# Patient Record
Sex: Female | Born: 1935 | Race: White | Hispanic: No | Marital: Married | Smoking: Never smoker
Health system: Southern US, Community
[De-identification: ages and names within clinical notes are randomized; demographics above are authoritative.]

## PROBLEM LIST (undated history)

## (undated) DIAGNOSIS — C569 Malignant neoplasm of unspecified ovary: Secondary | ICD-10-CM

## (undated) DIAGNOSIS — E785 Hyperlipidemia, unspecified: Secondary | ICD-10-CM

## (undated) DIAGNOSIS — E559 Vitamin D deficiency, unspecified: Secondary | ICD-10-CM

## (undated) DIAGNOSIS — Z923 Personal history of irradiation: Secondary | ICD-10-CM

## (undated) DIAGNOSIS — I10 Essential (primary) hypertension: Secondary | ICD-10-CM

## (undated) DIAGNOSIS — N189 Chronic kidney disease, unspecified: Secondary | ICD-10-CM

## (undated) DIAGNOSIS — M1990 Unspecified osteoarthritis, unspecified site: Secondary | ICD-10-CM

## (undated) DIAGNOSIS — Z860100 Personal history of colon polyps, unspecified: Secondary | ICD-10-CM

## (undated) DIAGNOSIS — E119 Type 2 diabetes mellitus without complications: Secondary | ICD-10-CM

## (undated) DIAGNOSIS — Z9221 Personal history of antineoplastic chemotherapy: Secondary | ICD-10-CM

## (undated) DIAGNOSIS — D649 Anemia, unspecified: Secondary | ICD-10-CM

## (undated) DIAGNOSIS — K219 Gastro-esophageal reflux disease without esophagitis: Secondary | ICD-10-CM

## (undated) DIAGNOSIS — Z8619 Personal history of other infectious and parasitic diseases: Secondary | ICD-10-CM

## (undated) DIAGNOSIS — Z5189 Encounter for other specified aftercare: Secondary | ICD-10-CM

## (undated) DIAGNOSIS — C50212 Malignant neoplasm of upper-inner quadrant of left female breast: Secondary | ICD-10-CM

## (undated) DIAGNOSIS — M109 Gout, unspecified: Secondary | ICD-10-CM

## (undated) DIAGNOSIS — T7840XA Allergy, unspecified, initial encounter: Secondary | ICD-10-CM

## (undated) HISTORY — DX: Gastro-esophageal reflux disease without esophagitis: K21.9

## (undated) HISTORY — DX: Type 2 diabetes mellitus without complications: E11.9

## (undated) HISTORY — PX: KNEE ARTHROSCOPY: SUR90

## (undated) HISTORY — PX: COLONOSCOPY: SHX174

## (undated) HISTORY — DX: Anemia, unspecified: D64.9

## (undated) HISTORY — DX: Allergy, unspecified, initial encounter: T78.40XA

## (undated) HISTORY — DX: Encounter for other specified aftercare: Z51.89

## (undated) HISTORY — PX: CARDIAC CATHETERIZATION: SHX172

## (undated) HISTORY — DX: Malignant neoplasm of upper-inner quadrant of left female breast: C50.212

## (undated) HISTORY — DX: Vitamin D deficiency, unspecified: E55.9

## (undated) HISTORY — DX: Unspecified osteoarthritis, unspecified site: M19.90

## (undated) HISTORY — DX: Hyperlipidemia, unspecified: E78.5

## (undated) HISTORY — DX: Essential (primary) hypertension: I10

## (undated) HISTORY — DX: Gout, unspecified: M10.9

## (undated) SURGERY — Surgical Case
Anesthesia: *Unknown

---

## 1971-12-02 HISTORY — PX: ABDOMINAL HYSTERECTOMY: SHX81

## 1999-11-05 ENCOUNTER — Encounter: Admission: RE | Admit: 1999-11-05 | Discharge: 1999-11-05 | Payer: Self-pay | Admitting: Internal Medicine

## 1999-11-05 ENCOUNTER — Encounter: Payer: Self-pay | Admitting: Internal Medicine

## 2000-11-05 ENCOUNTER — Encounter: Payer: Self-pay | Admitting: Internal Medicine

## 2000-11-05 ENCOUNTER — Encounter: Admission: RE | Admit: 2000-11-05 | Discharge: 2000-11-05 | Payer: Self-pay | Admitting: Internal Medicine

## 2002-03-28 ENCOUNTER — Encounter: Payer: Self-pay | Admitting: Internal Medicine

## 2002-03-28 ENCOUNTER — Encounter: Admission: RE | Admit: 2002-03-28 | Discharge: 2002-03-28 | Payer: Self-pay | Admitting: Internal Medicine

## 2002-03-31 ENCOUNTER — Encounter: Admission: RE | Admit: 2002-03-31 | Discharge: 2002-03-31 | Payer: Self-pay | Admitting: Internal Medicine

## 2002-03-31 ENCOUNTER — Encounter: Payer: Self-pay | Admitting: Internal Medicine

## 2003-05-16 ENCOUNTER — Encounter: Payer: Self-pay | Admitting: Internal Medicine

## 2003-05-16 ENCOUNTER — Encounter: Admission: RE | Admit: 2003-05-16 | Discharge: 2003-05-16 | Payer: Self-pay | Admitting: Internal Medicine

## 2004-07-10 ENCOUNTER — Encounter: Admission: RE | Admit: 2004-07-10 | Discharge: 2004-07-10 | Payer: Self-pay | Admitting: Internal Medicine

## 2004-12-01 HISTORY — PX: INCONTINENCE SURGERY: SHX676

## 2005-03-03 ENCOUNTER — Observation Stay (HOSPITAL_COMMUNITY): Admission: RE | Admit: 2005-03-03 | Discharge: 2005-03-04 | Payer: Self-pay | Admitting: Urology

## 2005-08-25 ENCOUNTER — Encounter: Admission: RE | Admit: 2005-08-25 | Discharge: 2005-08-25 | Payer: Self-pay | Admitting: Internal Medicine

## 2006-09-07 ENCOUNTER — Encounter: Admission: RE | Admit: 2006-09-07 | Discharge: 2006-09-07 | Payer: Self-pay | Admitting: Internal Medicine

## 2006-09-16 ENCOUNTER — Encounter: Admission: RE | Admit: 2006-09-16 | Discharge: 2006-09-16 | Payer: Self-pay | Admitting: Internal Medicine

## 2006-09-23 ENCOUNTER — Encounter: Admission: RE | Admit: 2006-09-23 | Discharge: 2006-09-23 | Payer: Self-pay | Admitting: Otolaryngology

## 2007-10-20 ENCOUNTER — Encounter: Admission: RE | Admit: 2007-10-20 | Discharge: 2007-10-20 | Payer: Self-pay | Admitting: Internal Medicine

## 2008-10-23 ENCOUNTER — Encounter: Admission: RE | Admit: 2008-10-23 | Discharge: 2008-10-23 | Payer: Self-pay | Admitting: Internal Medicine

## 2009-08-06 ENCOUNTER — Emergency Department (HOSPITAL_COMMUNITY): Admission: EM | Admit: 2009-08-06 | Discharge: 2009-08-06 | Payer: Self-pay | Admitting: Emergency Medicine

## 2009-08-20 ENCOUNTER — Ambulatory Visit (HOSPITAL_COMMUNITY): Admission: RE | Admit: 2009-08-20 | Discharge: 2009-08-20 | Payer: Self-pay | Admitting: Internal Medicine

## 2009-10-29 ENCOUNTER — Encounter: Admission: RE | Admit: 2009-10-29 | Discharge: 2009-10-29 | Payer: Self-pay | Admitting: Internal Medicine

## 2010-10-30 ENCOUNTER — Encounter: Admission: RE | Admit: 2010-10-30 | Discharge: 2010-10-30 | Payer: Self-pay | Admitting: Internal Medicine

## 2010-11-06 ENCOUNTER — Encounter
Admission: RE | Admit: 2010-11-06 | Discharge: 2010-11-06 | Payer: Self-pay | Source: Home / Self Care | Admitting: Internal Medicine

## 2010-12-01 HISTORY — PX: HAND SURGERY: SHX662

## 2011-02-10 ENCOUNTER — Other Ambulatory Visit (HOSPITAL_COMMUNITY): Payer: Self-pay | Admitting: Internal Medicine

## 2011-02-10 ENCOUNTER — Ambulatory Visit (HOSPITAL_COMMUNITY)
Admission: RE | Admit: 2011-02-10 | Discharge: 2011-02-10 | Disposition: A | Discharge status: Home / Self Care | Payer: Federal, State, Local not specified - PPO | Source: Ambulatory Visit | Attending: Internal Medicine | Admitting: Internal Medicine

## 2011-02-10 DIAGNOSIS — M79609 Pain in unspecified limb: Secondary | ICD-10-CM

## 2011-02-14 ENCOUNTER — Encounter (HOSPITAL_BASED_OUTPATIENT_CLINIC_OR_DEPARTMENT_OTHER)
Admission: RE | Admit: 2011-02-14 | Discharge: 2011-02-14 | Disposition: A | Discharge status: Home / Self Care | Payer: Federal, State, Local not specified - PPO | Source: Ambulatory Visit | Attending: Orthopedic Surgery | Admitting: Orthopedic Surgery

## 2011-02-14 ENCOUNTER — Ambulatory Visit
Admission: RE | Admit: 2011-02-14 | Discharge: 2011-02-14 | Disposition: A | Discharge status: Home / Self Care | Payer: Federal, State, Local not specified - PPO | Source: Ambulatory Visit | Attending: Orthopedic Surgery | Admitting: Orthopedic Surgery

## 2011-02-14 ENCOUNTER — Other Ambulatory Visit: Payer: Self-pay | Admitting: Orthopedic Surgery

## 2011-02-14 DIAGNOSIS — Z01811 Encounter for preprocedural respiratory examination: Secondary | ICD-10-CM

## 2011-02-14 LAB — BASIC METABOLIC PANEL
BUN: 23 mg/dL (ref 6–23)
CO2: 28 mEq/L (ref 19–32)
Chloride: 104 mEq/L (ref 96–112)
Glucose, Bld: 94 mg/dL (ref 70–99)
Potassium: 4.8 mEq/L (ref 3.5–5.1)

## 2011-02-17 ENCOUNTER — Ambulatory Visit (HOSPITAL_BASED_OUTPATIENT_CLINIC_OR_DEPARTMENT_OTHER)
Admission: RE | Admit: 2011-02-17 | Discharge: 2011-02-17 | Disposition: A | Discharge status: Home / Self Care | Payer: Federal, State, Local not specified - PPO | Source: Ambulatory Visit | Attending: Orthopedic Surgery | Admitting: Orthopedic Surgery

## 2011-02-17 DIAGNOSIS — Y92009 Unspecified place in unspecified non-institutional (private) residence as the place of occurrence of the external cause: Secondary | ICD-10-CM | POA: Insufficient documentation

## 2011-02-17 DIAGNOSIS — Z01812 Encounter for preprocedural laboratory examination: Secondary | ICD-10-CM | POA: Insufficient documentation

## 2011-02-17 DIAGNOSIS — X58XXXA Exposure to other specified factors, initial encounter: Secondary | ICD-10-CM | POA: Insufficient documentation

## 2011-02-17 DIAGNOSIS — Z01818 Encounter for other preprocedural examination: Secondary | ICD-10-CM | POA: Insufficient documentation

## 2011-02-17 DIAGNOSIS — S63659A Sprain of metacarpophalangeal joint of unspecified finger, initial encounter: Secondary | ICD-10-CM | POA: Insufficient documentation

## 2011-02-17 DIAGNOSIS — Y998 Other external cause status: Secondary | ICD-10-CM | POA: Insufficient documentation

## 2011-04-18 NOTE — Op Note (Signed)
NAME:  Kayla Baker, Kayla Baker                   ACCOUNT NO.:  [id number]   MEDICAL RECORD NO.:  [id number]          PATIENT TYPE:  AMB   LOCATION:  DAY                          FACILITY:  [facility]   PHYSICIAN:  Lucina Mellow. Terance Hart, M.D.DATE OF BIRTH:  10/13/1936   DATE OF PROCEDURE:  03/03/2005  DATE OF DISCHARGE:                                 OPERATIVE REPORT   HISTORY:  This 75 year old lady was brought the OR today for evaluation of  microhematuria and treatment of stress incontinence.  She has had some  microhematuria, and a renal ultrasound was unremarkable and cystoscopy was  also normal.  She has had a hysterectomy before and has classic stress  incontinence and was brought to the OR today for insertion of an obturator  sling, having been advised about the risks of retention, failure to correct  her incontinence, erosion, infection or bladder injury.   PROCEDURE:  Cystoscopy, bilateral retrograde pyelograms and insertion of  obturator sling.   SURGEON:  Lucina Mellow. Terance Hart, M.D.   ANESTHESIA:  General.   PROCEDURE:  The patient was brought to the operating room, placed lithotomy  position, external genitalia and lower abdomen and vaginal canal prepped and  draped in the usual fashion.  She was cystoscoped.  The bladder was totally  normal.  Bilateral retrograde pyelograms obtained using the cone-tip  catheter, and there was slight fullness in the left calyces, a little  __________ caliceal extravasation in the lower pole with the ureters  bilaterally fine, and there were no filling defects in either pyelocaliceal  system indicative of TCC.  Foley catheter was then inserted and the vaginal  tissues suburethrally were injected with Xylocaine with epinephrine.  A  midline incision was made the midurethra and dissected out laterally with  the Strully scissors to the endopelvic fascia.  Stab wounds were made at the  level of the clitoris laterally, through which subsequently passed the  curved  needles behind the obturator bone and through the endopelvic fascia  at the level of midurethra.  [facility] sling was brought up on  the left side without problems and then the corresponding curved needles  stick was made on the right side, and then she was cystoscoped.  There was  no evidence of bladder or urethral injury.  The sling was then brought up on  the right side and brought into position, and the blue tab was cut and the  sling covers were slid off without difficulty.  At this point the sling was  at the level of midurethra and a hemostat easily fit between the sling and  the urethra with the Foley catheter in place.  The ends of the sling were  cut off at skin level and they retracted, and these areas were later closed  with Dermabond.  The wound was irrigated with a double antibiotic solution.  At this point  the vaginal incision was closed with running 2-0 Vicryl.  An Estrace-soaked  vaginal pack was inserted, the Foley catheter was connected to closed  drainage.  She was taken to the recovery  room in good condition with the  correct sponge, needle and instrument count and estimated blood loss of less  than 25 mL.  She tolerated the procedure well and was taken to recovery room  in good condition.      LJP/MEDQ  D:  03/03/2005  T:  03/03/2005  Job:  [id number]   cc:   [name], M.D.  [address] [place]  [place], [place] [address]  Fax: [phone]

## 2011-06-20 NOTE — Op Note (Signed)
NAME:  Kayla Baker, Kayla Baker                   ACCOUNT NO.:  [id number]  MEDICAL RECORD NO.:  [id number]           PATIENT TYPE:  LOCATION:                                 FACILITY:  PHYSICIAN:  Daryll Brod, M.D.       DATE OF BIRTH:  21-Jun-1936  DATE OF PROCEDURE:  02/17/2011 DATE OF DISCHARGE:                              OPERATIVE REPORT   PREOPERATIVE DIAGNOSIS:  Subluxation extensor hood left middle finger.  POSTOPERATIVE DIAGNOSIS:  Subluxation extensor hood left middle finger.  OPERATION:  Centralization extensor hood, left middle finger.  SURGEON:  Daryll Brod, MD  ANESTHESIA:  General.  ANESTHESIOLOGIST:  [place], MD  HISTORY:  The patient is a 75 year old female with a history of injury to her left middle finger plucking something off the tip of her finger. She has complained of a pop, the inability to extend her finger.  She has subluxation of the extensor hood into the ulnar gutter.  She is admitted for reconstruction.  Pre, peri, and postoperative course have been discussed along with risks and complications.  She is aware that there is no guarantee with surgery, possibility of infection, recurrence injury to arteries, nerves, tendons, incomplete relief of symptoms, and dystrophy.  In the preoperative area, the patient is seen.  The extremity marked by both the patient and surgeon.  Antibiotic given.  PROCEDURE:  The patient was brought to the operating room where general anesthetic was carried out without difficulty.  She was prepped using ChloraPrep, supine position, left arm free.  A 3-minute dry time was allowed.  Time-out taken confirming the patient and procedure.  The limb was exsanguinated with an Esmarch bandage, tourniquet placed high and the arm was inflated to 250 mmHg.  A curvilinear incision was made apex radial over the metacarpophalangeal joint the left middle finger carried down through subcutaneous tissue.  Bleeders were electrocauterized  with bipolar.  Neurovascular structure was identified and protected.  The rupture of the sagittal fibers was immediately apparent.  The tendon was subluxated ulnarly.  Half of the juncture to the ring finger was then harvested along with a portion of the ulnar extensor tendon back to the level of the sagittal fiber rupture.  This was then passed through the extensor tendon and woven through the hood, sutured into position after irrigation with interrupted 4-0 Mersilene sutures.  The hood rupture itself was then repaired to the extensor tendon again with figure-of- eight 4-0 Mersilene sutures.  This maintained the finger and extensor tendon directly over the metacarpal head in full flexion and full extension.  The wound was copiously irrigated with saline.  The skin was then closed with a subcuticular 4-0 Vicryl Rapide suture.  A sterile compressive dressing splint to the wrist and fingers was applied.  On deflation of the tourniquet, all fingers immediately pinked.  She was taken to the recovery room for observation in satisfactory condition.  She will be discharged home to return to the [place] in 1 week on Vicodin.          ______________________________ Daryll Brod, M.D.  GK/MEDQ  D:  02/17/2011  T:  02/18/2011  Job:  [id number]  Electronically Signed by Daryll Brod M.D. on 06/20/2011 09:03:55 AM

## 2012-10-15 ENCOUNTER — Other Ambulatory Visit: Payer: Self-pay | Admitting: Internal Medicine

## 2012-10-15 DIAGNOSIS — Z1231 Encounter for screening mammogram for malignant neoplasm of breast: Secondary | ICD-10-CM

## 2012-11-16 ENCOUNTER — Ambulatory Visit: Payer: Federal, State, Local not specified - PPO

## 2012-12-01 HISTORY — PX: CATARACT EXTRACTION, BILATERAL: SHX1313

## 2012-12-08 ENCOUNTER — Ambulatory Visit
Admission: RE | Admit: 2012-12-08 | Discharge: 2012-12-08 | Disposition: A | Discharge status: Home / Self Care | Payer: Medicare Other | Source: Ambulatory Visit | Attending: Internal Medicine | Admitting: Internal Medicine

## 2012-12-08 DIAGNOSIS — Z1231 Encounter for screening mammogram for malignant neoplasm of breast: Secondary | ICD-10-CM

## 2012-12-08 LAB — HM MAMMOGRAPHY: HM Mammogram: NORMAL

## 2012-12-09 ENCOUNTER — Encounter: Payer: Self-pay | Admitting: Gastroenterology

## 2013-01-06 ENCOUNTER — Encounter: Payer: Self-pay | Admitting: Gastroenterology

## 2013-02-17 ENCOUNTER — Encounter: Payer: Medicare Other | Admitting: Gastroenterology

## 2013-03-17 ENCOUNTER — Ambulatory Visit (AMBULATORY_SURGERY_CENTER): Payer: Medicare Other | Admitting: *Deleted

## 2013-03-17 ENCOUNTER — Encounter: Payer: Self-pay | Admitting: Gastroenterology

## 2013-03-17 VITALS — Ht 64.0 in | Wt 150.0 lb

## 2013-03-17 DIAGNOSIS — Z1211 Encounter for screening for malignant neoplasm of colon: Secondary | ICD-10-CM

## 2013-03-17 MED ORDER — NA SULFATE-K SULFATE-MG SULF 17.5-3.13-1.6 GM/177ML PO SOLN: ORAL | Status: DC

## 2013-03-31 ENCOUNTER — Encounter: Payer: Self-pay | Admitting: Gastroenterology

## 2013-03-31 ENCOUNTER — Ambulatory Visit (AMBULATORY_SURGERY_CENTER): Payer: Medicare Other | Admitting: Gastroenterology

## 2013-03-31 ENCOUNTER — Other Ambulatory Visit: Payer: Self-pay | Admitting: Gastroenterology

## 2013-03-31 VITALS — BP 128/62 | HR 78 | Temp 97.5°F | Resp 16 | Ht 64.0 in | Wt 150.0 lb

## 2013-03-31 DIAGNOSIS — D126 Benign neoplasm of colon, unspecified: Secondary | ICD-10-CM

## 2013-03-31 DIAGNOSIS — K5730 Diverticulosis of large intestine without perforation or abscess without bleeding: Secondary | ICD-10-CM

## 2013-03-31 DIAGNOSIS — Z1211 Encounter for screening for malignant neoplasm of colon: Secondary | ICD-10-CM

## 2013-03-31 LAB — GLUCOSE, CAPILLARY: Glucose-Capillary: 92 mg/dL (ref 70–99)

## 2013-03-31 LAB — HM COLONOSCOPY

## 2013-03-31 MED ORDER — SODIUM CHLORIDE 0.9 % IV SOLN: 500.0000 mL | INTRAVENOUS | Status: DC

## 2013-03-31 NOTE — Patient Instructions (Addendum)
Discharge instructions given with verbal understanding. Handout on polyps and diverticulosis given. Resume previous medications. YOU HAD AN ENDOSCOPIC PROCEDURE TODAY AT [facility]: Refer to the procedure report that was given to you for any specific questions about what was found during the examination.  If the procedure report does not answer your questions, please call your gastroenterologist to clarify.  If you requested that your care partner not be given the details of your procedure findings, then the procedure report has been included in a sealed envelope for you to review at your convenience later.  YOU SHOULD EXPECT: Some feelings of bloating in the abdomen. Passage of more gas than usual.  Walking can help get rid of the air that was put into your GI tract during the procedure and reduce the bloating. If you had a lower endoscopy (such as a colonoscopy or flexible sigmoidoscopy) you may notice spotting of blood in your stool or on the toilet paper. If you underwent a bowel prep for your procedure, then you may not have a normal bowel movement for a few days.  DIET: Your first meal following the procedure should be a light meal and then it is ok to progress to your normal diet.  A half-sandwich or bowl of soup is an example of a good first meal.  Heavy or fried foods are harder to digest and may make you feel nauseous or bloated.  Likewise meals heavy in dairy and vegetables can cause extra gas to form and this can also increase the bloating.  Drink plenty of fluids but you should avoid alcoholic beverages for 24 hours.  ACTIVITY: Your care partner should take you home directly after the procedure.  You should plan to take it easy, moving slowly for the rest of the day.  You can resume normal activity the day after the procedure however you should NOT DRIVE or use heavy machinery for 24 hours (because of the sedation medicines used during the test).    SYMPTOMS TO REPORT  IMMEDIATELY: A gastroenterologist can be reached at any hour.  During normal business hours, 8:30 AM to 5:00 PM Monday through Friday, call [phone].  After hours and on weekends, please call the GI answering service at [phone] who will take a message and have the physician on call contact you.   Following lower endoscopy (colonoscopy or flexible sigmoidoscopy):  Excessive amounts of blood in the stool  Significant tenderness or worsening of abdominal pains  Swelling of the abdomen that is new, acute  Fever of 100F or higher  FOLLOW UP: If any biopsies were taken you will be contacted by phone or by letter within the next 1-3 weeks.  Call your gastroenterologist if you have not heard about the biopsies in 3 weeks.  Our staff will call the home number listed on your records the next business day following your procedure to check on you and address any questions or concerns that you may have at that time regarding the information given to you following your procedure. This is a courtesy call and so if there is no answer at the home number and we have not heard from you through the emergency physician on call, we will assume that you have returned to your regular daily activities without incident.  SIGNATURES/CONFIDENTIALITY: You and/or your care partner have signed paperwork which will be entered into your electronic medical record.  These signatures attest to the fact that that the information above on your After Visit  Summary has been reviewed and is understood.  Full responsibility of the confidentiality of this discharge information lies with you and/or your care-partner.

## 2013-03-31 NOTE — Progress Notes (Signed)
Stable to RR 

## 2013-03-31 NOTE — Progress Notes (Signed)
Called to room to assist during endoscopic procedure.  Patient ID and intended procedure confirmed with present staff. Received instructions for my participation in the procedure from the performing physician.  

## 2013-03-31 NOTE — Progress Notes (Signed)
Patient did not experience any of the following events: a burn prior to discharge; a fall within the facility; wrong site/side/patient/procedure/implant event; or a hospital transfer or hospital admission upon discharge from the facility. (G8907) Patient did not have preoperative order for IV antibiotic SSI prophylaxis. (G8918)  

## 2013-03-31 NOTE — Op Note (Signed)
[place]  [address] [place], [id number]   COLONOSCOPY PROCEDURE REPORT  PATIENT: Kayla Baker, Kayla Baker  MR#: [id number] BIRTHDATE: 07-26-1936 , 76  yrs. old GENDER: Female ENDOSCOPIST: Inda Castle, MD REFERRED BY: PROCEDURE DATE:  03/31/2013 PROCEDURE:   Colonoscopy with snare polypectomy ASA CLASS:   Class II INDICATIONS: MEDICATIONS: MAC sedation, administered by CRNA, Propofol (Diprivan), and Propofol (Diprivan) 120 mcg IV  DESCRIPTION OF PROCEDURE:   After the risks benefits and alternatives of the procedure were thoroughly explained, informed consent was obtained.  A digital rectal exam revealed external hemorrhoids.   The LB [id number]  endoscope was introduced through the anus and advanced to the cecum, which was identified by both the appendix and ileocecal valve. No adverse events experienced.   The quality of the prep was excellent using Suprep The instrument was then slowly withdrawn as the colon was fully examined.      COLON FINDINGS: A sessile polyp measuring 6 mm in size was found in the ascending colon.  A polypectomy was performed with a cold snare.  The resection was complete and the polyp tissue was completely retrieved.   Mild diverticulosis was noted.   The colon mucosa was otherwise normal.  Retroflexed views revealed no abnormalities. The time to cecum=3 minutes 57 seconds.  Withdrawal time=8 minutes 25 seconds.  The scope was withdrawn and the procedure completed. COMPLICATIONS: There were no complications.  ENDOSCOPIC IMPRESSION: 1.   Sessile polyp measuring 6 mm in size was found in the ascending colon; polypectomy was performed with a cold snare 2.   Mild diverticulosis was noted 3.   The colon mucosa was otherwise normal  RECOMMENDATIONS: If the polyp(s) removed today are proven to be adenomatous (pre-cancerous) polyps, you will need a repeat colonoscopy in 5 years.  Otherwise you should continue to follow  colorectal cancer screening guidelines for "routine risk" patients with colonoscopy in 10 years.  You will receive a letter within 1-2 weeks with the results of your biopsy as well as final recommendations.  Please call my office if you have not received a letter after 3 weeks.   eSigned:  Inda Castle, MD 03/31/2013 10:34 AM   cc:

## 2013-04-01 ENCOUNTER — Telehealth: Payer: Self-pay | Admitting: *Deleted

## 2013-04-01 NOTE — Telephone Encounter (Signed)
  Follow up Call-  Call back number 03/31/2013  Post procedure Call Back phone  # [phone]  Permission to leave phone message Yes     Patient questions:  Do you have a fever, pain , or abdominal swelling? no Pain Score  0 *  Have you tolerated food without any problems? yes  Have you been able to return to your normal activities? yes  Do you have any questions about your discharge instructions: Diet   no Medications  no Follow up visit  no  Do you have questions or concerns about your Care? no  Actions: * If pain score is 4 or above: No action needed, pain <4.

## 2013-04-08 ENCOUNTER — Encounter: Payer: Self-pay | Admitting: Gastroenterology

## 2013-10-21 ENCOUNTER — Other Ambulatory Visit: Payer: Self-pay | Admitting: Internal Medicine

## 2013-11-03 ENCOUNTER — Encounter: Payer: Self-pay | Admitting: Internal Medicine

## 2013-11-10 ENCOUNTER — Encounter: Payer: Self-pay | Admitting: Internal Medicine

## 2013-11-10 ENCOUNTER — Ambulatory Visit (INDEPENDENT_AMBULATORY_CARE_PROVIDER_SITE_OTHER): Payer: Medicare Other | Admitting: Internal Medicine

## 2013-11-10 VITALS — BP 134/84 | HR 80 | Temp 97.2°F | Resp 18 | Ht 63.75 in | Wt 147.0 lb

## 2013-11-10 DIAGNOSIS — M109 Gout, unspecified: Secondary | ICD-10-CM

## 2013-11-10 DIAGNOSIS — E1129 Type 2 diabetes mellitus with other diabetic kidney complication: Secondary | ICD-10-CM | POA: Insufficient documentation

## 2013-11-10 DIAGNOSIS — E119 Type 2 diabetes mellitus without complications: Secondary | ICD-10-CM | POA: Insufficient documentation

## 2013-11-10 DIAGNOSIS — Z1212 Encounter for screening for malignant neoplasm of rectum: Secondary | ICD-10-CM

## 2013-11-10 DIAGNOSIS — R7309 Other abnormal glucose: Secondary | ICD-10-CM

## 2013-11-10 DIAGNOSIS — I10 Essential (primary) hypertension: Secondary | ICD-10-CM

## 2013-11-10 DIAGNOSIS — E782 Mixed hyperlipidemia: Secondary | ICD-10-CM

## 2013-11-10 DIAGNOSIS — E559 Vitamin D deficiency, unspecified: Secondary | ICD-10-CM

## 2013-11-10 DIAGNOSIS — Z79899 Other long term (current) drug therapy: Secondary | ICD-10-CM

## 2013-11-10 DIAGNOSIS — E1169 Type 2 diabetes mellitus with other specified complication: Secondary | ICD-10-CM | POA: Insufficient documentation

## 2013-11-10 LAB — HEPATIC FUNCTION PANEL
ALT: 28 U/L (ref 0–35)
AST: 23 U/L (ref 0–37)
Alkaline Phosphatase: 122 U/L — ABNORMAL HIGH (ref 39–117)
Bilirubin, Direct: 0.1 mg/dL (ref 0.0–0.3)
Total Bilirubin: 0.6 mg/dL (ref 0.3–1.2)
Total Protein: 7.1 g/dL (ref 6.0–8.3)

## 2013-11-10 LAB — LIPID PANEL
Cholesterol: 155 mg/dL (ref 0–200)
LDL Cholesterol: 75 mg/dL (ref 0–99)
Total CHOL/HDL Ratio: 2.8 Ratio
VLDL: 24 mg/dL (ref 0–40)

## 2013-11-10 LAB — CBC WITH DIFFERENTIAL/PLATELET
Basophils Relative: 1 % (ref 0–1)
Eosinophils Absolute: 0.3 10*3/uL (ref 0.0–0.7)
Eosinophils Relative: 4 % (ref 0–5)
HCT: 39 % (ref 36.0–46.0)
Hemoglobin: 13.3 g/dL (ref 12.0–15.0)
Lymphs Abs: 2.9 10*3/uL (ref 0.7–4.0)
MCH: 30 pg (ref 26.0–34.0)
MCHC: 34.1 g/dL (ref 30.0–36.0)
MCV: 87.8 fL (ref 78.0–100.0)
Monocytes Absolute: 0.6 10*3/uL (ref 0.1–1.0)
Monocytes Relative: 9 % (ref 3–12)
Neutrophils Relative %: 45 % (ref 43–77)
RBC: 4.44 MIL/uL (ref 3.87–5.11)
RDW: 14.9 % (ref 11.5–15.5)
WBC: 7.1 10*3/uL (ref 4.0–10.5)

## 2013-11-10 LAB — HEMOGLOBIN A1C
Hgb A1c MFr Bld: 6.3 % — ABNORMAL HIGH (ref <5.7)
Mean Plasma Glucose: 134 mg/dL — ABNORMAL HIGH (ref <117)

## 2013-11-10 LAB — BASIC METABOLIC PANEL WITH GFR
BUN: 25 mg/dL — ABNORMAL HIGH (ref 6–23)
CO2: 31 mEq/L (ref 19–32)
Calcium: 10.5 mg/dL (ref 8.4–10.5)
GFR, Est African American: 50 mL/min — ABNORMAL LOW
Glucose, Bld: 102 mg/dL — ABNORMAL HIGH (ref 70–99)
Potassium: 4.6 mEq/L (ref 3.5–5.3)
Sodium: 139 mEq/L (ref 135–145)

## 2013-11-10 LAB — TSH: TSH: 3.275 u[IU]/mL (ref 0.350–4.500)

## 2013-11-10 NOTE — Progress Notes (Signed)
Patient ID: Kayla Baker, female   DOB: May 17, 1936, 77 y.o.   MRN: [id number]  Annual Screening Comprehensive Examination  This very nice 77 yo MWF presents for complete physical.  Patient has been followed for HTN (2000), Prediabetes (10/2010), Gout, Hyperlipidemia, and Vitamin D Deficiency.   Patient's BP has been controlled at home. Patient denies any cardiac symptoms as chest pain, palpitations, shortness of breath, dizziness or ankle swelling.   Patient's hyperlipidemia is controlled with diet and medications. Patient denies myalgias or other medication SE's. Last cholesterol last visit was 175, triglycerides  224, HDL 51 and LDL   79 in September - at goal.     Patient has prediabetes predating since December 2011 with last A1c 6.7% in September. Patient denies reactive hypoglycemic symptoms, visual blurring, diabetic polys, or paresthesias.    Finally, patient has history of Vitamin D Deficiency with last vitamin D 71 in June.     Current Outpatient Prescriptions on File Prior to Visit  Medication Sig Dispense Refill  . allopurinol (ZYLOPRIM) 300 MG tablet Take 300 mg by mouth daily. Takes 1/2 tablet daily      . amitriptyline (ELAVIL) 10 MG tablet Take 10 mg by mouth at bedtime. Takes 2 tablets at bedtime      . aspirin 81 MG tablet Take 81 mg by mouth daily.      Marland Kitchen atorvastatin (LIPITOR) 40 MG tablet Take 40 mg by mouth daily.      . cholecalciferol (VITAMIN D) 1000 UNITS tablet Take 1,000 Units by mouth daily.      . enalapril (VASOTEC) 20 MG tablet Take 20 mg by mouth daily.      Marland Kitchen estradiol (ESTRACE) 1 MG tablet Take 1 mg by mouth daily. Takes 1/2 tablet daily      . fluticasone (FLONASE) 50 MCG/ACT nasal spray       . hydrochlorothiazide (HYDRODIURIL) 25 MG tablet TAKE 1 TABLET BY MOUTH DAILY  100 tablet  1  . Magnesium 250 MG TABS Take by mouth. Takes 1 tablet in am and 2 tablets at bedtime      . Multiple Vitamin (MULTIVITAMIN) tablet Take 1 tablet by mouth daily.      .  Omega-3 Fatty Acids (FISH OIL) 1200 MG CAPS Take by mouth daily. Takes 2 capsules daily      . omeprazole (PRILOSEC) 40 MG capsule Take 40 mg by mouth daily.      . vitamin B-12 (CYANOCOBALAMIN) 1000 MCG tablet Take 1,000 mcg by mouth daily. Takes 1 tablet every other day      . vitamin C (ASCORBIC ACID) 500 MG tablet Take 500 mg by mouth daily. Takes 2 tablets daily         Allergies  Allergen Reactions  . Keflex [Cephalexin] Swelling    Tongue and throat swells  . Meloxicam   . Prednisone     High doses  . Premarin [Estrogens Conjugated]   . Trovan [Alatrofloxacin]     Past Medical History  Diagnosis Date  . Hypertension   . Hyperlipidemia   . GERD (gastroesophageal reflux disease)   . Gout   . Allergy   . Diet-controlled type 2 diabetes mellitus   . Vitamin D deficiency   . Anemia     Past Surgical History  Procedure Laterality Date  . Cataract extraction, bilateral  2014    right eye 2/14; left eye 3/3  . Abdominal hysterectomy/App w/o BSO  1979  . Incontinence surgery  2006  .  Knee arthroscopy      [id number] right  . Hand surgery  2012    left    Family History  Problem Relation Age of Onset  . Colon cancer Neg Hx   . Stomach cancer Neg Hx   . Stroke Mother   . Diabetes Father   . Hypertension Sister   . Cancer Sister   . Cancer Sister     History  Substance Use Topics  . Smoking status: Never Smoker   . Smokeless tobacco: Never Used  . Alcohol Use: No    ROS Constitutional: Denies fever, chills, weight loss/gain, headaches, insomnia, fatigue, night sweats, and change in appetite. Eyes: Denies redness, blurred vision, diplopia, discharge, itchy, watery eyes.  ENT: Denies discharge, congestion, post nasal drip, epistaxis, sore throat, earache, hearing loss, dental pain, Tinnitus, Vertigo, Sinus pain, snoring.  Cardio: Denies chest pain, palpitations, irregular heartbeat, syncope, dyspnea, diaphoresis, orthopnea, PND, claudication, edema Respiratory:  denies cough, dyspnea, DOE, pleurisy, hoarseness, laryngitis, wheezing.  Gastrointestinal: Denies dysphagia, heartburn, reflux, water brash, pain, cramps, nausea, vomiting, bloating, diarrhea, constipation, hematemesis, melena, hematochezia, jaundice, hemorrhoids Genitourinary: Denies dysuria, frequency, urgency, nocturia, hesitancy, discharge, hematuria, flank pain Breast:Breast lumps, nipple discharge, bleeding.  Musculoskeletal: Denies arthralgia, myalgia, stiffness, Jt. Swelling, pain, limp, and strain/sprain. Skin: Denies puritis, rash, hives, warts, acne, eczema, changing in skin lesion Neuro: No weakness, tremor, incoordination, spasms, paresthesia, pain Psychiatric: Denies confusion, memory loss, sensory loss Endocrine: Denies change in weight, skin, hair change, nocturia, and paresthesia, diabetic polys, visual blurring, hyper / hypo glycemic episodes.  Heme/Lymph: No excessive bleeding, bruising, enlarged lymph nodes.  Filed Vitals:   11/10/13 0917  BP: 134/84  Pulse: 80  Temp: 97.2 F (36.2 C)  Resp: 18    Estimated body mass index is 25.44 kg/(m^2) as calculated from the following:   Height as of this encounter: 5' 3.75" (1.619 m).   Weight as of this encounter: 147 lb (66.679 kg).  Physical Exam General Appearance: Well nourished, in no apparent distress. Eyes: PERRLA, EOMs, conjunctiva no swelling or erythema, normal fundi and vessels. Sinuses: No frontal/maxillary tenderness ENT/Mouth: EACs patent / TMs  nl. Nares clear without erythema, swelling, mucoid exudates. Oral hygiene is good. No erythema, swelling, or exudate. Tongue normal, non-obstructing. Tonsils not swollen or erythematous. Hearing normal.  Neck: Supple, thyroid normal. No bruits, nodes or JVD. Respiratory: Respiratory effort normal.  BS equal and clear bilateral without rales, rhonci, wheezing or stridor. Cardio: Heart sounds are normal with regular rate and rhythm and no murmurs, rubs or gallops.  Peripheral pulses are normal and equal bilaterally without edema. No aortic or femoral bruits. Chest: symmetric with normal excursions and percussion. Breasts: Symmetric, without lumps, nipple discharge, retractions, or fibrocystic changes.  Abdomen: Flat, soft, with bowl sounds. Nontender, no guarding, rebound, hernias, masses, or organomegaly.  Lymphatics: Non tender without lymphadenopathy.  Genitourinary:  Musculoskeletal: Full ROM all peripheral extremities, joint stability, 5/5 strength, and normal gait. Skin: Warm and dry without rashes, lesions, cyanosis, clubbing or  ecchymosis.  Neuro: Cranial nerves intact, reflexes equal bilaterally. Normal muscle tone, no cerebellar symptoms. Sensation intact.  Pysch: Awake and oriented X 3, normal affect, Insight and Judgment appropriate.   Assessment and Plan  1. Annual Screening Examination 2. Hypertension  3. Hyperlipidemia 4. Pre Diabetes 5. Vitamin D Deficiency  Continue prudent diet as discussed, weight control, BP monitoring, regular exercise, and medications. Discussed med's effects and SE's. Screening labs and tests as requested with regular follow-up as recommended.

## 2013-11-10 NOTE — Patient Instructions (Signed)

## 2013-11-11 ENCOUNTER — Telehealth: Payer: Self-pay | Admitting: *Deleted

## 2013-11-11 LAB — URINALYSIS, MICROSCOPIC ONLY
Bacteria, UA: NONE SEEN
Casts: NONE SEEN
Crystals: NONE SEEN

## 2013-11-11 LAB — MICROALBUMIN / CREATININE URINE RATIO
Creatinine, Urine: 32.4 mg/dL
Microalb Creat Ratio: 15.4 mg/g (ref 0.0–30.0)
Microalb, Ur: 0.5 mg/dL (ref 0.00–1.89)

## 2013-11-11 LAB — VITAMIN D 25 HYDROXY (VIT D DEFICIENCY, FRACTURES): Vit D, 25-Hydroxy: 61 ng/mL (ref 30–89)

## 2013-11-11 NOTE — Telephone Encounter (Signed)
Message copied by Emelda Brothers on Fri Nov 11, 2013 10:16 AM ------      Message from: [name]      Created: Fri Nov 11, 2013  9:29 AM       Chol 155 - ldl 75 hdl 56 - excellent very low risk for ht attack      bmet suggests not drinking enough water-      A1c 6.3% early diabetic range - avoid sweets/ candy and white stuff -      All else normal and OK ------

## 2013-11-14 ENCOUNTER — Encounter: Payer: Self-pay | Admitting: Internal Medicine

## 2013-11-21 ENCOUNTER — Other Ambulatory Visit: Payer: PPO | Admitting: Internal Medicine

## 2013-11-21 DIAGNOSIS — Z1212 Encounter for screening for malignant neoplasm of rectum: Secondary | ICD-10-CM

## 2013-11-21 LAB — POC HEMOCCULT BLD/STL (HOME/3-CARD/SCREEN)
Card #2 Fecal Occult Blod, POC: NEGATIVE
Card #3 Fecal Occult Blood, POC: NEGATIVE
Fecal Occult Blood, POC: NEGATIVE

## 2014-01-30 ENCOUNTER — Other Ambulatory Visit: Payer: Self-pay

## 2014-01-30 DIAGNOSIS — Z1231 Encounter for screening mammogram for malignant neoplasm of breast: Secondary | ICD-10-CM

## 2014-02-13 ENCOUNTER — Encounter: Payer: Self-pay | Admitting: Physician Assistant

## 2014-02-13 ENCOUNTER — Ambulatory Visit (INDEPENDENT_AMBULATORY_CARE_PROVIDER_SITE_OTHER): Payer: Medicare Other | Admitting: Physician Assistant

## 2014-02-13 VITALS — BP 102/62 | HR 88 | Temp 97.5°F | Resp 16 | Ht 63.5 in | Wt 147.0 lb

## 2014-02-13 DIAGNOSIS — R7309 Other abnormal glucose: Secondary | ICD-10-CM

## 2014-02-13 DIAGNOSIS — E559 Vitamin D deficiency, unspecified: Secondary | ICD-10-CM

## 2014-02-13 DIAGNOSIS — I10 Essential (primary) hypertension: Secondary | ICD-10-CM

## 2014-02-13 DIAGNOSIS — E782 Mixed hyperlipidemia: Secondary | ICD-10-CM

## 2014-02-13 DIAGNOSIS — E2839 Other primary ovarian failure: Secondary | ICD-10-CM

## 2014-02-13 DIAGNOSIS — Z0000 Encounter for general adult medical examination without abnormal findings: Secondary | ICD-10-CM

## 2014-02-13 DIAGNOSIS — E669 Obesity, unspecified: Secondary | ICD-10-CM

## 2014-02-13 DIAGNOSIS — Z79899 Other long term (current) drug therapy: Secondary | ICD-10-CM

## 2014-02-13 DIAGNOSIS — Z1331 Encounter for screening for depression: Secondary | ICD-10-CM

## 2014-02-13 LAB — LIPID PANEL
CHOLESTEROL: 170 mg/dL (ref 0–200)
HDL: 54 mg/dL (ref >39)
LDL CALC: 80 mg/dL (ref 0–99)
Total CHOL/HDL Ratio: 3.1 Ratio
Triglycerides: 182 mg/dL — ABNORMAL HIGH (ref <150)
VLDL: 36 mg/dL (ref 0–40)

## 2014-02-13 LAB — CBC WITH DIFFERENTIAL/PLATELET
BASOS PCT: 1 % (ref 0–1)
Basophils Absolute: 0.1 10*3/uL (ref 0.0–0.1)
Eosinophils Absolute: 0.3 10*3/uL (ref 0.0–0.7)
Eosinophils Relative: 4 % (ref 0–5)
HCT: 39.3 % (ref 36.0–46.0)
Hemoglobin: 13.5 g/dL (ref 12.0–15.0)
Lymphocytes Relative: 35 % (ref 12–46)
Lymphs Abs: 2.3 10*3/uL (ref 0.7–4.0)
MCH: 29.6 pg (ref 26.0–34.0)
MCHC: 34.4 g/dL (ref 30.0–36.0)
MCV: 86.2 fL (ref 78.0–100.0)
MONO ABS: 0.6 10*3/uL (ref 0.1–1.0)
Monocytes Relative: 9 % (ref 3–12)
Neutro Abs: 3.4 10*3/uL (ref 1.7–7.7)
Neutrophils Relative %: 51 % (ref 43–77)
Platelets: 211 10*3/uL (ref 150–400)
RBC: 4.56 MIL/uL (ref 3.87–5.11)
RDW: 15.1 % (ref 11.5–15.5)
WBC: 6.7 10*3/uL (ref 4.0–10.5)

## 2014-02-13 LAB — HEPATIC FUNCTION PANEL
ALT: 22 U/L (ref 0–35)
AST: 21 U/L (ref 0–37)
Albumin: 4.5 g/dL (ref 3.5–5.2)
Alkaline Phosphatase: 93 U/L (ref 39–117)
BILIRUBIN INDIRECT: 0.5 mg/dL (ref 0.2–1.2)
Bilirubin, Direct: 0.1 mg/dL (ref 0.0–0.3)
Total Bilirubin: 0.6 mg/dL (ref 0.2–1.2)
Total Protein: 7 g/dL (ref 6.0–8.3)

## 2014-02-13 LAB — BASIC METABOLIC PANEL WITH GFR
BUN: 23 mg/dL (ref 6–23)
CALCIUM: 10.2 mg/dL (ref 8.4–10.5)
CHLORIDE: 102 meq/L (ref 96–112)
CO2: 30 mEq/L (ref 19–32)
Creat: 1.33 mg/dL — ABNORMAL HIGH (ref 0.50–1.10)
GFR, Est African American: 45 mL/min — ABNORMAL LOW
GFR, Est Non African American: 39 mL/min — ABNORMAL LOW
GLUCOSE: 99 mg/dL (ref 70–99)
Potassium: 4.2 mEq/L (ref 3.5–5.3)
Sodium: 140 mEq/L (ref 135–145)

## 2014-02-13 LAB — HEMOGLOBIN A1C
Hgb A1c MFr Bld: 6.3 % — ABNORMAL HIGH (ref <5.7)
MEAN PLASMA GLUCOSE: 134 mg/dL — AB (ref <117)

## 2014-02-13 LAB — TSH: TSH: 2.706 u[IU]/mL (ref 0.350–4.500)

## 2014-02-13 LAB — MAGNESIUM: Magnesium: 1.9 mg/dL (ref 1.5–2.5)

## 2014-02-13 NOTE — Progress Notes (Signed)
Subjective:   Kayla Baker is a 78 y.o. female who presents for Medicare Annual Wellness Visit and 3 month follow up on hypertension, prediabetes, hyperlipidemia, vitamin D def.  Date of last medicare wellness visit is unknown.   Her blood pressure has been controlled at home, today their BP is BP: 102/62 mmHg She does workout, goes to the [facility] for water classes and currently remodeling house. She denies chest pain, shortness of breath, dizziness.  She is on cholesterol medication and denies myalgias. Her cholesterol is at goal. The cholesterol last visit was:   Lab Results  Component Value Date   CHOL 155 11/10/2013   HDL 56 11/10/2013   LDLCALC 75 11/10/2013   TRIG 119 11/10/2013   CHOLHDL 2.8 11/10/2013   She has been working on diet and exercise for prediabetes, and denies foot ulcerations, hypoglycemia , nausea, polydipsia and polyuria. Last A1C in the office was:  Lab Results  Component Value Date   HGBA1C 6.3* 11/10/2013  + Paraesthesias in feet, takes two amitriptyline at night that helps.  Patient is on Vitamin D supplement.  Names of Other Physician/Practitioners you currently use: 1. [facility]- here for primary care 2. Dr. Luretha Rued, Dr. Talbert Forest, eye doctor, last visit Oct 2014, readers 3. Dr. Jolayne Panther, dentist, last visit 02/08/2014 4. Dr. Deatra Ina Patient Care Team: [name], MD as PCP - General  Medication Review Current Outpatient Prescriptions on File Prior to Visit  Medication Sig Dispense Refill  . allopurinol (ZYLOPRIM) 300 MG tablet Take 300 mg by mouth daily. Takes 1/2 tablet daily      . amitriptyline (ELAVIL) 10 MG tablet Take 10 mg by mouth at bedtime. Takes 2 tablets at bedtime      . aspirin 81 MG tablet Take 81 mg by mouth daily.      Marland Kitchen atorvastatin (LIPITOR) 40 MG tablet Take 40 mg by mouth daily.      . cholecalciferol (VITAMIN D) 1000 UNITS tablet Take 1,000 Units by mouth daily.      . enalapril (VASOTEC) 20 MG  tablet Take 20 mg by mouth daily.      Marland Kitchen estradiol (ESTRACE) 1 MG tablet Take 1 mg by mouth daily. Takes 1/2 tablet daily      . hydrochlorothiazide (HYDRODIURIL) 25 MG tablet TAKE 1 TABLET BY MOUTH DAILY pt takes 1/2-1tab qd      . Magnesium 250 MG TABS Take 250 mg by mouth 2 (two) times daily. Takes 2 tabs in am and 2 tabs in pm      . Multiple Vitamin (MULTIVITAMIN) tablet Take 1 tablet by mouth daily.      . Omega-3 Fatty Acids (FISH OIL) 1200 MG CAPS Take by mouth daily. Takes 2 capsules daily      . omeprazole (PRILOSEC) 40 MG capsule Take 40 mg by mouth daily.      . vitamin B-12 (CYANOCOBALAMIN) 1000 MCG tablet Take 1,000 mcg by mouth daily. Takes 1 tablet every other day      . vitamin C (ASCORBIC ACID) 500 MG tablet Take 500 mg by mouth daily. Takes 2 tablets daily       No current facility-administered medications on file prior to visit.    Current Problems (verified) Patient Active Problem List   Diagnosis Date Noted  . Unspecified essential hypertension 11/10/2013  . Mixed hyperlipidemia 11/10/2013  . Other abnormal glucose 11/10/2013  . Unspecified vitamin D deficiency 11/10/2013    Screening Tests Health Maintenance  Topic Date Due  . Tetanus/tdap  04/12/1955  . Influenza Vaccine  07/01/2014  . Mammogram  12/08/2014  . Colonoscopy  03/31/2018  . Pneumococcal Polysaccharide Vaccine Age 40 And Over  Completed  . Zostavax  Completed     Immunization History  Administered Date(s) Administered  . DTaP 07/01/2004  . Influenza Whole 08/15/2013  . Pneumococcal Polysaccharide-23 11/01/2009  . Td 08/10/2009  . Zoster 08/02/2011    Preventative care: Last colonoscopy: 03/2013 due 5 years Last mammogram: 12/08/2012 Last pap smear/pelvic exam: remote a/p TAH DEXA: Schedule   Prior vaccinations: TD or Tdap: 2010  Influenza: 2014 Pneumococcal: 2010 Shingles/Zostavax: 2012  History reviewed: allergies, current medications, past family history, past medical  history, past social history, past surgical history and problem list  Risk Factors: Osteoporosis: postmenopausal estrogen deficiency and dietary calcium and/or vitamin D deficiency History of fracture in the past year: no  Tobacco History  Substance Use Topics  . Smoking status: Never Smoker   . Smokeless tobacco: Never Used  . Alcohol Use: No   She does not smoke.  Patient is not a former smoker. Are there smokers in your home (other than you)?  No  Alcohol Current alcohol use: none  Caffeine Current caffeine use: coffee 1-2 /day  Exercise Exercise limitations: None Current exercise: aerobics, swimming and yard work  Nutrition/Diet Current diet: in general, a "healthy" diet    Cardiac risk factors: advanced age (older than 24 for men, 69 for women), diabetes mellitus, dyslipidemia and hypertension.  Depression Screen Nurse depression screen reviewed.  (Note: if answer to either of the following is "Yes", a more complete depression screening is indicated)   Q1: Over the past two weeks, have you felt down, depressed or hopeless? No  Q2: Over the past two weeks, have you felt little interest or pleasure in doing things? No  Have you lost interest or pleasure in daily life? No  Do you often feel hopeless? No  Do you cry easily over simple problems? No  Activities of Daily Living Nurse ADLs screen reviewed.  In your present state of health, do you have any difficulty performing the following activities?:  Driving? No Managing money?  No Feeding yourself? No Getting from bed to chair? No Climbing a flight of stairs? No Preparing food and eating?: No Bathing or showering? No Getting dressed: No Getting to the toilet? No Using the toilet:No Moving around from place to place: No In the past year have you fallen or had a near fall?:No   Are you sexually active?  Yes  Do you have more than one partner?  No  Vision Difficulties: No  Hearing Difficulties: No Do  you often ask people to speak up or repeat themselves? No Do you experience ringing or noises in your ears? No Do you have difficulty understanding soft or whispered voices? No  Cognition  Do you feel that you have a problem with memory?No  Do you often misplace items? No  Do you feel safe at home?  Yes  Advanced directives Does patient have a [place]? Yes Does patient have a Living Will? No   Objective:     Vision and hearing screens reviewed.   Blood pressure 102/62, pulse 88, temperature 97.5 F (36.4 C), resp. rate 16, height 5' 3.5" (1.613 m), weight 147 lb (66.679 kg). Body mass index is 25.63 kg/(m^2).  General appearance: alert, no distress, WD/WN,  female Cognitive Testing  Alert? Yes  Normal Appearance?Yes  Oriented to person? Yes  Place? Yes   Time? Yes  Recall of three objects?  Yes  Can perform simple calculations? Yes  Displays appropriate judgment?Yes  Can read the correct time from a watch face?Yes  HEENT: normocephalic, sclerae anicteric, TMs pearly, nares patent, no discharge or erythema, pharynx normal Oral cavity: MMM, no lesions Neck: supple, no lymphadenopathy, no thyromegaly, no masses Heart: RRR, normal S1, S2, no murmurs Lungs: CTA bilaterally, no wheezes, rhonchi, or rales Abdomen: +bs, soft, non tender, non distended, no masses, no hepatomegaly, no splenomegaly Musculoskeletal: nontender, no swelling, no obvious deformity Extremities: no edema, no cyanosis, no clubbing Pulses: 2+ symmetric, upper and lower extremities, normal cap refill Neurological: alert, oriented x 3, CN2-12 intact, strength normal upper extremities and lower extremities, sensation decreased bilateral feet, does not extend , DTRs 2+ throughout, no cerebellar signs, gait normal Psychiatric: normal affect, behavior normal, pleasant  Breast: defer Gyn: defer Rectal: defer   Assessment:   1. Unspecified essential hypertension - CBC with Differential -  BASIC METABOLIC PANEL WITH GFR - Hepatic function panel - TSH -- continue medications, DASH diet, exercise and monitor at home. Call if greater than 130/80.   2. Mixed hyperlipidemia - Lipid panel -continue medications, check lipids, decrease fatty foods, increase activity.   3. Other abnormal glucose - Hemoglobin A1c - HM DIABETES FOOT EXAM Discussed general issues about diabetes pathophysiology and management., Educational material distributed., Suggested low cholesterol diet., Encouraged aerobic exercise., Discussed foot care., Reminded to get yearly retinal exam.  4. Unspecified vitamin D deficiency - Vit D  25 hydroxy (rtn osteoporosis monitoring)  5. Estrogen deficiency - DG Bone Density; Future  6. Encounter for long-term (current) use of other medications - Magnesium  7. Screening for depression Negative  8. DM neuropathy Continue medications, discussed daily foot exams.    Plan:   During the course of the visit the patient was educated and counseled about appropriate screening and preventive services including:    Screening electrocardiogram  Screening mammography  Bone densitometry screening  Colorectal cancer screening  Diabetes screening  Glaucoma screening  Nutrition counseling   Screening recommendations, referrals:  Vaccinations: Tdap vaccine not done  Influenza vaccine not done Pneumococcal vaccine not done Shingles vaccine not done Hep B vaccine not done  Nutrition assessed and recommended  Colonoscopy not done Mammogram yes Pap smear no Pelvic exam no Recommended yearly ophthalmology/optometry visit for glaucoma screening and checkup Recommended yearly dental visit for hygiene and checkup Advanced directives - no  Conditions/risks identified: BMI: Discussed weight loss, diet, and increase physical activity.  Increase physical activity: AHA recommends 150 minutes of physical activity a week.  Medications reviewed DEXA-  ordered Diabetes is at goal, ACE/ARB therapy: Yes. Urinary Incontinence is not an issue: discussed non pharmacology and pharmacology options.  Fall risk: low- discussed PT, home fall assessment, medications.   Medicare Attestation I have personally reviewed: The patient's medical and social history Their use of alcohol, tobacco or illicit drugs Their current medications and supplements The patient's functional ability including ADLs,fall risks, home safety risks, cognitive, and hearing and visual impairment Diet and physical activities Evidence for depression or mood disorders  The patient's weight, height, BMI, and visual acuity have been recorded in the chart.  I have made referrals, counseling, and provided education to the patient based on review of the above and I have provided the patient with a written personalized care plan for preventive services.     Vicie Mutters, PA-C   02/13/2014

## 2014-02-13 NOTE — Patient Instructions (Signed)
Preventative Care for Adults - Female      MAINTAIN REGULAR HEALTH EXAMS:  A routine yearly physical is a good way to check in with your primary care provider about your health and preventive screening. It is also an opportunity to share updates about your health and any concerns you have, and receive a thorough all-over exam.   Most health insurance companies pay for at least some preventative services.  Check with your health plan for specific coverages.  WHAT PREVENTATIVE SERVICES DO WOMEN NEED?  Adult women should have their weight and blood pressure checked regularly.   Women age 76 and older should have their cholesterol levels checked regularly.  Women should be screened for cervical cancer with a Pap smear and pelvic exam beginning at either age 76, or 3 years after they become sexually activity.    Breast cancer screening generally begins at age 76 with a mammogram and breast exam by your primary care provider.    Beginning at age 76 and continuing to age 26, women should be screened for colorectal cancer.  Certain people may need continued testing until age 107.  Updating vaccinations is part of preventative care.  Vaccinations help protect against diseases such as the flu.  Osteoporosis is a disease in which the bones lose minerals and strength as we age. Women ages 33 and over should discuss this with their caregivers, as should women after menopause who have other risk factors.  Lab tests are generally done as part of preventative care to screen for anemia and blood disorders, to screen for problems with the kidneys and liver, to screen for bladder problems, to check blood sugar, and to check your cholesterol level.  Preventative services generally include counseling about diet, exercise, avoiding tobacco, drugs, excessive alcohol consumption, and sexually transmitted infections.    GENERAL RECOMMENDATIONS FOR GOOD HEALTH:  Healthy diet:  Eat a variety of foods, including  fruit, vegetables, animal or vegetable protein, such as meat, fish, chicken, and eggs, or beans, lentils, tofu, and grains, such as rice.  Drink plenty of water daily.  Decrease saturated fat in the diet, avoid lots of red meat, processed foods, sweets, fast foods, and fried foods.  Exercise:  Aerobic exercise helps maintain good heart health. At least 30-40 minutes of moderate-intensity exercise is recommended. For example, a brisk walk that increases your heart rate and breathing. This should be done on most days of the week.   Find a type of exercise or a variety of exercises that you enjoy so that it becomes a part of your daily life.  Examples are running, walking, swimming, water aerobics, and biking.  For motivation and support, explore group exercise such as aerobic class, spin class, Zumba, Yoga,or  martial arts, etc.    Set exercise goals for yourself, such as a certain weight goal, walk or run in a race such as a 5k walk/run.  Speak to your primary care provider about exercise goals.  Disease prevention:  If you smoke or chew tobacco, find out from your caregiver how to quit. It can literally save your life, no matter how long you have been a tobacco user. If you do not use tobacco, never begin.   Maintain a healthy diet and normal weight. Increased weight leads to problems with blood pressure and diabetes.   The Body Mass Index or BMI is a way of measuring how much of your body is fat. Having a BMI above 27 increases the risk of heart disease,  diabetes, hypertension, stroke and other problems related to obesity. Your caregiver can help determine your BMI and based on it develop an exercise and dietary program to help you achieve or maintain this important measurement at a healthful level.  High blood pressure causes heart and blood vessel problems.  Persistent high blood pressure should be treated with medicine if weight loss and exercise do not work.   Fat and cholesterol leaves  deposits in your arteries that can block them. This causes heart disease and vessel disease elsewhere in your body.  If your cholesterol is found to be high, or if you have heart disease or certain other medical conditions, then you may need to have your cholesterol monitored frequently and be treated with medication.   Ask if you should have a cardiac stress test if your history suggests this. A stress test is a test done on a treadmill that looks for heart disease. This test can find disease prior to there being a problem.  Menopause can be associated with physical symptoms and risks. Hormone replacement therapy is available to decrease these. You should talk to your caregiver about whether starting or continuing to take hormones is right for you.   Osteoporosis is a disease in which the bones lose minerals and strength as we age. This can result in serious bone fractures. Risk of osteoporosis can be identified using a bone density scan. Women ages 22 and over should discuss this with their caregivers, as should women after menopause who have other risk factors. Ask your caregiver whether you should be taking a calcium supplement and Vitamin D, to reduce the rate of osteoporosis.   Avoid drinking alcohol in excess (more than two drinks per day).  Avoid use of street drugs. Do not share needles with anyone. Ask for professional help if you need assistance or instructions on stopping the use of alcohol, cigarettes, and/or drugs.  Brush your teeth twice a day with fluoride toothpaste, and floss once a day. Good oral hygiene prevents tooth decay and gum disease. The problems can be painful, unattractive, and can cause other health problems. Visit your dentist for a routine oral and dental check up and preventive care every 6-12 months.   Look at your skin regularly.  Use a mirror to look at your back. Notify your caregivers of changes in moles, especially if there are changes in shapes, colors, a size  larger than a pencil eraser, an irregular border, or development of new moles.  Safety:  Use seatbelts 100% of the time, whether driving or as a passenger.  Use safety devices such as hearing protection if you work in environments with loud noise or significant background noise.  Use safety glasses when doing any work that could send debris in to the eyes.  Use a helmet if you ride a bike or motorcycle.  Use appropriate safety gear for contact sports.  Talk to your caregiver about gun safety.  Use sunscreen with a SPF (or skin protection factor) of 15 or greater.  Lighter skinned people are at a greater risk of skin cancer. Don't forget to also wear sunglasses in order to protect your eyes from too much damaging sunlight. Damaging sunlight can accelerate cataract formation.   Practice safe sex. Use condoms. Condoms are used for birth control and to help reduce the spread of sexually transmitted infections (or STIs).  Some of the STIs are gonorrhea (the clap), chlamydia, syphilis, trichomonas, herpes, HPV (human papilloma virus) and HIV (human immunodeficiency virus)  which causes AIDS. The herpes, HIV and HPV are viral illnesses that have no cure. These can result in disability, cancer and death.   Keep carbon monoxide and smoke detectors in your home functioning at all times. Change the batteries every 6 months or use a model that plugs into the wall.   Vaccinations:  Stay up to date with your tetanus shots and other required immunizations. You should have a booster for tetanus every 10 years. Be sure to get your flu shot every year, since 5%-20% of the [place] population comes down with the flu. The flu vaccine changes each year, so being vaccinated once is not enough. Get your shot in the fall, before the flu season peaks.   Other vaccines to consider:  Human Papilloma Virus or HPV causes cancer of the cervix, and other infections that can be transmitted from person to person. There is a vaccine for  HPV, and females should get immunized between the ages of 63 and 6. It requires a series of 3 shots.   Pneumococcal vaccine to protect against certain types of pneumonia.  This is normally recommended for adults age 75 or older.  However, adults younger than 76 years old with certain underlying conditions such as diabetes, heart or lung disease should also receive the vaccine.  Shingles vaccine to protect against Varicella Zoster if you are older than age 82, or younger than 76 years old with certain underlying illness.  Hepatitis A vaccine to protect against a form of infection of the liver by a virus acquired from food.  Hepatitis B vaccine to protect against a form of infection of the liver by a virus acquired from blood or body fluids, particularly if you work in health care.  If you plan to travel internationally, check with your local health department for specific vaccination recommendations.  Cancer Screening:  Breast cancer screening is essential to preventive care for women. All women age 50 and older should perform a breast self-exam every month. At age 61 and older, women should have their caregiver complete a breast exam each year. Women at ages 45 and older should have a mammogram (x-ray film) of the breasts. Your caregiver can discuss how often you need mammograms.    Cervical cancer screening includes taking a Pap smear (sample of cells examined under a microscope) from the cervix (end of the uterus). It also includes testing for HPV (Human Papilloma Virus, which can cause cervical cancer). Screening and a pelvic exam should begin at age 67, or 3 years after a woman becomes sexually active. Screening should occur every year, with a Pap smear but no HPV testing, up to age 5. After age 63, you should have a Pap smear every 3 years with HPV testing, if no HPV was found previously.   Most routine colon cancer screening begins at the age of 42. On a yearly basis, doctors may provide  special easy to use take-home tests to check for hidden blood in the stool. Sigmoidoscopy or colonoscopy can detect the earliest forms of colon cancer and is life saving. These tests use a small camera at the end of a tube to directly examine the colon. Speak to your caregiver about this at age 61, when routine screening begins (and is repeated every 5 years unless early forms of pre-cancerous polyps or small growths are found).      Bad carbs also include fruit juice, alcohol, and sweet tea. These are empty calories that do not signal to your  brain that you are full.   Please remember the good carbs are still carbs which convert into sugar. So please measure them out no more than 1/2-1 cup of rice, oatmeal, pasta, and beans.  Veggies are however free foods! Pile them on.   I like lean protein at every meal such as chicken, [place], pork chops, cottage cheese, etc. Just do not fry these meats and please center your meal around vegetable, the meats should be a side dish.   No all fruit is created equal. Please see the list below, the fruit at the bottom is higher in sugars than the fruit at the top

## 2014-02-14 LAB — VITAMIN D 25 HYDROXY (VIT D DEFICIENCY, FRACTURES): Vit D, 25-Hydroxy: 82 ng/mL (ref 30–89)

## 2014-02-20 ENCOUNTER — Ambulatory Visit
Admission: RE | Admit: 2014-02-20 | Discharge: 2014-02-20 | Disposition: A | Discharge status: Home / Self Care | Payer: Medicare Other | Source: Ambulatory Visit

## 2014-02-20 DIAGNOSIS — Z1231 Encounter for screening mammogram for malignant neoplasm of breast: Secondary | ICD-10-CM

## 2014-04-06 ENCOUNTER — Other Ambulatory Visit: Payer: Self-pay | Admitting: *Deleted

## 2014-04-06 MED ORDER — AMITRIPTYLINE HCL 10 MG PO TABS: ORAL_TABLET | ORAL | Status: DC

## 2014-05-11 ENCOUNTER — Encounter: Payer: Self-pay | Admitting: Emergency Medicine

## 2014-05-11 ENCOUNTER — Encounter: Payer: Self-pay | Admitting: Internal Medicine

## 2014-05-11 ENCOUNTER — Ambulatory Visit: Payer: Self-pay | Admitting: Internal Medicine

## 2014-05-11 ENCOUNTER — Other Ambulatory Visit: Payer: Self-pay | Admitting: *Deleted

## 2014-05-11 ENCOUNTER — Ambulatory Visit (INDEPENDENT_AMBULATORY_CARE_PROVIDER_SITE_OTHER): Payer: Medicare Other | Admitting: Emergency Medicine

## 2014-05-11 ENCOUNTER — Telehealth: Payer: Self-pay

## 2014-05-11 VITALS — BP 120/62 | HR 90 | Temp 98.4°F | Resp 16 | Ht 63.5 in | Wt 147.0 lb

## 2014-05-11 DIAGNOSIS — L602 Onychogryphosis: Secondary | ICD-10-CM

## 2014-05-11 DIAGNOSIS — I10 Essential (primary) hypertension: Secondary | ICD-10-CM

## 2014-05-11 DIAGNOSIS — E782 Mixed hyperlipidemia: Secondary | ICD-10-CM

## 2014-05-11 DIAGNOSIS — R7309 Other abnormal glucose: Secondary | ICD-10-CM

## 2014-05-11 DIAGNOSIS — Z780 Asymptomatic menopausal state: Secondary | ICD-10-CM

## 2014-05-11 LAB — CBC WITH DIFFERENTIAL/PLATELET
Basophils Absolute: 0.1 10*3/uL (ref 0.0–0.1)
Basophils Relative: 1 % (ref 0–1)
EOS ABS: 0.3 10*3/uL (ref 0.0–0.7)
Eosinophils Relative: 4 % (ref 0–5)
HCT: 37 % (ref 36.0–46.0)
Hemoglobin: 12.6 g/dL (ref 12.0–15.0)
LYMPHS PCT: 38 % (ref 12–46)
Lymphs Abs: 2.5 10*3/uL (ref 0.7–4.0)
MCH: 29.5 pg (ref 26.0–34.0)
MCHC: 34.1 g/dL (ref 30.0–36.0)
MCV: 86.7 fL (ref 78.0–100.0)
Monocytes Absolute: 0.4 10*3/uL (ref 0.1–1.0)
Monocytes Relative: 6 % (ref 3–12)
Neutro Abs: 3.4 10*3/uL (ref 1.7–7.7)
Neutrophils Relative %: 51 % (ref 43–77)
PLATELETS: 196 10*3/uL (ref 150–400)
RBC: 4.27 MIL/uL (ref 3.87–5.11)
RDW: 14.8 % (ref 11.5–15.5)
WBC: 6.6 10*3/uL (ref 4.0–10.5)

## 2014-05-11 NOTE — Progress Notes (Signed)
Subjective:    Patient ID: Kayla Baker, female    DOB: 09/18/1936, 78 y.o.   MRN: [id number]  HPI Comments: 78 yo WF presents for 3 month F/U for HTN, Cholesterol, Pre-Dm, D. Deficient. She notes when BP is checked it is good. BS has been around 100. She is eating healthy. She is exercising routinely. She is on Amitriptyline for leg pain only not for sleep.  WBC             6.7   02/13/2014 HGB            13.5   02/13/2014 HCT            39.3   02/13/2014 PLT             211   02/13/2014 GLUCOSE          99   02/13/2014 CHOL            170   02/13/2014 TRIG            182   02/13/2014 HDL              54   02/13/2014 LDLCALC          80   02/13/2014 ALT              22   02/13/2014 AST              21   02/13/2014 NA              140   02/13/2014 K               4.2   02/13/2014 CL              102   02/13/2014 CREATININE     1.33   02/13/2014 BUN              23   02/13/2014 CO2              30   02/13/2014 TSH           2.706   02/13/2014 HGBA1C          6.3   02/13/2014 MICROALBUR     0.50   11/10/2013   Hyperlipidemia  Gastrophageal Reflux  Hypertension      Medication List       This list is accurate as of: 05/11/14  9:08 AM.  Always use your most recent med list.               allopurinol 300 MG tablet  Commonly known as:  ZYLOPRIM  Take 300 mg by mouth daily. Takes 1/2 tablet daily     amitriptyline 10 MG tablet  Commonly known as:  ELAVIL  Takes 2 tablets at bedtime     aspirin 81 MG tablet  Take 81 mg by mouth daily.     atorvastatin 40 MG tablet  Commonly known as:  LIPITOR  Take 40 mg by mouth daily.     cholecalciferol 1000 UNITS tablet  Commonly known as:  VITAMIN D  Take 1,000 Units by mouth daily.     enalapril 20 MG tablet  Commonly known as:  VASOTEC  Take 20 mg by mouth daily.     estradiol 1 MG tablet  Commonly known as:  ESTRACE  Take 1 mg by mouth daily. Takes 1/2 tablet daily     Fish Oil 1200 MG Caps  Take by  mouth daily. Takes 2 capsules  daily     hydrochlorothiazide 25 MG tablet  Commonly known as:  HYDRODIURIL  TAKE 1 TABLET BY MOUTH DAILY pt takes 1/2-1tab qd     Magnesium 250 MG Tabs  Take 250 mg by mouth 2 (two) times daily. Takes 2 tabs in am and 2 tabs in pm     multivitamin tablet  Take 1 tablet by mouth daily.     omeprazole 40 MG capsule  Commonly known as:  PRILOSEC  Take 40 mg by mouth daily.     vitamin B-12 1000 MCG tablet  Commonly known as:  CYANOCOBALAMIN  Take 1,000 mcg by mouth daily. Takes 1 tablet every other day     vitamin C 500 MG tablet  Commonly known as:  ASCORBIC ACID  Take 500 mg by mouth daily. Takes 2 tablets daily        Allergies  Allergen Reactions  . Keflex [Cephalexin] Swelling    Tongue and throat swells  . Meloxicam   . Prednisone     High doses  . Premarin [Estrogens Conjugated]   . Trovan [Alatrofloxacin]    Past Medical History  Diagnosis Date  . Hypertension   . Hyperlipidemia   . GERD (gastroesophageal reflux disease)   . Gout   . Allergy   . Diet-controlled type 2 diabetes mellitus   . Vitamin D deficiency   . Anemia      Review of Systems  All other systems reviewed and are negative.  BP 120/62  Pulse 90  Temp(Src) 98.4 F (36.9 C) (Temporal)  Resp 16  Ht 5' 3.5" (1.613 m)  Wt 147 lb (66.679 kg)  BMI 25.63 kg/m2     Objective:   Physical Exam  Nursing note and vitals reviewed. Constitutional: She is oriented to person, place, and time. She appears well-developed and well-nourished. No distress.  HENT:  Head: Normocephalic and atraumatic.  Right Ear: External ear normal.  Left Ear: External ear normal.  Nose: Nose normal.  Mouth/Throat: Oropharynx is clear and moist.  Eyes: Conjunctivae and EOM are normal.  Neck: Normal range of motion. Neck supple. No JVD present. No thyromegaly present.  Cardiovascular: Normal rate, regular rhythm, normal heart sounds and intact distal pulses.   Pulmonary/Chest: Effort normal and breath sounds  normal.  Abdominal: Soft. Bowel sounds are normal. She exhibits no distension. There is no tenderness. There is no rebound.  Musculoskeletal: Normal range of motion. She exhibits no edema and no tenderness.  Lymphadenopathy:    She has no cervical adenopathy.  Neurological: She is alert and oriented to person, place, and time. No cranial nerve deficit.  Skin: Skin is warm and dry. No rash noted. No erythema. No pallor.  Psychiatric: She has a normal mood and affect. Her behavior is normal. Judgment and thought content normal.          Assessment & Plan:  1.  3 month F/U for HTN, Cholesterol, Pre-Dm, D. Deficient. Needs healthy diet, cardio QD and obtain healthy weight. Check Labs, Check BP if >130/80 call office   2. Leg pain controlled- Continue amitriptyline AD, try to add protein snack to see if any improvement in symtpoms

## 2014-05-11 NOTE — Telephone Encounter (Signed)
Message copied by Nadyne Coombes on Thu May 11, 2014 10:15 AM ------      Message from: Kelby Aline R      Created: Thu May 11, 2014  9:16 AM       She needs podiatry referral but she also needs separate [facility] scheduled at [facility] ------

## 2014-05-11 NOTE — Telephone Encounter (Signed)
[facility] 05-15-14 @ 1:30. Pt aware of appt

## 2014-05-12 LAB — HEMOGLOBIN A1C
Hgb A1c MFr Bld: 6.4 % — ABNORMAL HIGH (ref <5.7)
MEAN PLASMA GLUCOSE: 137 mg/dL — AB (ref <117)

## 2014-05-12 LAB — LIPID PANEL
Cholesterol: 135 mg/dL (ref 0–200)
HDL: 55 mg/dL (ref >39)
LDL Cholesterol: 59 mg/dL (ref 0–99)
Total CHOL/HDL Ratio: 2.5 Ratio
Triglycerides: 104 mg/dL (ref <150)
VLDL: 21 mg/dL (ref 0–40)

## 2014-05-12 LAB — HEPATIC FUNCTION PANEL
ALBUMIN: 4.2 g/dL (ref 3.5–5.2)
ALK PHOS: 97 U/L (ref 39–117)
ALT: 26 U/L (ref 0–35)
AST: 27 U/L (ref 0–37)
Bilirubin, Direct: 0.1 mg/dL (ref 0.0–0.3)
Indirect Bilirubin: 0.5 mg/dL (ref 0.2–1.2)
TOTAL PROTEIN: 6.7 g/dL (ref 6.0–8.3)
Total Bilirubin: 0.6 mg/dL (ref 0.2–1.2)

## 2014-05-12 LAB — BASIC METABOLIC PANEL WITH GFR
BUN: 24 mg/dL — AB (ref 6–23)
CALCIUM: 9.6 mg/dL (ref 8.4–10.5)
CO2: 28 mEq/L (ref 19–32)
CREATININE: 1.29 mg/dL — AB (ref 0.50–1.10)
Chloride: 104 mEq/L (ref 96–112)
GFR, EST AFRICAN AMERICAN: 46 mL/min — AB
GFR, EST NON AFRICAN AMERICAN: 40 mL/min — AB
Glucose, Bld: 96 mg/dL (ref 70–99)
Potassium: 4.2 mEq/L (ref 3.5–5.3)
Sodium: 141 mEq/L (ref 135–145)

## 2014-05-12 LAB — INSULIN, FASTING: INSULIN FASTING, SERUM: 20 u[IU]/mL (ref 3–28)

## 2014-05-15 ENCOUNTER — Other Ambulatory Visit: Payer: Medicare Other

## 2014-05-16 ENCOUNTER — Other Ambulatory Visit: Payer: Self-pay | Admitting: Internal Medicine

## 2014-05-16 ENCOUNTER — Telehealth: Payer: Self-pay | Admitting: *Deleted

## 2014-05-16 NOTE — Telephone Encounter (Signed)
Pharmacy requested prior authorization of Estradiol 1 mg.  Per Dr Melford Aase, due to patient's age insurance will not approve, but patient can pay cash on the [employer] low price med list.

## 2014-05-22 ENCOUNTER — Ambulatory Visit
Admission: RE | Admit: 2014-05-22 | Discharge: 2014-05-22 | Disposition: A | Discharge status: Home / Self Care | Payer: Medicare Other | Source: Ambulatory Visit | Attending: Physician Assistant | Admitting: Physician Assistant

## 2014-05-22 DIAGNOSIS — E2839 Other primary ovarian failure: Secondary | ICD-10-CM

## 2014-05-26 ENCOUNTER — Other Ambulatory Visit: Payer: Self-pay | Admitting: Internal Medicine

## 2014-05-29 ENCOUNTER — Ambulatory Visit: Payer: Self-pay | Admitting: Podiatry

## 2014-06-12 ENCOUNTER — Encounter: Payer: Self-pay | Admitting: Podiatry

## 2014-06-12 ENCOUNTER — Telehealth: Payer: Self-pay | Admitting: *Deleted

## 2014-06-12 ENCOUNTER — Ambulatory Visit (INDEPENDENT_AMBULATORY_CARE_PROVIDER_SITE_OTHER): Payer: Medicare Other | Admitting: Podiatry

## 2014-06-12 VITALS — BP 125/66 | HR 86 | Resp 15 | Ht 64.0 in | Wt 148.0 lb

## 2014-06-12 DIAGNOSIS — B351 Tinea unguium: Secondary | ICD-10-CM

## 2014-06-12 NOTE — Telephone Encounter (Signed)
Right 1st toenail fragment sent to [facility] [phone], for definitive diagnosis of fungal element.

## 2014-06-12 NOTE — Patient Instructions (Signed)
Our office will contact you with your lab results at least 30 days

## 2014-06-12 NOTE — Progress Notes (Signed)
   Subjective:    Patient ID: Kayla Baker, female    DOB: 1936-03-19, 78 y.o.   MRN: [id number]  HPI Comments: N toenail problem L right 1st toenail D 6 month discoloration and loose for over 4 years O full wheelbarrow fell on the right 1st toe 4 years ago C thick, discoloration across the toenail base A possible fungal infection vs toenail bed injury T Dr. John Giovanni PA was concerned the darkened area was a new problem, pt used OTC Lamisil cream on the toe and the toenail.     Review of Systems  All other systems reviewed and are negative.      Objective:   Physical Exam  Orientated x3 white female  Vascular: DP is 1/4 bilaterally PTs 2/4 bilaterally  Neurological: Sensation to 10 g monofilament wire intact 5/5 bilaterally Vibratory sensation reactive bilaterally Ankle reflexes equal and reactive bilaterally  Dermatological: The right hallux nail has some dystrophic changes with texture and color is within the nail plate  Musculoskeletal No deformities noted      Assessment & Plan:   Assessment: Satisfactory neurovascular status Onychomycoses right hallux nail  Plan:  we discussed no treatment of this problem including debridement, nail removal, oral medication if lab is positive. Patient is interested in considering active treatment it would consider oral medication  The right hallux nail was debrided completely and the nail fragments submitted for PAS and fungal culture The nail bed after debridement demonstrated no color changes or lesions.   Notify patient on receipt of lab

## 2014-06-13 ENCOUNTER — Encounter: Payer: Self-pay | Admitting: Podiatry

## 2014-06-27 ENCOUNTER — Encounter: Payer: Self-pay | Admitting: Podiatry

## 2014-07-17 ENCOUNTER — Telehealth: Payer: Self-pay | Admitting: *Deleted

## 2014-07-17 NOTE — Telephone Encounter (Signed)
Saw him 06/12/2014.  He took some nail clippings to be tested for fungus.  I understood I would receive some information about the test within 30 days.  I haven't had any information.  Call me back and let me know what the results are.  Thank you.

## 2014-07-18 ENCOUNTER — Encounter: Payer: Self-pay | Admitting: Podiatry

## 2014-07-18 NOTE — Telephone Encounter (Signed)
I called and left her a message of Dr. Phoebe Perch response.  He said not able to confirm mycotic toenail infection.  If you want, we can re-culture one more time.  Please call and schedule an appointment if you want to proceed.

## 2014-07-28 ENCOUNTER — Other Ambulatory Visit: Payer: Self-pay | Admitting: Internal Medicine

## 2014-08-27 ENCOUNTER — Encounter: Payer: Self-pay | Admitting: Internal Medicine

## 2014-08-27 DIAGNOSIS — Z79899 Other long term (current) drug therapy: Secondary | ICD-10-CM | POA: Insufficient documentation

## 2014-08-27 NOTE — Progress Notes (Signed)
Patient ID: Kayla Baker, female   DOB: 10-Jul-1936, 78 y.o.   MRN: [id number]   This very nice 78 y.o.MWF presents for 3 month follow up with Hypertension, Hyperlipidemia, Gout, T2_NIDDM with CKD3 and Vitamin D Deficiency.    Patient is treated for HTN & BP has been controlled at home. Today's BP: 102/64 mmHg. Patient denies any cardiac type chest pain, palpitations, dyspnea/orthopnea/PND, dizziness, claudication, or dependent edema.   Hyperlipidemia is controlled with diet & meds. Patient denies myalgias or other med SE's. Last Lipids were Total Cho 135; HDL  55; LDL59; Trig 104  on  05/11/2014.   Also, the patient has history of T2_NIDDM (diet controlled) and Stage 3 CKD (GFR 40 ml/min) since June 2010  and patient denies any symptoms of reactive hypoglycemia, diabetic polys or visual blurring.  She does report painful burning dysthesias of the soles of her feet partially controlled with  Amitriptyline consistent with neurologic manifestations of her T2DM.  Last A1c was  6.4% on   05/11/2014.    Further, Patient has history of Vitamin D Deficiency and patient supplements vitamin D without any suspected side-effects. Last vitamin D was  82 on  02/13/2014.    Medication List   allopurinol 300 MG tablet  Commonly known as:  ZYLOPRIM  Take 300 mg by mouth daily. Takes 1/2 tablet daily     amitriptyline 10 MG tablet  Commonly known as:  ELAVIL  Takes 2 tablets at bedtime     aspirin 81 MG tablet  Take 81 mg by mouth daily.     atorvastatin 40 MG tablet  Commonly known as:  LIPITOR  Take 40 mg by mouth daily.     cholecalciferol 1000 UNITS tablet  Commonly known as:  VITAMIN D  Take 1,000 Units by mouth daily.     enalapril 20 MG tablet  Commonly known as:  VASOTEC  TAKE 1 TABLET BY MOUTH DAILY     estradiol 1 MG tablet  Commonly known as:  ESTRACE  TAKE 1 TABLET BY MOUTH DAILY     Fish Oil 1200 MG Caps  Take by mouth daily. Takes 2 capsules daily     hydrochlorothiazide 25 MG tablet   Commonly known as:  HYDRODIURIL  TAKE 1 TABLET BY MOUTH DAILY pt takes 1/2-1tab qd     Magnesium 250 MG Tabs  Take 250 mg by mouth 2 (two) times daily. Takes 2 tabs in am and 2 tabs in pm     multivitamin tablet  Take 1 tablet by mouth daily.     omeprazole 40 MG capsule  Commonly known as:  PRILOSEC  TAKE ONE CAPSULE BY MOUTH ONCE A DAY     vitamin B-12 1000 MCG tablet  Commonly known as:  CYANOCOBALAMIN  Take 1,000 mcg by mouth daily. Takes 1 tablet every other day     vitamin C 500 MG tablet  Commonly known as:  ASCORBIC ACID  Take 500 mg by mouth daily. Takes 2 tablets daily     Allergies  Allergen Reactions  . Keflex [Cephalexin] Swelling    Tongue and throat swells  . Meloxicam   . Prednisone     High doses  . Premarin [Estrogens Conjugated]   . Trovan [Alatrofloxacin]    PMHx:   Past Medical History  Diagnosis Date  . Hypertension   . Hyperlipidemia   . GERD (gastroesophageal reflux disease)   . Gout   . Allergy   . Diet-controlled type 2 diabetes mellitus   .  Vitamin D deficiency   . Anemia    FHx:    Reviewed / unchanged SHx:    Reviewed / unchanged  Systems Review:  Constitutional: Denies fever, chills, wt changes, headaches, insomnia, fatigue, night sweats, change in appetite. Eyes: Denies redness, blurred vision, diplopia, discharge, itchy, watery eyes.  ENT: Denies discharge, congestion, post nasal drip, epistaxis, sore throat, earache, hearing loss, dental pain, tinnitus, vertigo, sinus pain, snoring.  CV: Denies chest pain, palpitations, irregular heartbeat, syncope, dyspnea, diaphoresis, orthopnea, PND, claudication or edema. Respiratory: denies cough, dyspnea, DOE, pleurisy, hoarseness, laryngitis, wheezing.  Gastrointestinal: Denies dysphagia, odynophagia, heartburn, reflux, water brash, abdominal pain or cramps, nausea, vomiting, bloating, diarrhea, constipation, hematemesis, melena, hematochezia  or hemorrhoids. Genitourinary: Denies  dysuria, frequency, urgency, nocturia, hesitancy, discharge, hematuria or flank pain. Musculoskeletal: Denies arthralgias, myalgias, stiffness, jt. swelling, pain, limping or strain/sprain.  Skin: Denies pruritus, rash, hives, warts, acne, eczema or change in skin lesion(s). Neuro: No weakness, tremor, incoordination, spasms, paresthesia or pain. Psychiatric: Denies confusion, memory loss or sensory loss. Endo: Denies change in weight, skin or hair change.  Heme/Lymph: No excessive bleeding, bruising or enlarged lymph nodes.  Exam:  BP 102/64  Pulse 88  Temp(Src) 99.1 F (37.3 C) (Temporal)  Resp 16  Ht 5' 3.5" (1.613 m)  Wt 146 lb 12.8 oz (66.588 kg)  BMI 25.59 kg/m2  Appears well nourished and in no distress. Eyes: PERRLA, EOMs, conjunctiva no swelling or erythema. Sinuses: No frontal/maxillary tenderness ENT/Mouth: EAC's clear, TM's nl w/o erythema, bulging. Nares clear w/o erythema, swelling, exudates. Oropharynx clear without erythema or exudates. Oral hygiene is good. Tongue normal, non obstructing. Hearing intact.  Neck: Supple. Thyroid nl. Car 2+/2+ without bruits, nodes or JVD. Chest: Respirations nl with BS clear & equal w/o rales, rhonchi, wheezing or stridor.  Cor: Heart sounds normal w/ regular rate and rhythm without sig. murmurs, gallops, clicks, or rubs. Peripheral pulses normal and equal  without edema.  Abdomen: Soft & bowel sounds normal. Non-tender w/o guarding, rebound, hernias, masses, or organomegaly.  Lymphatics: Unremarkable.  Musculoskeletal: Full ROM all peripheral extremities, joint stability, 5/5 strength, and normal gait.  Skin: Warm, dry without exposed rashes, lesions or ecchymosis apparent.  Neuro: Cranial nerves intact, reflexes equal bilaterally. Sensory-motor testing grossly intact. Tendon reflexes grossly intact.  Pysch: Alert & oriented x 3.  Insight and judgement nl & appropriate. No ideations.  Assessment and Plan:  1. Hypertension - Continue  monitor blood pressure at home. Continue diet/meds same.  2. Hyperlipidemia - Continue diet/meds, exercise,& lifestyle modifications. Continue monitor periodic cholesterol/liver & renal functions   3. T2_NIDDM (diet) - Continue diet, exercise, lifestyle modifications. Monitor appropriate labs.   4. Diabetic Neuropathy - Try d/c amitriptyline & given voucher t & Sx's to try Lyrica 50 mg - 1-3 caps qd - discussing med effect & SE's.  5. Vitamin D Deficiency - Continue supplementation.  Recommended regular exercise, BP monitoring, weight control, and discussed med and SE's. Recommended labs to assess and monitor clinical status. Further disposition pending results of labs.

## 2014-08-27 NOTE — Patient Instructions (Signed)

## 2014-08-28 ENCOUNTER — Ambulatory Visit (INDEPENDENT_AMBULATORY_CARE_PROVIDER_SITE_OTHER): Payer: Medicare Other | Admitting: Internal Medicine

## 2014-08-28 ENCOUNTER — Encounter: Payer: Self-pay | Admitting: Internal Medicine

## 2014-08-28 VITALS — BP 102/64 | HR 88 | Temp 99.1°F | Resp 16 | Ht 63.5 in | Wt 146.8 lb

## 2014-08-28 DIAGNOSIS — Z79899 Other long term (current) drug therapy: Secondary | ICD-10-CM

## 2014-08-28 DIAGNOSIS — I10 Essential (primary) hypertension: Secondary | ICD-10-CM

## 2014-08-28 DIAGNOSIS — E782 Mixed hyperlipidemia: Secondary | ICD-10-CM

## 2014-08-28 DIAGNOSIS — E559 Vitamin D deficiency, unspecified: Secondary | ICD-10-CM

## 2014-08-28 DIAGNOSIS — E1149 Type 2 diabetes mellitus with other diabetic neurological complication: Secondary | ICD-10-CM

## 2014-08-28 DIAGNOSIS — Z23 Encounter for immunization: Secondary | ICD-10-CM

## 2014-08-28 DIAGNOSIS — E1142 Type 2 diabetes mellitus with diabetic polyneuropathy: Secondary | ICD-10-CM

## 2014-08-28 DIAGNOSIS — E1140 Type 2 diabetes mellitus with diabetic neuropathy, unspecified: Secondary | ICD-10-CM | POA: Insufficient documentation

## 2014-08-28 DIAGNOSIS — R7309 Other abnormal glucose: Secondary | ICD-10-CM

## 2014-08-29 LAB — BASIC METABOLIC PANEL WITH GFR
BUN: 23 mg/dL (ref 6–23)
CALCIUM: 9.8 mg/dL (ref 8.4–10.5)
CHLORIDE: 103 meq/L (ref 96–112)
CO2: 29 mEq/L (ref 19–32)
CREATININE: 1.17 mg/dL — AB (ref 0.50–1.10)
GFR, EST NON AFRICAN AMERICAN: 45 mL/min — AB
GFR, Est African American: 52 mL/min — ABNORMAL LOW
Glucose, Bld: 105 mg/dL — ABNORMAL HIGH (ref 70–99)
Potassium: 4.2 mEq/L (ref 3.5–5.3)
SODIUM: 142 meq/L (ref 135–145)

## 2014-08-29 LAB — CBC WITH DIFFERENTIAL/PLATELET
BASOS ABS: 0.1 10*3/uL (ref 0.0–0.1)
Basophils Relative: 1 % (ref 0–1)
EOS PCT: 3 % (ref 0–5)
Eosinophils Absolute: 0.2 10*3/uL (ref 0.0–0.7)
HCT: 38.8 % (ref 36.0–46.0)
Hemoglobin: 12.7 g/dL (ref 12.0–15.0)
Lymphocytes Relative: 37 % (ref 12–46)
Lymphs Abs: 2.5 10*3/uL (ref 0.7–4.0)
MCH: 29.7 pg (ref 26.0–34.0)
MCHC: 32.7 g/dL (ref 30.0–36.0)
MCV: 90.9 fL (ref 78.0–100.0)
Monocytes Absolute: 0.4 10*3/uL (ref 0.1–1.0)
Monocytes Relative: 6 % (ref 3–12)
Neutro Abs: 3.6 10*3/uL (ref 1.7–7.7)
Neutrophils Relative %: 53 % (ref 43–77)
PLATELETS: 184 10*3/uL (ref 150–400)
RBC: 4.27 MIL/uL (ref 3.87–5.11)
RDW: 14.3 % (ref 11.5–15.5)
WBC: 6.8 10*3/uL (ref 4.0–10.5)

## 2014-08-29 LAB — HEMOGLOBIN A1C
Hgb A1c MFr Bld: 6.2 % — ABNORMAL HIGH (ref <5.7)
Mean Plasma Glucose: 131 mg/dL — ABNORMAL HIGH (ref <117)

## 2014-08-29 LAB — HEPATIC FUNCTION PANEL
ALK PHOS: 87 U/L (ref 39–117)
ALT: 21 U/L (ref 0–35)
AST: 22 U/L (ref 0–37)
Albumin: 4.2 g/dL (ref 3.5–5.2)
BILIRUBIN DIRECT: 0.1 mg/dL (ref 0.0–0.3)
BILIRUBIN TOTAL: 0.5 mg/dL (ref 0.2–1.2)
Indirect Bilirubin: 0.4 mg/dL (ref 0.2–1.2)
Total Protein: 6.7 g/dL (ref 6.0–8.3)

## 2014-08-29 LAB — LIPID PANEL
Cholesterol: 168 mg/dL (ref 0–200)
HDL: 52 mg/dL (ref >39)
LDL Cholesterol: 75 mg/dL (ref 0–99)
TRIGLYCERIDES: 203 mg/dL — AB (ref <150)
Total CHOL/HDL Ratio: 3.2 Ratio
VLDL: 41 mg/dL — ABNORMAL HIGH (ref 0–40)

## 2014-08-29 LAB — MAGNESIUM: MAGNESIUM: 1.7 mg/dL (ref 1.5–2.5)

## 2014-08-29 LAB — TSH: TSH: 2.806 u[IU]/mL (ref 0.350–4.500)

## 2014-08-29 LAB — INSULIN, FASTING: Insulin fasting, serum: 19.4 u[IU]/mL (ref 2.0–19.6)

## 2014-08-29 LAB — VITAMIN D 25 HYDROXY (VIT D DEFICIENCY, FRACTURES): VIT D 25 HYDROXY: 86 ng/mL (ref 30–89)

## 2014-08-30 ENCOUNTER — Other Ambulatory Visit: Payer: Self-pay | Admitting: Emergency Medicine

## 2014-08-30 ENCOUNTER — Other Ambulatory Visit: Payer: Self-pay | Admitting: Internal Medicine

## 2014-09-25 ENCOUNTER — Other Ambulatory Visit: Payer: Self-pay | Admitting: Emergency Medicine

## 2014-09-26 ENCOUNTER — Other Ambulatory Visit: Payer: Self-pay | Admitting: *Deleted

## 2014-09-26 MED ORDER — PREGABALIN 50 MG PO CAPS: 50.0000 mg | ORAL_CAPSULE | Freq: Three times a day (TID) | ORAL | Status: DC

## 2014-09-27 ENCOUNTER — Other Ambulatory Visit: Payer: Self-pay | Admitting: Internal Medicine

## 2014-09-28 ENCOUNTER — Other Ambulatory Visit: Payer: Self-pay | Admitting: *Deleted

## 2014-09-28 MED ORDER — PREGABALIN 50 MG PO CAPS: 50.0000 mg | ORAL_CAPSULE | Freq: Three times a day (TID) | ORAL | Status: DC

## 2014-10-02 ENCOUNTER — Encounter: Payer: Self-pay | Admitting: Internal Medicine

## 2014-10-10 ENCOUNTER — Other Ambulatory Visit: Payer: Self-pay | Admitting: Internal Medicine

## 2014-11-10 ENCOUNTER — Other Ambulatory Visit: Payer: Self-pay | Admitting: *Deleted

## 2014-11-10 MED ORDER — ATORVASTATIN CALCIUM 40 MG PO TABS: 40.0000 mg | ORAL_TABLET | Freq: Every day | ORAL | Status: DC

## 2014-11-14 ENCOUNTER — Encounter: Payer: Self-pay | Admitting: Internal Medicine

## 2014-12-08 ENCOUNTER — Ambulatory Visit (INDEPENDENT_AMBULATORY_CARE_PROVIDER_SITE_OTHER): Payer: PPO | Admitting: Internal Medicine

## 2014-12-08 ENCOUNTER — Encounter: Payer: Self-pay | Admitting: Internal Medicine

## 2014-12-08 VITALS — BP 122/78 | HR 84 | Temp 97.9°F | Resp 16 | Ht 63.5 in | Wt 153.2 lb

## 2014-12-08 DIAGNOSIS — Z9181 History of falling: Secondary | ICD-10-CM

## 2014-12-08 DIAGNOSIS — E1140 Type 2 diabetes mellitus with diabetic neuropathy, unspecified: Secondary | ICD-10-CM

## 2014-12-08 DIAGNOSIS — E1129 Type 2 diabetes mellitus with other diabetic kidney complication: Secondary | ICD-10-CM

## 2014-12-08 DIAGNOSIS — E559 Vitamin D deficiency, unspecified: Secondary | ICD-10-CM

## 2014-12-08 DIAGNOSIS — M109 Gout, unspecified: Secondary | ICD-10-CM | POA: Insufficient documentation

## 2014-12-08 DIAGNOSIS — Z79899 Other long term (current) drug therapy: Secondary | ICD-10-CM

## 2014-12-08 DIAGNOSIS — Z1331 Encounter for screening for depression: Secondary | ICD-10-CM

## 2014-12-08 DIAGNOSIS — I10 Essential (primary) hypertension: Secondary | ICD-10-CM

## 2014-12-08 DIAGNOSIS — E782 Mixed hyperlipidemia: Secondary | ICD-10-CM

## 2014-12-08 DIAGNOSIS — Z1212 Encounter for screening for malignant neoplasm of rectum: Secondary | ICD-10-CM

## 2014-12-08 DIAGNOSIS — M1000 Idiopathic gout, unspecified site: Secondary | ICD-10-CM

## 2014-12-08 LAB — CBC WITH DIFFERENTIAL/PLATELET
BASOS PCT: 1 % (ref 0–1)
Basophils Absolute: 0.1 10*3/uL (ref 0.0–0.1)
EOS PCT: 3 % (ref 0–5)
Eosinophils Absolute: 0.2 10*3/uL (ref 0.0–0.7)
HCT: 38.2 % (ref 36.0–46.0)
Hemoglobin: 12.6 g/dL (ref 12.0–15.0)
LYMPHS ABS: 2.4 10*3/uL (ref 0.7–4.0)
Lymphocytes Relative: 37 % (ref 12–46)
MCH: 29.4 pg (ref 26.0–34.0)
MCHC: 33 g/dL (ref 30.0–36.0)
MCV: 89 fL (ref 78.0–100.0)
MONOS PCT: 10 % (ref 3–12)
MPV: 10.3 fL (ref 8.6–12.4)
Monocytes Absolute: 0.6 10*3/uL (ref 0.1–1.0)
Neutro Abs: 3.1 10*3/uL (ref 1.7–7.7)
Neutrophils Relative %: 49 % (ref 43–77)
Platelets: 187 10*3/uL (ref 150–400)
RBC: 4.29 MIL/uL (ref 3.87–5.11)
RDW: 14.7 % (ref 11.5–15.5)
WBC: 6.4 10*3/uL (ref 4.0–10.5)

## 2014-12-08 NOTE — Progress Notes (Signed)
Patient ID: Kayla Baker, female   DOB: 06/27/1936, 79 y.o.   MRN: [id number]  Annual Comprehensive Examination  This very nice 79 y.o.MWF presents for complete physical.  Patient has been followed for HTN, T2_NIDDM  Prediabetes, Hyperlipidemia, and Vitamin D Deficiency.    HTN predates since  2000. Patient's BP has been controlled at home and patient denies any cardiac symptoms as chest pain, palpitations, shortness of breath, dizziness or ankle swelling. Today's BP: 122/78 mmHg    Patient's hyperlipidemia is controlled with diet and medications. Patient denies myalgias or other medication SE's. Last lipids were at goal - Total Chol 168; HDL 52; LDL 75; Trig 203 08/28/2014.   Patient has T2_NIDDM  predating since Dec 2011 and pt has CKD3 (GFR 45 ml/min) and painful peripheral neuropathy for which she takes lyrica & amitriptyline. Checks CBG's 2 x / wk and usu range 90-110 mg%. Patient denies reactive hypoglycemic symptoms, visual blurring, diabetic polys, but does have painful burning paresthesias of the sole of her feet. Last A1c was 6.2% on 08/28/2014.   Finally, patient has history of Vitamin D Deficiency (Vit D 33 in 2008) and last Vitamin D was  86 on 08/28/2014.  Medication Sig  . allopurinol  300 MG tablet TAKE 1 TABLET BY MOUTH EVERY DAY  . amitriptyline  10 MG tablet TAKE 2 TABLETS BY MOUTH AT BEDTIME  . aspirin 81 MG tablet Take 81 mg by mouth daily.  Marland Kitchen atorvastatin  40 MG tablet Take 1 tablet (40 mg total) by mouth daily.  Marland Kitchen VITAMIN D 1000 UNITS tablet Take 1,000 Units by mouth daily.  . enalapril  20 MG tablet TAKE 1 TABLET BY MOUTH DAILY  . estradiol  1 MG tablet TAKE 1 TABLET BY MOUTH DAILY  . Hctz 25 MG tablet TAKE 1 TABLET BY MOUTH DAILY  . Magnesium 250 MG TABS Take 250 mg by mouth 2 (two) times daily. Takes 2 tabs in am and 2 tabs in pm  . MULTIVITAMIN tablet Take 1 tablet by mouth daily.  Marland Kitchen FISH OIL 1200 MG CAPS Take by mouth daily. Takes 2 capsules daily  . omeprazole  40 MG  capsule TAKE ONE CAPSULE BY MOUTH ONCE A DAY  . pregabalin (LYRICA) 50 MG capsule Take 1 capsule (50 mg total) by mouth 3 (three) times daily.  . vitamin B-12  1000 MCG tablet Take 1,000 mcg by mouth daily. Takes 1 tablet every other day  . vitamin C ( 500 MG tablet Take 500 mg by mouth daily. Takes 2 tablets daily   Allergies  Allergen Reactions  . Keflex [Cephalexin] Swelling    Tongue and throat swells  . Meloxicam   . Prednisone     High doses  . Premarin [Estrogens Conjugated]   . Trovan [Alatrofloxacin]    Past Medical History  Diagnosis Date  . Hypertension   . Hyperlipidemia   . GERD (gastroesophageal reflux disease)   . Gout   . Allergy   . Diet-controlled type 2 diabetes mellitus   . Vitamin D deficiency   . Anemia    Health Maintenance  Topic Date Due  . OPHTHALMOLOGY EXAM  09/22/2014  . URINE MICROALBUMIN  11/10/2014  . FOOT EXAM  02/14/2015  . HEMOGLOBIN A1C  02/26/2015  . INFLUENZA VACCINE  07/02/2015  . COLONOSCOPY  03/31/2018  . TETANUS/TDAP  08/15/2019  . DEXA SCAN  Completed  . PNEUMOCOCCAL POLYSACCHARIDE VACCINE AGE 81 AND OVER  Completed  . ZOSTAVAX  Completed  Immunization History  Administered Date(s) Administered  . DTaP 07/01/2004  . Influenza Whole 08/15/2013  . Influenza, High Dose Seasonal PF 08/28/2014  . Pneumococcal Polysaccharide-23 11/01/2009  . Td 08/10/2009  . Zoster 08/02/2011   Past Surgical History  Procedure Laterality Date  . Cataract extraction, bilateral  2014    right eye 2/14; left eye 3/3  . Abdominal hysterectomy  1973  . Incontinence surgery  2006  . Knee arthroscopy      [id number] right  . Hand surgery  2012    left   Family History  Problem Relation Age of Onset  . Colon cancer Neg Hx   . Stomach cancer Neg Hx   . Stroke Mother   . Diabetes Father   . Hypertension Sister   . Cancer Sister   . Cancer Sister    History  Substance Use Topics  . Smoking status: Never Smoker   . Smokeless tobacco:  Never Used  . Alcohol Use: No    ROS Constitutional: Denies fever, chills, weight loss/gain, headaches, insomnia, fatigue, night sweats, and change in appetite. Eyes: Denies redness, blurred vision, diplopia, discharge, itchy, watery eyes.  ENT: Denies discharge, congestion, post nasal drip, epistaxis, sore throat, earache, hearing loss, dental pain, Tinnitus, Vertigo, Sinus pain, snoring.  Cardio: Denies chest pain, palpitations, irregular heartbeat, syncope, dyspnea, diaphoresis, orthopnea, PND, claudication, edema Respiratory: denies cough, dyspnea, DOE, pleurisy, hoarseness, laryngitis, wheezing.  Gastrointestinal: Denies dysphagia, heartburn, reflux, water brash, pain, cramps, nausea, vomiting, bloating, diarrhea, constipation, hematemesis, melena, hematochezia, jaundice, hemorrhoids Genitourinary: Denies dysuria, frequency, urgency, nocturia, hesitancy, discharge, hematuria, flank pain Breast: Breast lumps, nipple discharge, bleeding.  Musculoskeletal: Denies arthralgia, myalgia, stiffness, Jt. Swelling, pain, limp, and strain/sprain. Denies falls. Skin: Denies puritis, rash, hives, warts, acne, eczema, changing in skin lesion Neuro: No weakness, tremor, incoordination, spasms, paresthesia, pain Psychiatric: Denies confusion, memory loss, sensory loss. Denies Depression. Endocrine: Denies change in weight, skin, hair change, nocturia, and paresthesia, diabetic polys, visual blurring, hyper / hypo glycemic episodes.  Heme/Lymph: No excessive bleeding, bruising, enlarged lymph nodes.  Physical Exam  BP 122/78   Pulse 84  Temp 97.9 F  Resp 16  Ht 5' 3.5"   Wt 153 lb 3.2 oz     BMI 26.71   General Appearance: Well nourished and in no apparent distress. Eyes: PERRLA, EOMs, conjunctiva no swelling or erythema, normal fundi and vessels. Sinuses: No frontal/maxillary tenderness ENT/Mouth: EACs patent / TMs  nl. Nares clear without erythema, swelling, mucoid exudates. Oral hygiene is  good. No erythema, swelling, or exudate. Tongue normal, non-obstructing. Tonsils not swollen or erythematous. Hearing normal.  Neck: Supple, thyroid normal. No bruits, nodes or JVD. Respiratory: Respiratory effort normal.  BS equal and clear bilateral without rales, rhonci, wheezing or stridor. Cardio: Heart sounds are normal with regular rate and rhythm and no murmurs, rubs or gallops. Peripheral pulses are normal and equal bilaterally without edema. No aortic or femoral bruits. Chest: symmetric with normal excursions and percussion. Breasts: Symmetric, without lumps, nipple discharge, retractions, or fibrocystic changes.  Abdomen: Flat, soft, with bowl sounds. Nontender, no guarding, rebound, hernias, masses, or organomegaly.  Lymphatics: Non tender without lymphadenopathy.  Genitourinary:  Musculoskeletal: Full ROM all peripheral extremities, joint stability, 5/5 strength, and normal gait. Skin: Warm and dry without rashes, lesions, cyanosis, clubbing or  ecchymosis.  Neuro: Cranial nerves intact, reflexes equal bilaterally. Normal muscle tone, no cerebellar symptoms. Sensation decreased distally in a stocking distribution at the level of the proximal foot  by 2  pt discrimination testing with monofilament Pysch: Awake and oriented X 3, normal affect, Insight and Judgment appropriate.   Assessment and Plan  1. Hypertension  2. Hyperlipidemia 3. T2_NIDDM w/ CKD3 & Periph Neuropathy 4. Vitamin D Deficiency 5. Gout    Continue prudent diet as discussed, weight control, BP monitoring, regular exercise, and medications. Discussed med's effects and SE's. Screening labs and tests as requested with regular follow-up as recommended.

## 2014-12-08 NOTE — Patient Instructions (Signed)
Recommend the book "The END of DIETING" by Dr Baker Janus   and the book "The END of DIABETES " by Dr Excell Seltzer  At [email or web address] - get book & Audio CD's      Being diabetic has a  300% increased risk for heart attack, stroke, cancer, and alzheimer- type vascular dementia. It is very important that you work harder with diet by avoiding all foods that are white except chicken & fish. Avoid white rice (brown & wild rice is OK), white potatoes (sweetpotatoes in moderation is OK), White bread or wheat bread or anything made out of white flour like bagels, donuts, rolls, buns, biscuits, cakes, pastries, cookies, pizza crust, and pasta (made from white flour & egg whites) - vegetarian pasta or spinach or wheat pasta is OK. Multigrain breads like Arnold's or [employer], or multigrain sandwich thins or flatbreads.  Diet, exercise and weight loss can reverse and cure diabetes in the early stages.  Diet, exercise and weight loss is very important in the control and prevention of complications of diabetes which affects every system in your body, ie. Brain - dementia/stroke, eyes - glaucoma/blindness, heart - heart attack/heart failure, kidneys - dialysis, stomach - gastric paralysis, intestines - malabsorption, nerves - severe painful neuritis, circulation - gangrene & loss of a leg(s), and finally cancer and Alzheimers.    I recommend avoid fried & greasy foods,  sweets/candy, white rice (brown or wild rice or Quinoa is OK), white potatoes (sweet potatoes are OK) - anything made from white flour - bagels, doughnuts, rolls, buns, biscuits,white and wheat breads, pizza crust and traditional pasta made of white flour & egg white(vegetarian pasta or spinach or wheat pasta is OK).  Multi-grain bread is OK - like multi-grain flat bread or sandwich thins. Avoid alcohol in excess. Exercise is also important.    Eat all the vegetables you want - avoid meat, especially red meat and dairy - especially cheese.  Cheese  is the most concentrated form of trans-fats which is the worst thing to clog up our arteries. Veggie cheese is OK which can be found in the fresh produce section at [employer] or [employer] or [employer]  Preventive Care for Adults A healthy lifestyle and preventive care can promote health and wellness. Preventive health guidelines for women include the following key practices.  A routine yearly physical is a good way to check with your health care provider about your health and preventive screening. It is a chance to share any concerns and updates on your health and to receive a thorough exam.  Visit your dentist for a routine exam and preventive care every 6 months. Brush your teeth twice a day and floss once a day. Good oral hygiene prevents tooth decay and gum disease.  The frequency of eye exams is based on your age, health, family medical history, use of contact lenses, and other factors. Follow your health care provider's recommendations for frequency of eye exams.  Eat a healthy diet. Foods like vegetables, fruits, whole grains, low-fat dairy products, and lean protein foods contain the nutrients you need without too many calories. Decrease your intake of foods high in solid fats, added sugars, and salt. Eat the right amount of calories for you.Get information about a proper diet from your health care provider, if necessary.  Regular physical exercise is one of the most important things you can do for your health. Most adults should get at least 150 minutes of moderate-intensity exercise (any activity that increases  your heart rate and causes you to sweat) each week. In addition, most adults need muscle-strengthening exercises on 2 or more days a week.  Maintain a healthy weight. The body mass index (BMI) is a screening tool to identify possible weight problems. It provides an estimate of body fat based on height and weight. Your health care provider can find your BMI and can help you  achieve or maintain a healthy weight.For adults 20 years and older:  A BMI below 18.5 is considered underweight.  A BMI of 18.5 to 24.9 is normal.  A BMI of 25 to 29.9 is considered overweight.  A BMI of 30 and above is considered obese.  Maintain normal blood lipids and cholesterol levels by exercising and minimizing your intake of saturated fat. Eat a balanced diet with plenty of fruit and vegetables. If your lipid or cholesterol levels are high, you are over 50, or you are at high risk for heart disease, you may need your cholesterol levels checked more frequently.Ongoing high lipid and cholesterol levels should be treated with medicines if diet and exercise are not working.  If you smoke, find out from your health care provider how to quit. If you do not use tobacco, do not start.  Lung cancer screening is recommended for adults aged 55-80 years who are at high risk for developing lung cancer because of a history of smoking. A yearly low-dose CT scan of the lungs is recommended for people who have at least a 30-pack-year history of smoking and are a current smoker or have quit within the past 15 years. A pack year of smoking is smoking an average of 1 pack of cigarettes a day for 1 year (for example: 1 pack a day for 30 years or 2 packs a day for 15 years). Yearly screening should continue until the smoker has stopped smoking for at least 15 years. Yearly screening should be stopped for people who develop a health problem that would prevent them from having lung cancer treatment.  Avoid use of street drugs. Do not share needles with anyone. Ask for help if you need support or instructions about stopping the use of drugs.  High blood pressure causes heart disease and increases the risk of stroke.  Ongoing high blood pressure should be treated with medicines if weight loss and exercise do not work.  If you are 55-79 years old, ask your health care provider if you should take aspirin to  prevent strokes.  Diabetes screening involves taking a blood sample to check your fasting blood sugar level. This should be done once every 3 years, after age 45, if you are within normal weight and without risk factors for diabetes. Testing should be considered at a younger age or be carried out more frequently if you are overweight and have at least 1 risk factor for diabetes.  Breast cancer screening is essential preventive care for women. You should practice "breast self-awareness." This means understanding the normal appearance and feel of your breasts and may include breast self-examination. Any changes detected, no matter how small, should be reported to a health care provider. Women in their 20s and 30s should have a clinical breast exam (CBE) by a health care provider as part of a regular health exam every 1 to 3 years. After age 40, women should have a CBE every year. Starting at age 40, women should consider having a mammogram (breast X-ray test) every year. Women who have a family history of breast cancer should   talk to their health care provider about genetic screening. Women at a high risk of breast cancer should talk to their health care providers about having an MRI and a mammogram every year.  Breast cancer gene (BRCA)-related cancer risk assessment is recommended for women who have family members with BRCA-related cancers. BRCA-related cancers include breast, ovarian, tubal, and peritoneal cancers. Having family members with these cancers may be associated with an increased risk for harmful changes (mutations) in the breast cancer genes BRCA1 and BRCA2. Results of the assessment will determine the need for genetic counseling and BRCA1 and BRCA2 testing.  Routine pelvic exams to screen for cancer are no longer recommended for nonpregnant women who are considered low risk for cancer of the pelvic organs (ovaries, uterus, and vagina) and who do not have symptoms. Ask your health care provider  if a screening pelvic exam is right for you.  If you have had past treatment for cervical cancer or a condition that could lead to cancer, you need Pap tests and screening for cancer for at least 20 years after your treatment. If Pap tests have been discontinued, your risk factors (such as having a new sexual partner) need to be reassessed to determine if screening should be resumed. Some women have medical problems that increase the chance of getting cervical cancer. In these cases, your health care provider may recommend more frequent screening and Pap tests.    Colorectal cancer can be detected and often prevented. Most routine colorectal cancer screening begins at the age of 90 years and continues through age 8 years. However, your health care provider may recommend screening at an earlier age if you have risk factors for colon cancer. On a yearly basis, your health care provider may provide home test kits to check for hidden blood in the stool. Use of a small camera at the end of a tube, to directly examine the colon (sigmoidoscopy or colonoscopy), can detect the earliest forms of colorectal cancer. Talk to your health care provider about this at age 86, when routine screening begins. Direct exam of the colon should be repeated every 5-10 years through age 32 years, unless early forms of pre-cancerous polyps or small growths are found.  Osteoporosis is a disease in which the bones lose minerals and strength with aging. This can result in serious bone fractures or breaks. The risk of osteoporosis can be identified using a bone density scan. Women ages 75 years and over and women at risk for fractures or osteoporosis should discuss screening with their health care providers. Ask your health care provider whether you should take a calcium supplement or vitamin D to reduce the rate of osteoporosis.  Menopause can be associated with physical symptoms and risks. Hormone replacement therapy is available to  decrease symptoms and risks. You should talk to your health care provider about whether hormone replacement therapy is right for you.  Use sunscreen. Apply sunscreen liberally and repeatedly throughout the day. You should seek shade when your shadow is shorter than you. Protect yourself by wearing long sleeves, pants, a wide-brimmed hat, and sunglasses year round, whenever you are outdoors.  Once a month, do a whole body skin exam, using a mirror to look at the skin on your back. Tell your health care provider of new moles, moles that have irregular borders, moles that are larger than a pencil eraser, or moles that have changed in shape or color.  Stay current with required vaccines (immunizations).  Influenza vaccine. All adults  should be immunized every year.  Tetanus, diphtheria, and acellular pertussis (Td, Tdap) vaccine. Pregnant women should receive 1 dose of Tdap vaccine during each pregnancy. The dose should be obtained regardless of the length of time since the last dose. Immunization is preferred during the 27th-36th week of gestation. An adult who has not previously received Tdap or who does not know her vaccine status should receive 1 dose of Tdap. This initial dose should be followed by tetanus and diphtheria toxoids (Td) booster doses every 10 years. Adults with an unknown or incomplete history of completing a 3-dose immunization series with Td-containing vaccines should begin or complete a primary immunization series including a Tdap dose. Adults should receive a Td booster every 10 years.    Zoster vaccine. One dose is recommended for adults aged 44 years or older unless certain conditions are present.    Pneumococcal 13-valent conjugate (PCV13) vaccine. When indicated, a person who is uncertain of her immunization history and has no record of immunization should receive the PCV13 vaccine. An adult aged 74 years or older who has certain medical conditions and has not been previously  immunized should receive 1 dose of PCV13 vaccine. This PCV13 should be followed with a dose of pneumococcal polysaccharide (PPSV23) vaccine. The PPSV23 vaccine dose should be obtained at least 8 weeks after the dose of PCV13 vaccine. An adult aged 66 years or older who has certain medical conditions and previously received 1 or more doses of PPSV23 vaccine should receive 1 dose of PCV13. The PCV13 vaccine dose should be obtained 1 or more years after the last PPSV23 vaccine dose.    Pneumococcal polysaccharide (PPSV23) vaccine. When PCV13 is also indicated, PCV13 should be obtained first. All adults aged 89 years and older should be immunized. An adult younger than age 80 years who has certain medical conditions should be immunized. Any person who resides in a nursing home or long-term care facility should be immunized. An adult smoker should be immunized. People with an immunocompromised condition and certain other conditions should receive both PCV13 and PPSV23 vaccines. People with human immunodeficiency virus (HIV) infection should be immunized as soon as possible after diagnosis. Immunization during chemotherapy or radiation therapy should be avoided. Routine use of PPSV23 vaccine is not recommended for American Indians, [place] Natives, or people younger than 65 years unless there are medical conditions that require PPSV23 vaccine. When indicated, people who have unknown immunization and have no record of immunization should receive PPSV23 vaccine. One-time revaccination 5 years after the first dose of PPSV23 is recommended for people aged 19-64 years who have chronic kidney failure, nephrotic syndrome, asplenia, or immunocompromised conditions. People who received 1-2 doses of PPSV23 before age 34 years should receive another dose of PPSV23 vaccine at age 31 years or later if at least 5 years have passed since the previous dose. Doses of PPSV23 are not needed for people immunized with PPSV23 at or after  age 29 years.   Preventive Services / Frequency  Ages 55 years and over  Blood pressure check.  Lipid and cholesterol check.  Lung cancer screening. / Every year if you are aged 29-80 years and have a 30-pack-year history of smoking and currently smoke or have quit within the past 15 years. Yearly screening is stopped once you have quit smoking for at least 15 years or develop a health problem that would prevent you from having lung cancer treatment.  Clinical breast exam.** / Every year after age 40 years.  BRCA-related cancer risk assessment.** / For women who have family members with a BRCA-related cancer (breast, ovarian, tubal, or peritoneal cancers).  Mammogram.** / Every year beginning at age 48 years and continuing for as long as you are in good health. Consult with your health care provider.  Pap test.** / Every 3 years starting at age 11 years through age 10 or 67 years with 3 consecutive normal Pap tests. Testing can be stopped between 65 and 70 years with 3 consecutive normal Pap tests and no abnormal Pap or HPV tests in the past 10 years.  Fecal occult blood test (FOBT) of stool. / Every year beginning at age 66 years and continuing until age 57 years. You may not need to do this test if you get a colonoscopy every 10 years.  Flexible sigmoidoscopy or colonoscopy.** / Every 5 years for a flexible sigmoidoscopy or every 10 years for a colonoscopy beginning at age 68 years and continuing until age 14 years.  Hepatitis C blood test.** / For all people born from [birth year] through 1963 and any individual with known risks for hepatitis C.  Osteoporosis screening.** / A one-time screening for women ages 72 years and over and women at risk for fractures or osteoporosis.  Skin self-exam. / Monthly.  Influenza vaccine. / Every year.  Tetanus, diphtheria, and acellular pertussis (Tdap/Td) vaccine.** / 1 dose of Td every 10 years.  Zoster vaccine.** / 1 dose for adults aged 20 years  or older.  PREVNAR -13 :Pneumococcal 13-valent conjugate (PCV13) vaccine.** / Consult your health care provider.  PNEUMOVAX - 23 : Pneumococcal polysaccharide (PPSV23) vaccine.** / 1 dose for all adults aged 87 years and older.           Screening for abdominal aortic aneurysm (AAA)  by ultrasound is recommended for people who have history of high blood pressure or who are current or former smokers.

## 2014-12-09 LAB — HEPATIC FUNCTION PANEL
ALBUMIN: 4 g/dL (ref 3.5–5.2)
ALK PHOS: 89 U/L (ref 39–117)
ALT: 25 U/L (ref 0–35)
AST: 25 U/L (ref 0–37)
BILIRUBIN INDIRECT: 0.3 mg/dL (ref 0.2–1.2)
BILIRUBIN TOTAL: 0.4 mg/dL (ref 0.2–1.2)
Bilirubin, Direct: 0.1 mg/dL (ref 0.0–0.3)
Total Protein: 6.6 g/dL (ref 6.0–8.3)

## 2014-12-09 LAB — BASIC METABOLIC PANEL WITH GFR
BUN: 24 mg/dL — ABNORMAL HIGH (ref 6–23)
CO2: 29 mEq/L (ref 19–32)
Calcium: 9.5 mg/dL (ref 8.4–10.5)
Chloride: 104 mEq/L (ref 96–112)
Creat: 1.2 mg/dL — ABNORMAL HIGH (ref 0.50–1.10)
GFR, Est African American: 51 mL/min — ABNORMAL LOW
GFR, Est Non African American: 44 mL/min — ABNORMAL LOW
GLUCOSE: 84 mg/dL (ref 70–99)
POTASSIUM: 4.1 meq/L (ref 3.5–5.3)
Sodium: 141 mEq/L (ref 135–145)

## 2014-12-09 LAB — HEMOGLOBIN A1C
Hgb A1c MFr Bld: 6.4 % — ABNORMAL HIGH (ref <5.7)
Mean Plasma Glucose: 137 mg/dL — ABNORMAL HIGH (ref <117)

## 2014-12-09 LAB — URINALYSIS, MICROSCOPIC ONLY
Bacteria, UA: NONE SEEN
CRYSTALS: NONE SEEN
Casts: NONE SEEN
SQUAMOUS EPITHELIAL / LPF: NONE SEEN

## 2014-12-09 LAB — LIPID PANEL
Cholesterol: 182 mg/dL (ref 0–200)
HDL: 53 mg/dL (ref >39)
LDL CALC: 96 mg/dL (ref 0–99)
TRIGLYCERIDES: 165 mg/dL — AB (ref <150)
Total CHOL/HDL Ratio: 3.4 Ratio
VLDL: 33 mg/dL (ref 0–40)

## 2014-12-09 LAB — MAGNESIUM: Magnesium: 1.7 mg/dL (ref 1.5–2.5)

## 2014-12-09 LAB — TSH: TSH: 2.574 u[IU]/mL (ref 0.350–4.500)

## 2014-12-09 LAB — MICROALBUMIN / CREATININE URINE RATIO
CREATININE, URINE: 55 mg/dL
Microalb Creat Ratio: 3.6 mg/g (ref 0.0–30.0)
Microalb, Ur: 0.2 mg/dL (ref <2.0)

## 2014-12-09 LAB — INSULIN, FASTING: INSULIN FASTING, SERUM: 16.5 u[IU]/mL (ref 2.0–19.6)

## 2014-12-09 LAB — VITAMIN D 25 HYDROXY (VIT D DEFICIENCY, FRACTURES): VIT D 25 HYDROXY: 59 ng/mL (ref 30–100)

## 2014-12-09 LAB — URIC ACID: Uric Acid, Serum: 4.3 mg/dL (ref 2.4–7.0)

## 2014-12-26 ENCOUNTER — Other Ambulatory Visit: Payer: Self-pay | Admitting: *Deleted

## 2014-12-26 DIAGNOSIS — Z1212 Encounter for screening for malignant neoplasm of rectum: Secondary | ICD-10-CM

## 2014-12-26 LAB — POC HEMOCCULT BLD/STL (HOME/3-CARD/SCREEN)
Card #2 Fecal Occult Blod, POC: NEGATIVE
Card #3 Fecal Occult Blood, POC: NEGATIVE
Fecal Occult Blood, POC: NEGATIVE

## 2015-01-01 ENCOUNTER — Encounter: Payer: Self-pay | Admitting: Internal Medicine

## 2015-01-01 ENCOUNTER — Other Ambulatory Visit: Payer: Self-pay | Admitting: *Deleted

## 2015-01-01 MED ORDER — HYDROCHLOROTHIAZIDE 25 MG PO TABS: 25.0000 mg | ORAL_TABLET | Freq: Every day | ORAL | Status: DC

## 2015-01-15 ENCOUNTER — Encounter: Payer: Self-pay | Admitting: *Deleted

## 2015-03-16 ENCOUNTER — Encounter: Payer: Self-pay | Admitting: Physician Assistant

## 2015-03-16 ENCOUNTER — Ambulatory Visit (INDEPENDENT_AMBULATORY_CARE_PROVIDER_SITE_OTHER): Payer: PPO | Admitting: Physician Assistant

## 2015-03-16 VITALS — BP 118/70 | HR 78 | Temp 98.0°F | Resp 16 | Ht 63.5 in | Wt 152.0 lb

## 2015-03-16 DIAGNOSIS — Z789 Other specified health status: Secondary | ICD-10-CM

## 2015-03-16 DIAGNOSIS — E782 Mixed hyperlipidemia: Secondary | ICD-10-CM

## 2015-03-16 DIAGNOSIS — E559 Vitamin D deficiency, unspecified: Secondary | ICD-10-CM

## 2015-03-16 DIAGNOSIS — Z0001 Encounter for general adult medical examination with abnormal findings: Secondary | ICD-10-CM

## 2015-03-16 DIAGNOSIS — E1140 Type 2 diabetes mellitus with diabetic neuropathy, unspecified: Secondary | ICD-10-CM

## 2015-03-16 DIAGNOSIS — Z79899 Other long term (current) drug therapy: Secondary | ICD-10-CM

## 2015-03-16 DIAGNOSIS — E1129 Type 2 diabetes mellitus with other diabetic kidney complication: Secondary | ICD-10-CM

## 2015-03-16 DIAGNOSIS — I10 Essential (primary) hypertension: Secondary | ICD-10-CM

## 2015-03-16 DIAGNOSIS — C44311 Basal cell carcinoma of skin of nose: Secondary | ICD-10-CM

## 2015-03-16 DIAGNOSIS — R6889 Other general symptoms and signs: Secondary | ICD-10-CM

## 2015-03-16 DIAGNOSIS — M791 Myalgia, unspecified site: Secondary | ICD-10-CM

## 2015-03-16 DIAGNOSIS — M1000 Idiopathic gout, unspecified site: Secondary | ICD-10-CM

## 2015-03-16 DIAGNOSIS — Z1331 Encounter for screening for depression: Secondary | ICD-10-CM

## 2015-03-16 LAB — HEMOGLOBIN A1C
Hgb A1c MFr Bld: 6.4 % — ABNORMAL HIGH (ref <5.7)
Mean Plasma Glucose: 137 mg/dL — ABNORMAL HIGH (ref <117)

## 2015-03-16 LAB — CBC WITH DIFFERENTIAL/PLATELET
BASOS PCT: 1 % (ref 0–1)
Basophils Absolute: 0.1 10*3/uL (ref 0.0–0.1)
EOS ABS: 0.3 10*3/uL (ref 0.0–0.7)
Eosinophils Relative: 5 % (ref 0–5)
HCT: 38.1 % (ref 36.0–46.0)
Hemoglobin: 12.8 g/dL (ref 12.0–15.0)
LYMPHS ABS: 2.5 10*3/uL (ref 0.7–4.0)
LYMPHS PCT: 41 % (ref 12–46)
MCH: 29.5 pg (ref 26.0–34.0)
MCHC: 33.6 g/dL (ref 30.0–36.0)
MCV: 87.8 fL (ref 78.0–100.0)
MPV: 10.1 fL (ref 8.6–12.4)
Monocytes Absolute: 0.6 10*3/uL (ref 0.1–1.0)
Monocytes Relative: 10 % (ref 3–12)
Neutro Abs: 2.7 10*3/uL (ref 1.7–7.7)
Neutrophils Relative %: 43 % (ref 43–77)
PLATELETS: 188 10*3/uL (ref 150–400)
RBC: 4.34 MIL/uL (ref 3.87–5.11)
RDW: 14.6 % (ref 11.5–15.5)
WBC: 6.2 10*3/uL (ref 4.0–10.5)

## 2015-03-16 NOTE — Progress Notes (Addendum)
MEDICARE WELLNESS VISIT AND 3 MONTH FU  Assessment:   1. Essential hypertension - continue medications, DASH diet, exercise and monitor at home. Call if greater than 130/80.  - CBC with Differential/Platelet - BASIC METABOLIC PANEL WITH GFR - Hepatic function panel - TSH  2. Type 2 diabetes mellitus with other diabetic kidney complication Discussed general issues about diabetes pathophysiology and management., Educational material distributed., Suggested low cholesterol diet., Encouraged aerobic exercise., Discussed foot care., Reminded to get yearly retinal exam. - Hemoglobin A1c - Insulin, fasting  3. Diabetic neuropathy, painful Continue medications - HM DIABETES FOOT EXAM  4. Hyperlipidemia -continue medications, check lipids, decrease fatty foods, increase activity.  - Lipid panel  5. Idiopathic gout, unspecified chronicity, unspecified site Gout- recheck Uric acid as needed, Diet discussed, continue medications  6. Vitamin D deficiency - Vit D  25 hydroxy (rtn osteoporosis monitoring)  7. Medication management - Magnesium  8. Screening for depression negative  9. Basal cell carcinoma of nose - Ambulatory referral to Dermatology  10. Myalgia - CK   Plan:   During the course of the visit the patient was educated and counseled about appropriate screening and preventive services including:    Screening electrocardiogram  Screening mammography  Bone densitometry screening  Colorectal cancer screening  Diabetes screening  Glaucoma screening  Nutrition counseling   Conditions/risks identified: BMI: Discussed weight loss, diet, and increase physical activity.  Increase physical activity: AHA recommends 150 minutes of physical activity a week.  Medications reviewed DEXA- ordered Diabetes is at goal, ACE/ARB therapy: Yes. Urinary Incontinence is not an issue: discussed non pharmacology and pharmacology options.  Fall risk: low- discussed PT, home fall  assessment, medications.   Subjective:   Kayla Baker is a 79 y.o. female who presents for Medicare Annual Wellness Visit and 3 month follow up on hypertension, prediabetes, hyperlipidemia, vitamin D def.  Date of last medicare wellness visit was 01/2014  Her blood pressure has been controlled at home, today their BP is BP: 118/70 mmHg She does workout, goes to the [facility] for water classes .  She denies chest pain, shortness of breath, dizziness.  She is on cholesterol medication and denies myalgias. Her cholesterol is at goal. The cholesterol last visit was:   Lab Results  Component Value Date   CHOL 182 12/08/2014   HDL 53 12/08/2014   LDLCALC 96 12/08/2014   TRIG 165* 12/08/2014   CHOLHDL 3.4 12/08/2014   She has been working on diet and exercise for diabetes with CKD and neuropathy, she is on bASA, lipitor, and on ACE and denies foot ulcerations, hypoglycemia , nausea, polydipsia and polyuria. Last A1C in the office was:  Lab Results  Component Value Date   HGBA1C 6.4* 12/08/2014  She does have DM neuropathy, she was started on lyrica Sept last week. She states that in November she started to have blurred vision, she was told to start 2 amitriptyline at bed time, 1 in the morning and 1 at night.  She is complaining of more bilateral knee, bilateral hip and wrist pain. She feels that she has weakness with going into a sitting position, and worse pain with standing or squatting. Has seen Dr. Lauro Regulus in the past Denies lower back pain, radiation down her legs, incontinence. .  Patient is on Vitamin D supplement. Patient is on allopurinol for gout and does not report a recent flare.  Lab Results  Component Value Date   LABURIC 4.3 12/08/2014  Area on her nose x  6 month, erythematous red, scaly area.   Medication Review Current Outpatient Prescriptions on File Prior to Visit  Medication Sig Dispense Refill  . allopurinol (ZYLOPRIM) 300 MG tablet TAKE 1 TABLET BY MOUTH EVERY DAY 90  tablet PRN  . amitriptyline (ELAVIL) 10 MG tablet TAKE 2 TABLETS BY MOUTH AT BEDTIME 60 tablet 99  . aspirin 81 MG tablet Take 81 mg by mouth daily.    Marland Kitchen atorvastatin (LIPITOR) 80 MG tablet Take 80 mg by mouth daily.    . cholecalciferol (VITAMIN D) 1000 UNITS tablet Take 1,000 Units by mouth daily.    . enalapril (VASOTEC) 20 MG tablet TAKE 1 TABLET BY MOUTH DAILY 90 tablet 98  . estradiol (ESTRACE) 1 MG tablet TAKE 1 TABLET BY MOUTH DAILY 90 tablet 2  . FREESTYLE LITE test strip USE ONE STRIP TO CHECK GLUCOSE ONCE DAILY. 100 each 0  . hydrochlorothiazide (HYDRODIURIL) 25 MG tablet Take 1 tablet (25 mg total) by mouth daily. 90 tablet 0  . Magnesium 250 MG TABS Take 250 mg by mouth 2 (two) times daily. Takes 2 tabs in am and 2 tabs in pm    . Multiple Vitamin (MULTIVITAMIN) tablet Take 1 tablet by mouth daily.    . Omega-3 Fatty Acids (FISH OIL) 1200 MG CAPS Take by mouth daily. Takes 2 capsules daily    . omeprazole (PRILOSEC) 40 MG capsule TAKE ONE CAPSULE BY MOUTH ONCE A DAY 30 capsule 99  . pregabalin (LYRICA) 50 MG capsule Take 1 capsule (50 mg total) by mouth 3 (three) times daily. 90 capsule 5  . vitamin B-12 (CYANOCOBALAMIN) 1000 MCG tablet Take 1,000 mcg by mouth daily. Takes 1 tablet every other day    . vitamin C (ASCORBIC ACID) 500 MG tablet Take 500 mg by mouth daily. Takes 2 tablets daily     No current facility-administered medications on file prior to visit.    Current Problems (verified) Patient Active Problem List   Diagnosis Date Noted  . Gout 12/08/2014  . Diabetic neuropathy, painful 08/28/2014  . Medication management 08/27/2014  . Essential hypertension 11/10/2013  . Hyperlipidemia 11/10/2013  . T2_NIDDM w/CKD3 (GFR 45 ml/min) 11/10/2013  . Vitamin D deficiency 11/10/2013    Screening Tests Health Maintenance  Topic Date Due  . PNA vac Low Risk Adult (2 of 2 - PCV13) 11/01/2010  . HEMOGLOBIN A1C  06/08/2015  . INFLUENZA VACCINE  07/02/2015  .  OPHTHALMOLOGY EXAM  11/01/2015  . FOOT EXAM  12/09/2015  . URINE MICROALBUMIN  12/09/2015  . COLONOSCOPY  03/31/2018  . TETANUS/TDAP  08/15/2019  . DEXA SCAN  Completed  . ZOSTAVAX  Completed     Immunization History  Administered Date(s) Administered  . DTaP 07/01/2004  . Influenza Whole 08/15/2013  . Influenza, High Dose Seasonal PF 08/28/2014  . Pneumococcal Polysaccharide-23 11/01/2009  . Td 08/10/2009  . Zoster 08/02/2011   Preventative care: Last colonoscopy: 03/2013 due 5 years Last mammogram : 01/2014 Last pap smear/pelvic exam: remote s/p TAH DEXA: 05/2014 NORMAL- she is on estrogen MRI brain 2007  Prior vaccinations: TD or Tdap: 2010  Influenza: 2014 Pneumococcal: 2010 Prevnar 13: Do not have in the office Shingles/Zostavax: 2012  Names of Other Physician/Practitioners you currently use: 1. [facility]- here for primary care 2. Dr. Luretha Rued, Dr. Talbert Forest, eye doctor, last visit 10/31/2014 readers 3. Dr. Jolayne Panther, dentist, last visit 02/08/2014 Patient Care Team: [name], MD as PCP - [place], OD as  Referring Physician (Optometry) [name], MD as Consulting Physician (Dermatology) Latanya Maudlin, MD as Consulting Physician (Orthopedic Surgery) Inda Castle, MD as Consulting Physician (Gastroenterology)    Allergies Allergies  Allergen Reactions  . Keflex [Cephalexin] Swelling    Tongue and throat swells  . Meloxicam   . Prednisone     High doses  . Premarin [Estrogens Conjugated]   . Ruben Im [Alatrofloxacin]    Surgical history Past Surgical History  Procedure Laterality Date  . Cataract extraction, bilateral  2014    right eye 2/14; left eye 3/3  . Abdominal hysterectomy  1973  . Incontinence surgery  2006  . Knee arthroscopy      [id number] right  . Hand surgery  2012    left   Family history Family History  Problem Relation Age of Onset  . Colon cancer Neg Hx   . Stomach cancer  Neg Hx   . Stroke Mother   . Diabetes Father   . Hypertension Sister   . Cancer Sister   . Cancer Sister    Risk Factors: Osteoporosis: postmenopausal estrogen deficiency and dietary calcium and/or vitamin D deficiency History of fracture in the past year: no  Tobacco History  Substance Use Topics  . Smoking status: Never Smoker   . Smokeless tobacco: Never Used  . Alcohol Use: No   She does not smoke.  Patient is not a former smoker. Are there smokers in your home (other than you)?  No  Alcohol Current alcohol use: none  Caffeine Current caffeine use: coffee 1-2 /day  Exercise Exercise limitations: None Current exercise: aerobics, swimming and yard work  Nutrition/Diet Current diet: in general, a "healthy" diet    Cardiac risk factors: advanced age (older than 57 for men, 78 for women), diabetes mellitus, dyslipidemia and hypertension.  Depression Screen  Q1: Over the past two weeks, have you felt down, depressed or hopeless? No  Q2: Over the past two weeks, have you felt little interest or pleasure in doing things? No  Have you lost interest or pleasure in daily life? No  Do you often feel hopeless? No  Do you cry easily over simple problems? No  Activities of Daily Living Driving? No Managing money?  No Feeding yourself? No Getting from bed to chair? No Climbing a flight of stairs? No Preparing food and eating?: No Bathing or showering? No Getting dressed: No Getting to the toilet? No Using the toilet:No Moving around from place to place: No In the past year have you fallen or had a near fall?:No   Are you sexually active?  Yes  Do you have more than one partner?  No  Vision Difficulties: No  Hearing Difficulties: No Do you often ask people to speak up or repeat themselves? No Do you experience ringing or noises in your ears? No Do you have difficulty understanding soft or whispered voices? No  Cognition  Do you feel that you have a problem  with memory?No  Do you often misplace items? No  Do you feel safe at home?  Yes  Advanced directives Does patient have a Revloc? Yes Does patient have a Living Will? No   Objective:    Blood pressure 118/70, pulse 78, temperature 98 F (36.7 C), temperature source Temporal, resp. rate 16, height 5' 3.5" (1.613 m), weight 152 lb (68.947 kg). Body mass index is 26.5 kg/(m^2).  General appearance: alert, no distress, WD/WN,  female Cognitive Testing  Alert? Yes  Normal  Appearance?Yes  Oriented to person? Yes  Place? Yes   Time? Yes  Recall of three objects?  Yes  Can perform simple calculations? Yes  Displays appropriate judgment?Yes  Can read the correct time from a watch face?Yes  HEENT: normocephalic, sclerae anicteric, TMs pearly, nares patent, no discharge or erythema, pharynx normal Oral cavity: MMM, no lesions Neck: supple, no lymphadenopathy, no thyromegaly, no masses Heart: RRR, normal S1, S2, no murmurs Lungs: CTA bilaterally, no wheezes, rhonchi, or rales Abdomen: +bs, soft, non tender, non distended, no masses, no hepatomegaly, no splenomegaly Musculoskeletal: nontender, no swelling, no obvious deformity Extremities: no edema, no cyanosis, no clubbing Pulses: 2+ symmetric, upper and lower extremities, normal cap refill Neurological: alert, oriented x 3, CN2-12 intact, strength normal upper extremities and lower extremities, sensation decreased bilateral feet , DTRs 2+ throughout, no cerebellar signs, gait normal Skin: several scattered seb keratosis on back/chest, erythematous scaly area with telangiectasias on left tip of nose 3x26m Psychiatric: normal affect, behavior normal, pleasant  Breast: defer Gyn: defer Rectal: defer  Medicare Attestation I have personally reviewed: The patient's medical and social history Their use of alcohol, tobacco or illicit drugs Their current medications and supplements The patient's functional ability  including ADLs,fall risks, home safety risks, cognitive, and hearing and visual impairment Diet and physical activities Evidence for depression or mood disorders  The patient's weight, height, BMI, and visual acuity have been recorded in the chart.  I have made referrals, counseling, and provided education to the patient based on review of the above and I have provided the patient with a written personalized care plan for preventive services.     CVicie Mutters PA-C   03/16/2015

## 2015-03-16 NOTE — Patient Instructions (Addendum)
You can stop the lyrica, you may take 1 amitriptyline in the AM and the two at night.    HOME CARE INSTRUCTIONS   Do not stand or sit in one position for long periods of time. Do not sit with your legs crossed. Rest with your legs raised during the day.  Your legs have to be higher than your heart so that gravity will force the valves to open, so please really elevate your legs.   Wear elastic stockings or support hose. Do not wear other tight, encircling garments around the legs, pelvis, or waist.  [employer]  has a wide variety of well priced compression stockings. [place], [place] [id number] [phone]  Walk as much as possible to increase blood flow.  Raise the foot of your bed at night with 2-inch blocks. SEEK MEDICAL CARE IF:   The skin around your ankle starts to break down.  You have pain, redness, tenderness, or hard swelling developing in your leg over a vein.  You are uncomfortable due to leg pain. Document Released: 08/27/2005 Document Revised: 02/09/2012 Document Reviewed: 01/13/2011 [facility] Patient Information 2014 [employer].  Preventive Care for Adults A healthy lifestyle and preventive care can promote health and wellness. Preventive health guidelines for women include the following key practices.  A routine yearly physical is a good way to check with your health care provider about your health and preventive screening. It is a chance to share any concerns and updates on your health and to receive a thorough exam.  Visit your dentist for a routine exam and preventive care every 6 months. Brush your teeth twice a day and floss once a day. Good oral hygiene prevents tooth decay and gum disease.  The frequency of eye exams is based on your age, health, family medical history, use of contact lenses, and other factors. Follow your health care provider's recommendations for frequency of eye exams.  Eat a healthy diet. Foods like vegetables,  fruits, whole grains, low-fat dairy products, and lean protein foods contain the nutrients you need without too many calories. Decrease your intake of foods high in solid fats, added sugars, and salt. Eat the right amount of calories for you.Get information about a proper diet from your health care provider, if necessary.  Regular physical exercise is one of the most important things you can do for your health. Most adults should get at least 150 minutes of moderate-intensity exercise (any activity that increases your heart rate and causes you to sweat) each week. In addition, most adults need muscle-strengthening exercises on 2 or more days a week.  Maintain a healthy weight. The body mass index (BMI) is a screening tool to identify possible weight problems. It provides an estimate of body fat based on height and weight. Your health care provider can find your BMI and can help you achieve or maintain a healthy weight.For adults 20 years and older:  A BMI below 18.5 is considered underweight.  A BMI of 18.5 to 24.9 is normal.  A BMI of 25 to 29.9 is considered overweight.  A BMI of 30 and above is considered obese.  Maintain normal blood lipids and cholesterol levels by exercising and minimizing your intake of saturated fat. Eat a balanced diet with plenty of fruit and vegetables. If your lipid or cholesterol levels are high, you are over 50, or you are at high risk for heart disease, you may need your cholesterol levels checked more frequently.Ongoing high lipid  and cholesterol levels should be treated with medicines if diet and exercise are not working.  If you smoke, find out from your health care provider how to quit. If you do not use tobacco, do not start.  Lung cancer screening is recommended for adults aged 53-80 years who are at high risk for developing lung cancer because of a history of smoking. A yearly low-dose CT scan of the lungs is recommended for people who have at least a  30-pack-year history of smoking and are a current smoker or have quit within the past 15 years. A pack year of smoking is smoking an average of 1 pack of cigarettes a day for 1 year (for example: 1 pack a day for 30 years or 2 packs a day for 15 years). Yearly screening should continue until the smoker has stopped smoking for at least 15 years. Yearly screening should be stopped for people who develop a health problem that would prevent them from having lung cancer treatment.  Avoid use of street drugs. Do not share needles with anyone. Ask for help if you need support or instructions about stopping the use of drugs.  High blood pressure causes heart disease and increases the risk of stroke.  Ongoing high blood pressure should be treated with medicines if weight loss and exercise do not work.  If you are 3-53 years old, ask your health care provider if you should take aspirin to prevent strokes.  Diabetes screening involves taking a blood sample to check your fasting blood sugar level. This should be done once every 3 years, after age 23, if you are within normal weight and without risk factors for diabetes. Testing should be considered at a younger age or be carried out more frequently if you are overweight and have at least 1 risk factor for diabetes.  Breast cancer screening is essential preventive care for women. You should practice "breast self-awareness." This means understanding the normal appearance and feel of your breasts and may include breast self-examination. Any changes detected, no matter how small, should be reported to a health care provider. Women in their 32s and 30s should have a clinical breast exam (CBE) by a health care provider as part of a regular health exam every 1 to 3 years. After age 47, women should have a CBE every year. Starting at age 61, women should consider having a mammogram (breast X-ray test) every year. Women who have a family history of breast cancer should talk  to their health care provider about genetic screening. Women at a high risk of breast cancer should talk to their health care providers about having an MRI and a mammogram every year.  Breast cancer gene (BRCA)-related cancer risk assessment is recommended for women who have family members with BRCA-related cancers. BRCA-related cancers include breast, ovarian, tubal, and peritoneal cancers. Having family members with these cancers may be associated with an increased risk for harmful changes (mutations) in the breast cancer genes BRCA1 and BRCA2. Results of the assessment will determine the need for genetic counseling and BRCA1 and BRCA2 testing.  Routine pelvic exams to screen for cancer are no longer recommended for nonpregnant women who are considered low risk for cancer of the pelvic organs (ovaries, uterus, and vagina) and who do not have symptoms. Ask your health care provider if a screening pelvic exam is right for you.  If you have had past treatment for cervical cancer or a condition that could lead to cancer, you need Pap tests  and screening for cancer for at least 20 years after your treatment. If Pap tests have been discontinued, your risk factors (such as having a new sexual partner) need to be reassessed to determine if screening should be resumed. Some women have medical problems that increase the chance of getting cervical cancer. In these cases, your health care provider may recommend more frequent screening and Pap tests.    Colorectal cancer can be detected and often prevented. Most routine colorectal cancer screening begins at the age of 14 years and continues through age 23 years. However, your health care provider may recommend screening at an earlier age if you have risk factors for colon cancer. On a yearly basis, your health care provider may provide home test kits to check for hidden blood in the stool. Use of a small camera at the end of a tube, to directly examine the colon  (sigmoidoscopy or colonoscopy), can detect the earliest forms of colorectal cancer. Talk to your health care provider about this at age 63, when routine screening begins. Direct exam of the colon should be repeated every 5-10 years through age 50 years, unless early forms of pre-cancerous polyps or small growths are found.  Osteoporosis is a disease in which the bones lose minerals and strength with aging. This can result in serious bone fractures or breaks. The risk of osteoporosis can be identified using a bone density scan. Women ages 80 years and over and women at risk for fractures or osteoporosis should discuss screening with their health care providers. Ask your health care provider whether you should take a calcium supplement or vitamin D to reduce the rate of osteoporosis.  Menopause can be associated with physical symptoms and risks. Hormone replacement therapy is available to decrease symptoms and risks. You should talk to your health care provider about whether hormone replacement therapy is right for you.  Use sunscreen. Apply sunscreen liberally and repeatedly throughout the day. You should seek shade when your shadow is shorter than you. Protect yourself by wearing long sleeves, pants, a wide-brimmed hat, and sunglasses year round, whenever you are outdoors.  Once a month, do a whole body skin exam, using a mirror to look at the skin on your back. Tell your health care provider of new moles, moles that have irregular borders, moles that are larger than a pencil eraser, or moles that have changed in shape or color.  Stay current with required vaccines (immunizations).  Influenza vaccine. All adults should be immunized every year.  Tetanus, diphtheria, and acellular pertussis (Td, Tdap) vaccine. Pregnant women should receive 1 dose of Tdap vaccine during each pregnancy. The dose should be obtained regardless of the length of time since the last dose. Immunization is preferred during the  27th-36th week of gestation. An adult who has not previously received Tdap or who does not know her vaccine status should receive 1 dose of Tdap. This initial dose should be followed by tetanus and diphtheria toxoids (Td) booster doses every 10 years. Adults with an unknown or incomplete history of completing a 3-dose immunization series with Td-containing vaccines should begin or complete a primary immunization series including a Tdap dose. Adults should receive a Td booster every 10 years.    Zoster vaccine. One dose is recommended for adults aged 62 years or older unless certain conditions are present.    Pneumococcal 13-valent conjugate (PCV13) vaccine. When indicated, a person who is uncertain of her immunization history and has no record of immunization should receive  the PCV13 vaccine. An adult aged 39 years or older who has certain medical conditions and has not been previously immunized should receive 1 dose of PCV13 vaccine. This PCV13 should be followed with a dose of pneumococcal polysaccharide (PPSV23) vaccine. The PPSV23 vaccine dose should be obtained at least 8 weeks after the dose of PCV13 vaccine. An adult aged 22 years or older who has certain medical conditions and previously received 1 or more doses of PPSV23 vaccine should receive 1 dose of PCV13. The PCV13 vaccine dose should be obtained 1 or more years after the last PPSV23 vaccine dose.    Pneumococcal polysaccharide (PPSV23) vaccine. When PCV13 is also indicated, PCV13 should be obtained first. All adults aged 59 years and older should be immunized. An adult younger than age 21 years who has certain medical conditions should be immunized. Any person who resides in a nursing home or long-term care facility should be immunized. An adult smoker should be immunized. People with an immunocompromised condition and certain other conditions should receive both PCV13 and PPSV23 vaccines. People with human immunodeficiency virus (HIV)  infection should be immunized as soon as possible after diagnosis. Immunization during chemotherapy or radiation therapy should be avoided. Routine use of PPSV23 vaccine is not recommended for American Indians, [place] Natives, or people younger than 65 years unless there are medical conditions that require PPSV23 vaccine. When indicated, people who have unknown immunization and have no record of immunization should receive PPSV23 vaccine. One-time revaccination 5 years after the first dose of PPSV23 is recommended for people aged 19-64 years who have chronic kidney failure, nephrotic syndrome, asplenia, or immunocompromised conditions. People who received 1-2 doses of PPSV23 before age 30 years should receive another dose of PPSV23 vaccine at age 85 years or later if at least 5 years have passed since the previous dose. Doses of PPSV23 are not needed for people immunized with PPSV23 at or after age 20 years.   Preventive Services / Frequency  Ages 68 years and over  Blood pressure check.  Lipid and cholesterol check.  Lung cancer screening. / Every year if you are aged 46-80 years and have a 30-pack-year history of smoking and currently smoke or have quit within the past 15 years. Yearly screening is stopped once you have quit smoking for at least 15 years or develop a health problem that would prevent you from having lung cancer treatment.  Clinical breast exam.** / Every year after age 75 years.  BRCA-related cancer risk assessment.** / For women who have family members with a BRCA-related cancer (breast, ovarian, tubal, or peritoneal cancers).  Mammogram.** / Every year beginning at age 27 years and continuing for as long as you are in good health. Consult with your health care provider.  Pap test.** / Every 3 years starting at age 27 years through age 50 or 38 years with 3 consecutive normal Pap tests. Testing can be stopped between 65 and 70 years with 3 consecutive normal Pap tests and no  abnormal Pap or HPV tests in the past 10 years.  Fecal occult blood test (FOBT) of stool. / Every year beginning at age 74 years and continuing until age 43 years. You may not need to do this test if you get a colonoscopy every 10 years.  Flexible sigmoidoscopy or colonoscopy.** / Every 5 years for a flexible sigmoidoscopy or every 10 years for a colonoscopy beginning at age 51 years and continuing until age 91 years.  Hepatitis C blood test.** /  For all people born from [birth year] through 1963 and any individual with known risks for hepatitis C.  Osteoporosis screening.** / A one-time screening for women ages 51 years and over and women at risk for fractures or osteoporosis.  Skin self-exam. / Monthly.  Influenza vaccine. / Every year.  Tetanus, diphtheria, and acellular pertussis (Tdap/Td) vaccine.** / 1 dose of Td every 10 years.  Zoster vaccine.** / 1 dose for adults aged 36 years or older.  Pneumococcal 13-valent conjugate (PCV13) vaccine.** / Consult your health care provider.  Pneumococcal polysaccharide (PPSV23) vaccine.** / 1 dose for all adults aged 25 years and older. Screening for abdominal aortic aneurysm (AAA)  by ultrasound is recommended for people who have history of high blood pressure or who are current or former smokers.

## 2015-03-17 LAB — LIPID PANEL
Cholesterol: 160 mg/dL (ref 0–200)
HDL: 58 mg/dL (ref >=46)
LDL Cholesterol: 73 mg/dL (ref 0–99)
Total CHOL/HDL Ratio: 2.8 Ratio
Triglycerides: 145 mg/dL (ref <150)
VLDL: 29 mg/dL (ref 0–40)

## 2015-03-17 LAB — HEPATIC FUNCTION PANEL
ALBUMIN: 4.2 g/dL (ref 3.5–5.2)
ALT: 24 U/L (ref 0–35)
AST: 28 U/L (ref 0–37)
Alkaline Phosphatase: 88 U/L (ref 39–117)
BILIRUBIN DIRECT: 0.1 mg/dL (ref 0.0–0.3)
Indirect Bilirubin: 0.5 mg/dL (ref 0.2–1.2)
TOTAL PROTEIN: 6.6 g/dL (ref 6.0–8.3)
Total Bilirubin: 0.6 mg/dL (ref 0.2–1.2)

## 2015-03-17 LAB — BASIC METABOLIC PANEL WITH GFR
BUN: 27 mg/dL — AB (ref 6–23)
CO2: 26 mEq/L (ref 19–32)
Calcium: 9.8 mg/dL (ref 8.4–10.5)
Chloride: 102 mEq/L (ref 96–112)
Creat: 1.26 mg/dL — ABNORMAL HIGH (ref 0.50–1.10)
GFR, EST AFRICAN AMERICAN: 48 mL/min — AB
GFR, EST NON AFRICAN AMERICAN: 41 mL/min — AB
GLUCOSE: 96 mg/dL (ref 70–99)
Potassium: 4.6 mEq/L (ref 3.5–5.3)
Sodium: 141 mEq/L (ref 135–145)

## 2015-03-17 LAB — VITAMIN D 25 HYDROXY (VIT D DEFICIENCY, FRACTURES): VIT D 25 HYDROXY: 72 ng/mL (ref 30–100)

## 2015-03-17 LAB — INSULIN, FASTING: Insulin fasting, serum: 13.3 u[IU]/mL (ref 2.0–19.6)

## 2015-03-17 LAB — MAGNESIUM: Magnesium: 1.9 mg/dL (ref 1.5–2.5)

## 2015-03-17 LAB — TSH: TSH: 3.424 u[IU]/mL (ref 0.350–4.500)

## 2015-03-27 ENCOUNTER — Other Ambulatory Visit: Payer: Self-pay | Admitting: Internal Medicine

## 2015-06-08 ENCOUNTER — Other Ambulatory Visit: Payer: Self-pay | Admitting: Internal Medicine

## 2015-06-10 ENCOUNTER — Other Ambulatory Visit: Payer: Self-pay | Admitting: Physician Assistant

## 2015-06-19 ENCOUNTER — Ambulatory Visit (INDEPENDENT_AMBULATORY_CARE_PROVIDER_SITE_OTHER): Payer: PPO | Admitting: Internal Medicine

## 2015-06-19 ENCOUNTER — Encounter: Payer: Self-pay | Admitting: Internal Medicine

## 2015-06-19 VITALS — BP 110/64 | HR 84 | Temp 97.3°F | Resp 16 | Ht 63.5 in | Wt 144.0 lb

## 2015-06-19 DIAGNOSIS — I10 Essential (primary) hypertension: Secondary | ICD-10-CM

## 2015-06-19 DIAGNOSIS — M1000 Idiopathic gout, unspecified site: Secondary | ICD-10-CM

## 2015-06-19 DIAGNOSIS — Z79899 Other long term (current) drug therapy: Secondary | ICD-10-CM

## 2015-06-19 DIAGNOSIS — E1129 Type 2 diabetes mellitus with other diabetic kidney complication: Secondary | ICD-10-CM

## 2015-06-19 DIAGNOSIS — E559 Vitamin D deficiency, unspecified: Secondary | ICD-10-CM

## 2015-06-19 DIAGNOSIS — E1140 Type 2 diabetes mellitus with diabetic neuropathy, unspecified: Secondary | ICD-10-CM

## 2015-06-19 DIAGNOSIS — Z6826 Body mass index (BMI) 26.0-26.9, adult: Secondary | ICD-10-CM

## 2015-06-19 DIAGNOSIS — Z0000 Encounter for general adult medical examination without abnormal findings: Secondary | ICD-10-CM

## 2015-06-19 DIAGNOSIS — E782 Mixed hyperlipidemia: Secondary | ICD-10-CM

## 2015-06-19 LAB — CBC WITH DIFFERENTIAL/PLATELET
Basophils Absolute: 0.1 10*3/uL (ref 0.0–0.1)
Basophils Relative: 1 % (ref 0–1)
Eosinophils Absolute: 0.2 10*3/uL (ref 0.0–0.7)
Eosinophils Relative: 4 % (ref 0–5)
HEMATOCRIT: 38.6 % (ref 36.0–46.0)
Hemoglobin: 13 g/dL (ref 12.0–15.0)
LYMPHS ABS: 2.6 10*3/uL (ref 0.7–4.0)
Lymphocytes Relative: 43 % (ref 12–46)
MCH: 29.7 pg (ref 26.0–34.0)
MCHC: 33.7 g/dL (ref 30.0–36.0)
MCV: 88.3 fL (ref 78.0–100.0)
MPV: 10.1 fL (ref 8.6–12.4)
Monocytes Absolute: 0.4 10*3/uL (ref 0.1–1.0)
Monocytes Relative: 7 % (ref 3–12)
NEUTROS ABS: 2.7 10*3/uL (ref 1.7–7.7)
Neutrophils Relative %: 45 % (ref 43–77)
Platelets: 197 10*3/uL (ref 150–400)
RBC: 4.37 MIL/uL (ref 3.87–5.11)
RDW: 14.6 % (ref 11.5–15.5)
WBC: 6.1 10*3/uL (ref 4.0–10.5)

## 2015-06-19 LAB — LIPID PANEL
CHOL/HDL RATIO: 3.3 ratio
Cholesterol: 166 mg/dL (ref 0–200)
HDL: 50 mg/dL (ref >=46)
LDL CALC: 90 mg/dL (ref 0–99)
Triglycerides: 129 mg/dL (ref <150)
VLDL: 26 mg/dL (ref 0–40)

## 2015-06-19 LAB — HEPATIC FUNCTION PANEL
ALK PHOS: 94 U/L (ref 39–117)
ALT: 33 U/L (ref 0–35)
AST: 28 U/L (ref 0–37)
Albumin: 4.2 g/dL (ref 3.5–5.2)
BILIRUBIN DIRECT: 0.1 mg/dL (ref 0.0–0.3)
BILIRUBIN TOTAL: 0.7 mg/dL (ref 0.2–1.2)
Indirect Bilirubin: 0.6 mg/dL (ref 0.2–1.2)
Total Protein: 7.1 g/dL (ref 6.0–8.3)

## 2015-06-19 LAB — BASIC METABOLIC PANEL WITH GFR
BUN: 32 mg/dL — AB (ref 6–23)
CHLORIDE: 103 meq/L (ref 96–112)
CO2: 28 mEq/L (ref 19–32)
CREATININE: 1.49 mg/dL — AB (ref 0.50–1.10)
Calcium: 10.2 mg/dL (ref 8.4–10.5)
GFR, EST NON AFRICAN AMERICAN: 34 mL/min — AB
GFR, Est African American: 39 mL/min — ABNORMAL LOW
Glucose, Bld: 109 mg/dL — ABNORMAL HIGH (ref 70–99)
POTASSIUM: 4.3 meq/L (ref 3.5–5.3)
SODIUM: 142 meq/L (ref 135–145)

## 2015-06-19 LAB — HEMOGLOBIN A1C
HEMOGLOBIN A1C: 6.4 % — AB (ref <5.7)
Mean Plasma Glucose: 137 mg/dL — ABNORMAL HIGH (ref <117)

## 2015-06-19 LAB — MAGNESIUM: Magnesium: 1.9 mg/dL (ref 1.5–2.5)

## 2015-06-19 LAB — URIC ACID: URIC ACID, SERUM: 3.7 mg/dL (ref 2.4–7.0)

## 2015-06-19 LAB — TSH: TSH: 1.867 u[IU]/mL (ref 0.350–4.500)

## 2015-06-19 NOTE — Patient Instructions (Signed)
Recommend Adult Low dose Aspirin or coated  Aspirin 81 mg daily   To reduce risk of Colon Cancer 20 %,   Skin Cancer 26 % ,   Melanoma 46%   and   Pancreatic cancer 60% ++++++++++++++++++ Vitamin D goal is between 70-100.   Please make sure that you are taking your Vitamin D as directed.   It is very important as a natural anti-inflammatory   helping hair, skin, and nails, as well as reducing stroke and heart attack risk.   It helps your bones and helps with mood.  It also decreases numerous cancer risks so please take it as directed.   Low Vit D is associated with a 200-300% higher risk for CANCER   and 200-300% higher risk for HEART   ATTACK  &  STROKE.   .....................................Marland Kitchen  It is also associated with higher death rate at younger ages,   autoimmune diseases like Rheumatoid arthritis, Lupus, Multiple Sclerosis.     Also many other serious conditions, like depression, Alzheimer's  Dementia, infertility, muscle aches, fatigue, fibromyalgia - just to name a few.  +++++++++++++++++++  Recommend the book "The END of DIETING" by Dr Excell Seltzer   & the book "The END of DIABETES " by Dr Excell Seltzer  At [email or web address] - get book & Audio CD's     Being diabetic has a  300% increased risk for heart attack, stroke, cancer, and alzheimer- type vascular dementia. It is very important that you work harder with diet by avoiding all foods that are white. Avoid white rice (brown & wild rice is OK), white potatoes (sweetpotatoes in moderation is OK), White bread or wheat bread or anything made out of white flour like bagels, donuts, rolls, buns, biscuits, cakes, pastries, cookies, pizza crust, and pasta (made from white flour & egg whites) - vegetarian pasta or spinach or wheat pasta is OK. Multigrain breads like Arnold's or [employer], or multigrain sandwich thins or flatbreads.  Diet, exercise and weight loss can reverse and cure diabetes in the early stages.   Diet, exercise and weight loss is very important in the control and prevention of complications of diabetes which affects every system in your body, ie. Brain - dementia/stroke, eyes - glaucoma/blindness, heart - heart attack/heart failure, kidneys - dialysis, stomach - gastric paralysis, intestines - malabsorption, nerves - severe painful neuritis, circulation - gangrene & loss of a leg(s), and finally cancer and Alzheimers.    I recommend avoid fried & greasy foods,  sweets/candy, white rice (brown or wild rice or Quinoa is OK), white potatoes (sweet potatoes are OK) - anything made from white flour - bagels, doughnuts, rolls, buns, biscuits,white and wheat breads, pizza crust and traditional pasta made of white flour & egg white(vegetarian pasta or spinach or wheat pasta is OK).  Multi-grain bread is OK - like multi-grain flat bread or sandwich thins. Avoid alcohol in excess. Exercise is also important.    Eat all the vegetables you want - avoid meat, especially red meat and dairy - especially cheese.  Cheese is the most concentrated form of trans-fats which is the worst thing to clog up our arteries. Veggie cheese is OK which can be found in the fresh produce section at [facility] or [employer] or [employer]  ++++++++++++++++++++++++++

## 2015-06-19 NOTE — Progress Notes (Signed)
Patient ID: Kayla Baker, female   DOB: Mar 02, 1936, 79 y.o.   MRN: [id number]    This very nice 79 y.o. MWF presents for 3 month follow up with Hypertension, Hyperlipidemia, T2_NIDDM / CKD3   and Vitamin D Deficiency.     Patient is treated for HTN since 2000 & BP has been controlled at home. Today's BP: 110/64 mmHg. Patient has had no complaints of any cardiac type chest pain, palpitations, dyspnea/orthopnea/PND, dizziness, claudication, or dependent edema.    Hyperlipidemia is controlled with diet & meds. Patient denies myalgias or other med SE's. Last Lipids were at goal - Cholesterol 160; HDL 58; LDL Cholesterol 73; Triglycerides 145 on 03/16/2015.    Also, the patient has history of T2_NIDDM since Dec 2011 w/CKD 3 (GFR 454 ml/min) and she is managing this with diet. She has had no symptoms of reactive hypoglycemia, diabetic polys, paresthesias or visual blurring.  Last A1c was  6.4% on 03/16/2015.    Further, the patient also has history of Vitamin D Deficiency of 33 in 2008 and supplements vitamin D without any suspected side-effects. Last vitamin D was  72 on 03/16/2015.  Medication Sig  . allopurinol  300 MG tablet TAKE 1 TABLET BY MOUTH EVERY DAY  . amitriptyline  10 MG tablet TAKE 2 TABLETS BY MOUTH AT BEDTIME  . aspirin 81 MG tablet Take 81 mg by mouth daily.  Marland Kitchen atorvastatin  80 MG tablet Take 80 mg by mouth daily.  Marland Kitchen VITAMIN D 1000 UNITS tablet Take 1,000 Units by mouth daily.  . enalapril  20 MG tablet TAKE 1 TABLET BY MOUTH DAILY  . estradiol1 MG tablet TAKE 1 TABLET BY MOUTH DAILY  . hctz 25 MG tablet TAKE 1 TABLET BY MOUTH DAILY  . Magnesium 250 MG TABS Take 250 mg by mouth 2 (two) times daily. Takes 2 tabs in am and 2 tabs in pm  . Multiple Vitamin  Take 1 tablet by mouth daily.  Marland Kitchen FISH OIL 1200 MG CAPS Take by mouth daily. Takes 2 capsules daily  . omeprazole  40 MG  TAKE ONE CAPSULE BY MOUTH ONCE A DAY  . pregabalin (LYRICA) 50 MG capsule Take 1 capsule (50 mg total) by mouth 3  (three) times daily.  . vitamin B-12  1000 MCG  Take 1,000 mcg by mouth daily. Takes 1 tablet every other day  . vitamin C 500 MG tablet Take 500 mg by mouth daily. Takes 2 tablets daily   Allergies  Allergen Reactions  . Keflex [Cephalexin] Swelling    Tongue and throat swells  . Meloxicam   . Prednisone     High doses  . Premarin [Estrogens Conjugated]   . Trovan [Alatrofloxacin]    PMHx:   Past Medical History  Diagnosis Date  . Hypertension   . Hyperlipidemia   . GERD (gastroesophageal reflux disease)   . Gout   . Allergy   . Diet-controlled type 2 diabetes mellitus   . Vitamin D deficiency   . Anemia    Immunization History  Administered Date(s) Administered  . DTaP 07/01/2004  . Influenza Whole 08/15/2013  . Influenza, High Dose Seasonal PF 08/28/2014  . Pneumococcal Polysaccharide-23 11/01/2009  . Td 08/10/2009  . Zoster 08/02/2011   Past Surgical History  Procedure Laterality Date  . Cataract extraction, bilateral  2014    right eye 2/14; left eye 3/3  . Abdominal hysterectomy  1973  . Incontinence surgery  2006  . Knee arthroscopy  [id number] right  . Hand surgery  2012    left   FHx:    Reviewed / unchanged  SHx:    Reviewed / unchanged  Systems Review:  Constitutional: Denies fever, chills, wt changes, headaches, insomnia, fatigue, night sweats, change in appetite. Eyes: Denies redness, blurred vision, diplopia, discharge, itchy, watery eyes.  ENT: Denies discharge, congestion, post nasal drip, epistaxis, sore throat, earache, hearing loss, dental pain, tinnitus, vertigo, sinus pain, snoring.  CV: Denies chest pain, palpitations, irregular heartbeat, syncope, dyspnea, diaphoresis, orthopnea, PND, claudication or edema. Respiratory: denies cough, dyspnea, DOE, pleurisy, hoarseness, laryngitis, wheezing.  Gastrointestinal: Denies dysphagia, odynophagia, heartburn, reflux, water brash, abdominal pain or cramps, nausea, vomiting, bloating, diarrhea,  constipation, hematemesis, melena, hematochezia  or hemorrhoids. Genitourinary: Denies dysuria, frequency, urgency, nocturia, hesitancy, discharge, hematuria or flank pain. Musculoskeletal: Denies arthralgias, myalgias, stiffness, jt. swelling, pain, limping or strain/sprain.  Skin: Denies pruritus, rash, hives, warts, acne, eczema or change in skin lesion(s). Neuro: No weakness, tremor, incoordination, spasms, paresthesia or pain. Psychiatric: Denies confusion, memory loss or sensory loss. Endo: Denies change in weight, skin or hair change.  Heme/Lymph: No excessive bleeding, bruising or enlarged lymph nodes.  Physical Exam  BP 110/64 mmHg  Pulse 84  Temp(Src) 97.3 F (36.3 C)  Resp 16  Ht 5' 3.5" (1.613 m)  Wt 144 lb (65.318 kg)  BMI 25.11 kg/m2  Appears well nourished and in no distress. Eyes: PERRLA, EOMs, conjunctiva no swelling or erythema. Sinuses: No frontal/maxillary tenderness ENT/Mouth: EAC's clear, TM's nl w/o erythema, bulging. Nares clear w/o erythema, swelling, exudates. Oropharynx clear without erythema or exudates. Oral hygiene is good. Tongue normal, non obstructing. Hearing intact.  Neck: Supple. Thyroid nl. Car 2+/2+ without bruits, nodes or JVD. Chest: Respirations nl with BS clear & equal w/o rales, rhonchi, wheezing or stridor.  Cor: Heart sounds normal w/ regular rate and rhythm without sig. murmurs, gallops, clicks, or rubs. Peripheral pulses normal and equal  without edema.  Abdomen: Soft & bowel sounds normal. Non-tender w/o guarding, rebound, hernias, masses, or organomegaly.  Lymphatics: Unremarkable.  Musculoskeletal: Full ROM all peripheral extremities, joint stability, 5/5 strength, and normal gait.  Skin: Warm, dry without exposed rashes, lesions or ecchymosis apparent.  Neuro: Cranial nerves intact, reflexes equal bilaterally. Sensory-motor testing grossly intact. Tendon reflexes grossly intact.  Pysch: Alert & oriented x 3.  Insight and judgement nl  & appropriate. No ideations.  Assessment and Plan:  1. Essential hypertension  - TSH  2. Hyperlipidemia  - Lipid panel  3. Type 2 diabetes mellitus with other diabetic kidney complication  - Hemoglobin A1c - Insulin, random  4. Vitamin D deficiency  - Vit D  25 hydroxy (rtn osteoporosis monitoring)  5. Idiopathic gout, unspecified chronicity, unspecified site  - Uric acid  6. Diabetic neuropathy, painful   7. Medication management  - CBC with Differential/Platelet - BASIC METABOLIC PANEL WITH GFR - Hepatic function panel - Magnesium  8. BMI 26.0-26.9,adult   Recommended regular exercise, BP monitoring, weight control, and discussed med and SE's. Recommended labs to assess and monitor clinical status. Further disposition pending results of labs. Over 30 minutes of exam, counseling, chart review was performed

## 2015-06-20 LAB — INSULIN, RANDOM: INSULIN: 13.3 u[IU]/mL (ref 2.0–19.6)

## 2015-06-20 LAB — VITAMIN D 25 HYDROXY (VIT D DEFICIENCY, FRACTURES): Vit D, 25-Hydroxy: 60 ng/mL (ref 30–100)

## 2015-08-11 ENCOUNTER — Other Ambulatory Visit: Payer: Self-pay | Admitting: Internal Medicine

## 2015-09-20 ENCOUNTER — Other Ambulatory Visit: Payer: Self-pay | Admitting: Physician Assistant

## 2015-09-20 ENCOUNTER — Ambulatory Visit (INDEPENDENT_AMBULATORY_CARE_PROVIDER_SITE_OTHER): Payer: PPO | Admitting: Physician Assistant

## 2015-09-20 ENCOUNTER — Encounter: Payer: Self-pay | Admitting: Physician Assistant

## 2015-09-20 VITALS — BP 120/70 | HR 88 | Temp 97.3°F | Resp 14 | Ht 63.5 in | Wt 148.0 lb

## 2015-09-20 DIAGNOSIS — Z79899 Other long term (current) drug therapy: Secondary | ICD-10-CM

## 2015-09-20 DIAGNOSIS — Z1331 Encounter for screening for depression: Secondary | ICD-10-CM

## 2015-09-20 DIAGNOSIS — E559 Vitamin D deficiency, unspecified: Secondary | ICD-10-CM

## 2015-09-20 DIAGNOSIS — I10 Essential (primary) hypertension: Secondary | ICD-10-CM

## 2015-09-20 DIAGNOSIS — Z1389 Encounter for screening for other disorder: Secondary | ICD-10-CM

## 2015-09-20 DIAGNOSIS — E1129 Type 2 diabetes mellitus with other diabetic kidney complication: Secondary | ICD-10-CM

## 2015-09-20 DIAGNOSIS — Z23 Encounter for immunization: Secondary | ICD-10-CM

## 2015-09-20 DIAGNOSIS — M1000 Idiopathic gout, unspecified site: Secondary | ICD-10-CM

## 2015-09-20 DIAGNOSIS — E782 Mixed hyperlipidemia: Secondary | ICD-10-CM

## 2015-09-20 DIAGNOSIS — E1140 Type 2 diabetes mellitus with diabetic neuropathy, unspecified: Secondary | ICD-10-CM

## 2015-09-20 LAB — TSH: TSH: 2.154 u[IU]/mL (ref 0.350–4.500)

## 2015-09-20 LAB — HEPATIC FUNCTION PANEL
ALBUMIN: 4.3 g/dL (ref 3.6–5.1)
ALK PHOS: 92 U/L (ref 33–130)
ALT: 23 U/L (ref 6–29)
AST: 21 U/L (ref 10–35)
BILIRUBIN DIRECT: 0.1 mg/dL (ref <=0.2)
Indirect Bilirubin: 0.4 mg/dL (ref 0.2–1.2)
Total Bilirubin: 0.5 mg/dL (ref 0.2–1.2)
Total Protein: 6.8 g/dL (ref 6.1–8.1)

## 2015-09-20 LAB — CBC WITH DIFFERENTIAL/PLATELET
Basophils Absolute: 0.1 10*3/uL (ref 0.0–0.1)
Basophils Relative: 1 % (ref 0–1)
Eosinophils Absolute: 0.2 10*3/uL (ref 0.0–0.7)
Eosinophils Relative: 4 % (ref 0–5)
HCT: 38.6 % (ref 36.0–46.0)
HEMOGLOBIN: 12.6 g/dL (ref 12.0–15.0)
LYMPHS ABS: 2.6 10*3/uL (ref 0.7–4.0)
LYMPHS PCT: 44 % (ref 12–46)
MCH: 29.1 pg (ref 26.0–34.0)
MCHC: 32.6 g/dL (ref 30.0–36.0)
MCV: 89.1 fL (ref 78.0–100.0)
MONOS PCT: 7 % (ref 3–12)
MPV: 9.9 fL (ref 8.6–12.4)
Monocytes Absolute: 0.4 10*3/uL (ref 0.1–1.0)
NEUTROS ABS: 2.6 10*3/uL (ref 1.7–7.7)
NEUTROS PCT: 44 % (ref 43–77)
Platelets: 224 10*3/uL (ref 150–400)
RBC: 4.33 MIL/uL (ref 3.87–5.11)
RDW: 14.1 % (ref 11.5–15.5)
WBC: 5.8 10*3/uL (ref 4.0–10.5)

## 2015-09-20 LAB — LIPID PANEL
CHOL/HDL RATIO: 3.1 ratio (ref <=5.0)
Cholesterol: 157 mg/dL (ref 125–200)
HDL: 50 mg/dL (ref >=46)
LDL CALC: 73 mg/dL (ref <130)
Triglycerides: 168 mg/dL — ABNORMAL HIGH (ref <150)
VLDL: 34 mg/dL — ABNORMAL HIGH (ref <30)

## 2015-09-20 LAB — BASIC METABOLIC PANEL WITH GFR
BUN: 26 mg/dL — AB (ref 7–25)
CHLORIDE: 103 mmol/L (ref 98–110)
CO2: 28 mmol/L (ref 20–31)
CREATININE: 1.29 mg/dL — AB (ref 0.60–0.93)
Calcium: 10 mg/dL (ref 8.6–10.4)
GFR, Est African American: 46 mL/min — ABNORMAL LOW (ref >=60)
GFR, Est Non African American: 40 mL/min — ABNORMAL LOW (ref >=60)
Glucose, Bld: 93 mg/dL (ref 65–99)
POTASSIUM: 4.3 mmol/L (ref 3.5–5.3)
Sodium: 141 mmol/L (ref 135–146)

## 2015-09-20 LAB — HEMOGLOBIN A1C
HEMOGLOBIN A1C: 6.5 % — AB (ref <5.7)
MEAN PLASMA GLUCOSE: 140 mg/dL — AB (ref <117)

## 2015-09-20 LAB — MAGNESIUM: Magnesium: 1.8 mg/dL (ref 1.5–2.5)

## 2015-09-20 MED ORDER — OMEPRAZOLE 40 MG PO CPDR: 40.0000 mg | DELAYED_RELEASE_CAPSULE | Freq: Every day | ORAL | Status: DC

## 2015-09-20 MED ORDER — RANITIDINE HCL 300 MG PO TABS: ORAL_TABLET | ORAL | Status: DC

## 2015-09-20 NOTE — Progress Notes (Signed)
Assessment and Plan:  1. Hypertension -Continue medication, monitor blood pressure at home. Continue DASH diet.  Reminder to go to the ER if any CP, SOB, nausea, dizziness, severe HA, changes vision/speech, left arm numbness and tingling and jaw pain.  2. Cholesterol -Continue diet and exercise. Check cholesterol.   3. Diabetes with diabetic chronic kidney disease and with diabetic polyneuropathy -Continue diet and exercise. Check A1C - cut HCTZ in half, and try to get off PPI - continue amitriptyline and can take lyrica PRN.   4. Vitamin D Def - check level and continue medications.   5. GERD - will try to get off PPiI given info for taper and zantac sent in  6. Depression screen negative   Continue diet and meds as discussed. Further disposition pending results of labs. Discussed med's effects and SE's.    Over 30 minutes of exam, counseling, chart review, and critical decision making was performed   HPI 79 y.o. female  presents for 3 month follow up on hypertension, cholesterol, diabetes and vitamin D deficiency.   Her blood pressure has been controlled at home, today her BP is BP: 120/70 mmHg.  She does workout. She denies chest pain, shortness of breath, dizziness.  She is on cholesterol medication and denies myalgias. Her cholesterol is at goal. The cholesterol was:   Lab Results  Component Value Date   CHOL 166 06/19/2015   HDL 50 06/19/2015   LDLCALC 90 06/19/2015   TRIG 129 06/19/2015   CHOLHDL 3.3 06/19/2015    She has been working on diet and exercise for diabetes with diabetic chronic kidney disease and with diabetic polyneuropathy, she is on bASA, she is on ACE/ARB, she is on amitriptyline 2 in the AM and is off lyrica at this time, she is doing well with her neuropathy, will only take lyrica if she needs it, and denies  polydipsia, polyuria and visual disturbances. Last A1C was:  Lab Results  Component Value Date   HGBA1C 6.4* 06/19/2015  Last GFR due to CKD.  She is on HCTZ and prilosec daily.  Lab Results  Component Value Date   GFRNONAA 34* 06/19/2015   Patient is on Vitamin D supplement. Lab Results  Component Value Date   VD25OH 60 06/19/2015     Current Medications:  Current Outpatient Prescriptions on File Prior to Visit  Medication Sig Dispense Refill  . allopurinol (ZYLOPRIM) 300 MG tablet TAKE 1 TABLET BY MOUTH EVERY DAY 90 tablet PRN  . amitriptyline (ELAVIL) 10 MG tablet TAKE 2 TABLETS BY MOUTH AT BEDTIME 60 tablet 99  . aspirin 81 MG tablet Take 81 mg by mouth daily.    Marland Kitchen atorvastatin (LIPITOR) 80 MG tablet Take 80 mg by mouth daily.    . cholecalciferol (VITAMIN D) 1000 UNITS tablet Take 1,000 Units by mouth daily.    Marland Kitchen CINNAMON PO Take 1,000 mg by mouth 2 (two) times daily.    . enalapril (VASOTEC) 20 MG tablet TAKE 1 TABLET BY MOUTH DAILY 90 tablet 1  . estradiol (ESTRACE) 1 MG tablet TAKE 1 TABLET BY MOUTH DAILY 90 tablet 1  . FREESTYLE LITE test strip USE ONE STRIP TO CHECK GLUCOSE ONCE DAILY. 100 each 0  . hydrochlorothiazide (HYDRODIURIL) 25 MG tablet TAKE 1 TABLET BY MOUTH DAILY 90 tablet 3  . Magnesium 250 MG TABS Take 250 mg by mouth 2 (two) times daily. Takes 2 tabs in am and 2 tabs in pm    . Multiple Vitamin (MULTIVITAMIN) tablet  Take 1 tablet by mouth daily.    . Omega-3 Fatty Acids (FISH OIL) 1200 MG CAPS Take by mouth daily. Takes 2 capsules daily    . omeprazole (PRILOSEC) 40 MG capsule TAKE 1 CAPSULE BY MOUTH ONCE DAILY 30 capsule 0  . vitamin B-12 (CYANOCOBALAMIN) 1000 MCG tablet Take 1,000 mcg by mouth daily. Takes 1 tablet every other day    . vitamin C (ASCORBIC ACID) 500 MG tablet Take 500 mg by mouth daily. Takes 2 tablets daily    . pregabalin (LYRICA) 50 MG capsule Take 1 capsule (50 mg total) by mouth 3 (three) times daily. (Patient not taking: Reported on 06/19/2015) 90 capsule 5   No current facility-administered medications on file prior to visit.   Medical History:  Past Medical History   Diagnosis Date  . Hypertension   . Hyperlipidemia   . GERD (gastroesophageal reflux disease)   . Gout   . Allergy   . Diet-controlled type 2 diabetes mellitus ([place])   . Vitamin D deficiency   . Anemia    Allergies:  Allergies  Allergen Reactions  . Keflex [Cephalexin] Swelling    Tongue and throat swells  . Meloxicam   . Prednisone     High doses  . Premarin [Estrogens Conjugated]   . Trovan [Alatrofloxacin]      Review of Systems:  Review of Systems  Constitutional: Negative.   HENT: Negative.   Eyes: Negative.   Respiratory: Negative.   Cardiovascular: Negative.   Gastrointestinal: Negative.   Genitourinary: Negative.   Musculoskeletal: Negative.   Skin: Negative.   Neurological: Negative.   Endo/Heme/Allergies: Negative.   Psychiatric/Behavioral: Negative.     Family history- Review and unchanged Social history- Review and unchanged Physical Exam: BP 120/70 mmHg  Pulse 88  Temp(Src) 97.3 F (36.3 C) (Temporal)  Resp 14  Ht 5' 3.5" (1.613 m)  Wt 148 lb (67.132 kg)  BMI 25.80 kg/m2  SpO2 97% Wt Readings from Last 3 Encounters:  09/20/15 148 lb (67.132 kg)  06/19/15 144 lb (65.318 kg)  03/16/15 152 lb (68.947 kg)   General Appearance: Well nourished, in no apparent distress. Eyes: PERRLA, EOMs, conjunctiva no swelling or erythema Sinuses: No Frontal/maxillary tenderness ENT/Mouth: Ext aud canals clear, TMs without erythema, bulging. No erythema, swelling, or exudate on post pharynx.  Tonsils not swollen or erythematous. Hearing normal.  Neck: Supple, thyroid normal.  Respiratory: Respiratory effort normal, BS equal bilaterally without rales, rhonchi, wheezing or stridor.  Cardio: RRR with no MRGs. Brisk peripheral pulses without edema.  Abdomen: Soft, + BS.  Non tender, no guarding, rebound, hernias, masses. Lymphatics: Non tender without lymphadenopathy.  Musculoskeletal: Full ROM, 5/5 strength, Normal gait Skin: Warm, dry without rashes, lesions,  ecchymosis.  Neuro: Cranial nerves intact. No cerebellar symptoms.  Psych: Awake and oriented X 3, normal affect, Insight and Judgment appropriate.    Vicie Mutters, PA-C 10:34 AM [facility]

## 2015-09-20 NOTE — Patient Instructions (Addendum)
Cut back on the HCTZ to 1/2 pill daily for your kidney function, if you get swelling then you can increase back to 1 pill.   Get over the counter hydrocortisone cream and put it on your ear morning and night x 1-2 weeks.   GETTING OFF OF PPI's    Nexium/protonix/prilosec/Omeprazole/Dexilant/Aciphex are called PPI's, they are great at healing your stomach but should only be taken for a short period of time.     Recent studies have shown that taken for a long time they  can increase the risk of osteoporosis (weakening of your bones), pneumonia, low magnesium, restless legs, Cdiff (infection that causes diarrhea), DEMENTIA and most recently kidney damage / disease / insufficiency.     Due to this information we want to try to stop the PPI but if you try to stop it abruptly this can cause rebound acid and worsening symptoms.   So this is how we want you to get off the PPI:  - Start taking the nexium/protonix/prilosec/PPI  every other day with  zantac (ranitidine) 2 x a day for 2-4 weeks  - then decrease the PPI to every 3 days while taking the zantac (ranitidine) twice a day the other  days for 2-4  Weeks  - then you can try the zantac (ranitidine) once at night or up to 2 x day as needed.  - you can continue on this once at night or stop all together  - Avoid alcohol, spicy foods, NSAIDS (aleve, ibuprofen) at this time. See foods below.   +++++++++++++++++++++++++++++++++++++++++++  Food Choices for Gastroesophageal Reflux Disease  When you have gastroesophageal reflux disease (GERD), the foods you eat and your eating habits are very important. Choosing the right foods can help ease the discomfort of GERD. WHAT GENERAL GUIDELINES DO I NEED TO FOLLOW?  Choose fruits, vegetables, whole grains, low-fat dairy products, and low-fat meat, fish, and poultry.  Limit fats such as oils, salad dressings, butter, nuts, and avocado.  Keep a food diary to identify foods that cause  symptoms.  Avoid foods that cause reflux. These may be different for different people.  Eat frequent small meals instead of three large meals each day.  Eat your meals slowly, in a relaxed setting.  Limit fried foods.  Cook foods using methods other than frying.  Avoid drinking alcohol.  Avoid drinking large amounts of liquids with your meals.  Avoid bending over or lying down until 2-3 hours after eating.   WHAT FOODS ARE NOT RECOMMENDED? The following are some foods and drinks that may worsen your symptoms:  Vegetables Tomatoes. Tomato juice. Tomato and spaghetti sauce. Chili peppers. Onion and garlic. Horseradish. Fruits Oranges, grapefruit, and lemon (fruit and juice). Meats High-fat meats, fish, and poultry. This includes hot dogs, ribs, ham, sausage, salami, and bacon. Dairy Whole milk and chocolate milk. Sour cream. Cream. Butter. Ice cream. Cream cheese.  Beverages Coffee and tea, with or without caffeine. Carbonated beverages or energy drinks. Condiments Hot sauce. Barbecue sauce.  Sweets/Desserts Chocolate and cocoa. Donuts. Peppermint and spearmint. Fats and Oils High-fat foods, including [place] fries and potato chips. Other Vinegar. Strong spices, such as black pepper, white pepper, red pepper, cayenne, curry powder, cloves, ginger, and chili powder. Nexium/protonix/prilosec are called PPI's, they are great at healing your stomach but should only be taken for a short period of time.

## 2015-09-21 LAB — VITAMIN D 25 HYDROXY (VIT D DEFICIENCY, FRACTURES): Vit D, 25-Hydroxy: 55 ng/mL (ref 30–100)

## 2015-10-31 ENCOUNTER — Encounter: Payer: Self-pay | Admitting: Podiatry

## 2015-10-31 ENCOUNTER — Ambulatory Visit (INDEPENDENT_AMBULATORY_CARE_PROVIDER_SITE_OTHER): Payer: PPO | Admitting: Podiatry

## 2015-10-31 VITALS — BP 138/74 | HR 88 | Resp 12

## 2015-10-31 DIAGNOSIS — M79674 Pain in right toe(s): Secondary | ICD-10-CM | POA: Diagnosis not present

## 2015-10-31 DIAGNOSIS — B351 Tinea unguium: Secondary | ICD-10-CM | POA: Diagnosis not present

## 2015-10-31 NOTE — Progress Notes (Signed)
   Subjective:    Patient ID: Kayla Baker, female    DOB: 1936-04-24, 79 y.o.   MRN: [id number]  HPI This patient presents today complaining of one month history of discomfort in the right hallux toenail. The toenail is uncomfortable with shoe wearing and direct pressure. Patient has history of mycotic right hallux toenail with a history of PAS and fungal culture with [results] that did not conclusively confirm mycotic toenails infection. She was offered repeat culture, however, did not present until today   Patient is a diabetic and states that her A1c is 6.5 range 09/20/2015. She denies any history of claudication, amputation skin ulceration  Review of Systems  Skin: Positive for color change.       Objective:   Physical Exam  Orientated 3  Vascular: DP pulses 2/4 bilaterally PT pulses 2/4 bilaterally Capillary reflex immediate bilaterally  Neurological: Sensation to 10 g monofilament wire intact 5/5 bilaterally Vibratory sensation reactive bilaterally Ankle reflex equal and reactive bilaterally  Dermatological: No open skin lesions bilaterally The right hallux medial nail margin is incurvated with tenderness to palpation in a callused nail groove. 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 I debrided the medial margin right hallux toenail without a bleeding. I recommended that if this provided adequate relief the nail could be debrided as needed. If she do not receive adequatel relief from debridement return for possible matricectomy medial margin right hallux toenail

## 2015-10-31 NOTE — Patient Instructions (Addendum)
Today here diabetic foot examination revealed adequate pulse patient ability to feel. The margin of the right great toenail is mildly incurvated and the nail plate might have fungal infection as well. Today I debrided the margin of the right hallux toenail. If this provides adequate relief no further treatment is needed. If the discomfort persists return in the margin of the nail could be removed  Diabetes and Foot Care Diabetes may cause you to have problems because of poor blood supply (circulation) to your feet and legs. This may cause the skin on your feet to become thinner, break easier, and heal more slowly. Your skin may become dry, and the skin may peel and crack. You may also have nerve damage in your legs and feet causing decreased feeling in them. You may not notice minor injuries to your feet that could lead to infections or more serious problems. Taking care of your feet is one of the most important things you can do for yourself.  HOME CARE INSTRUCTIONS  Wear shoes at all times, even in the house. Do not go barefoot. Bare feet are easily injured.  Check your feet daily for blisters, cuts, and redness. If you cannot see the bottom of your feet, use a mirror or ask someone for help.  Wash your feet with warm water (do not use hot water) and mild soap. Then pat your feet and the areas between your toes until they are completely dry. Do not soak your feet as this can dry your skin.  Apply a moisturizing lotion or petroleum jelly (that does not contain alcohol and is unscented) to the skin on your feet and to dry, brittle toenails. Do not apply lotion between your toes.  Trim your toenails straight across. Do not dig under them or around the cuticle. File the edges of your nails with an emery board or nail file.  Do not cut corns or calluses or try to remove them with medicine.  Wear clean socks or stockings every day. Make sure they are not too tight. Do not wear knee-high stockings since  they may decrease blood flow to your legs.  Wear shoes that fit properly and have enough cushioning. To break in new shoes, wear them for just a few hours a day. This prevents you from injuring your feet. Always look in your shoes before you put them on to be sure there are no objects inside.  Do not cross your legs. This may decrease the blood flow to your feet.  If you find a minor scrape, cut, or break in the skin on your feet, keep it and the skin around it clean and dry. These areas may be cleansed with mild soap and water. Do not cleanse the area with peroxide, alcohol, or iodine.  When you remove an adhesive bandage, be sure not to damage the skin around it.  If you have a wound, look at it several times a day to make sure it is healing.  Do not use heating pads or hot water bottles. They may burn your skin. If you have lost feeling in your feet or legs, you may not know it is happening until it is too late.  Make sure your health care provider performs a complete foot exam at least annually or more often if you have foot problems. Report any cuts, sores, or bruises to your health care provider immediately. SEEK MEDICAL CARE IF:   You have an injury that is not healing.  You have  cuts or breaks in the skin.  You have an ingrown nail.  You notice redness on your legs or feet.  You feel burning or tingling in your legs or feet.  You have pain or cramps in your legs and feet.  Your legs or feet are numb.  Your feet always feel cold. SEEK IMMEDIATE MEDICAL CARE IF:   There is increasing redness, swelling, or pain in or around a wound.  There is a red line that goes up your leg.  Pus is coming from a wound.  You develop a fever or as directed by your health care provider.  You notice a bad smell coming from an ulcer or wound.   This information is not intended to replace advice given to you by your health care provider. Make sure you discuss any questions you have with  your health care provider.   Document Released: 11/14/2000 Document Revised: 07/20/2013 Document Reviewed: 04/26/2013 Elsevier Interactive Patient Education [employer].

## 2015-11-10 ENCOUNTER — Other Ambulatory Visit: Payer: Self-pay | Admitting: Internal Medicine

## 2015-11-20 ENCOUNTER — Other Ambulatory Visit: Payer: Self-pay | Admitting: Internal Medicine

## 2015-12-10 ENCOUNTER — Other Ambulatory Visit: Payer: Self-pay | Admitting: Internal Medicine

## 2015-12-12 ENCOUNTER — Telehealth: Payer: Self-pay | Admitting: *Deleted

## 2015-12-12 MED ORDER — ESTRADIOL 1 MG PO TABS: 1.0000 mg | ORAL_TABLET | Freq: Every day | ORAL | Status: DC

## 2015-12-12 NOTE — Telephone Encounter (Signed)
Called patient and informed her that the insurance will not cover her Estradiol, but she can get # [phone] for $10 at [facility].  The patient requested the RX be sent to [facility] on [address].

## 2015-12-21 ENCOUNTER — Encounter: Payer: Self-pay | Admitting: Internal Medicine

## 2015-12-21 ENCOUNTER — Ambulatory Visit (INDEPENDENT_AMBULATORY_CARE_PROVIDER_SITE_OTHER): Payer: PPO | Admitting: Internal Medicine

## 2015-12-21 VITALS — BP 104/62 | HR 88 | Temp 97.2°F | Resp 16 | Ht 64.0 in | Wt 150.2 lb

## 2015-12-21 DIAGNOSIS — E1122 Type 2 diabetes mellitus with diabetic chronic kidney disease: Secondary | ICD-10-CM

## 2015-12-21 DIAGNOSIS — Z0001 Encounter for general adult medical examination with abnormal findings: Secondary | ICD-10-CM | POA: Diagnosis not present

## 2015-12-21 DIAGNOSIS — E559 Vitamin D deficiency, unspecified: Secondary | ICD-10-CM | POA: Diagnosis not present

## 2015-12-21 DIAGNOSIS — I10 Essential (primary) hypertension: Secondary | ICD-10-CM | POA: Diagnosis not present

## 2015-12-21 DIAGNOSIS — Z0000 Encounter for general adult medical examination without abnormal findings: Secondary | ICD-10-CM | POA: Diagnosis not present

## 2015-12-21 DIAGNOSIS — Z1212 Encounter for screening for malignant neoplasm of rectum: Secondary | ICD-10-CM

## 2015-12-21 DIAGNOSIS — Z79899 Other long term (current) drug therapy: Secondary | ICD-10-CM | POA: Diagnosis not present

## 2015-12-21 DIAGNOSIS — Z1389 Encounter for screening for other disorder: Secondary | ICD-10-CM

## 2015-12-21 DIAGNOSIS — R6889 Other general symptoms and signs: Secondary | ICD-10-CM | POA: Diagnosis not present

## 2015-12-21 DIAGNOSIS — M1000 Idiopathic gout, unspecified site: Secondary | ICD-10-CM | POA: Diagnosis not present

## 2015-12-21 DIAGNOSIS — Z1331 Encounter for screening for depression: Secondary | ICD-10-CM

## 2015-12-21 DIAGNOSIS — K219 Gastro-esophageal reflux disease without esophagitis: Secondary | ICD-10-CM

## 2015-12-21 DIAGNOSIS — N183 Chronic kidney disease, stage 3 unspecified: Secondary | ICD-10-CM

## 2015-12-21 DIAGNOSIS — M109 Gout, unspecified: Secondary | ICD-10-CM | POA: Diagnosis not present

## 2015-12-21 DIAGNOSIS — E1140 Type 2 diabetes mellitus with diabetic neuropathy, unspecified: Secondary | ICD-10-CM | POA: Diagnosis not present

## 2015-12-21 DIAGNOSIS — E782 Mixed hyperlipidemia: Secondary | ICD-10-CM | POA: Diagnosis not present

## 2015-12-21 DIAGNOSIS — E1129 Type 2 diabetes mellitus with other diabetic kidney complication: Secondary | ICD-10-CM | POA: Diagnosis not present

## 2015-12-21 LAB — LIPID PANEL
CHOL/HDL RATIO: 2.6 ratio (ref <=5.0)
CHOLESTEROL: 176 mg/dL (ref 125–200)
HDL: 67 mg/dL (ref >=46)
LDL Cholesterol: 68 mg/dL (ref <130)
Triglycerides: 207 mg/dL — ABNORMAL HIGH (ref <150)
VLDL: 41 mg/dL — ABNORMAL HIGH (ref <30)

## 2015-12-21 LAB — BASIC METABOLIC PANEL WITH GFR
BUN: 27 mg/dL — ABNORMAL HIGH (ref 7–25)
CALCIUM: 9.7 mg/dL (ref 8.6–10.4)
CO2: 26 mmol/L (ref 20–31)
CREATININE: 1.38 mg/dL — AB (ref 0.60–0.93)
Chloride: 104 mmol/L (ref 98–110)
GFR, Est African American: 43 mL/min — ABNORMAL LOW (ref >=60)
GFR, Est Non African American: 37 mL/min — ABNORMAL LOW (ref >=60)
Glucose, Bld: 104 mg/dL — ABNORMAL HIGH (ref 65–99)
Potassium: 4.3 mmol/L (ref 3.5–5.3)
SODIUM: 141 mmol/L (ref 135–146)

## 2015-12-21 LAB — CBC WITH DIFFERENTIAL/PLATELET
BASOS PCT: 1 % (ref 0–1)
Basophils Absolute: 0.1 10*3/uL (ref 0.0–0.1)
EOS ABS: 0.3 10*3/uL (ref 0.0–0.7)
Eosinophils Relative: 4 % (ref 0–5)
HCT: 38.8 % (ref 36.0–46.0)
Hemoglobin: 13 g/dL (ref 12.0–15.0)
Lymphocytes Relative: 37 % (ref 12–46)
Lymphs Abs: 2.6 10*3/uL (ref 0.7–4.0)
MCH: 30 pg (ref 26.0–34.0)
MCHC: 33.5 g/dL (ref 30.0–36.0)
MCV: 89.4 fL (ref 78.0–100.0)
MONO ABS: 0.5 10*3/uL (ref 0.1–1.0)
MONOS PCT: 7 % (ref 3–12)
MPV: 9.5 fL (ref 8.6–12.4)
NEUTROS PCT: 51 % (ref 43–77)
Neutro Abs: 3.5 10*3/uL (ref 1.7–7.7)
PLATELETS: 190 10*3/uL (ref 150–400)
RBC: 4.34 MIL/uL (ref 3.87–5.11)
RDW: 15.3 % (ref 11.5–15.5)
WBC: 6.9 10*3/uL (ref 4.0–10.5)

## 2015-12-21 LAB — HEPATIC FUNCTION PANEL
ALT: 21 U/L (ref 6–29)
AST: 23 U/L (ref 10–35)
Albumin: 4.2 g/dL (ref 3.6–5.1)
Alkaline Phosphatase: 81 U/L (ref 33–130)
BILIRUBIN DIRECT: 0.1 mg/dL (ref <=0.2)
BILIRUBIN TOTAL: 0.5 mg/dL (ref 0.2–1.2)
Indirect Bilirubin: 0.4 mg/dL (ref 0.2–1.2)
Total Protein: 6.8 g/dL (ref 6.1–8.1)

## 2015-12-21 LAB — URIC ACID: Uric Acid, Serum: 5.2 mg/dL (ref 2.4–7.0)

## 2015-12-21 LAB — MAGNESIUM: MAGNESIUM: 1.7 mg/dL (ref 1.5–2.5)

## 2015-12-21 MED ORDER — RANITIDINE HCL 300 MG PO TABS: ORAL_TABLET | ORAL | Status: DC

## 2015-12-21 MED ORDER — GABAPENTIN 100 MG PO CAPS: ORAL_CAPSULE | ORAL | Status: DC

## 2015-12-21 MED ORDER — AMITRIPTYLINE HCL 25 MG PO TABS: ORAL_TABLET | ORAL | Status: DC

## 2015-12-21 NOTE — Patient Instructions (Signed)
Recommend Adult Low Dose Aspirin or   coated  Aspirin 81 mg daily   To reduce risk of Colon Cancer 20 %,   Skin Cancer 26 % ,   Melanoma 46%   and   Pancreatic cancer 60%   ++++++++++++++++++++++++++++++++++++++++++++++++++++++  Vitamin D goal   is between 70-100.   Please make sure that you are taking your Vitamin D as directed.   It is very important as a natural anti-inflammatory   helping hair, skin, and nails, as well as reducing stroke and heart attack risk.   It helps your bones and helps with mood.  It also decreases numerous cancer risks so please take it as directed.   Low Vit D is associated with a 200-300% higher risk for CANCER   and 200-300% higher risk for HEART   ATTACK  &  STROKE.   ......................................  It is also associated with higher death rate at younger ages,   autoimmune diseases like Rheumatoid arthritis, Lupus, Multiple Sclerosis.     Also many other serious conditions, like depression, Alzheimer's  Dementia, infertility, muscle aches, fatigue, fibromyalgia - just to name a few.  ++++++++++++++++++++++++++++++++++++++++++++++++  Recommend the book "The END of DIETING" by Dr Joel Fuhrman   & the book "The END of DIABETES " by Dr Joel Fuhrman  At [employer] - get book & Audio CD's     Being diabetic has a  300% increased risk for heart attack, stroke, cancer, and alzheimer- type vascular dementia. It is very important that you work harder with diet by avoiding all foods that are white. Avoid white rice (brown & wild rice is OK), white potatoes (sweetpotatoes in moderation is OK), White bread or wheat bread or anything made out of white flour like bagels, donuts, rolls, buns, biscuits, cakes, pastries, cookies, pizza crust, and pasta (made from white flour & egg whites) - vegetarian pasta or spinach or wheat pasta is OK. Multigrain breads like Arnold's or [employer], or multigrain sandwich thins or flatbreads.  Diet,  exercise and weight loss can reverse and cure diabetes in the early stages.  Diet, exercise and weight loss is very important in the control and prevention of complications of diabetes which affects every system in your body, ie. Brain - dementia/stroke, eyes - glaucoma/blindness, heart - heart attack/heart failure, kidneys - dialysis, stomach - gastric paralysis, intestines - malabsorption, nerves - severe painful neuritis, circulation - gangrene & loss of a leg(s), and finally cancer and Alzheimers.    I recommend avoid fried & greasy foods,  sweets/candy, white rice (brown or wild rice or Quinoa is OK), white potatoes (sweet potatoes are OK) - anything made from white flour - bagels, doughnuts, rolls, buns, biscuits,white and wheat breads, pizza crust and traditional pasta made of white flour & egg white(vegetarian pasta or spinach or wheat pasta is OK).  Multi-grain bread is OK - like multi-grain flat bread or sandwich thins. Avoid alcohol in excess. Exercise is also important.    Eat all the vegetables you want - avoid meat, especially red meat and dairy - especially cheese.  Cheese is the most concentrated form of trans-fats which is the worst thing to clog up our arteries. Veggie cheese is OK which can be found in the fresh produce section at [employer] or [employer] or [employer]  ++++++++++++++++++++++++++++++++++++++++++++++++++ DASH Eating Plan  DASH stands for "Dietary Approaches to Stop Hypertension."   The DASH eating plan is a healthy eating plan that has been shown to reduce high   blood pressure (hypertension). Additional health benefits may include reducing the risk of type 2 diabetes mellitus, heart disease, and stroke. The DASH eating plan may also help with weight loss.  WHAT DO I NEED TO KNOW ABOUT THE DASH EATING PLAN? For the DASH eating plan, you will follow these general guidelines:  Choose foods with a percent daily value for sodium of less than 5% (as listed on the food  label).  Use salt-free seasonings or herbs instead of table salt or sea salt.  Check with your health care provider or pharmacist before using salt substitutes.  Eat lower-sodium products, often labeled as "lower sodium" or "no salt added."  Eat fresh foods.  Eat more vegetables, fruits, and low-fat dairy products.    Choose whole grains. Look for the word "whole" as the first word in the ingredient list.  Choose fish   Limit sweets, desserts, sugars, and sugary drinks.  Choose heart-healthy fats.  Eat veggie cheese   Eat more home-cooked food and less restaurant, buffet, and fast food.  Limit fried foods.  Cook foods using methods other than frying.  Limit canned vegetables. If you do use them, rinse them well to decrease the sodium.  When eating at a restaurant, ask that your food be prepared with less salt, or no salt if possible.                      WHAT FOODS CAN I EAT?  Seek help from a dietitian for individual calorie needs.  Grains Whole grain or whole wheat bread. Brown rice. Whole grain or whole wheat pasta. Quinoa, bulgur, and whole grain cereals. Low-sodium cereals. Corn or whole wheat flour tortillas. Whole grain cornbread. Whole grain crackers. Low-sodium crackers.  Vegetables Fresh or frozen vegetables (raw, steamed, roasted, or grilled). Low-sodium or reduced-sodium tomato and vegetable juices. Low-sodium or reduced-sodium tomato sauce and paste. Low-sodium or reduced-sodium canned vegetables.   Fruits All fresh, canned (in natural juice), or frozen fruits.  Protein Products  All fish and seafood.  Dried beans, peas, or lentils. Unsalted nuts and seeds. Unsalted canned beans.  Dairy Low-fat dairy products, such as skim or 1% milk, 2% or reduced-fat cheeses, low-fat ricotta or cottage cheese, or plain low-fat yogurt. Low-sodium or reduced-sodium cheeses.  Fats and Oils Tub margarines without trans fats. Light or reduced-fat mayonnaise and salad  dressings (reduced sodium). Avocado. Safflower, olive, or canola oils. Natural peanut or almond butter.  Other Unsalted popcorn and pretzels. The items listed above may not be a complete list of recommended foods or beverages. Contact your dietitian for more options.  +++++++++++++++++++++++++++++++++++++++++++  WHAT FOODS ARE NOT RECOMMENDED?  Grains/ White flour or wheat flour  White bread. White pasta. White rice. Refined cornbread. Bagels and croissants. Crackers that contain trans fat.  Vegetables  Creamed or fried vegetables. Vegetables in a . Regular canned vegetables. Regular canned tomato sauce and paste. Regular tomato and vegetable juices.  Fruits Dried fruits. Canned fruit in light or heavy syrup. Fruit juice.  Meat and Other Protein Products Meat in general. Fatty cuts of meat. Ribs, chicken wings, bacon, sausage, bologna, salami, chitterlings, fatback, hot dogs, bratwurst, and packaged luncheon meats.  Dairy Whole or 2% milk, cream, half-and-half, and cream cheese. Whole-fat or sweetened yogurt. Full-fat cheeses or blue cheese. Nondairy creamers and whipped toppings. Processed cheese, cheese spreads, or cheese curds.  Condiments Onion and garlic salt, seasoned salt, table salt, and sea salt. Canned and packaged gravies. Worcestershire sauce. Tartar sauce.  Barbecue sauce. Teriyaki sauce. Soy sauce, including reduced sodium. Steak sauce. Fish sauce. Oyster sauce. Cocktail sauce. Horseradish. Ketchup and mustard. Meat flavorings and tenderizers. Bouillon cubes. Hot sauce. Tabasco sauce. Marinades. Taco seasonings. Relishes.  Fats and Oils Butter, stick margarine, lard, shortening, ghee, and bacon fat. Coconut, palm kernel, or palm oils. Regular salad dressings.  Pickles and olives. Salted popcorn and pretzels. The items listed above may not be a complete list of foods and beverages to avoid.   Preventive Care for Adults  A healthy lifestyle and preventive care can  promote health and wellness. Preventive health guidelines for women include the following key practices.  A routine yearly physical is a good way to check with your health care provider about your health and preventive screening. It is a chance to share any concerns and updates on your health and to receive a thorough exam.  Visit your dentist for a routine exam and preventive care every 6 months. Brush your teeth twice a day and floss once a day. Good oral hygiene prevents tooth decay and gum disease.  The frequency of eye exams is based on your age, health, family medical history, use of contact lenses, and other factors. Follow your health care provider's recommendations for frequency of eye exams.  Eat a healthy diet. Foods like vegetables, fruits, whole grains, low-fat dairy products, and lean protein foods contain the nutrients you need without too many calories. Decrease your intake of foods high in solid fats, added sugars, and salt. Eat the right amount of calories for you.Get information about a proper diet from your health care provider, if necessary.  Regular physical exercise is one of the most important things you can do for your health. Most adults should get at least 150 minutes of moderate-intensity exercise (any activity that increases your heart rate and causes you to sweat) each week. In addition, most adults need muscle-strengthening exercises on 2 or more days a week.  Maintain a healthy weight. The body mass index (BMI) is a screening tool to identify possible weight problems. It provides an estimate of body fat based on height and weight. Your health care provider can find your BMI and can help you achieve or maintain a healthy weight.For adults 20 years and older:  A BMI below 18.5 is considered underweight.  A BMI of 18.5 to 24.9 is normal.  A BMI of 25 to 29.9 is considered overweight.  A BMI of 30 and above is considered obese.  Maintain normal blood lipids and  cholesterol levels by exercising and minimizing your intake of saturated fat. Eat a balanced diet with plenty of fruit and vegetables. If your lipid or cholesterol levels are high, you are over 50, or you are at high risk for heart disease, you may need your cholesterol levels checked more frequently.Ongoing high lipid and cholesterol levels should be treated with medicines if diet and exercise are not working.  If you smoke, find out from your health care provider how to quit. If you do not use tobacco, do not start.  Lung cancer screening is recommended for adults aged 63-80 years who are at high risk for developing lung cancer because of a history of smoking. A yearly low-dose CT scan of the lungs is recommended for people who have at least a 30-pack-year history of smoking and are a current smoker or have quit within the past 15 years. A pack year of smoking is smoking an average of 1 pack of cigarettes a day for  1 year (for example: 1 pack a day for 30 years or 2 packs a day for 15 years). Yearly screening should continue until the smoker has stopped smoking for at least 15 years. Yearly screening should be stopped for people who develop a health problem that would prevent them from having lung cancer treatment.  Avoid use of street drugs. Do not share needles with anyone. Ask for help if you need support or instructions about stopping the use of drugs.  High blood pressure causes heart disease and increases the risk of stroke.  Ongoing high blood pressure should be treated with medicines if weight loss and exercise do not work.  If you are 72-14 years old, ask your health care provider if you should take aspirin to prevent strokes.  Diabetes screening involves taking a blood sample to check your fasting blood sugar level. This should be done once every 3 years, after age 59, if you are within normal weight and without risk factors for diabetes. Testing should be considered at a younger age or be  carried out more frequently if you are overweight and have at least 1 risk factor for diabetes.  Breast cancer screening is essential preventive care for women. You should practice "breast self-awareness." This means understanding the normal appearance and feel of your breasts and may include breast self-examination. Any changes detected, no matter how small, should be reported to a health care provider. Women in their 44s and 30s should have a clinical breast exam (CBE) by a health care provider as part of a regular health exam every 1 to 3 years. After age 88, women should have a CBE every year. Starting at age 7, women should consider having a mammogram (breast X-ray test) every year. Women who have a family history of breast cancer should talk to their health care provider about genetic screening. Women at a high risk of breast cancer should talk to their health care providers about having an MRI and a mammogram every year.  Breast cancer gene (BRCA)-related cancer risk assessment is recommended for women who have family members with BRCA-related cancers. BRCA-related cancers include breast, ovarian, tubal, and peritoneal cancers. Having family members with these cancers may be associated with an increased risk for harmful changes (mutations) in the breast cancer genes BRCA1 and BRCA2. Results of the assessment will determine the need for genetic counseling and BRCA1 and BRCA2 testing.  Routine pelvic exams to screen for cancer are no longer recommended for nonpregnant women who are considered low risk for cancer of the pelvic organs (ovaries, uterus, and vagina) and who do not have symptoms. Ask your health care provider if a screening pelvic exam is right for you.  If you have had past treatment for cervical cancer or a condition that could lead to cancer, you need Pap tests and screening for cancer for at least 20 years after your treatment. If Pap tests have been discontinued, your risk factors  (such as having a new sexual partner) need to be reassessed to determine if screening should be resumed. Some women have medical problems that increase the chance of getting cervical cancer. In these cases, your health care provider may recommend more frequent screening and Pap tests.    Colorectal cancer can be detected and often prevented. Most routine colorectal cancer screening begins at the age of 35 years and continues through age 53 years. However, your health care provider may recommend screening at an earlier age if you have risk factors for colon  cancer. On a yearly basis, your health care provider may provide home test kits to check for hidden blood in the stool. Use of a small camera at the end of a tube, to directly examine the colon (sigmoidoscopy or colonoscopy), can detect the earliest forms of colorectal cancer. Talk to your health care provider about this at age 49, when routine screening begins. Direct exam of the colon should be repeated every 5-10 years through age 64 years, unless early forms of pre-cancerous polyps or small growths are found.  Osteoporosis is a disease in which the bones lose minerals and strength with aging. This can result in serious bone fractures or breaks. The risk of osteoporosis can be identified using a bone density scan. Women ages 79 years and over and women at risk for fractures or osteoporosis should discuss screening with their health care providers. Ask your health care provider whether you should take a calcium supplement or vitamin D to reduce the rate of osteoporosis.  Menopause can be associated with physical symptoms and risks. Hormone replacement therapy is available to decrease symptoms and risks. You should talk to your health care provider about whether hormone replacement therapy is right for you.  Use sunscreen. Apply sunscreen liberally and repeatedly throughout the day. You should seek shade when your shadow is shorter than you. Protect  yourself by wearing long sleeves, pants, a wide-brimmed hat, and sunglasses year round, whenever you are outdoors.  Once a month, do a whole body skin exam, using a mirror to look at the skin on your back. Tell your health care provider of new moles, moles that have irregular borders, moles that are larger than a pencil eraser, or moles that have changed in shape or color.  Stay current with required vaccines (immunizations).  Influenza vaccine. All adults should be immunized every year.  Tetanus, diphtheria, and acellular pertussis (Td, Tdap) vaccine. Pregnant women should receive 1 dose of Tdap vaccine during each pregnancy. The dose should be obtained regardless of the length of time since the last dose. Immunization is preferred during the 27th-36th week of gestation. An adult who has not previously received Tdap or who does not know her vaccine status should receive 1 dose of Tdap. This initial dose should be followed by tetanus and diphtheria toxoids (Td) booster doses every 10 years. Adults with an unknown or incomplete history of completing a 3-dose immunization series with Td-containing vaccines should begin or complete a primary immunization series including a Tdap dose. Adults should receive a Td booster every 10 years.    Zoster vaccine. One dose is recommended for adults aged 77 years or older unless certain conditions are present.    Pneumococcal 13-valent conjugate (PCV13) vaccine. When indicated, a person who is uncertain of her immunization history and has no record of immunization should receive the PCV13 vaccine. An adult aged 68 years or older who has certain medical conditions and has not been previously immunized should receive 1 dose of PCV13 vaccine. This PCV13 should be followed with a dose of pneumococcal polysaccharide (PPSV23) vaccine. The PPSV23 vaccine dose should be obtained at least 8 weeks after the dose of PCV13 vaccine. An adult aged 13 years or older who has  certain medical conditions and previously received 1 or more doses of PPSV23 vaccine should receive 1 dose of PCV13. The PCV13 vaccine dose should be obtained 1 or more years after the last PPSV23 vaccine dose.    Pneumococcal polysaccharide (PPSV23) vaccine. When PCV13 is also indicated,  PCV13 should be obtained first. All adults aged 65 years and older should be immunized. An adult younger than age 65 years who has certain medical conditions should be immunized. Any person who resides in a nursing home or long-term care facility should be immunized. An adult smoker should be immunized. People with an immunocompromised condition and certain other conditions should receive both PCV13 and PPSV23 vaccines. People with human immunodeficiency virus (HIV) infection should be immunized as soon as possible after diagnosis. Immunization during chemotherapy or radiation therapy should be avoided. Routine use of PPSV23 vaccine is not recommended for American Indians, [place] Natives, or people younger than 65 years unless there are medical conditions that require PPSV23 vaccine. When indicated, people who have unknown immunization and have no record of immunization should receive PPSV23 vaccine. One-time revaccination 5 years after the first dose of PPSV23 is recommended for people aged 19-64 years who have chronic kidney failure, nephrotic syndrome, asplenia, or immunocompromised conditions. People who received 1-2 doses of PPSV23 before age 65 years should receive another dose of PPSV23 vaccine at age 65 years or later if at least 5 years have passed since the previous dose. Doses of PPSV23 are not needed for people immunized with PPSV23 at or after age 65 years.   Preventive Services / Frequency  Ages 65 years and over  Blood pressure check.  Lipid and cholesterol check.  Lung cancer screening. / Every year if you are aged 55-80 years and have a 30-pack-year history of smoking and currently smoke or have quit  within the past 15 years. Yearly screening is stopped once you have quit smoking for at least 15 years or develop a health problem that would prevent you from having lung cancer treatment.  Clinical breast exam.** / Every year after age 40 years.  BRCA-related cancer risk assessment.** / For women who have family members with a BRCA-related cancer (breast, ovarian, tubal, or peritoneal cancers).  Mammogram.** / Every year beginning at age 40 years and continuing for as long as you are in good health. Consult with your health care provider.  Pap test.** / Every 3 years starting at age 30 years through age 65 or 70 years with 3 consecutive normal Pap tests. Testing can be stopped between 65 and 70 years with 3 consecutive normal Pap tests and no abnormal Pap or HPV tests in the past 10 years.  Fecal occult blood test (FOBT) of stool. / Every year beginning at age 50 years and continuing until age 75 years. You may not need to do this test if you get a colonoscopy every 10 years.  Flexible sigmoidoscopy or colonoscopy.** / Every 5 years for a flexible sigmoidoscopy or every 10 years for a colonoscopy beginning at age 50 years and continuing until age 75 years.  Hepatitis C blood test.** / For all people born from 1945 through 1965 and any individual with known risks for hepatitis C.  Osteoporosis screening.** / A one-time screening for women ages 65 years and over and women at risk for fractures or osteoporosis.  Skin self-exam. / Monthly.  Influenza vaccine. / Every year.  Tetanus, diphtheria, and acellular pertussis (Tdap/Td) vaccine.** / 1 dose of Td every 10 years.  Zoster vaccine.** / 1 dose for adults aged 60 years or older.  Pneumococcal 13-valent conjugate (PCV13) vaccine.** / Consult your health care provider.  Pneumococcal polysaccharide (PPSV23) vaccine.** / 1 dose for all adults aged 65 years and older. Screening for abdominal aortic aneurysm (AAA)  by ultrasound   is recommended  for people who have history of high blood pressure or who are current or former smokers.

## 2015-12-21 NOTE — Progress Notes (Signed)
Patient ID: Kayla Baker, female   DOB: 07-15-1936, 80 y.o.   MRN: [id number]  Annual Screening/Preventative Visit And Comprehensive Evaluation &  Examination     This very nice 80 y.o. MWF presents for presents for a Wellness/Preventative Visit & comprehensive evaluation and management of multiple medical co-morbidities.  Patient has been followed for HTN, T2_NIDDM  Prediabetes, Hyperlipidemia and Vitamin D Deficiency. Patient also has hx/o Gout - in remission on treatment with Allopurinol. Patient also has GERD and had tapered off of Omeprazole onto Ranitidine and how has tapered ranitidine to infreq prn dosing .       HTN predates since 2000. Patient's BP has been controlled at home and patient denies any cardiac symptoms as chest pain, palpitations, shortness of breath, dizziness or ankle swelling. Today's BP: 104/62 mmHg      Patient's hyperlipidemia is controlled with diet and medications. Patient denies myalgias or other medication SE's. Last lipids were at goal with Cholesterol 157; HDL 50; LDL 73; Triglycerides 168 on 09/20/2015.     Patient has T2_NIDDM predating since Dec 2010 with A1c 6.6% and patient has attempted to control this with diet alone. Patient denies reactive hypoglycemic symptoms, visual blurring or diabetic polys. Patient is felt to have a peripheral sensory neuropathy with painful paresthesias of her feet (R>L) which she she describes as shooting pains or "like an electrical shock". She is taking low dose amitriptyline and apparently had some type of dysphoric reaction to Lyrica.  Last A1c was  6.5% on 09/20/2015.     Finally, patient has history of Vitamin D Deficiency of "28" in 2008 and last Vitamin D was 55 on 09/20/2015.    Medication Sig  . allopurinol  300 MG  TAKE 1 TABLET BY MOUTH EVERY DAY  . amitriptyline  10 MG  TAKE 2 TABLETS BY MOUTH AT BEDTIME  . aspirin 81 MG Take 81 mg by mouth daily.  Marland Kitchen atorvastatin  40 MG TAKE 1 TABLET BY MOUTH EVERY DAY  . VITAMIN D 1000  UNITS Take 1,000 Units by mouth daily.  Marland Kitchen CINNAMON Take 1,000 mg by mouth 2 (two) times daily.  . enalapril 20 MG TAKE 1 TABLET BY MOUTH DAILY  . estradiol  1 MG  Take 1 tablet (1 mg total) by mouth daily.  . hctz 25 MG TAKE 1 TABLET BY MOUTH DAILY  . Magnesium 250 MG  Take 250 mg by mouth 2 (two) times daily. Takes 2 tabs in am and 2 tabs in pm  . Multiple Vitamin  Take 1 tablet by mouth daily.  . Omega-3 FISH OIL 1200 MG Take by mouth daily. Takes 2 capsules daily  . pregabalin  50 MG  Take 1 capsule (50 mg total) by mouth 3 (three) times daily. - Patient not taking  . ranitidine  300 MG  Take twice a day while trying to get off PPI, then can go to once at night  . vitamin B-12  1000 MCG tab Take 1,000 mcg by mouth daily. Takes 1 tablet every other day  . vitamin C  500 MG tablet Take 500 mg by mouth daily. Takes 2 tablets daily   Allergies  Allergen Reactions  . Keflex [Cephalexin] Swelling    Tongue and throat swells  . Meloxicam   . Prednisone     High doses  . Premarin [Estrogens Conjugated]   . Trovan [Alatrofloxacin]    Past Medical History  Diagnosis Date  . Hypertension   . Hyperlipidemia   .  GERD (gastroesophageal reflux disease)   . Gout   . Allergy   . Diet-controlled type 2 diabetes mellitus ([place])   . Vitamin D deficiency   . Anemia    Health Maintenance  Topic Date Due  . OPHTHALMOLOGY EXAM  11/01/2015  . FOOT EXAM  03/15/2016  . HEMOGLOBIN A1C  03/20/2016  . INFLUENZA VACCINE  07/01/2016  . COLONOSCOPY  03/31/2018  . TETANUS/TDAP  08/15/2019  . DEXA SCAN  Completed  . ZOSTAVAX  Completed  . PNA vac Low Risk Adult  Completed   Immunization History  Administered Date(s) Administered  . DTaP 07/01/2004  . Influenza Whole 08/15/2013  . Influenza, High Dose Seasonal PF 08/28/2014, 09/20/2015  . Pneumococcal Conjugate-13 09/20/2015  . Pneumococcal Polysaccharide-23 11/01/2009  . Td 08/10/2009  . Zoster 08/02/2011   Past Surgical History  Procedure  Laterality Date  . Cataract extraction, bilateral  2014    right eye 2/14; left eye 3/3  . Abdominal hysterectomy  1973  . Incontinence surgery  2006  . Knee arthroscopy      [id number] right  . Hand surgery  2012    left   Family History  Problem Relation Age of Onset  . Colon cancer Neg Hx   . Stomach cancer Neg Hx   . Stroke Mother   . Diabetes Father   . Hypertension Sister   . Cancer Sister   . Cancer Sister    Social History  Substance Use Topics  . Smoking status: Never Smoker   . Smokeless tobacco: Never Used  . Alcohol Use: No    ROS Constitutional: Denies fever, chills, weight loss/gain, headaches, insomnia,  night sweats, and change in appetite. Does c/o fatigue. Eyes: Denies redness, blurred vision, diplopia, discharge, itchy, watery eyes.  ENT: Denies discharge, congestion, post nasal drip, epistaxis, sore throat, earache, hearing loss, dental pain, Tinnitus, Vertigo, Sinus pain, snoring.  Cardio: Denies chest pain, palpitations, irregular heartbeat, syncope, dyspnea, diaphoresis, orthopnea, PND, claudication, edema Respiratory: denies cough, dyspnea, DOE, pleurisy, hoarseness, laryngitis, wheezing.  Gastrointestinal: Denies dysphagia, heartburn, reflux, water brash, pain, cramps, nausea, vomiting, bloating, diarrhea, constipation, hematemesis, melena, hematochezia, jaundice, hemorrhoids Genitourinary: Denies dysuria, frequency, urgency, nocturia, hesitancy, discharge, hematuria, flank pain Breast: Breast lumps, nipple discharge, bleeding.  Musculoskeletal: Denies arthralgia, myalgia, stiffness, Jt. Swelling, pain, limp, and strain/sprain. Denies falls. Skin: Denies puritis, rash, hives, warts, acne, eczema, changing in skin lesion Neuro: No weakness, tremor, incoordination, spasms, paresthesia, pain Psychiatric: Denies confusion, memory loss, sensory loss. Denies Depression. Endocrine: Denies change in weight, skin, hair change and nocturia .  Heme/Lymph: No  excessive bleeding, bruising, enlarged lymph nodes.  Physical Exam  BP 104/62 mmHg  Pulse 88  Temp(Src) 97.2 F (36.2 C)  Resp 16  Ht 5' 4"  (1.626 m)  Wt 150 lb 3.2 oz (68.13 kg)  BMI 25.77 kg/m2  General Appearance: Well nourished and in no apparent distress. Eyes: PERRLA, EOMs, conjunctiva no swelling or erythema, normal fundi and vessels. Sinuses: No frontal/maxillary tenderness ENT/Mouth: EACs patent / TMs  nl. Nares clear without erythema, swelling, mucoid exudates. Oral hygiene is good. No erythema, swelling, or exudate. Tongue normal, non-obstructing. Tonsils not swollen or erythematous. Hearing normal.  Neck: Supple, thyroid normal. No bruits, nodes or JVD. Respiratory: Respiratory effort normal.  BS equal and clear bilateral without rales, rhonci, wheezing or stridor. Cardio: Heart sounds are normal with regular rate and rhythm and no murmurs, rubs or gallops. Peripheral pulses are normal and equal bilaterally without edema. No aortic or  femoral bruits. Chest: symmetric with normal excursions and percussion. Breasts: Symmetric, without lumps, nipple discharge, retractions, or fibrocystic changes.  Abdomen: Flat, soft, with bowl sounds. Nontender, no guarding, rebound, hernias, masses, or organomegaly.  Lymphatics: Non tender without lymphadenopathy.  Musculoskeletal: Full ROM all peripheral extremities, joint stability, 5/5 strength, and normal gait. Skin: Warm and dry without rashes, lesions, cyanosis, clubbing or  ecchymosis.  Neuro: Cranial nerves intact, reflexes equal bilaterally. Normal muscle tone, no cerebellar symptoms. Sensation decreased in a stocking distribution bilaterally to the toes by touch, Vibratory and Monofilament testing.  Pysch: Alert and oriented X 3, normal affect, Insight and Judgment appropriate.   Assessment and Plan  1. Annual Preventative Screening Examination   - Microalbumin / creatinine urine ratio - EKG 12-Lead - [place], RETROPERITNL ABD,   LTD - POC Hemoccult Bld/Stl (3-Cd Home Screen); Future - Urinalysis, Routine w reflex microscopic (not at [facility]) - Uric acid - CBC with Differential/Platelet - BASIC METABOLIC PANEL WITH GFR - Hepatic function panel - Magnesium - Lipid panel - TSH - Hemoglobin A1c - Insulin, random - VITAMIN D 25 Hydroxy  2. Essential hypertension  - Microalbumin / creatinine urine ratio - EKG 12-Lead - [place], RETROPERITNL ABD,  LTD - TSH  3. Hyperlipidemia  - Lipid panel - TSH  4. Type 2 diabetes mellitus with stage 3 chronic kidney disease, without long-term current use of insulin (HCC)  - Hemoglobin A1c - Insulin, random  5. Vitamin D deficiency  - VITAMIN D 25 Hydroxy   6. Idiopathic gout  - Uric acid  7. Screening for rectal cancer  - POC Hemoccult Bld/Stl   8. Depression screen   9. Medication management  - Urinalysis, Routine w reflex microscopic  - CBC with Differential/Platelet - BASIC METABOLIC PANEL WITH GFR - Hepatic function panel - Magnesium   Continue prudent diet as discussed, weight control, BP monitoring, regular exercise, and medications. Discussed med's effects and SE's. Screening labs and tests as requested with regular follow-up as recommended. Over 40 minutes of exam, counseling, chart review and high complex critical decision making was performed.

## 2015-12-22 LAB — MICROALBUMIN / CREATININE URINE RATIO
Creatinine, Urine: 112 mg/dL (ref 20–320)
Microalb Creat Ratio: 4 mcg/mg creat (ref <30)
Microalb, Ur: 0.5 mg/dL

## 2015-12-22 LAB — URINALYSIS, MICROSCOPIC ONLY
Bacteria, UA: NONE SEEN [HPF]
Casts: NONE SEEN [LPF]
Crystals: NONE SEEN [HPF]
Yeast: NONE SEEN [HPF]

## 2015-12-22 LAB — URINALYSIS, ROUTINE W REFLEX MICROSCOPIC
Bilirubin Urine: NEGATIVE
GLUCOSE, UA: NEGATIVE
Hgb urine dipstick: NEGATIVE
Ketones, ur: NEGATIVE
Nitrite: NEGATIVE
PH: 6 (ref 5.0–8.0)
Protein, ur: NEGATIVE
Specific Gravity, Urine: 1.014 (ref 1.001–1.035)

## 2015-12-22 LAB — TSH: TSH: 2.535 u[IU]/mL (ref 0.350–4.500)

## 2015-12-22 LAB — HEMOGLOBIN A1C
HEMOGLOBIN A1C: 6.3 % — AB (ref <5.7)
Mean Plasma Glucose: 134 mg/dL — ABNORMAL HIGH (ref <117)

## 2015-12-22 LAB — VITAMIN D 25 HYDROXY (VIT D DEFICIENCY, FRACTURES): VIT D 25 HYDROXY: 64 ng/mL (ref 30–100)

## 2015-12-24 ENCOUNTER — Encounter: Payer: Self-pay | Admitting: Internal Medicine

## 2015-12-24 LAB — INSULIN, RANDOM: INSULIN: 43.4 u[IU]/mL — AB (ref 2.0–19.6)

## 2016-01-14 ENCOUNTER — Other Ambulatory Visit: Payer: Self-pay | Admitting: *Deleted

## 2016-01-14 DIAGNOSIS — Z1212 Encounter for screening for malignant neoplasm of rectum: Secondary | ICD-10-CM

## 2016-01-14 DIAGNOSIS — Z0001 Encounter for general adult medical examination with abnormal findings: Secondary | ICD-10-CM

## 2016-01-14 LAB — POC HEMOCCULT BLD/STL (HOME/3-CARD/SCREEN)
Card #3 Fecal Occult Blood, POC: NEGATIVE
FECAL OCCULT BLD: NEGATIVE
Fecal Occult Blood, POC: NEGATIVE

## 2016-01-28 ENCOUNTER — Other Ambulatory Visit: Payer: Self-pay

## 2016-01-28 DIAGNOSIS — Z1231 Encounter for screening mammogram for malignant neoplasm of breast: Secondary | ICD-10-CM

## 2016-02-15 ENCOUNTER — Ambulatory Visit
Admission: RE | Admit: 2016-02-15 | Discharge: 2016-02-15 | Disposition: A | Discharge status: Home / Self Care | Payer: PPO | Source: Ambulatory Visit

## 2016-02-15 ENCOUNTER — Other Ambulatory Visit: Payer: Self-pay

## 2016-02-15 DIAGNOSIS — N6310 Unspecified lump in the right breast, unspecified quadrant: Secondary | ICD-10-CM

## 2016-02-15 DIAGNOSIS — N6320 Unspecified lump in the left breast, unspecified quadrant: Secondary | ICD-10-CM

## 2016-02-15 DIAGNOSIS — N63 Unspecified lump in breast: Secondary | ICD-10-CM | POA: Diagnosis not present

## 2016-02-15 DIAGNOSIS — N6011 Diffuse cystic mastopathy of right breast: Secondary | ICD-10-CM | POA: Diagnosis not present

## 2016-02-15 DIAGNOSIS — Z1231 Encounter for screening mammogram for malignant neoplasm of breast: Secondary | ICD-10-CM

## 2016-02-23 ENCOUNTER — Encounter: Payer: Self-pay | Admitting: *Deleted

## 2016-02-26 ENCOUNTER — Other Ambulatory Visit: Payer: Self-pay

## 2016-02-26 ENCOUNTER — Ambulatory Visit
Admission: RE | Admit: 2016-02-26 | Discharge: 2016-02-26 | Disposition: A | Discharge status: Home / Self Care | Payer: PPO | Source: Ambulatory Visit

## 2016-02-26 DIAGNOSIS — C50212 Malignant neoplasm of upper-inner quadrant of left female breast: Secondary | ICD-10-CM | POA: Diagnosis not present

## 2016-02-26 DIAGNOSIS — N6320 Unspecified lump in the left breast, unspecified quadrant: Secondary | ICD-10-CM

## 2016-02-26 DIAGNOSIS — D0512 Intraductal carcinoma in situ of left breast: Secondary | ICD-10-CM | POA: Diagnosis not present

## 2016-02-26 DIAGNOSIS — N63 Unspecified lump in breast: Secondary | ICD-10-CM | POA: Diagnosis not present

## 2016-02-26 HISTORY — PX: BREAST BIOPSY: SHX20

## 2016-02-27 ENCOUNTER — Ambulatory Visit
Admission: RE | Admit: 2016-02-27 | Discharge: 2016-02-27 | Disposition: A | Discharge status: Home / Self Care | Payer: PPO | Source: Ambulatory Visit

## 2016-02-27 DIAGNOSIS — N6320 Unspecified lump in the left breast, unspecified quadrant: Secondary | ICD-10-CM

## 2016-02-27 DIAGNOSIS — D0512 Intraductal carcinoma in situ of left breast: Secondary | ICD-10-CM | POA: Diagnosis not present

## 2016-02-29 ENCOUNTER — Telehealth: Payer: Self-pay | Admitting: *Deleted

## 2016-02-29 ENCOUNTER — Encounter: Payer: Self-pay | Admitting: *Deleted

## 2016-02-29 DIAGNOSIS — Z853 Personal history of malignant neoplasm of breast: Secondary | ICD-10-CM | POA: Insufficient documentation

## 2016-02-29 DIAGNOSIS — C50212 Malignant neoplasm of upper-inner quadrant of left female breast: Secondary | ICD-10-CM

## 2016-02-29 HISTORY — DX: Malignant neoplasm of upper-inner quadrant of left female breast: C50.212

## 2016-02-29 NOTE — Telephone Encounter (Signed)
Confirmed [facility] for 03/05/16 at 0815 .  Instructions and contact information given.

## 2016-02-29 NOTE — Telephone Encounter (Signed)
Pt left vm stating she would like PM [facility] on 03/05/16. Called pt back, received identified vm. Informed pt we will schedule her for PM [facility] at 1215 and request a return call to discuss clinic. Contact information provided.

## 2016-03-05 ENCOUNTER — Ambulatory Visit: Payer: PPO | Attending: General Surgery | Admitting: Physical Therapy

## 2016-03-05 ENCOUNTER — Other Ambulatory Visit (HOSPITAL_BASED_OUTPATIENT_CLINIC_OR_DEPARTMENT_OTHER): Payer: PPO

## 2016-03-05 ENCOUNTER — Ambulatory Visit
Admission: RE | Admit: 2016-03-05 | Discharge: 2016-03-05 | Disposition: A | Discharge status: Home / Self Care | Payer: PPO | Source: Ambulatory Visit | Attending: Radiation Oncology | Admitting: Radiation Oncology

## 2016-03-05 ENCOUNTER — Encounter: Payer: Self-pay | Admitting: Physical Therapy

## 2016-03-05 ENCOUNTER — Encounter: Payer: Self-pay | Admitting: *Deleted

## 2016-03-05 ENCOUNTER — Encounter: Payer: Self-pay | Admitting: Skilled Nursing Facility1

## 2016-03-05 ENCOUNTER — Other Ambulatory Visit: Payer: Self-pay | Admitting: General Surgery

## 2016-03-05 ENCOUNTER — Ambulatory Visit (HOSPITAL_BASED_OUTPATIENT_CLINIC_OR_DEPARTMENT_OTHER): Payer: PPO | Admitting: Hematology and Oncology

## 2016-03-05 ENCOUNTER — Encounter: Payer: Self-pay | Admitting: Hematology and Oncology

## 2016-03-05 VITALS — BP 119/71 | HR 96 | Temp 96.0°F | Resp 18 | Ht 64.0 in | Wt 149.6 lb

## 2016-03-05 DIAGNOSIS — C50212 Malignant neoplasm of upper-inner quadrant of left female breast: Secondary | ICD-10-CM | POA: Diagnosis not present

## 2016-03-05 DIAGNOSIS — R293 Abnormal posture: Secondary | ICD-10-CM | POA: Insufficient documentation

## 2016-03-05 DIAGNOSIS — Z803 Family history of malignant neoplasm of breast: Secondary | ICD-10-CM | POA: Diagnosis not present

## 2016-03-05 DIAGNOSIS — Z170 Estrogen receptor positive status [ER+]: Secondary | ICD-10-CM

## 2016-03-05 DIAGNOSIS — M25511 Pain in right shoulder: Secondary | ICD-10-CM | POA: Insufficient documentation

## 2016-03-05 DIAGNOSIS — M109 Gout, unspecified: Secondary | ICD-10-CM | POA: Diagnosis not present

## 2016-03-05 DIAGNOSIS — M25611 Stiffness of right shoulder, not elsewhere classified: Secondary | ICD-10-CM | POA: Diagnosis not present

## 2016-03-05 DIAGNOSIS — E119 Type 2 diabetes mellitus without complications: Secondary | ICD-10-CM | POA: Diagnosis not present

## 2016-03-05 DIAGNOSIS — Z90710 Acquired absence of both cervix and uterus: Secondary | ICD-10-CM | POA: Diagnosis not present

## 2016-03-05 DIAGNOSIS — I10 Essential (primary) hypertension: Secondary | ICD-10-CM | POA: Diagnosis not present

## 2016-03-05 DIAGNOSIS — E1142 Type 2 diabetes mellitus with diabetic polyneuropathy: Secondary | ICD-10-CM | POA: Diagnosis not present

## 2016-03-05 LAB — CBC WITH DIFFERENTIAL/PLATELET
BASO%: 0.7 % (ref 0.0–2.0)
Basophils Absolute: 0 10*3/uL (ref 0.0–0.1)
EOS%: 4.7 % (ref 0.0–7.0)
Eosinophils Absolute: 0.3 10*3/uL (ref 0.0–0.5)
HCT: 38.5 % (ref 34.8–46.6)
HGB: 13 g/dL (ref 11.6–15.9)
LYMPH#: 2.4 10*3/uL (ref 0.9–3.3)
LYMPH%: 39.8 % (ref 14.0–49.7)
MCH: 30 pg (ref 25.1–34.0)
MCHC: 33.8 g/dL (ref 31.5–36.0)
MCV: 88.9 fL (ref 79.5–101.0)
MONO#: 0.5 10*3/uL (ref 0.1–0.9)
MONO%: 8.2 % (ref 0.0–14.0)
NEUT#: 2.8 10*3/uL (ref 1.5–6.5)
NEUT%: 46.6 % (ref 38.4–76.8)
Platelets: 172 10*3/uL (ref 145–400)
RBC: 4.33 10*6/uL (ref 3.70–5.45)
RDW: 13.7 % (ref 11.2–14.5)
WBC: 6 10*3/uL (ref 3.9–10.3)

## 2016-03-05 LAB — COMPREHENSIVE METABOLIC PANEL
ALT: 28 U/L (ref 0–55)
AST: 26 U/L (ref 5–34)
Albumin: 3.8 g/dL (ref 3.5–5.0)
Alkaline Phosphatase: 84 U/L (ref 40–150)
Anion Gap: 7 mEq/L (ref 3–11)
BUN: 23.2 mg/dL (ref 7.0–26.0)
CHLORIDE: 105 meq/L (ref 98–109)
CO2: 30 meq/L — AB (ref 22–29)
CREATININE: 1.6 mg/dL — AB (ref 0.6–1.1)
Calcium: 10.1 mg/dL (ref 8.4–10.4)
EGFR: 32 mL/min/{1.73_m2} — ABNORMAL LOW (ref >90)
Glucose: 107 mg/dl (ref 70–140)
POTASSIUM: 4 meq/L (ref 3.5–5.1)
SODIUM: 141 meq/L (ref 136–145)
Total Bilirubin: 0.4 mg/dL (ref 0.20–1.20)
Total Protein: 7.1 g/dL (ref 6.4–8.3)

## 2016-03-05 NOTE — Assessment & Plan Note (Signed)
Left breast biopsy 11:00 position 02/18/2016: invasive ductal carcinoma with DCIS, ER 90%, PR 10%, HER-2 negative, Ki-67 30%, grade 2, 2.2 cm palpable lesion T2 N0 stage II a clinical stage  Pathology and radiology counseling:Discussed with the patient, the details of pathology including the type of breast cancer,the clinical staging, the significance of ER, PR and HER-2/neu receptors and the implications for treatment. After reviewing the pathology in detail, we proceeded to discuss the different treatment options between surgery, radiation, chemotherapy, antiestrogen therapies.  Recommendations: 1. Breast conserving surgery followed by 2. Oncotype DX testing to determine if chemotherapy would be of any benefit followed by 3. Adjuvant radiation therapy followed by 4. Adjuvant antiestrogen therapy  Oncotype counseling: I discussed Oncotype DX test. I explained to the patient that this is a 21 gene panel to evaluate patient tumors DNA to calculate recurrence score. This would help determine whether patient has high risk or intermediate risk or low risk breast cancer. She understands that if her tumor was found to be high risk, she would benefit from systemic chemotherapy. If low risk, no need of chemotherapy. If she was found to be intermediate risk, we would need to evaluate the score as well as other risk factors and determine if an abbreviated chemotherapy may be of benefit.  Return to clinic after surgery to discuss final pathology report and then determine if Oncotype DX testing will need to be sent.

## 2016-03-05 NOTE — Progress Notes (Signed)
[facility] Psychosocial Distress Screening Clinical Social Work  Clinical Social Work met with pt and her husband at [facility] clinic to introduce self, explain role of CSW/Support Team and to review the distress screening protocol.  The patient scored a 0 on the Psychosocial Distress Thermometer which indicates no distress. Pt some what teary when discussing her treatment plan. Clinical Social Worker further assessed for distress and other psychosocial needs. Pt reports she has used her faith to assist her with her diagnosis. She reports to have great support from her husband and family. Her sister is a cancer survivor and she has found her support helpful. Pt feels she has good strategies for coping currently. CSW reviewed common emotions/reactions to recent breast cancer diagnosis and coping techniques/resources to assist. Support Program calendar provided today. Pt and husband encouraged to reach out as needs arise.    ONCBCN DISTRESS SCREENING 03/05/2016  Screening Type Initial Screening  Distress experienced in past week (1-10) 0  Referral to support programs Yes    Clinical Social Worker follow up needed: No.  If yes, follow up plan:  Loren Racer, [place]  [facility] Phone: [phone] Fax: [phone]

## 2016-03-05 NOTE — Therapy (Signed)
[place], [place], [address] Phone: [phone]   Fax:  [phone]  Physical Therapy Evaluation  Patient Details  Name: Kayla Baker MRN: [id number] Date of Birth: 1936/02/27 Referring Provider: Dr. Fanny Skates  Encounter Date: 03/05/2016      PT End of Session - 03/05/16 1218    Visit Number 1   Number of Visits 12   Date for PT Re-Evaluation 04/02/16   PT Start Time 7741   PT Stop Time 1120  Also saw pt from 940-950 for a total of 47 minutes   PT Time Calculation (min) 37 min   Activity Tolerance Patient tolerated treatment well   Behavior During Therapy [facility] for tasks assessed/performed      Past Medical History  Diagnosis Date  . Hypertension   . Hyperlipidemia   . GERD (gastroesophageal reflux disease)   . Gout   . Allergy   . Diet-controlled type 2 diabetes mellitus ([place])   . Vitamin D deficiency   . Anemia   . Breast cancer of upper-inner quadrant of left female breast ([place]) 02/29/2016  . Arthritis     Past Surgical History  Procedure Laterality Date  . Cataract extraction, bilateral  2014    right eye 2/14; left eye 3/3  . Abdominal hysterectomy  1973  . Incontinence surgery  2006  . Knee arthroscopy      [id number] right  . Hand surgery  2012    left    There were no vitals filed for this visit.  Visit Diagnosis:  Carcinoma of left breast upper inner quadrant ([place]) - Plan: PT plan of care cert/re-cert  Abnormal posture - Plan: PT plan of care cert/re-cert  Pain in right shoulder - Plan: PT plan of care cert/re-cert  Stiffness of right shoulder, not elsewhere classified - Plan: PT plan of care cert/re-cert      Subjective Assessment - 03/05/16 1218    Subjective Patient reports she is here today for an assessment with her medical team for her newly diagnosed breast cancer.  She also reports right shoulder pain after moving mnay boxes in 8/16.   Pertinent History  Patient was diagnosed with left grade 2 invasive ductal carcinoma with DCIS breast cancer 02/15/16. It is located at 11:00 in the upper inner quadrant, is ER/PR positive and HER2 negative.  Her right shoulder pain began in 8/16 after her and her husband moved to a new home.  She has not done any treatment for this pain and has been hoping it will go away on its own.  She verbalized wanting to address it now since her breast cancer surgery on the other side may put more strain on her right shoulder.   Patient Stated Goals Decrease right shoulder pain, learn post op breast cancer ROM HEP, reduce lymphdeema risk.            [facility] PT Assessment - 03/05/16 0001    Assessment   Medical Diagnosis Right shoulder pain, left breast cancer   Referring Provider Dr. Fanny Skates   Onset Date/Surgical Date 02/15/16  07/02/15 for right shoulder pain; 02/15/16 left breast cancer   Hand Dominance Right   Prior Therapy none   Precautions   Precautions Other (comment)   Precaution Comments Active left breast cancer   Restrictions   Weight Bearing Restrictions No   Balance Screen   Has the patient fallen in the past 6 months No   Has the patient had a  decrease in activity level because of a fear of falling?  No   Is the patient reluctant to leave their home because of a fear of falling?  No   Home Ecologist residence   Living Arrangements Spouse/significant other   Available Help at Discharge Family   Prior Function   Level of [place] Retired   Leisure She walks once a week for 30 minutes   Cognition   Overall Cognitive Status Within Functional Limits for tasks assessed   Posture/Postural Control   Posture/Postural Control Postural limitations   Postural Limitations Forward head;Rounded Shoulders   ROM / Strength   AROM / PROM / Strength AROM;Strength   AROM   AROM Assessment Site Shoulder;Cervical   Right/Left Shoulder Right;Left    Right Shoulder Extension 35 Degrees   Right Shoulder Flexion 124 Degrees   Right Shoulder ABduction 145 Degrees   Right Shoulder Internal Rotation 71 Degrees   Right Shoulder External Rotation 65 Degrees  All right shoulder AROM was pain   Left Shoulder Extension 49 Degrees   Left Shoulder Flexion 128 Degrees   Left Shoulder ABduction 140 Degrees   Left Shoulder Internal Rotation 80 Degrees   Left Shoulder External Rotation 53 Degrees   Cervical Flexion WNL   Cervical Extension WNL   Cervical - Right Side Bend WNL   Cervical - Left Side Bend WNL   Cervical - Right Rotation WNL   Cervical - Left Rotation 25% limited   Strength   Overall Strength Comments Left shoulder WFL; right shoulder not tested due to pain   Palpation   Palpation comment Tender to palpation right acromion and superior scapula and scapular spine   Special Tests    Special Tests Rotator Cuff Impingement   Rotator Cuff Impingment tests Empty Can test;Speed's test  Both positive on right shoulder           LYMPHEDEMA/ONCOLOGY QUESTIONNAIRE - 03/05/16 1216    Type   Cancer Type Left breast cancer   Lymphedema Assessments   Lymphedema Assessments Upper extremities   Right Upper Extremity Lymphedema   10 cm Proximal to Olecranon Process 28.3 cm   Olecranon Process 25.8 cm   10 cm Proximal to Ulnar Styloid Process 22.2 cm   Just Proximal to Ulnar Styloid Process 15.4 cm   Across Hand at [employer] 18.9 cm   At [place] of 2nd Digit 6.3 cm   Left Upper Extremity Lymphedema   10 cm Proximal to Olecranon Process 27.4 cm   Olecranon Process 25 cm   10 cm Proximal to Ulnar Styloid Process 20.8 cm   Just Proximal to Ulnar Styloid Process 15.5 cm   Across Hand at [employer] 18.3 cm   At [place] of 2nd Digit 6.2 cm      Patient was instructed today in a home exercise program today for post op shoulder range of motion. These included active assist shoulder flexion in sitting, scapular retraction, wall walking  with shoulder abduction, and hands behind head external rotation.  She was encouraged to do these twice a day, holding 3 seconds and repeating 5 times when permitted by her physician.         PT Education - 03/05/16 1217    Education provided Yes   Education Details Lymphedema risk reduction and post op shoulder ROM HEP   Person(s) Educated Patient;Spouse   Methods Explanation;Demonstration;Handout   Comprehension Returned demonstration;Verbalized understanding  Breast Clinic Goals - 03/05/16 1224    Patient will be able to verbalize understanding of pertinent lymphedema risk reduction practices relevant to her diagnosis specifically related to skin care.   Time 1   Period Days   Status Achieved   Patient will be able to return demonstrate and/or verbalize understanding of the post-op home exercise program related to regaining shoulder range of motion.   Time 1   Period Days   Status Achieved   Patient will be able to verbalize understanding of the importance of attending the postoperative After Breast Cancer Class for further lymphedema risk reduction education and therapeutic exercise.   Time 1   Period Days   Status Achieved          Long Term Clinic Goals - 03/05/16 1225    CC Long Term Goal  #1   Title Patient will be independent with her home exercise program to improve right shoulder mobility and reduce pain.   Time 4   Period Weeks   Status New   CC Long Term Goal  #2   Title Patient will report >/= 25% less difficulty with donning jacket and reaching into the back seat of her car.   Time 4   Period Weeks   Status New   CC Long Term Goal  #3   Title Patient will report >/= 25% less pain with hooking bra behind her back and with daily activities.   Time 4   Period Weeks   Status New            Plan - 03/05/16 1219    Clinical Impression Statement Patient was diagnosed with left grade 2 invasive ductal carcinoma with DCIS breast cancer  02/15/16. It is located at 11:00 in the upper inner quadrant, is ER/PR positive and HER2 negative.  Her right shoulder pain began in 8/16 after her and her husband moved to a new home.  She has not done any treatment for this pain and has been hoping it will go away on its own.  She verbalized wanting to address it now since her breast cancer surgery on the other side may put more strain on her right shoulder.  She will benefit from physical therapy beginning immediately to address her right shoulder pain. This will enable improved outcomes following surgery. She is planning to have genetic testing, a left lumpectomy and sentinel node biopsy, Oncotype testing, radiation and anti-estrogen therapy.  She may benefit from post op PT to continue treating her right shoulder pain and also may need PT to reduce lympehdema risk and regain shoulder ROM and strength after surgery.   Pt will benefit from skilled therapeutic intervention in order to improve on the following deficits Decreased strength;Pain;Decreased knowledge of precautions;Impaired UE functional use;Decreased range of motion   Rehab Potential Excellent   Clinical Impairments Affecting Rehab Potential Active left breast cancer so there are certain modalities we can not do due to precautions.   PT Frequency 3x / week   PT Duration 4 weeks  Or until breast cancer surgery if that comes before 4 weeks   PT Treatment/Interventions ADLs/Self Care Home Management;Electrical Stimulation;Iontophoresis 27m/ml Dexamethasone;Moist Heat;Therapeutic exercise;Therapeutic activities;Patient/family education;Manual techniques;Dry needling;Passive range of motion   PT Next Visit Plan Begin iontophoresis if MD signs certification plan, modalities (except ultrasound prn), PROM, ROM exercises to open anterior shoulder   PT Home Exercise Plan Issued post op shoulder ROM HEP   Consulted and Agree with Plan of Care Patient;Family member/caregiver  Family Member Consulted  Husband     Patient will follow up at outpatient cancer rehab if needed following surgery.  If the patient requires physical therapy at that time, a specific plan will be dictated and sent to the referring physician for approval. The patient was educated today on appropriate basic range of motion exercises to begin post operatively and the importance of attending the After Breast Cancer class following surgery.  Patient was educated today on lymphedema risk reduction practices as it pertains to recommendations that will benefit the patient immediately following surgery.  She verbalized good understanding.        G-Codes - 2016/03/21 1229    Functional Assessment Tool Used Clinical Judgement   Functional Limitation Carrying, moving and handling objects   Carrying, Moving and Handling Objects Current Status [phone]) At least 40 percent but less than 60 percent impaired, limited or restricted   Carrying, Moving and Handling Objects Goal Status ([id number]) At least 1 percent but less than 20 percent impaired, limited or restricted       Problem List Patient Active Problem List   Diagnosis Date Noted  . Breast cancer of upper-inner quadrant of left female breast ([place]) 02/29/2016  . Medicare annual wellness visit, subsequent 06/19/2015  . Gout 12/08/2014  . Diabetic neuropathy, painful ([place]) 08/28/2014  . Medication management 08/27/2014  . Essential hypertension 11/10/2013  . Hyperlipidemia 11/10/2013  . T2_NIDDM w/CKD3 (GFR 45 ml/min) 11/10/2013  . Vitamin D deficiency 11/10/2013    Annia Friendly, PT 03/21/16 12:48 PM  [place], [place], [address] Phone: [phone]   Fax:  [phone]  Name: NEVAE PINNIX MRN: [id number] Date of Birth: 09-27-1936

## 2016-03-05 NOTE — Patient Instructions (Signed)

## 2016-03-05 NOTE — Progress Notes (Signed)
[place] NOTE  Patient Care Team: [name], MD as PCP - General Macarthur Critchley, OD as Referring Physician (Optometry) [name], MD as Consulting Physician (Dermatology) Latanya Maudlin, MD as Consulting Physician (Orthopedic Surgery) Inda Castle, MD as Consulting Physician (Gastroenterology)  CHIEF COMPLAINTS/PURPOSE OF CONSULTATION:  Newly diagnosed breast cancer  HISTORY OF PRESENTING ILLNESS:  Kayla Baker 80 y.o. female is here because of recent diagnosis of left breast cancer. She felt a palpable lump in the left breast which led to a mammogram and ultrasound and ultrasound-guided biopsy. The final pathology came back as invasive ductal carcinoma with DCIS. It was ER/PR positive HER-2 negative. She was present this morning in the multidisciplinary tumor board and she is here today at the multidisciplinary clinic to discuss a treatment plan. She has a hematoma at the site of the biopsy but otherwise does not have any other symptoms. She had been on oral estrogen replacement therapy since age 80.  I reviewed her records extensively and collaborated the history with the patient.  SUMMARY OF ONCOLOGIC HISTORY:   Breast cancer of upper-inner quadrant of left female breast ([place])   02/26/2016 Initial Diagnosis Left breast biopsy 11:00 position: invasive ductal carcinoma with DCIS, ER 90%, PR 10%, HER-2 negative, Ki-67 30%, grade 2, 2.2 cm palpable lesion T2 N0 stage II a clinical stage    In terms of breast cancer risk profile:  She menarched at early age of 89 and had a hysterectomy in 1973  She had no children She has not received birth control pills.  She was exposed to hormone replacement therapy for 22 years stopped March 2017 She has  family history of Breast/GYN/GI cancer Sister breast cancer age 86 another sister with lung cancer nephew with lung cancer uncle with pancreatic cancer age 70 another uncle esophageal cancer age 20  MEDICAL HISTORY:   Past Medical History  Diagnosis Date  . Hypertension   . Hyperlipidemia   . GERD (gastroesophageal reflux disease)   . Gout   . Allergy   . Diet-controlled type 2 diabetes mellitus ([place])   . Vitamin D deficiency   . Anemia   . Breast cancer of upper-inner quadrant of left female breast ([place]) 02/29/2016    SURGICAL HISTORY: Past Surgical History  Procedure Laterality Date  . Cataract extraction, bilateral  2014    right eye 2/14; left eye 3/3  . Abdominal hysterectomy  1973  . Incontinence surgery  2006  . Knee arthroscopy      [id number] right  . Hand surgery  2012    left    SOCIAL HISTORY: Social History   Social History  . Marital Status: Married    Spouse Name: N/A  . Number of Children: N/A  . Years of Education: N/A   Occupational History  . Not on file.   Social History Main Topics  . Smoking status: Never Smoker   . Smokeless tobacco: Never Used  . Alcohol Use: No  . Drug Use: No  . Sexual Activity: Not on file   Other Topics Concern  . 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 She is accompanied by her husband Djuana Littleton.  FAMILY HISTORY: Family History  Problem Relation Age of Onset  . Colon cancer Neg Hx   . Stomach cancer Neg Hx   . Stroke Mother   .  Diabetes Father   . Hypertension Sister   . Cancer Sister   . Cancer Sister     ALLERGIES:  is allergic to keflex; meloxicam; prednisone; premarin; and trovan.  MEDICATIONS:  Current Outpatient Prescriptions  Medication Sig Dispense Refill  . allopurinol (ZYLOPRIM) 300 MG tablet TAKE 1 TABLET BY MOUTH EVERY DAY 90 tablet PRN  . amitriptyline (ELAVIL) 25 MG tablet Take 1 to 2 tablets at bedtime for neuritis 180 tablet 1  . aspirin 81 MG tablet Take 81 mg by mouth daily.    Marland Kitchen atorvastatin (LIPITOR) 40 MG tablet TAKE 1 TABLET BY MOUTH EVERY DAY 90 tablet 1  .  cholecalciferol (VITAMIN D) 1000 UNITS tablet Take 1,000 Units by mouth daily.    Marland Kitchen CINNAMON PO Take 1,000 mg by mouth 2 (two) times daily.    . enalapril (VASOTEC) 20 MG tablet TAKE 1 TABLET BY MOUTH DAILY 90 tablet 1  . FREESTYLE LITE test strip USE ONE STRIP TO CHECK GLUCOSE ONCE DAILY. 100 each 0  . gabapentin (NEURONTIN) 100 MG capsule Take 1 to 3 capsules at bedtime for neuritis 90 capsule 5  . hydrochlorothiazide (HYDRODIURIL) 25 MG tablet TAKE 1 TABLET BY MOUTH DAILY 90 tablet 3  . Magnesium 250 MG TABS Take 250 mg by mouth 2 (two) times daily. Takes 2 tabs in am and 2 tabs in pm    . Multiple Vitamin (MULTIVITAMIN) tablet Take 1 tablet by mouth daily.    . Omega-3 Fatty Acids (FISH OIL) 1200 MG CAPS Take by mouth daily. Takes 2 capsules daily    . ranitidine (ZANTAC) 300 MG tablet Take twice a day as needed for Heartburn & Acid Indigestion 60 tablet 3  . vitamin B-12 (CYANOCOBALAMIN) 1000 MCG tablet Take 1,000 mcg by mouth daily. Takes 1 tablet every other day    . vitamin C (ASCORBIC ACID) 500 MG tablet Take 500 mg by mouth daily. Takes 2 tablets daily     No current facility-administered medications for this visit.    REVIEW OF SYSTEMS:   Constitutional: Denies fevers, chills or abnormal night sweats Eyes: Denies blurriness of vision, double vision or watery eyes Ears, nose, mouth, throat, and face: Denies mucositis or sore throat Respiratory: Denies cough, dyspnea or wheezes Cardiovascular: Denies palpitation, chest discomfort or lower extremity swelling Gastrointestinal:  Denies nausea, heartburn or change in bowel habits Skin: Denies abnormal skin rashes Lymphatics: Denies new lymphadenopathy or easy bruising Neurological:Denies numbness, tingling or new weaknesses Behavioral/Psych: Mood is stable, no new changes  Breast:  Left breast palpable lump with hematoma All other systems were reviewed with the patient and are negative.  PHYSICAL EXAMINATION: ECOG PERFORMANCE  STATUS: 1 - Symptomatic but completely ambulatory  Filed Vitals:   03/05/16 0840  BP: 119/71  Pulse: 96  Temp: 96 F (35.6 C)  Resp: 18   Filed Weights   03/05/16 0840  Weight: 149 lb 9.6 oz (67.858 kg)    GENERAL:alert, no distress and comfortable SKIN: skin color, texture, turgor are normal, no rashes or significant lesions EYES: normal, conjunctiva are pink and non-injected, sclera clear OROPHARYNX:no exudate, no erythema and lips, buccal mucosa, and tongue normal  NECK: supple, thyroid normal size, non-tender, without nodularity LYMPH:  no palpable lymphadenopathy in the cervical, axillary or inguinal LUNGS: clear to auscultation and percussion with normal breathing effort HEART: regular rate & rhythm and no murmurs and no lower extremity edema ABDOMEN:abdomen soft, non-tender and normal bowel sounds Musculoskeletal:no cyanosis of digits and no clubbing  PSYCH: alert & oriented x 3 with fluent speech NEURO: no focal motor/sensory deficits BREASTLeft breast palpable lump with hematoma at least 4 cm including the hematoma. No palpable axillary or supraclavicular lymphadenopathy (exam performed in the presence of a chaperone)   LABORATORY DATA:  I have reviewed the data as listed Lab Results  Component Value Date   WBC 6.0 03/05/2016   HGB 13.0 03/05/2016   HCT 38.5 03/05/2016   MCV 88.9 03/05/2016   PLT 172 03/05/2016   Lab Results  Component Value Date   NA 141 03/05/2016   K 4.0 03/05/2016   CL 104 12/21/2015   CO2 30* 03/05/2016    RADIOGRAPHIC STUDIES: I have personally reviewed the radiological reports and agreed with the findings in the report.  ASSESSMENT AND PLAN:  Breast cancer of upper-inner quadrant of left female breast ([place]) Left breast biopsy 11:00 position 02/18/2016: invasive ductal carcinoma with DCIS, ER 90%, PR 10%, HER-2 negative, Ki-67 30%, grade 2, 2.2 cm palpable lesion T2 N0 stage II a clinical stage  Pathology and radiology  counseling:Discussed with the patient, the details of pathology including the type of breast cancer,the clinical staging, the significance of ER, PR and HER-2/neu receptors and the implications for treatment. After reviewing the pathology in detail, we proceeded to discuss the different treatment options between surgery, radiation, chemotherapy, antiestrogen therapies.  Recommendations: 1. Breast conserving surgery followed by 2. Oncotype DX testing to determine if chemotherapy would be of any benefit followed by 3. Adjuvant radiation therapy followed by 4. Adjuvant antiestrogen therapy  Oncotype counseling: I discussed Oncotype DX test. I explained to the patient that this is a 21 gene panel to evaluate patient tumors DNA to calculate recurrence score. This would help determine whether patient has high risk or intermediate risk or low risk breast cancer. She understands that if her tumor was found to be high risk, she would benefit from systemic chemotherapy. If low risk, no need of chemotherapy. If she was found to be intermediate risk, we would need to evaluate the score as well as other risk factors and determine if an abbreviated chemotherapy may be of benefit.  Return to clinic after surgery to discuss final pathology report and then determine if Oncotype DX testing will need to be sent.   All questions were answered. The patient knows to call the clinic with any problems, questions or concerns.    Rulon Eisenmenger, MD 03/05/2016

## 2016-03-05 NOTE — Progress Notes (Signed)
Radiation Oncology         ([phone]) [phone] ________________________________  Name: Kayla Baker MRN: [id number]  Date: 03/05/2016  DOB: Nov 15, 1936  MW:[id number] DAVID, MD  Fanny Skates, MD     REFERRING PHYSICIAN: Fanny Skates, MD   DIAGNOSIS: The encounter diagnosis was Breast cancer of upper-inner quadrant of left female breast ([place]).   HISTORY OF PRESENT ILLNESS: Kayla Baker is a 80 y.o. female seen in the [facility] breast clinic who had a palpable breast mass and subsequently ultimately evaluated a 2.2 x 1.8 x 2.1 cm mass in the upper inner quadrant of the left breast. The axilla was negative for adenopathy. She did undero a biopsy on 02/15/16 revealing a ER 90%, PR 10%, HER2 negative, Ki 67 30% invasive ductal carcinoma with DCIS and questionable LVSI. She comes today to discuss the role of radiation therapy with Dr. Lisbeth Renshaw if she proceeds with breast conservation surgery.    PREVIOUS RADIATION THERAPY: No   PAST MEDICAL HISTORY:  Past Medical History  Diagnosis Date  . Hypertension   . Hyperlipidemia   . GERD (gastroesophageal reflux disease)   . Gout   . Allergy   . Diet-controlled type 2 diabetes mellitus ([place])   . Vitamin D deficiency   . Anemia   . Breast cancer of upper-inner quadrant of left female breast ([place]) 02/29/2016       PAST SURGICAL HISTORY: Past Surgical History  Procedure Laterality Date  . Cataract extraction, bilateral  2014    right eye 2/14; left eye 3/3  . Abdominal hysterectomy  1973  . Incontinence surgery  2006  . Knee arthroscopy      [id number] right  . Hand surgery  2012    left     FAMILY HISTORY:  Family History  Problem Relation Age of Onset  . Colon cancer Neg Hx   . Stomach cancer Neg Hx   . Stroke Mother   . Diabetes Father   . Hypertension Sister   . Cancer Sister   . Cancer Sister      SOCIAL HISTORY:  reports that she has never smoked. She has never used smokeless tobacco. She reports that she does not drink  alcohol or use illicit drugs. She is married and resides in [place]. She is retired from working in an Web designer role.   ALLERGIES: Keflex; Meloxicam; Prednisone; Premarin; and Trovan   MEDICATIONS:  Current Outpatient Prescriptions  Medication Sig Dispense Refill  . allopurinol (ZYLOPRIM) 300 MG tablet TAKE 1 TABLET BY MOUTH EVERY DAY 90 tablet PRN  . amitriptyline (ELAVIL) 25 MG tablet Take 1 to 2 tablets at bedtime for neuritis 180 tablet 1  . aspirin 81 MG tablet Take 81 mg by mouth daily.    Marland Kitchen atorvastatin (LIPITOR) 40 MG tablet TAKE 1 TABLET BY MOUTH EVERY DAY 90 tablet 1  . cholecalciferol (VITAMIN D) 1000 UNITS tablet Take 1,000 Units by mouth daily.    Marland Kitchen CINNAMON PO Take 1,000 mg by mouth 2 (two) times daily.    . enalapril (VASOTEC) 20 MG tablet TAKE 1 TABLET BY MOUTH DAILY 90 tablet 1  . estradiol (ESTRACE) 1 MG tablet Take 1 tablet (1 mg total) by mouth daily. 90 tablet 1  . FREESTYLE LITE test strip USE ONE STRIP TO CHECK GLUCOSE ONCE DAILY. 100 each 0  . gabapentin (NEURONTIN) 100 MG capsule Take 1 to 3 capsules at bedtime for neuritis 90 capsule 5  . hydrochlorothiazide (HYDRODIURIL) 25 MG tablet TAKE 1  TABLET BY MOUTH DAILY 90 tablet 3  . Magnesium 250 MG TABS Take 250 mg by mouth 2 (two) times daily. Takes 2 tabs in am and 2 tabs in pm    . Multiple Vitamin (MULTIVITAMIN) tablet Take 1 tablet by mouth daily.    . Omega-3 Fatty Acids (FISH OIL) 1200 MG CAPS Take by mouth daily. Takes 2 capsules daily    . ranitidine (ZANTAC) 300 MG tablet Take twice a day as needed for Heartburn & Acid Indigestion 60 tablet 3  . vitamin B-12 (CYANOCOBALAMIN) 1000 MCG tablet Take 1,000 mcg by mouth daily. Takes 1 tablet every other day    . vitamin C (ASCORBIC ACID) 500 MG tablet Take 500 mg by mouth daily. Takes 2 tablets daily     No current facility-administered medications for this encounter.     REVIEW OF SYSTEMS: On review of systems, the patient reports that she  is doing well overall. she denies any chest pain, shortness of breath, cough, fevers, chills, night sweats, unintended weight changes. She denies any bowel or bladder disturbances, and denies abdominal pain, nausea or vomiting. She denies any new musculoskeletal or joint aches or pains. A complete review of systems is obtained and is otherwise negative.     PHYSICAL EXAM:   Pain scale 0/10 In general this is a well appearing Caucasian female in no acute distress. She is alert and oriented x4 and appropriate throughout the examination. HEENT reveals that the patient is normocephalic, atraumatic. EOMs are intact. PERRLA. Skin is intact without any evidence of gross lesions. Cardiovascular exam reveals a regular rate and rhythm, no clicks rubs or murmurs are auscultated. Chest is clear to auscultation bilaterally. Lymphatic assessment is performed and does not reveal any adenopathy in the cervical, supraclavicular, axillary, or inguinal chains. Left breast is notable for fullness consistent with her mass along the upper inner quadrant. No skin changes, nipple discharge, or bleeding is noted. Right breast exam does not reveal any palpable abnormalities. No nipple bleeding or discharge is present. Abdomen has active bowel sounds in all quadrants and is intact. The abdomen is soft, non tender, non distended. Lower extremities are negative for pretibial pitting edema, deep calf tenderness, cyanosis or clubbing.   ECOG = 100  0 - Asymptomatic (Fully active, able to carry on all predisease activities without restriction)  1 - Symptomatic but completely ambulatory (Restricted in physically strenuous activity but ambulatory and able to carry out work of a light or sedentary nature. For example, light housework, office work)  2 - Symptomatic, <50% in bed during the day (Ambulatory and capable of all self care but unable to carry out any work activities. Up and about more than 50% of waking hours)  3 -  Symptomatic, >50% in bed, but not bedbound (Capable of only limited self-care, confined to bed or chair 50% or more of waking hours)  4 - Bedbound (Completely disabled. Cannot carry on any self-care. Totally confined to bed or chair)  5 - Death   Eustace Pen MM, Creech RH, Tormey DC, et al. [phone]). "Toxicity and response criteria of the [facility]". [place] Oncol. 5 (6): 649-55    LABORATORY DATA:  Lab Results  Component Value Date   WBC 6.0 03/05/2016   HGB 13.0 03/05/2016   HCT 38.5 03/05/2016   MCV 88.9 03/05/2016   PLT 172 03/05/2016   Lab Results  Component Value Date   NA 141 12/21/2015   K 4.3 12/21/2015  CL 104 12/21/2015   CO2 26 12/21/2015   Lab Results  Component Value Date   ALT 21 12/21/2015   AST 23 12/21/2015   ALKPHOS 81 12/21/2015   BILITOT 0.5 12/21/2015      RADIOGRAPHY: Mm Digital Diagnostic Unilat L  02/26/2016  CLINICAL DATA:  Evaluate clip placement following ultrasound-guided left breast biopsy. EXAM: DIAGNOSTIC LEFT MAMMOGRAM POST ULTRASOUND BIOPSY COMPARISON:  Previous exam(s). FINDINGS: Mammographic images were obtained following ultrasound guided biopsy of the 2.2 cm mass the 11 o'clock position of the left breast. The ribbon shaped clip is in satisfactory position. IMPRESSION: Satisfactory clip position following ultrasound-guided left breast biopsy. Final Assessment: Post Procedure Mammograms for Marker Placement Electronically Signed   By: Margarette Canada M.D.   On: 02/26/2016 14:26   Mm Radiologist Eval And Mgmt  02/27/2016  EXAM: ESTABLISHED PATIENT OFFICE VISIT - LEVEL II CHIEF COMPLAINT: The patient returns today for biopsy results. HISTORY OF PRESENT ILLNESS: Ultrasound-guided core biopsy was performed of a mass in the 11 o'clock location of the left breast on 02/26/2016. Overnight, the patient has done well. She reports no significant bleeding or bruising. Patient has not needed any pain medication. EXAM: Biopsy site is  evaluated. Steri-Strips and a Band-Aid are intact with only a small amount of dried blood at the biopsy site. PATHOLOGY: Grade 2 invasive and in situ ductal carcinoma in the 11 o'clock location of the left breast. ASSESSMENT AND PLAN: ASSESSMENT AND PLAN I met with the patientand her husband. The patient is referred to the [facility] on 03/05/2016. We discussed various treatments of breast cancer. Questions were answered. Patient also met with Terie Purser, R.N. for further discussion and patient education. Electronically Signed   By: Nolon Nations M.D.   On: 02/27/2016 14:58   US Breast Ltd Uni Left Inc Axilla  02/15/2016  CLINICAL DATA:  80 year old female with a palpable abnormality in the upper left breast. EXAM: DIGITAL DIAGNOSTIC BILATERAL MAMMOGRAM WITH 3D TOMOSYNTHESIS AND CAD BILATERAL BREAST ULTRASOUND COMPARISON:  Previous exam(s). ACR Breast Density Category c: The breast tissue is heterogeneously dense, which may obscure small masses. FINDINGS: There is an asymmetry in the upper-outer right breast which appears to resolves on the additional spot compression CC and MLO tomosynthesis with findings most suggestive of overlapping fibroglandular tissue and likely multiple in this location. This appearance is overall unchanged dating back on multiple prior mammograms. There is a large irregular spiculated mass in the upper central left breast which corresponds with the area of palpable concern measuring approximately 2.3 cm. Mammographic images were processed with CAD. Physical examination of the upper outer and upper right breast does not reveal any palpable masses. Physical examination of the upper left breast reveals a firm fixed nodule/area of thickening at the approximate 12 o'clock location. Targeted ultrasound of the left breast was performed demonstrating an ill-defined irregular hypoechoic shadowing mass at 11 o'clock 4 cm from the nipple measuring 2.2 x 1.8  x 2.1 cm. This corresponds with the mass seen at mammography. Several additional round in oval circumscribed near anechoic masses are seen adjacent to the dominant mass and felt to represent cysts. No lymphadenopathy seen in the left axilla. IMPRESSION: 1. Highly suspicious left breast mass at the approximate 12 o'clock location. 2.  Multiple cysts in the upper left breast. 3.  Innumerable right breast cysts. RECOMMENDATION: Ultrasound-guided biopsy of the highly suspicious mass in the left breast is recommended. This is scheduled for Tuesday 02/26/2016 at 1 p.m.  I have discussed the findings and recommendations with the patient. Results were also provided in writing at the conclusion of the visit. If applicable, a reminder letter will be sent to the patient regarding the next appointment. BI-RADS CATEGORY  5: Highly suggestive of malignancy. Electronically Signed   By: Everlean Alstrom M.D.   On: 02/15/2016 12:03   US Breast Ltd Uni Right Inc Axilla  02/15/2016  CLINICAL DATA:  80 year old female with a palpable abnormality in the upper left breast. EXAM: DIGITAL DIAGNOSTIC BILATERAL MAMMOGRAM WITH 3D TOMOSYNTHESIS AND CAD BILATERAL BREAST ULTRASOUND COMPARISON:  Previous exam(s). ACR Breast Density Category c: The breast tissue is heterogeneously dense, which may obscure small masses. FINDINGS: There is an asymmetry in the upper-outer right breast which appears to resolves on the additional spot compression CC and MLO tomosynthesis with findings most suggestive of overlapping fibroglandular tissue and likely multiple in this location. This appearance is overall unchanged dating back on multiple prior mammograms. There is a large irregular spiculated mass in the upper central left breast which corresponds with the area of palpable concern measuring approximately 2.3 cm. Mammographic images were processed with CAD. Physical examination of the upper outer and upper right breast does not reveal any palpable masses.  Physical examination of the upper left breast reveals a firm fixed nodule/area of thickening at the approximate 12 o'clock location. Targeted ultrasound of the left breast was performed demonstrating an ill-defined irregular hypoechoic shadowing mass at 11 o'clock 4 cm from the nipple measuring 2.2 x 1.8 x 2.1 cm. This corresponds with the mass seen at mammography. Several additional round in oval circumscribed near anechoic masses are seen adjacent to the dominant mass and felt to represent cysts. No lymphadenopathy seen in the left axilla. IMPRESSION: 1. Highly suspicious left breast mass at the approximate 12 o'clock location. 2.  Multiple cysts in the upper left breast. 3.  Innumerable right breast cysts. RECOMMENDATION: Ultrasound-guided biopsy of the highly suspicious mass in the left breast is recommended. This is scheduled for Tuesday 02/26/2016 at 1 p.m. I have discussed the findings and recommendations with the patient. Results were also provided in writing at the conclusion of the visit. If applicable, a reminder letter will be sent to the patient regarding the next appointment. BI-RADS CATEGORY  5: Highly suggestive of malignancy. Electronically Signed   By: Everlean Alstrom M.D.   On: 02/15/2016 12:03   Mm Diag Breast Tomo Bilateral  02/15/2016  CLINICAL DATA:  80 year old female with a palpable abnormality in the upper left breast. EXAM: DIGITAL DIAGNOSTIC BILATERAL MAMMOGRAM WITH 3D TOMOSYNTHESIS AND CAD BILATERAL BREAST ULTRASOUND COMPARISON:  Previous exam(s). ACR Breast Density Category c: The breast tissue is heterogeneously dense, which may obscure small masses. FINDINGS: There is an asymmetry in the upper-outer right breast which appears to resolves on the additional spot compression CC and MLO tomosynthesis with findings most suggestive of overlapping fibroglandular tissue and likely multiple in this location. This appearance is overall unchanged dating back on multiple prior mammograms.  There is a large irregular spiculated mass in the upper central left breast which corresponds with the area of palpable concern measuring approximately 2.3 cm. Mammographic images were processed with CAD. Physical examination of the upper outer and upper right breast does not reveal any palpable masses. Physical examination of the upper left breast reveals a firm fixed nodule/area of thickening at the approximate 12 o'clock location. Targeted ultrasound of the left breast was performed demonstrating an ill-defined irregular hypoechoic shadowing mass at 11  o'clock 4 cm from the nipple measuring 2.2 x 1.8 x 2.1 cm. This corresponds with the mass seen at mammography. Several additional round in oval circumscribed near anechoic masses are seen adjacent to the dominant mass and felt to represent cysts. No lymphadenopathy seen in the left axilla. IMPRESSION: 1. Highly suspicious left breast mass at the approximate 12 o'clock location. 2.  Multiple cysts in the upper left breast. 3.  Innumerable right breast cysts. RECOMMENDATION: Ultrasound-guided biopsy of the highly suspicious mass in the left breast is recommended. This is scheduled for Tuesday 02/26/2016 at 1 p.m. I have discussed the findings and recommendations with the patient. Results were also provided in writing at the conclusion of the visit. If applicable, a reminder letter will be sent to the patient regarding the next appointment. BI-RADS CATEGORY  5: Highly suggestive of malignancy. Electronically Signed   By: Everlean Alstrom M.D.   On: 02/15/2016 12:03   [place] Lt Breast Bx W Loc Dev 1st Lesion Img Bx Spec US Guide  02/26/2016  CLINICAL DATA:  80 year old female for tissue sampling of upper inner left breast mass. EXAM: ULTRASOUND GUIDED LEFT BREAST CORE NEEDLE BIOPSY COMPARISON:  Previous exam(s). FINDINGS: I met with the patient and we discussed the procedure of ultrasound-guided biopsy, including benefits and alternatives. We discussed the high  likelihood of a successful procedure. We discussed the risks of the procedure, including infection, bleeding, tissue injury, clip migration, and inadequate sampling. Informed written consent was given. The usual time-out protocol was performed immediately prior to the procedure. Using sterile technique and 1% Lidocaine as local anesthetic, under direct ultrasound visualization, a 14 gauge spring-loaded device was used to perform biopsy of the 2.2 cm mass at the 11 o'clock position of the left breast using a medial approach. At the conclusion of the procedure a ribbon shaped tissue marker clip was deployed into the biopsy cavity. Follow up 2 view mammogram was performed and dictated separately. IMPRESSION: Ultrasound guided biopsy of upper inner left breast mass. No apparent complications. Pathology will be followed. Electronically Signed   By: Margarette Canada M.D.   On: 02/26/2016 14:25       IMPRESSION: cT2, N0, M0 ER positive invasive ductal carcinoma of the left breast with DCIS.    PLAN: Dr. Lisbeth Renshaw discusses the pathology findings and reviews the nature of invasive and in situ disease. The consensus from the breast conference include breast conservation with lumpectomy with a sentinel lymph node evaluation with submission of pathology to oncotype testing. Provided no chemotherapy was indicated, we would follow the patient with external radiotherapy to the breast followed by antiestrogen therapy. Dr. Lisbeth Renshaw discusses that if chemotherapy were pursued however, we would postpone radiotherapy until this was completed.   Dr. Lisbeth Renshaw discusses the risks, benefits, short and long term side effects of radiotherapy. He discusses the possibility of using breath hold technique if necessary due to this being left sided disease to avoid untoward toxicity to the heart. He recommends a hypofractionated course of 20 fractions to the left breast. We will follow up with her pathology results, and anticipate that if she moves  forward with radiation without chemotherapy, that we would see her back about 2 weeks postoperatively, and begin radiation treatment about 4 weeks in total following surgery. She states agreement and understanding of the recommendations.     The above documentation reflects my direct findings during this shared patient visit. Please see the separate note by Dr. Lisbeth Renshaw on this date for the remainder of  the patient's plan of care.    Carola Rhine, PAC

## 2016-03-05 NOTE — Progress Notes (Signed)
Subjective:     Patient ID: Kayla Baker, female   DOB: 07-02-1936, 80 y.o.   MRN: [id number]  HPI   Review of Systems     Objective:   Physical Exam To assist the pt in identifying dietary strategies to gain lost wt.    Assessment:     Patient was seen today and found to be ready to go and accomapnied by her seemingly supportive husband. Pts labs A1C 6.3,  CO2 30, Creatinine 1.6, GFR 32. Pts ht 5'4'', 149 pounds, BMI 25.7. Pts medications: atorvastatin, cinnomon, magnesium, multi vitamin, vitamin B12, Vitamin C. Diabetic neuropathy. Pts husband had great questions and was actively listening while the pt was feeling over loaded with information.      Plan:     Dietitian educated the patient on implementing a plant based diet by incorporating more plant proteins, fruits, and vegetables. As a part of a healthy routine physical activity was discussed. A folder of evidence based information with a focus on a plant based diet and general nutrition during cancer was given to the patient.  The importance of legitimate, evidence based information was discussed and examples were given. As a part of the continuum of care the cancer dietitian's contact information was given to the patient in the event they would like to have a follow up appointment.

## 2016-03-06 ENCOUNTER — Ambulatory Visit: Payer: PPO | Attending: Oncology | Admitting: Physical Therapy

## 2016-03-06 ENCOUNTER — Encounter: Payer: Self-pay | Admitting: *Deleted

## 2016-03-06 ENCOUNTER — Encounter: Payer: Self-pay | Admitting: Physical Therapy

## 2016-03-06 DIAGNOSIS — M25511 Pain in right shoulder: Secondary | ICD-10-CM | POA: Insufficient documentation

## 2016-03-06 DIAGNOSIS — R293 Abnormal posture: Secondary | ICD-10-CM | POA: Diagnosis not present

## 2016-03-06 DIAGNOSIS — M25611 Stiffness of right shoulder, not elsewhere classified: Secondary | ICD-10-CM | POA: Diagnosis not present

## 2016-03-06 NOTE — Therapy (Signed)
[facility] [address], [place], [place], [address] Phone: [phone]   Fax:  [phone]  Physical Therapy Treatment  Patient Details  Name: Kayla Baker MRN: [id number] Date of Birth: June 01, 1936 Referring Provider: Dr. Fanny Skates  Encounter Date: 03/06/2016      PT End of Session - 03/06/16 0929    Visit Number 2   Number of Visits 10  Medicare   Date for PT Re-Evaluation 04/02/16   Authorization Type G-code on 10th visit   PT Start Time 0930   PT Stop Time 1010   PT Time Calculation (min) 40 min   Activity Tolerance Patient tolerated treatment well   Behavior During Therapy [facility] for tasks assessed/performed      Past Medical History  Diagnosis Date  . Hypertension   . Hyperlipidemia   . GERD (gastroesophageal reflux disease)   . Gout   . Allergy   . Diet-controlled type 2 diabetes mellitus ([place])   . Vitamin D deficiency   . Anemia   . Breast cancer of upper-inner quadrant of left female breast ([place]) 02/29/2016  . Arthritis     Past Surgical History  Procedure Laterality Date  . Cataract extraction, bilateral  2014    right eye 2/14; left eye 3/3  . Abdominal hysterectomy  1973  . Incontinence surgery  2006  . Knee arthroscopy      1638,4665 right  . Hand surgery  2012    left    There were no vitals filed for this visit.  Visit Diagnosis:  Pain in right shoulder      Subjective Assessment - 03/06/16 0933    Subjective I have pain when reaching to the side and reaching overhead.  When I put my right arm into a jacket and rotated my shoulder I have pain.    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 It is located at 11:00 in the upper inner quadrant, is ER/PR positive and HER2 negative.  Her right shoulder pain began in 8/16 after her and her husband moved to a new home.  She has not done any treatment for this pain  and has been hoping it will go away on its own.  She verbalized wanting to address it now since her breast cancer surgery on the other side may put more strain on her right shoulder.   Patient Stated Goals Decrease right shoulder pain, learn post op breast cancer ROM HEP, reduce lymphdeema risk.   Currently in Pain? Yes   Pain Score 3    Pain Location Shoulder   Pain Orientation Right   Pain Descriptors / Indicators Sharp   Pain Type Chronic pain   Pain Onset More than a month ago   Aggravating Factors  readhing behind back, donning jacket   Pain Relieving Factors rest   Effect of Pain on Daily Activities limits some right shoulder AROM activities.             [facility] PT Assessment - 03/06/16 0001    Observation/Other Assessments   Focus on Therapeutic Outcomes (FOTO)  41% limitation CK  goal is 34% limitation CJ           LYMPHEDEMA/ONCOLOGY QUESTIONNAIRE - 03/05/16 1216    Type   Cancer Type Left breast cancer   Lymphedema Assessments   Lymphedema Assessments Upper extremities   Right Upper Extremity Lymphedema   10 cm Proximal to Olecranon  Process 28.3 cm   Olecranon Process 25.8 cm   10 cm Proximal to Ulnar Styloid Process 22.2 cm   Just Proximal to Ulnar Styloid Process 15.4 cm   Across Hand at [employer] 18.9 cm   At [place] of 2nd Digit 6.3 cm   Left Upper Extremity Lymphedema   10 cm Proximal to Olecranon Process 27.4 cm   Olecranon Process 25 cm   10 cm Proximal to Ulnar Styloid Process 20.8 cm   Just Proximal to Ulnar Styloid Process 15.5 cm   Across Hand at [employer] 18.3 cm   At [place] of 2nd Digit 6.2 cm                  [facility] Adult PT Treatment/Exercise - 03/06/16 0001    Exercises   Exercises Shoulder   Shoulder Exercises: Supine   Horizontal ABduction AAROM;Strengthening;Right;15 reps   Horizontal ABduction Limitations while laying on foam roll while therapist does soft tissue work to pectoralis   Flexion  AAROM;Strengthening;Right;15 reps;Limitations   Flexion Limitations guided the scapula, lay on foam roll   ABduction AAROM;Strengthening   ABduction Limitations soft tissue work to right axilla, move scapula correctly   Shoulder Exercises: Pulleys   Flexion 2 minutes   ABduction 2 minutes   ABduction Limitations therapist manually guided scapula   Shoulder Exercises: ROM/Strengthening   Other ROM/Strengthening Exercises ladder for flexion 10x and abduction 10x   Modalities   Modalities Iontophoresis   Iontophoresis   Type of Iontophoresis Dexamethasone  #1   Location Right RTC   Dose 1 mL   Time 6 hour patch   Manual Therapy   Manual Therapy Joint mobilization;Soft tissue mobilization;Passive ROM   Joint Mobilization to right shoulder for anterior, posterior and distraction for right shoulder grade 3   Soft tissue mobilization to right shoulder for RTC, pectoralis, subscapularis,    Passive ROM all 4 motions                PT Education - 03/06/16 1013    Education provided Yes   Education Details pectoralis stretch, abduction wall walking, ionto precautions and contraindications, IR towel stretch   Person(s) Educated Patient   Methods Explanation;Demonstration;Verbal cues;Handout   Comprehension Verbalized understanding;Returned demonstration          PT Short Term Goals - 03/06/16 0925    PT SHORT TERM GOAL #1   Title Patietn will be able to verbalize understanding of pertinent lymphedema risk reduction practices relevant to her diagnosis specifically related to skin care.    Time 1   Period Days   Status Achieved   PT SHORT TERM GOAL #2   Title Patient will be able to return demonstrate and /or verbalize understanding of the paot-op home exercise program related to regaining shoulder range of motion   Time 1   Period Days   Status Achieved   PT SHORT TERM GOAL #3   Title Patient will be able to verbalize understanding of the importance of attending the  postoperative After Breat Cancer Class for further lymphedema risk reduction education and therapeutic exercise.    Time 1   Period Days   Status Achieved           PT Long Term Goals - 03/06/16 8921    PT LONG TERM GOAL #1   Title Patient will be independent with her home exercise program to improve right shoulder mobility and reduce pain.    Time 4   Period  Weeks   Status New   PT LONG TERM GOAL #2   Title Patient will report >/= 25% less difficulty with donning jacket and reaching into the back seat of her car   Time 4   Period Weeks   Status New   PT LONG TERM GOAL #3   Title Patient will report >/= 25% less pain with hooking bra behind her back and with daily activities   Time 4   Period Weeks   Status New         Breast Clinic Goals - March 08, 2016 1224    Patient will be able to verbalize understanding of pertinent lymphedema risk reduction practices relevant to her diagnosis specifically related to skin care.   Time 1   Period Days   Status Achieved   Patient will be able to return demonstrate and/or verbalize understanding of the post-op home exercise program related to regaining shoulder range of motion.   Time 1   Period Days   Status Achieved   Patient will be able to verbalize understanding of the importance of attending the postoperative After Breast Cancer Class for further lymphedema risk reduction education and therapeutic exercise.   Time 1   Period Days   Status Achieved          Long Term Clinic Goals - 2016/03/08 1225    CC Long Term Goal  #1   Title Patient will be independent with her home exercise program to improve right shoulder mobility and reduce pain.   Time 4   Period Weeks   Status New   CC Long Term Goal  #2   Title Patient will report >/= 25% less difficulty with donning jacket and reaching into the back seat of her car.   Time 4   Period Weeks   Status New   CC Long Term Goal  #3   Title Patient will report >/= 25% less pain with  hooking bra behind her back and with daily activities.   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  Patient has decreased mobiity of right shoulder, tight pectoralis, tight subscapularis, and decresaed mobility of upper thoracic.  Patient will benefit from physical therapy to reduce pain, increased mobility to prepare for her right to be in position for radiation.    Pt will benefit from skilled therapeutic intervention in order to improve on the following deficits Decreased strength;Pain;Decreased knowledge of precautions;Impaired UE functional use;Decreased range of motion   Rehab Potential Excellent   Clinical Impairments Affecting Rehab Potential Active left breast cancer so there are certain modalities we can not do due to precautions.   PT Frequency 3x / week   PT Duration 4 weeks  or until breast cancer surgery if that comes before 4 weeks   PT Treatment/Interventions ADLs/Self Care Home Management;Electrical Stimulation;Iontophoresis 34m/ml Dexamethasone;Moist Heat;Therapeutic exercise;Therapeutic activities;Patient/family education;Manual techniques;Dry needling;Passive range of motion   PT Next Visit Plan  iontophoresis , modalities (except ultrasound prn), PROM, ROM exercises to open anterior shoulder   PT Home Exercise Plan progress as needed   Consulted and Agree with Plan of Care Patient          G-Codes - 0Apr 08, 20171229    Functional Assessment Tool Used Clinical Judgement   Functional Limitation Carrying, moving  and handling objects   Carrying, Moving and Handling Objects Current Status [phone]) At least 40 percent but less than 60 percent impaired, limited or restricted   Carrying, Moving and Handling Objects Goal Status ([id number]) At least 1 percent but less than 20 percent impaired, limited or restricted       Problem List Patient Active Problem List   Diagnosis Date Noted  . Breast cancer of upper-inner quadrant of left female breast ([place]) 02/29/2016  . Medicare annual wellness visit, subsequent 06/19/2015  . Gout 12/08/2014  . Diabetic neuropathy, painful ([place]) 08/28/2014  . Medication management 08/27/2014  . Essential hypertension 11/10/2013  . Hyperlipidemia 11/10/2013  . T2_NIDDM w/CKD3 (GFR 45 ml/min) 11/10/2013  . Vitamin D deficiency 11/10/2013    Earlie Counts, PT 03/06/2016 1:30 PM   [facility] [address] [place], [place], [address] Phone: [phone]   Fax:  [phone]  Name: ANASTASIYA GOWIN MRN: [id number] Date of Birth: July 28, 1936

## 2016-03-06 NOTE — Patient Instructions (Signed)
Internal Rotation (Passive)    Bring right hand behind back, using using a towel to pull across back . Hold _10___ seconds. Repeat __5__ times. Do _2___ sessions per day.  Copyright  VHI. All rights reserved.  CHEST: Doorway, Bilateral - Standing    Standing in doorway, place hands on wall with elbows bent at shoulder height. Lean forward. Hold _15__ seconds. _2__ reps per set, __1_ sets per day, _2__ days per week No pain just stretch.  Keep chin tucked in.  Copyright  VHI. All rights reserved.    Stand to the side with your affected shoulder near a wall. Slide your arm up to the side, teasing the end range of movement. Slowly come back down and repeat. 10 times 2 times per day.  IONTOPHORESIS PATIENT PRECAUTIONS & CONTRAINDICATIONS:  . Redness under one or both electrodes can occur.  This characterized by a uniform redness that usually disappears within 12 hours of treatment. . Small pinhead size blisters may result in response to the drug.  Contact your physician if the problem persists more than 24 hours. . On rare occasions, iontophoresis therapy can result in temporary skin reactions such as rash, inflammation, irritation or burns.  The skin reactions may be the result of individual sensitivity to the ionic solution used, the condition of the skin at the start of treatment, reaction to the materials in the electrodes, allergies or sensitivity to dexamethasone, or a poor connection between the patch and your skin.  Discontinue using iontophoresis if you have any of these reactions and report to your therapist. . Remove the Patch or electrodes if you have any undue sensation of pain or burning during the treatment and report discomfort to your therapist. . Tell your Therapist if you have had known adverse reactions to the application of electrical current. . If using the Patch, the LED light will turn off when treatment is complete and the patch can be removed.  Approximate  treatment time is 1-3 hours.  Remove the patch when light goes off or after 6 hours. . The Patch can be worn during normal activity, however excessive motion where the electrodes have been placed can cause poor contact between the skin and the electrode or uneven electrical current resulting in greater risk of skin irritation. Marland Kitchen Keep out of the reach of children.   . DO NOT use if you have a cardiac pacemaker or any other electrically sensitive implanted device. . DO NOT use if you have a known sensitivity to dexamethasone. . DO NOT use during Magnetic Resonance Imaging (MRI). . DO NOT use over broken or compromised skin (e.g. sunburn, cuts, or acne) due to the increased risk of skin reaction. . DO NOT SHAVE over the area to be treated:  To establish good contact between the Patch and the skin, excessive hair may be clipped. . DO NOT place the Patch or electrodes on or over your eyes, directly over your heart, or brain. . DO NOT reuse the Patch or electrodes as this may cause burns to occur.  [facility] [address], [place], [place] [address] Phone # [phone] Fax [phone]

## 2016-03-07 ENCOUNTER — Other Ambulatory Visit: Payer: Self-pay | Admitting: General Surgery

## 2016-03-07 DIAGNOSIS — C50212 Malignant neoplasm of upper-inner quadrant of left female breast: Secondary | ICD-10-CM

## 2016-03-10 ENCOUNTER — Other Ambulatory Visit: Payer: Self-pay | Admitting: Internal Medicine

## 2016-03-10 ENCOUNTER — Telehealth: Payer: Self-pay | Admitting: *Deleted

## 2016-03-10 ENCOUNTER — Telehealth: Payer: Self-pay | Admitting: Hematology and Oncology

## 2016-03-10 NOTE — Telephone Encounter (Signed)
Left message for pt to return call to office to inform her of appt with [facility] 4/27

## 2016-03-10 NOTE — Telephone Encounter (Signed)
Left message for a return phone call to follow up from [facility] 03/05/16.

## 2016-03-11 ENCOUNTER — Ambulatory Visit: Payer: PPO | Admitting: Physical Therapy

## 2016-03-11 ENCOUNTER — Encounter: Payer: Self-pay | Admitting: Physical Therapy

## 2016-03-11 ENCOUNTER — Telehealth: Payer: Self-pay | Admitting: *Deleted

## 2016-03-11 DIAGNOSIS — M25511 Pain in right shoulder: Secondary | ICD-10-CM | POA: Diagnosis not present

## 2016-03-11 DIAGNOSIS — M25611 Stiffness of right shoulder, not elsewhere classified: Secondary | ICD-10-CM

## 2016-03-11 NOTE — Telephone Encounter (Signed)
Received call back from patient from [facility]. She states she is doing well with no complaints.  Encouraged her to call with any needs or concerns.

## 2016-03-11 NOTE — Therapy (Signed)
[facility] [address] [place], [place], [address] Phone: [phone]   Fax:  [phone]  Physical Therapy Treatment  Patient Details  Name: Kayla Baker MRN: [id number] Date of Birth: [birth year] Referring Provider: Dr. Fanny Skates  Encounter Date: 03/11/2016      PT End of Session - 03/11/16 1611    Visit Number 3   Number of Visits 10   Date for PT Re-Evaluation 04/02/16   Authorization Type G-code on 10th visit   PT Start Time 1530   PT Stop Time 1610   PT Time Calculation (min) 40 min   Activity Tolerance Patient tolerated treatment well   Behavior During Therapy [facility] for tasks assessed/performed      Past Medical History  Diagnosis Date  . Hypertension   . Hyperlipidemia   . GERD (gastroesophageal reflux disease)   . Gout   . Allergy   . Diet-controlled type 2 diabetes mellitus ([place])   . Vitamin D deficiency   . Anemia   . Breast cancer of upper-inner quadrant of left female breast ([place]) 02/29/2016  . Arthritis     Past Surgical History  Procedure Laterality Date  . Cataract extraction, bilateral  2014    right eye 2/14; left eye 3/3  . Abdominal hysterectomy  1973  . Incontinence surgery  2006  . Knee arthroscopy      2505,3976 right  . Hand surgery  2012    left    There were no vitals filed for this visit.      Subjective Assessment - 03/11/16 1538    Subjective I am having sugery on 03/18/2016 to have a lumpectomy. I felt good after last visit. The patch helped my arm.    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 It is located at 11:00 in the upper inner quadrant, is ER/PR positive and HER2 negative.  Her right shoulder pain began in 8/16 after her and her husband moved to a new home.  She has not done any treatment for this pain and has been hoping it will go away on its own.  She verbalized wanting to address  it now since her breast cancer surgery on the other side may put more strain on her right shoulder.   Patient Stated Goals Decrease right shoulder pain, learn post op breast cancer ROM HEP, reduce lymphdeema risk.   Currently in Pain? Yes   Pain Score 3    Pain Location Shoulder   Pain Orientation Right   Pain Descriptors / Indicators Sharp   Pain Type Chronic pain   Pain Onset More than a month ago   Pain Frequency Intermittent   Aggravating Factors  reaching behind her back and putting jacket on.    Pain Relieving Factors rest   Multiple Pain Sites No                         [facility] Adult PT Treatment/Exercise - 03/11/16 0001    Shoulder Exercises: Pulleys   Flexion 2 minutes   ABduction 2 minutes   Modalities   Modalities Iontophoresis   Iontophoresis   Type of Iontophoresis Dexamethasone  #2   Location Right RTC   Dose 1 mL   Time 6 hour patch   Manual Therapy   Manual Therapy Joint mobilization;Soft tissue mobilization;Passive ROM;Scapular mobilization   Joint Mobilization to right shoulder for anterior, posterior  and distraction for right shoulder grade 3   Soft tissue mobilization right upper trap, right subscapularis, right pectoralis, right supraspinatus   Scapular Mobilization lift up the border of the scapula to distract form rib cage,    Passive ROM all 4 motions; used a towel roll for reaching behind her back to distract with movement                PT Education - 03/11/16 1610    Education provided Yes   Education Details lay with hands behind head and press elbows into the mat   Person(s) Educated Patient   Methods Explanation;Demonstration   Comprehension Verbalized understanding;Returned demonstration          PT Short Term Goals - 03/06/16 0925    PT SHORT TERM GOAL #1   Title Patietn will be able to verbalize understanding of pertinent lymphedema risk reduction practices relevant to her diagnosis specifically related to skin  care.    Time 1   Period Days   Status Achieved   PT SHORT TERM GOAL #2   Title Patient will be able to return demonstrate and /or verbalize understanding of the paot-op home exercise program related to regaining shoulder range of motion   Time 1   Period Days   Status Achieved   PT SHORT TERM GOAL #3   Title Patient will be able to verbalize understanding of the importance of attending the postoperative After Breat Cancer Class for further lymphedema risk reduction education and therapeutic exercise.    Time 1   Period Days   Status Achieved           PT Long Term Goals - 03/06/16 4742    PT LONG TERM GOAL #1   Title Patient will be independent with her home exercise program to improve right shoulder mobility and reduce pain.    Time 4   Period Weeks   Status New   PT LONG TERM GOAL #2   Title Patient will report >/= 25% less difficulty with donning jacket and reaching into the back seat of her car   Time 4   Period Weeks   Status New   PT LONG TERM GOAL #3   Title Patient will report >/= 25% less pain with hooking bra behind her back and with daily activities   Time [place] [facility] Goals - 03/05/16 1224    Patient will be able to verbalize understanding of pertinent lymphedema risk reduction practices relevant to her diagnosis specifically related to skin care.   Time 1   Period Days   Status Achieved   Patient will be able to return demonstrate and/or verbalize understanding of the post-op home exercise program related to regaining shoulder range of motion.   Time 1   Period Days   Status Achieved   Patient will be able to verbalize understanding of the importance of attending the postoperative After Breast Cancer Class for further lymphedema risk reduction education and therapeutic exercise.   Time 1   Period Days   Status Achieved          Long Term Clinic Goals - 03/05/16 1225    CC Long Term Goal  #1   Title  Patient will be independent with her home exercise program to improve right shoulder mobility and reduce pain.   Time 4   Period Weeks   Status New  CC Long Term Goal  #2   Title Patient will report >/= 25% less difficulty with donning jacket and reaching into the back seat of her car.   Time 4   Period Weeks   Status New   CC Long Term Goal  #3   Title Patient will report >/= 25% less pain with hooking bra behind her back and with daily activities.   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  Patient is having a lumpectomy to the left breast on 02/16/2016.  Patient  right scapula does not move as well for IR.  Patient has tight righ tshoulder muscularture making it difficult to get to the endrange of her motion  due to pain.  Patient will be discharged at next visit so she can have surgery.    Rehab Potential Excellent   Clinical Impairments Affecting Rehab Potential Active left breast cancer so there are certain modalities we can not do due to precautions.   PT Frequency 3x / week   PT Duration 4 weeks  or until she has surgery   PT Treatment/Interventions ADLs/Self Care Home Management;Electrical Stimulation;Iontophoresis 20m/ml Dexamethasone;Moist Heat;Therapeutic exercise;Therapeutic activities;Patient/family education;Manual techniques;Dry needling;Passive range of motion   PT Next Visit Plan  iontophoresis , , PROM, ROM exercises to open anterior shoulder; Discharge, g-code   PT Home Exercise Plan progress as needed   Consulted and Agree with Plan of Care Patient   Family Member Consulted Husband      Patient will benefit from skilled therapeutic intervention in order to improve the following deficits and impairments:  Decreased strength, Pain, Decreased knowledge of precautions, Impaired UE functional use,  Decreased range of motion  Visit Diagnosis: Pain in right shoulder  Stiffness of right shoulder, not elsewhere classified     Problem List Patient Active Problem List   Diagnosis Date Noted  . Breast cancer of upper-inner quadrant of left female breast ([place] 02/29/2016  . Medicare annual wellness visit, subsequent 06/19/2015  . Gout 12/08/2014  . Diabetic neuropathy, painful ([place] 08/28/2014  . Medication management 08/27/2014  . Essential hypertension 11/10/2013  . Hyperlipidemia 11/10/2013  . T2_NIDDM w/CKD3 (GFR 45 ml/min) 11/10/2013  . Vitamin D deficiency 11/10/2013    CEarlie Counts PT 03/11/2016 4:16 PM    [facility] [address] [place] [address]Phone: [phone]  Fax:  [phone] Name: Kayla ALLAIREMRN: [id number] of Birth: [birth year]

## 2016-03-13 ENCOUNTER — Encounter (HOSPITAL_COMMUNITY)
Admission: RE | Admit: 2016-03-13 | Discharge: 2016-03-13 | Disposition: A | Discharge status: Home / Self Care | Payer: PPO | Source: Ambulatory Visit | Attending: General Surgery | Admitting: General Surgery

## 2016-03-13 ENCOUNTER — Encounter (HOSPITAL_COMMUNITY): Payer: Self-pay

## 2016-03-13 ENCOUNTER — Ambulatory Visit: Payer: PPO

## 2016-03-13 DIAGNOSIS — Z7982 Long term (current) use of aspirin: Secondary | ICD-10-CM | POA: Insufficient documentation

## 2016-03-13 DIAGNOSIS — I129 Hypertensive chronic kidney disease with stage 1 through stage 4 chronic kidney disease, or unspecified chronic kidney disease: Secondary | ICD-10-CM | POA: Diagnosis not present

## 2016-03-13 DIAGNOSIS — E119 Type 2 diabetes mellitus without complications: Secondary | ICD-10-CM | POA: Diagnosis not present

## 2016-03-13 DIAGNOSIS — D649 Anemia, unspecified: Secondary | ICD-10-CM | POA: Diagnosis not present

## 2016-03-13 DIAGNOSIS — E785 Hyperlipidemia, unspecified: Secondary | ICD-10-CM | POA: Insufficient documentation

## 2016-03-13 DIAGNOSIS — N183 Chronic kidney disease, stage 3 (moderate): Secondary | ICD-10-CM | POA: Diagnosis not present

## 2016-03-13 DIAGNOSIS — Z01812 Encounter for preprocedural laboratory examination: Secondary | ICD-10-CM | POA: Insufficient documentation

## 2016-03-13 DIAGNOSIS — M25511 Pain in right shoulder: Secondary | ICD-10-CM | POA: Diagnosis not present

## 2016-03-13 DIAGNOSIS — Z01818 Encounter for other preprocedural examination: Secondary | ICD-10-CM | POA: Diagnosis not present

## 2016-03-13 DIAGNOSIS — K219 Gastro-esophageal reflux disease without esophagitis: Secondary | ICD-10-CM | POA: Insufficient documentation

## 2016-03-13 DIAGNOSIS — Z79899 Other long term (current) drug therapy: Secondary | ICD-10-CM | POA: Diagnosis not present

## 2016-03-13 DIAGNOSIS — C50912 Malignant neoplasm of unspecified site of left female breast: Secondary | ICD-10-CM | POA: Diagnosis not present

## 2016-03-13 DIAGNOSIS — M25611 Stiffness of right shoulder, not elsewhere classified: Secondary | ICD-10-CM

## 2016-03-13 DIAGNOSIS — R293 Abnormal posture: Secondary | ICD-10-CM

## 2016-03-13 HISTORY — DX: Personal history of colonic polyps: Z86.010

## 2016-03-13 HISTORY — DX: Personal history of other infectious and parasitic diseases: Z86.19

## 2016-03-13 HISTORY — DX: Personal history of colon polyps, unspecified: Z86.0100

## 2016-03-13 LAB — COMPREHENSIVE METABOLIC PANEL
ALBUMIN: 3.9 g/dL (ref 3.5–5.0)
ALK PHOS: 79 U/L (ref 38–126)
ALT: 32 U/L (ref 14–54)
AST: 35 U/L (ref 15–41)
Anion gap: 11 (ref 5–15)
BILIRUBIN TOTAL: 0.6 mg/dL (ref 0.3–1.2)
BUN: 21 mg/dL — AB (ref 6–20)
CALCIUM: 10 mg/dL (ref 8.9–10.3)
CO2: 30 mmol/L (ref 22–32)
CREATININE: 1.7 mg/dL — AB (ref 0.44–1.00)
Chloride: 102 mmol/L (ref 101–111)
GFR calc Af Amer: 32 mL/min — ABNORMAL LOW (ref >60)
GFR calc non Af Amer: 28 mL/min — ABNORMAL LOW (ref >60)
GLUCOSE: 178 mg/dL — AB (ref 65–99)
Potassium: 3.8 mmol/L (ref 3.5–5.1)
Sodium: 143 mmol/L (ref 135–145)
TOTAL PROTEIN: 6.6 g/dL (ref 6.5–8.1)

## 2016-03-13 LAB — CBC WITH DIFFERENTIAL/PLATELET
BASOS ABS: 0 10*3/uL (ref 0.0–0.1)
BASOS PCT: 1 %
Eosinophils Absolute: 0.3 10*3/uL (ref 0.0–0.7)
Eosinophils Relative: 5 %
HEMATOCRIT: 36.7 % (ref 36.0–46.0)
HEMOGLOBIN: 12.1 g/dL (ref 12.0–15.0)
LYMPHS PCT: 39 %
Lymphs Abs: 2.3 10*3/uL (ref 0.7–4.0)
MCH: 29.4 pg (ref 26.0–34.0)
MCHC: 33 g/dL (ref 30.0–36.0)
MCV: 89.3 fL (ref 78.0–100.0)
Monocytes Absolute: 0.4 10*3/uL (ref 0.1–1.0)
Monocytes Relative: 7 %
NEUTROS ABS: 2.9 10*3/uL (ref 1.7–7.7)
NEUTROS PCT: 48 %
Platelets: 186 10*3/uL (ref 150–400)
RBC: 4.11 MIL/uL (ref 3.87–5.11)
RDW: 13.6 % (ref 11.5–15.5)
WBC: 5.8 10*3/uL (ref 4.0–10.5)

## 2016-03-13 LAB — GLUCOSE, CAPILLARY: GLUCOSE-CAPILLARY: 154 mg/dL — AB (ref 65–99)

## 2016-03-13 MED ORDER — CHLORHEXIDINE GLUCONATE 4 % EX LIQD: 1.0000 "application " | Freq: Once | CUTANEOUS | Status: DC

## 2016-03-13 NOTE — Therapy (Signed)
[facility] [address] [place], [place], [address] Phone: [phone]   Fax:  [phone]  Physical Therapy Treatment  Patient Details  Name: Kayla Baker MRN: [id number] Date of Birth: 03-21-1936 Referring Provider: Dr. Fanny Skates  Encounter Date: 03/13/2016      PT End of Session - 03/13/16 1123    Visit Number 4   PT Start Time 1102   PT Stop Time 1140   PT Time Calculation (min) 38 min   Activity Tolerance Patient tolerated treatment well   Behavior During Therapy [facility] for tasks assessed/performed      Past Medical History  Diagnosis Date  . Hypertension   . Hyperlipidemia   . GERD (gastroesophageal reflux disease)   . Gout   . Allergy   . Diet-controlled type 2 diabetes mellitus ([place])   . Vitamin D deficiency   . Anemia   . Breast cancer of upper-inner quadrant of left female breast ([place]) 02/29/2016  . Arthritis     Past Surgical History  Procedure Laterality Date  . Cataract extraction, bilateral  2014    right eye 2/14; left eye 3/3  . Abdominal hysterectomy  1973  . Incontinence surgery  2006  . Knee arthroscopy      2482,5003 right  . Hand surgery  2012    left    There were no vitals filed for this visit.      Subjective Assessment - 03/13/16 1108    Subjective Shoulder only hurts when I move it a certain way.  Having surgery 03/18/16 for Lt lumpectomy.     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 It is located at 11:00 in the upper inner quadrant, is ER/PR positive and HER2 negative.  Her right shoulder pain began in 8/16 after her and her husband moved to a new home.  She has not done any treatment for this pain and has been hoping it will go away on its own.  She verbalized wanting to address it now since her breast cancer surgery on the other side may put more strain on her right shoulder.   Currently in  Pain? Yes   Pain Score 3    Pain Location Shoulder   Pain Orientation Right   Pain Onset More than a month ago   Pain Frequency Intermittent   Aggravating Factors  reaching behind to put on jacket   Pain Relieving Factors rest            [facility] PT Assessment - 03/13/16 0001    Assessment   Medical Diagnosis Right shoulder pain, left breast cancer   Onset Date/Surgical Date 02/15/16  07/02/15 for right shoulder pain; 02/15/16 left breast cancer   Precautions   Precautions Other (comment)   Precaution Comments Active left breast cancer   Observation/Other Assessments   Focus on Therapeutic Outcomes (FOTO)  44% limitation   AROM   AROM Assessment Site Shoulder   Right/Left Shoulder Right   Right Shoulder Flexion 125 Degrees   Right Shoulder ABduction 116 Degrees   Right Shoulder Internal Rotation --  to T8   Right Shoulder External Rotation --  to T2                     [facility] Adult PT Treatment/Exercise - 03/13/16 0001    Shoulder Exercises: Standing   Flexion 10 reps;AAROM;Right   Flexion Limitations using finger ladder  ABduction AAROM;Right;10 reps   ABduction Limitations using finger ladder   Shoulder Exercises: Pulleys   Flexion 2 minutes   ABduction 2 minutes   Modalities   Modalities Iontophoresis   Iontophoresis   Type of Iontophoresis Dexamethasone  #3   Location Right RTC   Dose 1 mL   Time 6 hour patch   Manual Therapy   Manual Therapy Joint mobilization;Soft tissue mobilization;Passive ROM;Scapular mobilization   Joint Mobilization Rt glenohumeral joint into flexion, IR and ER                PT Education - 03/13/16 1123    Education provided Yes   Education Details ionto instructions   Person(s) Educated Patient   Methods Explanation;Handout   Comprehension Verbalized understanding          PT Short Term Goals - 03/06/16 0925    PT SHORT TERM GOAL #1   Title Patietn will be able to verbalize understanding of pertinent  lymphedema risk reduction practices relevant to her diagnosis specifically related to skin care.    Time 1   Period Days   Status Achieved   PT SHORT TERM GOAL #2   Title Patient will be able to return demonstrate and /or verbalize understanding of the paot-op home exercise program related to regaining shoulder range of motion   Time 1   Period Days   Status Achieved   PT SHORT TERM GOAL #3   Title Patient will be able to verbalize understanding of the importance of attending the postoperative After Breat Cancer Class for further lymphedema risk reduction education and therapeutic exercise.    Time 1   Period Days   Status Achieved           PT Long Term Goals - 03/13/16 1110    PT LONG TERM GOAL #1   Title Patient will be independent with her home exercise program to improve right shoulder mobility and reduce pain.    Status Achieved   PT LONG TERM GOAL #2   Title Patient will report >/= 25% less difficulty with donning jacket and reaching into the back seat of her car   Status Achieved   PT LONG TERM GOAL #3   Title Patient will report >/= 25% less pain with hooking bra behind her back and with daily activities   Status Achieved         Breast Clinic Goals - 03/05/16 1224    Patient will be able to verbalize understanding of pertinent lymphedema risk reduction practices relevant to her diagnosis specifically related to skin care.   Time 1   Period Days   Status Achieved   Patient will be able to return demonstrate and/or verbalize understanding of the post-op home exercise program related to regaining shoulder range of motion.   Time 1   Period Days   Status Achieved   Patient will be able to verbalize understanding of the importance of attending the postoperative After Breast Cancer Class for further lymphedema risk reduction education and therapeutic exercise.   Time 1   Period Days   Status Achieved          Long Term Clinic Goals - 03/05/16 1225    CC Long  Term Goal  #1   Title Patient will be independent with her home exercise program to improve right shoulder mobility and reduce pain.   Time 4   Period Weeks   Status New   CC Long Term Goal  #2  Title Patient will report >/= 25% less difficulty with donning jacket and reaching into the back seat of her car.   Time 4   Period Weeks   Status New   CC Long Term Goal  #3   Title Patient will report >/= 25% less pain with hooking bra behind her back and with daily activities.   Time 4   Period Weeks   Status New            Plan - 03-22-2016 1111    Clinical Impression Statement Pt reports 50% overall improvement in Rt shoulder pain with reaching behind the back to fasten bra and putting on jacket.  Pt has HEP in place for Rt shoulder AROM progression.  Pt with continued Rt shoulder AROM and scapular mobiltiy limitations.  Pt will D/C to HEP as she is having lumpectomy surgery next week.     PT Next Visit Plan D/C PT to HEP   Consulted and Agree with Plan of Care Patient      Patient will benefit from skilled therapeutic intervention in order to improve the following deficits and impairments:     Visit Diagnosis: Pain in right shoulder  Stiffness of right shoulder, not elsewhere classified  Abnormal posture       G-Codes - 03-22-16 1113    Functional Assessment Tool Used FOTO: 44% limitation   Functional Limitation Carrying, moving and handling objects   Carrying, Moving and Handling Objects Goal Status (M4680) At least 1 percent but less than 20 percent impaired, limited or restricted   Carrying, Moving and Handling Objects Discharge Status [phone]) At least 40 percent but less than 60 percent impaired, limited or restricted      Problem List Patient Active Problem List   Diagnosis Date Noted  . Breast cancer of upper-inner quadrant of left female breast ([place]) 02/29/2016  . Medicare annual wellness visit, subsequent 06/19/2015  . Gout 12/08/2014  . Diabetic neuropathy,  painful ([place]) 08/28/2014  . Medication management 08/27/2014  . Essential hypertension 11/10/2013  . Hyperlipidemia 11/10/2013  . T2_NIDDM w/CKD3 (GFR 45 ml/min) 11/10/2013  . Vitamin D deficiency 11/10/2013    PHYSICAL THERAPY DISCHARGE SUMMARY  Visits from Start of Care: 4   Current functional level related to goals / functional outcomes: See above for current status.     Remaining deficits: See above.   Education / Equipment: HEP Plan: Patient agrees to discharge.  Patient goals were met. Patient is being discharged due to meeting the stated rehab goals.  ?????   Sigurd Sos, PT 03-22-16 11:43 AM  [facility] [address], [place], [place], [address] Phone: [phone]   Fax:  [phone]  Name: EMALYNN CLEWIS MRN: [id number] Date of Birth: 1936-01-03

## 2016-03-13 NOTE — Progress Notes (Signed)
Cardiologist denies  Medical Md is Dr.William Melford Aase  Echo denies  Stress test denies  Heart cath denies  EKG in epic from 12-21-15  CXR denies in past yr

## 2016-03-13 NOTE — Patient Instructions (Signed)

## 2016-03-13 NOTE — Pre-Procedure Instructions (Signed)
Genene M Gahan  03/13/2016      [facility] - [place], [place] - [address] AT [address] [place] [address] Phone: [phone] Fax: [phone] - [place], [place] - [place] [address] [place] [address] Phone: [phone] Fax: [phone]  [facility] [place], [place] [address] [place] [place] [address] Phone: [phone] Fax: [phone]    Your procedure is scheduled on Tues, April 18 @ 11:30 AM  Report to [facility] Admitting at 9:30 AM  Call this number if you have problems the morning of surgery:  [phone]   Remember:  Do not eat food or drink liquids after midnight.  Take these medicines the morning of surgery with A SIP OF WATER Allopurinol(Zyloprim) and Zantac(Ranitidine-if needed)               Stop taking your Aspirin along with any Vitamins or Herbal Medications. No Goody's,BC's,Aleve,Advil,Motrin,Ibuprofen,or Fish Oil.    Do not wear jewelry.  Do not wear lotions, powders, or colognes.    Do not shave 48 hours prior to surgery.    Do not bring valuables to the hospital.  [facility] is not responsible for any belongings or valuables.  Contacts, dentures or bridgework may not be worn into surgery.  Leave your suitcase in the car.  After surgery it may be brought to your room.  For patients admitted to the hospital, discharge time will be determined by your treatment team.  Patients discharged the day of surgery will not be allowed to drive home.    [facility]  Before surgery, you can play an important role.  Because skin is not sterile, your skin needs to be as free of germs as possible.  You can reduce the number of germs on you skin by washing with CHG (chlorahexidine gluconate) soap before surgery.  CHG is an antiseptic cleaner which kills germs and  bonds with the skin to continue killing germs even after washing.  Please DO NOT use if you have an allergy to CHG or antibacterial soaps.  If your skin becomes reddened/irritated stop using the CHG and inform your nurse when you arrive at Short Stay.  Do not shave (including legs and underarms) for at least 48 hours prior to the first CHG shower.  You may shave your face.  Please follow these instructions carefully:   1.  Shower with CHG Soap the night before surgery and the                                morning of Surgery.  2.  If you choose to wash your hair, wash your hair first as usual with your       normal shampoo.  3.  After you shampoo, rinse your hair and body thoroughly to remove the                      Shampoo.  4.  Use CHG as you would any other liquid soap.  You can apply chg directly       to the skin and wash gently with scrungie or a clean washcloth.  5.  Apply the CHG Soap to your body ONLY FROM THE NECK DOWN.        Do not use on  open wounds or open sores.  Avoid contact with your eyes,       ears, mouth and genitals (private parts).  Wash genitals (private parts)       with your normal soap.  6.  Wash thoroughly, paying special attention to the area where your surgery        will be performed.  7.  Thoroughly rinse your body with warm water from the neck down.  8.  DO NOT shower/wash with your normal soap after using and rinsing off       the CHG Soap.  9.  Pat yourself dry with a clean towel.            10.  Wear clean pajamas.            11.  Place clean sheets on your bed the night of your first shower and do not        sleep with pets.  Day of Surgery  Do not apply any lotions/deoderants the morning of surgery.  Please wear clean clothes to the hospital/surgery center.  ons:    Please read over the following fact sheets that you were given. Pain Booklet, Coughing and Deep Breathing and Surgical Site Infection Prevention

## 2016-03-14 LAB — HEMOGLOBIN A1C
HEMOGLOBIN A1C: 6.4 % — AB (ref 4.8–5.6)
Mean Plasma Glucose: 137 mg/dL

## 2016-03-14 NOTE — Progress Notes (Signed)
Anesthesia Chart Review:  Pt is a 80 year old female scheduled for radioactive seed guided partial L mastectomy with axillary sentinel lymph node biopsy and blue dye injection on 03/18/2016 with Dr. Dalbert Batman.   PCP is Dr. [name]  PMH includes:  HTN, DM (diet controlled), hyperlipidemia, anemia, CKD (stage 3), post-op N/V, GERD. Never smoker. BMI 26  Medications include: ASA, lipitor, enalapril, hctz, zantac.   Preoperative labs reviewed.   - HgbA1c 6.4, glucose 178 - Cr 1.7, BUN 21. Usual baseline Cr around 1.4. PCP's office is closed today. Will recheck BMET DOS  EKG 12/21/15: NSR  If renal function acceptable DOS, I anticipate pt can proceed as scheduled.   Willeen Cass, FNP-BC [facility] Short Stay Surgical Center/Anesthesiology Phone: [phone] 03/14/2016 12:48 PM

## 2016-03-14 NOTE — H&P (Signed)
Kayla Baker  Location: [facility] Patient #: [id number] DOB: 05-Jun-1936 Undefined / Language: Cleophus Baker / Race: White Female        History of Present Illness      This is a 80 year old Caucasian female in excellent health. She is referred by Dr. Nolon Nations at the breast center of [facility] for evaluation and treatment of a palpable cancer in the left breast at the 11:00 position. Dr. [name] is her PCP. She wa evaluated in the [facility] recently by [name], Dr. Lisbeth Renshaw, and me. We have discussed her case at breast conference.   I have coordinated her care plan with [name] and Dr. Lisbeth Renshaw.          The patient felt a lump in her left breast in January. Painless. No skin change or nipple discharge. Recent imaging studies show a 2.2 cm mass in the left breast at the 11:00 position, 4 cm from the nipple, upper inner quadrant. Multiple cysts were seen. Image guided biopsy showed grade 2 invasive ductal carcinoma and DCIS. Estrogen receptor 90%, progesterone receptor 10%, T6-T7 30%, HER-2 negative. She has had some bruising and may be a small hematoma in this area but feels fine. She is strongly motivated for lumpectomy.      Past history reveals total abdominal hysterectomy without BSO through Pfannenstiel incision. Hypertension. Gout. Nondistended. Diabetes mellitus. She may have chronic kidney disease. Hyperlipidemia. Diabetic peripheral neuropathy. Sister had breast cancer at age 63. No other breast cancer the family. Sister had lung cancer. Cousin had pancreatic cancer. She is married. No children. Her husband is here with her. Denies tobacco or alcohol.      We talked about the difference between mastectomy and lumpectomy with radiation therapy, sentinel node biopsy. We talked about immediate and delayed plastic surgical reconstruction. She knows that lumpectomy is easier to recover from and is appropriate but that because of the size  of the tumor that she is going to lose some volume and probably will have some symmetry issues that may or may not require a symmetry procedure on the opposite side a later date. She knows that there is at least 10% risk of reoperation for positive margins. She is comfortable with all of this and wants to go ahead and try for lumpectomy.  She will be scheduled for left breast lumpectomy with radioactive seed localization, Injection of blue dye, left axillary sentinel node biopsy. I discussed the indications, details, techniques, and numerous risk of the surgery. She's aware the risk of cosmetic deformity requiring revision, bleeding, infection, nerve graft damage with chronic pain or numbness, reoperation for positive margins or positive nodes. She understands all these issues. All of her questions are answered. She agrees with this plan. Her husband was present throughout the encounter.   Other Problems  Diabetes Mellitus Gastroesophageal Reflux Disease High blood pressure Hypercholesterolemia Lump In Breast Melanoma  Past Surgical History  Cataract Surgery Bilateral. Hysterectomy (not due to cancer) - Partial Knee Surgery Right.  Diagnostic Studies History  Colonoscopy 1-5 years ago Mammogram 1-3 years ago Pap Smear >5 years ago  Medication History  No Current Medications Medications Reconciled  Social History Alcohol use Occasional alcohol use. Caffeine use Coffee. No drug use Tobacco use Never smoker.  Family History  Breast Cancer Sister. Cancer Family Members In General. Cerebrovascular Accident Mother. Malignant Neoplasm Of Pancreas Family Members In General. Respiratory Condition Family Members In General, Sister. Seizure disorder Family Members In General. Thyroid problems Sister.  Pregnancy /  Birth History  Age at menarche 13 years. Age of menopause <47 Gravida 0 Para 0    Review of System General Not Present- Appetite  Loss, Chills, Fatigue, Fever, Night Sweats, Weight Gain and Weight Loss. Skin Not Present- Change in Wart/Mole, Dryness, Hives, Jaundice, New Lesions, Non-Healing Wounds, Rash and Ulcer. HEENT Present- Wears glasses/contact lenses. Not Present- Earache, Hearing Loss, Hoarseness, Nose Bleed, Oral Ulcers, Ringing in the Ears, Seasonal Allergies, Sinus Pain, Sore Throat, Visual Disturbances and Yellow Eyes. Respiratory Not Present- Bloody sputum, Chronic Cough, Difficulty Breathing, Snoring and Wheezing. Breast Present- Breast Mass. Not Present- Breast Pain, Nipple Discharge and Skin Changes. Cardiovascular Not Present- Chest Pain, Difficulty Breathing Lying Down, Leg Cramps, Palpitations, Rapid Heart Rate, Shortness of Breath and Swelling of Extremities. Gastrointestinal Not Present- Abdominal Pain, Bloating, Bloody Stool, Change in Bowel Habits, Chronic diarrhea, Constipation, Difficulty Swallowing, Excessive gas, Gets full quickly at meals, Hemorrhoids, Indigestion, Nausea, Rectal Pain and Vomiting. Female Genitourinary Not Present- Frequency, Nocturia, Painful Urination, Pelvic Pain and Urgency. Musculoskeletal Present- Muscle Pain. Not Present- Back Pain, Joint Pain, Joint Stiffness, Muscle Weakness and Swelling of Extremities. Neurological Not Present- Decreased Memory, Fainting, Headaches, Numbness, Seizures, Tingling, Tremor, Trouble walking and Weakness. Psychiatric Not Present- Anxiety, Bipolar, Change in Sleep Pattern, Depression, Fearful and Frequent crying. Endocrine Not Present- Cold Intolerance, Excessive Hunger, Hair Changes, Heat Intolerance, Hot flashes and New Diabetes. Hematology Not Present- Easy Bruising, Excessive bleeding, Gland problems, HIV and Persistent Infections.   Physical Exam  General Mental Status-Alert. General Appearance-Consistent with stated age. Hydration-Well hydrated. Voice-Normal.  Head and Neck Head-normocephalic, atraumatic with no lesions or  palpable masses. Trachea-midline. Thyroid Gland Characteristics - normal size and consistency.  Eye Eyeball - Bilateral-Extraocular movements intact. Sclera/Conjunctiva - Bilateral-No scleral icterus.  Chest and Lung Exam Chest and lung exam reveals -quiet, even and easy respiratory effort with no use of accessory muscles and on auscultation, normal breath sounds, no adventitious sounds and normal vocal resonance. Inspection Chest Wall - Normal. Back - normal.  Breast Note: In the left breast at the 11:30 position there is a 3.5 cm mobile mass and some ecchymoses. I cannot tell whether this is all tumor or whether it may be some hematoma making it seem larger than the imaging studies. Nipple and areolar complex look normal. This seems separate from the areola. No other mass or skin change in either breast. No axillary adenopathy.   Cardiovascular Cardiovascular examination reveals -normal heart sounds, regular rate and rhythm with no murmurs and normal pedal pulses bilaterally.  Abdomen Inspection Inspection of the abdomen reveals - No Hernias. Palpation/Percussion Palpation and Percussion of the abdomen reveal - Soft, Non Tender, No Rebound tenderness, No Rigidity (guarding) and No hepatosplenomegaly. Auscultation Auscultation of the abdomen reveals - Bowel sounds normal. Note: Well-healed Pfannenstiel incision   Neurologic Neurologic evaluation reveals -alert and oriented x 3 with no impairment of recent or remote memory. Mental Status-Normal.  Musculoskeletal Normal Exam - Left-Upper Extremity Strength Normal and Lower Extremity Strength Normal. Normal Exam - Right-Upper Extremity Strength Normal and Lower Extremity Strength Normal.  Lymphatic Head & Neck  General Head & Neck Lymphatics: Bilateral - Description - Normal. Axillary  General Axillary Region: Bilateral - Description - Normal. Tenderness - Non Tender. Femoral & Inguinal  Generalized  Femoral & Inguinal Lymphatics: Bilateral - Description - Normal. Tenderness - Non Tender.    Assessment & Plan  BREAST CANCER OF UPPER-INNER QUADRANT OF LEFT FEMALE BREAST (C50.212)  You recently felt a lump in your left  breast at the 11:00 position. Imaging studies and biopsies show a 2.2 cm mass at the 11:00 position of the left breast, and biopsy shows invasive ductal carcinoma. This tumor is estrogen and progesterone receptor positive, HER-2 negative.  We have discussed options for surgical management including lumpectomy, mastectomy, radiation therapy, sentinel node biopsy, immediate and delayed plastic surgical reconstruction. You state that you are strongly motivated for breast conservation. You will be scheduled for left breast lumpectomy with radioactive seed localization and left axillary sentinel node biopsy.  We have discussed the indications, techniques, and numerous risk of this surgery. You are aware of the potential cosmetic deformity with flattening of the breast and volume loss, possible need for symmetry procedure in the future.  Dr. Darrel Hoover office will call you tomorrow to begin discussion about the scheduling. After surgery you will follow-up with [name] and Dr. Lisbeth Renshaw to decide about other treatments  Please read the printed information that we gave you.   TYPE 2 DIABETES MELLITUS WITHOUT COMPLICATION, WITHOUT LONG-TERM CURRENT USE OF INSULIN (E11.9) HYPERTENSION, BENIGN (I10) HISTORY OF ABDOMINAL HYSTERECTOMY (Z90.710) Impression: without SBO DIABETIC PERIPHERAL NEUROPATHY (E11.42) GOUT, ARTHRITIS (M10.9) FAMILY HISTORY OF BREAST CANCER (Z80.3) Impression: Sister age 45. No other relatives   Edsel Petrin. Dalbert Batman, M.D., [facility] General and Minimally invasive Surgery Breast and Colorectal Surgery Office:   [phone] Pager:   [phone]

## 2016-03-15 ENCOUNTER — Other Ambulatory Visit: Payer: Self-pay | Admitting: Internal Medicine

## 2016-03-17 ENCOUNTER — Ambulatory Visit
Admission: RE | Admit: 2016-03-17 | Discharge: 2016-03-17 | Disposition: A | Discharge status: Home / Self Care | Payer: PPO | Source: Ambulatory Visit | Attending: General Surgery | Admitting: General Surgery

## 2016-03-17 DIAGNOSIS — C50912 Malignant neoplasm of unspecified site of left female breast: Secondary | ICD-10-CM | POA: Diagnosis not present

## 2016-03-17 DIAGNOSIS — C50212 Malignant neoplasm of upper-inner quadrant of left female breast: Secondary | ICD-10-CM

## 2016-03-17 MED ORDER — VANCOMYCIN HCL IN DEXTROSE 1-5 GM/200ML-% IV SOLN
1000.0000 mg | INTRAVENOUS | Status: AC
  Administered 2016-03-18: 1000 mg via INTRAVENOUS
  Filled 2016-03-17: qty 200

## 2016-03-18 ENCOUNTER — Ambulatory Visit
Admission: RE | Admit: 2016-03-18 | Discharge: 2016-03-18 | Disposition: A | Discharge status: Home / Self Care | Payer: PPO | Source: Ambulatory Visit | Attending: General Surgery | Admitting: General Surgery

## 2016-03-18 ENCOUNTER — Ambulatory Visit (HOSPITAL_COMMUNITY): Payer: PPO | Admitting: Emergency Medicine

## 2016-03-18 ENCOUNTER — Ambulatory Visit (HOSPITAL_COMMUNITY)
Admission: RE | Admit: 2016-03-18 | Discharge: 2016-03-18 | Disposition: A | Discharge status: Home / Self Care | Payer: PPO | Source: Ambulatory Visit | Attending: General Surgery | Admitting: General Surgery

## 2016-03-18 ENCOUNTER — Encounter (HOSPITAL_COMMUNITY)
Admission: RE | Disposition: A | Discharge status: Home / Self Care | Payer: Self-pay | Source: Ambulatory Visit | Attending: General Surgery

## 2016-03-18 ENCOUNTER — Encounter: Payer: Self-pay | Admitting: Physical Therapy

## 2016-03-18 ENCOUNTER — Ambulatory Visit (HOSPITAL_COMMUNITY): Payer: PPO | Admitting: Certified Registered Nurse Anesthetist

## 2016-03-18 DIAGNOSIS — C50212 Malignant neoplasm of upper-inner quadrant of left female breast: Secondary | ICD-10-CM | POA: Diagnosis present

## 2016-03-18 DIAGNOSIS — C50912 Malignant neoplasm of unspecified site of left female breast: Secondary | ICD-10-CM | POA: Diagnosis not present

## 2016-03-18 DIAGNOSIS — K219 Gastro-esophageal reflux disease without esophagitis: Secondary | ICD-10-CM | POA: Insufficient documentation

## 2016-03-18 DIAGNOSIS — E119 Type 2 diabetes mellitus without complications: Secondary | ICD-10-CM | POA: Diagnosis present

## 2016-03-18 DIAGNOSIS — Z79899 Other long term (current) drug therapy: Secondary | ICD-10-CM | POA: Insufficient documentation

## 2016-03-18 DIAGNOSIS — R928 Other abnormal and inconclusive findings on diagnostic imaging of breast: Secondary | ICD-10-CM | POA: Diagnosis not present

## 2016-03-18 DIAGNOSIS — I10 Essential (primary) hypertension: Secondary | ICD-10-CM | POA: Diagnosis not present

## 2016-03-18 DIAGNOSIS — Z803 Family history of malignant neoplasm of breast: Secondary | ICD-10-CM | POA: Insufficient documentation

## 2016-03-18 DIAGNOSIS — E1142 Type 2 diabetes mellitus with diabetic polyneuropathy: Secondary | ICD-10-CM | POA: Insufficient documentation

## 2016-03-18 DIAGNOSIS — Z853 Personal history of malignant neoplasm of breast: Secondary | ICD-10-CM | POA: Diagnosis present

## 2016-03-18 DIAGNOSIS — M109 Gout, unspecified: Secondary | ICD-10-CM | POA: Diagnosis not present

## 2016-03-18 DIAGNOSIS — Z801 Family history of malignant neoplasm of trachea, bronchus and lung: Secondary | ICD-10-CM | POA: Diagnosis not present

## 2016-03-18 DIAGNOSIS — E785 Hyperlipidemia, unspecified: Secondary | ICD-10-CM | POA: Diagnosis not present

## 2016-03-18 DIAGNOSIS — C50112 Malignant neoplasm of central portion of left female breast: Secondary | ICD-10-CM | POA: Diagnosis not present

## 2016-03-18 DIAGNOSIS — Z85820 Personal history of malignant melanoma of skin: Secondary | ICD-10-CM | POA: Diagnosis not present

## 2016-03-18 DIAGNOSIS — Z7982 Long term (current) use of aspirin: Secondary | ICD-10-CM | POA: Diagnosis not present

## 2016-03-18 DIAGNOSIS — D0582 Other specified type of carcinoma in situ of left breast: Secondary | ICD-10-CM | POA: Diagnosis not present

## 2016-03-18 DIAGNOSIS — G8918 Other acute postprocedural pain: Secondary | ICD-10-CM | POA: Diagnosis not present

## 2016-03-18 DIAGNOSIS — E1140 Type 2 diabetes mellitus with diabetic neuropathy, unspecified: Secondary | ICD-10-CM | POA: Diagnosis present

## 2016-03-18 DIAGNOSIS — E1129 Type 2 diabetes mellitus with other diabetic kidney complication: Secondary | ICD-10-CM | POA: Diagnosis present

## 2016-03-18 HISTORY — PX: BREAST LUMPECTOMY: SHX2

## 2016-03-18 HISTORY — PX: RADIOACTIVE SEED GUIDED PARTIAL MASTECTOMY WITH AXILLARY SENTINEL LYMPH NODE BIOPSY: SHX6520

## 2016-03-18 LAB — BASIC METABOLIC PANEL
ANION GAP: 10 (ref 5–15)
BUN: 19 mg/dL (ref 6–20)
CALCIUM: 9.8 mg/dL (ref 8.9–10.3)
CO2: 24 mmol/L (ref 22–32)
CREATININE: 1.5 mg/dL — AB (ref 0.44–1.00)
Chloride: 108 mmol/L (ref 101–111)
GFR, EST AFRICAN AMERICAN: 38 mL/min — AB (ref >60)
GFR, EST NON AFRICAN AMERICAN: 32 mL/min — AB (ref >60)
Glucose, Bld: 105 mg/dL — ABNORMAL HIGH (ref 65–99)
Potassium: 4 mmol/L (ref 3.5–5.1)
SODIUM: 142 mmol/L (ref 135–145)

## 2016-03-18 LAB — GLUCOSE, CAPILLARY: Glucose-Capillary: 107 mg/dL — ABNORMAL HIGH (ref 65–99)

## 2016-03-18 SURGERY — RADIOACTIVE SEED GUIDED PARTIAL MASTECTOMY WITH AXILLARY SENTINEL LYMPH NODE BIOPSY
Anesthesia: General | Site: Breast | Laterality: Left

## 2016-03-18 MED ORDER — FENTANYL CITRATE (PF) 100 MCG/2ML IJ SOLN
25.0000 ug | INTRAMUSCULAR | Status: DC | PRN
  Administered 2016-03-18 (×3): 25 ug via INTRAVENOUS

## 2016-03-18 MED ORDER — BUPIVACAINE-EPINEPHRINE 0.25% -1:200000 IJ SOLN
INTRAMUSCULAR | Status: DC | PRN
  Administered 2016-03-18: 11 mL

## 2016-03-18 MED ORDER — METHYLENE BLUE 0.5 % INJ SOLN
INTRAVENOUS | Status: AC
  Filled 2016-03-18: qty 10

## 2016-03-18 MED ORDER — SODIUM CHLORIDE 0.9 % IV SOLN: 250.0000 mL | INTRAVENOUS | Status: DC | PRN

## 2016-03-18 MED ORDER — LACTATED RINGERS IV SOLN
INTRAVENOUS | Status: DC
  Administered 2016-03-18: 10:00:00 via INTRAVENOUS

## 2016-03-18 MED ORDER — FENTANYL CITRATE (PF) 100 MCG/2ML IJ SOLN
INTRAMUSCULAR | Status: AC
  Administered 2016-03-18: 50 ug
  Filled 2016-03-18: qty 2

## 2016-03-18 MED ORDER — OXYCODONE HCL 5 MG PO TABS: 5.0000 mg | ORAL_TABLET | ORAL | Status: DC | PRN

## 2016-03-18 MED ORDER — FENTANYL CITRATE (PF) 250 MCG/5ML IJ SOLN
INTRAMUSCULAR | Status: AC
  Filled 2016-03-18: qty 5

## 2016-03-18 MED ORDER — LIDOCAINE HCL (CARDIAC) 20 MG/ML IV SOLN
INTRAVENOUS | Status: AC
  Filled 2016-03-18: qty 10

## 2016-03-18 MED ORDER — FENTANYL CITRATE (PF) 100 MCG/2ML IJ SOLN: 25.0000 ug | INTRAMUSCULAR | Status: DC | PRN

## 2016-03-18 MED ORDER — PROPOFOL 10 MG/ML IV BOLUS
INTRAVENOUS | Status: DC | PRN
  Administered 2016-03-18: 110 mg via INTRAVENOUS

## 2016-03-18 MED ORDER — ACETAMINOPHEN 650 MG RE SUPP: 650.0000 mg | RECTAL | Status: DC | PRN

## 2016-03-18 MED ORDER — FENTANYL CITRATE (PF) 100 MCG/2ML IJ SOLN
INTRAMUSCULAR | Status: AC
  Filled 2016-03-18: qty 2

## 2016-03-18 MED ORDER — PHENYLEPHRINE HCL 10 MG/ML IJ SOLN
INTRAMUSCULAR | Status: DC | PRN
  Administered 2016-03-18 (×2): 40 ug via INTRAVENOUS

## 2016-03-18 MED ORDER — FENTANYL CITRATE (PF) 100 MCG/2ML IJ SOLN
INTRAMUSCULAR | Status: DC | PRN
  Administered 2016-03-18 (×5): 25 ug via INTRAVENOUS

## 2016-03-18 MED ORDER — ONDANSETRON HCL 4 MG/2ML IJ SOLN
INTRAMUSCULAR | Status: AC
  Filled 2016-03-18: qty 4

## 2016-03-18 MED ORDER — MIDAZOLAM HCL 2 MG/2ML IJ SOLN
INTRAMUSCULAR | Status: AC
  Administered 2016-03-18: 2 mg
  Filled 2016-03-18: qty 2

## 2016-03-18 MED ORDER — SODIUM CHLORIDE 0.9 % IV SOLN: INTRAVENOUS | Status: DC

## 2016-03-18 MED ORDER — ACETAMINOPHEN 325 MG PO TABS: 650.0000 mg | ORAL_TABLET | ORAL | Status: DC | PRN

## 2016-03-18 MED ORDER — LIDOCAINE HCL (CARDIAC) 20 MG/ML IV SOLN
INTRAVENOUS | Status: DC | PRN
  Administered 2016-03-18: 100 mg via INTRAVENOUS

## 2016-03-18 MED ORDER — SODIUM CHLORIDE 0.9% FLUSH: 3.0000 mL | INTRAVENOUS | Status: DC | PRN

## 2016-03-18 MED ORDER — HYDROCODONE-ACETAMINOPHEN 5-325 MG PO TABS: 1.0000 | ORAL_TABLET | Freq: Four times a day (QID) | ORAL | Status: DC | PRN

## 2016-03-18 MED ORDER — LACTATED RINGERS IV SOLN
INTRAVENOUS | Status: DC | PRN
  Administered 2016-03-18 (×2): via INTRAVENOUS

## 2016-03-18 MED ORDER — MIDAZOLAM HCL 2 MG/2ML IJ SOLN
INTRAMUSCULAR | Status: AC
  Filled 2016-03-18: qty 2

## 2016-03-18 MED ORDER — SODIUM CHLORIDE 0.9% FLUSH: 3.0000 mL | Freq: Two times a day (BID) | INTRAVENOUS | Status: DC

## 2016-03-18 MED ORDER — 0.9 % SODIUM CHLORIDE (POUR BTL) OPTIME
TOPICAL | Status: DC | PRN
  Administered 2016-03-18: 1000 mL

## 2016-03-18 MED ORDER — ONDANSETRON HCL 4 MG/2ML IJ SOLN
INTRAMUSCULAR | Status: DC | PRN
  Administered 2016-03-18: 4 mg via INTRAVENOUS

## 2016-03-18 MED ORDER — SODIUM CHLORIDE 0.9 % IJ SOLN
INTRAVENOUS | Status: DC | PRN
  Administered 2016-03-18: 2 mL via INTRADERMAL

## 2016-03-18 MED ORDER — PROPOFOL 10 MG/ML IV BOLUS
INTRAVENOUS | Status: AC
  Filled 2016-03-18: qty 20

## 2016-03-18 MED ORDER — BUPIVACAINE-EPINEPHRINE (PF) 0.25% -1:200000 IJ SOLN
INTRAMUSCULAR | Status: AC
  Filled 2016-03-18: qty 30

## 2016-03-18 MED ORDER — TECHNETIUM TC 99M SULFUR COLLOID FILTERED
1.0000 | Freq: Once | INTRAVENOUS | Status: AC | PRN
  Administered 2016-03-18: 1 via INTRADERMAL

## 2016-03-18 MED ORDER — DEXAMETHASONE SODIUM PHOSPHATE 4 MG/ML IJ SOLN
INTRAMUSCULAR | Status: DC | PRN
Start: 2016-03-18 — End: 2016-03-18
  Administered 2016-03-18: 4 mg via INTRAVENOUS

## 2016-03-18 SURGICAL SUPPLY — 50 items
APPLIER CLIP 9.375 MED OPEN (MISCELLANEOUS) ×3
BINDER BREAST LRG (GAUZE/BANDAGES/DRESSINGS) IMPLANT
BINDER BREAST XLRG (GAUZE/BANDAGES/DRESSINGS) ×3 IMPLANT
BLADE SURG 15 STRL LF DISP TIS (BLADE) ×1 IMPLANT
BLADE SURG 15 STRL SS (BLADE) ×2
CANISTER SUCTION 2500CC (MISCELLANEOUS) ×3 IMPLANT
CHLORAPREP W/TINT 26ML (MISCELLANEOUS) ×3 IMPLANT
CLIP APPLIE 9.375 MED OPEN (MISCELLANEOUS) ×1 IMPLANT
CONT SPEC 4OZ CLIKSEAL STRL BL (MISCELLANEOUS) ×12 IMPLANT
COVER PROBE W GEL 5X96 (DRAPES) ×3 IMPLANT
COVER SURGICAL LIGHT HANDLE (MISCELLANEOUS) ×3 IMPLANT
DERMABOND ADVANCED (GAUZE/BANDAGES/DRESSINGS) ×2
DERMABOND ADVANCED .7 DNX12 (GAUZE/BANDAGES/DRESSINGS) ×1 IMPLANT
DEVICE DUBIN SPECIMEN MAMMOGRA (MISCELLANEOUS) IMPLANT
DRAPE CHEST BREAST 15X10 FENES (DRAPES) ×3 IMPLANT
DRAPE PROXIMA HALF (DRAPES) ×3 IMPLANT
DRAPE UTILITY XL STRL (DRAPES) ×3 IMPLANT
DRSG PAD ABDOMINAL 8X10 ST (GAUZE/BANDAGES/DRESSINGS) ×3 IMPLANT
ELECT CAUTERY BLADE 6.4 (BLADE) ×3 IMPLANT
ELECT REM PT RETURN 9FT ADLT (ELECTROSURGICAL) ×3
ELECTRODE REM PT RTRN 9FT ADLT (ELECTROSURGICAL) ×1 IMPLANT
GAUZE SPONGE 4X4 12PLY STRL (GAUZE/BANDAGES/DRESSINGS) ×6 IMPLANT
GLOVE EUDERMIC 7 POWDERFREE (GLOVE) ×9 IMPLANT
GLOVE SURG SS PI 7.0 STRL IVOR (GLOVE) ×3 IMPLANT
GOWN STRL REUS W/ TWL LRG LVL3 (GOWN DISPOSABLE) ×1 IMPLANT
GOWN STRL REUS W/ TWL XL LVL3 (GOWN DISPOSABLE) ×1 IMPLANT
GOWN STRL REUS W/TWL LRG LVL3 (GOWN DISPOSABLE) ×2
GOWN STRL REUS W/TWL XL LVL3 (GOWN DISPOSABLE) ×2
KIT BASIN OR (CUSTOM PROCEDURE TRAY) ×3 IMPLANT
KIT MARKER MARGIN INK (KITS) ×6 IMPLANT
NDL SAFETY ECLIPSE 18X1.5 (NEEDLE) ×1 IMPLANT
NEEDLE HYPO 18GX1.5 SHARP (NEEDLE) ×2
NEEDLE HYPO 25X1 1.5 SAFETY (NEEDLE) ×3 IMPLANT
NS IRRIG 1000ML POUR BTL (IV SOLUTION) IMPLANT
PACK SURGICAL SETUP 50X90 (CUSTOM PROCEDURE TRAY) ×3 IMPLANT
PAD ABD 8X10 STRL (GAUZE/BANDAGES/DRESSINGS) ×3 IMPLANT
PENCIL BUTTON HOLSTER BLD 10FT (ELECTRODE) ×3 IMPLANT
SPONGE GAUZE 4X4 12PLY STER LF (GAUZE/BANDAGES/DRESSINGS) ×6 IMPLANT
SPONGE LAP 18X18 X RAY DECT (DISPOSABLE) ×3 IMPLANT
SUT MNCRL AB 4-0 PS2 18 (SUTURE) ×6 IMPLANT
SUT SILK 2 0 SH (SUTURE) ×3 IMPLANT
SUT VIC AB 2-0 CT1 18 (SUTURE) ×6 IMPLANT
SUT VIC AB 3-0 SH 18 (SUTURE) ×3 IMPLANT
SYR BULB 3OZ (MISCELLANEOUS) ×3 IMPLANT
SYR CONTROL 10ML LL (SYRINGE) ×3 IMPLANT
TOWEL OR 17X24 6PK STRL BLUE (TOWEL DISPOSABLE) ×3 IMPLANT
TOWEL OR 17X26 10 PK STRL BLUE (TOWEL DISPOSABLE) ×3 IMPLANT
TUBE CONNECTING 12'X1/4 (SUCTIONS) ×2
TUBE CONNECTING 12X1/4 (SUCTIONS) ×4 IMPLANT
YANKAUER SUCT BULB TIP NO VENT (SUCTIONS) ×3 IMPLANT

## 2016-03-18 NOTE — Interval H&P Note (Signed)
History and Physical Interval Note:  03/18/2016 10:20 AM  Kayla Baker  has presented today for surgery, with the diagnosis of INVASIVE CANCER LEFT BREAST, UPPER INNER QUADRANT  The various methods of treatment have been discussed with the patient and family. After consideration of risks, benefits and other options for treatment, the patient has consented to  Procedure(s): RADIOACTIVE SEED GUIDED LEFT PARTIAL MASTECTOMY WITH AXILLARY SENTINEL LYMPH NODE BIOPSY AND BLUE DYE INJECTION (Left) as a surgical intervention .  The patient's history has been reviewed, patient examined, no change in status, stable for surgery.  I have reviewed the patient's chart and labs.  Questions were answered to the patient's satisfaction.     [name]

## 2016-03-18 NOTE — Transfer of Care (Signed)
Immediate Anesthesia Transfer of Care Note  Patient: Kayla Baker  Procedure(s) Performed: Procedure(s): RADIOACTIVE SEED GUIDED LEFT PARTIAL MASTECTOMY WITH AXILLARY SENTINEL LYMPH NODE BIOPSY AND BLUE DYE INJECTION (Left)  Patient Location: PACU  Anesthesia Type:General  Level of Consciousness: awake, alert  and oriented  Airway & Oxygen Therapy: Patient Spontanous Breathing and Patient connected to face mask oxygen  Post-op Assessment: Report given to RN, Post -op Vital signs reviewed and stable and Patient moving all extremities X 4  Post vital signs: Reviewed and stable  Last Vitals:  Filed Vitals:   03/18/16 0959  BP: 153/86  Pulse: 89  Temp: 36.6 C  Resp: 18    Complications: No apparent anesthesia complications

## 2016-03-18 NOTE — Op Note (Signed)
Patient Name:           Kayla Baker   Date of Surgery:        03/18/2016  Pre op Diagnosis:      Invasive ductal carcinoma left breast, upper central, clinical stage T2, N0  Post op Diagnosis:    Same  Procedure:                 Inject blue dye left breast,                                      Partial mastectomy left breast with radioactive seed localization                                      Adjacent tissue transfer, for breast reconstruction, 10 cm wide by 6 cm vertical by 3 cm deep                                       Left axillary sentinel lymph node mapping and biopsy                                 Surgeon:                     Edsel Petrin. Dalbert Batman, M.D., FACS  Assistant:                      Sharyn Dross, RNFA  Operative Indications:   This is a 80 year old Caucasian female who is referred by Dr. Nolon Nations at the breast center of [facility] for evaluation and treatment of a palpable cancer in the left breast at the 11:00 position. Dr. [name] is her PCP. She wa evaluated in the [facility] recently by [name], Dr. Lisbeth Renshaw, and me. We have discussed her case at breast conference. I have coordinated her care plan with [name] and Dr. Lisbeth Renshaw.   The patient felt a lump in her left breast in January. Painless. No skin change or nipple discharge. Recent imaging studies show a 2.2 cm mass in the left breast at the 11:00 position, 4 cm from the nipple, upper inner quadrant. By physical exam the mass feels at least 3.5 cm in size, however.  Multiple cysts were seen. Image guided biopsy showed grade 2 invasive ductal carcinoma and DCIS. Estrogen receptor 90%, progesterone receptor 10%, Ki-67 30%, HER-2 negative. She has had some bruising and may be a small hematoma in this area but feels fine. She is strongly motivated for lumpectomy.  Past history reveals total abdominal hysterectomy without BSO through Pfannenstiel incision. Hypertension. Gout.  Nondistended. Diabetes mellitus. She may have chronic kidney disease. Hyperlipidemia. Diabetic peripheral neuropathy. Sister had breast cancer at age 30. No other breast cancer the family. Sister had lung cancer. Cousin had pancreatic cancer.  We talked about the difference between mastectomy and lumpectomy with radiation therapy, sentinel node biopsy. We talked about immediate and delayed plastic surgical reconstruction. She knows that lumpectomy is easier to recover from and is appropriate but that because of the size of the tumor that she is going to lose some volume and probably will  have some symmetry issues that may or may not require a symmetry procedure on the opposite side a later date. She knows that there is at least 10% risk of reoperation for positive margins. She is comfortable with all of this and really wants to go ahead and try for lumpectomy.  She does not want to have a mastectomy and less absolutely necessary.  She states she will except some cosmetic issues.      She will be scheduled for left breast lumpectomy with radioactive seed localization, Injection of blue dye, left axillary sentinel node biopsy. Marland Kitchen  Operative Findings:       I was able to hear the radioactive seed signal with the neoprobe in the holding area.  The patient underwent injection of radionuclide, technetium 99, in the holding area by the nuclear medicine technician.  The patient had a left pectoral block performed by anesthesia.  In the operating room I found 3 sentinel lymph nodes.  I took a very generous breast specimen all the way down to the pectoralis fascia..  I felt like I probably got a negative margins but the tissues were somewhat thick inferiorly and laterally.  The specimen mammogram, however, looked very good with the mass and the marker clip in the center of the specimen and it looked like there was a margin.  To reconstruct the breast defect which was 10 cm transversely by 6 cm  vertically by 3 cm deep I did extensive undermining superiorly and inferiorly and did tissue transfer breast tissue and multiple layer closure.  Procedure in Detail:          Following the induction of general LMA anesthesia a surgical timeout was performed.  Intravenous antibiotics were given.  Following alcohol prep I injected 5 mL of blue dye into the left breast, subareolar area.  This was methylene blue mixed with saline and I massaged the breast for a few minutes.     The left breast chest wall and axilla were then prepped and draped in a sterile fashion.  I was able to palpate the tumor in the left breast at the 11:30 position.  I used the neoprobe on I-125 as well to help design and incision.  The incision was a transverse circumareolar curvilinear incision taking a thin ellipse of skin anteriorly.  Incision was made with the knife.  The lumpectomy was performed with electrocautery using the neoprobe and palpation as a guide to get widely around this.  I would all the way down to the pectoralis fascia.  The specimen was removed and marked with silk sutures and a 6 color ink kit to orient the pathologist.  After marking the specimen we did a specimen mammogram and it looked good as mentioned above.  The radiologist thought it looked good and the specimen was marked and sent to the lab.  Hemostasis was excellent and achieved with electrocautery.  Metal clips were placed in the lumpectomy cavity in the usual fashion to orient the radiation oncologist.  I extensively undermined the breast tissue superiorly and inferiorly off of the pectoralis fascia which allowed me to transfer the tissue together with multiple sutures in multiple layers of 2-0 Vicryl.  The dimensions of the tissue transfer are detailed above.  The skin was closed with a running subcutaneous taken of 4-0 Monocryl and Dermabond.     Transverse incision was made in the left axilla.  Using the neoprobe on Tc-99 I dissected down through the  subcutaneous tissue and through the  clavipectoral fascia.  I entered the axilla and found 3 obvious sentinel lymph nodes which were hot and blue and they were sent to the lab for routine histology.  Hemostasis was excellent and achieved with  electrocautery.  This wound was irrigated with saline.  The deeper tissues were closed with 3-0 Vicryl sutures and the skin closed with a running subcuticular 4-0 Monocryl and Dermabond.  The patient tolerated the procedure well.  Breast binder was placed.  She was taken to PACU in stable condition.  EBL 25 mL.  Counts correct.  Complications none.     Edsel Petrin. Dalbert Batman, M.D., FACS General and Minimally Invasive Surgery Breast and Colorectal Surgery  03/18/2016 12:46 PM

## 2016-03-18 NOTE — Discharge Instructions (Signed)
[facility] °Office Phone Number [phone] ° °BREAST BIOPSY/ PARTIAL MASTECTOMY: POST OP INSTRUCTIONS ° °Always review your discharge instruction sheet given to you by the facility where your surgery was performed. ° °IF YOU HAVE DISABILITY OR FAMILY LEAVE FORMS, YOU MUST BRING THEM TO THE OFFICE FOR PROCESSING.  DO NOT GIVE THEM TO YOUR DOCTOR. ° °1. A prescription for pain medication may be given to you upon discharge.  Take your pain medication as prescribed, if needed.  If narcotic pain medicine is not needed, then you may take acetaminophen (Tylenol) or ibuprofen (Advil) as needed. °2. Take your usually prescribed medications unless otherwise directed °3. If you need a refill on your pain medication, please contact your pharmacy.  They will contact our office to request authorization.  Prescriptions will not be filled after 5pm or on week-ends. °4. You should eat very light the first 24 hours after surgery, such as soup, crackers, pudding, etc.  Resume your normal diet the day after surgery. °5. Most patients will experience some swelling and bruising in the breast.  Ice packs and a good support bra will help.  Swelling and bruising can take several days to resolve.  °6. It is common to experience some constipation if taking pain medication after surgery.  Increasing fluid intake and taking a stool softener will usually help or prevent this problem from occurring.  A mild laxative (Milk of Magnesia or Miralax) should be taken according to package directions if there are no bowel movements after 48 hours. °7. Unless discharge instructions indicate otherwise, you may remove your bandages 24-48 hours after surgery, and you may shower at that time.  You may have steri-strips (small skin tapes) in place directly over the incision.  These strips should be left on the skin for 7-10 days.  If your surgeon used skin glue on the incision, you may shower in 24 hours.  The glue will flake off over the  next 2-3 weeks.  Any sutures or staples will be removed at the office during your follow-up visit. °8. ACTIVITIES:  You may resume regular daily activities (gradually increasing) beginning the next day.  Wearing a good support bra or sports bra minimizes pain and swelling.  You may have sexual intercourse when it is comfortable. °a. You may drive when you no longer are taking prescription pain medication, you can comfortably wear a seatbelt, and you can safely maneuver your car and apply brakes. °b. RETURN TO WORK:  ______________________________________________________________________________________ °9. You should see your doctor in the office for a follow-up appointment approximately two weeks after your surgery.  Your doctor’s nurse will typically make your follow-up appointment when she calls you with your pathology report.  Expect your pathology report 2-3 business days after your surgery.  You may call to check if you do not hear from us after three days. °10. OTHER INSTRUCTIONS: _______________________________________________________________________________________________ _____________________________________________________________________________________________________________________________________ °_____________________________________________________________________________________________________________________________________ °_____________________________________________________________________________________________________________________________________ ° °WHEN TO CALL YOUR DOCTOR: °1. Fever over 101.0 °2. Nausea and/or vomiting. °3. Extreme swelling or bruising. °4. Continued bleeding from incision. °5. Increased pain, redness, or drainage from the incision. ° °The clinic staff is available to answer your questions during regular business hours.  Please don’t hesitate to call and ask to speak to one of the nurses for clinical concerns.  If you have a medical emergency, go to the nearest  emergency room or call 911.  A surgeon from [facility] is always on call at the hospital. ° °For further questions, please visit [email or web address]  °

## 2016-03-18 NOTE — Anesthesia Procedure Notes (Addendum)
Anesthesia Regional Block:  Pectoralis block  Pre-Anesthetic Checklist: ,, timeout performed, Correct Patient, Correct Site, Correct Laterality, Correct Procedure, Correct Position, site marked, Risks and benefits discussed, post-op pain management  Laterality: Left  Prep: chloraprep       Needles:   Needle Type: Echogenic Needle     Needle Length: 9cm 9 cm Needle Gauge: 24 and 24 G    Additional Needles:  Procedures: ultrasound guided (picture in chart) Pectoralis block Narrative:  End time: 03/18/2016 10:58 AM Injection made incrementally with aspirations every 5 mL.  Performed by: Personally  Anesthesiologist: Lyndle Herrlich  Additional Notes: 25cc.5% Ropiv........ tolerated well   Procedure Name: LMA Insertion Date/Time: 03/18/2016 11:25 AM Performed by: Neldon Newport Pre-anesthesia Checklist: Patient identified, Emergency Drugs available, Suction available, Patient being monitored and Timeout performed Patient Re-evaluated:Patient Re-evaluated prior to inductionOxygen Delivery Method: Circle system utilized Preoxygenation: Pre-oxygenation with 100% oxygen Intubation Type: IV induction Ventilation: Mask ventilation without difficulty LMA: LMA inserted LMA Size: 4.0 Number of attempts: 1 Dental Injury: Teeth and Oropharynx as per pre-operative assessment

## 2016-03-18 NOTE — Anesthesia Preprocedure Evaluation (Addendum)
Anesthesia Evaluation  Patient identified by MRN, date of birth, ID band Patient awake    Reviewed: Allergy & Precautions, H&P , Patient's Chart, lab work & pertinent test results, reviewed documented beta blocker date and time   Airway Mallampati: II  TM Distance: >3 FB Neck ROM: full    Dental no notable dental hx. (+) Dental Advidsory Given   Pulmonary    Pulmonary exam normal breath sounds clear to auscultation       Cardiovascular hypertension, On Medications  Rhythm:regular Rate:Normal     Neuro/Psych  Neuromuscular disease    GI/Hepatic GERD  Medicated and Controlled,  Endo/Other  diabetes  Renal/GU      Musculoskeletal   Abdominal   Peds  Hematology   Anesthesia Other Findings   Reproductive/Obstetrics                            Anesthesia Physical Anesthesia Plan  ASA: II  Anesthesia Plan:    Post-op Pain Management:    Induction: Intravenous  Airway Management Planned: LMA  Additional Equipment:   Intra-op Plan:   Post-operative Plan:   Informed Consent: I have reviewed the patients History and Physical, chart, labs and discussed the procedure including the risks, benefits and alternatives for the proposed anesthesia with the patient or authorized representative who has indicated his/her understanding and acceptance.   Dental Advisory Given and Dental advisory given  Plan Discussed with: CRNA and Surgeon  Anesthesia Plan Comments: (Discussed GA with LMA, possible sore throat, potential need to switch to ETT, N/V, pulmonary aspiration. Questions answered. )        Anesthesia Quick Evaluation

## 2016-03-19 ENCOUNTER — Encounter (HOSPITAL_COMMUNITY): Payer: Self-pay | Admitting: General Surgery

## 2016-03-19 NOTE — Anesthesia Postprocedure Evaluation (Signed)
Anesthesia Post Note  Patient: Kayla Baker  Procedure(s) Performed: Procedure(s) (LRB): RADIOACTIVE SEED GUIDED LEFT PARTIAL MASTECTOMY WITH AXILLARY SENTINEL LYMPH NODE BIOPSY AND BLUE DYE INJECTION (Left)  Anesthesia Post Evaluation  Last Vitals:  Filed Vitals:   03/18/16 1404 03/18/16 1415  BP:  161/85  Pulse:  81  Temp: 36.4 C   Resp:      Last Pain:  Filed Vitals:   03/18/16 1419  PainSc: 4                  Wheeler Incorva[facility] EDWARD

## 2016-03-20 ENCOUNTER — Encounter: Payer: Self-pay | Admitting: Physical Therapy

## 2016-03-21 ENCOUNTER — Encounter: Payer: Self-pay | Admitting: Physical Therapy

## 2016-03-25 ENCOUNTER — Ambulatory Visit: Payer: PPO | Admitting: Hematology and Oncology

## 2016-03-25 NOTE — Progress Notes (Signed)
Quick Note:  Inform patient of Pathology report,.You may have already called her, but please check. Breast pathology reveals large cancer but negative margins and negative nodes, so best we could hope for and pretty good. Will discuss in office.  hmi ______

## 2016-03-27 ENCOUNTER — Ambulatory Visit (HOSPITAL_BASED_OUTPATIENT_CLINIC_OR_DEPARTMENT_OTHER): Payer: PPO | Admitting: Hematology and Oncology

## 2016-03-27 ENCOUNTER — Ambulatory Visit (HOSPITAL_BASED_OUTPATIENT_CLINIC_OR_DEPARTMENT_OTHER): Payer: Self-pay | Admitting: Genetic Counselor

## 2016-03-27 ENCOUNTER — Encounter: Payer: Self-pay | Admitting: *Deleted

## 2016-03-27 ENCOUNTER — Other Ambulatory Visit: Payer: PPO

## 2016-03-27 ENCOUNTER — Encounter: Payer: Self-pay | Admitting: Hematology and Oncology

## 2016-03-27 ENCOUNTER — Telehealth: Payer: Self-pay | Admitting: Hematology and Oncology

## 2016-03-27 VITALS — BP 128/63 | HR 105 | Temp 97.8°F | Resp 17 | Wt 152.6 lb

## 2016-03-27 DIAGNOSIS — C50212 Malignant neoplasm of upper-inner quadrant of left female breast: Secondary | ICD-10-CM | POA: Diagnosis not present

## 2016-03-27 DIAGNOSIS — Z800 Family history of malignant neoplasm of digestive organs: Secondary | ICD-10-CM

## 2016-03-27 DIAGNOSIS — Z170 Estrogen receptor positive status [ER+]: Secondary | ICD-10-CM | POA: Diagnosis not present

## 2016-03-27 DIAGNOSIS — Z8489 Family history of other specified conditions: Secondary | ICD-10-CM

## 2016-03-27 DIAGNOSIS — Z801 Family history of malignant neoplasm of trachea, bronchus and lung: Secondary | ICD-10-CM

## 2016-03-27 DIAGNOSIS — Z315 Encounter for genetic counseling: Secondary | ICD-10-CM

## 2016-03-27 DIAGNOSIS — C50412 Malignant neoplasm of upper-outer quadrant of left female breast: Secondary | ICD-10-CM | POA: Diagnosis not present

## 2016-03-27 DIAGNOSIS — Z803 Family history of malignant neoplasm of breast: Secondary | ICD-10-CM | POA: Diagnosis not present

## 2016-03-27 DIAGNOSIS — Z86010 Personal history of colonic polyps: Secondary | ICD-10-CM | POA: Diagnosis not present

## 2016-03-27 NOTE — Progress Notes (Signed)
Ordered Oncotype Dx test.  Faxed request to Pathology.  Spoke with Tammy in Path & verified she received request.  Placed a note to retrieve results w/ released.

## 2016-03-27 NOTE — Assessment & Plan Note (Signed)
Left lumpectomy 03/08/2016: Invasive ductal carcinoma, grade 2, 6.3 cm, with high-grade DCIS, margins negative, 0/4 lymph nodes negative, ER 90%, via 10%, HER-2 negative ratio 0.97, Ki-67 30%, T3 N0 stage IIB  Pathology counseling: I discussed the final pathology report of the patient provided  a copy of this report. I discussed the margins as well as lymph node surgeries. We also discussed the final staging along with previously performed ER/PR and HER-2/neu testing.  Recommendations: 1. Oncotype DX testing to determine if chemotherapy would be of any benefit followed by 2. Adjuvant radiation therapy followed by 3. Adjuvant antiestrogen therapy  Return to clinic based upon Oncotype DX test results.

## 2016-03-27 NOTE — Progress Notes (Signed)
Patient Care Team: [name], MD as PCP - General Macarthur Critchley, OD as Referring Physician (Optometry) [name], MD as Consulting Physician (Dermatology) Latanya Maudlin, MD as Consulting Physician (Orthopedic Surgery) Inda Castle, MD as Consulting Physician (Gastroenterology)  DIAGNOSIS: Breast cancer of upper-inner quadrant of left female breast [facility])   Staging form: Breast, AJCC 7th Edition     Clinical stage from 03/05/2016: Stage IIA (T2, N0, M0) - Unsigned     Pathologic: Stage IIB (T3, N0, cM0) - Signed by [name], MD on 03/27/2016   SUMMARY OF ONCOLOGIC HISTORY:   Breast cancer of upper-inner quadrant of left female breast ([place])   02/26/2016 Initial Diagnosis Left breast biopsy 11:00 position: invasive ductal carcinoma with DCIS, ER 90%, PR 10%, HER-2 negative, Ki-67 30%, grade 2, 2.2 cm palpable lesion T2 N0 stage II a clinical stage   03/18/2016 Surgery Left lumpectomy: Invasive ductal carcinoma, grade 2, 6.3 cm, with high-grade DCIS, margins negative, 0/4 lymph nodes negative, ER 90%, via 10%, HER-2 negative ratio 0.97, Ki-67 30%, T3 N0 stage IIB    CHIEF COMPLIANT: follow-up after left lumpectomy  INTERVAL HISTORY: Kayla Baker is a 80 year old with above-mentioned history of left breast cancer who underwent lumpectomy and is here today to discuss the pathology report. She is recovering very well from the surgery. The final tumor sizes to 6.3 cm much bigger than what was expected. Lymph nodes are negative for cancer.  REVIEW OF SYSTEMS:   Constitutional: Denies fevers, chills or abnormal weight loss Eyes: Denies blurriness of vision Ears, nose, mouth, throat, and face: Denies mucositis or sore throat Respiratory: Denies cough, dyspnea or wheezes Cardiovascular: Denies palpitation, chest discomfort Gastrointestinal:  Denies nausea, heartburn or change in bowel habits Skin: Denies abnormal skin rashes Lymphatics: Denies new lymphadenopathy or easy  bruising Neurological:Denies numbness, tingling or new weaknesses Behavioral/Psych: Mood is stable, no new changes  Extremities: No lower extremity edema Breast:  Recent left lumpectomy All other systems were reviewed with the patient and are negative.  I have reviewed the past medical history, past surgical history, social history and family history with the patient and they are unchanged from previous note.  ALLERGIES:  is allergic to keflex; meloxicam; prednisone; premarin; and trovan.  MEDICATIONS:  Current Outpatient Prescriptions  Medication Sig Dispense Refill  . allopurinol (ZYLOPRIM) 300 MG tablet TAKE 1 TABLET BY MOUTH EVERY DAY 90 tablet 1  . amitriptyline (ELAVIL) 25 MG tablet Take 1 to 2 tablets at bedtime for neuritis 180 tablet 1  . aspirin 81 MG tablet Take 81 mg by mouth daily.    Marland Kitchen atorvastatin (LIPITOR) 40 MG tablet TAKE 1 TABLET BY MOUTH EVERY DAY (Patient taking differently: TAKE 20 MG TABLET BY MOUTH EVERY DAY) 90 tablet 1  . cholecalciferol (VITAMIN D) 1000 UNITS tablet Take 1,000 Units by mouth daily.    Marland Kitchen CINNAMON PO Take 1,000 mg by mouth 2 (two) times daily.    . enalapril (VASOTEC) 20 MG tablet TAKE 1 TABLET BY MOUTH DAILY 90 tablet 1  . FREESTYLE LITE test strip USE ONE STRIP TO CHECK GLUCOSE ONCE DAILY. 100 each 0  . gabapentin (NEURONTIN) 100 MG capsule Take 1 to 3 capsules at bedtime for neuritis 90 capsule 5  . hydrochlorothiazide (HYDRODIURIL) 25 MG tablet TAKE 1 TABLET BY MOUTH DAILY 90 tablet 3  . HYDROcodone-acetaminophen (NORCO) 5-325 MG tablet Take 1-2 tablets by mouth every 6 (six) hours as needed for moderate pain or severe pain. 30 tablet 0  .  Magnesium 250 MG TABS Take 250 mg by mouth 2 (two) times daily. Takes 2 tabs in am and 2 tabs in pm    . Multiple Vitamin (MULTIVITAMIN) tablet Take 1 tablet by mouth daily.    . Omega-3 Fatty Acids (FISH OIL) 1200 MG CAPS Take by mouth daily. Takes 2 capsules daily    . ranitidine (ZANTAC) 300 MG tablet  Take twice a day as needed for Heartburn & Acid Indigestion 60 tablet 3  . vitamin B-12 (CYANOCOBALAMIN) 1000 MCG tablet Take 1,000 mcg by mouth daily. Takes 1 tablet every other day    . vitamin C (ASCORBIC ACID) 500 MG tablet Take 500 mg by mouth daily. Takes 2 tablets daily     No current facility-administered medications for this visit.    PHYSICAL EXAMINATION: ECOG PERFORMANCE STATUS: 1 - Symptomatic but completely ambulatory  Filed Vitals:   03/27/16 1429  BP: 128/63  Pulse: 105  Temp: 97.8 F (36.6 C)  Resp: 17   Filed Weights   03/27/16 1429  Weight: 152 lb 9.6 oz (69.219 kg)    GENERAL:alert, no distress and comfortable SKIN: skin color, texture, turgor are normal, no rashes or significant lesions EYES: normal, Conjunctiva are pink and non-injected, sclera clear OROPHARYNX:no exudate, no erythema and lips, buccal mucosa, and tongue normal  NECK: supple, thyroid normal size, non-tender, without nodularity LYMPH:  no palpable lymphadenopathy in the cervical, axillary or inguinal LUNGS: clear to auscultation and percussion with normal breathing effort HEART: regular rate & rhythm and no murmurs and no lower extremity edema ABDOMEN:abdomen soft, non-tender and normal bowel sounds MUSCULOSKELETAL:no cyanosis of digits and no clubbing  NEURO: alert & oriented x 3 with fluent speech, no focal motor/sensory deficits EXTREMITIES: No lower extremity edema   LABORATORY DATA:  I have reviewed the data as listed   Chemistry      Component Value Date/Time   NA 142 03/18/2016 1116   NA 141 03/05/2016 0825   K 4.0 03/18/2016 1116   K 4.0 03/05/2016 0825   CL 108 03/18/2016 1116   CO2 24 03/18/2016 1116   CO2 30* 03/05/2016 0825   BUN 19 03/18/2016 1116   BUN 23.2 03/05/2016 0825   CREATININE 1.50* 03/18/2016 1116   CREATININE 1.6* 03/05/2016 0825   CREATININE 1.38* 12/21/2015 1129      Component Value Date/Time   CALCIUM 9.8 03/18/2016 1116   CALCIUM 10.1 03/05/2016  0825   ALKPHOS 79 03/13/2016 1416   ALKPHOS 84 03/05/2016 0825   AST 35 03/13/2016 1416   AST 26 03/05/2016 0825   ALT 32 03/13/2016 1416   ALT 28 03/05/2016 0825   BILITOT 0.6 03/13/2016 1416   BILITOT 0.40 03/05/2016 0825       Lab Results  Component Value Date   WBC 5.8 03/13/2016   HGB 12.1 03/13/2016   HCT 36.7 03/13/2016   MCV 89.3 03/13/2016   PLT 186 03/13/2016   NEUTROABS 2.9 03/13/2016   ASSESSMENT & PLAN:  Breast cancer of upper-inner quadrant of left female breast ([place]) Left lumpectomy 03/08/2016: Invasive ductal carcinoma, grade 2, 6.3 cm, with high-grade DCIS, margins negative, 0/4 lymph nodes negative, ER 90%, via 10%, HER-2 negative ratio 0.97, Ki-67 30%, T3 N0 stage IIB  Pathology counseling: I discussed the final pathology report of the patient provided  a copy of this report. I discussed the margins as well as lymph node surgeries. We also discussed the final staging along with previously performed ER/PR and HER-2/neu testing.  Recommendations: 1. Oncotype DX testing to determine if chemotherapy would be of any benefit followed by 2. Adjuvant radiation therapy followed by 3. Adjuvant antiestrogen therapy  Return to clinic based upon Oncotype DX test results.    No orders of the defined types were placed in this encounter.   The patient has a good understanding of the overall plan. she agrees with it. she will call with any problems that may develop before the next visit here.   Rulon Eisenmenger, MD 03/27/2016

## 2016-03-27 NOTE — Progress Notes (Signed)
Unable to get in to exam room prior to MD.  No assessment performed.  

## 2016-03-27 NOTE — Telephone Encounter (Signed)
Gave patient avs report and appointments for July.  °

## 2016-03-28 ENCOUNTER — Encounter: Payer: Self-pay | Admitting: Genetic Counselor

## 2016-03-28 NOTE — Progress Notes (Signed)
REFERRING PROVIDER: [name], MD  PRIMARY PROVIDER:  Alesia Richards, MD  PRIMARY REASON FOR VISIT:  1. Breast cancer of upper-inner quadrant of left female breast ([place])   2. Family history of breast cancer in sister   35. Family history of pancreatic cancer   4. Family history of esophageal cancer   5. Family history of lung cancer   6. Family history of brain tumor      HISTORY OF PRESENT ILLNESS:   Kayla Baker, a 80 y.o. female, was seen for a [facility] cancer genetics consultation at the request of [name] due to a personal history of breast cancer and family history of breast and pancreatic cancers.  Kayla Baker presents to clinic today with her sister Kayla Baker to discuss the possibility of a hereditary predisposition to cancer, genetic testing, and to further clarify her future cancer risks, as well as potential cancer risks for family members.   In March 2017, at the age of 6, Kayla Baker was diagnosed with invasive ductal carcinoma with DCIS of the left breast.  Hormone receptor status was ER/PR+, Her2-.  This was treated with left lumpectomy.   CANCER HISTORY:    Breast cancer of upper-inner quadrant of left female breast ([place])   02/26/2016 Initial Diagnosis Left breast biopsy 11:00 position: invasive ductal carcinoma with DCIS, ER 90%, PR 10%, HER-2 negative, Ki-67 30%, grade 2, 2.2 cm palpable lesion T2 N0 stage II a clinical stage   03/18/2016 Surgery Left lumpectomy: Invasive ductal carcinoma, grade 2, 6.3 cm, with high-grade DCIS, margins negative, 0/4 lymph nodes negative, ER 90%, via 10%, HER-2 negative ratio 0.97, Ki-67 30%, T3 N0 stage IIB     HORMONAL RISK FACTORS:  Menarche was at age 89.  First live birth at age - no children.  OCP use for approximately 0 years.  Ovaries intact: yes.  Hysterectomy: yes in 1973 for fibroids and heavy bleeding Menopausal status: postmenopausal.  HRT use: 22 years. Colonoscopy: yes; most recent in 2014 found one tubular adenoma  in the ascending colon; no other polyps found previously. Mammogram within the last year: typically receives annually, but did skip 2015. Number of breast biopsies: 1. Up to date with pelvic exams:  no. Any excessive radiation exposure in the past:  no, but does report some history of secondhand smoke exposure.  Past Medical History  Diagnosis Date  . Hyperlipidemia     takes Atorvastatin daily  . Gout     takes Allopurinol daily  . Allergy   . Vitamin D deficiency   . Anemia   . Breast cancer of upper-inner quadrant of left female breast ([place]) 02/29/2016  . Arthritis   . PONV (postoperative nausea and vomiting)   . Hypertension     takes Enalapril and HCTZ daily  . Peripheral neuropathy (HCC)     takes Gabapentin and Elavil daily  . GERD (gastroesophageal reflux disease)   . History of colon polyps     benign  . Diverticulosis   . Diet-controlled type 2 diabetes mellitus ([place])     diet and exercsise,Has never been on meds  . History of shingles     Past Surgical History  Procedure Laterality Date  . Cataract extraction, bilateral  2014    right eye 2/14; left eye 3/3  . Abdominal hysterectomy  1973  . Incontinence surgery  2006  . Knee arthroscopy Right   . Hand surgery Left 2012  . Colonoscopy    . Radioactive seed guided mastectomy  with axillary sentinel lymph node biopsy Left 03/18/2016    Procedure: RADIOACTIVE SEED GUIDED LEFT PARTIAL MASTECTOMY WITH AXILLARY SENTINEL LYMPH NODE BIOPSY AND BLUE DYE INJECTION;  Surgeon: Fanny Skates, MD;  Location: [facility];  Service: General;  Laterality: Left;    Social History   Social History  . Marital Status: Married    Spouse Name: N/A  . Number of Children: N/A  . Years of Education: N/A   Social History Main Topics  . Smoking status: Never Smoker   . Smokeless tobacco: Never Used  . Alcohol Use: No  . Drug Use: No  . Sexual Activity: Not on file   Other Topics Concern  . Not on file   Social History Narrative      FAMILY HISTORY:  We obtained a detailed, 4-generation family history.  Significant diagnoses are listed below: Family History  Problem Relation Age of Onset  . Colon cancer Neg Hx   . Stomach cancer Neg Hx   . Stroke Mother 74  . Other Mother     history of hysterectomy after last childbirth  . Diabetes Father   . Alzheimer's disease Father   . Hypertension Sister   . Lung cancer Sister 64    smoker  . Other Sister     history of hysterectomy for fibroids  . Breast cancer Sister 47  . Esophageal cancer Maternal Aunt 74    not a smoker  . Heart attack Maternal Uncle 65  . Cirrhosis Sister   . Lung cancer Other     nephew dx. 29s; +smoker  . Epilepsy Other   . Other Other 12    great niece dx. benign ganglioglioma brain tumor; treated at [facility]  . Epilepsy Other     no seizures in 2 years  . Parkinson's disease Maternal Aunt   . Stroke Maternal Grandmother   . Heart Problems Maternal Grandmother   . Pancreatic cancer Cousin 15    paternal 1st cousin    Kayla Baker has four full sisters and two full brothers.  Both brothers passed away in their infant years/childhood due to childhood illness.  One sister, a smoker, died of lung cancer at the age of 56.  She also had a history of hysterectomy due to fibroids.  Another sister died of cirrhosis at the ag eof 31.  This sister had four sons and one daughter; one of her sons, a smoker, died of lung cancer in his 14s.  Another of Kayla Baker's sisters, currently 5, has a history of breast cancer diagnosed at age 25.  Kayla Baker's sister Kayla Baker is currently 35 and has never had cancer.  Kayla Baker has a son who has a history of epilepsy and a granddaughter who also has a history of epilepsy.  When her granddaughter had a seizure, at the age of 20, an incidental benign ganglioglioma was found.  She is treated at [facility] and she has not had a seizure in two years.  Kayla Baker's mother died of a stroke at 2.  She also has a history of a hysterectomy after the  birth of her last child.  Kayla Baker's father died of diabetes and complications from Alzheimer's at the age of 61.  Kayla Baker's mother had two full sisters and one full brother.  One sister died of esophageal cancer at the age of 50.  The other sister had parkinson's and died over the age of 79.  Her brother died of a heart attack at the age  of 52.  Kayla Baker is unaware of any cancer history for her maternal first cousins, but she also has limited information for some of them.  Her maternal grandmother died of a stroke and heart problems at the age of 47.  Her grandfather died of an unspecified cause at the age of 64.  Kayla Baker has no information for her maternal great aunts/uncles and great grandparents.  Kayla Baker's father had four full brothers and five full sisters.  One sister died as a baby--during childbirth.  Kayla Baker has limited information for the remaining aunts and uncles, though they have all passed away.  She also mentions that four of these aunts and uncles had no children.  Kayla Baker reports that one paternal first cousin was diagnosed with pancreatic cancer and passed away at the age of 24.  Her paternal grandmother died during childbirth.  Her grandfather died at an unspecified age.  She has no further information for any other paternal relatives.  She is unaware of any previous familial genetic testing for hereditary cancer.  Patient's maternal and paternal ancestors are of Caucasian descent. There is no reported Ashkenazi Jewish ancestry. There is no known consanguinity.  GENETIC COUNSELING ASSESSMENT: Kayla MCGUIRT is a 80 y.o. female with a personal and family history of cancer which is somewhat suggestive of a hereditary breast cancer syndrome and predisposition to cancer. We, therefore, discussed and recommended the following at today's visit.   DISCUSSION: We reviewed the characteristics, features and inheritance patterns of hereditary cancer syndromes, particularly those caused by mutations  within the BRCA1/2 and PALB2 genes. We also discussed genetic testing, including the appropriate family members to test, the process of testing, insurance coverage and turn-around-time for results. We discussed the implications of a negative, positive and/or variant of uncertain significant result. We recommended Kayla Baker pursue genetic testing for the 20-gene Breast/Ovarian Cancer Panel through [facility].  The Breast/Ovarian Cancer Panel offered by [facility] Hope Pigeon, MD) includes sequencing and deletion/duplication analysis for the following 19 genes:  ATM, BARD1, BRCA1, BRCA2, BRIP1, CDH1, CHEK2, FANCC, MLH1, MSH2, MSH6, NBN, PALB2, PMS2, PTEN, RAD51C, RAD51D, TP53, and XRCC2.  This panel also includes deletion/duplication analysis (without sequencing) for one gene, EPCAM.  Based on Kayla Baker's personal and family history of cancer, she meets medical criteria for genetic testing. Despite that she meets criteria, she may still have an out of pocket cost. We discussed that if her out of pocket cost for testing is over $100, the laboratory will call and confirm whether she wants to proceed with testing.  If the out of pocket cost of testing is less than $100 she will be billed by the genetic testing laboratory.   PLAN: After considering the risks, benefits, and limitations, Ms. Shaikh  provided informed consent to pursue genetic testing and the blood sample was sent to [facility] for analysis of the 20-gene Breast/Ovarian Cancer Panel test. Results should be available within approximately 2-3 weeks' time, at which point they will be disclosed by telephone to Ms. Bannan, as will any additional recommendations warranted by these results. Ms. Kratz will receive a summary of her genetic counseling visit and a copy of her results once available. This information will also be available in Epic. We encouraged Ms. Racine to remain in contact with cancer genetics annually so that we can continuously  update the family history and inform her of any changes in cancer genetics and testing that may be of benefit for her family. Ms.  Salmon's questions were answered to her satisfaction today. Our contact information was provided should additional questions or concerns arise.  Thank you for the referral and allowing [place] to share in the care of your patient.   Jeanine Luz, MS, [facility] Certified Genetic Counselor [email or web address] Phone: [phone]  The patient was seen for a total of 60 minutes in face-to-face genetic counseling.  This patient was discussed with Drs. Magrinat, Lindi Adie and/or Burr Medico who agrees with the above.    _______________________________________________________________________ For Office Staff:  Number of people involved in session: 2 Was an Intern/ student involved with case: no

## 2016-04-01 ENCOUNTER — Telehealth: Payer: Self-pay

## 2016-04-01 NOTE — Telephone Encounter (Signed)
Kayla Baker with genomic health called asking for status of specimen. Called pathology and specimen was sent out on 4/28. Called back and s/w another rep. She said the specimen was received yesterday but was not checked in. They will proceed with testing.

## 2016-04-02 ENCOUNTER — Encounter: Payer: Self-pay | Admitting: Internal Medicine

## 2016-04-02 ENCOUNTER — Ambulatory Visit (INDEPENDENT_AMBULATORY_CARE_PROVIDER_SITE_OTHER): Payer: PPO | Admitting: Internal Medicine

## 2016-04-02 VITALS — BP 116/60 | HR 100 | Temp 98.0°F | Resp 16 | Ht 64.0 in | Wt 152.0 lb

## 2016-04-02 DIAGNOSIS — R6889 Other general symptoms and signs: Secondary | ICD-10-CM

## 2016-04-02 DIAGNOSIS — E1122 Type 2 diabetes mellitus with diabetic chronic kidney disease: Secondary | ICD-10-CM | POA: Diagnosis not present

## 2016-04-02 DIAGNOSIS — C50212 Malignant neoplasm of upper-inner quadrant of left female breast: Secondary | ICD-10-CM | POA: Diagnosis not present

## 2016-04-02 DIAGNOSIS — Z803 Family history of malignant neoplasm of breast: Secondary | ICD-10-CM

## 2016-04-02 DIAGNOSIS — E559 Vitamin D deficiency, unspecified: Secondary | ICD-10-CM | POA: Diagnosis not present

## 2016-04-02 DIAGNOSIS — E1140 Type 2 diabetes mellitus with diabetic neuropathy, unspecified: Secondary | ICD-10-CM | POA: Diagnosis not present

## 2016-04-02 DIAGNOSIS — Z79899 Other long term (current) drug therapy: Secondary | ICD-10-CM | POA: Diagnosis not present

## 2016-04-02 DIAGNOSIS — N183 Chronic kidney disease, stage 3 unspecified: Secondary | ICD-10-CM

## 2016-04-02 DIAGNOSIS — E782 Mixed hyperlipidemia: Secondary | ICD-10-CM | POA: Diagnosis not present

## 2016-04-02 DIAGNOSIS — Z0000 Encounter for general adult medical examination without abnormal findings: Secondary | ICD-10-CM

## 2016-04-02 DIAGNOSIS — I10 Essential (primary) hypertension: Secondary | ICD-10-CM

## 2016-04-02 DIAGNOSIS — M1000 Idiopathic gout, unspecified site: Secondary | ICD-10-CM | POA: Diagnosis not present

## 2016-04-02 DIAGNOSIS — Z800 Family history of malignant neoplasm of digestive organs: Secondary | ICD-10-CM

## 2016-04-02 DIAGNOSIS — E1129 Type 2 diabetes mellitus with other diabetic kidney complication: Secondary | ICD-10-CM | POA: Diagnosis not present

## 2016-04-02 DIAGNOSIS — Z0001 Encounter for general adult medical examination with abnormal findings: Secondary | ICD-10-CM

## 2016-04-02 LAB — CBC WITH DIFFERENTIAL/PLATELET
BASOS PCT: 1 %
Basophils Absolute: 67 cells/uL (ref 0–200)
EOS PCT: 5 %
Eosinophils Absolute: 335 cells/uL (ref 15–500)
HEMATOCRIT: 37.7 % (ref 35.0–45.0)
Hemoglobin: 12.3 g/dL (ref 11.7–15.5)
LYMPHS PCT: 32 %
Lymphs Abs: 2144 cells/uL (ref 850–3900)
MCH: 29.2 pg (ref 27.0–33.0)
MCHC: 32.6 g/dL (ref 32.0–36.0)
MCV: 89.5 fL (ref 80.0–100.0)
MONO ABS: 536 {cells}/uL (ref 200–950)
MONOS PCT: 8 %
MPV: 9.7 fL (ref 7.5–12.5)
NEUTROS PCT: 54 %
Neutro Abs: 3618 cells/uL (ref 1500–7800)
PLATELETS: 216 10*3/uL (ref 140–400)
RBC: 4.21 MIL/uL (ref 3.80–5.10)
RDW: 14.9 % (ref 11.0–15.0)
WBC: 6.7 10*3/uL (ref 3.8–10.8)

## 2016-04-02 LAB — HEMOGLOBIN A1C
Hgb A1c MFr Bld: 6.6 % — ABNORMAL HIGH (ref <5.7)
Mean Plasma Glucose: 143 mg/dL

## 2016-04-02 LAB — TSH: TSH: 2.26 mIU/L

## 2016-04-02 NOTE — Progress Notes (Signed)
MEDICARE ANNUAL WELLNESS VISIT AND FOLLOW UP  Assessment:    1. Essential hypertension -cont meds -dash diet  -monitor at home - TSH  2. Type 2 diabetes mellitus with stage 3 chronic kidney disease, without long-term current use of insulin (HCC) -cont monitoring sugar twice weekly -diet and exercise - Hemoglobin A1c  3. Diabetic neuropathy, painful ([place]) -see above - Hemoglobin A1c  4. Hyperlipidemia -cont meds - Lipid panel  5. Vitamin D deficiency -cont Vit D supplement  6. Medication management  - CBC with Differential/Platelet - BASIC METABOLIC PANEL WITH GFR - Hepatic function panel  7. Idiopathic gout, unspecified chronicity, unspecified site -allopurinol   8. Breast cancer of upper-inner quadrant of left female breast ([place]) -followed by [name]  9. Medicare annual wellness visit, subsequent -due next year  74. Family history of breast cancer in sister -genetic testing being done  28. Family history of pancreatic cancer -genetic testing being done     Over 30 minutes of exam, counseling, chart review, and critical decision making was performed  Plan:   During the course of the visit the patient was educated and counseled about appropriate screening and preventive services including:    Pneumococcal vaccine   Influenza vaccine  Td vaccine  Prevnar 13  Screening electrocardiogram  Screening mammography  Bone densitometry screening  Colorectal cancer screening  Diabetes screening  Glaucoma screening  Nutrition counseling   Advanced directives: given info/requested copies  Conditions/risks identified: Diabetes is at goal, ACE/ARB therapy: No, Reason not on Ace Inhibitor/ARB therapy:  Hypotension Urinary Incontinence is not an issue: discussed non pharmacology and pharmacology options.  Fall risk: low- discussed PT, home fall assessment, medications.    Subjective:   Kayla Baker is a 80 y.o. female who presents for Medicare  Annual Wellness Visit and 3 month follow up on hypertension, prediabetes, hyperlipidemia, vitamin D def.  Date of last medicare wellness visit is unknown.   Her blood pressure has been controlled at home, today their BP is BP: 116/60 mmHg She does not workout. She denies chest pain, shortness of breath, dizziness.   She is on cholesterol medication and denies myalgias. Her cholesterol is at goal. The cholesterol last visit was:   Lab Results  Component Value Date   CHOL 176 12/21/2015   HDL 67 12/21/2015   LDLCALC 68 12/21/2015   TRIG 207* 12/21/2015   CHOLHDL 2.6 12/21/2015  She reports that she has been doing well at night time.     She has been working on diet and exercise for prediabetes, and denies foot ulcerations, hyperglycemia, hypoglycemia , increased appetite, nausea, paresthesia of the feet, polydipsia, polyuria, visual disturbances, vomiting and weight loss. Last A1C in the office was:  Lab Results  Component Value Date   HGBA1C 6.4* 03/13/2016  She reports that her blood sugars have been running 107-109. She does have some mild neuropathy in her feet.    Last GFR Lab Results  Component Value Date   GFRNONAA 32* 03/18/2016   Lab Results  Component Value Date   GFRAA 38* 03/18/2016   Patient is on Vitamin D supplement. Lab Results  Component Value Date   VD25OH 64 12/21/2015     She reports that she has done well since her breast cancer surgery.   She reports that she is currently waiting for the genetic testing to be done on her biopsy.   She reports that she is doing the genetic testing to help with her family more than  anything else.  She reports that she hasn't started radiation yet.  She is waiting to hear from [name] for this.    Medication Review Current Outpatient Prescriptions on File Prior to Visit  Medication Sig Dispense Refill  . allopurinol (ZYLOPRIM) 300 MG tablet TAKE 1 TABLET BY MOUTH EVERY DAY 90 tablet 1  . amitriptyline (ELAVIL) 25 MG  tablet Take 1 to 2 tablets at bedtime for neuritis 180 tablet 1  . aspirin 81 MG tablet Take 81 mg by mouth daily.    Marland Kitchen atorvastatin (LIPITOR) 40 MG tablet TAKE 1 TABLET BY MOUTH EVERY DAY (Patient taking differently: TAKE 20 MG TABLET BY MOUTH EVERY DAY) 90 tablet 1  . cholecalciferol (VITAMIN D) 1000 UNITS tablet Take 1,000 Units by mouth daily.    Marland Kitchen CINNAMON PO Take 1,000 mg by mouth 2 (two) times daily.    . enalapril (VASOTEC) 20 MG tablet TAKE 1 TABLET BY MOUTH DAILY 90 tablet 1  . FREESTYLE LITE test strip USE ONE STRIP TO CHECK GLUCOSE ONCE DAILY. 100 each 0  . gabapentin (NEURONTIN) 100 MG capsule Take 1 to 3 capsules at bedtime for neuritis 90 capsule 5  . hydrochlorothiazide (HYDRODIURIL) 25 MG tablet TAKE 1 TABLET BY MOUTH DAILY 90 tablet 3  . Magnesium 250 MG TABS Take 250 mg by mouth 2 (two) times daily. Takes 2 tabs in am and 2 tabs in pm    . Multiple Vitamin (MULTIVITAMIN) tablet Take 1 tablet by mouth daily.    . Omega-3 Fatty Acids (FISH OIL) 1200 MG CAPS Take by mouth daily. Takes 2 capsules daily    . ranitidine (ZANTAC) 300 MG tablet Take twice a day as needed for Heartburn & Acid Indigestion 60 tablet 3  . vitamin B-12 (CYANOCOBALAMIN) 1000 MCG tablet Take 1,000 mcg by mouth daily. Takes 1 tablet every other day    . vitamin C (ASCORBIC ACID) 500 MG tablet Take 500 mg by mouth daily. Takes 2 tablets daily     No current facility-administered medications on file prior to visit.    Current Problems (verified) Patient Active Problem List   Diagnosis Date Noted  . Family history of breast cancer in sister 03/28/2016  . Family history of pancreatic cancer 03/28/2016  . Breast cancer of upper-inner quadrant of left female breast ([place]) 02/29/2016  . Medicare annual wellness visit, subsequent 06/19/2015  . Gout 12/08/2014  . Diabetic neuropathy, painful ([place]) 08/28/2014  . Medication management 08/27/2014  . Essential hypertension 11/10/2013  . Hyperlipidemia 11/10/2013   . T2_NIDDM w/CKD3 (GFR 45 ml/min) 11/10/2013  . Vitamin D deficiency 11/10/2013    Screening Tests Immunization History  Administered Date(s) Administered  . DTaP 07/01/2004  . Influenza Whole 08/15/2013  . Influenza, High Dose Seasonal PF 08/28/2014, 09/20/2015  . Pneumococcal Conjugate-13 09/20/2015  . Pneumococcal Polysaccharide-23 11/01/2009  . Td 08/10/2009  . Zoster 08/02/2011    Preventative care: Last colonoscopy: 2014 Last mammogram: 2016 Last pap smear/pelvic exam: remotely DEXA:2015  Prior vaccinations: TD or Tdap: 2010  Influenza: 2016  Pneumococcal: 2010 Prevnar13: 2016 Shingles/Zostavax: 2012  Names of Other Physician/Practitioners you currently use: 1. [facility]- here for primary care 2. Dr. Nicki Reaper, eye doctor, last visit December 2016 3. , dentist, last visit  Patient Care Team: [name], MD as PCP - General Macarthur Critchley, OD as Referring Physician (Optometry) [name], MD as Consulting Physician (Dermatology) Latanya Maudlin, MD as Consulting Physician (Orthopedic Surgery) Inda Castle, MD as  Consulting Physician (Gastroenterology)  Past Surgical History  Procedure Laterality Date  . Cataract extraction, bilateral  2014    right eye 2/14; left eye 3/3  . Abdominal hysterectomy  1973  . Incontinence surgery  2006  . Knee arthroscopy Right   . Hand surgery Left 2012  . Colonoscopy    . Radioactive seed guided mastectomy with axillary sentinel lymph node biopsy Left 03/18/2016    Procedure: RADIOACTIVE SEED GUIDED LEFT PARTIAL MASTECTOMY WITH AXILLARY SENTINEL LYMPH NODE BIOPSY AND BLUE DYE INJECTION;  Surgeon: Fanny Skates, MD;  Location: [facility];  Service: General;  Laterality: Left;   Family History  Problem Relation Age of Onset  . Colon cancer Neg Hx   . Stomach cancer Neg Hx   . Stroke Mother 12  . Other Mother     history of hysterectomy after last childbirth  . Diabetes Father   .  Alzheimer's disease Father   . Hypertension Sister   . Lung cancer Sister 77    smoker  . Other Sister     history of hysterectomy for fibroids  . Breast cancer Sister 65  . Esophageal cancer Maternal Aunt 74    not a smoker  . Heart attack Maternal Uncle 65  . Cirrhosis Sister   . Lung cancer Other     nephew dx. 65s; +smoker  . Epilepsy Other   . Other Other 12    great niece dx. benign ganglioglioma brain tumor; [facility]  . Epilepsy Other     no seizures in 2 years  . Parkinson's disease Maternal Aunt   . Stroke Maternal Grandmother   . Heart Problems Maternal Grandmother   . Pancreatic cancer Cousin 34    paternal 1st cousin   Social History  Substance Use Topics  . Smoking status: Never Smoker   . Smokeless tobacco: Never Used  . Alcohol Use: No   Allergies  Allergen Reactions  . Keflex [Cephalexin] Swelling    Tongue and throat swells  . Meloxicam   . Prednisone     Can Not take High doses  . Premarin [Estrogens Conjugated]   . Trovan [Alatrofloxacin]     MEDICARE WELLNESS OBJECTIVES: Tobacco use: She does not smoke.  Patient is not a former smoker. If yes, counseling given Alcohol Current alcohol use: social drinker Diet: in general, a "healthy" diet   Physical activity: Current Exercise Habits: Home exercise routine, Type of exercise: walking, Time (Minutes): 35, Frequency (Times/Week): 6, Weekly Exercise (Minutes/Week): 210, Intensity: Moderate Cardiac risk factors: Cardiac Risk Factors include: advanced age (>44mn, >>19women);dyslipidemia;hypertension;sedentary lifestyle Depression/mood screen:   [facility] 04/[facility]  Decreased Interest 0  Down, Depressed, Hopeless 0  PHQ - 2 Score 0    ADLs:  In your present state of health, do you have any difficulty performing the following activities: 04/02/2016 03/13/2016  Hearing? N N  Vision? N N  Difficulty concentrating or making decisions? N N  Walking or climbing stairs? N N  Dressing  or bathing? N N  Doing errands, shopping? N N  Preparing Food and eating ? N -  Using the Toilet? N -  In the past six months, have you accidently leaked urine? N -  Do you have problems with loss of bowel control? N -  Managing your Medications? N -  Managing your Finances? N -  Housekeeping or managing your Housekeeping? N -     Cognitive Testing  Alert? Yes  Normal Appearance?Yes  Oriented  to person? Yes  Place? Yes   Time? Yes  Recall of three objects?  Yes  Can perform simple calculations? Yes  Displays appropriate judgment?Yes  Can read the correct time from a watch face?Yes  EOL planning: Does patient have an advance directive?: Yes Type of Advance Directive: Healthcare Power of Attorney, Living will Does patient want to make changes to advanced directive?: No - Patient declined Copy of advanced directive(s) in chart?: No - copy requested   Objective:   Today's Vitals   04/02/16 0848  BP: 116/60  Pulse: 100  Temp: 98 F (36.7 C)  TempSrc: Temporal  Resp: 16  Height: 5' 4"  (1.626 m)  Weight: 152 lb (68.947 kg)   Body mass index is 26.08 kg/(m^2).  General appearance: alert, no distress, WD/WN,  female HEENT: normocephalic, sclerae anicteric, TMs pearly, nares patent, no discharge or erythema, pharynx normal Oral cavity: MMM, no lesions Neck: supple, no lymphadenopathy, no thyromegaly, no masses Heart: RRR, normal S1, S2, no murmurs Lungs: CTA bilaterally, no wheezes, rhonchi, or rales Abdomen: +bs, soft, non tender, non distended, no masses, no hepatomegaly, no splenomegaly Musculoskeletal: nontender, no swelling, no obvious deformity Extremities: no edema, no cyanosis, no clubbing Pulses: 2+ symmetric, upper and lower extremities, normal cap refill Neurological: alert, oriented x 3, CN2-12 intact, strength normal upper extremities and lower extremities, sensation normal throughout, DTRs 2+ throughout, no cerebellar signs, gait normal Psychiatric: normal  affect, behavior normal, pleasant  Breast: Left breast with well healing incision to the superior left breast with no redness, swelling or warmth. Tender to palpation.  Left axilla with well healing incision.  Dermabond peeling   Gyn: defer Rectal: defer   Medicare Attestation I have personally reviewed: The patient's medical and social history Their use of alcohol, tobacco or illicit drugs Their current medications and supplements The patient's functional ability including ADLs,fall risks, home safety risks, cognitive, and hearing and visual impairment Diet and physical activities Evidence for depression or mood disorders  The patient's weight, height, BMI, and visual acuity have been recorded in the chart.  I have made referrals, counseling, and provided education to the patient based on review of the above and I have provided the patient with a written personalized care plan for preventive services.     Starlyn Skeans, PA-C   04/02/2016

## 2016-04-03 LAB — HEPATIC FUNCTION PANEL
ALBUMIN: 4.2 g/dL (ref 3.6–5.1)
ALT: 70 U/L — ABNORMAL HIGH (ref 6–29)
AST: 43 U/L — ABNORMAL HIGH (ref 10–35)
Alkaline Phosphatase: 115 U/L (ref 33–130)
BILIRUBIN INDIRECT: 0.3 mg/dL (ref 0.2–1.2)
BILIRUBIN TOTAL: 0.4 mg/dL (ref 0.2–1.2)
Bilirubin, Direct: 0.1 mg/dL (ref <=0.2)
TOTAL PROTEIN: 6.8 g/dL (ref 6.1–8.1)

## 2016-04-03 LAB — LIPID PANEL
CHOL/HDL RATIO: 3.5 ratio (ref <=5.0)
Cholesterol: 165 mg/dL (ref 125–200)
HDL: 47 mg/dL (ref >=46)
LDL Cholesterol: 65 mg/dL (ref <130)
TRIGLYCERIDES: 267 mg/dL — AB (ref <150)
VLDL: 53 mg/dL — ABNORMAL HIGH (ref <30)

## 2016-04-03 LAB — BASIC METABOLIC PANEL WITH GFR
BUN: 25 mg/dL (ref 7–25)
CALCIUM: 9.7 mg/dL (ref 8.6–10.4)
CO2: 26 mmol/L (ref 20–31)
Chloride: 103 mmol/L (ref 98–110)
Creat: 1.4 mg/dL — ABNORMAL HIGH (ref 0.60–0.93)
GFR, EST AFRICAN AMERICAN: 42 mL/min — AB (ref >=60)
GFR, EST NON AFRICAN AMERICAN: 36 mL/min — AB (ref >=60)
GLUCOSE: 123 mg/dL — AB (ref 65–99)
POTASSIUM: 3.9 mmol/L (ref 3.5–5.3)
Sodium: 140 mmol/L (ref 135–146)

## 2016-04-04 ENCOUNTER — Encounter (HOSPITAL_COMMUNITY): Payer: Self-pay

## 2016-04-04 DIAGNOSIS — C50212 Malignant neoplasm of upper-inner quadrant of left female breast: Secondary | ICD-10-CM | POA: Diagnosis not present

## 2016-04-07 ENCOUNTER — Telehealth: Payer: Self-pay | Admitting: *Deleted

## 2016-04-07 NOTE — Telephone Encounter (Signed)
Scheduled and confirmed appt with [name] on 04/08/16 at 1100 to discuss oncotype testing results.

## 2016-04-07 NOTE — Telephone Encounter (Signed)
Received Oncotype Dx results of 37/25%.  Gave copy to MD and took a copy to HIM to scan.

## 2016-04-08 ENCOUNTER — Telehealth: Payer: Self-pay | Admitting: Hematology and Oncology

## 2016-04-08 ENCOUNTER — Telehealth (HOSPITAL_COMMUNITY): Payer: Self-pay | Admitting: Vascular Surgery

## 2016-04-08 ENCOUNTER — Ambulatory Visit (HOSPITAL_BASED_OUTPATIENT_CLINIC_OR_DEPARTMENT_OTHER): Payer: PPO | Admitting: Hematology and Oncology

## 2016-04-08 ENCOUNTER — Other Ambulatory Visit: Payer: Self-pay | Admitting: General Surgery

## 2016-04-08 ENCOUNTER — Encounter: Payer: Self-pay | Admitting: *Deleted

## 2016-04-08 ENCOUNTER — Encounter: Payer: Self-pay | Admitting: Hematology and Oncology

## 2016-04-08 VITALS — BP 145/67 | HR 110 | Temp 97.5°F | Resp 18 | Ht 64.0 in | Wt 153.0 lb

## 2016-04-08 DIAGNOSIS — Z170 Estrogen receptor positive status [ER+]: Secondary | ICD-10-CM | POA: Diagnosis not present

## 2016-04-08 DIAGNOSIS — C50212 Malignant neoplasm of upper-inner quadrant of left female breast: Secondary | ICD-10-CM

## 2016-04-08 NOTE — Assessment & Plan Note (Signed)
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 Based on this, I strongly recommend systemic chemotherapy.  Recommendations: 1. Adjuvant chemotherapy with dose dense Adriamycin and Cytoxan 4 followed by Abraxane weekly 12 2. Adjuvant radiation therapy followed by 3. Adjuvant antiestrogen therapy  Chemotherapy Counseling: I discussed the risks and benefits of chemotherapy including the risks of nausea/ vomiting, risk of infection from low WBC count, fatigue due to chemo or anemia, bruising or bleeding due to low platelets, mouth sores, loss/ change in taste and decreased appetite. Liver and kidney function will be monitored through out chemotherapy as abnormalities in liver and kidney function may be a side effect of treatment. Cardiac dysfunction due to Adriamycin was discussed in detail. Risk of permanent bone marrow dysfunction and leukemia due to chemo were also discussed.   Return to clinic in 1 week to start chemotherapy

## 2016-04-08 NOTE — Progress Notes (Signed)
Patient Care Team: [name], MD as PCP - General Macarthur Critchley, OD as Referring Physician (Optometry) [name], MD as Consulting Physician (Dermatology) Latanya Maudlin, MD as Consulting Physician (Orthopedic Surgery) Inda Castle, MD as Consulting Physician (Gastroenterology)  DIAGNOSIS: Breast cancer of upper-inner quadrant of left female breast [facility])   Staging form: Breast, AJCC 7th Edition     Clinical stage from 03/05/2016: Stage IIA (T2, N0, M0) - Unsigned     Pathologic: Stage IIB (T3, N0, cM0) - Signed by [name], MD on 03/27/2016   SUMMARY OF ONCOLOGIC HISTORY:   Breast cancer of upper-inner quadrant of left female breast ([place])   02/26/2016 Initial Diagnosis Left breast biopsy 11:00 position: invasive ductal carcinoma with DCIS, ER 90%, PR 10%, HER-2 negative, Ki-67 30%, grade 2, 2.2 cm palpable lesion T2 N0 stage II a clinical stage   03/18/2016 Surgery Left lumpectomy: Invasive ductal carcinoma, grade 2, 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 She was found to have a score of 37 which translated into a high risk category with the risk of recurrence with tamoxifen alone 25%. From this she reports no major problems or concerns with her breasts.  REVIEW OF SYSTEMS:   Constitutional: Denies fevers, chills or abnormal weight loss Eyes: Denies blurriness of vision Ears, nose, mouth, throat, and face: Denies mucositis or sore throat Respiratory: Denies cough, dyspnea or wheezes Cardiovascular: Denies palpitation, chest discomfort Gastrointestinal:  Denies nausea, heartburn  or change in bowel habits Skin: Denies abnormal skin rashes Lymphatics: Denies new lymphadenopathy or easy bruising Neurological:Denies numbness, tingling or new weaknesses Behavioral/Psych: Mood is stable, no new changes  Extremities: No lower extremity edema Breast:  denies any pain or lumps or nodules in either breasts All other systems were reviewed with the patient and are negative.  I have reviewed the past medical history, past surgical history, social history and family history with the patient and they are unchanged from previous note.  ALLERGIES:  is allergic to keflex; meloxicam; prednisone; premarin; and trovan.  MEDICATIONS:  Current Outpatient Prescriptions  Medication Sig Dispense Refill  . allopurinol (ZYLOPRIM) 300 MG tablet TAKE 1 TABLET BY MOUTH EVERY DAY 90 tablet 1  . amitriptyline (ELAVIL) 25 MG tablet Take 1 to 2 tablets at bedtime for neuritis 180 tablet 1  . aspirin 81 MG tablet Take 81 mg by mouth daily.    Marland Kitchen atorvastatin (LIPITOR) 40 MG tablet TAKE 1 TABLET BY MOUTH EVERY DAY (Patient taking differently: TAKE 20 MG TABLET BY MOUTH EVERY DAY) 90 tablet 1  . cholecalciferol (VITAMIN D) 1000 UNITS tablet Take 1,000 Units by mouth daily.    Marland Kitchen CINNAMON PO Take 1,000 mg by mouth 2 (two) times daily.    . enalapril (VASOTEC) 20 MG tablet TAKE 1 TABLET BY MOUTH DAILY 90 tablet 1  . FREESTYLE LITE test strip USE ONE STRIP TO CHECK GLUCOSE ONCE DAILY. 100 each 0  . gabapentin (NEURONTIN) 100 MG capsule Take 1 to 3 capsules at bedtime for neuritis 90 capsule 5  . hydrochlorothiazide (HYDRODIURIL) 25 MG tablet TAKE 1 TABLET BY MOUTH  DAILY 90 tablet 3  . Magnesium 250 MG TABS Take 250 mg by mouth 2 (two) times daily. Takes 2 tabs in am and 2 tabs in pm    . Multiple Vitamin (MULTIVITAMIN) tablet Take 1 tablet by mouth daily.    . Omega-3 Fatty Acids (FISH OIL) 1200 MG CAPS Take by mouth daily. Takes 2 capsules daily    . ranitidine (ZANTAC) 300 MG tablet Take twice a day as  needed for Heartburn & Acid Indigestion 60 tablet 3  . vitamin B-12 (CYANOCOBALAMIN) 1000 MCG tablet Take 1,000 mcg by mouth daily. Takes 1 tablet every other day    . vitamin C (ASCORBIC ACID) 500 MG tablet Take 500 mg by mouth daily. Takes 2 tablets daily     No current facility-administered medications for this visit.    PHYSICAL EXAMINATION: ECOG PERFORMANCE STATUS: 1 - Symptomatic but completely ambulatory  Filed Vitals:   04/08/16 1102  BP: 145/67  Pulse: 110  Temp: 97.5 F (36.4 C)  Resp: 18   Filed Weights   04/08/16 1102  Weight: 153 lb (69.4 kg)    GENERAL:alert, no distress and comfortable SKIN: skin color, texture, turgor are normal, no rashes or significant lesions EYES: normal, Conjunctiva are pink and non-injected, sclera clear OROPHARYNX:no exudate, no erythema and lips, buccal mucosa, and tongue normal  NECK: supple, thyroid normal size, non-tender, without nodularity LYMPH:  no palpable lymphadenopathy in the cervical, axillary or inguinal LUNGS: clear to auscultation and percussion with normal breathing effort HEART: regular rate & rhythm and no murmurs and no lower extremity edema ABDOMEN:abdomen soft, non-tender and normal bowel sounds MUSCULOSKELETAL:no cyanosis of digits and no clubbing  NEURO: alert & oriented x 3 with fluent speech, no focal motor/sensory deficits EXTREMITIES: No lower extremity edema BREAST: No palpable masses or nodules in either right or left breasts. No palpable axillary supraclavicular or infraclavicular adenopathy no breast tenderness or nipple discharge. (exam performed in the presence of a chaperone)  LABORATORY DATA:  I have reviewed the data as listed   Chemistry      Component Value Date/Time   NA 140 04/02/2016 0940   NA 141 03/05/2016 0825   K 3.9 04/02/2016 0940   K 4.0 03/05/2016 0825   CL 103 04/02/2016 0940   CO2 26 04/02/2016 0940   CO2 30* 03/05/2016 0825   BUN 25 04/02/2016 0940   BUN 23.2 03/05/2016 0825     CREATININE 1.40* 04/02/2016 0940   CREATININE 1.50* 03/18/2016 1116   CREATININE 1.6* 03/05/2016 0825      Component Value Date/Time   CALCIUM 9.7 04/02/2016 0940   CALCIUM 10.1 03/05/2016 0825   ALKPHOS 115 04/02/2016 0940   ALKPHOS 84 03/05/2016 0825   AST 43* 04/02/2016 0940   AST 26 03/05/2016 0825   ALT 70* 04/02/2016 0940   ALT 28 03/05/2016 0825   BILITOT 0.4 04/02/2016 0940   BILITOT 0.40 03/05/2016 0825       Lab Results  Component Value Date   WBC 6.7 04/02/2016   HGB 12.3 04/02/2016   HCT 37.7 04/02/2016   MCV 89.5 04/02/2016   PLT 216 04/02/2016   NEUTROABS 3618 04/02/2016     ASSESSMENT & PLAN:  Breast cancer of upper-inner quadrant of left female breast ([place]) Left lumpectomy 03/08/2016: Invasive ductal carcinoma, grade 2, 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 Based on this, I strongly recommend systemic chemotherapy.  Recommendations: 1. Adjuvant chemotherapy with dose dense Adriamycin and Cytoxan 4 followed by Abraxane weekly 12 2. Adjuvant radiation therapy followed by 3. Adjuvant antiestrogen therapy  Chemotherapy Counseling: I discussed the risks and benefits of chemotherapy including the risks of nausea/ vomiting, risk of infection from low WBC count, fatigue due to chemo or anemia, bruising or bleeding due to low platelets, mouth sores, loss/ change in taste and decreased appetite. Liver and kidney function will be monitored through out chemotherapy as abnormalities in liver and kidney function may be a side effect of treatment. Cardiac dysfunction due to Adriamycin was discussed in detail. Risk of permanent bone marrow dysfunction and leukemia due to chemo were also discussed.   Return to clinic in 1 week to  start chemotherapy     No orders of the defined types were placed in this encounter.   The patient has a good understanding of the overall plan. she agrees with it. she will call with any problems that may develop before the next visit here.   Rulon Eisenmenger, MD 04/08/2016

## 2016-04-08 NOTE — Telephone Encounter (Signed)
Left pt message to make new pt appt w/ echo

## 2016-04-08 NOTE — Telephone Encounter (Signed)
lvm to inform patient of chemo class 5/16 at 930 am. Staff message sent to get echo percert. Once obtained I will sch appt and contact patient

## 2016-04-10 ENCOUNTER — Other Ambulatory Visit: Payer: Self-pay | Admitting: Hematology and Oncology

## 2016-04-10 ENCOUNTER — Encounter: Payer: Self-pay | Admitting: *Deleted

## 2016-04-10 DIAGNOSIS — C50212 Malignant neoplasm of upper-inner quadrant of left female breast: Secondary | ICD-10-CM

## 2016-04-10 MED ORDER — DEXAMETHASONE 4 MG PO TABS: 4.0000 mg | ORAL_TABLET | Freq: Every day | ORAL | Status: DC

## 2016-04-10 MED ORDER — LORAZEPAM 0.5 MG PO TABS: 0.5000 mg | ORAL_TABLET | Freq: Every day | ORAL | Status: DC

## 2016-04-10 MED ORDER — LIDOCAINE-PRILOCAINE 2.5-2.5 % EX CREA: TOPICAL_CREAM | CUTANEOUS | Status: DC

## 2016-04-10 MED ORDER — PROCHLORPERAZINE MALEATE 10 MG PO TABS
10.0000 mg | ORAL_TABLET | Freq: Four times a day (QID) | ORAL | Status: DC | PRN
Start: 2016-04-10 — End: 2016-05-30

## 2016-04-10 MED ORDER — ONDANSETRON HCL 8 MG PO TABS: 8.0000 mg | ORAL_TABLET | Freq: Two times a day (BID) | ORAL | Status: DC | PRN

## 2016-04-11 ENCOUNTER — Encounter (HOSPITAL_COMMUNITY): Payer: Self-pay | Admitting: *Deleted

## 2016-04-11 ENCOUNTER — Encounter: Payer: Self-pay | Admitting: Hematology and Oncology

## 2016-04-11 NOTE — Progress Notes (Signed)
Sent prior auth for ondansetron via covermymeds

## 2016-04-11 NOTE — Progress Notes (Addendum)
Patient is diet controlled Type II, checks CBGs  twice a week, runs 90- 120.  Patient instructed to not take Aspirn , NSAIDS, vitamins, herbal medications.

## 2016-04-13 MED ORDER — SODIUM CHLORIDE 0.9 % IV SOLN
1250.0000 mg | INTRAVENOUS | Status: AC
  Administered 2016-04-14: 1250 mg via INTRAVENOUS
  Filled 2016-04-13: qty 1250

## 2016-04-13 MED ORDER — CHLORHEXIDINE GLUCONATE 4 % EX LIQD: 1.0000 "application " | Freq: Once | CUTANEOUS | Status: DC

## 2016-04-13 NOTE — H&P (Signed)
Kayla Baker  Location: [facility] Patient #: [phone] DOB: July 12, 1936 Married / Language: English / Race: White Female         History of Present Illness  The patient is a 80 year old female who presents with breast cancer. This is a pleasant 80 year old Caucasian female in good health. She is here for her first postop visit following definitive surgery for her left breast cancer. Her husband is with her today. On March 18, 2016 she underwent left partial mastectomy, sentinel lymph node biopsy. We had do extensive tissue transfer because the tumor was large and she did not want to have a mastectomy. Final pathology report showed a 6.3 cm invasive carcinoma. We had negative margins. All 3 sentinel nodes negative. Stage T3, N0, (IIb). Receptor positive, HER-2 negative.  She is doing fairly well. Minimal discomfort. Has some bruising and still has some persistent skin discoloration. Notices some swelling in her axilla. No numbness in her arm. Good range of motion. We'll start her physical therapy exercises.  Genetic testing has been done but those results are pending. Oncotype haS been sent and is due out any day. She is scheduled to see [name] this afternoon. We are anticipating chemotherapy if the Oncotype score is high. [name] and the patient have discussed her pathology and Oncotype score and have decided to proceed with chemotherapy.  She will be scheduled for Port-A-Cath insertion.  She's aware of the indications, techniques, and numerous risk of this procedure.  We are anticipating proceeding with radiation therapy later.  Comorbidities include history TAH and BSO.  Diabetes with neuropathy.  Hypertension.  Borderline chronic kidney disease.  Hyperlipidemia. Family history reveals sister had breast cancer at age 72. Socially she is married.  They have no children.  Denies alcohol or tobacco.  We discussed her pathology report. We discussed  symmetry issues. She is able to wear her regular brought but obviously lost some volume, as anticipated. Contour is pretty good however. Better than I expected. We discussed possible plastic surgical consultation down the road and she will think about this. For now she is doing just fine.   Allergies  No Known Drug Allergies  Medication History  Allopurinol (300MG Tablet, Oral) Active. No Current Medications (Taken starting 04/08/2016) Amitriptyline HCl (25MG Tablet, Oral) Active. Atorvastatin Calcium (40MG Tablet, Oral) Active. Enalapril Maleate (20MG Tablet, Oral) Active. Gabapentin (100MG Capsule, Oral) Active. HydroCHLOROthiazide (25MG Tablet, Oral) Active. RaNITidine HCl (300MG Tablet, Oral) Active. Hydrocodone-Acetaminophen (5-325MG Tablet, Oral as needed) Active. Medications Reconciled  Vitals  Weight: 148 lb Height: 64in Body Surface Area: 1.72 m Body Mass Index: 25.4 kg/m  Temp.: 33F(Temporal)  Pulse: 75 (Regular)  BP: 134/72 (Sitting, Left Arm, Standard)    Physical Exam  General Note: Very pleasant. No distress. Husband is with her throughout the encounter. Ambulates independently  Heart:  RRR, no ectopy  Lungs:  Clear bilaterally  Breast Note: Transverse lumpectomy incision left breast well healed. There is still a little bit of residual bruising but it appears to be resolving. Minimal hematoma in the breast. Slight volume loss superiorly. Left breast is a little less ptotic but it is not dramatic. Left axillary incision healing well. It was somewhat swollen but not tender. After alcohol prep aspirated this area but there was no fluid or blood. Seems to be edema. Excellent range of motion left shoulder. No sensory deficit under left arm. No arm swelling.     Assessment & Plan  BREAST CANCER OF UPPER-INNER QUADRANT  OF LEFT FEMALE BREAST (C50.212) Current Plans . You are recovering from your left breast lumpectomy and sentinel  node biopsy without any obvious surgical complications. We have reviewed the pathology showing a 6.3 cm tumor with negative margins and negative nodes. This is much larger than was predicted by the radiology test. The good news is that you do not need any further surgery The swelling in your left axilla is tissue edema which should go away in a month or 2. We stuck a needle in this and there was no fluid. We are still waiting to hear the results of your Oncotype test. You are going to see [name] today We are waiting for the results of your genetic testing  You and [name] plan chemotherapy. You will be scheduled for Port implantation.. If not, the next step is probably radiation therapy.  Proceed with the physical therapy exercises that you have been given Return to see Dr. Dalbert Batman in 6 weeks.  TYPE 2 DIABETES MELLITUS WITHOUT COMPLICATION, WITHOUT LONG-TERM CURRENT USE OF INSULIN (E11.9) HYPERTENSION, BENIGN (I10) HISTORY OF ABDOMINAL HYSTERECTOMY (Z90.710) DIABETIC PERIPHERAL NEUROPATHY (E11.42) GOUT, ARTHRITIS (M10.9) FAMILY HISTORY OF BREAST CANCER (Z80.3)  Kayla Baker M. Dalbert Batman, M.D., [facility] General and Minimally invasive Surgery Breast and Colorectal Surgery Office:   [phone] Pager:   [phone]

## 2016-04-14 ENCOUNTER — Ambulatory Visit (HOSPITAL_COMMUNITY): Payer: PPO | Admitting: Anesthesiology

## 2016-04-14 ENCOUNTER — Ambulatory Visit (HOSPITAL_COMMUNITY): Payer: PPO

## 2016-04-14 ENCOUNTER — Encounter (HOSPITAL_COMMUNITY): Payer: Self-pay | Admitting: *Deleted

## 2016-04-14 ENCOUNTER — Encounter (HOSPITAL_COMMUNITY)
Admission: RE | Disposition: A | Discharge status: Home / Self Care | Payer: Self-pay | Source: Ambulatory Visit | Attending: General Surgery

## 2016-04-14 ENCOUNTER — Telehealth: Payer: Self-pay | Admitting: *Deleted

## 2016-04-14 ENCOUNTER — Ambulatory Visit (HOSPITAL_COMMUNITY)
Admission: RE | Admit: 2016-04-14 | Discharge: 2016-04-14 | Disposition: A | Discharge status: Home / Self Care | Payer: PPO | Source: Ambulatory Visit | Attending: General Surgery | Admitting: General Surgery

## 2016-04-14 DIAGNOSIS — K219 Gastro-esophageal reflux disease without esophagitis: Secondary | ICD-10-CM | POA: Diagnosis not present

## 2016-04-14 DIAGNOSIS — E785 Hyperlipidemia, unspecified: Secondary | ICD-10-CM | POA: Diagnosis not present

## 2016-04-14 DIAGNOSIS — C50212 Malignant neoplasm of upper-inner quadrant of left female breast: Secondary | ICD-10-CM | POA: Insufficient documentation

## 2016-04-14 DIAGNOSIS — I10 Essential (primary) hypertension: Secondary | ICD-10-CM | POA: Insufficient documentation

## 2016-04-14 DIAGNOSIS — Z170 Estrogen receptor positive status [ER+]: Secondary | ICD-10-CM | POA: Diagnosis not present

## 2016-04-14 DIAGNOSIS — Z853 Personal history of malignant neoplasm of breast: Secondary | ICD-10-CM | POA: Diagnosis present

## 2016-04-14 DIAGNOSIS — M109 Gout, unspecified: Secondary | ICD-10-CM | POA: Insufficient documentation

## 2016-04-14 DIAGNOSIS — Z79899 Other long term (current) drug therapy: Secondary | ICD-10-CM | POA: Diagnosis not present

## 2016-04-14 DIAGNOSIS — E1140 Type 2 diabetes mellitus with diabetic neuropathy, unspecified: Secondary | ICD-10-CM | POA: Insufficient documentation

## 2016-04-14 DIAGNOSIS — Z803 Family history of malignant neoplasm of breast: Secondary | ICD-10-CM | POA: Insufficient documentation

## 2016-04-14 DIAGNOSIS — Z452 Encounter for adjustment and management of vascular access device: Secondary | ICD-10-CM | POA: Diagnosis not present

## 2016-04-14 DIAGNOSIS — J9811 Atelectasis: Secondary | ICD-10-CM | POA: Diagnosis not present

## 2016-04-14 DIAGNOSIS — Z95828 Presence of other vascular implants and grafts: Secondary | ICD-10-CM

## 2016-04-14 DIAGNOSIS — D649 Anemia, unspecified: Secondary | ICD-10-CM | POA: Diagnosis not present

## 2016-04-14 DIAGNOSIS — Z419 Encounter for procedure for purposes other than remedying health state, unspecified: Secondary | ICD-10-CM

## 2016-04-14 HISTORY — PX: PORTACATH PLACEMENT: SHX2246

## 2016-04-14 LAB — GLUCOSE, CAPILLARY
GLUCOSE-CAPILLARY: 100 mg/dL — AB (ref 65–99)
GLUCOSE-CAPILLARY: 101 mg/dL — AB (ref 65–99)

## 2016-04-14 SURGERY — INSERTION, TUNNELED CENTRAL VENOUS DEVICE, WITH PORT
Anesthesia: General | Site: Chest

## 2016-04-14 MED ORDER — FENTANYL CITRATE (PF) 250 MCG/5ML IJ SOLN
INTRAMUSCULAR | Status: DC | PRN
  Administered 2016-04-14: 100 ug via INTRAVENOUS

## 2016-04-14 MED ORDER — OXYCODONE HCL 5 MG PO TABS
ORAL_TABLET | ORAL | Status: AC
  Administered 2016-04-14: 10 mg via ORAL
  Filled 2016-04-14: qty 2

## 2016-04-14 MED ORDER — ONDANSETRON HCL 4 MG/2ML IJ SOLN
INTRAMUSCULAR | Status: DC | PRN
  Administered 2016-04-14: 4 mg via INTRAVENOUS

## 2016-04-14 MED ORDER — SODIUM CHLORIDE 0.9% FLUSH: 3.0000 mL | Freq: Two times a day (BID) | INTRAVENOUS | Status: DC

## 2016-04-14 MED ORDER — FENTANYL CITRATE (PF) 100 MCG/2ML IJ SOLN: 25.0000 ug | INTRAMUSCULAR | Status: DC | PRN

## 2016-04-14 MED ORDER — MIDAZOLAM HCL 5 MG/5ML IJ SOLN
INTRAMUSCULAR | Status: DC | PRN
  Administered 2016-04-14: 2 mg via INTRAVENOUS

## 2016-04-14 MED ORDER — PROPOFOL 10 MG/ML IV BOLUS
INTRAVENOUS | Status: AC
  Filled 2016-04-14: qty 20

## 2016-04-14 MED ORDER — SODIUM CHLORIDE 0.9 % IV SOLN
INTRAVENOUS | Status: DC | PRN
  Administered 2016-04-14: 12:00:00

## 2016-04-14 MED ORDER — SODIUM CHLORIDE 0.9 % IV SOLN: INTRAVENOUS | Status: DC

## 2016-04-14 MED ORDER — LACTATED RINGERS IV SOLN: 125.0000 mL | INTRAVENOUS | Status: DC

## 2016-04-14 MED ORDER — ACETAMINOPHEN 325 MG PO TABS: 650.0000 mg | ORAL_TABLET | ORAL | Status: DC | PRN

## 2016-04-14 MED ORDER — HEPARIN SOD (PORK) LOCK FLUSH 100 UNIT/ML IV SOLN
INTRAVENOUS | Status: AC
  Filled 2016-04-14: qty 5

## 2016-04-14 MED ORDER — MEPERIDINE HCL 25 MG/ML IJ SOLN: 6.2500 mg | INTRAMUSCULAR | Status: DC | PRN

## 2016-04-14 MED ORDER — 0.9 % SODIUM CHLORIDE (POUR BTL) OPTIME
TOPICAL | Status: DC | PRN
  Administered 2016-04-14: 1000 mL

## 2016-04-14 MED ORDER — MIDAZOLAM HCL 2 MG/2ML IJ SOLN
INTRAMUSCULAR | Status: AC
  Filled 2016-04-14: qty 2

## 2016-04-14 MED ORDER — SODIUM CHLORIDE 0.9% FLUSH: 3.0000 mL | INTRAVENOUS | Status: DC | PRN

## 2016-04-14 MED ORDER — CHLORHEXIDINE GLUCONATE 4 % EX LIQD: 1.0000 "application " | Freq: Once | CUTANEOUS | Status: DC

## 2016-04-14 MED ORDER — PROPOFOL 10 MG/ML IV BOLUS
INTRAVENOUS | Status: DC | PRN
  Administered 2016-04-14: 140 mg via INTRAVENOUS

## 2016-04-14 MED ORDER — LIDOCAINE-EPINEPHRINE (PF) 1 %-1:200000 IJ SOLN
INTRAMUSCULAR | Status: DC | PRN
  Administered 2016-04-14: 30 mL

## 2016-04-14 MED ORDER — FENTANYL CITRATE (PF) 250 MCG/5ML IJ SOLN
INTRAMUSCULAR | Status: AC
  Filled 2016-04-14: qty 5

## 2016-04-14 MED ORDER — HEPARIN SOD (PORK) LOCK FLUSH 100 UNIT/ML IV SOLN
INTRAVENOUS | Status: DC | PRN
  Administered 2016-04-14: 500 [IU] via INTRAVENOUS

## 2016-04-14 MED ORDER — LIDOCAINE 2% (20 MG/ML) 5 ML SYRINGE
INTRAMUSCULAR | Status: AC
  Filled 2016-04-14: qty 5

## 2016-04-14 MED ORDER — LIDOCAINE-EPINEPHRINE (PF) 1 %-1:200000 IJ SOLN
INTRAMUSCULAR | Status: AC
  Filled 2016-04-14: qty 30

## 2016-04-14 MED ORDER — ONDANSETRON HCL 4 MG/2ML IJ SOLN
INTRAMUSCULAR | Status: AC
  Filled 2016-04-14: qty 2

## 2016-04-14 MED ORDER — LIDOCAINE 2% (20 MG/ML) 5 ML SYRINGE
INTRAMUSCULAR | Status: DC | PRN
  Administered 2016-04-14: 100 mg via INTRAVENOUS

## 2016-04-14 MED ORDER — LACTATED RINGERS IV SOLN
INTRAVENOUS | Status: DC
  Administered 2016-04-14: 11:00:00 via INTRAVENOUS

## 2016-04-14 MED ORDER — IOPAMIDOL (ISOVUE-300) INJECTION 61%
INTRAVENOUS | Status: AC
  Filled 2016-04-14: qty 50

## 2016-04-14 MED ORDER — METOCLOPRAMIDE HCL 5 MG/ML IJ SOLN: 10.0000 mg | Freq: Once | INTRAMUSCULAR | Status: DC | PRN

## 2016-04-14 MED ORDER — SODIUM CHLORIDE 0.9 % IV SOLN: 250.0000 mL | INTRAVENOUS | Status: DC | PRN

## 2016-04-14 MED ORDER — OXYCODONE HCL 5 MG PO TABS
5.0000 mg | ORAL_TABLET | ORAL | Status: DC | PRN
  Administered 2016-04-14: 10 mg via ORAL

## 2016-04-14 MED ORDER — DEXAMETHASONE SODIUM PHOSPHATE 10 MG/ML IJ SOLN
INTRAMUSCULAR | Status: DC | PRN
  Administered 2016-04-14: 6 mg via INTRAVENOUS

## 2016-04-14 MED ORDER — ACETAMINOPHEN 650 MG RE SUPP: 650.0000 mg | RECTAL | Status: DC | PRN

## 2016-04-14 SURGICAL SUPPLY — 54 items
BAG DECANTER FOR FLEXI CONT (MISCELLANEOUS) ×3 IMPLANT
BLADE SURG 11 STRL SS (BLADE) ×3 IMPLANT
BLADE SURG 15 STRL LF DISP TIS (BLADE) ×1 IMPLANT
BLADE SURG 15 STRL SS (BLADE) ×2
BLADE SURG ROTATE 9660 (MISCELLANEOUS) IMPLANT
CANISTER SUCTION 2500CC (MISCELLANEOUS) IMPLANT
CHLORAPREP W/TINT 26ML (MISCELLANEOUS) ×3 IMPLANT
COVER SURGICAL LIGHT HANDLE (MISCELLANEOUS) ×3 IMPLANT
COVER TRANSDUCER ULTRASND GEL (DRAPE) IMPLANT
CRADLE DONUT ADULT HEAD (MISCELLANEOUS) ×3 IMPLANT
DERMABOND ADVANCED (GAUZE/BANDAGES/DRESSINGS) ×2
DERMABOND ADVANCED .7 DNX12 (GAUZE/BANDAGES/DRESSINGS) ×1 IMPLANT
DRAPE C-ARM 42X72 X-RAY (DRAPES) ×3 IMPLANT
DRAPE LAPAROSCOPIC ABDOMINAL (DRAPES) ×3 IMPLANT
DRAPE UTILITY XL STRL (DRAPES) ×6 IMPLANT
ELECT CAUTERY BLADE 6.4 (BLADE) ×3 IMPLANT
ELECT REM PT RETURN 9FT ADLT (ELECTROSURGICAL) ×3
ELECTRODE REM PT RTRN 9FT ADLT (ELECTROSURGICAL) ×1 IMPLANT
GAUZE SPONGE 4X4 16PLY XRAY LF (GAUZE/BANDAGES/DRESSINGS) ×3 IMPLANT
GLOVE BIO SURGEON STRL SZ 6.5 (GLOVE) ×2 IMPLANT
GLOVE BIO SURGEON STRL SZ8 (GLOVE) ×3 IMPLANT
GLOVE BIO SURGEONS STRL SZ 6.5 (GLOVE) ×1
GLOVE BIOGEL PI IND STRL 6.5 (GLOVE) ×1 IMPLANT
GLOVE BIOGEL PI INDICATOR 6.5 (GLOVE) ×2
GLOVE EUDERMIC 7 POWDERFREE (GLOVE) ×3 IMPLANT
GOWN STRL REUS W/ TWL LRG LVL3 (GOWN DISPOSABLE) ×1 IMPLANT
GOWN STRL REUS W/ TWL XL LVL3 (GOWN DISPOSABLE) ×2 IMPLANT
GOWN STRL REUS W/TWL LRG LVL3 (GOWN DISPOSABLE) ×2
GOWN STRL REUS W/TWL XL LVL3 (GOWN DISPOSABLE) ×4
INTRODUCER 13FR (MISCELLANEOUS) IMPLANT
INTRODUCER COOK 11FR (CATHETERS) IMPLANT
KIT BASIN OR (CUSTOM PROCEDURE TRAY) ×3 IMPLANT
KIT PORT POWER 8FR ISP CVUE (Catheter) ×3 IMPLANT
KIT ROOM TURNOVER OR (KITS) ×3 IMPLANT
NEEDLE HYPO 25GX1X1/2 BEV (NEEDLE) ×6 IMPLANT
NS IRRIG 1000ML POUR BTL (IV SOLUTION) ×3 IMPLANT
PACK SURGICAL SETUP 50X90 (CUSTOM PROCEDURE TRAY) ×3 IMPLANT
PAD ARMBOARD 7.5X6 YLW CONV (MISCELLANEOUS) ×3 IMPLANT
PENCIL BUTTON HOLSTER BLD 10FT (ELECTRODE) ×3 IMPLANT
SET INTRODUCER 12FR PACEMAKER (SHEATH) IMPLANT
SET SHEATH INTRODUCER 10FR (MISCELLANEOUS) IMPLANT
SHEATH COOK PEEL AWAY SET 9F (SHEATH) IMPLANT
SURGILUBE 3G PEEL PACK STRL (MISCELLANEOUS) IMPLANT
SUT MNCRL AB 4-0 PS2 18 (SUTURE) ×3 IMPLANT
SUT PROLENE 2 0 CT2 30 (SUTURE) ×3 IMPLANT
SUT VIC AB 3-0 SH 18 (SUTURE) ×3 IMPLANT
SYR 5ML LUER SLIP (SYRINGE) ×3 IMPLANT
SYR CONTROL 10ML LL (SYRINGE) ×3 IMPLANT
SYRINGE 10CC LL (SYRINGE) ×6 IMPLANT
TOWEL OR 17X24 6PK STRL BLUE (TOWEL DISPOSABLE) ×3 IMPLANT
TOWEL OR 17X26 10 PK STRL BLUE (TOWEL DISPOSABLE) ×3 IMPLANT
TUBE CONNECTING 12'X1/4 (SUCTIONS) ×1
TUBE CONNECTING 12X1/4 (SUCTIONS) ×2 IMPLANT
YANKAUER SUCT BULB TIP NO VENT (SUCTIONS) ×3 IMPLANT

## 2016-04-14 NOTE — Transfer of Care (Signed)
Immediate Anesthesia Transfer of Care Note  Patient: Kayla Baker  Procedure(s) Performed: Procedure(s): INSERTION PORT-A-CATH WITH [place] (N/A)  Patient Location: PACU  Anesthesia Type:General  Level of Consciousness: awake  Airway & Oxygen Therapy: Patient Spontanous Breathing and Patient connected to nasal cannula oxygen  Post-op Assessment: Report given to RN, Post -op Vital signs reviewed and stable and Patient moving all extremities  Post vital signs: Reviewed and stable  Last Vitals:  Filed Vitals:   04/14/16 1058  BP: 157/77  Pulse: 94  Temp: 36.6 C  Resp: 18    Last Pain: There were no vitals filed for this visit.    Patients Stated Pain Goal: 4 (84/72/07 2182)  Complications: No apparent anesthesia complications

## 2016-04-14 NOTE — Anesthesia Procedure Notes (Signed)
Procedure Name: LMA Insertion Date/Time: 04/14/2016 12:01 PM Performed by: Melina Copa, Lenna Hagarty R Pre-anesthesia Checklist: Patient identified, Emergency Drugs available, Suction available and Patient being monitored Patient Re-evaluated:Patient Re-evaluated prior to inductionOxygen Delivery Method: Circle System Utilized Preoxygenation: Pre-oxygenation with 100% oxygen Intubation Type: IV induction Ventilation: Mask ventilation without difficulty LMA: LMA inserted LMA Size: 4.0 Number of attempts: 1 Placement Confirmation: positive ETCO2 Tube secured with: Tape Dental Injury: Teeth and Oropharynx as per pre-operative assessment

## 2016-04-14 NOTE — Discharge Instructions (Signed)
Use Tylenol for pain  If that does not work, use the hydrocodone that you already have at home        PORT-A-CATH: POST OP INSTRUCTIONS  Always review your discharge instruction sheet given to you by the facility where your surgery was performed.   1. A prescription for pain medication may be given to you upon discharge. Take your pain medication as prescribed, if needed. If narcotic pain medicine is not needed, then you make take acetaminophen (Tylenol) or ibuprofen (Advil) as needed.  2. Take your usually prescribed medications unless otherwise directed. 3. If you need a refill on your pain medication, please contact our office. All narcotic pain medicine now requires a paper prescription.  Phoned in and fax refills are no longer allowed by law.  Prescriptions will not be filled after 5 pm or on weekends.  4. You should follow a light diet for the remainder of the day after your procedure. 5. Most patients will experience some mild swelling and/or bruising in the area of the incision. It may take several days to resolve. 6. It is common to experience some constipation if taking pain medication after surgery. Increasing fluid intake and taking a stool softener (such as Colace) will usually help or prevent this problem from occurring. A mild laxative (Milk of Magnesia or Miralax) should be taken according to package directions if there are no bowel movements after 48 hours.  7. Unless discharge instructions indicate otherwise, you may remove your bandages 48 hours after surgery, and you may shower at that time. You may have steri-strips (small white skin tapes) in place directly over the incision.  These strips should be left on the skin for 7-10 days.  If your surgeon used Dermabond (skin glue) on the incision, you may shower in 24 hours.  The glue will flake off over the next 2-3 weeks.  8. If your port is left accessed at the end of surgery (needle left in port), the dressing cannot get wet  and should only by changed by a healthcare professional. When the port is no longer accessed (when the needle has been removed), follow step 7.   9. ACTIVITIES:  Limit activity involving your arms for the next 72 hours. Do no strenuous exercise or activity for 1 week. You may drive when you are no longer taking prescription pain medication, you can comfortably wear a seatbelt, and you can maneuver your car. 10.You may need to see your doctor in the office for a follow-up appointment.  Please       check with your doctor.  11.When you receive a new Port-a-Cath, you will get a product guide and        ID card.  Please keep them in case you need them.  WHEN TO CALL YOUR DOCTOR [phone]): 1. Fever over 101.0 2. Chills 3. Continued bleeding from incision 4. Increased redness and tenderness at the site 5. Shortness of breath, difficulty breathing   The clinic staff is available to answer your questions during regular business hours. Please dont hesitate to call and ask to speak to one of the nurses or medical assistants for clinical concerns. If you have a medical emergency, go to the nearest emergency room or call 911.  A surgeon from [facility] is always on call at the hospital.     For further information, please visit [email or web address]

## 2016-04-14 NOTE — Anesthesia Preprocedure Evaluation (Addendum)
Anesthesia Evaluation  Patient identified by MRN, date of birth, ID band Patient awake    Reviewed: Allergy & Precautions, H&P , NPO status , Patient's Chart, lab work & pertinent test results, reviewed documented beta blocker date and time   Airway Mallampati: II  TM Distance: >3 FB Neck ROM: full    Dental no notable dental hx. (+) Dental Advidsory Given, Caps   Pulmonary neg pulmonary ROS,    Pulmonary exam normal breath sounds clear to auscultation       Cardiovascular hypertension, On Medications  Rhythm:regular Rate:Normal     Neuro/Psych  Neuromuscular disease negative psych ROS   GI/Hepatic Neg liver ROS, GERD  Medicated and Controlled,  Endo/Other  diabetes  Renal/GU negative Renal ROS  negative genitourinary   Musculoskeletal negative musculoskeletal ROS (+)   Abdominal   Peds negative pediatric ROS (+)  Hematology negative hematology ROS (+)   Anesthesia Other Findings   Reproductive/Obstetrics negative OB ROS                            Anesthesia Physical  Anesthesia Plan  ASA: II  Anesthesia Plan:    Post-op Pain Management:    Induction: Intravenous  Airway Management Planned: LMA  Additional Equipment:   Intra-op Plan:   Post-operative Plan:   Informed Consent: I have reviewed the patients History and Physical, chart, labs and discussed the procedure including the risks, benefits and alternatives for the proposed anesthesia with the patient or authorized representative who has indicated his/her understanding and acceptance.   Dental Advisory Given and Dental advisory given  Plan Discussed with: CRNA and Surgeon  Anesthesia Plan Comments:        Anesthesia Quick Evaluation

## 2016-04-14 NOTE — Telephone Encounter (Signed)
Left vm for pt to return call to discuss anti-nausea medication and emla placement

## 2016-04-14 NOTE — Anesthesia Postprocedure Evaluation (Signed)
Anesthesia Post Note  Patient: Kayla Baker  Procedure(s) Performed: Procedure(s) (LRB): INSERTION PORT-A-CATH  (N/A)  Patient location during evaluation: PACU Anesthesia Type: General Level of consciousness: awake and alert Pain management: pain level controlled Vital Signs Assessment: post-procedure vital signs reviewed and stable Respiratory status: spontaneous breathing, nonlabored ventilation and respiratory function stable Cardiovascular status: blood pressure returned to baseline and stable Postop Assessment: no signs of nausea or vomiting Anesthetic complications: no    Last Vitals:  Filed Vitals:   04/14/16 1345 04/14/16 1350  BP:  144/77  Pulse: 73 74  Temp: 36.7 C   Resp: 18 18    Last Pain:  Filed Vitals:   04/14/16 1351  PainSc: 0-No pain                 Marianela Mandrell A

## 2016-04-14 NOTE — Interval H&P Note (Signed)
History and Physical Interval Note:  04/14/2016 11:36 AM  Kayla Baker  has presented today for surgery, with the diagnosis of LEFT BREAST CANCER  The various methods of treatment have been discussed with the patient and family. After consideration of risks, benefits and other options for treatment, the patient has consented to  Procedure(s): INSERTION PORT-A-CATH WITH [place] (N/A) as a surgical intervention .  The patient's history has been reviewed, patient examined, no change in status, stable for surgery.  I have reviewed the patient's chart and labs.  Questions were answered to the patient's satisfaction.     [name]

## 2016-04-14 NOTE — Op Note (Signed)
Patient Name:           Kayla Baker   Date of Surgery:        04/14/2016  Pre op Diagnosis:      Cancer left breast, stage T3,N0 (IIB),, receptor positive HER-2 negative  Post op Diagnosis:    same  Procedure:                 Insertion 8 French power port ClearVue tunneled venous vascular access device                                      Use of fluoroscopy for guidance and positioning  Surgeon:                     Edsel Petrin. Dalbert Batman, M.D., FACS  Assistant:                      OR staff  Operative Indications: This is a pleasant 80 year old Caucasian female who recently underwent definitive surgery for cancer of the left breast. On March 18, 2016 she underwent left partial mastectomy, sentinel lymph node biopsy, and extensive tissue transfer because the tumor was large. She did not want to have a mastectomy. Final pathology report showed a 6.3 cm invasive carcinoma. Larger than predicted by imaging studies.  We had negative margins. All 3 sentinel nodes negative. Stage T3, N0, (IIb). Receptor positive, HER-2 negative.     She is doing fairly well. Minimal discomfort. She has had genetic testing which is pending.  She has seen [name] and has had Oncotype scoring and he has recommended chemotherapy and referred her back for Port-A-Cath.  She has consented to this.  I discussed the indications, details, techniques, and numerous risk of port insertion.  She is aware of the risk of bleeding, infection, pneumothorax, air embolus, vascular injury, malfunction requiring revision, and other unforeseen problems.  She understands these issues well.  All of her questions are answered.  She agrees with this plan..  Genetic testing has been done but those results are pending. Oncotype haS been sent and is due out any day. She is scheduled to see [name] this afternoon. We are anticipating chemotherapy if the Oncotype score is high. [name] and the patient have discussed her pathology and  Oncotype score and have decided to proceed with chemotherapy. She will be scheduled for Port-A-Cath insertion. She's aware of the indications, techniques, and numerous risk of this procedure. We are anticipating proceeding with radiation therapy later. Marland Kitchen  Operative Findings:       The port was placed through the right subclavian vein.  The procedure was relatively uneventful.  We had excellent blood return and it flushed easily.  C-arm imaging showed the catheter to be well-positioned in the superior vena cava without any deformity of the catheter along its course.  Procedure in Detail:          Following the induction of general LMA anesthesia the patient was positioned with a small roll behind her shoulders and her arms at her sides.  The neck and chest was prepped and draped in a sterile fashion.  Surgical timeout was performed.  Intravenous antibiotics were given.  1% Xylocaine with epinephrine was used as local anesthesia.     A right subclavian venipuncture was performed with a single pass and a guidewire inserted into the  superior vena cava under fluoroscopic guidance.  A small incision was made at the wire insertion site.  Using the C-arm I drew a template on the chest wall to guide catheter length and positioning to the cavoatrial junction.  Transverse incision was made about 2 cm below the mid clavicle.  Some subcutaneous tissue was debrided.  Subcutaneous pocket was created.  Using a tunneling device I passed the catheter from the wire insertion site to the port pocket site.  Using the template that I drew on the chest wall I measured and cut the catheter 20 cm.  The catheter was secured to the port with the locking device and the entire assembly flushed with heparinized saline.  The port was sutured to the pectoralis fascia with 3 interrupted sutures of 2-0 Prolene.  The dilator and peel-away sheath was inserted over the guidewire into the central venous circulation and the dilator and guidewire  removed.  The catheter was threaded through the sheath and the sheath removed.  The catheter flushed well and had good blood return.  Ultimately the port and catheter were flushed with Concentrated heparin.  Fluoroscopy confirmed good positioning of the catheter tip in the superior vena cava just above the right atrium.    There was no bleeding from the incisions.  The subcutaneous tissue was closed with 3-0 Vicryl sutures and the skin incisions closed with subcuticular 4-0 Monocryl and Dermabond.  Patient tolerated the procedure well was taken to PACU in stable condition where a chest x-ray will be obtained.  EBL 10 mL.  Counts correct.  Complications none.     Edsel Petrin. Dalbert Batman, M.D., FACS General and Minimally Invasive Surgery Breast and Colorectal Surgery  04/14/2016 12:55 PM

## 2016-04-15 ENCOUNTER — Encounter (HOSPITAL_COMMUNITY): Payer: Self-pay | Admitting: General Surgery

## 2016-04-15 ENCOUNTER — Encounter: Payer: Self-pay | Admitting: *Deleted

## 2016-04-15 ENCOUNTER — Other Ambulatory Visit: Payer: PPO

## 2016-04-16 ENCOUNTER — Ambulatory Visit (HOSPITAL_COMMUNITY)
Admission: RE | Admit: 2016-04-16 | Discharge: 2016-04-16 | Disposition: A | Discharge status: Home / Self Care | Payer: PPO | Source: Ambulatory Visit | Attending: Internal Medicine | Admitting: Internal Medicine

## 2016-04-16 ENCOUNTER — Encounter (HOSPITAL_COMMUNITY): Payer: Self-pay | Admitting: Internal Medicine

## 2016-04-16 ENCOUNTER — Ambulatory Visit (HOSPITAL_BASED_OUTPATIENT_CLINIC_OR_DEPARTMENT_OTHER)
Admission: RE | Admit: 2016-04-16 | Discharge: 2016-04-16 | Disposition: A | Discharge status: Home / Self Care | Payer: PPO | Source: Ambulatory Visit | Attending: Internal Medicine | Admitting: Internal Medicine

## 2016-04-16 ENCOUNTER — Encounter: Payer: Self-pay | Admitting: Internal Medicine

## 2016-04-16 VITALS — BP 142/80 | HR 74 | Wt 156.5 lb

## 2016-04-16 DIAGNOSIS — E559 Vitamin D deficiency, unspecified: Secondary | ICD-10-CM | POA: Diagnosis not present

## 2016-04-16 DIAGNOSIS — K219 Gastro-esophageal reflux disease without esophagitis: Secondary | ICD-10-CM | POA: Insufficient documentation

## 2016-04-16 DIAGNOSIS — Z823 Family history of stroke: Secondary | ICD-10-CM | POA: Diagnosis not present

## 2016-04-16 DIAGNOSIS — M109 Gout, unspecified: Secondary | ICD-10-CM | POA: Diagnosis not present

## 2016-04-16 DIAGNOSIS — E1142 Type 2 diabetes mellitus with diabetic polyneuropathy: Secondary | ICD-10-CM | POA: Insufficient documentation

## 2016-04-16 DIAGNOSIS — I10 Essential (primary) hypertension: Secondary | ICD-10-CM

## 2016-04-16 DIAGNOSIS — Z801 Family history of malignant neoplasm of trachea, bronchus and lung: Secondary | ICD-10-CM | POA: Insufficient documentation

## 2016-04-16 DIAGNOSIS — Z79899 Other long term (current) drug therapy: Secondary | ICD-10-CM | POA: Insufficient documentation

## 2016-04-16 DIAGNOSIS — Z8249 Family history of ischemic heart disease and other diseases of the circulatory system: Secondary | ICD-10-CM | POA: Diagnosis not present

## 2016-04-16 DIAGNOSIS — Z833 Family history of diabetes mellitus: Secondary | ICD-10-CM | POA: Diagnosis not present

## 2016-04-16 DIAGNOSIS — Z170 Estrogen receptor positive status [ER+]: Secondary | ICD-10-CM | POA: Insufficient documentation

## 2016-04-16 DIAGNOSIS — Z7982 Long term (current) use of aspirin: Secondary | ICD-10-CM | POA: Diagnosis not present

## 2016-04-16 DIAGNOSIS — Z881 Allergy status to other antibiotic agents status: Secondary | ICD-10-CM | POA: Diagnosis not present

## 2016-04-16 DIAGNOSIS — C50212 Malignant neoplasm of upper-inner quadrant of left female breast: Secondary | ICD-10-CM

## 2016-04-16 DIAGNOSIS — E785 Hyperlipidemia, unspecified: Secondary | ICD-10-CM | POA: Diagnosis not present

## 2016-04-16 DIAGNOSIS — Z888 Allergy status to other drugs, medicaments and biological substances status: Secondary | ICD-10-CM | POA: Insufficient documentation

## 2016-04-16 NOTE — Patient Instructions (Signed)
Follow up 5 weeks with echocardiogram and appointment with Dr. Haroldine Laws. 1)

## 2016-04-16 NOTE — Progress Notes (Signed)
  Echocardiogram 2D Echocardiogram has been performed.  Diamond Nickel 04/16/2016, 2:53 PM

## 2016-04-16 NOTE — Progress Notes (Signed)
Patient ID: Kayla Baker, female   DOB: 1936-09-13, 80 y.o.   MRN: [id number]    CARDIO-ONCOLOGY CLINIC CONSULT NOTE  Referring Physician: Lindi Adie   HPI:  Kayla Baker is a 80 y/o woman with HTN, HL, GERD and left breast cancer diagnosed 3/17. ER + PR- HER-2 - referred by [name] for enrollment into the [facility].    DIAGNOSIS: Breast cancer of upper-inner quadrant of left female breast Kayla Baker)  Staging form: Breast, AJCC 7th Edition  Clinical stage from 03/05/2016: Stage IIA (T2, N0, M0) - Unsigned  Pathologic: Stage IIB (T3, N0, cM0) - Signed by [name], MD on 03/27/2016   SUMMARY OF ONCOLOGIC HISTORY:   Breast cancer of upper-inner quadrant of left female breast ([place])   02/26/2016 Initial Diagnosis Left breast biopsy 11:00 position: invasive ductal carcinoma with DCIS, ER 90%, PR 10%, HER-2 negative, Ki-67 30%, grade 2, 2.2 cm palpable lesion T2 N0 stage II a clinical stage   03/18/2016 Surgery Left lumpectomy: Invasive ductal carcinoma, grade 2, 6.3 cm, with high-grade DCIS, margins negative, 0/4 lymph nodes negative, ER 90%, via 10%, HER-2 negative ratio 0.97, Ki-67 30%, T3 N0 stage IIB   04/04/2016 Oncotype testing Oncotype DX recurrence score 37, 25% 10 year distant risk of recurrence         Planned treatment:  1. Adjuvant chemotherapy with dose dense Adriamycin and Cytoxan 4 followed by Abraxane weekly 12 2. Adjuvant radiation therapy followed by 3. Adjuvant antiestrogen therapy  Denies any h/o known heart disease. Feels well. No significant exertional dyspnea or CP. No edema or palpitations.   Echo today reviewed personally:  EF 60-65% Grade I DD. Lateral s' 10.8 cm/s GLS -15.9% (underestimated due to poor tracking)   Review of Systems: [y] = yes, _0  = no   General: Weight gain _1 ; Weight loss _2 ; Anorexia _3 ; Fatigue _4 ; Fever _5 ; Chills _6 ; Weakness _7   Cardiac: Chest pain/pressure _8 ; Resting SOB _9 ; Exertional SOB _10 ;  Orthopnea _11 ; Pedal Edema _12 ; Palpitations _13 ; Syncope _14 ; Presyncope _15 ; Paroxysmal nocturnal dyspnea_16   Pulmonary: Cough _17 ; Wheezing_18 ; Hemoptysis_19 ; Sputum _20 ; Snoring _21   GI: Vomiting_22 ; Dysphagia_23 ; Melena_24 ; Hematochezia _25 ; Heartburn_26 ; Abdominal pain _27 ; Constipation _28 ; Diarrhea _29 ; BRBPR _30   GU: Hematuria_31 ; Dysuria _32 ; Nocturia_33   Vascular: Pain in legs with walking _34 ; Pain in feet with lying flat _35 ; Non-healing sores _36 ; Stroke _37 ; TIA _38 ; Slurred speech _39 ;  Neuro: Headaches_40 ; Vertigo_41 ; Seizures_42 ; Paresthesias_43 ;Blurred vision _44 ; Diplopia _45 ; Vision changes _46   Ortho/Skin: Arthritis [ y]; Joint pain [ y]; Muscle pain _47 ; Joint swelling _48 ; Back Pain _49 ; Rash _50   Psych: Depression_51 ; Anxiety_52   Heme: Bleeding problems _53 ; Clotting disorders _54 ; Anemia _55   Endocrine: Diabetes _56 ; Thyroid dysfunction_57    Past Medical History  Diagnosis Date  . Hyperlipidemia     takes Atorvastatin daily  . Gout     takes Allopurinol daily  . Allergy   . Vitamin D deficiency   . Anemia   . Arthritis   . PONV (postoperative nausea and vomiting)   . Hypertension     takes Enalapril and HCTZ daily  . Peripheral neuropathy (HCC)     takes Gabapentin and Elavil  daily  . GERD (gastroesophageal reflux disease)   . History of colon polyps     benign  . Diverticulosis   . History of shingles   . Diet-controlled type 2 diabetes mellitus ([place])     diet and exercsise,Has never been on meds  . Breast cancer of upper-inner quadrant of left female breast ([place]) 02/29/2016    skin- 2016- squamous- on right upper arm    Current Outpatient Prescriptions  Medication Sig Dispense Refill  . allopurinol (ZYLOPRIM) 300 MG tablet TAKE 1 TABLET BY MOUTH EVERY DAY 90 tablet 1  . amitriptyline (ELAVIL) 25 MG tablet Take 1 to 2 tablets at bedtime for neuritis 180 tablet 1  . aspirin 81 MG tablet Take 81 mg by mouth daily.    Marland Kitchen atorvastatin (LIPITOR) 40 MG  tablet TAKE 1 TABLET BY MOUTH EVERY DAY 90 tablet 1  . cholecalciferol (VITAMIN D) 1000 UNITS tablet Take 1,000 Units by mouth daily.    Marland Kitchen CINNAMON PO Take 1,000 mg by mouth 2 (two) times daily.    . enalapril (VASOTEC) 20 MG tablet TAKE 1 TABLET BY MOUTH DAILY 90 tablet 1  . FREESTYLE LITE test strip USE ONE STRIP TO CHECK GLUCOSE ONCE DAILY. 100 each 0  . gabapentin (NEURONTIN) 100 MG capsule Take 1 to 3 capsules at bedtime for neuritis 90 capsule 5  . hydrochlorothiazide (HYDRODIURIL) 25 MG tablet TAKE 1 TABLET BY MOUTH DAILY 90 tablet 3  . Magnesium 250 MG TABS Take 500 mg by mouth 2 (two) times daily. Takes 2 tabs in am and 2 tabs in pm    . Multiple Vitamin (MULTIVITAMIN) tablet Take 1 tablet by mouth daily.    . Omega-3 Fatty Acids (FISH OIL) 1200 MG CAPS Take 2,400 mg by mouth daily. Takes 2 capsules daily    . vitamin B-12 (CYANOCOBALAMIN) 1000 MCG tablet Take 1,000 mcg by mouth daily. Takes 1 tablet every other day    . vitamin C (ASCORBIC ACID) 500 MG tablet Take 500 mg by mouth daily. Takes 2 tablets daily    . chlorhexidine (HIBICLENS) 4 % external liquid Apply 15 mLs (1 application total) topically once. (Patient not taking: Reported on 04/16/2016) 120 mL 0  . chlorhexidine (HIBICLENS) 4 % external liquid Apply 15 mLs (1 application total) topically once. (Patient not taking: Reported on 04/16/2016) 120 mL 0  . dexamethasone (DECADRON) 4 MG tablet Take 1 tablet (4 mg total) by mouth daily. 1 tab daily X 3 days (Patient not taking: Reported on 04/16/2016) 30 tablet 1  . lactated ringers infusion Inject 125 mLs into the vein continuous. (Patient not taking: Reported on 04/16/2016) 250 mL 0  . lidocaine-prilocaine (EMLA) cream Apply to affected area once (Patient not taking: Reported on 04/16/2016) 30 g 3  . LORazepam (ATIVAN) 0.5 MG tablet Take 1 tablet (0.5 mg total) by mouth at bedtime. (Patient not taking: Reported on 04/16/2016) 30 tablet 0  . ondansetron (ZOFRAN) 8 MG tablet Take 1  tablet (8 mg total) by mouth 2 (two) times daily as needed. Start on the third day after chemotherapy. (Patient not taking: Reported on 04/16/2016) 30 tablet 1  . prochlorperazine (COMPAZINE) 10 MG tablet Take 1 tablet (10 mg total) by mouth every 6 (six) hours as needed (Nausea or vomiting). (Patient not taking: Reported on 04/16/2016) 30 tablet 1  . ranitidine (ZANTAC) 300 MG tablet Take twice a day as needed for Heartburn & Acid Indigestion (Patient not taking: Reported on 04/16/2016) 60 tablet 3  No current facility-administered medications for this encounter.    Allergies  Allergen Reactions  . Keflex [Cephalexin] Swelling    Tongue and throat swells  . Meloxicam Swelling  . Prednisone Swelling    Can Not take High doses  . Premarin [Estrogens Conjugated] Hives  . Trovan [Alatrofloxacin] Other (See Comments)    Unknown reaction      Social History   Social History  . Marital Status: Married    Spouse Name: N/A  . Number of Children: N/A  . Years of Education: N/A   Occupational History  . Not on file.   Social History Main Topics  . Smoking status: Never Smoker   . Smokeless tobacco: Never Used  . Alcohol Use: No  . Drug Use: No  . Sexual Activity: Not on file   Other Topics Concern  . Not on file   Social History Narrative      Family History  Problem Relation Age of Onset  . Colon cancer Neg Hx   . Stomach cancer Neg Hx   . Stroke Mother 74  . Other Mother     history of hysterectomy after last childbirth  . Diabetes Father   . Alzheimer's disease Father   . Hypertension Sister   . Lung cancer Sister 36    smoker  . Other Sister     history of hysterectomy for fibroids  . Breast cancer Sister 62  . Esophageal cancer Maternal Aunt 74    not a smoker  . Heart attack Maternal Uncle 65  . Cirrhosis Sister   . Lung cancer Other     nephew dx. 76s; +smoker  . Epilepsy Other   . Other Other 12    great niece dx. benign ganglioglioma brain tumor;  treated at [facility]  . Epilepsy Other     no seizures in 2 years  . Parkinson's disease Maternal Aunt   . Stroke Maternal Grandmother   . Heart Problems Maternal Grandmother   . Pancreatic cancer Cousin 57    paternal 1st cousin    Filed Vitals:   04/16/16 1526  BP: 142/80  Pulse: 74  Weight: 156 lb 8 oz (70.988 kg)  SpO2: 99%    PHYSICAL EXAM: General:  Well appearing. No respiratory difficulty HEENT: normal Neck: supple. no JVD.+ port Carotids 2+ bilat; no bruits. No lymphadenopathy or thryomegaly appreciated. Cor: PMI nondisplaced. Regular rate & rhythm. No rubs, gallops or murmurs. Lungs: clear Abdomen: soft, nontender, nondistended. No hepatosplenomegaly. No bruits or masses. Good bowel sounds. Extremities: no cyanosis, clubbing, rash, edema Neuro: alert & oriented x 3, cranial nerves grossly intact. moves all 4 extremities w/o difficulty. Affect pleasant.   ASSESSMENT & PLAN: 1. Left breast CA  --s/p Left lumpectomy 03/08/2016: Invasive ductal carcinoma, grade 2, 6.3 cm, with high-grade DCIS, margins negative, 0/4 lymph nodes negative, ER 90%, via 10%, HER-2 negative ratio 0.97, Ki-67 30%, T3 N0 stage II --planned therapy:     1. Adjuvant chemotherapy with dose dense Adriamycin and Cytoxan 4 followed by Abraxane weekly 12    2. Adjuvant radiation therapy    3. Adjuvant antiestrogen therapy -Explained incidence of Adriamycin cardiotoxicity and role of Cardio-oncology clinic at length. Echo images reviewed personally. All parameters stable. Reviewed signs and symptoms of HF to look for. Will recheck echo after 3/4 cycles and 3 months post treatment.  2. HTN  --mildly elevated. Followed by PCP  Trinty Marken,MD 3:57 PM

## 2016-04-17 ENCOUNTER — Telehealth: Payer: Self-pay | Admitting: *Deleted

## 2016-04-17 ENCOUNTER — Other Ambulatory Visit: Payer: Self-pay | Admitting: *Deleted

## 2016-04-17 ENCOUNTER — Ambulatory Visit (HOSPITAL_BASED_OUTPATIENT_CLINIC_OR_DEPARTMENT_OTHER): Payer: PPO | Admitting: Hematology and Oncology

## 2016-04-17 ENCOUNTER — Ambulatory Visit (HOSPITAL_BASED_OUTPATIENT_CLINIC_OR_DEPARTMENT_OTHER): Payer: PPO

## 2016-04-17 ENCOUNTER — Encounter: Payer: Self-pay | Admitting: Hematology and Oncology

## 2016-04-17 ENCOUNTER — Other Ambulatory Visit (HOSPITAL_BASED_OUTPATIENT_CLINIC_OR_DEPARTMENT_OTHER): Payer: PPO

## 2016-04-17 VITALS — BP 152/79 | HR 91 | Temp 98.4°F | Resp 18 | Wt 155.5 lb

## 2016-04-17 DIAGNOSIS — Z170 Estrogen receptor positive status [ER+]: Secondary | ICD-10-CM | POA: Diagnosis not present

## 2016-04-17 DIAGNOSIS — Z5189 Encounter for other specified aftercare: Secondary | ICD-10-CM | POA: Diagnosis not present

## 2016-04-17 DIAGNOSIS — C50212 Malignant neoplasm of upper-inner quadrant of left female breast: Secondary | ICD-10-CM

## 2016-04-17 DIAGNOSIS — Z5111 Encounter for antineoplastic chemotherapy: Secondary | ICD-10-CM

## 2016-04-17 LAB — CBC WITH DIFFERENTIAL/PLATELET
BASO%: 0.7 % (ref 0.0–2.0)
BASOS ABS: 0.1 10*3/uL (ref 0.0–0.1)
EOS%: 2.9 % (ref 0.0–7.0)
Eosinophils Absolute: 0.2 10*3/uL (ref 0.0–0.5)
HEMATOCRIT: 37.1 % (ref 34.8–46.6)
HGB: 12.4 g/dL (ref 11.6–15.9)
LYMPH%: 38.1 % (ref 14.0–49.7)
MCH: 29.9 pg (ref 25.1–34.0)
MCHC: 33.4 g/dL (ref 31.5–36.0)
MCV: 89.6 fL (ref 79.5–101.0)
MONO#: 0.8 10*3/uL (ref 0.1–0.9)
MONO%: 10.3 % (ref 0.0–14.0)
NEUT#: 3.9 10*3/uL (ref 1.5–6.5)
NEUT%: 48 % (ref 38.4–76.8)
Platelets: 182 10*3/uL (ref 145–400)
RBC: 4.14 10*6/uL (ref 3.70–5.45)
RDW: 15 % — AB (ref 11.2–14.5)
WBC: 8.2 10*3/uL (ref 3.9–10.3)
lymph#: 3.1 10*3/uL (ref 0.9–3.3)

## 2016-04-17 LAB — COMPREHENSIVE METABOLIC PANEL
ALT: 81 U/L — ABNORMAL HIGH (ref 0–55)
AST: 30 U/L (ref 5–34)
Albumin: 3.8 g/dL (ref 3.5–5.0)
Alkaline Phosphatase: 109 U/L (ref 40–150)
Anion Gap: 7 mEq/L (ref 3–11)
BUN: 25.2 mg/dL (ref 7.0–26.0)
CALCIUM: 10.2 mg/dL (ref 8.4–10.4)
CHLORIDE: 106 meq/L (ref 98–109)
CO2: 29 mEq/L (ref 22–29)
Creatinine: 1.5 mg/dL — ABNORMAL HIGH (ref 0.6–1.1)
EGFR: 32 mL/min/{1.73_m2} — ABNORMAL LOW (ref >90)
Glucose: 112 mg/dl (ref 70–140)
POTASSIUM: 4.2 meq/L (ref 3.5–5.1)
Sodium: 143 mEq/L (ref 136–145)
Total Bilirubin: 0.42 mg/dL (ref 0.20–1.20)
Total Protein: 6.8 g/dL (ref 6.4–8.3)

## 2016-04-17 MED ORDER — FOSAPREPITANT DIMEGLUMINE INJECTION 150 MG
Freq: Once | INTRAVENOUS | Status: AC
  Administered 2016-04-17: 11:00:00 via INTRAVENOUS
  Filled 2016-04-17: qty 5

## 2016-04-17 MED ORDER — PALONOSETRON HCL INJECTION 0.25 MG/5ML
INTRAVENOUS | Status: AC
  Filled 2016-04-17: qty 5

## 2016-04-17 MED ORDER — DOXORUBICIN HCL CHEMO IV INJECTION 2 MG/ML
50.0000 mg/m2 | Freq: Once | INTRAVENOUS | Status: AC
  Administered 2016-04-17: 88 mg via INTRAVENOUS
  Filled 2016-04-17: qty 44

## 2016-04-17 MED ORDER — PALONOSETRON HCL INJECTION 0.25 MG/5ML
0.2500 mg | Freq: Once | INTRAVENOUS | Status: AC
  Administered 2016-04-17: 0.25 mg via INTRAVENOUS

## 2016-04-17 MED ORDER — SODIUM CHLORIDE 0.9% FLUSH
10.0000 mL | INTRAVENOUS | Status: DC | PRN
  Administered 2016-04-17: 10 mL
  Filled 2016-04-17: qty 10

## 2016-04-17 MED ORDER — SODIUM CHLORIDE 0.9 % IV SOLN
Freq: Once | INTRAVENOUS | Status: AC
  Administered 2016-04-17: 11:00:00 via INTRAVENOUS

## 2016-04-17 MED ORDER — SODIUM CHLORIDE 0.9 % IV SOLN
500.0000 mg/m2 | Freq: Once | INTRAVENOUS | Status: AC
  Administered 2016-04-17: 880 mg via INTRAVENOUS
  Filled 2016-04-17: qty 44

## 2016-04-17 MED ORDER — HEPARIN SOD (PORK) LOCK FLUSH 100 UNIT/ML IV SOLN
500.0000 [IU] | Freq: Once | INTRAVENOUS | Status: AC | PRN
  Administered 2016-04-17: 500 [IU]
  Filled 2016-04-17: qty 5

## 2016-04-17 MED ORDER — PEGFILGRASTIM 6 MG/0.6ML ~~LOC~~ PSKT
6.0000 mg | PREFILLED_SYRINGE | Freq: Once | SUBCUTANEOUS | Status: AC
  Administered 2016-04-17: 6 mg via SUBCUTANEOUS
  Filled 2016-04-17: qty 0.6

## 2016-04-17 NOTE — Progress Notes (Signed)
Patient Care Team: [name], MD as PCP - General Macarthur Critchley, OD as Referring Physician (Optometry) [name], MD as Consulting Physician (Dermatology) Latanya Maudlin, MD as Consulting Physician (Orthopedic Surgery) Inda Castle, MD as Consulting Physician (Gastroenterology)  DIAGNOSIS: Breast cancer of upper-inner quadrant of left female breast Kayla Baker)   Staging form: Breast, AJCC 7th Edition     Clinical stage from 03/05/2016: Stage IIA (T2, N0, M0) - Unsigned     Pathologic: Stage IIB (T3, N0, cM0) - Signed by [name], MD on 03/27/2016   SUMMARY OF ONCOLOGIC HISTORY:   Breast cancer of upper-inner quadrant of left female breast (Kayla Baker)   02/26/2016 Initial Diagnosis Left breast biopsy 11:00 position: invasive ductal carcinoma with DCIS, ER 90%, PR 10%, HER-2 negative, Ki-67 30%, grade 2, 2.2 cm palpable lesion T2 N0 stage II a clinical stage   03/18/2016 Surgery Left lumpectomy: Invasive ductal carcinoma, grade 2, 6.3 cm, with high-grade DCIS, margins negative, 0/4 lymph nodes negative, ER 90%, via 10%, HER-2 negative ratio 0.97, Ki-67 30%, T3 N0 stage IIB   04/04/2016 Oncotype testing Oncotype DX recurrence score 37, 25% 10 year distant risk of recurrence   04/17/2016 -  Chemotherapy Adjuvant chemotherapy with dose dense Adriamycin and Cytoxan followed by Abraxane weekly 12    CHIEF COMPLIANT: Cycle 1 day 1 dose dense Adriamycin and Cytoxan  INTERVAL HISTORY: Kayla Baker is a 80 year old above-mentioned history left breast cancer treated with lumpectomy and is here today to start first cycle of adjuvant chemotherapy with dose dense Adriamycin Cytoxan. Echocardiogram was normal. Patient slightly anxious. Port is healed well.  REVIEW OF SYSTEMS:   Constitutional: Denies fevers, chills or abnormal weight loss Eyes: Denies blurriness of vision Ears, nose, mouth, throat, and face: Denies mucositis or sore throat Respiratory: Denies cough, dyspnea or wheezes Cardiovascular:  Denies palpitation, chest discomfort Gastrointestinal:  Denies nausea, heartburn or change in bowel habits Skin: Denies abnormal skin rashes Lymphatics: Denies new lymphadenopathy or easy bruising Neurological:Denies numbness, tingling or new weaknesses Behavioral/Psych: Mood is stable, no new changes  Extremities: No lower extremity edema  All other systems were reviewed with the patient and are negative.  I have reviewed the past medical history, past surgical history, social history and family history with the patient and they are unchanged from previous note.  ALLERGIES:  is allergic to keflex; meloxicam; prednisone; premarin; and trovan.  MEDICATIONS:  Current Outpatient Prescriptions  Medication Sig Dispense Refill  . allopurinol (ZYLOPRIM) 300 MG tablet TAKE 1 TABLET BY MOUTH EVERY DAY 90 tablet 1  . amitriptyline (ELAVIL) 25 MG tablet Take 1 to 2 tablets at bedtime for neuritis 180 tablet 1  . aspirin 81 MG tablet Take 81 mg by mouth daily.    Marland Kitchen atorvastatin (LIPITOR) 40 MG tablet TAKE 1 TABLET BY MOUTH EVERY DAY 90 tablet 1  . chlorhexidine (HIBICLENS) 4 % external liquid Apply 15 mLs (1 application total) topically once. (Patient not taking: Reported on 04/16/2016) 120 mL 0  . chlorhexidine (HIBICLENS) 4 % external liquid Apply 15 mLs (1 application total) topically once. (Patient not taking: Reported on 04/16/2016) 120 mL 0  . cholecalciferol (VITAMIN D) 1000 UNITS tablet Take 1,000 Units by mouth daily.    Marland Kitchen CINNAMON PO Take 1,000 mg by mouth 2 (two) times daily.    Marland Kitchen dexamethasone (DECADRON) 4 MG tablet Take 1 tablet (4 mg total) by mouth daily. 1 tab daily X 3 days (Patient not taking: Reported on 04/16/2016) 30 tablet 1  .  enalapril (VASOTEC) 20 MG tablet TAKE 1 TABLET BY MOUTH DAILY 90 tablet 1  . FREESTYLE LITE test strip USE ONE STRIP TO CHECK GLUCOSE ONCE DAILY. 100 each 0  . gabapentin (NEURONTIN) 100 MG capsule Take 1 to 3 capsules at bedtime for neuritis 90 capsule 5    . hydrochlorothiazide (HYDRODIURIL) 25 MG tablet TAKE 1 TABLET BY MOUTH DAILY 90 tablet 3  . lactated ringers infusion Inject 125 mLs into the vein continuous. (Patient not taking: Reported on 04/16/2016) 250 mL 0  . lidocaine-prilocaine (EMLA) cream Apply to affected area once (Patient not taking: Reported on 04/16/2016) 30 g 3  . LORazepam (ATIVAN) 0.5 MG tablet Take 1 tablet (0.5 mg total) by mouth at bedtime. (Patient not taking: Reported on 04/16/2016) 30 tablet 0  . Magnesium 250 MG TABS Take 500 mg by mouth 2 (two) times daily. Takes 2 tabs in am and 2 tabs in pm    . Multiple Vitamin (MULTIVITAMIN) tablet Take 1 tablet by mouth daily.    . Omega-3 Fatty Acids (FISH OIL) 1200 MG CAPS Take 2,400 mg by mouth daily. Takes 2 capsules daily    . ondansetron (ZOFRAN) 8 MG tablet Take 1 tablet (8 mg total) by mouth 2 (two) times daily as needed. Start on the third day after chemotherapy. (Patient not taking: Reported on 04/16/2016) 30 tablet 1  . prochlorperazine (COMPAZINE) 10 MG tablet Take 1 tablet (10 mg total) by mouth every 6 (six) hours as needed (Nausea or vomiting). (Patient not taking: Reported on 04/16/2016) 30 tablet 1  . ranitidine (ZANTAC) 300 MG tablet Take twice a day as needed for Heartburn & Acid Indigestion (Patient not taking: Reported on 04/16/2016) 60 tablet 3  . vitamin B-12 (CYANOCOBALAMIN) 1000 MCG tablet Take 1,000 mcg by mouth daily. Takes 1 tablet every other day    . vitamin C (ASCORBIC ACID) 500 MG tablet Take 500 mg by mouth daily. Takes 2 tablets daily     No current facility-administered medications for this visit.    PHYSICAL EXAMINATION: ECOG PERFORMANCE STATUS: 0 - Asymptomatic  Filed Vitals:   04/17/16 1006  BP: 152/79  Pulse: 91  Temp: 98.4 F (36.9 C)  Resp: 18   Filed Weights   04/17/16 1006  Weight: 155 lb 8 oz (70.534 kg)    GENERAL:alert, no distress and comfortable SKIN: skin color, texture, turgor are normal, no rashes or significant  lesions EYES: normal, Conjunctiva are pink and non-injected, sclera clear OROPHARYNX:no exudate, no erythema and lips, buccal mucosa, and tongue normal  NECK: supple, thyroid normal size, non-tender, without nodularity LYMPH:  no palpable lymphadenopathy in the cervical, axillary or inguinal LUNGS: clear to auscultation and percussion with normal breathing effort HEART: regular rate & rhythm and no murmurs and no lower extremity edema ABDOMEN:abdomen soft, non-tender and normal bowel sounds MUSCULOSKELETAL:no cyanosis of digits and no clubbing  NEURO: alert & oriented x 3 with fluent speech, no focal motor/sensory deficits EXTREMITIES: No lower extremity edema  LABORATORY DATA:  I have reviewed the data as listed   Chemistry      Component Value Date/Time   NA 140 04/02/2016 0940   NA 141 03/05/2016 0825   K 3.9 04/02/2016 0940   K 4.0 03/05/2016 0825   CL 103 04/02/2016 0940   CO2 26 04/02/2016 0940   CO2 30* 03/05/2016 0825   BUN 25 04/02/2016 0940   BUN 23.2 03/05/2016 0825   CREATININE 1.40* 04/02/2016 0940   CREATININE 1.50* 03/18/2016 1116  CREATININE 1.6* 03/05/2016 0825      Component Value Date/Time   CALCIUM 9.7 04/02/2016 0940   CALCIUM 10.1 03/05/2016 0825   ALKPHOS 115 04/02/2016 0940   ALKPHOS 84 03/05/2016 0825   AST 43* 04/02/2016 0940   AST 26 03/05/2016 0825   ALT 70* 04/02/2016 0940   ALT 28 03/05/2016 0825   BILITOT 0.4 04/02/2016 0940   BILITOT 0.40 03/05/2016 0825       Lab Results  Component Value Date   WBC 8.2 04/17/2016   HGB 12.4 04/17/2016   HCT 37.1 04/17/2016   MCV 89.6 04/17/2016   PLT 182 04/17/2016   NEUTROABS 3.9 04/17/2016     ASSESSMENT & PLAN:  Breast cancer of upper-inner quadrant of left female breast (Kayla Baker) Left lumpectomy 03/08/2016: Invasive ductal carcinoma, grade 2, 6.3 cm, with high-grade DCIS, margins negative, 0/4 lymph nodes negative, ER 90%, via 10%, HER-2 negative ratio 0.97, Ki-67 30%, T3 N0 stage  IIB  Oncotype DX counseling: Patient has a score of 37 which translates into 25% 10 year risk of distant recurrence with tamoxifen alone. Based on this, I strongly recommend systemic chemotherapy.  Treatment plan: 1. Adjuvant chemotherapy with dose dense Adriamycin and Cytoxan 4 followed by Abraxane weekly 12 2. Adjuvant radiation therapy followed by 3. Adjuvant antiestrogen therapy ---------------------------------------------------------------------------------------------- Current treatment: Cycle 1 day 1 of dose dense Adriamycin and Cytoxan (starting at a slightly lower dose given her age) Antiemetics were reviewed Echocardiogram revealed EF of 60-65% on 04/16/2016 Return to clinic in 1 week for toxicity check    No orders of the defined types were placed in this encounter.   The patient has a good understanding of the overall plan. she agrees with it. she will call with any problems that may develop before the next visit here.   Rulon Eisenmenger, MD 04/17/2016

## 2016-04-17 NOTE — Telephone Encounter (Signed)
Spoke with patient to follow up after 1st chemo.  She states she is ok.  Just didn't know what to expect.  Encouraged her to call with any needs or concerns.

## 2016-04-17 NOTE — Patient Instructions (Addendum)
[facility] Discharge Instructions for Patients Receiving Chemotherapy  Today you received the following chemotherapy agents Adriamycin and Cytoxan.  To help prevent nausea and vomiting after your treatment, we encourage you to take your nausea medication as directed - NO ZOFRAN FOR 3 DAYS.  If you develop nausea and vomiting that is not controlled by your nausea medication, call the clinic.   BELOW ARE SYMPTOMS THAT SHOULD BE REPORTED IMMEDIATELY:  *FEVER GREATER THAN 100.5 F  *CHILLS WITH OR WITHOUT FEVER  NAUSEA AND VOMITING THAT IS NOT CONTROLLED WITH YOUR NAUSEA MEDICATION  *UNUSUAL SHORTNESS OF BREATH  *UNUSUAL BRUISING OR BLEEDING  TENDERNESS IN MOUTH AND THROAT WITH OR WITHOUT PRESENCE OF ULCERS  *URINARY PROBLEMS  *BOWEL PROBLEMS  UNUSUAL RASH Items with * indicate a potential emergency and should be followed up as soon as possible.  Feel free to call the clinic you have any questions or concerns. The clinic phone number is [phone].  Please show the [facility] at check-in to the Emergency Department and triage nurse.   Doxorubicin injection What is this medicine? DOXORUBICIN (dox oh ROO bi sin) is a chemotherapy drug. It is used to treat many kinds of cancer like Hodgkin's disease, leukemia, non-Hodgkin's lymphoma, neuroblastoma, sarcoma, and Wilms' tumor. It is also used to treat bladder cancer, breast cancer, lung cancer, ovarian cancer, stomach cancer, and thyroid cancer. This medicine may be used for other purposes; ask your health care provider or pharmacist if you have questions. What should I tell my health care provider before I take this medicine? They need to know if you have any of these conditions: -blood disorders -heart disease, recent heart attack -infection (especially a virus infection such as chickenpox, cold sores, or herpes) -irregular heartbeat -liver disease -recent or ongoing radiation therapy -an unusual or  allergic reaction to doxorubicin, other chemotherapy agents, other medicines, foods, dyes, or preservatives -pregnant or trying to get pregnant -breast-feeding How should I use this medicine? This drug is given as an infusion into a vein. It is administered in a hospital or clinic by a specially trained health care professional. If you have pain, swelling, burning or any unusual feeling around the site of your injection, tell your health care professional right away. Talk to your pediatrician regarding the use of this medicine in children. Special care may be needed. Overdosage: If you think you have taken too much of this medicine contact a poison control center or emergency room at once. NOTE: This medicine is only for you. Do not share this medicine with others. What if I miss a dose? It is important not to miss your dose. Call your doctor or health care professional if you are unable to keep an appointment. What may interact with this medicine? Do not take this medicine with any of the following medications: -cisapride -droperidol -halofantrine -pimozide -zidovudine This medicine may also interact with the following medications: -chloroquine -chlorpromazine -clarithromycin -cyclophosphamide -cyclosporine -erythromycin -medicines for depression, anxiety, or psychotic disturbances -medicines for irregular heart beat like amiodarone, bepridil, dofetilide, encainide, flecainide, propafenone, quinidine -medicines for seizures like ethotoin, fosphenytoin, phenytoin -medicines for nausea, vomiting like dolasetron, ondansetron, palonosetron -medicines to increase blood counts like filgrastim, pegfilgrastim, sargramostim -methadone -methotrexate -pentamidine -progesterone -vaccines -verapamil Talk to your doctor or health care professional before taking any of these medicines: -acetaminophen -aspirin -ibuprofen -ketoprofen -naproxen This list may not describe all possible  interactions. Give your health care provider a list of all the medicines, herbs, non-prescription drugs, or  dietary supplements you use. Also tell them if you smoke, drink alcohol, or use illegal drugs. Some items may interact with your medicine. What should I watch for while using this medicine? Your condition will be monitored carefully while you are receiving this medicine. You will need important blood work done while you are taking this medicine. This drug may make you feel generally unwell. This is not uncommon, as chemotherapy can affect healthy cells as well as cancer cells. Report any side effects. Continue your course of treatment even though you feel ill unless your doctor tells you to stop. Your urine may turn red for a few days after your dose. This is not blood. If your urine is dark or brown, call your doctor. In some cases, you may be given additional medicines to help with side effects. Follow all directions for their use. Call your doctor or health care professional for advice if you get a fever, chills or sore throat, or other symptoms of a cold or flu. Do not treat yourself. This drug decreases your body's ability to fight infections. Try to avoid being around people who are sick. This medicine may increase your risk to bruise or bleed. Call your doctor or health care professional if you notice any unusual bleeding. Be careful brushing and flossing your teeth or using a toothpick because you may get an infection or bleed more easily. If you have any dental work done, tell your dentist you are receiving this medicine. Avoid taking products that contain aspirin, acetaminophen, ibuprofen, naproxen, or ketoprofen unless instructed by your doctor. These medicines may hide a fever. Men and women of childbearing age should use effective birth control methods while using taking this medicine. Do not become pregnant while taking this medicine. There is a potential for serious side effects to an  unborn child. Talk to your health care professional or pharmacist for more information. Do not breast-feed an infant while taking this medicine. Do not let others touch your urine or other body fluids for 5 days after each treatment with this medicine. Caregivers should wear latex gloves to avoid touching body fluids during this time. There is a maximum amount of this medicine you should receive throughout your life. The amount depends on the medical condition being treated and your overall health. Your doctor will watch how much of this medicine you receive in your lifetime. Tell your doctor if you have taken this medicine before. What side effects may I notice from receiving this medicine? Side effects that you should report to your doctor or health care professional as soon as possible: -allergic reactions like skin rash, itching or hives, swelling of the face, lips, or tongue -low blood counts - this medicine may decrease the number of white blood cells, red blood cells and platelets. You may be at increased risk for infections and bleeding. -signs of infection - fever or chills, cough, sore throat, pain or difficulty passing urine -signs of decreased platelets or bleeding - bruising, pinpoint red spots on the skin, black, tarry stools, blood in the urine -signs of decreased red blood cells - unusually weak or tired, fainting spells, lightheadedness -breathing problems -chest pain -fast, irregular heartbeat -mouth sores -nausea, vomiting -pain, swelling, redness at site where injected -pain, tingling, numbness in the hands or feet -swelling of ankles, feet, or hands -unusual bleeding or bruising Side effects that usually do not require medical attention (report to your doctor or health care professional if they continue or are bothersome): -diarrhea -facial  flushing -hair loss -loss of appetite -missed menstrual periods -nail discoloration or damage -red or watery eyes -red colored  urine -stomach upset This list may not describe all possible side effects. Call your doctor for medical advice about side effects. You may report side effects to FDA at 1-800-FDA-1088. Where should I keep my medicine? This drug is given in a hospital or clinic and will not be stored at home. NOTE: This sheet is a summary. It may not cover all possible information. If you have questions about this medicine, talk to your doctor, pharmacist, or health care provider.    2016, Elsevier/Gold Standard. (2013-03-15 09:54:34)     Cyclophosphamide injection What is this medicine? CYCLOPHOSPHAMIDE (sye kloe FOSS fa mide) is a chemotherapy drug. It slows the growth of cancer cells. This medicine is used to treat many types of cancer like lymphoma, myeloma, leukemia, breast cancer, and ovarian cancer, to name a few. This medicine may be used for other purposes; ask your health care provider or pharmacist if you have questions. What should I tell my health care provider before I take this medicine? They need to know if you have any of these conditions: -blood disorders -history of other chemotherapy -infection -kidney disease -liver disease -recent or ongoing radiation therapy -tumors in the bone marrow -an unusual or allergic reaction to cyclophosphamide, other chemotherapy, other medicines, foods, dyes, or preservatives -pregnant or trying to get pregnant -breast-feeding How should I use this medicine? This drug is usually given as an injection into a vein or muscle or by infusion into a vein. It is administered in a hospital or clinic by a specially trained health care professional. Talk to your pediatrician regarding the use of this medicine in children. Special care may be needed. Overdosage: If you think you have taken too much of this medicine contact a poison control center or emergency room at once. NOTE: This medicine is only for you. Do not share this medicine with others. What if I  miss a dose? It is important not to miss your dose. Call your doctor or health care professional if you are unable to keep an appointment. What may interact with this medicine? This medicine may interact with the following medications: -amiodarone -amphotericin B -azathioprine -certain antiviral medicines for HIV or AIDS such as protease inhibitors (e.g., indinavir, ritonavir) and zidovudine -certain blood pressure medications such as benazepril, captopril, enalapril, fosinopril, lisinopril, moexipril, monopril, perindopril, quinapril, ramipril, trandolapril -certain cancer medications such as anthracyclines (e.g., daunorubicin, doxorubicin), busulfan, cytarabine, paclitaxel, pentostatin, tamoxifen, trastuzumab -certain diuretics such as chlorothiazide, chlorthalidone, hydrochlorothiazide, indapamide, metolazone -certain medicines that treat or prevent blood clots like warfarin -certain muscle relaxants such as succinylcholine -cyclosporine -etanercept -indomethacin -medicines to increase blood counts like filgrastim, pegfilgrastim, sargramostim -medicines used as general anesthesia -metronidazole -natalizumab This list may not describe all possible interactions. Give your health care provider a list of all the medicines, herbs, non-prescription drugs, or dietary supplements you use. Also tell them if you smoke, drink alcohol, or use illegal drugs. Some items may interact with your medicine. What should I watch for while using this medicine? Visit your doctor for checks on your progress. This drug may make you feel generally unwell. This is not uncommon, as chemotherapy can affect healthy cells as well as cancer cells. Report any side effects. Continue your course of treatment even though you feel ill unless your doctor tells you to stop. Drink water or other fluids as directed. Urinate often, even at night. In some cases,  you may be given additional medicines to help with side effects.  Follow all directions for their use. Call your doctor or health care professional for advice if you get a fever, chills or sore throat, or other symptoms of a cold or flu. Do not treat yourself. This drug decreases your body's ability to fight infections. Try to avoid being around people who are sick. This medicine may increase your risk to bruise or bleed. Call your doctor or health care professional if you notice any unusual bleeding. Be careful brushing and flossing your teeth or using a toothpick because you may get an infection or bleed more easily. If you have any dental work done, tell your dentist you are receiving this medicine. You may get drowsy or dizzy. Do not drive, use machinery, or do anything that needs mental alertness until you know how this medicine affects you. Do not become pregnant while taking this medicine or for 1 year after stopping it. Women should inform their doctor if they wish to become pregnant or think they might be pregnant. Men should not father a child while taking this medicine and for 4 months after stopping it. There is a potential for serious side effects to an unborn child. Talk to your health care professional or pharmacist for more information. Do not breast-feed an infant while taking this medicine. This medicine may interfere with the ability to have a child. This medicine has caused ovarian failure in some women. This medicine has caused reduced sperm counts in some men. You should talk with your doctor or health care professional if you are concerned about your fertility. If you are going to have surgery, tell your doctor or health care professional that you have taken this medicine. What side effects may I notice from receiving this medicine? Side effects that you should report to your doctor or health care professional as soon as possible: -allergic reactions like skin rash, itching or hives, swelling of the face, lips, or tongue -low blood counts - this  medicine may decrease the number of white blood cells, red blood cells and platelets. You may be at increased risk for infections and bleeding. -signs of infection - fever or chills, cough, sore throat, pain or difficulty passing urine -signs of decreased platelets or bleeding - bruising, pinpoint red spots on the skin, black, tarry stools, blood in the urine -signs of decreased red blood cells - unusually weak or tired, fainting spells, lightheadedness -breathing problems -dark urine -dizziness -palpitations -swelling of the ankles, feet, hands -trouble passing urine or change in the amount of urine -weight gain -yellowing of the eyes or skin Side effects that usually do not require medical attention (report to your doctor or health care professional if they continue or are bothersome): -changes in nail or skin color -hair loss -missed menstrual periods -mouth sores -nausea, vomiting This list may not describe all possible side effects. Call your doctor for medical advice about side effects. You may report side effects to FDA at 1-800-FDA-1088. Where should I keep my medicine? This drug is given in a hospital or clinic and will not be stored at home. NOTE: This sheet is a summary. It may not cover all possible information. If you have questions about this medicine, talk to your doctor, pharmacist, or health care provider.    2016, [employer]vier/Gold Standard. (2012-10-01 16:22:58)  Pegfilgrastim injection What is this medicine? PEGFILGRASTIM (PEG fil gra stim) is a long-acting granulocyte colony-stimulating factor that stimulates the growth of neutrophils, a  type of white blood cell important in the body's fight against infection. It is used to reduce the incidence of fever and infection in patients with certain types of cancer who are receiving chemotherapy that affects the bone marrow, and to increase survival after being exposed to high doses of radiation. This medicine may be used for  other purposes; ask your health care provider or pharmacist if you have questions. What should I tell my health care provider before I take this medicine? They need to know if you have any of these conditions: -kidney disease -latex allergy -ongoing radiation therapy -sickle cell disease -skin reactions to acrylic adhesives (On-Body Injector only) -an unusual or allergic reaction to pegfilgrastim, filgrastim, other medicines, foods, dyes, or preservatives -pregnant or trying to get pregnant -breast-feeding How should I use this medicine? This medicine is for injection under the skin. If you get this medicine at home, you will be taught how to prepare and give the pre-filled syringe or how to use the On-body Injector. Refer to the patient Instructions for Use for detailed instructions. Use exactly as directed. Take your medicine at regular intervals. Do not take your medicine more often than directed. It is important that you put your used needles and syringes in a special sharps container. Do not put them in a trash can. If you do not have a sharps container, call your pharmacist or healthcare provider to get one. Talk to your pediatrician regarding the use of this medicine in children. While this drug may be prescribed for selected conditions, precautions do apply. Overdosage: If you think you have taken too much of this medicine contact a poison control center or emergency room at once. NOTE: This medicine is only for you. Do not share this medicine with others. What if I miss a dose? It is important not to miss your dose. Call your doctor or health care professional if you miss your dose. If you miss a dose due to an On-body Injector failure or leakage, a new dose should be administered as soon as possible using a single prefilled syringe for manual use. What may interact with this medicine? Interactions have not been studied. Give your health care provider a list of all the medicines, herbs,  non-prescription drugs, or dietary supplements you use. Also tell them if you smoke, drink alcohol, or use illegal drugs. Some items may interact with your medicine. This list may not describe all possible interactions. Give your health care provider a list of all the medicines, herbs, non-prescription drugs, or dietary supplements you use. Also tell them if you smoke, drink alcohol, or use illegal drugs. Some items may interact with your medicine. What should I watch for while using this medicine? You may need blood work done while you are taking this medicine. If you are going to need a MRI, CT scan, or other procedure, tell your doctor that you are using this medicine (On-Body Injector only). What side effects may I notice from receiving this medicine? Side effects that you should report to your doctor or health care professional as soon as possible: -allergic reactions like skin rash, itching or hives, swelling of the face, lips, or tongue -dizziness -fever -pain, redness, or irritation at site where injected -pinpoint red spots on the skin -red or dark-brown urine -shortness of breath or breathing problems -stomach or side pain, or pain at the shoulder -swelling -tiredness -trouble passing urine or change in the amount of urine Side effects that usually do not require medical  attention (report to your doctor or health care professional if they continue or are bothersome): -bone pain -muscle pain This list may not describe all possible side effects. Call your doctor for medical advice about side effects. You may report side effects to FDA at 1-800-FDA-1088. Where should I keep my medicine? Keep out of the reach of children. Store pre-filled syringes in a refrigerator between 2 and 8 degrees C (36 and 46 degrees F). Do not freeze. Keep in carton to protect from light. Throw away this medicine if it is left out of the refrigerator for more than 48 hours. Throw away any unused medicine after  the expiration date. NOTE: This sheet is a summary. It may not cover all possible information. If you have questions about this medicine, talk to your doctor, pharmacist, or health care provider.    2016, Elsevier/Gold Standard. (2014-12-07 14:30:14)

## 2016-04-17 NOTE — Assessment & Plan Note (Addendum)
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 Based on this, I strongly recommend systemic chemotherapy.  Treatment plan: 1. Adjuvant chemotherapy with dose dense Adriamycin and Cytoxan 4 followed by Abraxane weekly 12 2. Adjuvant radiation therapy followed by 3. Adjuvant antiestrogen therapy ---------------------------------------------------------------------------------------------- Current treatment: Cycle 1 day 1 of dose dense Adriamycin and Cytoxan (starting at a slightly lower dose given her age) Antiemetics were reviewed Echocardiogram revealed EF of 60-65% on 04/16/2016 Return to clinic in 1 week for toxicity check

## 2016-04-18 ENCOUNTER — Telehealth: Payer: Self-pay

## 2016-04-18 NOTE — Telephone Encounter (Signed)
[place] for chemotherapy F/U.  Patient is doing well.  Denies n/v.  Denies any new side effects or symptoms.  Bowel and bladder is functioning well.  Eating and drinking well and I instructed to drink 64 oz minimum daily or at least the day before, of and after treatment.  Denies questions at this time and encouraged to call if needed.  Reviewed how to call after hours in the case of an emergency. Pt asked about the effects of the chemo on her blood sugar, stated that it was high this morning, spoke with Freada Bergeron RN, Dr Landis Gandy' nurse who said that it was likely caused by the steroid pre meds ordered with her chemo, if her numbers stayed up she needed to get in touch with her Primary MD and let him decide how to treat it with the added steroids. Pt verbalized understanding

## 2016-04-18 NOTE — Telephone Encounter (Signed)
-----   Message from Herschell Dimes, RN sent at 04/17/2016  2:11 PM EDT ----- Regarding: Lindi Adie - first time a/c follow up Contact: [phone] Dr. Geralyn Flash pt Jermiah Tisdel tolerated her Adriamycin and Cytoxan well today. She was given a lot of information about nausea medications and was also given the ONPRO after watching the video. I did not discharge her from this visit - please make her aware she can use Claritin for bone pain if needed - I am not sure if the other nurse told her that when applying it. Thanks. [phone].

## 2016-04-23 ENCOUNTER — Telehealth: Payer: Self-pay | Admitting: Genetic Counselor

## 2016-04-23 NOTE — Telephone Encounter (Signed)
Discussed with Kayla Baker that her genetic test results were negative for pathogenic mutations within any of 20 genes on the Breast/Ovarian Cancer Panel that would increase her genetic risk for breast, ovarian, or other related cancers.  Additionally, no uncertain changes were found.  Discussed that this is most likely a reassuring result for Kayla Baker, especially since most of the cancers in the family were diagnosed at later ages.  Encouraged her to call and update the family history with Kayla Baker in the future, especially if anyone else in the family is diagnosed with cancer.  Encouraged her to follow her doctors' recommendations for future cancer screening.  Discussed that her sister (diagnosed with breast cancer at 81) may be eligible for genetic testing herself, if interested, though it makes it much less likely that she would test positive since Kayla Baker has already tested negative.  Kayla Baker is welcome to call with any questions.

## 2016-04-24 ENCOUNTER — Ambulatory Visit (HOSPITAL_BASED_OUTPATIENT_CLINIC_OR_DEPARTMENT_OTHER): Payer: PPO | Admitting: Hematology and Oncology

## 2016-04-24 ENCOUNTER — Other Ambulatory Visit (HOSPITAL_BASED_OUTPATIENT_CLINIC_OR_DEPARTMENT_OTHER): Payer: PPO

## 2016-04-24 ENCOUNTER — Encounter: Payer: Self-pay | Admitting: Hematology and Oncology

## 2016-04-24 ENCOUNTER — Ambulatory Visit: Payer: Self-pay | Admitting: Genetic Counselor

## 2016-04-24 VITALS — BP 101/52 | HR 107 | Temp 97.7°F | Resp 18 | Wt 151.0 lb

## 2016-04-24 DIAGNOSIS — C50212 Malignant neoplasm of upper-inner quadrant of left female breast: Secondary | ICD-10-CM | POA: Diagnosis not present

## 2016-04-24 DIAGNOSIS — Z800 Family history of malignant neoplasm of digestive organs: Secondary | ICD-10-CM

## 2016-04-24 DIAGNOSIS — R530 Neoplastic (malignant) related fatigue: Secondary | ICD-10-CM

## 2016-04-24 DIAGNOSIS — Z803 Family history of malignant neoplasm of breast: Secondary | ICD-10-CM

## 2016-04-24 DIAGNOSIS — Z1379 Encounter for other screening for genetic and chromosomal anomalies: Secondary | ICD-10-CM

## 2016-04-24 DIAGNOSIS — Z809 Family history of malignant neoplasm, unspecified: Secondary | ICD-10-CM

## 2016-04-24 LAB — CBC WITH DIFFERENTIAL/PLATELET
BASO%: 0.5 % (ref 0.0–2.0)
BASOS ABS: 0 10*3/uL (ref 0.0–0.1)
EOS ABS: 0.2 10*3/uL (ref 0.0–0.5)
EOS%: 11.7 % — AB (ref 0.0–7.0)
HEMATOCRIT: 35.2 % (ref 34.8–46.6)
HGB: 11.9 g/dL (ref 11.6–15.9)
LYMPH#: 0.7 10*3/uL — AB (ref 0.9–3.3)
LYMPH%: 36.7 % (ref 14.0–49.7)
MCH: 30 pg (ref 25.1–34.0)
MCHC: 33.8 g/dL (ref 31.5–36.0)
MCV: 88.7 fL (ref 79.5–101.0)
MONO#: 0.3 10*3/uL (ref 0.1–0.9)
MONO%: 14.8 % — ABNORMAL HIGH (ref 0.0–14.0)
NEUT#: 0.7 10*3/uL — ABNORMAL LOW (ref 1.5–6.5)
NEUT%: 36.3 % — AB (ref 38.4–76.8)
PLATELETS: 100 10*3/uL — AB (ref 145–400)
RBC: 3.97 10*6/uL (ref 3.70–5.45)
RDW: 14.3 % (ref 11.2–14.5)
WBC: 2 10*3/uL — ABNORMAL LOW (ref 3.9–10.3)

## 2016-04-24 LAB — COMPREHENSIVE METABOLIC PANEL
ALT: 71 U/L — ABNORMAL HIGH (ref 0–55)
ANION GAP: 9 meq/L (ref 3–11)
AST: 44 U/L — ABNORMAL HIGH (ref 5–34)
Albumin: 3.5 g/dL (ref 3.5–5.0)
Alkaline Phosphatase: 138 U/L (ref 40–150)
BILIRUBIN TOTAL: 0.51 mg/dL (ref 0.20–1.20)
BUN: 36.6 mg/dL — ABNORMAL HIGH (ref 7.0–26.0)
CALCIUM: 9.6 mg/dL (ref 8.4–10.4)
CHLORIDE: 102 meq/L (ref 98–109)
CO2: 28 mEq/L (ref 22–29)
Creatinine: 1.6 mg/dL — ABNORMAL HIGH (ref 0.6–1.1)
EGFR: 31 mL/min/{1.73_m2} — AB (ref >90)
Glucose: 152 mg/dl — ABNORMAL HIGH (ref 70–140)
POTASSIUM: 4.9 meq/L (ref 3.5–5.1)
Sodium: 138 mEq/L (ref 136–145)
Total Protein: 6.5 g/dL (ref 6.4–8.3)

## 2016-04-24 NOTE — Progress Notes (Signed)
Patient Care Team: [name], MD as PCP - General Macarthur Critchley, OD as Referring Physician (Optometry) [name], MD as Consulting Physician (Dermatology) Latanya Maudlin, MD as Consulting Physician (Orthopedic Surgery) Inda Castle, MD as Consulting Physician (Gastroenterology)  DIAGNOSIS: Breast cancer of upper-inner quadrant of left female breast [facility])   Staging form: Breast, AJCC 7th Edition     Clinical stage from 03/05/2016: Stage IIA (T2, N0, M0) - Unsigned     Pathologic: Stage IIB (T3, N0, cM0) - Signed by [name], MD on 03/27/2016   SUMMARY OF ONCOLOGIC HISTORY:   Breast cancer of upper-inner quadrant of left female breast ([place])   02/26/2016 Initial Diagnosis Left breast biopsy 11:00 position: invasive ductal carcinoma with DCIS, ER 90%, PR 10%, HER-2 negative, Ki-67 30%, grade 2, 2.2 cm palpable lesion T2 N0 stage II a clinical stage   03/18/2016 Surgery Left lumpectomy: Invasive ductal carcinoma, grade 2, 6.3 cm, with high-grade DCIS, margins negative, 0/4 lymph nodes negative, ER 90%, via 10%, HER-2 negative ratio 0.97, Ki-67 30%, T3 N0 stage IIB   03/25/2016 Procedure Genetic testing is negative for pathogenic mutations within any of the 20 Genes on the breast/ovarian cancer panel   04/04/2016 Oncotype testing Oncotype DX recurrence score 37, 25% 10 year distant risk of recurrence   04/17/2016 -  Chemotherapy Adjuvant chemotherapy with dose dense Adriamycin and Cytoxan followed by Abraxane weekly 12    CHIEF COMPLIANT: Cycle 1 day 8 dose dense Adriamycin and Cytoxan  INTERVAL HISTORY: Kayla Baker is a 80 year old with above-mentioned history left breast cancer currently on adjuvant chemotherapy and today's cycle 1 day 8 of chemotherapy. She has tolerated cycle one extremely well. She did not have any nausea or vomiting. She does feel slightly tired. Denies any mouth sores.  REVIEW OF SYSTEMS:   Constitutional: Denies fevers, chills or abnormal weight loss Eyes:  Denies blurriness of vision Ears, nose, mouth, throat, and face: Denies mucositis or sore throat Respiratory: Denies cough, dyspnea or wheezes Cardiovascular: Denies palpitation, chest discomfort Gastrointestinal:  Denies nausea, heartburn or change in bowel habits Skin: Denies abnormal skin rashes Lymphatics: Denies new lymphadenopathy or easy bruising Neurological:Denies numbness, tingling or new weaknesses Behavioral/Psych: Mood is stable, no new changes  Extremities: No lower extremity edema Breast:  denies any pain or lumps or nodules in either breasts All other systems were reviewed with the patient and are negative.  I have reviewed the past medical history, past surgical history, social history and family history with the patient and they are unchanged from previous note.  ALLERGIES:  is allergic to keflex; meloxicam; prednisone; premarin; and trovan.  MEDICATIONS:  Current Outpatient Prescriptions  Medication Sig Dispense Refill  . allopurinol (ZYLOPRIM) 300 MG tablet TAKE 1 TABLET BY MOUTH EVERY DAY 90 tablet 1  . amitriptyline (ELAVIL) 25 MG tablet Take 1 to 2 tablets at bedtime for neuritis 180 tablet 1  . aspirin 81 MG tablet Take 81 mg by mouth daily.    Marland Kitchen atorvastatin (LIPITOR) 40 MG tablet TAKE 1 TABLET BY MOUTH EVERY DAY 90 tablet 1  . chlorhexidine (HIBICLENS) 4 % external liquid Apply 15 mLs (1 application total) topically once. (Patient not taking: Reported on 04/16/2016) 120 mL 0  . chlorhexidine (HIBICLENS) 4 % external liquid Apply 15 mLs (1 application total) topically once. (Patient not taking: Reported on 04/16/2016) 120 mL 0  . cholecalciferol (VITAMIN D) 1000 UNITS tablet Take 1,000 Units by mouth daily.    Marland Kitchen CINNAMON PO Take  1,000 mg by mouth 2 (two) times daily.    Marland Kitchen dexamethasone (DECADRON) 4 MG tablet Take 1 tablet (4 mg total) by mouth daily. 1 tab daily X 3 days (Patient not taking: Reported on 04/16/2016) 30 tablet 1  . enalapril (VASOTEC) 20 MG tablet  TAKE 1 TABLET BY MOUTH DAILY 90 tablet 1  . FREESTYLE LITE test strip USE ONE STRIP TO CHECK GLUCOSE ONCE DAILY. 100 each 0  . gabapentin (NEURONTIN) 100 MG capsule Take 1 to 3 capsules at bedtime for neuritis 90 capsule 5  . hydrochlorothiazide (HYDRODIURIL) 25 MG tablet TAKE 1 TABLET BY MOUTH DAILY 90 tablet 3  . lactated ringers infusion Inject 125 mLs into the vein continuous. (Patient not taking: Reported on 04/16/2016) 250 mL 0  . lidocaine-prilocaine (EMLA) cream Apply to affected area once (Patient not taking: Reported on 04/16/2016) 30 g 3  . LORazepam (ATIVAN) 0.5 MG tablet Take 1 tablet (0.5 mg total) by mouth at bedtime. (Patient not taking: Reported on 04/16/2016) 30 tablet 0  . Magnesium 250 MG TABS Take 500 mg by mouth 2 (two) times daily. Takes 2 tabs in am and 2 tabs in pm    . Multiple Vitamin (MULTIVITAMIN) tablet Take 1 tablet by mouth daily.    . Omega-3 Fatty Acids (FISH OIL) 1200 MG CAPS Take 2,400 mg by mouth daily. Takes 2 capsules daily    . ondansetron (ZOFRAN) 8 MG tablet Take 1 tablet (8 mg total) by mouth 2 (two) times daily as needed. Start on the third day after chemotherapy. (Patient not taking: Reported on 04/16/2016) 30 tablet 1  . prochlorperazine (COMPAZINE) 10 MG tablet Take 1 tablet (10 mg total) by mouth every 6 (six) hours as needed (Nausea or vomiting). (Patient not taking: Reported on 04/16/2016) 30 tablet 1  . ranitidine (ZANTAC) 300 MG tablet Take twice a day as needed for Heartburn & Acid Indigestion (Patient not taking: Reported on 04/16/2016) 60 tablet 3  . vitamin B-12 (CYANOCOBALAMIN) 1000 MCG tablet Take 1,000 mcg by mouth daily. Takes 1 tablet every other day    . vitamin C (ASCORBIC ACID) 500 MG tablet Take 500 mg by mouth daily. Takes 2 tablets daily     No current facility-administered medications for this visit.    PHYSICAL EXAMINATION: ECOG PERFORMANCE STATUS: 1 - Symptomatic but completely ambulatory  Filed Vitals:   04/24/16 1458  BP:  101/52  Pulse: 107  Temp: 97.7 F (36.5 C)  Resp: 18   Filed Weights   04/24/16 1458  Weight: 151 lb (68.493 kg)    GENERAL:alert, no distress and comfortable SKIN: skin color, texture, turgor are normal, no rashes or significant lesions EYES: normal, Conjunctiva are pink and non-injected, sclera clear OROPHARYNX:no exudate, no erythema and lips, buccal mucosa, and tongue normal  NECK: supple, thyroid normal size, non-tender, without nodularity LYMPH:  no palpable lymphadenopathy in the cervical, axillary or inguinal LUNGS: clear to auscultation and percussion with normal breathing effort HEART: regular rate & rhythm and no murmurs and no lower extremity edema ABDOMEN:abdomen soft, non-tender and normal bowel sounds MUSCULOSKELETAL:no cyanosis of digits and no clubbing  NEURO: alert & oriented x 3 with fluent speech, no focal motor/sensory deficits EXTREMITIES: No lower extremity edema  LABORATORY DATA:  I have reviewed the data as listed   Chemistry      Component Value Date/Time   NA 138 04/24/2016 1446   NA 140 04/02/2016 0940   K 4.9 04/24/2016 1446   K 3.9 04/02/2016  0940   CL 103 04/02/2016 0940   CO2 28 04/24/2016 1446   CO2 26 04/02/2016 0940   BUN 36.6* 04/24/2016 1446   BUN 25 04/02/2016 0940   CREATININE 1.6* 04/24/2016 1446   CREATININE 1.40* 04/02/2016 0940   CREATININE 1.50* 03/18/2016 1116      Component Value Date/Time   CALCIUM 9.6 04/24/2016 1446   CALCIUM 9.7 04/02/2016 0940   ALKPHOS 138 04/24/2016 1446   ALKPHOS 115 04/02/2016 0940   AST 44* 04/24/2016 1446   AST 43* 04/02/2016 0940   ALT 71* 04/24/2016 1446   ALT 70* 04/02/2016 0940   BILITOT 0.51 04/24/2016 1446   BILITOT 0.4 04/02/2016 0940       Lab Results  Component Value Date   WBC 2.0* 04/24/2016   HGB 11.9 04/24/2016   HCT 35.2 04/24/2016   MCV 88.7 04/24/2016   PLT 100* 04/24/2016   NEUTROABS 0.7* 04/24/2016     ASSESSMENT & PLAN:  Breast cancer of upper-inner  quadrant of left female breast ([place]) Left lumpectomy 03/08/2016: Invasive ductal carcinoma, grade 2, 6.3 cm, with high-grade DCIS, margins negative, 0/4 lymph nodes negative, ER 90%, via 10%, HER-2 negative ratio 0.97, Ki-67 30%, T3 N0 stage IIB  Oncotype DX counseling: Patient has a score of 63 which translates into 25% 10 year risk of distant recurrence with tamoxifen alone. Based on this, I strongly recommend systemic chemotherapy.  Treatment plan: 1. Adjuvant chemotherapy with dose dense Adriamycin and Cytoxan 4 followed by Abraxane weekly 12 2. Adjuvant radiation therapy followed by 3. Adjuvant antiestrogen therapy ---------------------------------------------------------------------------------------------- Current treatment: Cycle 1 day 8 of dose dense Adriamycin and Cytoxan (starting at a slightly lower dose given her age) Echocardiogram revealed EF of 60-65% on 04/16/2016  Chemotherapy toxicities: 1. Fatigue due to chemotherapy  Return to clinic in 1 week for cycle 2   No orders of the defined types were placed in this encounter.   The patient has a good understanding of the overall plan. she agrees with it. she will call with any problems that may develop before the next visit here.   Rulon Eisenmenger, MD 04/24/2016

## 2016-04-24 NOTE — Assessment & Plan Note (Signed)
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 Based on this, I strongly recommend systemic chemotherapy.  Treatment plan: 1. Adjuvant chemotherapy with dose dense Adriamycin and Cytoxan 4 followed by Abraxane weekly 12 2. Adjuvant radiation therapy followed by 3. Adjuvant antiestrogen therapy ---------------------------------------------------------------------------------------------- Current treatment: Cycle 1 day 8 of dose dense Adriamycin and Cytoxan (starting at a slightly lower dose given her age) Echocardiogram revealed EF of 60-65% on 04/16/2016  Chemotherapy toxicities:  Return to clinic in 1 week for cycle 2

## 2016-04-25 NOTE — Progress Notes (Signed)
GENETIC TEST RESULT  HPI: Ms. Minarik was previously seen in the [facility] due to a personal history of breast cancer, family history of breast and pancreatic cancers, and concerns regarding a hereditary predisposition to cancer. Please refer to our prior cancer genetics clinic note from March 27, 2016 for more information regarding Ms. Niday's medical, social and family histories, and our assessment and recommendations, at the time. Ms. Utsey's recent genetic test results were disclosed to her, as were recommendations warranted by these results. These results and recommendations are discussed in more detail below.  GENETIC TEST RESULTS: At the time of Ms. Hidalgo's visit on 03/27/16, we recommended she pursue genetic testing of the 20-gene Breast/Ovarian Cancer Panel through [facility].  The Breast/Ovarian Cancer Panel offered by [facility] ([place], [place]) includes sequencing and deletion/duplication analysis for the following 19 genes:  ATM, BARD1, BRCA1, BRCA2, BRIP1, CDH1, CHEK2, FANCC, MLH1, MSH2, MSH6, NBN, PALB2, PMS2, PTEN, RAD51C, RAD51D, TP53, and XRCC2.  This panel also includes deletion/duplication analysis (without sequencing) for one gene, EPCAM.  Those results are now back, the report date for which is Apr 22, 2016.  Genetic testing was normal, and did not reveal a deleterious mutation in these genes.  Additionally, no variants of uncertain significance (VUSes) were found.  The test report will be scanned into EPIC and will be located under the Results Review tab in the Pathology>Molecular Pathology section.   We discussed with Ms. Shadix that since the current genetic testing is not perfect, it is possible there may be a gene mutation in one of these genes that current testing cannot detect, but that chance is small. We also discussed, that it is possible that another gene that has not yet been discovered, or that we have not yet tested, is responsible for the  cancer diagnoses in the family, and it is, therefore, important to remain in touch with cancer genetics in the future so that we can continue to offer Ms. Bullis the most up to date genetic testing.    CANCER SCREENING RECOMMENDATIONS: This result is reassuring and indicates that Ms. Antkowiak likely does not have an increased risk for a future cancer due to a mutation in one of these genes. This normal test also suggests that Ms. Hale's cancer was most likely not due to an inherited predisposition associated with one of these genes.  Most cancers happen by chance and this negative test suggests that her cancer falls into this category.  We, therefore, recommended she continue to follow the cancer management and screening guidelines provided by her oncology and primary healthcare providers.   RECOMMENDATIONS FOR FAMILY MEMBERS: Women in this family might be at some increased risk of developing cancer, over the general population risk, simply due to the family history of cancer. We recommended women in this family have a yearly mammogram beginning at age 40, or 10 years younger than the earliest onset of cancer, an an annual clinical breast exam, and perform monthly breast self-exams. Women in this family should also have a gynecological exam as recommended by their primary provider. All family members should have a colonoscopy by age 50.  Based on Ms. Back's family history, her sister, who was diagnosed with breast cancer at age 53, may be eligible for genetic counseling and testing, although it makes it somewhat less likely, since Ms. Featherston has already had negative testing, that her sister would test positive.  Ms. All will let us know if we can be of   any assistance in coordinating genetic counseling and/or testing for this family member.   FOLLOW-UP: Lastly, we discussed with Ms. Severtson that cancer genetics is a rapidly advancing field and it is possible that new genetic tests will be appropriate for her and/or her  family members in the future. We encouraged her to remain in contact with cancer genetics on an annual basis so we can update her personal and family histories and let her know of advances in cancer genetics that may benefit this family.   Our contact number was provided. Ms. Onstott's questions were answered to her satisfaction, and she knows she is welcome to call us at anytime with additional questions or concerns.   Kayla Boggs, MS, CGC Certified Genetic Counselor [email or web address] Phone: [phone]  

## 2016-05-01 ENCOUNTER — Encounter: Payer: Self-pay | Admitting: *Deleted

## 2016-05-01 ENCOUNTER — Encounter: Payer: Self-pay | Admitting: Hematology and Oncology

## 2016-05-01 ENCOUNTER — Ambulatory Visit (HOSPITAL_BASED_OUTPATIENT_CLINIC_OR_DEPARTMENT_OTHER): Payer: PPO | Admitting: Hematology and Oncology

## 2016-05-01 ENCOUNTER — Other Ambulatory Visit (HOSPITAL_BASED_OUTPATIENT_CLINIC_OR_DEPARTMENT_OTHER): Payer: PPO

## 2016-05-01 ENCOUNTER — Ambulatory Visit (HOSPITAL_BASED_OUTPATIENT_CLINIC_OR_DEPARTMENT_OTHER): Payer: PPO

## 2016-05-01 ENCOUNTER — Other Ambulatory Visit: Payer: Self-pay | Admitting: Hematology and Oncology

## 2016-05-01 VITALS — BP 132/70 | HR 107 | Temp 97.9°F | Resp 18 | Wt 154.0 lb

## 2016-05-01 DIAGNOSIS — Z5111 Encounter for antineoplastic chemotherapy: Secondary | ICD-10-CM

## 2016-05-01 DIAGNOSIS — Z170 Estrogen receptor positive status [ER+]: Secondary | ICD-10-CM

## 2016-05-01 DIAGNOSIS — C50212 Malignant neoplasm of upper-inner quadrant of left female breast: Secondary | ICD-10-CM

## 2016-05-01 DIAGNOSIS — R5383 Other fatigue: Secondary | ICD-10-CM | POA: Diagnosis not present

## 2016-05-01 DIAGNOSIS — Z5189 Encounter for other specified aftercare: Secondary | ICD-10-CM

## 2016-05-01 LAB — CBC WITH DIFFERENTIAL/PLATELET
BASO%: 1 % (ref 0.0–2.0)
Basophils Absolute: 0.1 10*3/uL (ref 0.0–0.1)
EOS%: 0.9 % (ref 0.0–7.0)
Eosinophils Absolute: 0.1 10*3/uL (ref 0.0–0.5)
HCT: 33.9 % — ABNORMAL LOW (ref 34.8–46.6)
HEMOGLOBIN: 11.1 g/dL — AB (ref 11.6–15.9)
LYMPH%: 13.9 % — ABNORMAL LOW (ref 14.0–49.7)
MCH: 29.6 pg (ref 25.1–34.0)
MCHC: 32.8 g/dL (ref 31.5–36.0)
MCV: 90.2 fL (ref 79.5–101.0)
MONO#: 0.8 10*3/uL (ref 0.1–0.9)
MONO%: 5.5 % (ref 0.0–14.0)
NEUT%: 78.7 % — ABNORMAL HIGH (ref 38.4–76.8)
NEUTROS ABS: 11.6 10*3/uL — AB (ref 1.5–6.5)
Platelets: 165 10*3/uL (ref 145–400)
RBC: 3.76 10*6/uL (ref 3.70–5.45)
RDW: 14.9 % — AB (ref 11.2–14.5)
WBC: 14.8 10*3/uL — AB (ref 3.9–10.3)
lymph#: 2.1 10*3/uL (ref 0.9–3.3)

## 2016-05-01 LAB — COMPREHENSIVE METABOLIC PANEL
ALBUMIN: 3.5 g/dL (ref 3.5–5.0)
ALK PHOS: 179 U/L — AB (ref 40–150)
ALT: 166 U/L — ABNORMAL HIGH (ref 0–55)
AST: 77 U/L — AB (ref 5–34)
Anion Gap: 7 mEq/L (ref 3–11)
BUN: 28 mg/dL — AB (ref 7.0–26.0)
CO2: 27 mEq/L (ref 22–29)
Calcium: 9.5 mg/dL (ref 8.4–10.4)
Chloride: 107 mEq/L (ref 98–109)
Creatinine: 1.5 mg/dL — ABNORMAL HIGH (ref 0.6–1.1)
EGFR: 32 mL/min/{1.73_m2} — ABNORMAL LOW (ref >90)
GLUCOSE: 119 mg/dL (ref 70–140)
POTASSIUM: 4.3 meq/L (ref 3.5–5.1)
SODIUM: 142 meq/L (ref 136–145)
TOTAL PROTEIN: 6.5 g/dL (ref 6.4–8.3)
Total Bilirubin: 0.3 mg/dL (ref 0.20–1.20)

## 2016-05-01 MED ORDER — FOSAPREPITANT DIMEGLUMINE INJECTION 150 MG
Freq: Once | INTRAVENOUS | Status: AC
  Administered 2016-05-01: 12:00:00 via INTRAVENOUS
  Filled 2016-05-01: qty 5

## 2016-05-01 MED ORDER — PALONOSETRON HCL INJECTION 0.25 MG/5ML
0.2500 mg | Freq: Once | INTRAVENOUS | Status: AC
  Administered 2016-05-01: 0.25 mg via INTRAVENOUS

## 2016-05-01 MED ORDER — SODIUM CHLORIDE 0.9 % IV SOLN
500.0000 mg/m2 | Freq: Once | INTRAVENOUS | Status: AC
  Administered 2016-05-01: 880 mg via INTRAVENOUS
  Filled 2016-05-01: qty 44

## 2016-05-01 MED ORDER — PALONOSETRON HCL INJECTION 0.25 MG/5ML
INTRAVENOUS | Status: AC
  Filled 2016-05-01: qty 5

## 2016-05-01 MED ORDER — SODIUM CHLORIDE 0.9 % IV SOLN
Freq: Once | INTRAVENOUS | Status: AC
  Administered 2016-05-01: 12:00:00 via INTRAVENOUS

## 2016-05-01 MED ORDER — PEGFILGRASTIM 6 MG/0.6ML ~~LOC~~ PSKT
6.0000 mg | PREFILLED_SYRINGE | Freq: Once | SUBCUTANEOUS | Status: AC
  Administered 2016-05-01: 6 mg via SUBCUTANEOUS
  Filled 2016-05-01: qty 0.6

## 2016-05-01 MED ORDER — SODIUM CHLORIDE 0.9% FLUSH
10.0000 mL | INTRAVENOUS | Status: DC | PRN
  Filled 2016-05-01: qty 10

## 2016-05-01 MED ORDER — HEPARIN SOD (PORK) LOCK FLUSH 100 UNIT/ML IV SOLN
500.0000 [IU] | Freq: Once | INTRAVENOUS | Status: DC | PRN
  Filled 2016-05-01: qty 5

## 2016-05-01 MED ORDER — DOXORUBICIN HCL CHEMO IV INJECTION 2 MG/ML
50.0000 mg/m2 | Freq: Once | INTRAVENOUS | Status: AC
  Administered 2016-05-01: 88 mg via INTRAVENOUS
  Filled 2016-05-01: qty 44

## 2016-05-01 NOTE — Patient Instructions (Signed)
[place] Discharge Instructions for Patients Receiving Chemotherapy  Today you received the following chemotherapy agents Adriamycin and Cytoxan.  To help prevent nausea and vomiting after your treatment, we encourage you to take your nausea medication as directed - NO ZOFRAN FOR 3 DAYS.  If you develop nausea and vomiting that is not controlled by your nausea medication, call the clinic.   BELOW ARE SYMPTOMS THAT SHOULD BE REPORTED IMMEDIATELY:  *FEVER GREATER THAN 100.5 F  *CHILLS WITH OR WITHOUT FEVER  NAUSEA AND VOMITING THAT IS NOT CONTROLLED WITH YOUR NAUSEA MEDICATION  *UNUSUAL SHORTNESS OF BREATH  *UNUSUAL BRUISING OR BLEEDING  TENDERNESS IN MOUTH AND THROAT WITH OR WITHOUT PRESENCE OF ULCERS  *URINARY PROBLEMS  *BOWEL PROBLEMS  UNUSUAL RASH Items with * indicate a potential emergency and should be followed up as soon as possible.  Feel free to call the clinic you have any questions or concerns. The clinic phone number is [phone].  Please show the [place] at check-in to the Emergency Department and triage nurse.   Doxorubicin injection What is this medicine? DOXORUBICIN (dox oh ROO bi sin) is a chemotherapy drug. It is used to treat many kinds of cancer like Hodgkin's disease, leukemia, non-Hodgkin's lymphoma, neuroblastoma, sarcoma, and Wilms' tumor. It is also used to treat bladder cancer, breast cancer, lung cancer, ovarian cancer, stomach cancer, and thyroid cancer. This medicine may be used for other purposes; ask your health care provider or pharmacist if you have questions. What should I tell my health care provider before I take this medicine? They need to know if you have any of these conditions: -blood disorders -heart disease, recent heart attack -infection (especially a virus infection such as chickenpox, cold sores, or herpes) -irregular heartbeat -liver disease -recent or ongoing radiation therapy -an unusual or  allergic reaction to doxorubicin, other chemotherapy agents, other medicines, foods, dyes, or preservatives -pregnant or trying to get pregnant -breast-feeding How should I use this medicine? This drug is given as an infusion into a vein. It is administered in a hospital or clinic by a specially trained health care professional. If you have pain, swelling, burning or any unusual feeling around the site of your injection, tell your health care professional right away. Talk to your pediatrician regarding the use of this medicine in children. Special care may be needed. Overdosage: If you think you have taken too much of this medicine contact a poison control center or emergency room at once. NOTE: This medicine is only for you. Do not share this medicine with others. What if I miss a dose? It is important not to miss your dose. Call your doctor or health care professional if you are unable to keep an appointment. What may interact with this medicine? Do not take this medicine with any of the following medications: -cisapride -droperidol -halofantrine -pimozide -zidovudine This medicine may also interact with the following medications: -chloroquine -chlorpromazine -clarithromycin -cyclophosphamide -cyclosporine -erythromycin -medicines for depression, anxiety, or psychotic disturbances -medicines for irregular heart beat like amiodarone, bepridil, dofetilide, encainide, flecainide, propafenone, quinidine -medicines for seizures like ethotoin, fosphenytoin, phenytoin -medicines for nausea, vomiting like dolasetron, ondansetron, palonosetron -medicines to increase blood counts like filgrastim, pegfilgrastim, sargramostim -methadone -methotrexate -pentamidine -progesterone -vaccines -verapamil Talk to your doctor or health care professional before taking any of these medicines: -acetaminophen -aspirin -ibuprofen -ketoprofen -naproxen This list may not describe all possible  interactions. Give your health care provider a list of all the medicines, herbs, non-prescription drugs, or  dietary supplements you use. Also tell them if you smoke, drink alcohol, or use illegal drugs. Some items may interact with your medicine. What should I watch for while using this medicine? Your condition will be monitored carefully while you are receiving this medicine. You will need important blood work done while you are taking this medicine. This drug may make you feel generally unwell. This is not uncommon, as chemotherapy can affect healthy cells as well as cancer cells. Report any side effects. Continue your course of treatment even though you feel ill unless your doctor tells you to stop. Your urine may turn red for a few days after your dose. This is not blood. If your urine is dark or brown, call your doctor. In some cases, you may be given additional medicines to help with side effects. Follow all directions for their use. Call your doctor or health care professional for advice if you get a fever, chills or sore throat, or other symptoms of a cold or flu. Do not treat yourself. This drug decreases your body's ability to fight infections. Try to avoid being around people who are sick. This medicine may increase your risk to bruise or bleed. Call your doctor or health care professional if you notice any unusual bleeding. Be careful brushing and flossing your teeth or using a toothpick because you may get an infection or bleed more easily. If you have any dental work done, tell your dentist you are receiving this medicine. Avoid taking products that contain aspirin, acetaminophen, ibuprofen, naproxen, or ketoprofen unless instructed by your doctor. These medicines may hide a fever. Men and women of childbearing age should use effective birth control methods while using taking this medicine. Do not become pregnant while taking this medicine. There is a potential for serious side effects to an  unborn child. Talk to your health care professional or pharmacist for more information. Do not breast-feed an infant while taking this medicine. Do not let others touch your urine or other body fluids for 5 days after each treatment with this medicine. Caregivers should wear latex gloves to avoid touching body fluids during this time. There is a maximum amount of this medicine you should receive throughout your life. The amount depends on the medical condition being treated and your overall health. Your doctor will watch how much of this medicine you receive in your lifetime. Tell your doctor if you have taken this medicine before. What side effects may I notice from receiving this medicine? Side effects that you should report to your doctor or health care professional as soon as possible: -allergic reactions like skin rash, itching or hives, swelling of the face, lips, or tongue -low blood counts - this medicine may decrease the number of white blood cells, red blood cells and platelets. You may be at increased risk for infections and bleeding. -signs of infection - fever or chills, cough, sore throat, pain or difficulty passing urine -signs of decreased platelets or bleeding - bruising, pinpoint red spots on the skin, black, tarry stools, blood in the urine -signs of decreased red blood cells - unusually weak or tired, fainting spells, lightheadedness -breathing problems -chest pain -fast, irregular heartbeat -mouth sores -nausea, vomiting -pain, swelling, redness at site where injected -pain, tingling, numbness in the hands or feet -swelling of ankles, feet, or hands -unusual bleeding or bruising Side effects that usually do not require medical attention (report to your doctor or health care professional if they continue or are bothersome): -diarrhea -facial  flushing -hair loss -loss of appetite -missed menstrual periods -nail discoloration or damage -red or watery eyes -red colored  urine -stomach upset This list may not describe all possible side effects. Call your doctor for medical advice about side effects. You may report side effects to FDA at 1-800-FDA-1088. Where should I keep my medicine? This drug is given in a hospital or clinic and will not be stored at home. NOTE: This sheet is a summary. It may not cover all possible information. If you have questions about this medicine, talk to your doctor, pharmacist, or health care provider.    2016, [employer]. (2013-03-15 09:54:34)     Cyclophosphamide injection What is this medicine? CYCLOPHOSPHAMIDE (sye kloe FOSS fa mide) is a chemotherapy drug. It slows the growth of cancer cells. This medicine is used to treat many types of cancer like lymphoma, myeloma, leukemia, breast cancer, and ovarian cancer, to name a few. This medicine may be used for other purposes; ask your health care provider or pharmacist if you have questions. What should I tell my health care provider before I take this medicine? They need to know if you have any of these conditions: -blood disorders -history of other chemotherapy -infection -kidney disease -liver disease -recent or ongoing radiation therapy -tumors in the bone marrow -an unusual or allergic reaction to cyclophosphamide, other chemotherapy, other medicines, foods, dyes, or preservatives -pregnant or trying to get pregnant -breast-feeding How should I use this medicine? This drug is usually given as an injection into a vein or muscle or by infusion into a vein. It is administered in a hospital or clinic by a specially trained health care professional. Talk to your pediatrician regarding the use of this medicine in children. Special care may be needed. Overdosage: If you think you have taken too much of this medicine contact a poison control center or emergency room at once. NOTE: This medicine is only for you. Do not share this medicine with others. What if I  miss a dose? It is important not to miss your dose. Call your doctor or health care professional if you are unable to keep an appointment. What may interact with this medicine? This medicine may interact with the following medications: -amiodarone -amphotericin B -azathioprine -certain antiviral medicines for HIV or AIDS such as protease inhibitors (e.g., indinavir, ritonavir) and zidovudine -certain blood pressure medications such as benazepril, captopril, enalapril, fosinopril, lisinopril, moexipril, monopril, perindopril, quinapril, ramipril, trandolapril -certain cancer medications such as anthracyclines (e.g., daunorubicin, doxorubicin), busulfan, cytarabine, paclitaxel, pentostatin, tamoxifen, trastuzumab -certain diuretics such as chlorothiazide, chlorthalidone, hydrochlorothiazide, indapamide, metolazone -certain medicines that treat or prevent blood clots like warfarin -certain muscle relaxants such as succinylcholine -cyclosporine -etanercept -indomethacin -medicines to increase blood counts like filgrastim, pegfilgrastim, sargramostim -medicines used as general anesthesia -metronidazole -natalizumab This list may not describe all possible interactions. Give your health care provider a list of all the medicines, herbs, non-prescription drugs, or dietary supplements you use. Also tell them if you smoke, drink alcohol, or use illegal drugs. Some items may interact with your medicine. What should I watch for while using this medicine? Visit your doctor for checks on your progress. This drug may make you feel generally unwell. This is not uncommon, as chemotherapy can affect healthy cells as well as cancer cells. Report any side effects. Continue your course of treatment even though you feel ill unless your doctor tells you to stop. Drink water or other fluids as directed. Urinate often, even at night. In some cases,  you may be given additional medicines to help with side effects.  Follow all directions for their use. Call your doctor or health care professional for advice if you get a fever, chills or sore throat, or other symptoms of a cold or flu. Do not treat yourself. This drug decreases your body's ability to fight infections. Try to avoid being around people who are sick. This medicine may increase your risk to bruise or bleed. Call your doctor or health care professional if you notice any unusual bleeding. Be careful brushing and flossing your teeth or using a toothpick because you may get an infection or bleed more easily. If you have any dental work done, tell your dentist you are receiving this medicine. You may get drowsy or dizzy. Do not drive, use machinery, or do anything that needs mental alertness until you know how this medicine affects you. Do not become pregnant while taking this medicine or for 1 year after stopping it. Women should inform their doctor if they wish to become pregnant or think they might be pregnant. Men should not father a child while taking this medicine and for 4 months after stopping it. There is a potential for serious side effects to an unborn child. Talk to your health care professional or pharmacist for more information. Do not breast-feed an infant while taking this medicine. This medicine may interfere with the ability to have a child. This medicine has caused ovarian failure in some women. This medicine has caused reduced sperm counts in some men. You should talk with your doctor or health care professional if you are concerned about your fertility. If you are going to have surgery, tell your doctor or health care professional that you have taken this medicine. What side effects may I notice from receiving this medicine? Side effects that you should report to your doctor or health care professional as soon as possible: -allergic reactions like skin rash, itching or hives, swelling of the face, lips, or tongue -low blood counts - this  medicine may decrease the number of white blood cells, red blood cells and platelets. You may be at increased risk for infections and bleeding. -signs of infection - fever or chills, cough, sore throat, pain or difficulty passing urine -signs of decreased platelets or bleeding - bruising, pinpoint red spots on the skin, black, tarry stools, blood in the urine -signs of decreased red blood cells - unusually weak or tired, fainting spells, lightheadedness -breathing problems -dark urine -dizziness -palpitations -swelling of the ankles, feet, hands -trouble passing urine or change in the amount of urine -weight gain -yellowing of the eyes or skin Side effects that usually do not require medical attention (report to your doctor or health care professional if they continue or are bothersome): -changes in nail or skin color -hair loss -missed menstrual periods -mouth sores -nausea, vomiting This list may not describe all possible side effects. Call your doctor for medical advice about side effects. You may report side effects to FDA at 1-800-FDA-1088. Where should I keep my medicine? This drug is given in a hospital or clinic and will not be stored at home. NOTE: This sheet is a summary. It may not cover all possible information. If you have questions about this medicine, talk to your doctor, pharmacist, or health care provider.    2016, [employer]. (2012-10-01 16:22:58)  Pegfilgrastim injection What is this medicine? PEGFILGRASTIM (PEG fil gra stim) is a long-acting granulocyte colony-stimulating factor that stimulates the growth of neutrophils, a  type of white blood cell important in the body's fight against infection. It is used to reduce the incidence of fever and infection in patients with certain types of cancer who are receiving chemotherapy that affects the bone marrow, and to increase survival after being exposed to high doses of radiation. This medicine may be used for  other purposes; ask your health care provider or pharmacist if you have questions. What should I tell my health care provider before I take this medicine? They need to know if you have any of these conditions: -kidney disease -latex allergy -ongoing radiation therapy -sickle cell disease -skin reactions to acrylic adhesives (On-Body Injector only) -an unusual or allergic reaction to pegfilgrastim, filgrastim, other medicines, foods, dyes, or preservatives -pregnant or trying to get pregnant -breast-feeding How should I use this medicine? This medicine is for injection under the skin. If you get this medicine at home, you will be taught how to prepare and give the pre-filled syringe or how to use the On-body Injector. Refer to the patient Instructions for Use for detailed instructions. Use exactly as directed. Take your medicine at regular intervals. Do not take your medicine more often than directed. It is important that you put your used needles and syringes in a special sharps container. Do not put them in a trash can. If you do not have a sharps container, call your pharmacist or healthcare provider to get one. Talk to your pediatrician regarding the use of this medicine in children. While this drug may be prescribed for selected conditions, precautions do apply. Overdosage: If you think you have taken too much of this medicine contact a poison control center or emergency room at once. NOTE: This medicine is only for you. Do not share this medicine with others. What if I miss a dose? It is important not to miss your dose. Call your doctor or health care professional if you miss your dose. If you miss a dose due to an On-body Injector failure or leakage, a new dose should be administered as soon as possible using a single prefilled syringe for manual use. What may interact with this medicine? Interactions have not been studied. Give your health care provider a list of all the medicines, herbs,  non-prescription drugs, or dietary supplements you use. Also tell them if you smoke, drink alcohol, or use illegal drugs. Some items may interact with your medicine. This list may not describe all possible interactions. Give your health care provider a list of all the medicines, herbs, non-prescription drugs, or dietary supplements you use. Also tell them if you smoke, drink alcohol, or use illegal drugs. Some items may interact with your medicine. What should I watch for while using this medicine? You may need blood work done while you are taking this medicine. If you are going to need a MRI, CT scan, or other procedure, tell your doctor that you are using this medicine (On-Body Injector only). What side effects may I notice from receiving this medicine? Side effects that you should report to your doctor or health care professional as soon as possible: -allergic reactions like skin rash, itching or hives, swelling of the face, lips, or tongue -dizziness -fever -pain, redness, or irritation at site where injected -pinpoint red spots on the skin -red or dark-brown urine -shortness of breath or breathing problems -stomach or side pain, or pain at the shoulder -swelling -tiredness -trouble passing urine or change in the amount of urine Side effects that usually do not require medical  attention (report to your doctor or health care professional if they continue or are bothersome): -bone pain -muscle pain This list may not describe all possible side effects. Call your doctor for medical advice about side effects. You may report side effects to FDA at 1-800-FDA-1088. Where should I keep my medicine? Keep out of the reach of children. Store pre-filled syringes in a refrigerator between 2 and 8 degrees C (36 and 46 degrees F). Do not freeze. Keep in carton to protect from light. Throw away this medicine if it is left out of the refrigerator for more than 48 hours. Throw away any unused medicine after  the expiration date. NOTE: This sheet is a summary. It may not cover all possible information. If you have questions about this medicine, talk to your doctor, pharmacist, or health care provider.    2016, [employer]. (2014-12-07 14:30:14)

## 2016-05-01 NOTE — Progress Notes (Signed)
Patient Care Team: [name], MD as PCP - General Macarthur Critchley, OD as Referring Physician (Optometry) [name], MD as Consulting Physician (Dermatology) Latanya Maudlin, MD as Consulting Physician (Orthopedic Surgery) Inda Castle, MD as Consulting Physician (Gastroenterology)  DIAGNOSIS: Breast cancer of upper-inner quadrant of left female breast [facility])   Staging form: Breast, AJCC 7th Edition     Clinical stage from 03/05/2016: Stage IIA (T2, N0, M0) - Unsigned     Pathologic: Stage IIB (T3, N0, cM0) - Signed by [name], MD on 03/27/2016   SUMMARY OF ONCOLOGIC HISTORY:   Breast cancer of upper-inner quadrant of left female breast ([place])   02/26/2016 Initial Diagnosis Left breast biopsy 11:00 position: invasive ductal carcinoma with DCIS, ER 90%, PR 10%, HER-2 negative, Ki-67 30%, grade 2, 2.2 cm palpable lesion T2 N0 stage II a clinical stage   03/18/2016 Surgery Left lumpectomy: Invasive ductal carcinoma, grade 2, 6.3 cm, with high-grade DCIS, margins negative, 0/4 lymph nodes negative, ER 90%, via 10%, HER-2 negative ratio 0.97, Ki-67 30%, T3 N0 stage IIB   03/25/2016 Procedure Genetic testing is negative for pathogenic mutations within any of the 20 Genes on the breast/ovarian cancer panel   04/04/2016 Oncotype testing Oncotype DX recurrence score 37, 25% 10 year distant risk of recurrence   04/17/2016 -  Chemotherapy Adjuvant chemotherapy with dose dense Adriamycin and Cytoxan followed by Abraxane weekly 12    CHIEF COMPLIANT: Cycle 2 dose dense Adriamycin and Cytoxan  INTERVAL HISTORY: Kayla Baker is a 80 year old with above-mentioned history of left breast cancer currently on adjuvant chemotherapy with dose dense Adriamycin Cytoxan. Today is cycle 2. She done extremely well from chemotherapy standpoint. She spent a week at the beach and did very well. Did not have any nausea or vomiting. She did have mild fatigue.  REVIEW OF SYSTEMS:   Constitutional: Denies fevers, chills  or abnormal weight loss Eyes: Denies blurriness of vision Ears, nose, mouth, throat, and face: Denies mucositis or sore throat Respiratory: Denies cough, dyspnea or wheezes Cardiovascular: Denies palpitation, chest discomfort Gastrointestinal:  Denies nausea, heartburn or change in bowel habits Skin: Denies abnormal skin rashes Lymphatics: Denies new lymphadenopathy or easy bruising Neurological:Denies numbness, tingling or new weaknesses Behavioral/Psych: Mood is stable, no new changes  Extremities: No lower extremity edema Breast:  denies any pain or lumps or nodules in either breasts All other systems were reviewed with the patient and are negative.  I have reviewed the past medical history, past surgical history, social history and family history with the patient and they are unchanged from previous note.  ALLERGIES:  is allergic to keflex; meloxicam; prednisone; premarin; and trovan.  MEDICATIONS:  Current Outpatient Prescriptions  Medication Sig Dispense Refill  . allopurinol (ZYLOPRIM) 300 MG tablet TAKE 1 TABLET BY MOUTH EVERY DAY 90 tablet 1  . amitriptyline (ELAVIL) 25 MG tablet Take 1 to 2 tablets at bedtime for neuritis 180 tablet 1  . aspirin 81 MG tablet Take 81 mg by mouth daily.    Marland Kitchen atorvastatin (LIPITOR) 40 MG tablet TAKE 1 TABLET BY MOUTH EVERY DAY 90 tablet 1  . chlorhexidine (HIBICLENS) 4 % external liquid Apply 15 mLs (1 application total) topically once. (Patient not taking: Reported on 04/16/2016) 120 mL 0  . chlorhexidine (HIBICLENS) 4 % external liquid Apply 15 mLs (1 application total) topically once. (Patient not taking: Reported on 04/16/2016) 120 mL 0  . cholecalciferol (VITAMIN D) 1000 UNITS tablet Take 1,000 Units by mouth daily.    Marland Kitchen  CINNAMON PO Take 1,000 mg by mouth 2 (two) times daily.    Marland Kitchen dexamethasone (DECADRON) 4 MG tablet Take 1 tablet (4 mg total) by mouth daily. 1 tab daily X 3 days (Patient not taking: Reported on 04/16/2016) 30 tablet 1  .  enalapril (VASOTEC) 20 MG tablet TAKE 1 TABLET BY MOUTH DAILY 90 tablet 1  . FREESTYLE LITE test strip USE ONE STRIP TO CHECK GLUCOSE ONCE DAILY. 100 each 0  . gabapentin (NEURONTIN) 100 MG capsule Take 1 to 3 capsules at bedtime for neuritis 90 capsule 5  . hydrochlorothiazide (HYDRODIURIL) 25 MG tablet TAKE 1 TABLET BY MOUTH DAILY 90 tablet 3  . lactated ringers infusion Inject 125 mLs into the vein continuous. (Patient not taking: Reported on 04/16/2016) 250 mL 0  . lidocaine-prilocaine (EMLA) cream Apply to affected area once (Patient not taking: Reported on 04/16/2016) 30 g 3  . LORazepam (ATIVAN) 0.5 MG tablet Take 1 tablet (0.5 mg total) by mouth at bedtime. (Patient not taking: Reported on 04/16/2016) 30 tablet 0  . Magnesium 250 MG TABS Take 500 mg by mouth 2 (two) times daily. Takes 2 tabs in am and 2 tabs in pm    . Multiple Vitamin (MULTIVITAMIN) tablet Take 1 tablet by mouth daily.    . Omega-3 Fatty Acids (FISH OIL) 1200 MG CAPS Take 2,400 mg by mouth daily. Takes 2 capsules daily    . ondansetron (ZOFRAN) 8 MG tablet Take 1 tablet (8 mg total) by mouth 2 (two) times daily as needed. Start on the third day after chemotherapy. (Patient not taking: Reported on 04/16/2016) 30 tablet 1  . prochlorperazine (COMPAZINE) 10 MG tablet Take 1 tablet (10 mg total) by mouth every 6 (six) hours as needed (Nausea or vomiting). (Patient not taking: Reported on 04/16/2016) 30 tablet 1  . ranitidine (ZANTAC) 300 MG tablet Take twice a day as needed for Heartburn & Acid Indigestion (Patient not taking: Reported on 04/16/2016) 60 tablet 3  . vitamin B-12 (CYANOCOBALAMIN) 1000 MCG tablet Take 1,000 mcg by mouth daily. Takes 1 tablet every other day    . vitamin C (ASCORBIC ACID) 500 MG tablet Take 500 mg by mouth daily. Takes 2 tablets daily     No current facility-administered medications for this visit.    PHYSICAL EXAMINATION: ECOG PERFORMANCE STATUS: 0 - Asymptomatic  Filed Vitals:   05/01/16 1036    BP: 132/70  Pulse: 107  Temp: 97.9 F (36.6 C)  Resp: 18   Filed Weights   05/01/16 1036  Weight: 154 lb (69.854 kg)    GENERAL:alert, no distress and comfortable SKIN: skin color, texture, turgor are normal, no rashes or significant lesions EYES: normal, Conjunctiva are pink and non-injected, sclera clear OROPHARYNX:no exudate, no erythema and lips, buccal mucosa, and tongue normal  NECK: supple, thyroid normal size, non-tender, without nodularity LYMPH:  no palpable lymphadenopathy in the cervical, axillary or inguinal LUNGS: clear to auscultation and percussion with normal breathing effort HEART: regular rate & rhythm and no murmurs and no lower extremity edema ABDOMEN:abdomen soft, non-tender and normal bowel sounds MUSCULOSKELETAL:no cyanosis of digits and no clubbing  NEURO: alert & oriented x 3 with fluent speech, no focal motor/sensory deficits EXTREMITIES: No lower extremity edema  LABORATORY DATA:  I have reviewed the data as listed   Chemistry      Component Value Date/Time   NA 138 04/24/2016 1446   NA 140 04/02/2016 0940   K 4.9 04/24/2016 1446   K 3.9  04/02/2016 0940   CL 103 04/02/2016 0940   CO2 28 04/24/2016 1446   CO2 26 04/02/2016 0940   BUN 36.6* 04/24/2016 1446   BUN 25 04/02/2016 0940   CREATININE 1.6* 04/24/2016 1446   CREATININE 1.40* 04/02/2016 0940   CREATININE 1.50* 03/18/2016 1116      Component Value Date/Time   CALCIUM 9.6 04/24/2016 1446   CALCIUM 9.7 04/02/2016 0940   ALKPHOS 138 04/24/2016 1446   ALKPHOS 115 04/02/2016 0940   AST 44* 04/24/2016 1446   AST 43* 04/02/2016 0940   ALT 71* 04/24/2016 1446   ALT 70* 04/02/2016 0940   BILITOT 0.51 04/24/2016 1446   BILITOT 0.4 04/02/2016 0940       Lab Results  Component Value Date   WBC 14.8* 05/01/2016   HGB 11.1* 05/01/2016   HCT 33.9* 05/01/2016   MCV 90.2 05/01/2016   PLT 165 05/01/2016   NEUTROABS 11.6* 05/01/2016     ASSESSMENT & PLAN:  Breast cancer of  upper-inner quadrant of left female breast ([place]) Left lumpectomy 03/08/2016: Invasive ductal carcinoma, grade 2, 6.3 cm, with high-grade DCIS, margins negative, 0/4 lymph nodes negative, ER 90%, via 10%, HER-2 negative ratio 0.97, Ki-67 30%, T3 N0 stage IIB  Oncotype DX counseling: Patient has a score of 11 which translates into 25% 10 year risk of distant recurrence with tamoxifen alone. Based on this, I strongly recommend systemic chemotherapy.  Treatment plan: 1. Adjuvant chemotherapy with dose dense Adriamycin and Cytoxan 4 followed by Abraxane weekly 12 2. Adjuvant radiation therapy followed by 3. Adjuvant antiestrogen therapy ---------------------------------------------------------------------------------------------- Current treatment: Cycle 2 day 1 of dose dense Adriamycin and Cytoxan (started at a slightly lower dose given her age) Echocardiogram revealed EF of 60-65% on 04/16/2016  Chemotherapy toxicities: 1. Fatigue due to chemotherapy  2. Elevated ALT: Not due to chemotherapy, we will continue to watch and monitor this  Denied any nausea/ vomiting Patient enjoyed a vacation at the beach  Return to clinic in 2 weeks for cycle 3  No orders of the defined types were placed in this encounter.   The patient has a good understanding of the overall plan. she agrees with it. she will call with any problems that may develop before the next visit here.   Rulon Eisenmenger, MD 05/01/2016

## 2016-05-01 NOTE — Assessment & Plan Note (Signed)
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 Based on this, I strongly recommend systemic chemotherapy.  Treatment plan: 1. Adjuvant chemotherapy with dose dense Adriamycin and Cytoxan 4 followed by Abraxane weekly 12 2. Adjuvant radiation therapy followed by 3. Adjuvant antiestrogen therapy ---------------------------------------------------------------------------------------------- Current treatment: Cycle 2 day 1 of dose dense Adriamycin and Cytoxan (started at a slightly lower dose given her age) Echocardiogram revealed EF of 60-65% on 04/16/2016  Chemotherapy toxicities: 1. Fatigue due to chemotherapy  Return to clinic in 2 weeks for cycle 3

## 2016-05-01 NOTE — Progress Notes (Signed)
Okay to treat with today's creatinine of 1.5 per [name].

## 2016-05-02 ENCOUNTER — Telehealth: Payer: Self-pay | Admitting: Hematology and Oncology

## 2016-05-02 NOTE — Telephone Encounter (Signed)
lvm to inform patient of added appt to 6/15 per 6/1 pof

## 2016-05-15 ENCOUNTER — Telehealth: Payer: Self-pay | Admitting: Hematology and Oncology

## 2016-05-15 ENCOUNTER — Encounter: Payer: Self-pay | Admitting: Hematology and Oncology

## 2016-05-15 ENCOUNTER — Other Ambulatory Visit (HOSPITAL_BASED_OUTPATIENT_CLINIC_OR_DEPARTMENT_OTHER): Payer: PPO

## 2016-05-15 ENCOUNTER — Ambulatory Visit (HOSPITAL_BASED_OUTPATIENT_CLINIC_OR_DEPARTMENT_OTHER): Payer: PPO | Admitting: Hematology and Oncology

## 2016-05-15 ENCOUNTER — Ambulatory Visit (HOSPITAL_BASED_OUTPATIENT_CLINIC_OR_DEPARTMENT_OTHER): Payer: PPO

## 2016-05-15 VITALS — BP 138/66 | HR 105 | Temp 98.0°F | Resp 18 | Ht 64.0 in | Wt 154.8 lb

## 2016-05-15 VITALS — BP 128/60 | HR 51 | Temp 98.2°F | Resp 19

## 2016-05-15 DIAGNOSIS — Z5111 Encounter for antineoplastic chemotherapy: Secondary | ICD-10-CM | POA: Diagnosis not present

## 2016-05-15 DIAGNOSIS — C50212 Malignant neoplasm of upper-inner quadrant of left female breast: Secondary | ICD-10-CM

## 2016-05-15 DIAGNOSIS — Z170 Estrogen receptor positive status [ER+]: Secondary | ICD-10-CM | POA: Diagnosis not present

## 2016-05-15 DIAGNOSIS — Z5189 Encounter for other specified aftercare: Secondary | ICD-10-CM | POA: Diagnosis not present

## 2016-05-15 DIAGNOSIS — D6481 Anemia due to antineoplastic chemotherapy: Secondary | ICD-10-CM | POA: Diagnosis not present

## 2016-05-15 LAB — COMPREHENSIVE METABOLIC PANEL
ALBUMIN: 3.3 g/dL — AB (ref 3.5–5.0)
ALK PHOS: 192 U/L — AB (ref 40–150)
ALT: 64 U/L — AB (ref 0–55)
ANION GAP: 8 meq/L (ref 3–11)
AST: 33 U/L (ref 5–34)
BUN: 26.2 mg/dL — ABNORMAL HIGH (ref 7.0–26.0)
CO2: 28 meq/L (ref 22–29)
CREATININE: 1.4 mg/dL — AB (ref 0.6–1.1)
Calcium: 10 mg/dL (ref 8.4–10.4)
Chloride: 106 mEq/L (ref 98–109)
EGFR: 35 mL/min/{1.73_m2} — AB (ref >90)
GLUCOSE: 117 mg/dL (ref 70–140)
Potassium: 4.6 mEq/L (ref 3.5–5.1)
SODIUM: 142 meq/L (ref 136–145)
TOTAL PROTEIN: 6.5 g/dL (ref 6.4–8.3)

## 2016-05-15 LAB — CBC WITH DIFFERENTIAL/PLATELET
BASO%: 1.4 % (ref 0.0–2.0)
Basophils Absolute: 0.3 10*3/uL — ABNORMAL HIGH (ref 0.0–0.1)
EOS ABS: 0 10*3/uL (ref 0.0–0.5)
EOS%: 0.1 % (ref 0.0–7.0)
HCT: 29 % — ABNORMAL LOW (ref 34.8–46.6)
HEMOGLOBIN: 9.6 g/dL — AB (ref 11.6–15.9)
LYMPH%: 8.7 % — ABNORMAL LOW (ref 14.0–49.7)
MCH: 29.7 pg (ref 25.1–34.0)
MCHC: 33.1 g/dL (ref 31.5–36.0)
MCV: 89.8 fL (ref 79.5–101.0)
MONO#: 1.7 10*3/uL — AB (ref 0.1–0.9)
MONO%: 6.8 % (ref 0.0–14.0)
NEUT%: 83 % — ABNORMAL HIGH (ref 38.4–76.8)
NEUTROS ABS: 20.4 10*3/uL — AB (ref 1.5–6.5)
PLATELETS: 158 10*3/uL (ref 145–400)
RBC: 3.23 10*6/uL — AB (ref 3.70–5.45)
RDW: 15.1 % — AB (ref 11.2–14.5)
WBC: 24.6 10*3/uL — AB (ref 3.9–10.3)
lymph#: 2.1 10*3/uL (ref 0.9–3.3)

## 2016-05-15 MED ORDER — SODIUM CHLORIDE 0.9 % IV SOLN
500.0000 mg/m2 | Freq: Once | INTRAVENOUS | Status: AC
  Administered 2016-05-15: 880 mg via INTRAVENOUS
  Filled 2016-05-15: qty 44

## 2016-05-15 MED ORDER — SODIUM CHLORIDE 0.9 % IV SOLN
Freq: Once | INTRAVENOUS | Status: AC
  Administered 2016-05-15: 12:00:00 via INTRAVENOUS

## 2016-05-15 MED ORDER — HEPARIN SOD (PORK) LOCK FLUSH 100 UNIT/ML IV SOLN
500.0000 [IU] | Freq: Once | INTRAVENOUS | Status: AC | PRN
  Administered 2016-05-15: 500 [IU]
  Filled 2016-05-15: qty 5

## 2016-05-15 MED ORDER — PEGFILGRASTIM 6 MG/0.6ML ~~LOC~~ PSKT
6.0000 mg | PREFILLED_SYRINGE | Freq: Once | SUBCUTANEOUS | Status: AC
  Administered 2016-05-15: 6 mg via SUBCUTANEOUS
  Filled 2016-05-15: qty 0.6

## 2016-05-15 MED ORDER — DOXORUBICIN HCL CHEMO IV INJECTION 2 MG/ML
50.0000 mg/m2 | Freq: Once | INTRAVENOUS | Status: AC
  Administered 2016-05-15: 88 mg via INTRAVENOUS
  Filled 2016-05-15: qty 44

## 2016-05-15 MED ORDER — PALONOSETRON HCL INJECTION 0.25 MG/5ML
INTRAVENOUS | Status: AC
  Filled 2016-05-15: qty 5

## 2016-05-15 MED ORDER — SODIUM CHLORIDE 0.9% FLUSH
10.0000 mL | INTRAVENOUS | Status: DC | PRN
  Administered 2016-05-15: 10 mL
  Filled 2016-05-15: qty 10

## 2016-05-15 MED ORDER — SODIUM CHLORIDE 0.9 % IV SOLN
Freq: Once | INTRAVENOUS | Status: AC
  Administered 2016-05-15: 12:00:00 via INTRAVENOUS
  Filled 2016-05-15: qty 5

## 2016-05-15 MED ORDER — PALONOSETRON HCL INJECTION 0.25 MG/5ML
0.2500 mg | Freq: Once | INTRAVENOUS | Status: AC
  Administered 2016-05-15: 0.25 mg via INTRAVENOUS

## 2016-05-15 NOTE — Assessment & Plan Note (Signed)
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 Based on this, I strongly recommend systemic chemotherapy.  Treatment plan: 1. Adjuvant chemotherapy with dose dense Adriamycin and Cytoxan 4 followed by Abraxane weekly 12 2. Adjuvant radiation therapy followed by 3. Adjuvant antiestrogen therapy ---------------------------------------------------------------------------------------------- Current treatment: Cycle 3 day 1 of dose dense Adriamycin and Cytoxan (started at a slightly lower dose given her age) Echocardiogram revealed EF of 60-65% on 04/16/2016  Chemotherapy toxicities: 1. Fatigue due to chemotherapy  2. Elevated ALT: Not due to chemotherapy, we will continue to watch and monitor this  Denied any nausea/ vomiting Patient enjoyed a vacation at the beach  Return to clinic in 2 weeks for cycle 4

## 2016-05-15 NOTE — Progress Notes (Signed)
Patient Care Team: [name], MD as PCP - General Macarthur Critchley, OD as Referring Physician (Optometry) [name], MD as Consulting Physician (Dermatology) Latanya Maudlin, MD as Consulting Physician (Orthopedic Surgery) Inda Castle, MD as Consulting Physician (Gastroenterology)  DIAGNOSIS: Breast cancer of upper-inner quadrant of left female breast [facility])   Staging form: Breast, AJCC 7th Edition     Clinical stage from 03/05/2016: Stage IIA (T2, N0, M0) - Unsigned     Pathologic: Stage IIB (T3, N0, cM0) - Signed by [name], MD on 03/27/2016   SUMMARY OF ONCOLOGIC HISTORY:   Breast cancer of upper-inner quadrant of left female breast ([place])   02/26/2016 Initial Diagnosis Left breast biopsy 11:00 position: invasive ductal carcinoma with DCIS, ER 90%, PR 10%, HER-2 negative, Ki-67 30%, grade 2, 2.2 cm palpable lesion T2 N0 stage II a clinical stage   03/18/2016 Surgery Left lumpectomy: Invasive ductal carcinoma, grade 2, 6.3 cm, with high-grade DCIS, margins negative, 0/4 lymph nodes negative, ER 90%, via 10%, HER-2 negative ratio 0.97, Ki-67 30%, T3 N0 stage IIB   03/25/2016 Procedure Genetic testing is negative for pathogenic mutations within any of the 20 Genes on the breast/ovarian cancer panel   04/04/2016 Oncotype testing Oncotype DX recurrence score 37, 25% 10 year distant risk of recurrence   04/17/2016 -  Chemotherapy Adjuvant chemotherapy with dose dense Adriamycin and Cytoxan followed by Abraxane weekly 12    CHIEF COMPLIANT: Cycle 4 of dose dense Adriamycin and Cytoxan  INTERVAL HISTORY: Kayla Baker is a 80 year old with above-mentioned history left breast cancer currently on adjuvant chemotherapy and today's cycle 4 of dose dense ligaments and Cytoxan. Overall patient has been tolerating chemotherapy extremely well. She does have fatigue due to chemotherapy. She does not have any nausea or vomiting.  REVIEW OF SYSTEMS:   Constitutional: Denies fevers, chills or abnormal  weight loss Eyes: Denies blurriness of vision Ears, nose, mouth, throat, and face: Denies mucositis or sore throat Respiratory: Denies cough, dyspnea or wheezes Cardiovascular: Denies palpitation, chest discomfort Gastrointestinal:  Denies nausea, heartburn or change in bowel habits Skin: Denies abnormal skin rashes Lymphatics: Denies new lymphadenopathy or easy bruising Neurological:Denies numbness, tingling or new weaknesses Behavioral/Psych: Mood is stable, no new changes  Extremities: No lower extremity edema Breast:  denies any pain or lumps or nodules in either breasts All other systems were reviewed with the patient and are negative.  I have reviewed the past medical history, past surgical history, social history and family history with the patient and they are unchanged from previous note.  ALLERGIES:  is allergic to keflex; meloxicam; prednisone; premarin; and trovan.  MEDICATIONS:  Current Outpatient Prescriptions  Medication Sig Dispense Refill  . allopurinol (ZYLOPRIM) 300 MG tablet TAKE 1 TABLET BY MOUTH EVERY DAY 90 tablet 1  . amitriptyline (ELAVIL) 25 MG tablet Take 1 to 2 tablets at bedtime for neuritis 180 tablet 1  . aspirin 81 MG tablet Take 81 mg by mouth daily.    Marland Kitchen atorvastatin (LIPITOR) 40 MG tablet TAKE 1 TABLET BY MOUTH EVERY DAY 90 tablet 1  . chlorhexidine (HIBICLENS) 4 % external liquid Apply 15 mLs (1 application total) topically once. (Patient not taking: Reported on 04/16/2016) 120 mL 0  . chlorhexidine (HIBICLENS) 4 % external liquid Apply 15 mLs (1 application total) topically once. (Patient not taking: Reported on 04/16/2016) 120 mL 0  . cholecalciferol (VITAMIN D) 1000 UNITS tablet Take 1,000 Units by mouth daily.    Marland Kitchen CINNAMON PO Take  1,000 mg by mouth 2 (two) times daily.    Marland Kitchen dexamethasone (DECADRON) 4 MG tablet Take 1 tablet (4 mg total) by mouth daily. 1 tab daily X 3 days (Patient not taking: Reported on 04/16/2016) 30 tablet 1  . enalapril  (VASOTEC) 20 MG tablet TAKE 1 TABLET BY MOUTH DAILY 90 tablet 1  . FREESTYLE LITE test strip USE ONE STRIP TO CHECK GLUCOSE ONCE DAILY. 100 each 0  . gabapentin (NEURONTIN) 100 MG capsule Take 1 to 3 capsules at bedtime for neuritis 90 capsule 5  . hydrochlorothiazide (HYDRODIURIL) 25 MG tablet TAKE 1 TABLET BY MOUTH DAILY 90 tablet 3  . lactated ringers infusion Inject 125 mLs into the vein continuous. (Patient not taking: Reported on 04/16/2016) 250 mL 0  . lidocaine-prilocaine (EMLA) cream Apply to affected area once (Patient not taking: Reported on 04/16/2016) 30 g 3  . LORazepam (ATIVAN) 0.5 MG tablet Take 1 tablet (0.5 mg total) by mouth at bedtime. (Patient not taking: Reported on 04/16/2016) 30 tablet 0  . Magnesium 250 MG TABS Take 500 mg by mouth 2 (two) times daily. Takes 2 tabs in am and 2 tabs in pm    . Multiple Vitamin (MULTIVITAMIN) tablet Take 1 tablet by mouth daily.    . Omega-3 Fatty Acids (FISH OIL) 1200 MG CAPS Take 2,400 mg by mouth daily. Takes 2 capsules daily    . ondansetron (ZOFRAN) 8 MG tablet Take 1 tablet (8 mg total) by mouth 2 (two) times daily as needed. Start on the third day after chemotherapy. (Patient not taking: Reported on 04/16/2016) 30 tablet 1  . prochlorperazine (COMPAZINE) 10 MG tablet Take 1 tablet (10 mg total) by mouth every 6 (six) hours as needed (Nausea or vomiting). (Patient not taking: Reported on 04/16/2016) 30 tablet 1  . ranitidine (ZANTAC) 300 MG tablet Take twice a day as needed for Heartburn & Acid Indigestion (Patient not taking: Reported on 04/16/2016) 60 tablet 3  . vitamin B-12 (CYANOCOBALAMIN) 1000 MCG tablet Take 1,000 mcg by mouth daily. Takes 1 tablet every other day    . vitamin C (ASCORBIC ACID) 500 MG tablet Take 500 mg by mouth daily. Takes 2 tablets daily     No current facility-administered medications for this visit.    PHYSICAL EXAMINATION: ECOG PERFORMANCE STATUS: 1 - Symptomatic but completely ambulatory  Filed Vitals:    05/15/16 1049  BP: 138/66  Pulse: 105  Temp: 98 F (36.7 C)  Resp: 18   Filed Weights   05/15/16 1049  Weight: 154 lb 12.8 oz (70.217 kg)    GENERAL:alert, no distress and comfortable SKIN: skin color, texture, turgor are normal, no rashes or significant lesions EYES: normal, Conjunctiva are pink and non-injected, sclera clear OROPHARYNX:no exudate, no erythema and lips, buccal mucosa, and tongue normal  NECK: supple, thyroid normal size, non-tender, without nodularity LYMPH:  no palpable lymphadenopathy in the cervical, axillary or inguinal LUNGS: clear to auscultation and percussion with normal breathing effort HEART: regular rate & rhythm and no murmurs and no lower extremity edema ABDOMEN:abdomen soft, non-tender and normal bowel sounds MUSCULOSKELETAL:no cyanosis of digits and no clubbing  NEURO: alert & oriented x 3 with fluent speech, no focal motor/sensory deficits EXTREMITIES: No lower extremity edema  LABORATORY DATA:  I have reviewed the data as listed   Chemistry      Component Value Date/Time   NA 142 05/01/2016 1026   NA 140 04/02/2016 0940   K 4.3 05/01/2016 1026   K  3.9 04/02/2016 0940   CL 103 04/02/2016 0940   CO2 27 05/01/2016 1026   CO2 26 04/02/2016 0940   BUN 28.0* 05/01/2016 1026   BUN 25 04/02/2016 0940   CREATININE 1.5* 05/01/2016 1026   CREATININE 1.40* 04/02/2016 0940   CREATININE 1.50* 03/18/2016 1116      Component Value Date/Time   CALCIUM 9.5 05/01/2016 1026   CALCIUM 9.7 04/02/2016 0940   ALKPHOS 179* 05/01/2016 1026   ALKPHOS 115 04/02/2016 0940   AST 77* 05/01/2016 1026   AST 43* 04/02/2016 0940   ALT 166* 05/01/2016 1026   ALT 70* 04/02/2016 0940   BILITOT 0.30 05/01/2016 1026   BILITOT 0.4 04/02/2016 0940       Lab Results  Component Value Date   WBC 24.6* 05/15/2016   HGB 9.6* 05/15/2016   HCT 29.0* 05/15/2016   MCV 89.8 05/15/2016   PLT 158 05/15/2016   NEUTROABS 20.4* 05/15/2016     ASSESSMENT & PLAN:  Breast  cancer of upper-inner quadrant of left female breast ([place]) Left lumpectomy 03/08/2016: Invasive ductal carcinoma, grade 2, 6.3 cm, with high-grade DCIS, margins negative, 0/4 lymph nodes negative, ER 90%, via 10%, HER-2 negative ratio 0.97, Ki-67 30%, T3 N0 stage IIB  Oncotype DX counseling: Patient has a score of 81 which translates into 25% 10 year risk of distant recurrence with tamoxifen alone. Based on this, I strongly recommend systemic chemotherapy.  Treatment plan: 1. Adjuvant chemotherapy with dose dense Adriamycin and Cytoxan 4 followed by Abraxane weekly 12 2. Adjuvant radiation therapy followed by 3. Adjuvant antiestrogen therapy ---------------------------------------------------------------------------------------------- Current treatment: Cycle 3 day 1 of dose dense Adriamycin and Cytoxan (started at a slightly lower dose given her age) Echocardiogram revealed EF of 60-65% on 04/16/2016  Chemotherapy toxicities: 1. Fatigue due to chemotherapy  2. Elevated ALT: Not due to chemotherapy, we will continue to watch and monitor this 3. Chemotherapy-induced anemia: Hemoglobin 9.6  Denied any nausea/ vomiting  Return to clinic in 2 weeks for cycle 4    No orders of the defined types were placed in this encounter.   The patient has a good understanding of the overall plan. she agrees with it. she will call with any problems that may develop before the next visit here.   Rulon Eisenmenger, MD 05/15/2016

## 2016-05-15 NOTE — Progress Notes (Signed)
CMET reviewed by [name] per MD request and okay to proceed with treatment.

## 2016-05-15 NOTE — Telephone Encounter (Signed)
appt made and avs printed °

## 2016-05-15 NOTE — Patient Instructions (Signed)
[facility] Discharge Instructions for Patients Receiving Chemotherapy  Today you received the following chemotherapy agents adriamycin/cytoxan  To help prevent nausea and vomiting after your treatment, we encourage you to take your nausea medication as directed  If you develop nausea and vomiting that is not controlled by your nausea medication, call the clinic.   BELOW ARE SYMPTOMS THAT SHOULD BE REPORTED IMMEDIATELY:  *FEVER GREATER THAN 100.5 F  *CHILLS WITH OR WITHOUT FEVER  NAUSEA AND VOMITING THAT IS NOT CONTROLLED WITH YOUR NAUSEA MEDICATION  *UNUSUAL SHORTNESS OF BREATH  *UNUSUAL BRUISING OR BLEEDING  TENDERNESS IN MOUTH AND THROAT WITH OR WITHOUT PRESENCE OF ULCERS  *URINARY PROBLEMS  *BOWEL PROBLEMS  UNUSUAL RASH Items with * indicate a potential emergency and should be followed up as soon as possible.  Feel free to call the clinic you have any questions or concerns. The clinic phone number is [phone].  

## 2016-05-16 ENCOUNTER — Telehealth: Payer: Self-pay

## 2016-05-16 NOTE — Telephone Encounter (Signed)
Received VM from pt stating she was having fluid retention in bilateral legs and wrist.  Pt completed cycle 3 of A/C 05/15/16.  Pt reports she has been elevating her feet and wearing her compression stockings but wanted to see if there was anything else she should be doing.  Pt states this has happened before but not necessarily in conjunction with chemotherapy.  Discussed case with Dr. Jana Hakim who recommended pt to take extra dose of HCTZ.  Pt currently takes 81m daily.  This information relayed to pt and pt in agreement.  Pt also instructed to seek emergency help should serious side effects occur over the weekend.  Pt without further questions or concerns at time of call and stated she would contact [place] any in the future should they arise.

## 2016-05-26 ENCOUNTER — Encounter (HOSPITAL_COMMUNITY): Payer: Self-pay

## 2016-05-26 ENCOUNTER — Other Ambulatory Visit: Payer: Self-pay | Admitting: Internal Medicine

## 2016-05-26 ENCOUNTER — Ambulatory Visit (HOSPITAL_COMMUNITY)
Admission: RE | Admit: 2016-05-26 | Discharge: 2016-05-26 | Disposition: A | Discharge status: Home / Self Care | Payer: PPO | Source: Ambulatory Visit | Attending: Cardiology | Admitting: Cardiology

## 2016-05-26 ENCOUNTER — Ambulatory Visit (HOSPITAL_BASED_OUTPATIENT_CLINIC_OR_DEPARTMENT_OTHER)
Admission: RE | Admit: 2016-05-26 | Discharge: 2016-05-26 | Disposition: A | Discharge status: Home / Self Care | Payer: PPO | Source: Ambulatory Visit | Attending: Cardiology | Admitting: Cardiology

## 2016-05-26 VITALS — BP 118/70 | HR 96 | Ht 64.0 in | Wt 155.0 lb

## 2016-05-26 DIAGNOSIS — C50212 Malignant neoplasm of upper-inner quadrant of left female breast: Secondary | ICD-10-CM | POA: Insufficient documentation

## 2016-05-26 DIAGNOSIS — Z09 Encounter for follow-up examination after completed treatment for conditions other than malignant neoplasm: Secondary | ICD-10-CM | POA: Insufficient documentation

## 2016-05-26 LAB — ECHOCARDIOGRAM COMPLETE
E decel time: 211 msec
EERAT: 16.1
FS: 29 % (ref 28–44)
IV/PV OW: 1.03
LA diam end sys: 27 mm
LA diam index: 1.5 cm/m2
LA vol A4C: 43.9 ml
LASIZE: 27 mm
LDCA: 2.27 cm2
LV PW d: 9.42 mm — AB (ref 0.6–1.1)
LVEEAVG: 16.1
LVEEMED: 16.1
LVELAT: 4.79 cm/s
LVOTD: 17 mm
MV Dec: 211
MV Peak grad: 2 mmHg
MVPKAVEL: 113 m/s
MVPKEVEL: 77.1 m/s
RV TAPSE: 20 mm
TDI e' lateral: 4.79
TDI e' medial: 4.79

## 2016-05-26 NOTE — Patient Instructions (Signed)
Follow up 3 months with echocardiogram.  Do the following things EVERYDAY: 1) Weigh yourself in the morning before breakfast. Write it down and keep it in a log. 2) Take your medicines as prescribed 3) Eat low salt foods-Limit salt (sodium) to 2000 mg per day.  4) Stay as active as you can everyday 5) Limit all fluids for the day to less than 2 liters

## 2016-05-26 NOTE — Progress Notes (Signed)
Advanced Heart Failure Medication Review by a Pharmacist  Does the patient  feel that his/her medications are working for him/her?  yes  Has the patient been experiencing any side effects to the medications prescribed?  no  Does the patient measure his/her own blood pressure or blood glucose at home?  yes   Does the patient have any problems obtaining medications due to transportation or finances?   no  Understanding of regimen: good Understanding of indications: good Potential of compliance: good Patient understands to avoid NSAIDs. Patient understands to avoid decongestants.  Issues to address at subsequent visits: None   Pharmacist comments:  [name] is a pleasant 80 yo F presenting with her husband and without a medication list but with good recall of her regimen. She reports good compliance with her regimen and did not have any specific medication-related questions or concerns for me at this time.   Kayla Baker. Velva Harman, PharmD, BCPS, CPP Clinical Pharmacist Pager: [phone] Phone: [phone] 05/26/2016 11:37 AM      Time with patient: 10 minutes Preparation and documentation time: 2 minutes Total time: 12 minutes

## 2016-05-26 NOTE — Progress Notes (Signed)
Echocardiogram 2D Echocardiogram has been performed.  Kayla Baker 05/26/2016, 11:03 AM

## 2016-05-26 NOTE — Progress Notes (Signed)
Patient ID: Kayla Baker, female   DOB: 27-Aug-1936, 80 y.o.   MRN: [id number]    CARDIO-ONCOLOGY CLINIC CONSULT NOTE  Referring Physician: Lindi Adie   HPI:  Kayla Baker is a 80 y/o woman with HTN, HL, GERD and left breast cancer diagnosed 3/17. ER + PR- HER-2 - referred by [name] for enrollment into the [facility].    DIAGNOSIS: Breast cancer of upper-inner quadrant of left female breast [name])  Staging form: Breast, AJCC 7th Edition  Clinical stage from 03/05/2016: Stage IIA (T2, N0, M0) - Unsigned  Pathologic: Stage IIB (T3, N0, cM0) - Signed by [name], MD on 03/27/2016   SUMMARY OF ONCOLOGIC HISTORY:   Breast cancer of upper-inner quadrant of left female breast ([name])   02/26/2016 Initial Diagnosis Left breast biopsy 11:00 position: invasive ductal carcinoma with DCIS, ER 90%, PR 10%, HER-2 negative, Ki-67 30%, grade 2, 2.2 cm palpable lesion T2 N0 stage II a clinical stage   03/18/2016 Surgery Left lumpectomy: Invasive ductal carcinoma, grade 2, 6.3 cm, with high-grade DCIS, margins negative, 0/4 lymph nodes negative, ER 90%, via 10%, HER-2 negative ratio 0.97, Ki-67 30%, T3 N0 stage IIB   04/04/2016 Oncotype testing Oncotype DX recurrence score 37, 25% 10 year distant risk of recurrence         Planned treatment:  1. Adjuvant chemotherapy with dose dense Adriamycin and Cytoxan 4 followed by Abraxane weekly 12 2. Adjuvant radiation therapy followed by 3. Adjuvant antiestrogen therapy  She has completed 3/4 Adriamycin and Cytoxan and is to complete 4/4 on 05/29/16.  Scheduled to start Abraxane weekly x 12 on or around July 13th. Has felt some fatigue and dysgeusia related to her chemo, appetite has been OK. Denies DOE on flat ground, stairs or hills. No orthopnea/PND. No lightheadedness or dizziness. Did have some edema in her legs after her last infusion, but improved on an increased dose of HCTZ.    Echo 04/16/16 EF 60-65% Grade I DD. Lateral s' 10.8  cm/s GLS -15.9% (underestimated due to poor tracking) Echo today reviewed personally: LVEF 60-65%, normal RV, Lateral s' 11.1, GLS -16.3% (Still appears to be underestimated with poor tracking)   Past Medical History  Diagnosis Date  . Hyperlipidemia     takes Atorvastatin daily  . Gout     takes Allopurinol daily  . Allergy   . Vitamin D deficiency   . Anemia   . Arthritis   . PONV (postoperative nausea and vomiting)   . Hypertension     takes Enalapril and HCTZ daily  . Peripheral neuropathy (HCC)     takes Gabapentin and Elavil daily  . GERD (gastroesophageal reflux disease)   . History of colon polyps     benign  . Diverticulosis   . History of shingles   . Diet-controlled type 2 diabetes mellitus ([place])     diet and exercsise,Has never been on meds  . Breast cancer of upper-inner quadrant of left female breast ([name]) 02/29/2016    skin- 2016- squamous- on right upper arm    Current Outpatient Prescriptions  Medication Sig Dispense Refill  . allopurinol (ZYLOPRIM) 300 MG tablet TAKE 1 TABLET BY MOUTH EVERY DAY 90 tablet 1  . amitriptyline (ELAVIL) 25 MG tablet Take 1 to 2 tablets at bedtime for neuritis 180 tablet 1  . aspirin 81 MG tablet Take 81 mg by mouth daily.    Marland Kitchen atorvastatin (LIPITOR) 40 MG tablet TAKE 1 TABLET BY MOUTH EVERY DAY 90 tablet 1  .  dexamethasone (DECADRON) 4 MG tablet Take 1 tablet (4 mg total) by mouth daily. 1 tab daily X 3 days 30 tablet 1  . enalapril (VASOTEC) 20 MG tablet TAKE 1 TABLET BY MOUTH DAILY 90 tablet 1  . FREESTYLE LITE test strip USE ONE STRIP TO CHECK GLUCOSE ONCE DAILY. 100 each 0  . gabapentin (NEURONTIN) 100 MG capsule Take 1 to 3 capsules at bedtime for neuritis 90 capsule 5  . hydrochlorothiazide (HYDRODIURIL) 25 MG tablet TAKE 1 TABLET BY MOUTH DAILY 90 tablet 2  . lidocaine-prilocaine (EMLA) cream Apply to affected area once 30 g 3  . LORazepam (ATIVAN) 0.5 MG tablet Take 0.5 mg by mouth 3 times/day as needed-between meals &  bedtime for anxiety or sleep.    Marland Kitchen ondansetron (ZOFRAN) 8 MG tablet Take 1 tablet (8 mg total) by mouth 2 (two) times daily as needed. Start on the third day after chemotherapy. 30 tablet 1  . prochlorperazine (COMPAZINE) 10 MG tablet Take 1 tablet (10 mg total) by mouth every 6 (six) hours as needed (Nausea or vomiting). 30 tablet 1  . ranitidine (ZANTAC) 300 MG tablet Take twice a day as needed for Heartburn & Acid Indigestion 60 tablet 3  . vitamin C (ASCORBIC ACID) 500 MG tablet Take 500 mg by mouth daily. Takes 2 tablets daily    . chlorhexidine (HIBICLENS) 4 % external liquid Apply 15 mLs (1 application total) topically once. (Patient not taking: Reported on 04/16/2016) 120 mL 0  . lactated ringers infusion Inject 125 mLs into the vein continuous. (Patient not taking: Reported on 04/16/2016) 250 mL 0   No current facility-administered medications for this encounter.    Allergies  Allergen Reactions  . Keflex [Cephalexin] Swelling    Tongue and throat swells  . Meloxicam Swelling  . Prednisone Swelling    Can Not take High doses  . Premarin [Estrogens Conjugated] Hives  . Trovan [Alatrofloxacin] Other (See Comments)    Unknown reaction      Social History   Social History  . Marital Status: Married    Spouse Name: N/A  . Number of Children: N/A  . Years of Education: N/A   Occupational History  . Not on file.   Social History Main Topics  . Smoking status: Never Smoker   . Smokeless tobacco: Never Used  . Alcohol Use: No  . Drug Use: No  . Sexual Activity: Not on file   Other Topics Concern  . Not on file   Social History Narrative      Family History  Problem Relation Age of Onset  . Colon cancer Neg Hx   . Stomach cancer Neg Hx   . Stroke Mother 49  . Other Mother     history of hysterectomy after last childbirth  . Diabetes Father   . Alzheimer's disease Father   . Hypertension Sister   . Lung cancer Sister 21    smoker  . Other Sister     history  of hysterectomy for fibroids  . Breast cancer Sister 4  . Esophageal cancer Maternal Aunt 74    not a smoker  . Heart attack Maternal Uncle 65  . Cirrhosis Sister   . Lung cancer Other     nephew dx. 69s; +smoker  . Epilepsy Other   . Other Other 12    great niece dx. benign ganglioglioma brain tumor; [facility]  . Epilepsy Other     no seizures in 2 years  .  Parkinson's disease Maternal Aunt   . Stroke Maternal Grandmother   . Heart Problems Maternal Grandmother   . Pancreatic cancer Cousin 93    paternal 1st cousin    Filed Vitals:   05/26/16 1118  BP: 118/70  Pulse: 96  Height: 5' 4"  (1.626 m)  Weight: 155 lb (70.308 kg)  SpO2: 95%    PHYSICAL EXAM: General:  Well appearing. No respiratory difficulty HEENT: normal Neck: supple. no JVD.+ port Carotids 2+ bilat; no bruits. No lymphadenopathy or thyromegaly noted Cor: PMI nondisplaced. RRR, No M/G/R Lungs: CTAB, normal effort Abdomen: soft, NT, ND, no HSM. No bruits or masses. +BS  Extremities: no cyanosis, clubbing, rash. Trace ankle edema.  Several small 0.5 cm sores,  Neuro: alert & oriented x 3, cranial nerves grossly intact. moves all 4 extremities w/o difficulty. Affect pleasant.   ASSESSMENT & PLAN: 1. Left breast CA  --s/p Left lumpectomy 03/08/2016: Invasive ductal carcinoma, grade 2, 6.3 cm, with high-grade DCIS, margins negative, 0/4 lymph nodes negative, ER 90%, via 10%, HER-2 negative ratio 0.97, Ki-67 30%, T3 N0 stage II -- Plan of therapy:     1. Adjuvant chemotherapy with dose dense Adriamycin and Cytoxan 4 followed by Abraxane weekly 12    2. Adjuvant radiation therapy    3. Adjuvant antiestrogen therapy -Explained incidence of Adriamycin cardiotoxicity and role of Cardio-oncology clinic at length. Echo images reviewed personally. All parameters stable. Reviewed signs and symptoms of HF to look for.  2. HTN  -- Stable on current regimen. Followed by PCP  She is s/p 3/4 rounds of Adriamycin  Cytoxan. Echo parameters stable. With her finishing adriamycin this week, will do 3 month echo to continue to look for toxicity.   Shirley Friar, PA-C 11:36 AM  Patient seen with PA, agree with the above note.  Echo today is stable compared to prior, normal EF.  Strain did not appear to track particularly well.  She will continue her chemotherapy, will get last dose of Adriamycin this week.  In 3 months, I will obtain a post-Adriamcyin echo with followup.   Loralie Champagne 05/27/2016

## 2016-05-29 ENCOUNTER — Other Ambulatory Visit (HOSPITAL_BASED_OUTPATIENT_CLINIC_OR_DEPARTMENT_OTHER): Payer: PPO

## 2016-05-29 ENCOUNTER — Encounter: Payer: Self-pay | Admitting: Hematology and Oncology

## 2016-05-29 ENCOUNTER — Telehealth: Payer: Self-pay | Admitting: Hematology and Oncology

## 2016-05-29 ENCOUNTER — Other Ambulatory Visit: Payer: Self-pay | Admitting: Hematology and Oncology

## 2016-05-29 ENCOUNTER — Ambulatory Visit (HOSPITAL_BASED_OUTPATIENT_CLINIC_OR_DEPARTMENT_OTHER): Payer: PPO

## 2016-05-29 ENCOUNTER — Ambulatory Visit (HOSPITAL_BASED_OUTPATIENT_CLINIC_OR_DEPARTMENT_OTHER): Payer: PPO | Admitting: Hematology and Oncology

## 2016-05-29 VITALS — BP 110/69 | HR 116 | Temp 98.3°F | Resp 18 | Ht 64.0 in | Wt 153.9 lb

## 2016-05-29 VITALS — HR 106

## 2016-05-29 DIAGNOSIS — C50212 Malignant neoplasm of upper-inner quadrant of left female breast: Secondary | ICD-10-CM

## 2016-05-29 DIAGNOSIS — Z170 Estrogen receptor positive status [ER+]: Secondary | ICD-10-CM | POA: Diagnosis not present

## 2016-05-29 DIAGNOSIS — Z5189 Encounter for other specified aftercare: Secondary | ICD-10-CM | POA: Diagnosis not present

## 2016-05-29 DIAGNOSIS — Z5111 Encounter for antineoplastic chemotherapy: Secondary | ICD-10-CM | POA: Diagnosis not present

## 2016-05-29 DIAGNOSIS — D6481 Anemia due to antineoplastic chemotherapy: Secondary | ICD-10-CM

## 2016-05-29 LAB — COMPREHENSIVE METABOLIC PANEL
ALT: 66 U/L — ABNORMAL HIGH (ref 0–55)
AST: 40 U/L — AB (ref 5–34)
Albumin: 3.6 g/dL (ref 3.5–5.0)
Alkaline Phosphatase: 215 U/L — ABNORMAL HIGH (ref 40–150)
Anion Gap: 10 mEq/L (ref 3–11)
BUN: 23.2 mg/dL (ref 7.0–26.0)
CALCIUM: 10.1 mg/dL (ref 8.4–10.4)
CHLORIDE: 107 meq/L (ref 98–109)
CO2: 26 meq/L (ref 22–29)
Creatinine: 1.5 mg/dL — ABNORMAL HIGH (ref 0.6–1.1)
EGFR: 33 mL/min/{1.73_m2} — ABNORMAL LOW (ref >90)
Glucose: 102 mg/dl (ref 70–140)
POTASSIUM: 4.6 meq/L (ref 3.5–5.1)
Sodium: 143 mEq/L (ref 136–145)
Total Bilirubin: <0.3 mg/dL (ref 0.20–1.20)
Total Protein: 6.8 g/dL (ref 6.4–8.3)

## 2016-05-29 LAB — CBC WITH DIFFERENTIAL/PLATELET
BASO%: 0.6 % (ref 0.0–2.0)
BASOS ABS: 0.1 10*3/uL (ref 0.0–0.1)
EOS%: 0.1 % (ref 0.0–7.0)
Eosinophils Absolute: 0 10*3/uL (ref 0.0–0.5)
HEMATOCRIT: 28.8 % — AB (ref 34.8–46.6)
HGB: 9.5 g/dL — ABNORMAL LOW (ref 11.6–15.9)
LYMPH#: 1.2 10*3/uL (ref 0.9–3.3)
LYMPH%: 6.1 % — AB (ref 14.0–49.7)
MCH: 29.5 pg (ref 25.1–34.0)
MCHC: 32.9 g/dL (ref 31.5–36.0)
MCV: 89.6 fL (ref 79.5–101.0)
MONO#: 1.7 10*3/uL — ABNORMAL HIGH (ref 0.1–0.9)
MONO%: 8.4 % (ref 0.0–14.0)
NEUT#: 16.8 10*3/uL — ABNORMAL HIGH (ref 1.5–6.5)
NEUT%: 84.8 % — AB (ref 38.4–76.8)
Platelets: 142 10*3/uL — ABNORMAL LOW (ref 145–400)
RBC: 3.21 10*6/uL — AB (ref 3.70–5.45)
RDW: 15.3 % — ABNORMAL HIGH (ref 11.2–14.5)
WBC: 19.8 10*3/uL — ABNORMAL HIGH (ref 3.9–10.3)

## 2016-05-29 MED ORDER — SODIUM CHLORIDE 0.9 % IV SOLN
Freq: Once | INTRAVENOUS | Status: AC
  Administered 2016-05-29: 12:00:00 via INTRAVENOUS
  Filled 2016-05-29: qty 5

## 2016-05-29 MED ORDER — HEPARIN SOD (PORK) LOCK FLUSH 100 UNIT/ML IV SOLN
500.0000 [IU] | Freq: Once | INTRAVENOUS | Status: AC | PRN
  Administered 2016-05-29: 500 [IU]
  Filled 2016-05-29: qty 5

## 2016-05-29 MED ORDER — DOXORUBICIN HCL CHEMO IV INJECTION 2 MG/ML
50.0000 mg/m2 | Freq: Once | INTRAVENOUS | Status: AC
  Administered 2016-05-29: 88 mg via INTRAVENOUS
  Filled 2016-05-29: qty 44

## 2016-05-29 MED ORDER — FUROSEMIDE 20 MG PO TABS: 20.0000 mg | ORAL_TABLET | Freq: Every day | ORAL | Status: DC

## 2016-05-29 MED ORDER — SODIUM CHLORIDE 0.9 % IV SOLN
500.0000 mg/m2 | Freq: Once | INTRAVENOUS | Status: AC
  Administered 2016-05-29: 880 mg via INTRAVENOUS
  Filled 2016-05-29: qty 44

## 2016-05-29 MED ORDER — PALONOSETRON HCL INJECTION 0.25 MG/5ML
0.2500 mg | Freq: Once | INTRAVENOUS | Status: AC
  Administered 2016-05-29: 0.25 mg via INTRAVENOUS

## 2016-05-29 MED ORDER — SODIUM CHLORIDE 0.9% FLUSH
10.0000 mL | INTRAVENOUS | Status: DC | PRN
  Administered 2016-05-29: 10 mL
  Filled 2016-05-29: qty 10

## 2016-05-29 MED ORDER — PEGFILGRASTIM 6 MG/0.6ML ~~LOC~~ PSKT
6.0000 mg | PREFILLED_SYRINGE | Freq: Once | SUBCUTANEOUS | Status: AC
  Administered 2016-05-29: 6 mg via SUBCUTANEOUS
  Filled 2016-05-29: qty 0.6

## 2016-05-29 MED ORDER — PALONOSETRON HCL INJECTION 0.25 MG/5ML
INTRAVENOUS | Status: AC
  Filled 2016-05-29: qty 5

## 2016-05-29 MED ORDER — SODIUM CHLORIDE 0.9 % IV SOLN
Freq: Once | INTRAVENOUS | Status: AC
Start: 2016-05-29 — End: 2016-05-29
  Administered 2016-05-29: 12:00:00 via INTRAVENOUS

## 2016-05-29 NOTE — Assessment & Plan Note (Signed)
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 Based on this, I strongly recommend systemic chemotherapy.  Treatment plan: 1. Adjuvant chemotherapy with dose dense Adriamycin and Cytoxan 4 followed by Abraxane weekly 12 2. Adjuvant radiation therapy followed by 3. Adjuvant antiestrogen therapy ---------------------------------------------------------------------------------------------- Current treatment: Cycle 4 day 1 of dose dense Adriamycin and Cytoxan (started at a slightly lower dose given her age) Echocardiogram revealed EF of 60-65% on 04/16/2016  Chemotherapy toxicities: 1. Fatigue due to chemotherapy  2. Elevated ALT: Not due to chemotherapy, we will continue to watch and monitor this 3. Chemotherapy-induced anemia: Hemoglobin 9.6  Denied any nausea/ vomiting  Return to clinic in 2 weeks for cycle 1/12 Abraxane  

## 2016-05-29 NOTE — Telephone Encounter (Signed)
appt made and avs printed °

## 2016-05-29 NOTE — Progress Notes (Signed)
Ok to treat patient with increased heart rate today VO [name] Beverlee Nims Burleson RN

## 2016-05-29 NOTE — Progress Notes (Signed)
Patient Care Team: [name], MD as PCP - General Macarthur Critchley, OD as Referring Physician (Optometry) [name], MD as Consulting Physician (Dermatology) Latanya Maudlin, MD as Consulting Physician (Orthopedic Surgery) Inda Castle, MD as Consulting Physician (Gastroenterology)  DIAGNOSIS: Breast cancer of upper-inner quadrant of left female breast [facility])   Staging form: Breast, AJCC 7th Edition     Clinical stage from 03/05/2016: Stage IIA (T2, N0, M0) - Unsigned     Pathologic: Stage IIB (T3, N0, cM0) - Signed by [name], MD on 03/27/2016   SUMMARY OF ONCOLOGIC HISTORY:   Breast cancer of upper-inner quadrant of left female breast ([place])   02/26/2016 Initial Diagnosis Left breast biopsy 11:00 position: invasive ductal carcinoma with DCIS, ER 90%, PR 10%, HER-2 negative, Ki-67 30%, grade 2, 2.2 cm palpable lesion T2 N0 stage II a clinical stage   03/18/2016 Surgery Left lumpectomy: Invasive ductal carcinoma, grade 2, 6.3 cm, with high-grade DCIS, margins negative, 0/4 lymph nodes negative, ER 90%, via 10%, HER-2 negative ratio 0.97, Ki-67 30%, T3 N0 stage IIB   03/25/2016 Procedure Genetic testing is negative for pathogenic mutations within any of the 20 Genes on the breast/ovarian cancer panel   04/04/2016 Oncotype testing Oncotype DX recurrence score 37, 25% 10 year distant risk of recurrence   04/17/2016 -  Chemotherapy Adjuvant chemotherapy with dose dense Adriamycin and Cytoxan followed by Abraxane weekly 12    CHIEF COMPLIANT: Cycle 4 of dose dense Adriamycin and Cytoxan  INTERVAL HISTORY: Kayla Baker is a 80 year old above-mentioned history of left breast cancer currently on adjuvant chemotherapy. Today is cycle 4 of dose dense Adriamycin and Cytoxan. She appears to be tolerating chemotherapy fairly well except for fatigue and some swelling of the legs. She appears to be very bothered by the leg swelling. It lasted for 3-4 days. She thinks it may be connected to increase salt  intake.  REVIEW OF SYSTEMS:   Constitutional: Denies fevers, chills or abnormal weight loss, complains of fatigue  Eyes: Denies blurriness of vision Ears, nose, mouth, throat, and face: Denies mucositis or sore throat Respiratory: Denies cough, dyspnea or wheezes Cardiovascular: Denies palpitation, chest discomfort Gastrointestinal:  Denies nausea, heartburn or change in bowel habits Skin: Denies abnormal skin rashes Lymphatics: Denies new lymphadenopathy or easy bruising Neurological:Denies numbness, tingling or new weaknesses Behavioral/Psych: Mood is stable, no new changes  Extremities: No lower extremity edema although previously she had lower extremity edema  Breast:  denies any pain or lumps or nodules in either breasts All other systems were reviewed with the patient and are negative.  I have reviewed the past medical history, past surgical history, social history and family history with the patient and they are unchanged from previous note.  ALLERGIES:  is allergic to keflex; meloxicam; prednisone; premarin; and trovan.  MEDICATIONS:  Current Outpatient Prescriptions  Medication Sig Dispense Refill  . allopurinol (ZYLOPRIM) 300 MG tablet TAKE 1 TABLET BY MOUTH EVERY DAY 90 tablet 1  . amitriptyline (ELAVIL) 25 MG tablet Take 1 to 2 tablets at bedtime for neuritis 180 tablet 1  . aspirin 81 MG tablet Take 81 mg by mouth daily.    Marland Kitchen atorvastatin (LIPITOR) 40 MG tablet TAKE 1 TABLET BY MOUTH EVERY DAY 90 tablet 1  . chlorhexidine (HIBICLENS) 4 % external liquid Apply 15 mLs (1 application total) topically once. (Patient not taking: Reported on 04/16/2016) 120 mL 0  . dexamethasone (DECADRON) 4 MG tablet Take 1 tablet (4 mg total) by mouth  daily. 1 tab daily X 3 days 30 tablet 1  . enalapril (VASOTEC) 20 MG tablet TAKE 1 TABLET BY MOUTH DAILY 90 tablet 1  . FREESTYLE LITE test strip USE ONE STRIP TO CHECK GLUCOSE ONCE DAILY. 100 each 0  . gabapentin (NEURONTIN) 100 MG capsule Take  1 to 3 capsules at bedtime for neuritis 90 capsule 5  . hydrochlorothiazide (HYDRODIURIL) 25 MG tablet TAKE 1 TABLET BY MOUTH DAILY 90 tablet 2  . lactated ringers infusion Inject 125 mLs into the vein continuous. (Patient not taking: Reported on 04/16/2016) 250 mL 0  . lidocaine-prilocaine (EMLA) cream Apply to affected area once 30 g 3  . LORazepam (ATIVAN) 0.5 MG tablet Take 0.5 mg by mouth 3 times/day as needed-between meals & bedtime for anxiety or sleep.    Marland Kitchen ondansetron (ZOFRAN) 8 MG tablet Take 1 tablet (8 mg total) by mouth 2 (two) times daily as needed. Start on the third day after chemotherapy. 30 tablet 1  . prochlorperazine (COMPAZINE) 10 MG tablet Take 1 tablet (10 mg total) by mouth every 6 (six) hours as needed (Nausea or vomiting). 30 tablet 1  . ranitidine (ZANTAC) 300 MG tablet Take twice a day as needed for Heartburn & Acid Indigestion 60 tablet 3  . vitamin C (ASCORBIC ACID) 500 MG tablet Take 500 mg by mouth daily. Takes 2 tablets daily     No current facility-administered medications for this visit.    PHYSICAL EXAMINATION: ECOG PERFORMANCE STATUS: 1 - Symptomatic but completely ambulatory  Filed Vitals:   05/29/16 1053  BP: 110/69  Pulse: 116  Temp: 98.3 F (36.8 C)  Resp: 18   Filed Weights   05/29/16 1053  Weight: 153 lb 14.4 oz (69.809 kg)    GENERAL:alert, no distress and comfortable SKIN: skin color, texture, turgor are normal, no rashes or significant lesions EYES: normal, Conjunctiva are pink and non-injected, sclera clear OROPHARYNX:no exudate, no erythema and lips, buccal mucosa, and tongue normal  NECK: supple, thyroid normal size, non-tender, without nodularity LYMPH:  no palpable lymphadenopathy in the cervical, axillary or inguinal LUNGS: clear to auscultation and percussion with normal breathing effort HEART: regular rate & rhythm and no murmurs and no lower extremity edema ABDOMEN:abdomen soft, non-tender and normal bowel  sounds MUSCULOSKELETAL:no cyanosis of digits and no clubbing  NEURO: alert & oriented x 3 with fluent speech, no focal motor/sensory deficits EXTREMITIES: No lower extremity edema  LABORATORY DATA:  I have reviewed the data as listed   Chemistry      Component Value Date/Time   NA 142 05/15/2016 1038   NA 140 04/02/2016 0940   K 4.6 05/15/2016 1038   K 3.9 04/02/2016 0940   CL 103 04/02/2016 0940   CO2 28 05/15/2016 1038   CO2 26 04/02/2016 0940   BUN 26.2* 05/15/2016 1038   BUN 25 04/02/2016 0940   CREATININE 1.4* 05/15/2016 1038   CREATININE 1.40* 04/02/2016 0940   CREATININE 1.50* 03/18/2016 1116      Component Value Date/Time   CALCIUM 10.0 05/15/2016 1038   CALCIUM 9.7 04/02/2016 0940   ALKPHOS 192* 05/15/2016 1038   ALKPHOS 115 04/02/2016 0940   AST 33 05/15/2016 1038   AST 43* 04/02/2016 0940   ALT 64* 05/15/2016 1038   ALT 70* 04/02/2016 0940   BILITOT <0.30 05/15/2016 1038   BILITOT 0.4 04/02/2016 0940       Lab Results  Component Value Date   WBC 19.8* 05/29/2016   HGB 9.5* 05/29/2016  HCT 28.8* 05/29/2016   MCV 89.6 05/29/2016   PLT 142* 05/29/2016   NEUTROABS 16.8* 05/29/2016     ASSESSMENT & PLAN:  Breast cancer of upper-inner quadrant of left female breast ([place]) Left lumpectomy 03/08/2016: Invasive ductal carcinoma, grade 2, 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 Based on this, I strongly recommend systemic chemotherapy.  Treatment plan: 1. Adjuvant chemotherapy with dose dense Adriamycin and Cytoxan 4 followed by Abraxane weekly 12 2. Adjuvant radiation therapy followed by 3. Adjuvant antiestrogen therapy ---------------------------------------------------------------------------------------------- Current  treatment: Cycle 4 day 1 of dose dense Adriamycin and Cytoxan (started at a slightly lower dose given her age) Echocardiogram revealed EF of 60-65% on 04/16/2016  Chemotherapy toxicities: 1. Fatigue due to chemotherapy  2. Elevated ALT: Not due to chemotherapy, we will continue to watch and monitor this 3. Chemotherapy-induced anemia: Hemoglobin 9.6 4. Lower extremity edema: I prescribed her Lasix when necessary.   Denied any nausea/ vomiting  Return to clinic in 2 weeks for cycle 1/12 Abraxane    No orders of the defined types were placed in this encounter.   The patient has a good understanding of the overall plan. she agrees with it. she will call with any problems that may develop before the next visit here.   Rulon Eisenmenger, MD 05/29/2016

## 2016-05-29 NOTE — Patient Instructions (Signed)
[facility] Discharge Instructions for Patients Receiving Chemotherapy  Today you received the following chemotherapy agents adriamycin/cytoxan  To help prevent nausea and vomiting after your treatment, we encourage you to take your nausea medication as directed  If you develop nausea and vomiting that is not controlled by your nausea medication, call the clinic.   BELOW ARE SYMPTOMS THAT SHOULD BE REPORTED IMMEDIATELY:  *FEVER GREATER THAN 100.5 F  *CHILLS WITH OR WITHOUT FEVER  NAUSEA AND VOMITING THAT IS NOT CONTROLLED WITH YOUR NAUSEA MEDICATION  *UNUSUAL SHORTNESS OF BREATH  *UNUSUAL BRUISING OR BLEEDING  TENDERNESS IN MOUTH AND THROAT WITH OR WITHOUT PRESENCE OF ULCERS  *URINARY PROBLEMS  *BOWEL PROBLEMS  UNUSUAL RASH Items with * indicate a potential emergency and should be followed up as soon as possible.  Feel free to call the clinic you have any questions or concerns. The clinic phone number is [phone].  

## 2016-05-30 ENCOUNTER — Other Ambulatory Visit: Payer: Self-pay | Admitting: Hematology and Oncology

## 2016-05-30 DIAGNOSIS — C50212 Malignant neoplasm of upper-inner quadrant of left female breast: Secondary | ICD-10-CM

## 2016-06-10 ENCOUNTER — Other Ambulatory Visit: Payer: Self-pay | Admitting: Internal Medicine

## 2016-06-11 NOTE — Assessment & Plan Note (Signed)
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 Based on this, I strongly recommend systemic chemotherapy.  Treatment plan: 1. Adjuvant chemotherapy with dose dense Adriamycin and Cytoxan 4 followed by Abraxane weekly 12 2. Adjuvant radiation therapy followed by 3. Adjuvant antiestrogen therapy ---------------------------------------------------------------------------------------------- Current treatment: completed Cycle 4 to cycle of dose dense Adriamycin and Cytoxan (started at a slightly lower dose given her age), Today is Abrance cycle 1/12 Echocardiogram revealed EF of 60-65% on 04/16/2016  Chemotherapy toxicities: 1. Fatigue due to chemotherapy  2. Elevated ALT: Not due to chemotherapy, we will continue to watch and monitor this 3. Chemotherapy-induced anemia: Hemoglobin 9.6 4. Lower extremity edema: I prescribed her Lasix when necessary  RTC in 2 weeks

## 2016-06-12 ENCOUNTER — Other Ambulatory Visit (HOSPITAL_BASED_OUTPATIENT_CLINIC_OR_DEPARTMENT_OTHER): Payer: PPO

## 2016-06-12 ENCOUNTER — Ambulatory Visit (HOSPITAL_BASED_OUTPATIENT_CLINIC_OR_DEPARTMENT_OTHER): Payer: PPO | Admitting: Hematology and Oncology

## 2016-06-12 ENCOUNTER — Encounter: Payer: Self-pay | Admitting: Hematology and Oncology

## 2016-06-12 ENCOUNTER — Ambulatory Visit (HOSPITAL_BASED_OUTPATIENT_CLINIC_OR_DEPARTMENT_OTHER): Payer: PPO

## 2016-06-12 ENCOUNTER — Encounter: Payer: Self-pay | Admitting: *Deleted

## 2016-06-12 VITALS — BP 114/57 | HR 120 | Temp 98.7°F | Resp 18 | Ht 64.0 in | Wt 153.4 lb

## 2016-06-12 DIAGNOSIS — R5383 Other fatigue: Secondary | ICD-10-CM | POA: Diagnosis not present

## 2016-06-12 DIAGNOSIS — Z170 Estrogen receptor positive status [ER+]: Secondary | ICD-10-CM | POA: Diagnosis not present

## 2016-06-12 DIAGNOSIS — D6481 Anemia due to antineoplastic chemotherapy: Secondary | ICD-10-CM | POA: Diagnosis not present

## 2016-06-12 DIAGNOSIS — Z5111 Encounter for antineoplastic chemotherapy: Secondary | ICD-10-CM | POA: Diagnosis not present

## 2016-06-12 DIAGNOSIS — C50212 Malignant neoplasm of upper-inner quadrant of left female breast: Secondary | ICD-10-CM

## 2016-06-12 LAB — COMPREHENSIVE METABOLIC PANEL
ALT: 32 U/L (ref 0–55)
ANION GAP: 8 meq/L (ref 3–11)
AST: 22 U/L (ref 5–34)
Albumin: 3.4 g/dL — ABNORMAL LOW (ref 3.5–5.0)
Alkaline Phosphatase: 169 U/L — ABNORMAL HIGH (ref 40–150)
BUN: 19.8 mg/dL (ref 7.0–26.0)
CHLORIDE: 106 meq/L (ref 98–109)
CO2: 27 meq/L (ref 22–29)
Calcium: 9.9 mg/dL (ref 8.4–10.4)
Creatinine: 1.5 mg/dL — ABNORMAL HIGH (ref 0.6–1.1)
EGFR: 34 mL/min/{1.73_m2} — AB (ref >90)
Glucose: 127 mg/dl (ref 70–140)
Potassium: 4.7 mEq/L (ref 3.5–5.1)
Sodium: 141 mEq/L (ref 136–145)
Total Bilirubin: 0.35 mg/dL (ref 0.20–1.20)
Total Protein: 6.4 g/dL (ref 6.4–8.3)

## 2016-06-12 LAB — CBC WITH DIFFERENTIAL/PLATELET
BASO%: 0.6 % (ref 0.0–2.0)
Basophils Absolute: 0.1 10*3/uL (ref 0.0–0.1)
EOS ABS: 0 10*3/uL (ref 0.0–0.5)
EOS%: 0.1 % (ref 0.0–7.0)
HCT: 25.5 % — ABNORMAL LOW (ref 34.8–46.6)
HGB: 8.3 g/dL — ABNORMAL LOW (ref 11.6–15.9)
LYMPH%: 7.9 % — AB (ref 14.0–49.7)
MCH: 29.7 pg (ref 25.1–34.0)
MCHC: 32.7 g/dL (ref 31.5–36.0)
MCV: 90.8 fL (ref 79.5–101.0)
MONO#: 1.2 10*3/uL — AB (ref 0.1–0.9)
MONO%: 11.1 % (ref 0.0–14.0)
NEUT#: 8.9 10*3/uL — ABNORMAL HIGH (ref 1.5–6.5)
NEUT%: 80.3 % — ABNORMAL HIGH (ref 38.4–76.8)
PLATELETS: 157 10*3/uL (ref 145–400)
RBC: 2.8 10*6/uL — AB (ref 3.70–5.45)
RDW: 19.1 % — AB (ref 11.2–14.5)
WBC: 11.1 10*3/uL — AB (ref 3.9–10.3)
lymph#: 0.9 10*3/uL (ref 0.9–3.3)

## 2016-06-12 MED ORDER — SODIUM CHLORIDE 0.9 % IV SOLN
Freq: Once | INTRAVENOUS | Status: AC
  Administered 2016-06-12: 13:00:00 via INTRAVENOUS

## 2016-06-12 MED ORDER — PROCHLORPERAZINE MALEATE 10 MG PO TABS
ORAL_TABLET | ORAL | Status: AC
  Filled 2016-06-12: qty 1

## 2016-06-12 MED ORDER — SODIUM CHLORIDE 0.9% FLUSH
10.0000 mL | INTRAVENOUS | Status: DC | PRN
  Administered 2016-06-12: 10 mL
  Filled 2016-06-12: qty 10

## 2016-06-12 MED ORDER — PACLITAXEL PROTEIN-BOUND CHEMO INJECTION 100 MG
80.0000 mg/m2 | Freq: Once | INTRAVENOUS | Status: AC
  Administered 2016-06-12: 150 mg via INTRAVENOUS
  Filled 2016-06-12: qty 30

## 2016-06-12 MED ORDER — HEPARIN SOD (PORK) LOCK FLUSH 100 UNIT/ML IV SOLN
500.0000 [IU] | Freq: Once | INTRAVENOUS | Status: AC | PRN
  Administered 2016-06-12: 500 [IU]
  Filled 2016-06-12: qty 5

## 2016-06-12 MED ORDER — PROCHLORPERAZINE MALEATE 10 MG PO TABS
10.0000 mg | ORAL_TABLET | Freq: Once | ORAL | Status: AC
  Administered 2016-06-12: 10 mg via ORAL

## 2016-06-12 NOTE — Patient Instructions (Signed)
[facility] Discharge Instructions for Patients Receiving Chemotherapy  Today you received the following chemotherapy agents: Abraxane.  To help prevent nausea and vomiting after your treatment, we encourage you to take your nausea medication as directed   If you develop nausea and vomiting that is not controlled by your nausea medication, call the clinic.   BELOW ARE SYMPTOMS THAT SHOULD BE REPORTED IMMEDIATELY:  *FEVER GREATER THAN 100.5 F  *CHILLS WITH OR WITHOUT FEVER  NAUSEA AND VOMITING THAT IS NOT CONTROLLED WITH YOUR NAUSEA MEDICATION  *UNUSUAL SHORTNESS OF BREATH  *UNUSUAL BRUISING OR BLEEDING  TENDERNESS IN MOUTH AND THROAT WITH OR WITHOUT PRESENCE OF ULCERS  *URINARY PROBLEMS  *BOWEL PROBLEMS  UNUSUAL RASH Items with * indicate a potential emergency and should be followed up as soon as possible.  Feel free to call the clinic you have any questions or concerns. The clinic phone number is [phone]. Nanoparticle Albumin-Bound Paclitaxel injection What is this medicine? NANOPARTICLE ALBUMIN-BOUND PACLITAXEL (Na no PAHR ti kuhl al BYOO muhn-bound PAK li TAX el) is a chemotherapy drug. It targets fast dividing cells, like cancer cells, and causes these cells to die. This medicine is used to treat advanced breast cancer and advanced lung cancer. This medicine may be used for other purposes; ask your health care provider or pharmacist if you have questions. What should I tell my health care provider before I take this medicine? They need to know if you have any of these conditions: -kidney disease -liver disease -low blood counts, like low platelets, red blood cells, or white blood cells -recent or ongoing radiation therapy -an unusual or allergic reaction to paclitaxel, albumin, other chemotherapy, other medicines, foods, dyes, or preservatives -pregnant or trying to get pregnant -breast-feeding How should I use this medicine? This drug is given as  an infusion into a vein. It is administered in a hospital or clinic by a specially trained health care professional. Talk to your pediatrician regarding the use of this medicine in children. Special care may be needed. Overdosage: If you think you have taken too much of this medicine contact a poison control center or emergency room at once. NOTE: This medicine is only for you. Do not share this medicine with others. What if I miss a dose? It is important not to miss your dose. Call your doctor or health care professional if you are unable to keep an appointment. What may interact with this medicine? -cyclosporine -diazepam -ketoconazole -medicines to increase blood counts like filgrastim, pegfilgrastim, sargramostim -other chemotherapy drugs like cisplatin, doxorubicin, epirubicin, etoposide, teniposide, vincristine -quinidine -testosterone -vaccines -verapamil Talk to your doctor or health care professional before taking any of these medicines: -acetaminophen -aspirin -ibuprofen -ketoprofen -naproxen This list may not describe all possible interactions. Give your health care provider a list of all the medicines, herbs, non-prescription drugs, or dietary supplements you use. Also tell them if you smoke, drink alcohol, or use illegal drugs. Some items may interact with your medicine. What should I watch for while using this medicine? Your condition will be monitored carefully while you are receiving this medicine. You will need important blood work done while you are taking this medicine. This drug may make you feel generally unwell. This is not uncommon, as chemotherapy can affect healthy cells as well as cancer cells. Report any side effects. Continue your course of treatment even though you feel ill unless your doctor tells you to stop. In some cases, you may be given additional medicines to  help with side effects. Follow all directions for their use. Call your doctor or health care  professional for advice if you get a fever, chills or sore throat, or other symptoms of a cold or flu. Do not treat yourself. This drug decreases your body's ability to fight infections. Try to avoid being around people who are sick. This medicine may increase your risk to bruise or bleed. Call your doctor or health care professional if you notice any unusual bleeding. Be careful brushing and flossing your teeth or using a toothpick because you may get an infection or bleed more easily. If you have any dental work done, tell your dentist you are receiving this medicine. Avoid taking products that contain aspirin, acetaminophen, ibuprofen, naproxen, or ketoprofen unless instructed by your doctor. These medicines may hide a fever. Do not become pregnant while taking this medicine. Women should inform their doctor if they wish to become pregnant or think they might be pregnant. There is a potential for serious side effects to an unborn child. Talk to your health care professional or pharmacist for more information. Do not breast-feed an infant while taking this medicine. Men are advised not to father a child while receiving this medicine. What side effects may I notice from receiving this medicine? Side effects that you should report to your doctor or health care professional as soon as possible: -allergic reactions like skin rash, itching or hives, swelling of the face, lips, or tongue -low blood counts - This drug may decrease the number of white blood cells, red blood cells and platelets. You may be at increased risk for infections and bleeding. -signs of infection - fever or chills, cough, sore throat, pain or difficulty passing urine -signs of decreased platelets or bleeding - bruising, pinpoint red spots on the skin, black, tarry stools, nosebleeds -signs of decreased red blood cells - unusually weak or tired, fainting spells, lightheadedness -breathing problems -changes in vision -chest  pain -high or low blood pressure -mouth sores -nausea and vomiting -pain, swelling, redness or irritation at the injection site -pain, tingling, numbness in the hands or feet -slow or irregular heartbeat -swelling of the ankle, feet, hands Side effects that usually do not require medical attention (report to your doctor or health care professional if they continue or are bothersome): -aches, pains -changes in the color of fingernails -diarrhea -hair loss -loss of appetite This list may not describe all possible side effects. Call your doctor for medical advice about side effects. You may report side effects to FDA at 1-800-FDA-1088. Where should I keep my medicine? This drug is given in a hospital or clinic and will not be stored at home. NOTE: This sheet is a summary. It may not cover all possible information. If you have questions about this medicine, talk to your doctor, pharmacist, or health care provider.    2016, Elsevier/Gold Standard. (2013-01-10 16:48:50)

## 2016-06-12 NOTE — Progress Notes (Signed)
Okay to treat with HR 120 per [name].

## 2016-06-12 NOTE — Progress Notes (Signed)
Patient Care Team: [name], MD as PCP - General Macarthur Critchley, OD as Referring Physician (Optometry) [name], MD as Consulting Physician (Dermatology) Latanya Maudlin, MD as Consulting Physician (Orthopedic Surgery) Inda Castle, MD as Consulting Physician (Gastroenterology)  DIAGNOSIS: Breast cancer of upper-inner quadrant of left female breast [facility])   Staging form: Breast, AJCC 7th Edition     Clinical stage from 03/05/2016: Stage IIA (T2, N0, M0) - Unsigned     Pathologic: Stage IIB (T3, N0, cM0) - Signed by [name], MD on 03/27/2016   SUMMARY OF ONCOLOGIC HISTORY:   Breast cancer of upper-inner quadrant of left female breast ([place])   02/26/2016 Initial Diagnosis Left breast biopsy 11:00 position: invasive ductal carcinoma with DCIS, ER 90%, PR 10%, HER-2 negative, Ki-67 30%, grade 2, 2.2 cm palpable lesion T2 N0 stage II a clinical stage   03/18/2016 Surgery Left lumpectomy: Invasive ductal carcinoma, grade 2, 6.3 cm, with high-grade DCIS, margins negative, 0/4 lymph nodes negative, ER 90%, via 10%, HER-2 negative ratio 0.97, Ki-67 30%, T3 N0 stage IIB   03/25/2016 Procedure Genetic testing is negative for pathogenic mutations within any of the 20 Genes on the breast/ovarian cancer panel   04/04/2016 Oncotype testing Oncotype DX recurrence score 37, 25% 10 year distant risk of recurrence   04/17/2016 -  Chemotherapy Adjuvant chemotherapy with dose dense Adriamycin and Cytoxan followed by Abraxane weekly 12    CHIEF COMPLIANT: Cycle 1 Abraxane  INTERVAL HISTORY: Kayla Baker is a 80 year old with above-mentioned history of left breast cancer currently on adjuvant chemotherapy and today is cycle 1 of Abraxane. She reports fatigue and some shortness of breath to exertion. She denies any lightheadedness or dizziness.  REVIEW OF SYSTEMS:   Constitutional: Denies fevers, chills or abnormal weight loss Eyes: Denies blurriness of vision Ears, nose, mouth, throat, and face: Denies  mucositis or sore throat Respiratory: Denies cough, dyspnea or wheezes Cardiovascular: Denies palpitation, chest discomfort Gastrointestinal:  Denies nausea, heartburn or change in bowel habits Skin: Denies abnormal skin rashes Lymphatics: Denies new lymphadenopathy or easy bruising Neurological:Denies numbness, tingling or new weaknesses Behavioral/Psych: Mood is stable, no new changes  Extremities: No lower extremity edema Breast:  denies any pain or lumps or nodules in either breasts All other systems were reviewed with the patient and are negative.  I have reviewed the past medical history, past surgical history, social history and family history with the patient and they are unchanged from previous note.  ALLERGIES:  is allergic to keflex; meloxicam; prednisone; premarin; and trovan.  MEDICATIONS:  Current Outpatient Prescriptions  Medication Sig Dispense Refill  . allopurinol (ZYLOPRIM) 300 MG tablet TAKE 1 TABLET BY MOUTH EVERY DAY 90 tablet 1  . amitriptyline (ELAVIL) 25 MG tablet Take 1 to 2 tablets at bedtime for neuritis 180 tablet 1  . aspirin 81 MG tablet Take 81 mg by mouth daily.    Marland Kitchen atorvastatin (LIPITOR) 40 MG tablet TAKE 1 TABLET BY MOUTH EVERY DAY 90 tablet 1  . chlorhexidine (HIBICLENS) 4 % external liquid Apply 15 mLs (1 application total) topically once. (Patient not taking: Reported on 04/16/2016) 120 mL 0  . enalapril (VASOTEC) 20 MG tablet TAKE 1 TABLET BY MOUTH DAILY 90 tablet 0  . FREESTYLE LITE test strip USE ONE STRIP TO CHECK GLUCOSE ONCE DAILY. 100 each 0  . furosemide (LASIX) 20 MG tablet Take 1 tablet (20 mg total) by mouth daily. 20 tablet 0  . gabapentin (NEURONTIN) 100 MG capsule Take  1 to 3 capsules at bedtime for neuritis 90 capsule 5  . hydrochlorothiazide (HYDRODIURIL) 25 MG tablet TAKE 1 TABLET BY MOUTH DAILY 90 tablet 2  . lactated ringers infusion Inject 125 mLs into the vein continuous. (Patient not taking: Reported on 04/16/2016) 250 mL 0  .  LORazepam (ATIVAN) 0.5 MG tablet Take 0.5 mg by mouth 3 times/day as needed-between meals & bedtime for anxiety or sleep.    . ranitidine (ZANTAC) 300 MG tablet Take twice a day as needed for Heartburn & Acid Indigestion 60 tablet 3  . vitamin C (ASCORBIC ACID) 500 MG tablet Take 500 mg by mouth daily. Takes 2 tablets daily     No current facility-administered medications for this visit.    PHYSICAL EXAMINATION: ECOG PERFORMANCE STATUS: 1 - Symptomatic but completely ambulatory  Filed Vitals:   06/12/16 1151  BP: 114/57  Pulse: 120  Temp: 98.7 F (37.1 C)  Resp: 18   Filed Weights   06/12/16 1151  Weight: 153 lb 6.4 oz (69.582 kg)    GENERAL:alert, no distress and comfortable SKIN: skin color, texture, turgor are normal, no rashes or significant lesions EYES: normal, Conjunctiva are pink and non-injected, sclera clear OROPHARYNX:no exudate, no erythema and lips, buccal mucosa, and tongue normal  NECK: supple, thyroid normal size, non-tender, without nodularity LYMPH:  no palpable lymphadenopathy in the cervical, axillary or inguinal LUNGS: clear to auscultation and percussion with normal breathing effort HEART: regular rate & rhythm and no murmurs and no lower extremity edema ABDOMEN:abdomen soft, non-tender and normal bowel sounds MUSCULOSKELETAL:no cyanosis of digits and no clubbing  NEURO: alert & oriented x 3 with fluent speech, no focal motor/sensory deficits EXTREMITIES: No lower extremity edema  LABORATORY DATA:  I have reviewed the data as listed   Chemistry      Component Value Date/Time   NA 143 05/29/2016 1033   NA 140 04/02/2016 0940   K 4.6 05/29/2016 1033   K 3.9 04/02/2016 0940   CL 103 04/02/2016 0940   CO2 26 05/29/2016 1033   CO2 26 04/02/2016 0940   BUN 23.2 05/29/2016 1033   BUN 25 04/02/2016 0940   CREATININE 1.5* 05/29/2016 1033   CREATININE 1.40* 04/02/2016 0940   CREATININE 1.50* 03/18/2016 1116      Component Value Date/Time   CALCIUM  10.1 05/29/2016 1033   CALCIUM 9.7 04/02/2016 0940   ALKPHOS 215* 05/29/2016 1033   ALKPHOS 115 04/02/2016 0940   AST 40* 05/29/2016 1033   AST 43* 04/02/2016 0940   ALT 66* 05/29/2016 1033   ALT 70* 04/02/2016 0940   BILITOT <0.30 05/29/2016 1033   BILITOT 0.4 04/02/2016 0940       Lab Results  Component Value Date   WBC 11.1* 06/12/2016   HGB 8.3* 06/12/2016   HCT 25.5* 06/12/2016   MCV 90.8 06/12/2016   PLT 157 06/12/2016   NEUTROABS 8.9* 06/12/2016     ASSESSMENT & PLAN:  Breast cancer of upper-inner quadrant of left female breast ([place]) Left lumpectomy 03/08/2016: Invasive ductal carcinoma, grade 2, 6.3 cm, with high-grade DCIS, margins negative, 0/4 lymph nodes negative, ER 90%, via 10%, HER-2 negative ratio 0.97, Ki-67 30%, T3 N0 stage IIB  Oncotype DX counseling: Patient has a score of 69 which translates into 25% 10 year risk of distant recurrence with tamoxifen alone. Based on this, I strongly recommend systemic chemotherapy.  Treatment plan: 1. Adjuvant chemotherapy with dose dense Adriamycin and Cytoxan 4 followed by Abraxane weekly 12 2. Adjuvant  radiation therapy followed by 3. Adjuvant antiestrogen therapy ---------------------------------------------------------------------------------------------- Current treatment: completed Cycle 4 to cycle of dose dense Adriamycin and Cytoxan (started at a slightly lower dose given her age), Today is Abrance cycle 1/12 Echocardiogram revealed EF of 60-65% on 04/16/2016  Chemotherapy toxicities: 1. Fatigue due to chemotherapy  2. Elevated ALT: Not due to chemotherapy, we will continue to watch and monitor this 3. Chemotherapy-induced anemia: Hemoglobin 9.6 4. Lower extremity edema: I prescribed her Lasix when necessary  RTC in One week for toxicity check.   No orders of the defined types were placed in this encounter.   The patient has a good understanding of the overall plan. she agrees with it. she will call with  any problems that may develop before the next visit here.   Rulon Eisenmenger, MD 06/12/2016

## 2016-06-19 ENCOUNTER — Encounter: Payer: Self-pay | Admitting: Hematology and Oncology

## 2016-06-19 ENCOUNTER — Other Ambulatory Visit (HOSPITAL_BASED_OUTPATIENT_CLINIC_OR_DEPARTMENT_OTHER): Payer: PPO

## 2016-06-19 ENCOUNTER — Encounter: Payer: Self-pay | Admitting: *Deleted

## 2016-06-19 ENCOUNTER — Ambulatory Visit (HOSPITAL_BASED_OUTPATIENT_CLINIC_OR_DEPARTMENT_OTHER): Payer: PPO | Admitting: Hematology and Oncology

## 2016-06-19 ENCOUNTER — Ambulatory Visit (HOSPITAL_BASED_OUTPATIENT_CLINIC_OR_DEPARTMENT_OTHER): Payer: PPO

## 2016-06-19 VITALS — BP 103/55 | HR 118 | Temp 98.0°F | Resp 17 | Wt 153.8 lb

## 2016-06-19 DIAGNOSIS — C50212 Malignant neoplasm of upper-inner quadrant of left female breast: Secondary | ICD-10-CM | POA: Diagnosis not present

## 2016-06-19 DIAGNOSIS — Z170 Estrogen receptor positive status [ER+]: Secondary | ICD-10-CM

## 2016-06-19 DIAGNOSIS — Z5111 Encounter for antineoplastic chemotherapy: Secondary | ICD-10-CM

## 2016-06-19 DIAGNOSIS — D6481 Anemia due to antineoplastic chemotherapy: Secondary | ICD-10-CM | POA: Diagnosis not present

## 2016-06-19 LAB — CBC WITH DIFFERENTIAL/PLATELET
BASO%: 2 % (ref 0.0–2.0)
BASOS ABS: 0.1 10*3/uL (ref 0.0–0.1)
EOS%: 1.6 % (ref 0.0–7.0)
Eosinophils Absolute: 0.1 10*3/uL (ref 0.0–0.5)
HCT: 23 % — ABNORMAL LOW (ref 34.8–46.6)
HEMOGLOBIN: 7.7 g/dL — AB (ref 11.6–15.9)
LYMPH%: 18 % (ref 14.0–49.7)
MCH: 30.2 pg (ref 25.1–34.0)
MCHC: 33.5 g/dL (ref 31.5–36.0)
MCV: 90.2 fL (ref 79.5–101.0)
MONO#: 1.2 10*3/uL — ABNORMAL HIGH (ref 0.1–0.9)
MONO%: 22.7 % — AB (ref 0.0–14.0)
NEUT#: 2.8 10*3/uL (ref 1.5–6.5)
NEUT%: 55.7 % (ref 38.4–76.8)
Platelets: 232 10*3/uL (ref 145–400)
RBC: 2.55 10*6/uL — ABNORMAL LOW (ref 3.70–5.45)
RDW: 18.6 % — AB (ref 11.2–14.5)
WBC: 5.1 10*3/uL (ref 3.9–10.3)
lymph#: 0.9 10*3/uL (ref 0.9–3.3)

## 2016-06-19 LAB — COMPREHENSIVE METABOLIC PANEL
ALT: 28 U/L (ref 0–55)
AST: 22 U/L (ref 5–34)
Albumin: 3.5 g/dL (ref 3.5–5.0)
Alkaline Phosphatase: 128 U/L (ref 40–150)
Anion Gap: 10 mEq/L (ref 3–11)
BUN: 26.6 mg/dL — AB (ref 7.0–26.0)
CHLORIDE: 107 meq/L (ref 98–109)
CO2: 24 meq/L (ref 22–29)
Calcium: 9.9 mg/dL (ref 8.4–10.4)
Creatinine: 1.7 mg/dL — ABNORMAL HIGH (ref 0.6–1.1)
EGFR: 28 mL/min/{1.73_m2} — ABNORMAL LOW (ref >90)
GLUCOSE: 165 mg/dL — AB (ref 70–140)
POTASSIUM: 4.5 meq/L (ref 3.5–5.1)
SODIUM: 141 meq/L (ref 136–145)
Total Bilirubin: 0.31 mg/dL (ref 0.20–1.20)
Total Protein: 6.3 g/dL — ABNORMAL LOW (ref 6.4–8.3)

## 2016-06-19 MED ORDER — PROCHLORPERAZINE MALEATE 10 MG PO TABS
10.0000 mg | ORAL_TABLET | Freq: Once | ORAL | Status: AC
  Administered 2016-06-19: 10 mg via ORAL

## 2016-06-19 MED ORDER — SODIUM CHLORIDE 0.9% FLUSH
10.0000 mL | INTRAVENOUS | Status: DC | PRN
  Administered 2016-06-19: 10 mL
  Filled 2016-06-19: qty 10

## 2016-06-19 MED ORDER — SODIUM CHLORIDE 0.9 % IV SOLN
Freq: Once | INTRAVENOUS | Status: AC
  Administered 2016-06-19: 13:00:00 via INTRAVENOUS

## 2016-06-19 MED ORDER — PACLITAXEL PROTEIN-BOUND CHEMO INJECTION 100 MG
80.0000 mg/m2 | Freq: Once | INTRAVENOUS | Status: AC
  Administered 2016-06-19: 150 mg via INTRAVENOUS
  Filled 2016-06-19: qty 30

## 2016-06-19 MED ORDER — PROCHLORPERAZINE MALEATE 10 MG PO TABS
ORAL_TABLET | ORAL | Status: AC
  Filled 2016-06-19: qty 1

## 2016-06-19 MED ORDER — HEPARIN SOD (PORK) LOCK FLUSH 100 UNIT/ML IV SOLN
500.0000 [IU] | Freq: Once | INTRAVENOUS | Status: AC | PRN
  Administered 2016-06-19: 500 [IU]
  Filled 2016-06-19: qty 5

## 2016-06-19 MED ORDER — SODIUM CHLORIDE 0.9 % IV SOLN: Freq: Once | INTRAVENOUS | Status: DC

## 2016-06-19 NOTE — Progress Notes (Signed)
Per [name], okay to treat pt with HR 118.

## 2016-06-19 NOTE — Progress Notes (Signed)
Patient Care Team: [name], MD as PCP - General Macarthur Critchley, OD as Referring Physician (Optometry) [name], MD as Consulting Physician (Dermatology) Latanya Maudlin, MD as Consulting Physician (Orthopedic Surgery) Inda Castle, MD as Consulting Physician (Gastroenterology)  DIAGNOSIS: Breast cancer of upper-inner quadrant of left female breast [facility])   Staging form: Breast, AJCC 7th Edition     Clinical stage from 03/05/2016: Stage IIA (T2, N0, M0) - Unsigned     Pathologic: Stage IIB (T3, N0, cM0) - Signed by [name], MD on 03/27/2016   SUMMARY OF ONCOLOGIC HISTORY:   Breast cancer of upper-inner quadrant of left female breast ([place])   02/26/2016 Initial Diagnosis Left breast biopsy 11:00 position: invasive ductal carcinoma with DCIS, ER 90%, PR 10%, HER-2 negative, Ki-67 30%, grade 2, 2.2 cm palpable lesion T2 N0 stage II a clinical stage   03/18/2016 Surgery Left lumpectomy: Invasive ductal carcinoma, grade 2, 6.3 cm, with high-grade DCIS, margins negative, 0/4 lymph nodes negative, ER 90%, via 10%, HER-2 negative ratio 0.97, Ki-67 30%, T3 N0 stage IIB   03/25/2016 Procedure Genetic testing is negative for pathogenic mutations within any of the 20 Genes on the breast/ovarian cancer panel   04/04/2016 Oncotype testing Oncotype DX recurrence score 37, 25% 10 year distant risk of recurrence   04/17/2016 -  Chemotherapy Adjuvant chemotherapy with dose dense Adriamycin and Cytoxan followed by Abraxane weekly 12    CHIEF COMPLIANT: Abraxane cycle 2  INTERVAL HISTORY: Kayla Baker is a 80 year old with above-mentioned history left breast cancer who is currently on adjuvant chemotherapy with Abraxane. Today cycle 2. She appears to be tolerating cycle extremely well. She did have mild fatigue. Denies any nausea or vomiting. Her major complaint is fatigue with minimal exertion. This is probably related to her severe anemia.  REVIEW OF SYSTEMS:   Constitutional: Denies fevers, chills or  abnormal weight loss Eyes: Denies blurriness of vision Ears, nose, mouth, throat, and face: Denies mucositis or sore throat Respiratory: Denies cough, dyspnea or wheezes Cardiovascular: Denies palpitation, chest discomfort Gastrointestinal:  Denies nausea, heartburn or change in bowel habits Skin: Denies abnormal skin rashes Lymphatics: Denies new lymphadenopathy or easy bruising Neurological:Denies numbness, tingling or new weaknesses Behavioral/Psych: Mood is stable, no new changes  Extremities: No lower extremity edema Breast:  denies any pain or lumps or nodules in either breasts All other systems were reviewed with the patient and are negative.  I have reviewed the past medical history, past surgical history, social history and family history with the patient and they are unchanged from previous note.  ALLERGIES:  is allergic to keflex; meloxicam; prednisone; premarin; and trovan.  MEDICATIONS:  Current Outpatient Prescriptions  Medication Sig Dispense Refill  . allopurinol (ZYLOPRIM) 300 MG tablet TAKE 1 TABLET BY MOUTH EVERY DAY 90 tablet 1  . amitriptyline (ELAVIL) 25 MG tablet Take 1 to 2 tablets at bedtime for neuritis 180 tablet 1  . aspirin 81 MG tablet Take 81 mg by mouth daily.    Marland Kitchen atorvastatin (LIPITOR) 40 MG tablet TAKE 1 TABLET BY MOUTH EVERY DAY 90 tablet 1  . chlorhexidine (HIBICLENS) 4 % external liquid Apply 15 mLs (1 application total) topically once. (Patient not taking: Reported on 04/16/2016) 120 mL 0  . enalapril (VASOTEC) 20 MG tablet TAKE 1 TABLET BY MOUTH DAILY 90 tablet 0  . FREESTYLE LITE test strip USE ONE STRIP TO CHECK GLUCOSE ONCE DAILY. 100 each 0  . furosemide (LASIX) 20 MG tablet Take 1 tablet (  20 mg total) by mouth daily. 20 tablet 0  . gabapentin (NEURONTIN) 100 MG capsule Take 1 to 3 capsules at bedtime for neuritis 90 capsule 5  . hydrochlorothiazide (HYDRODIURIL) 25 MG tablet TAKE 1 TABLET BY MOUTH DAILY 90 tablet 2  . lactated ringers  infusion Inject 125 mLs into the vein continuous. (Patient not taking: Reported on 04/16/2016) 250 mL 0  . LORazepam (ATIVAN) 0.5 MG tablet Take 0.5 mg by mouth 3 times/day as needed-between meals & bedtime for anxiety or sleep.    . ranitidine (ZANTAC) 300 MG tablet Take twice a day as needed for Heartburn & Acid Indigestion 60 tablet 3  . vitamin C (ASCORBIC ACID) 500 MG tablet Take 500 mg by mouth daily. Takes 2 tablets daily     No current facility-administered medications for this visit.    PHYSICAL EXAMINATION: ECOG PERFORMANCE STATUS: 1 - Symptomatic but completely ambulatory  Filed Vitals:   06/19/16 1148  BP: 103/55  Pulse: 118  Temp: 98 F (36.7 C)  Resp: 17   Filed Weights   06/19/16 1148  Weight: 153 lb 12.8 oz (69.763 kg)    GENERAL:alert, no distress and comfortable SKIN: skin color, texture, turgor are normal, no rashes or significant lesions EYES: normal, Conjunctiva are pink and non-injected, sclera clear OROPHARYNX:no exudate, no erythema and lips, buccal mucosa, and tongue normal  NECK: supple, thyroid normal size, non-tender, without nodularity LYMPH:  no palpable lymphadenopathy in the cervical, axillary or inguinal LUNGS: clear to auscultation and percussion with normal breathing effort HEART: regular rate & rhythm and no murmurs and no lower extremity edema ABDOMEN:abdomen soft, non-tender and normal bowel sounds MUSCULOSKELETAL:no cyanosis of digits and no clubbing  NEURO: alert & oriented x 3 with fluent speech, no focal motor/sensory deficits EXTREMITIES: No lower extremity edema  LABORATORY DATA:  I have reviewed the data as listed   Chemistry      Component Value Date/Time   NA 141 06/19/2016 1137   NA 140 04/02/2016 0940   K 4.5 06/19/2016 1137   K 3.9 04/02/2016 0940   CL 103 04/02/2016 0940   CO2 24 06/19/2016 1137   CO2 26 04/02/2016 0940   BUN 26.6* 06/19/2016 1137   BUN 25 04/02/2016 0940   CREATININE 1.7* 06/19/2016 1137    CREATININE 1.40* 04/02/2016 0940   CREATININE 1.50* 03/18/2016 1116      Component Value Date/Time   CALCIUM 9.9 06/19/2016 1137   CALCIUM 9.7 04/02/2016 0940   ALKPHOS 128 06/19/2016 1137   ALKPHOS 115 04/02/2016 0940   AST 22 06/19/2016 1137   AST 43* 04/02/2016 0940   ALT 28 06/19/2016 1137   ALT 70* 04/02/2016 0940   BILITOT 0.31 06/19/2016 1137   BILITOT 0.4 04/02/2016 0940       Lab Results  Component Value Date   WBC 5.1 06/19/2016   HGB 7.7* 06/19/2016   HCT 23.0* 06/19/2016   MCV 90.2 06/19/2016   PLT 232 06/19/2016   NEUTROABS 2.8 06/19/2016     ASSESSMENT & PLAN:  Breast cancer of upper-inner quadrant of left female breast ([place]) Left lumpectomy 03/08/2016: Invasive ductal carcinoma, grade 2, 6.3 cm, with high-grade DCIS, margins negative, 0/4 lymph nodes negative, ER 90%, via 10%, HER-2 negative ratio 0.97, Ki-67 30%, T3 N0 stage IIB  Oncotype DX counseling: Patient has a score of 45 which translates into 25% 10 year risk of distant recurrence with tamoxifen alone. Based on this, I strongly recommend systemic chemotherapy.  Treatment plan:  1. Adjuvant chemotherapy with dose dense Adriamycin and Cytoxan 4 followed by Abraxane weekly 12 2. Adjuvant radiation therapy followed by 3. Adjuvant antiestrogen therapy ---------------------------------------------------------------------------------------------- Current treatment: completed 4 cycles of dose dense Adriamycin and Cytoxan (started at a slightly lower dose given her age), Today is Abrance cycle 2/12 Echocardiogram revealed EF of 60-65% on 04/16/2016  Chemotherapy toxicities: 1. Fatigue due to chemotherapy  2. Elevated ALT: Not due to chemotherapy, we will continue to watch and monitor this 3. Chemotherapy-induced anemia: Hemoglobin 7.7. I discussed the pros and cons of blood transfusions. We willing to watch and monitor it for another week or 4. Lower extremity edema: Because of renal dysfunction, I  encouraged her to stop Lasix. 5. Tachycardia: Probably related to anemia.  RTC in 2 weeks for toxicity check with cycle 4.   No orders of the defined types were placed in this encounter.   The patient has a good understanding of the overall plan. she agrees with it. she will call with any problems that may develop before the next visit here.   Rulon Eisenmenger, MD 06/19/2016

## 2016-06-19 NOTE — Progress Notes (Signed)
Reviewed labs. HGB 7.7 and Creatinine 1.7. Pt to receive 500 ccNS  Over 1-2 hours and ok to treat otherwise with Abraxane per [name].  Next lab work scheduled for 06/26/16. If HGB <7.7 may need transfusion.

## 2016-06-19 NOTE — Assessment & Plan Note (Signed)
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 Based on this, I strongly recommend systemic chemotherapy.  Treatment plan: 1. Adjuvant chemotherapy with dose dense Adriamycin and Cytoxan 4 followed by Abraxane weekly 12 2. Adjuvant radiation therapy followed by 3. Adjuvant antiestrogen therapy ---------------------------------------------------------------------------------------------- Current treatment: completed 4 cycles of dose dense Adriamycin and Cytoxan (started at a slightly lower dose given her age), Today is Abrance cycle 2/12 Echocardiogram revealed EF of 60-65% on 04/16/2016  Chemotherapy toxicities: 1. Fatigue due to chemotherapy  2. Elevated ALT: Not due to chemotherapy, we will continue to watch and monitor this 3. Chemotherapy-induced anemia: Hemoglobin 9.6 4. Lower extremity edema: I prescribed her Lasix when necessary  RTC in 2 weeks for toxicity check with cycle 4.

## 2016-06-19 NOTE — Patient Instructions (Signed)
[facility] Discharge Instructions for Patients Receiving Chemotherapy  Today you received the following chemotherapy agents: Abraxane.  To help prevent nausea and vomiting after your treatment, we encourage you to take your nausea medication: Lorazepam 0.5 mg every 6 hours as needed for nausea.   If you develop nausea and vomiting that is not controlled by your nausea medication, call the clinic.   BELOW ARE SYMPTOMS THAT SHOULD BE REPORTED IMMEDIATELY:  *FEVER GREATER THAN 100.5 F  *CHILLS WITH OR WITHOUT FEVER  NAUSEA AND VOMITING THAT IS NOT CONTROLLED WITH YOUR NAUSEA MEDICATION  *UNUSUAL SHORTNESS OF BREATH  *UNUSUAL BRUISING OR BLEEDING  TENDERNESS IN MOUTH AND THROAT WITH OR WITHOUT PRESENCE OF ULCERS  *URINARY PROBLEMS  *BOWEL PROBLEMS  UNUSUAL RASH Items with * indicate a potential emergency and should be followed up as soon as possible.  Feel free to call the clinic you have any questions or concerns. The clinic phone number is [phone].  Please show the [place] at check-in to the Emergency Department and triage nurse.

## 2016-06-26 ENCOUNTER — Ambulatory Visit (HOSPITAL_BASED_OUTPATIENT_CLINIC_OR_DEPARTMENT_OTHER): Payer: PPO

## 2016-06-26 ENCOUNTER — Ambulatory Visit: Payer: PPO | Admitting: Hematology and Oncology

## 2016-06-26 ENCOUNTER — Other Ambulatory Visit (HOSPITAL_BASED_OUTPATIENT_CLINIC_OR_DEPARTMENT_OTHER): Payer: PPO

## 2016-06-26 VITALS — BP 121/64 | HR 102 | Temp 98.7°F | Resp 16

## 2016-06-26 DIAGNOSIS — C50212 Malignant neoplasm of upper-inner quadrant of left female breast: Secondary | ICD-10-CM

## 2016-06-26 DIAGNOSIS — Z5111 Encounter for antineoplastic chemotherapy: Secondary | ICD-10-CM

## 2016-06-26 LAB — COMPREHENSIVE METABOLIC PANEL
ALT: 30 U/L (ref 0–55)
ANION GAP: 13 meq/L — AB (ref 3–11)
AST: 27 U/L (ref 5–34)
Albumin: 3.5 g/dL (ref 3.5–5.0)
Alkaline Phosphatase: 121 U/L (ref 40–150)
BUN: 23.5 mg/dL (ref 7.0–26.0)
CALCIUM: 9.7 mg/dL (ref 8.4–10.4)
CHLORIDE: 106 meq/L (ref 98–109)
CO2: 22 meq/L (ref 22–29)
CREATININE: 1.6 mg/dL — AB (ref 0.6–1.1)
EGFR: 30 mL/min/{1.73_m2} — ABNORMAL LOW (ref >90)
Glucose: 245 mg/dl — ABNORMAL HIGH (ref 70–140)
POTASSIUM: 4.4 meq/L (ref 3.5–5.1)
Sodium: 140 mEq/L (ref 136–145)
Total Bilirubin: 0.41 mg/dL (ref 0.20–1.20)
Total Protein: 6.1 g/dL — ABNORMAL LOW (ref 6.4–8.3)

## 2016-06-26 LAB — CBC WITH DIFFERENTIAL/PLATELET
BASO%: 2.2 % — ABNORMAL HIGH (ref 0.0–2.0)
BASOS ABS: 0.1 10*3/uL (ref 0.0–0.1)
EOS ABS: 0.1 10*3/uL (ref 0.0–0.5)
EOS%: 4.2 % (ref 0.0–7.0)
HEMATOCRIT: 24 % — AB (ref 34.8–46.6)
HGB: 8 g/dL — ABNORMAL LOW (ref 11.6–15.9)
LYMPH#: 0.8 10*3/uL — AB (ref 0.9–3.3)
LYMPH%: 25.8 % (ref 14.0–49.7)
MCH: 31 pg (ref 25.1–34.0)
MCHC: 33.2 g/dL (ref 31.5–36.0)
MCV: 93.4 fL (ref 79.5–101.0)
MONO#: 0.3 10*3/uL (ref 0.1–0.9)
MONO%: 9.1 % (ref 0.0–14.0)
NEUT#: 1.8 10*3/uL (ref 1.5–6.5)
NEUT%: 58.7 % (ref 38.4–76.8)
PLATELETS: 185 10*3/uL (ref 145–400)
RBC: 2.57 10*6/uL — AB (ref 3.70–5.45)
RDW: 21 % — ABNORMAL HIGH (ref 11.2–14.5)
WBC: 3.2 10*3/uL — ABNORMAL LOW (ref 3.9–10.3)

## 2016-06-26 MED ORDER — PACLITAXEL PROTEIN-BOUND CHEMO INJECTION 100 MG
80.0000 mg/m2 | Freq: Once | INTRAVENOUS | Status: AC
  Administered 2016-06-26: 150 mg via INTRAVENOUS
  Filled 2016-06-26: qty 30

## 2016-06-26 MED ORDER — SODIUM CHLORIDE 0.9 % IV SOLN
Freq: Once | INTRAVENOUS | Status: AC
  Administered 2016-06-26: 09:00:00 via INTRAVENOUS

## 2016-06-26 MED ORDER — PROCHLORPERAZINE MALEATE 10 MG PO TABS
ORAL_TABLET | ORAL | Status: AC
  Filled 2016-06-26: qty 1

## 2016-06-26 MED ORDER — SODIUM CHLORIDE 0.9% FLUSH
10.0000 mL | INTRAVENOUS | Status: DC | PRN
  Administered 2016-06-26: 10 mL
  Filled 2016-06-26: qty 10

## 2016-06-26 MED ORDER — HEPARIN SOD (PORK) LOCK FLUSH 100 UNIT/ML IV SOLN
500.0000 [IU] | Freq: Once | INTRAVENOUS | Status: AC | PRN
  Administered 2016-06-26: 500 [IU]
  Filled 2016-06-26: qty 5

## 2016-06-26 MED ORDER — PROCHLORPERAZINE MALEATE 10 MG PO TABS
10.0000 mg | ORAL_TABLET | Freq: Once | ORAL | Status: AC
  Administered 2016-06-26: 10 mg via ORAL

## 2016-06-26 NOTE — Patient Instructions (Signed)
[facility] Discharge Instructions for Patients Receiving Chemotherapy  Today you received the following chemotherapy agents:  Abraxane.  To help prevent nausea and vomiting after your treatment, we encourage you to take your nausea medication as directed.   If you develop nausea and vomiting that is not controlled by your nausea medication, call the clinic.   BELOW ARE SYMPTOMS THAT SHOULD BE REPORTED IMMEDIATELY:  *FEVER GREATER THAN 100.5 F  *CHILLS WITH OR WITHOUT FEVER  NAUSEA AND VOMITING THAT IS NOT CONTROLLED WITH YOUR NAUSEA MEDICATION  *UNUSUAL SHORTNESS OF BREATH  *UNUSUAL BRUISING OR BLEEDING  TENDERNESS IN MOUTH AND THROAT WITH OR WITHOUT PRESENCE OF ULCERS  *URINARY PROBLEMS  *BOWEL PROBLEMS  UNUSUAL RASH Items with * indicate a potential emergency and should be followed up as soon as possible.  Feel free to call the clinic you have any questions or concerns. The clinic phone number is [phone].  Please show the [facility] at check-in to the Emergency Department and triage nurse.

## 2016-06-26 NOTE — Progress Notes (Signed)
Okay to treat with creatinine of 1.6 and heart rate of 102 per [name].

## 2016-06-30 DIAGNOSIS — D485 Neoplasm of uncertain behavior of skin: Secondary | ICD-10-CM | POA: Diagnosis not present

## 2016-06-30 DIAGNOSIS — Z872 Personal history of diseases of the skin and subcutaneous tissue: Secondary | ICD-10-CM | POA: Diagnosis not present

## 2016-06-30 DIAGNOSIS — L820 Inflamed seborrheic keratosis: Secondary | ICD-10-CM | POA: Diagnosis not present

## 2016-06-30 DIAGNOSIS — L821 Other seborrheic keratosis: Secondary | ICD-10-CM | POA: Diagnosis not present

## 2016-06-30 DIAGNOSIS — L570 Actinic keratosis: Secondary | ICD-10-CM | POA: Diagnosis not present

## 2016-06-30 DIAGNOSIS — Z85828 Personal history of other malignant neoplasm of skin: Secondary | ICD-10-CM | POA: Diagnosis not present

## 2016-06-30 DIAGNOSIS — D225 Melanocytic nevi of trunk: Secondary | ICD-10-CM | POA: Diagnosis not present

## 2016-06-30 DIAGNOSIS — L814 Other melanin hyperpigmentation: Secondary | ICD-10-CM | POA: Diagnosis not present

## 2016-07-02 NOTE — Assessment & Plan Note (Signed)
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 Based on this, I strongly recommend systemic chemotherapy.  Treatment plan: 1. Adjuvant chemotherapy with dose dense Adriamycin and Cytoxan 4 followed by Abraxane weekly 12 2. Adjuvant radiation therapy followed by 3. Adjuvant antiestrogen therapy ---------------------------------------------------------------------------------------------- Current treatment: completed 4 cycles of dose dense Adriamycin and Cytoxan (started at a slightly lower dose given her age), Today is Abrance cycle 4/12 Echocardiogram revealed EF of 60-65% on 04/16/2016  Chemotherapy toxicities: 1. Fatigue due to chemotherapy  2. Elevated ALT: Not due to chemotherapy, we will continue to watch and monitor this 3. Chemotherapy-induced anemia: Hemoglobin 7.7. I discussed the pros and cons of blood transfusions. We willing to watch and monitor it for another week 4. Lower extremity edema: Because of renal dysfunction, I encouraged her to stop Lasix. 5. Tachycardia: Probably related to anemia.  RTC in 2 weeks for toxicity check with cycle 6.

## 2016-07-03 ENCOUNTER — Other Ambulatory Visit (HOSPITAL_BASED_OUTPATIENT_CLINIC_OR_DEPARTMENT_OTHER): Payer: PPO

## 2016-07-03 ENCOUNTER — Ambulatory Visit (HOSPITAL_BASED_OUTPATIENT_CLINIC_OR_DEPARTMENT_OTHER): Payer: PPO

## 2016-07-03 ENCOUNTER — Telehealth: Payer: Self-pay | Admitting: Hematology and Oncology

## 2016-07-03 ENCOUNTER — Encounter: Payer: Self-pay | Admitting: Hematology and Oncology

## 2016-07-03 ENCOUNTER — Ambulatory Visit (HOSPITAL_BASED_OUTPATIENT_CLINIC_OR_DEPARTMENT_OTHER): Payer: PPO | Admitting: Hematology and Oncology

## 2016-07-03 ENCOUNTER — Encounter: Payer: Self-pay | Admitting: *Deleted

## 2016-07-03 DIAGNOSIS — Z170 Estrogen receptor positive status [ER+]: Secondary | ICD-10-CM

## 2016-07-03 DIAGNOSIS — C50212 Malignant neoplasm of upper-inner quadrant of left female breast: Secondary | ICD-10-CM

## 2016-07-03 DIAGNOSIS — N189 Chronic kidney disease, unspecified: Secondary | ICD-10-CM | POA: Diagnosis not present

## 2016-07-03 DIAGNOSIS — R000 Tachycardia, unspecified: Secondary | ICD-10-CM

## 2016-07-03 DIAGNOSIS — D6481 Anemia due to antineoplastic chemotherapy: Secondary | ICD-10-CM | POA: Diagnosis not present

## 2016-07-03 DIAGNOSIS — Z5111 Encounter for antineoplastic chemotherapy: Secondary | ICD-10-CM | POA: Diagnosis not present

## 2016-07-03 LAB — CBC WITH DIFFERENTIAL/PLATELET
BASO%: 2.2 % — AB (ref 0.0–2.0)
BASOS ABS: 0.1 10*3/uL (ref 0.0–0.1)
EOS%: 2.7 % (ref 0.0–7.0)
Eosinophils Absolute: 0.1 10*3/uL (ref 0.0–0.5)
HEMATOCRIT: 24 % — AB (ref 34.8–46.6)
HEMOGLOBIN: 7.9 g/dL — AB (ref 11.6–15.9)
LYMPH#: 0.9 10*3/uL (ref 0.9–3.3)
LYMPH%: 23.5 % (ref 14.0–49.7)
MCH: 31 pg (ref 25.1–34.0)
MCHC: 32.9 g/dL (ref 31.5–36.0)
MCV: 94.1 fL (ref 79.5–101.0)
MONO#: 0.4 10*3/uL (ref 0.1–0.9)
MONO%: 10 % (ref 0.0–14.0)
NEUT#: 2.3 10*3/uL (ref 1.5–6.5)
NEUT%: 61.6 % (ref 38.4–76.8)
Platelets: 160 10*3/uL (ref 145–400)
RBC: 2.55 10*6/uL — AB (ref 3.70–5.45)
RDW: 20.6 % — AB (ref 11.2–14.5)
WBC: 3.7 10*3/uL — ABNORMAL LOW (ref 3.9–10.3)

## 2016-07-03 LAB — COMPREHENSIVE METABOLIC PANEL
ALT: 38 U/L (ref 0–55)
AST: 33 U/L (ref 5–34)
Albumin: 3.7 g/dL (ref 3.5–5.0)
Alkaline Phosphatase: 115 U/L (ref 40–150)
Anion Gap: 10 mEq/L (ref 3–11)
BUN: 29 mg/dL — AB (ref 7.0–26.0)
CHLORIDE: 107 meq/L (ref 98–109)
CO2: 24 meq/L (ref 22–29)
CREATININE: 1.8 mg/dL — AB (ref 0.6–1.1)
Calcium: 10.1 mg/dL (ref 8.4–10.4)
EGFR: 27 mL/min/{1.73_m2} — ABNORMAL LOW (ref >90)
GLUCOSE: 194 mg/dL — AB (ref 70–140)
POTASSIUM: 4.2 meq/L (ref 3.5–5.1)
SODIUM: 142 meq/L (ref 136–145)
Total Bilirubin: 0.46 mg/dL (ref 0.20–1.20)
Total Protein: 6.4 g/dL (ref 6.4–8.3)

## 2016-07-03 LAB — TECHNOLOGIST REVIEW

## 2016-07-03 MED ORDER — PROCHLORPERAZINE MALEATE 10 MG PO TABS
10.0000 mg | ORAL_TABLET | Freq: Once | ORAL | Status: AC
  Administered 2016-07-03: 10 mg via ORAL

## 2016-07-03 MED ORDER — PROCHLORPERAZINE MALEATE 10 MG PO TABS
ORAL_TABLET | ORAL | Status: AC
  Filled 2016-07-03: qty 1

## 2016-07-03 MED ORDER — SODIUM CHLORIDE 0.9 % IV SOLN
4.0000 mg | Freq: Once | INTRAVENOUS | Status: AC
  Administered 2016-07-03: 4 mg via INTRAVENOUS
  Filled 2016-07-03: qty 0.4

## 2016-07-03 MED ORDER — SODIUM CHLORIDE 0.9% FLUSH
10.0000 mL | INTRAVENOUS | Status: DC | PRN
  Administered 2016-07-03: 10 mL
  Filled 2016-07-03: qty 10

## 2016-07-03 MED ORDER — PACLITAXEL PROTEIN-BOUND CHEMO INJECTION 100 MG
65.0000 mg/m2 | Freq: Once | INTRAVENOUS | Status: AC
  Administered 2016-07-03: 125 mg via INTRAVENOUS
  Filled 2016-07-03: qty 25

## 2016-07-03 MED ORDER — HEPARIN SOD (PORK) LOCK FLUSH 100 UNIT/ML IV SOLN
500.0000 [IU] | Freq: Once | INTRAVENOUS | Status: AC | PRN
  Administered 2016-07-03: 500 [IU]
  Filled 2016-07-03: qty 5

## 2016-07-03 MED ORDER — SODIUM CHLORIDE 0.9 % IV SOLN
Freq: Once | INTRAVENOUS | Status: AC
  Administered 2016-07-03: 12:00:00 via INTRAVENOUS

## 2016-07-03 NOTE — Telephone Encounter (Signed)
per pof to sch pt appt-gave pt copy of avs °

## 2016-07-03 NOTE — Progress Notes (Signed)
Dr Lindi Adie gave verbal permission OK to treat today with Creat 1.8.

## 2016-07-03 NOTE — Progress Notes (Signed)
Patient Care Team: [name], MD as PCP - General Macarthur Critchley, OD as Referring Physician (Optometry) [name], MD as Consulting Physician (Dermatology) Latanya Maudlin, MD as Consulting Physician (Orthopedic Surgery) Inda Castle, MD as Consulting Physician (Gastroenterology)  DIAGNOSIS: Breast cancer of upper-inner quadrant of left female breast [facility])   Staging form: Breast, AJCC 7th Edition   - Clinical stage from 03/05/2016: Stage IIA (T2, N0, M0) - Unsigned   - Pathologic: Stage IIB (T3, N0, cM0) - Signed by [name], MD on 03/27/2016  SUMMARY OF ONCOLOGIC HISTORY:   Breast cancer of upper-inner quadrant of left female breast ([place])   02/26/2016 Initial Diagnosis    Left breast biopsy 11:00 position: invasive ductal carcinoma with DCIS, ER 90%, PR 10%, HER-2 negative, Ki-67 30%, grade 2, 2.2 cm palpable lesion T2 N0 stage II a clinical stage     03/18/2016 Surgery    Left lumpectomy: Invasive ductal carcinoma, grade 2, 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, with high-grade DCIS, margins negative, 0/4 lymph nodes negative, ER 90%, via 10%, HER-2 negative ratio 0.97, Ki-67 30%, T3 N0 stage IIB     03/25/2016 Procedure    Genetic testing is negative for pathogenic mutations within any of the 20 Genes on the breast/ovarian cancer panel     04/04/2016 Oncotype testing    Oncotype DX recurrence score 37, 25% 10 year distant risk of recurrence     04/17/2016 -  Chemotherapy    Adjuvant chemotherapy with dose dense Adriamycin and Cytoxan followed by Abraxane weekly 12      CHIEF COMPLIANT: Cycle 4 Abraxane, fixed drug eruption on hands and legs  INTERVAL HISTORY: Kayla Baker is a 80 year old with above-mentioned history of left breast cancer treated with lumpectomy and is currently in adjuvant chemotherapy and today's cycle 4 of Abraxane. She had done fairly well from the chemotherapy standpoint except for a rash that started on her arms as couple of macular lesions which darkened in color over time to now  quite extensively on her entire arms and legs. It is not associated with itching or any discomfort. She has seen her dermatologist who did not have any additional thoughts about this.  REVIEW OF SYSTEMS:   Constitutional: Denies fevers, chills or abnormal weight loss Eyes: Denies blurriness of vision Ears, nose, mouth, throat, and face: Denies mucositis or sore throat Respiratory: Denies cough, dyspnea or wheezes Cardiovascular: Denies palpitation, chest discomfort Gastrointestinal:  Denies nausea, heartburn or change in bowel habits Skin: Macular rash on her arms and legs Lymphatics: Denies new lymphadenopathy or easy bruising Neurological:Denies numbness, tingling or new weaknesses Behavioral/Psych: Mood is stable, no new changes  Extremities: No lower extremity edema  All other systems were reviewed with the patient and are negative.  I have reviewed the past medical history, past surgical history, social history and family history with the patient and they are unchanged from previous note.  ALLERGIES:  is allergic to keflex [cephalexin]; meloxicam; prednisone; premarin [estrogens conjugated]; and trovan [alatrofloxacin].  MEDICATIONS:  Current Outpatient Prescriptions  Medication Sig Dispense Refill  . allopurinol (ZYLOPRIM) 300 MG tablet TAKE 1 TABLET BY MOUTH EVERY DAY 90 tablet 1  . amitriptyline (ELAVIL) 25 MG tablet Take 1 to 2 tablets at bedtime for neuritis 180 tablet 1  . aspirin 81 MG tablet Take 81 mg by mouth daily.    Marland Kitchen atorvastatin (LIPITOR) 40 MG tablet TAKE 1 TABLET BY MOUTH EVERY DAY 90 tablet 1  . chlorhexidine (HIBICLENS) 4 % external liquid Apply 15  mLs (1 application total) topically once. (Patient not taking: Reported on 04/16/2016) 120 mL 0  . enalapril (VASOTEC) 20 MG tablet TAKE 1 TABLET BY MOUTH DAILY 90 tablet 0  . FREESTYLE LITE test strip USE ONE STRIP TO CHECK GLUCOSE ONCE DAILY. 100 each 0  . gabapentin (NEURONTIN) 100 MG capsule Take 1 to 3 capsules at  bedtime for neuritis 90 capsule 5  . hydrochlorothiazide (HYDRODIURIL) 25 MG tablet TAKE 1 TABLET BY MOUTH DAILY 90 tablet 2  . lactated ringers infusion Inject 125 mLs into the vein continuous. (Patient not taking: Reported on 04/16/2016) 250 mL 0  . LORazepam (ATIVAN) 0.5 MG tablet Take 0.5 mg by mouth 3 times/day as needed-between meals & bedtime for anxiety or sleep.    . ranitidine (ZANTAC) 300 MG tablet Take twice a day as needed for Heartburn & Acid Indigestion 60 tablet 3  . vitamin C (ASCORBIC ACID) 500 MG tablet Take 500 mg by mouth daily. Takes 2 tablets daily     No current facility-administered medications for this visit.     PHYSICAL EXAMINATION: ECOG PERFORMANCE STATUS: 1 - Symptomatic but completely ambulatory  Vitals:   07/03/16 1045  BP: 97/60  Pulse: (!) 107  Resp: 18  Temp: 98 F (36.7 C)   Filed Weights   07/03/16 1045  Weight: 156 lb 3.2 oz (70.9 kg)    GENERAL:alert, no distress and comfortable SKIN: skin color, texture, turgor are normal, no rashes or significant lesions EYES: normal, Conjunctiva are pink and non-injected, sclera clear OROPHARYNX:no exudate, no erythema and lips, buccal mucosa, and tongue normal  NECK: supple, thyroid normal size, non-tender, without nodularity LYMPH:  no palpable lymphadenopathy in the cervical, axillary or inguinal LUNGS: clear to auscultation and percussion with normal breathing effort HEART: regular rate & rhythm and no murmurs and no lower extremity edema ABDOMEN:abdomen soft, non-tender and normal bowel sounds MUSCULOSKELETAL:no cyanosis of digits and no clubbing  NEURO: alert & oriented x 3 with fluent speech, no focal motor/sensory deficits EXTREMITIES: No lower extremity edema  LABORATORY DATA:  I have reviewed the data as listed   Chemistry      Component Value Date/Time   NA 142 07/03/2016 1001   K 4.2 07/03/2016 1001   CL 103 04/02/2016 0940   CO2 24 07/03/2016 1001   BUN 29.0 (H) 07/03/2016 1001    CREATININE 1.8 (H) 07/03/2016 1001      Component Value Date/Time   CALCIUM 10.1 07/03/2016 1001   ALKPHOS 115 07/03/2016 1001   AST 33 07/03/2016 1001   ALT 38 07/03/2016 1001   BILITOT 0.46 07/03/2016 1001       Lab Results  Component Value Date   WBC 3.7 (L) 07/03/2016   HGB 7.9 (L) 07/03/2016   HCT 24.0 (L) 07/03/2016   MCV 94.1 07/03/2016   PLT 160 07/03/2016   NEUTROABS 2.3 07/03/2016     ASSESSMENT & PLAN:  Breast cancer of upper-inner quadrant of left female breast ([place]) Left lumpectomy 03/08/2016: Invasive ductal carcinoma, grade 2, 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 Based on this, I strongly recommend systemic chemotherapy.  Treatment plan: 1. Adjuvant chemotherapy with dose dense Adriamycin and Cytoxan 4 followed by Abraxane weekly 12 2. Adjuvant radiation therapy followed by  3. Adjuvant antiestrogen therapy ---------------------------------------------------------------------------------------------- Current treatment: completed 4 cycles of dose dense Adriamycin and Cytoxan (started at a slightly lower dose given her age), Today is Abrance cycle 4/12 Echocardiogram revealed EF of 60-65% on 04/16/2016  Chemotherapy toxicities: 1. Fatigue due to chemotherapy  2. Elevated ALT: Not due to chemotherapy, we will continue to watch and monitor this 3. Chemotherapy-induced anemia: Hemoglobin 7.9. Currently on oral iron supplementation twice a day 4. Lower extremity edema: Because of renal dysfunction, I encouraged her to stop Lasix. 5. Tachycardia: Probably related to anemia. 6. Skin rash: Fixed drug eruption: Macular rash which later turned darker in color. This is on her hands and legs. I decreased the dosage of Abraxane from  cycle 4. I added dexamethasone to her treatment regimen. 7. Chronic kidney disease: Creatinine is up to 1.8. I instructed her to discontinue hydrochlorothiazide. It is okay to treat with a creatinine of 1.8 today. We will check on her blood pressure next week when she returns.  I would like to see her back next week to assess the rash.   No orders of the defined types were placed in this encounter.  The patient has a good understanding of the overall plan. she agrees with it. she will call with any problems that may develop before the next visit here.   Rulon Eisenmenger, MD 07/03/16

## 2016-07-03 NOTE — Patient Instructions (Signed)
[facility] Discharge Instructions for Patients Receiving Chemotherapy  Today you received the following chemotherapy agents Abraxane  To help prevent nausea and vomiting after your treatment, we encourage you to take your nausea medication    If you develop nausea and vomiting that is not controlled by your nausea medication, call the clinic.   BELOW ARE SYMPTOMS THAT SHOULD BE REPORTED IMMEDIATELY:  *FEVER GREATER THAN 100.5 F  *CHILLS WITH OR WITHOUT FEVER  NAUSEA AND VOMITING THAT IS NOT CONTROLLED WITH YOUR NAUSEA MEDICATION  *UNUSUAL SHORTNESS OF BREATH  *UNUSUAL BRUISING OR BLEEDING  TENDERNESS IN MOUTH AND THROAT WITH OR WITHOUT PRESENCE OF ULCERS  *URINARY PROBLEMS  *BOWEL PROBLEMS  UNUSUAL RASH Items with * indicate a potential emergency and should be followed up as soon as possible.  Feel free to call the clinic you have any questions or concerns. The clinic phone number is [phone].  Please show the [place] at check-in to the Emergency Department and triage nurse.

## 2016-07-07 ENCOUNTER — Encounter: Payer: Self-pay | Admitting: Internal Medicine

## 2016-07-07 NOTE — Progress Notes (Signed)
ADULT & ADOLESCENT INTERNAL MEDICINE                       [name], M.D.        Uvaldo Bristle. Silverio Lay, P.A.-C       Starlyn Skeans, P.A.-C   [facility]                [address] [place], [place] [address] Telephone [phone] Telefax [phone] ______________________________________________________________________     This very nice 80 y.o.MWF presents for 6 month follow up with Hypertension, Hyperlipidemia, Pre-Diabetes and Vitamin D Deficiency. Patient recently underwent Lt Breast lumpectomy for Ca in Apr 2017 and in May 2017 was started on Adjuvant Chemotx with Adriamycin & Cytoxin followed by weekly Abraxane x 12 weeks.  Patient's GERD is controlled with her Ranitidine.  Also her Gout seems controlled with her Allopurinol.       Patient is treated for HTN circa 2000 & BP has been controlled at home. Today's BP is 108/56 . Patient has had no complaints of any cardiac type chest pain, palpitations, dyspnea/orthopnea/PND, dizziness, claudication, or dependent edema.     Hyperlipidemia is controlled with diet & meds. Patient denies myalgias or other med SE's. Last Lipids were at goal with elevated Trig's: Lab Results  Component Value Date   CHOL 165 04/02/2016   HDL 47 04/02/2016   LDLCALC 65 04/02/2016   TRIG 267 (H) 04/02/2016   CHOLHDL 3.5 04/02/2016      Also, the patient has history of T2_NIDDM since 2010 with A1c 6.0% and she's been attempting dietary control  and  she also has consequent CKD2 & painful peripheral sensory neuropathy of the R>L foot with intol to Lyrica.   Amitriptyline & Gabapentin seem to be controlling her neuritis pains to a tolerable level and she has had no symptoms of reactive hypoglycemia, diabetic polys or visual blurring.  Last A1c was not at goal:  Lab Results  Component Value Date   HGBA1C 6.6 (H) 04/02/2016      Further, the patient also has history of Vitamin D Deficiency of "33"  in 2008 and supplements vitamin D without any suspected side-effects. Last vitamin D was   Lab Results  Component Value Date   VD25OH 64 12/21/2015   Current Outpatient Prescriptions on File Prior to Visit  Medication Sig  . allopurinol (ZYLOPRIM) 300 MG tablet TAKE 1 TABLET BY MOUTH EVERY DAY  . amitriptyline (ELAVIL) 25 MG tablet Take 1 to 2 tablets at bedtime for neuritis  . aspirin 81 MG tablet Take 81 mg by mouth daily.  Marland Kitchen atorvastatin (LIPITOR) 40 MG tablet TAKE 1 TABLET BY MOUTH EVERY DAY  . chlorhexidine (HIBICLENS) 4 % external liquid Apply 15 mLs (1 application total) topically once. (Patient not taking: Reported on 04/16/2016)  . enalapril (VASOTEC) 20 MG tablet TAKE 1 TABLET BY MOUTH DAILY  . FREESTYLE LITE test strip USE ONE STRIP TO CHECK GLUCOSE ONCE DAILY.  Marland Kitchen gabapentin (NEURONTIN) 100 MG capsule Take 1 to 3 capsules at bedtime for neuritis  . lactated ringers infusion Inject 125 mLs into the vein continuous. (Patient not taking: Reported on 04/16/2016)  . LORazepam (ATIVAN) 0.5 MG tablet Take 0.5 mg by mouth 3 times/day as needed-between meals & bedtime for anxiety or sleep.  . ranitidine (ZANTAC) 300 MG tablet Take twice a day as needed for  Heartburn & Acid Indigestion  . vitamin C (ASCORBIC ACID) 500 MG tablet Take 500 mg by mouth daily. Takes 2 tablets daily   No current facility-administered medications on file prior to visit.    Allergies  Allergen Reactions  . Keflex [Cephalexin] Swelling    Tongue and throat swells  . Meloxicam Swelling  . Prednisone Swelling    Can Not take High doses  . Premarin [Estrogens Conjugated] Hives  . Trovan [Alatrofloxacin] Other (See Comments)    Unknown reaction   PMHx:   Past Medical History:  Diagnosis Date  . Allergy   . Anemia   . Arthritis   . Breast cancer of upper-inner quadrant of left female breast ([place]) 02/29/2016   skin- 2016- squamous- on right upper arm  . Diet-controlled type 2 diabetes mellitus ([place])    diet  and exercsise,Has never been on meds  . Diverticulosis   . GERD (gastroesophageal reflux disease)   . Gout    takes Allopurinol daily  . History of colon polyps    benign  . History of shingles   . Hyperlipidemia    takes Atorvastatin daily  . Hypertension    takes Enalapril and HCTZ daily  . Peripheral neuropathy (HCC)    takes Gabapentin and Elavil daily  . PONV (postoperative nausea and vomiting)   . Vitamin D deficiency    Immunization History  Administered Date(s) Administered  . DTaP 07/01/2004  . Influenza Whole 08/15/2013  . Influenza, High Dose Seasonal PF 08/28/2014, 09/20/2015  . Pneumococcal Conjugate-13 09/20/2015  . Pneumococcal Polysaccharide-23 11/01/2009  . Td 08/10/2009  . Zoster 08/02/2011   Past Surgical History:  Procedure Laterality Date  . ABDOMINAL HYSTERECTOMY  1973  . CARDIAC CATHETERIZATION    . CATARACT EXTRACTION, BILATERAL  2014   right eye 2/14; left eye 3/3  . COLONOSCOPY    . HAND SURGERY Left 2012  . INCONTINENCE SURGERY  2006  . KNEE ARTHROSCOPY Right   . PORTACATH PLACEMENT N/A 04/14/2016   Procedure: INSERTION PORT-A-CATH ;  Surgeon: Fanny Skates, MD;  Location: [facility];  Service: General;  Laterality: N/A;  . RADIOACTIVE SEED GUIDED MASTECTOMY WITH AXILLARY SENTINEL LYMPH NODE BIOPSY Left 03/18/2016   Procedure: RADIOACTIVE SEED GUIDED LEFT PARTIAL MASTECTOMY WITH AXILLARY SENTINEL LYMPH NODE BIOPSY AND BLUE DYE INJECTION;  Surgeon: Fanny Skates, MD;  Location: [facility];  Service: General;  Laterality: Left;   FHx:    Reviewed / unchanged  SHx:    Reviewed / unchanged  Systems Review:  Constitutional: Denies fever, chills, wt changes, headaches, insomnia, fatigue, night sweats, change in appetite. Eyes: Denies redness, blurred vision, diplopia, discharge, itchy, watery eyes.  ENT: Denies discharge, congestion, post nasal drip, epistaxis, sore throat, earache, hearing loss, dental pain, tinnitus, vertigo, sinus pain, snoring.  CV:  Denies chest pain, palpitations, irregular heartbeat, syncope, dyspnea, diaphoresis, orthopnea, PND, claudication or edema. Respiratory: denies cough, dyspnea, DOE, pleurisy, hoarseness, laryngitis, wheezing.  Gastrointestinal: Denies dysphagia, odynophagia, heartburn, reflux, water brash, abdominal pain or cramps, nausea, vomiting, bloating, diarrhea, constipation, hematemesis, melena, hematochezia  or hemorrhoids. Genitourinary: Denies dysuria, frequency, urgency, nocturia, hesitancy, discharge, hematuria or flank pain. Musculoskeletal: Denies arthralgias, myalgias, stiffness, jt. swelling, pain, limping or strain/sprain.  Skin: Denies pruritus, rash, hives, warts, acne, eczema or change in skin lesion(s). Neuro: No weakness, tremor, incoordination, spasms, paresthesia or pain. Psychiatric: Denies confusion, memory loss or sensory loss. Endo: Denies change in weight, skin or hair change.  Heme/Lymph: No excessive bleeding, bruising or enlarged lymph  nodes.  Physical Exam  BP (!) 108/56   Pulse (!) 104   Temp 97.4 F (36.3 C)   Resp 16   Ht 5' 4"  (1.626 m)   Wt 156 lb 9.6 oz (71 kg)   BMI 26.88 kg/m   Appears well nourished and in no distress. Scalp covered with bandana Eyes: PERRLA, EOMs, conjunctiva no swelling or erythema. Sinuses: No frontal/maxillary tenderness ENT/Mouth: EAC's clear, TM's nl w/o erythema, bulging. Nares clear w/o erythema, swelling, exudates. Oropharynx clear without erythema or exudates. Oral hygiene is good. Tongue normal, non obstructing. Hearing intact.  Neck: Supple. Thyroid nl. Car 2+/2+ without bruits, nodes or JVD. Chest: Respirations nl with BS clear & equal w/o rales, rhonchi, wheezing or stridor.  Cor: Heart sounds normal w/ regular rate and rhythm without sig. murmurs, gallops, clicks, or rubs. Peripheral pulses normal and equal  without edema.  Abdomen: Soft & bowel sounds normal. Non-tender w/o guarding, rebound, hernias, masses, or organomegaly.   Lymphatics: Unremarkable.  Musculoskeletal: Full ROM all peripheral extremities, joint stability, 5/5 strength, and normal gait.  Skin: Warm, dry without exposed rashes, lesions or ecchymosis apparent.  Neuro: Cranial nerves intact, reflexes equal bilaterally. Sensory-motor testing grossly intact. Tendon reflexes grossly intact.  Pysch: Alert & oriented x 3.  Insight and judgement nl & appropriate. No ideations.  Assessment and Plan:  1. Essential hypertension  - Continue medication, monitor blood pressure at home. Continue DASH diet. Reminder to go to the ER if any CP, SOB, nausea, dizziness, severe HA, changes vision/speech, left arm numbness and tingling and jaw pain. - TSH  2. Hyperlipidemia  - Continue diet/meds, exercise,& lifestyle modifications. Continue monitor periodic cholesterol/liver & renal functions  - Lipid panel - TSH  3. Type 2 diabetes mellitus with stage 3 chronic kidney disease, without long-term current use of insulin (HCC)  - Continue diet, exercise, lifestyle modifications. Monitor appropriate labs. - Hemoglobin A1c - Insulin, random  4. Vitamin D deficiency  - Continue supplementation. - VITAMIN D 25 Hydroxy  5. Diabetic neuropathy, painful ([place])   6. Idiopathic gout   7. Breast cancer of upper-inner quadrant of left female breast ([place])   8. Medication management  - CBC with Differential/Platelet - Magnesium   Recommended regular exercise, BP monitoring, weight control, and discussed med and SE's. Recommended labs to assess and monitor clinical status. Further disposition pending results of labs. Over 30 minutes of exam, counseling, chart review was performed

## 2016-07-07 NOTE — Patient Instructions (Addendum)

## 2016-07-08 ENCOUNTER — Encounter: Payer: Self-pay | Admitting: Internal Medicine

## 2016-07-08 ENCOUNTER — Ambulatory Visit (INDEPENDENT_AMBULATORY_CARE_PROVIDER_SITE_OTHER): Payer: PPO | Admitting: Internal Medicine

## 2016-07-08 VITALS — BP 108/56 | HR 104 | Temp 97.4°F | Resp 16 | Ht 64.0 in | Wt 156.6 lb

## 2016-07-08 DIAGNOSIS — I10 Essential (primary) hypertension: Secondary | ICD-10-CM

## 2016-07-08 DIAGNOSIS — N183 Chronic kidney disease, stage 3 unspecified: Secondary | ICD-10-CM

## 2016-07-08 DIAGNOSIS — Z79899 Other long term (current) drug therapy: Secondary | ICD-10-CM

## 2016-07-08 DIAGNOSIS — C50212 Malignant neoplasm of upper-inner quadrant of left female breast: Secondary | ICD-10-CM

## 2016-07-08 DIAGNOSIS — E782 Mixed hyperlipidemia: Secondary | ICD-10-CM

## 2016-07-08 DIAGNOSIS — E1122 Type 2 diabetes mellitus with diabetic chronic kidney disease: Secondary | ICD-10-CM | POA: Diagnosis not present

## 2016-07-08 DIAGNOSIS — E559 Vitamin D deficiency, unspecified: Secondary | ICD-10-CM

## 2016-07-08 DIAGNOSIS — E1140 Type 2 diabetes mellitus with diabetic neuropathy, unspecified: Secondary | ICD-10-CM

## 2016-07-08 DIAGNOSIS — M1000 Idiopathic gout, unspecified site: Secondary | ICD-10-CM

## 2016-07-08 LAB — LIPID PANEL
Cholesterol: 135 mg/dL (ref 125–200)
HDL: 47 mg/dL (ref >=46)
LDL Cholesterol: 56 mg/dL (ref <130)
Total CHOL/HDL Ratio: 2.9 ratio (ref <=5.0)
Triglycerides: 161 mg/dL — ABNORMAL HIGH (ref <150)
VLDL: 32 mg/dL — ABNORMAL HIGH (ref <30)

## 2016-07-08 LAB — CBC WITH DIFFERENTIAL/PLATELET
BASOS ABS: 0 {cells}/uL (ref 0–200)
BASOS PCT: 0 %
EOS ABS: 52 {cells}/uL (ref 15–500)
Eosinophils Relative: 1 %
HCT: 25 % — ABNORMAL LOW (ref 35.0–45.0)
HEMOGLOBIN: 8.1 g/dL — AB (ref 11.7–15.5)
LYMPHS ABS: 728 {cells}/uL — AB (ref 850–3900)
Lymphocytes Relative: 14 %
MCH: 30.9 pg (ref 27.0–33.0)
MCHC: 32.4 g/dL (ref 32.0–36.0)
MCV: 95.4 fL (ref 80.0–100.0)
MONO ABS: 208 {cells}/uL (ref 200–950)
MONOS PCT: 4 %
MPV: 9.4 fL (ref 7.5–12.5)
NEUTROS ABS: 4212 {cells}/uL (ref 1500–7800)
Neutrophils Relative %: 81 %
PLATELETS: 190 10*3/uL (ref 140–400)
RBC: 2.62 MIL/uL — ABNORMAL LOW (ref 3.80–5.10)
RDW: 21.4 % — ABNORMAL HIGH (ref 11.0–15.0)
WBC: 5.2 10*3/uL (ref 3.8–10.8)

## 2016-07-08 LAB — MAGNESIUM: Magnesium: 1.3 mg/dL — ABNORMAL LOW (ref 1.5–2.5)

## 2016-07-08 LAB — TSH: TSH: 3.03 m[IU]/L

## 2016-07-09 LAB — HEMOGLOBIN A1C
Hgb A1c MFr Bld: 6.9 % — ABNORMAL HIGH (ref <5.7)
Mean Plasma Glucose: 151 mg/dL

## 2016-07-09 LAB — INSULIN, RANDOM: INSULIN: 463.2 u[IU]/mL — AB (ref 2.0–19.6)

## 2016-07-09 LAB — VITAMIN D 25 HYDROXY (VIT D DEFICIENCY, FRACTURES): Vit D, 25-Hydroxy: 41 ng/mL (ref 30–100)

## 2016-07-10 ENCOUNTER — Other Ambulatory Visit: Payer: Self-pay | Admitting: *Deleted

## 2016-07-10 ENCOUNTER — Other Ambulatory Visit (HOSPITAL_BASED_OUTPATIENT_CLINIC_OR_DEPARTMENT_OTHER): Payer: PPO

## 2016-07-10 ENCOUNTER — Ambulatory Visit (HOSPITAL_BASED_OUTPATIENT_CLINIC_OR_DEPARTMENT_OTHER): Payer: PPO | Admitting: Hematology and Oncology

## 2016-07-10 ENCOUNTER — Other Ambulatory Visit: Payer: PPO

## 2016-07-10 ENCOUNTER — Ambulatory Visit (HOSPITAL_BASED_OUTPATIENT_CLINIC_OR_DEPARTMENT_OTHER): Payer: PPO

## 2016-07-10 ENCOUNTER — Encounter: Payer: Self-pay | Admitting: Hematology and Oncology

## 2016-07-10 DIAGNOSIS — N189 Chronic kidney disease, unspecified: Secondary | ICD-10-CM | POA: Diagnosis not present

## 2016-07-10 DIAGNOSIS — Z170 Estrogen receptor positive status [ER+]: Secondary | ICD-10-CM | POA: Diagnosis not present

## 2016-07-10 DIAGNOSIS — Z5111 Encounter for antineoplastic chemotherapy: Secondary | ICD-10-CM | POA: Diagnosis not present

## 2016-07-10 DIAGNOSIS — C50212 Malignant neoplasm of upper-inner quadrant of left female breast: Secondary | ICD-10-CM | POA: Diagnosis not present

## 2016-07-10 DIAGNOSIS — D6481 Anemia due to antineoplastic chemotherapy: Secondary | ICD-10-CM

## 2016-07-10 LAB — CBC WITH DIFFERENTIAL/PLATELET
BASO%: 0.9 % (ref 0.0–2.0)
Basophils Absolute: 0 10*3/uL (ref 0.0–0.1)
EOS ABS: 0.1 10*3/uL (ref 0.0–0.5)
EOS%: 1.7 % (ref 0.0–7.0)
HEMATOCRIT: 24.3 % — AB (ref 34.8–46.6)
HEMOGLOBIN: 8 g/dL — AB (ref 11.6–15.9)
LYMPH#: 1 10*3/uL (ref 0.9–3.3)
LYMPH%: 22.1 % (ref 14.0–49.7)
MCH: 31.7 pg (ref 25.1–34.0)
MCHC: 32.9 g/dL (ref 31.5–36.0)
MCV: 96.4 fL (ref 79.5–101.0)
MONO#: 0.3 10*3/uL (ref 0.1–0.9)
MONO%: 6.6 % (ref 0.0–14.0)
NEUT%: 68.7 % (ref 38.4–76.8)
NEUTROS ABS: 3.2 10*3/uL (ref 1.5–6.5)
PLATELETS: 148 10*3/uL (ref 145–400)
RBC: 2.52 10*6/uL — AB (ref 3.70–5.45)
RDW: 21.1 % — AB (ref 11.2–14.5)
WBC: 4.7 10*3/uL (ref 3.9–10.3)

## 2016-07-10 LAB — COMPREHENSIVE METABOLIC PANEL
ALBUMIN: 3.6 g/dL (ref 3.5–5.0)
ALK PHOS: 101 U/L (ref 40–150)
ALT: 58 U/L — AB (ref 0–55)
ANION GAP: 9 meq/L (ref 3–11)
AST: 45 U/L — ABNORMAL HIGH (ref 5–34)
BILIRUBIN TOTAL: 0.52 mg/dL (ref 0.20–1.20)
BUN: 23 mg/dL (ref 7.0–26.0)
CALCIUM: 9.9 mg/dL (ref 8.4–10.4)
CO2: 24 meq/L (ref 22–29)
CREATININE: 1.5 mg/dL — AB (ref 0.6–1.1)
Chloride: 109 mEq/L (ref 98–109)
EGFR: 34 mL/min/{1.73_m2} — ABNORMAL LOW (ref >90)
Glucose: 166 mg/dl — ABNORMAL HIGH (ref 70–140)
Potassium: 4.3 mEq/L (ref 3.5–5.1)
Sodium: 142 mEq/L (ref 136–145)
TOTAL PROTEIN: 6.3 g/dL — AB (ref 6.4–8.3)

## 2016-07-10 LAB — TECHNOLOGIST REVIEW

## 2016-07-10 MED ORDER — SODIUM CHLORIDE 0.9% FLUSH
10.0000 mL | INTRAVENOUS | Status: DC | PRN
  Administered 2016-07-10: 10 mL
  Filled 2016-07-10: qty 10

## 2016-07-10 MED ORDER — METOPROLOL SUCCINATE ER 25 MG PO TB24: 25.0000 mg | ORAL_TABLET | Freq: Every day | ORAL | 3 refills | Status: DC

## 2016-07-10 MED ORDER — PROCHLORPERAZINE MALEATE 10 MG PO TABS
ORAL_TABLET | ORAL | Status: AC
  Filled 2016-07-10: qty 1

## 2016-07-10 MED ORDER — PROCHLORPERAZINE MALEATE 10 MG PO TABS
10.0000 mg | ORAL_TABLET | Freq: Once | ORAL | Status: AC
  Administered 2016-07-10: 10 mg via ORAL

## 2016-07-10 MED ORDER — PACLITAXEL PROTEIN-BOUND CHEMO INJECTION 100 MG
65.0000 mg/m2 | Freq: Once | INTRAVENOUS | Status: AC
  Administered 2016-07-10: 125 mg via INTRAVENOUS
  Filled 2016-07-10: qty 25

## 2016-07-10 MED ORDER — SODIUM CHLORIDE 0.9 % IV SOLN
Freq: Once | INTRAVENOUS | Status: AC
  Administered 2016-07-10: 10:00:00 via INTRAVENOUS

## 2016-07-10 MED ORDER — HEPARIN SOD (PORK) LOCK FLUSH 100 UNIT/ML IV SOLN
500.0000 [IU] | Freq: Once | INTRAVENOUS | Status: AC | PRN
  Administered 2016-07-10: 500 [IU]
  Filled 2016-07-10: qty 5

## 2016-07-10 MED ORDER — FREESTYLE LANCETS MISC: 3 refills | Status: DC

## 2016-07-10 MED ORDER — GLUCOSE BLOOD VI STRP: ORAL_STRIP | 3 refills | Status: DC

## 2016-07-10 NOTE — Patient Instructions (Signed)
[facility] Discharge Instructions for Patients Receiving Chemotherapy  Today you received the following chemotherapy agents Abraxane  To help prevent nausea and vomiting after your treatment, we encourage you to take your nausea medication    If you develop nausea and vomiting that is not controlled by your nausea medication, call the clinic.   BELOW ARE SYMPTOMS THAT SHOULD BE REPORTED IMMEDIATELY:  *FEVER GREATER THAN 100.5 F  *CHILLS WITH OR WITHOUT FEVER  NAUSEA AND VOMITING THAT IS NOT CONTROLLED WITH YOUR NAUSEA MEDICATION  *UNUSUAL SHORTNESS OF BREATH  *UNUSUAL BRUISING OR BLEEDING  TENDERNESS IN MOUTH AND THROAT WITH OR WITHOUT PRESENCE OF ULCERS  *URINARY PROBLEMS  *BOWEL PROBLEMS  UNUSUAL RASH Items with * indicate a potential emergency and should be followed up as soon as possible.  Feel free to call the clinic you have any questions or concerns. The clinic phone number is [phone].  Please show the [place] at check-in to the Emergency Department and triage nurse.

## 2016-07-10 NOTE — Progress Notes (Signed)
No steroids today per [name].

## 2016-07-10 NOTE — Progress Notes (Signed)
Patient Care Team: [name], MD as PCP - General Macarthur Critchley, OD as Referring Physician (Optometry) [name], MD as Consulting Physician (Dermatology) Latanya Maudlin, MD as Consulting Physician (Orthopedic Surgery) Inda Castle, MD as Consulting Physician (Gastroenterology) [name], MD as Consulting Physician (Hematology and Oncology)  DIAGNOSIS: Breast cancer of upper-inner quadrant of left female breast [facility])   Staging form: Breast, AJCC 7th Edition   - Clinical stage from 03/05/2016: Stage IIA (T2, N0, M0) - Unsigned   - Pathologic: Stage IIB (T3, N0, cM0) - Signed by [name], MD on 03/27/2016  SUMMARY OF ONCOLOGIC HISTORY:   Breast cancer of upper-inner quadrant of left female breast ([place])   02/26/2016 Initial Diagnosis    Left breast biopsy 11:00 position: invasive ductal carcinoma with DCIS, ER 90%, PR 10%, HER-2 negative, Ki-67 30%, grade 2, 2.2 cm palpable lesion T2 N0 stage II a clinical stage     03/18/2016 Surgery    Left lumpectomy: Invasive ductal carcinoma, grade 2, 6.3 cm, with high-grade DCIS, margins negative, 0/4 lymph nodes negative, ER 90%, via 10%, HER-2 negative ratio 0.97, Ki-67 30%, T3 N0 stage IIB     03/25/2016 Procedure    Genetic testing is negative for pathogenic mutations within any of the 20 Genes on the breast/ovarian cancer panel     04/04/2016 Oncotype testing    Oncotype DX recurrence score 37, 25% 10 year distant risk of recurrence     04/17/2016 -  Chemotherapy    Adjuvant chemotherapy with dose dense Adriamycin and Cytoxan followed by Abraxane weekly 12      CHIEF COMPLIANT: Cycle 5 Abraxane  INTERVAL HISTORY: Kayla Baker is a 80 year old with above-mentioned history of left breast cancer currently on adjuvant chemotherapy and today is cycle 5 Abraxane. She had a maculopapular rash last week on her hands and extremities. When we added dexamethasone and decrease the dose if Abraxane the rash appears to have stabilized. However  since last week she has noticed redness of the tips of her toes. This is not causing her any pain or discomfort she denies even neuropathy.  REVIEW OF SYSTEMS:   Constitutional: Denies fevers, chills or abnormal weight loss Eyes: Denies blurriness of vision Ears, nose, mouth, throat, and face: Denies mucositis or sore throat Respiratory: Denies cough, dyspnea or wheezes Cardiovascular: Denies palpitation, chest discomfort Gastrointestinal:  Denies nausea, heartburn or change in bowel habits Skin: Denies abnormal skin rashes Lymphatics: Denies new lymphadenopathy or easy bruising Neurological:Denies numbness, tingling or new weaknesses Behavioral/Psych: Mood is stable, no new changes  Extremities: Tips of the toes appear to have redness  All other systems were reviewed with the patient and are negative.  I have reviewed the past medical history, past surgical history, social history and family history with the patient and they are unchanged from previous note.  ALLERGIES:  is allergic to keflex [cephalexin]; meloxicam; prednisone; premarin [estrogens conjugated]; and trovan [alatrofloxacin].  MEDICATIONS:  Current Outpatient Prescriptions  Medication Sig Dispense Refill  . allopurinol (ZYLOPRIM) 300 MG tablet TAKE 1 TABLET BY MOUTH EVERY DAY 90 tablet 1  . amitriptyline (ELAVIL) 25 MG tablet Take 1 to 2 tablets at bedtime for neuritis 180 tablet 1  . aspirin 81 MG tablet Take 81 mg by mouth daily.    Marland Kitchen atorvastatin (LIPITOR) 40 MG tablet TAKE 1 TABLET BY MOUTH EVERY DAY 90 tablet 1  . chlorhexidine (HIBICLENS) 4 % external liquid Apply 15 mLs (1 application total) topically once. (Patient not taking:  Reported on 04/16/2016) 120 mL 0  . enalapril (VASOTEC) 20 MG tablet TAKE 1 TABLET BY MOUTH DAILY 90 tablet 0  . FREESTYLE LITE test strip USE ONE STRIP TO CHECK GLUCOSE ONCE DAILY. 100 each 0  . furosemide (LASIX) 20 MG tablet     . gabapentin (NEURONTIN) 100 MG capsule Take 1 to 3  capsules at bedtime for neuritis 90 capsule 5  . lactated ringers infusion Inject 125 mLs into the vein continuous. (Patient not taking: Reported on 04/16/2016) 250 mL 0  . LORazepam (ATIVAN) 0.5 MG tablet Take 0.5 mg by mouth 3 times/day as needed-between meals & bedtime for anxiety or sleep.    . metoprolol succinate (TOPROL XL) 25 MG 24 hr tablet Take 1 tablet (25 mg total) by mouth daily. 30 tablet 3  . ondansetron (ZOFRAN) 8 MG tablet   1  . prochlorperazine (COMPAZINE) 10 MG tablet     . ranitidine (ZANTAC) 300 MG tablet Take twice a day as needed for Heartburn & Acid Indigestion 60 tablet 3  . vitamin C (ASCORBIC ACID) 500 MG tablet Take 500 mg by mouth daily. Takes 2 tablets daily     No current facility-administered medications for this visit.    Facility-Administered Medications Ordered in Other Visits  Medication Dose Route Frequency Provider Last Rate Last Dose  . heparin lock flush 100 unit/mL  500 Units Intracatheter Once PRN [name], MD      . PACLitaxel-protein bound (ABRAXANE) chemo infusion 125 mg  65 mg/m2 (Treatment Plan Recorded) Intravenous Once [name], MD      . sodium chloride flush (NS) 0.9 % injection 10 mL  10 mL Intracatheter PRN [name], MD        PHYSICAL EXAMINATION: ECOG PERFORMANCE STATUS: 1 - Symptomatic but completely ambulatory  Vitals:   07/10/16 0949  BP: 120/61  Pulse: (!) 113  Resp: 18  Temp: 98.2 F (36.8 C)   Filed Weights   07/10/16 0949  Weight: 158 lb 3.2 oz (71.8 kg)    GENERAL:alert, no distress and comfortable SKIN: skin color, texture, turgor are normal, no rashes or significant lesions EYES: normal, Conjunctiva are pink and non-injected, sclera clear OROPHARYNX:no exudate, no erythema and lips, buccal mucosa, and tongue normal  NECK: supple, thyroid normal size, non-tender, without nodularity LYMPH:  no palpable lymphadenopathy in the cervical, axillary or inguinal LUNGS: clear to auscultation and percussion with  normal breathing effort HEART: regular rate & rhythm and no murmurs and no lower extremity edema ABDOMEN:abdomen soft, non-tender and normal bowel sounds MUSCULOSKELETAL:no cyanosis of digits and no clubbing  NEURO: alert & oriented x 3 with fluent speech, no focal motor/sensory deficits EXTREMITIES: No lower extremity edema  LABORATORY DATA:  I have reviewed the data as listed   Chemistry      Component Value Date/Time   NA 142 07/10/2016 0938   K 4.3 07/10/2016 0938   CL 103 04/02/2016 0940   CO2 24 07/10/2016 0938   BUN 23.0 07/10/2016 0938   CREATININE 1.5 (H) 07/10/2016 0938      Component Value Date/Time   CALCIUM 9.9 07/10/2016 0938   ALKPHOS 101 07/10/2016 0938   AST 45 (H) 07/10/2016 0938   ALT 58 (H) 07/10/2016 0938   BILITOT 0.52 07/10/2016 0938       Lab Results  Component Value Date   WBC 4.7 07/10/2016   HGB 8.0 (L) 07/10/2016   HCT 24.3 (L) 07/10/2016   MCV 96.4 07/10/2016   PLT  148 07/10/2016   NEUTROABS 3.2 07/10/2016     ASSESSMENT & PLAN:  Breast cancer of upper-inner quadrant of left female breast ([place]) Left lumpectomy 03/08/2016: Invasive ductal carcinoma, grade 2, 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 Based on this, I strongly recommend systemic chemotherapy.  Treatment plan: 1. Adjuvant chemotherapy with dose dense Adriamycin and Cytoxan 4 followed by Abraxane weekly 12 2. Adjuvant radiation therapy followed by 3. Adjuvant antiestrogen therapy ---------------------------------------------------------------------------------------------- Current treatment: completed 4 cycles of dose dense Adriamycin and Cytoxan (started at a slightly lower dose given her age), Today is Abrance cycle 5/12 Echocardiogram  revealed EF of 60-65% on 04/16/2016  Chemotherapy toxicities: 1. Fatigue due to chemotherapy  2. Elevated ALT: Not due to chemotherapy, we will continue to watch and monitor this 3. Chemotherapy-induced anemia: Hemoglobin 7.9. Currently on oral iron supplementation twice a day 4. Lower extremity edema: Because of renal dysfunction, I encouraged her to stop Lasix. 5. Tachycardia: Probably related to anemia. I would like to switch her anti-hypertensive medication from lisinopril to Toprol-XL. I gave her a prescription of 25 mg daily on 07/10/2016. 6. Skin rash: Fixed drug eruption: Macular rash which later turned darker in color. This is on her hands and legs. I decreased the dosage of Abraxane from cycle 4. The rash did not get any worse however she developed redness at the tips of her toes. It may be that she is intolerant of dexamethasone. I will discontinue Decadron. 7. Chronic kidney disease: Creatinine is 1.5. She stopped hydrochlorothiazide and this appears to be helping.  I would like to see her back next week to assess the redness in the toes. Return to clinic in 2 weeks for follow up.   No orders of the defined types were placed in this encounter.  The patient has a good understanding of the overall plan. she agrees with it. she will call with any problems that may develop before the next visit here.   Rulon Eisenmenger, MD 07/10/16

## 2016-07-10 NOTE — Progress Notes (Signed)
Okay to treat with elevated HR per [name].

## 2016-07-10 NOTE — Assessment & Plan Note (Signed)
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 Based on this, I strongly recommend systemic chemotherapy.  Treatment plan: 1. Adjuvant chemotherapy with dose dense Adriamycin and Cytoxan 4 followed by Abraxane weekly 12 2. Adjuvant radiation therapy followed by 3. Adjuvant antiestrogen therapy ---------------------------------------------------------------------------------------------- Current treatment: completed 4 cycles of dose dense Adriamycin and Cytoxan (started at a slightly lower dose given her age), Today is Abrance cycle 5/12 Echocardiogram revealed EF of 60-65% on 04/16/2016  Chemotherapy toxicities: 1. Fatigue due to chemotherapy  2. Elevated ALT: Not due to chemotherapy, we will continue to watch and monitor this 3. Chemotherapy-induced anemia: Hemoglobin 7.9. Currently on oral iron supplementation twice a day 4. Lower extremity edema: Because of renal dysfunction, I encouraged her to stop Lasix. 5. Tachycardia: Probably related to anemia. 6. Skin rash: Fixed drug eruption: Macular rash which later turned darker in color. This is on her hands and legs. I decreased the dosage of Abraxane from cycle 4. I added dexamethasone to her treatment regimen. 7. Chronic kidney disease: Creatinine is up to 1.8. I instructed her to discontinue hydrochlorothiazide. It is okay to treat with a creatinine of 1.8 today. We will check on her blood pressure next week when she returns.  I would like to see her back next week to assess the rash. Return to clinic in 2 weeks for follow up.

## 2016-07-15 ENCOUNTER — Encounter: Payer: Self-pay | Admitting: Internal Medicine

## 2016-07-15 ENCOUNTER — Other Ambulatory Visit: Payer: Self-pay | Admitting: Internal Medicine

## 2016-07-15 MED ORDER — GLUCOSE BLOOD VI STRP: ORAL_STRIP | 3 refills | Status: DC

## 2016-07-15 MED ORDER — FREESTYLE LANCETS MISC: 3 refills | Status: DC

## 2016-07-16 ENCOUNTER — Other Ambulatory Visit: Payer: Self-pay | Admitting: *Deleted

## 2016-07-17 ENCOUNTER — Other Ambulatory Visit (HOSPITAL_BASED_OUTPATIENT_CLINIC_OR_DEPARTMENT_OTHER): Payer: PPO

## 2016-07-17 ENCOUNTER — Ambulatory Visit (HOSPITAL_BASED_OUTPATIENT_CLINIC_OR_DEPARTMENT_OTHER): Payer: PPO | Admitting: Hematology and Oncology

## 2016-07-17 ENCOUNTER — Encounter: Payer: Self-pay | Admitting: Hematology and Oncology

## 2016-07-17 ENCOUNTER — Ambulatory Visit (HOSPITAL_BASED_OUTPATIENT_CLINIC_OR_DEPARTMENT_OTHER): Payer: PPO

## 2016-07-17 VITALS — BP 119/52 | HR 94

## 2016-07-17 DIAGNOSIS — R000 Tachycardia, unspecified: Secondary | ICD-10-CM

## 2016-07-17 DIAGNOSIS — Z5111 Encounter for antineoplastic chemotherapy: Secondary | ICD-10-CM

## 2016-07-17 DIAGNOSIS — N189 Chronic kidney disease, unspecified: Secondary | ICD-10-CM | POA: Diagnosis not present

## 2016-07-17 DIAGNOSIS — Z170 Estrogen receptor positive status [ER+]: Secondary | ICD-10-CM | POA: Diagnosis not present

## 2016-07-17 DIAGNOSIS — C50212 Malignant neoplasm of upper-inner quadrant of left female breast: Secondary | ICD-10-CM

## 2016-07-17 DIAGNOSIS — D6481 Anemia due to antineoplastic chemotherapy: Secondary | ICD-10-CM | POA: Diagnosis not present

## 2016-07-17 LAB — CBC WITH DIFFERENTIAL/PLATELET
BASO%: 1.4 % (ref 0.0–2.0)
BASOS ABS: 0.1 10*3/uL (ref 0.0–0.1)
EOS%: 2.4 % (ref 0.0–7.0)
Eosinophils Absolute: 0.1 10*3/uL (ref 0.0–0.5)
HEMATOCRIT: 24.4 % — AB (ref 34.8–46.6)
HGB: 8.1 g/dL — ABNORMAL LOW (ref 11.6–15.9)
LYMPH%: 26.3 % (ref 14.0–49.7)
MCH: 32 pg (ref 25.1–34.0)
MCHC: 33.2 g/dL (ref 31.5–36.0)
MCV: 96.4 fL (ref 79.5–101.0)
MONO#: 0.3 10*3/uL (ref 0.1–0.9)
MONO%: 9.2 % (ref 0.0–14.0)
NEUT#: 2.2 10*3/uL (ref 1.5–6.5)
NEUT%: 60.7 % (ref 38.4–76.8)
PLATELETS: 160 10*3/uL (ref 145–400)
RBC: 2.53 10*6/uL — ABNORMAL LOW (ref 3.70–5.45)
RDW: 20.6 % — ABNORMAL HIGH (ref 11.2–14.5)
WBC: 3.7 10*3/uL — ABNORMAL LOW (ref 3.9–10.3)
lymph#: 1 10*3/uL (ref 0.9–3.3)
nRBC: 2 % — ABNORMAL HIGH (ref 0–0)

## 2016-07-17 LAB — COMPREHENSIVE METABOLIC PANEL
ALBUMIN: 3.5 g/dL (ref 3.5–5.0)
ALK PHOS: 108 U/L (ref 40–150)
ALT: 42 U/L (ref 0–55)
AST: 31 U/L (ref 5–34)
Anion Gap: 9 mEq/L (ref 3–11)
BILIRUBIN TOTAL: 0.52 mg/dL (ref 0.20–1.20)
BUN: 16.5 mg/dL (ref 7.0–26.0)
CALCIUM: 9.8 mg/dL (ref 8.4–10.4)
CO2: 23 mEq/L (ref 22–29)
CREATININE: 1.4 mg/dL — AB (ref 0.6–1.1)
Chloride: 109 mEq/L (ref 98–109)
EGFR: 37 mL/min/{1.73_m2} — ABNORMAL LOW (ref >90)
GLUCOSE: 207 mg/dL — AB (ref 70–140)
POTASSIUM: 3.9 meq/L (ref 3.5–5.1)
SODIUM: 141 meq/L (ref 136–145)
TOTAL PROTEIN: 6.2 g/dL — AB (ref 6.4–8.3)

## 2016-07-17 LAB — TECHNOLOGIST REVIEW

## 2016-07-17 MED ORDER — HEPARIN SOD (PORK) LOCK FLUSH 100 UNIT/ML IV SOLN
500.0000 [IU] | Freq: Once | INTRAVENOUS | Status: AC | PRN
  Administered 2016-07-17: 500 [IU]
  Filled 2016-07-17: qty 5

## 2016-07-17 MED ORDER — SODIUM CHLORIDE 0.9% FLUSH
10.0000 mL | INTRAVENOUS | Status: DC | PRN
  Administered 2016-07-17: 10 mL
  Filled 2016-07-17: qty 10

## 2016-07-17 MED ORDER — PROCHLORPERAZINE MALEATE 10 MG PO TABS
10.0000 mg | ORAL_TABLET | Freq: Once | ORAL | Status: AC
  Administered 2016-07-17: 10 mg via ORAL

## 2016-07-17 MED ORDER — PACLITAXEL PROTEIN-BOUND CHEMO INJECTION 100 MG
65.0000 mg/m2 | Freq: Once | INTRAVENOUS | Status: AC
  Administered 2016-07-17: 125 mg via INTRAVENOUS
  Filled 2016-07-17: qty 25

## 2016-07-17 MED ORDER — SODIUM CHLORIDE 0.9 % IV SOLN
Freq: Once | INTRAVENOUS | Status: AC
  Administered 2016-07-17: 11:00:00 via INTRAVENOUS

## 2016-07-17 MED ORDER — PROCHLORPERAZINE MALEATE 10 MG PO TABS
ORAL_TABLET | ORAL | Status: AC
  Filled 2016-07-17: qty 1

## 2016-07-17 NOTE — Progress Notes (Signed)
Okay to proceed with treatment today despite HR 103 per [name].

## 2016-07-17 NOTE — Patient Instructions (Signed)
[facility] Discharge Instructions for Patients Receiving Chemotherapy  Today you received the following chemotherapy agents :  Abraxane.  To help prevent nausea and vomiting after your treatment, we encourage you to take your nausea medication as prescribed.   If you develop nausea and vomiting that is not controlled by your nausea medication, call the clinic.   BELOW ARE SYMPTOMS THAT SHOULD BE REPORTED IMMEDIATELY:  *FEVER GREATER THAN 100.5 F  *CHILLS WITH OR WITHOUT FEVER  NAUSEA AND VOMITING THAT IS NOT CONTROLLED WITH YOUR NAUSEA MEDICATION  *UNUSUAL SHORTNESS OF BREATH  *UNUSUAL BRUISING OR BLEEDING  TENDERNESS IN MOUTH AND THROAT WITH OR WITHOUT PRESENCE OF ULCERS  *URINARY PROBLEMS  *BOWEL PROBLEMS  UNUSUAL RASH Items with * indicate a potential emergency and should be followed up as soon as possible.  Feel free to call the clinic you have any questions or concerns. The clinic phone number is [phone].  Please show the [place] at check-in to the Emergency Department and triage nurse.

## 2016-07-17 NOTE — Assessment & Plan Note (Signed)
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 Based on this, I strongly recommend systemic chemotherapy.  Treatment plan: 1. Adjuvant chemotherapy with dose dense Adriamycin and Cytoxan 4 followed by Abraxane weekly 12 2. Adjuvant radiation therapy followed by 3. Adjuvant antiestrogen therapy ---------------------------------------------------------------------------------------------- Current treatment: completed 4 cycles of dose dense Adriamycin and Cytoxan (started at a slightly lower dose given her age), Today is Abrance cycle 6/12 Echocardiogram revealed EF of 60-65% on 04/16/2016  Chemotherapy toxicities: 1. Fatigue due to chemotherapy  2. Elevated ALT: Not due to chemotherapy, we will continue to watch and monitor this 3. Chemotherapy-induced anemia: Hemoglobin 7.9. Currently on oral iron supplementation twice a day 4. Lower extremity edema: Because of renal dysfunction, I encouraged her to stop Lasix. 5. Tachycardia: Probably related to anemia. I would like to switch her anti-hypertensive medication from lisinopril to Toprol-XL. I gave her a prescription of 25 mg daily on 07/10/2016. 6. Skin rash: Fixed drug eruption: Macular rash which later turned darker in color. This is on her hands and legs. I decreased the dosage of Abraxane from cycle 4. The rash did not get any worse however she developed redness at the tips of her toes. It may be that she is intolerant of dexamethasone. We discontinued Decadron. 7. Chronic kidney disease: Creatinine is 1.5. She stopped hydrochlorothiazide and this appears to be helping.  Return to clinic in 2 weeks for follow up.

## 2016-07-17 NOTE — Progress Notes (Signed)
Patient Care Team: [name], MD as PCP - General Macarthur Critchley, OD as Referring Physician (Optometry) [name], MD as Consulting Physician (Dermatology) Latanya Maudlin, MD as Consulting Physician (Orthopedic Surgery) Inda Castle, MD as Consulting Physician (Gastroenterology) [name], MD as Consulting Physician (Hematology and Oncology)  DIAGNOSIS: Breast cancer of upper-inner quadrant of left female breast [facility])   Staging form: Breast, AJCC 7th Edition   - Clinical stage from 03/05/2016: Stage IIA (T2, N0, M0) - Unsigned   - Pathologic: Stage IIB (T3, N0, cM0) - Signed by [name], MD on 03/27/2016  SUMMARY OF ONCOLOGIC HISTORY:   Breast cancer of upper-inner quadrant of left female breast ([place])   02/26/2016 Initial Diagnosis    Left breast biopsy 11:00 position: invasive ductal carcinoma with DCIS, ER 90%, PR 10%, HER-2 negative, Ki-67 30%, grade 2, 2.2 cm palpable lesion T2 N0 stage II a clinical stage     03/18/2016 Surgery    Left lumpectomy: Invasive ductal carcinoma, grade 2, 6.3 cm, with high-grade DCIS, margins negative, 0/4 lymph nodes negative, ER 90%, via 10%, HER-2 negative ratio 0.97, Ki-67 30%, T3 N0 stage IIB     03/25/2016 Procedure    Genetic testing is negative for pathogenic mutations within any of the 20 Genes on the breast/ovarian cancer panel     04/04/2016 Oncotype testing    Oncotype DX recurrence score 37, 25% 10 year distant risk of recurrence     04/17/2016 -  Chemotherapy    Adjuvant chemotherapy with dose dense Adriamycin and Cytoxan followed by Abraxane weekly 12      CHIEF COMPLIANT: Cycle 6 Abraxane   INTERVAL HISTORY: Kayla Baker is a 80 year old with above-mentioned history left breast cancer treated with lumpectomy and is currently on adjuvant chemotherapy today cycle 6 of Abraxane. She had bilateral toe redness which is still persistent. We had good use of dosage of Abraxane and there is no further progression of her  symptoms.  REVIEW OF SYSTEMS:   Constitutional: Denies fevers, chills or abnormal weight loss Eyes: Denies blurriness of vision Ears, nose, mouth, throat, and face: Denies mucositis or sore throat Respiratory: Denies cough, dyspnea or wheezes Cardiovascular: Denies palpitation, chest discomfort Gastrointestinal:  Denies nausea, heartburn or change in bowel habits Skin: Denies abnormal skin rashes Lymphatics: Denies new lymphadenopathy or easy bruising Neurological:Denies numbness, tingling or new weaknesses Behavioral/Psych: Mood is stable, no new changes  Extremities: Lower extremity redness and the toes is stable All other systems were reviewed with the patient and are negative.  I have reviewed the past medical history, past surgical history, social history and family history with the patient and they are unchanged from previous note.  ALLERGIES:  is allergic to keflex [cephalexin]; meloxicam; prednisone; premarin [estrogens conjugated]; and trovan [alatrofloxacin].  MEDICATIONS:  Current Outpatient Prescriptions  Medication Sig Dispense Refill  . allopurinol (ZYLOPRIM) 300 MG tablet TAKE 1 TABLET BY MOUTH EVERY DAY 90 tablet 1  . amitriptyline (ELAVIL) 25 MG tablet Take 1 to 2 tablets at bedtime for neuritis 180 tablet 1  . aspirin 81 MG tablet Take 81 mg by mouth daily.    Marland Kitchen atorvastatin (LIPITOR) 40 MG tablet TAKE 1 TABLET BY MOUTH EVERY DAY 90 tablet 1  . chlorhexidine (HIBICLENS) 4 % external liquid Apply 15 mLs (1 application total) topically once. (Patient not taking: Reported on 04/16/2016) 120 mL 0  . enalapril (VASOTEC) 20 MG tablet TAKE 1 TABLET BY MOUTH DAILY 90 tablet 0  . furosemide (LASIX)  20 MG tablet     . gabapentin (NEURONTIN) 100 MG capsule Take 1 to 3 capsules at bedtime for neuritis 90 capsule 5  . glucose blood (FREESTYLE LITE) test strip USE ONE STRIP TO CHECK GLUCOSE ONCE DAILY. DX-E11.22 100 each 3  . lactated ringers infusion Inject 125 mLs into the vein  continuous. (Patient not taking: Reported on 04/16/2016) 250 mL 0  . Lancets (FREESTYLE) lancets Check blood sugar one time daily-E11.22 100 each 3  . LORazepam (ATIVAN) 0.5 MG tablet Take 0.5 mg by mouth 3 times/day as needed-between meals & bedtime for anxiety or sleep.    . metoprolol succinate (TOPROL XL) 25 MG 24 hr tablet Take 1 tablet (25 mg total) by mouth daily. 30 tablet 3  . ondansetron (ZOFRAN) 8 MG tablet   1  . prochlorperazine (COMPAZINE) 10 MG tablet     . ranitidine (ZANTAC) 300 MG tablet Take twice a day as needed for Heartburn & Acid Indigestion 60 tablet 3  . vitamin C (ASCORBIC ACID) 500 MG tablet Take 500 mg by mouth daily. Takes 2 tablets daily     No current facility-administered medications for this visit.    Facility-Administered Medications Ordered in Other Visits  Medication Dose Route Frequency Provider Last Rate Last Dose  . heparin lock flush 100 unit/mL  500 Units Intracatheter Once PRN [name], MD      . PACLitaxel-protein bound (ABRAXANE) chemo infusion 125 mg  65 mg/m2 (Treatment Plan Recorded) Intravenous Once [name], MD 50 mL/hr at 07/17/16 1125 125 mg at 07/17/16 1125  . sodium chloride flush (NS) 0.9 % injection 10 mL  10 mL Intracatheter PRN [name], MD        PHYSICAL EXAMINATION: ECOG PERFORMANCE STATUS: 1 - Symptomatic but completely ambulatory  Vitals:   07/17/16 0937  BP: (!) 123/9  Pulse: (!) 106  Resp: 18  Temp: 98.2 F (36.8 C)   Filed Weights   07/17/16 0937  Weight: 157 lb 14.4 oz (71.6 kg)    GENERAL:alert, no distress and comfortable SKIN: skin color, texture, turgor are normal, no rashes or significant lesions EYES: normal, Conjunctiva are pink and non-injected, sclera clear OROPHARYNX:no exudate, no erythema and lips, buccal mucosa, and tongue normal  NECK: supple, thyroid normal size, non-tender, without nodularity LYMPH:  no palpable lymphadenopathy in the cervical, axillary or inguinal LUNGS: clear to  auscultation and percussion with normal breathing effort HEART: regular rate & rhythm and no murmurs and no lower extremity edema ABDOMEN:abdomen soft, non-tender and normal bowel sounds MUSCULOSKELETAL:no cyanosis of digits and no clubbing  NEURO: alert & oriented x 3 with fluent speech, no focal motor/sensory deficits EXTREMITIES: 1+ lower extremity edema and redness in the toes  LABORATORY DATA:  I have reviewed the data as listed   Chemistry      Component Value Date/Time   NA 141 07/17/2016 0925   K 3.9 07/17/2016 0925   CL 103 04/02/2016 0940   CO2 23 07/17/2016 0925   BUN 16.5 07/17/2016 0925   CREATININE 1.4 (H) 07/17/2016 0925      Component Value Date/Time   CALCIUM 9.8 07/17/2016 0925   ALKPHOS 108 07/17/2016 0925   AST 31 07/17/2016 0925   ALT 42 07/17/2016 0925   BILITOT 0.52 07/17/2016 0925       Lab Results  Component Value Date   WBC 3.7 (L) 07/17/2016   HGB 8.1 (L) 07/17/2016   HCT 24.4 (L) 07/17/2016   MCV 96.4 07/17/2016  PLT 160 07/17/2016   NEUTROABS 2.2 07/17/2016     ASSESSMENT & PLAN:  Breast cancer of upper-inner quadrant of left female breast ([place]) Left lumpectomy 03/08/2016: Invasive ductal carcinoma, grade 2, 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 Based on this, I strongly recommend systemic chemotherapy.  Treatment plan: 1. Adjuvant chemotherapy with dose dense Adriamycin and Cytoxan 4 followed by Abraxane weekly 12 2. Adjuvant radiation therapy followed by 3. Adjuvant antiestrogen therapy ---------------------------------------------------------------------------------------------- Current treatment: completed 4 cycles of dose dense Adriamycin and Cytoxan (started at a slightly lower dose given her  age), Today is Abrance cycle 6/12 Echocardiogram revealed EF of 60-65% on 04/16/2016  Chemotherapy toxicities: 1. Fatigue due to chemotherapy  2. Elevated ALT: Not due to chemotherapy, we will continue to watch and monitor this 3. Chemotherapy-induced anemia: Hemoglobin 8.1. Currently on oral iron supplementation twice a day 4. Lower extremity edema: I recommended resuming Lasix 20 mg daily 7-10 days starting 07/17/2016. 5. Tachycardia: Probably related to anemia. Currently on Toprol-XL. Continues to have the same problem 6. Skin rash: Fixed drug eruption: Macular rash which later turned darker in color. This is on her hands and legs. I decreased the dosage of Abraxane from cycle 4. The rash did not get any worse however she developed redness at the tips of her toes. It may be that she is intolerant of dexamethasone. We discontinued Decadron. 7. Chronic kidney disease: Creatinine is 1.4. We will have to monitor this closely because of Lasix.  Return to clinic in 1 weeks for follow up.   No orders of the defined types were placed in this encounter.  The patient has a good understanding of the overall plan. she agrees with it. she will call with any problems that may develop before the next visit here.   Rulon Eisenmenger, MD 07/17/16

## 2016-07-24 ENCOUNTER — Ambulatory Visit (HOSPITAL_BASED_OUTPATIENT_CLINIC_OR_DEPARTMENT_OTHER): Payer: PPO | Admitting: Hematology and Oncology

## 2016-07-24 ENCOUNTER — Telehealth: Payer: Self-pay | Admitting: Oncology

## 2016-07-24 ENCOUNTER — Other Ambulatory Visit (HOSPITAL_BASED_OUTPATIENT_CLINIC_OR_DEPARTMENT_OTHER): Payer: PPO

## 2016-07-24 ENCOUNTER — Encounter: Payer: Self-pay | Admitting: Hematology and Oncology

## 2016-07-24 ENCOUNTER — Ambulatory Visit (HOSPITAL_BASED_OUTPATIENT_CLINIC_OR_DEPARTMENT_OTHER): Payer: PPO

## 2016-07-24 ENCOUNTER — Telehealth: Payer: Self-pay | Admitting: Hematology and Oncology

## 2016-07-24 DIAGNOSIS — Z170 Estrogen receptor positive status [ER+]: Secondary | ICD-10-CM | POA: Diagnosis not present

## 2016-07-24 DIAGNOSIS — N189 Chronic kidney disease, unspecified: Secondary | ICD-10-CM | POA: Diagnosis not present

## 2016-07-24 DIAGNOSIS — D6481 Anemia due to antineoplastic chemotherapy: Secondary | ICD-10-CM

## 2016-07-24 DIAGNOSIS — C50212 Malignant neoplasm of upper-inner quadrant of left female breast: Secondary | ICD-10-CM

## 2016-07-24 DIAGNOSIS — Z5111 Encounter for antineoplastic chemotherapy: Secondary | ICD-10-CM | POA: Diagnosis not present

## 2016-07-24 DIAGNOSIS — R000 Tachycardia, unspecified: Secondary | ICD-10-CM

## 2016-07-24 DIAGNOSIS — R600 Localized edema: Secondary | ICD-10-CM

## 2016-07-24 LAB — COMPREHENSIVE METABOLIC PANEL
ALBUMIN: 3.6 g/dL (ref 3.5–5.0)
ALK PHOS: 114 U/L (ref 40–150)
ALT: 42 U/L (ref 0–55)
AST: 37 U/L — ABNORMAL HIGH (ref 5–34)
Anion Gap: 10 mEq/L (ref 3–11)
BILIRUBIN TOTAL: 0.46 mg/dL (ref 0.20–1.20)
BUN: 16 mg/dL (ref 7.0–26.0)
CALCIUM: 9.8 mg/dL (ref 8.4–10.4)
CO2: 23 mEq/L (ref 22–29)
Chloride: 110 mEq/L — ABNORMAL HIGH (ref 98–109)
Creatinine: 1.5 mg/dL — ABNORMAL HIGH (ref 0.6–1.1)
EGFR: 32 mL/min/{1.73_m2} — AB (ref >90)
Glucose: 212 mg/dl — ABNORMAL HIGH (ref 70–140)
POTASSIUM: 4.3 meq/L (ref 3.5–5.1)
Sodium: 143 mEq/L (ref 136–145)
TOTAL PROTEIN: 6.2 g/dL — AB (ref 6.4–8.3)

## 2016-07-24 LAB — CBC WITH DIFFERENTIAL/PLATELET
BASO%: 1.3 % (ref 0.0–2.0)
BASOS ABS: 0.1 10*3/uL (ref 0.0–0.1)
EOS ABS: 0.1 10*3/uL (ref 0.0–0.5)
EOS%: 2.3 % (ref 0.0–7.0)
HEMATOCRIT: 25.1 % — AB (ref 34.8–46.6)
HEMOGLOBIN: 8.2 g/dL — AB (ref 11.6–15.9)
LYMPH#: 1.5 10*3/uL (ref 0.9–3.3)
LYMPH%: 37.8 % (ref 14.0–49.7)
MCH: 32.3 pg (ref 25.1–34.0)
MCHC: 32.7 g/dL (ref 31.5–36.0)
MCV: 98.8 fL (ref 79.5–101.0)
MONO#: 0.3 10*3/uL (ref 0.1–0.9)
MONO%: 8.8 % (ref 0.0–14.0)
NEUT%: 49.8 % (ref 38.4–76.8)
NEUTROS ABS: 1.9 10*3/uL (ref 1.5–6.5)
PLATELETS: 165 10*3/uL (ref 145–400)
RBC: 2.54 10*6/uL — ABNORMAL LOW (ref 3.70–5.45)
RDW: 19.1 % — AB (ref 11.2–14.5)
WBC: 3.9 10*3/uL (ref 3.9–10.3)

## 2016-07-24 LAB — TECHNOLOGIST REVIEW

## 2016-07-24 MED ORDER — SODIUM CHLORIDE 0.9% FLUSH
10.0000 mL | INTRAVENOUS | Status: DC | PRN
  Administered 2016-07-24: 10 mL
  Filled 2016-07-24: qty 10

## 2016-07-24 MED ORDER — SODIUM CHLORIDE 0.9 % IV SOLN
Freq: Once | INTRAVENOUS | Status: AC
  Administered 2016-07-24: 11:00:00 via INTRAVENOUS

## 2016-07-24 MED ORDER — PROCHLORPERAZINE MALEATE 10 MG PO TABS
ORAL_TABLET | ORAL | Status: AC
  Filled 2016-07-24: qty 1

## 2016-07-24 MED ORDER — PACLITAXEL PROTEIN-BOUND CHEMO INJECTION 100 MG
65.0000 mg/m2 | Freq: Once | INTRAVENOUS | Status: AC
  Administered 2016-07-24: 125 mg via INTRAVENOUS
  Filled 2016-07-24: qty 25

## 2016-07-24 MED ORDER — HEPARIN SOD (PORK) LOCK FLUSH 100 UNIT/ML IV SOLN
500.0000 [IU] | Freq: Once | INTRAVENOUS | Status: AC | PRN
  Administered 2016-07-24: 500 [IU]
  Filled 2016-07-24: qty 5

## 2016-07-24 MED ORDER — FUROSEMIDE 20 MG PO TABS: 40.0000 mg | ORAL_TABLET | Freq: Every day | ORAL | 2 refills | Status: DC

## 2016-07-24 MED ORDER — PROCHLORPERAZINE MALEATE 10 MG PO TABS
10.0000 mg | ORAL_TABLET | Freq: Once | ORAL | Status: AC
  Administered 2016-07-24: 10 mg via ORAL

## 2016-07-24 NOTE — Assessment & Plan Note (Signed)
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 Based on this, I strongly recommend systemic chemotherapy.  Treatment plan: 1. Adjuvant chemotherapy with dose dense Adriamycin and Cytoxan 4 followed by Abraxane weekly 12 2. Adjuvant radiation therapy followed by 3. Adjuvant antiestrogen therapy ---------------------------------------------------------------------------------------------- Current treatment: completed 4 cycles of dose dense Adriamycin and Cytoxan (started at a slightly lower dose given her age), Today is Abrance cycle 6/12 Echocardiogram revealed EF of 60-65% on 04/16/2016  Chemotherapy toxicities: 1. Fatigue due to chemotherapy  2. Elevated ALT: Not due to chemotherapy, we will continue to watch and monitor this 3. Chemotherapy-induced anemia: Hemoglobin 8.1. Currently on oral iron supplementation twice a day 4. Lower extremity edema:On Lasix 20 mg daily 7-10 days starting 07/17/2016. 5. Tachycardia: Probably related to anemia. Currently on Toprol-XL. Continues to have the same problem 6. Skin rash: Fixed drug eruption: Macular rash which later turned darker in color. This is on her hands and legs. I decreased the dosage of Abraxane from cycle 4. The rash did not get any worse however she developed redness at the tips of her toes. It may be that she is intolerant of dexamethasone. We discontinued Decadron. 7. Chronic kidney disease: Creatinine is 1.4. We will have to monitor this closely because of Lasix.  Return to clinic in 1 week for follow up.

## 2016-07-24 NOTE — Telephone Encounter (Signed)
appt made and avs to print in treatment room

## 2016-07-24 NOTE — Telephone Encounter (Signed)
error 

## 2016-07-24 NOTE — Progress Notes (Signed)
Patient Care Team: [name], MD as PCP - General Macarthur Critchley, OD as Referring Physician (Optometry) [name], MD as Consulting Physician (Dermatology) Latanya Maudlin, MD as Consulting Physician (Orthopedic Surgery) Inda Castle, MD as Consulting Physician (Gastroenterology) [name], MD as Consulting Physician (Hematology and Oncology)  DIAGNOSIS: Breast cancer of upper-inner quadrant of left female breast [facility])   Staging form: Breast, AJCC 7th Edition   - Clinical stage from 03/05/2016: Stage IIA (T2, N0, M0) - Unsigned   - Pathologic: Stage IIB (T3, N0, cM0) - Signed by [name], MD on 03/27/2016  SUMMARY OF ONCOLOGIC HISTORY:   Breast cancer of upper-inner quadrant of left female breast ([place])   02/26/2016 Initial Diagnosis    Left breast biopsy 11:00 position: invasive ductal carcinoma with DCIS, ER 90%, PR 10%, HER-2 negative, Ki-67 30%, grade 2, 2.2 cm palpable lesion T2 N0 stage II a clinical stage      03/18/2016 Surgery    Left lumpectomy: Invasive ductal carcinoma, grade 2, 6.3 cm, with high-grade DCIS, margins negative, 0/4 lymph nodes negative, ER 90%, via 10%, HER-2 negative ratio 0.97, Ki-67 30%, T3 N0 stage IIB      03/25/2016 Procedure    Genetic testing is negative for pathogenic mutations within any of the 20 Genes on the breast/ovarian cancer panel      04/04/2016 Oncotype testing    Oncotype DX recurrence score 37, 25% 10 year distant risk of recurrence      04/17/2016 -  Chemotherapy    Adjuvant chemotherapy with dose dense Adriamycin and Cytoxan followed by Abraxane weekly 12       CHIEF COMPLIANT: Abraxane week 6  INTERVAL HISTORY: Kayla Baker is a 80 year old with above-mentioned history of left breast cancer currently on adjuvant chemotherapy and today is week 6 of Abraxane. She continues to have lower extremity edema. She was put on Lasix 20 mg daily but that has not improved her leg swelling. She denies any neuropathy. One of her  toenails appears to be very modest and might need to be pulled out. She has a podiatrist that she sees regularly. The plan is for her to get started on antifungal medication. She denies any nausea or vomiting. She stays very active and has walked every day. She does feel slightly short of breath with severe exertion.  REVIEW OF SYSTEMS:   Constitutional: Denies fevers, chills or abnormal weight loss, fatigue Eyes: Denies blurriness of vision Ears, nose, mouth, throat, and face: Denies mucositis or sore throat Respiratory: Shortness of breath or exertion Cardiovascular: Denies palpitation, chest discomfort Gastrointestinal:  Denies nausea, heartburn or change in bowel habits Skin: Denies abnormal skin rashes Lymphatics: Denies new lymphadenopathy or easy bruising Neurological:Denies numbness, tingling or new weaknesses Behavioral/Psych: Mood is stable, no new changes  Extremities: Nails appear to be elevated from the infection probably in the toenail, bilateral lower extremity edema 2+ All other systems were reviewed with the patient and are negative.  I have reviewed the past medical history, past surgical history, social history and family history with the patient and they are unchanged from previous note.  ALLERGIES:  is allergic to keflex [cephalexin]; meloxicam; prednisone; premarin [estrogens conjugated]; and trovan [alatrofloxacin].  MEDICATIONS:  Current Outpatient Prescriptions  Medication Sig Dispense Refill  . allopurinol (ZYLOPRIM) 300 MG tablet TAKE 1 TABLET BY MOUTH EVERY DAY 90 tablet 1  . amitriptyline (ELAVIL) 25 MG tablet Take 1 to 2 tablets at bedtime for neuritis 180 tablet 1  . aspirin  81 MG tablet Take 81 mg by mouth daily.    Marland Kitchen atorvastatin (LIPITOR) 40 MG tablet TAKE 1 TABLET BY MOUTH EVERY DAY 90 tablet 1  . chlorhexidine (HIBICLENS) 4 % external liquid Apply 15 mLs (1 application total) topically once. (Patient not taking: Reported on 04/16/2016) 120 mL 0  .  enalapril (VASOTEC) 20 MG tablet TAKE 1 TABLET BY MOUTH DAILY 90 tablet 0  . furosemide (LASIX) 20 MG tablet     . gabapentin (NEURONTIN) 100 MG capsule Take 1 to 3 capsules at bedtime for neuritis 90 capsule 5  . glucose blood (FREESTYLE LITE) test strip USE ONE STRIP TO CHECK GLUCOSE ONCE DAILY. DX-E11.22 100 each 3  . lactated ringers infusion Inject 125 mLs into the vein continuous. (Patient not taking: Reported on 04/16/2016) 250 mL 0  . Lancets (FREESTYLE) lancets Check blood sugar one time daily-E11.22 100 each 3  . LORazepam (ATIVAN) 0.5 MG tablet Take 0.5 mg by mouth 3 times/day as needed-between meals & bedtime for anxiety or sleep.    . metoprolol succinate (TOPROL XL) 25 MG 24 hr tablet Take 1 tablet (25 mg total) by mouth daily. 30 tablet 3  . ondansetron (ZOFRAN) 8 MG tablet   1  . prochlorperazine (COMPAZINE) 10 MG tablet     . ranitidine (ZANTAC) 300 MG tablet Take twice a day as needed for Heartburn & Acid Indigestion 60 tablet 3  . vitamin C (ASCORBIC ACID) 500 MG tablet Take 500 mg by mouth daily. Takes 2 tablets daily     No current facility-administered medications for this visit.     PHYSICAL EXAMINATION: ECOG PERFORMANCE STATUS: 1 - Symptomatic but completely ambulatory  Vitals:   07/24/16 0950  BP: 125/68  Pulse: (!) 110  Resp: 18  Temp: 98.3 F (36.8 C)   Filed Weights   07/24/16 0950  Weight: 160 lb 3.2 oz (72.7 kg)    GENERAL:alert, no distress and comfortable SKIN: skin color, texture, turgor are normal, no rashes or significant lesions EYES: normal, Conjunctiva are pink and non-injected, sclera clear OROPHARYNX:no exudate, no erythema and lips, buccal mucosa, and tongue normal  NECK: supple, thyroid normal size, non-tender, without nodularity LYMPH:  no palpable lymphadenopathy in the cervical, axillary or inguinal LUNGS: clear to auscultation and percussion with normal breathing effort HEART: Tachycardia ABDOMEN:abdomen soft, non-tender and normal  bowel sounds MUSCULOSKELETAL:no cyanosis of digits and no clubbing  NEURO: alert & oriented x 3 with fluent speech, no focal motor/sensory deficits EXTREMITIES: Right toenail fungus , Extremities, 2+ edema  LABORATORY DATA:  I have reviewed the data as listed   Chemistry      Component Value Date/Time   NA 141 07/17/2016 0925   K 3.9 07/17/2016 0925   CL 103 04/02/2016 0940   CO2 23 07/17/2016 0925   BUN 16.5 07/17/2016 0925   CREATININE 1.4 (H) 07/17/2016 0925      Component Value Date/Time   CALCIUM 9.8 07/17/2016 0925   ALKPHOS 108 07/17/2016 0925   AST 31 07/17/2016 0925   ALT 42 07/17/2016 0925   BILITOT 0.52 07/17/2016 0925       Lab Results  Component Value Date   WBC 3.9 07/24/2016   HGB 8.2 (L) 07/24/2016   HCT 25.1 (L) 07/24/2016   MCV 98.8 07/24/2016   PLT 165 07/24/2016   NEUTROABS 1.9 07/24/2016     ASSESSMENT & PLAN:  Breast cancer of upper-inner quadrant of left female breast ([place]) Left lumpectomy 03/08/2016: Invasive ductal carcinoma,  grade 2, 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 Based on this, I strongly recommend systemic chemotherapy.  Treatment plan: 1. Adjuvant chemotherapy with dose dense Adriamycin and Cytoxan 4 followed by Abraxane weekly 12 2. Adjuvant radiation therapy followed by 3. Adjuvant antiestrogen therapy ---------------------------------------------------------------------------------------------- Current treatment: completed 4 cycles of dose dense Adriamycin and Cytoxan (started at a slightly lower dose given her age), Today is Abrance cycle 6/12 Echocardiogram revealed EF of 60-65% on 04/16/2016  Chemotherapy toxicities: 1. Fatigue due to chemotherapy  2. Elevated ALT: Not due to  chemotherapy, we will continue to watch and monitor this 3. Chemotherapy-induced anemia: Hemoglobin 8.2. Currently on oral iron supplementation twice a day 4. Lower extremity edema: Increased Lasix to 40 mg daily. Encouraged her to eat more potassium containing foods. 5. Tachycardia: Probably related to anemia. Currently on Toprol-XL. Continues to have the same problem 6. Skin rash: Fixed drug eruption: decreased the dosage of Abraxane from cycle 4. Rash has improved. 7. Chronic kidney disease: Creatinine is 1.4. We will have to monitor this closely because of Lasix.  Patient does exercise by walking frequently. She denies any neuropathy. Return to clinic in 2 weeks for follow up.   No orders of the defined types were placed in this encounter.  The patient has a good understanding of the overall plan. she agrees with it. she will call with any problems that may develop before the next visit here.   Rulon Eisenmenger, MD 07/24/16

## 2016-07-24 NOTE — Patient Instructions (Signed)
[place] Discharge Instructions for Patients Receiving Chemotherapy  Today you received the following chemotherapy agents Abraxane To help prevent nausea and vomiting after your treatment, we encourage you to take your nausea medication as prescribed.   If you develop nausea and vomiting that is not controlled by your nausea medication, call the clinic.   BELOW ARE SYMPTOMS THAT SHOULD BE REPORTED IMMEDIATELY:  *FEVER GREATER THAN 100.5 F  *CHILLS WITH OR WITHOUT FEVER  NAUSEA AND VOMITING THAT IS NOT CONTROLLED WITH YOUR NAUSEA MEDICATION  *UNUSUAL SHORTNESS OF BREATH  *UNUSUAL BRUISING OR BLEEDING  TENDERNESS IN MOUTH AND THROAT WITH OR WITHOUT PRESENCE OF ULCERS  *URINARY PROBLEMS  *BOWEL PROBLEMS  UNUSUAL RASH Items with * indicate a potential emergency and should be followed up as soon as possible.  Feel free to call the clinic you have any questions or concerns. The clinic phone number is [phone].  Please show the [place] at check-in to the Emergency Department and triage nurse.

## 2016-07-31 ENCOUNTER — Other Ambulatory Visit (HOSPITAL_BASED_OUTPATIENT_CLINIC_OR_DEPARTMENT_OTHER): Payer: PPO

## 2016-07-31 ENCOUNTER — Ambulatory Visit (HOSPITAL_BASED_OUTPATIENT_CLINIC_OR_DEPARTMENT_OTHER): Payer: PPO

## 2016-07-31 VITALS — BP 123/60 | HR 93 | Temp 98.2°F | Resp 19

## 2016-07-31 DIAGNOSIS — Z5111 Encounter for antineoplastic chemotherapy: Secondary | ICD-10-CM | POA: Diagnosis not present

## 2016-07-31 DIAGNOSIS — C50212 Malignant neoplasm of upper-inner quadrant of left female breast: Secondary | ICD-10-CM

## 2016-07-31 LAB — TECHNOLOGIST REVIEW

## 2016-07-31 LAB — CBC WITH DIFFERENTIAL/PLATELET
BASO%: 1.3 % (ref 0.0–2.0)
Basophils Absolute: 0 10*3/uL (ref 0.0–0.1)
EOS%: 1.3 % (ref 0.0–7.0)
Eosinophils Absolute: 0 10*3/uL (ref 0.0–0.5)
HEMATOCRIT: 23.9 % — AB (ref 34.8–46.6)
HGB: 7.9 g/dL — ABNORMAL LOW (ref 11.6–15.9)
LYMPH%: 28.9 % (ref 14.0–49.7)
MCH: 32.9 pg (ref 25.1–34.0)
MCHC: 33.1 g/dL (ref 31.5–36.0)
MCV: 99.6 fL (ref 79.5–101.0)
MONO#: 0.3 10*3/uL (ref 0.1–0.9)
MONO%: 8.9 % (ref 0.0–14.0)
NEUT#: 1.8 10*3/uL (ref 1.5–6.5)
NEUT%: 59.6 % (ref 38.4–76.8)
Platelets: 180 10*3/uL (ref 145–400)
RBC: 2.4 10*6/uL — ABNORMAL LOW (ref 3.70–5.45)
RDW: 18.1 % — ABNORMAL HIGH (ref 11.2–14.5)
WBC: 3.1 10*3/uL — ABNORMAL LOW (ref 3.9–10.3)
lymph#: 0.9 10*3/uL (ref 0.9–3.3)
nRBC: 3 % — ABNORMAL HIGH (ref 0–0)

## 2016-07-31 LAB — COMPREHENSIVE METABOLIC PANEL
ALT: 34 U/L (ref 0–55)
ANION GAP: 10 meq/L (ref 3–11)
AST: 31 U/L (ref 5–34)
Albumin: 3.5 g/dL (ref 3.5–5.0)
Alkaline Phosphatase: 106 U/L (ref 40–150)
BUN: 16.6 mg/dL (ref 7.0–26.0)
CALCIUM: 9.5 mg/dL (ref 8.4–10.4)
CHLORIDE: 107 meq/L (ref 98–109)
CO2: 23 mEq/L (ref 22–29)
Creatinine: 1.5 mg/dL — ABNORMAL HIGH (ref 0.6–1.1)
EGFR: 32 mL/min/{1.73_m2} — AB (ref >90)
Glucose: 236 mg/dl — ABNORMAL HIGH (ref 70–140)
POTASSIUM: 4.3 meq/L (ref 3.5–5.1)
Sodium: 140 mEq/L (ref 136–145)
Total Bilirubin: 0.51 mg/dL (ref 0.20–1.20)
Total Protein: 6 g/dL — ABNORMAL LOW (ref 6.4–8.3)

## 2016-07-31 MED ORDER — SODIUM CHLORIDE 0.9 % IV SOLN
Freq: Once | INTRAVENOUS | Status: AC
  Administered 2016-07-31: 10:00:00 via INTRAVENOUS

## 2016-07-31 MED ORDER — SODIUM CHLORIDE 0.9% FLUSH
10.0000 mL | INTRAVENOUS | Status: DC | PRN
  Administered 2016-07-31: 10 mL
  Filled 2016-07-31: qty 10

## 2016-07-31 MED ORDER — PROCHLORPERAZINE MALEATE 10 MG PO TABS
10.0000 mg | ORAL_TABLET | Freq: Once | ORAL | Status: AC
  Administered 2016-07-31: 10 mg via ORAL

## 2016-07-31 MED ORDER — PROCHLORPERAZINE MALEATE 10 MG PO TABS
ORAL_TABLET | ORAL | Status: AC
  Filled 2016-07-31: qty 1

## 2016-07-31 MED ORDER — PACLITAXEL PROTEIN-BOUND CHEMO INJECTION 100 MG
65.0000 mg/m2 | Freq: Once | INTRAVENOUS | Status: AC
  Administered 2016-07-31: 125 mg via INTRAVENOUS
  Filled 2016-07-31: qty 25

## 2016-07-31 MED ORDER — HEPARIN SOD (PORK) LOCK FLUSH 100 UNIT/ML IV SOLN
500.0000 [IU] | Freq: Once | INTRAVENOUS | Status: AC | PRN
  Administered 2016-07-31: 500 [IU]
  Filled 2016-07-31: qty 5

## 2016-07-31 NOTE — Progress Notes (Signed)
CMET/CBC REVIEWED BY MD, OK TO TREAT HGB 7.9

## 2016-07-31 NOTE — Patient Instructions (Signed)
[place] Discharge Instructions for Patients Receiving Chemotherapy  Today you received the following chemotherapy agents Abraxane To help prevent nausea and vomiting after your treatment, we encourage you to take your nausea medication as prescribed.   If you develop nausea and vomiting that is not controlled by your nausea medication, call the clinic.   BELOW ARE SYMPTOMS THAT SHOULD BE REPORTED IMMEDIATELY:  *FEVER GREATER THAN 100.5 F  *CHILLS WITH OR WITHOUT FEVER  NAUSEA AND VOMITING THAT IS NOT CONTROLLED WITH YOUR NAUSEA MEDICATION  *UNUSUAL SHORTNESS OF BREATH  *UNUSUAL BRUISING OR BLEEDING  TENDERNESS IN MOUTH AND THROAT WITH OR WITHOUT PRESENCE OF ULCERS  *URINARY PROBLEMS  *BOWEL PROBLEMS  UNUSUAL RASH Items with * indicate a potential emergency and should be followed up as soon as possible.  Feel free to call the clinic you have any questions or concerns. The clinic phone number is [phone].  Please show the [place] at check-in to the Emergency Department and triage nurse.

## 2016-08-01 ENCOUNTER — Ambulatory Visit (INDEPENDENT_AMBULATORY_CARE_PROVIDER_SITE_OTHER): Payer: PPO | Admitting: Internal Medicine

## 2016-08-01 ENCOUNTER — Encounter: Payer: Self-pay | Admitting: Internal Medicine

## 2016-08-01 VITALS — BP 106/54 | HR 108 | Temp 97.4°F | Resp 16 | Ht 64.0 in | Wt 159.0 lb

## 2016-08-01 DIAGNOSIS — I10 Essential (primary) hypertension: Secondary | ICD-10-CM | POA: Diagnosis not present

## 2016-08-01 DIAGNOSIS — L600 Ingrowing nail: Secondary | ICD-10-CM

## 2016-08-01 DIAGNOSIS — B351 Tinea unguium: Secondary | ICD-10-CM | POA: Diagnosis not present

## 2016-08-02 MED ORDER — TERBINAFINE HCL 250 MG PO TABS: 250.0000 mg | ORAL_TABLET | Freq: Every day | ORAL | 0 refills | Status: DC

## 2016-08-02 NOTE — Progress Notes (Signed)
Subjective:    Patient ID: Kayla Baker, female    DOB: 1936-07-10, 80 y.o.   MRN: [id number]  HPI   This very nice 80 yo MWF with HTN who is currently undergoing Chemo for Triple (+) Breast Ca presents for recheck of BP with neg c/o HA, dizziness, CP, palpitations, dyspnea or edema. She also is c/o a painful R great toe.   Medication Sig  . allopurinol  300 MG tablet TAKE 1 TABLET BY MOUTH EVERY DAY  . aspirin 81 MG tablet Take 81 mg by mouth daily.  Marland Kitchen atorvastatin  40 MG tablet TAKE 1 TABLET BY MOUTH EVERY DAY  . HIBICLENS 4 % external liquid Apply 15 mLs (1 application total) topically once.  . enalapril  20 MG tablet TAKE 1 TABLET BY MOUTH DAILY  . furosemide20 MG tablet Take 2 tablets (40 mg total) by mouth daily.  Marland Kitchen LORazepam  0.5 MG tablet Take  3 times/day as needed-  . metoprolol succinate-XL 25 MG  Take 1 tablet (25 mg total) by mouth daily.  . Ondansetron 8 MG tablet   . prochlorperazine  10 MG tablet   . Ranitidine 300 MG tablet Take twice a day as needed for Heartburn & Acid Indigestion  . vitamin C  500 MG tablet Take 500 mg by mouth daily. Takes 2 tablets daily  . amitriptyline  25 MG tablet Take 1 to 2 tablets at bedtime for neuritis  . Gabapentin 100 MG capsule Take 1 to 3 capsules at bedtime for neuritis   Allergies  Allergen Reactions  . Keflex [Cephalexin] Swelling    Tongue and throat swells  . Meloxicam Swelling  . Prednisone Swelling    Can Not take High doses  . Premarin [Estrogens Conjugated] Hives  . Trovan [Alatrofloxacin] Other (See Comments)    Unknown reaction   Past Medical History:  Diagnosis Date  . Allergy   . Anemia   . Arthritis   . Breast cancer of upper-inner quadrant of left female breast ([place]) 02/29/2016   skin- 2016- squamous- on right upper arm  . Diet-controlled type 2 diabetes mellitus ([place])    diet and exercsise,Has never been on meds  . Diverticulosis   . GERD (gastroesophageal reflux disease)   . Gout    takes Allopurinol  daily  . History of colon polyps    benign  . History of shingles   . Hyperlipidemia    takes Atorvastatin daily  . Hypertension    takes Enalapril and HCTZ daily  . Peripheral neuropathy (H[facility])    takes Gabapentin and Elavil daily  . PONV (postoperative nausea and vomiting)   . Vitamin D deficiency    Past Surgical History:  Procedure Laterality Date  . ABDOMINAL HYSTERECTOMY  1973  . CARDIAC CATHETERIZATION    . CATARACT EXTRACTION, BILATERAL  2014   right eye 2/14; left eye 3/3  . COLONOSCOPY    . HAND SURGERY Left 2012  . INCONTINENCE SURGERY  2006  . KNEE ARTHROSCOPY Right   . PORTACATH PLACEMENT N/A 04/14/2016   Procedure: INSERTION PORT-A-CATH ;  Surgeon: Fanny Skates, MD;  Location: [facility];  Service: General;  Laterality: N/A;  . RADIOACTIVE SEED GUIDED MASTECTOMY WITH AXILLARY SENTINEL LYMPH NODE BIOPSY Left 03/18/2016   Procedure: RADIOACTIVE SEED GUIDED LEFT PARTIAL MASTECTOMY WITH AXILLARY SENTINEL LYMPH NODE BIOPSY AND BLUE DYE INJECTION;  Surgeon: Fanny Skates, MD;  Location: [facility];  Service: General;  Laterality: Left;   Review of Systems  10  point systems review negative except as above.    Objective:   Physical Exam  BP (!) 106/54   Pulse (!) 108   Temp 97.4 F (36.3 C)   Resp 16   Ht 5' 4"  (1.626 m)   Wt 159 lb (72.1 kg)   BMI 27.29 kg/m   HEENT - Eac's patent. TM's Nl. EOM's full. PERRLA. NasoOroPharynx clear. Neck - supple. Nl Thyroid. Carotids 2+ & No bruits, nodes, JVD Chest - Clear equal BS w/o Rales, rhonchi, wheezes. Cor - Nl HS. RRR w/o sig MGR. PP 1(+). No edema. Abd - No palpable organomegaly, masses or tenderness. BS nl. MS- FROM w/o deformities. Muscle power, tone and bulk Nl. Gait Nl. Neuro - No obvious Cr N abnormalities. Sensory, motor and Cerebellar functions appear Nl w/o focal abnormalities. Psyche - Mental status normal & appropriate.  No delusions, ideations or obvious mood abnormalities.  Skin - there is a dystrophic thickened  R 1st toenail which is tender to palpate.   Procedure (CPT - [id number]) - after informed consent and aseptic prep and local anesthesia with 2.5 ml of lidocaine  1% was infiltrated about the R 1st toenail bed. Then the deformed toenail was sharply debrided under traction and the nail bed was trimmed of detritus and granulation tissue. Antibiotic ung and sterile dressing was applied. Patient was instructed in wound care.     Assessment & Plan:   1. Onychomycosis  - terbinafine (LAMISIL) 250 MG tablet; Take 1 tablet (250 mg total) by mouth daily.  Dispense: 90 tablet; Refill: 0  2. Essential hypertension   3. Ingrown right big toenail  - Excised (CPT 11765)

## 2016-08-07 ENCOUNTER — Ambulatory Visit (HOSPITAL_BASED_OUTPATIENT_CLINIC_OR_DEPARTMENT_OTHER): Payer: PPO | Admitting: Hematology and Oncology

## 2016-08-07 ENCOUNTER — Ambulatory Visit: Payer: PPO

## 2016-08-07 ENCOUNTER — Encounter: Payer: Self-pay | Admitting: *Deleted

## 2016-08-07 ENCOUNTER — Other Ambulatory Visit (HOSPITAL_BASED_OUTPATIENT_CLINIC_OR_DEPARTMENT_OTHER): Payer: PPO

## 2016-08-07 ENCOUNTER — Encounter: Payer: Self-pay | Admitting: Hematology and Oncology

## 2016-08-07 DIAGNOSIS — G620 Drug-induced polyneuropathy: Secondary | ICD-10-CM | POA: Insufficient documentation

## 2016-08-07 DIAGNOSIS — C50212 Malignant neoplasm of upper-inner quadrant of left female breast: Secondary | ICD-10-CM

## 2016-08-07 DIAGNOSIS — Z170 Estrogen receptor positive status [ER+]: Secondary | ICD-10-CM | POA: Diagnosis not present

## 2016-08-07 DIAGNOSIS — D6481 Anemia due to antineoplastic chemotherapy: Secondary | ICD-10-CM

## 2016-08-07 DIAGNOSIS — R000 Tachycardia, unspecified: Secondary | ICD-10-CM

## 2016-08-07 DIAGNOSIS — T451X5A Adverse effect of antineoplastic and immunosuppressive drugs, initial encounter: Secondary | ICD-10-CM

## 2016-08-07 DIAGNOSIS — N189 Chronic kidney disease, unspecified: Secondary | ICD-10-CM

## 2016-08-07 LAB — COMPREHENSIVE METABOLIC PANEL
ALT: 28 U/L (ref 0–55)
ANION GAP: 10 meq/L (ref 3–11)
AST: 25 U/L (ref 5–34)
Albumin: 3.4 g/dL — ABNORMAL LOW (ref 3.5–5.0)
Alkaline Phosphatase: 106 U/L (ref 40–150)
BILIRUBIN TOTAL: 0.45 mg/dL (ref 0.20–1.20)
BUN: 18.8 mg/dL (ref 7.0–26.0)
CHLORIDE: 107 meq/L (ref 98–109)
CO2: 24 meq/L (ref 22–29)
CREATININE: 1.6 mg/dL — AB (ref 0.6–1.1)
Calcium: 9.8 mg/dL (ref 8.4–10.4)
EGFR: 30 mL/min/{1.73_m2} — AB (ref >90)
GLUCOSE: 206 mg/dL — AB (ref 70–140)
Potassium: 4.3 mEq/L (ref 3.5–5.1)
SODIUM: 142 meq/L (ref 136–145)
TOTAL PROTEIN: 6.1 g/dL — AB (ref 6.4–8.3)

## 2016-08-07 LAB — CBC WITH DIFFERENTIAL/PLATELET
BASO%: 1.2 % (ref 0.0–2.0)
Basophils Absolute: 0 10*3/uL (ref 0.0–0.1)
EOS%: 1.2 % (ref 0.0–7.0)
Eosinophils Absolute: 0 10*3/uL (ref 0.0–0.5)
HCT: 26.1 % — ABNORMAL LOW (ref 34.8–46.6)
HGB: 8.5 g/dL — ABNORMAL LOW (ref 11.6–15.9)
LYMPH%: 30.8 % (ref 14.0–49.7)
MCH: 33.1 pg (ref 25.1–34.0)
MCHC: 32.6 g/dL (ref 31.5–36.0)
MCV: 101.6 fL — ABNORMAL HIGH (ref 79.5–101.0)
MONO#: 0.3 10*3/uL (ref 0.1–0.9)
MONO%: 9.9 % (ref 0.0–14.0)
NEUT%: 56.9 % (ref 38.4–76.8)
NEUTROS ABS: 2 10*3/uL (ref 1.5–6.5)
NRBC: 3 % — AB (ref 0–0)
Platelets: 184 10*3/uL (ref 145–400)
RBC: 2.57 10*6/uL — AB (ref 3.70–5.45)
RDW: 17.4 % — AB (ref 11.2–14.5)
WBC: 3.4 10*3/uL — AB (ref 3.9–10.3)
lymph#: 1.1 10*3/uL (ref 0.9–3.3)

## 2016-08-07 LAB — TECHNOLOGIST REVIEW

## 2016-08-07 NOTE — Progress Notes (Signed)
Patient Care Team: [name], MD as PCP - General Macarthur Critchley, OD as Referring Physician (Optometry) [name], MD as Consulting Physician (Dermatology) Latanya Maudlin, MD as Consulting Physician (Orthopedic Surgery) Inda Castle, MD as Consulting Physician (Gastroenterology) [name], MD as Consulting Physician (Hematology and Oncology)  DIAGNOSIS: Breast cancer of upper-inner quadrant of left female breast [facility])   Staging form: Breast, AJCC 7th Edition   - Clinical stage from 03/05/2016: Stage IIA (T2, N0, M0) - Unsigned   - Pathologic: Stage IIB (T3, N0, cM0) - Signed by [name], MD on 03/27/2016  SUMMARY OF ONCOLOGIC HISTORY:   Breast cancer of upper-inner quadrant of left female breast ([place])   02/26/2016 Initial Diagnosis    Left breast biopsy 11:00 position: invasive ductal carcinoma with DCIS, ER 90%, PR 10%, HER-2 negative, Ki-67 30%, grade 2, 2.2 cm palpable lesion T2 N0 stage II a clinical stage      03/18/2016 Surgery    Left lumpectomy: Invasive ductal carcinoma, grade 2, 6.3 cm, with high-grade DCIS, margins negative, 0/4 lymph nodes negative, ER 90%, via 10%, HER-2 negative ratio 0.97, Ki-67 30%, T3 N0 stage IIB      03/25/2016 Procedure    Genetic testing is negative for pathogenic mutations within any of the 20 Genes on the breast/ovarian cancer panel      04/04/2016 Oncotype testing    Oncotype DX recurrence score 37, 25% 10 year distant risk of recurrence      04/17/2016 - 07/31/2016 Chemotherapy    Adjuvant chemotherapy with dose dense Adriamycin and Cytoxan followed by Abraxane weekly 8 ( discontinued due to neuropathy)       CHIEF COMPLIANT: Cycle 9 Abraxane being discontinued due to neuropathy  INTERVAL HISTORY: Kayla Baker is a 80 year old with above-mentioned history of left breast cancer currently on adjuvant chemotherapy and today supposed to be cycle 9 of Abraxane. Over the past several weeks she has noticed increased tingling and  numbness in the sides of the foot accompanied by the leg swelling. The leg swelling has improved with Lasix. But the neuropathy symptoms have persisted. She continues to feel fatigued.  REVIEW OF SYSTEMS:   Constitutional: Denies fevers, chills or abnormal weight loss Eyes: Denies blurriness of vision Ears, nose, mouth, throat, and face: Denies mucositis or sore throat Respiratory: Denies cough, dyspnea or wheezes Cardiovascular: Denies palpitation, chest discomfort Gastrointestinal:  Denies nausea, heartburn or change in bowel habits Skin: Denies abnormal skin rashes Lymphatics: Denies new lymphadenopathy or easy bruising Neurological:numbness in the bottom of the feet Behavioral/Psych: Mood is stable, no new changes  Extremities: lower extremity edema  All other systems were reviewed with the patient and are negative.  I have reviewed the past medical history, past surgical history, social history and family history with the patient and they are unchanged from previous note.  ALLERGIES:  is allergic to keflex [cephalexin]; meloxicam; prednisone; premarin [estrogens conjugated]; and trovan [alatrofloxacin].  MEDICATIONS:  Current Outpatient Prescriptions  Medication Sig Dispense Refill  . allopurinol (ZYLOPRIM) 300 MG tablet TAKE 1 TABLET BY MOUTH EVERY DAY 90 tablet 1  . amitriptyline (ELAVIL) 25 MG tablet Take 1 to 2 tablets at bedtime for neuritis 180 tablet 1  . aspirin 81 MG tablet Take 81 mg by mouth daily.    Marland Kitchen atorvastatin (LIPITOR) 40 MG tablet TAKE 1 TABLET BY MOUTH EVERY DAY 90 tablet 1  . chlorhexidine (HIBICLENS) 4 % external liquid Apply 15 mLs (1 application total) topically once. 120 mL 0  .  enalapril (VASOTEC) 20 MG tablet TAKE 1 TABLET BY MOUTH DAILY 90 tablet 0  . furosemide (LASIX) 20 MG tablet Take 2 tablets (40 mg total) by mouth daily. 30 tablet 2  . gabapentin (NEURONTIN) 100 MG capsule Take 1 to 3 capsules at bedtime for neuritis 90 capsule 5  . glucose  blood (FREESTYLE LITE) test strip USE ONE STRIP TO CHECK GLUCOSE ONCE DAILY. DX-E11.22 100 each 3  . lactated ringers infusion Inject 125 mLs into the vein continuous. 250 mL 0  . Lancets (FREESTYLE) lancets Check blood sugar one time daily-E11.22 100 each 3  . LORazepam (ATIVAN) 0.5 MG tablet Take 0.5 mg by mouth 3 times/day as needed-between meals & bedtime for anxiety or sleep.    . metoprolol succinate (TOPROL XL) 25 MG 24 hr tablet Take 1 tablet (25 mg total) by mouth daily. 30 tablet 3  . ondansetron (ZOFRAN) 8 MG tablet   1  . prochlorperazine (COMPAZINE) 10 MG tablet     . ranitidine (ZANTAC) 300 MG tablet Take twice a day as needed for Heartburn & Acid Indigestion 60 tablet 3  . terbinafine (LAMISIL) 250 MG tablet Take 1 tablet (250 mg total) by mouth daily. 90 tablet 0  . vitamin C (ASCORBIC ACID) 500 MG tablet Take 500 mg by mouth daily. Takes 2 tablets daily     No current facility-administered medications for this visit.     PHYSICAL EXAMINATION: ECOG PERFORMANCE STATUS: 1 - Symptomatic but completely ambulatory  Vitals:   08/07/16 0952  BP: (!) 105/58  Pulse: 90  Resp: 18  Temp: 98 F (36.7 C)   Filed Weights   08/07/16 0952  Weight: 160 lb 4.8 oz (72.7 kg)    GENERAL:alert, no distress and comfortable SKIN: skin color, texture, turgor are normal, no rashes or significant lesions EYES: normal, Conjunctiva are pink and non-injected, sclera clear OROPHARYNX:no exudate, no erythema and lips, buccal mucosa, and tongue normal  NECK: supple, thyroid normal size, non-tender, without nodularity LYMPH:  no palpable lymphadenopathy in the cervical, axillary or inguinal LUNGS: clear to auscultation and percussion with normal breathing effort HEART: regular rate & rhythm and no murmurs and no lower extremity edema ABDOMEN:abdomen soft, non-tender and normal bowel sounds MUSCULOSKELETAL:no cyanosis of digits and no clubbing  NEURO: alert & oriented x 3 with fluent speech,  grade 2 peripheral neuropathy EXTREMITIES: 1+ lower extremity edema   LABORATORY DATA:  I have reviewed the data as listed   Chemistry      Component Value Date/Time   NA 142 08/07/2016 0918   K 4.3 08/07/2016 0918   CL 103 04/02/2016 0940   CO2 24 08/07/2016 0918   BUN 18.8 08/07/2016 0918   CREATININE 1.6 (H) 08/07/2016 0918      Component Value Date/Time   CALCIUM 9.8 08/07/2016 0918   ALKPHOS 106 08/07/2016 0918   AST 25 08/07/2016 0918   ALT 28 08/07/2016 0918   BILITOT 0.45 08/07/2016 0918       Lab Results  Component Value Date   WBC 3.4 (L) 08/07/2016   HGB 8.5 (L) 08/07/2016   HCT 26.1 (L) 08/07/2016   MCV 101.6 (H) 08/07/2016   PLT 184 08/07/2016   NEUTROABS 2.0 08/07/2016     ASSESSMENT & PLAN:  Breast cancer of upper-inner quadrant of left female breast ([address]) Left lumpectomy 03/08/2016: Invasive ductal carcinoma, grade 2, 6.3 cm, with high-grade DCIS, margins negative, 0/4 lymph nodes negative, ER 90%, via 10%, HER-2 negative ratio 0.97, Ki-67  30%, T3 N0 stage IIB  Oncotype DX counseling: Patient has a score of 37 which translates into 25% 10 year risk of distant recurrence with tamoxifen alone. Based on this, I strongly recommend systemic chemotherapy. Adjuvant chemotherapy with dose dense Adriamycin and Cytoxan 4 followed by Abraxane weekly 8 ( stopped due to neuropathy) 04/17/2016 to 07/31/2016  Treatment plan: 1. Adjuvant radiation therapy followed by 2. Adjuvant antiestrogen therapy ----------------------------------------------------------------------------------------------  Chemotherapy toxicities: 1. Fatigue due to chemotherapy  2. Elevated ALT: Not due to chemotherapy, we will continue to watch and monitor this 3. Chemotherapy-induced anemia: Hemoglobin 8.2. Currently on oral iron supplementation twice a day 4. Lower extremity edema: Lasix to 40 mg daily. Encouraged her to eat more potassium containing foods. 5. Tachycardia: Probably  related to anemia.Currently on Toprol-XL. Slight improvement of the heart rate as her hemoglobin has improved 6. Skin rash: Fixed drug eruption: decreased the dosage of Abraxane from cycle 4. Rash has improved. 7. Chronic kidney disease: Creatinine is 1.6. We will have to monitor this closely because of Lasix. 8. Chemotherapy-induced peripheral neuropathy: Grade 2, discontinued chemotherapy 07/31/2016 is the last treatment.  Patient does exercise by walking frequently. Return to clinic in 3 months after radiation is complete to start antiestrogen therapy.   Orders Placed This Encounter  Procedures  . CBC with Differential    Standing Status:   Future    Standing Expiration Date:   08/07/2017  . Comprehensive metabolic panel    Standing Status:   Future    Standing Expiration Date:   08/07/2017  . Ambulatory referral to Radiation Oncology    Referral Priority:   Routine    Referral Type:   Consultation    Referral Reason:   Specialty Services Required    Referred to Provider:   Kyung Rudd, MD    Requested Specialty:   Radiation Oncology    Number of Visits Requested:   1   The patient has a good understanding of the overall plan. she agrees with it. she will call with any problems that may develop before the next visit here.   Rulon Eisenmenger, MD 08/07/16

## 2016-08-07 NOTE — Assessment & Plan Note (Signed)
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 Based on this, I strongly recommend systemic chemotherapy.  Treatment plan: 1. Adjuvant chemotherapy with dose dense Adriamycin and Cytoxan 4 followed by Abraxane weekly 12 2. Adjuvant radiation therapy followed by 3. Adjuvant antiestrogen therapy ---------------------------------------------------------------------------------------------- Current treatment: completed 4 cycles of dose dense Adriamycin and Cytoxan (started at a slightly lower dose given her age), Today is Abrance cycle 8/12 Echocardiogram revealed EF of 60-65% on 04/16/2016  Chemotherapy toxicities: 1. Fatigue due to chemotherapy  2. Elevated ALT: Not due to chemotherapy, we will continue to watch and monitor this 3. Chemotherapy-induced anemia: Hemoglobin 8.2. Currently on oral iron supplementation twice a day 4. Lower extremity edema: Increased Lasix to 40 mg daily. Encouraged her to eat more potassium containing foods. 5. Tachycardia: Probably related to anemia.Currently on Toprol-XL. Continues to have the same problem  6. Skin rash: Fixed drug eruption: decreased the dosage of Abraxane from cycle 4. Rash has improved. 7. Chronic kidney disease: Creatinine is 1.4. We will have to monitor this closely because of Lasix.  Patient does exercise by walking frequently. She denies any neuropathy. Return to clinic in 2weeks for follow up.

## 2016-08-11 NOTE — Progress Notes (Signed)
Radiation Oncology         [phone] ________________________________  Name: Kayla Baker MRN: [id number]  Date: 08/13/2016  DOB: 1936-11-28  KZ:[id number] DAVID, MD  [name], MD     REFERRING PHYSICIAN: [name], MD   DIAGNOSIS: The encounter diagnosis was Breast cancer of upper-inner quadrant of left female breast ([place]).  Breast cancer of upper-inner quadrant of left female breast [facility])   Staging form: Breast, AJCC 7th Edition   - Clinical stage from 03/05/2016: Stage IIA (T2, N0, M0) - Unsigned   - Pathologic: Stage IIB (T3, N0, cM0) - Signed by [name], MD on 03/27/2016   HISTORY OF PRESENT ILLNESS:Kayla Baker is a 80 y.o. female with a history of of breast cancer.  The patient was found to have suspicious findings within the left breast on initial mammogram. A diagnostic mammogram and breast ultrasound confirmed this finding. On ultrasound, the tumor measured 2.2 cm and was present in the upper-inner quadrant. A biopsy was performed. This revealed invasive ductal carcinoma with DCIS, ER 90%, PR 10%, HER-2 negative, Ki-67 30%, compatible with grade 2 breast carcinoma.  The patient has not undergone an MRI scan of the breasts.  The patient proceeded to undergo surgical resection of the breast cancer. This corresponded to a stage IIB T3 N0. Pathology revealed a grade 2 invasive ductal carcinoma spanning 6.3 cm, as well as high grade DCIS. The margins were negative. In terms of lymph nodes, all were negative. Receptor studies indicated that the tumor is estrogen receptor 90%, progesterone receptor 10%, and HER-2/neu negative. The Ki-67 staining was 30%.  An Oncotype test was ordered and was 37, giving her a 25% risk within 10 years of distant recurrence. Se also had genetic testing which was negative. She began dose dense adriamycin and cytoxan with weekly abraxane x 8 which she discontinued due to neuropathy on 07/31/16. She comes today in anticipation of radiotherapy  with Dr. Lisbeth Renshaw and will begin estrogen blockade following radiation.  PREVIOUS RADIATION THERAPY: No   PAST MEDICAL HISTORY:  Past Medical History:  Diagnosis Date  . Allergy   . Anemia   . Arthritis   . Breast cancer of upper-inner quadrant of left female breast ([place]) 02/29/2016   skin- 2016- squamous- on right upper arm  . Diet-controlled type 2 diabetes mellitus ([place])    diet and exercsise,Has never been on meds  . Diverticulosis   . GERD (gastroesophageal reflux disease)   . Gout    takes Allopurinol daily  . History of colon polyps    benign  . History of shingles   . Hyperlipidemia    takes Atorvastatin daily  . Hypertension    takes Enalapril and HCTZ daily  . Peripheral neuropathy (HCC)    takes Gabapentin and Elavil daily  . PONV (postoperative nausea and vomiting)   . Vitamin D deficiency        PAST SURGICAL HISTORY: Past Surgical History:  Procedure Laterality Date  . ABDOMINAL HYSTERECTOMY  1973  . CARDIAC CATHETERIZATION    . CATARACT EXTRACTION, BILATERAL  2014   right eye 2/14; left eye 3/3  . COLONOSCOPY    . HAND SURGERY Left 2012  . INCONTINENCE SURGERY  2006  . KNEE ARTHROSCOPY Right   . PORTACATH PLACEMENT N/A 04/14/2016   Procedure: INSERTION PORT-A-CATH ;  Surgeon: Fanny Skates, MD;  Location: [place];  Service: General;  Laterality: N/A;  . RADIOACTIVE SEED GUIDED MASTECTOMY WITH AXILLARY SENTINEL LYMPH NODE BIOPSY Left  03/18/2016   Procedure: RADIOACTIVE SEED GUIDED LEFT PARTIAL MASTECTOMY WITH AXILLARY SENTINEL LYMPH NODE BIOPSY AND BLUE DYE INJECTION;  Surgeon: Fanny Skates, MD;  Location: [facility];  Service: General;  Laterality: Left;     FAMILY HISTORY:  Family History  Problem Relation Age of Onset  . Stroke Mother 63  . Other Mother     history of hysterectomy after last childbirth  . Diabetes Father   . Alzheimer's disease Father   . Hypertension Sister   . Lung cancer Sister 29    smoker  . Other Sister     history of  hysterectomy for fibroids  . Breast cancer Sister 68  . Esophageal cancer Maternal Aunt 74    not a smoker  . Heart attack Maternal Uncle 65  . Cirrhosis Sister   . Lung cancer Other     nephew dx. 44s; +smoker  . Epilepsy Other   . Other Other 12    great niece dx. benign ganglioglioma brain tumor; [facility]  . Epilepsy Other     no seizures in 2 years  . Parkinson's disease Maternal Aunt   . Stroke Maternal Grandmother   . Heart Problems Maternal Grandmother   . Pancreatic cancer Cousin 1    paternal 1st cousin  . Colon cancer Neg Hx   . Stomach cancer Neg Hx      SOCIAL HISTORY:  reports that she has never smoked. She has never used smokeless tobacco. She reports that she does not drink alcohol or use drugs. The patient is married and resides in [place].    ALLERGIES: Keflex [cephalexin]; Meloxicam; Prednisone; Premarin [estrogens conjugated]; and Trovan [alatrofloxacin]   MEDICATIONS:  Current Outpatient Prescriptions  Medication Sig Dispense Refill  . allopurinol (ZYLOPRIM) 300 MG tablet TAKE 1 TABLET BY MOUTH EVERY DAY 90 tablet 1  . aspirin 81 MG tablet Take 81 mg by mouth daily.    Marland Kitchen atorvastatin (LIPITOR) 40 MG tablet TAKE 1 TABLET BY MOUTH EVERY DAY 90 tablet 1  . furosemide (LASIX) 20 MG tablet Take 2 tablets (40 mg total) by mouth daily. 30 tablet 2  . glucose blood (FREESTYLE LITE) test strip USE ONE STRIP TO CHECK GLUCOSE ONCE DAILY. DX-E11.22 100 each 3  . Lancets (FREESTYLE) lancets Check blood sugar one time daily-E11.22 100 each 3  . LORazepam (ATIVAN) 0.5 MG tablet Take 0.5 mg by mouth 3 times/day as needed-between meals & bedtime for anxiety or sleep.    . metoprolol succinate (TOPROL XL) 25 MG 24 hr tablet Take 1 tablet (25 mg total) by mouth daily. 30 tablet 3  . ranitidine (ZANTAC) 300 MG tablet Take twice a day as needed for Heartburn & Acid Indigestion 60 tablet 3  . terbinafine (LAMISIL) 250 MG tablet Take 1 tablet (250 mg total) by mouth  daily. 90 tablet 0  . vitamin C (ASCORBIC ACID) 500 MG tablet Take 500 mg by mouth daily. Takes 2 tablets daily    . amitriptyline (ELAVIL) 25 MG tablet Take 1 to 2 tablets at bedtime for neuritis 180 tablet 1  . chlorhexidine (HIBICLENS) 4 % external liquid Apply 15 mLs (1 application total) topically once. (Patient not taking: Reported on 08/13/2016) 120 mL 0  . enalapril (VASOTEC) 20 MG tablet TAKE 1 TABLET BY MOUTH DAILY (Patient not taking: Reported on 08/13/2016) 90 tablet 0  . gabapentin (NEURONTIN) 100 MG capsule Take 1 to 3 capsules at bedtime for neuritis 90 capsule 5  . lactated ringers infusion  Inject 125 mLs into the vein continuous. (Patient not taking: Reported on 08/13/2016) 250 mL 0  . ondansetron (ZOFRAN) 8 MG tablet   1  . prochlorperazine (COMPAZINE) 10 MG tablet      No current facility-administered medications for this encounter.      REVIEW OF SYSTEMS: On review of systems, the patient reports that she is doing well overall. She denies any chest pain, shortness of breath, cough, fevers, chills, night sweats, unintended weight changes. She denies any bowel or bladder disturbances, and denies abdominal pain, nausea or vomiting. She denies any new musculoskeletal or joint aches or pains. A complete review of systems is obtained and is otherwise negative.     PHYSICAL EXAM:  height is 5' 4"  (1.626 m) and weight is 164 lb (74.4 kg). Her oral temperature is 98.3 F (36.8 C). Her blood pressure is 125/76 and her pulse is 91. Her respiration is 18 and oxygen saturation is 98%.   Pain scale 0/10 In general this is a well appearing caucasian female in no acute distress. She is alert and oriented x4 and appropriate throughout the examination. HEENT reveals that the patient is normocephalic, atraumatic. EOMs are intact. PERRLA. Skin is intact without any evidence of gross lesions. Cardiovascular exam reveals a regular rate and rhythm, no clicks rubs or murmurs are auscultated. Chest is  clear to auscultation bilaterally. Lymphatic assessment is performed and does not reveal any adenopathy in the cervical, supraclavicular, axillary, or inguinal chains. Abdomen has active bowel sounds in all quadrants and is intact. The abdomen is soft, non tender, non distended. Lower extremities are negative for pretibial pitting edema, deep calf tenderness, cyanosis or clubbing.   ECOG = 1  0 - Asymptomatic (Fully active, able to carry on all predisease activities without restriction)  1 - Symptomatic but completely ambulatory (Restricted in physically strenuous activity but ambulatory and able to carry out work of a light or sedentary nature. For example, light housework, office work)  2 - Symptomatic, <50% in bed during the day (Ambulatory and capable of all self care but unable to carry out any work activities. Up and about more than 50% of waking hours)  3 - Symptomatic, >50% in bed, but not bedbound (Capable of only limited self-care, confined to bed or chair 50% or more of waking hours)  4 - Bedbound (Completely disabled. Cannot carry on any self-care. Totally confined to bed or chair)  5 - Death   Eustace Pen MM, Creech RH, Tormey DC, et al. [id number]). "Toxicity and response criteria of the [facility]". [place] Oncol. 5 (6): 649-55    LABORATORY DATA:  Lab Results  Component Value Date   WBC 3.4 (L) 08/07/2016   HGB 8.5 (L) 08/07/2016   HCT 26.1 (L) 08/07/2016   MCV 101.6 (H) 08/07/2016   PLT 184 08/07/2016   Lab Results  Component Value Date   NA 142 08/07/2016   K 4.3 08/07/2016   CL 103 04/02/2016   CO2 24 08/07/2016   Lab Results  Component Value Date   ALT 28 08/07/2016   AST 25 08/07/2016   ALKPHOS 106 08/07/2016   BILITOT 0.45 08/07/2016      RADIOGRAPHY: No results found.     IMPRESSION/PLAN: 1. Stage IIB T3 N0, ER  Positive, grade 2 invasive ductal carcinoma of the left breast. Dr. Lisbeth Renshaw discusses the pathology findings and  reviews the nature of invasive breast disease. He again reviews the rational for 4 weeks of  radiotherapy. We will proceed with breath hold technique. We discussed the risks, benefits, short, and long term effects of radiotherapy, and the patient is interested in proceeding. Dr. Lisbeth Renshaw discusses the delivery and logistics of radiotherapy. We will move forward with the simulation and planning process next Friday, and anticipate starting radiotherapy in the next few weeks. 2. Lower extremity edema. The patient denies any cardiac symptoms. We will follow up with cardiology's recommendations when she is seen.     The above documentation reflects my direct findings during this shared patient visit. Please see the separate note by Dr. Lisbeth Renshaw on this date for the remainder of the patient's plan of care.    Carola Rhine, PAC

## 2016-08-11 NOTE — Progress Notes (Signed)
Location of Breast Cancer: Left Breast Upper Inner Quadrant   Histology per Pathology Report: Diagnosis 02/26/2016: Breast, left, needle core biopsy, 11:00 o'clock - INVASIVE DUCTAL CARCINOMA.- DUCTAL CARCINOMA IN SITU.  Receptor Status: ER( 90%+  ), PR ( 10%+  ), Her2-neu ( neg  ), Ki-67(  30% )  Did patient present with symptoms (if so, please note symptoms) or was this found on screening mammography?:   Past/Anticipated interventions by surgeon, if TXM:IWOEHOZYY 03/18/16: 1. Breast, lumpectomy, Left - INVASIVE DUCTAL CARCINOMA, GRADE 2, SPANNING 6.3 CM.- DUCTAL CARCINOMA IN SITU, HIGH GRADE.- RESECTION MARGINS ARE NEGATIVE FOR CARCINOMA. - BIOPSY SITE.- MARKED FIBROCYSTIC CHANGE.- SEE ONCOLOGY TABLE. 2. Lymph node, sentinel, biopsy, Left axilla #1 - ONE OF ONE LYMPH NODES NEGATIVE FOR CARCINOMA (0/1). 3. Lymph node, sentinel, biopsy, Left axilla #2 - ONE OF ONE LYMPH NODES NEGATIVE FOR CARCINOMA (0/1). Diagnosis(continued) 4. Lymph node, sentinel, biopsy, Left axilla #3 - ONE OF ONE LYMPH NODES NEGATIVE FOR CARCINOMA (0/1). 5. Fatty tissue, Left additional axillary tissue - ONE OF ONE LYMPH NODES NEGATIVE FOR CARCINOMA (0/1).  Past/Anticipated interventions by medical oncology, if any: Chemotherapy :[name] 9/7/217: Oncotype DX counseling: Patient has a score of 37 which translates into 25% 10 year risk of distant recurrence with tamoxifen alone.  this, I  Genetic testing neg for pathogenic mutations w/i any of the 20 genes on the breast/ovarian panel  Treatment plan: 1. Adjuvant chemotherapy with dose dense Adriamycin and Cytoxan 4 followed by Abraxane weekly 12 2. Adjuvant radiation therapy followed by 3. Adjuvant antiestrogen therapy ---------------------------------------------------------------------------------------------- Current treatment: completed 4 cycles of dose dense Adriamycin and Cytoxan (started at a slightly lower dose given her age), Today is Abrance cycle  8/12 Echocardiogram revealed EF of 60-65% on 04/16/2016   Lymphedema issues, if any:   Feet swelling past 4 weeks, on Lasix   Pain issues, if any:  No  SAFETY ISSUES: NO  Prior radiation? NO  Pacemaker/ICD? NO  Possible current pregnancy? N/A  Is the patient on methotrexate? NO  Current Complaints / other details:  Seen Breast Clinic 03/05/16 BP 125/76 (BP Location: Right Arm, Patient Position: Sitting, Cuff Size: Normal)   Pulse 91   Temp 98.3 F (36.8 C) (Oral)   Resp 18   Ht 5' 4"  (1.626 m)   Wt 164 lb (74.4 kg)   SpO2 98% Comment: room air  BMI 28.15 kg/m   Wt Readings from Last 3 Encounters:  08/13/16 164 lb (74.4 kg)  08/07/16 160 lb 4.8 oz (72.7 kg)  08/01/16 159 lb (72.1 kg)      Kayla Eaton, RN 08/11/2016,9:12 AM

## 2016-08-13 ENCOUNTER — Ambulatory Visit
Admission: RE | Admit: 2016-08-13 | Discharge: 2016-08-13 | Disposition: A | Discharge status: Home / Self Care | Payer: PPO | Source: Ambulatory Visit | Attending: Radiation Oncology | Admitting: Radiation Oncology

## 2016-08-13 ENCOUNTER — Encounter: Payer: Self-pay | Admitting: Radiation Oncology

## 2016-08-13 VITALS — BP 125/76 | HR 91 | Temp 98.3°F | Resp 18 | Ht 64.0 in | Wt 164.0 lb

## 2016-08-13 DIAGNOSIS — Z510 Encounter for antineoplastic radiation therapy: Secondary | ICD-10-CM | POA: Diagnosis not present

## 2016-08-13 DIAGNOSIS — C50212 Malignant neoplasm of upper-inner quadrant of left female breast: Secondary | ICD-10-CM | POA: Diagnosis not present

## 2016-08-13 DIAGNOSIS — Z9221 Personal history of antineoplastic chemotherapy: Secondary | ICD-10-CM | POA: Diagnosis not present

## 2016-08-13 DIAGNOSIS — Z170 Estrogen receptor positive status [ER+]: Secondary | ICD-10-CM | POA: Diagnosis not present

## 2016-08-13 DIAGNOSIS — Z9889 Other specified postprocedural states: Secondary | ICD-10-CM | POA: Diagnosis not present

## 2016-08-14 ENCOUNTER — Ambulatory Visit: Payer: PPO

## 2016-08-14 ENCOUNTER — Other Ambulatory Visit: Payer: Self-pay | Admitting: Internal Medicine

## 2016-08-14 ENCOUNTER — Other Ambulatory Visit: Payer: PPO

## 2016-08-14 DIAGNOSIS — E1140 Type 2 diabetes mellitus with diabetic neuropathy, unspecified: Secondary | ICD-10-CM

## 2016-08-21 ENCOUNTER — Ambulatory Visit: Payer: PPO

## 2016-08-21 ENCOUNTER — Other Ambulatory Visit: Payer: PPO

## 2016-08-21 ENCOUNTER — Ambulatory Visit: Payer: PPO | Admitting: Hematology and Oncology

## 2016-08-22 ENCOUNTER — Ambulatory Visit: Payer: Self-pay | Admitting: Internal Medicine

## 2016-08-22 ENCOUNTER — Ambulatory Visit
Admission: RE | Admit: 2016-08-22 | Discharge: 2016-08-22 | Disposition: A | Discharge status: Home / Self Care | Payer: PPO | Source: Ambulatory Visit | Attending: Radiation Oncology | Admitting: Radiation Oncology

## 2016-08-22 DIAGNOSIS — C50212 Malignant neoplasm of upper-inner quadrant of left female breast: Secondary | ICD-10-CM | POA: Diagnosis not present

## 2016-08-22 DIAGNOSIS — Z510 Encounter for antineoplastic radiation therapy: Secondary | ICD-10-CM | POA: Diagnosis not present

## 2016-08-27 ENCOUNTER — Other Ambulatory Visit (HOSPITAL_COMMUNITY): Payer: Self-pay | Admitting: Cardiology

## 2016-08-27 ENCOUNTER — Ambulatory Visit (HOSPITAL_COMMUNITY)
Admission: RE | Admit: 2016-08-27 | Discharge: 2016-08-27 | Disposition: A | Discharge status: Home / Self Care | Payer: PPO | Source: Ambulatory Visit | Attending: Internal Medicine | Admitting: Internal Medicine

## 2016-08-27 ENCOUNTER — Encounter (HOSPITAL_COMMUNITY): Payer: Self-pay

## 2016-08-27 ENCOUNTER — Ambulatory Visit (HOSPITAL_BASED_OUTPATIENT_CLINIC_OR_DEPARTMENT_OTHER)
Admission: RE | Admit: 2016-08-27 | Discharge: 2016-08-27 | Disposition: A | Discharge status: Home / Self Care | Payer: PPO | Source: Ambulatory Visit | Attending: Cardiology | Admitting: Cardiology

## 2016-08-27 VITALS — BP 120/72 | HR 84 | Wt 163.2 lb

## 2016-08-27 DIAGNOSIS — I10 Essential (primary) hypertension: Secondary | ICD-10-CM

## 2016-08-27 DIAGNOSIS — C50212 Malignant neoplasm of upper-inner quadrant of left female breast: Secondary | ICD-10-CM

## 2016-08-27 DIAGNOSIS — M7989 Other specified soft tissue disorders: Secondary | ICD-10-CM | POA: Diagnosis not present

## 2016-08-27 LAB — ECHOCARDIOGRAM LIMITED
E/e' ratio: 9.55
FS: 33 % (ref 28–44)
IV/PV OW: 1.1
LA diam end sys: 30 mm
LA diam index: 1.67 cm/m2
LASIZE: 30 mm
LDCA: 2.54 cm2
LV E/e' medial: 9.55
LV TDI E'LATERAL: 5.33
LV e' LATERAL: 5.33 cm/s
LVEEAVG: 9.55
LVOT diameter: 18 mm
MV pk E vel: 50.9 m/s
MVPKAVEL: 104 m/s
PW: 9.11 mm — AB (ref 0.6–1.1)
TDI e' medial: 5.22
WEIGHTICAEL: 2612 [oz_av]

## 2016-08-27 LAB — BRAIN NATRIURETIC PEPTIDE: B NATRIURETIC PEPTIDE 5: 53.7 pg/mL (ref 0.0–100.0)

## 2016-08-27 NOTE — Patient Instructions (Signed)
Lab today  We will contact you in 6 months to schedule your next appointment.  

## 2016-08-27 NOTE — Progress Notes (Signed)
  Echocardiogram 2D Echocardiogram has been performed.  Kayla Baker 08/27/2016, 11:56 AM

## 2016-08-28 ENCOUNTER — Other Ambulatory Visit: Payer: PPO

## 2016-08-28 ENCOUNTER — Ambulatory Visit: Payer: PPO

## 2016-08-28 DIAGNOSIS — C50212 Malignant neoplasm of upper-inner quadrant of left female breast: Secondary | ICD-10-CM | POA: Diagnosis not present

## 2016-08-28 DIAGNOSIS — Z510 Encounter for antineoplastic radiation therapy: Secondary | ICD-10-CM | POA: Diagnosis not present

## 2016-08-28 NOTE — Progress Notes (Signed)
Patient ID: Kayla Baker, female   DOB: 1936/08/07, 80 y.o.   MRN: [id number]    CARDIO-ONCOLOGY CLINIC CONSULT NOTE  Referring Physician: Lindi Adie   HPI:  Kayla Baker is a 80 y/o woman with HTN, HL, GERD and left breast cancer diagnosed 3/17. ER + PR- HER-2 - referred by [name] for enrollment into the [facility].    DIAGNOSIS: Breast cancer of upper-inner quadrant of left female breast [facility])  Staging form: Breast, AJCC 7th Edition  Clinical stage from 03/05/2016: Stage IIA (T2, N0, M0) - Unsigned  Pathologic: Stage IIB (T3, N0, cM0) - Signed by [name], MD on 03/27/2016   SUMMARY OF ONCOLOGIC HISTORY:   Breast cancer of upper-inner quadrant of left female breast ([place])   02/26/2016 Initial Diagnosis Left breast biopsy 11:00 position: invasive ductal carcinoma with DCIS, ER 90%, PR 10%, HER-2 negative, Ki-67 30%, grade 2, 2.2 cm palpable lesion T2 N0 stage II a clinical stage   03/18/2016 Surgery Left lumpectomy: Invasive ductal carcinoma, grade 2, 6.3 cm, with high-grade DCIS, margins negative, 0/4 lymph nodes negative, ER 90%, via 10%, HER-2 negative ratio 0.97, Ki-67 30%, T3 N0 stage IIB   04/04/2016 Oncotype testing Oncotype DX recurrence score 37, 25% 10 year distant risk of recurrence         Planned treatment:  1. Adjuvant chemotherapy with dose dense Adriamycin and Cytoxan 4 followed by Abraxane weekly 12 2. Adjuvant radiation therapy followed by 3. Adjuvant antiestrogen therapy  Kayla Baker has completed Adriamycin/Cytoxan and Abraxane.  Generally doing ok. Kayla Baker has had some swelling in her feet and ankles.  No dyspnea walking on flat ground, mild dyspnea walking up a flight of stairs.  No chest pain.   - Echo 04/16/16 EF 60-65% Grade I DD. Lateral s' 10.8 cm/s GLS -15.9% (underestimated due to poor tracking) - Echo 6/17: LVEF 60-65%, normal RV, Lateral s' 11.1, GLS -16.3% (Still appears to be underestimated with poor tracking) - Echo 9/17: EF 55-60%,  GLS -14.6% (Still appears to be underestimated with poor tracking)  Past Medical History:  Diagnosis Date  . Allergy   . Anemia   . Arthritis   . Breast cancer of upper-inner quadrant of left female breast ([place]) 02/29/2016   skin- 2016- squamous- on right upper arm  . Diet-controlled type 2 diabetes mellitus ([place])    diet and exercsise,Has never been on meds  . Diverticulosis   . GERD (gastroesophageal reflux disease)   . Gout    takes Allopurinol daily  . History of colon polyps    benign  . History of shingles   . Hyperlipidemia    takes Atorvastatin daily  . Hypertension    takes Enalapril and HCTZ daily  . Peripheral neuropathy (HCC)    takes Gabapentin and Elavil daily  . PONV (postoperative nausea and vomiting)   . Vitamin D deficiency     Current Outpatient Prescriptions  Medication Sig Dispense Refill  . allopurinol (ZYLOPRIM) 300 MG tablet TAKE 1 TABLET BY MOUTH EVERY DAY 90 tablet 1  . amitriptyline (ELAVIL) 25 MG tablet TAKE 1 TO 2 TABLETS BY MOUTH AT BEDTIME FOR NEURITIS 180 tablet 1  . aspirin 81 MG tablet Take 81 mg by mouth daily.    Marland Kitchen atorvastatin (LIPITOR) 40 MG tablet TAKE 1 TABLET BY MOUTH EVERY DAY 90 tablet 1  . furosemide (LASIX) 20 MG tablet Take 2 tablets (40 mg total) by mouth daily. 30 tablet 2  . gabapentin (NEURONTIN) 100 MG capsule Take  1 to 3 capsules at bedtime for neuritis 90 capsule 5  . glucose blood (FREESTYLE LITE) test strip USE ONE STRIP TO CHECK GLUCOSE ONCE DAILY. DX-E11.22 100 each 3  . Lancets (FREESTYLE) lancets Check blood sugar one time daily-E11.22 100 each 3  . metoprolol succinate (TOPROL XL) 25 MG 24 hr tablet Take 1 tablet (25 mg total) by mouth daily. 30 tablet 3  . ranitidine (ZANTAC) 300 MG tablet Take twice a day as needed for Heartburn & Acid Indigestion 60 tablet 3  . terbinafine (LAMISIL) 250 MG tablet Take 1 tablet (250 mg total) by mouth daily. 90 tablet 0  . vitamin C (ASCORBIC ACID) 500 MG tablet Take 500 mg by mouth  daily. Takes 2 tablets daily    . chlorhexidine (HIBICLENS) 4 % external liquid Apply 15 mLs (1 application total) topically once. (Patient not taking: Reported on 08/27/2016) 120 mL 0  . LORazepam (ATIVAN) 0.5 MG tablet Take 0.5 mg by mouth 3 times/day as needed-between meals & bedtime for anxiety or sleep.     No current facility-administered medications for this encounter.     Allergies  Allergen Reactions  . Keflex [Cephalexin] Swelling    Tongue and throat swells  . Meloxicam Swelling  . Prednisone Swelling    Can Not take High doses  . Premarin [Estrogens Conjugated] Hives  . Trovan [Alatrofloxacin] Other (See Comments)    Unknown reaction      Social History   Social History  . Marital status: Married    Spouse name: N/A  . Number of children: N/A  . Years of education: N/A   Occupational History  . Not on file.   Social History Main Topics  . Smoking status: Never Smoker  . Smokeless tobacco: Never Used  . Alcohol use No  . Drug use: No  . Sexual activity: Not on file   Other Topics Concern  . Not on file   Social History Narrative  . No narrative on file      Family History  Problem Relation Age of Onset  . Stroke Mother 65  . Other Mother     history of hysterectomy after last childbirth  . Diabetes Father   . Alzheimer's disease Father   . Hypertension Sister   . Lung cancer Sister 84    smoker  . Other Sister     history of hysterectomy for fibroids  . Breast cancer Sister 74  . Esophageal cancer Maternal Aunt 74    not a smoker  . Heart attack Maternal Uncle 65  . Cirrhosis Sister   . Lung cancer Other     nephew dx. 62s; +smoker  . Epilepsy Other   . Other Other 12    great niece dx. benign ganglioglioma brain tumor; treated at [facility]  . Epilepsy Other     no seizures in 2 years  . Parkinson's disease Maternal Aunt   . Stroke Maternal Grandmother   . Heart Problems Maternal Grandmother   . Pancreatic cancer Cousin 86    paternal 1st  cousin  . Colon cancer Neg Hx   . Stomach cancer Neg Hx     Vitals:   08/27/16 1138  BP: 120/72  Pulse: 84  SpO2: 97%  Weight: 163 lb 4 oz (74 kg)    PHYSICAL EXAM: General:  Well appearing. No respiratory difficulty HEENT: normal Neck: supple. no JVD.+ port Carotids 2+ bilat; no bruits. No lymphadenopathy or thyromegaly noted Cor: PMI nondisplaced. RRR, No M/G/R  Lungs: CTAB, normal effort Abdomen: soft, NT, ND, no HSM. No bruits or masses. +BS  Extremities: no cyanosis, clubbing, rash. 1+ ankle edema.   Neuro: alert & oriented x 3, cranial nerves grossly intact. moves all 4 extremities w/o difficulty. Affect pleasant.   ASSESSMENT & PLAN: 1. Left breast CA : Kayla Baker has completed Adriamycin-based chemotherapy. I reviewed today's echo: EF remains normal, GLS is low. However, GLS has been low as well on prior echoes. Study was technically difficult and strain did not appear to track particularly well. - Repeat echo in 6 months with followup to catch any late cardiac effects from Adriamycin.  2. HTN : BP stable.  3. Peripheral edema: No JVD, so most likely venous insufficiency.  Kayla Baker is on Lasix, would not increase. I will check a BNP and will give her a prescription for knee-high compression stockings.   Loralie Champagne 08/28/2016

## 2016-08-29 ENCOUNTER — Ambulatory Visit
Admission: RE | Admit: 2016-08-29 | Discharge: 2016-08-29 | Disposition: A | Discharge status: Home / Self Care | Payer: PPO | Source: Ambulatory Visit | Attending: Radiation Oncology | Admitting: Radiation Oncology

## 2016-08-29 DIAGNOSIS — Z510 Encounter for antineoplastic radiation therapy: Secondary | ICD-10-CM | POA: Diagnosis not present

## 2016-08-29 DIAGNOSIS — C50212 Malignant neoplasm of upper-inner quadrant of left female breast: Secondary | ICD-10-CM | POA: Diagnosis not present

## 2016-08-29 NOTE — Progress Notes (Signed)
  Radiation Oncology         [phone] ________________________________  Name: ANBER MCKIVER MRN: [id number]  Date: 08/22/2016  DOB: 10/19/1936   DIAGNOSIS:     ICD-9-CM ICD-10-CM   1. Breast cancer of upper-inner quadrant of left female breast ([place]) 174.2 C50.212     SIMULATION AND TREATMENT PLANNING NOTE  The patient presented for simulation prior to beginning her course of radiation treatment for her diagnosis of Left-sided breast cancer. The patient was placed in a supine position on a breast board. A customized vac-lock bag was constructed and this complex treatment device will be used on a daily basis during her treatment. In this fashion, a CT scan was obtained through the chest area and an isocenter was placed near the chest wall within the breast.  The patient will be planned to receive a course of radiation initially to a dose of 42.5 Gy. This will consist of a whole breast radiotherapy technique. To accomplish this, 2 customized blocks have been designed which will correspond to medial and lateral whole breast tangent fields. This treatment will be accomplished at 2.5 Gy per fraction. A forward planning technique will also be evaluated to determine if this approach improves the plan. It is anticipated that the patient will then receive a 7.5 Gy boost to the seroma cavity which has been contoured. This will be accomplished at 2.5 Gy per fraction.   This initial treatment will consist of a 3-D conformal technique. The seroma has been contoured as the primary target structure. Additionally, dose volume histograms of both this target as well as the lungs and heart will also be evaluated. Such an approach is necessary to ensure that the target area is adequately covered while the nearby critical  normal structures are adequately spared.  Plan:  The final anticipated total dose therefore will correspond to 50 Gy.    _______________________________   Jodelle Gross, MD, PhD

## 2016-08-29 NOTE — Progress Notes (Signed)
  Radiation Oncology         [phone] ________________________________  Name: APOLLONIA AMINI MRN: [id number]  Date: 08/22/2016  DOB: 1936-07-19  Optical Surface Tracking Plan:  Since intensity modulated radiotherapy (IMRT) and 3D conformal radiation treatment methods are predicated on accurate and precise positioning for treatment, intrafraction motion monitoring is medically necessary to ensure accurate and safe treatment delivery.  The ability to quantify intrafraction motion without excessive ionizing radiation dose can only be performed with optical surface tracking. Accordingly, surface imaging offers the opportunity to obtain 3D measurements of patient position throughout IMRT and 3D treatments without excessive radiation exposure.  I am ordering optical surface tracking for this patient's upcoming course of radiotherapy. ________________________________  Kyung Rudd, MD 08/29/2016 8:45 AM    Reference:   Particia Jasper, et al. Surface imaging-based analysis of intrafraction motion for breast radiotherapy patients.Journal of [place], n. 6, nov. 2014. ISSN [id number].   Available at: <http://www.jacmp.org/index.php/jacmp/article/view/4957>.

## 2016-09-01 ENCOUNTER — Ambulatory Visit
Admission: RE | Admit: 2016-09-01 | Discharge: 2016-09-01 | Disposition: A | Discharge status: Home / Self Care | Payer: PPO | Source: Ambulatory Visit | Attending: Radiation Oncology | Admitting: Radiation Oncology

## 2016-09-01 DIAGNOSIS — C50212 Malignant neoplasm of upper-inner quadrant of left female breast: Secondary | ICD-10-CM | POA: Diagnosis not present

## 2016-09-01 DIAGNOSIS — Z510 Encounter for antineoplastic radiation therapy: Secondary | ICD-10-CM | POA: Diagnosis not present

## 2016-09-02 ENCOUNTER — Ambulatory Visit
Admission: RE | Admit: 2016-09-02 | Discharge: 2016-09-02 | Disposition: A | Discharge status: Home / Self Care | Payer: PPO | Source: Ambulatory Visit | Attending: Radiation Oncology | Admitting: Radiation Oncology

## 2016-09-02 DIAGNOSIS — Z510 Encounter for antineoplastic radiation therapy: Secondary | ICD-10-CM | POA: Diagnosis not present

## 2016-09-02 DIAGNOSIS — C50212 Malignant neoplasm of upper-inner quadrant of left female breast: Secondary | ICD-10-CM | POA: Diagnosis not present

## 2016-09-02 DIAGNOSIS — Z171 Estrogen receptor negative status [ER-]: Secondary | ICD-10-CM

## 2016-09-02 DIAGNOSIS — Z170 Estrogen receptor positive status [ER+]: Secondary | ICD-10-CM | POA: Insufficient documentation

## 2016-09-02 MED ORDER — RADIAPLEXRX EX GEL
Freq: Once | CUTANEOUS | Status: AC
  Administered 2016-09-02: 13:00:00 via TOPICAL

## 2016-09-02 MED ORDER — ALRA NON-METALLIC DEODORANT (RAD-ONC)
1.0000 "application " | Freq: Once | TOPICAL | Status: AC
  Administered 2016-09-02: 1 via TOPICAL

## 2016-09-03 ENCOUNTER — Ambulatory Visit
Admission: RE | Admit: 2016-09-03 | Discharge: 2016-09-03 | Disposition: A | Discharge status: Home / Self Care | Payer: PPO | Source: Ambulatory Visit | Attending: Radiation Oncology | Admitting: Radiation Oncology

## 2016-09-03 DIAGNOSIS — C50212 Malignant neoplasm of upper-inner quadrant of left female breast: Secondary | ICD-10-CM | POA: Diagnosis not present

## 2016-09-03 DIAGNOSIS — Z510 Encounter for antineoplastic radiation therapy: Secondary | ICD-10-CM | POA: Diagnosis not present

## 2016-09-04 ENCOUNTER — Ambulatory Visit
Admission: RE | Admit: 2016-09-04 | Discharge: 2016-09-04 | Disposition: A | Discharge status: Home / Self Care | Payer: PPO | Source: Ambulatory Visit | Attending: Radiation Oncology | Admitting: Radiation Oncology

## 2016-09-04 ENCOUNTER — Encounter: Payer: Self-pay | Admitting: Radiation Oncology

## 2016-09-04 VITALS — BP 122/62 | HR 81 | Temp 97.9°F | Wt 161.4 lb

## 2016-09-04 DIAGNOSIS — Z510 Encounter for antineoplastic radiation therapy: Secondary | ICD-10-CM | POA: Diagnosis not present

## 2016-09-04 DIAGNOSIS — Z170 Estrogen receptor positive status [ER+]: Principal | ICD-10-CM

## 2016-09-04 DIAGNOSIS — C50212 Malignant neoplasm of upper-inner quadrant of left female breast: Secondary | ICD-10-CM

## 2016-09-04 NOTE — Progress Notes (Signed)
Department of Radiation Oncology  Phone:  [phone] Fax:        [phone]  Weekly Treatment Note    Name: Kayla Baker Date: 09/04/2016 MRN: [id number] DOB: 1936-02-07   Diagnosis:     ICD-9-CM ICD-10-CM   1. Malignant neoplasm of upper-inner quadrant of left breast in female, estrogen receptor positive ([place]) 174.2 C50.212    V86.0 Z17.0      Current dose: 10 Gy  Current fraction: 4   MEDICATIONS: Current Outpatient Prescriptions  Medication Sig Dispense Refill  . allopurinol (ZYLOPRIM) 300 MG tablet TAKE 1 TABLET BY MOUTH EVERY DAY 90 tablet 1  . amitriptyline (ELAVIL) 25 MG tablet TAKE 1 TO 2 TABLETS BY MOUTH AT BEDTIME FOR NEURITIS 180 tablet 1  . aspirin 81 MG tablet Take 81 mg by mouth daily.    Marland Kitchen atorvastatin (LIPITOR) 40 MG tablet TAKE 1 TABLET BY MOUTH EVERY DAY 90 tablet 1  . furosemide (LASIX) 20 MG tablet Take 2 tablets (40 mg total) by mouth daily. 30 tablet 2  . gabapentin (NEURONTIN) 100 MG capsule Take 1 to 3 capsules at bedtime for neuritis 90 capsule 5  . glucose blood (FREESTYLE LITE) test strip USE ONE STRIP TO CHECK GLUCOSE ONCE DAILY. DX-E11.22 100 each 3  . hyaluronate sodium (RADIAPLEXRX) GEL Apply 1 application topically 2 (two) times daily.    . Lancets (FREESTYLE) lancets Check blood sugar one time daily-E11.22 100 each 3  . LORazepam (ATIVAN) 0.5 MG tablet Take 0.5 mg by mouth 3 times/day as needed-between meals & bedtime for anxiety or sleep.    . metoprolol succinate (TOPROL XL) 25 MG 24 hr tablet Take 1 tablet (25 mg total) by mouth daily. 30 tablet 3  . non-metallic deodorant (ALRA) MISC Apply 1 application topically daily as needed.    . vitamin C (ASCORBIC ACID) 500 MG tablet Take 500 mg by mouth daily. Takes 2 tablets daily    . ranitidine (ZANTAC) 300 MG tablet Take twice a day as needed for Heartburn & Acid Indigestion (Patient not taking: Reported on 09/04/2016) 60 tablet 3   No current facility-administered medications for this  encounter.      ALLERGIES: Keflex [cephalexin]; Meloxicam; Prednisone; Premarin [estrogens conjugated]; and Trovan [alatrofloxacin]   LABORATORY DATA:  Lab Results  Component Value Date   WBC 3.4 (L) 08/07/2016   HGB 8.5 (L) 08/07/2016   HCT 26.1 (L) 08/07/2016   MCV 101.6 (H) 08/07/2016   PLT 184 08/07/2016   Lab Results  Component Value Date   NA 142 08/07/2016   K 4.3 08/07/2016   CL 103 04/02/2016   CO2 24 08/07/2016   Lab Results  Component Value Date   ALT 28 08/07/2016   AST 25 08/07/2016   ALKPHOS 106 08/07/2016   BILITOT 0.45 08/07/2016     NARRATIVE: Caryl M Guarino was seen today for weekly treatment management. The chart was checked and the patient's films were reviewed.  Ms Castrillon is here for her 4th fraction of radiation to her Left Breast. She denies pain. She has some mild fatigue, but is able to do her normal daily activities. Her Left Breast is slightly red, with some superficial veins present to her upper inner quadrant of her Left Breast. She is using the Radiaplex twice daily.  BP 122/62   Pulse 81   Temp 97.9 F (36.6 C)   Wt 161 lb 6.4 oz (73.2 kg)   SpO2 100% Comment: room air  BMI 27.70 kg/m  Wt Readings from Last 3 Encounters:  09/04/16 161 lb 6.4 oz (73.2 kg)  08/27/16 163 lb 4 oz (74 kg)  08/13/16 164 lb (74.4 kg)    PHYSICAL EXAMINATION: weight is 161 lb 6.4 oz (73.2 kg). Her temperature is 97.9 F (36.6 C). Her blood pressure is 122/62 and her pulse is 81. Her oxygen saturation is 100%.        ASSESSMENT: The patient is doing satisfactorily with treatment.  PLAN: We will continue with the patient's radiation treatment as planned.

## 2016-09-04 NOTE — Progress Notes (Signed)
Kayla Baker is here for her 4th fraction of radiation to her Left Breast. She denies pain. She has some mild fatigue, but is able to do her normal daily activities. Her Left Breast is slightly red, with some superficial veins present to her upper inner quadrant of her Left Breast. She is using the Radiaplex twice daily.  BP 122/62   Pulse 81   Temp 97.9 F (36.6 C)   Wt 161 lb 6.4 oz (73.2 kg)   SpO2 100% Comment: room air  BMI 27.70 kg/m    Wt Readings from Last 3 Encounters:  09/04/16 161 lb 6.4 oz (73.2 kg)  08/27/16 163 lb 4 oz (74 kg)  08/13/16 164 lb (74.4 kg)

## 2016-09-05 ENCOUNTER — Ambulatory Visit
Admission: RE | Admit: 2016-09-05 | Discharge: 2016-09-05 | Disposition: A | Discharge status: Home / Self Care | Payer: PPO | Source: Ambulatory Visit | Attending: Radiation Oncology | Admitting: Radiation Oncology

## 2016-09-05 DIAGNOSIS — C50212 Malignant neoplasm of upper-inner quadrant of left female breast: Secondary | ICD-10-CM | POA: Diagnosis not present

## 2016-09-05 DIAGNOSIS — Z510 Encounter for antineoplastic radiation therapy: Secondary | ICD-10-CM | POA: Diagnosis not present

## 2016-09-07 ENCOUNTER — Telehealth: Payer: Self-pay | Admitting: Hematology and Oncology

## 2016-09-07 NOTE — Telephone Encounter (Signed)
Sw pt, gave appt for 10/19 flush.

## 2016-09-08 ENCOUNTER — Ambulatory Visit
Admission: RE | Admit: 2016-09-08 | Discharge: 2016-09-08 | Disposition: A | Discharge status: Home / Self Care | Payer: PPO | Source: Ambulatory Visit | Attending: Radiation Oncology | Admitting: Radiation Oncology

## 2016-09-08 DIAGNOSIS — C50212 Malignant neoplasm of upper-inner quadrant of left female breast: Secondary | ICD-10-CM | POA: Diagnosis not present

## 2016-09-08 DIAGNOSIS — Z510 Encounter for antineoplastic radiation therapy: Secondary | ICD-10-CM | POA: Diagnosis not present

## 2016-09-09 ENCOUNTER — Ambulatory Visit
Admission: RE | Admit: 2016-09-09 | Discharge: 2016-09-09 | Disposition: A | Discharge status: Home / Self Care | Payer: PPO | Source: Ambulatory Visit | Attending: Radiation Oncology | Admitting: Radiation Oncology

## 2016-09-09 DIAGNOSIS — C50212 Malignant neoplasm of upper-inner quadrant of left female breast: Secondary | ICD-10-CM | POA: Diagnosis not present

## 2016-09-09 DIAGNOSIS — Z510 Encounter for antineoplastic radiation therapy: Secondary | ICD-10-CM | POA: Diagnosis not present

## 2016-09-10 ENCOUNTER — Ambulatory Visit
Admission: RE | Admit: 2016-09-10 | Discharge: 2016-09-10 | Disposition: A | Discharge status: Home / Self Care | Payer: PPO | Source: Ambulatory Visit | Attending: Radiation Oncology | Admitting: Radiation Oncology

## 2016-09-10 DIAGNOSIS — Z510 Encounter for antineoplastic radiation therapy: Secondary | ICD-10-CM | POA: Diagnosis not present

## 2016-09-10 DIAGNOSIS — C50212 Malignant neoplasm of upper-inner quadrant of left female breast: Secondary | ICD-10-CM | POA: Diagnosis not present

## 2016-09-11 ENCOUNTER — Encounter: Payer: Self-pay | Admitting: Radiation Oncology

## 2016-09-11 ENCOUNTER — Ambulatory Visit
Admission: RE | Admit: 2016-09-11 | Discharge: 2016-09-11 | Disposition: A | Discharge status: Home / Self Care | Payer: PPO | Source: Ambulatory Visit | Attending: Radiation Oncology | Admitting: Radiation Oncology

## 2016-09-11 VITALS — BP 114/76 | HR 84 | Temp 98.2°F | Resp 20 | Wt 159.6 lb

## 2016-09-11 DIAGNOSIS — Z510 Encounter for antineoplastic radiation therapy: Secondary | ICD-10-CM | POA: Diagnosis not present

## 2016-09-11 DIAGNOSIS — Z170 Estrogen receptor positive status [ER+]: Principal | ICD-10-CM

## 2016-09-11 DIAGNOSIS — C50212 Malignant neoplasm of upper-inner quadrant of left female breast: Secondary | ICD-10-CM

## 2016-09-11 NOTE — Progress Notes (Addendum)
Weekly rad txs left breast 9/17 completed, no c/o pain, mild if any erythema , using radiaplex bid, appetite good, energy level  50% stated,   Takes rest periods in the afternoon, 10:05 AM BP 114/76 (BP Location: Right Arm, Patient Position: Sitting, Cuff Size: Normal)   Pulse 84   Temp 98.2 F (36.8 C) (Oral)   Resp 20   Wt 159 lb 9.6 oz (72.4 kg)   BMI 27.40 kg/m

## 2016-09-11 NOTE — Progress Notes (Signed)
Department of Radiation Oncology  Phone:  [phone] Fax:        [phone]  Weekly Treatment Note    Name: Kayla Baker Date: 09/11/2016 MRN: [id number] DOB: 07-30-1936   Diagnosis:     ICD-9-CM ICD-10-CM   1. Malignant neoplasm of upper-inner quadrant of left breast in female, estrogen receptor positive ([place]) 174.2 C50.212    V86.0 Z17.0      Current dose: 22.5 Gy  Current fraction: 9   MEDICATIONS: Current Outpatient Prescriptions  Medication Sig Dispense Refill  . allopurinol (ZYLOPRIM) 300 MG tablet TAKE 1 TABLET BY MOUTH EVERY DAY 90 tablet 1  . amitriptyline (ELAVIL) 25 MG tablet TAKE 1 TO 2 TABLETS BY MOUTH AT BEDTIME FOR NEURITIS 180 tablet 1  . aspirin 81 MG tablet Take 81 mg by mouth daily.    Marland Kitchen atorvastatin (LIPITOR) 40 MG tablet TAKE 1 TABLET BY MOUTH EVERY DAY 90 tablet 1  . furosemide (LASIX) 20 MG tablet Take 2 tablets (40 mg total) by mouth daily. 30 tablet 2  . gabapentin (NEURONTIN) 100 MG capsule Take 1 to 3 capsules at bedtime for neuritis 90 capsule 5  . glucose blood (FREESTYLE LITE) test strip USE ONE STRIP TO CHECK GLUCOSE ONCE DAILY. DX-E11.22 100 each 3  . hyaluronate sodium (RADIAPLEXRX) GEL Apply 1 application topically 2 (two) times daily.    . Lancets (FREESTYLE) lancets Check blood sugar one time daily-E11.22 100 each 3  . LORazepam (ATIVAN) 0.5 MG tablet Take 0.5 mg by mouth 3 times/day as needed-between meals & bedtime for anxiety or sleep.    . metoprolol succinate (TOPROL XL) 25 MG 24 hr tablet Take 1 tablet (25 mg total) by mouth daily. 30 tablet 3  . non-metallic deodorant (ALRA) MISC Apply 1 application topically daily as needed.    . ranitidine (ZANTAC) 300 MG tablet Take twice a day as needed for Heartburn & Acid Indigestion 60 tablet 3  . vitamin C (ASCORBIC ACID) 500 MG tablet Take 500 mg by mouth daily. Takes 2 tablets daily     No current facility-administered medications for this encounter.      ALLERGIES: Keflex  [cephalexin]; Meloxicam; Prednisone; Premarin [estrogens conjugated]; and Trovan [alatrofloxacin]   LABORATORY DATA:  Lab Results  Component Value Date   WBC 3.4 (L) 08/07/2016   HGB 8.5 (L) 08/07/2016   HCT 26.1 (L) 08/07/2016   MCV 101.6 (H) 08/07/2016   PLT 184 08/07/2016   Lab Results  Component Value Date   NA 142 08/07/2016   K 4.3 08/07/2016   CL 103 04/02/2016   CO2 24 08/07/2016   Lab Results  Component Value Date   ALT 28 08/07/2016   AST 25 08/07/2016   ALKPHOS 106 08/07/2016   BILITOT 0.45 08/07/2016     NARRATIVE: Kayla Baker was seen today for weekly treatment management. The chart was checked and the patient's films were reviewed.  Weekly rad txs left breast 9/17 completed, no c/o pain, mild if any erythema , using radiaplex bid, appetite good, energy level  50% stated,   Takes rest periods in the afternoon, 10:17 AM BP 114/76 (BP Location: Right Arm, Patient Position: Sitting, Cuff Size: Normal)   Pulse 84   Temp 98.2 F (36.8 C) (Oral)   Resp 20   Wt 159 lb 9.6 oz (72.4 kg)   BMI 27.40 kg/m   PHYSICAL EXAMINATION: weight is 159 lb 9.6 oz (72.4 kg). Her oral temperature is 98.2 F (36.8 C). Her  blood pressure is 114/76 and her pulse is 84. Her respiration is 20.      Mild hyperpigmentation in the treatment area  ASSESSMENT: The patient is doing satisfactorily with treatment.  PLAN: We will continue with the patient's radiation treatment as planned.

## 2016-09-12 ENCOUNTER — Ambulatory Visit
Admission: RE | Admit: 2016-09-12 | Discharge: 2016-09-12 | Disposition: A | Discharge status: Home / Self Care | Payer: PPO | Source: Ambulatory Visit | Attending: Radiation Oncology | Admitting: Radiation Oncology

## 2016-09-12 DIAGNOSIS — C50212 Malignant neoplasm of upper-inner quadrant of left female breast: Secondary | ICD-10-CM | POA: Diagnosis not present

## 2016-09-12 DIAGNOSIS — Z510 Encounter for antineoplastic radiation therapy: Secondary | ICD-10-CM | POA: Diagnosis not present

## 2016-09-15 ENCOUNTER — Ambulatory Visit
Admission: RE | Admit: 2016-09-15 | Discharge: 2016-09-15 | Disposition: A | Discharge status: Home / Self Care | Payer: PPO | Source: Ambulatory Visit | Attending: Radiation Oncology | Admitting: Radiation Oncology

## 2016-09-15 DIAGNOSIS — C50212 Malignant neoplasm of upper-inner quadrant of left female breast: Secondary | ICD-10-CM | POA: Diagnosis not present

## 2016-09-15 DIAGNOSIS — Z510 Encounter for antineoplastic radiation therapy: Secondary | ICD-10-CM | POA: Diagnosis not present

## 2016-09-16 ENCOUNTER — Ambulatory Visit
Admission: RE | Admit: 2016-09-16 | Discharge: 2016-09-16 | Disposition: A | Discharge status: Home / Self Care | Payer: PPO | Source: Ambulatory Visit | Attending: Radiation Oncology | Admitting: Radiation Oncology

## 2016-09-16 DIAGNOSIS — Z510 Encounter for antineoplastic radiation therapy: Secondary | ICD-10-CM | POA: Diagnosis not present

## 2016-09-16 DIAGNOSIS — C50212 Malignant neoplasm of upper-inner quadrant of left female breast: Secondary | ICD-10-CM | POA: Diagnosis not present

## 2016-09-17 ENCOUNTER — Ambulatory Visit
Admission: RE | Admit: 2016-09-17 | Discharge: 2016-09-17 | Disposition: A | Discharge status: Home / Self Care | Payer: PPO | Source: Ambulatory Visit | Attending: Radiation Oncology | Admitting: Radiation Oncology

## 2016-09-17 ENCOUNTER — Encounter: Payer: Self-pay | Admitting: Radiation Oncology

## 2016-09-17 VITALS — BP 142/92 | HR 88 | Temp 98.0°F | Resp 16 | Wt 159.8 lb

## 2016-09-17 DIAGNOSIS — C50212 Malignant neoplasm of upper-inner quadrant of left female breast: Secondary | ICD-10-CM | POA: Diagnosis not present

## 2016-09-17 DIAGNOSIS — Z170 Estrogen receptor positive status [ER+]: Principal | ICD-10-CM

## 2016-09-17 DIAGNOSIS — Z510 Encounter for antineoplastic radiation therapy: Secondary | ICD-10-CM | POA: Diagnosis not present

## 2016-09-17 NOTE — Progress Notes (Signed)
Department of Radiation Oncology  Phone:  [phone] Fax:        [phone]  Weekly Treatment Note    Name: Kayla Baker Date: 09/17/2016 MRN: [id number] DOB: [birth year]   Diagnosis:     ICD-9-CM ICD-10-CM   1. Malignant neoplasm of upper-inner quadrant of left breast in female, estrogen receptor positive ([place]) 174.2 C50.212    V86.0 Z17.0      Current dose: 32.5 Gy  Current fraction: 13   MEDICATIONS: Current Outpatient Prescriptions  Medication Sig Dispense Refill  . allopurinol (ZYLOPRIM) 300 MG tablet TAKE 1 TABLET BY MOUTH EVERY DAY 90 tablet 1  . amitriptyline (ELAVIL) 25 MG tablet TAKE 1 TO 2 TABLETS BY MOUTH AT BEDTIME FOR NEURITIS 180 tablet 1  . aspirin 81 MG tablet Take 81 mg by mouth daily.    Marland Kitchen atorvastatin (LIPITOR) 40 MG tablet TAKE 1 TABLET BY MOUTH EVERY DAY 90 tablet 1  . furosemide (LASIX) 20 MG tablet Take 2 tablets (40 mg total) by mouth daily. 30 tablet 2  . gabapentin (NEURONTIN) 100 MG capsule Take 1 to 3 capsules at bedtime for neuritis 90 capsule 5  . glucose blood (FREESTYLE LITE) test strip USE ONE STRIP TO CHECK GLUCOSE ONCE DAILY. DX-E11.22 100 each 3  . hyaluronate sodium (RADIAPLEXRX) GEL Apply 1 application topically 2 (two) times daily.    . Lancets (FREESTYLE) lancets Check blood sugar one time daily-E11.22 100 each 3  . LORazepam (ATIVAN) 0.5 MG tablet Take 0.5 mg by mouth 3 times/day as needed-between meals & bedtime for anxiety or sleep.    . metoprolol succinate (TOPROL XL) 25 MG 24 hr tablet Take 1 tablet (25 mg total) by mouth daily. 30 tablet 3  . non-metallic deodorant (ALRA) MISC Apply 1 application topically daily as needed.    . ranitidine (ZANTAC) 300 MG tablet Take twice a day as needed for Heartburn & Acid Indigestion 60 tablet 3  . vitamin C (ASCORBIC ACID) 500 MG tablet Take 500 mg by mouth daily. Takes 2 tablets daily     No current facility-administered medications for this encounter.      ALLERGIES: Keflex  [cephalexin]; Meloxicam; Prednisone; Premarin [estrogens conjugated]; and Trovan [alatrofloxacin]   LABORATORY DATA:  Lab Results  Component Value Date   WBC 3.4 (L) 08/07/2016   HGB 8.5 (L) 08/07/2016   HCT 26.1 (L) 08/07/2016   MCV 101.6 (H) 08/07/2016   PLT 184 08/07/2016   Lab Results  Component Value Date   NA 142 08/07/2016   K 4.3 08/07/2016   CL 103 04/02/2016   CO2 24 08/07/2016   Lab Results  Component Value Date   ALT 28 08/07/2016   AST 25 08/07/2016   ALKPHOS 106 08/07/2016   BILITOT 0.45 08/07/2016     NARRATIVE: Kayla Baker was seen today for weekly treatment management. The chart was checked and the patient's films were reviewed.  Weekly rad txs left breast, 13/19 completed. Mild erythema, skin intact. Using radiaplex bid, good appetite, denies good no pain.  11:16 AM BP (!) 142/92 (BP Location: Left Arm, Patient Position: Sitting, Cuff Size: Normal)   Pulse 88   Temp 98 F (36.7 C) (Oral)   Resp 16   Wt 159 lb 12.8 oz (72.5 kg)   BMI 27.43 kg/m   PHYSICAL EXAMINATION: weight is 159 lb 12.8 oz (72.5 kg). Her oral temperature is 98 F (36.7 C). Her blood pressure is 142/92 (abnormal) and her pulse is 88. Her  respiration is 16.   Mild hyperpigmentation in the treatment area. No desquamation.  ASSESSMENT: The patient is doing satisfactorily with treatment.  PLAN: We will continue with the patient's radiation treatment as planned.   ------------------------------------------------  Jodelle Gross, MD, PhD  This document serves as a record of services personally performed by Kyung Rudd, MD. It was created on his behalf by Darcus Austin, a trained medical scribe. The creation of this record is based on the scribe's personal observations and the provider's statements to them. This document has been checked and approved by the attending provider.

## 2016-09-17 NOTE — Progress Notes (Signed)
Weekly rad txs left breast, 13/19 completed, mild erythema skin intact,    Using radiaplex bid, appetite good no pain 10:09 AM BP (!) 142/92 (BP Location: Left Arm, Patient Position: Sitting, Cuff Size: Normal)   Pulse 88   Temp 98 F (36.7 C) (Oral)   Resp 16   Wt 159 lb 12.8 oz (72.5 kg)   BMI 27.43 kg/m   Wt Readings from Last 3 Encounters:  09/17/16 159 lb 12.8 oz (72.5 kg)  09/11/16 159 lb 9.6 oz (72.4 kg)  09/04/16 161 lb 6.4 oz (73.2 kg)

## 2016-09-18 ENCOUNTER — Ambulatory Visit
Admission: RE | Admit: 2016-09-18 | Discharge: 2016-09-18 | Disposition: A | Discharge status: Home / Self Care | Payer: PPO | Source: Ambulatory Visit | Attending: Radiation Oncology | Admitting: Radiation Oncology

## 2016-09-18 ENCOUNTER — Ambulatory Visit (HOSPITAL_BASED_OUTPATIENT_CLINIC_OR_DEPARTMENT_OTHER): Payer: PPO

## 2016-09-18 DIAGNOSIS — Z510 Encounter for antineoplastic radiation therapy: Secondary | ICD-10-CM | POA: Diagnosis not present

## 2016-09-18 DIAGNOSIS — C50212 Malignant neoplasm of upper-inner quadrant of left female breast: Secondary | ICD-10-CM | POA: Diagnosis not present

## 2016-09-18 DIAGNOSIS — Z95828 Presence of other vascular implants and grafts: Secondary | ICD-10-CM

## 2016-09-18 DIAGNOSIS — Z452 Encounter for adjustment and management of vascular access device: Secondary | ICD-10-CM | POA: Diagnosis not present

## 2016-09-18 DIAGNOSIS — Z170 Estrogen receptor positive status [ER+]: Secondary | ICD-10-CM

## 2016-09-18 MED ORDER — SODIUM CHLORIDE 0.9% FLUSH
10.0000 mL | INTRAVENOUS | Status: DC | PRN
  Administered 2016-09-18: 10 mL via INTRAVENOUS
  Filled 2016-09-18: qty 10

## 2016-09-18 MED ORDER — HEPARIN SOD (PORK) LOCK FLUSH 100 UNIT/ML IV SOLN
500.0000 [IU] | Freq: Once | INTRAVENOUS | Status: AC | PRN
  Administered 2016-09-18: 500 [IU] via INTRAVENOUS
  Filled 2016-09-18: qty 5

## 2016-09-19 ENCOUNTER — Ambulatory Visit
Admission: RE | Admit: 2016-09-19 | Discharge: 2016-09-19 | Disposition: A | Discharge status: Home / Self Care | Payer: PPO | Source: Ambulatory Visit | Attending: Radiation Oncology | Admitting: Radiation Oncology

## 2016-09-19 ENCOUNTER — Encounter: Payer: Self-pay | Admitting: Radiation Oncology

## 2016-09-19 DIAGNOSIS — Z510 Encounter for antineoplastic radiation therapy: Secondary | ICD-10-CM | POA: Diagnosis not present

## 2016-09-19 DIAGNOSIS — C50212 Malignant neoplasm of upper-inner quadrant of left female breast: Secondary | ICD-10-CM | POA: Diagnosis not present

## 2016-09-22 ENCOUNTER — Ambulatory Visit
Admission: RE | Admit: 2016-09-22 | Discharge: 2016-09-22 | Disposition: A | Discharge status: Home / Self Care | Payer: PPO | Source: Ambulatory Visit | Attending: Radiation Oncology | Admitting: Radiation Oncology

## 2016-09-22 DIAGNOSIS — C50212 Malignant neoplasm of upper-inner quadrant of left female breast: Secondary | ICD-10-CM | POA: Diagnosis not present

## 2016-09-22 DIAGNOSIS — Z510 Encounter for antineoplastic radiation therapy: Secondary | ICD-10-CM | POA: Diagnosis not present

## 2016-09-23 ENCOUNTER — Ambulatory Visit
Admission: RE | Admit: 2016-09-23 | Discharge: 2016-09-23 | Disposition: A | Discharge status: Home / Self Care | Payer: PPO | Source: Ambulatory Visit | Attending: Radiation Oncology | Admitting: Radiation Oncology

## 2016-09-23 DIAGNOSIS — Z510 Encounter for antineoplastic radiation therapy: Secondary | ICD-10-CM | POA: Diagnosis not present

## 2016-09-23 DIAGNOSIS — C50212 Malignant neoplasm of upper-inner quadrant of left female breast: Secondary | ICD-10-CM | POA: Diagnosis not present

## 2016-09-24 ENCOUNTER — Ambulatory Visit
Admission: RE | Admit: 2016-09-24 | Discharge: 2016-09-24 | Disposition: A | Discharge status: Home / Self Care | Payer: PPO | Source: Ambulatory Visit | Attending: Radiation Oncology | Admitting: Radiation Oncology

## 2016-09-24 DIAGNOSIS — C50212 Malignant neoplasm of upper-inner quadrant of left female breast: Secondary | ICD-10-CM | POA: Diagnosis not present

## 2016-09-24 DIAGNOSIS — Z510 Encounter for antineoplastic radiation therapy: Secondary | ICD-10-CM | POA: Diagnosis not present

## 2016-09-25 ENCOUNTER — Ambulatory Visit
Admission: RE | Admit: 2016-09-25 | Discharge: 2016-09-25 | Disposition: A | Discharge status: Home / Self Care | Payer: PPO | Source: Ambulatory Visit | Attending: Radiation Oncology | Admitting: Radiation Oncology

## 2016-09-25 DIAGNOSIS — Z510 Encounter for antineoplastic radiation therapy: Secondary | ICD-10-CM | POA: Diagnosis not present

## 2016-09-25 DIAGNOSIS — C50212 Malignant neoplasm of upper-inner quadrant of left female breast: Secondary | ICD-10-CM | POA: Diagnosis not present

## 2016-09-26 ENCOUNTER — Telehealth: Payer: Self-pay | Admitting: *Deleted

## 2016-09-26 ENCOUNTER — Encounter: Payer: Self-pay | Admitting: Radiation Oncology

## 2016-09-26 ENCOUNTER — Ambulatory Visit
Admission: RE | Admit: 2016-09-26 | Discharge: 2016-09-26 | Disposition: A | Discharge status: Home / Self Care | Payer: PPO | Source: Ambulatory Visit | Attending: Radiation Oncology | Admitting: Radiation Oncology

## 2016-09-26 ENCOUNTER — Inpatient Hospital Stay
Admission: RE | Admit: 2016-09-26 | Discharge: 2016-09-26 | Disposition: A | Discharge status: Home / Self Care | Payer: Self-pay | Source: Ambulatory Visit | Attending: Radiation Oncology | Admitting: Radiation Oncology

## 2016-09-26 VITALS — BP 132/78 | HR 86 | Temp 98.0°F | Resp 20 | Wt 158.8 lb

## 2016-09-26 DIAGNOSIS — C50212 Malignant neoplasm of upper-inner quadrant of left female breast: Secondary | ICD-10-CM | POA: Diagnosis not present

## 2016-09-26 DIAGNOSIS — Z170 Estrogen receptor positive status [ER+]: Principal | ICD-10-CM

## 2016-09-26 DIAGNOSIS — Z510 Encounter for antineoplastic radiation therapy: Secondary | ICD-10-CM | POA: Diagnosis not present

## 2016-09-26 NOTE — Progress Notes (Signed)
Department of Radiation Oncology  Phone:  [phone] Fax:        [phone]  Weekly Treatment Note    Name: Kayla Baker Date: 09/26/2016 MRN: [id number] DOB: November 14, 1936   Current dose: 50 Gy  Current fraction:20   MEDICATIONS: Current Outpatient Prescriptions  Medication Sig Dispense Refill  . allopurinol (ZYLOPRIM) 300 MG tablet TAKE 1 TABLET BY MOUTH EVERY DAY 90 tablet 1  . amitriptyline (ELAVIL) 25 MG tablet TAKE 1 TO 2 TABLETS BY MOUTH AT BEDTIME FOR NEURITIS 180 tablet 1  . aspirin 81 MG tablet Take 81 mg by mouth daily.    Marland Kitchen atorvastatin (LIPITOR) 40 MG tablet TAKE 1 TABLET BY MOUTH EVERY DAY 90 tablet 1  . furosemide (LASIX) 20 MG tablet Take 2 tablets (40 mg total) by mouth daily. 30 tablet 2  . gabapentin (NEURONTIN) 100 MG capsule Take 1 to 3 capsules at bedtime for neuritis 90 capsule 5  . glucose blood (FREESTYLE LITE) test strip USE ONE STRIP TO CHECK GLUCOSE ONCE DAILY. DX-E11.22 100 each 3  . hyaluronate sodium (RADIAPLEXRX) GEL Apply 1 application topically 2 (two) times daily.    . Lancets (FREESTYLE) lancets Check blood sugar one time daily-E11.22 100 each 3  . LORazepam (ATIVAN) 0.5 MG tablet Take 0.5 mg by mouth 3 times/day as needed-between meals & bedtime for anxiety or sleep.    . metoprolol succinate (TOPROL XL) 25 MG 24 hr tablet Take 1 tablet (25 mg total) by mouth daily. 30 tablet 3  . non-metallic deodorant (ALRA) MISC Apply 1 application topically daily as needed.    . ranitidine (ZANTAC) 300 MG tablet Take twice a day as needed for Heartburn & Acid Indigestion 60 tablet 3  . vitamin C (ASCORBIC ACID) 500 MG tablet Take 500 mg by mouth daily. Takes 2 tablets daily     No current facility-administered medications for this encounter.      ALLERGIES: Keflex [cephalexin]; Meloxicam; Prednisone; Premarin [estrogens conjugated]; and Trovan [alatrofloxacin]   LABORATORY DATA:  Lab Results  Component Value Date   WBC 3.4 (L) 08/07/2016   HGB  8.5 (L) 08/07/2016   HCT 26.1 (L) 08/07/2016   MCV 101.6 (H) 08/07/2016   PLT 184 08/07/2016   Lab Results  Component Value Date   NA 142 08/07/2016   K 4.3 08/07/2016   CL 103 04/02/2016   CO2 24 08/07/2016   Lab Results  Component Value Date   ALT 28 08/07/2016   AST 25 08/07/2016   ALKPHOS 106 08/07/2016   BILITOT 0.45 08/07/2016     NARRATIVE: Kayla Baker was seen today for weekly treatment management. The chart was checked and the patient's films were reviewed.  Weekly rad txs left breast 20/20 completed. Mild erythema, skin intact, using radiaplex bid. No pain, appetite good, slight fatigue.  PHYSICAL EXAMINATION: weight is 158 lb 12.8 oz (72 kg). Her oral temperature is 98 F (36.7 C). Her blood pressure is 132/78 and her pulse is 86. Her respiration is 20.   ASSESSMENT: The patient did satisfactorily with treatment.  PLAN: The patient will follow-up in our clinic in 1 month.  ------------------------------------------------  Jodelle Gross, MD, PhD  This document serves as a record of services personally performed by Kyung Rudd, MD. It was created on his behalf by Darcus Austin, a trained medical scribe. The creation of this record is based on the scribe's personal observations and the provider's statements to them. This document has been checked and approved by  the attending provider.

## 2016-09-26 NOTE — Progress Notes (Signed)
Weekly rad txs  Left breast 20/20 completed, mild erythema only, skin intact, using radiaplex bid,    No pioan, appetite good, slight fatigue, f/u appt with Shona Simpson, PA 11/06/16 BP 132/78 (BP Location: Right Arm, Patient Position: Sitting, Cuff Size: Normal)   Pulse 86   Temp 98 F (36.7 C) (Oral)   Resp 20   Wt 158 lb 12.8 oz (72 kg)   BMI 27.26 kg/m   Wt Readings from Last 3 Encounters:  09/26/16 158 lb 12.8 oz (72 kg)  09/17/16 159 lb 12.8 oz (72.5 kg)  09/11/16 159 lb 9.6 oz (72.4 kg)

## 2016-09-26 NOTE — Telephone Encounter (Signed)
  Oncology Nurse Navigator Documentation      )Navigator Encounter Type: Treatment (09/26/16 1400)               Multidisiplinary Clinic Date: 03/05/16 (09/26/16 1400) Multidisiplinary Clinic Type: Breast (09/26/16 1400)   Patient Visit Type: ARWPTY (09/26/16 1400) Treatment Phase: Final Radiation Tx (09/26/16 1400) Barriers/Navigation Needs: No barriers at this time;No Questions;No Needs (09/26/16 1400)   Interventions: None required (09/26/16 1400)            Acuity: Level 1 (09/26/16 1400)         Time Spent with Patient: 15 (09/26/16 1400)

## 2016-10-03 NOTE — Progress Notes (Signed)
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   2) Left breast boost: 7.5 Gy in 3 fractions.  Beams/energy:   1) 3D // 6X Photon   2) Isodose Plan // 10X, 6X Photon  Narrative: The patient tolerated radiation treatment relatively well. The patient developed mild hyperpigmentation changes and erythema in the treatment area without desquamation.  Plan: The patient has completed radiation treatment. The patient will return to radiation oncology clinic for routine followup in one month. I advised them to call or return sooner if they have any questions or concerns related to their recovery or treatment.  ------------------------------------------------  Jodelle Gross, MD, PhD  This document serves as a record of services personally performed by Kyung Rudd, MD. It was created on his behalf by Darcus Austin, a trained medical scribe. The creation of this record is based on the scribe's personal observations and the provider's statements to them. This document has been checked and approved by the attending provider.

## 2016-10-03 NOTE — Progress Notes (Signed)
Complex simulation note  Diagnosis: Left-sided breast cancer  Narrative The patient has initially been planned to receive a course of whole breast radiation to a dose of 42.5 Gy in 17 fractions. The patient will now receive an additional boost to the seroma cavity which has been contoured. This will correspond to a boost of 7.5 Gy at 2.5 Gy per fraction. To accomplish this, an additional 3 customized blocks have been designed for this purpose. A complex isodose plan is requested to ensure that the target area is adequately covered with radiation dose and that the nearby normal structures such as the lung are adequately spared. The patient's final total dose will be 50 Gy.  ------------------------------------------------  Jodelle Gross, MD, PhD

## 2016-10-09 ENCOUNTER — Encounter: Payer: Self-pay | Admitting: Physician Assistant

## 2016-10-09 ENCOUNTER — Ambulatory Visit (INDEPENDENT_AMBULATORY_CARE_PROVIDER_SITE_OTHER): Payer: PPO | Admitting: Physician Assistant

## 2016-10-09 VITALS — BP 128/66 | HR 110 | Temp 97.5°F | Resp 14 | Ht 64.0 in | Wt 159.0 lb

## 2016-10-09 DIAGNOSIS — Z170 Estrogen receptor positive status [ER+]: Secondary | ICD-10-CM | POA: Diagnosis not present

## 2016-10-09 DIAGNOSIS — E1140 Type 2 diabetes mellitus with diabetic neuropathy, unspecified: Secondary | ICD-10-CM

## 2016-10-09 DIAGNOSIS — C50212 Malignant neoplasm of upper-inner quadrant of left female breast: Secondary | ICD-10-CM

## 2016-10-09 DIAGNOSIS — E782 Mixed hyperlipidemia: Secondary | ICD-10-CM

## 2016-10-09 DIAGNOSIS — Z79899 Other long term (current) drug therapy: Secondary | ICD-10-CM | POA: Diagnosis not present

## 2016-10-09 DIAGNOSIS — E559 Vitamin D deficiency, unspecified: Secondary | ICD-10-CM

## 2016-10-09 DIAGNOSIS — I10 Essential (primary) hypertension: Secondary | ICD-10-CM | POA: Diagnosis not present

## 2016-10-09 DIAGNOSIS — E1122 Type 2 diabetes mellitus with diabetic chronic kidney disease: Secondary | ICD-10-CM | POA: Diagnosis not present

## 2016-10-09 DIAGNOSIS — K219 Gastro-esophageal reflux disease without esophagitis: Secondary | ICD-10-CM

## 2016-10-09 DIAGNOSIS — N183 Chronic kidney disease, stage 3 unspecified: Secondary | ICD-10-CM

## 2016-10-09 LAB — CBC WITH DIFFERENTIAL/PLATELET
Basophils Absolute: 39 cells/uL (ref 0–200)
Basophils Relative: 1 %
Eosinophils Absolute: 156 cells/uL (ref 15–500)
Eosinophils Relative: 4 %
HEMATOCRIT: 37.6 % (ref 35.0–45.0)
HEMOGLOBIN: 12.3 g/dL (ref 11.7–15.5)
LYMPHS ABS: 975 {cells}/uL (ref 850–3900)
Lymphocytes Relative: 25 %
MCH: 31.1 pg (ref 27.0–33.0)
MCHC: 32.7 g/dL (ref 32.0–36.0)
MCV: 94.9 fL (ref 80.0–100.0)
MONO ABS: 390 {cells}/uL (ref 200–950)
MPV: 10.1 fL (ref 7.5–12.5)
Monocytes Relative: 10 %
NEUTROS ABS: 2340 {cells}/uL (ref 1500–7800)
NEUTROS PCT: 60 %
Platelets: 75 10*3/uL — ABNORMAL LOW (ref 140–400)
RBC: 3.96 MIL/uL (ref 3.80–5.10)
RDW: 14.1 % (ref 11.0–15.0)
WBC: 3.9 10*3/uL (ref 3.8–10.8)

## 2016-10-09 LAB — BASIC METABOLIC PANEL WITH GFR
BUN: 22 mg/dL (ref 7–25)
CALCIUM: 9.5 mg/dL (ref 8.6–10.4)
CHLORIDE: 105 mmol/L (ref 98–110)
CO2: 24 mmol/L (ref 20–31)
Creat: 1.35 mg/dL — ABNORMAL HIGH (ref 0.60–0.93)
GFR, EST NON AFRICAN AMERICAN: 38 mL/min — AB (ref >=60)
GFR, Est African American: 43 mL/min — ABNORMAL LOW (ref >=60)
GLUCOSE: 184 mg/dL — AB (ref 65–99)
POTASSIUM: 3.7 mmol/L (ref 3.5–5.3)
SODIUM: 142 mmol/L (ref 135–146)

## 2016-10-09 LAB — HEPATIC FUNCTION PANEL
ALK PHOS: 136 U/L — AB (ref 33–130)
ALT: 74 U/L — AB (ref 6–29)
AST: 73 U/L — AB (ref 10–35)
Albumin: 4 g/dL (ref 3.6–5.1)
BILIRUBIN DIRECT: 0.1 mg/dL (ref <=0.2)
BILIRUBIN INDIRECT: 0.4 mg/dL (ref 0.2–1.2)
BILIRUBIN TOTAL: 0.5 mg/dL (ref 0.2–1.2)
Total Protein: 6.4 g/dL (ref 6.1–8.1)

## 2016-10-09 LAB — LIPID PANEL
CHOL/HDL RATIO: 3.8 ratio (ref <5.0)
Cholesterol: 144 mg/dL (ref <200)
HDL: 38 mg/dL — ABNORMAL LOW (ref >50)
LDL CALC: 66 mg/dL
Triglycerides: 200 mg/dL — ABNORMAL HIGH (ref <150)
VLDL: 40 mg/dL — AB (ref <30)

## 2016-10-09 LAB — MAGNESIUM: MAGNESIUM: 1.3 mg/dL — AB (ref 1.5–2.5)

## 2016-10-09 LAB — AMYLASE: Amylase: 28 U/L (ref 0–105)

## 2016-10-09 LAB — HEMOGLOBIN A1C
Hgb A1c MFr Bld: 6.9 % — ABNORMAL HIGH (ref <5.7)
MEAN PLASMA GLUCOSE: 151 mg/dL

## 2016-10-09 LAB — TSH: TSH: 2.48 mIU/L

## 2016-10-09 NOTE — Progress Notes (Signed)
Assessment and Plan:  Hypertension -Continue medication, monitor blood pressure at home. Continue DASH diet.  Reminder to go to the ER if any CP, SOB, nausea, dizziness, severe HA, changes vision/speech, left arm numbness and tingling and jaw pain.  Cholesterol -Continue diet and exercise. Check cholesterol.  Vitamin D Def - check level and continue medications.   GERD -get back on zantac, check amalyse  Type 2 diabetes mellitus with stage 3 chronic kidney disease, without long-term current use of insulin ([place]) Discussed general issues about diabetes pathophysiology and management., Educational material distributed., Suggested low cholesterol diet., Encouraged aerobic exercise., Discussed foot care., Reminded to get yearly retinal exam. -     BASIC METABOLIC PANEL WITH GFR -     Hemoglobin A1c  Diabetic neuropathy, painful (HCC) - continue lyrica/amtriptyline, check feet daily -     Hemoglobin A1c  Malignant neoplasm of upper-inner quadrant of left breast in female, estrogen receptor positive ([place]) Has stopped chemo/radiation 10/27 Continue follow up Check labs Can take lasix PRN, no signs of fluid over load, do compression stockings, elevate feet   Continue diet and meds as discussed. Further disposition pending results of labs. Discussed med's effects and SE's.    Over 30 minutes of exam, counseling, chart review, and critical decision making was performed   HPI 80 y.o. female  presents for 3 month follow up on hypertension, cholesterol, diabetes and vitamin D deficiency.   Her blood pressure has been controlled at home, today her BP is BP: 128/66. She finished chemo/radiation 27th of Oct for left breast cancer, following with Dr. Humphrey Rolls. She was put on lasix for her legs and feet swelling, she has not taken it for a month. She had normal echo other than grade 1 diastolic dysfunction. No PND, orthopnea, SOB, cough. Weight is stable/down.  Wt Readings from Last 3 Encounters:   10/09/16 159 lb (72.1 kg)  09/26/16 158 lb 12.8 oz (72 kg)  09/17/16 159 lb 12.8 oz (72.5 kg)    She does workout. She denies chest pain, shortness of breath, dizziness.  She is on cholesterol medication and denies myalgias. Her cholesterol is at goal. The cholesterol was:   Lab Results  Component Value Date   CHOL 135 07/08/2016   HDL 47 07/08/2016   LDLCALC 56 07/08/2016   TRIG 161 (H) 07/08/2016   CHOLHDL 2.9 07/08/2016    She has been working on diet and exercise for diabetes with diabetic chronic kidney disease and with diabetic polyneuropathy, she is on bASA, she is on ACE/ARB, she is on amitriptyline 2 in the AM and is off lyrica at this time, she is doing well with her neuropathy, will only take lyrica if she needs it, and denies  polydipsia, polyuria and visual disturbances. Last A1C was:  Lab Results  Component Value Date   HGBA1C 6.9 (H) 07/08/2016  Last GFR, CKD due to DM2. She is on HCTZ and prilosec daily.  Lab Results  Component Value Date   GFRNONAA 36 (L) 04/02/2016   Patient is on Vitamin D supplement. Lab Results  Component Value Date   VD25OH 41 07/08/2016     Current Medications:  Current Outpatient Prescriptions on File Prior to Visit  Medication Sig Dispense Refill  . allopurinol (ZYLOPRIM) 300 MG tablet TAKE 1 TABLET BY MOUTH EVERY DAY 90 tablet 1  . amitriptyline (ELAVIL) 25 MG tablet TAKE 1 TO 2 TABLETS BY MOUTH AT BEDTIME FOR NEURITIS 180 tablet 1  . aspirin 81 MG tablet Take  81 mg by mouth daily.    Marland Kitchen atorvastatin (LIPITOR) 40 MG tablet TAKE 1 TABLET BY MOUTH EVERY DAY 90 tablet 1  . furosemide (LASIX) 20 MG tablet Take 2 tablets (40 mg total) by mouth daily. 30 tablet 2  . gabapentin (NEURONTIN) 100 MG capsule Take 1 to 3 capsules at bedtime for neuritis 90 capsule 5  . glucose blood (FREESTYLE LITE) test strip USE ONE STRIP TO CHECK GLUCOSE ONCE DAILY. DX-E11.22 100 each 3  . hyaluronate sodium (RADIAPLEXRX) GEL Apply 1 application topically 2  (two) times daily.    . Lancets (FREESTYLE) lancets Check blood sugar one time daily-E11.22 100 each 3  . metoprolol succinate (TOPROL XL) 25 MG 24 hr tablet Take 1 tablet (25 mg total) by mouth daily. 30 tablet 3  . ranitidine (ZANTAC) 300 MG tablet Take twice a day as needed for Heartburn & Acid Indigestion 60 tablet 3  . vitamin C (ASCORBIC ACID) 500 MG tablet Take 500 mg by mouth daily. Takes 2 tablets daily     No current facility-administered medications on file prior to visit.    Medical History:  Past Medical History:  Diagnosis Date  . Allergy   . Anemia   . Arthritis   . Breast cancer of upper-inner quadrant of left female breast ([place]) 02/29/2016   skin- 2016- squamous- on right upper arm  . Diet-controlled type 2 diabetes mellitus ([place])    diet and exercsise,Has never been on meds  . Diverticulosis   . GERD (gastroesophageal reflux disease)   . Gout    takes Allopurinol daily  . History of colon polyps    benign  . History of shingles   . Hyperlipidemia    takes Atorvastatin daily  . Hypertension    takes Enalapril and HCTZ daily  . Peripheral neuropathy (HCC)    takes Gabapentin and Elavil daily  . PONV (postoperative nausea and vomiting)   . Vitamin D deficiency    Allergies:  Allergies  Allergen Reactions  . Keflex [Cephalexin] Swelling    Tongue and throat swells  . Meloxicam Swelling  . Prednisone Swelling    Can Not take High doses  . Premarin [Estrogens Conjugated] Hives  . Trovan [Alatrofloxacin] Other (See Comments)    Unknown reaction     Review of Systems:  Review of Systems  Constitutional: Negative.   HENT: Negative.   Eyes: Negative.   Respiratory: Negative.   Cardiovascular: Negative.   Gastrointestinal: Negative.   Genitourinary: Negative.   Musculoskeletal: Negative.   Skin: Negative.   Neurological: Negative.   Endo/Heme/Allergies: Negative.   Psychiatric/Behavioral: Negative.     Family history- Review and unchanged Social  history- Review and unchanged Physical Exam: BP 128/66   Pulse (!) 110   Temp 97.5 F (36.4 C)   Resp 14   Ht 5' 4"  (1.626 m)   Wt 159 lb (72.1 kg)   SpO2 97%   BMI 27.29 kg/m  Wt Readings from Last 3 Encounters:  10/09/16 159 lb (72.1 kg)  09/26/16 158 lb 12.8 oz (72 kg)  09/17/16 159 lb 12.8 oz (72.5 kg)   General Appearance: Well nourished, in no apparent distress. Eyes: PERRLA, EOMs, conjunctiva no swelling or erythema Sinuses: No Frontal/maxillary tenderness ENT/Mouth: Ext aud canals clear, TMs without erythema, bulging. No erythema, swelling, or exudate on post pharynx.  Tonsils not swollen or erythematous. Hearing normal.  Neck: Supple, thyroid normal.  Respiratory: Respiratory effort normal, BS equal bilaterally without rales, rhonchi, wheezing or  stridor.  Cardio: RRR with no MRGs. Brisk peripheral pulses with 1+ edema.  Abdomen: Soft, + BS.  Non tender, no guarding, rebound, hernias, masses. Lymphatics: Non tender without lymphadenopathy.  Musculoskeletal: Full ROM, 5/5 strength, Normal gait Skin: Warm, dry without rashes, lesions, ecchymosis.  Neuro: Cranial nerves intact. No cerebellar symptoms.  Psych: Awake and oriented X 3, normal affect, Insight and Judgment appropriate.    Vicie Mutters, PA-C 9:44 AM [facility]

## 2016-10-09 NOTE — Patient Instructions (Addendum)
HOME CARE INSTRUCTIONS   Do not stand or sit in one position for long periods of time. Do not sit with your legs crossed. Rest with your legs raised during the day.  Your legs have to be higher than your heart so that gravity will force the valves to open, so please really elevate your legs.   Wear elastic stockings or support hose. Do not wear other tight, encircling garments around the legs, pelvis, or waist.  [employer]  has a wide variety of well priced compression stockings. [place], [place] [id number] [phone]  Walk as much as possible to increase blood flow.  Raise the foot of your bed at night with 2-inch blocks. SEEK MEDICAL CARE IF:   The skin around your ankle starts to break down.  You have pain, redness, tenderness, or hard swelling developing in your leg over a vein.  You are uncomfortable due to leg pain. Document Released: 08/27/2005 Document Revised: 02/09/2012 Document Reviewed: 01/13/2011 [facility] Patient Information 2014 E. [employer].  Make sure you wear compression stockings, elevated your feet, decrease sugars.  If you have swelling that is uncomfortable, can take the lasix once daily for 2-3 days until the swelling gets better.   Get back on the zantac 150 or 356m once nightly for 2 weeks.  If any worsening pain, nausea, vomiting, diarrhea, black stool please call the office immediately or go to the ER     Bad carbs also include fruit juice, alcohol, and sweet tea. These are empty calories that do not signal to your brain that you are full.   Please remember the good carbs are still carbs which convert into sugar. So please measure them out no more than 1/2-1 cup of rice, oatmeal, pasta, and beans  Veggies are however free foods! Pile them on.   Not all fruit is created equal. Please see the list below, the fruit at the bottom is higher in sugars than the fruit at the top. Please avoid all dried fruits.

## 2016-10-10 LAB — VITAMIN D 25 HYDROXY (VIT D DEFICIENCY, FRACTURES): Vit D, 25-Hydroxy: 60 ng/mL (ref 30–100)

## 2016-10-13 ENCOUNTER — Other Ambulatory Visit: Payer: Self-pay

## 2016-10-13 MED ORDER — FUROSEMIDE 20 MG PO TABS: 40.0000 mg | ORAL_TABLET | Freq: Every day | ORAL | 2 refills | Status: DC

## 2016-10-29 ENCOUNTER — Ambulatory Visit (INDEPENDENT_AMBULATORY_CARE_PROVIDER_SITE_OTHER): Payer: PPO | Admitting: *Deleted

## 2016-10-29 DIAGNOSIS — R899 Unspecified abnormal finding in specimens from other organs, systems and tissues: Secondary | ICD-10-CM

## 2016-10-29 LAB — HEPATIC FUNCTION PANEL
ALBUMIN: 3.9 g/dL (ref 3.6–5.1)
ALK PHOS: 145 U/L — AB (ref 33–130)
ALT: 56 U/L — AB (ref 6–29)
AST: 48 U/L — AB (ref 10–35)
BILIRUBIN INDIRECT: 0.4 mg/dL (ref 0.2–1.2)
Bilirubin, Direct: 0.1 mg/dL (ref <=0.2)
TOTAL PROTEIN: 6.3 g/dL (ref 6.1–8.1)
Total Bilirubin: 0.5 mg/dL (ref 0.2–1.2)

## 2016-10-29 LAB — CBC WITH DIFFERENTIAL/PLATELET
BASOS ABS: 0 {cells}/uL (ref 0–200)
BASOS PCT: 0 %
EOS ABS: 105 {cells}/uL (ref 15–500)
Eosinophils Relative: 3 %
HEMATOCRIT: 38.2 % (ref 35.0–45.0)
HEMOGLOBIN: 12.4 g/dL (ref 11.7–15.5)
Lymphocytes Relative: 36 %
Lymphs Abs: 1260 cells/uL (ref 850–3900)
MCH: 30.5 pg (ref 27.0–33.0)
MCHC: 32.5 g/dL (ref 32.0–36.0)
MCV: 93.9 fL (ref 80.0–100.0)
MONO ABS: 315 {cells}/uL (ref 200–950)
MPV: 9.8 fL (ref 7.5–12.5)
Monocytes Relative: 9 %
NEUTROS ABS: 1820 {cells}/uL (ref 1500–7800)
Neutrophils Relative %: 52 %
Platelets: 73 10*3/uL — ABNORMAL LOW (ref 140–400)
RBC: 4.07 MIL/uL (ref 3.80–5.10)
RDW: 14.3 % (ref 11.0–15.0)
WBC: 3.5 10*3/uL — ABNORMAL LOW (ref 3.8–10.8)

## 2016-10-29 NOTE — Progress Notes (Signed)
Patient presents for 1 month f/u recheck CBC and HFP per Vicie Mutters, PA-C orders.

## 2016-10-30 ENCOUNTER — Other Ambulatory Visit: Payer: Self-pay | Admitting: Physician Assistant

## 2016-10-30 DIAGNOSIS — R7989 Other specified abnormal findings of blood chemistry: Secondary | ICD-10-CM

## 2016-10-30 DIAGNOSIS — R945 Abnormal results of liver function studies: Principal | ICD-10-CM

## 2016-10-30 DIAGNOSIS — R748 Abnormal levels of other serum enzymes: Secondary | ICD-10-CM

## 2016-11-04 ENCOUNTER — Encounter: Payer: Self-pay | Admitting: Physician Assistant

## 2016-11-04 ENCOUNTER — Ambulatory Visit
Admission: RE | Admit: 2016-11-04 | Discharge: 2016-11-04 | Disposition: A | Discharge status: Home / Self Care | Payer: PPO | Source: Ambulatory Visit | Attending: Physician Assistant | Admitting: Physician Assistant

## 2016-11-04 DIAGNOSIS — R7989 Other specified abnormal findings of blood chemistry: Secondary | ICD-10-CM

## 2016-11-04 DIAGNOSIS — R748 Abnormal levels of other serum enzymes: Secondary | ICD-10-CM

## 2016-11-04 DIAGNOSIS — R945 Abnormal results of liver function studies: Principal | ICD-10-CM

## 2016-11-04 NOTE — Progress Notes (Addendum)
Kayla Baker is here for a one month follow up appointment for cancer of left breast.  Skin status: Her skin has healed. What lotion are you using? She is not using any special creams at this time Have you seen med onc? If not, when is appointment: 11/06/16 If they are ER+, have they started Al or Tamoxifen? If not, why? Return to clinic in 3 months after radiation is complete to start antiestrogen therapy. She saw [name] and plans to start Letrozole is to be started 12/01/16 Discussed survivorship appointment.  Have you had a mammogram scheduled? Not scheduled yet Offer referral to [facility]. Will receive at the survivorship appointment. Appetite: Yes Pain: She denies Fatigue: Some mild fatigue, but improved from radiation.  Arm mobility: She has no difficulty with arm movement.  Had US Abdomen Limited RUQ for elevated liver function test 11-04-16, no gallstones  BP 131/78   Pulse 73   Temp 97.9 F (36.6 C)   Ht 5' 4"  (1.626 m)   Wt 156 lb 12.8 oz (71.1 kg)   SpO2 100% Comment: room air  BMI 26.91 kg/m    Wt Readings from Last 3 Encounters:  11/06/16 156 lb 12.8 oz (71.1 kg)  11/06/16 156 lb (70.8 kg)  10/09/16 159 lb (72.1 kg)

## 2016-11-05 NOTE — Assessment & Plan Note (Signed)
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 Based on this, I strongly recommend systemic chemotherapy.  Adjuvant chemotherapy with dose dense Adriamycin and Cytoxan 4 followed by Abraxane weekly 8 ( stopped due to neuropathy) 04/17/2016 to 07/31/2016  Adj XRT from 10/2 to 09/26/16  Chemo induced neuropathy Plan: Start anti estrogen therapy with Letrozole 2.5 mg daily

## 2016-11-06 ENCOUNTER — Encounter: Payer: Self-pay | Admitting: Radiation Oncology

## 2016-11-06 ENCOUNTER — Ambulatory Visit (HOSPITAL_BASED_OUTPATIENT_CLINIC_OR_DEPARTMENT_OTHER): Payer: PPO | Admitting: Hematology and Oncology

## 2016-11-06 ENCOUNTER — Encounter: Payer: Self-pay | Admitting: Hematology and Oncology

## 2016-11-06 ENCOUNTER — Ambulatory Visit (HOSPITAL_BASED_OUTPATIENT_CLINIC_OR_DEPARTMENT_OTHER): Payer: PPO

## 2016-11-06 ENCOUNTER — Other Ambulatory Visit (HOSPITAL_BASED_OUTPATIENT_CLINIC_OR_DEPARTMENT_OTHER): Payer: PPO

## 2016-11-06 ENCOUNTER — Ambulatory Visit
Admission: RE | Admit: 2016-11-06 | Discharge: 2016-11-06 | Disposition: A | Discharge status: Home / Self Care | Payer: PPO | Source: Ambulatory Visit | Attending: Radiation Oncology | Admitting: Radiation Oncology

## 2016-11-06 DIAGNOSIS — Z452 Encounter for adjustment and management of vascular access device: Secondary | ICD-10-CM | POA: Diagnosis not present

## 2016-11-06 DIAGNOSIS — C50912 Malignant neoplasm of unspecified site of left female breast: Secondary | ICD-10-CM | POA: Diagnosis not present

## 2016-11-06 DIAGNOSIS — Z888 Allergy status to other drugs, medicaments and biological substances status: Secondary | ICD-10-CM | POA: Insufficient documentation

## 2016-11-06 DIAGNOSIS — C50212 Malignant neoplasm of upper-inner quadrant of left female breast: Secondary | ICD-10-CM

## 2016-11-06 DIAGNOSIS — Z170 Estrogen receptor positive status [ER+]: Secondary | ICD-10-CM

## 2016-11-06 DIAGNOSIS — Z923 Personal history of irradiation: Secondary | ICD-10-CM | POA: Diagnosis not present

## 2016-11-06 DIAGNOSIS — Z7982 Long term (current) use of aspirin: Secondary | ICD-10-CM | POA: Insufficient documentation

## 2016-11-06 DIAGNOSIS — Z881 Allergy status to other antibiotic agents status: Secondary | ICD-10-CM | POA: Diagnosis not present

## 2016-11-06 DIAGNOSIS — G620 Drug-induced polyneuropathy: Secondary | ICD-10-CM | POA: Diagnosis not present

## 2016-11-06 DIAGNOSIS — Z95828 Presence of other vascular implants and grafts: Secondary | ICD-10-CM

## 2016-11-06 LAB — COMPREHENSIVE METABOLIC PANEL
ALK PHOS: 159 U/L — AB (ref 40–150)
ALT: 58 U/L — AB (ref 0–55)
ANION GAP: 9 meq/L (ref 3–11)
AST: 52 U/L — ABNORMAL HIGH (ref 5–34)
Albumin: 3.7 g/dL (ref 3.5–5.0)
BILIRUBIN TOTAL: 0.53 mg/dL (ref 0.20–1.20)
BUN: 18 mg/dL (ref 7.0–26.0)
CALCIUM: 10.2 mg/dL (ref 8.4–10.4)
CO2: 27 mEq/L (ref 22–29)
CREATININE: 1.3 mg/dL — AB (ref 0.6–1.1)
Chloride: 105 mEq/L (ref 98–109)
EGFR: 38 mL/min/{1.73_m2} — ABNORMAL LOW (ref >90)
Glucose: 130 mg/dl (ref 70–140)
Potassium: 3.9 mEq/L (ref 3.5–5.1)
Sodium: 141 mEq/L (ref 136–145)
TOTAL PROTEIN: 7.1 g/dL (ref 6.4–8.3)

## 2016-11-06 LAB — CBC WITH DIFFERENTIAL/PLATELET
BASO%: 0.5 % (ref 0.0–2.0)
BASOS ABS: 0 10*3/uL (ref 0.0–0.1)
EOS ABS: 0.2 10*3/uL (ref 0.0–0.5)
EOS%: 3.8 % (ref 0.0–7.0)
HEMATOCRIT: 38 % (ref 34.8–46.6)
HGB: 12.9 g/dL (ref 11.6–15.9)
LYMPH%: 36.5 % (ref 14.0–49.7)
MCH: 30.6 pg (ref 25.1–34.0)
MCHC: 33.9 g/dL (ref 31.5–36.0)
MCV: 90 fL (ref 79.5–101.0)
MONO#: 0.5 10*3/uL (ref 0.1–0.9)
MONO%: 11.5 % (ref 0.0–14.0)
NEUT%: 47.7 % (ref 38.4–76.8)
NEUTROS ABS: 2 10*3/uL (ref 1.5–6.5)
NRBC: 0 % (ref 0–0)
PLATELETS: 89 10*3/uL — AB (ref 145–400)
RBC: 4.22 10*6/uL (ref 3.70–5.45)
RDW: 14.1 % (ref 11.2–14.5)
WBC: 4.3 10*3/uL (ref 3.9–10.3)
lymph#: 1.6 10*3/uL (ref 0.9–3.3)

## 2016-11-06 MED ORDER — SODIUM CHLORIDE 0.9% FLUSH
10.0000 mL | INTRAVENOUS | Status: DC | PRN
  Administered 2016-11-06: 10 mL via INTRAVENOUS
  Filled 2016-11-06: qty 10

## 2016-11-06 MED ORDER — LETROZOLE 2.5 MG PO TABS: 2.5000 mg | ORAL_TABLET | Freq: Every day | ORAL | 3 refills | Status: DC

## 2016-11-06 MED ORDER — HEPARIN SOD (PORK) LOCK FLUSH 100 UNIT/ML IV SOLN
500.0000 [IU] | Freq: Once | INTRAVENOUS | Status: AC | PRN
  Administered 2016-11-06: 500 [IU] via INTRAVENOUS
  Filled 2016-11-06: qty 5

## 2016-11-06 NOTE — Progress Notes (Signed)
Radiation Oncology         ([phone]) [phone] ________________________________  Name: Kayla Baker MRN: [id number]  Date: 11/06/2016  DOB: 03/01/1936  Post Treatment Note  CC: Alesia Richards, MD  Fanny Skates, MD  Diagnosis:   Stage IIB, T3N0 invasive ductal carcinoma of the left breast with high grade DCIS  Interval Since Last Radiation:  5 weeks   09/01/16 - 09/26/16 1.  Left breast: 42.5 Gy in 17 fractions. 2.  Left breast boost: 7.5 Gy in 3 fractions.  Narrative:  The patient returns today for routine follow-up.  She tolerated radiotherapy without desquamation.                              On review of systems, the patient states she is doing well overall. She denies concerns with her skin, fatigue, chest pain or shortness of breath. No other complaints are noted.  ALLERGIES:  is allergic to keflex [cephalexin]; meloxicam; prednisone; premarin [estrogens conjugated]; and trovan [alatrofloxacin].  Meds: Current Outpatient Prescriptions  Medication Sig Dispense Refill  . allopurinol (ZYLOPRIM) 300 MG tablet TAKE 1 TABLET BY MOUTH EVERY DAY 90 tablet 1  . amitriptyline (ELAVIL) 25 MG tablet TAKE 1 TO 2 TABLETS BY MOUTH AT BEDTIME FOR NEURITIS 180 tablet 1  . aspirin 81 MG tablet Take 81 mg by mouth daily.    . furosemide (LASIX) 20 MG tablet Take 2 tablets (40 mg total) by mouth daily. 30 tablet 2  . gabapentin (NEURONTIN) 100 MG capsule Take 1 to 3 capsules at bedtime for neuritis 90 capsule 5  . glucose blood (FREESTYLE LITE) test strip USE ONE STRIP TO CHECK GLUCOSE ONCE DAILY. DX-E11.22 100 each 3  . Lancets (FREESTYLE) lancets Check blood sugar one time daily-E11.22 100 each 3  . metoprolol succinate (TOPROL XL) 25 MG 24 hr tablet Take 1 tablet (25 mg total) by mouth daily. 30 tablet 3  . vitamin C (ASCORBIC ACID) 500 MG tablet Take 500 mg by mouth daily. Takes 2 tablets daily    . atorvastatin (LIPITOR) 40 MG tablet TAKE 1 TABLET BY MOUTH EVERY DAY (Patient not taking:  Reported on 11/06/2016) 90 tablet 1  . hyaluronate sodium (RADIAPLEXRX) GEL Apply 1 application topically 2 (two) times daily.    Derrill Memo ON 12/01/2016] letrozole (FEMARA) 2.5 MG tablet Take 1 tablet (2.5 mg total) by mouth daily. (Patient not taking: Reported on 11/06/2016) 90 tablet 3  . ranitidine (ZANTAC) 300 MG tablet Take twice a day as needed for Heartburn & Acid Indigestion (Patient not taking: Reported on 11/06/2016) 60 tablet 3   No current facility-administered medications for this encounter.     Physical Findings:  height is 5' 4"  (1.626 m) and weight is 156 lb 12.8 oz (71.1 kg). Her temperature is 97.9 F (36.6 C). Her blood pressure is 131/78 and her pulse is 73. Her oxygen saturation is 100%.  In general this is a well appearing caucasian female in no acute distress. She's alert and oriented x4 and appropriate throughout the examination. Cardiopulmonary assessment is negative for acute distress and she exhibits normal effort.   Lab Findings: Lab Results  Component Value Date   WBC 4.3 11/06/2016   HGB 12.9 11/06/2016   HCT 38.0 11/06/2016   MCV 90.0 11/06/2016   PLT 89 (L) 11/06/2016     Radiographic Findings: US Abdomen Limited Ruq  Result Date: 11/04/2016 CLINICAL DATA:  Elevated liver function tests, history  of breast carcinoma, just completed treatment EXAM: US ABDOMEN LIMITED - RIGHT UPPER QUADRANT COMPARISON:  None. FINDINGS: Gallbladder: The gallbladder is visualized and no gallstones are noted. There is no pain over the gallbladder with compression. Common bile duct: Diameter: The common bile duct is normal measuring 3.5 mm in diameter. Liver: The liver is echogenic and inhomogeneous consistent with fatty infiltration. No focal hepatic abnormality is seen. IMPRESSION: 1. No gallstones. 2. Echogenic inhomogeneous liver parenchyma consistent with diffuse fatty infiltration. Electronically Signed   By: Ivar Drape M.D.   On: 11/04/2016 09:59    Impression/Plan: 1. Stage  IIB, T3N0 invasive ductal carcinoma of the left breast with high grade DCIS. The patient has tolerated radiotherapy well and will begin antiestrogen with [name]. We would be happy to see her back as needed moving forward. She states agreement and understanding and will call if she has questions or concerns .    Carola Rhine, PAC

## 2016-11-06 NOTE — Progress Notes (Signed)
Patient Care Team: [name], MD as PCP - General Macarthur Critchley, OD as Referring Physician (Optometry) [name], MD as Consulting Physician (Dermatology) Latanya Maudlin, MD as Consulting Physician (Orthopedic Surgery) Inda Castle, MD as Consulting Physician (Gastroenterology) [name], MD as Consulting Physician (Hematology and Oncology)  DIAGNOSIS:  Encounter Diagnosis  Name Primary?  . Malignant neoplasm of upper-inner quadrant of left breast in female, estrogen receptor positive ([place])     SUMMARY OF ONCOLOGIC HISTORY:   Breast cancer of upper-inner quadrant of left female breast ([place])   02/26/2016 Initial Diagnosis    Left breast biopsy 11:00 position: invasive ductal carcinoma with DCIS, ER 90%, PR 10%, HER-2 negative, Ki-67 30%, grade 2, 2.2 cm palpable lesion T2 N0 stage II a clinical stage      03/18/2016 Surgery    Left lumpectomy: Invasive ductal carcinoma, grade 2, 6.3 cm, with high-grade DCIS, margins negative, 0/4 lymph nodes negative, ER 90%, via 10%, HER-2 negative ratio 0.97, Ki-67 30%, T3 N0 stage IIB      03/25/2016 Procedure    Genetic testing is negative for pathogenic mutations within any of the 20 Genes on the breast/ovarian cancer panel      04/04/2016 Oncotype testing    Oncotype DX recurrence score 37, 25% 10 year distant risk of recurrence      04/17/2016 - 07/31/2016 Chemotherapy    Adjuvant chemotherapy with dose dense Adriamycin and Cytoxan followed by Abraxane weekly 8 ( discontinued due to neuropathy)      09/01/2016 - 09/26/2016 Radiation Therapy    Adj XRT       CHIEF COMPLIANT: Follow-up after adjuvant radiation therapy  INTERVAL HISTORY: Kayla Baker is a 80 year old lady with above-mentioned history left breast cancer treated with lumpectomy followed by adjuvant chemotherapy. The adjuvant chemotherapy was stopped prematurely because of neuropathy. She completed adjuvant radiation therapy and is here today to follow-up. She has  recovered fairly well from the effects of radiation. Neuropathy also appears to be improving slowly with time.  REVIEW OF SYSTEMS:   Constitutional: Denies fevers, chills or abnormal weight loss Eyes: Denies blurriness of vision Ears, nose, mouth, throat, and face: Denies mucositis or sore throat Respiratory: Denies cough, dyspnea or wheezes Cardiovascular: Denies palpitation, chest discomfort Gastrointestinal:  Denies nausea, heartburn or change in bowel habits Skin: Denies abnormal skin rashes Lymphatics: Denies new lymphadenopathy or easy bruising Neurological:Denies numbness, tingling or new weaknesses Behavioral/Psych: Mood is stable, no new changes  Extremities: No lower extremity edema Breast:  denies any pain or lumps or nodules in either breasts, recovered from radiation All other systems were reviewed with the patient and are negative.  I have reviewed the past medical history, past surgical history, social history and family history with the patient and they are unchanged from previous note.  ALLERGIES:  is allergic to keflex [cephalexin]; meloxicam; prednisone; premarin [estrogens conjugated]; and trovan [alatrofloxacin].  MEDICATIONS:  Current Outpatient Prescriptions  Medication Sig Dispense Refill  . allopurinol (ZYLOPRIM) 300 MG tablet TAKE 1 TABLET BY MOUTH EVERY DAY 90 tablet 1  . amitriptyline (ELAVIL) 25 MG tablet TAKE 1 TO 2 TABLETS BY MOUTH AT BEDTIME FOR NEURITIS 180 tablet 1  . aspirin 81 MG tablet Take 81 mg by mouth daily.    Marland Kitchen atorvastatin (LIPITOR) 40 MG tablet TAKE 1 TABLET BY MOUTH EVERY DAY 90 tablet 1  . furosemide (LASIX) 20 MG tablet Take 2 tablets (40 mg total) by mouth daily. 30 tablet 2  . gabapentin (NEURONTIN)  100 MG capsule Take 1 to 3 capsules at bedtime for neuritis 90 capsule 5  . glucose blood (FREESTYLE LITE) test strip USE ONE STRIP TO CHECK GLUCOSE ONCE DAILY. DX-E11.22 100 each 3  . hyaluronate sodium (RADIAPLEXRX) GEL Apply 1 application  topically 2 (two) times daily.    . Lancets (FREESTYLE) lancets Check blood sugar one time daily-E11.22 100 each 3  . [START ON 12/01/2016] letrozole (FEMARA) 2.5 MG tablet Take 1 tablet (2.5 mg total) by mouth daily. 90 tablet 3  . metoprolol succinate (TOPROL XL) 25 MG 24 hr tablet Take 1 tablet (25 mg total) by mouth daily. 30 tablet 3  . ranitidine (ZANTAC) 300 MG tablet Take twice a day as needed for Heartburn & Acid Indigestion 60 tablet 3  . vitamin C (ASCORBIC ACID) 500 MG tablet Take 500 mg by mouth daily. Takes 2 tablets daily     No current facility-administered medications for this visit.     PHYSICAL EXAMINATION: ECOG PERFORMANCE STATUS: 1 - Symptomatic but completely ambulatory  Vitals:   11/06/16 1122  BP: (!) 142/76  Pulse: 83  Resp: 17  Temp: 97.7 F (36.5 C)   Filed Weights   11/06/16 1122  Weight: 156 lb (70.8 kg)    GENERAL:alert, no distress and comfortable SKIN: skin color, texture, turgor are normal, no rashes or significant lesions EYES: normal, Conjunctiva are pink and non-injected, sclera clear OROPHARYNX:no exudate, no erythema and lips, buccal mucosa, and tongue normal  NECK: supple, thyroid normal size, non-tender, without nodularity LYMPH:  no palpable lymphadenopathy in the cervical, axillary or inguinal LUNGS: clear to auscultation and percussion with normal breathing effort HEART: regular rate & rhythm and no murmurs and no lower extremity edema ABDOMEN:abdomen soft, non-tender and normal bowel sounds MUSCULOSKELETAL:no cyanosis of digits and no clubbing  NEURO: alert & oriented x 3 with fluent speech, no focal motor/sensory deficits EXTREMITIES: No lower extremity edema  LABORATORY DATA:  I have reviewed the data as listed   Chemistry      Component Value Date/Time   NA 141 11/06/2016 1102   K 3.9 11/06/2016 1102   CL 105 10/09/2016 1002   CO2 27 11/06/2016 1102   BUN 18.0 11/06/2016 1102   CREATININE 1.3 (H) 11/06/2016 1102        Component Value Date/Time   CALCIUM 10.2 11/06/2016 1102   ALKPHOS 159 (H) 11/06/2016 1102   AST 52 (H) 11/06/2016 1102   ALT 58 (H) 11/06/2016 1102   BILITOT 0.53 11/06/2016 1102       Lab Results  Component Value Date   WBC 4.3 11/06/2016   HGB 12.9 11/06/2016   HCT 38.0 11/06/2016   MCV 90.0 11/06/2016   PLT 89 (L) 11/06/2016   NEUTROABS 2.0 11/06/2016    ASSESSMENT & PLAN:  Breast cancer of upper-inner quadrant of left female breast ([place]) Left lumpectomy 03/08/2016: Invasive ductal carcinoma, grade 2, 6.3 cm, with high-grade DCIS, margins negative, 0/4 lymph nodes negative, ER 90%, via 10%, HER-2 negative ratio 0.97, Ki-67 30%, T3 N0 stage IIB  Oncotype DX: Patient has a score of 37 which translates into 25% 10 year risk of distant recurrence with tamoxifen alone. Based on this, I strongly recommend systemic chemotherapy. Adjuvant chemotherapy with dose dense Adriamycin and Cytoxan 4 followed by Abraxane weekly 8 ( stopped due to neuropathy) 04/17/2016 to 07/31/2016 Adj XRT from 10/2 to 09/26/16  Chemo induced neuropathy: Being monitored Plan: Start anti estrogen therapy with Letrozole 2.5 mg daily patient will  start this generally first 2018 Letrozole counseling:We discussed the risks and benefits of anti-estrogen therapy with aromatase inhibitors. These include but not limited to insomnia, hot flashes, mood changes, vaginal dryness, bone density loss, and weight gain. We strongly believe that the benefits far outweigh the risks. Patient understands these risks and consented to starting treatment. Planned treatment duration is 5 years.  Return to clinic in 4 months No orders of the defined types were placed in this encounter.  The patient has a good understanding of the overall plan. she agrees with it. she will call with any problems that may develop before the next visit here.   Rulon Eisenmenger, MD 11/06/16

## 2016-11-14 ENCOUNTER — Other Ambulatory Visit: Payer: Self-pay | Admitting: General Surgery

## 2016-11-14 DIAGNOSIS — I10 Essential (primary) hypertension: Secondary | ICD-10-CM | POA: Diagnosis not present

## 2016-11-14 DIAGNOSIS — Z803 Family history of malignant neoplasm of breast: Secondary | ICD-10-CM | POA: Diagnosis not present

## 2016-11-14 DIAGNOSIS — Z90710 Acquired absence of both cervix and uterus: Secondary | ICD-10-CM | POA: Diagnosis not present

## 2016-11-14 DIAGNOSIS — E119 Type 2 diabetes mellitus without complications: Secondary | ICD-10-CM | POA: Diagnosis not present

## 2016-11-14 DIAGNOSIS — M109 Gout, unspecified: Secondary | ICD-10-CM | POA: Diagnosis not present

## 2016-11-14 DIAGNOSIS — C50212 Malignant neoplasm of upper-inner quadrant of left female breast: Secondary | ICD-10-CM | POA: Diagnosis not present

## 2016-11-14 DIAGNOSIS — E1142 Type 2 diabetes mellitus with diabetic polyneuropathy: Secondary | ICD-10-CM | POA: Diagnosis not present

## 2016-11-17 ENCOUNTER — Other Ambulatory Visit: Payer: Self-pay | Admitting: Hematology and Oncology

## 2016-11-17 NOTE — Patient Instructions (Signed)
Ranette M Gordan  11/17/2016   Your procedure is scheduled on: 11/27/2016    Report to [facility] Main  Entrance take [place]  elevators to 3rd floor to  [place] at   [phone].  Call this number if you have problems the morning of surgery [phone]   Remember: ONLY 1 PERSON MAY GO WITH YOU TO SHORT STAY TO GET  READY MORNING OF [place].  Do not eat food or drink liquids :After Midnight.     Take these medicines the morning of surgery with A SIP OF WATER:   Allopurinol, Claritin, Metoprolol ( Toprol), Zantac if needed                                 You may not have any metal on your body including hair pins and              piercings  Do not wear jewelry, make-up, lotions, powders or perfumes, deodorant             Do not wear nail polish.  Do not shave  48 hours prior to surgery.                 Do not bring valuables to the hospital. [place].  Contacts, dentures or bridgework may not be worn into surgery.      Patients discharged the day of surgery will not be allowed to drive home.  Name and phone number of your driver:  Special Instructions: N/A              Please read over the following fact sheets you were given: _____________________________________________________________________             [facility] - Preparing for Surgery Before surgery, you can play an important role.  Because skin is not sterile, your skin needs to be as free of germs as possible.  You can reduce the number of germs on your skin by washing with CHG (chlorahexidine gluconate) soap before surgery.  CHG is an antiseptic cleaner which kills germs and bonds with the skin to continue killing germs even after washing. Please DO NOT use if you have an allergy to CHG or antibacterial soaps.  If your skin becomes reddened/irritated stop using the CHG and inform your nurse when you arrive at Short Stay. Do not shave  (including legs and underarms) for at least 48 hours prior to the first CHG shower.  You may shave your face/neck. Please follow these instructions carefully:  1.  Shower with CHG Soap the night before surgery and the  morning of Surgery.  2.  If you choose to wash your hair, wash your hair first as usual with your  normal  shampoo.  3.  After you shampoo, rinse your hair and body thoroughly to remove the  shampoo.                           4.  Use CHG as you would any other liquid soap.  You can apply chg directly  to the skin and wash  Gently with a scrungie or clean washcloth.  5.  Apply the CHG Soap to your body ONLY FROM THE NECK DOWN.   Do not use on face/ open                           Wound or open sores. Avoid contact with eyes, ears mouth and genitals (private parts).                       Wash face,  Genitals (private parts) with your normal soap.             6.  Wash thoroughly, paying special attention to the area where your surgery  will be performed.  7.  Thoroughly rinse your body with warm water from the neck down.  8.  DO NOT shower/wash with your normal soap after using and rinsing off  the CHG Soap.                9.  Pat yourself dry with a clean towel.            10.  Wear clean pajamas.            11.  Place clean sheets on your bed the night of your first shower and do not  sleep with pets. Day of Surgery : Do not apply any lotions/deodorants the morning of surgery.  Please wear clean clothes to the hospital/surgery center.  FAILURE TO FOLLOW THESE INSTRUCTIONS MAY RESULT IN THE CANCELLATION OF YOUR SURGERY PATIENT SIGNATURE_________________________________  NURSE SIGNATURE__________________________________  ________________________________________________________________________

## 2016-11-18 ENCOUNTER — Encounter (HOSPITAL_COMMUNITY): Payer: Self-pay

## 2016-11-18 ENCOUNTER — Encounter (HOSPITAL_COMMUNITY)
Admission: RE | Admit: 2016-11-18 | Discharge: 2016-11-18 | Disposition: A | Discharge status: Home / Self Care | Payer: PPO | Source: Ambulatory Visit | Attending: General Surgery | Admitting: General Surgery

## 2016-11-18 DIAGNOSIS — C50919 Malignant neoplasm of unspecified site of unspecified female breast: Secondary | ICD-10-CM | POA: Insufficient documentation

## 2016-11-18 DIAGNOSIS — Z01812 Encounter for preprocedural laboratory examination: Secondary | ICD-10-CM | POA: Insufficient documentation

## 2016-11-18 HISTORY — DX: Personal history of irradiation: Z92.3

## 2016-11-18 HISTORY — DX: Personal history of antineoplastic chemotherapy: Z92.21

## 2016-11-18 LAB — CBC
HCT: 38.7 % (ref 36.0–46.0)
Hemoglobin: 13 g/dL (ref 12.0–15.0)
MCH: 30.1 pg (ref 26.0–34.0)
MCHC: 33.6 g/dL (ref 30.0–36.0)
MCV: 89.6 fL (ref 78.0–100.0)
Platelets: 99 10*3/uL — ABNORMAL LOW (ref 150–400)
RBC: 4.32 MIL/uL (ref 3.87–5.11)
RDW: 14.1 % (ref 11.5–15.5)
WBC: 4 10*3/uL (ref 4.0–10.5)

## 2016-11-18 LAB — BASIC METABOLIC PANEL
ANION GAP: 8 (ref 5–15)
BUN: 24 mg/dL — AB (ref 6–20)
CALCIUM: 10 mg/dL (ref 8.9–10.3)
CO2: 30 mmol/L (ref 22–32)
CREATININE: 1.33 mg/dL — AB (ref 0.44–1.00)
Chloride: 101 mmol/L (ref 101–111)
GFR calc Af Amer: 43 mL/min — ABNORMAL LOW (ref >60)
GFR, EST NON AFRICAN AMERICAN: 37 mL/min — AB (ref >60)
GLUCOSE: 248 mg/dL — AB (ref 65–99)
Potassium: 4.1 mmol/L (ref 3.5–5.1)
Sodium: 139 mmol/L (ref 135–145)

## 2016-11-18 LAB — GLUCOSE, CAPILLARY: GLUCOSE-CAPILLARY: 275 mg/dL — AB (ref 65–99)

## 2016-11-18 NOTE — Progress Notes (Signed)
CBC and BMP done 11/18/16 faxed via EPIC to Dr Dalbert Batman.

## 2016-11-18 NOTE — Progress Notes (Addendum)
EKG-12/21/15- EPIC  08/27/16- ECHO- EPIC  04/14/16- 1V CXR- EPIC  HGBA1C- 10/09/16- EPIC -6.9

## 2016-11-23 NOTE — H&P (Signed)
Kayla Baker Location: [facility] Patient #: [id number] DOB: [birth year] Married / Language: English / Race: White Female       History of Present Illness .       The patient is a 80 year old female who presents with breast cancer. This is a 80 year old female who returns for long-term follow-up regarding her left breast cancer. Her husband is with her today. [name] is her oncologist. Dr. Lisbeth Renshaw is her radiation oncologist. Dr. Melford Aase is her PCP.       On March 18, 2016 she underwent extensive left partial mastectomy and sentinel node biopsy and extensive tissue transfer. She did not want a mastectomy. 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 We were happy to have negative margins. All 3 lymph nodes were negative. Stage T3, N0. Receptor positive, HER-2 negative. We put a port in which she still has. She healed well. She has completed her chemotherapy. It may have been stopped short because of neuropathy. She has completed radiation therapy. She asks if she can have her port removed before the end of the year and we are going to check with [name] about that.        She has no problems with her wounds are her arms. Denies swelling or range of motion issues. She still complains of fatigue. I encouraged her to participate in the survivorship program and exercise more. She has no surgical problems.       We talked about plastic surgical issues and asymmetry. She is not sure she wants referral to a plastic surgeon at her age but I told her that would be reasonable and she will let me know.  She will start letrozole in January 2018. We have sent a message to [name] to see if we can remove the port---Approved. We will remove the port either in the operating room under sedation or in the office. She wants this done before the end of the year. I will try to accommodate her. Repeat bilateral mammograms in March or April 2018 Return to see me in April  2018 after she gets her mammograms.   Allergies  No Known Drug Allergies   Medication History  Allopurinol (300MG Tablet, Oral) Active. Amitriptyline HCl (25MG Tablet, Oral) Active. Atorvastatin Calcium (40MG Tablet, Oral) Active. Enalapril Maleate (20MG Tablet, Oral) Active. Gabapentin (100MG Capsule, Oral) Active. HydroCHLOROthiazide (25MG Tablet, Oral) Active. RaNITidine HCl (300MG Tablet, Oral) Active. Hydrocodone-Acetaminophen (5-325MG Tablet, Oral as needed) Active. Letrozole (2.5MG Tablet, Oral) Active. Furosemide (20MG Tablet, Oral) Active. Medications Reconciled  Vitals Weight: 155 lb Height: 64in Body Surface Area: 1.76 m Body Mass Index: 26.61 kg/m  Pulse: 88 (Regular)  BP: 126/70 (Sitting, Left Arm, Standard)     Physical Exam  General Mental Status-Alert. General Appearance-Not in acute distress. Build & Nutrition-Well nourished. Posture-Normal posture. Gait-Normal.  Head and Neck Head-normocephalic, atraumatic with no lesions or palpable masses. Trachea-midline. Thyroid Gland Characteristics - normal size and consistency and no palpable nodules.  Chest and Lung Exam Chest and lung exam reveals -on auscultation, normal breath sounds, no adventitious sounds and normal vocal resonance. Note: Port right infraclavicular area palpable without wound problems. Subclavian approach.   Breast Note: Left breast is shorter and smaller than right but still reasonable breast shape. The asymmetry remain stable. There is no mass in either breast. Skin is healthy. No axillary adenopathy. Full range of motion of her left shoulder. No arm swelling. No sensory deficit left arm.   Cardiovascular Cardiovascular  examination reveals -normal heart sounds, regular rate and rhythm with no murmurs and femoral artery auscultation bilaterally reveals normal pulses, no bruits, no thrills.  Abdomen Inspection Inspection of the abdomen  reveals - No Hernias. Palpation/Percussion Palpation and Percussion of the abdomen reveal - Soft, Non Tender, No Rigidity (guarding), No hepatosplenomegaly and No Palpable abdominal masses.  Neurologic Neurologic evaluation reveals -alert and oriented x 3 with no impairment of recent or remote memory, normal attention span and ability to concentrate, normal sensation and normal coordination.  Musculoskeletal Normal Exam - Bilateral-Upper Extremity Strength Normal and Lower Extremity Strength Normal.    Assessment & Plan  BREAST CANCER OF UPPER-INNER QUADRANT OF LEFT FEMALE BREAST (C50.212) Current Plans Follow up with [place] in the office in 4 MONTHS.  Examination of both breast and all of the regional lymph nodes today is normal There is no evidence of cancer We will plan to repeat bilateral mammograms in March or April, 2018 Return to see Dr. Dalbert Batman in April, 2018 after you gets her mammograms  We talked about whether or not to remove your Port-A-Cath. [name] has approved removal of the port. This surgery will be scheduled.  In terms of the asymmetry of your left breast compared to the right, it is stable We discussed whether or not to have plastic surgery revise this You know that this is an option. Let me know if you want a referral to plastic surgery for consultation.  TYPE 2 DIABETES MELLITUS WITHOUT COMPLICATION, WITHOUT LONG-TERM CURRENT USE OF INSULIN (E11.9) HYPERTENSION, BENIGN (I10) HISTORY OF ABDOMINAL HYSTERECTOMY (Z90.710) DIABETIC PERIPHERAL NEUROPATHY (E11.42) GOUT, ARTHRITIS (M10.9) FAMILY HISTORY OF BREAST CANCER (Z80.3)   Berton Butrick M. Dalbert Batman, M.D., [facility] General and Minimally invasive Surgery Breast and Colorectal Surgery Office:   [phone] Pager:   [phone]

## 2016-11-27 ENCOUNTER — Encounter (HOSPITAL_COMMUNITY): Payer: Self-pay | Admitting: *Deleted

## 2016-11-27 ENCOUNTER — Ambulatory Visit (HOSPITAL_COMMUNITY): Payer: PPO | Admitting: Anesthesiology

## 2016-11-27 ENCOUNTER — Encounter (HOSPITAL_COMMUNITY)
Admission: RE | Disposition: A | Discharge status: Home / Self Care | Payer: Self-pay | Source: Ambulatory Visit | Attending: General Surgery

## 2016-11-27 ENCOUNTER — Ambulatory Visit (HOSPITAL_COMMUNITY)
Admission: RE | Admit: 2016-11-27 | Discharge: 2016-11-27 | Disposition: A | Discharge status: Home / Self Care | Payer: PPO | Source: Ambulatory Visit | Attending: General Surgery | Admitting: General Surgery

## 2016-11-27 DIAGNOSIS — Z452 Encounter for adjustment and management of vascular access device: Secondary | ICD-10-CM | POA: Insufficient documentation

## 2016-11-27 DIAGNOSIS — Z853 Personal history of malignant neoplasm of breast: Secondary | ICD-10-CM | POA: Insufficient documentation

## 2016-11-27 DIAGNOSIS — Z803 Family history of malignant neoplasm of breast: Secondary | ICD-10-CM | POA: Diagnosis not present

## 2016-11-27 DIAGNOSIS — M109 Gout, unspecified: Secondary | ICD-10-CM | POA: Insufficient documentation

## 2016-11-27 DIAGNOSIS — C50212 Malignant neoplasm of upper-inner quadrant of left female breast: Secondary | ICD-10-CM | POA: Diagnosis present

## 2016-11-27 DIAGNOSIS — E1142 Type 2 diabetes mellitus with diabetic polyneuropathy: Secondary | ICD-10-CM | POA: Insufficient documentation

## 2016-11-27 DIAGNOSIS — I10 Essential (primary) hypertension: Secondary | ICD-10-CM | POA: Insufficient documentation

## 2016-11-27 DIAGNOSIS — Z79891 Long term (current) use of opiate analgesic: Secondary | ICD-10-CM | POA: Insufficient documentation

## 2016-11-27 DIAGNOSIS — E785 Hyperlipidemia, unspecified: Secondary | ICD-10-CM | POA: Diagnosis not present

## 2016-11-27 DIAGNOSIS — Z79899 Other long term (current) drug therapy: Secondary | ICD-10-CM | POA: Diagnosis not present

## 2016-11-27 DIAGNOSIS — Z9221 Personal history of antineoplastic chemotherapy: Secondary | ICD-10-CM | POA: Diagnosis not present

## 2016-11-27 DIAGNOSIS — Z923 Personal history of irradiation: Secondary | ICD-10-CM | POA: Insufficient documentation

## 2016-11-27 HISTORY — PX: PORT-A-CATH REMOVAL: SHX5289

## 2016-11-27 LAB — GLUCOSE, CAPILLARY: Glucose-Capillary: 126 mg/dL — ABNORMAL HIGH (ref 65–99)

## 2016-11-27 SURGERY — REMOVAL PORT-A-CATH
Anesthesia: General

## 2016-11-27 MED ORDER — 0.9 % SODIUM CHLORIDE (POUR BTL) OPTIME
TOPICAL | Status: DC | PRN
  Administered 2016-11-27: 1000 mL

## 2016-11-27 MED ORDER — PROPOFOL 10 MG/ML IV BOLUS
INTRAVENOUS | Status: AC
  Filled 2016-11-27: qty 20

## 2016-11-27 MED ORDER — ONDANSETRON HCL 4 MG/2ML IJ SOLN
INTRAMUSCULAR | Status: DC | PRN
  Administered 2016-11-27: 4 mg via INTRAVENOUS

## 2016-11-27 MED ORDER — BUPIVACAINE-EPINEPHRINE 0.5% -1:200000 IJ SOLN
INTRAMUSCULAR | Status: DC | PRN
  Administered 2016-11-27: 10 mL

## 2016-11-27 MED ORDER — LIDOCAINE HCL (CARDIAC) 20 MG/ML IV SOLN
INTRAVENOUS | Status: DC | PRN
  Administered 2016-11-27: 100 mg via INTRAVENOUS

## 2016-11-27 MED ORDER — CELECOXIB 200 MG PO CAPS
400.0000 mg | ORAL_CAPSULE | ORAL | Status: AC
  Administered 2016-11-27: 400 mg via ORAL
  Filled 2016-11-27 (×2): qty 2

## 2016-11-27 MED ORDER — ACETAMINOPHEN 500 MG PO TABS
1000.0000 mg | ORAL_TABLET | ORAL | Status: AC
  Administered 2016-11-27: 1000 mg via ORAL
  Filled 2016-11-27: qty 2

## 2016-11-27 MED ORDER — FENTANYL CITRATE (PF) 100 MCG/2ML IJ SOLN
INTRAMUSCULAR | Status: DC | PRN
  Administered 2016-11-27: 50 ug via INTRAVENOUS

## 2016-11-27 MED ORDER — CEFAZOLIN SODIUM-DEXTROSE 2-4 GM/100ML-% IV SOLN: 2.0000 g | INTRAVENOUS | Status: DC

## 2016-11-27 MED ORDER — PROPOFOL 10 MG/ML IV BOLUS
INTRAVENOUS | Status: DC | PRN
  Administered 2016-11-27: 150 mg via INTRAVENOUS

## 2016-11-27 MED ORDER — BUPIVACAINE HCL (PF) 0.5 % IJ SOLN
INTRAMUSCULAR | Status: AC
  Filled 2016-11-27: qty 30

## 2016-11-27 MED ORDER — ONDANSETRON HCL 4 MG/2ML IJ SOLN
INTRAMUSCULAR | Status: AC
  Filled 2016-11-27: qty 2

## 2016-11-27 MED ORDER — FENTANYL CITRATE (PF) 100 MCG/2ML IJ SOLN
INTRAMUSCULAR | Status: AC
  Filled 2016-11-27: qty 2

## 2016-11-27 MED ORDER — HYDROCODONE-ACETAMINOPHEN 5-325 MG PO TABS: 1.0000 | ORAL_TABLET | Freq: Four times a day (QID) | ORAL | 0 refills | Status: DC | PRN

## 2016-11-27 MED ORDER — LIDOCAINE 2% (20 MG/ML) 5 ML SYRINGE
INTRAMUSCULAR | Status: AC
  Filled 2016-11-27: qty 5

## 2016-11-27 MED ORDER — LACTATED RINGERS IV SOLN
INTRAVENOUS | Status: DC
  Administered 2016-11-27: 1000 mL via INTRAVENOUS

## 2016-11-27 MED ORDER — FENTANYL CITRATE (PF) 100 MCG/2ML IJ SOLN: 25.0000 ug | INTRAMUSCULAR | Status: DC | PRN

## 2016-11-27 MED ORDER — CHLORHEXIDINE GLUCONATE CLOTH 2 % EX PADS: 6.0000 | MEDICATED_PAD | Freq: Once | CUTANEOUS | Status: DC

## 2016-11-27 MED ORDER — ROCURONIUM BROMIDE 50 MG/5ML IV SOSY
PREFILLED_SYRINGE | INTRAVENOUS | Status: AC
  Filled 2016-11-27: qty 5

## 2016-11-27 SURGICAL SUPPLY — 26 items
BENZOIN TINCTURE PRP APPL 2/3 (GAUZE/BANDAGES/DRESSINGS) IMPLANT
CHLORAPREP W/TINT 26ML (MISCELLANEOUS) ×2 IMPLANT
COVER SURGICAL LIGHT HANDLE (MISCELLANEOUS) ×2 IMPLANT
DECANTER SPIKE VIAL GLASS SM (MISCELLANEOUS) ×2 IMPLANT
DERMABOND ADVANCED (GAUZE/BANDAGES/DRESSINGS) ×1
DERMABOND ADVANCED .7 DNX12 (GAUZE/BANDAGES/DRESSINGS) ×1 IMPLANT
DRAPE LAPAROTOMY TRNSV 102X78 (DRAPE) ×2 IMPLANT
ELECT PENCIL ROCKER SW 15FT (MISCELLANEOUS) ×2 IMPLANT
ELECT REM PT RETURN 9FT ADLT (ELECTROSURGICAL) ×2
ELECTRODE REM PT RTRN 9FT ADLT (ELECTROSURGICAL) ×1 IMPLANT
GAUZE SPONGE 4X4 16PLY XRAY LF (GAUZE/BANDAGES/DRESSINGS) ×2 IMPLANT
GLOVE BIOGEL PI IND STRL 7.0 (GLOVE) ×1 IMPLANT
GLOVE BIOGEL PI INDICATOR 7.0 (GLOVE) ×1
GLOVE EUDERMIC 7 POWDERFREE (GLOVE) ×2 IMPLANT
GOWN STRL REUS W/TWL LRG LVL3 (GOWN DISPOSABLE) ×2 IMPLANT
GOWN STRL REUS W/TWL XL LVL3 (GOWN DISPOSABLE) ×4 IMPLANT
KIT BASIN OR (CUSTOM PROCEDURE TRAY) ×2 IMPLANT
NEEDLE HYPO 25X1 1.5 SAFETY (NEEDLE) ×2 IMPLANT
PACK BASIC VI WITH GOWN DISP (CUSTOM PROCEDURE TRAY) ×2 IMPLANT
STRIP CLOSURE SKIN 1/2X4 (GAUZE/BANDAGES/DRESSINGS) IMPLANT
SUT MNCRL AB 4-0 PS2 18 (SUTURE) ×2 IMPLANT
SUT VIC AB 3-0 SH 27 (SUTURE) ×1
SUT VIC AB 3-0 SH 27XBRD (SUTURE) ×1 IMPLANT
SYR BULB IRRIGATION 50ML (SYRINGE) ×2 IMPLANT
SYR CONTROL 10ML LL (SYRINGE) ×2 IMPLANT
TOWEL OR 17X26 10 PK STRL BLUE (TOWEL DISPOSABLE) ×2 IMPLANT

## 2016-11-27 NOTE — Anesthesia Procedure Notes (Signed)
Procedure Name: LMA Insertion Date/Time: 11/27/2016 9:25 AM Performed by: Glory Buff Pre-anesthesia Checklist: Patient identified, Emergency Drugs available, Suction available and Patient being monitored Patient Re-evaluated:Patient Re-evaluated prior to inductionOxygen Delivery Method: Circle system utilized Preoxygenation: Pre-oxygenation with 100% oxygen Intubation Type: IV induction LMA: LMA inserted LMA Size: 4.0 Number of attempts: 1 Placement Confirmation: positive ETCO2 Tube secured with: Tape Dental Injury: Teeth and Oropharynx as per pre-operative assessment

## 2016-11-27 NOTE — Transfer of Care (Signed)
Immediate Anesthesia Transfer of Care Note  Patient: Kayla Baker  Procedure(s) Performed: Procedure(s): REMOVAL PORT-A-CATH (N/A)  Patient Location: PACU  Anesthesia Type:General  Level of Consciousness: awake, alert  and oriented  Airway & Oxygen Therapy: Patient Spontanous Breathing and Patient connected to face mask oxygen  Post-op Assessment: Report given to RN and Post -op Vital signs reviewed and stable  Post vital signs: Reviewed and stable  Last Vitals:  Vitals:   11/27/16 0732  BP: (!) 140/59  Pulse: 94  Resp: 16  Temp: 36.4 C    Last Pain:  Vitals:   11/27/16 0749  TempSrc:   PainSc: 3       Patients Stated Pain Goal: 3 ([id number])  Complications: No apparent anesthesia complications

## 2016-11-27 NOTE — Anesthesia Preprocedure Evaluation (Addendum)
Anesthesia Evaluation  Patient identified by MRN, date of birth, ID band Patient awake    Reviewed: Allergy & Precautions, H&P , Patient's Chart, lab work & pertinent test results, reviewed documented beta blocker date and time   Airway Mallampati: II  TM Distance: >3 FB Neck ROM: full    Dental no notable dental hx.    Pulmonary    Pulmonary exam normal breath sounds clear to auscultation       Cardiovascular hypertension,  Rhythm:regular Rate:Normal     Neuro/Psych    GI/Hepatic   Endo/Other  diabetes  Renal/GU      Musculoskeletal   Abdominal   Peds  Hematology   Anesthesia Other Findings PONV         Hypertension takes Enalapril and HCTZ daily Peripheral neuropathy takes Gabapentin and Elavil daily GERD      type 2 diabetes mellitus   Hx of radiation therapy   History of chemotherapy         Reproductive/Obstetrics                             Anesthesia Physical Anesthesia Plan  ASA: III  Anesthesia Plan: General   Post-op Pain Management:    Induction: Intravenous  Airway Management Planned: LMA  Additional Equipment:   Intra-op Plan:   Post-operative Plan:   Informed Consent: I have reviewed the patients History and Physical, chart, labs and discussed the procedure including the risks, benefits and alternatives for the proposed anesthesia with the patient or authorized representative who has indicated his/her understanding and acceptance.   Dental Advisory Given and Dental advisory given  Plan Discussed with: CRNA and Surgeon  Anesthesia Plan Comments: (Discussed GA with LMA, possible sore throat, potential need to switch to ETT, N/V, pulmonary aspiration. Questions answered. )        Anesthesia Quick Evaluation

## 2016-11-27 NOTE — Interval H&P Note (Signed)
History and Physical Interval Note:  11/27/2016 8:44 AM  Kayla Baker  has presented today for surgery, with the diagnosis of Breast Cancer  The various methods of treatment have been discussed with the patient and family. After consideration of risks, benefits and other options for treatment, the patient has consented to  Procedure(s): REMOVAL PORT-A-CATH (N/A) as a surgical intervention .  The patient's history has been reviewed, patient examined, no change in status, stable for surgery.  I have reviewed the patient's chart and labs.  Questions were answered to the patient's satisfaction.     [name]

## 2016-11-27 NOTE — Op Note (Signed)
Patient Name:           Kayla Baker   Date of Surgery:        11/27/2016  Pre op Diagnosis:      Breast cancer upper inner quadrant left female breast  Post op Diagnosis:    Same  Procedure:                 Removal of Port-A-Cath  Surgeon:                     Edsel Petrin. Dalbert Batman, M.D., FACS  Assistant:                      None   Indication for Assistant: N/A  Operative Indications:     . This is a 80 year old female who returns for long-term follow-up regarding her left breast cancer. Her husband is with her today. [name] is her oncologist. Dr. Lisbeth Renshaw is her radiation oncologist. Dr. Melford Aase is her PCP.       On March 18, 2016 she underwent extensive left partial mastectomy and sentinel node biopsy and extensive tissue transfer. She did not want a mastectomy. 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 We were happy to have negative margins. All 3 lymph nodes were negative. Stage T3, N0. Receptor positive, HER-2 negative. We put a port in which she still has. She healed well. She has completed her chemotherapy. She has completed radiation therapy. She asks if she can have her port removed before the end of the year and [name] has approved removal of the port.      She will start letrozole in January 2018. We will remove the port either in the operating room under sedation.   Operative Findings:       The port and catheter were removed intact.  There is no sign of bleeding or infection.  Procedure in Detail:          The patient was brought to the operating room.  She was heavily sedated and monitored by the anesthesia department.  Surgical timeout was performed.  The right anterior chest and lower right neck were prepped and draped in a sterile fashion.  0.5% Marcaine was used as local infiltration anesthetic.  A transverse incision was made in the right infraclavicular area through the all scar.  I dissected the port away from the surrounding capsule.  I cut and  removed all 3 Prolene sutures.  The port and catheter were removed intact.  Subcutaneous tissue was closed with interrupted 3-0 Vicryl sutures and the skin closed with a running subcuticular 4-0 Monocryl and Dermabond.  Patient tolerated the procedure well was taken to PACU in stable condition.  EBL less than 10 mL.  Counts correct.  Complications none.     Edsel Petrin. Dalbert Batman, M.D., FACS General and Minimally Invasive Surgery Breast and Colorectal Surgery  11/27/2016 9:50 AM

## 2016-11-27 NOTE — Discharge Instructions (Signed)
Keep the wound clean and dry for 24 hours You may take a shower starting tomorrow No tub baths or swimming for 3 weeks The clear plastic superglue on the incision should wear off in about 3 weeks  Stay inside the house for 24 hours You may walk around the block tomorrow if you wish You may drive your car in a few days  Remember to get your mammograms in March or April See Dr. Dalbert Batman in April after you get the mammograms

## 2016-11-27 NOTE — Anesthesia Postprocedure Evaluation (Signed)
Anesthesia Post Note  Patient: Kayla Baker  Procedure(s) Performed: Procedure(s) (LRB): REMOVAL PORT-A-CATH (N/A)  Patient location during evaluation: PACU Anesthesia Type: General Level of consciousness: sedated Pain management: satisfactory to patient Vital Signs Assessment: post-procedure vital signs reviewed and stable Respiratory status: spontaneous breathing Cardiovascular status: stable Anesthetic complications: no       Last Vitals:  Vitals:   11/27/16 1045 11/27/16 1115  BP: 132/76 (!) 122/97  Pulse: 75 77  Resp: 15 16  Temp: 36.4 C 36.4 C    Last Pain:  Vitals:   11/27/16 1115  TempSrc: Oral  PainSc:                  Riccardo Dubin

## 2016-12-01 HISTORY — PX: REDUCTION MAMMAPLASTY: SUR839

## 2016-12-03 DIAGNOSIS — H524 Presbyopia: Secondary | ICD-10-CM | POA: Diagnosis not present

## 2017-01-09 ENCOUNTER — Other Ambulatory Visit: Payer: Self-pay | Admitting: Internal Medicine

## 2017-01-09 ENCOUNTER — Ambulatory Visit (INDEPENDENT_AMBULATORY_CARE_PROVIDER_SITE_OTHER): Payer: PPO | Admitting: Internal Medicine

## 2017-01-09 ENCOUNTER — Encounter: Payer: Self-pay | Admitting: Internal Medicine

## 2017-01-09 VITALS — BP 120/76 | HR 84 | Temp 97.2°F | Resp 16 | Ht 63.5 in | Wt 156.0 lb

## 2017-01-09 DIAGNOSIS — N183 Chronic kidney disease, stage 3 (moderate): Secondary | ICD-10-CM | POA: Diagnosis not present

## 2017-01-09 DIAGNOSIS — E2839 Other primary ovarian failure: Secondary | ICD-10-CM

## 2017-01-09 DIAGNOSIS — Z170 Estrogen receptor positive status [ER+]: Secondary | ICD-10-CM

## 2017-01-09 DIAGNOSIS — Z136 Encounter for screening for cardiovascular disorders: Secondary | ICD-10-CM

## 2017-01-09 DIAGNOSIS — I10 Essential (primary) hypertension: Secondary | ICD-10-CM | POA: Diagnosis not present

## 2017-01-09 DIAGNOSIS — Z0000 Encounter for general adult medical examination without abnormal findings: Secondary | ICD-10-CM | POA: Diagnosis not present

## 2017-01-09 DIAGNOSIS — E1122 Type 2 diabetes mellitus with diabetic chronic kidney disease: Secondary | ICD-10-CM

## 2017-01-09 DIAGNOSIS — E782 Mixed hyperlipidemia: Secondary | ICD-10-CM

## 2017-01-09 DIAGNOSIS — M1000 Idiopathic gout, unspecified site: Secondary | ICD-10-CM | POA: Diagnosis not present

## 2017-01-09 DIAGNOSIS — C50212 Malignant neoplasm of upper-inner quadrant of left female breast: Secondary | ICD-10-CM

## 2017-01-09 DIAGNOSIS — E1140 Type 2 diabetes mellitus with diabetic neuropathy, unspecified: Secondary | ICD-10-CM

## 2017-01-09 DIAGNOSIS — Z0001 Encounter for general adult medical examination with abnormal findings: Secondary | ICD-10-CM

## 2017-01-09 DIAGNOSIS — E559 Vitamin D deficiency, unspecified: Secondary | ICD-10-CM | POA: Diagnosis not present

## 2017-01-09 DIAGNOSIS — Z1212 Encounter for screening for malignant neoplasm of rectum: Secondary | ICD-10-CM

## 2017-01-09 DIAGNOSIS — K219 Gastro-esophageal reflux disease without esophagitis: Secondary | ICD-10-CM

## 2017-01-09 DIAGNOSIS — Z79899 Other long term (current) drug therapy: Secondary | ICD-10-CM

## 2017-01-09 LAB — CBC WITH DIFFERENTIAL/PLATELET
BASOS PCT: 0 %
Basophils Absolute: 0 cells/uL (ref 0–200)
EOS ABS: 147 {cells}/uL (ref 15–500)
Eosinophils Relative: 3 %
HEMATOCRIT: 40.8 % (ref 35.0–45.0)
HEMOGLOBIN: 13.6 g/dL (ref 11.7–15.5)
LYMPHS ABS: 1519 {cells}/uL (ref 850–3900)
Lymphocytes Relative: 31 %
MCH: 30.6 pg (ref 27.0–33.0)
MCHC: 33.3 g/dL (ref 32.0–36.0)
MCV: 91.7 fL (ref 80.0–100.0)
MONO ABS: 637 {cells}/uL (ref 200–950)
MPV: 10.1 fL (ref 7.5–12.5)
Monocytes Relative: 13 %
NEUTROS ABS: 2597 {cells}/uL (ref 1500–7800)
Neutrophils Relative %: 53 %
Platelets: 111 10*3/uL — ABNORMAL LOW (ref 140–400)
RBC: 4.45 MIL/uL (ref 3.80–5.10)
RDW: 15.4 % — AB (ref 11.0–15.0)
WBC: 4.9 10*3/uL (ref 3.8–10.8)

## 2017-01-09 LAB — TSH: TSH: 3.46 m[IU]/L

## 2017-01-09 MED ORDER — LETROZOLE 2.5 MG PO TABS: ORAL_TABLET | ORAL | 1 refills | Status: DC

## 2017-01-09 MED ORDER — FUROSEMIDE 20 MG PO TABS: 40.0000 mg | ORAL_TABLET | Freq: Every day | ORAL | 2 refills | Status: DC

## 2017-01-09 MED ORDER — AMITRIPTYLINE HCL 25 MG PO TABS: ORAL_TABLET | ORAL | 1 refills | Status: DC

## 2017-01-09 MED ORDER — ALLOPURINOL 300 MG PO TABS: 300.0000 mg | ORAL_TABLET | Freq: Every day | ORAL | 1 refills | Status: DC

## 2017-01-09 MED ORDER — RANITIDINE HCL 300 MG PO TABS: ORAL_TABLET | ORAL | 3 refills | Status: DC

## 2017-01-09 MED ORDER — GABAPENTIN 100 MG PO CAPS: ORAL_CAPSULE | ORAL | 5 refills | Status: DC

## 2017-01-09 NOTE — Patient Instructions (Signed)

## 2017-01-09 NOTE — Progress Notes (Signed)
[facility] [name], M.D.    Uvaldo Bristle. Silverio Lay, P.A.-C      Starlyn Skeans, P.A.-C   [facility] [place]                [address] [place] [address] Telephone [phone] Telefax [phone]  Annual Screening/Preventative Visit & Comprehensive Evaluation &  Examination     This very nice 81 y.o. MWF presents for a Screening/Preventative Visit & comprehensive evaluation and management of multiple medical co-morbidities.  Patient has been followed for HTN, P T2_+NIDDM , Hyperlipidemia and Vitamin D Deficiency. Patient also has hx/o GERD controlled w/Ranitidine and Gout quiescent on Allopurinol.      Patient was dx/d with Breast cancer and underwent partial L Mastectomy for Triple (+) Breast Ca in April 2017 by Dr Fanny Skates. Patient then was treated by Dr Lindi Adie with Chemotx discontinued early for complications and side-effects (May-August, 2017) anemia, rash and neuropathy.  Thereafter she completed prophylactic Radiation (10/3-10/27) by Dr Kyung Rudd. Now patient has started Letrozole in January per Dr Lindi Adie.       HTN predates since 2000. Patient's BP has been controlled at home and patient denies any cardiac symptoms as chest pain, palpitations, shortness of breath, dizziness or ankle swelling. Today's BP is at goal - 120/76.      Patient's hyperlipidemia is controlled with diet and medications. Patient denies myalgias or other medication SE's. Last lipids were at goal albeit elevated Trig's:  Lab Results  Component Value Date   CHOL 144 10/09/2016   HDL 38 (L) 10/09/2016   LDLCALC 66 10/09/2016   TRIG 200 (H) 10/09/2016   CHOLHDL 3.8 10/09/2016      Patient has T2_NIDDM attempting control with diet and also has  w/CKD3 (GFR 43 ml/min) since 2010 and patient denies reactive hypoglycemic symptoms, visual blurring, diabetic polys, but does c/o tingling paresthesias of the feet and toes  bilaterally. Last A1c was not at goal:  Lab Results  Component Value Date   HGBA1C 6.9 (H) 10/09/2016      Finally, patient has history of Vitamin D Deficiency ("33" in 2008)  and last Vitamin D was at goal:  Lab Results  Component Value Date   VD25OH 60 10/09/2016   Current Outpatient Prescriptions on File Prior to Visit  Medication Sig  . acetaminophen325 MG  Take  every 6  hours as needed - headache.  Marland Kitchen aspirin 81 MG  Take 81 mg  daily.  Marland Kitchen VITAMIN D3 5000 units  Take 5,000 Units daily.  Marland Kitchen loratadine  10 MG  Take  daily as needed - allergies.  . Magnesium  250 MG  Take 500 mg  2 times daily.  . metoprolol succ-XL 25 MG  TAKE 1 TAB DAILY  . vitamin C  500 MG  Takes 2 tablets daily   Allergies  Allergen Reactions  . Keflex [Cephalexin] Swelling    Tongue and throat swells  . Meloxicam Swelling  . Prednisone Swelling    Can Not take High doses  . Premarin [Estrogens Conjugated] Hives  . Trovan [Alatrofloxacin] Other (See Comments)    Unknown reaction   Past Medical History:  Diagnosis Date  . Allergy   . Anemia   . Arthritis   . Breast cancer of upper-inner quadrant of left female breast ([place]) 02/29/2016   skin- 2016- squamous- on right upper arm  .  Diet-controlled type 2 diabetes mellitus ([place])    diet and exercsise,Has never been on meds, type II   . Diverticulosis   . GERD (gastroesophageal reflux disease)   . Gout    takes Allopurinol daily  . History of chemotherapy   . History of colon polyps    benign  . History of shingles   . Hx of radiation therapy   . Hyperlipidemia    takes Atorvastatin daily  . Hypertension    takes Enalapril and HCTZ daily  . Peripheral neuropathy (HCC)    takes Gabapentin and Elavil daily  . PONV (postoperative nausea and vomiting)   . Vitamin D deficiency    Health Maintenance  Topic Date Due  . OPHTHALMOLOGY EXAM  11/05/2016  . URINE MICROALBUMIN  12/20/2016  . HEMOGLOBIN A1C  04/08/2017  . FOOT EXAM  07/08/2017  .  COLONOSCOPY  03/31/2018  . TETANUS/TDAP  08/15/2019  . INFLUENZA VACCINE  Completed  . DEXA SCAN  Completed  . ZOSTAVAX  Completed  . PNA vac Low Risk Adult  Completed   Immunization History  Administered Date(s) Administered  . DTaP 07/01/2004  . Influenza Whole 08/15/2013  . Influenza, High Dose Seasonal PF 08/28/2014, 09/20/2015  . Pneumococcal Conjugate-13 09/20/2015  . Pneumococcal Polysaccharide-23 11/01/2009  . Td 08/10/2009  . Zoster 08/02/2011   Past Surgical History:  Procedure Laterality Date  . ABDOMINAL HYSTERECTOMY  1973  . CARDIAC CATHETERIZATION    . CATARACT EXTRACTION, BILATERAL  2014   right eye 2/14; left eye 3/3  . COLONOSCOPY    . HAND SURGERY Left 2012  . INCONTINENCE SURGERY  2006  . KNEE ARTHROSCOPY Right   . PORT-A-CATH REMOVAL N/A 11/27/2016   Procedure: REMOVAL PORT-A-CATH;  Surgeon: Fanny Skates, MD;  Location: [facility];  Service: General;  Laterality: N/A;  . PORTACATH PLACEMENT N/A 04/14/2016   Procedure: INSERTION PORT-A-CATH ;  Surgeon: Fanny Skates, MD;  Location: [facility];  Service: General;  Laterality: N/A;  . RADIOACTIVE SEED GUIDED MASTECTOMY WITH AXILLARY SENTINEL LYMPH NODE BIOPSY Left 03/18/2016   Procedure: RADIOACTIVE SEED GUIDED LEFT PARTIAL MASTECTOMY WITH AXILLARY SENTINEL LYMPH NODE BIOPSY AND BLUE DYE INJECTION;  Surgeon: Fanny Skates, MD;  Location: [facility];  Service: General;  Laterality: Left;   Family History  Problem Relation Age of Onset  . Stroke Mother 49  . Other Mother     history of hysterectomy after last childbirth  . Diabetes Father   . Alzheimer's disease Father   . Hypertension Sister   . Lung cancer Sister 26    smoker  . Other Sister     history of hysterectomy for fibroids  . Breast cancer Sister 46  . Esophageal cancer Maternal Aunt 74    not a smoker  . Heart attack Maternal Uncle 65  . Cirrhosis Sister   . Lung cancer Other     nephew dx. 59s; +smoker  . Epilepsy Other   . Other Other 12     great niece dx. benign ganglioglioma brain tumor; [facility]  . Epilepsy Other     no seizures in 2 years  . Parkinson's disease Maternal Aunt   . Stroke Maternal Grandmother   . Heart Problems Maternal Grandmother   . Pancreatic cancer Cousin 32    paternal 1st cousin  . Colon cancer Neg Hx   . Stomach cancer Neg Hx    Social History  Substance Use Topics  . Smoking status: Never Smoker  . Smokeless  tobacco: Never Used  . Alcohol use No    ROS Constitutional: Denies fever, chills, weight loss/gain, headaches, insomnia,  night sweats, and change in appetite. Does c/o fatigue. Eyes: Denies redness, blurred vision, diplopia, discharge, itchy, watery eyes.  ENT: Denies discharge, congestion, post nasal drip, epistaxis, sore throat, earache, hearing loss, dental pain, Tinnitus, Vertigo, Sinus pain, snoring.  Cardio: Denies chest pain, palpitations, irregular heartbeat, syncope, dyspnea, diaphoresis, orthopnea, PND, claudication, edema Respiratory: denies cough, dyspnea, DOE, pleurisy, hoarseness, laryngitis, wheezing.  Gastrointestinal: Denies dysphagia, heartburn, reflux, water brash, pain, cramps, nausea, vomiting, bloating, diarrhea, constipation, hematemesis, melena, hematochezia, jaundice, hemorrhoids Genitourinary: Denies dysuria, frequency, urgency, nocturia, hesitancy, discharge, hematuria, flank pain Breast: Breast lumps, nipple discharge, bleeding.  Musculoskeletal: Denies arthralgia, myalgia, stiffness, Jt. Swelling, pain, limp, and strain/sprain. Denies falls. Skin: Denies puritis, rash, hives, warts, acne, eczema, changing in skin lesion Neuro: No weakness, tremor, incoordination, spasms, paresthesia, pain Psychiatric: Denies confusion, memory loss, sensory loss. Denies Depression. Endocrine: Denies change in weight, skin, hair change, nocturia, and paresthesia, diabetic polys, visual blurring, hyper / hypo glycemic episodes.  Heme/Lymph: No excessive bleeding, bruising,  enlarged lymph nodes.  Physical Exam  BP 120/76   Pulse 84   Temp 97.2 F (36.2 C)   Resp 16   Ht 5' 3.5" (1.613 m)   Wt 156 lb (70.8 kg)   BMI 27.20 kg/m   General Appearance: Well nourished and in no apparent distress.  Eyes: PERRLA, EOMs, conjunctiva no swelling or erythema, normal fundi and vessels. Sinuses: No frontal/maxillary tenderness ENT/Mouth: EACs patent / TMs  nl. Nares clear without erythema, swelling, mucoid exudates. Oral hygiene is good. No erythema, swelling, or exudate. Tongue normal, non-obstructing. Tonsils not swollen or erythematous. Hearing normal.  Neck: Supple, thyroid normal. No bruits, nodes or JVD. Respiratory: Respiratory effort normal.  BS equal and clear bilateral without rales, rhonci, wheezing or stridor. Cardio: Heart sounds are normal with regular rate and rhythm and no murmurs, rubs or gallops. Peripheral pulses are normal and equal bilaterally without edema. No aortic or femoral bruits. Chest: symmetric with normal excursions and percussion. Breasts:  L Breast smaller than R  without lumps, nipple discharge, retractions, or fibrocystic changes.  Abdomen: Flat, soft with bowel sounds active. Nontender, no guarding, rebound, hernias, masses, or organomegaly.  Lymphatics: Non tender without lymphadenopathy.  Musculoskeletal: Full ROM all peripheral extremities, joint stability, 5/5 strength, and normal gait. Skin: Warm and dry without rashes, lesions, cyanosis, clubbing or  ecchymosis.  Neuro: Cranial nerves intact, DTR's -flat to absent thruout.  Normal muscle tone, no cerebellar symptoms. Sensation intact to touch, Vibratory and Monofilament to the toes bilaterally.  Pysch: Alert and oriented X 3, normal affect, Insight and Judgment appropriate.   Assessment and Plan  1. Annual Preventative Screening Examination  2. Essential hypertension  - Microalbumin / creatinine urine ratio - EKG 12-Lead - Urinalysis, Routine w reflex microscopic - CBC  with Differential/Platelet - BASIC METABOLIC PANEL WITH GFR - TSH  3. Hyperlipidemia  - EKG 12-Lead - Hepatic function panel - Lipid panel - TSH  4. Type 2 diabetes mellitus with stage 3 chronic kidney disease, without long-term current use of insulin (HCC)  - EKG 12-Lead - Hemoglobin A1c - Insulin, random  5. Vitamin D deficiency  - VITAMIN D 25 Hydroxy   6. Gastroesophageal reflux disease    7. Malignant neoplasm of upper-inner quadrant of left breast in female, estrogen receptor positive (HCC)  - letrozole (FEMARA) 2.5 MG  Take 1 tablet daily  8. Screening for rectal cancer  - POC Hemoccult Bld/Stl   9. Screening for ischemic heart disease  - EKG 12-Lead  10. Medication management   11. Diabetic neuropathy, painful (HCC)  - gabapentin  100 MG capsule; Take 1 to 3 capsules at bedtime for neuritis  - amitriptyline  25 MG tablet; TAKE 1 TO 2 TABLETS AT BEDTIME   12. Gastroesophageal reflux disease  - Urinalysis, Routine w reflex microscopic - CBC with Differential/Platelet - BASIC METABOLIC PANEL WITH GFR - Hepatic function panel - Magnesium - Lipid panel - VITAMIN D 25 Hydroxy  13. Idiopathic gout  - Uric acid       Continue prudent diet as discussed, weight control, BP monitoring, regular exercise, and medications. Discussed med's effects and SE's. Screening labs and tests as requested with regular follow-up as recommended. Over 40 minutes of exam, counseling, chart review and high complex critical decision making was performed.

## 2017-01-10 ENCOUNTER — Other Ambulatory Visit: Payer: Self-pay | Admitting: Internal Medicine

## 2017-01-10 ENCOUNTER — Encounter: Payer: Self-pay | Admitting: Internal Medicine

## 2017-01-10 DIAGNOSIS — E782 Mixed hyperlipidemia: Secondary | ICD-10-CM

## 2017-01-10 LAB — LIPID PANEL
Cholesterol: 277 mg/dL — ABNORMAL HIGH (ref <200)
HDL: 35 mg/dL — ABNORMAL LOW (ref >50)
LDL CALC: 165 mg/dL — AB (ref <100)
TRIGLYCERIDES: 384 mg/dL — AB (ref <150)
Total CHOL/HDL Ratio: 7.9 Ratio — ABNORMAL HIGH (ref <5.0)
VLDL: 77 mg/dL — AB (ref <30)

## 2017-01-10 LAB — URINALYSIS, MICROSCOPIC ONLY
Casts: NONE SEEN [LPF]
Crystals: NONE SEEN [HPF]
YEAST: NONE SEEN [HPF]

## 2017-01-10 LAB — URINALYSIS, ROUTINE W REFLEX MICROSCOPIC
Bilirubin Urine: NEGATIVE
Glucose, UA: NEGATIVE
Ketones, ur: NEGATIVE
NITRITE: NEGATIVE
PH: 6 (ref 5.0–8.0)
Protein, ur: NEGATIVE
SPECIFIC GRAVITY, URINE: 1.015 (ref 1.001–1.035)

## 2017-01-10 LAB — HEPATIC FUNCTION PANEL
ALK PHOS: 108 U/L (ref 33–130)
ALT: 62 U/L — ABNORMAL HIGH (ref 6–29)
AST: 41 U/L — AB (ref 10–35)
Albumin: 4.4 g/dL (ref 3.6–5.1)
BILIRUBIN INDIRECT: 0.4 mg/dL (ref 0.2–1.2)
Bilirubin, Direct: 0.1 mg/dL (ref <=0.2)
TOTAL PROTEIN: 7 g/dL (ref 6.1–8.1)
Total Bilirubin: 0.5 mg/dL (ref 0.2–1.2)

## 2017-01-10 LAB — BASIC METABOLIC PANEL WITH GFR
BUN: 27 mg/dL — ABNORMAL HIGH (ref 7–25)
CHLORIDE: 100 mmol/L (ref 98–110)
CO2: 30 mmol/L (ref 20–31)
Calcium: 10.2 mg/dL (ref 8.6–10.4)
Creat: 1.43 mg/dL — ABNORMAL HIGH (ref 0.60–0.93)
GFR, EST AFRICAN AMERICAN: 40 mL/min — AB (ref >=60)
GFR, Est Non African American: 35 mL/min — ABNORMAL LOW (ref >=60)
GLUCOSE: 248 mg/dL — AB (ref 65–99)
POTASSIUM: 3.8 mmol/L (ref 3.5–5.3)
Sodium: 139 mmol/L (ref 135–146)

## 2017-01-10 LAB — MICROALBUMIN / CREATININE URINE RATIO
Creatinine, Urine: 112 mg/dL (ref 20–320)
Microalb Creat Ratio: 4 mcg/mg creat (ref <30)
Microalb, Ur: 0.5 mg/dL

## 2017-01-10 LAB — VITAMIN D 25 HYDROXY (VIT D DEFICIENCY, FRACTURES): Vit D, 25-Hydroxy: 58 ng/mL (ref 30–100)

## 2017-01-10 LAB — URIC ACID: Uric Acid, Serum: 5.6 mg/dL (ref 2.5–7.0)

## 2017-01-10 LAB — HEMOGLOBIN A1C
Hgb A1c MFr Bld: 7.5 % — ABNORMAL HIGH (ref <5.7)
Mean Plasma Glucose: 169 mg/dL

## 2017-01-10 LAB — MAGNESIUM: Magnesium: 1.7 mg/dL (ref 1.5–2.5)

## 2017-01-10 MED ORDER — METFORMIN HCL ER 500 MG PO TB24: ORAL_TABLET | ORAL | 1 refills | Status: DC

## 2017-01-10 MED ORDER — EZETIMIBE 10 MG PO TABS: ORAL_TABLET | ORAL | 1 refills | Status: DC

## 2017-01-12 LAB — INSULIN, RANDOM: Insulin: 227.5 u[IU]/mL — ABNORMAL HIGH (ref 2.0–19.6)

## 2017-01-13 ENCOUNTER — Encounter: Payer: Self-pay | Admitting: Internal Medicine

## 2017-01-13 ENCOUNTER — Encounter (HOSPITAL_COMMUNITY): Payer: Self-pay

## 2017-01-26 ENCOUNTER — Other Ambulatory Visit: Payer: Self-pay | Admitting: Internal Medicine

## 2017-01-26 ENCOUNTER — Ambulatory Visit
Admission: RE | Admit: 2017-01-26 | Discharge: 2017-01-26 | Disposition: A | Discharge status: Home / Self Care | Payer: PPO | Source: Ambulatory Visit | Attending: Internal Medicine | Admitting: Internal Medicine

## 2017-01-26 DIAGNOSIS — Z13820 Encounter for screening for osteoporosis: Secondary | ICD-10-CM | POA: Diagnosis not present

## 2017-01-26 DIAGNOSIS — E2839 Other primary ovarian failure: Secondary | ICD-10-CM

## 2017-01-26 DIAGNOSIS — Z780 Asymptomatic menopausal state: Secondary | ICD-10-CM | POA: Diagnosis not present

## 2017-01-27 ENCOUNTER — Other Ambulatory Visit: Payer: Self-pay

## 2017-01-27 DIAGNOSIS — Z1212 Encounter for screening for malignant neoplasm of rectum: Secondary | ICD-10-CM

## 2017-01-27 LAB — POC HEMOCCULT BLD/STL (HOME/3-CARD/SCREEN)
Card #3 Fecal Occult Blood, POC: NEGATIVE
FECAL OCCULT BLD: NEGATIVE
FECAL OCCULT BLD: NEGATIVE

## 2017-01-30 ENCOUNTER — Other Ambulatory Visit: Payer: Self-pay | Admitting: Hematology and Oncology

## 2017-01-30 DIAGNOSIS — Z853 Personal history of malignant neoplasm of breast: Secondary | ICD-10-CM

## 2017-01-31 ENCOUNTER — Other Ambulatory Visit: Payer: Self-pay | Admitting: Internal Medicine

## 2017-01-31 DIAGNOSIS — E1140 Type 2 diabetes mellitus with diabetic neuropathy, unspecified: Secondary | ICD-10-CM

## 2017-02-16 ENCOUNTER — Other Ambulatory Visit: Payer: Self-pay | Admitting: Hematology and Oncology

## 2017-02-23 ENCOUNTER — Ambulatory Visit
Admission: RE | Admit: 2017-02-23 | Discharge: 2017-02-23 | Disposition: A | Discharge status: Home / Self Care | Payer: PPO | Source: Ambulatory Visit | Attending: Hematology and Oncology | Admitting: Hematology and Oncology

## 2017-02-23 DIAGNOSIS — R922 Inconclusive mammogram: Secondary | ICD-10-CM | POA: Diagnosis not present

## 2017-02-23 DIAGNOSIS — Z853 Personal history of malignant neoplasm of breast: Secondary | ICD-10-CM

## 2017-02-23 HISTORY — DX: Personal history of irradiation: Z92.3

## 2017-02-23 HISTORY — DX: Personal history of antineoplastic chemotherapy: Z92.21

## 2017-03-09 ENCOUNTER — Ambulatory Visit (HOSPITAL_BASED_OUTPATIENT_CLINIC_OR_DEPARTMENT_OTHER): Payer: PPO | Admitting: Hematology and Oncology

## 2017-03-09 ENCOUNTER — Encounter: Payer: Self-pay | Admitting: Hematology and Oncology

## 2017-03-09 DIAGNOSIS — Z79811 Long term (current) use of aromatase inhibitors: Secondary | ICD-10-CM

## 2017-03-09 DIAGNOSIS — C50212 Malignant neoplasm of upper-inner quadrant of left female breast: Secondary | ICD-10-CM | POA: Diagnosis not present

## 2017-03-09 DIAGNOSIS — Z170 Estrogen receptor positive status [ER+]: Secondary | ICD-10-CM

## 2017-03-09 DIAGNOSIS — G620 Drug-induced polyneuropathy: Secondary | ICD-10-CM | POA: Diagnosis not present

## 2017-03-09 NOTE — Progress Notes (Signed)
Patient Care Team: [name], MD as PCP - General Macarthur Critchley, OD as Referring Physician (Optometry) [name], MD as Consulting Physician (Dermatology) Latanya Maudlin, MD as Consulting Physician (Orthopedic Surgery) Inda Castle, MD as Consulting Physician (Gastroenterology) [name], MD as Consulting Physician (Hematology and Oncology)  DIAGNOSIS:  Encounter Diagnosis  Name Primary?  . Malignant neoplasm of upper-inner quadrant of left breast in female, estrogen receptor positive ([place])     SUMMARY OF ONCOLOGIC HISTORY:   Breast cancer of upper-inner quadrant of left female breast ([place])   02/26/2016 Initial Diagnosis    Left breast biopsy 11:00 position: invasive ductal carcinoma with DCIS, ER 90%, PR 10%, HER-2 negative, Ki-67 30%, grade 2, 2.2 cm palpable lesion T2 N0 stage II a clinical stage      03/18/2016 Surgery    Left lumpectomy: Invasive ductal carcinoma, grade 2, 6.3 cm, with high-grade DCIS, margins negative, 0/4 lymph nodes negative, ER 90%, via 10%, HER-2 negative ratio 0.97, Ki-67 30%, T3 N0 stage IIB      03/25/2016 Procedure    Genetic testing is negative for pathogenic mutations within any of the 20 Genes on the breast/ovarian cancer panel      04/04/2016 Oncotype testing    Oncotype DX recurrence score 37, 25% 10 year distant risk of recurrence      04/17/2016 - 07/31/2016 Chemotherapy    Adjuvant chemotherapy with dose dense Adriamycin and Cytoxan followed by Abraxane weekly 8 ( discontinued due to neuropathy)      09/01/2016 - 09/26/2016 Radiation Therapy    Adj XRT      12/02/2016 -  Anti-estrogen oral therapy    Letrozole 2.5 mg daily       CHIEF COMPLIANT: Follow-up on letrozole  INTERVAL HISTORY: Kayla Baker is a 81 year old with above-mentioned history of left breast cancer treated with surgery followed by adjuvant chemotherapy and radiation is currently on letrozole therapy. She is tolerating letrozole moderately well. She has  profound hot flashes multiple times throughout the day. This is been impacting her quality of life. She denies any arthralgias or myalgias.  REVIEW OF SYSTEMS:   Constitutional: Denies fevers, chills or abnormal weight loss Eyes: Denies blurriness of vision Ears, nose, mouth, throat, and face: Denies mucositis or sore throat Respiratory: Denies cough, dyspnea or wheezes Cardiovascular: Denies palpitation, chest discomfort Gastrointestinal:  Denies nausea, heartburn or change in bowel habits Skin: Denies abnormal skin rashes Lymphatics: Denies new lymphadenopathy or easy bruising Neurological:Denies numbness, tingling or new weaknesses Behavioral/Psych: Mood is stable, no new changes  Extremities: No lower extremity edema Breast:  denies any pain or lumps or nodules in either breasts All other systems were reviewed with the patient and are negative.  I have reviewed the past medical history, past surgical history, social history and family history with the patient and they are unchanged from previous note.  ALLERGIES:  is allergic to keflex [cephalexin]; meloxicam; prednisone; premarin [estrogens conjugated]; and trovan [alatrofloxacin].  MEDICATIONS:  Current Outpatient Prescriptions  Medication Sig Dispense Refill  . acetaminophen (TYLENOL) 325 MG tablet Take 325 mg by mouth every 6 (six) hours as needed for headache.    . allopurinol (ZYLOPRIM) 300 MG tablet Take 1 tablet (300 mg total) by mouth daily. 90 tablet 1  . amitriptyline (ELAVIL) 25 MG tablet TAKE 1 TO 2 TABLETS BY MOUTH AT BEDTIME FOR NEURITIS 180 tablet 1  . aspirin 81 MG tablet Take 81 mg by mouth daily.    . chlorhexidine (PERIDEX) 0.12 %  solution Use as directed 15 mLs in the mouth or throat 2 (two) times daily. SWISH AND SPIT    . Cholecalciferol (VITAMIN D3) 5000 units CAPS Take 5,000 Units by mouth daily.    Marland Kitchen ezetimibe (ZETIA) 10 MG tablet Take 1 tablet daily for Cholesterol 90 tablet 1  . furosemide (LASIX) 20 MG  tablet Take 2 tablets (40 mg total) by mouth daily. 30 tablet 2  . gabapentin (NEURONTIN) 100 MG capsule TAKE 1 TO 3 CAPSULES BY MOUTH AT BEDTIME FOR NEURITIS 270 capsule 1  . glucose blood (FREESTYLE LITE) test strip USE ONE STRIP TO CHECK GLUCOSE ONCE DAILY. DX-E11.22 100 each 3  . hydrochlorothiazide (HYDRODIURIL) 25 MG tablet     . Lancets (FREESTYLE) lancets Check blood sugar one time daily-E11.22 100 each 3  . letrozole (FEMARA) 2.5 MG tablet Take 1 tablet daily 90 tablet 1  . loratadine (CLARITIN) 10 MG tablet Take 10 mg by mouth daily as needed for allergies.    . Magnesium ([employer] MAGNESIUM) 250 MG TABS Take 500 mg by mouth 2 (two) times daily.    . metFORMIN (GLUCOPHAGE XR) 500 MG 24 hr tablet Take 4 tablets daily for blood sugar 360 tablet 1  . metoprolol succinate (TOPROL-XL) 25 MG 24 hr tablet TAKE ONE TABLET BY MOUTH DAILY 30 tablet 1  . OVER THE COUNTER MEDICATION Takes Ranitidine 150 mg daily PRN.    . vitamin C (ASCORBIC ACID) 500 MG tablet Take 500 mg by mouth daily. Takes 2 tablets daily     No current facility-administered medications for this visit.     PHYSICAL EXAMINATION: ECOG PERFORMANCE STATUS: 1 - Symptomatic but completely ambulatory  Vitals:   03/09/17 1111  BP: 139/68  Pulse: 88  Resp: 18  Temp: 97.8 F (36.6 C)   Filed Weights   03/09/17 1111  Weight: 149 lb 9.6 oz (67.9 kg)    GENERAL:alert, no distress and comfortable SKIN: skin color, texture, turgor are normal, no rashes or significant lesions EYES: normal, Conjunctiva are pink and non-injected, sclera clear OROPHARYNX:no exudate, no erythema and lips, buccal mucosa, and tongue normal  NECK: supple, thyroid normal size, non-tender, without nodularity LYMPH:  no palpable lymphadenopathy in the cervical, axillary or inguinal LUNGS: clear to auscultation and percussion with normal breathing effort HEART: regular rate & rhythm and no murmurs and no lower extremity edema ABDOMEN:abdomen soft,  non-tender and normal bowel sounds MUSCULOSKELETAL:no cyanosis of digits and no clubbing  NEURO: alert & oriented x 3 with fluent speech, no focal motor/sensory deficits EXTREMITIES: No lower extremity edema  LABORATORY DATA:  I have reviewed the data as listed   Chemistry      Component Value Date/Time   NA 139 01/09/2017 1117   NA 141 11/06/2016 1102   K 3.8 01/09/2017 1117   K 3.9 11/06/2016 1102   CL 100 01/09/2017 1117   CO2 30 01/09/2017 1117   CO2 27 11/06/2016 1102   BUN 27 (H) 01/09/2017 1117   BUN 18.0 11/06/2016 1102   CREATININE 1.43 (H) 01/09/2017 1117   CREATININE 1.3 (H) 11/06/2016 1102      Component Value Date/Time   CALCIUM 10.2 01/09/2017 1117   CALCIUM 10.2 11/06/2016 1102   ALKPHOS 108 01/09/2017 1117   ALKPHOS 159 (H) 11/06/2016 1102   AST 41 (H) 01/09/2017 1117   AST 52 (H) 11/06/2016 1102   ALT 62 (H) 01/09/2017 1117   ALT 58 (H) 11/06/2016 1102   BILITOT 0.5 01/09/2017 1117  BILITOT 0.53 11/06/2016 1102       Lab Results  Component Value Date   WBC 4.9 01/09/2017   HGB 13.6 01/09/2017   HCT 40.8 01/09/2017   MCV 91.7 01/09/2017   PLT 111 (L) 01/09/2017   NEUTROABS 2,597 01/09/2017    ASSESSMENT & PLAN:  Breast cancer of upper-inner quadrant of left female breast ([place]) Left lumpectomy 03/08/2016: Invasive ductal carcinoma, grade 2, 6.3 cm, with high-grade DCIS, margins negative, 0/4 lymph nodes negative, ER 90%, via 10%, HER-2 negative ratio 0.97, Ki-67 30%, T3 N0 stage IIB  Oncotype DX: Patient has a score of 37 which translates into 25% 10 year risk of distant recurrence with tamoxifen alone. Adjuvant chemotherapy with dose dense Adriamycin and Cytoxan 4 followed by Abraxane weekly 8 ( stopped due to neuropathy) 04/17/2016 to 07/31/2016 Adj XRT from 09/01/16 to 09/26/16  Chemo induced neuropathy: Being monitored Current treatment: Anti estrogen therapy with Letrozole 2.5 mg daily started January 2018 Letrozole toxicities: 1. Severe  hot flashes: I encouraged the patient to take the medication at bedtime or even take half a tablet in the morning and half tablet in the evening. If in spite of this her symptoms do not improve then we will switch her to anastrozole. Patient will call us if she wants to switch to a different medication.   Return to clinic in 6 months   I spent 25 minutes talking to the patient of which more than half was spent in counseling and coordination of care.  No orders of the defined types were placed in this encounter.  The patient has a good understanding of the overall plan. she agrees with it. she will call with any problems that may develop before the next visit here.   Rulon Eisenmenger, MD 03/09/17

## 2017-03-09 NOTE — Assessment & Plan Note (Signed)
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 Adjuvant chemotherapy with dose dense Adriamycin and Cytoxan 4 followed by Abraxane weekly 8 ( stopped due to neuropathy) 04/17/2016 to 07/31/2016 Adj XRT from 09/01/16 to 09/26/16  Chemo induced neuropathy: Being monitored Current treatment: Anti estrogen therapy with Letrozole 2.5 mg daily started January 2018 Letrozole toxicities:  Return to clinic in 6 months

## 2017-03-16 DIAGNOSIS — E1142 Type 2 diabetes mellitus with diabetic polyneuropathy: Secondary | ICD-10-CM | POA: Diagnosis not present

## 2017-03-16 DIAGNOSIS — Z803 Family history of malignant neoplasm of breast: Secondary | ICD-10-CM | POA: Diagnosis not present

## 2017-03-16 DIAGNOSIS — M109 Gout, unspecified: Secondary | ICD-10-CM | POA: Diagnosis not present

## 2017-03-16 DIAGNOSIS — C50212 Malignant neoplasm of upper-inner quadrant of left female breast: Secondary | ICD-10-CM | POA: Diagnosis not present

## 2017-03-16 DIAGNOSIS — E119 Type 2 diabetes mellitus without complications: Secondary | ICD-10-CM | POA: Diagnosis not present

## 2017-03-16 DIAGNOSIS — Z90710 Acquired absence of both cervix and uterus: Secondary | ICD-10-CM | POA: Diagnosis not present

## 2017-03-16 DIAGNOSIS — I10 Essential (primary) hypertension: Secondary | ICD-10-CM | POA: Diagnosis not present

## 2017-03-23 ENCOUNTER — Other Ambulatory Visit: Payer: Self-pay | Admitting: Internal Medicine

## 2017-04-01 DIAGNOSIS — N6489 Other specified disorders of breast: Secondary | ICD-10-CM | POA: Insufficient documentation

## 2017-04-01 DIAGNOSIS — Z853 Personal history of malignant neoplasm of breast: Secondary | ICD-10-CM | POA: Insufficient documentation

## 2017-04-01 DIAGNOSIS — Z923 Personal history of irradiation: Secondary | ICD-10-CM | POA: Insufficient documentation

## 2017-04-02 DIAGNOSIS — Z853 Personal history of malignant neoplasm of breast: Secondary | ICD-10-CM | POA: Diagnosis not present

## 2017-04-02 DIAGNOSIS — Z923 Personal history of irradiation: Secondary | ICD-10-CM | POA: Diagnosis not present

## 2017-04-02 DIAGNOSIS — N6489 Other specified disorders of breast: Secondary | ICD-10-CM | POA: Diagnosis not present

## 2017-04-17 ENCOUNTER — Other Ambulatory Visit: Payer: Self-pay | Admitting: Hematology and Oncology

## 2017-04-20 ENCOUNTER — Ambulatory Visit: Payer: Self-pay | Admitting: Internal Medicine

## 2017-04-20 ENCOUNTER — Other Ambulatory Visit: Payer: Self-pay | Admitting: *Deleted

## 2017-04-20 MED ORDER — METOPROLOL SUCCINATE ER 25 MG PO TB24: 25.0000 mg | ORAL_TABLET | Freq: Every day | ORAL | 3 refills | Status: DC

## 2017-05-03 NOTE — Progress Notes (Signed)
MEDICARE ANNUAL WELLNESS VISIT AND FOLLOW UP  Assessment:    Essential hypertension -cont meds -dash diet  -monitor at home - TSH  Type 2 diabetes mellitus with stage 3 chronic kidney disease, without long-term current use of insulin (HCC) -cont monitoring sugar twice weekly -diet and exercise - Hemoglobin A1c - ? Need to send to nephrologist  Diabetic neuropathy, painful ([place]) -see above - Hemoglobin A1c  Hyperlipidemia -cont meds - Lipid panel   Vitamin D deficiency -cont Vit D supplement   Medication management - CBC with Differential/Platelet - BASIC METABOLIC PANEL WITH GFR - Hepatic function panel  Breast cancer of upper-inner quadrant of left female breast ([place]) -followed by [name]  Chemotherapy-induced peripheral neuropathy ([place]) Continue gabaptein  Vitamin D deficiency Continue supplement  Medication management -     Magnesium  Idiopathic gout, unspecified chronicity, unspecified site Gout- recheck Uric acid as needed, Diet discussed, continue medications.  Medicare annual wellness visit, subsequent  Elevated liver function tests -     Hepatitis C antibody - may need [place]   Over 30 minutes of exam, counseling, chart review, and critical decision making was performed  Future Appointments Date Time Provider [place]  06/30/2017 1:00 PM Gardenia Phlegm, NP [facility] None  08/19/2017 10:30 AM [name], MD [facility] None  09/08/2017 11:00 AM [name], MD [facility] None  02/08/2018 10:00 AM [name], MD [facility] None     Subjective:   Kayla Baker is a 81 y.o. female who presents for 3 month follow up on hypertension, prediabetes, hyperlipidemia, vitamin D def.   Her blood pressure has been controlled at home, today their BP is BP: 120/88, recheck 130/78.  She does not workout but has been more active, was able to help paint her deck at the beach. She denies chest pain, shortness of breath, dizziness.    She is on femara and follows with oncology for a L breast cancer, lumpectomy in April 2017.Marland Kitchen   She is on cholesterol medication and denies myalgias. Her cholesterol is at goal. The cholesterol last visit was:   Lab Results  Component Value Date   CHOL 277 (H) 01/09/2017   HDL 35 (L) 01/09/2017   LDLCALC 165 (H) 01/09/2017   TRIG 384 (H) 01/09/2017   CHOLHDL 7.9 (H) 01/09/2017     She has been working on diet and exercise for Diabetes with diabetic polyneuropathy, she is on neurontin, she is on bASA, she is not on ACE/ARB due to kidney function, sugars have been below 115, and denies paresthesia of the feet, polydipsia, polyuria and visual disturbances. Last A1C was:  Lab Results  Component Value Date   HGBA1C 7.5 (H) 01/09/2017   Last GFR Lab Results  Component Value Date   GFRNONAA 35 (L) 01/09/2017   Patient is on Vitamin D supplement. Lab Results  Component Value Date   VD25OH 58 01/09/2017     BMI is Body mass index is 25.32 kg/m., she is working on diet and exercise. Wt Readings from Last 3 Encounters:  05/04/17 145 lb 3.2 oz (65.9 kg)  03/09/17 149 lb 9.6 oz (67.9 kg)  01/09/17 156 lb (70.8 kg)    Medication Review Current Outpatient Prescriptions on File Prior to Visit  Medication Sig Dispense Refill  . acetaminophen (TYLENOL) 325 MG tablet Take 325 mg by mouth every 6 (six) hours as needed for headache.    . allopurinol (ZYLOPRIM) 300 MG tablet TAKE 1 TABLET BY MOUTH EVERY DAY 90 tablet 0  .  amitriptyline (ELAVIL) 25 MG tablet TAKE 1 TO 2 TABLETS BY MOUTH AT BEDTIME FOR NEURITIS 180 tablet 1  . aspirin 81 MG tablet Take 81 mg by mouth daily.    . chlorhexidine (PERIDEX) 0.12 % solution Use as directed 15 mLs in the mouth or throat 2 (two) times daily. SWISH AND SPIT    . Cholecalciferol (VITAMIN D3) 5000 units CAPS Take 5,000 Units by mouth daily.    Marland Kitchen ezetimibe (ZETIA) 10 MG tablet Take 1 tablet daily for Cholesterol 90 tablet 1  . furosemide (LASIX) 20 MG  tablet Take 2 tablets (40 mg total) by mouth daily. 30 tablet 2  . gabapentin (NEURONTIN) 100 MG capsule TAKE 1 TO 3 CAPSULES BY MOUTH AT BEDTIME FOR NEURITIS 270 capsule 1  . glucose blood (FREESTYLE LITE) test strip USE ONE STRIP TO CHECK GLUCOSE ONCE DAILY. DX-E11.22 100 each 3  . hydrochlorothiazide (HYDRODIURIL) 25 MG tablet     . Lancets (FREESTYLE) lancets Check blood sugar one time daily-E11.22 100 each 3  . letrozole (FEMARA) 2.5 MG tablet Take 1 tablet daily 90 tablet 1  . loratadine (CLARITIN) 10 MG tablet Take 10 mg by mouth daily as needed for allergies.    . Magnesium ([employer] MAGNESIUM) 250 MG TABS Take 500 mg by mouth 2 (two) times daily.    . metFORMIN (GLUCOPHAGE XR) 500 MG 24 hr tablet Take 4 tablets daily for blood sugar 360 tablet 1  . metoprolol succinate (TOPROL-XL) 25 MG 24 hr tablet Take 1 tablet (25 mg total) by mouth daily. 30 tablet 3  . OVER THE COUNTER MEDICATION Takes Ranitidine 150 mg daily PRN.    . vitamin C (ASCORBIC ACID) 500 MG tablet Take 500 mg by mouth daily. Takes 2 tablets daily     No current facility-administered medications on file prior to visit.     Current Problems (verified) Patient Active Problem List   Diagnosis Date Noted  . Port catheter in place 09/18/2016  . Chemotherapy-induced peripheral neuropathy ([place]) 08/07/2016  . Family history of breast cancer in sister 03/28/2016  . Family history of pancreatic cancer 03/28/2016  . 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 Gout 12/08/2014  . Diabetic neuropathy, painful ([place]) 08/28/2014  . Medication management 08/27/2014  . Essential hypertension 11/10/2013  . Hyperlipidemia 11/10/2013  . T2_NIDDM w/CKD3 (GFR 45 ml/min) 11/10/2013  . Vitamin D deficiency 11/10/2013    Allergies Allergies  Allergen Reactions  . Keflex [Cephalexin] Swelling    Tongue and throat swells  . Meloxicam Swelling  . Prednisone Swelling    Can Not take High doses  .  Premarin [Estrogens Conjugated] Hives  . Trovan [Alatrofloxacin] Other (See Comments)    Unknown reaction      Objective:   Today's Vitals   05/04/17 1024  BP: 120/88  Pulse: 87  Resp: 16  Temp: 97.3 F (36.3 C)  SpO2: 97%  Weight: 145 lb 3.2 oz (65.9 kg)  Height: 5' 3.5" (1.613 m)   Body mass index is 25.32 kg/m.  General appearance: alert, no distress, WD/WN,  female HEENT: normocephalic, sclerae anicteric, TMs pearly, nares patent, no discharge or erythema, pharynx normal Oral cavity: MMM, no lesions Neck: supple, no lymphadenopathy, no thyromegaly, no masses Heart: RRR, normal S1, S2, no murmurs Lungs: CTA bilaterally, no wheezes, rhonchi, or rales Abdomen: +bs, soft, non tender, non distended, no masses, no hepatomegaly, no splenomegaly Musculoskeletal: nontender, no swelling, no obvious deformity Extremities: no edema, no  cyanosis, no clubbing Pulses: 2+ symmetric, upper and lower extremities, normal cap refill Neurological: alert, oriented x 3, CN2-12 intact, strength normal upper extremities and lower extremities, sensation normal throughout, DTRs 2+ throughout, no cerebellar signs, gait normal Psychiatric: normal affect, behavior normal, pleasant    Vicie Mutters, PA-C   05/04/2017

## 2017-05-04 ENCOUNTER — Encounter: Payer: Self-pay | Admitting: Physician Assistant

## 2017-05-04 ENCOUNTER — Ambulatory Visit (INDEPENDENT_AMBULATORY_CARE_PROVIDER_SITE_OTHER): Payer: PPO | Admitting: Physician Assistant

## 2017-05-04 VITALS — BP 120/88 | HR 87 | Temp 97.3°F | Resp 16 | Ht 63.5 in | Wt 145.2 lb

## 2017-05-04 DIAGNOSIS — E1122 Type 2 diabetes mellitus with diabetic chronic kidney disease: Secondary | ICD-10-CM

## 2017-05-04 DIAGNOSIS — R6889 Other general symptoms and signs: Secondary | ICD-10-CM

## 2017-05-04 DIAGNOSIS — Z803 Family history of malignant neoplasm of breast: Secondary | ICD-10-CM

## 2017-05-04 DIAGNOSIS — R7989 Other specified abnormal findings of blood chemistry: Secondary | ICD-10-CM | POA: Diagnosis not present

## 2017-05-04 DIAGNOSIS — T451X5A Adverse effect of antineoplastic and immunosuppressive drugs, initial encounter: Secondary | ICD-10-CM

## 2017-05-04 DIAGNOSIS — Z95828 Presence of other vascular implants and grafts: Secondary | ICD-10-CM | POA: Diagnosis not present

## 2017-05-04 DIAGNOSIS — M1000 Idiopathic gout, unspecified site: Secondary | ICD-10-CM

## 2017-05-04 DIAGNOSIS — Z800 Family history of malignant neoplasm of digestive organs: Secondary | ICD-10-CM

## 2017-05-04 DIAGNOSIS — R945 Abnormal results of liver function studies: Secondary | ICD-10-CM

## 2017-05-04 DIAGNOSIS — I10 Essential (primary) hypertension: Secondary | ICD-10-CM | POA: Diagnosis not present

## 2017-05-04 DIAGNOSIS — N183 Chronic kidney disease, stage 3 (moderate): Secondary | ICD-10-CM

## 2017-05-04 DIAGNOSIS — Z79899 Other long term (current) drug therapy: Secondary | ICD-10-CM

## 2017-05-04 DIAGNOSIS — E1140 Type 2 diabetes mellitus with diabetic neuropathy, unspecified: Secondary | ICD-10-CM

## 2017-05-04 DIAGNOSIS — G620 Drug-induced polyneuropathy: Secondary | ICD-10-CM | POA: Diagnosis not present

## 2017-05-04 DIAGNOSIS — E559 Vitamin D deficiency, unspecified: Secondary | ICD-10-CM | POA: Diagnosis not present

## 2017-05-04 DIAGNOSIS — Z0000 Encounter for general adult medical examination without abnormal findings: Secondary | ICD-10-CM

## 2017-05-04 DIAGNOSIS — E782 Mixed hyperlipidemia: Secondary | ICD-10-CM

## 2017-05-04 DIAGNOSIS — C50212 Malignant neoplasm of upper-inner quadrant of left female breast: Secondary | ICD-10-CM

## 2017-05-04 DIAGNOSIS — Z0001 Encounter for general adult medical examination with abnormal findings: Secondary | ICD-10-CM

## 2017-05-04 DIAGNOSIS — Z170 Estrogen receptor positive status [ER+]: Secondary | ICD-10-CM

## 2017-05-04 LAB — CBC WITH DIFFERENTIAL/PLATELET
BASOS ABS: 58 {cells}/uL (ref 0–200)
Basophils Relative: 1 %
EOS ABS: 174 {cells}/uL (ref 15–500)
Eosinophils Relative: 3 %
HEMATOCRIT: 41.6 % (ref 35.0–45.0)
HEMOGLOBIN: 13.7 g/dL (ref 11.7–15.5)
Lymphocytes Relative: 33 %
Lymphs Abs: 1914 cells/uL (ref 850–3900)
MCH: 30.5 pg (ref 27.0–33.0)
MCHC: 32.9 g/dL (ref 32.0–36.0)
MCV: 92.7 fL (ref 80.0–100.0)
MONO ABS: 464 {cells}/uL (ref 200–950)
MONOS PCT: 8 %
MPV: 9.8 fL (ref 7.5–12.5)
NEUTROS ABS: 3190 {cells}/uL (ref 1500–7800)
Neutrophils Relative %: 55 %
PLATELETS: 151 10*3/uL (ref 140–400)
RBC: 4.49 MIL/uL (ref 3.80–5.10)
RDW: 14.7 % (ref 11.0–15.0)
WBC: 5.8 10*3/uL (ref 3.8–10.8)

## 2017-05-04 NOTE — Patient Instructions (Signed)
Hypoglycemia  Hypoglycemia occurs when the level of sugar (glucose) in the blood is too low. Glucose is a type of sugar that provides the body's main source of energy. Certain hormones (insulin and glucagon) control the level of glucose in the blood. Insulin lowers blood glucose, and glucagon increases blood glucose. Hypoglycemia can result from having too much insulin in the bloodstream, or from not eating enough food that contains glucose.  Hypoglycemia can happen in people who do or do not have diabetes. It can develop quickly, and it can be a medical emergency.  What are the causes?  Hypoglycemia occurs most often in people who have diabetes. If you have diabetes, hypoglycemia may be caused by:   Diabetes medicine.   Not eating enough, or not eating often enough.   Increased physical activity.   Drinking alcohol, especially when you have not eaten recently.    If you do not have diabetes, hypoglycemia may be caused by:   A tumor in the pancreas. The pancreas is the organ that makes insulin.   Not eating enough, or not eating for long periods at a time (fasting).   Severe infection or illness that affects the liver, heart, or kidneys.   Certain medicines.    You may also have reactive hypoglycemia. This condition causes hypoglycemia within 4 hours of eating a meal. This may occur after having stomach surgery. Sometimes, the cause of reactive hypoglycemia is not known.  What increases the risk?  Hypoglycemia is more likely to develop in:   People who have diabetes and take medicines to lower blood glucose.   People who abuse alcohol.   People who have a severe illness.    What are the signs or symptoms?  Hypoglycemia may not cause any symptoms. If you have symptoms, they may include:   Hunger.   Anxiety.   Sweating and feeling clammy.   Confusion.   Dizziness or feeling light-headed.   Sleepiness.   Nausea.   Increased heart rate.   Headache.   Blurry  vision.   Seizure.   Nightmares.   Tingling or numbness around the mouth, lips, or tongue.   A change in speech.   Decreased ability to concentrate.   A change in coordination.   Restless sleep.   Tremors or shakes.   Fainting.   Irritability.    How is this diagnosed?  Hypoglycemia is diagnosed with a blood test to measure your blood glucose level. This blood test is done while you are having symptoms. Your health care provider may also do a physical exam and review your medical history.  If you do not have diabetes, other tests may be done to find the cause of your hypoglycemia.  How is this treated?  This condition can often be treated by immediately eating or drinking something that contains glucose, such as:   3-4 sugar tablets (glucose pills).   Glucose gel, 15-gram tube.   Fruit juice, 4 oz (120 mL).   Regular soda (not diet soda), 4 oz (120 mL).   Low-fat milk, 4 oz (120 mL).   Several pieces of hard candy.   Sugar or honey, 1 Tbsp.    Treating Hypoglycemia If You Have Diabetes    If you are alert and able to swallow safely, follow the 15:15 rule:   Take 15 grams of a rapid-acting carbohydrate. Rapid-acting options include:  ? 1 tube of glucose gel.  ? 3 glucose pills.  ? 6-8 pieces of hard candy.  ?   4 oz (120 mL) of fruit juice.  ? 4 oz (120 ml) of regular (not diet) soda.   Check your blood glucose 15 minutes after you take the carbohydrate.   If the repeat blood glucose level is still at or below 70 mg/dL (3.9 mmol/L), take 15 grams of a carbohydrate again.   If your blood glucose level does not increase above 70 mg/dL (3.9 mmol/L) after 3 tries, seek emergency medical care.   After your blood glucose level returns to normal, eat a meal or a snack within 1 hour.    Treating Severe Hypoglycemia  Severe hypoglycemia is when your blood glucose level is at or below 54 mg/dL (3 mmol/L). Severe hypoglycemia is an emergency. Do not wait to see if the symptoms will go away. Get medical help  right away. Call your local emergency services (911 in the [place] Do not drive yourself to the hospital.  If you have severe hypoglycemia and you cannot eat or drink, you may need an injection of glucagon. A family member or close friend should learn how to check your blood glucose and how to give you a glucagon injection. Ask your health care provider if you need to have an emergency glucagon injection kit available.  Severe hypoglycemia may need to be treated in a hospital. The treatment may include getting glucose through an IV tube. You may also need treatment for the cause of your hypoglycemia.  Follow these instructions at home:  General instructions   Avoid any diets that cause you to not eat enough food. Talk with your health care provider before you start any new diet.   Take over-the-counter and prescription medicines only as told by your health care provider.   Limit alcohol intake to no more than 1 drink per day for nonpregnant women and 2 drinks per day for men. One drink equals 12 oz of beer, 5 oz of wine, or 1 oz of hard liquor.   Keep all follow-up visits as told by your health care provider. This is important.  If You Have Diabetes:     Make sure you know the symptoms of hypoglycemia.   Always have a rapid-acting carbohydrate snack with you to treat low blood sugar.   Follow your diabetes management plan, as told by your health care provider. Make sure you:  ? Take your medicines as directed.  ? Follow your exercise plan.  ? Follow your meal plan. Eat on time, and do not skip meals.  ? Check your blood glucose as often as directed. Make sure to check your blood glucose before and after exercise. If you exercise longer or in a different way than usual, check your blood glucose more often.  ? Follow your sick day plan whenever you cannot eat or drink normally. Make this plan in advance with your health care provider.   Share your diabetes management plan with people in your workplace, school,  and household.   Check your urine for ketones when you are ill and as told by your health care provider.   Carry a medical alert card or wear medical alert jewelry.  If You Have Reactive Hypoglycemia or Low Blood Sugar From Other Causes:   Monitor your blood glucose as told by your health care provider.   Follow instructions from your health care provider about eating or drinking restrictions.  Contact a health care provider if:   You have problems keeping your blood glucose in your target range.   You have   provider. Make sure you discuss any questions you have with your health care provider. Document Released: 11/17/2005 Document Revised: 04/30/2016 Document Reviewed: 12/21/2015 Elsevier Interactive Patient Education  2018 [employer] Inc[employer]   Chronic Kidney Disease, Adult Chronic kidney disease (CKD) occurs when the kidneys are damaged during a period of 3 or more months. The kidneys are two organs that do many important jobs in the body, which include:  Removing wastes and extra fluids from the blood.  Making hormones that maintain the amount of fluid in your tissues and blood vessels.  Maintaining the right amount of fluids and chemicals in the body.  A small amount of kidney damage may not cause problems, but a large amount of damage may make it difficult or impossible for the kidneys to work the way they should. If steps  are not taken to slow down the kidney damage or stop it from getting worse, the kidneys may stop working permanently (end stage kidney disease). Most of the time, CKD does not go away, but it can often be controlled. People who have CKD can usually live normal lives. What are the causes? The most common causes of this condition are diabetes and high blood pressure (hypertension). Other causes include:  Heart and blood vessel (cardiovascular) disease.  Kidney diseases: ? Glomerulonephritis. ? Interstitial nephritis. ? Polycystic kidney disease. ? Renal vascular disease.  Diseases that affect the immune system.  Genetic diseases.  Medicines that damage the kidneys, such as anti-inflammatory medicines.  Poisoning.  Being around or in contact with poisonous (toxic) substances.  A kidney or urinary infection that occurs again (recurs).  Vasculitis.  Repeat kidney infections.  A problem with urine flow that may be caused by: ? Cancer. ? Having kidney stones more than one time. ? An enlarged prostate in males.  What increases the risk? This condition is more likely to develop in people who are:  Older than age 71.  Female.  Of African-American descent.  Current smokers or former smokers.  Obese.  You may also have an increased risk for CKD if you have a family history of CKDor youfrequently take medicines that are damaging to the kidneys. What are the signs or symptoms? Symptoms develop slowly and may not be obvious until the kidney damage becomes severe. It is possible to have a kidney disease for years without showing any symptoms. Symptoms of this condition can include:  Swelling (edema) of the face, legs, ankles, or feet.  Numbness, tingling, or loss of feeling (sensation) in the hands or feet.  Tiredness (lethargy).  Nausea or vomiting.  Confusion or trouble concentrating.  Problems with urination, such as: ? Painful or burning feeling during  urination. ? Decreased urine production. ? Frequent urination, especially at night. ? Bloody urine.  Muscle twitches and cramps, especially in the legs.  Shortness of breath.  Weakness.  Constant itchiness.  Loss of appetite.  Metallic taste in the mouth.  Trouble sleeping.  Pale lining of the eyelids and surface of the eye (conjunctiva).  How is this diagnosed? This condition may be diagnosed with various tests. Tests may include:  Blood tests.  Urine tests.  Imaging tests.  A test in which a sample of tissue is removed from the kidneys to be looked at under a microscope (kidney biopsy).  These test results will help your health care provider determine what class of CKD you have. How is this treated? Most cases of CKD cannot be cured. Treatment usually involves relieving symptoms and preventing or  slowing the progression of the disease. Treatment may include:  A special diet, which may require you to avoid alcohol, salty foods (sodium), and foods that are high in potassium, calcium, and protein.  Medicines: ? To lower blood pressure. ? To relieve low blood count (anemia). ? To relieve swelling. ? To protect your bones. ? To improve the balance of electrolytes in your blood.  Removing toxic waste from the body by using hemodialysis or peritoneal dialysis if the kidneys can no longer do their job (kidney failure).  Management of other conditions that are causing your CKD or making it worse.  Follow these instructions at home:  Follow your prescribed diet.  Take over-the-counter and prescription medicines only as told by your health care provider. ? Do not take any new medicines unless approved by your health care provider. Many medicines can worsen your kidney damage. ? Do not take any vitamin and mineral supplements unless approved by your health care provider. Many nutritional supplements can worsen your kidney damage. ? The dose of some medicines that you  take may need to be adjusted.  Do not use any tobacco products, such as cigarettes, chewing tobacco, and e-cigarettes. If you need help quitting, ask your health care provider.  Keep all follow-up visits as told by your health care provider. This is important.  Keep track of your blood pressure. Report changes in your blood pressure as told by your health care provider.  Achieve and maintain a healthy weight. If you need help with this, ask your health care provider.  Start or continue an exercise plan. Try to exercise at least 30 minutes a day, 5 days a week.  Stay current with immunizations as told by your health care provider. Where to find more information:  American Association of Kidney Patients: BombTimer.gl  National Kidney Foundation: www.kidney.[place]: https://mathis.com/  [facility]: www.lifeoptions.org and www.kidneyschool.org Contact a health care provider if:  Your symptoms get worse.  You develop new symptoms. Get help right away if:  You develop symptoms of end-stage kidney disease, which include: ? Headaches. ? Abnormally dark or light skin. ? Numbness in the hands or feet. ? Easy bruising. ? Frequent hiccups. ? Chest pain. ? Shortness of breath. ? End of menstruation in women.  You have a fever.  You have decreased urine production.  You have pain or bleeding when you urinate. This information is not intended to replace advice given to you by your health care provider. Make sure you discuss any questions you have with your health care provider. Document Released: 08/26/2008 Document Revised: 04/24/2016 Document Reviewed: 07/16/2012 Elsevier Interactive Patient Education  2017 [employer].

## 2017-05-05 ENCOUNTER — Other Ambulatory Visit: Payer: Self-pay | Admitting: Physician Assistant

## 2017-05-05 DIAGNOSIS — N183 Chronic kidney disease, stage 3 (moderate): Principal | ICD-10-CM

## 2017-05-05 DIAGNOSIS — E1122 Type 2 diabetes mellitus with diabetic chronic kidney disease: Secondary | ICD-10-CM

## 2017-05-05 LAB — LIPID PANEL
Cholesterol: 197 mg/dL (ref <200)
HDL: 49 mg/dL — ABNORMAL LOW (ref >50)
LDL CALC: 113 mg/dL — AB (ref <100)
Total CHOL/HDL Ratio: 4 Ratio (ref <5.0)
Triglycerides: 177 mg/dL — ABNORMAL HIGH (ref <150)
VLDL: 35 mg/dL — ABNORMAL HIGH (ref <30)

## 2017-05-05 LAB — BASIC METABOLIC PANEL WITH GFR
BUN: 25 mg/dL (ref 7–25)
CHLORIDE: 103 mmol/L (ref 98–110)
CO2: 27 mmol/L (ref 20–31)
CREATININE: 1.42 mg/dL — AB (ref 0.60–0.93)
Calcium: 10.8 mg/dL — ABNORMAL HIGH (ref 8.6–10.4)
GFR, Est African American: 41 mL/min — ABNORMAL LOW (ref >=60)
GFR, Est Non African American: 35 mL/min — ABNORMAL LOW (ref >=60)
GLUCOSE: 106 mg/dL — AB (ref 65–99)
POTASSIUM: 4 mmol/L (ref 3.5–5.3)
Sodium: 144 mmol/L (ref 135–146)

## 2017-05-05 LAB — HEPATIC FUNCTION PANEL
ALBUMIN: 4.5 g/dL (ref 3.6–5.1)
ALK PHOS: 103 U/L (ref 33–130)
ALT: 31 U/L — AB (ref 6–29)
AST: 31 U/L (ref 10–35)
Bilirubin, Direct: 0.1 mg/dL (ref <=0.2)
Indirect Bilirubin: 0.5 mg/dL (ref 0.2–1.2)
TOTAL PROTEIN: 7.3 g/dL (ref 6.1–8.1)
Total Bilirubin: 0.6 mg/dL (ref 0.2–1.2)

## 2017-05-05 LAB — HEMOGLOBIN A1C
HEMOGLOBIN A1C: 6.4 % — AB (ref <5.7)
MEAN PLASMA GLUCOSE: 137 mg/dL

## 2017-05-05 LAB — MAGNESIUM: Magnesium: 2 mg/dL (ref 1.5–2.5)

## 2017-05-05 LAB — HEPATITIS C ANTIBODY: HCV AB: NEGATIVE

## 2017-05-05 LAB — TSH: TSH: 2.4 m[IU]/L

## 2017-05-25 ENCOUNTER — Other Ambulatory Visit: Payer: Self-pay | Admitting: Internal Medicine

## 2017-05-28 ENCOUNTER — Encounter: Payer: PPO | Admitting: Adult Health

## 2017-06-04 ENCOUNTER — Encounter: Payer: PPO | Admitting: Adult Health

## 2017-06-09 ENCOUNTER — Encounter (HOSPITAL_BASED_OUTPATIENT_CLINIC_OR_DEPARTMENT_OTHER): Payer: Self-pay | Admitting: *Deleted

## 2017-06-10 DIAGNOSIS — Z170 Estrogen receptor positive status [ER+]: Secondary | ICD-10-CM | POA: Diagnosis not present

## 2017-06-10 DIAGNOSIS — I10 Essential (primary) hypertension: Secondary | ICD-10-CM | POA: Diagnosis not present

## 2017-06-10 DIAGNOSIS — N183 Chronic kidney disease, stage 3 (moderate): Secondary | ICD-10-CM | POA: Diagnosis not present

## 2017-06-10 DIAGNOSIS — E1122 Type 2 diabetes mellitus with diabetic chronic kidney disease: Secondary | ICD-10-CM | POA: Diagnosis not present

## 2017-06-10 DIAGNOSIS — C50212 Malignant neoplasm of upper-inner quadrant of left female breast: Secondary | ICD-10-CM | POA: Diagnosis not present

## 2017-06-12 ENCOUNTER — Encounter (HOSPITAL_BASED_OUTPATIENT_CLINIC_OR_DEPARTMENT_OTHER)
Admission: RE | Admit: 2017-06-12 | Discharge: 2017-06-12 | Disposition: A | Discharge status: Home / Self Care | Payer: PPO | Source: Ambulatory Visit | Attending: Plastic Surgery | Admitting: Plastic Surgery

## 2017-06-12 DIAGNOSIS — Z01812 Encounter for preprocedural laboratory examination: Secondary | ICD-10-CM | POA: Insufficient documentation

## 2017-06-12 DIAGNOSIS — Z923 Personal history of irradiation: Secondary | ICD-10-CM | POA: Diagnosis not present

## 2017-06-12 DIAGNOSIS — N6489 Other specified disorders of breast: Secondary | ICD-10-CM | POA: Diagnosis not present

## 2017-06-12 DIAGNOSIS — Z853 Personal history of malignant neoplasm of breast: Secondary | ICD-10-CM | POA: Diagnosis not present

## 2017-06-12 LAB — BASIC METABOLIC PANEL
ANION GAP: 8 (ref 5–15)
BUN: 18 mg/dL (ref 6–20)
CALCIUM: 10.7 mg/dL — AB (ref 8.9–10.3)
CHLORIDE: 102 mmol/L (ref 101–111)
CO2: 30 mmol/L (ref 22–32)
Creatinine, Ser: 1.15 mg/dL — ABNORMAL HIGH (ref 0.44–1.00)
GFR calc non Af Amer: 44 mL/min — ABNORMAL LOW (ref >60)
GFR, EST AFRICAN AMERICAN: 51 mL/min — AB (ref >60)
GLUCOSE: 100 mg/dL — AB (ref 65–99)
POTASSIUM: 4.5 mmol/L (ref 3.5–5.1)
Sodium: 140 mmol/L (ref 135–145)

## 2017-06-12 NOTE — Progress Notes (Signed)
Bottled water given with instructions to complete by [place], pt verbalized understanding.

## 2017-06-16 ENCOUNTER — Encounter (HOSPITAL_BASED_OUTPATIENT_CLINIC_OR_DEPARTMENT_OTHER): Payer: Self-pay | Admitting: Anesthesiology

## 2017-06-16 ENCOUNTER — Ambulatory Visit (HOSPITAL_BASED_OUTPATIENT_CLINIC_OR_DEPARTMENT_OTHER)
Admission: RE | Admit: 2017-06-16 | Discharge: 2017-06-16 | Disposition: A | Discharge status: Home / Self Care | Payer: PPO | Source: Ambulatory Visit | Attending: Plastic Surgery | Admitting: Plastic Surgery

## 2017-06-16 ENCOUNTER — Ambulatory Visit (HOSPITAL_BASED_OUTPATIENT_CLINIC_OR_DEPARTMENT_OTHER): Payer: PPO | Admitting: Anesthesiology

## 2017-06-16 ENCOUNTER — Other Ambulatory Visit: Payer: Self-pay | Admitting: Internal Medicine

## 2017-06-16 ENCOUNTER — Encounter (HOSPITAL_BASED_OUTPATIENT_CLINIC_OR_DEPARTMENT_OTHER)
Admission: RE | Disposition: A | Discharge status: Home / Self Care | Payer: Self-pay | Source: Ambulatory Visit | Attending: Plastic Surgery

## 2017-06-16 DIAGNOSIS — Z923 Personal history of irradiation: Secondary | ICD-10-CM | POA: Insufficient documentation

## 2017-06-16 DIAGNOSIS — E785 Hyperlipidemia, unspecified: Secondary | ICD-10-CM | POA: Diagnosis not present

## 2017-06-16 DIAGNOSIS — I10 Essential (primary) hypertension: Secondary | ICD-10-CM | POA: Diagnosis not present

## 2017-06-16 DIAGNOSIS — N6011 Diffuse cystic mastopathy of right breast: Secondary | ICD-10-CM | POA: Diagnosis not present

## 2017-06-16 DIAGNOSIS — Z7984 Long term (current) use of oral hypoglycemic drugs: Secondary | ICD-10-CM | POA: Insufficient documentation

## 2017-06-16 DIAGNOSIS — M109 Gout, unspecified: Secondary | ICD-10-CM | POA: Diagnosis not present

## 2017-06-16 DIAGNOSIS — Z9012 Acquired absence of left breast and nipple: Secondary | ICD-10-CM | POA: Diagnosis not present

## 2017-06-16 DIAGNOSIS — N651 Disproportion of reconstructed breast: Secondary | ICD-10-CM | POA: Diagnosis not present

## 2017-06-16 DIAGNOSIS — Z853 Personal history of malignant neoplasm of breast: Secondary | ICD-10-CM | POA: Insufficient documentation

## 2017-06-16 DIAGNOSIS — E119 Type 2 diabetes mellitus without complications: Secondary | ICD-10-CM | POA: Insufficient documentation

## 2017-06-16 DIAGNOSIS — K219 Gastro-esophageal reflux disease without esophagitis: Secondary | ICD-10-CM | POA: Diagnosis not present

## 2017-06-16 DIAGNOSIS — Z421 Encounter for breast reconstruction following mastectomy: Secondary | ICD-10-CM | POA: Diagnosis not present

## 2017-06-16 HISTORY — PX: MASTOPEXY: SHX5358

## 2017-06-16 LAB — POCT HEMOGLOBIN-HEMACUE: HEMOGLOBIN: 12.1 g/dL (ref 12.0–15.0)

## 2017-06-16 LAB — GLUCOSE, CAPILLARY
GLUCOSE-CAPILLARY: 121 mg/dL — AB (ref 65–99)
GLUCOSE-CAPILLARY: 107 mg/dL — AB (ref 65–99)

## 2017-06-16 SURGERY — MASTOPEXY
Anesthesia: General | Site: Breast | Laterality: Right

## 2017-06-16 MED ORDER — GABAPENTIN 300 MG PO CAPS
300.0000 mg | ORAL_CAPSULE | ORAL | Status: AC
  Administered 2017-06-16: 300 mg via ORAL

## 2017-06-16 MED ORDER — ONDANSETRON HCL 4 MG/2ML IJ SOLN
INTRAMUSCULAR | Status: AC
  Filled 2017-06-16: qty 2

## 2017-06-16 MED ORDER — ACETAMINOPHEN 500 MG PO TABS
ORAL_TABLET | ORAL | Status: AC
  Filled 2017-06-16: qty 2

## 2017-06-16 MED ORDER — ACETAMINOPHEN 500 MG PO TABS
1000.0000 mg | ORAL_TABLET | ORAL | Status: AC
  Administered 2017-06-16: 1000 mg via ORAL

## 2017-06-16 MED ORDER — CLINDAMYCIN PHOSPHATE 900 MG/50ML IV SOLN
900.0000 mg | INTRAVENOUS | Status: AC
  Administered 2017-06-16: 900 mg via INTRAVENOUS

## 2017-06-16 MED ORDER — DEXMEDETOMIDINE HCL IN NACL 200 MCG/50ML IV SOLN
INTRAVENOUS | Status: AC
  Filled 2017-06-16: qty 50

## 2017-06-16 MED ORDER — BUPIVACAINE LIPOSOME 1.3 % IJ SUSP
INTRAMUSCULAR | Status: DC | PRN
  Administered 2017-06-16: 20 mL

## 2017-06-16 MED ORDER — PROPOFOL 500 MG/50ML IV EMUL
INTRAVENOUS | Status: AC
  Filled 2017-06-16: qty 50

## 2017-06-16 MED ORDER — CHLORHEXIDINE GLUCONATE CLOTH 2 % EX PADS
6.0000 | MEDICATED_PAD | Freq: Once | CUTANEOUS | Status: DC
Start: 2017-06-16 — End: 2017-06-16

## 2017-06-16 MED ORDER — LACTATED RINGERS IV SOLN
INTRAVENOUS | Status: DC
  Administered 2017-06-16: 07:00:00 via INTRAVENOUS

## 2017-06-16 MED ORDER — LIDOCAINE HCL (CARDIAC) 20 MG/ML IV SOLN
INTRAVENOUS | Status: DC | PRN
  Administered 2017-06-16: 60 mg via INTRAVENOUS

## 2017-06-16 MED ORDER — DEXAMETHASONE SODIUM PHOSPHATE 10 MG/ML IJ SOLN
INTRAMUSCULAR | Status: AC
  Filled 2017-06-16: qty 1

## 2017-06-16 MED ORDER — DEXAMETHASONE SODIUM PHOSPHATE 4 MG/ML IJ SOLN
INTRAMUSCULAR | Status: DC | PRN
  Administered 2017-06-16: 10 mg via INTRAVENOUS

## 2017-06-16 MED ORDER — SUGAMMADEX SODIUM 200 MG/2ML IV SOLN
INTRAVENOUS | Status: AC
  Filled 2017-06-16: qty 2

## 2017-06-16 MED ORDER — LIDOCAINE HCL (CARDIAC) 20 MG/ML IV SOLN
INTRAVENOUS | Status: AC
  Filled 2017-06-16: qty 5

## 2017-06-16 MED ORDER — SUGAMMADEX SODIUM 200 MG/2ML IV SOLN
INTRAVENOUS | Status: DC | PRN
  Administered 2017-06-16: 200 mg via INTRAVENOUS

## 2017-06-16 MED ORDER — EPHEDRINE 5 MG/ML INJ
INTRAVENOUS | Status: AC
  Filled 2017-06-16: qty 10

## 2017-06-16 MED ORDER — MIDAZOLAM HCL 2 MG/2ML IJ SOLN: 1.0000 mg | INTRAMUSCULAR | Status: DC | PRN

## 2017-06-16 MED ORDER — HYDROMORPHONE HCL 1 MG/ML IJ SOLN: 0.2500 mg | INTRAMUSCULAR | Status: DC | PRN

## 2017-06-16 MED ORDER — ROCURONIUM BROMIDE 100 MG/10ML IV SOLN
INTRAVENOUS | Status: DC | PRN
  Administered 2017-06-16: 30 mg via INTRAVENOUS

## 2017-06-16 MED ORDER — HYDROCODONE-ACETAMINOPHEN 5-325 MG PO TABS: 1.0000 | ORAL_TABLET | Freq: Four times a day (QID) | ORAL | 0 refills | Status: DC | PRN

## 2017-06-16 MED ORDER — EPHEDRINE SULFATE 50 MG/ML IJ SOLN
INTRAMUSCULAR | Status: DC | PRN
  Administered 2017-06-16: 10 mg via INTRAVENOUS
  Administered 2017-06-16: 5 mg via INTRAVENOUS

## 2017-06-16 MED ORDER — GABAPENTIN 300 MG PO CAPS
ORAL_CAPSULE | ORAL | Status: AC
  Filled 2017-06-16: qty 1

## 2017-06-16 MED ORDER — CLINDAMYCIN PHOSPHATE 900 MG/50ML IV SOLN
INTRAVENOUS | Status: AC
Start: 2017-06-16 — End: 2017-06-16
  Filled 2017-06-16: qty 50

## 2017-06-16 MED ORDER — FENTANYL CITRATE (PF) 100 MCG/2ML IJ SOLN
INTRAMUSCULAR | Status: AC
  Filled 2017-06-16: qty 2

## 2017-06-16 MED ORDER — SCOPOLAMINE 1 MG/3DAYS TD PT72: 1.0000 | MEDICATED_PATCH | Freq: Once | TRANSDERMAL | Status: DC | PRN

## 2017-06-16 MED ORDER — FENTANYL CITRATE (PF) 100 MCG/2ML IJ SOLN
50.0000 ug | INTRAMUSCULAR | Status: DC | PRN
  Administered 2017-06-16: 100 ug via INTRAVENOUS

## 2017-06-16 MED ORDER — BUPIVACAINE LIPOSOME 1.3 % IJ SUSP
INTRAMUSCULAR | Status: AC
  Filled 2017-06-16: qty 20

## 2017-06-16 MED ORDER — PROPOFOL 10 MG/ML IV BOLUS
INTRAVENOUS | Status: DC | PRN
  Administered 2017-06-16: 100 mg via INTRAVENOUS

## 2017-06-16 MED ORDER — 0.9 % SODIUM CHLORIDE (POUR BTL) OPTIME
TOPICAL | Status: DC | PRN
  Administered 2017-06-16: 1000 mL

## 2017-06-16 SURGICAL SUPPLY — 35 items
BINDER BREAST LRG (GAUZE/BANDAGES/DRESSINGS) ×3 IMPLANT
BLADE SURG 10 STRL SS (BLADE) ×6 IMPLANT
BNDG GAUZE ELAST 4 BULKY (GAUZE/BANDAGES/DRESSINGS) ×6 IMPLANT
CANISTER SUCT 1200ML W/VALVE (MISCELLANEOUS) ×3 IMPLANT
CHLORAPREP W/TINT 26ML (MISCELLANEOUS) ×3 IMPLANT
COVER MAYO STAND STRL (DRAPES) ×3 IMPLANT
DRAPE TOP ARMCOVERS (MISCELLANEOUS) ×3 IMPLANT
DRAPE U-SHAPE 76X120 STRL (DRAPES) ×3 IMPLANT
DRAPE UTILITY XL STRL (DRAPES) ×3 IMPLANT
DRSG PAD ABDOMINAL 8X10 ST (GAUZE/BANDAGES/DRESSINGS) ×3 IMPLANT
ELECT COATED BLADE 2.86 ST (ELECTRODE) ×3 IMPLANT
ELECT REM PT RETURN 9FT ADLT (ELECTROSURGICAL) ×3
ELECTRODE REM PT RTRN 9FT ADLT (ELECTROSURGICAL) ×1 IMPLANT
GLOVE BIO SURGEON STRL SZ 6 (GLOVE) ×3 IMPLANT
GOWN STRL REUS W/ TWL LRG LVL3 (GOWN DISPOSABLE) ×2 IMPLANT
GOWN STRL REUS W/TWL LRG LVL3 (GOWN DISPOSABLE) ×4
LIQUID BAND (GAUZE/BANDAGES/DRESSINGS) ×3 IMPLANT
NEEDLE HYPO 25X1 1.5 SAFETY (NEEDLE) ×3 IMPLANT
NS IRRIG 1000ML POUR BTL (IV SOLUTION) ×3 IMPLANT
PACK BASIN DAY SURGERY FS (CUSTOM PROCEDURE TRAY) ×3 IMPLANT
SHEET MEDIUM DRAPE 40X70 STRL (DRAPES) ×3 IMPLANT
SLEEVE SCD COMPRESS KNEE MED (MISCELLANEOUS) ×3 IMPLANT
SPONGE LAP 18X18 X RAY DECT (DISPOSABLE) ×9 IMPLANT
STAPLER VISISTAT 35W (STAPLE) ×3 IMPLANT
SUT MNCRL AB 4-0 PS2 18 (SUTURE) ×6 IMPLANT
SUT VIC AB 3-0 PS1 18 (SUTURE) ×4
SUT VIC AB 3-0 PS1 18XBRD (SUTURE) ×2 IMPLANT
SUT VICRYL 4-0 PS2 18IN ABS (SUTURE) ×3 IMPLANT
SYR BULB IRRIGATION 50ML (SYRINGE) ×3 IMPLANT
SYR CONTROL 10ML LL (SYRINGE) IMPLANT
TAPE MEASURE VINYL STERILE (MISCELLANEOUS) ×3 IMPLANT
TOWEL OR 17X24 6PK STRL BLUE (TOWEL DISPOSABLE) ×6 IMPLANT
TUBE CONNECTING 20'X1/4 (TUBING) ×1
TUBE CONNECTING 20X1/4 (TUBING) ×2 IMPLANT
YANKAUER SUCT BULB TIP NO VENT (SUCTIONS) ×3 IMPLANT

## 2017-06-16 NOTE — H&P (Signed)
  Subjective:     Patient ID: Kayla Baker is a 81 y.o. female.  HPI  Referred by Dr. Dalbert Batman for consultation asymmetry breasts. 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 Completed adjuvant chemotherapy and radiation (10/17), now on letrozole.    Prior to lumpectomy C. Current, wearing same bra just not filling out, not using insert. Patient reports satisfied with left breast size.  Retired lives with husband. Has [place] at [facility], reports they go there frequently but she does not go in water more than a foot.  Last MMG 01/2017 benign     Objective:   Physical Exam CV: normal heart sounds regular rate PULM: clear to auscultation Breasts Grade 1 ptosis right, left pseudoptosis Left Upper pole with transverse scar, faded fine line, small depression in area Right volume> left SN to nipple R 24 L 21.5 cm BW R 16 L 14 cm Nipple to IMF R 7.5 L 8 cm    Assessment:     S/p partial mastectomy left, adjuvant XRT    Plan:     Reviewed any intervention on left would be higher risk due to XRT. She is satisfied with this breast. Plan right breast mastopexy. Discussed anchor or lollipop scars, OP surgery. Reviewed radiated breast will not develop as much ptosis compared with right with time/aging. Discussed risks nipple/areola necrosis, wound healing problems.  Irene Limbo, MD [facility] Plastic & Reconstructive Surgery [phone], pin [phone]

## 2017-06-16 NOTE — Anesthesia Postprocedure Evaluation (Signed)
Anesthesia Post Note  Patient: Kayla Baker  Procedure(s) Performed: Procedure(s) (LRB): RIGHT BREAST MASTOPEXY (Right)     Patient location during evaluation: PACU Anesthesia Type: General Level of consciousness: awake and alert Pain management: pain level controlled Vital Signs Assessment: post-procedure vital signs reviewed and stable Respiratory status: spontaneous breathing, nonlabored ventilation and respiratory function stable Cardiovascular status: blood pressure returned to baseline and stable Postop Assessment: no signs of nausea or vomiting Anesthetic complications: no    Last Vitals:  Vitals:   06/16/17 0931 06/16/17 0953  BP: (!) 141/75   Pulse: 74   Resp: 16   Temp:  (!) 36.4 C    Last Pain:  Vitals:   06/16/17 0931  TempSrc:   PainSc: 0-No pain                 Leisha Trinkle,W. EDMOND

## 2017-06-16 NOTE — Op Note (Signed)
Operative Note   DATE OF OPERATION: 7.17.18  LOCATION: [facility]-outpatient  SURGICAL DIVISION: Plastic Surgery  PREOPERATIVE DIAGNOSES:  1. History left breast cancer 2. Post operative asymmetry breast  POSTOPERATIVE DIAGNOSES:  same  PROCEDURE:  Right mastopexy  SURGEON: Irene Limbo MD MBA  ASSISTANT: none  ANESTHESIA:  General.   EBL: 25 ml  COMPLICATIONS: None immediate.   INDICATIONS FOR PROCEDURE:  The patient, Kayla Baker, is a 81 y.o. female born on Aug 26, 1936, is here for right mastopexy for symmetry following left lumpectomy and adjuvant radiation for cancer.   FINDINGS: 80.5 g resection right breast.  DESCRIPTION OF PROCEDURE:  The patient's operative site was marked with the patient in the preoperative area. The inframammary fold was transferred onto anterior surface right breast. With aide of Wise pattern marker, right mastopexy with 6 cm vertical limbs marked.The patient was taken to the operating room. SCDs were placed and IV antibiotics were given. The patient's operative site was prepped and draped in a sterile fashion. A time out was performed and all information was confirmed to be correct.  The right NAC was marked with 32 mm diameter cookie cutter. The remainder of area marked for mastopexy was deepithelialized. Incisions made in dermis and central pedicle maintained. Lateral flaps developed to allow redraping over breast. Patient tailor tacked closed. She was brought to upright sitting position and assessed for symmetry. Cavity irrigated with saline and hemostasis ensured. Closure completed with interrupted 3-0 vicryl in dermis for closure of vertical limb and inframammary fold. NAC inset with interrupted 4-0 vicryl. Skin closure completed with 4-0 monocryl subcuticular. Tissue adhesive applied to all incisions. Dry dressing and breast binder applied.   The patient was allowed to wake from anesthesia, extubated and taken to the recovery room in  satisfactory condition.   SPECIMENS: right mastopexy  DRAINS: none  Irene Limbo, MD [facility] [phone], pin [phone]

## 2017-06-16 NOTE — Anesthesia Procedure Notes (Signed)
Procedure Name: Intubation Performed by: Terrance Mass Pre-anesthesia Checklist: Patient identified, Emergency Drugs available, Suction available and Patient being monitored Patient Re-evaluated:Patient Re-evaluated prior to induction Oxygen Delivery Method: Circle system utilized Preoxygenation: Pre-oxygenation with 100% oxygen Induction Type: IV induction Ventilation: Mask ventilation without difficulty Laryngoscope Size: Miller and 2 Grade View: Grade I Tube type: Oral Tube size: 7.0 mm Number of attempts: 1 Airway Equipment and Method: Stylet Placement Confirmation: ETT inserted through vocal cords under direct vision,  positive ETCO2 and breath sounds checked- equal and bilateral Secured at: 22 cm Tube secured with: Tape Dental Injury: Teeth and Oropharynx as per pre-operative assessment

## 2017-06-16 NOTE — Anesthesia Preprocedure Evaluation (Addendum)
Anesthesia Evaluation  Patient identified by MRN, date of birth, ID band Patient awake    Reviewed: Allergy & Precautions, H&P , NPO status , Patient's Chart, lab work & pertinent test results, reviewed documented beta blocker date and time   Airway Mallampati: II  TM Distance: >3 FB Neck ROM: Full    Dental no notable dental hx. (+) Teeth Intact, Dental Advisory Given   Pulmonary neg pulmonary ROS,    Pulmonary exam normal breath sounds clear to auscultation       Cardiovascular hypertension, Pt. on medications and Pt. on home beta blockers  Rhythm:Regular Rate:Normal     Neuro/Psych negative neurological ROS  negative psych ROS   GI/Hepatic Neg liver ROS, GERD  Medicated and Controlled,  Endo/Other  diabetes, Type 2, Oral Hypoglycemic Agents  Renal/GU negative Renal ROS  negative genitourinary   Musculoskeletal   Abdominal   Peds  Hematology negative hematology ROS (+) anemia ,   Anesthesia Other Findings   Reproductive/Obstetrics negative OB ROS                            Anesthesia Physical Anesthesia Plan  ASA: II  Anesthesia Plan: General   Post-op Pain Management:    Induction: Intravenous  PONV Risk Score and Plan: 4 or greater and Ondansetron, Dexamethasone, Midazolam and Treatment may vary due to age or medical condition  Airway Management Planned: Oral ETT  Additional Equipment:   Intra-op Plan:   Post-operative Plan: Extubation in OR  Informed Consent: I have reviewed the patients History and Physical, chart, labs and discussed the procedure including the risks, benefits and alternatives for the proposed anesthesia with the patient or authorized representative who has indicated his/her understanding and acceptance.   Dental advisory given  Plan Discussed with: CRNA  Anesthesia Plan Comments:         Anesthesia Quick Evaluation

## 2017-06-16 NOTE — Transfer of Care (Signed)
Immediate Anesthesia Transfer of Care Note  Patient: Kayla Baker  Procedure(s) Performed: Procedure(s): RIGHT BREAST MASTOPEXY (Right)  Patient Location: PACU  Anesthesia Type:General  Level of Consciousness: awake and sedated  Airway & Oxygen Therapy: Patient Spontanous Breathing and Patient connected to face mask oxygen  Post-op Assessment: Report given to RN and Post -op Vital signs reviewed and stable  Post vital signs: Reviewed and stable  Last Vitals:  Vitals:   06/16/17 0630  BP: (!) 145/65  Pulse: 81  Resp: 18  Temp: 36.6 C    Last Pain:  Vitals:   06/16/17 0630  TempSrc: Oral      Patients Stated Pain Goal: 0 (03/54/65 6812)  Complications: No apparent anesthesia complications

## 2017-06-16 NOTE — Discharge Instructions (Signed)

## 2017-06-17 ENCOUNTER — Encounter (HOSPITAL_BASED_OUTPATIENT_CLINIC_OR_DEPARTMENT_OTHER): Payer: Self-pay | Admitting: Plastic Surgery

## 2017-06-30 ENCOUNTER — Encounter: Payer: PPO | Admitting: Adult Health

## 2017-06-30 ENCOUNTER — Telehealth: Payer: Self-pay | Admitting: Hematology and Oncology

## 2017-06-30 NOTE — Telephone Encounter (Signed)
lvm  on both phone numbers to inform pt of r/s SCP appts to 8/8 at 0930 due to [facility] being out of office

## 2017-07-01 ENCOUNTER — Telehealth: Payer: Self-pay | Admitting: Adult Health

## 2017-07-01 NOTE — Telephone Encounter (Signed)
Called patient to reschedule Survivorship Care Plan Visit. Talked with patient. Rescheduled plan visit for Monday 07/20/2017 at 1:00pm.       [facility] [place] 07/01/2017.

## 2017-07-02 DIAGNOSIS — D225 Melanocytic nevi of trunk: Secondary | ICD-10-CM | POA: Diagnosis not present

## 2017-07-02 DIAGNOSIS — Z85828 Personal history of other malignant neoplasm of skin: Secondary | ICD-10-CM | POA: Diagnosis not present

## 2017-07-02 DIAGNOSIS — L905 Scar conditions and fibrosis of skin: Secondary | ICD-10-CM | POA: Diagnosis not present

## 2017-07-02 DIAGNOSIS — D485 Neoplasm of uncertain behavior of skin: Secondary | ICD-10-CM | POA: Diagnosis not present

## 2017-07-02 DIAGNOSIS — D1801 Hemangioma of skin and subcutaneous tissue: Secondary | ICD-10-CM | POA: Diagnosis not present

## 2017-07-02 DIAGNOSIS — L821 Other seborrheic keratosis: Secondary | ICD-10-CM | POA: Diagnosis not present

## 2017-07-02 DIAGNOSIS — L814 Other melanin hyperpigmentation: Secondary | ICD-10-CM | POA: Diagnosis not present

## 2017-07-04 ENCOUNTER — Other Ambulatory Visit: Payer: Self-pay | Admitting: Internal Medicine

## 2017-07-04 DIAGNOSIS — E782 Mixed hyperlipidemia: Secondary | ICD-10-CM

## 2017-07-06 ENCOUNTER — Encounter: Payer: Self-pay | Admitting: Internal Medicine

## 2017-07-08 ENCOUNTER — Encounter: Payer: PPO | Admitting: Adult Health

## 2017-07-20 ENCOUNTER — Encounter: Payer: Self-pay | Admitting: Adult Health

## 2017-07-20 ENCOUNTER — Ambulatory Visit (HOSPITAL_BASED_OUTPATIENT_CLINIC_OR_DEPARTMENT_OTHER): Payer: PPO | Admitting: Adult Health

## 2017-07-20 VITALS — BP 136/62 | HR 93 | Temp 97.5°F | Resp 18 | Ht 63.0 in | Wt 144.1 lb

## 2017-07-20 DIAGNOSIS — Z79811 Long term (current) use of aromatase inhibitors: Secondary | ICD-10-CM

## 2017-07-20 DIAGNOSIS — C50212 Malignant neoplasm of upper-inner quadrant of left female breast: Secondary | ICD-10-CM

## 2017-07-20 DIAGNOSIS — G629 Polyneuropathy, unspecified: Secondary | ICD-10-CM | POA: Diagnosis not present

## 2017-07-20 DIAGNOSIS — G620 Drug-induced polyneuropathy: Secondary | ICD-10-CM

## 2017-07-20 DIAGNOSIS — Z170 Estrogen receptor positive status [ER+]: Secondary | ICD-10-CM | POA: Diagnosis not present

## 2017-07-20 DIAGNOSIS — T451X5A Adverse effect of antineoplastic and immunosuppressive drugs, initial encounter: Secondary | ICD-10-CM

## 2017-07-20 NOTE — Progress Notes (Signed)
CLINIC:  Survivorship   REASON FOR VISIT:  Routine follow-up post-treatment for a recent history of breast cancer.  BRIEF ONCOLOGIC HISTORY:    Breast cancer of upper-inner quadrant of left female breast ([place])   02/26/2016 Initial Diagnosis    Left breast biopsy 11:00 position: invasive ductal carcinoma with DCIS, ER 90%, PR 10%, HER-2 negative, Ki-67 30%, grade 2, 2.2 cm palpable lesion T2 N0 stage II a clinical stage      03/18/2016 Surgery    Left lumpectomy: Invasive ductal carcinoma, grade 2, 6.3 cm, with high-grade DCIS, margins negative, 0/4 lymph nodes negative, ER 90%, via 10%, HER-2 negative ratio 0.97, Ki-67 30%, T3 N0 stage IIB      03/25/2016 Procedure    Genetic testing is negative for pathogenic mutations within any of the 20 Genes on the breast/ovarian cancer panel      04/04/2016 Oncotype testing    Oncotype DX recurrence score 37, 25% 10 year distant risk of recurrence      04/17/2016 - 07/31/2016 Chemotherapy    Adjuvant chemotherapy with dose dense Adriamycin and Cytoxan followed by Abraxane weekly 8 ( discontinued due to neuropathy)      09/01/2016 - 09/26/2016 Radiation Therapy    Adj XRT 1) Left breast: 42.5 Gy in 17 fractions. 2) Left breast boost: 7.5 Gy in 3 fractions.      12/02/2016 -  Anti-estrogen oral therapy    Letrozole 2.5 mg daily       INTERVAL HISTORY:  Kayla Baker presents to the [facility] today for our initial meeting to review her survivorship care plan detailing her treatment course for breast cancer, as well as monitoring long-term side effects of that treatment, education regarding health maintenance, screening, and overall wellness and health promotion.     Overall, Kayla Baker reports feeling quite well.  She does have neuropathy in her feet.  No motor changes.  She takes Gabapentin at night, however only takes 257m.  She is not taking Amitriptyline due to a hung over feeling she experiences the morning after taking it.  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  They have reviewed Lyrica.     REVIEW OF SYSTEMS:  Review of Systems  Constitutional: Negative for appetite change, chills, fatigue, fever and unexpected weight change.  HENT:   Negative for hearing loss and lump/mass.   Eyes: Negative for eye problems and icterus.  Respiratory: Negative for chest tightness, cough and shortness of breath.   Cardiovascular: Negative for chest pain, leg swelling and palpitations.  Gastrointestinal: Negative for abdominal distention, abdominal pain, constipation, diarrhea, nausea and vomiting.  Endocrine: Negative for hot flashes.  Genitourinary: Negative for difficulty urinating and dyspareunia.   Skin: Negative for itching and rash.  Neurological: Negative for dizziness, extremity weakness, headaches and numbness.  Hematological: Negative for adenopathy. Does not bruise/bleed easily.  Psychiatric/Behavioral: Negative for depression. The patient is not nervous/anxious.    Breast: Denies any new nodularity, masses, tenderness, nipple changes, or nipple discharge.      ONCOLOGY TREATMENT TEAM:  1. [facility] 2. Medical Oncologist: Dr. GLindi Adie 3. Radiation Oncologist: Dr. MLisbeth Renshaw   PAST MEDICAL/SURGICAL HISTORY:  Past Medical History:  Diagnosis Date  . Anemia   . Breast cancer of upper-inner quadrant of left female breast ([place] 02/29/2016   skin- 2016- squamous- on right upper arm  . Diet-controlled type 2 diabetes mellitus ([facility]  diet and exercsise,Has never been on meds, type II   . GERD (gastroesophageal reflux disease)   . Gout    takes Allopurinol daily  . History of chemotherapy   . History of colon polyps    benign  . History of shingles   . Hx of radiation therapy   . Hypertension   . Personal history of chemotherapy    2017  . Personal history of radiation therapy    2017  . Vitamin  D deficiency    Past Surgical History:  Procedure Laterality Date  . ABDOMINAL HYSTERECTOMY  1973  . BREAST LUMPECTOMY Left 03/18/2016  . CARDIAC CATHETERIZATION    . CATARACT EXTRACTION, BILATERAL  2014   right eye 2/14; left eye 3/3  . COLONOSCOPY    . HAND SURGERY Left 2012  . INCONTINENCE SURGERY  2006  . KNEE ARTHROSCOPY Right   . MASTOPEXY Right 06/16/2017   Procedure: RIGHT BREAST MASTOPEXY;  Surgeon: Irene Limbo, MD;  Location: [facility];  Service: Plastics;  Laterality: Right;  . PORT-A-CATH REMOVAL N/A 11/27/2016   Procedure: REMOVAL PORT-A-CATH;  Surgeon: Fanny Skates, MD;  Location: [facility];  Service: General;  Laterality: N/A;  . PORTACATH PLACEMENT N/A 04/14/2016   Procedure: INSERTION PORT-A-CATH ;  Surgeon: Fanny Skates, MD;  Location: [place];  Service: General;  Laterality: N/A;  . RADIOACTIVE SEED GUIDED MASTECTOMY WITH AXILLARY SENTINEL LYMPH NODE BIOPSY Left 03/18/2016   Procedure: RADIOACTIVE SEED GUIDED LEFT PARTIAL MASTECTOMY WITH AXILLARY SENTINEL LYMPH NODE BIOPSY AND BLUE DYE INJECTION;  Surgeon: Fanny Skates, MD;  Location: [place];  Service: General;  Laterality: Left;     ALLERGIES:  Allergies  Allergen Reactions  . Keflex [Cephalexin] Swelling    Tongue and throat swells  . Meloxicam Swelling  . Prednisone Swelling    Can Not take High doses  . Premarin [Estrogens Conjugated] Hives  . Trovan [Alatrofloxacin] Other (See Comments)    Unknown reaction     CURRENT MEDICATIONS:  Outpatient Encounter Prescriptions as of 07/20/2017  Medication Sig Note  . acetaminophen (TYLENOL) 325 MG tablet Take 325 mg by mouth every 6 (six) hours as needed for headache.   . allopurinol (ZYLOPRIM) 300 MG tablet TAKE 1 TABLET BY MOUTH EVERY DAY   . amitriptyline (ELAVIL) 25 MG tablet TAKE 1 TO 2 TABLETS BY MOUTH AT BEDTIME FOR NEURITIS   . aspirin 81 MG tablet Take 81 mg by mouth daily.   . Cholecalciferol (VITAMIN D3) 5000 units CAPS Take 5,000  Units by mouth daily.   Marland Kitchen ezetimibe (ZETIA) 10 MG tablet TAKE 1 TABLET BY MOUTH DAILY FOR CHOLESTEROL   . gabapentin (NEURONTIN) 100 MG capsule TAKE 1 TO 3 CAPSULES BY MOUTH AT BEDTIME FOR NEURITIS   . glucose blood (FREESTYLE LITE) test strip USE ONE STRIP TO CHECK GLUCOSE ONCE DAILY. DX-E11.22   . hydrochlorothiazide (HYDRODIURIL) 25 MG tablet TAKE 1 TABLET BY MOUTH DAILY   . Lancets (FREESTYLE) lancets Check blood sugar one time daily-E11.22   . letrozole (FEMARA) 2.5 MG tablet Take 1 tablet daily   . loratadine (CLARITIN) 10 MG tablet Take 10 mg by mouth daily as needed for allergies.   . Magnesium ([employer] MAGNESIUM) 250 MG TABS Take 500 mg by mouth 2 (two) times daily.   . metoprolol succinate (TOPROL-XL) 25 MG 24 hr tablet TAKE 1 TABLET(25 MG) BY MOUTH DAILY   . OVER THE COUNTER MEDICATION Takes Ranitidine 150 mg daily PRN.   . vitamin C (  ASCORBIC ACID) 500 MG tablet Take 500 mg by mouth daily. Takes 2 tablets daily   . furosemide (LASIX) 20 MG tablet Take 2 tablets (40 mg total) by mouth daily. (Patient not taking: Reported on 07/20/2017) 01/09/2017: PRN  . metFORMIN (GLUCOPHAGE XR) 500 MG 24 hr tablet Take 4 tablets daily for blood sugar (Patient taking differently: 1,000 mg. Take 4 tablets daily for blood sugar)   . [DISCONTINUED] HYDROcodone-acetaminophen (NORCO) 5-325 MG tablet Take 1 tablet by mouth every 6 (six) hours as needed for moderate pain. (Patient not taking: Reported on 07/20/2017)    No facility-administered encounter medications on file as of 07/20/2017.      ONCOLOGIC FAMILY HISTORY:  Family History  Problem Relation Age of Onset  . Stroke Mother 41  . Other Mother        history of hysterectomy after last childbirth  . Diabetes Father   . Alzheimer's disease Father   . Hypertension Sister   . Lung cancer Sister 20       smoker  . Other Sister        history of hysterectomy for fibroids  . Breast cancer Sister 63  . Esophageal cancer Maternal Aunt 74       not a  smoker  . Heart attack Maternal Uncle 65  . Cirrhosis Sister   . Lung cancer Other        nephew dx. 70s; +smoker  . Epilepsy Other   . Other Other 12       great niece dx. benign ganglioglioma brain tumor; treated at [facility]  . Epilepsy Other        no seizures in 2 years  . Parkinson's disease Maternal Aunt   . Stroke Maternal Grandmother   . Heart Problems Maternal Grandmother   . Pancreatic cancer Cousin 64       paternal 1st cousin  . Colon cancer Neg Hx   . Stomach cancer Neg Hx     SOCIAL HISTORY:  Kayla Baker denies any current or history of tobacco, alcohol, or illicit drug use.     PHYSICAL EXAMINATION:  Vital Signs:   Vitals:   07/20/17 1315  BP: 136/62  Pulse: 93  Resp: 18  Temp: (!) 97.5 F (36.4 C)  SpO2: 98%   Filed Weights   07/20/17 1315  Weight: 144 lb 1.6 oz (65.4 kg)   General: Well-nourished, well-appearing female in no acute distress.  She is unaccompanied today.   HEENT: Head is normocephalic.  Pupils equal and reactive to light. Conjunctivae clear without exudate.  Sclerae anicteric. Oral mucosa is pink, moist.  Oropharynx is pink without lesions or erythema.  Lymph: No cervical, supraclavicular, or infraclavicular lymphadenopathy noted on palpation.  Cardiovascular: Regular rate and rhythm.Marland Kitchen Respiratory: Clear to auscultation bilaterally. Chest expansion symmetric; breathing non-labored.  GI: Abdomen soft and round; non-tender, non-distended. Bowel sounds normoactive.  GU: Deferred.  Neuro: No focal deficits. Steady gait.  Psych: Mood and affect normal and appropriate for situation.  Extremities: No edema. MSK: No focal spinal tenderness to palpation.  Full range of motion in bilateral upper extremities Skin: Warm and dry.  LABORATORY DATA:  None for this visit.  DIAGNOSTIC IMAGING:  None for this visit.      ASSESSMENT AND PLAN:  Ms.. Baker is a pleasant 81 y.o. female with Stage IIB left breast invasive ductal carcinoma,  ER+/PR+/HER2-, diagnosed in 01/2016, treated with lumpectomy, adjuvant radiation therapy, and anti-estrogen therapy with Letrozole beginning in 12/2016.  She  presents to the Survivorship Clinic for our initial meeting and routine follow-up post-completion of treatment for breast cancer.    1. Stage IIB left breast cancer:  Kayla Baker is continuing to recover from definitive treatment for breast cancer. She will follow-up with her medical oncologist, [name] in 08/2017 with history and physical exam per surveillance protocol.  She will continue her anti-estrogen therapy with Letrozole daily. Thus far, she is tolerating the Letrozole well, with minimal side effects. She was instructed to make [name] or myself aware if she begins to experience any worsening side effects of the medication and I could see her back in clinic to help manage those side effects, as needed. Today, a comprehensive survivorship care plan and treatment summary was reviewed with the patient today detailing her breast cancer diagnosis, treatment course, potential late/long-term effects of treatment, appropriate follow-up care with recommendations for the future, and patient education resources.  A copy of this summary, along with a letter will be sent to the patient's primary care provider via mail/fax/In Basket message after today's visit.    2. Peripheral Neuropathy: I reviewed this with her.  She is taking Gabapentin 240m at night.  I suggested she increase it to 3078m(allowable per her prescription from Dr. McMelford Aaseto see if that may help with her neuropathy.  I also reviewed Cymbalta with her and the possibility that it may help in the future for her peripheral neuropathy.    3. Bone health:  Given Kayla Baker's age/history of breast cancer and her current treatment regimen including anti-estrogen therapy with Letrozole, she is at risk for bone demineralization.  Her last DEXA scan was 01/2017 and demonstrated a normal bone density  with her lowest T score being in the right hip at -1.0.  She was encouraged to increase her consumption of foods rich in calcium, as well as increase her weight-bearing activities.  She was given education on specific activities to promote bone health.  4. Cancer screening:  Due to Kayla Baker history and her age, she should receive screening for skin cancers, colon cancer, and gynecologic cancers.  The information and recommendations are listed on the patient's comprehensive care plan/treatment summary and were reviewed in detail with the patient.    5. Health maintenance and wellness promotion: Kayla Baker encouraged to consume 5-7 servings of fruits and vegetables per day. We reviewed the "Nutrition Rainbow" handout, as well as the handout "Take Control of Your Health and Reduce Your Cancer Risk" from the [facility] She was also encouraged to engage in moderate to vigorous exercise for 30 minutes per day most days of the week. We discussed the [employer] fitness program, which is designed for cancer survivors to help them become more physically fit after cancer treatments.  She was instructed to limit her alcohol consumption and continue to abstain from tobacco use.     6. Support services/counseling: It is not uncommon for this period of the patient's cancer care trajectory to be one of many emotions and stressors.  We discussed an opportunity for her to participate in the next session of [facility]Finding Your New Normal") support group series designed for patients after they have completed treatment.   Kayla Baker encouraged to take advantage of our many other support services programs, support groups, and/or counseling in coping with her new life as a cancer survivor after completing anti-cancer treatment.  She was offered support today through active listening and expressive supportive counseling.  She was given information regarding our available services and encouraged to contact me  with any questions or for help enrolling in any of our support group/programs.    Dispo:   -Return to cancer center for follow up with [name] in 08/2017, and 05/2018  -Mammogram due in 01/2018 -DEXA due in 01/2019 -Follow up with Dr. Dalbert Batman in 10/2017 -She is welcome to return back to the Survivorship Clinic at any time; no additional follow-up needed at this time.  -Consider referral back to survivorship as a long-term survivor for continued surveillance  A total of (45) minutes of face-to-face time was spent with this patient with greater than 50% of that time in counseling and care-coordination.   Gardenia Phlegm, [place] [phone]   Note: PRIMARY CARE PROVIDER [name], [place] [phone]

## 2017-07-21 ENCOUNTER — Ambulatory Visit: Payer: Self-pay | Admitting: Internal Medicine

## 2017-08-19 ENCOUNTER — Encounter: Payer: Self-pay | Admitting: Internal Medicine

## 2017-08-19 ENCOUNTER — Other Ambulatory Visit: Payer: Self-pay | Admitting: *Deleted

## 2017-08-19 ENCOUNTER — Ambulatory Visit (INDEPENDENT_AMBULATORY_CARE_PROVIDER_SITE_OTHER): Payer: PPO | Admitting: Internal Medicine

## 2017-08-19 VITALS — BP 112/70 | HR 76 | Temp 97.3°F | Resp 18 | Ht 63.0 in | Wt 142.4 lb

## 2017-08-19 DIAGNOSIS — Z79899 Other long term (current) drug therapy: Secondary | ICD-10-CM

## 2017-08-19 DIAGNOSIS — N183 Chronic kidney disease, stage 3 unspecified: Secondary | ICD-10-CM

## 2017-08-19 DIAGNOSIS — Z23 Encounter for immunization: Secondary | ICD-10-CM

## 2017-08-19 DIAGNOSIS — I10 Essential (primary) hypertension: Secondary | ICD-10-CM

## 2017-08-19 DIAGNOSIS — E782 Mixed hyperlipidemia: Secondary | ICD-10-CM

## 2017-08-19 DIAGNOSIS — M1000 Idiopathic gout, unspecified site: Secondary | ICD-10-CM

## 2017-08-19 DIAGNOSIS — E1122 Type 2 diabetes mellitus with diabetic chronic kidney disease: Secondary | ICD-10-CM | POA: Diagnosis not present

## 2017-08-19 DIAGNOSIS — E559 Vitamin D deficiency, unspecified: Secondary | ICD-10-CM | POA: Diagnosis not present

## 2017-08-19 MED ORDER — FUROSEMIDE 20 MG PO TABS: 40.0000 mg | ORAL_TABLET | Freq: Every day | ORAL | 2 refills | Status: DC

## 2017-08-19 MED ORDER — DULOXETINE HCL 60 MG PO CPEP: ORAL_CAPSULE | ORAL | 1 refills | Status: DC

## 2017-08-19 NOTE — Patient Instructions (Signed)

## 2017-08-19 NOTE — Progress Notes (Signed)
This very nice 81 y.o. MWF presents for 6 month follow up with Hypertension, Hyperlipidemia, T2_NIDDM and Vitamin D Deficiency. Patient also has GERD and Gout controlled on respective meds.      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 Most recently she had a Rt Mastopexy for symmetry by Dr Iran Planas.     Patient is treated for HTN (2000) & BP has been controlled at home. Today's BP is at goal - 112/70. Patient has had no complaints of any cardiac type chest pain, palpitations, dyspnea/orthopnea/PND, dizziness, claudication, or dependent edema.     Hyperlipidemia is controlled with diet & meds. Patient denies myalgias or other med SE's. Last Lipids were not at goal: Lab Results  Component Value Date   CHOL 197 05/04/2017   HDL 49 (L) 05/04/2017   LDLCALC 113 (H) 05/04/2017   TRIG 177 (H) 05/04/2017   CHOLHDL 4.0 05/04/2017      Also, the patient has history of T2_NIDDM (2010) and has CKD2 and peripheral sensory neuropathy. Shrehas had no symptoms of reactive hypoglycemia, diabetic polys or visual blurring.  She reports FBG's range 90-110 mg%.  Despite Metformin & maintaining her weight,  Her last A1c was not at goal: Lab Results  Component Value Date   HGBA1C 6.4 (H) 05/04/2017      Further, the patient also has history of Vitamin D Deficiency ("33" / 2008) and supplements vitamin D without any suspected side-effects. Last vitamin D was near goal (70-100): Lab Results  Component Value Date   VD25OH 58 01/09/2017   Current Outpatient Prescriptions on File Prior to Visit  Medication Sig  . acetaminophen (TYLENOL) 325 MG tablet Take 325 mg by mouth every 6 (six) hours as needed for headache.  . allopurinol (ZYLOPRIM) 300 MG tablet TAKE 1 TABLET BY MOUTH EVERY DAY  . amitriptyline (ELAVIL) 25 MG tablet  TAKE 1 TO 2 TABLETS BY MOUTH AT BEDTIME FOR NEURITIS  . aspirin 81 MG tablet Take 81 mg by mouth daily.  . Cholecalciferol (VITAMIN D3) 5000 units CAPS Take 5,000 Units by mouth daily.  Marland Kitchen ezetimibe (ZETIA) 10 MG tablet TAKE 1 TABLET BY MOUTH DAILY FOR CHOLESTEROL  . gabapentin (NEURONTIN) 100 MG capsule TAKE 1 TO 3 CAPSULES BY MOUTH AT BEDTIME FOR NEURITIS  . glucose blood (FREESTYLE LITE) test strip USE ONE STRIP TO CHECK GLUCOSE ONCE DAILY. DX-E11.22  . hydrochlorothiazide (HYDRODIURIL) 25 MG tablet TAKE 1 TABLET BY MOUTH DAILY  . Lancets (FREESTYLE) lancets Check blood sugar one time daily-E11.22  . letrozole (FEMARA) 2.5 MG tablet Take 1 tablet daily  . loratadine (CLARITIN) 10 MG tablet Take 10 mg by mouth daily as needed for allergies.  . Magnesium ([employer] MAGNESIUM) 250 MG TABS Take 500 mg by mouth 2 (two) times daily.  . metoprolol succinate (TOPROL-XL) 25 MG 24 hr tablet TAKE 1 TABLET(25 MG) BY MOUTH DAILY  . OVER THE COUNTER MEDICATION Takes Ranitidine 150 mg daily PRN.  . vitamin C (ASCORBIC ACID) 500 MG tablet Take 500 mg by mouth daily. Takes 2 tablets daily  . metFORMIN (GLUCOPHAGE XR) 500 MG 24 hr tablet Take 4 tablets daily for blood sugar (Patient taking differently: 1,000 mg. Take 4 tablets daily for blood sugar)   No current facility-administered medications on file prior to visit.  Allergies  Allergen Reactions  . Keflex [Cephalexin] Swelling    Tongue and throat swells  . Meloxicam Swelling  . Prednisone Swelling    Can Not take High doses  . Premarin [Estrogens Conjugated] Hives  . Trovan [Alatrofloxacin] Other (See Comments)    Unknown reaction   PMHx:   Past Medical History:  Diagnosis Date  . Anemia   . Breast cancer of upper-inner quadrant of left female breast ([place]) 02/29/2016   skin- 2016- squamous- on right upper arm  . Diet-controlled type 2 diabetes mellitus ([place])    diet and exercsise,Has never been on meds, type II   . GERD (gastroesophageal reflux  disease)   . Gout    takes Allopurinol daily  . History of chemotherapy   . History of colon polyps    benign  . History of shingles   . Hx of radiation therapy   . Hypertension   . Personal history of chemotherapy    2017  . Personal history of radiation therapy    2017  . Vitamin D deficiency    Immunization History  Administered Date(s) Administered  . DTaP 07/01/2004  . Influenza Whole 08/15/2013  . Influenza, High Dose Seasonal PF 08/28/2014, 09/20/2015, 08/19/2017  . Pneumococcal Conjugate-13 09/20/2015  . Pneumococcal Polysaccharide-23 11/01/2009  . Td 08/10/2009  . Zoster 08/02/2011   Past Surgical History:  Procedure Laterality Date  . ABDOMINAL HYSTERECTOMY  1973  . BREAST LUMPECTOMY Left 03/18/2016  . CARDIAC CATHETERIZATION    . CATARACT EXTRACTION, BILATERAL  2014   right eye 2/14; left eye 3/3  . COLONOSCOPY    . HAND SURGERY Left 2012  . INCONTINENCE SURGERY  2006  . KNEE ARTHROSCOPY Right   . MASTOPEXY Right 06/16/2017   Procedure: RIGHT BREAST MASTOPEXY;  Surgeon: Irene Limbo, MD;  Location: [facility];  Service: Plastics;  Laterality: Right;  . PORT-A-CATH REMOVAL N/A 11/27/2016   Procedure: REMOVAL PORT-A-CATH;  Surgeon: Fanny Skates, MD;  Location: [facility];  Service: General;  Laterality: N/A;  . PORTACATH PLACEMENT N/A 04/14/2016   Procedure: INSERTION PORT-A-CATH ;  Surgeon: Fanny Skates, MD;  Location: [facility];  Service: General;  Laterality: N/A;  . RADIOACTIVE SEED GUIDED MASTECTOMY WITH AXILLARY SENTINEL LYMPH NODE BIOPSY Left 03/18/2016   Procedure: RADIOACTIVE SEED GUIDED LEFT PARTIAL MASTECTOMY WITH AXILLARY SENTINEL LYMPH NODE BIOPSY AND BLUE DYE INJECTION;  Surgeon: Fanny Skates, MD;  Location: [facility];  Service: General;  Laterality: Left;   FHx:    Reviewed / unchanged  SHx:    Reviewed / unchanged  Systems Review:  Constitutional: Denies fever, chills, wt changes, headaches, insomnia, fatigue, night sweats, change  in appetite. Eyes: Denies redness, blurred vision, diplopia, discharge, itchy, watery eyes.  ENT: Denies discharge, congestion, post nasal drip, epistaxis, sore throat, earache, hearing loss, dental pain, tinnitus, vertigo, sinus pain, snoring.  CV: Denies chest pain, palpitations, irregular heartbeat, syncope, dyspnea, diaphoresis, orthopnea, PND, claudication or edema. Respiratory: denies cough, dyspnea, DOE, pleurisy, hoarseness, laryngitis, wheezing.  Gastrointestinal: Denies dysphagia, odynophagia, heartburn, reflux, water brash, abdominal pain or cramps, nausea, vomiting, bloating, diarrhea, constipation, hematemesis, melena, hematochezia  or hemorrhoids. Genitourinary: Denies dysuria, frequency, urgency, nocturia, hesitancy, discharge, hematuria or flank pain. Musculoskeletal: Denies arthralgias, myalgias, stiffness, jt. swelling, pain, limping or strain/sprain.  Skin: Denies pruritus, rash, hives, warts, acne, eczema or change in skin lesion(s). Neuro: No weakness, tremor, incoordination, spasms, paresthesia or pain. Psychiatric: Denies confusion, memory loss or sensory loss. Endo: Denies change in weight, skin  or hair change.  Heme/Lymph: No excessive bleeding, bruising or enlarged lymph nodes.  Physical Exam  BP 112/70   Pulse 76   Temp (!) 97.3 F (36.3 C)   Resp 18   Ht 5' 3"  (1.6 m)   Wt 142 lb 6.4 oz (64.6 kg)   BMI 25.23 kg/m   Appears well nourished, well groomed  and in no distress.  Eyes: PERRLA, EOMs, conjunctiva no swelling or erythema. Sinuses: No frontal/maxillary tenderness ENT/Mouth: EAC's clear, TM's nl w/o erythema, bulging. Nares clear w/o erythema, swelling, exudates. Oropharynx clear without erythema or exudates. Oral hygiene is good. Tongue normal, non obstructing. Hearing intact.  Neck: Supple. Thyroid nl. Car 2+/2+ without bruits, nodes or JVD. Chest: Respirations nl with BS clear & equal w/o rales, rhonchi, wheezing or stridor.  Cor: Heart sounds  normal w/ regular rate and rhythm without sig. murmurs, gallops, clicks or rubs. Peripheral pulses normal and equal  without edema.  Abdomen: Soft & bowel sounds normal. Non-tender w/o guarding, rebound, hernias, masses or organomegaly.  Lymphatics: Unremarkable.  Musculoskeletal: Full ROM all peripheral extremities, joint stability, 5/5 strength and normal gait.  Skin: Warm, dry without exposed rashes, lesions or ecchymosis apparent.  Neuro: Cranial nerves intact, reflexes equal bilaterally. Sensory-motor testing grossly intact. Tendon reflexes grossly intact.  Pysch: Alert & oriented x 3.  Insight and judgement nl & appropriate. No ideations.  Assessment and Plan:  1. Essential hypertension  - Continue medication, monitor blood pressure at home.  - Continue DASH diet. Reminder to go to the ER if any CP,  SOB, nausea, dizziness, severe HA, changes vision/speech.  - CBC with Differential/Platelet - BASIC METABOLIC PANEL WITH GFR - Magnesium - TSH  2. Hyperlipidemia, mixed  - Continue diet/meds, exercise,& lifestyle modifications.  - Continue monitor periodic cholesterol/liver & renal functions   - Hepatic function panel - Lipid panel - TSH  3. Type 2 diabetes mellitus with stage 3 chronic kidney disease, without long-term current use of insulin (HCC)  - Continue diet, exercise, lifestyle modifications.  - Monitor appropriate labs.  - Hemoglobin A1c - Insulin, fasting  4. Vitamin D deficiency  - Continue supplementation.  - VITAMIN D 25 Hydroxy   5. Idiopathic gout  - Uric acid  6. Medication management  - CBC with Differential/Platelet - BASIC METABOLIC PANEL WITH GFR - Hepatic function panel - Magnesium - Lipid panel - TSH - Hemoglobin A1c - Insulin, fasting - VITAMIN D 25 Hydroxy - Uric acid  7. Need for immunization against influenza  - Flu vaccine HIGH DOSE PF (Fluzone High dose)          Discussed  regular exercise, BP monitoring, weight control  to achieve/maintain BMI less than 25 and discussed med and SE's. Recommended labs to assess and monitor clinical status with further disposition pending results of labs. Over 30 minutes of exam, counseling, chart review was performed.

## 2017-08-20 LAB — BASIC METABOLIC PANEL WITH GFR
BUN / CREAT RATIO: 20 (calc) (ref 6–22)
BUN: 27 mg/dL — ABNORMAL HIGH (ref 7–25)
CALCIUM: 10.6 mg/dL — AB (ref 8.6–10.4)
CO2: 29 mmol/L (ref 20–32)
Chloride: 104 mmol/L (ref 98–110)
Creat: 1.35 mg/dL — ABNORMAL HIGH (ref 0.60–0.93)
GFR, EST AFRICAN AMERICAN: 43 mL/min/{1.73_m2} — AB (ref >=60)
GFR, Est Non African American: 37 mL/min/{1.73_m2} — ABNORMAL LOW (ref >=60)
GLUCOSE: 103 mg/dL — AB (ref 65–99)
POTASSIUM: 4 mmol/L (ref 3.5–5.3)
Sodium: 143 mmol/L (ref 135–146)

## 2017-08-20 LAB — CBC WITH DIFFERENTIAL/PLATELET
BASOS PCT: 0.5 %
Basophils Absolute: 33 cells/uL (ref 0–200)
Eosinophils Absolute: 182 cells/uL (ref 15–500)
Eosinophils Relative: 2.8 %
HCT: 38.4 % (ref 35.0–45.0)
Hemoglobin: 12.8 g/dL (ref 11.7–15.5)
Lymphs Abs: 2243 cells/uL (ref 850–3900)
MCH: 30.5 pg (ref 27.0–33.0)
MCHC: 33.3 g/dL (ref 32.0–36.0)
MCV: 91.4 fL (ref 80.0–100.0)
MONOS PCT: 8.2 %
MPV: 10.7 fL (ref 7.5–12.5)
NEUTROS PCT: 54 %
Neutro Abs: 3510 cells/uL (ref 1500–7800)
PLATELETS: 136 10*3/uL — AB (ref 140–400)
RBC: 4.2 10*6/uL (ref 3.80–5.10)
RDW: 13.6 % (ref 11.0–15.0)
TOTAL LYMPHOCYTE: 34.5 %
WBC mixed population: 533 cells/uL (ref 200–950)
WBC: 6.5 10*3/uL (ref 3.8–10.8)

## 2017-08-20 LAB — VITAMIN D 25 HYDROXY (VIT D DEFICIENCY, FRACTURES): VIT D 25 HYDROXY: 114 ng/mL — AB (ref 30–100)

## 2017-08-20 LAB — LIPID PANEL
CHOLESTEROL: 216 mg/dL — AB (ref <200)
HDL: 54 mg/dL (ref >50)
LDL Cholesterol (Calc): 129 mg/dL (calc) — ABNORMAL HIGH
Non-HDL Cholesterol (Calc): 162 mg/dL (calc) — ABNORMAL HIGH (ref <130)
Total CHOL/HDL Ratio: 4 (calc) (ref <5.0)
Triglycerides: 194 mg/dL — ABNORMAL HIGH (ref <150)

## 2017-08-20 LAB — URIC ACID: URIC ACID, SERUM: 5 mg/dL (ref 2.5–7.0)

## 2017-08-20 LAB — HEPATIC FUNCTION PANEL
AG Ratio: 2.1 (calc) (ref 1.0–2.5)
ALT: 18 U/L (ref 6–29)
AST: 21 U/L (ref 10–35)
Albumin: 4.6 g/dL (ref 3.6–5.1)
Alkaline phosphatase (APISO): 97 U/L (ref 33–130)
BILIRUBIN DIRECT: 0.1 mg/dL (ref 0.0–0.2)
BILIRUBIN INDIRECT: 0.4 mg/dL (ref 0.2–1.2)
Globulin: 2.2 g/dL (calc) (ref 1.9–3.7)
TOTAL PROTEIN: 6.8 g/dL (ref 6.1–8.1)
Total Bilirubin: 0.5 mg/dL (ref 0.2–1.2)

## 2017-08-20 LAB — TSH: TSH: 2.44 m[IU]/L (ref 0.40–4.50)

## 2017-08-20 LAB — MAGNESIUM: Magnesium: 2 mg/dL (ref 1.5–2.5)

## 2017-08-20 LAB — HEMOGLOBIN A1C
Hgb A1c MFr Bld: 6.1 % of total Hgb — ABNORMAL HIGH (ref <5.7)
MEAN PLASMA GLUCOSE: 128 (calc)
eAG (mmol/L): 7.1 (calc)

## 2017-08-20 LAB — INSULIN, FASTING: INSULIN: 15.8 u[IU]/mL (ref 2.0–19.6)

## 2017-09-08 ENCOUNTER — Ambulatory Visit (HOSPITAL_BASED_OUTPATIENT_CLINIC_OR_DEPARTMENT_OTHER): Payer: PPO | Admitting: Hematology and Oncology

## 2017-09-08 ENCOUNTER — Telehealth: Payer: Self-pay | Admitting: Hematology and Oncology

## 2017-09-08 DIAGNOSIS — Z79811 Long term (current) use of aromatase inhibitors: Secondary | ICD-10-CM

## 2017-09-08 DIAGNOSIS — Z170 Estrogen receptor positive status [ER+]: Secondary | ICD-10-CM | POA: Diagnosis not present

## 2017-09-08 DIAGNOSIS — G620 Drug-induced polyneuropathy: Secondary | ICD-10-CM | POA: Diagnosis not present

## 2017-09-08 DIAGNOSIS — C50212 Malignant neoplasm of upper-inner quadrant of left female breast: Secondary | ICD-10-CM | POA: Diagnosis not present

## 2017-09-08 MED ORDER — FUROSEMIDE 20 MG PO TABS: 40.0000 mg | ORAL_TABLET | ORAL | 2 refills | Status: DC | PRN

## 2017-09-08 NOTE — Progress Notes (Signed)
Patient Care Team: [name], MD as PCP - Kinnie Scales, Ettrick as Referring Physician (Optometry) [name], MD as Consulting Physician (Dermatology) Latanya Maudlin, MD as Consulting Physician (Orthopedic Surgery) Inda Castle, MD as Consulting Physician (Gastroenterology) [name], MD as Consulting Physician (Hematology and Oncology)  DIAGNOSIS:  Encounter Diagnosis  Name Primary?  . Malignant neoplasm of upper-inner quadrant of left breast in female, estrogen receptor positive ([place])     SUMMARY OF ONCOLOGIC HISTORY:   Breast cancer of upper-inner quadrant of left female breast ([place])   02/26/2016 Initial Diagnosis    Left breast biopsy 11:00 position: invasive ductal carcinoma with DCIS, ER 90%, PR 10%, HER-2 negative, Ki-67 30%, grade 2, 2.2 cm palpable lesion T2 N0 stage II a clinical stage      03/18/2016 Surgery    Left lumpectomy: Invasive ductal carcinoma, grade 2, 6.3 cm, with high-grade DCIS, margins negative, 0/4 lymph nodes negative, ER 90%, via 10%, HER-2 negative ratio 0.97, Ki-67 30%, T3 N0 stage IIB      03/25/2016 Procedure    Genetic testing is negative for pathogenic mutations within any of the 20 Genes on the breast/ovarian cancer panel      04/04/2016 Oncotype testing    Oncotype DX recurrence score 37, 25% 10 year distant risk of recurrence      04/17/2016 - 07/31/2016 Chemotherapy    Adjuvant chemotherapy with dose dense Adriamycin and Cytoxan followed by Abraxane weekly 8 ( discontinued due to neuropathy)      09/01/2016 - 09/26/2016 Radiation Therapy    Adj XRT 1) Left breast: 42.5 Gy in 17 fractions. 2) Left breast boost: 7.5 Gy in 3 fractions.      12/02/2016 -  Anti-estrogen oral therapy    Letrozole 2.5 mg daily       CHIEF COMPLIANT: Follow-up on letrozole therapy  INTERVAL HISTORY: Kayla Baker is a 81 year old with above-mentioned history of left breast cancer treated with lumpectomy followed by adjuvant chemotherapy and  radiation and is currently on letrozole therapy. She continues to have severe hot flashes but they have slightly gotten better with the starting of Cymbalta. She also had lots of knee and joint pain which has improved with Cymbalta. She has stopped taking Elavil for that. She denies any lumps or nodules in the breast. She underwent breast reduction surgery in July 2018.  REVIEW OF SYSTEMS:   Constitutional: Denies fevers, chills or abnormal weight loss Eyes: Denies blurriness of vision Ears, nose, mouth, throat, and face: Denies mucositis or sore throat Respiratory: Denies cough, dyspnea or wheezes Cardiovascular: Denies palpitation, chest discomfort Gastrointestinal:  Denies nausea, heartburn or change in bowel habits Skin: Denies abnormal skin rashes Lymphatics: Denies new lymphadenopathy or easy bruising Neurological:Denies numbness, tingling or new weaknesses Behavioral/Psych: Mood is stable, no new changes  Extremities: No lower extremity edema Breast:  denies any pain or lumps or nodules in either breasts All other systems were reviewed with the patient and are negative.  I have reviewed the past medical history, past surgical history, social history and family history with the patient and they are unchanged from previous note.  ALLERGIES:  is allergic to keflex [cephalexin]; meloxicam; prednisone; premarin [estrogens conjugated]; and trovan [alatrofloxacin].  MEDICATIONS:  Current Outpatient Prescriptions  Medication Sig Dispense Refill  . acetaminophen (TYLENOL) 325 MG tablet Take 325 mg by mouth every 6 (six) hours as needed for headache.    . allopurinol (ZYLOPRIM) 300 MG tablet TAKE 1 TABLET BY MOUTH EVERY DAY  90 tablet 0  . amitriptyline (ELAVIL) 25 MG tablet TAKE 1 TO 2 TABLETS BY MOUTH AT BEDTIME FOR NEURITIS 180 tablet 1  . aspirin 81 MG tablet Take 81 mg by mouth daily.    . Cholecalciferol (VITAMIN D3) 5000 units CAPS Take 5,000 Units by mouth daily.    . DULoxetine  (CYMBALTA) 60 MG capsule Take 1 capsule daily for Neuropathy pain 90 capsule 1  . ezetimibe (ZETIA) 10 MG tablet TAKE 1 TABLET BY MOUTH DAILY FOR CHOLESTEROL 90 tablet 3  . furosemide (LASIX) 20 MG tablet Take 2 tablets (40 mg total) by mouth daily. 60 tablet 2  . gabapentin (NEURONTIN) 100 MG capsule TAKE 1 TO 3 CAPSULES BY MOUTH AT BEDTIME FOR NEURITIS 270 capsule 1  . glucose blood (FREESTYLE LITE) test strip USE ONE STRIP TO CHECK GLUCOSE ONCE DAILY. DX-E11.22 100 each 3  . hydrochlorothiazide (HYDRODIURIL) 25 MG tablet TAKE 1 TABLET BY MOUTH DAILY 90 tablet 1  . Lancets (FREESTYLE) lancets Check blood sugar one time daily-E11.22 100 each 3  . letrozole (FEMARA) 2.5 MG tablet Take 1 tablet daily 90 tablet 1  . loratadine (CLARITIN) 10 MG tablet Take 10 mg by mouth daily as needed for allergies.    . Magnesium ([employer] MAGNESIUM) 250 MG TABS Take 500 mg by mouth 2 (two) times daily.    . metFORMIN (GLUCOPHAGE XR) 500 MG 24 hr tablet Take 4 tablets daily for blood sugar (Patient taking differently: 1,000 mg. Take 4 tablets daily for blood sugar) 360 tablet 1  . metoprolol succinate (TOPROL-XL) 25 MG 24 hr tablet TAKE 1 TABLET(25 MG) BY MOUTH DAILY 90 tablet 3  . OVER THE COUNTER MEDICATION Takes Ranitidine 150 mg daily PRN.    . vitamin C (ASCORBIC ACID) 500 MG tablet Take 500 mg by mouth daily. Takes 2 tablets daily     No current facility-administered medications for this visit.     PHYSICAL EXAMINATION: ECOG PERFORMANCE STATUS: 1 - Symptomatic but completely ambulatory  Vitals:   09/08/17 1108  BP: 129/78  Pulse: 82  Resp: 18  Temp: 97.8 F (36.6 C)  SpO2: 98%   Filed Weights   09/08/17 1108  Weight: 141 lb 11.2 oz (64.3 kg)    GENERAL:alert, no distress and comfortable SKIN: skin color, texture, turgor are normal, no rashes or significant lesions EYES: normal, Conjunctiva are pink and non-injected, sclera clear OROPHARYNX:no exudate, no erythema and lips, buccal mucosa, and  tongue normal  NECK: supple, thyroid normal size, non-tender, without nodularity LYMPH:  no palpable lymphadenopathy in the cervical, axillary or inguinal LUNGS: clear to auscultation and percussion with normal breathing effort HEART: regular rate & rhythm and no murmurs and no lower extremity edema ABDOMEN:abdomen soft, non-tender and normal bowel sounds MUSCULOSKELETAL:no cyanosis of digits and no clubbing  NEURO: alert & oriented x 3 with fluent speech, no focal motor/sensory deficits EXTREMITIES: No lower extremity edema  LABORATORY DATA:  I have reviewed the data as listed   Chemistry      Component Value Date/Time   NA 143 08/19/2017 1108   NA 141 11/06/2016 1102   K 4.0 08/19/2017 1108   K 3.9 11/06/2016 1102   CL 104 08/19/2017 1108   CO2 29 08/19/2017 1108   CO2 27 11/06/2016 1102   BUN 27 (H) 08/19/2017 1108   BUN 18.0 11/06/2016 1102   CREATININE 1.35 (H) 08/19/2017 1108   CREATININE 1.3 (H) 11/06/2016 1102      Component Value Date/Time  CALCIUM 10.6 (H) 08/19/2017 1108   CALCIUM 10.2 11/06/2016 1102   ALKPHOS 103 05/04/2017 1059   ALKPHOS 159 (H) 11/06/2016 1102   AST 21 08/19/2017 1108   AST 52 (H) 11/06/2016 1102   ALT 18 08/19/2017 1108   ALT 58 (H) 11/06/2016 1102   BILITOT 0.5 08/19/2017 1108   BILITOT 0.53 11/06/2016 1102       Lab Results  Component Value Date   WBC 6.5 08/19/2017   HGB 12.8 08/19/2017   HCT 38.4 08/19/2017   MCV 91.4 08/19/2017   PLT 136 (L) 08/19/2017   NEUTROABS 3,510 08/19/2017    ASSESSMENT & PLAN:  Breast cancer of upper-inner quadrant of left female breast ([place]) Left lumpectomy 03/08/2016: Invasive ductal carcinoma, grade 2, 6.3 cm, with high-grade DCIS, margins negative, 0/4 lymph nodes negative, ER 90%, via 10%, HER-2 negative ratio 0.97, Ki-67 30%, T3 N0 stage IIB  Oncotype DX: Patient has a score of 37 which translates into 25% 10 year risk of distant recurrence with tamoxifen alone. Adjuvant chemotherapy with  dose dense Adriamycin and Cytoxan 4 followed by Abraxane weekly 8 ( stopped due to neuropathy) 04/17/2016 to 07/31/2016 Adj XRT from 09/01/16 to 09/26/16  Chemo induced neuropathy: Being monitored Current treatment: Anti estrogen therapy with Letrozole 2.5 mg daily started January 2018  Letrozole toxicities: 1. Severe hot flashes: Patient is currently on gabapentin along with Cymbalta.  Surveillance: 1. Breast exam 09/08/2017: Benign 2. mammogram 02/23/2017: Postsurgical changes  Return to clinic in 1 year   I spent 25 minutes talking to the patient of which more than half was spent in counseling and coordination of care.  No orders of the defined types were placed in this encounter.  The patient has a good understanding of the overall plan. she agrees with it. she will call with any problems that may develop before the next visit here.   Rulon Eisenmenger, MD 09/08/17

## 2017-09-08 NOTE — Telephone Encounter (Signed)
Gave patient avs and calendar with appts per 10/9 los.  °

## 2017-09-08 NOTE — Assessment & Plan Note (Addendum)
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 Adjuvant chemotherapy with dose dense Adriamycin and Cytoxan 4 followed by Abraxane weekly 8 ( stopped due to neuropathy) 04/17/2016 to 07/31/2016 Adj XRT from 09/01/16 to 09/26/16  Chemo induced neuropathy: Being monitored Current treatment: Anti estrogen therapy with Letrozole 2.5 mg daily started January 2018  Letrozole toxicities: 1. Severe hot flashes: I encouraged the patient to take the medication at bedtime or even take half a tablet in the morning and half tablet in the evening. If in spite of this her symptoms do not improve then we will switch her to anastrozole.  Surveillance: 1. Breast exam 09/08/2017: Benign 2. mammogram 02/23/2017: Postsurgical changes  Return to clinic in 1 year

## 2017-09-21 ENCOUNTER — Other Ambulatory Visit: Payer: Self-pay | Admitting: Internal Medicine

## 2017-09-30 ENCOUNTER — Other Ambulatory Visit: Payer: Self-pay | Admitting: Physician Assistant

## 2017-09-30 MED ORDER — ALLOPURINOL 300 MG PO TABS: 300.0000 mg | ORAL_TABLET | Freq: Every day | ORAL | 1 refills | Status: DC

## 2017-10-27 ENCOUNTER — Other Ambulatory Visit: Payer: Self-pay | Admitting: Internal Medicine

## 2017-10-27 DIAGNOSIS — E1140 Type 2 diabetes mellitus with diabetic neuropathy, unspecified: Secondary | ICD-10-CM

## 2017-10-28 ENCOUNTER — Other Ambulatory Visit: Payer: Self-pay | Admitting: Internal Medicine

## 2017-11-11 ENCOUNTER — Other Ambulatory Visit: Payer: Self-pay | Admitting: Hematology and Oncology

## 2017-11-16 ENCOUNTER — Other Ambulatory Visit: Payer: Self-pay

## 2017-11-16 MED ORDER — ANASTROZOLE 1 MG PO TABS: 1.0000 mg | ORAL_TABLET | Freq: Every day | ORAL | 3 refills | Status: DC

## 2017-11-16 NOTE — Telephone Encounter (Signed)
Returned pt call and informed her that [name] has agreed to take pt off of Letrozole and begin taking anastrozole. Script was sent in to pt [employer]. No further questions.

## 2017-11-19 ENCOUNTER — Other Ambulatory Visit: Payer: Self-pay | Admitting: Internal Medicine

## 2017-11-25 NOTE — Progress Notes (Signed)
FOLLOW UP  Assessment and Plan:   Hypertension Well controlled with current medications  Monitor blood pressure at home; patient to call if consistently greater than 130/80 Continue DASH diet.   Reminder to go to the ER if any CP, SOB, nausea, dizziness, severe HA, changes vision/speech, left arm numbness and tingling and jaw pain.  Cholesterol Treated by lifestyle and zetia; statin intolerant due to elevated LFTs in past.  Continue low cholesterol diet and exercise.  Check lipid panel.   Diabetes with diabetic chronic kidney disease Currently with slow weight loss and A1Cs in prediabetic range; consider reduction of metformin if continues Continue diet and exercise.  Perform daily foot/skin check, notify office of any concerning changes.  Check A1C  Diabetic neuropathy, painful (HCC) Currently prescribed cymbalta during the day, gabapentin 100-300 mg at night. She reports neuropathy pain limits her ability to perform exercise; advised she trial a low dose of gabapentin prior to exercise if tolerated without SE/excessive drowsiness. On review, vitamin B12 levels have never been checked for this patient; will do so today.   Vitamin D Def/ osteoporosis prevention Continue supplementation Check Vit D level  Continue diet and meds as discussed. Further disposition pending results of labs. Discussed med's effects and SE's.   Over 30 minutes of exam, counseling, chart review, and critical decision making was performed.   Future Appointments  Date Time Provider [place]  03/23/2018  9:00 AM [name], MD [facility] None  09/09/2018  8:45 AM [name], MD [facility] None    ----------------------------------------------------------------------------------------------------------------------  HPI 81 y.o. female  presents for 3 month follow up on hypertension, cholesterol, diabetes and vitamin D deficiency. Patient also has gout and GERD well controlled by  medications. 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 Most recently she had a Rt Mastopexy for symmetry by Dr Iran Planas. She reports her medication has been changed from letrozole to anastrozole last week; she reports she continues to do well.   Her blood pressure has been controlled at home, today their BP is BP: 124/72  She does not currently workout on a consistent basis due to ongoing neuropathy- she is prescribed cymbalta and gabapentin for neuropathy. She denies chest pain, shortness of breath, dizziness.   She is on cholesterol medication (zetia) and denies myalgias. She reports she was previously on atorvastatin but this was discontinued last year secondary to elevated LFTs. Her cholesterol is not at goal. The cholesterol last visit was:   Lab Results  Component Value Date   CHOL 216 (H) 08/19/2017   HDL 54 08/19/2017   LDLCALC 113 (H) 05/04/2017   TRIG 194 (H) 08/19/2017   CHOLHDL 4.0 08/19/2017    She has been working on diet and exercise for T2 diabetes, and denies increased appetite, nausea, polydipsia, polyuria, visual disturbances, vomiting and weight loss. Last A1C in the office was:  Lab Results  Component Value Date   HGBA1C 6.1 (H) 08/19/2017   Patient is on Vitamin D supplement and above goal at the last check:   Lab Results  Component Value Date   VD25OH 114 (H) 08/19/2017        Current Medications:  Current Outpatient Medications on File Prior to Visit  Medication Sig  . acetaminophen (TYLENOL) 325 MG tablet Take 325 mg by mouth every 6 (six)  hours as needed for headache.  . allopurinol (ZYLOPRIM) 300 MG tablet Take 1 tablet (300 mg total) by mouth daily.  Marland Kitchen anastrozole (ARIMIDEX) 1 MG tablet Take 1 tablet (1 mg total) by mouth daily.  Marland Kitchen aspirin 81 MG tablet Take 81 mg by  mouth daily.  . Cholecalciferol (VITAMIN D3) 5000 units CAPS Take 5,000 Units by mouth daily.  . DULoxetine (CYMBALTA) 60 MG capsule Take 1 capsule daily for Neuropathy pain  . ezetimibe (ZETIA) 10 MG tablet TAKE 1 TABLET BY MOUTH DAILY FOR CHOLESTEROL  . FREESTYLE TEST STRIPS test strip TEST BLOOD SUGAR LEVELS ONCE DAILY AS DIRECTED  . furosemide (LASIX) 20 MG tablet Take 2 tablets (40 mg total) by mouth as needed.  . gabapentin (NEURONTIN) 100 MG capsule TAKE 1-3 CAPSULE(S) BY MOUTH EVERY NIGHT AT BEDTIME FOR NEURITIS  . hydrochlorothiazide (HYDRODIURIL) 25 MG tablet TAKE 1 TABLET BY MOUTH DAILY  . Lancets (FREESTYLE) lancets Check blood sugar one time daily-E11.22  . loratadine (CLARITIN) 10 MG tablet Take 10 mg by mouth daily as needed for allergies.  . Magnesium ([employer] MAGNESIUM) 250 MG TABS Take 500 mg by mouth 2 (two) times daily.  . metFORMIN (GLUCOPHAGE-XR) 500 MG 24 hr tablet TAKE 4 TABLETS BY MOUTH DAILY FOR BLOOD SUGAR  . metoprolol succinate (TOPROL-XL) 25 MG 24 hr tablet TAKE 1 TABLET(25 MG) BY MOUTH DAILY  . OVER THE COUNTER MEDICATION Takes Ranitidine 150 mg daily PRN.  . vitamin C (ASCORBIC ACID) 500 MG tablet Take 500 mg by mouth daily. Takes 2 tablets daily  . Lancets (FREESTYLE) lancets USE TO CHECK BLOOD SUGAR ONCE DAILY AS DIRECTED   No current facility-administered medications on file prior to visit.      Allergies:  Allergies  Allergen Reactions  . Keflex [Cephalexin] Swelling    Tongue and throat swells  . Meloxicam Swelling  . Prednisone Swelling    Can Not take High doses  . Premarin [Estrogens Conjugated] Hives  . Trovan [Alatrofloxacin] Other (See Comments)    Unknown reaction     Medical History:  Past Medical History:  Diagnosis Date  . Anemia   . Breast cancer of upper-inner quadrant of left female breast ([place]) 02/29/2016   skin- 2016- squamous- on right upper arm  . Diet-controlled type 2 diabetes mellitus ([place])    diet and exercsise,Has never  been on meds, type II   . GERD (gastroesophageal reflux disease)   . Gout    takes Allopurinol daily  . History of chemotherapy   . History of colon polyps    benign  . History of shingles   . Hx of radiation therapy   . Hypertension   . Personal history of chemotherapy    2017  . Personal history of radiation therapy    2017  . Vitamin D deficiency    Family history- Reviewed and unchanged Social history- Reviewed and unchanged   Review of Systems:  Review of Systems  Constitutional: Negative for malaise/fatigue and weight loss.  HENT: Negative for hearing loss and tinnitus.   Eyes: Negative for blurred vision and double vision.  Respiratory: Negative for cough, shortness of breath and wheezing.   Cardiovascular: Negative for chest pain, palpitations, orthopnea, claudication and leg swelling.  Gastrointestinal: Negative for abdominal pain, blood in stool, constipation, diarrhea, heartburn, melena, nausea and vomiting.  Genitourinary: Negative.   Musculoskeletal: Negative for joint pain and myalgias.  Skin: Negative for rash.  Neurological: Negative for dizziness, tingling (Consistent with baseline in bilateral  feet), sensory change, weakness and headaches.  Endo/Heme/Allergies: Negative for polydipsia.  Psychiatric/Behavioral: Negative.  The patient does not have insomnia.   All other systems reviewed and are negative.   Physical Exam: BP 124/72   Pulse 74   Temp 97.9 F (36.6 C)   Ht 5' 3"  (1.6 m)   Wt 139 lb (63 kg)   SpO2 98%   BMI 24.62 kg/m  Wt Readings from Last 3 Encounters:  11/26/17 139 lb (63 kg)  09/08/17 141 lb 11.2 oz (64.3 kg)  08/19/17 142 lb 6.4 oz (64.6 kg)   General Appearance: Well nourished, in no apparent distress. Eyes: PERRLA, EOMs, conjunctiva no swelling or erythema Sinuses: No Frontal/maxillary tenderness ENT/Mouth: Ext aud canals clear, TMs without erythema, bulging. No erythema, swelling, or exudate on post pharynx.  Tonsils not  swollen or erythematous. Hearing normal.  Neck: Supple, thyroid normal.  Respiratory: Respiratory effort normal, BS equal bilaterally without rales, rhonchi, wheezing or stridor.  Cardio: RRR with no MRGs. Brisk peripheral pulses without edema.  Abdomen: Soft, + BS.  Non tender, no guarding, rebound, hernias, masses. Lymphatics: Non tender without lymphadenopathy.  Musculoskeletal: Full ROM, 5/5 strength, Normal gait Skin: Warm, dry without rashes, lesions, ecchymosis.  Neuro: Cranial nerves intact. No cerebellar symptoms.  Psych: Awake and oriented X 3, normal affect, Insight and Judgment appropriate.    Izora Ribas, NP 8:47 AM [facility]

## 2017-11-26 ENCOUNTER — Ambulatory Visit: Payer: PPO | Admitting: Adult Health

## 2017-11-26 ENCOUNTER — Encounter: Payer: Self-pay | Admitting: Adult Health

## 2017-11-26 VITALS — BP 124/72 | HR 74 | Temp 97.9°F | Ht 63.0 in | Wt 139.0 lb

## 2017-11-26 DIAGNOSIS — G620 Drug-induced polyneuropathy: Secondary | ICD-10-CM

## 2017-11-26 DIAGNOSIS — N183 Chronic kidney disease, stage 3 (moderate): Secondary | ICD-10-CM

## 2017-11-26 DIAGNOSIS — E538 Deficiency of other specified B group vitamins: Secondary | ICD-10-CM

## 2017-11-26 DIAGNOSIS — E1140 Type 2 diabetes mellitus with diabetic neuropathy, unspecified: Secondary | ICD-10-CM

## 2017-11-26 DIAGNOSIS — E782 Mixed hyperlipidemia: Secondary | ICD-10-CM

## 2017-11-26 DIAGNOSIS — I10 Essential (primary) hypertension: Secondary | ICD-10-CM

## 2017-11-26 DIAGNOSIS — Z79899 Other long term (current) drug therapy: Secondary | ICD-10-CM | POA: Diagnosis not present

## 2017-11-26 DIAGNOSIS — E1122 Type 2 diabetes mellitus with diabetic chronic kidney disease: Secondary | ICD-10-CM | POA: Diagnosis not present

## 2017-11-26 DIAGNOSIS — E559 Vitamin D deficiency, unspecified: Secondary | ICD-10-CM | POA: Diagnosis not present

## 2017-11-26 DIAGNOSIS — T451X5A Adverse effect of antineoplastic and immunosuppressive drugs, initial encounter: Secondary | ICD-10-CM

## 2017-11-26 NOTE — Patient Instructions (Signed)
Neuropathic Pain Neuropathic pain is pain caused by damage to the nerves that are responsible for certain sensations in your body (sensory nerves). The pain can be caused by damage to:  The sensory nerves that send signals to your spinal cord and brain (peripheral nervous system).  The sensory nerves in your brain or spinal cord (central nervous system).  Neuropathic pain can make you more sensitive to pain. What would be a minor sensation for most people may feel very painful if you have neuropathic pain. This is usually a long-term condition that can be difficult to treat. The type of pain can differ from person to person. It may start suddenly (acute), or it may develop slowly and last for a long time (chronic). Neuropathic pain may come and go as damaged nerves heal or may stay at the same level for years. It often causes emotional distress, loss of sleep, and a lower quality of life. What are the causes? The most common cause of damage to a sensory nerve is diabetes. Many other diseases and conditions can also cause neuropathic pain. Causes of neuropathic pain can be classified as:  Toxic. Many drugs and chemicals can cause toxic damage. The most common cause of toxic neuropathic pain is damage from drug treatment for cancer (chemotherapy).  Metabolic. This type of pain can happen when a disease causes imbalances that damage nerves. Diabetes is the most common of these diseases. Vitamin B deficiency caused by long-term alcohol abuse is another common cause.  Traumatic. Any injury that cuts, crushes, or stretches a nerve can cause damage and pain. A common example is feeling pain after losing an arm or leg (phantom limb pain).  Compression-related. If a sensory nerve gets trapped or compressed for a long period of time, the blood supply to the nerve can be cut off.  Vascular. Many blood vessel diseases can cause neuropathic pain by decreasing blood supply and oxygen to nerves.  Autoimmune.  This type of pain results from diseases in which the body's defense system mistakenly attacks sensory nerves. Examples of autoimmune diseases that can cause neuropathic pain include lupus and multiple sclerosis.  Infectious. Many types of viral infections can damage sensory nerves and cause pain. Shingles infection is a common cause of this type of pain.  Inherited. Neuropathic pain can be a symptom of many diseases that are passed down through families (genetic).  What are the signs or symptoms? The main symptom is pain. Neuropathic pain is often described as:  Burning.  Shock-like.  Stinging.  Hot or cold.  Itching.  How is this diagnosed? No single test can diagnose neuropathic pain. Your health care provider will do a physical exam and ask you about your pain. You may use a pain scale to describe how bad your pain is. You may also have tests to see if you have a high sensitivity to pain and to help find the cause and location of any sensory nerve damage. These tests may include:  Imaging studies, such as: ? X-rays. ? CT scan. ? MRI.  Nerve conduction studies to test how well nerve signals travel through your sensory nerves (electrodiagnostic testing).  Stimulating your sensory nerves through electrodes on your skin and measuring the response in your spinal cord and brain (somatosensory evoked potentials).  How is this treated? Treatment for neuropathic pain may change over time. You may need to try different treatment options or a combination of treatments. Some options include:  Over-the-counter pain relievers.  Prescription medicines. Some medicines  used to treat other conditions may also help neuropathic pain. These include medicines to: ? Control seizures (anticonvulsants). ? Relieve depression (antidepressants).  Prescription-strength pain relievers (narcotics). These are usually used when other pain relievers do not help.  Transcutaneous nerve stimulation (TENS).  This uses electrical currents to block painful nerve signals. The treatment is painless.  Topical and local anesthetics. These are medicines that numb the nerves. They can be injected as a nerve block or applied to the skin.  Alternative treatments, such as: ? Acupuncture. ? Meditation. ? Massage. ? Physical therapy. ? Pain management programs. ? Counseling.  Follow these instructions at home:  Learn as much as you can about your condition.  Take medicines only as directed by your health care provider.  Work closely with all your health care providers to find what works best for you.  Have a good support system at home.  Consider joining a chronic pain support group. Contact a health care provider if:  Your pain treatments are not helping.  You are having side effects from your medicines.  You are struggling with fatigue, mood changes, depression, or anxiety. This information is not intended to replace advice given to you by your health care provider. Make sure you discuss any questions you have with your health care provider. Document Released: 08/14/2004 Document Revised: 06/06/2016 Document Reviewed: 04/27/2014 Elsevier Interactive Patient Education  [employer].

## 2017-11-27 LAB — HEPATIC FUNCTION PANEL
AG RATIO: 1.9 (calc) (ref 1.0–2.5)
ALKALINE PHOSPHATASE (APISO): 102 U/L (ref 33–130)
ALT: 25 U/L (ref 6–29)
AST: 28 U/L (ref 10–35)
Albumin: 4.4 g/dL (ref 3.6–5.1)
BILIRUBIN INDIRECT: 0.7 mg/dL (ref 0.2–1.2)
BILIRUBIN TOTAL: 0.8 mg/dL (ref 0.2–1.2)
Bilirubin, Direct: 0.1 mg/dL (ref 0.0–0.2)
Globulin: 2.3 g/dL (calc) (ref 1.9–3.7)
TOTAL PROTEIN: 6.7 g/dL (ref 6.1–8.1)

## 2017-11-27 LAB — CBC WITH DIFFERENTIAL/PLATELET
BASOS ABS: 52 {cells}/uL (ref 0–200)
Basophils Relative: 1.2 %
EOS ABS: 138 {cells}/uL (ref 15–500)
EOS PCT: 3.2 %
HCT: 38.1 % (ref 35.0–45.0)
Hemoglobin: 12.7 g/dL (ref 11.7–15.5)
Lymphs Abs: 1066 cells/uL (ref 850–3900)
MCH: 30 pg (ref 27.0–33.0)
MCHC: 33.3 g/dL (ref 32.0–36.0)
MCV: 89.9 fL (ref 80.0–100.0)
MONOS PCT: 11.4 %
MPV: 10.1 fL (ref 7.5–12.5)
NEUTROS PCT: 59.4 %
Neutro Abs: 2554 cells/uL (ref 1500–7800)
Platelets: 135 10*3/uL — ABNORMAL LOW (ref 140–400)
RBC: 4.24 10*6/uL (ref 3.80–5.10)
RDW: 13.5 % (ref 11.0–15.0)
Total Lymphocyte: 24.8 %
WBC mixed population: 490 cells/uL (ref 200–950)
WBC: 4.3 10*3/uL (ref 3.8–10.8)

## 2017-11-27 LAB — BASIC METABOLIC PANEL WITH GFR
BUN / CREAT RATIO: 19 (calc) (ref 6–22)
BUN: 23 mg/dL (ref 7–25)
CO2: 34 mmol/L — ABNORMAL HIGH (ref 20–32)
Calcium: 10.7 mg/dL — ABNORMAL HIGH (ref 8.6–10.4)
Chloride: 101 mmol/L (ref 98–110)
Creat: 1.23 mg/dL — ABNORMAL HIGH (ref 0.60–0.93)
GFR, EST NON AFRICAN AMERICAN: 42 mL/min/{1.73_m2} — AB (ref >=60)
GFR, Est African American: 48 mL/min/{1.73_m2} — ABNORMAL LOW (ref >=60)
GLUCOSE: 113 mg/dL — AB (ref 65–99)
POTASSIUM: 4.1 mmol/L (ref 3.5–5.3)
SODIUM: 143 mmol/L (ref 135–146)

## 2017-11-27 LAB — LIPID PANEL
Cholesterol: 214 mg/dL — ABNORMAL HIGH (ref <200)
HDL: 62 mg/dL (ref >50)
LDL Cholesterol (Calc): 128 mg/dL (calc) — ABNORMAL HIGH
NON-HDL CHOLESTEROL (CALC): 152 mg/dL — AB (ref <130)
Total CHOL/HDL Ratio: 3.5 (calc) (ref <5.0)
Triglycerides: 126 mg/dL (ref <150)

## 2017-11-27 LAB — VITAMIN D 25 HYDROXY (VIT D DEFICIENCY, FRACTURES): VIT D 25 HYDROXY: 105 ng/mL — AB (ref 30–100)

## 2017-11-27 LAB — VITAMIN B12: VITAMIN B 12: 471 pg/mL (ref 200–1100)

## 2017-11-27 LAB — HEMOGLOBIN A1C
EAG (MMOL/L): 7 (calc)
HEMOGLOBIN A1C: 6 %{Hb} — AB (ref <5.7)
Mean Plasma Glucose: 126 (calc)

## 2017-11-27 LAB — TSH: TSH: 1.66 m[IU]/L (ref 0.40–4.50)

## 2017-12-01 ENCOUNTER — Other Ambulatory Visit: Payer: Self-pay | Admitting: Internal Medicine

## 2017-12-07 ENCOUNTER — Other Ambulatory Visit: Payer: Self-pay | Admitting: Hematology and Oncology

## 2017-12-07 DIAGNOSIS — Z853 Personal history of malignant neoplasm of breast: Secondary | ICD-10-CM

## 2018-01-30 ENCOUNTER — Other Ambulatory Visit: Payer: Self-pay | Admitting: Internal Medicine

## 2018-02-07 ENCOUNTER — Other Ambulatory Visit: Payer: Self-pay | Admitting: Internal Medicine

## 2018-02-08 ENCOUNTER — Encounter: Payer: Self-pay | Admitting: Internal Medicine

## 2018-02-16 LAB — HM DIABETES EYE EXAM

## 2018-02-24 ENCOUNTER — Encounter: Payer: Self-pay | Admitting: *Deleted

## 2018-03-01 ENCOUNTER — Ambulatory Visit
Admission: RE | Admit: 2018-03-01 | Discharge: 2018-03-01 | Disposition: A | Discharge status: Home / Self Care | Payer: PPO | Source: Ambulatory Visit | Attending: Hematology and Oncology | Admitting: Hematology and Oncology

## 2018-03-01 ENCOUNTER — Other Ambulatory Visit: Payer: Self-pay | Admitting: Hematology and Oncology

## 2018-03-01 DIAGNOSIS — N6310 Unspecified lump in the right breast, unspecified quadrant: Secondary | ICD-10-CM

## 2018-03-01 DIAGNOSIS — R928 Other abnormal and inconclusive findings on diagnostic imaging of breast: Secondary | ICD-10-CM | POA: Diagnosis not present

## 2018-03-01 DIAGNOSIS — N6320 Unspecified lump in the left breast, unspecified quadrant: Secondary | ICD-10-CM

## 2018-03-01 DIAGNOSIS — Z853 Personal history of malignant neoplasm of breast: Secondary | ICD-10-CM

## 2018-03-01 DIAGNOSIS — N644 Mastodynia: Secondary | ICD-10-CM | POA: Diagnosis not present

## 2018-03-02 ENCOUNTER — Ambulatory Visit
Admission: RE | Admit: 2018-03-02 | Discharge: 2018-03-02 | Disposition: A | Discharge status: Home / Self Care | Payer: PPO | Source: Ambulatory Visit | Attending: Hematology and Oncology | Admitting: Hematology and Oncology

## 2018-03-02 ENCOUNTER — Other Ambulatory Visit: Payer: Self-pay | Admitting: Hematology and Oncology

## 2018-03-02 DIAGNOSIS — N6489 Other specified disorders of breast: Secondary | ICD-10-CM | POA: Diagnosis not present

## 2018-03-02 DIAGNOSIS — N6310 Unspecified lump in the right breast, unspecified quadrant: Secondary | ICD-10-CM

## 2018-03-02 DIAGNOSIS — N6039 Fibrosclerosis of unspecified breast: Secondary | ICD-10-CM | POA: Diagnosis not present

## 2018-03-16 DIAGNOSIS — C50212 Malignant neoplasm of upper-inner quadrant of left female breast: Secondary | ICD-10-CM | POA: Diagnosis not present

## 2018-03-16 DIAGNOSIS — Z803 Family history of malignant neoplasm of breast: Secondary | ICD-10-CM | POA: Diagnosis not present

## 2018-03-22 NOTE — Progress Notes (Signed)
[facility] [name], M.D.     Uvaldo Bristle. Silverio Lay, P.A.-C Liane Comber, [facility] [address] [place], [place] [address] Telephone [phone] Telefax [phone] Annual Screening/Preventative Visit & Comprehensive Evaluation &  Examination     This very nice 82 y.o. MWF presents for a Screening/Preventative Visit & comprehensive evaluation and management of multiple medical co-morbidities.  Patient has been followed for HTN, HLD, T2_NIDDM and Vitamin D Deficiency. Patient's Gout is controlled on Allopurinol. Patient's GERD is controlled w/Ranitidine.      In Apr 2017, she underwent partial Lt Mastectomy for triple (+) Lt Breast Ca completing Chemo may-Aug 2017 and Radiation in Oct 2017 and started  Anastrozole Jan 2018  And continues close f/u w/Dr Lindi Adie.  On 2 April, she had a neg Bx of the Rt Breast.       HTN predates circa 2000. Patient's BP has been controlled at home and patient denies any cardiac symptoms as chest pain, palpitations, shortness of breath, dizziness or ankle swelling. Today's BP is at goal - 106/66.      Patient's hyperlipidemia is not controlled with diet and medications. Patient denies myalgias or other medication SE's. Last lipids were not at goal:  Lab Results  Component Value Date   CHOL 214 (H) 11/26/2017   HDL 62 11/26/2017   LDLCALC 128 (H) 11/26/2017   TRIG 126 11/26/2017   CHOLHDL 3.5 11/26/2017      Patient has T2_NIDDM since 2010 w/CKD3 (GFR42 in Dec) and painful peripheral neuropathy for which she's taking Duloxetine & Gabapentin. Patient denies reactive hypoglycemic symptoms, visual blurring, diabetic polys, but does endorse painful paresthesias. Last A1c was not at goal: Lab Results  Component Value Date   HGBA1C 6.0 (H) 11/26/2017      Finally, patient has history of Vitamin D Deficiency ("33"/2008)  and last Vitamin D was  Lab Results  Component Value Date    VD25OH 105 (H) 11/26/2017   Current Outpatient Medications on File Prior to Visit  Medication Sig  . acetaminophen 325 MG Take 325 mg by mouth every 6 (six) hours as needed for headache.  . allopurinol  300 MG tablet Take 1 tablet (300 mg total) by mouth daily.  . Anastrozole 1 MG tablet Take 1 tablet (1 mg total) by mouth daily.  Marland Kitchen aspirin 81 MG tablet Take 81 mg by mouth daily.  Marland Kitchen VITAMIN D3 5000 units  Take 5,000 Units by mouth daily.  . DULoxetine  60 MG capsule TAKE 1 CAP DAILY   . ezetimibe 10 MG tablet TAKE 1 TABDAILY  . furosemide 20 MG tablet Take 2 tab as needed.  . gabapentin  100 MG capsule TAKE 1-3 CAP EVERY NIGHT   . hctz  25 MG tablet TAKE ONE TAB DAILY  . Loratadine 10 MG tablet Take  daily as needed for allergies.  . Magnesium 250 MG TABS Take 500 mg  2  times daily.  . metFORMIN-XR 500 MG  TAKE 4 TAB DAILY  . metoprolol succ-XL 25 MG  TAKE 1 TAB DAILY  .  Ranitidine 150 mg Takes daily PRN.  . vitamin C  500 MG tablet Takes 2 tablets daily   Allergies  Allergen Reactions  . Keflex [Cephalexin] Swelling    Tongue and throat swells  . Meloxicam Swelling  . Prednisone Swelling    Can Not take High doses  . Premarin [Estrogens Conjugated] Hives  . Trovan [Alatrofloxacin] Other (See Comments)  Unknown reaction   Past Medical History:  Diagnosis Date  . Anemia   . Breast cancer of upper-inner quadrant of left female breast ([place]) 02/29/2016   skin- 2016- squamous- on right upper arm  . Diet-controlled type 2 diabetes mellitus ([facility])    diet and exercsise,Has never been on meds, type II   . GERD (gastroesophageal reflux disease)   . Gout    takes Allopurinol daily  . History of chemotherapy   . History of colon polyps    benign  . History of shingles   . Hx of radiation therapy   . Hypertension   . Personal history of chemotherapy    2017  . Personal history of radiation therapy    2017  . Vitamin D deficiency    Health Maintenance  Topic Date Due  .  URINE MICROALBUMIN  01/09/2018  . COLONOSCOPY  03/31/2018  . HEMOGLOBIN A1C  05/27/2018  . INFLUENZA VACCINE  07/01/2018  . OPHTHALMOLOGY EXAM  02/17/2019  . FOOT EXAM  03/23/2019  . TETANUS/TDAP  08/15/2019  . DEXA SCAN  Completed  . PNA vac Low Risk Adult  Completed   Immunization History  Administered Date(s) Administered  . DTaP 07/01/2004  . Influenza Whole 08/15/2013  . Influenza, High Dose Seasonal PF 08/28/2014, 09/20/2015, 08/19/2017  . Pneumococcal Conjugate-13 09/20/2015  . Pneumococcal Polysaccharide-23 11/01/2009  . Td 08/10/2009  . Zoster 08/02/2011   Last Colon - 03/2013 - Colonoscopy - Adenomatous polyps - Dr Deatra Ina recc 5 year f/u due 03/2018  Last MGM - 3 & 03/2018 with Neg Bx's.  Past Surgical History:  Procedure Laterality Date  . ABDOMINAL HYSTERECTOMY  1973  . BREAST BIOPSY Left 02/26/2016  . BREAST LUMPECTOMY Left 03/18/2016  . CARDIAC CATHETERIZATION    . CATARACT EXTRACTION, BILATERAL  2014   right eye 2/14; left eye 3/3  . COLONOSCOPY    . HAND SURGERY Left 2012  . INCONTINENCE SURGERY  2006  . KNEE ARTHROSCOPY Right   . MASTOPEXY Right 06/16/2017   Procedure: RIGHT BREAST MASTOPEXY;  Surgeon: Irene Limbo, MD;  Location: [facility];  Service: Plastics;  Laterality: Right;  . PORT-A-CATH REMOVAL N/A 11/27/2016   Procedure: REMOVAL PORT-A-CATH;  Surgeon: Fanny Skates, MD;  Location: [facility];  Service: General;  Laterality: N/A;  . PORTACATH PLACEMENT N/A 04/14/2016   Procedure: INSERTION PORT-A-CATH ;  Surgeon: Fanny Skates, MD;  Location: [facility];  Service: General;  Laterality: N/A;  . RADIOACTIVE SEED GUIDED PARTIAL MASTECTOMY WITH AXILLARY SENTINEL LYMPH NODE BIOPSY Left 03/18/2016   Procedure: RADIOACTIVE SEED GUIDED LEFT PARTIAL MASTECTOMY WITH AXILLARY SENTINEL LYMPH NODE BIOPSY AND BLUE DYE INJECTION;  Surgeon: Fanny Skates, MD;  Location: [facility];  Service: General;  Laterality: Left;  . REDUCTION MAMMAPLASTY Right 2018    Family History  Problem Relation Age of Onset  . Stroke Mother 65  . Other Mother        history of hysterectomy after last childbirth  . Diabetes Father   . Alzheimer's disease Father   . Hypertension Sister   . Lung cancer Sister 29       smoker  . Other Sister        history of hysterectomy for fibroids  . Breast cancer Sister 53  . Esophageal cancer Maternal Aunt 74       not a smoker  . Heart attack Maternal Uncle 65  . Cirrhosis Sister   . Lung cancer Other  nephew dx. 43s; +smoker  . Epilepsy Other   . Other Other 12       great niece dx. benign ganglioglioma brain tumor; [facility]  . Epilepsy Other        no seizures in 2 years  . Parkinson's disease Maternal Aunt   . Stroke Maternal Grandmother   . Heart Problems Maternal Grandmother   . Pancreatic cancer Cousin 92       paternal 1st cousin  . Colon cancer Neg Hx   . Stomach cancer Neg Hx    Social History   Tobacco Use  . Smoking status: Never Smoker  . Smokeless tobacco: Never Used  Substance Use Topics  . Alcohol use: No  . Drug use: No    ROS Constitutional: Denies fever, chills, weight loss/gain, headaches, insomnia,  night sweats, and change in appetite. Does c/o fatigue. Eyes: Denies redness, blurred vision, diplopia, discharge, itchy, watery eyes.  ENT: Denies discharge, congestion, post nasal drip, epistaxis, sore throat, earache, hearing loss, dental pain, Tinnitus, Vertigo, Sinus pain, snoring.  Cardio: Denies chest pain, palpitations, irregular heartbeat, syncope, dyspnea, diaphoresis, orthopnea, PND, claudication, edema Respiratory: denies cough, dyspnea, DOE, pleurisy, hoarseness, laryngitis, wheezing.  Gastrointestinal: Denies dysphagia, heartburn, reflux, water brash, pain, cramps, nausea, vomiting, bloating, diarrhea, constipation, hematemesis, melena, hematochezia, jaundice, hemorrhoids Genitourinary: Denies dysuria, frequency, urgency, nocturia, hesitancy, discharge,  hematuria, flank pain Breast: Breast lumps, nipple discharge, bleeding.  Musculoskeletal: Denies arthralgia, myalgia, stiffness, Jt. Swelling, pain, limp, and strain/sprain. Denies falls. Skin: Denies puritis, rash, hives, warts, acne, eczema, changing in skin lesion Neuro: No weakness, tremor, incoordination, spasms, paresthesia, pain Psychiatric: Denies confusion, memory loss, sensory loss. Denies Depression. Endocrine: Denies change in weight, skin, hair change, nocturia, and paresthesia, diabetic polys, visual blurring, hyper / hypo glycemic episodes.  Heme/Lymph: No excessive bleeding, bruising, enlarged lymph nodes.  Physical Exam  BP 106/66   Pulse 68   Temp (!) 97.5 F (36.4 C)   Resp 16   Ht 5' 3.5" (1.613 m)   Wt 140 lb 12.8 oz (63.9 kg)   BMI 24.55 kg/m   General Appearance: Well nourished, well groomed and in no apparent distress.  Eyes: PERRLA, EOMs, conjunctiva no swelling or erythema, normal fundi and vessels. Sinuses: No frontal/maxillary tenderness ENT/Mouth: EACs patent / TMs  nl. Nares clear without erythema, swelling, mucoid exudates. Oral hygiene is good. No erythema, swelling, or exudate. Tongue normal, non-obstructing. Tonsils not swollen or erythematous. Hearing normal.  Neck: Supple, thyroid not palpable. No bruits, nodes or JVD. Respiratory: Respiratory effort normal.  BS equal and clear bilateral without rales, rhonci, wheezing or stridor. Cardio: Heart sounds are normal with regular rate and rhythm and no murmurs, rubs or gallops. Peripheral pulses are normal and equal bilaterally without edema. No aortic or femoral bruits. Chest: symmetric with normal excursions and percussion. Breasts: Deferred to recent exam by Dr Leane Para & MGM's. Abdomen: Flat, soft with bowel sounds active. Nontender, no guarding, rebound, hernias, masses, or organomegaly.  Lymphatics: Non tender without lymphadenopathy.  Musculoskeletal: Deceased ROM of the Rt Shoulder due to pain.  Normal gait. Skin: Warm and dry without rashes, lesions, cyanosis, clubbing or  ecchymosis.  Neuro: Cranial nerves intact, reflexes equal bilaterally. Normal muscle tone, no cerebellar symptoms. Sensation decreased in a stocking distribution to touch, vibratory and Monofilament from distal shins to the toes bilaterally. Pysch: Alert and oriented X 3, normal affect, Insight and Judgment appropriate.   Assessment and Plan  1. Annual Preventative Screening Examination  2. Essential hypertension  - EKG 12-Lead - Urinalysis, Routine w reflex microscopic - Microalbumin / creatinine urine ratio - CBC with Differential/Platelet - COMPLETE METABOLIC PANEL WITH GFR - Magnesium - TSH  3. Hyperlipidemia, mixed  - EKG 12-Lead - Lipid panel - TSH  4. Type 2 diabetes mellitus with stage 3 chronic kidney disease, without long-term current use of insulin (HCC)  - EKG 12-Lead - Urinalysis, Routine w reflex microscopic - Microalbumin / creatinine urine ratio - Hemoglobin A1c - Insulin, random  5. Vitamin D deficiency  - VITAMIN D 25 Hydroxyl  6. Diabetic neuropathy, painful ([place])  - [facility] DIABETES FOOT EXAM - LOW EXTREMITY NEUR EXAM DOCUM  7. DM type 2 with diabetic mixed hyperlipidemia (HCC)  - Lipid panel  8. Idiopathic gout, hx  - Uric acid  9. Gastroesophageal reflux disease  - CBC with Differential/Platelet  10. Screening for ischemic heart disease  - EKG 12-Lead  11. FHx: heart disease  - EKG 12-Lead  12. Medication management  - Urinalysis, Routine w reflex microscopic - Microalbumin / creatinine urine ratio - Uric acid - CBC with Differential/Platelet - COMPLETE METABOLIC PANEL WITH GFR - Magnesium - Lipid panel - TSH - Hemoglobin A1c - Insulin, random - VITAMIN D 25 Hydroxyl  13. Chronic right shoulder pain,  ? Frozen shoulder  - Ambulatory referral to Orthopedic Surgery           Patient was counseled in prudent diet to achieve/maintain BMI less  than 25 for weight control, BP monitoring, regular exercise and medications. Discussed med's effects and SE's. Screening labs and tests as requested with regular follow-up as recommended. Over 40 minutes of exam, counseling, chart review and high complex critical decision making was performed.

## 2018-03-22 NOTE — Patient Instructions (Signed)

## 2018-03-23 ENCOUNTER — Ambulatory Visit (INDEPENDENT_AMBULATORY_CARE_PROVIDER_SITE_OTHER): Payer: PPO | Admitting: Internal Medicine

## 2018-03-23 ENCOUNTER — Encounter: Payer: Self-pay | Admitting: Internal Medicine

## 2018-03-23 VITALS — BP 106/66 | HR 68 | Temp 97.5°F | Resp 16 | Ht 63.5 in | Wt 140.8 lb

## 2018-03-23 DIAGNOSIS — Z136 Encounter for screening for cardiovascular disorders: Secondary | ICD-10-CM

## 2018-03-23 DIAGNOSIS — G8929 Other chronic pain: Secondary | ICD-10-CM

## 2018-03-23 DIAGNOSIS — Z0001 Encounter for general adult medical examination with abnormal findings: Secondary | ICD-10-CM

## 2018-03-23 DIAGNOSIS — E1122 Type 2 diabetes mellitus with diabetic chronic kidney disease: Secondary | ICD-10-CM

## 2018-03-23 DIAGNOSIS — Z8249 Family history of ischemic heart disease and other diseases of the circulatory system: Secondary | ICD-10-CM

## 2018-03-23 DIAGNOSIS — Z79899 Other long term (current) drug therapy: Secondary | ICD-10-CM

## 2018-03-23 DIAGNOSIS — E559 Vitamin D deficiency, unspecified: Secondary | ICD-10-CM

## 2018-03-23 DIAGNOSIS — M25511 Pain in right shoulder: Secondary | ICD-10-CM

## 2018-03-23 DIAGNOSIS — E782 Mixed hyperlipidemia: Secondary | ICD-10-CM | POA: Diagnosis not present

## 2018-03-23 DIAGNOSIS — N183 Chronic kidney disease, stage 3 (moderate): Secondary | ICD-10-CM

## 2018-03-23 DIAGNOSIS — Z0000 Encounter for general adult medical examination without abnormal findings: Secondary | ICD-10-CM | POA: Diagnosis not present

## 2018-03-23 DIAGNOSIS — E1140 Type 2 diabetes mellitus with diabetic neuropathy, unspecified: Secondary | ICD-10-CM

## 2018-03-23 DIAGNOSIS — M1000 Idiopathic gout, unspecified site: Secondary | ICD-10-CM

## 2018-03-23 DIAGNOSIS — E1169 Type 2 diabetes mellitus with other specified complication: Secondary | ICD-10-CM

## 2018-03-23 DIAGNOSIS — I10 Essential (primary) hypertension: Secondary | ICD-10-CM | POA: Diagnosis not present

## 2018-03-23 DIAGNOSIS — K219 Gastro-esophageal reflux disease without esophagitis: Secondary | ICD-10-CM

## 2018-03-29 LAB — MICROALBUMIN / CREATININE URINE RATIO
CREATININE, URINE: 110 mg/dL (ref 20–275)
MICROALB UR: 0.4 mg/dL
Microalb Creat Ratio: 4 mcg/mg creat (ref <30)

## 2018-03-29 LAB — COMPLETE METABOLIC PANEL WITH GFR
AG RATIO: 1.7 (calc) (ref 1.0–2.5)
ALT: 15 U/L (ref 6–29)
AST: 22 U/L (ref 10–35)
Albumin: 4.7 g/dL (ref 3.6–5.1)
Alkaline phosphatase (APISO): 106 U/L (ref 33–130)
BILIRUBIN TOTAL: 0.7 mg/dL (ref 0.2–1.2)
BUN/Creatinine Ratio: 20 (calc) (ref 6–22)
BUN: 32 mg/dL — ABNORMAL HIGH (ref 7–25)
CALCIUM: 10.9 mg/dL — AB (ref 8.6–10.4)
CHLORIDE: 100 mmol/L (ref 98–110)
CO2: 32 mmol/L (ref 20–32)
Creat: 1.63 mg/dL — ABNORMAL HIGH (ref 0.60–0.93)
GFR, Est African American: 34 mL/min/{1.73_m2} — ABNORMAL LOW (ref >=60)
GFR, Est Non African American: 30 mL/min/{1.73_m2} — ABNORMAL LOW (ref >=60)
Globulin: 2.8 g/dL (calc) (ref 1.9–3.7)
Glucose, Bld: 102 mg/dL — ABNORMAL HIGH (ref 65–99)
POTASSIUM: 3.9 mmol/L (ref 3.5–5.3)
Sodium: 141 mmol/L (ref 135–146)
Total Protein: 7.5 g/dL (ref 6.1–8.1)

## 2018-03-29 LAB — URINALYSIS, ROUTINE W REFLEX MICROSCOPIC
Bilirubin Urine: NEGATIVE
Glucose, UA: NEGATIVE
Hgb urine dipstick: NEGATIVE
Hyaline Cast: NONE SEEN /LPF
KETONES UR: NEGATIVE
Nitrite: POSITIVE — AB
Protein, ur: NEGATIVE
RBC / HPF: NONE SEEN /HPF (ref 0–2)
SPECIFIC GRAVITY, URINE: 1.015 (ref 1.001–1.03)
SQUAMOUS EPITHELIAL / LPF: NONE SEEN /HPF (ref <=5)
pH: 7 (ref 5.0–8.0)

## 2018-03-29 LAB — MAGNESIUM: MAGNESIUM: 2.1 mg/dL (ref 1.5–2.5)

## 2018-03-29 LAB — CBC WITH DIFFERENTIAL/PLATELET
Basophils Absolute: 59 cells/uL (ref 0–200)
Basophils Relative: 0.9 %
Eosinophils Absolute: 218 cells/uL (ref 15–500)
Eosinophils Relative: 3.3 %
HCT: 39.2 % (ref 35.0–45.0)
Hemoglobin: 13.4 g/dL (ref 11.7–15.5)
Lymphs Abs: 2145 cells/uL (ref 850–3900)
MCH: 30.2 pg (ref 27.0–33.0)
MCHC: 34.2 g/dL (ref 32.0–36.0)
MCV: 88.5 fL (ref 80.0–100.0)
MPV: 10.3 fL (ref 7.5–12.5)
Monocytes Relative: 7.9 %
NEUTROS PCT: 55.4 %
Neutro Abs: 3656 cells/uL (ref 1500–7800)
PLATELETS: 176 10*3/uL (ref 140–400)
RBC: 4.43 10*6/uL (ref 3.80–5.10)
RDW: 13.7 % (ref 11.0–15.0)
TOTAL LYMPHOCYTE: 32.5 %
WBC: 6.6 10*3/uL (ref 3.8–10.8)
WBCMIX: 521 {cells}/uL (ref 200–950)

## 2018-03-29 LAB — HEMOGLOBIN A1C
EAG (MMOL/L): 7.3 (calc)
HEMOGLOBIN A1C: 6.2 %{Hb} — AB (ref <5.7)
MEAN PLASMA GLUCOSE: 131 (calc)

## 2018-03-29 LAB — LIPID PANEL
CHOLESTEROL: 246 mg/dL — AB (ref <200)
HDL: 51 mg/dL (ref >50)
LDL Cholesterol (Calc): 164 mg/dL (calc) — ABNORMAL HIGH
Non-HDL Cholesterol (Calc): 195 mg/dL (calc) — ABNORMAL HIGH (ref <130)
Total CHOL/HDL Ratio: 4.8 (calc) (ref <5.0)
Triglycerides: 159 mg/dL — ABNORMAL HIGH (ref <150)

## 2018-03-29 LAB — TSH: TSH: 3.63 mIU/L (ref 0.40–4.50)

## 2018-03-29 LAB — VITAMIN D 25 HYDROXY (VIT D DEFICIENCY, FRACTURES): VIT D 25 HYDROXY: 68 ng/mL (ref 30–100)

## 2018-03-29 LAB — URIC ACID: URIC ACID, SERUM: 4.9 mg/dL (ref 2.5–7.0)

## 2018-03-29 LAB — INSULIN, RANDOM: Insulin: 16.3 u[IU]/mL (ref 2.0–19.6)

## 2018-04-04 ENCOUNTER — Encounter: Payer: Self-pay | Admitting: Gastroenterology

## 2018-04-06 ENCOUNTER — Encounter: Payer: Self-pay | Admitting: Gastroenterology

## 2018-04-13 DIAGNOSIS — M7501 Adhesive capsulitis of right shoulder: Secondary | ICD-10-CM | POA: Diagnosis not present

## 2018-04-13 DIAGNOSIS — M25511 Pain in right shoulder: Secondary | ICD-10-CM | POA: Diagnosis not present

## 2018-04-27 DIAGNOSIS — M7501 Adhesive capsulitis of right shoulder: Secondary | ICD-10-CM | POA: Insufficient documentation

## 2018-05-08 ENCOUNTER — Other Ambulatory Visit: Payer: Self-pay | Admitting: Physician Assistant

## 2018-05-08 ENCOUNTER — Other Ambulatory Visit: Payer: Self-pay | Admitting: Internal Medicine

## 2018-05-10 ENCOUNTER — Other Ambulatory Visit: Payer: Self-pay | Admitting: Adult Health

## 2018-05-27 ENCOUNTER — Other Ambulatory Visit: Payer: Self-pay

## 2018-05-27 ENCOUNTER — Ambulatory Visit (AMBULATORY_SURGERY_CENTER): Payer: Self-pay | Admitting: *Deleted

## 2018-05-27 VITALS — Ht 64.0 in | Wt 142.8 lb

## 2018-05-27 DIAGNOSIS — Z86010 Personal history of colonic polyps: Secondary | ICD-10-CM

## 2018-05-27 NOTE — Progress Notes (Signed)
No egg or soy allergy known to patient  No issues with past sedation with any surgeries  or procedures, no intubation problems  No diet pills per patient No home 02 use per patient  No blood thinners per patient  Pt denies issues with constipation  No A fib or A flutter  EMMI video offered and patient declined.

## 2018-06-09 ENCOUNTER — Encounter: Payer: Self-pay | Admitting: Gastroenterology

## 2018-06-10 ENCOUNTER — Other Ambulatory Visit: Payer: Self-pay | Admitting: Internal Medicine

## 2018-06-17 ENCOUNTER — Encounter: Payer: Self-pay | Admitting: Gastroenterology

## 2018-06-17 ENCOUNTER — Other Ambulatory Visit: Payer: Self-pay

## 2018-06-17 ENCOUNTER — Ambulatory Visit (AMBULATORY_SURGERY_CENTER): Payer: PPO | Admitting: Gastroenterology

## 2018-06-17 VITALS — BP 127/59 | HR 66 | Temp 98.2°F | Resp 11 | Ht 63.5 in | Wt 140.0 lb

## 2018-06-17 DIAGNOSIS — Z86010 Personal history of colonic polyps: Secondary | ICD-10-CM

## 2018-06-17 DIAGNOSIS — E119 Type 2 diabetes mellitus without complications: Secondary | ICD-10-CM | POA: Diagnosis not present

## 2018-06-17 DIAGNOSIS — I10 Essential (primary) hypertension: Secondary | ICD-10-CM | POA: Diagnosis not present

## 2018-06-17 MED ORDER — SODIUM CHLORIDE 0.9 % IV SOLN
500.0000 mL | Freq: Once | INTRAVENOUS | Status: DC
Start: 2018-06-17 — End: 2018-10-17

## 2018-06-17 NOTE — Progress Notes (Signed)
Report given to PACU, vss 

## 2018-06-17 NOTE — Op Note (Signed)
[facility] Patient Name: Yocelyn Brocious Procedure Date: 06/17/2018 10:31 AM MRN: [id number] Endoscopist: Mauri Pole , MD Age: 82 Referring MD:  Date of Birth: [birth year] Gender: Female Account #: [id number] Procedure:                Colonoscopy Indications:              High risk colon cancer surveillance: Personal                            history of colonic polyps, Surveillance: Personal                            history of adenomatous polyps on last colonoscopy 5                            years ago Medicines:                Monitored Anesthesia Care Procedure:                Pre-Anesthesia Assessment:                           - Prior to the procedure, a History and Physical                            was performed, and patient medications and                            allergies were reviewed. The patient's tolerance of                            previous anesthesia was also reviewed. The risks                            and benefits of the procedure and the sedation                            options and risks were discussed with the patient.                            All questions were answered, and informed consent                            was obtained. Prior Anticoagulants: The patient has                            taken no previous anticoagulant or antiplatelet                            agents. ASA Grade Assessment: II - A patient with                            mild systemic disease. After reviewing the risks  and benefits, the patient was deemed in                            satisfactory condition to undergo the procedure.                           After obtaining informed consent, the colonoscope                            was passed under direct vision. Throughout the                            procedure, the patient's blood pressure, pulse, and                            oxygen saturations were monitored continuously. The                             Colonoscope was introduced through the anus and                            advanced to the the cecum, identified by                            appendiceal orifice and ileocecal valve. The                            colonoscopy was performed without difficulty. The                            patient tolerated the procedure well. The quality                            of the bowel preparation was good. The ileocecal                            valve, appendiceal orifice, and rectum were                            photographed. Scope In: 10:33:08 AM Scope Out: 10:52:22 AM Scope Withdrawal Time: 0 hours 11 minutes 58 seconds  Total Procedure Duration: 0 hours 19 minutes 14 seconds  Findings:                 The perianal and digital rectal examinations were                            normal.                           Scattered small and large-mouthed diverticula were                            found in the sigmoid colon and descending colon.  Non-bleeding internal hemorrhoids were found during                            retroflexion. The hemorrhoids were medium-sized.                           The exam was otherwise without abnormality. Complications:            No immediate complications. Estimated Blood Loss:     Estimated blood loss was minimal. Impression:               - Diverticulosis in the sigmoid colon and in the                            descending colon.                           - Non-bleeding internal hemorrhoids.                           - The examination was otherwise normal.                           - No specimens collected. Recommendation:           - Patient has a contact number available for                            emergencies. The signs and symptoms of potential                            delayed complications were discussed with the                            patient. Return to normal activities tomorrow.                             Written discharge instructions were provided to the                            patient.                           - Resume previous diet.                           - Continue present medications.                           - No repeat colonoscopy due to age.                           - Return to GI clinic PRN. Mauri Pole, MD 06/17/2018 10:55:37 AM This report has been signed electronically.

## 2018-06-17 NOTE — Patient Instructions (Signed)
**   Handouts given on diverticulosis and hemorrhoids ** Follow up at GI office as needed :)   YOU HAD AN ENDOSCOPIC PROCEDURE TODAY AT [facility]:   Refer to the procedure report that was given to you for any specific questions about what was found during the examination.  If the procedure report does not answer your questions, please call your gastroenterologist to clarify.  If you requested that your care partner not be given the details of your procedure findings, then the procedure report has been included in a sealed envelope for you to review at your convenience later.  YOU SHOULD EXPECT: Some feelings of bloating in the abdomen. Passage of more gas than usual.  Walking can help get rid of the air that was put into your GI tract during the procedure and reduce the bloating. If you had a lower endoscopy (such as a colonoscopy or flexible sigmoidoscopy) you may notice spotting of blood in your stool or on the toilet paper. If you underwent a bowel prep for your procedure, you may not have a normal bowel movement for a few days.  Please Note:  You might notice some irritation and congestion in your nose or some drainage.  This is from the oxygen used during your procedure.  There is no need for concern and it should clear up in a day or so.  SYMPTOMS TO REPORT IMMEDIATELY:   Following lower endoscopy (colonoscopy or flexible sigmoidoscopy):  Excessive amounts of blood in the stool  Significant tenderness or worsening of abdominal pains  Swelling of the abdomen that is new, acute  Fever of 100F or higher  For urgent or emergent issues, a gastroenterologist can be reached at any hour by calling [phone].   DIET:  We do recommend a small meal at first, but then you may proceed to your regular diet.  Drink plenty of fluids but you should avoid alcoholic beverages for 24 hours.  ACTIVITY:  You should plan to take it easy for the rest of today and you should NOT DRIVE or  use heavy machinery until tomorrow (because of the sedation medicines used during the test).    FOLLOW UP: Our staff will call the number listed on your records the next business day following your procedure to check on you and address any questions or concerns that you may have regarding the information given to you following your procedure. If we do not reach you, we will leave a message.  However, if you are feeling well and you are not experiencing any problems, there is no need to return our call.  We will assume that you have returned to your regular daily activities without incident.  If any biopsies were taken you will be contacted by phone or by letter within the next 1-3 weeks.  Please call us at [phone] if you have not heard about the biopsies in 3 weeks.    SIGNATURES/CONFIDENTIALITY: You and/or your care partner have signed paperwork which will be entered into your electronic medical record.  These signatures attest to the fact that that the information above on your After Visit Summary has been reviewed and is understood.  Full responsibility of the confidentiality of this discharge information lies with you and/or your care-partner.

## 2018-06-21 ENCOUNTER — Telehealth: Payer: Self-pay

## 2018-06-21 DIAGNOSIS — D649 Anemia, unspecified: Secondary | ICD-10-CM | POA: Diagnosis not present

## 2018-06-21 DIAGNOSIS — N2581 Secondary hyperparathyroidism of renal origin: Secondary | ICD-10-CM | POA: Diagnosis not present

## 2018-06-21 DIAGNOSIS — M109 Gout, unspecified: Secondary | ICD-10-CM | POA: Diagnosis not present

## 2018-06-21 DIAGNOSIS — Z170 Estrogen receptor positive status [ER+]: Secondary | ICD-10-CM | POA: Diagnosis not present

## 2018-06-21 DIAGNOSIS — I129 Hypertensive chronic kidney disease with stage 1 through stage 4 chronic kidney disease, or unspecified chronic kidney disease: Secondary | ICD-10-CM | POA: Diagnosis not present

## 2018-06-21 DIAGNOSIS — C50212 Malignant neoplasm of upper-inner quadrant of left female breast: Secondary | ICD-10-CM | POA: Diagnosis not present

## 2018-06-21 DIAGNOSIS — N183 Chronic kidney disease, stage 3 (moderate): Secondary | ICD-10-CM | POA: Diagnosis not present

## 2018-06-21 DIAGNOSIS — E1122 Type 2 diabetes mellitus with diabetic chronic kidney disease: Secondary | ICD-10-CM | POA: Diagnosis not present

## 2018-06-21 NOTE — Telephone Encounter (Signed)
  Follow up Call-  Call back number 06/17/2018  Post procedure Call Back phone  # [phone]  Permission to leave phone message Yes  Some recent data might be hidden     Patient questions:  Do you have a fever, pain , or abdominal swelling? No. Pain Score  0 *  Have you tolerated food without any problems? Yes.    Have you been able to return to your normal activities? Yes.    Do you have any questions about your discharge instructions: Diet   No. Medications  No. Follow up visit  No.  Do you have questions or concerns about your Care? No.  Actions: * If pain score is 4 or above: No action needed, pain <4.

## 2018-06-22 ENCOUNTER — Other Ambulatory Visit: Payer: Self-pay | Admitting: Internal Medicine

## 2018-07-05 ENCOUNTER — Other Ambulatory Visit: Payer: Self-pay | Admitting: Internal Medicine

## 2018-07-05 DIAGNOSIS — N183 Chronic kidney disease, stage 3 unspecified: Secondary | ICD-10-CM | POA: Insufficient documentation

## 2018-07-05 DIAGNOSIS — E782 Mixed hyperlipidemia: Secondary | ICD-10-CM

## 2018-07-05 DIAGNOSIS — E1122 Type 2 diabetes mellitus with diabetic chronic kidney disease: Secondary | ICD-10-CM | POA: Insufficient documentation

## 2018-07-05 NOTE — Progress Notes (Signed)
MEDICARE ANNUAL WELLNESS VISIT AND FOLLOW UP  Assessment:     Encounter for Medicare annual wellness exam  Essential hypertension Continue medication Monitor blood pressure at home; call if consistently over 130/80 Continue DASH diet.   Reminder to go to the ER if any CP, SOB, nausea, dizziness, severe HA, changes vision/speech, left arm numbness and tingling and jaw pain.  Type 2 diabetes mellitus with stage 3 chronic kidney disease, without long-term current use of insulin [facility]) Education: Reviewed 'ABCs' of diabetes management (respective goals in parentheses):  A1C (<7), blood pressure (<130/80), and cholesterol (LDL <70) Eye Exam yearly and Dental Exam every 6 months. 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 Hollie Salk  Vitamin D deficiency At goal at recent check; continue to recommend supplementation for goal of 70-100 Defer vitamin D level  Port catheter in place Follow up PRN gen surgery, monitor closely for infection  Medication management CBC, CMP/GFR, magnesium  Hyperlipidemia Continue medications Continue low cholesterol diet and exercise.  Check lipid panel.   Idiopathic gout, unspecified chronicity, unspecified site Continue allopurinol Diet discussed Check uric acid as needed  Malignant neoplasm of upper-inner quadrant of left breast in female, estrogen receptor positive ([place]) Followed by [name], continue anastrozole  BMI 24.0-24.9, adult Continue to recommend diet heavy in fruits and veggies and low in animal meats, cheeses, and dairy products, appropriate calorie intake Discuss exercise recommendations routinely Continue to monitor weight at each visit     Over 30 minutes of  exam, counseling, chart review, and critical decision making was performed  Plan:   During the course of the visit the patient was educated and counseled about appropriate screening and preventive services including:    Pneumococcal vaccine   Influenza vaccine  Td vaccine  Prevnar 13  Screening electrocardiogram  Screening mammography  Bone densitometry screening  Colorectal cancer screening  Diabetes screening  Glaucoma screening  Nutrition counseling   Advanced directives: given info/requested copies  Conditions/risks identified: Diabetes is at goal, ACE/ARB therapy: No, Reason not on Ace Inhibitor/ARB therapy:  Hypotension Urinary Incontinence is not an issue: discussed non pharmacology and pharmacology options.  Fall risk: low- discussed PT, home fall assessment, medications.    Subjective:   Kayla Baker is a 82 y.o. female who presents for Medicare Annual Wellness Visit and 3 month follow up on hypertension, T2 diabetes, hyperlipidemia, vitamin D def. [name] 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 She has since been transitioned anastrozole; she reports she continues to do well. Mammogram from 03/2018 was initially concerning for distortion but biopsy was unremarkable.   BMI is Body mass index is 24.59 kg/m., she has been working on diet, pushing fruits/vegetables, watching white carbs; she does have [employer] membership but hasn't been going. She is very active with housework and yardwork.  Wt Readings from Last 3 Encounters:  07/06/18 141 lb (64 kg)  06/17/18 140 lb (63.5 kg)  05/27/18 142 lb 12.8 oz (64.8 kg)   Her blood pressure has been controlled at home, today their BP is BP: 122/74  She does not workout. She denies  chest pain, shortness of breath, dizziness.   She is on cholesterol medication (zetia due to hx of LFT elevation with statin) and denies myalgias. Her cholesterol is at goal. The cholesterol last visit was:   Lab Results  Component Value Date   CHOL 246 (H) 03/23/2018   HDL 51 03/23/2018   LDLCALC 164 (H) 03/23/2018   TRIG 159 (H) 03/23/2018   CHOLHDL 4.8 03/23/2018  She reports that she has been doing well at night time.     She has been working on diet and exercise for T2 diabetes on metformin (takes 2 tabs of 500 mg daily), and denies foot ulcerations, hyperglycemia, hypoglycemia , increased appetite, nausea, polydipsia, polyuria, visual disturbances, vomiting and weight loss.She reports that her blood sugars have been running 107-109. She does have some mild neuropathy in her feet.    Last A1C in the office was:  Lab Results  Component Value Date   HGBA1C 6.2 (H) 03/23/2018   Last GFR; followed by Dr. Hollie Salk Lab Results  Component Value Date   GFRNONAA 30 (L) 03/23/2018   Patient is on Vitamin D supplement. Lab Results  Component Value Date   VD25OH 68 03/23/2018     Patient is on allopurinol for gout and does not report a recent flare.  Lab Results  Component Value Date   LABURIC 4.9 03/23/2018     Medication Review Current Outpatient Medications on File Prior to Visit  Medication Sig Dispense Refill  . acetaminophen (TYLENOL) 325 MG tablet Take 325 mg by mouth every 6 (six) hours as needed for headache.    . allopurinol (ZYLOPRIM) 300 MG tablet allopurinol 300 mg tablet    . anastrozole (ARIMIDEX) 1 MG tablet Take 1 tablet (1 mg total) by mouth daily. 90 tablet 3  . aspirin 81 MG tablet Take 81 mg by mouth daily.    . Cholecalciferol (VITAMIN D3) 5000 units CAPS Take 5,000 Units by mouth daily.    . DULoxetine (CYMBALTA) 60 MG capsule TAKE 1 CAPSULE BY MOUTH DAILY FOR NEUROPATHY OR PAIN 90 capsule 0  . ezetimibe (ZETIA) 10 MG tablet TAKE 1 TABLET BY MOUTH DAILY FOR  CHOLESTEROL 90 tablet 0  . FREESTYLE TEST STRIPS test strip TEST BLOOD SUGAR LEVELS ONCE DAILY AS DIRECTED 100 each 0  . gabapentin (NEURONTIN) 100 MG capsule TAKE 1-3 CAPSULE(S) BY MOUTH EVERY NIGHT AT BEDTIME FOR NEURITIS 270 capsule 1  . hydrochlorothiazide (HYDRODIURIL) 25 MG tablet TAKE ONE TABLET BY MOUTH DAILY 90 tablet 1  . Lancets (FREESTYLE) lancets USE TO CHECK BLOOD SUGAR DAILY AS DIRECTED 100 each 3  . loratadine (CLARITIN) 10 MG tablet Take 10 mg by mouth daily as needed for allergies.    . Magnesium ([employer] MAGNESIUM) 250 MG TABS Take 500 mg by mouth 2 (two) times daily.    . metFORMIN (GLUCOPHAGE-XR) 500 MG 24 hr tablet TAKE 4 TABLETS BY MOUTH DAILY FOR BLOOD SUGAR 360 tablet 1  . metoprolol succinate (TOPROL-XL) 25 MG 24 hr tablet TAKE 1 TABLET(25 MG) BY MOUTH DAILY 90 tablet 0  . OVER THE COUNTER MEDICATION Takes Ranitidine 150 mg daily PRN.    . vitamin C (ASCORBIC ACID) 500 MG tablet Take 500 mg by mouth daily. Takes 2 tablets daily     Current Facility-Administered Medications on File Prior to Visit  Medication Dose Route Frequency Provider Last Rate Last Dose  . 0.9 %  sodium chloride infusion  500 mL Intravenous Once  Mauri Pole, MD        Current Problems (verified) Patient Active Problem List   Diagnosis Date Noted  . CKD stage 3 due to type 2 diabetes mellitus ([place]) 07/05/2018  . Port catheter in place 09/18/2016  . Chemotherapy-induced peripheral neuropathy ([place]) 08/07/2016  . Breast cancer of upper-inner quadrant of left female breast ([place]) 02/29/2016  . Gout 12/08/2014  . Diabetic neuropathy, painful ([place]) 08/28/2014  . Medication management 08/27/2014  . Essential hypertension 11/10/2013  . Hyperlipidemia 11/10/2013  . T2_NIDDM w/CKD3 (GFR 45 ml/min) 11/10/2013  . Vitamin D deficiency 11/10/2013    Screening Tests Immunization History  Administered Date(s) Administered  . DTaP 07/01/2004  . Influenza Whole 08/15/2013  . Influenza, High Dose  Seasonal PF 08/28/2014, 09/20/2015, 08/19/2017  . Pneumococcal Conjugate-13 09/20/2015  . Pneumococcal Polysaccharide-23 11/01/2009  . Td 08/10/2009  . Zoster 08/02/2011    Preventative care: Last colonoscopy: 2014 Last mammogram: 03/2018 - biopsy neg Last pap smear/pelvic exam: remotely DEXA: 01/2017  Prior vaccinations: TD or Tdap: 2010  Influenza: 2018  Pneumococcal: 2010 Prevnar13: 2016 Shingles/Zostavax: 2012  Names of Other Physician/Practitioners you currently use: 1. [facility]- here for primary care 2. Dr. Nicki Reaper, eye doctor, last visit 02/16/2018 - report verified and abstracted 3. Dr. Jolayne Panther, dentist, last visit 2019  Patient Care Team: [name], MD as PCP - Kinnie Scales, [place] as Referring Physician (Optometry) [name], MD as Consulting Physician (Dermatology) Latanya Maudlin, MD as Consulting Physician (Orthopedic Surgery) Inda Castle, MD (Inactive) as Consulting Physician (Gastroenterology) [name], MD as Consulting Physician (Hematology and Oncology)  Past Surgical History:  Procedure Laterality Date  . ABDOMINAL HYSTERECTOMY  1973  . BREAST BIOPSY Left 02/26/2016  . BREAST LUMPECTOMY Left 03/18/2016  . CARDIAC CATHETERIZATION     patient denies this procedure  . CATARACT EXTRACTION, BILATERAL  2014   right eye 2/14; left eye 3/3  . COLONOSCOPY    . HAND SURGERY Left 2012  . INCONTINENCE SURGERY  2006  . KNEE ARTHROSCOPY Right   . MASTOPEXY Right 06/16/2017   Procedure: RIGHT BREAST MASTOPEXY;  Surgeon: Irene Limbo, MD;  Location: [place];  Service: Plastics;  Laterality: Right;  . PORT-A-CATH REMOVAL N/A 11/27/2016   Procedure: REMOVAL PORT-A-CATH;  Surgeon: Fanny Skates, MD;  Location: [facility];  Service: General;  Laterality: N/A;  . PORTACATH PLACEMENT N/A 04/14/2016   Procedure: INSERTION PORT-A-CATH ;  Surgeon: Fanny Skates, MD;  Location: [place];  Service:  General;  Laterality: N/A;  . RADIOACTIVE SEED GUIDED PARTIAL MASTECTOMY WITH AXILLARY SENTINEL LYMPH NODE BIOPSY Left 03/18/2016   Procedure: RADIOACTIVE SEED GUIDED LEFT PARTIAL MASTECTOMY WITH AXILLARY SENTINEL LYMPH NODE BIOPSY AND BLUE DYE INJECTION;  Surgeon: Fanny Skates, MD;  Location: [place];  Service: General;  Laterality: Left;  . REDUCTION MAMMAPLASTY Right 2018   Family History  Problem Relation Age of Onset  . Stroke Mother 52  . Other Mother        history of hysterectomy after last childbirth  . Diabetes Father   . Alzheimer's disease Father   . Hypertension Sister   . Lung cancer Sister 75       smoker  . Other Sister        history of hysterectomy for fibroids  . Breast cancer Sister 34  . Esophageal cancer Maternal Aunt 74       not a smoker  . Heart attack Maternal Uncle 65  .  Cirrhosis Sister   . Lung cancer Other        nephew dx. 48s; +smoker  . Epilepsy Other   . Other Other 12       great niece dx. benign ganglioglioma brain tumor; [facility] at [facility]  . Epilepsy Other        no seizures in 2 years  . Parkinson's disease Maternal Aunt   . Stroke Maternal Grandmother   . Heart Problems Maternal Grandmother   . Pancreatic cancer Cousin 15       paternal 1st cousin  . Colon cancer Neg Hx   . Stomach cancer Neg Hx   . Colon polyps Neg Hx   . Ulcerative colitis Neg Hx    Social History   Tobacco Use  . Smoking status: Never Smoker  . Smokeless tobacco: Never Used  Substance Use Topics  . Alcohol use: No  . Drug use: No   Allergies  Allergen Reactions  . Keflex [Cephalexin] Swelling    Tongue and throat swells  . Meloxicam Swelling  . Prednisone Swelling    Can Not take High doses  . Premarin [Estrogens Conjugated] Hives  . Trovan [Alatrofloxacin] Other (See Comments)    Unknown reaction    MEDICARE WELLNESS OBJECTIVES: Tobacco use: She does not smoke.  Patient is not a former smoker. If yes, counseling given Alcohol Current alcohol use:  social drinker Diet: in general, a "healthy" diet   Physical activity:   Cardiac risk factors:   Depression/mood screen:   Depression screen PHQ 2/9 07/06/2018  Decreased Interest 0  Down, Depressed, Hopeless 0  PHQ - 2 Score 0    ADLs:  In your present state of health, do you have any difficulty performing the following activities: 03/23/2018 08/19/2017  Hearing? N N  Vision? N N  Difficulty concentrating or making decisions? N N  Walking or climbing stairs? N N  Dressing or bathing? N N  Doing errands, shopping? N N  Some recent data might be hidden     Cognitive Testing  Alert? Yes  Normal Appearance?Yes  Oriented to person? Yes  Place? Yes   Time? Yes  Recall of three objects?  Yes  Can perform simple calculations? Yes  Displays appropriate judgment?Yes  Can read the correct time from a watch face?Yes  EOL planning: Does Patient Have a Medical Advance Directive?: No Would patient like information on creating a medical advance directive?: No - Patient declined   Objective:   Today's Vitals   07/06/18 0928  BP: 122/74  Pulse: 70  Temp: (!) 97.2 F (36.2 C)  SpO2: 99%  Weight: 141 lb (64 kg)  Height: 5' 3.5" (1.613 m)   Body mass index is 24.59 kg/m.  General appearance: alert, no distress, WD/WN,  female HEENT: normocephalic, sclerae anicteric, TMs pearly, nares patent, no discharge or erythema, pharynx normal Oral cavity: MMM, no lesions Neck: supple, no lymphadenopathy, no thyromegaly, no masses Heart: RRR, normal S1, S2, no murmurs Lungs: CTA bilaterally, no wheezes, rhonchi, or rales Abdomen: +bs, soft, non tender, non distended, no masses, no hepatomegaly, no splenomegaly Musculoskeletal: nontender, no swelling, no obvious deformity Extremities: no edema, no cyanosis, no clubbing Pulses: 2+ symmetric, upper and lower extremities, normal cap refill Neurological: alert, oriented x 3, CN2-12 intact, strength normal upper extremities and lower extremities,  sensation normal throughout, DTRs 2+ throughout, no cerebellar signs, gait normal Psychiatric: normal affect, behavior normal, pleasant  Breast: Left breast with well healing incision to the superior left  breast with no redness, swelling or warmth. Tender to palpation.  Left axilla with well healing incision.  Dermabond peeling   Gyn: defer Rectal: defer   Medicare Attestation I have personally reviewed: The patient's medical and social history Their use of alcohol, tobacco or illicit drugs Their current medications and supplements The patient's functional ability including ADLs,fall risks, home safety risks, cognitive, and hearing and visual impairment Diet and physical activities Evidence for depression or mood disorders  The patient's weight, height, BMI, and visual acuity have been recorded in the chart.  I have made referrals, counseling, and provided education to the patient based on review of the above and I have provided the patient with a written personalized care plan for preventive services.     Izora Ribas, NP   07/06/2018

## 2018-07-06 ENCOUNTER — Encounter: Payer: Self-pay | Admitting: Adult Health

## 2018-07-06 ENCOUNTER — Ambulatory Visit (INDEPENDENT_AMBULATORY_CARE_PROVIDER_SITE_OTHER): Payer: PPO | Admitting: Adult Health

## 2018-07-06 VITALS — BP 122/74 | HR 70 | Temp 97.2°F | Ht 63.5 in | Wt 141.0 lb

## 2018-07-06 DIAGNOSIS — Z170 Estrogen receptor positive status [ER+]: Secondary | ICD-10-CM

## 2018-07-06 DIAGNOSIS — E559 Vitamin D deficiency, unspecified: Secondary | ICD-10-CM

## 2018-07-06 DIAGNOSIS — Z6824 Body mass index (BMI) 24.0-24.9, adult: Secondary | ICD-10-CM

## 2018-07-06 DIAGNOSIS — I10 Essential (primary) hypertension: Secondary | ICD-10-CM | POA: Diagnosis not present

## 2018-07-06 DIAGNOSIS — C50212 Malignant neoplasm of upper-inner quadrant of left female breast: Secondary | ICD-10-CM

## 2018-07-06 DIAGNOSIS — M1000 Idiopathic gout, unspecified site: Secondary | ICD-10-CM | POA: Diagnosis not present

## 2018-07-06 DIAGNOSIS — N183 Chronic kidney disease, stage 3 unspecified: Secondary | ICD-10-CM

## 2018-07-06 DIAGNOSIS — G620 Drug-induced polyneuropathy: Secondary | ICD-10-CM | POA: Diagnosis not present

## 2018-07-06 DIAGNOSIS — E782 Mixed hyperlipidemia: Secondary | ICD-10-CM | POA: Diagnosis not present

## 2018-07-06 DIAGNOSIS — Z79899 Other long term (current) drug therapy: Secondary | ICD-10-CM | POA: Diagnosis not present

## 2018-07-06 DIAGNOSIS — E1122 Type 2 diabetes mellitus with diabetic chronic kidney disease: Secondary | ICD-10-CM

## 2018-07-06 DIAGNOSIS — Z0000 Encounter for general adult medical examination without abnormal findings: Secondary | ICD-10-CM

## 2018-07-06 DIAGNOSIS — Z0001 Encounter for general adult medical examination with abnormal findings: Secondary | ICD-10-CM | POA: Diagnosis not present

## 2018-07-06 DIAGNOSIS — Z95828 Presence of other vascular implants and grafts: Secondary | ICD-10-CM | POA: Diagnosis not present

## 2018-07-06 DIAGNOSIS — E1140 Type 2 diabetes mellitus with diabetic neuropathy, unspecified: Secondary | ICD-10-CM

## 2018-07-06 DIAGNOSIS — R6889 Other general symptoms and signs: Secondary | ICD-10-CM | POA: Diagnosis not present

## 2018-07-06 DIAGNOSIS — T451X5A Adverse effect of antineoplastic and immunosuppressive drugs, initial encounter: Secondary | ICD-10-CM

## 2018-07-06 NOTE — Patient Instructions (Addendum)
Know what a healthy weight is for you (roughly BMI <25) and aim to maintain this  Aim for 7+ servings of fruits and vegetables daily  65-80+ fluid ounces of water or unsweet tea for healthy kidneys  Limit to max 1 drink of alcohol per day; avoid smoking/tobacco  Limit animal fats in diet for cholesterol and heart health - choose grass fed whenever available  Avoid highly processed foods, and foods high in saturated/trans fats  Aim for low stress - take time to unwind and care for your mental health  Aim for 150 min of moderate intensity exercise weekly for heart health, and weights twice weekly for bone health  Aim for 7-9 hours of sleep daily     Preventing High Cholesterol Cholesterol is a waxy, fat-like substance that your body needs in small amounts. Your liver makes all the cholesterol that your body needs. Having high cholesterol (hypercholesterolemia) increases your risk for heart disease and stroke. Extra (excess) cholesterol comes from the food you eat, such as animal-based fat (saturated fat) from meat and some dairy products. High cholesterol can often be prevented with diet and lifestyle changes. If you already have high cholesterol, you can control it with diet and lifestyle changes, as well as medicine. What nutrition changes can be made?  Eat less saturated fat. Foods that contain saturated fat include red meat and some dairy products.  Avoid processed meats, like bacon and lunch meats.  Avoid trans fats, which are found in margarine and some baked goods.  Avoid foods and beverages that have added sugars.  Eat more fruits, vegetables, and whole grains.  Choose healthy sources of protein, such as fish, poultry, and nuts.  Choose healthy sources of fat, such as: ? Nuts. ? Vegetable oils, especially olive oil. ? Fish that have healthy fats (omega-3 fatty acids), such as mackerel or salmon. What lifestyle changes can be made?  Lose weight if you are overweight.  Losing 5-10 lb (2.3-4.5 kg) can help prevent or control high cholesterol and reduce your risk for diabetes and high blood pressure. Ask your health care provider to help you with a diet and exercise plan to safely lose weight.  Get enough exercise. Do at least 150 minutes of moderate-intensity exercise each week. ? You could do this in short exercise sessions several times a day, or you could do longer exercise sessions a few times a week. For example, you could take a brisk 10-minute walk or bike ride, 3 times a day, for 5 days a week.  Do not smoke. If you need help quitting, ask your health care provider.  Limit your alcohol intake. If you drink alcohol, limit alcohol intake to no more than 1 drink a day for nonpregnant women and 2 drinks a day for men. One drink equals 12 oz of beer, 5 oz of wine, or 1 oz of hard liquor. Why are these changes important? If you have high cholesterol, deposits (plaques) may build up on the walls of your blood vessels. Plaques make the arteries narrower and stiffer, which can restrict or block blood flow and cause blood clots to form. This greatly increases your risk for heart attack and stroke. Making diet and lifestyle changes can reduce your risk for these life-threatening conditions. What can I do to lower my risk?  Manage your risk factors for high cholesterol. Talk with your health care provider about all of your risk factors and how to lower your risk.  Manage other conditions that you have,  such as diabetes or high blood pressure (hypertension).  Have your cholesterol checked at regular intervals.  Keep all follow-up visits as told by your health care provider. This is important. How is this treated? In addition to diet and lifestyle changes, your health care provider may recommend medicines to help lower cholesterol, such as a medicine to reduce the amount of cholesterol made in your liver. You may need medicine if:  Diet and lifestyle changes do not  lower your cholesterol enough.  You have high cholesterol and other risk factors for heart disease or stroke.  Take over-the-counter and prescription medicines only as told by your health care provider. Where to find more information:  American Heart Association: ThisTune.com.pt.jsp  National Heart, Lung, and Blood Institute: FrenchToiletries.com.cy Summary  High cholesterol increases your risk for heart disease and stroke. By keeping your cholesterol level low, you can reduce your risk for these conditions.  Diet and lifestyle changes are the most important steps in preventing high cholesterol.  Work with your health care provider to manage your risk factors, and have your blood tested regularly. This information is not intended to replace advice given to you by your health care provider. Make sure you discuss any questions you have with your health care provider. Document Released: 12/02/2015 Document Revised: 07/26/2016 Document Reviewed: 07/26/2016 Elsevier Interactive Patient Education  [employer].

## 2018-07-07 LAB — CBC WITH DIFFERENTIAL/PLATELET
BASOS ABS: 31 {cells}/uL (ref 0–200)
Basophils Relative: 0.6 %
EOS ABS: 109 {cells}/uL (ref 15–500)
EOS PCT: 2.1 %
HEMATOCRIT: 36.7 % (ref 35.0–45.0)
HEMOGLOBIN: 12.4 g/dL (ref 11.7–15.5)
Lymphs Abs: 1628 cells/uL (ref 850–3900)
MCH: 29.8 pg (ref 27.0–33.0)
MCHC: 33.8 g/dL (ref 32.0–36.0)
MCV: 88.2 fL (ref 80.0–100.0)
MONOS PCT: 7.9 %
MPV: 10.2 fL (ref 7.5–12.5)
NEUTROS PCT: 58.1 %
Neutro Abs: 3021 cells/uL (ref 1500–7800)
Platelets: 165 10*3/uL (ref 140–400)
RBC: 4.16 10*6/uL (ref 3.80–5.10)
RDW: 13.7 % (ref 11.0–15.0)
Total Lymphocyte: 31.3 %
WBC mixed population: 411 cells/uL (ref 200–950)
WBC: 5.2 10*3/uL (ref 3.8–10.8)

## 2018-07-07 LAB — COMPLETE METABOLIC PANEL WITH GFR
AG Ratio: 2.1 (calc) (ref 1.0–2.5)
ALBUMIN MSPROF: 4.4 g/dL (ref 3.6–5.1)
ALKALINE PHOSPHATASE (APISO): 83 U/L (ref 33–130)
ALT: 13 U/L (ref 6–29)
AST: 18 U/L (ref 10–35)
BILIRUBIN TOTAL: 0.6 mg/dL (ref 0.2–1.2)
BUN / CREAT RATIO: 21 (calc) (ref 6–22)
BUN: 24 mg/dL (ref 7–25)
CHLORIDE: 101 mmol/L (ref 98–110)
CO2: 28 mmol/L (ref 20–32)
CREATININE: 1.16 mg/dL — AB (ref 0.60–0.88)
Calcium: 10.2 mg/dL (ref 8.6–10.4)
GFR, Est African American: 51 mL/min/{1.73_m2} — ABNORMAL LOW (ref >=60)
GFR, Est Non African American: 44 mL/min/{1.73_m2} — ABNORMAL LOW (ref >=60)
GLOBULIN: 2.1 g/dL (ref 1.9–3.7)
Glucose, Bld: 116 mg/dL — ABNORMAL HIGH (ref 65–99)
POTASSIUM: 4.4 mmol/L (ref 3.5–5.3)
SODIUM: 140 mmol/L (ref 135–146)
Total Protein: 6.5 g/dL (ref 6.1–8.1)

## 2018-07-07 LAB — HEMOGLOBIN A1C
Hgb A1c MFr Bld: 6.1 % of total Hgb — ABNORMAL HIGH (ref <5.7)
MEAN PLASMA GLUCOSE: 128 (calc)
eAG (mmol/L): 7.1 (calc)

## 2018-07-07 LAB — LIPID PANEL
Cholesterol: 207 mg/dL — ABNORMAL HIGH (ref <200)
HDL: 47 mg/dL — ABNORMAL LOW (ref >50)
LDL CHOLESTEROL (CALC): 135 mg/dL — AB
Non-HDL Cholesterol (Calc): 160 mg/dL (calc) — ABNORMAL HIGH (ref <130)
Total CHOL/HDL Ratio: 4.4 (calc) (ref <5.0)
Triglycerides: 128 mg/dL (ref <150)

## 2018-07-07 LAB — TSH: TSH: 2.57 m[IU]/L (ref 0.40–4.50)

## 2018-07-07 LAB — MAGNESIUM: MAGNESIUM: 1.4 mg/dL — AB (ref 1.5–2.5)

## 2018-07-13 DIAGNOSIS — L814 Other melanin hyperpigmentation: Secondary | ICD-10-CM | POA: Diagnosis not present

## 2018-07-13 DIAGNOSIS — D225 Melanocytic nevi of trunk: Secondary | ICD-10-CM | POA: Diagnosis not present

## 2018-07-13 DIAGNOSIS — Z85828 Personal history of other malignant neoplasm of skin: Secondary | ICD-10-CM | POA: Diagnosis not present

## 2018-07-13 DIAGNOSIS — D1801 Hemangioma of skin and subcutaneous tissue: Secondary | ICD-10-CM | POA: Diagnosis not present

## 2018-07-13 DIAGNOSIS — L821 Other seborrheic keratosis: Secondary | ICD-10-CM | POA: Diagnosis not present

## 2018-08-13 ENCOUNTER — Other Ambulatory Visit: Payer: Self-pay | Admitting: Adult Health

## 2018-08-13 DIAGNOSIS — E782 Mixed hyperlipidemia: Secondary | ICD-10-CM

## 2018-08-14 ENCOUNTER — Other Ambulatory Visit: Payer: Self-pay | Admitting: Adult Health

## 2018-09-04 ENCOUNTER — Other Ambulatory Visit: Payer: Self-pay | Admitting: Internal Medicine

## 2018-09-07 ENCOUNTER — Other Ambulatory Visit: Payer: Self-pay | Admitting: Internal Medicine

## 2018-09-07 DIAGNOSIS — E1140 Type 2 diabetes mellitus with diabetic neuropathy, unspecified: Secondary | ICD-10-CM

## 2018-09-08 NOTE — Assessment & Plan Note (Signed)
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 Adjuvant chemotherapy with dose dense Adriamycin and Cytoxan 4 followed by Abraxane weekly 8 ( stopped due to neuropathy) 04/17/2016 to 07/31/2016 Adj XRT from 10/2/17to 09/26/16  Chemo induced neuropathy: Being monitored Current treatment: Anti estrogen therapy with Letrozole 2.5 mg daily started January2018  Letrozole toxicities: 1. Severe hot flashes: Patient is currently on gabapentin along with Cymbalta.  Surveillance: 1. Breast exam 09/08/2018: Benign 2. mammogram 03/01/18: Postsurgical changes  Return to clinic in 1 year

## 2018-09-09 ENCOUNTER — Inpatient Hospital Stay: Payer: PPO | Attending: Hematology and Oncology | Admitting: Hematology and Oncology

## 2018-09-09 DIAGNOSIS — Z923 Personal history of irradiation: Secondary | ICD-10-CM | POA: Diagnosis not present

## 2018-09-09 DIAGNOSIS — C50212 Malignant neoplasm of upper-inner quadrant of left female breast: Secondary | ICD-10-CM | POA: Insufficient documentation

## 2018-09-09 DIAGNOSIS — Z7982 Long term (current) use of aspirin: Secondary | ICD-10-CM | POA: Diagnosis not present

## 2018-09-09 DIAGNOSIS — Z170 Estrogen receptor positive status [ER+]: Secondary | ICD-10-CM | POA: Diagnosis not present

## 2018-09-09 DIAGNOSIS — Z9221 Personal history of antineoplastic chemotherapy: Secondary | ICD-10-CM | POA: Insufficient documentation

## 2018-09-09 DIAGNOSIS — Z79899 Other long term (current) drug therapy: Secondary | ICD-10-CM | POA: Diagnosis not present

## 2018-09-09 DIAGNOSIS — Z79811 Long term (current) use of aromatase inhibitors: Secondary | ICD-10-CM | POA: Diagnosis not present

## 2018-09-09 MED ORDER — ANASTROZOLE 1 MG PO TABS: 1.0000 mg | ORAL_TABLET | Freq: Every day | ORAL | 3 refills | Status: DC

## 2018-09-09 NOTE — Progress Notes (Signed)
Patient Care Team: [name], MD as PCP - Kinnie Scales, [place] as Referring Physician (Optometry) [name], MD as Consulting Physician (Dermatology) Latanya Maudlin, MD as Consulting Physician (Orthopedic Surgery) Inda Castle, MD (Inactive) as Consulting Physician (Gastroenterology) [name], MD as Consulting Physician (Hematology and Oncology)  DIAGNOSIS:  Encounter Diagnosis  Name Primary?  . Malignant neoplasm of upper-inner quadrant of left breast in female, estrogen receptor positive ([place])     SUMMARY OF ONCOLOGIC HISTORY:   Breast cancer of upper-inner quadrant of left female breast ([facility])   02/26/2016 Initial Diagnosis    Left breast biopsy 11:00 position: invasive ductal carcinoma with DCIS, ER 90%, PR 10%, HER-2 negative, Ki-67 30%, grade 2, 2.2 cm palpable lesion T2 N0 stage II a clinical stage    03/18/2016 Surgery    Left lumpectomy: Invasive ductal carcinoma, grade 2, 6.3 cm, with high-grade DCIS, margins negative, 0/4 lymph nodes negative, ER 90%, via 10%, HER-2 negative ratio 0.97, Ki-67 30%, T3 N0 stage IIB    03/25/2016 Procedure    Genetic testing is negative for pathogenic mutations within any of the 20 Genes on the breast/ovarian cancer panel    04/04/2016 Oncotype testing    Oncotype DX recurrence score 37, 25% 10 year distant risk of recurrence    04/17/2016 - 07/31/2016 Chemotherapy    Adjuvant chemotherapy with dose dense Adriamycin and Cytoxan followed by Abraxane weekly 8 ( discontinued due to neuropathy)    09/01/2016 - 09/26/2016 Radiation Therapy    Adj XRT 1) Left breast: 42.5 Gy in 17 fractions. 2) Left breast boost: 7.5 Gy in 3 fractions.    12/02/2016 -  Anti-estrogen oral therapy    Anastrozole 1 mg daily     CHIEF COMPLIANT: Follow-up on letrozole  INTERVAL HISTORY: Kayla Baker is a 82 year old with above-mentioned history of left breast cancer treated with lumpectomy followed by adjuvant chemotherapy and radiation is  currently on anastrozole therapy.  Her major complaints are related to neuropathy in the feet which are still not better in spite of gabapentin and Cymbalta.  She is also complaining of fatigue.  She does have increased hot flashes as well.  She felt a small nodule in the left axilla along the surgical scar.  REVIEW OF SYSTEMS:   Constitutional: Denies fevers, chills or abnormal weight loss Eyes: Denies blurriness of vision Ears, nose, mouth, throat, and face: Denies mucositis or sore throat Respiratory: Denies cough, dyspnea or wheezes Cardiovascular: Denies palpitation, chest discomfort Gastrointestinal:  Denies nausea, heartburn or change in bowel habits Skin: Denies abnormal skin rashes Lymphatics: Denies new lymphadenopathy or easy bruising Neurological:Denies numbness, tingling or new weaknesses Behavioral/Psych: Mood is stable, no new changes  Extremities: No lower extremity edema Breast: Left axillary nodule All other systems were reviewed with the patient and are negative.  I have reviewed the past medical history, past surgical history, social history and family history with the patient and they are unchanged from previous note.  ALLERGIES:  is allergic to keflex [cephalexin]; meloxicam; prednisone; premarin [estrogens conjugated]; and trovan [alatrofloxacin].  MEDICATIONS:  Current Outpatient Medications  Medication Sig Dispense Refill  . acetaminophen (TYLENOL) 325 MG tablet Take 325 mg by mouth every 6 (six) hours as needed for headache.    . allopurinol (ZYLOPRIM) 300 MG tablet TAKE 1 TABLET(300 MG) BY MOUTH DAILY 90 tablet 1  . anastrozole (ARIMIDEX) 1 MG tablet Take 1 tablet (1 mg total) by mouth daily. 90 tablet 3  . aspirin 81 MG  tablet Take 81 mg by mouth daily.    . Cholecalciferol (VITAMIN D3) 5000 units CAPS Take 5,000 Units by mouth daily.    . DULoxetine (CYMBALTA) 60 MG capsule TAKE 1 CAPSULE BY MOUTH DAILY FOR NEUROPATHY OR PAIN 90 capsule 3  . ezetimibe  (ZETIA) 10 MG tablet TAKE 1 TABLET BY MOUTH DAILY FOR CHOLESTEROL 90 tablet 0  . FREESTYLE TEST STRIPS test strip TEST BLOOD SUGAR ONCE DAILY AS DIRECTED 100 each 3  . gabapentin (NEURONTIN) 100 MG capsule TAKE 1-3 CAPSULE(S) BY MOUTH EVERY NIGHT AT BEDTIME FOR NEURITIS 270 capsule 1  . hydrochlorothiazide (HYDRODIURIL) 25 MG tablet TAKE ONE TABLET BY MOUTH DAILY 90 tablet 1  . Lancets (FREESTYLE) lancets USE TO CHECK BLOOD SUGAR DAILY AS DIRECTED 100 each 3  . loratadine (CLARITIN) 10 MG tablet Take 10 mg by mouth daily as needed for allergies.    . Magnesium ([employer] MAGNESIUM) 250 MG TABS Take 500 mg by mouth 2 (two) times daily.    . metFORMIN (GLUCOPHAGE-XR) 500 MG 24 hr tablet TAKE 4 TABLETS BY MOUTH DAILY FOR BLOOD SUGAR 360 tablet 1  . metoprolol succinate (TOPROL-XL) 25 MG 24 hr tablet TAKE 1 TABLET(25 MG) BY MOUTH DAILY 90 tablet 0  . OVER THE COUNTER MEDICATION Takes Ranitidine 150 mg daily PRN.    . vitamin C (ASCORBIC ACID) 500 MG tablet Take 500 mg by mouth daily. Takes 2 tablets daily     Current Facility-Administered Medications  Medication Dose Route Frequency Provider Last Rate Last Dose  . 0.9 %  sodium chloride infusion  500 mL Intravenous Once Nandigam, Venia Minks, MD        PHYSICAL EXAMINATION: ECOG PERFORMANCE STATUS: 1 - Symptomatic but completely ambulatory  Vitals:   09/09/18 0841  BP: 131/68  Pulse: 77  Resp: 20  Temp: 97.8 F (36.6 C)  SpO2: 98%   Filed Weights   09/09/18 0841  Weight: 143 lb 4.8 oz (65 kg)    GENERAL:alert, no distress and comfortable SKIN: skin color, texture, turgor are normal, no rashes or significant lesions EYES: normal, Conjunctiva are pink and non-injected, sclera clear OROPHARYNX:no exudate, no erythema and lips, buccal mucosa, and tongue normal  NECK: supple, thyroid normal size, non-tender, without nodularity LYMPH:  no palpable lymphadenopathy in the cervical, axillary or inguinal LUNGS: clear to auscultation and percussion  with normal breathing effort HEART: regular rate & rhythm and no murmurs and no lower extremity edema ABDOMEN:abdomen soft, non-tender and normal bowel sounds MUSCULOSKELETAL:no cyanosis of digits and no clubbing  NEURO: alert & oriented x 3 with fluent speech, no focal motor/sensory deficits EXTREMITIES: No lower extremity edema BREAST: Left axillary nodule is along the surgical scar and does not feel like a lymph node. No palpable axillary supraclavicular or infraclavicular adenopathy no breast tenderness or nipple discharge. (exam performed in the presence of a chaperone)  LABORATORY DATA:  I have reviewed the data as listed CMP Latest Ref Rng & Units 07/06/2018 03/23/2018 11/26/2017  Glucose 65 - 99 mg/dL 116(H) 102(H) 113(H)  BUN 7 - 25 mg/dL 24 32(H) 23  Creatinine 0.60 - 0.88 mg/dL 1.16(H) 1.63(H) 1.23(H)  Sodium 135 - 146 mmol/L 140 141 143  Potassium 3.5 - 5.3 mmol/L 4.4 3.9 4.1  Chloride 98 - 110 mmol/L 101 100 101  CO2 20 - 32 mmol/L 28 32 34(H)  Calcium 8.6 - 10.4 mg/dL 10.2 10.9(H) 10.7(H)  Total Protein 6.1 - 8.1 g/dL 6.5 7.5 6.7  Total Bilirubin 0.2 -  1.2 mg/dL 0.6 0.7 0.8  Alkaline Phos 33 - 130 U/L - - -  AST 10 - 35 U/L 18 22 28   ALT 6 - 29 U/L 13 15 25     Lab Results  Component Value Date   WBC 5.2 07/06/2018   HGB 12.4 07/06/2018   HCT 36.7 07/06/2018   MCV 88.2 07/06/2018   PLT 165 07/06/2018   NEUTROABS 3,021 07/06/2018    ASSESSMENT & PLAN:  Breast cancer of upper-inner quadrant of left female breast ([place]) Left lumpectomy 03/08/2016: Invasive ductal carcinoma, grade 2, 6.3 cm, with high-grade DCIS, margins negative, 0/4 lymph nodes negative, ER 90%, via 10%, HER-2 negative ratio 0.97, Ki-67 30%, T3 N0 stage IIB  Oncotype DX: Patient has a score of 37 which translates into 25% 10 year risk of distant recurrence with tamoxifen alone. Adjuvant chemotherapy with dose dense Adriamycin and Cytoxan 4 followed by Abraxane weekly 8 ( stopped due to neuropathy)  04/17/2016 to 07/31/2016 Adj XRT from 10/2/17to 09/26/16  Chemo induced neuropathy: Being monitored Current treatment: Anti estrogen therapy with Letrozole 2.5 mg daily started January2018  Letrozole toxicities: 1. Severe hot flashes: Patient is currently on gabapentin along with Cymbalta.  Surveillance: 1. Breast exam 09/08/2018: Benign 2. mammogram  03/01/18: Postsurgical changes  Return to clinic in 1 year   No orders of the defined types were placed in this encounter.  The patient has a good understanding of the overall plan. she agrees with it. she will call with any problems that may develop before the next visit here.   Harriette Ohara, MD 09/09/18

## 2018-09-10 ENCOUNTER — Telehealth: Payer: Self-pay | Admitting: Hematology and Oncology

## 2018-09-10 NOTE — Telephone Encounter (Signed)
Mailed pt calendar of appts

## 2018-09-19 ENCOUNTER — Other Ambulatory Visit: Payer: Self-pay | Admitting: Adult Health

## 2018-10-14 ENCOUNTER — Ambulatory Visit (INDEPENDENT_AMBULATORY_CARE_PROVIDER_SITE_OTHER): Payer: PPO | Admitting: Internal Medicine

## 2018-10-14 ENCOUNTER — Encounter: Payer: Self-pay | Admitting: Internal Medicine

## 2018-10-14 VITALS — BP 130/68 | HR 82 | Temp 97.8°F | Resp 14 | Ht 63.5 in | Wt 141.6 lb

## 2018-10-14 DIAGNOSIS — M1000 Idiopathic gout, unspecified site: Secondary | ICD-10-CM

## 2018-10-14 DIAGNOSIS — I10 Essential (primary) hypertension: Secondary | ICD-10-CM

## 2018-10-14 DIAGNOSIS — E782 Mixed hyperlipidemia: Secondary | ICD-10-CM | POA: Diagnosis not present

## 2018-10-14 DIAGNOSIS — N183 Chronic kidney disease, stage 3 unspecified: Secondary | ICD-10-CM

## 2018-10-14 DIAGNOSIS — E1122 Type 2 diabetes mellitus with diabetic chronic kidney disease: Secondary | ICD-10-CM

## 2018-10-14 DIAGNOSIS — Z853 Personal history of malignant neoplasm of breast: Secondary | ICD-10-CM

## 2018-10-14 DIAGNOSIS — Z79899 Other long term (current) drug therapy: Secondary | ICD-10-CM

## 2018-10-14 DIAGNOSIS — E559 Vitamin D deficiency, unspecified: Secondary | ICD-10-CM | POA: Diagnosis not present

## 2018-10-14 DIAGNOSIS — K219 Gastro-esophageal reflux disease without esophagitis: Secondary | ICD-10-CM | POA: Diagnosis not present

## 2018-10-14 DIAGNOSIS — E1140 Type 2 diabetes mellitus with diabetic neuropathy, unspecified: Secondary | ICD-10-CM | POA: Diagnosis not present

## 2018-10-14 NOTE — Patient Instructions (Signed)

## 2018-10-14 NOTE — Progress Notes (Signed)
This very nice 82 y.o. MWF presents for 6 month follow up with HTN, HLD, T2_DM and Vitamin D Deficiency. Patient's Gout & GERD is controlled with her meds.      , Patient underwent partial Lt Mastectomy in Apr 2017 for triple (+) Lt Breast Ca completing Chemo May-Aug 2017 and Radiation in Oct 2017. She started  Anastrozole Jan 2018 and continues close f/u w/Dr Lindi Adie.  In April, 2019, she had a neg Bx of the Rt Breast.      Patient is treated for HTN (2000)  & BP has been controlled at home. Today's BP is at goal - 130/68. Patient has had no complaints of any cardiac type chest pain, palpitations, dyspnea / orthopnea / PND, dizziness, claudication, or dependent edema.     Hyperlipidemia is not controlled with diet & meds. Patient denies myalgias or other med SE's. Last Lipids were not at goal: Lab Results  Component Value Date   CHOL 234 (H) 10/14/2018   HDL 56 10/14/2018   LDLCALC 151 (H) 10/14/2018   TRIG 143 10/14/2018   CHOLHDL 4.2 10/14/2018      Also, the patient has history of T2_NIDDM (2010) w/CKD3 (GFR 42) and has had no symptoms of reactive hypoglycemia, diabetic polysor visual blurring, but she does take Duloxetine & Gabapentin for   painful neuropathy.  Last A1c was not at goal: Lab Results  Component Value Date   HGBA1C 6.1 (H) 10/14/2018      Further, the patient also has history of Vitamin D Deficiency ("33" / 2008) and supplements vitamin D without any suspected side-effects. Last vitamin D was at goal: Lab Results  Component Value Date   VD25OH 98 10/14/2018   Current Outpatient Medications on File Prior to Visit  Medication Sig  . acetaminophen (TYLENOL) 325 MG tablet Take 325 mg by mouth every 6 (six) hours as needed for headache.  . allopurinol (ZYLOPRIM) 300 MG tablet TAKE 1 TABLET(300 MG) BY MOUTH DAILY  . anastrozole (ARIMIDEX) 1 MG tablet Take 1 tablet (1 mg total) by mouth daily.  Marland Kitchen aspirin 81 MG tablet Take 81 mg by mouth daily.  . Cholecalciferol  (VITAMIN D3) 5000 units CAPS Take 5,000 Units by mouth daily.  . DULoxetine (CYMBALTA) 60 MG capsule TAKE 1 CAPSULE BY MOUTH DAILY FOR NEUROPATHY OR PAIN  . ezetimibe (ZETIA) 10 MG tablet TAKE 1 TABLET BY MOUTH DAILY FOR CHOLESTEROL  . FREESTYLE TEST STRIPS test strip TEST BLOOD SUGAR ONCE DAILY AS DIRECTED  . gabapentin (NEURONTIN) 100 MG capsule TAKE 1-3 CAPSULE(S) BY MOUTH EVERY NIGHT AT BEDTIME FOR NEURITIS  . hydrochlorothiazide (HYDRODIURIL) 25 MG tablet TAKE ONE TABLET BY MOUTH DAILY  . Lancets (FREESTYLE) lancets USE TO CHECK BLOOD SUGAR DAILY AS DIRECTED  . loratadine (CLARITIN) 10 MG tablet Take 10 mg by mouth daily as needed for allergies.  . Magnesium ([employer] MAGNESIUM) 250 MG TABS Take 500 mg by mouth 2 (two) times daily.  . metFORMIN (GLUCOPHAGE-XR) 500 MG 24 hr tablet TAKE 4 TABLETS BY MOUTH DAILY FOR BLOOD SUGAR  . metoprolol succinate (TOPROL-XL) 25 MG 24 hr tablet TAKE 1 TABLET(25 MG) BY MOUTH DAILY  . vitamin C (ASCORBIC ACID) 500 MG tablet Take 500 mg by mouth daily. Takes 2 tablets daily   No current facility-administered medications on file prior to visit.    Allergies  Allergen Reactions  . Keflex [Cephalexin] Swelling    Tongue and throat swells  . Meloxicam Swelling  . Prednisone  Swelling    Can Not take High doses  . Premarin [Estrogens Conjugated] Hives  . Trovan [Alatrofloxacin] Other (See Comments)    Unknown reaction   PMHx:   Past Medical History:  Diagnosis Date  . Allergy   . Anemia   . Arthritis   . Blood transfusion without reported diagnosis   . Breast cancer of upper-inner quadrant of left female breast ([place]) 02/29/2016   skin- 2016- squamous- on right upper arm  . Diet-controlled type 2 diabetes mellitus ([place])    diet and exercsise,Has never been on meds, type II   . GERD (gastroesophageal reflux disease)   . Gout    takes Allopurinol daily  . History of chemotherapy   . History of colon polyps    benign  . History of shingles   . Hx of  radiation therapy   . Hyperlipidemia   . Hypertension   . Personal history of chemotherapy    2017  . Personal history of radiation therapy    2017  . Vitamin D deficiency    Immunization History  Administered Date(s) Administered  . DTaP 07/01/2004  . Influenza Whole 08/15/2013  . Influenza, High Dose Seasonal PF 08/28/2014, 09/20/2015, 08/19/2017  . Pneumococcal Conjugate-13 09/20/2015  . Pneumococcal Polysaccharide-23 11/01/2009  . Td 08/10/2009  . Zoster 08/02/2011   Past Surgical History:  Procedure Laterality Date  . ABDOMINAL HYSTERECTOMY  1973  . BREAST BIOPSY Left 02/26/2016  . BREAST LUMPECTOMY Left 03/18/2016  . CARDIAC CATHETERIZATION     patient denies this procedure  . CATARACT EXTRACTION, BILATERAL  2014   right eye 2/14; left eye 3/3  . COLONOSCOPY    . HAND SURGERY Left 2012  . INCONTINENCE SURGERY  2006  . KNEE ARTHROSCOPY Right   . MASTOPEXY Right 06/16/2017   Procedure: RIGHT BREAST MASTOPEXY;  Surgeon: Irene Limbo, MD;  Location: [facility];  Service: Plastics;  Laterality: Right;  . PORT-A-CATH REMOVAL N/A 11/27/2016   Procedure: REMOVAL PORT-A-CATH;  Surgeon: Fanny Skates, MD;  Location: [facility];  Service: General;  Laterality: N/A;  . PORTACATH PLACEMENT N/A 04/14/2016   Procedure: INSERTION PORT-A-CATH ;  Surgeon: Fanny Skates, MD;  Location: [place] [facility];  Service: General;  Laterality: N/A;  . RADIOACTIVE SEED GUIDED PARTIAL MASTECTOMY WITH AXILLARY SENTINEL LYMPH NODE BIOPSY Left 03/18/2016   Procedure: RADIOACTIVE SEED GUIDED LEFT PARTIAL MASTECTOMY WITH AXILLARY SENTINEL LYMPH NODE BIOPSY AND BLUE DYE INJECTION;  Surgeon: Fanny Skates, MD;  Location: [facility];  Service: General;  Laterality: Left;  . REDUCTION MAMMAPLASTY Right 2018   FHx:    Reviewed / unchanged  SHx:    Reviewed / unchanged   Systems Review:  Constitutional: Denies fever, chills, wt changes, headaches, insomnia, fatigue, night sweats, change in  appetite. Eyes: Denies redness, blurred vision, diplopia, discharge, itchy, watery eyes.  ENT: Denies discharge, congestion, post nasal drip, epistaxis, sore throat, earache, hearing loss, dental pain, tinnitus, vertigo, sinus pain, snoring.  CV: Denies chest pain, palpitations, irregular heartbeat, syncope, dyspnea, diaphoresis, orthopnea, PND, claudication or edema. Respiratory: denies cough, dyspnea, DOE, pleurisy, hoarseness, laryngitis, wheezing.  Gastrointestinal: Denies dysphagia, odynophagia, heartburn, reflux, water brash, abdominal pain or cramps, nausea, vomiting, bloating, diarrhea, constipation, hematemesis, melena, hematochezia  or hemorrhoids. Genitourinary: Denies dysuria, frequency, urgency, nocturia, hesitancy, discharge, hematuria or flank pain. Musculoskeletal: Denies arthralgias, myalgias, stiffness, jt. swelling, pain, limping or strain/sprain.  Skin: Denies pruritus, rash, hives, warts, acne, eczema or change in skin lesion(s). Neuro: No weakness, tremor, incoordination, spasms,  paresthesia or pain. Psychiatric: Denies confusion, memory loss or sensory loss. Endo: Denies change in weight, skin or hair change.  Heme/Lymph: No excessive bleeding, bruising or enlarged lymph nodes.  Physical Exam  BP 130/68   Pulse 82   Temp 97.8 F (36.6 C)   Resp 14   Ht 5' 3.5" (1.613 m)   Wt 141 lb 9.6 oz (64.2 kg)   SpO2 97%   BMI 24.69 kg/m   Appears  well nourished, well groomed  and in no distress.  Eyes: PERRLA, EOMs, conjunctiva no swelling or erythema. Sinuses: No frontal/maxillary tenderness ENT/Mouth: EAC's clear, TM's nl w/o erythema, bulging. Nares clear w/o erythema, swelling, exudates. Oropharynx clear without erythema or exudates. Oral hygiene is good. Tongue normal, non obstructing. Hearing intact.  Neck: Supple. Thyroid not palpable. Car 2+/2+ without bruits, nodes or JVD. Chest: Respirations nl with BS clear & equal w/o rales, rhonchi, wheezing or stridor.   Cor: Heart sounds normal w/ regular rate and rhythm without sig. murmurs, gallops, clicks or rubs. Peripheral pulses normal and equal  without edema.  Abdomen: Soft & bowel sounds normal. Non-tender w/o guarding, rebound, hernias, masses or organomegaly.  Lymphatics: Unremarkable.  Musculoskeletal: Full ROM all peripheral extremities, joint stability, 5/5 strength and normal gait.  Skin: Warm, dry without exposed rashes, lesions or ecchymosis apparent.  Neuro: Cranial nerves intact, reflexes equal bilaterally. Sensory-motor testing grossly intact. Tendon reflexes grossly intact.  Pysch: Alert & oriented x 3.  Insight and judgement nl & appropriate. No ideations.  Assessment and Plan:  1. Essential hypertension  - Continue medication, monitor blood pressure at home.  - Continue DASH diet.  Reminder to go to the ER if any CP,  SOB, nausea, dizziness, severe HA, changes vision/speech.  - CBC with Differential/Platelet - COMPLETE METABOLIC PANEL WITH GFR - Magnesium - TSH  2. Hyperlipidemia, mixed  - Continue diet/meds, exercise,& lifestyle modifications.  -  Continue monitor periodic cholesterol/liver & renal functions   - Lipid panel - TSH  3. Type 2 diabetes mellitus with stage 3 chronic kidney disease, without long-term current use of insulin (HCC)  - Continue diet, exercise,  - lifestyle modifications.  - Monitor appropriate labs.  - Hemoglobin A1c - Insulin, random  4. Vitamin D deficiency  - Continue supplementation.  - VITAMIN D 25 Hydroxyl  5. Idiopathic gout  - Uric acid  6. Gastroesophageal reflux disease  - CBC with Differential/Platelet  7. Diabetic neuropathy, painful ([facility])  - Hemoglobin A1c - Insulin, random  8. History of breast cancer   9. Medication management  - CBC with Differential/Platelet - COMPLETE METABOLIC PANEL WITH GFR - Magnesium - Lipid panel - TSH - Hemoglobin A1c - Insulin, random - VITAMIN D 25 Hydroxyl - Uric  acid       Discussed  regular exercise, BP monitoring, weight control to achieve/maintain BMI less than 25 and discussed med and SE's. Recommended labs to assess and monitor clinical status with further disposition pending results of labs. Over 30 minutes of exam, counseling, chart review was performed.

## 2018-10-15 LAB — CBC WITH DIFFERENTIAL/PLATELET
Basophils Absolute: 58 cells/uL (ref 0–200)
Basophils Relative: 1.1 %
EOS ABS: 138 {cells}/uL (ref 15–500)
Eosinophils Relative: 2.6 %
HCT: 39.7 % (ref 35.0–45.0)
Hemoglobin: 13.3 g/dL (ref 11.7–15.5)
LYMPHS ABS: 1648 {cells}/uL (ref 850–3900)
MCH: 29.5 pg (ref 27.0–33.0)
MCHC: 33.5 g/dL (ref 32.0–36.0)
MCV: 88 fL (ref 80.0–100.0)
MPV: 10 fL (ref 7.5–12.5)
Monocytes Relative: 9.2 %
Neutro Abs: 2968 cells/uL (ref 1500–7800)
Neutrophils Relative %: 56 %
Platelets: 212 10*3/uL (ref 140–400)
RBC: 4.51 10*6/uL (ref 3.80–5.10)
RDW: 13.5 % (ref 11.0–15.0)
TOTAL LYMPHOCYTE: 31.1 %
WBC: 5.3 10*3/uL (ref 3.8–10.8)
WBCMIX: 488 {cells}/uL (ref 200–950)

## 2018-10-15 LAB — COMPLETE METABOLIC PANEL WITH GFR
AG Ratio: 1.9 (calc) (ref 1.0–2.5)
ALBUMIN MSPROF: 4.8 g/dL (ref 3.6–5.1)
ALKALINE PHOSPHATASE (APISO): 85 U/L (ref 33–130)
ALT: 13 U/L (ref 6–29)
AST: 23 U/L (ref 10–35)
BILIRUBIN TOTAL: 0.7 mg/dL (ref 0.2–1.2)
BUN/Creatinine Ratio: 18 (calc) (ref 6–22)
BUN: 23 mg/dL (ref 7–25)
CHLORIDE: 101 mmol/L (ref 98–110)
CO2: 31 mmol/L (ref 20–32)
CREATININE: 1.31 mg/dL — AB (ref 0.60–0.88)
Calcium: 10.6 mg/dL — ABNORMAL HIGH (ref 8.6–10.4)
GFR, Est African American: 44 mL/min/{1.73_m2} — ABNORMAL LOW (ref >=60)
GFR, Est Non African American: 38 mL/min/{1.73_m2} — ABNORMAL LOW (ref >=60)
GLUCOSE: 96 mg/dL (ref 65–99)
Globulin: 2.5 g/dL (calc) (ref 1.9–3.7)
Potassium: 4.1 mmol/L (ref 3.5–5.3)
Sodium: 142 mmol/L (ref 135–146)
TOTAL PROTEIN: 7.3 g/dL (ref 6.1–8.1)

## 2018-10-15 LAB — TSH: TSH: 2.7 m[IU]/L (ref 0.40–4.50)

## 2018-10-15 LAB — URIC ACID: URIC ACID, SERUM: 6.4 mg/dL (ref 2.5–7.0)

## 2018-10-15 LAB — HEMOGLOBIN A1C
Hgb A1c MFr Bld: 6.1 % of total Hgb — ABNORMAL HIGH (ref <5.7)
Mean Plasma Glucose: 128 (calc)
eAG (mmol/L): 7.1 (calc)

## 2018-10-15 LAB — LIPID PANEL
Cholesterol: 234 mg/dL — ABNORMAL HIGH (ref <200)
HDL: 56 mg/dL (ref >50)
LDL CHOLESTEROL (CALC): 151 mg/dL — AB
Non-HDL Cholesterol (Calc): 178 mg/dL (calc) — ABNORMAL HIGH (ref <130)
TRIGLYCERIDES: 143 mg/dL (ref <150)
Total CHOL/HDL Ratio: 4.2 (calc) (ref <5.0)

## 2018-10-15 LAB — VITAMIN D 25 HYDROXY (VIT D DEFICIENCY, FRACTURES): VIT D 25 HYDROXY: 98 ng/mL (ref 30–100)

## 2018-10-15 LAB — INSULIN, RANDOM: Insulin: 1.9 u[IU]/mL — ABNORMAL LOW (ref 2.0–19.6)

## 2018-10-15 LAB — MAGNESIUM: MAGNESIUM: 1.8 mg/dL (ref 1.5–2.5)

## 2018-10-17 ENCOUNTER — Encounter: Payer: Self-pay | Admitting: Internal Medicine

## 2018-11-05 ENCOUNTER — Other Ambulatory Visit: Payer: Self-pay | Admitting: Adult Health

## 2018-11-05 DIAGNOSIS — E782 Mixed hyperlipidemia: Secondary | ICD-10-CM

## 2018-11-25 ENCOUNTER — Other Ambulatory Visit: Payer: Self-pay | Admitting: Internal Medicine

## 2018-12-08 ENCOUNTER — Other Ambulatory Visit: Payer: Self-pay | Admitting: Internal Medicine

## 2018-12-08 DIAGNOSIS — E1140 Type 2 diabetes mellitus with diabetic neuropathy, unspecified: Secondary | ICD-10-CM

## 2018-12-08 MED ORDER — GABAPENTIN 300 MG PO CAPS: ORAL_CAPSULE | ORAL | 1 refills | Status: DC

## 2019-01-14 ENCOUNTER — Encounter: Payer: Self-pay | Admitting: Physician Assistant

## 2019-01-14 ENCOUNTER — Ambulatory Visit (INDEPENDENT_AMBULATORY_CARE_PROVIDER_SITE_OTHER): Payer: PPO | Admitting: Physician Assistant

## 2019-01-14 VITALS — BP 124/80 | HR 67 | Temp 97.1°F | Ht 62.5 in | Wt 147.2 lb

## 2019-01-14 DIAGNOSIS — E1140 Type 2 diabetes mellitus with diabetic neuropathy, unspecified: Secondary | ICD-10-CM

## 2019-01-14 DIAGNOSIS — E782 Mixed hyperlipidemia: Secondary | ICD-10-CM | POA: Diagnosis not present

## 2019-01-14 DIAGNOSIS — T451X5A Adverse effect of antineoplastic and immunosuppressive drugs, initial encounter: Secondary | ICD-10-CM

## 2019-01-14 DIAGNOSIS — Z0000 Encounter for general adult medical examination without abnormal findings: Secondary | ICD-10-CM

## 2019-01-14 DIAGNOSIS — Z170 Estrogen receptor positive status [ER+]: Secondary | ICD-10-CM

## 2019-01-14 DIAGNOSIS — N183 Chronic kidney disease, stage 3 unspecified: Secondary | ICD-10-CM

## 2019-01-14 DIAGNOSIS — E559 Vitamin D deficiency, unspecified: Secondary | ICD-10-CM

## 2019-01-14 DIAGNOSIS — G620 Drug-induced polyneuropathy: Secondary | ICD-10-CM

## 2019-01-14 DIAGNOSIS — Z0001 Encounter for general adult medical examination with abnormal findings: Secondary | ICD-10-CM | POA: Diagnosis not present

## 2019-01-14 DIAGNOSIS — R6889 Other general symptoms and signs: Secondary | ICD-10-CM | POA: Diagnosis not present

## 2019-01-14 DIAGNOSIS — E1122 Type 2 diabetes mellitus with diabetic chronic kidney disease: Secondary | ICD-10-CM

## 2019-01-14 DIAGNOSIS — E348 Other specified endocrine disorders: Secondary | ICD-10-CM | POA: Diagnosis not present

## 2019-01-14 DIAGNOSIS — C50212 Malignant neoplasm of upper-inner quadrant of left female breast: Secondary | ICD-10-CM

## 2019-01-14 DIAGNOSIS — I10 Essential (primary) hypertension: Secondary | ICD-10-CM

## 2019-01-14 DIAGNOSIS — Z79899 Other long term (current) drug therapy: Secondary | ICD-10-CM | POA: Diagnosis not present

## 2019-01-14 DIAGNOSIS — E1169 Type 2 diabetes mellitus with other specified complication: Secondary | ICD-10-CM | POA: Diagnosis not present

## 2019-01-14 DIAGNOSIS — M1000 Idiopathic gout, unspecified site: Secondary | ICD-10-CM | POA: Diagnosis not present

## 2019-01-14 NOTE — Patient Instructions (Addendum)
Go down to 568m ONCE a day You may be able to try 258m3 x a day with meals  Ways to prevent diarrhea with magnesium:  1) Don't take all your magnesium at the same time, have 2-3 smaller doses through out the day 2) Try taking your magnesium with high fiber meals.  3) If this does not help, take the magnesium on an empty stomach. Fiber for some people can bind the magnesium too well and prevent absorption in your gut.  4) Lastly try different types of magnesium. Most people are taking magnesium citrate, you can also try dimalate capsules which are slow release. You can also find magnesium lotions/sprays for the skin that bypass the gut. Another one that has good absorption is ReMag (pico-iconic magnesium formula), this has great cellular absorption so less of a laxative effect. You can find these type at health food stores or online.    GENERAL HEALTH GOALS  Know what a healthy weight is for you (roughly BMI <25) and aim to maintain this  Aim for 7+ servings of fruits and vegetables daily  70-80+ fluid ounces of water or unsweet tea for healthy kidneys  Limit to max 1 drink of alcohol per day; avoid smoking/tobacco  Limit animal fats in diet for cholesterol and heart health - choose grass fed whenever available  Avoid highly processed foods, and foods high in saturated/trans fats  Aim for low stress - take time to unwind and care for your mental health  Aim for 150 min of moderate intensity exercise weekly for heart health, and weights twice weekly for bone health  Aim for 7-9 hours of sleep daily

## 2019-01-14 NOTE — Progress Notes (Signed)
MEDICARE ANNUAL WELLNESS VISIT AND FOLLOW UP  Assessment:   Encounter for Medicare annual wellness exam 1 year  Essential hypertension Continue medication Monitor blood pressure at home; call if consistently over 130/80 Continue DASH diet.   Reminder to go to the ER if any CP, SOB, nausea, dizziness, severe HA, changes vision/speech, left arm numbness and tingling and jaw pain.  Type 2 diabetes mellitus with stage 3 chronic kidney disease, without long-term current use of insulin [facility]) Education: Reviewed 'ABCs' of diabetes management (respective goals in parentheses):  A1C (<7), blood pressure (<130/80), and cholesterol (LDL <70) Eye Exam yearly and Dental Exam every 6 months. 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 Hollie Salk  Vitamin D deficiency At goal at recent check; continue to recommend supplementation for goal of 70-100 Defer vitamin D level  Port catheter in place Follow up PRN gen surgery, monitor closely for infection  Medication management CBC, CMP/GFR, magnesium  Hyperlipidemia Continue medications Continue low cholesterol diet and exercise.  Check lipid panel.   Idiopathic gout, unspecified chronicity, unspecified site Continue allopurinol Diet discussed Check uric acid as needed  Malignant neoplasm of upper-inner quadrant of left breast in female, estrogen receptor positive ([place]) Followed by [name], continue anastrozole  BMI 24.0-24.9, adult Continue to recommend diet heavy in fruits and veggies and low in animal meats, cheeses, and dairy products, appropriate calorie intake Discuss exercise recommendations routinely Continue to monitor weight at each visit  Abnormal stool Cut  back on magnesium or take 250 3 x a day- started 6 months ago with increase in magnesium, normal AB If not better will return for pelvic/rectal exam, declines today    Over 30 minutes of exam, counseling, chart review, and critical decision making was performed  Plan:   During the course of the visit the patient was educated and counseled about appropriate screening and preventive services including:    Pneumococcal vaccine   Influenza vaccine  Td vaccine  Prevnar 13  Screening electrocardiogram  Screening mammography  Bone densitometry screening  Colorectal cancer screening  Diabetes screening  Glaucoma screening  Nutrition counseling   Advanced directives: given info/requested copies  Conditions/risks identified: Diabetes is at goal, ACE/ARB therapy: No, Reason not on Ace Inhibitor/ARB therapy:  Hypotension Urinary Incontinence is not an issue: discussed non pharmacology and pharmacology options.  Fall risk: low- discussed PT, home fall assessment, medications.    Subjective:   Kayla Baker is a 83 y.o. female who presents for Medicare Annual Wellness Visit and 3 month follow up on hypertension, T2 diabetes, hyperlipidemia, vitamin D def.   [name] She states every time she urinates she will have a BM, has soft stools each time, will feel urge to defecate with urination, not pencil thin, No vaginal pressure. No blood in stool. Her Magnesium was increase to 1064m a day.   Other significant current problems include hx/o Triple (+) Lt Breast Ca w/ Partial Lt Mastectomy  in April 2017 followed by Chemo tx 5-07/2016 then Radiation in Oct; she is on anastrozole, followed by Dr GLindi Adie Mammogram from 03/2018, s/p BX.  She has bilateral knee and shoulder pain. She has had 2 arthroscopic surgeries on her right knee. No hip pain, some trouble with standing. Does follow up  ortho.   BMI is Body mass index is 26.49 kg/m.,  she has been working on diet, pushing fruits/vegetables, watching white carbs; she does have [employer] membership but hasn't been going. She is very active with housework and yardwork.  Wt Readings from Last 3 Encounters:  01/14/19 147 lb 3.2 oz (66.8 kg)  10/14/18 141 lb 9.6 oz (64.2 kg)  09/09/18 143 lb 4.8 oz (65 kg)   Her blood pressure has been controlled at home, today their BP is BP: 124/80 She does not workout. She denies chest pain, shortness of breath, dizziness.   She is on cholesterol medication (zetia due to hx of LFT elevation with statin) and denies myalgias. Her cholesterol is at goal. The cholesterol last visit was:   Lab Results  Component Value Date   CHOL 234 (H) 10/14/2018   HDL 56 10/14/2018   LDLCALC 151 (H) 10/14/2018   TRIG 143 10/14/2018   CHOLHDL 4.2 10/14/2018   She has been working on diet and exercise for T2 diabetes on metformin (takes 2 tabs of 500 mg daily), and denies foot ulcerations, hyperglycemia, hypoglycemia , increased appetite, nausea, polydipsia, polyuria, visual disturbances, vomiting and weight loss.She reports that her blood sugars have been running 107-109. She does have some mild neuropathy in her feet, coming off Cymbalta and going to continue on gabapentin..    Last A1C in the office was:  Lab Results  Component Value Date   HGBA1C 6.1 (H) 10/14/2018   Last GFR; followed by Dr. Hollie Salk Lab Results  Component Value Date   GFRNONAA 38 (L) 10/14/2018   Patient is on Vitamin D supplement. Lab Results  Component Value Date   VD25OH 98 10/14/2018     Patient is on allopurinol for gout and does not report a recent flare.  Lab Results  Component Value Date   LABURIC 6.4 10/14/2018     Medication Review Current Outpatient Medications on File Prior to Visit  Medication Sig Dispense Refill  . acetaminophen (TYLENOL) 325 MG tablet Take 325 mg by mouth every 6 (six) hours as needed for headache.    . allopurinol (ZYLOPRIM) 300 MG tablet TAKE 1  TABLET(300 MG) BY MOUTH DAILY 90 tablet 1  . anastrozole (ARIMIDEX) 1 MG tablet Take 1 tablet (1 mg total) by mouth daily. 90 tablet 3  . aspirin 81 MG tablet Take 81 mg by mouth daily.    . Cholecalciferol (VITAMIN D3) 5000 units CAPS Take 5,000 Units by mouth daily.    . DULoxetine (CYMBALTA) 60 MG capsule TAKE 1 CAPSULE BY MOUTH DAILY FOR NEUROPATHY OR PAIN 90 capsule 3  . ezetimibe (ZETIA) 10 MG tablet TAKE 1 TABLET BY MOUTH DAILY FOR CHOLESTEROL 90 tablet 0  . FREESTYLE TEST STRIPS test strip TEST BLOOD SUGAR ONCE DAILY AS DIRECTED 100 each 3  . gabapentin (NEURONTIN) 300 MG capsule Take 1 capsule 3 to 4 x /day as needed for pain 360 capsule 1  . hydrochlorothiazide (HYDRODIURIL) 25 MG tablet TAKE ONE TABLET BY MOUTH DAILY 90 tablet 1  . Lancets (FREESTYLE) lancets USE TO CHECK BLOOD SUGAR DAILY AS DIRECTED 100 each 3  . loratadine (CLARITIN) 10 MG tablet Take 10 mg by mouth daily as needed for allergies.    . Magnesium ([employer] MAGNESIUM) 250 MG TABS Take 500 mg by mouth 2 (two) times daily.    . metFORMIN (GLUCOPHAGE-XR) 500 MG 24 hr tablet TAKE 4 TABLETS BY MOUTH DAILY FOR BLOOD SUGAR 360 tablet 1  . metoprolol succinate (  TOPROL-XL) 25 MG 24 hr tablet TAKE 1 TABLET(25 MG) BY MOUTH DAILY 90 tablet 1  . vitamin C (ASCORBIC ACID) 500 MG tablet Take 500 mg by mouth daily. Takes 2 tablets daily     No current facility-administered medications on file prior to visit.     Current Problems (verified) Patient Active Problem List   Diagnosis Date Noted  . CKD stage 3 due to type 2 diabetes mellitus ([place]) 07/05/2018  . Port catheter in place 09/18/2016  . Chemotherapy-induced peripheral neuropathy ([place]) 08/07/2016  . Breast cancer of upper-inner quadrant of left female breast ([place]) 02/29/2016  . Gout 12/08/2014  . Diabetic neuropathy, painful ([place]) 08/28/2014  . Medication management 08/27/2014  . Essential hypertension 11/10/2013  . Hyperlipidemia 11/10/2013  . T2_NIDDM w/CKD3 (GFR 45  ml/min) 11/10/2013  . Vitamin D deficiency 11/10/2013    Screening Tests Immunization History  Administered Date(s) Administered  . DTaP 07/01/2004  . Influenza Whole 08/15/2013  . Influenza, High Dose Seasonal PF 08/28/2014, 09/20/2015, 08/19/2017, 09/01/2018  . Pneumococcal Conjugate-13 09/20/2015  . Pneumococcal Polysaccharide-23 11/01/2009  . Td 08/10/2009  . Zoster 08/02/2011    Preventative care: Last colonoscopy: 2014 Last mammogram: 03/2018 - biopsy neg Last pap smear/pelvic exam: remotely DEXA: 01/2017 DUE this year  Prior vaccinations: TD or Tdap: 2010  Influenza: 2019  Pneumococcal: 2010 Prevnar13: 2016 Shingles/Zostavax: 2012  Names of Other Physician/Practitioners you currently use: 1. [facility]- here for primary care 2. Dr. Nicki Reaper, eye doctor, last visit 02/16/2018 - report verified and abstracted 3. Dr. Jolayne Panther, dentist, last visit 2019  Patient Care Team: [name], MD as PCP - Kinnie Scales, [place] as Referring Physician (Optometry) [name], MD as Consulting Physician (Dermatology) Latanya Maudlin, MD as Consulting Physician (Orthopedic Surgery) Inda Castle, MD (Inactive) as Consulting Physician (Gastroenterology) [name], MD as Consulting Physician (Hematology and Oncology)  Allergies Allergies  Allergen Reactions  . Keflex [Cephalexin] Swelling    Tongue and throat swells  . Meloxicam Swelling  . Prednisone Swelling    Can Not take High doses  . Premarin [Estrogens Conjugated] Hives  . Trovan [Alatrofloxacin] Other (See Comments)    Unknown reaction    SURGICAL HISTORY She  has a past surgical history that includes Cataract extraction, bilateral (2014); Abdominal hysterectomy (1973); Incontinence surgery (2006); Knee arthroscopy (Right); Hand surgery (Left, 2012); Colonoscopy; Radioactive seed guided mastectomy with axillary sentinel lymph node biopsy (Left, 03/18/2016); Cardiac  catheterization; Portacath placement (N/A, 04/14/2016); Port-a-cath removal (N/A, 11/27/2016); Mastopexy (Right, 06/16/2017); Breast biopsy (Left, 02/26/2016); Breast lumpectomy (Left, 03/18/2016); and Reduction mammaplasty (Right, 2018).   FAMILY HISTORY Her family history includes Alzheimer's disease in her father; Breast cancer (age of onset: 69) in her sister; Cirrhosis in her sister; Diabetes in her father; Epilepsy in some other family members; Esophageal cancer (age of onset: 18) in her maternal aunt; Heart Problems in her maternal grandmother; Heart attack (age of onset: 37) in her maternal uncle; Hypertension in her sister; Lung cancer in an other family member; Lung cancer (age of onset: 66) in her sister; Other in her mother and sister; Other (age of onset: 14) in an other family member; Pancreatic cancer (age of onset: 57) in her cousin; Parkinson's disease in her maternal aunt; Stroke in her maternal grandmother; Stroke (age of onset: 71) in her mother.   SOCIAL HISTORY She  reports that she has never smoked. She has never used smokeless tobacco. She reports that she does not drink alcohol or  use drugs.  MEDICARE WELLNESS OBJECTIVES: Tobacco use: She does not smoke.  Patient is not a former smoker. If yes, counseling given Alcohol Current alcohol use: social drinker Diet: in general, a "healthy" diet   Physical activity:   Cardiac risk factors:   Depression/mood screen:   Depression screen PHQ 2/9 10/17/2018  Decreased Interest 0  Down, Depressed, Hopeless 0  PHQ - 2 Score 0    ADLs:  In your present state of health, do you have any difficulty performing the following activities: 10/17/2018 07/06/2018  Hearing? N N  Vision? N N  Difficulty concentrating or making decisions? N N  Walking or climbing stairs? N N  Dressing or bathing? N N  Doing errands, shopping? N N  Some recent data might be hidden     Cognitive Testing  Alert? Yes  Normal Appearance?Yes  Oriented to  person? Yes  Place? Yes   Time? Yes  Recall of three objects?  Yes  Can perform simple calculations? Yes  Displays appropriate judgment?Yes  Can read the correct time from a watch face?Yes  EOL planning:     Objective:   Today's Vitals   01/14/19 1108  BP: 124/80  Pulse: 67  Temp: (!) 97.1 F (36.2 C)  SpO2: 99%  Weight: 147 lb 3.2 oz (66.8 kg)  Height: 5' 2.5" (1.588 m)  PainSc: 0-No pain   Body mass index is 26.49 kg/m.  General appearance: alert, no distress, WD/WN,  female HEENT: normocephalic, sclerae anicteric, TMs pearly, nares patent, no discharge or erythema, pharynx normal Oral cavity: MMM, no lesions Neck: supple, no lymphadenopathy, no thyromegaly, no masses Heart: RRR, normal S1, S2, no murmurs Lungs: CTA bilaterally, no wheezes, rhonchi, or rales Abdomen: +bs, soft, non tender, non distended, no masses, no hepatomegaly, no splenomegaly Musculoskeletal: nontender, no swelling, no obvious deformity Extremities: no edema, no cyanosis, no clubbing Pulses: 2+ symmetric, upper and lower extremities, normal cap refill Neurological: alert, oriented x 3, CN2-12 intact, strength normal upper extremities and lower extremities, sensation normal throughout, DTRs 2+ throughout, no cerebellar signs, gait normal Psychiatric: normal affect, behavior normal, pleasant  Breast: Left breast with well healing incision to the superior left breast with no redness, swelling or warmth. Tender to palpation.  Left axilla with well healing incision.  Dermabond peeling   Gyn: defer Rectal: defer   Medicare Attestation I have personally reviewed: The patient's medical and social history Their use of alcohol, tobacco or illicit drugs Their current medications and supplements The patient's functional ability including ADLs,fall risks, home safety risks, cognitive, and hearing and visual impairment Diet and physical activities Evidence for depression or mood disorders  The patient's  weight, height, BMI, and visual acuity have been recorded in the chart.  I have made referrals, counseling, and provided education to the patient based on review of the above and I have provided the patient with a written personalized care plan for preventive services.     Vicie Mutters, PA-C   01/14/2019

## 2019-01-15 LAB — LIPID PANEL
CHOL/HDL RATIO: 3.8 (calc) (ref <5.0)
Cholesterol: 197 mg/dL (ref <200)
HDL: 52 mg/dL (ref >=50)
LDL Cholesterol (Calc): 114 mg/dL (calc) — ABNORMAL HIGH
Non-HDL Cholesterol (Calc): 145 mg/dL (calc) — ABNORMAL HIGH (ref <130)
Triglycerides: 185 mg/dL — ABNORMAL HIGH (ref <150)

## 2019-01-15 LAB — COMPLETE METABOLIC PANEL WITH GFR
AG Ratio: 1.7 (calc) (ref 1.0–2.5)
ALKALINE PHOSPHATASE (APISO): 91 U/L (ref 37–153)
ALT: 21 U/L (ref 6–29)
AST: 24 U/L (ref 10–35)
Albumin: 4.4 g/dL (ref 3.6–5.1)
BUN/Creatinine Ratio: 20 (calc) (ref 6–22)
BUN: 27 mg/dL — ABNORMAL HIGH (ref 7–25)
CALCIUM: 10.8 mg/dL — AB (ref 8.6–10.4)
CO2: 28 mmol/L (ref 20–32)
CREATININE: 1.33 mg/dL — AB (ref 0.60–0.88)
Chloride: 103 mmol/L (ref 98–110)
GFR, Est African American: 44 mL/min/{1.73_m2} — ABNORMAL LOW (ref >=60)
GFR, Est Non African American: 38 mL/min/{1.73_m2} — ABNORMAL LOW (ref >=60)
GLOBULIN: 2.6 g/dL (ref 1.9–3.7)
GLUCOSE: 104 mg/dL — AB (ref 65–99)
Potassium: 4.3 mmol/L (ref 3.5–5.3)
SODIUM: 142 mmol/L (ref 135–146)
Total Bilirubin: 0.6 mg/dL (ref 0.2–1.2)
Total Protein: 7 g/dL (ref 6.1–8.1)

## 2019-01-15 LAB — CBC WITH DIFFERENTIAL/PLATELET
Absolute Monocytes: 545 cells/uL (ref 200–950)
BASOS ABS: 41 {cells}/uL (ref 0–200)
Basophils Relative: 0.7 %
EOS ABS: 151 {cells}/uL (ref 15–500)
EOS PCT: 2.6 %
HCT: 39.6 % (ref 35.0–45.0)
HEMOGLOBIN: 13.4 g/dL (ref 11.7–15.5)
Lymphs Abs: 1926 cells/uL (ref 850–3900)
MCH: 29.9 pg (ref 27.0–33.0)
MCHC: 33.8 g/dL (ref 32.0–36.0)
MCV: 88.4 fL (ref 80.0–100.0)
MPV: 10.6 fL (ref 7.5–12.5)
Monocytes Relative: 9.4 %
NEUTROS ABS: 3138 {cells}/uL (ref 1500–7800)
Neutrophils Relative %: 54.1 %
Platelets: 180 10*3/uL (ref 140–400)
RBC: 4.48 10*6/uL (ref 3.80–5.10)
RDW: 14.1 % (ref 11.0–15.0)
Total Lymphocyte: 33.2 %
WBC: 5.8 10*3/uL (ref 3.8–10.8)

## 2019-01-15 LAB — HEMOGLOBIN A1C
Hgb A1c MFr Bld: 6.5 % of total Hgb — ABNORMAL HIGH (ref <5.7)
Mean Plasma Glucose: 140 (calc)
eAG (mmol/L): 7.7 (calc)

## 2019-01-15 LAB — TSH: TSH: 3.12 mIU/L (ref 0.40–4.50)

## 2019-01-17 ENCOUNTER — Other Ambulatory Visit: Payer: Self-pay | Admitting: Hematology and Oncology

## 2019-01-17 DIAGNOSIS — Z9889 Other specified postprocedural states: Secondary | ICD-10-CM

## 2019-02-09 ENCOUNTER — Other Ambulatory Visit: Payer: Self-pay | Admitting: Internal Medicine

## 2019-02-09 DIAGNOSIS — E782 Mixed hyperlipidemia: Secondary | ICD-10-CM

## 2019-02-09 MED ORDER — EZETIMIBE 10 MG PO TABS: ORAL_TABLET | ORAL | 3 refills | Status: DC

## 2019-03-03 ENCOUNTER — Ambulatory Visit
Admission: RE | Admit: 2019-03-03 | Discharge: 2019-03-03 | Disposition: A | Discharge status: Home / Self Care | Payer: PPO | Source: Ambulatory Visit | Attending: Hematology and Oncology | Admitting: Hematology and Oncology

## 2019-03-03 ENCOUNTER — Other Ambulatory Visit: Payer: Self-pay

## 2019-03-03 DIAGNOSIS — R0602 Shortness of breath: Secondary | ICD-10-CM | POA: Diagnosis not present

## 2019-03-03 DIAGNOSIS — Z9889 Other specified postprocedural states: Secondary | ICD-10-CM | POA: Diagnosis not present

## 2019-03-16 ENCOUNTER — Other Ambulatory Visit: Payer: Self-pay | Admitting: Internal Medicine

## 2019-03-17 ENCOUNTER — Other Ambulatory Visit: Payer: Self-pay | Admitting: Internal Medicine

## 2019-03-21 ENCOUNTER — Other Ambulatory Visit: Payer: Self-pay | Admitting: Internal Medicine

## 2019-03-21 DIAGNOSIS — E1140 Type 2 diabetes mellitus with diabetic neuropathy, unspecified: Secondary | ICD-10-CM

## 2019-03-21 MED ORDER — GABAPENTIN 400 MG PO CAPS: ORAL_CAPSULE | ORAL | 1 refills | Status: DC

## 2019-03-24 ENCOUNTER — Other Ambulatory Visit: Payer: PPO

## 2019-04-17 ENCOUNTER — Encounter: Payer: Self-pay | Admitting: Internal Medicine

## 2019-04-17 NOTE — Progress Notes (Signed)
[facility] [name], M.D.     Kayla Baker. Kayla Baker, P.A.-C Liane Comber, [place] [address] [place], [place] [address] Telephone [phone] Telefax [phone] Annual Screening/Preventative Visit & Comprehensive Evaluation &  Examination  History of Present Illness:     This very nice 83 y.o. MWF presents for a Screening /Preventative Visit & comprehensive evaluation and management of multiple medical co-morbidities.  Patient has been followed for HTN, HLD, T2_NIDDM  and Vitamin D Deficiency.She has hx/o Gout & GERD controlled on her meds. Patient reports change in BM's with watery diarrhea x 6 month. No fever, chills, rash, blood/mucus in BM's, pets. Has a sister w/Celiac Dz.      Patient has hx/o Lt Breast Ca (DCIS) March 2017 s/p Lt Lumpectomy - completed ChemoRadiation and since then on Anastrozole per Dr Lindi Adie           HTN predates since 2000. Patient's BP has been controlled at home and patient denies any cardiac symptoms as chest pain, palpitations, shortness of breath, dizziness or ankle swelling. Today's BP is at goal - 122/68.      Patient's hyperlipidemia is not controlled with diet and Zetia. Patient denies myalgias or other medication SE's. Last lipids were not at goal: Lab Results  Component Value Date   CHOL 197 01/14/2019   HDL 52 01/14/2019   LDLCALC 114 (H) 01/14/2019   TRIG 185 (H) 01/14/2019   CHOLHDL 3.8 01/14/2019      Patient has hx/o T2_NIDDM (2010) with CKD3 and patient denies reactive hypoglycemic symptoms, visual blurring, diabetic polys, but she does have painful paresthesias of feet  / distal legs attributed to her Diabetes and her Chemotx (Abraxane). Patient takes Gabapentin for her Neuropathy & has tapered off of Duloxetine. Last A1c was not at goal: Lab Results  Component Value Date   HGBA1C 6.5 (H) 01/14/2019      Finally, patient has history of Vitamin D  Deficiency  ("33" / 2008) and her last Vitamin D was at goal: Lab Results  Component Value Date   VD25OH 98 10/14/2018   Current Outpatient Medications on File Prior to Visit  Medication Sig  . acetaminophen (TYLENOL) 325 MG tablet Take 325 mg by mouth every 6 (six) hours as needed for headache.  . allopurinol (ZYLOPRIM) 300 MG tablet TAKE 1 TABLET(300 MG) BY MOUTH DAILY  . anastrozole (ARIMIDEX) 1 MG tablet Take 1 tablet (1 mg total) by mouth daily.  Marland Kitchen aspirin 81 MG tablet Take 81 mg by mouth daily.  . Cholecalciferol (VITAMIN D3) 5000 units CAPS Take 5,000 Units by mouth daily.  Marland Kitchen ezetimibe (ZETIA) 10 MG tablet Take 1 tablet Daily for Cholesterol  . FREESTYLE TEST STRIPS test strip TEST BLOOD SUGAR ONCE DAILY AS DIRECTED  . gabapentin (NEURONTIN) 400 MG capsule Take 1 capsule 3 to 4  x /day as needed for pain  . hydrochlorothiazide (HYDRODIURIL) 25 MG tablet TAKE 1 TABLET BY MOUTH DAILY  . Lancets (FREESTYLE) lancets USE TO CHECK BLOOD SUGAR DAILY AS DIRECTED  . loratadine (CLARITIN) 10 MG tablet Take 10 mg by mouth daily as needed for allergies.  . Magnesium ([employer] MAGNESIUM) 250 MG TABS Take 500 mg by mouth 2 (two) times daily.  . metFORMIN (GLUCOPHAGE-XR) 500 MG 24 hr tablet TAKE 4 TABLETS BY MOUTH DAILY FOR BLOOD SUGAR  . metoprolol succinate (TOPROL-XL) 25 MG 24 hr tablet TAKE 1 TABLET(25 MG) BY MOUTH DAILY  . vitamin C (ASCORBIC  ACID) 500 MG tablet Take 500 mg by mouth daily. Takes 2 tablets daily   No current facility-administered medications on file prior to visit.    Allergies  Allergen Reactions  . Keflex [Cephalexin] Swelling    Tongue and throat swells  . Meloxicam Swelling  . Prednisone Swelling    Can Not take High doses  . Premarin [Estrogens Conjugated] Hives  . Trovan [Alatrofloxacin] Other (See Comments)    Unknown reaction   Past Medical History:  Diagnosis Date  . Allergy   . Anemia   . Arthritis   . Blood transfusion without reported diagnosis   . Breast  cancer of upper-inner quadrant of left female breast ([place]) 02/29/2016   skin- 2016- squamous- on right upper arm  . Diet-controlled type 2 diabetes mellitus ([place])    diet and exercsise,Has never been on meds, type II   . GERD (gastroesophageal reflux disease)   . Gout    takes Allopurinol daily  . History of chemotherapy   . History of colon polyps    benign  . History of shingles   . Hx of radiation therapy   . Hyperlipidemia   . Hypertension   . Personal history of chemotherapy    2017  . Personal history of radiation therapy    2017  . Vitamin D deficiency    Health Maintenance  Topic Date Due  . OPHTHALMOLOGY EXAM  02/17/2019  . URINE MICROALBUMIN  03/24/2019  . INFLUENZA VACCINE  07/02/2019  . HEMOGLOBIN A1C  07/15/2019  . TETANUS/TDAP  08/15/2019  . FOOT EXAM  04/16/2020  . DEXA SCAN  Completed  . PNA vac Low Risk Adult  Completed   Immunization History  Administered Date(s) Administered  . DTaP 07/01/2004  . Influenza Whole 08/15/2013  . Influenza, High Dose Seasonal PF 08/28/2014, 09/20/2015, 08/19/2017, 09/01/2018  . Pneumococcal Conjugate-13 09/20/2015  . Pneumococcal Polysaccharide-23 11/01/2009  . Td 08/10/2009  . Zoster 08/02/2011   Last Colon -   Last MGM -   Past Surgical History:  Procedure Laterality Date  . ABDOMINAL HYSTERECTOMY  1973  . BREAST BIOPSY Left 02/26/2016  . BREAST LUMPECTOMY Left 03/18/2016  . CARDIAC CATHETERIZATION     patient denies this procedure  . CATARACT EXTRACTION, BILATERAL  2014   right eye 2/14; left eye 3/3  . COLONOSCOPY    . HAND SURGERY Left 2012  . INCONTINENCE SURGERY  2006  . KNEE ARTHROSCOPY Right   . MASTOPEXY Right 06/16/2017   Procedure: RIGHT BREAST MASTOPEXY;  Surgeon: Irene Limbo, MD;  Location: [facility];  Service: Plastics;  Laterality: Right;  . PORT-A-CATH REMOVAL N/A 11/27/2016   Procedure: REMOVAL PORT-A-CATH;  Surgeon: Fanny Skates, MD;  Location: [facility];  Service:  General;  Laterality: N/A;  . PORTACATH PLACEMENT N/A 04/14/2016   Procedure: INSERTION PORT-A-CATH ;  Surgeon: Fanny Skates, MD;  Location: [facility];  Service: General;  Laterality: N/A;  . RADIOACTIVE SEED GUIDED PARTIAL MASTECTOMY WITH AXILLARY SENTINEL LYMPH NODE BIOPSY Left 03/18/2016   Procedure: RADIOACTIVE SEED GUIDED LEFT PARTIAL MASTECTOMY WITH AXILLARY SENTINEL LYMPH NODE BIOPSY AND BLUE DYE INJECTION;  Surgeon: Fanny Skates, MD;  Location: [facility];  Service: General;  Laterality: Left;  . REDUCTION MAMMAPLASTY Right 2018   Family History  Problem Relation Age of Onset  . Stroke Mother 44  . Other Mother        history of hysterectomy after last childbirth  . Diabetes Father   . Alzheimer's disease Father   .  Hypertension Sister   . Lung cancer Sister 78       smoker  . Other Sister        history of hysterectomy for fibroids  . Breast cancer Sister 41  . Esophageal cancer Maternal Aunt 74       not a smoker  . Heart attack Maternal Uncle 65  . Cirrhosis Sister   . Lung cancer Other        nephew dx. 86s; +smoker  . Epilepsy Other   . Other Other 12       great niece dx. benign ganglioglioma brain tumor; [facility]  . Epilepsy Other        no seizures in 2 years  . Parkinson's disease Maternal Aunt   . Stroke Maternal Grandmother   . Heart Problems Maternal Grandmother   . Pancreatic cancer Cousin 60       paternal 1st cousin  . Colon cancer Neg Hx   . Stomach cancer Neg Hx   . Colon polyps Neg Hx   . Ulcerative colitis Neg Hx    Social History   Tobacco Use  . Smoking status: Never Smoker  . Smokeless tobacco: Never Used  Substance Use Topics  . Alcohol use: No  . Drug use: No    ROS Constitutional: Denies fever, chills, weight loss/gain, headaches, insomnia,  night sweats, and change in appetite. Does c/o fatigue. Eyes: Denies redness, blurred vision, diplopia, discharge, itchy, watery eyes.  ENT: Denies discharge, congestion, post nasal drip,  epistaxis, sore throat, earache, hearing loss, dental pain, Tinnitus, Vertigo, Sinus pain, snoring.  Cardio: Denies chest pain, palpitations, irregular heartbeat, syncope, dyspnea, diaphoresis, orthopnea, PND, claudication, edema Respiratory: denies cough, dyspnea, DOE, pleurisy, hoarseness, laryngitis, wheezing.  Gastrointestinal: Denies dysphagia, heartburn, reflux, water brash, pain, cramps, nausea, vomiting, bloating, diarrhea, constipation, hematemesis, melena, hematochezia, jaundice, hemorrhoids Genitourinary: Denies dysuria, frequency, urgency, nocturia, hesitancy, discharge, hematuria, flank pain Breast: Breast lumps, nipple discharge, bleeding.  Musculoskeletal: Denies arthralgia, myalgia, stiffness, Jt. Swelling, pain, limp, and strain/sprain. Denies falls. Skin: Denies puritis, rash, hives, warts, acne, eczema, changing in skin lesion Neuro: No weakness, tremor, incoordination, spasms, paresthesia, pain Psychiatric: Denies confusion, memory loss, sensory loss. Denies Depression. Endocrine: Denies change in weight, skin, hair change, nocturia, and paresthesia, diabetic polys, visual blurring, hyper / hypo glycemic episodes.  Heme/Lymph: No excessive bleeding, bruising, enlarged lymph nodes.  Physical Exam  BP 122/68   Pulse 83   Temp 97.8 F (36.6 C)   Ht 5' 4"  (1.626 m)   Wt 146 lb 12.8 oz (66.6 kg)   SpO2 99%   BMI 25.20 kg/m   General Appearance: Well nourished, well groomed and in no apparent distress.  Eyes: PERRLA, EOMs, conjunctiva no swelling or erythema, normal fundi and vessels. Sinuses: No frontal/maxillary tenderness ENT/Mouth: EACs patent / TMs  nl. Nares clear without erythema, swelling, mucoid exudates. Oral hygiene is good. No erythema, swelling, or exudate. Tongue normal, non-obstructing. Tonsils not swollen or erythematous. Hearing normal.  Neck: Supple, thyroid not palpable. No bruits, nodes or JVD. Respiratory: Respiratory effort normal.  BS equal and  clear bilateral without rales, rhonci, wheezing or stridor. Cardio: Heart sounds are normal with regular rate and rhythm and no murmurs, rubs or gallops. Peripheral pulses are normal and equal bilaterally without edema. No aortic or femoral bruits. Chest: symmetric with normal excursions and percussion. Breasts: Symmetric, without lumps, nipple discharge, retractions, or fibrocystic changes.  Abdomen: Flat, soft with bowel sounds active. Nontender, no guarding, rebound,  hernias, masses, or organomegaly.  Lymphatics: Non tender without lymphadenopathy.  Musculoskeletal: Full ROM all peripheral extremities, joint stability, 5/5 strength, and normal gait. Skin: Warm and dry without rashes, lesions, cyanosis, clubbing or  ecchymosis.  Neuro: Cranial nerves intact, reflexes equal bilaterally. Normal muscle tone, no cerebellar symptoms. Sensation intact.  Pysch: Alert and oriented X 3, normal affect, Insight and Judgment appropriate.   Assessment and Plan  1. Annual Preventative Screening Examination  2. Essential hypertension  - EKG 12-Lead - Urinalysis, Routine w reflex microscopic - Microalbumin / creatinine urine ratio - CBC with Differential/Platelet - COMPLETE METABOLIC PANEL WITH GFR - Magnesium - TSH  3. Hyperlipidemia, mixed  - EKG 12-Lead - Lipid panel - TSH  4. Type 2 diabetes mellitus with stage 3 chronic kidney disease, without long-term current use of insulin (HCC)  - EKG 12-Lead - Urinalysis, Routine w reflex microscopic - Microalbumin / creatinine urine ratio - HM DIABETES FOOT EXAM - LOW EXTREMITY NEUR EXAM DOCUM - Hemoglobin A1c - Insulin, random  5. Vitamin D deficiency  - VITAMIN D 25 Hydroxyl  6. Idiopathic gout  - Uric acid  7. Gastroesophageal reflux disease without esophagitis  - CBC with Differential/Platelet  8. Vitamin B12 deficiency  - Vitamin B12  9. CKD stage 3 due to type 2 diabetes mellitus (HCC)  - COMPLETE METABOLIC PANEL WITH  GFR - Hemoglobin A1c - Insulin, random  10. DM type 2 with diabetic mixed hyperlipidemia (HCC)  - Lipid panel - Hemoglobin A1c - Insulin, random  11. Screening for ischemic heart disease  - EKG 12-Lead  12. History of breast cancer  13. FHx: heart disease  - EKG 12-Lead  14. Medication management  - Urinalysis, Routine w reflex microscopic - Microalbumin / creatinine urine ratio - Vitamin B12 - CBC with Differential/Platelet - COMPLETE METABOLIC PANEL WITH GFR - Magnesium - Lipid panel - TSH - Hemoglobin A1c - Insulin, random - VITAMIN D 25 Hydroxyl - Uric acid      Patient was counseled in prudent diet to achieve/maintain BMI less than 25 for weight control, BP monitoring, regular exercise and medications. Discussed med's effects and SE's. Screening labs and tests as requested with regular follow-up as recommended. I discussed the assessment and treatment plan as above with the patient. The patient was provided an opportunity to ask questions and all were answered. The patient agreed with the plan and demonstrated an understanding of the instructions. I provided  over 40 minutes of exam, counseling, chart review and  complex critical decision making was performed.        Kirtland Bouchard, MD

## 2019-04-17 NOTE — Patient Instructions (Signed)
>>>>>>>>>>>>>>>>>>>>>>>>>>>>>>>>>>>>>>>>>>>>>>>>>>>>>>> Coronavirus (COVID-19) Are you at risk?  Are you at risk for the Coronavirus (COVID-19)?  To be considered HIGH RISK for Coronavirus (COVID-19), you have to meet the following criteria:  . Traveled to [place], [place], [place], [place] or [place]; or in the [place] to [place], [place], [place]  . or [place]; and have fever, cough, and shortness of breath within the last 2 weeks of travel OR . Been in close contact with a person diagnosed with COVID-19 within the last 2 weeks and have  . fever, cough,and shortness of breath .  . IF YOU DO NOT MEET THESE CRITERIA, YOU ARE CONSIDERED LOW RISK FOR COVID-19.  What to do if you are HIGH RISK for COVID-19?  Marland Kitchen If you are having a medical emergency, call 911. . Seek medical care right away. Before you go to a doctor's office, urgent care or emergency department, .  call ahead and tell them about your recent travel, contact with someone diagnosed with COVID-19  .  and your symptoms.  . You should receive instructions from your physician's office regarding next steps of care.  . When you arrive at healthcare provider, tell the healthcare staff immediately you have returned from  . visiting [place], [place], [place], [place] or [place]; or traveled in the [place] to [place], [place],  . [place] or [place] in the last two weeks or you have been in close contact with a person diagnosed with  . COVID-19 in the last 2 weeks.   . Tell the health care staff about your symptoms: fever, cough and shortness of breath. . After you have been seen by a medical provider, you will be either: o Tested for (COVID-19) and discharged home on quarantine except to seek medical care if  o symptoms worsen, and asked to  - Stay home and avoid contact with others until you get your results (4-5 days)  - Avoid travel on public transportation if possible (such as bus, train, or airplane)  or o Sent to the Emergency Department by EMS for evaluation, COVID-19 testing  and  o possible admission depending on your condition and test results.  What to do if you are LOW RISK for COVID-19?  Reduce your risk of any infection by using the same precautions used for avoiding the common cold or flu:  Marland Kitchen Wash your hands often with soap and warm water for at least 20 seconds.  If soap and water are not readily available,  . use an alcohol-based hand sanitizer with at least 60% alcohol.  . If coughing or sneezing, cover your mouth and nose by coughing or sneezing into the elbow areas of your shirt or coat, .  into a tissue or into your sleeve (not your hands). . Avoid shaking hands with others and consider head nods or verbal greetings only. . Avoid touching your eyes, nose, or mouth with unwashed hands.  . Avoid close contact with people who are sick. . Avoid places or events with large numbers of people in one location, like concerts or sporting events. . Carefully consider travel plans you have or are making. . If you are planning any travel outside or inside the [place], visit the CDC's Travelers' Health webpage for the latest health notices. . If you have some symptoms but not all symptoms, continue to monitor at home and seek medical attention  . if your symptoms worsen. . If you are having a medical emergency, call 911.   . >>>>>>>>>>>>>>>>>>>>>>>>>>>>>>>>> .  We Do NOT Approve of  [facility], [employer]  Our Patients  To Do Home Visits & We Do NOT Approve of [employer] > > > > > > > > > > > > > > > > > > > > > > > > > > > > > > > > > > > > > > >  Preventive Care for Adults  A healthy lifestyle and preventive care can promote health and wellness. Preventive health guidelines for women include the following key practices.  A routine yearly physical is a good way to check with your health care provider about your health and preventive screening. It is a  chance to share any concerns and updates on your health and to receive a thorough exam.  Visit your dentist for a routine exam and preventive care every 6 months. Brush your teeth twice a day and floss once a day. Good oral hygiene prevents tooth decay and gum disease.  The frequency of eye exams is based on your age, health, family medical history, use of contact lenses, and other factors. Follow your health care provider's recommendations for frequency of eye exams.  Eat a healthy diet. Foods like vegetables, fruits, whole grains, low-fat dairy products, and lean protein foods contain the nutrients you need without too many calories. Decrease your intake of foods high in solid fats, added sugars, and salt. Eat the right amount of calories for you. Get information about a proper diet from your health care provider, if necessary.  Regular physical exercise is one of the most important things you can do for your health. Most adults should get at least 150 minutes of moderate-intensity exercise (any activity that increases your heart rate and causes you to sweat) each week. In addition, most adults need muscle-strengthening exercises on 2 or more days a week.  Maintain a healthy weight. The body mass index (BMI) is a screening tool to identify possible weight problems. It provides an estimate of body fat based on height and weight. Your health care provider can find your BMI and can help you achieve or maintain a healthy weight. For adults 20 years and older:  A BMI below 18.5 is considered underweight.  A BMI of 18.5 to 24.9 is normal.  A BMI of 25 to 29.9 is considered overweight.  A BMI of 30 and above is considered obese.  Maintain normal blood lipids and cholesterol levels by exercising and minimizing your intake of saturated fat. Eat a balanced diet with plenty of fruit and vegetables. If your lipid or cholesterol levels are high, you are over 50, or you are at high risk for heart disease,  you may need your cholesterol levels checked more frequently. Ongoing high lipid and cholesterol levels should be treated with medicines if diet and exercise are not working.  If you smoke, find out from your health care provider how to quit. If you do not use tobacco, do not start.  Lung cancer screening is recommended for adults aged 60-80 years who are at high risk for developing lung cancer because of a history of smoking. A yearly low-dose CT scan of the lungs is recommended for people who have at least a 30-pack-year history of smoking and are a current smoker or have quit within the past 15 years. A pack year of smoking is smoking an average of 1 pack of cigarettes a day for 1 year (for example: 1 pack a day for 30 years or 2 packs a  day for 15 years). Yearly screening should continue until the smoker has stopped smoking for at least 15 years. Yearly screening should be stopped for people who develop a health problem that would prevent them from having lung cancer treatment.  Avoid use of street drugs. Do not share needles with anyone. Ask for help if you need support or instructions about stopping the use of drugs.  High blood pressure causes heart disease and increases the risk of stroke.  Ongoing high blood pressure should be treated with medicines if weight loss and exercise do not work.  If you are 16-48 years old, ask your health care provider if you should take aspirin to prevent strokes.  Diabetes screening involves taking a blood sample to check your fasting blood sugar level. This should be done once every 3 years, after age 8, if you are within normal weight and without risk factors for diabetes. Testing should be considered at a younger age or be carried out more frequently if you are overweight and have at least 1 risk factor for diabetes.  Breast cancer screening is essential preventive care for women. You should practice "breast self-awareness." This means understanding the  normal appearance and feel of your breasts and may include breast self-examination. Any changes detected, no matter how small, should be reported to a health care provider. Women in their 28s and 30s should have a clinical breast exam (CBE) by a health care provider as part of a regular health exam every 1 to 3 years. After age 45, women should have a CBE every year. Starting at age 38, women should consider having a mammogram (breast X-ray test) every year. Women who have a family history of breast cancer should talk to their health care provider about genetic screening. Women at a high risk of breast cancer should talk to their health care providers about having an MRI and a mammogram every year.  Breast cancer gene (BRCA)-related cancer risk assessment is recommended for women who have family members with BRCA-related cancers. BRCA-related cancers include breast, ovarian, tubal, and peritoneal cancers. Having family members with these cancers may be associated with an increased risk for harmful changes (mutations) in the breast cancer genes BRCA1 and BRCA2. Results of the assessment will determine the need for genetic counseling and BRCA1 and BRCA2 testing.  Routine pelvic exams to screen for cancer are no longer recommended for nonpregnant women who are considered low risk for cancer of the pelvic organs (ovaries, uterus, and vagina) and who do not have symptoms. Ask your health care provider if a screening pelvic exam is right for you.  If you have had past treatment for cervical cancer or a condition that could lead to cancer, you need Pap tests and screening for cancer for at least 20 years after your treatment. If Pap tests have been discontinued, your risk factors (such as having a new sexual partner) need to be reassessed to determine if screening should be resumed. Some women have medical problems that increase the chance of getting cervical cancer. In these cases, your health care provider may  recommend more frequent screening and Pap tests.    Colorectal cancer can be detected and often prevented. Most routine colorectal cancer screening begins at the age of 69 years and continues through age 55 years. However, your health care provider may recommend screening at an earlier age if you have risk factors for colon cancer. On a yearly basis, your health care provider may provide home test kits to  check for hidden blood in the stool. Use of a small camera at the end of a tube, to directly examine the colon (sigmoidoscopy or colonoscopy), can detect the earliest forms of colorectal cancer. Talk to your health care provider about this at age 50, when routine screening begins.  Direct exam of the colon should be repeated every 5-10 years through age 75 years, unless early forms of pre-cancerous polyps or small growths are found.  Osteoporosis is a disease in which the bones lose minerals and strength with aging. This can result in serious bone fractures or breaks. The risk of osteoporosis can be identified using a bone density scan. Women ages 65 years and over and women at risk for fractures or osteoporosis should discuss screening with their health care providers. Ask your health care provider whether you should take a calcium supplement or vitamin D to reduce the rate of osteoporosis.  Menopause can be associated with physical symptoms and risks. Hormone replacement therapy is available to decrease symptoms and risks. You should talk to your health care provider about whether hormone replacement therapy is right for you.  Use sunscreen. Apply sunscreen liberally and repeatedly throughout the day. You should seek shade when your shadow is shorter than you. Protect yourself by wearing long sleeves, pants, a wide-brimmed hat, and sunglasses year round, whenever you are outdoors.  Once a month, do a whole body skin exam, using a mirror to look at the skin on your back. Tell your health care provider  of new moles, moles that have irregular borders, moles that are larger than a pencil eraser, or moles that have changed in shape or color.  Stay current with required vaccines (immunizations).  Influenza vaccine. All adults should be immunized every year.  Tetanus, diphtheria, and acellular pertussis (Td, Tdap) vaccine. Pregnant women should receive 1 dose of Tdap vaccine during each pregnancy. The dose should be obtained regardless of the length of time since the last dose. Immunization is preferred during the 27th-36th week of gestation. An adult who has not previously received Tdap or who does not know her vaccine status should receive 1 dose of Tdap. This initial dose should be followed by tetanus and diphtheria toxoids (Td) booster doses every 10 years. Adults with an unknown or incomplete history of completing a 3-dose immunization series with Td-containing vaccines should begin or complete a primary immunization series including a Tdap dose. Adults should receive a Td booster every 10 years.    Zoster vaccine. One dose is recommended for adults aged 60 years or older unless certain conditions are present.    Pneumococcal 13-valent conjugate (PCV13) vaccine. When indicated, a person who is uncertain of her immunization history and has no record of immunization should receive the PCV13 vaccine. An adult aged 19 years or older who has certain medical conditions and has not been previously immunized should receive 1 dose of PCV13 vaccine. This PCV13 should be followed with a dose of pneumococcal polysaccharide (PPSV23) vaccine. The PPSV23 vaccine dose should be obtained at least 1 or more year(s) after the dose of PCV13 vaccine. An adult aged 19 years or older who has certain medical conditions and previously received 1 or more doses of PPSV23 vaccine should receive 1 dose of PCV13. The PCV13 vaccine dose should be obtained 1 or more years after the last PPSV23 vaccine dose.    Pneumococcal  polysaccharide (PPSV23) vaccine. When PCV13 is also indicated, PCV13 should be obtained first. All adults aged 65 years and older   should be immunized. An adult younger than age 58 years who has certain medical conditions should be immunized. Any person who resides in a nursing home or long-term care facility should be immunized. An adult smoker should be immunized. People with an immunocompromised condition and certain other conditions should receive both PCV13 and PPSV23 vaccines. People with human immunodeficiency virus (HIV) infection should be immunized as soon as possible after diagnosis. Immunization during chemotherapy or radiation therapy should be avoided. Routine use of PPSV23 vaccine is not recommended for American Indians, [place] Natives, or people younger than 65 years unless there are medical conditions that require PPSV23 vaccine. When indicated, people who have unknown immunization and have no record of immunization should receive PPSV23 vaccine. One-time revaccination 5 years after the first dose of PPSV23 is recommended for people aged 19-64 years who have chronic kidney failure, nephrotic syndrome, asplenia, or immunocompromised conditions. People who received 1-2 doses of PPSV23 before age 82 years should receive another dose of PPSV23 vaccine at age 2 years or later if at least 5 years have passed since the previous dose. Doses of PPSV23 are not needed for people immunized with PPSV23 at or after age 9 years.   Preventive Services / Frequency  Ages 40 years and over  Blood pressure check.  Lipid and cholesterol check.  Lung cancer screening. / Every year if you are aged 49-80 years and have a 30-pack-year history of smoking and currently smoke or have quit within the past 15 years. Yearly screening is stopped once you have quit smoking for at least 15 years or develop a health problem that would prevent you from having lung cancer treatment.  Clinical breast exam.** / Every year  after age 53 years.   BRCA-related cancer risk assessment.** / For women who have family members with a BRCA-related cancer (breast, ovarian, tubal, or peritoneal cancers).  Mammogram.** / Every year beginning at age 56 years and continuing for as long as you are in good health. Consult with your health care provider.  Pap test.** / Every 3 years starting at age 73 years through age 8 or 48 years with 3 consecutive normal Pap tests. Testing can be stopped between 65 and 70 years with 3 consecutive normal Pap tests and no abnormal Pap or HPV tests in the past 10 years.  Fecal occult blood test (FOBT) of stool. / Every year beginning at age 58 years and continuing until age 53 years. You may not need to do this test if you get a colonoscopy every 10 years.  Flexible sigmoidoscopy or colonoscopy.** / Every 5 years for a flexible sigmoidoscopy or every 10 years for a colonoscopy beginning at age 35 years and continuing until age 78 years.  Hepatitis C blood test.** / For all people born from [birth year] through 1963 and any individual with known risks for hepatitis C.  Osteoporosis screening.** / A one-time screening for women ages 54 years and over and women at risk for fractures or osteoporosis.  Skin self-exam. / Monthly.  Influenza vaccine. / Every year.  Tetanus, diphtheria, and acellular pertussis (Tdap/Td) vaccine.** / 1 dose of Td every 10 years.  Zoster vaccine.** / 1 dose for adults aged 48 years or older.  Pneumococcal 13-valent conjugate (PCV13) vaccine.** / Consult your health care provider.  Pneumococcal polysaccharide (PPSV23) vaccine.** / 1 dose for all adults aged 14 years and older. Screening for abdominal aortic aneurysm (AAA)  by ultrasound is recommended for people who have history of high blood pressure  or who are current or former smokers. ++++++++++++++++++++ Recommend Adult Low Dose Aspirin or  coated  Aspirin 81 mg daily  To reduce risk of Colon Cancer 20 %,  Skin  Cancer 26 % ,  Melanoma 46%  and  Pancreatic cancer 60% ++++++++++++++++++++ Vitamin D goal  is between 70-100.  Please make sure that you are taking your Vitamin D as directed.  It is very important as a natural anti-inflammatory  helping hair, skin, and nails, as well as reducing stroke and heart attack risk.  It helps your bones and helps with mood. It also decreases numerous cancer risks so please take it as directed.  Low Vit D is associated with a 200-300% higher risk for CANCER  and 200-300% higher risk for HEART   ATTACK  &  STROKE.   .....................................Marland Kitchen It is also associated with higher death rate at younger ages,  autoimmune diseases like Rheumatoid arthritis, Lupus, Multiple Sclerosis.    Also many other serious conditions, like depression, Alzheimer's Dementia, infertility, muscle aches, fatigue, fibromyalgia - just to name a few. ++++++++++++++++++ Recommend the book "The END of DIETING" by Dr Excell Seltzer  & the book "The END of DIABETES " by Dr Excell Seltzer At [email or web address] - get book & Audio CD's    Being diabetic has a  300% increased risk for heart attack, stroke, cancer, and alzheimer- type vascular dementia. It is very important that you work harder with diet by avoiding all foods that are white. Avoid white rice (brown & wild rice is OK), white potatoes (sweetpotatoes in moderation is OK), White bread or wheat bread or anything made out of white flour like bagels, donuts, rolls, buns, biscuits, cakes, pastries, cookies, pizza crust, and pasta (made from white flour & egg whites) - vegetarian pasta or spinach or wheat pasta is OK. Multigrain breads like Arnold's or Pepperidge [employer], or multigrain sandwich thins or flatbreads.  Diet, exercise and weight loss can reverse and cure diabetes in the early stages.  Diet, exercise and weight loss is very important in the control and prevention of complications of diabetes which affects every system in your body, ie.  Brain - dementia/stroke, eyes - glaucoma/blindness, heart - heart attack/heart failure, kidneys - dialysis, stomach - gastric paralysis, intestines - malabsorption, nerves - severe painful neuritis, circulation - gangrene & loss of a leg(s), and finally cancer and Alzheimers.    I recommend avoid fried & greasy foods,  sweets/candy, white rice (brown or wild rice or Quinoa is OK), white potatoes (sweet potatoes are OK) - anything made from white flour - bagels, doughnuts, rolls, buns, biscuits,white and wheat breads, pizza crust and traditional pasta made of white flour & egg white(vegetarian pasta or spinach or wheat pasta is OK).  Multi-grain bread is OK - like multi-grain flat bread or sandwich thins. Avoid alcohol in excess. Exercise is also important.    Eat all the vegetables you want - avoid meat, especially red meat and dairy - especially cheese.  Cheese is the most concentrated form of trans-fats which is the worst thing to clog up our arteries. Veggie cheese is OK which can be found in the fresh produce section at [employer] or [employer] or [employer]  +++++++++++++++++++ DASH Eating Plan  DASH stands for "Dietary Approaches to Stop Hypertension."   The DASH eating plan is a healthy eating plan that has been shown to reduce high blood pressure (hypertension). Additional health benefits may include reducing the risk of type 2 diabetes mellitus, heart  disease, and stroke. The DASH eating plan may also help with weight loss. WHAT DO I NEED TO KNOW ABOUT THE DASH EATING PLAN? For the DASH eating plan, you will follow these general guidelines:  Choose foods with a percent daily value for sodium of less than 5% (as listed on the food label).  Use salt-free seasonings or herbs instead of table salt or sea salt.  Check with your health care provider or pharmacist before using salt substitutes.  Eat lower-sodium products, often labeled as "lower sodium" or "no salt added."  Eat fresh  foods.  Eat more vegetables, fruits, and low-fat dairy products.  Choose whole grains. Look for the word "whole" as the first word in the ingredient list.  Choose fish   Limit sweets, desserts, sugars, and sugary drinks.  Choose heart-healthy fats.  Eat veggie cheese   Eat more home-cooked food and less restaurant, buffet, and fast food.  Limit fried foods.  Cook foods using methods other than frying.  Limit canned vegetables. If you do use them, rinse them well to decrease the sodium.  When eating at a restaurant, ask that your food be prepared with less salt, or no salt if possible.                      WHAT FOODS CAN I EAT? Read Dr Fara Olden Fuhrman's books on The End of Dieting & The End of Diabetes  Grains Whole grain or whole wheat bread. Brown rice. Whole grain or whole wheat pasta. Quinoa, bulgur, and whole grain cereals. Low-sodium cereals. Corn or whole wheat flour tortillas. Whole grain cornbread. Whole grain crackers. Low-sodium crackers.  Vegetables Fresh or frozen vegetables (raw, steamed, roasted, or grilled). Low-sodium or reduced-sodium tomato and vegetable juices. Low-sodium or reduced-sodium tomato sauce and paste. Low-sodium or reduced-sodium canned vegetables.   Fruits All fresh, canned (in natural juice), or frozen fruits.  Protein Products  All fish and seafood.  Dried beans, peas, or lentils. Unsalted nuts and seeds. Unsalted canned beans.  Dairy Low-fat dairy products, such as skim or 1% milk, 2% or reduced-fat cheeses, low-fat ricotta or cottage cheese, or plain low-fat yogurt. Low-sodium or reduced-sodium cheeses.  Fats and Oils Tub margarines without trans fats. Light or reduced-fat mayonnaise and salad dressings (reduced sodium). Avocado. Safflower, olive, or canola oils. Natural peanut or almond butter.  Other Unsalted popcorn and pretzels. The items listed above may not be a complete list of recommended foods or beverages. Contact your  dietitian for more options.  +++++++++++++++  WHAT FOODS ARE NOT RECOMMENDED? Grains/ White flour or wheat flour White bread. White pasta. White rice. Refined cornbread. Bagels and croissants. Crackers that contain trans fat.  Vegetables  Creamed or fried vegetables. Vegetables in a . Regular canned vegetables. Regular canned tomato sauce and paste. Regular tomato and vegetable juices.  Fruits Dried fruits. Canned fruit in light or heavy syrup. Fruit juice.  Meat and Other Protein Products Meat in general - RED meat & White meat.  Fatty cuts of meat. Ribs, chicken wings, all processed meats as bacon, sausage, bologna, salami, fatback, hot dogs, bratwurst and packaged luncheon meats.  Dairy Whole or 2% milk, cream, half-and-half, and cream cheese. Whole-fat or sweetened yogurt. Full-fat cheeses or blue cheese. Non-dairy creamers and whipped toppings. Processed cheese, cheese spreads, or cheese curds.  Condiments Onion and garlic salt, seasoned salt, table salt, and sea salt. Canned and packaged gravies. Worcestershire sauce. Tartar sauce. Barbecue sauce. Teriyaki sauce. Soy sauce, including  reduced sodium. Steak sauce. Fish sauce. Oyster sauce. Cocktail sauce. Horseradish. Ketchup and mustard. Meat flavorings and tenderizers. Bouillon cubes. Hot sauce. Tabasco sauce. Marinades. Taco seasonings. Relishes.  Fats and Oils Butter, stick margarine, lard, shortening and bacon fat. Coconut, palm kernel, or palm oils. Regular salad dressings.  Pickles and olives. Salted popcorn and pretzels.  The items listed above may not be a complete list of foods and beverages to avoid.

## 2019-04-18 ENCOUNTER — Encounter: Payer: Self-pay | Admitting: Internal Medicine

## 2019-04-18 ENCOUNTER — Other Ambulatory Visit: Payer: Self-pay

## 2019-04-18 ENCOUNTER — Ambulatory Visit (INDEPENDENT_AMBULATORY_CARE_PROVIDER_SITE_OTHER): Payer: PPO | Admitting: Internal Medicine

## 2019-04-18 VITALS — BP 122/68 | HR 83 | Temp 97.8°F | Ht 64.0 in | Wt 146.8 lb

## 2019-04-18 DIAGNOSIS — E1169 Type 2 diabetes mellitus with other specified complication: Secondary | ICD-10-CM | POA: Diagnosis not present

## 2019-04-18 DIAGNOSIS — Z0000 Encounter for general adult medical examination without abnormal findings: Secondary | ICD-10-CM

## 2019-04-18 DIAGNOSIS — K219 Gastro-esophageal reflux disease without esophagitis: Secondary | ICD-10-CM

## 2019-04-18 DIAGNOSIS — E782 Mixed hyperlipidemia: Secondary | ICD-10-CM | POA: Diagnosis not present

## 2019-04-18 DIAGNOSIS — N183 Chronic kidney disease, stage 3 unspecified: Secondary | ICD-10-CM

## 2019-04-18 DIAGNOSIS — Z79899 Other long term (current) drug therapy: Secondary | ICD-10-CM

## 2019-04-18 DIAGNOSIS — E1140 Type 2 diabetes mellitus with diabetic neuropathy, unspecified: Secondary | ICD-10-CM

## 2019-04-18 DIAGNOSIS — I10 Essential (primary) hypertension: Secondary | ICD-10-CM

## 2019-04-18 DIAGNOSIS — E1122 Type 2 diabetes mellitus with diabetic chronic kidney disease: Secondary | ICD-10-CM

## 2019-04-18 DIAGNOSIS — Z136 Encounter for screening for cardiovascular disorders: Secondary | ICD-10-CM

## 2019-04-18 DIAGNOSIS — Z853 Personal history of malignant neoplasm of breast: Secondary | ICD-10-CM

## 2019-04-18 DIAGNOSIS — Z8249 Family history of ischemic heart disease and other diseases of the circulatory system: Secondary | ICD-10-CM

## 2019-04-18 DIAGNOSIS — Z0001 Encounter for general adult medical examination with abnormal findings: Secondary | ICD-10-CM

## 2019-04-18 DIAGNOSIS — M1000 Idiopathic gout, unspecified site: Secondary | ICD-10-CM

## 2019-04-18 DIAGNOSIS — R197 Diarrhea, unspecified: Secondary | ICD-10-CM | POA: Diagnosis not present

## 2019-04-18 DIAGNOSIS — B351 Tinea unguium: Secondary | ICD-10-CM

## 2019-04-18 DIAGNOSIS — E538 Deficiency of other specified B group vitamins: Secondary | ICD-10-CM

## 2019-04-18 DIAGNOSIS — E559 Vitamin D deficiency, unspecified: Secondary | ICD-10-CM

## 2019-04-18 MED ORDER — GABAPENTIN 300 MG PO CAPS: ORAL_CAPSULE | ORAL | 3 refills | Status: DC

## 2019-04-18 MED ORDER — TERBINAFINE HCL 250 MG PO TABS: 250.0000 mg | ORAL_TABLET | Freq: Every day | ORAL | 0 refills | Status: DC

## 2019-04-19 ENCOUNTER — Other Ambulatory Visit: Payer: Self-pay | Admitting: Internal Medicine

## 2019-04-19 DIAGNOSIS — R197 Diarrhea, unspecified: Secondary | ICD-10-CM | POA: Diagnosis not present

## 2019-04-19 LAB — COMPLETE METABOLIC PANEL WITH GFR
AG Ratio: 1.9 (calc) (ref 1.0–2.5)
ALT: 22 U/L (ref 6–29)
AST: 24 U/L (ref 10–35)
Albumin: 4.4 g/dL (ref 3.6–5.1)
Alkaline phosphatase (APISO): 88 U/L (ref 37–153)
BUN/Creatinine Ratio: 20 (calc) (ref 6–22)
BUN: 27 mg/dL — ABNORMAL HIGH (ref 7–25)
CO2: 31 mmol/L (ref 20–32)
Calcium: 10.7 mg/dL — ABNORMAL HIGH (ref 8.6–10.4)
Chloride: 101 mmol/L (ref 98–110)
Creat: 1.38 mg/dL — ABNORMAL HIGH (ref 0.60–0.88)
GFR, Est African American: 41 mL/min/{1.73_m2} — ABNORMAL LOW (ref >=60)
GFR, Est Non African American: 36 mL/min/{1.73_m2} — ABNORMAL LOW (ref >=60)
Globulin: 2.3 g/dL (calc) (ref 1.9–3.7)
Glucose, Bld: 118 mg/dL — ABNORMAL HIGH (ref 65–99)
Potassium: 4.1 mmol/L (ref 3.5–5.3)
Sodium: 141 mmol/L (ref 135–146)
Total Bilirubin: 0.6 mg/dL (ref 0.2–1.2)
Total Protein: 6.7 g/dL (ref 6.1–8.1)

## 2019-04-19 LAB — CBC WITH DIFFERENTIAL/PLATELET
Absolute Monocytes: 505 cells/uL (ref 200–950)
Basophils Absolute: 52 cells/uL (ref 0–200)
Basophils Relative: 0.9 %
Eosinophils Absolute: 110 cells/uL (ref 15–500)
Eosinophils Relative: 1.9 %
HCT: 37.5 % (ref 35.0–45.0)
Hemoglobin: 12.4 g/dL (ref 11.7–15.5)
Lymphs Abs: 1711 cells/uL (ref 850–3900)
MCH: 30.2 pg (ref 27.0–33.0)
MCHC: 33.1 g/dL (ref 32.0–36.0)
MCV: 91.2 fL (ref 80.0–100.0)
MPV: 10.5 fL (ref 7.5–12.5)
Monocytes Relative: 8.7 %
Neutro Abs: 3422 cells/uL (ref 1500–7800)
Neutrophils Relative %: 59 %
Platelets: 177 10*3/uL (ref 140–400)
RBC: 4.11 10*6/uL (ref 3.80–5.10)
RDW: 14.2 % (ref 11.0–15.0)
Total Lymphocyte: 29.5 %
WBC: 5.8 10*3/uL (ref 3.8–10.8)

## 2019-04-19 LAB — URINALYSIS, ROUTINE W REFLEX MICROSCOPIC
Bilirubin Urine: NEGATIVE
Glucose, UA: NEGATIVE
Hgb urine dipstick: NEGATIVE
Ketones, ur: NEGATIVE
Leukocytes,Ua: NEGATIVE
Nitrite: NEGATIVE
Protein, ur: NEGATIVE
Specific Gravity, Urine: 1.019 (ref 1.001–1.03)
pH: 6.5 (ref 5.0–8.0)

## 2019-04-19 LAB — LIPID PANEL
Cholesterol: 189 mg/dL (ref <200)
HDL: 41 mg/dL — ABNORMAL LOW (ref >=50)
LDL Cholesterol (Calc): 118 mg/dL (calc) — ABNORMAL HIGH
Non-HDL Cholesterol (Calc): 148 mg/dL (calc) — ABNORMAL HIGH (ref <130)
Total CHOL/HDL Ratio: 4.6 (calc) (ref <5.0)
Triglycerides: 187 mg/dL — ABNORMAL HIGH (ref <150)

## 2019-04-19 LAB — HEMOGLOBIN A1C
Hgb A1c MFr Bld: 6.4 % of total Hgb — ABNORMAL HIGH (ref <5.7)
Mean Plasma Glucose: 137 (calc)
eAG (mmol/L): 7.6 (calc)

## 2019-04-19 LAB — CELIAC DISEASE COMPREHENSIVE PANEL WITH REFLEXES
(tTG) Ab, IgA: 1 U/mL
Immunoglobulin A: 199 mg/dL (ref 70–320)

## 2019-04-19 LAB — MICROALBUMIN / CREATININE URINE RATIO
Creatinine, Urine: 107 mg/dL (ref 20–275)
Microalb Creat Ratio: 2 mcg/mg creat (ref <30)
Microalb, Ur: 0.2 mg/dL

## 2019-04-19 LAB — MAGNESIUM: Magnesium: 2 mg/dL (ref 1.5–2.5)

## 2019-04-19 LAB — URIC ACID: Uric Acid, Serum: 5.5 mg/dL (ref 2.5–7.0)

## 2019-04-19 LAB — TSH: TSH: 3.14 mIU/L (ref 0.40–4.50)

## 2019-04-19 LAB — INSULIN, RANDOM: Insulin: 14.8 u[IU]/mL

## 2019-04-19 LAB — VITAMIN B12: Vitamin B-12: 1217 pg/mL — ABNORMAL HIGH (ref 200–1100)

## 2019-04-19 LAB — VITAMIN D 25 HYDROXY (VIT D DEFICIENCY, FRACTURES): Vit D, 25-Hydroxy: 74 ng/mL (ref 30–100)

## 2019-04-19 MED ORDER — ROSUVASTATIN CALCIUM 10 MG PO TABS: ORAL_TABLET | ORAL | 1 refills | Status: DC

## 2019-04-19 NOTE — Addendum Note (Signed)
Addended by: Eulis Canner on: 04/19/2019 02:19 PM   Modules accepted: Orders

## 2019-04-21 LAB — GASTROINTESTINAL PATHOGEN PANEL PCR
C. difficile Tox A/B, PCR: NOT DETECTED
Campylobacter, PCR: NOT DETECTED
Cryptosporidium, PCR: NOT DETECTED
E coli (ETEC) LT/ST PCR: NOT DETECTED
E coli (STEC) stx1/stx2, PCR: NOT DETECTED
E coli 0157, PCR: NOT DETECTED
Giardia lamblia, PCR: NOT DETECTED
Norovirus, PCR: NOT DETECTED
Rotavirus A, PCR: NOT DETECTED
Salmonella, PCR: NOT DETECTED
Shigella, PCR: NOT DETECTED

## 2019-04-21 LAB — OVA AND PARASITE EXAMINATION
CONCENTRATE RESULT:: NONE SEEN
MICRO NUMBER:: 487742
SPECIMEN QUALITY:: ADEQUATE
TRICHROME RESULT:: NONE SEEN

## 2019-05-03 ENCOUNTER — Other Ambulatory Visit: Payer: Self-pay

## 2019-05-03 ENCOUNTER — Telehealth: Payer: Self-pay | Admitting: Physician Assistant

## 2019-05-03 DIAGNOSIS — Z1211 Encounter for screening for malignant neoplasm of colon: Secondary | ICD-10-CM

## 2019-05-03 DIAGNOSIS — E348 Other specified endocrine disorders: Secondary | ICD-10-CM

## 2019-05-03 LAB — POC HEMOCCULT BLD/STL (HOME/3-CARD/SCREEN)
Card #2 Fecal Occult Blod, POC: NEGATIVE
Card #3 Fecal Occult Blood, POC: NEGATIVE
Fecal Occult Blood, POC: NEGATIVE

## 2019-05-03 NOTE — Telephone Encounter (Signed)
-----   Message from Elenor Quinones, [place] sent at 05/03/2019  4:00 PM EDT ----- Regarding: order There was an order for a DEXA scan, by mistake I released the order thinking it was the [facility] order that I had just put in.  I think I saw where you may have put that order. If it is not to much of a problem will you reenter the order. And then close the chart. I have already entered the results of her hemoccult test.  Please & thank you!

## 2019-05-04 DIAGNOSIS — Z1211 Encounter for screening for malignant neoplasm of colon: Secondary | ICD-10-CM

## 2019-05-11 ENCOUNTER — Other Ambulatory Visit: Payer: Self-pay | Admitting: Physician Assistant

## 2019-05-11 DIAGNOSIS — E2839 Other primary ovarian failure: Secondary | ICD-10-CM

## 2019-05-12 ENCOUNTER — Other Ambulatory Visit: Payer: Self-pay

## 2019-05-12 ENCOUNTER — Ambulatory Visit
Admission: RE | Admit: 2019-05-12 | Discharge: 2019-05-12 | Disposition: A | Discharge status: Home / Self Care | Payer: PPO | Source: Ambulatory Visit | Attending: Physician Assistant | Admitting: Physician Assistant

## 2019-05-12 DIAGNOSIS — Z780 Asymptomatic menopausal state: Secondary | ICD-10-CM | POA: Diagnosis not present

## 2019-05-12 DIAGNOSIS — E2839 Other primary ovarian failure: Secondary | ICD-10-CM

## 2019-05-12 DIAGNOSIS — M85851 Other specified disorders of bone density and structure, right thigh: Secondary | ICD-10-CM | POA: Diagnosis not present

## 2019-05-30 ENCOUNTER — Other Ambulatory Visit: Payer: Self-pay

## 2019-05-30 ENCOUNTER — Ambulatory Visit (INDEPENDENT_AMBULATORY_CARE_PROVIDER_SITE_OTHER): Payer: PPO

## 2019-05-30 DIAGNOSIS — Z79899 Other long term (current) drug therapy: Secondary | ICD-10-CM

## 2019-05-30 DIAGNOSIS — B351 Tinea unguium: Secondary | ICD-10-CM | POA: Diagnosis not present

## 2019-05-30 NOTE — Progress Notes (Signed)
Patient presents to the office for a nurse visit to have labs done to check liver function. No changes with medications and no questions or concerns. Vitals taken and recorded.

## 2019-05-31 LAB — HEPATIC FUNCTION PANEL
AG Ratio: 1.7 (calc) (ref 1.0–2.5)
ALT: 11 U/L (ref 6–29)
AST: 20 U/L (ref 10–35)
Albumin: 4.3 g/dL (ref 3.6–5.1)
Alkaline phosphatase (APISO): 85 U/L (ref 37–153)
Bilirubin, Direct: 0.1 mg/dL (ref 0.0–0.2)
Globulin: 2.6 g/dL (calc) (ref 1.9–3.7)
Indirect Bilirubin: 0.3 mg/dL (calc) (ref 0.2–1.2)
Total Bilirubin: 0.4 mg/dL (ref 0.2–1.2)
Total Protein: 6.9 g/dL (ref 6.1–8.1)

## 2019-06-21 ENCOUNTER — Other Ambulatory Visit: Payer: Self-pay | Admitting: Internal Medicine

## 2019-07-20 DIAGNOSIS — N183 Chronic kidney disease, stage 3 (moderate): Secondary | ICD-10-CM | POA: Diagnosis not present

## 2019-07-20 DIAGNOSIS — C50212 Malignant neoplasm of upper-inner quadrant of left female breast: Secondary | ICD-10-CM | POA: Diagnosis not present

## 2019-07-20 DIAGNOSIS — I129 Hypertensive chronic kidney disease with stage 1 through stage 4 chronic kidney disease, or unspecified chronic kidney disease: Secondary | ICD-10-CM | POA: Diagnosis not present

## 2019-07-20 DIAGNOSIS — E559 Vitamin D deficiency, unspecified: Secondary | ICD-10-CM | POA: Diagnosis not present

## 2019-07-20 DIAGNOSIS — Z170 Estrogen receptor positive status [ER+]: Secondary | ICD-10-CM | POA: Diagnosis not present

## 2019-07-20 DIAGNOSIS — M109 Gout, unspecified: Secondary | ICD-10-CM | POA: Diagnosis not present

## 2019-07-20 DIAGNOSIS — E1122 Type 2 diabetes mellitus with diabetic chronic kidney disease: Secondary | ICD-10-CM | POA: Diagnosis not present

## 2019-07-20 NOTE — Progress Notes (Signed)
FOLLOW UP  Assessment and Plan:   Hypertension Well controlled with current medications  Monitor blood pressure at home; patient to call if consistently greater than 130/80 Continue DASH diet.   Reminder to go to the ER if any CP, SOB, nausea, dizziness, severe HA, changes vision/speech, left arm numbness and tingling and jaw pain.  Cholesterol Treated by lifestyle and zetia; newly on rosuvastatin 5 mg three days a week- caution as had to stop atorvastatin due to LFT elevation previously Continue low cholesterol diet and exercise.  Check lipid panel.   Diabetes with diabetic chronic kidney disease Currently with slow weight loss and A1Cs in prediabetic range; consider reduction of metformin if continues in light of renal functions Transition to glipizide/glimepiride if further decline Continue diet and exercise.  Perform daily foot/skin check, notify office of any concerning changes.  Check A1C  Diabetic neuropathy, painful (HCC)  Currently prescribed cymbalta during the day, gabapentin 100-300 mg at night.   Vitamin D Def/ osteoporosis prevention At goal at recent check; continue to recommend supplementation for goal of 60-100 Check vitamin D level due to request from nephrology and will forward results   Continue diet and meds as discussed. Further disposition pending results of labs. Discussed med's effects and SE's.   Over 30 minutes of exam, counseling, chart review, and critical decision making was performed.   Future Appointments  Date Time Provider [place]  09/15/2019  8:45 AM Kayla Lose, MD [facility] None  10/25/2019  9:30 AM Kayla Pinto, MD [facility] None  01/24/2020 11:15 AM Kayla Mutters, PA-C [facility] None  04/18/2020  9:00 AM Kayla Pinto, MD [facility] None    ----------------------------------------------------------------------------------------------------------------------  HPI 83 y.o. female  presents for 3 month follow up  on hypertension, cholesterol, diabetes and vitamin D deficiency. [name] Patient has hx of [facility]la Breast Ca (DCIS) March 2017 s/p Lt Lumpectomy - completed ChemoRadiation and since then on Anastrozole per Kayla Baker.   She has stopped magnesium supplement and reports previous 4-5 episodes of diarrhea daily has fully resolved.    BMI is Body mass index is 24.72 kg/m., she has been working on diet and exercise. Wt Readings from Last 3 Encounters:  07/25/19 144 lb (65.3 kg)  05/30/19 145 lb (65.8 kg)  04/18/19 146 lb 12.8 oz (66.6 kg)   Her blood pressure has been controlled at home, today their BP is BP: 116/64  She does not currently workout on a consistent basis due to ongoing neuropathy- she is prescribed cymbalta and gabapentin for neuropathy. She denies chest pain, shortness of breath, dizziness.   She is on cholesterol medication (zetia, newly on rosuvastatin 5 mg three days a week) and denies myalgias. She reports she was previously on atorvastatin but this was discontinued secondary to elevated LFTs. Her cholesterol is not at goal. The cholesterol last visit was:   Lab Results  Component Value Date   CHOL 189 04/18/2019   HDL 41 (Kayla) 04/18/2019   LDLCALC 118 (H) 04/18/2019   TRIG 187 (H) 04/18/2019   CHOLHDL 4.6 04/18/2019   Lab Results  Component Value Date   ALT 11 05/30/2019   AST 20 05/30/2019   ALKPHOS 103 05/04/2017   BILITOT 0.4 05/30/2019    She has been working on diet and exercise for T2 diabetes (on metformin 500 mg BID), and denies increased appetite, nausea, polydipsia, polyuria, visual disturbances, vomiting and weight loss. She does check fasting glucose, ranges 90-120s. Last A1C  in the office was:  Lab Results  Component Value Date   HGBA1C 6.4 (H) 04/18/2019   Monitoring closely due to metformin therapy, follows with Kayla. Hollie Baker Lab Results  Component Value Date   GFRNONAA 15 (Kayla) 04/18/2019   Patient is on  Vitamin D supplement and above goal at the last check:   Lab Results  Component Value Date   VD25OH 74 04/18/2019     Patient is on allopurinol for gout and does not report a recent flare.  Lab Results  Component Value Date   LABURIC 5.5 04/18/2019      Current Medications:  Current Outpatient Medications on File Prior to Visit  Medication Sig  . acetaminophen (TYLENOL) 325 MG tablet Take 325 mg by mouth every 6 (six) hours as needed for headache.  . allopurinol (ZYLOPRIM) 300 MG tablet TAKE 1 TABLET(300 MG) BY MOUTH DAILY  . anastrozole (ARIMIDEX) 1 MG tablet Take 1 tablet (1 mg total) by mouth daily.  Marland Kitchen aspirin 81 MG tablet Take 81 mg by mouth daily.  . Cholecalciferol (VITAMIN D3) 5000 units CAPS Take 5,000 Units by mouth daily.  Marland Kitchen ezetimibe (ZETIA) 10 MG tablet Take 1 tablet Daily for Cholesterol  . FREESTYLE TEST STRIPS test strip TEST BLOOD SUGAR ONCE DAILY AS DIRECTED  . gabapentin (NEURONTIN) 300 MG capsule Take 1 capsule 3 to 4  x /day as needed for pain  . hydrochlorothiazide (HYDRODIURIL) 25 MG tablet TAKE ONE TABLET BY MOUTH DAILY  . Lancets (FREESTYLE) lancets USE TO CHECK BLOOD SUGAR DAILY AS DIRECTED  . loratadine (CLARITIN) 10 MG tablet Take 10 mg by mouth daily as needed for allergies.  . metFORMIN (GLUCOPHAGE-XR) 500 MG 24 hr tablet TAKE 4 TABLETS BY MOUTH DAILY FOR BLOOD SUGAR  . metoprolol succinate (TOPROL-XL) 25 MG 24 hr tablet TAKE 1 TABLET(25 MG) BY MOUTH DAILY  . rosuvastatin (CRESTOR) 10 MG tablet Take 1 tablet Daily for Cholesterol (Patient taking differently: Takes 1/2 tablet three times a week)  . vitamin C (ASCORBIC ACID) 500 MG tablet Take 500 mg by mouth daily. Takes 2 tablets daily  . Magnesium ([employer] MAGNESIUM) 250 MG TABS Take 500 mg by mouth 2 (two) times daily.   No current facility-administered medications on file prior to visit.      Allergies:  Allergies  Allergen Reactions  . Keflex [Cephalexin] Swelling    Tongue and throat swells  .  Meloxicam Swelling  . Prednisone Swelling    Can Not take High doses  . Premarin [Estrogens Conjugated] Hives  . Trovan [Alatrofloxacin] Other (See Comments)    Unknown reaction     Medical History:  Past Medical History:  Diagnosis Date  . Allergy   . Anemia   . Arthritis   . Blood transfusion without reported diagnosis   . Breast cancer of upper-inner quadrant of left female breast ([place]) 02/29/2016   skin- 2016- squamous- on right upper arm  . Diet-controlled type 2 diabetes mellitus ([place])    diet and exercsise,Has never been on meds, type II   . GERD (gastroesophageal reflux disease)   . Gout    takes Allopurinol daily  . History of chemotherapy   . History of colon polyps    benign  . History of shingles   . Hx of radiation therapy   . Hyperlipidemia   . Hypertension   . Personal history of chemotherapy    2017  . Personal history of radiation therapy    2017  . Vitamin  D deficiency    Family history- Reviewed and unchanged Social history- Reviewed and unchanged   Review of Systems:  Review of Systems  Constitutional: Negative for malaise/fatigue and weight loss.  HENT: Negative for hearing loss and tinnitus.   Eyes: Negative for blurred vision and double vision.  Respiratory: Negative for cough, shortness of breath and wheezing.   Cardiovascular: Negative for chest pain, palpitations, orthopnea, claudication and leg swelling.  Gastrointestinal: Negative for abdominal pain, blood in stool, constipation, diarrhea, heartburn, melena, nausea and vomiting.  Genitourinary: Negative.   Musculoskeletal: Negative for joint pain and myalgias.  Skin: Negative for rash.  Neurological: Negative for dizziness, tingling (Consistent with baseline in bilateral feet), sensory change, weakness and headaches.  Endo/Heme/Allergies: Negative for polydipsia.  Psychiatric/Behavioral: Negative.  The patient does not have insomnia.   All other systems reviewed and are  negative.   Physical Exam: BP 116/64   Pulse 68   Temp (!) 97.5 F (36.4 C)   Wt 144 lb (65.3 kg)   SpO2 96%   BMI 24.72 kg/m  Wt Readings from Last 3 Encounters:  07/25/19 144 lb (65.3 kg)  05/30/19 145 lb (65.8 kg)  04/18/19 146 lb 12.8 oz (66.6 kg)   General Appearance: Well nourished, in no apparent distress. Eyes: PERRLA, EOMs, conjunctiva no swelling or erythema Sinuses: No Frontal/maxillary tenderness ENT/Mouth: Ext aud canals clear, TMs without erythema, bulging. No erythema, swelling, or exudate on post pharynx.  Tonsils not swollen or erythematous. Hearing normal.  Neck: Supple, thyroid normal.  Respiratory: Respiratory effort normal, BS equal bilaterally without rales, rhonchi, wheezing or stridor.  Cardio: RRR with no MRGs. Brisk peripheral pulses without edema.  Abdomen: Soft, + BS.  Non tender, no guarding, rebound, hernias, masses. Lymphatics: Non tender without lymphadenopathy.  Musculoskeletal: Full ROM, 5/5 strength, Normal gait Skin: Warm, dry without rashes, lesions, ecchymosis.  Neuro: Cranial nerves intact. No cerebellar symptoms.  Psych: Awake and oriented X 3, normal affect, Insight and Judgment appropriate.    Kayla Ribas, NP 9:05 AM [facility]

## 2019-07-25 ENCOUNTER — Encounter: Payer: Self-pay | Admitting: Adult Health

## 2019-07-25 ENCOUNTER — Ambulatory Visit (INDEPENDENT_AMBULATORY_CARE_PROVIDER_SITE_OTHER): Payer: PPO | Admitting: Adult Health

## 2019-07-25 ENCOUNTER — Other Ambulatory Visit: Payer: Self-pay

## 2019-07-25 VITALS — BP 116/64 | HR 68 | Temp 97.5°F | Wt 144.0 lb

## 2019-07-25 DIAGNOSIS — D225 Melanocytic nevi of trunk: Secondary | ICD-10-CM | POA: Diagnosis not present

## 2019-07-25 DIAGNOSIS — Z79899 Other long term (current) drug therapy: Secondary | ICD-10-CM | POA: Diagnosis not present

## 2019-07-25 DIAGNOSIS — E1169 Type 2 diabetes mellitus with other specified complication: Secondary | ICD-10-CM | POA: Diagnosis not present

## 2019-07-25 DIAGNOSIS — I10 Essential (primary) hypertension: Secondary | ICD-10-CM | POA: Diagnosis not present

## 2019-07-25 DIAGNOSIS — M1000 Idiopathic gout, unspecified site: Secondary | ICD-10-CM | POA: Diagnosis not present

## 2019-07-25 DIAGNOSIS — N183 Chronic kidney disease, stage 3 unspecified: Secondary | ICD-10-CM

## 2019-07-25 DIAGNOSIS — E785 Hyperlipidemia, unspecified: Secondary | ICD-10-CM | POA: Diagnosis not present

## 2019-07-25 DIAGNOSIS — E1122 Type 2 diabetes mellitus with diabetic chronic kidney disease: Secondary | ICD-10-CM

## 2019-07-25 DIAGNOSIS — E559 Vitamin D deficiency, unspecified: Secondary | ICD-10-CM

## 2019-07-25 DIAGNOSIS — L821 Other seborrheic keratosis: Secondary | ICD-10-CM | POA: Diagnosis not present

## 2019-07-25 DIAGNOSIS — Z85828 Personal history of other malignant neoplasm of skin: Secondary | ICD-10-CM | POA: Diagnosis not present

## 2019-07-25 DIAGNOSIS — L814 Other melanin hyperpigmentation: Secondary | ICD-10-CM | POA: Diagnosis not present

## 2019-07-25 MED ORDER — ALLOPURINOL 300 MG PO TABS: 150.0000 mg | ORAL_TABLET | Freq: Every day | ORAL | 1 refills | Status: DC

## 2019-07-25 MED ORDER — METFORMIN HCL ER 500 MG PO TB24: 500.0000 mg | ORAL_TABLET | Freq: Two times a day (BID) | ORAL | 1 refills | Status: DC

## 2019-07-25 MED ORDER — ROSUVASTATIN CALCIUM 5 MG PO TABS: ORAL_TABLET | ORAL | 1 refills | Status: DC

## 2019-07-25 NOTE — Patient Instructions (Addendum)
Goals    . HEMOGLOBIN A1C < 7    . LDL CALC < 70       Preventing Type 2 Diabetes Mellitus Type 2 diabetes (type 2 diabetes mellitus) is a long-term (chronic) disease that affects blood sugar (glucose) levels. Normally, a hormone called insulin allows glucose to enter cells in the body. The cells use glucose for energy. In type 2 diabetes, one or both of these problems may be present:  The body does not make enough insulin.  The body does not respond properly to insulin that it makes (insulin resistance). Insulin resistance or lack of insulin causes excess glucose to build up in the blood instead of going into cells. As a result, high blood glucose (hyperglycemia) develops, which can cause many complications. Being overweight or obese and having an inactive (sedentary) lifestyle can increase your risk for diabetes. Type 2 diabetes can be delayed or prevented by making certain nutrition and lifestyle changes. What nutrition changes can be made?   Eat healthy meals and snacks regularly. Keep a healthy snack with you for when you get hungry between meals, such as fruit or a handful of nuts.  Eat lean meats and proteins that are low in saturated fats, such as chicken, fish, egg whites, and beans. Avoid processed meats.  Eat plenty of fruits and vegetables and plenty of grains that have not been processed (whole grains). It is recommended that you eat: ? 1?2 cups of fruit every day. ? 2?3 cups of vegetables every day. ? 6?8 oz of whole grains every day, such as oats, whole wheat, bulgur, brown rice, quinoa, and millet.  Eat low-fat dairy products, such as milk, yogurt, and cheese.  Eat foods that contain healthy fats, such as nuts, avocado, olive oil, and canola oil.  Drink water throughout the day. Avoid drinks that contain added sugar, such as soda or sweet tea.  Follow instructions from your health care provider about specific eating or drinking restrictions.  Control how much food  you eat at a time (portion size). ? Check food labels to find out the serving sizes of foods. ? Use a kitchen scale to weigh amounts of foods.  Saute or steam food instead of frying it. Cook with water or broth instead of oils or butter.  Limit your intake of: ? Salt (sodium). Have no more than 1 tsp (2,400 mg) of sodium a day. If you have heart disease or high blood pressure, have less than ? tsp (1,500 mg) of sodium a day. ? Saturated fat. This is fat that is solid at room temperature, such as butter or fat on meat. What lifestyle changes can be made? Activity   Do moderate-intensity physical activity for at least 30 minutes on at least 5 days of the week, or as much as told by your health care provider.  Ask your health care provider what activities are safe for you. A mix of physical activities may be best, such as walking, swimming, cycling, and strength training.  Try to add physical activity into your day. For example: ? Park in spots that are farther away than usual, so that you walk more. For example, park in a far corner of the parking lot when you go to the office or the grocery store. ? Take a walk during your lunch break. ? Use stairs instead of elevators or escalators. Weight Loss  Lose weight as directed. Your health care provider can determine how much weight loss is best for you and  can help you lose weight safely.  If you are overweight or obese, you may be instructed to lose at least 5?7 % of your body weight. Alcohol and Tobacco   Limit alcohol intake to no more than 1 drink a day for nonpregnant women and 2 drinks a day for men. One drink equals 12 oz of beer, 5 oz of wine, or 1 oz of hard liquor.  Do not use any tobacco products, such as cigarettes, chewing tobacco, and e-cigarettes. If you need help quitting, ask your health care provider. Work With [place] Provider  Have your blood glucose tested regularly, as told by your health care  provider.  Discuss your risk factors and how you can reduce your risk for diabetes.  Get screening tests as told by your health care provider. You may have screening tests regularly, especially if you have certain risk factors for type 2 diabetes.  Make an appointment with a diet and nutrition specialist (registered dietitian). A registered dietitian can help you make a healthy eating plan and can help you understand portion sizes and food labels. Why are these changes important?  It is possible to prevent or delay type 2 diabetes and related health problems by making lifestyle and nutrition changes.  It can be difficult to recognize signs of type 2 diabetes. The best way to avoid possible damage to your body is to take actions to prevent the disease before you develop symptoms. What can happen if changes are not made?  Your blood glucose levels may keep increasing. Having high blood glucose for a long time is dangerous. Too much glucose in your blood can damage your blood vessels, heart, kidneys, nerves, and eyes.  You may develop prediabetes or type 2 diabetes. Type 2 diabetes can lead to many chronic health problems and complications, such as: ? Heart disease. ? Stroke. ? Blindness. ? Kidney disease. ? Depression. ? Poor circulation in the feet and legs, which could lead to surgical removal (amputation) in severe cases. Where to find support  Ask your health care provider to recommend a registered dietitian, diabetes educator, or weight loss program.  Look for local or online weight loss groups.  Join a gym, fitness club, or outdoor activity group, such as a walking club. Where to find more information To learn more about diabetes and diabetes prevention, visit:  American Diabetes Association (ADA): www.diabetes.[employer] of Diabetes and Digestive and Kidney Diseases: FindSpin.nl To learn more about healthy eating, visit:  The  U.[place]. Department of Agriculture Scientist, research (physical sciences)), Choose My Plate: http://wiley-williams.com/  Office of Disease Prevention and Health Promotion (ODPHP), Dietary Guidelines: SurferLive.at Summary  You can reduce your risk for type 2 diabetes by increasing your physical activity, eating healthy foods, and losing weight as directed.  Talk with your health care provider about your risk for type 2 diabetes. Ask about any blood tests or screening tests that you need to have. This information is not intended to replace advice given to you by your health care provider. Make sure you discuss any questions you have with your health care provider. Document Released: 03/10/2016 Document Revised: 03/11/2019 Document Reviewed: 01/08/2016 Elsevier Patient Education  2020 [employer].

## 2019-07-26 LAB — CBC WITH DIFFERENTIAL/PLATELET
Absolute Monocytes: 462 cells/uL (ref 200–950)
Basophils Absolute: 51 cells/uL (ref 0–200)
Basophils Relative: 0.9 %
Eosinophils Absolute: 239 cells/uL (ref 15–500)
Eosinophils Relative: 4.2 %
HCT: 38.6 % (ref 35.0–45.0)
Hemoglobin: 12.3 g/dL (ref 11.7–15.5)
Lymphs Abs: 1568 cells/uL (ref 850–3900)
MCH: 29.4 pg (ref 27.0–33.0)
MCHC: 31.9 g/dL — ABNORMAL LOW (ref 32.0–36.0)
MCV: 92.3 fL (ref 80.0–100.0)
MPV: 10.5 fL (ref 7.5–12.5)
Monocytes Relative: 8.1 %
Neutro Abs: 3380 cells/uL (ref 1500–7800)
Neutrophils Relative %: 59.3 %
Platelets: 155 10*3/uL (ref 140–400)
RBC: 4.18 10*6/uL (ref 3.80–5.10)
RDW: 14.4 % (ref 11.0–15.0)
Total Lymphocyte: 27.5 %
WBC: 5.7 10*3/uL (ref 3.8–10.8)

## 2019-07-26 LAB — COMPLETE METABOLIC PANEL WITH GFR
AG Ratio: 1.6 (calc) (ref 1.0–2.5)
ALT: 22 U/L (ref 6–29)
AST: 29 U/L (ref 10–35)
Albumin: 4.3 g/dL (ref 3.6–5.1)
Alkaline phosphatase (APISO): 101 U/L (ref 37–153)
BUN/Creatinine Ratio: 19 (calc) (ref 6–22)
BUN: 24 mg/dL (ref 7–25)
CO2: 28 mmol/L (ref 20–32)
Calcium: 10.7 mg/dL — ABNORMAL HIGH (ref 8.6–10.4)
Chloride: 106 mmol/L (ref 98–110)
Creat: 1.28 mg/dL — ABNORMAL HIGH (ref 0.60–0.88)
GFR, Est African American: 45 mL/min/{1.73_m2} — ABNORMAL LOW (ref >=60)
GFR, Est Non African American: 39 mL/min/{1.73_m2} — ABNORMAL LOW (ref >=60)
Globulin: 2.7 g/dL (calc) (ref 1.9–3.7)
Glucose, Bld: 117 mg/dL — ABNORMAL HIGH (ref 65–99)
Potassium: 4.2 mmol/L (ref 3.5–5.3)
Sodium: 141 mmol/L (ref 135–146)
Total Bilirubin: 0.4 mg/dL (ref 0.2–1.2)
Total Protein: 7 g/dL (ref 6.1–8.1)

## 2019-07-26 LAB — LIPID PANEL
Cholesterol: 164 mg/dL (ref <200)
HDL: 46 mg/dL — ABNORMAL LOW (ref >=50)
LDL Cholesterol (Calc): 97 mg/dL (calc)
Non-HDL Cholesterol (Calc): 118 mg/dL (calc) (ref <130)
Total CHOL/HDL Ratio: 3.6 (calc) (ref <5.0)
Triglycerides: 117 mg/dL (ref <150)

## 2019-07-26 LAB — HEMOGLOBIN A1C
Hgb A1c MFr Bld: 6.4 % of total Hgb — ABNORMAL HIGH (ref <5.7)
Mean Plasma Glucose: 137 (calc)
eAG (mmol/L): 7.6 (calc)

## 2019-07-26 LAB — TSH: TSH: 2.44 mIU/L (ref 0.40–4.50)

## 2019-07-26 LAB — VITAMIN D 25 HYDROXY (VIT D DEFICIENCY, FRACTURES): Vit D, 25-Hydroxy: 68 ng/mL (ref 30–100)

## 2019-07-26 LAB — MAGNESIUM: Magnesium: 1.5 mg/dL (ref 1.5–2.5)

## 2019-07-31 ENCOUNTER — Other Ambulatory Visit: Payer: Self-pay | Admitting: Internal Medicine

## 2019-08-24 ENCOUNTER — Other Ambulatory Visit: Payer: Self-pay | Admitting: *Deleted

## 2019-08-24 DIAGNOSIS — E1140 Type 2 diabetes mellitus with diabetic neuropathy, unspecified: Secondary | ICD-10-CM

## 2019-08-24 MED ORDER — GABAPENTIN 300 MG PO CAPS: ORAL_CAPSULE | ORAL | 3 refills | Status: DC

## 2019-09-14 NOTE — Progress Notes (Signed)
Patient Care Team: [name], MD as PCP - Kayla Baker, OD as Referring Physician (Optometry) [name], MD as Consulting Physician (Dermatology) Latanya Maudlin, MD as Consulting Physician (Orthopedic Surgery) Inda Castle, MD (Inactive) as Consulting Physician (Gastroenterology) [name], MD as Consulting Physician (Hematology and Oncology)  DIAGNOSIS:    ICD-10-CM   1. Malignant neoplasm of upper-inner quadrant of left breast in female, estrogen receptor positive ([place])  C50.212    Z17.0     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[place])  02/26/2016 Initial Diagnosis   Left breast biopsy 11:00 position: invasive ductal carcinoma with DCIS, ER 90%, PR 10%, HER-2 negative, Ki-67 30%, grade 2, 2.2 cm palpable lesion T2 N0 stage II a clinical stage   03/18/2016 Surgery   Left lumpectomy: Invasive ductal carcinoma, grade 2, 6.3 cm, with high-grade DCIS, margins negative, 0/4 lymph nodes negative, ER 90%, via 10%, HER-2 negative ratio 0.97, Ki-67 30%, T3 N0 stage IIB   03/25/2016 Procedure   Genetic testing is negative for pathogenic mutations within any of the 20 Genes on the breast/ovarian cancer panel   04/04/2016 Oncotype testing   Oncotype DX recurrence score 37, 25% 10 year distant risk of recurrence   04/17/2016 - 07/31/2016 Chemotherapy   Adjuvant chemotherapy with dose dense Adriamycin and Cytoxan followed by Abraxane weekly 8 ( discontinued due to neuropathy)   09/01/2016 - 09/26/2016 Radiation Therapy   Adj XRT 1) Left breast: 42.5 Gy in 17 fractions. 2) Left breast boost: 7.5 Gy in 3 fractions.   12/02/2016 -  Anti-estrogen oral therapy   Anastrozole 1 mg daily     CHIEF COMPLIANT: Follow-up of left breast cancer on Anastrozole  INTERVAL HISTORY: Kayla Baker is a 83 y.o. with above-mentioned history of left breast cancer treated with lumpectomy followed by adjuvant chemotherapy and  radiation and who is currently on anastrozole therapy. I last saw her a year ago. Mammogram on 03/03/19 showed no evidence of malignancy bilaterally. Bone density scan on 05/12/19 showed osteopenia with a T-score of -1.6 at the femur neck right. She presents to the clinic today for annual follow-up.  She is complaining of arthralgias and myalgias especially in her right knee and her left shoulder.  It could be arthritis but it could also be related to antiestrogen therapy.  She also has frequent hot flashes.  She currently is on gabapentin for neuropathy.  REVIEW OF SYSTEMS:   Constitutional: Denies fevers, chills or abnormal weight loss Eyes: Denies blurriness of vision Ears, nose, mouth, throat, and face: Denies mucositis or sore throat Respiratory: Denies cough, dyspnea or wheezes Cardiovascular: Denies palpitation, chest discomfort Gastrointestinal: Denies nausea, heartburn or change in bowel habits Skin: Denies abnormal skin rashes Lymphatics: Denies new lymphadenopathy or easy bruising Neurological: Denies numbness, tingling or new weaknesses Behavioral/Psych: Mood is stable, no new changes  Extremities: Muscle aches and pains and stiffness especially in the knee and shoulder Breast: denies any pain or lumps or nodules in either breasts All other systems were reviewed with the patient and are negative.  I have reviewed the past medical history, past surgical history, social history and family history with the patient and they are unchanged from previous note.  ALLERGIES:  is allergic to keflex [cephalexin]; meloxicam; prednisone; premarin [estrogens conjugated]; and trovan [alatrofloxacin].  MEDICATIONS:  Current Outpatient Medications  Medication Sig Dispense Refill  . acetaminophen (TYLENOL) 325 MG tablet Take 325 mg by mouth every  6 (six) hours as needed for headache.    . allopurinol (ZYLOPRIM) 300 MG tablet Take 0.5 tablets (150 mg total) by mouth daily. 90 tablet 1  . anastrozole  (ARIMIDEX) 1 MG tablet Take 1 tablet (1 mg total) by mouth daily. 90 tablet 3  . aspirin 81 MG tablet Take 81 mg by mouth daily.    . Cholecalciferol (VITAMIN D3) 5000 units CAPS Take 5,000 Units by mouth daily.    Marland Kitchen ezetimibe (ZETIA) 10 MG tablet Take 1 tablet Daily for Cholesterol 90 tablet 3  . FREESTYLE TEST STRIPS test strip TEST BLOOD SUGAR ONCE DAILY AS DIRECTED 100 each 3  . gabapentin (NEURONTIN) 300 MG capsule Take 1 capsule 3 to 4  x /day as needed for pain 360 capsule 3  . hydrochlorothiazide (HYDRODIURIL) 25 MG tablet TAKE ONE TABLET BY MOUTH DAILY 90 tablet 1  . Lancets (FREESTYLE) lancets CHECK BLOOD SUGAR DAILY AS DIRECTED 100 each 3  . loratadine (CLARITIN) 10 MG tablet Take 10 mg by mouth daily as needed for allergies.    . Magnesium ([employer] MAGNESIUM) 250 MG TABS Take 500 mg by mouth 2 (two) times daily.    . metFORMIN (GLUCOPHAGE-XR) 500 MG 24 hr tablet Take 1 tablet (500 mg total) by mouth 2 (two) times daily. 180 tablet 1  . metoprolol succinate (TOPROL-XL) 25 MG 24 hr tablet TAKE 1 TABLET(25 MG) BY MOUTH DAILY 90 tablet 1  . rosuvastatin (CRESTOR) 5 MG tablet Take 5 mg tablet three times a week 90 tablet 1  . vitamin C (ASCORBIC ACID) 500 MG tablet Take 500 mg by mouth daily. Takes 2 tablets daily     No current facility-administered medications for this visit.     PHYSICAL EXAMINATION: ECOG PERFORMANCE STATUS: 1 - Symptomatic but completely ambulatory  Vitals:   09/15/19 0848  BP: 138/64  Pulse: 70  Resp: 17  Temp: 98.2 F (36.8 C)  SpO2: 100%   Filed Weights   09/15/19 0848  Weight: 145 lb 11.2 oz (66.1 kg)    GENERAL: alert, no distress and comfortable SKIN: skin color, texture, turgor are normal, no rashes or significant lesions EYES: normal, Conjunctiva are pink and non-injected, sclera clear OROPHARYNX: no exudate, no erythema and lips, buccal mucosa, and tongue normal  NECK: supple, thyroid normal size, non-tender, without nodularity LYMPH: no  palpable lymphadenopathy in the cervical, axillary or inguinal LUNGS: clear to auscultation and percussion with normal breathing effort HEART: regular rate & rhythm and no murmurs and no lower extremity edema ABDOMEN: abdomen soft, non-tender and normal bowel sounds MUSCULOSKELETAL: no cyanosis of digits and no clubbing  NEURO: alert & oriented x 3 with fluent speech, no focal motor/sensory deficits EXTREMITIES: No lower extremity edema BREAST: No palpable masses or nodules in either right or left breasts. No palpable axillary supraclavicular or infraclavicular adenopathy no breast tenderness or nipple discharge. (exam performed in the presence of a chaperone)  LABORATORY DATA:  I have reviewed the data as listed CMP Latest Ref Rng & Units 07/25/2019 05/30/2019 04/18/2019  Glucose 65 - 99 mg/dL 117(H) - 118(H)  BUN 7 - 25 mg/dL 24 - 27(H)  Creatinine 0.60 - 0.88 mg/dL 1.28(H) - 1.38(H)  Sodium 135 - 146 mmol/L 141 - 141  Potassium 3.5 - 5.3 mmol/L 4.2 - 4.1  Chloride 98 - 110 mmol/L 106 - 101  CO2 20 - 32 mmol/L 28 - 31  Calcium 8.6 - 10.4 mg/dL 10.7(H) - 10.7(H)  Total Protein 6.1 - 8.1  g/dL 7.0 6.9 6.7  Total Bilirubin 0.2 - 1.2 mg/dL 0.4 0.4 0.6  Alkaline Phos 33 - 130 U/L - - -  AST 10 - 35 U/L 29 20 24   ALT 6 - 29 U/L 22 11 22     Lab Results  Component Value Date   WBC 5.7 07/25/2019   HGB 12.3 07/25/2019   HCT 38.6 07/25/2019   MCV 92.3 07/25/2019   PLT 155 07/25/2019   NEUTROABS 3,380 07/25/2019    ASSESSMENT & PLAN:  Breast cancer of upper-inner quadrant of left female breast ([place]) Left lumpectomy 03/08/2016: Invasive ductal carcinoma, grade 2, 6.3 cm, with high-grade DCIS, margins negative, 0/4 lymph nodes negative, ER 90%, via 10%, HER-2 negative ratio 0.97, Ki-67 30%, T3 N0 stage IIB  Oncotype DX: Patient has a score of 37 which translates into 25% 10 year risk of distant recurrence with tamoxifen alone. Adjuvant chemotherapy with dose dense Adriamycin and Cytoxan 4  followed by Abraxane weekly 8 ( stopped due to neuropathy) 04/17/2016 to 07/31/2016 Adj XRT from 10/2/17to 09/26/16  Chemo induced neuropathy: Being monitored Current treatment: Anti estrogen therapy with Letrozole 2.5 mg daily started January2018  Anastrozole toxicities: 1. Severe hot flashes:Patient is currently on gabapentin along with Cymbalta. 2.  Muscle aches and pains especially in the right knee and left shoulder: We discussed different measures including stopping it for 2 weeks and seeing if it is related to the antiestrogen therapy or not.  The second option would be to take it at different time of the day and the third option would be to split it in half.  If none of these measures work then we may consider switching her to exemestane.  Patient will call us in a month and inform us if she wants to switch to exemestane.  Surveillance: 1.Breast exam 09/15/2019: Benign 2.mammogram   03/03/2019: Benign postsurgical changes, breast density category C  Return to clinic in 1year    No orders of the defined types were placed in this encounter.  The patient has a good understanding of the overall plan. she agrees with it. she will call with any problems that may develop before the next visit here.  [name], MD 09/15/2019  Julious Oka Dorshimer am acting as scribe for Dr. [name].  I have reviewed the above documentation for accuracy and completeness, and I agree with the above.

## 2019-09-15 ENCOUNTER — Other Ambulatory Visit: Payer: Self-pay

## 2019-09-15 ENCOUNTER — Inpatient Hospital Stay: Payer: PPO | Attending: Hematology and Oncology | Admitting: Hematology and Oncology

## 2019-09-15 DIAGNOSIS — N951 Menopausal and female climacteric states: Secondary | ICD-10-CM | POA: Insufficient documentation

## 2019-09-15 DIAGNOSIS — Z79811 Long term (current) use of aromatase inhibitors: Secondary | ICD-10-CM | POA: Insufficient documentation

## 2019-09-15 DIAGNOSIS — C50212 Malignant neoplasm of upper-inner quadrant of left female breast: Secondary | ICD-10-CM | POA: Diagnosis not present

## 2019-09-15 DIAGNOSIS — Z170 Estrogen receptor positive status [ER+]: Secondary | ICD-10-CM | POA: Diagnosis not present

## 2019-09-15 DIAGNOSIS — Z923 Personal history of irradiation: Secondary | ICD-10-CM | POA: Insufficient documentation

## 2019-09-15 NOTE — Assessment & Plan Note (Signed)
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 Adjuvant chemotherapy with dose dense Adriamycin and Cytoxan 4 followed by Abraxane weekly 8 ( stopped due to neuropathy) 04/17/2016 to 07/31/2016 Adj XRT from 10/2/17to 09/26/16  Chemo induced neuropathy: Being monitored Current treatment: Anti estrogen therapy with Letrozole 2.5 mg daily started January2018  Letrozole toxicities: 1. Severe hot flashes:Patient is currently on gabapentin along with Cymbalta.  Surveillance: 1.Breast exam 09/15/2019: Benign 2.mammogram   03/03/2019: Benign postsurgical changes, breast density category C  Return to clinic in 1year

## 2019-09-16 ENCOUNTER — Telehealth: Payer: Self-pay | Admitting: Hematology and Oncology

## 2019-09-16 NOTE — Telephone Encounter (Signed)
I talk with patient regarding schedule  

## 2019-09-17 ENCOUNTER — Other Ambulatory Visit: Payer: Self-pay | Admitting: Internal Medicine

## 2019-09-17 DIAGNOSIS — I10 Essential (primary) hypertension: Secondary | ICD-10-CM

## 2019-09-17 MED ORDER — METOPROLOL SUCCINATE ER 25 MG PO TB24: ORAL_TABLET | ORAL | 3 refills | Status: DC

## 2019-10-24 ENCOUNTER — Other Ambulatory Visit: Payer: Self-pay | Admitting: Internal Medicine

## 2019-10-25 ENCOUNTER — Encounter: Payer: Self-pay | Admitting: Internal Medicine

## 2019-10-25 ENCOUNTER — Ambulatory Visit (INDEPENDENT_AMBULATORY_CARE_PROVIDER_SITE_OTHER): Payer: PPO | Admitting: Internal Medicine

## 2019-10-25 ENCOUNTER — Other Ambulatory Visit: Payer: Self-pay

## 2019-10-25 VITALS — BP 134/80 | HR 72 | Temp 97.4°F | Resp 16 | Ht 64.0 in | Wt 143.8 lb

## 2019-10-25 DIAGNOSIS — E559 Vitamin D deficiency, unspecified: Secondary | ICD-10-CM

## 2019-10-25 DIAGNOSIS — N1831 Chronic kidney disease, stage 3a: Secondary | ICD-10-CM | POA: Diagnosis not present

## 2019-10-25 DIAGNOSIS — I10 Essential (primary) hypertension: Secondary | ICD-10-CM

## 2019-10-25 DIAGNOSIS — M1000 Idiopathic gout, unspecified site: Secondary | ICD-10-CM

## 2019-10-25 DIAGNOSIS — E785 Hyperlipidemia, unspecified: Secondary | ICD-10-CM | POA: Diagnosis not present

## 2019-10-25 DIAGNOSIS — E1122 Type 2 diabetes mellitus with diabetic chronic kidney disease: Secondary | ICD-10-CM

## 2019-10-25 DIAGNOSIS — Z79899 Other long term (current) drug therapy: Secondary | ICD-10-CM | POA: Diagnosis not present

## 2019-10-25 DIAGNOSIS — E1121 Type 2 diabetes mellitus with diabetic nephropathy: Secondary | ICD-10-CM

## 2019-10-25 DIAGNOSIS — E1169 Type 2 diabetes mellitus with other specified complication: Secondary | ICD-10-CM | POA: Diagnosis not present

## 2019-10-25 NOTE — Progress Notes (Signed)
History of Present Illness:      This very nice 83 y.o. MWF presents for 6 month follow up with HTN, HLD,  T2_NIDDM and Vitamin D Deficiency. Patient's Gout is controlled on her meds.      Patient is treated for HTN & BP has been controlled at home. Today's BP is at goal - 134/80. Patient has had no complaints of any cardiac type chest pain, palpitations, dyspnea / orthopnea / PND, dizziness, claudication, or dependent edema.      Hyperlipidemia is controlled with diet & Rosuvastatin / ezetimibe. Patient denies myalgias or other med SE's. Last Lipids were at goal:  Lab Results  Component Value Date   CHOL 164 07/25/2019   HDL 46 (L) 07/25/2019   LDLCALC 97 07/25/2019   TRIG 117 07/25/2019   CHOLHDL 3.6 07/25/2019        Also, the patient has history of T2_NIDDM and has had no symptoms of reactive hypoglycemia, diabetic polys, paresthesias or visual blurring.  Last A1c was not at goal:  Lab Results  Component Value Date   HGBA1C 6.4 (H) 07/25/2019        Further, the patient also has history of Vitamin D Deficiency and supplements vitamin D without any suspected side-effects. Last vitamin D was at goal:  Lab Results  Component Value Date   VD25OH 68 07/25/2019    Current Outpatient Medications on File Prior to Visit  Medication Sig  . acetaminophen (TYLENOL) 325 MG tablet Take 325 mg by mouth every 6 (six) hours as needed for headache.  . allopurinol (ZYLOPRIM) 300 MG tablet Take 1 tablet Daily to Prevent Gout  . anastrozole (ARIMIDEX) 1 MG tablet Take 1 tablet (1 mg total) by mouth daily.  Marland Kitchen aspirin 81 MG tablet Take 81 mg by mouth daily.  . Cholecalciferol (VITAMIN D3) 5000 units CAPS Take 5,000 Units by mouth daily.  Marland Kitchen ezetimibe (ZETIA) 10 MG tablet Take 1 tablet Daily for Cholesterol  . FREESTYLE TEST STRIPS test strip TEST BLOOD SUGAR ONCE DAILY AS DIRECTED  . gabapentin (NEURONTIN) 300 MG capsule Take 1 capsule 3 to 4  x /day as needed for pain  .  hydrochlorothiazide (HYDRODIURIL) 25 MG tablet TAKE ONE TABLET BY MOUTH DAILY  . Lancets (FREESTYLE) lancets CHECK BLOOD SUGAR DAILY AS DIRECTED  . loratadine (CLARITIN) 10 MG tablet Take 10 mg by mouth daily as needed for allergies.  . metFORMIN (GLUCOPHAGE-XR) 500 MG 24 hr tablet Take 1 tablet (500 mg total) by mouth 2 (two) times daily.  . metoprolol succinate (TOPROL-XL) 25 MG 24 hr tablet Take 1 tablet Daily for BP  . rosuvastatin (CRESTOR) 5 MG tablet Take 5 mg tablet three times a week  . vitamin C (ASCORBIC ACID) 500 MG tablet Take 500 mg by mouth daily. Takes 2 tablets daily  . Magnesium ([employer] MAGNESIUM) 250 MG TABS Take 500 mg by mouth 2 (two) times daily.   No current facility-administered medications on file prior to visit.     Allergies  Allergen Reactions  . Keflex [Cephalexin] Swelling    Tongue and throat swells  . Meloxicam Swelling  . Prednisone Swelling    Can Not take High doses  . Premarin [Estrogens Conjugated] Hives  . Trovan [Alatrofloxacin] Other (See Comments)    Unknown reaction    PMHx:   Past Medical History:  Diagnosis Date  . Allergy   . Anemia   . Arthritis   . Blood transfusion without reported  diagnosis   . Breast cancer of upper-inner quadrant of left female breast ([place]) 02/29/2016   skin- 2016- squamous- on right upper arm  . Diet-controlled type 2 diabetes mellitus ([place])    diet and exercsise,Has never been on meds, type II   . GERD (gastroesophageal reflux disease)   . Gout    takes Allopurinol daily  . History of chemotherapy   . History of colon polyps    benign  . History of shingles   . Hx of radiation therapy   . Hyperlipidemia   . Hypertension   . Personal history of chemotherapy    2017  . Personal history of radiation therapy    2017  . Vitamin D deficiency    Immunization History  Administered Date(s) Administered  . DTaP 07/01/2004  . Influenza Whole 08/15/2013  . Influenza, High Dose Seasonal PF 08/28/2014,  09/20/2015, 08/19/2017, 09/01/2018, 07/28/2019  . Pneumococcal Conjugate-13 09/20/2015  . Pneumococcal Polysaccharide-23 11/01/2009  . Td 08/10/2009  . Zoster 08/02/2011   Past Surgical History:  Procedure Laterality Date  . ABDOMINAL HYSTERECTOMY  1973  . BREAST BIOPSY Left 02/26/2016  . BREAST LUMPECTOMY Left 03/18/2016  . CARDIAC CATHETERIZATION     patient denies this procedure  . CATARACT EXTRACTION, BILATERAL  2014   right eye 2/14; left eye 3/3  . COLONOSCOPY    . HAND SURGERY Left 2012  . INCONTINENCE SURGERY  2006  . KNEE ARTHROSCOPY Right   . MASTOPEXY Right 06/16/2017   Procedure: RIGHT BREAST MASTOPEXY;  Surgeon: Irene Limbo, MD;  Location: [facility];  Service: Plastics;  Laterality: Right;  . PORT-A-CATH REMOVAL N/A 11/27/2016   Procedure: REMOVAL PORT-A-CATH;  Surgeon: Fanny Skates, MD;  Location: [facility];  Service: General;  Laterality: N/A;  . PORTACATH PLACEMENT N/A 04/14/2016   Procedure: INSERTION PORT-A-CATH ;  Surgeon: Fanny Skates, MD;  Location: [facility];  Service: General;  Laterality: N/A;  . RADIOACTIVE SEED GUIDED PARTIAL MASTECTOMY WITH AXILLARY SENTINEL LYMPH NODE BIOPSY Left 03/18/2016   Procedure: RADIOACTIVE SEED GUIDED LEFT PARTIAL MASTECTOMY WITH AXILLARY SENTINEL LYMPH NODE BIOPSY AND BLUE DYE INJECTION;  Surgeon: Fanny Skates, MD;  Location: [facility];  Service: General;  Laterality: Left;  . REDUCTION MAMMAPLASTY Right 2018    FHx:    Reviewed / unchanged  SHx:    Reviewed / unchanged   Systems Review:  Constitutional: Denies fever, chills, wt changes, headaches, insomnia, fatigue, night sweats, change in appetite. Eyes: Denies redness, blurred vision, diplopia, discharge, itchy, watery eyes.  ENT: Denies discharge, congestion, post nasal drip, epistaxis, sore throat, earache, hearing loss, dental pain, tinnitus, vertigo, sinus pain, snoring.  CV: Denies chest pain, palpitations, irregular heartbeat, syncope, dyspnea,  diaphoresis, orthopnea, PND, claudication or edema. Respiratory: denies cough, dyspnea, DOE, pleurisy, hoarseness, laryngitis, wheezing.  Gastrointestinal: Denies dysphagia, odynophagia, heartburn, reflux, water brash, abdominal pain or cramps, nausea, vomiting, bloating, diarrhea, constipation, hematemesis, melena, hematochezia  or hemorrhoids. Genitourinary: Denies dysuria, frequency, urgency, nocturia, hesitancy, discharge, hematuria or flank pain. Musculoskeletal: Denies arthralgias, myalgias, stiffness, jt. swelling, pain, limping or strain/sprain.  Skin: Denies pruritus, rash, hives, warts, acne, eczema or change in skin lesion(s). Neuro: No weakness, tremor, incoordination, spasms, paresthesia or pain. Psychiatric: Denies confusion, memory loss or sensory loss. Endo: Denies change in weight, skin or hair change.  Heme/Lymph: No excessive bleeding, bruising or enlarged lymph nodes.  Physical Exam  BP 134/80   Pulse 72   Temp (!) 97.4 F (36.3 C)   Resp 16  Ht 5' 4"  (1.626 m)   Wt 143 lb 12.8 oz (65.2 kg)   BMI 24.68 kg/m   Appears  well nourished, well groomed  and in no distress.  Eyes: PERRLA, EOMs, conjunctiva no swelling or erythema. Sinuses: No frontal/maxillary tenderness ENT/Mouth: EAC's clear, TM's nl w/o erythema, bulging. Nares clear w/o erythema, swelling, exudates. Oropharynx clear without erythema or exudates. Oral hygiene is good. Tongue normal, non obstructing. Hearing intact.  Neck: Supple. Thyroid not palpable. Car 2+/2+ without bruits, nodes or JVD. Chest: Respirations nl with BS clear & equal w/o rales, rhonchi, wheezing or stridor.  Cor: Heart sounds normal w/ regular rate and rhythm without sig. murmurs, gallops, clicks or rubs. Peripheral pulses normal and equal  without edema.  Abdomen: Soft & bowel sounds normal. Non-tender w/o guarding, rebound, hernias, masses or organomegaly.  Lymphatics: Unremarkable.  Musculoskeletal: Full ROM all peripheral  extremities, joint stability, 5/5 strength and normal gait.  Skin: Warm, dry without exposed rashes, lesions or ecchymosis apparent.  Neuro: Cranial nerves intact, reflexes equal bilaterally. Sensory-motor testing grossly intact. Tendon reflexes grossly intact.  Pysch: Alert & oriented x 3.  Insight and judgement nl & appropriate. No ideations.  Assessment and Plan:  1. Essential hypertension  - Continue medication, monitor blood pressure at home.  - Continue DASH diet.  Reminder to go to the ER if any CP,  SOB, nausea, dizziness, severe HA, changes vision/speech.  - CBC with Diff - COMPLETE METABOLIC PANEL WITH GFR - Magnesium - TSH  2. Hyperlipidemia associated with type 2 diabetes mellitus ([place])  - Continue diet/meds, exercise,& lifestyle modifications.  - Continue monitor periodic cholesterol/liver & renal functions   - Lipid Profile - TSH  3. Type 2 diabetes mellitus with stage 3a chronic kidney disease,  without long-term current use of insulin ([facility])  - Continue diet, exercise  - Lifestyle modifications.  - Monitor appropriate labs.  - Hemoglobin A1c ([facility]) - Insulin, random  4. Vitamin D deficiency  - Continue supplementation.  - Vitamin D (25 hydroxy)  5. Idiopathic gout  - Uric acid  6. Medication management  - CBC with Diff - COMPLETE METABOLIC PANEL WITH GFR - Magnesium - Lipid Profile - TSH - Hemoglobin A1c ([facility]) - Insulin, random - Vitamin D (25 hydroxy) - Uric acid       Discussed  regular exercise, BP monitoring, weight control to achieve/maintain BMI less than 25 and discussed med and SE's. Recommended labs to assess and monitor clinical status with further disposition pending results of labs.  I discussed the assessment and treatment plan with the patient. The patient was provided an opportunity to ask questions and all were answered. The patient agreed with the plan and demonstrated an understanding of the instructions.  I provided  over 30 minutes of exam, counseling, chart review and  complex critical decision making.  Kirtland Bouchard, MD

## 2019-10-25 NOTE — Patient Instructions (Signed)

## 2019-10-26 ENCOUNTER — Other Ambulatory Visit: Payer: Self-pay | Admitting: Internal Medicine

## 2019-10-26 LAB — COMPLETE METABOLIC PANEL WITH GFR
AG Ratio: 1.6 (calc) (ref 1.0–2.5)
ALT: 12 U/L (ref 6–29)
AST: 23 U/L (ref 10–35)
Albumin: 4.4 g/dL (ref 3.6–5.1)
Alkaline phosphatase (APISO): 87 U/L (ref 37–153)
BUN/Creatinine Ratio: 22 (calc) (ref 6–22)
BUN: 25 mg/dL (ref 7–25)
CO2: 27 mmol/L (ref 20–32)
Calcium: 10.5 mg/dL — ABNORMAL HIGH (ref 8.6–10.4)
Chloride: 105 mmol/L (ref 98–110)
Creat: 1.16 mg/dL — ABNORMAL HIGH (ref 0.60–0.88)
GFR, Est African American: 51 mL/min/{1.73_m2} — ABNORMAL LOW (ref >=60)
GFR, Est Non African American: 44 mL/min/{1.73_m2} — ABNORMAL LOW (ref >=60)
Globulin: 2.7 g/dL (calc) (ref 1.9–3.7)
Glucose, Bld: 102 mg/dL — ABNORMAL HIGH (ref 65–99)
Potassium: 4.2 mmol/L (ref 3.5–5.3)
Sodium: 144 mmol/L (ref 135–146)
Total Bilirubin: 0.6 mg/dL (ref 0.2–1.2)
Total Protein: 7.1 g/dL (ref 6.1–8.1)

## 2019-10-26 LAB — CBC WITH DIFFERENTIAL/PLATELET
Absolute Monocytes: 480 cells/uL (ref 200–950)
Basophils Absolute: 48 cells/uL (ref 0–200)
Basophils Relative: 0.8 %
Eosinophils Absolute: 150 cells/uL (ref 15–500)
Eosinophils Relative: 2.5 %
HCT: 37.8 % (ref 35.0–45.0)
Hemoglobin: 12.3 g/dL (ref 11.7–15.5)
Lymphs Abs: 1680 cells/uL (ref 850–3900)
MCH: 29.1 pg (ref 27.0–33.0)
MCHC: 32.5 g/dL (ref 32.0–36.0)
MCV: 89.6 fL (ref 80.0–100.0)
MPV: 10.6 fL (ref 7.5–12.5)
Monocytes Relative: 8 %
Neutro Abs: 3642 cells/uL (ref 1500–7800)
Neutrophils Relative %: 60.7 %
Platelets: 178 10*3/uL (ref 140–400)
RBC: 4.22 10*6/uL (ref 3.80–5.10)
RDW: 14.1 % (ref 11.0–15.0)
Total Lymphocyte: 28 %
WBC: 6 10*3/uL (ref 3.8–10.8)

## 2019-10-26 LAB — HEMOGLOBIN A1C
Hgb A1c MFr Bld: 6.3 % of total Hgb — ABNORMAL HIGH (ref <5.7)
Mean Plasma Glucose: 134 (calc)
eAG (mmol/L): 7.4 (calc)

## 2019-10-26 LAB — MAGNESIUM: Magnesium: 1.4 mg/dL — ABNORMAL LOW (ref 1.5–2.5)

## 2019-10-26 LAB — VITAMIN D 25 HYDROXY (VIT D DEFICIENCY, FRACTURES): Vit D, 25-Hydroxy: 53 ng/mL (ref 30–100)

## 2019-10-26 LAB — TSH: TSH: 3.04 mIU/L (ref 0.40–4.50)

## 2019-10-26 LAB — INSULIN, RANDOM: Insulin: 16.8 u[IU]/mL

## 2019-10-26 LAB — LIPID PANEL
Cholesterol: 143 mg/dL (ref <200)
HDL: 41 mg/dL — ABNORMAL LOW (ref >=50)
LDL Cholesterol (Calc): 78 mg/dL (calc)
Non-HDL Cholesterol (Calc): 102 mg/dL (calc) (ref <130)
Total CHOL/HDL Ratio: 3.5 (calc) (ref <5.0)
Triglycerides: 138 mg/dL (ref <150)

## 2019-10-26 LAB — URIC ACID: Uric Acid, Serum: 5.3 mg/dL (ref 2.5–7.0)

## 2019-10-26 MED ORDER — DICLOFENAC SODIUM 75 MG PO TBEC: DELAYED_RELEASE_TABLET | ORAL | 1 refills | Status: DC

## 2019-10-28 ENCOUNTER — Other Ambulatory Visit: Payer: Self-pay | Admitting: Internal Medicine

## 2019-10-28 DIAGNOSIS — E1122 Type 2 diabetes mellitus with diabetic chronic kidney disease: Secondary | ICD-10-CM

## 2019-11-21 ENCOUNTER — Other Ambulatory Visit: Payer: Self-pay | Admitting: *Deleted

## 2019-11-21 ENCOUNTER — Other Ambulatory Visit: Payer: Self-pay | Admitting: Hematology and Oncology

## 2019-11-21 MED ORDER — METFORMIN HCL ER 500 MG PO TB24: ORAL_TABLET | ORAL | 1 refills | Status: DC

## 2019-12-12 ENCOUNTER — Ambulatory Visit (INDEPENDENT_AMBULATORY_CARE_PROVIDER_SITE_OTHER): Payer: PPO | Admitting: Adult Health

## 2019-12-12 ENCOUNTER — Ambulatory Visit: Payer: Medicare Other | Attending: Internal Medicine

## 2019-12-12 ENCOUNTER — Other Ambulatory Visit: Payer: Self-pay

## 2019-12-12 ENCOUNTER — Ambulatory Visit: Payer: PPO

## 2019-12-12 ENCOUNTER — Encounter: Payer: Self-pay | Admitting: Adult Health

## 2019-12-12 VITALS — BP 128/84 | HR 74 | Temp 95.5°F | Resp 18 | Wt 147.0 lb

## 2019-12-12 DIAGNOSIS — M7989 Other specified soft tissue disorders: Secondary | ICD-10-CM

## 2019-12-12 DIAGNOSIS — D649 Anemia, unspecified: Secondary | ICD-10-CM | POA: Diagnosis not present

## 2019-12-12 DIAGNOSIS — Z23 Encounter for immunization: Secondary | ICD-10-CM | POA: Insufficient documentation

## 2019-12-12 MED ORDER — METFORMIN HCL ER 500 MG PO TB24: ORAL_TABLET | ORAL | 1 refills | Status: DC

## 2019-12-12 NOTE — Progress Notes (Signed)
   Covid-19 Vaccination Clinic  Name:  Kayla Baker    MRN: [id number] DOB: Nov 20, 1936  12/12/2019  [name] was observed post Covid-19 immunization for 15 minutes without incidence. She was provided with Vaccine Information Sheet and instruction to access the V-Safe system.   [name] was instructed to call 911 with any severe reactions post vaccine: Marland Kitchen Difficulty breathing  . Swelling of your face and throat  . A fast heartbeat  . A bad rash all over your body  . Dizziness and weakness    Immunizations Administered    Name Date Dose VIS Date Route   Pfizer COVID-19 Vaccine 12/12/2019  9:08 AM 0.3 mL 11/11/2019 Intramuscular   Manufacturer: [employer], [employer]   Lot: [id number]   Green: [id number]

## 2019-12-12 NOTE — Patient Instructions (Signed)
   Wear compression hose every morning and remove in the evening  Call if getting worse not better    Edema  Edema is when you have too much fluid in your body or under your skin. Edema may make your legs, feet, and ankles swell up. Swelling is also common in looser tissues, like around your eyes. This is a common condition. It gets more common as you get older. There are many possible causes of edema. Eating too much salt (sodium) and being on your feet or sitting for a long time can cause edema in your legs, feet, and ankles. Hot weather may make edema worse. Edema is usually painless. Your skin may look swollen or shiny. Follow these instructions at home:  Keep the swollen body part raised (elevated) above the level of your heart when you are sitting or lying down.  Do not sit still or stand for a long time.  Do not wear tight clothes. Do not wear garters on your upper legs.  Exercise your legs. This can help the swelling go down.  Wear elastic bandages or support stockings as told by your doctor.  Eat a low-salt (low-sodium) diet to reduce fluid as told by your doctor.  Depending on the cause of your swelling, you may need to limit how much fluid you drink (fluid restriction).  Take over-the-counter and prescription medicines only as told by your doctor. Contact a doctor if:  Treatment is not working.  You have heart, liver, or kidney disease and have symptoms of edema.  You have sudden and unexplained weight gain. Get help right away if:  You have shortness of breath or chest pain.  You cannot breathe when you lie down.  You have pain, redness, or warmth in the swollen areas.  You have heart, liver, or kidney disease and get edema all of a sudden.  You have a fever and your symptoms get worse all of a sudden. Summary  Edema is when you have too much fluid in your body or under your skin.  Edema may make your legs, feet, and ankles swell up. Swelling is also  common in looser tissues, like around your eyes.  Raise (elevate) the swollen body part above the level of your heart when you are sitting or lying down.  Follow your doctor's instructions about diet and how much fluid you can drink (fluid restriction). This information is not intended to replace advice given to you by your health care provider. Make sure you discuss any questions you have with your health care provider. Document Revised: 11/20/2017 Document Reviewed: 12/05/2016 Elsevier Patient Education  2020 [employer].

## 2019-12-12 NOTE — Progress Notes (Signed)
Assessment and Plan:  Diagnoses and all orders for this visit:  Swelling of both lower extremities Very mild; R> L but does have bilaterally, doubt DVT; no other sx suggestive of CHF; intermittent Bil without other edema or concerning accompaniements Suspect venous insufficiency - elevate legs TID, increase activity, increase water, decrease sodium intake.  Wear compression socks daily. Continue HCTZ. Return to the office if no change with symptoms. Check labs. -     COMPLETE METABOLIC PANEL WITH GFR -     Urinalysis, Routine w reflex microscopic -     CBC with Diff  Other orders -     metFORMIN (GLUCOPHAGE-XR) 500 MG 24 hr tablet; Take 1 tablet 2 times a day for blood sugar.  Further disposition pending results of labs. Discussed med's effects and SE's.   Over 30 minutes of exam, counseling, chart review, and critical decision making was performed.   Future Appointments  Date Time Provider [place]  12/30/2019  8:30 AM [facility]  01/25/2020 10:00 AM Vicie Mutters, PA-C [facility] None  04/27/2020  9:00 AM [name], MD [facility] None  09/14/2020 11:15 AM [name], MD [facility] None    ------------------------------------------------------------------------------------------------------------------   HPI BP 128/84   Pulse 74   Temp (!) 95.5 F (35.3 C)   Resp 18   Wt 147 lb (66.7 kg)   SpO2 98%   BMI 25.23 kg/m   84 y.o.female with hx of T2DM with CKD III, gout, htn, peripheral neuropathy r/t T2DM and chemotherapy (L breast cancer) presents for evaluation of 2 weeks of intermittent lower extremity swelling.   She reports R foot and ankle swelling, noted 12/01/2019-12/02/2019 but then spontaneously resolved for a few days, then intermittently has flared similarly for a few days and then resolves again spontaneously. Does not resolve or significantly improve overnight. She denies CP, dyspnea, fatigue, palpitations, dizziness,  PND, ankle or calf pain  She reports she did try elevating feet, put blocks under her bed but without notable benefit. She does have compression socks but hasn't been wearing.   Her blood pressure has been controlled at home, today their BP is BP: 128/84 She denies chest pain, shortness of breath, dizziness.  She does not have hx of CHF;  Denies dyspnea on exertion, orthopnea and paroxysmal nocturnal dyspnea. Positive for lower extremity edema. Wt Readings from Last 3 Encounters:  12/12/19 147 lb (66.7 kg)  10/25/19 143 lb 12.8 oz (65.2 kg)  09/15/19 145 lb 11.2 oz (66.1 kg)     Past Medical History:  Diagnosis Date  . Allergy   . Anemia   . Arthritis   . Blood transfusion without reported diagnosis   . Breast cancer of upper-inner quadrant of left female breast ([place]) 02/29/2016   skin- 2016- squamous- on right upper arm  . Diet-controlled type 2 diabetes mellitus ([place])    diet and exercsise,Has never been on meds, type II   . GERD (gastroesophageal reflux disease)   . Gout    takes Allopurinol daily  . History of chemotherapy   . History of colon polyps    benign  . History of shingles   . Hx of radiation therapy   . Hyperlipidemia   . Hypertension   . Personal history of chemotherapy    2017  . Personal history of radiation therapy    2017  . Vitamin D deficiency      Allergies  Allergen Reactions  . Keflex [Cephalexin] Swelling    Tongue  and throat swells  . Meloxicam Swelling  . Prednisone Swelling    Can Not take High doses  . Premarin [Estrogens Conjugated] Hives  . Trovan [Alatrofloxacin] Other (See Comments)    Unknown reaction    Current Outpatient Medications on File Prior to Visit  Medication Sig  . acetaminophen (TYLENOL) 325 MG tablet Take 325 mg by mouth every 6 (six) hours as needed for headache.  . allopurinol (ZYLOPRIM) 300 MG tablet Take 1 tablet Daily to Prevent Gout  . anastrozole (ARIMIDEX) 1 MG tablet TAKE 1 TABLET(1 MG) BY MOUTH DAILY   . aspirin 81 MG tablet Take 81 mg by mouth daily.  . Cholecalciferol (VITAMIN D3) 5000 units CAPS Take 5,000 Units by mouth daily.  Marland Kitchen ezetimibe (ZETIA) 10 MG tablet Take 1 tablet Daily for Cholesterol  . gabapentin (NEURONTIN) 300 MG capsule Take 1 capsule 3 to 4  x /day as needed for pain  . glucose blood (FREESTYLE TEST STRIPS) test strip Check Blood Sugar Daily as directed (Dx: E11.29)  . hydrochlorothiazide (HYDRODIURIL) 25 MG tablet TAKE ONE TABLET BY MOUTH DAILY  . Lancets (FREESTYLE) lancets CHECK BLOOD SUGAR DAILY AS DIRECTED  . loratadine (CLARITIN) 10 MG tablet Take 10 mg by mouth daily as needed for allergies.  . Magnesium ([employer] MAGNESIUM) 250 MG TABS Take 500 mg by mouth 2 (two) times daily.  . metoprolol succinate (TOPROL-XL) 25 MG 24 hr tablet Take 1 tablet Daily for BP  . rosuvastatin (CRESTOR) 5 MG tablet Take 5 mg tablet three times a week  . vitamin C (ASCORBIC ACID) 500 MG tablet Take 500 mg by mouth daily. Takes 2 tablets daily   No current facility-administered medications on file prior to visit.    ROS: all negative except above.   Physical Exam:  BP 128/84   Pulse 74   Temp (!) 95.5 F (35.3 C)   Resp 18   Wt 147 lb (66.7 kg)   SpO2 98%   BMI 25.23 kg/m   General Appearance: Well nourished, in no apparent distress. Eyes: PERRLA, conjunctiva no swelling or erythema ENT/Mouth: Mask in place; Hearing normal.  Neck: Supple, thyroid normal.  Respiratory: Respiratory effort normal, BS equal bilaterally without rales, rhonchi, wheezing or stridor.  Cardio: RRR with no MRGs. Brisk peripheral pulses with scant edema left ankle, 1+ pitting edema to ankle.  Abdomen: Soft, + BS.  Non tender, no guarding, rebound, hernias, masses. Lymphatics: Non tender without lymphadenopathy.  Musculoskeletal: Full ROM, 5/5 strength, normal gait.  Skin: Warm, dry without rashes, lesions, ecchymosis.  Neuro: Cranial nerves intact. Normal muscle tone, no cerebellar symptoms.  Sensation intact.  Psych: Awake and oriented X 3, normal affect, Insight and Judgment appropriate.     Kayla Ribas, NP 2:17 PM [facility]

## 2019-12-14 ENCOUNTER — Other Ambulatory Visit: Payer: Self-pay | Admitting: Adult Health

## 2019-12-14 DIAGNOSIS — D509 Iron deficiency anemia, unspecified: Secondary | ICD-10-CM

## 2019-12-14 LAB — CBC WITH DIFFERENTIAL/PLATELET
Absolute Monocytes: 784 cells/uL (ref 200–950)
Basophils Absolute: 59 cells/uL (ref 0–200)
Basophils Relative: 0.8 %
Eosinophils Absolute: 178 cells/uL (ref 15–500)
Eosinophils Relative: 2.4 %
HCT: 35.9 % (ref 35.0–45.0)
Hemoglobin: 11.6 g/dL — ABNORMAL LOW (ref 11.7–15.5)
Lymphs Abs: 2183 cells/uL (ref 850–3900)
MCH: 29 pg (ref 27.0–33.0)
MCHC: 32.3 g/dL (ref 32.0–36.0)
MCV: 89.8 fL (ref 80.0–100.0)
MPV: 10.5 fL (ref 7.5–12.5)
Monocytes Relative: 10.6 %
Neutro Abs: 4196 cells/uL (ref 1500–7800)
Neutrophils Relative %: 56.7 %
Platelets: 202 10*3/uL (ref 140–400)
RBC: 4 10*6/uL (ref 3.80–5.10)
RDW: 14.1 % (ref 11.0–15.0)
Total Lymphocyte: 29.5 %
WBC: 7.4 10*3/uL (ref 3.8–10.8)

## 2019-12-14 LAB — COMPLETE METABOLIC PANEL WITH GFR
AG Ratio: 1.6 (calc) (ref 1.0–2.5)
ALT: 13 U/L (ref 6–29)
AST: 26 U/L (ref 10–35)
Albumin: 4.2 g/dL (ref 3.6–5.1)
Alkaline phosphatase (APISO): 84 U/L (ref 37–153)
BUN/Creatinine Ratio: 20 (calc) (ref 6–22)
BUN: 27 mg/dL — ABNORMAL HIGH (ref 7–25)
CO2: 29 mmol/L (ref 20–32)
Calcium: 10.2 mg/dL (ref 8.6–10.4)
Chloride: 105 mmol/L (ref 98–110)
Creat: 1.38 mg/dL — ABNORMAL HIGH (ref 0.60–0.88)
GFR, Est African American: 41 mL/min/{1.73_m2} — ABNORMAL LOW (ref >=60)
GFR, Est Non African American: 36 mL/min/{1.73_m2} — ABNORMAL LOW (ref >=60)
Globulin: 2.7 g/dL (calc) (ref 1.9–3.7)
Glucose, Bld: 79 mg/dL (ref 65–99)
Potassium: 4.3 mmol/L (ref 3.5–5.3)
Sodium: 143 mmol/L (ref 135–146)
Total Bilirubin: 0.4 mg/dL (ref 0.2–1.2)
Total Protein: 6.9 g/dL (ref 6.1–8.1)

## 2019-12-14 LAB — IRON,TIBC AND FERRITIN PANEL
%SAT: 14 % (calc) — ABNORMAL LOW (ref 16–45)
Ferritin: 127 ng/mL (ref 16–288)
Iron: 49 ug/dL (ref 45–160)
TIBC: 353 mcg/dL (calc) (ref 250–450)

## 2019-12-14 LAB — URINALYSIS, ROUTINE W REFLEX MICROSCOPIC
Bacteria, UA: NONE SEEN /HPF
Bilirubin Urine: NEGATIVE
Glucose, UA: NEGATIVE
Hgb urine dipstick: NEGATIVE
Hyaline Cast: NONE SEEN /LPF
Nitrite: NEGATIVE
Protein, ur: NEGATIVE
RBC / HPF: NONE SEEN /HPF (ref 0–2)
Specific Gravity, Urine: 1.02 (ref 1.001–1.03)
Squamous Epithelial / HPF: NONE SEEN /HPF (ref <=5)
pH: <=5 (ref 5.0–8.0)

## 2019-12-14 LAB — TEST AUTHORIZATION

## 2019-12-27 ENCOUNTER — Other Ambulatory Visit: Payer: Self-pay | Admitting: Internal Medicine

## 2019-12-30 ENCOUNTER — Ambulatory Visit: Payer: PPO

## 2019-12-31 ENCOUNTER — Ambulatory Visit: Payer: PPO | Attending: Internal Medicine

## 2019-12-31 DIAGNOSIS — Z23 Encounter for immunization: Secondary | ICD-10-CM | POA: Insufficient documentation

## 2019-12-31 NOTE — Progress Notes (Signed)
   Covid-19 Vaccination Clinic  Name:  Kayla Baker    MRN: [id number] DOB: [birth year]  12/31/2019  Kayla Baker was observed post Covid-19 immunization for 15 minutes without incidence. She was provided with Vaccine Information Sheet and instruction to access the V-Safe system.   Kayla Baker was instructed to call 911 with any severe reactions post vaccine: Marland Kitchen Difficulty breathing  . Swelling of your face and throat  . A fast heartbeat  . A bad rash all over your body  . Dizziness and weakness    Immunizations Administered    Name Date Dose VIS Date Route   Pfizer COVID-19 Vaccine 12/31/2019  8:26 AM 0.3 mL 11/11/2019 Intramuscular   Manufacturer: [place]   Lot: [id number]   [place]: [id number]

## 2020-01-03 ENCOUNTER — Other Ambulatory Visit: Payer: Self-pay

## 2020-01-03 DIAGNOSIS — Z1211 Encounter for screening for malignant neoplasm of colon: Secondary | ICD-10-CM | POA: Diagnosis not present

## 2020-01-03 DIAGNOSIS — D509 Iron deficiency anemia, unspecified: Secondary | ICD-10-CM

## 2020-01-03 LAB — POC HEMOCCULT BLD/STL (HOME/3-CARD/SCREEN)
Card #2 Fecal Occult Blod, POC: NEGATIVE
Card #3 Fecal Occult Blood, POC: NEGATIVE
Fecal Occult Blood, POC: NEGATIVE

## 2020-01-11 NOTE — Progress Notes (Signed)
Assessment and Plan:  Kayla Baker was seen today for urinary tract infection.  Diagnoses and all orders for this visit:  Incomplete emptying of bladder Dysuria Increase water intake will treat based on duration of symptoms. -     Urinalysis w microscopic + reflex cultur -     amoxicillin (AMOXIL) 250 MG capsule; Take 1 capsule three times a day for 7 days for infection.  Iron deficiency anemia, unspecified iron deficiency anemia type -Will check at next OV Continue taking supplementation  Type 2 diabetes mellitus with stage 3a chronic kidney disease, without long-term current use of insulin (HCC) Blood glucose well controlled, continue to mointor Taking Metformin 54m BID  Edema of right lower extremity Continue water intake, compression stockings and elevation. Improved from last OV. furosemide (LASIX) 20 MG tablet; Take 1 tablet (20 mg total) by mouth daily. STOP Hydrochlorothiazide while taking this.  Pelvic pressure in female Discussed other causation such as vaginal dryness Continue to monitor  Other orders -     Urine Culture -     REFLEXIVE URINE CULTURE   Further disposition pending results of labs. Discussed med's effects and SE's.   Over 30 minutes of exam, counseling, chart review, and critical decision making was performed.   Future Appointments  Date Time Provider [place] 02/02/2020 10:45 AM CVicie Mutters PA-C [facility] None  05/03/2020  9:00 AM MUnk Pinto MD [facility] None  09/14/2020 11:15 AM GNicholas Lose MD [facility] None    ------------------------------------------------------------------------------------------------------------------   HPI 84y.o.female presents for for Increased urination and pelvic pressure.  She reports this started back in Jan.  She has OV with symptoms, culture was negative and no antibiotics at that time.  She continued to have mild symptoms and in the past week they have began to increase.  She reports she  is having increased pain and pressure and increased urination.  She has taken a home urinary test kit and the strip shows positive ketones & leukocytes.  She has increased her water intake to try to he;p with this.  She denies any hematuria, abdominal pain, nausea vomiting, constipation or diarrhea.  She denies any vaginal itching or abnormal discharge.   Denies any vaginal dryness or sexual activity.  She reports that she is due to have her iron panel rechecked as she was started on iron at her last routine follow up.    She reports she has been battling BLE.  She has been wearing compression stocking daily.  She reports she elevated her feet above her heart when not active.  She reports after sleeping through the night her edema is not improved.    Past Medical History:  Diagnosis Date  . Allergy   . Anemia   . Arthritis   . Blood transfusion without reported diagnosis   . [place]   skin- 2016- squamous- on right upper arm  . [place]    diet and exercsise,Has never been on meds, type II   . GERD (gastroesophageal reflux disease)   . Gout    takes Allopurinol daily  . History of chemotherapy   . History of colon polyps    benign  . History of shingles   . Hx of radiation therapy   . Hyperlipidemia   . Hypertension   . Personal history of chemotherapy    2017  . Personal history of radiation therapy    2017  . Vitamin  D deficiency      Allergies  Allergen Reactions  . Keflex [Cephalexin] Swelling    Tongue and throat swells  . Meloxicam Swelling  . Prednisone Swelling    Can Not take High doses  . Premarin [Estrogens Conjugated] Hives  . Trovan [Alatrofloxacin] Other (See Comments)    Unknown reaction    Current Outpatient Medications on File Prior to Visit  Medication Sig  . acetaminophen (TYLENOL) 325 MG tablet Take 325 mg by mouth every 6 (six) hours as needed for  headache.  . allopurinol (ZYLOPRIM) 300 MG tablet Take 1 tablet Daily to Prevent Gout  . anastrozole (ARIMIDEX) 1 MG tablet TAKE 1 TABLET(1 MG) BY MOUTH DAILY  . aspirin 81 MG tablet Take 81 mg by mouth daily.  . Cholecalciferol (VITAMIN D3) 5000 units CAPS Take 5,000 Units by mouth daily.  Marland Kitchen ezetimibe (ZETIA) 10 MG tablet Take 1 tablet Daily for Cholesterol  . gabapentin (NEURONTIN) 300 MG capsule Take 1 capsule 3 to 4  x /day as needed for pain  . glucose blood (FREESTYLE TEST STRIPS) test strip Check Blood Sugar Daily as directed (Dx: E11.29)  . hydrochlorothiazide (HYDRODIURIL) 25 MG tablet Take 1 tablet Daily for BP  & Fluid Retention / Ankle Swelling  . Lancets (FREESTYLE) lancets CHECK BLOOD SUGAR DAILY AS DIRECTED  . loratadine (CLARITIN) 10 MG tablet Take 10 mg by mouth daily as needed for allergies.  . Magnesium ([employer] MAGNESIUM) 250 MG TABS Take 500 mg by mouth 2 (two) times daily.  . metFORMIN (GLUCOPHAGE-XR) 500 MG 24 hr tablet Take 1 tablet 2 times a day for blood sugar.  . metoprolol succinate (TOPROL-XL) 25 MG 24 hr tablet Take 1 tablet Daily for BP  . rosuvastatin (CRESTOR) 5 MG tablet Take 5 mg tablet three times a week  . vitamin C (ASCORBIC ACID) 500 MG tablet Take 500 mg by mouth daily. Takes 2 tablets daily   No current facility-administered medications on file prior to visit.    ROS: all negative except above.   Physical Exam:  BP 110/62   Pulse 87   Temp (!) 96.4 F (35.8 C)   Wt 145 lb 6.4 oz (66 kg)   SpO2 97%   BMI 24.96 kg/m   General Appearance: Well nourished, in no apparent distress. Eyes: PERRLA, EOMs, conjunctiva no swelling or erythema Sinuses: No Frontal/maxillary tenderness ENT/Mouth: Ext aud canals clear, TMs without erythema, bulging. No erythema, swelling, or exudate on post pharynx.  Tonsils not swollen or erythematous. Hearing normal.  Neck: Supple, thyroid normal.  Respiratory: Respiratory effort normal, BS equal bilaterally without rales,  rhonchi, wheezing or stridor.  Cardio: RRR with no MRGs. Brisk peripheral pulses.  +1 pitting edema to RLE. Abdomen: Soft, + BS.  Non tender, no guarding, rebound, hernias, masses. Lymphatics: Non tender without lymphadenopathy.  Musculoskeletal: Full ROM, 5/5 strength, normal gait.  Skin: Warm, dry without rashes, lesions, ecchymosis.  Neuro: Cranial nerves intact. Normal muscle tone, no cerebellar symptoms. Sensation intact.  Psych: Awake and oriented X 3, normal affect, Insight and Judgment appropriate.     Garnet Sierras, NP 1:30 PM [facility]

## 2020-01-12 ENCOUNTER — Encounter: Payer: Self-pay | Admitting: Adult Health Nurse Practitioner

## 2020-01-12 ENCOUNTER — Other Ambulatory Visit: Payer: Self-pay

## 2020-01-12 ENCOUNTER — Ambulatory Visit (INDEPENDENT_AMBULATORY_CARE_PROVIDER_SITE_OTHER): Payer: PPO | Admitting: Adult Health Nurse Practitioner

## 2020-01-12 VITALS — BP 110/62 | HR 87 | Temp 96.4°F | Wt 145.4 lb

## 2020-01-12 DIAGNOSIS — R102 Pelvic and perineal pain: Secondary | ICD-10-CM

## 2020-01-12 DIAGNOSIS — R300 Dysuria: Secondary | ICD-10-CM

## 2020-01-12 DIAGNOSIS — R339 Retention of urine, unspecified: Secondary | ICD-10-CM

## 2020-01-12 DIAGNOSIS — D509 Iron deficiency anemia, unspecified: Secondary | ICD-10-CM

## 2020-01-12 DIAGNOSIS — N1831 Chronic kidney disease, stage 3a: Secondary | ICD-10-CM

## 2020-01-12 DIAGNOSIS — R600 Localized edema: Secondary | ICD-10-CM | POA: Diagnosis not present

## 2020-01-12 DIAGNOSIS — E1121 Type 2 diabetes mellitus with diabetic nephropathy: Secondary | ICD-10-CM | POA: Diagnosis not present

## 2020-01-12 MED ORDER — AMOXICILLIN 250 MG PO CAPS: ORAL_CAPSULE | ORAL | 0 refills | Status: DC

## 2020-01-12 MED ORDER — FUROSEMIDE 20 MG PO TABS: 20.0000 mg | ORAL_TABLET | Freq: Every day | ORAL | 0 refills | Status: DC

## 2020-01-14 ENCOUNTER — Other Ambulatory Visit: Payer: Self-pay | Admitting: Adult Health Nurse Practitioner

## 2020-01-14 DIAGNOSIS — R600 Localized edema: Secondary | ICD-10-CM

## 2020-01-14 LAB — URINALYSIS W MICROSCOPIC + REFLEX CULTURE
Bacteria, UA: NONE SEEN /HPF
Bilirubin Urine: NEGATIVE
Glucose, UA: NEGATIVE
Hgb urine dipstick: NEGATIVE
Hyaline Cast: NONE SEEN /LPF
Ketones, ur: NEGATIVE
Nitrites, Initial: NEGATIVE
Protein, ur: NEGATIVE
Specific Gravity, Urine: 1.013 (ref 1.001–1.03)
pH: 7 (ref 5.0–8.0)

## 2020-01-14 LAB — URINE CULTURE
MICRO NUMBER:: 10144268
SPECIMEN QUALITY:: ADEQUATE

## 2020-01-14 LAB — CULTURE INDICATED

## 2020-01-16 ENCOUNTER — Telehealth: Payer: Self-pay

## 2020-01-16 NOTE — Telephone Encounter (Signed)
Patient is inquiring about lab results. Please advise.

## 2020-01-20 ENCOUNTER — Other Ambulatory Visit: Payer: Self-pay | Admitting: Hematology and Oncology

## 2020-01-20 DIAGNOSIS — Z853 Personal history of malignant neoplasm of breast: Secondary | ICD-10-CM

## 2020-01-23 ENCOUNTER — Other Ambulatory Visit: Payer: Self-pay | Admitting: Internal Medicine

## 2020-01-24 ENCOUNTER — Ambulatory Visit: Payer: Self-pay | Admitting: Physician Assistant

## 2020-01-25 ENCOUNTER — Ambulatory Visit: Payer: Self-pay | Admitting: Physician Assistant

## 2020-01-25 ENCOUNTER — Other Ambulatory Visit: Payer: Self-pay | Admitting: Adult Health Nurse Practitioner

## 2020-01-25 DIAGNOSIS — E876 Hypokalemia: Secondary | ICD-10-CM

## 2020-01-25 DIAGNOSIS — R600 Localized edema: Secondary | ICD-10-CM

## 2020-01-25 MED ORDER — POTASSIUM CHLORIDE ER 10 MEQ PO TBCR: EXTENDED_RELEASE_TABLET | ORAL | 1 refills | Status: DC

## 2020-01-25 NOTE — Progress Notes (Signed)
Contacted by patient.  Edema was decreased with trial dose of daily furosemide.  Tolerated well, urination increased.  Edema has returned after stopping this.  Sent in Rx for Furosemide 28m daily with potassium 154m daily while taking furosemide.  D/C HCTZ, patient is aware of instructions and has follow up in one week to follow up with labs and tolerability related to her kidney function.  KyGarnet SierrasNP [facility] 01/25/2020  2:32 PM

## 2020-01-26 ENCOUNTER — Other Ambulatory Visit: Payer: Self-pay | Admitting: Adult Health Nurse Practitioner

## 2020-01-26 DIAGNOSIS — E876 Hypokalemia: Secondary | ICD-10-CM

## 2020-01-26 DIAGNOSIS — R600 Localized edema: Secondary | ICD-10-CM

## 2020-01-30 ENCOUNTER — Other Ambulatory Visit: Payer: Self-pay

## 2020-01-30 DIAGNOSIS — R600 Localized edema: Secondary | ICD-10-CM

## 2020-01-30 DIAGNOSIS — E876 Hypokalemia: Secondary | ICD-10-CM

## 2020-01-30 MED ORDER — POTASSIUM CHLORIDE ER 10 MEQ PO TBCR: EXTENDED_RELEASE_TABLET | ORAL | 1 refills | Status: DC

## 2020-01-30 NOTE — Progress Notes (Signed)
Patient states that she only received one tablet of Potassium. 90 day supply sent to pharmacy.

## 2020-02-01 NOTE — Progress Notes (Signed)
MEDICARE ANNUAL WELLNESS VISIT AND FOLLOW UP  Assessment:   Encounter for Medicare annual wellness exam 1 year  Hyperlipidemia associated with type 2 diabetes mellitus ([place]) Discussed general issues about diabetes pathophysiology and management., Educational material distributed., Suggested low cholesterol diet., Encouraged aerobic exercise., Discussed foot care., Reminded to get yearly retinal exam.  Type 2 diabetes mellitus with stage 3 chronic kidney disease, without long-term current use of insulin, unspecified whether stage 3a or 3b CKD (HCC) Increase fluids, avoid NSAIDS, monitor sugars, will monitor  Diabetic neuropathy, painful [facility]) May refer to podiatry  CKD stage 3 due to type 2 diabetes mellitus ([place]) Increase fluids, avoid NSAIDS, monitor sugars, will monitor  Malignant neoplasm of upper-inner quadrant of left breast in female, estrogen receptor positive ([place]) Being monitored by [name].   Chemotherapy-induced peripheral neuropathy (HCC) Monitor feet, may refer to podiatry in the future, declines referral at this time  Urine abnormality -     Urine Culture -     Urinalysis, Routine w reflex microscopic - if continues to have issues will discuss treating vaginal atrophy  Need for vaccine for DT (diphtheria-tetanus) Out of in the office, get next OV  Over 30 minutes of exam, counseling, chart review, and critical decision making was performed Future Appointments  Date Time Provider [place]  03/06/2020 10:00 AM [facility] TOM[facility] 1 [facility]  05/03/2020  9:00 AM [name], MD [facility] None  09/14/2020 11:15 AM [name], MD [facility] None    Plan:   During the course of the visit the patient was educated and counseled about appropriate screening and preventive services including:    Pneumococcal vaccine   Influenza vaccine  Td vaccine  Prevnar 13  Screening electrocardiogram  Screening mammography  Bone densitometry  screening  Colorectal cancer screening  Diabetes screening  Glaucoma screening  Nutrition counseling   Advanced directives: given info/requested copies    Subjective:   Kayla Baker is a 84 y.o. female who presents for Medicare Annual Wellness Visit and 3 month follow up on hypertension, T2 diabetes, hyperlipidemia, vitamin D def.   She has swelling in right lower leg, stopped her HCTZ and started on lasix 20 mg daily.  She has had 2 arthroscopic surgeries on her right knee. BP: 110/68  She has no redness, warmth, pain in that leg. No swelling into her foot.  Has been wearing compression socks some.  Echo normal 2017 [place].   [name] She states every time she urinates she will have a BM, has soft stools each time, will feel urge to defecate with urination, had urinary tract infection last visit, will recheck this visit. Treated with amoxicillin. She does have untreated vaginal dryness.   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 Mammogram from 03/2018, s/p BX.  BMI is Body mass index is 24.89 kg/m., she has been working on diet, pushing fruits/vegetables, watching white carbs; she does have [employer] membership but hasn't been going. She is very active with housework and yardwork.  Wt Readings from Last 3 Encounters:  02/02/20 145 lb (65.8 kg)  01/12/20 145 lb 6.4 oz (66 kg)  12/12/19 147 lb (66.7 kg)   Her blood pressure has been controlled at home, today their BP is BP: 110/68 She does not workout.  She denies chest pain, shortness of breath, dizziness.   She is on cholesterol medication (zetia due to hx of LFT elevation with statin) and denies myalgias. Her cholesterol is at goal. The cholesterol last visit was:   Lab Results  Component Value Date   CHOL 143  10/25/2019   HDL 41 (L) 10/25/2019   LDLCALC 78 10/25/2019   TRIG 138 10/25/2019   CHOLHDL 3.5 10/25/2019   She has been working on diet and exercise for T2 diabetes on metformin (takes 2 tabs of 500 mg daily), and denies foot ulcerations, hyperglycemia, hypoglycemia , increased appetite, nausea, polydipsia, polyuria, visual disturbances, vomiting and weight loss.She reports that her blood sugars have been running 107-109. She does have some mild neuropathy in her feet, coming off Cymbalta and going to continue on gabapentin..    Last A1C in the office was:  Lab Results  Component Value Date   HGBA1C 6.3 (H) 10/25/2019   Last GFR; followed by Dr. Hollie Salk Lab Results  Component Value Date   GFRNONAA 36 (L) 12/12/2019   Patient is on Vitamin D supplement. Lab Results  Component Value Date   VD25OH 53 10/25/2019     Patient is on allopurinol for gout and does not report a recent flare.  Lab Results  Component Value Date   LABURIC 5.3 10/25/2019     Medication Review  Current Outpatient Medications (Endocrine & Metabolic):  .  metFORMIN (GLUCOPHAGE-XR) 500 MG 24 hr tablet, Take 1 tablet 2 times a day for blood sugar.  Current Outpatient Medications (Cardiovascular):  .  ezetimibe (ZETIA) 10 MG tablet, Take 1 tablet Daily for Cholesterol .  furosemide (LASIX) 20 MG tablet, Take 1 tablet Daily for BP, & Fluid Retention / Ankle Swelling .  metoprolol succinate (TOPROL-XL) 25 MG 24 hr tablet, Take 1 tablet Daily for BP .  rosuvastatin (CRESTOR) 5 MG tablet, Take 5 mg tablet three times a week  Current Outpatient Medications (Respiratory):  .  loratadine (CLARITIN) 10 MG tablet, Take 10 mg by mouth daily as needed for allergies.  Current Outpatient Medications (Analgesics):  .  acetaminophen (TYLENOL) 325 MG tablet, Take 325 mg by mouth every 6 (six) hours as needed for headache. .  allopurinol (ZYLOPRIM) 300 MG tablet, Take 1 tablet Daily to Prevent Gout .  aspirin 81 MG tablet,  Take 81 mg by mouth daily.   Current Outpatient Medications (Other):  .  anastrozole (ARIMIDEX) 1 MG tablet, TAKE 1 TABLET(1 MG) BY MOUTH DAILY .  Cholecalciferol (VITAMIN D3) 5000 units CAPS, Take 5,000 Units by mouth daily. Marland Kitchen  gabapentin (NEURONTIN) 300 MG capsule, Take 1 capsule 3 to 4  x /day as needed for pain .  glucose blood (FREESTYLE TEST STRIPS) test strip, Check Blood Sugar Daily as directed (Dx: E11.29) .  Lancets (FREESTYLE) lancets, CHECK BLOOD SUGAR DAILY AS DIRECTED .  Magnesium ([employer] MAGNESIUM) 250 MG TABS, Take 500 mg by mouth 2 (two) times daily. .  potassium chloride (KLOR-CON) 10 MEQ tablet, TAKE 1 TABLET WITH FOOD WHEN TAKING FUROSEMIDE .  vitamin C (ASCORBIC ACID) 500 MG tablet, Take 500 mg by mouth daily. Takes 2 tablets daily  Current Problems (verified) Patient Active Problem List   Diagnosis Date Noted  . CKD stage 3 due to type 2 diabetes mellitus ([place]) 07/05/2018  . Port catheter in place 09/18/2016  . Chemotherapy-induced peripheral neuropathy ([place]) 08/07/2016  . Breast cancer of upper-inner quadrant of left female breast ([place]) 02/29/2016  . Gout 12/08/2014  .  Diabetic neuropathy, painful ([place]) 08/28/2014  . Medication management 08/27/2014  . Essential hypertension 11/10/2013  . Hyperlipidemia associated with type 2 diabetes mellitus ([place]) 11/10/2013  . T2_NIDDM w/CKD3 (GFR 45 ml/min) 11/10/2013  . Vitamin D deficiency 11/10/2013    Screening Tests Immunization History  Administered Date(s) Administered  . DTaP 07/01/2004  . Influenza Whole 08/15/2013  . Influenza, High Dose Seasonal PF 08/28/2014, 09/20/2015, 08/19/2017, 09/01/2018, 07/28/2019  . PFIZER SARS-COV-2 Vaccination 12/12/2019, 12/31/2019  . Pneumococcal Conjugate-13 09/20/2015  . Pneumococcal Polysaccharide-23 11/01/2009  . Td 08/10/2009  . Zoster 08/02/2011    Preventative care: Last colonoscopy: 2019 will not need another Last mammogram: 03/2019 - Last pap smear/pelvic exam:  remotely DEXA: 05/2019 - 1.6 osteopenia  Prior vaccinations: TD or Tdap: 2010- she does yard work- would suggest Influenza: 2020  Pneumococcal: 2010 Prevnar13: 2016  Shingles/Zostavax: 2012 discussed will get eventually COVID complete 2021  Names of Other Physician/Practitioners you currently use: 1. [facility]- here for primary care 2. Dr. Nicki Reaper, eye doctor, last visit 02/17/2019 - will send for report 3. Dr. Jolayne Panther, dentist, last visit 2019  Patient Care Team: [name], MD as PCP - Kinnie Scales, [place] as Referring Physician (Optometry) [name], MD as Consulting Physician (Dermatology) Latanya Maudlin, MD as Consulting Physician (Orthopedic Surgery) Inda Castle, MD (Inactive) as Consulting Physician (Gastroenterology) [name], MD as Consulting Physician (Hematology and Oncology)  Allergies Allergies  Allergen Reactions  . Keflex [Cephalexin] Swelling    Tongue and throat swells  . Meloxicam Swelling  . Prednisone Swelling    Can Not take High doses  . Premarin [Estrogens Conjugated] Hives  . Trovan [Alatrofloxacin] Other (See Comments)    Unknown reaction    SURGICAL HISTORY She  has a past surgical history that includes Cataract extraction, bilateral (2014); Abdominal hysterectomy (1973); Incontinence surgery (2006); Knee arthroscopy (Right); Hand surgery (Left, 2012); Colonoscopy; Radioactive seed guided mastectomy with axillary sentinel lymph node biopsy (Left, 03/18/2016); Cardiac catheterization; Portacath placement (N/A, 04/14/2016); Port-a-cath removal (N/A, 11/27/2016); Mastopexy (Right, 06/16/2017); Breast biopsy (Left, 02/26/2016); Breast lumpectomy (Left, 03/18/2016); and Reduction mammaplasty (Right, 2018).   FAMILY HISTORY Her family history includes Alzheimer's disease in her father; Breast cancer (age of onset: 17) in her sister; Cirrhosis in her sister; Diabetes in her father; Epilepsy in some other  family members; Esophageal cancer (age of onset: 68) in her maternal aunt; Heart Problems in her maternal grandmother; Heart attack (age of onset: 59) in her maternal uncle; Hypertension in her sister; Lung cancer in an other family member; Lung cancer (age of onset: 43) in her sister; Other in her mother and sister; Other (age of onset: 68) in an other family member; Pancreatic cancer (age of onset: 32) in her cousin; Parkinson's disease in her maternal aunt; Stroke in her maternal grandmother; Stroke (age of onset: 35) in her mother.   SOCIAL HISTORY She  reports that she has never smoked. She has never used smokeless tobacco. She reports that she does not drink alcohol or use drugs.  MEDICARE WELLNESS OBJECTIVES: Tobacco use: She does not smoke.  Patient is not a former smoker. If yes, counseling given Alcohol Current alcohol use: social drinker Diet: in general, a "healthy" diet   Physical activity: Current Exercise Habits: The patient does not participate in regular exercise at present Cardiac risk factors: Cardiac Risk Factors include: advanced age (>72mn, >>60women);diabetes mellitus;dyslipidemia;hypertension;obesity (BMI >30kg/m2);sedentary lifestyle Depression/mood screen:   Dep[facility] screen [facility] 02/02/2020  Decreased Interest  0  Down, Depressed, Hopeless 0  PHQ - 2 Score 0  Some recent data might be hidden    ADLs:  In your present state of health, do you have any difficulty performing the following activities: 02/02/2020 04/17/2019  Hearing? N N  Vision? N N  Difficulty concentrating or making decisions? N N  Comment able to recall if given time -  Walking or climbing stairs? N N  Dressing or bathing? N N  Doing errands, shopping? N N  Some recent data might be hidden     Cognitive Testing  Alert? Yes  Normal Appearance?Yes  Oriented to person? Yes  Place? Yes   Time? Yes  Recall of three objects?  Yes  Can perform simple calculations? Yes  Displays appropriate  judgment?Yes  Can read the correct time from a watch face?Yes  EOL planning: Does Patient Have a Medical Advance Directive?: Yes Type of Advance Directive: Living will Does patient want to make changes to medical advance directive?: No - Patient declined   Objective:   Today's Vitals   02/02/20 1108  BP: 110/68  Pulse: 76  Temp: 97.6 F (36.4 C)  SpO2: 98%  Weight: 145 lb (65.8 kg)  Height: 5' 4"  (1.626 m)   Body mass index is 24.89 kg/m.  General appearance: alert, no distress, WD/WN,  female HEENT: normocephalic, sclerae anicteric, TMs pearly, nares patent, no discharge or erythema, pharynx normal Oral cavity: MMM, no lesions Neck: supple, no lymphadenopathy, no thyromegaly, no masses Heart: RRR, normal S1, S2, no murmurs Lungs: CTA bilaterally, no wheezes, rhonchi, or rales Abdomen: +bs, soft, non tender, non distended, no masses, no hepatomegaly, no splenomegaly Musculoskeletal: nontender, no swelling, no obvious deformity Extremities: no edema, no cyanosis, no clubbing Pulses: 2+ symmetric, upper and lower extremities, normal cap refill Neurological: alert, oriented x 3, CN2-12 intact, strength normal upper extremities and lower extremities, sensation normal throughout, DTRs 2+ throughout, no cerebellar signs, gait normal Psychiatric: normal affect, behavior normal, pleasant  Gyn: defer Rectal: defer   Medicare Attestation I have personally reviewed: The patient's medical and social history Their use of alcohol, tobacco or illicit drugs Their current medications and supplements The patient's functional ability including ADLs,fall risks, home safety risks, cognitive, and hearing and visual impairment Diet and physical activities Evidence for depression or mood disorders  The patient's weight, height, BMI, and visual acuity have been recorded in the chart.  I have made referrals, counseling, and provided education to the patient based on review of the above and I  have provided the patient with a written personalized care plan for preventive services.     Vicie Mutters, PA-C   02/02/2020

## 2020-02-02 ENCOUNTER — Other Ambulatory Visit: Payer: Self-pay

## 2020-02-02 ENCOUNTER — Ambulatory Visit (INDEPENDENT_AMBULATORY_CARE_PROVIDER_SITE_OTHER): Payer: PPO | Admitting: Physician Assistant

## 2020-02-02 ENCOUNTER — Encounter: Payer: Self-pay | Admitting: Physician Assistant

## 2020-02-02 VITALS — BP 110/68 | HR 76 | Temp 97.6°F | Ht 64.0 in | Wt 145.0 lb

## 2020-02-02 DIAGNOSIS — E1140 Type 2 diabetes mellitus with diabetic neuropathy, unspecified: Secondary | ICD-10-CM | POA: Diagnosis not present

## 2020-02-02 DIAGNOSIS — T451X5A Adverse effect of antineoplastic and immunosuppressive drugs, initial encounter: Secondary | ICD-10-CM

## 2020-02-02 DIAGNOSIS — R6889 Other general symptoms and signs: Secondary | ICD-10-CM

## 2020-02-02 DIAGNOSIS — M1000 Idiopathic gout, unspecified site: Secondary | ICD-10-CM | POA: Diagnosis not present

## 2020-02-02 DIAGNOSIS — Z95828 Presence of other vascular implants and grafts: Secondary | ICD-10-CM | POA: Diagnosis not present

## 2020-02-02 DIAGNOSIS — Z0001 Encounter for general adult medical examination with abnormal findings: Secondary | ICD-10-CM | POA: Diagnosis not present

## 2020-02-02 DIAGNOSIS — I10 Essential (primary) hypertension: Secondary | ICD-10-CM | POA: Diagnosis not present

## 2020-02-02 DIAGNOSIS — E785 Hyperlipidemia, unspecified: Secondary | ICD-10-CM | POA: Diagnosis not present

## 2020-02-02 DIAGNOSIS — E1122 Type 2 diabetes mellitus with diabetic chronic kidney disease: Secondary | ICD-10-CM | POA: Diagnosis not present

## 2020-02-02 DIAGNOSIS — G620 Drug-induced polyneuropathy: Secondary | ICD-10-CM | POA: Diagnosis not present

## 2020-02-02 DIAGNOSIS — R8290 Unspecified abnormal findings in urine: Secondary | ICD-10-CM | POA: Diagnosis not present

## 2020-02-02 DIAGNOSIS — E559 Vitamin D deficiency, unspecified: Secondary | ICD-10-CM

## 2020-02-02 DIAGNOSIS — Z23 Encounter for immunization: Secondary | ICD-10-CM

## 2020-02-02 DIAGNOSIS — C50212 Malignant neoplasm of upper-inner quadrant of left female breast: Secondary | ICD-10-CM | POA: Diagnosis not present

## 2020-02-02 DIAGNOSIS — Z79899 Other long term (current) drug therapy: Secondary | ICD-10-CM | POA: Diagnosis not present

## 2020-02-02 DIAGNOSIS — E1169 Type 2 diabetes mellitus with other specified complication: Secondary | ICD-10-CM

## 2020-02-02 DIAGNOSIS — Z0000 Encounter for general adult medical examination without abnormal findings: Secondary | ICD-10-CM

## 2020-02-02 DIAGNOSIS — N183 Chronic kidney disease, stage 3 unspecified: Secondary | ICD-10-CM

## 2020-02-02 DIAGNOSIS — Z170 Estrogen receptor positive status [ER+]: Secondary | ICD-10-CM

## 2020-02-02 DIAGNOSIS — E782 Mixed hyperlipidemia: Secondary | ICD-10-CM

## 2020-02-02 NOTE — Patient Instructions (Addendum)
[employer] with no obligation # [phone] Do not have to be a member Tues-Sat 10-6  [place]- free test with no obligation # [phone] MUST BE A MEMBER Call for store hours.    VAGINAL DRYNESS OVERVIEW  Vaginal dryness, also known as atrophic vaginitis, is a common condition in postmenopausal women. This condition is also common in women who have had both ovaries removed at the time of hysterectomy.   Some women have uncomfortable symptoms of vaginal dryness, such as pain with sex, burning vaginal discomfort or itching, or abnormal vaginal discharge, while others have no symptoms at all.  VAGINAL DRYNESS CAUSES   Estrogen helps to keep the vagina moist and to maintain thickness of the vaginal lining. Vaginal dryness occurs when the ovaries produce a decreased amount of estrogen. This can occur at certain times in a woman's life, and may be permanent or temporary. Times when less estrogen is made include: ?At the time of menopause. ?After surgical removal of the ovaries, chemotherapy, or radiation therapy of the pelvis for cancer. ?After having a baby, particularly in women who breastfeed. ?While using certain medications, such as danazol, medroxyprogesterone (brand names: Provera or DepoProvera), leuprolide (brand name: Lupron), or nafarelin. When these medications are stopped, estrogen production resumes.  Women who smoke cigarettes have been shown to have an increased risk of an earlier menopause transition as compared to non-smokers. Therefore, atrophic vaginitis symptoms may appear at a younger age in this population.  VAGINAL DRYNESS TREATMENT   There are three treatment options for women with vaginal dryness:  Vaginal lubricants and moisturizers - Vaginal lubricants and moisturizers can be purchased without a prescription. These products do not contain any hormones and have virtually no side effects. - Albolene is found in the facial cleanser  section at [facility], [facility], or [facility]. It is a large jar with a blue top. This is the best lubricant for women because it is hypoallergenic. -Natural lubricants, such as olive, avocado or peanut oil, are easily available products that may be used as a lubricant with sex.  -Vaginal moisturizes (eg, Replens, Moist Again, Vagisil, K-Y Silk-E, and Feminease) are formulated to allow water to be retained in the vaginal tissues. Moisturizers are applied into the vagina three times weekly to allow a continued moisturizing effect. These should not be used just before having sex, as they can be irritating.  Vaginal estrogen - Vaginal estrogen is the most effective treatment option for women with vaginal dryness. Vaginal estrogen must be prescribed by a healthcare provider. Very low doses of vaginal estrogen can be used when it is put into the vagina to treat vaginal dryness. A small amount of estrogen is absorbed into the bloodstream, but only about 100 times less than when using estrogen pills or tablets. As a result, there is a much lower risk of side effects, such as blood clots, breast cancer, and heart attack, compared with other estrogen-containing products (birth control pills, menopausal hormone therapy).     VENOUS INSUFFICIENCY Our lower leg venous system is not the most reliable, the heart does NOT pump fluid up, there is a valve system.  The muscles of the leg squeeze and the blood moves up and a valve opens and close, then they squeeze, blood moves up and valves open and closes keeping the blood moving towards the heart.  Lots can go wrong with this valve system.  If someone is sitting or standing without movement, everyone will get swelling.  THINGS TO DO:  Do not stand or sit in one position for long periods of time. Do not sit with your legs crossed. Rest with your legs raised during the day.  Your legs have to be higher than your heart so that gravity will force the valves to open, so  please really elevate your legs.   Wear elastic stockings or support hose. Do not wear other tight, encircling garments around the legs, pelvis, or waist.  [employer]  has a wide variety of well priced compression stockings. [place], [place] [address] [phone] has a good cheap selection, I like the socks, they are not as hard to get on  Walk as much as possible to increase blood flow.  Raise the foot of your bed at night with 2-inch blocks.  SEEK MEDICAL CARE IF:   The skin around your ankle starts to break down.  You have pain, redness, tenderness, or hard swelling developing in your leg over a vein.  You are uncomfortable due to leg pain.  If you ever have shortness of breath with exertion or chest pain go to the ER.  Ask insurance and pharmacy about shingrix - new vaccine   Can go to [email or web address] for more information  Shingrix Vaccination  Two vaccines are licensed and recommended to prevent shingles in the [place] Zoster vaccine live (ZVL, Zostavax) has been in use since 2006. Recombinant zoster vaccine (RZV, Shingrix), has been in use since 2017 and is recommended by ACIP as the preferred shingles vaccine.  What Everyone Should Know about Shingles Vaccine (Shingrix) One of the Recommended Vaccines by Disease Shingles vaccination is the only way to protect against shingles and postherpetic neuralgia (PHN), the most common complication from shingles. CDC recommends that healthy adults 50 years and older get two doses of the shingles vaccine called Shingrix (recombinant zoster vaccine), separated by 2 to 6 months, to prevent shingles and the complications from the disease. Your doctor or pharmacist can give you Shingrix as a shot in your upper arm. Shingrix provides strong protection against shingles and PHN. Two doses of Shingrix is more than 90% effective at preventing shingles and PHN.  Protection stays above 85% for at least the first four years after you get vaccinated. Shingrix is the preferred vaccine, over Zostavax (zoster vaccine live), a shingles vaccine in use since 2006. Zostavax may still be used to prevent shingles in healthy adults 60 years and older. For example, you could use Zostavax if a person is allergic to Shingrix, prefers Zostavax, or requests immediate vaccination and Shingrix is unavailable. Who Should Get Shingrix? Healthy adults 50 years and older should get two doses of Shingrix, separated by 2 to 6 months. You should get Shingrix even if in the past you . had shingles  . received Zostavax  . are not sure if you had chickenpox There is no maximum age for getting Shingrix. If you had shingles in the past, you can get Shingrix to help prevent future occurrences of the disease. There is no specific length of time that you need to wait after having shingles before you can receive Shingrix, but generally you should make sure the shingles rash has gone away before getting vaccinated. You can get Shingrix whether or not you remember having had chickenpox in the past. Studies show that more than 99% of Americans 40 years and older have had chickenpox, even if they don't remember having the disease. Chickenpox and shingles are  related because they are caused by the same virus (varicella zoster virus). After a person recovers from chickenpox, the virus stays dormant (inactive) in the body. It can reactivate years later and cause shingles. If you had Zostavax in the recent past, you should wait at least eight weeks before getting Shingrix. Talk to your healthcare provider to determine the best time to get Shingrix. Shingrix is available in [employer] and pharmacies. To find doctor's offices or pharmacies near you that offer the vaccine, visit HealthMap Vaccine FinderExternal. If you have questions about Shingrix, talk with your healthcare provider. Vaccine for  Those 65 Years and Older  Shingrix reduces the risk of shingles and PHN by more than 90% in people 50 and older. CDC recommends the vaccine for healthy adults 58 and older.  Who Should Not Get Shingrix? You should not get Shingrix if you: . have ever had a severe allergic reaction to any component of the vaccine or after a dose of Shingrix  . tested negative for immunity to varicella zoster virus. If you test negative, you should get chickenpox vaccine.  . currently have shingles  . currently are pregnant or breastfeeding. Women who are pregnant or breastfeeding should wait to get Shingrix.  Marland Kitchen receive specific antiviral drugs (acyclovir, famciclovir, or valacyclovir) 24 hours before vaccination (avoid use of these antiviral drugs for 14 days after vaccination)- zoster vaccine live only If you have a minor acute (starts suddenly) illness, such as a cold, you may get Shingrix. But if you have a moderate or severe acute illness, you should usually wait until you recover before getting the vaccine. This includes anyone with a temperature of 101.51F or higher. The side effects of the Shingrix are temporary, and usually last 2 to 3 days. While you may experience pain for a few days after getting Shingrix, the pain will be less severe than having shingles and the complications from the disease. How Well Does Shingrix Work? Two doses of Shingrix provides strong protection against shingles and postherpetic neuralgia (PHN), the most common complication of shingles. . In adults 82 to 82 years old who got two doses, Shingrix was 97% effective in preventing shingles; among adults 70 years and older, Shingrix was 91% effective.  . In adults 78 to 82 years old who got two doses, Shingrix was 91% effective in preventing PHN; among adults 70 years and older, Shingrix was 89% effective. Shingrix protection remained high (more than 85%) in people 70 years and older throughout the four years following vaccination. Since  your risk of shingles and PHN increases as you get older, it is important to have strong protection against shingles in your older years. Top of Page  What Are the Possible Side Effects of Shingrix? Studies show that Shingrix is safe. The vaccine helps your body create a strong defense against shingles. As a result, you are likely to have temporary side effects from getting the shots. The side effects may affect your ability to do normal daily activities for 2 to 3 days. Most people got a sore arm with mild or moderate pain after getting Shingrix, and some also had redness and swelling where they got the shot. Some people felt tired, had muscle pain, a headache, shivering, fever, stomach pain, or nausea. About 1 out of 6 people who got Shingrix experienced side effects that prevented them from doing regular activities. Symptoms went away on their own in about 2 to 3 days. Side effects were more common in younger people. You  might have a reaction to the first or second dose of Shingrix, or both doses. If you experience side effects, you may choose to take over-the-counter pain medicine such as ibuprofen or acetaminophen. If you experience side effects from Shingrix, you should report them to the Vaccine Adverse Event Reporting System (VAERS). Your doctor might file this report, or you can do it yourself through the VAERS websiteExternal, or by calling [phone]. If you have any questions about side effects from Shingrix, talk with your doctor. The shingles vaccine does not contain thimerosal (a preservative containing mercury). Top of Page  When Should I See a Doctor Because of the Side Effects I Experience From Shingrix? In clinical trials, Shingrix was not associated with serious adverse events. In fact, serious side effects from vaccines are extremely rare. For example, for every 1 million doses of a vaccine given, only one or two people may have a severe allergic reaction. Signs of an allergic  reaction happen within minutes or hours after vaccination and include hives, swelling of the face and throat, difficulty breathing, a fast heartbeat, dizziness, or weakness. If you experience these or any other life-threatening symptoms, see a doctor right away. Shingrix causes a strong response in your immune system, so it may produce short-term side effects more intense than you are used to from other vaccines. These side effects can be uncomfortable, but they are expected and usually go away on their own in 2 or 3 days. Top of Page  How Can I Pay For Shingrix? There are several ways shingles vaccine may be paid for: Medicare . Medicare Part D plans cover the shingles vaccine, but there may be a cost to you depending on your plan. There may be a copay for the vaccine, or you may need to pay in full then get reimbursed for a certain amount.  . Medicare Part B does not cover the shingles vaccine. Medicaid . Medicaid may or may not cover the vaccine. Contact your insurer to find out. Private health insurance . Many private health insurance plans will cover the vaccine, but there may be a cost to you depending on your plan. Contact your insurer to find out. Vaccine assistance programs . Some pharmaceutical companies provide vaccines to eligible adults who cannot afford them. You may want to check with the vaccine manufacturer, [employer], about Shingrix. If you do not currently have health insurance, learn more about affordable health coverage optionsExternal. To find doctor's offices or pharmacies near you that offer the vaccine, visit HealthMap Vaccine FinderExternal.

## 2020-02-03 LAB — CBC WITH DIFFERENTIAL/PLATELET
Absolute Monocytes: 574 cells/uL (ref 200–950)
Basophils Absolute: 59 cells/uL (ref 0–200)
Basophils Relative: 0.9 %
Eosinophils Absolute: 290 cells/uL (ref 15–500)
Eosinophils Relative: 4.4 %
HCT: 39.7 % (ref 35.0–45.0)
Hemoglobin: 12.6 g/dL (ref 11.7–15.5)
Lymphs Abs: 2039 cells/uL (ref 850–3900)
MCH: 28.6 pg (ref 27.0–33.0)
MCHC: 31.7 g/dL — ABNORMAL LOW (ref 32.0–36.0)
MCV: 90 fL (ref 80.0–100.0)
MPV: 11.1 fL (ref 7.5–12.5)
Monocytes Relative: 8.7 %
Neutro Abs: 3637 cells/uL (ref 1500–7800)
Neutrophils Relative %: 55.1 %
Platelets: 209 10*3/uL (ref 140–400)
RBC: 4.41 10*6/uL (ref 3.80–5.10)
RDW: 14 % (ref 11.0–15.0)
Total Lymphocyte: 30.9 %
WBC: 6.6 10*3/uL (ref 3.8–10.8)

## 2020-02-03 LAB — COMPLETE METABOLIC PANEL WITH GFR
AG Ratio: 1.6 (calc) (ref 1.0–2.5)
ALT: 14 U/L (ref 6–29)
AST: 26 U/L (ref 10–35)
Albumin: 4.5 g/dL (ref 3.6–5.1)
Alkaline phosphatase (APISO): 92 U/L (ref 37–153)
BUN/Creatinine Ratio: 23 (calc) — ABNORMAL HIGH (ref 6–22)
BUN: 26 mg/dL — ABNORMAL HIGH (ref 7–25)
CO2: 28 mmol/L (ref 20–32)
Calcium: 10.4 mg/dL (ref 8.6–10.4)
Chloride: 105 mmol/L (ref 98–110)
Creat: 1.15 mg/dL — ABNORMAL HIGH (ref 0.60–0.88)
GFR, Est African American: 52 mL/min/{1.73_m2} — ABNORMAL LOW (ref >=60)
GFR, Est Non African American: 45 mL/min/{1.73_m2} — ABNORMAL LOW (ref >=60)
Globulin: 2.8 g/dL (calc) (ref 1.9–3.7)
Glucose, Bld: 84 mg/dL (ref 65–99)
Potassium: 3.7 mmol/L (ref 3.5–5.3)
Sodium: 144 mmol/L (ref 135–146)
Total Bilirubin: 0.4 mg/dL (ref 0.2–1.2)
Total Protein: 7.3 g/dL (ref 6.1–8.1)

## 2020-02-03 LAB — URINE CULTURE
MICRO NUMBER:: 10215646
Result:: NO GROWTH
SPECIMEN QUALITY:: ADEQUATE

## 2020-02-03 LAB — URINALYSIS, ROUTINE W REFLEX MICROSCOPIC
Bilirubin Urine: NEGATIVE
Glucose, UA: NEGATIVE
Hgb urine dipstick: NEGATIVE
Ketones, ur: NEGATIVE
Leukocytes,Ua: NEGATIVE
Nitrite: NEGATIVE
Protein, ur: NEGATIVE
Specific Gravity, Urine: 1.011 (ref 1.001–1.03)
pH: <=5 (ref 5.0–8.0)

## 2020-02-03 LAB — MAGNESIUM: Magnesium: 1.3 mg/dL — ABNORMAL LOW (ref 1.5–2.5)

## 2020-02-09 ENCOUNTER — Other Ambulatory Visit: Payer: Self-pay | Admitting: Internal Medicine

## 2020-02-09 DIAGNOSIS — E782 Mixed hyperlipidemia: Secondary | ICD-10-CM

## 2020-02-09 MED ORDER — EZETIMIBE 10 MG PO TABS: ORAL_TABLET | ORAL | 3 refills | Status: DC

## 2020-02-24 ENCOUNTER — Other Ambulatory Visit: Payer: Self-pay | Admitting: *Deleted

## 2020-02-24 MED ORDER — METFORMIN HCL ER 500 MG PO TB24: ORAL_TABLET | ORAL | 1 refills | Status: DC

## 2020-03-06 ENCOUNTER — Ambulatory Visit
Admission: RE | Admit: 2020-03-06 | Discharge: 2020-03-06 | Disposition: A | Discharge status: Home / Self Care | Payer: PPO | Source: Ambulatory Visit | Attending: Hematology and Oncology | Admitting: Hematology and Oncology

## 2020-03-06 ENCOUNTER — Other Ambulatory Visit: Payer: Self-pay

## 2020-03-06 DIAGNOSIS — Z853 Personal history of malignant neoplasm of breast: Secondary | ICD-10-CM | POA: Diagnosis not present

## 2020-03-06 DIAGNOSIS — R922 Inconclusive mammogram: Secondary | ICD-10-CM | POA: Diagnosis not present

## 2020-04-02 NOTE — Progress Notes (Signed)
Assessment and Plan:  Tanecia was seen today for edema and urinary incontinence.  Diagnoses and all orders for this visit:  Leg edema, right Unilateral, progressive, concerning for possible DVT with breast cancer on anastrozole vs consider pelvic outlet syndrome with new pelvic pressure and incontinence Scheduled for DVT study tomorrow AM at 9:45, send in xarelto starter pack if indicated Anticipate d/c lasix and potassium until edema etiology is explained and indicated Go to the ER if any chest pain, shortness of breath, nausea, dizziness, severe HA, changes vision/speech -     D-dimer, quantitative (not at [facility]) -     US Venous Img Lower Unilateral Right (DVT) -     CBC with Differential/Platelet -     COMPLETE METABOLIC PANEL WITH GFR -     Urinalysis, Routine w reflex microscopic  Pelvic pressure in female Urinary incontinence, unspecified type History of bladder surgery History of partial hysterectomy S/p partial hysterectomy, s/p bladder sling, hx of breast cancer on anastrozole  however with new progressive incontinence and pelvic pressure, ? Incomplete bladder emptying, with new unilateral RLE edema r/o pelvic tumor with pelvic outlet syndrome vs other bladder anatomical explanation, getting transvaginal pelvic US this afternoon after departing office, consider uro or uro/gyn for further bladder evaluation -     CBC with Differential/Platelet -     COMPLETE METABOLIC PANEL WITH GFR -     Urinalysis, Routine w reflex microscopic -     Urine Culture -     US PELVIC COMPLETE WITH TRANSVAGINAL; Future  Further disposition pending results of labs. Discussed med's effects and SE's.   Over 30 minutes of exam, counseling, chart review, and critical decision making was performed.   Future Appointments  Date Time Provider [place]  05/03/2020  9:00 AM [name], MD [facility] None  09/14/2020 11:15 AM [name], MD [facility] None     ------------------------------------------------------------------------------------------------------------------   HPI BP 114/62   Pulse 76   Temp (!) 97.2 F (36.2 C)   Resp 16   Wt 146 lb 3.2 oz (66.3 kg)   SpO2 98%   BMI 25.10 kg/m   84 y.o.female with T2DM, L breast cancer hx, htn presents for evaluation due to "lasix not working" - RLE edema, and also new progressive incontinence.   She was seen in Feb/10/2020, noted R sided ankle edema, was initiated on lasix 20 mg daily in AM with potassium, patient reports edema hasn't notably improved, now with edema extending above knee, subtly R>L through thigh. She has been elevating extremities, wearing compression hose, taking lasix 20 mg daily without significant improvement. Denies calf pain. Denies personal or family history of clots. She has been on daily bASA. She is on anastrozole for breast cancer. No recent injury to leg, no air travel or extended car rides. Denies dyspnea, CP, palpitations, dizziness or cough.   She also reports since starting on furosemide she has noted some dribbling of urine, was wearing pad, then approx 1 month ago she started having full incontinence episodes in the evening when she would get up to urinate at night, wearing depends.   She does report sense of incomplete bladder emptying, frequency that is ongoing since Jan. She was diagnosed with e. Coli UTI in 01/12/20 and completed course of amoxicillin with resolution confirmed by culture 02/02/20. Never had children, Hx of abdominal hysterectomy in 70s, ovaries intact, had bladder sling in 2006 by Dr. Terance Hart. No recent pelvic exams. She has been pushing fluid intake, but has sense  of incomplete emptying and fullness without distension.   Never smoker, family history of breast cancer in sister.   BMI is Body mass index is 25.1 kg/m., she has been working on diet and exercise. Wt Readings from Last 3 Encounters:  04/03/20 146 lb 3.2 oz (66.3 kg)   02/02/20 145 lb (65.8 kg)  01/12/20 145 lb 6.4 oz (66 kg)     Past Medical History:  Diagnosis Date  . Allergy   . Anemia   . Arthritis   . Blood transfusion without reported diagnosis   . Breast cancer of upper-inner quadrant of left female breast ([place]) 02/29/2016   skin- 2016- squamous- on right upper arm  . Diet-controlled type 2 diabetes mellitus ([place])    diet and exercsise,Has never been on meds, type II   . GERD (gastroesophageal reflux disease)   . Gout    takes Allopurinol daily  . History of chemotherapy   . History of colon polyps    benign  . History of shingles   . Hx of radiation therapy   . Hyperlipidemia   . Hypertension   . Personal history of chemotherapy    2017  . Personal history of radiation therapy    2017  . Vitamin D deficiency      Allergies  Allergen Reactions  . Keflex [Cephalexin] Swelling    Tongue and throat swells  . Meloxicam Swelling  . Prednisone Swelling    Can Not take High doses  . Premarin [Estrogens Conjugated] Hives  . Trovan [Alatrofloxacin] Other (See Comments)    Unknown reaction    Current Outpatient Medications on File Prior to Visit  Medication Sig  . acetaminophen (TYLENOL) 325 MG tablet Take 325 mg by mouth every 6 (six) hours as needed for headache.  . allopurinol (ZYLOPRIM) 300 MG tablet Take 1 tablet Daily to Prevent Gout  . anastrozole (ARIMIDEX) 1 MG tablet TAKE 1 TABLET(1 MG) BY MOUTH DAILY  . aspirin 81 MG tablet Take 81 mg by mouth daily.  . Cholecalciferol (VITAMIN D3) 5000 units CAPS Take 5,000 Units by mouth daily.  Marland Kitchen ezetimibe (ZETIA) 10 MG tablet Take 1 tablet Daily for Cholesterol  . furosemide (LASIX) 20 MG tablet Take 1 tablet Daily for BP, & Fluid Retention / Ankle Swelling  . gabapentin (NEURONTIN) 300 MG capsule Take 1 capsule 3 to 4  x /day as needed for pain  . glucose blood (FREESTYLE TEST STRIPS) test strip Check Blood Sugar Daily as directed (Dx: E11.29)  . Lancets (FREESTYLE) lancets CHECK  BLOOD SUGAR DAILY AS DIRECTED  . loratadine (CLARITIN) 10 MG tablet Take 10 mg by mouth daily as needed for allergies.  . Magnesium ([facility] MAGNESIUM) 250 MG TABS Take 500 mg by mouth 2 (two) times daily.  . metFORMIN (GLUCOPHAGE-XR) 500 MG 24 hr tablet Take 2 tablet 2 times a day for blood sugar.  . metoprolol succinate (TOPROL-XL) 25 MG 24 hr tablet Take 1 tablet Daily for BP  . potassium chloride (KLOR-CON) 10 MEQ tablet TAKE 1 TABLET WITH FOOD WHEN TAKING FUROSEMIDE  . rosuvastatin (CRESTOR) 5 MG tablet Take 5 mg tablet three times a week  . vitamin C (ASCORBIC ACID) 500 MG tablet Take 500 mg by mouth daily. Takes 2 tablets daily   No current facility-administered medications on file prior to visit.    Allergies:  Allergies  Allergen Reactions  . Keflex [Cephalexin] Swelling    Tongue and throat swells  . Meloxicam Swelling  . Prednisone Swelling  Can Not take High doses  . Premarin [Estrogens Conjugated] Hives  . Trovan [Alatrofloxacin] Other (See Comments)    Unknown reaction   Surgical History:  She  has a past surgical history that includes Cataract extraction, bilateral (2014); Abdominal hysterectomy (1973); Incontinence surgery (2006); Knee arthroscopy (Right); Hand surgery (Left, 2012); Colonoscopy; Radioactive seed guided mastectomy with axillary sentinel lymph node biopsy (Left, 03/18/2016); Cardiac catheterization; Portacath placement (N/A, 04/14/2016); Port-a-cath removal (N/A, 11/27/2016); Mastopexy (Right, 06/16/2017); Breast biopsy (Left, 02/26/2016); Breast lumpectomy (Left, 03/18/2016); and Reduction mammaplasty (Right, 2018).   Family History:  Herfamily history includes Alzheimer's disease in her father; Breast cancer (age of onset: 16) in her sister; Cirrhosis in her sister; Diabetes in her father; Epilepsy in some other family members; Esophageal cancer (age of onset: 7) in her maternal aunt; Heart Problems in her maternal grandmother; Heart attack (age of onset: 36)  in her maternal uncle; Hypertension in her sister; Lung cancer in an other family member; Lung cancer (age of onset: 83) in her sister; Other in her mother and sister; Other (age of onset: 87) in an other family member; Pancreatic cancer (age of onset: 70) in her cousin; Parkinson's disease in her maternal aunt; Stroke in her maternal grandmother; Stroke (age of onset: 34) in her mother. Social History:   reports that she has never smoked. She has never used smokeless tobacco. She reports that she does not drink alcohol or use drugs.   ROS: all negative except above.   Physical Exam:  BP 114/62   Pulse 76   Temp (!) 97.2 F (36.2 C)   Resp 16   Wt 146 lb 3.2 oz (66.3 kg)   SpO2 98%   BMI 25.10 kg/m   General Appearance: Well nourished, well dressed elderly female, in no apparent distress. Eyes: PERRLA, conjunctiva no swelling or erythema ENT/Mouth: Mask in place; Hearing normal.  Neck: Supple Respiratory: Respiratory effort normal, BS equal bilaterally without rales, rhonchi, wheezing or stridor.  Cardio: RRR with no MRGs. Intact peripheral pulses, unilateral RLE edema, pitting 2+ through ankle, calf, thigh appears mildly enlarged above knee, no tenderness, erythema, rash; neg homan's Abdomen: Soft, + BS.  Genralized lower abdominal fullness without distinct mass or obvious bladder distension, Non tender, no guarding, rebound, hernias.  Musculoskeletal: No obvious deformity, normal gait. No knee effusion, no palpable Baker's cyst Skin: Warm, dry without rashes, lesions, ecchymosis.  Neuro: Normal muscle tone, no cerebellar symptoms. Sensation intact.  Psych: Awake and oriented X 3, normal affect, Insight and Judgment appropriate.  GU: Pelvic exam: VULVA: normal appearing vulva with no masses, tenderness or lesions, VAGINA: atrophic, CERVIX: normal appearing cervix without discharge or lesions, surgically absent, UTERUS: uterus is normal size, shape, consistency and nontender, absent,  ADNEXA: non-palpable. Pelvic pressure and discomfort limiting bimanual exam. Generalized pelvic pressure with manual exam, no appreciable cystocele/rectocele    Izora Ribas, NP 11:47 AM [facility]

## 2020-04-03 ENCOUNTER — Other Ambulatory Visit: Payer: Self-pay

## 2020-04-03 ENCOUNTER — Ambulatory Visit
Admission: RE | Admit: 2020-04-03 | Discharge: 2020-04-03 | Disposition: A | Discharge status: Home / Self Care | Payer: PPO | Source: Ambulatory Visit | Attending: Adult Health | Admitting: Adult Health

## 2020-04-03 ENCOUNTER — Encounter: Payer: Self-pay | Admitting: Adult Health

## 2020-04-03 ENCOUNTER — Ambulatory Visit (INDEPENDENT_AMBULATORY_CARE_PROVIDER_SITE_OTHER): Payer: PPO | Admitting: Adult Health

## 2020-04-03 VITALS — BP 114/62 | HR 76 | Temp 97.2°F | Resp 16 | Wt 146.2 lb

## 2020-04-03 DIAGNOSIS — Z9889 Other specified postprocedural states: Secondary | ICD-10-CM | POA: Diagnosis not present

## 2020-04-03 DIAGNOSIS — R32 Unspecified urinary incontinence: Secondary | ICD-10-CM

## 2020-04-03 DIAGNOSIS — Z90711 Acquired absence of uterus with remaining cervical stump: Secondary | ICD-10-CM

## 2020-04-03 DIAGNOSIS — R600 Localized edema: Secondary | ICD-10-CM | POA: Diagnosis not present

## 2020-04-03 DIAGNOSIS — N838 Other noninflammatory disorders of ovary, fallopian tube and broad ligament: Secondary | ICD-10-CM | POA: Diagnosis not present

## 2020-04-03 DIAGNOSIS — R102 Pelvic and perineal pain: Secondary | ICD-10-CM | POA: Diagnosis not present

## 2020-04-03 LAB — D-DIMER, QUANTITATIVE: D-Dimer, Quant: 10.83 mcg/mL FEU — ABNORMAL HIGH (ref <0.50)

## 2020-04-03 NOTE — Patient Instructions (Addendum)
After emptying bladder, please drink 32 ounces of water between 12:30-1pm and present to [address] imaging center at 1 pm for your ultrasound appointment.   Pending scheduling leg ultrasound this afternoon or tomorrow morning - please keep your phone on and close to you   Go to the ER if any chest pain, shortness of breath, nausea, dizziness, severe HA, changes vision/speech   Urinary Incontinence  Urinary incontinence refers to a condition in which a person is unable to control where and when to pass urine. A person with this condition will urinate when he or she does not mean to (involuntarily). What are the causes? This condition may be caused by:  Medicines.  Infections.  Constipation.  Overactive bladder muscles.  Weak bladder muscles.  Weak pelvic floor muscles. These muscles provide support for the bladder, intestine, and, in women, the uterus.  Enlarged prostate in men. The prostate is a gland near the bladder. When it gets too big, it can pinch the urethra. With the urethra blocked, the bladder can weaken and lose the ability to empty properly.  Surgery.  Emotional factors, such as anxiety, stress, or post-traumatic stress disorder (PTSD).  Pelvic organ prolapse. This happens in women when organs shift out of place and into the vagina. This shift can prevent the bladder and urethra from working properly. What increases the risk? The following factors may make you more likely to develop this condition:  Older age.  Obesity and physical inactivity.  Pregnancy and childbirth.  Menopause.  Diseases that affect the nerves or spinal cord (neurological diseases).  Long-term (chronic) coughing. This can increase pressure on the bladder and pelvic floor muscles. What are the signs or symptoms? Symptoms may vary depending on the type of urinary incontinence you have. They include:  A sudden urge to urinate, but passing urine involuntarily before you can  get to a bathroom (urge incontinence).  Suddenly passing urine with any activity that forces urine to pass, such as coughing, laughing, exercise, or sneezing (stress incontinence).  Needing to urinate often, but urinating only a small amount, or constantly dribbling urine (overflow incontinence).  Urinating because you cannot get to the bathroom in time due to a physical disability, such as arthritis or injury, or communication and thinking problems, such as Alzheimer disease (functional incontinence). How is this diagnosed? This condition may be diagnosed based on:  Your medical history.  A physical exam.  Tests, such as: ? Urine tests. ? X-rays of your kidney and bladder. ? Ultrasound. ? CT scan. ? Cystoscopy. In this procedure, a health care provider inserts a tube with a light and camera (cystoscope) through the urethra and into the bladder in order to check for problems. ? Urodynamic testing. These tests assess how well the bladder, urethra, and sphincter can store and release urine. There are different types of urodynamic tests, and they vary depending on what the test is measuring. To help diagnose your condition, your health care provider may recommend that you keep a log of when you urinate and how much you urinate. How is this treated? Treatment for this condition depends on the type of incontinence that you have and its cause. Treatment may include:  Lifestyle changes, such as: ? Quitting smoking. ? Maintaining a healthy weight. ? Staying active. Try to get 150 minutes of moderate-intensity exercise every week. Ask your health care provider which activities are safe for you. ? Eating a healthy diet.  Avoid high-fat foods, like fried foods.  Avoid  refined carbohydrates like white bread and white rice.  Limit how much alcohol and caffeine you drink.  Increase your fiber intake. Foods such as fresh fruits, vegetables, beans, and whole grains are healthy sources of  fiber.  Pelvic floor muscle exercises.  Bladder training, such as lengthening the amount of time between bathroom breaks, or using the bathroom at regular intervals.  Using techniques to suppress bladder urges. This can include distraction techniques or controlled breathing exercises.  Medicines to relax the bladder muscles and prevent bladder spasms.  Medicines to help slow or prevent the growth of a man's prostate.  Botox injections. These can help relax the bladder muscles.  Using pulses of electricity to help change bladder reflexes (electrical nerve stimulation).  For women, using a medical device to prevent urine leaks. This is a small, tampon-like, disposable device that is inserted into the urethra.  Injecting collagen or carbon beads (bulking agents) into the urinary sphincter. These can help thicken tissue and close the bladder opening.  Surgery. Follow these instructions at home: Lifestyle  Limit alcohol and caffeine. These can fill your bladder quickly and irritate it.  Keep yourself clean to help prevent odors and skin damage. Ask your doctor about special skin creams and cleansers that can protect the skin from urine.  Consider wearing pads or adult diapers. Make sure to change them regularly, and always change them right after experiencing incontinence. General instructions  Take over-the-counter and prescription medicines only as told by your health care provider.  Use the bathroom about every 3-4 hours, even if you do not feel the need to urinate. Try to empty your bladder completely every time. After urinating, wait a minute. Then try to urinate again.  Make sure you are in a relaxed position while urinating.  If your incontinence is caused by nerve problems, keep a log of the medicines you take and the times you go to the bathroom.  Keep all follow-up visits as told by your health care provider. This is important. Contact a health care provider if:  You  have pain that gets worse.  Your incontinence gets worse. Get help right away if:  You have a fever or chills.  You are unable to urinate.  You have redness in your groin area or down your legs. Summary  Urinary incontinence refers to a condition in which a person is unable to control where and when to pass urine.  This condition may be caused by medicines, infection, weak bladder muscles, weak pelvic floor muscles, enlargement of the prostate (in men), or surgery.  The following factors increase your risk for developing this condition: older age, obesity, pregnancy and childbirth, menopause, neurological diseases, and chronic coughing.  There are several types of urinary incontinence. They include urge incontinence, stress incontinence, overflow incontinence, and functional incontinence.  This condition is usually treated first with lifestyle and behavioral changes, such as quitting smoking, eating a healthier diet, and doing regular pelvic floor exercises. Other treatment options include medicines, bulking agents, medical devices, electrical nerve stimulation, or surgery. This information is not intended to replace advice given to you by your health care provider. Make sure you discuss any questions you have with your health care provider. Document Revised: 11/27/2017 Document Reviewed: 02/26/2017 Elsevier Patient Education  [place].

## 2020-04-04 ENCOUNTER — Other Ambulatory Visit: Payer: Self-pay | Admitting: Adult Health

## 2020-04-04 ENCOUNTER — Ambulatory Visit
Admission: RE | Admit: 2020-04-04 | Discharge: 2020-04-04 | Disposition: A | Discharge status: Home / Self Care | Payer: PPO | Source: Ambulatory Visit | Attending: Adult Health | Admitting: Adult Health

## 2020-04-04 ENCOUNTER — Encounter: Payer: Self-pay | Admitting: Adult Health

## 2020-04-04 DIAGNOSIS — N838 Other noninflammatory disorders of ovary, fallopian tube and broad ligament: Secondary | ICD-10-CM | POA: Insufficient documentation

## 2020-04-04 DIAGNOSIS — R32 Unspecified urinary incontinence: Secondary | ICD-10-CM | POA: Insufficient documentation

## 2020-04-04 DIAGNOSIS — R600 Localized edema: Secondary | ICD-10-CM | POA: Diagnosis not present

## 2020-04-04 DIAGNOSIS — E876 Hypokalemia: Secondary | ICD-10-CM

## 2020-04-04 DIAGNOSIS — R102 Pelvic and perineal pain: Secondary | ICD-10-CM

## 2020-04-04 LAB — COMPLETE METABOLIC PANEL WITH GFR
AG Ratio: 1.6 (calc) (ref 1.0–2.5)
ALT: 11 U/L (ref 6–29)
AST: 27 U/L (ref 10–35)
Albumin: 4.2 g/dL (ref 3.6–5.1)
Alkaline phosphatase (APISO): 91 U/L (ref 37–153)
BUN/Creatinine Ratio: 23 (calc) — ABNORMAL HIGH (ref 6–22)
BUN: 31 mg/dL — ABNORMAL HIGH (ref 7–25)
CO2: 28 mmol/L (ref 20–32)
Calcium: 10.2 mg/dL (ref 8.6–10.4)
Chloride: 105 mmol/L (ref 98–110)
Creat: 1.37 mg/dL — ABNORMAL HIGH (ref 0.60–0.88)
GFR, Est African American: 42 mL/min/{1.73_m2} — ABNORMAL LOW (ref >=60)
GFR, Est Non African American: 36 mL/min/{1.73_m2} — ABNORMAL LOW (ref >=60)
Globulin: 2.6 g/dL (calc) (ref 1.9–3.7)
Glucose, Bld: 83 mg/dL (ref 65–99)
Potassium: 4.4 mmol/L (ref 3.5–5.3)
Sodium: 141 mmol/L (ref 135–146)
Total Bilirubin: 0.4 mg/dL (ref 0.2–1.2)
Total Protein: 6.8 g/dL (ref 6.1–8.1)

## 2020-04-04 LAB — URINALYSIS, ROUTINE W REFLEX MICROSCOPIC
Bilirubin Urine: NEGATIVE
Glucose, UA: NEGATIVE
Hgb urine dipstick: NEGATIVE
Leukocytes,Ua: NEGATIVE
Nitrite: NEGATIVE
Protein, ur: NEGATIVE
Specific Gravity, Urine: 1.018 (ref 1.001–1.03)
pH: <=5 (ref 5.0–8.0)

## 2020-04-04 LAB — CBC WITH DIFFERENTIAL/PLATELET
Absolute Monocytes: 767 cells/uL (ref 200–950)
Basophils Absolute: 73 cells/uL (ref 0–200)
Basophils Relative: 1 %
Eosinophils Absolute: 183 cells/uL (ref 15–500)
Eosinophils Relative: 2.5 %
HCT: 36.1 % (ref 35.0–45.0)
Hemoglobin: 11.3 g/dL — ABNORMAL LOW (ref 11.7–15.5)
Lymphs Abs: 1825 cells/uL (ref 850–3900)
MCH: 28.7 pg (ref 27.0–33.0)
MCHC: 31.3 g/dL — ABNORMAL LOW (ref 32.0–36.0)
MCV: 91.6 fL (ref 80.0–100.0)
MPV: 11 fL (ref 7.5–12.5)
Monocytes Relative: 10.5 %
Neutro Abs: 4453 cells/uL (ref 1500–7800)
Neutrophils Relative %: 61 %
Platelets: 236 10*3/uL (ref 140–400)
RBC: 3.94 10*6/uL (ref 3.80–5.10)
RDW: 14.1 % (ref 11.0–15.0)
Total Lymphocyte: 25 %
WBC: 7.3 10*3/uL (ref 3.8–10.8)

## 2020-04-04 LAB — URINE CULTURE
MICRO NUMBER:: 10438253
SPECIMEN QUALITY:: ADEQUATE

## 2020-04-04 MED ORDER — POTASSIUM CHLORIDE ER 10 MEQ PO TBCR: EXTENDED_RELEASE_TABLET | ORAL | 1 refills | Status: DC

## 2020-04-04 MED ORDER — FUROSEMIDE 20 MG PO TABS: ORAL_TABLET | ORAL | 0 refills | Status: DC

## 2020-04-05 ENCOUNTER — Encounter: Payer: Self-pay | Admitting: Adult Health

## 2020-04-05 DIAGNOSIS — R600 Localized edema: Secondary | ICD-10-CM | POA: Insufficient documentation

## 2020-04-06 ENCOUNTER — Other Ambulatory Visit: Payer: Self-pay | Admitting: Internal Medicine

## 2020-04-06 DIAGNOSIS — E1169 Type 2 diabetes mellitus with other specified complication: Secondary | ICD-10-CM

## 2020-04-06 DIAGNOSIS — E785 Hyperlipidemia, unspecified: Secondary | ICD-10-CM

## 2020-04-06 MED ORDER — ROSUVASTATIN CALCIUM 5 MG PO TABS: ORAL_TABLET | ORAL | 0 refills | Status: DC

## 2020-04-09 NOTE — Progress Notes (Signed)
Assessment and Plan:  1. Leg edema, right Negative DVT study 04/04/2020 Imporved with lasix 40 mg, has increased potassium  ? R/t newly diagnosed pelvic/ovarian mass, may not resolve until this is removed However with DVT ruled out, if significant decline in renal functions will d/c lasix and monitor with lifestyle interventions only Discussed elevation, compression Go to the ER if any chest pain, shortness of breath, nausea, dizziness, severe HA, changes vision/speech  2. Mass of left ovary Pending MRI 04/15/2020, will refer on to Dr. [name] as indicated Per discussion with Dr. Melford Aase will defer lab workup for ovarian mass to cancer center  3. Incontinence of urine in female Likely r/o pelvic mass; she is trailing myrbetriq with perceived benefit, continue  4. Need for tetanus booster Patient requested overdue tetanus booster be administered today, no complications -     Td : Tetanus/diphtheria >7yo Preservative  free  Further disposition pending results of labs. Discussed med's effects and SE's.   Over 30 minutes of exam, counseling, chart review, and critical decision making was performed.   Future Appointments  Date Time Provider [place]  04/15/2020 10:20 AM [facility] MR 3 [facility] [address] [facility]  05/03/2020  9:00 AM [name], MD [facility] None  09/14/2020 11:15 AM [name], MD [facility] None    ------------------------------------------------------------------------------------------------------------------   HPI BP 118/62   Pulse 79   Temp (!) 97.5 F (36.4 C)   Resp 16   Wt 145 lb 9.6 oz (66 kg)   SpO2 99%   BMI 24.99 kg/m   84 y.o.female with hx of breast cancer, T2DM, htn, CKD 3 presents for follow up on RLE edema.   She reported unilateral RLE edema that began in Jan of this year, persistent and progressive, lasix 20 mg was added by another provider without much improvement. DVT study was completed 04/04/2020 which was negative. Lasix was  increased to 40 mg daily with potassium supplement per Dr. Idell Pickles recommendation and she presents for 1 week follow up. She reports has noted some improvement in edema with this increase, continues to wear compression hose and elevate extremities.  She had also reported concurrent symptoms of pelvic pressure and progressive episodes of incontinence (worse at night in last month); transvaginal ultrasound was ordered last visit which unfortunately showed a complex cystic and solid mass in LEFT adnexa 12.5 cm diameter question cystic ovarian neoplasm; recommend correlation with serum tumor markers and further evaluation by MR imaging with and without contrast which has been ordered and scheduled for 04/15/2020. Some question that this edema may be related to this mass and may not improve without surgical intervention of mass. Myrbetriq samples were given to trial, has been taking 25 mg one week with perceived improvement in frequency.    Lab Results  Component Value Date   GFRNONAA 36 (L) 04/03/2020   GFRNONAA 45 (L) 02/02/2020   GFRNONAA 36 (L) 12/12/2019   Lab Results  Component Value Date   CREATININE 1.37 (H) 04/03/2020   CREATININE 1.15 (H) 02/02/2020   CREATININE 1.38 (H) 12/12/2019     Past Medical History:  Diagnosis Date  . Allergy   . Anemia   . Arthritis   . Blood transfusion without reported diagnosis   . Breast cancer of upper-inner quadrant of left female breast ([place]) 02/29/2016   skin- 2016- squamous- on right upper arm  . Diet-controlled type 2 diabetes mellitus ([place])    diet and exercsise,Has never been on meds, type II   . GERD (gastroesophageal  reflux disease)   . Gout    takes Allopurinol daily  . History of chemotherapy   . History of colon polyps    benign  . History of shingles   . Hx of radiation therapy   . Hyperlipidemia   . Hypertension   . Personal history of chemotherapy    2017  . Personal history of radiation therapy    2017  . Vitamin D  deficiency      Allergies  Allergen Reactions  . Keflex [Cephalexin] Swelling    Tongue and throat swells  . Meloxicam Swelling  . Prednisone Swelling    Can Not take High doses  . Premarin [Estrogens Conjugated] Hives  . Trovan [Alatrofloxacin] Other (See Comments)    Unknown reaction    Current Outpatient Medications on File Prior to Visit  Medication Sig  . acetaminophen (TYLENOL) 325 MG tablet Take 325 mg by mouth every 6 (six) hours as needed for headache.  . allopurinol (ZYLOPRIM) 300 MG tablet Take 1 tablet Daily to Prevent Gout  . anastrozole (ARIMIDEX) 1 MG tablet TAKE 1 TABLET(1 MG) BY MOUTH DAILY  . aspirin 81 MG tablet Take 81 mg by mouth daily.  . Cholecalciferol (VITAMIN D3) 5000 units CAPS Take 5,000 Units by mouth daily.  Marland Kitchen ezetimibe (ZETIA) 10 MG tablet Take 1 tablet Daily for Cholesterol  . furosemide (LASIX) 20 MG tablet Take 2 tablet Daily for leg swelling and blood pressure  . gabapentin (NEURONTIN) 300 MG capsule Take 1 capsule 3 to 4  x /day as needed for pain  . glucose blood (FREESTYLE TEST STRIPS) test strip Check Blood Sugar Daily as directed (Dx: E11.29)  . Lancets (FREESTYLE) lancets CHECK BLOOD SUGAR DAILY AS DIRECTED  . loratadine (CLARITIN) 10 MG tablet Take 10 mg by mouth daily as needed for allergies.  . Magnesium ([employer] MAGNESIUM) 250 MG TABS Take 500 mg by mouth 2 (two) times daily.  . metFORMIN (GLUCOPHAGE-XR) 500 MG 24 hr tablet Take 2 tablet 2 times a day for blood sugar.  . metoprolol succinate (TOPROL-XL) 25 MG 24 hr tablet Take 1 tablet Daily for BP  . potassium chloride (KLOR-CON) 10 MEQ tablet TAKE 2 TABLET WITH FOOD WHEN TAKING FUROSEMIDE  . rosuvastatin (CRESTOR) 5 MG tablet Take 1 tablet 3 x /week on - Mon Wed Fri - for Cholesterol  . vitamin C (ASCORBIC ACID) 500 MG tablet Take 500 mg by mouth daily. Takes 2 tablets daily   No current facility-administered medications on file prior to visit.    ROS: all negative except above.    Physical Exam:  BP 118/62   Pulse 79   Temp (!) 97.5 F (36.4 C)   Resp 16   Wt 145 lb 9.6 oz (66 kg)   SpO2 99%   BMI 24.99 kg/m   General Appearance: Well nourished, in no apparent distress. Eyes: PERRLA, EOMs, conjunctiva no swelling or erythema Sinuses: No Frontal/maxillary tenderness ENT/Mouth: Ext aud canals clear, TMs without erythema, bulging. No erythema, swelling, or exudate on post pharynx.  Tonsils not swollen or erythematous. Hearing normal.  Neck: Supple, thyroid normal.  Respiratory: Respiratory effort normal, BS equal bilaterally without rales, rhonchi, wheezing or stridor.  Cardio: RRR with no MRGs. Brisk peripheral pulses with unilateral R lower leg/ankle pitting edema 2+, improved from previous, without erythema or tenderness  Abdomen: Mildly firm, + BS.  Non tender, no guarding, generalized lower abdominal fullness Lymphatics: Non tender without lymphadenopathy.  Musculoskeletal: Full ROM, 5/5 strength, normal  gait.  Skin: Warm, dry without rashes, lesions, ecchymosis.  Neuro: Cranial nerves intact. Normal muscle tone, no cerebellar symptoms. Sensation intact.  Psych: Awake and oriented X 3, normal affect, Insight and Judgment appropriate.     Izora Ribas, NP 10:42 AM [facility]

## 2020-04-11 ENCOUNTER — Other Ambulatory Visit: Payer: Self-pay

## 2020-04-11 ENCOUNTER — Ambulatory Visit (INDEPENDENT_AMBULATORY_CARE_PROVIDER_SITE_OTHER): Payer: PPO | Admitting: Adult Health

## 2020-04-11 ENCOUNTER — Encounter: Payer: Self-pay | Admitting: Adult Health

## 2020-04-11 VITALS — BP 118/62 | HR 79 | Temp 97.5°F | Resp 16 | Wt 145.6 lb

## 2020-04-11 DIAGNOSIS — Z79899 Other long term (current) drug therapy: Secondary | ICD-10-CM | POA: Diagnosis not present

## 2020-04-11 DIAGNOSIS — R600 Localized edema: Secondary | ICD-10-CM | POA: Diagnosis not present

## 2020-04-11 DIAGNOSIS — N838 Other noninflammatory disorders of ovary, fallopian tube and broad ligament: Secondary | ICD-10-CM

## 2020-04-11 DIAGNOSIS — Z23 Encounter for immunization: Secondary | ICD-10-CM | POA: Diagnosis not present

## 2020-04-11 DIAGNOSIS — R32 Unspecified urinary incontinence: Secondary | ICD-10-CM | POA: Diagnosis not present

## 2020-04-11 NOTE — Patient Instructions (Signed)
Td (Tetanus, Diphtheria) Vaccine: What You Need to Know 1. Why get vaccinated? Td vaccine can prevent tetanus and diphtheria. Tetanus enters the body through cuts or wounds. Diphtheria spreads from person to person.  TETANUS (T) causes painful stiffening of the muscles. Tetanus can lead to serious health problems, including being unable to open the mouth, having trouble swallowing and breathing, or death.  DIPHTHERIA (D) can lead to difficulty breathing, heart failure, paralysis, or death. 2. Td vaccine Td is only for children 7 years and older, adolescents, and adults.  Td is usually given as a booster dose every 10 years, but it can also be given earlier after a severe and dirty wound or burn. Another vaccine, called Tdap, that protects against pertussis, also known as "whooping cough," in addition to tetanus and diphtheria, may be used instead of Td.  Td may be given at the same time as other vaccines. 3. Talk with your health care provider Tell your vaccine provider if the person getting the vaccine:  Has had an allergic reaction after a previous dose of any vaccine that protects against tetanus or diphtheria, or has any severe, life-threatening allergies.  Has ever had Guillain-Barr Syndrome (also called GBS).  Has had severe pain or swelling after a previous dose of any vaccine that protects against tetanus or diphtheria. In some cases, your health care provider may decide to postpone Td vaccination to a future visit.  People with minor illnesses, such as a cold, may be vaccinated. People who are moderately or severely ill should usually wait until they recover before getting Td vaccine.  Your health care provider can give you more information. 4. Risks of a vaccine reaction  Pain, redness, or swelling where the shot was given, mild fever, headache, feeling tired, and nausea, vomiting, diarrhea, or stomachache sometimes happen after Td vaccine. People sometimes faint after medical  procedures, including vaccination. Tell your provider if you feel dizzy or have vision changes or ringing in the ears.  As with any medicine, there is a very remote chance of a vaccine causing a severe allergic reaction, other serious injury, or death. 5. What if there is a serious problem? An allergic reaction could occur after the vaccinated person leaves the clinic. If you see signs of a severe allergic reaction (hives, swelling of the face and throat, difficulty breathing, a fast heartbeat, dizziness, or weakness), call 9-1-1 and get the person to the nearest hospital.  For other signs that concern you, call your health care provider.  Adverse reactions should be reported to the Vaccine Adverse Event Reporting System (VAERS). Your health care provider will usually file this report, or you can do it yourself. Visit the VAERS website at www.vaers.SamedayNews.es or call 769-844-8013. VAERS is only for reporting reactions, and VAERS staff do not give medical advice. 6. The National Vaccine Injury Compensation Program The [employer] Vaccine Injury Compensation Program (VICP) is a federal program that was created to compensate people who may have been injured by certain vaccines. Visit the VICP website at GoldCloset.com.ee or call (815)259-7862 to learn about the program and about filing a claim. There is a time limit to file a claim for compensation. 7. How can I learn more?  Ask your health care provider.  Call your local or state health department.  Contact the Centers for Disease Control and Prevention (CDC): ? Call 831-379-3276 (1-800-CDC-INFO) or ? Visit CDC's website at http://hunter.com/ Vaccine Information Statement Td Vaccine (03/02/19) This information is not intended to replace advice given  to you by your health care provider. Make sure you discuss any questions you have with your health care provider. Document Revised: 04/11/2019 Document Reviewed: 03/14/2019 Elsevier  Patient Education  [place].

## 2020-04-12 ENCOUNTER — Other Ambulatory Visit: Payer: Self-pay | Admitting: Adult Health

## 2020-04-12 DIAGNOSIS — E876 Hypokalemia: Secondary | ICD-10-CM

## 2020-04-12 DIAGNOSIS — R600 Localized edema: Secondary | ICD-10-CM

## 2020-04-12 LAB — MAGNESIUM: Magnesium: 1.6 mg/dL (ref 1.5–2.5)

## 2020-04-12 LAB — COMPLETE METABOLIC PANEL WITH GFR
AG Ratio: 1.6 (calc) (ref 1.0–2.5)
ALT: 10 U/L (ref 6–29)
AST: 25 U/L (ref 10–35)
Albumin: 4.2 g/dL (ref 3.6–5.1)
Alkaline phosphatase (APISO): 83 U/L (ref 37–153)
BUN/Creatinine Ratio: 25 (calc) — ABNORMAL HIGH (ref 6–22)
BUN: 34 mg/dL — ABNORMAL HIGH (ref 7–25)
CO2: 27 mmol/L (ref 20–32)
Calcium: 10.2 mg/dL (ref 8.6–10.4)
Chloride: 106 mmol/L (ref 98–110)
Creat: 1.35 mg/dL — ABNORMAL HIGH (ref 0.60–0.88)
GFR, Est African American: 42 mL/min/{1.73_m2} — ABNORMAL LOW (ref >=60)
GFR, Est Non African American: 36 mL/min/{1.73_m2} — ABNORMAL LOW (ref >=60)
Globulin: 2.6 g/dL (calc) (ref 1.9–3.7)
Glucose, Bld: 83 mg/dL (ref 65–99)
Potassium: 4.7 mmol/L (ref 3.5–5.3)
Sodium: 142 mmol/L (ref 135–146)
Total Bilirubin: 0.4 mg/dL (ref 0.2–1.2)
Total Protein: 6.8 g/dL (ref 6.1–8.1)

## 2020-04-12 MED ORDER — FUROSEMIDE 40 MG PO TABS: ORAL_TABLET | ORAL | 0 refills | Status: DC

## 2020-04-12 MED ORDER — POTASSIUM CHLORIDE ER 10 MEQ PO TBCR: EXTENDED_RELEASE_TABLET | ORAL | 0 refills | Status: DC

## 2020-04-15 ENCOUNTER — Ambulatory Visit
Admission: RE | Admit: 2020-04-15 | Discharge: 2020-04-15 | Disposition: A | Discharge status: Home / Self Care | Payer: PPO | Source: Ambulatory Visit | Attending: Adult Health | Admitting: Adult Health

## 2020-04-15 DIAGNOSIS — R32 Unspecified urinary incontinence: Secondary | ICD-10-CM

## 2020-04-15 DIAGNOSIS — C569 Malignant neoplasm of unspecified ovary: Secondary | ICD-10-CM | POA: Diagnosis not present

## 2020-04-15 DIAGNOSIS — R102 Pelvic and perineal pain: Secondary | ICD-10-CM

## 2020-04-15 DIAGNOSIS — N838 Other noninflammatory disorders of ovary, fallopian tube and broad ligament: Secondary | ICD-10-CM

## 2020-04-15 MED ORDER — GADOBENATE DIMEGLUMINE 529 MG/ML IV SOLN
13.0000 mL | Freq: Once | INTRAVENOUS | Status: AC | PRN
  Administered 2020-04-15: 13 mL via INTRAVENOUS

## 2020-04-16 ENCOUNTER — Other Ambulatory Visit: Payer: Self-pay | Admitting: Adult Health

## 2020-04-16 DIAGNOSIS — R1900 Intra-abdominal and pelvic swelling, mass and lump, unspecified site: Secondary | ICD-10-CM

## 2020-04-16 MED ORDER — MIRABEGRON ER 50 MG PO TB24: 50.0000 mg | ORAL_TABLET | Freq: Every day | ORAL | 2 refills | Status: DC

## 2020-04-17 ENCOUNTER — Inpatient Hospital Stay: Payer: PPO

## 2020-04-17 ENCOUNTER — Encounter: Payer: Self-pay | Admitting: Oncology

## 2020-04-17 ENCOUNTER — Other Ambulatory Visit: Payer: Self-pay

## 2020-04-17 ENCOUNTER — Inpatient Hospital Stay: Payer: PPO | Attending: Gynecologic Oncology | Admitting: Gynecologic Oncology

## 2020-04-17 ENCOUNTER — Encounter: Payer: Self-pay | Admitting: Gynecologic Oncology

## 2020-04-17 VITALS — BP 122/69 | HR 91 | Temp 97.8°F | Resp 16 | Ht 64.0 in | Wt 146.4 lb

## 2020-04-17 DIAGNOSIS — C800 Disseminated malignant neoplasm, unspecified: Secondary | ICD-10-CM

## 2020-04-17 DIAGNOSIS — R6881 Early satiety: Secondary | ICD-10-CM | POA: Insufficient documentation

## 2020-04-17 DIAGNOSIS — N9489 Other specified conditions associated with female genital organs and menstrual cycle: Secondary | ICD-10-CM

## 2020-04-17 DIAGNOSIS — R140 Abdominal distension (gaseous): Secondary | ICD-10-CM | POA: Insufficient documentation

## 2020-04-17 DIAGNOSIS — R971 Elevated cancer antigen 125 [CA 125]: Secondary | ICD-10-CM | POA: Diagnosis not present

## 2020-04-17 DIAGNOSIS — C801 Malignant (primary) neoplasm, unspecified: Secondary | ICD-10-CM

## 2020-04-17 DIAGNOSIS — C786 Secondary malignant neoplasm of retroperitoneum and peritoneum: Secondary | ICD-10-CM | POA: Insufficient documentation

## 2020-04-17 DIAGNOSIS — R600 Localized edema: Secondary | ICD-10-CM | POA: Diagnosis not present

## 2020-04-17 DIAGNOSIS — Z853 Personal history of malignant neoplasm of breast: Secondary | ICD-10-CM | POA: Diagnosis not present

## 2020-04-17 DIAGNOSIS — R32 Unspecified urinary incontinence: Secondary | ICD-10-CM | POA: Insufficient documentation

## 2020-04-17 DIAGNOSIS — R188 Other ascites: Secondary | ICD-10-CM | POA: Diagnosis not present

## 2020-04-17 LAB — CEA (IN HOUSE-CHCC): CEA (CHCC-In House): <1 ng/mL (ref 0.00–5.00)

## 2020-04-17 NOTE — Patient Instructions (Addendum)
It was a pleasure meeting you today.  If you have any questions or need anything, please do not hesitate to call at [phone].  We will plan to have tumor markers drawn today (CA 125, CEA). You will also be scheduled for a CT scan of your chest, abdomen, and pelvis.  An appointment has been arranged for you to meet with Dr. Heath Lark, Medical Oncologist.    You will receive a phone call from the hospital about arranging for your biopsy.

## 2020-04-17 NOTE — Progress Notes (Signed)
Met with Kayla Baker after her visit with Dr. Berline Lopes. Explained my role as gyn onc nurse navigator and went over upcoming appointments/treatment plan.  Also gave her the [employer] and encouraged her to call with any questions or needs.

## 2020-04-17 NOTE — Progress Notes (Signed)
GYNECOLOGIC ONCOLOGY NEW PATIENT CONSULTATION   Patient Name: Kayla Baker  Patient Age: 84 y.o. Date of Service: 5/18 Referring Provider: [name], MD [address] [place],  [place] [address]   Primary Care Provider: [name], MD Consulting Provider: Jeral Pinch, MD   Assessment/Plan:  Presumed stage III ovarian cancer.  I discussed that I believe she likely has stage IIIC ovarian cancer. I discussed that the treatment approach for this disease is typically combination of cytoreductive surgery and chemotherapy. I discussed that sequencing of this can be either with upfront debulking followed by adjuvant chemotherapy sequentially or neoadjuvant chemotherapy followed by an interval cytoreductive attempt, then additional chemotherapy. This latter approach is associated with a reduced perioperative morbidity at the time of surgery. I discussed that the goal of optimal sequencing is to optimise the likelihood that cytoreductive effect can be optimal to less than 1 cm of residual disease, and would not induce morbidity for the patient that would result in a delay of adjuvant chemotherapy. I discussed that it is an individual decision process that takes into account individual patient health, and preference factors, in addition to the apparent tumor distribution on imaging. I discussed that the overall survival observed in patients is equivalent for both approaches provided that there is an optimal cytoreductive effort at the time of surgery (regardless of the timing of that surgery).  We reviewed her imaging, which consists only of pelvic and imaging at this time.  I discussed the importance of full body imaging in the form of a CT of her chest, abdomen, and pelvis.  We will likely either administer IV fluids or reduced dose of contrast given her chronic kidney disease.  Given the burden of disease on her pelvic imaging, I am concerned that it would be difficult to  proceed with optimal debulking surgery.  I am thus recommending neoadjuvant chemotherapy followed by interval debulking surgery, hopefully after 3-4 cycles of chemotherapy.  The patient is amenable to this approach.  She understands that we will pursue a biopsy for tissue diagnosis.  I will put in an order both for a CT-guided biopsy as well as concurrent paracentesis for symptom management.  We will obtain tumor markers today.  The patient is scheduled to see Dr. [name] on Thursday.  We will ideally plan to start chemotherapy after pathology from her biopsy has resulted.  I will see her back after interval imaging in a couple of months.  A copy of this note was sent to the patient's referring provider.   55 minutes of total time was spent for this patient encounter, including preparation, face-to-face counseling with the patient and coordination of care, and documentation of the encounter.  Jeral Pinch, MD  Division of Gynecologic Oncology  Department of Obstetrics and Gynecology  [facility]  ___________________________________________  Chief Complaint: Chief Complaint  Patient presents with  . Adnexal mass    New Patient    History of Present Illness:  Kayla Baker is a 84 y.o. y.o. female who is seen in consultation at the request of Liane Comber, NP for an evaluation of new pelvic mass, ascites and carcinomatosis.  The patient reports 3 to 4 months of urinary incontinence, both during the day and at night.  She has had 3 larger episodes of incontinence at night when she is gotten up to go to the bathroom.  She is now wearing a pad all of the time.  She also endorses increased frequency.  About 6 weeks ago, she notes having decreased appetite, early satiety, and abdominal bloating.  She describes that food does not sound or look as good as it used to.  She is wearing more dresses because her pants fit tightly, especially at the end of the day, due to  increasing abdominal girth.  In January, she noted below the knee right lower extremity edema.  Due to her urinary frequency symptoms, she thought in February that she had a urinary tract infection.  She was started on Lasix without any change in her right lower extremity edema.  Urine studies at that time were negative for urine infection.  With time, the edema progressed above the knee.  The patient was seen in early May again for persistent lower extremity edema and urinary incontinence.  She was noted to have right progressive lower extremity edema was scheduled for Doppler study.  Doppler was negative for evidence of DVT.  Given her urinary incontinence and right lower extremity edema, transvaginal ultrasound was ordered to rule out any anatomic cause of her urinary symptoms.  Pelvic ultrasound showed left adnexal mass.  The patient endorses normal bowel function.  She denies any shortness of breath or chest pain with ambulation.  The patient's history is notable for estrogen receptor positive breast cancer, diagnosed initially in early 2017, followed by [name] here at the cancer center.  Patient is currently on anastrozole therapy.  Had negative genetic testing at the time of diagnosis.  Complete oncologic history as noted below.  She also has a history of type 2 diabetes, with the most recent hemoglobin A1c of 6.3% in November 2020.  S/p COVID vaccines, second on 1/30.  Treatment History: Oncology History  Breast cancer of upper-inner quadrant of left female breast ([place])  02/26/2016 Initial Diagnosis   Left breast biopsy 11:00 position: invasive ductal carcinoma with DCIS, ER 90%, PR 10%, HER-2 negative, Ki-67 30%, grade 2, 2.2 cm palpable lesion T2 N0 stage II a clinical stage   03/18/2016 Surgery   Left lumpectomy: Invasive ductal carcinoma, grade 2, 6.3 cm, with high-grade DCIS, margins negative, 0/4 lymph nodes negative, ER 90%, via 10%, HER-2 negative ratio 0.97, Ki-67 30%, T3 N0  stage IIB   03/25/2016 Procedure   Genetic testing is negative for pathogenic mutations within any of the 20 Genes on the breast/ovarian cancer panel   04/04/2016 Oncotype testing   Oncotype DX recurrence score 37, 25% 10 year distant risk of recurrence   04/17/2016 - 07/31/2016 Chemotherapy   Adjuvant chemotherapy with dose dense Adriamycin and Cytoxan followed by Abraxane weekly 8 ( discontinued due to neuropathy)   09/01/2016 - 09/26/2016 Radiation Therapy   Adj XRT 1) Left breast: 42.5 Gy in 17 fractions. 2) Left breast boost: 7.5 Gy in 3 fractions.   12/02/2016 -  Anti-estrogen oral therapy   Anastrozole 1 mg daily     PAST MEDICAL HISTORY:  Past Medical History:  Diagnosis Date  . Allergy   . Anemia   . Arthritis   . Blood transfusion without reported diagnosis   . Breast cancer of upper-inner quadrant of left female breast ([place]) 02/29/2016   skin- 2016- squamous- on right upper arm  . Diet-controlled type 2 diabetes mellitus ([place])    diet and exercsise,Has never been on meds, type II   . GERD (gastroesophageal reflux disease)   . Gout    takes Allopurinol daily  . History of chemotherapy   . History of colon polyps    benign  .  History of shingles   . Hx of radiation therapy   . Hyperlipidemia   . Hypertension   . Personal history of chemotherapy    2017  . Personal history of radiation therapy    2017  . Vitamin D deficiency      PAST SURGICAL HISTORY:  Past Surgical History:  Procedure Laterality Date  . ABDOMINAL HYSTERECTOMY  1973  . BREAST BIOPSY Left 02/26/2016  . BREAST LUMPECTOMY Left 03/18/2016  . CARDIAC CATHETERIZATION     patient denies this procedure  . CATARACT EXTRACTION, BILATERAL  2014   right eye 2/14; left eye 3/3  . COLONOSCOPY    . HAND SURGERY Left 2012  . INCONTINENCE SURGERY  2006  . KNEE ARTHROSCOPY Right   . MASTOPEXY Right 06/16/2017   Procedure: RIGHT BREAST MASTOPEXY;  Surgeon: Irene Limbo, MD;  Location: [facility];  Service: Plastics;  Laterality: Right;  . PORT-A-CATH REMOVAL N/A 11/27/2016   Procedure: REMOVAL PORT-A-CATH;  Surgeon: Fanny Skates, MD;  Location: [facility];  Service: General;  Laterality: N/A;  . PORTACATH PLACEMENT N/A 04/14/2016   Procedure: INSERTION PORT-A-CATH ;  Surgeon: Fanny Skates, MD;  Location: [facility];  Service: General;  Laterality: N/A;  . RADIOACTIVE SEED GUIDED PARTIAL MASTECTOMY WITH AXILLARY SENTINEL LYMPH NODE BIOPSY Left 03/18/2016   Procedure: RADIOACTIVE SEED GUIDED LEFT PARTIAL MASTECTOMY WITH AXILLARY SENTINEL LYMPH NODE BIOPSY AND BLUE DYE INJECTION;  Surgeon: Fanny Skates, MD;  Location: [facility];  Service: General;  Laterality: Left;  . REDUCTION MAMMAPLASTY Right 2018    OB/GYN HISTORY:  OB History  Gravida Para Term Preterm AB Living  0 0 0 0 0 0  SAB TAB Ectopic Multiple Live Births  0 0 0 0 0    No LMP recorded. Patient has had a hysterectomy.  Age at menarche: 89  Age at menopause: 89 at the time of her hysterectomy Hx of HRT: yes Hx of STDs: denies Last pap: unsure History of abnormal pap smears: no  SCREENING STUDIES:  Last mammogram: 03/2020  Last colonoscopy: 05/2018 Last bone mineral density: 05/2019  MEDICATIONS: Outpatient Encounter Medications as of 04/17/2020  Medication Sig  . acetaminophen (TYLENOL) 325 MG tablet Take 325 mg by mouth every 6 (six) hours as needed for headache.  . allopurinol (ZYLOPRIM) 300 MG tablet Take 1 tablet Daily to Prevent Gout  . anastrozole (ARIMIDEX) 1 MG tablet TAKE 1 TABLET(1 MG) BY MOUTH DAILY  . aspirin 81 MG tablet Take 81 mg by mouth daily.  . Cholecalciferol (VITAMIN D3) 5000 units CAPS Take 5,000 Units by mouth daily.  Marland Kitchen ezetimibe (ZETIA) 10 MG tablet Take 1 tablet Daily for Cholesterol  . furosemide (LASIX) 40 MG tablet Take  tablet Daily as needed for leg swelling and blood pressure  . gabapentin (NEURONTIN) 300 MG capsule Take 1 capsule 3 to 4  x /day as needed for pain  . glucose  blood (FREESTYLE TEST STRIPS) test strip Check Blood Sugar Daily as directed (Dx: E11.29)  . Lancets (FREESTYLE) lancets CHECK BLOOD SUGAR DAILY AS DIRECTED  . loratadine (CLARITIN) 10 MG tablet Take 10 mg by mouth daily as needed for allergies.  . Magnesium ([employer] MAGNESIUM) 250 MG TABS Take 500 mg by mouth 2 (two) times daily.  . metFORMIN (GLUCOPHAGE-XR) 500 MG 24 hr tablet Take 2 tablet 2 times a day for blood sugar.  . metoprolol succinate (TOPROL-XL) 25 MG 24 hr tablet Take 1 tablet Daily for BP  . mirabegron ER (  MYRBETRIQ) 50 MG TB24 tablet Take 1 tablet (50 mg total) by mouth daily.  . potassium chloride (KLOR-CON) 10 MEQ tablet TAKE 2 TABLET WITH FOOD WHEN TAKING FUROSEMIDE  . rosuvastatin (CRESTOR) 5 MG tablet Take 1 tablet 3 x /week on - Mon Wed Fri - for Cholesterol  . vitamin C (ASCORBIC ACID) 500 MG tablet Take 500 mg by mouth daily. Takes 2 tablets daily   No facility-administered encounter medications on file as of 04/17/2020.    ALLERGIES:  Allergies  Allergen Reactions  . Keflex [Cephalexin] Swelling    Tongue and throat swells  . Meloxicam Swelling  . Prednisone Swelling    Can Not take High doses  . Premarin [Estrogens Conjugated] Hives  . Trovan [Alatrofloxacin] Other (See Comments)    Unknown reaction     FAMILY HISTORY:  Family History  Problem Relation Age of Onset  . Stroke Mother 35  . Other Mother        history of hysterectomy after last childbirth  . Diabetes Father   . Alzheimer's disease Father   . Hypertension Sister   . Lung cancer Sister 84       smoker  . Other Sister        history of hysterectomy for fibroids  . Breast cancer Sister 36  . Esophageal cancer Maternal Aunt 74       not a smoker  . Heart attack Maternal Uncle 65  . Cirrhosis Sister   . Lung cancer Other        nephew dx. 86s; +smoker  . Epilepsy Other   . Other Other 12       great niece dx. benign ganglioglioma brain tumor; treated at [facility]  . Epilepsy Other        no  seizures in 2 years  . Parkinson's disease Maternal Aunt   . Stroke Maternal Grandmother   . Heart Problems Maternal Grandmother   . Pancreatic cancer Cousin 62       paternal 1st cousin  . Colon cancer Neg Hx   . Stomach cancer Neg Hx   . Colon polyps Neg Hx   . Ulcerative colitis Neg Hx      SOCIAL HISTORY:    Social Connections:   . Frequency of Communication with Friends and Family:   . Frequency of Social Gatherings with Friends and Family:   . Attends Religious Services:   . Active Member of Clubs or Organizations:   . Attends Archivist Meetings:   Marland Kitchen Marital Status:     REVIEW OF SYSTEMS:  Positive for decreased appetite, right leg swelling, urinary frequency, early satiety, incontinence, joint pain. Denies fevers, chills, fatigue, unexplained weight changes. Denies hearing loss, neck lumps or masses, mouth sores, ringing in ears or voice changes. Denies cough or wheezing.  Denies shortness of breath. Denies chest pain or palpitations. Denies leg swelling. Denies abdominal pain, blood in stools, constipation, diarrhea, nausea, vomiting. Denies pain with intercourse, dysuria, hematuria. Denies hot flashes, pelvic pain, vaginal bleeding or vaginal discharge.   Denies back pain or muscle pain/cramps. Denies itching, rash, or wounds. Denies dizziness, headaches, numbness or seizures. Denies swollen lymph nodes or glands, denies easy bruising or bleeding. Denies anxiety, depression, confusion, or decreased concentration.  Physical Exam:  Vital Signs for this encounter:  Blood pressure 122/69, pulse 91, temperature 97.8 F (36.6 C), temperature source Temporal, resp. rate 16, height 5' 4"  (1.626 m), weight 146 lb 6 oz (66.4 kg), SpO2 100 %. Body  mass index is 25.13 kg/m. General: Alert, oriented, no acute distress.  HEENT: Normocephalic, atraumatic. Sclera anicteric.  Chest: Clear to auscultation bilaterally. No wheezes, rhonchi, or rales. Cardiovascular:  Regular rate and rhythm, no murmurs, rubs, or gallops.  Abdomen: Normoactive bowel sounds. Soft, mildly distended in the mid and lower abdomen, nontender to palpation. No hepatosplenomegaly appreciated. + palpable fluid wave.  Fullness appreciated in the lower abdomen. Extremities: Grossly normal range of motion. Warm, well perfused. 1-2+ edema of right LE from mid-thigh.  Skin: No rashes or lesions.  Lymphatics: No cervical, supraclavicular, or inguinal adenopathy.  GU:  Normal external female genitalia.  No lesions. No discharge or bleeding.             Bladder/urethra:  No lesions or masses, well supported bladder             Vagina: Moderately atrophic vaginal mucosa.             Cervix: Surgically absent.             Uterus: Surgically absent.             Adnexa: Mass fills the cul-de-sac with some nodularity appreciated, mass feels approximately 12-14 cm and fullness can be appreciated almost up to the level of the umbilicus, minimal mobility of this mass.  Rectal: Some nodularity of the mass appreciated, rectum feels free of the mass itself.  LABORATORY AND RADIOLOGIC DATA:  Outside medical records were reviewed to synthesize the above history, along with the history and physical obtained during the visit.   Lab Results  Component Value Date   WBC 7.3 04/03/2020   HGB 11.3 (L) 04/03/2020   HCT 36.1 04/03/2020   PLT 236 04/03/2020   GLUCOSE 83 04/11/2020   CHOL 143 10/25/2019   TRIG 138 10/25/2019   HDL 41 (L) 10/25/2019   LDLCALC 78 10/25/2019   ALT 10 04/11/2020   AST 25 04/11/2020   NA 142 04/11/2020   K 4.7 04/11/2020   CL 106 04/11/2020   CREATININE 1.35 (H) 04/11/2020   BUN 34 (H) 04/11/2020   CO2 27 04/11/2020   TSH 3.04 10/25/2019   HGBA1C 6.3 (H) 10/25/2019   MICROALBUR 0.2 04/18/2019   Pelvic US on 5/4: Small to moderate free pelvic fluid. Complex cystic mass identified within the LEFT pelvis, 12.5 x 11.0 x 8.7 cm with a calculated volume of 625 mL. Diffuse  low level internal echoes are present within the cystic component. Abnormal mural soft tissue density. No definite normal appearing ovarian tissue is definitely. Finding is concerning for a cystic ovarian neoplasm and correlation with serum tumors markers and further assessment by MR is recommended.  IMPRESSION: Complex cystic and solid mass in LEFT adnexa 12.5 cm diameter question cystic ovarian neoplasm; recommend correlation with serum tumor markers and further evaluation by MR imaging with and without Contrast.   MRI pelvic on 5/16: IMPRESSION: 1. Large complex solid and cystic mass arising from the right adnexa measuring 10.8 by 11.5 by 11.1 cm. This has an aggressive appearance with extensive enhancing mural soft tissue components. Findings are highly suspicious for malignant ovarian neoplasm. 2. Extensive bilateral retroperitoneal and bilateral iliac adenopathy compatible with metastatic disease. 3. Signs of extensive peritoneal carcinomatosis including ascites, enhancement, thickening and nodularity of the peritoneal reflections, omental caking and bulky peritoneal nodularity. 4. Suspected serosal involvement of the dome of bladder with loss of normal fat plane. Cannot rule out mural invasion by tumor.

## 2020-04-18 ENCOUNTER — Encounter: Payer: PPO | Admitting: Internal Medicine

## 2020-04-18 ENCOUNTER — Other Ambulatory Visit: Payer: Self-pay | Admitting: Adult Health

## 2020-04-18 LAB — CA 125: Cancer Antigen (CA) 125: 1762 U/mL — ABNORMAL HIGH (ref 0.0–38.1)

## 2020-04-19 ENCOUNTER — Inpatient Hospital Stay: Payer: PPO | Admitting: Hematology and Oncology

## 2020-04-19 ENCOUNTER — Other Ambulatory Visit: Payer: Self-pay

## 2020-04-19 ENCOUNTER — Encounter: Payer: Self-pay | Admitting: Hematology and Oncology

## 2020-04-19 ENCOUNTER — Telehealth: Payer: Self-pay | Admitting: Oncology

## 2020-04-19 ENCOUNTER — Encounter (HOSPITAL_COMMUNITY): Payer: Self-pay

## 2020-04-19 VITALS — BP 129/66 | HR 93 | Temp 97.9°F | Resp 18 | Ht 64.0 in | Wt 147.4 lb

## 2020-04-19 DIAGNOSIS — Z170 Estrogen receptor positive status [ER+]: Secondary | ICD-10-CM | POA: Diagnosis not present

## 2020-04-19 DIAGNOSIS — N183 Chronic kidney disease, stage 3 unspecified: Secondary | ICD-10-CM

## 2020-04-19 DIAGNOSIS — R600 Localized edema: Secondary | ICD-10-CM

## 2020-04-19 DIAGNOSIS — E1140 Type 2 diabetes mellitus with diabetic neuropathy, unspecified: Secondary | ICD-10-CM | POA: Diagnosis not present

## 2020-04-19 DIAGNOSIS — C569 Malignant neoplasm of unspecified ovary: Secondary | ICD-10-CM | POA: Diagnosis not present

## 2020-04-19 DIAGNOSIS — E1122 Type 2 diabetes mellitus with diabetic chronic kidney disease: Secondary | ICD-10-CM | POA: Diagnosis not present

## 2020-04-19 DIAGNOSIS — C50212 Malignant neoplasm of upper-inner quadrant of left female breast: Secondary | ICD-10-CM

## 2020-04-19 DIAGNOSIS — Z7189 Other specified counseling: Secondary | ICD-10-CM | POA: Diagnosis not present

## 2020-04-19 DIAGNOSIS — C786 Secondary malignant neoplasm of retroperitoneum and peritoneum: Secondary | ICD-10-CM | POA: Diagnosis not present

## 2020-04-19 DIAGNOSIS — C800 Disseminated malignant neoplasm, unspecified: Secondary | ICD-10-CM | POA: Diagnosis not present

## 2020-04-19 MED ORDER — LIDOCAINE-PRILOCAINE 2.5-2.5 % EX CREA: TOPICAL_CREAM | CUTANEOUS | 3 refills | Status: DC

## 2020-04-19 MED ORDER — ONDANSETRON HCL 8 MG PO TABS: 8.0000 mg | ORAL_TABLET | Freq: Two times a day (BID) | ORAL | 1 refills | Status: DC | PRN

## 2020-04-19 MED ORDER — PROCHLORPERAZINE MALEATE 10 MG PO TABS: 10.0000 mg | ORAL_TABLET | Freq: Four times a day (QID) | ORAL | 1 refills | Status: DC | PRN

## 2020-04-19 MED ORDER — DEXAMETHASONE 4 MG PO TABS: ORAL_TABLET | ORAL | 5 refills | Status: DC

## 2020-04-19 NOTE — Progress Notes (Unsigned)
Kayla Baker Female, 84 y.o., 30-Jan-1936 MRN:  [id number] Phone:  [phone] (H) PCP:  [name], MD Coverage:  [employer] Ppo Next Appt With Radiology ([facility] 1) 04/23/2020 at 8:00 AM  RE: Biopsy Received: Yesterday Message Contents  Corrie Mckusick, DO  [name]      OK for CT guided biopsy, either lymph node or mesenteric mass.    [name]   Previous Messages  ----- Message -----  From: [name]  Sent: 04/18/2020 11:42 AM EDT  To: Ir Procedure Requests  Subject: Biopsy                      Procedure Requested: CT Biopsy    Reason for Procedure: carcinomatosis, omental caking supsicious for GYN malignancy    Provider Requesting: Lafonda Mosses  Provider Telephone: [phone]    Other Info: Rad exam in Epic , Future: CT 04/23/2020

## 2020-04-19 NOTE — Progress Notes (Signed)
START ON PATHWAY REGIMEN - Ovarian     A cycle is every 21 days:     Paclitaxel      Carboplatin   **Always confirm dose/schedule in your pharmacy ordering system**  Patient Characteristics: Preoperative or Nonsurgical Candidate (Clinical Staging), Newly Diagnosed, Neoadjuvant Therapy followed by Surgery Therapeutic Status: Preoperative or Nonsurgical Candidate (Clinical Staging) BRCA Mutation Status: Awaiting Test Results AJCC T Category: cT3 AJCC 8 Stage Grouping: Unknown AJCC N Category: cN1 AJCC M Category: cM0 Therapy Plan: Neoadjuvant Therapy followed by Surgery Intent of Therapy: Curative Intent, Discussed with Patient

## 2020-04-19 NOTE — Telephone Encounter (Signed)
Called Kayla Baker with port placement appointment on 04/24/20 (arrive at 7 am at [facility] admitting, NPO p midnight, need a driver).  She verbalized understanding and agreement of appointment and instructions.

## 2020-04-20 ENCOUNTER — Telehealth: Payer: Self-pay | Admitting: Hematology and Oncology

## 2020-04-20 ENCOUNTER — Encounter: Payer: Self-pay | Admitting: Hematology and Oncology

## 2020-04-20 DIAGNOSIS — Z7189 Other specified counseling: Secondary | ICD-10-CM | POA: Insufficient documentation

## 2020-04-20 NOTE — Progress Notes (Signed)
[facility] progress notes  Patient Care Team: [name], MD as PCP - Kinnie Scales, OD as Referring Physician (Optometry) [name], MD as Consulting Physician (Dermatology) Latanya Maudlin, MD as Consulting Physician (Orthopedic Surgery) Inda [place], MD (Inactive) as Consulting Physician (Gastroenterology) [name], MD as Consulting Physician (Hematology and Oncology) Jacqulyn Liner, RN as Oncology Nurse Navigator (Oncology)  CHIEF COMPLAINTS/PURPOSE OF VISIT:  New pelvic mass, suspect ovarian cancer, for chemotherapy  HISTORY OF PRESENTING ILLNESS:  Kayla Baker 84 y.o. female was transferred to my care due to new diagnosis of probably ovarian cancer, for further management The patient have prior history of breast cancer with negative genetic testing She is here today accompanied by her husband, [name] She is retired She has not been feeling well for several months with changes in abdominal girth/swelling and urinary incontinence for several months  She has also noticed right lower extremity edema and underwent several imaging studies for evaluation which excluded DVT Subsequently, after multiple imaging studies, overall presentation is highly suspicious for ovarian cancer She is recommended to undergo neoadjuvant chemotherapy She also complained of some mild anorexia and loss of appetite but overall, no documented weight loss From her history of breast cancer, she has developed grade 2 neuropathy affecting her feet, causing numbness and tightness.  She also have diabetes that might have contributed to some component of neuropathy Her diabetes is well controlled  I reviewed the patient's records extensive and collaborated the history with the patient. Summary of her history is as follows: Oncology History  Breast cancer of upper-inner quadrant of left female breast ([place])  02/26/2016 Initial Diagnosis   Left breast biopsy 11:00 position:  invasive ductal carcinoma with DCIS, ER 90%, PR 10%, HER-2 negative, Ki-67 30%, grade 2, 2.2 cm palpable lesion T2 N0 stage II a clinical stage   03/18/2016 Surgery   Left lumpectomy: Invasive ductal carcinoma, grade 2, 6.3 cm, with high-grade DCIS, margins negative, 0/4 lymph nodes negative, ER 90%, via 10%, HER-2 negative ratio 0.97, Ki-67 30%, T3 N0 stage IIB   03/25/2016 Procedure   Genetic testing is negative for pathogenic mutations within any of the 20 Genes on the breast/ovarian cancer panel   04/04/2016 Oncotype testing   Oncotype DX recurrence score 37, 25% 10 year distant risk of recurrence   04/17/2016 - 07/31/2016 Chemotherapy   Adjuvant chemotherapy with dose dense Adriamycin and Cytoxan followed by Abraxane weekly 8 ( discontinued due to neuropathy)   09/01/2016 - 09/26/2016 Radiation Therapy   Adj XRT 1) Left breast: 42.5 Gy in 17 fractions. 2) Left breast boost: 7.5 Gy in 3 fractions.   12/02/2016 -  Anti-estrogen oral therapy   Anastrozole 1 mg daily   Ovarian cancer ([place])  04/03/2020 Imaging   US pelvis Complex cystic and solid mass in LEFT adnexa 12.5 cm diameter question cystic ovarian neoplasm; recommend correlation with serum tumor markers and further evaluation by MR imaging with and without contrast.     04/04/2020 Imaging   US venous Doppler No evidence of deep venous thrombosis in the right lower extremity. Left common femoral vein also patent.   04/15/2020 Imaging   MRI pelvis 1. Large complex solid and cystic mass arising from the right adnexa measuring 10.8 by 11.5 by 11.1 cm. This has an aggressive appearance with extensive enhancing mural soft tissue components. Findings are highly suspicious for malignant ovarian neoplasm. 2. Extensive bilateral retroperitoneal and bilateral iliac adenopathy compatible with metastatic disease. 3. Signs  of extensive peritoneal carcinomatosis including ascites, enhancement, thickening and nodularity of the peritoneal reflections,  omental caking and bulky peritoneal nodularity. 4. Suspected serosal involvement of the dome of bladder with loss of normal fat plane. Cannot rule out mural invasion by tumor.   04/17/2020 Tumor Marker   Patient's tumor was tested for the following markers: CA-125 Results of the tumor marker test revealed 1762.   04/20/2020 Cancer Staging   Staging form: Ovary, Fallopian Tube, and Primary Peritoneal Carcinoma, AJCC 8th Edition - Clinical: FIGO Stage IIIC (cT3, cN1, cM0) - Signed by Heath Lark, MD on 04/20/2020     MEDICAL HISTORY:  Past Medical History:  Diagnosis Date  . Allergy   . Anemia   . Arthritis   . Blood transfusion without reported diagnosis   . Breast cancer of upper-inner quadrant of left female breast ([place]) 02/29/2016   skin- 2016- squamous- on right upper arm  . Diet-controlled type 2 diabetes mellitus ([place])    diet and exercsise,Has never been on meds, type II   . GERD (gastroesophageal reflux disease)   . Gout    takes Allopurinol daily  . History of chemotherapy   . History of colon polyps    benign  . History of shingles   . Hx of radiation therapy   . Hyperlipidemia   . Hypertension   . Personal history of chemotherapy    2017  . Personal history of radiation therapy    2017  . Vitamin D deficiency     SURGICAL HISTORY: Past Surgical History:  Procedure Laterality Date  . ABDOMINAL HYSTERECTOMY  1973  . BREAST BIOPSY Left 02/26/2016  . BREAST LUMPECTOMY Left 03/18/2016  . CARDIAC CATHETERIZATION     patient denies this procedure  . CATARACT EXTRACTION, BILATERAL  2014   right eye 2/14; left eye 3/3  . COLONOSCOPY    . HAND SURGERY Left 2012  . INCONTINENCE SURGERY  2006  . KNEE ARTHROSCOPY Right   . MASTOPEXY Right 06/16/2017   Procedure: RIGHT BREAST MASTOPEXY;  Surgeon: Irene Limbo, MD;  Location: [facility];  Service: Plastics;  Laterality: Right;  . PORT-A-CATH REMOVAL N/A 11/27/2016   Procedure: REMOVAL PORT-A-CATH;   Surgeon: Fanny Skates, MD;  Location: [facility];  Service: General;  Laterality: N/A;  . PORTACATH PLACEMENT N/A 04/14/2016   Procedure: INSERTION PORT-A-CATH ;  Surgeon: Fanny Skates, MD;  Location: [facility];  Service: General;  Laterality: N/A;  . RADIOACTIVE SEED GUIDED PARTIAL MASTECTOMY WITH AXILLARY SENTINEL LYMPH NODE BIOPSY Left 03/18/2016   Procedure: RADIOACTIVE SEED GUIDED LEFT PARTIAL MASTECTOMY WITH AXILLARY SENTINEL LYMPH NODE BIOPSY AND BLUE DYE INJECTION;  Surgeon: Fanny Skates, MD;  Location: [facility];  Service: General;  Laterality: Left;  . REDUCTION MAMMAPLASTY Right 2018    SOCIAL HISTORY: Social History   Socioeconomic History  . Marital status: Married    Spouse name: [name]  . Number of children: 0  . Years of education: Not on file  . Highest education level: Not on file  Occupational History  . Not on file  Tobacco Use  . Smoking status: Never Smoker  . Smokeless tobacco: Never Used  Substance and Sexual Activity  . Alcohol use: No  . Drug use: No  . Sexual activity: Not on file  Other Topics Concern  . Not on file  Social History Narrative  . Not on file   Social Determinants of Health   Financial Resource Strain:   . Difficulty of Paying Living  Expenses:   Food Insecurity:   . Worried About Charity fundraiser in the Last Year:   . Arboriculturist in the Last Year:   Transportation Needs:   . Film/video editor (Medical):   Marland Kitchen Lack of Transportation (Non-Medical):   Physical Activity:   . Days of Exercise per Week:   . Minutes of Exercise per Session:   Stress:   . Feeling of Stress :   Social Connections:   . Frequency of Communication with Friends and Family:   . Frequency of Social Gatherings with Friends and Family:   . Attends Religious Services:   . Active Member of Clubs or Organizations:   . Attends Archivist Meetings:   Marland Kitchen Marital Status:   Intimate Partner Violence:   . Fear of Current or Ex-Partner:   . Emotionally  Abused:   Marland Kitchen Physically Abused:   . Sexually Abused:     FAMILY HISTORY: Family History  Problem Relation Age of Onset  . Stroke Mother 51  . Other Mother        history of hysterectomy after last childbirth  . Diabetes Father   . Alzheimer's disease Father   . Hypertension Sister   . Lung cancer Sister 19       smoker  . Other Sister        history of hysterectomy for fibroids  . Breast cancer Sister 81  . Esophageal cancer Maternal Aunt 74       not a smoker  . Heart attack Maternal Uncle 65  . Cirrhosis Sister   . Lung cancer Other        nephew dx. 47s; +smoker  . Epilepsy Other   . Other Other 12       great niece dx. benign ganglioglioma brain tumor; treated at [facility]  . Epilepsy Other        no seizures in 2 years  . Parkinson's disease Maternal Aunt   . Stroke Maternal Grandmother   . Heart Problems Maternal Grandmother   . Pancreatic cancer Cousin 76       paternal 1st cousin  . Colon cancer Neg Hx   . Stomach cancer Neg Hx   . Colon polyps Neg Hx   . Ulcerative colitis Neg Hx     ALLERGIES:  is allergic to keflex [cephalexin]; meloxicam; premarin [estrogens conjugated]; and trovan [alatrofloxacin].  MEDICATIONS:  Current Outpatient Medications  Medication Sig Dispense Refill  . acetaminophen (TYLENOL) 325 MG tablet Take 325 mg by mouth every 6 (six) hours as needed for headache.    . allopurinol (ZYLOPRIM) 300 MG tablet Take 1 tablet Daily to Prevent Gout 90 tablet 3  . aspirin 81 MG tablet Take 81 mg by mouth daily.    . Cholecalciferol (VITAMIN D3) 5000 units CAPS Take 5,000 Units by mouth daily.    Marland Kitchen dexamethasone (DECADRON) 4 MG tablet Take 2 tabs at the night before and 2 tab the morning of chemotherapy, every 3 weeks, by mouth 36 tablet 5  . ezetimibe (ZETIA) 10 MG tablet Take 1 tablet Daily for Cholesterol 90 tablet 3  . gabapentin (NEURONTIN) 300 MG capsule Take 1 capsule 3 to 4  x /day as needed for pain 360 capsule 3  . glucose blood (FREESTYLE  TEST STRIPS) test strip Check Blood Sugar Daily as directed (Dx: E11.29) 100 strip 3  . Lancets (FREESTYLE) lancets CHECK BLOOD SUGAR DAILY AS DIRECTED 100 each 3  . lidocaine-prilocaine (EMLA) cream  Apply to affected area once 30 g 3  . loratadine (CLARITIN) 10 MG tablet Take 10 mg by mouth daily as needed for allergies.    . Magnesium ([employer] MAGNESIUM) 250 MG TABS Take 500 mg by mouth 2 (two) times daily.    . metFORMIN (GLUCOPHAGE-XR) 500 MG 24 hr tablet Take 2 tablet 2 times a day for blood sugar. 180 tablet 1  . metoprolol succinate (TOPROL-XL) 25 MG 24 hr tablet Take 1 tablet Daily for BP 90 tablet 3  . mirabegron ER (MYRBETRIQ) 50 MG TB24 tablet Take 1 tablet (50 mg total) by mouth daily. 30 tablet 2  . ondansetron (ZOFRAN) 8 MG tablet Take 1 tablet (8 mg total) by mouth 2 (two) times daily as needed for refractory nausea / vomiting. Start on day 3 after carboplatin chemo. 30 tablet 1  . prochlorperazine (COMPAZINE) 10 MG tablet Take 1 tablet (10 mg total) by mouth every 6 (six) hours as needed (Nausea or vomiting). 30 tablet 1  . rosuvastatin (CRESTOR) 5 MG tablet Take 1 tablet 3 x /week on - Mon Wed Fri - for Cholesterol 90 tablet 0  . vitamin C (ASCORBIC ACID) 500 MG tablet Take 500 mg by mouth daily. Takes 2 tablets daily     No current facility-administered medications for this visit.    REVIEW OF SYSTEMS:   Constitutional: Denies fevers, chills or abnormal night sweats Eyes: Denies blurriness of vision, double vision or watery eyes Ears, nose, mouth, throat, and face: Denies mucositis or sore throat Respiratory: Denies cough, dyspnea or wheezes Cardiovascular: Denies palpitation, chest discomfort or lower extremity swelling Skin: Denies abnormal skin rashes Lymphatics: Denies new lymphadenopathy or easy bruising Behavioral/Psych: Mood is stable, no new changes  All other systems were reviewed with the patient and are negative.  PHYSICAL EXAMINATION: ECOG PERFORMANCE STATUS: 1 -  Symptomatic but completely ambulatory  Vitals:   04/19/20 1134  BP: 129/66  Pulse: 93  Resp: 18  Temp: 97.9 F (36.6 C)  SpO2: 100%   Filed Weights   04/19/20 1134  Weight: 147 lb 6.4 oz (66.9 kg)    GENERAL:alert, no distress and comfortable SKIN: skin color, texture, turgor are normal, no rashes or significant lesions EYES: normal, conjunctiva are pink and non-injected, sclera clear OROPHARYNX:no exudate, normal lips, buccal mucosa, and tongue  NECK: supple, thyroid normal size, non-tender, without nodularity LYMPH:  no palpable lymphadenopathy in the cervical, axillary or inguinal LUNGS: clear to auscultation and percussion with normal breathing effort HEART: regular rate & rhythm and no murmurs with right lower extremity edema ABDOMEN:abdomen soft, non-tender and normal bowel sounds. Distended with ascites. Large pelvic mass is palpable Musculoskeletal:no cyanosis of digits and no clubbing  PSYCH: alert & oriented x 3 with fluent speech NEURO: no focal motor/sensory deficits  LABORATORY DATA:  I have reviewed the data as listed Lab Results  Component Value Date   WBC 7.3 04/03/2020   HGB 11.3 (L) 04/03/2020   HCT 36.1 04/03/2020   MCV 91.6 04/03/2020   PLT 236 04/03/2020   Recent Labs    05/30/19 1032 07/25/19 0924 02/02/20 1214 04/03/20 1136 04/11/20 1027  NA  --    < > 144 141 142  K  --    < > 3.7 4.4 4.7  CL  --    < > 105 105 106  CO2  --    < > 28 28 27   GLUCOSE  --    < > 84 83 83  BUN  --    < >  26* 31* 34*  CREATININE  --    < > 1.15* 1.37* 1.35*  CALCIUM  --    < > 10.4 10.2 10.2  GFRNONAA  --    < > 45* 36* 36*  GFRAA  --    < > 52* 42* 42*  PROT 6.9   < > 7.3 6.8 6.8  AST 20   < > 26 27 25   ALT 11   < > 14 11 10   BILITOT 0.4   < > 0.4 0.4 0.4  BILIDIR 0.1  --   --   --   --   IBILI 0.3  --   --   --   --    < > = values in this interval not displayed.    RADIOGRAPHIC STUDIES: I have personally reviewed the radiological images as listed  and agreed with the findings in the report. MR Pelvis W Wo Contrast  Result Date: 04/15/2020 CLINICAL DATA:  Staging ovarian cancer. EXAM: MRI PELVIS WITHOUT AND WITH CONTRAST TECHNIQUE: Multiplanar multisequence MR imaging of the pelvis was performed both before and after administration of intravenous contrast. CONTRAST:  73m MULTIHANCE GADOBENATE DIMEGLUMINE 529 MG/ML IV SOLN COMPARISON:  Ultrasound dated 04/03/2020 FINDINGS: Urinary Tract: Large pelvic mass and bulky peritoneal disease has mass effect upon the dome of bladder. Loss of fat plane along the dome of bladder and peritoneal lesions noted. Cannot rule out serosal involvement of the bladder dome. Bowel: Imaged portions of the colon and rectum. No pathologically enlarged small or large bowel loops identified at this time to suggest a bowel obstruction. Vascular/Lymphatic: No significant vascular abnormality identified. Extensive bilateral common iliac and external iliac adenopathy is identified. Left common iliac lymph node measures 1.7 cm, image number 1/9 Right common iliac lymph node measures 1.3 cm, image 5/9. Right pelvic sidewall lymph node measures 1.7 cm, image 82/9. Left external iliac lymph node measures 1.2 cm, image 52/9 Bulky retroperitoneal adenopathy is identified within the abdomen as seen on the postcontrast coronal images. This includes a left retroperitoneal node with a short axis of 1.7 cm, image 69/11. Reproductive: Uterus: Surgically absent. Right ovary: Not seen. Large complex solid and cystic mass with enhancing soft tissue components is identified within pelvis in appears to arise from the right adnexa. This measures 10.8 by 11.5 by 11.1 cm, image 51/9 and image 22/3. This has an aggressive appearance with extensive enhancing mural soft tissue. Highly concerning for malignant ovarian neoplasm. Left ovary: Measures 1.5 x 3.1 x 3.6 cm. This contains 3 simple appearing unilocular cysts which measure up to 1.9 cm, image 77/9 and  image 15/3. Other:  Ascites is identified within the abdomen and pelvis. Bulky peritoneal disease with extensive caking of the omentum is identified compatible with peritoneal carcinomatosis. Omental tumor along the anterior midline of abdomen measures 3.5 x 2.2 cm, image 8/6. Tumor implant along the right pericolic gutter measures 3.4 x 3.9 cm, image 23/3. There is diffuse enhancement and nodularity involving the peritoneal reflections. Musculoskeletal: No suspicious enhancing bone lesions. IMPRESSION: 1. Large complex solid and cystic mass arising from the right adnexa measuring 10.8 by 11.5 by 11.1 cm. This has an aggressive appearance with extensive enhancing mural soft tissue components. Findings are highly suspicious for malignant ovarian neoplasm. 2. Extensive bilateral retroperitoneal and bilateral iliac adenopathy compatible with metastatic disease. 3. Signs of extensive peritoneal carcinomatosis including ascites, enhancement, thickening and nodularity of the peritoneal reflections, omental caking and bulky peritoneal nodularity. 4. Suspected serosal involvement of  the dome of bladder with loss of normal fat plane. Cannot rule out mural invasion by tumor. Electronically Signed   By: Kerby Moors M.D.   On: 04/15/2020 15:18   US Venous Img Lower Unilateral Right (DVT)  Result Date: 04/04/2020 CLINICAL DATA:  Right lower extremity edema EXAM: RIGHT LOWER EXTREMITY VENOUS DUPLEX ULTRASOUND TECHNIQUE: Gray-scale sonography with graded compression, as well as color Doppler and duplex ultrasound were performed to evaluate the right lower extremity deep venous system from the level of the common femoral vein and including the common femoral, femoral, profunda femoral, popliteal and calf veins including the posterior tibial, peroneal and gastrocnemius veins when visible. The superficial great saphenous vein was also interrogated. Spectral Doppler was utilized to evaluate flow at rest and with distal  augmentation maneuvers in the common femoral, femoral and popliteal veins. COMPARISON:  None. FINDINGS: Contralateral Common Femoral Vein: Respiratory phasicity is normal and symmetric with the symptomatic side. No evidence of thrombus. Normal compressibility. Common Femoral Vein: No evidence of thrombus. Normal compressibility, respiratory phasicity and response to augmentation. Saphenofemoral Junction: No evidence of thrombus. Normal compressibility and flow on color Doppler imaging. Profunda Femoral Vein: No evidence of thrombus. Normal compressibility and flow on color Doppler imaging. Femoral Vein: No evidence of thrombus. Normal compressibility, respiratory phasicity and response to augmentation. Popliteal Vein: No evidence of thrombus. Normal compressibility, respiratory phasicity and response to augmentation. Calf Veins: No evidence of thrombus. Normal compressibility and flow on color Doppler imaging. Superficial Great Saphenous Vein: No evidence of thrombus. Normal compressibility. Venous Reflux:  None. Other Findings:  None. IMPRESSION: No evidence of deep venous thrombosis in the right lower extremity. Left common femoral vein also patent. Electronically Signed   By: Lowella Grip III M.D.   On: 04/04/2020 10:02   US PELVIC COMPLETE WITH TRANSVAGINAL  Result Date: 04/03/2020 CLINICAL DATA:  Pelvic pressure and episodes of incontinence for 3 months, progressive, RIGHT leg edema, prior bladder sling, partial hysterectomy with ovarian sparing, breast cancer EXAM: TRANSABDOMINAL AND TRANSVAGINAL ULTRASOUND OF PELVIS TECHNIQUE: Both transabdominal and transvaginal ultrasound examinations of the pelvis were performed. Transabdominal technique was performed for global imaging of the pelvis including uterus, ovaries, adnexal regions, and pelvic cul-de-sac. It was necessary to proceed with endovaginal exam following the transabdominal exam to visualize the ovaries. COMPARISON:  None FINDINGS: Uterus  Surgically absent Endometrium N/A Right ovary Measurements: 3.3 x 1.6 x 2.1 cm = volume: 5.5 mL. Normal morphology without mass Left ovary No normal appearing LEFT ovary visualized see below Other findings Small to moderate free pelvic fluid. Complex cystic mass identified within the LEFT pelvis, 12.5 x 11.0 x 8.7 cm with a calculated volume of 625 mL. Diffuse low level internal echoes are present within the cystic component. Abnormal mural soft tissue density. No definite normal appearing ovarian tissue is definitely. Finding is concerning for a cystic ovarian neoplasm and correlation with serum tumors markers and further assessment by MR is recommended. IMPRESSION: Complex cystic and solid mass in LEFT adnexa 12.5 cm diameter question cystic ovarian neoplasm; recommend correlation with serum tumor markers and further evaluation by MR imaging with and without contrast. Electronically Signed   By: Lavonia Dana M.D.   On: 04/03/2020 14:56    ASSESSMENT & PLAN:  Ovarian cancer ([place]) Her overall presentation and results of imaging studies, along with elevated CA-125, highly suspicious for ovarian cancer causing abdominal carcinomatosis She had previous genetic testing done that was negative In any case, I do agree with the plan  to complete staging with CT scan of the chest along with biopsy.  We reviewed the NCCN guidelines We discussed the role of neoadjuvant chemotherapy. The intent is of curative intent.  We discussed some of the risks, benefits, side-effects of carboplatin & Taxol. Treatment is intravenous, every 3 weeks x 6 cycles She will get repeat imaging study performed after cycle 3 to assess for objective assessment of response to therapy If she have good clinical response, we will interrupt chemotherapy for interval debulking surgery She will get monthly tumor marker monitoring while on treatment  Some of the short term side-effects included, though not limited to, including weight loss,  life threatening infections, risk of allergic reactions, need for transfusions of blood products, nausea, vomiting, change in bowel habits, loss of hair, admission to hospital for various reasons, and risks of death.   Long term side-effects are also discussed including risks of infertility, permanent damage to nerve function, hearing loss, chronic fatigue, kidney damage with possibility needing hemodialysis, and rare secondary malignancy including bone marrow disorders.  The patient is aware that the response rates discussed earlier is not guaranteed.  After a long discussion, patient made an informed decision to proceed with the prescribed plan of care.   Patient education material was dispensed. We discussed premedication with dexamethasone before chemotherapy. I recommend port placement before treatment starts Due to pre-existing peripheral neuropathy, I plan upfront dose reduction of Taxol   Breast cancer of upper-inner quadrant of left female breast ([place]) I recommend she hold Arimidex while on chemotherapy This is discussed with her breast oncologist and he is in agreement  CKD stage 3 due to type 2 diabetes mellitus ([place]) I will adjust her carboplatin dose accordingly  Carcinomatosis ([place]) She has abdominal ascites on exam She is arrange for paracentesis next week.  Leg edema, right This is likely due to compression of venous drainage from her abdominal mass She had previous work-up which rule out DVT I do not recommend her to use a diuretic therapy Recommend leg elevation only  Diabetic neuropathy, painful ([facility]) I plan upfront dose reduction of Taxol due to pre-existing neuropathy  Goals of care, counseling/discussion The goal of treatment is curative intent   Orders Placed This Encounter  Procedures  . IR IMAGING GUIDED PORT INSERTION    Standing Status:   Future    Standing Expiration Date:   06/19/2021    Order Specific Question:   Reason for Exam (SYMPTOM  OR  DIAGNOSIS REQUIRED)    Answer:   need port for chemo to start on 6/3    Order Specific Question:   Preferred Imaging Location?    Answer:   [facility]  . CBC with Differential ([place] Only)    Standing Status:   Standing    Number of Occurrences:   20    Standing Expiration Date:   04/19/2021  . CMP ([place] only)    Standing Status:   Standing    Number of Occurrences:   20    Standing Expiration Date:   04/19/2021  . CA 125    Standing Status:   Standing    Number of Occurrences:   11    Standing Expiration Date:   04/19/2021    All questions were answered. The patient knows to call the clinic with any problems, questions or concerns. The total time spent in the appointment was 80 minutes encounter with patients including review of chart and various tests results, discussions about plan  of care and coordination of care plan   [facility] Lark, MD 04/20/2020 7:36 AM

## 2020-04-20 NOTE — Assessment & Plan Note (Signed)
I will adjust her carboplatin dose accordingly

## 2020-04-20 NOTE — Assessment & Plan Note (Signed)
The goal of treatment is curative intent

## 2020-04-20 NOTE — Assessment & Plan Note (Signed)
I recommend she hold Arimidex while on chemotherapy This is discussed with her breast oncologist and he is in agreement

## 2020-04-20 NOTE — Assessment & Plan Note (Signed)
She has abdominal ascites on exam She is arrange for paracentesis next week.

## 2020-04-20 NOTE — Assessment & Plan Note (Signed)
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  We reviewed the NCCN guidelines We discussed the role of neoadjuvant chemotherapy. The intent is of curative intent.  We discussed some of the risks, benefits, side-effects of carboplatin & Taxol. Treatment is intravenous, every 3 weeks x 6 cycles She will get repeat imaging study performed after cycle 3 to assess for objective assessment of response to therapy If she have good clinical response, we will interrupt chemotherapy for interval debulking surgery She will get monthly tumor marker monitoring while on treatment  Some of the short term side-effects included, though not limited to, including weight loss, life threatening infections, risk of allergic reactions, need for transfusions of blood products, nausea, vomiting, change in bowel habits, loss of hair, admission to hospital for various reasons, and risks of death.   Long term side-effects are also discussed including risks of infertility, permanent damage to nerve function, hearing loss, chronic fatigue, kidney damage with possibility needing hemodialysis, and rare secondary malignancy including bone marrow disorders.  The patient is aware that the response rates discussed earlier is not guaranteed.  After a long discussion, patient made an informed decision to proceed with the prescribed plan of care.   Patient education material was dispensed. We discussed premedication with dexamethasone before chemotherapy. I recommend port placement before treatment starts Due to pre-existing peripheral neuropathy, I plan upfront dose reduction of Taxol

## 2020-04-20 NOTE — Telephone Encounter (Signed)
Scheduled appt per 5/20 sch message - pt is aware of appt

## 2020-04-20 NOTE — Assessment & Plan Note (Signed)
I plan upfront dose reduction of Taxol due to pre-existing neuropathy

## 2020-04-20 NOTE — Assessment & Plan Note (Signed)
This is likely due to compression of venous drainage from her abdominal mass She had previous work-up which rule out DVT I do not recommend her to use a diuretic therapy Recommend leg elevation only

## 2020-04-21 ENCOUNTER — Other Ambulatory Visit: Payer: Self-pay | Admitting: Internal Medicine

## 2020-04-21 MED ORDER — FUROSEMIDE 20 MG PO TABS
ORAL_TABLET | ORAL | 0 refills | Status: DC
Start: 2020-04-21 — End: 2020-05-03

## 2020-04-23 ENCOUNTER — Other Ambulatory Visit: Payer: Self-pay | Admitting: Gynecologic Oncology

## 2020-04-23 ENCOUNTER — Telehealth: Payer: Self-pay | Admitting: Oncology

## 2020-04-23 ENCOUNTER — Other Ambulatory Visit: Payer: Self-pay

## 2020-04-23 ENCOUNTER — Other Ambulatory Visit: Payer: Self-pay | Admitting: Student

## 2020-04-23 ENCOUNTER — Encounter (HOSPITAL_COMMUNITY): Payer: Self-pay

## 2020-04-23 ENCOUNTER — Ambulatory Visit (HOSPITAL_COMMUNITY)
Admission: RE | Admit: 2020-04-23 | Discharge: 2020-04-23 | Disposition: A | Discharge status: Home / Self Care | Payer: PPO | Source: Ambulatory Visit | Attending: Gynecologic Oncology | Admitting: Gynecologic Oncology

## 2020-04-23 DIAGNOSIS — N9489 Other specified conditions associated with female genital organs and menstrual cycle: Secondary | ICD-10-CM

## 2020-04-23 DIAGNOSIS — C800 Disseminated malignant neoplasm, unspecified: Secondary | ICD-10-CM

## 2020-04-23 DIAGNOSIS — C569 Malignant neoplasm of unspecified ovary: Secondary | ICD-10-CM | POA: Diagnosis not present

## 2020-04-23 DIAGNOSIS — R918 Other nonspecific abnormal finding of lung field: Secondary | ICD-10-CM | POA: Diagnosis not present

## 2020-04-23 DIAGNOSIS — N838 Other noninflammatory disorders of ovary, fallopian tube and broad ligament: Secondary | ICD-10-CM | POA: Diagnosis not present

## 2020-04-23 HISTORY — DX: Malignant neoplasm of unspecified ovary: C56.9

## 2020-04-23 MED ORDER — LIDOCAINE HCL 1 % IJ SOLN
INTRAMUSCULAR | Status: AC
  Filled 2020-04-23: qty 20

## 2020-04-23 MED ORDER — IOHEXOL 300 MG/ML  SOLN
75.0000 mL | Freq: Once | INTRAMUSCULAR | Status: AC | PRN
  Administered 2020-04-23: 75 mL via INTRAVENOUS

## 2020-04-23 MED ORDER — SODIUM CHLORIDE (PF) 0.9 % IJ SOLN
INTRAMUSCULAR | Status: AC
  Filled 2020-04-23: qty 50

## 2020-04-23 NOTE — Telephone Encounter (Signed)
Kayla Baker called and said there was not enough fluid to have a paracentesis this morning.  She also said the swelling in her right leg is worse.  She is trying to keep the leg elevated and wears a compression stocking which is now very tight around her knee.  She had stopped taking furosemide and potassium per Dr. Charisse March advice.  She is wondering if she should start taking it again to help the swelling go down.  She is out of both medications and uses [facility] if a refill needs to be sent.

## 2020-04-23 NOTE — Progress Notes (Signed)
Patient ID: Kayla Baker, female   DOB: 11-01-1936, 84 y.o.   MRN: [id number] Pt presented to [place] dept today for paracentesis. On limited US abd in all four quadrants there is only a small amount of ascites present, none of which is safely accessible for aspiration at this time. Paracentesis cancelled and pt informed. She is scheduled for mesenteric vs nodal mass biopsy later this week. Images were also reviewed by Dr. Kathlene Cote.

## 2020-04-24 ENCOUNTER — Ambulatory Visit (HOSPITAL_COMMUNITY)
Admission: RE | Admit: 2020-04-24 | Discharge: 2020-04-24 | Disposition: A | Discharge status: Home / Self Care | Payer: PPO | Source: Ambulatory Visit | Attending: Hematology and Oncology | Admitting: Hematology and Oncology

## 2020-04-24 ENCOUNTER — Ambulatory Visit (HOSPITAL_COMMUNITY)
Admission: RE | Admit: 2020-04-24 | Discharge: 2020-04-24 | Disposition: A | Discharge status: Home / Self Care | Payer: PPO | Source: Ambulatory Visit | Attending: Gynecologic Oncology | Admitting: Gynecologic Oncology

## 2020-04-24 ENCOUNTER — Encounter (HOSPITAL_COMMUNITY): Payer: Self-pay

## 2020-04-24 ENCOUNTER — Telehealth: Payer: Self-pay | Admitting: *Deleted

## 2020-04-24 ENCOUNTER — Other Ambulatory Visit: Payer: Self-pay

## 2020-04-24 ENCOUNTER — Ambulatory Visit (HOSPITAL_COMMUNITY): Payer: PPO

## 2020-04-24 ENCOUNTER — Other Ambulatory Visit (HOSPITAL_COMMUNITY): Payer: Self-pay | Admitting: Gynecologic Oncology

## 2020-04-24 DIAGNOSIS — C481 Malignant neoplasm of specified parts of peritoneum: Secondary | ICD-10-CM | POA: Insufficient documentation

## 2020-04-24 DIAGNOSIS — R32 Unspecified urinary incontinence: Secondary | ICD-10-CM | POA: Insufficient documentation

## 2020-04-24 DIAGNOSIS — M109 Gout, unspecified: Secondary | ICD-10-CM | POA: Diagnosis not present

## 2020-04-24 DIAGNOSIS — R188 Other ascites: Secondary | ICD-10-CM

## 2020-04-24 DIAGNOSIS — E785 Hyperlipidemia, unspecified: Secondary | ICD-10-CM | POA: Diagnosis not present

## 2020-04-24 DIAGNOSIS — Z7984 Long term (current) use of oral hypoglycemic drugs: Secondary | ICD-10-CM | POA: Diagnosis not present

## 2020-04-24 DIAGNOSIS — N9489 Other specified conditions associated with female genital organs and menstrual cycle: Secondary | ICD-10-CM

## 2020-04-24 DIAGNOSIS — E119 Type 2 diabetes mellitus without complications: Secondary | ICD-10-CM | POA: Diagnosis not present

## 2020-04-24 DIAGNOSIS — Z79899 Other long term (current) drug therapy: Secondary | ICD-10-CM | POA: Insufficient documentation

## 2020-04-24 DIAGNOSIS — Z9221 Personal history of antineoplastic chemotherapy: Secondary | ICD-10-CM | POA: Insufficient documentation

## 2020-04-24 DIAGNOSIS — Z923 Personal history of irradiation: Secondary | ICD-10-CM | POA: Insufficient documentation

## 2020-04-24 DIAGNOSIS — Z7982 Long term (current) use of aspirin: Secondary | ICD-10-CM | POA: Insufficient documentation

## 2020-04-24 DIAGNOSIS — C801 Malignant (primary) neoplasm, unspecified: Secondary | ICD-10-CM | POA: Diagnosis not present

## 2020-04-24 DIAGNOSIS — K219 Gastro-esophageal reflux disease without esophagitis: Secondary | ICD-10-CM | POA: Diagnosis not present

## 2020-04-24 DIAGNOSIS — I10 Essential (primary) hypertension: Secondary | ICD-10-CM | POA: Insufficient documentation

## 2020-04-24 DIAGNOSIS — Z853 Personal history of malignant neoplasm of breast: Secondary | ICD-10-CM | POA: Insufficient documentation

## 2020-04-24 DIAGNOSIS — Z888 Allergy status to other drugs, medicaments and biological substances status: Secondary | ICD-10-CM | POA: Insufficient documentation

## 2020-04-24 DIAGNOSIS — C569 Malignant neoplasm of unspecified ovary: Secondary | ICD-10-CM | POA: Diagnosis not present

## 2020-04-24 DIAGNOSIS — E559 Vitamin D deficiency, unspecified: Secondary | ICD-10-CM | POA: Diagnosis not present

## 2020-04-24 DIAGNOSIS — Z170 Estrogen receptor positive status [ER+]: Secondary | ICD-10-CM

## 2020-04-24 DIAGNOSIS — Z881 Allergy status to other antibiotic agents status: Secondary | ICD-10-CM | POA: Insufficient documentation

## 2020-04-24 DIAGNOSIS — K668 Other specified disorders of peritoneum: Secondary | ICD-10-CM | POA: Diagnosis not present

## 2020-04-24 DIAGNOSIS — R180 Malignant ascites: Secondary | ICD-10-CM | POA: Diagnosis not present

## 2020-04-24 DIAGNOSIS — Z5111 Encounter for antineoplastic chemotherapy: Secondary | ICD-10-CM | POA: Diagnosis not present

## 2020-04-24 DIAGNOSIS — C50919 Malignant neoplasm of unspecified site of unspecified female breast: Secondary | ICD-10-CM | POA: Diagnosis not present

## 2020-04-24 DIAGNOSIS — C800 Disseminated malignant neoplasm, unspecified: Secondary | ICD-10-CM

## 2020-04-24 HISTORY — PX: IR IMAGING GUIDED PORT INSERTION: IMG5740

## 2020-04-24 LAB — CBC
HCT: 35.3 % — ABNORMAL LOW (ref 36.0–46.0)
Hemoglobin: 11 g/dL — ABNORMAL LOW (ref 12.0–15.0)
MCH: 28.1 pg (ref 26.0–34.0)
MCHC: 31.2 g/dL (ref 30.0–36.0)
MCV: 90.1 fL (ref 80.0–100.0)
Platelets: 275 10*3/uL (ref 150–400)
RBC: 3.92 MIL/uL (ref 3.87–5.11)
RDW: 14.6 % (ref 11.5–15.5)
WBC: 8.1 10*3/uL (ref 4.0–10.5)
nRBC: 0 % (ref 0.0–0.2)

## 2020-04-24 LAB — GLUCOSE, CAPILLARY: Glucose-Capillary: 89 mg/dL (ref 70–99)

## 2020-04-24 MED ORDER — MIDAZOLAM HCL 2 MG/2ML IJ SOLN
INTRAMUSCULAR | Status: AC
  Filled 2020-04-24: qty 4

## 2020-04-24 MED ORDER — MIDAZOLAM HCL 2 MG/2ML IJ SOLN
INTRAMUSCULAR | Status: AC | PRN
  Administered 2020-04-24: 1 mg via INTRAVENOUS

## 2020-04-24 MED ORDER — FENTANYL CITRATE (PF) 100 MCG/2ML IJ SOLN
INTRAMUSCULAR | Status: AC | PRN
  Administered 2020-04-24: 25 ug via INTRAVENOUS

## 2020-04-24 MED ORDER — SODIUM CHLORIDE 0.9 % IV SOLN: INTRAVENOUS | Status: DC

## 2020-04-24 MED ORDER — LIDOCAINE-EPINEPHRINE 1 %-1:100000 IJ SOLN
INTRAMUSCULAR | Status: AC
  Filled 2020-04-24: qty 1

## 2020-04-24 MED ORDER — VANCOMYCIN HCL IN DEXTROSE 1-5 GM/200ML-% IV SOLN
INTRAVENOUS | Status: AC
  Administered 2020-04-24: 1000 mg via INTRAVENOUS
  Filled 2020-04-24: qty 200

## 2020-04-24 MED ORDER — FENTANYL CITRATE (PF) 100 MCG/2ML IJ SOLN
INTRAMUSCULAR | Status: AC
  Filled 2020-04-24: qty 4

## 2020-04-24 MED ORDER — LIDOCAINE HCL 1 % IJ SOLN
INTRAMUSCULAR | Status: AC | PRN
  Administered 2020-04-24: 10 mL

## 2020-04-24 MED ORDER — GELATIN ABSORBABLE 12-7 MM EX MISC
CUTANEOUS | Status: AC
  Filled 2020-04-24: qty 1

## 2020-04-24 MED ORDER — VANCOMYCIN HCL IN DEXTROSE 1-5 GM/200ML-% IV SOLN: 1000.0000 mg | Freq: Once | INTRAVENOUS | Status: AC

## 2020-04-24 MED ORDER — HEPARIN SOD (PORK) LOCK FLUSH 100 UNIT/ML IV SOLN
INTRAVENOUS | Status: AC | PRN
  Administered 2020-04-24: 500 [IU] via INTRAVENOUS

## 2020-04-24 MED ORDER — MIDAZOLAM HCL 2 MG/2ML IJ SOLN
INTRAMUSCULAR | Status: AC | PRN
  Administered 2020-04-24 (×2): 0.5 mg via INTRAVENOUS

## 2020-04-24 MED ORDER — HEPARIN SOD (PORK) LOCK FLUSH 100 UNIT/ML IV SOLN
INTRAVENOUS | Status: AC
  Filled 2020-04-24: qty 5

## 2020-04-24 NOTE — Telephone Encounter (Signed)
Left a message for Hanni advising her of message below from Dr. [name].  Requested a return call if she has any questions.

## 2020-04-24 NOTE — Discharge Instructions (Addendum)
Needle Biopsy, Care After These instructions tell you how to care for yourself after your procedure. Your doctor may also give you more specific instructions. Call your doctor if you have any problems or questions. What can I expect after the procedure? After the procedure, it is common to have:  Soreness.  Bruising.  Mild pain. Follow these instructions at home:   Return to your normal activities as told by your doctor. Ask your doctor what activities are safe for you.  Take over-the-counter and prescription medicines only as told by your doctor.  Wash your hands with soap and water before you change your bandage (dressing). If you cannot use soap and water, use hand sanitizer.  Follow instructions from your doctor about: ? How to take care of your puncture site. ? When and how to change your bandage. ? When to remove your bandage.  Check your puncture site every day for signs of infection. Watch for: ? Redness, swelling, or pain. ? Fluid or blood. ? Pus or a bad smell. ? Warmth.  Do not take baths, swim, or use a hot tub until your doctor approves. Ask your doctor if you may take showers. You may only be allowed to take sponge baths.  Keep all follow-up visits as told by your doctor. This is important. Contact a doctor if you have:  A fever.  Redness, swelling, or pain at the puncture site, and it lasts longer than a few days.  Fluid, blood, or pus coming from the puncture site.  Warmth coming from the puncture site. Get help right away if:  You have a lot of bleeding from the puncture site. Summary  After the procedure, it is common to have soreness, bruising, or mild pain at the puncture site.  Check your puncture site every day for signs of infection, such as redness, swelling, or pain.  Get help right away if you have severe bleeding from your puncture site. This information is not intended to replace advice given to you by your health care provider. Make  sure you discuss any questions you have with your health care provider. Document Revised: 11/30/2017 Document Reviewed: 11/30/2017 Elsevier Patient Education  [place] Insertion, Care After This sheet gives you information about how to care for yourself after your procedure. Your health care provider may also give you more specific instructions. If you have problems or questions, contact your health care provider. What can I expect after the procedure? After the procedure, it is common to have:  Discomfort at the port insertion site.  Bruising on the skin over the port. This should improve over 3-4 days. Follow these instructions at home: [facility] care  After your port is placed, you will get a manufacturer's information card. The card has information about your port. Keep this card with you at all times.  Take care of the port as told by your health care provider. Ask your health care provider if you or a family member can get training for taking care of the port at home. A home health care nurse may also take care of the port.  Make sure to remember what type of port you have. Incision care      Follow instructions from your health care provider about how to take care of your port insertion site. Make sure you: ? Wash your hands with soap and water before and after you change your bandage (dressing). If soap and water are not available, use hand sanitizer. ?  Change your dressing as told by your health care provider. ? Leave stitches (sutures), skin glue, or adhesive strips in place. These skin closures may need to stay in place for 2 weeks or longer. If adhesive strip edges start to loosen and curl up, you may trim the loose edges. Do not remove adhesive strips completely unless your health care provider tells you to do that.  Check your port insertion site every day for signs of infection. Check for: ? Redness, swelling, or pain. ? Fluid or  blood. ? Warmth. ? Pus or a bad smell. Activity  Return to your normal activities as told by your health care provider. Ask your health care provider what activities are safe for you.  Do not lift anything that is heavier than 10 lb (4.5 kg), or the limit that you are told, until your health care provider says that it is safe. General instructions  Take over-the-counter and prescription medicines only as told by your health care provider.  Do not take baths, swim, or use a hot tub until your health care provider approves. Ask your health care provider if you may take showers. You may only be allowed to take sponge baths.  Do not drive for 24 hours if you were given a sedative during your procedure.  Wear a medical alert bracelet in case of an emergency. This will tell any health care providers that you have a port.  Keep all follow-up visits as told by your health care provider. This is important. Contact a health care provider if:  You cannot flush your port with saline as directed, or you cannot draw blood from the port.  You have a fever or chills.  You have redness, swelling, or pain around your port insertion site.  You have fluid or blood coming from your port insertion site.  Your port insertion site feels warm to the touch.  You have pus or a bad smell coming from the port insertion site. Get help right away if:  You have chest pain or shortness of breath.  You have bleeding from your port that you cannot control. Summary  Take care of the port as told by your health care provider. Keep the manufacturer's information card with you at all times.  Change your dressing as told by your health care provider.  Contact a health care provider if you have a fever or chills or if you have redness, swelling, or pain around your port insertion site.  Keep all follow-up visits as told by your health care provider. This information is not intended to replace advice given to  you by your health care provider. Make sure you discuss any questions you have with your health care provider. Document Revised: 06/15/2018 Document Reviewed: 06/15/2018 Elsevier Patient Education  [place]. Moderate Conscious Sedation, Adult Sedation is the use of medicines to promote relaxation and relieve discomfort and anxiety. Moderate conscious sedation is a type of sedation. Under moderate conscious sedation, you are less alert than normal, but you are still able to respond to instructions, touch, or both. Moderate conscious sedation is used during short medical and dental procedures. It is milder than deep sedation, which is a type of sedation under which you cannot be easily woken up. It is also milder than general anesthesia, which is the use of medicines to make you unconscious. Moderate conscious sedation allows you to return to your regular activities sooner. Tell a health care provider about:  Any allergies you have.  All medicines you are taking, including vitamins, herbs, eye drops, creams, and over-the-counter medicines.  Use of steroids (by mouth or creams).  Any problems you or family members have had with sedatives and anesthetic medicines.  Any blood disorders you have.  Any surgeries you have had.  Any medical conditions you have, such as sleep apnea.  Whether you are pregnant or may be pregnant.  Any use of cigarettes, alcohol, marijuana, or street drugs. What are the risks? Generally, this is a safe procedure. However, problems may occur, including:  Getting too much medicine (oversedation).  Nausea.  Allergic reaction to medicines.  Trouble breathing. If this happens, a breathing tube may be used to help with breathing. It will be removed when you are awake and breathing on your own.  Heart trouble.  Lung trouble. What happens before the procedure? Staying hydrated Follow instructions from your health care provider about hydration, which may  include:  Up to 2 hours before the procedure - you may continue to drink clear liquids, such as water, clear fruit juice, black coffee, and plain tea. Eating and drinking restrictions Follow instructions from your health care provider about eating and drinking, which may include:  8 hours before the procedure - stop eating heavy meals or foods such as meat, fried foods, or fatty foods.  6 hours before the procedure - stop eating light meals or foods, such as toast or cereal.  6 hours before the procedure - stop drinking milk or drinks that contain milk.  2 hours before the procedure - stop drinking clear liquids. Medicine Ask your health care provider about:  Changing or stopping your regular medicines. This is especially important if you are taking diabetes medicines or blood thinners.  Taking medicines such as aspirin and ibuprofen. These medicines can thin your blood. Do not take these medicines before your procedure if your health care provider instructs you not to.  Tests and exams  You will have a physical exam.  You may have blood tests done to show: ? How well your kidneys and liver are working. ? How well your blood can clot. General instructions  Plan to have someone take you home from the hospital or clinic.  If you will be going home right after the procedure, plan to have someone with you for 24 hours. What happens during the procedure?  An IV tube will be inserted into one of your veins.  Medicine to help you relax (sedative) will be given through the IV tube.  The medical or dental procedure will be performed. What happens after the procedure?  Your blood pressure, heart rate, breathing rate, and blood oxygen level will be monitored often until the medicines you were given have worn off.  Do not drive for 24 hours. This information is not intended to replace advice given to you by your health care provider. Make sure you discuss any questions you have with  your health care provider. Document Revised: 10/30/2017 Document Reviewed: 03/08/2016 Elsevier Patient Education  2020 [employer].

## 2020-04-24 NOTE — Procedures (Signed)
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 Technically successful image guided paracentesis yielding 800 cc of bloody ascitic fluid.   EBL: Trace Complications: None immediate.   Ronny Bacon, MD Pager #: [phone]

## 2020-04-24 NOTE — Consult Note (Signed)
Chief Complaint: Chemotherapy access Omental Caking suspected gyn malignancy    Referring Physician(s): Dr. Natale Lay  - portacath Dr. Dorian Pod -  Mesenteric biopsy  Supervising Physician: Sandi Mariscal  Patient Status: [facility] - Out-pt  History of Present Illness: Kayla Baker is a 84 y.o. female History of breast cancer ( left 2017) s/p lumpectomy with new onset of abdominal distention and urinary incontinence.. Team is requesting portacath placement for chemotherapy access and a mesenteric biopsy for possible tissues diagnosis of gynecology malignancy (suspected ovarian).    Past Medical History:  Diagnosis Date  . Allergy   . Anemia   . Arthritis   . Blood transfusion without reported diagnosis   . Breast cancer of upper-inner quadrant of left female breast ([place]) 02/29/2016   skin- 2016- squamous- on right upper arm  . Diet-controlled type 2 diabetes mellitus ([place])    diet and exercsise,Has never been on meds, type II   . GERD (gastroesophageal reflux disease)   . Gout    takes Allopurinol daily  . History of chemotherapy   . History of colon polyps    benign  . History of shingles   . Hx of radiation therapy   . Hyperlipidemia   . Hypertension   . Ovarian ca ([place]) dx'd 03/2020  . Personal history of chemotherapy    2017  . Personal history of radiation therapy    2017  . Vitamin D deficiency     Past Surgical History:  Procedure Laterality Date  . ABDOMINAL HYSTERECTOMY  1973  . BREAST BIOPSY Left 02/26/2016  . BREAST LUMPECTOMY Left 03/18/2016  . CARDIAC CATHETERIZATION     patient denies this procedure  . CATARACT EXTRACTION, BILATERAL  2014   right eye 2/14; left eye 3/3  . COLONOSCOPY    . HAND SURGERY Left 2012  . INCONTINENCE SURGERY  2006  . KNEE ARTHROSCOPY Right   . MASTOPEXY Right 06/16/2017   Procedure: RIGHT BREAST MASTOPEXY;  Surgeon: Irene Limbo, MD;  Location: [place];  Service: Plastics;  Laterality: Right;  .  PORT-A-CATH REMOVAL N/A 11/27/2016   Procedure: REMOVAL PORT-A-CATH;  Surgeon: Fanny Skates, MD;  Location: [facility];  Service: General;  Laterality: N/A;  . PORTACATH PLACEMENT N/A 04/14/2016   Procedure: INSERTION PORT-A-CATH ;  Surgeon: Fanny Skates, MD;  Location: [place];  Service: General;  Laterality: N/A;  . RADIOACTIVE SEED GUIDED PARTIAL MASTECTOMY WITH AXILLARY SENTINEL LYMPH NODE BIOPSY Left 03/18/2016   Procedure: RADIOACTIVE SEED GUIDED LEFT PARTIAL MASTECTOMY WITH AXILLARY SENTINEL LYMPH NODE BIOPSY AND BLUE DYE INJECTION;  Surgeon: Fanny Skates, MD;  Location: [place];  Service: General;  Laterality: Left;  . REDUCTION MAMMAPLASTY Right 2018    Allergies: Keflex [cephalexin], Meloxicam, Prednisone, Premarin [estrogens conjugated], and Trovan [alatrofloxacin]  Medications: Prior to Admission medications   Medication Sig Start Date End Date Taking? Authorizing Provider  allopurinol (ZYLOPRIM) 300 MG tablet Take 1 tablet Daily to Prevent Gout Patient taking differently: Take 300 mg by mouth daily.  10/24/19  Yes [name], MD  aspirin 81 MG tablet Take 81 mg by mouth daily.   Yes [provider]  Cholecalciferol (VITAMIN D3) 5000 units CAPS Take 5,000 Units by mouth daily.   Yes [provider]  ezetimibe (ZETIA) 10 MG tablet Take 1 tablet Daily for Cholesterol Patient taking differently: Take 10 mg by mouth daily.  02/09/20  Yes [name], MD  furosemide (LASIX) 20 MG tablet Take 1 tablet Daily for  Fluid Retention / Ankle Swelling 04/21/20  Yes [name], MD  gabapentin (NEURONTIN) 300 MG capsule Take 1 capsule 3 to 4  x /day as needed for pain Patient taking differently: Take 300 mg by mouth See admin instructions. Take 300 mg 3 times daily may take a 4th 300 mg dose as needed for pain 08/24/19  Yes [name], MD  glucose blood (FREESTYLE TEST STRIPS) test strip Check Blood Sugar Daily as directed (Dx: E11.29) 10/28/19  Yes [name], MD  Lancets (FREESTYLE) lancets CHECK BLOOD SUGAR DAILY AS DIRECTED 07/31/19  Yes [name], MD  loratadine (CLARITIN) 10 MG tablet Take 10 mg by mouth daily as needed for allergies.   Yes [provider]  Magnesium ([employer] MAGNESIUM) 250 MG TABS Take 500 mg by mouth 2 (two) times daily.   Yes [provider]  metFORMIN (GLUCOPHAGE-XR) 500 MG 24 hr tablet Take 2 tablet 2 times a day for blood sugar. Patient taking differently: Take 500 mg by mouth 2 (two) times daily.  02/24/20  Yes [name], MD  metoprolol succinate (TOPROL-XL) 25 MG 24 hr tablet Take 1 tablet Daily for BP Patient taking differently: Take 25 mg by mouth daily.  09/17/19  Yes [name], MD  mirabegron ER (MYRBETRIQ) 50 MG TB24 tablet Take 1 tablet (50 mg total) by mouth daily. 04/16/20  Yes Liane Comber, NP  rosuvastatin (CRESTOR) 5 MG tablet Take 1 tablet 3 x /week on - Mon Wed Fri - for Cholesterol Patient taking differently: Take 5 mg by mouth every Monday, Wednesday, and Friday.  04/06/20  Yes [name], MD  vitamin C (ASCORBIC ACID) 500 MG tablet Take 500 mg by mouth every evening.    Yes [provider]  acetaminophen (TYLENOL) 325 MG tablet Take 325 mg by mouth every 6 (six) hours as needed for headache.    [provider]  dexamethasone (DECADRON) 4 MG tablet Take 2 tabs at the night before and 2 tab the morning of chemotherapy, every 3 weeks, by mouth 04/19/20   Heath Lark, MD  lidocaine-prilocaine (EMLA) cream Apply to affected area once Patient taking differently: Apply 1 application topically daily as needed (port access).  04/19/20   Heath Lark, MD  ondansetron (ZOFRAN) 8 MG tablet Take 1 tablet (8 mg total) by mouth 2 (two) times daily as needed for refractory nausea / vomiting. Start on day 3 after carboplatin chemo. 04/19/20   Heath Lark, MD  prochlorperazine (COMPAZINE) 10 MG tablet Take 1 tablet (10 mg total) by mouth every 6 (six) hours as needed  (Nausea or vomiting). 04/19/20   Heath Lark, MD     Family History  Problem Relation Age of Onset  . Stroke Mother 9  . Other Mother        history of hysterectomy after last childbirth  . Diabetes Father   . Alzheimer's disease Father   . Hypertension Sister   . Lung cancer Sister 29       smoker  . Other Sister        history of hysterectomy for fibroids  . Breast cancer Sister 64  . Esophageal cancer Maternal Aunt 74       not a smoker  . Heart attack Maternal Uncle 65  . Cirrhosis Sister   . Lung cancer Other        nephew dx. 58s; +smoker  . Epilepsy Other   . Other Other 12       great niece dx.  benign ganglioglioma brain tumor; [facility] at [facility]  . Epilepsy Other        no seizures in 2 years  . Parkinson's disease Maternal Aunt   . Stroke Maternal Grandmother   . Heart Problems Maternal Grandmother   . Pancreatic cancer Cousin 1       paternal 1st cousin  . Colon cancer Neg Hx   . Stomach cancer Neg Hx   . Colon polyps Neg Hx   . Ulcerative colitis Neg Hx     Social History   Socioeconomic History  . Marital status: Married    Spouse name: [name]  . Number of children: 0  . Years of education: Not on file  . Highest education level: Not on file  Occupational History  . Not on file  Tobacco Use  . Smoking status: Never Smoker  . Smokeless tobacco: Never Used  Substance and Sexual Activity  . Alcohol use: No  . Drug use: No  . Sexual activity: Not on file  Other Topics Concern  . Not on file  Social History Narrative  . Not on file   Social Determinants of Health   Financial Resource Strain:   . Difficulty of Paying Living Expenses:   Food Insecurity:   . Worried About Charity fundraiser in the Last Year:   . Arboriculturist in the Last Year:   Transportation Needs:   . Film/video editor (Medical):   Marland Kitchen Lack of Transportation (Non-Medical):   Physical Activity:   . Days of Exercise per Week:   . Minutes of Exercise per Session:     Stress:   . Feeling of Stress :   Social Connections:   . Frequency of Communication with Friends and Family:   . Frequency of Social Gatherings with Friends and Family:   . Attends Religious Services:   . Active Member of Clubs or Organizations:   . Attends Archivist Meetings:   Marland Kitchen Marital Status:     Review of Systems: A 12 point ROS discussed and pertinent positives are indicated in the HPI above.  All other systems are negative.  Review of Systems  Constitutional: Negative for fatigue and fever.  HENT: Negative for congestion.   Respiratory: Negative for cough and shortness of breath.   Gastrointestinal: Negative for abdominal pain, diarrhea, nausea and vomiting.    Vital Signs: BP 121/65   Pulse 88   Temp 98.1 F (36.7 C) (Oral)   Resp 16   Ht 5' 4"  (1.626 m)   Wt 146 lb (66.2 kg)   SpO2 96%   BMI 25.06 kg/m   Physical Exam Vitals and nursing note reviewed.  Constitutional:      Appearance: She is well-developed.  HENT:     Head: Normocephalic and atraumatic.  Eyes:     Conjunctiva/sclera: Conjunctivae normal.  Cardiovascular:     Rate and Rhythm: Normal rate and regular rhythm.     Heart sounds: Normal heart sounds.  Pulmonary:     Effort: Pulmonary effort is normal.  Musculoskeletal:        General: Normal range of motion.     Cervical back: Normal range of motion.  Skin:    General: Skin is warm.  Neurological:     Mental Status: She is alert and oriented to person, place, and time.     Imaging: CT CHEST W CONTRAST  Result Date: 04/23/2020 CLINICAL DATA:  Ovarian cancer staging EXAM: CT CHEST, ABDOMEN, AND PELVIS  WITH CONTRAST TECHNIQUE: Multidetector CT imaging of the chest, abdomen and pelvis was performed following the standard protocol during bolus administration of intravenous contrast. CONTRAST:  21m OMNIPAQUE IOHEXOL 300 MG/ML SOLN, additional oral enteric contrast COMPARISON:  MR pelvis, 04/15/2020 FINDINGS: CT CHEST FINDINGS  Cardiovascular: Aortic atherosclerosis. Normal heart size. No pericardial effusion. Mediastinum/Nodes: There are prominent subcentimeter epicardial lymph nodes adjacent to the right atrium (series 2, image 45). No other enlarged mediastinal, hilar, or axillary lymph nodes. Thyroid gland, trachea, and esophagus demonstrate no significant findings. Lungs/Pleura: There are multiple small subpleural nodules at the right lung base overlying the diaphragm, measuring up to 7 mm (series 6, image 102, no pleural effusion or pneumothorax. Musculoskeletal: No chest wall mass or suspicious bone lesions identified. Postoperative findings of left lumpectomy and axillary lymph node dissection. CT ABDOMEN PELVIS FINDINGS Hepatobiliary: Somewhat coarse contour of the liver. No gallstones, gallbladder wall thickening, or biliary dilatation. Pancreas: Unremarkable. No pancreatic ductal dilatation or surrounding inflammatory changes. Spleen: Normal in size without significant abnormality. Adrenals/Urinary Tract: Adrenal glands are unremarkable. Kidneys are normal, without renal calculi, solid lesion, or hydronephrosis. Bladder is unremarkable. Stomach/Bowel: Stomach is within normal limits. Appendix appears normal. No evidence of bowel wall thickening, distention, or inflammatory changes. Vascular/Lymphatic: No significant vascular findings are present. Numerous bulky retroperitoneal, bilateral iliac, and pelvic sidewall lymph nodes, an index aortocaval node measuring 2.7 x 2.1 cm (series 2, image 78). Reproductive: Redemonstrated dominant mixed solid and cystic mass arising from the vicinity of the right ovary measuring at least 11.5 x 10.7 cm, not significantly changed compared to prior MR (series 2, image 104). Status post hysterectomy. Other: No abdominal wall hernia or abnormality. Moderate volume ascites throughout the abdomen and pelvis with subtle thickening and nodularity throughout the peritoneum, and extensive bulky nodular  metastatic disease of the omentum (series 2, image 102). Musculoskeletal: No acute or significant osseous findings. IMPRESSION: 1. Redemonstrated dominant mixed solid and cystic mass arising from the vicinity of the right ovary measuring at least 11.5 x 10.7 cm, not significantly changed compared to prior MR, and consistent with primary ovarian malignancy. 2. Numerous bulky retroperitoneal, bilateral iliac, and pelvic sidewall lymph nodes. 3. Moderate volume ascites throughout the abdomen and pelvis with subtle thickening and nodularity throughout the peritoneum, and extensive bulky nodular metastatic disease of the omentum. 4. Constellation of findings is consistent with advanced nodal and peritoneal metastatic disease. 5. There are prominent subcentimeter epicardial lymph nodes, nonspecific although suspicious for nodal metastatic disease. No definite nodal metastatic disease in the chest. Attention on follow-up. 6. There are multiple small subpleural nodules at the right lung base overlying the diaphragm, measuring up to 7 mm. These are generally nonspecific and less favored to represent pulmonary metastatic disease given distribution. Attention on follow-up. 7. Somewhat coarse contour of the liver, suggestive of cirrhosis, although out without overt morphologic stigmata. 8. Aortic Atherosclerosis (ICD10-I70.0). Electronically Signed   By: AEddie CandleM.D.   On: 04/23/2020 08:43   MR Pelvis W Wo Contrast  Result Date: 04/15/2020 CLINICAL DATA:  Staging ovarian cancer. EXAM: MRI PELVIS WITHOUT AND WITH CONTRAST TECHNIQUE: Multiplanar multisequence MR imaging of the pelvis was performed both before and after administration of intravenous contrast. CONTRAST:  151mMULTIHANCE GADOBENATE DIMEGLUMINE 529 MG/ML IV SOLN COMPARISON:  Ultrasound dated 04/03/2020 FINDINGS: Urinary Tract: Large pelvic mass and bulky peritoneal disease has mass effect upon the dome of bladder. Loss of fat plane along the dome of bladder  and peritoneal lesions noted. Cannot rule out  serosal involvement of the bladder dome. Bowel: Imaged portions of the colon and rectum. No pathologically enlarged small or large bowel loops identified at this time to suggest a bowel obstruction. Vascular/Lymphatic: No significant vascular abnormality identified. Extensive bilateral common iliac and external iliac adenopathy is identified. Left common iliac lymph node measures 1.7 cm, image number 1/9 Right common iliac lymph node measures 1.3 cm, image 5/9. Right pelvic sidewall lymph node measures 1.7 cm, image 82/9. Left external iliac lymph node measures 1.2 cm, image 52/9 Bulky retroperitoneal adenopathy is identified within the abdomen as seen on the postcontrast coronal images. This includes a left retroperitoneal node with a short axis of 1.7 cm, image 69/11. Reproductive: Uterus: Surgically absent. Right ovary: Not seen. Large complex solid and cystic mass with enhancing soft tissue components is identified within pelvis in appears to arise from the right adnexa. This measures 10.8 by 11.5 by 11.1 cm, image 51/9 and image 22/3. This has an aggressive appearance with extensive enhancing mural soft tissue. Highly concerning for malignant ovarian neoplasm. Left ovary: Measures 1.5 x 3.1 x 3.6 cm. This contains 3 simple appearing unilocular cysts which measure up to 1.9 cm, image 77/9 and image 15/3. Other:  Ascites is identified within the abdomen and pelvis. Bulky peritoneal disease with extensive caking of the omentum is identified compatible with peritoneal carcinomatosis. Omental tumor along the anterior midline of abdomen measures 3.5 x 2.2 cm, image 8/6. Tumor implant along the right pericolic gutter measures 3.4 x 3.9 cm, image 23/3. There is diffuse enhancement and nodularity involving the peritoneal reflections. Musculoskeletal: No suspicious enhancing bone lesions. IMPRESSION: 1. Large complex solid and cystic mass arising from the right adnexa  measuring 10.8 by 11.5 by 11.1 cm. This has an aggressive appearance with extensive enhancing mural soft tissue components. Findings are highly suspicious for malignant ovarian neoplasm. 2. Extensive bilateral retroperitoneal and bilateral iliac adenopathy compatible with metastatic disease. 3. Signs of extensive peritoneal carcinomatosis including ascites, enhancement, thickening and nodularity of the peritoneal reflections, omental caking and bulky peritoneal nodularity. 4. Suspected serosal involvement of the dome of bladder with loss of normal fat plane. Cannot rule out mural invasion by tumor. Electronically Signed   By: Kerby Moors M.D.   On: 04/15/2020 15:18   CT Abdomen Pelvis W Contrast  Result Date: 04/23/2020 CLINICAL DATA:  Ovarian cancer staging EXAM: CT CHEST, ABDOMEN, AND PELVIS WITH CONTRAST TECHNIQUE: Multidetector CT imaging of the chest, abdomen and pelvis was performed following the standard protocol during bolus administration of intravenous contrast. CONTRAST:  42m OMNIPAQUE IOHEXOL 300 MG/ML SOLN, additional oral enteric contrast COMPARISON:  MR pelvis, 04/15/2020 FINDINGS: CT CHEST FINDINGS Cardiovascular: Aortic atherosclerosis. Normal heart size. No pericardial effusion. Mediastinum/Nodes: There are prominent subcentimeter epicardial lymph nodes adjacent to the right atrium (series 2, image 45). No other enlarged mediastinal, hilar, or axillary lymph nodes. Thyroid gland, trachea, and esophagus demonstrate no significant findings. Lungs/Pleura: There are multiple small subpleural nodules at the right lung base overlying the diaphragm, measuring up to 7 mm (series 6, image 102, no pleural effusion or pneumothorax. Musculoskeletal: No chest wall mass or suspicious bone lesions identified. Postoperative findings of left lumpectomy and axillary lymph node dissection. CT ABDOMEN PELVIS FINDINGS Hepatobiliary: Somewhat coarse contour of the liver. No gallstones, gallbladder wall  thickening, or biliary dilatation. Pancreas: Unremarkable. No pancreatic ductal dilatation or surrounding inflammatory changes. Spleen: Normal in size without significant abnormality. Adrenals/Urinary Tract: Adrenal glands are unremarkable. Kidneys are normal, without renal calculi, solid lesion, or  hydronephrosis. Bladder is unremarkable. Stomach/Bowel: Stomach is within normal limits. Appendix appears normal. No evidence of bowel wall thickening, distention, or inflammatory changes. Vascular/Lymphatic: No significant vascular findings are present. Numerous bulky retroperitoneal, bilateral iliac, and pelvic sidewall lymph nodes, an index aortocaval node measuring 2.7 x 2.1 cm (series 2, image 78). Reproductive: Redemonstrated dominant mixed solid and cystic mass arising from the vicinity of the right ovary measuring at least 11.5 x 10.7 cm, not significantly changed compared to prior MR (series 2, image 104). Status post hysterectomy. Other: No abdominal wall hernia or abnormality. Moderate volume ascites throughout the abdomen and pelvis with subtle thickening and nodularity throughout the peritoneum, and extensive bulky nodular metastatic disease of the omentum (series 2, image 102). Musculoskeletal: No acute or significant osseous findings. IMPRESSION: 1. Redemonstrated dominant mixed solid and cystic mass arising from the vicinity of the right ovary measuring at least 11.5 x 10.7 cm, not significantly changed compared to prior MR, and consistent with primary ovarian malignancy. 2. Numerous bulky retroperitoneal, bilateral iliac, and pelvic sidewall lymph nodes. 3. Moderate volume ascites throughout the abdomen and pelvis with subtle thickening and nodularity throughout the peritoneum, and extensive bulky nodular metastatic disease of the omentum. 4. Constellation of findings is consistent with advanced nodal and peritoneal metastatic disease. 5. There are prominent subcentimeter epicardial lymph nodes,  nonspecific although suspicious for nodal metastatic disease. No definite nodal metastatic disease in the chest. Attention on follow-up. 6. There are multiple small subpleural nodules at the right lung base overlying the diaphragm, measuring up to 7 mm. These are generally nonspecific and less favored to represent pulmonary metastatic disease given distribution. Attention on follow-up. 7. Somewhat coarse contour of the liver, suggestive of cirrhosis, although out without overt morphologic stigmata. 8. Aortic Atherosclerosis (ICD10-I70.0). Electronically Signed   By: Eddie Candle M.D.   On: 04/23/2020 08:43   US Venous Img Lower Unilateral Right (DVT)  Result Date: 04/04/2020 CLINICAL DATA:  Right lower extremity edema EXAM: RIGHT LOWER EXTREMITY VENOUS DUPLEX ULTRASOUND TECHNIQUE: Gray-scale sonography with graded compression, as well as color Doppler and duplex ultrasound were performed to evaluate the right lower extremity deep venous system from the level of the common femoral vein and including the common femoral, femoral, profunda femoral, popliteal and calf veins including the posterior tibial, peroneal and gastrocnemius veins when visible. The superficial great saphenous vein was also interrogated. Spectral Doppler was utilized to evaluate flow at rest and with distal augmentation maneuvers in the common femoral, femoral and popliteal veins. COMPARISON:  None. FINDINGS: Contralateral Common Femoral Vein: Respiratory phasicity is normal and symmetric with the symptomatic side. No evidence of thrombus. Normal compressibility. Common Femoral Vein: No evidence of thrombus. Normal compressibility, respiratory phasicity and response to augmentation. Saphenofemoral Junction: No evidence of thrombus. Normal compressibility and flow on color Doppler imaging. Profunda Femoral Vein: No evidence of thrombus. Normal compressibility and flow on color Doppler imaging. Femoral Vein: No evidence of thrombus. Normal  compressibility, respiratory phasicity and response to augmentation. Popliteal Vein: No evidence of thrombus. Normal compressibility, respiratory phasicity and response to augmentation. Calf Veins: No evidence of thrombus. Normal compressibility and flow on color Doppler imaging. Superficial Great Saphenous Vein: No evidence of thrombus. Normal compressibility. Venous Reflux:  None. Other Findings:  None. IMPRESSION: No evidence of deep venous thrombosis in the right lower extremity. Left common femoral vein also patent. Electronically Signed   By: Lowella Grip III M.D.   On: 04/04/2020 10:02   [place] ASCITES (ABDOMEN LIMITED)  Result  Date: 04/23/2020 CLINICAL DATA:  Ovarian mass, peritoneal masses and ascites. EXAM: LIMITED ABDOMEN ULTRASOUND FOR ASCITES TECHNIQUE: Limited ultrasound survey for ascites was performed in all four abdominal quadrants. COMPARISON:  CT of the abdomen and pelvis on 04/23/2020 FINDINGS: Survey of the peritoneal cavity shows relatively small amount of ascites scattered throughout the peritoneal cavity. A large enough pocket was not present to allow for safe paracentesis today. The patient is scheduled for a CT-guided biopsy of peritoneal mass later this week. IMPRESSION: Relatively small amount of scattered ascites in the peritoneal cavity. A large enough volume of fluid was not present to allow for paracentesis today. Electronically Signed   By: Aletta Edouard M.D.   On: 04/23/2020 13:07   US PELVIC COMPLETE WITH TRANSVAGINAL  Result Date: 04/03/2020 CLINICAL DATA:  Pelvic pressure and episodes of incontinence for 3 months, progressive, RIGHT leg edema, prior bladder sling, partial hysterectomy with ovarian sparing, breast cancer EXAM: TRANSABDOMINAL AND TRANSVAGINAL ULTRASOUND OF PELVIS TECHNIQUE: Both transabdominal and transvaginal ultrasound examinations of the pelvis were performed. Transabdominal technique was performed for global imaging of the pelvis including uterus,  ovaries, adnexal regions, and pelvic cul-de-sac. It was necessary to proceed with endovaginal exam following the transabdominal exam to visualize the ovaries. COMPARISON:  None FINDINGS: Uterus Surgically absent Endometrium N/A Right ovary Measurements: 3.3 x 1.6 x 2.1 cm = volume: 5.5 mL. Normal morphology without mass Left ovary No normal appearing LEFT ovary visualized see below Other findings Small to moderate free pelvic fluid. Complex cystic mass identified within the LEFT pelvis, 12.5 x 11.0 x 8.7 cm with a calculated volume of 625 mL. Diffuse low level internal echoes are present within the cystic component. Abnormal mural soft tissue density. No definite normal appearing ovarian tissue is definitely. Finding is concerning for a cystic ovarian neoplasm and correlation with serum tumors markers and further assessment by MR is recommended. IMPRESSION: Complex cystic and solid mass in LEFT adnexa 12.5 cm diameter question cystic ovarian neoplasm; recommend correlation with serum tumor markers and further evaluation by MR imaging with and without contrast. Electronically Signed   By: Lavonia Dana M.D.   On: 04/03/2020 14:56    Labs:  CBC: Recent Labs    10/25/19 0928 12/12/19 1440 02/02/20 1214 04/03/20 1136  WBC 6.0 7.4 6.6 7.3  HGB 12.3 11.6* 12.6 11.3*  HCT 37.8 35.9 39.7 36.1  PLT 178 202 209 236    COAGS: No results for input(s): INR, APTT in the last 8760 hours.  BMP: Recent Labs    12/12/19 1440 02/02/20 1214 04/03/20 1136 04/11/20 1027  NA 143 144 141 142  K 4.3 3.7 4.4 4.7  CL 105 105 105 106  CO2 29 28 28 27   GLUCOSE 79 84 83 83  BUN 27* 26* 31* 34*  CALCIUM 10.2 10.4 10.2 10.2  CREATININE 1.38* 1.15* 1.37* 1.35*  GFRNONAA 36* 45* 36* 36*  GFRAA 41* 52* 42* 42*    LIVER FUNCTION TESTS: Recent Labs    12/12/19 1440 02/02/20 1214 04/03/20 1136 04/11/20 1027  BILITOT 0.4 0.4 0.4 0.4  AST 26 26 27 25   ALT 13 14 11 10   PROT 6.9 7.3 6.8 6.8    Assessment  and Plan:  84 y.o, female outpatient. History of breast cancer ( left 2017) s/p lumpectomy with new onset of abdominal distention and urinary incontinence.. Team is requesting portacath placement for chemotherapy access and a mesenteric biopsy for possible tissues diagnosis of gynecology malignancy (suspected ovarian).  Patient previously had a right sided portacath placed by surgery.  Pertinent Imaging 5.24.21 - CT Chest  Pertinent IR History 5.24.21 - Unsuccessful paracentesis  No fluid  Pertinent Allergies Keflex Prednison   All labs and medications are within acceptable parameters.  Patient is afebrile.  Risks and benefits of image guided port-a-catheter placement was discussed with the patient including, but not limited to bleeding, infection, pneumothorax, or fibrin sheath development and need for additional procedures.  All of the patient's questions were answered, patient is agreeable to proceed. Consent signed and in chart.   Risks and benefits of mesenteric  was discussed with the patient and/or patient's family including, but not limited to bleeding, infection, damage to adjacent structures or low yield requiring additional tests.  All of the questions were answered and there is agreement to proceed.  Consent signed and in chart.    Thank you for this interesting consult.  I greatly enjoyed meeting Sulema M Etzkorn and look forward to participating in their care.  A copy of this report was sent to the requesting provider on this date.  Electronically Signed: Avel Peace, NP 04/24/2020, 7:48 AM   I spent a total of 40 Minutes    in face to face in clinical consultation, greater than 50% of which was counseling/coordinating care for portacath placement and mesenteric biopsy

## 2020-04-24 NOTE — Telephone Encounter (Signed)
Patient's daughter called regarding FMLA paper work. Explained for her to bring it to the front desk for [employer]

## 2020-04-24 NOTE — Procedures (Signed)
Pre Procedure Dx: Poor venous access Post Procedural Dx: Same  Successful placement of right IJ approach port-a-cath with tip at the superior caval atrial junction. The catheter is ready for immediate use.  Estimated Blood Loss: None  Complications: None immediate.  Ronny Bacon, MD Pager #: [phone]

## 2020-04-24 NOTE — Telephone Encounter (Signed)
I want to reiterate, the swelling is from the cancer and furosemide will not help and it can cause risks of kidney failure So, no furosemide, continue leg elevation

## 2020-04-25 LAB — CYTOLOGY - NON PAP

## 2020-04-26 ENCOUNTER — Ambulatory Visit (HOSPITAL_COMMUNITY): Payer: PPO

## 2020-04-26 LAB — SURGICAL PATHOLOGY

## 2020-04-26 NOTE — Progress Notes (Signed)
Pharmacist Chemotherapy Monitoring - Initial Assessment    Anticipated start date: 05/03/2020    Regimen:  . Are orders appropriate based on the patient's diagnosis, regimen, and cycle? Yes . Does the plan date match the patient's scheduled date? Yes . Is the sequencing of drugs appropriate? Yes . Are the premedications appropriate for the patient's regimen? Yes . Prior Authorization for treatment is: Approved o If applicable, is the correct biosimilar selected based on the patient's insurance? not applicable  Organ Function and Labs: Marland Kitchen Are dose adjustments needed based on the patient's renal function, hepatic function, or hematologic function? Yes . Are appropriate labs ordered prior to the start of patient's treatment? Yes . Other organ system assessment, if indicated: N/A . The following baseline labs, if indicated, have been ordered: N/A  Dose Assessment: . Are the drug doses appropriate? Yes . Are the following correct: o Drug concentrations Yes o IV fluid compatible with drug Yes o Administration routes Yes o Timing of therapy Yes . If applicable, does the patient have documented access for treatment and/or plans for port-a-cath placement? yes . If applicable, have lifetime cumulative doses been properly documented and assessed? not applicable Lifetime Dose Tracking  . Doxorubicin: 197.753 mg/m2 (352 mg) = 43.95 % of the maximum lifetime dose of 450 mg/m2  o   Toxicity Monitoring/Prevention: . The patient has the following take home antiemetics prescribed: Prochlorperazine . The patient has the following take home medications prescribed: N/A . Medication allergies and previous infusion related reactions, if applicable, have been reviewed and addressed. No . The patient's current medication list has been assessed for drug-drug interactions with their chemotherapy regimen. no significant drug-drug interactions were identified on review.  Order Review: . Are the treatment plan  orders signed? Yes . Is the patient scheduled to see a provider prior to their treatment? No  I verify that I have reviewed each item in the above checklist and answered each question accordingly.  Kayla Baker D 04/26/2020 8:46 AM

## 2020-04-26 NOTE — Telephone Encounter (Signed)
error 

## 2020-04-27 ENCOUNTER — Encounter: Payer: PPO | Admitting: Internal Medicine

## 2020-05-02 ENCOUNTER — Inpatient Hospital Stay: Payer: PPO | Attending: Gynecologic Oncology

## 2020-05-02 ENCOUNTER — Other Ambulatory Visit: Payer: Self-pay

## 2020-05-02 DIAGNOSIS — Z79811 Long term (current) use of aromatase inhibitors: Secondary | ICD-10-CM | POA: Diagnosis not present

## 2020-05-02 DIAGNOSIS — E1122 Type 2 diabetes mellitus with diabetic chronic kidney disease: Secondary | ICD-10-CM | POA: Diagnosis not present

## 2020-05-02 DIAGNOSIS — Z170 Estrogen receptor positive status [ER+]: Secondary | ICD-10-CM | POA: Insufficient documentation

## 2020-05-02 DIAGNOSIS — C561 Malignant neoplasm of right ovary: Secondary | ICD-10-CM | POA: Insufficient documentation

## 2020-05-02 DIAGNOSIS — Z9221 Personal history of antineoplastic chemotherapy: Secondary | ICD-10-CM | POA: Insufficient documentation

## 2020-05-02 DIAGNOSIS — Z7984 Long term (current) use of oral hypoglycemic drugs: Secondary | ICD-10-CM | POA: Insufficient documentation

## 2020-05-02 DIAGNOSIS — C786 Secondary malignant neoplasm of retroperitoneum and peritoneum: Secondary | ICD-10-CM | POA: Insufficient documentation

## 2020-05-02 DIAGNOSIS — N183 Chronic kidney disease, stage 3 unspecified: Secondary | ICD-10-CM | POA: Diagnosis not present

## 2020-05-02 DIAGNOSIS — Z90721 Acquired absence of ovaries, unilateral: Secondary | ICD-10-CM | POA: Diagnosis not present

## 2020-05-02 DIAGNOSIS — Z79899 Other long term (current) drug therapy: Secondary | ICD-10-CM | POA: Diagnosis not present

## 2020-05-02 DIAGNOSIS — C50212 Malignant neoplasm of upper-inner quadrant of left female breast: Secondary | ICD-10-CM | POA: Diagnosis not present

## 2020-05-02 DIAGNOSIS — C569 Malignant neoplasm of unspecified ovary: Secondary | ICD-10-CM

## 2020-05-02 DIAGNOSIS — N179 Acute kidney failure, unspecified: Secondary | ICD-10-CM | POA: Insufficient documentation

## 2020-05-02 DIAGNOSIS — Z923 Personal history of irradiation: Secondary | ICD-10-CM | POA: Diagnosis not present

## 2020-05-02 LAB — CMP (CANCER CENTER ONLY)
ALT: 8 U/L (ref 0–44)
AST: 27 U/L (ref 15–41)
Albumin: 3.1 g/dL — ABNORMAL LOW (ref 3.5–5.0)
Alkaline Phosphatase: 89 U/L (ref 38–126)
Anion gap: 12 (ref 5–15)
BUN: 49 mg/dL — ABNORMAL HIGH (ref 8–23)
CO2: 22 mmol/L (ref 22–32)
Calcium: 9.4 mg/dL (ref 8.9–10.3)
Chloride: 106 mmol/L (ref 98–111)
Creatinine: 2.45 mg/dL — ABNORMAL HIGH (ref 0.44–1.00)
GFR, Est AFR Am: 21 mL/min — ABNORMAL LOW (ref >60)
GFR, Estimated: 18 mL/min — ABNORMAL LOW (ref >60)
Glucose, Bld: 101 mg/dL — ABNORMAL HIGH (ref 70–99)
Potassium: 4.2 mmol/L (ref 3.5–5.1)
Sodium: 140 mmol/L (ref 135–145)
Total Bilirubin: 0.3 mg/dL (ref 0.3–1.2)
Total Protein: 6.5 g/dL (ref 6.5–8.1)

## 2020-05-02 LAB — CBC WITH DIFFERENTIAL (CANCER CENTER ONLY)
Abs Immature Granulocytes: 0.03 10*3/uL (ref 0.00–0.07)
Basophils Absolute: 0 10*3/uL (ref 0.0–0.1)
Basophils Relative: 1 %
Eosinophils Absolute: 0.2 10*3/uL (ref 0.0–0.5)
Eosinophils Relative: 2 %
HCT: 29.9 % — ABNORMAL LOW (ref 36.0–46.0)
Hemoglobin: 9.7 g/dL — ABNORMAL LOW (ref 12.0–15.0)
Immature Granulocytes: 0 %
Lymphocytes Relative: 18 %
Lymphs Abs: 1.5 10*3/uL (ref 0.7–4.0)
MCH: 28.6 pg (ref 26.0–34.0)
MCHC: 32.4 g/dL (ref 30.0–36.0)
MCV: 88.2 fL (ref 80.0–100.0)
Monocytes Absolute: 0.9 10*3/uL (ref 0.1–1.0)
Monocytes Relative: 11 %
Neutro Abs: 5.7 10*3/uL (ref 1.7–7.7)
Neutrophils Relative %: 68 %
Platelet Count: 284 10*3/uL (ref 150–400)
RBC: 3.39 MIL/uL — ABNORMAL LOW (ref 3.87–5.11)
RDW: 14.4 % (ref 11.5–15.5)
WBC Count: 8.3 10*3/uL (ref 4.0–10.5)
nRBC: 0 % (ref 0.0–0.2)

## 2020-05-03 ENCOUNTER — Encounter: Payer: Self-pay | Admitting: Oncology

## 2020-05-03 ENCOUNTER — Inpatient Hospital Stay: Payer: PPO

## 2020-05-03 ENCOUNTER — Encounter: Payer: Self-pay | Admitting: Hematology and Oncology

## 2020-05-03 ENCOUNTER — Other Ambulatory Visit: Payer: Self-pay

## 2020-05-03 ENCOUNTER — Encounter: Payer: PPO | Admitting: Internal Medicine

## 2020-05-03 ENCOUNTER — Telehealth: Payer: Self-pay | Admitting: Oncology

## 2020-05-03 ENCOUNTER — Inpatient Hospital Stay: Payer: PPO | Admitting: Hematology and Oncology

## 2020-05-03 VITALS — BP 124/65 | HR 70 | Temp 97.5°F | Resp 18

## 2020-05-03 DIAGNOSIS — C569 Malignant neoplasm of unspecified ovary: Secondary | ICD-10-CM

## 2020-05-03 DIAGNOSIS — E1140 Type 2 diabetes mellitus with diabetic neuropathy, unspecified: Secondary | ICD-10-CM | POA: Diagnosis not present

## 2020-05-03 DIAGNOSIS — E1122 Type 2 diabetes mellitus with diabetic chronic kidney disease: Secondary | ICD-10-CM

## 2020-05-03 DIAGNOSIS — C50212 Malignant neoplasm of upper-inner quadrant of left female breast: Secondary | ICD-10-CM

## 2020-05-03 DIAGNOSIS — R600 Localized edema: Secondary | ICD-10-CM

## 2020-05-03 DIAGNOSIS — D630 Anemia in neoplastic disease: Secondary | ICD-10-CM

## 2020-05-03 DIAGNOSIS — C561 Malignant neoplasm of right ovary: Secondary | ICD-10-CM | POA: Diagnosis not present

## 2020-05-03 DIAGNOSIS — Z95828 Presence of other vascular implants and grafts: Secondary | ICD-10-CM

## 2020-05-03 DIAGNOSIS — N183 Chronic kidney disease, stage 3 unspecified: Secondary | ICD-10-CM

## 2020-05-03 LAB — CA 125: Cancer Antigen (CA) 125: 1921 U/mL — ABNORMAL HIGH (ref 0.0–38.1)

## 2020-05-03 MED ORDER — SODIUM CHLORIDE 0.9% FLUSH
10.0000 mL | INTRAVENOUS | Status: DC | PRN
  Administered 2020-05-03: 10 mL
  Filled 2020-05-03: qty 10

## 2020-05-03 MED ORDER — SODIUM CHLORIDE 0.9 % IV SOLN
Freq: Once | INTRAVENOUS | Status: AC
  Filled 2020-05-03: qty 250

## 2020-05-03 MED ORDER — SODIUM CHLORIDE 0.9 % IV SOLN
87.5000 mg/m2 | Freq: Once | INTRAVENOUS | Status: AC
  Administered 2020-05-03: 150 mg via INTRAVENOUS
  Filled 2020-05-03: qty 25

## 2020-05-03 MED ORDER — SODIUM CHLORIDE 0.9 % IV SOLN
150.0000 mg | Freq: Once | INTRAVENOUS | Status: AC
  Administered 2020-05-03: 150 mg via INTRAVENOUS
  Filled 2020-05-03: qty 150

## 2020-05-03 MED ORDER — DIPHENHYDRAMINE HCL 50 MG/ML IJ SOLN
INTRAMUSCULAR | Status: AC
  Filled 2020-05-03: qty 1

## 2020-05-03 MED ORDER — PALONOSETRON HCL INJECTION 0.25 MG/5ML
0.2500 mg | Freq: Once | INTRAVENOUS | Status: AC
  Administered 2020-05-03: 0.25 mg via INTRAVENOUS

## 2020-05-03 MED ORDER — SODIUM CHLORIDE 0.9 % IV SOLN
10.0000 mg | Freq: Once | INTRAVENOUS | Status: AC
  Administered 2020-05-03: 10 mg via INTRAVENOUS
  Filled 2020-05-03: qty 10

## 2020-05-03 MED ORDER — PALONOSETRON HCL INJECTION 0.25 MG/5ML
INTRAVENOUS | Status: AC
  Filled 2020-05-03: qty 5

## 2020-05-03 MED ORDER — SODIUM CHLORIDE 0.9 % IV SOLN
262.2000 mg | Freq: Once | INTRAVENOUS | Status: AC
  Administered 2020-05-03: 260 mg via INTRAVENOUS
  Filled 2020-05-03: qty 26

## 2020-05-03 MED ORDER — HEPARIN SOD (PORK) LOCK FLUSH 100 UNIT/ML IV SOLN
500.0000 [IU] | Freq: Once | INTRAVENOUS | Status: AC | PRN
  Administered 2020-05-03: 500 [IU]
  Filled 2020-05-03: qty 5

## 2020-05-03 MED ORDER — DIPHENHYDRAMINE HCL 50 MG/ML IJ SOLN
50.0000 mg | Freq: Once | INTRAMUSCULAR | Status: AC
  Administered 2020-05-03: 50 mg via INTRAVENOUS

## 2020-05-03 MED ORDER — FAMOTIDINE IN NACL 20-0.9 MG/50ML-% IV SOLN
20.0000 mg | Freq: Once | INTRAVENOUS | Status: AC
  Administered 2020-05-03: 20 mg via INTRAVENOUS

## 2020-05-03 MED ORDER — FAMOTIDINE IN NACL 20-0.9 MG/50ML-% IV SOLN
INTRAVENOUS | Status: AC
  Filled 2020-05-03: qty 50

## 2020-05-03 NOTE — Assessment & Plan Note (Signed)
She has painful neuropathy We will proceed with reduced dose chemotherapy as above

## 2020-05-03 NOTE — Progress Notes (Signed)
Met with patient and spouse at registration to introduce myself as Arboriculturist and to offer available resources.  Discussed one-time $1000 Radio broadcast assistant to assist with personal expenses while going through treatment.  Gave her my card if interested in applying and for any additional financial questions or concerns.

## 2020-05-03 NOTE — Assessment & Plan Note (Signed)
She has acute on chronic renal failure She admits to reduce oral fluid intake In addition to reduced dose chemotherapy above, I recommend IV fluid support today, tomorrow and Saturday I plan to see her again next week for further follow-up If her renal function does not improve, I will order further imaging study to rule out hydronephrosis Her diuretic therapy has been discontinued

## 2020-05-03 NOTE — Progress Notes (Signed)
Patient states their household is over the income for the grant.  Also discussed possible available copay assistance through foundations which may help with treatment-related cost after insurance has paid their portion. Advised patient what would be needed to apply and she may give me a call if interested in applying. She verbalized understanding.

## 2020-05-03 NOTE — Progress Notes (Signed)
[facility] OFF[facility] PROGRESS NOTE  Patient Care Team: [name], MD as PCP - Kinnie Scales, OD as Referring Physician (Optometry) [name], MD as Consulting Physician (Dermatology) Latanya Maudlin, MD as Consulting Physician (Orthopedic Surgery) Inda Castle, MD (Inactive) as Consulting Physician (Gastroenterology) Jacqulyn Liner, RN as Oncology Nurse Navigator (Oncology) Heath Lark, MD as Consulting Physician (Hematology and Oncology)  ASSESSMENT & PLAN:  Ovarian cancer [facility]) I am extremely concerned about her sudden sign of acute renal failure I do not want to delay initiation of chemotherapy She does admit that she has reduced oral intake and struggle with adequate oral fluid hydration We will proceed with chemotherapy as scheduled without further work-up I plan to prescribe reduced dose carboplatin based on her renal function and 50% dose adjustment of Taxol I will see her early next week to repeat blood work and toxicity review  CKD stage 3 due to type 2 diabetes mellitus ([place]) She has acute on chronic renal failure She admits to reduce oral fluid intake In addition to reduced dose chemotherapy above, I recommend IV fluid support today, tomorrow and Saturday I plan to see her again next week for further follow-up If her renal function does not improve, I will order further imaging study to rule out hydronephrosis Her diuretic therapy has been discontinued  Diabetic neuropathy, painful ([place]) She has painful neuropathy We will proceed with reduced dose chemotherapy as above  Anemia in neoplastic disease She has multifactorial anemia, likely anemia chronic kidney disease and untreated cancer She is not symptomatic Observe for now She does not need transfusion support today  Leg edema, right She has stable edema secondary to compression of abdominal structure from her disease She will continue leg elevation I do not recommend diuretic  therapy   Orders Placed This Encounter  Procedures  . Sample to Blood Bank    Standing Status:   Standing    Number of Occurrences:   33    Standing Expiration Date:   05/03/2021    All questions were answered. The patient knows to call the clinic with any problems, questions or concerns. The total time spent in the appointment was 40 minutes encounter with patients including review of chart and various tests results, discussions about plan of care and coordination of care plan   Heath Lark, MD 05/03/2020 5:41 PM  INTERVAL HISTORY: Please see below for problem oriented charting. She is seen urgently before initiation of chemotherapy She is found to have signs of acute renal failure from blood work recently She has uneventful port placement She admits that she is not drinking enough fluid Her leg edema is unchanged She denies nausea or recent changes in bowel habits  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[place])  02/26/2016 Initial Diagnosis   Left breast biopsy 11:00 position: invasive ductal carcinoma with DCIS, ER 90%, PR 10%, HER-2 negative, Ki-67 30%, grade 2, 2.2 cm palpable lesion T2 N0 stage II a clinical stage   03/18/2016 Surgery   Left lumpectomy: Invasive ductal carcinoma, grade 2, 6.3 cm, with high-grade DCIS, margins negative, 0/4 lymph nodes negative, ER 90%, via 10%, HER-2 negative ratio 0.97, Ki-67 30%, T3 N0 stage IIB   03/25/2016 Procedure   Genetic testing is negative for pathogenic mutations within any of the 20 Genes on the breast/ovarian cancer panel   04/04/2016 Oncotype testing   Oncotype DX recurrence score 37, 25% 10 year distant risk  of recurrence   04/17/2016 - 07/31/2016 Chemotherapy   Adjuvant chemotherapy with dose dense Adriamycin and Cytoxan followed by Abraxane weekly 8 ( discontinued due to neuropathy)   09/01/2016 - 09/26/2016 Radiation Therapy   Adj XRT 1) Left breast: 42.5 Gy in  17 fractions. 2) Left breast boost: 7.5 Gy in 3 fractions.   12/02/2016 -  Anti-estrogen oral therapy   Anastrozole 1 mg daily   Ovarian cancer ([place])  04/03/2020 Imaging   US pelvis Complex cystic and solid mass in LEFT adnexa 12.5 cm diameter question cystic ovarian neoplasm; recommend correlation with serum tumor markers and further evaluation by MR imaging with and without contrast.     04/04/2020 Imaging   US venous Doppler No evidence of deep venous thrombosis in the right lower extremity. Left common femoral vein also patent.   04/15/2020 Imaging   MRI pelvis 1. Large complex solid and cystic mass arising from the right adnexa measuring 10.8 by 11.5 by 11.1 cm. This has an aggressive appearance with extensive enhancing mural soft tissue components. Findings are highly suspicious for malignant ovarian neoplasm. 2. Extensive bilateral retroperitoneal and bilateral iliac adenopathy compatible with metastatic disease. 3. Signs of extensive peritoneal carcinomatosis including ascites, enhancement, thickening and nodularity of the peritoneal reflections, omental caking and bulky peritoneal nodularity. 4. Suspected serosal involvement of the dome of bladder with loss of normal fat plane. Cannot rule out mural invasion by tumor.   04/17/2020 Tumor Marker   Patient's tumor was tested for the following markers: CA-125 Results of the tumor marker test revealed [id number].   04/20/2020 Cancer Staging   Staging form: Ovary, Fallopian Tube, and Primary Peritoneal Carcinoma, AJCC 8th Edition - Clinical: FIGO Stage IIIC (cT3, cN1, cM0) - Signed by Heath Lark, MD on 04/20/2020   04/23/2020 Imaging   1. Redemonstrated dominant mixed solid and cystic mass arising from the vicinity of the right ovary measuring at least 11.5 x 10.7 cm, not significantly changed compared to prior MR, and consistent with primary ovarian malignancy.  2. Numerous bulky retroperitoneal, bilateral iliac, and pelvic sidewall lymph  nodes. 3. Moderate volume ascites throughout the abdomen and pelvis with subtle thickening and nodularity throughout the peritoneum, and extensive bulky nodular metastatic disease of the omentum. 4. Constellation of findings is consistent with advanced nodal and peritoneal metastatic disease. 5. There are prominent subcentimeter epicardial lymph nodes, nonspecific although suspicious for nodal metastatic disease. No definite nodal metastatic disease in the chest. Attention on follow-up. 6. There are multiple small subpleural nodules at the right lung base overlying the diaphragm, measuring up to 7 mm. These are generally nonspecific and less favored to represent pulmonary metastatic disease given distribution. Attention on follow-up. 7. Somewhat coarse contour of the liver, suggestive of cirrhosis, although out without overt morphologic stigmata. 8. Aortic Atherosclerosis (ICD10-I70.0).     04/24/2020 Procedure   Successful placement of a right internal jugular approach power injectable Port-A-Cath. The catheter is ready for immediate use.     05/02/2020 Tumor Marker   Patient's tumor was tested for the following markers: CA-125 Results of the tumor marker test revealed [id number]     REVIEW OF SYSTEMS:   Constitutional: Denies fevers, chills or abnormal weight loss Eyes: Denies blurriness of vision Ears, nose, mouth, throat, and face: Denies mucositis or sore throat Respiratory: Denies cough, dyspnea or wheezes Cardiovascular: Denies palpitation, chest discomfort  Gastrointestinal:  Denies nausea, heartburn or change in bowel habits Skin: Denies abnormal skin rashes Lymphatics: Denies new lymphadenopathy or easy bruising  Neurological:Denies numbness, tingling or new weaknesses Behavioral/Psych: Mood is stable, no new changes  All other systems were reviewed with the patient and are negative.  I have reviewed the past medical history, past surgical history, social history and family history  with the patient and they are unchanged from previous note.  ALLERGIES:  is allergic to keflex [cephalexin]; meloxicam; prednisone; premarin [estrogens conjugated]; and trovan [alatrofloxacin].  MEDICATIONS:  Current Outpatient Medications  Medication Sig Dispense Refill  . acetaminophen (TYLENOL) 325 MG tablet Take 325 mg by mouth every 6 (six) hours as needed for headache.    . allopurinol (ZYLOPRIM) 300 MG tablet Take 1 tablet Daily to Prevent Gout (Patient taking differently: Take 300 mg by mouth daily. ) 90 tablet 3  . Cholecalciferol (VITAMIN D3) 5000 units CAPS Take 5,000 Units by mouth daily.    Marland Kitchen dexamethasone (DECADRON) 4 MG tablet Take 2 tabs at the night before and 2 tab the morning of chemotherapy, every 3 weeks, by mouth 36 tablet 5  . ezetimibe (ZETIA) 10 MG tablet Take 1 tablet Daily for Cholesterol (Patient taking differently: Take 10 mg by mouth daily. ) 90 tablet 3  . gabapentin (NEURONTIN) 300 MG capsule Take 1 capsule 3 to 4  x /day as needed for pain (Patient taking differently: Take 300 mg by mouth See admin instructions. Take 300 mg 3 times daily may take a 4th 300 mg dose as needed for pain) 360 capsule 3  . glucose blood (FREESTYLE TEST STRIPS) test strip Check Blood Sugar Daily as directed (Dx: E11.29) 100 strip 3  . Lancets (FREESTYLE) lancets CHECK BLOOD SUGAR DAILY AS DIRECTED 100 each 3  . lidocaine-prilocaine (EMLA) cream Apply to affected area once (Patient taking differently: Apply 1 application topically daily as needed (port access). ) 30 g 3  . loratadine (CLARITIN) 10 MG tablet Take 10 mg by mouth daily as needed for allergies.    . Magnesium ([employer] MAGNESIUM) 250 MG TABS Take 500 mg by mouth 2 (two) times daily.    . metFORMIN (GLUCOPHAGE-XR) 500 MG 24 hr tablet Take 2 tablet 2 times a day for blood sugar. (Patient taking differently: Take 500 mg by mouth 2 (two) times daily. ) 180 tablet 1  . metoprolol succinate (TOPROL-XL) 25 MG 24 hr tablet Take 1 tablet  Daily for BP (Patient taking differently: Take 25 mg by mouth daily. ) 90 tablet 3  . mirabegron ER (MYRBETRIQ) 50 MG TB24 tablet Take 1 tablet (50 mg total) by mouth daily. 30 tablet 2  . ondansetron (ZOFRAN) 8 MG tablet Take 1 tablet (8 mg total) by mouth 2 (two) times daily as needed for refractory nausea / vomiting. Start on day 3 after carboplatin chemo. 30 tablet 1  . prochlorperazine (COMPAZINE) 10 MG tablet Take 1 tablet (10 mg total) by mouth every 6 (six) hours as needed (Nausea or vomiting). 30 tablet 1  . rosuvastatin (CRESTOR) 5 MG tablet Take 1 tablet 3 x /week on - Mon Wed Fri - for Cholesterol (Patient taking differently: Take 5 mg by mouth every Monday, Wednesday, and Friday. ) 90 tablet 0  . vitamin C (ASCORBIC ACID) 500 MG tablet Take 500 mg by mouth every evening.      No current facility-administered medications for this visit.   Facility-Administered Medications Ordered in Other Visits  Medication Dose Route Frequency Provider Last Rate Last Admin  . sodium chloride flush (NS) 0.9 % injection 10 mL  10 mL Intracatheter PRN Heath Lark, MD  10 mL at 05/03/20 1614    PHYSICAL EXAMINATION: ECOG PERFORMANCE STATUS: 1 - Symptomatic but completely ambulatory  Vitals:   05/03/20 0914  BP: 120/64  Pulse: 87  Resp: 17  Temp: 97.6 F (36.4 C)  SpO2: 99%   Filed Weights   05/03/20 0914  Weight: 148 lb 12.8 oz (67.5 kg)    GENERAL:alert, no distress and comfortable HEART: She has persistent right lower extremity edema NEURO: alert & oriented x 3 with fluent speech, no focal motor/sensory deficits  LABORATORY DATA:  I have reviewed the data as listed    Component Value Date/Time   NA 140 05/02/2020 1408   NA 141 11/06/2016 1102   K 4.2 05/02/2020 1408   K 3.9 11/06/2016 1102   CL 106 05/02/2020 1408   CO2 22 05/02/2020 1408   CO2 27 11/06/2016 1102   GLUCOSE 101 (H) 05/02/2020 1408   GLUCOSE 130 11/06/2016 1102   BUN 49 (H) 05/02/2020 1408   BUN 18.0  11/06/2016 1102   CREATININE 2.45 (H) 05/02/2020 1408   CREATININE 1.35 (H) 04/11/2020 1027   CREATININE 1.3 (H) 11/06/2016 1102   CALCIUM 9.4 05/02/2020 1408   CALCIUM 10.2 11/06/2016 1102   PROT 6.5 05/02/2020 1408   PROT 7.1 11/06/2016 1102   ALBUMIN 3.1 (L) 05/02/2020 1408   ALBUMIN 3.7 11/06/2016 1102   AST 27 05/02/2020 1408   AST 52 (H) 11/06/2016 1102   ALT 8 05/02/2020 1408   ALT 58 (H) 11/06/2016 1102   ALKPHOS 89 05/02/2020 1408   ALKPHOS 159 (H) 11/06/2016 1102   BILITOT 0.3 05/02/2020 1408   BILITOT 0.53 11/06/2016 1102   GFRNONAA 18 (L) 05/02/2020 1408   GFRNONAA 36 (L) 04/11/2020 1027   GFRAA 21 (L) 05/02/2020 1408   GFRAA 42 (L) 04/11/2020 1027    No results found for: SPEP, UPEP  Lab Results  Component Value Date   WBC 8.3 05/02/2020   NEUTROABS 5.7 05/02/2020   HGB 9.7 (L) 05/02/2020   HCT 29.9 (L) 05/02/2020   MCV 88.2 05/02/2020   PLT 284 05/02/2020      Chemistry      Component Value Date/Time   NA 140 05/02/2020 1408   NA 141 11/06/2016 1102   K 4.2 05/02/2020 1408   K 3.9 11/06/2016 1102   CL 106 05/02/2020 1408   CO2 22 05/02/2020 1408   CO2 27 11/06/2016 1102   BUN 49 (H) 05/02/2020 1408   BUN 18.0 11/06/2016 1102   CREATININE 2.45 (H) 05/02/2020 1408   CREATININE 1.35 (H) 04/11/2020 1027   CREATININE 1.3 (H) 11/06/2016 1102      Component Value Date/Time   CALCIUM 9.4 05/02/2020 1408   CALCIUM 10.2 11/06/2016 1102   ALKPHOS 89 05/02/2020 1408   ALKPHOS 159 (H) 11/06/2016 1102   AST 27 05/02/2020 1408   AST 52 (H) 11/06/2016 1102   ALT 8 05/02/2020 1408   ALT 58 (H) 11/06/2016 1102   BILITOT 0.3 05/02/2020 1408   BILITOT 0.53 11/06/2016 1102       RADIOGRAPHIC STUDIES: I have personally reviewed the radiological images as listed and agreed with the findings in the report. CT CHEST W CONTRAST  Result Date: 04/23/2020 CLINICAL DATA:  Ovarian cancer staging EXAM: CT CHEST, ABDOMEN, AND PELVIS WITH CONTRAST TECHNIQUE:  Multidetector CT imaging of the chest, abdomen and pelvis was performed following the standard protocol during bolus administration of intravenous contrast. CONTRAST:  25m OMNIPAQUE IOHEXOL 300 MG/ML SOLN, additional oral  enteric contrast COMPARISON:  MR pelvis, 04/15/2020 FINDINGS: CT CHEST FINDINGS Cardiovascular: Aortic atherosclerosis. Normal heart size. No pericardial effusion. Mediastinum/Nodes: There are prominent subcentimeter epicardial lymph nodes adjacent to the right atrium (series 2, image 45). No other enlarged mediastinal, hilar, or axillary lymph nodes. Thyroid gland, trachea, and esophagus demonstrate no significant findings. Lungs/Pleura: There are multiple small subpleural nodules at the right lung base overlying the diaphragm, measuring up to 7 mm (series 6, image 102, no pleural effusion or pneumothorax. Musculoskeletal: No chest wall mass or suspicious bone lesions identified. Postoperative findings of left lumpectomy and axillary lymph node dissection. CT ABDOMEN PELVIS FINDINGS Hepatobiliary: Somewhat coarse contour of the liver. No gallstones, gallbladder wall thickening, or biliary dilatation. Pancreas: Unremarkable. No pancreatic ductal dilatation or surrounding inflammatory changes. Spleen: Normal in size without significant abnormality. Adrenals/Urinary Tract: Adrenal glands are unremarkable. Kidneys are normal, without renal calculi, solid lesion, or hydronephrosis. Bladder is unremarkable. Stomach/Bowel: Stomach is within normal limits. Appendix appears normal. No evidence of bowel wall thickening, distention, or inflammatory changes. Vascular/Lymphatic: No significant vascular findings are present. Numerous bulky retroperitoneal, bilateral iliac, and pelvic sidewall lymph nodes, an index aortocaval node measuring 2.7 x 2.1 cm (series 2, image 78). Reproductive: Redemonstrated dominant mixed solid and cystic mass arising from the vicinity of the right ovary measuring at least 11.5 x  10.7 cm, not significantly changed compared to prior MR (series 2, image 104). Status post hysterectomy. Other: No abdominal wall hernia or abnormality. Moderate volume ascites throughout the abdomen and pelvis with subtle thickening and nodularity throughout the peritoneum, and extensive bulky nodular metastatic disease of the omentum (series 2, image 102). Musculoskeletal: No acute or significant osseous findings. IMPRESSION: 1. Redemonstrated dominant mixed solid and cystic mass arising from the vicinity of the right ovary measuring at least 11.5 x 10.7 cm, not significantly changed compared to prior MR, and consistent with primary ovarian malignancy. 2. Numerous bulky retroperitoneal, bilateral iliac, and pelvic sidewall lymph nodes. 3. Moderate volume ascites throughout the abdomen and pelvis with subtle thickening and nodularity throughout the peritoneum, and extensive bulky nodular metastatic disease of the omentum. 4. Constellation of findings is consistent with advanced nodal and peritoneal metastatic disease. 5. There are prominent subcentimeter epicardial lymph nodes, nonspecific although suspicious for nodal metastatic disease. No definite nodal metastatic disease in the chest. Attention on follow-up. 6. There are multiple small subpleural nodules at the right lung base overlying the diaphragm, measuring up to 7 mm. These are generally nonspecific and less favored to represent pulmonary metastatic disease given distribution. Attention on follow-up. 7. Somewhat coarse contour of the liver, suggestive of cirrhosis, although out without overt morphologic stigmata. 8. Aortic Atherosclerosis (ICD10-I70.0). Electronically Signed   By: Eddie Candle M.D.   On: 04/23/2020 08:43   MR Pelvis W Wo Contrast  Result Date: 04/15/2020 CLINICAL DATA:  Staging ovarian cancer. EXAM: MRI PELVIS WITHOUT AND WITH CONTRAST TECHNIQUE: Multiplanar multisequence MR imaging of the pelvis was performed both before and after  administration of intravenous contrast. CONTRAST:  20m MULTIHANCE GADOBENATE DIMEGLUMINE 529 MG/ML IV SOLN COMPARISON:  Ultrasound dated 04/03/2020 FINDINGS: Urinary Tract: Large pelvic mass and bulky peritoneal disease has mass effect upon the dome of bladder. Loss of fat plane along the dome of bladder and peritoneal lesions noted. Cannot rule out serosal involvement of the bladder dome. Bowel: Imaged portions of the colon and rectum. No pathologically enlarged small or large bowel loops identified at this time to suggest a bowel obstruction. Vascular/Lymphatic: No significant vascular  abnormality identified. Extensive bilateral common iliac and external iliac adenopathy is identified. Left common iliac lymph node measures 1.7 cm, image number 1/9 Right common iliac lymph node measures 1.3 cm, image 5/9. Right pelvic sidewall lymph node measures 1.7 cm, image 82/9. Left external iliac lymph node measures 1.2 cm, image 52/9 Bulky retroperitoneal adenopathy is identified within the abdomen as seen on the postcontrast coronal images. This includes a left retroperitoneal node with a short axis of 1.7 cm, image 69/11. Reproductive: Uterus: Surgically absent. Right ovary: Not seen. Large complex solid and cystic mass with enhancing soft tissue components is identified within pelvis in appears to arise from the right adnexa. This measures 10.8 by 11.5 by 11.1 cm, image 51/9 and image 22/3. This has an aggressive appearance with extensive enhancing mural soft tissue. Highly concerning for malignant ovarian neoplasm. Left ovary: Measures 1.5 x 3.1 x 3.6 cm. This contains 3 simple appearing unilocular cysts which measure up to 1.9 cm, image 77/9 and image 15/3. Other:  Ascites is identified within the abdomen and pelvis. Bulky peritoneal disease with extensive caking of the omentum is identified compatible with peritoneal carcinomatosis. Omental tumor along the anterior midline of abdomen measures 3.5 x 2.2 cm, image 8/6.  Tumor implant along the right pericolic gutter measures 3.4 x 3.9 cm, image 23/3. There is diffuse enhancement and nodularity involving the peritoneal reflections. Musculoskeletal: No suspicious enhancing bone lesions. IMPRESSION: 1. Large complex solid and cystic mass arising from the right adnexa measuring 10.8 by 11.5 by 11.1 cm. This has an aggressive appearance with extensive enhancing mural soft tissue components. Findings are highly suspicious for malignant ovarian neoplasm. 2. Extensive bilateral retroperitoneal and bilateral iliac adenopathy compatible with metastatic disease. 3. Signs of extensive peritoneal carcinomatosis including ascites, enhancement, thickening and nodularity of the peritoneal reflections, omental caking and bulky peritoneal nodularity. 4. Suspected serosal involvement of the dome of bladder with loss of normal fat plane. Cannot rule out mural invasion by tumor. Electronically Signed   By: Kerby Moors M.D.   On: 04/15/2020 15:18   CT Abdomen Pelvis W Contrast  Result Date: 04/23/2020 CLINICAL DATA:  Ovarian cancer staging EXAM: CT CHEST, ABDOMEN, AND PELVIS WITH CONTRAST TECHNIQUE: Multidetector CT imaging of the chest, abdomen and pelvis was performed following the standard protocol during bolus administration of intravenous contrast. CONTRAST:  19m OMNIPAQUE IOHEXOL 300 MG/ML SOLN, additional oral enteric contrast COMPARISON:  MR pelvis, 04/15/2020 FINDINGS: CT CHEST FINDINGS Cardiovascular: Aortic atherosclerosis. Normal heart size. No pericardial effusion. Mediastinum/Nodes: There are prominent subcentimeter epicardial lymph nodes adjacent to the right atrium (series 2, image 45). No other enlarged mediastinal, hilar, or axillary lymph nodes. Thyroid gland, trachea, and esophagus demonstrate no significant findings. Lungs/Pleura: There are multiple small subpleural nodules at the right lung base overlying the diaphragm, measuring up to 7 mm (series 6, image 102, no pleural  effusion or pneumothorax. Musculoskeletal: No chest wall mass or suspicious bone lesions identified. Postoperative findings of left lumpectomy and axillary lymph node dissection. CT ABDOMEN PELVIS FINDINGS Hepatobiliary: Somewhat coarse contour of the liver. No gallstones, gallbladder wall thickening, or biliary dilatation. Pancreas: Unremarkable. No pancreatic ductal dilatation or surrounding inflammatory changes. Spleen: Normal in size without significant abnormality. Adrenals/Urinary Tract: Adrenal glands are unremarkable. Kidneys are normal, without renal calculi, solid lesion, or hydronephrosis. Bladder is unremarkable. Stomach/Bowel: Stomach is within normal limits. Appendix appears normal. No evidence of bowel wall thickening, distention, or inflammatory changes. Vascular/Lymphatic: No significant vascular findings are present. Numerous bulky retroperitoneal, bilateral iliac,  and pelvic sidewall lymph nodes, an index aortocaval node measuring 2.7 x 2.1 cm (series 2, image 78). Reproductive: Redemonstrated dominant mixed solid and cystic mass arising from the vicinity of the right ovary measuring at least 11.5 x 10.7 cm, not significantly changed compared to prior MR (series 2, image 104). Status post hysterectomy. Other: No abdominal wall hernia or abnormality. Moderate volume ascites throughout the abdomen and pelvis with subtle thickening and nodularity throughout the peritoneum, and extensive bulky nodular metastatic disease of the omentum (series 2, image 102). Musculoskeletal: No acute or significant osseous findings. IMPRESSION: 1. Redemonstrated dominant mixed solid and cystic mass arising from the vicinity of the right ovary measuring at least 11.5 x 10.7 cm, not significantly changed compared to prior MR, and consistent with primary ovarian malignancy. 2. Numerous bulky retroperitoneal, bilateral iliac, and pelvic sidewall lymph nodes. 3. Moderate volume ascites throughout the abdomen and pelvis with  subtle thickening and nodularity throughout the peritoneum, and extensive bulky nodular metastatic disease of the omentum. 4. Constellation of findings is consistent with advanced nodal and peritoneal metastatic disease. 5. There are prominent subcentimeter epicardial lymph nodes, nonspecific although suspicious for nodal metastatic disease. No definite nodal metastatic disease in the chest. Attention on follow-up. 6. There are multiple small subpleural nodules at the right lung base overlying the diaphragm, measuring up to 7 mm. These are generally nonspecific and less favored to represent pulmonary metastatic disease given distribution. Attention on follow-up. 7. Somewhat coarse contour of the liver, suggestive of cirrhosis, although out without overt morphologic stigmata. 8. Aortic Atherosclerosis (ICD10-I70.0). Electronically Signed   By: Eddie Candle M.D.   On: 04/23/2020 08:43   CT ASPIRATION  Result Date: 04/24/2020 INDICATION: History of breast cancer, now with findings worrisome for metastatic gynecologic malignancy. Please perform CT-guided biopsy of dominant omental mass/caking within the right mid hemiabdomen. Additionally, please perform ultrasound-guided paracentesis for diagnostic and therapeutic purposes. EXAM: 1. IMAGE GUIDED BIOPSY OF OMENTAL MASS WITHIN THE RIGHT MID HEMIABDOMEN 2. IMAGE GUIDED DIAGNOSTIC AND THERAPEUTIC PARACENTESIS. COMPARISON:  CT the chest, abdomen and pelvis-04/23/2020 MEDICATIONS: None. ANESTHESIA/SEDATION: Fentanyl 25 mcg IV; Versed 1 mg IV Sedation time: 27 minutes; The patient was continuously monitored during the procedure by the interventional radiology nurse under my direct supervision. CONTRAST:  None. COMPLICATIONS: None immediate. PROCEDURE: Informed consent was obtained from the patient following an explanation of the procedure, risks, benefits and alternatives. A time out was performed prior to the initiation of the procedure. The patient was positioned supine  on the CT table and a limited CT was performed for procedural planning demonstrating unchanged size and appearance of the mental for caking within the ventral aspect of the lower abdomen with dominant omental mass measuring approximately 4.5 x 4.0 cm (image 30, series 2). Note was also made of small amount of predominantly perihepatic intra-abdominal ascites. Both the dominant omental mass within ventral aspect the right mid hemiabdomen as well as the adjacent ascitic fluid were identified sonographically. Multiple ultrasound images were saved procedural documentation purposes. The procedures were planned. The operative sites were prepped and draped in the usual sterile fashion. Under direct ultrasound guidance, dominant perihepatic fluid collection was accessed with a 5 [place] Yueh sheath catheter needle and a diagnostic/therapeutic paracentesis was performed. Next, under direct ultrasound guidance, dominant omental mass within the ventral aspect of the right mid/lateral abdomen was targeted with a 17 gauge coaxial needle. Appropriate position was confirmed with CT imaging. Next, under direct ultrasound guidance, 6 core needle biopsies were obtained with  an 18 gauge core needle biopsy device. The coaxial needle was withdrawn superficial hemostasis was achieved with manual compression. The paracentesis was completed ultimately yielding 600 cc of bloody ascitic fluid. A representative sample was capped and sent to the laboratory for cytologic analysis. A limited postprocedural CT was negative for hemorrhage or additional complication. A dressing was placed. The patient tolerated the procedure well without immediate postprocedural complication. IMPRESSION: 1. Technically successful ultrasound and CT-guided core needle biopsy of dominant omental mass within the lateral ventral aspect of the right mid hemiabdomen. 2. Technically successful ultrasound and CT guided paracentesis yielding a total of 600 cc of bloody  ascitic fluid. A representative sample was capped and sent to the laboratory for cytologic analysis. PLAN: Patient was subsequently transferred to the interventional radiology suite and underwent Port a catheter placement. Electronically Signed   By: Sandi Mariscal M.D.   On: 04/24/2020 11:16   US Venous Img Lower Unilateral Right (DVT)  Result Date: 04/04/2020 CLINICAL DATA:  Right lower extremity edema EXAM: RIGHT LOWER EXTREMITY VENOUS DUPLEX ULTRASOUND TECHNIQUE: Gray-scale sonography with graded compression, as well as color Doppler and duplex ultrasound were performed to evaluate the right lower extremity deep venous system from the level of the common femoral vein and including the common femoral, femoral, profunda femoral, popliteal and calf veins including the posterior tibial, peroneal and gastrocnemius veins when visible. The superficial great saphenous vein was also interrogated. Spectral Doppler was utilized to evaluate flow at rest and with distal augmentation maneuvers in the common femoral, femoral and popliteal veins. COMPARISON:  None. FINDINGS: Contralateral Common Femoral Vein: Respiratory phasicity is normal and symmetric with the symptomatic side. No evidence of thrombus. Normal compressibility. Common Femoral Vein: No evidence of thrombus. Normal compressibility, respiratory phasicity and response to augmentation. Saphenofemoral Junction: No evidence of thrombus. Normal compressibility and flow on color Doppler imaging. Profunda Femoral Vein: No evidence of thrombus. Normal compressibility and flow on color Doppler imaging. Femoral Vein: No evidence of thrombus. Normal compressibility, respiratory phasicity and response to augmentation. Popliteal Vein: No evidence of thrombus. Normal compressibility, respiratory phasicity and response to augmentation. Calf Veins: No evidence of thrombus. Normal compressibility and flow on color Doppler imaging. Superficial Great Saphenous Vein: No evidence of  thrombus. Normal compressibility. Venous Reflux:  None. Other Findings:  None. IMPRESSION: No evidence of deep venous thrombosis in the right lower extremity. Left common femoral vein also patent. Electronically Signed   By: Lowella Grip III M.D.   On: 04/04/2020 10:02   CT BIOPSY  Result Date: 04/24/2020 INDICATION: History of breast cancer, now with findings worrisome for metastatic gynecologic malignancy. Please perform CT-guided biopsy of dominant omental mass/caking within the right mid hemiabdomen. Additionally, please perform ultrasound-guided paracentesis for diagnostic and therapeutic purposes. EXAM: 1. IMAGE GUIDED BIOPSY OF OMENTAL MASS WITHIN THE RIGHT MID HEMIABDOMEN 2. IMAGE GUIDED DIAGNOSTIC AND THERAPEUTIC PARACENTESIS. COMPARISON:  CT the chest, abdomen and pelvis-04/23/2020 MEDICATIONS: None. ANESTHESIA/SEDATION: Fentanyl 25 mcg IV; Versed 1 mg IV Sedation time: 27 minutes; The patient was continuously monitored during the procedure by the interventional radiology nurse under my direct supervision. CONTRAST:  None. COMPLICATIONS: None immediate. PROCEDURE: Informed consent was obtained from the patient following an explanation of the procedure, risks, benefits and alternatives. A time out was performed prior to the initiation of the procedure. The patient was positioned supine on the CT table and a limited CT was performed for procedural planning demonstrating unchanged size and appearance of the mental for caking within the ventral  aspect of the lower abdomen with dominant omental mass measuring approximately 4.5 x 4.0 cm (image 30, series 2). Note was also made of small amount of predominantly perihepatic intra-abdominal ascites. Both the dominant omental mass within ventral aspect the right mid hemiabdomen as well as the adjacent ascitic fluid were identified sonographically. Multiple ultrasound images were saved procedural documentation purposes. The procedures were planned. The  operative sites were prepped and draped in the usual sterile fashion. Under direct ultrasound guidance, dominant perihepatic fluid collection was accessed with a 5 [place] Yueh sheath catheter needle and a diagnostic/therapeutic paracentesis was performed. Next, under direct ultrasound guidance, dominant omental mass within the ventral aspect of the right mid/lateral abdomen was targeted with a 17 gauge coaxial needle. Appropriate position was confirmed with CT imaging. Next, under direct ultrasound guidance, 6 core needle biopsies were obtained with an 18 gauge core needle biopsy device. The coaxial needle was withdrawn superficial hemostasis was achieved with manual compression. The paracentesis was completed ultimately yielding 600 cc of bloody ascitic fluid. A representative sample was capped and sent to the laboratory for cytologic analysis. A limited postprocedural CT was negative for hemorrhage or additional complication. A dressing was placed. The patient tolerated the procedure well without immediate postprocedural complication. IMPRESSION: 1. Technically successful ultrasound and CT-guided core needle biopsy of dominant omental mass within the lateral ventral aspect of the right mid hemiabdomen. 2. Technically successful ultrasound and CT guided paracentesis yielding a total of 600 cc of bloody ascitic fluid. A representative sample was capped and sent to the laboratory for cytologic analysis. PLAN: Patient was subsequently transferred to the interventional radiology suite and underwent Port a catheter placement. Electronically Signed   By: Sandi Mariscal M.D.   On: 04/24/2020 11:16   [place] ASCITES (ABDOMEN LIMITED)  Result Date: 04/23/2020 CLINICAL DATA:  Ovarian mass, peritoneal masses and ascites. EXAM: LIMITED ABDOMEN ULTRASOUND FOR ASCITES TECHNIQUE: Limited ultrasound survey for ascites was performed in all four abdominal quadrants. COMPARISON:  CT of the abdomen and pelvis on 04/23/2020 FINDINGS:  Survey of the peritoneal cavity shows relatively small amount of ascites scattered throughout the peritoneal cavity. A large enough pocket was not present to allow for safe paracentesis today. The patient is scheduled for a CT-guided biopsy of peritoneal mass later this week. IMPRESSION: Relatively small amount of scattered ascites in the peritoneal cavity. A large enough volume of fluid was not present to allow for paracentesis today. Electronically Signed   By: Aletta Edouard M.D.   On: 04/23/2020 13:07   IR IMAGING GUIDED PORT INSERTION  Result Date: 04/24/2020 INDICATION: History of breast cancer, now with findings worrisome for metastatic gynecologic malignancy. Patient in need of durable intravenous access for chemotherapy administration. EXAM: IMPLANTED PORT A CATH PLACEMENT WITH ULTRASOUND AND FLUOROSCOPIC GUIDANCE COMPARISON:  Chest CT-04/23/2020 MEDICATIONS: Vancomycin 1 gm IV; The antibiotic was administered within an appropriate time interval prior to skin puncture. ANESTHESIA/SEDATION: Moderate (conscious) sedation was employed during this procedure. A total of Versed 1 mg and Fentanyl 25 mcg was administered intravenously. Moderate Sedation Time: 23 minutes. The patient's level of consciousness and vital signs were monitored continuously by radiology nursing throughout the procedure under my direct supervision. CONTRAST:  None FLUOROSCOPY TIME:  24 seconds (1 mGy) COMPLICATIONS: None immediate. PROCEDURE: The procedure, risks, benefits, and alternatives were explained to the patient. Questions regarding the procedure were encouraged and answered. The patient understands and consents to the procedure. The right neck and chest were prepped with chlorhexidine in a sterile  fashion, and a sterile drape was applied covering the operative field. Maximum barrier sterile technique with sterile gowns and gloves were used for the procedure. A timeout was performed prior to the initiation of the procedure.  Local anesthesia was provided with 1% lidocaine with epinephrine. After creating a small venotomy incision, a micropuncture kit was utilized to access the internal jugular vein. Real-time ultrasound guidance was utilized for vascular access including the acquisition of a permanent ultrasound image documenting patency of the accessed vessel. The microwire was utilized to measure appropriate catheter length. A subcutaneous port pocket was then created along the upper chest wall utilizing a combination of sharp and blunt dissection. The pocket was irrigated with sterile saline. A single lumen "Slim" sized power injectable port was chosen for placement. The 8 Fr catheter was tunneled from the port pocket site to the venotomy incision. The port was placed in the pocket. The external catheter was trimmed to appropriate length. At the venotomy, an 8 Fr peel-away sheath was placed over a guidewire under fluoroscopic guidance. The catheter was then placed through the sheath and the sheath was removed. Final catheter positioning was confirmed and documented with a fluoroscopic spot radiograph. The port was accessed with a Huber needle, aspirated and flushed with heparinized saline. The venotomy site was closed with an interrupted 4-0 Vicryl suture. The port pocket incision was closed with interrupted 2-0 Vicryl suture. The skin was opposed with a running subcuticular 4-0 Vicryl suture. Dermabond and Steri-strips were applied to both incisions. Dressings were applied. The patient tolerated the procedure well without immediate post procedural complication. FINDINGS: After catheter placement, the tip lies within the superior cavoatrial junction. The catheter aspirates and flushes normally and is ready for immediate use. IMPRESSION: Successful placement of a right internal jugular approach power injectable Port-A-Cath. The catheter is ready for immediate use. Electronically Signed   By: Sandi Mariscal M.D.   On: 04/24/2020 11:10

## 2020-05-03 NOTE — Assessment & Plan Note (Signed)
She has stable edema secondary to compression of abdominal structure from her disease She will continue leg elevation I do not recommend diuretic therapy

## 2020-05-03 NOTE — Assessment & Plan Note (Signed)
She has multifactorial anemia, likely anemia chronic kidney disease and untreated cancer She is not symptomatic Observe for now She does not need transfusion support today

## 2020-05-03 NOTE — Assessment & Plan Note (Signed)
I am extremely concerned about her sudden sign of acute renal failure I do not want to delay initiation of chemotherapy She does admit that she has reduced oral intake and struggle with adequate oral fluid hydration We will proceed with chemotherapy as scheduled without further work-up I plan to prescribe reduced dose carboplatin based on her renal function and 50% dose adjustment of Taxol I will see her early next week to repeat blood work and toxicity review

## 2020-05-03 NOTE — Telephone Encounter (Signed)
Called Kayla Baker and scheduled apt to see Dr. [name] today at 9 am.

## 2020-05-03 NOTE — Progress Notes (Signed)
Met with Kayla Baker in the infusion room for her first infusion.  Advised her to call if she has any questions or needs.

## 2020-05-03 NOTE — Patient Instructions (Signed)
[facility] Discharge Instructions for Patients Receiving Chemotherapy  Today you received the following chemotherapy agents taxol; carboplatin  To help prevent nausea and vomiting after your treatment, we encourage you to take your nausea medication as directed   If you develop nausea and vomiting that is not controlled by your nausea medication, call the clinic.   BELOW ARE SYMPTOMS THAT SHOULD BE REPORTED IMMEDIATELY:  *FEVER GREATER THAN 100.5 F  *CHILLS WITH OR WITHOUT FEVER  NAUSEA AND VOMITING THAT IS NOT CONTROLLED WITH YOUR NAUSEA MEDICATION  *UNUSUAL SHORTNESS OF BREATH  *UNUSUAL BRUISING OR BLEEDING  TENDERNESS IN MOUTH AND THROAT WITH OR WITHOUT PRESENCE OF ULCERS  *URINARY PROBLEMS  *BOWEL PROBLEMS  UNUSUAL RASH Items with * indicate a potential emergency and should be followed up as soon as possible.  Feel free to call the clinic should you have any questions or concerns. The clinic phone number is [phone].  Please show the [facility] at check-in to the Emergency Department and triage nurse.  Paclitaxel injection What is this medicine? PACLITAXEL (PAK li TAX el) is a chemotherapy drug. It targets fast dividing cells, like cancer cells, and causes these cells to die. This medicine is used to treat ovarian cancer, breast cancer, lung cancer, Kaposi's sarcoma, and other cancers. This medicine may be used for other purposes; ask your health care provider or pharmacist if you have questions. COMMON BRAND NAME(S): Onxol, Taxol What should I tell my health care provider before I take this medicine? They need to know if you have any of these conditions:  history of irregular heartbeat  liver disease  low blood counts, like low white cell, platelet, or red cell counts  lung or breathing disease, like asthma  tingling of the fingers or toes, or other nerve disorder  an unusual or allergic reaction to paclitaxel, alcohol,  polyoxyethylated castor oil, other chemotherapy, other medicines, foods, dyes, or preservatives  pregnant or trying to get pregnant  breast-feeding How should I use this medicine? This drug is given as an infusion into a vein. It is administered in a hospital or clinic by a specially trained health care professional. Talk to your pediatrician regarding the use of this medicine in children. Special care may be needed. Overdosage: If you think you have taken too much of this medicine contact a poison control center or emergency room at once. NOTE: This medicine is only for you. Do not share this medicine with others. What if I miss a dose? It is important not to miss your dose. Call your doctor or health care professional if you are unable to keep an appointment. What may interact with this medicine? Do not take this medicine with any of the following medications:  disulfiram  metronidazole This medicine may also interact with the following medications:  antiviral medicines for hepatitis, HIV or AIDS  certain antibiotics like erythromycin and clarithromycin  certain medicines for fungal infections like ketoconazole and itraconazole  certain medicines for seizures like carbamazepine, phenobarbital, phenytoin  gemfibrozil  nefazodone  rifampin  St. John's wort This list may not describe all possible interactions. Give your health care provider a list of all the medicines, herbs, non-prescription drugs, or dietary supplements you use. Also tell them if you smoke, drink alcohol, or use illegal drugs. Some items may interact with your medicine. What should I watch for while using this medicine? Your condition will be monitored carefully while you are receiving this medicine. You will need important blood  work done while you are taking this medicine. This medicine can cause serious allergic reactions. To reduce your risk you will need to take other medicine(s) before treatment with this  medicine. If you experience allergic reactions like skin rash, itching or hives, swelling of the face, lips, or tongue, tell your doctor or health care professional right away. In some cases, you may be given additional medicines to help with side effects. Follow all directions for their use. This drug may make you feel generally unwell. This is not uncommon, as chemotherapy can affect healthy cells as well as cancer cells. Report any side effects. Continue your course of treatment even though you feel ill unless your doctor tells you to stop. Call your doctor or health care professional for advice if you get a fever, chills or sore throat, or other symptoms of a cold or flu. Do not treat yourself. This drug decreases your body's ability to fight infections. Try to avoid being around people who are sick. This medicine may increase your risk to bruise or bleed. Call your doctor or health care professional if you notice any unusual bleeding. Be careful brushing and flossing your teeth or using a toothpick because you may get an infection or bleed more easily. If you have any dental work done, tell your dentist you are receiving this medicine. Avoid taking products that contain aspirin, acetaminophen, ibuprofen, naproxen, or ketoprofen unless instructed by your doctor. These medicines may hide a fever. Do not become pregnant while taking this medicine. Women should inform their doctor if they wish to become pregnant or think they might be pregnant. There is a potential for serious side effects to an unborn child. Talk to your health care professional or pharmacist for more information. Do not breast-feed an infant while taking this medicine. Men are advised not to father a child while receiving this medicine. This product may contain alcohol. Ask your pharmacist or healthcare provider if this medicine contains alcohol. Be sure to tell all healthcare providers you are taking this medicine. Certain medicines,  like metronidazole and disulfiram, can cause an unpleasant reaction when taken with alcohol. The reaction includes flushing, headache, nausea, vomiting, sweating, and increased thirst. The reaction can last from 30 minutes to several hours. What side effects may I notice from receiving this medicine? Side effects that you should report to your doctor or health care professional as soon as possible:  allergic reactions like skin rash, itching or hives, swelling of the face, lips, or tongue  breathing problems  changes in vision  fast, irregular heartbeat  high or low blood pressure  mouth sores  pain, tingling, numbness in the hands or feet  signs of decreased platelets or bleeding - bruising, pinpoint red spots on the skin, black, tarry stools, blood in the urine  signs of decreased red blood cells - unusually weak or tired, feeling faint or lightheaded, falls  signs of infection - fever or chills, cough, sore throat, pain or difficulty passing urine  signs and symptoms of liver injury like dark yellow or brown urine; general ill feeling or flu-like symptoms; light-colored stools; loss of appetite; nausea; right upper belly pain; unusually weak or tired; yellowing of the eyes or skin  swelling of the ankles, feet, hands  unusually slow heartbeat Side effects that usually do not require medical attention (report to your doctor or health care professional if they continue or are bothersome):  diarrhea  hair loss  loss of appetite  muscle or joint pain  nausea, vomiting  pain, redness, or irritation at site where injected  tiredness This list may not describe all possible side effects. Call your doctor for medical advice about side effects. You may report side effects to FDA at 1-800-FDA-1088. Where should I keep my medicine? This drug is given in a hospital or clinic and will not be stored at home. NOTE: This sheet is a summary. It may not cover all possible information.  If you have questions about this medicine, talk to your doctor, pharmacist, or health care provider.  2020 Elsevier/Gold Standard (2017-07-21 13:14:55)  Carboplatin injection What is this medicine? CARBOPLATIN (KAR boe pla tin) is a chemotherapy drug. It targets fast dividing cells, like cancer cells, and causes these cells to die. This medicine is used to treat ovarian cancer and many other cancers. This medicine may be used for other purposes; ask your health care provider or pharmacist if you have questions. COMMON BRAND NAME(S): Paraplatin What should I tell my health care provider before I take this medicine? They need to know if you have any of these conditions:  blood disorders  hearing problems  kidney disease  recent or ongoing radiation therapy  an unusual or allergic reaction to carboplatin, cisplatin, other chemotherapy, other medicines, foods, dyes, or preservatives  pregnant or trying to get pregnant  breast-feeding How should I use this medicine? This drug is usually given as an infusion into a vein. It is administered in a hospital or clinic by a specially trained health care professional. Talk to your pediatrician regarding the use of this medicine in children. Special care may be needed. Overdosage: If you think you have taken too much of this medicine contact a poison control center or emergency room at once. NOTE: This medicine is only for you. Do not share this medicine with others. What if I miss a dose? It is important not to miss a dose. Call your doctor or health care professional if you are unable to keep an appointment. What may interact with this medicine?  medicines for seizures  medicines to increase blood counts like filgrastim, pegfilgrastim, sargramostim  some antibiotics like amikacin, gentamicin, neomycin, streptomycin, tobramycin  vaccines Talk to your doctor or health care professional before taking any of these  medicines:  acetaminophen  aspirin  ibuprofen  ketoprofen  naproxen This list may not describe all possible interactions. Give your health care provider a list of all the medicines, herbs, non-prescription drugs, or dietary supplements you use. Also tell them if you smoke, drink alcohol, or use illegal drugs. Some items may interact with your medicine. What should I watch for while using this medicine? Your condition will be monitored carefully while you are receiving this medicine. You will need important blood work done while you are taking this medicine. This drug may make you feel generally unwell. This is not uncommon, as chemotherapy can affect healthy cells as well as cancer cells. Report any side effects. Continue your course of treatment even though you feel ill unless your doctor tells you to stop. In some cases, you may be given additional medicines to help with side effects. Follow all directions for their use. Call your doctor or health care professional for advice if you get a fever, chills or sore throat, or other symptoms of a cold or flu. Do not treat yourself. This drug decreases your body's ability to fight infections. Try to avoid being around people who are sick. This medicine may increase your risk to bruise or bleed.  Call your doctor or health care professional if you notice any unusual bleeding. Be careful brushing and flossing your teeth or using a toothpick because you may get an infection or bleed more easily. If you have any dental work done, tell your dentist you are receiving this medicine. Avoid taking products that contain aspirin, acetaminophen, ibuprofen, naproxen, or ketoprofen unless instructed by your doctor. These medicines may hide a fever. Do not become pregnant while taking this medicine. Women should inform their doctor if they wish to become pregnant or think they might be pregnant. There is a potential for serious side effects to an unborn child. Talk  to your health care professional or pharmacist for more information. Do not breast-feed an infant while taking this medicine. What side effects may I notice from receiving this medicine? Side effects that you should report to your doctor or health care professional as soon as possible:  allergic reactions like skin rash, itching or hives, swelling of the face, lips, or tongue  signs of infection - fever or chills, cough, sore throat, pain or difficulty passing urine  signs of decreased platelets or bleeding - bruising, pinpoint red spots on the skin, black, tarry stools, nosebleeds  signs of decreased red blood cells - unusually weak or tired, fainting spells, lightheadedness  breathing problems  changes in hearing  changes in vision  chest pain  high blood pressure  low blood counts - This drug may decrease the number of white blood cells, red blood cells and platelets. You may be at increased risk for infections and bleeding.  nausea and vomiting  pain, swelling, redness or irritation at the injection site  pain, tingling, numbness in the hands or feet  problems with balance, talking, walking  trouble passing urine or change in the amount of urine Side effects that usually do not require medical attention (report to your doctor or health care professional if they continue or are bothersome):  hair loss  loss of appetite  metallic taste in the mouth or changes in taste This list may not describe all possible side effects. Call your doctor for medical advice about side effects. You may report side effects to FDA at 1-800-FDA-1088. Where should I keep my medicine? This drug is given in a hospital or clinic and will not be stored at home. NOTE: This sheet is a summary. It may not cover all possible information. If you have questions about this medicine, talk to your doctor, pharmacist, or health care provider.  2020 Elsevier/Gold Standard (2008-02-22 14:38:05)

## 2020-05-04 ENCOUNTER — Telehealth: Payer: Self-pay | Admitting: *Deleted

## 2020-05-04 ENCOUNTER — Inpatient Hospital Stay: Payer: PPO

## 2020-05-04 ENCOUNTER — Other Ambulatory Visit: Payer: Self-pay

## 2020-05-04 ENCOUNTER — Other Ambulatory Visit: Payer: Self-pay | Admitting: Adult Health

## 2020-05-04 VITALS — BP 117/66 | HR 85 | Temp 97.7°F | Resp 20 | Wt 153.2 lb

## 2020-05-04 DIAGNOSIS — C50212 Malignant neoplasm of upper-inner quadrant of left female breast: Secondary | ICD-10-CM

## 2020-05-04 DIAGNOSIS — C569 Malignant neoplasm of unspecified ovary: Secondary | ICD-10-CM

## 2020-05-04 DIAGNOSIS — Z95828 Presence of other vascular implants and grafts: Secondary | ICD-10-CM

## 2020-05-04 DIAGNOSIS — C561 Malignant neoplasm of right ovary: Secondary | ICD-10-CM | POA: Diagnosis not present

## 2020-05-04 MED ORDER — HEPARIN SOD (PORK) LOCK FLUSH 100 UNIT/ML IV SOLN
500.0000 [IU] | Freq: Once | INTRAVENOUS | Status: AC
  Administered 2020-05-04: 500 [IU]
  Filled 2020-05-04: qty 5

## 2020-05-04 MED ORDER — GLIMEPIRIDE 1 MG PO TABS
ORAL_TABLET | ORAL | 1 refills | Status: DC
Start: 2020-05-04 — End: 2023-10-27

## 2020-05-04 MED ORDER — SODIUM CHLORIDE 0.9 % IV SOLN
Freq: Once | INTRAVENOUS | Status: AC
  Filled 2020-05-04: qty 250

## 2020-05-04 MED ORDER — SODIUM CHLORIDE 0.9% FLUSH
10.0000 mL | Freq: Once | INTRAVENOUS | Status: AC
  Administered 2020-05-04: 10 mL
  Filled 2020-05-04: qty 10

## 2020-05-04 NOTE — Progress Notes (Signed)
Called the patient to discuss acute renal function decline which was noted by oncology. Discussed risks with metformin in this situation. Advised she stop taking immediately, will have her monitor glucose and start taking glimepiride 1 mg daily in AM if fasting is running 150+. Reports recent fasting glucose checks ranging 90-150. Discussed risks of hypoglycemia, symptoms to monitor, not to take this med if ill or not eating. Call if any questions or concerns. She will call to reschedule a follow up pending oncology follow up schedule clarification.

## 2020-05-04 NOTE — Patient Instructions (Signed)
Dehydration, Adult Dehydration is condition in which there is not enough water or other fluids in the body. This happens when a person loses more fluids than he or she takes in. Important body parts cannot work right without the right amount of fluids. Any loss of fluids from the body can cause dehydration. Dehydration can be mild, worse, or very bad. It should be treated right away to keep it from getting very bad. What are the causes? This condition may be caused by:  Conditions that cause loss of water or other fluids, such as: ? Watery poop (diarrhea). ? Vomiting. ? Sweating a lot. ? Peeing (urinating) a lot.  Not drinking enough fluids, especially when you: ? Are ill. ? Are doing things that take a lot of energy to do.  Other illnesses and conditions, such as fever or infection.  Certain medicines, such as medicines that take extra fluid out of the body (diuretics).  Lack of safe drinking water.  Not being able to get enough water and food. What increases the risk? The following factors may make you more likely to develop this condition:  Having a long-term (chronic) illness that has not been treated the right way, such as: ? Diabetes. ? Heart disease. ? Kidney disease.  Being 64 years of age or older.  Having a disability.  Living in a place that is high above the ground or sea (high in altitude). The thinner, dried air causes more fluid loss.  Doing exercises that put stress on your body for a long time. What are the signs or symptoms? Symptoms of dehydration depend on how bad it is. Mild or worse dehydration  Thirst.  Dry lips or dry mouth.  Feeling dizzy or light-headed, especially when you stand up from sitting.  Muscle cramps.  Your body making: ? Dark pee (urine). Pee may be the color of tea. ? Less pee than normal. ? Less tears than normal.  Headache. Very bad dehydration  Changes in skin. Skin may: ? Be cold to the touch (clammy). ? Be blotchy  or pale. ? Not go back to normal right after you lightly pinch it and let it go.  Little or no tears, pee, or sweat.  Changes in vital signs, such as: ? Fast breathing. ? Low blood pressure. ? Weak pulse. ? Pulse that is more than 100 beats a minute when you are sitting still.  Other changes, such as: ? Feeling very thirsty. ? Eyes that look hollow (sunken). ? Cold hands and feet. ? Being mixed up (confused). ? Being very tired (lethargic) or having trouble waking from sleep. ? Short-term weight loss. ? Loss of consciousness. How is this treated? Treatment for this condition depends on how bad it is. Treatment should start right away. Do not wait until your condition gets very bad. Very bad dehydration is an emergency. You will need to go to a hospital.  Mild or worse dehydration can be treated at home. You may be asked to: ? Drink more fluids. ? Drink an oral rehydration solution (ORS). This drink helps get the right amounts of fluids and salts and minerals in the blood (electrolytes).  Very bad dehydration can be treated: ? With fluids through an IV tube. ? By getting normal levels of salts and minerals in your blood. This is often done by giving salts and minerals through a tube. The tube is passed through your nose and into your stomach. ? By treating the root cause. Follow these instructions at  home: Oral rehydration solution If told by your doctor, drink an ORS:  Make an ORS. Use instructions on the package.  Start by drinking small amounts, about  cup (120 mL) every 5-10 minutes.  Slowly drink more until you have had the amount that your doctor said to have. Eating and drinking         Drink enough clear fluid to keep your pee pale yellow. If you were told to drink an ORS, finish the ORS first. Then, start slowly drinking other clear fluids. Drink fluids such as: ? Water. Do not drink only water. Doing that can make the salt (sodium) level in your body get too  low. ? Water from ice chips you suck on. ? Fruit juice that you have added water to (diluted). ? Low-calorie sports drinks.  Eat foods that have the right amounts of salts and minerals, such as: ? Bananas. ? Oranges. ? Potatoes. ? Tomatoes. ? Spinach.  Do not drink alcohol.  Avoid: ? Drinks that have a lot of sugar. These include:  High-calorie sports drinks.  Fruit juice that you did not add water to.  Soda.  Caffeine. ? Foods that are greasy or have a lot of fat or sugar. General instructions  Take over-the-counter and prescription medicines only as told by your doctor.  Do not take salt tablets. Doing that can make the salt level in your body get too high.  Return to your normal activities as told by your doctor. Ask your doctor what activities are safe for you.  Keep all follow-up visits as told by your doctor. This is important. Contact a doctor if:  You have pain in your belly (abdomen) and the pain: ? Gets worse. ? Stays in one place.  You have a rash.  You have a stiff neck.  You get angry or annoyed (irritable) more easily than normal.  You are more tired or have a harder time waking than normal.  You feel: ? Weak or dizzy. ? Very thirsty. Get help right away if you have:  Any symptoms of very bad dehydration.  Symptoms of vomiting, such as: ? You cannot eat or drink without vomiting. ? Your vomiting gets worse or does not go away. ? Your vomit has blood or green stuff in it.  Symptoms that get worse with treatment.  A fever.  A very bad headache.  Problems with peeing or pooping (having a bowel movement), such as: ? Watery poop that gets worse or does not go away. ? Blood in your poop (stool). This may cause poop to look black and tarry. ? Not peeing in 6-8 hours. ? Peeing only a small amount of very dark pee in 6-8 hours.  Trouble breathing. These symptoms may be an emergency. Do not wait to see if the symptoms will go away. Get  medical help right away. Call your local emergency services (911 in the [place] Do not drive yourself to the hospital. Summary  Dehydration is a condition in which there is not enough water or other fluids in the body. This happens when a person loses more fluids than he or she takes in.  Treatment for this condition depends on how bad it is. Treatment should be started right away. Do not wait until your condition gets very bad.  Drink enough clear fluid to keep your pee pale yellow. If you were told to drink an oral rehydration solution (ORS), finish the ORS first. Then, start slowly drinking other clear fluids.  Take over-the-counter and prescription medicines only as told by your doctor.  Get help right away if you have any symptoms of very bad dehydration. This information is not intended to replace advice given to you by your health care provider. Make sure you discuss any questions you have with your health care provider. Document Revised: 06/30/2019 Document Reviewed: 06/30/2019 Elsevier Patient Education  [place].

## 2020-05-05 ENCOUNTER — Other Ambulatory Visit: Payer: Self-pay

## 2020-05-05 ENCOUNTER — Inpatient Hospital Stay: Payer: PPO

## 2020-05-05 VITALS — BP 113/55 | HR 79 | Temp 97.7°F | Resp 18

## 2020-05-05 DIAGNOSIS — C50212 Malignant neoplasm of upper-inner quadrant of left female breast: Secondary | ICD-10-CM

## 2020-05-05 DIAGNOSIS — Z95828 Presence of other vascular implants and grafts: Secondary | ICD-10-CM

## 2020-05-05 DIAGNOSIS — C561 Malignant neoplasm of right ovary: Secondary | ICD-10-CM | POA: Diagnosis not present

## 2020-05-05 DIAGNOSIS — C569 Malignant neoplasm of unspecified ovary: Secondary | ICD-10-CM

## 2020-05-05 MED ORDER — SODIUM CHLORIDE 0.9% FLUSH
10.0000 mL | Freq: Once | INTRAVENOUS | Status: AC | PRN
  Administered 2020-05-05: 10 mL
  Filled 2020-05-05: qty 10

## 2020-05-05 MED ORDER — SODIUM CHLORIDE 0.9 % IV SOLN
Freq: Once | INTRAVENOUS | Status: AC
  Filled 2020-05-05: qty 250

## 2020-05-05 MED ORDER — HEPARIN SOD (PORK) LOCK FLUSH 100 UNIT/ML IV SOLN
500.0000 [IU] | Freq: Once | INTRAVENOUS | Status: AC | PRN
  Administered 2020-05-05: 500 [IU]
  Filled 2020-05-05: qty 5

## 2020-05-05 NOTE — Patient Instructions (Signed)

## 2020-05-06 ENCOUNTER — Other Ambulatory Visit: Payer: Self-pay | Admitting: Hematology and Oncology

## 2020-05-06 DIAGNOSIS — C569 Malignant neoplasm of unspecified ovary: Secondary | ICD-10-CM

## 2020-05-06 DIAGNOSIS — Z170 Estrogen receptor positive status [ER+]: Secondary | ICD-10-CM

## 2020-05-07 ENCOUNTER — Inpatient Hospital Stay: Payer: PPO

## 2020-05-07 ENCOUNTER — Inpatient Hospital Stay: Payer: PPO | Admitting: Hematology and Oncology

## 2020-05-07 ENCOUNTER — Encounter: Payer: Self-pay | Admitting: Hematology and Oncology

## 2020-05-07 ENCOUNTER — Telehealth: Payer: Self-pay | Admitting: Oncology

## 2020-05-07 ENCOUNTER — Telehealth: Payer: Self-pay

## 2020-05-07 ENCOUNTER — Other Ambulatory Visit: Payer: Self-pay

## 2020-05-07 DIAGNOSIS — Z170 Estrogen receptor positive status [ER+]: Secondary | ICD-10-CM

## 2020-05-07 DIAGNOSIS — R188 Other ascites: Secondary | ICD-10-CM | POA: Insufficient documentation

## 2020-05-07 DIAGNOSIS — E1122 Type 2 diabetes mellitus with diabetic chronic kidney disease: Secondary | ICD-10-CM

## 2020-05-07 DIAGNOSIS — R180 Malignant ascites: Secondary | ICD-10-CM | POA: Insufficient documentation

## 2020-05-07 DIAGNOSIS — R600 Localized edema: Secondary | ICD-10-CM | POA: Diagnosis not present

## 2020-05-07 DIAGNOSIS — Z95828 Presence of other vascular implants and grafts: Secondary | ICD-10-CM

## 2020-05-07 DIAGNOSIS — C569 Malignant neoplasm of unspecified ovary: Secondary | ICD-10-CM | POA: Diagnosis not present

## 2020-05-07 DIAGNOSIS — N183 Chronic kidney disease, stage 3 unspecified: Secondary | ICD-10-CM

## 2020-05-07 DIAGNOSIS — C561 Malignant neoplasm of right ovary: Secondary | ICD-10-CM | POA: Diagnosis not present

## 2020-05-07 DIAGNOSIS — D630 Anemia in neoplastic disease: Secondary | ICD-10-CM

## 2020-05-07 LAB — CBC WITH DIFFERENTIAL (CANCER CENTER ONLY)
Abs Immature Granulocytes: 0.02 10*3/uL (ref 0.00–0.07)
Basophils Absolute: 0 10*3/uL (ref 0.0–0.1)
Basophils Relative: 0 %
Eosinophils Absolute: 0.2 10*3/uL (ref 0.0–0.5)
Eosinophils Relative: 2 %
HCT: 32.9 % — ABNORMAL LOW (ref 36.0–46.0)
Hemoglobin: 10.7 g/dL — ABNORMAL LOW (ref 12.0–15.0)
Immature Granulocytes: 0 %
Lymphocytes Relative: 12 %
Lymphs Abs: 1 10*3/uL (ref 0.7–4.0)
MCH: 28.2 pg (ref 26.0–34.0)
MCHC: 32.5 g/dL (ref 30.0–36.0)
MCV: 86.8 fL (ref 80.0–100.0)
Monocytes Absolute: 0.2 10*3/uL (ref 0.1–1.0)
Monocytes Relative: 2 %
Neutro Abs: 6.9 10*3/uL (ref 1.7–7.7)
Neutrophils Relative %: 84 %
Platelet Count: 242 10*3/uL (ref 150–400)
RBC: 3.79 MIL/uL — ABNORMAL LOW (ref 3.87–5.11)
RDW: 14.6 % (ref 11.5–15.5)
WBC Count: 8.2 10*3/uL (ref 4.0–10.5)
nRBC: 0 % (ref 0.0–0.2)

## 2020-05-07 LAB — CMP (CANCER CENTER ONLY)
ALT: 16 U/L (ref 0–44)
AST: 38 U/L (ref 15–41)
Albumin: 2.9 g/dL — ABNORMAL LOW (ref 3.5–5.0)
Alkaline Phosphatase: 70 U/L (ref 38–126)
Anion gap: 10 (ref 5–15)
BUN: 45 mg/dL — ABNORMAL HIGH (ref 8–23)
CO2: 20 mmol/L — ABNORMAL LOW (ref 22–32)
Calcium: 8.7 mg/dL — ABNORMAL LOW (ref 8.9–10.3)
Chloride: 110 mmol/L (ref 98–111)
Creatinine: 1.56 mg/dL — ABNORMAL HIGH (ref 0.44–1.00)
GFR, Est AFR Am: 35 mL/min — ABNORMAL LOW (ref >60)
GFR, Estimated: 31 mL/min — ABNORMAL LOW (ref >60)
Glucose, Bld: 106 mg/dL — ABNORMAL HIGH (ref 70–99)
Potassium: 4 mmol/L (ref 3.5–5.1)
Sodium: 140 mmol/L (ref 135–145)
Total Bilirubin: 0.5 mg/dL (ref 0.3–1.2)
Total Protein: 5.9 g/dL — ABNORMAL LOW (ref 6.5–8.1)

## 2020-05-07 LAB — SAMPLE TO BLOOD BANK

## 2020-05-07 MED ORDER — SODIUM CHLORIDE 0.9% FLUSH
10.0000 mL | Freq: Once | INTRAVENOUS | Status: AC | PRN
  Administered 2020-05-07: 10 mL
  Filled 2020-05-07: qty 10

## 2020-05-07 MED ORDER — HEPARIN SOD (PORK) LOCK FLUSH 100 UNIT/ML IV SOLN
500.0000 [IU] | Freq: Once | INTRAVENOUS | Status: DC | PRN
  Filled 2020-05-07: qty 5

## 2020-05-07 NOTE — Assessment & Plan Note (Signed)
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

## 2020-05-07 NOTE — Progress Notes (Signed)
[facility] OFFICE PROGRESS NOTE  Patient Care Team: [name], MD as PCP - Onalee Hua, Wille Glaser, OD as Referring Physician (Optometry) [name], MD as Consulting Physician (Dermatology) Latanya Maudlin, MD as Consulting Physician (Orthopedic Surgery) Inda Castle, MD (Inactive) as Consulting Physician (Gastroenterology) Awanda Mink Craige Cotta, RN as Oncology Nurse Navigator (Oncology) Heath Lark, MD as Consulting Physician (Hematology and Oncology)  ASSESSMENT & PLAN:  Ovarian cancer Kayla Baker) She tolerated however, she felt somewhat bloated with distention of her abdomen On examination, in addition to persistent carcinomatosis, I felt that her abdominal distention is consistent with malignant ascites I will order urgent paracentesis for symptomatic relief I plan to see her again next week for further follow-up I will call her in 3 days to see how she is doing  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 US Paracentesis    Standing Status:   Future    Standing Expiration Date:   05/07/2021    Order Specific Question:   If therapeutic, is there a maximum amount of fluid to be removed?    Answer:   Yes    Order Specific Question:   What is the maximum amount of fluide to be removed?    Answer:   5 liters    Order Specific Question:   Are labs required for specimen collection?    Answer:   No    Order  Specific Question:   Is Albumin medication needed?    Answer:   No    Order Specific Question:   Reason for Exam (SYMPTOM  OR DIAGNOSIS REQUIRED)    Answer:   malignant ascites    Order Specific Question:   Preferred imaging location?    Answer:   [facility]    All questions were answered. The patient knows to call the clinic with any problems, questions or concerns. The total time spent in the appointment was 30 minutes encounter with patients including review of chart and various tests results, discussions about plan of care and coordination of care plan   Heath Lark, MD 05/07/2020 1:58 PM  INTERVAL HISTORY: Please see below for problem oriented charting. She return with her husband for further follow-up Since last time I saw her, she felt bloated and abdomen distention is present She denies nausea from chemotherapy No worsening peripheral neuropathy She is not constipated and takes MiraLAX regularly She denies pain but felt somewhat out of breath due to abdominal distention  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[place])  02/26/2016 Initial Diagnosis   Left breast biopsy 11:00 position: invasive ductal carcinoma with DCIS, ER 90%, PR 10%, HER-2 negative, Ki-67 30%, grade 2, 2.2 cm palpable lesion T2 N0 stage II a clinical stage   03/18/2016 Surgery   Left lumpectomy: Invasive ductal carcinoma, grade 2, 6.3 cm, with high-grade DCIS, margins negative, 0/4 lymph nodes negative, ER 90%, via 10%, HER-2  negative ratio 0.97, Ki-67 30%, T3 N0 stage IIB   03/25/2016 Procedure   Genetic testing is negative for pathogenic mutations within any of the 20 Genes on the breast/ovarian cancer panel   04/04/2016 Oncotype testing   Oncotype DX recurrence score 37, 25% 10 year distant risk of recurrence   04/17/2016 - 07/31/2016 Chemotherapy   Adjuvant chemotherapy with dose dense Adriamycin and Cytoxan followed by Abraxane  weekly 8 ( discontinued due to neuropathy)   09/01/2016 - 09/26/2016 Radiation Therapy   Adj XRT 1) Left breast: 42.5 Gy in 17 fractions. 2) Left breast boost: 7.5 Gy in 3 fractions.   12/02/2016 -  Anti-estrogen oral therapy   Anastrozole 1 mg daily   Ovarian cancer ([place])  04/03/2020 Imaging   US pelvis Complex cystic and solid mass in LEFT adnexa 12.5 cm diameter question cystic ovarian neoplasm; recommend correlation with serum tumor markers and further evaluation by MR imaging with and without contrast.     04/04/2020 Imaging   US venous Doppler No evidence of deep venous thrombosis in the right lower extremity. Left common femoral vein also patent.   04/15/2020 Imaging   MRI pelvis 1. Large complex solid and cystic mass arising from the right adnexa measuring 10.8 by 11.5 by 11.1 cm. This has an aggressive appearance with extensive enhancing mural soft tissue components. Findings are highly suspicious for malignant ovarian neoplasm. 2. Extensive bilateral retroperitoneal and bilateral iliac adenopathy compatible with metastatic disease. 3. Signs of extensive peritoneal carcinomatosis including ascites, enhancement, thickening and nodularity of the peritoneal reflections, omental caking and bulky peritoneal nodularity. 4. Suspected serosal involvement of the dome of bladder with loss of normal fat plane. Cannot rule out mural invasion by tumor.   04/17/2020 Tumor Marker   Patient's tumor was tested for the following markers: CA-125 Results of the tumor marker test revealed [id number].   04/20/2020 Cancer Staging   Staging form: Ovary, Fallopian Tube, and Primary Peritoneal Carcinoma, AJCC 8th Edition - Clinical: FIGO Stage IIIC (cT3, cN1, cM0) - Signed by Heath Lark, MD on 04/20/2020   04/23/2020 Imaging   1. Redemonstrated dominant mixed solid and cystic mass arising from the vicinity of the right ovary measuring at least 11.5 x 10.7 cm, not significantly changed compared to prior MR, and  consistent with primary ovarian malignancy.  2. Numerous bulky retroperitoneal, bilateral iliac, and pelvic sidewall lymph nodes. 3. Moderate volume ascites throughout the abdomen and pelvis with subtle thickening and nodularity throughout the peritoneum, and extensive bulky nodular metastatic disease of the omentum. 4. Constellation of findings is consistent with advanced nodal and peritoneal metastatic disease. 5. There are prominent subcentimeter epicardial lymph nodes, nonspecific although suspicious for nodal metastatic disease. No definite nodal metastatic disease in the chest. Attention on follow-up. 6. There are multiple small subpleural nodules at the right lung base overlying the diaphragm, measuring up to 7 mm. These are generally nonspecific and less favored to represent pulmonary metastatic disease given distribution. Attention on follow-up. 7. Somewhat coarse contour of the liver, suggestive of cirrhosis, although out without overt morphologic stigmata. 8. Aortic Atherosclerosis (ICD10-I70.0).     04/24/2020 Procedure   Successful placement of a right internal jugular approach power injectable Port-A-Cath. The catheter is ready for immediate use.     05/02/2020 Tumor Marker   Patient's tumor was tested for the following markers: CA-125 Results of the tumor marker test revealed [id number]   05/03/2020 -  Chemotherapy   The patient had carboplatin and taxol for chemotherapy treatment.  REVIEW OF SYSTEMS:   Constitutional: Denies fevers, chills or abnormal weight loss Eyes: Denies blurriness of vision Ears, nose, mouth, throat, and face: Denies mucositis or sore throat Cardiovascular: Denies palpitation, chest discomfort  Gastrointestinal:  Denies nausea, heartburn or change in bowel habits Skin: Denies abnormal skin rashes Lymphatics: Denies new lymphadenopathy or easy bruising Neurological:Denies numbness, tingling or new weaknesses Behavioral/Psych: Mood is stable, no new  changes  All other systems were reviewed with the patient and are negative.  I have reviewed the past medical history, past surgical history, social history and family history with the patient and they are unchanged from previous note.  ALLERGIES:  is allergic to keflex [cephalexin]; meloxicam; prednisone; premarin [estrogens conjugated]; and trovan [alatrofloxacin].  MEDICATIONS:  Current Outpatient Medications  Medication Sig Dispense Refill  . acetaminophen (TYLENOL) 325 MG tablet Take 325 mg by mouth every 6 (six) hours as needed for headache.    . allopurinol (ZYLOPRIM) 300 MG tablet Take 1 tablet Daily to Prevent Gout (Patient taking differently: Take 300 mg by mouth daily. ) 90 tablet 3  . Cholecalciferol (VITAMIN D3) 5000 units CAPS Take 5,000 Units by mouth daily.    Marland Kitchen dexamethasone (DECADRON) 4 MG tablet Take 2 tabs at the night before and 2 tab the morning of chemotherapy, every 3 weeks, by mouth 36 tablet 5  . ezetimibe (ZETIA) 10 MG tablet Take 1 tablet Daily for Cholesterol (Patient taking differently: Take 10 mg by mouth daily. ) 90 tablet 3  . gabapentin (NEURONTIN) 300 MG capsule Take 1 capsule 3 to 4  x /day as needed for pain (Patient taking differently: Take 300 mg by mouth See admin instructions. Take 300 mg 3 times daily may take a 4th 300 mg dose as needed for pain) 360 capsule 3  . glimepiride (AMARYL) 1 MG tablet Take 1 tab by mouth in the morning if fasting is running 150+. Please do not take this medication if you are ill or not eating as this can cause a low blood sugar. 90 tablet 1  . glucose blood (FREESTYLE TEST STRIPS) test strip Check Blood Sugar Daily as directed (Dx: E11.29) 100 strip 3  . Lancets (FREESTYLE) lancets CHECK BLOOD SUGAR DAILY AS DIRECTED 100 each 3  . lidocaine-prilocaine (EMLA) cream Apply to affected area once (Patient taking differently: Apply 1 application topically daily as needed (port access). ) 30 g 3  . loratadine (CLARITIN) 10 MG tablet  Take 10 mg by mouth daily as needed for allergies.    . Magnesium ([employer] MAGNESIUM) 250 MG TABS Take 500 mg by mouth 2 (two) times daily.    . metoprolol succinate (TOPROL-XL) 25 MG 24 hr tablet Take 1 tablet Daily for BP (Patient taking differently: Take 25 mg by mouth daily. ) 90 tablet 3  . mirabegron ER (MYRBETRIQ) 50 MG TB24 tablet Take 1 tablet (50 mg total) by mouth daily. 30 tablet 2  . ondansetron (ZOFRAN) 8 MG tablet Take 1 tablet (8 mg total) by mouth 2 (two) times daily as needed for refractory nausea / vomiting. Start on day 3 after carboplatin chemo. 30 tablet 1  . prochlorperazine (COMPAZINE) 10 MG tablet Take 1 tablet (10 mg total) by mouth every 6 (six) hours as needed for nausea or vomiting. 60 tablet 9  . rosuvastatin (CRESTOR) 5 MG tablet Take 1 tablet 3 x /week on - Mon Wed Fri - for Cholesterol (Patient taking differently: Take 5 mg by mouth every Monday, Wednesday, and Friday. ) 90  tablet 0  . vitamin C (ASCORBIC ACID) 500 MG tablet Take 500 mg by mouth every evening.      No current facility-administered medications for this visit.    PHYSICAL EXAMINATION: ECOG PERFORMANCE STATUS: 2 - Symptomatic, <50% confined to bed  Vitals:   05/07/20 1307  BP: (!) 120/52  Pulse: (!) 103  Resp: 18  Temp: (!) 97.5 F (36.4 C)  SpO2: 100%   Filed Weights   05/07/20 1307  Weight: 155 lb (70.3 kg)    GENERAL:alert, no distress and comfortable SKIN: skin color, texture, turgor are normal, no rashes or significant lesions EYES: normal, Conjunctiva are pink and non-injected, sclera clear OROPHARYNX:no exudate, no erythema and lips, buccal mucosa, and tongue normal  NECK: supple, thyroid normal size, non-tender, without nodularity LYMPH:  no palpable lymphadenopathy in the cervical, axillary or inguinal LUNGS: clear to auscultation and percussion with normal breathing effort HEART: regular rate & rhythm and no murmurs with mild bilateral lower extremity edema ABDOMEN:abdomen  soft, distended with ascites, persistent carcinomatosis palpable on exam Musculoskeletal:no cyanosis of digits and no clubbing  NEURO: alert & oriented x 3 with fluent speech, no focal motor/sensory deficits  LABORATORY DATA:  I have reviewed the data as listed    Component Value Date/Time   NA 140 05/07/2020 1234   NA 141 11/06/2016 1102   K 4.0 05/07/2020 1234   K 3.9 11/06/2016 1102   CL 110 05/07/2020 1234   CO2 20 (L) 05/07/2020 1234   CO2 27 11/06/2016 1102   GLUCOSE 106 (H) 05/07/2020 1234   GLUCOSE 130 11/06/2016 1102   BUN 45 (H) 05/07/2020 1234   BUN 18.0 11/06/2016 1102   CREATININE 1.56 (H) 05/07/2020 1234   CREATININE 1.35 (H) 04/11/2020 1027   CREATININE 1.3 (H) 11/06/2016 1102   CALCIUM 8.7 (L) 05/07/2020 1234   CALCIUM 10.2 11/06/2016 1102   PROT 5.9 (L) 05/07/2020 1234   PROT 7.1 11/06/2016 1102   ALBUMIN 2.9 (L) 05/07/2020 1234   ALBUMIN 3.7 11/06/2016 1102   AST 38 05/07/2020 1234   AST 52 (H) 11/06/2016 1102   ALT 16 05/07/2020 1234   ALT 58 (H) 11/06/2016 1102   ALKPHOS 70 05/07/2020 1234   ALKPHOS 159 (H) 11/06/2016 1102   BILITOT 0.5 05/07/2020 1234   BILITOT 0.53 11/06/2016 1102   GFRNONAA 31 (L) 05/07/2020 1234   GFRNONAA 36 (L) 04/11/2020 1027   GFRAA 35 (L) 05/07/2020 1234   GFRAA 42 (L) 04/11/2020 1027    No results found for: SPEP, UPEP  Lab Results  Component Value Date   WBC 8.2 05/07/2020   NEUTROABS 6.9 05/07/2020   HGB 10.7 (L) 05/07/2020   HCT 32.9 (L) 05/07/2020   MCV 86.8 05/07/2020   PLT 242 05/07/2020      Chemistry      Component Value Date/Time   NA 140 05/07/2020 1234   NA 141 11/06/2016 1102   K 4.0 05/07/2020 1234   K 3.9 11/06/2016 1102   CL 110 05/07/2020 1234   CO2 20 (L) 05/07/2020 1234   CO2 27 11/06/2016 1102   BUN 45 (H) 05/07/2020 1234   BUN 18.0 11/06/2016 1102   CREATININE 1.56 (H) 05/07/2020 1234   CREATININE 1.35 (H) 04/11/2020 1027   CREATININE 1.3 (H) 11/06/2016 1102      Component Value  Date/Time   CALCIUM 8.7 (L) 05/07/2020 1234   CALCIUM 10.2 11/06/2016 1102   ALKPHOS 70 05/07/2020 1234   ALKPHOS 159 (H) 11/06/2016  1102   AST 38 05/07/2020 1234   AST 52 (H) 11/06/2016 1102   ALT 16 05/07/2020 1234   ALT 58 (H) 11/06/2016 1102   BILITOT 0.5 05/07/2020 1234   BILITOT 0.53 11/06/2016 1102

## 2020-05-07 NOTE — Assessment & Plan Note (Signed)
She has mild bilateral leg edema likely secondary to fluid retention from recent aggressive IV fluid resuscitation Her serum albumin is also low We discussed discontinuation of IV fluids and focus on high-protein intake

## 2020-05-07 NOTE — Telephone Encounter (Signed)
Given appt for paracentesis at 9 am tomorrow at [facility] Instructed to arrive at 845 am. She verbalzied understanding.

## 2020-05-07 NOTE — Telephone Encounter (Signed)
[name] (husband) left a message saying that Cristie had a bad weekend with pain, swelling of her abdomen.  Called him back and he said the pain started yesterday and she struggled through the day.  Her stomach is very swollen and is pushing up to her breasts.  Also spoke to [place] and she said she is having a lot of pressure in her abdomen.  It is very uncomfortable and she is even having trouble sitting on the commode. She denies having any trouble breathing and did have a bowel movement this morning.  They are wondering if they should keep the appointments today or go the ER.

## 2020-05-07 NOTE — Telephone Encounter (Signed)
Called Kayla Baker back and advised her of message from Dr. [name].  She is going to keep the appointment today.  Advised her to call back if she does go to the ER.

## 2020-05-07 NOTE — Assessment & Plan Note (Signed)
She tolerated however, she felt somewhat bloated with distention of her abdomen On examination, in addition to persistent carcinomatosis, I felt that her abdominal distention is consistent with malignant ascites I will order urgent paracentesis for symptomatic relief I plan to see her again next week for further follow-up I will call her in 3 days to see how she is doing

## 2020-05-07 NOTE — Telephone Encounter (Signed)
I do not have ability to move her appt up If she is weak and uncomfortable she should go to the ER

## 2020-05-07 NOTE — Assessment & Plan Note (Signed)
Her renal function is improved with aggressive IV fluid resuscitation I recommend increase oral intake as tolerated She does not need further IV fluids today

## 2020-05-08 ENCOUNTER — Telehealth: Payer: Self-pay | Admitting: Hematology and Oncology

## 2020-05-08 ENCOUNTER — Ambulatory Visit (HOSPITAL_COMMUNITY)
Admission: RE | Admit: 2020-05-08 | Discharge: 2020-05-08 | Disposition: A | Discharge status: Home / Self Care | Payer: PPO | Source: Ambulatory Visit | Attending: Hematology and Oncology | Admitting: Hematology and Oncology

## 2020-05-08 DIAGNOSIS — C569 Malignant neoplasm of unspecified ovary: Secondary | ICD-10-CM | POA: Diagnosis not present

## 2020-05-08 DIAGNOSIS — R188 Other ascites: Secondary | ICD-10-CM | POA: Diagnosis not present

## 2020-05-08 DIAGNOSIS — R180 Malignant ascites: Secondary | ICD-10-CM | POA: Insufficient documentation

## 2020-05-08 HISTORY — PX: IR PARACENTESIS: IMG2679

## 2020-05-08 MED ORDER — LIDOCAINE HCL 1 % IJ SOLN
INTRAMUSCULAR | Status: DC | PRN
  Administered 2020-05-08: 10 mL

## 2020-05-08 MED ORDER — LIDOCAINE HCL 1 % IJ SOLN
INTRAMUSCULAR | Status: AC
  Filled 2020-05-08: qty 20

## 2020-05-08 NOTE — Progress Notes (Signed)
FOLLOW UP  Assessment and Plan:   Hypertension Well controlled with current medications  Monitor blood pressure at home; patient to call if consistently greater than 130/80 Continue DASH diet.   Reminder to go to the ER if any CP, SOB, nausea, dizziness, severe HA, changes vision/speech, left arm numbness and tingling and jaw pain.  Hyperlipidemia associated with T2DM (HCC) On rosuvastatin 5 mg three days a week- hx of LFT elevation with atorvastatin - holding per oncology instructions while doing chemotherapy  LDL goal is <70 Continue low cholesterol diet and exercise.  Check lipid panel.   Diabetes with diabetic chronic kidney disease Currently with slow weight loss and A1Cs in prediabetic range; newly off metformin, on PRN low dose glimepiride - hasn't needed; take only if fasting 150+ Continue diet and exercise.  Perform daily foot/skin check, notify office of any concerning changes.  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 Hollie Salk Off of metformin Increase fluids, avoid NSAIDS, monitor sugars, will monitor  Diabetic neuropathy, painful (HCC)/ chemotherapy induced neuropathy (HCC) Currently prescribed cymbalta during the day, gabapentin 100-300 mg at night.   Vitamin D Def/ osteoporosis prevention At goal at recent check; continue to recommend supplementation for goal of 60-100 Check vitamin D level   Ovarian cancer [facility]) Chemotherapy followed by Dr. [name], has port, newly started chemotherapy and did well  Recent paracentesis for malignant ascites and feeling much improved; suspect will need   Continue diet and meds as discussed. Further disposition pending results of labs. Discussed med's effects and SE's.   Over 30 minutes of exam, counseling, chart review, and critical decision making was performed.   Future Appointments  Date Time Provider [place]  05/14/2020 12:15 PM [facility] LAB 1 [facility] None  05/14/2020 12:30 PM [facility]  [place] [facility] None  05/14/2020  1:00 PM [name], Ni, MD [facility] None  05/24/2020  8:00 AM [facility] LAB 4 [facility] None  05/24/2020  8:15 AM [facility] [place] [facility] None  05/24/2020  8:40 AM Heath Lark, MD [facility] None  05/24/2020  9:45 AM [facility] INFUSION [facility] None  08/10/2020  9:00 AM Liane Comber, NP [facility] None  09/14/2020 11:15 AM [name], MD [facility] None    ----------------------------------------------------------------------------------------------------------------------  HPI 84 y.o. female  presents for 3 month follow up on hypertension, cholesterol, diabetes with CKD and vitamin D deficiency.   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  Earlier this year she was unfortunately diagnosed with large R sided ovarian tumor with consequent RLE edema, urinary incontinence, had port cath placed and started chemotherapy carboplaxin and taxol 05/03/2020 managed by Dr. Heath Lark. Had follow up with her recently and felt abdominal distention with persistent carcinomatosis, malignant ascites and underwent urgent paracentesis with 2.6 L removed 05/09/2020. Patient reports she did well with first chemotherapy, mild fatigue, reports breathing much improved following procedure.   Noted recent renal functions decline (Cr 1.35 < 2.45 on 05/02/2020), improved after IV hydration but we called to advise needs to stop metformin. She was prescribe glimepiride but advised to initiate low dose 1 mg with breakfast only if fasting 150+ due to age/frailty and concern for hypoglycemia  Lab Results  Component Value Date   CREATININE 1.56 (H) 05/07/2020   CREATININE 2.45 (H) 05/02/2020   CREATININE 1.35 (H) 04/11/2020   Lab Results  Component Value Date   GFRNONAA 31 (L) 05/07/2020  GFRNONAA 18 (L) 05/02/2020   GFRNONAA 36 (L) 04/11/2020   Patient also has gout  and GERD well controlled by medications.   BMI is Body mass index is 25.75 kg/m., she has been working on diet and exercise. Wt Readings from Last 3 Encounters:  05/09/20 150 lb (68 kg)  05/07/20 155 lb (70.3 kg)  05/04/20 153 lb 3.2 oz (69.5 kg)   Her blood pressure has been controlled at home, today their BP is BP: 110/60  She does not currently workout on a consistent basis due to ongoing neuropathy- she is prescribed cymbalta and gabapentin for neuropathy. She denies chest pain, shortness of breath, dizziness.   She is on cholesterol medication (zetia, rosuvastatin 5 mg three days a week - holding per oncology while doing chemotherapy) and denies myalgias. She reports she was previously on atorvastatin but this was discontinued secondary to elevated LFTs. Her cholesterol is not at goal. The cholesterol last visit was:   Lab Results  Component Value Date   CHOL 143 10/25/2019   HDL 41 (L) 10/25/2019   LDLCALC 78 10/25/2019   TRIG 138 10/25/2019   CHOLHDL 3.5 10/25/2019    She has been working on diet and exercise for T2 diabetes with CKD 3/4 (off of metformin, newly takes glimepiride 1 mg AM if glucose 150+), and denies increased appetite, nausea, polydipsia, polyuria, visual disturbances, vomiting and weight loss.  She does check fasting glucose, ranges 90-124, this AM 112. Hasn't needed to take glimepiride. Last A1C in the office was:  Lab Results  Component Value Date   HGBA1C 6.3 (H) 10/25/2019   Monitoring closely due to metformin therapy, follows with Dr. Hollie Salk Lab Results  Component Value Date   GFRNONAA 31 (L) 05/07/2020   Patient is on Vitamin D supplement and above goal at the last check:   Lab Results  Component Value Date   VD25OH 53 10/25/2019     Patient is on allopurinol, taking 150 mg only for gout and does not report a recent flare.  Lab Results  Component Value Date   LABURIC 5.3 10/25/2019     Current Medications:  Current Outpatient Medications on  File Prior to Visit  Medication Sig  . acetaminophen (TYLENOL) 325 MG tablet Take 325 mg by mouth every 6 (six) hours as needed for headache.  . Cholecalciferol (VITAMIN D3) 5000 units CAPS Take 5,000 Units by mouth daily.  Marland Kitchen dexamethasone (DECADRON) 4 MG tablet Take 2 tabs at the night before and 2 tab the morning of chemotherapy, every 3 weeks, by mouth  . gabapentin (NEURONTIN) 300 MG capsule Take 1 capsule 3 to 4  x /day as needed for pain (Patient taking differently: Take 300 mg by mouth See admin instructions. Take 300 mg 3 times daily may take a 4th 300 mg dose as needed for pain)  . glimepiride (AMARYL) 1 MG tablet Take 1 tab by mouth in the morning if fasting is running 150+. Please do not take this medication if you are ill or not eating as this can cause a low blood sugar.  Marland Kitchen glucose blood (FREESTYLE TEST STRIPS) test strip Check Blood Sugar Daily as directed (Dx: E11.29)  . Lancets (FREESTYLE) lancets CHECK BLOOD SUGAR DAILY AS DIRECTED  . lidocaine-prilocaine (EMLA) cream Apply to affected area once (Patient taking differently: Apply 1 application topically daily as needed (port access). )  . loratadine (CLARITIN) 10 MG tablet Take 10 mg by mouth daily as needed for allergies.  . Magnesium ([employer]  MAGNESIUM) 250 MG TABS Take 250 mg by mouth 2 (two) times daily.  . metoprolol succinate (TOPROL-XL) 25 MG 24 hr tablet Take 1 tablet Daily for BP (Patient taking differently: Take 25 mg by mouth daily. )  . mirabegron ER (MYRBETRIQ) 50 MG TB24 tablet Take 1 tablet (50 mg total) by mouth daily.  . vitamin C (ASCORBIC ACID) 500 MG tablet Take 500 mg by mouth every evening.   . ezetimibe (ZETIA) 10 MG tablet Take 1 tablet Daily for Cholesterol (Patient not taking: Reported on 05/09/2020)  . ondansetron (ZOFRAN) 8 MG tablet Take 1 tablet (8 mg total) by mouth 2 (two) times daily as needed for refractory nausea / vomiting. Start on day 3 after carboplatin chemo. (Patient not taking: Reported on  05/09/2020)  . prochlorperazine (COMPAZINE) 10 MG tablet Take 1 tablet (10 mg total) by mouth every 6 (six) hours as needed for nausea or vomiting. (Patient not taking: Reported on 05/09/2020)  . rosuvastatin (CRESTOR) 5 MG tablet Take 1 tablet 3 x /week on - Mon Wed Fri - for Cholesterol (Patient not taking: Reported on 05/09/2020)   No current facility-administered medications on file prior to visit.     Allergies:  Allergies  Allergen Reactions  . Keflex [Cephalexin] Swelling    Tongue and throat swells  . Meloxicam Swelling  . Prednisone Swelling    Can Not take High doses  . Premarin [Estrogens Conjugated] Hives  . Trovan [Alatrofloxacin] Other (See Comments)    Unknown reaction     Medical History:  Past Medical History:  Diagnosis Date  . Allergy   . Anemia   . Arthritis   . Blood transfusion without reported diagnosis   . Breast cancer of upper-inner quadrant of left female breast ([place]) 02/29/2016   skin- 2016- squamous- on right upper arm  . GERD (gastroesophageal reflux disease)   . Gout    takes Allopurinol daily  . History of chemotherapy   . History of colon polyps    benign  . History of shingles   . Hx of radiation therapy   . Hyperlipidemia   . Hypertension   . Ovarian ca ([place]) dx'd 03/2020  . Personal history of chemotherapy    2017  . Personal history of radiation therapy    2017  . Vitamin D deficiency    Family history- Reviewed and unchanged Social history- Reviewed and unchanged   Review of Systems:  Review of Systems  Constitutional: Negative for malaise/fatigue and weight loss.  HENT: Negative for hearing loss and tinnitus.   Eyes: Negative for blurred vision and double vision.  Respiratory: Negative for cough, shortness of breath and wheezing.   Cardiovascular: Positive for leg swelling (R>L). Negative for chest pain, palpitations, orthopnea and claudication.  Gastrointestinal: Negative for abdominal pain, blood in stool, constipation,  diarrhea, heartburn, melena, nausea and vomiting.  Genitourinary: Negative.   Musculoskeletal: Negative for joint pain and myalgias.  Skin: Negative for rash.  Neurological: Negative for dizziness, tingling (Consistent with baseline in bilateral feet), sensory change, weakness and headaches.  Endo/Heme/Allergies: Negative for polydipsia.  Psychiatric/Behavioral: Negative.  The patient does not have insomnia.   All other systems reviewed and are negative.   Physical Exam: BP 110/60   Pulse (!) 104   Temp 97.7 F (36.5 C)   Ht 5' 4"  (1.626 m)   Wt 150 lb (68 kg)   SpO2 99%   BMI 25.75 kg/m  Wt Readings from Last 3 Encounters:  05/09/20 150 lb (  68 kg)  05/07/20 155 lb (70.3 kg)  05/04/20 153 lb 3.2 oz (69.5 kg)   General Appearance: Chronically ill appearing well dressed elderly female, in no apparent distress. Eyes: PERRLA, EOMs, conjunctiva no swelling or erythema Sinuses: No Frontal/maxillary tenderness ENT/Mouth: Ext aud canals clear, TMs without erythema, bulging. No erythema, swelling, or exudate on post pharynx.  Tonsils not swollen or erythematous. Hearing normal.  Neck: Supple, thyroid normal.  Respiratory: Respiratory effort normal, BS equal bilaterally without rales, rhonchi, wheezing or stridor.  Cardio: RRR with no MRGs. Brisk peripheral pulses without edema.  Abdomen: Firm, distended, + BS.  Non tender, no guarding, rebound, hernias. Recent paracentesis stab site to RLQ with glue intact, no discharge or tenderness.  Lymphatics: Non tender without lymphadenopathy.  Musculoskeletal: Full ROM, 5/5 strength, Normal gait Skin: Warm, dry without rashes, lesions, ecchymosis.  Neuro: Cranial nerves intact. No cerebellar symptoms.  Psych: Awake and oriented X 3, normal affect, Insight and Judgment appropriate.    Izora Ribas, NP 1:17 PM [facility]

## 2020-05-08 NOTE — Procedures (Signed)
PROCEDURE SUMMARY:  Successful image-guided paracentesis from the right upper abdomen.  Yielded 2.6 liters of serosanguineous fluid.  No immediate complications.  EBL: zero Patient tolerated well.   Specimen was not sent for labs.  Please see imaging section of Epic for full dictation.  Joaquim Nam PA-C 05/08/2020 9:11 AM

## 2020-05-08 NOTE — Telephone Encounter (Signed)
Scheduled per 6/7 sch message. Unable to reach pt. Left voicemail- appts added on 6/14.

## 2020-05-09 ENCOUNTER — Encounter: Payer: Self-pay | Admitting: Adult Health

## 2020-05-09 ENCOUNTER — Telehealth: Payer: Self-pay

## 2020-05-09 ENCOUNTER — Ambulatory Visit (INDEPENDENT_AMBULATORY_CARE_PROVIDER_SITE_OTHER): Payer: PPO | Admitting: Adult Health

## 2020-05-09 ENCOUNTER — Other Ambulatory Visit: Payer: Self-pay

## 2020-05-09 VITALS — BP 110/60 | HR 104 | Temp 97.7°F | Ht 64.0 in | Wt 150.0 lb

## 2020-05-09 DIAGNOSIS — C569 Malignant neoplasm of unspecified ovary: Secondary | ICD-10-CM | POA: Diagnosis not present

## 2020-05-09 DIAGNOSIS — E785 Hyperlipidemia, unspecified: Secondary | ICD-10-CM | POA: Diagnosis not present

## 2020-05-09 DIAGNOSIS — E1169 Type 2 diabetes mellitus with other specified complication: Secondary | ICD-10-CM

## 2020-05-09 DIAGNOSIS — M1000 Idiopathic gout, unspecified site: Secondary | ICD-10-CM

## 2020-05-09 DIAGNOSIS — Z95828 Presence of other vascular implants and grafts: Secondary | ICD-10-CM | POA: Diagnosis not present

## 2020-05-09 DIAGNOSIS — E559 Vitamin D deficiency, unspecified: Secondary | ICD-10-CM

## 2020-05-09 DIAGNOSIS — N183 Chronic kidney disease, stage 3 unspecified: Secondary | ICD-10-CM | POA: Diagnosis not present

## 2020-05-09 DIAGNOSIS — E1122 Type 2 diabetes mellitus with diabetic chronic kidney disease: Secondary | ICD-10-CM | POA: Diagnosis not present

## 2020-05-09 DIAGNOSIS — E1140 Type 2 diabetes mellitus with diabetic neuropathy, unspecified: Secondary | ICD-10-CM

## 2020-05-09 DIAGNOSIS — G620 Drug-induced polyneuropathy: Secondary | ICD-10-CM

## 2020-05-09 DIAGNOSIS — Z79899 Other long term (current) drug therapy: Secondary | ICD-10-CM

## 2020-05-09 DIAGNOSIS — I10 Essential (primary) hypertension: Secondary | ICD-10-CM | POA: Diagnosis not present

## 2020-05-09 MED ORDER — BLOOD GLUCOSE MONITOR KIT: PACK | 12 refills | Status: DC

## 2020-05-09 MED ORDER — ALLOPURINOL 300 MG PO TABS: ORAL_TABLET | ORAL | 3 refills | Status: DC

## 2020-05-09 NOTE — Telephone Encounter (Signed)
New meter and supplies requested.

## 2020-05-09 NOTE — Patient Instructions (Signed)
Paracentesis Paracentesis is a procedure to remove excess fluid from the abdomen. When a person collects too much fluid in the abdomen, it is called ascites. Ascites can result from certain conditions, such as chronic scarring of the liver (cirrhosis), cancer, heart failure, or infection. In paracentesis, the fluid is removed using a needle that is inserted through the skin and tissue into the abdomen. This procedure may be done to:  Find the cause of the ascites.  Relieve symptoms that are caused by the ascites, such as pain or shortness of breath. Tell a health care provider about:  Any allergies you have.  All medicines you are taking, including vitamins, herbs, eye drops, creams, and over-the-counter medicines.  Any problems you or family members have had with anesthetic medicines.  Any blood disorders you have.  Any surgeries you have had.  Any medical conditions you have.  Whether you are pregnant or may be pregnant. What are the risks? Generally, this is a safe procedure. However, problems may occur, including:  Infection.  Bleeding.  Injury to an abdominal organ, such as the bowel (large intestine), liver, spleen, or bladder.  Low blood pressure (hypotension).  Mental status changes in people who have liver disease. These changes would be caused by shifts in the balance of fluids and minerals (electrolytes) in the body. What happens before the procedure?  Ask your health care provider about: ? Changing or stopping your regular medicines. This is especially important if you are taking diabetes medicines or blood thinners. ? Taking medicines such as aspirin and ibuprofen. These medicines can thin your blood. Do not take these medicines unless your health care provider tells you to take them. ? Taking over-the-counter medicines, vitamins, herbs, and supplements.  Ask your health care provider what steps will be taken to help prevent infection. These may include washing  skin with a germ-killing soap.  A blood sample may be taken to determine your blood clotting time.  You may have imaging tests.  You will be asked to urinate. What happens during the procedure?   You may be asked to lie on your back with your head raised (elevated).  You will be given a medicine to numb the area (local anesthetic) where the needle will be inserted.  Your abdominal skin will be punctured with a needle.  A drainage tube will be connected to the needle. Fluid will drain through the tube into a container.  After enough fluid has been removed, the needle will be removed.  A sample of the fluid will be sent for examination and testing.  A bandage (dressing) will be placed over the puncture site. The procedure may vary among health care providers and hospitals. What happens after the procedure? It is up to you to get the results of your procedure. Ask your health care provider, or the department that is doing the procedure, when your results will be ready. Summary  Paracentesis is a procedure to remove excess fluid from the abdomen using a needle.  This procedure may be done to find the cause of ascites or to help relieve your symptoms.  Your skin will be numbed with medicine (local anesthetic) before the needle is inserted.  A sample of the removed fluid will be sent for examination and testing. This information is not intended to replace advice given to you by your health care provider. Make sure you discuss any questions you have with your health care provider. Document Revised: 10/19/2018 Document Reviewed: 09/07/2018 Elsevier Patient Education  [employer].

## 2020-05-10 ENCOUNTER — Telehealth: Payer: Self-pay

## 2020-05-10 LAB — MAGNESIUM: Magnesium: 1.6 mg/dL (ref 1.5–2.5)

## 2020-05-10 LAB — LIPID PANEL
Cholesterol: 126 mg/dL (ref <200)
HDL: 37 mg/dL — ABNORMAL LOW (ref >=50)
LDL Cholesterol (Calc): 67 mg/dL (calc)
Non-HDL Cholesterol (Calc): 89 mg/dL (calc) (ref <130)
Total CHOL/HDL Ratio: 3.4 (calc) (ref <5.0)
Triglycerides: 132 mg/dL (ref <150)

## 2020-05-10 LAB — COMPLETE METABOLIC PANEL WITH GFR
AG Ratio: 1.5 (calc) (ref 1.0–2.5)
ALT: 13 U/L (ref 6–29)
AST: 36 U/L — ABNORMAL HIGH (ref 10–35)
Albumin: 3.2 g/dL — ABNORMAL LOW (ref 3.6–5.1)
Alkaline phosphatase (APISO): 77 U/L (ref 37–153)
BUN/Creatinine Ratio: 25 (calc) — ABNORMAL HIGH (ref 6–22)
BUN: 37 mg/dL — ABNORMAL HIGH (ref 7–25)
CO2: 23 mmol/L (ref 20–32)
Calcium: 8.8 mg/dL (ref 8.6–10.4)
Chloride: 105 mmol/L (ref 98–110)
Creat: 1.48 mg/dL — ABNORMAL HIGH (ref 0.60–0.88)
GFR, Est African American: 38 mL/min/{1.73_m2} — ABNORMAL LOW (ref >=60)
GFR, Est Non African American: 33 mL/min/{1.73_m2} — ABNORMAL LOW (ref >=60)
Globulin: 2.1 g/dL (calc) (ref 1.9–3.7)
Glucose, Bld: 140 mg/dL — ABNORMAL HIGH (ref 65–99)
Potassium: 5.1 mmol/L (ref 3.5–5.3)
Sodium: 138 mmol/L (ref 135–146)
Total Bilirubin: 0.3 mg/dL (ref 0.2–1.2)
Total Protein: 5.3 g/dL — ABNORMAL LOW (ref 6.1–8.1)

## 2020-05-10 LAB — CBC WITH DIFFERENTIAL/PLATELET
Absolute Monocytes: 229 cells/uL (ref 200–950)
Basophils Absolute: 19 cells/uL (ref 0–200)
Basophils Relative: 0.3 %
Eosinophils Absolute: 279 cells/uL (ref 15–500)
Eosinophils Relative: 4.5 %
HCT: 31.5 % — ABNORMAL LOW (ref 35.0–45.0)
Hemoglobin: 10 g/dL — ABNORMAL LOW (ref 11.7–15.5)
Lymphs Abs: 880 cells/uL (ref 850–3900)
MCH: 28.2 pg (ref 27.0–33.0)
MCHC: 31.7 g/dL — ABNORMAL LOW (ref 32.0–36.0)
MCV: 88.7 fL (ref 80.0–100.0)
MPV: 11.7 fL (ref 7.5–12.5)
Monocytes Relative: 3.7 %
Neutro Abs: 4793 cells/uL (ref 1500–7800)
Neutrophils Relative %: 77.3 %
Platelets: 200 10*3/uL (ref 140–400)
RBC: 3.55 10*6/uL — ABNORMAL LOW (ref 3.80–5.10)
RDW: 13.9 % (ref 11.0–15.0)
Total Lymphocyte: 14.2 %
WBC: 6.2 10*3/uL (ref 3.8–10.8)

## 2020-05-10 LAB — TSH: TSH: 4.47 mIU/L (ref 0.40–4.50)

## 2020-05-10 LAB — HEMOGLOBIN A1C
Hgb A1c MFr Bld: 5.9 % of total Hgb — ABNORMAL HIGH (ref <5.7)
Mean Plasma Glucose: 123 (calc)
eAG (mmol/L): 6.8 (calc)

## 2020-05-10 LAB — URIC ACID: Uric Acid, Serum: 4.9 mg/dL (ref 2.5–7.0)

## 2020-05-10 LAB — VITAMIN D 25 HYDROXY (VIT D DEFICIENCY, FRACTURES): Vit D, 25-Hydroxy: 37 ng/mL (ref 30–100)

## 2020-05-10 NOTE — Telephone Encounter (Signed)
-----   Message from Heath Lark, MD sent at 05/10/2020  9:29 AM EDT ----- Regarding: can you call her and check how she is doing? eating/drinking ok?

## 2020-05-10 NOTE — Telephone Encounter (Signed)
TC to Pt. Per Dr. [name] Pt stated she is feeling ok today. She stated she is eating and drinking, Her husband purchased Gatorade and water for her to stay hydrated. She stated she felt a little full, that surprised her because she just had fluid drained on Tuesday but overall she feels ok today. Asked her if she felt sob she stated no. Informed her if  There were any changes to give [place] a call. Pt verbalized understanding. No further problems or concerns noted.

## 2020-05-14 ENCOUNTER — Other Ambulatory Visit: Payer: Self-pay

## 2020-05-14 ENCOUNTER — Inpatient Hospital Stay: Payer: PPO

## 2020-05-14 ENCOUNTER — Telehealth: Payer: Self-pay

## 2020-05-14 ENCOUNTER — Inpatient Hospital Stay: Payer: PPO | Admitting: Hematology and Oncology

## 2020-05-14 VITALS — BP 109/56 | HR 90 | Temp 97.9°F | Resp 18 | Ht 64.0 in | Wt 154.4 lb

## 2020-05-14 DIAGNOSIS — N183 Chronic kidney disease, stage 3 unspecified: Secondary | ICD-10-CM | POA: Diagnosis not present

## 2020-05-14 DIAGNOSIS — G893 Neoplasm related pain (acute) (chronic): Secondary | ICD-10-CM

## 2020-05-14 DIAGNOSIS — C800 Disseminated malignant neoplasm, unspecified: Secondary | ICD-10-CM

## 2020-05-14 DIAGNOSIS — E1122 Type 2 diabetes mellitus with diabetic chronic kidney disease: Secondary | ICD-10-CM

## 2020-05-14 DIAGNOSIS — C569 Malignant neoplasm of unspecified ovary: Secondary | ICD-10-CM | POA: Diagnosis not present

## 2020-05-14 DIAGNOSIS — R180 Malignant ascites: Secondary | ICD-10-CM | POA: Diagnosis not present

## 2020-05-14 DIAGNOSIS — Z170 Estrogen receptor positive status [ER+]: Secondary | ICD-10-CM

## 2020-05-14 DIAGNOSIS — C561 Malignant neoplasm of right ovary: Secondary | ICD-10-CM | POA: Diagnosis not present

## 2020-05-14 DIAGNOSIS — D61818 Other pancytopenia: Secondary | ICD-10-CM | POA: Diagnosis not present

## 2020-05-14 DIAGNOSIS — Z95828 Presence of other vascular implants and grafts: Secondary | ICD-10-CM

## 2020-05-14 DIAGNOSIS — D630 Anemia in neoplastic disease: Secondary | ICD-10-CM

## 2020-05-14 DIAGNOSIS — R600 Localized edema: Secondary | ICD-10-CM

## 2020-05-14 LAB — CMP (CANCER CENTER ONLY)
ALT: 36 U/L (ref 0–44)
AST: 64 U/L — ABNORMAL HIGH (ref 15–41)
Albumin: 2.4 g/dL — ABNORMAL LOW (ref 3.5–5.0)
Alkaline Phosphatase: 235 U/L — ABNORMAL HIGH (ref 38–126)
Anion gap: 9 (ref 5–15)
BUN: 32 mg/dL — ABNORMAL HIGH (ref 8–23)
CO2: 21 mmol/L — ABNORMAL LOW (ref 22–32)
Calcium: 9 mg/dL (ref 8.9–10.3)
Chloride: 107 mmol/L (ref 98–111)
Creatinine: 1.44 mg/dL — ABNORMAL HIGH (ref 0.44–1.00)
GFR, Est AFR Am: 39 mL/min — ABNORMAL LOW (ref >60)
GFR, Estimated: 34 mL/min — ABNORMAL LOW (ref >60)
Glucose, Bld: 108 mg/dL — ABNORMAL HIGH (ref 70–99)
Potassium: 4.8 mmol/L (ref 3.5–5.1)
Sodium: 137 mmol/L (ref 135–145)
Total Bilirubin: 0.3 mg/dL (ref 0.3–1.2)
Total Protein: 5.9 g/dL — ABNORMAL LOW (ref 6.5–8.1)

## 2020-05-14 LAB — CBC WITH DIFFERENTIAL (CANCER CENTER ONLY)
Abs Immature Granulocytes: 0.01 10*3/uL (ref 0.00–0.07)
Basophils Absolute: 0 10*3/uL (ref 0.0–0.1)
Basophils Relative: 1 %
Eosinophils Absolute: 0.1 10*3/uL (ref 0.0–0.5)
Eosinophils Relative: 7 %
HCT: 30.9 % — ABNORMAL LOW (ref 36.0–46.0)
Hemoglobin: 9.9 g/dL — ABNORMAL LOW (ref 12.0–15.0)
Immature Granulocytes: 1 %
Lymphocytes Relative: 37 %
Lymphs Abs: 0.8 10*3/uL (ref 0.7–4.0)
MCH: 28 pg (ref 26.0–34.0)
MCHC: 32 g/dL (ref 30.0–36.0)
MCV: 87.3 fL (ref 80.0–100.0)
Monocytes Absolute: 0.6 10*3/uL (ref 0.1–1.0)
Monocytes Relative: 30 %
Neutro Abs: 0.5 10*3/uL — ABNORMAL LOW (ref 1.7–7.7)
Neutrophils Relative %: 24 %
Platelet Count: 187 10*3/uL (ref 150–400)
RBC: 3.54 MIL/uL — ABNORMAL LOW (ref 3.87–5.11)
RDW: 14.4 % (ref 11.5–15.5)
WBC Count: 2.2 10*3/uL — ABNORMAL LOW (ref 4.0–10.5)
nRBC: 0 % (ref 0.0–0.2)

## 2020-05-14 LAB — SAMPLE TO BLOOD BANK

## 2020-05-14 MED ORDER — HYDROMORPHONE HCL 2 MG PO TABS: 2.0000 mg | ORAL_TABLET | ORAL | 0 refills | Status: DC | PRN

## 2020-05-14 MED ORDER — SODIUM CHLORIDE 0.9% FLUSH
10.0000 mL | Freq: Once | INTRAVENOUS | Status: AC
  Administered 2020-05-14: 10 mL
  Filled 2020-05-14: qty 10

## 2020-05-14 MED ORDER — HEPARIN SOD (PORK) LOCK FLUSH 100 UNIT/ML IV SOLN
500.0000 [IU] | Freq: Once | INTRAVENOUS | Status: AC
  Administered 2020-05-14: 500 [IU]
  Filled 2020-05-14: qty 5

## 2020-05-14 NOTE — Telephone Encounter (Signed)
Called and given paracentesis appt for Wednesday 6/16 at 10 am. Instructed to arrive at 9:45 am at [facility]. She verbalized understanding.

## 2020-05-15 ENCOUNTER — Encounter: Payer: Self-pay | Admitting: Hematology and Oncology

## 2020-05-15 DIAGNOSIS — D61818 Other pancytopenia: Secondary | ICD-10-CM | POA: Insufficient documentation

## 2020-05-15 DIAGNOSIS — G893 Neoplasm related pain (acute) (chronic): Secondary | ICD-10-CM | POA: Insufficient documentation

## 2020-05-15 NOTE — Assessment & Plan Note (Signed)
Overall, her leg swelling has improved Observe closely for now

## 2020-05-15 NOTE — Assessment & Plan Note (Signed)
Her renal function is improved with aggressive IV fluid resuscitation I recommend increase oral intake as tolerated She does not need further IV fluids today

## 2020-05-15 NOTE — Assessment & Plan Note (Signed)
Her pancytopenia is due to treatment She is not symptomatic We discussed neutropenic precaution She does not need transfusion support

## 2020-05-15 NOTE — Assessment & Plan Note (Signed)
We discussed narcotic prescription for chronic cancer pain management She is in agreement to try

## 2020-05-15 NOTE — Progress Notes (Signed)
[place] OFFICE PROGRESS NOTE  Patient Care Team: [name], MD as PCP - Kinnie Scales, OD as Referring Physician (Optometry) [name], MD as Consulting Physician (Dermatology) Latanya Maudlin, MD as Consulting Physician (Orthopedic Surgery) Inda Castle, MD (Inactive) as Consulting Physician (Gastroenterology) Awanda Mink Craige Cotta, RN as Oncology Nurse Navigator (Oncology) Heath Lark, MD as Consulting Physician (Hematology and Oncology)  ASSESSMENT & PLAN:  Ovarian cancer [facility]) She tolerated recent chemo better. 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 She is symptomatic I recommend repeat paracentesis  CKD stage 3 due to type 2 diabetes mellitus ([place]) Her renal function is improved with aggressive IV fluid resuscitation I recommend increase oral intake as tolerated She does not need further IV fluids today  Pancytopenia, acquired ([place]) Her pancytopenia is due to treatment She is not symptomatic We discussed neutropenic precaution She does not need transfusion support  Leg edema, right Overall, her leg swelling has improved Observe closely for now  Cancer associated pain We discussed narcotic prescription for chronic cancer pain management She is in agreement to try   Orders Placed This Encounter  Procedures  . IR Paracentesis    Standing Status:   Future    Standing Expiration Date:   06/13/2020    Order Specific Question:   If therapeutic, is there a maximum amount of fluid to be removed?    Answer:   Yes    Order Specific Question:   What is the maximum amount of fluide to be removed?    Answer:   5  Liters    Order Specific Question:   Are labs required for specimen collection?    Answer:   No    Order Specific Question:   Is Albumin medication needed?    Answer:   No    Order Specific Question:   Reason for Exam (SYMPTOM  OR DIAGNOSIS REQUIRED)    Answer:   ovarian cancer, abdominal distension.    Order Specific Question:   Preferred Imaging Location?    Answer:   [facility]    Order Specific Question:   Release to patient    Answer:   Immediate    All questions were answered. The patient knows to call the clinic with any problems, questions or concerns. The total time spent in the appointment was 30 minutes encounter with patients including review of chart and various tests results, discussions about plan of care and coordination of care plan   Heath Lark, MD 05/15/2020 9:55 AM  INTERVAL HISTORY: Please see below for problem oriented charting. She returns with her husband for further follow-up She started to have symptomatic bloating again after recent paracentesis She is fairly discomfort with occasional intermittent abdominal pain Denies constipation No recent nausea Her leg swelling is stable No recent infection, fever or chills The patient denies any recent signs or symptoms of bleeding such as spontaneous epistaxis, hematuria or hematochezia. No worsening peripheral neuropathy  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[place])  02/26/2016 Initial Diagnosis   Left breast biopsy 11:00 position: invasive ductal carcinoma with DCIS, ER 90%, PR 10%, HER-2 negative, Ki-67 30%, grade  2, 2.2 cm palpable lesion T2 N0 stage II a clinical stage   03/18/2016 Surgery   Left lumpectomy: Invasive ductal carcinoma, grade 2, 6.3 cm, with high-grade DCIS, margins negative, 0/4 lymph nodes negative, ER 90%, via 10%, HER-2 negative ratio 0.97, Ki-67 30%, T3 N0 stage IIB   03/25/2016 Procedure   Genetic testing is  negative for pathogenic mutations within any of the 20 Genes on the breast/ovarian cancer panel   04/04/2016 Oncotype testing   Oncotype DX recurrence score 37, 25% 10 year distant risk of recurrence   04/17/2016 - 07/31/2016 Chemotherapy   Adjuvant chemotherapy with dose dense Adriamycin and Cytoxan followed by Abraxane weekly 8 ( discontinued due to neuropathy)   09/01/2016 - 09/26/2016 Radiation Therapy   Adj XRT 1) Left breast: 42.5 Gy in 17 fractions. 2) Left breast boost: 7.5 Gy in 3 fractions.   12/02/2016 -  Anti-estrogen oral therapy   Anastrozole 1 mg daily   Ovarian cancer ([place])  04/03/2020 Imaging   US pelvis Complex cystic and solid mass in LEFT adnexa 12.5 cm diameter question cystic ovarian neoplasm; recommend correlation with serum tumor markers and further evaluation by MR imaging with and without contrast.     04/04/2020 Imaging   US venous Doppler No evidence of deep venous thrombosis in the right lower extremity. Left common femoral vein also patent.   04/15/2020 Imaging   MRI pelvis 1. Large complex solid and cystic mass arising from the right adnexa measuring 10.8 by 11.5 by 11.1 cm. This has an aggressive appearance with extensive enhancing mural soft tissue components. Findings are highly suspicious for malignant ovarian neoplasm. 2. Extensive bilateral retroperitoneal and bilateral iliac adenopathy compatible with metastatic disease. 3. Signs of extensive peritoneal carcinomatosis including ascites, enhancement, thickening and nodularity of the peritoneal reflections, omental caking and bulky peritoneal nodularity. 4. Suspected serosal involvement of the dome of bladder with loss of normal fat plane. Cannot rule out mural invasion by tumor.   04/17/2020 Tumor Marker   Patient's tumor was tested for the following markers: CA-125 Results of the tumor marker test revealed 1762.   04/20/2020 Cancer Staging   Staging form: Ovary, Fallopian Tube, and Primary Peritoneal  Carcinoma, AJCC 8th Edition - Clinical: FIGO Stage IIIC (cT3, cN1, cM0) - Signed by Heath Lark, MD on 04/20/2020   04/23/2020 Imaging   1. Redemonstrated dominant mixed solid and cystic mass arising from the vicinity of the right ovary measuring at least 11.5 x 10.7 cm, not significantly changed compared to prior MR, and consistent with primary ovarian malignancy.  2. Numerous bulky retroperitoneal, bilateral iliac, and pelvic sidewall lymph nodes. 3. Moderate volume ascites throughout the abdomen and pelvis with subtle thickening and nodularity throughout the peritoneum, and extensive bulky nodular metastatic disease of the omentum. 4. Constellation of findings is consistent with advanced nodal and peritoneal metastatic disease. 5. There are prominent subcentimeter epicardial lymph nodes, nonspecific although suspicious for nodal metastatic disease. No definite nodal metastatic disease in the chest. Attention on follow-up. 6. There are multiple small subpleural nodules at the right lung base overlying the diaphragm, measuring up to 7 mm. These are generally nonspecific and less favored to represent pulmonary metastatic disease given distribution. Attention on follow-up. 7. Somewhat coarse contour of the liver, suggestive of cirrhosis, although out without overt morphologic stigmata. 8. Aortic Atherosclerosis (ICD10-I70.0).     04/24/2020 Procedure   Successful placement of a right internal jugular approach power injectable Port-A-Cath. The catheter is ready for immediate use.  05/02/2020 Tumor Marker   Patient's tumor was tested for the following markers: CA-125 Results of the tumor marker test revealed [id number]   05/03/2020 -  Chemotherapy   The patient had carboplatin and taxol for chemotherapy treatment.     05/08/2020 Procedure   Successful ultrasound-guided paracentesis yielding 2.6 liters of peritoneal fluid.     REVIEW OF SYSTEMS:   Constitutional: Denies fevers, chills or abnormal  weight loss Eyes: Denies blurriness of vision Ears, nose, mouth, throat, and face: Denies mucositis or sore throat Respiratory: Denies cough, dyspnea or wheezes Cardiovascular: Denies palpitation, chest discomfort  Skin: Denies abnormal skin rashes Lymphatics: Denies new lymphadenopathy or easy bruising Neurological:Denies numbness, tingling or new weaknesses Behavioral/Psych: Mood is stable, no new changes  All other systems were reviewed with the patient and are negative.  I have reviewed the past medical history, past surgical history, social history and family history with the patient and they are unchanged from previous note.  ALLERGIES:  is allergic to keflex [cephalexin], meloxicam, prednisone, premarin [estrogens conjugated], and trovan [alatrofloxacin].  MEDICATIONS:  Current Outpatient Medications  Medication Sig Dispense Refill  . acetaminophen (TYLENOL) 325 MG tablet Take 325 mg by mouth every 6 (six) hours as needed for headache.    . allopurinol (ZYLOPRIM) 300 MG tablet Take 1/2 tablet Daily to Prevent Gout 90 tablet 3  . blood glucose meter kit and supplies KIT Dispense based on patient and insurance preference. Use to check blood sugar once daily. Dx: E11.22, N18.30 1 each 12  . Cholecalciferol (VITAMIN D3) 5000 units CAPS Take 5,000 Units by mouth daily.    Marland Kitchen dexamethasone (DECADRON) 4 MG tablet Take 2 tabs at the night before and 2 tab the morning of chemotherapy, every 3 weeks, by mouth 36 tablet 5  . gabapentin (NEURONTIN) 300 MG capsule Take 1 capsule 3 to 4  x /day as needed for pain (Patient taking differently: Take 300 mg by mouth See admin instructions. Take 300 mg 3 times daily may take a 4th 300 mg dose as needed for pain) 360 capsule 3  . glimepiride (AMARYL) 1 MG tablet Take 1 tab by mouth in the morning if fasting is running 150+. Please do not take this medication if you are ill or not eating as this can cause a low blood sugar. 90 tablet 1  . glucose blood  (FREESTYLE TEST STRIPS) test strip Check Blood Sugar Daily as directed (Dx: E11.29) 100 strip 3  . HYDROmorphone (DILAUDID) 2 MG tablet Take 1 tablet (2 mg total) by mouth every 4 (four) hours as needed for severe pain. 30 tablet 0  . Lancets (FREESTYLE) lancets CHECK BLOOD SUGAR DAILY AS DIRECTED 100 each 3  . lidocaine-prilocaine (EMLA) cream Apply to affected area once (Patient taking differently: Apply 1 application topically daily as needed (port access). ) 30 g 3  . loratadine (CLARITIN) 10 MG tablet Take 10 mg by mouth daily as needed for allergies.    . Magnesium ([employer] MAGNESIUM) 250 MG TABS Take 250 mg by mouth 2 (two) times daily.    . metoprolol succinate (TOPROL-XL) 25 MG 24 hr tablet Take 1 tablet Daily for BP (Patient taking differently: Take 25 mg by mouth daily. ) 90 tablet 3  . mirabegron ER (MYRBETRIQ) 50 MG TB24 tablet Take 1 tablet (50 mg total) by mouth daily. 30 tablet 2  . ondansetron (ZOFRAN) 8 MG tablet Take 1 tablet (8 mg total) by mouth 2 (two) times daily as needed for refractory nausea /  vomiting. Start on day 3 after carboplatin chemo. (Patient not taking: Reported on 05/09/2020) 30 tablet 1  . prochlorperazine (COMPAZINE) 10 MG tablet Take 1 tablet (10 mg total) by mouth every 6 (six) hours as needed for nausea or vomiting. (Patient not taking: Reported on 05/09/2020) 60 tablet 9  . vitamin C (ASCORBIC ACID) 500 MG tablet Take 500 mg by mouth every evening.      No current facility-administered medications for this visit.    PHYSICAL EXAMINATION: ECOG PERFORMANCE STATUS: 2 - Symptomatic, <50% confined to bed  Vitals:   05/14/20 1252  BP: (!) 109/56  Pulse: 90  Resp: 18  Temp: 97.9 F (36.6 C)  SpO2: 100%   Filed Weights   05/14/20 1252  Weight: 154 lb 6.4 oz (70 kg)    GENERAL:alert, no distress and comfortable SKIN: skin color, texture, turgor are normal, no rashes or significant lesions EYES: normal, Conjunctiva are pink and non-injected, sclera  clear OROPHARYNX:no exudate, no erythema and lips, buccal mucosa, and tongue normal  NECK: supple, thyroid normal size, non-tender, without nodularity LYMPH:  no palpable lymphadenopathy in the cervical, axillary or inguinal LUNGS: clear to auscultation and percussion with normal breathing effort HEART: Mild tachycardia, no murmur, persistent right lower extremity edema ABDOMEN:abdomen soft, distended with ascites with palpable persistent carcinomatosis  musculoskeletal:no cyanosis of digits and no clubbing  NEURO: alert & oriented x 3 with fluent speech, no focal motor/sensory deficits  LABORATORY DATA:  I have reviewed the data as listed    Component Value Date/Time   NA 137 05/14/2020 1238   NA 141 11/06/2016 1102   K 4.8 05/14/2020 1238   K 3.9 11/06/2016 1102   CL 107 05/14/2020 1238   CO2 21 (L) 05/14/2020 1238   CO2 27 11/06/2016 1102   GLUCOSE 108 (H) 05/14/2020 1238   GLUCOSE 130 11/06/2016 1102   BUN 32 (H) 05/14/2020 1238   BUN 18.0 11/06/2016 1102   CREATININE 1.44 (H) 05/14/2020 1238   CREATININE 1.48 (H) 05/09/2020 1115   CREATININE 1.3 (H) 11/06/2016 1102   CALCIUM 9.0 05/14/2020 1238   CALCIUM 10.2 11/06/2016 1102   PROT 5.9 (L) 05/14/2020 1238   PROT 7.1 11/06/2016 1102   ALBUMIN 2.4 (L) 05/14/2020 1238   ALBUMIN 3.7 11/06/2016 1102   AST 64 (H) 05/14/2020 1238   AST 52 (H) 11/06/2016 1102   ALT 36 05/14/2020 1238   ALT 58 (H) 11/06/2016 1102   ALKPHOS 235 (H) 05/14/2020 1238   ALKPHOS 159 (H) 11/06/2016 1102   BILITOT 0.3 05/14/2020 1238   BILITOT 0.53 11/06/2016 1102   GFRNONAA 34 (L) 05/14/2020 1238   GFRNONAA 33 (L) 05/09/2020 1115   GFRAA 39 (L) 05/14/2020 1238   GFRAA 38 (L) 05/09/2020 1115    No results found for: SPEP, UPEP  Lab Results  Component Value Date   WBC 2.2 (L) 05/14/2020   NEUTROABS 0.5 (L) 05/14/2020   HGB 9.9 (L) 05/14/2020   HCT 30.9 (L) 05/14/2020   MCV 87.3 05/14/2020   PLT 187 05/14/2020      Chemistry       Component Value Date/Time   NA 137 05/14/2020 1238   NA 141 11/06/2016 1102   K 4.8 05/14/2020 1238   K 3.9 11/06/2016 1102   CL 107 05/14/2020 1238   CO2 21 (L) 05/14/2020 1238   CO2 27 11/06/2016 1102   BUN 32 (H) 05/14/2020 1238   BUN 18.0 11/06/2016 1102   CREATININE 1.44 (H) 05/14/2020  1238   CREATININE 1.48 (H) 05/09/2020 1115   CREATININE 1.3 (H) 11/06/2016 1102      Component Value Date/Time   CALCIUM 9.0 05/14/2020 1238   CALCIUM 10.2 11/06/2016 1102   ALKPHOS 235 (H) 05/14/2020 1238   ALKPHOS 159 (H) 11/06/2016 1102   AST 64 (H) 05/14/2020 1238   AST 52 (H) 11/06/2016 1102   ALT 36 05/14/2020 1238   ALT 58 (H) 11/06/2016 1102   BILITOT 0.3 05/14/2020 1238   BILITOT 0.53 11/06/2016 1102       RADIOGRAPHIC STUDIES: I have personally reviewed the radiological images as listed and agreed with the findings in the report. CT CHEST W CONTRAST  Result Date: 04/23/2020 CLINICAL DATA:  Ovarian cancer staging EXAM: CT CHEST, ABDOMEN, AND PELVIS WITH CONTRAST TECHNIQUE: Multidetector CT imaging of the chest, abdomen and pelvis was performed following the standard protocol during bolus administration of intravenous contrast. CONTRAST:  50m OMNIPAQUE IOHEXOL 300 MG/ML SOLN, additional oral enteric contrast COMPARISON:  MR pelvis, 04/15/2020 FINDINGS: CT CHEST FINDINGS Cardiovascular: Aortic atherosclerosis. Normal heart size. No pericardial effusion. Mediastinum/Nodes: There are prominent subcentimeter epicardial lymph nodes adjacent to the right atrium (series 2, image 45). No other enlarged mediastinal, hilar, or axillary lymph nodes. Thyroid gland, trachea, and esophagus demonstrate no significant findings. Lungs/Pleura: There are multiple small subpleural nodules at the right lung base overlying the diaphragm, measuring up to 7 mm (series 6, image 102, no pleural effusion or pneumothorax. Musculoskeletal: No chest wall mass or suspicious bone lesions identified. Postoperative  findings of left lumpectomy and axillary lymph node dissection. CT ABDOMEN PELVIS FINDINGS Hepatobiliary: Somewhat coarse contour of the liver. No gallstones, gallbladder wall thickening, or biliary dilatation. Pancreas: Unremarkable. No pancreatic ductal dilatation or surrounding inflammatory changes. Spleen: Normal in size without significant abnormality. Adrenals/Urinary Tract: Adrenal glands are unremarkable. Kidneys are normal, without renal calculi, solid lesion, or hydronephrosis. Bladder is unremarkable. Stomach/Bowel: Stomach is within normal limits. Appendix appears normal. No evidence of bowel wall thickening, distention, or inflammatory changes. Vascular/Lymphatic: No significant vascular findings are present. Numerous bulky retroperitoneal, bilateral iliac, and pelvic sidewall lymph nodes, an index aortocaval node measuring 2.7 x 2.1 cm (series 2, image 78). Reproductive: Redemonstrated dominant mixed solid and cystic mass arising from the vicinity of the right ovary measuring at least 11.5 x 10.7 cm, not significantly changed compared to prior MR (series 2, image 104). Status post hysterectomy. Other: No abdominal wall hernia or abnormality. Moderate volume ascites throughout the abdomen and pelvis with subtle thickening and nodularity throughout the peritoneum, and extensive bulky nodular metastatic disease of the omentum (series 2, image 102). Musculoskeletal: No acute or significant osseous findings. IMPRESSION: 1. Redemonstrated dominant mixed solid and cystic mass arising from the vicinity of the right ovary measuring at least 11.5 x 10.7 cm, not significantly changed compared to prior MR, and consistent with primary ovarian malignancy. 2. Numerous bulky retroperitoneal, bilateral iliac, and pelvic sidewall lymph nodes. 3. Moderate volume ascites throughout the abdomen and pelvis with subtle thickening and nodularity throughout the peritoneum, and extensive bulky nodular metastatic disease of the  omentum. 4. Constellation of findings is consistent with advanced nodal and peritoneal metastatic disease. 5. There are prominent subcentimeter epicardial lymph nodes, nonspecific although suspicious for nodal metastatic disease. No definite nodal metastatic disease in the chest. Attention on follow-up. 6. There are multiple small subpleural nodules at the right lung base overlying the diaphragm, measuring up to 7 mm. These are generally nonspecific and less favored to represent pulmonary  metastatic disease given distribution. Attention on follow-up. 7. Somewhat coarse contour of the liver, suggestive of cirrhosis, although out without overt morphologic stigmata. 8. Aortic Atherosclerosis (ICD10-I70.0). Electronically Signed   By: Eddie Candle M.D.   On: 04/23/2020 08:43   MR Pelvis W Wo Contrast  Result Date: 04/15/2020 CLINICAL DATA:  Staging ovarian cancer. EXAM: MRI PELVIS WITHOUT AND WITH CONTRAST TECHNIQUE: Multiplanar multisequence MR imaging of the pelvis was performed both before and after administration of intravenous contrast. CONTRAST:  73m MULTIHANCE GADOBENATE DIMEGLUMINE 529 MG/ML IV SOLN COMPARISON:  Ultrasound dated 04/03/2020 FINDINGS: Urinary Tract: Large pelvic mass and bulky peritoneal disease has mass effect upon the dome of bladder. Loss of fat plane along the dome of bladder and peritoneal lesions noted. Cannot rule out serosal involvement of the bladder dome. Bowel: Imaged portions of the colon and rectum. No pathologically enlarged small or large bowel loops identified at this time to suggest a bowel obstruction. Vascular/Lymphatic: No significant vascular abnormality identified. Extensive bilateral common iliac and external iliac adenopathy is identified. Left common iliac lymph node measures 1.7 cm, image number 1/9 Right common iliac lymph node measures 1.3 cm, image 5/9. Right pelvic sidewall lymph node measures 1.7 cm, image 82/9. Left external iliac lymph node measures 1.2 cm,  image 52/9 Bulky retroperitoneal adenopathy is identified within the abdomen as seen on the postcontrast coronal images. This includes a left retroperitoneal node with a short axis of 1.7 cm, image 69/11. Reproductive: Uterus: Surgically absent. Right ovary: Not seen. Large complex solid and cystic mass with enhancing soft tissue components is identified within pelvis in appears to arise from the right adnexa. This measures 10.8 by 11.5 by 11.1 cm, image 51/9 and image 22/3. This has an aggressive appearance with extensive enhancing mural soft tissue. Highly concerning for malignant ovarian neoplasm. Left ovary: Measures 1.5 x 3.1 x 3.6 cm. This contains 3 simple appearing unilocular cysts which measure up to 1.9 cm, image 77/9 and image 15/3. Other:  Ascites is identified within the abdomen and pelvis. Bulky peritoneal disease with extensive caking of the omentum is identified compatible with peritoneal carcinomatosis. Omental tumor along the anterior midline of abdomen measures 3.5 x 2.2 cm, image 8/6. Tumor implant along the right pericolic gutter measures 3.4 x 3.9 cm, image 23/3. There is diffuse enhancement and nodularity involving the peritoneal reflections. Musculoskeletal: No suspicious enhancing bone lesions. IMPRESSION: 1. Large complex solid and cystic mass arising from the right adnexa measuring 10.8 by 11.5 by 11.1 cm. This has an aggressive appearance with extensive enhancing mural soft tissue components. Findings are highly suspicious for malignant ovarian neoplasm. 2. Extensive bilateral retroperitoneal and bilateral iliac adenopathy compatible with metastatic disease. 3. Signs of extensive peritoneal carcinomatosis including ascites, enhancement, thickening and nodularity of the peritoneal reflections, omental caking and bulky peritoneal nodularity. 4. Suspected serosal involvement of the dome of bladder with loss of normal fat plane. Cannot rule out mural invasion by tumor. Electronically Signed    By: TKerby MoorsM.D.   On: 04/15/2020 15:18   CT Abdomen Pelvis W Contrast  Result Date: 04/23/2020 CLINICAL DATA:  Ovarian cancer staging EXAM: CT CHEST, ABDOMEN, AND PELVIS WITH CONTRAST TECHNIQUE: Multidetector CT imaging of the chest, abdomen and pelvis was performed following the standard protocol during bolus administration of intravenous contrast. CONTRAST:  727mOMNIPAQUE IOHEXOL 300 MG/ML SOLN, additional oral enteric contrast COMPARISON:  MR pelvis, 04/15/2020 FINDINGS: CT CHEST FINDINGS Cardiovascular: Aortic atherosclerosis. Normal heart size. No pericardial effusion. Mediastinum/Nodes: There are  prominent subcentimeter epicardial lymph nodes adjacent to the right atrium (series 2, image 45). No other enlarged mediastinal, hilar, or axillary lymph nodes. Thyroid gland, trachea, and esophagus demonstrate no significant findings. Lungs/Pleura: There are multiple small subpleural nodules at the right lung base overlying the diaphragm, measuring up to 7 mm (series 6, image 102, no pleural effusion or pneumothorax. Musculoskeletal: No chest wall mass or suspicious bone lesions identified. Postoperative findings of left lumpectomy and axillary lymph node dissection. CT ABDOMEN PELVIS FINDINGS Hepatobiliary: Somewhat coarse contour of the liver. No gallstones, gallbladder wall thickening, or biliary dilatation. Pancreas: Unremarkable. No pancreatic ductal dilatation or surrounding inflammatory changes. Spleen: Normal in size without significant abnormality. Adrenals/Urinary Tract: Adrenal glands are unremarkable. Kidneys are normal, without renal calculi, solid lesion, or hydronephrosis. Bladder is unremarkable. Stomach/Bowel: Stomach is within normal limits. Appendix appears normal. No evidence of bowel wall thickening, distention, or inflammatory changes. Vascular/Lymphatic: No significant vascular findings are present. Numerous bulky retroperitoneal, bilateral iliac, and pelvic sidewall lymph nodes,  an index aortocaval node measuring 2.7 x 2.1 cm (series 2, image 78). Reproductive: Redemonstrated dominant mixed solid and cystic mass arising from the vicinity of the right ovary measuring at least 11.5 x 10.7 cm, not significantly changed compared to prior MR (series 2, image 104). Status post hysterectomy. Other: No abdominal wall hernia or abnormality. Moderate volume ascites throughout the abdomen and pelvis with subtle thickening and nodularity throughout the peritoneum, and extensive bulky nodular metastatic disease of the omentum (series 2, image 102). Musculoskeletal: No acute or significant osseous findings. IMPRESSION: 1. Redemonstrated dominant mixed solid and cystic mass arising from the vicinity of the right ovary measuring at least 11.5 x 10.7 cm, not significantly changed compared to prior MR, and consistent with primary ovarian malignancy. 2. Numerous bulky retroperitoneal, bilateral iliac, and pelvic sidewall lymph nodes. 3. Moderate volume ascites throughout the abdomen and pelvis with subtle thickening and nodularity throughout the peritoneum, and extensive bulky nodular metastatic disease of the omentum. 4. Constellation of findings is consistent with advanced nodal and peritoneal metastatic disease. 5. There are prominent subcentimeter epicardial lymph nodes, nonspecific although suspicious for nodal metastatic disease. No definite nodal metastatic disease in the chest. Attention on follow-up. 6. There are multiple small subpleural nodules at the right lung base overlying the diaphragm, measuring up to 7 mm. These are generally nonspecific and less favored to represent pulmonary metastatic disease given distribution. Attention on follow-up. 7. Somewhat coarse contour of the liver, suggestive of cirrhosis, although out without overt morphologic stigmata. 8. Aortic Atherosclerosis (ICD10-I70.0). Electronically Signed   By: Eddie Candle M.D.   On: 04/23/2020 08:43   CT ASPIRATION  Result  Date: 04/24/2020 INDICATION: History of breast cancer, now with findings worrisome for metastatic gynecologic malignancy. Please perform CT-guided biopsy of dominant omental mass/caking within the right mid hemiabdomen. Additionally, please perform ultrasound-guided paracentesis for diagnostic and therapeutic purposes. EXAM: 1. IMAGE GUIDED BIOPSY OF OMENTAL MASS WITHIN THE RIGHT MID HEMIABDOMEN 2. IMAGE GUIDED DIAGNOSTIC AND THERAPEUTIC PARACENTESIS. COMPARISON:  CT the chest, abdomen and pelvis-04/23/2020 MEDICATIONS: None. ANESTHESIA/SEDATION: Fentanyl 25 mcg IV; Versed 1 mg IV Sedation time: 27 minutes; The patient was continuously monitored during the procedure by the interventional radiology nurse under my direct supervision. CONTRAST:  None. COMPLICATIONS: None immediate. PROCEDURE: Informed consent was obtained from the patient following an explanation of the procedure, risks, benefits and alternatives. A time out was performed prior to the initiation of the procedure. The patient was positioned supine on the CT table  and a limited CT was performed for procedural planning demonstrating unchanged size and appearance of the mental for caking within the ventral aspect of the lower abdomen with dominant omental mass measuring approximately 4.5 x 4.0 cm (image 30, series 2). Note was also made of small amount of predominantly perihepatic intra-abdominal ascites. Both the dominant omental mass within ventral aspect the right mid hemiabdomen as well as the adjacent ascitic fluid were identified sonographically. Multiple ultrasound images were saved procedural documentation purposes. The procedures were planned. The operative sites were prepped and draped in the usual sterile fashion. Under direct ultrasound guidance, dominant perihepatic fluid collection was accessed with a 5 [place] Yueh sheath catheter needle and a diagnostic/therapeutic paracentesis was performed. Next, under direct ultrasound guidance, dominant  omental mass within the ventral aspect of the right mid/lateral abdomen was targeted with a 17 gauge coaxial needle. Appropriate position was confirmed with CT imaging. Next, under direct ultrasound guidance, 6 core needle biopsies were obtained with an 18 gauge core needle biopsy device. The coaxial needle was withdrawn superficial hemostasis was achieved with manual compression. The paracentesis was completed ultimately yielding 600 cc of bloody ascitic fluid. A representative sample was capped and sent to the laboratory for cytologic analysis. A limited postprocedural CT was negative for hemorrhage or additional complication. A dressing was placed. The patient tolerated the procedure well without immediate postprocedural complication. IMPRESSION: 1. Technically successful ultrasound and CT-guided core needle biopsy of dominant omental mass within the lateral ventral aspect of the right mid hemiabdomen. 2. Technically successful ultrasound and CT guided paracentesis yielding a total of 600 cc of bloody ascitic fluid. A representative sample was capped and sent to the laboratory for cytologic analysis. PLAN: Patient was subsequently transferred to the interventional radiology suite and underwent Port a catheter placement. Electronically Signed   By: Sandi Mariscal M.D.   On: 04/24/2020 11:16   CT BIOPSY  Result Date: 04/24/2020 INDICATION: History of breast cancer, now with findings worrisome for metastatic gynecologic malignancy. Please perform CT-guided biopsy of dominant omental mass/caking within the right mid hemiabdomen. Additionally, please perform ultrasound-guided paracentesis for diagnostic and therapeutic purposes. EXAM: 1. IMAGE GUIDED BIOPSY OF OMENTAL MASS WITHIN THE RIGHT MID HEMIABDOMEN 2. IMAGE GUIDED DIAGNOSTIC AND THERAPEUTIC PARACENTESIS. COMPARISON:  CT the chest, abdomen and pelvis-04/23/2020 MEDICATIONS: None. ANESTHESIA/SEDATION: Fentanyl 25 mcg IV; Versed 1 mg IV Sedation time: 27 minutes;  The patient was continuously monitored during the procedure by the interventional radiology nurse under my direct supervision. CONTRAST:  None. COMPLICATIONS: None immediate. PROCEDURE: Informed consent was obtained from the patient following an explanation of the procedure, risks, benefits and alternatives. A time out was performed prior to the initiation of the procedure. The patient was positioned supine on the CT table and a limited CT was performed for procedural planning demonstrating unchanged size and appearance of the mental for caking within the ventral aspect of the lower abdomen with dominant omental mass measuring approximately 4.5 x 4.0 cm (image 30, series 2). Note was also made of small amount of predominantly perihepatic intra-abdominal ascites. Both the dominant omental mass within ventral aspect the right mid hemiabdomen as well as the adjacent ascitic fluid were identified sonographically. Multiple ultrasound images were saved procedural documentation purposes. The procedures were planned. The operative sites were prepped and draped in the usual sterile fashion. Under direct ultrasound guidance, dominant perihepatic fluid collection was accessed with a 5 [place] Yueh sheath catheter needle and a diagnostic/therapeutic paracentesis was performed. Next, under direct ultrasound guidance,  dominant omental mass within the ventral aspect of the right mid/lateral abdomen was targeted with a 17 gauge coaxial needle. Appropriate position was confirmed with CT imaging. Next, under direct ultrasound guidance, 6 core needle biopsies were obtained with an 18 gauge core needle biopsy device. The coaxial needle was withdrawn superficial hemostasis was achieved with manual compression. The paracentesis was completed ultimately yielding 600 cc of bloody ascitic fluid. A representative sample was capped and sent to the laboratory for cytologic analysis. A limited postprocedural CT was negative for hemorrhage or  additional complication. A dressing was placed. The patient tolerated the procedure well without immediate postprocedural complication. IMPRESSION: 1. Technically successful ultrasound and CT-guided core needle biopsy of dominant omental mass within the lateral ventral aspect of the right mid hemiabdomen. 2. Technically successful ultrasound and CT guided paracentesis yielding a total of 600 cc of bloody ascitic fluid. A representative sample was capped and sent to the laboratory for cytologic analysis. PLAN: Patient was subsequently transferred to the interventional radiology suite and underwent Port a catheter placement. Electronically Signed   By: Sandi Mariscal M.D.   On: 04/24/2020 11:16   [place] ASCITES (ABDOMEN LIMITED)  Result Date: 04/23/2020 CLINICAL DATA:  Ovarian mass, peritoneal masses and ascites. EXAM: LIMITED ABDOMEN ULTRASOUND FOR ASCITES TECHNIQUE: Limited ultrasound survey for ascites was performed in all four abdominal quadrants. COMPARISON:  CT of the abdomen and pelvis on 04/23/2020 FINDINGS: Survey of the peritoneal cavity shows relatively small amount of ascites scattered throughout the peritoneal cavity. A large enough pocket was not present to allow for safe paracentesis today. The patient is scheduled for a CT-guided biopsy of peritoneal mass later this week. IMPRESSION: Relatively small amount of scattered ascites in the peritoneal cavity. A large enough volume of fluid was not present to allow for paracentesis today. Electronically Signed   By: Aletta Edouard M.D.   On: 04/23/2020 13:07   IR IMAGING GUIDED PORT INSERTION  Result Date: 04/24/2020 INDICATION: History of breast cancer, now with findings worrisome for metastatic gynecologic malignancy. Patient in need of durable intravenous access for chemotherapy administration. EXAM: IMPLANTED PORT A CATH PLACEMENT WITH ULTRASOUND AND FLUOROSCOPIC GUIDANCE COMPARISON:  Chest CT-04/23/2020 MEDICATIONS: Vancomycin 1 gm IV; The antibiotic  was administered within an appropriate time interval prior to skin puncture. ANESTHESIA/SEDATION: Moderate (conscious) sedation was employed during this procedure. A total of Versed 1 mg and Fentanyl 25 mcg was administered intravenously. Moderate Sedation Time: 23 minutes. The patient's level of consciousness and vital signs were monitored continuously by radiology nursing throughout the procedure under my direct supervision. CONTRAST:  None FLUOROSCOPY TIME:  24 seconds (1 mGy) COMPLICATIONS: None immediate. PROCEDURE: The procedure, risks, benefits, and alternatives were explained to the patient. Questions regarding the procedure were encouraged and answered. The patient understands and consents to the procedure. The right neck and chest were prepped with chlorhexidine in a sterile fashion, and a sterile drape was applied covering the operative field. Maximum barrier sterile technique with sterile gowns and gloves were used for the procedure. A timeout was performed prior to the initiation of the procedure. Local anesthesia was provided with 1% lidocaine with epinephrine. After creating a small venotomy incision, a micropuncture kit was utilized to access the internal jugular vein. Real-time ultrasound guidance was utilized for vascular access including the acquisition of a permanent ultrasound image documenting patency of the accessed vessel. The microwire was utilized to measure appropriate catheter length. A subcutaneous port pocket was then created along the upper chest wall utilizing  a combination of sharp and blunt dissection. The pocket was irrigated with sterile saline. A single lumen "Slim" sized power injectable port was chosen for placement. The 8 Fr catheter was tunneled from the port pocket site to the venotomy incision. The port was placed in the pocket. The external catheter was trimmed to appropriate length. At the venotomy, an 8 Fr peel-away sheath was placed over a guidewire under fluoroscopic  guidance. The catheter was then placed through the sheath and the sheath was removed. Final catheter positioning was confirmed and documented with a fluoroscopic spot radiograph. The port was accessed with a Huber needle, aspirated and flushed with heparinized saline. The venotomy site was closed with an interrupted 4-0 Vicryl suture. The port pocket incision was closed with interrupted 2-0 Vicryl suture. The skin was opposed with a running subcuticular 4-0 Vicryl suture. Dermabond and Steri-strips were applied to both incisions. Dressings were applied. The patient tolerated the procedure well without immediate post procedural complication. FINDINGS: After catheter placement, the tip lies within the superior cavoatrial junction. The catheter aspirates and flushes normally and is ready for immediate use. IMPRESSION: Successful placement of a right internal jugular approach power injectable Port-A-Cath. The catheter is ready for immediate use. Electronically Signed   By: Sandi Mariscal M.D.   On: 04/24/2020 11:10   IR Paracentesis  Result Date: 05/08/2020 INDICATION: Patient with ovarian cancer, new onset abdominal distension. Request to IR for therapeutic paracentesis with 5 L maximum. EXAM: ULTRASOUND GUIDED THERAPEUTIC PARACENTESIS MEDICATIONS: 8 mL 1% lidocaine COMPLICATIONS: None immediate. PROCEDURE: Informed written consent was obtained from the patient after a discussion of the risks, benefits and alternatives to treatment. A timeout was performed prior to the initiation of the procedure. Initial ultrasound scanning demonstrates a moderate amount of ascites within the right upper abdominal quadrant. The right upper abdomen was prepped and draped in the usual sterile fashion. 1% lidocaine was used for local anesthesia. Following this, a 19 gauge, 7-cm, Yueh catheter was introduced. An ultrasound image was saved for documentation purposes. The paracentesis was performed. The catheter was removed and a dressing  was applied. The patient tolerated the procedure well without immediate post procedural complication. FINDINGS: A total of approximately 2.6 L of serosanguineous fluid was removed. IMPRESSION: Successful ultrasound-guided paracentesis yielding 2.6 liters of peritoneal fluid. Read by Candiss Norse, PA-C Electronically Signed   By: Aletta Edouard M.D.   On: 05/08/2020 09:59

## 2020-05-15 NOTE — Assessment & Plan Note (Signed)
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

## 2020-05-15 NOTE — Assessment & Plan Note (Signed)
She tolerated recent chemo better. However, she felt somewhat bloated with distention of her abdomen On examination, in addition to persistent carcinomatosis, I felt that her abdominal distention is consistent with malignant ascites I will order repeat paracentesis for symptomatic relief I plan to see her again next week for further follow-up

## 2020-05-16 ENCOUNTER — Ambulatory Visit (HOSPITAL_COMMUNITY)
Admission: RE | Admit: 2020-05-16 | Discharge: 2020-05-16 | Disposition: A | Discharge status: Home / Self Care | Payer: PPO | Source: Ambulatory Visit | Attending: Hematology and Oncology | Admitting: Hematology and Oncology

## 2020-05-16 ENCOUNTER — Other Ambulatory Visit: Payer: Self-pay

## 2020-05-16 DIAGNOSIS — C569 Malignant neoplasm of unspecified ovary: Secondary | ICD-10-CM | POA: Insufficient documentation

## 2020-05-16 DIAGNOSIS — R180 Malignant ascites: Secondary | ICD-10-CM | POA: Diagnosis not present

## 2020-05-16 DIAGNOSIS — C800 Disseminated malignant neoplasm, unspecified: Secondary | ICD-10-CM

## 2020-05-16 HISTORY — PX: IR PARACENTESIS: IMG2679

## 2020-05-16 MED ORDER — LIDOCAINE HCL (PF) 1 % IJ SOLN
INTRAMUSCULAR | Status: AC | PRN
  Administered 2020-05-16: 10 mL

## 2020-05-16 MED ORDER — LIDOCAINE HCL 1 % IJ SOLN
INTRAMUSCULAR | Status: AC
  Filled 2020-05-16: qty 20

## 2020-05-16 NOTE — Procedures (Signed)
PROCEDURE SUMMARY:  Successful US guided paracentesis from left lateral abdomen.  Yielded 3.2 L of clear yellow fluid.  No immediate complications.  Pt tolerated well.   EBL < 27m  JTheresa Duty NP 05/16/2020 11:10 AM

## 2020-05-17 ENCOUNTER — Telehealth: Payer: Self-pay

## 2020-05-17 NOTE — Telephone Encounter (Signed)
Called back and given below message. She had the paracentesis yesterday and her pain is much better after that. She is up moving around more. She is only taking tylenol prn for pain. She has not started the Dilaudid, but she did fill the Rx and will take if needed.

## 2020-05-17 NOTE — Telephone Encounter (Signed)
-----   Message from Heath Lark, MD sent at 05/17/2020  8:23 AM EDT ----- Regarding: can you call how she is doing with pain managment

## 2020-05-17 NOTE — Telephone Encounter (Signed)
Called and left below message. Ask her to call the office back.

## 2020-05-24 ENCOUNTER — Inpatient Hospital Stay: Payer: PPO | Admitting: Hematology and Oncology

## 2020-05-24 ENCOUNTER — Encounter: Payer: Self-pay | Admitting: Hematology and Oncology

## 2020-05-24 ENCOUNTER — Inpatient Hospital Stay: Payer: PPO

## 2020-05-24 ENCOUNTER — Other Ambulatory Visit: Payer: Self-pay

## 2020-05-24 VITALS — BP 132/62 | HR 90 | Temp 97.8°F | Resp 16 | Ht 64.0 in | Wt 149.4 lb

## 2020-05-24 DIAGNOSIS — Z170 Estrogen receptor positive status [ER+]: Secondary | ICD-10-CM

## 2020-05-24 DIAGNOSIS — N183 Chronic kidney disease, stage 3 unspecified: Secondary | ICD-10-CM

## 2020-05-24 DIAGNOSIS — E1122 Type 2 diabetes mellitus with diabetic chronic kidney disease: Secondary | ICD-10-CM | POA: Diagnosis not present

## 2020-05-24 DIAGNOSIS — E1140 Type 2 diabetes mellitus with diabetic neuropathy, unspecified: Secondary | ICD-10-CM

## 2020-05-24 DIAGNOSIS — D630 Anemia in neoplastic disease: Secondary | ICD-10-CM

## 2020-05-24 DIAGNOSIS — C569 Malignant neoplasm of unspecified ovary: Secondary | ICD-10-CM

## 2020-05-24 DIAGNOSIS — C561 Malignant neoplasm of right ovary: Secondary | ICD-10-CM | POA: Diagnosis not present

## 2020-05-24 DIAGNOSIS — Z95828 Presence of other vascular implants and grafts: Secondary | ICD-10-CM

## 2020-05-24 DIAGNOSIS — G893 Neoplasm related pain (acute) (chronic): Secondary | ICD-10-CM | POA: Diagnosis not present

## 2020-05-24 DIAGNOSIS — R180 Malignant ascites: Secondary | ICD-10-CM | POA: Diagnosis not present

## 2020-05-24 LAB — CBC WITH DIFFERENTIAL (CANCER CENTER ONLY)
Abs Immature Granulocytes: 0.11 10*3/uL — ABNORMAL HIGH (ref 0.00–0.07)
Basophils Absolute: 0 10*3/uL (ref 0.0–0.1)
Basophils Relative: 0 %
Eosinophils Absolute: 0 10*3/uL (ref 0.0–0.5)
Eosinophils Relative: 0 %
HCT: 29.6 % — ABNORMAL LOW (ref 36.0–46.0)
Hemoglobin: 9.7 g/dL — ABNORMAL LOW (ref 12.0–15.0)
Immature Granulocytes: 2 %
Lymphocytes Relative: 11 %
Lymphs Abs: 0.8 10*3/uL (ref 0.7–4.0)
MCH: 28.4 pg (ref 26.0–34.0)
MCHC: 32.8 g/dL (ref 30.0–36.0)
MCV: 86.5 fL (ref 80.0–100.0)
Monocytes Absolute: 0.2 10*3/uL (ref 0.1–1.0)
Monocytes Relative: 2 %
Neutro Abs: 6.2 10*3/uL (ref 1.7–7.7)
Neutrophils Relative %: 85 %
Platelet Count: 421 10*3/uL — ABNORMAL HIGH (ref 150–400)
RBC: 3.42 MIL/uL — ABNORMAL LOW (ref 3.87–5.11)
RDW: 15.5 % (ref 11.5–15.5)
WBC Count: 7.3 10*3/uL (ref 4.0–10.5)
nRBC: 0 % (ref 0.0–0.2)

## 2020-05-24 LAB — CMP (CANCER CENTER ONLY)
ALT: 19 U/L (ref 0–44)
AST: 42 U/L — ABNORMAL HIGH (ref 15–41)
Albumin: 2.5 g/dL — ABNORMAL LOW (ref 3.5–5.0)
Alkaline Phosphatase: 227 U/L — ABNORMAL HIGH (ref 38–126)
Anion gap: 12 (ref 5–15)
BUN: 30 mg/dL — ABNORMAL HIGH (ref 8–23)
CO2: 20 mmol/L — ABNORMAL LOW (ref 22–32)
Calcium: 9.2 mg/dL (ref 8.9–10.3)
Chloride: 105 mmol/L (ref 98–111)
Creatinine: 1.54 mg/dL — ABNORMAL HIGH (ref 0.44–1.00)
GFR, Est AFR Am: 36 mL/min — ABNORMAL LOW (ref >60)
GFR, Estimated: 31 mL/min — ABNORMAL LOW (ref >60)
Glucose, Bld: 173 mg/dL — ABNORMAL HIGH (ref 70–99)
Potassium: 4.2 mmol/L (ref 3.5–5.1)
Sodium: 137 mmol/L (ref 135–145)
Total Bilirubin: 0.2 mg/dL — ABNORMAL LOW (ref 0.3–1.2)
Total Protein: 6.1 g/dL — ABNORMAL LOW (ref 6.5–8.1)

## 2020-05-24 LAB — SAMPLE TO BLOOD BANK

## 2020-05-24 MED ORDER — DIPHENHYDRAMINE HCL 50 MG/ML IJ SOLN
INTRAMUSCULAR | Status: AC
  Filled 2020-05-24: qty 1

## 2020-05-24 MED ORDER — SODIUM CHLORIDE 0.9% FLUSH
10.0000 mL | INTRAVENOUS | Status: DC | PRN
  Administered 2020-05-24: 10 mL
  Filled 2020-05-24: qty 10

## 2020-05-24 MED ORDER — PALONOSETRON HCL INJECTION 0.25 MG/5ML
0.2500 mg | Freq: Once | INTRAVENOUS | Status: AC
  Administered 2020-05-24: 0.25 mg via INTRAVENOUS

## 2020-05-24 MED ORDER — HYDROCODONE-ACETAMINOPHEN 5-325 MG PO TABS: 1.0000 | ORAL_TABLET | Freq: Four times a day (QID) | ORAL | 0 refills | Status: DC | PRN

## 2020-05-24 MED ORDER — PALONOSETRON HCL INJECTION 0.25 MG/5ML
INTRAVENOUS | Status: AC
  Filled 2020-05-24: qty 5

## 2020-05-24 MED ORDER — DIPHENHYDRAMINE HCL 50 MG/ML IJ SOLN
50.0000 mg | Freq: Once | INTRAMUSCULAR | Status: AC
  Administered 2020-05-24: 50 mg via INTRAVENOUS

## 2020-05-24 MED ORDER — HEPARIN SOD (PORK) LOCK FLUSH 100 UNIT/ML IV SOLN
500.0000 [IU] | Freq: Once | INTRAVENOUS | Status: AC | PRN
  Administered 2020-05-24: 500 [IU]
  Filled 2020-05-24: qty 5

## 2020-05-24 MED ORDER — FAMOTIDINE IN NACL 20-0.9 MG/50ML-% IV SOLN
INTRAVENOUS | Status: AC
  Filled 2020-05-24: qty 50

## 2020-05-24 MED ORDER — FAMOTIDINE IN NACL 20-0.9 MG/50ML-% IV SOLN
20.0000 mg | Freq: Once | INTRAVENOUS | Status: AC
  Administered 2020-05-24: 20 mg via INTRAVENOUS

## 2020-05-24 MED ORDER — SODIUM CHLORIDE 0.9 % IV SOLN
131.2500 mg/m2 | Freq: Once | INTRAVENOUS | Status: AC
  Administered 2020-05-24: 228 mg via INTRAVENOUS
  Filled 2020-05-24: qty 38

## 2020-05-24 MED ORDER — SODIUM CHLORIDE 0.9 % IV SOLN
Freq: Once | INTRAVENOUS | Status: AC
  Filled 2020-05-24: qty 250

## 2020-05-24 MED ORDER — SODIUM CHLORIDE 0.9% FLUSH
10.0000 mL | Freq: Once | INTRAVENOUS | Status: AC
  Administered 2020-05-24: 10 mL
  Filled 2020-05-24: qty 10

## 2020-05-24 MED ORDER — SODIUM CHLORIDE 0.9 % IV SOLN
328.2000 mg | Freq: Once | INTRAVENOUS | Status: AC
  Administered 2020-05-24: 330 mg via INTRAVENOUS
  Filled 2020-05-24: qty 33

## 2020-05-24 MED ORDER — SODIUM CHLORIDE 0.9 % IV SOLN
10.0000 mg | Freq: Once | INTRAVENOUS | Status: AC
  Administered 2020-05-24: 10 mg via INTRAVENOUS
  Filled 2020-05-24: qty 10

## 2020-05-24 MED ORDER — SODIUM CHLORIDE 0.9 % IV SOLN
150.0000 mg | Freq: Once | INTRAVENOUS | Status: AC
  Administered 2020-05-24: 150 mg via INTRAVENOUS
  Filled 2020-05-24: qty 150

## 2020-05-24 NOTE — Progress Notes (Signed)
Per Dr. [name], ok to treat with Scr. 1.54.

## 2020-05-24 NOTE — Patient Instructions (Signed)
[facility] Discharge Instructions for Patients Receiving Chemotherapy  Today you received the following chemotherapy agents taxol; carboplatin  To help prevent nausea and vomiting after your treatment, we encourage you to take your nausea medication as directed   If you develop nausea and vomiting that is not controlled by your nausea medication, call the clinic.   BELOW ARE SYMPTOMS THAT SHOULD BE REPORTED IMMEDIATELY:  *FEVER GREATER THAN 100.5 F  *CHILLS WITH OR WITHOUT FEVER  NAUSEA AND VOMITING THAT IS NOT CONTROLLED WITH YOUR NAUSEA MEDICATION  *UNUSUAL SHORTNESS OF BREATH  *UNUSUAL BRUISING OR BLEEDING  TENDERNESS IN MOUTH AND THROAT WITH OR WITHOUT PRESENCE OF ULCERS  *URINARY PROBLEMS  *BOWEL PROBLEMS  UNUSUAL RASH Items with * indicate a potential emergency and should be followed up as soon as possible.  Feel free to call the clinic should you have any questions or concerns. The clinic phone number is [phone].  Please show the [facility] at check-in to the Emergency Department and triage nurse.

## 2020-05-24 NOTE — Assessment & Plan Note (Signed)
Her renal function is stable I recommend increase oral intake as tolerated I will adjust the dose of carboplatin accordingly

## 2020-05-24 NOTE — Assessment & Plan Note (Signed)
She tolerated recent chemo better. However, she felt somewhat bloated with distention of her abdomen On examination, in addition to persistent carcinomatosis, I felt that her abdominal distention is consistent with malignant ascites I will order repeat paracentesis for symptomatic relief I plan to see her again next week for further follow-up

## 2020-05-24 NOTE — Progress Notes (Signed)
[place] OFFICE PROGRESS NOTE  Patient Care Team: [name], MD as PCP - Kinnie Scales, OD as Referring Physician (Optometry) [name], MD as Consulting Physician (Dermatology) Latanya Maudlin, MD as Consulting Physician (Orthopedic Surgery) Inda Castle, MD (Inactive) as Consulting Physician (Gastroenterology) Awanda Mink Craige Cotta, RN as Oncology Nurse Navigator (Oncology) Heath Lark, MD as Consulting Physician (Hematology and Oncology)  ASSESSMENT & PLAN:  Ovarian cancer [facility]) She tolerated recent chemo better. However, she felt somewhat bloated with distention of her abdomen On examination, in addition to persistent carcinomatosis, I felt that her abdominal distention is consistent with malignant ascites I will order repeat paracentesis for symptomatic relief I plan to see her again next week for further follow-up  Cancer associated pain She developed skin itching from hydromorphone I will switch her to hydrocodone I will assess pain control next week  Malignant ascites She has recurrent malignant ascites on exam She is symptomatic I recommend repeat paracentesis  Diabetic neuropathy, painful ([place]) We discussed the risk and benefits of physical therapy and rehab and she agreed  CKD stage 3 due to type 2 diabetes mellitus ([place]) Her renal function is stable I recommend increase oral intake as tolerated I will adjust the dose of carboplatin accordingly   Orders Placed This Encounter  Procedures  . IR Paracentesis    Standing Status:   Future    Standing Expiration Date:   05/24/2021    Order Specific Question:   If therapeutic, is there a maximum amount of fluid to be removed?    Answer:   Yes    Order Specific Question:   What is the maximum amount of fluide to be removed?    Answer:   5 liter    Order Specific Question:   Are labs required for specimen collection?    Answer:   No    Order Specific Question:   Is Albumin medication  needed?    Answer:   No    Order Specific Question:   Reason for Exam (SYMPTOM  OR DIAGNOSIS REQUIRED)    Answer:   ascites    Order Specific Question:   Preferred Imaging Location?    Answer:   [facility]  . Ambulatory referral to Physical Therapy    Referral Priority:   Routine    Referral Type:   Physical Medicine    Referral Reason:   Specialty Services Required    Requested Specialty:   Physical Therapy    Number of Visits Requested:   1    All questions were answered. The patient knows to call the clinic with any problems, questions or concerns. The total time spent in the appointment was 30 minutes encounter with patients including review of chart and various tests results, discussions about plan of care and coordination of care plan   Heath Lark, MD 05/24/2020 5:51 PM  INTERVAL HISTORY: Please see below for problem oriented charting. She returns with her husband for further follow-up She complained of skin itching after taking pain medicine She has intermittent abdominal pain but it does not prohibit eating She felt distended with ascites again She has persistent peripheral neuropathy, not worse No recent nausea or vomiting She has regular bowel habits  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[place])  02/26/2016 Initial Diagnosis   Left breast biopsy 11:00 position: invasive ductal carcinoma with DCIS, ER 90%, PR 10%, HER-2 negative, Ki-67 30%, grade 2,  2.2 cm palpable lesion T2 N0 stage II a clinical stage   03/18/2016 Surgery   Left lumpectomy: Invasive ductal carcinoma, grade 2, 6.3 cm, with high-grade DCIS, margins negative, 0/4 lymph nodes negative, ER 90%, via 10%, HER-2 negative ratio 0.97, Ki-67 30%, T3 N0 stage IIB   03/25/2016 Procedure   Genetic testing is negative for pathogenic mutations within any of the 20 Genes on the breast/ovarian cancer panel   04/04/2016 Oncotype testing    Oncotype DX recurrence score 37, 25% 10 year distant risk of recurrence   04/17/2016 - 07/31/2016 Chemotherapy   Adjuvant chemotherapy with dose dense Adriamycin and Cytoxan followed by Abraxane weekly 8 ( discontinued due to neuropathy)   09/01/2016 - 09/26/2016 Radiation Therapy   Adj XRT 1) Left breast: 42.5 Gy in 17 fractions. 2) Left breast boost: 7.5 Gy in 3 fractions.   12/02/2016 -  Anti-estrogen oral therapy   Anastrozole 1 mg daily   Ovarian cancer ([place])  04/03/2020 Imaging   US pelvis Complex cystic and solid mass in LEFT adnexa 12.5 cm diameter question cystic ovarian neoplasm; recommend correlation with serum tumor markers and further evaluation by MR imaging with and without contrast.     04/04/2020 Imaging   US venous Doppler No evidence of deep venous thrombosis in the right lower extremity. Left common femoral vein also patent.   04/15/2020 Imaging   MRI pelvis 1. Large complex solid and cystic mass arising from the right adnexa measuring 10.8 by 11.5 by 11.1 cm. This has an aggressive appearance with extensive enhancing mural soft tissue components. Findings are highly suspicious for malignant ovarian neoplasm. 2. Extensive bilateral retroperitoneal and bilateral iliac adenopathy compatible with metastatic disease. 3. Signs of extensive peritoneal carcinomatosis including ascites, enhancement, thickening and nodularity of the peritoneal reflections, omental caking and bulky peritoneal nodularity. 4. Suspected serosal involvement of the dome of bladder with loss of normal fat plane. Cannot rule out mural invasion by tumor.   04/17/2020 Tumor Marker   Patient's tumor was tested for the following markers: CA-125 Results of the tumor marker test revealed 1762.   04/20/2020 Cancer Staging   Staging form: Ovary, Fallopian Tube, and Primary Peritoneal Carcinoma, AJCC 8th Edition - Clinical: FIGO Stage IIIC (cT3, cN1, cM0) - Signed by Heath Lark, MD on 04/20/2020   04/23/2020  Imaging   1. Redemonstrated dominant mixed solid and cystic mass arising from the vicinity of the right ovary measuring at least 11.5 x 10.7 cm, not significantly changed compared to prior MR, and consistent with primary ovarian malignancy.  2. Numerous bulky retroperitoneal, bilateral iliac, and pelvic sidewall lymph nodes. 3. Moderate volume ascites throughout the abdomen and pelvis with subtle thickening and nodularity throughout the peritoneum, and extensive bulky nodular metastatic disease of the omentum. 4. Constellation of findings is consistent with advanced nodal and peritoneal metastatic disease. 5. There are prominent subcentimeter epicardial lymph nodes, nonspecific although suspicious for nodal metastatic disease. No definite nodal metastatic disease in the chest. Attention on follow-up. 6. There are multiple small subpleural nodules at the right lung base overlying the diaphragm, measuring up to 7 mm. These are generally nonspecific and less favored to represent pulmonary metastatic disease given distribution. Attention on follow-up. 7. Somewhat coarse contour of the liver, suggestive of cirrhosis, although out without overt morphologic stigmata. 8. Aortic Atherosclerosis (ICD10-I70.0).     04/24/2020 Procedure   Successful placement of a right internal jugular approach power injectable Port-A-Cath. The catheter is ready for immediate use.  05/02/2020 Tumor Marker   Patient's tumor was tested for the following markers: CA-125 Results of the tumor marker test revealed [id number]   05/03/2020 -  Chemotherapy   The patient had carboplatin and taxol for chemotherapy treatment.     05/08/2020 Procedure   Successful ultrasound-guided paracentesis yielding 2.6 liters of peritoneal fluid.   05/16/2020 Procedure   Successful ultrasound-guided paracentesis yielding 3.2 liters of peritoneal fluid     REVIEW OF SYSTEMS:   Constitutional: Denies fevers, chills or abnormal weight loss Eyes:  Denies blurriness of vision Ears, nose, mouth, throat, and face: Denies mucositis or sore throat Respiratory: Denies cough, dyspnea or wheezes Cardiovascular: Denies palpitation, chest discomfort  Gastrointestinal:  Denies nausea, heartburn or change in bowel habits Skin: Denies abnormal skin rashes Lymphatics: Denies new lymphadenopathy or easy bruising Behavioral/Psych: Mood is stable, no new changes  All other systems were reviewed with the patient and are negative.  I have reviewed the past medical history, past surgical history, social history and family history with the patient and they are unchanged from previous note.  ALLERGIES:  is allergic to keflex [cephalexin], meloxicam, prednisone, premarin [estrogens conjugated], and trovan [alatrofloxacin].  MEDICATIONS:  Current Outpatient Medications  Medication Sig Dispense Refill  . acetaminophen (TYLENOL) 325 MG tablet Take 325 mg by mouth every 6 (six) hours as needed for headache.    . allopurinol (ZYLOPRIM) 300 MG tablet Take 1/2 tablet Daily to Prevent Gout 90 tablet 3  . blood glucose meter kit and supplies KIT Dispense based on patient and insurance preference. Use to check blood sugar once daily. Dx: E11.22, N18.30 1 each 12  . Cholecalciferol (VITAMIN D3) 5000 units CAPS Take 5,000 Units by mouth daily.    Marland Kitchen dexamethasone (DECADRON) 4 MG tablet Take 2 tabs at the night before and 2 tab the morning of chemotherapy, every 3 weeks, by mouth 36 tablet 5  . gabapentin (NEURONTIN) 300 MG capsule Take 1 capsule 3 to 4  x /day as needed for pain (Patient taking differently: Take 300 mg by mouth See admin instructions. Take 300 mg 3 times daily may take a 4th 300 mg dose as needed for pain) 360 capsule 3  . glimepiride (AMARYL) 1 MG tablet Take 1 tab by mouth in the morning if fasting is running 150+. Please do not take this medication if you are ill or not eating as this can cause a low blood sugar. 90 tablet 1  . glucose blood  (FREESTYLE TEST STRIPS) test strip Check Blood Sugar Daily as directed (Dx: E11.29) 100 strip 3  . HYDROcodone-acetaminophen (NORCO/VICODIN) 5-325 MG tablet Take 1 tablet by mouth every 6 (six) hours as needed for moderate pain. 30 tablet 0  . Lancets (FREESTYLE) lancets CHECK BLOOD SUGAR DAILY AS DIRECTED 100 each 3  . lidocaine-prilocaine (EMLA) cream Apply to affected area once (Patient taking differently: Apply 1 application topically daily as needed (port access). ) 30 g 3  . loratadine (CLARITIN) 10 MG tablet Take 10 mg by mouth daily as needed for allergies.    . Magnesium ([employer] MAGNESIUM) 250 MG TABS Take 250 mg by mouth 2 (two) times daily.    . metoprolol succinate (TOPROL-XL) 25 MG 24 hr tablet Take 1 tablet Daily for BP (Patient taking differently: Take 25 mg by mouth daily. ) 90 tablet 3  . mirabegron ER (MYRBETRIQ) 50 MG TB24 tablet Take 1 tablet (50 mg total) by mouth daily. 30 tablet 2  . ondansetron (ZOFRAN) 8 MG tablet Take  1 tablet (8 mg total) by mouth 2 (two) times daily as needed for refractory nausea / vomiting. Start on day 3 after carboplatin chemo. (Patient not taking: Reported on 05/09/2020) 30 tablet 1  . prochlorperazine (COMPAZINE) 10 MG tablet Take 1 tablet (10 mg total) by mouth every 6 (six) hours as needed for nausea or vomiting. (Patient not taking: Reported on 05/09/2020) 60 tablet 9  . vitamin C (ASCORBIC ACID) 500 MG tablet Take 500 mg by mouth every evening.      No current facility-administered medications for this visit.   Facility-Administered Medications Ordered in Other Visits  Medication Dose Route Frequency Provider Last Rate Last Admin  . sodium chloride flush (NS) 0.9 % injection 10 mL  10 mL Intracatheter PRN [name], Jasmyn Picha, MD   10 mL at 05/24/20 1442    PHYSICAL EXAMINATION: ECOG PERFORMANCE STATUS: 2 - Symptomatic, <50% confined to bed  Vitals:   05/24/20 0826  BP: 132/62  Pulse: 90  Resp: 16  Temp: 97.8 F (36.6 C)  SpO2: 100%   Filed  Weights   05/24/20 0826  Weight: 149 lb 6.4 oz (67.8 kg)    GENERAL:alert, no distress and comfortable SKIN: skin color, texture, turgor are normal, no rashes or significant lesions EYES: normal, Conjunctiva are pink and non-injected, sclera clear OROPHARYNX:no exudate, no erythema and lips, buccal mucosa, and tongue normal  NECK: supple, thyroid normal size, non-tender, without nodularity LYMPH:  no palpable lymphadenopathy in the cervical, axillary or inguinal LUNGS: clear to auscultation and percussion with normal breathing effort HEART: regular rate & rhythm and no murmurs with persistent right lower extremity edema ABDOMEN:abdomen soft, non-tender and normal bowel sounds.  Distended with ascites Musculoskeletal:no cyanosis of digits and no clubbing  NEURO: alert & oriented x 3 with fluent speech, no focal motor/sensory deficits  LABORATORY DATA:  I have reviewed the data as listed    Component Value Date/Time   NA 137 05/24/2020 0807   NA 141 11/06/2016 1102   K 4.2 05/24/2020 0807   K 3.9 11/06/2016 1102   CL 105 05/24/2020 0807   CO2 20 (L) 05/24/2020 0807   CO2 27 11/06/2016 1102   GLUCOSE 173 (H) 05/24/2020 0807   GLUCOSE 130 11/06/2016 1102   BUN 30 (H) 05/24/2020 0807   BUN 18.0 11/06/2016 1102   CREATININE 1.54 (H) 05/24/2020 0807   CREATININE 1.48 (H) 05/09/2020 1115   CREATININE 1.3 (H) 11/06/2016 1102   CALCIUM 9.2 05/24/2020 0807   CALCIUM 10.2 11/06/2016 1102   PROT 6.1 (L) 05/24/2020 0807   PROT 7.1 11/06/2016 1102   ALBUMIN 2.5 (L) 05/24/2020 0807   ALBUMIN 3.7 11/06/2016 1102   AST 42 (H) 05/24/2020 0807   AST 52 (H) 11/06/2016 1102   ALT 19 05/24/2020 0807   ALT 58 (H) 11/06/2016 1102   ALKPHOS 227 (H) 05/24/2020 0807   ALKPHOS 159 (H) 11/06/2016 1102   BILITOT 0.2 (L) 05/24/2020 0807   BILITOT 0.53 11/06/2016 1102   GFRNONAA 31 (L) 05/24/2020 0807   GFRNONAA 33 (L) 05/09/2020 1115   GFRAA 36 (L) 05/24/2020 0807   GFRAA 38 (L) 05/09/2020 1115     No results found for: SPEP, UPEP  Lab Results  Component Value Date   WBC 7.3 05/24/2020   NEUTROABS 6.2 05/24/2020   HGB 9.7 (L) 05/24/2020   HCT 29.6 (L) 05/24/2020   MCV 86.5 05/24/2020   PLT 421 (H) 05/24/2020      Chemistry  Component Value Date/Time   NA 137 05/24/2020 0807   NA 141 11/06/2016 1102   K 4.2 05/24/2020 0807   K 3.9 11/06/2016 1102   CL 105 05/24/2020 0807   CO2 20 (L) 05/24/2020 0807   CO2 27 11/06/2016 1102   BUN 30 (H) 05/24/2020 0807   BUN 18.0 11/06/2016 1102   CREATININE 1.54 (H) 05/24/2020 0807   CREATININE 1.48 (H) 05/09/2020 1115   CREATININE 1.3 (H) 11/06/2016 1102      Component Value Date/Time   CALCIUM 9.2 05/24/2020 0807   CALCIUM 10.2 11/06/2016 1102   ALKPHOS 227 (H) 05/24/2020 0807   ALKPHOS 159 (H) 11/06/2016 1102   AST 42 (H) 05/24/2020 0807   AST 52 (H) 11/06/2016 1102   ALT 19 05/24/2020 0807   ALT 58 (H) 11/06/2016 1102   BILITOT 0.2 (L) 05/24/2020 0807   BILITOT 0.53 11/06/2016 1102

## 2020-05-24 NOTE — Assessment & Plan Note (Signed)
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

## 2020-05-24 NOTE — Assessment & Plan Note (Signed)
We discussed the risk and benefits of physical therapy and rehab and she agreed

## 2020-05-24 NOTE — Assessment & Plan Note (Signed)
She developed skin itching from hydromorphone I will switch her to hydrocodone I will assess pain control next week

## 2020-05-25 ENCOUNTER — Ambulatory Visit (HOSPITAL_COMMUNITY)
Admission: RE | Admit: 2020-05-25 | Discharge: 2020-05-25 | Disposition: A | Discharge status: Home / Self Care | Payer: PPO | Source: Ambulatory Visit | Attending: Hematology and Oncology | Admitting: Hematology and Oncology

## 2020-05-25 ENCOUNTER — Telehealth: Payer: Self-pay | Admitting: Hematology and Oncology

## 2020-05-25 DIAGNOSIS — C569 Malignant neoplasm of unspecified ovary: Secondary | ICD-10-CM | POA: Diagnosis not present

## 2020-05-25 DIAGNOSIS — C801 Malignant (primary) neoplasm, unspecified: Secondary | ICD-10-CM | POA: Diagnosis not present

## 2020-05-25 DIAGNOSIS — R180 Malignant ascites: Secondary | ICD-10-CM | POA: Diagnosis not present

## 2020-05-25 HISTORY — PX: IR PARACENTESIS: IMG2679

## 2020-05-25 MED ORDER — LIDOCAINE HCL 1 % IJ SOLN
INTRAMUSCULAR | Status: DC | PRN
  Administered 2020-05-25: 10 mL

## 2020-05-25 MED ORDER — LIDOCAINE HCL 1 % IJ SOLN
INTRAMUSCULAR | Status: AC
  Filled 2020-05-25: qty 20

## 2020-05-25 NOTE — Telephone Encounter (Signed)
Scheduled per 6/24 sch message. Unable to reach pt. Left voicemail with appt times and dates.

## 2020-05-25 NOTE — Procedures (Signed)
  PROCEDURE SUMMARY:  Successful US guided paracentesis from RLQ.  Yielded 3.0 L of serosanguinous fluid.  No immediate complications.  Pt tolerated well.   Specimen was not sent for labs.  EBL < 66m  KAscencion DikePA-C 05/25/2020 1:48 PM

## 2020-05-29 ENCOUNTER — Other Ambulatory Visit: Payer: Self-pay

## 2020-05-29 ENCOUNTER — Ambulatory Visit: Payer: PPO | Attending: Hematology and Oncology

## 2020-05-29 DIAGNOSIS — C561 Malignant neoplasm of right ovary: Secondary | ICD-10-CM

## 2020-05-29 DIAGNOSIS — M79672 Pain in left foot: Secondary | ICD-10-CM | POA: Insufficient documentation

## 2020-05-29 DIAGNOSIS — M79671 Pain in right foot: Secondary | ICD-10-CM | POA: Diagnosis not present

## 2020-05-29 DIAGNOSIS — I890 Lymphedema, not elsewhere classified: Secondary | ICD-10-CM | POA: Diagnosis not present

## 2020-05-29 DIAGNOSIS — R2689 Other abnormalities of gait and mobility: Secondary | ICD-10-CM

## 2020-05-29 DIAGNOSIS — Z9181 History of falling: Secondary | ICD-10-CM | POA: Diagnosis not present

## 2020-05-29 NOTE — Patient Instructions (Signed)
Access Code: [email or web address] URL: [email or web address] Date: 05/29/2020 Prepared by: Tomma Rakers  Exercises Standing Upper Cervical Flexion and Extension - 1 x daily - 7 x weekly - 20 reps - 1 sets Standing Cervical Sidebending AROM - 1 x daily - 7 x weekly - 20 reps - 1 sets Standing Cervical Rotation AROM - 1 x daily - 7 x weekly - 20 reps - 1 sets Standing Shoulder Flexion Full Range - 1 x daily - 7 x weekly - 20 reps - 1 sets Standing Shoulder Shrugs - 1 x daily - 7 x weekly - 20 reps - 1 sets Standing Scapular Retraction - 1 x daily - 7 x weekly - 20 reps - 1 sets Trunk Sidebending with Compression Garment - 1 x daily - 7 x weekly - 20 reps - 1 sets Standing Hip Extension with Counter Support - 1 x daily - 7 x weekly - 20 reps - 1 sets Seated Knee Flexion Extension AROM - 1 x daily - 7 x weekly - 20 reps - 1 sets Heel Toe Raises with Counter Support - 1 x daily - 7 x weekly - 20 reps - 1 sets

## 2020-05-29 NOTE — Therapy (Signed)
[place], [place], [address] Phone: [phone]   Fax:  [phone]  Physical Therapy Evaluation  Patient Details  Name: Kayla Baker MRN: [id number] Date of Birth: [birth year] Referring Provider (PT): Heath Lark MD   Encounter Date: 05/29/2020   PT End of Session - 05/29/20 1457    Visit Number 1    Number of Visits 13    Date for PT Re-Evaluation 07/03/20    PT Start Time 5732    PT Stop Time 1355    PT Time Calculation (min) 51 min    Activity Tolerance Patient tolerated treatment well    Behavior During Therapy [facility] for tasks assessed/performed           Past Medical History:  Diagnosis Date  . Allergy   . Anemia   . Arthritis   . Blood transfusion without reported diagnosis   . Breast cancer of upper-inner quadrant of left female breast ([place]) 02/29/2016   skin- 2016- squamous- on right upper arm  . GERD (gastroesophageal reflux disease)   . Gout    takes Allopurinol daily  . History of chemotherapy   . History of colon polyps    benign  . History of shingles   . Hx of radiation therapy   . Hyperlipidemia   . Hypertension   . Ovarian ca ([place]) dx'd 03/2020  . Personal history of chemotherapy    2017  . Personal history of radiation therapy    2017  . Vitamin D deficiency     Past Surgical History:  Procedure Laterality Date  . ABDOMINAL HYSTERECTOMY  1973  . BREAST BIOPSY Left 02/26/2016  . BREAST LUMPECTOMY Left 03/18/2016  . CARDIAC CATHETERIZATION     patient denies this procedure  . CATARACT EXTRACTION, BILATERAL  2014   right eye 2/14; left eye 3/3  . COLONOSCOPY    . HAND SURGERY Left 2012  . INCONTINENCE SURGERY  2006  . IR IMAGING GUIDED PORT INSERTION  04/24/2020  . IR PARACENTESIS  05/08/2020  . IR PARACENTESIS  05/16/2020  . IR PARACENTESIS  05/25/2020  . KNEE ARTHROSCOPY Right   . MASTOPEXY Right 06/16/2017   Procedure: RIGHT BREAST MASTOPEXY;  Surgeon: Irene Limbo, MD;  Location: [facility];  Service: Plastics;  Laterality: Right;  . PORT-A-CATH REMOVAL N/A 11/27/2016   Procedure: REMOVAL PORT-A-CATH;  Surgeon: Fanny Skates, MD;  Location[facility];  Service: General;  Laterality: N/A;  . PORTACATH PLACEMENT N/A 04/14/2016   Procedure: INSERTION PORT-A-CATH ;  Surgeon: Fanny Skates, MD;  Location: [facility];  Service: General;  Laterality: N/A;  . RADIOACTIVE SEED GUIDED PARTIAL MASTECTOMY WITH AXILLARY SENTINEL LYMPH NODE BIOPSY Left 03/18/2016   Procedure: RADIOACTIVE SEED GUIDED LEFT PARTIAL MASTECTOMY WITH AXILLARY SENTINEL LYMPH NODE BIOPSY AND BLUE DYE INJECTION;  Surgeon: Fanny Skates, MD;  Location: [facility];  Service: General;  Laterality: Left;  . REDUCTION MAMMAPLASTY Right 2018    There were no vitals filed for this visit.    Subjective Assessment - 05/29/20 1307    Subjective Pt reports that she has some swelling in her legs that started around her ankle in march and then started to move up towards her knee. Initially her MD thought it was from her knee becuase she had had 2 surgeries on her knee. When she went back to the MD in May the swelling had gotten all the way up to her groin. She was then sent for  further imaging and has had 2 chemotherapies and will most likely receive one more before surgery. She reports that she has neuropathy from diabetes in the plantar aspect of both feet and her spouse has noticed that she is walking differently than she was previously.    Pertinent History Redemonstrated dominant mixed solid and cystic mass arising from the vicinity of the right ovary measuring at least 11.5 x 10.7 cm malignant orvarian mass, CKD stage III, possible chronic cirrhosis, history of L breast cancer with lumpectomy, chemotherapy and radiation.    Patient Stated Goals I want my legs to be normal.    Currently in Pain? Yes    Pain Score 7     Pain Location Foot    Pain Orientation Right;Left    Pain Descriptors /  Indicators Numbness    Pain Type Chronic pain    Pain Onset More than a month ago    Pain Frequency Constant    Aggravating Factors  unknown    Pain Relieving Factors unknown    Effect of Pain on Daily Activities Pt spouse states she has been walking differently.              [facility] PT Assessment - 05/29/20 0001      Assessment   Medical Diagnosis R ovarian cancer     Referring Provider (PT) Heath Lark MD    Hand Dominance Right      Precautions   Precautions Other (comment)    Precaution Comments CKD stage III, risk for LUE lymphedema, bil LE lymphedema, possible cirrhosis with ascites.       Restrictions   Weight Bearing Restrictions No      Balance Screen   Has the patient fallen in the past 6 months No    Has the patient had a decrease in activity level because of a fear of falling?  Yes    Is the patient reluctant to leave their home because of a fear of falling?  Yes      [place] residence    Living Arrangements Spouse/significant other    Type of [place] to enter    Entrance Stairs-Number of Steps 2    Entrance Stairs-Rails Cannot reach both    [place] One level      Prior Function   Level of [place] Retired    Leisure pt does not exercise.       Cognition   Overall Cognitive Status Within Functional Limits for tasks assessed      Observation/Other Assessments   Observations swelling in the L ankle and throughout the RLE.       Posture/Postural Control   Posture/Postural Control Postural limitations    Postural Limitations Rounded Shoulders;Forward head      Standardized Balance Assessment   Standardized Balance Assessment Timed Up and Go Test;Five Times Sit to Stand    Five times sit to stand comments  30 second sit to stand 7x w/o UE support from standard chair height.       Timed Up and Go Test   TUG Normal TUG    Normal TUG (seconds) 14.39               LYMPHEDEMA/ONCOLOGY QUESTIONNAIRE - 05/29/20 0001      Type   Cancer Type Ovarian       Treatment   Active Chemotherapy Treatment Yes    Date  05/24/20    Past Chemotherapy Treatment Yes    Active Radiation Treatment No    Past Radiation Treatment Yes    Body Site L breast     Current Hormone Treatment No    Past Hormone Therapy Yes    Drug Name Anastrozole      What other symptoms do you have   Are you Having Heaviness or Tightness Yes    Are you having Pain Yes    Are you having pitting edema Yes    Body Site ankles bil     Is it Hard or Difficult finding clothes that fit Yes    Do you have infections No    Is there Decreased scar mobility No      Lymphedema Assessments   Lymphedema Assessments Lower extremities      Right Lower Extremity Lymphedema   20 cm Proximal to Suprapatella 49.5 cm    10 cm Proximal to Suprapatella 43.8 cm    At Midpatella/Popliteal Crease 40.4 cm    30 cm Proximal to Floor at Lateral Plantar Foot 37.2 cm    20 cm Proximal to Floor at Lateral Plantar Foot 30.4 1    10  cm Proximal to Floor at Lateral Malleoli 24.4 cm    5 cm Proximal to 1st MTP Joint 21.4 cm    Across MTP Joint 22.2 cm    Around Proximal Great Toe 7.9 cm      Left Lower Extremity Lymphedema   20 cm Proximal to Suprapatella 45.2 cm    10 cm Proximal to Suprapatella 39.5 cm    At Midpatella/Popliteal Crease 36 cm    30 cm Proximal to Floor at Lateral Plantar Foot 33.4 cm    20 cm Proximal to Floor at Lateral Plantar Foot 25.2 cm    10 cm Proximal to Floor at Lateral Malleoli 22.8 cm    5 cm Proximal to 1st MTP Joint 21.7 cm    Across MTP Joint 22.3 cm    Around Proximal Great Toe 7.8 cm                   [facility]patient [facility]29/[facility]  Lymphedema Life Impact Scale Total Score 52.94 %      Objective measurements completed on examination: See above findings.               PT Education -  05/29/20 1455    Education Details Access Code: [email or web address], Pt and spouse were educated on the anatomy and physiology of the lymphatic system, discussed lymphatic flow and precautions with lymphatic treatment due to co-morbidities. Pt and spouse were educated on risk reduction precuations and provided with information on how to wrap the lower extremities. Pt and spouse will look this over with the expectation that they will learn MLD and wrapping at home.    Person(s) Educated Patient;Spouse    Methods Explanation;Demonstration;Handout    Comprehension Verbalized understanding            PT Short Term Goals - 05/29/20 1504      PT SHORT TERM GOAL #1   Title Pt will be independent with self MLD and LE wrapping in order to demonstrate autonomy of care.    Baseline pt currently does not know how to wrap or perform MLD    Time 4    Period Weeks    Status New    Target Date 07/03/20  PT Long Term Goals - 05/29/20 1731      PT LONG TERM GOAL #1   Title Pt will demonstrate 1 cm reduction in the L ankle and 3 cm reduction in the R ankle, calf and thigh within 6 weeks to demonstrate decreased fluid.    Baseline see measurements    Time 4    Period Weeks    Status New    Target Date 07/03/20      PT LONG TERM GOAL #2   Title Pt will be measured for and fitted with appropriate compression garments to wear on a daily basis for home management of lymphedema.    Baseline currently pt has no compression    Time 4    Period Weeks    Status New    Target Date 07/03/20      PT LONG TERM GOAL #3   Title Pt will perform 9 sit to stands in 30 seconds and perform TUG at 13.5 seconds or less in order to demonstrate decreased risk for falls.    Baseline 7 sit to stands in 30 seconds and 14.39 second TUG score.    Time 4    Period Weeks    Status New    Target Date 07/03/20      PT LONG TERM GOAL #4   Title Pt will report 50% improvement in pain/function within 4 weeks in order to  demonstrate a subjective improvement in quality of life.    Baseline 7/10 pain    Time 4    Period Weeks    Status New    Target Date 07/03/20                  Plan - 05/29/20 1458    Clinical Impression Statement Pt presents to physical therapy with reports of edema in her bil LE with R>L. CIrcumferential measurements demonstrate an increase in fluid from the R ankle to groin and L ankle to knee. Pt demonstrates increased risk for falls with 30 second sit to stand and TUG scores most likely related to generalized weakness from cancer treatments and diabetes related neuropathy at bil feet. Pt and spouse were educated on precautions for lymphatic treatment due to pt co-morbidities and discussed treatment options for neuropathy in order to improve balance and decrease risk for falls. Pt will benefit from skilled physical therapy services in order to address the above limitations to decrease risk for falls and hospitalization.    Personal Factors and Comorbidities Comorbidity 3+    Comorbidities Ovarion tumor, possible cirrhosis, CKD stage III, History of L breast cancer with radiation.    Stability/Clinical Decision Making Evolving/Moderate complexity    Clinical Decision Making Moderate    Rehab Potential Good    PT Frequency 3x / week   This is fluid depending on pt and spouse ability to perform wrapping and MLD at home.   PT Duration 4 weeks    PT Treatment/Interventions Therapeutic activities;Therapeutic exercise;Balance training;Neuromuscular re-education;Manual techniques    PT Next Visit Plan Teach wrapping just to knee initially working toward groin slowly and self MLD for the LE avoid the abdomen just perform MLD at the LE, avoid compression on abdomen due to ascites. Balance, endurance and LE strengthening as tolerated/needed.    PT Home Exercise Plan Access Code: [email or web address]    Recommended Other Services Compression garments for LE day vs. just night garments heavier vs. lighter  compression due to pt diagnosis.    Consulted and Agree with Plan  of Care Patient;Family member/caregiver           Patient will benefit from skilled therapeutic intervention in order to improve the following deficits and impairments:  Decreased strength, Pain, Increased edema, Decreased balance  Visit Diagnosis: Malignant neoplasm of right ovary ([facility])  Balance disorder  Risk for falls  Pain in both feet  Lymphedema, not elsewhere classified     Problem List Patient Active Problem List   Diagnosis Date Noted  . Pancytopenia, acquired ([place]) 05/15/2020  . Cancer associated pain 05/15/2020  . Malignant ascites 05/07/2020  . Anemia in neoplastic disease 05/03/2020  . Goals of care, counseling/discussion 04/20/2020  . Ovarian cancer ([place]) 04/19/2020  . Carcinomatosis ([place]) 04/17/2020  . Leg edema, right 04/05/2020  . Incontinence of urine in female 04/04/2020  . CKD stage 3 due to type 2 diabetes mellitus ([place]) 07/05/2018  . Port catheter in place 09/18/2016  . Chemotherapy-induced peripheral neuropathy ([place]) 08/07/2016  . Breast cancer of upper-inner quadrant of left female breast ([place]) 02/29/2016  . Gout 12/08/2014  . Diabetic neuropathy, painful ([place]) 08/28/2014  . Medication management 08/27/2014  . Essential hypertension 11/10/2013  . Hyperlipidemia associated with type 2 diabetes mellitus ([place]) 11/10/2013  . T2_NIDDM w/CKD3 ([place]) 11/10/2013  . Vitamin D deficiency 11/10/2013    Ander Purpura , PT 05/29/2020, 5:35 PM  [place], [place], [address] Phone: [phone]   Fax:  [phone]  Name: Kayla Baker MRN: [id number] Date of Birth: 1936-05-03

## 2020-05-31 ENCOUNTER — Ambulatory Visit: Payer: PPO | Attending: Hematology and Oncology

## 2020-05-31 ENCOUNTER — Inpatient Hospital Stay: Payer: PPO | Attending: Gynecologic Oncology | Admitting: Hematology and Oncology

## 2020-05-31 ENCOUNTER — Encounter: Payer: Self-pay | Admitting: Hematology and Oncology

## 2020-05-31 ENCOUNTER — Other Ambulatory Visit: Payer: Self-pay

## 2020-05-31 ENCOUNTER — Telehealth: Payer: Self-pay

## 2020-05-31 VITALS — BP 120/55 | HR 103 | Temp 98.3°F | Resp 20 | Wt 144.2 lb

## 2020-05-31 DIAGNOSIS — Z5111 Encounter for antineoplastic chemotherapy: Secondary | ICD-10-CM | POA: Diagnosis not present

## 2020-05-31 DIAGNOSIS — C561 Malignant neoplasm of right ovary: Secondary | ICD-10-CM | POA: Diagnosis not present

## 2020-05-31 DIAGNOSIS — L03116 Cellulitis of left lower limb: Secondary | ICD-10-CM | POA: Insufficient documentation

## 2020-05-31 DIAGNOSIS — Z90721 Acquired absence of ovaries, unilateral: Secondary | ICD-10-CM | POA: Insufficient documentation

## 2020-05-31 DIAGNOSIS — M79672 Pain in left foot: Secondary | ICD-10-CM | POA: Diagnosis not present

## 2020-05-31 DIAGNOSIS — M79671 Pain in right foot: Secondary | ICD-10-CM | POA: Diagnosis not present

## 2020-05-31 DIAGNOSIS — I890 Lymphedema, not elsewhere classified: Secondary | ICD-10-CM

## 2020-05-31 DIAGNOSIS — Z170 Estrogen receptor positive status [ER+]: Secondary | ICD-10-CM | POA: Diagnosis not present

## 2020-05-31 DIAGNOSIS — C50212 Malignant neoplasm of upper-inner quadrant of left female breast: Secondary | ICD-10-CM | POA: Diagnosis not present

## 2020-05-31 DIAGNOSIS — C786 Secondary malignant neoplasm of retroperitoneum and peritoneum: Secondary | ICD-10-CM | POA: Diagnosis not present

## 2020-05-31 DIAGNOSIS — R918 Other nonspecific abnormal finding of lung field: Secondary | ICD-10-CM | POA: Diagnosis not present

## 2020-05-31 DIAGNOSIS — R2689 Other abnormalities of gait and mobility: Secondary | ICD-10-CM

## 2020-05-31 DIAGNOSIS — Z9181 History of falling: Secondary | ICD-10-CM

## 2020-05-31 DIAGNOSIS — R180 Malignant ascites: Secondary | ICD-10-CM

## 2020-05-31 DIAGNOSIS — Z79899 Other long term (current) drug therapy: Secondary | ICD-10-CM | POA: Diagnosis not present

## 2020-05-31 DIAGNOSIS — R600 Localized edema: Secondary | ICD-10-CM

## 2020-05-31 DIAGNOSIS — Z79811 Long term (current) use of aromatase inhibitors: Secondary | ICD-10-CM | POA: Diagnosis not present

## 2020-05-31 MED ORDER — DOXYCYCLINE HYCLATE 100 MG PO TABS
100.0000 mg | ORAL_TABLET | Freq: Two times a day (BID) | ORAL | 0 refills | Status: DC
Start: 2020-05-31 — End: 2020-10-01

## 2020-05-31 NOTE — Therapy (Signed)
[place], [place], [address] Phone: [phone]   Fax:  [phone]  Physical Therapy Treatment  Patient Details  Name: Kayla Baker MRN: [id number] Date of Birth: [birth year] Referring Provider (PT): Heath Lark MD   Encounter Date: 05/31/2020   PT End of Session - 05/31/20 1356    Visit Number 2    Number of Visits 13    Date for PT Re-Evaluation 07/03/20    PT Start Time 1400    PT Stop Time 6384    PT Time Calculation (min) 55 min    Activity Tolerance Patient tolerated treatment well    Behavior During Therapy [facility] for tasks assessed/performed           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[place]) 02/29/2016   skin- 2016- squamous- on right upper arm   GERD (gastroesophageal reflux disease)    Gout    takes Allopurinol daily   History of chemotherapy    History of colon polyps    benign   History of shingles    Hx of radiation therapy    Hyperlipidemia    Hypertension    Ovarian ca ([place]) dx'd 03/2020   Personal history of chemotherapy    2017   Personal history of radiation therapy    2017   Vitamin D deficiency     Past Surgical History:  Procedure Laterality Date   ABDOMINAL HYSTERECTOMY  1973   BREAST BIOPSY Left 02/26/2016   BREAST LUMPECTOMY Left 03/18/2016   CARDIAC CATHETERIZATION     patient denies this procedure   CATARACT EXTRACTION, BILATERAL  2014   right eye 2/14; left eye 3/3   COLONOSCOPY     HAND SURGERY Left 2012   INCONTINENCE SURGERY  2006   IR IMAGING GUIDED PORT INSERTION  04/24/2020   IR PARACENTESIS  05/08/2020   IR PARACENTESIS  05/16/2020   IR PARACENTESIS  05/25/2020   KNEE ARTHROSCOPY Right    MASTOPEXY Right 06/16/2017   Procedure: RIGHT BREAST MASTOPEXY;  Surgeon: Irene Limbo, MD;  Location: [facility];  Service: Plastics;  Laterality: Right;   PORT-A-CATH REMOVAL N/A 11/27/2016   Procedure: REMOVAL PORT-A-CATH;  Surgeon: Fanny Skates, MD;  [facility];  Service: General;  Laterality: N/A;   PORTACATH PLACEMENT N/A 04/14/2016   Procedure: INSERTION PORT-A-CATH ;  Surgeon: Fanny Skates, MD;  Location: [facility];  Service: General;  Laterality: N/A;   RADIOACTIVE SEED GUIDED PARTIAL MASTECTOMY WITH AXILLARY SENTINEL LYMPH NODE BIOPSY Left 03/18/2016   Procedure: RADIOACTIVE SEED GUIDED LEFT PARTIAL MASTECTOMY WITH AXILLARY SENTINEL LYMPH NODE BIOPSY AND BLUE DYE INJECTION;  Surgeon: Fanny Skates, MD;  Location: [facility];  Service: General;  Laterality: Left;   REDUCTION MAMMAPLASTY Right 2018    There were no vitals filed for this visit.   Subjective Assessment - 05/31/20 1357    Subjective Pt states that she was able to look over the instructions for the wrapping prior to her visit today. She saw Dr. [name] because she has some redness on the top of her L foot with pain and was put on an antibiotic due to burning/pain.    Pertinent History Redemonstrated dominant mixed solid and cystic mass arising from the vicinity of the right ovary measuring at least 11.5  x 10.7 cm malignant orvarian mass, CKD stage III, possible chronic cirrhosis, history of L breast cancer with lumpectomy, chemotherapy and radiation.    Patient Stated Goals I want my legs to be normal.    Currently in Pain? Yes    Pain Score 6     Pain Location Foot    Pain Orientation Left    Pain Descriptors / Indicators Burning    Pain Type Acute pain    Pain Onset 1 to 4 weeks ago    Pain Frequency Constant                       [facility]patient [facility]  Lymphedema Life Impact Scale Total Score 52.94 %            [facility] Adult PT Treatment/Exercise - 05/31/20 0001      Manual Therapy   Manual  Therapy Manual Lymphatic Drainage (MLD);Compression Bandaging    Manual Lymphatic Drainage (MLD) Modified MLD for the bil LE with pt education throughout with emphasis on skin stretch, direction and pressure: Performing on one leg at a time avoiding abdomen due to diagnosis: 5 diaphragmatic breathes, inguinal and popliteal nodes, lateral thigh, medial to lateral thigh, lateral thigh, popliteal nodes, all surfaces of the lower leg, dorsum of the foot, re-worked all surfaces    Compression Bandaging only to the R lower leg due to pt has infection in dorsum of the L foot she just started antibiotic: thin stockinette,  1 inch elastomull for digits 1-5, 1 artiflex for shaping, 3 short stretch bandages 1 6 cm, 1 8 cm, 1 12 cm from foot to below the knee. Provided with wrapping material with 2 short stretch bandages for the L lower leg and instruction to not wrap until she finishes antibiotic.                   PT Education - 05/31/20 1710    Education Details Pt and spouse were provided with instruction on performing Modified LE MLD and for self wrapping at home below the knee. Educated that MLD is modified due to diagnosis and history of L breast cancer .    Person(s) Educated Patient;Spouse    Methods Explanation;Demonstration;Tactile cues;Verbal cues;Handout    Comprehension Returned demonstration;Verbalized understanding            PT Short Term Goals - 05/29/20 1504      PT SHORT TERM GOAL #1   Title Pt will be independent with self MLD and LE wrapping in order to demonstrate autonomy of care.    Baseline pt currently does not know how to wrap or perform MLD    Time 4    Period Weeks    Status New    Target Date 07/03/20             PT Long Term Goals - 05/29/20 1731      PT LONG TERM GOAL #1   Title Pt will demonstrate 1 cm reduction in the L ankle and 3 cm reduction in the R ankle, calf and thigh within 6 weeks to demonstrate decreased fluid.    Baseline see measurements     Time 4    Period Weeks    Status New    Target Date 07/03/20      PT LONG TERM GOAL #2   Title Pt will be measured for and fitted with appropriate compression garments to wear on a daily  basis for home management of lymphedema.    Baseline currently pt has no compression    Time 4    Period Weeks    Status New    Target Date 07/03/20      PT LONG TERM GOAL #3   Title Pt will perform 9 sit to stands in 30 seconds and perform TUG at 13.5 seconds or less in order to demonstrate decreased risk for falls.    Baseline 7 sit to stands in 30 seconds and 14.39 second TUG score.    Time 4    Period Weeks    Status New    Target Date 07/03/20      PT LONG TERM GOAL #4   Title Pt will report 50% improvement in pain/function within 4 weeks in order to demonstrate a subjective improvement in quality of life.    Baseline 7/10 pain    Time 4    Period Weeks    Status New    Target Date 07/03/20                 Plan - 05/31/20 1356    Clinical Impression Statement Pt continues with edema in her RLE with slight improvement in the LLE. Pt and spouse were educated on self wrapping and MLD for home management of edema with an emphasis on stretch, pressure and direction. Discussed precautions due to pt diagnosis and previous diagnosis. Pt demonstrated good softening of the R thigh following MLD. She was only wrapped foot to below the knee this session to see how she tolerates wrapping. Pt will benefit from continued POC at this time.    Personal Factors and Comorbidities Comorbidity 3+    Comorbidities Ovarion tumor, possible cirrhosis, CKD stage III, History of L breast cancer with radiation.    Rehab Potential Good    PT Frequency 3x / week    PT Duration 4 weeks    PT Treatment/Interventions Therapeutic activities;Therapeutic exercise;Balance training;Neuromuscular re-education;Manual techniques    PT Next Visit Plan Teach wrapping just to knee initially working toward groin slowly and  self MLD for the LE avoid the abdomen just perform MLD at the LE, avoid compression on abdomen due to ascites. Balance, endurance and LE strengthening as tolerated/needed.    PT Home Exercise Plan Access Code: [email or web address]    Consulted and Agree with Plan of Care Patient;Family member/caregiver           Patient will benefit from skilled therapeutic intervention in order to improve the following deficits and impairments:  Decreased strength, Pain, Increased edema, Decreased balance  Visit Diagnosis: Malignant neoplasm of right ovary (HCC)  Balance disorder  Risk for falls  Pain in both feet  Lymphedema, not elsewhere classified     Problem List Patient Active Problem List   Diagnosis Date Noted   Cellulitis of foot, left 05/31/2020   Pancytopenia, acquired ([place]) 05/15/2020   Cancer associated pain 05/15/2020   Malignant ascites 05/07/2020   Anemia in neoplastic disease 05/03/2020   Goals of care, counseling/discussion 04/20/2020   Ovarian cancer ([place]) 04/19/2020   Carcinomatosis ([place]) 04/17/2020   Leg edema, right 04/05/2020   Incontinence of urine in female 04/04/2020   CKD stage 3 due to type 2 diabetes mellitus ([place]) 07/05/2018   Port catheter in place 09/18/2016   Chemotherapy-induced peripheral neuropathy ([place]) 08/07/2016   Breast cancer of upper-inner quadrant of left female breast ([place]) 02/29/2016   Gout 12/08/2014   Diabetic neuropathy, painful ([place]) 08/28/2014   Medication  management 08/27/2014   Essential hypertension 11/10/2013   Hyperlipidemia associated with type 2 diabetes mellitus ([place]) 11/10/2013   T2_NIDDM w/CKD3 ([place]) 11/10/2013   Vitamin D deficiency 11/10/2013    Ander Purpura, PT 05/31/2020, 5:18 PM  [place], [place], [address] Phone: [phone]   Fax:  [phone]  Name: Kayla Baker MRN: [id number] Date of Birth: [birth year]

## 2020-05-31 NOTE — Telephone Encounter (Signed)
Called and given appt for paracentesis for tomorrow at 2:30 pm at [facility]. Instructed to arrive at 2:15 pm. She verbalized understanding.

## 2020-05-31 NOTE — Progress Notes (Signed)
[place] OFFICE PROGRESS NOTE  Patient Care Team: [name], MD as PCP - Kinnie Scales, OD as Referring Physician (Optometry) [name], MD as Consulting Physician (Dermatology) Latanya Maudlin, MD as Consulting Physician (Orthopedic Surgery) Inda Castle, MD (Inactive) as Consulting Physician (Gastroenterology) Awanda Mink Craige Cotta, RN as Oncology Nurse Navigator (Oncology) Heath Lark, MD as Consulting Physician (Hematology and Oncology)  ASSESSMENT & PLAN:  Ovarian cancer [facility]) She tolerated recent chemo better. However, she felt somewhat bloated with distention of her abdomen On examination, in addition to persistent carcinomatosis, I felt that her abdominal distention is consistent with malignant ascites I will order repeat paracentesis for symptomatic relief I plan to see her again next week for further follow-up  Cellulitis of foot, left She has a hint of soft tissue erythema changes, worrisome for cellulitis Due to penicillin allergy, I will prescribe doxycycline I will see her next week for further assessment and review  Leg edema, right She has bilateral lower extremity edema She will continue physical therapy for lymphedema treatment   Orders Placed This Encounter  Procedures   US Paracentesis    Standing Status:   Future    Standing Expiration Date:   05/31/2021    Order Specific Question:   If therapeutic, is there a maximum amount of fluid to be removed?    Answer:   Yes    Order Specific Question:   What is the maximum amount of fluide to be removed?    Answer:   5 liters    Order Specific Question:   Are labs required for specimen collection?    Answer:   No    Order Specific Question:   Is Albumin medication needed?    Answer:   No    Order Specific Question:   Reason for Exam (SYMPTOM  OR DIAGNOSIS REQUIRED)    Answer:   recurrent ascites    Order Specific Question:   Preferred imaging location?    Answer:   [facility]    All questions were answered. The patient knows to call the clinic with any problems, questions or concerns. The total time spent in the appointment was 20 minutes encounter with patients including review of chart and various tests results, discussions about plan of care and coordination of care plan   Heath Lark, MD 05/31/2020 9:52 AM  INTERVAL HISTORY: Please see below for problem oriented charting. She returns with her husband for further follow-up She noticed new skin changes affecting the left foot Her left foot was edematous.  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[place])  02/26/2016 Initial Diagnosis   Left breast biopsy 11:00 position: invasive ductal carcinoma with DCIS, ER 90%, PR 10%, HER-2 negative, Ki-67 30%, grade 2, 2.2 cm palpable lesion T2 N0 stage II a clinical stage   03/18/2016 Surgery   Left lumpectomy: Invasive ductal carcinoma, grade 2, 6.3 cm, with high-grade DCIS, margins negative, 0/4 lymph nodes negative, ER 90%, via 10%, HER-2 negative ratio 0.97, Ki-67 30%, T3 N0 stage IIB  03/25/2016 Procedure   Genetic testing is negative for pathogenic mutations within any of the 20 Genes on the breast/ovarian cancer panel   04/04/2016 Oncotype testing   Oncotype DX recurrence score 37, 25% 10 year distant risk of recurrence   04/17/2016 - 07/31/2016 Chemotherapy   Adjuvant chemotherapy with dose dense Adriamycin and Cytoxan followed by Abraxane weekly 8 ( discontinued due to neuropathy)    09/01/2016 - 09/26/2016 Radiation Therapy   Adj XRT 1) Left breast: 42.5 Gy in 17 fractions. 2) Left breast boost: 7.5 Gy in 3 fractions.   12/02/2016 -  Anti-estrogen oral therapy   Anastrozole 1 mg daily   Ovarian cancer ([place])  04/03/2020 Imaging   US pelvis Complex cystic and solid mass in LEFT adnexa 12.5 cm diameter question cystic ovarian neoplasm; recommend correlation with serum tumor markers and further evaluation by MR imaging with and without contrast.     04/04/2020 Imaging   US venous Doppler No evidence of deep venous thrombosis in the right lower extremity. Left common femoral vein also patent.   04/15/2020 Imaging   MRI pelvis 1. Large complex solid and cystic mass arising from the right adnexa measuring 10.8 by 11.5 by 11.1 cm. This has an aggressive appearance with extensive enhancing mural soft tissue components. Findings are highly suspicious for malignant ovarian neoplasm. 2. Extensive bilateral retroperitoneal and bilateral iliac adenopathy compatible with metastatic disease. 3. Signs of extensive peritoneal carcinomatosis including ascites, enhancement, thickening and nodularity of the peritoneal reflections, omental caking and bulky peritoneal nodularity. 4. Suspected serosal involvement of the dome of bladder with loss of normal fat plane. Cannot rule out mural invasion by tumor.   04/17/2020 Tumor Marker   Patient's tumor was tested for the following markers: CA-125 Results of the tumor marker test revealed 1762.   04/20/2020 Cancer Staging   Staging form: Ovary, Fallopian Tube, and Primary Peritoneal Carcinoma, AJCC 8th Edition - Clinical: FIGO Stage IIIC (cT3, cN1, cM0) - Signed by Heath Lark, MD on 04/20/2020   04/23/2020 Imaging   1. Redemonstrated dominant mixed solid and cystic mass arising from the vicinity of the right ovary measuring at least 11.5 x 10.7 cm, not significantly changed compared to prior MR, and consistent with primary ovarian malignancy.  2.  Numerous bulky retroperitoneal, bilateral iliac, and pelvic sidewall lymph nodes. 3. Moderate volume ascites throughout the abdomen and pelvis with subtle thickening and nodularity throughout the peritoneum, and extensive bulky nodular metastatic disease of the omentum. 4. Constellation of findings is consistent with advanced nodal and peritoneal metastatic disease. 5. There are prominent subcentimeter epicardial lymph nodes, nonspecific although suspicious for nodal metastatic disease. No definite nodal metastatic disease in the chest. Attention on follow-up. 6. There are multiple small subpleural nodules at the right lung base overlying the diaphragm, measuring up to 7 mm. These are generally nonspecific and less favored to represent pulmonary metastatic disease given distribution. Attention on follow-up. 7. Somewhat coarse contour of the liver, suggestive of cirrhosis, although out without overt morphologic stigmata. 8. Aortic Atherosclerosis (ICD10-I70.0).     04/24/2020 Procedure   Successful placement of a right internal jugular approach power injectable Port-A-Cath. The catheter is ready for immediate use.     05/02/2020 Tumor Marker   Patient's tumor was tested for the following markers: CA-125 Results of the tumor marker test revealed [id number]   05/03/2020 -  Chemotherapy   The patient had carboplatin and taxol for chemotherapy treatment.     05/08/2020 Procedure   Successful ultrasound-guided paracentesis  yielding 2.6 liters of peritoneal fluid.   05/16/2020 Procedure   Successful ultrasound-guided paracentesis yielding 3.2 liters of peritoneal fluid   05/25/2020 Procedure   Successful ultrasound-guided paracentesis yielding 3 liters of peritoneal fluid.       REVIEW OF SYSTEMS:   Constitutional: Denies fevers, chills or abnormal weight loss Eyes: Denies blurriness of vision Ears, nose, mouth, throat, and face: Denies mucositis or sore throat Respiratory: Denies cough, dyspnea or  wheezes Cardiovascular: Denies palpitation, chest discomfort  Lymphatics: Denies new lymphadenopathy or easy bruising Neurological:Denies numbness, tingling or new weaknesses Behavioral/Psych: Mood is stable, no new changes  All other systems were reviewed with the patient and are negative.  I have reviewed the past medical history, past surgical history, social history and family history with the patient and they are unchanged from previous note.  ALLERGIES:  is allergic to keflex [cephalexin], meloxicam, prednisone, premarin [estrogens conjugated], and trovan [alatrofloxacin].  MEDICATIONS:  Current Outpatient Medications  Medication Sig Dispense Refill   acetaminophen (TYLENOL) 325 MG tablet Take 325 mg by mouth every 6 (six) hours as needed for headache.     allopurinol (ZYLOPRIM) 300 MG tablet Take 1/2 tablet Daily to Prevent Gout 90 tablet 3   blood glucose meter kit and supplies KIT Dispense based on patient and insurance preference. Use to check blood sugar once daily. Dx: E11.22, N18.30 1 each 12   Cholecalciferol (VITAMIN D3) 5000 units CAPS Take 5,000 Units by mouth daily.     dexamethasone (DECADRON) 4 MG tablet Take 2 tabs at the night before and 2 tab the morning of chemotherapy, every 3 weeks, by mouth 36 tablet 5   doxycycline (VIBRA-TABS) 100 MG tablet Take 1 tablet (100 mg total) by mouth 2 (two) times daily. 14 tablet 0   gabapentin (NEURONTIN) 300 MG capsule Take 1 capsule 3 to 4  x /day as needed for pain (Patient taking differently: Take 300 mg by mouth See admin instructions. Take 300 mg 3 times daily may take a 4th 300 mg dose as needed for pain) 360 capsule 3   glimepiride (AMARYL) 1 MG tablet Take 1 tab by mouth in the morning if fasting is running 150+. Please do not take this medication if you are ill or not eating as this can cause a low blood sugar. 90 tablet 1   glucose blood (FREESTYLE TEST STRIPS) test strip Check Blood Sugar Daily as directed (Dx:  E11.29) 100 strip 3   HYDROcodone-acetaminophen (NORCO/VICODIN) 5-325 MG tablet Take 1 tablet by mouth every 6 (six) hours as needed for moderate pain. 30 tablet 0   Lancets (FREESTYLE) lancets CHECK BLOOD SUGAR DAILY AS DIRECTED 100 each 3   lidocaine-prilocaine (EMLA) cream Apply to affected area once (Patient taking differently: Apply 1 application topically daily as needed (port access). ) 30 g 3   loratadine (CLARITIN) 10 MG tablet Take 10 mg by mouth daily as needed for allergies.     Magnesium ([employer] MAGNESIUM) 250 MG TABS Take 250 mg by mouth 2 (two) times daily.     metoprolol succinate (TOPROL-XL) 25 MG 24 hr tablet Take 1 tablet Daily for BP (Patient taking differently: Take 25 mg by mouth daily. ) 90 tablet 3   mirabegron ER (MYRBETRIQ) 50 MG TB24 tablet Take 1 tablet (50 mg total) by mouth daily. 30 tablet 2   ondansetron (ZOFRAN) 8 MG tablet Take 1 tablet (8 mg total) by mouth 2 (two) times daily as needed for refractory nausea / vomiting. Start on  day 3 after carboplatin chemo. (Patient not taking: Reported on 05/09/2020) 30 tablet 1   prochlorperazine (COMPAZINE) 10 MG tablet Take 1 tablet (10 mg total) by mouth every 6 (six) hours as needed for nausea or vomiting. (Patient not taking: Reported on 05/09/2020) 60 tablet 9   vitamin C (ASCORBIC ACID) 500 MG tablet Take 500 mg by mouth every evening.      No current facility-administered medications for this visit.    PHYSICAL EXAMINATION: ECOG PERFORMANCE STATUS: 1 - Symptomatic but completely ambulatory  Vitals:   05/31/20 0935  BP: (!) 120/55  Pulse: (!) 103  Resp: 20  Temp: 98.3 F (36.8 C)  SpO2: 99%   Filed Weights   05/31/20 0935  Weight: 144 lb 3.2 oz (65.4 kg)    GENERAL:alert, no distress and comfortable SKIN: Noted skin erythema affecting the left foot worrisome for early signs of cellulitis  ABDOMEN:abdomen is distended consistent with recurrent malignant ascites Musculoskeletal: Noted bilateral lower  extremity edema NEURO: alert & oriented x 3 with fluent speech, no focal motor/sensory deficits  LABORATORY DATA:  I have reviewed the data as listed    Component Value Date/Time   NA 137 05/24/2020 0807   NA 141 11/06/2016 1102   K 4.2 05/24/2020 0807   K 3.9 11/06/2016 1102   CL 105 05/24/2020 0807   CO2 20 (L) 05/24/2020 0807   CO2 27 11/06/2016 1102   GLUCOSE 173 (H) 05/24/2020 0807   GLUCOSE 130 11/06/2016 1102   BUN 30 (H) 05/24/2020 0807   BUN 18.0 11/06/2016 1102   CREATININE 1.54 (H) 05/24/2020 0807   CREATININE 1.48 (H) 05/09/2020 1115   CREATININE 1.3 (H) 11/06/2016 1102   CALCIUM 9.2 05/24/2020 0807   CALCIUM 10.2 11/06/2016 1102   PROT 6.1 (L) 05/24/2020 0807   PROT 7.1 11/06/2016 1102   ALBUMIN 2.5 (L) 05/24/2020 0807   ALBUMIN 3.7 11/06/2016 1102   AST 42 (H) 05/24/2020 0807   AST 52 (H) 11/06/2016 1102   ALT 19 05/24/2020 0807   ALT 58 (H) 11/06/2016 1102   ALKPHOS 227 (H) 05/24/2020 0807   ALKPHOS 159 (H) 11/06/2016 1102   BILITOT 0.2 (L) 05/24/2020 0807   BILITOT 0.53 11/06/2016 1102   GFRNONAA 31 (L) 05/24/2020 0807   GFRNONAA 33 (L) 05/09/2020 1115   GFRAA 36 (L) 05/24/2020 0807   GFRAA 38 (L) 05/09/2020 1115    No results found for: SPEP, UPEP  Lab Results  Component Value Date   WBC 7.3 05/24/2020   NEUTROABS 6.2 05/24/2020   HGB 9.7 (L) 05/24/2020   HCT 29.6 (L) 05/24/2020   MCV 86.5 05/24/2020   PLT 421 (H) 05/24/2020      Chemistry      Component Value Date/Time   NA 137 05/24/2020 0807   NA 141 11/06/2016 1102   K 4.2 05/24/2020 0807   K 3.9 11/06/2016 1102   CL 105 05/24/2020 0807   CO2 20 (L) 05/24/2020 0807   CO2 27 11/06/2016 1102   BUN 30 (H) 05/24/2020 0807   BUN 18.0 11/06/2016 1102   CREATININE 1.54 (H) 05/24/2020 0807   CREATININE 1.48 (H) 05/09/2020 1115   CREATININE 1.3 (H) 11/06/2016 1102      Component Value Date/Time   CALCIUM 9.2 05/24/2020 0807   CALCIUM 10.2 11/06/2016 1102   ALKPHOS 227 (H) 05/24/2020  0807   ALKPHOS 159 (H) 11/06/2016 1102   AST 42 (H) 05/24/2020 0807   AST 52 (H) 11/06/2016 1102  ALT 19 05/24/2020 0807   ALT 58 (H) 11/06/2016 1102   BILITOT 0.2 (L) 05/24/2020 0807   BILITOT 0.53 11/06/2016 1102

## 2020-05-31 NOTE — Patient Instructions (Signed)
PLEASE KEEP YOUR BANDAGES ON AS LONG AS POSSIBLE TO GET THE BEST SWELLING REDUCTION. Should your bandages become uncomfortable or feel too tight, follow these steps: 1. Elevate your extremity higher than your heart.  2. Try to move your arm or leg joints against the firmness of the bandage to help with moving the fluid and allow the bandages to loosen a bit.  3. If the bandaging is still is too tight, it is ok to carefully remove the top layer.  There will still be more layers under it that can provide compression to your extremity. 4. Finally, if you STILL have significant pain after trying these steps, it is ok to take the bandage off.  Check your skin carefully for any signs of irritation  5. PLEASE bring ALL bandage materials back to your next appointment as we will reuse what we can TAKE CARE OF YOUR BANDAGES SO THEY WILL LAST LONGER AND STAY IN BETTER CONDITION Washing bandages:  Wash periodically using a mild detergent in warm water.  Do not use fabric softener or bleach.  Place bandages in a mesh lingerie bag or in a tied off pillow case and use the gentle cycle of the washing machine or hand wash. If you hand wash, you may want to put them in the spin cycle of your washer to get the extra water out, but make sure you put them in a mesh bag first. Do not wring or stretch them while they are wet.  Drying bandages: Lay the bandages out smoothly on a towel away from direct sunlight or heating sources that can damage the fabric. Rolling bandages in a towel and gently squeezing the towel to remove excess water before laying them out can speed up the process.  If you use a drying rack, place a towel on top of the rack to lay the bandages on.  If they hang down to dry, they fabric could be stretched out and the bandage will lose its compression.   Or, keep bandages in the mesh bag and dry them in the dryer on the low or no heat cycle. Rolling bandages: Please roll your bandages after drying them so they  are ready for your next treatment. If they are rolled too loose, they will be difficult to apply.  If rolled too tight, they can get stretched out.   TAKE CARE OF YOUR SKIN 1. Apply a low pH moisturizing lotion to your skin daily 2. Avoid scratching your skin 3. Treat skin irritations quickly  4. Know the 5 warning signs of infection: redness, pain, warmth to touch, fever and increased swelling.  Call your physician immediately if you notice any of these signs of a possible infection.    Deep Effective Breath   Standing, sitting, or laying down place both hands on the belly. Take a deep breath IN, expanding the belly; then breath OUT, contracting the belly. Repeat __5__ times. Do __2-3__ sessions per day and before each self massage.  http://gt2.exer.us/866    Copyright  VHI. All rights reserved.  LEG: Knee to Hip - Clear   Pump up outer thigh of involved leg from knee to outer hip. Then do stationary circles from inner to outer thigh, then do outer thigh again. Next, interlace fingers behind knee IF ABLE and make in-place circles. Do _5_ times of each sequence.  Do _1__ time per day.  Copyright  VHI. All rights reserved.  LEG: Ankle to Hip Sweep   Hands on sides of ankle  of involved leg, pump _5__ times up both sides of lower leg, then retrace steps up outer thigh to hip as before and back to pathway. Do _2-3_ times. Do __1_ time per day.  Copyright  VHI. All rights reserved.  FOOT: Dorsum of Foot and Toes Massage   One hand on top of foot make _5_ stationary circles or pumps, then either on top of toes or each individual toe do _5_ pumps. Then retrace all steps pumping back up both sides of lower leg, outer thigh, and then pathway. Finish with what you started with, _5_ circles at involved side arm pit. All _2-3_ times at each sequence. Do _1__ time per day.  Copyright  VHI. All rights reserved.

## 2020-05-31 NOTE — Assessment & Plan Note (Signed)
She has bilateral lower extremity edema She will continue physical therapy for lymphedema treatment

## 2020-05-31 NOTE — Assessment & Plan Note (Signed)
She tolerated recent chemo better. However, she felt somewhat bloated with distention of her abdomen On examination, in addition to persistent carcinomatosis, I felt that her abdominal distention is consistent with malignant ascites I will order repeat paracentesis for symptomatic relief I plan to see her again next week for further follow-up

## 2020-05-31 NOTE — Assessment & Plan Note (Signed)
She has a hint of soft tissue erythema changes, worrisome for cellulitis Due to penicillin allergy, I will prescribe doxycycline I will see her next week for further assessment and review

## 2020-06-01 ENCOUNTER — Ambulatory Visit (HOSPITAL_COMMUNITY)
Admission: RE | Admit: 2020-06-01 | Discharge: 2020-06-01 | Disposition: A | Discharge status: Home / Self Care | Payer: PPO | Source: Ambulatory Visit | Attending: Hematology and Oncology | Admitting: Hematology and Oncology

## 2020-06-01 DIAGNOSIS — R180 Malignant ascites: Secondary | ICD-10-CM

## 2020-06-01 DIAGNOSIS — C569 Malignant neoplasm of unspecified ovary: Secondary | ICD-10-CM | POA: Diagnosis not present

## 2020-06-01 DIAGNOSIS — C561 Malignant neoplasm of right ovary: Secondary | ICD-10-CM

## 2020-06-01 MED ORDER — LIDOCAINE HCL 1 % IJ SOLN
INTRAMUSCULAR | Status: AC
  Filled 2020-06-01: qty 20

## 2020-06-01 NOTE — Procedures (Signed)
Ultrasound-guided  therapeutic paracentesis performed yielding 1.7  liters of blood-tinged fluid. No immediate complications. EBL none.

## 2020-06-05 ENCOUNTER — Telehealth: Payer: Self-pay | Admitting: Hematology and Oncology

## 2020-06-05 ENCOUNTER — Telehealth: Payer: Self-pay

## 2020-06-05 ENCOUNTER — Inpatient Hospital Stay: Payer: PPO | Admitting: Hematology and Oncology

## 2020-06-05 ENCOUNTER — Other Ambulatory Visit: Payer: Self-pay

## 2020-06-05 ENCOUNTER — Encounter: Payer: Self-pay | Admitting: Hematology and Oncology

## 2020-06-05 VITALS — BP 118/57 | HR 89 | Temp 98.3°F | Resp 18 | Ht 64.0 in | Wt 142.2 lb

## 2020-06-05 DIAGNOSIS — E440 Moderate protein-calorie malnutrition: Secondary | ICD-10-CM | POA: Diagnosis not present

## 2020-06-05 DIAGNOSIS — R180 Malignant ascites: Secondary | ICD-10-CM | POA: Diagnosis not present

## 2020-06-05 DIAGNOSIS — R600 Localized edema: Secondary | ICD-10-CM | POA: Diagnosis not present

## 2020-06-05 DIAGNOSIS — C561 Malignant neoplasm of right ovary: Secondary | ICD-10-CM | POA: Diagnosis not present

## 2020-06-05 DIAGNOSIS — Z5111 Encounter for antineoplastic chemotherapy: Secondary | ICD-10-CM | POA: Diagnosis not present

## 2020-06-05 NOTE — Assessment & Plan Note (Signed)
She has signs of significant protein calorie malnutrition We discussed importance of high-protein intake Her recurrent paracentesis also cause protein loss

## 2020-06-05 NOTE — Assessment & Plan Note (Signed)
She has recurrent significant ascites She did not have pleasant experience last week from interventional radiologist The patient requests future paracentesis to be done at [facility] We will try to schedule that to be done again at the end of the week Her abdomen appears somewhat distended We discussed the risk and benefits of recurrent paracentesis and she is in agreement to proceed

## 2020-06-05 NOTE — Progress Notes (Signed)
[place] OFFICE PROGRESS NOTE  Patient Care Team: [name], MD as PCP - Kinnie Scales, OD as Referring Physician (Optometry) [name], MD as Consulting Physician (Dermatology) Latanya Maudlin, MD as Consulting Physician (Orthopedic Surgery) Inda Castle, MD (Inactive) as Consulting Physician (Gastroenterology) Jacqulyn Liner, RN as Oncology Nurse Navigator (Oncology) Heath Lark, MD as Consulting Physician (Hematology and Oncology)  ASSESSMENT & PLAN:  Ovarian cancer [facility]) She continues to have significant leg swelling and recurrent malignant ascites Continue supportive care  Malignant ascites She has recurrent significant ascites She did not have pleasant experience last week from interventional radiologist The patient requests future paracentesis to be done at [facility] We will try to schedule that to be done again at the end of the week Her abdomen appears somewhat distended We discussed the risk and benefits of recurrent paracentesis and she is in agreement to proceed  Leg edema, right She has bilateral lower extremity edema She will continue physical therapy for lymphedema treatment  Protein-calorie malnutrition, moderate ([place]) She has signs of significant protein calorie malnutrition We discussed importance of high-protein intake Her recurrent paracentesis also cause protein loss   Orders Placed This Encounter  Procedures   IR Paracentesis    Standing Status:   Future    Standing Expiration Date:   06/05/2021    Order Specific Question:   If therapeutic, is there a maximum amount of fluid to be removed?    Answer:   Yes    Order Specific Question:   What is the maximum amount of fluide to be removed?    Answer:   5 liters    Order Specific Question:   Are labs required for specimen collection?    Answer:   No    Order Specific Question:   Is Albumin medication needed?    Answer:   No    Order Specific Question:   Reason for  Exam (SYMPTOM  OR DIAGNOSIS REQUIRED)    Answer:   malignant ascites    Order Specific Question:   Preferred Imaging Location?    Answer:   [facility]    All questions were answered. The patient knows to call the clinic with any problems, questions or concerns. The total time spent in the appointment was 20 minutes encounter with patients including review of chart and various tests results, discussions about plan of care and coordination of care plan   Heath Lark, MD 06/05/2020 3:02 PM  INTERVAL HISTORY: Please see below for problem oriented charting. She returns with her husband for further follow-up She did not feel any difference with antibiotic therapy for left foot redness She continues aggressive physical therapy and lymphedema treatment for right leg swelling Her left foot is also swollen She had unpleasant experience at the interventional radiologist last week She did not feel that they have drained her ascites adequately and she waited for some time for the procedure Her abdomen appears distended again She denies nausea, pain or changes in bowel habits  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[place])  02/26/2016 Initial Diagnosis   Left breast biopsy 11:00 position: invasive ductal carcinoma with DCIS, ER 90%, PR 10%, HER-2 negative, Ki-67 30%, grade 2, 2.2 cm palpable lesion T2 N0 stage II a clinical stage   03/18/2016 Surgery   Left lumpectomy: Invasive ductal carcinoma, grade 2, 6.3 cm, with high-grade DCIS, margins negative, 0/4 lymph nodes negative, ER 90%,  via 10%, HER-2 negative ratio 0.97, Ki-67 30%, T3 N0 stage IIB   03/25/2016 Procedure   Genetic testing is negative for pathogenic mutations within any of the 20 Genes on the breast/ovarian cancer panel   04/04/2016 Oncotype testing   Oncotype DX recurrence score 37, 25% 10 year distant risk of recurrence   04/17/2016 - 07/31/2016 Chemotherapy    Adjuvant chemotherapy with dose dense Adriamycin and Cytoxan followed by Abraxane weekly 8 ( discontinued due to neuropathy)   09/01/2016 - 09/26/2016 Radiation Therapy   Adj XRT 1) Left breast: 42.5 Gy in 17 fractions. 2) Left breast boost: 7.5 Gy in 3 fractions.   12/02/2016 -  Anti-estrogen oral therapy   Anastrozole 1 mg daily   Ovarian cancer ([place])  04/03/2020 Imaging   US pelvis Complex cystic and solid mass in LEFT adnexa 12.5 cm diameter question cystic ovarian neoplasm; recommend correlation with serum tumor markers and further evaluation by MR imaging with and without contrast.     04/04/2020 Imaging   US venous Doppler No evidence of deep venous thrombosis in the right lower extremity. Left common femoral vein also patent.   04/15/2020 Imaging   MRI pelvis 1. Large complex solid and cystic mass arising from the right adnexa measuring 10.8 by 11.5 by 11.1 cm. This has an aggressive appearance with extensive enhancing mural soft tissue components. Findings are highly suspicious for malignant ovarian neoplasm. 2. Extensive bilateral retroperitoneal and bilateral iliac adenopathy compatible with metastatic disease. 3. Signs of extensive peritoneal carcinomatosis including ascites, enhancement, thickening and nodularity of the peritoneal reflections, omental caking and bulky peritoneal nodularity. 4. Suspected serosal involvement of the dome of bladder with loss of normal fat plane. Cannot rule out mural invasion by tumor.   04/17/2020 Tumor Marker   Patient's tumor was tested for the following markers: CA-125 Results of the tumor marker test revealed 1762.   04/20/2020 Cancer Staging   Staging form: Ovary, Fallopian Tube, and Primary Peritoneal Carcinoma, AJCC 8th Edition - Clinical: FIGO Stage IIIC (cT3, cN1, cM0) - Signed by Heath Lark, MD on 04/20/2020   04/23/2020 Imaging   1. Redemonstrated dominant mixed solid and cystic mass arising from the vicinity of the right ovary  measuring at least 11.5 x 10.7 cm, not significantly changed compared to prior MR, and consistent with primary ovarian malignancy.  2. Numerous bulky retroperitoneal, bilateral iliac, and pelvic sidewall lymph nodes. 3. Moderate volume ascites throughout the abdomen and pelvis with subtle thickening and nodularity throughout the peritoneum, and extensive bulky nodular metastatic disease of the omentum. 4. Constellation of findings is consistent with advanced nodal and peritoneal metastatic disease. 5. There are prominent subcentimeter epicardial lymph nodes, nonspecific although suspicious for nodal metastatic disease. No definite nodal metastatic disease in the chest. Attention on follow-up. 6. There are multiple small subpleural nodules at the right lung base overlying the diaphragm, measuring up to 7 mm. These are generally nonspecific and less favored to represent pulmonary metastatic disease given distribution. Attention on follow-up. 7. Somewhat coarse contour of the liver, suggestive of cirrhosis, although out without overt morphologic stigmata. 8. Aortic Atherosclerosis (ICD10-I70.0).     04/24/2020 Procedure   Successful placement of a right internal jugular approach power injectable Port-A-Cath. The catheter is ready for immediate use.     05/02/2020 Tumor Marker   Patient's tumor was tested for the following markers: CA-125 Results of the tumor marker test revealed [id number]   05/03/2020 -  Chemotherapy   The patient had carboplatin and taxol  for chemotherapy treatment.     05/08/2020 Procedure   Successful ultrasound-guided paracentesis yielding 2.6 liters of peritoneal fluid.   05/16/2020 Procedure   Successful ultrasound-guided paracentesis yielding 3.2 liters of peritoneal fluid   05/25/2020 Procedure   Successful ultrasound-guided paracentesis yielding 3 liters of peritoneal fluid.     06/01/2020 Procedure   Successful ultrasound-guided therapeutic paracentesis yielding 1.7 liters  of peritoneal fluid.     REVIEW OF SYSTEMS:   Constitutional: Denies fevers, chills or abnormal weight loss Eyes: Denies blurriness of vision Ears, nose, mouth, throat, and face: Denies mucositis or sore throat Respiratory: Denies cough, dyspnea or wheezes Cardiovascular: Denies palpitation, chest discomfort Gastrointestinal:  Denies nausea, heartburn or change in bowel habits Lymphatics: Denies new lymphadenopathy or easy bruising Neurological:Denies numbness, tingling or new weaknesses Behavioral/Psych: Mood is stable, no new changes  All other systems were reviewed with the patient and are negative.  I have reviewed the past medical history, past surgical history, social history and family history with the patient and they are unchanged from previous note.  ALLERGIES:  is allergic to keflex [cephalexin], meloxicam, prednisone, premarin [estrogens conjugated], and trovan [alatrofloxacin].  MEDICATIONS:  Current Outpatient Medications  Medication Sig Dispense Refill   acetaminophen (TYLENOL) 325 MG tablet Take 325 mg by mouth every 6 (six) hours as needed for headache.     allopurinol (ZYLOPRIM) 300 MG tablet Take 1/2 tablet Daily to Prevent Gout 90 tablet 3   blood glucose meter kit and supplies KIT Dispense based on patient and insurance preference. Use to check blood sugar once daily. Dx: E11.22, N18.30 1 each 12   Cholecalciferol (VITAMIN D3) 5000 units CAPS Take 5,000 Units by mouth daily.     dexamethasone (DECADRON) 4 MG tablet Take 2 tabs at the night before and 2 tab the morning of chemotherapy, every 3 weeks, by mouth 36 tablet 5   doxycycline (VIBRA-TABS) 100 MG tablet Take 1 tablet (100 mg total) by mouth 2 (two) times daily. 14 tablet 0   gabapentin (NEURONTIN) 300 MG capsule Take 1 capsule 3 to 4  x /day as needed for pain (Patient taking differently: Take 300 mg by mouth See admin instructions. Take 300 mg 3 times daily may take a 4th 300 mg dose as needed for  pain) 360 capsule 3   glimepiride (AMARYL) 1 MG tablet Take 1 tab by mouth in the morning if fasting is running 150+. Please do not take this medication if you are ill or not eating as this can cause a low blood sugar. 90 tablet 1   glucose blood (FREESTYLE TEST STRIPS) test strip Check Blood Sugar Daily as directed (Dx: E11.29) 100 strip 3   HYDROcodone-acetaminophen (NORCO/VICODIN) 5-325 MG tablet Take 1 tablet by mouth every 6 (six) hours as needed for moderate pain. 30 tablet 0   Lancets (FREESTYLE) lancets CHECK BLOOD SUGAR DAILY AS DIRECTED 100 each 3   lidocaine-prilocaine (EMLA) cream Apply to affected area once (Patient taking differently: Apply 1 application topically daily as needed (port access). ) 30 g 3   loratadine (CLARITIN) 10 MG tablet Take 10 mg by mouth daily as needed for allergies.     Magnesium ([employer] MAGNESIUM) 250 MG TABS Take 250 mg by mouth 2 (two) times daily.     metoprolol succinate (TOPROL-XL) 25 MG 24 hr tablet Take 1 tablet Daily for BP (Patient taking differently: Take 25 mg by mouth daily. ) 90 tablet 3   mirabegron ER (MYRBETRIQ) 50 MG TB24 tablet Take  1 tablet (50 mg total) by mouth daily. 30 tablet 2   ondansetron (ZOFRAN) 8 MG tablet Take 1 tablet (8 mg total) by mouth 2 (two) times daily as needed for refractory nausea / vomiting. Start on day 3 after carboplatin chemo. (Patient not taking: Reported on 05/09/2020) 30 tablet 1   prochlorperazine (COMPAZINE) 10 MG tablet Take 1 tablet (10 mg total) by mouth every 6 (six) hours as needed for nausea or vomiting. (Patient not taking: Reported on 05/09/2020) 60 tablet 9   vitamin C (ASCORBIC ACID) 500 MG tablet Take 500 mg by mouth every evening.      No current facility-administered medications for this visit.    PHYSICAL EXAMINATION: ECOG PERFORMANCE STATUS: 2 - Symptomatic, <50% confined to bed  Vitals:   06/05/20 1436  BP: (!) 118/57  Pulse: 89  Resp: 18  Temp: 98.3 F (36.8 C)  SpO2: 100%    Filed Weights   06/05/20 1436  Weight: 142 lb 3.2 oz (64.5 kg)    GENERAL:alert, no distress and comfortable SKIN: The appearance of her skin on the left foot is unchanged despite antibiotic treatment EYES: normal, Conjunctiva are pink and non-injected, sclera clear OROPHARYNX:no exudate, no erythema and lips, buccal mucosa, and tongue normal  NECK: supple, thyroid normal size, non-tender, without nodularity LYMPH:  no palpable lymphadenopathy in the cervical, axillary or inguinal LUNGS: clear to auscultation and percussion with normal breathing effort HEART: regular rate & rhythm and no murmurs and with moderate bilateral lower extremity edema, right greater than left ABDOMEN:abdomen soft, distended with ascites Musculoskeletal:no cyanosis of digits and no clubbing  NEURO: alert & oriented x 3 with fluent speech, no focal motor/sensory deficits  LABORATORY DATA:  I have reviewed the data as listed    Component Value Date/Time   NA 137 05/24/2020 0807   NA 141 11/06/2016 1102   K 4.2 05/24/2020 0807   K 3.9 11/06/2016 1102   CL 105 05/24/2020 0807   CO2 20 (L) 05/24/2020 0807   CO2 27 11/06/2016 1102   GLUCOSE 173 (H) 05/24/2020 0807   GLUCOSE 130 11/06/2016 1102   BUN 30 (H) 05/24/2020 0807   BUN 18.0 11/06/2016 1102   CREATININE 1.54 (H) 05/24/2020 0807   CREATININE 1.48 (H) 05/09/2020 1115   CREATININE 1.3 (H) 11/06/2016 1102   CALCIUM 9.2 05/24/2020 0807   CALCIUM 10.2 11/06/2016 1102   PROT 6.1 (L) 05/24/2020 0807   PROT 7.1 11/06/2016 1102   ALBUMIN 2.5 (L) 05/24/2020 0807   ALBUMIN 3.7 11/06/2016 1102   AST 42 (H) 05/24/2020 0807   AST 52 (H) 11/06/2016 1102   ALT 19 05/24/2020 0807   ALT 58 (H) 11/06/2016 1102   ALKPHOS 227 (H) 05/24/2020 0807   ALKPHOS 159 (H) 11/06/2016 1102   BILITOT 0.2 (L) 05/24/2020 0807   BILITOT 0.53 11/06/2016 1102   GFRNONAA 31 (L) 05/24/2020 0807   GFRNONAA 33 (L) 05/09/2020 1115   GFRAA 36 (L) 05/24/2020 0807   GFRAA 38 (L)  05/09/2020 1115    No results found for: SPEP, UPEP  Lab Results  Component Value Date   WBC 7.3 05/24/2020   NEUTROABS 6.2 05/24/2020   HGB 9.7 (L) 05/24/2020   HCT 29.6 (L) 05/24/2020   MCV 86.5 05/24/2020   PLT 421 (H) 05/24/2020      Chemistry      Component Value Date/Time   NA 137 05/24/2020 0807   NA 141 11/06/2016 1102   K 4.2 05/24/2020 9983  K 3.9 11/06/2016 1102   CL 105 05/24/2020 0807   CO2 20 (L) 05/24/2020 0807   CO2 27 11/06/2016 1102   BUN 30 (H) 05/24/2020 0807   BUN 18.0 11/06/2016 1102   CREATININE 1.54 (H) 05/24/2020 0807   CREATININE 1.48 (H) 05/09/2020 1115   CREATININE 1.3 (H) 11/06/2016 1102      Component Value Date/Time   CALCIUM 9.2 05/24/2020 0807   CALCIUM 10.2 11/06/2016 1102   ALKPHOS 227 (H) 05/24/2020 0807   ALKPHOS 159 (H) 11/06/2016 1102   AST 42 (H) 05/24/2020 0807   AST 52 (H) 11/06/2016 1102   ALT 19 05/24/2020 0807   ALT 58 (H) 11/06/2016 1102   BILITOT 0.2 (L) 05/24/2020 0807   BILITOT 0.53 11/06/2016 1102

## 2020-06-05 NOTE — Telephone Encounter (Signed)
Per 7/6 los, no changes made to patient schedule

## 2020-06-05 NOTE — Assessment & Plan Note (Signed)
She has bilateral lower extremity edema She will continue physical therapy for lymphedema treatment

## 2020-06-05 NOTE — Telephone Encounter (Signed)
Called and left a message. Ask her to call the office for questions. Paracentesis is scheduled Friday at 2 pm at [facility], arrive at 1:45 for the appt.

## 2020-06-05 NOTE — Assessment & Plan Note (Signed)
She continues to have significant leg swelling and recurrent malignant ascites Continue supportive care

## 2020-06-06 ENCOUNTER — Ambulatory Visit: Payer: PPO

## 2020-06-06 DIAGNOSIS — M79671 Pain in right foot: Secondary | ICD-10-CM

## 2020-06-06 DIAGNOSIS — I890 Lymphedema, not elsewhere classified: Secondary | ICD-10-CM

## 2020-06-06 DIAGNOSIS — Z9181 History of falling: Secondary | ICD-10-CM

## 2020-06-06 DIAGNOSIS — C561 Malignant neoplasm of right ovary: Secondary | ICD-10-CM | POA: Diagnosis not present

## 2020-06-06 DIAGNOSIS — R2689 Other abnormalities of gait and mobility: Secondary | ICD-10-CM

## 2020-06-06 NOTE — Patient Instructions (Signed)
Access Code: [email or web address] URL: [email or web address] Date: 06/06/2020 Prepared by: Tomma Rakers  Exercises Heel rises with counter support - 1 x daily - 7 x weekly - 1 sets - 20 reps Heel Toe Raises with Counter Support - 1 x daily - 7 x weekly - 1 sets - 20 reps Chair Pose - 1 x daily - 7 x weekly - 1 sets - 20 reps Standing Tandem Balance with Counter Support - 1 x daily - 7 x weekly - 1 sets - 2 reps - 20 seconds on one side 2x then switch feet hold Standing Single Leg Stance with Counter Support - 1 x daily - 7 x weekly - 1 sets - 5 reps - 10 seconds on one leg 5x then switch legs hold Walking Tandem Stance - 1 x daily - 7 x weekly - 1 sets - 5 reps - 5 laps at your counter stand nera something stable when doing this in case you need your hand for balance. hold Sit to Stand without Arm Support - 1 x daily - 7 x weekly - 1 sets - 10 reps

## 2020-06-06 NOTE — Therapy (Signed)
[place], [place], [address] Phone: [phone]   Fax:  [phone]  Physical Therapy Treatment  Patient Details  Name: Kayla Baker MRN: [id number] Date of Birth: [birth year] Referring Provider (PT): Heath Lark MD   Encounter Date: 06/06/2020   PT End of Session - 06/06/20 1010    Visit Number 3    Number of Visits 13    Date for PT Re-Evaluation 07/03/20    PT Start Time 1007    PT Stop Time 1100    PT Time Calculation (min) 53 min    Activity Tolerance Patient tolerated treatment well    Behavior During Therapy [facility] for tasks assessed/performed           Past Medical History:  Diagnosis Date  . Allergy   . Anemia   . Arthritis   . Blood transfusion without reported diagnosis   . Breast cancer of upper-inner quadrant of left female breast ([place]) 02/29/2016   skin- 2016- squamous- on right upper arm  . GERD (gastroesophageal reflux disease)   . Gout    takes Allopurinol daily  . History of chemotherapy   . History of colon polyps    benign  . History of shingles   . Hx of radiation therapy   . Hyperlipidemia   . Hypertension   . Ovarian ca ([place]) dx'd 03/2020  . Personal history of chemotherapy    2017  . Personal history of radiation therapy    2017  . Vitamin D deficiency     Past Surgical History:  Procedure Laterality Date  . ABDOMINAL HYSTERECTOMY  1973  . BREAST BIOPSY Left 02/26/2016  . BREAST LUMPECTOMY Left 03/18/2016  . CARDIAC CATHETERIZATION     patient denies this procedure  . CATARACT EXTRACTION, BILATERAL  2014   right eye 2/14; left eye 3/3  . COLONOSCOPY    . HAND SURGERY Left 2012  . INCONTINENCE SURGERY  2006  . IR IMAGING GUIDED PORT INSERTION  04/24/2020  . IR PARACENTESIS  05/08/2020  . IR PARACENTESIS  05/16/2020  . IR PARACENTESIS  05/25/2020  . KNEE ARTHROSCOPY Right   . MASTOPEXY Right 06/16/2017   Procedure: RIGHT BREAST MASTOPEXY;  Surgeon: Irene Limbo, MD;  Location: [facility];  Service: Plastics;  Laterality: Right;  . PORT-A-CATH REMOVAL N/A 11/27/2016   Procedure: REMOVAL PORT-A-CATH;  Surgeon: Fanny Skates, MD;  Location: [facility];  Service: General;  Laterality: N/A;  . PORTACATH PLACEMENT N/A 04/14/2016   Procedure: INSERTION PORT-A-CATH ;  Surgeon: Fanny Skates, MD;  Location: [place];  Service: General;  Laterality: N/A;  . RADIOACTIVE SEED GUIDED PARTIAL MASTECTOMY WITH AXILLARY SENTINEL LYMPH NODE BIOPSY Left 03/18/2016   Procedure: RADIOACTIVE SEED GUIDED LEFT PARTIAL MASTECTOMY WITH AXILLARY SENTINEL LYMPH NODE BIOPSY AND BLUE DYE INJECTION;  Surgeon: Fanny Skates, MD;  Location: [place];  Service: General;  Laterality: Left;  . REDUCTION MAMMAPLASTY Right 2018    There were no vitals filed for this visit.   Subjective Assessment - 06/06/20 1010    Subjective Pt states that her and her spouse have wrapped her leg and feel confident doing it. Her sister helped her wrap one time as well. Today when she took the wrap off of her R leg she had redness in her R anterior lower leg so she did not re-wrap it. The redness has stayed in her L dorsum of her foot so her MD told her to stop taking  the antibiotic (discussed that the swelling may be causing some of the redness.    Pertinent History Redemonstrated dominant mixed solid and cystic mass arising from the vicinity of the right ovary measuring at least 11.5 x 10.7 cm malignant orvarian mass, CKD stage III, possible chronic cirrhosis, history of L breast cancer with lumpectomy, chemotherapy and radiation.    Patient Stated Goals I want my legs to be normal.    Currently in Pain? No/denies    Pain Score 0-No pain                 LYMPHEDEMA/ONCOLOGY QUESTIONNAIRE - 06/06/20 0001      Right Lower Extremity Lymphedema   20 cm Proximal to Suprapatella 47 cm    10 cm Proximal to Suprapatella 42.2 cm    At Midpatella/Popliteal Crease 40.2 cm    30 cm Proximal  to Floor at Lateral Plantar Foot 34 cm    20 cm Proximal to Floor at Lateral Plantar Foot 27.7 1    10  cm Proximal to Floor at Lateral Malleoli 24.2 cm    5 cm Proximal to 1st MTP Joint 21.4 cm    Across MTP Joint 22.4 cm    Around Proximal Great Toe 7.9 cm      Left Lower Extremity Lymphedema   20 cm Proximal to Suprapatella 44.4 cm    10 cm Proximal to Suprapatella 38.2 cm    At Midpatella/Popliteal Crease 35.4 cm    30 cm Proximal to Floor at Lateral Plantar Foot 32.4 cm    20 cm Proximal to Floor at Lateral Plantar Foot 24.5 cm    10 cm Proximal to Floor at Lateral Malleoli 23 cm    5 cm Proximal to 1st MTP Joint 21.9 cm    Across MTP Joint 22.3 cm    Around Proximal Great Toe 7.8 cm                [facility]  Lymphedema Life Impact Scale Total Score 52.94 %            [facility] Adult PT Treatment/Exercise - 06/06/20 0001      Neuro Re-ed    Neuro Re-ed Details  heel raises, standing DF and ankle pendulums for reticular activation, tandem stance 2x 20 seconds bil feet, SLS 5x 10 seconds R/L, tandem gait at raised plinth table 5 laps intermittent UE support that improved with repetitions to no Ue support except with SLS on the LLE. Sit to stand 10x w/o UE support.       Manual Therapy   Manual Therapy Edema management    Edema Management circumferential measurements.                   PT Education - 06/06/20 1100    Education Details Access Code: [email or web address], Pt will continue with MLD and compression wrap at home. She will try wrapping her L lower leg and if it is painful will notify MD. She will begin balance activities.    Person(s) Educated Patient    Methods Explanation;Demonstration;Verbal cues;Handout    Comprehension Verbalized understanding;Returned demonstration            PT Short Term Goals - 05/29/20 1504      PT SHORT TERM GOAL #1   Title Pt will be independent with self  MLD and LE wrapping in order to demonstrate autonomy of care.    Baseline pt currently does not know how  to wrap or perform MLD    Time 4    Period Weeks    Status New    Target Date 07/03/20             PT Long Term Goals - 05/29/20 1731      PT LONG TERM GOAL #1   Title Pt will demonstrate 1 cm reduction in the L ankle and 3 cm reduction in the R ankle, calf and thigh within 6 weeks to demonstrate decreased fluid.    Baseline see measurements    Time 4    Period Weeks    Status New    Target Date 07/03/20      PT LONG TERM GOAL #2   Title Pt will be measured for and fitted with appropriate compression garments to wear on a daily basis for home management of lymphedema.    Baseline currently pt has no compression    Time 4    Period Weeks    Status New    Target Date 07/03/20      PT LONG TERM GOAL #3   Title Pt will perform 9 sit to stands in 30 seconds and perform TUG at 13.5 seconds or less in order to demonstrate decreased risk for falls.    Baseline 7 sit to stands in 30 seconds and 14.39 second TUG score.    Time 4    Period Weeks    Status New    Target Date 07/03/20      PT LONG TERM GOAL #4   Title Pt will report 50% improvement in pain/function within 4 weeks in order to demonstrate a subjective improvement in quality of life.    Baseline 7/10 pain    Time 4    Period Weeks    Status New    Target Date 07/03/20                 Plan - 06/06/20 1010    Clinical Impression Statement Pt continues with edema in her RLE and L lower leg with continuing improvement in circumferential measurements. She has stopped her antibiotic due to coloring was not improving and her MD told her to stop it. Discussed going ahead and wrapping the L lower leg and continueing with wrapping the R lower leg due to discolortation may be due to fluid; if she notices any significant discomfort she will remove wraps and will contact MD. Will continue to not wrap R thigh due to  measurements continue to go down and pt is concerned about mobility. Balance activities were initiated this session with good improvement in proprioception as demonstrated by decreased need for UE support with repetitions. Pt will benefit from continued POC at this time.    Personal Factors and Comorbidities Comorbidity 3+    Comorbidities Ovarion tumor, possible cirrhosis, CKD stage III, History of L breast cancer with radiation.    Rehab Potential Good    PT Frequency 3x / week    PT Duration 4 weeks    PT Treatment/Interventions Therapeutic activities;Therapeutic exercise;Balance training;Neuromuscular re-education;Manual techniques    PT Next Visit Plan Teach wrapping just to knee initially working toward groin slowly and self MLD for the LE avoid the abdomen just perform MLD at the LE, avoid compression on abdomen due to ascites. Balance, endurance and LE strengthening as tolerated/needed.    PT Home Exercise Plan Access Code: [email or web address]    Consulted and Agree with Plan of Care Patient;Family member/caregiver  Patient will benefit from skilled therapeutic intervention in order to improve the following deficits and impairments:  Decreased strength, Pain, Increased edema, Decreased balance  Visit Diagnosis: Malignant neoplasm of right ovary ([facility])  Balance disorder  Risk for falls  Pain in both feet  Lymphedema, not elsewhere classified     Problem List Patient Active Problem List   Diagnosis Date Noted  . Protein-calorie malnutrition, moderate ([place]) 06/05/2020  . Pancytopenia, acquired ([place]) 05/15/2020  . Cancer associated pain 05/15/2020  . Malignant ascites 05/07/2020  . Anemia in neoplastic disease 05/03/2020  . Goals of care, counseling/discussion 04/20/2020  . Ovarian cancer ([place]) 04/19/2020  . Carcinomatosis ([place]) 04/17/2020  . Leg edema, right 04/05/2020  . Incontinence of urine in female 04/04/2020  . CKD stage 3 due to type 2 diabetes mellitus ([place])  07/05/2018  . Port catheter in place 09/18/2016  . Chemotherapy-induced peripheral neuropathy ([place]) 08/07/2016  . Breast cancer of upper-inner quadrant of left female breast ([place]) 02/29/2016  . Gout 12/08/2014  . Diabetic neuropathy, painful ([place]) 08/28/2014  . Medication management 08/27/2014  . Essential hypertension 11/10/2013  . Hyperlipidemia associated with type 2 diabetes mellitus ([place]) 11/10/2013  . T2_NIDDM w/CKD3 ([place]) 11/10/2013  . Vitamin D deficiency 11/10/2013    Ander Purpura, PT 06/06/2020, 11:07 AM  [place], [place], [address] Phone: [phone]   Fax:  [phone]  Name: PALMA BUSTER MRN: [id number] Date of Birth: December 15, 1935

## 2020-06-08 ENCOUNTER — Ambulatory Visit (HOSPITAL_COMMUNITY)
Admission: RE | Admit: 2020-06-08 | Discharge: 2020-06-08 | Disposition: A | Discharge status: Home / Self Care | Payer: PPO | Source: Ambulatory Visit | Attending: Hematology and Oncology | Admitting: Hematology and Oncology

## 2020-06-08 DIAGNOSIS — R180 Malignant ascites: Secondary | ICD-10-CM

## 2020-06-08 DIAGNOSIS — C561 Malignant neoplasm of right ovary: Secondary | ICD-10-CM

## 2020-06-08 MED ORDER — LIDOCAINE HCL 1 % IJ SOLN
INTRAMUSCULAR | Status: AC
  Filled 2020-06-08: qty 20

## 2020-06-12 ENCOUNTER — Other Ambulatory Visit (HOSPITAL_COMMUNITY): Payer: PPO

## 2020-06-14 ENCOUNTER — Encounter: Payer: Self-pay | Admitting: Hematology and Oncology

## 2020-06-14 ENCOUNTER — Inpatient Hospital Stay: Payer: PPO

## 2020-06-14 ENCOUNTER — Inpatient Hospital Stay: Payer: PPO | Admitting: Hematology and Oncology

## 2020-06-14 ENCOUNTER — Other Ambulatory Visit: Payer: Self-pay

## 2020-06-14 ENCOUNTER — Other Ambulatory Visit: Payer: Self-pay | Admitting: Hematology and Oncology

## 2020-06-14 VITALS — BP 128/66 | HR 70 | Temp 97.9°F | Resp 18

## 2020-06-14 VITALS — BP 126/58 | HR 87 | Temp 97.8°F | Resp 18 | Ht 64.0 in | Wt 142.4 lb

## 2020-06-14 DIAGNOSIS — C561 Malignant neoplasm of right ovary: Secondary | ICD-10-CM

## 2020-06-14 DIAGNOSIS — D61818 Other pancytopenia: Secondary | ICD-10-CM

## 2020-06-14 DIAGNOSIS — R600 Localized edema: Secondary | ICD-10-CM | POA: Diagnosis not present

## 2020-06-14 DIAGNOSIS — C50212 Malignant neoplasm of upper-inner quadrant of left female breast: Secondary | ICD-10-CM | POA: Diagnosis not present

## 2020-06-14 DIAGNOSIS — E1122 Type 2 diabetes mellitus with diabetic chronic kidney disease: Secondary | ICD-10-CM

## 2020-06-14 DIAGNOSIS — D630 Anemia in neoplastic disease: Secondary | ICD-10-CM | POA: Diagnosis not present

## 2020-06-14 DIAGNOSIS — Z95828 Presence of other vascular implants and grafts: Secondary | ICD-10-CM

## 2020-06-14 DIAGNOSIS — R180 Malignant ascites: Secondary | ICD-10-CM | POA: Diagnosis not present

## 2020-06-14 DIAGNOSIS — C569 Malignant neoplasm of unspecified ovary: Secondary | ICD-10-CM

## 2020-06-14 DIAGNOSIS — Z170 Estrogen receptor positive status [ER+]: Secondary | ICD-10-CM

## 2020-06-14 DIAGNOSIS — N183 Chronic kidney disease, stage 3 unspecified: Secondary | ICD-10-CM | POA: Diagnosis not present

## 2020-06-14 DIAGNOSIS — G620 Drug-induced polyneuropathy: Secondary | ICD-10-CM | POA: Diagnosis not present

## 2020-06-14 DIAGNOSIS — Z5111 Encounter for antineoplastic chemotherapy: Secondary | ICD-10-CM | POA: Diagnosis not present

## 2020-06-14 DIAGNOSIS — T451X5A Adverse effect of antineoplastic and immunosuppressive drugs, initial encounter: Secondary | ICD-10-CM

## 2020-06-14 LAB — CBC WITH DIFFERENTIAL (CANCER CENTER ONLY)
Abs Immature Granulocytes: 0.05 10*3/uL (ref 0.00–0.07)
Basophils Absolute: 0 10*3/uL (ref 0.0–0.1)
Basophils Relative: 0 %
Eosinophils Absolute: 0 10*3/uL (ref 0.0–0.5)
Eosinophils Relative: 0 %
HCT: 23.3 % — ABNORMAL LOW (ref 36.0–46.0)
Hemoglobin: 7.6 g/dL — ABNORMAL LOW (ref 12.0–15.0)
Immature Granulocytes: 1 %
Lymphocytes Relative: 12 %
Lymphs Abs: 0.6 10*3/uL — ABNORMAL LOW (ref 0.7–4.0)
MCH: 28.6 pg (ref 26.0–34.0)
MCHC: 32.6 g/dL (ref 30.0–36.0)
MCV: 87.6 fL (ref 80.0–100.0)
Monocytes Absolute: 0.1 10*3/uL (ref 0.1–1.0)
Monocytes Relative: 2 %
Neutro Abs: 4.3 10*3/uL (ref 1.7–7.7)
Neutrophils Relative %: 85 %
Platelet Count: 126 10*3/uL — ABNORMAL LOW (ref 150–400)
RBC: 2.66 MIL/uL — ABNORMAL LOW (ref 3.87–5.11)
RDW: 18.2 % — ABNORMAL HIGH (ref 11.5–15.5)
WBC Count: 5 10*3/uL (ref 4.0–10.5)
nRBC: 0 % (ref 0.0–0.2)

## 2020-06-14 LAB — ABO/RH: ABO/RH(D): A POS

## 2020-06-14 LAB — CMP (CANCER CENTER ONLY)
ALT: 28 U/L (ref 0–44)
AST: 64 U/L — ABNORMAL HIGH (ref 15–41)
Albumin: 2.8 g/dL — ABNORMAL LOW (ref 3.5–5.0)
Alkaline Phosphatase: 218 U/L — ABNORMAL HIGH (ref 38–126)
Anion gap: 11 (ref 5–15)
BUN: 20 mg/dL (ref 8–23)
CO2: 23 mmol/L (ref 22–32)
Calcium: 9.2 mg/dL (ref 8.9–10.3)
Chloride: 109 mmol/L (ref 98–111)
Creatinine: 1.14 mg/dL — ABNORMAL HIGH (ref 0.44–1.00)
GFR, Est AFR Am: 52 mL/min — ABNORMAL LOW (ref >60)
GFR, Estimated: 45 mL/min — ABNORMAL LOW (ref >60)
Glucose, Bld: 190 mg/dL — ABNORMAL HIGH (ref 70–99)
Potassium: 3.9 mmol/L (ref 3.5–5.1)
Sodium: 143 mmol/L (ref 135–145)
Total Bilirubin: 0.4 mg/dL (ref 0.3–1.2)
Total Protein: 6.1 g/dL — ABNORMAL LOW (ref 6.5–8.1)

## 2020-06-14 LAB — PREPARE RBC (CROSSMATCH)

## 2020-06-14 MED ORDER — SODIUM CHLORIDE 0.9 % IV SOLN
Freq: Once | INTRAVENOUS | Status: AC
  Filled 2020-06-14: qty 250

## 2020-06-14 MED ORDER — ACETAMINOPHEN 325 MG PO TABS
ORAL_TABLET | ORAL | Status: AC
  Filled 2020-06-14: qty 2

## 2020-06-14 MED ORDER — SODIUM CHLORIDE 0.9% FLUSH
10.0000 mL | Freq: Once | INTRAVENOUS | Status: AC
  Administered 2020-06-14: 10 mL
  Filled 2020-06-14: qty 10

## 2020-06-14 MED ORDER — PALONOSETRON HCL INJECTION 0.25 MG/5ML
0.2500 mg | Freq: Once | INTRAVENOUS | Status: AC
  Administered 2020-06-14: 0.25 mg via INTRAVENOUS

## 2020-06-14 MED ORDER — ACETAMINOPHEN 325 MG PO TABS
650.0000 mg | ORAL_TABLET | Freq: Once | ORAL | Status: AC
  Administered 2020-06-14: 650 mg via ORAL

## 2020-06-14 MED ORDER — SODIUM CHLORIDE 0.9 % IV SOLN
150.0000 mg | Freq: Once | INTRAVENOUS | Status: AC
  Administered 2020-06-14: 150 mg via INTRAVENOUS
  Filled 2020-06-14: qty 150

## 2020-06-14 MED ORDER — DIPHENHYDRAMINE HCL 50 MG/ML IJ SOLN
50.0000 mg | Freq: Once | INTRAMUSCULAR | Status: AC
  Administered 2020-06-14: 50 mg via INTRAVENOUS

## 2020-06-14 MED ORDER — SODIUM CHLORIDE 0.9% IV SOLUTION
250.0000 mL | Freq: Once | INTRAVENOUS | Status: AC
  Administered 2020-06-14: 250 mL via INTRAVENOUS
  Filled 2020-06-14: qty 250

## 2020-06-14 MED ORDER — SODIUM CHLORIDE 0.9% FLUSH
10.0000 mL | INTRAVENOUS | Status: DC | PRN
  Administered 2020-06-14: 10 mL
  Filled 2020-06-14: qty 10

## 2020-06-14 MED ORDER — SODIUM CHLORIDE 0.9 % IV SOLN
330.0000 mg | Freq: Once | INTRAVENOUS | Status: AC
  Administered 2020-06-14: 330 mg via INTRAVENOUS
  Filled 2020-06-14: qty 33

## 2020-06-14 MED ORDER — PALONOSETRON HCL INJECTION 0.25 MG/5ML
INTRAVENOUS | Status: AC
  Filled 2020-06-14: qty 5

## 2020-06-14 MED ORDER — SODIUM CHLORIDE 0.9 % IV SOLN
131.2500 mg/m2 | Freq: Once | INTRAVENOUS | Status: AC
  Administered 2020-06-14: 228 mg via INTRAVENOUS
  Filled 2020-06-14: qty 38

## 2020-06-14 MED ORDER — DIPHENHYDRAMINE HCL 25 MG PO CAPS: 25.0000 mg | ORAL_CAPSULE | Freq: Once | ORAL | Status: DC

## 2020-06-14 MED ORDER — FAMOTIDINE IN NACL 20-0.9 MG/50ML-% IV SOLN
20.0000 mg | Freq: Once | INTRAVENOUS | Status: AC
  Administered 2020-06-14: 20 mg via INTRAVENOUS

## 2020-06-14 MED ORDER — HEPARIN SOD (PORK) LOCK FLUSH 100 UNIT/ML IV SOLN
500.0000 [IU] | Freq: Once | INTRAVENOUS | Status: AC | PRN
  Administered 2020-06-14: 500 [IU]
  Filled 2020-06-14: qty 5

## 2020-06-14 MED ORDER — DIPHENHYDRAMINE HCL 50 MG/ML IJ SOLN
INTRAMUSCULAR | Status: AC
  Filled 2020-06-14: qty 1

## 2020-06-14 MED ORDER — SODIUM CHLORIDE 0.9 % IV SOLN
10.0000 mg | Freq: Once | INTRAVENOUS | Status: AC
  Administered 2020-06-14: 10 mg via INTRAVENOUS
  Filled 2020-06-14: qty 10

## 2020-06-14 MED ORDER — FAMOTIDINE IN NACL 20-0.9 MG/50ML-% IV SOLN
INTRAVENOUS | Status: AC
  Filled 2020-06-14: qty 50

## 2020-06-14 NOTE — Assessment & Plan Note (Signed)
We discussed some of the risks, benefits, and alternatives of blood transfusions. The patient is symptomatic from anemia and the hemoglobin level is critically low.  Some of the side-effects to be expected including risks of transfusion reactions, chills, infection, syndrome of volume overload and risk of hospitalization from various reasons and the patient is willing to proceed and went ahead to sign consent today. She will proceed with 1 unit of blood today

## 2020-06-14 NOTE — Assessment & Plan Note (Signed)
We discussed the risk and benefits of paracentesis She is instructed to call if she felt bloated She still have detectable ascites but she does not need paracentesis this week We will order CT imaging for further assessment

## 2020-06-14 NOTE — Assessment & Plan Note (Signed)
She has intermittent wide fluctuation of her serum creatinine I have informed the pharmacist not to escalate the dose of carboplatin

## 2020-06-14 NOTE — Progress Notes (Signed)
[facility] OFFICE PROGRESS NOTE  Patient Care Team: [name], MD as PCP - Kinnie Scales, OD as Referring Physician (Optometry) [name], MD as Consulting Physician (Dermatology) Latanya Maudlin, MD as Consulting Physician (Orthopedic Surgery) Inda Castle, MD (Inactive) as Consulting Physician (Gastroenterology) Jacqulyn Liner, RN as Oncology Nurse Navigator (Oncology) Heath Lark, MD as Consulting Physician (Hematology and Oncology)  ASSESSMENT & PLAN:  Ovarian cancer [facility]) She continues to have significant leg swelling and persistent malignant ascites She has developed significant pancytopenia We will proceed with treatment today along with blood transfusion support I plan to order CT imaging next week for further follow-up  Pancytopenia, acquired [facility]) We discussed some of the risks, benefits, and alternatives of blood transfusions. The patient is symptomatic from anemia and the hemoglobin level is critically low.  Some of the side-effects to be expected including risks of transfusion reactions, chills, infection, syndrome of volume overload and risk of hospitalization from various reasons and the patient is willing to proceed and went ahead to sign consent today. She will proceed with 1 unit of blood today  CKD stage 3 due to type 2 diabetes mellitus ([place]) She has intermittent wide fluctuation of her serum creatinine I have informed the pharmacist not to escalate the dose of carboplatin  Chemotherapy-induced peripheral neuropathy (HCC) Her neuropathy is stable We will continue reduced dose Taxol  Malignant ascites We discussed the risk and benefits of paracentesis She is instructed to call if she felt bloated She still have detectable ascites but she does not need paracentesis this week We will order CT imaging for further assessment  Leg edema, right She has bilateral lower extremity edema She will continue physical therapy for lymphedema  treatment   Orders Placed This Encounter  Procedures  . CT ABDOMEN PELVIS W CONTRAST    Standing Status:   Future    Standing Expiration Date:   06/14/2021    Order Specific Question:   If indicated for the ordered procedure, I authorize the administration of contrast media per Radiology protocol    Answer:   Yes    Order Specific Question:   Preferred imaging location?    Answer:   [facility]    Order Specific Question:   Radiology Contrast Protocol - do NOT remove file path    Answer:   [email or web address]  . Informed Consent Details: Physician/Practitioner Attestation; Transcribe to consent form and obtain patient signature    Standing Status:   Future    Standing Expiration Date:   06/14/2021    Order Specific Question:   Physician/Practitioner attestation of informed consent for blood and or blood product transfusion    Answer:   I, the physician/practitioner, attest that I have discussed with the patient the benefits, risks, side effects, alternatives, likelihood of achieving goals and potential problems during recovery for the procedure that I have provided informed consent.    Order Specific Question:   Product(s)    Answer:   All Product(s)  . Care order/instruction    Transfuse Parameters    Standing Status:   Future    Standing Expiration Date:   06/14/2021  . Sample to Blood Bank    Standing Status:   Standing    Number of Occurrences:   33    Standing Expiration Date:   06/14/2021  . Type and screen    Standing Status:   Future    Number of Occurrences:   1    Standing Expiration  Date:   06/14/2021    All questions were answered. The patient knows to call the clinic with any problems, questions or concerns. The total time spent in the appointment was 40 minutes encounter with patients including review of chart and various tests results, discussions about plan of care and coordination of care plan   Heath Lark, MD 06/14/2020 10:38  AM  INTERVAL HISTORY: Please see below for problem oriented charting. She is seen prior to cycle 3 of chemotherapy Denies worsening peripheral neuropathy No nausea Her bowel habits are regular Denies significant abdominal pain She did not undergo paracentesis last week No recent infection, fever or chills The patient denies any recent signs or symptoms of bleeding such as spontaneous epistaxis, hematuria or hematochezia. She still have persistent bilateral lower extremity edema  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[place])  02/26/2016 Initial Diagnosis   Left breast biopsy 11:00 position: invasive ductal carcinoma with DCIS, ER 90%, PR 10%, HER-2 negative, Ki-67 30%, grade 2, 2.2 cm palpable lesion T2 N0 stage II a clinical stage   03/18/2016 Surgery   Left lumpectomy: Invasive ductal carcinoma, grade 2, 6.3 cm, with high-grade DCIS, margins negative, 0/4 lymph nodes negative, ER 90%, via 10%, HER-2 negative ratio 0.97, Ki-67 30%, T3 N0 stage IIB   03/25/2016 Procedure   Genetic testing is negative for pathogenic mutations within any of the 20 Genes on the breast/ovarian cancer panel   04/04/2016 Oncotype testing   Oncotype DX recurrence score 37, 25% 10 year distant risk of recurrence   04/17/2016 - 07/31/2016 Chemotherapy   Adjuvant chemotherapy with dose dense Adriamycin and Cytoxan followed by Abraxane weekly 8 ( discontinued due to neuropathy)   09/01/2016 - 09/26/2016 Radiation Therapy   Adj XRT 1) Left breast: 42.5 Gy in 17 fractions. 2) Left breast boost: 7.5 Gy in 3 fractions.   12/02/2016 -  Anti-estrogen oral therapy   Anastrozole 1 mg daily   Ovarian cancer ([place])  04/03/2020 Imaging   US pelvis Complex cystic and solid mass in LEFT adnexa 12.5 cm diameter question cystic ovarian neoplasm; recommend correlation with serum tumor markers and further evaluation by MR imaging with and without contrast.      04/04/2020 Imaging   US venous Doppler No evidence of deep venous thrombosis in the right lower extremity. Left common femoral vein also patent.   04/15/2020 Imaging   MRI pelvis 1. Large complex solid and cystic mass arising from the right adnexa measuring 10.8 by 11.5 by 11.1 cm. This has an aggressive appearance with extensive enhancing mural soft tissue components. Findings are highly suspicious for malignant ovarian neoplasm. 2. Extensive bilateral retroperitoneal and bilateral iliac adenopathy compatible with metastatic disease. 3. Signs of extensive peritoneal carcinomatosis including ascites, enhancement, thickening and nodularity of the peritoneal reflections, omental caking and bulky peritoneal nodularity. 4. Suspected serosal involvement of the dome of bladder with loss of normal fat plane. Cannot rule out mural invasion by tumor.   04/17/2020 Tumor Marker   Patient's tumor was tested for the following markers: CA-125 Results of the tumor marker test revealed 1762.   04/20/2020 Cancer Staging   Staging form: Ovary, Fallopian Tube, and Primary Peritoneal Carcinoma, AJCC 8th Edition - Clinical: FIGO Stage IIIC (cT3, cN1, cM0) - Signed by Heath Lark, MD on 04/20/2020   04/23/2020 Imaging   1. Redemonstrated dominant mixed solid and cystic mass arising from the vicinity of the right ovary measuring at least 11.5  x 10.7 cm, not significantly changed compared to prior MR, and consistent with primary ovarian malignancy.  2. Numerous bulky retroperitoneal, bilateral iliac, and pelvic sidewall lymph nodes. 3. Moderate volume ascites throughout the abdomen and pelvis with subtle thickening and nodularity throughout the peritoneum, and extensive bulky nodular metastatic disease of the omentum. 4. Constellation of findings is consistent with advanced nodal and peritoneal metastatic disease. 5. There are prominent subcentimeter epicardial lymph nodes, nonspecific although suspicious for nodal  metastatic disease. No definite nodal metastatic disease in the chest. Attention on follow-up. 6. There are multiple small subpleural nodules at the right lung base overlying the diaphragm, measuring up to 7 mm. These are generally nonspecific and less favored to represent pulmonary metastatic disease given distribution. Attention on follow-up. 7. Somewhat coarse contour of the liver, suggestive of cirrhosis, although out without overt morphologic stigmata. 8. Aortic Atherosclerosis (ICD10-I70.0).     04/24/2020 Procedure   Successful placement of a right internal jugular approach power injectable Port-A-Cath. The catheter is ready for immediate use.     05/02/2020 Tumor Marker   Patient's tumor was tested for the following markers: CA-125 Results of the tumor marker test revealed [id number]   05/03/2020 -  Chemotherapy   The patient had carboplatin and taxol for chemotherapy treatment.     05/08/2020 Procedure   Successful ultrasound-guided paracentesis yielding 2.6 liters of peritoneal fluid.   05/16/2020 Procedure   Successful ultrasound-guided paracentesis yielding 3.2 liters of peritoneal fluid   05/25/2020 Procedure   Successful ultrasound-guided paracentesis yielding 3 liters of peritoneal fluid.     06/01/2020 Procedure   Successful ultrasound-guided therapeutic paracentesis yielding 1.7 liters of peritoneal fluid.     REVIEW OF SYSTEMS:   Constitutional: Denies fevers, chills or abnormal weight loss Eyes: Denies blurriness of vision Ears, nose, mouth, throat, and face: Denies mucositis or sore throat Respiratory: Denies cough, dyspnea or wheezes Cardiovascular: Denies palpitation, chest discomfort  Gastrointestinal:  Denies nausea, heartburn or change in bowel habits Skin: Denies abnormal skin rashes Lymphatics: Denies new lymphadenopathy or easy bruising Behavioral/Psych: Mood is stable, no new changes  All other systems were reviewed with the patient and are negative.  I have  reviewed the past medical history, past surgical history, social history and family history with the patient and they are unchanged from previous note.  ALLERGIES:  is allergic to keflex [cephalexin], meloxicam, prednisone, premarin [estrogens conjugated], and trovan [alatrofloxacin].  MEDICATIONS:  Current Outpatient Medications  Medication Sig Dispense Refill  . acetaminophen (TYLENOL) 325 MG tablet Take 325 mg by mouth every 6 (six) hours as needed for headache.    . allopurinol (ZYLOPRIM) 300 MG tablet Take 1/2 tablet Daily to Prevent Gout 90 tablet 3  . blood glucose meter kit and supplies KIT Dispense based on patient and insurance preference. Use to check blood sugar once daily. Dx: E11.22, N18.30 1 each 12  . Cholecalciferol (VITAMIN D3) 5000 units CAPS Take 5,000 Units by mouth daily.    Marland Kitchen dexamethasone (DECADRON) 4 MG tablet Take 2 tabs at the night before and 2 tab the morning of chemotherapy, every 3 weeks, by mouth 36 tablet 5  . doxycycline (VIBRA-TABS) 100 MG tablet Take 1 tablet (100 mg total) by mouth 2 (two) times daily. 14 tablet 0  . gabapentin (NEURONTIN) 300 MG capsule Take 1 capsule 3 to 4  x /day as needed for pain (Patient taking differently: Take 300 mg by mouth See admin instructions. Take 300 mg 3 times daily may take a  4th 300 mg dose as needed for pain) 360 capsule 3  . glimepiride (AMARYL) 1 MG tablet Take 1 tab by mouth in the morning if fasting is running 150+. Please do not take this medication if you are ill or not eating as this can cause a low blood sugar. 90 tablet 1  . glucose blood (FREESTYLE TEST STRIPS) test strip Check Blood Sugar Daily as directed (Dx: E11.29) 100 strip 3  . HYDROcodone-acetaminophen (NORCO/VICODIN) 5-325 MG tablet Take 1 tablet by mouth every 6 (six) hours as needed for moderate pain. 30 tablet 0  . Lancets (FREESTYLE) lancets CHECK BLOOD SUGAR DAILY AS DIRECTED 100 each 3  . lidocaine-prilocaine (EMLA) cream Apply to affected area once  (Patient taking differently: Apply 1 application topically daily as needed (port access). ) 30 g 3  . loratadine (CLARITIN) 10 MG tablet Take 10 mg by mouth daily as needed for allergies.    . Magnesium ([employer] MAGNESIUM) 250 MG TABS Take 250 mg by mouth 2 (two) times daily.    . metoprolol succinate (TOPROL-XL) 25 MG 24 hr tablet Take 1 tablet Daily for BP (Patient taking differently: Take 25 mg by mouth daily. ) 90 tablet 3  . mirabegron ER (MYRBETRIQ) 50 MG TB24 tablet Take 1 tablet (50 mg total) by mouth daily. 30 tablet 2  . ondansetron (ZOFRAN) 8 MG tablet Take 1 tablet (8 mg total) by mouth 2 (two) times daily as needed for refractory nausea / vomiting. Start on day 3 after carboplatin chemo. (Patient not taking: Reported on 05/09/2020) 30 tablet 1  . prochlorperazine (COMPAZINE) 10 MG tablet Take 1 tablet (10 mg total) by mouth every 6 (six) hours as needed for nausea or vomiting. (Patient not taking: Reported on 05/09/2020) 60 tablet 9  . vitamin C (ASCORBIC ACID) 500 MG tablet Take 500 mg by mouth every evening.      No current facility-administered medications for this visit.   Facility-Administered Medications Ordered in Other Visits  Medication Dose Route Frequency Provider Last Rate Last Admin  . 0.9 %  sodium chloride infusion (Manually program via Guardrails IV Fluids)  250 mL Intravenous Once [name], Josey Dettmann, MD      . acetaminophen (TYLENOL) tablet 650 mg  650 mg Oral Once [name], Adger Cantera, MD      . CARBOplatin (PARAPLATIN) 330 mg in sodium chloride 0.9 % 250 mL chemo infusion  330 mg Intravenous Once [name], Alexus Michael, MD      . diphenhydrAMINE (BENADRYL) capsule 25 mg  25 mg Oral Once [name], Chanon Loney, MD      . heparin lock flush 100 unit/mL  500 Units Intracatheter Once PRN [name], Kimmarie Pascale, MD      . PACLitaxel (TAXOL) 228 mg in sodium chloride 0.9 % 250 mL chemo infusion (> 51m/m2)  131.25 mg/m2 (Treatment Plan Recorded) Intravenous Once Brand Siever, MD      . sodium chloride flush (NS) 0.9 % injection  10 mL  10 mL Intracatheter PRN GAlvy Bimler Loren Vicens, MD        PHYSICAL EXAMINATION: ECOG PERFORMANCE STATUS: 2 - Symptomatic, <50% confined to bed  Vitals:   06/14/20 0821  BP: (!) 126/58  Pulse: 87  Resp: 18  Temp: 97.8 F (36.6 C)  SpO2: 100%   Filed Weights   06/14/20 0821  Weight: 142 lb 6.4 oz (64.6 kg)    GENERAL:alert, no distress and comfortable.  She looks pale SKIN: skin color, texture, turgor are normal, no rashes or significant lesions EYES: normal, Conjunctiva  are pink and non-injected, sclera clear OROPHARYNX:no exudate, no erythema and lips, buccal mucosa, and tongue normal  NECK: supple, thyroid normal size, non-tender, without nodularity LYMPH:  no palpable lymphadenopathy in the cervical, axillary or inguinal LUNGS: clear to auscultation and percussion with normal breathing effort HEART: regular rate & rhythm and no murmurs with bilateral lower extremity edema, right greater than left ABDOMEN:abdomen soft, distended with some ascites Musculoskeletal:no cyanosis of digits and no clubbing  NEURO: alert & oriented x 3 with fluent speech, no focal motor/sensory deficits  LABORATORY DATA:  I have reviewed the data as listed    Component Value Date/Time   NA 143 06/14/2020 0810   NA 141 11/06/2016 1102   K 3.9 06/14/2020 0810   K 3.9 11/06/2016 1102   CL 109 06/14/2020 0810   CO2 23 06/14/2020 0810   CO2 27 11/06/2016 1102   GLUCOSE 190 (H) 06/14/2020 0810   GLUCOSE 130 11/06/2016 1102   BUN 20 06/14/2020 0810   BUN 18.0 11/06/2016 1102   CREATININE 1.14 (H) 06/14/2020 0810   CREATININE 1.48 (H) 05/09/2020 1115   CREATININE 1.3 (H) 11/06/2016 1102   CALCIUM 9.2 06/14/2020 0810   CALCIUM 10.2 11/06/2016 1102   PROT 6.1 (L) 06/14/2020 0810   PROT 7.1 11/06/2016 1102   ALBUMIN 2.8 (L) 06/14/2020 0810   ALBUMIN 3.7 11/06/2016 1102   AST 64 (H) 06/14/2020 0810   AST 52 (H) 11/06/2016 1102   ALT 28 06/14/2020 0810   ALT 58 (H) 11/06/2016 1102   ALKPHOS 218  (H) 06/14/2020 0810   ALKPHOS 159 (H) 11/06/2016 1102   BILITOT 0.4 06/14/2020 0810   BILITOT 0.53 11/06/2016 1102   GFRNONAA 45 (L) 06/14/2020 0810   GFRNONAA 33 (L) 05/09/2020 1115   GFRAA 52 (L) 06/14/2020 0810   GFRAA 38 (L) 05/09/2020 1115    No results found for: SPEP, UPEP  Lab Results  Component Value Date   WBC 5.0 06/14/2020   NEUTROABS 4.3 06/14/2020   HGB 7.6 (L) 06/14/2020   HCT 23.3 (L) 06/14/2020   MCV 87.6 06/14/2020   PLT 126 (L) 06/14/2020      Chemistry      Component Value Date/Time   NA 143 06/14/2020 0810   NA 141 11/06/2016 1102   K 3.9 06/14/2020 0810   K 3.9 11/06/2016 1102   CL 109 06/14/2020 0810   CO2 23 06/14/2020 0810   CO2 27 11/06/2016 1102   BUN 20 06/14/2020 0810   BUN 18.0 11/06/2016 1102   CREATININE 1.14 (H) 06/14/2020 0810   CREATININE 1.48 (H) 05/09/2020 1115   CREATININE 1.3 (H) 11/06/2016 1102      Component Value Date/Time   CALCIUM 9.2 06/14/2020 0810   CALCIUM 10.2 11/06/2016 1102   ALKPHOS 218 (H) 06/14/2020 0810   ALKPHOS 159 (H) 11/06/2016 1102   AST 64 (H) 06/14/2020 0810   AST 52 (H) 11/06/2016 1102   ALT 28 06/14/2020 0810   ALT 58 (H) 11/06/2016 1102   BILITOT 0.4 06/14/2020 0810   BILITOT 0.53 11/06/2016 1102

## 2020-06-14 NOTE — Assessment & Plan Note (Signed)
She has bilateral lower extremity edema She will continue physical therapy for lymphedema treatment

## 2020-06-14 NOTE — Assessment & Plan Note (Signed)
She continues to have significant leg swelling and persistent malignant ascites She has developed significant pancytopenia We will proceed with treatment today along with blood transfusion support I plan to order CT imaging next week for further follow-up

## 2020-06-14 NOTE — Assessment & Plan Note (Signed)
Her neuropathy is stable We will continue reduced dose Taxol

## 2020-06-14 NOTE — Patient Instructions (Addendum)
[facility] Discharge Instructions for Patients Receiving Chemotherapy  Today you received the following chemotherapy agents: paclitaxel and carboplatin.  To help prevent nausea and vomiting after your treatment, we encourage you to take your nausea medication as directed.   If you develop nausea and vomiting that is not controlled by your nausea medication, call the clinic.   BELOW ARE SYMPTOMS THAT SHOULD BE REPORTED IMMEDIATELY:  *FEVER GREATER THAN 100.5 F  *CHILLS WITH OR WITHOUT FEVER  NAUSEA AND VOMITING THAT IS NOT CONTROLLED WITH YOUR NAUSEA MEDICATION  *UNUSUAL SHORTNESS OF BREATH  *UNUSUAL BRUISING OR BLEEDING  TENDERNESS IN MOUTH AND THROAT WITH OR WITHOUT PRESENCE OF ULCERS  *URINARY PROBLEMS  *BOWEL PROBLEMS  UNUSUAL RASH Items with * indicate a potential emergency and should be followed up as soon as possible.  Feel free to call the clinic should you have any questions or concerns. The clinic phone number is [phone].  Please show the [facility] at check-in to the Emergency Department and triage nurse.   Blood Transfusion, Adult, Care After This sheet gives you information about how to care for yourself after your procedure. Your doctor may also give you more specific instructions. If you have problems or questions, contact your doctor. What can I expect after the procedure? After the procedure, it is common to have:  Bruising and soreness at the IV site.  A fever or chills on the day of the procedure. This may be your body's response to the new blood cells received.  A headache. Follow these instructions at home: Insertion site care      Follow instructions from your doctor about how to take care of your insertion site. This is where an IV tube was put into your vein. Make sure you: ? Wash your hands with soap and water before and after you change your bandage (dressing). If you cannot use soap and water, use hand  sanitizer. ? Change your bandage as told by your doctor.  Check your insertion site every day for signs of infection. Check for: ? Redness, swelling, or pain. ? Bleeding from the site. ? Warmth. ? Pus or a bad smell. General instructions  Take over-the-counter and prescription medicines only as told by your doctor.  Rest as told by your doctor.  Go back to your normal activities as told by your doctor.  Keep all follow-up visits as told by your doctor. This is important. Contact a doctor if:  You have itching or red, swollen areas of skin (hives).  You feel worried or nervous (anxious).  You feel weak after doing your normal activities.  You have redness, swelling, warmth, or pain around the insertion site.  You have blood coming from the insertion site, and the blood does not stop with pressure.  You have pus or a bad smell coming from the insertion site. Get help right away if:  You have signs of a serious reaction. This may be coming from an allergy or the body's defense system (immune system). Signs include: ? Trouble breathing or shortness of breath. ? Swelling of the face or feeling warm (flushed). ? Fever or chills. ? Head, chest, or back pain. ? Dark pee (urine) or blood in the pee. ? Widespread rash. ? Fast heartbeat. ? Feeling dizzy or light-headed. You may receive your blood transfusion in an outpatient setting. If so, you will be told whom to contact to report any reactions. These symptoms may be an emergency. Do not wait  to see if the symptoms will go away. Get medical help right away. Call your local emergency services (911 in the [place] Do not drive yourself to the hospital. Summary  Bruising and soreness at the IV site are common.  Check your insertion site every day for signs of infection.  Rest as told by your doctor. Go back to your normal activities as told by your doctor.  Get help right away if you have signs of a serious reaction. This  information is not intended to replace advice given to you by your health care provider. Make sure you discuss any questions you have with your health care provider. Document Revised: 05/12/2019 Document Reviewed: 05/12/2019 Elsevier Patient Education  [place].

## 2020-06-14 NOTE — Progress Notes (Signed)
Call rec'vd from Dr. [name] - pts renal fxn fluctuating. For 06/14/20 keep Carbo dose at 330 mg  Keeping Taxol at 131 mg/m2 also  Kennith Center, Pharm.D., CPP 06/14/2020@9 :01 AM

## 2020-06-15 LAB — TYPE AND SCREEN
ABO/RH(D): A POS
Antibody Screen: NEGATIVE
Unit division: 0

## 2020-06-15 LAB — BPAM RBC
Blood Product Expiration Date: 202107232359
ISSUE DATE / TIME: 202107151435
Unit Type and Rh: 600

## 2020-06-15 LAB — CA 125: Cancer Antigen (CA) 125: 1309 U/mL — ABNORMAL HIGH (ref 0.0–38.1)

## 2020-06-20 ENCOUNTER — Other Ambulatory Visit: Payer: Self-pay

## 2020-06-20 ENCOUNTER — Ambulatory Visit (HOSPITAL_COMMUNITY)
Admission: RE | Admit: 2020-06-20 | Discharge: 2020-06-20 | Disposition: A | Discharge status: Home / Self Care | Payer: PPO | Source: Ambulatory Visit | Attending: Hematology and Oncology | Admitting: Hematology and Oncology

## 2020-06-20 DIAGNOSIS — C561 Malignant neoplasm of right ovary: Secondary | ICD-10-CM | POA: Insufficient documentation

## 2020-06-20 DIAGNOSIS — C569 Malignant neoplasm of unspecified ovary: Secondary | ICD-10-CM | POA: Diagnosis not present

## 2020-06-20 MED ORDER — HEPARIN SOD (PORK) LOCK FLUSH 100 UNIT/ML IV SOLN
500.0000 [IU] | Freq: Once | INTRAVENOUS | Status: AC
  Administered 2020-06-20: 500 [IU] via INTRAVENOUS

## 2020-06-20 MED ORDER — IOHEXOL 300 MG/ML  SOLN
80.0000 mL | Freq: Once | INTRAMUSCULAR | Status: AC | PRN
  Administered 2020-06-20: 80 mL via INTRAVENOUS

## 2020-06-20 MED ORDER — SODIUM CHLORIDE (PF) 0.9 % IJ SOLN
INTRAMUSCULAR | Status: AC
  Filled 2020-06-20: qty 50

## 2020-06-20 MED ORDER — HEPARIN SOD (PORK) LOCK FLUSH 100 UNIT/ML IV SOLN
INTRAVENOUS | Status: AC
  Filled 2020-06-20: qty 5

## 2020-06-21 ENCOUNTER — Inpatient Hospital Stay: Payer: PPO | Admitting: Hematology and Oncology

## 2020-06-21 ENCOUNTER — Encounter: Payer: Self-pay | Admitting: Hematology and Oncology

## 2020-06-21 ENCOUNTER — Telehealth: Payer: Self-pay | Admitting: Hematology and Oncology

## 2020-06-21 ENCOUNTER — Other Ambulatory Visit: Payer: Self-pay

## 2020-06-21 DIAGNOSIS — E440 Moderate protein-calorie malnutrition: Secondary | ICD-10-CM

## 2020-06-21 DIAGNOSIS — Z5111 Encounter for antineoplastic chemotherapy: Secondary | ICD-10-CM | POA: Diagnosis not present

## 2020-06-21 DIAGNOSIS — R600 Localized edema: Secondary | ICD-10-CM

## 2020-06-21 DIAGNOSIS — C561 Malignant neoplasm of right ovary: Secondary | ICD-10-CM | POA: Diagnosis not present

## 2020-06-21 DIAGNOSIS — R180 Malignant ascites: Secondary | ICD-10-CM | POA: Diagnosis not present

## 2020-06-21 NOTE — Telephone Encounter (Signed)
Scheduled appts per 7/22 sch msg. Gave pt a print out of AVS.

## 2020-06-21 NOTE — Assessment & Plan Note (Signed)
This is due to venous compression Her leg swelling is improving clinically

## 2020-06-21 NOTE — Assessment & Plan Note (Signed)
I have reviewed multiple imaging studies with the patient and her husband She has partial response to therapy She has near complete resolution of ascites Her right leg swelling is better Overall, the plan would be to proceed with several more cycles of chemotherapy and repeat another imaging study after cycle 6 She is not a candidate for interval debulking surgery yet Due to her severe pancytopenia recently, I recommend delaying her next cycle of treatment for few days to allow bone marrow recovery She is in agreement with the plan of care

## 2020-06-21 NOTE — Assessment & Plan Note (Signed)
We discussed the importance of adequate nutritional intake to aid bone marrow recovery She will continue frequent small meals and nutritional supplement as tolerated

## 2020-06-21 NOTE — Assessment & Plan Note (Signed)
Her ascites is almost completely resolved She does not need paracentesis this week If she felt bloated again next week, she is instructed to call us to schedule paracentesis

## 2020-06-21 NOTE — Progress Notes (Signed)
[facility] OFFICE PROGRESS NOTE  Patient Care Team: [name], MD as PCP - Onalee Hua, Wille Glaser, OD as Referring Physician (Optometry) [name], MD as Consulting Physician (Dermatology) Latanya Maudlin, MD as Consulting Physician (Orthopedic Surgery) Inda Castle, MD (Inactive) as Consulting Physician (Gastroenterology) Jacqulyn Liner, RN as Oncology Nurse Navigator (Oncology) Heath Lark, MD as Consulting Physician (Hematology and Oncology)  ASSESSMENT & PLAN:  Ovarian cancer [facility]) I have reviewed multiple imaging studies with the patient and her husband She has partial response to therapy She has near complete resolution of ascites Her right leg swelling is better Overall, the plan would be to proceed with several more cycles of chemotherapy and repeat another imaging study after cycle 6 She is not a candidate for interval debulking surgery yet Due to her severe pancytopenia recently, I recommend delaying her next cycle of treatment for few days to allow bone marrow recovery She is in agreement with the plan of care  Malignant ascites Her ascites is almost completely resolved She does not need paracentesis this week If she felt bloated again next week, she is instructed to call us to schedule paracentesis  Leg edema, right This is due to venous compression Her leg swelling is improving clinically  Protein-calorie malnutrition, moderate ([place]) We discussed the importance of adequate nutritional intake to aid bone marrow recovery She will continue frequent small meals and nutritional supplement as tolerated   No orders of the defined types were placed in this encounter.   All questions were answered. The patient knows to call the clinic with any problems, questions or concerns. The total time spent in the appointment was 30 minutes encounter with patients including review of chart and various tests results, discussions about plan of care and  coordination of care plan   Heath Lark, MD 06/21/2020 9:26 AM  INTERVAL HISTORY: Please see below for problem oriented charting. She is seen today with her husband to review imaging studies result Her recent tumor marker is improving Her leg swelling is better She denies significant abdominal bloating this week She continues to focus on frequent small meals She denies abdominal pain, nausea or constipation  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[place])  02/26/2016 Initial Diagnosis   Left breast biopsy 11:00 position: invasive ductal carcinoma with DCIS, ER 90%, PR 10%, HER-2 negative, Ki-67 30%, grade 2, 2.2 cm palpable lesion T2 N0 stage II a clinical stage   03/18/2016 Surgery   Left lumpectomy: Invasive ductal carcinoma, grade 2, 6.3 cm, with high-grade DCIS, margins negative, 0/4 lymph nodes negative, ER 90%, via 10%, HER-2 negative ratio 0.97, Ki-67 30%, T3 N0 stage IIB   03/25/2016 Procedure   Genetic testing is negative for pathogenic mutations within any of the 20 Genes on the breast/ovarian cancer panel   04/04/2016 Oncotype testing   Oncotype DX recurrence score 37, 25% 10 year distant risk of recurrence   04/17/2016 - 07/31/2016 Chemotherapy   Adjuvant chemotherapy with dose dense Adriamycin and Cytoxan followed by Abraxane weekly 8 ( discontinued due to neuropathy)   09/01/2016 - 09/26/2016 Radiation Therapy   Adj XRT 1) Left breast: 42.5 Gy in 17 fractions. 2) Left breast boost: 7.5 Gy in 3 fractions.   12/02/2016 -  Anti-estrogen oral therapy   Anastrozole 1 mg daily   Ovarian cancer ([place])  04/03/2020 Imaging   US pelvis Complex cystic and solid mass in LEFT adnexa 12.5 cm diameter question  cystic ovarian neoplasm; recommend correlation with serum tumor markers and further evaluation by MR imaging with and without contrast.     04/04/2020 Imaging   US venous Doppler No evidence of deep venous thrombosis  in the right lower extremity. Left common femoral vein also patent.   04/15/2020 Imaging   MRI pelvis 1. Large complex solid and cystic mass arising from the right adnexa measuring 10.8 by 11.5 by 11.1 cm. This has an aggressive appearance with extensive enhancing mural soft tissue components. Findings are highly suspicious for malignant ovarian neoplasm. 2. Extensive bilateral retroperitoneal and bilateral iliac adenopathy compatible with metastatic disease. 3. Signs of extensive peritoneal carcinomatosis including ascites, enhancement, thickening and nodularity of the peritoneal reflections, omental caking and bulky peritoneal nodularity. 4. Suspected serosal involvement of the dome of bladder with loss of normal fat plane. Cannot rule out mural invasion by tumor.   04/17/2020 Tumor Marker   Patient's tumor was tested for the following markers: CA-125 Results of the tumor marker test revealed 1762.   04/20/2020 Cancer Staging   Staging form: Ovary, Fallopian Tube, and Primary Peritoneal Carcinoma, AJCC 8th Edition - Clinical: FIGO Stage IIIC (cT3, cN1, cM0) - Signed by Heath Lark, MD on 04/20/2020   04/23/2020 Imaging   1. Redemonstrated dominant mixed solid and cystic mass arising from the vicinity of the right ovary measuring at least 11.5 x 10.7 cm, not significantly changed compared to prior MR, and consistent with primary ovarian malignancy.  2. Numerous bulky retroperitoneal, bilateral iliac, and pelvic sidewall lymph nodes. 3. Moderate volume ascites throughout the abdomen and pelvis with subtle thickening and nodularity throughout the peritoneum, and extensive bulky nodular metastatic disease of the omentum. 4. Constellation of findings is consistent with advanced nodal and peritoneal metastatic disease. 5. There are prominent subcentimeter epicardial lymph nodes, nonspecific although suspicious for nodal metastatic disease. No definite nodal metastatic disease in the chest. Attention  on follow-up. 6. There are multiple small subpleural nodules at the right lung base overlying the diaphragm, measuring up to 7 mm. These are generally nonspecific and less favored to represent pulmonary metastatic disease given distribution. Attention on follow-up. 7. Somewhat coarse contour of the liver, suggestive of cirrhosis, although out without overt morphologic stigmata. 8. Aortic Atherosclerosis (ICD10-I70.0).     04/24/2020 Procedure   Successful placement of a right internal jugular approach power injectable Port-A-Cath. The catheter is ready for immediate use.     05/02/2020 Tumor Marker   Patient's tumor was tested for the following markers: CA-125 Results of the tumor marker test revealed 1921   05/03/2020 -  Chemotherapy   The patient had carboplatin and taxol for chemotherapy treatment.     05/08/2020 Procedure   Successful ultrasound-guided paracentesis yielding 2.6 liters of peritoneal fluid.   05/16/2020 Procedure   Successful ultrasound-guided paracentesis yielding 3.2 liters of peritoneal fluid   05/25/2020 Procedure   Successful ultrasound-guided paracentesis yielding 3 liters of peritoneal fluid.     06/01/2020 Procedure   Successful ultrasound-guided therapeutic paracentesis yielding 1.7 liters of peritoneal fluid.   06/14/2020 Tumor Marker   Patient's tumor was tested for the following markers: CA-125 Results of the tumor marker test revealed 1309   06/20/2020 Imaging   1. Dominant mixed cystic and solid mass in the central pelvis is stable in the interval. 2. Clear interval decrease in retroperitoneal and pelvic sidewall lymphadenopathy. The bulky omental disease has also clearly decreased in the interval. 3. Interval decrease in ascites. 4. Stable 8 mm subpleural nodule along  the diaphragm. 5. Aortic Atherosclerosis (ICD10-I70.0).     REVIEW OF SYSTEMS:   Constitutional: Denies fevers, chills  Eyes: Denies blurriness of vision Ears, nose, mouth, throat, and  face: Denies mucositis or sore throat Respiratory: Denies cough, dyspnea or wheezes Cardiovascular: Denies palpitation, chest discomfort Gastrointestinal:  Denies nausea, heartburn or change in bowel habits Skin: Denies abnormal skin rashes Lymphatics: Denies new lymphadenopathy or easy bruising Neurological:Denies numbness, tingling or new weaknesses Behavioral/Psych: Mood is stable, no new changes  All other systems were reviewed with the patient and are negative.  I have reviewed the past medical history, past surgical history, social history and family history with the patient and they are unchanged from previous note.  ALLERGIES:  is allergic to keflex [cephalexin], meloxicam, prednisone, premarin [estrogens conjugated], and trovan [alatrofloxacin].  MEDICATIONS:  Current Outpatient Medications  Medication Sig Dispense Refill  . acetaminophen (TYLENOL) 325 MG tablet Take 325 mg by mouth every 6 (six) hours as needed for headache.    . allopurinol (ZYLOPRIM) 300 MG tablet Take 1/2 tablet Daily to Prevent Gout 90 tablet 3  . blood glucose meter kit and supplies KIT Dispense based on patient and insurance preference. Use to check blood sugar once daily. Dx: E11.22, N18.30 1 each 12  . Cholecalciferol (VITAMIN D3) 5000 units CAPS Take 5,000 Units by mouth daily.    Marland Kitchen dexamethasone (DECADRON) 4 MG tablet Take 2 tabs at the night before and 2 tab the morning of chemotherapy, every 3 weeks, by mouth 36 tablet 5  . doxycycline (VIBRA-TABS) 100 MG tablet Take 1 tablet (100 mg total) by mouth 2 (two) times daily. 14 tablet 0  . gabapentin (NEURONTIN) 300 MG capsule Take 1 capsule 3 to 4  x /day as needed for pain (Patient taking differently: Take 300 mg by mouth See admin instructions. Take 300 mg 3 times daily may take a 4th 300 mg dose as needed for pain) 360 capsule 3  . glimepiride (AMARYL) 1 MG tablet Take 1 tab by mouth in the morning if fasting is running 150+. Please do not take this  medication if you are ill or not eating as this can cause a low blood sugar. 90 tablet 1  . glucose blood (FREESTYLE TEST STRIPS) test strip Check Blood Sugar Daily as directed (Dx: E11.29) 100 strip 3  . HYDROcodone-acetaminophen (NORCO/VICODIN) 5-325 MG tablet Take 1 tablet by mouth every 6 (six) hours as needed for moderate pain. 30 tablet 0  . Lancets (FREESTYLE) lancets CHECK BLOOD SUGAR DAILY AS DIRECTED 100 each 3  . lidocaine-prilocaine (EMLA) cream Apply to affected area once (Patient taking differently: Apply 1 application topically daily as needed (port access). ) 30 g 3  . loratadine (CLARITIN) 10 MG tablet Take 10 mg by mouth daily as needed for allergies.    . Magnesium ([employer] MAGNESIUM) 250 MG TABS Take 250 mg by mouth 2 (two) times daily.    . metoprolol succinate (TOPROL-XL) 25 MG 24 hr tablet Take 1 tablet Daily for BP (Patient taking differently: Take 25 mg by mouth daily. ) 90 tablet 3  . mirabegron ER (MYRBETRIQ) 50 MG TB24 tablet Take 1 tablet (50 mg total) by mouth daily. 30 tablet 2  . ondansetron (ZOFRAN) 8 MG tablet Take 1 tablet (8 mg total) by mouth 2 (two) times daily as needed for refractory nausea / vomiting. Start on day 3 after carboplatin chemo. (Patient not taking: Reported on 05/09/2020) 30 tablet 1  . prochlorperazine (COMPAZINE) 10 MG tablet  Take 1 tablet (10 mg total) by mouth every 6 (six) hours as needed for nausea or vomiting. (Patient not taking: Reported on 05/09/2020) 60 tablet 9  . vitamin C (ASCORBIC ACID) 500 MG tablet Take 500 mg by mouth every evening.      No current facility-administered medications for this visit.    PHYSICAL EXAMINATION: ECOG PERFORMANCE STATUS: 1 - Symptomatic but completely ambulatory  Vitals:   06/21/20 0816  BP: 126/65  Pulse: 85  Resp: 18  Temp: 98.1 F (36.7 C)  SpO2: 100%   Filed Weights   06/21/20 0816  Weight: 140 lb (63.5 kg)    GENERAL:alert, no distress and comfortable HEART: Her right lower extremity edema  is better ABDOMEN:abdomen soft, mildly distended  LABORATORY DATA:  I have reviewed the data as listed    Component Value Date/Time   NA 143 06/14/2020 0810   NA 141 11/06/2016 1102   K 3.9 06/14/2020 0810   K 3.9 11/06/2016 1102   CL 109 06/14/2020 0810   CO2 23 06/14/2020 0810   CO2 27 11/06/2016 1102   GLUCOSE 190 (H) 06/14/2020 0810   GLUCOSE 130 11/06/2016 1102   BUN 20 06/14/2020 0810   BUN 18.0 11/06/2016 1102   CREATININE 1.14 (H) 06/14/2020 0810   CREATININE 1.48 (H) 05/09/2020 1115   CREATININE 1.3 (H) 11/06/2016 1102   CALCIUM 9.2 06/14/2020 0810   CALCIUM 10.2 11/06/2016 1102   PROT 6.1 (L) 06/14/2020 0810   PROT 7.1 11/06/2016 1102   ALBUMIN 2.8 (L) 06/14/2020 0810   ALBUMIN 3.7 11/06/2016 1102   AST 64 (H) 06/14/2020 0810   AST 52 (H) 11/06/2016 1102   ALT 28 06/14/2020 0810   ALT 58 (H) 11/06/2016 1102   ALKPHOS 218 (H) 06/14/2020 0810   ALKPHOS 159 (H) 11/06/2016 1102   BILITOT 0.4 06/14/2020 0810   BILITOT 0.53 11/06/2016 1102   GFRNONAA 45 (L) 06/14/2020 0810   GFRNONAA 33 (L) 05/09/2020 1115   GFRAA 52 (L) 06/14/2020 0810   GFRAA 38 (L) 05/09/2020 1115    No results found for: SPEP, UPEP  Lab Results  Component Value Date   WBC 5.0 06/14/2020   NEUTROABS 4.3 06/14/2020   HGB 7.6 (L) 06/14/2020   HCT 23.3 (L) 06/14/2020   MCV 87.6 06/14/2020   PLT 126 (L) 06/14/2020      Chemistry      Component Value Date/Time   NA 143 06/14/2020 0810   NA 141 11/06/2016 1102   K 3.9 06/14/2020 0810   K 3.9 11/06/2016 1102   CL 109 06/14/2020 0810   CO2 23 06/14/2020 0810   CO2 27 11/06/2016 1102   BUN 20 06/14/2020 0810   BUN 18.0 11/06/2016 1102   CREATININE 1.14 (H) 06/14/2020 0810   CREATININE 1.48 (H) 05/09/2020 1115   CREATININE 1.3 (H) 11/06/2016 1102      Component Value Date/Time   CALCIUM 9.2 06/14/2020 0810   CALCIUM 10.2 11/06/2016 1102   ALKPHOS 218 (H) 06/14/2020 0810   ALKPHOS 159 (H) 11/06/2016 1102   AST 64 (H) 06/14/2020  0810   AST 52 (H) 11/06/2016 1102   ALT 28 06/14/2020 0810   ALT 58 (H) 11/06/2016 1102   BILITOT 0.4 06/14/2020 0810   BILITOT 0.53 11/06/2016 1102       RADIOGRAPHIC STUDIES: I have reviewed multiple imaging studies with the patient and her husband I have personally reviewed the radiological images as listed and agreed with the findings in the  report. CT ABDOMEN PELVIS W CONTRAST  Result Date: 06/20/2020 CLINICAL DATA:  Ovarian cancer.  Restaging. EXAM: CT ABDOMEN AND PELVIS WITH CONTRAST TECHNIQUE: Multidetector CT imaging of the abdomen and pelvis was performed using the standard protocol following bolus administration of intravenous contrast. CONTRAST:  51m OMNIPAQUE IOHEXOL 300 MG/ML  SOLN COMPARISON:  04/23/2020 FINDINGS: Lower chest: Dependent atelectasis noted with tiny left effusion. 8 mm subpleural nodule along the diaphragm is similar to prior. Hepatobiliary: No suspicious focal abnormality within the liver parenchyma. There is no evidence for gallstones, gallbladder wall thickening, or pericholecystic fluid. No intrahepatic or extrahepatic biliary dilation. Pancreas: Pancreas is diffusely atrophic without main duct dilatation. Spleen: No splenomegaly. No focal mass lesion. Adrenals/Urinary Tract: No adrenal nodule or mass. Stable 14 mm right upper pole renal cyst. Left kidney unremarkable. No evidence for hydroureter. The urinary bladder appears normal for the degree of distention. Stomach/Bowel: Stomach is unremarkable. No gastric wall thickening. No evidence of outlet obstruction. Duodenum is normally positioned as is the ligament of Treitz. No small bowel wall thickening. No small bowel dilatation. The terminal ileum is normal. The appendix is not visualized, but there is no edema or inflammation in the region of the cecum. No gross colonic mass. No colonic wall thickening. Vascular/Lymphatic: There is abdominal aortic atherosclerosis without aneurysm. No gastrohepatic or  hepatoduodenal ligament lymphadenopathy. Retroperitoneal lymphadenopathy seen previously has decreased in the interval 6 mm short axis left para-aortic node on 28/3 was 16 mm short axis when I remeasure in a similar fashion on the prior study. Aortocaval node measured previously at 2.1 cm short axis is now 1.3 cm short axis on 38/3. 5 mm short axis left external iliac node on 62/3 was 11 mm previously. 8 mm short axis right external iliac node on 64/3 was 16 mm previously. Reproductive: Uterus surgically absent. Bulky cystic and solid mass in the central pelvis measures 11.5 x 10.7 cm today compared to 11.5 x 10.7 cm previously, stable. Left ovary stable. Other: Small volume free fluid noted around the liver and spleen and in the cul-de-sac. Fluid volume is decreased in the interval. Bulky omental disease again noted but is generally decreased. Nodular omental lesion in the right abdomen (image 44/3) is 2.7 x 2.5 cm today compared to 4.1 x 3.6 cm when I remeasure in a similar fashion on the prior study. Midline low omental lesion measuring 6.5 x 1.6 cm on 58/3 today was 8.6 x 2.3 cm when I remeasure in a similar fashion on the previous study. Musculoskeletal: No worrisome lytic or sclerotic osseous abnormality. IMPRESSION: 1. Dominant mixed cystic and solid mass in the central pelvis is stable in the interval. 2. Clear interval decrease in retroperitoneal and pelvic sidewall lymphadenopathy. The bulky omental disease has also clearly decreased in the interval. 3. Interval decrease in ascites. 4. Stable 8 mm subpleural nodule along the diaphragm. 5. Aortic Atherosclerosis (ICD10-I70.0). Electronically Signed   By: EMisty StanleyM.D.   On: 06/20/2020 14:55   [place]  Result Date: 06/01/2020 INDICATION: Patient with history of metastatic ovarian cancer with recurrent malignant ascites. Request received for therapeutic paracentesis. EXAM: ULTRASOUND GUIDED THERAPEUTIC PARACENTESIS MEDICATIONS: None  COMPLICATIONS: None immediate. PROCEDURE: Informed written consent was obtained from the patient after a discussion of the risks, benefits and alternatives to treatment. A timeout was performed prior to the initiation of the procedure. Initial ultrasound scanning demonstrates a small amount of ascites within the left lower abdominal quadrant. The left lower abdomen was prepped and draped in  the usual sterile fashion. 1% lidocaine was used for local anesthesia. Following this, a 6 Fr Safe-T-Centesis catheter was introduced. An ultrasound image was saved for documentation purposes. The paracentesis was performed. The catheter was removed and a dressing was applied. The patient tolerated the procedure well without immediate post procedural complication. FINDINGS: A total of approximately 1.7 liters of blood-tinged fluid was removed. IMPRESSION: Successful ultrasound-guided therapeutic paracentesis yielding 1.7 liters of peritoneal fluid. Read by: Rowe Robert, PA-C Electronically Signed   By: Markus Daft M.D.   On: 06/01/2020 15:53   IR Paracentesis  Result Date: 05/25/2020 INDICATION: Patient with history of ovarian cancer. Recurrent malignant ascites. Request for therapeutic paracentesis. EXAM: ULTRASOUND GUIDED RIGHT LOWER QUADRANT PARACENTESIS MEDICATIONS: None. COMPLICATIONS: None immediate. PROCEDURE: Informed written consent was obtained from the patient after a discussion of the risks, benefits and alternatives to treatment. A timeout was performed prior to the initiation of the procedure. Initial ultrasound scanning demonstrates a large amount of ascites within the right lower abdominal quadrant. The right lower abdomen was prepped and draped in the usual sterile fashion. 1% lidocaine was used for local anesthesia. Following this, a 19 gauge, 7-cm, Yueh catheter was introduced. An ultrasound image was saved for documentation purposes. The paracentesis was performed. The catheter was removed and a dressing  was applied. The patient tolerated the procedure well without immediate post procedural complication. FINDINGS: A total of approximately 3 L of serosanguineous fluid was removed. IMPRESSION: Successful ultrasound-guided paracentesis yielding 3 liters of peritoneal fluid. Read by: Ascencion Dike PA-C Electronically Signed   By: Corrie Mckusick D.O.   On: 05/25/2020 13:53

## 2020-07-10 ENCOUNTER — Inpatient Hospital Stay: Payer: PPO | Attending: Gynecologic Oncology | Admitting: Hematology and Oncology

## 2020-07-10 ENCOUNTER — Inpatient Hospital Stay: Payer: PPO

## 2020-07-10 ENCOUNTER — Other Ambulatory Visit: Payer: Self-pay

## 2020-07-10 ENCOUNTER — Encounter: Payer: Self-pay | Admitting: Hematology and Oncology

## 2020-07-10 DIAGNOSIS — Z79811 Long term (current) use of aromatase inhibitors: Secondary | ICD-10-CM | POA: Insufficient documentation

## 2020-07-10 DIAGNOSIS — C561 Malignant neoplasm of right ovary: Secondary | ICD-10-CM

## 2020-07-10 DIAGNOSIS — G620 Drug-induced polyneuropathy: Secondary | ICD-10-CM | POA: Diagnosis not present

## 2020-07-10 DIAGNOSIS — D630 Anemia in neoplastic disease: Secondary | ICD-10-CM | POA: Diagnosis not present

## 2020-07-10 DIAGNOSIS — Z5111 Encounter for antineoplastic chemotherapy: Secondary | ICD-10-CM | POA: Diagnosis not present

## 2020-07-10 DIAGNOSIS — T451X5S Adverse effect of antineoplastic and immunosuppressive drugs, sequela: Secondary | ICD-10-CM | POA: Insufficient documentation

## 2020-07-10 DIAGNOSIS — Z170 Estrogen receptor positive status [ER+]: Secondary | ICD-10-CM

## 2020-07-10 DIAGNOSIS — C569 Malignant neoplasm of unspecified ovary: Secondary | ICD-10-CM

## 2020-07-10 DIAGNOSIS — Z923 Personal history of irradiation: Secondary | ICD-10-CM | POA: Insufficient documentation

## 2020-07-10 DIAGNOSIS — E1142 Type 2 diabetes mellitus with diabetic polyneuropathy: Secondary | ICD-10-CM | POA: Insufficient documentation

## 2020-07-10 DIAGNOSIS — C50212 Malignant neoplasm of upper-inner quadrant of left female breast: Secondary | ICD-10-CM | POA: Diagnosis not present

## 2020-07-10 DIAGNOSIS — C786 Secondary malignant neoplasm of retroperitoneum and peritoneum: Secondary | ICD-10-CM | POA: Diagnosis not present

## 2020-07-10 DIAGNOSIS — N183 Chronic kidney disease, stage 3 unspecified: Secondary | ICD-10-CM

## 2020-07-10 DIAGNOSIS — T451X5A Adverse effect of antineoplastic and immunosuppressive drugs, initial encounter: Secondary | ICD-10-CM | POA: Diagnosis not present

## 2020-07-10 DIAGNOSIS — D61818 Other pancytopenia: Secondary | ICD-10-CM | POA: Insufficient documentation

## 2020-07-10 DIAGNOSIS — Z90721 Acquired absence of ovaries, unilateral: Secondary | ICD-10-CM | POA: Insufficient documentation

## 2020-07-10 DIAGNOSIS — E1122 Type 2 diabetes mellitus with diabetic chronic kidney disease: Secondary | ICD-10-CM

## 2020-07-10 DIAGNOSIS — Z95828 Presence of other vascular implants and grafts: Secondary | ICD-10-CM

## 2020-07-10 LAB — CMP (CANCER CENTER ONLY)
ALT: 73 U/L — ABNORMAL HIGH (ref 0–44)
AST: 91 U/L — ABNORMAL HIGH (ref 15–41)
Albumin: 3.5 g/dL (ref 3.5–5.0)
Alkaline Phosphatase: 215 U/L — ABNORMAL HIGH (ref 38–126)
Anion gap: 9 (ref 5–15)
BUN: 35 mg/dL — ABNORMAL HIGH (ref 8–23)
CO2: 25 mmol/L (ref 22–32)
Calcium: 10.6 mg/dL — ABNORMAL HIGH (ref 8.9–10.3)
Chloride: 108 mmol/L (ref 98–111)
Creatinine: 1.18 mg/dL — ABNORMAL HIGH (ref 0.44–1.00)
GFR, Est AFR Am: 50 mL/min — ABNORMAL LOW (ref >60)
GFR, Estimated: 43 mL/min — ABNORMAL LOW (ref >60)
Glucose, Bld: 108 mg/dL — ABNORMAL HIGH (ref 70–99)
Potassium: 3.7 mmol/L (ref 3.5–5.1)
Sodium: 142 mmol/L (ref 135–145)
Total Bilirubin: 0.5 mg/dL (ref 0.3–1.2)
Total Protein: 7.2 g/dL (ref 6.5–8.1)

## 2020-07-10 LAB — CBC WITH DIFFERENTIAL (CANCER CENTER ONLY)
Abs Immature Granulocytes: 0.06 10*3/uL (ref 0.00–0.07)
Basophils Absolute: 0 10*3/uL (ref 0.0–0.1)
Basophils Relative: 0 %
Eosinophils Absolute: 0 10*3/uL (ref 0.0–0.5)
Eosinophils Relative: 0 %
HCT: 27.4 % — ABNORMAL LOW (ref 36.0–46.0)
Hemoglobin: 8.9 g/dL — ABNORMAL LOW (ref 12.0–15.0)
Immature Granulocytes: 1 %
Lymphocytes Relative: 11 %
Lymphs Abs: 0.8 10*3/uL (ref 0.7–4.0)
MCH: 30.3 pg (ref 26.0–34.0)
MCHC: 32.5 g/dL (ref 30.0–36.0)
MCV: 93.2 fL (ref 80.0–100.0)
Monocytes Absolute: 0.3 10*3/uL (ref 0.1–1.0)
Monocytes Relative: 4 %
Neutro Abs: 6.3 10*3/uL (ref 1.7–7.7)
Neutrophils Relative %: 84 %
Platelet Count: 248 10*3/uL (ref 150–400)
RBC: 2.94 MIL/uL — ABNORMAL LOW (ref 3.87–5.11)
RDW: 23.1 % — ABNORMAL HIGH (ref 11.5–15.5)
WBC Count: 7.5 10*3/uL (ref 4.0–10.5)
nRBC: 0.3 % — ABNORMAL HIGH (ref 0.0–0.2)

## 2020-07-10 MED ORDER — PALONOSETRON HCL INJECTION 0.25 MG/5ML
INTRAVENOUS | Status: AC
  Filled 2020-07-10: qty 5

## 2020-07-10 MED ORDER — SODIUM CHLORIDE 0.9 % IV SOLN
Freq: Once | INTRAVENOUS | Status: AC
  Filled 2020-07-10: qty 250

## 2020-07-10 MED ORDER — SODIUM CHLORIDE 0.9% FLUSH
10.0000 mL | INTRAVENOUS | Status: DC | PRN
  Administered 2020-07-10: 10 mL
  Filled 2020-07-10: qty 10

## 2020-07-10 MED ORDER — SODIUM CHLORIDE 0.9 % IV SOLN
150.0000 mg | Freq: Once | INTRAVENOUS | Status: AC
  Administered 2020-07-10: 150 mg via INTRAVENOUS
  Filled 2020-07-10: qty 150

## 2020-07-10 MED ORDER — DIPHENHYDRAMINE HCL 50 MG/ML IJ SOLN
INTRAMUSCULAR | Status: AC
  Filled 2020-07-10: qty 1

## 2020-07-10 MED ORDER — SODIUM CHLORIDE 0.9 % IV SOLN
10.0000 mg | Freq: Once | INTRAVENOUS | Status: AC
  Administered 2020-07-10: 10 mg via INTRAVENOUS
  Filled 2020-07-10: qty 10

## 2020-07-10 MED ORDER — PALONOSETRON HCL INJECTION 0.25 MG/5ML
0.2500 mg | Freq: Once | INTRAVENOUS | Status: AC
  Administered 2020-07-10: 0.25 mg via INTRAVENOUS

## 2020-07-10 MED ORDER — FAMOTIDINE IN NACL 20-0.9 MG/50ML-% IV SOLN
INTRAVENOUS | Status: AC
  Filled 2020-07-10: qty 50

## 2020-07-10 MED ORDER — DIPHENHYDRAMINE HCL 50 MG/ML IJ SOLN
50.0000 mg | Freq: Once | INTRAMUSCULAR | Status: AC
  Administered 2020-07-10: 50 mg via INTRAVENOUS

## 2020-07-10 MED ORDER — HEPARIN SOD (PORK) LOCK FLUSH 100 UNIT/ML IV SOLN
500.0000 [IU] | Freq: Once | INTRAVENOUS | Status: AC | PRN
  Administered 2020-07-10: 500 [IU]
  Filled 2020-07-10: qty 5

## 2020-07-10 MED ORDER — SODIUM CHLORIDE 0.9% FLUSH
10.0000 mL | Freq: Once | INTRAVENOUS | Status: AC
  Administered 2020-07-10: 10 mL
  Filled 2020-07-10: qty 10

## 2020-07-10 MED ORDER — FAMOTIDINE IN NACL 20-0.9 MG/50ML-% IV SOLN
20.0000 mg | Freq: Once | INTRAVENOUS | Status: AC
  Administered 2020-07-10: 20 mg via INTRAVENOUS

## 2020-07-10 MED ORDER — SODIUM CHLORIDE 0.9 % IV SOLN
330.0000 mg | Freq: Once | INTRAVENOUS | Status: AC
  Administered 2020-07-10: 330 mg via INTRAVENOUS
  Filled 2020-07-10: qty 33

## 2020-07-10 MED ORDER — SODIUM CHLORIDE 0.9 % IV SOLN
131.2500 mg/m2 | Freq: Once | INTRAVENOUS | Status: AC
  Administered 2020-07-10: 228 mg via INTRAVENOUS
  Filled 2020-07-10: qty 38

## 2020-07-10 NOTE — Assessment & Plan Note (Signed)
Her neuropathy is stable We will continue reduced dose Taxol

## 2020-07-10 NOTE — Assessment & Plan Note (Signed)
She has fluctuation of her renal function We will continue to monitor carefully I do not plan to increase the dose of her carboplatin for now

## 2020-07-10 NOTE — Progress Notes (Signed)
Ok to proceed with today's labs per Dr. [name].

## 2020-07-10 NOTE — Progress Notes (Signed)
[place] OFFICE PROGRESS NOTE  Patient Care Team: [name], MD as PCP - Onalee Hua, Wille Glaser, OD as Referring Physician (Optometry) [name], MD as Consulting Physician (Dermatology) Latanya Maudlin, MD as Consulting Physician (Orthopedic Surgery) Inda Castle, MD (Inactive) as Consulting Physician (Gastroenterology) Jacqulyn Liner, RN as Oncology Nurse Navigator (Oncology) Heath Lark, MD as Consulting Physician (Hematology and Oncology)  ASSESSMENT & PLAN:  Ovarian cancer [facility]) Overall, she continues to improve Even though she has lost a bit of weight, her pancytopenia is better with additional days to allow recovery and she have resolution of ascites She has persistent right lower extremity edema but not worse We will continue at previous dose reduction as before I plan to see her back again in 3-1/2 weeks for further follow-up The plan will be to proceed with 6 cycles of treatment before repeating another imaging study and with the possibility of surgery if she have good response noted on her next CT  Anemia in neoplastic disease By spacing out her treatment, this has allowed for recovery of pancytopenia She is still anemic but not symptomatic She does not need transfusion support  Chemotherapy-induced peripheral neuropathy ([place]) Her neuropathy is stable We will continue reduced dose Taxol  CKD stage 3 due to type 2 diabetes mellitus ([place]) She has fluctuation of her renal function We will continue to monitor carefully I do not plan to increase the dose of her carboplatin for now   No orders of the defined types were placed in this encounter.   All questions were answered. The patient knows to call the clinic with any problems, questions or concerns. The total time spent in the appointment was 25 minutes encounter with patients including review of chart and various tests results, discussions about plan of care and coordination of care plan    Heath Lark, MD 07/10/2020 11:28 AM  INTERVAL HISTORY: Please see below for problem oriented charting. She returns for cycle 4 of chemotherapy She tolerated last cycle well Denies worsening peripheral neuropathy Her leg swelling is stable Denies recurrence of ascites No recent nausea or constipation  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[place])  02/26/2016 Initial Diagnosis   Left breast biopsy 11:00 position: invasive ductal carcinoma with DCIS, ER 90%, PR 10%, HER-2 negative, Ki-67 30%, grade 2, 2.2 cm palpable lesion T2 N0 stage II a clinical stage   03/18/2016 Surgery   Left lumpectomy: Invasive ductal carcinoma, grade 2, 6.3 cm, with high-grade DCIS, margins negative, 0/4 lymph nodes negative, ER 90%, via 10%, HER-2 negative ratio 0.97, Ki-67 30%, T3 N0 stage IIB   03/25/2016 Procedure   Genetic testing is negative for pathogenic mutations within any of the 20 Genes on the breast/ovarian cancer panel   04/04/2016 Oncotype testing   Oncotype DX recurrence score 37, 25% 10 year distant risk of recurrence   04/17/2016 - 07/31/2016 Chemotherapy   Adjuvant chemotherapy with dose dense Adriamycin and Cytoxan followed by Abraxane weekly 8 ( discontinued due to neuropathy)   09/01/2016 - 09/26/2016 Radiation Therapy   Adj XRT 1) Left breast: 42.5 Gy in 17 fractions. 2) Left breast boost: 7.5 Gy in 3 fractions.   12/02/2016 -  Anti-estrogen oral therapy   Anastrozole 1 mg daily   Ovarian cancer ([place])  04/03/2020 Imaging   US pelvis Complex cystic and solid mass in LEFT adnexa 12.5 cm diameter question cystic ovarian neoplasm; recommend correlation with serum tumor markers  and further evaluation by MR imaging with and without contrast.     04/04/2020 Imaging   US venous Doppler No evidence of deep venous thrombosis in the right lower extremity. Left common femoral vein also patent.   04/15/2020 Imaging   MRI pelvis 1.  Large complex solid and cystic mass arising from the right adnexa measuring 10.8 by 11.5 by 11.1 cm. This has an aggressive appearance with extensive enhancing mural soft tissue components. Findings are highly suspicious for malignant ovarian neoplasm. 2. Extensive bilateral retroperitoneal and bilateral iliac adenopathy compatible with metastatic disease. 3. Signs of extensive peritoneal carcinomatosis including ascites, enhancement, thickening and nodularity of the peritoneal reflections, omental caking and bulky peritoneal nodularity. 4. Suspected serosal involvement of the dome of bladder with loss of normal fat plane. Cannot rule out mural invasion by tumor.   04/17/2020 Tumor Marker   Patient's tumor was tested for the following markers: CA-125 Results of the tumor marker test revealed 1762.   04/20/2020 Cancer Staging   Staging form: Ovary, Fallopian Tube, and Primary Peritoneal Carcinoma, AJCC 8th Edition - Clinical: FIGO Stage IIIC (cT3, cN1, cM0) - Signed by Heath Lark, MD on 04/20/2020   04/23/2020 Imaging   1. Redemonstrated dominant mixed solid and cystic mass arising from the vicinity of the right ovary measuring at least 11.5 x 10.7 cm, not significantly changed compared to prior MR, and consistent with primary ovarian malignancy.  2. Numerous bulky retroperitoneal, bilateral iliac, and pelvic sidewall lymph nodes. 3. Moderate volume ascites throughout the abdomen and pelvis with subtle thickening and nodularity throughout the peritoneum, and extensive bulky nodular metastatic disease of the omentum. 4. Constellation of findings is consistent with advanced nodal and peritoneal metastatic disease. 5. There are prominent subcentimeter epicardial lymph nodes, nonspecific although suspicious for nodal metastatic disease. No definite nodal metastatic disease in the chest. Attention on follow-up. 6. There are multiple small subpleural nodules at the right lung base overlying the diaphragm,  measuring up to 7 mm. These are generally nonspecific and less favored to represent pulmonary metastatic disease given distribution. Attention on follow-up. 7. Somewhat coarse contour of the liver, suggestive of cirrhosis, although out without overt morphologic stigmata. 8. Aortic Atherosclerosis (ICD10-I70.0).     04/24/2020 Procedure   Successful placement of a right internal jugular approach power injectable Port-A-Cath. The catheter is ready for immediate use.     05/02/2020 Tumor Marker   Patient's tumor was tested for the following markers: CA-125 Results of the tumor marker test revealed 1921   05/03/2020 -  Chemotherapy   The patient had carboplatin and taxol for chemotherapy treatment.     05/08/2020 Procedure   Successful ultrasound-guided paracentesis yielding 2.6 liters of peritoneal fluid.   05/16/2020 Procedure   Successful ultrasound-guided paracentesis yielding 3.2 liters of peritoneal fluid   05/25/2020 Procedure   Successful ultrasound-guided paracentesis yielding 3 liters of peritoneal fluid.     06/01/2020 Procedure   Successful ultrasound-guided therapeutic paracentesis yielding 1.7 liters of peritoneal fluid.   06/14/2020 Tumor Marker   Patient's tumor was tested for the following markers: CA-125 Results of the tumor marker test revealed 1309   06/20/2020 Imaging   1. Dominant mixed cystic and solid mass in the central pelvis is stable in the interval. 2. Clear interval decrease in retroperitoneal and pelvic sidewall lymphadenopathy. The bulky omental disease has also clearly decreased in the interval. 3. Interval decrease in ascites. 4. Stable 8 mm subpleural nodule along the diaphragm. 5. Aortic Atherosclerosis (ICD10-I70.0).  REVIEW OF SYSTEMS:   Constitutional: Denies fevers, chills  Eyes: Denies blurriness of vision Ears, nose, mouth, throat, and face: Denies mucositis or sore throat Respiratory: Denies cough, dyspnea or wheezes Cardiovascular: Denies  palpitation, chest discomfort  Gastrointestinal:  Denies nausea, heartburn or change in bowel habits Skin: Denies abnormal skin rashes Lymphatics: Denies new lymphadenopathy or easy bruising Behavioral/Psych: Mood is stable, no new changes  All other systems were reviewed with the patient and are negative.  I have reviewed the past medical history, past surgical history, social history and family history with the patient and they are unchanged from previous note.  ALLERGIES:  is allergic to keflex [cephalexin], meloxicam, prednisone, premarin [estrogens conjugated], and trovan [alatrofloxacin].  MEDICATIONS:  Current Outpatient Medications  Medication Sig Dispense Refill  . acetaminophen (TYLENOL) 325 MG tablet Take 325 mg by mouth every 6 (six) hours as needed for headache.    . allopurinol (ZYLOPRIM) 300 MG tablet Take 1/2 tablet Daily to Prevent Gout 90 tablet 3  . blood glucose meter kit and supplies KIT Dispense based on patient and insurance preference. Use to check blood sugar once daily. Dx: E11.22, N18.30 1 each 12  . Cholecalciferol (VITAMIN D3) 5000 units CAPS Take 5,000 Units by mouth daily.    Marland Kitchen dexamethasone (DECADRON) 4 MG tablet Take 2 tabs at the night before and 2 tab the morning of chemotherapy, every 3 weeks, by mouth 36 tablet 5  . doxycycline (VIBRA-TABS) 100 MG tablet Take 1 tablet (100 mg total) by mouth 2 (two) times daily. 14 tablet 0  . gabapentin (NEURONTIN) 300 MG capsule Take 1 capsule 3 to 4  x /day as needed for pain (Patient taking differently: Take 300 mg by mouth See admin instructions. Take 300 mg 3 times daily may take a 4th 300 mg dose as needed for pain) 360 capsule 3  . glimepiride (AMARYL) 1 MG tablet Take 1 tab by mouth in the morning if fasting is running 150+. Please do not take this medication if you are ill or not eating as this can cause a low blood sugar. 90 tablet 1  . glucose blood (FREESTYLE TEST STRIPS) test strip Check Blood Sugar Daily as  directed (Dx: E11.29) 100 strip 3  . HYDROcodone-acetaminophen (NORCO/VICODIN) 5-325 MG tablet Take 1 tablet by mouth every 6 (six) hours as needed for moderate pain. 30 tablet 0  . Lancets (FREESTYLE) lancets CHECK BLOOD SUGAR DAILY AS DIRECTED 100 each 3  . lidocaine-prilocaine (EMLA) cream Apply to affected area once (Patient taking differently: Apply 1 application topically daily as needed (port access). ) 30 g 3  . loratadine (CLARITIN) 10 MG tablet Take 10 mg by mouth daily as needed for allergies.    . Magnesium ([employer] MAGNESIUM) 250 MG TABS Take 250 mg by mouth 2 (two) times daily.    . metoprolol succinate (TOPROL-XL) 25 MG 24 hr tablet Take 1 tablet Daily for BP (Patient taking differently: Take 25 mg by mouth daily. ) 90 tablet 3  . mirabegron ER (MYRBETRIQ) 50 MG TB24 tablet Take 1 tablet (50 mg total) by mouth daily. 30 tablet 2  . ondansetron (ZOFRAN) 8 MG tablet Take 1 tablet (8 mg total) by mouth 2 (two) times daily as needed for refractory nausea / vomiting. Start on day 3 after carboplatin chemo. (Patient not taking: Reported on 05/09/2020) 30 tablet 1  . prochlorperazine (COMPAZINE) 10 MG tablet Take 1 tablet (10 mg total) by mouth every 6 (six) hours as needed for  nausea or vomiting. (Patient not taking: Reported on 05/09/2020) 60 tablet 9  . vitamin C (ASCORBIC ACID) 500 MG tablet Take 500 mg by mouth every evening.      No current facility-administered medications for this visit.   Facility-Administered Medications Ordered in Other Visits  Medication Dose Route Frequency Provider Last Rate Last Admin  . CARBOplatin (PARAPLATIN) 330 mg in sodium chloride 0.9 % 250 mL chemo infusion  330 mg Intravenous Once [name], , MD      . dexamethasone (DECADRON) 10 mg in sodium chloride 0.9 % 50 mL IVPB  10 mg Intravenous Once [name], , MD      . famotidine (PEPCID) IVPB 20 mg premix  20 mg Intravenous Once [name], , MD      . fosaprepitant (EMEND) 150 mg in sodium chloride 0.9 % 145  mL IVPB  150 mg Intravenous Once [name], , MD      . heparin lock flush 100 unit/mL  500 Units Intracatheter Once PRN [name], , MD      . PACLitaxel (TAXOL) 228 mg in sodium chloride 0.9 % 250 mL chemo infusion (> 61m/m2)  131.25 mg/m2 (Treatment Plan Recorded) Intravenous Once , , MD      . sodium chloride flush (NS) 0.9 % injection 10 mL  10 mL Intracatheter PRN GAlvy Bimler , MD        PHYSICAL EXAMINATION: ECOG PERFORMANCE STATUS: 1 - Symptomatic but completely ambulatory  Vitals:   07/10/20 1019  BP: 134/68  Pulse: 96  Resp: 18  Temp: 98 F (36.7 C)  SpO2: 100%   Filed Weights   07/10/20 1019  Weight: 136 lb 6.4 oz (61.9 kg)    GENERAL:alert, no distress and comfortable SKIN: skin color, texture, turgor are normal, no rashes or significant lesions EYES: normal, Conjunctiva are pink and non-injected, sclera clear OROPHARYNX:no exudate, no erythema and lips, buccal mucosa, and tongue normal  NECK: supple, thyroid normal size, non-tender, without nodularity LYMPH:  no palpable lymphadenopathy in the cervical, axillary or inguinal LUNGS: clear to auscultation and percussion with normal breathing effort HEART: regular rate & rhythm and no murmurs with mild right lower extremity edema, stable ABDOMEN:abdomen soft, non-tender and normal bowel sounds Musculoskeletal:no cyanosis of digits and no clubbing  NEURO: alert & oriented x 3 with fluent speech, no focal motor/sensory deficits  LABORATORY DATA:  I have reviewed the data as listed    Component Value Date/Time   NA 142 07/10/2020 1002   NA 141 11/06/2016 1102   K 3.7 07/10/2020 1002   K 3.9 11/06/2016 1102   CL 108 07/10/2020 1002   CO2 25 07/10/2020 1002   CO2 27 11/06/2016 1102   GLUCOSE 108 (H) 07/10/2020 1002   GLUCOSE 130 11/06/2016 1102   BUN 35 (H) 07/10/2020 1002   BUN 18.0 11/06/2016 1102   CREATININE 1.18 (H) 07/10/2020 1002   CREATININE 1.48 (H) 05/09/2020 1115   CREATININE 1.3 (H)  11/06/2016 1102   CALCIUM 10.6 (H) 07/10/2020 1002   CALCIUM 10.2 11/06/2016 1102   PROT 7.2 07/10/2020 1002   PROT 7.1 11/06/2016 1102   ALBUMIN 3.5 07/10/2020 1002   ALBUMIN 3.7 11/06/2016 1102   AST 91 (H) 07/10/2020 1002   AST 52 (H) 11/06/2016 1102   ALT 73 (H) 07/10/2020 1002   ALT 58 (H) 11/06/2016 1102   ALKPHOS 215 (H) 07/10/2020 1002   ALKPHOS 159 (H) 11/06/2016 1102   BILITOT 0.5 07/10/2020 1002   BILITOT 0.53 11/06/2016 1102  GFRNONAA 43 (L) 07/10/2020 1002   GFRNONAA 33 (L) 05/09/2020 1115   GFRAA 50 (L) 07/10/2020 1002   GFRAA 38 (L) 05/09/2020 1115    No results found for: SPEP, UPEP  Lab Results  Component Value Date   WBC 7.5 07/10/2020   NEUTROABS 6.3 07/10/2020   HGB 8.9 (L) 07/10/2020   HCT 27.4 (L) 07/10/2020   MCV 93.2 07/10/2020   PLT 248 07/10/2020      Chemistry      Component Value Date/Time   NA 142 07/10/2020 1002   NA 141 11/06/2016 1102   K 3.7 07/10/2020 1002   K 3.9 11/06/2016 1102   CL 108 07/10/2020 1002   CO2 25 07/10/2020 1002   CO2 27 11/06/2016 1102   BUN 35 (H) 07/10/2020 1002   BUN 18.0 11/06/2016 1102   CREATININE 1.18 (H) 07/10/2020 1002   CREATININE 1.48 (H) 05/09/2020 1115   CREATININE 1.3 (H) 11/06/2016 1102      Component Value Date/Time   CALCIUM 10.6 (H) 07/10/2020 1002   CALCIUM 10.2 11/06/2016 1102   ALKPHOS 215 (H) 07/10/2020 1002   ALKPHOS 159 (H) 11/06/2016 1102   AST 91 (H) 07/10/2020 1002   AST 52 (H) 11/06/2016 1102   ALT 73 (H) 07/10/2020 1002   ALT 58 (H) 11/06/2016 1102   BILITOT 0.5 07/10/2020 1002   BILITOT 0.53 11/06/2016 1102

## 2020-07-10 NOTE — Progress Notes (Signed)
07/10/20  Maintain dose of carboplatin at 330 mg IVPB despite improved creatinine.  T.O. Dr Lesli Albee, PharmD

## 2020-07-10 NOTE — Assessment & Plan Note (Signed)
Overall, she continues to improve Even though she has lost a bit of weight, her pancytopenia is better with additional days to allow recovery and she have resolution of ascites She has persistent right lower extremity edema but not worse We will continue at previous dose reduction as before I plan to see her back again in 3-1/2 weeks for further follow-up The plan will be to proceed with 6 cycles of treatment before repeating another imaging study and with the possibility of surgery if she have good response noted on her next CT

## 2020-07-10 NOTE — Patient Instructions (Signed)
[facility] Discharge Instructions for Patients Receiving Chemotherapy  Today you received the following chemotherapy agents: paclitaxel and carboplatin.  To help prevent nausea and vomiting after your treatment, we encourage you to take your nausea medication as directed.   If you develop nausea and vomiting that is not controlled by your nausea medication, call the clinic.   BELOW ARE SYMPTOMS THAT SHOULD BE REPORTED IMMEDIATELY:  *FEVER GREATER THAN 100.5 F  *CHILLS WITH OR WITHOUT FEVER  NAUSEA AND VOMITING THAT IS NOT CONTROLLED WITH YOUR NAUSEA MEDICATION  *UNUSUAL SHORTNESS OF BREATH  *UNUSUAL BRUISING OR BLEEDING  TENDERNESS IN MOUTH AND THROAT WITH OR WITHOUT PRESENCE OF ULCERS  *URINARY PROBLEMS  *BOWEL PROBLEMS  UNUSUAL RASH Items with * indicate a potential emergency and should be followed up as soon as possible.  Feel free to call the clinic should you have any questions or concerns. The clinic phone number is [phone].  Please show the [facility] at check-in to the Emergency Department and triage nurse.   Blood Transfusion, Adult, Care After This sheet gives you information about how to care for yourself after your procedure. Your doctor may also give you more specific instructions. If you have problems or questions, contact your doctor. What can I expect after the procedure? After the procedure, it is common to have:  Bruising and soreness at the IV site.  A fever or chills on the day of the procedure. This may be your body's response to the new blood cells received.  A headache. Follow these instructions at home: Insertion site care      Follow instructions from your doctor about how to take care of your insertion site. This is where an IV tube was put into your vein. Make sure you: ? Wash your hands with soap and water before and after you change your bandage (dressing). If you cannot use soap and water, use hand  sanitizer. ? Change your bandage as told by your doctor.  Check your insertion site every day for signs of infection. Check for: ? Redness, swelling, or pain. ? Bleeding from the site. ? Warmth. ? Pus or a bad smell. General instructions  Take over-the-counter and prescription medicines only as told by your doctor.  Rest as told by your doctor.  Go back to your normal activities as told by your doctor.  Keep all follow-up visits as told by your doctor. This is important. Contact a doctor if:  You have itching or red, swollen areas of skin (hives).  You feel worried or nervous (anxious).  You feel weak after doing your normal activities.  You have redness, swelling, warmth, or pain around the insertion site.  You have blood coming from the insertion site, and the blood does not stop with pressure.  You have pus or a bad smell coming from the insertion site. Get help right away if:  You have signs of a serious reaction. This may be coming from an allergy or the body's defense system (immune system). Signs include: ? Trouble breathing or shortness of breath. ? Swelling of the face or feeling warm (flushed). ? Fever or chills. ? Head, chest, or back pain. ? Dark pee (urine) or blood in the pee. ? Widespread rash. ? Fast heartbeat. ? Feeling dizzy or light-headed. You may receive your blood transfusion in an outpatient setting. If so, you will be told whom to contact to report any reactions. These symptoms may be an emergency. Do not wait  to see if the symptoms will go away. Get medical help right away. Call your local emergency services (911 in the [place] Do not drive yourself to the hospital. Summary  Bruising and soreness at the IV site are common.  Check your insertion site every day for signs of infection.  Rest as told by your doctor. Go back to your normal activities as told by your doctor.  Get help right away if you have signs of a serious reaction. This  information is not intended to replace advice given to you by your health care provider. Make sure you discuss any questions you have with your health care provider. Document Revised: 05/12/2019 Document Reviewed: 05/12/2019 Elsevier Patient Education  [employer].

## 2020-07-10 NOTE — Assessment & Plan Note (Signed)
By spacing out her treatment, this has allowed for recovery of pancytopenia She is still anemic but not symptomatic She does not need transfusion support

## 2020-07-11 LAB — CA 125: Cancer Antigen (CA) 125: 220 U/mL — ABNORMAL HIGH (ref 0.0–38.1)

## 2020-07-12 ENCOUNTER — Telehealth: Payer: Self-pay

## 2020-07-12 NOTE — Telephone Encounter (Signed)
Returned her call from a earlier message. She is asking when she can remove blood bracelet. Instructed to remove. She verbalized understanding.

## 2020-07-14 ENCOUNTER — Encounter (HOSPITAL_COMMUNITY): Payer: Self-pay | Admitting: Emergency Medicine

## 2020-07-14 ENCOUNTER — Emergency Department (HOSPITAL_COMMUNITY)
Admission: EM | Admit: 2020-07-14 | Discharge: 2020-07-14 | Disposition: A | Discharge status: Home / Self Care | Payer: PPO | Attending: Emergency Medicine | Admitting: Emergency Medicine

## 2020-07-14 ENCOUNTER — Emergency Department (HOSPITAL_BASED_OUTPATIENT_CLINIC_OR_DEPARTMENT_OTHER): Payer: PPO

## 2020-07-14 ENCOUNTER — Other Ambulatory Visit: Payer: Self-pay

## 2020-07-14 ENCOUNTER — Emergency Department (HOSPITAL_COMMUNITY): Admission: EM | Admit: 2020-07-14 | Discharge: 2020-07-14 | Discharge status: Against Medical Advice | Payer: PPO

## 2020-07-14 DIAGNOSIS — E1169 Type 2 diabetes mellitus with other specified complication: Secondary | ICD-10-CM | POA: Diagnosis not present

## 2020-07-14 DIAGNOSIS — I129 Hypertensive chronic kidney disease with stage 1 through stage 4 chronic kidney disease, or unspecified chronic kidney disease: Secondary | ICD-10-CM | POA: Diagnosis not present

## 2020-07-14 DIAGNOSIS — M1000 Idiopathic gout, unspecified site: Secondary | ICD-10-CM | POA: Insufficient documentation

## 2020-07-14 DIAGNOSIS — N183 Chronic kidney disease, stage 3 unspecified: Secondary | ICD-10-CM | POA: Insufficient documentation

## 2020-07-14 DIAGNOSIS — E1140 Type 2 diabetes mellitus with diabetic neuropathy, unspecified: Secondary | ICD-10-CM | POA: Diagnosis not present

## 2020-07-14 DIAGNOSIS — M25562 Pain in left knee: Secondary | ICD-10-CM | POA: Insufficient documentation

## 2020-07-14 DIAGNOSIS — R609 Edema, unspecified: Secondary | ICD-10-CM | POA: Diagnosis not present

## 2020-07-14 DIAGNOSIS — E1122 Type 2 diabetes mellitus with diabetic chronic kidney disease: Secondary | ICD-10-CM | POA: Diagnosis not present

## 2020-07-14 DIAGNOSIS — E785 Hyperlipidemia, unspecified: Secondary | ICD-10-CM | POA: Insufficient documentation

## 2020-07-14 DIAGNOSIS — M25561 Pain in right knee: Secondary | ICD-10-CM | POA: Diagnosis not present

## 2020-07-14 DIAGNOSIS — Z79899 Other long term (current) drug therapy: Secondary | ICD-10-CM | POA: Insufficient documentation

## 2020-07-14 DIAGNOSIS — Z7984 Long term (current) use of oral hypoglycemic drugs: Secondary | ICD-10-CM | POA: Diagnosis not present

## 2020-07-14 NOTE — Discharge Instructions (Addendum)
Please take Tylenol as needed for knee pain please follow with your primary care physician if your symptoms continue.

## 2020-07-14 NOTE — ED Provider Notes (Signed)
[facility]PART[facility] Provider Note   CSN: [id number] Arrival date & time: 07/14/20  1225     History Chief Complaint  Patient presents with  . Leg Pain    Kayla Baker is a 84 y.o. female.   Leg Pain Location:  Knee Time since incident:  2 days Injury: no   Knee location:  L knee and R knee Pain details:    Quality:  Aching   Radiates to:  L leg and R leg   Severity:  Mild   Onset quality:  Gradual   Duration:  2 days   Timing:  Constant   Progression:  Improving Chronicity:  New Dislocation: no   Foreign body present:  No foreign bodies Tetanus status:  Unknown Prior injury to area:  No Relieved by:  Nothing Worsened by:  Nothing Ineffective treatments:  None tried Associated symptoms: no back pain and no fever        Past Medical History:  Diagnosis Date  . Allergy   . Anemia   . Arthritis   . Blood transfusion without reported diagnosis   . Breast cancer of upper-inner quadrant of left female breast ([place]) 02/29/2016   skin- 2016- squamous- on right upper arm  . GERD (gastroesophageal reflux disease)   . Gout    takes Allopurinol daily  . History of chemotherapy   . History of colon polyps    benign  . History of shingles   . Hx of radiation therapy   . Hyperlipidemia   . Hypertension   . Ovarian ca ([place]) dx'd 03/2020  . Personal history of chemotherapy    2017  . Personal history of radiation therapy    2017  . Vitamin D deficiency     Patient Active Problem List   Diagnosis Date Noted  . Protein-calorie malnutrition, moderate ([place]) 06/05/2020  . Pancytopenia, acquired ([place]) 05/15/2020  . Cancer associated pain 05/15/2020  . Malignant ascites 05/07/2020  . Anemia in neoplastic disease 05/03/2020  . Goals of care, counseling/discussion 04/20/2020  . Ovarian cancer ([place]) 04/19/2020  . Carcinomatosis ([place]) 04/17/2020  . Leg edema, right 04/05/2020  . Incontinence of urine in female 04/04/2020  . CKD stage 3  due to type 2 diabetes mellitus ([place]) 07/05/2018  . Port catheter in place 09/18/2016  . Chemotherapy-induced peripheral neuropathy ([place]) 08/07/2016  . Breast cancer of upper-inner quadrant of left female breast ([place]) 02/29/2016  . Gout 12/08/2014  . Diabetic neuropathy, painful ([place]) 08/28/2014  . Medication management 08/27/2014  . Essential hypertension 11/10/2013  . Hyperlipidemia associated with type 2 diabetes mellitus ([place]) 11/10/2013  . T2_NIDDM w/CKD3 ([place]) 11/10/2013  . Vitamin D deficiency 11/10/2013    Past Surgical History:  Procedure Laterality Date  . ABDOMINAL HYSTERECTOMY  1973  . BREAST BIOPSY Left 02/26/2016  . BREAST LUMPECTOMY Left 03/18/2016  . CARDIAC CATHETERIZATION     patient denies this procedure  . CATARACT EXTRACTION, BILATERAL  2014   right eye 2/14; left eye 3/3  . COLONOSCOPY    . HAND SURGERY Left 2012  . INCONTINENCE SURGERY  2006  . IR IMAGING GUIDED PORT INSERTION  04/24/2020  . IR PARACENTESIS  05/08/2020  . IR PARACENTESIS  05/16/2020  . IR PARACENTESIS  05/25/2020  . KNEE ARTHROSCOPY Right   . MASTOPEXY Right 06/16/2017   Procedure: RIGHT BREAST MASTOPEXY;  Surgeon: Irene Limbo, MD;  Location: [facility];  Service: Plastics;  Laterality: Right;  . PORT-A-CATH REMOVAL N/A  11/27/2016   Procedure: REMOVAL PORT-A-CATH;  Surgeon: Fanny Skates, MD;  Location: [facility];  Service: General;  Laterality: N/A;  . PORTACATH PLACEMENT N/A 04/14/2016   Procedure: INSERTION PORT-A-CATH ;  Surgeon: Fanny Skates, MD;  Location: [facility];  Service: General;  Laterality: N/A;  . RADIOACTIVE SEED GUIDED PARTIAL MASTECTOMY WITH AXILLARY SENTINEL LYMPH NODE BIOPSY Left 03/18/2016   Procedure: RADIOACTIVE SEED GUIDED LEFT PARTIAL MASTECTOMY WITH AXILLARY SENTINEL LYMPH NODE BIOPSY AND BLUE DYE INJECTION;  Surgeon: Fanny Skates, MD;  Location: [facility];  Service: General;  Laterality: Left;  . REDUCTION MAMMAPLASTY Right 2018     OB History     Gravida  0   Para  0   Term  0   Preterm  0   AB  0   Living  0     SAB  0   TAB  0   Ectopic  0   Multiple  0   Live Births  0           Family History  Problem Relation Age of Onset  . Stroke Mother 40  . Other Mother        history of hysterectomy after last childbirth  . Diabetes Father   . Alzheimer's disease Father   . Hypertension Sister   . Lung cancer Sister 8       smoker  . Other Sister        history of hysterectomy for fibroids  . Breast cancer Sister 6  . Esophageal cancer Maternal Aunt 74       not a smoker  . Heart attack Maternal Uncle 65  . Cirrhosis Sister   . Lung cancer Other        nephew dx. 59s; +smoker  . Epilepsy Other   . Other Other 12       great niece dx. benign ganglioglioma brain tumor; treated at [facility]  . Epilepsy Other        no seizures in 2 years  . Parkinson's disease Maternal Aunt   . Stroke Maternal Grandmother   . Heart Problems Maternal Grandmother   . Pancreatic cancer Cousin 92       paternal 1st cousin  . Colon cancer Neg Hx   . Stomach cancer Neg Hx   . Colon polyps Neg Hx   . Ulcerative colitis Neg Hx     Social History   Tobacco Use  . Smoking status: Never Smoker  . Smokeless tobacco: Never Used  Vaping Use  . Vaping Use: Never used  Substance Use Topics  . Alcohol use: No  . Drug use: No    Home Medications Prior to Admission medications   Medication Sig Start Date End Date Taking? Authorizing Provider  acetaminophen (TYLENOL) 325 MG tablet Take 325 mg by mouth every 6 (six) hours as needed for headache.    [provider]  allopurinol (ZYLOPRIM) 300 MG tablet Take 1/2 tablet Daily to Prevent Gout 05/09/20   Liane Comber, NP  blood glucose meter kit and supplies KIT Dispense based on patient and insurance preference. Use to check blood sugar once daily. Dx: E11.22, N18.30 05/09/20   Liane Comber, NP  Cholecalciferol (VITAMIN D3) 5000 units CAPS Take 5,000 Units by mouth daily.     [provider]  dexamethasone (DECADRON) 4 MG tablet Take 2 tabs at the night before and 2 tab the morning of chemotherapy, every 3 weeks, by mouth 04/19/20   Heath Lark, MD  doxycycline (VIBRA-TABS) 100 MG tablet Take 1 tablet (100 mg total) by mouth 2 (two) times daily. 05/31/20   Heath Lark, MD  gabapentin (NEURONTIN) 300 MG capsule Take 1 capsule 3 to 4  x /day as needed for pain Patient taking differently: Take 300 mg by mouth See admin instructions. Take 300 mg 3 times daily may take a 4th 300 mg dose as needed for pain 08/24/19   [name], MD  glimepiride (AMARYL) 1 MG tablet Take 1 tab by mouth in the morning if fasting is running 150+. Please do not take this medication if you are ill or not eating as this can cause a low blood sugar. 05/04/20   Liane Comber, NP  glucose blood (FREESTYLE TEST STRIPS) test strip Check Blood Sugar Daily as directed (Dx: E11.29) 10/28/19   [name], MD  HYDROcodone-acetaminophen (NORCO/VICODIN) 5-325 MG tablet Take 1 tablet by mouth every 6 (six) hours as needed for moderate pain. 05/24/20   Heath Lark, MD  Lancets (FREESTYLE) lancets CHECK BLOOD SUGAR DAILY AS DIRECTED 07/31/19   [name], MD  lidocaine-prilocaine (EMLA) cream Apply to affected area once Patient taking differently: Apply 1 application topically daily as needed (port access).  04/19/20   Heath Lark, MD  loratadine (CLARITIN) 10 MG tablet Take 10 mg by mouth daily as needed for allergies.    [provider]  Magnesium ([employer] MAGNESIUM) 250 MG TABS Take 250 mg by mouth 2 (two) times daily.    [provider]  metoprolol succinate (TOPROL-XL) 25 MG 24 hr tablet Take 1 tablet Daily for BP Patient taking differently: Take 25 mg by mouth daily.  09/17/19   [name], MD  mirabegron ER (MYRBETRIQ) 50 MG TB24 tablet Take 1 tablet (50 mg total) by mouth daily. 04/16/20   Liane Comber, NP  ondansetron (ZOFRAN) 8 MG tablet Take 1 tablet (8 mg  total) by mouth 2 (two) times daily as needed for refractory nausea / vomiting. Start on day 3 after carboplatin chemo. Patient not taking: Reported on 05/09/2020 04/19/20   Heath Lark, MD  prochlorperazine (COMPAZINE) 10 MG tablet Take 1 tablet (10 mg total) by mouth every 6 (six) hours as needed for nausea or vomiting. Patient not taking: Reported on 05/09/2020 05/07/20   Heath Lark, MD  vitamin C (ASCORBIC ACID) 500 MG tablet Take 500 mg by mouth every evening.     [provider]    Allergies    Keflex [cephalexin], Meloxicam, Prednisone, Premarin [estrogens conjugated], and Trovan [alatrofloxacin]  Review of Systems   Review of Systems  Constitutional: Negative for chills and fever.  HENT: Negative for ear pain and sore throat.   Eyes: Negative for pain and visual disturbance.  Respiratory: Negative for cough and shortness of breath.   Cardiovascular: Negative for chest pain and palpitations.  Gastrointestinal: Negative for abdominal pain and vomiting.  Genitourinary: Negative for dysuria and hematuria.  Musculoskeletal: Negative for arthralgias and back pain.       Bl knee pain  Skin: Negative for color change and rash.  Neurological: Negative for seizures and syncope.  All other systems reviewed and are negative.   Physical Exam Updated Vital Signs BP (!) 153/72   Pulse 93   Temp 97.7 F (36.5 C) (Oral)   Resp 16   Wt 61.2 kg   SpO2 100%   BMI 23.17 kg/m   Physical Exam Vitals and nursing note reviewed.  Constitutional:      General: She is not in  acute distress.    Appearance: She is well-developed.  HENT:     Head: Normocephalic and atraumatic.  Eyes:     Conjunctiva/sclera: Conjunctivae normal.  Cardiovascular:     Rate and Rhythm: Normal rate and regular rhythm.     Heart sounds: No murmur heard.   Pulmonary:     Effort: Pulmonary effort is normal. No respiratory distress.     Breath sounds: Normal breath sounds.  Abdominal:     Palpations:  Abdomen is soft.     Tenderness: There is no abdominal tenderness.  Musculoskeletal:        General: No deformity or signs of injury.     Cervical back: Neck supple.     Comments: Mild L knee swelling.  Full ROM in bl knees w/o tenderness.  No signs of trauma.  5/5 strength throughout bl LE.  No erythema or warmth to bl knees.    Skin:    General: Skin is warm and dry.  Neurological:     General: No focal deficit present.     Mental Status: She is alert and oriented to person, place, and time. Mental status is at baseline.     ED Results / Procedures / Treatments   Labs (all labs ordered are listed, but only abnormal results are displayed) Labs Reviewed - No data to display  EKG None  Radiology VAS [place] LOWER EXTREMITY VENOUS (DVT) (ONLY [facility] & [facility])  Result Date: 07/14/2020  Lower Venous DVTStudy Indications: Edema, and currently receiving chemotherapy treatment.  Comparison Study: No prior study Performing Technologist: Maudry Mayhew MHA, RDMS, RVT, RDCS  Examination Guidelines: A complete evaluation includes B-mode imaging, spectral Doppler, color Doppler, and power Doppler as needed of all accessible portions of each vessel. Bilateral testing is considered an integral part of a complete examination. Limited examinations for reoccurring indications may be performed as noted. The reflux portion of the exam is performed with the patient in reverse Trendelenburg.  +---------+---------------+---------+-----------+----------+--------------+ RIGHT    CompressibilityPhasicitySpontaneityPropertiesThrombus Aging +---------+---------------+---------+-----------+----------+--------------+ CFV      Full           Yes      Yes                                 +---------+---------------+---------+-----------+----------+--------------+ SFJ      Full                                                        +---------+---------------+---------+-----------+----------+--------------+ FV  Prox  Full                                                        +---------+---------------+---------+-----------+----------+--------------+ FV Mid   Full                                                        +---------+---------------+---------+-----------+----------+--------------+ FV DistalFull                                                        +---------+---------------+---------+-----------+----------+--------------+  PFV      Full                                                        +---------+---------------+---------+-----------+----------+--------------+ POP      Full           Yes      Yes                                 +---------+---------------+---------+-----------+----------+--------------+ PTV      Full                                                        +---------+---------------+---------+-----------+----------+--------------+ PERO     Full                                                        +---------+---------------+---------+-----------+----------+--------------+   +---------+---------------+---------+-----------+----------+--------------+ LEFT     CompressibilityPhasicitySpontaneityPropertiesThrombus Aging +---------+---------------+---------+-----------+----------+--------------+ CFV      Full           Yes      Yes                                 +---------+---------------+---------+-----------+----------+--------------+ SFJ      Full                                                        +---------+---------------+---------+-----------+----------+--------------+ FV Prox  Full                                                        +---------+---------------+---------+-----------+----------+--------------+ FV Mid   Full                                                        +---------+---------------+---------+-----------+----------+--------------+ FV DistalFull                                                         +---------+---------------+---------+-----------+----------+--------------+ PFV      Full                                                        +---------+---------------+---------+-----------+----------+--------------+   POP      Full           Yes      Yes                                 +---------+---------------+---------+-----------+----------+--------------+ PTV      Full                                                        +---------+---------------+---------+-----------+----------+--------------+ PERO     Full                                                        +---------+---------------+---------+-----------+----------+--------------+     Summary: RIGHT: - There is no evidence of deep vein thrombosis in the lower extremity.  - No cystic structure found in the popliteal fossa.  LEFT: - There is no evidence of deep vein thrombosis in the lower extremity.  - No cystic structure found in the popliteal fossa.  *See table(s) above for measurements and observations.    Preliminary     Procedures Procedures (including critical care time)  Medications Ordered in ED Medications - No data to display  ED Course  I have reviewed the triage vital signs and the nursing notes.  Pertinent labs & imaging results that were available during my care of the patient were reviewed by me and considered in my medical decision making (see chart for details).    MDM Rules/Calculators/A&P                          84 year old female with history of ovarian cancer currently on chemo presents with 2 days of bilateral knee pain.  Patient states that she received chemo on 8/10 and then yesterday developed bilateral knee pain.  Patient states that she developed aching knee pain that radiated to her bilateral thighs that was worse while ambulating.  States that she has a history of neuropathy in her bilateral feet however this feels different than her normal neuropathy.   Patient called her oncology nursing triage who recommended she present to the emerge department for DVT ultrasound.  Denies any injury to her knees.  States that she has mild swelling in the right leg however this is chronic due to her having ovarian cancer on the right side.  Denies fever, chills, redness, warmth of the knees.  Denies any other tingling or weakness.  Afebrile vital signs stable.  Exam as above.  No focal signs of infection or concern for septic arthritis on physical exam.  Neurovascularly intact with full range of motion of the bilateral lower extremities.  No obvious signs of trauma.  DVT ultrasound obtained in triage negative for DVTs bilaterally.  No signs of infection on physical exam.  Unclear etiology to patient's symptoms though may be arthritic in nature.  Lower concern for bony metastasis or other acute process given that this was a bilateral occurrence that affected both knees at the same time and has been improving.  At this time after discussion with the patient feel that she is  stable for discharge.  Instructed patient to take Tylenol at home if her pain were to continue to follow-up with her primary care physician.  Patient voiced agreement and understanding of this plan.  Patient was discharged in stable condition without further events.  Final Clinical Impression(s) / ED Diagnoses Final diagnoses:  Acute pain of both knees    Rx / DC Orders ED Discharge Orders    None       Silvestre Gunner, MD 07/14/20 [place]    Rex Kras Wenda Overland, MD 07/15/20 1024

## 2020-07-14 NOTE — ED Notes (Signed)
Patient verbalizes understanding of discharge instructions. Opportunity for questioning and answers were provided. Armband removed by staff, pt discharged from ED ambulatory.   

## 2020-07-14 NOTE — ED Triage Notes (Signed)
Pt states she had chemo on Tuesday.  Yesterday she started having pain to bilateral lower legs that now radiates to bilateral thighs.  Called the nurses line and was told to come to ED to R/o DVT.

## 2020-07-14 NOTE — Progress Notes (Signed)
Bilateral lower extremity venous duplex completed. Refer to "CV Proc" under chart review to view preliminary results.  07/14/2020 2:47 PM Kelby Aline., MHA, RVT, RDCS, RDMS

## 2020-07-16 ENCOUNTER — Telehealth: Payer: Self-pay

## 2020-07-16 NOTE — Telephone Encounter (Signed)
Called to follow up on ER visit and after hours call over the weekend. She is feeling better today. She has a hydrocodone Rx and did take one last night and slept better. The knee and leg pain can be a side effect of treatment. Instructed to take the Hydrocodone Rx if needed for the pain if needed and make take tylenol for mild pain. Watch out for constipation. She verbalized understanding.

## 2020-07-24 DIAGNOSIS — Z85828 Personal history of other malignant neoplasm of skin: Secondary | ICD-10-CM | POA: Diagnosis not present

## 2020-07-24 DIAGNOSIS — D225 Melanocytic nevi of trunk: Secondary | ICD-10-CM | POA: Diagnosis not present

## 2020-07-24 DIAGNOSIS — L814 Other melanin hyperpigmentation: Secondary | ICD-10-CM | POA: Diagnosis not present

## 2020-07-24 DIAGNOSIS — L821 Other seborrheic keratosis: Secondary | ICD-10-CM | POA: Diagnosis not present

## 2020-07-24 DIAGNOSIS — L578 Other skin changes due to chronic exposure to nonionizing radiation: Secondary | ICD-10-CM | POA: Diagnosis not present

## 2020-07-26 ENCOUNTER — Other Ambulatory Visit: Payer: Self-pay | Admitting: Adult Health

## 2020-08-02 MED FILL — Fosaprepitant Dimeglumine For IV Infusion 150 MG (Base Eq): INTRAVENOUS | Qty: 5 | Status: AC

## 2020-08-02 MED FILL — Dexamethasone Sodium Phosphate Inj 100 MG/10ML: INTRAMUSCULAR | Qty: 1 | Status: AC

## 2020-08-03 ENCOUNTER — Inpatient Hospital Stay: Payer: PPO | Admitting: Hematology and Oncology

## 2020-08-03 ENCOUNTER — Other Ambulatory Visit: Payer: Self-pay | Admitting: Hematology and Oncology

## 2020-08-03 ENCOUNTER — Encounter: Payer: Self-pay | Admitting: Hematology and Oncology

## 2020-08-03 ENCOUNTER — Inpatient Hospital Stay: Payer: PPO

## 2020-08-03 ENCOUNTER — Other Ambulatory Visit: Payer: Self-pay

## 2020-08-03 ENCOUNTER — Telehealth: Payer: Self-pay | Admitting: Hematology and Oncology

## 2020-08-03 ENCOUNTER — Inpatient Hospital Stay: Payer: PPO | Attending: Gynecologic Oncology

## 2020-08-03 VITALS — BP 159/73 | HR 71 | Temp 97.8°F | Resp 16

## 2020-08-03 VITALS — BP 132/71 | HR 81 | Temp 97.5°F | Resp 18 | Ht 64.0 in | Wt 136.4 lb

## 2020-08-03 DIAGNOSIS — D630 Anemia in neoplastic disease: Secondary | ICD-10-CM

## 2020-08-03 DIAGNOSIS — D649 Anemia, unspecified: Secondary | ICD-10-CM | POA: Insufficient documentation

## 2020-08-03 DIAGNOSIS — Z23 Encounter for immunization: Secondary | ICD-10-CM

## 2020-08-03 DIAGNOSIS — Z5111 Encounter for antineoplastic chemotherapy: Secondary | ICD-10-CM | POA: Insufficient documentation

## 2020-08-03 DIAGNOSIS — N183 Chronic kidney disease, stage 3 unspecified: Secondary | ICD-10-CM

## 2020-08-03 DIAGNOSIS — C569 Malignant neoplasm of unspecified ovary: Secondary | ICD-10-CM

## 2020-08-03 DIAGNOSIS — Z170 Estrogen receptor positive status [ER+]: Secondary | ICD-10-CM

## 2020-08-03 DIAGNOSIS — C561 Malignant neoplasm of right ovary: Secondary | ICD-10-CM

## 2020-08-03 DIAGNOSIS — C50212 Malignant neoplasm of upper-inner quadrant of left female breast: Secondary | ICD-10-CM

## 2020-08-03 DIAGNOSIS — Z79899 Other long term (current) drug therapy: Secondary | ICD-10-CM | POA: Diagnosis not present

## 2020-08-03 DIAGNOSIS — D61818 Other pancytopenia: Secondary | ICD-10-CM | POA: Diagnosis not present

## 2020-08-03 DIAGNOSIS — Z95828 Presence of other vascular implants and grafts: Secondary | ICD-10-CM

## 2020-08-03 DIAGNOSIS — C786 Secondary malignant neoplasm of retroperitoneum and peritoneum: Secondary | ICD-10-CM | POA: Insufficient documentation

## 2020-08-03 DIAGNOSIS — E1140 Type 2 diabetes mellitus with diabetic neuropathy, unspecified: Secondary | ICD-10-CM | POA: Diagnosis not present

## 2020-08-03 DIAGNOSIS — E1122 Type 2 diabetes mellitus with diabetic chronic kidney disease: Secondary | ICD-10-CM | POA: Insufficient documentation

## 2020-08-03 DIAGNOSIS — G893 Neoplasm related pain (acute) (chronic): Secondary | ICD-10-CM | POA: Insufficient documentation

## 2020-08-03 LAB — CMP (CANCER CENTER ONLY)
ALT: 22 U/L (ref 0–44)
AST: 35 U/L (ref 15–41)
Albumin: 3.6 g/dL (ref 3.5–5.0)
Alkaline Phosphatase: 161 U/L — ABNORMAL HIGH (ref 38–126)
Anion gap: 10 (ref 5–15)
BUN: 29 mg/dL — ABNORMAL HIGH (ref 8–23)
CO2: 24 mmol/L (ref 22–32)
Calcium: 9.5 mg/dL (ref 8.9–10.3)
Chloride: 108 mmol/L (ref 98–111)
Creatinine: 1.24 mg/dL — ABNORMAL HIGH (ref 0.44–1.00)
GFR, Est AFR Am: 47 mL/min — ABNORMAL LOW (ref >60)
GFR, Estimated: 40 mL/min — ABNORMAL LOW (ref >60)
Glucose, Bld: 187 mg/dL — ABNORMAL HIGH (ref 70–99)
Potassium: 3.7 mmol/L (ref 3.5–5.1)
Sodium: 142 mmol/L (ref 135–145)
Total Bilirubin: 0.6 mg/dL (ref 0.3–1.2)
Total Protein: 6.9 g/dL (ref 6.5–8.1)

## 2020-08-03 LAB — CBC WITH DIFFERENTIAL (CANCER CENTER ONLY)
Abs Immature Granulocytes: 0.03 10*3/uL (ref 0.00–0.07)
Basophils Absolute: 0 10*3/uL (ref 0.0–0.1)
Basophils Relative: 0 %
Eosinophils Absolute: 0 10*3/uL (ref 0.0–0.5)
Eosinophils Relative: 0 %
HCT: 24 % — ABNORMAL LOW (ref 36.0–46.0)
Hemoglobin: 7.9 g/dL — ABNORMAL LOW (ref 12.0–15.0)
Immature Granulocytes: 1 %
Lymphocytes Relative: 12 %
Lymphs Abs: 0.6 10*3/uL — ABNORMAL LOW (ref 0.7–4.0)
MCH: 32 pg (ref 26.0–34.0)
MCHC: 32.9 g/dL (ref 30.0–36.0)
MCV: 97.2 fL (ref 80.0–100.0)
Monocytes Absolute: 0.1 10*3/uL (ref 0.1–1.0)
Monocytes Relative: 2 %
Neutro Abs: 4.4 10*3/uL (ref 1.7–7.7)
Neutrophils Relative %: 85 %
Platelet Count: 104 10*3/uL — ABNORMAL LOW (ref 150–400)
RBC: 2.47 MIL/uL — ABNORMAL LOW (ref 3.87–5.11)
RDW: 24 % — ABNORMAL HIGH (ref 11.5–15.5)
WBC Count: 5.1 10*3/uL (ref 4.0–10.5)
nRBC: 0.4 % — ABNORMAL HIGH (ref 0.0–0.2)

## 2020-08-03 LAB — SAMPLE TO BLOOD BANK

## 2020-08-03 LAB — PREPARE RBC (CROSSMATCH)

## 2020-08-03 MED ORDER — FAMOTIDINE IN NACL 20-0.9 MG/50ML-% IV SOLN
20.0000 mg | Freq: Once | INTRAVENOUS | Status: AC
  Administered 2020-08-03: 20 mg via INTRAVENOUS

## 2020-08-03 MED ORDER — SODIUM CHLORIDE 0.9 % IV SOLN
10.0000 mg | Freq: Once | INTRAVENOUS | Status: AC
  Administered 2020-08-03: 10 mg via INTRAVENOUS
  Filled 2020-08-03: qty 10

## 2020-08-03 MED ORDER — PALONOSETRON HCL INJECTION 0.25 MG/5ML
0.2500 mg | Freq: Once | INTRAVENOUS | Status: AC
  Administered 2020-08-03: 0.25 mg via INTRAVENOUS

## 2020-08-03 MED ORDER — FAMOTIDINE IN NACL 20-0.9 MG/50ML-% IV SOLN
INTRAVENOUS | Status: AC
  Filled 2020-08-03: qty 50

## 2020-08-03 MED ORDER — DIPHENHYDRAMINE HCL 25 MG PO CAPS: 25.0000 mg | ORAL_CAPSULE | Freq: Once | ORAL | Status: DC

## 2020-08-03 MED ORDER — DIPHENHYDRAMINE HCL 50 MG/ML IJ SOLN
INTRAMUSCULAR | Status: AC
  Filled 2020-08-03: qty 1

## 2020-08-03 MED ORDER — ACETAMINOPHEN 325 MG PO TABS
ORAL_TABLET | ORAL | Status: AC
  Filled 2020-08-03: qty 2

## 2020-08-03 MED ORDER — SODIUM CHLORIDE 0.9% IV SOLUTION
250.0000 mL | Freq: Once | INTRAVENOUS | Status: AC
  Administered 2020-08-03: 250 mL via INTRAVENOUS
  Filled 2020-08-03: qty 250

## 2020-08-03 MED ORDER — SODIUM CHLORIDE 0.9 % IV SOLN
Freq: Once | INTRAVENOUS | Status: AC
  Filled 2020-08-03: qty 250

## 2020-08-03 MED ORDER — SODIUM CHLORIDE 0.9 % IV SOLN
150.0000 mg | Freq: Once | INTRAVENOUS | Status: AC
  Administered 2020-08-03: 150 mg via INTRAVENOUS
  Filled 2020-08-03: qty 150

## 2020-08-03 MED ORDER — SODIUM CHLORIDE 0.9 % IV SOLN
300.0000 mg | Freq: Once | INTRAVENOUS | Status: AC
  Administered 2020-08-03: 300 mg via INTRAVENOUS
  Filled 2020-08-03: qty 30

## 2020-08-03 MED ORDER — HEPARIN SOD (PORK) LOCK FLUSH 100 UNIT/ML IV SOLN
500.0000 [IU] | Freq: Once | INTRAVENOUS | Status: AC | PRN
  Administered 2020-08-03: 500 [IU]
  Filled 2020-08-03: qty 5

## 2020-08-03 MED ORDER — DIPHENHYDRAMINE HCL 50 MG/ML IJ SOLN
50.0000 mg | Freq: Once | INTRAMUSCULAR | Status: AC
  Administered 2020-08-03: 50 mg via INTRAVENOUS

## 2020-08-03 MED ORDER — SODIUM CHLORIDE 0.9% FLUSH
10.0000 mL | Freq: Once | INTRAVENOUS | Status: AC
  Administered 2020-08-03: 10 mL
  Filled 2020-08-03: qty 10

## 2020-08-03 MED ORDER — PALONOSETRON HCL INJECTION 0.25 MG/5ML
INTRAVENOUS | Status: AC
  Filled 2020-08-03: qty 5

## 2020-08-03 MED ORDER — ACETAMINOPHEN 325 MG PO TABS
650.0000 mg | ORAL_TABLET | Freq: Once | ORAL | Status: AC
  Administered 2020-08-03: 650 mg via ORAL

## 2020-08-03 MED ORDER — SODIUM CHLORIDE 0.9 % IV SOLN
228.0000 mg | Freq: Once | INTRAVENOUS | Status: AC
  Administered 2020-08-03: 228 mg via INTRAVENOUS
  Filled 2020-08-03: qty 38

## 2020-08-03 MED ORDER — SODIUM CHLORIDE 0.9% FLUSH
10.0000 mL | INTRAVENOUS | Status: DC | PRN
  Administered 2020-08-03: 10 mL
  Filled 2020-08-03: qty 10

## 2020-08-03 NOTE — Patient Instructions (Addendum)
[facility] Discharge Instructions for Patients Receiving Chemotherapy  Today you received the following chemotherapy agents: Paclitaxel (Taxol) and Carboplatin.  To help prevent nausea and vomiting after your treatment, we encourage you to take your nausea medication as directed by your MD.   If you develop nausea and vomiting that is not controlled by your nausea medication, call the clinic.   BELOW ARE SYMPTOMS THAT SHOULD BE REPORTED IMMEDIATELY:  *FEVER GREATER THAN 100.5 F  *CHILLS WITH OR WITHOUT FEVER  NAUSEA AND VOMITING THAT IS NOT CONTROLLED WITH YOUR NAUSEA MEDICATION  *UNUSUAL SHORTNESS OF BREATH  *UNUSUAL BRUISING OR BLEEDING  TENDERNESS IN MOUTH AND THROAT WITH OR WITHOUT PRESENCE OF ULCERS  *URINARY PROBLEMS  *BOWEL PROBLEMS  UNUSUAL RASH Items with * indicate a potential emergency and should be followed up as soon as possible.  Feel free to call the clinic should you have any questions or concerns. The clinic phone number is [phone].  Please show the [facility] at check-in to the Emergency Department and triage nurse.   Blood Transfusion, Adult A blood transfusion is a procedure in which you receive blood through an IV tube. You may need this procedure because of:  A bleeding disorder.  An illness.  An injury.  A surgery. The blood may come from someone else (a donor). You may also be able to donate blood for yourself. The blood given in a transfusion is made up of different types of cells. You may get:  Red blood cells. These carry oxygen to the cells in the body.  White blood cells. These help you fight infections.  Platelets. These help your blood to clot.  Plasma. This is the liquid part of your blood. It carries proteins and other substances through the body. If you have a clotting disorder, you may also get other types of blood products. Tell your doctor about:  Any blood disorders you have.  Any reactions  you have had during a blood transfusion in the past.  Any allergies you have.  All medicines you are taking, including vitamins, herbs, eye drops, creams, and over-the-counter medicines.  Any surgeries you have had.  Any medical conditions you have. This includes any recent fever or cold symptoms.  Whether you are pregnant or may be pregnant. What are the risks? Generally, this is a safe procedure. However, problems may occur.  The most common problems include: ? A mild allergic reaction. This includes red, swollen areas of skin (hives) and itching. ? Fever or chills. This may be the body's response to new blood cells received. This may happen during or up to 4 hours after the transfusion.  More serious problems may include: ? Too much fluid in the lungs. This may cause breathing problems. ? A serious allergic reaction. This includes breathing trouble or swelling around the face and lips. ? Lung injury. This causes breathing trouble and low oxygen in the blood. This can happen within hours of the transfusion or days later. ? Too much iron. This can happen after getting many blood transfusions over a period of time. ? An infection or virus passed through the blood. This is rare. Donated blood is carefully tested before it is given. ? Your body's defense system (immune system) trying to attack the new blood cells. This is rare. Symptoms may include fever, chills, nausea, low blood pressure, and low back or chest pain. ? Donated cells attacking healthy tissues. This is rare. What happens before the procedure? Medicines  Ask your doctor about:  Changing or stopping your normal medicines. This is important.  Taking aspirin and ibuprofen. Do not take these medicines unless your doctor tells you to take them.  Taking over-the-counter medicines, vitamins, herbs, and supplements. General instructions  Follow instructions from your doctor about what you cannot eat or drink.  You will have  a blood test to find out your blood type. The test also finds out what type of blood your body will accept and matches it to the donor type.  If you are going to have a planned surgery, you may be able to donate your own blood. This may be done in case you need a transfusion.  You will have your temperature, blood pressure, and pulse checked.  You may receive medicine to help prevent an allergic reaction. This may be done if you have had a reaction to a transfusion before. This medicine may be given to you by mouth or through an IV tube.  This procedure lasts about 1-4 hours. Plan for the time you need. What happens during the procedure?   An IV tube will be put into one of your veins.  The bag of donated blood will be attached to your IV tube. Then, the blood will enter through your vein.  Your temperature, blood pressure, and pulse will be checked often. This is done to find early signs of a transfusion reaction.  Tell your nurse right away if you have any of these symptoms: ? Shortness of breath or trouble breathing. ? Chest or back pain. ? Fever or chills. ? Red, swollen areas of skin or itching.  If you have any signs or symptoms of a reaction, your transfusion will be stopped. You may also be given medicine.  When the transfusion is finished, your IV tube will be taken out.  Pressure may be put on the IV site for a few minutes.  A bandage (dressing) will be put on the IV site. The procedure may vary among doctors and hospitals. What happens after the procedure?  You will be monitored until you leave the hospital or clinic. This includes checking your temperature, blood pressure, pulse, breathing rate, and blood oxygen level.  Your blood may be tested to see how you are responding to the transfusion.  You may be warmed with fluids or blankets. This is done to keep the temperature of your body normal.  If you have your procedure in an outpatient setting, you will be told  whom to contact to report any reactions. Where to find more information To learn more, visit the [employer]: redcross.org Summary  A blood transfusion is a procedure in which you are given blood through an IV tube.  The blood may come from someone else (a donor). You may also be able to donate blood for yourself.  The blood you are given is made up of different blood cells. You may receive red blood cells, platelets, plasma, or white blood cells.  Your temperature, blood pressure, and pulse will be checked often.  After the procedure, your blood may be tested to see how you are responding. This information is not intended to replace advice given to you by your health care provider. Make sure you discuss any questions you have with your health care provider. Document Revised: 05/12/2019 Document Reviewed: 05/12/2019 Elsevier Patient Education  [employer] [place].

## 2020-08-03 NOTE — Assessment & Plan Note (Signed)
She has significant fluctuation of her serum creatinine I will adjust the dose of chemotherapy accordingly

## 2020-08-03 NOTE — Assessment & Plan Note (Signed)
She will continue to take pain medicine as needed

## 2020-08-03 NOTE — Assessment & Plan Note (Signed)
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

## 2020-08-03 NOTE — Progress Notes (Signed)
Per Dr. [name], ok for treatment today with HGB 7.9

## 2020-08-03 NOTE — Assessment & Plan Note (Signed)
She has multiple side effects of treatment despite significant dose adjustment She is quite anemic today I recommend we proceed with treatment along with transfusion support I plan to see her a day before next treatment just in case we need transfusion support again I plan to reduce the dose of treatment further

## 2020-08-03 NOTE — Progress Notes (Signed)
   Covid-19 Vaccination Clinic  Name:  Kayla Baker    MRN: [id number] DOB: 01/28/1936  08/03/2020  Kayla Baker was observed post Covid-19 immunization for 15 minutes without incident. She was provided with Vaccine Information Sheet and instruction to access the V-Safe system.   Kayla Baker was instructed to call 911 with any severe reactions post vaccine: Marland Kitchen Difficulty breathing  . Swelling of face and throat  . A fast heartbeat  . A bad rash all over body  . Dizziness and weakness

## 2020-08-03 NOTE — Telephone Encounter (Signed)
Scheduled appts per 9/3 sch msg. Pt will get AVS in infusion.

## 2020-08-03 NOTE — Progress Notes (Signed)
[place] OFFICE PROGRESS NOTE  Patient Care Team: [name], MD as PCP - Kinnie Scales, OD as Referring Physician (Optometry) [name], MD as Consulting Physician (Dermatology) Latanya Maudlin, MD as Consulting Physician (Orthopedic Surgery) Inda Castle, MD (Inactive) as Consulting Physician (Gastroenterology) Awanda Mink Craige Cotta, RN as Oncology Nurse Navigator (Oncology) Heath Lark, MD as Consulting Physician (Hematology and Oncology)  ASSESSMENT & PLAN:  Ovarian cancer [facility]) She has multiple side effects of treatment despite significant dose adjustment She is quite anemic today I recommend we proceed with treatment along with transfusion support I plan to see her a day before next treatment just in case we need transfusion support again I plan to reduce the dose of treatment further  Pancytopenia, acquired [facility]) She has acquired pancytopenia due to treatment In addition to reducing the dose of therapy, I plan to give her a unit of blood today  We discussed some of the risks, benefits, and alternatives of blood transfusions. The patient is symptomatic from anemia and the hemoglobin level is critically low.  Some of the side-effects to be expected including risks of transfusion reactions, chills, infection, syndrome of volume overload and risk of hospitalization from various reasons and the patient is willing to proceed and went ahead to sign consent today.   CKD stage 3 due to type 2 diabetes mellitus ([place]) She has significant fluctuation of her serum creatinine I will adjust the dose of chemotherapy accordingly  Diabetic neuropathy, painful (HCC) Her neuropathy is persistent She will continue gabapentin and reduced dose Taxol  Cancer associated pain She will continue to take pain medicine as needed   Orders Placed This Encounter  Procedures   Informed Consent Details: Physician/Practitioner Attestation; Transcribe to consent form and obtain  patient signature    Standing Status:   Future    Standing Expiration Date:   08/03/2021    Order Specific Question:   Physician/Practitioner attestation of informed consent for blood and or blood product transfusion    Answer:   I, the physician/practitioner, attest that I have discussed with the patient the benefits, risks, side effects, alternatives, likelihood of achieving goals and potential problems during recovery for the procedure that I have provided informed consent.    Order Specific Question:   Product(s)    Answer:   All Product(s)   Care order/instruction    Transfuse Parameters    Standing Status:   Future    Standing Expiration Date:   08/03/2021   Type and screen    Standing Status:   Future    Number of Occurrences:   1    Standing Expiration Date:   08/03/2021    All questions were answered. The patient knows to call the clinic with any problems, questions or concerns. The total time spent in the appointment was 40 minutes encounter with patients including review of chart and various tests results, discussions about plan of care and coordination of care plan   Heath Lark, MD 08/03/2020 3:19 PM  INTERVAL HISTORY: Please see below for problem oriented charting. She returns with her husband for further follow-up, to be seen prior to cycle 5 of chemotherapy She feels well except for recent emergency room visit She is complaining of lower extremity pain and swelling and was directed to the emergency department for evaluation Venous Doppler ultrasound was negative She took some pain medicine as needed No recent nausea or constipation Denies abdominal bloating Denies worsening peripheral neuropathy The patient denies any recent signs  or symptoms of bleeding such as spontaneous epistaxis, hematuria or hematochezia.   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[place])  02/26/2016 Initial Diagnosis   Left breast  biopsy 11:00 position: invasive ductal carcinoma with DCIS, ER 90%, PR 10%, HER-2 negative, Ki-67 30%, grade 2, 2.2 cm palpable lesion T2 N0 stage II a clinical stage   03/18/2016 Surgery   Left lumpectomy: Invasive ductal carcinoma, grade 2, 6.3 cm, with high-grade DCIS, margins negative, 0/4 lymph nodes negative, ER 90%, via 10%, HER-2 negative ratio 0.97, Ki-67 30%, T3 N0 stage IIB   03/25/2016 Procedure   Genetic testing is negative for pathogenic mutations within any of the 20 Genes on the breast/ovarian cancer panel   04/04/2016 Oncotype testing   Oncotype DX recurrence score 37, 25% 10 year distant risk of recurrence   04/17/2016 - 07/31/2016 Chemotherapy   Adjuvant chemotherapy with dose dense Adriamycin and Cytoxan followed by Abraxane weekly 8 ( discontinued due to neuropathy)   09/01/2016 - 09/26/2016 Radiation Therapy   Adj XRT 1) Left breast: 42.5 Gy in 17 fractions. 2) Left breast boost: 7.5 Gy in 3 fractions.   12/02/2016 -  Anti-estrogen oral therapy   Anastrozole 1 mg daily   Ovarian cancer ([place])  04/03/2020 Imaging   US pelvis Complex cystic and solid mass in LEFT adnexa 12.5 cm diameter question cystic ovarian neoplasm; recommend correlation with serum tumor markers and further evaluation by MR imaging with and without contrast.     04/04/2020 Imaging   US venous Doppler No evidence of deep venous thrombosis in the right lower extremity. Left common femoral vein also patent.   04/15/2020 Imaging   MRI pelvis 1. Large complex solid and cystic mass arising from the right adnexa measuring 10.8 by 11.5 by 11.1 cm. This has an aggressive appearance with extensive enhancing mural soft tissue components. Findings are highly suspicious for malignant ovarian neoplasm. 2. Extensive bilateral retroperitoneal and bilateral iliac adenopathy compatible with metastatic disease. 3. Signs of extensive peritoneal carcinomatosis including ascites, enhancement, thickening and nodularity of the  peritoneal reflections, omental caking and bulky peritoneal nodularity. 4. Suspected serosal involvement of the dome of bladder with loss of normal fat plane. Cannot rule out mural invasion by tumor.   04/17/2020 Tumor Marker   Patient's tumor was tested for the following markers: CA-125 Results of the tumor marker test revealed 1762.   04/20/2020 Cancer Staging   Staging form: Ovary, Fallopian Tube, and Primary Peritoneal Carcinoma, AJCC 8th Edition - Clinical: FIGO Stage IIIC (cT3, cN1, cM0) - Signed by Heath Lark, MD on 04/20/2020   04/23/2020 Imaging   1. Redemonstrated dominant mixed solid and cystic mass arising from the vicinity of the right ovary measuring at least 11.5 x 10.7 cm, not significantly changed compared to prior MR, and consistent with primary ovarian malignancy.  2. Numerous bulky retroperitoneal, bilateral iliac, and pelvic sidewall lymph nodes. 3. Moderate volume ascites throughout the abdomen and pelvis with subtle thickening and nodularity throughout the peritoneum, and extensive bulky nodular metastatic disease of the omentum. 4. Constellation of findings is consistent with advanced nodal and peritoneal metastatic disease. 5. There are prominent subcentimeter epicardial lymph nodes, nonspecific although suspicious for nodal metastatic disease. No definite nodal metastatic disease in the chest. Attention on follow-up. 6. There are multiple small subpleural nodules at the right lung base overlying the diaphragm, measuring up to 7 mm. These are generally nonspecific and less favored to represent pulmonary  metastatic disease given distribution. Attention on follow-up. 7. Somewhat coarse contour of the liver, suggestive of cirrhosis, although out without overt morphologic stigmata. 8. Aortic Atherosclerosis (ICD10-I70.0).     04/24/2020 Procedure   Successful placement of a right internal jugular approach power injectable Port-A-Cath. The catheter is ready for immediate  use.     05/02/2020 Tumor Marker   Patient's tumor was tested for the following markers: CA-125 Results of the tumor marker test revealed 1921   05/03/2020 -  Chemotherapy   The patient had carboplatin and taxol for chemotherapy treatment.     05/08/2020 Procedure   Successful ultrasound-guided paracentesis yielding 2.6 liters of peritoneal fluid.   05/16/2020 Procedure   Successful ultrasound-guided paracentesis yielding 3.2 liters of peritoneal fluid   05/25/2020 Procedure   Successful ultrasound-guided paracentesis yielding 3 liters of peritoneal fluid.     06/01/2020 Procedure   Successful ultrasound-guided therapeutic paracentesis yielding 1.7 liters of peritoneal fluid.   06/14/2020 Tumor Marker   Patient's tumor was tested for the following markers: CA-125 Results of the tumor marker test revealed 1309   06/20/2020 Imaging   1. Dominant mixed cystic and solid mass in the central pelvis is stable in the interval. 2. Clear interval decrease in retroperitoneal and pelvic sidewall lymphadenopathy. The bulky omental disease has also clearly decreased in the interval. 3. Interval decrease in ascites. 4. Stable 8 mm subpleural nodule along the diaphragm. 5. Aortic Atherosclerosis (ICD10-I70.0).   07/10/2020 Tumor Marker   Patient's tumor was tested for the following markers: CA-125 Results of the tumor marker test revealed 220     REVIEW OF SYSTEMS:   Constitutional: Denies fevers, chills or abnormal weight loss Eyes: Denies blurriness of vision Ears, nose, mouth, throat, and face: Denies mucositis or sore throat Respiratory: Denies cough, dyspnea or wheezes Cardiovascular: Denies palpitation, chest discomfort or lower extremity swelling Gastrointestinal:  Denies nausea, heartburn or change in bowel habits Skin: Denies abnormal skin rashes Lymphatics: Denies new lymphadenopathy or easy bruising Behavioral/Psych: Mood is stable, no new changes  All other systems were reviewed  with the patient and are negative.  I have reviewed the past medical history, past surgical history, social history and family history with the patient and they are unchanged from previous note.  ALLERGIES:  is allergic to keflex [cephalexin], meloxicam, prednisone, premarin [estrogens conjugated], and trovan [alatrofloxacin].  MEDICATIONS:  Current Outpatient Medications  Medication Sig Dispense Refill   acetaminophen (TYLENOL) 325 MG tablet Take 325 mg by mouth every 6 (six) hours as needed for headache.     allopurinol (ZYLOPRIM) 300 MG tablet Take 1/2 tablet Daily to Prevent Gout 90 tablet 3   blood glucose meter kit and supplies KIT Dispense based on patient and insurance preference. Use to check blood sugar once daily. Dx: E11.22, N18.30 1 each 12   Cholecalciferol (VITAMIN D3) 5000 units CAPS Take 5,000 Units by mouth daily.     dexamethasone (DECADRON) 4 MG tablet Take 2 tabs at the night before and 2 tab the morning of chemotherapy, every 3 weeks, by mouth 36 tablet 5   doxycycline (VIBRA-TABS) 100 MG tablet Take 1 tablet (100 mg total) by mouth 2 (two) times daily. 14 tablet 0   gabapentin (NEURONTIN) 300 MG capsule Take 1 capsule 3 to 4  x /day as needed for pain (Patient taking differently: Take 300 mg by mouth See admin instructions. Take 300 mg 3 times daily may take a 4th 300 mg dose as needed for pain) 360 capsule 3  glimepiride (AMARYL) 1 MG tablet Take 1 tab by mouth in the morning if fasting is running 150+. Please do not take this medication if you are ill or not eating as this can cause a low blood sugar. 90 tablet 1   glucose blood (FREESTYLE TEST STRIPS) test strip Check Blood Sugar Daily as directed (Dx: E11.29) 100 strip 3   HYDROcodone-acetaminophen (NORCO/VICODIN) 5-325 MG tablet Take 1 tablet by mouth every 6 (six) hours as needed for moderate pain. 30 tablet 0   Lancets (FREESTYLE) lancets CHECK BLOOD SUGAR DAILY AS DIRECTED 100 each 3    lidocaine-prilocaine (EMLA) cream Apply to affected area once (Patient taking differently: Apply 1 application topically daily as needed (port access). ) 30 g 3   loratadine (CLARITIN) 10 MG tablet Take 10 mg by mouth daily as needed for allergies.     Magnesium ([employer] MAGNESIUM) 250 MG TABS Take 250 mg by mouth 2 (two) times daily.     metoprolol succinate (TOPROL-XL) 25 MG 24 hr tablet Take 1 tablet Daily for BP (Patient taking differently: Take 25 mg by mouth daily. ) 90 tablet 3   MYRBETRIQ 50 MG TB24 tablet TAKE 1 TABLET(50 MG) BY MOUTH DAILY 90 tablet q   ondansetron (ZOFRAN) 8 MG tablet Take 1 tablet (8 mg total) by mouth 2 (two) times daily as needed for refractory nausea / vomiting. Start on day 3 after carboplatin chemo. (Patient not taking: Reported on 05/09/2020) 30 tablet 1   prochlorperazine (COMPAZINE) 10 MG tablet Take 1 tablet (10 mg total) by mouth every 6 (six) hours as needed for nausea or vomiting. (Patient not taking: Reported on 05/09/2020) 60 tablet 9   vitamin C (ASCORBIC ACID) 500 MG tablet Take 500 mg by mouth every evening.      No current facility-administered medications for this visit.   Facility-Administered Medications Ordered in Other Visits  Medication Dose Route Frequency Provider Last Rate Last Admin   0.9 %  sodium chloride infusion (Manually program via Guardrails IV Fluids)  250 mL Intravenous Once [name], , MD       CARBOplatin (PARAPLATIN) 300 mg in sodium chloride 0.9 % 100 mL chemo infusion  300 mg Intravenous Once [name], , MD       diphenhydrAMINE (BENADRYL) capsule 25 mg  25 mg Oral Once [name], , MD       heparin lock flush 100 unit/mL  500 Units Intracatheter Once PRN [name], , MD       PACLitaxel (TAXOL) 228 mg in sodium chloride 0.9 % 250 mL chemo infusion (> 53m/m2)  228 mg Intravenous Once GAlvy Bimler , MD 96 mL/hr at 08/03/20 1223 228 mg at 08/03/20 1223   sodium chloride flush (NS) 0.9 % injection 10 mL  10 mL Intracatheter  PRN GHeath Lark MD        PHYSICAL EXAMINATION: ECOG PERFORMANCE STATUS: 2 - Symptomatic, <50% confined to bed  Vitals:   08/03/20 0927  BP: 132/71  Pulse: 81  Resp: 18  Temp: (!) 97.5 F (36.4 C)  SpO2: 100%   Filed Weights   08/03/20 0927  Weight: 136 lb 6.4 oz (61.9 kg)    GENERAL:alert, no distress and comfortable.  She looks pale SKIN: skin color, texture, turgor are normal, no rashes or significant lesions EYES: normal, Conjunctiva are pink and non-injected, sclera clear OROPHARYNX:no exudate, no erythema and lips, buccal mucosa, and tongue normal  NECK: supple, thyroid normal size, non-tender, without nodularity LYMPH:  no palpable lymphadenopathy in the  cervical, axillary or inguinal LUNGS: clear to auscultation and percussion with normal breathing effort HEART: regular rate & rhythm and no murmurs and no lower extremity edema ABDOMEN:abdomen soft, non-tender and normal bowel sounds Musculoskeletal:no cyanosis of digits and no clubbing  NEURO: alert & oriented x 3 with fluent speech, no focal motor/sensory deficits  LABORATORY DATA:  I have reviewed the data as listed    Component Value Date/Time   NA 142 08/03/2020 0858   NA 141 11/06/2016 1102   K 3.7 08/03/2020 0858   K 3.9 11/06/2016 1102   CL 108 08/03/2020 0858   CO2 24 08/03/2020 0858   CO2 27 11/06/2016 1102   GLUCOSE 187 (H) 08/03/2020 0858   GLUCOSE 130 11/06/2016 1102   BUN 29 (H) 08/03/2020 0858   BUN 18.0 11/06/2016 1102   CREATININE 1.24 (H) 08/03/2020 0858   CREATININE 1.48 (H) 05/09/2020 1115   CREATININE 1.3 (H) 11/06/2016 1102   CALCIUM 9.5 08/03/2020 0858   CALCIUM 10.2 11/06/2016 1102   PROT 6.9 08/03/2020 0858   PROT 7.1 11/06/2016 1102   ALBUMIN 3.6 08/03/2020 0858   ALBUMIN 3.7 11/06/2016 1102   AST 35 08/03/2020 0858   AST 52 (H) 11/06/2016 1102   ALT 22 08/03/2020 0858   ALT 58 (H) 11/06/2016 1102   ALKPHOS 161 (H) 08/03/2020 0858   ALKPHOS 159 (H) 11/06/2016 1102    BILITOT 0.6 08/03/2020 0858   BILITOT 0.53 11/06/2016 1102   GFRNONAA 40 (L) 08/03/2020 0858   GFRNONAA 33 (L) 05/09/2020 1115   GFRAA 47 (L) 08/03/2020 0858   GFRAA 38 (L) 05/09/2020 1115    No results found for: SPEP, UPEP  Lab Results  Component Value Date   WBC 5.1 08/03/2020   NEUTROABS 4.4 08/03/2020   HGB 7.9 (L) 08/03/2020   HCT 24.0 (L) 08/03/2020   MCV 97.2 08/03/2020   PLT 104 (L) 08/03/2020      Chemistry      Component Value Date/Time   NA 142 08/03/2020 0858   NA 141 11/06/2016 1102   K 3.7 08/03/2020 0858   K 3.9 11/06/2016 1102   CL 108 08/03/2020 0858   CO2 24 08/03/2020 0858   CO2 27 11/06/2016 1102   BUN 29 (H) 08/03/2020 0858   BUN 18.0 11/06/2016 1102   CREATININE 1.24 (H) 08/03/2020 0858   CREATININE 1.48 (H) 05/09/2020 1115   CREATININE 1.3 (H) 11/06/2016 1102      Component Value Date/Time   CALCIUM 9.5 08/03/2020 0858   CALCIUM 10.2 11/06/2016 1102   ALKPHOS 161 (H) 08/03/2020 0858   ALKPHOS 159 (H) 11/06/2016 1102   AST 35 08/03/2020 0858   AST 52 (H) 11/06/2016 1102   ALT 22 08/03/2020 0858   ALT 58 (H) 11/06/2016 1102   BILITOT 0.6 08/03/2020 0858   BILITOT 0.53 11/06/2016 1102       RADIOGRAPHIC STUDIES: I have personally reviewed the radiological images as listed and agreed with the findings in the report. VAS [place] LOWER EXTREMITY VENOUS (DVT) (ONLY [facility] & [facility])  Result Date: 07/15/2020  Lower Venous DVTStudy Indications: Edema, and currently receiving chemotherapy treatment.  Comparison Study: No prior study Performing Technologist: Maudry Mayhew MHA, RDMS, RVT, RDCS  Examination Guidelines: A complete evaluation includes B-mode imaging, spectral Doppler, color Doppler, and power Doppler as needed of all accessible portions of each vessel. Bilateral testing is considered an integral part of a complete examination. Limited examinations for reoccurring indications may be performed as noted. The  reflux portion of the exam is performed  with the patient in reverse Trendelenburg.  +---------+---------------+---------+-----------+----------+--------------+  RIGHT     Compressibility Phasicity Spontaneity Properties Thrombus Aging  +---------+---------------+---------+-----------+----------+--------------+  CFV       Full            Yes       Yes                                    +---------+---------------+---------+-----------+----------+--------------+  SFJ       Full                                                             +---------+---------------+---------+-----------+----------+--------------+  FV Prox   Full                                                             +---------+---------------+---------+-----------+----------+--------------+  FV Mid    Full                                                             +---------+---------------+---------+-----------+----------+--------------+  FV Distal Full                                                             +---------+---------------+---------+-----------+----------+--------------+  PFV       Full                                                             +---------+---------------+---------+-----------+----------+--------------+  POP       Full            Yes       Yes                                    +---------+---------------+---------+-----------+----------+--------------+  PTV       Full                                                             +---------+---------------+---------+-----------+----------+--------------+  PERO      Full                                                             +---------+---------------+---------+-----------+----------+--------------+   +---------+---------------+---------+-----------+----------+--------------+  LEFT      Compressibility Phasicity Spontaneity Properties Thrombus Aging  +---------+---------------+---------+-----------+----------+--------------+  CFV       Full            Yes       Yes                                     +---------+---------------+---------+-----------+----------+--------------+  SFJ       Full                                                             +---------+---------------+---------+-----------+----------+--------------+  FV Prox   Full                                                             +---------+---------------+---------+-----------+----------+--------------+  FV Mid    Full                                                             +---------+---------------+---------+-----------+----------+--------------+  FV Distal Full                                                             +---------+---------------+---------+-----------+----------+--------------+  PFV       Full                                                             +---------+---------------+---------+-----------+----------+--------------+  POP       Full            Yes       Yes                                    +---------+---------------+---------+-----------+----------+--------------+  PTV       Full                                                             +---------+---------------+---------+-----------+----------+--------------+  PERO      Full                                                             +---------+---------------+---------+-----------+----------+--------------+  Summary: RIGHT: - There is no evidence of deep vein thrombosis in the lower extremity.  - No cystic structure found in the popliteal fossa.  LEFT: - There is no evidence of deep vein thrombosis in the lower extremity.  - No cystic structure found in the popliteal fossa.  *See table(s) above for measurements and observations. Electronically signed by Ruta Hinds MD on 07/15/2020 at 12:29:06 PM.    Final

## 2020-08-03 NOTE — Assessment & Plan Note (Signed)
She has acquired pancytopenia due to treatment In addition to reducing the dose of therapy, I plan to give her a unit of blood today  We discussed some of the risks, benefits, and alternatives of blood transfusions. The patient is symptomatic from anemia and the hemoglobin level is critically low.  Some of the side-effects to be expected including risks of transfusion reactions, chills, infection, syndrome of volume overload and risk of hospitalization from various reasons and the patient is willing to proceed and went ahead to sign consent today.

## 2020-08-04 LAB — TYPE AND SCREEN
ABO/RH(D): A POS
Antibody Screen: NEGATIVE
Unit division: 0

## 2020-08-04 LAB — BPAM RBC
Blood Product Expiration Date: 202109192359
ISSUE DATE / TIME: 202109031410
Unit Type and Rh: 6200

## 2020-08-04 LAB — CA 125: Cancer Antigen (CA) 125: 53.3 U/mL — ABNORMAL HIGH (ref 0.0–38.1)

## 2020-08-10 ENCOUNTER — Ambulatory Visit: Payer: PPO | Admitting: Adult Health

## 2020-08-23 ENCOUNTER — Inpatient Hospital Stay: Payer: PPO | Admitting: Hematology and Oncology

## 2020-08-23 ENCOUNTER — Encounter: Payer: Self-pay | Admitting: Hematology and Oncology

## 2020-08-23 ENCOUNTER — Other Ambulatory Visit: Payer: Self-pay

## 2020-08-23 ENCOUNTER — Inpatient Hospital Stay: Payer: PPO

## 2020-08-23 ENCOUNTER — Telehealth: Payer: Self-pay | Admitting: Hematology and Oncology

## 2020-08-23 VITALS — BP 124/64 | HR 94 | Temp 98.1°F | Resp 18 | Ht 64.0 in | Wt 135.6 lb

## 2020-08-23 DIAGNOSIS — N183 Chronic kidney disease, stage 3 unspecified: Secondary | ICD-10-CM

## 2020-08-23 DIAGNOSIS — C561 Malignant neoplasm of right ovary: Secondary | ICD-10-CM

## 2020-08-23 DIAGNOSIS — D630 Anemia in neoplastic disease: Secondary | ICD-10-CM

## 2020-08-23 DIAGNOSIS — T451X5A Adverse effect of antineoplastic and immunosuppressive drugs, initial encounter: Secondary | ICD-10-CM | POA: Diagnosis not present

## 2020-08-23 DIAGNOSIS — C569 Malignant neoplasm of unspecified ovary: Secondary | ICD-10-CM

## 2020-08-23 DIAGNOSIS — G620 Drug-induced polyneuropathy: Secondary | ICD-10-CM

## 2020-08-23 DIAGNOSIS — D61818 Other pancytopenia: Secondary | ICD-10-CM | POA: Diagnosis not present

## 2020-08-23 DIAGNOSIS — Z5111 Encounter for antineoplastic chemotherapy: Secondary | ICD-10-CM | POA: Diagnosis not present

## 2020-08-23 DIAGNOSIS — Z170 Estrogen receptor positive status [ER+]: Secondary | ICD-10-CM

## 2020-08-23 DIAGNOSIS — C50212 Malignant neoplasm of upper-inner quadrant of left female breast: Secondary | ICD-10-CM

## 2020-08-23 DIAGNOSIS — E1122 Type 2 diabetes mellitus with diabetic chronic kidney disease: Secondary | ICD-10-CM

## 2020-08-23 DIAGNOSIS — Z95828 Presence of other vascular implants and grafts: Secondary | ICD-10-CM

## 2020-08-23 LAB — CBC WITH DIFFERENTIAL (CANCER CENTER ONLY)
Abs Immature Granulocytes: 0.03 10*3/uL (ref 0.00–0.07)
Basophils Absolute: 0 10*3/uL (ref 0.0–0.1)
Basophils Relative: 0 %
Eosinophils Absolute: 0 10*3/uL (ref 0.0–0.5)
Eosinophils Relative: 0 %
HCT: 25.2 % — ABNORMAL LOW (ref 36.0–46.0)
Hemoglobin: 8.4 g/dL — ABNORMAL LOW (ref 12.0–15.0)
Immature Granulocytes: 1 %
Lymphocytes Relative: 24 %
Lymphs Abs: 1 10*3/uL (ref 0.7–4.0)
MCH: 33.1 pg (ref 26.0–34.0)
MCHC: 33.3 g/dL (ref 30.0–36.0)
MCV: 99.2 fL (ref 80.0–100.0)
Monocytes Absolute: 0.4 10*3/uL (ref 0.1–1.0)
Monocytes Relative: 9 %
Neutro Abs: 2.6 10*3/uL (ref 1.7–7.7)
Neutrophils Relative %: 66 %
Platelet Count: 32 10*3/uL — ABNORMAL LOW (ref 150–400)
RBC: 2.54 MIL/uL — ABNORMAL LOW (ref 3.87–5.11)
RDW: 20.5 % — ABNORMAL HIGH (ref 11.5–15.5)
WBC Count: 4 10*3/uL (ref 4.0–10.5)
nRBC: 0.8 % — ABNORMAL HIGH (ref 0.0–0.2)

## 2020-08-23 LAB — CMP (CANCER CENTER ONLY)
ALT: 35 U/L (ref 0–44)
AST: 36 U/L (ref 15–41)
Albumin: 3.4 g/dL — ABNORMAL LOW (ref 3.5–5.0)
Alkaline Phosphatase: 165 U/L — ABNORMAL HIGH (ref 38–126)
Anion gap: 6 (ref 5–15)
BUN: 23 mg/dL (ref 8–23)
CO2: 31 mmol/L (ref 22–32)
Calcium: 9.8 mg/dL (ref 8.9–10.3)
Chloride: 105 mmol/L (ref 98–111)
Creatinine: 1.24 mg/dL — ABNORMAL HIGH (ref 0.44–1.00)
GFR, Est AFR Am: 47 mL/min — ABNORMAL LOW (ref >60)
GFR, Estimated: 40 mL/min — ABNORMAL LOW (ref >60)
Glucose, Bld: 136 mg/dL — ABNORMAL HIGH (ref 70–99)
Potassium: 3.3 mmol/L — ABNORMAL LOW (ref 3.5–5.1)
Sodium: 142 mmol/L (ref 135–145)
Total Bilirubin: 0.5 mg/dL (ref 0.3–1.2)
Total Protein: 6.7 g/dL (ref 6.5–8.1)

## 2020-08-23 LAB — SAMPLE TO BLOOD BANK

## 2020-08-23 MED ORDER — SODIUM CHLORIDE 0.9% FLUSH
10.0000 mL | Freq: Once | INTRAVENOUS | Status: AC | PRN
  Administered 2020-08-23: 10 mL
  Filled 2020-08-23: qty 10

## 2020-08-23 NOTE — Telephone Encounter (Signed)
Called and spoke with pt, confirmed 10/4 appts

## 2020-08-23 NOTE — Assessment & Plan Note (Signed)
She has severe, recurrent pancytopenia with treatment She is not symptomatic today and does not need blood transfusion I will cancel her blood transfusion appointment today and chemo appointment tomorrow We will reschedule her treatment to the next 7 to 10 days with dose adjustment After her last cycle of treatment, I will order CT imaging and follow-up for her to see GYN surgeon for review So far, she has excellent response to therapy I am hopeful she can undergo surgery after that

## 2020-08-23 NOTE — Progress Notes (Signed)
[facility] OFFICE PROGRESS NOTE  Patient Care Team: [name], MD as PCP - Onalee Hua, Wille Glaser, OD as Referring Physician (Optometry) [name], MD as Consulting Physician (Dermatology) Latanya Maudlin, MD as Consulting Physician (Orthopedic Surgery) Inda Castle, MD (Inactive) as Consulting Physician (Gastroenterology) Jacqulyn Liner, RN as Oncology Nurse Navigator (Oncology) Heath Lark, MD as Consulting Physician (Hematology and Oncology)  ASSESSMENT & PLAN:  Ovarian cancer [facility]) She has severe, recurrent pancytopenia with treatment She is not symptomatic today and does not need blood transfusion I will cancel her blood transfusion appointment today and chemo appointment tomorrow We will reschedule her treatment to the next 7 to 10 days with dose adjustment After her last cycle of treatment, I will order CT imaging and follow-up for her to see GYN surgeon for review So far, she has excellent response to therapy I am hopeful she can undergo surgery after that  Chemotherapy-induced peripheral neuropathy ([place]) I plan to reduce the dose of Taxol little bit  Pancytopenia, acquired [facility]) She has chronic intermittent pancytopenia requiring transfusion support She does not need blood transfusion today I plan to reduce the dose of chemotherapy in the future When she returns in mid-October for CT imaging, I plan to bring her back and recheck her blood and will provide transfusion support as well  CKD stage 3 due to type 2 diabetes mellitus ([place]) She has significant fluctuation of her serum creatinine I will adjust the dose of chemotherapy accordingly   Orders Placed This Encounter  Procedures  . CT ABDOMEN PELVIS W CONTRAST    Standing Status:   Future    Standing Expiration Date:   08/23/2021    Order Specific Question:   If indicated for the ordered procedure, I authorize the administration of contrast media per Radiology protocol    Answer:   Yes     Order Specific Question:   Preferred imaging location?    Answer:   [facility] [place]    Order Specific Question:   Radiology Contrast Protocol - do NOT remove file path    Answer:   [email or web address]    All questions were answered. The patient knows to call the clinic with any problems, questions or concerns. The total time spent in the appointment was 30 minutes encounter with patients including review of chart and various tests results, discussions about plan of care and coordination of care plan   Heath Lark, MD 08/23/2020 12:10 PM  INTERVAL HISTORY: Please see below for problem oriented charting. She returns with her husband to be seen prior to cycle 6 of chemotherapy Since last time I saw her, she tolerated chemotherapy fairly well except for fatigue She denies worsening peripheral neuropathy Denies nausea or changes in bowel habits The patient denies any recent signs or symptoms of bleeding such as spontaneous epistaxis, hematuria or hematochezia.  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[place])  02/26/2016 Initial Diagnosis   Left breast biopsy 11:00 position: invasive ductal carcinoma with DCIS, ER 90%, PR 10%, HER-2 negative, Ki-67 30%, grade 2, 2.2 cm palpable lesion T2 N0 stage II a clinical stage   03/18/2016 Surgery   Left lumpectomy: Invasive ductal carcinoma, grade 2, 6.3 cm, with high-grade DCIS, margins negative, 0/4 lymph nodes negative, ER 90%, via 10%, HER-2 negative ratio 0.97, Ki-67 30%, T3 N0 stage IIB   03/25/2016 Procedure   Genetic testing is negative for pathogenic mutations within any of the 20  Genes on the breast/ovarian cancer panel   04/04/2016 Oncotype testing   Oncotype DX recurrence score 37, 25% 10 year distant risk of recurrence   04/17/2016 - 07/31/2016 Chemotherapy   Adjuvant chemotherapy with dose dense Adriamycin and Cytoxan followed by  Abraxane weekly 8 ( discontinued due to neuropathy)   09/01/2016 - 09/26/2016 Radiation Therapy   Adj XRT 1) Left breast: 42.5 Gy in 17 fractions. 2) Left breast boost: 7.5 Gy in 3 fractions.   12/02/2016 -  Anti-estrogen oral therapy   Anastrozole 1 mg daily   Ovarian cancer ([place])  04/03/2020 Imaging   US pelvis Complex cystic and solid mass in LEFT adnexa 12.5 cm diameter question cystic ovarian neoplasm; recommend correlation with serum tumor markers and further evaluation by MR imaging with and without contrast.     04/04/2020 Imaging   US venous Doppler No evidence of deep venous thrombosis in the right lower extremity. Left common femoral vein also patent.   04/15/2020 Imaging   MRI pelvis 1. Large complex solid and cystic mass arising from the right adnexa measuring 10.8 by 11.5 by 11.1 cm. This has an aggressive appearance with extensive enhancing mural soft tissue components. Findings are highly suspicious for malignant ovarian neoplasm. 2. Extensive bilateral retroperitoneal and bilateral iliac adenopathy compatible with metastatic disease. 3. Signs of extensive peritoneal carcinomatosis including ascites, enhancement, thickening and nodularity of the peritoneal reflections, omental caking and bulky peritoneal nodularity. 4. Suspected serosal involvement of the dome of bladder with loss of normal fat plane. Cannot rule out mural invasion by tumor.   04/17/2020 Tumor Marker   Patient's tumor was tested for the following markers: CA-125 Results of the tumor marker test revealed 1762.   04/20/2020 Cancer Staging   Staging form: Ovary, Fallopian Tube, and Primary Peritoneal Carcinoma, AJCC 8th Edition - Clinical: FIGO Stage IIIC (cT3, cN1, cM0) - Signed by Heath Lark, MD on 04/20/2020   04/23/2020 Imaging   1. Redemonstrated dominant mixed solid and cystic mass arising from the vicinity of the right ovary measuring at least 11.5 x 10.7 cm, not significantly changed compared to prior  MR, and consistent with primary ovarian malignancy.  2. Numerous bulky retroperitoneal, bilateral iliac, and pelvic sidewall lymph nodes. 3. Moderate volume ascites throughout the abdomen and pelvis with subtle thickening and nodularity throughout the peritoneum, and extensive bulky nodular metastatic disease of the omentum. 4. Constellation of findings is consistent with advanced nodal and peritoneal metastatic disease. 5. There are prominent subcentimeter epicardial lymph nodes, nonspecific although suspicious for nodal metastatic disease. No definite nodal metastatic disease in the chest. Attention on follow-up. 6. There are multiple small subpleural nodules at the right lung base overlying the diaphragm, measuring up to 7 mm. These are generally nonspecific and less favored to represent pulmonary metastatic disease given distribution. Attention on follow-up. 7. Somewhat coarse contour of the liver, suggestive of cirrhosis, although out without overt morphologic stigmata. 8. Aortic Atherosclerosis (ICD10-I70.0).     04/24/2020 Procedure   Successful placement of a right internal jugular approach power injectable Port-A-Cath. The catheter is ready for immediate use.     05/02/2020 Tumor Marker   Patient's tumor was tested for the following markers: CA-125 Results of the tumor marker test revealed 1921   05/03/2020 -  Chemotherapy   The patient had carboplatin and taxol for chemotherapy treatment.     05/08/2020 Procedure   Successful ultrasound-guided paracentesis yielding 2.6 liters of peritoneal fluid.   05/16/2020 Procedure   Successful ultrasound-guided paracentesis yielding  3.2 liters of peritoneal fluid   05/25/2020 Procedure   Successful ultrasound-guided paracentesis yielding 3 liters of peritoneal fluid.     06/01/2020 Procedure   Successful ultrasound-guided therapeutic paracentesis yielding 1.7 liters of peritoneal fluid.   06/14/2020 Tumor Marker   Patient's tumor was tested  for the following markers: CA-125 Results of the tumor marker test revealed 1309   06/20/2020 Imaging   1. Dominant mixed cystic and solid mass in the central pelvis is stable in the interval. 2. Clear interval decrease in retroperitoneal and pelvic sidewall lymphadenopathy. The bulky omental disease has also clearly decreased in the interval. 3. Interval decrease in ascites. 4. Stable 8 mm subpleural nodule along the diaphragm. 5. Aortic Atherosclerosis (ICD10-I70.0).   07/10/2020 Tumor Marker   Patient's tumor was tested for the following markers: CA-125 Results of the tumor marker test revealed 220   08/03/2020 Tumor Marker   Patient's tumor was tested for the following markers: CA-125 Results of the tumor marker test revealed 53.3.     REVIEW OF SYSTEMS:   Constitutional: Denies fevers, chills or abnormal weight loss Eyes: Denies blurriness of vision Ears, nose, mouth, throat, and face: Denies mucositis or sore throat Respiratory: Denies cough, dyspnea or wheezes Cardiovascular: Denies palpitation, chest discomfort or lower extremity swelling Gastrointestinal:  Denies nausea, heartburn or change in bowel habits Skin: Denies abnormal skin rashes Lymphatics: Denies new lymphadenopathy or easy bruising Behavioral/Psych: Mood is stable, no new changes  All other systems were reviewed with the patient and are negative.  I have reviewed the past medical history, past surgical history, social history and family history with the patient and they are unchanged from previous note.  ALLERGIES:  is allergic to keflex [cephalexin], meloxicam, prednisone, premarin [estrogens conjugated], and trovan [alatrofloxacin].  MEDICATIONS:  Current Outpatient Medications  Medication Sig Dispense Refill  . acetaminophen (TYLENOL) 325 MG tablet Take 325 mg by mouth every 6 (six) hours as needed for headache.    . allopurinol (ZYLOPRIM) 300 MG tablet Take 1/2 tablet Daily to Prevent Gout 90 tablet 3  .  blood glucose meter kit and supplies KIT Dispense based on patient and insurance preference. Use to check blood sugar once daily. Dx: E11.22, N18.30 1 each 12  . Cholecalciferol (VITAMIN D3) 5000 units CAPS Take 5,000 Units by mouth daily.    Marland Kitchen dexamethasone (DECADRON) 4 MG tablet Take 2 tabs at the night before and 2 tab the morning of chemotherapy, every 3 weeks, by mouth 36 tablet 5  . doxycycline (VIBRA-TABS) 100 MG tablet Take 1 tablet (100 mg total) by mouth 2 (two) times daily. 14 tablet 0  . gabapentin (NEURONTIN) 300 MG capsule Take 1 capsule 3 to 4  x /day as needed for pain (Patient taking differently: Take 300 mg by mouth See admin instructions. Take 300 mg 3 times daily may take a 4th 300 mg dose as needed for pain) 360 capsule 3  . glimepiride (AMARYL) 1 MG tablet Take 1 tab by mouth in the morning if fasting is running 150+. Please do not take this medication if you are ill or not eating as this can cause a low blood sugar. 90 tablet 1  . glucose blood (FREESTYLE TEST STRIPS) test strip Check Blood Sugar Daily as directed (Dx: E11.29) 100 strip 3  . HYDROcodone-acetaminophen (NORCO/VICODIN) 5-325 MG tablet Take 1 tablet by mouth every 6 (six) hours as needed for moderate pain. 30 tablet 0  . Lancets (FREESTYLE) lancets CHECK BLOOD SUGAR DAILY AS DIRECTED  100 each 3  . lidocaine-prilocaine (EMLA) cream Apply to affected area once (Patient taking differently: Apply 1 application topically daily as needed (port access). ) 30 g 3  . loratadine (CLARITIN) 10 MG tablet Take 10 mg by mouth daily as needed for allergies.    . Magnesium ([employer] MAGNESIUM) 250 MG TABS Take 250 mg by mouth 2 (two) times daily.    . metoprolol succinate (TOPROL-XL) 25 MG 24 hr tablet Take 1 tablet Daily for BP (Patient taking differently: Take 25 mg by mouth daily. ) 90 tablet 3  . MYRBETRIQ 50 MG TB24 tablet TAKE 1 TABLET(50 MG) BY MOUTH DAILY 90 tablet q  . ondansetron (ZOFRAN) 8 MG tablet Take 1 tablet (8 mg total)  by mouth 2 (two) times daily as needed for refractory nausea / vomiting. Start on day 3 after carboplatin chemo. (Patient not taking: Reported on 05/09/2020) 30 tablet 1  . prochlorperazine (COMPAZINE) 10 MG tablet Take 1 tablet (10 mg total) by mouth every 6 (six) hours as needed for nausea or vomiting. (Patient not taking: Reported on 05/09/2020) 60 tablet 9  . vitamin C (ASCORBIC ACID) 500 MG tablet Take 500 mg by mouth every evening.      No current facility-administered medications for this visit.    PHYSICAL EXAMINATION: ECOG PERFORMANCE STATUS: 1 - Symptomatic but completely ambulatory  Vitals:   08/23/20 1130  BP: 124/64  Pulse: 94  Resp: 18  Temp: 98.1 F (36.7 C)  SpO2: 99%   Filed Weights   08/23/20 1130  Weight: 135 lb 9.6 oz (61.5 kg)    GENERAL:alert, no distress and comfortable NEURO: alert & oriented x 3 with fluent speech, no focal motor/sensory deficits  LABORATORY DATA:  I have reviewed the data as listed    Component Value Date/Time   NA 142 08/23/2020 1100   NA 141 11/06/2016 1102   K 3.3 (L) 08/23/2020 1100   K 3.9 11/06/2016 1102   CL 105 08/23/2020 1100   CO2 31 08/23/2020 1100   CO2 27 11/06/2016 1102   GLUCOSE 136 (H) 08/23/2020 1100   GLUCOSE 130 11/06/2016 1102   BUN 23 08/23/2020 1100   BUN 18.0 11/06/2016 1102   CREATININE 1.24 (H) 08/23/2020 1100   CREATININE 1.48 (H) 05/09/2020 1115   CREATININE 1.3 (H) 11/06/2016 1102   CALCIUM 9.8 08/23/2020 1100   CALCIUM 10.2 11/06/2016 1102   PROT 6.7 08/23/2020 1100   PROT 7.1 11/06/2016 1102   ALBUMIN 3.4 (L) 08/23/2020 1100   ALBUMIN 3.7 11/06/2016 1102   AST 36 08/23/2020 1100   AST 52 (H) 11/06/2016 1102   ALT 35 08/23/2020 1100   ALT 58 (H) 11/06/2016 1102   ALKPHOS 165 (H) 08/23/2020 1100   ALKPHOS 159 (H) 11/06/2016 1102   BILITOT 0.5 08/23/2020 1100   BILITOT 0.53 11/06/2016 1102   GFRNONAA 40 (L) 08/23/2020 1100   GFRNONAA 33 (L) 05/09/2020 1115   GFRAA 47 (L) 08/23/2020 1100    GFRAA 38 (L) 05/09/2020 1115    No results found for: SPEP, UPEP  Lab Results  Component Value Date   WBC 4.0 08/23/2020   NEUTROABS 2.6 08/23/2020   HGB 8.4 (L) 08/23/2020   HCT 25.2 (L) 08/23/2020   MCV 99.2 08/23/2020   PLT 32 (L) 08/23/2020      Chemistry      Component Value Date/Time   NA 142 08/23/2020 1100   NA 141 11/06/2016 1102   K 3.3 (L) 08/23/2020 1100  K 3.9 11/06/2016 1102   CL 105 08/23/2020 1100   CO2 31 08/23/2020 1100   CO2 27 11/06/2016 1102   BUN 23 08/23/2020 1100   BUN 18.0 11/06/2016 1102   CREATININE 1.24 (H) 08/23/2020 1100   CREATININE 1.48 (H) 05/09/2020 1115   CREATININE 1.3 (H) 11/06/2016 1102      Component Value Date/Time   CALCIUM 9.8 08/23/2020 1100   CALCIUM 10.2 11/06/2016 1102   ALKPHOS 165 (H) 08/23/2020 1100   ALKPHOS 159 (H) 11/06/2016 1102   AST 36 08/23/2020 1100   AST 52 (H) 11/06/2016 1102   ALT 35 08/23/2020 1100   ALT 58 (H) 11/06/2016 1102   BILITOT 0.5 08/23/2020 1100   BILITOT 0.53 11/06/2016 1102

## 2020-08-23 NOTE — Patient Instructions (Signed)

## 2020-08-23 NOTE — Assessment & Plan Note (Signed)
She has chronic intermittent pancytopenia requiring transfusion support She does not need blood transfusion today I plan to reduce the dose of chemotherapy in the future When she returns in mid-October for CT imaging, I plan to bring her back and recheck her blood and will provide transfusion support as well

## 2020-08-23 NOTE — Assessment & Plan Note (Signed)
I plan to reduce the dose of Taxol little bit

## 2020-08-23 NOTE — Assessment & Plan Note (Signed)
She has significant fluctuation of her serum creatinine I will adjust the dose of chemotherapy accordingly

## 2020-08-24 ENCOUNTER — Inpatient Hospital Stay: Payer: PPO

## 2020-08-24 ENCOUNTER — Other Ambulatory Visit: Payer: Self-pay | Admitting: Adult Health

## 2020-08-29 ENCOUNTER — Other Ambulatory Visit: Payer: Self-pay | Admitting: Hematology and Oncology

## 2020-08-29 DIAGNOSIS — D61818 Other pancytopenia: Secondary | ICD-10-CM | POA: Diagnosis not present

## 2020-08-29 DIAGNOSIS — N1832 Chronic kidney disease, stage 3b: Secondary | ICD-10-CM | POA: Diagnosis not present

## 2020-08-29 DIAGNOSIS — I129 Hypertensive chronic kidney disease with stage 1 through stage 4 chronic kidney disease, or unspecified chronic kidney disease: Secondary | ICD-10-CM | POA: Diagnosis not present

## 2020-08-29 DIAGNOSIS — E1122 Type 2 diabetes mellitus with diabetic chronic kidney disease: Secondary | ICD-10-CM | POA: Diagnosis not present

## 2020-08-29 DIAGNOSIS — C569 Malignant neoplasm of unspecified ovary: Secondary | ICD-10-CM | POA: Diagnosis not present

## 2020-08-29 DIAGNOSIS — E785 Hyperlipidemia, unspecified: Secondary | ICD-10-CM | POA: Diagnosis not present

## 2020-08-30 LAB — HM DIABETES EYE EXAM

## 2020-09-03 ENCOUNTER — Other Ambulatory Visit: Payer: Self-pay | Admitting: Internal Medicine

## 2020-09-03 ENCOUNTER — Inpatient Hospital Stay: Payer: PPO

## 2020-09-03 ENCOUNTER — Other Ambulatory Visit: Payer: Self-pay

## 2020-09-03 ENCOUNTER — Inpatient Hospital Stay: Payer: PPO | Attending: Gynecologic Oncology

## 2020-09-03 VITALS — BP 136/67 | HR 88 | Temp 97.8°F | Resp 18 | Wt 137.5 lb

## 2020-09-03 DIAGNOSIS — Z923 Personal history of irradiation: Secondary | ICD-10-CM | POA: Insufficient documentation

## 2020-09-03 DIAGNOSIS — C561 Malignant neoplasm of right ovary: Secondary | ICD-10-CM

## 2020-09-03 DIAGNOSIS — C569 Malignant neoplasm of unspecified ovary: Secondary | ICD-10-CM

## 2020-09-03 DIAGNOSIS — Z170 Estrogen receptor positive status [ER+]: Secondary | ICD-10-CM

## 2020-09-03 DIAGNOSIS — C786 Secondary malignant neoplasm of retroperitoneum and peritoneum: Secondary | ICD-10-CM | POA: Insufficient documentation

## 2020-09-03 DIAGNOSIS — Z79811 Long term (current) use of aromatase inhibitors: Secondary | ICD-10-CM | POA: Insufficient documentation

## 2020-09-03 DIAGNOSIS — Z5111 Encounter for antineoplastic chemotherapy: Secondary | ICD-10-CM | POA: Insufficient documentation

## 2020-09-03 DIAGNOSIS — Z79899 Other long term (current) drug therapy: Secondary | ICD-10-CM | POA: Diagnosis not present

## 2020-09-03 DIAGNOSIS — D61818 Other pancytopenia: Secondary | ICD-10-CM | POA: Diagnosis not present

## 2020-09-03 DIAGNOSIS — C50212 Malignant neoplasm of upper-inner quadrant of left female breast: Secondary | ICD-10-CM | POA: Insufficient documentation

## 2020-09-03 DIAGNOSIS — Z9221 Personal history of antineoplastic chemotherapy: Secondary | ICD-10-CM | POA: Insufficient documentation

## 2020-09-03 DIAGNOSIS — D630 Anemia in neoplastic disease: Secondary | ICD-10-CM

## 2020-09-03 DIAGNOSIS — Z95828 Presence of other vascular implants and grafts: Secondary | ICD-10-CM

## 2020-09-03 DIAGNOSIS — Z90721 Acquired absence of ovaries, unilateral: Secondary | ICD-10-CM | POA: Insufficient documentation

## 2020-09-03 LAB — CMP (CANCER CENTER ONLY)
ALT: 37 U/L (ref 0–44)
AST: 42 U/L — ABNORMAL HIGH (ref 15–41)
Albumin: 3.5 g/dL (ref 3.5–5.0)
Alkaline Phosphatase: 158 U/L — ABNORMAL HIGH (ref 38–126)
Anion gap: 9 (ref 5–15)
BUN: 21 mg/dL (ref 8–23)
CO2: 26 mmol/L (ref 22–32)
Calcium: 9.9 mg/dL (ref 8.9–10.3)
Chloride: 106 mmol/L (ref 98–111)
Creatinine: 1.34 mg/dL — ABNORMAL HIGH (ref 0.44–1.00)
GFR, Est AFR Am: 43 mL/min — ABNORMAL LOW (ref >60)
GFR, Estimated: 37 mL/min — ABNORMAL LOW (ref >60)
Glucose, Bld: 210 mg/dL — ABNORMAL HIGH (ref 70–99)
Potassium: 4 mmol/L (ref 3.5–5.1)
Sodium: 141 mmol/L (ref 135–145)
Total Bilirubin: 0.6 mg/dL (ref 0.3–1.2)
Total Protein: 7 g/dL (ref 6.5–8.1)

## 2020-09-03 LAB — CBC WITH DIFFERENTIAL (CANCER CENTER ONLY)
Abs Immature Granulocytes: 0.03 10*3/uL (ref 0.00–0.07)
Basophils Absolute: 0 10*3/uL (ref 0.0–0.1)
Basophils Relative: 0 %
Eosinophils Absolute: 0 10*3/uL (ref 0.0–0.5)
Eosinophils Relative: 0 %
HCT: 27.9 % — ABNORMAL LOW (ref 36.0–46.0)
Hemoglobin: 9 g/dL — ABNORMAL LOW (ref 12.0–15.0)
Immature Granulocytes: 1 %
Lymphocytes Relative: 17 %
Lymphs Abs: 0.7 10*3/uL (ref 0.7–4.0)
MCH: 33.2 pg (ref 26.0–34.0)
MCHC: 32.3 g/dL (ref 30.0–36.0)
MCV: 103 fL — ABNORMAL HIGH (ref 80.0–100.0)
Monocytes Absolute: 0.1 10*3/uL (ref 0.1–1.0)
Monocytes Relative: 2 %
Neutro Abs: 3.1 10*3/uL (ref 1.7–7.7)
Neutrophils Relative %: 80 %
Platelet Count: 163 10*3/uL (ref 150–400)
RBC: 2.71 MIL/uL — ABNORMAL LOW (ref 3.87–5.11)
RDW: 21.1 % — ABNORMAL HIGH (ref 11.5–15.5)
WBC Count: 3.8 10*3/uL — ABNORMAL LOW (ref 4.0–10.5)
nRBC: 0 % (ref 0.0–0.2)

## 2020-09-03 LAB — SAMPLE TO BLOOD BANK

## 2020-09-03 MED ORDER — SODIUM CHLORIDE 0.9 % IV SOLN
150.0000 mg | Freq: Once | INTRAVENOUS | Status: AC
  Administered 2020-09-03: 150 mg via INTRAVENOUS
  Filled 2020-09-03: qty 150

## 2020-09-03 MED ORDER — SODIUM CHLORIDE 0.9% FLUSH
10.0000 mL | Freq: Once | INTRAVENOUS | Status: AC | PRN
  Administered 2020-09-03: 10 mL
  Filled 2020-09-03: qty 10

## 2020-09-03 MED ORDER — SODIUM CHLORIDE 0.9 % IV SOLN
226.4000 mg | Freq: Once | INTRAVENOUS | Status: AC
  Administered 2020-09-03: 230 mg via INTRAVENOUS
  Filled 2020-09-03: qty 23

## 2020-09-03 MED ORDER — SODIUM CHLORIDE 0.9 % IV SOLN
Freq: Once | INTRAVENOUS | Status: AC
  Filled 2020-09-03: qty 250

## 2020-09-03 MED ORDER — PALONOSETRON HCL INJECTION 0.25 MG/5ML
INTRAVENOUS | Status: AC
  Filled 2020-09-03: qty 5

## 2020-09-03 MED ORDER — FAMOTIDINE IN NACL 20-0.9 MG/50ML-% IV SOLN
INTRAVENOUS | Status: AC
  Filled 2020-09-03: qty 50

## 2020-09-03 MED ORDER — SODIUM CHLORIDE 0.9 % IV SOLN
105.0000 mg/m2 | Freq: Once | INTRAVENOUS | Status: AC
  Administered 2020-09-03: 174 mg via INTRAVENOUS
  Filled 2020-09-03: qty 29

## 2020-09-03 MED ORDER — HEPARIN SOD (PORK) LOCK FLUSH 100 UNIT/ML IV SOLN
500.0000 [IU] | Freq: Once | INTRAVENOUS | Status: AC | PRN
  Administered 2020-09-03: 500 [IU]
  Filled 2020-09-03: qty 5

## 2020-09-03 MED ORDER — FAMOTIDINE IN NACL 20-0.9 MG/50ML-% IV SOLN
20.0000 mg | Freq: Once | INTRAVENOUS | Status: AC
  Administered 2020-09-03: 20 mg via INTRAVENOUS

## 2020-09-03 MED ORDER — DIPHENHYDRAMINE HCL 50 MG/ML IJ SOLN
25.0000 mg | Freq: Once | INTRAMUSCULAR | Status: AC
  Administered 2020-09-03: 25 mg via INTRAVENOUS

## 2020-09-03 MED ORDER — SODIUM CHLORIDE 0.9% FLUSH
10.0000 mL | INTRAVENOUS | Status: DC | PRN
  Administered 2020-09-03: 10 mL
  Filled 2020-09-03: qty 10

## 2020-09-03 MED ORDER — DIPHENHYDRAMINE HCL 50 MG/ML IJ SOLN
INTRAMUSCULAR | Status: AC
  Filled 2020-09-03: qty 1

## 2020-09-03 MED ORDER — SODIUM CHLORIDE 0.9 % IV SOLN
10.0000 mg | Freq: Once | INTRAVENOUS | Status: AC
  Administered 2020-09-03: 10 mg via INTRAVENOUS
  Filled 2020-09-03: qty 10

## 2020-09-03 MED ORDER — PALONOSETRON HCL INJECTION 0.25 MG/5ML
0.2500 mg | Freq: Once | INTRAVENOUS | Status: AC
  Administered 2020-09-03: 0.25 mg via INTRAVENOUS

## 2020-09-03 NOTE — Patient Instructions (Signed)
[facility] Discharge Instructions for Patients Receiving Chemotherapy  Today you received the following chemotherapy agents: Paclitaxel (Taxol) and Carboplatin.  To help prevent nausea and vomiting after your treatment, we encourage you to take your nausea medication as directed by your MD.   If you develop nausea and vomiting that is not controlled by your nausea medication, call the clinic.   BELOW ARE SYMPTOMS THAT SHOULD BE REPORTED IMMEDIATELY:  *FEVER GREATER THAN 100.5 F  *CHILLS WITH OR WITHOUT FEVER  NAUSEA AND VOMITING THAT IS NOT CONTROLLED WITH YOUR NAUSEA MEDICATION  *UNUSUAL SHORTNESS OF BREATH  *UNUSUAL BRUISING OR BLEEDING  TENDERNESS IN MOUTH AND THROAT WITH OR WITHOUT PRESENCE OF ULCERS  *URINARY PROBLEMS  *BOWEL PROBLEMS  UNUSUAL RASH Items with * indicate a potential emergency and should be followed up as soon as possible.  Feel free to call the clinic should you have any questions or concerns. The clinic phone number is [phone].  Please show the [place] at check-in to the Emergency Department and triage nurse.

## 2020-09-03 NOTE — Patient Instructions (Signed)

## 2020-09-04 ENCOUNTER — Encounter: Payer: Self-pay | Admitting: Internal Medicine

## 2020-09-04 LAB — CA 125: Cancer Antigen (CA) 125: 29.7 U/mL (ref 0.0–38.1)

## 2020-09-11 ENCOUNTER — Telehealth: Payer: Self-pay | Admitting: *Deleted

## 2020-09-11 NOTE — Telephone Encounter (Signed)
Patient called back and changed her appt from a in person visit to a phone visit

## 2020-09-11 NOTE — Telephone Encounter (Signed)
Called and left the patient a message to call the office back. Patient's appt needs to be changed to a phone visit

## 2020-09-13 ENCOUNTER — Ambulatory Visit: Payer: PPO | Admitting: Gynecologic Oncology

## 2020-09-14 ENCOUNTER — Ambulatory Visit: Payer: PPO | Admitting: Hematology and Oncology

## 2020-09-14 ENCOUNTER — Encounter: Payer: Self-pay | Admitting: Genetic Counselor

## 2020-09-14 DIAGNOSIS — Z1379 Encounter for other screening for genetic and chromosomal anomalies: Secondary | ICD-10-CM | POA: Insufficient documentation

## 2020-09-18 ENCOUNTER — Other Ambulatory Visit: Payer: Self-pay | Admitting: Internal Medicine

## 2020-09-18 DIAGNOSIS — I10 Essential (primary) hypertension: Secondary | ICD-10-CM

## 2020-09-19 ENCOUNTER — Other Ambulatory Visit: Payer: Self-pay

## 2020-09-19 ENCOUNTER — Ambulatory Visit (HOSPITAL_COMMUNITY)
Admission: RE | Admit: 2020-09-19 | Discharge: 2020-09-19 | Disposition: A | Discharge status: Home / Self Care | Payer: PPO | Source: Ambulatory Visit | Attending: Hematology and Oncology | Admitting: Hematology and Oncology

## 2020-09-19 ENCOUNTER — Inpatient Hospital Stay: Payer: PPO

## 2020-09-19 ENCOUNTER — Encounter (HOSPITAL_COMMUNITY): Payer: Self-pay

## 2020-09-19 DIAGNOSIS — C569 Malignant neoplasm of unspecified ovary: Secondary | ICD-10-CM

## 2020-09-19 DIAGNOSIS — Z170 Estrogen receptor positive status [ER+]: Secondary | ICD-10-CM

## 2020-09-19 DIAGNOSIS — I700 Atherosclerosis of aorta: Secondary | ICD-10-CM | POA: Diagnosis not present

## 2020-09-19 DIAGNOSIS — N83292 Other ovarian cyst, left side: Secondary | ICD-10-CM | POA: Diagnosis not present

## 2020-09-19 DIAGNOSIS — Z95828 Presence of other vascular implants and grafts: Secondary | ICD-10-CM

## 2020-09-19 DIAGNOSIS — Z5111 Encounter for antineoplastic chemotherapy: Secondary | ICD-10-CM | POA: Diagnosis not present

## 2020-09-19 DIAGNOSIS — C561 Malignant neoplasm of right ovary: Secondary | ICD-10-CM

## 2020-09-19 DIAGNOSIS — N83291 Other ovarian cyst, right side: Secondary | ICD-10-CM | POA: Diagnosis not present

## 2020-09-19 DIAGNOSIS — D630 Anemia in neoplastic disease: Secondary | ICD-10-CM

## 2020-09-19 DIAGNOSIS — Z853 Personal history of malignant neoplasm of breast: Secondary | ICD-10-CM | POA: Diagnosis not present

## 2020-09-19 LAB — CBC WITH DIFFERENTIAL (CANCER CENTER ONLY)
Abs Immature Granulocytes: 0.11 10*3/uL — ABNORMAL HIGH (ref 0.00–0.07)
Basophils Absolute: 0 10*3/uL (ref 0.0–0.1)
Basophils Relative: 1 %
Eosinophils Absolute: 0 10*3/uL (ref 0.0–0.5)
Eosinophils Relative: 1 %
HCT: 26.3 % — ABNORMAL LOW (ref 36.0–46.0)
Hemoglobin: 8.7 g/dL — ABNORMAL LOW (ref 12.0–15.0)
Immature Granulocytes: 4 %
Lymphocytes Relative: 34 %
Lymphs Abs: 1 10*3/uL (ref 0.7–4.0)
MCH: 34.1 pg — ABNORMAL HIGH (ref 26.0–34.0)
MCHC: 33.1 g/dL (ref 30.0–36.0)
MCV: 103.1 fL — ABNORMAL HIGH (ref 80.0–100.0)
Monocytes Absolute: 0.5 10*3/uL (ref 0.1–1.0)
Monocytes Relative: 19 %
Neutro Abs: 1.2 10*3/uL — ABNORMAL LOW (ref 1.7–7.7)
Neutrophils Relative %: 41 %
Platelet Count: 88 10*3/uL — ABNORMAL LOW (ref 150–400)
RBC: 2.55 MIL/uL — ABNORMAL LOW (ref 3.87–5.11)
RDW: 17.9 % — ABNORMAL HIGH (ref 11.5–15.5)
WBC Count: 2.8 10*3/uL — ABNORMAL LOW (ref 4.0–10.5)
nRBC: 0 % (ref 0.0–0.2)

## 2020-09-19 LAB — SAMPLE TO BLOOD BANK

## 2020-09-19 LAB — CMP (CANCER CENTER ONLY)
ALT: 49 U/L — ABNORMAL HIGH (ref 0–44)
AST: 52 U/L — ABNORMAL HIGH (ref 15–41)
Albumin: 3.6 g/dL (ref 3.5–5.0)
Alkaline Phosphatase: 177 U/L — ABNORMAL HIGH (ref 38–126)
Anion gap: 5 (ref 5–15)
BUN: 24 mg/dL — ABNORMAL HIGH (ref 8–23)
CO2: 28 mmol/L (ref 22–32)
Calcium: 10.3 mg/dL (ref 8.9–10.3)
Chloride: 106 mmol/L (ref 98–111)
Creatinine: 1.18 mg/dL — ABNORMAL HIGH (ref 0.44–1.00)
GFR, Estimated: 43 mL/min — ABNORMAL LOW (ref >60)
Glucose, Bld: 98 mg/dL (ref 70–99)
Potassium: 3.7 mmol/L (ref 3.5–5.1)
Sodium: 139 mmol/L (ref 135–145)
Total Bilirubin: 0.4 mg/dL (ref 0.3–1.2)
Total Protein: 7.1 g/dL (ref 6.5–8.1)

## 2020-09-19 MED ORDER — SODIUM CHLORIDE 0.9% FLUSH
10.0000 mL | Freq: Once | INTRAVENOUS | Status: AC
  Administered 2020-09-19: 10 mL
  Filled 2020-09-19: qty 10

## 2020-09-19 MED ORDER — HEPARIN SOD (PORK) LOCK FLUSH 100 UNIT/ML IV SOLN
500.0000 [IU] | Freq: Once | INTRAVENOUS | Status: AC
  Administered 2020-09-19: 500 [IU] via INTRAVENOUS

## 2020-09-19 MED ORDER — HEPARIN SOD (PORK) LOCK FLUSH 100 UNIT/ML IV SOLN
INTRAVENOUS | Status: AC
  Filled 2020-09-19: qty 5

## 2020-09-19 MED ORDER — IOHEXOL 300 MG/ML  SOLN
100.0000 mL | Freq: Once | INTRAMUSCULAR | Status: AC | PRN
  Administered 2020-09-19: 80 mL via INTRAVENOUS

## 2020-09-19 NOTE — Patient Instructions (Signed)

## 2020-09-21 ENCOUNTER — Other Ambulatory Visit: Payer: Self-pay | Admitting: Internal Medicine

## 2020-09-21 ENCOUNTER — Encounter: Payer: Self-pay | Admitting: Gynecologic Oncology

## 2020-09-21 ENCOUNTER — Telehealth: Payer: Self-pay | Admitting: *Deleted

## 2020-09-21 ENCOUNTER — Inpatient Hospital Stay (HOSPITAL_BASED_OUTPATIENT_CLINIC_OR_DEPARTMENT_OTHER): Payer: PPO | Admitting: Gynecologic Oncology

## 2020-09-21 DIAGNOSIS — C569 Malignant neoplasm of unspecified ovary: Secondary | ICD-10-CM | POA: Diagnosis not present

## 2020-09-21 DIAGNOSIS — D6481 Anemia due to antineoplastic chemotherapy: Secondary | ICD-10-CM | POA: Diagnosis not present

## 2020-09-21 DIAGNOSIS — D61818 Other pancytopenia: Secondary | ICD-10-CM

## 2020-09-21 DIAGNOSIS — T451X5A Adverse effect of antineoplastic and immunosuppressive drugs, initial encounter: Secondary | ICD-10-CM

## 2020-09-21 DIAGNOSIS — E1140 Type 2 diabetes mellitus with diabetic neuropathy, unspecified: Secondary | ICD-10-CM

## 2020-09-21 NOTE — Telephone Encounter (Addendum)
Called and scheduled the patient for a pre-op appt with Melissa on 11/1 per Dr Berline Lopes   Patient stated "Dr Berline Lopes said I could possibly be mid November or early December. I would like it if it could be the week before Thanksgiving." Explained the message will be giving to [facility] APP

## 2020-09-21 NOTE — Progress Notes (Signed)
Gynecologic Oncology Telehealth Consult Note: Gyn-Onc  I connected with Kayla Baker on 09/21/20 at  2:30 PM EDT by telephone and verified that I am speaking with the correct person using two identifiers.  I discussed the limitations, risks, security and privacy concerns of performing an evaluation and management service by telemedicine and the availability of in-person appointments. I also discussed with the patient that there may be a patient responsible charge related to this service. The patient expressed understanding and agreed to proceed.  Other persons participating in the visit and their role in the encounter: patient's husband, [name].  Patient's location: home  Reason for Visit: discussion of surgery, recent CT imaging  Treatment History: Oncology History  Breast cancer of upper-inner quadrant of left female breast ([place])  02/26/2016 Initial Diagnosis   Left breast biopsy 11:00 position: invasive ductal carcinoma with DCIS, ER 90%, PR 10%, HER-2 negative, Ki-67 30%, grade 2, 2.2 cm palpable lesion T2 N0 stage II a clinical stage   03/18/2016 Surgery   Left lumpectomy: Invasive ductal carcinoma, grade 2, 6.3 cm, with high-grade DCIS, margins negative, 0/4 lymph nodes negative, ER 90%, via 10%, HER-2 negative ratio 0.97, Ki-67 30%, T3 N0 stage IIB   03/25/2016 Procedure   Genetic testing is negative for pathogenic mutations within any of the 20 Genes on the breast/ovarian cancer panel   04/04/2016 Oncotype testing   Oncotype DX recurrence score 37, 25% 10 year distant risk of recurrence   04/17/2016 - 07/31/2016 Chemotherapy   Adjuvant chemotherapy with dose dense Adriamycin and Cytoxan followed by Abraxane weekly 8 ( discontinued due to neuropathy)   09/01/2016 - 09/26/2016 Radiation Therapy   Adj XRT 1) Left breast: 42.5 Gy in 17 fractions. 2) Left breast boost: 7.5 Gy in 3 fractions.   12/02/2016 -  Anti-estrogen oral therapy   Anastrozole 1 mg daily   Ovarian cancer ([place])   04/03/2020 Imaging   US pelvis Complex cystic and solid mass in LEFT adnexa 12.5 cm diameter question cystic ovarian neoplasm; recommend correlation with serum tumor markers and further evaluation by MR imaging with and without contrast.     04/04/2020 Imaging   US venous Doppler No evidence of deep venous thrombosis in the right lower extremity. Left common femoral vein also patent.   04/15/2020 Imaging   MRI pelvis 1. Large complex solid and cystic mass arising from the right adnexa measuring 10.8 by 11.5 by 11.1 cm. This has an aggressive appearance with extensive enhancing mural soft tissue components. Findings are highly suspicious for malignant ovarian neoplasm. 2. Extensive bilateral retroperitoneal and bilateral iliac adenopathy compatible with metastatic disease. 3. Signs of extensive peritoneal carcinomatosis including ascites, enhancement, thickening and nodularity of the peritoneal reflections, omental caking and bulky peritoneal nodularity. 4. Suspected serosal involvement of the dome of bladder with loss of normal fat plane. Cannot rule out mural invasion by tumor.   04/17/2020 Tumor Marker   Patient's tumor was tested for the following markers: CA-125 Results of the tumor marker test revealed 1762.   04/20/2020 Cancer Staging   Staging form: Ovary, Fallopian Tube, and Primary Peritoneal Carcinoma, AJCC 8th Edition - Clinical: FIGO Stage IIIC (cT3, cN1, cM0) - Signed by Heath Lark, MD on 04/20/2020   04/23/2020 Imaging   1. Redemonstrated dominant mixed solid and cystic mass arising from the vicinity of the right ovary measuring at least 11.5 x 10.7 cm, not significantly changed compared to prior MR, and consistent with primary ovarian malignancy.  2. Numerous bulky retroperitoneal, bilateral  iliac, and pelvic sidewall lymph nodes. 3. Moderate volume ascites throughout the abdomen and pelvis with subtle thickening and nodularity throughout the peritoneum, and extensive bulky  nodular metastatic disease of the omentum. 4. Constellation of findings is consistent with advanced nodal and peritoneal metastatic disease. 5. There are prominent subcentimeter epicardial lymph nodes, nonspecific although suspicious for nodal metastatic disease. No definite nodal metastatic disease in the chest. Attention on follow-up. 6. There are multiple small subpleural nodules at the right lung base overlying the diaphragm, measuring up to 7 mm. These are generally nonspecific and less favored to represent pulmonary metastatic disease given distribution. Attention on follow-up. 7. Somewhat coarse contour of the liver, suggestive of cirrhosis, although out without overt morphologic stigmata. 8. Aortic Atherosclerosis (ICD10-I70.0).     04/24/2020 Procedure   Successful placement of a right internal jugular approach power injectable Port-A-Cath. The catheter is ready for immediate use.     05/02/2020 Tumor Marker   Patient's tumor was tested for the following markers: CA-125 Results of the tumor marker test revealed 1921   05/03/2020 -  Chemotherapy   The patient had carboplatin and taxol for chemotherapy treatment.     05/08/2020 Procedure   Successful ultrasound-guided paracentesis yielding 2.6 liters of peritoneal fluid.   05/16/2020 Procedure   Successful ultrasound-guided paracentesis yielding 3.2 liters of peritoneal fluid   05/25/2020 Procedure   Successful ultrasound-guided paracentesis yielding 3 liters of peritoneal fluid.     06/01/2020 Procedure   Successful ultrasound-guided therapeutic paracentesis yielding 1.7 liters of peritoneal fluid.   06/14/2020 Tumor Marker   Patient's tumor was tested for the following markers: CA-125 Results of the tumor marker test revealed 1309   06/20/2020 Imaging   1. Dominant mixed cystic and solid mass in the central pelvis is stable in the interval. 2. Clear interval decrease in retroperitoneal and pelvic sidewall lymphadenopathy. The bulky  omental disease has also clearly decreased in the interval. 3. Interval decrease in ascites. 4. Stable 8 mm subpleural nodule along the diaphragm. 5. Aortic Atherosclerosis (ICD10-I70.0).   07/10/2020 Tumor Marker   Patient's tumor was tested for the following markers: CA-125 Results of the tumor marker test revealed 220   08/03/2020 Tumor Marker   Patient's tumor was tested for the following markers: CA-125 Results of the tumor marker test revealed 53.3.   09/03/2020 Tumor Marker   Patient's tumor was tested for the following markers: CA-125 Results of the tumor marker test revealed 29.7   09/20/2020 Imaging   1. Substantial improvement. The right ovarian cystic and solid mass is half the volume that it measured on 06/20/2020. Similar reduction in the bulk of the omental caking of tumor. Prior ascites is resolved and the retroperitoneal adenopathy is markedly improved. 2. Other imaging findings of potential clinical significance: Stable pleural-based nodularity along the right hemidiaphragm measuring about 1.1 by 0.7 by 0.3 cm. Stable hypodense lesion of the right kidney upper pole, probably a cyst although the configuration of this lesion makes it difficult to obtain accurate density measurements. Left ovarian cyst, stable. Chondrocalcinosis involving the acetabular labra. Mild lumbar spondylosis and degenerative disc disease. 3. Aortic atherosclerosis.       Interval History: The patient reports feeling very well since her last chemotherapy cycle. She endorses a good appetite without nausea, emesis, early satiety or weight loss. She endorses regular bowel function (uses Miralax prn) and bladder function.   Her chemotherapy  Was overall tolerated well with the exception of pancytopenia requiring dose reduction and cycle delay.    Her last cycle of NACT with carboplatin and taxol was on 10/4.   Past Medical/Surgical History: Past Medical History:  Diagnosis Date  . Allergy   . Anemia    . Arthritis   . Blood transfusion without reported diagnosis   . Breast cancer of upper-inner quadrant of left female breast ([facility]) 02/29/2016   skin- 2016- squamous- on right upper arm  . GERD (gastroesophageal reflux disease)   . Gout    takes Allopurinol daily  . History of chemotherapy   . History of colon polyps    benign  . History of shingles   . Hx of radiation therapy   . Hyperlipidemia   . Hypertension   . Ovarian ca ([facility]) dx'd 03/2020  . Personal history of chemotherapy    2017  . Personal history of radiation therapy    2017  . Vitamin D deficiency     Past Surgical History:  Procedure Laterality Date  . ABDOMINAL HYSTERECTOMY  1973  . BREAST BIOPSY Left 02/26/2016  . BREAST LUMPECTOMY Left 03/18/2016  . CARDIAC CATHETERIZATION     patient denies this procedure  . CATARACT EXTRACTION, BILATERAL  2014   right eye 2/14; left eye 3/3  . COLONOSCOPY    . HAND SURGERY Left 2012  . INCONTINENCE SURGERY  2006  . IR IMAGING GUIDED PORT INSERTION  04/24/2020  . IR PARACENTESIS  05/08/2020  . IR PARACENTESIS  05/16/2020  . IR PARACENTESIS  05/25/2020  . KNEE ARTHROSCOPY Right   . MASTOPEXY Right 06/16/2017   Procedure: RIGHT BREAST MASTOPEXY;  Surgeon: Thimmappa, Brinda, MD;  Location: [facility];  Service: Plastics;  Laterality: Right;  . PORT-A-CATH REMOVAL N/A 11/27/2016   Procedure: REMOVAL PORT-A-CATH;  Surgeon: Haywood Ingram, MD;  Location: [facility];  Service: General;  Laterality: N/A;  . PORTACATH PLACEMENT N/A 04/14/2016   Procedure: INSERTION PORT-A-CATH ;  Surgeon: Haywood Ingram, MD;  Location: [facility];  Service: General;  Laterality: N/A;  . RADIOACTIVE SEED GUIDED PARTIAL MASTECTOMY WITH AXILLARY SENTINEL LYMPH NODE BIOPSY Left 03/18/2016   Procedure: RADIOACTIVE SEED GUIDED LEFT PARTIAL MASTECTOMY WITH AXILLARY SENTINEL LYMPH NODE BIOPSY AND BLUE DYE INJECTION;  Surgeon: Haywood Ingram, MD;  Location: [facility];  Service: General;  Laterality: Left;  .  REDUCTION MAMMAPLASTY Right 2018    Family History  Problem Relation Age of Onset  . Stroke Mother 80  . Other Mother        history of hysterectomy after last childbirth  . Diabetes Father   . Alzheimer's disease Father   . Hypertension Sister   . Lung cancer Sister 67       smoker  . Other Sister        history of hysterectomy for fibroids  . Breast cancer Sister 53  . Esophageal cancer Maternal Aunt 74       not a smoker  . Heart attack Maternal Uncle 65  . Cirrhosis Sister   . Lung cancer Other        nephew dx. 60s; +smoker  . Epilepsy Other   . Other Other 12       great niece dx. benign ganglioglioma brain tumor; treated at [facility]  . Epilepsy Other        no seizures in 2 years  . Parkinson's disease Maternal Aunt   . Stroke Maternal Grandmother   . Heart Problems Maternal Grandmother   . Pancreatic cancer Cousin 55       paternal 1st cousin  .   Colon cancer Neg Hx   . Stomach cancer Neg Hx   . Colon polyps Neg Hx   . Ulcerative colitis Neg Hx     Social History   Socioeconomic History  . Marital status: Married    Spouse name: [name]  . Number of children: 0  . Years of education: Not on file  . Highest education level: Not on file  Occupational History  . Not on file  Tobacco Use  . Smoking status: Never Smoker  . Smokeless tobacco: Never Used  Vaping Use  . Vaping Use: Never used  Substance and Sexual Activity  . Alcohol use: No  . Drug use: No  . Sexual activity: Not on file  Other Topics Concern  . Not on file  Social History Narrative  . Not on file   Social Determinants of Health   Financial Resource Strain:   . Difficulty of Paying Living Expenses: Not on file  Food Insecurity:   . Worried About Running Out of Food in the Last Year: Not on file  . Ran Out of Food in the Last Year: Not on file  Transportation Needs:   . Lack of Transportation (Medical): Not on file  . Lack of Transportation (Non-Medical): Not on file  Physical Activity:    . Days of Exercise per Week: Not on file  . Minutes of Exercise per Session: Not on file  Stress:   . Feeling of Stress : Not on file  Social Connections:   . Frequency of Communication with Friends and Family: Not on file  . Frequency of Social Gatherings with Friends and Family: Not on file  . Attends Religious Services: Not on file  . Active Member of Clubs or Organizations: Not on file  . Attends Club or Organization Meetings: Not on file  . Marital Status: Not on file    Current Medications:  Current Outpatient Medications:  .  acetaminophen (TYLENOL) 325 MG tablet, Take 325 mg by mouth every 6 (six) hours as needed for headache., Disp: , Rfl:  .  allopurinol (ZYLOPRIM) 300 MG tablet, Take 1/2 tablet Daily to Prevent Gout, Disp: 90 tablet, Rfl: 3 .  blood glucose meter kit and supplies KIT, Dispense based on patient and insurance preference. Use to check blood sugar once daily. Dx: E11.22, N18.30, Disp: 1 each, Rfl: 12 .  Cholecalciferol (VITAMIN D3) 5000 units CAPS, Take 5,000 Units by mouth daily., Disp: , Rfl:  .  dexamethasone (DECADRON) 4 MG tablet, Take 2 tabs at the night before and 2 tab the morning of chemotherapy, every 3 weeks, by mouth, Disp: 36 tablet, Rfl: 5 .  doxycycline (VIBRA-TABS) 100 MG tablet, Take 1 tablet (100 mg total) by mouth 2 (two) times daily., Disp: 14 tablet, Rfl: 0 .  gabapentin (NEURONTIN) 300 MG capsule, TAKE 1 CAPSULE BY MOUTH 3 TO 4 TIMES PER DAY AS NEEDED FOR PAIN, Disp: 360 capsule, Rfl: 3 .  glimepiride (AMARYL) 1 MG tablet, Take 1 tab by mouth in the morning if fasting is running 150+. Please do not take this medication if you are ill or not eating as this can cause a low blood sugar., Disp: 90 tablet, Rfl: 1 .  glucose blood (FREESTYLE TEST STRIPS) test strip, Check Blood Sugar Daily as directed (Dx: E11.29), Disp: 100 strip, Rfl: 3 .  HYDROcodone-acetaminophen (NORCO/VICODIN) 5-325 MG tablet, Take 1 tablet by mouth every 6 (six) hours as  needed for moderate pain., Disp: 30 tablet, Rfl: 0 .  Lancets (  FREESTYLE) lancets, CHECK BLOOD SUGAR DAILY AS DIRECTED, Disp: 100 each, Rfl: 3 .  lidocaine-prilocaine (EMLA) cream, Apply to affected area once (Patient taking differently: Apply 1 application topically daily as needed (port access). ), Disp: 30 g, Rfl: 3 .  loratadine (CLARITIN) 10 MG tablet, Take 10 mg by mouth daily as needed for allergies., Disp: , Rfl:  .  Magnesium ([employer] MAGNESIUM) 250 MG TABS, Take 250 mg by mouth 2 (two) times daily., Disp: , Rfl:  .  metoprolol succinate (TOPROL-XL) 25 MG 24 hr tablet, TAKE 1 TABLET BY MOUTH DAILY FOR BLOOD PRESSURE, Disp: 90 tablet, Rfl: 3 .  mirabegron ER (MYRBETRIQ) 50 MG TB24 tablet, Take     1 tablet    Daily    for Bladder Control, Disp: 90 tablet, Rfl: 0 .  ondansetron (ZOFRAN) 8 MG tablet, Take 1 tablet (8 mg total) by mouth 2 (two) times daily as needed for refractory nausea / vomiting. Start on day 3 after carboplatin chemo. (Patient not taking: Reported on 05/09/2020), Disp: 30 tablet, Rfl: 1 .  prochlorperazine (COMPAZINE) 10 MG tablet, Take 1 tablet (10 mg total) by mouth every 6 (six) hours as needed for nausea or vomiting. (Patient not taking: Reported on 05/09/2020), Disp: 60 tablet, Rfl: 9 .  vitamin C (ASCORBIC ACID) 500 MG tablet, Take 500 mg by mouth every evening. , Disp: , Rfl:   Review of Symptoms: Pertinent positives as per HPI, otherwise ROS negative.  Physical Exam: There were no vitals taken for this visit. Deferred given limitations of phone visit  Laboratory & Radiologic Studies: CBC    Component Value Date/Time   WBC 2.8 (L) 09/19/2020 0948   WBC 6.2 05/09/2020 1115   RBC 2.55 (L) 09/19/2020 0948   HGB 8.7 (L) 09/19/2020 0948   HGB 12.9 11/06/2016 1102   HCT 26.3 (L) 09/19/2020 0948   HCT 38.0 11/06/2016 1102   PLT 88 (L) 09/19/2020 0948   PLT 89 (L) 11/06/2016 1102   MCV 103.1 (H) 09/19/2020 0948   MCV 90.0 11/06/2016 1102   MCH 34.1 (H) 09/19/2020  0948   MCHC 33.1 09/19/2020 0948   RDW 17.9 (H) 09/19/2020 0948   RDW 14.1 11/06/2016 1102   LYMPHSABS 1.0 09/19/2020 0948   LYMPHSABS 1.6 11/06/2016 1102   MONOABS 0.5 09/19/2020 0948   MONOABS 0.5 11/06/2016 1102   EOSABS 0.0 09/19/2020 0948   EOSABS 0.2 11/06/2016 1102   BASOSABS 0.0 09/19/2020 0948   BASOSABS 0.0 11/06/2016 1102   CMP Latest Ref Rng & Units 09/19/2020 09/03/2020 08/23/2020  Glucose 70 - 99 mg/dL 98 210(H) 136(H)  BUN 8 - 23 mg/dL 24(H) 21 23  Creatinine 0.44 - 1.00 mg/dL 1.18(H) 1.34(H) 1.24(H)  Sodium 135 - 145 mmol/L 139 141 142  Potassium 3.5 - 5.1 mmol/L 3.7 4.0 3.3(L)  Chloride 98 - 111 mmol/L 106 106 105  CO2 22 - 32 mmol/L _0 Calcium 8.9 - 10.3 mg/dL 10.3 9.9 9.8  Total Protein 6.5 - 8.1 g/dL 7.1 7.0 6.7  Total Bilirubin 0.3 - 1.2 mg/dL 0.4 0.6 0.5  Alkaline Phos 38 - 126 U/L 177(H) 158(H) 165(H)  AST 15 - 41 U/L 52(H) 42(H) 36  ALT 0 - 44 U/L 49(H) 37 35    Assessment & Plan: TYKESHA KONICKI is a 84 y.o. 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  The patient has done exceptionally well from a symptom  standpoint during NACT. She has maintained her weight and her most recent albumin is 3.6. Despite requiring dose delays and reduction for pancytopenia induced by treatment, she has had a good response chemically (CA-125 has normalized) and radiographically. I think she is a good candidate for IDS.  We discussed in detail disease still noted on CT scan. I think that she could undergo MIS interval debulking, with a minilap for delivery of her omentum. This would also allow for palpation of anything not able to be adequately visualized robotically.   Given her persistent pancytopenia on labs on 10/20, I suspect that she will need >4 weeks from cycle 6 prior to surgery. I suggested that she come in for a pre-op appt with Melissa the first week of November. We will get labs at  that time and then decide surgical timing. Ideally, we would either perform her surgery in mid-November or just after Thanksgiving. I also discussed with the patient and her husband that I will not be able to operate until mid-November, at the earliest. They are understanding that depending on timing of surgery, my partner Dr. Denman George may perform the surgery.  I discussed the assessment and treatment plan with the patient. The patient was provided with an opportunity to ask questions and all were answered. The patient agreed with the plan and demonstrated an understanding of the instructions.   The patient was advised to call back or see an in-person evaluation if the symptoms worsen or if the condition fails to improve as anticipated.   25 minutes of total time was spent for this patient encounter, including preparation, face-to-face counseling with the patient and coordination of care, and documentation of the encounter.   Jeral Pinch, MD  Division of Gynecologic Oncology  Department of Obstetrics and Gynecology  [facility]

## 2020-09-28 NOTE — Progress Notes (Signed)
Patient here with her husband for a pre-operative appointment. After discussion with Dr. Berline Lopes, it is decided that surgery at [facility] would be most appropriate in regards to timing between chemotherapy and from her last cycle of chemotherapy. Lab work will be obtained today and the patient will be contacted by Dr. Berline Lopes tomorrow. Plan will be for surgery on October 18, 2020 with Dr. Everitt Amber at [facility] for interval debulking. Discussed the use of lovenox post-operatively with the patient and her husband. Advised she would be contacted with a date and time for a pre-op appt at [facility]. Maps given for [facility]. All questions answered. Advised to call for any needs or concerns.

## 2020-09-28 NOTE — Patient Instructions (Addendum)
Plan to have lab work today to re-evaluate your blood counts and Dr. Berline Lopes will call you with the results.   We will get you scheduled for surgery at [facility] with Dr. Everitt Amber on October 18, 2020. If your counts are normal, Dr. Berline Lopes will call and see if one of her partners has a sooner date.  You will have to have a pre-op appt at [facility] prior to surgery.  After surgery, you will need to be on lovenox blood thinner injections once daily to prevent blood clots. These will either for 2 weeks (laparoscopic surgery) or for 4 weeks (open surgery).  Implanted [facility] Guide An implanted port is a device that is placed under the skin. It is usually placed in the chest. The device can be used to give IV medicine, to take blood, or for dialysis. You may have an implanted port if:  You need IV medicine that would be irritating to the small veins in your hands or arms.  You need IV medicines, such as antibiotics, for a long period of time.  You need IV nutrition for a long period of time.  You need dialysis. Having a port means that your health care provider will not need to use the veins in your arms for these procedures. You may have fewer limitations when using a port than you would if you used other types of long-term IVs, and you will likely be able to return to normal activities after your incision heals. An implanted port has two main parts:  Reservoir. The reservoir is the part where a needle is inserted to give medicines or draw blood. The reservoir is round. After it is placed, it appears as a small, raised area under your skin.  Catheter. The catheter is a thin, flexible tube that connects the reservoir to a vein. Medicine that is inserted into the reservoir goes into the catheter and then into the vein. How is my port accessed? To access your port:  A numbing cream may be placed on the skin over the port site.  Your health care provider will put on a mask and sterile gloves.  The skin  over your port will be cleaned carefully with a germ-killing soap and allowed to dry.  Your health care provider will gently pinch the port and insert a needle into it.  Your health care provider will check for a blood return to make sure the port is in the vein and is not clogged.  If your port needs to remain accessed to get medicine continuously (constant infusion), your health care provider will place a clear bandage (dressing) over the needle site. The dressing and needle will need to be changed every week, or as told by your health care provider. What is flushing? Flushing helps keep the port from getting clogged. Follow instructions from your health care provider about how and when to flush the port. Ports are usually flushed with saline solution or a medicine called heparin. The need for flushing will depend on how the port is used:  If the port is only used from time to time to give medicines or draw blood, the port may need to be flushed: ? Before and after medicines have been given. ? Before and after blood has been drawn. ? As part of routine maintenance. Flushing may be recommended every 4-6 weeks.  If a constant infusion is running, the port may not need to be flushed.  Throw away any syringes in a disposal container  that is meant for sharp items (sharps container). You can buy a sharps container from a pharmacy, or you can make one by using an empty hard plastic bottle with a cover. How long will my port stay implanted? The port can stay in for as long as your health care provider thinks it is needed. When it is time for the port to come out, a surgery will be done to remove it. The surgery will be similar to the procedure that was done to put the port in. Follow these instructions at home:   Flush your port as told by your health care provider.  If you need an infusion over several days, follow instructions from your health care provider about how to take care of your port  site. Make sure you: ? Wash your hands with soap and water before you change your dressing. If soap and water are not available, use alcohol-based hand sanitizer. ? Change your dressing as told by your health care provider. ? Place any used dressings or infusion bags into a plastic bag. Throw that bag in the trash. ? Keep the dressing that covers the needle clean and dry. Do not get it wet. ? Do not use scissors or sharp objects near the tube. ? Keep the tube clamped, unless it is being used.  Check your port site every day for signs of infection. Check for: ? Redness, swelling, or pain. ? Fluid or blood. ? Pus or a bad smell.  Protect the skin around the port site. ? Avoid wearing bra straps that rub or irritate the site. ? Protect the skin around your port from seat belts. Place a soft pad over your chest if needed.  Bathe or shower as told by your health care provider. The site may get wet as long as you are not actively receiving an infusion.  Return to your normal activities as told by your health care provider. Ask your health care provider what activities are safe for you.  Carry a medical alert card or wear a medical alert bracelet at all times. This will let health care providers know that you have an implanted port in case of an emergency. Get help right away if:  You have redness, swelling, or pain at the port site.  You have fluid or blood coming from your port site.  You have pus or a bad smell coming from the port site.  You have a fever. Summary  Implanted ports are usually placed in the chest for long-term IV access.  Follow instructions from your health care provider about flushing the port and changing bandages (dressings).  Take care of the area around your port by avoiding clothing that puts pressure on the area, and by watching for signs of infection.  Protect the skin around your port from seat belts. Place a soft pad over your chest if needed.  Get help  right away if you have a fever or you have redness, swelling, pain, drainage, or a bad smell at the port site. This information is not intended to replace advice given to you by your health care provider. Make sure you discuss any questions you have with your health care provider. Document Revised: 03/11/2019 Document Reviewed: 12/20/2016 Elsevier Patient Education  2020 [employer].

## 2020-10-01 ENCOUNTER — Inpatient Hospital Stay: Payer: PPO | Attending: Gynecologic Oncology | Admitting: Gynecologic Oncology

## 2020-10-01 ENCOUNTER — Inpatient Hospital Stay: Payer: PPO

## 2020-10-01 ENCOUNTER — Encounter: Payer: Self-pay | Admitting: Gynecologic Oncology

## 2020-10-01 ENCOUNTER — Other Ambulatory Visit: Payer: Self-pay

## 2020-10-01 VITALS — BP 127/57 | HR 91 | Temp 97.2°F | Resp 16 | Ht 64.0 in | Wt 141.5 lb

## 2020-10-01 DIAGNOSIS — C50212 Malignant neoplasm of upper-inner quadrant of left female breast: Secondary | ICD-10-CM

## 2020-10-01 DIAGNOSIS — C569 Malignant neoplasm of unspecified ovary: Secondary | ICD-10-CM

## 2020-10-01 DIAGNOSIS — C786 Secondary malignant neoplasm of retroperitoneum and peritoneum: Secondary | ICD-10-CM | POA: Insufficient documentation

## 2020-10-01 DIAGNOSIS — C561 Malignant neoplasm of right ovary: Secondary | ICD-10-CM

## 2020-10-01 DIAGNOSIS — Z4682 Encounter for fitting and adjustment of non-vascular catheter: Secondary | ICD-10-CM | POA: Diagnosis not present

## 2020-10-01 DIAGNOSIS — Z95828 Presence of other vascular implants and grafts: Secondary | ICD-10-CM

## 2020-10-01 DIAGNOSIS — D630 Anemia in neoplastic disease: Secondary | ICD-10-CM

## 2020-10-01 DIAGNOSIS — Z170 Estrogen receptor positive status [ER+]: Secondary | ICD-10-CM

## 2020-10-01 LAB — CMP (CANCER CENTER ONLY)
ALT: 37 U/L (ref 0–44)
AST: 42 U/L — ABNORMAL HIGH (ref 15–41)
Albumin: 3.6 g/dL (ref 3.5–5.0)
Alkaline Phosphatase: 149 U/L — ABNORMAL HIGH (ref 38–126)
Anion gap: 7 (ref 5–15)
BUN: 25 mg/dL — ABNORMAL HIGH (ref 8–23)
CO2: 28 mmol/L (ref 22–32)
Calcium: 9.9 mg/dL (ref 8.9–10.3)
Chloride: 106 mmol/L (ref 98–111)
Creatinine: 1.17 mg/dL — ABNORMAL HIGH (ref 0.44–1.00)
GFR, Estimated: 47 mL/min — ABNORMAL LOW (ref >60)
Glucose, Bld: 107 mg/dL — ABNORMAL HIGH (ref 70–99)
Potassium: 3.6 mmol/L (ref 3.5–5.1)
Sodium: 141 mmol/L (ref 135–145)
Total Bilirubin: 0.4 mg/dL (ref 0.3–1.2)
Total Protein: 6.8 g/dL (ref 6.5–8.1)

## 2020-10-01 LAB — CBC (CANCER CENTER ONLY)
HCT: 27.5 % — ABNORMAL LOW (ref 36.0–46.0)
Hemoglobin: 9 g/dL — ABNORMAL LOW (ref 12.0–15.0)
MCH: 34.2 pg — ABNORMAL HIGH (ref 26.0–34.0)
MCHC: 32.7 g/dL (ref 30.0–36.0)
MCV: 104.6 fL — ABNORMAL HIGH (ref 80.0–100.0)
Platelet Count: 130 10*3/uL — ABNORMAL LOW (ref 150–400)
RBC: 2.63 MIL/uL — ABNORMAL LOW (ref 3.87–5.11)
RDW: 17.5 % — ABNORMAL HIGH (ref 11.5–15.5)
WBC Count: 3.9 10*3/uL — ABNORMAL LOW (ref 4.0–10.5)
nRBC: 0 % (ref 0.0–0.2)

## 2020-10-01 MED ORDER — HEPARIN SOD (PORK) LOCK FLUSH 100 UNIT/ML IV SOLN
500.0000 [IU] | Freq: Once | INTRAVENOUS | Status: AC | PRN
  Administered 2020-10-01: 500 [IU]
  Filled 2020-10-01: qty 5

## 2020-10-01 MED ORDER — SODIUM CHLORIDE 0.9% FLUSH
10.0000 mL | Freq: Once | INTRAVENOUS | Status: AC | PRN
  Administered 2020-10-01: 10 mL
  Filled 2020-10-01: qty 10

## 2020-10-01 NOTE — Progress Notes (Signed)
FOLLOW UP  Assessment and Plan:   Hypertension Well controlled with current medications  Monitor blood pressure at home; patient to call if consistently greater than 130/80 Continue DASH diet.   Reminder to go to the ER if any CP, SOB, nausea, dizziness, severe HA, changes vision/speech, left arm numbness and tingling and jaw pain.  Hyperlipidemia associated with T2DM (HCC) Was rosuvastatin 5 mg three days a week- hx of LFT elevation with atorvastatin - holding per oncology instructions while doing chemotherapy  LDL goal is <70 Continue low cholesterol diet and exercise.  Check lipid panel.   Diabetes with diabetic chronic kidney disease Currently with slow weight loss and A1Cs in prediabetic range; off metformin, on PRN low dose glimepiride -take only if fasting 150+, needs rarely  Continue diet and exercise.  Perform daily foot/skin check, notify office of any concerning changes.  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 Hollie Salk Off of metformin Increase fluids, avoid NSAIDS, monitor sugars, will monitor  Diabetic neuropathy, painful (HCC)/ chemotherapy induced neuropathy (HCC) Currently prescribed cymbalta during the day, gabapentin 100-300 mg at night.   Vitamin D Def/ osteoporosis prevention Below goal at recent check; continue to recommend supplementation for goal of 60-100, taking regularly  Check vitamin D level at CPE  Ovarian cancer [facility]) Chemotherapy followed by Dr. [name], has port, undergoing resection by Dr. Denman George 10/18/2020 then plan to restart chemo for 1-3 rounds; monitor  Has routine CBC/CMP by oncology   Continue diet and meds as discussed. Further disposition pending results of labs. Discussed med's effects and SE's.   Over 30 minutes of exam, counseling, chart review, and critical decision making was performed.   Future Appointments  Date Time Provider [place]  02/21/2021  9:00 AM Liane Comber, NP [facility] None     ----------------------------------------------------------------------------------------------------------------------  HPI 84 y.o. female  presents for 3 month follow up on hypertension, cholesterol, diabetes with CKD and vitamin D deficiency.   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   Earlier this year she was unfortunately diagnosed with large R sided ovarian tumor with consequent RLE edema, urinary incontinence (wearing depends and on myrbetriq with benefit), had port cath placed and started chemotherapy carboplaxin and taxol 05/03/2020 managed by Dr. Heath Lark. Initially felt abdominal distention with persistent carcinomatosis, malignant ascites and underwent urgent paracentesis with 2.6 L removed 05/09/2020 without recurrence. Patient has tolerated chemotherapy since excepting some pancytopenia/anemia that have required 2 transfusions and delayed some treatments; she underwent CT in 09/19/2020 which showed tumor mass reduced by over 50%, has been scheduled for surgery by Dr. Everitt Amber at [facility] on 10/18/2020.   BMI is Body mass index is 23.93 kg/m., she has been working on diet and exercise. Wt Readings from Last 3 Encounters:  10/03/20 139 lb 6.4 oz (63.2 kg)  10/01/20 141 lb 8.6 oz (64.2 kg)  09/03/20 137 lb 8 oz (62.4 kg)   Her blood pressure has been controlled at home, today their BP is BP: 134/64  She does not currently workout on a consistent basis due to ongoing neuropathy- she is prescribed cymbalta and gabapentin for neuropathy. She denies chest pain, shortness of breath, dizziness.   She is not on cholesterol medication (was on zetia, rosuvastatin 5 mg three days a week - holding per oncology since chemotherapy) and denies myalgias. She reports she was previously on atorvastatin but this was  discontinued secondary to elevated LFTs. Her cholesterol is at goal. The cholesterol last  visit was:   Lab Results  Component Value Date   CHOL 126 05/09/2020   HDL 37 (L) 05/09/2020   LDLCALC 67 05/09/2020   TRIG 132 05/09/2020   CHOLHDL 3.4 05/09/2020    She has been working on diet and exercise for T2 diabetes with CKD 3/4 (off of metformin, now takes glimepiride 1 mg AM if glucose 150+), and denies increased appetite, nausea, polydipsia, polyuria, visual disturbances, vomiting and weight loss.  She does check fasting glucose, ranges 90-124, this AM 112. Has taken glimepride 3 times since last visit, denies problems with hypoglycemia.  Last A1C in the office was:  Lab Results  Component Value Date   HGBA1C 5.9 (H) 05/09/2020   CKD IIIb associated with T2DM, follows with Dr. Hollie Salk, last GFR:  Lab Results  Component Value Date   GFRNONAA 47 (L) 10/01/2020   Patient is on Vitamin D supplement and below goal at the last check, taking 5000 IU daily :   Lab Results  Component Value Date   VD25OH 37 05/09/2020     Patient is on allopurinol, taking 150 mg only for gout and does not report a recent flare.  Lab Results  Component Value Date   LABURIC 4.9 05/09/2020     Current Medications:  Current Outpatient Medications on File Prior to Visit  Medication Sig  . acetaminophen (TYLENOL) 325 MG tablet Take 325 mg by mouth every 6 (six) hours as needed for headache.  . allopurinol (ZYLOPRIM) 300 MG tablet Take 1/2 tablet Daily to Prevent Gout  . blood glucose meter kit and supplies KIT Dispense based on patient and insurance preference. Use to check blood sugar once daily. Dx: E11.22, N18.30  . Cholecalciferol (VITAMIN D3) 5000 units CAPS Take 5,000 Units by mouth daily.  Marland Kitchen dexamethasone (DECADRON) 4 MG tablet Take 2 tabs at the night before and 2 tab the morning of chemotherapy, every 3 weeks, by mouth (Patient not taking: Reported on 10/01/2020)  . gabapentin (NEURONTIN) 300 MG capsule TAKE 1 CAPSULE BY MOUTH 3 TO 4 TIMES PER DAY AS NEEDED FOR PAIN  . glimepiride (AMARYL)  1 MG tablet Take 1 tab by mouth in the morning if fasting is running 150+. Please do not take this medication if you are ill or not eating as this can cause a low blood sugar.  Marland Kitchen glucose blood (FREESTYLE TEST STRIPS) test strip Check Blood Sugar Daily as directed (Dx: E11.29)  . HYDROcodone-acetaminophen (NORCO/VICODIN) 5-325 MG tablet Take 1 tablet by mouth every 6 (six) hours as needed for moderate pain. (Patient not taking: Reported on 10/01/2020)  . Lancets (FREESTYLE) lancets CHECK BLOOD SUGAR DAILY AS DIRECTED  . lidocaine-prilocaine (EMLA) cream Apply to affected area once (Patient taking differently: Apply 1 application topically daily as needed (port access). )  . loratadine (CLARITIN) 10 MG tablet Take 10 mg by mouth daily as needed for allergies.  . Magnesium ([employer] MAGNESIUM) 250 MG TABS Take 250 mg by mouth 2 (two) times daily.  . metoprolol succinate (TOPROL-XL) 25 MG 24 hr tablet TAKE 1 TABLET BY MOUTH DAILY FOR BLOOD PRESSURE  . mirabegron ER (MYRBETRIQ) 50 MG TB24 tablet Take     1 tablet    Daily    for Bladder Control  . ondansetron (ZOFRAN) 8 MG tablet Take 1 tablet (8 mg total) by mouth 2 (two) times daily as needed for refractory nausea / vomiting. Start on  day 3 after carboplatin chemo. (Patient not taking: Reported on 05/09/2020)  . prochlorperazine (COMPAZINE) 10 MG tablet Take 1 tablet (10 mg total) by mouth every 6 (six) hours as needed for nausea or vomiting. (Patient not taking: Reported on 05/09/2020)  . vitamin C (ASCORBIC ACID) 500 MG tablet Take 500 mg by mouth every evening.    No current facility-administered medications on file prior to visit.     Allergies:  Allergies  Allergen Reactions  . Keflex [Cephalexin] Swelling    Tongue and throat swells  . Meloxicam Swelling  . Prednisone Swelling    Can Not take High doses  . Premarin [Estrogens Conjugated] Hives  . Trovan [Alatrofloxacin] Other (See Comments)    Unknown reaction     Medical History:  Past  Medical History:  Diagnosis Date  . Allergy   . Anemia   . Arthritis   . Blood transfusion without reported diagnosis   . Breast cancer of upper-inner quadrant of left female breast ([place]) 02/29/2016   skin- 2016- squamous- on right upper arm  . GERD (gastroesophageal reflux disease)   . Gout    takes Allopurinol daily  . History of chemotherapy   . History of colon polyps    benign  . History of shingles   . Hx of radiation therapy   . Hyperlipidemia   . Hypertension   . Ovarian ca ([place]) dx'd 03/2020  . Personal history of chemotherapy    2017  . Personal history of radiation therapy    2017  . Vitamin D deficiency    Family history- Reviewed and unchanged Social history- Reviewed and unchanged   Review of Systems:  Review of Systems  Constitutional: Negative for malaise/fatigue and weight loss.  HENT: Negative for hearing loss and tinnitus.   Eyes: Negative for blurred vision and double vision.  Respiratory: Negative for cough, shortness of breath and wheezing.   Cardiovascular: Positive for leg swelling (R>L). Negative for chest pain, palpitations, orthopnea and claudication.  Gastrointestinal: Negative for abdominal pain, blood in stool, constipation, diarrhea, heartburn, melena, nausea and vomiting.  Genitourinary: Negative.   Musculoskeletal: Negative for joint pain and myalgias.  Skin: Negative for rash.  Neurological: Negative for dizziness, tingling (Consistent with baseline in bilateral feet), sensory change, weakness and headaches.  Endo/Heme/Allergies: Negative for polydipsia.  Psychiatric/Behavioral: Negative.  The patient does not have insomnia.   All other systems reviewed and are negative.   Physical Exam: BP 134/64   Pulse 82   Temp (!) 97.3 F (36.3 C)   Wt 139 lb 6.4 oz (63.2 kg)   SpO2 97%   BMI 23.93 kg/m  Wt Readings from Last 3 Encounters:  10/03/20 139 lb 6.4 oz (63.2 kg)  10/01/20 141 lb 8.6 oz (64.2 kg)  09/03/20 137 lb 8 oz (62.4 kg)    General Appearance: Chronically ill appearing well dressed elderly female, in no apparent distress. Eyes: PERRLA, EOMs, conjunctiva no swelling or erythema Sinuses: No Frontal/maxillary tenderness ENT/Mouth: Ext aud canals clear, TMs without erythema, bulging. No erythema, swelling, or exudate on post pharynx.  Tonsils not swollen or erythematous. Hearing normal.  Neck: Supple, thyroid normal.  Respiratory: Respiratory effort normal, BS equal bilaterally without rales, rhonchi, wheezing or stridor.  Cardio: RRR with no MRGs. Brisk peripheral pulses with nonpitting edema to R lower leg.   Abdomen: Soft, non-distended, + BS.  Non tender, no guarding, rebound, hernias. Lymphatics: Non tender without lymphadenopathy.  Musculoskeletal: Full ROM, 5/5 strength, Normal gait Skin: Warm,  dry without rashes, lesions, ecchymosis.  Neuro: Cranial nerves intact. No cerebellar symptoms.  Psych: Awake and oriented X 3, normal affect, Insight and Judgment appropriate.    Izora Ribas, NP 12:35 PM [facility]

## 2020-10-01 NOTE — Progress Notes (Signed)
Spoke with the patient and her husband about surgical plan. Will be getting lab work today to see how counts have recovered. We had talked last about tentatively waiting until I could do the surgery, but I would prefer that she proceed sooner than that (ideally mid-November) if her counts allow. When I spoke to the patient and her husband previously, we'd talked about balancing the risks associated with her blood counts with delaying surgery too long and losing some of the ground we'd gained from chemotherapy. The patient is very understanding and is amenable to proceeding with surgery with Dr. Denman George.  At this time, given limitations of schedule at [facility], we will work to schedule the patient for surgery on 11/18 when Dr. Denman George is operating at [facility]. She understands that this will mean a visit earlier that week for pre-op and COVID test.   Abdominal exam is improved from visit in 03/2020. She is non-distended, non-tender with minimal fullness in the lower abdomen. On bimanual exam. There is a mobile mass within the cul de sac with significantly less nodularity appreciated. Rectum feel free from the mass.  Given size of the ovarian lesion and some persistent omental disease, we will plan to schedule the patient for an ex-lap, BSO, omentectomy, and tumor debulking.  Plan for labs today. Discussed need for lovenox post-op.  Jeral Pinch MD Gynecologic Oncology

## 2020-10-02 ENCOUNTER — Encounter: Payer: Self-pay | Admitting: Gynecologic Oncology

## 2020-10-02 LAB — CA 125: Cancer Antigen (CA) 125: 24 U/mL (ref 0.0–38.1)

## 2020-10-02 NOTE — Progress Notes (Signed)
I called patient and reviewed her labs from yesterday. Overall counts are recovering well. I have reached out to the scheduler at [facility] and will continue to work on getting surgery scheduled with Dr. Denman George on 11/18.  Valarie Cones MD

## 2020-10-03 ENCOUNTER — Ambulatory Visit (INDEPENDENT_AMBULATORY_CARE_PROVIDER_SITE_OTHER): Payer: PPO | Admitting: Adult Health

## 2020-10-03 ENCOUNTER — Other Ambulatory Visit: Payer: Self-pay

## 2020-10-03 ENCOUNTER — Encounter: Payer: Self-pay | Admitting: Adult Health

## 2020-10-03 VITALS — BP 134/64 | HR 82 | Temp 97.3°F | Wt 139.4 lb

## 2020-10-03 DIAGNOSIS — E1169 Type 2 diabetes mellitus with other specified complication: Secondary | ICD-10-CM

## 2020-10-03 DIAGNOSIS — E1140 Type 2 diabetes mellitus with diabetic neuropathy, unspecified: Secondary | ICD-10-CM | POA: Diagnosis not present

## 2020-10-03 DIAGNOSIS — Z79899 Other long term (current) drug therapy: Secondary | ICD-10-CM | POA: Diagnosis not present

## 2020-10-03 DIAGNOSIS — C50212 Malignant neoplasm of upper-inner quadrant of left female breast: Secondary | ICD-10-CM

## 2020-10-03 DIAGNOSIS — D61818 Other pancytopenia: Secondary | ICD-10-CM

## 2020-10-03 DIAGNOSIS — C569 Malignant neoplasm of unspecified ovary: Secondary | ICD-10-CM

## 2020-10-03 DIAGNOSIS — E440 Moderate protein-calorie malnutrition: Secondary | ICD-10-CM

## 2020-10-03 DIAGNOSIS — G620 Drug-induced polyneuropathy: Secondary | ICD-10-CM

## 2020-10-03 DIAGNOSIS — N183 Chronic kidney disease, stage 3 unspecified: Secondary | ICD-10-CM

## 2020-10-03 DIAGNOSIS — E1122 Type 2 diabetes mellitus with diabetic chronic kidney disease: Secondary | ICD-10-CM | POA: Diagnosis not present

## 2020-10-03 DIAGNOSIS — E785 Hyperlipidemia, unspecified: Secondary | ICD-10-CM

## 2020-10-03 DIAGNOSIS — Z170 Estrogen receptor positive status [ER+]: Secondary | ICD-10-CM

## 2020-10-03 DIAGNOSIS — T451X5A Adverse effect of antineoplastic and immunosuppressive drugs, initial encounter: Secondary | ICD-10-CM

## 2020-10-03 DIAGNOSIS — I10 Essential (primary) hypertension: Secondary | ICD-10-CM | POA: Diagnosis not present

## 2020-10-03 DIAGNOSIS — E559 Vitamin D deficiency, unspecified: Secondary | ICD-10-CM | POA: Diagnosis not present

## 2020-10-03 NOTE — Patient Instructions (Signed)
If bladder is better - less leakage - can try stopping myrbetriq   Try to take a brisk walk daily      A great goal to work towards is aiming to get in a serving daily of some of the most nutritionally dense foods - G- BOMBS daily

## 2020-10-04 LAB — MICROALBUMIN / CREATININE URINE RATIO
Creatinine, Urine: 39 mg/dL (ref 20–275)
Microalb, Ur: <0.2 mg/dL

## 2020-10-04 LAB — LIPID PANEL
Cholesterol: 274 mg/dL — ABNORMAL HIGH (ref <200)
HDL: 37 mg/dL — ABNORMAL LOW (ref >=50)
LDL Cholesterol (Calc): 181 mg/dL (calc) — ABNORMAL HIGH
Non-HDL Cholesterol (Calc): 237 mg/dL (calc) — ABNORMAL HIGH (ref <130)
Total CHOL/HDL Ratio: 7.4 (calc) — ABNORMAL HIGH (ref <5.0)
Triglycerides: 332 mg/dL — ABNORMAL HIGH (ref <150)

## 2020-10-04 LAB — HEMOGLOBIN A1C
Hgb A1c MFr Bld: 5.7 % of total Hgb — ABNORMAL HIGH (ref <5.7)
Mean Plasma Glucose: 117 (calc)
eAG (mmol/L): 6.5 (calc)

## 2020-10-04 LAB — MAGNESIUM: Magnesium: 1.8 mg/dL (ref 1.5–2.5)

## 2020-10-04 LAB — VITAMIN D 25 HYDROXY (VIT D DEFICIENCY, FRACTURES): Vit D, 25-Hydroxy: 45 ng/mL (ref 30–100)

## 2020-10-04 LAB — TSH: TSH: 4.18 mIU/L (ref 0.40–4.50)

## 2020-10-15 DIAGNOSIS — Z20822 Contact with and (suspected) exposure to covid-19: Secondary | ICD-10-CM | POA: Diagnosis not present

## 2020-10-15 DIAGNOSIS — E1140 Type 2 diabetes mellitus with diabetic neuropathy, unspecified: Secondary | ICD-10-CM | POA: Diagnosis not present

## 2020-10-15 DIAGNOSIS — M5136 Other intervertebral disc degeneration, lumbar region: Secondary | ICD-10-CM | POA: Diagnosis not present

## 2020-10-15 DIAGNOSIS — I700 Atherosclerosis of aorta: Secondary | ICD-10-CM | POA: Diagnosis not present

## 2020-10-15 DIAGNOSIS — R590 Localized enlarged lymph nodes: Secondary | ICD-10-CM | POA: Diagnosis not present

## 2020-10-15 DIAGNOSIS — T451X5S Adverse effect of antineoplastic and immunosuppressive drugs, sequela: Secondary | ICD-10-CM | POA: Diagnosis not present

## 2020-10-15 DIAGNOSIS — M11259 Other chondrocalcinosis, unspecified hip: Secondary | ICD-10-CM | POA: Diagnosis not present

## 2020-10-15 DIAGNOSIS — Z803 Family history of malignant neoplasm of breast: Secondary | ICD-10-CM | POA: Diagnosis not present

## 2020-10-15 DIAGNOSIS — G620 Drug-induced polyneuropathy: Secondary | ICD-10-CM | POA: Diagnosis not present

## 2020-10-15 DIAGNOSIS — Z800 Family history of malignant neoplasm of digestive organs: Secondary | ICD-10-CM | POA: Diagnosis not present

## 2020-10-15 DIAGNOSIS — E1122 Type 2 diabetes mellitus with diabetic chronic kidney disease: Secondary | ICD-10-CM | POA: Diagnosis not present

## 2020-10-15 DIAGNOSIS — Z01812 Encounter for preprocedural laboratory examination: Secondary | ICD-10-CM | POA: Diagnosis not present

## 2020-10-15 DIAGNOSIS — Z9012 Acquired absence of left breast and nipple: Secondary | ICD-10-CM | POA: Diagnosis not present

## 2020-10-15 DIAGNOSIS — Z01818 Encounter for other preprocedural examination: Secondary | ICD-10-CM | POA: Diagnosis not present

## 2020-10-15 DIAGNOSIS — Z801 Family history of malignant neoplasm of trachea, bronchus and lung: Secondary | ICD-10-CM | POA: Diagnosis not present

## 2020-10-15 DIAGNOSIS — N838 Other noninflammatory disorders of ovary, fallopian tube and broad ligament: Secondary | ICD-10-CM | POA: Diagnosis not present

## 2020-10-15 DIAGNOSIS — Z923 Personal history of irradiation: Secondary | ICD-10-CM | POA: Diagnosis not present

## 2020-10-15 DIAGNOSIS — Z9221 Personal history of antineoplastic chemotherapy: Secondary | ICD-10-CM | POA: Diagnosis not present

## 2020-10-15 DIAGNOSIS — M47816 Spondylosis without myelopathy or radiculopathy, lumbar region: Secondary | ICD-10-CM | POA: Diagnosis not present

## 2020-10-15 DIAGNOSIS — N183 Chronic kidney disease, stage 3 unspecified: Secondary | ICD-10-CM | POA: Diagnosis not present

## 2020-10-15 DIAGNOSIS — E785 Hyperlipidemia, unspecified: Secondary | ICD-10-CM | POA: Diagnosis not present

## 2020-10-15 DIAGNOSIS — I129 Hypertensive chronic kidney disease with stage 1 through stage 4 chronic kidney disease, or unspecified chronic kidney disease: Secondary | ICD-10-CM | POA: Diagnosis not present

## 2020-10-15 DIAGNOSIS — R188 Other ascites: Secondary | ICD-10-CM | POA: Diagnosis not present

## 2020-10-15 DIAGNOSIS — N83202 Unspecified ovarian cyst, left side: Secondary | ICD-10-CM | POA: Diagnosis not present

## 2020-10-15 DIAGNOSIS — C569 Malignant neoplasm of unspecified ovary: Secondary | ICD-10-CM | POA: Diagnosis not present

## 2020-10-18 DIAGNOSIS — N801 Endometriosis of ovary: Secondary | ICD-10-CM | POA: Diagnosis not present

## 2020-10-18 DIAGNOSIS — C561 Malignant neoplasm of right ovary: Secondary | ICD-10-CM | POA: Diagnosis not present

## 2020-10-18 DIAGNOSIS — C579 Malignant neoplasm of female genital organ, unspecified: Secondary | ICD-10-CM | POA: Diagnosis not present

## 2020-10-18 DIAGNOSIS — N183 Chronic kidney disease, stage 3 unspecified: Secondary | ICD-10-CM | POA: Diagnosis not present

## 2020-10-18 DIAGNOSIS — Z9012 Acquired absence of left breast and nipple: Secondary | ICD-10-CM | POA: Diagnosis not present

## 2020-10-18 DIAGNOSIS — I129 Hypertensive chronic kidney disease with stage 1 through stage 4 chronic kidney disease, or unspecified chronic kidney disease: Secondary | ICD-10-CM | POA: Diagnosis not present

## 2020-10-18 DIAGNOSIS — Z79899 Other long term (current) drug therapy: Secondary | ICD-10-CM | POA: Diagnosis not present

## 2020-10-18 DIAGNOSIS — Z01812 Encounter for preprocedural laboratory examination: Secondary | ICD-10-CM | POA: Diagnosis not present

## 2020-10-18 DIAGNOSIS — Z90710 Acquired absence of both cervix and uterus: Secondary | ICD-10-CM | POA: Diagnosis not present

## 2020-10-18 DIAGNOSIS — E785 Hyperlipidemia, unspecified: Secondary | ICD-10-CM | POA: Diagnosis not present

## 2020-10-18 DIAGNOSIS — C763 Malignant neoplasm of pelvis: Secondary | ICD-10-CM | POA: Diagnosis not present

## 2020-10-18 DIAGNOSIS — Z923 Personal history of irradiation: Secondary | ICD-10-CM | POA: Diagnosis not present

## 2020-10-18 DIAGNOSIS — Z853 Personal history of malignant neoplasm of breast: Secondary | ICD-10-CM | POA: Diagnosis not present

## 2020-10-18 DIAGNOSIS — C786 Secondary malignant neoplasm of retroperitoneum and peritoneum: Secondary | ICD-10-CM | POA: Diagnosis not present

## 2020-10-18 DIAGNOSIS — R339 Retention of urine, unspecified: Secondary | ICD-10-CM | POA: Diagnosis not present

## 2020-10-18 DIAGNOSIS — Z9221 Personal history of antineoplastic chemotherapy: Secondary | ICD-10-CM | POA: Diagnosis not present

## 2020-10-18 DIAGNOSIS — T451X5A Adverse effect of antineoplastic and immunosuppressive drugs, initial encounter: Secondary | ICD-10-CM | POA: Diagnosis not present

## 2020-10-18 DIAGNOSIS — Z20822 Contact with and (suspected) exposure to covid-19: Secondary | ICD-10-CM | POA: Diagnosis not present

## 2020-10-18 DIAGNOSIS — E1122 Type 2 diabetes mellitus with diabetic chronic kidney disease: Secondary | ICD-10-CM | POA: Diagnosis not present

## 2020-10-18 DIAGNOSIS — C569 Malignant neoplasm of unspecified ovary: Secondary | ICD-10-CM | POA: Diagnosis not present

## 2020-10-22 ENCOUNTER — Telehealth: Payer: Self-pay | Admitting: *Deleted

## 2020-10-22 NOTE — Telephone Encounter (Signed)
Per [facility] scheduled the patient for a foley removal

## 2020-10-22 NOTE — Telephone Encounter (Signed)
Called patient on 10/22/2020 , 3:45 PM in an attempt to reach the patient for a hospital follow up.   Admit date: 07/14/20 Discharge: 07/14/20   She does not have any questions or concerns about medications from the hospital admission. The patient's medications were reviewed over the phone, they were counseled to bring in all current medications to the hospital follow up visit.   I advised the patient to call if any questions or concerns arise about the hospital admission or medications. Patient states she has taken very little Oxycodone, only 1/2 tablet twice, since discharge. She has an appointment at the [facility] tomorrow to remove her foley catheter.   Home health was not started in the hospital.  All questions were answered and a follow up appointment was made. Hospital follow up is on 11/01/2020 with Dr Melford Aase.  Prior to Admission medications   Medication Sig Start Date End Date Taking? Authorizing Provider  acetaminophen (TYLENOL) 325 MG tablet Take 325 mg by mouth every 6 (six) hours as needed for headache.    [provider]  allopurinol (ZYLOPRIM) 300 MG tablet Take 1/2 tablet Daily to Prevent Gout 05/09/20   Liane Comber, NP  blood glucose meter kit and supplies KIT Dispense based on patient and insurance preference. Use to check blood sugar once daily. Dx: E11.22, N18.30 05/09/20   Liane Comber, NP  Cholecalciferol (VITAMIN D3) 5000 units CAPS Take 5,000 Units by mouth daily.    [provider]  dexamethasone (DECADRON) 4 MG tablet Take 2 tabs at the night before and 2 tab the morning of chemotherapy, every 3 weeks, by mouth Patient not taking: Reported on 10/01/2020 04/19/20   Heath Lark, MD  gabapentin (NEURONTIN) 300 MG capsule TAKE 1 CAPSULE BY MOUTH 3 TO 4 TIMES PER DAY AS NEEDED FOR PAIN 09/21/20   [name], MD  glimepiride (AMARYL) 1 MG tablet Take 1 tab by mouth in the morning if fasting is running 150+. Please do not take this medication if  you are ill or not eating as this can cause a low blood sugar. 05/04/20   Liane Comber, NP  glucose blood (FREESTYLE TEST STRIPS) test strip Check Blood Sugar Daily as directed (Dx: E11.29) 10/28/19   [name], MD  HYDROcodone-acetaminophen (NORCO/VICODIN) 5-325 MG tablet Take 1 tablet by mouth every 6 (six) hours as needed for moderate pain. Patient not taking: Reported on 10/01/2020 05/24/20   Heath Lark, MD  Lancets (FREESTYLE) lancets CHECK BLOOD SUGAR DAILY AS DIRECTED 09/03/20   [name], MD  lidocaine-prilocaine (EMLA) cream Apply to affected area once Patient taking differently: Apply 1 application topically daily as needed (port access).  04/19/20   Heath Lark, MD  loratadine (CLARITIN) 10 MG tablet Take 10 mg by mouth daily as needed for allergies.    [provider]  Magnesium ([employer] MAGNESIUM) 250 MG TABS Take 250 mg by mouth 2 (two) times daily.    [provider]  metoprolol succinate (TOPROL-XL) 25 MG 24 hr tablet TAKE 1 TABLET BY MOUTH DAILY FOR BLOOD PRESSURE 09/18/20   Liane Comber, NP  mirabegron ER (MYRBETRIQ) 50 MG TB24 tablet Take     1 tablet    Daily    for Bladder Control 08/24/20   [name], MD  ondansetron (ZOFRAN) 8 MG tablet Take 1 tablet (8 mg total) by mouth 2 (two) times daily as needed for refractory nausea / vomiting. Start on day 3 after carboplatin chemo. Patient not taking: Reported on  05/09/2020 04/19/20   Heath Lark, MD  prochlorperazine (COMPAZINE) 10 MG tablet Take 1 tablet (10 mg total) by mouth every 6 (six) hours as needed for nausea or vomiting. Patient not taking: Reported on 05/09/2020 05/07/20   Heath Lark, MD  vitamin C (ASCORBIC ACID) 500 MG tablet Take 500 mg by mouth every evening.     [provider]

## 2020-10-23 ENCOUNTER — Other Ambulatory Visit: Payer: Self-pay

## 2020-10-23 ENCOUNTER — Other Ambulatory Visit: Payer: Self-pay | Admitting: Gynecologic Oncology

## 2020-10-23 ENCOUNTER — Inpatient Hospital Stay (HOSPITAL_BASED_OUTPATIENT_CLINIC_OR_DEPARTMENT_OTHER): Payer: PPO

## 2020-10-23 ENCOUNTER — Other Ambulatory Visit: Payer: Self-pay | Admitting: Internal Medicine

## 2020-10-23 VITALS — BP 125/66 | HR 100 | Temp 97.4°F | Resp 18

## 2020-10-23 DIAGNOSIS — C569 Malignant neoplasm of unspecified ovary: Secondary | ICD-10-CM

## 2020-10-23 DIAGNOSIS — M1000 Idiopathic gout, unspecified site: Secondary | ICD-10-CM

## 2020-10-23 DIAGNOSIS — Z7409 Other reduced mobility: Secondary | ICD-10-CM

## 2020-10-23 DIAGNOSIS — R6889 Other general symptoms and signs: Secondary | ICD-10-CM

## 2020-10-23 NOTE — Patient Instructions (Addendum)
Go to the bathroom at least every 2 1/2-3 hours while you are awake.  Urinate before you go to bed.  Drink plenty of water (6 to 8 cups) each day. Avoid or decrease caffeine. Limit what you drink after the evening meal.   If you have trouble emptying your bladder, do the following: Urinate normally then stand up and sit back down on the commode.  Try again to urinate.   Call the clinic if you have questions or have any of the following symptoms: Retention (unable to empty your bladder).  If you feel you need to urinate but can't. You are unable to urinate for more than 6 hours while awake. You notice your belly swelling. Please call our office if you have: new strong lower back pain, have chills or fever of 100.4 or greater, you have burning pain when you start or stop urinating, you have nausea or vomiting.

## 2020-10-23 NOTE — Progress Notes (Signed)
Patient here for post op foley catheter removal.  Foley patent with clear yellow urine noted in collection bag. Foley cath d/c'd per order. Withdrew 10cc of water from balloon. Catheter removed without difficulty; catheter tip intact. Patient tolerated well.  Post foley instructions provided verbally and in writing.  Patient verbalized understanding of all information provided.   Abdominal incision clean, dry and without drainage.  Patient wearing abdominal binder correctly.   Patient follow up appointment made for November 07, 2020.  Patient will call with any questions or concerns.

## 2020-10-24 ENCOUNTER — Telehealth: Payer: Self-pay

## 2020-10-24 NOTE — Telephone Encounter (Signed)
Kayla Vandenbrink called and stated that she has not had a BM since the day before surgery. She has been using Miralax 1 capful daily. She never started the senna. It is six days since BM.  Using oxycodone at HS the last couple of nights.  Not eating very much just soups.  No N/V.  She did pass some gas today.  She thought she would have a BM after the gas but did not. She feels the pressure in the lower rectum. Stool may be too hard to pass. Reviewed with Melissa Cross,NP. Told Kayla Baker to take a senna tablet now with 1 capful of Miralax and repeat the Miralax at hs.  Repeat senna and Miralax in the am.  If no BM by Midday tomorrow,then take 1/3 of a bottle of mag citrate.  Repeat 1/3 of bottle in 4 hrs if no BM. Call the office if no BM after above.

## 2020-11-01 ENCOUNTER — Other Ambulatory Visit: Payer: Self-pay

## 2020-11-01 ENCOUNTER — Ambulatory Visit (INDEPENDENT_AMBULATORY_CARE_PROVIDER_SITE_OTHER): Payer: PPO | Admitting: Internal Medicine

## 2020-11-01 ENCOUNTER — Encounter: Payer: Self-pay | Admitting: Internal Medicine

## 2020-11-01 VITALS — BP 134/68 | HR 69 | Temp 97.6°F | Ht 64.0 in | Wt 131.0 lb

## 2020-11-01 DIAGNOSIS — Z79899 Other long term (current) drug therapy: Secondary | ICD-10-CM | POA: Diagnosis not present

## 2020-11-01 DIAGNOSIS — E1122 Type 2 diabetes mellitus with diabetic chronic kidney disease: Secondary | ICD-10-CM | POA: Diagnosis not present

## 2020-11-01 DIAGNOSIS — C569 Malignant neoplasm of unspecified ovary: Secondary | ICD-10-CM | POA: Diagnosis not present

## 2020-11-01 DIAGNOSIS — N183 Chronic kidney disease, stage 3 unspecified: Secondary | ICD-10-CM

## 2020-11-01 DIAGNOSIS — C786 Secondary malignant neoplasm of retroperitoneum and peritoneum: Secondary | ICD-10-CM

## 2020-11-01 DIAGNOSIS — C800 Disseminated malignant neoplasm, unspecified: Secondary | ICD-10-CM

## 2020-11-01 NOTE — Progress Notes (Signed)
Kayla Baker     This very nice 84 y.o.  MWF was admitted to the hospital on  10/18/2020 and patient was discharged from the hospital  12 days ago on 10/20/2020. The patient now presents for follow up for transition from recent hospitalization.  The day after discharge  our clinical staff contacted the patient to assure stability and schedule a follow up appointment. The discharge summary, medications and diagnostic test results were reviewed before meeting with the patient. 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 PO course was uncomplicated & pt was discharged on the 2sd post-op day for f/u with her primary Gyn Oncologist  -  Dr Everitt Amber.      Hospitalization discharge instructions and medications are reconciled with the patient.      Patient is also followed with Hypertension, Hyperlipidemia, T2_NIDDM with CKD3a (GFR 47)and Vitamin D Deficiency.      Patient  Also has hx/o Lt Breast Ca (DCIS) March 2017 s/p Lt Lumpectomy - completed ChemoRadiation and since then on Anastrozole per Dr Lindi Adie      Patient is treated for HTN & BP has been controlled at home. Today's BP: 134/68. Patient has had no complaints of any cardiac type chest pain, palpitations, dyspnea/orthopnea/PND, dizziness, claudication, or dependent  edema.     Hyperlipidemia is controlled with diet & meds. Patient denies myalgias or other med SE's. Last Lipids were  Lab Results  Component Value Date   CHOL 274 (H) 10/03/2020   HDL 37 (L) 10/03/2020   LDLCALC 181 (H) 10/03/2020   TRIG 332 (H) 10/03/2020   CHOLHDL 7.4 (H) 10/03/2020      Also, the patient has history of T2_NIDDM w/CKD3  and has had no symptoms of reactive hypoglycemia, diabetic polys or visual blurring, but she does have painful paresthesias of feet /distal legs attributed to her Diabetes and her Chemotx (Abraxane). Patient takes Gabapentin for her Neuropathy.  Last A1c was  Lab Results  Component Value Date   HGBA1C 5.7 (H) 10/03/2020      Further, the patient also has history of Vitamin D Deficiency and supplements vitamin D without any suspected side-effects. Last vitamin D was   Lab Results  Component Value Date   VD25OH 45 10/03/2020   Current Outpatient Medications on File Prior to Visit  Medication Sig  . acetaminophen  325 MG tablet Take very 6 hours as needed for headache.  . allopurinol 300 MG tablet Take       1 tablet     Daily       to Prevent Gout  . VITAMIN D 5000 units CAPS Take 5,000 Units by mouth daily.  Marland Kitchen gabapentin300 MG capsule TAKE 1 CAP 3 TO 4 TIMES PER DAY AS NEEDED FOR PAIN  . glimepiride 1 MG tablet Take 1 tab in the morning if fasting is running 150+.  Marland Kitchen  lidocaine-prilocaine (EMLA) cream Apply to affected area once  . loratadine  10 MG tablet Take daily as needed for allergies.  . Magnesium  250 MG TABS Take 2 times daily.  . metoprolol succ-XL) 25 MG  TAKE 1 TABLET  DAILY   . MYRBETRIQ 50 MG  Take     1 tablet    Daily    for Bladder Control  . ondansetron (ZOFRAN) 8 MG tablet Take 1 tablet  2  times daily as needed   . vitamin C  500 MG tablet Take 500 mg by mouth every evening.     Allergies  Allergen Reactions  . Keflex [Cephalexin] Swelling    Tongue and throat swells  . Meloxicam Swelling  . Prednisone Swelling    Can  Not take High doses  . Premarin [Estrogens Conjugated] Hives  . Trovan [Alatrofloxacin] Other (See Comments)    Unknown reaction   PMHx:   Past Medical History:  Diagnosis Date  . Allergy   . Anemia   . Arthritis   . Blood transfusion without reported diagnosis   . Breast cancer of upper-inner quadrant of left female breast ([place]) 02/29/2016   skin- 2016- squamous- on right upper arm  . GERD (gastroesophageal reflux disease)   . Gout    takes Allopurinol daily  . History of chemotherapy   . History of colon polyps    benign  . History of shingles   . Hx of radiation therapy   . Hyperlipidemia   . Hypertension   . Ovarian ca ([place]) dx'd 03/2020  . Personal history of chemotherapy    2017  . Personal history of radiation therapy    2017  . Vitamin D deficiency    Immunization History  Administered Date(s) Administered  . DTaP 07/01/2004  . Influenza Whole 08/15/2013  . Influenza, High Dose Seasonal PF 08/28/2014, 09/20/2015, 08/19/2017, 09/01/2018, 07/28/2019  . Influenza-Unspecified 08/04/2020  . PFIZER SARS-COV-2 Vaccination 12/12/2019, 12/31/2019, 08/03/2020  . Pneumococcal Conjugate-13 09/20/2015  . Pneumococcal Polysaccharide-23 11/01/2009  . Td 08/10/2009, 04/11/2020  . Zoster 08/02/2011   Past Surgical History:  Procedure Laterality Date  . ABDOMINAL HYSTERECTOMY  1973  . BREAST BIOPSY Left 02/26/2016  . BREAST LUMPECTOMY Left 03/18/2016  . CARDIAC CATHETERIZATION     patient denies this procedure  . CATARACT EXTRACTION, BILATERAL  2014   right eye 2/14; left eye 3/3  . COLONOSCOPY    . HAND SURGERY Left 2012  . INCONTINENCE SURGERY  2006  . IR IMAGING GUIDED PORT INSERTION  04/24/2020  . IR PARACENTESIS  05/08/2020  . IR PARACENTESIS  05/16/2020  . IR PARACENTESIS  05/25/2020  . KNEE ARTHROSCOPY Right   . MASTOPEXY Right 06/16/2017   Procedure: RIGHT BREAST MASTOPEXY;  Surgeon: Irene Limbo, MD;  Location: [facility];  Service: Plastics;   Laterality: Right;  . PORT-A-CATH REMOVAL N/A 11/27/2016   Procedure: REMOVAL PORT-A-CATH;  Surgeon: Fanny Skates, MD;  Location: [facility];  Service: General;  Laterality: N/A;  . PORTACATH PLACEMENT N/A 04/14/2016   Procedure: INSERTION PORT-A-CATH ;  Surgeon: Fanny Skates, MD;  Location: [facility];  Service: General;  Laterality: N/A;  . RADIOACTIVE SEED GUIDED PARTIAL MASTECTOMY WITH AXILLARY SENTINEL LYMPH NODE BIOPSY Left 03/18/2016   Procedure: RADIOACTIVE SEED GUIDED LEFT PARTIAL MASTECTOMY WITH AXILLARY SENTINEL LYMPH NODE BIOPSY AND BLUE DYE INJECTION;  Surgeon: Fanny Skates, MD;  Location: [facility];  Service: General;  Laterality: Left;  . REDUCTION MAMMAPLASTY Right 2018   FHx:  Reviewed / unchanged  SHx:    Reviewed / unchanged  Systems Review:  Constitutional: Denies fever, chills, wt changes, headaches, insomnia, fatigue, night sweats, change in appetite. Eyes: Denies redness, blurred vision, diplopia, discharge, itchy, watery eyes.  ENT: Denies discharge, congestion, post nasal drip, epistaxis, sore throat, earache, hearing loss, dental pain, tinnitus, vertigo, sinus pain, snoring.  CV: Denies chest pain, palpitations, irregular heartbeat, syncope, dyspnea, diaphoresis, orthopnea, PND, claudication or edema. Respiratory: denies cough, dyspnea, DOE, pleurisy, hoarseness, laryngitis, wheezing.  Gastrointestinal: Denies dysphagia, odynophagia, heartburn, reflux, water brash, abdominal pain or cramps, nausea, vomiting, bloating, diarrhea, constipation, hematemesis, melena, hematochezia  or hemorrhoids. Genitourinary: Denies dysuria, frequency, urgency, nocturia, hesitancy, discharge, hematuria or flank pain. Musculoskeletal: Denies arthralgias, myalgias, stiffness, jt. swelling, pain, limping or strain/sprain.  Skin: Denies pruritus, rash, hives, warts, acne, eczema or change in skin lesion(s). Neuro: No weakness, tremor, incoordination, spasms, paresthesia or pain. Psychiatric:  Denies confusion, memory loss or sensory loss. Endo: Denies change in weight, skin or hair change.  Heme/Lymph: No excessive bleeding, bruising or enlarged lymph nodes.  Physical Exam  BP 134/68   Pulse 69   Temp 97.6 F (36.4 C)   Wt 131 lb (59.4 kg)   SpO2 99%   BMI 22.49 kg/m   Appears well nourished, well groomed  and in no distress.  Eyes: PERRLA, EOMs, conjunctiva no swelling or erythema. Sinuses: No frontal/maxillary tenderness ENT/Mouth: EAC's clear, TM's nl w/o erythema, bulging. Nares clear w/o erythema, swelling, exudates. Oropharynx clear without erythema or exudates. Oral hygiene is good. Tongue normal, non obstructing. Hearing intact.  Neck: Supple. Thyroid nl. Car 2+/2+ without bruits, nodes or JVD. Chest: Respirations nl with BS clear & equal w/o rales, rhonchi, wheezing or stridor.  Cor: Heart sounds normal w/ regular rate and rhythm without sig. murmurs, gallops, clicks or rubs. Peripheral pulses normal and equal  without edema.  Abdomen: Soft & bowel sounds normal. Non-tender w/o guarding, rebound, hernias, masses or organomegaly.  Lymphatics: Unremarkable.  Musculoskeletal: Full ROM all peripheral extremities, joint stability, 5/5 strength and normal gait.  Skin: Warm, dry without exposed rashes, lesions or ecchymosis apparent.  Neuro: Cranial nerves intact, reflexes equal bilaterally. Sensory-motor testing grossly intact. Tendon reflexes grossly intact.  Pysch: Alert & oriented x 3.  Insight and judgement nl & appropriate. No ideations.  Assessment and Plan:   1. Malignant neoplasm of ovary, unspecified laterality ([place])  - CBC with Differential/Platelet - COMPLETE METABOLIC PANEL WITH GFR  2. 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 Type 2 diabetes mellitus with stage 3a chronic kidney  disease, without long-term current use of insulin,   - Continue diet, exercise, lifestyle modifications.  - Monitor appropriate  labs. - COMPLETE METABOLIC PANEL WITH GFR  5. Medication management  - CBC with Differential/Platelet - COMPLETE METABOLIC PANEL WITH GFR        Discussed  regular exercise, BP monitoring, weight control to achieve/maintain BMI less than 25 and discussed meds and SE's. Recommended labs to assess and monitor clinical status with further disposition pending results of labs. Over 30 minutes of exam, counseling, chart review was performed.

## 2020-11-02 ENCOUNTER — Other Ambulatory Visit: Payer: Self-pay | Admitting: Hematology and Oncology

## 2020-11-02 ENCOUNTER — Telehealth: Payer: Self-pay | Admitting: Oncology

## 2020-11-02 DIAGNOSIS — D539 Nutritional anemia, unspecified: Secondary | ICD-10-CM

## 2020-11-02 HISTORY — DX: Nutritional anemia, unspecified: D53.9

## 2020-11-02 LAB — COMPLETE METABOLIC PANEL WITH GFR
AG Ratio: 1.5 (calc) (ref 1.0–2.5)
ALT: 26 U/L (ref 6–29)
AST: 31 U/L (ref 10–35)
Albumin: 4 g/dL (ref 3.6–5.1)
Alkaline phosphatase (APISO): 164 U/L — ABNORMAL HIGH (ref 37–153)
BUN/Creatinine Ratio: 20 (calc) (ref 6–22)
BUN: 23 mg/dL (ref 7–25)
CO2: 25 mmol/L (ref 20–32)
Calcium: 10 mg/dL (ref 8.6–10.4)
Chloride: 103 mmol/L (ref 98–110)
Creat: 1.17 mg/dL — ABNORMAL HIGH (ref 0.60–0.88)
GFR, Est African American: 50 mL/min/{1.73_m2} — ABNORMAL LOW (ref >=60)
GFR, Est Non African American: 43 mL/min/{1.73_m2} — ABNORMAL LOW (ref >=60)
Globulin: 2.7 g/dL (calc) (ref 1.9–3.7)
Glucose, Bld: 100 mg/dL — ABNORMAL HIGH (ref 65–99)
Potassium: 3.4 mmol/L — ABNORMAL LOW (ref 3.5–5.3)
Sodium: 143 mmol/L (ref 135–146)
Total Bilirubin: 0.3 mg/dL (ref 0.2–1.2)
Total Protein: 6.7 g/dL (ref 6.1–8.1)

## 2020-11-02 LAB — CBC WITH DIFFERENTIAL/PLATELET
Absolute Monocytes: 526 cells/uL (ref 200–950)
Basophils Absolute: 22 cells/uL (ref 0–200)
Basophils Relative: 0.4 %
Eosinophils Absolute: 230 cells/uL (ref 15–500)
Eosinophils Relative: 4.1 %
HCT: 26.6 % — ABNORMAL LOW (ref 35.0–45.0)
Hemoglobin: 8.5 g/dL — ABNORMAL LOW (ref 11.7–15.5)
Lymphs Abs: 1120 cells/uL (ref 850–3900)
MCH: 32.1 pg (ref 27.0–33.0)
MCHC: 32 g/dL (ref 32.0–36.0)
MCV: 100.4 fL — ABNORMAL HIGH (ref 80.0–100.0)
MPV: 10.1 fL (ref 7.5–12.5)
Monocytes Relative: 9.4 %
Neutro Abs: 3702 cells/uL (ref 1500–7800)
Neutrophils Relative %: 66.1 %
Platelets: 286 10*3/uL (ref 140–400)
RBC: 2.65 10*6/uL — ABNORMAL LOW (ref 3.80–5.10)
RDW: 14.5 % (ref 11.0–15.0)
Total Lymphocyte: 20 %
WBC: 5.6 10*3/uL (ref 3.8–10.8)

## 2020-11-02 NOTE — Telephone Encounter (Signed)
Called Kayla Baker and she agrees with having labs/apt with Dr. [name] on 12/15 and chemo on 12/17.  Advised her that a scheduler will call her with the appointment times.

## 2020-11-02 NOTE — Progress Notes (Signed)
========================================================== -   Test results slightly outside the reference range are not unusual. If there is anything important, I will review this with you,  otherwise it is considered normal test values.  If you have further questions,  please do not hesitate to contact me at the office or via My Chart.  ==========================================================  -  Kidney functions - Stable  - still Stage 3b  - Electrolytes show potassium is slightly low - Recommend buy   - "No Salt" at grocery store which is 100% potassium chloride &   "salt" foods heavily for extra potassium  (There is no sodium chloride in "No Salt")  ==========================================================  -  CBC / Blood Counts still show Red cell count or   Hgb (Hemoglobin) = 8.5 gm% - is low, but "stable" over the last 2-3 months.   - You may benefit from a blood transfusion  - Dr Berline Lopes may recheck CBC when you see her on Dec 8 th.   - Please call office Monday to schedule a visit in 2 weeks to recheck the labs

## 2020-11-02 NOTE — Telephone Encounter (Signed)
It's too long to wait until after christmas to resume Rx I can get her chemo scheduled for 12/17 but I cannot see her and get labs the same day I can see her on 12/15 with labs, go through plan and scehdule chemo on 12/17 If she agrees, I will place orders and scheduling msg

## 2020-11-02 NOTE — Telephone Encounter (Addendum)
Called Kayla Baker and discussed that chemotherapy is recommended 4 weeks after surgery on 10/18/20.  She would like to go ahead and have it scheduled on 11/15/20 with all her appointments in one day (labs, see Dr. [name] and infusion) if possible.

## 2020-11-02 NOTE — Telephone Encounter (Addendum)
Kayla Baker called back and said she didn't realized they have plans on 12/16.  She is wondering if everything can be scheduled on 12/17.  If not she would like to wait until after Christmas to start chemotherapy.

## 2020-11-05 ENCOUNTER — Telehealth: Payer: Self-pay | Admitting: *Deleted

## 2020-11-05 NOTE — Telephone Encounter (Signed)
Looks like 12/15 is overbooked now I will see her on 12/16 and then chemo 12/17

## 2020-11-05 NOTE — Telephone Encounter (Signed)
Returned call to patient regarding lab appointment. Patient has an upcoming appointment at the [facility] for labs and asked if it is OK to omit coming back here for a lab recheck. Per Dr Melford Aase, [facility] to wait. Patient is aware.

## 2020-11-06 ENCOUNTER — Telehealth: Payer: Self-pay | Admitting: Hematology and Oncology

## 2020-11-06 ENCOUNTER — Encounter: Payer: Self-pay | Admitting: Gynecologic Oncology

## 2020-11-06 NOTE — Progress Notes (Signed)
Gynecologic Oncology Return Clinic Visit  11/07/20  Reason for Visit: post-operative visit after IDS  Treatment History: Oncology History  Breast cancer of upper-inner quadrant of left female breast ([place])  02/26/2016 Initial Diagnosis   Left breast biopsy 11:00 position: invasive ductal carcinoma with DCIS, ER 90%, PR 10%, HER-2 negative, Ki-67 30%, grade 2, 2.2 cm palpable lesion T2 N0 stage II a clinical stage   03/18/2016 Surgery   Left lumpectomy: Invasive ductal carcinoma, grade 2, 6.3 cm, with high-grade DCIS, margins negative, 0/4 lymph nodes negative, ER 90%, via 10%, HER-2 negative ratio 0.97, Ki-67 30%, T3 N0 stage IIB   03/25/2016 Procedure   Genetic testing is negative for pathogenic mutations within any of the 20 Genes on the breast/ovarian cancer panel   04/04/2016 Oncotype testing   Oncotype DX recurrence score 37, 25% 10 year distant risk of recurrence   04/17/2016 - 07/31/2016 Chemotherapy   Adjuvant chemotherapy with dose dense Adriamycin and Cytoxan followed by Abraxane weekly 8 ( discontinued due to neuropathy)   09/01/2016 - 09/26/2016 Radiation Therapy   Adj XRT 1) Left breast: 42.5 Gy in 17 fractions. 2) Left breast boost: 7.5 Gy in 3 fractions.   12/02/2016 -  Anti-estrogen oral therapy   Anastrozole 1 mg daily   Ovarian cancer ([place])  04/03/2020 Imaging   US pelvis Complex cystic and solid mass in LEFT adnexa 12.5 cm diameter question cystic ovarian neoplasm; recommend correlation with serum tumor markers and further evaluation by MR imaging with and without contrast.     04/04/2020 Imaging   US venous Doppler No evidence of deep venous thrombosis in the right lower extremity. Left common femoral vein also patent.   04/15/2020 Imaging   MRI pelvis 1. Large complex solid and cystic mass arising from the right adnexa measuring 10.8 by 11.5 by 11.1 cm. This has an aggressive appearance with extensive enhancing mural soft tissue components. Findings are highly  suspicious for malignant ovarian neoplasm. 2. Extensive bilateral retroperitoneal and bilateral iliac adenopathy compatible with metastatic disease. 3. Signs of extensive peritoneal carcinomatosis including ascites, enhancement, thickening and nodularity of the peritoneal reflections, omental caking and bulky peritoneal nodularity. 4. Suspected serosal involvement of the dome of bladder with loss of normal fat plane. Cannot rule out mural invasion by tumor.   04/17/2020 Tumor Marker   Patient's tumor was tested for the following markers: CA-125 Results of the tumor marker test revealed 1762.   04/20/2020 Cancer Staging   Staging form: Ovary, Fallopian Tube, and Primary Peritoneal Carcinoma, AJCC 8th Edition - Clinical: FIGO Stage IIIC (cT3, cN1, cM0) - Signed by Heath Lark, MD on 04/20/2020   04/23/2020 Imaging   1. Redemonstrated dominant mixed solid and cystic mass arising from the vicinity of the right ovary measuring at least 11.5 x 10.7 cm, not significantly changed compared to prior MR, and consistent with primary ovarian malignancy.  2. Numerous bulky retroperitoneal, bilateral iliac, and pelvic sidewall lymph nodes. 3. Moderate volume ascites throughout the abdomen and pelvis with subtle thickening and nodularity throughout the peritoneum, and extensive bulky nodular metastatic disease of the omentum. 4. Constellation of findings is consistent with advanced nodal and peritoneal metastatic disease. 5. There are prominent subcentimeter epicardial lymph nodes, nonspecific although suspicious for nodal metastatic disease. No definite nodal metastatic disease in the chest. Attention on follow-up. 6. There are multiple small subpleural nodules at the right lung base overlying the diaphragm, measuring up to 7 mm. These are generally nonspecific and less favored to represent pulmonary  metastatic disease given distribution. Attention on follow-up. 7. Somewhat coarse contour of the liver, suggestive  of cirrhosis, although out without overt morphologic stigmata. 8. Aortic Atherosclerosis (ICD10-I70.0).     04/24/2020 Procedure   Successful placement of a right internal jugular approach power injectable Port-A-Cath. The catheter is ready for immediate use.     05/02/2020 Tumor Marker   Patient's tumor was tested for the following markers: CA-125 Results of the tumor marker test revealed 1921   05/03/2020 -  Chemotherapy   The patient had carboplatin and taxol for chemotherapy treatment.     05/08/2020 Procedure   Successful ultrasound-guided paracentesis yielding 2.6 liters of peritoneal fluid.   05/16/2020 Procedure   Successful ultrasound-guided paracentesis yielding 3.2 liters of peritoneal fluid   05/25/2020 Procedure   Successful ultrasound-guided paracentesis yielding 3 liters of peritoneal fluid.     06/01/2020 Procedure   Successful ultrasound-guided therapeutic paracentesis yielding 1.7 liters of peritoneal fluid.   06/14/2020 Tumor Marker   Patient's tumor was tested for the following markers: CA-125 Results of the tumor marker test revealed 1309   06/20/2020 Imaging   1. Dominant mixed cystic and solid mass in the central pelvis is stable in the interval. 2. Clear interval decrease in retroperitoneal and pelvic sidewall lymphadenopathy. The bulky omental disease has also clearly decreased in the interval. 3. Interval decrease in ascites. 4. Stable 8 mm subpleural nodule along the diaphragm. 5. Aortic Atherosclerosis (ICD10-I70.0).   07/10/2020 Tumor Marker   Patient's tumor was tested for the following markers: CA-125 Results of the tumor marker test revealed 220   08/03/2020 Tumor Marker   Patient's tumor was tested for the following markers: CA-125 Results of the tumor marker test revealed 53.3.   09/03/2020 Tumor Marker   Patient's tumor was tested for the following markers: CA-125 Results of the tumor marker test revealed 29.7   09/20/2020 Imaging   1.  Substantial improvement. The right ovarian cystic and solid mass is half the volume that it measured on 06/20/2020. Similar reduction in the bulk of the omental caking of tumor. Prior ascites is resolved and the retroperitoneal adenopathy is markedly improved. 2. Other imaging findings of potential clinical significance: Stable pleural-based nodularity along the right hemidiaphragm measuring about 1.1 by 0.7 by 0.3 cm. Stable hypodense lesion of the right kidney upper pole, probably a cyst although the configuration of this lesion makes it difficult to obtain accurate density measurements. Left ovarian cyst, stable. Chondrocalcinosis involving the acetabular labra. Mild lumbar spondylosis and degenerative disc disease. 3. Aortic atherosclerosis.     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 (stage ypT3c), predominantly viable with focal fibrosis and hemosiderin laden macrophages (possible mild treatment effect)  B: Ovary and fallopian tube, right, salpingo-oophorectomy - High grade serous carcinoma involving right ovary and fallopian tube with surface involvement, size up to 8 cm  - Areas of necrosis and hemosiderin laden macrophages suggestive of treatment effect - 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 - See synoptic report and comment  C: Ovary and fallopian tube, left, salpingo-oophorectomy - High grade serous carcinoma involving the left fallopian tube, size up to 0.5 cm - Ovary with no definite involvement by carcinoma identified - Nodular area of endometriosis and peritoneal inclusion cyst also present - See synoptic report and comment  Procedure:    Bilateral salpingo-oophorectomy    Procedure:    Omentectomy     Specimen Integrity of Right Ovary:    Intact with surface involvement by tumor    Specimen Integrity of Left Ovary:    Capsule intact   TUMOR Tumor Site:    Right fallopian tube: favor as primary site; also involves right ovary and left fallopian tube  Histologic Type:    Serous carcinoma  Histologic Grade:    High grade  Tumor Size:    Greatest Dimension (Centimeters): in fallopian tube: 1.0 cm; in ovary: 8.0 cm Ovarian Surface Involvement:    Present    Laterality:    Right  Fallopian Tube Surface Involvement:    Present    Laterality:    Bilateral  Other Tissue / Organ Involvement:    Right ovary  Other Tissue / Organ Involvement:    Right fallopian tube  Other Tissue / Organ Involvement:    Left fallopian tube  Other Tissue / Organ Involvement:    Omentum  Largest Extrapelvic Peritoneal Focus:    Macroscopic (greater than 2 cm)  Peritoneal / Ascitic Fluid:    Not submitted / unknown  Pleural Fluid:    Not submitted / unknown  Treatment Effect:    No definite or minimal response identified (chemotherapy response score [CRS] 1)   LYMPH NODES Regional Lymph Nodes:    No lymph nodes submitted or found   PATHOLOGIC STAGE CLASSIFICATION (pTNM, AJCC 8th Edition) TNM Descriptors:    y (post-treatment)  Primary Tumor (pT):    pT3c  Regional Lymph Nodes (pN):    pNX   FIGO STAGE FIGO Stage:    IIIC   ADDITIONAL FINDINGS Additional Findings:    Serous tubal intraepithelial carcinoma (STIC)  Additional Findings:    Left ovary with no definite carcinoma identified, left-sided nodule of endometriosis and peritoneal inclusion cyst    10/18/2020 - 10/20/2020 Hospital Admission   She was admitted to [facility] for interval debulking surgery     Interval History: Patient presents today for postoperative follow-up.  Overall, she is doing well after surgery.  She endorses a good appetite without nausea or emesis.  She is off of pain medication.  She is using MiraLAX and senna daily for  bowel function.  She denies any urinary symptoms and has not had any difficulty emptying her bladder since Foley catheter was removed after surgery.  She is scheduled to see Dr. [name] next week to resume chemotherapy.  Past Medical/Surgical History: Past Medical History:  Diagnosis Date  . Allergy   . Anemia   . Arthritis   . Blood transfusion without reported diagnosis   . Breast cancer of upper-inner quadrant of left female breast ([place]) 02/29/2016   skin- 2016- squamous- on right upper arm  . GERD (gastroesophageal reflux disease)   . Gout    takes Allopurinol daily  . History of chemotherapy   . History of colon polyps    benign  . History of shingles   . Hx of radiation therapy   . Hyperlipidemia   . Hypertension   . Ovarian ca ([place]) dx'd 03/2020  . Personal history of chemotherapy    2017  . Personal history of radiation therapy    2017  . Vitamin D deficiency     Past Surgical History:  Procedure Laterality Date  . ABDOMINAL HYSTERECTOMY  1973  . BREAST BIOPSY Left 02/26/2016  . BREAST LUMPECTOMY Left 03/18/2016  . CARDIAC CATHETERIZATION  patient denies this procedure  . CATARACT EXTRACTION, BILATERAL  2014   right eye 2/14; left eye 3/3  . COLONOSCOPY    . HAND SURGERY Left 2012  . INCONTINENCE SURGERY  2006  . IR IMAGING GUIDED PORT INSERTION  04/24/2020  . IR PARACENTESIS  05/08/2020  . IR PARACENTESIS  05/16/2020  . IR PARACENTESIS  05/25/2020  . KNEE ARTHROSCOPY Right   . MASTOPEXY Right 06/16/2017   Procedure: RIGHT BREAST MASTOPEXY;  Surgeon: Irene Limbo, MD;  Location: [facility];  Service: Plastics;  Laterality: Right;  . PORT-A-CATH REMOVAL N/A 11/27/2016   Procedure: REMOVAL PORT-A-CATH;  Surgeon: Fanny Skates, MD;  Location: [facility];  Service: General;  Laterality: N/A;  . PORTACATH PLACEMENT N/A 04/14/2016   Procedure: INSERTION PORT-A-CATH ;  Surgeon: Fanny Skates, MD;  Location: [facility];  Service: General;  Laterality: N/A;   . RADIOACTIVE SEED GUIDED PARTIAL MASTECTOMY WITH AXILLARY SENTINEL LYMPH NODE BIOPSY Left 03/18/2016   Procedure: RADIOACTIVE SEED GUIDED LEFT PARTIAL MASTECTOMY WITH AXILLARY SENTINEL LYMPH NODE BIOPSY AND BLUE DYE INJECTION;  Surgeon: Fanny Skates, MD;  Location: [facility];  Service: General;  Laterality: Left;  . REDUCTION MAMMAPLASTY Right 2018    Family History  Problem Relation Age of Onset  . Stroke Mother 60  . Other Mother        history of hysterectomy after last childbirth  . Diabetes Father   . Alzheimer's disease Father   . Hypertension Sister   . Lung cancer Sister 59       smoker  . Other Sister        history of hysterectomy for fibroids  . Breast cancer Sister 43  . Esophageal cancer Maternal Aunt 74       not a smoker  . Heart attack Maternal Uncle 65  . Cirrhosis Sister   . Lung cancer Other        nephew dx. 31s; +smoker  . Epilepsy Other   . Other Other 12       great niece dx. benign ganglioglioma brain tumor; treated at [facility]  . Epilepsy Other        no seizures in 2 years  . Parkinson's disease Maternal Aunt   . Stroke Maternal Grandmother   . Heart Problems Maternal Grandmother   . Pancreatic cancer Cousin 62       paternal 1st cousin  . Colon cancer Neg Hx   . Stomach cancer Neg Hx   . Colon polyps Neg Hx   . Ulcerative colitis Neg Hx     Social History   Socioeconomic History  . Marital status: Married    Spouse name: [name]  . Number of children: 0  . Years of education: Not on file  . Highest education level: Not on file  Occupational History  . Not on file  Tobacco Use  . Smoking status: Never Smoker  . Smokeless tobacco: Never Used  Vaping Use  . Vaping Use: Never used  Substance and Sexual Activity  . Alcohol use: No  . Drug use: No  . Sexual activity: Not on file  Other Topics Concern  . Not on file  Social History Narrative  . Not on file   Social Determinants of Health   Financial Resource Strain:   . Difficulty of  Paying Living Expenses: Not on file  Food Insecurity:   . Worried About Charity fundraiser in the Last Year: Not on file  . Ran Out of  Food in the Last Year: Not on file  Transportation Needs:   . Lack of Transportation (Medical): Not on file  . Lack of Transportation (Non-Medical): Not on file  Physical Activity:   . Days of Exercise per Week: Not on file  . Minutes of Exercise per Session: Not on file  Stress:   . Feeling of Stress : Not on file  Social Connections:   . Frequency of Communication with Friends and Family: Not on file  . Frequency of Social Gatherings with Friends and Family: Not on file  . Attends Religious Services: Not on file  . Active Member of Clubs or Organizations: Not on file  . Attends Archivist Meetings: Not on file  . Marital Status: Not on file    Current Medications:  Current Outpatient Medications:  .  acetaminophen (TYLENOL) 325 MG tablet, Take 325 mg by mouth every 6 (six) hours as needed for headache., Disp: , Rfl:  .  allopurinol (ZYLOPRIM) 300 MG tablet, Take       1 tablet     Daily       to Prevent Gout, Disp: 90 tablet, Rfl: 0 .  blood glucose meter kit and supplies KIT, Dispense based on patient and insurance preference. Use to check blood sugar once daily. Dx: E11.22, N18.30, Disp: 1 each, Rfl: 12 .  Cholecalciferol (VITAMIN D3) 5000 units CAPS, Take 5,000 Units by mouth daily., Disp: , Rfl:  .  cyanocobalamin 1000 MCG tablet, , Disp: , Rfl:  .  enoxaparin (LOVENOX) 30 MG/0.3ML injection, daily., Disp: , Rfl:  .  ezetimibe (ZETIA) 10 MG tablet, Take 10 mg by mouth daily., Disp: , Rfl:  .  gabapentin (NEURONTIN) 300 MG capsule, TAKE 1 CAPSULE BY MOUTH 3 TO 4 TIMES PER DAY AS NEEDED FOR PAIN, Disp: 360 capsule, Rfl: 3 .  glimepiride (AMARYL) 1 MG tablet, Take 1 tab by mouth in the morning if fasting is running 150+. Please do not take this medication if you are ill or not eating as this can cause a low blood sugar., Disp: 90 tablet,  Rfl: 1 .  glucose blood (FREESTYLE TEST STRIPS) test strip, Check Blood Sugar Daily as directed (Dx: E11.29), Disp: 100 strip, Rfl: 3 .  hydrochlorothiazide (HYDRODIURIL) 25 MG tablet, Take 25 mg by mouth daily., Disp: , Rfl:  .  Lancets (FREESTYLE) lancets, CHECK BLOOD SUGAR DAILY AS DIRECTED, Disp: 100 each, Rfl: 3 .  lidocaine-prilocaine (EMLA) cream, Apply to affected area once (Patient taking differently: Apply 1 application topically daily as needed (port access). ), Disp: 30 g, Rfl: 3 .  loratadine (CLARITIN) 10 MG tablet, Take 10 mg by mouth daily as needed for allergies., Disp: , Rfl:  .  Magnesium ([employer] MAGNESIUM) 250 MG TABS, Take 250 mg by mouth 2 (two) times daily., Disp: , Rfl:  .  metoprolol succinate (TOPROL-XL) 25 MG 24 hr tablet, TAKE 1 TABLET BY MOUTH DAILY FOR BLOOD PRESSURE, Disp: 90 tablet, Rfl: 3 .  oxyCODONE (OXY IR/ROXICODONE) 5 MG immediate release tablet, Take 1 tablet by mouth every 6 (six) hours as needed., Disp: , Rfl:  .  polyethylene glycol powder (GLYCOLAX/MIRALAX) 17 GM/SCOOP powder, Take by mouth daily as needed., Disp: , Rfl:  .  senna-docusate (SENOKOT-S) 8.6-50 MG tablet, Take 1 tablet by mouth at bedtime., Disp: , Rfl:  .  vitamin C (ASCORBIC ACID) 500 MG tablet, Take 500 mg by mouth every evening. , Disp: , Rfl:  .  dexamethasone (DECADRON) 4  MG tablet, Take 2 tabs at the night before and 2 tab the morning of chemotherapy, every 3 weeks, by mouth (Patient not taking: Reported on 10/01/2020), Disp: 36 tablet, Rfl: 5 .  ondansetron (ZOFRAN) 8 MG tablet, Take 1 tablet (8 mg total) by mouth 2 (two) times daily as needed for refractory nausea / vomiting. Start on day 3 after carboplatin chemo. (Patient not taking: Reported on 05/09/2020), Disp: 30 tablet, Rfl: 1 .  prochlorperazine (COMPAZINE) 10 MG tablet, Take 1 tablet (10 mg total) by mouth every 6 (six) hours as needed for nausea or vomiting. (Patient not taking: Reported on 05/09/2020), Disp: 60 tablet, Rfl:  9  Review of Systems: + Fatigue, ringing in ears Denies appetite changes, fevers, chills, unexplained weight changes. Denies hearing loss, neck lumps or masses, mouth sores, ringing in ears or voice changes. Denies cough or wheezing.  Denies shortness of breath. Denies chest pain or palpitations. Denies leg swelling. Denies abdominal distention, pain, blood in stools, constipation, diarrhea, nausea, vomiting, or early satiety. Denies pain with intercourse, dysuria, frequency, hematuria or incontinence. Denies hot flashes, pelvic pain, vaginal bleeding or vaginal discharge.   Denies joint pain, back pain or muscle pain/cramps. Denies itching, rash, or wounds. Denies dizziness, headaches, numbness or seizures. Denies swollen lymph nodes or glands, denies easy bruising or bleeding. Denies anxiety, depression, confusion, or decreased concentration.  Physical Exam: BP 124/60 (BP Location: Right Arm, Patient Position: Sitting)   Pulse 88   Temp 97.8 F (36.6 C) (Tympanic)   Resp 20   Ht 5' 3"  (1.6 m)   Wt 135 lb (61.2 kg)   SpO2 98% Comment: RA  BMI 23.91 kg/m  General: Alert, oriented, no acute distress. HEENT: Atraumatic, normocephalic, sclera anicteric. Chest: Unlabored breathing on room air. Abdomen: soft, nontender.  Normoactive bowel sounds.  No masses or hepatosplenomegaly appreciated.  Well-healing midline laparotomy, remaining Dermabond removed today. Extremities: Grossly normal range of motion.  Warm, well perfused.  No edema bilaterally. Skin: No rashes or lesions noted. GU: Normal appearing external genitalia without erythema, excoriation, or lesions.  Bimanual exam reveals cuff intact, no nodularity or tenderness.  Laboratory & Radiologic Studies: CBC    Component Value Date/Time   WBC 5.6 11/01/2020 1032   RBC 2.65 (L) 11/01/2020 1032   HGB 8.5 (L) 11/01/2020 1032   HGB 9.0 (L) 10/01/2020 1202   HGB 12.9 11/06/2016 1102   HCT 26.6 (L) 11/01/2020 1032   HCT 38.0  11/06/2016 1102   PLT 286 11/01/2020 1032   PLT 130 (L) 10/01/2020 1202   PLT 89 (L) 11/06/2016 1102   MCV 100.4 (H) 11/01/2020 1032   MCV 90.0 11/06/2016 1102   MCH 32.1 11/01/2020 1032   MCHC 32.0 11/01/2020 1032   RDW 14.5 11/01/2020 1032   RDW 14.1 11/06/2016 1102   LYMPHSABS 1,120 11/01/2020 1032   LYMPHSABS 1.6 11/06/2016 1102   MONOABS 0.5 09/19/2020 0948   MONOABS 0.5 11/06/2016 1102   EOSABS 230 11/01/2020 1032   EOSABS 0.2 11/06/2016 1102   BASOSABS 22 11/01/2020 1032   BASOSABS 0.0 11/06/2016 1102   BMP Latest Ref Rng & Units 11/01/2020 10/01/2020 09/19/2020  Glucose 65 - 99 mg/dL 100(H) 107(H) 98  BUN 7 - 25 mg/dL 23 25(H) 24(H)  Creatinine 0.60 - 0.88 mg/dL 1.17(H) 1.17(H) 1.18(H)  BUN/Creat Ratio 6 - 22 (calc) 20 - -  Sodium 135 - 146 mmol/L 143 141 139  Potassium 3.5 - 5.3 mmol/L 3.4(L) 3.6 3.7  Chloride 98 - 110  mmol/L 103 106 106  CO2 20 - 32 mmol/L 25 28 28   Calcium 8.6 - 10.4 mg/dL 10.0 9.9 10.3    Assessment & Plan: Kayla Baker is a 84 y.o. 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  Patient is doing very well from a postoperative standpoint and meeting milestones.  As long as her labs allow, I think she can restart chemotherapy next week.  We discussed the importance of several cycles of consolidation therapy after her surgery especially given the amount of tumor burden seen in pathology from her recent surgery.  I would recommend an additional 2-3 cycles of carboplatin Taxol.  She had genetic testing back in 2017 with her breast cancer diagnosis.  I think it would be worth at least doing genomic testing on her tumor to see if she has had somatic BRCA mutation.  In terms of surveillance, we discussed alternating visits with myself and Dr. [name] after she finishes systemic therapy.  We will plan on visits every 3 months to include an exam and CA-125.  I will see her back  after completion of upfront treatment.  Since her surgery was at [facility], I will reach out to inquire about tumor testing.  25 minutes of total time was spent for this patient encounter, including preparation, face-to-face counseling with the patient and coordination of care, and documentation of the encounter.  Jeral Pinch, MD  Division of Gynecologic Oncology  Department of Obstetrics and Gynecology  [facility]

## 2020-11-06 NOTE — Telephone Encounter (Signed)
Scheduled appt per 12/6 sch msg - pt is aware of appt on 12/20

## 2020-11-06 NOTE — Telephone Encounter (Signed)
Scheduled appt per 12/06 sch msg -pt is aware of appt date and time

## 2020-11-07 ENCOUNTER — Other Ambulatory Visit: Payer: Self-pay

## 2020-11-07 ENCOUNTER — Encounter: Payer: Self-pay | Admitting: Gynecologic Oncology

## 2020-11-07 ENCOUNTER — Inpatient Hospital Stay: Payer: PPO | Attending: Gynecologic Oncology | Admitting: Gynecologic Oncology

## 2020-11-07 VITALS — BP 124/60 | HR 88 | Temp 97.8°F | Resp 20 | Ht 63.0 in | Wt 135.0 lb

## 2020-11-07 DIAGNOSIS — Z79811 Long term (current) use of aromatase inhibitors: Secondary | ICD-10-CM | POA: Insufficient documentation

## 2020-11-07 DIAGNOSIS — G620 Drug-induced polyneuropathy: Secondary | ICD-10-CM | POA: Insufficient documentation

## 2020-11-07 DIAGNOSIS — Z90721 Acquired absence of ovaries, unilateral: Secondary | ICD-10-CM | POA: Insufficient documentation

## 2020-11-07 DIAGNOSIS — C50212 Malignant neoplasm of upper-inner quadrant of left female breast: Secondary | ICD-10-CM | POA: Insufficient documentation

## 2020-11-07 DIAGNOSIS — C569 Malignant neoplasm of unspecified ovary: Secondary | ICD-10-CM

## 2020-11-07 DIAGNOSIS — C7963 Secondary malignant neoplasm of bilateral ovaries: Secondary | ICD-10-CM

## 2020-11-07 DIAGNOSIS — C5701 Malignant neoplasm of right fallopian tube: Secondary | ICD-10-CM

## 2020-11-07 DIAGNOSIS — Z170 Estrogen receptor positive status [ER+]: Secondary | ICD-10-CM | POA: Insufficient documentation

## 2020-11-07 DIAGNOSIS — Z923 Personal history of irradiation: Secondary | ICD-10-CM | POA: Insufficient documentation

## 2020-11-07 DIAGNOSIS — D61818 Other pancytopenia: Secondary | ICD-10-CM | POA: Insufficient documentation

## 2020-11-07 DIAGNOSIS — C786 Secondary malignant neoplasm of retroperitoneum and peritoneum: Secondary | ICD-10-CM

## 2020-11-07 DIAGNOSIS — Z79899 Other long term (current) drug therapy: Secondary | ICD-10-CM | POA: Insufficient documentation

## 2020-11-07 DIAGNOSIS — Z9221 Personal history of antineoplastic chemotherapy: Secondary | ICD-10-CM | POA: Insufficient documentation

## 2020-11-07 DIAGNOSIS — C561 Malignant neoplasm of right ovary: Secondary | ICD-10-CM | POA: Insufficient documentation

## 2020-11-07 DIAGNOSIS — T451X5A Adverse effect of antineoplastic and immunosuppressive drugs, initial encounter: Secondary | ICD-10-CM | POA: Insufficient documentation

## 2020-11-07 NOTE — Patient Instructions (Signed)
You are healing very well after surgery!  You will resume chemotherapy with Dr. [name] next week.  We will plan to alternate visits after you finish treatment.  Please call the clinic if you need anything.

## 2020-11-15 ENCOUNTER — Other Ambulatory Visit: Payer: PPO

## 2020-11-15 ENCOUNTER — Ambulatory Visit: Payer: PPO | Admitting: Hematology and Oncology

## 2020-11-16 ENCOUNTER — Ambulatory Visit: Payer: PPO

## 2020-11-19 ENCOUNTER — Other Ambulatory Visit: Payer: Self-pay

## 2020-11-19 ENCOUNTER — Telehealth: Payer: Self-pay | Admitting: Hematology and Oncology

## 2020-11-19 ENCOUNTER — Inpatient Hospital Stay: Payer: PPO

## 2020-11-19 ENCOUNTER — Inpatient Hospital Stay: Payer: PPO | Admitting: Hematology and Oncology

## 2020-11-19 ENCOUNTER — Other Ambulatory Visit: Payer: Self-pay | Admitting: Hematology and Oncology

## 2020-11-19 ENCOUNTER — Encounter: Payer: Self-pay | Admitting: Hematology and Oncology

## 2020-11-19 DIAGNOSIS — T451X5A Adverse effect of antineoplastic and immunosuppressive drugs, initial encounter: Secondary | ICD-10-CM | POA: Diagnosis not present

## 2020-11-19 DIAGNOSIS — C569 Malignant neoplasm of unspecified ovary: Secondary | ICD-10-CM

## 2020-11-19 DIAGNOSIS — Z9221 Personal history of antineoplastic chemotherapy: Secondary | ICD-10-CM | POA: Diagnosis not present

## 2020-11-19 DIAGNOSIS — D630 Anemia in neoplastic disease: Secondary | ICD-10-CM

## 2020-11-19 DIAGNOSIS — C50212 Malignant neoplasm of upper-inner quadrant of left female breast: Secondary | ICD-10-CM | POA: Diagnosis not present

## 2020-11-19 DIAGNOSIS — G620 Drug-induced polyneuropathy: Secondary | ICD-10-CM | POA: Diagnosis not present

## 2020-11-19 DIAGNOSIS — Z923 Personal history of irradiation: Secondary | ICD-10-CM | POA: Diagnosis not present

## 2020-11-19 DIAGNOSIS — D61818 Other pancytopenia: Secondary | ICD-10-CM | POA: Diagnosis not present

## 2020-11-19 DIAGNOSIS — Z170 Estrogen receptor positive status [ER+]: Secondary | ICD-10-CM

## 2020-11-19 DIAGNOSIS — D539 Nutritional anemia, unspecified: Secondary | ICD-10-CM

## 2020-11-19 DIAGNOSIS — Z90721 Acquired absence of ovaries, unilateral: Secondary | ICD-10-CM | POA: Diagnosis not present

## 2020-11-19 DIAGNOSIS — C561 Malignant neoplasm of right ovary: Secondary | ICD-10-CM

## 2020-11-19 DIAGNOSIS — C786 Secondary malignant neoplasm of retroperitoneum and peritoneum: Secondary | ICD-10-CM | POA: Diagnosis not present

## 2020-11-19 DIAGNOSIS — Z79811 Long term (current) use of aromatase inhibitors: Secondary | ICD-10-CM | POA: Diagnosis not present

## 2020-11-19 DIAGNOSIS — Z79899 Other long term (current) drug therapy: Secondary | ICD-10-CM | POA: Diagnosis not present

## 2020-11-19 LAB — CMP (CANCER CENTER ONLY)
ALT: 46 U/L — ABNORMAL HIGH (ref 0–44)
AST: 34 U/L (ref 15–41)
Albumin: 3.7 g/dL (ref 3.5–5.0)
Alkaline Phosphatase: 145 U/L — ABNORMAL HIGH (ref 38–126)
Anion gap: 11 (ref 5–15)
BUN: 28 mg/dL — ABNORMAL HIGH (ref 8–23)
CO2: 25 mmol/L (ref 22–32)
Calcium: 10.4 mg/dL — ABNORMAL HIGH (ref 8.9–10.3)
Chloride: 105 mmol/L (ref 98–111)
Creatinine: 1.32 mg/dL — ABNORMAL HIGH (ref 0.44–1.00)
GFR, Estimated: 40 mL/min — ABNORMAL LOW (ref >60)
Glucose, Bld: 186 mg/dL — ABNORMAL HIGH (ref 70–99)
Potassium: 4 mmol/L (ref 3.5–5.1)
Sodium: 141 mmol/L (ref 135–145)
Total Bilirubin: 0.3 mg/dL (ref 0.3–1.2)
Total Protein: 7.3 g/dL (ref 6.5–8.1)

## 2020-11-19 LAB — CBC WITH DIFFERENTIAL (CANCER CENTER ONLY)
Abs Immature Granulocytes: 0.05 10*3/uL (ref 0.00–0.07)
Basophils Absolute: 0 10*3/uL (ref 0.0–0.1)
Basophils Relative: 0 %
Eosinophils Absolute: 0 10*3/uL (ref 0.0–0.5)
Eosinophils Relative: 0 %
HCT: 30.2 % — ABNORMAL LOW (ref 36.0–46.0)
Hemoglobin: 9.9 g/dL — ABNORMAL LOW (ref 12.0–15.0)
Immature Granulocytes: 1 %
Lymphocytes Relative: 13 %
Lymphs Abs: 0.8 10*3/uL (ref 0.7–4.0)
MCH: 31.1 pg (ref 26.0–34.0)
MCHC: 32.8 g/dL (ref 30.0–36.0)
MCV: 95 fL (ref 80.0–100.0)
Monocytes Absolute: 0 10*3/uL — ABNORMAL LOW (ref 0.1–1.0)
Monocytes Relative: 1 %
Neutro Abs: 4.8 10*3/uL (ref 1.7–7.7)
Neutrophils Relative %: 85 %
Platelet Count: 155 10*3/uL (ref 150–400)
RBC: 3.18 MIL/uL — ABNORMAL LOW (ref 3.87–5.11)
RDW: 14.5 % (ref 11.5–15.5)
WBC Count: 5.6 10*3/uL (ref 4.0–10.5)
nRBC: 0 % (ref 0.0–0.2)

## 2020-11-19 LAB — IRON AND TIBC
Iron: 60 ug/dL (ref 41–142)
Saturation Ratios: 17 % — ABNORMAL LOW (ref 21–57)
TIBC: 348 ug/dL (ref 236–444)
UIBC: 288 ug/dL (ref 120–384)

## 2020-11-19 LAB — SAMPLE TO BLOOD BANK

## 2020-11-19 LAB — FERRITIN: Ferritin: 165 ng/mL (ref 11–307)

## 2020-11-19 LAB — VITAMIN B12: Vitamin B-12: 739 pg/mL (ref 180–914)

## 2020-11-19 MED ORDER — SODIUM CHLORIDE 0.9 % IV SOLN
105.0000 mg/m2 | Freq: Once | INTRAVENOUS | Status: AC
  Administered 2020-11-19: 10:00:00 174 mg via INTRAVENOUS
  Filled 2020-11-19: qty 29

## 2020-11-19 MED ORDER — DIPHENHYDRAMINE HCL 50 MG/ML IJ SOLN
50.0000 mg | Freq: Once | INTRAMUSCULAR | Status: AC
  Administered 2020-11-19: 09:00:00 50 mg via INTRAVENOUS

## 2020-11-19 MED ORDER — SODIUM CHLORIDE 0.9% FLUSH
10.0000 mL | INTRAVENOUS | Status: DC | PRN
  Administered 2020-11-19: 14:00:00 10 mL
  Filled 2020-11-19: qty 10

## 2020-11-19 MED ORDER — SODIUM CHLORIDE 0.9 % IV SOLN
Freq: Once | INTRAVENOUS | Status: AC
  Filled 2020-11-19: qty 250

## 2020-11-19 MED ORDER — SODIUM CHLORIDE 0.9 % IV SOLN
10.0000 mg | Freq: Once | INTRAVENOUS | Status: AC
  Administered 2020-11-19: 09:00:00 10 mg via INTRAVENOUS
  Filled 2020-11-19: qty 10

## 2020-11-19 MED ORDER — HEPARIN SOD (PORK) LOCK FLUSH 100 UNIT/ML IV SOLN
500.0000 [IU] | Freq: Once | INTRAVENOUS | Status: AC | PRN
Start: 2020-11-19 — End: 2020-11-19
  Administered 2020-11-19: 14:00:00 500 [IU]
  Filled 2020-11-19: qty 5

## 2020-11-19 MED ORDER — FOSAPREPITANT DIMEGLUMINE INJECTION 150 MG
150.0000 mg | Freq: Once | INTRAVENOUS | Status: AC
  Administered 2020-11-19: 10:00:00 150 mg via INTRAVENOUS
  Filled 2020-11-19: qty 150

## 2020-11-19 MED ORDER — SODIUM CHLORIDE 0.9 % IV SOLN
250.0000 mg | Freq: Once | INTRAVENOUS | Status: AC
  Administered 2020-11-19: 14:00:00 250 mg via INTRAVENOUS
  Filled 2020-11-19: qty 25

## 2020-11-19 MED ORDER — PALONOSETRON HCL INJECTION 0.25 MG/5ML
0.2500 mg | Freq: Once | INTRAVENOUS | Status: AC
  Administered 2020-11-19: 09:00:00 0.25 mg via INTRAVENOUS

## 2020-11-19 MED ORDER — DIPHENHYDRAMINE HCL 50 MG/ML IJ SOLN
INTRAMUSCULAR | Status: AC
  Filled 2020-11-19: qty 1

## 2020-11-19 MED ORDER — FAMOTIDINE IN NACL 20-0.9 MG/50ML-% IV SOLN
INTRAVENOUS | Status: AC
  Filled 2020-11-19: qty 50

## 2020-11-19 MED ORDER — PALONOSETRON HCL INJECTION 0.25 MG/5ML
INTRAVENOUS | Status: AC
  Filled 2020-11-19: qty 5

## 2020-11-19 MED ORDER — FAMOTIDINE IN NACL 20-0.9 MG/50ML-% IV SOLN
20.0000 mg | Freq: Once | INTRAVENOUS | Status: AC
  Administered 2020-11-19: 09:00:00 20 mg via INTRAVENOUS

## 2020-11-19 NOTE — Patient Instructions (Signed)
[facility] Discharge Instructions for Patients Receiving Chemotherapy  Today you received the following chemotherapy agents: Taxol and Carboplatin  To help prevent nausea and vomiting after your treatment, we encourage you to take your nausea medication  as prescribed.    If you develop nausea and vomiting that is not controlled by your nausea medication, call the clinic.   BELOW ARE SYMPTOMS THAT SHOULD BE REPORTED IMMEDIATELY:  *FEVER GREATER THAN 100.5 F  *CHILLS WITH OR WITHOUT FEVER  NAUSEA AND VOMITING THAT IS NOT CONTROLLED WITH YOUR NAUSEA MEDICATION  *UNUSUAL SHORTNESS OF BREATH  *UNUSUAL BRUISING OR BLEEDING  TENDERNESS IN MOUTH AND THROAT WITH OR WITHOUT PRESENCE OF ULCERS  *URINARY PROBLEMS  *BOWEL PROBLEMS  UNUSUAL RASH Items with * indicate a potential emergency and should be followed up as soon as possible.  Feel free to call the clinic should you have any questions or concerns. The clinic phone number is [phone].  Please show the [facility] at check-in to the Emergency Department and triage nurse.

## 2020-11-19 NOTE — Assessment & Plan Note (Signed)
She have history of severe pancytopenia due to treatment I anticipate she will continue to have major pancytopenia I plan to reduce carboplatin to AUC of 4.5 and continue on reduced dose Taxol due to peripheral neuropathy

## 2020-11-19 NOTE — Progress Notes (Signed)
[place] OFFICE PROGRESS NOTE  Patient Care Team: [name], MD as PCP - Onalee Hua, Wille Glaser, OD as Referring Physician (Optometry) [name], MD as Consulting Physician (Dermatology) Latanya Maudlin, MD as Consulting Physician (Orthopedic Surgery) Inda Castle, MD (Inactive) as Consulting Physician (Gastroenterology) Jacqulyn Liner, RN as Oncology Nurse Navigator (Oncology) Heath Lark, MD as Consulting Physician (Hematology and Oncology)  ASSESSMENT & PLAN:  Ovarian cancer [facility]) I have reviewed her outside records extensively and summarize her oncologic history She have excellent response to treatment I will get our GYN navigator to make sure that germline mutation testing was done on her pathology We will continue 2-3 more cycles of carboplatin and paclitaxel, depending on tolerance to treatment She will continue reduced dose chemotherapy due to history of severe pancytopenia and peripheral neuropathy  Pancytopenia, acquired ([place]) She have history of severe pancytopenia due to treatment I anticipate she will continue to have major pancytopenia I plan to reduce carboplatin to AUC of 4.5 and continue on reduced dose Taxol due to peripheral neuropathy  Chemotherapy-induced peripheral neuropathy ([place]) She has multifactorial peripheral neuropathy, due to diabetes, chemotherapy-induced peripheral neuropathy and what sounds like sciatica after recent surgery She will continue gabapentin as scheduled and intermittent pain medicine as needed   No orders of the defined types were placed in this encounter.   All questions were answered. The patient knows to call the clinic with any problems, questions or concerns. The total time spent in the appointment was 30 minutes encounter with patients including review of chart and various tests results, discussions about plan of care and coordination of care plan   Heath Lark, MD 11/19/2020 8:27 AM  INTERVAL  HISTORY: Please see below for problem oriented charting. She returns with her husband to resume chemotherapy after recent surgery She is doing better After surgery, she developed postoperative complication with difficulties with bladder emptying That has resolved Her peripheral neuropathy is stable Last night, she had intermittent episode of right sided sciatica type pain starting on her back rating down to the knees She took oxycodone and since then, it has resolved No nausea No recent changes in bowel habits The patient denies any recent signs or symptoms of bleeding such as spontaneous epistaxis, hematuria or hematochezia.  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[place])  02/26/2016 Initial Diagnosis   Left breast biopsy 11:00 position: invasive ductal carcinoma with DCIS, ER 90%, PR 10%, HER-2 negative, Ki-67 30%, grade 2, 2.2 cm palpable lesion T2 N0 stage II a clinical stage   03/18/2016 Surgery   Left lumpectomy: Invasive ductal carcinoma, grade 2, 6.3 cm, with high-grade DCIS, margins negative, 0/4 lymph nodes negative, ER 90%, via 10%, HER-2 negative ratio 0.97, Ki-67 30%, T3 N0 stage IIB   03/25/2016 Procedure   Genetic testing is negative for pathogenic mutations within any of the 20 Genes on the breast/ovarian cancer panel   04/04/2016 Oncotype testing   Oncotype DX recurrence score 37, 25% 10 year distant risk of recurrence   04/17/2016 - 07/31/2016 Chemotherapy   Adjuvant chemotherapy with dose dense Adriamycin and Cytoxan followed by Abraxane weekly 8 ( discontinued due to neuropathy)   09/01/2016 - 09/26/2016 Radiation Therapy   Adj XRT 1) Left breast: 42.5 Gy in 17 fractions. 2) Left breast boost: 7.5 Gy in 3 fractions.   12/02/2016 -  Anti-estrogen oral therapy   Anastrozole 1 mg daily   Ovarian cancer ([place])  04/03/2020  Imaging   US pelvis Complex cystic and solid mass in LEFT adnexa 12.5 cm diameter  question cystic ovarian neoplasm; recommend correlation with serum tumor markers and further evaluation by MR imaging with and without contrast.     04/04/2020 Imaging   US venous Doppler No evidence of deep venous thrombosis in the right lower extremity. Left common femoral vein also patent.   04/15/2020 Imaging   MRI pelvis 1. Large complex solid and cystic mass arising from the right adnexa measuring 10.8 by 11.5 by 11.1 cm. This has an aggressive appearance with extensive enhancing mural soft tissue components. Findings are highly suspicious for malignant ovarian neoplasm. 2. Extensive bilateral retroperitoneal and bilateral iliac adenopathy compatible with metastatic disease. 3. Signs of extensive peritoneal carcinomatosis including ascites, enhancement, thickening and nodularity of the peritoneal reflections, omental caking and bulky peritoneal nodularity. 4. Suspected serosal involvement of the dome of bladder with loss of normal fat plane. Cannot rule out mural invasion by tumor.   04/17/2020 Tumor Marker   Patient's tumor was tested for the following markers: CA-125 Results of the tumor marker test revealed 1762.   04/20/2020 Cancer Staging   Staging form: Ovary, Fallopian Tube, and Primary Peritoneal Carcinoma, AJCC 8th Edition - Clinical: FIGO Stage IIIC (cT3, cN1, cM0) - Signed by Heath Lark, MD on 04/20/2020   04/23/2020 Imaging   1. Redemonstrated dominant mixed solid and cystic mass arising from the vicinity of the right ovary measuring at least 11.5 x 10.7 cm, not significantly changed compared to prior MR, and consistent with primary ovarian malignancy.  2. Numerous bulky retroperitoneal, bilateral iliac, and pelvic sidewall lymph nodes. 3. Moderate volume ascites throughout the abdomen and pelvis with subtle thickening and nodularity throughout the peritoneum, and extensive bulky nodular metastatic disease of the omentum. 4. Constellation of findings is consistent with  advanced nodal and peritoneal metastatic disease. 5. There are prominent subcentimeter epicardial lymph nodes, nonspecific although suspicious for nodal metastatic disease. No definite nodal metastatic disease in the chest. Attention on follow-up. 6. There are multiple small subpleural nodules at the right lung base overlying the diaphragm, measuring up to 7 mm. These are generally nonspecific and less favored to represent pulmonary metastatic disease given distribution. Attention on follow-up. 7. Somewhat coarse contour of the liver, suggestive of cirrhosis, although out without overt morphologic stigmata. 8. Aortic Atherosclerosis (ICD10-I70.0).     04/24/2020 Procedure   Successful placement of a right internal jugular approach power injectable Port-A-Cath. The catheter is ready for immediate use.     05/02/2020 Tumor Marker   Patient's tumor was tested for the following markers: CA-125 Results of the tumor marker test revealed 1921   05/03/2020 -  Chemotherapy   The patient had carboplatin and taxol for chemotherapy treatment.     05/08/2020 Procedure   Successful ultrasound-guided paracentesis yielding 2.6 liters of peritoneal fluid.   05/16/2020 Procedure   Successful ultrasound-guided paracentesis yielding 3.2 liters of peritoneal fluid   05/25/2020 Procedure   Successful ultrasound-guided paracentesis yielding 3 liters of peritoneal fluid.     06/01/2020 Procedure   Successful ultrasound-guided therapeutic paracentesis yielding 1.7 liters of peritoneal fluid.   06/14/2020 Tumor Marker   Patient's tumor was tested for the following markers: CA-125 Results of the tumor marker test revealed 1309   06/20/2020 Imaging   1. Dominant mixed cystic and solid mass in the central pelvis is stable in the interval. 2. Clear interval decrease in retroperitoneal and pelvic sidewall lymphadenopathy. The bulky omental disease has also  clearly decreased in the interval. 3. Interval decrease in  ascites. 4. Stable 8 mm subpleural nodule along the diaphragm. 5. Aortic Atherosclerosis (ICD10-I70.0).   07/10/2020 Tumor Marker   Patient's tumor was tested for the following markers: CA-125 Results of the tumor marker test revealed 220   08/03/2020 Tumor Marker   Patient's tumor was tested for the following markers: CA-125 Results of the tumor marker test revealed 53.3.   09/03/2020 Tumor Marker   Patient's tumor was tested for the following markers: CA-125 Results of the tumor marker test revealed 29.7   09/20/2020 Imaging   1. Substantial improvement. The right ovarian cystic and solid mass is half the volume that it measured on 06/20/2020. Similar reduction in the bulk of the omental caking of tumor. Prior ascites is resolved and the retroperitoneal adenopathy is markedly improved. 2. Other imaging findings of potential clinical significance: Stable pleural-based nodularity along the right hemidiaphragm measuring about 1.1 by 0.7 by 0.3 cm. Stable hypodense lesion of the right kidney upper pole, probably a cyst although the configuration of this lesion makes it difficult to obtain accurate density measurements. Left ovarian cyst, stable. Chondrocalcinosis involving the acetabular labra. Mild lumbar spondylosis and degenerative disc disease. 3. Aortic atherosclerosis.     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 (stage ypT3c), predominantly viable with focal fibrosis and hemosiderin laden macrophages (possible mild treatment effect)  B: Ovary and fallopian tube, right, salpingo-oophorectomy - High grade serous carcinoma involving right ovary and fallopian tube with surface involvement, size up to 8 cm  - Areas of necrosis and hemosiderin laden  macrophages suggestive of treatment effect - 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 - See synoptic report and comment  C: Ovary and fallopian tube, left, salpingo-oophorectomy - High grade serous carcinoma involving the left fallopian tube, size up to 0.5 cm - Ovary with no definite involvement by carcinoma identified - Nodular area of endometriosis and peritoneal inclusion cyst also present - See synoptic report and comment   Procedure:    Bilateral salpingo-oophorectomy    Procedure:    Omentectomy    Specimen Integrity of Right Ovary:    Intact with surface involvement by tumor    Specimen Integrity of Left Ovary:    Capsule intact   TUMOR Tumor Site:    Right fallopian tube: favor as primary site; also involves right ovary and left fallopian tube  Histologic Type:    Serous carcinoma  Histologic Grade:    High grade  Tumor Size:    Greatest Dimension (Centimeters): in fallopian tube: 1.0 cm; in ovary: 8.0 cm Ovarian Surface Involvement:    Present    Laterality:    Right  Fallopian Tube Surface Involvement:    Present    Laterality:    Bilateral  Other Tissue / Organ Involvement:    Right ovary  Other Tissue / Organ Involvement:    Right fallopian tube  Other Tissue / Organ Involvement:    Left fallopian tube  Other Tissue / Organ Involvement:    Omentum  Largest Extrapelvic Peritoneal Focus:    Macroscopic (greater than 2 cm)  Peritoneal / Ascitic Fluid:    Not submitted / unknown  Pleural Fluid:    Not submitted / unknown  Treatment Effect:    No definite or minimal  response identified (chemotherapy response score [CRS] 1)   LYMPH NODES Regional Lymph Nodes:    No lymph nodes submitted or found   PATHOLOGIC STAGE CLASSIFICATION (pTNM, AJCC 8th Edition) TNM Descriptors:    y (post-treatment)  Primary Tumor (pT):    pT3c  Regional Lymph Nodes (pN):    pNX   FIGO STAGE FIGO Stage:    IIIC   ADDITIONAL FINDINGS Additional  Findings:    Serous tubal intraepithelial carcinoma (STIC)  Additional Findings:    Left ovary with no definite carcinoma identified, left-sided nodule of endometriosis and peritoneal inclusion cyst    10/18/2020 - 10/20/2020 Hospital Admission   She was admitted to [facility] for interval debulking surgery     REVIEW OF SYSTEMS:   Constitutional: Denies fevers, chills or abnormal weight loss Eyes: Denies blurriness of vision Ears, nose, mouth, throat, and face: Denies mucositis or sore throat Respiratory: Denies cough, dyspnea or wheezes Cardiovascular: Denies palpitation, chest discomfort or lower extremity swelling Gastrointestinal:  Denies nausea, heartburn or change in bowel habits Skin: Denies abnormal skin rashes Lymphatics: Denies new lymphadenopathy or easy bruising Behavioral/Psych: Mood is stable, no new changes  All other systems were reviewed with the patient and are negative.  I have reviewed the past medical history, past surgical history, social history and family history with the patient and they are unchanged from previous note.  ALLERGIES:  is allergic to keflex [cephalexin], meloxicam, prednisone, premarin [estrogens conjugated], and trovan [alatrofloxacin].  MEDICATIONS:  Current Outpatient Medications  Medication Sig Dispense Refill   acetaminophen (TYLENOL) 325 MG tablet Take 325 mg by mouth every 6 (six) hours as needed for headache.     allopurinol (ZYLOPRIM) 300 MG tablet Take       1 tablet     Daily       to Prevent Gout 90 tablet 0   blood glucose meter kit and supplies KIT Dispense based on patient and insurance preference. Use to check blood sugar once daily. Dx: E11.22, N18.30 1 each 12   Cholecalciferol (VITAMIN D3) 5000 units CAPS Take 5,000 Units by mouth daily.     cyanocobalamin 1000 MCG tablet      dexamethasone (DECADRON) 4 MG tablet Take 2 tabs at the night before and 2 tab the morning of chemotherapy, every 3 weeks, by mouth (Patient  not taking: Reported on 10/01/2020) 36 tablet 5   enoxaparin (LOVENOX) 30 MG/0.3ML injection daily.     ezetimibe (ZETIA) 10 MG tablet Take 10 mg by mouth daily.     gabapentin (NEURONTIN) 300 MG capsule TAKE 1 CAPSULE BY MOUTH 3 TO 4 TIMES PER DAY AS NEEDED FOR PAIN 360 capsule 3   glimepiride (AMARYL) 1 MG tablet Take 1 tab by mouth in the morning if fasting is running 150+. Please do not take this medication if you are ill or not eating as this can cause a low blood sugar. 90 tablet 1   glucose blood (FREESTYLE TEST STRIPS) test strip Check Blood Sugar Daily as directed (Dx: E11.29) 100 strip 3   hydrochlorothiazide (HYDRODIURIL) 25 MG tablet Take 25 mg by mouth daily.     Lancets (FREESTYLE) lancets CHECK BLOOD SUGAR DAILY AS DIRECTED 100 each 3   lidocaine-prilocaine (EMLA) cream Apply to affected area once (Patient taking differently: Apply 1 application topically daily as needed (port access). ) 30 g 3   loratadine (CLARITIN) 10 MG tablet Take 10 mg by mouth daily as needed for allergies.     Magnesium (  [employer] MAGNESIUM) 250 MG TABS Take 250 mg by mouth 2 (two) times daily.     metoprolol succinate (TOPROL-XL) 25 MG 24 hr tablet TAKE 1 TABLET BY MOUTH DAILY FOR BLOOD PRESSURE 90 tablet 3   ondansetron (ZOFRAN) 8 MG tablet Take 1 tablet (8 mg total) by mouth 2 (two) times daily as needed for refractory nausea / vomiting. Start on day 3 after carboplatin chemo. (Patient not taking: Reported on 05/09/2020) 30 tablet 1   oxyCODONE (OXY IR/ROXICODONE) 5 MG immediate release tablet Take 1 tablet by mouth every 6 (six) hours as needed.     polyethylene glycol powder (GLYCOLAX/MIRALAX) 17 GM/SCOOP powder Take by mouth daily as needed.     prochlorperazine (COMPAZINE) 10 MG tablet Take 1 tablet (10 mg total) by mouth every 6 (six) hours as needed for nausea or vomiting. (Patient not taking: Reported on 05/09/2020) 60 tablet 9   senna-docusate (SENOKOT-S) 8.6-50 MG tablet Take 1 tablet by mouth  at bedtime.     vitamin C (ASCORBIC ACID) 500 MG tablet Take 500 mg by mouth every evening.      No current facility-administered medications for this visit.    PHYSICAL EXAMINATION: ECOG PERFORMANCE STATUS: 1 - Symptomatic but completely ambulatory  Vitals:   11/19/20 0807  BP: 130/64  Pulse: 80  Resp: 17  Temp: (!) 97.3 F (36.3 C)  SpO2: 100%   Filed Weights   11/19/20 0807  Weight: 134 lb (60.8 kg)    GENERAL:alert, no distress and comfortable  Musculoskeletal:no cyanosis of digits and no clubbing  NEURO: alert & oriented x 3 with fluent speech, no focal motor/sensory deficits  LABORATORY DATA:  I have reviewed the data as listed    Component Value Date/Time   NA 143 11/01/2020 1032   NA 141 11/06/2016 1102   K 3.4 (L) 11/01/2020 1032   K 3.9 11/06/2016 1102   CL 103 11/01/2020 1032   CO2 25 11/01/2020 1032   CO2 27 11/06/2016 1102   GLUCOSE 100 (H) 11/01/2020 1032   GLUCOSE 130 11/06/2016 1102   BUN 23 11/01/2020 1032   BUN 18.0 11/06/2016 1102   CREATININE 1.17 (H) 11/01/2020 1032   CREATININE 1.3 (H) 11/06/2016 1102   CALCIUM 10.0 11/01/2020 1032   CALCIUM 10.2 11/06/2016 1102   PROT 6.7 11/01/2020 1032   PROT 7.1 11/06/2016 1102   ALBUMIN 3.6 10/01/2020 1202   ALBUMIN 3.7 11/06/2016 1102   AST 31 11/01/2020 1032   AST 42 (H) 10/01/2020 1202   AST 52 (H) 11/06/2016 1102   ALT 26 11/01/2020 1032   ALT 37 10/01/2020 1202   ALT 58 (H) 11/06/2016 1102   ALKPHOS 149 (H) 10/01/2020 1202   ALKPHOS 159 (H) 11/06/2016 1102   BILITOT 0.3 11/01/2020 1032   BILITOT 0.4 10/01/2020 1202   BILITOT 0.53 11/06/2016 1102   GFRNONAA 43 (L) 11/01/2020 1032   GFRAA 50 (L) 11/01/2020 1032    No results found for: SPEP, UPEP  Lab Results  Component Value Date   WBC 5.6 11/19/2020   NEUTROABS 4.8 11/19/2020   HGB 9.9 (L) 11/19/2020   HCT 30.2 (L) 11/19/2020   MCV 95.0 11/19/2020   PLT 155 11/19/2020      Chemistry      Component Value Date/Time   NA  143 11/01/2020 1032   NA 141 11/06/2016 1102   K 3.4 (L) 11/01/2020 1032   K 3.9 11/06/2016 1102   CL 103 11/01/2020 1032   CO2 25  11/01/2020 1032   CO2 27 11/06/2016 1102   BUN 23 11/01/2020 1032   BUN 18.0 11/06/2016 1102   CREATININE 1.17 (H) 11/01/2020 1032   CREATININE 1.3 (H) 11/06/2016 1102      Component Value Date/Time   CALCIUM 10.0 11/01/2020 1032   CALCIUM 10.2 11/06/2016 1102   ALKPHOS 149 (H) 10/01/2020 1202   ALKPHOS 159 (H) 11/06/2016 1102   AST 31 11/01/2020 1032   AST 42 (H) 10/01/2020 1202   AST 52 (H) 11/06/2016 1102   ALT 26 11/01/2020 1032   ALT 37 10/01/2020 1202   ALT 58 (H) 11/06/2016 1102   BILITOT 0.3 11/01/2020 1032   BILITOT 0.4 10/01/2020 1202   BILITOT 0.53 11/06/2016 1102

## 2020-11-19 NOTE — Assessment & Plan Note (Signed)
She has multifactorial peripheral neuropathy, due to diabetes, chemotherapy-induced peripheral neuropathy and what sounds like sciatica after recent surgery She will continue gabapentin as scheduled and intermittent pain medicine as needed

## 2020-11-19 NOTE — Assessment & Plan Note (Signed)
I have reviewed her outside records extensively and summarize her oncologic history She have excellent response to treatment I will get our GYN navigator to make sure that germline mutation testing was done on her pathology We will continue 2-3 more cycles of carboplatin and paclitaxel, depending on tolerance to treatment She will continue reduced dose chemotherapy due to history of severe pancytopenia and peripheral neuropathy

## 2020-11-19 NOTE — Telephone Encounter (Signed)
Scheduled appointments per 12/20 sch msg. Spoke to patient who is aware of appointments dates and times.

## 2020-11-20 LAB — FOLATE RBC
Folate, Hemolysate: 567 ng/mL
Folate, RBC: 1789 ng/mL (ref >498)
Hematocrit: 31.7 % — ABNORMAL LOW (ref 34.0–46.6)

## 2020-11-20 LAB — CA 125: Cancer Antigen (CA) 125: 38.3 U/mL — ABNORMAL HIGH (ref 0.0–38.1)

## 2020-11-22 ENCOUNTER — Other Ambulatory Visit: Payer: Self-pay

## 2020-11-22 ENCOUNTER — Emergency Department (HOSPITAL_COMMUNITY)
Admission: EM | Admit: 2020-11-22 | Discharge: 2020-11-23 | Disposition: A | Discharge status: Home / Self Care | Payer: PPO | Attending: Emergency Medicine | Admitting: Emergency Medicine

## 2020-11-22 ENCOUNTER — Telehealth: Payer: Self-pay

## 2020-11-22 ENCOUNTER — Emergency Department (HOSPITAL_COMMUNITY): Payer: PPO

## 2020-11-22 DIAGNOSIS — Z8543 Personal history of malignant neoplasm of ovary: Secondary | ICD-10-CM | POA: Insufficient documentation

## 2020-11-22 DIAGNOSIS — M1611 Unilateral primary osteoarthritis, right hip: Secondary | ICD-10-CM | POA: Diagnosis not present

## 2020-11-22 DIAGNOSIS — M25551 Pain in right hip: Secondary | ICD-10-CM | POA: Diagnosis not present

## 2020-11-22 DIAGNOSIS — Z853 Personal history of malignant neoplasm of breast: Secondary | ICD-10-CM | POA: Diagnosis not present

## 2020-11-22 DIAGNOSIS — Z7984 Long term (current) use of oral hypoglycemic drugs: Secondary | ICD-10-CM | POA: Insufficient documentation

## 2020-11-22 DIAGNOSIS — Z79899 Other long term (current) drug therapy: Secondary | ICD-10-CM | POA: Diagnosis not present

## 2020-11-22 DIAGNOSIS — R600 Localized edema: Secondary | ICD-10-CM | POA: Diagnosis not present

## 2020-11-22 DIAGNOSIS — I129 Hypertensive chronic kidney disease with stage 1 through stage 4 chronic kidney disease, or unspecified chronic kidney disease: Secondary | ICD-10-CM | POA: Diagnosis not present

## 2020-11-22 DIAGNOSIS — E1122 Type 2 diabetes mellitus with diabetic chronic kidney disease: Secondary | ICD-10-CM | POA: Insufficient documentation

## 2020-11-22 DIAGNOSIS — N183 Chronic kidney disease, stage 3 unspecified: Secondary | ICD-10-CM | POA: Diagnosis not present

## 2020-11-22 DIAGNOSIS — M79604 Pain in right leg: Secondary | ICD-10-CM | POA: Insufficient documentation

## 2020-11-22 LAB — CBC WITH DIFFERENTIAL/PLATELET
Abs Immature Granulocytes: 0.01 10*3/uL (ref 0.00–0.07)
Basophils Absolute: 0 10*3/uL (ref 0.0–0.1)
Basophils Relative: 0 %
Eosinophils Absolute: 0.1 10*3/uL (ref 0.0–0.5)
Eosinophils Relative: 2 %
HCT: 30.8 % — ABNORMAL LOW (ref 36.0–46.0)
Hemoglobin: 9.8 g/dL — ABNORMAL LOW (ref 12.0–15.0)
Immature Granulocytes: 0 %
Lymphocytes Relative: 27 %
Lymphs Abs: 1.2 10*3/uL (ref 0.7–4.0)
MCH: 30.8 pg (ref 26.0–34.0)
MCHC: 31.8 g/dL (ref 30.0–36.0)
MCV: 96.9 fL (ref 80.0–100.0)
Monocytes Absolute: 0.1 10*3/uL (ref 0.1–1.0)
Monocytes Relative: 2 %
Neutro Abs: 3.1 10*3/uL (ref 1.7–7.7)
Neutrophils Relative %: 69 %
Platelets: 146 10*3/uL — ABNORMAL LOW (ref 150–400)
RBC: 3.18 MIL/uL — ABNORMAL LOW (ref 3.87–5.11)
RDW: 14.8 % (ref 11.5–15.5)
WBC: 4.5 10*3/uL (ref 4.0–10.5)
nRBC: 0 % (ref 0.0–0.2)

## 2020-11-22 LAB — BASIC METABOLIC PANEL
Anion gap: 13 (ref 5–15)
BUN: 46 mg/dL — ABNORMAL HIGH (ref 8–23)
CO2: 26 mmol/L (ref 22–32)
Calcium: 9.7 mg/dL (ref 8.9–10.3)
Chloride: 100 mmol/L (ref 98–111)
Creatinine, Ser: 1.39 mg/dL — ABNORMAL HIGH (ref 0.44–1.00)
GFR, Estimated: 38 mL/min — ABNORMAL LOW (ref >60)
Glucose, Bld: 104 mg/dL — ABNORMAL HIGH (ref 70–99)
Potassium: 3.8 mmol/L (ref 3.5–5.1)
Sodium: 139 mmol/L (ref 135–145)

## 2020-11-22 MED ORDER — ACETAMINOPHEN 325 MG PO TABS
650.0000 mg | ORAL_TABLET | Freq: Once | ORAL | Status: AC
  Administered 2020-11-22: 22:00:00 650 mg via ORAL
  Filled 2020-11-22: qty 2

## 2020-11-22 MED ORDER — OXYCODONE-ACETAMINOPHEN 5-325 MG PO TABS
1.0000 | ORAL_TABLET | Freq: Once | ORAL | Status: AC
  Administered 2020-11-22: 22:00:00 1 via ORAL
  Filled 2020-11-22: qty 1

## 2020-11-22 MED ORDER — ENOXAPARIN SODIUM 60 MG/0.6ML ~~LOC~~ SOLN
1.0000 mg/kg | Freq: Once | SUBCUTANEOUS | Status: AC
  Administered 2020-11-23: 62.5 mg via SUBCUTANEOUS
  Filled 2020-11-22: qty 0.63

## 2020-11-22 NOTE — Discharge Instructions (Addendum)
Follow the instructions on this packet for ultrasound to evaluate for blood clot in the leg.

## 2020-11-22 NOTE — Telephone Encounter (Signed)
Called and given below message. She verbalized understanding. She will go to urgent care to be evaluated. She will go today.

## 2020-11-22 NOTE — Telephone Encounter (Signed)
She called and left a message. She is still having right hip pain going down her leg. Today she started having edema from knee down to foot. She is concerned for a blood clot. Last Lovenox injection was 12/17.

## 2020-11-22 NOTE — Telephone Encounter (Signed)
I am sorry I cannot evaluate her today She would need to go to ER or urgent care to rule out blood clot

## 2020-11-22 NOTE — ED Triage Notes (Signed)
Pt presents to ED POV. Pt c/o R hip pain that radiates down leg and edema to the leg. Pt reports that she had surgery on 11/18 and was sent today to ensure pt does not have blood clot. L pedal pulse +1

## 2020-11-22 NOTE — ED Provider Notes (Signed)
[facility] EMERGENCY DEPARTMENT Provider Note   CSN: [id number] Arrival date & time: 11/22/20  1623     History Chief Complaint  Patient presents with  . Leg Pain    Kayla Baker is a 84 y.o. female.  HPI      Kayla Baker is a 84 y.o. female, with a history of ovarian cancer receiving chemotherapy, GERD, HTN, hyperlipidemia, presenting to the ED with right lower extremity pain beginning December 17 upon waking.  Pain is constant, seems to be located in the right buttocks and right lateral hip, radiating to the right quadricep and hamstring.  Pain is present regardless of position or movement.  Ambulation or weightbearing does not worsen the pain.  Over the last several days, she is also noted edema to the right lower extremity.  Patient had surgery to remove ovarian tumor November 18. She states she made contact with her oncologist who was suspected her symptoms were due to sciatica, possibly connected with her surgery. She made contact with her PCP who advised her to come to the ED to rule out DVT.  Denies fever/chills, acute numbness, weakness, abdominal pain, acute back pain, other extremity pain/swelling, changes in bowel or bladder function, saddle anesthesias, falls/trauma, shortness of breath, chest pain, or any other complaints.     Past Medical History:  Diagnosis Date  . Allergy   . Anemia   . Arthritis   . Blood transfusion without reported diagnosis   . Breast cancer of upper-inner quadrant of left female breast ([place]) 02/29/2016   skin- 2016- squamous- on right upper arm  . GERD (gastroesophageal reflux disease)   . Gout    takes Allopurinol daily  . History of chemotherapy   . History of colon polyps    benign  . History of shingles   . Hx of radiation therapy   . Hyperlipidemia   . Hypertension   . Ovarian ca ([place]) dx'd 03/2020  . Personal history of chemotherapy    2017  . Personal history of radiation therapy    2017  . Vitamin  D deficiency     Patient Active Problem List   Diagnosis Date Noted  . Deficiency anemia 11/02/2020  . Genetic testing 09/14/2020  . Protein-calorie malnutrition, moderate ([place]) 06/05/2020  . Pancytopenia, acquired ([place]) 05/15/2020  . Cancer associated pain 05/15/2020  . Anemia in neoplastic disease 05/03/2020  . Goals of care, counseling/discussion 04/20/2020  . Ovarian cancer ([place]) 04/19/2020  . Leg edema, right 04/05/2020  . Incontinence of urine in female 04/04/2020  . CKD stage 3 due to type 2 diabetes mellitus ([place]) 07/05/2018  . Port catheter in place 09/18/2016  . Chemotherapy-induced peripheral neuropathy ([place]) 08/07/2016  . Breast cancer of upper-inner quadrant of left female breast ([place]) 02/29/2016  . Gout 12/08/2014  . Diabetic neuropathy, painful ([place]) 08/28/2014  . Medication management 08/27/2014  . Essential hypertension 11/10/2013  . Hyperlipidemia associated with type 2 diabetes mellitus ([place]) 11/10/2013  . T2_NIDDM w/CKD3 ([place]) 11/10/2013  . Vitamin D deficiency 11/10/2013    Past Surgical History:  Procedure Laterality Date  . ABDOMINAL HYSTERECTOMY  1973  . BREAST BIOPSY Left 02/26/2016  . BREAST LUMPECTOMY Left 03/18/2016  . CARDIAC CATHETERIZATION     patient denies this procedure  . CATARACT EXTRACTION, BILATERAL  2014   right eye 2/14; left eye 3/3  . COLONOSCOPY    . HAND SURGERY Left 2012  . INCONTINENCE SURGERY  2006  . IR IMAGING GUIDED  PORT INSERTION  04/24/2020  . IR PARACENTESIS  05/08/2020  . IR PARACENTESIS  05/16/2020  . IR PARACENTESIS  05/25/2020  . KNEE ARTHROSCOPY Right   . MASTOPEXY Right 06/16/2017   Procedure: RIGHT BREAST MASTOPEXY;  Surgeon: Irene Limbo, MD;  Location: [facility];  Service: Plastics;  Laterality: Right;  . PORT-A-CATH REMOVAL N/A 11/27/2016   Procedure: REMOVAL PORT-A-CATH;  Surgeon: Fanny Skates, MD;  Location: [facility];  Service: General;  Laterality: N/A;  . PORTACATH PLACEMENT N/A  04/14/2016   Procedure: INSERTION PORT-A-CATH ;  Surgeon: Fanny Skates, MD;  Location: [facility];  Service: General;  Laterality: N/A;  . RADIOACTIVE SEED GUIDED PARTIAL MASTECTOMY WITH AXILLARY SENTINEL LYMPH NODE BIOPSY Left 03/18/2016   Procedure: RADIOACTIVE SEED GUIDED LEFT PARTIAL MASTECTOMY WITH AXILLARY SENTINEL LYMPH NODE BIOPSY AND BLUE DYE INJECTION;  Surgeon: Fanny Skates, MD;  Location: [facility];  Service: General;  Laterality: Left;  . REDUCTION MAMMAPLASTY Right 2018     OB History    Gravida  0   Para  0   Term  0   Preterm  0   AB  0   Living  0     SAB  0   IAB  0   Ectopic  0   Multiple  0   Live Births  0           Family History  Problem Relation Age of Onset  . Stroke Mother 35  . Other Mother        history of hysterectomy after last childbirth  . Diabetes Father   . Alzheimer's disease Father   . Hypertension Sister   . Lung cancer Sister 70       smoker  . Other Sister        history of hysterectomy for fibroids  . Breast cancer Sister 49  . Esophageal cancer Maternal Aunt 74       not a smoker  . Heart attack Maternal Uncle 65  . Cirrhosis Sister   . Lung cancer Other        nephew dx. 22s; +smoker  . Epilepsy Other   . Other Other 12       great niece dx. benign ganglioglioma brain tumor; treated at [facility]  . Epilepsy Other        no seizures in 2 years  . Parkinson's disease Maternal Aunt   . Stroke Maternal Grandmother   . Heart Problems Maternal Grandmother   . Pancreatic cancer Cousin 26       paternal 1st cousin  . Colon cancer Neg Hx   . Stomach cancer Neg Hx   . Colon polyps Neg Hx   . Ulcerative colitis Neg Hx     Social History   Tobacco Use  . Smoking status: Never Smoker  . Smokeless tobacco: Never Used  Vaping Use  . Vaping Use: Never used  Substance Use Topics  . Alcohol use: No  . Drug use: No    Home Medications Prior to Admission medications   Medication Sig Start Date End Date Taking? Authorizing  Provider  acetaminophen (TYLENOL) 325 MG tablet Take 325 mg by mouth every 6 (six) hours as needed for headache.   Yes [provider]  allopurinol (ZYLOPRIM) 300 MG tablet Take       1 tablet     Daily       to Prevent Gout 10/23/20  Yes [name], MD  blood glucose meter kit  and supplies KIT Dispense based on patient and insurance preference. Use to check blood sugar once daily. Dx: E11.22, N18.30 05/09/20  Yes Liane Comber, NP  Cholecalciferol (VITAMIN D3) 5000 units CAPS Take 5,000 Units by mouth daily.   Yes [provider]  cyanocobalamin 1000 MCG tablet Take 1,000 mcg by mouth daily.   Yes [provider]  dexamethasone (DECADRON) 4 MG tablet Take 2 tabs at the night before and 2 tab the morning of chemotherapy, every 3 weeks, by mouth 04/19/20  Yes Gorsuch, Ni, MD  ezetimibe (ZETIA) 10 MG tablet Take 10 mg by mouth daily. 07/31/20  Yes [provider]  gabapentin (NEURONTIN) 300 MG capsule TAKE 1 CAPSULE BY MOUTH 3 TO 4 TIMES PER DAY AS NEEDED FOR PAIN Patient taking differently: Take 300 mg by mouth 4 (four) times daily as needed (pain). 09/21/20  Yes [name], MD  glimepiride (AMARYL) 1 MG tablet Take 1 tab by mouth in the morning if fasting is running 150+. Please do not take this medication if you are ill or not eating as this can cause a low blood sugar. 05/04/20  Yes Liane Comber, NP  glucose blood (FREESTYLE TEST STRIPS) test strip Check Blood Sugar Daily as directed (Dx: E11.29) 10/28/19  Yes [name], MD  hydrochlorothiazide (HYDRODIURIL) 25 MG tablet Take 25 mg by mouth daily. 09/28/20  Yes [provider]  Lancets (FREESTYLE) lancets CHECK BLOOD SUGAR DAILY AS DIRECTED 09/03/20  Yes [name], MD  lidocaine-prilocaine (EMLA) cream Apply to affected area once Patient taking differently: Apply 1 application topically daily as needed (port access). 04/19/20  Yes Gorsuch, Ni, MD  loratadine (CLARITIN) 10 MG tablet  Take 10 mg by mouth daily as needed for allergies.   Yes [provider]  Magnesium 250 MG TABS Take 250 mg by mouth every other day.   Yes [provider]  metoprolol succinate (TOPROL-XL) 25 MG 24 hr tablet TAKE 1 TABLET BY MOUTH DAILY FOR BLOOD PRESSURE Patient taking differently: Take 25 mg by mouth daily. 09/18/20  Yes Liane Comber, NP  ondansetron (ZOFRAN) 8 MG tablet Take 1 tablet (8 mg total) by mouth 2 (two) times daily as needed for refractory nausea / vomiting. Start on day 3 after carboplatin chemo. 04/19/20  Yes Gorsuch, Ni, MD  oxyCODONE (OXY IR/ROXICODONE) 5 MG immediate release tablet Take 1 tablet by mouth every 6 (six) hours as needed for moderate pain or severe pain. 10/20/20  Yes [provider]  prochlorperazine (COMPAZINE) 10 MG tablet Take 1 tablet (10 mg total) by mouth every 6 (six) hours as needed for nausea or vomiting. 05/07/20  Yes Gorsuch, Ni, MD  vitamin C (ASCORBIC ACID) 500 MG tablet Take 500 mg by mouth every evening.    Yes [provider]  enoxaparin (LOVENOX) 30 MG/0.3ML injection daily. Patient not taking: No sig reported 10/24/20   [provider]    Allergies    Keflex [cephalexin], Meloxicam, Prednisone, Premarin [estrogens conjugated], and Trovan [alatrofloxacin]  Review of Systems   Review of Systems  Constitutional: Negative for chills and fever.  Respiratory: Negative for cough and shortness of breath.   Cardiovascular: Positive for leg swelling. Negative for chest pain.  Gastrointestinal: Negative for abdominal pain.  Genitourinary: Negative for difficulty urinating.  Musculoskeletal: Positive for arthralgias and myalgias. Negative for back pain.  All other systems reviewed and are negative.   Physical Exam Updated Vital Signs BP (!) 162/73   Pulse 94   Temp 98.3  F (36.8 C) (Oral)   Resp 18   Ht _0  (1.626 m)   Wt 62.1 kg   SpO2 97%   BMI 23.52 kg/m   Physical Exam Vitals and nursing  note reviewed.  Constitutional:      General: She is not in acute distress.    Appearance: She is well-developed. She is not diaphoretic.  HENT:     Head: Normocephalic and atraumatic.     Mouth/Throat:     Mouth: Mucous membranes are moist.     Pharynx: Oropharynx is clear.  Eyes:     Conjunctiva/sclera: Conjunctivae normal.  Cardiovascular:     Rate and Rhythm: Normal rate and regular rhythm.     Pulses:          Radial pulses are 2+ on the right side and 2+ on the left side.       Dorsalis pedis pulses are 1+ on the right side and 1+ on the left side.       Posterior tibial pulses are detected w/ Doppler on the right side and detected w/ Doppler on the left side.     Heart sounds: Normal heart sounds.     Comments: Tactile temperature in the extremities appropriate and equal bilaterally. Pulmonary:     Effort: Pulmonary effort is normal. No respiratory distress.     Breath sounds: Normal breath sounds.  Abdominal:     Palpations: Abdomen is soft.     Tenderness: There is no abdominal tenderness. There is no guarding.  Musculoskeletal:     Cervical back: Neck supple.     Right lower leg: Edema present.     Left lower leg: No edema.     Comments: Tenderness to the right buttocks and right lateral hip without evidence of swelling, deformity, or instability. Some tenderness to the proximal right quadricep, but no anterior hip or groin tenderness. Pain in the quadricep with knee extension, but no pain in the hamstring with resisted knee flexion.  No tenderness, erythema, increased warmth in the calves.  Lymphadenopathy:     Cervical: No cervical adenopathy.  Skin:    General: Skin is warm and dry.  Neurological:     Mental Status: She is alert.     Comments: Decreased sensation to light touch in the right toes and feet, which patient states is consistent with her baseline neuropathy. 5/5 strength in the bilateral hips, knees, and ankles. Patient is independently ambulatory  with noted ability for weightbearing on the right.  Psychiatric:        Mood and Affect: Mood and affect normal.        Speech: Speech normal.        Behavior: Behavior normal.     ED Results / Procedures / Treatments   Labs (all labs ordered are listed, but only abnormal results are displayed) Labs Reviewed  BASIC METABOLIC PANEL - Abnormal; Notable for the following components:      Result Value   Glucose, Bld 104 (*)    BUN 46 (*)    Creatinine, Ser 1.39 (*)    GFR, Estimated 38 (*)    All other components within normal limits  CBC WITH DIFFERENTIAL/PLATELET - Abnormal; Notable for the following components:   RBC 3.18 (*)    Hemoglobin 9.8 (*)    HCT 30.8 (*)    Platelets 146 (*)    All other components within normal limits    EKG None  Radiology DG Hip Unilat W or  Wo Pelvis 2-3 Views Right  Result Date: 11/22/2020 CLINICAL DATA:  Pain.  Edema.  Surgery 11 18. EXAM: DG HIP (WITH OR WITHOUT PELVIS) 2-3V RIGHT COMPARISON:  None. FINDINGS: Surgical clips in the left hemipelvis. Femoral heads are located. No acute fracture. Mild right hip osteoarthritis, as evidenced by osteophyte formation and mild weight-bearing surface joint space narrowing. IMPRESSION: No acute osseous abnormality. Electronically Signed   By: Abigail Miyamoto M.D.   On: 11/22/2020 16:57    Procedures Procedures (including critical care time)  Medications Ordered in ED Medications  acetaminophen (TYLENOL) tablet 650 mg (650 mg Oral Not Given 11/22/20 2146)  oxyCODONE-acetaminophen (PERCOCET/ROXICET) 5-325 MG per tablet 1 tablet (1 tablet Oral Given 11/22/20 2159)  enoxaparin (LOVENOX) injection 62.5 mg (62.5 mg Subcutaneous Given 11/23/20 0018)    ED Course  I have reviewed the triage vital signs and the nursing notes.  Pertinent labs & imaging results that were available during my care of the patient were reviewed by me and considered in my medical decision making (see chart for details).    MDM  Rules/Calculators/A&P                          Patient presents with right leg pain since December 17.  Patient is nontoxic appearing, afebrile, not tachycardic, not tachypneic, not hypotensive, maintains excellent SPO2 on room air.  I have reviewed the patient's chart to obtain more information.   I reviewed and interpreted the patient's labs and radiological studies. She has no noted acute focal neurologic deficits.  Peripheral pulses intact. We thought it less likely that the patient would have a proximal arterial occlusion but with only pain at the right proximal leg rather than the entire lower extremity. DVT is a consideration, however, vascular ultrasound unavailable at the time patient was seen.  An order was placed for the patient to return in the morning to complete this ultrasound. We discussed additional imaging studies here in the ED, however, patient will explore these options through her PCP or oncology. Multiple analgesic options were discussed, however, patient opted to remain with her already prescribed home oxycodone.  Findings and plan of care discussed with attending physician, Myrtie Cruise, MD. Dr. Langston Masker personally evaluated and examined this patient.   Vitals:   11/22/20 2030 11/22/20 2321 11/22/20 2330 11/23/20 0005  BP: (!) 144/74 (!) 144/62 (!) 143/73 (!) 148/71  Pulse: 89 79 84 76  Resp: _0 Temp:    98.2 F (36.8 C)  TempSrc:    Oral  SpO2: 100% 99% 99% 100%  Weight:      Height:       Lovenox dosage calculation and consideration for renal adjustment according to [employer]:  Renal impairment (CrCl less than 30 mL/min) in acute DVT, when used in conjunction with warfarin, for either inpatient (with or without pulmonary embolism (PE)) or outpatient (without PE) treatment: 1 mg/kg subQ once daily [2] Creatinine clearance calculated to be between 27.5 and 31 depending on the formula used.  Final Clinical Impression(s) / ED Diagnoses Final diagnoses:   Right leg pain    Rx / DC Orders ED Discharge Orders         Ordered    LE VENOUS        11/22/20 2337           Lorayne Bender, PA-C 11/23/20 0133    Wyvonnia Dusky, MD 11/23/20 1009

## 2020-11-23 ENCOUNTER — Emergency Department (HOSPITAL_BASED_OUTPATIENT_CLINIC_OR_DEPARTMENT_OTHER)
Admission: RE | Admit: 2020-11-23 | Discharge: 2020-11-23 | Disposition: A | Discharge status: Home / Self Care | Payer: PPO | Source: Ambulatory Visit | Attending: Emergency Medicine | Admitting: Emergency Medicine

## 2020-11-23 DIAGNOSIS — M79609 Pain in unspecified limb: Secondary | ICD-10-CM

## 2020-11-23 DIAGNOSIS — M7989 Other specified soft tissue disorders: Secondary | ICD-10-CM | POA: Diagnosis not present

## 2020-11-23 NOTE — CV Procedure (Signed)
RLE venous duplex completed.  Results can be found under chart review under CV PROC. 11/23/2020 12:08 PM Draylen Lobue RVT, RDMS

## 2020-11-24 ENCOUNTER — Emergency Department (HOSPITAL_COMMUNITY): Payer: PPO

## 2020-11-24 ENCOUNTER — Other Ambulatory Visit: Payer: Self-pay

## 2020-11-24 ENCOUNTER — Other Ambulatory Visit: Payer: Self-pay | Admitting: Internal Medicine

## 2020-11-24 ENCOUNTER — Emergency Department (HOSPITAL_COMMUNITY)
Admission: EM | Admit: 2020-11-24 | Discharge: 2020-11-24 | Disposition: A | Discharge status: Home / Self Care | Payer: PPO | Attending: Emergency Medicine | Admitting: Emergency Medicine

## 2020-11-24 DIAGNOSIS — R911 Solitary pulmonary nodule: Secondary | ICD-10-CM | POA: Diagnosis not present

## 2020-11-24 DIAGNOSIS — Z79899 Other long term (current) drug therapy: Secondary | ICD-10-CM | POA: Diagnosis not present

## 2020-11-24 DIAGNOSIS — R000 Tachycardia, unspecified: Secondary | ICD-10-CM | POA: Diagnosis not present

## 2020-11-24 DIAGNOSIS — N289 Disorder of kidney and ureter, unspecified: Secondary | ICD-10-CM | POA: Diagnosis not present

## 2020-11-24 DIAGNOSIS — Z7984 Long term (current) use of oral hypoglycemic drugs: Secondary | ICD-10-CM | POA: Diagnosis not present

## 2020-11-24 DIAGNOSIS — R1010 Upper abdominal pain, unspecified: Secondary | ICD-10-CM | POA: Insufficient documentation

## 2020-11-24 DIAGNOSIS — I10 Essential (primary) hypertension: Secondary | ICD-10-CM | POA: Diagnosis not present

## 2020-11-24 DIAGNOSIS — Z8543 Personal history of malignant neoplasm of ovary: Secondary | ICD-10-CM | POA: Diagnosis not present

## 2020-11-24 DIAGNOSIS — D696 Thrombocytopenia, unspecified: Secondary | ICD-10-CM | POA: Diagnosis not present

## 2020-11-24 DIAGNOSIS — R188 Other ascites: Secondary | ICD-10-CM | POA: Diagnosis not present

## 2020-11-24 DIAGNOSIS — E1122 Type 2 diabetes mellitus with diabetic chronic kidney disease: Secondary | ICD-10-CM | POA: Diagnosis not present

## 2020-11-24 DIAGNOSIS — R0789 Other chest pain: Secondary | ICD-10-CM | POA: Diagnosis not present

## 2020-11-24 DIAGNOSIS — D649 Anemia, unspecified: Secondary | ICD-10-CM | POA: Insufficient documentation

## 2020-11-24 DIAGNOSIS — N183 Chronic kidney disease, stage 3 unspecified: Secondary | ICD-10-CM | POA: Insufficient documentation

## 2020-11-24 DIAGNOSIS — Z853 Personal history of malignant neoplasm of breast: Secondary | ICD-10-CM | POA: Diagnosis not present

## 2020-11-24 DIAGNOSIS — R079 Chest pain, unspecified: Secondary | ICD-10-CM | POA: Diagnosis not present

## 2020-11-24 DIAGNOSIS — I129 Hypertensive chronic kidney disease with stage 1 through stage 4 chronic kidney disease, or unspecified chronic kidney disease: Secondary | ICD-10-CM | POA: Diagnosis not present

## 2020-11-24 LAB — COMPREHENSIVE METABOLIC PANEL
ALT: 30 U/L (ref 0–44)
AST: 30 U/L (ref 15–41)
Albumin: 3.6 g/dL (ref 3.5–5.0)
Alkaline Phosphatase: 83 U/L (ref 38–126)
Anion gap: 15 (ref 5–15)
BUN: 34 mg/dL — ABNORMAL HIGH (ref 8–23)
CO2: 22 mmol/L (ref 22–32)
Calcium: 9.9 mg/dL (ref 8.9–10.3)
Chloride: 101 mmol/L (ref 98–111)
Creatinine, Ser: 1.47 mg/dL — ABNORMAL HIGH (ref 0.44–1.00)
GFR, Estimated: 35 mL/min — ABNORMAL LOW (ref >60)
Glucose, Bld: 140 mg/dL — ABNORMAL HIGH (ref 70–99)
Potassium: 3.7 mmol/L (ref 3.5–5.1)
Sodium: 138 mmol/L (ref 135–145)
Total Bilirubin: 1.1 mg/dL (ref 0.3–1.2)
Total Protein: 6.5 g/dL (ref 6.5–8.1)

## 2020-11-24 LAB — CBC WITH DIFFERENTIAL/PLATELET
Abs Immature Granulocytes: 0.01 10*3/uL (ref 0.00–0.07)
Basophils Absolute: 0 10*3/uL (ref 0.0–0.1)
Basophils Relative: 0 %
Eosinophils Absolute: 0.1 10*3/uL (ref 0.0–0.5)
Eosinophils Relative: 3 %
HCT: 30.7 % — ABNORMAL LOW (ref 36.0–46.0)
Hemoglobin: 9.7 g/dL — ABNORMAL LOW (ref 12.0–15.0)
Immature Granulocytes: 0 %
Lymphocytes Relative: 22 %
Lymphs Abs: 0.8 10*3/uL (ref 0.7–4.0)
MCH: 30.1 pg (ref 26.0–34.0)
MCHC: 31.6 g/dL (ref 30.0–36.0)
MCV: 95.3 fL (ref 80.0–100.0)
Monocytes Absolute: 0.1 10*3/uL (ref 0.1–1.0)
Monocytes Relative: 2 %
Neutro Abs: 2.7 10*3/uL (ref 1.7–7.7)
Neutrophils Relative %: 73 %
Platelets: 134 10*3/uL — ABNORMAL LOW (ref 150–400)
RBC: 3.22 MIL/uL — ABNORMAL LOW (ref 3.87–5.11)
RDW: 14.8 % (ref 11.5–15.5)
WBC: 3.6 10*3/uL — ABNORMAL LOW (ref 4.0–10.5)
nRBC: 0 % (ref 0.0–0.2)

## 2020-11-24 LAB — TROPONIN I (HIGH SENSITIVITY)
Troponin I (High Sensitivity): 9 ng/L (ref <18)
Troponin I (High Sensitivity): 13 ng/L (ref <18)

## 2020-11-24 LAB — LIPASE, BLOOD: Lipase: 25 U/L (ref 11–51)

## 2020-11-24 MED ORDER — HEPARIN SOD (PORK) LOCK FLUSH 100 UNIT/ML IV SOLN
500.0000 [IU] | INTRAVENOUS | Status: AC | PRN
  Administered 2020-11-24: 12:00:00 500 [IU]
  Filled 2020-11-24: qty 5

## 2020-11-24 MED ORDER — MORPHINE SULFATE (PF) 4 MG/ML IV SOLN
4.0000 mg | Freq: Once | INTRAVENOUS | Status: AC
  Administered 2020-11-24: 06:00:00 4 mg via INTRAVENOUS
  Filled 2020-11-24: qty 1

## 2020-11-24 MED ORDER — MORPHINE SULFATE (PF) 4 MG/ML IV SOLN
4.0000 mg | Freq: Once | INTRAVENOUS | Status: AC
Start: 2020-11-24 — End: 2020-11-24
  Administered 2020-11-24: 08:00:00 4 mg via INTRAVENOUS
  Filled 2020-11-24: qty 1

## 2020-11-24 MED ORDER — SODIUM CHLORIDE 0.9% FLUSH: 10.0000 mL | INTRAVENOUS | Status: DC | PRN

## 2020-11-24 MED ORDER — IOHEXOL 350 MG/ML SOLN
75.0000 mL | Freq: Once | INTRAVENOUS | Status: AC | PRN
  Administered 2020-11-24: 06:00:00 75 mL via INTRAVENOUS

## 2020-11-24 MED ORDER — CHLORHEXIDINE GLUCONATE CLOTH 2 % EX PADS: 6.0000 | MEDICATED_PAD | Freq: Every day | CUTANEOUS | Status: DC

## 2020-11-24 MED ORDER — ONDANSETRON HCL 4 MG/2ML IJ SOLN
4.0000 mg | Freq: Once | INTRAMUSCULAR | Status: AC
  Administered 2020-11-24: 06:00:00 4 mg via INTRAVENOUS
  Filled 2020-11-24: qty 2

## 2020-11-24 MED ORDER — SODIUM CHLORIDE 0.9% FLUSH: 10.0000 mL | Freq: Two times a day (BID) | INTRAVENOUS | Status: DC

## 2020-11-24 NOTE — ED Triage Notes (Signed)
Brought in by [facility] from home, Tightness and pressure on center of chest (5/10), non-radiating.  hx of ovarian cancer. Recently received chemo on Monday.

## 2020-11-24 NOTE — ED Provider Notes (Signed)
Patient CT chest and abdomen without any acute findings.  Troponins stable.  Second troponin was 13 and first troponin was 9.  Only a change of 4.  Patient really without classical chest pain type symptoms.  Feel patient safe for discharge home.  We will have her double up on her oxycodone as needed.  Feel that her pain is probably musculoskeletal in nature.  She has follow-up on Monday with her doctors.  Patient stable for discharge home.   Fredia Sorrow, MD 11/24/20 1046

## 2020-11-24 NOTE — Discharge Instructions (Addendum)
Continue your oxycodone for pain.  You could take 2 of the 5 mg tablets if extra pain relief needed.  Follow-up with your doctors as scheduled.  Today's work-up to include CT chest and abdomen without any acute findings.  And cardiac markers have remained stable.  Return for any new or worse symptoms.

## 2020-11-24 NOTE — ED Notes (Signed)
Back to room at this time.

## 2020-11-24 NOTE — ED Notes (Signed)
Patient transported to CT 

## 2020-11-24 NOTE — ED Triage Notes (Signed)
Pt received 1 tab sublingual nitro. Pt took 338m aspirin prior to EMS arrival. 100cc of saline.

## 2020-11-24 NOTE — ED Provider Notes (Signed)
[facility] EMERGENCY DEPARTMENT Provider Note   CSN: [id number] Arrival date & time: 11/24/20  8127   History Chief Complaint  Patient presents with  . Chest Pain    Kayla Baker is a 85 y.o. female.  The history is provided by the patient.  Chest Pain She has history of hypertension, diabetes, hyperlipidemia, ovarian cancer, chronic kidney disease and comes in because of chest discomfort since yesterday afternoon.  She describes a tight feeling across her lower rib cage without radiation.  She rates his discomfort at 5/10.  There is no associated dyspnea, nausea but there has been some diaphoresis.  Pain does not seem to be affected by body position or breathing.  She took a dose of oxycodone without any relief.  She came in by ambulance and EMS gave her aspirin and nitroglycerin.  This did not give her any relief of the discomfort, but made her feel like she was going to pass out.  She is a non-smoker and there is no family history of premature coronary atherosclerosis.  She did have surgery related to her ovarian cancer about 6 weeks ago.  Of note, she had been seen in the ED 2 days ago and was sent for venous Doppler to rule out DVT.  Venous Doppler was negative for DVT.  Past Medical History:  Diagnosis Date  . Allergy   . Anemia   . Arthritis   . Blood transfusion without reported diagnosis   . 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 GERD (gastroesophageal reflux disease)   . Gout    takes Allopurinol daily  . History of chemotherapy   . History of colon polyps    benign  . History of shingles   . Hx of radiation therapy   . Hyperlipidemia   . Hypertension   . Ovarian ca ([place]) dx'd 03/2020  . Personal history of chemotherapy    2017  . Personal history of radiation therapy    2017  . Vitamin D deficiency     Patient Active Problem List   Diagnosis Date Noted  .  Deficiency anemia 11/02/2020  . Genetic testing 09/14/2020  . Protein-calorie malnutrition, moderate ([place]) 06/05/2020  . Pancytopenia, acquired ([place]) 05/15/2020  . Cancer associated pain 05/15/2020  . Anemia in neoplastic disease 05/03/2020  . Goals of care, counseling/discussion 04/20/2020  . Ovarian cancer ([place]) 04/19/2020  . Leg edema, right 04/05/2020  . Incontinence of urine in female 04/04/2020  . CKD stage 3 due to type 2 diabetes mellitus ([place]) 07/05/2018  . Port catheter in place 09/18/2016  . Chemotherapy-induced peripheral neuropathy ([place]) 08/07/2016  . Breast cancer of upper-inner quadrant of left female breast ([place]) 02/29/2016  . Gout 12/08/2014  . Diabetic neuropathy, painful ([place]) 08/28/2014  . Medication management 08/27/2014  . Essential hypertension 11/10/2013  . Hyperlipidemia associated with type 2 diabetes mellitus ([place]) 11/10/2013  . T2_NIDDM w/CKD3 ([place]) 11/10/2013  . Vitamin D deficiency 11/10/2013    Past Surgical History:  Procedure Laterality Date  . ABDOMINAL HYSTERECTOMY  1973  . BREAST BIOPSY Left 02/26/2016  . BREAST LUMPECTOMY Left 03/18/2016  . CARDIAC CATHETERIZATION     patient denies this procedure  . CATARACT EXTRACTION, BILATERAL  2014   right eye 2/14; left eye 3/3  . COLONOSCOPY    . HAND SURGERY Left 2012  . INCONTINENCE SURGERY  2006  . IR IMAGING GUIDED PORT  INSERTION  04/24/2020  . IR PARACENTESIS  05/08/2020  . IR PARACENTESIS  05/16/2020  . IR PARACENTESIS  05/25/2020  . KNEE ARTHROSCOPY Right   . MASTOPEXY Right 06/16/2017   Procedure: RIGHT BREAST MASTOPEXY;  Surgeon: Irene Limbo, MD;  Location: [place] [facility];  Service: Plastics;  Laterality: Right;  . PORT-A-CATH REMOVAL N/A 11/27/2016   Procedure: REMOVAL PORT-A-CATH;  Surgeon: Fanny Skates, MD;  Location: [facility];  Service: General;  Laterality: N/A;  . PORTACATH PLACEMENT N/A 04/14/2016   Procedure: INSERTION PORT-A-CATH ;  Surgeon: Fanny Skates, MD;   Location: [facility];  Service: General;  Laterality: N/A;  . RADIOACTIVE SEED GUIDED PARTIAL MASTECTOMY WITH AXILLARY SENTINEL LYMPH NODE BIOPSY Left 03/18/2016   Procedure: RADIOACTIVE SEED GUIDED LEFT PARTIAL MASTECTOMY WITH AXILLARY SENTINEL LYMPH NODE BIOPSY AND BLUE DYE INJECTION;  Surgeon: Fanny Skates, MD;  Location: [facility];  Service: General;  Laterality: Left;  . REDUCTION MAMMAPLASTY Right 2018     OB History    Gravida  0   Para  0   Term  0   Preterm  0   AB  0   Living  0     SAB  0   IAB  0   Ectopic  0   Multiple  0   Live Births  0           Family History  Problem Relation Age of Onset  . Stroke Mother 63  . Other Mother        history of hysterectomy after last childbirth  . Diabetes Father   . Alzheimer's disease Father   . Hypertension Sister   . Lung cancer Sister 61       smoker  . Other Sister        history of hysterectomy for fibroids  . Breast cancer Sister 70  . Esophageal cancer Maternal Aunt 74       not a smoker  . Heart attack Maternal Uncle 65  . Cirrhosis Sister   . Lung cancer Other        nephew dx. 63s; +smoker  . Epilepsy Other   . Other Other 12       great niece dx. benign ganglioglioma brain tumor; treated at [facility]  . Epilepsy Other        no seizures in 2 years  . Parkinson's disease Maternal Aunt   . Stroke Maternal Grandmother   . Heart Problems Maternal Grandmother   . Pancreatic cancer Cousin 21       paternal 1st cousin  . Colon cancer Neg Hx   . Stomach cancer Neg Hx   . Colon polyps Neg Hx   . Ulcerative colitis Neg Hx     Social History   Tobacco Use  . Smoking status: Never Smoker  . Smokeless tobacco: Never Used  Vaping Use  . Vaping Use: Never used  Substance Use Topics  . Alcohol use: No  . Drug use: No    Home Medications Prior to Admission medications   Medication Sig Start Date End Date Taking? Authorizing Provider  acetaminophen (TYLENOL) 325 MG tablet Take 325 mg by mouth every 6  (six) hours as needed for headache.   Yes [provider]  allopurinol (ZYLOPRIM) 300 MG tablet Take       1 tablet     Daily       to Prevent Gout 10/23/20  Yes [name], MD  blood glucose meter kit and  supplies KIT Dispense based on patient and insurance preference. Use to check blood sugar once daily. Dx: E11.22, N18.30 05/09/20  Yes Liane Comber, NP  Cholecalciferol (VITAMIN D3) 5000 units CAPS Take 5,000 Units by mouth daily.   Yes [provider]  cyanocobalamin 1000 MCG tablet Take 1,000 mcg by mouth daily.   Yes [provider]  dexamethasone (DECADRON) 4 MG tablet Take 2 tabs at the night before and 2 tab the morning of chemotherapy, every 3 weeks, by mouth 04/19/20  Yes Gorsuch, Ni, MD  ezetimibe (ZETIA) 10 MG tablet Take 10 mg by mouth daily. 07/31/20  Yes [provider]  gabapentin (NEURONTIN) 300 MG capsule TAKE 1 CAPSULE BY MOUTH 3 TO 4 TIMES PER DAY AS NEEDED FOR PAIN Patient taking differently: Take 300 mg by mouth 4 (four) times daily as needed (pain). 09/21/20  Yes [name], MD  glimepiride (AMARYL) 1 MG tablet Take 1 tab by mouth in the morning if fasting is running 150+. Please do not take this medication if you are ill or not eating as this can cause a low blood sugar. 05/04/20  Yes Liane Comber, NP  glucose blood (FREESTYLE TEST STRIPS) test strip Check Blood Sugar Daily as directed (Dx: E11.29) 10/28/19  Yes [name], MD  hydrochlorothiazide (HYDRODIURIL) 25 MG tablet Take 25 mg by mouth daily. 09/28/20  Yes [provider]  Lancets (FREESTYLE) lancets CHECK BLOOD SUGAR DAILY AS DIRECTED 09/03/20  Yes [name], MD  lidocaine-prilocaine (EMLA) cream Apply to affected area once Patient taking differently: Apply 1 application topically daily as needed (port access). 04/19/20  Yes Gorsuch, Ni, MD  loratadine (CLARITIN) 10 MG tablet Take 10 mg by mouth daily as needed for allergies.   Yes [provider]  Magnesium 250 MG TABS Take 250 mg by mouth every other day.   Yes [provider]  metoprolol succinate (TOPROL-XL) 25 MG 24 hr tablet TAKE 1 TABLET BY MOUTH DAILY FOR BLOOD PRESSURE Patient taking differently: Take 25 mg by mouth daily. 09/18/20  Yes Liane Comber, NP  ondansetron (ZOFRAN) 8 MG tablet Take 1 tablet (8 mg total) by mouth 2 (two) times daily as needed for refractory nausea / vomiting. Start on day 3 after carboplatin chemo. 04/19/20  Yes Gorsuch, Ni, MD  oxyCODONE (OXY IR/ROXICODONE) 5 MG immediate release tablet Take 1 tablet by mouth every 6 (six) hours as needed for moderate pain or severe pain. 10/20/20  Yes [provider]  prochlorperazine (COMPAZINE) 10 MG tablet Take 1 tablet (10 mg total) by mouth every 6 (six) hours as needed for nausea or vomiting. 05/07/20  Yes Gorsuch, Ni, MD  vitamin C (ASCORBIC ACID) 500 MG tablet Take 500 mg by mouth every evening.    Yes [provider]  enoxaparin (LOVENOX) 30 MG/0.3ML injection daily. 10/24/20   [provider]    Allergies    Keflex [cephalexin], Meloxicam, Prednisone, Premarin [estrogens conjugated], and Trovan [alatrofloxacin]  Review of Systems   Review of Systems  Cardiovascular: Positive for chest pain.  All other systems reviewed and are negative.   Physical Exam Updated Vital Signs BP 127/70   Pulse (!) 104   Temp 97.6 F (36.4 C) (Oral)   Resp 13   Ht 5' 4"  (1.626 m)   Wt 62 kg   SpO2 100%   BMI 23.46 kg/m   Physical Exam Vitals and nursing note reviewed.   84 year old female, resting comfortably and in no acute distress.  Vital signs are significant for slightly elevated heart rate. Oxygen saturation is 100%, which is normal. Head is normocephalic and atraumatic. PERRLA, EOMI. Oropharynx is clear. Neck is nontender and supple without adenopathy or JVD. Back is nontender and there is no CVA tenderness. Lungs are clear without rales, wheezes, or  rhonchi. Chest is nontender.  Mediport is present on the right. Heart has regular rate and rhythm without murmur. Abdomen is soft, flat, with moderate tenderness across the subcostal area.  There is no rebound or guarding.  There are no masses or hepatosplenomegaly and peristalsis is normoactive. Extremities have no cyanosis or edema, full range of motion is present. Skin is warm and dry without rash. Neurologic: Mental status is normal, cranial nerves are intact, there are no motor or sensory deficits.  ED Results / Procedures / Treatments   Labs (all labs ordered are listed, but only abnormal results are displayed) Labs Reviewed  COMPREHENSIVE METABOLIC PANEL - Abnormal; Notable for the following components:      Result Value   Glucose, Bld 140 (*)    BUN 34 (*)    Creatinine, Ser 1.47 (*)    GFR, Estimated 35 (*)    All other components within normal limits  CBC WITH DIFFERENTIAL/PLATELET - Abnormal; Notable for the following components:   WBC 3.6 (*)    RBC 3.22 (*)    Hemoglobin 9.7 (*)    HCT 30.7 (*)    Platelets 134 (*)    All other components within normal limits  LIPASE, BLOOD  TROPONIN I (HIGH SENSITIVITY)  TROPONIN I (HIGH SENSITIVITY)    EKG KARAGAN, LEHR [id number] 24-Nov-2020 04:35:10 [facility]-NLD ROUTINE RECORD Sinus tachycardia Biatrial enlargement Borderline ST depression, diffuse leads No old tracing to compare Confirmed by Delora Fuel ([id number]) on 11/24/2020 4:41:15 AM 22m/s 170mmV 150Hz  9.0.4 CID: [id number] By: DaDelora Fuelent. rate 110 BPM PR interval * ms QRS duration 84 ms QT/QTc 339/459 ms P-R-T axes 85 71 74 [id number]/[birth year]83 yr) Female Caucasian RoOEHO:[id number] Technician: [id number] 121 [id number] ind:  Radiology CT Angio Chest PE W and/or Wo Contrast  Result Date: 11/24/2020 CLINICAL DATA:  Shortness of breath and pain beneath the ribs. History of ovarian cancer with hysterectomy 1 month ago EXAM: CT ANGIOGRAPHY CHEST CT  ABDOMEN AND PELVIS WITH CONTRAST TECHNIQUE: Multidetector CT imaging of the chest was performed using the standard protocol during bolus administration of intravenous contrast. Multiplanar CT image reconstructions and MIPs were obtained to evaluate the vascular anatomy. Multidetector CT imaging of the abdomen and pelvis was performed using the standard protocol during bolus administration of intravenous contrast. CONTRAST:  7539mMNIPAQUE IOHEXOL 350 MG/ML SOLN COMPARISON:  09/19/2020 abdominal CT.  Chest CT 04/23/2020 FINDINGS: CTA CHEST FINDINGS Cardiovascular: Normal heart size. No pericardial effusion. No acute aortic finding. No pulmonary artery filling defect. Porta catheter in good position on the right. Mediastinum/Nodes: Negative for adenopathy. Lungs/Pleura: Pleural based nodule along the right diaphragm has enlarged from October 2021, 2.2 x 0.8 cm as measured on coronal reformats. There is no edema, consolidation, effusion, or pneumothorax. Musculoskeletal: No acute finding. Postoperative left breast. Review of the MIP images confirms the above findings. CT ABDOMEN and PELVIS FINDINGS Hepatobiliary: No focal liver abnormality.No evidence of biliary obstruction or stone. Pancreas: Unremarkable. Spleen: Unremarkable. Adrenals/Urinary Tract: Negative adrenals. No hydronephrosis or stone. Simple 13 mm right upper pole renal cyst. 7 mm fatty mass in the upper pole left kidney . Negative urinary bladder. Stomach/Bowel:  No obstruction. No visible  bowel inflammation Vascular/Lymphatic: No retroperitoneal adenopathy seen today. Atherosclerosis and stable mild cuffing of the infrarenal aorta. Reproductive:Oophorectomy since prior. There has also been debulking with presumed omentectomy. Surgery was [facility] in November 2021. Other: Peritoneal metastatic disease with decreased nodularity along the omentum. A small nodule is seen along the right gutter measuring up to 12 mm. Small volume trapped appearing  peritoneal fluid in the low pelvis with regional peritoneal thickening and nodularity. Musculoskeletal: No acute abnormalities. Review of the MIP images confirms the above findings. IMPRESSION: Chest CTA: 1. Negative for pulmonary embolism or other acute finding. 2. Increased pleural based nodule along the right diaphragm with compared to 09/19/2020, presumably a metastatic manifestation. Abdominal CT: New baseline since interval debulking of peritoneal metastatic disease. Associated ascites is small volume. No bowel obstruction. Electronically Signed   By: Monte Fantasia M.D.   On: 11/24/2020 07:06   CT ABDOMEN PELVIS W CONTRAST  Result Date: 11/24/2020 CLINICAL DATA:  Shortness of breath and pain beneath the ribs. History of ovarian cancer with hysterectomy 1 month ago EXAM: CT ANGIOGRAPHY CHEST CT ABDOMEN AND PELVIS WITH CONTRAST TECHNIQUE: Multidetector CT imaging of the chest was performed using the standard protocol during bolus administration of intravenous contrast. Multiplanar CT image reconstructions and MIPs were obtained to evaluate the vascular anatomy. Multidetector CT imaging of the abdomen and pelvis was performed using the standard protocol during bolus administration of intravenous contrast. CONTRAST:  28m OMNIPAQUE IOHEXOL 350 MG/ML SOLN COMPARISON:  09/19/2020 abdominal CT.  Chest CT 04/23/2020 FINDINGS: CTA CHEST FINDINGS Cardiovascular: Normal heart size. No pericardial effusion. No acute aortic finding. No pulmonary artery filling defect. Porta catheter in good position on the right. Mediastinum/Nodes: Negative for adenopathy. Lungs/Pleura: Pleural based nodule along the right diaphragm has enlarged from October 2021, 2.2 x 0.8 cm as measured on coronal reformats. There is no edema, consolidation, effusion, or pneumothorax. Musculoskeletal: No acute finding. Postoperative left breast. Review of the MIP images confirms the above findings. CT ABDOMEN and PELVIS FINDINGS Hepatobiliary: No  focal liver abnormality.No evidence of biliary obstruction or stone. Pancreas: Unremarkable. Spleen: Unremarkable. Adrenals/Urinary Tract: Negative adrenals. No hydronephrosis or stone. Simple 13 mm right upper pole renal cyst. 7 mm fatty mass in the upper pole left kidney . Negative urinary bladder. Stomach/Bowel:  No obstruction. No visible bowel inflammation Vascular/Lymphatic: No retroperitoneal adenopathy seen today. Atherosclerosis and stable mild cuffing of the infrarenal aorta. Reproductive:Oophorectomy since prior. There has also been debulking with presumed omentectomy. [facility] Other: Peritoneal metastatic disease with decreased nodularity along the omentum. A small nodule is seen along the right gutter measuring up to 12 mm. Small volume trapped appearing peritoneal fluid in the low pelvis with regional peritoneal thickening and nodularity. Musculoskeletal: No acute abnormalities. Review of the MIP images confirms the above findings. IMPRESSION: Chest CTA: 1. Negative for pulmonary embolism or other acute finding. 2. Increased pleural based nodule along the right diaphragm with compared to 09/19/2020, presumably a metastatic manifestation. Abdominal CT: New baseline since interval debulking of peritoneal metastatic disease. Associated ascites is small volume. No bowel obstruction. Electronically Signed   By: JMonte FantasiaM.D.   On: 11/24/2020 07:06   DG Hip Unilat W or Wo Pelvis 2-3 Views Right  Result Date: 11/22/2020 CLINICAL DATA:  Pain.  Edema.  Surgery 11 18. EXAM: DG HIP (WITH OR WITHOUT PELVIS) 2-3V RIGHT COMPARISON:  None. FINDINGS: Surgical clips in the left hemipelvis. Femoral heads are located. No acute fracture.  Mild right hip osteoarthritis, as evidenced by osteophyte formation and mild weight-bearing surface joint space narrowing. IMPRESSION: No acute osseous abnormality. Electronically Signed   By: Abigail Miyamoto M.D.   On: 11/22/2020 16:57    LE VENOUS  Result Date: 11/23/2020  Lower Venous DVT Study Other Indications: RLE radiating pain from hip down leg. ED patient from                    11/22/20 - added on as outpatient. Comparison Study: Prior studies available, last was on 07/14/20 - negative for                   DVT. Performing Technologist: Rogelia Rohrer  Examination Guidelines: A complete evaluation includes B-mode imaging, spectral Doppler, color Doppler, and power Doppler as needed of all accessible portions of each vessel. Bilateral testing is considered an integral part of a complete examination. Limited examinations for reoccurring indications may be performed as noted. The reflux portion of the exam is performed with the patient in reverse Trendelenburg.  +---------+---------------+---------+-----------+----------+--------------+ RIGHT    CompressibilityPhasicitySpontaneityPropertiesThrombus Aging +---------+---------------+---------+-----------+----------+--------------+ CFV      Full           Yes      Yes                                 +---------+---------------+---------+-----------+----------+--------------+ SFJ      Full                                                        +---------+---------------+---------+-----------+----------+--------------+ FV Prox  Full           Yes      Yes                                 +---------+---------------+---------+-----------+----------+--------------+ FV Mid   Full           Yes      Yes                                 +---------+---------------+---------+-----------+----------+--------------+ FV DistalFull           Yes      Yes                                 +---------+---------------+---------+-----------+----------+--------------+ PFV      Full                                                        +---------+---------------+---------+-----------+----------+--------------+ POP      Full           Yes      Yes                                  +---------+---------------+---------+-----------+----------+--------------+ PTV      Full                                                        +---------+---------------+---------+-----------+----------+--------------+  PERO     Full                                                        +---------+---------------+---------+-----------+----------+--------------+   +----+---------------+---------+-----------+----------+--------------+ LEFTCompressibilityPhasicitySpontaneityPropertiesThrombus Aging +----+---------------+---------+-----------+----------+--------------+ CFV Full           Yes      Yes                                 +----+---------------+---------+-----------+----------+--------------+     Summary: RIGHT: - There is no evidence of deep vein thrombosis in the lower extremity.  - No cystic structure found in the popliteal fossa.  LEFT: - No evidence of common femoral vein obstruction.  *See table(s) above for measurements and observations. Electronically signed by Servando Snare MD on 11/23/2020 at 12:42:16 PM.    Final     Procedures Procedures (including critical care time)  Medications Ordered in ED Medications  sodium chloride flush (NS) 0.9 % injection 10-40 mL (has no administration in time range)  sodium chloride flush (NS) 0.9 % injection 10-40 mL (has no administration in time range)  Chlorhexidine Gluconate Cloth 2 % PADS 6 each (has no administration in time range)  morphine 4 MG/ML injection 4 mg (has no administration in time range)  ondansetron (ZOFRAN) injection 4 mg (has no administration in time range)    ED Course  I have reviewed the triage vital signs and the nursing notes.  Pertinent labs & imaging results that were available during my care of the patient were reviewed by me and considered in my medical decision making (see chart for details).    MDM Rules/Calculators/A&P Lower chest/upper abdomen discomfort in patient with  known history of ovarian cancer and is status post recent laparotomy.  ECG does have ST depression in the inferior and anterolateral leads concerning for ischemia.  In addition to cardiac ischemia, need to consider possibility of pulmonary embolism or postoperative complication.  Will check screening labs including troponin and will also send for CT angiogram of the chest as well as CT of abdomen and pelvis.  Old records reviewed confirming negative venous Dopplers yesterday, laparotomy for ovarian cancer on 11/18 and ongoing follow-up by oncology.  CT scans showed no acute process.  Initial labs show stable anemia, stable renal insufficiency, mild thrombocytopenia.  Troponin is normal, delta troponin pending.  Case is signed out to Dr. Rogene Houston.  At this point, I feel that pain is probably mainly muscular and will just need to be treated symptomatically.  Final Clinical Impression(s) / ED Diagnoses Final diagnoses:  Upper abdominal pain  Renal insufficiency  Normochromic normocytic anemia  Thrombocytopenia ([place])    Rx / DC Orders ED Discharge Orders    None       Delora Fuel, MD 95/18/84 0800

## 2020-12-11 ENCOUNTER — Encounter: Payer: Self-pay | Admitting: Oncology

## 2020-12-11 NOTE — Progress Notes (Signed)
Faxed order for [facility] to [phone].

## 2020-12-12 ENCOUNTER — Inpatient Hospital Stay: Payer: PPO | Attending: Gynecologic Oncology

## 2020-12-12 ENCOUNTER — Other Ambulatory Visit: Payer: Self-pay

## 2020-12-12 ENCOUNTER — Other Ambulatory Visit: Payer: Self-pay | Admitting: Hematology and Oncology

## 2020-12-12 ENCOUNTER — Inpatient Hospital Stay: Payer: PPO | Admitting: Hematology and Oncology

## 2020-12-12 ENCOUNTER — Encounter: Payer: Self-pay | Admitting: Hematology and Oncology

## 2020-12-12 ENCOUNTER — Inpatient Hospital Stay: Payer: PPO

## 2020-12-12 DIAGNOSIS — Z79899 Other long term (current) drug therapy: Secondary | ICD-10-CM | POA: Insufficient documentation

## 2020-12-12 DIAGNOSIS — Z5111 Encounter for antineoplastic chemotherapy: Secondary | ICD-10-CM | POA: Diagnosis not present

## 2020-12-12 DIAGNOSIS — N183 Chronic kidney disease, stage 3 unspecified: Secondary | ICD-10-CM | POA: Diagnosis not present

## 2020-12-12 DIAGNOSIS — G620 Drug-induced polyneuropathy: Secondary | ICD-10-CM | POA: Insufficient documentation

## 2020-12-12 DIAGNOSIS — Z170 Estrogen receptor positive status [ER+]: Secondary | ICD-10-CM

## 2020-12-12 DIAGNOSIS — Z79811 Long term (current) use of aromatase inhibitors: Secondary | ICD-10-CM | POA: Diagnosis not present

## 2020-12-12 DIAGNOSIS — C569 Malignant neoplasm of unspecified ovary: Secondary | ICD-10-CM | POA: Diagnosis not present

## 2020-12-12 DIAGNOSIS — Z95828 Presence of other vascular implants and grafts: Secondary | ICD-10-CM

## 2020-12-12 DIAGNOSIS — E1142 Type 2 diabetes mellitus with diabetic polyneuropathy: Secondary | ICD-10-CM | POA: Insufficient documentation

## 2020-12-12 DIAGNOSIS — E1122 Type 2 diabetes mellitus with diabetic chronic kidney disease: Secondary | ICD-10-CM | POA: Diagnosis not present

## 2020-12-12 DIAGNOSIS — T451X5A Adverse effect of antineoplastic and immunosuppressive drugs, initial encounter: Secondary | ICD-10-CM | POA: Diagnosis not present

## 2020-12-12 DIAGNOSIS — C786 Secondary malignant neoplasm of retroperitoneum and peritoneum: Secondary | ICD-10-CM | POA: Insufficient documentation

## 2020-12-12 DIAGNOSIS — C50212 Malignant neoplasm of upper-inner quadrant of left female breast: Secondary | ICD-10-CM | POA: Insufficient documentation

## 2020-12-12 DIAGNOSIS — Z90722 Acquired absence of ovaries, bilateral: Secondary | ICD-10-CM | POA: Insufficient documentation

## 2020-12-12 DIAGNOSIS — C561 Malignant neoplasm of right ovary: Secondary | ICD-10-CM | POA: Diagnosis not present

## 2020-12-12 DIAGNOSIS — E1140 Type 2 diabetes mellitus with diabetic neuropathy, unspecified: Secondary | ICD-10-CM

## 2020-12-12 DIAGNOSIS — D630 Anemia in neoplastic disease: Secondary | ICD-10-CM

## 2020-12-12 DIAGNOSIS — R0789 Other chest pain: Secondary | ICD-10-CM | POA: Insufficient documentation

## 2020-12-12 LAB — CMP (CANCER CENTER ONLY)
ALT: 42 U/L (ref 0–44)
AST: 44 U/L — ABNORMAL HIGH (ref 15–41)
Albumin: 3.8 g/dL (ref 3.5–5.0)
Alkaline Phosphatase: 125 U/L (ref 38–126)
Anion gap: 12 (ref 5–15)
BUN: 31 mg/dL — ABNORMAL HIGH (ref 8–23)
CO2: 24 mmol/L (ref 22–32)
Calcium: 10.3 mg/dL (ref 8.9–10.3)
Chloride: 105 mmol/L (ref 98–111)
Creatinine: 1.37 mg/dL — ABNORMAL HIGH (ref 0.44–1.00)
GFR, Estimated: 39 mL/min — ABNORMAL LOW (ref >60)
Glucose, Bld: 157 mg/dL — ABNORMAL HIGH (ref 70–99)
Potassium: 3.8 mmol/L (ref 3.5–5.1)
Sodium: 141 mmol/L (ref 135–145)
Total Bilirubin: 0.4 mg/dL (ref 0.3–1.2)
Total Protein: 7.2 g/dL (ref 6.5–8.1)

## 2020-12-12 LAB — CBC WITH DIFFERENTIAL (CANCER CENTER ONLY)
Abs Immature Granulocytes: 0.08 10*3/uL — ABNORMAL HIGH (ref 0.00–0.07)
Basophils Absolute: 0 10*3/uL (ref 0.0–0.1)
Basophils Relative: 0 %
Eosinophils Absolute: 0 10*3/uL (ref 0.0–0.5)
Eosinophils Relative: 0 %
HCT: 29.3 % — ABNORMAL LOW (ref 36.0–46.0)
Hemoglobin: 9.7 g/dL — ABNORMAL LOW (ref 12.0–15.0)
Immature Granulocytes: 1 %
Lymphocytes Relative: 9 %
Lymphs Abs: 0.7 10*3/uL (ref 0.7–4.0)
MCH: 30.7 pg (ref 26.0–34.0)
MCHC: 33.1 g/dL (ref 30.0–36.0)
MCV: 92.7 fL (ref 80.0–100.0)
Monocytes Absolute: 0.1 10*3/uL (ref 0.1–1.0)
Monocytes Relative: 2 %
Neutro Abs: 6.6 10*3/uL (ref 1.7–7.7)
Neutrophils Relative %: 88 %
Platelet Count: 115 10*3/uL — ABNORMAL LOW (ref 150–400)
RBC: 3.16 MIL/uL — ABNORMAL LOW (ref 3.87–5.11)
RDW: 16.6 % — ABNORMAL HIGH (ref 11.5–15.5)
WBC Count: 7.5 10*3/uL (ref 4.0–10.5)
nRBC: 0 % (ref 0.0–0.2)

## 2020-12-12 LAB — SAMPLE TO BLOOD BANK

## 2020-12-12 MED ORDER — PALONOSETRON HCL INJECTION 0.25 MG/5ML
0.2500 mg | Freq: Once | INTRAVENOUS | Status: AC
  Administered 2020-12-12: 0.25 mg via INTRAVENOUS

## 2020-12-12 MED ORDER — FOSAPREPITANT DIMEGLUMINE INJECTION 150 MG
150.0000 mg | Freq: Once | INTRAVENOUS | Status: AC
  Administered 2020-12-12: 150 mg via INTRAVENOUS
  Filled 2020-12-12: qty 150

## 2020-12-12 MED ORDER — GABAPENTIN 300 MG PO CAPS: ORAL_CAPSULE | ORAL | 3 refills | Status: DC

## 2020-12-12 MED ORDER — SODIUM CHLORIDE 0.9 % IV SOLN
Freq: Once | INTRAVENOUS | Status: AC
  Filled 2020-12-12: qty 250

## 2020-12-12 MED ORDER — SODIUM CHLORIDE 0.9 % IV SOLN
249.7500 mg | Freq: Once | INTRAVENOUS | Status: AC
  Administered 2020-12-12: 250 mg via INTRAVENOUS
  Filled 2020-12-12: qty 25

## 2020-12-12 MED ORDER — SODIUM CHLORIDE 0.9 % IV SOLN
87.5000 mg/m2 | Freq: Once | INTRAVENOUS | Status: AC
  Administered 2020-12-12: 144 mg via INTRAVENOUS
  Filled 2020-12-12: qty 24

## 2020-12-12 MED ORDER — SODIUM CHLORIDE 0.9% FLUSH
10.0000 mL | INTRAVENOUS | Status: DC | PRN
  Administered 2020-12-12: 10 mL
  Filled 2020-12-12: qty 10

## 2020-12-12 MED ORDER — HEPARIN SOD (PORK) LOCK FLUSH 100 UNIT/ML IV SOLN
500.0000 [IU] | Freq: Once | INTRAVENOUS | Status: AC | PRN
  Administered 2020-12-12: 500 [IU]
  Filled 2020-12-12: qty 5

## 2020-12-12 MED ORDER — DIPHENHYDRAMINE HCL 50 MG/ML IJ SOLN
50.0000 mg | Freq: Once | INTRAMUSCULAR | Status: AC
  Administered 2020-12-12: 50 mg via INTRAVENOUS

## 2020-12-12 MED ORDER — DIPHENHYDRAMINE HCL 50 MG/ML IJ SOLN
INTRAMUSCULAR | Status: AC
  Filled 2020-12-12: qty 1

## 2020-12-12 MED ORDER — PALONOSETRON HCL INJECTION 0.25 MG/5ML
INTRAVENOUS | Status: AC
  Filled 2020-12-12: qty 5

## 2020-12-12 MED ORDER — SODIUM CHLORIDE 0.9% FLUSH
10.0000 mL | Freq: Once | INTRAVENOUS | Status: AC
  Administered 2020-12-12: 10 mL
  Filled 2020-12-12: qty 10

## 2020-12-12 MED ORDER — FAMOTIDINE IN NACL 20-0.9 MG/50ML-% IV SOLN
20.0000 mg | Freq: Once | INTRAVENOUS | Status: AC
  Administered 2020-12-12: 20 mg via INTRAVENOUS

## 2020-12-12 MED ORDER — OMEPRAZOLE 20 MG PO CPDR: 20.0000 mg | DELAYED_RELEASE_CAPSULE | Freq: Every day | ORAL | 11 refills | Status: DC

## 2020-12-12 MED ORDER — FAMOTIDINE IN NACL 20-0.9 MG/50ML-% IV SOLN
INTRAVENOUS | Status: AC
  Filled 2020-12-12: qty 50

## 2020-12-12 MED ORDER — SODIUM CHLORIDE 0.9 % IV SOLN
10.0000 mg | Freq: Once | INTRAVENOUS | Status: AC
  Administered 2020-12-12: 10 mg via INTRAVENOUS
  Filled 2020-12-12: qty 10

## 2020-12-12 NOTE — Progress Notes (Signed)
[facility] OFFICE PROGRESS NOTE  Patient Care Team: [name], MD as PCP - Kinnie Scales, OD as Referring Physician (Optometry) [name], MD as Consulting Physician (Dermatology) Latanya Maudlin, MD as Consulting Physician (Orthopedic Surgery) Inda Castle, MD (Inactive) as Consulting Physician (Gastroenterology) Jacqulyn Liner, RN as Oncology Nurse Navigator (Oncology) Heath Lark, MD as Consulting Physician (Hematology and Oncology)  ASSESSMENT & PLAN:  Ovarian cancer [facility]) I have reviewed recent CT imaging done in the emergency department Some of the subtle findings including the lung nodule, peritoneal nodules and others were reviewed There were no specific cause of her chest pain I suspect she might have GERD We will proceed with treatment as scheduled I plan to complete 1 more cycle next month before repeating imaging study to follow  Anemia in neoplastic disease This is likely due to recent treatment. The patient denies recent history of bleeding such as epistaxis, hematuria or hematochezia. She is asymptomatic from the anemia. I will observe for now.  She does not require transfusion now. I will continue the chemotherapy at current dose without dosage adjustment.  If the anemia gets progressive worse in the future, I might have to delay her treatment or adjust the chemotherapy dose.   Chemotherapy-induced peripheral neuropathy (HCC) She has worsening grade 2 neuropathy I recommend consideration of cryotherapy during treatment I also recommend increasing her gabapentin dose at nighttime We discussed the risk and benefits of increased dose gabapentin I plan to reduce the dose of Taxol a little bit  CKD stage 3 due to type 2 diabetes mellitus ([place]) She has significant fluctuation of her serum creatinine I will adjust the dose of chemotherapy accordingly  Atypical chest pain I suspect she might have symptoms of reflux I recommend daily  proton pump inhibitor   No orders of the defined types were placed in this encounter.   All questions were answered. The patient knows to call the clinic with any problems, questions or concerns. The total time spent in the appointment was 40 minutes encounter with patients including review of chart and various tests results, discussions about plan of care and coordination of care plan   Heath Lark, MD 12/12/2020 11:16 AM  INTERVAL HISTORY: Please see below for problem oriented charting. She returns with her husband for further chemotherapy and follow-up She went to the emergency department twice last month for evaluation of atypical chest pain, abdominal discomfort and shortness of breath She had CT imaging with multiple different findings Those symptoms have resolved She complained of worsening peripheral neuropathy  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[place])  02/26/2016 Initial Diagnosis   Left breast biopsy 11:00 position: invasive ductal carcinoma with DCIS, ER 90%, PR 10%, HER-2 negative, Ki-67 30%, grade 2, 2.2 cm palpable lesion T2 N0 stage II a clinical stage   03/18/2016 Surgery   Left lumpectomy: Invasive ductal carcinoma, grade 2, 6.3 cm, with high-grade DCIS, margins negative, 0/4 lymph nodes negative, ER 90%, via 10%, HER-2 negative ratio 0.97, Ki-67 30%, T3 N0 stage IIB   03/25/2016 Procedure   Genetic testing is negative for pathogenic mutations within any of the 20 Genes on the breast/ovarian cancer panel   04/04/2016 Oncotype testing   Oncotype DX recurrence score 37, 25% 10 year distant risk of recurrence   04/17/2016 - 07/31/2016 Chemotherapy   Adjuvant chemotherapy with dose dense Adriamycin and Cytoxan followed by Abraxane weekly 8 ( discontinued due to  neuropathy)   09/01/2016 - 09/26/2016 Radiation Therapy   Adj XRT 1) Left breast: 42.5 Gy in 17 fractions. 2) Left breast boost: 7.5 Gy in 3  fractions.   12/02/2016 -  Anti-estrogen oral therapy   Anastrozole 1 mg daily   Ovarian cancer ([place])  04/03/2020 Imaging   US pelvis Complex cystic and solid mass in LEFT adnexa 12.5 cm diameter question cystic ovarian neoplasm; recommend correlation with serum tumor markers and further evaluation by MR imaging with and without contrast.     04/04/2020 Imaging   US venous Doppler No evidence of deep venous thrombosis in the right lower extremity. Left common femoral vein also patent.   04/15/2020 Imaging   MRI pelvis 1. Large complex solid and cystic mass arising from the right adnexa measuring 10.8 by 11.5 by 11.1 cm. This has an aggressive appearance with extensive enhancing mural soft tissue components. Findings are highly suspicious for malignant ovarian neoplasm. 2. Extensive bilateral retroperitoneal and bilateral iliac adenopathy compatible with metastatic disease. 3. Signs of extensive peritoneal carcinomatosis including ascites, enhancement, thickening and nodularity of the peritoneal reflections, omental caking and bulky peritoneal nodularity. 4. Suspected serosal involvement of the dome of bladder with loss of normal fat plane. Cannot rule out mural invasion by tumor.   04/17/2020 Tumor Marker   Patient's tumor was tested for the following markers: CA-125 Results of the tumor marker test revealed 1762.   04/20/2020 Cancer Staging   Staging form: Ovary, Fallopian Tube, and Primary Peritoneal Carcinoma, AJCC 8th Edition - Clinical: FIGO Stage IIIC (cT3, cN1, cM0) - Signed by Heath Lark, MD on 04/20/2020   04/23/2020 Imaging   1. Redemonstrated dominant mixed solid and cystic mass arising from the vicinity of the right ovary measuring at least 11.5 x 10.7 cm, not significantly changed compared to prior MR, and consistent with primary ovarian malignancy.  2. Numerous bulky retroperitoneal, bilateral iliac, and pelvic sidewall lymph nodes. 3. Moderate volume ascites throughout the  abdomen and pelvis with subtle thickening and nodularity throughout the peritoneum, and extensive bulky nodular metastatic disease of the omentum. 4. Constellation of findings is consistent with advanced nodal and peritoneal metastatic disease. 5. There are prominent subcentimeter epicardial lymph nodes, nonspecific although suspicious for nodal metastatic disease. No definite nodal metastatic disease in the chest. Attention on follow-up. 6. There are multiple small subpleural nodules at the right lung base overlying the diaphragm, measuring up to 7 mm. These are generally nonspecific and less favored to represent pulmonary metastatic disease given distribution. Attention on follow-up. 7. Somewhat coarse contour of the liver, suggestive of cirrhosis, although out without overt morphologic stigmata. 8. Aortic Atherosclerosis (ICD10-I70.0).     04/24/2020 Procedure   Successful placement of a right internal jugular approach power injectable Port-A-Cath. The catheter is ready for immediate use.     05/02/2020 Tumor Marker   Patient's tumor was tested for the following markers: CA-125 Results of the tumor marker test revealed 1921   05/03/2020 -  Chemotherapy   The patient had carboplatin and taxol for chemotherapy treatment.     05/08/2020 Procedure   Successful ultrasound-guided paracentesis yielding 2.6 liters of peritoneal fluid.   05/16/2020 Procedure   Successful ultrasound-guided paracentesis yielding 3.2 liters of peritoneal fluid   05/25/2020 Procedure   Successful ultrasound-guided paracentesis yielding 3 liters of peritoneal fluid.     06/01/2020 Procedure   Successful ultrasound-guided therapeutic paracentesis yielding 1.7 liters of peritoneal fluid.   06/14/2020 Tumor Marker   Patient's tumor was tested for  the following markers: CA-125 Results of the tumor marker test revealed 1309   06/20/2020 Imaging   1. Dominant mixed cystic and solid mass in the central pelvis is stable in the  interval. 2. Clear interval decrease in retroperitoneal and pelvic sidewall lymphadenopathy. The bulky omental disease has also clearly decreased in the interval. 3. Interval decrease in ascites. 4. Stable 8 mm subpleural nodule along the diaphragm. 5. Aortic Atherosclerosis (ICD10-I70.0).   07/10/2020 Tumor Marker   Patient's tumor was tested for the following markers: CA-125 Results of the tumor marker test revealed 220   08/03/2020 Tumor Marker   Patient's tumor was tested for the following markers: CA-125 Results of the tumor marker test revealed 53.3.   09/03/2020 Tumor Marker   Patient's tumor was tested for the following markers: CA-125 Results of the tumor marker test revealed 29.7   09/20/2020 Imaging   1. Substantial improvement. The right ovarian cystic and solid mass is half the volume that it measured on 06/20/2020. Similar reduction in the bulk of the omental caking of tumor. Prior ascites is resolved and the retroperitoneal adenopathy is markedly improved. 2. Other imaging findings of potential clinical significance: Stable pleural-based nodularity along the right hemidiaphragm measuring about 1.1 by 0.7 by 0.3 cm. Stable hypodense lesion of the right kidney upper pole, probably a cyst although the configuration of this lesion makes it difficult to obtain accurate density measurements. Left ovarian cyst, stable. Chondrocalcinosis involving the acetabular labra. Mild lumbar spondylosis and degenerative disc disease. 3. Aortic atherosclerosis.     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 (stage ypT3c), predominantly viable with focal fibrosis and hemosiderin laden macrophages (possible mild treatment effect)  B: Ovary and fallopian tube, right,  salpingo-oophorectomy - High grade serous carcinoma involving right ovary and fallopian tube with surface involvement, size up to 8 cm  - Areas of necrosis and hemosiderin laden macrophages suggestive of treatment effect - 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 - See synoptic report and comment  C: Ovary and fallopian tube, left, salpingo-oophorectomy - High grade serous carcinoma involving the left fallopian tube, size up to 0.5 cm - Ovary with no definite involvement by carcinoma identified - Nodular area of endometriosis and peritoneal inclusion cyst also present - See synoptic report and comment   Procedure:    Bilateral salpingo-oophorectomy    Procedure:    Omentectomy    Specimen Integrity of Right Ovary:    Intact with surface involvement by tumor    Specimen Integrity of Left Ovary:    Capsule intact   TUMOR Tumor Site:    Right fallopian tube: favor as primary site; also involves right ovary and left fallopian tube  Histologic Type:    Serous carcinoma  Histologic Grade:    High grade  Tumor Size:    Greatest Dimension (Centimeters): in fallopian tube: 1.0 cm; in ovary: 8.0 cm Ovarian Surface Involvement:    Present    Laterality:    Right  Fallopian Tube Surface Involvement:    Present    Laterality:    Bilateral  Other Tissue / Organ Involvement:    Right ovary  Other Tissue / Organ Involvement:    Right fallopian tube  Other Tissue / Organ Involvement:    Left fallopian tube  Other Tissue / Organ  Involvement:    Omentum  Largest Extrapelvic Peritoneal Focus:    Macroscopic (greater than 2 cm)  Peritoneal / Ascitic Fluid:    Not submitted / unknown  Pleural Fluid:    Not submitted / unknown  Treatment Effect:    No definite or minimal response identified (chemotherapy response score [CRS] 1)   LYMPH NODES Regional Lymph Nodes:    No lymph nodes submitted or found   PATHOLOGIC STAGE CLASSIFICATION (pTNM, AJCC 8th Edition) TNM  Descriptors:    y (post-treatment)  Primary Tumor (pT):    pT3c  Regional Lymph Nodes (pN):    pNX   FIGO STAGE FIGO Stage:    IIIC   ADDITIONAL FINDINGS Additional Findings:    Serous tubal intraepithelial carcinoma (STIC)  Additional Findings:    Left ovary with no definite carcinoma identified, left-sided nodule of endometriosis and peritoneal inclusion cyst    10/18/2020 - 10/20/2020 Hospital Admission   She was admitted to [facility] hospital for interval debulking surgery   11/19/2020 Tumor Marker   Patient's tumor was tested for the following markers: CA-125. Results of the tumor marker test revealed 38.3     REVIEW OF SYSTEMS:   Constitutional: Denies fevers, chills or abnormal weight loss Eyes: Denies blurriness of vision Ears, nose, mouth, throat, and face: Denies mucositis or sore throat Gastrointestinal:  Denies nausea, heartburn or change in bowel habits Skin: Denies abnormal skin rashes Lymphatics: Denies new lymphadenopathy or easy bruising Behavioral/Psych: Mood is stable, no new changes  All other systems were reviewed with the patient and are negative.  I have reviewed the past medical history, past surgical history, social history and family history with the patient and they are unchanged from previous note.  ALLERGIES:  is allergic to keflex [cephalexin], meloxicam, prednisone, premarin [estrogens conjugated], and trovan [alatrofloxacin].  MEDICATIONS:  Current Outpatient Medications  Medication Sig Dispense Refill  . omeprazole (PRILOSEC) 20 MG capsule Take 1 capsule (20 mg total) by mouth daily. 30 capsule 11  . acetaminophen (TYLENOL) 325 MG tablet Take 325 mg by mouth every 6 (six) hours as needed for headache.    . allopurinol (ZYLOPRIM) 300 MG tablet Take       1 tablet     Daily       to Prevent Gout 90 tablet 0  . blood glucose meter kit and supplies KIT Dispense based on patient and insurance preference. Use to check blood sugar once daily. Dx: E11.22,  N18.30 1 each 12  . Cholecalciferol (VITAMIN D3) 5000 units CAPS Take 5,000 Units by mouth daily.    . cyanocobalamin 1000 MCG tablet Take 1,000 mcg by mouth daily.    Marland Kitchen dexamethasone (DECADRON) 4 MG tablet Take 2 tabs at the night before and 2 tab the morning of chemotherapy, every 3 weeks, by mouth 36 tablet 5  . ezetimibe (ZETIA) 10 MG tablet Take 10 mg by mouth daily.    Marland Kitchen gabapentin (NEURONTIN) 300 MG capsule Take 1 capsule in the morning and afternoon and 2 capsules at night 360 capsule 3  . glimepiride (AMARYL) 1 MG tablet Take 1 tab by mouth in the morning if fasting is running 150+. Please do not take this medication if you are ill or not eating as this can cause a low blood sugar. 90 tablet 1  . glucose blood (FREESTYLE TEST STRIPS) test strip Check Blood Sugar Daily as directed (Dx: E11.29) 100 strip 3  . hydrochlorothiazide (HYDRODIURIL) 25 MG tablet Take 25 mg by mouth  daily.    . Lancets (FREESTYLE) lancets CHECK BLOOD SUGAR DAILY AS DIRECTED 100 each 3  . lidocaine-prilocaine (EMLA) cream Apply to affected area once (Patient taking differently: Apply 1 application topically daily as needed (port access).) 30 g 3  . loratadine (CLARITIN) 10 MG tablet Take 10 mg by mouth daily as needed for allergies.    . Magnesium 250 MG TABS Take 250 mg by mouth every other day.    . metoprolol succinate (TOPROL-XL) 25 MG 24 hr tablet TAKE 1 TABLET BY MOUTH DAILY FOR BLOOD PRESSURE (Patient taking differently: Take 25 mg by mouth daily.) 90 tablet 3  . ondansetron (ZOFRAN) 8 MG tablet Take 1 tablet (8 mg total) by mouth 2 (two) times daily as needed for refractory nausea / vomiting. Start on day 3 after carboplatin chemo. 30 tablet 1  . oxyCODONE (OXY IR/ROXICODONE) 5 MG immediate release tablet Take 1 tablet by mouth every 6 (six) hours as needed for moderate pain or severe pain.    Marland Kitchen prochlorperazine (COMPAZINE) 10 MG tablet Take 1 tablet (10 mg total) by mouth every 6 (six) hours as needed for  nausea or vomiting. 60 tablet 9  . vitamin C (ASCORBIC ACID) 500 MG tablet Take 500 mg by mouth every evening.      No current facility-administered medications for this visit.   Facility-Administered Medications Ordered in Other Visits  Medication Dose Route Frequency Provider Last Rate Last Admin  . CARBOplatin (PARAPLATIN) 250 mg in sodium chloride 0.9 % 250 mL chemo infusion  250 mg Intravenous Once [name], Kendle Erker, MD      . dexamethasone (DECADRON) 10 mg in sodium chloride 0.9 % 50 mL IVPB  10 mg Intravenous Once [name], Caroline Matters, MD      . famotidine (PEPCID) IVPB 20 mg premix  20 mg Intravenous Once [name], Terrea Bruster, MD 200 mL/hr at 12/12/20 1113 20 mg at 12/12/20 1113  . fosaprepitant (EMEND) 150 mg in sodium chloride 0.9 % 145 mL IVPB  150 mg Intravenous Once [name], Noni Stonesifer, MD      . heparin lock flush 100 unit/mL  500 Units Intracatheter Once PRN [name], Kajsa Butrum, MD      . PACLitaxel (TAXOL) 144 mg in sodium chloride 0.9 % 250 mL chemo infusion (> 56m/m2)  87.5 mg/m2 (Treatment Plan Recorded) Intravenous Once Rudie Rikard, MD      . sodium chloride flush (NS) 0.9 % injection 10 mL  10 mL Intracatheter PRN GAlvy Bimler Irma Roulhac, MD        PHYSICAL EXAMINATION: ECOG PERFORMANCE STATUS: 2 - Symptomatic, <50% confined to bed  Vitals:   12/12/20 0948  BP: 137/70  Pulse: 88  Resp: 18  Temp: (!) 95 F (35 C)  SpO2: 100%   Filed Weights   12/12/20 0948  Weight: 134 lb 6.4 oz (61 kg)    GENERAL:alert, no distress and comfortable NEURO: alert & oriented x 3 with fluent speech, no focal motor/sensory deficits  LABORATORY DATA:  I have reviewed the data as listed    Component Value Date/Time   NA 141 12/12/2020 0936   NA 141 11/06/2016 1102   K 3.8 12/12/2020 0936   K 3.9 11/06/2016 1102   CL 105 12/12/2020 0936   CO2 24 12/12/2020 0936   CO2 27 11/06/2016 1102   GLUCOSE 157 (H) 12/12/2020 0936   GLUCOSE 130 11/06/2016 1102   BUN 31 (H) 12/12/2020 0936   BUN 18.0 11/06/2016 1102   CREATININE 1.37  (H) 12/12/2020 03474  CREATININE 1.17 (H) 11/01/2020 1032   CREATININE 1.3 (H) 11/06/2016 1102   CALCIUM 10.3 12/12/2020 0936   CALCIUM 10.2 11/06/2016 1102   PROT 7.2 12/12/2020 0936   PROT 7.1 11/06/2016 1102   ALBUMIN 3.8 12/12/2020 0936   ALBUMIN 3.7 11/06/2016 1102   AST 44 (H) 12/12/2020 0936   AST 52 (H) 11/06/2016 1102   ALT 42 12/12/2020 0936   ALT 58 (H) 11/06/2016 1102   ALKPHOS 125 12/12/2020 0936   ALKPHOS 159 (H) 11/06/2016 1102   BILITOT 0.4 12/12/2020 0936   BILITOT 0.53 11/06/2016 1102   GFRNONAA 39 (L) 12/12/2020 0936   GFRNONAA 43 (L) 11/01/2020 1032   GFRAA 50 (L) 11/01/2020 1032    No results found for: SPEP, UPEP  Lab Results  Component Value Date   WBC 7.5 12/12/2020   NEUTROABS 6.6 12/12/2020   HGB 9.7 (L) 12/12/2020   HCT 29.3 (L) 12/12/2020   MCV 92.7 12/12/2020   PLT 115 (L) 12/12/2020      Chemistry      Component Value Date/Time   NA 141 12/12/2020 0936   NA 141 11/06/2016 1102   K 3.8 12/12/2020 0936   K 3.9 11/06/2016 1102   CL 105 12/12/2020 0936   CO2 24 12/12/2020 0936   CO2 27 11/06/2016 1102   BUN 31 (H) 12/12/2020 0936   BUN 18.0 11/06/2016 1102   CREATININE 1.37 (H) 12/12/2020 0936   CREATININE 1.17 (H) 11/01/2020 1032   CREATININE 1.3 (H) 11/06/2016 1102      Component Value Date/Time   CALCIUM 10.3 12/12/2020 0936   CALCIUM 10.2 11/06/2016 1102   ALKPHOS 125 12/12/2020 0936   ALKPHOS 159 (H) 11/06/2016 1102   AST 44 (H) 12/12/2020 0936   AST 52 (H) 11/06/2016 1102   ALT 42 12/12/2020 0936   ALT 58 (H) 11/06/2016 1102   BILITOT 0.4 12/12/2020 0936   BILITOT 0.53 11/06/2016 1102       RADIOGRAPHIC STUDIES: I have reviewed multiple imaging studies with the patient and husband I have personally reviewed the radiological images as listed and agreed with the findings in the report. CT Angio Chest PE W and/or Wo Contrast  Result Date: 11/24/2020 CLINICAL DATA:  Shortness of breath and pain beneath the ribs. History  of ovarian cancer with hysterectomy 1 month ago EXAM: CT ANGIOGRAPHY CHEST CT ABDOMEN AND PELVIS WITH CONTRAST TECHNIQUE: Multidetector CT imaging of the chest was performed using the standard protocol during bolus administration of intravenous contrast. Multiplanar CT image reconstructions and MIPs were obtained to evaluate the vascular anatomy. Multidetector CT imaging of the abdomen and pelvis was performed using the standard protocol during bolus administration of intravenous contrast. CONTRAST:  62m OMNIPAQUE IOHEXOL 350 MG/ML SOLN COMPARISON:  09/19/2020 abdominal CT.  Chest CT 04/23/2020 FINDINGS: CTA CHEST FINDINGS Cardiovascular: Normal heart size. No pericardial effusion. No acute aortic finding. No pulmonary artery filling defect. Porta catheter in good position on the right. Mediastinum/Nodes: Negative for adenopathy. Lungs/Pleura: Pleural based nodule along the right diaphragm has enlarged from October 2021, 2.2 x 0.8 cm as measured on coronal reformats. There is no edema, consolidation, effusion, or pneumothorax. Musculoskeletal: No acute finding. Postoperative left breast. Review of the MIP images confirms the above findings. CT ABDOMEN and PELVIS FINDINGS Hepatobiliary: No focal liver abnormality.No evidence of biliary obstruction or stone. Pancreas: Unremarkable. Spleen: Unremarkable. Adrenals/Urinary Tract: Negative adrenals. No hydronephrosis or stone. Simple 13 mm right upper pole renal cyst. 7 mm fatty mass in the  upper pole left kidney . Negative urinary bladder. Stomach/Bowel:  No obstruction. No visible bowel inflammation Vascular/Lymphatic: No retroperitoneal adenopathy seen today. Atherosclerosis and stable mild cuffing of the infrarenal aorta. Reproductive:Oophorectomy since prior. There has also been debulking with presumed omentectomy. Surgery was performed at [facility] in November 2021. Other: Peritoneal metastatic disease with decreased nodularity along the omentum. A small nodule is seen  along the right gutter measuring up to 12 mm. Small volume trapped appearing peritoneal fluid in the low pelvis with regional peritoneal thickening and nodularity. Musculoskeletal: No acute abnormalities. Review of the MIP images confirms the above findings. IMPRESSION: Chest CTA: 1. Negative for pulmonary embolism or other acute finding. 2. Increased pleural based nodule along the right diaphragm with compared to 09/19/2020, presumably a metastatic manifestation. Abdominal CT: New baseline since interval debulking of peritoneal metastatic disease. Associated ascites is small volume. No bowel obstruction. Electronically Signed   By: Monte Fantasia M.D.   On: 11/24/2020 07:06   CT ABDOMEN PELVIS W CONTRAST  Result Date: 11/24/2020 CLINICAL DATA:  Shortness of breath and pain beneath the ribs. History of ovarian cancer with hysterectomy 1 month ago EXAM: CT ANGIOGRAPHY CHEST CT ABDOMEN AND PELVIS WITH CONTRAST TECHNIQUE: Multidetector CT imaging of the chest was performed using the standard protocol during bolus administration of intravenous contrast. Multiplanar CT image reconstructions and MIPs were obtained to evaluate the vascular anatomy. Multidetector CT imaging of the abdomen and pelvis was performed using the standard protocol during bolus administration of intravenous contrast. CONTRAST:  62m OMNIPAQUE IOHEXOL 350 MG/ML SOLN COMPARISON:  09/19/2020 abdominal CT.  Chest CT 04/23/2020 FINDINGS: CTA CHEST FINDINGS Cardiovascular: Normal heart size. No pericardial effusion. No acute aortic finding. No pulmonary artery filling defect. Porta catheter in good position on the right. Mediastinum/Nodes: Negative for adenopathy. Lungs/Pleura: Pleural based nodule along the right diaphragm has enlarged from October 2021, 2.2 x 0.8 cm as measured on coronal reformats. There is no edema, consolidation, effusion, or pneumothorax. Musculoskeletal: No acute finding. Postoperative left breast. Review of the MIP images  confirms the above findings. CT ABDOMEN and PELVIS FINDINGS Hepatobiliary: No focal liver abnormality.No evidence of biliary obstruction or stone. Pancreas: Unremarkable. Spleen: Unremarkable. Adrenals/Urinary Tract: Negative adrenals. No hydronephrosis or stone. Simple 13 mm right upper pole renal cyst. 7 mm fatty mass in the upper pole left kidney . Negative urinary bladder. Stomach/Bowel:  No obstruction. No visible bowel inflammation Vascular/Lymphatic: No retroperitoneal adenopathy seen today. Atherosclerosis and stable mild cuffing of the infrarenal aorta. Reproductive:Oophorectomy since prior. There has also been debulking with presumed omentectomy. [facility] November 2021. Other: Peritoneal metastatic disease with decreased nodularity along the omentum. A small nodule is seen along the right gutter measuring up to 12 mm. Small volume trapped appearing peritoneal fluid in the low pelvis with regional peritoneal thickening and nodularity. Musculoskeletal: No acute abnormalities. Review of the MIP images confirms the above findings. IMPRESSION: Chest CTA: 1. Negative for pulmonary embolism or other acute finding. 2. Increased pleural based nodule along the right diaphragm with compared to 09/19/2020, presumably a metastatic manifestation. Abdominal CT: New baseline since interval debulking of peritoneal metastatic disease. Associated ascites is small volume. No bowel obstruction. Electronically Signed   By: JMonte FantasiaM.D.   On: 11/24/2020 07:06   DG Hip Unilat W or Wo Pelvis 2-3 Views Right  Result Date: 11/22/2020 CLINICAL DATA:  Pain.  Edema.  Surgery 11 18. EXAM: DG HIP (WITH OR WITHOUT PELVIS) 2-3V RIGHT COMPARISON:  None.  FINDINGS: Surgical clips in the left hemipelvis. Femoral heads are located. No acute fracture. Mild right hip osteoarthritis, as evidenced by osteophyte formation and mild weight-bearing surface joint space narrowing. IMPRESSION: No acute osseous  abnormality. Electronically Signed   By: Abigail Miyamoto M.D.   On: 11/22/2020 16:57   LE VENOUS  Result Date: 11/23/2020  Lower Venous DVT Study Other Indications: RLE radiating pain from hip down leg. ED patient from                    11/22/20 - added on as outpatient. Comparison Study: Prior studies available, last was on 07/14/20 - negative for                   DVT. Performing Technologist: Rogelia Rohrer  Examination Guidelines: A complete evaluation includes B-mode imaging, spectral Doppler, color Doppler, and power Doppler as needed of all accessible portions of each vessel. Bilateral testing is considered an integral part of a complete examination. Limited examinations for reoccurring indications may be performed as noted. The reflux portion of the exam is performed with the patient in reverse Trendelenburg.  +---------+---------------+---------+-----------+----------+--------------+ RIGHT    CompressibilityPhasicitySpontaneityPropertiesThrombus Aging +---------+---------------+---------+-----------+----------+--------------+ CFV      Full           Yes      Yes                                 +---------+---------------+---------+-----------+----------+--------------+ SFJ      Full                                                        +---------+---------------+---------+-----------+----------+--------------+ FV Prox  Full           Yes      Yes                                 +---------+---------------+---------+-----------+----------+--------------+ FV Mid   Full           Yes      Yes                                 +---------+---------------+---------+-----------+----------+--------------+ FV DistalFull           Yes      Yes                                 +---------+---------------+---------+-----------+----------+--------------+ PFV      Full                                                         +---------+---------------+---------+-----------+----------+--------------+ POP      Full           Yes      Yes                                 +---------+---------------+---------+-----------+----------+--------------+  PTV      Full                                                        +---------+---------------+---------+-----------+----------+--------------+ PERO     Full                                                        +---------+---------------+---------+-----------+----------+--------------+   +----+---------------+---------+-----------+----------+--------------+ LEFTCompressibilityPhasicitySpontaneityPropertiesThrombus Aging +----+---------------+---------+-----------+----------+--------------+ CFV Full           Yes      Yes                                 +----+---------------+---------+-----------+----------+--------------+     Summary: RIGHT: - There is no evidence of deep vein thrombosis in the lower extremity.  - No cystic structure found in the popliteal fossa.  LEFT: - No evidence of common femoral vein obstruction.  *See table(s) above for measurements and observations. Electronically signed by Servando Snare MD on 11/23/2020 at 12:42:16 PM.    Final

## 2020-12-12 NOTE — Assessment & Plan Note (Signed)
I suspect she might have symptoms of reflux I recommend daily proton pump inhibitor

## 2020-12-12 NOTE — Assessment & Plan Note (Signed)
This is likely due to recent treatment. The patient denies recent history of bleeding such as epistaxis, hematuria or hematochezia. She is asymptomatic from the anemia. I will observe for now.  She does not require transfusion now. I will continue the chemotherapy at current dose without dosage adjustment.  If the anemia gets progressive worse in the future, I might have to delay her treatment or adjust the chemotherapy dose.

## 2020-12-12 NOTE — Assessment & Plan Note (Signed)
She has worsening grade 2 neuropathy I recommend consideration of cryotherapy during treatment I also recommend increasing her gabapentin dose at nighttime We discussed the risk and benefits of increased dose gabapentin I plan to reduce the dose of Taxol a little bit

## 2020-12-12 NOTE — Assessment & Plan Note (Signed)
She has significant fluctuation of her serum creatinine I will adjust the dose of chemotherapy accordingly

## 2020-12-12 NOTE — Assessment & Plan Note (Signed)
I have reviewed recent CT imaging done in the emergency department Some of the subtle findings including the lung nodule, peritoneal nodules and others were reviewed There were no specific cause of her chest pain I suspect she might have GERD We will proceed with treatment as scheduled I plan to complete 1 more cycle next month before repeating imaging study to follow

## 2020-12-12 NOTE — Patient Instructions (Signed)
[facility] Discharge Instructions for Patients Receiving Chemotherapy  Today you received the following chemotherapy agents Paclitaxel (TAXOL) & Carboplatin (PARAPLATIN).  To help prevent nausea and vomiting after your treatment, we encourage you to take your nausea medication as prescribed.  If you develop nausea and vomiting that is not controlled by your nausea medication, call the clinic.   BELOW ARE SYMPTOMS THAT SHOULD BE REPORTED IMMEDIATELY:  *FEVER GREATER THAN 100.5 F  *CHILLS WITH OR WITHOUT FEVER  NAUSEA AND VOMITING THAT IS NOT CONTROLLED WITH YOUR NAUSEA MEDICATION  *UNUSUAL SHORTNESS OF BREATH  *UNUSUAL BRUISING OR BLEEDING  TENDERNESS IN MOUTH AND THROAT WITH OR WITHOUT PRESENCE OF ULCERS  *URINARY PROBLEMS  *BOWEL PROBLEMS  UNUSUAL RASH Items with * indicate a potential emergency and should be followed up as soon as possible.  Feel free to call the clinic should you have any questions or concerns. The clinic phone number is [phone].  Please show the [place] at check-in to the Emergency Department and triage nurse.

## 2020-12-13 LAB — CA 125: Cancer Antigen (CA) 125: 42.3 U/mL — ABNORMAL HIGH (ref 0.0–38.1)

## 2020-12-20 ENCOUNTER — Encounter: Payer: Self-pay | Admitting: Oncology

## 2020-12-20 NOTE — Progress Notes (Signed)
Faxed completed insurance information form to [place] for [employer] CDx testing.

## 2020-12-26 DIAGNOSIS — C569 Malignant neoplasm of unspecified ovary: Secondary | ICD-10-CM | POA: Diagnosis not present

## 2020-12-31 ENCOUNTER — Other Ambulatory Visit: Payer: Self-pay | Admitting: Internal Medicine

## 2021-01-01 MED FILL — Dexamethasone Sodium Phosphate Inj 100 MG/10ML: INTRAMUSCULAR | Qty: 1 | Status: AC

## 2021-01-01 MED FILL — Fosaprepitant Dimeglumine For IV Infusion 150 MG (Base Eq): INTRAVENOUS | Qty: 5 | Status: AC

## 2021-01-02 ENCOUNTER — Encounter: Payer: Self-pay | Admitting: Hematology and Oncology

## 2021-01-02 ENCOUNTER — Inpatient Hospital Stay: Payer: PPO | Attending: Gynecologic Oncology | Admitting: Hematology and Oncology

## 2021-01-02 ENCOUNTER — Inpatient Hospital Stay: Payer: PPO

## 2021-01-02 ENCOUNTER — Other Ambulatory Visit: Payer: Self-pay | Admitting: Hematology and Oncology

## 2021-01-02 ENCOUNTER — Telehealth: Payer: Self-pay | Admitting: Pharmacist

## 2021-01-02 ENCOUNTER — Telehealth: Payer: Self-pay

## 2021-01-02 ENCOUNTER — Other Ambulatory Visit: Payer: Self-pay

## 2021-01-02 VITALS — BP 142/64 | HR 91 | Temp 97.3°F | Resp 17 | Ht 64.0 in | Wt 134.6 lb

## 2021-01-02 DIAGNOSIS — C50212 Malignant neoplasm of upper-inner quadrant of left female breast: Secondary | ICD-10-CM

## 2021-01-02 DIAGNOSIS — Z7984 Long term (current) use of oral hypoglycemic drugs: Secondary | ICD-10-CM | POA: Insufficient documentation

## 2021-01-02 DIAGNOSIS — Z7189 Other specified counseling: Secondary | ICD-10-CM | POA: Diagnosis not present

## 2021-01-02 DIAGNOSIS — D61810 Antineoplastic chemotherapy induced pancytopenia: Secondary | ICD-10-CM | POA: Diagnosis not present

## 2021-01-02 DIAGNOSIS — E1142 Type 2 diabetes mellitus with diabetic polyneuropathy: Secondary | ICD-10-CM | POA: Diagnosis not present

## 2021-01-02 DIAGNOSIS — C561 Malignant neoplasm of right ovary: Secondary | ICD-10-CM | POA: Diagnosis present

## 2021-01-02 DIAGNOSIS — C569 Malignant neoplasm of unspecified ovary: Secondary | ICD-10-CM | POA: Insufficient documentation

## 2021-01-02 DIAGNOSIS — Z5111 Encounter for antineoplastic chemotherapy: Secondary | ICD-10-CM | POA: Diagnosis present

## 2021-01-02 DIAGNOSIS — C786 Secondary malignant neoplasm of retroperitoneum and peritoneum: Secondary | ICD-10-CM | POA: Diagnosis present

## 2021-01-02 DIAGNOSIS — N183 Chronic kidney disease, stage 3 unspecified: Secondary | ICD-10-CM | POA: Insufficient documentation

## 2021-01-02 DIAGNOSIS — D630 Anemia in neoplastic disease: Secondary | ICD-10-CM

## 2021-01-02 DIAGNOSIS — E1122 Type 2 diabetes mellitus with diabetic chronic kidney disease: Secondary | ICD-10-CM | POA: Insufficient documentation

## 2021-01-02 DIAGNOSIS — D61818 Other pancytopenia: Secondary | ICD-10-CM

## 2021-01-02 DIAGNOSIS — Z95828 Presence of other vascular implants and grafts: Secondary | ICD-10-CM

## 2021-01-02 DIAGNOSIS — Z170 Estrogen receptor positive status [ER+]: Secondary | ICD-10-CM

## 2021-01-02 LAB — CBC WITH DIFFERENTIAL (CANCER CENTER ONLY)
Abs Immature Granulocytes: 0.15 10*3/uL — ABNORMAL HIGH (ref 0.00–0.07)
Basophils Absolute: 0 10*3/uL (ref 0.0–0.1)
Basophils Relative: 0 %
Eosinophils Absolute: 0 10*3/uL (ref 0.0–0.5)
Eosinophils Relative: 0 %
HCT: 28.6 % — ABNORMAL LOW (ref 36.0–46.0)
Hemoglobin: 9.4 g/dL — ABNORMAL LOW (ref 12.0–15.0)
Immature Granulocytes: 2 %
Lymphocytes Relative: 11 %
Lymphs Abs: 0.8 10*3/uL (ref 0.7–4.0)
MCH: 30.1 pg (ref 26.0–34.0)
MCHC: 32.9 g/dL (ref 30.0–36.0)
MCV: 91.7 fL (ref 80.0–100.0)
Monocytes Absolute: 0.1 10*3/uL (ref 0.1–1.0)
Monocytes Relative: 2 %
Neutro Abs: 6.2 10*3/uL (ref 1.7–7.7)
Neutrophils Relative %: 85 %
Platelet Count: 71 10*3/uL — ABNORMAL LOW (ref 150–400)
RBC: 3.12 MIL/uL — ABNORMAL LOW (ref 3.87–5.11)
RDW: 17.2 % — ABNORMAL HIGH (ref 11.5–15.5)
WBC Count: 7.3 10*3/uL (ref 4.0–10.5)
nRBC: 0 % (ref 0.0–0.2)

## 2021-01-02 LAB — CMP (CANCER CENTER ONLY)
ALT: 43 U/L (ref 0–44)
AST: 38 U/L (ref 15–41)
Albumin: 3.7 g/dL (ref 3.5–5.0)
Alkaline Phosphatase: 153 U/L — ABNORMAL HIGH (ref 38–126)
Anion gap: 9 (ref 5–15)
BUN: 26 mg/dL — ABNORMAL HIGH (ref 8–23)
CO2: 25 mmol/L (ref 22–32)
Calcium: 9.6 mg/dL (ref 8.9–10.3)
Chloride: 105 mmol/L (ref 98–111)
Creatinine: 1.21 mg/dL — ABNORMAL HIGH (ref 0.44–1.00)
GFR, Estimated: 45 mL/min — ABNORMAL LOW (ref >60)
Glucose, Bld: 207 mg/dL — ABNORMAL HIGH (ref 70–99)
Potassium: 3.5 mmol/L (ref 3.5–5.1)
Sodium: 139 mmol/L (ref 135–145)
Total Bilirubin: 0.4 mg/dL (ref 0.3–1.2)
Total Protein: 6.9 g/dL (ref 6.5–8.1)

## 2021-01-02 LAB — SAMPLE TO BLOOD BANK

## 2021-01-02 MED ORDER — SODIUM CHLORIDE 0.9% FLUSH
10.0000 mL | Freq: Once | INTRAVENOUS | Status: AC
  Administered 2021-01-02: 10 mL
  Filled 2021-01-02: qty 10

## 2021-01-02 MED ORDER — OLAPARIB 100 MG PO TABS: 200.0000 mg | ORAL_TABLET | Freq: Two times a day (BID) | ORAL | 11 refills | Status: DC

## 2021-01-02 NOTE — Progress Notes (Signed)
[place] OFFICE PROGRESS NOTE  Patient Care Team: [name], MD as PCP - Kinnie Scales, OD as Referring Physician (Optometry) [name], MD as Consulting Physician (Dermatology) Latanya Maudlin, MD as Consulting Physician (Orthopedic Surgery) Inda Castle, MD (Inactive) as Consulting Physician (Gastroenterology) Jacqulyn Liner, RN as Oncology Nurse Navigator (Oncology) Heath Lark, MD as Consulting Physician (Hematology and Oncology)  ASSESSMENT & PLAN:  Ovarian cancer [facility]) Unfortunately, despite significant dose adjustment, she remained profoundly pancytopenic The risk of further treatment outweighs the benefit I recommend discontinuation of treatment I plan to wait 1 month and repeat CT imaging for objective assessment of response to treatment I am waiting for her molecular test result to be scanned into the computer Apparently, 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 She would qualify for olaparib I will get pharmacist to work on her prescription but will not start olaparib until after blood work and I see her next month  Pancytopenia, acquired [facility]) She has profound pancytopenia due to treatment She does not need transfusion support As above, I recommend discontinuation of chemotherapy  CKD stage 3 due to type 2 diabetes mellitus ([place]) She has significant fluctuation of her serum creatinine She is instructed to drink plenty of fluids  Goals of care, counseling/discussion We discussed briefly the role of adjuvant olaparib in the setting of HRD positive disease She is in agreement to proceed   Orders Placed This Encounter  Procedures  . CT CHEST ABDOMEN PELVIS W CONTRAST    Standing Status:   Future    Standing Expiration Date:   01/02/2022    Order Specific Question:   Preferred imaging location?    Answer:   [facility]    Order Specific Question:   Radiology Contrast Protocol - do NOT remove file path    Answer:    [email or web address]    All questions were answered. The patient knows to call the clinic with any problems, questions or concerns. The total time spent in the appointment was 30 minutes encounter with patients including review of chart and various tests results, discussions about plan of care and coordination of care plan   Heath Lark, MD 01/02/2021 12:09 PM  INTERVAL HISTORY: Please see below for problem oriented charting. She returns with her husband for further follow-up She tolerated reduced dose chemotherapy well last cycle She still have persistent peripheral neuropathy Denies nausea She has some mild constipation The patient denies any recent signs or symptoms of bleeding such as spontaneous epistaxis, hematuria or hematochezia. Denies recent infection, fever or chills  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[place])  02/26/2016 Initial Diagnosis   Left breast biopsy 11:00 position: invasive ductal carcinoma with DCIS, ER 90%, PR 10%, HER-2 negative, Ki-67 30%, grade 2, 2.2 cm palpable lesion T2 N0 stage II a clinical stage   03/18/2016 Surgery   Left lumpectomy: Invasive ductal carcinoma, grade 2, 6.3 cm, with high-grade DCIS, margins negative, 0/4 lymph nodes negative, ER 90%, via 10%, HER-2 negative ratio 0.97, Ki-67 30%, T3 N0 stage IIB   03/25/2016 Procedure   Genetic testing is negative for pathogenic mutations within any of the 20 Genes on the breast/ovarian cancer panel   04/04/2016 Oncotype testing   Oncotype DX recurrence score 37, 25% 10 year distant risk of recurrence   04/17/2016 - 07/31/2016 Chemotherapy   Adjuvant chemotherapy with dose dense Adriamycin and Cytoxan followed by Abraxane weekly 8 ( discontinued due  to neuropathy)   09/01/2016 - 09/26/2016 Radiation Therapy   Adj XRT 1) Left breast: 42.5 Gy in 17 fractions. 2) Left breast boost: 7.5 Gy in 3  fractions.   12/02/2016 -  Anti-estrogen oral therapy   Anastrozole 1 mg daily   Ovarian cancer ([place])  04/03/2020 Imaging   US pelvis Complex cystic and solid mass in LEFT adnexa 12.5 cm diameter question cystic ovarian neoplasm; recommend correlation with serum tumor markers and further evaluation by MR imaging with and without contrast.     04/04/2020 Imaging   US venous Doppler No evidence of deep venous thrombosis in the right lower extremity. Left common femoral vein also patent.   04/15/2020 Imaging   MRI pelvis 1. Large complex solid and cystic mass arising from the right adnexa measuring 10.8 by 11.5 by 11.1 cm. This has an aggressive appearance with extensive enhancing mural soft tissue components. Findings are highly suspicious for malignant ovarian neoplasm. 2. Extensive bilateral retroperitoneal and bilateral iliac adenopathy compatible with metastatic disease. 3. Signs of extensive peritoneal carcinomatosis including ascites, enhancement, thickening and nodularity of the peritoneal reflections, omental caking and bulky peritoneal nodularity. 4. Suspected serosal involvement of the dome of bladder with loss of normal fat plane. Cannot rule out mural invasion by tumor.   04/17/2020 Tumor Marker   Patient's tumor was tested for the following markers: CA-125 Results of the tumor marker test revealed 1762.   04/20/2020 Cancer Staging   Staging form: Ovary, Fallopian Tube, and Primary Peritoneal Carcinoma, AJCC 8th Edition - Clinical: FIGO Stage IIIC (cT3, cN1, cM0) - Signed by Heath Lark, MD on 04/20/2020   04/23/2020 Imaging   1. Redemonstrated dominant mixed solid and cystic mass arising from the vicinity of the right ovary measuring at least 11.5 x 10.7 cm, not significantly changed compared to prior MR, and consistent with primary ovarian malignancy.  2. Numerous bulky retroperitoneal, bilateral iliac, and pelvic sidewall lymph nodes. 3. Moderate volume ascites throughout the  abdomen and pelvis with subtle thickening and nodularity throughout the peritoneum, and extensive bulky nodular metastatic disease of the omentum. 4. Constellation of findings is consistent with advanced nodal and peritoneal metastatic disease. 5. There are prominent subcentimeter epicardial lymph nodes, nonspecific although suspicious for nodal metastatic disease. No definite nodal metastatic disease in the chest. Attention on follow-up. 6. There are multiple small subpleural nodules at the right lung base overlying the diaphragm, measuring up to 7 mm. These are generally nonspecific and less favored to represent pulmonary metastatic disease given distribution. Attention on follow-up. 7. Somewhat coarse contour of the liver, suggestive of cirrhosis, although out without overt morphologic stigmata. 8. Aortic Atherosclerosis (ICD10-I70.0).     04/24/2020 Procedure   Successful placement of a right internal jugular approach power injectable Port-A-Cath. The catheter is ready for immediate use.     05/02/2020 Tumor Marker   Patient's tumor was tested for the following markers: CA-125 Results of the tumor marker test revealed 1921   05/03/2020 -  Chemotherapy   The patient had carboplatin and taxol for chemotherapy treatment.     05/08/2020 Procedure   Successful ultrasound-guided paracentesis yielding 2.6 liters of peritoneal fluid.   05/16/2020 Procedure   Successful ultrasound-guided paracentesis yielding 3.2 liters of peritoneal fluid   05/25/2020 Procedure   Successful ultrasound-guided paracentesis yielding 3 liters of peritoneal fluid.     06/01/2020 Procedure   Successful ultrasound-guided therapeutic paracentesis yielding 1.7 liters of peritoneal fluid.   06/14/2020 Tumor Marker   Patient's tumor was tested  for the following markers: CA-125 Results of the tumor marker test revealed 1309   06/20/2020 Imaging   1. Dominant mixed cystic and solid mass in the central pelvis is stable in the  interval. 2. Clear interval decrease in retroperitoneal and pelvic sidewall lymphadenopathy. The bulky omental disease has also clearly decreased in the interval. 3. Interval decrease in ascites. 4. Stable 8 mm subpleural nodule along the diaphragm. 5. Aortic Atherosclerosis (ICD10-I70.0).   07/10/2020 Tumor Marker   Patient's tumor was tested for the following markers: CA-125 Results of the tumor marker test revealed 220   08/03/2020 Tumor Marker   Patient's tumor was tested for the following markers: CA-125 Results of the tumor marker test revealed 53.3.   09/03/2020 Tumor Marker   Patient's tumor was tested for the following markers: CA-125 Results of the tumor marker test revealed 29.7   09/20/2020 Imaging   1. Substantial improvement. The right ovarian cystic and solid mass is half the volume that it measured on 06/20/2020. Similar reduction in the bulk of the omental caking of tumor. Prior ascites is resolved and the retroperitoneal adenopathy is markedly improved. 2. Other imaging findings of potential clinical significance: Stable pleural-based nodularity along the right hemidiaphragm measuring about 1.1 by 0.7 by 0.3 cm. Stable hypodense lesion of the right kidney upper pole, probably a cyst although the configuration of this lesion makes it difficult to obtain accurate density measurements. Left ovarian cyst, stable. Chondrocalcinosis involving the acetabular labra. Mild lumbar spondylosis and degenerative disc disease. 3. Aortic atherosclerosis.     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 (stage ypT3c), predominantly viable with focal fibrosis and hemosiderin laden macrophages (possible mild treatment effect)  B: Ovary and fallopian tube, right,  salpingo-oophorectomy - High grade serous carcinoma involving right ovary and fallopian tube with surface involvement, size up to 8 cm  - Areas of necrosis and hemosiderin laden macrophages suggestive of treatment effect - 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 - See synoptic report and comment  C: Ovary and fallopian tube, left, salpingo-oophorectomy - High grade serous carcinoma involving the left fallopian tube, size up to 0.5 cm - Ovary with no definite involvement by carcinoma identified - Nodular area of endometriosis and peritoneal inclusion cyst also present - See synoptic report and comment   Procedure:    Bilateral salpingo-oophorectomy    Procedure:    Omentectomy    Specimen Integrity of Right Ovary:    Intact with surface involvement by tumor    Specimen Integrity of Left Ovary:    Capsule intact   TUMOR Tumor Site:    Right fallopian tube: favor as primary site; also involves right ovary and left fallopian tube  Histologic Type:    Serous carcinoma  Histologic Grade:    High grade  Tumor Size:    Greatest Dimension (Centimeters): in fallopian tube: 1.0 cm; in ovary: 8.0 cm Ovarian Surface Involvement:    Present    Laterality:    Right  Fallopian Tube Surface Involvement:    Present    Laterality:    Bilateral  Other Tissue / Organ Involvement:    Right ovary  Other Tissue / Organ Involvement:    Right fallopian tube  Other Tissue / Organ Involvement:    Left fallopian tube  Other Tissue /  Organ Involvement:    Omentum  Largest Extrapelvic Peritoneal Focus:    Macroscopic (greater than 2 cm)  Peritoneal / Ascitic Fluid:    Not submitted / unknown  Pleural Fluid:    Not submitted / unknown  Treatment Effect:    No definite or minimal response identified (chemotherapy response score [CRS] 1)   LYMPH NODES Regional Lymph Nodes:    No lymph nodes submitted or found   PATHOLOGIC STAGE CLASSIFICATION (pTNM, AJCC 8th Edition) TNM  Descriptors:    y (post-treatment)  Primary Tumor (pT):    pT3c  Regional Lymph Nodes (pN):    pNX   FIGO STAGE FIGO Stage:    IIIC   ADDITIONAL FINDINGS Additional Findings:    Serous tubal intraepithelial carcinoma (STIC)  Additional Findings:    Left ovary with no definite carcinoma identified, left-sided nodule of endometriosis and peritoneal inclusion cyst    10/18/2020 - 10/20/2020 Hospital Admission   She was admitted to [facility] for interval debulking surgery   11/19/2020 Tumor Marker   Patient's tumor was tested for the following markers: CA-125. Results of the tumor marker test revealed 38.3   12/12/2020 Tumor Marker   Patient's tumor was tested for the following markers CA-125. Results of the tumor marker test revealed 42.3.     REVIEW OF SYSTEMS:   Constitutional: Denies fevers, chills or abnormal weight loss Eyes: Denies blurriness of vision Ears, nose, mouth, throat, and face: Denies mucositis or sore throat Respiratory: Denies cough, dyspnea or wheezes Cardiovascular: Denies palpitation, chest discomfort or lower extremity swelling Skin: Denies abnormal skin rashes Lymphatics: Denies new lymphadenopathy or easy bruising Behavioral/Psych: Mood is stable, no new changes  All other systems were reviewed with the patient and are negative.  I have reviewed the past medical history, past surgical history, social history and family history with the patient and they are unchanged from previous note.  ALLERGIES:  is allergic to keflex [cephalexin], meloxicam, prednisone, premarin [estrogens conjugated], and trovan [alatrofloxacin].  MEDICATIONS:  Current Outpatient Medications  Medication Sig Dispense Refill  . olaparib (LYNPARZA) 100 MG tablet Take 2 tablets (200 mg total) by mouth 2 (two) times daily. Swallow whole. May take with food to decrease nausea and vomiting. 120 tablet 11  . acetaminophen (TYLENOL) 325 MG tablet Take 325 mg by mouth every 6 (six) hours  as needed for headache.    . allopurinol (ZYLOPRIM) 300 MG tablet Take       1 tablet     Daily       to Prevent Gout 90 tablet 0  . blood glucose meter kit and supplies KIT Dispense based on patient and insurance preference. Use to check blood sugar once daily. Dx: E11.22, N18.30 1 each 12  . Cholecalciferol (VITAMIN D3) 5000 units CAPS Take 5,000 Units by mouth daily.    . cyanocobalamin 1000 MCG tablet Take 1,000 mcg by mouth daily.    Marland Kitchen dexamethasone (DECADRON) 4 MG tablet Take 2 tabs at the night before and 2 tab the morning of chemotherapy, every 3 weeks, by mouth 36 tablet 5  . ezetimibe (ZETIA) 10 MG tablet Take 10 mg by mouth daily.    Marland Kitchen gabapentin (NEURONTIN) 300 MG capsule Take 1 capsule in the morning and afternoon and 2 capsules at night 360 capsule 3  . glimepiride (AMARYL) 1 MG tablet Take 1 tab by mouth in the morning if fasting is running 150+. Please do not take this medication if you are ill or not  eating as this can cause a low blood sugar. 90 tablet 1  . glucose blood (FREESTYLE TEST STRIPS) test strip Check Blood Sugar Daily as directed (Dx: E11.29) 100 strip 3  . hydrochlorothiazide (HYDRODIURIL) 25 MG tablet Take  1 tablet  Daily  for BP and Fluid Retention /Ankle Swelling 90 tablet 0  . Lancets (FREESTYLE) lancets CHECK BLOOD SUGAR DAILY AS DIRECTED 100 each 3  . lidocaine-prilocaine (EMLA) cream Apply to affected area once (Patient taking differently: Apply 1 application topically daily as needed (port access).) 30 g 3  . loratadine (CLARITIN) 10 MG tablet Take 10 mg by mouth daily as needed for allergies.    . Magnesium 250 MG TABS Take 250 mg by mouth every other day.    . metoprolol succinate (TOPROL-XL) 25 MG 24 hr tablet TAKE 1 TABLET BY MOUTH DAILY FOR BLOOD PRESSURE (Patient taking differently: Take 25 mg by mouth daily.) 90 tablet 3  . omeprazole (PRILOSEC) 20 MG capsule Take 1 capsule (20 mg total) by mouth daily. 30 capsule 11  . ondansetron (ZOFRAN) 8 MG tablet  Take 1 tablet (8 mg total) by mouth 2 (two) times daily as needed for refractory nausea / vomiting. Start on day 3 after carboplatin chemo. 30 tablet 1  . oxyCODONE (OXY IR/ROXICODONE) 5 MG immediate release tablet Take 1 tablet by mouth every 6 (six) hours as needed for moderate pain or severe pain.    Marland Kitchen prochlorperazine (COMPAZINE) 10 MG tablet Take 1 tablet (10 mg total) by mouth every 6 (six) hours as needed for nausea or vomiting. 60 tablet 9  . vitamin C (ASCORBIC ACID) 500 MG tablet Take 500 mg by mouth every evening.      No current facility-administered medications for this visit.    PHYSICAL EXAMINATION: ECOG PERFORMANCE STATUS: 1 - Symptomatic but completely ambulatory  Vitals:   01/02/21 0856  BP: (!) 142/64  Pulse: 91  Resp: 17  Temp: (!) 97.3 F (36.3 C)  SpO2: 100%   Filed Weights   01/02/21 0856  Weight: 134 lb 9.6 oz (61.1 kg)    GENERAL:alert, no distress and comfortable NEURO: alert & oriented x 3 with fluent speech, no focal motor/sensory deficits  LABORATORY DATA:  I have reviewed the data as listed    Component Value Date/Time   NA 139 01/02/2021 0849   NA 141 11/06/2016 1102   K 3.5 01/02/2021 0849   K 3.9 11/06/2016 1102   CL 105 01/02/2021 0849   CO2 25 01/02/2021 0849   CO2 27 11/06/2016 1102   GLUCOSE 207 (H) 01/02/2021 0849   GLUCOSE 130 11/06/2016 1102   BUN 26 (H) 01/02/2021 0849   BUN 18.0 11/06/2016 1102   CREATININE 1.21 (H) 01/02/2021 0849   CREATININE 1.17 (H) 11/01/2020 1032   CREATININE 1.3 (H) 11/06/2016 1102   CALCIUM 9.6 01/02/2021 0849   CALCIUM 10.2 11/06/2016 1102   PROT 6.9 01/02/2021 0849   PROT 7.1 11/06/2016 1102   ALBUMIN 3.7 01/02/2021 0849   ALBUMIN 3.7 11/06/2016 1102   AST 38 01/02/2021 0849   AST 52 (H) 11/06/2016 1102   ALT 43 01/02/2021 0849   ALT 58 (H) 11/06/2016 1102   ALKPHOS 153 (H) 01/02/2021 0849   ALKPHOS 159 (H) 11/06/2016 1102   BILITOT 0.4 01/02/2021 0849   BILITOT 0.53 11/06/2016 1102    GFRNONAA 45 (L) 01/02/2021 0849   GFRNONAA 43 (L) 11/01/2020 1032   GFRAA 50 (L) 11/01/2020 1032    No results  found for: SPEP, UPEP  Lab Results  Component Value Date   WBC 7.3 01/02/2021   NEUTROABS 6.2 01/02/2021   HGB 9.4 (L) 01/02/2021   HCT 28.6 (L) 01/02/2021   MCV 91.7 01/02/2021   PLT 71 (L) 01/02/2021      Chemistry      Component Value Date/Time   NA 139 01/02/2021 0849   NA 141 11/06/2016 1102   K 3.5 01/02/2021 0849   K 3.9 11/06/2016 1102   CL 105 01/02/2021 0849   CO2 25 01/02/2021 0849   CO2 27 11/06/2016 1102   BUN 26 (H) 01/02/2021 0849   BUN 18.0 11/06/2016 1102   CREATININE 1.21 (H) 01/02/2021 0849   CREATININE 1.17 (H) 11/01/2020 1032   CREATININE 1.3 (H) 11/06/2016 1102      Component Value Date/Time   CALCIUM 9.6 01/02/2021 0849   CALCIUM 10.2 11/06/2016 1102   ALKPHOS 153 (H) 01/02/2021 0849   ALKPHOS 159 (H) 11/06/2016 1102   AST 38 01/02/2021 0849   AST 52 (H) 11/06/2016 1102   ALT 43 01/02/2021 0849   ALT 58 (H) 11/06/2016 1102   BILITOT 0.4 01/02/2021 0849   BILITOT 0.53 11/06/2016 1102

## 2021-01-02 NOTE — Assessment & Plan Note (Signed)
She has profound pancytopenia due to treatment She does not need transfusion support As above, I recommend discontinuation of chemotherapy

## 2021-01-02 NOTE — Assessment & Plan Note (Signed)
We discussed briefly the role of adjuvant olaparib in the setting of HRD positive disease She is in agreement to proceed

## 2021-01-02 NOTE — Assessment & Plan Note (Signed)
Unfortunately, despite significant dose adjustment, she remained profoundly pancytopenic The risk of further treatment outweighs the benefit I recommend discontinuation of treatment I plan to wait 1 month and repeat CT imaging for objective assessment of response to treatment I am waiting for her molecular test result to be scanned into the computer Apparently, 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 She would qualify for olaparib I will get pharmacist to work on her prescription but will not start olaparib until after blood work and I see her next month

## 2021-01-02 NOTE — Assessment & Plan Note (Signed)
She has significant fluctuation of her serum creatinine She is instructed to drink plenty of fluids

## 2021-01-02 NOTE — Telephone Encounter (Signed)
Oral Oncology Patient Advocate Encounter  Received notification from [employer] that prior authorization for Kayla Baker is required.  PA submitted on CoverMyMeds Key [id number] Status is pending  Oral Oncology Clinic will continue to follow.  [name] [facility] [place] Phone [phone] Fax [phone] 01/02/2021 12:38 PM

## 2021-01-03 LAB — CA 125: Cancer Antigen (CA) 125: 37.3 U/mL (ref 0.0–38.1)

## 2021-01-03 NOTE — Telephone Encounter (Signed)
Oral Oncology Patient Advocate Encounter  Prior Authorization for Kayla Baker has been approved.    PA# [id number] Effective dates: 01/02/21 through 01/02/22  Patients co-pay is $3122  Oral Oncology Clinic will continue to follow.   [place] [facility] [place] Phone [phone] Fax [phone] 01/03/2021 7:53 AM

## 2021-01-04 ENCOUNTER — Telehealth: Payer: Self-pay

## 2021-01-04 NOTE — Telephone Encounter (Signed)
Oral Oncology Patient Advocate Encounter  Met patient in [place] to complete [place] in an effort to reduce the patient's out of pocket expense for Lynparza to $0.    Application completed and faxed to [phone].   [place] patient [place] phone number for follow up is [phone].   This encounter will be updated until final determination.  [place] Patient [place] Phone [phone] Fax [phone] 01/04/2021 2:19 PM

## 2021-01-14 NOTE — Telephone Encounter (Signed)
Oral Oncology Pharmacist Encounter  Received new prescription for Lynparza (olaparib) for the treatment of advanced ovarian cancer, HRD positive, planned duration until disease progression or unacceptable drug toxicity.  Prescription dose and frequency assessed for appropriateness.  CBC w/ Diff and CMP from 01/02/21 assessed, noted Hgb 9.4 g/dL, pltc 71 K/uL, Scr 1.21 mg/dL (CrCL ~45 ml/min) - dose appropriate based on current renal function. Patient will have repeat labs prior to initiation with Lynparza.   Current medication list in Epic reviewed, no relevant/significant DDIs with Lynparza identified.  Evaluated chart and no patient barriers to medication adherence noted.   Prescription has been e-scribed to the [facility] for benefits analysis and approval. Due to high co-pay currently awaiting manufacture assistance approval for Lynparza.   Oral Oncology Clinic will continue to follow for insurance authorization, copayment issues, initial counseling and start date.   [place], PharmD, BCPS Hematology/Oncology Clinical Pharmacist [place] [facility] [phone] 01/14/2021 9:58 AM 

## 2021-01-15 ENCOUNTER — Encounter: Payer: Self-pay | Admitting: Internal Medicine

## 2021-01-15 NOTE — Telephone Encounter (Signed)
Patient is approved for Lynparza at cost from [facility] and Me 01/15/21-11/30/21  [place] and Me uses [place] [facility] [place] Phone [phone] Fax [phone] 01/15/2021 10:04 AM

## 2021-01-16 NOTE — Telephone Encounter (Signed)
Oral Chemotherapy Pharmacist Encounter   Spoke with patient today to follow up regarding patient's oral chemotherapy medication: Lynparza (olaparib)  Notified patient that she has been approved for [employer] assistance through [place]. Patient provided with phone number to set up medication shipment [phone]).  Patient knows she will not be starting Lynparza until after seeing Dr. [name] at the beginning of March.   Patient knows to call the office with questions or concerns.  Leron Croak, PharmD, BCPS Hematology/Oncology Clinical Pharmacist [facility] [phone] 01/16/2021 9:59 AM

## 2021-01-19 ENCOUNTER — Other Ambulatory Visit: Payer: Self-pay | Admitting: Internal Medicine

## 2021-01-19 DIAGNOSIS — M1000 Idiopathic gout, unspecified site: Secondary | ICD-10-CM

## 2021-01-24 NOTE — Telephone Encounter (Signed)
Thanks for the update

## 2021-01-24 NOTE — Telephone Encounter (Signed)
Oral Chemotherapy Pharmacist Encounter  I spoke with patient today for overview of: Kayla Baker (olaparib) for the treatment of advanced ovarian cancer, HRD positive, planned duration 2 years or until disease progression or unacceptable toxicity.   Counseled patient on administration, dosing, side effects, monitoring, drug-food interactions, safe handling, storage, and disposal.  Patient will take Kayla Baker 156m tablets, 2 tablets (2026m by mouth 2 times daily without regard to food.  Patient counseled that administering with food may increase tolerability.  Patient knows to avoid grapefruit or grapefruit juice while on therapy with Kayla Baker.  [name] date: after MD visit on 01/30/21   Adverse effects include but are not limited to: nausea, vomiting, diarrhea, taste changes, mouth sores, fatigue, constipation, decreased blood counts, and joint pain. Patient informed that pneumonitis (including some fatalities) has occurred rarely. Myelodysplastic syndrome/acute myeloid leukemia (MDS/AML) have been reported (rarely) in clinical trials.  Patient has anti-emetic on hand and knows to take it if nausea develops.   Patient will obtain anti diarrheal and alert the office of 4 or more loose stools above baseline.  Reviewed with patient importance of keeping a medication schedule and plan for any missed doses. No barriers to medication adherence identified.  Medication reconciliation performed and medication/allergy list updated.  Insurance authorization for LyLonie Peakas been obtained. Test claim at the pharmacy revealed copayment $3122 for 1st fill of Kayla Baker. This is unaffordable to patient. Patient enrolled in manufacturer assistance to receive Kayla Baker at no cost through [employer]. Medication has been delivered to patient's home.   All questions answered.  Ms. CoRussumoiced understanding and appreciation.   Medication education handout placed in mail for patient. Patient knows to call the  office with questions or concerns. [facility] phone number provided to patient.   ReLeron CroakPharmD, BCPS Hematology/Oncology Clinical Pharmacist [facility]([phone]) [phone]/24/2022 2:58 PM

## 2021-01-28 ENCOUNTER — Other Ambulatory Visit: Payer: Self-pay | Admitting: Hematology and Oncology

## 2021-01-28 DIAGNOSIS — Z853 Personal history of malignant neoplasm of breast: Secondary | ICD-10-CM

## 2021-01-29 ENCOUNTER — Ambulatory Visit (HOSPITAL_COMMUNITY)
Admission: RE | Admit: 2021-01-29 | Discharge: 2021-01-29 | Disposition: A | Discharge status: Home / Self Care | Payer: PPO | Source: Ambulatory Visit | Attending: Hematology and Oncology | Admitting: Hematology and Oncology

## 2021-01-29 ENCOUNTER — Encounter (HOSPITAL_COMMUNITY): Payer: Self-pay

## 2021-01-29 ENCOUNTER — Other Ambulatory Visit: Payer: Self-pay | Admitting: Hematology and Oncology

## 2021-01-29 ENCOUNTER — Inpatient Hospital Stay: Payer: PPO | Attending: Gynecologic Oncology

## 2021-01-29 ENCOUNTER — Other Ambulatory Visit: Payer: Self-pay

## 2021-01-29 ENCOUNTER — Inpatient Hospital Stay: Payer: PPO

## 2021-01-29 DIAGNOSIS — I700 Atherosclerosis of aorta: Secondary | ICD-10-CM | POA: Insufficient documentation

## 2021-01-29 DIAGNOSIS — C569 Malignant neoplasm of unspecified ovary: Secondary | ICD-10-CM | POA: Diagnosis not present

## 2021-01-29 DIAGNOSIS — C786 Secondary malignant neoplasm of retroperitoneum and peritoneum: Secondary | ICD-10-CM | POA: Diagnosis not present

## 2021-01-29 DIAGNOSIS — C561 Malignant neoplasm of right ovary: Secondary | ICD-10-CM | POA: Diagnosis not present

## 2021-01-29 DIAGNOSIS — Z170 Estrogen receptor positive status [ER+]: Secondary | ICD-10-CM | POA: Insufficient documentation

## 2021-01-29 DIAGNOSIS — E1140 Type 2 diabetes mellitus with diabetic neuropathy, unspecified: Secondary | ICD-10-CM | POA: Insufficient documentation

## 2021-01-29 DIAGNOSIS — N183 Chronic kidney disease, stage 3 unspecified: Secondary | ICD-10-CM | POA: Insufficient documentation

## 2021-01-29 DIAGNOSIS — Z95828 Presence of other vascular implants and grafts: Secondary | ICD-10-CM

## 2021-01-29 DIAGNOSIS — Z90721 Acquired absence of ovaries, unilateral: Secondary | ICD-10-CM | POA: Diagnosis not present

## 2021-01-29 DIAGNOSIS — Z7984 Long term (current) use of oral hypoglycemic drugs: Secondary | ICD-10-CM | POA: Diagnosis not present

## 2021-01-29 DIAGNOSIS — D61818 Other pancytopenia: Secondary | ICD-10-CM | POA: Insufficient documentation

## 2021-01-29 DIAGNOSIS — C50212 Malignant neoplasm of upper-inner quadrant of left female breast: Secondary | ICD-10-CM | POA: Diagnosis not present

## 2021-01-29 DIAGNOSIS — R188 Other ascites: Secondary | ICD-10-CM | POA: Diagnosis not present

## 2021-01-29 DIAGNOSIS — Z79811 Long term (current) use of aromatase inhibitors: Secondary | ICD-10-CM | POA: Insufficient documentation

## 2021-01-29 DIAGNOSIS — R911 Solitary pulmonary nodule: Secondary | ICD-10-CM | POA: Diagnosis not present

## 2021-01-29 DIAGNOSIS — D630 Anemia in neoplastic disease: Secondary | ICD-10-CM

## 2021-01-29 DIAGNOSIS — E1122 Type 2 diabetes mellitus with diabetic chronic kidney disease: Secondary | ICD-10-CM | POA: Insufficient documentation

## 2021-01-29 DIAGNOSIS — Z79899 Other long term (current) drug therapy: Secondary | ICD-10-CM | POA: Insufficient documentation

## 2021-01-29 DIAGNOSIS — Z90710 Acquired absence of both cervix and uterus: Secondary | ICD-10-CM | POA: Diagnosis not present

## 2021-01-29 LAB — CBC WITH DIFFERENTIAL (CANCER CENTER ONLY)
Abs Immature Granulocytes: 0.03 10*3/uL (ref 0.00–0.07)
Basophils Absolute: 0 10*3/uL (ref 0.0–0.1)
Basophils Relative: 1 %
Eosinophils Absolute: 0.1 10*3/uL (ref 0.0–0.5)
Eosinophils Relative: 2 %
HCT: 31.9 % — ABNORMAL LOW (ref 36.0–46.0)
Hemoglobin: 10.1 g/dL — ABNORMAL LOW (ref 12.0–15.0)
Immature Granulocytes: 1 %
Lymphocytes Relative: 35 %
Lymphs Abs: 1.3 10*3/uL (ref 0.7–4.0)
MCH: 30.1 pg (ref 26.0–34.0)
MCHC: 31.7 g/dL (ref 30.0–36.0)
MCV: 95.2 fL (ref 80.0–100.0)
Monocytes Absolute: 0.4 10*3/uL (ref 0.1–1.0)
Monocytes Relative: 11 %
Neutro Abs: 1.9 10*3/uL (ref 1.7–7.7)
Neutrophils Relative %: 50 %
Platelet Count: 130 10*3/uL — ABNORMAL LOW (ref 150–400)
RBC: 3.35 MIL/uL — ABNORMAL LOW (ref 3.87–5.11)
RDW: 17.6 % — ABNORMAL HIGH (ref 11.5–15.5)
WBC Count: 3.8 10*3/uL — ABNORMAL LOW (ref 4.0–10.5)
nRBC: 0 % (ref 0.0–0.2)

## 2021-01-29 LAB — CMP (CANCER CENTER ONLY)
ALT: 20 U/L (ref 0–44)
AST: 26 U/L (ref 15–41)
Albumin: 3.7 g/dL (ref 3.5–5.0)
Alkaline Phosphatase: 120 U/L (ref 38–126)
Anion gap: 11 (ref 5–15)
BUN: 17 mg/dL (ref 8–23)
CO2: 26 mmol/L (ref 22–32)
Calcium: 9.9 mg/dL (ref 8.9–10.3)
Chloride: 106 mmol/L (ref 98–111)
Creatinine: 1.23 mg/dL — ABNORMAL HIGH (ref 0.44–1.00)
GFR, Estimated: 44 mL/min — ABNORMAL LOW (ref >60)
Glucose, Bld: 131 mg/dL — ABNORMAL HIGH (ref 70–99)
Potassium: 3.7 mmol/L (ref 3.5–5.1)
Sodium: 143 mmol/L (ref 135–145)
Total Bilirubin: 0.4 mg/dL (ref 0.3–1.2)
Total Protein: 6.9 g/dL (ref 6.5–8.1)

## 2021-01-29 LAB — SAMPLE TO BLOOD BANK

## 2021-01-29 MED ORDER — IOHEXOL 300 MG/ML  SOLN
100.0000 mL | Freq: Once | INTRAMUSCULAR | Status: AC | PRN
  Administered 2021-01-29: 80 mL via INTRAVENOUS

## 2021-01-29 MED ORDER — HEPARIN SOD (PORK) LOCK FLUSH 100 UNIT/ML IV SOLN
500.0000 [IU] | Freq: Once | INTRAVENOUS | Status: AC
  Administered 2021-01-29: 500 [IU] via INTRAVENOUS

## 2021-01-29 MED ORDER — HEPARIN SOD (PORK) LOCK FLUSH 100 UNIT/ML IV SOLN
INTRAVENOUS | Status: AC
  Filled 2021-01-29: qty 5

## 2021-01-29 MED ORDER — SODIUM CHLORIDE 0.9% FLUSH
10.0000 mL | Freq: Once | INTRAVENOUS | Status: AC
Start: 2021-01-29 — End: 2021-01-29
  Administered 2021-01-29: 10 mL
  Filled 2021-01-29: qty 10

## 2021-01-30 ENCOUNTER — Inpatient Hospital Stay: Payer: PPO | Admitting: Hematology and Oncology

## 2021-01-30 ENCOUNTER — Encounter: Payer: Self-pay | Admitting: Hematology and Oncology

## 2021-01-30 ENCOUNTER — Other Ambulatory Visit: Payer: Self-pay

## 2021-01-30 DIAGNOSIS — R600 Localized edema: Secondary | ICD-10-CM

## 2021-01-30 DIAGNOSIS — D61818 Other pancytopenia: Secondary | ICD-10-CM | POA: Diagnosis not present

## 2021-01-30 DIAGNOSIS — E1122 Type 2 diabetes mellitus with diabetic chronic kidney disease: Secondary | ICD-10-CM | POA: Diagnosis not present

## 2021-01-30 DIAGNOSIS — N183 Chronic kidney disease, stage 3 unspecified: Secondary | ICD-10-CM | POA: Diagnosis not present

## 2021-01-30 DIAGNOSIS — C569 Malignant neoplasm of unspecified ovary: Secondary | ICD-10-CM

## 2021-01-30 DIAGNOSIS — C561 Malignant neoplasm of right ovary: Secondary | ICD-10-CM | POA: Diagnosis not present

## 2021-01-30 LAB — CA 125: Cancer Antigen (CA) 125: 44.3 U/mL — ABNORMAL HIGH (ref 0.0–38.1)

## 2021-01-30 NOTE — Progress Notes (Signed)
[place] OFFICE PROGRESS NOTE  Patient Care Team: [name], MD as PCP - Onalee Hua, Wille Glaser, OD as Referring Physician (Optometry) [name], MD as Consulting Physician (Dermatology) Latanya Maudlin, MD as Consulting Physician (Orthopedic Surgery) Inda Castle, MD (Inactive) as Consulting Physician (Gastroenterology) Jacqulyn Liner, RN as Oncology Nurse Navigator (Oncology) Heath Lark, MD as Consulting Physician (Hematology and Oncology)  ASSESSMENT & PLAN:  Ovarian cancer [facility]) I have reviewed multiple imaging studies with the patient and her husband The pelvic fluid collection/ascites may or may not be related to cancer recurrence Postoperative changes could give similar findings The peritoneal nodules are nonspecific and overall stable As previously discussed, we will start her on olaparib maintenance treatment We discussed the risk and benefits and she is interested to start tomorrow She is improved and has received her supply of medications Due to her history of pancytopenia, she will be starting on reduced dose olaparib For the next 3 weeks, she will come back weekly for lab monitoring and virtual visit on the same day to review toxicity and potentially dose adjustment  Pancytopenia, acquired ([place]) Her pancytopenia is stable/improved She is not symptomatic We will monitor closely I anticipate she might have worsening pancytopenia while on olaparib and she will return here weekly to get her blood checked over the next few weeks  CKD stage 3 due to type 2 diabetes mellitus ([place]) She has stable chronic kidney disease stage III Observe closely for now  Bilateral leg edema She complained of bilateral lower extremity edema Her serum albumin is good Her CT imaging is stable It could be related to fluid retention from chronic kidney disease Monitor closely for   No orders of the defined types were placed in this encounter.   All questions were  answered. The patient knows to call the clinic with any problems, questions or concerns. The total time spent in the appointment was 30 minutes encounter with patients including review of chart and various tests results, discussions about plan of care and coordination of care plan   Heath Lark, MD 01/30/2021 2:11 PM  INTERVAL HISTORY: Please see below for problem oriented charting. She returns with her husband for further follow-up She complained of mild bilateral lower extremity edema Her appetite is fair Overall, her energy has improved since discontinuation of treatment I verify she has received her supply of olaparib She has printed out copies of CT imaging results but did not know what the description means SUMMARY OF ONCOLOGIC HISTORY: Oncology History Overview Note  HRD positive   Breast cancer of upper-inner quadrant of left female breast ([place])  02/26/2016 Initial Diagnosis   Left breast biopsy 11:00 position: invasive ductal carcinoma with DCIS, ER 90%, PR 10%, HER-2 negative, Ki-67 30%, grade 2, 2.2 cm palpable lesion T2 N0 stage II a clinical stage   03/18/2016 Surgery   Left lumpectomy: Invasive ductal carcinoma, grade 2, 6.3 cm, with high-grade DCIS, margins negative, 0/4 lymph nodes negative, ER 90%, via 10%, HER-2 negative ratio 0.97, Ki-67 30%, T3 N0 stage IIB   03/25/2016 Procedure   Genetic testing is negative for pathogenic mutations within any of the 20 Genes on the breast/ovarian cancer panel   04/04/2016 Oncotype testing   Oncotype DX recurrence score 37, 25% 10 year distant risk of recurrence   04/17/2016 - 07/31/2016 Chemotherapy   Adjuvant chemotherapy with dose dense Adriamycin and Cytoxan followed by Abraxane weekly 8 ( discontinued due to neuropathy)   09/01/2016 - 09/26/2016 Radiation Therapy  Adj XRT 1) Left breast: 42.5 Gy in 17 fractions. 2) Left breast boost: 7.5 Gy in 3 fractions.   12/02/2016 -  Anti-estrogen oral therapy   Anastrozole 1 mg daily    Ovarian cancer ([place])  04/03/2020 Imaging   US pelvis Complex cystic and solid mass in LEFT adnexa 12.5 cm diameter question cystic ovarian neoplasm; recommend correlation with serum tumor markers and further evaluation by MR imaging with and without contrast.     04/04/2020 Imaging   US venous Doppler No evidence of deep venous thrombosis in the right lower extremity. Left common femoral vein also patent.   04/15/2020 Imaging   MRI pelvis 1. Large complex solid and cystic mass arising from the right adnexa measuring 10.8 by 11.5 by 11.1 cm. This has an aggressive appearance with extensive enhancing mural soft tissue components. Findings are highly suspicious for malignant ovarian neoplasm. 2. Extensive bilateral retroperitoneal and bilateral iliac adenopathy compatible with metastatic disease. 3. Signs of extensive peritoneal carcinomatosis including ascites, enhancement, thickening and nodularity of the peritoneal reflections, omental caking and bulky peritoneal nodularity. 4. Suspected serosal involvement of the dome of bladder with loss of normal fat plane. Cannot rule out mural invasion by tumor.   04/17/2020 Tumor Marker   Patient's tumor was tested for the following markers: CA-125 Results of the tumor marker test revealed 1762.   04/20/2020 Cancer Staging   Staging form: Ovary, Fallopian Tube, and Primary Peritoneal Carcinoma, AJCC 8th Edition - Clinical: FIGO Stage IIIC (cT3, cN1, cM0) - Signed by Heath Lark, MD on 04/20/2020   04/23/2020 Imaging   1. Redemonstrated dominant mixed solid and cystic mass arising from the vicinity of the right ovary measuring at least 11.5 x 10.7 cm, not significantly changed compared to prior MR, and consistent with primary ovarian malignancy.  2. Numerous bulky retroperitoneal, bilateral iliac, and pelvic sidewall lymph nodes. 3. Moderate volume ascites throughout the abdomen and pelvis with subtle thickening and nodularity throughout the peritoneum,  and extensive bulky nodular metastatic disease of the omentum. 4. Constellation of findings is consistent with advanced nodal and peritoneal metastatic disease. 5. There are prominent subcentimeter epicardial lymph nodes, nonspecific although suspicious for nodal metastatic disease. No definite nodal metastatic disease in the chest. Attention on follow-up. 6. There are multiple small subpleural nodules at the right lung base overlying the diaphragm, measuring up to 7 mm. These are generally nonspecific and less favored to represent pulmonary metastatic disease given distribution. Attention on follow-up. 7. Somewhat coarse contour of the liver, suggestive of cirrhosis, although out without overt morphologic stigmata. 8. Aortic Atherosclerosis (ICD10-I70.0).     04/24/2020 Procedure   Successful placement of a right internal jugular approach power injectable Port-A-Cath. The catheter is ready for immediate use.     05/02/2020 Tumor Marker   Patient's tumor was tested for the following markers: CA-125 Results of the tumor marker test revealed 1921   05/03/2020 - 12/12/2020 Chemotherapy   The patient had carboplatin and taxol for chemotherapy treatment.     05/08/2020 Procedure   Successful ultrasound-guided paracentesis yielding 2.6 liters of peritoneal fluid.   05/16/2020 Procedure   Successful ultrasound-guided paracentesis yielding 3.2 liters of peritoneal fluid   05/25/2020 Procedure   Successful ultrasound-guided paracentesis yielding 3 liters of peritoneal fluid.     06/01/2020 Procedure   Successful ultrasound-guided therapeutic paracentesis yielding 1.7 liters of peritoneal fluid.   06/14/2020 Tumor Marker   Patient's tumor was tested for the following markers: CA-125 Results of the tumor marker test  revealed 1309   06/20/2020 Imaging   1. Dominant mixed cystic and solid mass in the central pelvis is stable in the interval. 2. Clear interval decrease in retroperitoneal and pelvic  sidewall lymphadenopathy. The bulky omental disease has also clearly decreased in the interval. 3. Interval decrease in ascites. 4. Stable 8 mm subpleural nodule along the diaphragm. 5. Aortic Atherosclerosis (ICD10-I70.0).   07/10/2020 Tumor Marker   Patient's tumor was tested for the following markers: CA-125 Results of the tumor marker test revealed 220   08/03/2020 Tumor Marker   Patient's tumor was tested for the following markers: CA-125 Results of the tumor marker test revealed 53.3.   09/03/2020 Tumor Marker   Patient's tumor was tested for the following markers: CA-125 Results of the tumor marker test revealed 29.7   09/20/2020 Imaging   1. Substantial improvement. The right ovarian cystic and solid mass is half the volume that it measured on 06/20/2020. Similar reduction in the bulk of the omental caking of tumor. Prior ascites is resolved and the retroperitoneal adenopathy is markedly improved. 2. Other imaging findings of potential clinical significance: Stable pleural-based nodularity along the right hemidiaphragm measuring about 1.1 by 0.7 by 0.3 cm. Stable hypodense lesion of the right kidney upper pole, probably a cyst although the configuration of this lesion makes it difficult to obtain accurate density measurements. Left ovarian cyst, stable. Chondrocalcinosis involving the acetabular labra. Mild lumbar spondylosis and degenerative disc disease. 3. Aortic atherosclerosis.     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 (stage ypT3c), predominantly viable with focal fibrosis and hemosiderin laden macrophages (possible mild treatment effect)  B: Ovary and fallopian tube, right, salpingo-oophorectomy - High grade serous carcinoma involving right  ovary and fallopian tube with surface involvement, size up to 8 cm  - Areas of necrosis and hemosiderin laden macrophages suggestive of treatment effect - 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 - See synoptic report and comment  C: Ovary and fallopian tube, left, salpingo-oophorectomy - High grade serous carcinoma involving the left fallopian tube, size up to 0.5 cm - Ovary with no definite involvement by carcinoma identified - Nodular area of endometriosis and peritoneal inclusion cyst also present - See synoptic report and comment   Procedure:    Bilateral salpingo-oophorectomy    Procedure:    Omentectomy    Specimen Integrity of Right Ovary:    Intact with surface involvement by tumor    Specimen Integrity of Left Ovary:    Capsule intact   TUMOR Tumor Site:    Right fallopian tube: favor as primary site; also involves right ovary and left fallopian tube  Histologic Type:    Serous carcinoma  Histologic Grade:    High grade  Tumor Size:    Greatest Dimension (Centimeters): in fallopian tube: 1.0 cm; in ovary: 8.0 cm Ovarian Surface Involvement:    Present    Laterality:    Right  Fallopian Tube Surface Involvement:    Present    Laterality:    Bilateral  Other Tissue / Organ Involvement:    Right ovary  Other Tissue / Organ Involvement:    Right fallopian tube  Other Tissue / Organ Involvement:    Left fallopian tube  Other Tissue / Organ Involvement:    Omentum  Largest Extrapelvic Peritoneal Focus:  Macroscopic (greater than 2 cm)  Peritoneal / Ascitic Fluid:    Not submitted / unknown  Pleural Fluid:    Not submitted / unknown  Treatment Effect:    No definite or minimal response identified (chemotherapy response score [CRS] 1)   LYMPH NODES Regional Lymph Nodes:    No lymph nodes submitted or found   PATHOLOGIC STAGE CLASSIFICATION (pTNM, AJCC 8th Edition) TNM Descriptors:    y (post-treatment)  Primary Tumor (pT):    pT3c   Regional Lymph Nodes (pN):    pNX   FIGO STAGE FIGO Stage:    IIIC   ADDITIONAL FINDINGS Additional Findings:    Serous tubal intraepithelial carcinoma (STIC)  Additional Findings:    Left ovary with no definite carcinoma identified, left-sided nodule of endometriosis and peritoneal inclusion cyst    10/18/2020 - 10/20/2020 Hospital Admission   She was admitted to [facility] for interval debulking surgery   11/19/2020 Tumor Marker   Patient's tumor was tested for the following markers: CA-125. Results of the tumor marker test revealed 38.3   12/12/2020 Tumor Marker   Patient's tumor was tested for the following markers CA-125. Results of the tumor marker test revealed 42.3.   01/02/2021 Tumor Marker   Patient's tumor was tested for the following markers: CA-125. Results of the tumor marker test revealed 37.3   01/29/2021 Tumor Marker   Patient's tumor was tested for the following markers: CA-125 Results of the tumor marker test revealed 44.3   01/29/2021 Imaging   1. Interval increase in loculated appearing ascites in the low central abdomen and pelvis. 2. There is extensive peritoneal nodularity surrounding this fluid, the nodularity itself not appreciably changed in appearance compared to prior examination. 3. Additional peritoneal nodularity and/or mesenteric lymph nodes are unchanged. 4. Interval decrease in size of a low-attenuation nodule overlying the right hemidiaphragm, now measuring 1.2 cm, previously 1.8 cm when measured similarly. Findings are consistent with treatment response of a metastatic nodule. No other evidence of intrathoracic metastatic disease. 5. Status post hysterectomy, oophorectomy, and omentectomy.   01/31/2021 -  Chemotherapy   She is started on Olaparib         REVIEW OF SYSTEMS:   Constitutional: Denies fevers, chills or abnormal weight loss Eyes: Denies blurriness of vision Ears, nose, mouth, throat, and face: Denies mucositis or sore  throat Respiratory: Denies cough, dyspnea or wheezes Cardiovascular: Denies palpitation, chest discomfort Gastrointestinal:  Denies nausea, heartburn or change in bowel habits Skin: Denies abnormal skin rashes Lymphatics: Denies new lymphadenopathy or easy bruising Neurological:Denies numbness, tingling or new weaknesses Behavioral/Psych: Mood is stable, no new changes  All other systems were reviewed with the patient and are negative.  I have reviewed the past medical history, past surgical history, social history and family history with the patient and they are unchanged from previous note.  ALLERGIES:  is allergic to keflex [cephalexin], meloxicam, prednisone, premarin [estrogens conjugated], and trovan [alatrofloxacin].  MEDICATIONS:  Current Outpatient Medications  Medication Sig Dispense Refill  . acetaminophen (TYLENOL) 325 MG tablet Take 325 mg by mouth every 6 (six) hours as needed for headache.    . allopurinol (ZYLOPRIM) 300 MG tablet TAKE 1 TABLET BY MOUTH DAILY FOR GOUT PREVENTION 90 tablet 1  . blood glucose meter kit and supplies KIT Dispense based on patient and insurance preference. Use to check blood sugar once daily. Dx: E11.22, N18.30 1 each 12  . Cholecalciferol (VITAMIN D3) 5000 units CAPS Take 5,000 Units by mouth daily.    Marland Kitchen  cyanocobalamin 1000 MCG tablet Take 1,000 mcg by mouth daily.    Marland Kitchen ezetimibe (ZETIA) 10 MG tablet Take 10 mg by mouth daily.    Marland Kitchen gabapentin (NEURONTIN) 300 MG capsule Take 1 capsule in the morning and afternoon and 2 capsules at night 360 capsule 3  . glimepiride (AMARYL) 1 MG tablet Take 1 tab by mouth in the morning if fasting is running 150+. Please do not take this medication if you are ill or not eating as this can cause a low blood sugar. 90 tablet 1  . glucose blood (FREESTYLE TEST STRIPS) test strip Check Blood Sugar Daily as directed (Dx: E11.29) 100 strip 3  . hydrochlorothiazide (HYDRODIURIL) 25 MG tablet Take  1 tablet  Daily  for BP  and Fluid Retention /Ankle Swelling 90 tablet 0  . Lancets (FREESTYLE) lancets CHECK BLOOD SUGAR DAILY AS DIRECTED 100 each 3  . lidocaine-prilocaine (EMLA) cream Apply to affected area once (Patient taking differently: Apply 1 application topically daily as needed (port access).) 30 g 3  . loratadine (CLARITIN) 10 MG tablet Take 10 mg by mouth daily as needed for allergies.    . Magnesium 250 MG TABS Take 250 mg by mouth every other day.    . metoprolol succinate (TOPROL-XL) 25 MG 24 hr tablet TAKE 1 TABLET BY MOUTH DAILY FOR BLOOD PRESSURE (Patient taking differently: Take 25 mg by mouth daily.) 90 tablet 3  . olaparib (LYNPARZA) 100 MG tablet Take 2 tablets (200 mg total) by mouth 2 (two) times daily. Swallow whole. May take with food to decrease nausea and vomiting. 120 tablet 11  . omeprazole (PRILOSEC) 20 MG capsule Take 1 capsule (20 mg total) by mouth daily. 30 capsule 11  . ondansetron (ZOFRAN) 8 MG tablet Take 1 tablet (8 mg total) by mouth 2 (two) times daily as needed for refractory nausea / vomiting. Start on day 3 after carboplatin chemo. 30 tablet 1  . oxyCODONE (OXY IR/ROXICODONE) 5 MG immediate release tablet Take 1 tablet by mouth every 6 (six) hours as needed for moderate pain or severe pain.    Marland Kitchen prochlorperazine (COMPAZINE) 10 MG tablet Take 1 tablet (10 mg total) by mouth every 6 (six) hours as needed for nausea or vomiting. 60 tablet 9  . vitamin C (ASCORBIC ACID) 500 MG tablet Take 500 mg by mouth every evening.      No current facility-administered medications for this visit.    PHYSICAL EXAMINATION: ECOG PERFORMANCE STATUS: 2 - Symptomatic, <50% confined to bed  Vitals:   01/30/21 1003  BP: (!) 141/70  Pulse: 79  Resp: 17  Temp: 97.8 F (36.6 C)  SpO2: 99%   Filed Weights   01/30/21 1003  Weight: 142 lb (64.4 kg)    GENERAL:alert, no distress and comfortable SKIN: skin color, texture, turgor are normal, no rashes or significant lesions EYES: normal,  Conjunctiva are pink and non-injected, sclera clear OROPHARYNX:no exudate, no erythema and lips, buccal mucosa, and tongue normal  NECK: supple, thyroid normal size, non-tender, without nodularity LYMPH:  no palpable lymphadenopathy in the cervical, axillary or inguinal LUNGS: clear to auscultation and percussion with normal breathing effort HEART: regular rate & rhythm and no murmurs ABDOMEN:abdomen soft, non-tender and normal bowel sounds Musculoskeletal:no cyanosis of digits and no clubbing  NEURO: alert & oriented x 3 with fluent speech, no focal motor/sensory deficits  LABORATORY DATA:  I have reviewed the data as listed    Component Value Date/Time   NA 143 01/29/2021  0846   NA 141 11/06/2016 1102   K 3.7 01/29/2021 0846   K 3.9 11/06/2016 1102   CL 106 01/29/2021 0846   CO2 26 01/29/2021 0846   CO2 27 11/06/2016 1102   GLUCOSE 131 (H) 01/29/2021 0846   GLUCOSE 130 11/06/2016 1102   BUN 17 01/29/2021 0846   BUN 18.0 11/06/2016 1102   CREATININE 1.23 (H) 01/29/2021 0846   CREATININE 1.17 (H) 11/01/2020 1032   CREATININE 1.3 (H) 11/06/2016 1102   CALCIUM 9.9 01/29/2021 0846   CALCIUM 10.2 11/06/2016 1102   PROT 6.9 01/29/2021 0846   PROT 7.1 11/06/2016 1102   ALBUMIN 3.7 01/29/2021 0846   ALBUMIN 3.7 11/06/2016 1102   AST 26 01/29/2021 0846   AST 52 (H) 11/06/2016 1102   ALT 20 01/29/2021 0846   ALT 58 (H) 11/06/2016 1102   ALKPHOS 120 01/29/2021 0846   ALKPHOS 159 (H) 11/06/2016 1102   BILITOT 0.4 01/29/2021 0846   BILITOT 0.53 11/06/2016 1102   GFRNONAA 44 (L) 01/29/2021 0846   GFRNONAA 43 (L) 11/01/2020 1032   GFRAA 50 (L) 11/01/2020 1032    No results found for: SPEP, UPEP  Lab Results  Component Value Date   WBC 3.8 (L) 01/29/2021   NEUTROABS 1.9 01/29/2021   HGB 10.1 (L) 01/29/2021   HCT 31.9 (L) 01/29/2021   MCV 95.2 01/29/2021   PLT 130 (L) 01/29/2021      Chemistry      Component Value Date/Time   NA 143 01/29/2021 0846   NA 141 11/06/2016  1102   K 3.7 01/29/2021 0846   K 3.9 11/06/2016 1102   CL 106 01/29/2021 0846   CO2 26 01/29/2021 0846   CO2 27 11/06/2016 1102   BUN 17 01/29/2021 0846   BUN 18.0 11/06/2016 1102   CREATININE 1.23 (H) 01/29/2021 0846   CREATININE 1.17 (H) 11/01/2020 1032   CREATININE 1.3 (H) 11/06/2016 1102      Component Value Date/Time   CALCIUM 9.9 01/29/2021 0846   CALCIUM 10.2 11/06/2016 1102   ALKPHOS 120 01/29/2021 0846   ALKPHOS 159 (H) 11/06/2016 1102   AST 26 01/29/2021 0846   AST 52 (H) 11/06/2016 1102   ALT 20 01/29/2021 0846   ALT 58 (H) 11/06/2016 1102   BILITOT 0.4 01/29/2021 0846   BILITOT 0.53 11/06/2016 1102       RADIOGRAPHIC STUDIES: I have reviewed multiple imaging studies with the patient and her I have personally reviewed the radiological images as listed and agreed with the findings in the report. CT CHEST ABDOMEN PELVIS W CONTRAST  Result Date: 01/29/2021 CLINICAL DATA:  Ovarian cancer, assess treatment response EXAM: CT CHEST, ABDOMEN, AND PELVIS WITH CONTRAST TECHNIQUE: Multidetector CT imaging of the chest, abdomen and pelvis was performed following the standard protocol during bolus administration of intravenous contrast. CONTRAST:  45m OMNIPAQUE IOHEXOL 300 MG/ML SOLN, additional oral enteric contrast COMPARISON:  11/24/2020 FINDINGS: CT CHEST FINDINGS Cardiovascular: Right chest port catheter. Aortic atherosclerosis. Normal heart size. No pericardial effusion. Mediastinum/Nodes: No enlarged mediastinal, hilar, or axillary lymph nodes. Thyroid gland, trachea, and esophagus demonstrate no significant findings. Lungs/Pleura: Lungs are clear. Interval decrease in size of a low-attenuation nodule overlying the right hemidiaphragm, now measuring 1.2 cm, previously 1.8 cm when measured similarly (series 4, image 82). No pleural effusion or pneumothorax. Musculoskeletal: No chest wall mass or suspicious bone lesions identified. Evidence of prior left lumpectomy. CT ABDOMEN  PELVIS FINDINGS Hepatobiliary: No solid liver abnormality is seen. No gallstones,  gallbladder wall thickening, or biliary dilatation. Pancreas: Unremarkable. No pancreatic ductal dilatation or surrounding inflammatory changes. Spleen: Normal in size without significant abnormality. Adrenals/Urinary Tract: Adrenal glands are unremarkable. Kidneys are normal, without renal calculi, solid lesion, or hydronephrosis. Bladder is unremarkable. Stomach/Bowel: Stomach is within normal limits. Appendix appears normal. No evidence of bowel wall thickening, distention, or inflammatory changes. Status post omentectomy. Sigmoid diverticula. Generally large burden of stool throughout the colon and rectum. Vascular/Lymphatic: Aortic atherosclerosis. No enlarged abdominal or pelvic lymph nodes. Reproductive: Status post hysterectomy and oophorectomy. Other: No abdominal wall hernia or abnormality. Interval increase in loculated appearing ascites in the low central abdomen and pelvis (series 2, image 105). There is extensive peritoneal nodularity surrounding this fluid, which is not appreciably changed in appearance compared to prior examination. Unchanged additional peritoneal nodularity about the right colon, index nodule anterior to the cecum measuring 1.4 x 1.3 cm (series 2, image 75). Unchanged peritoneal nodule or mesocolon lymph node adjacent to the proximal sigmoid measuring 1.2 x 1.0 cm (series 2, image 92). Musculoskeletal: No acute or significant osseous findings. IMPRESSION: 1. Interval increase in loculated appearing ascites in the low central abdomen and pelvis. 2. There is extensive peritoneal nodularity surrounding this fluid, the nodularity itself not appreciably changed in appearance compared to prior examination. 3. Additional peritoneal nodularity and/or mesenteric lymph nodes are unchanged. 4. Interval decrease in size of a low-attenuation nodule overlying the right hemidiaphragm, now measuring 1.2 cm, previously  1.8 cm when measured similarly. Findings are consistent with treatment response of a metastatic nodule. No other evidence of intrathoracic metastatic disease. 5. Status post hysterectomy, oophorectomy, and omentectomy. Aortic Atherosclerosis (ICD10-I70.0). Electronically Signed   By: Eddie Candle M.D.   On: 01/29/2021 19:01

## 2021-01-30 NOTE — Assessment & Plan Note (Signed)
She has stable chronic kidney disease stage III Observe closely for now

## 2021-01-30 NOTE — Assessment & Plan Note (Signed)
Her pancytopenia is stable/improved She is not symptomatic We will monitor closely I anticipate she might have worsening pancytopenia while on olaparib and she will return here weekly to get her blood checked over the next few weeks

## 2021-01-30 NOTE — Assessment & Plan Note (Signed)
She complained of bilateral lower extremity edema Her serum albumin is good Her CT imaging is stable It could be related to fluid retention from chronic kidney disease Monitor closely for

## 2021-01-30 NOTE — Assessment & Plan Note (Signed)
I have reviewed multiple imaging studies with the patient and her husband The pelvic fluid collection/ascites may or may not be related to cancer recurrence Postoperative changes could give similar findings The peritoneal nodules are nonspecific and overall stable As previously discussed, we will start her on olaparib maintenance treatment We discussed the risk and benefits and she is interested to start tomorrow She is improved and has received her supply of medications Due to her history of pancytopenia, she will be starting on reduced dose olaparib For the next 3 weeks, she will come back weekly for lab monitoring and virtual visit on the same day to review toxicity and potentially dose adjustment

## 2021-02-06 ENCOUNTER — Encounter: Payer: Self-pay | Admitting: Gynecologic Oncology

## 2021-02-07 ENCOUNTER — Inpatient Hospital Stay (HOSPITAL_BASED_OUTPATIENT_CLINIC_OR_DEPARTMENT_OTHER): Payer: PPO | Admitting: Hematology and Oncology

## 2021-02-07 ENCOUNTER — Inpatient Hospital Stay: Payer: PPO

## 2021-02-07 ENCOUNTER — Encounter: Payer: Self-pay | Admitting: Hematology and Oncology

## 2021-02-07 ENCOUNTER — Other Ambulatory Visit: Payer: Self-pay

## 2021-02-07 DIAGNOSIS — C569 Malignant neoplasm of unspecified ovary: Secondary | ICD-10-CM

## 2021-02-07 DIAGNOSIS — Z95828 Presence of other vascular implants and grafts: Secondary | ICD-10-CM

## 2021-02-07 DIAGNOSIS — E1122 Type 2 diabetes mellitus with diabetic chronic kidney disease: Secondary | ICD-10-CM

## 2021-02-07 DIAGNOSIS — N183 Chronic kidney disease, stage 3 unspecified: Secondary | ICD-10-CM

## 2021-02-07 DIAGNOSIS — Z170 Estrogen receptor positive status [ER+]: Secondary | ICD-10-CM

## 2021-02-07 DIAGNOSIS — D61818 Other pancytopenia: Secondary | ICD-10-CM | POA: Diagnosis not present

## 2021-02-07 DIAGNOSIS — C50212 Malignant neoplasm of upper-inner quadrant of left female breast: Secondary | ICD-10-CM

## 2021-02-07 DIAGNOSIS — C561 Malignant neoplasm of right ovary: Secondary | ICD-10-CM | POA: Diagnosis not present

## 2021-02-07 LAB — CBC WITH DIFFERENTIAL (CANCER CENTER ONLY)
Abs Immature Granulocytes: 0.01 10*3/uL (ref 0.00–0.07)
Basophils Absolute: 0 10*3/uL (ref 0.0–0.1)
Basophils Relative: 1 %
Eosinophils Absolute: 0.1 10*3/uL (ref 0.0–0.5)
Eosinophils Relative: 2 %
HCT: 29.9 % — ABNORMAL LOW (ref 36.0–46.0)
Hemoglobin: 9.9 g/dL — ABNORMAL LOW (ref 12.0–15.0)
Immature Granulocytes: 0 %
Lymphocytes Relative: 22 %
Lymphs Abs: 0.9 10*3/uL (ref 0.7–4.0)
MCH: 30.7 pg (ref 26.0–34.0)
MCHC: 33.1 g/dL (ref 30.0–36.0)
MCV: 92.6 fL (ref 80.0–100.0)
Monocytes Absolute: 0.3 10*3/uL (ref 0.1–1.0)
Monocytes Relative: 8 %
Neutro Abs: 2.7 10*3/uL (ref 1.7–7.7)
Neutrophils Relative %: 67 %
Platelet Count: 122 10*3/uL — ABNORMAL LOW (ref 150–400)
RBC: 3.23 MIL/uL — ABNORMAL LOW (ref 3.87–5.11)
RDW: 17.2 % — ABNORMAL HIGH (ref 11.5–15.5)
WBC Count: 4 10*3/uL (ref 4.0–10.5)
nRBC: 0 % (ref 0.0–0.2)

## 2021-02-07 LAB — CMP (CANCER CENTER ONLY)
ALT: 27 U/L (ref 0–44)
AST: 27 U/L (ref 15–41)
Albumin: 3.4 g/dL — ABNORMAL LOW (ref 3.5–5.0)
Alkaline Phosphatase: 124 U/L (ref 38–126)
Anion gap: 10 (ref 5–15)
BUN: 25 mg/dL — ABNORMAL HIGH (ref 8–23)
CO2: 26 mmol/L (ref 22–32)
Calcium: 9.4 mg/dL (ref 8.9–10.3)
Chloride: 107 mmol/L (ref 98–111)
Creatinine: 1.77 mg/dL — ABNORMAL HIGH (ref 0.44–1.00)
GFR, Estimated: 28 mL/min — ABNORMAL LOW (ref >60)
Glucose, Bld: 195 mg/dL — ABNORMAL HIGH (ref 70–99)
Potassium: 3.6 mmol/L (ref 3.5–5.1)
Sodium: 143 mmol/L (ref 135–145)
Total Bilirubin: 0.6 mg/dL (ref 0.3–1.2)
Total Protein: 6.5 g/dL (ref 6.5–8.1)

## 2021-02-07 MED ORDER — SODIUM CHLORIDE 0.9% FLUSH
10.0000 mL | Freq: Once | INTRAVENOUS | Status: AC
  Administered 2021-02-07: 10 mL
  Filled 2021-02-07: qty 10

## 2021-02-07 MED ORDER — HEPARIN SOD (PORK) LOCK FLUSH 100 UNIT/ML IV SOLN
500.0000 [IU] | Freq: Once | INTRAVENOUS | Status: AC
  Administered 2021-02-07: 500 [IU]
  Filled 2021-02-07: qty 5

## 2021-02-07 NOTE — Assessment & Plan Note (Signed)
She tolerated treatment well Pancytopenia is stable The elevated serum creatinine could be a side effects of olaparib and so his dehydration For now, I recommend she continue same dose of olaparib but discontinue diuretic therapy for a week She will continue weekly blood draw and virtual visit to review toxicity

## 2021-02-07 NOTE — Assessment & Plan Note (Signed)
She has stable chronic kidney disease stage III The acute on chronic renal failure could be exacerbated by elaborate more dehydration I recommend discontinuation of hydrochlorothiazide for a week and increase oral fluid intake Observe closely for now and I plan to recheck next week

## 2021-02-07 NOTE — Progress Notes (Signed)
HEMATOLOGY-ONCOLOGY ELECTRONIC VISIT PROGRESS NOTE  Patient Care Team: [name], MD as PCP - Kinnie Scales, OD as Referring Physician (Optometry) [name], MD as Consulting Physician (Dermatology) Latanya Maudlin, MD as Consulting Physician (Orthopedic Surgery) Inda Castle, MD (Inactive) as Consulting Physician (Gastroenterology) Awanda Mink Craige Cotta, RN as Oncology Nurse Navigator (Oncology) Heath Lark, MD as Consulting Physician (Hematology and Oncology)  I connected with by [facility] video conference   ASSESSMENT & PLAN:  Ovarian cancer [facility]) She tolerated treatment well Pancytopenia is stable The elevated serum creatinine could be a side effects of olaparib and so his dehydration For now, I recommend she continue same dose of olaparib but discontinue diuretic therapy for a week She will continue weekly blood draw and virtual visit to review toxicity  Pancytopenia, acquired [facility]) Her pancytopenia is stable/improved She is not symptomatic We will monitor closely  CKD stage 3 due to type 2 diabetes mellitus ([place]) She has stable chronic kidney disease stage III The acute on chronic renal failure could be exacerbated by elaborate more dehydration I recommend discontinuation of hydrochlorothiazide for a week and increase oral fluid intake Observe closely for now and I plan to recheck next week   No orders of the defined types were placed in this encounter.   INTERVAL HISTORY: Please see below for problem oriented charting. She returns for treatment and follow-up She is doing well No recent nausea or changes in bowel habits She admits she is not drinking enough fluid  SUMMARY OF ONCOLOGIC HISTORY: Oncology History Overview Note  HRD positive   Breast cancer of upper-inner quadrant of left female breast ([place])  02/26/2016 Initial Diagnosis   Left breast biopsy 11:00 position: invasive ductal carcinoma with DCIS, ER 90%, PR 10%, HER-2 negative, Ki-67 30%, grade  2, 2.2 cm palpable lesion T2 N0 stage II a clinical stage   03/18/2016 Surgery   Left lumpectomy: Invasive ductal carcinoma, grade 2, 6.3 cm, with high-grade DCIS, margins negative, 0/4 lymph nodes negative, ER 90%, via 10%, HER-2 negative ratio 0.97, Ki-67 30%, T3 N0 stage IIB   03/25/2016 Procedure   Genetic testing is negative for pathogenic mutations within any of the 20 Genes on the breast/ovarian cancer panel   04/04/2016 Oncotype testing   Oncotype DX recurrence score 37, 25% 10 year distant risk of recurrence   04/17/2016 - 07/31/2016 Chemotherapy   Adjuvant chemotherapy with dose dense Adriamycin and Cytoxan followed by Abraxane weekly 8 ( discontinued due to neuropathy)   09/01/2016 - 09/26/2016 Radiation Therapy   Adj XRT 1) Left breast: 42.5 Gy in 17 fractions. 2) Left breast boost: 7.5 Gy in 3 fractions.   12/02/2016 -  Anti-estrogen oral therapy   Anastrozole 1 mg daily   Ovarian cancer ([place])  04/03/2020 Imaging   US pelvis Complex cystic and solid mass in LEFT adnexa 12.5 cm diameter question cystic ovarian neoplasm; recommend correlation with serum tumor markers and further evaluation by MR imaging with and without contrast.     04/04/2020 Imaging   US venous Doppler No evidence of deep venous thrombosis in the right lower extremity. Left common femoral vein also patent.   04/15/2020 Imaging   MRI pelvis 1. Large complex solid and cystic mass arising from the right adnexa measuring 10.8 by 11.5 by 11.1 cm. This has an aggressive appearance with extensive enhancing mural soft tissue components. Findings are highly suspicious for malignant ovarian neoplasm. 2. Extensive bilateral retroperitoneal and bilateral iliac adenopathy compatible with metastatic disease. 3. Signs of extensive  peritoneal carcinomatosis including ascites, enhancement, thickening and nodularity of the peritoneal reflections, omental caking and bulky peritoneal nodularity. 4. Suspected serosal involvement of  the dome of bladder with loss of normal fat plane. Cannot rule out mural invasion by tumor.   04/17/2020 Tumor Marker   Patient's tumor was tested for the following markers: CA-125 Results of the tumor marker test revealed 1762.   04/20/2020 Cancer Staging   Staging form: Ovary, Fallopian Tube, and Primary Peritoneal Carcinoma, AJCC 8th Edition - Clinical: FIGO Stage IIIC (cT3, cN1, cM0) - Signed by Heath Lark, MD on 04/20/2020   04/23/2020 Imaging   1. Redemonstrated dominant mixed solid and cystic mass arising from the vicinity of the right ovary measuring at least 11.5 x 10.7 cm, not significantly changed compared to prior MR, and consistent with primary ovarian malignancy.  2. Numerous bulky retroperitoneal, bilateral iliac, and pelvic sidewall lymph nodes. 3. Moderate volume ascites throughout the abdomen and pelvis with subtle thickening and nodularity throughout the peritoneum, and extensive bulky nodular metastatic disease of the omentum. 4. Constellation of findings is consistent with advanced nodal and peritoneal metastatic disease. 5. There are prominent subcentimeter epicardial lymph nodes, nonspecific although suspicious for nodal metastatic disease. No definite nodal metastatic disease in the chest. Attention on follow-up. 6. There are multiple small subpleural nodules at the right lung base overlying the diaphragm, measuring up to 7 mm. These are generally nonspecific and less favored to represent pulmonary metastatic disease given distribution. Attention on follow-up. 7. Somewhat coarse contour of the liver, suggestive of cirrhosis, although out without overt morphologic stigmata. 8. Aortic Atherosclerosis (ICD10-I70.0).     04/24/2020 Procedure   Successful placement of a right internal jugular approach power injectable Port-A-Cath. The catheter is ready for immediate use.     05/02/2020 Tumor Marker   Patient's tumor was tested for the following markers: CA-125 Results of the  tumor marker test revealed 1921   05/03/2020 - 12/12/2020 Chemotherapy   The patient had carboplatin and taxol for chemotherapy treatment.     05/08/2020 Procedure   Successful ultrasound-guided paracentesis yielding 2.6 liters of peritoneal fluid.   05/16/2020 Procedure   Successful ultrasound-guided paracentesis yielding 3.2 liters of peritoneal fluid   05/25/2020 Procedure   Successful ultrasound-guided paracentesis yielding 3 liters of peritoneal fluid.     06/01/2020 Procedure   Successful ultrasound-guided therapeutic paracentesis yielding 1.7 liters of peritoneal fluid.   06/14/2020 Tumor Marker   Patient's tumor was tested for the following markers: CA-125 Results of the tumor marker test revealed 1309   06/20/2020 Imaging   1. Dominant mixed cystic and solid mass in the central pelvis is stable in the interval. 2. Clear interval decrease in retroperitoneal and pelvic sidewall lymphadenopathy. The bulky omental disease has also clearly decreased in the interval. 3. Interval decrease in ascites. 4. Stable 8 mm subpleural nodule along the diaphragm. 5. Aortic Atherosclerosis (ICD10-I70.0).   07/10/2020 Tumor Marker   Patient's tumor was tested for the following markers: CA-125 Results of the tumor marker test revealed 220   08/03/2020 Tumor Marker   Patient's tumor was tested for the following markers: CA-125 Results of the tumor marker test revealed 53.3.   09/03/2020 Tumor Marker   Patient's tumor was tested for the following markers: CA-125 Results of the tumor marker test revealed 29.7   09/20/2020 Imaging   1. Substantial improvement. The right ovarian cystic and solid mass is half the volume that it measured on 06/20/2020. Similar reduction in the bulk of the  omental caking of tumor. Prior ascites is resolved and the retroperitoneal adenopathy is markedly improved. 2. Other imaging findings of potential clinical significance: Stable pleural-based nodularity along the right  hemidiaphragm measuring about 1.1 by 0.7 by 0.3 cm. Stable hypodense lesion of the right kidney upper pole, probably a cyst although the configuration of this lesion makes it difficult to obtain accurate density measurements. Left ovarian cyst, stable. Chondrocalcinosis involving the acetabular labra. Mild lumbar spondylosis and degenerative disc disease. 3. Aortic atherosclerosis.     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 (stage ypT3c), predominantly viable with focal fibrosis and hemosiderin laden macrophages (possible mild treatment effect)  B: Ovary and fallopian tube, right, salpingo-oophorectomy - High grade serous carcinoma involving right ovary and fallopian tube with surface involvement, size up to 8 cm  - Areas of necrosis and hemosiderin laden macrophages suggestive of treatment effect - 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 - See synoptic report and comment  C: Ovary and fallopian tube, left, salpingo-oophorectomy - High grade serous carcinoma involving the left fallopian tube, size up to 0.5 cm - Ovary with no definite involvement by carcinoma identified - Nodular area of endometriosis and peritoneal inclusion cyst also present - See synoptic report and comment   Procedure:    Bilateral salpingo-oophorectomy    Procedure:    Omentectomy    Specimen Integrity of Right Ovary:    Intact with surface involvement by tumor    Specimen Integrity of Left Ovary:    Capsule intact   TUMOR Tumor Site:    Right fallopian tube: favor as primary site; also involves right ovary and left fallopian tube  Histologic Type:    Serous carcinoma  Histologic Grade:    High grade  Tumor Size:    Greatest Dimension  (Centimeters): in fallopian tube: 1.0 cm; in ovary: 8.0 cm Ovarian Surface Involvement:    Present    Laterality:    Right  Fallopian Tube Surface Involvement:    Present    Laterality:    Bilateral  Other Tissue / Organ Involvement:    Right ovary  Other Tissue / Organ Involvement:    Right fallopian tube  Other Tissue / Organ Involvement:    Left fallopian tube  Other Tissue / Organ Involvement:    Omentum  Largest Extrapelvic Peritoneal Focus:    Macroscopic (greater than 2 cm)  Peritoneal / Ascitic Fluid:    Not submitted / unknown  Pleural Fluid:    Not submitted / unknown  Treatment Effect:    No definite or minimal response identified (chemotherapy response score [CRS] 1)   LYMPH NODES Regional Lymph Nodes:    No lymph nodes submitted or found   PATHOLOGIC STAGE CLASSIFICATION (pTNM, AJCC 8th Edition) TNM Descriptors:    y (post-treatment)  Primary Tumor (pT):    pT3c  Regional Lymph Nodes (pN):    pNX   FIGO STAGE FIGO Stage:    IIIC   ADDITIONAL FINDINGS Additional Findings:    Serous tubal intraepithelial carcinoma (STIC)  Additional Findings:    Left ovary with no definite carcinoma identified, left-sided nodule of endometriosis and peritoneal inclusion cyst    10/18/2020 - 10/20/2020 Hospital Admission   She was admitted to [place] for interval debulking surgery   11/19/2020 Tumor Marker  Patient's tumor was tested for the following markers: CA-125. Results of the tumor marker test revealed 38.3   12/12/2020 Tumor Marker   Patient's tumor was tested for the following markers CA-125. Results of the tumor marker test revealed 42.3.   01/02/2021 Tumor Marker   Patient's tumor was tested for the following markers: CA-125. Results of the tumor marker test revealed 37.3   01/29/2021 Tumor Marker   Patient's tumor was tested for the following markers: CA-125 Results of the tumor marker test revealed 44.3   01/29/2021 Imaging   1. Interval increase in loculated  appearing ascites in the low central abdomen and pelvis. 2. There is extensive peritoneal nodularity surrounding this fluid, the nodularity itself not appreciably changed in appearance compared to prior examination. 3. Additional peritoneal nodularity and/or mesenteric lymph nodes are unchanged. 4. Interval decrease in size of a low-attenuation nodule overlying the right hemidiaphragm, now measuring 1.2 cm, previously 1.8 cm when measured similarly. Findings are consistent with treatment response of a metastatic nodule. No other evidence of intrathoracic metastatic disease. 5. Status post hysterectomy, oophorectomy, and omentectomy.   01/31/2021 -  Chemotherapy   She is started on Olaparib         REVIEW OF SYSTEMS:   Constitutional: Denies fevers, chills or abnormal weight loss Eyes: Denies blurriness of vision Ears, nose, mouth, throat, and face: Denies mucositis or sore throat Respiratory: Denies cough, dyspnea or wheezes Cardiovascular: Denies palpitation, chest discomfort Gastrointestinal:  Denies nausea, heartburn or change in bowel habits Skin: Denies abnormal skin rashes Lymphatics: Denies new lymphadenopathy or easy bruising Neurological:Denies numbness, tingling or new weaknesses Behavioral/Psych: Mood is stable, no new changes  Extremities: No lower extremity edema All other systems were reviewed with the patient and are negative.  I have reviewed the past medical history, past surgical history, social history and family history with the patient and they are unchanged from previous note.  ALLERGIES:  is allergic to keflex [cephalexin], meloxicam, prednisone, premarin [estrogens conjugated], and trovan [alatrofloxacin].  MEDICATIONS:  Current Outpatient Medications  Medication Sig Dispense Refill  . acetaminophen (TYLENOL) 325 MG tablet Take 325 mg by mouth every 6 (six) hours as needed for headache.    . allopurinol (ZYLOPRIM) 300 MG tablet TAKE 1 TABLET BY MOUTH DAILY  FOR GOUT PREVENTION 90 tablet 1  . blood glucose meter kit and supplies KIT Dispense based on patient and insurance preference. Use to check blood sugar once daily. Dx: E11.22, N18.30 1 each 12  . Cholecalciferol (VITAMIN D3) 5000 units CAPS Take 5,000 Units by mouth daily.    . cyanocobalamin 1000 MCG tablet Take 1,000 mcg by mouth daily.    Marland Kitchen ezetimibe (ZETIA) 10 MG tablet Take 10 mg by mouth daily.    Marland Kitchen gabapentin (NEURONTIN) 300 MG capsule Take 1 capsule in the morning and afternoon and 2 capsules at night 360 capsule 3  . glimepiride (AMARYL) 1 MG tablet Take 1 tab by mouth in the morning if fasting is running 150+. Please do not take this medication if you are ill or not eating as this can cause a low blood sugar. 90 tablet 1  . glucose blood (FREESTYLE TEST STRIPS) test strip Check Blood Sugar Daily as directed (Dx: E11.29) 100 strip 3  . hydrochlorothiazide (HYDRODIURIL) 25 MG tablet Take  1 tablet  Daily  for BP and Fluid Retention /Ankle Swelling 90 tablet 0  . Lancets (FREESTYLE) lancets CHECK BLOOD SUGAR DAILY AS DIRECTED 100 each 3  . lidocaine-prilocaine (EMLA) cream  Apply to affected area once (Patient taking differently: Apply 1 application topically daily as needed (port access).) 30 g 3  . loratadine (CLARITIN) 10 MG tablet Take 10 mg by mouth daily as needed for allergies.    . Magnesium 250 MG TABS Take 250 mg by mouth every other day.    . metoprolol succinate (TOPROL-XL) 25 MG 24 hr tablet TAKE 1 TABLET BY MOUTH DAILY FOR BLOOD PRESSURE (Patient taking differently: Take 25 mg by mouth daily.) 90 tablet 3  . olaparib (LYNPARZA) 100 MG tablet Take 2 tablets (200 mg total) by mouth 2 (two) times daily. Swallow whole. May take with food to decrease nausea and vomiting. 120 tablet 11  . omeprazole (PRILOSEC) 20 MG capsule Take 1 capsule (20 mg total) by mouth daily. 30 capsule 11  . ondansetron (ZOFRAN) 8 MG tablet Take 1 tablet (8 mg total) by mouth 2 (two) times daily as needed for  refractory nausea / vomiting. Start on day 3 after carboplatin chemo. 30 tablet 1  . prochlorperazine (COMPAZINE) 10 MG tablet Take 1 tablet (10 mg total) by mouth every 6 (six) hours as needed for nausea or vomiting. 60 tablet 9  . vitamin C (ASCORBIC ACID) 500 MG tablet Take 500 mg by mouth every evening.      No current facility-administered medications for this visit.    PHYSICAL EXAMINATION: ECOG PERFORMANCE STATUS: 0 - Asymptomatic  LABORATORY DATA:  I have reviewed the data as listed CMP Latest Ref Rng & Units 02/07/2021 01/29/2021 01/02/2021  Glucose 70 - 99 mg/dL 195(H) 131(H) 207(H)  BUN 8 - 23 mg/dL 25(H) 17 26(H)  Creatinine 0.44 - 1.00 mg/dL 1.77(H) 1.23(H) 1.21(H)  Sodium 135 - 145 mmol/L 143 143 139  Potassium 3.5 - 5.1 mmol/L 3.6 3.7 3.5  Chloride 98 - 111 mmol/L 107 106 105  CO2 22 - 32 mmol/L 26 26 25   Calcium 8.9 - 10.3 mg/dL 9.4 9.9 9.6  Total Protein 6.5 - 8.1 g/dL 6.5 6.9 6.9  Total Bilirubin 0.3 - 1.2 mg/dL 0.6 0.4 0.4  Alkaline Phos 38 - 126 U/L 124 120 153(H)  AST 15 - 41 U/L 27 26 38  ALT 0 - 44 U/L 27 20 43    Lab Results  Component Value Date   WBC 4.0 02/07/2021   HGB 9.9 (L) 02/07/2021   HCT 29.9 (L) 02/07/2021   MCV 92.6 02/07/2021   PLT 122 (L) 02/07/2021   NEUTROABS 2.7 02/07/2021     RADIOGRAPHIC STUDIES: I have personally reviewed the radiological images as listed and agreed with the findings in the report. CT CHEST ABDOMEN PELVIS W CONTRAST  Result Date: 01/29/2021 CLINICAL DATA:  Ovarian cancer, assess treatment response EXAM: CT CHEST, ABDOMEN, AND PELVIS WITH CONTRAST TECHNIQUE: Multidetector CT imaging of the chest, abdomen and pelvis was performed following the standard protocol during bolus administration of intravenous contrast. CONTRAST:  4m OMNIPAQUE IOHEXOL 300 MG/ML SOLN, additional oral enteric contrast COMPARISON:  11/24/2020 FINDINGS: CT CHEST FINDINGS Cardiovascular: Right chest port catheter. Aortic atherosclerosis. Normal  heart size. No pericardial effusion. Mediastinum/Nodes: No enlarged mediastinal, hilar, or axillary lymph nodes. Thyroid gland, trachea, and esophagus demonstrate no significant findings. Lungs/Pleura: Lungs are clear. Interval decrease in size of a low-attenuation nodule overlying the right hemidiaphragm, now measuring 1.2 cm, previously 1.8 cm when measured similarly (series 4, image 82). No pleural effusion or pneumothorax. Musculoskeletal: No chest wall mass or suspicious bone lesions identified. Evidence of prior left lumpectomy. CT ABDOMEN PELVIS FINDINGS  Hepatobiliary: No solid liver abnormality is seen. No gallstones, gallbladder wall thickening, or biliary dilatation. Pancreas: Unremarkable. No pancreatic ductal dilatation or surrounding inflammatory changes. Spleen: Normal in size without significant abnormality. Adrenals/Urinary Tract: Adrenal glands are unremarkable. Kidneys are normal, without renal calculi, solid lesion, or hydronephrosis. Bladder is unremarkable. Stomach/Bowel: Stomach is within normal limits. Appendix appears normal. No evidence of bowel wall thickening, distention, or inflammatory changes. Status post omentectomy. Sigmoid diverticula. Generally large burden of stool throughout the colon and rectum. Vascular/Lymphatic: Aortic atherosclerosis. No enlarged abdominal or pelvic lymph nodes. Reproductive: Status post hysterectomy and oophorectomy. Other: No abdominal wall hernia or abnormality. Interval increase in loculated appearing ascites in the low central abdomen and pelvis (series 2, image 105). There is extensive peritoneal nodularity surrounding this fluid, which is not appreciably changed in appearance compared to prior examination. Unchanged additional peritoneal nodularity about the right colon, index nodule anterior to the cecum measuring 1.4 x 1.3 cm (series 2, image 75). Unchanged peritoneal nodule or mesocolon lymph node adjacent to the proximal sigmoid measuring 1.2 x 1.0  cm (series 2, image 92). Musculoskeletal: No acute or significant osseous findings. IMPRESSION: 1. Interval increase in loculated appearing ascites in the low central abdomen and pelvis. 2. There is extensive peritoneal nodularity surrounding this fluid, the nodularity itself not appreciably changed in appearance compared to prior examination. 3. Additional peritoneal nodularity and/or mesenteric lymph nodes are unchanged. 4. Interval decrease in size of a low-attenuation nodule overlying the right hemidiaphragm, now measuring 1.2 cm, previously 1.8 cm when measured similarly. Findings are consistent with treatment response of a metastatic nodule. No other evidence of intrathoracic metastatic disease. 5. Status post hysterectomy, oophorectomy, and omentectomy. Aortic Atherosclerosis (ICD10-I70.0). Electronically Signed   By: Eddie Candle M.D.   On: 01/29/2021 19:01    I discussed the assessment and treatment plan with the patient. The patient was provided an opportunity to ask questions and all were answered. The patient agreed with the plan and demonstrated an understanding of the instructions. The patient was advised to call back or seek an in-person evaluation if the symptoms worsen or if the condition fails to improve as anticipated.    I spent 20 minutes for the appointment reviewing test results, discuss management and coordination of care.  Heath Lark, MD 02/07/2021 1:50 PM

## 2021-02-07 NOTE — Assessment & Plan Note (Signed)
Her pancytopenia is stable/improved She is not symptomatic We will monitor closely

## 2021-02-14 ENCOUNTER — Encounter: Payer: Self-pay | Admitting: Hematology and Oncology

## 2021-02-14 ENCOUNTER — Inpatient Hospital Stay: Payer: PPO

## 2021-02-14 ENCOUNTER — Other Ambulatory Visit: Payer: Self-pay

## 2021-02-14 ENCOUNTER — Inpatient Hospital Stay (HOSPITAL_BASED_OUTPATIENT_CLINIC_OR_DEPARTMENT_OTHER): Payer: PPO | Admitting: Hematology and Oncology

## 2021-02-14 DIAGNOSIS — C569 Malignant neoplasm of unspecified ovary: Secondary | ICD-10-CM

## 2021-02-14 DIAGNOSIS — C50212 Malignant neoplasm of upper-inner quadrant of left female breast: Secondary | ICD-10-CM

## 2021-02-14 DIAGNOSIS — C561 Malignant neoplasm of right ovary: Secondary | ICD-10-CM | POA: Diagnosis not present

## 2021-02-14 DIAGNOSIS — D61818 Other pancytopenia: Secondary | ICD-10-CM | POA: Diagnosis not present

## 2021-02-14 DIAGNOSIS — E1122 Type 2 diabetes mellitus with diabetic chronic kidney disease: Secondary | ICD-10-CM

## 2021-02-14 DIAGNOSIS — I129 Hypertensive chronic kidney disease with stage 1 through stage 4 chronic kidney disease, or unspecified chronic kidney disease: Secondary | ICD-10-CM | POA: Diagnosis not present

## 2021-02-14 DIAGNOSIS — N183 Chronic kidney disease, stage 3 unspecified: Secondary | ICD-10-CM | POA: Diagnosis not present

## 2021-02-14 DIAGNOSIS — R600 Localized edema: Secondary | ICD-10-CM | POA: Diagnosis not present

## 2021-02-14 DIAGNOSIS — N1832 Chronic kidney disease, stage 3b: Secondary | ICD-10-CM | POA: Diagnosis not present

## 2021-02-14 DIAGNOSIS — Z170 Estrogen receptor positive status [ER+]: Secondary | ICD-10-CM

## 2021-02-14 DIAGNOSIS — Z95828 Presence of other vascular implants and grafts: Secondary | ICD-10-CM

## 2021-02-14 LAB — CMP (CANCER CENTER ONLY)
ALT: 20 U/L (ref 0–44)
AST: 28 U/L (ref 15–41)
Albumin: 3.5 g/dL (ref 3.5–5.0)
Alkaline Phosphatase: 127 U/L — ABNORMAL HIGH (ref 38–126)
Anion gap: 8 (ref 5–15)
BUN: 27 mg/dL — ABNORMAL HIGH (ref 8–23)
CO2: 24 mmol/L (ref 22–32)
Calcium: 9.4 mg/dL (ref 8.9–10.3)
Chloride: 110 mmol/L (ref 98–111)
Creatinine: 1.6 mg/dL — ABNORMAL HIGH (ref 0.44–1.00)
GFR, Estimated: 32 mL/min — ABNORMAL LOW (ref >60)
Glucose, Bld: 162 mg/dL — ABNORMAL HIGH (ref 70–99)
Potassium: 3.7 mmol/L (ref 3.5–5.1)
Sodium: 142 mmol/L (ref 135–145)
Total Bilirubin: 0.5 mg/dL (ref 0.3–1.2)
Total Protein: 6.5 g/dL (ref 6.5–8.1)

## 2021-02-14 LAB — CBC WITH DIFFERENTIAL (CANCER CENTER ONLY)
Abs Immature Granulocytes: 0.03 10*3/uL (ref 0.00–0.07)
Basophils Absolute: 0 10*3/uL (ref 0.0–0.1)
Basophils Relative: 0 %
Eosinophils Absolute: 0.1 10*3/uL (ref 0.0–0.5)
Eosinophils Relative: 2 %
HCT: 28 % — ABNORMAL LOW (ref 36.0–46.0)
Hemoglobin: 9.2 g/dL — ABNORMAL LOW (ref 12.0–15.0)
Immature Granulocytes: 1 %
Lymphocytes Relative: 29 %
Lymphs Abs: 0.9 10*3/uL (ref 0.7–4.0)
MCH: 30.9 pg (ref 26.0–34.0)
MCHC: 32.9 g/dL (ref 30.0–36.0)
MCV: 94 fL (ref 80.0–100.0)
Monocytes Absolute: 0.3 10*3/uL (ref 0.1–1.0)
Monocytes Relative: 10 %
Neutro Abs: 1.9 10*3/uL (ref 1.7–7.7)
Neutrophils Relative %: 58 %
Platelet Count: 106 10*3/uL — ABNORMAL LOW (ref 150–400)
RBC: 2.98 MIL/uL — ABNORMAL LOW (ref 3.87–5.11)
RDW: 17.5 % — ABNORMAL HIGH (ref 11.5–15.5)
WBC Count: 3.3 10*3/uL — ABNORMAL LOW (ref 4.0–10.5)
nRBC: 0 % (ref 0.0–0.2)

## 2021-02-14 MED ORDER — HEPARIN SOD (PORK) LOCK FLUSH 100 UNIT/ML IV SOLN
500.0000 [IU] | Freq: Once | INTRAVENOUS | Status: DC | PRN
  Filled 2021-02-14: qty 5

## 2021-02-14 MED ORDER — SODIUM CHLORIDE 0.9% FLUSH
10.0000 mL | Freq: Once | INTRAVENOUS | Status: AC | PRN
  Administered 2021-02-14: 10 mL
  Filled 2021-02-14: qty 10

## 2021-02-14 MED ORDER — OLAPARIB 100 MG PO TABS: 100.0000 mg | ORAL_TABLET | Freq: Two times a day (BID) | ORAL | 11 refills | Status: DC

## 2021-02-14 NOTE — Assessment & Plan Note (Signed)
She has stable chronic kidney disease stage III Observe closely for now and I plan to recheck next week

## 2021-02-14 NOTE — Progress Notes (Addendum)
HEMATOLOGY-ONCOLOGY ELECTRONIC VISIT PROGRESS NOTE  Patient Care Team: [name], MD as PCP - General Macarthur Critchley, OD as Referring Physician (Optometry) [name], MD as Consulting Physician (Dermatology) Latanya Maudlin, MD as Consulting Physician (Orthopedic Surgery) Inda Castle, MD (Inactive) as Consulting Physician (Gastroenterology) Jacqulyn Liner, RN as Oncology Nurse Navigator (Oncology) Heath Lark, MD as Consulting Physician (Hematology and Oncology)  I connected with by phone visit ASSESSMENT & PLAN:  Ovarian cancer [facility]) According to her husband, she is not symptomatic from [place] However, due to progressive pancytopenia, I plan to reduce Lynparza to 100 mg twice a day Her husband verified that he understood the plan I will get my nursing staff to call her again to make sure that she understood the plan of care She will return next week to get her labs rechecked  Pancytopenia, acquired [facility]) She has slight worsening pancytopenia due to treatment As above, I plan to reduce the dose According to her husband, she is not symptomatic  CKD stage 3 due to type 2 diabetes mellitus ([place]) She has stable chronic kidney disease stage III Observe closely for now and I plan to recheck next week   No orders of the defined types were placed in this encounter.  I spoke to the patient directly in the evening and verify she agree with the plan of care INTERVAL HISTORY: Please see below for problem oriented charting. I tried to call the patient several times Her husband picked up the phone The patient is currently attending nephrology visit According to her husband, she feels well without side effects of Lynparza  SUMMARY OF ONCOLOGIC HISTORY: Oncology History Overview Note  HRD positive   Breast cancer of upper-inner quadrant of left female breast ([place])  02/26/2016 Initial Diagnosis   Left breast biopsy 11:00 position: invasive ductal carcinoma with DCIS, ER 90%,  PR 10%, HER-2 negative, Ki-67 30%, grade 2, 2.2 cm palpable lesion T2 N0 stage II a clinical stage   03/18/2016 Surgery   Left lumpectomy: Invasive ductal carcinoma, grade 2, 6.3 cm, with high-grade DCIS, margins negative, 0/4 lymph nodes negative, ER 90%, via 10%, HER-2 negative ratio 0.97, Ki-67 30%, T3 N0 stage IIB   03/25/2016 Procedure   Genetic testing is negative for pathogenic mutations within any of the 20 Genes on the breast/ovarian cancer panel   04/04/2016 Oncotype testing   Oncotype DX recurrence score 37, 25% 10 year distant risk of recurrence   04/17/2016 - 07/31/2016 Chemotherapy   Adjuvant chemotherapy with dose dense Adriamycin and Cytoxan followed by Abraxane weekly 8 ( discontinued due to neuropathy)   09/01/2016 - 09/26/2016 Radiation Therapy   Adj XRT 1) Left breast: 42.5 Gy in 17 fractions. 2) Left breast boost: 7.5 Gy in 3 fractions.   12/02/2016 -  Anti-estrogen oral therapy   Anastrozole 1 mg daily   Ovarian cancer ([place])  04/03/2020 Imaging   US pelvis Complex cystic and solid mass in LEFT adnexa 12.5 cm diameter question cystic ovarian neoplasm; recommend correlation with serum tumor markers and further evaluation by MR imaging with and without contrast.     04/04/2020 Imaging   US venous Doppler No evidence of deep venous thrombosis in the right lower extremity. Left common femoral vein also patent.   04/15/2020 Imaging   MRI pelvis 1. Large complex solid and cystic mass arising from the right adnexa measuring 10.8 by 11.5 by 11.1 cm. This has an aggressive appearance with extensive enhancing mural soft tissue components. Findings are highly suspicious  for malignant ovarian neoplasm. 2. Extensive bilateral retroperitoneal and bilateral iliac adenopathy compatible with metastatic disease. 3. Signs of extensive peritoneal carcinomatosis including ascites, enhancement, thickening and nodularity of the peritoneal reflections, omental caking and bulky peritoneal  nodularity. 4. Suspected serosal involvement of the dome of bladder with loss of normal fat plane. Cannot rule out mural invasion by tumor.   04/17/2020 Tumor Marker   Patient's tumor was tested for the following markers: CA-125 Results of the tumor marker test revealed 1762.   04/20/2020 Cancer Staging   Staging form: Ovary, Fallopian Tube, and Primary Peritoneal Carcinoma, AJCC 8th Edition - Clinical: FIGO Stage IIIC (cT3, cN1, cM0) - Signed by Heath Lark, MD on 04/20/2020   04/23/2020 Imaging   1. Redemonstrated dominant mixed solid and cystic mass arising from the vicinity of the right ovary measuring at least 11.5 x 10.7 cm, not significantly changed compared to prior MR, and consistent with primary ovarian malignancy.  2. Numerous bulky retroperitoneal, bilateral iliac, and pelvic sidewall lymph nodes. 3. Moderate volume ascites throughout the abdomen and pelvis with subtle thickening and nodularity throughout the peritoneum, and extensive bulky nodular metastatic disease of the omentum. 4. Constellation of findings is consistent with advanced nodal and peritoneal metastatic disease. 5. There are prominent subcentimeter epicardial lymph nodes, nonspecific although suspicious for nodal metastatic disease. No definite nodal metastatic disease in the chest. Attention on follow-up. 6. There are multiple small subpleural nodules at the right lung base overlying the diaphragm, measuring up to 7 mm. These are generally nonspecific and less favored to represent pulmonary metastatic disease given distribution. Attention on follow-up. 7. Somewhat coarse contour of the liver, suggestive of cirrhosis, although out without overt morphologic stigmata. 8. Aortic Atherosclerosis (ICD10-I70.0).     04/24/2020 Procedure   Successful placement of a right internal jugular approach power injectable Port-A-Cath. The catheter is ready for immediate use.     05/02/2020 Tumor Marker   Patient's tumor was tested  for the following markers: CA-125 Results of the tumor marker test revealed 1921   05/03/2020 - 12/12/2020 Chemotherapy   The patient had carboplatin and taxol for chemotherapy treatment.     05/08/2020 Procedure   Successful ultrasound-guided paracentesis yielding 2.6 liters of peritoneal fluid.   05/16/2020 Procedure   Successful ultrasound-guided paracentesis yielding 3.2 liters of peritoneal fluid   05/25/2020 Procedure   Successful ultrasound-guided paracentesis yielding 3 liters of peritoneal fluid.     06/01/2020 Procedure   Successful ultrasound-guided therapeutic paracentesis yielding 1.7 liters of peritoneal fluid.   06/14/2020 Tumor Marker   Patient's tumor was tested for the following markers: CA-125 Results of the tumor marker test revealed 1309   06/20/2020 Imaging   1. Dominant mixed cystic and solid mass in the central pelvis is stable in the interval. 2. Clear interval decrease in retroperitoneal and pelvic sidewall lymphadenopathy. The bulky omental disease has also clearly decreased in the interval. 3. Interval decrease in ascites. 4. Stable 8 mm subpleural nodule along the diaphragm. 5. Aortic Atherosclerosis (ICD10-I70.0).   07/10/2020 Tumor Marker   Patient's tumor was tested for the following markers: CA-125 Results of the tumor marker test revealed 220   08/03/2020 Tumor Marker   Patient's tumor was tested for the following markers: CA-125 Results of the tumor marker test revealed 53.3.   09/03/2020 Tumor Marker   Patient's tumor was tested for the following markers: CA-125 Results of the tumor marker test revealed 29.7   09/20/2020 Imaging   1. Substantial improvement. The right ovarian  cystic and solid mass is half the volume that it measured on 06/20/2020. Similar reduction in the bulk of the omental caking of tumor. Prior ascites is resolved and the retroperitoneal adenopathy is markedly improved. 2. Other imaging findings of potential clinical significance:  Stable pleural-based nodularity along the right hemidiaphragm measuring about 1.1 by 0.7 by 0.3 cm. Stable hypodense lesion of the right kidney upper pole, probably a cyst although the configuration of this lesion makes it difficult to obtain accurate density measurements. Left ovarian cyst, stable. Chondrocalcinosis involving the acetabular labra. Mild lumbar spondylosis and degenerative disc disease. 3. Aortic atherosclerosis.     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 (stage ypT3c), predominantly viable with focal fibrosis and hemosiderin laden macrophages (possible mild treatment effect)  B: Ovary and fallopian tube, right, salpingo-oophorectomy - High grade serous carcinoma involving right ovary and fallopian tube with surface involvement, size up to 8 cm  - Areas of necrosis and hemosiderin laden macrophages suggestive of treatment effect - 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 - See synoptic report and comment  C: Ovary and fallopian tube, left, salpingo-oophorectomy - High grade serous carcinoma involving the left fallopian tube, size up to 0.5 cm - Ovary with no definite involvement by carcinoma identified - Nodular area of endometriosis and peritoneal inclusion cyst also present - See synoptic report and comment   Procedure:    Bilateral salpingo-oophorectomy    Procedure:    Omentectomy    Specimen Integrity of Right Ovary:    Intact with surface involvement by tumor    Specimen Integrity of Left Ovary:    Capsule intact   TUMOR Tumor Site:    Right fallopian tube: favor as primary site; also involves right ovary and left fallopian tube  Histologic Type:    Serous carcinoma  Histologic Grade:    High  grade  Tumor Size:    Greatest Dimension (Centimeters): in fallopian tube: 1.0 cm; in ovary: 8.0 cm Ovarian Surface Involvement:    Present    Laterality:    Right  Fallopian Tube Surface Involvement:    Present    Laterality:    Bilateral  Other Tissue / Organ Involvement:    Right ovary  Other Tissue / Organ Involvement:    Right fallopian tube  Other Tissue / Organ Involvement:    Left fallopian tube  Other Tissue / Organ Involvement:    Omentum  Largest Extrapelvic Peritoneal Focus:    Macroscopic (greater than 2 cm)  Peritoneal / Ascitic Fluid:    Not submitted / unknown  Pleural Fluid:    Not submitted / unknown  Treatment Effect:    No definite or minimal response identified (chemotherapy response score [CRS] 1)   LYMPH NODES Regional Lymph Nodes:    No lymph nodes submitted or found   PATHOLOGIC STAGE CLASSIFICATION (pTNM, AJCC 8th Edition) TNM Descriptors:    y (post-treatment)  Primary Tumor (pT):    pT3c  Regional Lymph Nodes (pN):    pNX   FIGO STAGE FIGO Stage:    IIIC   ADDITIONAL FINDINGS Additional Findings:    Serous tubal intraepithelial carcinoma (STIC)  Additional Findings:    Left ovary with no definite carcinoma identified, left-sided nodule of endometriosis and peritoneal inclusion cyst    10/18/2020 - 10/20/2020  Hospital Admission   She was admitted to [facility] for interval debulking surgery   11/19/2020 Tumor Marker   Patient's tumor was tested for the following markers: CA-125. Results of the tumor marker test revealed 38.3   12/12/2020 Tumor Marker   Patient's tumor was tested for the following markers CA-125. Results of the tumor marker test revealed 42.3.   01/02/2021 Tumor Marker   Patient's tumor was tested for the following markers: CA-125. Results of the tumor marker test revealed 37.3   01/29/2021 Tumor Marker   Patient's tumor was tested for the following markers: CA-125 Results of the tumor marker test revealed 44.3   01/29/2021  Imaging   1. Interval increase in loculated appearing ascites in the low central abdomen and pelvis. 2. There is extensive peritoneal nodularity surrounding this fluid, the nodularity itself not appreciably changed in appearance compared to prior examination. 3. Additional peritoneal nodularity and/or mesenteric lymph nodes are unchanged. 4. Interval decrease in size of a low-attenuation nodule overlying the right hemidiaphragm, now measuring 1.2 cm, previously 1.8 cm when measured similarly. Findings are consistent with treatment response of a metastatic nodule. No other evidence of intrathoracic metastatic disease. 5. Status post hysterectomy, oophorectomy, and omentectomy.   01/31/2021 -  Chemotherapy   She is started on Olaparib         REVIEW OF SYSTEMS:  Unable to obtain ALLERGIES:  is allergic to keflex [cephalexin], meloxicam, prednisone, premarin [estrogens conjugated], and trovan [alatrofloxacin].  MEDICATIONS:  Current Outpatient Medications  Medication Sig Dispense Refill  . acetaminophen (TYLENOL) 325 MG tablet Take 325 mg by mouth every 6 (six) hours as needed for headache.    . allopurinol (ZYLOPRIM) 300 MG tablet TAKE 1 TABLET BY MOUTH DAILY FOR GOUT PREVENTION 90 tablet 1  . blood glucose meter kit and supplies KIT Dispense based on patient and insurance preference. Use to check blood sugar once daily. Dx: E11.22, N18.30 1 each 12  . Cholecalciferol (VITAMIN D3) 5000 units CAPS Take 5,000 Units by mouth daily.    . cyanocobalamin 1000 MCG tablet Take 1,000 mcg by mouth daily.    Marland Kitchen ezetimibe (ZETIA) 10 MG tablet Take 10 mg by mouth daily.    Marland Kitchen gabapentin (NEURONTIN) 300 MG capsule Take 1 capsule in the morning and afternoon and 2 capsules at night 360 capsule 3  . glimepiride (AMARYL) 1 MG tablet Take 1 tab by mouth in the morning if fasting is running 150+. Please do not take this medication if you are ill or not eating as this can cause a low blood sugar. 90 tablet 1  .  glucose blood (FREESTYLE TEST STRIPS) test strip Check Blood Sugar Daily as directed (Dx: E11.29) 100 strip 3  . hydrochlorothiazide (HYDRODIURIL) 25 MG tablet Take  1 tablet  Daily  for BP and Fluid Retention /Ankle Swelling 90 tablet 0  . Lancets (FREESTYLE) lancets CHECK BLOOD SUGAR DAILY AS DIRECTED 100 each 3  . lidocaine-prilocaine (EMLA) cream Apply to affected area once (Patient taking differently: Apply 1 application topically daily as needed (port access).) 30 g 3  . loratadine (CLARITIN) 10 MG tablet Take 10 mg by mouth daily as needed for allergies.    . Magnesium 250 MG TABS Take 250 mg by mouth every other day.    . metoprolol succinate (TOPROL-XL) 25 MG 24 hr tablet TAKE 1 TABLET BY MOUTH DAILY FOR BLOOD PRESSURE (Patient taking differently: Take 25 mg by mouth daily.) 90 tablet 3  . olaparib (LYNPARZA) 100  MG tablet Take 1 tablet (100 mg total) by mouth 2 (two) times daily. Swallow whole. May take with food to decrease nausea and vomiting. 120 tablet 11  . omeprazole (PRILOSEC) 20 MG capsule Take 1 capsule (20 mg total) by mouth daily. 30 capsule 11  . ondansetron (ZOFRAN) 8 MG tablet Take 1 tablet (8 mg total) by mouth 2 (two) times daily as needed for refractory nausea / vomiting. Start on day 3 after carboplatin chemo. 30 tablet 1  . prochlorperazine (COMPAZINE) 10 MG tablet Take 1 tablet (10 mg total) by mouth every 6 (six) hours as needed for nausea or vomiting. 60 tablet 9  . vitamin C (ASCORBIC ACID) 500 MG tablet Take 500 mg by mouth every evening.      No current facility-administered medications for this visit.     LABORATORY DATA:  I have reviewed the data as listed CMP Latest Ref Rng & Units 02/14/2021 02/07/2021 01/29/2021  Glucose 70 - 99 mg/dL 162(H) 195(H) 131(H)  BUN 8 - 23 mg/dL 27(H) 25(H) 17  Creatinine 0.44 - 1.00 mg/dL 1.60(H) 1.77(H) 1.23(H)  Sodium 135 - 145 mmol/L 142 143 143  Potassium 3.5 - 5.1 mmol/L 3.7 3.6 3.7  Chloride 98 - 111 mmol/L 110 107 106   CO2 22 - 32 mmol/L _0 Calcium 8.9 - 10.3 mg/dL 9.4 9.4 9.9  Total Protein 6.5 - 8.1 g/dL 6.5 6.5 6.9  Total Bilirubin 0.3 - 1.2 mg/dL 0.5 0.6 0.4  Alkaline Phos 38 - 126 U/L 127(H) 124 120  AST 15 - 41 U/L _1 ALT 0 - 44 U/L _2 Lab Results  Component Value Date   WBC 3.3 (L) 02/14/2021   HGB 9.2 (L) 02/14/2021   HCT 28.0 (L) 02/14/2021   MCV 94.0 02/14/2021   PLT 106 (L) 02/14/2021   NEUTROABS 1.9 02/14/2021     RADIOGRAPHIC STUDIES: I have personally reviewed the radiological images as listed and agreed with the findings in the report. CT CHEST ABDOMEN PELVIS W CONTRAST  Result Date: 01/29/2021 CLINICAL DATA:  Ovarian cancer, assess treatment response EXAM: CT CHEST, ABDOMEN, AND PELVIS WITH CONTRAST TECHNIQUE: Multidetector CT imaging of the chest, abdomen and pelvis was performed following the standard protocol during bolus administration of intravenous contrast. CONTRAST:  24m OMNIPAQUE IOHEXOL 300 MG/ML SOLN, additional oral enteric contrast COMPARISON:  11/24/2020 FINDINGS: CT CHEST FINDINGS Cardiovascular: Right chest port catheter. Aortic atherosclerosis. Normal heart size. No pericardial effusion. Mediastinum/Nodes: No enlarged mediastinal, hilar, or axillary lymph nodes. Thyroid gland, trachea, and esophagus demonstrate no significant findings. Lungs/Pleura: Lungs are clear. Interval decrease in size of a low-attenuation nodule overlying the right hemidiaphragm, now measuring 1.2 cm, previously 1.8 cm when measured similarly (series 4, image 82). No pleural effusion or pneumothorax. Musculoskeletal: No chest wall mass or suspicious bone lesions identified. Evidence of prior left lumpectomy. CT ABDOMEN PELVIS FINDINGS Hepatobiliary: No solid liver abnormality is seen. No gallstones, gallbladder wall thickening, or biliary dilatation. Pancreas: Unremarkable. No pancreatic ductal dilatation or surrounding inflammatory changes. Spleen: Normal in size without  significant abnormality. Adrenals/Urinary Tract: Adrenal glands are unremarkable. Kidneys are normal, without renal calculi, solid lesion, or hydronephrosis. Bladder is unremarkable. Stomach/Bowel: Stomach is within normal limits. Appendix appears normal. No evidence of bowel wall thickening, distention, or inflammatory changes. Status post omentectomy. Sigmoid diverticula. Generally large burden of stool throughout the colon and rectum. Vascular/Lymphatic: Aortic atherosclerosis. No enlarged abdominal or pelvic lymph nodes. Reproductive: Status post  hysterectomy and oophorectomy. Other: No abdominal wall hernia or abnormality. Interval increase in loculated appearing ascites in the low central abdomen and pelvis (series 2, image 105). There is extensive peritoneal nodularity surrounding this fluid, which is not appreciably changed in appearance compared to prior examination. Unchanged additional peritoneal nodularity about the right colon, index nodule anterior to the cecum measuring 1.4 x 1.3 cm (series 2, image 75). Unchanged peritoneal nodule or mesocolon lymph node adjacent to the proximal sigmoid measuring 1.2 x 1.0 cm (series 2, image 92). Musculoskeletal: No acute or significant osseous findings. IMPRESSION: 1. Interval increase in loculated appearing ascites in the low central abdomen and pelvis. 2. There is extensive peritoneal nodularity surrounding this fluid, the nodularity itself not appreciably changed in appearance compared to prior examination. 3. Additional peritoneal nodularity and/or mesenteric lymph nodes are unchanged. 4. Interval decrease in size of a low-attenuation nodule overlying the right hemidiaphragm, now measuring 1.2 cm, previously 1.8 cm when measured similarly. Findings are consistent with treatment response of a metastatic nodule. No other evidence of intrathoracic metastatic disease. 5. Status post hysterectomy, oophorectomy, and omentectomy. Aortic Atherosclerosis (ICD10-I70.0).  Electronically Signed   By: Eddie Candle M.D.   On: 01/29/2021 19:01    I discussed the assessment and treatment plan with the patient's husband Heath Lark, MD 02/14/2021 3:37 PM

## 2021-02-14 NOTE — Assessment & Plan Note (Signed)
According to her husband, she is not symptomatic from [place] However, due to progressive pancytopenia, I plan to reduce Lynparza to 100 mg twice a day Her husband verified that he understood the plan I will get my nursing staff to call her again to make sure that she understood the plan of care She will return next week to get her labs rechecked

## 2021-02-14 NOTE — Patient Instructions (Signed)
Implanted [facility] Guide An implanted port is a device that is placed under the skin. It is usually placed in the chest. The device can be used to give IV medicine, to take blood, or for dialysis. You may have an implanted port if:  You need IV medicine that would be irritating to the small veins in your hands or arms.  You need IV medicines, such as antibiotics, for a long period of time.  You need IV nutrition for a long period of time.  You need dialysis. When you have a port, your health care provider can choose to use the port instead of veins in your arms for these procedures. You may have fewer limitations when using a port than you would if you used other types of long-term IVs, and you will likely be able to return to normal activities after your incision heals. An implanted port has two main parts:  Reservoir. The reservoir is the part where a needle is inserted to give medicines or draw blood. The reservoir is round. After it is placed, it appears as a small, raised area under your skin.  Catheter. The catheter is a thin, flexible tube that connects the reservoir to a vein. Medicine that is inserted into the reservoir goes into the catheter and then into the vein. How is my port accessed? To access your port:  A numbing cream may be placed on the skin over the port site.  Your health care provider will put on a mask and sterile gloves.  The skin over your port will be cleaned carefully with a germ-killing soap and allowed to dry.  Your health care provider will gently pinch the port and insert a needle into it.  Your health care provider will check for a blood return to make sure the port is in the vein and is not clogged.  If your port needs to remain accessed to get medicine continuously (constant infusion), your health care provider will place a clear bandage (dressing) over the needle site. The dressing and needle will need to be changed every week, or as told by your  health care provider. What is flushing? Flushing helps keep the port from getting clogged. Follow instructions from your health care provider about how and when to flush the port. Ports are usually flushed with saline solution or a medicine called heparin. The need for flushing will depend on how the port is used:  If the port is only used from time to time to give medicines or draw blood, the port may need to be flushed: ? Before and after medicines have been given. ? Before and after blood has been drawn. ? As part of routine maintenance. Flushing may be recommended every 4-6 weeks.  If a constant infusion is running, the port may not need to be flushed.  Throw away any syringes in a disposal container that is meant for sharp items (sharps container). You can buy a sharps container from a pharmacy, or you can make one by using an empty hard plastic bottle with a cover. How long will my port stay implanted? The port can stay in for as long as your health care provider thinks it is needed. When it is time for the port to come out, a surgery will be done to remove it. The surgery will be similar to the procedure that was done to put the port in. Follow these instructions at home:  Flush your port as told by your health care  provider.  If you need an infusion over several days, follow instructions from your health care provider about how to take care of your port site. Make sure you: ? Wash your hands with soap and water before you change your dressing. If soap and water are not available, use alcohol-based hand sanitizer. ? Change your dressing as told by your health care provider. ? Place any used dressings or infusion bags into a plastic bag. Throw that bag in the trash. ? Keep the dressing that covers the needle clean and dry. Do not get it wet. ? Do not use scissors or sharp objects near the tube. ? Keep the tube clamped, unless it is being used.  Check your port site every day for signs  of infection. Check for: ? Redness, swelling, or pain. ? Fluid or blood. ? Pus or a bad smell.  Protect the skin around the port site. ? Avoid wearing bra straps that rub or irritate the site. ? Protect the skin around your port from seat belts. Place a soft pad over your chest if needed.  Bathe or shower as told by your health care provider. The site may get wet as long as you are not actively receiving an infusion.  Return to your normal activities as told by your health care provider. Ask your health care provider what activities are safe for you.  Carry a medical alert card or wear a medical alert bracelet at all times. This will let health care providers know that you have an implanted port in case of an emergency.   Get help right away if:  You have redness, swelling, or pain at the port site.  You have fluid or blood coming from your port site.  You have pus or a bad smell coming from the port site.  You have a fever. Summary  Implanted ports are usually placed in the chest for long-term IV access.  Follow instructions from your health care provider about flushing the port and changing bandages (dressings).  Take care of the area around your port by avoiding clothing that puts pressure on the area, and by watching for signs of infection.  Protect the skin around your port from seat belts. Place a soft pad over your chest if needed.  Get help right away if you have a fever or you have redness, swelling, pain, drainage, or a bad smell at the port site. This information is not intended to replace advice given to you by your health care provider. Make sure you discuss any questions you have with your health care provider. Document Revised: 04/02/2020 Document Reviewed: 04/02/2020 Elsevier Patient Education  [place].

## 2021-02-14 NOTE — Assessment & Plan Note (Signed)
She has slight worsening pancytopenia due to treatment As above, I plan to reduce the dose According to her husband, she is not symptomatic

## 2021-02-19 ENCOUNTER — Other Ambulatory Visit: Payer: Self-pay | Admitting: Nephrology

## 2021-02-19 DIAGNOSIS — R600 Localized edema: Secondary | ICD-10-CM

## 2021-02-19 DIAGNOSIS — N1832 Chronic kidney disease, stage 3b: Secondary | ICD-10-CM

## 2021-02-20 ENCOUNTER — Ambulatory Visit
Admission: RE | Admit: 2021-02-20 | Discharge: 2021-02-20 | Disposition: A | Discharge status: Home / Self Care | Payer: PPO | Source: Ambulatory Visit | Attending: Nephrology | Admitting: Nephrology

## 2021-02-20 DIAGNOSIS — N1832 Chronic kidney disease, stage 3b: Secondary | ICD-10-CM

## 2021-02-20 DIAGNOSIS — R600 Localized edema: Secondary | ICD-10-CM | POA: Diagnosis not present

## 2021-02-20 DIAGNOSIS — E113293 Type 2 diabetes mellitus with mild nonproliferative diabetic retinopathy without macular edema, bilateral: Secondary | ICD-10-CM | POA: Insufficient documentation

## 2021-02-20 DIAGNOSIS — M8580 Other specified disorders of bone density and structure, unspecified site: Secondary | ICD-10-CM | POA: Insufficient documentation

## 2021-02-20 NOTE — Progress Notes (Signed)
CPE  Assessment:   Annual wellness exam 1 year  Hyperlipidemia associated with T2DM ([place]) Was rosuvastatin 5 mg three days a week- hx of LFT elevation with atorvastatin - holding per oncology instructions while doing chemotherapy - ask if can restart LDL goal is <70 Continue low cholesterol diet and exercise.  Check lipid panel.   Diabetes with diabetic chronic kidney disease Currently with slow weight loss and A1Cs in prediabetic range; off metformin, on PRN low dose glimepiride -take only if fasting 150+, needs rarely  Continue diet and exercise.  Perform daily foot/skin check, notify office of any concerning changes.  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 Hollie Salk Off of metformin Increase fluids, avoid NSAIDS, monitor sugars, will monitor  Diabetic neuropathy, painful (HCC)/ chemotherapy induced neuropathy (HCC) Check feet daily, gabapentin 100-300 mg PRN at night.   Diabetic retinopathy, bil (HCC) Control sugars, ophth following  Malignant neoplasm of upper-inner quadrant of left breast in female, estrogen receptor positive ([place]) Being monitored by annual mammogram and oncology  Chemotherapy-induced peripheral neuropathy ([place]) Monitor feet daily may refer to podiatry in the future, declines referral at this time  Ovarian cancer [facility]) Chemotherapy followed by Dr. [name], has port, S/p resection 10/2020, continues on oral chemo, plan for 1-2 years Has routine CBC/CMP by oncology   Pancytopenia (HCC)/anemia in CKD/anemia in neoplastic disease Hem/onc following closely; Check CBC  Gout Continue allopurinol Diet discussed Check uric acid   GERD  Hx of; recently well controlled; discussed PPI and risks with long term use; will attempt slow taper with H2i if needed Discussed diet, avoiding triggers and other lifestyle changes  Vit D def Continue to recommend supplementation for goal of 60-100 Check vitamin D level  Orders Placed This  Encounter  Procedures  . CBC with Differential/Platelet  . COMPLETE METABOLIC PANEL WITH GFR  . Magnesium  . Lipid panel  . TSH  . Hemoglobin A1c  . VITAMIN D 25 Hydroxy (Vit-D Deficiency, Fractures)  . Microalbumin / creatinine urine ratio  . Urinalysis, Routine w reflex microscopic  . Uric acid  . EKG 12-Lead  . HM DIABETES FOOT EXAM   Over 30 minutes of exam, counseling, chart review, and critical decision making was performed Future Appointments  Date Time Provider [place]  02/21/2021  2:45 PM Heath Lark, MD [facility] None  03/04/2021  9:15 AM [facility] LAB [facility] None  03/04/2021  9:30 AM [facility] [place] FLUSH [facility] None  03/04/2021 10:00 AM Heath Lark, MD [facility] None  03/20/2021  3:50 PM [facility] DIAG TOMO 2 [facility] CE  06/11/2021  9:30 AM [name], MD [facility] None  02/21/2022 10:00 AM Liane Comber, NP [facility] None    Plan:   During the course of the visit the patient was educated and counseled about appropriate screening and preventive services including:    Pneumococcal vaccine   Influenza vaccine  Td vaccine  Prevnar 13  Screening electrocardiogram  Screening mammography  Bone densitometry screening  Colorectal cancer screening  Diabetes screening  Glaucoma screening  Nutrition counseling   Advanced directives: given info/requested copies    Subjective:   Kayla Baker is a 85 y.o. female who presents for CPE. She has Essential hypertension; Hyperlipidemia associated with type 2 diabetes mellitus ([place]); T2_NIDDM w/CKD3 ([place]); Vitamin D deficiency; Medication management; Diabetic neuropathy, painful ([place]); Gout; Breast cancer of upper-inner quadrant of left female breast ([place]); Chemotherapy-induced peripheral neuropathy ([place]); Port catheter in place; CKD stage 3 due to type  2 diabetes mellitus ([place]); Incontinence of urine in female; Bilateral leg edema; Ovarian cancer ([place]); Goals of care,  counseling/discussion; Anemia in neoplastic disease; Pancytopenia, acquired ([place]); Cancer associated pain; Protein-calorie malnutrition, moderate ([place]); Genetic testing; Deficiency anemia; Atypical chest pain; Mild nonproliferative diabetic retinopathy of both eyes without macular edema associated with type 2 diabetes mellitus ([place]); and Osteopenia on their problem list.  She is married, no children. 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 She enjoys spending time with family. Once pandemic is over would like to get back into bowling and golfing.   Earlier this year she was unfortunately diagnosed with large R sided ovarian tumor with consequent RLE edema, urinary incontinence (wearing depends and on myrbetriq with benefit), had port cath placed and started chemotherapy managed by Dr. Heath Lark. Initially felt abdominal distention with persistent carcinomatosis, malignant ascites and underwent urgent paracentesis with 2.6 L removed 05/09/2020 without recurrence. Patient has tolerated chemotherapy since excepting some pancytopenia/anemia that have required 2 transfusions and delayed some treatments; she underwent CT in 09/19/2020 which showed tumor mass reduced by over 50%, had resection by Dr. Everitt Amber at [facility] on 10/18/2020. She is now on lynparza 100 mg BID and doing well.   Other significant current problems include hx/o Triple (+) Lt Breast Ca w/ Partial Lt Mastectomy  in April 2017 followed by Chemo tx 5-07/2016 then Radiation in Oct; she completed 4 years of anastrozole, was d/c'd due to ovarian cancer. Last mammogram 03/2020, has scheduled 03/20/2021.   [name]   BMI is Body mass index is 25.11 kg/m., she has been working on diet, pushing fruits/vegetables, watching white carbs; she does have [employer] membership but hasn't been going since the pandemic.  She is very active with housework and yardwork.  Wt  Readings from Last 3 Encounters:  02/21/21 144 lb (65.3 kg)  01/30/21 142 lb (64.4 kg)  01/02/21 134 lb 9.6 oz (61.1 kg)   Her blood pressure has been controlled at home, today their BP is BP: 118/70 She does not workout. She denies chest pain, shortness of breath, dizziness.   She is on cholesterol medication (on zetia, hx of LFT elevation with statin, was on rosuvastatin 5 mg three days a week but holding while on chemotherapy per oncology) and denies myalgias. Her cholesterol is at goal. The cholesterol last visit was:   Lab Results  Component Value Date   CHOL 274 (H) 10/03/2020   HDL 37 (L) 10/03/2020   LDLCALC 181 (H) 10/03/2020   TRIG 332 (H) 10/03/2020   CHOLHDL 7.4 (H) 10/03/2020   She has been working on diet and exercise for T2 diabetes (off of metformin due to CKD, glimepiride 1 mg PRN AM if fasting 150+), and denies foot ulcerations, hyperglycemia, hypoglycemia , increased appetite, nausea, polydipsia, polyuria, visual disturbances, vomiting and weight loss.She reports that her blood sugars have been running 91-121. She does have some mild neuropathy in her feet, on gabapentin.    Last A1C in the office was:  Lab Results  Component Value Date   HGBA1C 5.7 (H) 10/03/2020   CKD IIIb associated with T2DM; followed by Dr. Hollie Salk; last GFR:  Lab Results  Component Value Date   GFRNONAA 36 (L) 02/21/2021   GFRNONAA 32 (L) 02/14/2021   GFRNONAA 28 (L) 02/07/2021   Patient is on Vitamin D supplement, taking 5000 IU daily. Lab Results  Component Value Date   VD25OH 71  10/03/2020     Patient is on allopurinol for gout and does not report a recent flare.  Lab Results  Component Value Date   LABURIC 4.9 05/09/2020   Hem/onc and nephrology also following chronic anemia closely; had normal iron 10/2020, B12 05/2020.  CBC Latest Ref Rng & Units 02/21/2021 02/14/2021 02/07/2021  WBC 4.0 - 10.5 K/uL 3.9(L) 3.3(L) 4.0  Hemoglobin 12.0 - 15.0 g/dL 9.5(L) 9.2(L) 9.9(L)  Hematocrit  36.0 - 46.0 % 28.7(L) 28.0(L) 29.9(L)  Platelets 150 - 400 K/uL 126(L) 106(L) 122(L)     Medication Review  Current Outpatient Medications (Endocrine & Metabolic):  .  glimepiride (AMARYL) 1 MG tablet, Take 1 tab by mouth in the morning if fasting is running 150+. Please do not take this medication if you are ill or not eating as this can cause a low blood sugar.  Current Outpatient Medications (Cardiovascular):  .  ezetimibe (ZETIA) 10 MG tablet, Take 10 mg by mouth daily. .  hydrochlorothiazide (HYDRODIURIL) 25 MG tablet, Take  1 tablet  Daily  for BP and Fluid Retention /Ankle Swelling .  metoprolol succinate (TOPROL-XL) 25 MG 24 hr tablet, TAKE 1 TABLET BY MOUTH DAILY FOR BLOOD PRESSURE (Patient taking differently: Take 25 mg by mouth daily.)  Current Outpatient Medications (Respiratory):  .  loratadine (CLARITIN) 10 MG tablet, Take 10 mg by mouth daily as needed for allergies.  Current Outpatient Medications (Analgesics):  .  acetaminophen (TYLENOL) 325 MG tablet, Take 325 mg by mouth every 6 (six) hours as needed for headache. .  allopurinol (ZYLOPRIM) 300 MG tablet, TAKE 1 TABLET BY MOUTH DAILY FOR GOUT PREVENTION  Current Outpatient Medications (Hematological):  .  cyanocobalamin 1000 MCG tablet, Take 1,000 mcg by mouth daily.  Current Outpatient Medications (Other):  .  blood glucose meter kit and supplies KIT, Dispense based on patient and insurance preference. Use to check blood sugar once daily. Dx: E11.22, N18.30 .  Cholecalciferol (VITAMIN D3) 5000 units CAPS, Take 5,000 Units by mouth daily. Marland Kitchen  gabapentin (NEURONTIN) 300 MG capsule, Take 1 capsule in the morning and afternoon and 2 capsules at night .  glucose blood (FREESTYLE TEST STRIPS) test strip, Check Blood Sugar Daily as directed (Dx: E11.29) .  Lancets (FREESTYLE) lancets, CHECK BLOOD SUGAR DAILY AS DIRECTED .  lidocaine-prilocaine (EMLA) cream, Apply to affected area once (Patient taking differently: Apply 1  application topically daily as needed (port access).) .  Magnesium 250 MG TABS, Take 250 mg by mouth every other day. .  olaparib (LYNPARZA) 100 MG tablet, Take 1 tablet (100 mg total) by mouth 2 (two) times daily. Swallow whole. May take with food to decrease nausea and vomiting. Marland Kitchen  omeprazole (PRILOSEC) 20 MG capsule, Take 1 capsule (20 mg total) by mouth daily. .  ondansetron (ZOFRAN) 8 MG tablet, Take 1 tablet (8 mg total) by mouth 2 (two) times daily as needed for refractory nausea / vomiting. Start on day 3 after carboplatin chemo. .  prochlorperazine (COMPAZINE) 10 MG tablet, Take 1 tablet (10 mg total) by mouth every 6 (six) hours as needed for nausea or vomiting. .  vitamin C (ASCORBIC ACID) 500 MG tablet, Take 500 mg by mouth every evening.   Current Problems (verified) Patient Active Problem List   Diagnosis Date Noted  . Mild nonproliferative diabetic retinopathy of both eyes without macular edema associated with type 2 diabetes mellitus ([place]) 02/20/2021  . Osteopenia 02/20/2021  . Atypical chest pain 12/12/2020  . Deficiency anemia 11/02/2020  .  Genetic testing 09/14/2020  . Protein-calorie malnutrition, moderate ([place]) 06/05/2020  . Pancytopenia, acquired ([place]) 05/15/2020  . Cancer associated pain 05/15/2020  . Anemia in neoplastic disease 05/03/2020  . Goals of care, counseling/discussion 04/20/2020  . Ovarian cancer ([place]) 04/19/2020  . Bilateral leg edema 04/05/2020  . Incontinence of urine in female 04/04/2020  . CKD stage 3 due to type 2 diabetes mellitus ([place]) 07/05/2018  . Port catheter in place 09/18/2016  . Chemotherapy-induced peripheral neuropathy ([place]) 08/07/2016  . Breast cancer of upper-inner quadrant of left female breast ([place]) 02/29/2016  . Gout 12/08/2014  . Diabetic neuropathy, painful ([place]) 08/28/2014  . Medication management 08/27/2014  . Essential hypertension 11/10/2013  . Hyperlipidemia associated with type 2 diabetes mellitus ([place]) 11/10/2013  .  T2_NIDDM w/CKD3 ([place]) 11/10/2013  . Vitamin D deficiency 11/10/2013    Screening Tests Immunization History  Administered Date(s) Administered  . DTaP 07/01/2004  . Influenza Whole 08/15/2013  . Influenza, High Dose Seasonal PF 08/28/2014, 09/20/2015, 08/19/2017, 09/01/2018, 07/28/2019  . Influenza-Unspecified 08/04/2020  . PFIZER(Purple Top)SARS-COV-2 Vaccination 12/12/2019, 12/31/2019, 08/03/2020  . Pneumococcal Conjugate-13 09/20/2015  . Pneumococcal Polysaccharide-23 11/01/2009  . Td 08/10/2009, 04/11/2020  . Zoster 08/02/2011    Preventative care: Last colonoscopy: 2019 will not need another Last mammogram: 03/2020, has scheduled  Last pap smear/pelvic exam: remotely DEXA: 05/2019 - 1.6 osteopenia, plant to get in 2023 with mammogram   Prior vaccinations: TD or Tdap: 03/2020 Influenza: 08/2020  Pneumococcal: 2010 Prevnar13: 2016  Shingles/Zostavax: 2012 discussed will get eventually COVID complete 2021  Names of Other Physician/Practitioners you currently use: 1. [facility]- here for primary care 2. Dr. Nicki Reaper, eye doctor, last visit 08/30/2020 - bil mild retinopathy  3. Dr. Jolayne Panther, dentist, last visit 2019  Patient Care Team: [name], MD as PCP - Kinnie Scales, [place] as Referring Physician (Optometry) [name], MD as Consulting Physician (Dermatology) Latanya Maudlin, MD as Consulting Physician (Orthopedic Surgery) Inda Castle, MD (Inactive) as Consulting Physician (Gastroenterology) Awanda Mink Craige Cotta, RN as Oncology Nurse Navigator (Oncology) Heath Lark, MD as Consulting Physician (Hematology and Oncology)  Allergies Allergies  Allergen Reactions  . Keflex [Cephalexin] Swelling    Tongue and throat swells  . Meloxicam Swelling  . Prednisone Swelling and Other (See Comments)    Can Not take High doses Cannot tolerate high doses Can Not take High doses  . Premarin [Estrogens Conjugated] Hives  . Trovan  [Alatrofloxacin] Other (See Comments)    Unknown reaction    SURGICAL HISTORY She  has a past surgical history that includes Cataract extraction, bilateral (2014); Abdominal hysterectomy (1973); Incontinence surgery (2006); Knee arthroscopy (Right); Hand surgery (Left, 2012); Colonoscopy; Radioactive seed guided mastectomy with axillary sentinel lymph node biopsy (Left, 03/18/2016); Cardiac catheterization; Portacath placement (N/A, 04/14/2016); Port-a-cath removal (N/A, 11/27/2016); Mastopexy (Right, 06/16/2017); Breast biopsy (Left, 02/26/2016); Breast lumpectomy (Left, 03/18/2016); Reduction mammaplasty (Right, 2018); IR IMAGING GUIDED PORT INSERTION (04/24/2020); IR Paracentesis (05/08/2020); IR Paracentesis (05/16/2020); and IR Paracentesis (05/25/2020).   FAMILY HISTORY Her family history includes Alzheimer's disease in her father; Breast cancer (age of onset: 38) in her sister; Cirrhosis in her sister; Diabetes in her father; Epilepsy in some other family members; Esophageal cancer (age of onset: 69) in her maternal aunt; Heart Problems in her maternal grandmother; Heart attack (age of onset: 73) in her maternal uncle; Hypertension in her sister; Lung cancer in an other family member; Lung cancer (age of onset: 58) in her sister; Other in her mother  and sister; Other (age of onset: 80) in an other family member; Pancreatic cancer (age of onset: 59) in her cousin; Parkinson's disease in her maternal aunt; Stroke in her maternal grandmother; Stroke (age of onset: 71) in her mother.   SOCIAL HISTORY She  reports that she has never smoked. She has never used smokeless tobacco. She reports that she does not drink alcohol and does not use drugs.   Review of Systems  Constitutional: Negative for malaise/fatigue and weight loss.  HENT: Negative for hearing loss and tinnitus.   Eyes: Negative for blurred vision and double vision.  Respiratory: Negative for cough, sputum production, shortness of breath and  wheezing.   Cardiovascular: Positive for leg swelling (RLE persistent preceding pelvic cancer). Negative for chest pain, palpitations, orthopnea, claudication and PND.  Gastrointestinal: Positive for abdominal pain (intermittent brief RLQ with movement and deep palpation). Negative for blood in stool, constipation, diarrhea, heartburn, melena, nausea and vomiting.  Genitourinary: Negative.   Musculoskeletal: Negative for falls, joint pain and myalgias.  Skin: Negative for rash.  Neurological: Positive for sensory change (bil feet/ankles, chronic following chemo). Negative for dizziness, tingling, weakness and headaches.  Endo/Heme/Allergies: Negative for polydipsia.  Psychiatric/Behavioral: Negative.  Negative for depression, memory loss, substance abuse and suicidal ideas. The patient is not nervous/anxious and does not have insomnia.   All other systems reviewed and are negative.    Objective:   Today's Vitals   02/21/21 0859  BP: 118/70  Pulse: 73  Temp: (!) 96.4 F (35.8 C)  SpO2: 94%  Weight: 144 lb (65.3 kg)  Height: 5' 3.5" (1.613 m)   Body mass index is 25.11 kg/m.  General appearance: alert, no distress, WD/WN, elder female HEENT: normocephalic, sclerae anicteric, TMs pearly, nares patent, no discharge or erythema, pharynx normal Oral cavity: MMM, no lesions Neck: supple, no lymphadenopathy, no thyromegaly, no masses Heart: RRR, normal S1, S2, no murmurs Lungs: CTA bilaterally, no wheezes, rhonchi, or rales Abdomen: +bs, soft, non tender, non distended, no masses, no hepatomegaly, no splenomegaly Musculoskeletal: nontender, no swelling, no obvious deformity Extremities: LLE without edema, RLE with non-pitting edema 2+ throughout (persistent, has had neg DVT), no cyanosis, no clubbing Pulses: 2+ symmetric, upper and lower extremities, normal cap refill Neurological: alert, oriented x 3, CN2-12 intact, strength normal upper extremities and lower extremities, sensation  normal UE, bil LE diminished through feet and ankles to monofilament and light touch, DTRs 2+ throughout, no cerebellar signs, gait slow steady Psychiatric: normal affect, behavior normal, pleasant  Breasts: mammogram scheduled Gyn: defer to GYN/onc Rectal: defer  EKG: NSR, NSCPT  The patient's weight, height, BMI, have been recorded in the chart.  I have made referrals, counseling, and provided education to the patient based on review of the above and I have provided the patient with a written personalized care plan for preventive services.     Izora Ribas, NP   02/21/2021

## 2021-02-21 ENCOUNTER — Other Ambulatory Visit: Payer: Self-pay | Admitting: Hematology and Oncology

## 2021-02-21 ENCOUNTER — Inpatient Hospital Stay: Payer: PPO | Admitting: Hematology and Oncology

## 2021-02-21 ENCOUNTER — Ambulatory Visit (INDEPENDENT_AMBULATORY_CARE_PROVIDER_SITE_OTHER): Payer: PPO | Admitting: Adult Health

## 2021-02-21 ENCOUNTER — Encounter: Payer: Self-pay | Admitting: Hematology and Oncology

## 2021-02-21 ENCOUNTER — Inpatient Hospital Stay: Payer: PPO

## 2021-02-21 ENCOUNTER — Other Ambulatory Visit: Payer: Self-pay

## 2021-02-21 ENCOUNTER — Encounter: Payer: Self-pay | Admitting: Adult Health

## 2021-02-21 VITALS — BP 118/70 | HR 73 | Temp 96.4°F | Ht 63.5 in | Wt 144.0 lb

## 2021-02-21 DIAGNOSIS — C50212 Malignant neoplasm of upper-inner quadrant of left female breast: Secondary | ICD-10-CM

## 2021-02-21 DIAGNOSIS — C569 Malignant neoplasm of unspecified ovary: Secondary | ICD-10-CM | POA: Diagnosis not present

## 2021-02-21 DIAGNOSIS — E113293 Type 2 diabetes mellitus with mild nonproliferative diabetic retinopathy without macular edema, bilateral: Secondary | ICD-10-CM

## 2021-02-21 DIAGNOSIS — T451X5A Adverse effect of antineoplastic and immunosuppressive drugs, initial encounter: Secondary | ICD-10-CM

## 2021-02-21 DIAGNOSIS — E785 Hyperlipidemia, unspecified: Secondary | ICD-10-CM

## 2021-02-21 DIAGNOSIS — Z0001 Encounter for general adult medical examination with abnormal findings: Secondary | ICD-10-CM

## 2021-02-21 DIAGNOSIS — E440 Moderate protein-calorie malnutrition: Secondary | ICD-10-CM | POA: Diagnosis not present

## 2021-02-21 DIAGNOSIS — E1169 Type 2 diabetes mellitus with other specified complication: Secondary | ICD-10-CM | POA: Diagnosis not present

## 2021-02-21 DIAGNOSIS — Z170 Estrogen receptor positive status [ER+]: Secondary | ICD-10-CM

## 2021-02-21 DIAGNOSIS — E1122 Type 2 diabetes mellitus with diabetic chronic kidney disease: Secondary | ICD-10-CM | POA: Diagnosis not present

## 2021-02-21 DIAGNOSIS — D61818 Other pancytopenia: Secondary | ICD-10-CM | POA: Diagnosis not present

## 2021-02-21 DIAGNOSIS — G620 Drug-induced polyneuropathy: Secondary | ICD-10-CM

## 2021-02-21 DIAGNOSIS — E559 Vitamin D deficiency, unspecified: Secondary | ICD-10-CM

## 2021-02-21 DIAGNOSIS — D630 Anemia in neoplastic disease: Secondary | ICD-10-CM | POA: Diagnosis not present

## 2021-02-21 DIAGNOSIS — Z136 Encounter for screening for cardiovascular disorders: Secondary | ICD-10-CM

## 2021-02-21 DIAGNOSIS — I10 Essential (primary) hypertension: Secondary | ICD-10-CM

## 2021-02-21 DIAGNOSIS — Z0000 Encounter for general adult medical examination without abnormal findings: Secondary | ICD-10-CM | POA: Diagnosis not present

## 2021-02-21 DIAGNOSIS — M1000 Idiopathic gout, unspecified site: Secondary | ICD-10-CM | POA: Diagnosis not present

## 2021-02-21 DIAGNOSIS — Z79899 Other long term (current) drug therapy: Secondary | ICD-10-CM | POA: Diagnosis not present

## 2021-02-21 DIAGNOSIS — C561 Malignant neoplasm of right ovary: Secondary | ICD-10-CM | POA: Diagnosis not present

## 2021-02-21 DIAGNOSIS — N183 Chronic kidney disease, stage 3 unspecified: Secondary | ICD-10-CM

## 2021-02-21 DIAGNOSIS — Z95828 Presence of other vascular implants and grafts: Secondary | ICD-10-CM

## 2021-02-21 DIAGNOSIS — E1140 Type 2 diabetes mellitus with diabetic neuropathy, unspecified: Secondary | ICD-10-CM

## 2021-02-21 DIAGNOSIS — M8580 Other specified disorders of bone density and structure, unspecified site: Secondary | ICD-10-CM

## 2021-02-21 DIAGNOSIS — R600 Localized edema: Secondary | ICD-10-CM

## 2021-02-21 LAB — CMP (CANCER CENTER ONLY)
ALT: 16 U/L (ref 0–44)
AST: 28 U/L (ref 15–41)
Albumin: 3.7 g/dL (ref 3.5–5.0)
Alkaline Phosphatase: 111 U/L (ref 38–126)
Anion gap: 11 (ref 5–15)
BUN: 19 mg/dL (ref 8–23)
CO2: 25 mmol/L (ref 22–32)
Calcium: 9.7 mg/dL (ref 8.9–10.3)
Chloride: 108 mmol/L (ref 98–111)
Creatinine: 1.45 mg/dL — ABNORMAL HIGH (ref 0.44–1.00)
GFR, Estimated: 36 mL/min — ABNORMAL LOW (ref >60)
Glucose, Bld: 107 mg/dL — ABNORMAL HIGH (ref 70–99)
Potassium: 3.7 mmol/L (ref 3.5–5.1)
Sodium: 144 mmol/L (ref 135–145)
Total Bilirubin: 0.6 mg/dL (ref 0.3–1.2)
Total Protein: 6.8 g/dL (ref 6.5–8.1)

## 2021-02-21 LAB — CBC WITH DIFFERENTIAL (CANCER CENTER ONLY)
Abs Immature Granulocytes: 0.03 10*3/uL (ref 0.00–0.07)
Basophils Absolute: 0 10*3/uL (ref 0.0–0.1)
Basophils Relative: 1 %
Eosinophils Absolute: 0.1 10*3/uL (ref 0.0–0.5)
Eosinophils Relative: 2 %
HCT: 28.7 % — ABNORMAL LOW (ref 36.0–46.0)
Hemoglobin: 9.5 g/dL — ABNORMAL LOW (ref 12.0–15.0)
Immature Granulocytes: 1 %
Lymphocytes Relative: 30 %
Lymphs Abs: 1.2 10*3/uL (ref 0.7–4.0)
MCH: 31.5 pg (ref 26.0–34.0)
MCHC: 33.1 g/dL (ref 30.0–36.0)
MCV: 95 fL (ref 80.0–100.0)
Monocytes Absolute: 0.5 10*3/uL (ref 0.1–1.0)
Monocytes Relative: 12 %
Neutro Abs: 2.1 10*3/uL (ref 1.7–7.7)
Neutrophils Relative %: 54 %
Platelet Count: 126 10*3/uL — ABNORMAL LOW (ref 150–400)
RBC: 3.02 MIL/uL — ABNORMAL LOW (ref 3.87–5.11)
RDW: 18.2 % — ABNORMAL HIGH (ref 11.5–15.5)
WBC Count: 3.9 10*3/uL — ABNORMAL LOW (ref 4.0–10.5)
nRBC: 0 % (ref 0.0–0.2)

## 2021-02-21 MED ORDER — OLAPARIB 100 MG PO TABS
100.0000 mg | ORAL_TABLET | Freq: Two times a day (BID) | ORAL | 11 refills | Status: DC
Start: 2021-02-21 — End: 2021-05-14

## 2021-02-21 MED ORDER — HEPARIN SOD (PORK) LOCK FLUSH 100 UNIT/ML IV SOLN
500.0000 [IU] | Freq: Once | INTRAVENOUS | Status: AC
  Administered 2021-02-21: 500 [IU]
  Filled 2021-02-21: qty 5

## 2021-02-21 MED ORDER — SODIUM CHLORIDE 0.9% FLUSH
10.0000 mL | Freq: Once | INTRAVENOUS | Status: AC
  Administered 2021-02-21: 10 mL
  Filled 2021-02-21: qty 10

## 2021-02-21 NOTE — Assessment & Plan Note (Signed)
Pancytopenia has improved with reduced dose treatment She is not symptomatic Observe closely for now

## 2021-02-21 NOTE — Progress Notes (Signed)
HEMATOLOGY-ONCOLOGY ELECTRONIC VISIT PROGRESS NOTE  Patient Care Team: [name], MD as PCP - Kinnie Scales, OD as Referring Physician (Optometry) [name], MD as Consulting Physician (Dermatology) Latanya Maudlin, MD as Consulting Physician (Orthopedic Surgery) Inda Castle, MD (Inactive) as Consulting Physician (Gastroenterology) Jacqulyn Liner, RN as Oncology Nurse Navigator (Oncology) Heath Lark, MD as Consulting Physician (Hematology and Oncology)  I connected with by telephone call  ASSESSMENT & PLAN:  Ovarian cancer [facility]) She tolerated reduced dose Lynparza better Her pancytopenia is improving and her renal function improves She will continue at reduced dose Lynparza at 100 mg twice daily I will see her back on April 4 for further review and physical examination I plan to repeat CT imaging around June of this year  Pancytopenia, acquired ([name]) Pancytopenia has improved with reduced dose treatment She is not symptomatic Observe closely for now  CKD stage 3 due to type 2 diabetes mellitus ([place]) Her renal function is improved She will continue risk factor modifications  Hyperlipidemia associated with type 2 diabetes mellitus ([place]) There is no contraindication for her to proceed with Ezetimibe and Crestor I recommend she resume treatment as directed by her primary care doctor   No orders of the defined types were placed in this encounter.   INTERVAL HISTORY: Please see below for problem oriented charting. The purpose of today's visit is to review blood work and toxicity while on treatment Since her last virtual visit, the dose of [place] was reduced to 100 mg twice a day She denies side effects with treatment so far She is inquiring whether she can resume medications for high cholesterol that was stopped when she was undergoing chemotherapy  SUMMARY OF ONCOLOGIC HISTORY: Oncology History Overview Note  HRD positive   Breast cancer of  upper-inner quadrant of left female breast ([place])  02/26/2016 Initial Diagnosis   Left breast biopsy 11:00 position: invasive ductal carcinoma with DCIS, ER 90%, PR 10%, HER-2 negative, Ki-67 30%, grade 2, 2.2 cm palpable lesion T2 N0 stage II a clinical stage   03/18/2016 Surgery   Left lumpectomy: Invasive ductal carcinoma, grade 2, 6.3 cm, with high-grade DCIS, margins negative, 0/4 lymph nodes negative, ER 90%, via 10%, HER-2 negative ratio 0.97, Ki-67 30%, T3 N0 stage IIB   03/25/2016 Procedure   Genetic testing is negative for pathogenic mutations within any of the 20 Genes on the breast/ovarian cancer panel   04/04/2016 Oncotype testing   Oncotype DX recurrence score 37, 25% 10 year distant risk of recurrence   04/17/2016 - 07/31/2016 Chemotherapy   Adjuvant chemotherapy with dose dense Adriamycin and Cytoxan followed by Abraxane weekly 8 ( discontinued due to neuropathy)   09/01/2016 - 09/26/2016 Radiation Therapy   Adj XRT 1) Left breast: 42.5 Gy in 17 fractions. 2) Left breast boost: 7.5 Gy in 3 fractions.   12/02/2016 -  Anti-estrogen oral therapy   Anastrozole 1 mg daily   Ovarian cancer ([place])  04/03/2020 Imaging   US pelvis Complex cystic and solid mass in LEFT adnexa 12.5 cm diameter question cystic ovarian neoplasm; recommend correlation with serum tumor markers and further evaluation by MR imaging with and without contrast.     04/04/2020 Imaging   US venous Doppler No evidence of deep venous thrombosis in the right lower extremity. Left common femoral vein also patent.   04/15/2020 Imaging   MRI pelvis 1. Large complex solid and cystic mass arising from the right adnexa measuring 10.8 by 11.5 by 11.1 cm. This has  an aggressive appearance with extensive enhancing mural soft tissue components. Findings are highly suspicious for malignant ovarian neoplasm. 2. Extensive bilateral retroperitoneal and bilateral iliac adenopathy compatible with metastatic disease. 3. Signs of  extensive peritoneal carcinomatosis including ascites, enhancement, thickening and nodularity of the peritoneal reflections, omental caking and bulky peritoneal nodularity. 4. Suspected serosal involvement of the dome of bladder with loss of normal fat plane. Cannot rule out mural invasion by tumor.   04/17/2020 Tumor Marker   Patient's tumor was tested for the following markers: CA-125 Results of the tumor marker test revealed 1762.   04/20/2020 Cancer Staging   Staging form: Ovary, Fallopian Tube, and Primary Peritoneal Carcinoma, AJCC 8th Edition - Clinical: FIGO Stage IIIC (cT3, cN1, cM0) - Signed by Heath Lark, MD on 04/20/2020   04/23/2020 Imaging   1. Redemonstrated dominant mixed solid and cystic mass arising from the vicinity of the right ovary measuring at least 11.5 x 10.7 cm, not significantly changed compared to prior MR, and consistent with primary ovarian malignancy.  2. Numerous bulky retroperitoneal, bilateral iliac, and pelvic sidewall lymph nodes. 3. Moderate volume ascites throughout the abdomen and pelvis with subtle thickening and nodularity throughout the peritoneum, and extensive bulky nodular metastatic disease of the omentum. 4. Constellation of findings is consistent with advanced nodal and peritoneal metastatic disease. 5. There are prominent subcentimeter epicardial lymph nodes, nonspecific although suspicious for nodal metastatic disease. No definite nodal metastatic disease in the chest. Attention on follow-up. 6. There are multiple small subpleural nodules at the right lung base overlying the diaphragm, measuring up to 7 mm. These are generally nonspecific and less favored to represent pulmonary metastatic disease given distribution. Attention on follow-up. 7. Somewhat coarse contour of the liver, suggestive of cirrhosis, although out without overt morphologic stigmata. 8. Aortic Atherosclerosis (ICD10-I70.0).     04/24/2020 Procedure   Successful placement of a  right internal jugular approach power injectable Port-A-Cath. The catheter is ready for immediate use.     05/02/2020 Tumor Marker   Patient's tumor was tested for the following markers: CA-125 Results of the tumor marker test revealed 1921   05/03/2020 - 12/12/2020 Chemotherapy   The patient had carboplatin and taxol for chemotherapy treatment.     05/08/2020 Procedure   Successful ultrasound-guided paracentesis yielding 2.6 liters of peritoneal fluid.   05/16/2020 Procedure   Successful ultrasound-guided paracentesis yielding 3.2 liters of peritoneal fluid   05/25/2020 Procedure   Successful ultrasound-guided paracentesis yielding 3 liters of peritoneal fluid.     06/01/2020 Procedure   Successful ultrasound-guided therapeutic paracentesis yielding 1.7 liters of peritoneal fluid.   06/14/2020 Tumor Marker   Patient's tumor was tested for the following markers: CA-125 Results of the tumor marker test revealed 1309   06/20/2020 Imaging   1. Dominant mixed cystic and solid mass in the central pelvis is stable in the interval. 2. Clear interval decrease in retroperitoneal and pelvic sidewall lymphadenopathy. The bulky omental disease has also clearly decreased in the interval. 3. Interval decrease in ascites. 4. Stable 8 mm subpleural nodule along the diaphragm. 5. Aortic Atherosclerosis (ICD10-I70.0).   07/10/2020 Tumor Marker   Patient's tumor was tested for the following markers: CA-125 Results of the tumor marker test revealed 220   08/03/2020 Tumor Marker   Patient's tumor was tested for the following markers: CA-125 Results of the tumor marker test revealed 53.3.   09/03/2020 Tumor Marker   Patient's tumor was tested for the following markers: CA-125 Results of the tumor marker test  revealed 29.7   09/20/2020 Imaging   1. Substantial improvement. The right ovarian cystic and solid mass is half the volume that it measured on 06/20/2020. Similar reduction in the bulk of the omental  caking of tumor. Prior ascites is resolved and the retroperitoneal adenopathy is markedly improved. 2. Other imaging findings of potential clinical significance: Stable pleural-based nodularity along the right hemidiaphragm measuring about 1.1 by 0.7 by 0.3 cm. Stable hypodense lesion of the right kidney upper pole, probably a cyst although the configuration of this lesion makes it difficult to obtain accurate density measurements. Left ovarian cyst, stable. Chondrocalcinosis involving the acetabular labra. Mild lumbar spondylosis and degenerative disc disease. 3. Aortic atherosclerosis.     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 (stage ypT3c), predominantly viable with focal fibrosis and hemosiderin laden macrophages (possible mild treatment effect)  B: Ovary and fallopian tube, right, salpingo-oophorectomy - High grade serous carcinoma involving right ovary and fallopian tube with surface involvement, size up to 8 cm  - Areas of necrosis and hemosiderin laden macrophages suggestive of treatment effect - 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 - See synoptic report and comment  C: Ovary and fallopian tube, left, salpingo-oophorectomy - High grade serous carcinoma involving the left fallopian tube, size up to 0.5 cm - Ovary with no definite involvement by carcinoma identified - Nodular area of endometriosis and peritoneal inclusion cyst also present - See synoptic report and comment   Procedure:    Bilateral salpingo-oophorectomy    Procedure:    Omentectomy    Specimen Integrity of Right Ovary:    Intact with surface involvement by tumor    Specimen Integrity of Left Ovary:    Capsule intact   TUMOR Tumor Site:     Right fallopian tube: favor as primary site; also involves right ovary and left fallopian tube  Histologic Type:    Serous carcinoma  Histologic Grade:    High grade  Tumor Size:    Greatest Dimension (Centimeters): in fallopian tube: 1.0 cm; in ovary: 8.0 cm Ovarian Surface Involvement:    Present    Laterality:    Right  Fallopian Tube Surface Involvement:    Present    Laterality:    Bilateral  Other Tissue / Organ Involvement:    Right ovary  Other Tissue / Organ Involvement:    Right fallopian tube  Other Tissue / Organ Involvement:    Left fallopian tube  Other Tissue / Organ Involvement:    Omentum  Largest Extrapelvic Peritoneal Focus:    Macroscopic (greater than 2 cm)  Peritoneal / Ascitic Fluid:    Not submitted / unknown  Pleural Fluid:    Not submitted / unknown  Treatment Effect:    No definite or minimal response identified (chemotherapy response score [CRS] 1)   LYMPH NODES Regional Lymph Nodes:    No lymph nodes submitted or found   PATHOLOGIC STAGE CLASSIFICATION (pTNM, AJCC 8th Edition) TNM Descriptors:    y (post-treatment)  Primary Tumor (pT):    pT3c  Regional Lymph Nodes (pN):    pNX   FIGO STAGE FIGO Stage:    IIIC   ADDITIONAL FINDINGS Additional Findings:    Serous tubal intraepithelial carcinoma (STIC)  Additional Findings:    Left ovary with no definite carcinoma identified,  left-sided nodule of endometriosis and peritoneal inclusion cyst    10/18/2020 - 10/20/2020 Hospital Admission   She was admitted to [facility] for interval debulking surgery   11/19/2020 Tumor Marker   Patient's tumor was tested for the following markers: CA-125. Results of the tumor marker test revealed 38.3   12/12/2020 Tumor Marker   Patient's tumor was tested for the following markers CA-125. Results of the tumor marker test revealed 42.3.   01/02/2021 Tumor Marker   Patient's tumor was tested for the following markers: CA-125. Results of the tumor marker test revealed  37.3   01/29/2021 Tumor Marker   Patient's tumor was tested for the following markers: CA-125 Results of the tumor marker test revealed 44.3   01/29/2021 Imaging   1. Interval increase in loculated appearing ascites in the low central abdomen and pelvis. 2. There is extensive peritoneal nodularity surrounding this fluid, the nodularity itself not appreciably changed in appearance compared to prior examination. 3. Additional peritoneal nodularity and/or mesenteric lymph nodes are unchanged. 4. Interval decrease in size of a low-attenuation nodule overlying the right hemidiaphragm, now measuring 1.2 cm, previously 1.8 cm when measured similarly. Findings are consistent with treatment response of a metastatic nodule. No other evidence of intrathoracic metastatic disease. 5. Status post hysterectomy, oophorectomy, and omentectomy.   01/31/2021 -  Chemotherapy   She is started on Olaparib         REVIEW OF SYSTEMS:   Constitutional: Denies fevers, chills or abnormal weight loss Eyes: Denies blurriness of vision Ears, nose, mouth, throat, and face: Denies mucositis or sore throat Respiratory: Denies cough, dyspnea or wheezes Cardiovascular: Denies palpitation, chest discomfort Gastrointestinal:  Denies nausea, heartburn or change in bowel habits Skin: Denies abnormal skin rashes Lymphatics: Denies new lymphadenopathy or easy bruising Neurological:Denies numbness, tingling or new weaknesses Behavioral/Psych: Mood is stable, no new changes  Extremities: No lower extremity edema All other systems were reviewed with the patient and are negative.  I have reviewed the past medical history, past surgical history, social history and family history with the patient and they are unchanged from previous note.  ALLERGIES:  is allergic to keflex [cephalexin], meloxicam, prednisone, premarin [estrogens conjugated], and trovan [alatrofloxacin].  MEDICATIONS:  Current Outpatient Medications  Medication  Sig Dispense Refill  . rosuvastatin (CRESTOR) 5 MG tablet Take 5 mg by mouth 3 (three) times a week.    Marland Kitchen acetaminophen (TYLENOL) 325 MG tablet Take 325 mg by mouth every 6 (six) hours as needed for headache.    . allopurinol (ZYLOPRIM) 300 MG tablet TAKE 1 TABLET BY MOUTH DAILY FOR GOUT PREVENTION 90 tablet 1  . blood glucose meter kit and supplies KIT Dispense based on patient and insurance preference. Use to check blood sugar once daily. Dx: E11.22, N18.30 1 each 12  . Cholecalciferol (VITAMIN D3) 5000 units CAPS Take 5,000 Units by mouth daily.    . cyanocobalamin 1000 MCG tablet Take 1,000 mcg by mouth daily.    Marland Kitchen ezetimibe (ZETIA) 10 MG tablet Take 10 mg by mouth daily.    Marland Kitchen gabapentin (NEURONTIN) 300 MG capsule Take 1 capsule in the morning and afternoon and 2 capsules at night 360 capsule 3  . glimepiride (AMARYL) 1 MG tablet Take 1 tab by mouth in the morning if fasting is running 150+. Please do not take this medication if you are ill or not eating as this can cause a low blood sugar. 90 tablet 1  . glucose blood (FREESTYLE TEST STRIPS) test strip  Check Blood Sugar Daily as directed (Dx: E11.29) 100 strip 3  . hydrochlorothiazide (HYDRODIURIL) 25 MG tablet Take  1 tablet  Daily  for BP and Fluid Retention /Ankle Swelling 90 tablet 0  . Lancets (FREESTYLE) lancets CHECK BLOOD SUGAR DAILY AS DIRECTED 100 each 3  . lidocaine-prilocaine (EMLA) cream Apply to affected area once (Patient taking differently: Apply 1 application topically daily as needed (port access).) 30 g 3  . loratadine (CLARITIN) 10 MG tablet Take 10 mg by mouth daily as needed for allergies.    . Magnesium 250 MG TABS Take 250 mg by mouth every other day.    . metoprolol succinate (TOPROL-XL) 25 MG 24 hr tablet TAKE 1 TABLET BY MOUTH DAILY FOR BLOOD PRESSURE (Patient taking differently: Take 25 mg by mouth daily.) 90 tablet 3  . olaparib (LYNPARZA) 100 MG tablet Take 1 tablet (100 mg total) by mouth 2 (two) times daily.  Swallow whole. May take with food to decrease nausea and vomiting. 120 tablet 11  . omeprazole (PRILOSEC) 20 MG capsule Take 1 capsule (20 mg total) by mouth daily. 30 capsule 11  . ondansetron (ZOFRAN) 8 MG tablet Take 1 tablet (8 mg total) by mouth 2 (two) times daily as needed for refractory nausea / vomiting. Start on day 3 after carboplatin chemo. 30 tablet 1  . prochlorperazine (COMPAZINE) 10 MG tablet Take 1 tablet (10 mg total) by mouth every 6 (six) hours as needed for nausea or vomiting. 60 tablet 9  . vitamin C (ASCORBIC ACID) 500 MG tablet Take 500 mg by mouth every evening.      No current facility-administered medications for this visit.    PHYSICAL EXAMINATION: ECOG PERFORMANCE STATUS: 0 - Asymptomatic  LABORATORY DATA:  I have reviewed the data as listed CMP Latest Ref Rng & Units 02/21/2021 02/14/2021 02/07/2021  Glucose 70 - 99 mg/dL 107(H) 162(H) 195(H)  BUN 8 - 23 mg/dL 19 27(H) 25(H)  Creatinine 0.44 - 1.00 mg/dL 1.45(H) 1.60(H) 1.77(H)  Sodium 135 - 145 mmol/L 144 142 143  Potassium 3.5 - 5.1 mmol/L 3.7 3.7 3.6  Chloride 98 - 111 mmol/L 108 110 107  CO2 22 - 32 mmol/L _0 Calcium 8.9 - 10.3 mg/dL 9.7 9.4 9.4  Total Protein 6.5 - 8.1 g/dL 6.8 6.5 6.5  Total Bilirubin 0.3 - 1.2 mg/dL 0.6 0.5 0.6  Alkaline Phos 38 - 126 U/L 111 127(H) 124  AST 15 - 41 U/L _1 ALT 0 - 44 U/L _2 Lab Results  Component Value Date   WBC 3.9 (L) 02/21/2021   HGB 9.5 (L) 02/21/2021   HCT 28.7 (L) 02/21/2021   MCV 95.0 02/21/2021   PLT 126 (L) 02/21/2021   NEUTROABS 2.1 02/21/2021     RADIOGRAPHIC STUDIES: I have personally reviewed the radiological images as listed and agreed with the findings in the report. CT CHEST ABDOMEN PELVIS W CONTRAST  Result Date: 01/29/2021 CLINICAL DATA:  Ovarian cancer, assess treatment response EXAM: CT CHEST, ABDOMEN, AND PELVIS WITH CONTRAST TECHNIQUE: Multidetector CT imaging of the chest, abdomen and pelvis was performed  following the standard protocol during bolus administration of intravenous contrast. CONTRAST:  68m OMNIPAQUE IOHEXOL 300 MG/ML SOLN, additional oral enteric contrast COMPARISON:  11/24/2020 FINDINGS: CT CHEST FINDINGS Cardiovascular: Right chest port catheter. Aortic atherosclerosis. Normal heart size. No pericardial effusion. Mediastinum/Nodes: No enlarged mediastinal, hilar, or axillary lymph nodes. Thyroid gland, trachea, and esophagus demonstrate no significant  findings. Lungs/Pleura: Lungs are clear. Interval decrease in size of a low-attenuation nodule overlying the right hemidiaphragm, now measuring 1.2 cm, previously 1.8 cm when measured similarly (series 4, image 82). No pleural effusion or pneumothorax. Musculoskeletal: No chest wall mass or suspicious bone lesions identified. Evidence of prior left lumpectomy. CT ABDOMEN PELVIS FINDINGS Hepatobiliary: No solid liver abnormality is seen. No gallstones, gallbladder wall thickening, or biliary dilatation. Pancreas: Unremarkable. No pancreatic ductal dilatation or surrounding inflammatory changes. Spleen: Normal in size without significant abnormality. Adrenals/Urinary Tract: Adrenal glands are unremarkable. Kidneys are normal, without renal calculi, solid lesion, or hydronephrosis. Bladder is unremarkable. Stomach/Bowel: Stomach is within normal limits. Appendix appears normal. No evidence of bowel wall thickening, distention, or inflammatory changes. Status post omentectomy. Sigmoid diverticula. Generally large burden of stool throughout the colon and rectum. Vascular/Lymphatic: Aortic atherosclerosis. No enlarged abdominal or pelvic lymph nodes. Reproductive: Status post hysterectomy and oophorectomy. Other: No abdominal wall hernia or abnormality. Interval increase in loculated appearing ascites in the low central abdomen and pelvis (series 2, image 105). There is extensive peritoneal nodularity surrounding this fluid, which is not appreciably changed  in appearance compared to prior examination. Unchanged additional peritoneal nodularity about the right colon, index nodule anterior to the cecum measuring 1.4 x 1.3 cm (series 2, image 75). Unchanged peritoneal nodule or mesocolon lymph node adjacent to the proximal sigmoid measuring 1.2 x 1.0 cm (series 2, image 92). Musculoskeletal: No acute or significant osseous findings. IMPRESSION: 1. Interval increase in loculated appearing ascites in the low central abdomen and pelvis. 2. There is extensive peritoneal nodularity surrounding this fluid, the nodularity itself not appreciably changed in appearance compared to prior examination. 3. Additional peritoneal nodularity and/or mesenteric lymph nodes are unchanged. 4. Interval decrease in size of a low-attenuation nodule overlying the right hemidiaphragm, now measuring 1.2 cm, previously 1.8 cm when measured similarly. Findings are consistent with treatment response of a metastatic nodule. No other evidence of intrathoracic metastatic disease. 5. Status post hysterectomy, oophorectomy, and omentectomy. Aortic Atherosclerosis (ICD10-I70.0). Electronically Signed   By: Eddie Candle M.D.   On: 01/29/2021 19:01   US Venous Img Lower Bilateral (DVT)  Result Date: 02/20/2021 CLINICAL DATA:  Lower extremity edema, primarily of the right lower extremity. EXAM: BILATERAL LOWER EXTREMITY VENOUS DOPPLER ULTRASOUND TECHNIQUE: Gray-scale sonography with graded compression, as well as color Doppler and duplex ultrasound were performed to evaluate the lower extremity deep venous systems from the level of the common femoral vein and including the common femoral, femoral, profunda femoral, popliteal and calf veins including the posterior tibial, peroneal and gastrocnemius veins when visible. The superficial great saphenous vein was also interrogated. Spectral Doppler was utilized to evaluate flow at rest and with distal augmentation maneuvers in the common femoral, femoral and  popliteal veins. COMPARISON:  None. FINDINGS: RIGHT LOWER EXTREMITY Common Femoral Vein: No evidence of thrombus. Normal compressibility, respiratory phasicity and response to augmentation. Saphenofemoral Junction: No evidence of thrombus. Normal compressibility and flow on color Doppler imaging. Profunda Femoral Vein: No evidence of thrombus. Normal compressibility and flow on color Doppler imaging. Femoral Vein: No evidence of thrombus. Normal compressibility, respiratory phasicity and response to augmentation. Popliteal Vein: No evidence of thrombus. Normal compressibility, respiratory phasicity and response to augmentation. Calf Veins: No evidence of thrombus. Normal compressibility and flow on color Doppler imaging. Superficial Great Saphenous Vein: No evidence of thrombus. Normal compressibility. Venous Reflux:  None. Other Findings: No evidence of superficial thrombophlebitis or abnormal fluid collection. LEFT LOWER EXTREMITY Common Femoral  Vein: No evidence of thrombus. Normal compressibility, respiratory phasicity and response to augmentation. Saphenofemoral Junction: No evidence of thrombus. Normal compressibility and flow on color Doppler imaging. Profunda Femoral Vein: No evidence of thrombus. Normal compressibility and flow on color Doppler imaging. Femoral Vein: No evidence of thrombus. Normal compressibility, respiratory phasicity and response to augmentation. Popliteal Vein: No evidence of thrombus. Normal compressibility, respiratory phasicity and response to augmentation. Calf Veins: No evidence of thrombus. Normal compressibility and flow on color Doppler imaging. Superficial Great Saphenous Vein: No evidence of thrombus. Normal compressibility. Venous Reflux:  None. Other Findings: No evidence of superficial thrombophlebitis or abnormal fluid collection. IMPRESSION: No evidence of deep venous thrombosis in either lower extremity. Electronically Signed   By: Aletta Edouard M.D.   On: 02/20/2021  13:27    I discussed the assessment and treatment plan with the patient. The patient was provided an opportunity to ask questions and all were answered. The patient agreed with the plan and demonstrated an understanding of the instructions. The patient was advised to call back or seek an in-person evaluation if the symptoms worsen or if the condition fails to improve as anticipated.    I spent 20 minutes for the appointment reviewing test results, discuss management and coordination of care.  Heath Lark, MD 02/21/2021 1:52 PM

## 2021-02-21 NOTE — Assessment & Plan Note (Signed)
Her renal function is improved She will continue risk factor modifications

## 2021-02-21 NOTE — Assessment & Plan Note (Signed)
She tolerated reduced dose Lynparza better Her pancytopenia is improving and her renal function improves She will continue at reduced dose Lynparza at 100 mg twice daily I will see her back on April 4 for further review and physical examination I plan to repeat CT imaging around June of this year

## 2021-02-21 NOTE — Patient Instructions (Signed)
Implanted [facility] Guide An implanted port is a device that is placed under the skin. It is usually placed in the chest. The device can be used to give IV medicine, to take blood, or for dialysis. You may have an implanted port if:  You need IV medicine that would be irritating to the small veins in your hands or arms.  You need IV medicines, such as antibiotics, for a long period of time.  You need IV nutrition for a long period of time.  You need dialysis. When you have a port, your health care provider can choose to use the port instead of veins in your arms for these procedures. You may have fewer limitations when using a port than you would if you used other types of long-term IVs, and you will likely be able to return to normal activities after your incision heals. An implanted port has two main parts:  Reservoir. The reservoir is the part where a needle is inserted to give medicines or draw blood. The reservoir is round. After it is placed, it appears as a small, raised area under your skin.  Catheter. The catheter is a thin, flexible tube that connects the reservoir to a vein. Medicine that is inserted into the reservoir goes into the catheter and then into the vein. How is my port accessed? To access your port:  A numbing cream may be placed on the skin over the port site.  Your health care provider will put on a mask and sterile gloves.  The skin over your port will be cleaned carefully with a germ-killing soap and allowed to dry.  Your health care provider will gently pinch the port and insert a needle into it.  Your health care provider will check for a blood return to make sure the port is in the vein and is not clogged.  If your port needs to remain accessed to get medicine continuously (constant infusion), your health care provider will place a clear bandage (dressing) over the needle site. The dressing and needle will need to be changed every week, or as told by your  health care provider. What is flushing? Flushing helps keep the port from getting clogged. Follow instructions from your health care provider about how and when to flush the port. Ports are usually flushed with saline solution or a medicine called heparin. The need for flushing will depend on how the port is used:  If the port is only used from time to time to give medicines or draw blood, the port may need to be flushed: ? Before and after medicines have been given. ? Before and after blood has been drawn. ? As part of routine maintenance. Flushing may be recommended every 4-6 weeks.  If a constant infusion is running, the port may not need to be flushed.  Throw away any syringes in a disposal container that is meant for sharp items (sharps container). You can buy a sharps container from a pharmacy, or you can make one by using an empty hard plastic bottle with a cover. How long will my port stay implanted? The port can stay in for as long as your health care provider thinks it is needed. When it is time for the port to come out, a surgery will be done to remove it. The surgery will be similar to the procedure that was done to put the port in. Follow these instructions at home:  Flush your port as told by your health care  provider.  If you need an infusion over several days, follow instructions from your health care provider about how to take care of your port site. Make sure you: ? Wash your hands with soap and water before you change your dressing. If soap and water are not available, use alcohol-based hand sanitizer. ? Change your dressing as told by your health care provider. ? Place any used dressings or infusion bags into a plastic bag. Throw that bag in the trash. ? Keep the dressing that covers the needle clean and dry. Do not get it wet. ? Do not use scissors or sharp objects near the tube. ? Keep the tube clamped, unless it is being used.  Check your port site every day for signs  of infection. Check for: ? Redness, swelling, or pain. ? Fluid or blood. ? Pus or a bad smell.  Protect the skin around the port site. ? Avoid wearing bra straps that rub or irritate the site. ? Protect the skin around your port from seat belts. Place a soft pad over your chest if needed.  Bathe or shower as told by your health care provider. The site may get wet as long as you are not actively receiving an infusion.  Return to your normal activities as told by your health care provider. Ask your health care provider what activities are safe for you.  Carry a medical alert card or wear a medical alert bracelet at all times. This will let health care providers know that you have an implanted port in case of an emergency.   Get help right away if:  You have redness, swelling, or pain at the port site.  You have fluid or blood coming from your port site.  You have pus or a bad smell coming from the port site.  You have a fever. Summary  Implanted ports are usually placed in the chest for long-term IV access.  Follow instructions from your health care provider about flushing the port and changing bandages (dressings).  Take care of the area around your port by avoiding clothing that puts pressure on the area, and by watching for signs of infection.  Protect the skin around your port from seat belts. Place a soft pad over your chest if needed.  Get help right away if you have a fever or you have redness, swelling, pain, drainage, or a bad smell at the port site. This information is not intended to replace advice given to you by your health care provider. Make sure you discuss any questions you have with your health care provider. Document Revised: 04/02/2020 Document Reviewed: 04/02/2020 Elsevier Patient Education  [place].

## 2021-02-21 NOTE — Assessment & Plan Note (Signed)
There is no contraindication for her to proceed with Ezetimibe and Crestor I recommend she resume treatment as directed by her primary care doctor

## 2021-02-22 ENCOUNTER — Other Ambulatory Visit: Payer: Self-pay | Admitting: Adult Health

## 2021-02-22 DIAGNOSIS — R8290 Unspecified abnormal findings in urine: Secondary | ICD-10-CM

## 2021-02-22 LAB — CBC WITH DIFFERENTIAL/PLATELET
Absolute Monocytes: 244 cells/uL (ref 200–950)
Basophils Absolute: 10 cells/uL (ref 0–200)
Basophils Relative: 0.3 %
Eosinophils Absolute: 50 cells/uL (ref 15–500)
Eosinophils Relative: 1.5 %
HCT: 30.7 % — ABNORMAL LOW (ref 35.0–45.0)
Hemoglobin: 9.8 g/dL — ABNORMAL LOW (ref 11.7–15.5)
Lymphs Abs: 1056 cells/uL (ref 850–3900)
MCH: 30.8 pg (ref 27.0–33.0)
MCHC: 31.9 g/dL — ABNORMAL LOW (ref 32.0–36.0)
MCV: 96.5 fL (ref 80.0–100.0)
MPV: 10.8 fL (ref 7.5–12.5)
Monocytes Relative: 7.4 %
Neutro Abs: 1940 cells/uL (ref 1500–7800)
Neutrophils Relative %: 58.8 %
Platelets: 132 10*3/uL — ABNORMAL LOW (ref 140–400)
RBC: 3.18 10*6/uL — ABNORMAL LOW (ref 3.80–5.10)
RDW: 18 % — ABNORMAL HIGH (ref 11.0–15.0)
Total Lymphocyte: 32 %
WBC: 3.3 10*3/uL — ABNORMAL LOW (ref 3.8–10.8)

## 2021-02-22 LAB — URINALYSIS, ROUTINE W REFLEX MICROSCOPIC
Bilirubin Urine: NEGATIVE
Glucose, UA: NEGATIVE
Hgb urine dipstick: NEGATIVE
Hyaline Cast: NONE SEEN /LPF
Ketones, ur: NEGATIVE
Nitrite: POSITIVE — AB
Protein, ur: NEGATIVE
RBC / HPF: NONE SEEN /HPF (ref 0–2)
Specific Gravity, Urine: 1.009 (ref 1.001–1.03)
Squamous Epithelial / HPF: NONE SEEN /HPF (ref <=5)
pH: <=5 (ref 5.0–8.0)

## 2021-02-22 LAB — MAGNESIUM: Magnesium: 1.4 mg/dL — ABNORMAL LOW (ref 1.5–2.5)

## 2021-02-22 LAB — LIPID PANEL
Cholesterol: 197 mg/dL (ref <200)
HDL: 39 mg/dL — ABNORMAL LOW (ref >=50)
LDL Cholesterol (Calc): 121 mg/dL (calc) — ABNORMAL HIGH
Non-HDL Cholesterol (Calc): 158 mg/dL (calc) — ABNORMAL HIGH (ref <130)
Total CHOL/HDL Ratio: 5.1 (calc) — ABNORMAL HIGH (ref <5.0)
Triglycerides: 258 mg/dL — ABNORMAL HIGH (ref <150)

## 2021-02-22 LAB — COMPLETE METABOLIC PANEL WITH GFR
AG Ratio: 1.7 (calc) (ref 1.0–2.5)
ALT: 11 U/L (ref 6–29)
AST: 25 U/L (ref 10–35)
Albumin: 4 g/dL (ref 3.6–5.1)
Alkaline phosphatase (APISO): 114 U/L (ref 37–153)
BUN/Creatinine Ratio: 15 (calc) (ref 6–22)
BUN: 22 mg/dL (ref 7–25)
CO2: 29 mmol/L (ref 20–32)
Calcium: 9.6 mg/dL (ref 8.6–10.4)
Chloride: 105 mmol/L (ref 98–110)
Creat: 1.42 mg/dL — ABNORMAL HIGH (ref 0.60–0.88)
GFR, Est African American: 40 mL/min/{1.73_m2} — ABNORMAL LOW (ref >=60)
GFR, Est Non African American: 34 mL/min/{1.73_m2} — ABNORMAL LOW (ref >=60)
Globulin: 2.3 g/dL (calc) (ref 1.9–3.7)
Glucose, Bld: 209 mg/dL — ABNORMAL HIGH (ref 65–139)
Potassium: 4 mmol/L (ref 3.5–5.3)
Sodium: 143 mmol/L (ref 135–146)
Total Bilirubin: 0.5 mg/dL (ref 0.2–1.2)
Total Protein: 6.3 g/dL (ref 6.1–8.1)

## 2021-02-22 LAB — URIC ACID: Uric Acid, Serum: 5 mg/dL (ref 2.5–7.0)

## 2021-02-22 LAB — MICROALBUMIN / CREATININE URINE RATIO
Creatinine, Urine: 61 mg/dL (ref 20–275)
Microalb Creat Ratio: 13 mcg/mg creat (ref <30)
Microalb, Ur: 0.8 mg/dL

## 2021-02-22 LAB — HEMOGLOBIN A1C
Hgb A1c MFr Bld: 6.5 % of total Hgb — ABNORMAL HIGH (ref <5.7)
Mean Plasma Glucose: 140 mg/dL
eAG (mmol/L): 7.7 mmol/L

## 2021-02-22 LAB — TSH: TSH: 3.89 mIU/L (ref 0.40–4.50)

## 2021-02-22 LAB — VITAMIN D 25 HYDROXY (VIT D DEFICIENCY, FRACTURES): Vit D, 25-Hydroxy: 50 ng/mL (ref 30–100)

## 2021-02-22 MED ORDER — ROSUVASTATIN CALCIUM 5 MG PO TABS: 5.0000 mg | ORAL_TABLET | ORAL | 3 refills | Status: DC

## 2021-02-24 LAB — URINE CULTURE
MICRO NUMBER:: 11694264
SPECIMEN QUALITY:: ADEQUATE

## 2021-02-25 ENCOUNTER — Other Ambulatory Visit: Payer: Self-pay | Admitting: Adult Health

## 2021-02-25 MED ORDER — SULFAMETHOXAZOLE-TRIMETHOPRIM 800-160 MG PO TABS: 1.0000 | ORAL_TABLET | Freq: Two times a day (BID) | ORAL | 0 refills | Status: DC

## 2021-02-28 ENCOUNTER — Other Ambulatory Visit: Payer: Self-pay | Admitting: Adult Health

## 2021-02-28 MED ORDER — KETOCONAZOLE 2 % EX CREA: 1.0000 "application " | TOPICAL_CREAM | Freq: Two times a day (BID) | CUTANEOUS | 0 refills | Status: DC

## 2021-03-04 ENCOUNTER — Inpatient Hospital Stay: Payer: PPO

## 2021-03-04 ENCOUNTER — Inpatient Hospital Stay: Payer: PPO | Attending: Gynecologic Oncology | Admitting: Hematology and Oncology

## 2021-03-04 ENCOUNTER — Telehealth: Payer: Self-pay | Admitting: Hematology and Oncology

## 2021-03-04 ENCOUNTER — Other Ambulatory Visit: Payer: Self-pay

## 2021-03-04 ENCOUNTER — Telehealth: Payer: Self-pay

## 2021-03-04 ENCOUNTER — Encounter: Payer: Self-pay | Admitting: Hematology and Oncology

## 2021-03-04 DIAGNOSIS — C786 Secondary malignant neoplasm of retroperitoneum and peritoneum: Secondary | ICD-10-CM | POA: Insufficient documentation

## 2021-03-04 DIAGNOSIS — C50212 Malignant neoplasm of upper-inner quadrant of left female breast: Secondary | ICD-10-CM

## 2021-03-04 DIAGNOSIS — C569 Malignant neoplasm of unspecified ovary: Secondary | ICD-10-CM | POA: Diagnosis not present

## 2021-03-04 DIAGNOSIS — Z170 Estrogen receptor positive status [ER+]: Secondary | ICD-10-CM

## 2021-03-04 DIAGNOSIS — E1122 Type 2 diabetes mellitus with diabetic chronic kidney disease: Secondary | ICD-10-CM | POA: Diagnosis not present

## 2021-03-04 DIAGNOSIS — Z7984 Long term (current) use of oral hypoglycemic drugs: Secondary | ICD-10-CM | POA: Diagnosis not present

## 2021-03-04 DIAGNOSIS — Z79899 Other long term (current) drug therapy: Secondary | ICD-10-CM | POA: Diagnosis not present

## 2021-03-04 DIAGNOSIS — C561 Malignant neoplasm of right ovary: Secondary | ICD-10-CM | POA: Diagnosis not present

## 2021-03-04 DIAGNOSIS — N183 Chronic kidney disease, stage 3 unspecified: Secondary | ICD-10-CM | POA: Insufficient documentation

## 2021-03-04 DIAGNOSIS — Z9221 Personal history of antineoplastic chemotherapy: Secondary | ICD-10-CM | POA: Diagnosis not present

## 2021-03-04 DIAGNOSIS — Z923 Personal history of irradiation: Secondary | ICD-10-CM | POA: Insufficient documentation

## 2021-03-04 DIAGNOSIS — D61818 Other pancytopenia: Secondary | ICD-10-CM | POA: Diagnosis not present

## 2021-03-04 DIAGNOSIS — Z95828 Presence of other vascular implants and grafts: Secondary | ICD-10-CM

## 2021-03-04 DIAGNOSIS — Z90722 Acquired absence of ovaries, bilateral: Secondary | ICD-10-CM | POA: Diagnosis not present

## 2021-03-04 DIAGNOSIS — Z79811 Long term (current) use of aromatase inhibitors: Secondary | ICD-10-CM | POA: Diagnosis not present

## 2021-03-04 DIAGNOSIS — Z90710 Acquired absence of both cervix and uterus: Secondary | ICD-10-CM | POA: Diagnosis not present

## 2021-03-04 DIAGNOSIS — Z452 Encounter for adjustment and management of vascular access device: Secondary | ICD-10-CM | POA: Diagnosis not present

## 2021-03-04 DIAGNOSIS — Z9079 Acquired absence of other genital organ(s): Secondary | ICD-10-CM | POA: Diagnosis not present

## 2021-03-04 LAB — CMP (CANCER CENTER ONLY)
ALT: 25 U/L (ref 0–44)
AST: 42 U/L — ABNORMAL HIGH (ref 15–41)
Albumin: 4 g/dL (ref 3.5–5.0)
Alkaline Phosphatase: 124 U/L (ref 38–126)
Anion gap: 12 (ref 5–15)
BUN: 25 mg/dL — ABNORMAL HIGH (ref 8–23)
CO2: 22 mmol/L (ref 22–32)
Calcium: 10.1 mg/dL (ref 8.9–10.3)
Chloride: 109 mmol/L (ref 98–111)
Creatinine: 1.86 mg/dL — ABNORMAL HIGH (ref 0.44–1.00)
GFR, Estimated: 27 mL/min — ABNORMAL LOW (ref >60)
Glucose, Bld: 87 mg/dL (ref 70–99)
Potassium: 4 mmol/L (ref 3.5–5.1)
Sodium: 143 mmol/L (ref 135–145)
Total Bilirubin: 0.4 mg/dL (ref 0.3–1.2)
Total Protein: 7.2 g/dL (ref 6.5–8.1)

## 2021-03-04 LAB — CBC WITH DIFFERENTIAL (CANCER CENTER ONLY)
Abs Immature Granulocytes: 0.01 10*3/uL (ref 0.00–0.07)
Basophils Absolute: 0 10*3/uL (ref 0.0–0.1)
Basophils Relative: 1 %
Eosinophils Absolute: 0 10*3/uL (ref 0.0–0.5)
Eosinophils Relative: 1 %
HCT: 31.5 % — ABNORMAL LOW (ref 36.0–46.0)
Hemoglobin: 10.3 g/dL — ABNORMAL LOW (ref 12.0–15.0)
Immature Granulocytes: 0 %
Lymphocytes Relative: 37 %
Lymphs Abs: 1.3 10*3/uL (ref 0.7–4.0)
MCH: 31.5 pg (ref 26.0–34.0)
MCHC: 32.7 g/dL (ref 30.0–36.0)
MCV: 96.3 fL (ref 80.0–100.0)
Monocytes Absolute: 0.4 10*3/uL (ref 0.1–1.0)
Monocytes Relative: 13 %
Neutro Abs: 1.6 10*3/uL — ABNORMAL LOW (ref 1.7–7.7)
Neutrophils Relative %: 48 %
Platelet Count: 105 10*3/uL — ABNORMAL LOW (ref 150–400)
RBC: 3.27 MIL/uL — ABNORMAL LOW (ref 3.87–5.11)
RDW: 18.3 % — ABNORMAL HIGH (ref 11.5–15.5)
WBC Count: 3.4 10*3/uL — ABNORMAL LOW (ref 4.0–10.5)
nRBC: 0 % (ref 0.0–0.2)

## 2021-03-04 MED ORDER — HEPARIN SOD (PORK) LOCK FLUSH 100 UNIT/ML IV SOLN
250.0000 [IU] | Freq: Once | INTRAVENOUS | Status: AC
  Administered 2021-03-04: 500 [IU]
  Filled 2021-03-04: qty 5

## 2021-03-04 MED ORDER — SODIUM CHLORIDE 0.9% FLUSH
10.0000 mL | Freq: Once | INTRAVENOUS | Status: AC
  Administered 2021-03-04: 10 mL
  Filled 2021-03-04: qty 10

## 2021-03-04 NOTE — Assessment & Plan Note (Signed)
She has intermittent elevated serum creatinine She is instructed to drink more fluids She has recently seen nephrologist for evaluation She will continue risk factor modifications

## 2021-03-04 NOTE — Telephone Encounter (Signed)
Scheduled appts per 4/4 sch msg. Pt aware.

## 2021-03-04 NOTE — Assessment & Plan Note (Signed)
Even though she felt that the olaparib is tolerable at reduced dose, she remain persistently pancytopenic with intermittent elevated serum creatinine For now, I do not plan to adjust the dose of her olaparib I recommend she returns next week to have her labs rechecked and we will set up a virtual visit to review test results Once we get her into stable dosing, we can space out her future appointment but for now, given her persistent abnormal blood work, I recommend we check her blood weekly She needs to be on olaparib for at least 3 months before we repeat CT imaging

## 2021-03-04 NOTE — Telephone Encounter (Signed)
-----   Message from Heath Lark, MD sent at 03/04/2021 11:35 AM EDT ----- Her CMP is a bit higher Advise her to start writing down liquid she takes daily; goal at least 80 ounces of water daily

## 2021-03-04 NOTE — Progress Notes (Signed)
[place] OFFICE PROGRESS NOTE  Patient Care Team: [name], MD as PCP - Onalee Hua, Wille Glaser, OD as Referring Physician (Optometry) [name], MD as Consulting Physician (Dermatology) Latanya Maudlin, MD as Consulting Physician (Orthopedic Surgery) Inda Castle, MD (Inactive) as Consulting Physician (Gastroenterology) Jacqulyn Liner, RN as Oncology Nurse Navigator (Oncology) Heath Lark, MD as Consulting Physician (Hematology and Oncology) Madelon Lips, MD as Consulting Physician (Nephrology)  ASSESSMENT & PLAN:  Ovarian cancer [facility]) Even though she felt that the olaparib is tolerable at reduced dose, she remain persistently pancytopenic with intermittent elevated serum creatinine For now, I do not plan to adjust the dose of her olaparib I recommend she returns next week to have her labs rechecked and we will set up a virtual visit to review test results Once we get her into stable dosing, we can space out her future appointment but for now, given her persistent abnormal blood work, I recommend we check her blood weekly She needs to be on olaparib for at least 3 months before we repeat CT imaging  Pancytopenia, acquired ([place]) Pancytopenia is mild, stable, since reduced dose treatment She is not symptomatic Observe closely for now  CKD stage 3 due to type 2 diabetes mellitus ([place]) She has intermittent elevated serum creatinine She is instructed to drink more fluids She has recently seen nephrologist for evaluation She will continue risk factor modifications   No orders of the defined types were placed in this encounter.   All questions were answered. The patient knows to call the clinic with any problems, questions or concerns. The total time spent in the appointment was 20 minutes encounter with patients including review of chart and various tests results, discussions about plan of care and coordination of care plan   Heath Lark, MD 03/04/2021  1:30 PM  INTERVAL HISTORY: Please see below for problem oriented charting. She returns with her husband for further follow-up She denies recent nausea or changes in bowel habits Denies abdominal distention She has occasional intermittent abdominal discomfort Her appetite is fair The patient denies any recent signs or symptoms of bleeding such as spontaneous epistaxis, hematuria or hematochezia.  SUMMARY OF ONCOLOGIC HISTORY: Oncology History Overview Note  HRD positive   Breast cancer of upper-inner quadrant of left female breast ([place])  02/26/2016 Initial Diagnosis   Left breast biopsy 11:00 position: invasive ductal carcinoma with DCIS, ER 90%, PR 10%, HER-2 negative, Ki-67 30%, grade 2, 2.2 cm palpable lesion T2 N0 stage II a clinical stage   03/18/2016 Surgery   Left lumpectomy: Invasive ductal carcinoma, grade 2, 6.3 cm, with high-grade DCIS, margins negative, 0/4 lymph nodes negative, ER 90%, via 10%, HER-2 negative ratio 0.97, Ki-67 30%, T3 N0 stage IIB   03/25/2016 Procedure   Genetic testing is negative for pathogenic mutations within any of the 20 Genes on the breast/ovarian cancer panel   04/04/2016 Oncotype testing   Oncotype DX recurrence score 37, 25% 10 year distant risk of recurrence   04/17/2016 - 07/31/2016 Chemotherapy   Adjuvant chemotherapy with dose dense Adriamycin and Cytoxan followed by Abraxane weekly 8 ( discontinued due to neuropathy)   09/01/2016 - 09/26/2016 Radiation Therapy   Adj XRT 1) Left breast: 42.5 Gy in 17 fractions. 2) Left breast boost: 7.5 Gy in 3 fractions.   12/02/2016 -  Anti-estrogen oral therapy   Anastrozole 1 mg daily   Ovarian cancer ([place])  04/03/2020 Imaging   US pelvis Complex cystic and solid mass in LEFT  adnexa 12.5 cm diameter question cystic ovarian neoplasm; recommend correlation with serum tumor markers and further evaluation by MR imaging with and without contrast.     04/04/2020 Imaging   US venous Doppler No evidence of deep  venous thrombosis in the right lower extremity. Left common femoral vein also patent.   04/15/2020 Imaging   MRI pelvis 1. Large complex solid and cystic mass arising from the right adnexa measuring 10.8 by 11.5 by 11.1 cm. This has an aggressive appearance with extensive enhancing mural soft tissue components. Findings are highly suspicious for malignant ovarian neoplasm. 2. Extensive bilateral retroperitoneal and bilateral iliac adenopathy compatible with metastatic disease. 3. Signs of extensive peritoneal carcinomatosis including ascites, enhancement, thickening and nodularity of the peritoneal reflections, omental caking and bulky peritoneal nodularity. 4. Suspected serosal involvement of the dome of bladder with loss of normal fat plane. Cannot rule out mural invasion by tumor.   04/17/2020 Tumor Marker   Patient's tumor was tested for the following markers: CA-125 Results of the tumor marker test revealed 1762.   04/20/2020 Cancer Staging   Staging form: Ovary, Fallopian Tube, and Primary Peritoneal Carcinoma, AJCC 8th Edition - Clinical: FIGO Stage IIIC (cT3, cN1, cM0) - Signed by Heath Lark, MD on 04/20/2020   04/23/2020 Imaging   1. Redemonstrated dominant mixed solid and cystic mass arising from the vicinity of the right ovary measuring at least 11.5 x 10.7 cm, not significantly changed compared to prior MR, and consistent with primary ovarian malignancy.  2. Numerous bulky retroperitoneal, bilateral iliac, and pelvic sidewall lymph nodes. 3. Moderate volume ascites throughout the abdomen and pelvis with subtle thickening and nodularity throughout the peritoneum, and extensive bulky nodular metastatic disease of the omentum. 4. Constellation of findings is consistent with advanced nodal and peritoneal metastatic disease. 5. There are prominent subcentimeter epicardial lymph nodes, nonspecific although suspicious for nodal metastatic disease. No definite nodal metastatic disease in the  chest. Attention on follow-up. 6. There are multiple small subpleural nodules at the right lung base overlying the diaphragm, measuring up to 7 mm. These are generally nonspecific and less favored to represent pulmonary metastatic disease given distribution. Attention on follow-up. 7. Somewhat coarse contour of the liver, suggestive of cirrhosis, although out without overt morphologic stigmata. 8. Aortic Atherosclerosis (ICD10-I70.0).     04/24/2020 Procedure   Successful placement of a right internal jugular approach power injectable Port-A-Cath. The catheter is ready for immediate use.     05/02/2020 Tumor Marker   Patient's tumor was tested for the following markers: CA-125 Results of the tumor marker test revealed 1921   05/03/2020 - 12/12/2020 Chemotherapy   The patient had carboplatin and taxol for chemotherapy treatment.     05/08/2020 Procedure   Successful ultrasound-guided paracentesis yielding 2.6 liters of peritoneal fluid.   05/16/2020 Procedure   Successful ultrasound-guided paracentesis yielding 3.2 liters of peritoneal fluid   05/25/2020 Procedure   Successful ultrasound-guided paracentesis yielding 3 liters of peritoneal fluid.     06/01/2020 Procedure   Successful ultrasound-guided therapeutic paracentesis yielding 1.7 liters of peritoneal fluid.   06/14/2020 Tumor Marker   Patient's tumor was tested for the following markers: CA-125 Results of the tumor marker test revealed 1309   06/20/2020 Imaging   1. Dominant mixed cystic and solid mass in the central pelvis is stable in the interval. 2. Clear interval decrease in retroperitoneal and pelvic sidewall lymphadenopathy. The bulky omental disease has also clearly decreased in the interval. 3. Interval decrease in ascites. 4. Stable  8 mm subpleural nodule along the diaphragm. 5. Aortic Atherosclerosis (ICD10-I70.0).   07/10/2020 Tumor Marker   Patient's tumor was tested for the following markers: CA-125 Results of the  tumor marker test revealed 220   08/03/2020 Tumor Marker   Patient's tumor was tested for the following markers: CA-125 Results of the tumor marker test revealed 53.3.   09/03/2020 Tumor Marker   Patient's tumor was tested for the following markers: CA-125 Results of the tumor marker test revealed 29.7   09/20/2020 Imaging   1. Substantial improvement. The right ovarian cystic and solid mass is half the volume that it measured on 06/20/2020. Similar reduction in the bulk of the omental caking of tumor. Prior ascites is resolved and the retroperitoneal adenopathy is markedly improved. 2. Other imaging findings of potential clinical significance: Stable pleural-based nodularity along the right hemidiaphragm measuring about 1.1 by 0.7 by 0.3 cm. Stable hypodense lesion of the right kidney upper pole, probably a cyst although the configuration of this lesion makes it difficult to obtain accurate density measurements. Left ovarian cyst, stable. Chondrocalcinosis involving the acetabular labra. Mild lumbar spondylosis and degenerative disc disease. 3. Aortic atherosclerosis.     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 (stage ypT3c), predominantly viable with focal fibrosis and hemosiderin laden macrophages (possible mild treatment effect)  B: Ovary and fallopian tube, right, salpingo-oophorectomy - High grade serous carcinoma involving right ovary and fallopian tube with surface involvement, size up to 8 cm  - Areas of necrosis and hemosiderin laden macrophages suggestive of treatment effect - 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 - See synoptic report and comment  C: Ovary and fallopian tube, left, salpingo-oophorectomy -  High grade serous carcinoma involving the left fallopian tube, size up to 0.5 cm - Ovary with no definite involvement by carcinoma identified - Nodular area of endometriosis and peritoneal inclusion cyst also present - See synoptic report and comment   Procedure:    Bilateral salpingo-oophorectomy    Procedure:    Omentectomy    Specimen Integrity of Right Ovary:    Intact with surface involvement by tumor    Specimen Integrity of Left Ovary:    Capsule intact   TUMOR Tumor Site:    Right fallopian tube: favor as primary site; also involves right ovary and left fallopian tube  Histologic Type:    Serous carcinoma  Histologic Grade:    High grade  Tumor Size:    Greatest Dimension (Centimeters): in fallopian tube: 1.0 cm; in ovary: 8.0 cm Ovarian Surface Involvement:    Present    Laterality:    Right  Fallopian Tube Surface Involvement:    Present    Laterality:    Bilateral  Other Tissue / Organ Involvement:    Right ovary  Other Tissue / Organ Involvement:    Right fallopian tube  Other Tissue / Organ Involvement:    Left fallopian tube  Other Tissue / Organ Involvement:    Omentum  Largest Extrapelvic Peritoneal Focus:    Macroscopic (greater than 2 cm)  Peritoneal / Ascitic Fluid:    Not submitted / unknown  Pleural Fluid:    Not submitted / unknown  Treatment Effect:    No definite or minimal response identified (chemotherapy response score [CRS] 1)   [place]  Lymph Nodes:    No lymph nodes submitted or found   PATHOLOGIC STAGE CLASSIFICATION (pTNM, AJCC 8th Edition) TNM Descriptors:    y (post-treatment)  Primary Tumor (pT):    pT3c  Regional Lymph Nodes (pN):    pNX   FIGO STAGE FIGO Stage:    IIIC   ADDITIONAL FINDINGS Additional Findings:    Serous tubal intraepithelial carcinoma (STIC)  Additional Findings:    Left ovary with no definite carcinoma identified, left-sided nodule of endometriosis and peritoneal inclusion cyst    10/18/2020 - 10/20/2020  Hospital Admission   She was admitted to [facility] for interval debulking surgery   11/19/2020 Tumor Marker   Patient's tumor was tested for the following markers: CA-125. Results of the tumor marker test revealed 38.3   12/12/2020 Tumor Marker   Patient's tumor was tested for the following markers CA-125. Results of the tumor marker test revealed 42.3.   01/02/2021 Tumor Marker   Patient's tumor was tested for the following markers: CA-125. Results of the tumor marker test revealed 37.3   01/29/2021 Tumor Marker   Patient's tumor was tested for the following markers: CA-125 Results of the tumor marker test revealed 44.3   01/29/2021 Imaging   1. Interval increase in loculated appearing ascites in the low central abdomen and pelvis. 2. There is extensive peritoneal nodularity surrounding this fluid, the nodularity itself not appreciably changed in appearance compared to prior examination. 3. Additional peritoneal nodularity and/or mesenteric lymph nodes are unchanged. 4. Interval decrease in size of a low-attenuation nodule overlying the right hemidiaphragm, now measuring 1.2 cm, previously 1.8 cm when measured similarly. Findings are consistent with treatment response of a metastatic nodule. No other evidence of intrathoracic metastatic disease. 5. Status post hysterectomy, oophorectomy, and omentectomy.   01/31/2021 -  Chemotherapy   She is started on Olaparib         REVIEW OF SYSTEMS:   Constitutional: Denies fevers, chills or abnormal weight loss Eyes: Denies blurriness of vision Ears, nose, mouth, throat, and face: Denies mucositis or sore throat Respiratory: Denies cough, dyspnea or wheezes Cardiovascular: Denies palpitation, chest discomfort or lower extremity swelling Gastrointestinal:  Denies nausea, heartburn or change in bowel habits Skin: Denies abnormal skin rashes Lymphatics: Denies new lymphadenopathy or easy bruising Neurological:Denies numbness, tingling or new  weaknesses Behavioral/Psych: Mood is stable, no new changes  All other systems were reviewed with the patient and are negative.  I have reviewed the past medical history, past surgical history, social history and family history with the patient and they are unchanged from previous note.  ALLERGIES:  is allergic to keflex [cephalexin], meloxicam, prednisone, premarin [estrogens conjugated], and trovan [alatrofloxacin].  MEDICATIONS:  Current Outpatient Medications  Medication Sig Dispense Refill  . acetaminophen (TYLENOL) 325 MG tablet Take 325 mg by mouth every 6 (six) hours as needed for headache.    . allopurinol (ZYLOPRIM) 300 MG tablet TAKE 1 TABLET BY MOUTH DAILY FOR GOUT PREVENTION 90 tablet 1  . blood glucose meter kit and supplies KIT Dispense based on patient and insurance preference. Use to check blood sugar once daily. Dx: E11.22, N18.30 1 each 12  . Cholecalciferol (VITAMIN D3) 5000 units CAPS Take 5,000 Units by mouth daily.    . cyanocobalamin 1000 MCG tablet Take 1,000 mcg by mouth daily.    Marland Kitchen ezetimibe (ZETIA) 10 MG tablet Take 10 mg by mouth daily.    Marland Kitchen gabapentin (NEURONTIN) 300 MG capsule Take 1 capsule in the morning and afternoon  and 2 capsules at night 360 capsule 3  . glimepiride (AMARYL) 1 MG tablet Take 1 tab by mouth in the morning if fasting is running 150+. Please do not take this medication if you are ill or not eating as this can cause a low blood sugar. 90 tablet 1  . glucose blood (FREESTYLE TEST STRIPS) test strip Check Blood Sugar Daily as directed (Dx: E11.29) 100 strip 3  . hydrochlorothiazide (HYDRODIURIL) 25 MG tablet Take  1 tablet  Daily  for BP and Fluid Retention /Ankle Swelling 90 tablet 0  . ketoconazole (NIZORAL) 2 % cream Apply 1 application topically 2 (two) times daily. To groin rash 60 g 0  . Lancets (FREESTYLE) lancets CHECK BLOOD SUGAR DAILY AS DIRECTED 100 each 3  . lidocaine-prilocaine (EMLA) cream Apply to affected area once (Patient taking  differently: Apply 1 application topically daily as needed (port access).) 30 g 3  . loratadine (CLARITIN) 10 MG tablet Take 10 mg by mouth daily as needed for allergies.    . Magnesium 250 MG TABS Take 250 mg by mouth every other day.    . metoprolol succinate (TOPROL-XL) 25 MG 24 hr tablet TAKE 1 TABLET BY MOUTH DAILY FOR BLOOD PRESSURE (Patient taking differently: Take 25 mg by mouth daily.) 90 tablet 3  . olaparib (LYNPARZA) 100 MG tablet Take 1 tablet (100 mg total) by mouth 2 (two) times daily. Swallow whole. May take with food to decrease nausea and vomiting. 60 tablet 11  . omeprazole (PRILOSEC) 20 MG capsule Take 1 capsule (20 mg total) by mouth daily. 30 capsule 11  . ondansetron (ZOFRAN) 8 MG tablet Take 1 tablet (8 mg total) by mouth 2 (two) times daily as needed for refractory nausea / vomiting. Start on day 3 after carboplatin chemo. 30 tablet 1  . prochlorperazine (COMPAZINE) 10 MG tablet Take 1 tablet (10 mg total) by mouth every 6 (six) hours as needed for nausea or vomiting. 60 tablet 9  . rosuvastatin (CRESTOR) 5 MG tablet Take 1 tablet (5 mg total) by mouth 3 (three) times a week. In the evening for cholesterol. 36 tablet 3  . vitamin C (ASCORBIC ACID) 500 MG tablet Take 500 mg by mouth every evening.      No current facility-administered medications for this visit.    PHYSICAL EXAMINATION: ECOG PERFORMANCE STATUS: 1 - Symptomatic but completely ambulatory  Vitals:   03/04/21 1024  BP: (!) 148/66  Pulse: 67  Resp: 18  Temp: (!) 97.4 F (36.3 C)  SpO2: 100%   Filed Weights   03/04/21 1024  Weight: 141 lb 6.4 oz (64.1 kg)    GENERAL:alert, no distress and comfortable SKIN: skin color, texture, turgor are normal, no rashes or significant lesions EYES: normal, Conjunctiva are pink and non-injected, sclera clear OROPHARYNX:no exudate, no erythema and lips, buccal mucosa, and tongue normal  NECK: supple, thyroid normal size, non-tender, without nodularity LYMPH:  no  palpable lymphadenopathy in the cervical, axillary or inguinal LUNGS: clear to auscultation and percussion with normal breathing effort HEART: regular rate & rhythm and no murmurs and no lower extremity edema ABDOMEN:abdomen soft, non-tender and normal bowel sounds Musculoskeletal:no cyanosis of digits and no clubbing  NEURO: alert & oriented x 3 with fluent speech, no focal motor/sensory deficits  LABORATORY DATA:  I have reviewed the data as listed    Component Value Date/Time   NA 143 03/04/2021 1003   NA 141 11/06/2016 1102   K 4.0 03/04/2021 1003  K 3.9 11/06/2016 1102   CL 109 03/04/2021 1003   CO2 22 03/04/2021 1003   CO2 27 11/06/2016 1102   GLUCOSE 87 03/04/2021 1003   GLUCOSE 130 11/06/2016 1102   BUN 25 (H) 03/04/2021 1003   BUN 18.0 11/06/2016 1102   CREATININE 1.86 (H) 03/04/2021 1003   CREATININE 1.42 (H) 02/21/2021 1414   CREATININE 1.3 (H) 11/06/2016 1102   CALCIUM 10.1 03/04/2021 1003   CALCIUM 10.2 11/06/2016 1102   PROT 7.2 03/04/2021 1003   PROT 7.1 11/06/2016 1102   ALBUMIN 4.0 03/04/2021 1003   ALBUMIN 3.7 11/06/2016 1102   AST 42 (H) 03/04/2021 1003   AST 52 (H) 11/06/2016 1102   ALT 25 03/04/2021 1003   ALT 58 (H) 11/06/2016 1102   ALKPHOS 124 03/04/2021 1003   ALKPHOS 159 (H) 11/06/2016 1102   BILITOT 0.4 03/04/2021 1003   BILITOT 0.53 11/06/2016 1102   GFRNONAA 27 (L) 03/04/2021 1003   GFRNONAA 34 (L) 02/21/2021 1414   GFRAA 40 (L) 02/21/2021 1414    No results found for: SPEP, UPEP  Lab Results  Component Value Date   WBC 3.4 (L) 03/04/2021   NEUTROABS 1.6 (L) 03/04/2021   HGB 10.3 (L) 03/04/2021   HCT 31.5 (L) 03/04/2021   MCV 96.3 03/04/2021   PLT 105 (L) 03/04/2021      Chemistry      Component Value Date/Time   NA 143 03/04/2021 1003   NA 141 11/06/2016 1102   K 4.0 03/04/2021 1003   K 3.9 11/06/2016 1102   CL 109 03/04/2021 1003   CO2 22 03/04/2021 1003   CO2 27 11/06/2016 1102   BUN 25 (H) 03/04/2021 1003   BUN 18.0  11/06/2016 1102   CREATININE 1.86 (H) 03/04/2021 1003   CREATININE 1.42 (H) 02/21/2021 1414   CREATININE 1.3 (H) 11/06/2016 1102      Component Value Date/Time   CALCIUM 10.1 03/04/2021 1003   CALCIUM 10.2 11/06/2016 1102   ALKPHOS 124 03/04/2021 1003   ALKPHOS 159 (H) 11/06/2016 1102   AST 42 (H) 03/04/2021 1003   AST 52 (H) 11/06/2016 1102   ALT 25 03/04/2021 1003   ALT 58 (H) 11/06/2016 1102   BILITOT 0.4 03/04/2021 1003   BILITOT 0.53 11/06/2016 1102       RADIOGRAPHIC STUDIES: I have personally reviewed the radiological images as listed and agreed with the findings in the report. US Venous Img Lower Bilateral (DVT)  Result Date: 02/20/2021 CLINICAL DATA:  Lower extremity edema, primarily of the right lower extremity. EXAM: BILATERAL LOWER EXTREMITY VENOUS DOPPLER ULTRASOUND TECHNIQUE: Gray-scale sonography with graded compression, as well as color Doppler and duplex ultrasound were performed to evaluate the lower extremity deep venous systems from the level of the common femoral vein and including the common femoral, femoral, profunda femoral, popliteal and calf veins including the posterior tibial, peroneal and gastrocnemius veins when visible. The superficial great saphenous vein was also interrogated. Spectral Doppler was utilized to evaluate flow at rest and with distal augmentation maneuvers in the common femoral, femoral and popliteal veins. COMPARISON:  None. FINDINGS: RIGHT LOWER EXTREMITY Common Femoral Vein: No evidence of thrombus. Normal compressibility, respiratory phasicity and response to augmentation. Saphenofemoral Junction: No evidence of thrombus. Normal compressibility and flow on color Doppler imaging. Profunda Femoral Vein: No evidence of thrombus. Normal compressibility and flow on color Doppler imaging. Femoral Vein: No evidence of thrombus. Normal compressibility, respiratory phasicity and response to augmentation. Popliteal Vein: No evidence of thrombus.  Normal  compressibility, respiratory phasicity and response to augmentation. Calf Veins: No evidence of thrombus. Normal compressibility and flow on color Doppler imaging. Superficial Great Saphenous Vein: No evidence of thrombus. Normal compressibility. Venous Reflux:  None. Other Findings: No evidence of superficial thrombophlebitis or abnormal fluid collection. LEFT LOWER EXTREMITY Common Femoral Vein: No evidence of thrombus. Normal compressibility, respiratory phasicity and response to augmentation. Saphenofemoral Junction: No evidence of thrombus. Normal compressibility and flow on color Doppler imaging. Profunda Femoral Vein: No evidence of thrombus. Normal compressibility and flow on color Doppler imaging. Femoral Vein: No evidence of thrombus. Normal compressibility, respiratory phasicity and response to augmentation. Popliteal Vein: No evidence of thrombus. Normal compressibility, respiratory phasicity and response to augmentation. Calf Veins: No evidence of thrombus. Normal compressibility and flow on color Doppler imaging. Superficial Great Saphenous Vein: No evidence of thrombus. Normal compressibility. Venous Reflux:  None. Other Findings: No evidence of superficial thrombophlebitis or abnormal fluid collection. IMPRESSION: No evidence of deep venous thrombosis in either lower extremity. Electronically Signed   By: Aletta Edouard M.D.   On: 02/20/2021 13:27

## 2021-03-04 NOTE — Telephone Encounter (Signed)
Pt notified of results, verbalized understanding and agreement to log daily intake.

## 2021-03-04 NOTE — Telephone Encounter (Signed)
Left message for patient to call back, GY:[phone].

## 2021-03-04 NOTE — Patient Instructions (Signed)
Implanted [facility] Guide An implanted port is a device that is placed under the skin. It is usually placed in the chest. The device can be used to give IV medicine, to take blood, or for dialysis. You may have an implanted port if:  You need IV medicine that would be irritating to the small veins in your hands or arms.  You need IV medicines, such as antibiotics, for a long period of time.  You need IV nutrition for a long period of time.  You need dialysis. When you have a port, your health care provider can choose to use the port instead of veins in your arms for these procedures. You may have fewer limitations when using a port than you would if you used other types of long-term IVs, and you will likely be able to return to normal activities after your incision heals. An implanted port has two main parts:  Reservoir. The reservoir is the part where a needle is inserted to give medicines or draw blood. The reservoir is round. After it is placed, it appears as a small, raised area under your skin.  Catheter. The catheter is a thin, flexible tube that connects the reservoir to a vein. Medicine that is inserted into the reservoir goes into the catheter and then into the vein. How is my port accessed? To access your port:  A numbing cream may be placed on the skin over the port site.  Your health care provider will put on a mask and sterile gloves.  The skin over your port will be cleaned carefully with a germ-killing soap and allowed to dry.  Your health care provider will gently pinch the port and insert a needle into it.  Your health care provider will check for a blood return to make sure the port is in the vein and is not clogged.  If your port needs to remain accessed to get medicine continuously (constant infusion), your health care provider will place a clear bandage (dressing) over the needle site. The dressing and needle will need to be changed every week, or as told by your  health care provider. What is flushing? Flushing helps keep the port from getting clogged. Follow instructions from your health care provider about how and when to flush the port. Ports are usually flushed with saline solution or a medicine called heparin. The need for flushing will depend on how the port is used:  If the port is only used from time to time to give medicines or draw blood, the port may need to be flushed: ? Before and after medicines have been given. ? Before and after blood has been drawn. ? As part of routine maintenance. Flushing may be recommended every 4-6 weeks.  If a constant infusion is running, the port may not need to be flushed.  Throw away any syringes in a disposal container that is meant for sharp items (sharps container). You can buy a sharps container from a pharmacy, or you can make one by using an empty hard plastic bottle with a cover. How long will my port stay implanted? The port can stay in for as long as your health care provider thinks it is needed. When it is time for the port to come out, a surgery will be done to remove it. The surgery will be similar to the procedure that was done to put the port in. Follow these instructions at home:  Flush your port as told by your health care  provider.  If you need an infusion over several days, follow instructions from your health care provider about how to take care of your port site. Make sure you: ? Wash your hands with soap and water before you change your dressing. If soap and water are not available, use alcohol-based hand sanitizer. ? Change your dressing as told by your health care provider. ? Place any used dressings or infusion bags into a plastic bag. Throw that bag in the trash. ? Keep the dressing that covers the needle clean and dry. Do not get it wet. ? Do not use scissors or sharp objects near the tube. ? Keep the tube clamped, unless it is being used.  Check your port site every day for signs  of infection. Check for: ? Redness, swelling, or pain. ? Fluid or blood. ? Pus or a bad smell.  Protect the skin around the port site. ? Avoid wearing bra straps that rub or irritate the site. ? Protect the skin around your port from seat belts. Place a soft pad over your chest if needed.  Bathe or shower as told by your health care provider. The site may get wet as long as you are not actively receiving an infusion.  Return to your normal activities as told by your health care provider. Ask your health care provider what activities are safe for you.  Carry a medical alert card or wear a medical alert bracelet at all times. This will let health care providers know that you have an implanted port in case of an emergency.   Get help right away if:  You have redness, swelling, or pain at the port site.  You have fluid or blood coming from your port site.  You have pus or a bad smell coming from the port site.  You have a fever. Summary  Implanted ports are usually placed in the chest for long-term IV access.  Follow instructions from your health care provider about flushing the port and changing bandages (dressings).  Take care of the area around your port by avoiding clothing that puts pressure on the area, and by watching for signs of infection.  Protect the skin around your port from seat belts. Place a soft pad over your chest if needed.  Get help right away if you have a fever or you have redness, swelling, pain, drainage, or a bad smell at the port site. This information is not intended to replace advice given to you by your health care provider. Make sure you discuss any questions you have with your health care provider. Document Revised: 04/02/2020 Document Reviewed: 04/02/2020 [employer] Patient Education  [employer].

## 2021-03-04 NOTE — Assessment & Plan Note (Signed)
Pancytopenia is mild, stable, since reduced dose treatment She is not symptomatic Observe closely for now

## 2021-03-05 ENCOUNTER — Telehealth: Payer: Self-pay

## 2021-03-05 LAB — CA 125: Cancer Antigen (CA) 125: 39.7 U/mL — ABNORMAL HIGH (ref 0.0–38.1)

## 2021-03-05 NOTE — Telephone Encounter (Signed)
RN spoke with patient regarding lab results.  No further needs at this time.

## 2021-03-12 ENCOUNTER — Encounter: Payer: Self-pay | Admitting: Hematology and Oncology

## 2021-03-12 ENCOUNTER — Inpatient Hospital Stay: Payer: PPO

## 2021-03-12 ENCOUNTER — Other Ambulatory Visit: Payer: Self-pay

## 2021-03-12 ENCOUNTER — Inpatient Hospital Stay: Payer: PPO | Admitting: Hematology and Oncology

## 2021-03-12 DIAGNOSIS — R748 Abnormal levels of other serum enzymes: Secondary | ICD-10-CM | POA: Diagnosis not present

## 2021-03-12 DIAGNOSIS — C569 Malignant neoplasm of unspecified ovary: Secondary | ICD-10-CM | POA: Diagnosis not present

## 2021-03-12 DIAGNOSIS — Z452 Encounter for adjustment and management of vascular access device: Secondary | ICD-10-CM | POA: Diagnosis not present

## 2021-03-12 DIAGNOSIS — E1122 Type 2 diabetes mellitus with diabetic chronic kidney disease: Secondary | ICD-10-CM

## 2021-03-12 DIAGNOSIS — D61818 Other pancytopenia: Secondary | ICD-10-CM

## 2021-03-12 DIAGNOSIS — N183 Chronic kidney disease, stage 3 unspecified: Secondary | ICD-10-CM | POA: Diagnosis not present

## 2021-03-12 DIAGNOSIS — C50212 Malignant neoplasm of upper-inner quadrant of left female breast: Secondary | ICD-10-CM

## 2021-03-12 LAB — CBC WITH DIFFERENTIAL (CANCER CENTER ONLY)
Abs Immature Granulocytes: 0.01 10*3/uL (ref 0.00–0.07)
Basophils Absolute: 0 10*3/uL (ref 0.0–0.1)
Basophils Relative: 1 %
Eosinophils Absolute: 0 10*3/uL (ref 0.0–0.5)
Eosinophils Relative: 1 %
HCT: 31.2 % — ABNORMAL LOW (ref 36.0–46.0)
Hemoglobin: 10.1 g/dL — ABNORMAL LOW (ref 12.0–15.0)
Immature Granulocytes: 0 %
Lymphocytes Relative: 28 %
Lymphs Abs: 1.1 10*3/uL (ref 0.7–4.0)
MCH: 31.7 pg (ref 26.0–34.0)
MCHC: 32.4 g/dL (ref 30.0–36.0)
MCV: 97.8 fL (ref 80.0–100.0)
Monocytes Absolute: 0.4 10*3/uL (ref 0.1–1.0)
Monocytes Relative: 10 %
Neutro Abs: 2.2 10*3/uL (ref 1.7–7.7)
Neutrophils Relative %: 60 %
Platelet Count: 94 10*3/uL — ABNORMAL LOW (ref 150–400)
RBC: 3.19 MIL/uL — ABNORMAL LOW (ref 3.87–5.11)
RDW: 17.7 % — ABNORMAL HIGH (ref 11.5–15.5)
WBC Count: 3.8 10*3/uL — ABNORMAL LOW (ref 4.0–10.5)
nRBC: 0 % (ref 0.0–0.2)

## 2021-03-12 LAB — CMP (CANCER CENTER ONLY)
ALT: 57 U/L — ABNORMAL HIGH (ref 0–44)
AST: 49 U/L — ABNORMAL HIGH (ref 15–41)
Albumin: 3.9 g/dL (ref 3.5–5.0)
Alkaline Phosphatase: 138 U/L — ABNORMAL HIGH (ref 38–126)
Anion gap: 11 (ref 5–15)
BUN: 23 mg/dL (ref 8–23)
CO2: 25 mmol/L (ref 22–32)
Calcium: 10.1 mg/dL (ref 8.9–10.3)
Chloride: 108 mmol/L (ref 98–111)
Creatinine: 1.64 mg/dL — ABNORMAL HIGH (ref 0.44–1.00)
GFR, Estimated: 31 mL/min — ABNORMAL LOW (ref >60)
Glucose, Bld: 111 mg/dL — ABNORMAL HIGH (ref 70–99)
Potassium: 4 mmol/L (ref 3.5–5.1)
Sodium: 144 mmol/L (ref 135–145)
Total Bilirubin: 0.6 mg/dL (ref 0.3–1.2)
Total Protein: 6.8 g/dL (ref 6.5–8.1)

## 2021-03-12 NOTE — Assessment & Plan Note (Signed)
Her serum creatinine has improved I recommend she continues on fluid hydration

## 2021-03-12 NOTE — Assessment & Plan Note (Signed)
She has developed mild worsening pancytopenia again I recommend she hold treatment for week She is not symptomatic Continue supportive care She needs to be on treatment for at least 3 months before repeat imaging study

## 2021-03-12 NOTE — Assessment & Plan Note (Signed)
Her platelet count has dropped to under 100,000 She is not symptomatic She will hold her treatment for 1 week and hopefully we can restart I have checked her serum vitamin B12 and iron studies several months ago and they were adequate

## 2021-03-12 NOTE — Progress Notes (Signed)
HEMATOLOGY-ONCOLOGY ELECTRONIC VISIT PROGRESS NOTE  Patient Care Team: [name], MD as PCP - Kinnie Scales, OD as Referring Physician (Optometry) [name], MD as Consulting Physician (Dermatology) Latanya Maudlin, MD as Consulting Physician (Orthopedic Surgery) Inda Castle, MD (Inactive) as Consulting Physician (Gastroenterology) Awanda Mink Craige Cotta, RN as Oncology Nurse Navigator (Oncology) Heath Lark, MD as Consulting Physician (Hematology and Oncology) Madelon Lips, MD as Consulting Physician (Nephrology)  I connected with the patient by telephone call today  ASSESSMENT & PLAN:  Ovarian cancer [facility]) She has developed mild worsening pancytopenia again I recommend she hold treatment for week She is not symptomatic Continue supportive care She needs to be on treatment for at least 3 months before repeat imaging study  Pancytopenia, acquired [facility]) Her platelet count has dropped to under 100,000 She is not symptomatic She will hold her treatment for 1 week and hopefully we can restart I have checked her serum vitamin B12 and iron studies several months ago and they were adequate  CKD stage 3 due to type 2 diabetes mellitus ([place]) Her serum creatinine has improved I recommend she continues on fluid hydration  Elevated liver enzymes She has mild elevated liver enzymes, could be attributed to recent statin Observe closely for now   No orders of the defined types were placed in this encounter.   INTERVAL HISTORY: Please see below for problem oriented charting. The purpose of today's visit is to review test results from this morning So far, she tolerated olaparib well Denies any changes in bowel habits No recent bleeding Her appetite is fair  SUMMARY OF ONCOLOGIC HISTORY: Oncology History Overview Note  HRD positive   Breast cancer of upper-inner quadrant of left female breast ([place])  02/26/2016 Initial Diagnosis   Left breast biopsy 11:00 position:  invasive ductal carcinoma with DCIS, ER 90%, PR 10%, HER-2 negative, Ki-67 30%, grade 2, 2.2 cm palpable lesion T2 N0 stage II a clinical stage   03/18/2016 Surgery   Left lumpectomy: Invasive ductal carcinoma, grade 2, 6.3 cm, with high-grade DCIS, margins negative, 0/4 lymph nodes negative, ER 90%, via 10%, HER-2 negative ratio 0.97, Ki-67 30%, T3 N0 stage IIB   03/25/2016 Procedure   Genetic testing is negative for pathogenic mutations within any of the 20 Genes on the breast/ovarian cancer panel   04/04/2016 Oncotype testing   Oncotype DX recurrence score 37, 25% 10 year distant risk of recurrence   04/17/2016 - 07/31/2016 Chemotherapy   Adjuvant chemotherapy with dose dense Adriamycin and Cytoxan followed by Abraxane weekly 8 ( discontinued due to neuropathy)   09/01/2016 - 09/26/2016 Radiation Therapy   Adj XRT 1) Left breast: 42.5 Gy in 17 fractions. 2) Left breast boost: 7.5 Gy in 3 fractions.   12/02/2016 -  Anti-estrogen oral therapy   Anastrozole 1 mg daily   Ovarian cancer ([place])  04/03/2020 Imaging   US pelvis Complex cystic and solid mass in LEFT adnexa 12.5 cm diameter question cystic ovarian neoplasm; recommend correlation with serum tumor markers and further evaluation by MR imaging with and without contrast.     04/04/2020 Imaging   US venous Doppler No evidence of deep venous thrombosis in the right lower extremity. Left common femoral vein also patent.   04/15/2020 Imaging   MRI pelvis 1. Large complex solid and cystic mass arising from the right adnexa measuring 10.8 by 11.5 by 11.1 cm. This has an aggressive appearance with extensive enhancing mural soft tissue components. Findings are highly suspicious for malignant ovarian neoplasm.  2. Extensive bilateral retroperitoneal and bilateral iliac adenopathy compatible with metastatic disease. 3. Signs of extensive peritoneal carcinomatosis including ascites, enhancement, thickening and nodularity of the peritoneal reflections,  omental caking and bulky peritoneal nodularity. 4. Suspected serosal involvement of the dome of bladder with loss of normal fat plane. Cannot rule out mural invasion by tumor.   04/17/2020 Tumor Marker   Patient's tumor was tested for the following markers: CA-125 Results of the tumor marker test revealed 1762.   04/20/2020 Cancer Staging   Staging form: Ovary, Fallopian Tube, and Primary Peritoneal Carcinoma, AJCC 8th Edition - Clinical: FIGO Stage IIIC (cT3, cN1, cM0) - Signed by Heath Lark, MD on 04/20/2020   04/23/2020 Imaging   1. Redemonstrated dominant mixed solid and cystic mass arising from the vicinity of the right ovary measuring at least 11.5 x 10.7 cm, not significantly changed compared to prior MR, and consistent with primary ovarian malignancy.  2. Numerous bulky retroperitoneal, bilateral iliac, and pelvic sidewall lymph nodes. 3. Moderate volume ascites throughout the abdomen and pelvis with subtle thickening and nodularity throughout the peritoneum, and extensive bulky nodular metastatic disease of the omentum. 4. Constellation of findings is consistent with advanced nodal and peritoneal metastatic disease. 5. There are prominent subcentimeter epicardial lymph nodes, nonspecific although suspicious for nodal metastatic disease. No definite nodal metastatic disease in the chest. Attention on follow-up. 6. There are multiple small subpleural nodules at the right lung base overlying the diaphragm, measuring up to 7 mm. These are generally nonspecific and less favored to represent pulmonary metastatic disease given distribution. Attention on follow-up. 7. Somewhat coarse contour of the liver, suggestive of cirrhosis, although out without overt morphologic stigmata. 8. Aortic Atherosclerosis (ICD10-I70.0).     04/24/2020 Procedure   Successful placement of a right internal jugular approach power injectable Port-A-Cath. The catheter is ready for immediate use.     05/02/2020 Tumor  Marker   Patient's tumor was tested for the following markers: CA-125 Results of the tumor marker test revealed 1921   05/03/2020 - 12/12/2020 Chemotherapy   The patient had carboplatin and taxol for chemotherapy treatment.     05/08/2020 Procedure   Successful ultrasound-guided paracentesis yielding 2.6 liters of peritoneal fluid.   05/16/2020 Procedure   Successful ultrasound-guided paracentesis yielding 3.2 liters of peritoneal fluid   05/25/2020 Procedure   Successful ultrasound-guided paracentesis yielding 3 liters of peritoneal fluid.     06/01/2020 Procedure   Successful ultrasound-guided therapeutic paracentesis yielding 1.7 liters of peritoneal fluid.   06/14/2020 Tumor Marker   Patient's tumor was tested for the following markers: CA-125 Results of the tumor marker test revealed 1309   06/20/2020 Imaging   1. Dominant mixed cystic and solid mass in the central pelvis is stable in the interval. 2. Clear interval decrease in retroperitoneal and pelvic sidewall lymphadenopathy. The bulky omental disease has also clearly decreased in the interval. 3. Interval decrease in ascites. 4. Stable 8 mm subpleural nodule along the diaphragm. 5. Aortic Atherosclerosis (ICD10-I70.0).   07/10/2020 Tumor Marker   Patient's tumor was tested for the following markers: CA-125 Results of the tumor marker test revealed 220   08/03/2020 Tumor Marker   Patient's tumor was tested for the following markers: CA-125 Results of the tumor marker test revealed 53.3.   09/03/2020 Tumor Marker   Patient's tumor was tested for the following markers: CA-125 Results of the tumor marker test revealed 29.7   09/20/2020 Imaging   1. Substantial improvement. The right ovarian cystic and solid mass  is half the volume that it measured on 06/20/2020. Similar reduction in the bulk of the omental caking of tumor. Prior ascites is resolved and the retroperitoneal adenopathy is markedly improved. 2. Other imaging findings  of potential clinical significance: Stable pleural-based nodularity along the right hemidiaphragm measuring about 1.1 by 0.7 by 0.3 cm. Stable hypodense lesion of the right kidney upper pole, probably a cyst although the configuration of this lesion makes it difficult to obtain accurate density measurements. Left ovarian cyst, stable. Chondrocalcinosis involving the acetabular labra. Mild lumbar spondylosis and degenerative disc disease. 3. Aortic atherosclerosis.     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 (stage ypT3c), predominantly viable with focal fibrosis and hemosiderin laden macrophages (possible mild treatment effect)  B: Ovary and fallopian tube, right, salpingo-oophorectomy - High grade serous carcinoma involving right ovary and fallopian tube with surface involvement, size up to 8 cm  - Areas of necrosis and hemosiderin laden macrophages suggestive of treatment effect - 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 - See synoptic report and comment  C: Ovary and fallopian tube, left, salpingo-oophorectomy - High grade serous carcinoma involving the left fallopian tube, size up to 0.5 cm - Ovary with no definite involvement by carcinoma identified - Nodular area of endometriosis and peritoneal inclusion cyst also present - See synoptic report and comment   Procedure:    Bilateral salpingo-oophorectomy    Procedure:    Omentectomy    Specimen Integrity of Right Ovary:    Intact with surface involvement by tumor    Specimen Integrity of Left Ovary:    Capsule intact   TUMOR Tumor Site:    Right fallopian tube: favor as primary site; also involves right ovary and left fallopian tube  Histologic Type:    Serous  carcinoma  Histologic Grade:    High grade  Tumor Size:    Greatest Dimension (Centimeters): in fallopian tube: 1.0 cm; in ovary: 8.0 cm Ovarian Surface Involvement:    Present    Laterality:    Right  Fallopian Tube Surface Involvement:    Present    Laterality:    Bilateral  Other Tissue / Organ Involvement:    Right ovary  Other Tissue / Organ Involvement:    Right fallopian tube  Other Tissue / Organ Involvement:    Left fallopian tube  Other Tissue / Organ Involvement:    Omentum  Largest Extrapelvic Peritoneal Focus:    Macroscopic (greater than 2 cm)  Peritoneal / Ascitic Fluid:    Not submitted / unknown  Pleural Fluid:    Not submitted / unknown  Treatment Effect:    No definite or minimal response identified (chemotherapy response score [CRS] 1)   LYMPH NODES Regional Lymph Nodes:    No lymph nodes submitted or found   PATHOLOGIC STAGE CLASSIFICATION (pTNM, AJCC 8th Edition) TNM Descriptors:    y (post-treatment)  Primary Tumor (pT):    pT3c  Regional Lymph Nodes (pN):    pNX   FIGO STAGE FIGO Stage:    IIIC   ADDITIONAL FINDINGS Additional Findings:    Serous tubal intraepithelial carcinoma (STIC)  Additional Findings:    Left ovary with no definite carcinoma identified, left-sided nodule of endometriosis and peritoneal inclusion cyst    10/18/2020 - 10/20/2020 Hospital Admission  She was admitted to [place] for interval debulking surgery   11/19/2020 Tumor Marker   Patient's tumor was tested for the following markers: CA-125. Results of the tumor marker test revealed 38.3   12/12/2020 Tumor Marker   Patient's tumor was tested for the following markers CA-125. Results of the tumor marker test revealed 42.3.   01/02/2021 Tumor Marker   Patient's tumor was tested for the following markers: CA-125. Results of the tumor marker test revealed 37.3   01/29/2021 Tumor Marker   Patient's tumor was tested for the following markers: CA-125 Results of the tumor marker  test revealed 44.3   01/29/2021 Imaging   1. Interval increase in loculated appearing ascites in the low central abdomen and pelvis. 2. There is extensive peritoneal nodularity surrounding this fluid, the nodularity itself not appreciably changed in appearance compared to prior examination. 3. Additional peritoneal nodularity and/or mesenteric lymph nodes are unchanged. 4. Interval decrease in size of a low-attenuation nodule overlying the right hemidiaphragm, now measuring 1.2 cm, previously 1.8 cm when measured similarly. Findings are consistent with treatment response of a metastatic nodule. No other evidence of intrathoracic metastatic disease. 5. Status post hysterectomy, oophorectomy, and omentectomy.   01/31/2021 -  Chemotherapy   She is started on Olaparib       03/04/2021 Tumor Marker   Patient's tumor was tested for the following markers: CA-125 Results of the tumor marker test revealed 39.7     REVIEW OF SYSTEMS:   Constitutional: Denies fevers, chills or abnormal weight loss Eyes: Denies blurriness of vision Ears, nose, mouth, throat, and face: Denies mucositis or sore throat Respiratory: Denies cough, dyspnea or wheezes Cardiovascular: Denies palpitation, chest discomfort Gastrointestinal:  Denies nausea, heartburn or change in bowel habits Skin: Denies abnormal skin rashes Lymphatics: Denies new lymphadenopathy or easy bruising Neurological:Denies numbness, tingling or new weaknesses Behavioral/Psych: Mood is stable, no new changes  Extremities: No lower extremity edema All other systems were reviewed with the patient and are negative.  I have reviewed the past medical history, past surgical history, social history and family history with the patient and they are unchanged from previous note.  ALLERGIES:  is allergic to keflex [cephalexin], meloxicam, prednisone, premarin [estrogens conjugated], and trovan [alatrofloxacin].  MEDICATIONS:  Current Outpatient Medications   Medication Sig Dispense Refill  . acetaminophen (TYLENOL) 325 MG tablet Take 325 mg by mouth every 6 (six) hours as needed for headache.    . allopurinol (ZYLOPRIM) 300 MG tablet TAKE 1 TABLET BY MOUTH DAILY FOR GOUT PREVENTION 90 tablet 1  . blood glucose meter kit and supplies KIT Dispense based on patient and insurance preference. Use to check blood sugar once daily. Dx: E11.22, N18.30 1 each 12  . Cholecalciferol (VITAMIN D3) 5000 units CAPS Take 5,000 Units by mouth daily.    . cyanocobalamin 1000 MCG tablet Take 1,000 mcg by mouth daily.    Marland Kitchen ezetimibe (ZETIA) 10 MG tablet Take 10 mg by mouth daily.    Marland Kitchen gabapentin (NEURONTIN) 300 MG capsule Take 1 capsule in the morning and afternoon and 2 capsules at night 360 capsule 3  . glimepiride (AMARYL) 1 MG tablet Take 1 tab by mouth in the morning if fasting is running 150+. Please do not take this medication if you are ill or not eating as this can cause a low blood sugar. 90 tablet 1  . glucose blood (FREESTYLE TEST STRIPS) test strip Check Blood Sugar Daily as directed (Dx: E11.29) 100 strip 3  .  hydrochlorothiazide (HYDRODIURIL) 25 MG tablet Take  1 tablet  Daily  for BP and Fluid Retention /Ankle Swelling 90 tablet 0  . ketoconazole (NIZORAL) 2 % cream Apply 1 application topically 2 (two) times daily. To groin rash 60 g 0  . Lancets (FREESTYLE) lancets CHECK BLOOD SUGAR DAILY AS DIRECTED 100 each 3  . lidocaine-prilocaine (EMLA) cream Apply to affected area once (Patient taking differently: Apply 1 application topically daily as needed (port access).) 30 g 3  . loratadine (CLARITIN) 10 MG tablet Take 10 mg by mouth daily as needed for allergies.    . Magnesium 250 MG TABS Take 250 mg by mouth every other day.    . metoprolol succinate (TOPROL-XL) 25 MG 24 hr tablet TAKE 1 TABLET BY MOUTH DAILY FOR BLOOD PRESSURE (Patient taking differently: Take 25 mg by mouth daily.) 90 tablet 3  . olaparib (LYNPARZA) 100 MG tablet Take 1 tablet (100 mg  total) by mouth 2 (two) times daily. Swallow whole. May take with food to decrease nausea and vomiting. 60 tablet 11  . omeprazole (PRILOSEC) 20 MG capsule Take 1 capsule (20 mg total) by mouth daily. 30 capsule 11  . ondansetron (ZOFRAN) 8 MG tablet Take 1 tablet (8 mg total) by mouth 2 (two) times daily as needed for refractory nausea / vomiting. Start on day 3 after carboplatin chemo. 30 tablet 1  . prochlorperazine (COMPAZINE) 10 MG tablet Take 1 tablet (10 mg total) by mouth every 6 (six) hours as needed for nausea or vomiting. 60 tablet 9  . rosuvastatin (CRESTOR) 5 MG tablet Take 1 tablet (5 mg total) by mouth 3 (three) times a week. In the evening for cholesterol. 36 tablet 3  . vitamin C (ASCORBIC ACID) 500 MG tablet Take 500 mg by mouth every evening.      No current facility-administered medications for this visit.    PHYSICAL EXAMINATION: ECOG PERFORMANCE STATUS: 0 - Asymptomatic  LABORATORY DATA:  I have reviewed the data as listed CMP Latest Ref Rng & Units 03/12/2021 03/04/2021 02/21/2021  Glucose 70 - 99 mg/dL 111(H) 87 209(H)  BUN 8 - 23 mg/dL 23 25(H) 22  Creatinine 0.44 - 1.00 mg/dL 1.64(H) 1.86(H) 1.42(H)  Sodium 135 - 145 mmol/L 144 143 143  Potassium 3.5 - 5.1 mmol/L 4.0 4.0 4.0  Chloride 98 - 111 mmol/L 108 109 105  CO2 22 - 32 mmol/L 25 22 29   Calcium 8.9 - 10.3 mg/dL 10.1 10.1 9.6  Total Protein 6.5 - 8.1 g/dL 6.8 7.2 6.3  Total Bilirubin 0.3 - 1.2 mg/dL 0.6 0.4 0.5  Alkaline Phos 38 - 126 U/L 138(H) 124 -  AST 15 - 41 U/L 49(H) 42(H) 25  ALT 0 - 44 U/L 57(H) 25 11    Lab Results  Component Value Date   WBC 3.8 (L) 03/12/2021   HGB 10.1 (L) 03/12/2021   HCT 31.2 (L) 03/12/2021   MCV 97.8 03/12/2021   PLT 94 (L) 03/12/2021   NEUTROABS 2.2 03/12/2021     RADIOGRAPHIC STUDIES: I have personally reviewed the radiological images as listed and agreed with the findings in the report. US Venous Img Lower Bilateral (DVT)  Result Date: 02/20/2021 CLINICAL  DATA:  Lower extremity edema, primarily of the right lower extremity. EXAM: BILATERAL LOWER EXTREMITY VENOUS DOPPLER ULTRASOUND TECHNIQUE: Gray-scale sonography with graded compression, as well as color Doppler and duplex ultrasound were performed to evaluate the lower extremity deep venous systems from the level of the common femoral vein  and including the common femoral, femoral, profunda femoral, popliteal and calf veins including the posterior tibial, peroneal and gastrocnemius veins when visible. The superficial great saphenous vein was also interrogated. Spectral Doppler was utilized to evaluate flow at rest and with distal augmentation maneuvers in the common femoral, femoral and popliteal veins. COMPARISON:  None. FINDINGS: RIGHT LOWER EXTREMITY Common Femoral Vein: No evidence of thrombus. Normal compressibility, respiratory phasicity and response to augmentation. Saphenofemoral Junction: No evidence of thrombus. Normal compressibility and flow on color Doppler imaging. Profunda Femoral Vein: No evidence of thrombus. Normal compressibility and flow on color Doppler imaging. Femoral Vein: No evidence of thrombus. Normal compressibility, respiratory phasicity and response to augmentation. Popliteal Vein: No evidence of thrombus. Normal compressibility, respiratory phasicity and response to augmentation. Calf Veins: No evidence of thrombus. Normal compressibility and flow on color Doppler imaging. Superficial Great Saphenous Vein: No evidence of thrombus. Normal compressibility. Venous Reflux:  None. Other Findings: No evidence of superficial thrombophlebitis or abnormal fluid collection. LEFT LOWER EXTREMITY Common Femoral Vein: No evidence of thrombus. Normal compressibility, respiratory phasicity and response to augmentation. Saphenofemoral Junction: No evidence of thrombus. Normal compressibility and flow on color Doppler imaging. Profunda Femoral Vein: No evidence of thrombus. Normal compressibility and  flow on color Doppler imaging. Femoral Vein: No evidence of thrombus. Normal compressibility, respiratory phasicity and response to augmentation. Popliteal Vein: No evidence of thrombus. Normal compressibility, respiratory phasicity and response to augmentation. Calf Veins: No evidence of thrombus. Normal compressibility and flow on color Doppler imaging. Superficial Great Saphenous Vein: No evidence of thrombus. Normal compressibility. Venous Reflux:  None. Other Findings: No evidence of superficial thrombophlebitis or abnormal fluid collection. IMPRESSION: No evidence of deep venous thrombosis in either lower extremity. Electronically Signed   By: Aletta Edouard M.D.   On: 02/20/2021 13:27    I discussed the assessment and treatment plan with the patient. The patient was provided an opportunity to ask questions and all were answered. The patient agreed with the plan and demonstrated an understanding of the instructions. The patient was advised to call back or seek an in-person evaluation if the symptoms worsen or if the condition fails to improve as anticipated.    I spent 20 minutes for the appointment reviewing test results, discuss management and coordination of care.  Heath Lark, MD 03/12/2021 1:19 PM

## 2021-03-12 NOTE — Assessment & Plan Note (Signed)
She has mild elevated liver enzymes, could be attributed to recent statin Observe closely for now

## 2021-03-13 ENCOUNTER — Telehealth: Payer: Self-pay | Admitting: Hematology and Oncology

## 2021-03-13 NOTE — Telephone Encounter (Signed)
Scheduled appt per 4/12 sch msg. Pt aware.

## 2021-03-20 ENCOUNTER — Inpatient Hospital Stay: Payer: PPO

## 2021-03-20 ENCOUNTER — Ambulatory Visit
Admission: RE | Admit: 2021-03-20 | Discharge: 2021-03-20 | Disposition: A | Discharge status: Home / Self Care | Payer: PPO | Source: Ambulatory Visit | Attending: Hematology and Oncology | Admitting: Hematology and Oncology

## 2021-03-20 ENCOUNTER — Encounter: Payer: Self-pay | Admitting: Hematology and Oncology

## 2021-03-20 ENCOUNTER — Telehealth: Payer: Self-pay | Admitting: Hematology and Oncology

## 2021-03-20 ENCOUNTER — Other Ambulatory Visit: Payer: Self-pay

## 2021-03-20 ENCOUNTER — Inpatient Hospital Stay: Payer: PPO | Admitting: Hematology and Oncology

## 2021-03-20 DIAGNOSIS — E1122 Type 2 diabetes mellitus with diabetic chronic kidney disease: Secondary | ICD-10-CM | POA: Diagnosis not present

## 2021-03-20 DIAGNOSIS — C569 Malignant neoplasm of unspecified ovary: Secondary | ICD-10-CM

## 2021-03-20 DIAGNOSIS — R922 Inconclusive mammogram: Secondary | ICD-10-CM | POA: Diagnosis not present

## 2021-03-20 DIAGNOSIS — Z853 Personal history of malignant neoplasm of breast: Secondary | ICD-10-CM

## 2021-03-20 DIAGNOSIS — D61818 Other pancytopenia: Secondary | ICD-10-CM

## 2021-03-20 DIAGNOSIS — Z452 Encounter for adjustment and management of vascular access device: Secondary | ICD-10-CM | POA: Diagnosis not present

## 2021-03-20 DIAGNOSIS — N183 Chronic kidney disease, stage 3 unspecified: Secondary | ICD-10-CM | POA: Diagnosis not present

## 2021-03-20 DIAGNOSIS — Z170 Estrogen receptor positive status [ER+]: Secondary | ICD-10-CM

## 2021-03-20 LAB — CBC WITH DIFFERENTIAL (CANCER CENTER ONLY)
Abs Immature Granulocytes: 0.01 10*3/uL (ref 0.00–0.07)
Basophils Absolute: 0 10*3/uL (ref 0.0–0.1)
Basophils Relative: 1 %
Eosinophils Absolute: 0.1 10*3/uL (ref 0.0–0.5)
Eosinophils Relative: 1 %
HCT: 33.5 % — ABNORMAL LOW (ref 36.0–46.0)
Hemoglobin: 10.8 g/dL — ABNORMAL LOW (ref 12.0–15.0)
Immature Granulocytes: 0 %
Lymphocytes Relative: 25 %
Lymphs Abs: 1.1 10*3/uL (ref 0.7–4.0)
MCH: 31.3 pg (ref 26.0–34.0)
MCHC: 32.2 g/dL (ref 30.0–36.0)
MCV: 97.1 fL (ref 80.0–100.0)
Monocytes Absolute: 0.5 10*3/uL (ref 0.1–1.0)
Monocytes Relative: 10 %
Neutro Abs: 2.9 10*3/uL (ref 1.7–7.7)
Neutrophils Relative %: 63 %
Platelet Count: 111 10*3/uL — ABNORMAL LOW (ref 150–400)
RBC: 3.45 MIL/uL — ABNORMAL LOW (ref 3.87–5.11)
RDW: 16.8 % — ABNORMAL HIGH (ref 11.5–15.5)
WBC Count: 4.6 10*3/uL (ref 4.0–10.5)
nRBC: 0 % (ref 0.0–0.2)

## 2021-03-20 LAB — CMP (CANCER CENTER ONLY)
ALT: 25 U/L (ref 0–44)
AST: 35 U/L (ref 15–41)
Albumin: 4 g/dL (ref 3.5–5.0)
Alkaline Phosphatase: 130 U/L — ABNORMAL HIGH (ref 38–126)
Anion gap: 11 (ref 5–15)
BUN: 28 mg/dL — ABNORMAL HIGH (ref 8–23)
CO2: 27 mmol/L (ref 22–32)
Calcium: 9.9 mg/dL (ref 8.9–10.3)
Chloride: 104 mmol/L (ref 98–111)
Creatinine: 1.34 mg/dL — ABNORMAL HIGH (ref 0.44–1.00)
GFR, Estimated: 40 mL/min — ABNORMAL LOW (ref >60)
Glucose, Bld: 102 mg/dL — ABNORMAL HIGH (ref 70–99)
Potassium: 3.7 mmol/L (ref 3.5–5.1)
Sodium: 142 mmol/L (ref 135–145)
Total Bilirubin: 0.5 mg/dL (ref 0.3–1.2)
Total Protein: 7.2 g/dL (ref 6.5–8.1)

## 2021-03-20 NOTE — Telephone Encounter (Signed)
Scheduled appts per 4/20 sch msg. Pt aware.

## 2021-03-20 NOTE — Assessment & Plan Note (Signed)
Her pancytopenia has improved She is not symptomatic We will resume treatment I have checked her serum vitamin B12 and iron studies several months ago and they were adequate

## 2021-03-20 NOTE — Progress Notes (Signed)
HEMATOLOGY-ONCOLOGY ELECTRONIC VISIT PROGRESS NOTE  Patient Care Team: [name], MD as PCP - Kinnie Scales, [place] as Referring Physician (Optometry) [name], MD as Consulting Physician (Dermatology) Latanya Maudlin, MD as Consulting Physician (Orthopedic Surgery) Inda Castle, MD (Inactive) as Consulting Physician (Gastroenterology) Awanda Mink Craige Cotta, RN as Oncology Nurse Navigator (Oncology) Heath Lark, MD as Consulting Physician (Hematology and Oncology) Madelon Lips, MD as Consulting Physician (Nephrology)  I connected with  the patient by telephone today.   ASSESSMENT & PLAN:  Ovarian cancer ([place]) Since she took a brief break from olaparib, her pancytopenia and renal function has improved We will resume olaparib today I have schedule appointment for 04/01/21 to review test results again and for toxicity check  Pancytopenia, acquired [facility]) Her pancytopenia has improved She is not symptomatic We will resume treatment I have checked her serum vitamin B12 and iron studies several months ago and they were adequate  CKD stage 3 due to type 2 diabetes mellitus ([place]) Her serum creatinine has improved I recommend she continues on fluid hydration   No orders of the defined types were placed in this encounter.   INTERVAL HISTORY: Please see below for problem oriented charting. The purpose of today's visit is to review blood test results She took a break from treatment due to worsening pancytopenia and elevated LFT a week ago Since then, she has been feeling fine Her younger sister was diagnosed with lung cancer and she is wondering whether this could be inheritable I told the patient and reviewed her genetic results and told her this is unrelated to her condition  SUMMARY OF ONCOLOGIC HISTORY: Oncology History Overview Note  HRD positive   Breast cancer of upper-inner quadrant of left female breast ([place])  02/26/2016 Initial Diagnosis   Left breast biopsy  11:00 position: invasive ductal carcinoma with DCIS, ER 90%, PR 10%, HER-2 negative, Ki-67 30%, grade 2, 2.2 cm palpable lesion T2 N0 stage II a clinical stage   03/18/2016 Surgery   Left lumpectomy: Invasive ductal carcinoma, grade 2, 6.3 cm, with high-grade DCIS, margins negative, 0/4 lymph nodes negative, ER 90%, via 10%, HER-2 negative ratio 0.97, Ki-67 30%, T3 N0 stage IIB   03/25/2016 Procedure   Genetic testing is negative for pathogenic mutations within any of the 20 Genes on the breast/ovarian cancer panel   04/04/2016 Oncotype testing   Oncotype DX recurrence score 37, 25% 10 year distant risk of recurrence   04/17/2016 - 07/31/2016 Chemotherapy   Adjuvant chemotherapy with dose dense Adriamycin and Cytoxan followed by Abraxane weekly 8 ( discontinued due to neuropathy)   09/01/2016 - 09/26/2016 Radiation Therapy   Adj XRT 1) Left breast: 42.5 Gy in 17 fractions. 2) Left breast boost: 7.5 Gy in 3 fractions.   12/02/2016 -  Anti-estrogen oral therapy   Anastrozole 1 mg daily   Ovarian cancer ([place])  04/03/2020 Imaging   US pelvis Complex cystic and solid mass in LEFT adnexa 12.5 cm diameter question cystic ovarian neoplasm; recommend correlation with serum tumor markers and further evaluation by MR imaging with and without contrast.     04/04/2020 Imaging   US venous Doppler No evidence of deep venous thrombosis in the right lower extremity. Left common femoral vein also patent.   04/15/2020 Imaging   MRI pelvis 1. Large complex solid and cystic mass arising from the right adnexa measuring 10.8 by 11.5 by 11.1 cm. This has an aggressive appearance with extensive enhancing mural soft tissue components. Findings are highly suspicious  for malignant ovarian neoplasm. 2. Extensive bilateral retroperitoneal and bilateral iliac adenopathy compatible with metastatic disease. 3. Signs of extensive peritoneal carcinomatosis including ascites, enhancement, thickening and nodularity of the  peritoneal reflections, omental caking and bulky peritoneal nodularity. 4. Suspected serosal involvement of the dome of bladder with loss of normal fat plane. Cannot rule out mural invasion by tumor.   04/17/2020 Tumor Marker   Patient's tumor was tested for the following markers: CA-125 Results of the tumor marker test revealed 1762.   04/20/2020 Cancer Staging   Staging form: Ovary, Fallopian Tube, and Primary Peritoneal Carcinoma, AJCC 8th Edition - Clinical: FIGO Stage IIIC (cT3, cN1, cM0) - Signed by Heath Lark, MD on 04/20/2020   04/23/2020 Imaging   1. Redemonstrated dominant mixed solid and cystic mass arising from the vicinity of the right ovary measuring at least 11.5 x 10.7 cm, not significantly changed compared to prior MR, and consistent with primary ovarian malignancy.  2. Numerous bulky retroperitoneal, bilateral iliac, and pelvic sidewall lymph nodes. 3. Moderate volume ascites throughout the abdomen and pelvis with subtle thickening and nodularity throughout the peritoneum, and extensive bulky nodular metastatic disease of the omentum. 4. Constellation of findings is consistent with advanced nodal and peritoneal metastatic disease. 5. There are prominent subcentimeter epicardial lymph nodes, nonspecific although suspicious for nodal metastatic disease. No definite nodal metastatic disease in the chest. Attention on follow-up. 6. There are multiple small subpleural nodules at the right lung base overlying the diaphragm, measuring up to 7 mm. These are generally nonspecific and less favored to represent pulmonary metastatic disease given distribution. Attention on follow-up. 7. Somewhat coarse contour of the liver, suggestive of cirrhosis, although out without overt morphologic stigmata. 8. Aortic Atherosclerosis (ICD10-I70.0).     04/24/2020 Procedure   Successful placement of a right internal jugular approach power injectable Port-A-Cath. The catheter is ready for immediate  use.     05/02/2020 Tumor Marker   Patient's tumor was tested for the following markers: CA-125 Results of the tumor marker test revealed 1921   05/03/2020 - 12/12/2020 Chemotherapy   The patient had carboplatin and taxol for chemotherapy treatment.     05/08/2020 Procedure   Successful ultrasound-guided paracentesis yielding 2.6 liters of peritoneal fluid.   05/16/2020 Procedure   Successful ultrasound-guided paracentesis yielding 3.2 liters of peritoneal fluid   05/25/2020 Procedure   Successful ultrasound-guided paracentesis yielding 3 liters of peritoneal fluid.     06/01/2020 Procedure   Successful ultrasound-guided therapeutic paracentesis yielding 1.7 liters of peritoneal fluid.   06/14/2020 Tumor Marker   Patient's tumor was tested for the following markers: CA-125 Results of the tumor marker test revealed 1309   06/20/2020 Imaging   1. Dominant mixed cystic and solid mass in the central pelvis is stable in the interval. 2. Clear interval decrease in retroperitoneal and pelvic sidewall lymphadenopathy. The bulky omental disease has also clearly decreased in the interval. 3. Interval decrease in ascites. 4. Stable 8 mm subpleural nodule along the diaphragm. 5. Aortic Atherosclerosis (ICD10-I70.0).   07/10/2020 Tumor Marker   Patient's tumor was tested for the following markers: CA-125 Results of the tumor marker test revealed 220   08/03/2020 Tumor Marker   Patient's tumor was tested for the following markers: CA-125 Results of the tumor marker test revealed 53.3.   09/03/2020 Tumor Marker   Patient's tumor was tested for the following markers: CA-125 Results of the tumor marker test revealed 29.7   09/20/2020 Imaging   1. Substantial improvement. The right ovarian  cystic and solid mass is half the volume that it measured on 06/20/2020. Similar reduction in the bulk of the omental caking of tumor. Prior ascites is resolved and the retroperitoneal adenopathy is markedly  improved. 2. Other imaging findings of potential clinical significance: Stable pleural-based nodularity along the right hemidiaphragm measuring about 1.1 by 0.7 by 0.3 cm. Stable hypodense lesion of the right kidney upper pole, probably a cyst although the configuration of this lesion makes it difficult to obtain accurate density measurements. Left ovarian cyst, stable. Chondrocalcinosis involving the acetabular labra. Mild lumbar spondylosis and degenerative disc disease. 3. Aortic atherosclerosis.     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 (stage ypT3c), predominantly viable with focal fibrosis and hemosiderin laden macrophages (possible mild treatment effect)  B: Ovary and fallopian tube, right, salpingo-oophorectomy - High grade serous carcinoma involving right ovary and fallopian tube with surface involvement, size up to 8 cm  - Areas of necrosis and hemosiderin laden macrophages suggestive of treatment effect - 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 - See synoptic report and comment  C: Ovary and fallopian tube, left, salpingo-oophorectomy - High grade serous carcinoma involving the left fallopian tube, size up to 0.5 cm - Ovary with no definite involvement by carcinoma identified - Nodular area of endometriosis and peritoneal inclusion cyst also present - See synoptic report and comment   Procedure:    Bilateral salpingo-oophorectomy    Procedure:    Omentectomy    Specimen Integrity of Right Ovary:    Intact with surface involvement by tumor    Specimen Integrity of Left Ovary:    Capsule intact   TUMOR Tumor Site:    Right fallopian tube: favor as primary site; also involves right ovary and left fallopian  tube  Histologic Type:    Serous carcinoma  Histologic Grade:    High grade  Tumor Size:    Greatest Dimension (Centimeters): in fallopian tube: 1.0 cm; in ovary: 8.0 cm Ovarian Surface Involvement:    Present    Laterality:    Right  Fallopian Tube Surface Involvement:    Present    Laterality:    Bilateral  Other Tissue / Organ Involvement:    Right ovary  Other Tissue / Organ Involvement:    Right fallopian tube  Other Tissue / Organ Involvement:    Left fallopian tube  Other Tissue / Organ Involvement:    Omentum  Largest Extrapelvic Peritoneal Focus:    Macroscopic (greater than 2 cm)  Peritoneal / Ascitic Fluid:    Not submitted / unknown  Pleural Fluid:    Not submitted / unknown  Treatment Effect:    No definite or minimal response identified (chemotherapy response score [CRS] 1)   LYMPH NODES Regional Lymph Nodes:    No lymph nodes submitted or found   PATHOLOGIC STAGE CLASSIFICATION (pTNM, AJCC 8th Edition) TNM Descriptors:    y (post-treatment)  Primary Tumor (pT):    pT3c  Regional Lymph Nodes (pN):    pNX   FIGO STAGE FIGO Stage:    IIIC   ADDITIONAL FINDINGS Additional Findings:    Serous tubal intraepithelial carcinoma (STIC)  Additional Findings:    Left ovary with no definite carcinoma identified, left-sided nodule of endometriosis and peritoneal inclusion cyst    10/18/2020 - 10/20/2020  Hospital Admission   She was admitted to [facility] for interval debulking surgery   11/19/2020 Tumor Marker   Patient's tumor was tested for the following markers: CA-125. Results of the tumor marker test revealed 38.3   12/12/2020 Tumor Marker   Patient's tumor was tested for the following markers CA-125. Results of the tumor marker test revealed 42.3.   01/02/2021 Tumor Marker   Patient's tumor was tested for the following markers: CA-125. Results of the tumor marker test revealed 37.3   01/29/2021 Tumor Marker   Patient's tumor was tested for the following markers:  CA-125 Results of the tumor marker test revealed 44.3   01/29/2021 Imaging   1. Interval increase in loculated appearing ascites in the low central abdomen and pelvis. 2. There is extensive peritoneal nodularity surrounding this fluid, the nodularity itself not appreciably changed in appearance compared to prior examination. 3. Additional peritoneal nodularity and/or mesenteric lymph nodes are unchanged. 4. Interval decrease in size of a low-attenuation nodule overlying the right hemidiaphragm, now measuring 1.2 cm, previously 1.8 cm when measured similarly. Findings are consistent with treatment response of a metastatic nodule. No other evidence of intrathoracic metastatic disease. 5. Status post hysterectomy, oophorectomy, and omentectomy.   01/31/2021 -  Chemotherapy   She is started on Olaparib       03/04/2021 Tumor Marker   Patient's tumor was tested for the following markers: CA-125 Results of the tumor marker test revealed 39.7     REVIEW OF SYSTEMS:   Constitutional: Denies fevers, chills or abnormal weight loss Eyes: Denies blurriness of vision Ears, nose, mouth, throat, and face: Denies mucositis or sore throat Respiratory: Denies cough, dyspnea or wheezes Cardiovascular: Denies palpitation, chest discomfort Gastrointestinal:  Denies nausea, heartburn or change in bowel habits Skin: Denies abnormal skin rashes Lymphatics: Denies new lymphadenopathy or easy bruising Neurological:Denies numbness, tingling or new weaknesses Behavioral/Psych: Mood is stable, no new changes  Extremities: No lower extremity edema All other systems were reviewed with the patient and are negative.  I have reviewed the past medical history, past surgical history, social history and family history with the patient and they are unchanged from previous note.  ALLERGIES:  is allergic to keflex [cephalexin], meloxicam, prednisone, premarin [estrogens conjugated], and trovan  [alatrofloxacin].  MEDICATIONS:  Current Outpatient Medications  Medication Sig Dispense Refill  . acetaminophen (TYLENOL) 325 MG tablet Take 325 mg by mouth every 6 (six) hours as needed for headache.    . allopurinol (ZYLOPRIM) 300 MG tablet TAKE 1 TABLET BY MOUTH DAILY FOR GOUT PREVENTION 90 tablet 1  . blood glucose meter kit and supplies KIT Dispense based on patient and insurance preference. Use to check blood sugar once daily. Dx: E11.22, N18.30 1 each 12  . Cholecalciferol (VITAMIN D3) 5000 units CAPS Take 5,000 Units by mouth daily.    . cyanocobalamin 1000 MCG tablet Take 1,000 mcg by mouth daily.    Marland Kitchen ezetimibe (ZETIA) 10 MG tablet Take 10 mg by mouth daily.    Marland Kitchen gabapentin (NEURONTIN) 300 MG capsule Take 1 capsule in the morning and afternoon and 2 capsules at night 360 capsule 3  . glimepiride (AMARYL) 1 MG tablet Take 1 tab by mouth in the morning if fasting is running 150+. Please do not take this medication if you are ill or not eating as this can cause a low blood sugar. 90 tablet 1  . glucose blood (FREESTYLE TEST STRIPS) test strip Check Blood Sugar Daily as directed (Dx: E11.29) 100  strip 3  . hydrochlorothiazide (HYDRODIURIL) 25 MG tablet Take  1 tablet  Daily  for BP and Fluid Retention /Ankle Swelling 90 tablet 0  . ketoconazole (NIZORAL) 2 % cream Apply 1 application topically 2 (two) times daily. To groin rash 60 g 0  . Lancets (FREESTYLE) lancets CHECK BLOOD SUGAR DAILY AS DIRECTED 100 each 3  . lidocaine-prilocaine (EMLA) cream Apply to affected area once (Patient taking differently: Apply 1 application topically daily as needed (port access).) 30 g 3  . loratadine (CLARITIN) 10 MG tablet Take 10 mg by mouth daily as needed for allergies.    . Magnesium 250 MG TABS Take 250 mg by mouth every other day.    . metoprolol succinate (TOPROL-XL) 25 MG 24 hr tablet TAKE 1 TABLET BY MOUTH DAILY FOR BLOOD PRESSURE (Patient taking differently: Take 25 mg by mouth daily.) 90  tablet 3  . olaparib (LYNPARZA) 100 MG tablet Take 1 tablet (100 mg total) by mouth 2 (two) times daily. Swallow whole. May take with food to decrease nausea and vomiting. 60 tablet 11  . omeprazole (PRILOSEC) 20 MG capsule Take 1 capsule (20 mg total) by mouth daily. 30 capsule 11  . ondansetron (ZOFRAN) 8 MG tablet Take 1 tablet (8 mg total) by mouth 2 (two) times daily as needed for refractory nausea / vomiting. Start on day 3 after carboplatin chemo. 30 tablet 1  . prochlorperazine (COMPAZINE) 10 MG tablet Take 1 tablet (10 mg total) by mouth every 6 (six) hours as needed for nausea or vomiting. 60 tablet 9  . rosuvastatin (CRESTOR) 5 MG tablet Take 1 tablet (5 mg total) by mouth 3 (three) times a week. In the evening for cholesterol. 36 tablet 3  . vitamin C (ASCORBIC ACID) 500 MG tablet Take 500 mg by mouth every evening.      No current facility-administered medications for this visit.    PHYSICAL EXAMINATION: ECOG PERFORMANCE STATUS: 0 - Asymptomatic  LABORATORY DATA:  I have reviewed the data as listed CMP Latest Ref Rng & Units 03/20/2021 03/12/2021 03/04/2021  Glucose 70 - 99 mg/dL 102(H) 111(H) 87  BUN 8 - 23 mg/dL 28(H) 23 25(H)  Creatinine 0.44 - 1.00 mg/dL 1.34(H) 1.64(H) 1.86(H)  Sodium 135 - 145 mmol/L 142 144 143  Potassium 3.5 - 5.1 mmol/L 3.7 4.0 4.0  Chloride 98 - 111 mmol/L 104 108 109  CO2 22 - 32 mmol/L _0 Calcium 8.9 - 10.3 mg/dL 9.9 10.1 10.1  Total Protein 6.5 - 8.1 g/dL 7.2 6.8 7.2  Total Bilirubin 0.3 - 1.2 mg/dL 0.5 0.6 0.4  Alkaline Phos 38 - 126 U/L 130(H) 138(H) 124  AST 15 - 41 U/L 35 49(H) 42(H)  ALT 0 - 44 U/L 25 57(H) 25    Lab Results  Component Value Date   WBC 4.6 03/20/2021   HGB 10.8 (L) 03/20/2021   HCT 33.5 (L) 03/20/2021   MCV 97.1 03/20/2021   PLT 111 (L) 03/20/2021   NEUTROABS 2.9 03/20/2021     RADIOGRAPHIC STUDIES: I have personally reviewed the radiological images as listed and agreed with the findings in the report. US  Venous Img Lower Bilateral (DVT)  Result Date: 02/20/2021 CLINICAL DATA:  Lower extremity edema, primarily of the right lower extremity. EXAM: BILATERAL LOWER EXTREMITY VENOUS DOPPLER ULTRASOUND TECHNIQUE: Gray-scale sonography with graded compression, as well as color Doppler and duplex ultrasound were performed to evaluate the lower extremity deep venous systems from the level of the  common femoral vein and including the common femoral, femoral, profunda femoral, popliteal and calf veins including the posterior tibial, peroneal and gastrocnemius veins when visible. The superficial great saphenous vein was also interrogated. Spectral Doppler was utilized to evaluate flow at rest and with distal augmentation maneuvers in the common femoral, femoral and popliteal veins. COMPARISON:  None. FINDINGS: RIGHT LOWER EXTREMITY Common Femoral Vein: No evidence of thrombus. Normal compressibility, respiratory phasicity and response to augmentation. Saphenofemoral Junction: No evidence of thrombus. Normal compressibility and flow on color Doppler imaging. Profunda Femoral Vein: No evidence of thrombus. Normal compressibility and flow on color Doppler imaging. Femoral Vein: No evidence of thrombus. Normal compressibility, respiratory phasicity and response to augmentation. Popliteal Vein: No evidence of thrombus. Normal compressibility, respiratory phasicity and response to augmentation. Calf Veins: No evidence of thrombus. Normal compressibility and flow on color Doppler imaging. Superficial Great Saphenous Vein: No evidence of thrombus. Normal compressibility. Venous Reflux:  None. Other Findings: No evidence of superficial thrombophlebitis or abnormal fluid collection. LEFT LOWER EXTREMITY Common Femoral Vein: No evidence of thrombus. Normal compressibility, respiratory phasicity and response to augmentation. Saphenofemoral Junction: No evidence of thrombus. Normal compressibility and flow on color Doppler imaging.  Profunda Femoral Vein: No evidence of thrombus. Normal compressibility and flow on color Doppler imaging. Femoral Vein: No evidence of thrombus. Normal compressibility, respiratory phasicity and response to augmentation. Popliteal Vein: No evidence of thrombus. Normal compressibility, respiratory phasicity and response to augmentation. Calf Veins: No evidence of thrombus. Normal compressibility and flow on color Doppler imaging. Superficial Great Saphenous Vein: No evidence of thrombus. Normal compressibility. Venous Reflux:  None. Other Findings: No evidence of superficial thrombophlebitis or abnormal fluid collection. IMPRESSION: No evidence of deep venous thrombosis in either lower extremity. Electronically Signed   By: Aletta Edouard M.D.   On: 02/20/2021 13:27    I discussed the assessment and treatment plan with the patient. The patient was provided an opportunity to ask questions and all were answered. The patient agreed with the plan and demonstrated an understanding of the instructions. The patient was advised to call back or seek an in-person evaluation if the symptoms worsen or if the condition fails to improve as anticipated.    I spent 20 minutes for the appointment reviewing test results, discuss management and coordination of care.  Heath Lark, MD 03/20/2021 12:14 PM

## 2021-03-20 NOTE — Assessment & Plan Note (Signed)
Her serum creatinine has improved I recommend she continues on fluid hydration

## 2021-03-20 NOTE — Assessment & Plan Note (Signed)
Since she took a brief break from olaparib, her pancytopenia and renal function has improved We will resume olaparib today I have schedule appointment for 04/01/21 to review test results again and for toxicity check

## 2021-03-29 ENCOUNTER — Other Ambulatory Visit: Payer: Self-pay | Admitting: Hematology and Oncology

## 2021-03-29 DIAGNOSIS — C50212 Malignant neoplasm of upper-inner quadrant of left female breast: Secondary | ICD-10-CM

## 2021-03-29 DIAGNOSIS — C569 Malignant neoplasm of unspecified ovary: Secondary | ICD-10-CM

## 2021-04-01 ENCOUNTER — Other Ambulatory Visit: Payer: Self-pay

## 2021-04-01 ENCOUNTER — Inpatient Hospital Stay: Payer: PPO | Attending: Gynecologic Oncology

## 2021-04-01 ENCOUNTER — Encounter: Payer: Self-pay | Admitting: Hematology and Oncology

## 2021-04-01 ENCOUNTER — Inpatient Hospital Stay: Payer: PPO | Admitting: Hematology and Oncology

## 2021-04-01 DIAGNOSIS — C561 Malignant neoplasm of right ovary: Secondary | ICD-10-CM | POA: Insufficient documentation

## 2021-04-01 DIAGNOSIS — Z79899 Other long term (current) drug therapy: Secondary | ICD-10-CM | POA: Insufficient documentation

## 2021-04-01 DIAGNOSIS — E1122 Type 2 diabetes mellitus with diabetic chronic kidney disease: Secondary | ICD-10-CM | POA: Diagnosis not present

## 2021-04-01 DIAGNOSIS — Z853 Personal history of malignant neoplasm of breast: Secondary | ICD-10-CM | POA: Diagnosis not present

## 2021-04-01 DIAGNOSIS — C786 Secondary malignant neoplasm of retroperitoneum and peritoneum: Secondary | ICD-10-CM | POA: Insufficient documentation

## 2021-04-01 DIAGNOSIS — R7989 Other specified abnormal findings of blood chemistry: Secondary | ICD-10-CM | POA: Insufficient documentation

## 2021-04-01 DIAGNOSIS — Z90721 Acquired absence of ovaries, unilateral: Secondary | ICD-10-CM | POA: Insufficient documentation

## 2021-04-01 DIAGNOSIS — D61818 Other pancytopenia: Secondary | ICD-10-CM | POA: Insufficient documentation

## 2021-04-01 DIAGNOSIS — C569 Malignant neoplasm of unspecified ovary: Secondary | ICD-10-CM

## 2021-04-01 DIAGNOSIS — N183 Chronic kidney disease, stage 3 unspecified: Secondary | ICD-10-CM | POA: Diagnosis not present

## 2021-04-01 DIAGNOSIS — C50212 Malignant neoplasm of upper-inner quadrant of left female breast: Secondary | ICD-10-CM

## 2021-04-01 LAB — CBC WITH DIFFERENTIAL/PLATELET
Abs Immature Granulocytes: 0.01 10*3/uL (ref 0.00–0.07)
Basophils Absolute: 0 10*3/uL (ref 0.0–0.1)
Basophils Relative: 1 %
Eosinophils Absolute: 0.1 10*3/uL (ref 0.0–0.5)
Eosinophils Relative: 2 %
HCT: 32.5 % — ABNORMAL LOW (ref 36.0–46.0)
Hemoglobin: 10.6 g/dL — ABNORMAL LOW (ref 12.0–15.0)
Immature Granulocytes: 0 %
Lymphocytes Relative: 26 %
Lymphs Abs: 0.9 10*3/uL (ref 0.7–4.0)
MCH: 32.3 pg (ref 26.0–34.0)
MCHC: 32.6 g/dL (ref 30.0–36.0)
MCV: 99.1 fL (ref 80.0–100.0)
Monocytes Absolute: 0.4 10*3/uL (ref 0.1–1.0)
Monocytes Relative: 10 %
Neutro Abs: 2.3 10*3/uL (ref 1.7–7.7)
Neutrophils Relative %: 61 %
Platelets: 77 10*3/uL — ABNORMAL LOW (ref 150–400)
RBC: 3.28 MIL/uL — ABNORMAL LOW (ref 3.87–5.11)
RDW: 16 % — ABNORMAL HIGH (ref 11.5–15.5)
WBC: 3.7 10*3/uL — ABNORMAL LOW (ref 4.0–10.5)
nRBC: 0 % (ref 0.0–0.2)

## 2021-04-01 LAB — COMPREHENSIVE METABOLIC PANEL
ALT: 33 U/L (ref 0–44)
AST: 46 U/L — ABNORMAL HIGH (ref 15–41)
Albumin: 3.8 g/dL (ref 3.5–5.0)
Alkaline Phosphatase: 126 U/L (ref 38–126)
Anion gap: 10 (ref 5–15)
BUN: 18 mg/dL (ref 8–23)
CO2: 29 mmol/L (ref 22–32)
Calcium: 9.8 mg/dL (ref 8.9–10.3)
Chloride: 105 mmol/L (ref 98–111)
Creatinine, Ser: 1.51 mg/dL — ABNORMAL HIGH (ref 0.44–1.00)
GFR, Estimated: 34 mL/min — ABNORMAL LOW (ref >60)
Glucose, Bld: 141 mg/dL — ABNORMAL HIGH (ref 70–99)
Potassium: 3.8 mmol/L (ref 3.5–5.1)
Sodium: 144 mmol/L (ref 135–145)
Total Bilirubin: 0.4 mg/dL (ref 0.3–1.2)
Total Protein: 6.8 g/dL (ref 6.5–8.1)

## 2021-04-01 NOTE — Assessment & Plan Note (Signed)
She has severe recurrent pancytopenia again Thankfully, she is not symptomatic We will hold treatment this week I explained to the patient why transfusion is not indicated

## 2021-04-01 NOTE — Assessment & Plan Note (Signed)
She has small intermittent elevated serum creatinine Observe closely for now

## 2021-04-01 NOTE — Assessment & Plan Note (Signed)
Unfortunately, she developed severe pancytopenia again She is not symptomatic I gave the patient instruction to hold olaparib Plan to recheck her CBC next week The patient had numerous interruptions of treatment due to side effects with pancytopenia If her pancytopenia recurred again with reintroduction of olaparib next week, we might have to discontinue olaparib permanently

## 2021-04-01 NOTE — Progress Notes (Signed)
HEMATOLOGY-ONCOLOGY ELECTRONIC VISIT PROGRESS NOTE  Patient Care Team: [name], MD as PCP - Kinnie Scales, [place] as Referring Physician (Optometry) [name], MD as Consulting Physician (Dermatology) Latanya Maudlin, MD as Consulting Physician (Orthopedic Surgery) Inda Castle, MD (Inactive) as Consulting Physician (Gastroenterology) Awanda Mink Craige Cotta, RN as Oncology Nurse Navigator (Oncology) Heath Lark, MD as Consulting Physician (Hematology and Oncology) Madelon Lips, MD as Consulting Physician (Nephrology)  I connected with by [facility] video conference and verified that I am speaking with the correct person using two identifiers.  I discussed the limitations, risks, security and privacy concerns of performing an evaluation and management service by [facility] and the availability of in person appointments.  I also discussed with the patient that there may be a patient responsible charge related to this service. The patient expressed understanding and agreed to proceed.   ASSESSMENT & PLAN:  Ovarian cancer ([place]) Unfortunately, she developed severe pancytopenia again She is not symptomatic I gave the patient instruction to hold olaparib Plan to recheck her CBC next week The patient had numerous interruptions of treatment due to side effects with pancytopenia If her pancytopenia recurred again with reintroduction of olaparib next week, we might have to discontinue olaparib permanently  Pancytopenia, acquired ([place]) She has severe recurrent pancytopenia again Thankfully, she is not symptomatic We will hold treatment this week I explained to the patient why transfusion is not indicated  CKD stage 3 due to type 2 diabetes mellitus ([place]) She has small intermittent elevated serum creatinine Observe closely for now   No orders of the defined types were placed in this encounter.   INTERVAL HISTORY: Please see below for problem oriented charting. The purpose of today's  visit is for toxicity review while patient is on PARP inhibitor She feels well No nausea Denies abdominal pain no changes in bowel habits The patient denies any recent signs or symptoms of bleeding such as spontaneous epistaxis, hematuria or hematochezia.  SUMMARY OF ONCOLOGIC HISTORY: Oncology History Overview Note  HRD positive   Breast cancer of upper-inner quadrant of left female breast ([place])  02/26/2016 Initial Diagnosis   Left breast biopsy 11:00 position: invasive ductal carcinoma with DCIS, ER 90%, PR 10%, HER-2 negative, Ki-67 30%, grade 2, 2.2 cm palpable lesion T2 N0 stage II a clinical stage   03/18/2016 Surgery   Left lumpectomy: Invasive ductal carcinoma, grade 2, 6.3 cm, with high-grade DCIS, margins negative, 0/4 lymph nodes negative, ER 90%, via 10%, HER-2 negative ratio 0.97, Ki-67 30%, T3 N0 stage IIB   03/25/2016 Procedure   Genetic testing is negative for pathogenic mutations within any of the 20 Genes on the breast/ovarian cancer panel   04/04/2016 Oncotype testing   Oncotype DX recurrence score 37, 25% 10 year distant risk of recurrence   04/17/2016 - 07/31/2016 Chemotherapy   Adjuvant chemotherapy with dose dense Adriamycin and Cytoxan followed by Abraxane weekly 8 ( discontinued due to neuropathy)   09/01/2016 - 09/26/2016 Radiation Therapy   Adj XRT 1) Left breast: 42.5 Gy in 17 fractions. 2) Left breast boost: 7.5 Gy in 3 fractions.   12/02/2016 -  Anti-estrogen oral therapy   Anastrozole 1 mg daily   Ovarian cancer ([place])  04/03/2020 Imaging   US pelvis Complex cystic and solid mass in LEFT adnexa 12.5 cm diameter question cystic ovarian neoplasm; recommend correlation with serum tumor markers and further evaluation by MR imaging with and without contrast.     04/04/2020 Imaging   US venous Doppler No evidence  of deep venous thrombosis in the right lower extremity. Left common femoral vein also patent.   04/15/2020 Imaging   MRI pelvis 1. Large complex solid  and cystic mass arising from the right adnexa measuring 10.8 by 11.5 by 11.1 cm. This has an aggressive appearance with extensive enhancing mural soft tissue components. Findings are highly suspicious for malignant ovarian neoplasm. 2. Extensive bilateral retroperitoneal and bilateral iliac adenopathy compatible with metastatic disease. 3. Signs of extensive peritoneal carcinomatosis including ascites, enhancement, thickening and nodularity of the peritoneal reflections, omental caking and bulky peritoneal nodularity. 4. Suspected serosal involvement of the dome of bladder with loss of normal fat plane. Cannot rule out mural invasion by tumor.   04/17/2020 Tumor Marker   Patient's tumor was tested for the following markers: CA-125 Results of the tumor marker test revealed 1762.   04/20/2020 Cancer Staging   Staging form: Ovary, Fallopian Tube, and Primary Peritoneal Carcinoma, AJCC 8th Edition - Clinical: FIGO Stage IIIC (cT3, cN1, cM0) - Signed by Heath Lark, MD on 04/20/2020   04/23/2020 Imaging   1. Redemonstrated dominant mixed solid and cystic mass arising from the vicinity of the right ovary measuring at least 11.5 x 10.7 cm, not significantly changed compared to prior MR, and consistent with primary ovarian malignancy.  2. Numerous bulky retroperitoneal, bilateral iliac, and pelvic sidewall lymph nodes. 3. Moderate volume ascites throughout the abdomen and pelvis with subtle thickening and nodularity throughout the peritoneum, and extensive bulky nodular metastatic disease of the omentum. 4. Constellation of findings is consistent with advanced nodal and peritoneal metastatic disease. 5. There are prominent subcentimeter epicardial lymph nodes, nonspecific although suspicious for nodal metastatic disease. No definite nodal metastatic disease in the chest. Attention on follow-up. 6. There are multiple small subpleural nodules at the right lung base overlying the diaphragm, measuring up to 7  mm. These are generally nonspecific and less favored to represent pulmonary metastatic disease given distribution. Attention on follow-up. 7. Somewhat coarse contour of the liver, suggestive of cirrhosis, although out without overt morphologic stigmata. 8. Aortic Atherosclerosis (ICD10-I70.0).     04/24/2020 Procedure   Successful placement of a right internal jugular approach power injectable Port-A-Cath. The catheter is ready for immediate use.     05/02/2020 Tumor Marker   Patient's tumor was tested for the following markers: CA-125 Results of the tumor marker test revealed 1921   05/03/2020 - 12/12/2020 Chemotherapy   The patient had carboplatin and taxol for chemotherapy treatment.     05/08/2020 Procedure   Successful ultrasound-guided paracentesis yielding 2.6 liters of peritoneal fluid.   05/16/2020 Procedure   Successful ultrasound-guided paracentesis yielding 3.2 liters of peritoneal fluid   05/25/2020 Procedure   Successful ultrasound-guided paracentesis yielding 3 liters of peritoneal fluid.     06/01/2020 Procedure   Successful ultrasound-guided therapeutic paracentesis yielding 1.7 liters of peritoneal fluid.   06/14/2020 Tumor Marker   Patient's tumor was tested for the following markers: CA-125 Results of the tumor marker test revealed 1309   06/20/2020 Imaging   1. Dominant mixed cystic and solid mass in the central pelvis is stable in the interval. 2. Clear interval decrease in retroperitoneal and pelvic sidewall lymphadenopathy. The bulky omental disease has also clearly decreased in the interval. 3. Interval decrease in ascites. 4. Stable 8 mm subpleural nodule along the diaphragm. 5. Aortic Atherosclerosis (ICD10-I70.0).   07/10/2020 Tumor Marker   Patient's tumor was tested for the following markers: CA-125 Results of the tumor marker test revealed 220  08/03/2020 Tumor Marker   Patient's tumor was tested for the following markers: CA-125 Results of the tumor  marker test revealed 53.3.   09/03/2020 Tumor Marker   Patient's tumor was tested for the following markers: CA-125 Results of the tumor marker test revealed 29.7   09/20/2020 Imaging   1. Substantial improvement. The right ovarian cystic and solid mass is half the volume that it measured on 06/20/2020. Similar reduction in the bulk of the omental caking of tumor. Prior ascites is resolved and the retroperitoneal adenopathy is markedly improved. 2. Other imaging findings of potential clinical significance: Stable pleural-based nodularity along the right hemidiaphragm measuring about 1.1 by 0.7 by 0.3 cm. Stable hypodense lesion of the right kidney upper pole, probably a cyst although the configuration of this lesion makes it difficult to obtain accurate density measurements. Left ovarian cyst, stable. Chondrocalcinosis involving the acetabular labra. Mild lumbar spondylosis and degenerative disc disease. 3. Aortic atherosclerosis.     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 (stage ypT3c), predominantly viable with focal fibrosis and hemosiderin laden macrophages (possible mild treatment effect)  B: Ovary and fallopian tube, right, salpingo-oophorectomy - High grade serous carcinoma involving right ovary and fallopian tube with surface involvement, size up to 8 cm  - Areas of necrosis and hemosiderin laden macrophages suggestive of treatment effect - 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 - See synoptic report and comment  C: Ovary and fallopian tube, left, salpingo-oophorectomy - High grade serous carcinoma involving the left fallopian tube, size up to 0.5 cm - Ovary with no definite involvement by carcinoma  identified - Nodular area of endometriosis and peritoneal inclusion cyst also present - See synoptic report and comment   Procedure:    Bilateral salpingo-oophorectomy    Procedure:    Omentectomy    Specimen Integrity of Right Ovary:    Intact with surface involvement by tumor    Specimen Integrity of Left Ovary:    Capsule intact   TUMOR Tumor Site:    Right fallopian tube: favor as primary site; also involves right ovary and left fallopian tube  Histologic Type:    Serous carcinoma  Histologic Grade:    High grade  Tumor Size:    Greatest Dimension (Centimeters): in fallopian tube: 1.0 cm; in ovary: 8.0 cm Ovarian Surface Involvement:    Present    Laterality:    Right  Fallopian Tube Surface Involvement:    Present    Laterality:    Bilateral  Other Tissue / Organ Involvement:    Right ovary  Other Tissue / Organ Involvement:    Right fallopian tube  Other Tissue / Organ Involvement:    Left fallopian tube  Other Tissue / Organ Involvement:    Omentum  Largest Extrapelvic Peritoneal Focus:    Macroscopic (greater than 2 cm)  Peritoneal / Ascitic Fluid:    Not submitted / unknown  Pleural Fluid:    Not submitted / unknown  Treatment Effect:    No definite or minimal response identified (chemotherapy response score [CRS] 1)   LYMPH NODES Regional Lymph Nodes:    No lymph nodes submitted or found   PATHOLOGIC STAGE CLASSIFICATION (pTNM, AJCC 8th Edition) TNM Descriptors:    y (post-treatment)  Primary Tumor (pT):    pT3c  Regional  Lymph Nodes (pN):    pNX   FIGO STAGE FIGO Stage:    IIIC   ADDITIONAL FINDINGS Additional Findings:    Serous tubal intraepithelial carcinoma (STIC)  Additional Findings:    Left ovary with no definite carcinoma identified, left-sided nodule of endometriosis and peritoneal inclusion cyst    10/18/2020 - 10/20/2020 Hospital Admission   She was admitted to [facility] for interval debulking surgery   11/19/2020 Tumor Marker   Patient's tumor  was tested for the following markers: CA-125. Results of the tumor marker test revealed 38.3   12/12/2020 Tumor Marker   Patient's tumor was tested for the following markers CA-125. Results of the tumor marker test revealed 42.3.   01/02/2021 Tumor Marker   Patient's tumor was tested for the following markers: CA-125. Results of the tumor marker test revealed 37.3   01/29/2021 Tumor Marker   Patient's tumor was tested for the following markers: CA-125 Results of the tumor marker test revealed 44.3   01/29/2021 Imaging   1. Interval increase in loculated appearing ascites in the low central abdomen and pelvis. 2. There is extensive peritoneal nodularity surrounding this fluid, the nodularity itself not appreciably changed in appearance compared to prior examination. 3. Additional peritoneal nodularity and/or mesenteric lymph nodes are unchanged. 4. Interval decrease in size of a low-attenuation nodule overlying the right hemidiaphragm, now measuring 1.2 cm, previously 1.8 cm when measured similarly. Findings are consistent with treatment response of a metastatic nodule. No other evidence of intrathoracic metastatic disease. 5. Status post hysterectomy, oophorectomy, and omentectomy.   01/31/2021 -  Chemotherapy   She is started on Olaparib       03/04/2021 Tumor Marker   Patient's tumor was tested for the following markers: CA-125 Results of the tumor marker test revealed 39.7     REVIEW OF SYSTEMS:   Constitutional: Denies fevers, chills or abnormal weight loss Eyes: Denies blurriness of vision Ears, nose, mouth, throat, and face: Denies mucositis or sore throat Respiratory: Denies cough, dyspnea or wheezes Cardiovascular: Denies palpitation, chest discomfort Gastrointestinal:  Denies nausea, heartburn or change in bowel habits Skin: Denies abnormal skin rashes Lymphatics: Denies new lymphadenopathy or easy bruising Neurological:Denies numbness, tingling or new  weaknesses Behavioral/Psych: Mood is stable, no new changes  Extremities: No lower extremity edema All other systems were reviewed with the patient and are negative.  I have reviewed the past medical history, past surgical history, social history and family history with the patient and they are unchanged from previous note.  ALLERGIES:  is allergic to keflex [cephalexin], meloxicam, prednisone, premarin [estrogens conjugated], and trovan [alatrofloxacin].  MEDICATIONS:  Current Outpatient Medications  Medication Sig Dispense Refill  . acetaminophen (TYLENOL) 325 MG tablet Take 325 mg by mouth every 6 (six) hours as needed for headache.    . allopurinol (ZYLOPRIM) 300 MG tablet TAKE 1 TABLET BY MOUTH DAILY FOR GOUT PREVENTION 90 tablet 1  . blood glucose meter kit and supplies KIT Dispense based on patient and insurance preference. Use to check blood sugar once daily. Dx: E11.22, N18.30 1 each 12  . Cholecalciferol (VITAMIN D3) 5000 units CAPS Take 5,000 Units by mouth daily.    . cyanocobalamin 1000 MCG tablet Take 1,000 mcg by mouth daily.    Marland Kitchen ezetimibe (ZETIA) 10 MG tablet Take 10 mg by mouth daily.    Marland Kitchen gabapentin (NEURONTIN) 300 MG capsule Take 1 capsule in the morning and afternoon and 2 capsules at night 360 capsule 3  . glimepiride (AMARYL)  1 MG tablet Take 1 tab by mouth in the morning if fasting is running 150+. Please do not take this medication if you are ill or not eating as this can cause a low blood sugar. 90 tablet 1  . glucose blood (FREESTYLE TEST STRIPS) test strip Check Blood Sugar Daily as directed (Dx: E11.29) 100 strip 3  . hydrochlorothiazide (HYDRODIURIL) 25 MG tablet Take  1 tablet  Daily  for BP and Fluid Retention /Ankle Swelling 90 tablet 0  . ketoconazole (NIZORAL) 2 % cream Apply 1 application topically 2 (two) times daily. To groin rash 60 g 0  . Lancets (FREESTYLE) lancets CHECK BLOOD SUGAR DAILY AS DIRECTED 100 each 3  . lidocaine-prilocaine (EMLA) cream  Apply to affected area once (Patient taking differently: Apply 1 application topically daily as needed (port access).) 30 g 3  . loratadine (CLARITIN) 10 MG tablet Take 10 mg by mouth daily as needed for allergies.    . Magnesium 250 MG TABS Take 250 mg by mouth every other day.    . metoprolol succinate (TOPROL-XL) 25 MG 24 hr tablet TAKE 1 TABLET BY MOUTH DAILY FOR BLOOD PRESSURE (Patient taking differently: Take 25 mg by mouth daily.) 90 tablet 3  . olaparib (LYNPARZA) 100 MG tablet Take 1 tablet (100 mg total) by mouth 2 (two) times daily. Swallow whole. May take with food to decrease nausea and vomiting. 60 tablet 11  . omeprazole (PRILOSEC) 20 MG capsule Take 1 capsule (20 mg total) by mouth daily. 30 capsule 11  . ondansetron (ZOFRAN) 8 MG tablet Take 1 tablet (8 mg total) by mouth 2 (two) times daily as needed for refractory nausea / vomiting. Start on day 3 after carboplatin chemo. 30 tablet 1  . prochlorperazine (COMPAZINE) 10 MG tablet Take 1 tablet (10 mg total) by mouth every 6 (six) hours as needed for nausea or vomiting. 60 tablet 9  . rosuvastatin (CRESTOR) 5 MG tablet Take 1 tablet (5 mg total) by mouth 3 (three) times a week. In the evening for cholesterol. 36 tablet 3  . vitamin C (ASCORBIC ACID) 500 MG tablet Take 500 mg by mouth every evening.      No current facility-administered medications for this visit.    PHYSICAL EXAMINATION: ECOG PERFORMANCE STATUS: 0 - Asymptomatic  LABORATORY DATA:  I have reviewed the data as listed CMP Latest Ref Rng & Units 04/01/2021 03/20/2021 03/12/2021  Glucose 70 - 99 mg/dL 141(H) 102(H) 111(H)  BUN 8 - 23 mg/dL 18 28(H) 23  Creatinine 0.44 - 1.00 mg/dL 1.51(H) 1.34(H) 1.64(H)  Sodium 135 - 145 mmol/L 144 142 144  Potassium 3.5 - 5.1 mmol/L 3.8 3.7 4.0  Chloride 98 - 111 mmol/L 105 104 108  CO2 22 - 32 mmol/L _0 Calcium 8.9 - 10.3 mg/dL 9.8 9.9 10.1  Total Protein 6.5 - 8.1 g/dL 6.8 7.2 6.8  Total Bilirubin 0.3 - 1.2 mg/dL 0.4  0.5 0.6  Alkaline Phos 38 - 126 U/L 126 130(H) 138(H)  AST 15 - 41 U/L 46(H) 35 49(H)  ALT 0 - 44 U/L 33 25 57(H)    Lab Results  Component Value Date   WBC 3.7 (L) 04/01/2021   HGB 10.6 (L) 04/01/2021   HCT 32.5 (L) 04/01/2021   MCV 99.1 04/01/2021   PLT 77 (L) 04/01/2021   NEUTROABS 2.3 04/01/2021     RADIOGRAPHIC STUDIES: I have personally reviewed the radiological images as listed and agreed with the findings in the  report. MM DIAG BREAST TOMO BILATERAL  Result Date: 03/20/2021 CLINICAL DATA:  Status post left lumpectomy and radiation therapy for breast cancer in 2017. Status post right reduction mammoplasty in 2018. Ovarian cancer diagnosed in November 2021 with interval chemotherapy with a right-sided porta catheter. EXAM: DIGITAL DIAGNOSTIC BILATERAL MAMMOGRAM WITH TOMOSYNTHESIS AND CAD TECHNIQUE: Bilateral digital diagnostic mammography and breast tomosynthesis was performed. The images were evaluated with computer-aided detection. COMPARISON:  Previous exam(s). ACR Breast Density Category c: The breast tissue is heterogeneously dense, which may obscure small masses. FINDINGS: Stable post lumpectomy changes on the left and post reduction changes on the right. No interval findings suspicious for malignancy in either breast. IMPRESSION: No evidence of malignancy. RECOMMENDATION: Per protocol, as the patient is now 2 or more years status post lumpectomy, she may return to annual screening mammography in 1 year. However, given the history of breast cancer, the patient remains eligible for annual diagnostic mammography if preferred. I have discussed the findings and recommendations with the patient. If applicable, a reminder letter will be sent to the patient regarding the next appointment. BI-RADS CATEGORY  2: Benign. Electronically Signed   By: Claudie Revering M.D.   On: 03/20/2021 16:31    I discussed the assessment and treatment plan with the patient. The patient was provided an  opportunity to ask questions and all were answered. The patient agreed with the plan and demonstrated an understanding of the instructions. The patient was advised to call back or seek an in-person evaluation if the symptoms worsen or if the condition fails to improve as anticipated.    I spent 20 minutes for the appointment reviewing test results, discuss management and coordination of care.  Heath Lark, MD 04/01/2021 12:50 PM

## 2021-04-02 LAB — CA 125: Cancer Antigen (CA) 125: 32.3 U/mL (ref 0.0–38.1)

## 2021-04-08 ENCOUNTER — Encounter: Payer: Self-pay | Admitting: Hematology and Oncology

## 2021-04-08 ENCOUNTER — Other Ambulatory Visit: Payer: Self-pay

## 2021-04-08 ENCOUNTER — Inpatient Hospital Stay: Payer: PPO

## 2021-04-08 ENCOUNTER — Inpatient Hospital Stay: Payer: PPO | Admitting: Hematology and Oncology

## 2021-04-08 DIAGNOSIS — E1122 Type 2 diabetes mellitus with diabetic chronic kidney disease: Secondary | ICD-10-CM

## 2021-04-08 DIAGNOSIS — C569 Malignant neoplasm of unspecified ovary: Secondary | ICD-10-CM

## 2021-04-08 DIAGNOSIS — C50212 Malignant neoplasm of upper-inner quadrant of left female breast: Secondary | ICD-10-CM

## 2021-04-08 DIAGNOSIS — D61818 Other pancytopenia: Secondary | ICD-10-CM

## 2021-04-08 DIAGNOSIS — N183 Chronic kidney disease, stage 3 unspecified: Secondary | ICD-10-CM

## 2021-04-08 DIAGNOSIS — C561 Malignant neoplasm of right ovary: Secondary | ICD-10-CM | POA: Diagnosis not present

## 2021-04-08 LAB — COMPREHENSIVE METABOLIC PANEL
ALT: 18 U/L (ref 0–44)
AST: 31 U/L (ref 15–41)
Albumin: 3.7 g/dL (ref 3.5–5.0)
Alkaline Phosphatase: 107 U/L (ref 38–126)
Anion gap: 12 (ref 5–15)
BUN: 20 mg/dL (ref 8–23)
CO2: 28 mmol/L (ref 22–32)
Calcium: 10 mg/dL (ref 8.9–10.3)
Chloride: 106 mmol/L (ref 98–111)
Creatinine, Ser: 1.32 mg/dL — ABNORMAL HIGH (ref 0.44–1.00)
GFR, Estimated: 40 mL/min — ABNORMAL LOW (ref >60)
Glucose, Bld: 132 mg/dL — ABNORMAL HIGH (ref 70–99)
Potassium: 4.1 mmol/L (ref 3.5–5.1)
Sodium: 146 mmol/L — ABNORMAL HIGH (ref 135–145)
Total Bilirubin: 0.5 mg/dL (ref 0.3–1.2)
Total Protein: 6.6 g/dL (ref 6.5–8.1)

## 2021-04-08 LAB — CBC WITH DIFFERENTIAL/PLATELET
Abs Immature Granulocytes: 0.01 10*3/uL (ref 0.00–0.07)
Basophils Absolute: 0 10*3/uL (ref 0.0–0.1)
Basophils Relative: 1 %
Eosinophils Absolute: 0.1 10*3/uL (ref 0.0–0.5)
Eosinophils Relative: 3 %
HCT: 32.8 % — ABNORMAL LOW (ref 36.0–46.0)
Hemoglobin: 10.7 g/dL — ABNORMAL LOW (ref 12.0–15.0)
Immature Granulocytes: 0 %
Lymphocytes Relative: 29 %
Lymphs Abs: 1 10*3/uL (ref 0.7–4.0)
MCH: 32.1 pg (ref 26.0–34.0)
MCHC: 32.6 g/dL (ref 30.0–36.0)
MCV: 98.5 fL (ref 80.0–100.0)
Monocytes Absolute: 0.4 10*3/uL (ref 0.1–1.0)
Monocytes Relative: 10 %
Neutro Abs: 2 10*3/uL (ref 1.7–7.7)
Neutrophils Relative %: 57 %
Platelets: 93 10*3/uL — ABNORMAL LOW (ref 150–400)
RBC: 3.33 MIL/uL — ABNORMAL LOW (ref 3.87–5.11)
RDW: 15.8 % — ABNORMAL HIGH (ref 11.5–15.5)
WBC: 3.5 10*3/uL — ABNORMAL LOW (ref 4.0–10.5)
nRBC: 0 % (ref 0.0–0.2)

## 2021-04-08 NOTE — Assessment & Plan Note (Signed)
Thankfully, she is not symptomatic Her blood counts are improving but not at the same level to resume treatment We will hold treatment this week with plan to recheck next week and if her platelet count is better than 100,000, we will resume treatment

## 2021-04-08 NOTE — Assessment & Plan Note (Addendum)
She has mild intermittent elevated serum creatinine Observe closely for now

## 2021-04-08 NOTE — Assessment & Plan Note (Signed)
Clinically, she feels fine Her pancytopenia has improved since we stopped her treatment last week but not to the level that is safe to resume treatment I will make her another appointment next week to have her blood rechecked When we try to resume treatment next week, she will go on reduced dose Lynparza at 100 mg daily I recommend minimum 3 months of treatment before repeating CT imaging Her last set of tumor marker has improved

## 2021-04-08 NOTE — Progress Notes (Signed)
HEMATOLOGY-ONCOLOGY ELECTRONIC VISIT PROGRESS NOTE  Patient Care Team: [name], MD as PCP - Kinnie Scales, [place] as Referring Physician (Optometry) [name], MD as Consulting Physician (Dermatology) Latanya Maudlin, MD as Consulting Physician (Orthopedic Surgery) Inda Castle, MD (Inactive) as Consulting Physician (Gastroenterology) Awanda Mink Craige Cotta, RN as Oncology Nurse Navigator (Oncology) Heath Lark, MD as Consulting Physician (Hematology and Oncology) Madelon Lips, MD as Consulting Physician (Nephrology)  I connected with  the patient by telephone today.   ASSESSMENT & PLAN:  Ovarian cancer ([place]) Clinically, she feels fine Her pancytopenia has improved since we stopped her treatment last week but not to the level that is safe to resume treatment I will make her another appointment next week to have her blood rechecked When we try to resume treatment next week, she will go on reduced dose Lynparza at 100 mg daily I recommend minimum 3 months of treatment before repeating CT imaging Her last set of tumor marker has improved  Pancytopenia, acquired ([place]) Thankfully, she is not symptomatic Her blood counts are improving but not at the same level to resume treatment We will hold treatment this week with plan to recheck next week and if her platelet count is better than 100,000, we will resume treatment  CKD stage 3 due to type 2 diabetes mellitus ([place] Baker) She has mild intermittent elevated serum creatinine Observe closely for now   No orders of the defined types were placed in this encounter.   INTERVAL HISTORY: Please see below for problem oriented charting. The purpose of today's visit is to review test results and recommendation when to resume [place] Since last time I saw her, she feels well Denies abdominal pain, nausea or changes in bowel habits She has mild lower extremity edema, stable The patient denies any recent signs or symptoms of bleeding  such as spontaneous epistaxis, hematuria or hematochezia.  SUMMARY OF ONCOLOGIC HISTORY: Oncology History Overview Note  HRD positive   Breast cancer of upper-inner quadrant of left female breast ([place])  02/26/2016 Initial Diagnosis   Left breast biopsy 11:00 position: invasive ductal carcinoma with DCIS, ER 90%, PR 10%, HER-2 negative, Ki-67 30%, grade 2, 2.2 cm palpable lesion T2 N0 stage II a clinical stage   03/18/2016 Surgery   Left lumpectomy: Invasive ductal carcinoma, grade 2, 6.3 cm, with high-grade DCIS, margins negative, 0/4 lymph nodes negative, ER 90%, via 10%, HER-2 negative ratio 0.97, Ki-67 30%, T3 N0 stage IIB   03/25/2016 Procedure   Genetic testing is negative for pathogenic mutations within any of the 20 Genes on the breast/ovarian cancer panel   04/04/2016 Oncotype testing   Oncotype DX recurrence score 37, 25% 10 year distant risk of recurrence   04/17/2016 - 07/31/2016 Chemotherapy   Adjuvant chemotherapy with dose dense Adriamycin and Cytoxan followed by Abraxane weekly 8 ( discontinued due to neuropathy)   09/01/2016 - 09/26/2016 Radiation Therapy   Adj XRT 1) Left breast: 42.5 Gy in 17 fractions. 2) Left breast boost: 7.5 Gy in 3 fractions.   12/02/2016 -  Anti-estrogen oral therapy   Anastrozole 1 mg daily   Ovarian cancer ([place])  04/03/2020 Imaging   US pelvis Complex cystic and solid mass in LEFT adnexa 12.5 cm diameter question cystic ovarian neoplasm; recommend correlation with serum tumor markers and further evaluation by MR imaging with and without contrast.     04/04/2020 Imaging   US venous Doppler No evidence of deep venous thrombosis in the right lower extremity. Left common femoral vein  also patent.   04/15/2020 Imaging   MRI pelvis 1. Large complex solid and cystic mass arising from the right adnexa measuring 10.8 by 11.5 by 11.1 cm. This has an aggressive appearance with extensive enhancing mural soft tissue components. Findings are highly suspicious  for malignant ovarian neoplasm. 2. Extensive bilateral retroperitoneal and bilateral iliac adenopathy compatible with metastatic disease. 3. Signs of extensive peritoneal carcinomatosis including ascites, enhancement, thickening and nodularity of the peritoneal reflections, omental caking and bulky peritoneal nodularity. 4. Suspected serosal involvement of the dome of bladder with loss of normal fat plane. Cannot rule out mural invasion by tumor.   04/17/2020 Tumor Marker   Patient's tumor was tested for the following markers: CA-125 Results of the tumor marker test revealed 1762.   04/20/2020 Cancer Staging   Staging form: Ovary, Fallopian Tube, and Primary Peritoneal Carcinoma, AJCC 8th Edition - Clinical: FIGO Stage IIIC (cT3, cN1, cM0) - Signed by Heath Lark, MD on 04/20/2020   04/23/2020 Imaging   1. Redemonstrated dominant mixed solid and cystic mass arising from the vicinity of the right ovary measuring at least 11.5 x 10.7 cm, not significantly changed compared to prior MR, and consistent with primary ovarian malignancy.  2. Numerous bulky retroperitoneal, bilateral iliac, and pelvic sidewall lymph nodes. 3. Moderate volume ascites throughout the abdomen and pelvis with subtle thickening and nodularity throughout the peritoneum, and extensive bulky nodular metastatic disease of the omentum. 4. Constellation of findings is consistent with advanced nodal and peritoneal metastatic disease. 5. There are prominent subcentimeter epicardial lymph nodes, nonspecific although suspicious for nodal metastatic disease. No definite nodal metastatic disease in the chest. Attention on follow-up. 6. There are multiple small subpleural nodules at the right lung base overlying the diaphragm, measuring up to 7 mm. These are generally nonspecific and less favored to represent pulmonary metastatic disease given distribution. Attention on follow-up. 7. Somewhat coarse contour of the liver, suggestive of  cirrhosis, although out without overt morphologic stigmata. 8. Aortic Atherosclerosis (ICD10-I70.0).     04/24/2020 Procedure   Successful placement of a right internal jugular approach power injectable Port-A-Cath. The catheter is ready for immediate use.     05/02/2020 Tumor Marker   Patient's tumor was tested for the following markers: CA-125 Results of the tumor marker test revealed 1921   05/03/2020 - 12/12/2020 Chemotherapy   The patient had carboplatin and taxol for chemotherapy treatment.     05/08/2020 Procedure   Successful ultrasound-guided paracentesis yielding 2.6 liters of peritoneal fluid.   05/16/2020 Procedure   Successful ultrasound-guided paracentesis yielding 3.2 liters of peritoneal fluid   05/25/2020 Procedure   Successful ultrasound-guided paracentesis yielding 3 liters of peritoneal fluid.     06/01/2020 Procedure   Successful ultrasound-guided therapeutic paracentesis yielding 1.7 liters of peritoneal fluid.   06/14/2020 Tumor Marker   Patient's tumor was tested for the following markers: CA-125 Results of the tumor marker test revealed 1309   06/20/2020 Imaging   1. Dominant mixed cystic and solid mass in the central pelvis is stable in the interval. 2. Clear interval decrease in retroperitoneal and pelvic sidewall lymphadenopathy. The bulky omental disease has also clearly decreased in the interval. 3. Interval decrease in ascites. 4. Stable 8 mm subpleural nodule along the diaphragm. 5. Aortic Atherosclerosis (ICD10-I70.0).   07/10/2020 Tumor Marker   Patient's tumor was tested for the following markers: CA-125 Results of the tumor marker test revealed 220   08/03/2020 Tumor Marker   Patient's tumor was tested for the following markers:  CA-125 Results of the tumor marker test revealed 53.3.   09/03/2020 Tumor Marker   Patient's tumor was tested for the following markers: CA-125 Results of the tumor marker test revealed 29.7   09/20/2020 Imaging   1.  Substantial improvement. The right ovarian cystic and solid mass is half the volume that it measured on 06/20/2020. Similar reduction in the bulk of the omental caking of tumor. Prior ascites is resolved and the retroperitoneal adenopathy is markedly improved. 2. Other imaging findings of potential clinical significance: Stable pleural-based nodularity along the right hemidiaphragm measuring about 1.1 by 0.7 by 0.3 cm. Stable hypodense lesion of the right kidney upper pole, probably a cyst although the configuration of this lesion makes it difficult to obtain accurate density measurements. Left ovarian cyst, stable. Chondrocalcinosis involving the acetabular labra. Mild lumbar spondylosis and degenerative disc disease. 3. Aortic atherosclerosis.     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 (stage ypT3c), predominantly viable with focal fibrosis and hemosiderin laden macrophages (possible mild treatment effect)  B: Ovary and fallopian tube, right, salpingo-oophorectomy - High grade serous carcinoma involving right ovary and fallopian tube with surface involvement, size up to 8 cm  - Areas of necrosis and hemosiderin laden macrophages suggestive of treatment effect - 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 - See synoptic report and comment  C: Ovary and fallopian tube, left, salpingo-oophorectomy - High grade serous carcinoma involving the left fallopian tube, size up to 0.5 cm - Ovary with no definite involvement by carcinoma identified - Nodular area of endometriosis and peritoneal inclusion cyst also present - See synoptic report and comment   Procedure:    Bilateral salpingo-oophorectomy    Procedure:    Omentectomy     Specimen Integrity of Right Ovary:    Intact with surface involvement by tumor    Specimen Integrity of Left Ovary:    Capsule intact   TUMOR Tumor Site:    Right fallopian tube: favor as primary site; also involves right ovary and left fallopian tube  Histologic Type:    Serous carcinoma  Histologic Grade:    High grade  Tumor Size:    Greatest Dimension (Centimeters): in fallopian tube: 1.0 cm; in ovary: 8.0 cm Ovarian Surface Involvement:    Present    Laterality:    Right  Fallopian Tube Surface Involvement:    Present    Laterality:    Bilateral  Other Tissue / Organ Involvement:    Right ovary  Other Tissue / Organ Involvement:    Right fallopian tube  Other Tissue / Organ Involvement:    Left fallopian tube  Other Tissue / Organ Involvement:    Omentum  Largest Extrapelvic Peritoneal Focus:    Macroscopic (greater than 2 cm)  Peritoneal / Ascitic Fluid:    Not submitted / unknown  Pleural Fluid:    Not submitted / unknown  Treatment Effect:    No definite or minimal response identified (chemotherapy response score [CRS] 1)   LYMPH NODES Regional Lymph Nodes:    No lymph nodes submitted or found   PATHOLOGIC STAGE CLASSIFICATION (pTNM, AJCC 8th Edition) TNM Descriptors:    y (post-treatment)  Primary Tumor (pT):    pT3c  Regional Lymph Nodes (pN):    pNX   FIGO STAGE FIGO Stage:  IIIC   ADDITIONAL FINDINGS Additional Findings:    Serous tubal intraepithelial carcinoma (STIC)  Additional Findings:    Left ovary with no definite carcinoma identified, left-sided nodule of endometriosis and peritoneal inclusion cyst    10/18/2020 - 10/20/2020 Hospital Admission   She was admitted to [facility] for interval debulking surgery   11/19/2020 Tumor Marker   Patient's tumor was tested for the following markers: CA-125. Results of the tumor marker test revealed 38.3   12/12/2020 Tumor Marker   Patient's tumor was tested for the following markers CA-125. Results of the tumor  marker test revealed 42.3.   01/02/2021 Tumor Marker   Patient's tumor was tested for the following markers: CA-125. Results of the tumor marker test revealed 37.3   01/29/2021 Tumor Marker   Patient's tumor was tested for the following markers: CA-125 Results of the tumor marker test revealed 44.3   01/29/2021 Imaging   1. Interval increase in loculated appearing ascites in the low central abdomen and pelvis. 2. There is extensive peritoneal nodularity surrounding this fluid, the nodularity itself not appreciably changed in appearance compared to prior examination. 3. Additional peritoneal nodularity and/or mesenteric lymph nodes are unchanged. 4. Interval decrease in size of a low-attenuation nodule overlying the right hemidiaphragm, now measuring 1.2 cm, previously 1.8 cm when measured similarly. Findings are consistent with treatment response of a metastatic nodule. No other evidence of intrathoracic metastatic disease. 5. Status post hysterectomy, oophorectomy, and omentectomy.   01/31/2021 -  Chemotherapy   She is started on Olaparib       03/04/2021 Tumor Marker   Patient's tumor was tested for the following markers: CA-125 Results of the tumor marker test revealed 39.7   04/01/2021 Tumor Marker   Patient's tumor was tested for the following markers: CA-125 Results of the tumor marker test revealed 32.3     REVIEW OF SYSTEMS:   Constitutional: Denies fevers, chills or abnormal weight loss Eyes: Denies blurriness of vision Ears, nose, mouth, throat, and face: Denies mucositis or sore throat Respiratory: Denies cough, dyspnea or wheezes Cardiovascular: Denies palpitation, chest discomfort Gastrointestinal:  Denies nausea, heartburn or change in bowel habits Skin: Denies abnormal skin rashes Lymphatics: Denies new lymphadenopathy or easy bruising Neurological:Denies numbness, tingling or new weaknesses Behavioral/Psych: Mood is stable, no new changes  Extremities: No lower  extremity edema All other systems were reviewed with the patient and are negative.  I have reviewed the past medical history, past surgical history, social history and family history with the patient and they are unchanged from previous note.  ALLERGIES:  is allergic to keflex [cephalexin], meloxicam, prednisone, premarin [estrogens conjugated], and trovan [alatrofloxacin].  MEDICATIONS:  Current Outpatient Medications  Medication Sig Dispense Refill  . acetaminophen (TYLENOL) 325 MG tablet Take 325 mg by mouth every 6 (six) hours as needed for headache.    . allopurinol (ZYLOPRIM) 300 MG tablet TAKE 1 TABLET BY MOUTH DAILY FOR GOUT PREVENTION 90 tablet 1  . blood glucose meter kit and supplies KIT Dispense based on patient and insurance preference. Use to check blood sugar once daily. Dx: E11.22, N18.30 1 each 12  . Cholecalciferol (VITAMIN D3) 5000 units CAPS Take 5,000 Units by mouth daily.    . cyanocobalamin 1000 MCG tablet Take 1,000 mcg by mouth daily.    Marland Kitchen ezetimibe (ZETIA) 10 MG tablet Take 10 mg by mouth daily.    Marland Kitchen gabapentin (NEURONTIN) 300 MG capsule Take 1 capsule in the morning and afternoon and 2 capsules at  night 360 capsule 3  . glimepiride (AMARYL) 1 MG tablet Take 1 tab by mouth in the morning if fasting is running 150+. Please do not take this medication if you are ill or not eating as this can cause a low blood sugar. 90 tablet 1  . glucose blood (FREESTYLE TEST STRIPS) test strip Check Blood Sugar Daily as directed (Dx: E11.29) 100 strip 3  . hydrochlorothiazide (HYDRODIURIL) 25 MG tablet Take  1 tablet  Daily  for BP and Fluid Retention /Ankle Swelling 90 tablet 0  . ketoconazole (NIZORAL) 2 % cream Apply 1 application topically 2 (two) times daily. To groin rash 60 g 0  . Lancets (FREESTYLE) lancets CHECK BLOOD SUGAR DAILY AS DIRECTED 100 each 3  . lidocaine-prilocaine (EMLA) cream Apply to affected area once (Patient taking differently: Apply 1 application topically  daily as needed (port access).) 30 g 3  . loratadine (CLARITIN) 10 MG tablet Take 10 mg by mouth daily as needed for allergies.    . Magnesium 250 MG TABS Take 250 mg by mouth every other day.    . metoprolol succinate (TOPROL-XL) 25 MG 24 hr tablet TAKE 1 TABLET BY MOUTH DAILY FOR BLOOD PRESSURE (Patient taking differently: Take 25 mg by mouth daily.) 90 tablet 3  . olaparib (LYNPARZA) 100 MG tablet Take 1 tablet (100 mg total) by mouth 2 (two) times daily. Swallow whole. May take with food to decrease nausea and vomiting. 60 tablet 11  . omeprazole (PRILOSEC) 20 MG capsule Take 1 capsule (20 mg total) by mouth daily. 30 capsule 11  . ondansetron (ZOFRAN) 8 MG tablet Take 1 tablet (8 mg total) by mouth 2 (two) times daily as needed for refractory nausea / vomiting. Start on day 3 after carboplatin chemo. 30 tablet 1  . prochlorperazine (COMPAZINE) 10 MG tablet Take 1 tablet (10 mg total) by mouth every 6 (six) hours as needed for nausea or vomiting. 60 tablet 9  . rosuvastatin (CRESTOR) 5 MG tablet Take 1 tablet (5 mg total) by mouth 3 (three) times a week. In the evening for cholesterol. 36 tablet 3  . vitamin C (ASCORBIC ACID) 500 MG tablet Take 500 mg by mouth every evening.      No current facility-administered medications for this visit.    PHYSICAL EXAMINATION: ECOG PERFORMANCE STATUS: 1 - Symptomatic but completely ambulatory  LABORATORY DATA:  I have reviewed the data as listed CMP Latest Ref Rng & Units 04/08/2021 04/01/2021 03/20/2021  Glucose 70 - 99 mg/dL 132(H) 141(H) 102(H)  BUN 8 - 23 mg/dL 20 18 28(H)  Creatinine 0.44 - 1.00 mg/dL 1.32(H) 1.51(H) 1.34(H)  Sodium 135 - 145 mmol/L 146(H) 144 142  Potassium 3.5 - 5.1 mmol/L 4.1 3.8 3.7  Chloride 98 - 111 mmol/L 106 105 104  CO2 22 - 32 mmol/L _0 Calcium 8.9 - 10.3 mg/dL 10.0 9.8 9.9  Total Protein 6.5 - 8.1 g/dL 6.6 6.8 7.2  Total Bilirubin 0.3 - 1.2 mg/dL 0.5 0.4 0.5  Alkaline Phos 38 - 126 U/L 107 126 130(H)  AST 15 -  41 U/L 31 46(H) 35  ALT 0 - 44 U/L 18 33 25    Lab Results  Component Value Date   WBC 3.5 (L) 04/08/2021   HGB 10.7 (L) 04/08/2021   HCT 32.8 (L) 04/08/2021   MCV 98.5 04/08/2021   PLT 93 (L) 04/08/2021   NEUTROABS 2.0 04/08/2021     RADIOGRAPHIC STUDIES: I have personally reviewed the  radiological images as listed and agreed with the findings in the report. MM DIAG BREAST TOMO BILATERAL  Result Date: 03/20/2021 CLINICAL DATA:  Status post left lumpectomy and radiation therapy for breast cancer in 2017. Status post right reduction mammoplasty in 2018. Ovarian cancer diagnosed in November 2021 with interval chemotherapy with a right-sided porta catheter. EXAM: DIGITAL DIAGNOSTIC BILATERAL MAMMOGRAM WITH TOMOSYNTHESIS AND CAD TECHNIQUE: Bilateral digital diagnostic mammography and breast tomosynthesis was performed. The images were evaluated with computer-aided detection. COMPARISON:  Previous exam(s). ACR Breast Density Category c: The breast tissue is heterogeneously dense, which may obscure small masses. FINDINGS: Stable post lumpectomy changes on the left and post reduction changes on the right. No interval findings suspicious for malignancy in either breast. IMPRESSION: No evidence of malignancy. RECOMMENDATION: Per protocol, as the patient is now 2 or more years status post lumpectomy, she may return to annual screening mammography in 1 year. However, given the history of breast cancer, the patient remains eligible for annual diagnostic mammography if preferred. I have discussed the findings and recommendations with the patient. If applicable, a reminder letter will be sent to the patient regarding the next appointment. BI-RADS CATEGORY  2: Benign. Electronically Signed   By: Claudie Revering M.D.   On: 03/20/2021 16:31    I discussed the assessment and treatment plan with the patient. The patient was provided an opportunity to ask questions and all were answered. The patient agreed with the  plan and demonstrated an understanding of the instructions. The patient was advised to call back or seek an in-person evaluation if the symptoms worsen or if the condition fails to improve as anticipated.    I spent 20 minutes for the appointment reviewing test results, discuss management and coordination of care.  Heath Lark, MD 04/08/2021 3:13 PM

## 2021-04-09 ENCOUNTER — Telehealth: Payer: Self-pay | Admitting: Hematology and Oncology

## 2021-04-09 NOTE — Telephone Encounter (Signed)
Scheduled appointment  per 05/09 schedule message. Patient is aware.

## 2021-04-16 ENCOUNTER — Inpatient Hospital Stay: Payer: PPO

## 2021-04-16 ENCOUNTER — Encounter: Payer: Self-pay | Admitting: Hematology and Oncology

## 2021-04-16 ENCOUNTER — Other Ambulatory Visit: Payer: Self-pay

## 2021-04-16 ENCOUNTER — Inpatient Hospital Stay: Payer: PPO | Admitting: Hematology and Oncology

## 2021-04-16 DIAGNOSIS — N183 Chronic kidney disease, stage 3 unspecified: Secondary | ICD-10-CM | POA: Diagnosis not present

## 2021-04-16 DIAGNOSIS — Z170 Estrogen receptor positive status [ER+]: Secondary | ICD-10-CM

## 2021-04-16 DIAGNOSIS — C569 Malignant neoplasm of unspecified ovary: Secondary | ICD-10-CM

## 2021-04-16 DIAGNOSIS — D61818 Other pancytopenia: Secondary | ICD-10-CM

## 2021-04-16 DIAGNOSIS — C561 Malignant neoplasm of right ovary: Secondary | ICD-10-CM | POA: Diagnosis not present

## 2021-04-16 DIAGNOSIS — E1122 Type 2 diabetes mellitus with diabetic chronic kidney disease: Secondary | ICD-10-CM | POA: Diagnosis not present

## 2021-04-16 DIAGNOSIS — C50212 Malignant neoplasm of upper-inner quadrant of left female breast: Secondary | ICD-10-CM

## 2021-04-16 LAB — CBC WITH DIFFERENTIAL/PLATELET
Abs Immature Granulocytes: 0.01 10*3/uL (ref 0.00–0.07)
Basophils Absolute: 0 10*3/uL (ref 0.0–0.1)
Basophils Relative: 1 %
Eosinophils Absolute: 0.1 10*3/uL (ref 0.0–0.5)
Eosinophils Relative: 2 %
HCT: 32.4 % — ABNORMAL LOW (ref 36.0–46.0)
Hemoglobin: 10.8 g/dL — ABNORMAL LOW (ref 12.0–15.0)
Immature Granulocytes: 0 %
Lymphocytes Relative: 38 %
Lymphs Abs: 1.4 10*3/uL (ref 0.7–4.0)
MCH: 32 pg (ref 26.0–34.0)
MCHC: 33.3 g/dL (ref 30.0–36.0)
MCV: 96.1 fL (ref 80.0–100.0)
Monocytes Absolute: 0.4 10*3/uL (ref 0.1–1.0)
Monocytes Relative: 11 %
Neutro Abs: 1.8 10*3/uL (ref 1.7–7.7)
Neutrophils Relative %: 48 %
Platelets: 94 10*3/uL — ABNORMAL LOW (ref 150–400)
RBC: 3.37 MIL/uL — ABNORMAL LOW (ref 3.87–5.11)
RDW: 15.1 % (ref 11.5–15.5)
WBC: 3.6 10*3/uL — ABNORMAL LOW (ref 4.0–10.5)
nRBC: 0 % (ref 0.0–0.2)

## 2021-04-16 LAB — COMPREHENSIVE METABOLIC PANEL
ALT: 29 U/L (ref 0–44)
AST: 42 U/L — ABNORMAL HIGH (ref 15–41)
Albumin: 3.8 g/dL (ref 3.5–5.0)
Alkaline Phosphatase: 113 U/L (ref 38–126)
Anion gap: 9 (ref 5–15)
BUN: 27 mg/dL — ABNORMAL HIGH (ref 8–23)
CO2: 29 mmol/L (ref 22–32)
Calcium: 10.3 mg/dL (ref 8.9–10.3)
Chloride: 106 mmol/L (ref 98–111)
Creatinine, Ser: 1.36 mg/dL — ABNORMAL HIGH (ref 0.44–1.00)
GFR, Estimated: 39 mL/min — ABNORMAL LOW (ref >60)
Glucose, Bld: 117 mg/dL — ABNORMAL HIGH (ref 70–99)
Potassium: 4.1 mmol/L (ref 3.5–5.1)
Sodium: 144 mmol/L (ref 135–145)
Total Bilirubin: 0.5 mg/dL (ref 0.3–1.2)
Total Protein: 6.9 g/dL (ref 6.5–8.1)

## 2021-04-16 NOTE — Assessment & Plan Note (Signed)
Unfortunately, she has persistent pancytopenia While her blood counts are slightly improving, she is not at the safe level to proceed with olaparib Her tumor marker appears to be decreasing despite her olaparib is placed on hold for some time I plan to see her again in the first week of June for repeat blood work I will research a little bit with the pharmacist to see if we can reduce the dose of olaparib further

## 2021-04-16 NOTE — Progress Notes (Signed)
HEMATOLOGY-ONCOLOGY ELECTRONIC VISIT PROGRESS NOTE  Patient Care Team: [name], MD as PCP - Kinnie Scales, [place] as Referring Physician (Optometry) [name], MD as Consulting Physician (Dermatology) Latanya Maudlin, MD as Consulting Physician (Orthopedic Surgery) Inda Castle, MD (Inactive) as Consulting Physician (Gastroenterology) Jacqulyn Liner, RN as Oncology Nurse Navigator (Oncology) Heath Lark, MD as Consulting Physician (Hematology and Oncology) Madelon Lips, MD as Consulting Physician (Nephrology)  I connected with  the patient via telephone for MyChart virtual visit  ASSESSMENT & PLAN:  Ovarian cancer [facility]) Unfortunately, she has persistent pancytopenia While her blood counts are slightly improving, she is not at the safe level to proceed with olaparib Her tumor marker appears to be decreasing despite her olaparib is placed on hold for some time I plan to see her again in the first week of June for repeat blood work I will research a little bit with the pharmacist to see if we can reduce the dose of olaparib further  Pancytopenia, acquired ([place]) Thankfully, she is not symptomatic Her blood counts are improving but not at the same level to resume treatment We will hold treatment this week with plan to recheck next month and if her platelet count is better than 100,000, we will resume treatment  CKD stage 3 due to type 2 diabetes mellitus ([place]) She has mild intermittent elevated serum creatinine Observe closely for now   No orders of the defined types were placed in this encounter.   INTERVAL HISTORY: Please see below for problem oriented charting The purpose of today's visit is to review test results and to see if she can resume olaparib Since last time I talked to her, she is feeling well No recent infection, fever or chills Her appetite is fair She has no new symptoms. The patient denies any recent signs or symptoms of bleeding such as  spontaneous epistaxis, hematuria or hematochezia.   SUMMARY OF ONCOLOGIC HISTORY: Oncology History Overview Note  HRD positive   Breast cancer of upper-inner quadrant of left female breast ([place])  02/26/2016 Initial Diagnosis   Left breast biopsy 11:00 position: invasive ductal carcinoma with DCIS, ER 90%, PR 10%, HER-2 negative, Ki-67 30%, grade 2, 2.2 cm palpable lesion T2 N0 stage II a clinical stage   03/18/2016 Surgery   Left lumpectomy: Invasive ductal carcinoma, grade 2, 6.3 cm, with high-grade DCIS, margins negative, 0/4 lymph nodes negative, ER 90%, via 10%, HER-2 negative ratio 0.97, Ki-67 30%, T3 N0 stage IIB   03/25/2016 Procedure   Genetic testing is negative for pathogenic mutations within any of the 20 Genes on the breast/ovarian cancer panel   04/04/2016 Oncotype testing   Oncotype DX recurrence score 37, 25% 10 year distant risk of recurrence   04/17/2016 - 07/31/2016 Chemotherapy   Adjuvant chemotherapy with dose dense Adriamycin and Cytoxan followed by Abraxane weekly 8 ( discontinued due to neuropathy)   09/01/2016 - 09/26/2016 Radiation Therapy   Adj XRT 1) Left breast: 42.5 Gy in 17 fractions. 2) Left breast boost: 7.5 Gy in 3 fractions.   12/02/2016 -  Anti-estrogen oral therapy   Anastrozole 1 mg daily   Ovarian cancer ([place])  04/03/2020 Imaging   US pelvis Complex cystic and solid mass in LEFT adnexa 12.5 cm diameter question cystic ovarian neoplasm; recommend correlation with serum tumor markers and further evaluation by MR imaging with and without contrast.     04/04/2020 Imaging   US venous Doppler No evidence of deep venous thrombosis in the right lower  extremity. Left common femoral vein also patent.   04/15/2020 Imaging   MRI pelvis 1. Large complex solid and cystic mass arising from the right adnexa measuring 10.8 by 11.5 by 11.1 cm. This has an aggressive appearance with extensive enhancing mural soft tissue components. Findings are highly suspicious for  malignant ovarian neoplasm. 2. Extensive bilateral retroperitoneal and bilateral iliac adenopathy compatible with metastatic disease. 3. Signs of extensive peritoneal carcinomatosis including ascites, enhancement, thickening and nodularity of the peritoneal reflections, omental caking and bulky peritoneal nodularity. 4. Suspected serosal involvement of the dome of bladder with loss of normal fat plane. Cannot rule out mural invasion by tumor.   04/17/2020 Tumor Marker   Patient's tumor was tested for the following markers: CA-125 Results of the tumor marker test revealed 1762.   04/20/2020 Cancer Staging   Staging form: Ovary, Fallopian Tube, and Primary Peritoneal Carcinoma, AJCC 8th Edition - Clinical: FIGO Stage IIIC (cT3, cN1, cM0) - Signed by Heath Lark, MD on 04/20/2020   04/23/2020 Imaging   1. Redemonstrated dominant mixed solid and cystic mass arising from the vicinity of the right ovary measuring at least 11.5 x 10.7 cm, not significantly changed compared to prior MR, and consistent with primary ovarian malignancy.  2. Numerous bulky retroperitoneal, bilateral iliac, and pelvic sidewall lymph nodes. 3. Moderate volume ascites throughout the abdomen and pelvis with subtle thickening and nodularity throughout the peritoneum, and extensive bulky nodular metastatic disease of the omentum. 4. Constellation of findings is consistent with advanced nodal and peritoneal metastatic disease. 5. There are prominent subcentimeter epicardial lymph nodes, nonspecific although suspicious for nodal metastatic disease. No definite nodal metastatic disease in the chest. Attention on follow-up. 6. There are multiple small subpleural nodules at the right lung base overlying the diaphragm, measuring up to 7 mm. These are generally nonspecific and less favored to represent pulmonary metastatic disease given distribution. Attention on follow-up. 7. Somewhat coarse contour of the liver, suggestive of cirrhosis,  although out without overt morphologic stigmata. 8. Aortic Atherosclerosis (ICD10-I70.0).     04/24/2020 Procedure   Successful placement of a right internal jugular approach power injectable Port-A-Cath. The catheter is ready for immediate use.     05/02/2020 Tumor Marker   Patient's tumor was tested for the following markers: CA-125 Results of the tumor marker test revealed 1921   05/03/2020 - 12/12/2020 Chemotherapy   The patient had carboplatin and taxol for chemotherapy treatment.     05/08/2020 Procedure   Successful ultrasound-guided paracentesis yielding 2.6 liters of peritoneal fluid.   05/16/2020 Procedure   Successful ultrasound-guided paracentesis yielding 3.2 liters of peritoneal fluid   05/25/2020 Procedure   Successful ultrasound-guided paracentesis yielding 3 liters of peritoneal fluid.     06/01/2020 Procedure   Successful ultrasound-guided therapeutic paracentesis yielding 1.7 liters of peritoneal fluid.   06/14/2020 Tumor Marker   Patient's tumor was tested for the following markers: CA-125 Results of the tumor marker test revealed 1309   06/20/2020 Imaging   1. Dominant mixed cystic and solid mass in the central pelvis is stable in the interval. 2. Clear interval decrease in retroperitoneal and pelvic sidewall lymphadenopathy. The bulky omental disease has also clearly decreased in the interval. 3. Interval decrease in ascites. 4. Stable 8 mm subpleural nodule along the diaphragm. 5. Aortic Atherosclerosis (ICD10-I70.0).   07/10/2020 Tumor Marker   Patient's tumor was tested for the following markers: CA-125 Results of the tumor marker test revealed 220   08/03/2020 Tumor Marker   Patient's tumor was  tested for the following markers: CA-125 Results of the tumor marker test revealed 53.3.   09/03/2020 Tumor Marker   Patient's tumor was tested for the following markers: CA-125 Results of the tumor marker test revealed 29.7   09/20/2020 Imaging   1. Substantial  improvement. The right ovarian cystic and solid mass is half the volume that it measured on 06/20/2020. Similar reduction in the bulk of the omental caking of tumor. Prior ascites is resolved and the retroperitoneal adenopathy is markedly improved. 2. Other imaging findings of potential clinical significance: Stable pleural-based nodularity along the right hemidiaphragm measuring about 1.1 by 0.7 by 0.3 cm. Stable hypodense lesion of the right kidney upper pole, probably a cyst although the configuration of this lesion makes it difficult to obtain accurate density measurements. Left ovarian cyst, stable. Chondrocalcinosis involving the acetabular labra. Mild lumbar spondylosis and degenerative disc disease. 3. Aortic atherosclerosis.     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 (stage ypT3c), predominantly viable with focal fibrosis and hemosiderin laden macrophages (possible mild treatment effect)  B: Ovary and fallopian tube, right, salpingo-oophorectomy - High grade serous carcinoma involving right ovary and fallopian tube with surface involvement, size up to 8 cm  - Areas of necrosis and hemosiderin laden macrophages suggestive of treatment effect - 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 - See synoptic report and comment  C: Ovary and fallopian tube, left, salpingo-oophorectomy - High grade serous carcinoma involving the left fallopian tube, size up to 0.5 cm - Ovary with no definite involvement by carcinoma identified - Nodular area of endometriosis and peritoneal inclusion cyst also present - See synoptic report and comment   Procedure:    Bilateral salpingo-oophorectomy    Procedure:    Omentectomy    Specimen  Integrity of Right Ovary:    Intact with surface involvement by tumor    Specimen Integrity of Left Ovary:    Capsule intact   TUMOR Tumor Site:    Right fallopian tube: favor as primary site; also involves right ovary and left fallopian tube  Histologic Type:    Serous carcinoma  Histologic Grade:    High grade  Tumor Size:    Greatest Dimension (Centimeters): in fallopian tube: 1.0 cm; in ovary: 8.0 cm Ovarian Surface Involvement:    Present    Laterality:    Right  Fallopian Tube Surface Involvement:    Present    Laterality:    Bilateral  Other Tissue / Organ Involvement:    Right ovary  Other Tissue / Organ Involvement:    Right fallopian tube  Other Tissue / Organ Involvement:    Left fallopian tube  Other Tissue / Organ Involvement:    Omentum  Largest Extrapelvic Peritoneal Focus:    Macroscopic (greater than 2 cm)  Peritoneal / Ascitic Fluid:    Not submitted / unknown  Pleural Fluid:    Not submitted / unknown  Treatment Effect:    No definite or minimal response identified (chemotherapy response score [CRS] 1)   LYMPH NODES Regional Lymph Nodes:    No lymph nodes submitted or found   PATHOLOGIC STAGE CLASSIFICATION (pTNM, AJCC 8th Edition) TNM Descriptors:    y (post-treatment)  Primary Tumor (pT):    pT3c  Regional Lymph Nodes (pN):    pNX  FIGO STAGE FIGO Stage:    IIIC   ADDITIONAL FINDINGS Additional Findings:    Serous tubal intraepithelial carcinoma (STIC)  Additional Findings:    Left ovary with no definite carcinoma identified, left-sided nodule of endometriosis and peritoneal inclusion cyst    10/18/2020 - 10/20/2020 Hospital Admission   She was admitted to [facility] for interval debulking surgery   11/19/2020 Tumor Marker   Patient's tumor was tested for the following markers: CA-125. Results of the tumor marker test revealed 38.3   12/12/2020 Tumor Marker   Patient's tumor was tested for the following markers CA-125. Results of the tumor marker  test revealed 42.3.   01/02/2021 Tumor Marker   Patient's tumor was tested for the following markers: CA-125. Results of the tumor marker test revealed 37.3   01/29/2021 Tumor Marker   Patient's tumor was tested for the following markers: CA-125 Results of the tumor marker test revealed 44.3   01/29/2021 Imaging   1. Interval increase in loculated appearing ascites in the low central abdomen and pelvis. 2. There is extensive peritoneal nodularity surrounding this fluid, the nodularity itself not appreciably changed in appearance compared to prior examination. 3. Additional peritoneal nodularity and/or mesenteric lymph nodes are unchanged. 4. Interval decrease in size of a low-attenuation nodule overlying the right hemidiaphragm, now measuring 1.2 cm, previously 1.8 cm when measured similarly. Findings are consistent with treatment response of a metastatic nodule. No other evidence of intrathoracic metastatic disease. 5. Status post hysterectomy, oophorectomy, and omentectomy.   01/31/2021 -  Chemotherapy   She is started on Olaparib       03/04/2021 Tumor Marker   Patient's tumor was tested for the following markers: CA-125 Results of the tumor marker test revealed 39.7   04/01/2021 Tumor Marker   Patient's tumor was tested for the following markers: CA-125 Results of the tumor marker test revealed 32.3     REVIEW OF SYSTEMS:   Constitutional: Denies fevers, chills or abnormal weight loss Eyes: Denies blurriness of vision Ears, nose, mouth, throat, and face: Denies mucositis or sore throat Respiratory: Denies cough, dyspnea or wheezes Cardiovascular: Denies palpitation, chest discomfort Gastrointestinal:  Denies nausea, heartburn or change in bowel habits Skin: Denies abnormal skin rashes Lymphatics: Denies new lymphadenopathy or easy bruising Neurological:Denies numbness, tingling or new weaknesses Behavioral/Psych: Mood is stable, no new changes  Extremities: No lower extremity  edema All other systems were reviewed with the patient and are negative.  I have reviewed the past medical history, past surgical history, social history and family history with the patient and they are unchanged from previous note.  ALLERGIES:  is allergic to keflex [cephalexin], meloxicam, prednisone, premarin [estrogens conjugated], and trovan [alatrofloxacin].  MEDICATIONS:  Current Outpatient Medications  Medication Sig Dispense Refill  . acetaminophen (TYLENOL) 325 MG tablet Take 325 mg by mouth every 6 (six) hours as needed for headache.    . allopurinol (ZYLOPRIM) 300 MG tablet TAKE 1 TABLET BY MOUTH DAILY FOR GOUT PREVENTION 90 tablet 1  . blood glucose meter kit and supplies KIT Dispense based on patient and insurance preference. Use to check blood sugar once daily. Dx: E11.22, N18.30 1 each 12  . Cholecalciferol (VITAMIN D3) 5000 units CAPS Take 5,000 Units by mouth daily.    . cyanocobalamin 1000 MCG tablet Take 1,000 mcg by mouth daily.    Marland Kitchen ezetimibe (ZETIA) 10 MG tablet Take 10 mg by mouth daily.    Marland Kitchen gabapentin (NEURONTIN) 300 MG capsule Take 1 capsule in the  morning and afternoon and 2 capsules at night 360 capsule 3  . glimepiride (AMARYL) 1 MG tablet Take 1 tab by mouth in the morning if fasting is running 150+. Please do not take this medication if you are ill or not eating as this can cause a low blood sugar. 90 tablet 1  . glucose blood (FREESTYLE TEST STRIPS) test strip Check Blood Sugar Daily as directed (Dx: E11.29) 100 strip 3  . hydrochlorothiazide (HYDRODIURIL) 25 MG tablet Take  1 tablet  Daily  for BP and Fluid Retention /Ankle Swelling 90 tablet 0  . ketoconazole (NIZORAL) 2 % cream Apply 1 application topically 2 (two) times daily. To groin rash 60 g 0  . Lancets (FREESTYLE) lancets CHECK BLOOD SUGAR DAILY AS DIRECTED 100 each 3  . lidocaine-prilocaine (EMLA) cream Apply to affected area once (Patient taking differently: Apply 1 application topically daily as  needed (port access).) 30 g 3  . loratadine (CLARITIN) 10 MG tablet Take 10 mg by mouth daily as needed for allergies.    . Magnesium 250 MG TABS Take 250 mg by mouth every other day.    . metoprolol succinate (TOPROL-XL) 25 MG 24 hr tablet TAKE 1 TABLET BY MOUTH DAILY FOR BLOOD PRESSURE (Patient taking differently: Take 25 mg by mouth daily.) 90 tablet 3  . olaparib (LYNPARZA) 100 MG tablet Take 1 tablet (100 mg total) by mouth 2 (two) times daily. Swallow whole. May take with food to decrease nausea and vomiting. 60 tablet 11  . omeprazole (PRILOSEC) 20 MG capsule Take 1 capsule (20 mg total) by mouth daily. 30 capsule 11  . ondansetron (ZOFRAN) 8 MG tablet Take 1 tablet (8 mg total) by mouth 2 (two) times daily as needed for refractory nausea / vomiting. Start on day 3 after carboplatin chemo. 30 tablet 1  . prochlorperazine (COMPAZINE) 10 MG tablet Take 1 tablet (10 mg total) by mouth every 6 (six) hours as needed for nausea or vomiting. 60 tablet 9  . rosuvastatin (CRESTOR) 5 MG tablet Take 1 tablet (5 mg total) by mouth 3 (three) times a week. In the evening for cholesterol. 36 tablet 3  . vitamin C (ASCORBIC ACID) 500 MG tablet Take 500 mg by mouth every evening.      No current facility-administered medications for this visit.    PHYSICAL EXAMINATION: ECOG PERFORMANCE STATUS: 1 - Symptomatic but completely ambulatory  LABORATORY DATA:  I have reviewed the data as listed CMP Latest Ref Rng & Units 04/16/2021 04/08/2021 04/01/2021  Glucose 70 - 99 mg/dL 117(H) 132(H) 141(H)  BUN 8 - 23 mg/dL 27(H) 20 18  Creatinine 0.44 - 1.00 mg/dL 1.36(H) 1.32(H) 1.51(H)  Sodium 135 - 145 mmol/L 144 146(H) 144  Potassium 3.5 - 5.1 mmol/L 4.1 4.1 3.8  Chloride 98 - 111 mmol/L 106 106 105  CO2 22 - 32 mmol/L 29 28 29   Calcium 8.9 - 10.3 mg/dL 10.3 10.0 9.8  Total Protein 6.5 - 8.1 g/dL 6.9 6.6 6.8  Total Bilirubin 0.3 - 1.2 mg/dL 0.5 0.5 0.4  Alkaline Phos 38 - 126 U/L 113 107 126  AST 15 - 41 U/L  42(H) 31 46(H)  ALT 0 - 44 U/L 29 18 33    Lab Results  Component Value Date   WBC 3.6 (L) 04/16/2021   HGB 10.8 (L) 04/16/2021   HCT 32.4 (L) 04/16/2021   MCV 96.1 04/16/2021   PLT 94 (L) 04/16/2021   NEUTROABS 1.8 04/16/2021  RADIOGRAPHIC STUDIES: I have personally reviewed the radiological images as listed and agreed with the findings in the report. MM DIAG BREAST TOMO BILATERAL  Result Date: 03/20/2021 CLINICAL DATA:  Status post left lumpectomy and radiation therapy for breast cancer in 2017. Status post right reduction mammoplasty in 2018. Ovarian cancer diagnosed in November 2021 with interval chemotherapy with a right-sided porta catheter. EXAM: DIGITAL DIAGNOSTIC BILATERAL MAMMOGRAM WITH TOMOSYNTHESIS AND CAD TECHNIQUE: Bilateral digital diagnostic mammography and breast tomosynthesis was performed. The images were evaluated with computer-aided detection. COMPARISON:  Previous exam(s). ACR Breast Density Category c: The breast tissue is heterogeneously dense, which may obscure small masses. FINDINGS: Stable post lumpectomy changes on the left and post reduction changes on the right. No interval findings suspicious for malignancy in either breast. IMPRESSION: No evidence of malignancy. RECOMMENDATION: Per protocol, as the patient is now 2 or more years status post lumpectomy, she may return to annual screening mammography in 1 year. However, given the history of breast cancer, the patient remains eligible for annual diagnostic mammography if preferred. I have discussed the findings and recommendations with the patient. If applicable, a reminder letter will be sent to the patient regarding the next appointment. BI-RADS CATEGORY  2: Benign. Electronically Signed   By: Claudie Revering M.D.   On: 03/20/2021 16:31    I discussed the assessment and treatment plan with the patient. The patient was provided an opportunity to ask questions and all were answered. The patient agreed with the plan  and demonstrated an understanding of the instructions. The patient was advised to call back or seek an in-person evaluation if the symptoms worsen or if the condition fails to improve as anticipated.    I spent 20 minutes for the appointment reviewing test results, discuss management and coordination of care.  Heath Lark, MD 04/16/2021 3:19 PM

## 2021-04-16 NOTE — Assessment & Plan Note (Signed)
She has mild intermittent elevated serum creatinine Observe closely for now

## 2021-04-16 NOTE — Assessment & Plan Note (Signed)
Thankfully, she is not symptomatic Her blood counts are improving but not at the same level to resume treatment We will hold treatment this week with plan to recheck next month and if her platelet count is better than 100,000, we will resume treatment

## 2021-04-28 ENCOUNTER — Other Ambulatory Visit: Payer: Self-pay | Admitting: Internal Medicine

## 2021-04-28 DIAGNOSIS — E1169 Type 2 diabetes mellitus with other specified complication: Secondary | ICD-10-CM

## 2021-04-28 MED ORDER — EZETIMIBE 10 MG PO TABS: ORAL_TABLET | ORAL | 3 refills | Status: DC

## 2021-05-06 ENCOUNTER — Inpatient Hospital Stay: Payer: PPO

## 2021-05-06 ENCOUNTER — Inpatient Hospital Stay: Payer: PPO | Attending: Gynecologic Oncology | Admitting: Hematology and Oncology

## 2021-05-06 ENCOUNTER — Other Ambulatory Visit: Payer: Self-pay

## 2021-05-06 ENCOUNTER — Telehealth: Payer: Self-pay

## 2021-05-06 ENCOUNTER — Encounter: Payer: Self-pay | Admitting: Hematology and Oncology

## 2021-05-06 VITALS — BP 133/76 | HR 76 | Temp 97.7°F | Resp 18 | Ht 63.5 in | Wt 146.0 lb

## 2021-05-06 DIAGNOSIS — N83202 Unspecified ovarian cyst, left side: Secondary | ICD-10-CM | POA: Diagnosis not present

## 2021-05-06 DIAGNOSIS — N183 Chronic kidney disease, stage 3 unspecified: Secondary | ICD-10-CM | POA: Insufficient documentation

## 2021-05-06 DIAGNOSIS — C561 Malignant neoplasm of right ovary: Secondary | ICD-10-CM | POA: Diagnosis not present

## 2021-05-06 DIAGNOSIS — C786 Secondary malignant neoplasm of retroperitoneum and peritoneum: Secondary | ICD-10-CM | POA: Insufficient documentation

## 2021-05-06 DIAGNOSIS — Z79899 Other long term (current) drug therapy: Secondary | ICD-10-CM | POA: Insufficient documentation

## 2021-05-06 DIAGNOSIS — Z881 Allergy status to other antibiotic agents status: Secondary | ICD-10-CM | POA: Insufficient documentation

## 2021-05-06 DIAGNOSIS — R188 Other ascites: Secondary | ICD-10-CM | POA: Insufficient documentation

## 2021-05-06 DIAGNOSIS — I700 Atherosclerosis of aorta: Secondary | ICD-10-CM | POA: Diagnosis not present

## 2021-05-06 DIAGNOSIS — C50212 Malignant neoplasm of upper-inner quadrant of left female breast: Secondary | ICD-10-CM

## 2021-05-06 DIAGNOSIS — Z90721 Acquired absence of ovaries, unilateral: Secondary | ICD-10-CM | POA: Diagnosis not present

## 2021-05-06 DIAGNOSIS — E1122 Type 2 diabetes mellitus with diabetic chronic kidney disease: Secondary | ICD-10-CM | POA: Insufficient documentation

## 2021-05-06 DIAGNOSIS — Z170 Estrogen receptor positive status [ER+]: Secondary | ICD-10-CM | POA: Diagnosis not present

## 2021-05-06 DIAGNOSIS — R600 Localized edema: Secondary | ICD-10-CM | POA: Insufficient documentation

## 2021-05-06 DIAGNOSIS — Z95828 Presence of other vascular implants and grafts: Secondary | ICD-10-CM

## 2021-05-06 DIAGNOSIS — R7989 Other specified abnormal findings of blood chemistry: Secondary | ICD-10-CM | POA: Diagnosis not present

## 2021-05-06 DIAGNOSIS — C569 Malignant neoplasm of unspecified ovary: Secondary | ICD-10-CM | POA: Diagnosis not present

## 2021-05-06 DIAGNOSIS — M47816 Spondylosis without myelopathy or radiculopathy, lumbar region: Secondary | ICD-10-CM | POA: Diagnosis not present

## 2021-05-06 DIAGNOSIS — Z888 Allergy status to other drugs, medicaments and biological substances status: Secondary | ICD-10-CM | POA: Insufficient documentation

## 2021-05-06 DIAGNOSIS — D61818 Other pancytopenia: Secondary | ICD-10-CM | POA: Diagnosis not present

## 2021-05-06 DIAGNOSIS — Z79811 Long term (current) use of aromatase inhibitors: Secondary | ICD-10-CM | POA: Insufficient documentation

## 2021-05-06 LAB — CBC WITH DIFFERENTIAL/PLATELET
Abs Immature Granulocytes: 0.01 10*3/uL (ref 0.00–0.07)
Basophils Absolute: 0 10*3/uL (ref 0.0–0.1)
Basophils Relative: 0 %
Eosinophils Absolute: 0.1 10*3/uL (ref 0.0–0.5)
Eosinophils Relative: 3 %
HCT: 32 % — ABNORMAL LOW (ref 36.0–46.0)
Hemoglobin: 10.7 g/dL — ABNORMAL LOW (ref 12.0–15.0)
Immature Granulocytes: 0 %
Lymphocytes Relative: 29 %
Lymphs Abs: 1.4 10*3/uL (ref 0.7–4.0)
MCH: 31.6 pg (ref 26.0–34.0)
MCHC: 33.4 g/dL (ref 30.0–36.0)
MCV: 94.4 fL (ref 80.0–100.0)
Monocytes Absolute: 0.5 10*3/uL (ref 0.1–1.0)
Monocytes Relative: 11 %
Neutro Abs: 2.7 10*3/uL (ref 1.7–7.7)
Neutrophils Relative %: 57 %
Platelets: 114 10*3/uL — ABNORMAL LOW (ref 150–400)
RBC: 3.39 MIL/uL — ABNORMAL LOW (ref 3.87–5.11)
RDW: 15.2 % (ref 11.5–15.5)
WBC: 4.7 10*3/uL (ref 4.0–10.5)
nRBC: 0 % (ref 0.0–0.2)

## 2021-05-06 LAB — COMPREHENSIVE METABOLIC PANEL
ALT: 25 U/L (ref 0–44)
AST: 30 U/L (ref 15–41)
Albumin: 3.6 g/dL (ref 3.5–5.0)
Alkaline Phosphatase: 114 U/L (ref 38–126)
Anion gap: 10 (ref 5–15)
BUN: 25 mg/dL — ABNORMAL HIGH (ref 8–23)
CO2: 27 mmol/L (ref 22–32)
Calcium: 9.7 mg/dL (ref 8.9–10.3)
Chloride: 106 mmol/L (ref 98–111)
Creatinine, Ser: 1.39 mg/dL — ABNORMAL HIGH (ref 0.44–1.00)
GFR, Estimated: 38 mL/min — ABNORMAL LOW (ref >60)
Glucose, Bld: 128 mg/dL — ABNORMAL HIGH (ref 70–99)
Potassium: 3.7 mmol/L (ref 3.5–5.1)
Sodium: 143 mmol/L (ref 135–145)
Total Bilirubin: 0.5 mg/dL (ref 0.3–1.2)
Total Protein: 6.6 g/dL (ref 6.5–8.1)

## 2021-05-06 MED ORDER — HEPARIN SOD (PORK) LOCK FLUSH 100 UNIT/ML IV SOLN
500.0000 [IU] | Freq: Once | INTRAVENOUS | Status: AC
  Administered 2021-05-06: 500 [IU]
  Filled 2021-05-06: qty 5

## 2021-05-06 MED ORDER — SODIUM CHLORIDE 0.9% FLUSH
10.0000 mL | Freq: Once | INTRAVENOUS | Status: AC
  Administered 2021-05-06: 10 mL
  Filled 2021-05-06: qty 10

## 2021-05-06 NOTE — Progress Notes (Signed)
[place] OFFICE PROGRESS NOTE  Patient Care Team: [name], MD as PCP - Kinnie Scales, OD as Referring Physician (Optometry) [name], MD as Consulting Physician (Dermatology) Latanya Maudlin, MD as Consulting Physician (Orthopedic Surgery) Inda Castle, MD (Inactive) as Consulting Physician (Gastroenterology) Awanda Mink Craige Cotta, RN as Oncology Nurse Navigator (Oncology) Heath Lark, MD as Consulting Physician (Hematology and Oncology) Madelon Lips, MD as Consulting Physician (Nephrology)  ASSESSMENT & PLAN:  Ovarian cancer [facility]) Pancytopenia has almost completely resolved However, on exam, her abdomen appears distended and bloated I am concerned about cancer recurrence I will order CT imaging next week I told her to hold off restarting olaparib  Pancytopenia, acquired [facility]) This is improved since we placed her treatment on hold Observe for now  CKD stage 3 due to type 2 diabetes mellitus ([place]) Her renal function is stable Observe  Edema of right lower leg She had significant multiple ultrasound venous Doppler evaluation to rule out DVT I suspect she may have recurrent pelvic disease As above, we will order CT imaging for assessment   Orders Placed This Encounter  Procedures  . CT ABDOMEN PELVIS W CONTRAST    Standing Status:   Future    Standing Expiration Date:   05/06/2022    Order Specific Question:   If indicated for the ordered procedure, I authorize the administration of contrast media per Radiology protocol    Answer:   Yes    Order Specific Question:   Preferred imaging location?    Answer:   [facility]    Order Specific Question:   Radiology Contrast Protocol - do NOT remove file path    Answer:   [email or web address]  . CA 125    Standing Status:   Standing    Number of Occurrences:   11    Standing Expiration Date:   05/06/2022    All questions were answered. The patient  knows to call the clinic with any problems, questions or concerns. The total time spent in the appointment was 25 minutes encounter with patients including review of chart and various tests results, discussions about plan of care and coordination of care plan   Heath Lark, MD 05/06/2021 3:56 PM  INTERVAL HISTORY: Please see below for problem oriented charting. She returns with her husband for further follow-up She felt that her right lower extremity is more swollen than usual Her abdomen also appears somewhat distended She denies recent constipation She has no pain  SUMMARY OF ONCOLOGIC HISTORY: Oncology History Overview Note  HRD positive   Breast cancer of upper-inner quadrant of left female breast ([place])  02/26/2016 Initial Diagnosis   Left breast biopsy 11:00 position: invasive ductal carcinoma with DCIS, ER 90%, PR 10%, HER-2 negative, Ki-67 30%, grade 2, 2.2 cm palpable lesion T2 N0 stage II a clinical stage   03/18/2016 Surgery   Left lumpectomy: Invasive ductal carcinoma, grade 2, 6.3 cm, with high-grade DCIS, margins negative, 0/4 lymph nodes negative, ER 90%, via 10%, HER-2 negative ratio 0.97, Ki-67 30%, T3 N0 stage IIB   03/25/2016 Procedure   Genetic testing is negative for pathogenic mutations within any of the 20 Genes on the breast/ovarian cancer panel   04/04/2016 Oncotype testing   Oncotype DX recurrence score 37, 25% 10 year distant risk of recurrence   04/17/2016 - 07/31/2016 Chemotherapy   Adjuvant chemotherapy with dose dense Adriamycin and Cytoxan followed by Abraxane weekly 8 ( discontinued due to neuropathy)   09/01/2016 - 09/26/2016 Radiation  Therapy   Adj XRT 1) Left breast: 42.5 Gy in 17 fractions. 2) Left breast boost: 7.5 Gy in 3 fractions.   12/02/2016 -  Anti-estrogen oral therapy   Anastrozole 1 mg daily   Ovarian cancer ([place])  04/03/2020 Imaging   US pelvis Complex cystic and solid mass in LEFT adnexa 12.5 cm diameter question cystic ovarian neoplasm;  recommend correlation with serum tumor markers and further evaluation by MR imaging with and without contrast.     04/04/2020 Imaging   US venous Doppler No evidence of deep venous thrombosis in the right lower extremity. Left common femoral vein also patent.   04/15/2020 Imaging   MRI pelvis 1. Large complex solid and cystic mass arising from the right adnexa measuring 10.8 by 11.5 by 11.1 cm. This has an aggressive appearance with extensive enhancing mural soft tissue components. Findings are highly suspicious for malignant ovarian neoplasm. 2. Extensive bilateral retroperitoneal and bilateral iliac adenopathy compatible with metastatic disease. 3. Signs of extensive peritoneal carcinomatosis including ascites, enhancement, thickening and nodularity of the peritoneal reflections, omental caking and bulky peritoneal nodularity. 4. Suspected serosal involvement of the dome of bladder with loss of normal fat plane. Cannot rule out mural invasion by tumor.   04/17/2020 Tumor Marker   Patient's tumor was tested for the following markers: CA-125 Results of the tumor marker test revealed 1762.   04/20/2020 Cancer Staging   Staging form: Ovary, Fallopian Tube, and Primary Peritoneal Carcinoma, AJCC 8th Edition - Clinical: FIGO Stage IIIC (cT3, cN1, cM0) - Signed by Heath Lark, MD on 04/20/2020   04/23/2020 Imaging   1. Redemonstrated dominant mixed solid and cystic mass arising from the vicinity of the right ovary measuring at least 11.5 x 10.7 cm, not significantly changed compared to prior MR, and consistent with primary ovarian malignancy.  2. Numerous bulky retroperitoneal, bilateral iliac, and pelvic sidewall lymph nodes. 3. Moderate volume ascites throughout the abdomen and pelvis with subtle thickening and nodularity throughout the peritoneum, and extensive bulky nodular metastatic disease of the omentum. 4. Constellation of findings is consistent with advanced nodal and peritoneal metastatic  disease. 5. There are prominent subcentimeter epicardial lymph nodes, nonspecific although suspicious for nodal metastatic disease. No definite nodal metastatic disease in the chest. Attention on follow-up. 6. There are multiple small subpleural nodules at the right lung base overlying the diaphragm, measuring up to 7 mm. These are generally nonspecific and less favored to represent pulmonary metastatic disease given distribution. Attention on follow-up. 7. Somewhat coarse contour of the liver, suggestive of cirrhosis, although out without overt morphologic stigmata. 8. Aortic Atherosclerosis (ICD10-I70.0).     04/24/2020 Procedure   Successful placement of a right internal jugular approach power injectable Port-A-Cath. The catheter is ready for immediate use.     05/02/2020 Tumor Marker   Patient's tumor was tested for the following markers: CA-125 Results of the tumor marker test revealed 1921   05/03/2020 - 12/12/2020 Chemotherapy   The patient had carboplatin and taxol for chemotherapy treatment.     05/08/2020 Procedure   Successful ultrasound-guided paracentesis yielding 2.6 liters of peritoneal fluid.   05/16/2020 Procedure   Successful ultrasound-guided paracentesis yielding 3.2 liters of peritoneal fluid   05/25/2020 Procedure   Successful ultrasound-guided paracentesis yielding 3 liters of peritoneal fluid.     06/01/2020 Procedure   Successful ultrasound-guided therapeutic paracentesis yielding 1.7 liters of peritoneal fluid.   06/14/2020 Tumor Marker   Patient's tumor was tested for the following markers: CA-125 Results of the  tumor marker test revealed 1309   06/20/2020 Imaging   1. Dominant mixed cystic and solid mass in the central pelvis is stable in the interval. 2. Clear interval decrease in retroperitoneal and pelvic sidewall lymphadenopathy. The bulky omental disease has also clearly decreased in the interval. 3. Interval decrease in ascites. 4. Stable 8 mm subpleural  nodule along the diaphragm. 5. Aortic Atherosclerosis (ICD10-I70.0).   07/10/2020 Tumor Marker   Patient's tumor was tested for the following markers: CA-125 Results of the tumor marker test revealed 220   08/03/2020 Tumor Marker   Patient's tumor was tested for the following markers: CA-125 Results of the tumor marker test revealed 53.3.   09/03/2020 Tumor Marker   Patient's tumor was tested for the following markers: CA-125 Results of the tumor marker test revealed 29.7   09/20/2020 Imaging   1. Substantial improvement. The right ovarian cystic and solid mass is half the volume that it measured on 06/20/2020. Similar reduction in the bulk of the omental caking of tumor. Prior ascites is resolved and the retroperitoneal adenopathy is markedly improved. 2. Other imaging findings of potential clinical significance: Stable pleural-based nodularity along the right hemidiaphragm measuring about 1.1 by 0.7 by 0.3 cm. Stable hypodense lesion of the right kidney upper pole, probably a cyst although the configuration of this lesion makes it difficult to obtain accurate density measurements. Left ovarian cyst, stable. Chondrocalcinosis involving the acetabular labra. Mild lumbar spondylosis and degenerative disc disease. 3. Aortic atherosclerosis.     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 (stage ypT3c), predominantly viable with focal fibrosis and hemosiderin laden macrophages (possible mild treatment effect)  B: Ovary and fallopian tube, right, salpingo-oophorectomy - High grade serous carcinoma involving right ovary and fallopian tube with surface involvement, size up to 8 cm  - Areas of necrosis and hemosiderin laden macrophages suggestive of treatment effect -  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 - See synoptic report and comment  C: Ovary and fallopian tube, left, salpingo-oophorectomy - High grade serous carcinoma involving the left fallopian tube, size up to 0.5 cm - Ovary with no definite involvement by carcinoma identified - Nodular area of endometriosis and peritoneal inclusion cyst also present - See synoptic report and comment   Procedure:    Bilateral salpingo-oophorectomy    Procedure:    Omentectomy    Specimen Integrity of Right Ovary:    Intact with surface involvement by tumor    Specimen Integrity of Left Ovary:    Capsule intact   TUMOR Tumor Site:    Right fallopian tube: favor as primary site; also involves right ovary and left fallopian tube  Histologic Type:    Serous carcinoma  Histologic Grade:    High grade  Tumor Size:    Greatest Dimension (Centimeters): in fallopian tube: 1.0 cm; in ovary: 8.0 cm Ovarian Surface Involvement:    Present    Laterality:    Right  Fallopian Tube Surface Involvement:    Present    Laterality:    Bilateral  Other Tissue / Organ Involvement:    Right ovary  Other Tissue / Organ Involvement:    Right fallopian tube  Other Tissue / Organ Involvement:    Left fallopian tube  Other Tissue / Organ Involvement:    Omentum  Largest  Extrapelvic Peritoneal Focus:    Macroscopic (greater than 2 cm)  Peritoneal / Ascitic Fluid:    Not submitted / unknown  Pleural Fluid:    Not submitted / unknown  Treatment Effect:    No definite or minimal response identified (chemotherapy response score [CRS] 1)   LYMPH NODES Regional Lymph Nodes:    No lymph nodes submitted or found   PATHOLOGIC STAGE CLASSIFICATION (pTNM, AJCC 8th Edition) TNM Descriptors:    y (post-treatment)  Primary Tumor (pT):    pT3c  Regional Lymph Nodes (pN):    pNX   FIGO STAGE FIGO Stage:    IIIC   ADDITIONAL FINDINGS Additional Findings:    Serous tubal intraepithelial carcinoma  (STIC)  Additional Findings:    Left ovary with no definite carcinoma identified, left-sided nodule of endometriosis and peritoneal inclusion cyst    10/18/2020 - 10/20/2020 Hospital Admission   She was admitted to [place] for interval debulking surgery   11/19/2020 Tumor Marker   Patient's tumor was tested for the following markers: CA-125. Results of the tumor marker test revealed 38.3   12/12/2020 Tumor Marker   Patient's tumor was tested for the following markers CA-125. Results of the tumor marker test revealed 42.3.   01/02/2021 Tumor Marker   Patient's tumor was tested for the following markers: CA-125. Results of the tumor marker test revealed 37.3   01/29/2021 Tumor Marker   Patient's tumor was tested for the following markers: CA-125 Results of the tumor marker test revealed 44.3   01/29/2021 Imaging   1. Interval increase in loculated appearing ascites in the low central abdomen and pelvis. 2. There is extensive peritoneal nodularity surrounding this fluid, the nodularity itself not appreciably changed in appearance compared to prior examination. 3. Additional peritoneal nodularity and/or mesenteric lymph nodes are unchanged. 4. Interval decrease in size of a low-attenuation nodule overlying the right hemidiaphragm, now measuring 1.2 cm, previously 1.8 cm when measured similarly. Findings are consistent with treatment response of a metastatic nodule. No other evidence of intrathoracic metastatic disease. 5. Status post hysterectomy, oophorectomy, and omentectomy.   01/31/2021 -  Chemotherapy   She is started on Olaparib       03/04/2021 Tumor Marker   Patient's tumor was tested for the following markers: CA-125 Results of the tumor marker test revealed 39.7   04/01/2021 Tumor Marker   Patient's tumor was tested for the following markers: CA-125 Results of the tumor marker test revealed 32.3     REVIEW OF SYSTEMS:   Constitutional: Denies fevers, chills or abnormal  weight loss Eyes: Denies blurriness of vision Ears, nose, mouth, throat, and face: Denies mucositis or sore throat Respiratory: Denies cough, dyspnea or wheezes Cardiovascular: Denies palpitation, chest discomfort  Gastrointestinal:  Denies nausea, heartburn or change in bowel habits Skin: Denies abnormal skin rashes Lymphatics: Denies new lymphadenopathy or easy bruising Neurological:Denies numbness, tingling or new weaknesses Behavioral/Psych: Mood is stable, no new changes  All other systems were reviewed with the patient and are negative.  I have reviewed the past medical history, past surgical history, social history and family history with the patient and they are unchanged from previous note.  ALLERGIES:  is allergic to keflex [cephalexin], meloxicam, prednisone, premarin [estrogens conjugated], and trovan [alatrofloxacin].  MEDICATIONS:  Current Outpatient Medications  Medication Sig Dispense Refill  . acetaminophen (TYLENOL) 325 MG tablet Take 325 mg by mouth every 6 (six) hours as needed for headache.    . allopurinol (ZYLOPRIM) 300 MG tablet TAKE  1 TABLET BY MOUTH DAILY FOR GOUT PREVENTION 90 tablet 1  . blood glucose meter kit and supplies KIT Dispense based on patient and insurance preference. Use to check blood sugar once daily. Dx: E11.22, N18.30 1 each 12  . Cholecalciferol (VITAMIN D3) 5000 units CAPS Take 5,000 Units by mouth daily.    . cyanocobalamin 1000 MCG tablet Take 1,000 mcg by mouth daily.    Marland Kitchen ezetimibe (ZETIA) 10 MG tablet Patient knows to take by mouth  !   - Thanks 90 tablet 3  . gabapentin (NEURONTIN) 300 MG capsule Take 1 capsule in the morning and afternoon and 2 capsules at night 360 capsule 3  . glimepiride (AMARYL) 1 MG tablet Take 1 tab by mouth in the morning if fasting is running 150+. Please do not take this medication if you are ill or not eating as this can cause a low blood sugar. 90 tablet 1  . glucose blood (FREESTYLE TEST STRIPS) test strip  Check Blood Sugar Daily as directed (Dx: E11.29) 100 strip 3  . hydrochlorothiazide (HYDRODIURIL) 25 MG tablet Take  1 tablet  Daily  for BP and Fluid Retention /Ankle Swelling 90 tablet 0  . ketoconazole (NIZORAL) 2 % cream Apply 1 application topically 2 (two) times daily. To groin rash 60 g 0  . Lancets (FREESTYLE) lancets CHECK BLOOD SUGAR DAILY AS DIRECTED 100 each 3  . lidocaine-prilocaine (EMLA) cream Apply to affected area once (Patient taking differently: Apply 1 application topically daily as needed (port access).) 30 g 3  . loratadine (CLARITIN) 10 MG tablet Take 10 mg by mouth daily as needed for allergies.    . Magnesium 250 MG TABS Take 250 mg by mouth every other day.    . metoprolol succinate (TOPROL-XL) 25 MG 24 hr tablet TAKE 1 TABLET BY MOUTH DAILY FOR BLOOD PRESSURE (Patient taking differently: Take 25 mg by mouth daily.) 90 tablet 3  . olaparib (LYNPARZA) 100 MG tablet Take 1 tablet (100 mg total) by mouth 2 (two) times daily. Swallow whole. May take with food to decrease nausea and vomiting. 60 tablet 11  . omeprazole (PRILOSEC) 20 MG capsule Take 1 capsule (20 mg total) by mouth daily. 30 capsule 11  . ondansetron (ZOFRAN) 8 MG tablet Take 1 tablet (8 mg total) by mouth 2 (two) times daily as needed for refractory nausea / vomiting. Start on day 3 after carboplatin chemo. 30 tablet 1  . prochlorperazine (COMPAZINE) 10 MG tablet Take 1 tablet (10 mg total) by mouth every 6 (six) hours as needed for nausea or vomiting. 60 tablet 9  . rosuvastatin (CRESTOR) 5 MG tablet Take 1 tablet (5 mg total) by mouth 3 (three) times a week. In the evening for cholesterol. 36 tablet 3  . vitamin C (ASCORBIC ACID) 500 MG tablet Take 500 mg by mouth every evening.      No current facility-administered medications for this visit.    PHYSICAL EXAMINATION: ECOG PERFORMANCE STATUS: 1 - Symptomatic but completely ambulatory  Vitals:   05/06/21 1402  BP: 133/76  Pulse: 76  Resp: 18  Temp:  97.7 F (36.5 C)  SpO2: 100%   Filed Weights   05/06/21 1402  Weight: 146 lb (66.2 kg)    GENERAL:alert, no distress and comfortable SKIN: skin color, texture, turgor are normal, no rashes or significant lesions EYES: normal, Conjunctiva are pink and non-injected, sclera clear OROPHARYNX:no exudate, no erythema and lips, buccal mucosa, and tongue normal  NECK: supple, thyroid normal  size, non-tender, without nodularity LYMPH:  no palpable lymphadenopathy in the cervical, axillary or inguinal LUNGS: clear to auscultation and percussion with normal breathing effort HEART: regular rate & rhythm and no murmurs with moderate right lower extremity edema ABDOMEN:abdomen soft, distended and bloated.  No ascites Musculoskeletal:no cyanosis of digits and no clubbing  NEURO: alert & oriented x 3 with fluent speech, no focal motor/sensory deficits  LABORATORY DATA:  I have reviewed the data as listed    Component Value Date/Time   NA 143 05/06/2021 1344   NA 141 11/06/2016 1102   K 3.7 05/06/2021 1344   K 3.9 11/06/2016 1102   CL 106 05/06/2021 1344   CO2 27 05/06/2021 1344   CO2 27 11/06/2016 1102   GLUCOSE 128 (H) 05/06/2021 1344   GLUCOSE 130 11/06/2016 1102   BUN 25 (H) 05/06/2021 1344   BUN 18.0 11/06/2016 1102   CREATININE 1.39 (H) 05/06/2021 1344   CREATININE 1.34 (H) 03/20/2021 0908   CREATININE 1.42 (H) 02/21/2021 1414   CREATININE 1.3 (H) 11/06/2016 1102   CALCIUM 9.7 05/06/2021 1344   CALCIUM 10.2 11/06/2016 1102   PROT 6.6 05/06/2021 1344   PROT 7.1 11/06/2016 1102   ALBUMIN 3.6 05/06/2021 1344   ALBUMIN 3.7 11/06/2016 1102   AST 30 05/06/2021 1344   AST 35 03/20/2021 0908   AST 52 (H) 11/06/2016 1102   ALT 25 05/06/2021 1344   ALT 25 03/20/2021 0908   ALT 58 (H) 11/06/2016 1102   ALKPHOS 114 05/06/2021 1344   ALKPHOS 159 (H) 11/06/2016 1102   BILITOT 0.5 05/06/2021 1344   BILITOT 0.5 03/20/2021 0908   BILITOT 0.53 11/06/2016 1102   GFRNONAA 38 (L) 05/06/2021  1344   GFRNONAA 40 (L) 03/20/2021 0908   GFRNONAA 34 (L) 02/21/2021 1414   GFRAA 40 (L) 02/21/2021 1414    No results found for: SPEP, UPEP  Lab Results  Component Value Date   WBC 4.7 05/06/2021   NEUTROABS 2.7 05/06/2021   HGB 10.7 (L) 05/06/2021   HCT 32.0 (L) 05/06/2021   MCV 94.4 05/06/2021   PLT 114 (L) 05/06/2021      Chemistry      Component Value Date/Time   NA 143 05/06/2021 1344   NA 141 11/06/2016 1102   K 3.7 05/06/2021 1344   K 3.9 11/06/2016 1102   CL 106 05/06/2021 1344   CO2 27 05/06/2021 1344   CO2 27 11/06/2016 1102   BUN 25 (H) 05/06/2021 1344   BUN 18.0 11/06/2016 1102   CREATININE 1.39 (H) 05/06/2021 1344   CREATININE 1.34 (H) 03/20/2021 0908   CREATININE 1.42 (H) 02/21/2021 1414   CREATININE 1.3 (H) 11/06/2016 1102      Component Value Date/Time   CALCIUM 9.7 05/06/2021 1344   CALCIUM 10.2 11/06/2016 1102   ALKPHOS 114 05/06/2021 1344   ALKPHOS 159 (H) 11/06/2016 1102   AST 30 05/06/2021 1344   AST 35 03/20/2021 0908   AST 52 (H) 11/06/2016 1102   ALT 25 05/06/2021 1344   ALT 25 03/20/2021 0908   ALT 58 (H) 11/06/2016 1102   BILITOT 0.5 05/06/2021 1344   BILITOT 0.5 03/20/2021 0908   BILITOT 0.53 11/06/2016 1102

## 2021-05-06 NOTE — Telephone Encounter (Signed)
-----   Message from Heath Lark, MD sent at 05/06/2021  3:57 PM EDT ----- Pls schedule appt to see me Monday next week at 215 pm, 45 mins

## 2021-05-06 NOTE — Assessment & Plan Note (Signed)
Pancytopenia has almost completely resolved However, on exam, her abdomen appears distended and bloated I am concerned about cancer recurrence I will order CT imaging next week I told her to hold off restarting olaparib

## 2021-05-06 NOTE — Assessment & Plan Note (Signed)
She had significant multiple ultrasound venous Doppler evaluation to rule out DVT I suspect she may have recurrent pelvic disease As above, we will order CT imaging for assessment

## 2021-05-06 NOTE — Assessment & Plan Note (Signed)
Her renal function is stable Observe

## 2021-05-06 NOTE — Assessment & Plan Note (Signed)
This is improved since we placed her treatment on hold Observe for now

## 2021-05-06 NOTE — Telephone Encounter (Signed)
Called and left a message appt scheduled for Monday 6/13 at 2:15 pm w/ Dr. [name]. Ask her to call the office if needed.

## 2021-05-10 ENCOUNTER — Encounter (HOSPITAL_BASED_OUTPATIENT_CLINIC_OR_DEPARTMENT_OTHER): Payer: Self-pay

## 2021-05-10 ENCOUNTER — Ambulatory Visit (HOSPITAL_BASED_OUTPATIENT_CLINIC_OR_DEPARTMENT_OTHER)
Admission: RE | Admit: 2021-05-10 | Discharge: 2021-05-10 | Disposition: A | Discharge status: Home / Self Care | Payer: PPO | Source: Ambulatory Visit | Attending: Hematology and Oncology | Admitting: Hematology and Oncology

## 2021-05-10 ENCOUNTER — Other Ambulatory Visit: Payer: Self-pay

## 2021-05-10 DIAGNOSIS — C569 Malignant neoplasm of unspecified ovary: Secondary | ICD-10-CM

## 2021-05-10 DIAGNOSIS — R600 Localized edema: Secondary | ICD-10-CM | POA: Diagnosis not present

## 2021-05-10 DIAGNOSIS — C50212 Malignant neoplasm of upper-inner quadrant of left female breast: Secondary | ICD-10-CM

## 2021-05-10 DIAGNOSIS — Z170 Estrogen receptor positive status [ER+]: Secondary | ICD-10-CM | POA: Diagnosis not present

## 2021-05-10 DIAGNOSIS — N281 Cyst of kidney, acquired: Secondary | ICD-10-CM | POA: Diagnosis not present

## 2021-05-10 MED ORDER — IOHEXOL 300 MG/ML  SOLN
70.0000 mL | Freq: Once | INTRAMUSCULAR | Status: AC | PRN
  Administered 2021-05-10: 70 mL via INTRAVENOUS

## 2021-05-13 ENCOUNTER — Other Ambulatory Visit: Payer: Self-pay

## 2021-05-13 ENCOUNTER — Inpatient Hospital Stay: Payer: PPO | Admitting: Hematology and Oncology

## 2021-05-13 DIAGNOSIS — C561 Malignant neoplasm of right ovary: Secondary | ICD-10-CM | POA: Diagnosis not present

## 2021-05-13 DIAGNOSIS — D61818 Other pancytopenia: Secondary | ICD-10-CM | POA: Diagnosis not present

## 2021-05-13 DIAGNOSIS — C569 Malignant neoplasm of unspecified ovary: Secondary | ICD-10-CM

## 2021-05-14 ENCOUNTER — Encounter: Payer: Self-pay | Admitting: Hematology and Oncology

## 2021-05-14 ENCOUNTER — Ambulatory Visit: Payer: PPO | Admitting: Hematology and Oncology

## 2021-05-14 MED ORDER — OLAPARIB 100 MG PO TABS: 100.0000 mg | ORAL_TABLET | Freq: Every day | ORAL | 11 refills | Status: DC

## 2021-05-14 NOTE — Progress Notes (Signed)
[facility] OFFICE PROGRESS NOTE  Patient Care Team: [name], MD as PCP - Kinnie Scales, OD as Referring Physician (Optometry) [name], MD as Consulting Physician (Dermatology) Latanya Maudlin, MD as Consulting Physician (Orthopedic Surgery) Inda Castle, MD (Inactive) as Consulting Physician (Gastroenterology) Jacqulyn Liner, RN as Oncology Nurse Navigator (Oncology) Heath Lark, MD as Consulting Physician (Hematology and Oncology) Madelon Lips, MD as Consulting Physician (Nephrology)  ASSESSMENT & PLAN:  Ovarian cancer [facility]) I have reviewed CT imaging with her and husband Her recent tumor marker was normal The measurable disease on her CT imaging is smaller She is not symptomatic We discussed the risk and benefits of reducing olaparib further Even though this is not in package insert, we can consider olaparib 100 mg daily instead of twice daily She is in agreement to proceed I will get her to return next week for lab appointment to check her blood count I recommend close monitoring with another surveillance imaging in September  Pancytopenia, acquired [facility]) This is improved since we placed her treatment on hold Observe for now We will monitor her labs closely while on reduced dose olaparib  No orders of the defined types were placed in this encounter.   All questions were answered. The patient knows to call the clinic with any problems, questions or concerns. The total time spent in the appointment was 30 minutes encounter with patients including review of chart and various tests results, discussions about plan of care and coordination of care plan   Heath Lark, MD 05/14/2021 7:43 AM  INTERVAL HISTORY: Please see below for problem oriented charting. She returns with her husband for further follow-up She is doing well She has no new abdominal symptoms  SUMMARY OF ONCOLOGIC HISTORY: Oncology History Overview Note  HRD positive    Breast cancer of upper-inner quadrant of left female breast ([place])  02/26/2016 Initial Diagnosis   Left breast biopsy 11:00 position: invasive ductal carcinoma with DCIS, ER 90%, PR 10%, HER-2 negative, Ki-67 30%, grade 2, 2.2 cm palpable lesion T2 N0 stage II a clinical stage    03/18/2016 Surgery   Left lumpectomy: Invasive ductal carcinoma, grade 2, 6.3 cm, with high-grade DCIS, margins negative, 0/4 lymph nodes negative, ER 90%, via 10%, HER-2 negative ratio 0.97, Ki-67 30%, T3 N0 stage IIB    03/25/2016 Procedure   Genetic testing is negative for pathogenic mutations within any of the 20 Genes on the breast/ovarian cancer panel    04/04/2016 Oncotype testing   Oncotype DX recurrence score 37, 25% 10 year distant risk of recurrence    04/17/2016 - 07/31/2016 Chemotherapy   Adjuvant chemotherapy with dose dense Adriamycin and Cytoxan followed by Abraxane weekly 8 ( discontinued due to neuropathy)   09/01/2016 - 09/26/2016 Radiation Therapy   Adj XRT 1) Left breast: 42.5 Gy in 17 fractions. 2) Left breast boost: 7.5 Gy in 3 fractions.    12/02/2016 -  Anti-estrogen oral therapy   Anastrozole 1 mg daily    Ovarian cancer ([place])  04/03/2020 Imaging   US pelvis Complex cystic and solid mass in LEFT adnexa 12.5 cm diameter question cystic ovarian neoplasm; recommend correlation with serum tumor markers and further evaluation by MR imaging with and without contrast.     04/04/2020 Imaging   US venous Doppler No evidence of deep venous thrombosis in the right lower extremity. Left common femoral vein also patent.   04/15/2020 Imaging   MRI pelvis 1. Large complex solid and cystic mass  arising from the right adnexa measuring 10.8 by 11.5 by 11.1 cm. This has an aggressive appearance with extensive enhancing mural soft tissue components. Findings are highly suspicious for malignant ovarian neoplasm. 2. Extensive bilateral retroperitoneal and bilateral iliac adenopathy compatible with metastatic  disease. 3. Signs of extensive peritoneal carcinomatosis including ascites, enhancement, thickening and nodularity of the peritoneal reflections, omental caking and bulky peritoneal nodularity. 4. Suspected serosal involvement of the dome of bladder with loss of normal fat plane. Cannot rule out mural invasion by tumor.   04/17/2020 Tumor Marker   Patient's tumor was tested for the following markers: CA-125 Results of the tumor marker test revealed 1762.   04/20/2020 Cancer Staging   Staging form: Ovary, Fallopian Tube, and Primary Peritoneal Carcinoma, AJCC 8th Edition - Clinical: FIGO Stage IIIC (cT3, cN1, cM0) - Signed by Heath Lark, MD on 04/20/2020    04/23/2020 Imaging   1. Redemonstrated dominant mixed solid and cystic mass arising from the vicinity of the right ovary measuring at least 11.5 x 10.7 cm, not significantly changed compared to prior MR, and consistent with primary ovarian malignancy.  2. Numerous bulky retroperitoneal, bilateral iliac, and pelvic sidewall lymph nodes. 3. Moderate volume ascites throughout the abdomen and pelvis with subtle thickening and nodularity throughout the peritoneum, and extensive bulky nodular metastatic disease of the omentum. 4. Constellation of findings is consistent with advanced nodal and peritoneal metastatic disease. 5. There are prominent subcentimeter epicardial lymph nodes, nonspecific although suspicious for nodal metastatic disease. No definite nodal metastatic disease in the chest. Attention on follow-up. 6. There are multiple small subpleural nodules at the right lung base overlying the diaphragm, measuring up to 7 mm. These are generally nonspecific and less favored to represent pulmonary metastatic disease given distribution. Attention on follow-up. 7. Somewhat coarse contour of the liver, suggestive of cirrhosis, although out without overt morphologic stigmata. 8. Aortic Atherosclerosis (ICD10-I70.0).     04/24/2020 Procedure    Successful placement of a right internal jugular approach power injectable Port-A-Cath. The catheter is ready for immediate use.     05/02/2020 Tumor Marker   Patient's tumor was tested for the following markers: CA-125 Results of the tumor marker test revealed 1921   05/03/2020 - 12/12/2020 Chemotherapy   The patient had carboplatin and taxol for chemotherapy treatment.     05/08/2020 Procedure   Successful ultrasound-guided paracentesis yielding 2.6 liters of peritoneal fluid.   05/16/2020 Procedure   Successful ultrasound-guided paracentesis yielding 3.2 liters of peritoneal fluid   05/25/2020 Procedure   Successful ultrasound-guided paracentesis yielding 3 liters of peritoneal fluid.     06/01/2020 Procedure   Successful ultrasound-guided therapeutic paracentesis yielding 1.7 liters of peritoneal fluid.   06/14/2020 Tumor Marker   Patient's tumor was tested for the following markers: CA-125 Results of the tumor marker test revealed 1309   06/20/2020 Imaging   1. Dominant mixed cystic and solid mass in the central pelvis is stable in the interval. 2. Clear interval decrease in retroperitoneal and pelvic sidewall lymphadenopathy. The bulky omental disease has also clearly decreased in the interval. 3. Interval decrease in ascites. 4. Stable 8 mm subpleural nodule along the diaphragm. 5. Aortic Atherosclerosis (ICD10-I70.0).   07/10/2020 Tumor Marker   Patient's tumor was tested for the following markers: CA-125 Results of the tumor marker test revealed 220   08/03/2020 Tumor Marker   Patient's tumor was tested for the following markers: CA-125 Results of the tumor marker test revealed 53.3.   09/03/2020 Tumor Marker  Patient's tumor was tested for the following markers: CA-125 Results of the tumor marker test revealed 29.7   09/20/2020 Imaging   1. Substantial improvement. The right ovarian cystic and solid mass is half the volume that it measured on 06/20/2020. Similar reduction in  the bulk of the omental caking of tumor. Prior ascites is resolved and the retroperitoneal adenopathy is markedly improved. 2. Other imaging findings of potential clinical significance: Stable pleural-based nodularity along the right hemidiaphragm measuring about 1.1 by 0.7 by 0.3 cm. Stable hypodense lesion of the right kidney upper pole, probably a cyst although the configuration of this lesion makes it difficult to obtain accurate density measurements. Left ovarian cyst, stable. Chondrocalcinosis involving the acetabular labra. Mild lumbar spondylosis and degenerative disc disease. 3. Aortic atherosclerosis.     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 (stage ypT3c), predominantly viable with focal fibrosis and hemosiderin laden macrophages (possible mild treatment effect)  B: Ovary and fallopian tube, right, salpingo-oophorectomy - High grade serous carcinoma involving right ovary and fallopian tube with surface involvement, size up to 8 cm  - Areas of necrosis and hemosiderin laden macrophages suggestive of treatment effect - 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 - See synoptic report and comment  C: Ovary and fallopian tube, left, salpingo-oophorectomy - High grade serous carcinoma involving the left fallopian tube, size up to 0.5 cm - Ovary with no definite involvement by carcinoma identified - Nodular area of endometriosis and peritoneal inclusion cyst also present - See synoptic report and comment   Procedure:    Bilateral salpingo-oophorectomy    Procedure:    Omentectomy    Specimen Integrity of Right Ovary:    Intact with surface involvement by tumor    Specimen Integrity of Left Ovary:    Capsule intact    TUMOR Tumor Site:    Right fallopian tube: favor as primary site; also involves right ovary and left fallopian tube  Histologic Type:    Serous carcinoma  Histologic Grade:    High grade  Tumor Size:    Greatest Dimension (Centimeters): in fallopian tube: 1.0 cm; in ovary: 8.0 cm Ovarian Surface Involvement:    Present    Laterality:    Right  Fallopian Tube Surface Involvement:    Present    Laterality:    Bilateral  Other Tissue / Organ Involvement:    Right ovary  Other Tissue / Organ Involvement:    Right fallopian tube  Other Tissue / Organ Involvement:    Left fallopian tube  Other Tissue / Organ Involvement:    Omentum  Largest Extrapelvic Peritoneal Focus:    Macroscopic (greater than 2 cm)  Peritoneal / Ascitic Fluid:    Not submitted / unknown  Pleural Fluid:    Not submitted / unknown  Treatment Effect:    No definite or minimal response identified (chemotherapy response score [CRS] 1)   LYMPH NODES Regional Lymph Nodes:    No lymph nodes submitted or found   PATHOLOGIC STAGE CLASSIFICATION (pTNM, AJCC 8th Edition) TNM Descriptors:    y (post-treatment)  Primary Tumor (pT):    pT3c  Regional Lymph Nodes (pN):    pNX   FIGO STAGE FIGO Stage:    IIIC   ADDITIONAL FINDINGS Additional Findings:    Serous tubal intraepithelial  carcinoma (STIC)  Additional Findings:    Left ovary with no definite carcinoma identified, left-sided nodule of endometriosis and peritoneal inclusion cyst    10/18/2020 - 10/20/2020 Hospital Admission   She was admitted to [facility] for interval debulking surgery   11/19/2020 Tumor Marker   Patient's tumor was tested for the following markers: CA-125. Results of the tumor marker test revealed 38.3   12/12/2020 Tumor Marker   Patient's tumor was tested for the following markers CA-125. Results of the tumor marker test revealed 42.3.   01/02/2021 Tumor Marker   Patient's tumor was tested for the following markers: CA-125. Results of the  tumor marker test revealed 37.3   01/29/2021 Tumor Marker   Patient's tumor was tested for the following markers: CA-125 Results of the tumor marker test revealed 44.3   01/29/2021 Imaging   1. Interval increase in loculated appearing ascites in the low central abdomen and pelvis. 2. There is extensive peritoneal nodularity surrounding this fluid, the nodularity itself not appreciably changed in appearance compared to prior examination. 3. Additional peritoneal nodularity and/or mesenteric lymph nodes are unchanged. 4. Interval decrease in size of a low-attenuation nodule overlying the right hemidiaphragm, now measuring 1.2 cm, previously 1.8 cm when measured similarly. Findings are consistent with treatment response of a metastatic nodule. No other evidence of intrathoracic metastatic disease. 5. Status post hysterectomy, oophorectomy, and omentectomy.   01/31/2021 -  Chemotherapy   She is started on Olaparib       03/04/2021 Tumor Marker   Patient's tumor was tested for the following markers: CA-125 Results of the tumor marker test revealed 39.7   04/01/2021 Tumor Marker   Patient's tumor was tested for the following markers: CA-125 Results of the tumor marker test revealed 32.3   05/10/2021 Imaging   1. No significant change in peritoneal carcinomatosis since previous study of 3 months ago, primarily in the pelvis where there is complex fluid and peritoneal nodularity. 2. No evidence of solid visceral organ metastasis. No evidence of bowel or ureteral obstruction. 3.  Aortic Atherosclerosis (ICD10-I70.0).     REVIEW OF SYSTEMS:   Constitutional: Denies fevers, chills or abnormal weight loss Eyes: Denies blurriness of vision Ears, nose, mouth, throat, and face: Denies mucositis or sore throat Respiratory: Denies cough, dyspnea or wheezes Cardiovascular: Denies palpitation, chest discomfort or lower extremity swelling Gastrointestinal:  Denies nausea, heartburn or change in bowel  habits Skin: Denies abnormal skin rashes Lymphatics: Denies new lymphadenopathy or easy bruising Neurological:Denies numbness, tingling or new weaknesses Behavioral/Psych: Mood is stable, no new changes  All other systems were reviewed with the patient and are negative.  I have reviewed the past medical history, past surgical history, social history and family history with the patient and they are unchanged from previous note.  ALLERGIES:  is allergic to keflex [cephalexin], meloxicam, prednisone, premarin [estrogens conjugated], and trovan [alatrofloxacin].  MEDICATIONS:  Current Outpatient Medications  Medication Sig Dispense Refill   acetaminophen (TYLENOL) 325 MG tablet Take 325 mg by mouth every 6 (six) hours as needed for headache.     allopurinol (ZYLOPRIM) 300 MG tablet TAKE 1 TABLET BY MOUTH DAILY FOR GOUT PREVENTION 90 tablet 1   blood glucose meter kit and supplies KIT Dispense based on patient and insurance preference. Use to check blood sugar once daily. Dx: E11.22, N18.30 1 each 12   Cholecalciferol (VITAMIN D3) 5000 units CAPS Take 5,000 Units by mouth daily.     cyanocobalamin 1000 MCG tablet Take 1,000 mcg by  mouth daily.     ezetimibe (ZETIA) 10 MG tablet Patient knows to take by mouth  !   - Thanks 90 tablet 3   gabapentin (NEURONTIN) 300 MG capsule Take 1 capsule in the morning and afternoon and 2 capsules at night 360 capsule 3   glimepiride (AMARYL) 1 MG tablet Take 1 tab by mouth in the morning if fasting is running 150+. Please do not take this medication if you are ill or not eating as this can cause a low blood sugar. 90 tablet 1   glucose blood (FREESTYLE TEST STRIPS) test strip Check Blood Sugar Daily as directed (Dx: E11.29) 100 strip 3   hydrochlorothiazide (HYDRODIURIL) 25 MG tablet Take  1 tablet  Daily  for BP and Fluid Retention /Ankle Swelling 90 tablet 0   ketoconazole (NIZORAL) 2 % cream Apply 1 application topically 2 (two) times daily. To groin rash 60 g  0   Lancets (FREESTYLE) lancets CHECK BLOOD SUGAR DAILY AS DIRECTED 100 each 3   lidocaine-prilocaine (EMLA) cream Apply to affected area once (Patient taking differently: Apply 1 application topically daily as needed (port access).) 30 g 3   loratadine (CLARITIN) 10 MG tablet Take 10 mg by mouth daily as needed for allergies.     Magnesium 250 MG TABS Take 250 mg by mouth every other day.     metoprolol succinate (TOPROL-XL) 25 MG 24 hr tablet TAKE 1 TABLET BY MOUTH DAILY FOR BLOOD PRESSURE (Patient taking differently: Take 25 mg by mouth daily.) 90 tablet 3   olaparib (LYNPARZA) 100 MG tablet Take 1 tablet (100 mg total) by mouth daily. Swallow whole. May take with food to decrease nausea and vomiting. 60 tablet 11   omeprazole (PRILOSEC) 20 MG capsule Take 1 capsule (20 mg total) by mouth daily. 30 capsule 11   ondansetron (ZOFRAN) 8 MG tablet Take 1 tablet (8 mg total) by mouth 2 (two) times daily as needed for refractory nausea / vomiting. Start on day 3 after carboplatin chemo. 30 tablet 1   prochlorperazine (COMPAZINE) 10 MG tablet Take 1 tablet (10 mg total) by mouth every 6 (six) hours as needed for nausea or vomiting. 60 tablet 9   rosuvastatin (CRESTOR) 5 MG tablet Take 1 tablet (5 mg total) by mouth 3 (three) times a week. In the evening for cholesterol. 36 tablet 3   vitamin C (ASCORBIC ACID) 500 MG tablet Take 500 mg by mouth every evening.      No current facility-administered medications for this visit.    PHYSICAL EXAMINATION: ECOG PERFORMANCE STATUS: 1 - Symptomatic but completely ambulatory  Vitals:   05/13/21 1420  BP: 140/67  Pulse: 74  Resp: 18  Temp: (!) 97.4 F (36.3 C)  SpO2: 100%   Filed Weights   05/13/21 1420  Weight: 142 lb 3.2 oz (64.5 kg)    GENERAL:alert, no distress and comfortable SKIN: skin color, texture, turgor are normal, no rashes or significant lesions EYES: normal, Conjunctiva are pink and non-injected, sclera clear OROPHARYNX:no exudate,  no erythema and lips, buccal mucosa, and tongue normal  NECK: supple, thyroid normal size, non-tender, without nodularity LYMPH:  no palpable lymphadenopathy in the cervical, axillary or inguinal LUNGS: clear to auscultation and percussion with normal breathing effort HEART: regular rate & rhythm and no murmurs and no lower extremity edema ABDOMEN:abdomen soft, non-tender and normal bowel sounds Musculoskeletal:no cyanosis of digits and no clubbing  NEURO: alert & oriented x 3 with fluent speech, no focal motor/sensory deficits  LABORATORY DATA:  I have reviewed the data as listed    Component Value Date/Time   NA 143 05/06/2021 1344   NA 141 11/06/2016 1102   K 3.7 05/06/2021 1344   K 3.9 11/06/2016 1102   CL 106 05/06/2021 1344   CO2 27 05/06/2021 1344   CO2 27 11/06/2016 1102   GLUCOSE 128 (H) 05/06/2021 1344   GLUCOSE 130 11/06/2016 1102   BUN 25 (H) 05/06/2021 1344   BUN 18.0 11/06/2016 1102   CREATININE 1.39 (H) 05/06/2021 1344   CREATININE 1.34 (H) 03/20/2021 0908   CREATININE 1.42 (H) 02/21/2021 1414   CREATININE 1.3 (H) 11/06/2016 1102   CALCIUM 9.7 05/06/2021 1344   CALCIUM 10.2 11/06/2016 1102   PROT 6.6 05/06/2021 1344   PROT 7.1 11/06/2016 1102   ALBUMIN 3.6 05/06/2021 1344   ALBUMIN 3.7 11/06/2016 1102   AST 30 05/06/2021 1344   AST 35 03/20/2021 0908   AST 52 (H) 11/06/2016 1102   ALT 25 05/06/2021 1344   ALT 25 03/20/2021 0908   ALT 58 (H) 11/06/2016 1102   ALKPHOS 114 05/06/2021 1344   ALKPHOS 159 (H) 11/06/2016 1102   BILITOT 0.5 05/06/2021 1344   BILITOT 0.5 03/20/2021 0908   BILITOT 0.53 11/06/2016 1102   GFRNONAA 38 (L) 05/06/2021 1344   GFRNONAA 40 (L) 03/20/2021 0908   GFRNONAA 34 (L) 02/21/2021 1414   GFRAA 40 (L) 02/21/2021 1414    No results found for: SPEP, UPEP  Lab Results  Component Value Date   WBC 4.7 05/06/2021   NEUTROABS 2.7 05/06/2021   HGB 10.7 (L) 05/06/2021   HCT 32.0 (L) 05/06/2021   MCV 94.4 05/06/2021   PLT 114 (L)  05/06/2021      Chemistry      Component Value Date/Time   NA 143 05/06/2021 1344   NA 141 11/06/2016 1102   K 3.7 05/06/2021 1344   K 3.9 11/06/2016 1102   CL 106 05/06/2021 1344   CO2 27 05/06/2021 1344   CO2 27 11/06/2016 1102   BUN 25 (H) 05/06/2021 1344   BUN 18.0 11/06/2016 1102   CREATININE 1.39 (H) 05/06/2021 1344   CREATININE 1.34 (H) 03/20/2021 0908   CREATININE 1.42 (H) 02/21/2021 1414   CREATININE 1.3 (H) 11/06/2016 1102      Component Value Date/Time   CALCIUM 9.7 05/06/2021 1344   CALCIUM 10.2 11/06/2016 1102   ALKPHOS 114 05/06/2021 1344   ALKPHOS 159 (H) 11/06/2016 1102   AST 30 05/06/2021 1344   AST 35 03/20/2021 0908   AST 52 (H) 11/06/2016 1102   ALT 25 05/06/2021 1344   ALT 25 03/20/2021 0908   ALT 58 (H) 11/06/2016 1102   BILITOT 0.5 05/06/2021 1344   BILITOT 0.5 03/20/2021 0908   BILITOT 0.53 11/06/2016 1102       RADIOGRAPHIC STUDIES: I have reviewed imaging studies extensively with her husband I have personally reviewed the radiological images as listed and agreed with the findings in the report. CT ABDOMEN PELVIS W CONTRAST  Result Date: 05/10/2021 CLINICAL DATA:  Thrombocytopenia. Undergoing treatment for ovarian and breast cancer. Assess treatment response. EXAM: CT ABDOMEN AND PELVIS WITH CONTRAST TECHNIQUE: Multidetector CT imaging of the abdomen and pelvis was performed using the standard protocol following bolus administration of intravenous contrast. CONTRAST:  63m OMNIPAQUE IOHEXOL 300 MG/ML  SOLN COMPARISON:  Abdominopelvic CT 01/29/2021 and 11/24/2020. FINDINGS: Lower chest: Stable mild scarring or atelectasis at both lung bases. No significant pleural or pericardial effusion.  Central venous catheter extends to the superior right atrium. Hepatobiliary: The liver is normal in density without suspicious focal abnormality. No evidence of gallstones, gallbladder wall thickening or biliary dilatation. Pancreas: Unremarkable. No pancreatic  ductal dilatation or surrounding inflammatory changes. Spleen: Normal in size without focal abnormality. Adrenals/Urinary Tract: Both adrenal glands appear normal. Stable low-density renal cysts. No evidence of urinary tract calculus or hydronephrosis. There is peritoneal disease superior to the bladder without definite bladder abnormality. Stomach/Bowel: Enteric contrast was administered and has passed into the mid colon. The stomach appears unremarkable for its degree of distension. No evidence of bowel wall thickening, distention or surrounding inflammatory change. Mild diverticular changes of the distal colon. Vascular/Lymphatic: There are no enlarged abdominal or pelvic lymph nodes. Mild aortic and branch vessel atherosclerosis without aneurysm or acute vascular finding. Reproductive: Hysterectomy. Other: Similar appearance of peritoneal carcinomatosis with loculated complex fluid in the pelvis and associated soft tissue component measuring approximately 3.5 x 3.2 cm on image 73/2. Additional peritoneal nodules are grossly stable, including a 1.1 cm nodule adjacent to the right colon on image 43/2 and a 0.9 cm nodule posterior to the left colon on image 59/2. Musculoskeletal: No acute or significant osseous findings. IMPRESSION: 1. No significant change in peritoneal carcinomatosis since previous study of 3 months ago, primarily in the pelvis where there is complex fluid and peritoneal nodularity. 2. No evidence of solid visceral organ metastasis. No evidence of bowel or ureteral obstruction. 3.  Aortic Atherosclerosis (ICD10-I70.0). Electronically Signed   By: Richardean Sale M.D.   On: 05/10/2021 16:47

## 2021-05-14 NOTE — Assessment & Plan Note (Signed)
I have reviewed CT imaging with her and husband Her recent tumor marker was normal The measurable disease on her CT imaging is smaller She is not symptomatic We discussed the risk and benefits of reducing olaparib further Even though this is not in package insert, we can consider olaparib 100 mg daily instead of twice daily She is in agreement to proceed I will get her to return next week for lab appointment to check her blood count I recommend close monitoring with another surveillance imaging in September

## 2021-05-14 NOTE — Assessment & Plan Note (Signed)
This is improved since we placed her treatment on hold Observe for now We will monitor her labs closely while on reduced dose olaparib

## 2021-05-21 ENCOUNTER — Other Ambulatory Visit: Payer: Self-pay

## 2021-05-21 ENCOUNTER — Telehealth: Payer: Self-pay

## 2021-05-21 ENCOUNTER — Inpatient Hospital Stay: Payer: PPO

## 2021-05-21 DIAGNOSIS — C561 Malignant neoplasm of right ovary: Secondary | ICD-10-CM | POA: Diagnosis not present

## 2021-05-21 DIAGNOSIS — C569 Malignant neoplasm of unspecified ovary: Secondary | ICD-10-CM

## 2021-05-21 DIAGNOSIS — Z170 Estrogen receptor positive status [ER+]: Secondary | ICD-10-CM

## 2021-05-21 DIAGNOSIS — C50212 Malignant neoplasm of upper-inner quadrant of left female breast: Secondary | ICD-10-CM

## 2021-05-21 LAB — CMP (CANCER CENTER ONLY)
ALT: 37 U/L (ref 0–44)
AST: 41 U/L (ref 15–41)
Albumin: 4 g/dL (ref 3.5–5.0)
Alkaline Phosphatase: 113 U/L (ref 38–126)
Anion gap: 7 (ref 5–15)
BUN: 23 mg/dL (ref 8–23)
CO2: 27 mmol/L (ref 22–32)
Calcium: 9.8 mg/dL (ref 8.9–10.3)
Chloride: 106 mmol/L (ref 98–111)
Creatinine: 1.58 mg/dL — ABNORMAL HIGH (ref 0.44–1.00)
GFR, Estimated: 32 mL/min — ABNORMAL LOW (ref >60)
Glucose, Bld: 162 mg/dL — ABNORMAL HIGH (ref 70–99)
Potassium: 3.6 mmol/L (ref 3.5–5.1)
Sodium: 140 mmol/L (ref 135–145)
Total Bilirubin: 0.5 mg/dL (ref 0.3–1.2)
Total Protein: 7 g/dL (ref 6.5–8.1)

## 2021-05-21 LAB — CBC WITH DIFFERENTIAL/PLATELET
Abs Immature Granulocytes: 0.02 10*3/uL (ref 0.00–0.07)
Basophils Absolute: 0 10*3/uL (ref 0.0–0.1)
Basophils Relative: 1 %
Eosinophils Absolute: 0.1 10*3/uL (ref 0.0–0.5)
Eosinophils Relative: 3 %
HCT: 32.7 % — ABNORMAL LOW (ref 36.0–46.0)
Hemoglobin: 11.2 g/dL — ABNORMAL LOW (ref 12.0–15.0)
Immature Granulocytes: 1 %
Lymphocytes Relative: 25 %
Lymphs Abs: 1 10*3/uL (ref 0.7–4.0)
MCH: 31.7 pg (ref 26.0–34.0)
MCHC: 34.3 g/dL (ref 30.0–36.0)
MCV: 92.6 fL (ref 80.0–100.0)
Monocytes Absolute: 0.3 10*3/uL (ref 0.1–1.0)
Monocytes Relative: 8 %
Neutro Abs: 2.6 10*3/uL (ref 1.7–7.7)
Neutrophils Relative %: 62 %
Platelets: 89 10*3/uL — ABNORMAL LOW (ref 150–400)
RBC: 3.53 MIL/uL — ABNORMAL LOW (ref 3.87–5.11)
RDW: 14.6 % (ref 11.5–15.5)
WBC: 4.1 10*3/uL (ref 4.0–10.5)
nRBC: 0 % (ref 0.0–0.2)

## 2021-05-21 NOTE — Telephone Encounter (Signed)
Called and given below message. She verbalized understanding. Appts scheduled and she is aware of appt date/time.

## 2021-05-21 NOTE — Telephone Encounter (Signed)
-----   Message from Heath Lark, MD sent at 05/21/2021 12:50 PM EDT ----- Pls call her, platelets a bit low Tell her to take only on Tuesdays, Thursdays and Saturdays and make changes to her olaparib on her med list Please schedule labs on 6/30, labs and virtual visit

## 2021-05-22 LAB — CA 125: Cancer Antigen (CA) 125: 38.6 U/mL — ABNORMAL HIGH (ref 0.0–38.1)

## 2021-05-24 ENCOUNTER — Other Ambulatory Visit: Payer: Self-pay | Admitting: Internal Medicine

## 2021-05-28 ENCOUNTER — Telehealth: Payer: Self-pay | Admitting: Internal Medicine

## 2021-05-28 NOTE — Chronic Care Management (AMB) (Signed)
  Chronic Care Management   Outreach Note  05/28/2021 Name: Kayla Baker MRN: [id number] DOB: 07-Jan-1936  Referred by: [name], MD Reason for referral : No chief complaint on file.   An unsuccessful telephone outreach was attempted today. The patient was referred to the pharmacist for assistance with care management and care coordination.   Follow Up Plan:   Tatjana Dellinger Upstream Scheduler

## 2021-05-28 NOTE — Progress Notes (Signed)
  Chronic Care Management   Note  05/28/2021 Name: Kayla Baker MRN: [id number] DOB: 18-Jun-1936  [name] is a 85 y.o. year old female who is a primary care patient of [name], MD. I reached out to [place] by phone today in response to a referral sent by [name]'s PCP, [name], MD.   [name] was given information about Chronic Care Management services today including:  CCM service includes personalized support from designated clinical staff supervised by her physician, including individualized plan of care and coordination with other care providers 24/7 contact phone numbers for assistance for urgent and routine care needs. Service will only be billed when office clinical staff spend 20 minutes or more in a month to coordinate care. Only one practitioner may furnish and bill the service in a calendar month. The patient may stop CCM services at any time (effective at the end of the month) by phone call to the office staff.   Patient agreed to services and verbal consent obtained.   Follow up plan:   [name]

## 2021-05-30 ENCOUNTER — Other Ambulatory Visit: Payer: Self-pay

## 2021-05-30 ENCOUNTER — Encounter: Payer: Self-pay | Admitting: Hematology and Oncology

## 2021-05-30 ENCOUNTER — Inpatient Hospital Stay: Payer: PPO | Admitting: Hematology and Oncology

## 2021-05-30 ENCOUNTER — Inpatient Hospital Stay: Payer: PPO

## 2021-05-30 DIAGNOSIS — D61818 Other pancytopenia: Secondary | ICD-10-CM | POA: Diagnosis not present

## 2021-05-30 DIAGNOSIS — C569 Malignant neoplasm of unspecified ovary: Secondary | ICD-10-CM | POA: Diagnosis not present

## 2021-05-30 DIAGNOSIS — N183 Chronic kidney disease, stage 3 unspecified: Secondary | ICD-10-CM | POA: Diagnosis not present

## 2021-05-30 DIAGNOSIS — E1122 Type 2 diabetes mellitus with diabetic chronic kidney disease: Secondary | ICD-10-CM

## 2021-05-30 DIAGNOSIS — Z170 Estrogen receptor positive status [ER+]: Secondary | ICD-10-CM

## 2021-05-30 DIAGNOSIS — C561 Malignant neoplasm of right ovary: Secondary | ICD-10-CM | POA: Diagnosis not present

## 2021-05-30 LAB — COMPREHENSIVE METABOLIC PANEL
ALT: 25 U/L (ref 0–44)
AST: 40 U/L (ref 15–41)
Albumin: 3.5 g/dL (ref 3.5–5.0)
Alkaline Phosphatase: 114 U/L (ref 38–126)
Anion gap: 8 (ref 5–15)
BUN: 28 mg/dL — ABNORMAL HIGH (ref 8–23)
CO2: 27 mmol/L (ref 22–32)
Calcium: 9.6 mg/dL (ref 8.9–10.3)
Chloride: 106 mmol/L (ref 98–111)
Creatinine, Ser: 1.62 mg/dL — ABNORMAL HIGH (ref 0.44–1.00)
GFR, Estimated: 31 mL/min — ABNORMAL LOW (ref >60)
Glucose, Bld: 183 mg/dL — ABNORMAL HIGH (ref 70–99)
Potassium: 3.7 mmol/L (ref 3.5–5.1)
Sodium: 141 mmol/L (ref 135–145)
Total Bilirubin: 0.5 mg/dL (ref 0.3–1.2)
Total Protein: 6.6 g/dL (ref 6.5–8.1)

## 2021-05-30 LAB — CBC WITH DIFFERENTIAL/PLATELET
Abs Immature Granulocytes: 0.01 10*3/uL (ref 0.00–0.07)
Basophils Absolute: 0 10*3/uL (ref 0.0–0.1)
Basophils Relative: 1 %
Eosinophils Absolute: 0.1 10*3/uL (ref 0.0–0.5)
Eosinophils Relative: 3 %
HCT: 32.5 % — ABNORMAL LOW (ref 36.0–46.0)
Hemoglobin: 10.9 g/dL — ABNORMAL LOW (ref 12.0–15.0)
Immature Granulocytes: 0 %
Lymphocytes Relative: 27 %
Lymphs Abs: 1.1 10*3/uL (ref 0.7–4.0)
MCH: 31.4 pg (ref 26.0–34.0)
MCHC: 33.5 g/dL (ref 30.0–36.0)
MCV: 93.7 fL (ref 80.0–100.0)
Monocytes Absolute: 0.4 10*3/uL (ref 0.1–1.0)
Monocytes Relative: 10 %
Neutro Abs: 2.4 10*3/uL (ref 1.7–7.7)
Neutrophils Relative %: 59 %
Platelets: 106 10*3/uL — ABNORMAL LOW (ref 150–400)
RBC: 3.47 MIL/uL — ABNORMAL LOW (ref 3.87–5.11)
RDW: 14.7 % (ref 11.5–15.5)
WBC: 4.1 10*3/uL (ref 4.0–10.5)
nRBC: 0 % (ref 0.0–0.2)

## 2021-05-30 IMAGING — US US PARACENTESIS
1 series · 14 of 15 positions shown · non-contrast
Comparison: none

INDICATION: Patient with history of metastatic ovarian cancer with recurrent
malignant ascites. Request received for therapeutic paracentesis.

[Series 1: us paracentesis · 14 of 15 slices shown]
[im 1/15]
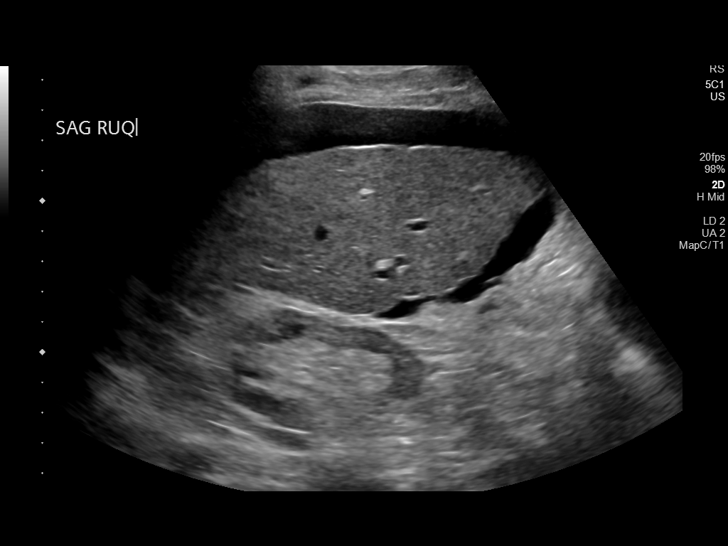
[im 2/15]
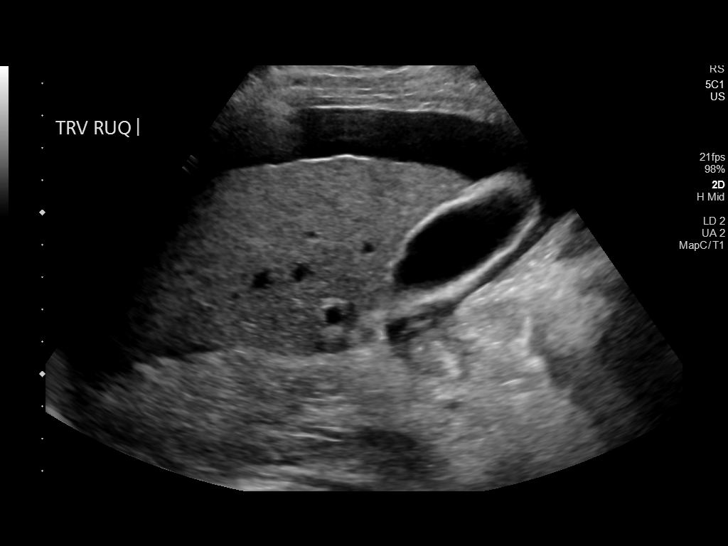
[im 3/15]
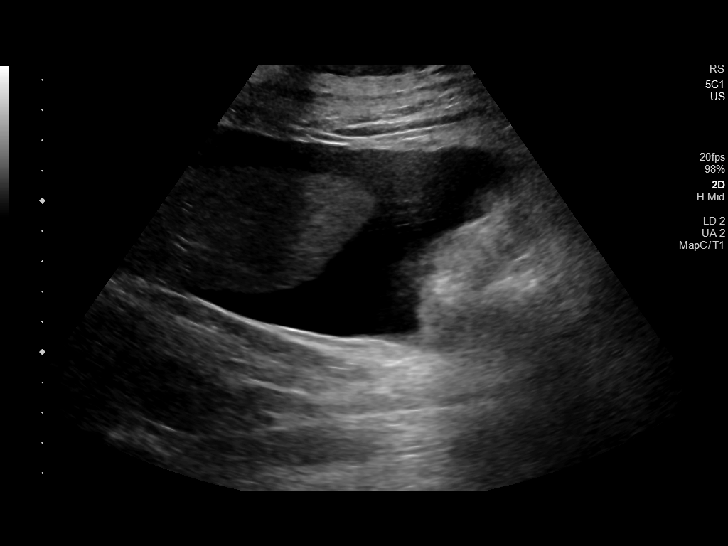
[im 4/15]
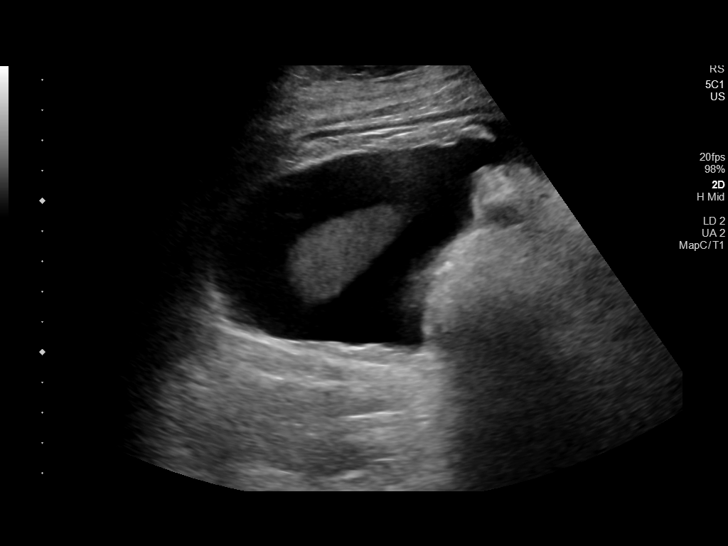
[im 5/15]
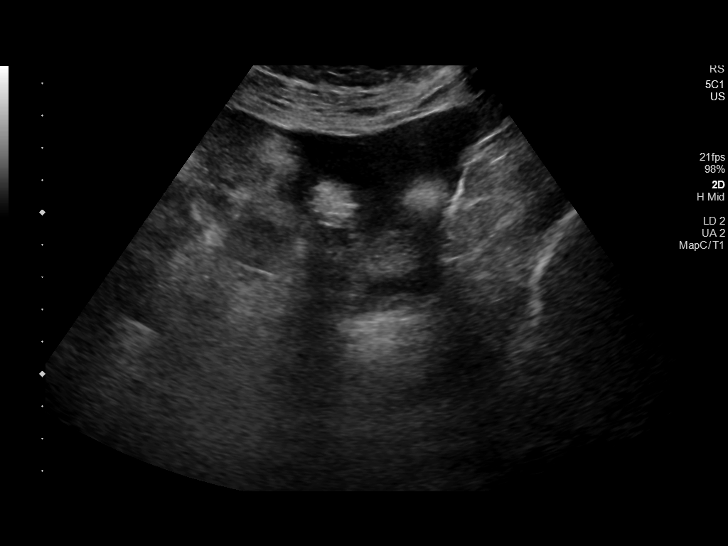
[im 6/15]
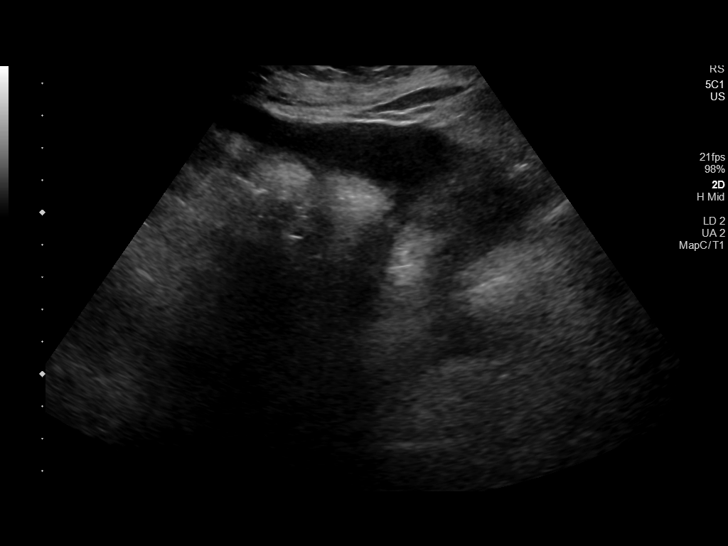
[im 7/15]
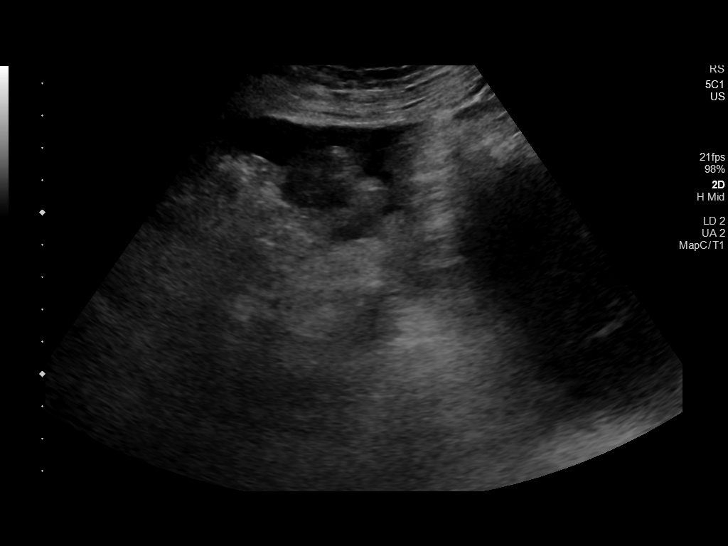
[im 9/15]
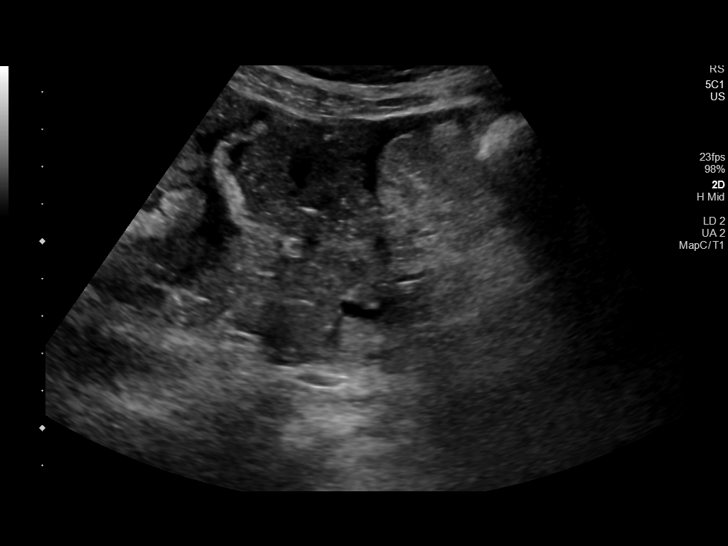
[im 10/15]
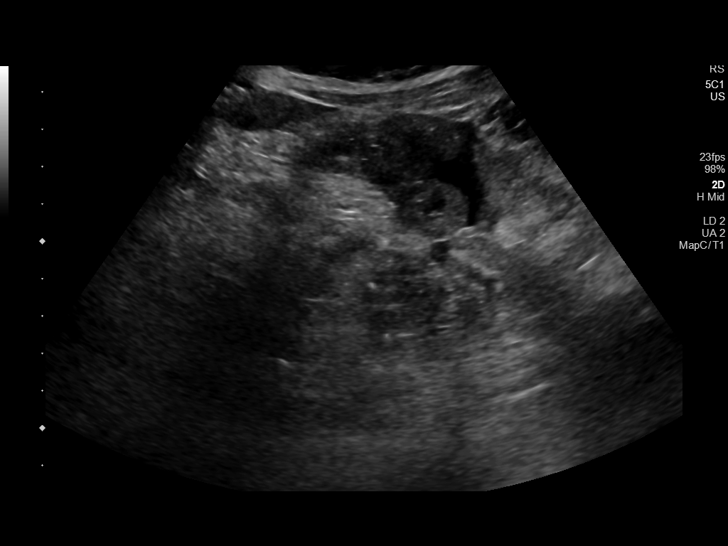
[im 11/15]
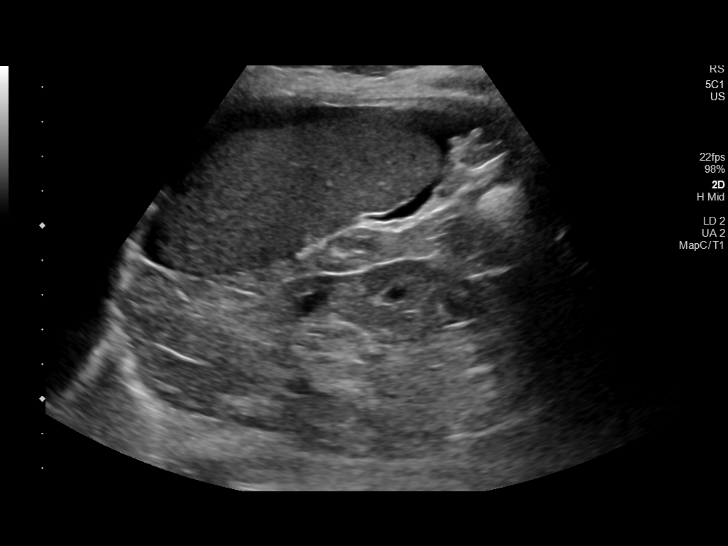
[im 12/15]
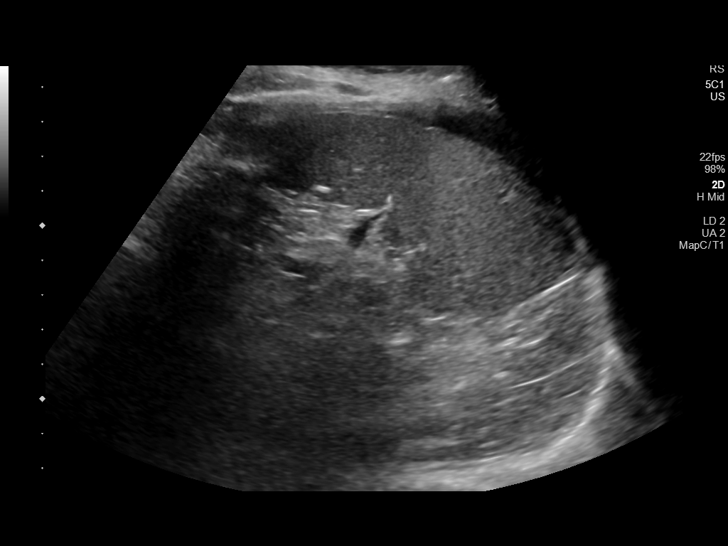
[im 13/15]
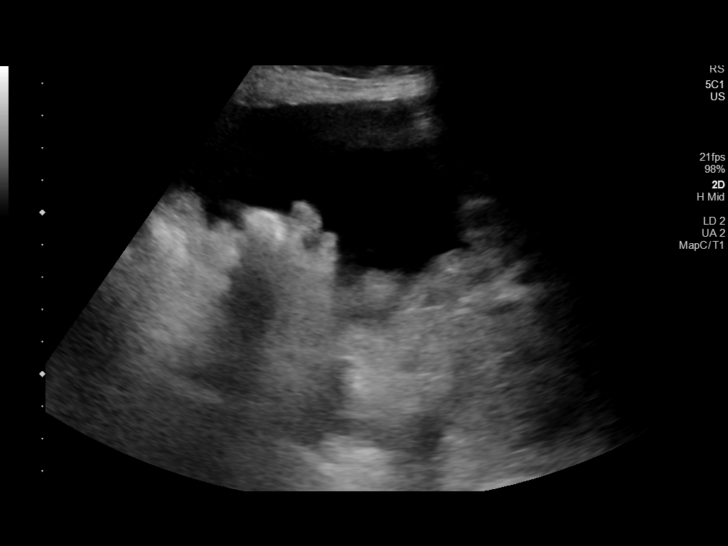
[im 14/15]
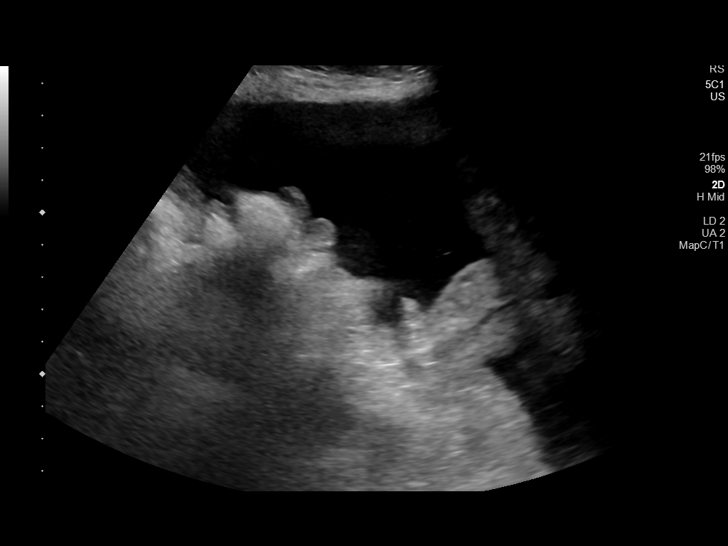
[im 15/15]
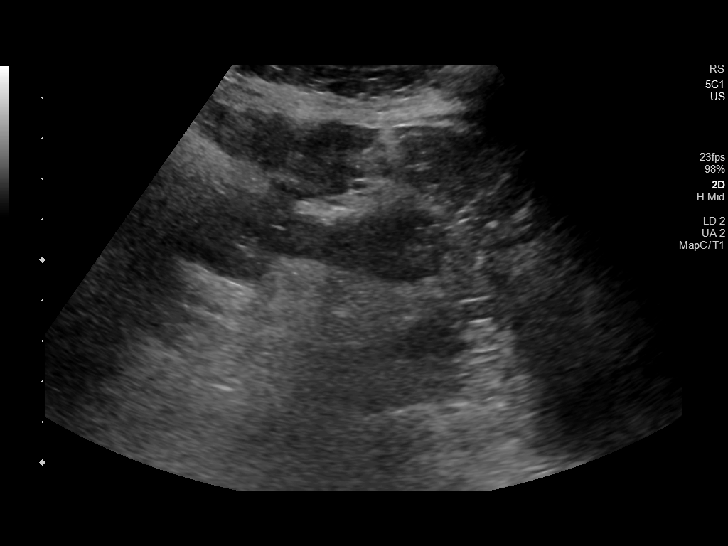

[14 of 15 positions shown; findings below may reference images not displayed]

EXAM:
ULTRASOUND GUIDED THERAPEUTIC PARACENTESIS

MEDICATIONS:
None

COMPLICATIONS:
None immediate.

PROCEDURE:
Informed written consent was obtained from the patient after a
discussion of the risks, benefits and alternatives to treatment. A
timeout was performed prior to the initiation of the procedure.

Initial ultrasound scanning demonstrates a small amount of ascites
within the left lower abdominal quadrant. The left lower abdomen was
prepped and draped in the usual sterile fashion. 1% lidocaine was
used for local anesthesia.

Following this, a 6 Fr Safe-T-Centesis catheter was introduced. An
ultrasound image was saved for documentation purposes. The
paracentesis was performed. The catheter was removed and a dressing
was applied. The patient tolerated the procedure well without
immediate post procedural complication.
FINDINGS: A total of approximately 1.7 liters of blood-tinged fluid was
removed.
IMPRESSION: Successful ultrasound-guided therapeutic paracentesis yielding
liters of peritoneal fluid.

## 2021-05-30 MED ORDER — OLAPARIB 100 MG PO TABS: 100.0000 mg | ORAL_TABLET | ORAL | Status: DC

## 2021-05-30 NOTE — Assessment & Plan Note (Signed)
So far, she tolerated olaparib 100 mg on Tuesdays, Thursdays and Saturdays She have no new side effects I recommend changing her olaparib treatment to 100 mg on Mondays, Wednesdays, Fridays and Sundays and avoid taking it on Tuesdays, Thursdays and Saturdays With this regimen, she will be taking 400 mg weekly total She will return again in 2 weeks for further follow-up and repeat blood work

## 2021-05-30 NOTE — Progress Notes (Signed)
HEMATOLOGY-ONCOLOGY ELECTRONIC VISIT PROGRESS NOTE  Patient Care Team: [name], MD as PCP - Kinnie Scales, [place] as Referring Physician (Optometry) [name], MD as Consulting Physician (Dermatology) Latanya Maudlin, MD as Consulting Physician (Orthopedic Surgery) Inda Castle, MD (Inactive) as Consulting Physician (Gastroenterology) Jacqulyn Liner, RN as Oncology Nurse Navigator (Oncology) Heath Lark, MD as Consulting Physician (Hematology and Oncology) Madelon Lips, MD as Consulting Physician (Nephrology) Rush Landmark, [facility] as Pharmacist (Pharmacist)  I connected with the patient via telephone call for virtual visit today  ASSESSMENT & PLAN:  Ovarian cancer [facility]) So far, she tolerated olaparib 100 mg on Tuesdays, Thursdays and Saturdays She have no new side effects I recommend changing her olaparib treatment to 100 mg on Mondays, Wednesdays, Fridays and Sundays and avoid taking it on Tuesdays, Thursdays and Saturdays With this regimen, she will be taking 400 mg weekly total She will return again in 2 weeks for further follow-up and repeat blood work  CKD stage 3 due to type 2 diabetes mellitus ([place]) Her blood sugar is high and is associated with slightly elevated creatinine Reminded her to increase oral fluid intake as tolerated and to follow-up with primary care doctor for medication adjustment  Pancytopenia, acquired ([place]) As above, she tolerated elaborate at reduced dose better We will continue to monitor closely and she will proceed with dose changes as outlined above  No orders of the defined types were placed in this encounter.   INTERVAL HISTORY: Please see below for problem oriented charting. She feels fine She did not recall eating anything this morning prior to her blood draw She denies side effects from treatment The patient denies any recent signs or symptoms of bleeding such as spontaneous epistaxis, hematuria or hematochezia. Energy level  is fair  SUMMARY OF ONCOLOGIC HISTORY: Oncology History Overview Note  HRD positive   Breast cancer of upper-inner quadrant of left female breast ([place])  02/26/2016 Initial Diagnosis   Left breast biopsy 11:00 position: invasive ductal carcinoma with DCIS, ER 90%, PR 10%, HER-2 negative, Ki-67 30%, grade 2, 2.2 cm palpable lesion T2 N0 stage II a clinical stage    03/18/2016 Surgery   Left lumpectomy: Invasive ductal carcinoma, grade 2, 6.3 cm, with high-grade DCIS, margins negative, 0/4 lymph nodes negative, ER 90%, via 10%, HER-2 negative ratio 0.97, Ki-67 30%, T3 N0 stage IIB    03/25/2016 Procedure   Genetic testing is negative for pathogenic mutations within any of the 20 Genes on the breast/ovarian cancer panel    04/04/2016 Oncotype testing   Oncotype DX recurrence score 37, 25% 10 year distant risk of recurrence    04/17/2016 - 07/31/2016 Chemotherapy   Adjuvant chemotherapy with dose dense Adriamycin and Cytoxan followed by Abraxane weekly 8 ( discontinued due to neuropathy)   09/01/2016 - 09/26/2016 Radiation Therapy   Adj XRT 1) Left breast: 42.5 Gy in 17 fractions. 2) Left breast boost: 7.5 Gy in 3 fractions.    12/02/2016 -  Anti-estrogen oral therapy   Anastrozole 1 mg daily    Ovarian cancer ([place])  04/03/2020 Imaging   US pelvis Complex cystic and solid mass in LEFT adnexa 12.5 cm diameter question cystic ovarian neoplasm; recommend correlation with serum tumor markers and further evaluation by MR imaging with and without contrast.     04/04/2020 Imaging   US venous Doppler No evidence of deep venous thrombosis in the right lower extremity. Left common femoral vein also patent.   04/15/2020 Imaging   MRI pelvis 1.  Large complex solid and cystic mass arising from the right adnexa measuring 10.8 by 11.5 by 11.1 cm. This has an aggressive appearance with extensive enhancing mural soft tissue components. Findings are highly suspicious for malignant ovarian neoplasm. 2.  Extensive bilateral retroperitoneal and bilateral iliac adenopathy compatible with metastatic disease. 3. Signs of extensive peritoneal carcinomatosis including ascites, enhancement, thickening and nodularity of the peritoneal reflections, omental caking and bulky peritoneal nodularity. 4. Suspected serosal involvement of the dome of bladder with loss of normal fat plane. Cannot rule out mural invasion by tumor.   04/17/2020 Tumor Marker   Patient's tumor was tested for the following markers: CA-125 Results of the tumor marker test revealed 1762.   04/20/2020 Cancer Staging   Staging form: Ovary, Fallopian Tube, and Primary Peritoneal Carcinoma, AJCC 8th Edition - Clinical: FIGO Stage IIIC (cT3, cN1, cM0) - Signed by Heath Lark, MD on 04/20/2020    04/23/2020 Imaging   1. Redemonstrated dominant mixed solid and cystic mass arising from the vicinity of the right ovary measuring at least 11.5 x 10.7 cm, not significantly changed compared to prior MR, and consistent with primary ovarian malignancy.  2. Numerous bulky retroperitoneal, bilateral iliac, and pelvic sidewall lymph nodes. 3. Moderate volume ascites throughout the abdomen and pelvis with subtle thickening and nodularity throughout the peritoneum, and extensive bulky nodular metastatic disease of the omentum. 4. Constellation of findings is consistent with advanced nodal and peritoneal metastatic disease. 5. There are prominent subcentimeter epicardial lymph nodes, nonspecific although suspicious for nodal metastatic disease. No definite nodal metastatic disease in the chest. Attention on follow-up. 6. There are multiple small subpleural nodules at the right lung base overlying the diaphragm, measuring up to 7 mm. These are generally nonspecific and less favored to represent pulmonary metastatic disease given distribution. Attention on follow-up. 7. Somewhat coarse contour of the liver, suggestive of cirrhosis, although out without overt  morphologic stigmata. 8. Aortic Atherosclerosis (ICD10-I70.0).     04/24/2020 Procedure   Successful placement of a right internal jugular approach power injectable Port-A-Cath. The catheter is ready for immediate use.     05/02/2020 Tumor Marker   Patient's tumor was tested for the following markers: CA-125 Results of the tumor marker test revealed 1921   05/03/2020 - 12/12/2020 Chemotherapy   The patient had carboplatin and taxol for chemotherapy treatment.     05/08/2020 Procedure   Successful ultrasound-guided paracentesis yielding 2.6 liters of peritoneal fluid.   05/16/2020 Procedure   Successful ultrasound-guided paracentesis yielding 3.2 liters of peritoneal fluid   05/25/2020 Procedure   Successful ultrasound-guided paracentesis yielding 3 liters of peritoneal fluid.     06/01/2020 Procedure   Successful ultrasound-guided therapeutic paracentesis yielding 1.7 liters of peritoneal fluid.   06/14/2020 Tumor Marker   Patient's tumor was tested for the following markers: CA-125 Results of the tumor marker test revealed 1309   06/20/2020 Imaging   1. Dominant mixed cystic and solid mass in the central pelvis is stable in the interval. 2. Clear interval decrease in retroperitoneal and pelvic sidewall lymphadenopathy. The bulky omental disease has also clearly decreased in the interval. 3. Interval decrease in ascites. 4. Stable 8 mm subpleural nodule along the diaphragm. 5. Aortic Atherosclerosis (ICD10-I70.0).   07/10/2020 Tumor Marker   Patient's tumor was tested for the following markers: CA-125 Results of the tumor marker test revealed 220   08/03/2020 Tumor Marker   Patient's tumor was tested for the following markers: CA-125 Results of the tumor marker test revealed 53.3.  09/03/2020 Tumor Marker   Patient's tumor was tested for the following markers: CA-125 Results of the tumor marker test revealed 29.7   09/20/2020 Imaging   1. Substantial improvement. The right ovarian  cystic and solid mass is half the volume that it measured on 06/20/2020. Similar reduction in the bulk of the omental caking of tumor. Prior ascites is resolved and the retroperitoneal adenopathy is markedly improved. 2. Other imaging findings of potential clinical significance: Stable pleural-based nodularity along the right hemidiaphragm measuring about 1.1 by 0.7 by 0.3 cm. Stable hypodense lesion of the right kidney upper pole, probably a cyst although the configuration of this lesion makes it difficult to obtain accurate density measurements. Left ovarian cyst, stable. Chondrocalcinosis involving the acetabular labra. Mild lumbar spondylosis and degenerative disc disease. 3. Aortic atherosclerosis.     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 (stage ypT3c), predominantly viable with focal fibrosis and hemosiderin laden macrophages (possible mild treatment effect)  B: Ovary and fallopian tube, right, salpingo-oophorectomy - High grade serous carcinoma involving right ovary and fallopian tube with surface involvement, size up to 8 cm  - Areas of necrosis and hemosiderin laden macrophages suggestive of treatment effect - 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 - See synoptic report and comment  C: Ovary and fallopian tube, left, salpingo-oophorectomy - High grade serous carcinoma involving the left fallopian tube, size up to 0.5 cm - Ovary with no definite involvement by carcinoma identified - Nodular area of endometriosis and peritoneal inclusion cyst also present - See synoptic report and comment   Procedure:    Bilateral salpingo-oophorectomy    Procedure:    Omentectomy    Specimen Integrity of Right Ovary:     Intact with surface involvement by tumor    Specimen Integrity of Left Ovary:    Capsule intact   TUMOR Tumor Site:    Right fallopian tube: favor as primary site; also involves right ovary and left fallopian tube  Histologic Type:    Serous carcinoma  Histologic Grade:    High grade  Tumor Size:    Greatest Dimension (Centimeters): in fallopian tube: 1.0 cm; in ovary: 8.0 cm Ovarian Surface Involvement:    Present    Laterality:    Right  Fallopian Tube Surface Involvement:    Present    Laterality:    Bilateral  Other Tissue / Organ Involvement:    Right ovary  Other Tissue / Organ Involvement:    Right fallopian tube  Other Tissue / Organ Involvement:    Left fallopian tube  Other Tissue / Organ Involvement:    Omentum  Largest Extrapelvic Peritoneal Focus:    Macroscopic (greater than 2 cm)  Peritoneal / Ascitic Fluid:    Not submitted / unknown  Pleural Fluid:    Not submitted / unknown  Treatment Effect:    No definite or minimal response identified (chemotherapy response score [CRS] 1)   LYMPH NODES Regional Lymph Nodes:    No lymph nodes submitted or found   PATHOLOGIC STAGE CLASSIFICATION (pTNM, AJCC 8th Edition) TNM Descriptors:    y (post-treatment)  Primary Tumor (pT):    pT3c  Regional Lymph Nodes (pN):    pNX   FIGO STAGE FIGO Stage:    IIIC   ADDITIONAL FINDINGS Additional Findings:  Serous tubal intraepithelial carcinoma (STIC)  Additional Findings:    Left ovary with no definite carcinoma identified, left-sided nodule of endometriosis and peritoneal inclusion cyst    10/18/2020 - 10/20/2020 Hospital Admission   She was admitted to [facility] for interval debulking surgery   11/19/2020 Tumor Marker   Patient's tumor was tested for the following markers: CA-125. Results of the tumor marker test revealed 38.3   12/12/2020 Tumor Marker   Patient's tumor was tested for the following markers CA-125. Results of the tumor marker test revealed 42.3.    01/02/2021 Tumor Marker   Patient's tumor was tested for the following markers: CA-125. Results of the tumor marker test revealed 37.3   01/29/2021 Tumor Marker   Patient's tumor was tested for the following markers: CA-125 Results of the tumor marker test revealed 44.3   01/29/2021 Imaging   1. Interval increase in loculated appearing ascites in the low central abdomen and pelvis. 2. There is extensive peritoneal nodularity surrounding this fluid, the nodularity itself not appreciably changed in appearance compared to prior examination. 3. Additional peritoneal nodularity and/or mesenteric lymph nodes are unchanged. 4. Interval decrease in size of a low-attenuation nodule overlying the right hemidiaphragm, now measuring 1.2 cm, previously 1.8 cm when measured similarly. Findings are consistent with treatment response of a metastatic nodule. No other evidence of intrathoracic metastatic disease. 5. Status post hysterectomy, oophorectomy, and omentectomy.   01/31/2021 -  Chemotherapy   She is started on Olaparib       03/04/2021 Tumor Marker   Patient's tumor was tested for the following markers: CA-125 Results of the tumor marker test revealed 39.7   04/01/2021 Tumor Marker   Patient's tumor was tested for the following markers: CA-125 Results of the tumor marker test revealed 32.3   05/10/2021 Imaging   1. No significant change in peritoneal carcinomatosis since previous study of 3 months ago, primarily in the pelvis where there is complex fluid and peritoneal nodularity. 2. No evidence of solid visceral organ metastasis. No evidence of bowel or ureteral obstruction. 3.  Aortic Atherosclerosis (ICD10-I70.0).     REVIEW OF SYSTEMS:   Constitutional: Denies fevers, chills or abnormal weight loss Eyes: Denies blurriness of vision Ears, nose, mouth, throat, and face: Denies mucositis or sore throat Respiratory: Denies cough, dyspnea or wheezes Cardiovascular: Denies palpitation, chest  discomfort Gastrointestinal:  Denies nausea, heartburn or change in bowel habits Skin: Denies abnormal skin rashes Lymphatics: Denies new lymphadenopathy or easy bruising Neurological:Denies numbness, tingling or new weaknesses Behavioral/Psych: Mood is stable, no new changes  Extremities: No lower extremity edema All other systems were reviewed with the patient and are negative.  I have reviewed the past medical history, past surgical history, social history and family history with the patient and they are unchanged from previous note.  ALLERGIES:  is allergic to keflex [cephalexin], meloxicam, prednisone, premarin [estrogens conjugated], and trovan [alatrofloxacin].  MEDICATIONS:  Current Outpatient Medications  Medication Sig Dispense Refill   acetaminophen (TYLENOL) 325 MG tablet Take 325 mg by mouth every 6 (six) hours as needed for headache.     allopurinol (ZYLOPRIM) 300 MG tablet TAKE 1 TABLET BY MOUTH DAILY FOR GOUT PREVENTION 90 tablet 1   blood glucose meter kit and supplies KIT Dispense based on patient and insurance preference. Use to check blood sugar once daily. Dx: E11.22, N18.30 1 each 12   Cholecalciferol (VITAMIN D3) 5000 units CAPS Take 5,000 Units by mouth daily.     cyanocobalamin 1000 MCG tablet  Take 1,000 mcg by mouth daily.     ezetimibe (ZETIA) 10 MG tablet Patient knows to take by mouth  !   - Thanks 90 tablet 3   gabapentin (NEURONTIN) 300 MG capsule Take 1 capsule in the morning and afternoon and 2 capsules at night 360 capsule 3   glimepiride (AMARYL) 1 MG tablet Take 1 tab by mouth in the morning if fasting is running 150+. Please do not take this medication if you are ill or not eating as this can cause a low blood sugar. 90 tablet 1   glucose blood (FREESTYLE TEST STRIPS) test strip Check Blood Sugar Daily as directed (Dx: E11.29) 100 strip 3   hydrochlorothiazide (HYDRODIURIL) 25 MG tablet TAKE ONE TABLET BY MOUTH DAILY FOR FOR BLOOD PRESSURE AND FLUID  RETENTION/ ANKLE SWELLING 90 tablet 1   ketoconazole (NIZORAL) 2 % cream Apply 1 application topically 2 (two) times daily. To groin rash 60 g 0   Lancets (FREESTYLE) lancets CHECK BLOOD SUGAR DAILY AS DIRECTED 100 each 3   lidocaine-prilocaine (EMLA) cream Apply to affected area once (Patient taking differently: Apply 1 application topically daily as needed (port access).) 30 g 3   loratadine (CLARITIN) 10 MG tablet Take 10 mg by mouth daily as needed for allergies.     Magnesium 250 MG TABS Take 250 mg by mouth every other day.     metoprolol succinate (TOPROL-XL) 25 MG 24 hr tablet TAKE 1 TABLET BY MOUTH DAILY FOR BLOOD PRESSURE (Patient taking differently: Take 25 mg by mouth daily.) 90 tablet 3   [START ON 05/31/2021] olaparib (LYNPARZA) 100 MG tablet Take 1 tablet (100 mg total) by mouth 3 (three) times a week. Take daily except on Tuesdays, Thursdays and Saturdays.     omeprazole (PRILOSEC) 20 MG capsule Take 1 capsule (20 mg total) by mouth daily. 30 capsule 11   ondansetron (ZOFRAN) 8 MG tablet Take 1 tablet (8 mg total) by mouth 2 (two) times daily as needed for refractory nausea / vomiting. Start on day 3 after carboplatin chemo. 30 tablet 1   prochlorperazine (COMPAZINE) 10 MG tablet Take 1 tablet (10 mg total) by mouth every 6 (six) hours as needed for nausea or vomiting. 60 tablet 9   rosuvastatin (CRESTOR) 5 MG tablet Take 1 tablet (5 mg total) by mouth 3 (three) times a week. In the evening for cholesterol. 36 tablet 3   vitamin C (ASCORBIC ACID) 500 MG tablet Take 500 mg by mouth every evening.      No current facility-administered medications for this visit.    PHYSICAL EXAMINATION: ECOG PERFORMANCE STATUS: 1 - Symptomatic but completely ambulatory  LABORATORY DATA:  I have reviewed the data as listed CMP Latest Ref Rng & Units 05/30/2021 05/21/2021 05/06/2021  Glucose 70 - 99 mg/dL 183(H) 162(H) 128(H)  BUN 8 - 23 mg/dL 28(H) 23 25(H)  Creatinine 0.44 - 1.00 mg/dL 1.62(H)  1.58(H) 1.39(H)  Sodium 135 - 145 mmol/L 141 140 143  Potassium 3.5 - 5.1 mmol/L 3.7 3.6 3.7  Chloride 98 - 111 mmol/L 106 106 106  CO2 22 - 32 mmol/L 27 27 27   Calcium 8.9 - 10.3 mg/dL 9.6 9.8 9.7  Total Protein 6.5 - 8.1 g/dL 6.6 7.0 6.6  Total Bilirubin 0.3 - 1.2 mg/dL 0.5 0.5 0.5  Alkaline Phos 38 - 126 U/L 114 113 114  AST 15 - 41 U/L 40 41 30  ALT 0 - 44 U/L 25 37 25    Lab  Results  Component Value Date   WBC 4.1 05/30/2021   HGB 10.9 (L) 05/30/2021   HCT 32.5 (L) 05/30/2021   MCV 93.7 05/30/2021   PLT 106 (L) 05/30/2021   NEUTROABS 2.4 05/30/2021    I discussed the assessment and treatment plan with the patient. The patient was provided an opportunity to ask questions and all were answered. The patient agreed with the plan and demonstrated an understanding of the instructions. The patient was advised to call back or seek an in-person evaluation if the symptoms worsen or if the condition fails to improve as anticipated.    I spent 20 minutes for the appointment reviewing test results, discuss management and coordination of care.  Heath Lark, MD 05/30/2021 1:47 PM

## 2021-05-30 NOTE — Assessment & Plan Note (Signed)
As above, she tolerated elaborate at reduced dose better We will continue to monitor closely and she will proceed with dose changes as outlined above

## 2021-05-30 NOTE — Assessment & Plan Note (Signed)
Her blood sugar is high and is associated with slightly elevated creatinine Reminded her to increase oral fluid intake as tolerated and to follow-up with primary care doctor for medication adjustment

## 2021-05-31 ENCOUNTER — Encounter: Payer: Self-pay | Admitting: Hematology and Oncology

## 2021-06-10 DIAGNOSIS — I700 Atherosclerosis of aorta: Secondary | ICD-10-CM | POA: Insufficient documentation

## 2021-06-10 NOTE — Progress Notes (Signed)
Future Appointments  Date Time Provider [place]  06/11/2021  9:30 AM [name], MD [facility] None  06/13/2021  1:20 PM Heath Lark, MD [facility] None  07/09/2021 11:30 AM Rush Landmark, RPH [facility] None  09/19/2021 10:30 AM Liane Comber, NP [facility] None  02/21/2022 10:00 AM Liane Comber, NP [facility] None    History of Present Illness:       This very nice 85 y.o. WWF  presents for 3 month follow up with HTN, HLD, T2_DM w/CKD3b and Vitamin D Deficiency.  Aortic Atherosclerosis by Abd CTscan on 05/10/2021. Patient is being followed closely by Dr [name] Yvonna Alanis for hx of Ca of Breast (2017) and is on active ChemoTx for metastatic Ovarian Cancer (Nov 2021) . Patient also has concerns re: painful ingrown Rt 1st toenail & requests podiatry referral.        Patient is treated for HTN (2000) & BP has been controlled at home. Today's B is at goal -  128/80. Patient has had no complaints of any cardiac type chest pain, palpitations, dyspnea / orthopnea / PND, dizziness, claudication, or dependent edema.       Hyperlipidemia is controlled with diet & meds. Patient denies myalgias or other med SE's. Last Lipids were  Lab Results  Component Value Date   CHOL 197 02/21/2021   HDL 39 (L) 02/21/2021   LDLCALC 121 (H) 02/21/2021   TRIG 258 (H) 02/21/2021   CHOLHDL 5.1 (H) 02/21/2021     Also, the patient has history of T2_NIDDM (2010) w/CKD 3b (GFR 31)  and also painful sensory Neuropathy. Patient has had no symptoms of reactive hypoglycemia, diabetic polys or visual blurring.  Last A1c was not at goal:  Lab Results  Component Value Date   HGBA1C 6.5 (H) 02/21/2021                                                        Further, the patient also has history of Vitamin D Deficiency ("33" /2008) and supplements vitamin D without any suspected side-effects. Last vitamin D was at goal:  Lab Results  Component Value Date   VD25OH 11 02/21/2021     Current  Outpatient Medications on File Prior to Visit  Medication Sig   acetaminophen (TYLENOL) 325 MG tablet Take 325 mg  every 6 (six) hours as needed for headache.   allopurinol (ZYLOPRIM) 300 MG tablet TAKE 1 TABLET DAILY FOR GOUT PREVENTION   Cholecalciferol (VITAMIN D3) 5000 units CAPS Take  daily.   cyanocobalamin 1000 MCG tablet Take daily.   ezetimibe (ZETIA) 10 MG tablet Patient knows to take by mouth  !     gabapentin (NEURONTIN) 300 MG capsule Take 1 capsule in the morning and afternoon and 2 capsules at night   glimepiride (AMARYL) 1 MG tablet Take 1 tab in the morning    hydrochlorothiazide 25 MG tablet TAKE ONE TABLET DAILY    lidocaine-prilocaine (EMLA) cream Apply 1 application daily as needed (port access)   loratadine (CLARITIN) 10 MG tablet Take 10 mg daily as needed for allergies.   Magnesium 250 MG TABS Take every other day.   metoprolol succinate-XL) 25 MG 24 hr tablet TAKE 1 TABLET DAILY    olaparib (LYNPARZA) 100 MG tablet Take 1 tablet  3 times a week   omeprazole (PRILOSEC) 20  MG capsule Take 1 capsule  daily.   ondansetron (ZOFRAN) 8 MG tablet Take 1 tablet 2 x daily as needed for refractory nausea /vomiting.   rosuvastatin (CRESTOR) 5 MG tablet Take 1 tablet 3  times a week.   vitamin C (ASCORBIC ACID) 500 MG tablet Take 500 mg every evening.     Allergies  Allergen Reactions   Keflex [Cephalexin] Swelling    Tongue and throat swells   Meloxicam Swelling   Prednisone Swelling     Can Not take High doses   Premarin [Estrogens Conjugated] Hives   Trovan [Alatrofloxacin] Unknown reaction    PMHx:   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[place]) 02/29/2016   skin- 2016- squamous- on right upper arm   GERD (gastroesophageal reflux disease)    Gout    takes Allopurinol daily   History of chemotherapy    History of colon  polyps    benign   History of shingles    Hx of radiation therapy    Hyperlipidemia    Hypertension    Ovarian ca ([place]) dx'd 03/2020   Personal history of chemotherapy    2017   Personal history of radiation therapy    2017   Vitamin D deficiency      Immunization History  Administered Date(s) Administered   DTaP 07/01/2004   Influenza Whole 08/15/2013   Influenza, High Dose Seasonal PF 08/28/2014, 09/20/2015, 08/19/2017, 09/01/2018, 07/28/2019   Influenza-Unspecified 08/04/2020   PFIZER(Purple Top)SARS-COV-2 Vaccination 12/12/2019, 12/31/2019, 08/03/2020   Pneumococcal Conjugate-13 09/20/2015   Pneumococcal Polysaccharide-23 11/01/2009   Td 08/10/2009, 04/11/2020   Zoster, Live 08/02/2011     Past Surgical History:  Procedure Laterality Date   ABDOMINAL HYSTERECTOMY  1973   BREAST BIOPSY Left 02/26/2016   BREAST LUMPECTOMY Left 03/18/2016   CARDIAC CATHETERIZATION     patient denies this procedure   CATARACT EXTRACTION, BILATERAL  2014   right eye 2/14; left eye 3/3   COLONOSCOPY     HAND SURGERY Left 2012   INCONTINENCE SURGERY  2006   IR IMAGING GUIDED PORT INSERTION  04/24/2020   IR PARACENTESIS  05/08/2020   IR PARACENTESIS  05/16/2020   IR PARACENTESIS  05/25/2020   KNEE ARTHROSCOPY Right    MASTOPEXY Right 06/16/2017   Procedure: RIGHT BREAST MASTOPEXY;  Surgeon: Irene Limbo, MD;  Location: [facility];  Service: Plastics;  Laterality: Right;   PORT-A-CATH REMOVAL N/A 11/27/2016   Procedure: REMOVAL PORT-A-CATH;  Surgeon: Fanny Skates, MD;  Location: [facility];  Service: General;  Laterality: N/A;   PORTACATH PLACEMENT N/A 04/14/2016   Procedure: INSERTION PORT-A-CATH ;  Surgeon: Fanny Skates, MD;  Location: [facility];  Service: General;  Laterality: N/A;   RADIOACTIVE SEED GUIDED PARTIAL MASTECTOMY WITH AXILLARY SENTINEL LYMPH NODE BIOPSY Left 03/18/2016   Procedure: RADIOACTIVE SEED GUIDED LEFT PARTIAL MASTECTOMY WITH AXILLARY SENTINEL LYMPH NODE  BIOPSY AND BLUE DYE INJECTION;  Surgeon: Fanny Skates, MD;  Location: [facility];  Service: General;  Laterality: Left;   REDUCTION MAMMAPLASTY Right 2018    FHx:    Reviewed / unchanged  SHx:    Reviewed / unchanged   Systems Review:  Constitutional: Denies fever, chills, wt changes, headaches, insomnia, fatigue, night sweats, change in appetite. Eyes: Denies redness, blurred vision, diplopia, discharge, itchy, watery eyes.  ENT: Denies discharge, congestion, post nasal drip, epistaxis, sore throat, earache, hearing loss, dental pain, tinnitus, vertigo, sinus pain, snoring.  CV: Denies chest pain, palpitations, irregular heartbeat, syncope, dyspnea, diaphoresis, orthopnea, PND, claudication or edema. Respiratory: denies cough, dyspnea, DOE, pleurisy, hoarseness, laryngitis, wheezing.  Gastrointestinal: Denies dysphagia, odynophagia, heartburn, reflux, water brash, abdominal pain or cramps, nausea, vomiting, bloating, diarrhea, constipation, hematemesis, melena, hematochezia  or hemorrhoids. Genitourinary: Denies dysuria, frequency, urgency, nocturia, hesitancy, discharge, hematuria or flank pain. Musculoskeletal: Denies arthralgias, myalgias, stiffness, jt. swelling, pain, limping or strain/sprain.  Skin: Denies pruritus, rash, hives, warts, acne, eczema or change in skin lesion(s). Neuro: No weakness, tremor, incoordination, spasms, paresthesia or pain. Psychiatric: Denies confusion, memory loss or sensory loss. Endo: Denies change in weight, skin or hair change.  Heme/Lymph: No excessive bleeding, bruising or enlarged lymph nodes.  Physical Exam  BP 128/80   Pulse 75   Temp (!) 96.5 F (35.8 C)   Resp 17   Ht 5' 4"  (1.626 m)   Wt 145 lb 12.8 oz (66.1 kg)   SpO2 97%   BMI 25.03 kg/m   Appears  well nourished, well groomed  and in no distress.  Eyes: PERRLA, EOMs, conjunctiva no swelling or erythema. Sinuses: No frontal/maxillary tenderness ENT/Mouth: EAC's clear, TM's nl w/o  erythema, bulging. Nares clear w/o erythema, swelling, exudates. Oropharynx clear without erythema or exudates. Oral hygiene is good. Tongue normal, non obstructing. Hearing intact.  Neck: Supple. Thyroid not palpable. Car 2+/2+ without bruits, nodes or JVD. Chest: Respirations nl with BS clear & equal w/o rales, rhonchi, wheezing or stridor.  Cor: Heart sounds normal w/ regular rate and rhythm without sig. murmurs, gallops, clicks or rubs. Peripheral pulses normal and equal  without edema.  Abdomen: Soft & bowel sounds normal. Non-tender w/o guarding, rebound, hernias, masses or organomegaly.  Lymphatics: Unremarkable.  Musculoskeletal: Full ROM all peripheral extremities, joint stability, 5/5 strength and normal gait.  Skin: Warm, dry without exposed rashes, lesions or ecchymosis apparent.  Neuro: Cranial nerves intact, reflexes equal bilaterally. Sensory-motor testing grossly intact. Tendon reflexes grossly intact.  Pysch: Alert & oriented x 3.  Insight and judgement nl & appropriate. No ideations.  Assessment and Plan   1. Essential hypertension  - Magnesium - TSH  2. Hyperlipidemia associated with type 2 diabetes mellitus ([place])  - Continue diet/meds, exercise,& lifestyle modifications.  - Continue monitor periodic cholesterol/liver & renal functions  - Lipid panel - TSH  3. Type 2 diabetes mellitus with stage 3b chronic kidney  disease, without long-term current use of insulin ([facility])  - Hemoglobin A1c - Insulin, random  4. Vitamin D deficiency  - Continue diet, exercise  - Lifestyle modifications.  - Monitor appropriate labs. - Continue supplementation.   - VITAMIN D 25 Hydroxy  5. Idiopathic gout  - Uric acid  6. Malignant neoplasm of ovary([facility])   7. Aortic atherosclerosis ([place]) by Abd CT scan on 05/10/2021  - Lipid panel  8. Medication management - Magnesium - Lipid panel - TSH - Hemoglobin A1c - Insulin, random - VITAMIN D 25 Hydroxy - Uric  acid        Discussed  regular exercise, BP monitoring, weight control to achieve/maintain BMI less than 25 and discussed med and SE's. Recommended labs to assess and monitor clinical status with further disposition pending results of labs.  I discussed the assessment and treatment plan with the patient. The patient was provided an opportunity to ask questions and all were answered. The patient agreed with  the plan and demonstrated an understanding of the instructions.  I provided over 30 minutes of exam, counseling, chart review and  complex critical decision making.        The patient was advised to call back or seek an in-person evaluation if the symptoms worsen or if the condition fails to improve as anticipated.   Kirtland Bouchard, MD

## 2021-06-11 ENCOUNTER — Ambulatory Visit (INDEPENDENT_AMBULATORY_CARE_PROVIDER_SITE_OTHER): Payer: PPO | Admitting: Internal Medicine

## 2021-06-11 ENCOUNTER — Encounter: Payer: Self-pay | Admitting: Internal Medicine

## 2021-06-11 ENCOUNTER — Other Ambulatory Visit: Payer: Self-pay

## 2021-06-11 VITALS — BP 128/80 | HR 75 | Temp 96.5°F | Resp 17 | Ht 64.0 in | Wt 145.8 lb

## 2021-06-11 DIAGNOSIS — E1169 Type 2 diabetes mellitus with other specified complication: Secondary | ICD-10-CM

## 2021-06-11 DIAGNOSIS — I10 Essential (primary) hypertension: Secondary | ICD-10-CM

## 2021-06-11 DIAGNOSIS — C569 Malignant neoplasm of unspecified ovary: Secondary | ICD-10-CM | POA: Diagnosis not present

## 2021-06-11 DIAGNOSIS — E559 Vitamin D deficiency, unspecified: Secondary | ICD-10-CM

## 2021-06-11 DIAGNOSIS — L600 Ingrowing nail: Secondary | ICD-10-CM | POA: Diagnosis not present

## 2021-06-11 DIAGNOSIS — Z79899 Other long term (current) drug therapy: Secondary | ICD-10-CM | POA: Diagnosis not present

## 2021-06-11 DIAGNOSIS — M1000 Idiopathic gout, unspecified site: Secondary | ICD-10-CM | POA: Diagnosis not present

## 2021-06-11 DIAGNOSIS — E785 Hyperlipidemia, unspecified: Secondary | ICD-10-CM

## 2021-06-11 DIAGNOSIS — N1832 Chronic kidney disease, stage 3b: Secondary | ICD-10-CM | POA: Diagnosis not present

## 2021-06-11 DIAGNOSIS — I700 Atherosclerosis of aorta: Secondary | ICD-10-CM

## 2021-06-11 DIAGNOSIS — E1122 Type 2 diabetes mellitus with diabetic chronic kidney disease: Secondary | ICD-10-CM

## 2021-06-11 NOTE — Patient Instructions (Signed)

## 2021-06-11 NOTE — Addendum Note (Signed)
Addended by: [name] on: 06/11/2021 09:28 PM   Modules accepted: Orders

## 2021-06-12 LAB — LIPID PANEL
Cholesterol: 142 mg/dL (ref <200)
HDL: 35 mg/dL — ABNORMAL LOW (ref >=50)
LDL Cholesterol (Calc): 82 mg/dL (calc)
Non-HDL Cholesterol (Calc): 107 mg/dL (calc) (ref <130)
Total CHOL/HDL Ratio: 4.1 (calc) (ref <5.0)
Triglycerides: 152 mg/dL — ABNORMAL HIGH (ref <150)

## 2021-06-12 LAB — TSH: TSH: 3.8 mIU/L (ref 0.40–4.50)

## 2021-06-12 LAB — HEMOGLOBIN A1C
Hgb A1c MFr Bld: 6.4 % of total Hgb — ABNORMAL HIGH (ref <5.7)
Mean Plasma Glucose: 137 mg/dL
eAG (mmol/L): 7.6 mmol/L

## 2021-06-12 LAB — URIC ACID: Uric Acid, Serum: 5.3 mg/dL (ref 2.5–7.0)

## 2021-06-12 LAB — MAGNESIUM: Magnesium: 1.6 mg/dL (ref 1.5–2.5)

## 2021-06-12 LAB — VITAMIN D 25 HYDROXY (VIT D DEFICIENCY, FRACTURES): Vit D, 25-Hydroxy: 66 ng/mL (ref 30–100)

## 2021-06-12 LAB — INSULIN, RANDOM: Insulin: 33.2 u[IU]/mL — ABNORMAL HIGH

## 2021-06-12 NOTE — Progress Notes (Signed)
============================================================ -   Test results slightly outside the reference range are not unusual. If there is anything important, I will review this with you,  otherwise it is considered normal test values.  If you have further questions,  please do not hesitate to contact me at the office or via My Chart.  ============================================================ ============================================================  -   Magnesium  -   1.4   -  very  low- goal is betw 2.0 - 2.5,   - So..............Marland Kitchen  Recommend that you take  Magnesium 500 mg tablet daily   - also important to eat lots of  leafy green vegetables   - spinach - Kale - collards - greens - okra - asparagus  - broccoli - quinoa - squash - almonds   - black, red, white beans  -  peas - green beans ============================================================ ============================================================  -  Total Chol = 142   and LDL Chol = 82  - Both  Excellent   - Very low risk for Heart Attack  / Stroke ============================================================ ============================================================  -  A1c = 6.4% - still a little elevated -- Ideal or Goal is less than 5.7%   ============================================================ ============================================================  -  Vitamin D = 66 - Excellent  ============================================================ ============================================================  -  Uric Acid - Gout test is Normal / OK  ============================================================ ============================================================  -  All Else - CBC - Kidneys - Electrolytes - Liver - Magnesium & Thyroid    - all  Normal /  OK ============================================================ ============================================================

## 2021-06-13 ENCOUNTER — Other Ambulatory Visit: Payer: Self-pay

## 2021-06-13 ENCOUNTER — Inpatient Hospital Stay: Payer: PPO | Admitting: Hematology and Oncology

## 2021-06-13 ENCOUNTER — Inpatient Hospital Stay: Payer: PPO | Attending: Gynecologic Oncology

## 2021-06-13 ENCOUNTER — Encounter: Payer: Self-pay | Admitting: Hematology and Oncology

## 2021-06-13 DIAGNOSIS — Z79899 Other long term (current) drug therapy: Secondary | ICD-10-CM | POA: Insufficient documentation

## 2021-06-13 DIAGNOSIS — N183 Chronic kidney disease, stage 3 unspecified: Secondary | ICD-10-CM

## 2021-06-13 DIAGNOSIS — D61818 Other pancytopenia: Secondary | ICD-10-CM | POA: Diagnosis not present

## 2021-06-13 DIAGNOSIS — C569 Malignant neoplasm of unspecified ovary: Secondary | ICD-10-CM

## 2021-06-13 DIAGNOSIS — Z90721 Acquired absence of ovaries, unilateral: Secondary | ICD-10-CM | POA: Diagnosis not present

## 2021-06-13 DIAGNOSIS — E1122 Type 2 diabetes mellitus with diabetic chronic kidney disease: Secondary | ICD-10-CM

## 2021-06-13 DIAGNOSIS — Z170 Estrogen receptor positive status [ER+]: Secondary | ICD-10-CM | POA: Diagnosis not present

## 2021-06-13 DIAGNOSIS — C786 Secondary malignant neoplasm of retroperitoneum and peritoneum: Secondary | ICD-10-CM | POA: Insufficient documentation

## 2021-06-13 DIAGNOSIS — Z79811 Long term (current) use of aromatase inhibitors: Secondary | ICD-10-CM | POA: Diagnosis not present

## 2021-06-13 DIAGNOSIS — C561 Malignant neoplasm of right ovary: Secondary | ICD-10-CM | POA: Insufficient documentation

## 2021-06-13 DIAGNOSIS — C50212 Malignant neoplasm of upper-inner quadrant of left female breast: Secondary | ICD-10-CM | POA: Insufficient documentation

## 2021-06-13 DIAGNOSIS — R918 Other nonspecific abnormal finding of lung field: Secondary | ICD-10-CM | POA: Diagnosis not present

## 2021-06-13 DIAGNOSIS — E1140 Type 2 diabetes mellitus with diabetic neuropathy, unspecified: Secondary | ICD-10-CM | POA: Insufficient documentation

## 2021-06-13 LAB — COMPREHENSIVE METABOLIC PANEL
ALT: 35 U/L (ref 0–44)
AST: 53 U/L — ABNORMAL HIGH (ref 15–41)
Albumin: 3.5 g/dL (ref 3.5–5.0)
Alkaline Phosphatase: 117 U/L (ref 38–126)
Anion gap: 10 (ref 5–15)
BUN: 24 mg/dL — ABNORMAL HIGH (ref 8–23)
CO2: 29 mmol/L (ref 22–32)
Calcium: 10 mg/dL (ref 8.9–10.3)
Chloride: 102 mmol/L (ref 98–111)
Creatinine, Ser: 1.82 mg/dL — ABNORMAL HIGH (ref 0.44–1.00)
GFR, Estimated: 27 mL/min — ABNORMAL LOW (ref >60)
Glucose, Bld: 184 mg/dL — ABNORMAL HIGH (ref 70–99)
Potassium: 4.2 mmol/L (ref 3.5–5.1)
Sodium: 141 mmol/L (ref 135–145)
Total Bilirubin: 0.6 mg/dL (ref 0.3–1.2)
Total Protein: 7 g/dL (ref 6.5–8.1)

## 2021-06-13 LAB — CBC WITH DIFFERENTIAL/PLATELET
Abs Immature Granulocytes: 0.02 10*3/uL (ref 0.00–0.07)
Basophils Absolute: 0 10*3/uL (ref 0.0–0.1)
Basophils Relative: 1 %
Eosinophils Absolute: 0.1 10*3/uL (ref 0.0–0.5)
Eosinophils Relative: 2 %
HCT: 32.9 % — ABNORMAL LOW (ref 36.0–46.0)
Hemoglobin: 11.1 g/dL — ABNORMAL LOW (ref 12.0–15.0)
Immature Granulocytes: 1 %
Lymphocytes Relative: 17 %
Lymphs Abs: 0.7 10*3/uL (ref 0.7–4.0)
MCH: 31.4 pg (ref 26.0–34.0)
MCHC: 33.7 g/dL (ref 30.0–36.0)
MCV: 92.9 fL (ref 80.0–100.0)
Monocytes Absolute: 0.4 10*3/uL (ref 0.1–1.0)
Monocytes Relative: 11 %
Neutro Abs: 2.8 10*3/uL (ref 1.7–7.7)
Neutrophils Relative %: 68 %
Platelets: 75 10*3/uL — ABNORMAL LOW (ref 150–400)
RBC: 3.54 MIL/uL — ABNORMAL LOW (ref 3.87–5.11)
RDW: 14.5 % (ref 11.5–15.5)
WBC: 4.1 10*3/uL (ref 4.0–10.5)
nRBC: 0 % (ref 0.0–0.2)

## 2021-06-13 MED ORDER — OLAPARIB 100 MG PO TABS: 100.0000 mg | ORAL_TABLET | ORAL | Status: DC

## 2021-06-13 NOTE — Assessment & Plan Note (Signed)
2 weeks ago, we increase olaparib to 4 days a week Unfortunately, she does not tolerate that dose I recommend changing her olaparib treatment to 100 mg on Mondays, Wednesdays, Fridays only With this regimen, she will be taking 300 mg weekly total She will return again in 2 weeks for further follow-up and repeat blood work

## 2021-06-13 NOTE — Assessment & Plan Note (Signed)
So far, she is not symptomatic with anemia and thrombocytopenia We will continue to monitor closely and she will proceed with dose changes as outlined above

## 2021-06-13 NOTE — Progress Notes (Signed)
HEMATOLOGY-ONCOLOGY ELECTRONIC VISIT PROGRESS NOTE  Patient Care Team: [name], MD as PCP - Kinnie Scales, [place] as Referring Physician (Optometry) [name], MD as Consulting Physician (Dermatology) Latanya Maudlin, MD as Consulting Physician (Orthopedic Surgery) Inda Castle, MD (Inactive) as Consulting Physician (Gastroenterology) Jacqulyn Liner, RN as Oncology Nurse Navigator (Oncology) Heath Lark, MD as Consulting Physician (Hematology and Oncology) Madelon Lips, MD as Consulting Physician (Nephrology) Rush Landmark, [facility] as Pharmacist (Pharmacist)  I connected with the patient via telephone call for her virtual visit today  ASSESSMENT & PLAN:  Ovarian cancer ([place]) 2 weeks ago, we increase olaparib to 4 days a week Unfortunately, she does not tolerate that dose I recommend changing her olaparib treatment to 100 mg on Mondays, Wednesdays, Fridays only With this regimen, she will be taking 300 mg weekly total She will return again in 2 weeks for further follow-up and repeat blood work  Pancytopenia, acquired ([place]) So far, she is not symptomatic with anemia and thrombocytopenia We will continue to monitor closely and she will proceed with dose changes as outlined above  CKD stage 3 due to type 2 diabetes mellitus ([place]) Her blood sugar is high and is associated with slightly elevated creatinine Reminded her to increase oral fluid intake as tolerated and to follow-up with primary care doctor for medication adjustment  No orders of the defined types were placed in this encounter.   INTERVAL HISTORY: Please see below for problem oriented charting. 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[place])  02/26/2016 Initial Diagnosis   Left breast biopsy 11:00 position: invasive ductal carcinoma with DCIS, ER 90%, PR 10%, HER-2 negative, Ki-67 30%, grade 2, 2.2 cm palpable lesion T2 N0 stage II a clinical stage    03/18/2016 Surgery   Left lumpectomy: Invasive ductal carcinoma, grade 2, 6.3 cm, with high-grade DCIS, margins negative, 0/4 lymph nodes negative, ER 90%, via 10%, HER-2 negative ratio 0.97, Ki-67 30%, T3 N0 stage IIB    03/25/2016 Procedure   Genetic testing is negative for pathogenic mutations within any of the 20 Genes on the breast/ovarian cancer panel    04/04/2016 Oncotype testing   Oncotype DX recurrence score 37, 25% 10 year distant risk of recurrence    04/17/2016 - 07/31/2016 Chemotherapy   Adjuvant chemotherapy with dose dense Adriamycin and Cytoxan followed by Abraxane weekly 8 ( discontinued due to neuropathy)   09/01/2016 - 09/26/2016 Radiation Therapy   Adj XRT 1) Left breast: 42.5 Gy in 17 fractions. 2) Left breast boost: 7.5 Gy in 3 fractions.    12/02/2016 -  Anti-estrogen oral therapy   Anastrozole 1 mg daily    Ovarian cancer ([place])  04/03/2020 Imaging   US pelvis Complex cystic and solid mass in LEFT adnexa 12.5 cm diameter question cystic ovarian neoplasm; recommend correlation with serum tumor markers and further evaluation by MR imaging with and without contrast.     04/04/2020 Imaging   US venous Doppler No evidence of deep venous thrombosis in the right lower extremity. Left common femoral vein also patent.   04/15/2020 Imaging   MRI pelvis 1. Large complex solid and cystic mass arising from the  right adnexa measuring 10.8 by 11.5 by 11.1 cm. This has an aggressive appearance with extensive enhancing mural soft tissue components. Findings are highly suspicious for malignant ovarian neoplasm. 2. Extensive bilateral retroperitoneal and bilateral iliac adenopathy  compatible with metastatic disease. 3. Signs of extensive peritoneal carcinomatosis including ascites, enhancement, thickening and nodularity of the peritoneal reflections, omental caking and bulky peritoneal nodularity. 4. Suspected serosal involvement of the dome of bladder with loss of normal fat plane. Cannot rule out mural invasion by tumor.   04/17/2020 Tumor Marker   Patient's tumor was tested for the following markers: CA-125 Results of the tumor marker test revealed 1762.   04/20/2020 Cancer Staging   Staging form: Ovary, Fallopian Tube, and Primary Peritoneal Carcinoma, AJCC 8th Edition - Clinical: FIGO Stage IIIC (cT3, cN1, cM0) - Signed by Heath Lark, MD on 04/20/2020    04/23/2020 Imaging   1. Redemonstrated dominant mixed solid and cystic mass arising from the vicinity of the right ovary measuring at least 11.5 x 10.7 cm, not significantly changed compared to prior MR, and consistent with primary ovarian malignancy.  2. Numerous bulky retroperitoneal, bilateral iliac, and pelvic sidewall lymph nodes. 3. Moderate volume ascites throughout the abdomen and pelvis with subtle thickening and nodularity throughout the peritoneum, and extensive bulky nodular metastatic disease of the omentum. 4. Constellation of findings is consistent with advanced nodal and peritoneal metastatic disease. 5. There are prominent subcentimeter epicardial lymph nodes, nonspecific although suspicious for nodal metastatic disease. No definite nodal metastatic disease in the chest. Attention on follow-up. 6. There are multiple small subpleural nodules at the right lung base overlying the diaphragm, measuring up to 7 mm. These are generally nonspecific and less favored to represent pulmonary metastatic disease given distribution. Attention on follow-up. 7. Somewhat coarse contour of the liver, suggestive of cirrhosis, although out without overt morphologic stigmata. 8. Aortic Atherosclerosis (ICD10-I70.0).      04/24/2020 Procedure   Successful placement of a right internal jugular approach power injectable Port-A-Cath. The catheter is ready for immediate use.     05/02/2020 Tumor Marker   Patient's tumor was tested for the following markers: CA-125 Results of the tumor marker test revealed 1921   05/03/2020 - 12/12/2020 Chemotherapy   The patient had carboplatin and taxol for chemotherapy treatment.     05/08/2020 Procedure   Successful ultrasound-guided paracentesis yielding 2.6 liters of peritoneal fluid.   05/16/2020 Procedure   Successful ultrasound-guided paracentesis yielding 3.2 liters of peritoneal fluid   05/25/2020 Procedure   Successful ultrasound-guided paracentesis yielding 3 liters of peritoneal fluid.     06/01/2020 Procedure   Successful ultrasound-guided therapeutic paracentesis yielding 1.7 liters of peritoneal fluid.   06/14/2020 Tumor Marker   Patient's tumor was tested for the following markers: CA-125 Results of the tumor marker test revealed 1309   06/20/2020 Imaging   1. Dominant mixed cystic and solid mass in the central pelvis is stable in the interval. 2. Clear interval decrease in retroperitoneal and pelvic sidewall lymphadenopathy. The bulky omental disease has also clearly decreased in the interval. 3. Interval decrease in ascites. 4. Stable 8 mm subpleural nodule along the diaphragm. 5. Aortic Atherosclerosis (ICD10-I70.0).   07/10/2020 Tumor Marker   Patient's tumor was tested for the following markers: CA-125 Results of the tumor marker test revealed 220   08/03/2020 Tumor Marker   Patient's tumor was tested for the following markers: CA-125 Results of the tumor marker test revealed 53.3.   09/03/2020 Tumor Marker   Patient's tumor was  tested for the following markers: CA-125 Results of the tumor marker test revealed 29.7   09/20/2020 Imaging   1. Substantial improvement. The right ovarian cystic and solid mass is half the volume that it measured on  06/20/2020. Similar reduction in the bulk of the omental caking of tumor. Prior ascites is resolved and the retroperitoneal adenopathy is markedly improved. 2. Other imaging findings of potential clinical significance: Stable pleural-based nodularity along the right hemidiaphragm measuring about 1.1 by 0.7 by 0.3 cm. Stable hypodense lesion of the right kidney upper pole, probably a cyst although the configuration of this lesion makes it difficult to obtain accurate density measurements. Left ovarian cyst, stable. Chondrocalcinosis involving the acetabular labra. Mild lumbar spondylosis and degenerative disc disease. 3. Aortic atherosclerosis.     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 (stage ypT3c), predominantly viable with focal fibrosis and hemosiderin laden macrophages (possible mild treatment effect)  B: Ovary and fallopian tube, right, salpingo-oophorectomy - High grade serous carcinoma involving right ovary and fallopian tube with surface involvement, size up to 8 cm  - Areas of necrosis and hemosiderin laden macrophages suggestive of treatment effect - 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 - See synoptic report and comment  C: Ovary and fallopian tube, left, salpingo-oophorectomy - High grade serous carcinoma involving the left fallopian tube, size up to 0.5 cm - Ovary with no definite involvement by carcinoma identified - Nodular area of endometriosis and peritoneal inclusion cyst also present - See synoptic report and comment   Procedure:    Bilateral salpingo-oophorectomy    Procedure:    Omentectomy    Specimen Integrity of Right Ovary:    Intact with surface involvement by tumor    Specimen Integrity  of Left Ovary:    Capsule intact   TUMOR Tumor Site:    Right fallopian tube: favor as primary site; also involves right ovary and left fallopian tube  Histologic Type:    Serous carcinoma  Histologic Grade:    High grade  Tumor Size:    Greatest Dimension (Centimeters): in fallopian tube: 1.0 cm; in ovary: 8.0 cm Ovarian Surface Involvement:    Present    Laterality:    Right  Fallopian Tube Surface Involvement:    Present    Laterality:    Bilateral  Other Tissue / Organ Involvement:    Right ovary  Other Tissue / Organ Involvement:    Right fallopian tube  Other Tissue / Organ Involvement:    Left fallopian tube  Other Tissue / Organ Involvement:    Omentum  Largest Extrapelvic Peritoneal Focus:    Macroscopic (greater than 2 cm)  Peritoneal / Ascitic Fluid:    Not submitted / unknown  Pleural Fluid:    Not submitted / unknown  Treatment Effect:    No definite or minimal response identified (chemotherapy response score [CRS] 1)   LYMPH NODES Regional Lymph Nodes:    No lymph nodes submitted or found   PATHOLOGIC STAGE CLASSIFICATION (pTNM, AJCC 8th Edition) TNM Descriptors:    y (post-treatment)  Primary Tumor (pT):    pT3c  Regional Lymph Nodes (pN):    pNX   FIGO STAGE FIGO Stage:    IIIC   ADDITIONAL FINDINGS Additional Findings:    Serous tubal intraepithelial carcinoma (STIC)  Additional Findings:    Left ovary with no definite carcinoma identified, left-sided nodule of endometriosis and peritoneal inclusion cyst    10/18/2020 - 10/20/2020 Hospital Admission   She was [facility] to [facility] for interval debulking surgery   11/19/2020 Tumor Marker   Patient's tumor was tested for the following markers: CA-125. Results of the tumor marker test revealed 38.3   12/12/2020 Tumor Marker   Patient's tumor was tested for the following markers CA-125. Results of the tumor marker test revealed 42.3.   01/02/2021 Tumor Marker   Patient's tumor was tested for the following  markers: CA-125. Results of the tumor marker test revealed 37.3   01/29/2021 Tumor Marker   Patient's tumor was tested for the following markers: CA-125 Results of the tumor marker test revealed 44.3   01/29/2021 Imaging   1. Interval increase in loculated appearing ascites in the low central abdomen and pelvis. 2. There is extensive peritoneal nodularity surrounding this fluid, the nodularity itself not appreciably changed in appearance compared to prior examination. 3. Additional peritoneal nodularity and/or mesenteric lymph nodes are unchanged. 4. Interval decrease in size of a low-attenuation nodule overlying the right hemidiaphragm, now measuring 1.2 cm, previously 1.8 cm when measured similarly. Findings are consistent with treatment response of a metastatic nodule. No other evidence of intrathoracic metastatic disease. 5. Status post hysterectomy, oophorectomy, and omentectomy.   01/31/2021 -  Chemotherapy   She is started on Olaparib       03/04/2021 Tumor Marker   Patient's tumor was tested for the following markers: CA-125 Results of the tumor marker test revealed 39.7   04/01/2021 Tumor Marker   Patient's tumor was tested for the following markers: CA-125 Results of the tumor marker test revealed 32.3   05/10/2021 Imaging   1. No significant change in peritoneal carcinomatosis since previous study of 3 months ago, primarily in the pelvis where there is complex fluid and peritoneal nodularity. 2. No evidence of solid visceral organ metastasis. No evidence of bowel or ureteral obstruction. 3.  Aortic Atherosclerosis (ICD10-I70.0).     REVIEW OF SYSTEMS:   Constitutional: Denies fevers, chills or abnormal weight loss Eyes: Denies blurriness of vision Ears, nose, mouth, throat, and face: Denies mucositis or sore throat Respiratory: Denies cough, dyspnea or wheezes Cardiovascular: Denies palpitation, chest discomfort Gastrointestinal:  Denies nausea, heartburn or change in bowel  habits Skin: Denies abnormal skin rashes Lymphatics: Denies new lymphadenopathy or easy bruising Neurological:Denies numbness, tingling or new weaknesses Behavioral/Psych: Mood is stable, no new changes  Extremities: No lower extremity edema All other systems were reviewed with the patient and are negative.  I have reviewed the past medical history, past surgical history, social history and family history with the patient and they are unchanged from previous note.  ALLERGIES:  is allergic to keflex [cephalexin], meloxicam, prednisone, premarin [estrogens conjugated], and trovan [alatrofloxacin].  MEDICATIONS:  Current Outpatient Medications  Medication Sig Dispense Refill   acetaminophen (TYLENOL) 325 MG tablet Take 325 mg by mouth every 6 (six) hours as needed for headache.     allopurinol (ZYLOPRIM) 300 MG tablet TAKE 1 TABLET BY MOUTH DAILY FOR GOUT PREVENTION 90 tablet 1   blood glucose meter kit and supplies KIT Dispense based on patient and insurance preference. Use to check blood sugar once daily. Dx: E11.22, N18.30 1 each 12   Cholecalciferol (VITAMIN D3) 5000 units CAPS Take 5,000 Units by mouth daily.     cyanocobalamin 1000 MCG tablet Take 1,000 mcg by mouth daily.  ezetimibe (ZETIA) 10 MG tablet Patient knows to take by mouth  !   - Thanks 90 tablet 3   gabapentin (NEURONTIN) 300 MG capsule Take 1 capsule in the morning and afternoon and 2 capsules at night 360 capsule 3   glimepiride (AMARYL) 1 MG tablet Take 1 tab by mouth in the morning if fasting is running 150+. Please do not take this medication if you are ill or not eating as this can cause a low blood sugar. 90 tablet 1   glucose blood (FREESTYLE TEST STRIPS) test strip Check Blood Sugar Daily as directed (Dx: E11.29) 100 strip 3   hydrochlorothiazide (HYDRODIURIL) 25 MG tablet TAKE ONE TABLET BY MOUTH DAILY FOR FOR BLOOD PRESSURE AND FLUID RETENTION/ ANKLE SWELLING 90 tablet 1   ketoconazole (NIZORAL) 2 % cream Apply  1 application topically 2 (two) times daily. To groin rash (Patient not taking: Reported on 06/11/2021) 60 g 0   Lancets (FREESTYLE) lancets CHECK BLOOD SUGAR DAILY AS DIRECTED 100 each 3   lidocaine-prilocaine (EMLA) cream Apply to affected area once (Patient taking differently: Apply 1 application topically daily as needed (port access).) 30 g 3   loratadine (CLARITIN) 10 MG tablet Take 10 mg by mouth daily as needed for allergies.     Magnesium 250 MG TABS Take 250 mg by mouth every other day.     metoprolol succinate (TOPROL-XL) 25 MG 24 hr tablet TAKE 1 TABLET BY MOUTH DAILY FOR BLOOD PRESSURE (Patient taking differently: Take 25 mg by mouth daily.) 90 tablet 3   [START ON 06/14/2021] olaparib (LYNPARZA) 100 MG tablet Take 1 tablet (100 mg total) by mouth 3 (three) times a week. Take 1 tab by mouth on Mondays, Wednesdays and Fridays only     omeprazole (PRILOSEC) 20 MG capsule Take 1 capsule (20 mg total) by mouth daily. 30 capsule 11   ondansetron (ZOFRAN) 8 MG tablet Take 1 tablet (8 mg total) by mouth 2 (two) times daily as needed for refractory nausea / vomiting. Start on day 3 after carboplatin chemo. 30 tablet 1   prochlorperazine (COMPAZINE) 10 MG tablet Take 1 tablet (10 mg total) by mouth every 6 (six) hours as needed for nausea or vomiting. (Patient not taking: Reported on 06/11/2021) 60 tablet 9   rosuvastatin (CRESTOR) 5 MG tablet Take 1 tablet (5 mg total) by mouth 3 (three) times a week. In the evening for cholesterol. 36 tablet 3   vitamin C (ASCORBIC ACID) 500 MG tablet Take 500 mg by mouth every evening.      No current facility-administered medications for this visit.    PHYSICAL EXAMINATION: ECOG PERFORMANCE STATUS: 0 - Asymptomatic  LABORATORY DATA:  I have reviewed the data as listed CMP Latest Ref Rng & Units 06/13/2021 05/30/2021 05/21/2021  Glucose 70 - 99 mg/dL 184(H) 183(H) 162(H)  BUN 8 - 23 mg/dL 24(H) 28(H) 23  Creatinine 0.44 - 1.00 mg/dL 1.82(H) 1.62(H) 1.58(H)   Sodium 135 - 145 mmol/L 141 141 140  Potassium 3.5 - 5.1 mmol/L 4.2 3.7 3.6  Chloride 98 - 111 mmol/L 102 106 106  CO2 22 - 32 mmol/L _0 Calcium 8.9 - 10.3 mg/dL 10.0 9.6 9.8  Total Protein 6.5 - 8.1 g/dL 7.0 6.6 7.0  Total Bilirubin 0.3 - 1.2 mg/dL 0.6 0.5 0.5  Alkaline Phos 38 - 126 U/L 117 114 113  AST 15 - 41 U/L 53(H) 40 41  ALT 0 - 44 U/L 35 25 37  Lab Results  Component Value Date   WBC 4.1 06/13/2021   HGB 11.1 (L) 06/13/2021   HCT 32.9 (L) 06/13/2021   MCV 92.9 06/13/2021   PLT 75 (L) 06/13/2021   NEUTROABS 2.8 06/13/2021     I discussed the assessment and treatment plan with the patient. The patient was provided an opportunity to ask questions and all were answered. The patient agreed with the plan and demonstrated an understanding of the instructions. The patient was advised to call back or seek an in-person evaluation if the symptoms worsen or if the condition fails to improve as anticipated.    I spent 20 minutes for the appointment reviewing test results, discuss management and coordination of care.  Heath Lark, MD 06/13/2021 1:25 PM

## 2021-06-13 NOTE — Assessment & Plan Note (Signed)
Her blood sugar is high and is associated with slightly elevated creatinine Reminded her to increase oral fluid intake as tolerated and to follow-up with primary care doctor for medication adjustment

## 2021-06-14 LAB — CA 125: Cancer Antigen (CA) 125: 48.3 U/mL — ABNORMAL HIGH (ref 0.0–38.1)

## 2021-06-17 ENCOUNTER — Telehealth: Payer: Self-pay | Admitting: Hematology and Oncology

## 2021-06-17 NOTE — Telephone Encounter (Signed)
Scheduled per 7/14 los. Called and spoke with pt husband confirmed 7/28 appt

## 2021-06-20 ENCOUNTER — Ambulatory Visit (INDEPENDENT_AMBULATORY_CARE_PROVIDER_SITE_OTHER): Payer: PPO | Admitting: Podiatry

## 2021-06-20 ENCOUNTER — Other Ambulatory Visit: Payer: Self-pay

## 2021-06-20 DIAGNOSIS — L603 Nail dystrophy: Secondary | ICD-10-CM

## 2021-06-20 DIAGNOSIS — M79674 Pain in right toe(s): Secondary | ICD-10-CM

## 2021-06-20 NOTE — Patient Instructions (Signed)
You can try "urea nail gel" on the thick toenail

## 2021-06-24 NOTE — Progress Notes (Signed)
Subjective:   Patient ID: Kayla Baker, female   DOB: 85 y.o.   MRN: [id number]   HPI 85 year old female presents the office today for concerns of her big toe becoming thickened discolored.  She says she had a removed several years ago and is currently not infected.  Is also covering the skin causing discomfort.  Denies any redness or drainage or any swelling.  She is diabetic her last A1c was 6.4 last morning blood sugar was 127.   Review of Systems  All other systems reviewed and are negative.  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[place]) 02/29/2016   skin- 2016- squamous- on right upper arm   GERD (gastroesophageal reflux disease)    Gout    takes Allopurinol daily   History of chemotherapy    History of colon polyps    benign   History of shingles    Hx of radiation therapy    Hyperlipidemia    Hypertension    Ovarian ca ([place]) dx'd 03/2020   Personal history of chemotherapy    2017   Personal history of radiation therapy    2017   Vitamin D deficiency     Past Surgical History:  Procedure Laterality Date   ABDOMINAL HYSTERECTOMY  1973   BREAST BIOPSY Left 02/26/2016   BREAST LUMPECTOMY Left 03/18/2016   CARDIAC CATHETERIZATION     patient denies this procedure   CATARACT EXTRACTION, BILATERAL  2014   right eye 2/14; left eye 3/3   COLONOSCOPY     HAND SURGERY Left 2012   INCONTINENCE SURGERY  2006   IR IMAGING GUIDED PORT INSERTION  04/24/2020   IR PARACENTESIS  05/08/2020   IR PARACENTESIS  05/16/2020   IR PARACENTESIS  05/25/2020   KNEE ARTHROSCOPY Right    MASTOPEXY Right 06/16/2017   Procedure: RIGHT BREAST MASTOPEXY;  Surgeon: Irene Limbo, MD;  Location: [facility];  Service: Plastics;  Laterality: Right;   PORT-A-CATH REMOVAL N/A 11/27/2016   Procedure: REMOVAL PORT-A-CATH;  Surgeon: Fanny Skates, MD;  Location: [facility];  Service: General;  Laterality: N/A;   PORTACATH PLACEMENT N/A 04/14/2016   Procedure: INSERTION PORT-A-CATH ;  Surgeon: Fanny Skates, MD;  Location: [facility];  Service: General;  Laterality: N/A;   RADIOACTIVE SEED GUIDED PARTIAL MASTECTOMY WITH AXILLARY SENTINEL LYMPH NODE BIOPSY Left 03/18/2016   Procedure: RADIOACTIVE SEED GUIDED LEFT PARTIAL MASTECTOMY WITH AXILLARY SENTINEL LYMPH NODE BIOPSY AND BLUE DYE INJECTION;  Surgeon: Fanny Skates, MD;  Location: [facility];  Service: General;  Laterality: Left;   REDUCTION MAMMAPLASTY Right 2018     Current Outpatient Medications:    acetaminophen (TYLENOL) 325 MG tablet, Take 325 mg by mouth every 6 (six) hours as needed for headache., Disp: , Rfl:    allopurinol (ZYLOPRIM) 300 MG tablet, TAKE 1 TABLET BY MOUTH DAILY FOR GOUT PREVENTION, Disp: 90 tablet, Rfl: 1   blood glucose meter kit and supplies KIT, Dispense based on patient and insurance preference. Use to check blood sugar once daily. Dx: E11.22, N18.30, Disp: 1 each, Rfl: 12   Cholecalciferol (VITAMIN D3) 5000 units CAPS, Take 5,000 Units by mouth daily., Disp: , Rfl:    cyanocobalamin 1000 MCG tablet, Take 1,000 mcg by mouth daily., Disp: , Rfl:    ezetimibe (ZETIA) 10 MG tablet, Patient  knows to take by mouth  !   - Thanks, Disp: 90 tablet, Rfl: 3   gabapentin (NEURONTIN) 300 MG capsule, Take 1 capsule in the morning and afternoon and 2 capsules at night, Disp: 360 capsule, Rfl: 3   glimepiride (AMARYL) 1 MG tablet, Take 1 tab by mouth in the morning if fasting is running 150+. Please do not take this medication if you are ill or not eating as this can cause a low blood sugar., Disp: 90 tablet, Rfl: 1   glucose blood (FREESTYLE TEST STRIPS) test strip, Check Blood Sugar Daily as directed (Dx: E11.29), Disp: 100 strip, Rfl: 3   hydrochlorothiazide (HYDRODIURIL) 25 MG tablet, TAKE ONE TABLET BY MOUTH DAILY FOR FOR BLOOD PRESSURE AND FLUID RETENTION/ ANKLE SWELLING,  Disp: 90 tablet, Rfl: 1   ketoconazole (NIZORAL) 2 % cream, Apply 1 application topically 2 (two) times daily. To groin rash (Patient not taking: Reported on 06/11/2021), Disp: 60 g, Rfl: 0   Lancets (FREESTYLE) lancets, CHECK BLOOD SUGAR DAILY AS DIRECTED, Disp: 100 each, Rfl: 3   lidocaine-prilocaine (EMLA) cream, Apply to affected area once (Patient taking differently: Apply 1 application topically daily as needed (port access).), Disp: 30 g, Rfl: 3   loratadine (CLARITIN) 10 MG tablet, Take 10 mg by mouth daily as needed for allergies., Disp: , Rfl:    Magnesium 250 MG TABS, Take 250 mg by mouth every other day., Disp: , Rfl:    metoprolol succinate (TOPROL-XL) 25 MG 24 hr tablet, TAKE 1 TABLET BY MOUTH DAILY FOR BLOOD PRESSURE (Patient taking differently: Take 25 mg by mouth daily.), Disp: 90 tablet, Rfl: 3   olaparib (LYNPARZA) 100 MG tablet, Take 1 tablet (100 mg total) by mouth 3 (three) times a week. Take 1 tab by mouth on Mondays, Wednesdays and Fridays only, Disp: , Rfl:    omeprazole (PRILOSEC) 20 MG capsule, Take 1 capsule (20 mg total) by mouth daily., Disp: 30 capsule, Rfl: 11   ondansetron (ZOFRAN) 8 MG tablet, Take 1 tablet (8 mg total) by mouth 2 (two) times daily as needed for refractory nausea / vomiting. Start on day 3 after carboplatin chemo., Disp: 30 tablet, Rfl: 1   prochlorperazine (COMPAZINE) 10 MG tablet, Take 1 tablet (10 mg total) by mouth every 6 (six) hours as needed for nausea or vomiting. (Patient not taking: Reported on 06/11/2021), Disp: 60 tablet, Rfl: 9   rosuvastatin (CRESTOR) 5 MG tablet, Take 1 tablet (5 mg total) by mouth 3 (three) times a week. In the evening for cholesterol., Disp: 36 tablet, Rfl: 3   vitamin C (ASCORBIC ACID) 500 MG tablet, Take 500 mg by mouth every evening. , Disp: , Rfl:   Allergies  Allergen Reactions   Keflex [Cephalexin] Swelling    Tongue and throat swells   Meloxicam Swelling   Prednisone Swelling and Other (See Comments)    Can  Not take High doses Cannot tolerate high doses Can Not take High doses   Premarin [Estrogens Conjugated] Hives   Trovan [Alatrofloxacin] Other (See Comments)    Unknown reaction          Objective:  Physical Exam  General: AAO x3, NAD  Dermatological: Hallux toenails hypertrophic, dystrophic, discolored on the right side.  There is no edema, erythema or signs of infection.  Mild incurvation of the nail.  Vascular: Dorsalis Pedis artery and Posterior Tibial artery pedal pulses are 2/4 bilateral with immedate capillary fill time. There is no pain with calf compression, swelling, warmth,  erythema.   Neruologic: Grossly intact via light touch bilateral.   Musculoskeletal: No gross boney pedal deformities bilateral. No pain, crepitus, or limitation noted with foot and ankle range of motion bilateral. Muscular strength 5/5 in all groups tested bilateral.     Assessment:   Symptomatic onychomycosis/onychodystrophy     Plan:  -Treatment options discussed including all alternatives, risks, and complications. -Etiology of symptoms were discussed -As courtesy debride the nail with any complications or bleeding.  Discussed nail removal versus conservative treatment.  She is can start using urea nail gel to help with thickening.  Trula Slade DPM

## 2021-06-27 ENCOUNTER — Encounter: Payer: Self-pay | Admitting: Hematology and Oncology

## 2021-06-27 ENCOUNTER — Inpatient Hospital Stay: Payer: PPO

## 2021-06-27 ENCOUNTER — Inpatient Hospital Stay: Payer: PPO | Admitting: Hematology and Oncology

## 2021-06-27 ENCOUNTER — Other Ambulatory Visit: Payer: Self-pay

## 2021-06-27 DIAGNOSIS — R911 Solitary pulmonary nodule: Secondary | ICD-10-CM

## 2021-06-27 DIAGNOSIS — E1122 Type 2 diabetes mellitus with diabetic chronic kidney disease: Secondary | ICD-10-CM

## 2021-06-27 DIAGNOSIS — C50212 Malignant neoplasm of upper-inner quadrant of left female breast: Secondary | ICD-10-CM

## 2021-06-27 DIAGNOSIS — D61818 Other pancytopenia: Secondary | ICD-10-CM | POA: Diagnosis not present

## 2021-06-27 DIAGNOSIS — C569 Malignant neoplasm of unspecified ovary: Secondary | ICD-10-CM

## 2021-06-27 DIAGNOSIS — N183 Chronic kidney disease, stage 3 unspecified: Secondary | ICD-10-CM

## 2021-06-27 DIAGNOSIS — C561 Malignant neoplasm of right ovary: Secondary | ICD-10-CM | POA: Diagnosis not present

## 2021-06-27 DIAGNOSIS — Z95828 Presence of other vascular implants and grafts: Secondary | ICD-10-CM

## 2021-06-27 LAB — COMPREHENSIVE METABOLIC PANEL
ALT: 27 U/L (ref 0–44)
AST: 39 U/L (ref 15–41)
Albumin: 3.6 g/dL (ref 3.5–5.0)
Alkaline Phosphatase: 108 U/L (ref 38–126)
Anion gap: 8 (ref 5–15)
BUN: 24 mg/dL — ABNORMAL HIGH (ref 8–23)
CO2: 29 mmol/L (ref 22–32)
Calcium: 10.2 mg/dL (ref 8.9–10.3)
Chloride: 105 mmol/L (ref 98–111)
Creatinine, Ser: 1.62 mg/dL — ABNORMAL HIGH (ref 0.44–1.00)
GFR, Estimated: 31 mL/min — ABNORMAL LOW (ref >60)
Glucose, Bld: 147 mg/dL — ABNORMAL HIGH (ref 70–99)
Potassium: 3.9 mmol/L (ref 3.5–5.1)
Sodium: 142 mmol/L (ref 135–145)
Total Bilirubin: 0.6 mg/dL (ref 0.3–1.2)
Total Protein: 6.8 g/dL (ref 6.5–8.1)

## 2021-06-27 LAB — CBC WITH DIFFERENTIAL/PLATELET
Abs Immature Granulocytes: 0.01 10*3/uL (ref 0.00–0.07)
Basophils Absolute: 0 10*3/uL (ref 0.0–0.1)
Basophils Relative: 1 %
Eosinophils Absolute: 0.2 10*3/uL (ref 0.0–0.5)
Eosinophils Relative: 3 %
HCT: 33.2 % — ABNORMAL LOW (ref 36.0–46.0)
Hemoglobin: 11.1 g/dL — ABNORMAL LOW (ref 12.0–15.0)
Immature Granulocytes: 0 %
Lymphocytes Relative: 24 %
Lymphs Abs: 1.3 10*3/uL (ref 0.7–4.0)
MCH: 31 pg (ref 26.0–34.0)
MCHC: 33.4 g/dL (ref 30.0–36.0)
MCV: 92.7 fL (ref 80.0–100.0)
Monocytes Absolute: 0.6 10*3/uL (ref 0.1–1.0)
Monocytes Relative: 10 %
Neutro Abs: 3.3 10*3/uL (ref 1.7–7.7)
Neutrophils Relative %: 62 %
Platelets: 110 10*3/uL — ABNORMAL LOW (ref 150–400)
RBC: 3.58 MIL/uL — ABNORMAL LOW (ref 3.87–5.11)
RDW: 14.7 % (ref 11.5–15.5)
WBC: 5.3 10*3/uL (ref 4.0–10.5)
nRBC: 0 % (ref 0.0–0.2)

## 2021-06-27 MED ORDER — SODIUM CHLORIDE 0.9% FLUSH
10.0000 mL | Freq: Once | INTRAVENOUS | Status: AC
  Administered 2021-06-27: 10 mL
  Filled 2021-06-27: qty 10

## 2021-06-27 MED ORDER — HEPARIN SOD (PORK) LOCK FLUSH 100 UNIT/ML IV SOLN
500.0000 [IU] | Freq: Once | INTRAVENOUS | Status: AC
  Administered 2021-06-27: 500 [IU]
  Filled 2021-06-27: qty 5

## 2021-06-27 NOTE — Progress Notes (Signed)
HEMATOLOGY-ONCOLOGY ELECTRONIC VISIT PROGRESS NOTE  Patient Care Team: [name], MD as PCP - Kinnie Scales, OD as Referring Physician (Optometry) [name], MD as Consulting Physician (Dermatology) Latanya Maudlin, MD as Consulting Physician (Orthopedic Surgery) Inda Castle, MD (Inactive) as Consulting Physician (Gastroenterology) Awanda Mink Craige Cotta, RN as Oncology Nurse Navigator (Oncology) Heath Lark, MD as Consulting Physician (Hematology and Oncology) Madelon Lips, MD as Consulting Physician (Nephrology) Rush Landmark, [facility] as Pharmacist (Pharmacist)  I connected with by telephone visit to review test results  ASSESSMENT & PLAN:  Ovarian cancer [facility]) I have reviewed test results with her I reviewed her previous CT imaging to address her question regarding the right lower base lung nodule Overall, she is responding well to treatment I recommend spacing out her lab draw to once a month I plan to repeat imaging study again in a few months Will continue reduced dose olaparib 100 mg 3 times a week  Pancytopenia, acquired ([place]) She tolerated reduced dose 3 times a week better She will continue the same  CKD stage 3 due to type 2 diabetes mellitus ([place]) Her chronic renal failure is stable I reminded the patient adequate hydration  Nodule of lower lobe of right lung I have reviewed multiple CT imaging She has lung nodule on the right lung base, smaller compared to previous exam The cause is unknown She had 2 different types of malignancies but with improvement of the size meaning, lung nodule smaller, it could represent malignant disease that has responded to treatment In any case, the plan will be to observe with serial imaging studies  No orders of the defined types were placed in this encounter.   INTERVAL HISTORY: Please see below for problem oriented charting. Since last time I saw her, she feels well Denies side effects from treatment No recent  infection, fever or chills Her energy level is fair She has questions regarding report on the lung nodule seen on prior CT imaging She denies chest pain or shortness of breath   SUMMARY OF ONCOLOGIC HISTORY: Oncology History Overview Note  HRD positive   Breast cancer of upper-inner quadrant of left female breast ([place])  02/26/2016 Initial Diagnosis   Left breast biopsy 11:00 position: invasive ductal carcinoma with DCIS, ER 90%, PR 10%, HER-2 negative, Ki-67 30%, grade 2, 2.2 cm palpable lesion T2 N0 stage II a clinical stage    03/18/2016 Surgery   Left lumpectomy: Invasive ductal carcinoma, grade 2, 6.3 cm, with high-grade DCIS, margins negative, 0/4 lymph nodes negative, ER 90%, via 10%, HER-2 negative ratio 0.97, Ki-67 30%, T3 N0 stage IIB    03/25/2016 Procedure   Genetic testing is negative for pathogenic mutations within any of the 20 Genes on the breast/ovarian cancer panel    04/04/2016 Oncotype testing   Oncotype DX recurrence score 37, 25% 10 year distant risk of recurrence    04/17/2016 - 07/31/2016 Chemotherapy   Adjuvant chemotherapy with dose dense Adriamycin and Cytoxan followed by Abraxane weekly 8 ( discontinued due to neuropathy)   09/01/2016 - 09/26/2016 Radiation Therapy   Adj XRT 1) Left breast: 42.5 Gy in 17 fractions. 2) Left breast boost: 7.5 Gy in 3 fractions.    12/02/2016 -  Anti-estrogen oral therapy   Anastrozole 1 mg daily    Ovarian cancer ([place])  04/03/2020 Imaging   US pelvis Complex cystic and solid mass in LEFT adnexa 12.5 cm diameter question cystic ovarian neoplasm; recommend correlation with serum tumor markers and further evaluation by MR  imaging with and without contrast.     04/04/2020 Imaging   US venous Doppler No evidence of deep venous thrombosis in the right lower extremity. Left common femoral vein also patent.   04/15/2020 Imaging   MRI pelvis 1. Large complex solid and cystic mass arising from the right adnexa measuring 10.8 by  11.5 by 11.1 cm. This has an aggressive appearance with extensive enhancing mural soft tissue components. Findings are highly suspicious for malignant ovarian neoplasm. 2. Extensive bilateral retroperitoneal and bilateral iliac adenopathy compatible with metastatic disease. 3. Signs of extensive peritoneal carcinomatosis including ascites, enhancement, thickening and nodularity of the peritoneal reflections, omental caking and bulky peritoneal nodularity. 4. Suspected serosal involvement of the dome of bladder with loss of normal fat plane. Cannot rule out mural invasion by tumor.   04/17/2020 Tumor Marker   Patient's tumor was tested for the following markers: CA-125 Results of the tumor marker test revealed 1762.   04/20/2020 Cancer Staging   Staging form: Ovary, Fallopian Tube, and Primary Peritoneal Carcinoma, AJCC 8th Edition - Clinical: FIGO Stage IIIC (cT3, cN1, cM0) - Signed by Heath Lark, MD on 04/20/2020    04/23/2020 Imaging   1. Redemonstrated dominant mixed solid and cystic mass arising from the vicinity of the right ovary measuring at least 11.5 x 10.7 cm, not significantly changed compared to prior MR, and consistent with primary ovarian malignancy.  2. Numerous bulky retroperitoneal, bilateral iliac, and pelvic sidewall lymph nodes. 3. Moderate volume ascites throughout the abdomen and pelvis with subtle thickening and nodularity throughout the peritoneum, and extensive bulky nodular metastatic disease of the omentum. 4. Constellation of findings is consistent with advanced nodal and peritoneal metastatic disease. 5. There are prominent subcentimeter epicardial lymph nodes, nonspecific although suspicious for nodal metastatic disease. No definite nodal metastatic disease in the chest. Attention on follow-up. 6. There are multiple small subpleural nodules at the right lung base overlying the diaphragm, measuring up to 7 mm. These are generally nonspecific and less favored to  represent pulmonary metastatic disease given distribution. Attention on follow-up. 7. Somewhat coarse contour of the liver, suggestive of cirrhosis, although out without overt morphologic stigmata. 8. Aortic Atherosclerosis (ICD10-I70.0).     04/24/2020 Procedure   Successful placement of a right internal jugular approach power injectable Port-A-Cath. The catheter is ready for immediate use.     05/02/2020 Tumor Marker   Patient's tumor was tested for the following markers: CA-125 Results of the tumor marker test revealed 1921   05/03/2020 - 12/12/2020 Chemotherapy   The patient had carboplatin and taxol for chemotherapy treatment.     05/08/2020 Procedure   Successful ultrasound-guided paracentesis yielding 2.6 liters of peritoneal fluid.   05/16/2020 Procedure   Successful ultrasound-guided paracentesis yielding 3.2 liters of peritoneal fluid   05/25/2020 Procedure   Successful ultrasound-guided paracentesis yielding 3 liters of peritoneal fluid.     06/01/2020 Procedure   Successful ultrasound-guided therapeutic paracentesis yielding 1.7 liters of peritoneal fluid.   06/14/2020 Tumor Marker   Patient's tumor was tested for the following markers: CA-125 Results of the tumor marker test revealed 1309   06/20/2020 Imaging   1. Dominant mixed cystic and solid mass in the central pelvis is stable in the interval. 2. Clear interval decrease in retroperitoneal and pelvic sidewall lymphadenopathy. The bulky omental disease has also clearly decreased in the interval. 3. Interval decrease in ascites. 4. Stable 8 mm subpleural nodule along the diaphragm. 5. Aortic Atherosclerosis (ICD10-I70.0).   07/10/2020 Tumor Marker  Patient's tumor was tested for the following markers: CA-125 Results of the tumor marker test revealed 220   08/03/2020 Tumor Marker   Patient's tumor was tested for the following markers: CA-125 Results of the tumor marker test revealed 53.3.   09/03/2020 Tumor Marker    Patient's tumor was tested for the following markers: CA-125 Results of the tumor marker test revealed 29.7   09/20/2020 Imaging   1. Substantial improvement. The right ovarian cystic and solid mass is half the volume that it measured on 06/20/2020. Similar reduction in the bulk of the omental caking of tumor. Prior ascites is resolved and the retroperitoneal adenopathy is markedly improved. 2. Other imaging findings of potential clinical significance: Stable pleural-based nodularity along the right hemidiaphragm measuring about 1.1 by 0.7 by 0.3 cm. Stable hypodense lesion of the right kidney upper pole, probably a cyst although the configuration of this lesion makes it difficult to obtain accurate density measurements. Left ovarian cyst, stable. Chondrocalcinosis involving the acetabular labra. Mild lumbar spondylosis and degenerative disc disease. 3. Aortic atherosclerosis.     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 (stage ypT3c), predominantly viable with focal fibrosis and hemosiderin laden macrophages (possible mild treatment effect)  B: Ovary and fallopian tube, right, salpingo-oophorectomy - High grade serous carcinoma involving right ovary and fallopian tube with surface involvement, size up to 8 cm  - Areas of necrosis and hemosiderin laden macrophages suggestive of treatment effect - 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 - See synoptic report and comment  C: Ovary and fallopian tube, left, salpingo-oophorectomy - High grade serous carcinoma involving the left fallopian tube, size up to 0.5 cm - Ovary with no definite involvement by carcinoma identified - Nodular area of endometriosis and peritoneal  inclusion cyst also present - See synoptic report and comment   Procedure:    Bilateral salpingo-oophorectomy    Procedure:    Omentectomy    Specimen Integrity of Right Ovary:    Intact with surface involvement by tumor    Specimen Integrity of Left Ovary:    Capsule intact   TUMOR Tumor Site:    Right fallopian tube: favor as primary site; also involves right ovary and left fallopian tube  Histologic Type:    Serous carcinoma  Histologic Grade:    High grade  Tumor Size:    Greatest Dimension (Centimeters): in fallopian tube: 1.0 cm; in ovary: 8.0 cm Ovarian Surface Involvement:    Present    Laterality:    Right  Fallopian Tube Surface Involvement:    Present    Laterality:    Bilateral  Other Tissue / Organ Involvement:    Right ovary  Other Tissue / Organ Involvement:    Right fallopian tube  Other Tissue / Organ Involvement:    Left fallopian tube  Other Tissue / Organ Involvement:    Omentum  Largest Extrapelvic Peritoneal Focus:    Macroscopic (greater than 2 cm)  Peritoneal / Ascitic Fluid:    Not submitted / unknown  Pleural Fluid:    Not submitted / unknown  Treatment Effect:    No definite or minimal response identified (chemotherapy response score [CRS] 1)   LYMPH NODES Regional Lymph Nodes:    No lymph nodes submitted or found   PATHOLOGIC STAGE CLASSIFICATION (pTNM, AJCC  8th Edition) TNM Descriptors:    y (post-treatment)  Primary Tumor (pT):    pT3c  Regional Lymph Nodes (pN):    pNX   FIGO STAGE FIGO Stage:    IIIC   ADDITIONAL FINDINGS Additional Findings:    Serous tubal intraepithelial carcinoma (STIC)  Additional Findings:    Left ovary with no definite carcinoma identified, left-sided nodule of endometriosis and peritoneal inclusion cyst    10/18/2020 - 10/20/2020 Hospital Admission   She was admitted to [facility] for interval debulking surgery   11/19/2020 Tumor Marker   Patient's tumor was tested for the following markers: CA-125. Results of  the tumor marker test revealed 38.3   12/12/2020 Tumor Marker   Patient's tumor was tested for the following markers CA-125. Results of the tumor marker test revealed 42.3.   01/02/2021 Tumor Marker   Patient's tumor was tested for the following markers: CA-125. Results of the tumor marker test revealed 37.3   01/29/2021 Tumor Marker   Patient's tumor was tested for the following markers: CA-125 Results of the tumor marker test revealed 44.3   01/29/2021 Imaging   1. Interval increase in loculated appearing ascites in the low central abdomen and pelvis. 2. There is extensive peritoneal nodularity surrounding this fluid, the nodularity itself not appreciably changed in appearance compared to prior examination. 3. Additional peritoneal nodularity and/or mesenteric lymph nodes are unchanged. 4. Interval decrease in size of a low-attenuation nodule overlying the right hemidiaphragm, now measuring 1.2 cm, previously 1.8 cm when measured similarly. Findings are consistent with treatment response of a metastatic nodule. No other evidence of intrathoracic metastatic disease. 5. Status post hysterectomy, oophorectomy, and omentectomy.   01/31/2021 -  Chemotherapy   She is started on Olaparib       03/04/2021 Tumor Marker   Patient's tumor was tested for the following markers: CA-125 Results of the tumor marker test revealed 39.7   04/01/2021 Tumor Marker   Patient's tumor was tested for the following markers: CA-125 Results of the tumor marker test revealed 32.3   05/10/2021 Imaging   1. No significant change in peritoneal carcinomatosis since previous study of 3 months ago, primarily in the pelvis where there is complex fluid and peritoneal nodularity. 2. No evidence of solid visceral organ metastasis. No evidence of bowel or ureteral obstruction. 3.  Aortic Atherosclerosis (ICD10-I70.0).   06/13/2021 Tumor Marker   Patient's tumor was tested for the following markers: CA-125. Results of the tumor  marker test revealed 48.3.     REVIEW OF SYSTEMS:   Constitutional: Denies fevers, chills or abnormal weight loss Eyes: Denies blurriness of vision Ears, nose, mouth, throat, and face: Denies mucositis or sore throat Respiratory: Denies cough, dyspnea or wheezes Cardiovascular: Denies palpitation, chest discomfort Gastrointestinal:  Denies nausea, heartburn or change in bowel habits Skin: Denies abnormal skin rashes Lymphatics: Denies new lymphadenopathy or easy bruising Neurological:Denies numbness, tingling or new weaknesses Behavioral/Psych: Mood is stable, no new changes  Extremities: No lower extremity edema All other systems were reviewed with the patient and are negative.  I have reviewed the past medical history, past surgical history, social history and family history with the patient and they are unchanged from previous note.  ALLERGIES:  is allergic to keflex [cephalexin], meloxicam, prednisone, premarin [estrogens conjugated], and trovan [alatrofloxacin].  MEDICATIONS:  Current Outpatient Medications  Medication Sig Dispense Refill   acetaminophen (TYLENOL) 325 MG tablet Take 325 mg by mouth every 6 (six) hours as needed for headache.  allopurinol (ZYLOPRIM) 300 MG tablet TAKE 1 TABLET BY MOUTH DAILY FOR GOUT PREVENTION 90 tablet 1   blood glucose meter kit and supplies KIT Dispense based on patient and insurance preference. Use to check blood sugar once daily. Dx: E11.22, N18.30 1 each 12   Cholecalciferol (VITAMIN D3) 5000 units CAPS Take 5,000 Units by mouth daily.     cyanocobalamin 1000 MCG tablet Take 1,000 mcg by mouth daily.     ezetimibe (ZETIA) 10 MG tablet Patient knows to take by mouth  !   - Thanks 90 tablet 3   gabapentin (NEURONTIN) 300 MG capsule Take 1 capsule in the morning and afternoon and 2 capsules at night 360 capsule 3   glimepiride (AMARYL) 1 MG tablet Take 1 tab by mouth in the morning if fasting is running 150+. Please do not take this  medication if you are ill or not eating as this can cause a low blood sugar. 90 tablet 1   glucose blood (FREESTYLE TEST STRIPS) test strip Check Blood Sugar Daily as directed (Dx: E11.29) 100 strip 3   hydrochlorothiazide (HYDRODIURIL) 25 MG tablet TAKE ONE TABLET BY MOUTH DAILY FOR FOR BLOOD PRESSURE AND FLUID RETENTION/ ANKLE SWELLING 90 tablet 1   ketoconazole (NIZORAL) 2 % cream Apply 1 application topically 2 (two) times daily. To groin rash (Patient not taking: Reported on 06/11/2021) 60 g 0   Lancets (FREESTYLE) lancets CHECK BLOOD SUGAR DAILY AS DIRECTED 100 each 3   lidocaine-prilocaine (EMLA) cream Apply to affected area once (Patient taking differently: Apply 1 application topically daily as needed (port access).) 30 g 3   loratadine (CLARITIN) 10 MG tablet Take 10 mg by mouth daily as needed for allergies.     Magnesium 250 MG TABS Take 250 mg by mouth every other day.     metoprolol succinate (TOPROL-XL) 25 MG 24 hr tablet TAKE 1 TABLET BY MOUTH DAILY FOR BLOOD PRESSURE (Patient taking differently: Take 25 mg by mouth daily.) 90 tablet 3   olaparib (LYNPARZA) 100 MG tablet Take 1 tablet (100 mg total) by mouth 3 (three) times a week. Take 1 tab by mouth on Mondays, Wednesdays and Fridays only     omeprazole (PRILOSEC) 20 MG capsule Take 1 capsule (20 mg total) by mouth daily. 30 capsule 11   ondansetron (ZOFRAN) 8 MG tablet Take 1 tablet (8 mg total) by mouth 2 (two) times daily as needed for refractory nausea / vomiting. Start on day 3 after carboplatin chemo. 30 tablet 1   prochlorperazine (COMPAZINE) 10 MG tablet Take 1 tablet (10 mg total) by mouth every 6 (six) hours as needed for nausea or vomiting. (Patient not taking: Reported on 06/11/2021) 60 tablet 9   rosuvastatin (CRESTOR) 5 MG tablet Take 1 tablet (5 mg total) by mouth 3 (three) times a week. In the evening for cholesterol. 36 tablet 3   vitamin C (ASCORBIC ACID) 500 MG tablet Take 500 mg by mouth every evening.      No  current facility-administered medications for this visit.    PHYSICAL EXAMINATION: ECOG PERFORMANCE STATUS: 1 - Symptomatic but completely ambulatory  LABORATORY DATA:  I have reviewed the data as listed CMP Latest Ref Rng & Units 06/27/2021 06/13/2021 05/30/2021  Glucose 70 - 99 mg/dL 147(H) 184(H) 183(H)  BUN 8 - 23 mg/dL 24(H) 24(H) 28(H)  Creatinine 0.44 - 1.00 mg/dL 1.62(H) 1.82(H) 1.62(H)  Sodium 135 - 145 mmol/L 142 141 141  Potassium 3.5 - 5.1 mmol/L 3.9 4.2  3.7  Chloride 98 - 111 mmol/L 105 102 106  CO2 22 - 32 mmol/L 29 29 27   Calcium 8.9 - 10.3 mg/dL 10.2 10.0 9.6  Total Protein 6.5 - 8.1 g/dL 6.8 7.0 6.6  Total Bilirubin 0.3 - 1.2 mg/dL 0.6 0.6 0.5  Alkaline Phos 38 - 126 U/L 108 117 114  AST 15 - 41 U/L 39 53(H) 40  ALT 0 - 44 U/L 27 35 25    Lab Results  Component Value Date   WBC 5.3 06/27/2021   HGB 11.1 (L) 06/27/2021   HCT 33.2 (L) 06/27/2021   MCV 92.7 06/27/2021   PLT 110 (L) 06/27/2021   NEUTROABS 3.3 06/27/2021     RADIOGRAPHIC STUDIES: I have reviewed prior CT imaging I have personally reviewed the radiological images as listed and agreed with the findings in the report.   I discussed the assessment and treatment plan with the patient. The patient was provided an opportunity to ask questions and all were answered. The patient agreed with the plan and demonstrated an understanding of the instructions. The patient was advised to call back or seek an in-person evaluation if the symptoms worsen or if the condition fails to improve as anticipated.    I spent 20 minutes for the appointment reviewing test results, discuss management and coordination of care.  Heath Lark, MD 06/27/2021 1:55 PM

## 2021-06-27 NOTE — Assessment & Plan Note (Signed)
She tolerated reduced dose 3 times a week better She will continue the same

## 2021-06-27 NOTE — Assessment & Plan Note (Signed)
Her chronic renal failure is stable I reminded the patient adequate hydration

## 2021-06-27 NOTE — Assessment & Plan Note (Signed)
I have reviewed test results with her I reviewed her previous CT imaging to address her question regarding the right lower base lung nodule Overall, she is responding well to treatment I recommend spacing out her lab draw to once a month I plan to repeat imaging study again in a few months Will continue reduced dose olaparib 100 mg 3 times a week

## 2021-06-27 NOTE — Assessment & Plan Note (Signed)
I have reviewed multiple CT imaging She has lung nodule on the right lung base, smaller compared to previous exam The cause is unknown She had 2 different types of malignancies but with improvement of the size meaning, lung nodule smaller, it could represent malignant disease that has responded to treatment In any case, the plan will be to observe with serial imaging studies

## 2021-07-09 ENCOUNTER — Ambulatory Visit: Payer: PPO | Admitting: Pharmacist

## 2021-07-19 ENCOUNTER — Other Ambulatory Visit: Payer: Self-pay | Admitting: Adult Health

## 2021-07-19 DIAGNOSIS — E1122 Type 2 diabetes mellitus with diabetic chronic kidney disease: Secondary | ICD-10-CM

## 2021-07-25 ENCOUNTER — Telehealth: Payer: PPO | Admitting: Hematology and Oncology

## 2021-07-25 ENCOUNTER — Other Ambulatory Visit: Payer: PPO

## 2021-07-26 ENCOUNTER — Telehealth: Payer: Self-pay

## 2021-07-26 ENCOUNTER — Other Ambulatory Visit: Payer: Self-pay

## 2021-07-26 ENCOUNTER — Encounter: Payer: Self-pay | Admitting: Hematology and Oncology

## 2021-07-26 ENCOUNTER — Inpatient Hospital Stay: Payer: PPO | Attending: Gynecologic Oncology

## 2021-07-26 ENCOUNTER — Inpatient Hospital Stay: Payer: PPO | Admitting: Hematology and Oncology

## 2021-07-26 DIAGNOSIS — Z170 Estrogen receptor positive status [ER+]: Secondary | ICD-10-CM

## 2021-07-26 DIAGNOSIS — R911 Solitary pulmonary nodule: Secondary | ICD-10-CM | POA: Diagnosis not present

## 2021-07-26 DIAGNOSIS — C561 Malignant neoplasm of right ovary: Secondary | ICD-10-CM | POA: Diagnosis not present

## 2021-07-26 DIAGNOSIS — R918 Other nonspecific abnormal finding of lung field: Secondary | ICD-10-CM | POA: Insufficient documentation

## 2021-07-26 DIAGNOSIS — E1122 Type 2 diabetes mellitus with diabetic chronic kidney disease: Secondary | ICD-10-CM | POA: Diagnosis not present

## 2021-07-26 DIAGNOSIS — Z79899 Other long term (current) drug therapy: Secondary | ICD-10-CM | POA: Diagnosis not present

## 2021-07-26 DIAGNOSIS — C569 Malignant neoplasm of unspecified ovary: Secondary | ICD-10-CM

## 2021-07-26 DIAGNOSIS — E1140 Type 2 diabetes mellitus with diabetic neuropathy, unspecified: Secondary | ICD-10-CM | POA: Diagnosis not present

## 2021-07-26 DIAGNOSIS — K011 Impacted teeth: Secondary | ICD-10-CM | POA: Diagnosis not present

## 2021-07-26 DIAGNOSIS — C50212 Malignant neoplasm of upper-inner quadrant of left female breast: Secondary | ICD-10-CM | POA: Diagnosis not present

## 2021-07-26 DIAGNOSIS — K089 Disorder of teeth and supporting structures, unspecified: Secondary | ICD-10-CM

## 2021-07-26 DIAGNOSIS — N183 Chronic kidney disease, stage 3 unspecified: Secondary | ICD-10-CM | POA: Diagnosis not present

## 2021-07-26 DIAGNOSIS — D61818 Other pancytopenia: Secondary | ICD-10-CM

## 2021-07-26 LAB — COMPREHENSIVE METABOLIC PANEL
ALT: 23 U/L (ref 0–44)
AST: 32 U/L (ref 15–41)
Albumin: 3.8 g/dL (ref 3.5–5.0)
Alkaline Phosphatase: 120 U/L (ref 38–126)
Anion gap: 12 (ref 5–15)
BUN: 23 mg/dL (ref 8–23)
CO2: 26 mmol/L (ref 22–32)
Calcium: 10.2 mg/dL (ref 8.9–10.3)
Chloride: 104 mmol/L (ref 98–111)
Creatinine, Ser: 1.7 mg/dL — ABNORMAL HIGH (ref 0.44–1.00)
GFR, Estimated: 30 mL/min — ABNORMAL LOW (ref >60)
Glucose, Bld: 112 mg/dL — ABNORMAL HIGH (ref 70–99)
Potassium: 4 mmol/L (ref 3.5–5.1)
Sodium: 142 mmol/L (ref 135–145)
Total Bilirubin: 0.6 mg/dL (ref 0.3–1.2)
Total Protein: 7 g/dL (ref 6.5–8.1)

## 2021-07-26 LAB — CBC WITH DIFFERENTIAL/PLATELET
Abs Immature Granulocytes: 0.02 10*3/uL (ref 0.00–0.07)
Basophils Absolute: 0 10*3/uL (ref 0.0–0.1)
Basophils Relative: 1 %
Eosinophils Absolute: 0.2 10*3/uL (ref 0.0–0.5)
Eosinophils Relative: 3 %
HCT: 34.6 % — ABNORMAL LOW (ref 36.0–46.0)
Hemoglobin: 11.5 g/dL — ABNORMAL LOW (ref 12.0–15.0)
Immature Granulocytes: 0 %
Lymphocytes Relative: 25 %
Lymphs Abs: 1.3 10*3/uL (ref 0.7–4.0)
MCH: 30.8 pg (ref 26.0–34.0)
MCHC: 33.2 g/dL (ref 30.0–36.0)
MCV: 92.8 fL (ref 80.0–100.0)
Monocytes Absolute: 0.5 10*3/uL (ref 0.1–1.0)
Monocytes Relative: 10 %
Neutro Abs: 3.3 10*3/uL (ref 1.7–7.7)
Neutrophils Relative %: 61 %
Platelets: 102 10*3/uL — ABNORMAL LOW (ref 150–400)
RBC: 3.73 MIL/uL — ABNORMAL LOW (ref 3.87–5.11)
RDW: 15.4 % (ref 11.5–15.5)
WBC: 5.3 10*3/uL (ref 4.0–10.5)
nRBC: 0 % (ref 0.0–0.2)

## 2021-07-26 NOTE — Assessment & Plan Note (Signed)
Her chronic renal failure is stable I reminded the patient adequate hydration

## 2021-07-26 NOTE — Assessment & Plan Note (Signed)
I have reviewed test results with her She tolerated reduced dose olaparib 100 mg 3 times a week very well without side effects Her recent tumor marker is stable I recommend we repeat CT imaging next month for further follow-up

## 2021-07-26 NOTE — Assessment & Plan Note (Addendum)
According to the patient, she has impacted wisdom tooth that needs to be extracted I received a letter from her dentist requesting clearance to proceed with dental extraction if needed Currently, she is not symptomatic I am hopeful we can at least wait until next month to assess response to therapy She is currently on very low-dose olaparib which I do not believe will impact on wound healing with dental extraction I have completed the paperwork and will fax the result back to her dentist to proceed if indicated She does not need to interrupt olaparib or prophylactic antibiotics

## 2021-07-26 NOTE — Assessment & Plan Note (Signed)
The patient has history of breast cancer and ovarian cancer Her last CT imaging show lower lung nodule Her tumor marker fluctuate up and down I recommend we proceed with repeat CT imaging of the chest, abdomen and pelvis next month for objective assessment of response to therapy She is in agreement

## 2021-07-26 NOTE — Telephone Encounter (Signed)
Faxed medical clearance form to [facility] at [phone], received confirmation.

## 2021-07-26 NOTE — Telephone Encounter (Signed)
Called and reviewed upcoming appts on 9/23 and 926. NPO 4 hours prior to CT, drink 1 st bottle of contrast at 7 am and 2 nd bottle at 8 am. She verbalized understanding.

## 2021-07-26 NOTE — Assessment & Plan Note (Signed)
She tolerated reduced dose 3 times a week better She will continue the same

## 2021-07-26 NOTE — Progress Notes (Signed)
HEMATOLOGY-ONCOLOGY ELECTRONIC VISIT PROGRESS NOTE  Patient Care Team: [name], MD as PCP - Kinnie Scales, OD as Referring Physician (Optometry) [name], MD as Consulting Physician (Dermatology) Latanya Maudlin, MD as Consulting Physician (Orthopedic Surgery) Inda Castle, MD (Inactive) as Consulting Physician (Gastroenterology) Jacqulyn Liner, RN as Oncology Nurse Navigator (Oncology) Heath Lark, MD as Consulting Physician (Hematology and Oncology) Madelon Lips, MD as Consulting Physician (Nephrology) Rush Landmark, [facility] as Pharmacist (Pharmacist)  I connected with the patient by telephone call for her virtual visit today  ASSESSMENT & PLAN:  Ovarian cancer [facility]) I have reviewed test results with her She tolerated reduced dose olaparib 100 mg 3 times a week very well without side effects Her recent tumor marker is stable I recommend we repeat CT imaging next month for further follow-up  Pancytopenia, acquired ([place]) She tolerated reduced dose 3 times a week better She will continue the same  CKD stage 3 due to type 2 diabetes mellitus ([place]) Her chronic renal failure is stable I reminded the patient adequate hydration  Nodule of lower lobe of right lung The patient has history of breast cancer and ovarian cancer Her last CT imaging show lower lung nodule Her tumor marker fluctuate up and down I recommend we proceed with repeat CT imaging of the chest, abdomen and pelvis next month for objective assessment of response to therapy She is in agreement  Poor dentition According to the patient, she has impacted wisdom tooth that needs to be extracted I received a letter from her dentist requesting clearance to proceed with dental extraction if needed Currently, she is not symptomatic I am hopeful we can at least wait until next month to assess response to therapy She is currently on very low-dose olaparib which I do not believe will impact on wound healing  with dental extraction I have completed the paperwork and will fax the result back to her dentist to proceed if indicated She does not need to interrupt olaparib or prophylactic antibiotics  Orders Placed This Encounter  Procedures   CT CHEST ABDOMEN PELVIS W CONTRAST    Standing Status:   Future    Standing Expiration Date:   07/26/2022    Order Specific Question:   Preferred imaging location?    Answer:   [facility]    Order Specific Question:   Radiology Contrast Protocol - do NOT remove file path    Answer:   [email or web address]    INTERVAL HISTORY: Please see below for problem oriented charting. The purpose of today's visit is to review toxicity and blood work She tolerated olaparib well without recent side effects No recent infection, fever or chills Denies abdominal pain, bloating or changes in bowel habits She was recently seen by dentist and was told there is an impacted wisdom tooth will need to be extracted in the near future I received paperwork requesting clearance to proceed  SUMMARY OF ONCOLOGIC HISTORY: Oncology History Overview Note  HRD positive   Breast cancer of upper-inner quadrant of left female breast ([place])  02/26/2016 Initial Diagnosis   Left breast biopsy 11:00 position: invasive ductal carcinoma with DCIS, ER 90%, PR 10%, HER-2 negative, Ki-67 30%, grade 2, 2.2 cm palpable lesion T2 N0 stage II a clinical stage   03/18/2016 Surgery   Left lumpectomy: Invasive ductal carcinoma, grade 2, 6.3 cm, with high-grade DCIS, margins negative, 0/4 lymph nodes negative, ER 90%, via 10%, HER-2 negative ratio 0.97, Ki-67 30%, T3 N0 stage IIB   03/25/2016  Procedure   Genetic testing is negative for pathogenic mutations within any of the 20 Genes on the breast/ovarian cancer panel   04/04/2016 Oncotype testing   Oncotype DX recurrence score 37, 25% 10 year distant risk of recurrence   04/17/2016 - 07/31/2016 Chemotherapy    Adjuvant chemotherapy with dose dense Adriamycin and Cytoxan followed by Abraxane weekly 8 ( discontinued due to neuropathy)   09/01/2016 - 09/26/2016 Radiation Therapy   Adj XRT 1) Left breast: 42.5 Gy in 17 fractions. 2) Left breast boost: 7.5 Gy in 3 fractions.   12/02/2016 -  Anti-estrogen oral therapy   Anastrozole 1 mg daily   Ovarian cancer ([place])  04/03/2020 Imaging   US pelvis Complex cystic and solid mass in LEFT adnexa 12.5 cm diameter question cystic ovarian neoplasm; recommend correlation with serum tumor markers and further evaluation by MR imaging with and without contrast.     04/04/2020 Imaging   US venous Doppler No evidence of deep venous thrombosis in the right lower extremity. Left common femoral vein also patent.   04/15/2020 Imaging   MRI pelvis 1. Large complex solid and cystic mass arising from the right adnexa measuring 10.8 by 11.5 by 11.1 cm. This has an aggressive appearance with extensive enhancing mural soft tissue components. Findings are highly suspicious for malignant ovarian neoplasm. 2. Extensive bilateral retroperitoneal and bilateral iliac adenopathy compatible with metastatic disease. 3. Signs of extensive peritoneal carcinomatosis including ascites, enhancement, thickening and nodularity of the peritoneal reflections, omental caking and bulky peritoneal nodularity. 4. Suspected serosal involvement of the dome of bladder with loss of normal fat plane. Cannot rule out mural invasion by tumor.   04/17/2020 Tumor Marker   Patient's tumor was tested for the following markers: CA-125 Results of the tumor marker test revealed 1762.   04/20/2020 Cancer Staging   Staging form: Ovary, Fallopian Tube, and Primary Peritoneal Carcinoma, AJCC 8th Edition - Clinical: FIGO Stage IIIC (cT3, cN1, cM0) - Signed by Heath Lark, MD on 04/20/2020   04/23/2020 Imaging   1. Redemonstrated dominant mixed solid and cystic mass arising from the vicinity of the right ovary  measuring at least 11.5 x 10.7 cm, not significantly changed compared to prior MR, and consistent with primary ovarian malignancy.  2. Numerous bulky retroperitoneal, bilateral iliac, and pelvic sidewall lymph nodes. 3. Moderate volume ascites throughout the abdomen and pelvis with subtle thickening and nodularity throughout the peritoneum, and extensive bulky nodular metastatic disease of the omentum. 4. Constellation of findings is consistent with advanced nodal and peritoneal metastatic disease. 5. There are prominent subcentimeter epicardial lymph nodes, nonspecific although suspicious for nodal metastatic disease. No definite nodal metastatic disease in the chest. Attention on follow-up. 6. There are multiple small subpleural nodules at the right lung base overlying the diaphragm, measuring up to 7 mm. These are generally nonspecific and less favored to represent pulmonary metastatic disease given distribution. Attention on follow-up. 7. Somewhat coarse contour of the liver, suggestive of cirrhosis, although out without overt morphologic stigmata. 8. Aortic Atherosclerosis (ICD10-I70.0).     04/24/2020 Procedure   Successful placement of a right internal jugular approach power injectable Port-A-Cath. The catheter is ready for immediate use.     05/02/2020 Tumor Marker   Patient's tumor was tested for the following markers: CA-125 Results of the tumor marker test revealed 1921   05/03/2020 - 12/12/2020 Chemotherapy   The patient had carboplatin and taxol for chemotherapy treatment.     05/08/2020 Procedure   Successful ultrasound-guided paracentesis yielding  2.6 liters of peritoneal fluid.   05/16/2020 Procedure   Successful ultrasound-guided paracentesis yielding 3.2 liters of peritoneal fluid   05/25/2020 Procedure   Successful ultrasound-guided paracentesis yielding 3 liters of peritoneal fluid.     06/01/2020 Procedure   Successful ultrasound-guided therapeutic paracentesis yielding 1.7  liters of peritoneal fluid.   06/14/2020 Tumor Marker   Patient's tumor was tested for the following markers: CA-125 Results of the tumor marker test revealed 1309   06/20/2020 Imaging   1. Dominant mixed cystic and solid mass in the central pelvis is stable in the interval. 2. Clear interval decrease in retroperitoneal and pelvic sidewall lymphadenopathy. The bulky omental disease has also clearly decreased in the interval. 3. Interval decrease in ascites. 4. Stable 8 mm subpleural nodule along the diaphragm. 5. Aortic Atherosclerosis (ICD10-I70.0).   07/10/2020 Tumor Marker   Patient's tumor was tested for the following markers: CA-125 Results of the tumor marker test revealed 220   08/03/2020 Tumor Marker   Patient's tumor was tested for the following markers: CA-125 Results of the tumor marker test revealed 53.3.   09/03/2020 Tumor Marker   Patient's tumor was tested for the following markers: CA-125 Results of the tumor marker test revealed 29.7   09/20/2020 Imaging   1. Substantial improvement. The right ovarian cystic and solid mass is half the volume that it measured on 06/20/2020. Similar reduction in the bulk of the omental caking of tumor. Prior ascites is resolved and the retroperitoneal adenopathy is markedly improved. 2. Other imaging findings of potential clinical significance: Stable pleural-based nodularity along the right hemidiaphragm measuring about 1.1 by 0.7 by 0.3 cm. Stable hypodense lesion of the right kidney upper pole, probably a cyst although the configuration of this lesion makes it difficult to obtain accurate density measurements. Left ovarian cyst, stable. Chondrocalcinosis involving the acetabular labra. Mild lumbar spondylosis and degenerative disc disease. 3. Aortic atherosclerosis.     10/18/2020 Surgery   Exploratory laparotomy, bilateral salpingo-oophorectomy, omentectomy, radical retroperitoneal dissection for tumor debulking   10/18/2020 Pathology  Results   A: Omentum, omentectomy - Metastatic high grade serous carcinoma, nodules measuring up to 2.7 cm (stage ypT3c), predominantly viable with focal fibrosis and hemosiderin laden macrophages (possible mild treatment effect)  B: Ovary and fallopian tube, right, salpingo-oophorectomy - High grade serous carcinoma involving right ovary and fallopian tube with surface involvement, size up to 8 cm  - Areas of necrosis and hemosiderin laden macrophages suggestive of treatment effect - 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 - See synoptic report and comment  C: Ovary and fallopian tube, left, salpingo-oophorectomy - High grade serous carcinoma involving the left fallopian tube, size up to 0.5 cm - Ovary with no definite involvement by carcinoma identified - Nodular area of endometriosis and peritoneal inclusion cyst also present - See synoptic report and comment   Procedure:    Bilateral salpingo-oophorectomy    Procedure:    Omentectomy    Specimen Integrity of Right Ovary:    Intact with surface involvement by tumor    Specimen Integrity of Left Ovary:    Capsule intact   TUMOR Tumor Site:    Right fallopian tube: favor as primary site; also involves right ovary and left fallopian tube  Histologic Type:    Serous carcinoma  Histologic Grade:    High grade  Tumor Size:    Greatest Dimension (Centimeters): in fallopian tube: 1.0 cm; in ovary: 8.0 cm Ovarian Surface Involvement:    Present  Laterality:    Right  Fallopian Tube Surface Involvement:    Present    Laterality:    Bilateral  Other Tissue / Organ Involvement:    Right ovary  Other Tissue / Organ Involvement:    Right fallopian tube  Other Tissue / Organ Involvement:    Left fallopian tube  Other Tissue / Organ Involvement:    Omentum  Largest Extrapelvic Peritoneal Focus:    Macroscopic (greater than 2 cm)  Peritoneal / Ascitic Fluid:    Not submitted / unknown  Pleural Fluid:     Not submitted / unknown  Treatment Effect:    No definite or minimal response identified (chemotherapy response score [CRS] 1)   LYMPH NODES Regional Lymph Nodes:    No lymph nodes submitted or found   PATHOLOGIC STAGE CLASSIFICATION (pTNM, AJCC 8th Edition) TNM Descriptors:    y (post-treatment)  Primary Tumor (pT):    pT3c  Regional Lymph Nodes (pN):    pNX   FIGO STAGE FIGO Stage:    IIIC   ADDITIONAL FINDINGS Additional Findings:    Serous tubal intraepithelial carcinoma (STIC)  Additional Findings:    Left ovary with no definite carcinoma identified, left-sided nodule of endometriosis and peritoneal inclusion cyst    10/18/2020 - 10/20/2020 Hospital Admission   She was admitted to [facility] for interval debulking surgery   11/19/2020 Tumor Marker   Patient's tumor was tested for the following markers: CA-125. Results of the tumor marker test revealed 38.3   12/12/2020 Tumor Marker   Patient's tumor was tested for the following markers CA-125. Results of the tumor marker test revealed 42.3.   01/02/2021 Tumor Marker   Patient's tumor was tested for the following markers: CA-125. Results of the tumor marker test revealed 37.3   01/29/2021 Tumor Marker   Patient's tumor was tested for the following markers: CA-125 Results of the tumor marker test revealed 44.3   01/29/2021 Imaging   1. Interval increase in loculated appearing ascites in the low central abdomen and pelvis. 2. There is extensive peritoneal nodularity surrounding this fluid, the nodularity itself not appreciably changed in appearance compared to prior examination. 3. Additional peritoneal nodularity and/or mesenteric lymph nodes are unchanged. 4. Interval decrease in size of a low-attenuation nodule overlying the right hemidiaphragm, now measuring 1.2 cm, previously 1.8 cm when measured similarly. Findings are consistent with treatment response of a metastatic nodule. No other evidence of intrathoracic metastatic  disease. 5. Status post hysterectomy, oophorectomy, and omentectomy.   01/31/2021 -  Chemotherapy   She is started on Olaparib       03/04/2021 Tumor Marker   Patient's tumor was tested for the following markers: CA-125 Results of the tumor marker test revealed 39.7   04/01/2021 Tumor Marker   Patient's tumor was tested for the following markers: CA-125 Results of the tumor marker test revealed 32.3   05/10/2021 Imaging   1. No significant change in peritoneal carcinomatosis since previous study of 3 months ago, primarily in the pelvis where there is complex fluid and peritoneal nodularity. 2. No evidence of solid visceral organ metastasis. No evidence of bowel or ureteral obstruction. 3.  Aortic Atherosclerosis (ICD10-I70.0).   06/13/2021 Tumor Marker   Patient's tumor was tested for the following markers: CA-125. Results of the tumor marker test revealed 48.3.     REVIEW OF SYSTEMS:   Constitutional: Denies fevers, chills or abnormal weight loss Eyes: Denies blurriness of vision Ears, nose, mouth, throat, and face: Denies mucositis  or sore throat Respiratory: Denies cough, dyspnea or wheezes Cardiovascular: Denies palpitation, chest discomfort Gastrointestinal:  Denies nausea, heartburn or change in bowel habits Skin: Denies abnormal skin rashes Lymphatics: Denies new lymphadenopathy or easy bruising Neurological:Denies numbness, tingling or new weaknesses Behavioral/Psych: Mood is stable, no new changes  Extremities: No lower extremity edema All other systems were reviewed with the patient and are negative.  I have reviewed the past medical history, past surgical history, social history and family history with the patient and they are unchanged from previous note.  ALLERGIES:  is allergic to keflex [cephalexin], meloxicam, prednisone, premarin [estrogens conjugated], and trovan [alatrofloxacin].  MEDICATIONS:  Current Outpatient Medications  Medication Sig Dispense Refill    acetaminophen (TYLENOL) 325 MG tablet Take 325 mg by mouth every 6 (six) hours as needed for headache.     allopurinol (ZYLOPRIM) 300 MG tablet TAKE 1 TABLET BY MOUTH DAILY FOR GOUT PREVENTION 90 tablet 1   blood glucose meter kit and supplies KIT Dispense based on patient and insurance preference. Use to check blood sugar once daily. Dx: E11.22, N18.30 1 each 12   Cholecalciferol (VITAMIN D3) 5000 units CAPS Take 5,000 Units by mouth daily.     cyanocobalamin 1000 MCG tablet Take 1,000 mcg by mouth daily.     ezetimibe (ZETIA) 10 MG tablet Patient knows to take by mouth  !   - Thanks 90 tablet 3   gabapentin (NEURONTIN) 300 MG capsule Take 1 capsule in the morning and afternoon and 2 capsules at night 360 capsule 3   glimepiride (AMARYL) 1 MG tablet Take 1 tab by mouth in the morning if fasting is running 150+. Please do not take this medication if you are ill or not eating as this can cause a low blood sugar. 90 tablet 1   glucose blood (FREESTYLE TEST STRIPS) test strip USE TO CHECK BLOOD GLUCOSE ONCE DAILY 100 strip 3   hydrochlorothiazide (HYDRODIURIL) 25 MG tablet TAKE ONE TABLET BY MOUTH DAILY FOR FOR BLOOD PRESSURE AND FLUID RETENTION/ ANKLE SWELLING 90 tablet 1   ketoconazole (NIZORAL) 2 % cream Apply 1 application topically 2 (two) times daily. To groin rash (Patient not taking: Reported on 06/11/2021) 60 g 0   Lancets (FREESTYLE) lancets USE TO CHECK BLOOD GLUCOSE ONCE DAILY 100 each 3   lidocaine-prilocaine (EMLA) cream Apply to affected area once (Patient taking differently: Apply 1 application topically daily as needed (port access).) 30 g 3   loratadine (CLARITIN) 10 MG tablet Take 10 mg by mouth daily as needed for allergies.     Magnesium 250 MG TABS Take 250 mg by mouth every other day.     metoprolol succinate (TOPROL-XL) 25 MG 24 hr tablet TAKE 1 TABLET BY MOUTH DAILY FOR BLOOD PRESSURE (Patient taking differently: Take 25 mg by mouth daily.) 90 tablet 3   olaparib (LYNPARZA)  100 MG tablet Take 1 tablet (100 mg total) by mouth 3 (three) times a week. Take 1 tab by mouth on Mondays, Wednesdays and Fridays only     omeprazole (PRILOSEC) 20 MG capsule Take 1 capsule (20 mg total) by mouth daily. 30 capsule 11   ondansetron (ZOFRAN) 8 MG tablet Take 1 tablet (8 mg total) by mouth 2 (two) times daily as needed for refractory nausea / vomiting. Start on day 3 after carboplatin chemo. 30 tablet 1   prochlorperazine (COMPAZINE) 10 MG tablet Take 1 tablet (10 mg total) by mouth every 6 (six) hours as needed for nausea or vomiting. (  Patient not taking: Reported on 06/11/2021) 60 tablet 9   rosuvastatin (CRESTOR) 5 MG tablet Take 1 tablet (5 mg total) by mouth 3 (three) times a week. In the evening for cholesterol. 36 tablet 3   vitamin C (ASCORBIC ACID) 500 MG tablet Take 500 mg by mouth every evening.      No current facility-administered medications for this visit.    PHYSICAL EXAMINATION: ECOG PERFORMANCE STATUS: 1 - Symptomatic but completely ambulatory  LABORATORY DATA:  I have reviewed the data as listed CMP Latest Ref Rng & Units 07/26/2021 06/27/2021 06/13/2021  Glucose 70 - 99 mg/dL 112(H) 147(H) 184(H)  BUN 8 - 23 mg/dL 23 24(H) 24(H)  Creatinine 0.44 - 1.00 mg/dL 1.70(H) 1.62(H) 1.82(H)  Sodium 135 - 145 mmol/L 142 142 141  Potassium 3.5 - 5.1 mmol/L 4.0 3.9 4.2  Chloride 98 - 111 mmol/L 104 105 102  CO2 22 - 32 mmol/L 26 29 29   Calcium 8.9 - 10.3 mg/dL 10.2 10.2 10.0  Total Protein 6.5 - 8.1 g/dL 7.0 6.8 7.0  Total Bilirubin 0.3 - 1.2 mg/dL 0.6 0.6 0.6  Alkaline Phos 38 - 126 U/L 120 108 117  AST 15 - 41 U/L 32 39 53(H)  ALT 0 - 44 U/L 23 27 35    Lab Results  Component Value Date   WBC 5.3 07/26/2021   HGB 11.5 (L) 07/26/2021   HCT 34.6 (L) 07/26/2021   MCV 92.8 07/26/2021   PLT 102 (L) 07/26/2021   NEUTROABS 3.3 07/26/2021    I discussed the assessment and treatment plan with the patient. The patient was provided an opportunity to ask questions  and all were answered. The patient agreed with the plan and demonstrated an understanding of the instructions. The patient was advised to call back or seek an in-person evaluation if the symptoms worsen or if the condition fails to improve as anticipated.    I spent 30 minutes for the appointment reviewing test results, discuss management and coordination of care.  Heath Lark, MD 07/26/2021 12:26 PM

## 2021-07-27 LAB — CA 125: Cancer Antigen (CA) 125: 54 U/mL — ABNORMAL HIGH (ref 0.0–38.1)

## 2021-08-21 DIAGNOSIS — E1122 Type 2 diabetes mellitus with diabetic chronic kidney disease: Secondary | ICD-10-CM | POA: Diagnosis not present

## 2021-08-21 DIAGNOSIS — I129 Hypertensive chronic kidney disease with stage 1 through stage 4 chronic kidney disease, or unspecified chronic kidney disease: Secondary | ICD-10-CM | POA: Diagnosis not present

## 2021-08-21 DIAGNOSIS — D61818 Other pancytopenia: Secondary | ICD-10-CM | POA: Diagnosis not present

## 2021-08-21 DIAGNOSIS — N1832 Chronic kidney disease, stage 3b: Secondary | ICD-10-CM | POA: Diagnosis not present

## 2021-08-21 DIAGNOSIS — C569 Malignant neoplasm of unspecified ovary: Secondary | ICD-10-CM | POA: Diagnosis not present

## 2021-08-21 DIAGNOSIS — Z23 Encounter for immunization: Secondary | ICD-10-CM | POA: Diagnosis not present

## 2021-08-23 ENCOUNTER — Inpatient Hospital Stay: Payer: PPO | Attending: Gynecologic Oncology

## 2021-08-23 ENCOUNTER — Encounter (HOSPITAL_COMMUNITY): Payer: Self-pay

## 2021-08-23 ENCOUNTER — Ambulatory Visit (HOSPITAL_COMMUNITY)
Admission: RE | Admit: 2021-08-23 | Discharge: 2021-08-23 | Disposition: A | Discharge status: Home / Self Care | Payer: PPO | Source: Ambulatory Visit | Attending: Hematology and Oncology | Admitting: Hematology and Oncology

## 2021-08-23 ENCOUNTER — Other Ambulatory Visit: Payer: Self-pay

## 2021-08-23 DIAGNOSIS — C561 Malignant neoplasm of right ovary: Secondary | ICD-10-CM | POA: Diagnosis not present

## 2021-08-23 DIAGNOSIS — Z95828 Presence of other vascular implants and grafts: Secondary | ICD-10-CM

## 2021-08-23 DIAGNOSIS — C50212 Malignant neoplasm of upper-inner quadrant of left female breast: Secondary | ICD-10-CM | POA: Insufficient documentation

## 2021-08-23 DIAGNOSIS — Z9079 Acquired absence of other genital organ(s): Secondary | ICD-10-CM | POA: Diagnosis not present

## 2021-08-23 DIAGNOSIS — K089 Disorder of teeth and supporting structures, unspecified: Secondary | ICD-10-CM | POA: Diagnosis not present

## 2021-08-23 DIAGNOSIS — I2510 Atherosclerotic heart disease of native coronary artery without angina pectoris: Secondary | ICD-10-CM | POA: Diagnosis not present

## 2021-08-23 DIAGNOSIS — Z853 Personal history of malignant neoplasm of breast: Secondary | ICD-10-CM | POA: Diagnosis not present

## 2021-08-23 DIAGNOSIS — Z90722 Acquired absence of ovaries, bilateral: Secondary | ICD-10-CM | POA: Diagnosis not present

## 2021-08-23 DIAGNOSIS — J701 Chronic and other pulmonary manifestations due to radiation: Secondary | ICD-10-CM | POA: Diagnosis not present

## 2021-08-23 DIAGNOSIS — Z90710 Acquired absence of both cervix and uterus: Secondary | ICD-10-CM | POA: Diagnosis not present

## 2021-08-23 DIAGNOSIS — C569 Malignant neoplasm of unspecified ovary: Secondary | ICD-10-CM

## 2021-08-23 DIAGNOSIS — Z170 Estrogen receptor positive status [ER+]: Secondary | ICD-10-CM | POA: Insufficient documentation

## 2021-08-23 DIAGNOSIS — Z9221 Personal history of antineoplastic chemotherapy: Secondary | ICD-10-CM | POA: Insufficient documentation

## 2021-08-23 DIAGNOSIS — J941 Fibrothorax: Secondary | ICD-10-CM | POA: Diagnosis not present

## 2021-08-23 DIAGNOSIS — R188 Other ascites: Secondary | ICD-10-CM | POA: Diagnosis not present

## 2021-08-23 LAB — COMPREHENSIVE METABOLIC PANEL
ALT: 21 U/L (ref 0–44)
AST: 39 U/L (ref 15–41)
Albumin: 3.8 g/dL (ref 3.5–5.0)
Alkaline Phosphatase: 118 U/L (ref 38–126)
Anion gap: 12 (ref 5–15)
BUN: 23 mg/dL (ref 8–23)
CO2: 25 mmol/L (ref 22–32)
Calcium: 10.2 mg/dL (ref 8.9–10.3)
Chloride: 107 mmol/L (ref 98–111)
Creatinine, Ser: 1.61 mg/dL — ABNORMAL HIGH (ref 0.44–1.00)
GFR, Estimated: 32 mL/min — ABNORMAL LOW (ref >60)
Glucose, Bld: 131 mg/dL — ABNORMAL HIGH (ref 70–99)
Potassium: 3.8 mmol/L (ref 3.5–5.1)
Sodium: 144 mmol/L (ref 135–145)
Total Bilirubin: 0.7 mg/dL (ref 0.3–1.2)
Total Protein: 7.1 g/dL (ref 6.5–8.1)

## 2021-08-23 LAB — CBC WITH DIFFERENTIAL/PLATELET
Abs Immature Granulocytes: 0.01 10*3/uL (ref 0.00–0.07)
Basophils Absolute: 0 10*3/uL (ref 0.0–0.1)
Basophils Relative: 1 %
Eosinophils Absolute: 0.2 10*3/uL (ref 0.0–0.5)
Eosinophils Relative: 4 %
HCT: 33 % — ABNORMAL LOW (ref 36.0–46.0)
Hemoglobin: 10.9 g/dL — ABNORMAL LOW (ref 12.0–15.0)
Immature Granulocytes: 0 %
Lymphocytes Relative: 23 %
Lymphs Abs: 1.1 10*3/uL (ref 0.7–4.0)
MCH: 30.4 pg (ref 26.0–34.0)
MCHC: 33 g/dL (ref 30.0–36.0)
MCV: 91.9 fL (ref 80.0–100.0)
Monocytes Absolute: 0.4 10*3/uL (ref 0.1–1.0)
Monocytes Relative: 10 %
Neutro Abs: 2.9 10*3/uL (ref 1.7–7.7)
Neutrophils Relative %: 62 %
Platelets: 89 10*3/uL — ABNORMAL LOW (ref 150–400)
RBC: 3.59 MIL/uL — ABNORMAL LOW (ref 3.87–5.11)
RDW: 15.9 % — ABNORMAL HIGH (ref 11.5–15.5)
WBC: 4.6 10*3/uL (ref 4.0–10.5)
nRBC: 0 % (ref 0.0–0.2)

## 2021-08-23 MED ORDER — SODIUM CHLORIDE 0.9% FLUSH
10.0000 mL | Freq: Once | INTRAVENOUS | Status: AC
  Administered 2021-08-23: 10 mL

## 2021-08-23 MED ORDER — IOHEXOL 350 MG/ML SOLN
75.0000 mL | Freq: Once | INTRAVENOUS | Status: AC | PRN
  Administered 2021-08-23: 60 mL via INTRAVENOUS

## 2021-08-23 MED ORDER — HEPARIN SOD (PORK) LOCK FLUSH 100 UNIT/ML IV SOLN: 500.0000 [IU] | Freq: Once | INTRAVENOUS | Status: AC

## 2021-08-23 MED ORDER — HEPARIN SOD (PORK) LOCK FLUSH 100 UNIT/ML IV SOLN
INTRAVENOUS | Status: AC
  Administered 2021-08-23: 500 [IU] via INTRAVENOUS
  Filled 2021-08-23: qty 5

## 2021-08-24 LAB — CA 125: Cancer Antigen (CA) 125: 56 U/mL — ABNORMAL HIGH (ref 0.0–38.1)

## 2021-08-25 ENCOUNTER — Other Ambulatory Visit: Payer: Self-pay | Admitting: Adult Health

## 2021-08-25 DIAGNOSIS — I10 Essential (primary) hypertension: Secondary | ICD-10-CM

## 2021-08-26 ENCOUNTER — Encounter: Payer: Self-pay | Admitting: Hematology and Oncology

## 2021-08-26 ENCOUNTER — Telehealth: Payer: Self-pay | Admitting: *Deleted

## 2021-08-26 ENCOUNTER — Telehealth: Payer: Self-pay | Admitting: Oncology

## 2021-08-26 ENCOUNTER — Other Ambulatory Visit: Payer: Self-pay

## 2021-08-26 ENCOUNTER — Inpatient Hospital Stay: Payer: PPO | Admitting: Hematology and Oncology

## 2021-08-26 DIAGNOSIS — D61818 Other pancytopenia: Secondary | ICD-10-CM | POA: Diagnosis not present

## 2021-08-26 DIAGNOSIS — E1122 Type 2 diabetes mellitus with diabetic chronic kidney disease: Secondary | ICD-10-CM | POA: Diagnosis not present

## 2021-08-26 DIAGNOSIS — N183 Chronic kidney disease, stage 3 unspecified: Secondary | ICD-10-CM | POA: Diagnosis not present

## 2021-08-26 DIAGNOSIS — C569 Malignant neoplasm of unspecified ovary: Secondary | ICD-10-CM

## 2021-08-26 DIAGNOSIS — Z7189 Other specified counseling: Secondary | ICD-10-CM

## 2021-08-26 DIAGNOSIS — C561 Malignant neoplasm of right ovary: Secondary | ICD-10-CM | POA: Diagnosis not present

## 2021-08-26 NOTE — Assessment & Plan Note (Signed)
Her recent pancytopenia is due to olaparib Recommend discontinuation of treatment

## 2021-08-26 NOTE — Telephone Encounter (Signed)
Fax referral form and records to [facility]

## 2021-08-26 NOTE — Progress Notes (Signed)
[place] OFFICE PROGRESS NOTE  Patient Care Team: [name], MD as PCP - Kinnie Scales, OD as Referring Physician (Optometry) [name], MD as Consulting Physician (Dermatology) Latanya Maudlin, MD as Consulting Physician (Orthopedic Surgery) Inda Castle, MD (Inactive) as Consulting Physician (Gastroenterology) Jacqulyn Liner, RN as Oncology Nurse Navigator (Oncology) Heath Lark, MD as Consulting Physician (Hematology and Oncology) Madelon Lips, MD as Consulting Physician (Nephrology) Rush Landmark, [facility] as Pharmacist (Pharmacist)  ASSESSMENT & PLAN:  Ovarian cancer [facility]) I have reviewed blood work and CT imaging with the patient and her husband Unfortunately, the patient have disease progression She is relatively asymptomatic at this point We discussed the role of palliative chemotherapy Previously, she did not tolerate combination chemotherapy of carboplatin and paclitaxel despite significant dose adjustment I am also concerned about risk of worsening neuropathy if we were to reattempt treatment with carboplatin and paclitaxel The patient is wondering about the role of secondary surgery The patient have residual disease after surgery and repeat surgery is not indicated and in appropriate We discussed the risk of poor wound healing, infection and poor outcome in her situation Ultimately, the patient would like second opinion with Dr. Berline Lopes which we will arrange We reviewed the guidelines We discussed the risk, benefits, side effects of combination of carboplatin with Doxil or carboplatin with gemcitabine We will touch base again after her meeting with Dr. Berline Lopes to determine the next step  Goals of care, counseling/discussion We have extensive discussions about goals of care The patient has incurable disease and with disease relapse soon after surgery and discontinuation of chemotherapy We discussed the palliative nature of further  treatment We discussed the natural history of the disease and what to expect with progression We discussed the risk and benefits of palliative chemotherapy versus supportive care only The patient is very tearful Ultimately, she is undecided with the next step We will arrange for second opinion with Dr. Berline Lopes  CKD stage 3 due to type 2 diabetes mellitus ([place]) She has stable chronic kidney disease stage III Observe closely  Pancytopenia, acquired ([place]) Her recent pancytopenia is due to olaparib Recommend discontinuation of treatment  No orders of the defined types were placed in this encounter.   All questions were answered. The patient knows to call the clinic with any problems, questions or concerns. The total time spent in the appointment was 40 minutes encounter with patients including review of chart and various tests results, discussions about plan of care and coordination of care plan   Heath Lark, MD 08/26/2021 5:27 PM  INTERVAL HISTORY: Please see below for problem oriented charting. she returns for treatment follow-up on olaparib for recurrent ovarian cancer  Since last time I saw her, she feels okay Denies significant abdominal pain or changes in bowel habits She has occasional bloating  REVIEW OF SYSTEMS:   Constitutional: Denies fevers, chills or abnormal weight loss Eyes: Denies blurriness of vision Ears, nose, mouth, throat, and face: Denies mucositis or sore throat Respiratory: Denies cough, dyspnea or wheezes Cardiovascular: Denies palpitation, chest discomfort or lower extremity swelling Gastrointestinal:  Denies nausea, heartburn or change in bowel habits Skin: Denies abnormal skin rashes Lymphatics: Denies new lymphadenopathy or easy bruising Neurological:Denies numbness, tingling or new weaknesses Behavioral/Psych: Mood is stable, no new changes  All other systems were reviewed with the patient and are negative.  I have reviewed the past medical history,  past surgical history, social history and family history with the patient and they are  unchanged from previous note.  ALLERGIES:  is allergic to keflex [cephalexin], meloxicam, prednisone, premarin [estrogens conjugated], and trovan [alatrofloxacin].  MEDICATIONS:  Current Outpatient Medications  Medication Sig Dispense Refill   acetaminophen (TYLENOL) 325 MG tablet Take 325 mg by mouth every 6 (six) hours as needed for headache.     allopurinol (ZYLOPRIM) 300 MG tablet TAKE 1 TABLET BY MOUTH DAILY FOR GOUT PREVENTION 90 tablet 1   blood glucose meter kit and supplies KIT Dispense based on patient and insurance preference. Use to check blood sugar once daily. Dx: E11.22, N18.30 1 each 12   Cholecalciferol (VITAMIN D3) 5000 units CAPS Take 5,000 Units by mouth daily.     cyanocobalamin 1000 MCG tablet Take 1,000 mcg by mouth daily.     ezetimibe (ZETIA) 10 MG tablet Patient knows to take by mouth  !   - Thanks 90 tablet 3   gabapentin (NEURONTIN) 300 MG capsule Take 1 capsule in the morning and afternoon and 2 capsules at night 360 capsule 3   glimepiride (AMARYL) 1 MG tablet Take 1 tab by mouth in the morning if fasting is running 150+. Please do not take this medication if you are ill or not eating as this can cause a low blood sugar. 90 tablet 1   glucose blood (FREESTYLE TEST STRIPS) test strip USE TO CHECK BLOOD GLUCOSE ONCE DAILY 100 strip 3   hydrochlorothiazide (HYDRODIURIL) 25 MG tablet TAKE ONE TABLET BY MOUTH DAILY FOR FOR BLOOD PRESSURE AND FLUID RETENTION/ ANKLE SWELLING 90 tablet 1   Lancets (FREESTYLE) lancets USE TO CHECK BLOOD GLUCOSE ONCE DAILY 100 each 3   lidocaine-prilocaine (EMLA) cream Apply to affected area once (Patient taking differently: Apply 1 application topically daily as needed (port access).) 30 g 3   loratadine (CLARITIN) 10 MG tablet Take 10 mg by mouth daily as needed for allergies.     Magnesium 250 MG TABS Take 250 mg by mouth every other day.     metoprolol  succinate (TOPROL-XL) 25 MG 24 hr tablet Take  1 tablet  Daily  for BP       /TAKE 1 TABLET BY MOUTH DAILY FOR BLOOD PRESSURE 90 tablet 3   omeprazole (PRILOSEC) 20 MG capsule Take 1 capsule (20 mg total) by mouth daily. 30 capsule 11   ondansetron (ZOFRAN) 8 MG tablet Take 1 tablet (8 mg total) by mouth 2 (two) times daily as needed for refractory nausea / vomiting. Start on day 3 after carboplatin chemo. 30 tablet 1   prochlorperazine (COMPAZINE) 10 MG tablet Take 1 tablet (10 mg total) by mouth every 6 (six) hours as needed for nausea or vomiting. (Patient not taking: Reported on 06/11/2021) 60 tablet 9   rosuvastatin (CRESTOR) 5 MG tablet Take 1 tablet (5 mg total) by mouth 3 (three) times a week. In the evening for cholesterol. 36 tablet 3   vitamin C (ASCORBIC ACID) 500 MG tablet Take 500 mg by mouth every evening.      No current facility-administered medications for this visit.    SUMMARY OF ONCOLOGIC HISTORY: Oncology History Overview Note  HRD positive   Breast cancer of upper-inner quadrant of left female breast ([place])  02/26/2016 Initial Diagnosis   Left breast biopsy 11:00 position: invasive ductal carcinoma with DCIS, ER 90%, PR 10%, HER-2 negative, Ki-67 30%, grade 2, 2.2 cm palpable lesion T2 N0 stage II a clinical stage   03/18/2016 Surgery   Left lumpectomy: Invasive ductal carcinoma, grade 2, 6.3 cm,  with high-grade DCIS, margins negative, 0/4 lymph nodes negative, ER 90%, via 10%, HER-2 negative ratio 0.97, Ki-67 30%, T3 N0 stage IIB   03/25/2016 Procedure   Genetic testing is negative for pathogenic mutations within any of the 20 Genes on the breast/ovarian cancer panel   04/04/2016 Oncotype testing   Oncotype DX recurrence score 37, 25% 10 year distant risk of recurrence   04/17/2016 - 07/31/2016 Chemotherapy   Adjuvant chemotherapy with dose dense Adriamycin and Cytoxan followed by Abraxane weekly 8 ( discontinued due to neuropathy)   09/01/2016 - 09/26/2016 Radiation  Therapy   Adj XRT 1) Left breast: 42.5 Gy in 17 fractions. 2) Left breast boost: 7.5 Gy in 3 fractions.   12/02/2016 -  Anti-estrogen oral therapy   Anastrozole 1 mg daily   Ovarian cancer ([place])  04/03/2020 Imaging   US pelvis Complex cystic and solid mass in LEFT adnexa 12.5 cm diameter question cystic ovarian neoplasm; recommend correlation with serum tumor markers and further evaluation by MR imaging with and without contrast.     04/04/2020 Imaging   US venous Doppler No evidence of deep venous thrombosis in the right lower extremity. Left common femoral vein also patent.   04/15/2020 Imaging   MRI pelvis 1. Large complex solid and cystic mass arising from the right adnexa measuring 10.8 by 11.5 by 11.1 cm. This has an aggressive appearance with extensive enhancing mural soft tissue components. Findings are highly suspicious for malignant ovarian neoplasm. 2. Extensive bilateral retroperitoneal and bilateral iliac adenopathy compatible with metastatic disease. 3. Signs of extensive peritoneal carcinomatosis including ascites, enhancement, thickening and nodularity of the peritoneal reflections, omental caking and bulky peritoneal nodularity. 4. Suspected serosal involvement of the dome of bladder with loss of normal fat plane. Cannot rule out mural invasion by tumor.   04/17/2020 Tumor Marker   Patient's tumor was tested for the following markers: CA-125 Results of the tumor marker test revealed 1762.   04/20/2020 Cancer Staging   Staging form: Ovary, Fallopian Tube, and Primary Peritoneal Carcinoma, AJCC 8th Edition - Clinical: FIGO Stage IIIC (cT3, cN1, cM0) - Signed by Heath Lark, MD on 04/20/2020   04/23/2020 Imaging   1. Redemonstrated dominant mixed solid and cystic mass arising from the vicinity of the right ovary measuring at least 11.5 x 10.7 cm, not significantly changed compared to prior MR, and consistent with primary ovarian malignancy.  2. Numerous bulky retroperitoneal,  bilateral iliac, and pelvic sidewall lymph nodes. 3. Moderate volume ascites throughout the abdomen and pelvis with subtle thickening and nodularity throughout the peritoneum, and extensive bulky nodular metastatic disease of the omentum. 4. Constellation of findings is consistent with advanced nodal and peritoneal metastatic disease. 5. There are prominent subcentimeter epicardial lymph nodes, nonspecific although suspicious for nodal metastatic disease. No definite nodal metastatic disease in the chest. Attention on follow-up. 6. There are multiple small subpleural nodules at the right lung base overlying the diaphragm, measuring up to 7 mm. These are generally nonspecific and less favored to represent pulmonary metastatic disease given distribution. Attention on follow-up. 7. Somewhat coarse contour of the liver, suggestive of cirrhosis, although out without overt morphologic stigmata. 8. Aortic Atherosclerosis (ICD10-I70.0).     04/24/2020 Procedure   Successful placement of a right internal jugular approach power injectable Port-A-Cath. The catheter is ready for immediate use.     05/02/2020 Tumor Marker   Patient's tumor was tested for the following markers: CA-125 Results of the tumor marker test revealed 1921   05/03/2020 -  12/12/2020 Chemotherapy   The patient had carboplatin and taxol for chemotherapy treatment.     05/08/2020 Procedure   Successful ultrasound-guided paracentesis yielding 2.6 liters of peritoneal fluid.   05/16/2020 Procedure   Successful ultrasound-guided paracentesis yielding 3.2 liters of peritoneal fluid   05/25/2020 Procedure   Successful ultrasound-guided paracentesis yielding 3 liters of peritoneal fluid.     06/01/2020 Procedure   Successful ultrasound-guided therapeutic paracentesis yielding 1.7 liters of peritoneal fluid.   06/14/2020 Tumor Marker   Patient's tumor was tested for the following markers: CA-125 Results of the tumor marker test revealed 1309    06/20/2020 Imaging   1. Dominant mixed cystic and solid mass in the central pelvis is stable in the interval. 2. Clear interval decrease in retroperitoneal and pelvic sidewall lymphadenopathy. The bulky omental disease has also clearly decreased in the interval. 3. Interval decrease in ascites. 4. Stable 8 mm subpleural nodule along the diaphragm. 5. Aortic Atherosclerosis (ICD10-I70.0).   07/10/2020 Tumor Marker   Patient's tumor was tested for the following markers: CA-125 Results of the tumor marker test revealed 220   08/03/2020 Tumor Marker   Patient's tumor was tested for the following markers: CA-125 Results of the tumor marker test revealed 53.3.   09/03/2020 Tumor Marker   Patient's tumor was tested for the following markers: CA-125 Results of the tumor marker test revealed 29.7   09/20/2020 Imaging   1. Substantial improvement. The right ovarian cystic and solid mass is half the volume that it measured on 06/20/2020. Similar reduction in the bulk of the omental caking of tumor. Prior ascites is resolved and the retroperitoneal adenopathy is markedly improved. 2. Other imaging findings of potential clinical significance: Stable pleural-based nodularity along the right hemidiaphragm measuring about 1.1 by 0.7 by 0.3 cm. Stable hypodense lesion of the right kidney upper pole, probably a cyst although the configuration of this lesion makes it difficult to obtain accurate density measurements. Left ovarian cyst, stable. Chondrocalcinosis involving the acetabular labra. Mild lumbar spondylosis and degenerative disc disease. 3. Aortic atherosclerosis.     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 (stage ypT3c), predominantly viable with focal fibrosis and  hemosiderin laden macrophages (possible mild treatment effect)  B: Ovary and fallopian tube, right, salpingo-oophorectomy - High grade serous carcinoma involving right ovary and fallopian tube with surface involvement, size up to 8 cm  - Areas of necrosis and hemosiderin laden macrophages suggestive of treatment effect - 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 - See synoptic report and comment  C: Ovary and fallopian tube, left, salpingo-oophorectomy - High grade serous carcinoma involving the left fallopian tube, size up to 0.5 cm - Ovary with no definite involvement by carcinoma identified - Nodular area of endometriosis and peritoneal inclusion cyst also present - See synoptic report and comment   Procedure:    Bilateral salpingo-oophorectomy    Procedure:    Omentectomy    Specimen Integrity of Right Ovary:    Intact with surface involvement by tumor    Specimen Integrity of Left Ovary:    Capsule intact   TUMOR Tumor Site:    Right fallopian tube: favor as primary site; also involves right ovary and left fallopian tube  Histologic Type:    Serous carcinoma  Histologic Grade:    High grade  Tumor Size:    Greatest Dimension (Centimeters): in fallopian tube: 1.0 cm; in ovary: 8.0 cm Ovarian Surface Involvement:    Present    Laterality:    Right  Fallopian Tube Surface Involvement:    Present    Laterality:    Bilateral  Other Tissue / Organ Involvement:    Right ovary  Other Tissue / Organ Involvement:    Right fallopian tube  Other Tissue / Organ Involvement:    Left fallopian tube  Other Tissue / Organ Involvement:    Omentum  Largest Extrapelvic Peritoneal Focus:    Macroscopic (greater than 2 cm)  Peritoneal / Ascitic Fluid:    Not submitted / unknown  Pleural Fluid:    Not submitted / unknown  Treatment Effect:    No definite or minimal response identified (chemotherapy response score [CRS] 1)   LYMPH NODES Regional Lymph Nodes:     No lymph nodes submitted or found   PATHOLOGIC STAGE CLASSIFICATION (pTNM, AJCC 8th Edition) TNM Descriptors:    y (post-treatment)  Primary Tumor (pT):    pT3c  Regional Lymph Nodes (pN):    pNX   FIGO STAGE FIGO Stage:    IIIC   ADDITIONAL FINDINGS Additional Findings:    Serous tubal intraepithelial carcinoma (STIC)  Additional Findings:    Left ovary with no definite carcinoma identified, left-sided nodule of endometriosis and peritoneal inclusion cyst    10/18/2020 - 10/20/2020 Hospital Admission   She was admitted to [place] for interval debulking surgery   11/19/2020 Tumor Marker   Patient's tumor was tested for the following markers: CA-125. Results of the tumor marker test revealed 38.3   12/12/2020 Tumor Marker   Patient's tumor was tested for the following markers CA-125. Results of the tumor marker test revealed 42.3.   01/02/2021 Tumor Marker   Patient's tumor was tested for the following markers: CA-125. Results of the tumor marker test revealed 37.3   01/29/2021 Tumor Marker   Patient's tumor was tested for the following markers: CA-125 Results of the tumor marker test revealed 44.3   01/29/2021 Imaging   1. Interval increase in loculated appearing ascites in the low central abdomen and pelvis. 2. There is extensive peritoneal nodularity surrounding this fluid, the nodularity itself not appreciably changed in appearance compared to prior examination. 3. Additional peritoneal nodularity and/or mesenteric lymph nodes are unchanged. 4. Interval decrease in size of a low-attenuation nodule overlying the right hemidiaphragm, now measuring 1.2 cm, previously 1.8 cm when measured similarly. Findings are consistent with treatment response of a metastatic nodule. No other evidence of intrathoracic metastatic disease. 5. Status post hysterectomy, oophorectomy, and omentectomy.   01/31/2021 - 08/26/2021 Chemotherapy   She is started on Olaparib       03/04/2021 Tumor  Marker   Patient's tumor was tested for the following markers: CA-125 Results of the tumor marker test revealed 39.7   04/01/2021 Tumor Marker   Patient's tumor was tested for the following markers: CA-125 Results of the tumor marker test revealed 32.3   05/10/2021 Imaging   1. No significant change in peritoneal carcinomatosis since previous study of 3 months ago, primarily in the pelvis where there is complex fluid and peritoneal nodularity. 2. No evidence of solid visceral organ metastasis. No evidence of bowel or ureteral obstruction. 3.  Aortic Atherosclerosis (ICD10-I70.0).   06/13/2021 Tumor Marker   Patient's tumor was tested for the following markers: CA-125. Results of the tumor marker test revealed 48.3.   07/29/2021  Tumor Marker   Patient's tumor was tested for the following markers: CA-125. Results of the tumor marker test revealed 54.   08/25/2021 Imaging   1. There is extensive peritoneal soft tissue thickening and nodularity, particularly about the low right hemipelvis, which is similar to prior examination. Some small peritoneal nodules throughout the abdomen and pelvis are slightly enlarged, other nodules are unchanged. 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 2. No significant change in a subpleural nodule overlying the right hemidiaphragm. 3. Unchanged, loculated small volume pelvic ascites. 4. Status post hysterectomy, oophorectomy, and omentectomy. 5. Coronary artery disease.   Aortic Atherosclerosis (ICD10-I70.0).     08/26/2021 Tumor Marker   Patient's tumor was tested for the following markers: CA-125. Results of the tumor marker test revealed 56.     PHYSICAL EXAMINATION: ECOG PERFORMANCE STATUS: 1 - Symptomatic but completely ambulatory  Vitals:   08/26/21 1118  BP: (!) 142/67  Pulse: 86  Resp: 18  Temp: 97.8 F (36.6 C)  SpO2: 98%   Filed Weights   08/26/21 1118  Weight: 146 lb 3.2 oz (66.3 kg)     GENERAL:alert, no distress and comfortable NEURO: alert & oriented x 3 with fluent speech, no focal motor/sensory deficits  LABORATORY DATA:  I have reviewed the data as listed    Component Value Date/Time   NA 144 08/23/2021 0819   NA 141 11/06/2016 1102   K 3.8 08/23/2021 0819   K 3.9 11/06/2016 1102   CL 107 08/23/2021 0819   CO2 25 08/23/2021 0819   CO2 27 11/06/2016 1102   GLUCOSE 131 (H) 08/23/2021 0819   GLUCOSE 130 11/06/2016 1102   BUN 23 08/23/2021 0819   BUN 18.0 11/06/2016 1102   CREATININE 1.61 (H) 08/23/2021 0819   CREATININE 1.58 (H) 05/21/2021 0922   CREATININE 1.42 (H) 02/21/2021 1414   CREATININE 1.3 (H) 11/06/2016 1102   CALCIUM 10.2 08/23/2021 0819   CALCIUM 10.2 11/06/2016 1102   PROT 7.1 08/23/2021 0819   PROT 7.1 11/06/2016 1102   ALBUMIN 3.8 08/23/2021 0819   ALBUMIN 3.7 11/06/2016 1102   AST 39 08/23/2021 0819   AST 41 05/21/2021 0922   AST 52 (H) 11/06/2016 1102   ALT 21 08/23/2021 0819   ALT 37 05/21/2021 0922   ALT 58 (H) 11/06/2016 1102   ALKPHOS 118 08/23/2021 0819   ALKPHOS 159 (H) 11/06/2016 1102   BILITOT 0.7 08/23/2021 0819   BILITOT 0.5 05/21/2021 0922   BILITOT 0.53 11/06/2016 1102   GFRNONAA 32 (L) 08/23/2021 0819   GFRNONAA 32 (L) 05/21/2021 0922   GFRNONAA 34 (L) 02/21/2021 1414   GFRAA 40 (L) 02/21/2021 1414    No results found for: SPEP, UPEP  Lab Results  Component Value Date   WBC 4.6 08/23/2021   NEUTROABS 2.9 08/23/2021   HGB 10.9 (L) 08/23/2021   HCT 33.0 (L) 08/23/2021   MCV 91.9 08/23/2021   PLT 89 (L) 08/23/2021      Chemistry      Component Value Date/Time   NA 144 08/23/2021 0819   NA 141 11/06/2016 1102   K 3.8 08/23/2021 0819   K 3.9 11/06/2016 1102   CL 107 08/23/2021 0819   CO2 25 08/23/2021 0819   CO2 27 11/06/2016 1102   BUN 23 08/23/2021 0819   BUN 18.0 11/06/2016 1102   CREATININE 1.61 (H) 08/23/2021 0819   CREATININE 1.58 (H) 05/21/2021 0922   CREATININE 1.42 (H) 02/21/2021 1414  CREATININE 1.3 (H) 11/06/2016 1102      Component Value Date/Time   CALCIUM 10.2 08/23/2021 0819   CALCIUM 10.2 11/06/2016 1102   ALKPHOS 118 08/23/2021 0819   ALKPHOS 159 (H) 11/06/2016 1102   AST 39 08/23/2021 0819   AST 41 05/21/2021 0922   AST 52 (H) 11/06/2016 1102   ALT 21 08/23/2021 0819   ALT 37 05/21/2021 0922   ALT 58 (H) 11/06/2016 1102   BILITOT 0.7 08/23/2021 0819   BILITOT 0.5 05/21/2021 0922   BILITOT 0.53 11/06/2016 1102       RADIOGRAPHIC STUDIES: I have reviewed multiple imaging studies with the patient and husband I have personally reviewed the radiological images as listed and agreed with the findings in the report. CT CHEST ABDOMEN PELVIS W CONTRAST  Result Date: 08/25/2021 CLINICAL DATA:  Ovarian cancer, assess treatment response, chemotherapy and XRT complete, ongoing oral chemotherapy, additional history of left breast cancer EXAM: CT CHEST, ABDOMEN, AND PELVIS WITH CONTRAST TECHNIQUE: Multidetector CT imaging of the chest, abdomen and pelvis was performed following the standard protocol during bolus administration of intravenous contrast. CONTRAST:  67m OMNIPAQUE IOHEXOL 350 MG/ML SOLN, additional oral enteric contrast COMPARISON:  CT abdomen pelvis, 05/10/2021, CT chest, 01/29/2021 FINDINGS: CT CHEST FINDINGS Cardiovascular: Right chest port catheter. Aortic atherosclerosis. Normal heart size. Scattered left coronary artery calcifications. No pericardial effusion. Mediastinum/Nodes: No enlarged mediastinal, hilar, or axillary lymph nodes. Thyroid gland, trachea, and esophagus demonstrate no significant findings. Lungs/Pleura: Minimal subpleural radiation fibrosis of the anterior left upper lobe and lingula. No significant change in size of a subpleural nodule overlying the right hemidiaphragm, measuring 1.1 cm (series 5, image 75). Mild, predominantly dependent bibasilar scarring and or atelectasis. No pleural effusion or pneumothorax. Musculoskeletal: No chest wall  mass or suspicious bone lesions identified. Postoperative findings of left lumpectomy. CT ABDOMEN PELVIS FINDINGS Hepatobiliary: No solid liver abnormality is seen. No gallstones, gallbladder wall thickening, or biliary dilatation. Pancreas: Unremarkable. No pancreatic ductal dilatation or surrounding inflammatory changes. Spleen: Normal in size without significant abnormality. Adrenals/Urinary Tract: Adrenal glands are unremarkable. Kidneys are normal, without renal calculi, solid lesion, or hydronephrosis. Bladder is unremarkable. Stomach/Bowel: Stomach is within normal limits. Appendix appears normal. No evidence of bowel wall thickening, distention, or inflammatory changes. Status post omentectomy. Vascular/Lymphatic: Aortic atherosclerosis. No enlarged abdominal or pelvic lymph nodes. Reproductive: Status post hysterectomy and oophorectomy. Other: No abdominal wall hernia or abnormality. Redemonstrated small volume, loculated ascites in the pelvis. There is extensive peritoneal soft tissue thickening and nodularity, particularly about the low right hemipelvis, which is similar to prior examination, dominant component in the right hemipelvis measuring 3.8 x 3.2 cm, previously 3.5 x 3.2 cm (series 2, image 107). Some small peritoneal nodules throughout the abdomen and pelvis are slightly enlarged, for example in the left lower quadrant a nodule measuring 1.2 cm, previously 0.9 cm (series 2, image 93) and in the midline ventral abdomen a nodule measuring 1.3 cm, previously 0.8 cm (series 2, image 86). Other small nodules are unchanged, for example a 1.1 cm nodule in the lateral right abdomen (series 2, image 70). Musculoskeletal: No acute or significant osseous findings. IMPRESSION: 1. There is extensive peritoneal soft tissue thickening and nodularity, particularly about the low right hemipelvis, which is similar to prior examination. Some small peritoneal nodules throughout the abdomen and pelvis are slightly  enlarged, other nodules are unchanged. 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 2. No significant change in a subpleural nodule overlying the  right hemidiaphragm. 3. Unchanged, loculated small volume pelvic ascites. 4. Status post hysterectomy, oophorectomy, and omentectomy. 5. Coronary artery disease. Aortic Atherosclerosis (ICD10-I70.0). Electronically Signed   By: Eddie Candle M.D.   On: 08/25/2021 11:46

## 2021-08-26 NOTE — Assessment & Plan Note (Signed)
We have extensive discussions about goals of care The patient has incurable disease and with disease relapse soon after surgery and discontinuation of chemotherapy We discussed the palliative nature of further treatment We discussed the natural history of the disease and what to expect with progression We discussed the risk and benefits of palliative chemotherapy versus supportive care only The patient is very tearful Ultimately, she is undecided with the next step We will arrange for second opinion with Dr. Berline Lopes

## 2021-08-26 NOTE — Telephone Encounter (Signed)
Scheduled [place] to see Dr. Berline Lopes tomorrow at 3:00.

## 2021-08-26 NOTE — Telephone Encounter (Signed)
Left a message regarding appointment with Dr. Berline Lopes.  Requested a return call.

## 2021-08-26 NOTE — Assessment & Plan Note (Signed)
I have reviewed blood work and CT imaging with the patient and her husband Unfortunately, the patient have disease progression She is relatively asymptomatic at this point We discussed the role of palliative chemotherapy Previously, she did not tolerate combination chemotherapy of carboplatin and paclitaxel despite significant dose adjustment I am also concerned about risk of worsening neuropathy if we were to reattempt treatment with carboplatin and paclitaxel The patient is wondering about the role of secondary surgery The patient have residual disease after surgery and repeat surgery is not indicated and in appropriate We discussed the risk of poor wound healing, infection and poor outcome in her situation Ultimately, the patient would like second opinion with Dr. Berline Lopes which we will arrange We reviewed the guidelines We discussed the risk, benefits, side effects of combination of carboplatin with Doxil or carboplatin with gemcitabine We will touch base again after her meeting with Dr. Berline Lopes to determine the next step

## 2021-08-26 NOTE — Assessment & Plan Note (Signed)
She has stable chronic kidney disease stage III Observe closely

## 2021-08-27 ENCOUNTER — Inpatient Hospital Stay (HOSPITAL_BASED_OUTPATIENT_CLINIC_OR_DEPARTMENT_OTHER): Payer: PPO | Admitting: Gynecologic Oncology

## 2021-08-27 ENCOUNTER — Encounter: Payer: Self-pay | Admitting: Oncology

## 2021-08-27 ENCOUNTER — Other Ambulatory Visit: Payer: Self-pay

## 2021-08-27 ENCOUNTER — Encounter: Payer: Self-pay | Admitting: Gynecologic Oncology

## 2021-08-27 VITALS — BP 132/69 | HR 79 | Temp 98.0°F | Resp 16 | Ht 64.0 in | Wt 145.6 lb

## 2021-08-27 DIAGNOSIS — C561 Malignant neoplasm of right ovary: Secondary | ICD-10-CM | POA: Diagnosis not present

## 2021-08-27 DIAGNOSIS — C569 Malignant neoplasm of unspecified ovary: Secondary | ICD-10-CM

## 2021-08-27 NOTE — Patient Instructions (Signed)
It was good to see you today. As we discussed, given imaging findings related to your disease, surgery is not an option (would likely shorten your life). After discussing treatment options, you voiced wanting to proceed with starting chemotherapy. We will let Dr. [name] know so that treatment can be started soon.

## 2021-08-27 NOTE — Progress Notes (Signed)
Met with Kayla Baker and her husband.  They have decided on chemotherapy with carboplatin and gemzar.

## 2021-08-27 NOTE — Progress Notes (Signed)
Gynecologic Oncology Return Clinic Visit  08/27/2021  Reason for Visit: Follow-up visit to discuss findings of progressive disease on CT scan, treatment options  Treatment History: Oncology History Overview Note  HRD positive   Breast cancer of upper-inner quadrant of left female breast ([place])  02/26/2016 Initial Diagnosis   Left breast biopsy 11:00 position: invasive ductal carcinoma with DCIS, ER 90%, PR 10%, HER-2 negative, Ki-67 30%, grade 2, 2.2 cm palpable lesion T2 N0 stage II a clinical stage   03/18/2016 Surgery   Left lumpectomy: Invasive ductal carcinoma, grade 2, 6.3 cm, with high-grade DCIS, margins negative, 0/4 lymph nodes negative, ER 90%, via 10%, HER-2 negative ratio 0.97, Ki-67 30%, T3 N0 stage IIB   03/25/2016 Procedure   Genetic testing is negative for pathogenic mutations within any of the 20 Genes on the breast/ovarian cancer panel   04/04/2016 Oncotype testing   Oncotype DX recurrence score 37, 25% 10 year distant risk of recurrence   04/17/2016 - 07/31/2016 Chemotherapy   Adjuvant chemotherapy with dose dense Adriamycin and Cytoxan followed by Abraxane weekly 8 ( discontinued due to neuropathy)   09/01/2016 - 09/26/2016 Radiation Therapy   Adj XRT 1) Left breast: 42.5 Gy in 17 fractions. 2) Left breast boost: 7.5 Gy in 3 fractions.   12/02/2016 -  Anti-estrogen oral therapy   Anastrozole 1 mg daily   Ovarian cancer ([place])  04/03/2020 Imaging   US pelvis Complex cystic and solid mass in LEFT adnexa 12.5 cm diameter question cystic ovarian neoplasm; recommend correlation with serum tumor markers and further evaluation by MR imaging with and without contrast.     04/04/2020 Imaging   US venous Doppler No evidence of deep venous thrombosis in the right lower extremity. Left common femoral vein also patent.   04/15/2020 Imaging   MRI pelvis 1. Large complex solid and cystic mass arising from the right adnexa measuring 10.8 by 11.5 by 11.1 cm. This has an aggressive  appearance with extensive enhancing mural soft tissue components. Findings are highly suspicious for malignant ovarian neoplasm. 2. Extensive bilateral retroperitoneal and bilateral iliac adenopathy compatible with metastatic disease. 3. Signs of extensive peritoneal carcinomatosis including ascites, enhancement, thickening and nodularity of the peritoneal reflections, omental caking and bulky peritoneal nodularity. 4. Suspected serosal involvement of the dome of bladder with loss of normal fat plane. Cannot rule out mural invasion by tumor.   04/17/2020 Tumor Marker   Patient's tumor was tested for the following markers: CA-125 Results of the tumor marker test revealed 1762.   04/20/2020 Cancer Staging   Staging form: Ovary, Fallopian Tube, and Primary Peritoneal Carcinoma, AJCC 8th Edition - Clinical: FIGO Stage IIIC (cT3, cN1, cM0) - Signed by Heath Lark, MD on 04/20/2020   04/23/2020 Imaging   1. Redemonstrated dominant mixed solid and cystic mass arising from the vicinity of the right ovary measuring at least 11.5 x 10.7 cm, not significantly changed compared to prior MR, and consistent with primary ovarian malignancy.  2. Numerous bulky retroperitoneal, bilateral iliac, and pelvic sidewall lymph nodes. 3. Moderate volume ascites throughout the abdomen and pelvis with subtle thickening and nodularity throughout the peritoneum, and extensive bulky nodular metastatic disease of the omentum. 4. Constellation of findings is consistent with advanced nodal and peritoneal metastatic disease. 5. There are prominent subcentimeter epicardial lymph nodes, nonspecific although suspicious for nodal metastatic disease. No definite nodal metastatic disease in the chest. Attention on follow-up. 6. There are multiple small subpleural nodules at the right lung base overlying the diaphragm,  measuring up to 7 mm. These are generally nonspecific and less favored to represent pulmonary metastatic disease given  distribution. Attention on follow-up. 7. Somewhat coarse contour of the liver, suggestive of cirrhosis, although out without overt morphologic stigmata. 8. Aortic Atherosclerosis (ICD10-I70.0).     04/24/2020 Procedure   Successful placement of a right internal jugular approach power injectable Port-A-Cath. The catheter is ready for immediate use.     05/02/2020 Tumor Marker   Patient's tumor was tested for the following markers: CA-125 Results of the tumor marker test revealed 1921   05/03/2020 - 12/12/2020 Chemotherapy   The patient had carboplatin and taxol for chemotherapy treatment.     05/08/2020 Procedure   Successful ultrasound-guided paracentesis yielding 2.6 liters of peritoneal fluid.   05/16/2020 Procedure   Successful ultrasound-guided paracentesis yielding 3.2 liters of peritoneal fluid   05/25/2020 Procedure   Successful ultrasound-guided paracentesis yielding 3 liters of peritoneal fluid.     06/01/2020 Procedure   Successful ultrasound-guided therapeutic paracentesis yielding 1.7 liters of peritoneal fluid.   06/14/2020 Tumor Marker   Patient's tumor was tested for the following markers: CA-125 Results of the tumor marker test revealed 1309   06/20/2020 Imaging   1. Dominant mixed cystic and solid mass in the central pelvis is stable in the interval. 2. Clear interval decrease in retroperitoneal and pelvic sidewall lymphadenopathy. The bulky omental disease has also clearly decreased in the interval. 3. Interval decrease in ascites. 4. Stable 8 mm subpleural nodule along the diaphragm. 5. Aortic Atherosclerosis (ICD10-I70.0).   07/10/2020 Tumor Marker   Patient's tumor was tested for the following markers: CA-125 Results of the tumor marker test revealed 220   08/03/2020 Tumor Marker   Patient's tumor was tested for the following markers: CA-125 Results of the tumor marker test revealed 53.3.   09/03/2020 Tumor Marker   Patient's tumor was tested for the following  markers: CA-125 Results of the tumor marker test revealed 29.7   09/20/2020 Imaging   1. Substantial improvement. The right ovarian cystic and solid mass is half the volume that it measured on 06/20/2020. Similar reduction in the bulk of the omental caking of tumor. Prior ascites is resolved and the retroperitoneal adenopathy is markedly improved. 2. Other imaging findings of potential clinical significance: Stable pleural-based nodularity along the right hemidiaphragm measuring about 1.1 by 0.7 by 0.3 cm. Stable hypodense lesion of the right kidney upper pole, probably a cyst although the configuration of this lesion makes it difficult to obtain accurate density measurements. Left ovarian cyst, stable. Chondrocalcinosis involving the acetabular labra. Mild lumbar spondylosis and degenerative disc disease. 3. Aortic atherosclerosis.     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 (stage ypT3c), predominantly viable with focal fibrosis and hemosiderin laden macrophages (possible mild treatment effect)  B: Ovary and fallopian tube, right, salpingo-oophorectomy - High grade serous carcinoma involving right ovary and fallopian tube with surface involvement, size up to 8 cm  - Areas of necrosis and hemosiderin laden macrophages suggestive of treatment effect - 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 - See synoptic report and comment  C: Ovary and fallopian tube, left, salpingo-oophorectomy - High grade serous carcinoma involving the left fallopian tube, size up to 0.5 cm - Ovary with no definite involvement by carcinoma identified - Nodular area of  endometriosis and peritoneal inclusion cyst also present - See synoptic report  and comment   Procedure:    Bilateral salpingo-oophorectomy    Procedure:    Omentectomy    Specimen Integrity of Right Ovary:    Intact with surface involvement by tumor    Specimen Integrity of Left Ovary:    Capsule intact   TUMOR Tumor Site:    Right fallopian tube: favor as primary site; also involves right ovary and left fallopian tube  Histologic Type:    Serous carcinoma  Histologic Grade:    High grade  Tumor Size:    Greatest Dimension (Centimeters): in fallopian tube: 1.0 cm; in ovary: 8.0 cm Ovarian Surface Involvement:    Present    Laterality:    Right  Fallopian Tube Surface Involvement:    Present    Laterality:    Bilateral  Other Tissue / Organ Involvement:    Right ovary  Other Tissue / Organ Involvement:    Right fallopian tube  Other Tissue / Organ Involvement:    Left fallopian tube  Other Tissue / Organ Involvement:    Omentum  Largest Extrapelvic Peritoneal Focus:    Macroscopic (greater than 2 cm)  Peritoneal / Ascitic Fluid:    Not submitted / unknown  Pleural Fluid:    Not submitted / unknown  Treatment Effect:    No definite or minimal response identified (chemotherapy response score [CRS] 1)   LYMPH NODES Regional Lymph Nodes:    No lymph nodes submitted or found   PATHOLOGIC STAGE CLASSIFICATION (pTNM, AJCC 8th Edition) TNM Descriptors:    y (post-treatment)  Primary Tumor (pT):    pT3c  Regional Lymph Nodes (pN):    pNX   FIGO STAGE FIGO Stage:    IIIC   ADDITIONAL FINDINGS Additional Findings:    Serous tubal intraepithelial carcinoma (STIC)  Additional Findings:    Left ovary with no definite carcinoma identified, left-sided nodule of endometriosis and peritoneal inclusion cyst    10/18/2020 - 10/20/2020 Hospital Admission   She was admitted to [facility] for interval debulking surgery   11/19/2020 Tumor Marker   Patient's tumor was tested for the following markers: CA-125. Results of the tumor marker test revealed 38.3   12/12/2020  Tumor Marker   Patient's tumor was tested for the following markers CA-125. Results of the tumor marker test revealed 42.3.   01/02/2021 Tumor Marker   Patient's tumor was tested for the following markers: CA-125. Results of the tumor marker test revealed 37.3   01/29/2021 Tumor Marker   Patient's tumor was tested for the following markers: CA-125 Results of the tumor marker test revealed 44.3   01/29/2021 Imaging   1. Interval increase in loculated appearing ascites in the low central abdomen and pelvis. 2. There is extensive peritoneal nodularity surrounding this fluid, the nodularity itself not appreciably changed in appearance compared to prior examination. 3. Additional peritoneal nodularity and/or mesenteric lymph nodes are unchanged. 4. Interval decrease in size of a low-attenuation nodule overlying the right hemidiaphragm, now measuring 1.2 cm, previously 1.8 cm when measured similarly. Findings are consistent with treatment response of a metastatic nodule. No other evidence of intrathoracic metastatic disease. 5. Status post hysterectomy, oophorectomy, and omentectomy.   01/31/2021 - 08/26/2021 Chemotherapy   She is started on Olaparib       03/04/2021 Tumor Marker   Patient's tumor was tested for the following markers: CA-125 Results of the tumor marker test revealed 39.7   04/01/2021 Tumor  Marker   Patient's tumor was tested for the following markers: CA-125 Results of the tumor marker test revealed 32.3   05/10/2021 Imaging   1. No significant change in peritoneal carcinomatosis since previous study of 3 months ago, primarily in the pelvis where there is complex fluid and peritoneal nodularity. 2. No evidence of solid visceral organ metastasis. No evidence of bowel or ureteral obstruction. 3.  Aortic Atherosclerosis (ICD10-I70.0).   06/13/2021 Tumor Marker   Patient's tumor was tested for the following markers: CA-125. Results of the tumor marker test revealed 48.3.   07/29/2021  Tumor Marker   Patient's tumor was tested for the following markers: CA-125. Results of the tumor marker test revealed 54.   08/25/2021 Imaging   1. There is extensive peritoneal soft tissue thickening and nodularity, particularly about the low right hemipelvis, which is similar to prior examination. Some small peritoneal nodules throughout the abdomen and pelvis are slightly enlarged, other nodules are unchanged. 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 2. No significant change in a subpleural nodule overlying the right hemidiaphragm. 3. Unchanged, loculated small volume pelvic ascites. 4. Status post hysterectomy, oophorectomy, and omentectomy. 5. Coronary artery disease.   Aortic Atherosclerosis (ICD10-I70.0).     08/26/2021 Tumor Marker   Patient's tumor was tested for the following markers: CA-125. Results of the tumor marker test revealed 56.     Interval History: The patient presents today with her husband for discussion regarding treatment options.  She notes overall doing very well.  She reports good appetite without nausea or emesis.  She denies any recent weight loss or weight gain.  She denies any abdominal or pelvic pain.  She reports regular bowel and bladder function.  She denies any vaginal bleeding or discharge.  Past Medical/Surgical History: 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[place]) 02/29/2016   skin- 2016- squamous- on right upper arm   GERD (gastroesophageal reflux disease)    Gout    takes Allopurinol daily   History of chemotherapy    History of colon polyps    benign   History of shingles    Hx of radiation therapy    Hyperlipidemia    Hypertension    Ovarian ca ([place]) dx'd 03/2020   Personal history of chemotherapy    2017   Personal history of radiation therapy     2017   Vitamin D deficiency     Past Surgical History:  Procedure Laterality Date   ABDOMINAL HYSTERECTOMY  1973   BREAST BIOPSY Left 02/26/2016   BREAST LUMPECTOMY Left 03/18/2016   CARDIAC CATHETERIZATION     patient denies this procedure   CATARACT EXTRACTION, BILATERAL  2014   right eye 2/14; left eye 3/3   COLONOSCOPY     HAND SURGERY Left 2012   INCONTINENCE SURGERY  2006   IR IMAGING GUIDED PORT INSERTION  04/24/2020   IR PARACENTESIS  05/08/2020   IR PARACENTESIS  05/16/2020   IR PARACENTESIS  05/25/2020   KNEE ARTHROSCOPY Right    MASTOPEXY Right 06/16/2017   Procedure: RIGHT BREAST MASTOPEXY;  Surgeon: Irene Limbo, MD;  Location: [facility];  Service: Plastics;  Laterality: Right;   PORT-A-CATH REMOVAL N/A 11/27/2016   Procedure: REMOVAL PORT-A-CATH;  Surgeon: Fanny Skates, MD;  Location: [facility];  Service:  General;  Laterality: N/A;   PORTACATH PLACEMENT N/A 04/14/2016   Procedure: INSERTION PORT-A-CATH ;  Surgeon: Fanny Skates, MD;  Location: [facility];  Service: General;  Laterality: N/A;   RADIOACTIVE SEED GUIDED PARTIAL MASTECTOMY WITH AXILLARY SENTINEL LYMPH NODE BIOPSY Left 03/18/2016   Procedure: RADIOACTIVE SEED GUIDED LEFT PARTIAL MASTECTOMY WITH AXILLARY SENTINEL LYMPH NODE BIOPSY AND BLUE DYE INJECTION;  Surgeon: Fanny Skates, MD;  Location: [facility];  Service: General;  Laterality: Left;   REDUCTION MAMMAPLASTY Right 2018    Family History  Problem Relation Age of Onset   Stroke Mother 65   Other Mother        history of hysterectomy after last childbirth   Diabetes Father    Alzheimer's disease Father    Hypertension Sister    Lung cancer Sister 60       smoker   Other Sister        history of hysterectomy for fibroids   Breast cancer Sister 22   Esophageal cancer Maternal Aunt 74       not a smoker   Heart attack Maternal Uncle 32   Cirrhosis Sister    Lung cancer Other        nephew dx. 61s; +smoker   Epilepsy Other    Other  Other 5       great niece dx. benign ganglioglioma brain tumor; treated at [facility]   Epilepsy Other        no seizures in 2 years   Parkinson's disease Maternal Aunt    Stroke Maternal Grandmother    Heart Problems Maternal Grandmother    Pancreatic cancer Cousin 10       paternal 1st cousin   Colon cancer Neg Hx    Stomach cancer Neg Hx    Colon polyps Neg Hx    Ulcerative colitis Neg Hx     Social History   Socioeconomic History   Marital status: Married    Spouse name: [name]   Number of children: 0   Years of education: Not on file   Highest education level: Not on file  Occupational History   Not on file  Tobacco Use   Smoking status: Never   Smokeless tobacco: Never  Vaping Use   Vaping Use: Never used  Substance and Sexual Activity   Alcohol use: No   Drug use: No   Sexual activity: Not on file  Other Topics Concern   Not on file  Social History Narrative   Not on file   Social Determinants of Health   Financial Resource Strain: Not on file  Food Insecurity: Not on file  Transportation Needs: Not on file  Physical Activity: Not on file  Stress: Not on file  Social Connections: Not on file    Current Medications:  Current Outpatient Medications:    acetaminophen (TYLENOL) 325 MG tablet, Take 325 mg by mouth every 6 (six) hours as needed for headache., Disp: , Rfl:    allopurinol (ZYLOPRIM) 300 MG tablet, TAKE 1 TABLET BY MOUTH DAILY FOR GOUT PREVENTION, Disp: 90 tablet, Rfl: 1   blood glucose meter kit and supplies KIT, Dispense based on patient and insurance preference. Use to check blood sugar once daily. Dx: E11.22, N18.30, Disp: 1 each, Rfl: 12   Cholecalciferol (VITAMIN D3) 5000 units CAPS, Take 5,000 Units by mouth daily., Disp: , Rfl:    cyanocobalamin 1000 MCG tablet, Take 1,000 mcg by mouth daily., Disp: , Rfl:    ezetimibe (ZETIA) 10  MG tablet, Patient knows to take by mouth  !   - Thanks, Disp: 90 tablet, Rfl: 3   gabapentin (NEURONTIN) 300 MG  capsule, Take 1 capsule in the morning and afternoon and 2 capsules at night, Disp: 360 capsule, Rfl: 3   glimepiride (AMARYL) 1 MG tablet, Take 1 tab by mouth in the morning if fasting is running 150+. Please do not take this medication if you are ill or not eating as this can cause a low blood sugar., Disp: 90 tablet, Rfl: 1   glucose blood (FREESTYLE TEST STRIPS) test strip, USE TO CHECK BLOOD GLUCOSE ONCE DAILY, Disp: 100 strip, Rfl: 3   hydrochlorothiazide (HYDRODIURIL) 25 MG tablet, TAKE ONE TABLET BY MOUTH DAILY FOR FOR BLOOD PRESSURE AND FLUID RETENTION/ ANKLE SWELLING, Disp: 90 tablet, Rfl: 1   Lancets (FREESTYLE) lancets, USE TO CHECK BLOOD GLUCOSE ONCE DAILY, Disp: 100 each, Rfl: 3   lidocaine-prilocaine (EMLA) cream, Apply to affected area once, Disp: 30 g, Rfl: 3   loratadine (CLARITIN) 10 MG tablet, Take 10 mg by mouth daily as needed for allergies., Disp: , Rfl:    Magnesium 250 MG TABS, Take 250 mg by mouth every other day., Disp: , Rfl:    metoprolol succinate (TOPROL-XL) 25 MG 24 hr tablet, Take  1 tablet  Daily  for BP       /TAKE 1 TABLET BY MOUTH DAILY FOR BLOOD PRESSURE, Disp: 90 tablet, Rfl: 3   omeprazole (PRILOSEC) 20 MG capsule, Take 1 capsule (20 mg total) by mouth daily., Disp: 30 capsule, Rfl: 11   ondansetron (ZOFRAN) 8 MG tablet, Take 1 tablet (8 mg total) by mouth 2 (two) times daily as needed for refractory nausea / vomiting. Start on day 3 after carboplatin chemo., Disp: 30 tablet, Rfl: 1   PFIZER-BIONT COVID-19 VAC-TRIS SUSP injection, , Disp: , Rfl:    prochlorperazine (COMPAZINE) 10 MG tablet, Take 1 tablet (10 mg total) by mouth every 6 (six) hours as needed for nausea or vomiting., Disp: 60 tablet, Rfl: 9   rosuvastatin (CRESTOR) 5 MG tablet, Take 1 tablet (5 mg total) by mouth 3 (three) times a week. In the evening for cholesterol., Disp: 36 tablet, Rfl: 3   vitamin C (ASCORBIC ACID) 500 MG tablet, Take 500 mg by mouth every evening. , Disp: , Rfl:   Review of  Systems: Denies appetite changes, fevers, chills, fatigue, unexplained weight changes. Denies hearing loss, neck lumps or masses, mouth sores, ringing in ears or voice changes. Denies cough or wheezing.  Denies shortness of breath. Denies chest pain or palpitations. Denies leg swelling. Denies abdominal distention, pain, blood in stools, constipation, diarrhea, nausea, vomiting, or early satiety. Denies pain with intercourse, dysuria, frequency, hematuria or incontinence. Denies hot flashes, pelvic pain, vaginal bleeding or vaginal discharge.   Denies joint pain, back pain or muscle pain/cramps. Denies itching, rash, or wounds. Denies dizziness, headaches, numbness or seizures. Denies swollen lymph nodes or glands, denies easy bruising or bleeding. Denies anxiety, depression, confusion, or decreased concentration.  Physical Exam: BP 132/69 (BP Location: Right Arm, Patient Position: Sitting)   Pulse 79   Temp 98 F (36.7 C) (Oral)   Resp 16   Ht 5' 4"  (1.626 m)   Wt 145 lb 9.6 oz (66 kg)   SpO2 100%   BMI 24.99 kg/m  General: Alert, oriented, no acute distress. HEENT: Atraumatic, normocephalic, sclera anicteric. Chest: Clear to auscultation bilaterally.  No wheezes or rhonchi. Cardiovascular: Regular rate and rhythm, no  murmurs. Abdomen: Normoactive bowel sounds.  No masses or hepatosplenomegaly appreciated.  Well-healed scar. Extremities: Grossly normal range of motion.  Warm, well perfused.  No edema bilaterally. Skin: No rashes or lesions noted. Lymphatics: No cervical, supraclavicular, or inguinal adenopathy. GU: Normal appearing external genitalia without erythema, excoriation, or lesions.  Bimanual exam reveals some nodularity within the cul-de-sac appreciated on both bimanual and rectovaginal exam.    Laboratory & Radiologic Studies: None new  Assessment & Plan: Kayla Baker is a 85 y.o. woman with advanced stage ovarian cancer, on PARPi after NACT->IDS->adj chemotherapy,  with slow increase in CA-125 and stable to small progression of peritoneal disease on recent CT.  Discussed CT findings with husband and patient. Reviewed size measurements of 3 lesions in the report that are noted to have increased mildly in size (3-46m). Given findings, the patient has progressive platinum-sensitive disease.   Luckily, from a symptom standpoint, she is completely asymptomatic.   We discussed treatment options including surgery, chemotherapy, and focusing on quality of life. Given diffuse peritoneal disease, I reviewed that she is not a candidate for surgery (data would support that I would shorten her life as I think very likely I would not be able to resect all disease). She has previously reviewed chemotherapy options with Dr. GMaurilio Lovely and she is interested in pursuing treatment. I let KElmo Putt our nurse navigator, know this, and she will work to get the patient scheduled to start treatment in the next couple of weeks.   36 minutes of total time was spent for this patient encounter, including preparation, face-to-face counseling with the patient and coordination of care, and documentation of the encounter.  KJeral Pinch MD  Division of Gynecologic Oncology  Department of Obstetrics and Gynecology  [facility]

## 2021-08-28 ENCOUNTER — Other Ambulatory Visit: Payer: Self-pay | Admitting: Hematology and Oncology

## 2021-08-28 ENCOUNTER — Telehealth: Payer: Self-pay | Admitting: Hematology and Oncology

## 2021-08-28 ENCOUNTER — Telehealth: Payer: Self-pay | Admitting: Oncology

## 2021-08-28 DIAGNOSIS — C569 Malignant neoplasm of unspecified ovary: Secondary | ICD-10-CM

## 2021-08-28 DIAGNOSIS — C50212 Malignant neoplasm of upper-inner quadrant of left female breast: Secondary | ICD-10-CM

## 2021-08-28 NOTE — Telephone Encounter (Signed)
Patient was seen by Dr. Berline Lopes for second opinion yesterday She is interested to pursue further palliative chemotherapy using combination of carboplatin and gemcitabine I will get her scheduled, hopefully to start next week I will see her on day 8 of treatment

## 2021-08-28 NOTE — Telephone Encounter (Signed)
Kayla Baker and let her know that Dr. [name] has been advised of her choice for chemotherapy and that a high priority scheduling message has been sent.  She should be receiving a call soon to schedule her infusion appointment and that I will also call her as soon as I see the appointment.  She verbalized understanding and agreement.

## 2021-08-28 NOTE — Progress Notes (Signed)
DISCONTINUE ON PATHWAY REGIMEN - Ovarian     A cycle is every 21 days:     Paclitaxel      Carboplatin   **Always confirm dose/schedule in your pharmacy ordering system**  REASON: Disease Progression PRIOR TREATMENT: [id number]: Carboplatin AUC=6 + Paclitaxel 175 mg/m2 q21 Days x 2-4 Cycles TREATMENT RESPONSE: Partial Response (PR)  START ON PATHWAY REGIMEN - Ovarian     A cycle is every 21 days:     Gemcitabine      Carboplatin   **Always confirm dose/schedule in your pharmacy ordering system**  Patient Characteristics: Recurrent or Progressive Disease, Third Line, Platinum Sensitive and ? 6 Months Since Last Platinum Therapy BRCA Mutation Status: Present ([place]) Therapeutic Status: Recurrent or Progressive Disease Line of Therapy: Third Line  Intent of Therapy: Non-Curative / Palliative Intent, Discussed with Patient

## 2021-08-29 ENCOUNTER — Telehealth: Payer: Self-pay | Admitting: Hematology and Oncology

## 2021-08-29 NOTE — Telephone Encounter (Signed)
Scheduled per sch msg. Called and left msg  

## 2021-09-02 ENCOUNTER — Telehealth: Payer: Self-pay

## 2021-09-02 NOTE — Progress Notes (Signed)
/  / Chronic Care Management Pharmacy Assistant   Name: KRISTIAN HAZZARD  MRN: [id number] DOB: [birth year]   Reason for Encounter: CCM Initial Appt with Dolphus Jenny 09/03/21   Conditions to be addressed/monitored: HTN, HLD, CKD Stage 3, and Diabetic neuropathy, Ovarian Cancer, Osteopenia, Pancytopenia, Vitamin D deficiency, Gout , Anemia, Malnutrition   Recent office visits:  06/11/2021- Dr. Melford Aase- Patient was seen for a routine follow up. No medication changes.   Recent consult visits:  08/27/2021- Dr. Berline Lopes (Gynecologic Oncology)- Patient was seen for malignant neoplasm of ovary. No medication changes.   08/26/2021- Dr. [name] (hematology and oncology)- Patient was seen for oncology treatment. Stopped Ketoconazole 2% cream and Olaparib 130m.   06/20/2021- Dr. WJacqualyn Posey(podiatry)- Patient was seen for pain in toe of right foot. No medication changes.   Hospital visits:  None in previous 6 months  Medications: Outpatient Encounter Medications as of 09/02/2021  Medication Sig   acetaminophen (TYLENOL) 325 MG tablet Take 325 mg by mouth every 6 (six) hours as needed for headache.   allopurinol (ZYLOPRIM) 300 MG tablet TAKE 1 TABLET BY MOUTH DAILY FOR GOUT PREVENTION   blood glucose meter kit and supplies KIT Dispense based on patient and insurance preference. Use to check blood sugar once daily. Dx: E11.22, N18.30   Cholecalciferol (VITAMIN D3) 5000 units CAPS Take 5,000 Units by mouth daily.   cyanocobalamin 1000 MCG tablet Take 1,000 mcg by mouth daily.   ezetimibe (ZETIA) 10 MG tablet Patient knows to take by mouth  !   - Thanks   gabapentin (NEURONTIN) 300 MG capsule Take 1 capsule in the morning and afternoon and 2 capsules at night   glimepiride (AMARYL) 1 MG tablet Take 1 tab by mouth in the morning if fasting is running 150+. Please do not take this medication if you are ill or not eating as this can cause a low blood sugar.   glucose blood (FREESTYLE TEST STRIPS) test strip USE TO CHECK  BLOOD GLUCOSE ONCE DAILY   hydrochlorothiazide (HYDRODIURIL) 25 MG tablet TAKE ONE TABLET BY MOUTH DAILY FOR FOR BLOOD PRESSURE AND FLUID RETENTION/ ANKLE SWELLING   Lancets (FREESTYLE) lancets USE TO CHECK BLOOD GLUCOSE ONCE DAILY   lidocaine-prilocaine (EMLA) cream Apply to affected area once   loratadine (CLARITIN) 10 MG tablet Take 10 mg by mouth daily as needed for allergies.   Magnesium 250 MG TABS Take 250 mg by mouth every other day.   metoprolol succinate (TOPROL-XL) 25 MG 24 hr tablet Take  1 tablet  Daily  for BP       /TAKE 1 TABLET BY MOUTH DAILY FOR BLOOD PRESSURE   omeprazole (PRILOSEC) 20 MG capsule Take 1 capsule (20 mg total) by mouth daily.   ondansetron (ZOFRAN) 8 MG tablet Take 1 tablet (8 mg total) by mouth 2 (two) times daily as needed for refractory nausea / vomiting. Start on day 3 after carboplatin chemo.   PFIZER-BIONT COVID-19 VAC-TRIS SUSP injection    prochlorperazine (COMPAZINE) 10 MG tablet Take 1 tablet (10 mg total) by mouth every 6 (six) hours as needed for nausea or vomiting.   rosuvastatin (CRESTOR) 5 MG tablet Take 1 tablet (5 mg total) by mouth 3 (three) times a week. In the evening for cholesterol.   vitamin C (ASCORBIC ACID) 500 MG tablet Take 500 mg by mouth every evening.    No facility-administered encounter medications on file as of 09/02/2021.    Care Gaps: Covid 19 immunization  Star Rating Drugs:  Rosuvastatin 55m #36, 02/22/2021 [facility] on [address] Unable to connect with patient. Left detailed vm with appt reminder,   Please bring medications and supplements to appointment   [place] [phone]

## 2021-09-03 ENCOUNTER — Ambulatory Visit: Payer: PPO | Admitting: Pharmacist

## 2021-09-04 ENCOUNTER — Other Ambulatory Visit: Payer: Self-pay | Admitting: Internal Medicine

## 2021-09-04 ENCOUNTER — Inpatient Hospital Stay: Payer: PPO | Attending: Gynecologic Oncology

## 2021-09-04 ENCOUNTER — Other Ambulatory Visit: Payer: Self-pay

## 2021-09-04 DIAGNOSIS — C786 Secondary malignant neoplasm of retroperitoneum and peritoneum: Secondary | ICD-10-CM | POA: Diagnosis not present

## 2021-09-04 DIAGNOSIS — C561 Malignant neoplasm of right ovary: Secondary | ICD-10-CM | POA: Diagnosis not present

## 2021-09-04 DIAGNOSIS — Z5111 Encounter for antineoplastic chemotherapy: Secondary | ICD-10-CM | POA: Diagnosis not present

## 2021-09-04 DIAGNOSIS — Z79899 Other long term (current) drug therapy: Secondary | ICD-10-CM | POA: Diagnosis not present

## 2021-09-04 DIAGNOSIS — C50212 Malignant neoplasm of upper-inner quadrant of left female breast: Secondary | ICD-10-CM

## 2021-09-04 DIAGNOSIS — C569 Malignant neoplasm of unspecified ovary: Secondary | ICD-10-CM

## 2021-09-04 DIAGNOSIS — D61818 Other pancytopenia: Secondary | ICD-10-CM | POA: Insufficient documentation

## 2021-09-04 DIAGNOSIS — Z95828 Presence of other vascular implants and grafts: Secondary | ICD-10-CM

## 2021-09-04 DIAGNOSIS — Z170 Estrogen receptor positive status [ER+]: Secondary | ICD-10-CM

## 2021-09-04 LAB — CBC WITH DIFFERENTIAL/PLATELET
Abs Immature Granulocytes: 0.01 10*3/uL (ref 0.00–0.07)
Basophils Absolute: 0 10*3/uL (ref 0.0–0.1)
Basophils Relative: 1 %
Eosinophils Absolute: 0.2 10*3/uL (ref 0.0–0.5)
Eosinophils Relative: 3 %
HCT: 32.5 % — ABNORMAL LOW (ref 36.0–46.0)
Hemoglobin: 10.9 g/dL — ABNORMAL LOW (ref 12.0–15.0)
Immature Granulocytes: 0 %
Lymphocytes Relative: 28 %
Lymphs Abs: 1.4 10*3/uL (ref 0.7–4.0)
MCH: 31 pg (ref 26.0–34.0)
MCHC: 33.5 g/dL (ref 30.0–36.0)
MCV: 92.3 fL (ref 80.0–100.0)
Monocytes Absolute: 0.5 10*3/uL (ref 0.1–1.0)
Monocytes Relative: 9 %
Neutro Abs: 3 10*3/uL (ref 1.7–7.7)
Neutrophils Relative %: 59 %
Platelets: 105 10*3/uL — ABNORMAL LOW (ref 150–400)
RBC: 3.52 MIL/uL — ABNORMAL LOW (ref 3.87–5.11)
RDW: 15.8 % — ABNORMAL HIGH (ref 11.5–15.5)
WBC: 5.1 10*3/uL (ref 4.0–10.5)
nRBC: 0 % (ref 0.0–0.2)

## 2021-09-04 LAB — COMPREHENSIVE METABOLIC PANEL
ALT: 18 U/L (ref 0–44)
AST: 37 U/L (ref 15–41)
Albumin: 3.7 g/dL (ref 3.5–5.0)
Alkaline Phosphatase: 109 U/L (ref 38–126)
Anion gap: 10 (ref 5–15)
BUN: 24 mg/dL — ABNORMAL HIGH (ref 8–23)
CO2: 26 mmol/L (ref 22–32)
Calcium: 9.7 mg/dL (ref 8.9–10.3)
Chloride: 106 mmol/L (ref 98–111)
Creatinine, Ser: 1.39 mg/dL — ABNORMAL HIGH (ref 0.44–1.00)
GFR, Estimated: 38 mL/min — ABNORMAL LOW (ref >60)
Glucose, Bld: 177 mg/dL — ABNORMAL HIGH (ref 70–99)
Potassium: 3.8 mmol/L (ref 3.5–5.1)
Sodium: 142 mmol/L (ref 135–145)
Total Bilirubin: 0.5 mg/dL (ref 0.3–1.2)
Total Protein: 7.1 g/dL (ref 6.5–8.1)

## 2021-09-04 MED ORDER — SODIUM CHLORIDE 0.9% FLUSH
10.0000 mL | Freq: Once | INTRAVENOUS | Status: AC
  Administered 2021-09-04: 10 mL

## 2021-09-04 MED ORDER — HEPARIN SOD (PORK) LOCK FLUSH 100 UNIT/ML IV SOLN
500.0000 [IU] | Freq: Once | INTRAVENOUS | Status: AC
  Administered 2021-09-04: 500 [IU]

## 2021-09-05 ENCOUNTER — Inpatient Hospital Stay: Payer: PPO

## 2021-09-05 VITALS — BP 135/64 | HR 78 | Temp 97.8°F | Resp 17 | Wt 143.5 lb

## 2021-09-05 DIAGNOSIS — C569 Malignant neoplasm of unspecified ovary: Secondary | ICD-10-CM

## 2021-09-05 DIAGNOSIS — Z170 Estrogen receptor positive status [ER+]: Secondary | ICD-10-CM

## 2021-09-05 DIAGNOSIS — Z5111 Encounter for antineoplastic chemotherapy: Secondary | ICD-10-CM | POA: Diagnosis not present

## 2021-09-05 LAB — CA 125: Cancer Antigen (CA) 125: 55.4 U/mL — ABNORMAL HIGH (ref 0.0–38.1)

## 2021-09-05 MED ORDER — DIPHENHYDRAMINE HCL 50 MG/ML IJ SOLN
25.0000 mg | Freq: Once | INTRAMUSCULAR | Status: AC
  Administered 2021-09-05: 25 mg via INTRAVENOUS
  Filled 2021-09-05: qty 1

## 2021-09-05 MED ORDER — SODIUM CHLORIDE 0.9% FLUSH
10.0000 mL | INTRAVENOUS | Status: DC | PRN
  Administered 2021-09-05: 10 mL

## 2021-09-05 MED ORDER — SODIUM CHLORIDE 0.9 % IV SOLN
600.0000 mg/m2 | Freq: Once | INTRAVENOUS | Status: AC
  Administered 2021-09-05: 1026 mg via INTRAVENOUS
  Filled 2021-09-05: qty 26.98

## 2021-09-05 MED ORDER — SODIUM CHLORIDE 0.9 % IV SOLN: Freq: Once | INTRAVENOUS | Status: AC

## 2021-09-05 MED ORDER — FAMOTIDINE 20 MG IN NS 100 ML IVPB
20.0000 mg | Freq: Once | INTRAVENOUS | Status: AC
  Administered 2021-09-05: 20 mg via INTRAVENOUS
  Filled 2021-09-05: qty 100

## 2021-09-05 MED ORDER — SODIUM CHLORIDE 0.9 % IV SOLN
10.0000 mg | Freq: Once | INTRAVENOUS | Status: AC
  Administered 2021-09-05: 10 mg via INTRAVENOUS
  Filled 2021-09-05: qty 10

## 2021-09-05 MED ORDER — PALONOSETRON HCL INJECTION 0.25 MG/5ML
0.2500 mg | Freq: Once | INTRAVENOUS | Status: AC
  Administered 2021-09-05: 0.25 mg via INTRAVENOUS
  Filled 2021-09-05: qty 5

## 2021-09-05 MED ORDER — HEPARIN SOD (PORK) LOCK FLUSH 100 UNIT/ML IV SOLN
500.0000 [IU] | Freq: Once | INTRAVENOUS | Status: AC | PRN
  Administered 2021-09-05: 500 [IU]

## 2021-09-05 MED ORDER — SODIUM CHLORIDE 0.9 % IV SOLN
150.0000 mg | Freq: Once | INTRAVENOUS | Status: AC
  Administered 2021-09-05: 150 mg via INTRAVENOUS
  Filled 2021-09-05: qty 150

## 2021-09-05 MED ORDER — SODIUM CHLORIDE 0.9 % IV SOLN
228.0000 mg | Freq: Once | INTRAVENOUS | Status: AC
  Administered 2021-09-05: 230 mg via INTRAVENOUS
  Filled 2021-09-05: qty 23

## 2021-09-05 NOTE — Patient Instructions (Signed)
[facility]   Discharge Instructions: Thank you for choosing [facility] to provide your oncology and hematology care.   If you have a lab appointment with the [facility], please go directly to the [facility] and check in at the registration area.   Wear comfortable clothing and clothing appropriate for easy access to any Portacath or PICC line.   We strive to give you quality time with your provider. You may need to reschedule your appointment if you arrive late (15 or more minutes).  Arriving late affects you and other patients whose appointments are after yours.  Also, if you miss three or more appointments without notifying the office, you may be dismissed from the clinic at the provider's discretion.      For prescription refill requests, have your pharmacy contact our office and allow 72 hours for refills to be completed.    Today you received the following chemotherapy and/or immunotherapy agents: gemcitabine and caboplatin.      To help prevent nausea and vomiting after your treatment, we encourage you to take your nausea medication as directed.  BELOW ARE SYMPTOMS THAT SHOULD BE REPORTED IMMEDIATELY: *FEVER GREATER THAN 100.4 F (38 C) OR HIGHER *CHILLS OR SWEATING *NAUSEA AND VOMITING THAT IS NOT CONTROLLED WITH YOUR NAUSEA MEDICATION *UNUSUAL SHORTNESS OF BREATH *UNUSUAL BRUISING OR BLEEDING *URINARY PROBLEMS (pain or burning when urinating, or frequent urination) *BOWEL PROBLEMS (unusual diarrhea, constipation, pain near the anus) TENDERNESS IN MOUTH AND THROAT WITH OR WITHOUT PRESENCE OF ULCERS (sore throat, sores in mouth, or a toothache) UNUSUAL RASH, SWELLING OR PAIN  UNUSUAL VAGINAL DISCHARGE OR ITCHING   Items with * indicate a potential emergency and should be followed up as soon as possible or go to the Emergency Department if any problems should occur.  Please show the CHEMOTHERAPY ALERT CARD or IMMUNOTHERAPY ALERT  CARD at check-in to the Emergency Department and triage nurse.  Should you have questions after your visit or need to cancel or reschedule your appointment, please contact [facility]  Dept: [phone]  and follow the prompts.  Office hours are 8:00 a.m. to 4:30 p.m. Monday - Friday. Please note that voicemails left after 4:00 p.m. may not be returned until the following business day.  We are closed weekends and major holidays. You have access to a nurse at all times for urgent questions. Please call the main number to the clinic Dept: [phone] and follow the prompts.   For any non-urgent questions, you may also contact your provider using MyChart. We now offer e-Visits for anyone 39 and older to request care online for non-urgent symptoms. For details visit [email or web address].   Also download the MyChart app! Go to the app store, search "MyChart", open the app, select [facility], and log in with your MyChart username and password.  Due to Covid, a mask is required upon entering the hospital/clinic. If you do not have a mask, one will be given to you upon arrival. For doctor visits, patients may have 1 support person aged 10 or older with them. For treatment visits, patients cannot have anyone with them due to current Covid guidelines and our immunocompromised population.   Gemcitabine injection What is this medication? GEMCITABINE (jem SYE ta been) is a chemotherapy drug. This medicine is used to treat many types of cancer like breast cancer, lung cancer, pancreatic cancer, and ovarian cancer. This medicine may be used for other purposes; ask your health  care provider or pharmacist if you have questions. COMMON BRAND NAME(S): Gemzar, Infugem What should I tell my care team before I take this medication? They need to know if you have any of these conditions: blood disorders infection kidney disease liver disease lung or breathing disease, like  asthma recent or ongoing radiation therapy an unusual or allergic reaction to gemcitabine, other chemotherapy, other medicines, foods, dyes, or preservatives pregnant or trying to get pregnant breast-feeding How should I use this medication? This drug is given as an infusion into a vein. It is administered in a hospital or clinic by a specially trained health care professional. Talk to your pediatrician regarding the use of this medicine in children. Special care may be needed. Overdosage: If you think you have taken too much of this medicine contact a poison control center or emergency room at once. NOTE: This medicine is only for you. Do not share this medicine with others. What if I miss a dose? It is important not to miss your dose. Call your doctor or health care professional if you are unable to keep an appointment. What may interact with this medication? medicines to increase blood counts like filgrastim, pegfilgrastim, sargramostim some other chemotherapy drugs like cisplatin vaccines Talk to your doctor or health care professional before taking any of these medicines: acetaminophen aspirin ibuprofen ketoprofen naproxen This list may not describe all possible interactions. Give your health care provider a list of all the medicines, herbs, non-prescription drugs, or dietary supplements you use. Also tell them if you smoke, drink alcohol, or use illegal drugs. Some items may interact with your medicine. What should I watch for while using this medication? Visit your doctor for checks on your progress. This drug may make you feel generally unwell. This is not uncommon, as chemotherapy can affect healthy cells as well as cancer cells. Report any side effects. Continue your course of treatment even though you feel ill unless your doctor tells you to stop. In some cases, you may be given additional medicines to help with side effects. Follow all directions for their use. Call your doctor  or health care professional for advice if you get a fever, chills or sore throat, or other symptoms of a cold or flu. Do not treat yourself. This drug decreases your body's ability to fight infections. Try to avoid being around people who are sick. This medicine may increase your risk to bruise or bleed. Call your doctor or health care professional if you notice any unusual bleeding. Be careful brushing and flossing your teeth or using a toothpick because you may get an infection or bleed more easily. If you have any dental work done, tell your dentist you are receiving this medicine. Avoid taking products that contain aspirin, acetaminophen, ibuprofen, naproxen, or ketoprofen unless instructed by your doctor. These medicines may hide a fever. Do not become pregnant while taking this medicine or for 6 months after stopping it. Women should inform their doctor if they wish to become pregnant or think they might be pregnant. Men should not father a child while taking this medicine and for 3 months after stopping it. There is a potential for serious side effects to an unborn child. Talk to your health care professional or pharmacist for more information. Do not breast-feed an infant while taking this medicine or for at least 1 week after stopping it. Men should inform their doctors if they wish to father a child. This medicine may lower sperm counts. Talk with your doctor  or health care professional if you are concerned about your fertility. What side effects may I notice from receiving this medication? Side effects that you should report to your doctor or health care professional as soon as possible: allergic reactions like skin rash, itching or hives, swelling of the face, lips, or tongue breathing problems pain, redness, or irritation at site where injected signs and symptoms of a dangerous change in heartbeat or heart rhythm like chest pain; dizziness; fast or irregular heartbeat; palpitations; feeling  faint or lightheaded, falls; breathing problems signs of decreased platelets or bleeding - bruising, pinpoint red spots on the skin, black, tarry stools, blood in the urine signs of decreased red blood cells - unusually weak or tired, feeling faint or lightheaded, falls signs of infection - fever or chills, cough, sore throat, pain or difficulty passing urine signs and symptoms of kidney injury like trouble passing urine or change in the amount of urine signs and symptoms of liver injury like dark yellow or brown urine; general ill feeling or flu-like symptoms; light-colored stools; loss of appetite; nausea; right upper belly pain; unusually weak or tired; yellowing of the eyes or skin swelling of ankles, feet, hands Side effects that usually do not require medical attention (report to your doctor or health care professional if they continue or are bothersome): constipation diarrhea hair loss loss of appetite nausea rash vomiting This list may not describe all possible side effects. Call your doctor for medical advice about side effects. You may report side effects to FDA at 1-800-FDA-1088. Where should I keep my medication? This drug is given in a hospital or clinic and will not be stored at home. NOTE: This sheet is a summary. It may not cover all possible information. If you have questions about this medicine, talk to your doctor, pharmacist, or health care provider.  2022 Elsevier/Gold Standard (2018-02-10 18:06:11)  Carboplatin injection What is this medication? CARBOPLATIN (KAR boe pla tin) is a chemotherapy drug. It targets fast dividing cells, like cancer cells, and causes these cells to die. This medicine is used to treat ovarian cancer and many other cancers. This medicine may be used for other purposes; ask your health care provider or pharmacist if you have questions. COMMON BRAND NAME(S): Paraplatin What should I tell my care team before I take this medication? They need to  know if you have any of these conditions: blood disorders hearing problems kidney disease recent or ongoing radiation therapy an unusual or allergic reaction to carboplatin, cisplatin, other chemotherapy, other medicines, foods, dyes, or preservatives pregnant or trying to get pregnant breast-feeding How should I use this medication? This drug is usually given as an infusion into a vein. It is administered in a hospital or clinic by a specially trained health care professional. Talk to your pediatrician regarding the use of this medicine in children. Special care may be needed. Overdosage: If you think you have taken too much of this medicine contact a poison control center or emergency room at once. NOTE: This medicine is only for you. Do not share this medicine with others. What if I miss a dose? It is important not to miss a dose. Call your doctor or health care professional if you are unable to keep an appointment. What may interact with this medication? medicines for seizures medicines to increase blood counts like filgrastim, pegfilgrastim, sargramostim some antibiotics like amikacin, gentamicin, neomycin, streptomycin, tobramycin vaccines Talk to your doctor or health care professional before taking any of these medicines: acetaminophen  aspirin ibuprofen ketoprofen naproxen This list may not describe all possible interactions. Give your health care provider a list of all the medicines, herbs, non-prescription drugs, or dietary supplements you use. Also tell them if you smoke, drink alcohol, or use illegal drugs. Some items may interact with your medicine. What should I watch for while using this medication? Your condition will be monitored carefully while you are receiving this medicine. You will need important blood work done while you are taking this medicine. This drug may make you feel generally unwell. This is not uncommon, as chemotherapy can affect healthy cells as well as  cancer cells. Report any side effects. Continue your course of treatment even though you feel ill unless your doctor tells you to stop. In some cases, you may be given additional medicines to help with side effects. Follow all directions for their use. Call your doctor or health care professional for advice if you get a fever, chills or sore throat, or other symptoms of a cold or flu. Do not treat yourself. This drug decreases your body's ability to fight infections. Try to avoid being around people who are sick. This medicine may increase your risk to bruise or bleed. Call your doctor or health care professional if you notice any unusual bleeding. Be careful brushing and flossing your teeth or using a toothpick because you may get an infection or bleed more easily. If you have any dental work done, tell your dentist you are receiving this medicine. Avoid taking products that contain aspirin, acetaminophen, ibuprofen, naproxen, or ketoprofen unless instructed by your doctor. These medicines may hide a fever. Do not become pregnant while taking this medicine. Women should inform their doctor if they wish to become pregnant or think they might be pregnant. There is a potential for serious side effects to an unborn child. Talk to your health care professional or pharmacist for more information. Do not breast-feed an infant while taking this medicine. What side effects may I notice from receiving this medication? Side effects that you should report to your doctor or health care professional as soon as possible: allergic reactions like skin rash, itching or hives, swelling of the face, lips, or tongue signs of infection - fever or chills, cough, sore throat, pain or difficulty passing urine signs of decreased platelets or bleeding - bruising, pinpoint red spots on the skin, black, tarry stools, nosebleeds signs of decreased red blood cells - unusually weak or tired, fainting spells, lightheadedness breathing  problems changes in hearing changes in vision chest pain high blood pressure low blood counts - This drug may decrease the number of white blood cells, red blood cells and platelets. You may be at increased risk for infections and bleeding. nausea and vomiting pain, swelling, redness or irritation at the injection site pain, tingling, numbness in the hands or feet problems with balance, talking, walking trouble passing urine or change in the amount of urine Side effects that usually do not require medical attention (report to your doctor or health care professional if they continue or are bothersome): hair loss loss of appetite metallic taste in the mouth or changes in taste This list may not describe all possible side effects. Call your doctor for medical advice about side effects. You may report side effects to FDA at 1-800-FDA-1088. Where should I keep my medication? This drug is given in a hospital or clinic and will not be stored at home. NOTE: This sheet is a summary. It may not cover all possible  information. If you have questions about this medicine, talk to your doctor, pharmacist, or health care provider.  2022 Elsevier/Gold Standard (2008-02-22 14:38:05)

## 2021-09-12 ENCOUNTER — Other Ambulatory Visit: Payer: Self-pay

## 2021-09-12 ENCOUNTER — Encounter: Payer: Self-pay | Admitting: Hematology and Oncology

## 2021-09-12 ENCOUNTER — Inpatient Hospital Stay: Payer: PPO | Admitting: Hematology and Oncology

## 2021-09-12 ENCOUNTER — Inpatient Hospital Stay: Payer: PPO

## 2021-09-12 DIAGNOSIS — C569 Malignant neoplasm of unspecified ovary: Secondary | ICD-10-CM

## 2021-09-12 DIAGNOSIS — C50212 Malignant neoplasm of upper-inner quadrant of left female breast: Secondary | ICD-10-CM

## 2021-09-12 DIAGNOSIS — N183 Chronic kidney disease, stage 3 unspecified: Secondary | ICD-10-CM | POA: Diagnosis not present

## 2021-09-12 DIAGNOSIS — Z5111 Encounter for antineoplastic chemotherapy: Secondary | ICD-10-CM | POA: Diagnosis not present

## 2021-09-12 DIAGNOSIS — R748 Abnormal levels of other serum enzymes: Secondary | ICD-10-CM

## 2021-09-12 DIAGNOSIS — D61818 Other pancytopenia: Secondary | ICD-10-CM | POA: Diagnosis not present

## 2021-09-12 DIAGNOSIS — E1122 Type 2 diabetes mellitus with diabetic chronic kidney disease: Secondary | ICD-10-CM | POA: Diagnosis not present

## 2021-09-12 DIAGNOSIS — Z95828 Presence of other vascular implants and grafts: Secondary | ICD-10-CM

## 2021-09-12 DIAGNOSIS — Z170 Estrogen receptor positive status [ER+]: Secondary | ICD-10-CM

## 2021-09-12 LAB — CBC WITH DIFFERENTIAL/PLATELET
Abs Immature Granulocytes: 0.02 10*3/uL (ref 0.00–0.07)
Basophils Absolute: 0 10*3/uL (ref 0.0–0.1)
Basophils Relative: 0 %
Eosinophils Absolute: 0 10*3/uL (ref 0.0–0.5)
Eosinophils Relative: 1 %
HCT: 30.7 % — ABNORMAL LOW (ref 36.0–46.0)
Hemoglobin: 10.4 g/dL — ABNORMAL LOW (ref 12.0–15.0)
Immature Granulocytes: 1 %
Lymphocytes Relative: 36 %
Lymphs Abs: 1 10*3/uL (ref 0.7–4.0)
MCH: 30.6 pg (ref 26.0–34.0)
MCHC: 33.9 g/dL (ref 30.0–36.0)
MCV: 90.3 fL (ref 80.0–100.0)
Monocytes Absolute: 0.1 10*3/uL (ref 0.1–1.0)
Monocytes Relative: 5 %
Neutro Abs: 1.5 10*3/uL — ABNORMAL LOW (ref 1.7–7.7)
Neutrophils Relative %: 57 %
Platelets: 51 10*3/uL — ABNORMAL LOW (ref 150–400)
RBC: 3.4 MIL/uL — ABNORMAL LOW (ref 3.87–5.11)
RDW: 14.5 % (ref 11.5–15.5)
WBC: 2.7 10*3/uL — ABNORMAL LOW (ref 4.0–10.5)
nRBC: 0 % (ref 0.0–0.2)

## 2021-09-12 LAB — CMP (CANCER CENTER ONLY)
ALT: 63 U/L — ABNORMAL HIGH (ref 0–44)
AST: 52 U/L — ABNORMAL HIGH (ref 15–41)
Albumin: 3.7 g/dL (ref 3.5–5.0)
Alkaline Phosphatase: 105 U/L (ref 38–126)
Anion gap: 7 (ref 5–15)
BUN: 34 mg/dL — ABNORMAL HIGH (ref 8–23)
CO2: 28 mmol/L (ref 22–32)
Calcium: 9.9 mg/dL (ref 8.9–10.3)
Chloride: 109 mmol/L (ref 98–111)
Creatinine: 1.06 mg/dL — ABNORMAL HIGH (ref 0.44–1.00)
GFR, Estimated: 52 mL/min — ABNORMAL LOW (ref >60)
Glucose, Bld: 118 mg/dL — ABNORMAL HIGH (ref 70–99)
Potassium: 3.8 mmol/L (ref 3.5–5.1)
Sodium: 144 mmol/L (ref 135–145)
Total Bilirubin: 0.7 mg/dL (ref 0.3–1.2)
Total Protein: 6.9 g/dL (ref 6.5–8.1)

## 2021-09-12 MED ORDER — SODIUM CHLORIDE 0.9% FLUSH
10.0000 mL | Freq: Once | INTRAVENOUS | Status: AC
  Administered 2021-09-12: 10 mL

## 2021-09-12 NOTE — Assessment & Plan Note (Signed)
This is due to side effects of chemotherapy She does not need transfusion support We will space out her appointment as above

## 2021-09-12 NOTE — Assessment & Plan Note (Signed)
She tolerated treatment poorly with severe pancytopenia and elevated liver enzymes, likely due to chronic exposure to chemotherapy and slow recovery of bone marrow reserves We will hold treatment today and reschedule to 10 days from now She is already receiving the lowest dose of carboplatin possible, in my opinion I plan to reduce gemcitabine to 500 mg per metered square and to modify her chemotherapy schedule in the future to every other week She needs minimum 3 cycles of treatment before repeating imaging study

## 2021-09-12 NOTE — Progress Notes (Signed)
[place] OFFICE PROGRESS NOTE  Patient Care Team: [name], MD as PCP - Onalee Hua, Wille Glaser, OD as Referring Physician (Optometry) [name], MD as Consulting Physician (Dermatology) Latanya Maudlin, MD as Consulting Physician (Orthopedic Surgery) Inda Castle, MD (Inactive) as Consulting Physician (Gastroenterology) Jacqulyn Liner, RN as Oncology Nurse Navigator (Oncology) Heath Lark, MD as Consulting Physician (Hematology and Oncology) Madelon Lips, MD as Consulting Physician (Nephrology) Rush Landmark, [facility] as Pharmacist (Pharmacist)  ASSESSMENT & PLAN:  Ovarian cancer [facility]) She tolerated treatment poorly with severe pancytopenia and elevated liver enzymes, likely due to chronic exposure to chemotherapy and slow recovery of bone marrow reserves We will hold treatment today and reschedule to 10 days from now She is already receiving the lowest dose of carboplatin possible, in my opinion I plan to reduce gemcitabine to 500 mg per metered square and to modify her chemotherapy schedule in the future to every other week She needs minimum 3 cycles of treatment before repeating imaging study  Pancytopenia, acquired [facility]) This is due to side effects of chemotherapy She does not need transfusion support We will space out her appointment as above  Elevated liver enzymes This is due to side effects of chemotherapy I plan to reduce dose of gemcitabine as above  CKD stage 3 due to type 2 diabetes mellitus ([place]) She has stable chronic kidney disease stage III Observe closely  No orders of the defined types were placed in this encounter.   All questions were answered. The patient knows to call the clinic with any problems, questions or concerns. The total time spent in the appointment was 30 minutes encounter with patients including review of chart and various tests results, discussions about plan of care and coordination of care plan   Heath Lark,  MD 09/12/2021 10:08 AM  INTERVAL HISTORY: Please see below for problem oriented charting. she returns for treatment follow-up for day 8 of gemcitabine, cycle 1 for recurrent ovarian cancer She is here with her husband She tolerated last cycle of treatment well Denies nausea No recent infection, fever or chills No recent bleeding  REVIEW OF SYSTEMS:   Constitutional: Denies fevers, chills or abnormal weight loss Eyes: Denies blurriness of vision Ears, nose, mouth, throat, and face: Denies mucositis or sore throat Respiratory: Denies cough, dyspnea or wheezes Cardiovascular: Denies palpitation, chest discomfort or lower extremity swelling Gastrointestinal:  Denies nausea, heartburn or change in bowel habits Skin: Denies abnormal skin rashes Lymphatics: Denies new lymphadenopathy or easy bruising Neurological:Denies numbness, tingling or new weaknesses Behavioral/Psych: Mood is stable, no new changes  All other systems were reviewed with the patient and are negative.  I have reviewed the past medical history, past surgical history, social history and family history with the patient and they are unchanged from previous note.  ALLERGIES:  is allergic to keflex [cephalexin], meloxicam, prednisone, premarin [estrogens conjugated], and trovan [alatrofloxacin].  MEDICATIONS:  Current Outpatient Medications  Medication Sig Dispense Refill   acetaminophen (TYLENOL) 325 MG tablet Take 325 mg by mouth every 6 (six) hours as needed for headache.     allopurinol (ZYLOPRIM) 300 MG tablet TAKE 1 TABLET BY MOUTH DAILY FOR GOUT PREVENTION 90 tablet 1   blood glucose meter kit and supplies KIT Dispense based on patient and insurance preference. Use to check blood sugar once daily. Dx: E11.22, N18.30 1 each 12   Cholecalciferol (VITAMIN D3) 5000 units CAPS Take 5,000 Units by mouth daily.     cyanocobalamin 1000 MCG tablet Take  1,000 mcg by mouth daily.     ezetimibe (ZETIA) 10 MG tablet Patient  knows to take by mouth  !   - Thanks 90 tablet 3   gabapentin (NEURONTIN) 300 MG capsule Take 1 capsule in the morning and afternoon and 2 capsules at night 360 capsule 3   glimepiride (AMARYL) 1 MG tablet Take 1 tab by mouth in the morning if fasting is running 150+. Please do not take this medication if you are ill or not eating as this can cause a low blood sugar. 90 tablet 1   glucose blood (FREESTYLE TEST STRIPS) test strip USE TO CHECK BLOOD GLUCOSE ONCE DAILY 100 strip 3   hydrochlorothiazide (HYDRODIURIL) 25 MG tablet TAKE ONE TABLET BY MOUTH DAILY FOR FOR BLOOD PRESSURE AND FLUID RETENTION/ ANKLE SWELLING 90 tablet 1   Lancets (FREESTYLE) lancets CHECK BLOOD SUGAR DAILY AS DIRECTED 100 each 3   lidocaine-prilocaine (EMLA) cream Apply to affected area once 30 g 3   loratadine (CLARITIN) 10 MG tablet Take 10 mg by mouth daily as needed for allergies.     Magnesium 250 MG TABS Take 250 mg by mouth every other day.     metoprolol succinate (TOPROL-XL) 25 MG 24 hr tablet Take  1 tablet  Daily  for BP       /TAKE 1 TABLET BY MOUTH DAILY FOR BLOOD PRESSURE 90 tablet 3   omeprazole (PRILOSEC) 20 MG capsule Take 1 capsule (20 mg total) by mouth daily. 30 capsule 11   ondansetron (ZOFRAN) 8 MG tablet Take 1 tablet (8 mg total) by mouth 2 (two) times daily as needed for refractory nausea / vomiting. Start on day 3 after carboplatin chemo. 30 tablet 1   PFIZER-BIONT COVID-19 VAC-TRIS SUSP injection      prochlorperazine (COMPAZINE) 10 MG tablet Take 1 tablet (10 mg total) by mouth every 6 (six) hours as needed for nausea or vomiting. 60 tablet 9   rosuvastatin (CRESTOR) 5 MG tablet Take 1 tablet (5 mg total) by mouth 3 (three) times a week. In the evening for cholesterol. 36 tablet 3   vitamin C (ASCORBIC ACID) 500 MG tablet Take 500 mg by mouth every evening.      No current facility-administered medications for this visit.    SUMMARY OF ONCOLOGIC HISTORY: Oncology History Overview Note  HRD  positive   Breast cancer of upper-inner quadrant of left female breast ([place])  02/26/2016 Initial Diagnosis   Left breast biopsy 11:00 position: invasive ductal carcinoma with DCIS, ER 90%, PR 10%, HER-2 negative, Ki-67 30%, grade 2, 2.2 cm palpable lesion T2 N0 stage II a clinical stage   03/18/2016 Surgery   Left lumpectomy: Invasive ductal carcinoma, grade 2, 6.3 cm, with high-grade DCIS, margins negative, 0/4 lymph nodes negative, ER 90%, via 10%, HER-2 negative ratio 0.97, Ki-67 30%, T3 N0 stage IIB   03/25/2016 Procedure   Genetic testing is negative for pathogenic mutations within any of the 20 Genes on the breast/ovarian cancer panel   04/04/2016 Oncotype testing   Oncotype DX recurrence score 37, 25% 10 year distant risk of recurrence   04/17/2016 - 07/31/2016 Chemotherapy   Adjuvant chemotherapy with dose dense Adriamycin and Cytoxan followed by Abraxane weekly 8 ( discontinued due to neuropathy)   09/01/2016 - 09/26/2016 Radiation Therapy   Adj XRT 1) Left breast: 42.5 Gy in 17 fractions. 2) Left breast boost: 7.5 Gy in 3 fractions.   12/02/2016 -  Anti-estrogen oral therapy   Anastrozole 1  mg daily   09/05/2021 -  Chemotherapy   Patient is on Treatment Plan : OVARIAN RECURRENT 3RD LINE Carboplatin D1 / Gemcitabine D1,8 (4/800) q21d     Ovarian cancer ([place])  04/03/2020 Imaging   US pelvis Complex cystic and solid mass in LEFT adnexa 12.5 cm diameter question cystic ovarian neoplasm; recommend correlation with serum tumor markers and further evaluation by MR imaging with and without contrast.     04/04/2020 Imaging   US venous Doppler No evidence of deep venous thrombosis in the right lower extremity. Left common femoral vein also patent.   04/15/2020 Imaging   MRI pelvis 1. Large complex solid and cystic mass arising from the right adnexa measuring 10.8 by 11.5 by 11.1 cm. This has an aggressive appearance with extensive enhancing mural soft tissue components. Findings are highly  suspicious for malignant ovarian neoplasm. 2. Extensive bilateral retroperitoneal and bilateral iliac adenopathy compatible with metastatic disease. 3. Signs of extensive peritoneal carcinomatosis including ascites, enhancement, thickening and nodularity of the peritoneal reflections, omental caking and bulky peritoneal nodularity. 4. Suspected serosal involvement of the dome of bladder with loss of normal fat plane. Cannot rule out mural invasion by tumor.   04/17/2020 Tumor Marker   Patient's tumor was tested for the following markers: CA-125 Results of the tumor marker test revealed 1762.   04/20/2020 Cancer Staging   Staging form: Ovary, Fallopian Tube, and Primary Peritoneal Carcinoma, AJCC 8th Edition - Clinical: FIGO Stage IIIC (cT3, cN1, cM0) - Signed by Heath Lark, MD on 04/20/2020   04/23/2020 Imaging   1. Redemonstrated dominant mixed solid and cystic mass arising from the vicinity of the right ovary measuring at least 11.5 x 10.7 cm, not significantly changed compared to prior MR, and consistent with primary ovarian malignancy.  2. Numerous bulky retroperitoneal, bilateral iliac, and pelvic sidewall lymph nodes. 3. Moderate volume ascites throughout the abdomen and pelvis with subtle thickening and nodularity throughout the peritoneum, and extensive bulky nodular metastatic disease of the omentum. 4. Constellation of findings is consistent with advanced nodal and peritoneal metastatic disease. 5. There are prominent subcentimeter epicardial lymph nodes, nonspecific although suspicious for nodal metastatic disease. No definite nodal metastatic disease in the chest. Attention on follow-up. 6. There are multiple small subpleural nodules at the right lung base overlying the diaphragm, measuring up to 7 mm. These are generally nonspecific and less favored to represent pulmonary metastatic disease given distribution. Attention on follow-up. 7. Somewhat coarse contour of the liver, suggestive  of cirrhosis, although out without overt morphologic stigmata. 8. Aortic Atherosclerosis (ICD10-I70.0).     04/24/2020 Procedure   Successful placement of a right internal jugular approach power injectable Port-A-Cath. The catheter is ready for immediate use.     05/02/2020 Tumor Marker   Patient's tumor was tested for the following markers: CA-125 Results of the tumor marker test revealed 1921   05/03/2020 - 12/12/2020 Chemotherapy   The patient had carboplatin and taxol for chemotherapy treatment.     05/08/2020 Procedure   Successful ultrasound-guided paracentesis yielding 2.6 liters of peritoneal fluid.   05/16/2020 Procedure   Successful ultrasound-guided paracentesis yielding 3.2 liters of peritoneal fluid   05/25/2020 Procedure   Successful ultrasound-guided paracentesis yielding 3 liters of peritoneal fluid.     06/01/2020 Procedure   Successful ultrasound-guided therapeutic paracentesis yielding 1.7 liters of peritoneal fluid.   06/14/2020 Tumor Marker   Patient's tumor was tested for the following markers: CA-125 Results of the tumor marker test revealed 1309  06/20/2020 Imaging   1. Dominant mixed cystic and solid mass in the central pelvis is stable in the interval. 2. Clear interval decrease in retroperitoneal and pelvic sidewall lymphadenopathy. The bulky omental disease has also clearly decreased in the interval. 3. Interval decrease in ascites. 4. Stable 8 mm subpleural nodule along the diaphragm. 5. Aortic Atherosclerosis (ICD10-I70.0).   07/10/2020 Tumor Marker   Patient's tumor was tested for the following markers: CA-125 Results of the tumor marker test revealed 220   08/03/2020 Tumor Marker   Patient's tumor was tested for the following markers: CA-125 Results of the tumor marker test revealed 53.3.   09/03/2020 Tumor Marker   Patient's tumor was tested for the following markers: CA-125 Results of the tumor marker test revealed 29.7   09/20/2020 Imaging   1.  Substantial improvement. The right ovarian cystic and solid mass is half the volume that it measured on 06/20/2020. Similar reduction in the bulk of the omental caking of tumor. Prior ascites is resolved and the retroperitoneal adenopathy is markedly improved. 2. Other imaging findings of potential clinical significance: Stable pleural-based nodularity along the right hemidiaphragm measuring about 1.1 by 0.7 by 0.3 cm. Stable hypodense lesion of the right kidney upper pole, probably a cyst although the configuration of this lesion makes it difficult to obtain accurate density measurements. Left ovarian cyst, stable. Chondrocalcinosis involving the acetabular labra. Mild lumbar spondylosis and degenerative disc disease. 3. Aortic atherosclerosis.     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 (stage ypT3c), predominantly viable with focal fibrosis and hemosiderin laden macrophages (possible mild treatment effect)  B: Ovary and fallopian tube, right, salpingo-oophorectomy - High grade serous carcinoma involving right ovary and fallopian tube with surface involvement, size up to 8 cm  - Areas of necrosis and hemosiderin laden macrophages suggestive of treatment effect - 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 - See synoptic report and comment  C: Ovary and fallopian tube, left, salpingo-oophorectomy - High grade serous carcinoma involving the left fallopian tube, size up to 0.5 cm - Ovary with no definite involvement by carcinoma identified - Nodular area of endometriosis and peritoneal inclusion cyst also present - See synoptic report and comment   Procedure:    Bilateral salpingo-oophorectomy    Procedure:    Omentectomy     Specimen Integrity of Right Ovary:    Intact with surface involvement by tumor    Specimen Integrity of Left Ovary:    Capsule intact   TUMOR Tumor Site:    Right fallopian tube: favor as primary site; also involves right ovary and left fallopian tube  Histologic Type:    Serous carcinoma  Histologic Grade:    High grade  Tumor Size:    Greatest Dimension (Centimeters): in fallopian tube: 1.0 cm; in ovary: 8.0 cm Ovarian Surface Involvement:    Present    Laterality:    Right  Fallopian Tube Surface Involvement:    Present    Laterality:    Bilateral  Other Tissue / Organ Involvement:    Right ovary  Other Tissue / Organ Involvement:    Right fallopian tube  Other Tissue / Organ Involvement:    Left fallopian tube  Other Tissue / Organ Involvement:    Omentum  Largest Extrapelvic Peritoneal Focus:    Macroscopic (  greater than 2 cm)  Peritoneal / Ascitic Fluid:    Not submitted / unknown  Pleural Fluid:    Not submitted / unknown  Treatment Effect:    No definite or minimal response identified (chemotherapy response score [CRS] 1)   LYMPH NODES Regional Lymph Nodes:    No lymph nodes submitted or found   PATHOLOGIC STAGE CLASSIFICATION (pTNM, AJCC 8th Edition) TNM Descriptors:    y (post-treatment)  Primary Tumor (pT):    pT3c  Regional Lymph Nodes (pN):    pNX   FIGO STAGE FIGO Stage:    IIIC   ADDITIONAL FINDINGS Additional Findings:    Serous tubal intraepithelial carcinoma (STIC)  Additional Findings:    Left ovary with no definite carcinoma identified, left-sided nodule of endometriosis and peritoneal inclusion cyst    10/18/2020 - 10/20/2020 Hospital Admission   She was admitted to [place] for interval debulking surgery   11/19/2020 Tumor Marker   Patient's tumor was tested for the following markers: CA-125. Results of the tumor marker test revealed 38.3   12/12/2020 Tumor Marker   Patient's tumor was tested for the following markers CA-125. Results of the tumor  marker test revealed 42.3.   01/02/2021 Tumor Marker   Patient's tumor was tested for the following markers: CA-125. Results of the tumor marker test revealed 37.3   01/29/2021 Tumor Marker   Patient's tumor was tested for the following markers: CA-125 Results of the tumor marker test revealed 44.3   01/29/2021 Imaging   1. Interval increase in loculated appearing ascites in the low central abdomen and pelvis. 2. There is extensive peritoneal nodularity surrounding this fluid, the nodularity itself not appreciably changed in appearance compared to prior examination. 3. Additional peritoneal nodularity and/or mesenteric lymph nodes are unchanged. 4. Interval decrease in size of a low-attenuation nodule overlying the right hemidiaphragm, now measuring 1.2 cm, previously 1.8 cm when measured similarly. Findings are consistent with treatment response of a metastatic nodule. No other evidence of intrathoracic metastatic disease. 5. Status post hysterectomy, oophorectomy, and omentectomy.   01/31/2021 - 08/26/2021 Chemotherapy   She is started on Olaparib       03/04/2021 Tumor Marker   Patient's tumor was tested for the following markers: CA-125 Results of the tumor marker test revealed 39.7   04/01/2021 Tumor Marker   Patient's tumor was tested for the following markers: CA-125 Results of the tumor marker test revealed 32.3   05/10/2021 Imaging   1. No significant change in peritoneal carcinomatosis since previous study of 3 months ago, primarily in the pelvis where there is complex fluid and peritoneal nodularity. 2. No evidence of solid visceral organ metastasis. No evidence of bowel or ureteral obstruction. 3.  Aortic Atherosclerosis (ICD10-I70.0).   06/13/2021 Tumor Marker   Patient's tumor was tested for the following markers: CA-125. Results of the tumor marker test revealed 48.3.   07/29/2021 Tumor Marker   Patient's tumor was tested for the following markers: CA-125. Results of the  tumor marker test revealed 54.   08/25/2021 Imaging   1. There is extensive peritoneal soft tissue thickening and nodularity, particularly about the low right hemipelvis, which is similar to prior examination. Some small peritoneal nodules throughout the abdomen and pelvis are slightly enlarged, other nodules are unchanged. 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 2. No significant change in a subpleural nodule overlying the right hemidiaphragm. 3. Unchanged, loculated small volume pelvic ascites. 4. Status post hysterectomy, oophorectomy, and omentectomy. 5.  Coronary artery disease.   Aortic Atherosclerosis (ICD10-I70.0).     08/26/2021 Tumor Marker   Patient's tumor was tested for the following markers: CA-125. Results of the tumor marker test revealed 56.   09/04/2021 Tumor Marker   Patient's tumor was tested for the following markers: CA-125. Results of the tumor marker test revealed 55.4.   09/05/2021 -  Chemotherapy   Patient is on Treatment Plan : OVARIAN RECURRENT 3RD LINE Carboplatin D1 / Gemcitabine D1,8 (4/800) q21d       PHYSICAL EXAMINATION: ECOG PERFORMANCE STATUS: 1 - Symptomatic but completely ambulatory  Vitals:   09/12/21 0812  BP: (!) 123/59  Pulse: 86  Resp: 18  Temp: 97.8 F (36.6 C)  SpO2: 98%   Filed Weights   09/12/21 0812  Weight: 140 lb 12.8 oz (63.9 kg)    GENERAL:alert, no distress and comfortable  NEURO: alert & oriented x 3 with fluent speech, no focal motor/sensory deficits  LABORATORY DATA:  I have reviewed the data as listed    Component Value Date/Time   NA 144 09/12/2021 0744   NA 141 11/06/2016 1102   K 3.8 09/12/2021 0744   K 3.9 11/06/2016 1102   CL 109 09/12/2021 0744   CO2 28 09/12/2021 0744   CO2 27 11/06/2016 1102   GLUCOSE 118 (H) 09/12/2021 0744   GLUCOSE 130 11/06/2016 1102   BUN 34 (H) 09/12/2021 0744   BUN 18.0 11/06/2016 1102   CREATININE 1.06 (H) 09/12/2021 0744    CREATININE 1.42 (H) 02/21/2021 1414   CREATININE 1.3 (H) 11/06/2016 1102   CALCIUM 9.9 09/12/2021 0744   CALCIUM 10.2 11/06/2016 1102   PROT 6.9 09/12/2021 0744   PROT 7.1 11/06/2016 1102   ALBUMIN 3.7 09/12/2021 0744   ALBUMIN 3.7 11/06/2016 1102   AST 52 (H) 09/12/2021 0744   AST 52 (H) 11/06/2016 1102   ALT 63 (H) 09/12/2021 0744   ALT 58 (H) 11/06/2016 1102   ALKPHOS 105 09/12/2021 0744   ALKPHOS 159 (H) 11/06/2016 1102   BILITOT 0.7 09/12/2021 0744   BILITOT 0.53 11/06/2016 1102   GFRNONAA 52 (L) 09/12/2021 0744   GFRNONAA 34 (L) 02/21/2021 1414   GFRAA 40 (L) 02/21/2021 1414    No results found for: SPEP, UPEP  Lab Results  Component Value Date   WBC 2.7 (L) 09/12/2021   NEUTROABS 1.5 (L) 09/12/2021   HGB 10.4 (L) 09/12/2021   HCT 30.7 (L) 09/12/2021   MCV 90.3 09/12/2021   PLT 51 (L) 09/12/2021      Chemistry      Component Value Date/Time   NA 144 09/12/2021 0744   NA 141 11/06/2016 1102   K 3.8 09/12/2021 0744   K 3.9 11/06/2016 1102   CL 109 09/12/2021 0744   CO2 28 09/12/2021 0744   CO2 27 11/06/2016 1102   BUN 34 (H) 09/12/2021 0744   BUN 18.0 11/06/2016 1102   CREATININE 1.06 (H) 09/12/2021 0744   CREATININE 1.42 (H) 02/21/2021 1414   CREATININE 1.3 (H) 11/06/2016 1102      Component Value Date/Time   CALCIUM 9.9 09/12/2021 0744   CALCIUM 10.2 11/06/2016 1102   ALKPHOS 105 09/12/2021 0744   ALKPHOS 159 (H) 11/06/2016 1102   AST 52 (H) 09/12/2021 0744   AST 52 (H) 11/06/2016 1102   ALT 63 (H) 09/12/2021 0744   ALT 58 (H) 11/06/2016 1102   BILITOT 0.7 09/12/2021 0744   BILITOT 0.53 11/06/2016 1102

## 2021-09-12 NOTE — Assessment & Plan Note (Signed)
This is due to side effects of chemotherapy I plan to reduce dose of gemcitabine as above

## 2021-09-12 NOTE — Assessment & Plan Note (Signed)
She has stable chronic kidney disease stage III Observe closely

## 2021-09-18 DIAGNOSIS — C786 Secondary malignant neoplasm of retroperitoneum and peritoneum: Secondary | ICD-10-CM | POA: Insufficient documentation

## 2021-09-18 LAB — HM DIABETES EYE EXAM

## 2021-09-18 NOTE — Progress Notes (Signed)
AWV and FOLLOW UP  Assessment:   Annual Medicare Wellness Visit Due annually  Health maintenance reviewed  Atherosclerosis of aorta ([place]) - per CT 05/10/2021 Control blood pressure, cholesterol, glucose, increase exercise.   Hyperlipidemia associated with T2DM ([place]) Continue rosuvastatin; monitor closely on chemo; some LFT elevations LDL goal is <70 Continue low cholesterol diet and exercise.  Check lipid panel.   Diabetes with diabetic chronic kidney disease ([place]) Currently with slow weight loss and A1Cs in prediabetic range; off metformin, on PRN low dose glimepiride -take only if fasting 150+, needs rarely  Continue diet and exercise.  Perform daily foot/skin check, notify office of any concerning changes.  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 Hollie Salk, improved Off of metformin Increase fluids, avoid NSAIDS, monitor sugars, will monitor  Diabetic neuropathy, painful (HCC)/ chemotherapy induced neuropathy (HCC) Check feet daily, gabapentin 100-300 mg PRN at night.   Diabetic retinopathy, bil (HCC) Control sugars, ophth following  Malignant neoplasm of upper-inner quadrant of left breast in female, estrogen receptor positive ([place]) Being monitored by annual mammogram and oncology  Ovarian cancer (HCC)/ Peritoneal metastasis ([place]) Chemotherapy followed by Dr. [name], has port, S/p resection 10/2020, continues on oral chemo, plan for 1-2 years, discussing alternative options due to intolerance Has routine CBC/CMP by oncology  She would like to live past the holidays Reviewed lifestyle for immune support; increase fresh fruits and veggies, beans, whole grain; minimal processed/red meat/animal products. Increase brisk walking.  Loaned book "Anticancer: a new way of life" by Dr. Lendon Ka  Pancytopenia (HCC)/anemia in CKD/anemia in neoplastic disease Hem/onc following closely; Check CBC  Gout Continue allopurinol Diet discussed Check  uric acid   GERD  Hx of; recently well controlled; discussed PPI and risks with long term use; will attempt slow taper with H2i if needed Discussed diet, avoiding triggers and other lifestyle changes  Osteopenia - DEXA ordered to schedule with next mammogram  - increase exercise, high calcium diet, continue vitamin D suppement  Vit D def Continue to recommend supplementation for goal of 60-100 Check vitamin D level at CPE  Orders Placed This Encounter  Procedures   DG Bone Density   Lipid panel   TSH   Hemoglobin A1c   Magnesium    Over 30 minutes of exam, counseling, chart review, and critical decision making was performed Future Appointments  Date Time Provider [place]  09/23/2021  8:15 AM [facility] [place] None  09/23/2021  9:00 AM Heath Lark, MD [facility] None  09/23/2021 10:00 AM [facility] INFUSION [facility] None  09/24/2021 11:30 AM Rush Landmark, RPH [facility] None  10/07/2021  9:30 AM [facility] [place] FLUSH [facility] None  10/07/2021 10:00 AM Heath Lark, MD [facility] None  10/07/2021 10:45 AM [facility] INFUSION [facility] None  02/21/2022 10:00 AM Liane Comber, NP [facility] None  09/12/2022 11:00 AM Liane Comber, NP [facility] None    Plan:   During the course of the visit the patient was educated and counseled about appropriate screening and preventive services including:   Pneumococcal vaccine  Influenza vaccine Td vaccine Prevnar 13 Screening electrocardiogram Screening mammography Bone densitometry screening Colorectal cancer screening Diabetes screening Glaucoma screening Nutrition counseling  Advanced directives: given info/requested copies    Subjective:   Kayla Baker is a 85 y.o. female who presents for AWV and follow up. She has Essential hypertension; Hyperlipidemia associated with type 2 diabetes mellitus ([place]); T2_NIDDM w/CKD3 ([place]); Vitamin D deficiency; Medication management; Diabetic neuropathy, painful  ([place]); Gout;  Breast cancer of upper-inner quadrant of left female breast ([place]); Chemotherapy-induced peripheral neuropathy ([place]); Port catheter in place; CKD stage 3 due to type 2 diabetes mellitus ([place]); Edema of right lower leg; Ovarian cancer ([place]); Goals of care, counseling/discussion; Anemia in neoplastic disease; Pancytopenia, acquired ([place]); Cancer associated pain; Protein-calorie malnutrition, moderate ([place]); Genetic testing; Deficiency anemia; Mild nonproliferative diabetic retinopathy of both eyes without macular edema associated with type 2 diabetes mellitus ([place]); Osteopenia; Elevated liver enzymes; Aortic atherosclerosis ([place]) by Abd CT scan on 05/10/2021; Nodule of lower lobe of right lung; Poor dentition; and Peritoneal metastases ([place]) on their problem list.  She is married, no children. 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 She enjoys spending time with family.   In 2021 she was unfortunately diagnosed with large R sided ovarian tumor with consequent RLE edema, urinary incontinence (wearing depends, improved), had port cath placed and started chemotherapy managed by Dr. Heath Lark, s/p resction by Dr[facility] Everitt Amber [facility] on 10/18/2020. Chemotherapy with some intolerance, pancytopenia/anemia that have required 2 transfusions and delayed some treatments, also some elevated LFTs. She unfortunately has known peritoneal metastasis/progression per recent CT 08/28/2021. She has follow up planned with Dr. [name] next Monday. Patient states she is not ready to give up, goal to live past the holidays. Overall she reports she feels well and in good spirits.   Other significant current problems include hx/o Triple (+) Lt Breast Ca w/ Partial Lt Mastectomy  in April 2017 followed by Chemo tx 5-07/2016 then Radiation in Oct; she completed 4 years of anastrozole, was d/c'd due to ovarian cancer. Last mammogram 03/2021.   Patient also has gout and GERD well  controlled by medications.   BMI is Body mass index is 24.2 kg/m., she has been working on diet, pushing fruits/vegetables, watching white carbs; she does have [employer] membership but hasn't been going since the pandemic. She is very active with housework and yardwork.  Wt Readings from Last 3 Encounters:  09/19/21 141 lb (64 kg)  09/12/21 140 lb 12.8 oz (63.9 kg)  09/05/21 143 lb 8 oz (65.1 kg)   Her blood pressure has been controlled at home, today their BP is BP: 102/60 She does not workout. She denies chest pain, shortness of breath, dizziness.   She has aortic atherosclerosis per CT 05/2021.   She is on cholesterol medication (on zetia, hx of LFT elevation with statin, was on rosuvastatin 5 mg three days a week but holding while on chemotherapy per oncology) and denies myalgias. Her cholesterol is not at goal. The cholesterol last visit was:   Lab Results  Component Value Date   CHOL 142 06/11/2021   HDL 35 (L) 06/11/2021   LDLCALC 82 06/11/2021   TRIG 152 (H) 06/11/2021   CHOLHDL 4.1 06/11/2021   She has been working on diet and exercise for T2 diabetes (off of metformin due to CKD, glimepiride 1 mg PRN AM if fasting 150+), and denies foot ulcerations, hyperglycemia, hypoglycemia , increased appetite, nausea, polydipsia, polyuria, visual disturbances, vomiting and weight loss.She reports that her blood sugars have been running 91-121. She does have some mild neuropathy in her feet, on gabapentin.    Last A1C in the office was:  Lab Results  Component Value Date   HGBA1C 6.4 (H) 06/11/2021   CKD III associated with T2DM; followed by Dr. Hollie Salk; last GFR:  Lab Results  Component Value Date   GFRNONAA 52 (L) 09/12/2021   GFRNONAA 38 (  L) 09/04/2021   GFRNONAA 32 (L) 08/23/2021   Patient is on Vitamin D supplement, taking 5000 IU daily. Lab Results  Component Value Date   VD25OH 66 06/11/2021     Patient is on allopurinol for gout and does not report a recent flare.  Lab Results   Component Value Date   LABURIC 5.3 06/11/2021   Hem/onc and nephrology also following chronic anemia closely; had normal iron 10/2020, B12 05/2020.  CBC Latest Ref Rng & Units 09/12/2021 09/04/2021 08/23/2021  WBC 4.0 - 10.5 K/uL 2.7(L) 5.1 4.6  Hemoglobin 12.0 - 15.0 g/dL 10.4(L) 10.9(L) 10.9(L)  Hematocrit 36.0 - 46.0 % 30.7(L) 32.5(L) 33.0(L)  Platelets 150 - 400 K/uL 51(L) 105(L) 89(L)     Medication Review  Current Outpatient Medications (Endocrine & Metabolic):    glimepiride (AMARYL) 1 MG tablet, Take 1 tab by mouth in the morning if fasting is running 150+. Please do not take this medication if you are ill or not eating as this can cause a low blood sugar.  Current Outpatient Medications (Cardiovascular):    ezetimibe (ZETIA) 10 MG tablet, Patient knows to take by mouth  !   - Thanks   hydrochlorothiazide (HYDRODIURIL) 25 MG tablet, TAKE ONE TABLET BY MOUTH DAILY FOR FOR BLOOD PRESSURE AND FLUID RETENTION/ ANKLE SWELLING   metoprolol succinate (TOPROL-XL) 25 MG 24 hr tablet, Take  1 tablet  Daily  for BP       /TAKE 1 TABLET BY MOUTH DAILY FOR BLOOD PRESSURE   rosuvastatin (CRESTOR) 5 MG tablet, Take 1 tablet (5 mg total) by mouth 3 (three) times a week. In the evening for cholesterol.  Current Outpatient Medications (Respiratory):    loratadine (CLARITIN) 10 MG tablet, Take 10 mg by mouth daily as needed for allergies.  Current Outpatient Medications (Analgesics):    acetaminophen (TYLENOL) 325 MG tablet, Take 325 mg by mouth every 6 (six) hours as needed for headache.   allopurinol (ZYLOPRIM) 300 MG tablet, TAKE 1 TABLET BY MOUTH DAILY FOR GOUT PREVENTION  Current Outpatient Medications (Hematological):    cyanocobalamin 1000 MCG tablet, Take 1,000 mcg by mouth daily.  Current Outpatient Medications (Other):    blood glucose meter kit and supplies KIT, Dispense based on patient and insurance preference. Use to check blood sugar once daily. Dx: E11.22, N18.30    Cholecalciferol (VITAMIN D3) 5000 units CAPS, Take 5,000 Units by mouth daily.   gabapentin (NEURONTIN) 300 MG capsule, Take 1 capsule in the morning and afternoon and 2 capsules at night   glucose blood (FREESTYLE TEST STRIPS) test strip, USE TO CHECK BLOOD GLUCOSE ONCE DAILY   Lancets (FREESTYLE) lancets, CHECK BLOOD SUGAR DAILY AS DIRECTED   lidocaine-prilocaine (EMLA) cream, Apply to affected area once   Magnesium 250 MG TABS, Take 250 mg by mouth every other day.   omeprazole (PRILOSEC) 20 MG capsule, Take 1 capsule (20 mg total) by mouth daily.   ondansetron (ZOFRAN) 8 MG tablet, Take 1 tablet (8 mg total) by mouth 2 (two) times daily as needed for refractory nausea / vomiting. Start on day 3 after carboplatin chemo.   prochlorperazine (COMPAZINE) 10 MG tablet, Take 1 tablet (10 mg total) by mouth every 6 (six) hours as needed for nausea or vomiting.   vitamin C (ASCORBIC ACID) 500 MG tablet, Take 500 mg by mouth every evening.    PFIZER-BIONT COVID-19 VAC-TRIS SUSP injection,   Current Problems (verified) Patient Active Problem List   Diagnosis Date Noted   Peritoneal  metastases ([place]) 09/18/2021   Poor dentition 07/26/2021   Nodule of lower lobe of right lung 06/27/2021   Aortic atherosclerosis ([place]) by Abd CT scan on 05/10/2021 06/10/2021   Elevated liver enzymes 03/12/2021   Mild nonproliferative diabetic retinopathy of both eyes without macular edema associated with type 2 diabetes mellitus ([place]) 02/20/2021   Osteopenia 02/20/2021   Deficiency anemia 11/02/2020   Genetic testing 09/14/2020   Protein-calorie malnutrition, moderate ([place]) 06/05/2020   Pancytopenia, acquired ([place]) 05/15/2020   Cancer associated pain 05/15/2020   Anemia in neoplastic disease 05/03/2020   Goals of care, counseling/discussion 04/20/2020   Ovarian cancer ([place]) 04/19/2020   Edema of right lower leg 04/05/2020   CKD stage 3 due to type 2 diabetes mellitus ([place]) 07/05/2018   Port catheter in place  09/18/2016   Chemotherapy-induced peripheral neuropathy ([place]) 08/07/2016   Breast cancer of upper-inner quadrant of left female breast ([place]) 02/29/2016   Gout 12/08/2014   Diabetic neuropathy, painful ([place]) 08/28/2014   Medication management 08/27/2014   Essential hypertension 11/10/2013   Hyperlipidemia associated with type 2 diabetes mellitus ([place]) 11/10/2013   T2_NIDDM w/CKD3 ([place]) 11/10/2013   Vitamin D deficiency 11/10/2013    Screening Tests Immunization History  Administered Date(s) Administered   DTaP 07/01/2004   Influenza Whole 08/15/2013   Influenza, High Dose Seasonal PF 08/28/2014, 09/20/2015, 08/19/2017, 09/01/2018, 07/28/2019   Influenza-Unspecified 08/04/2020, 08/15/2021   PFIZER(Purple Top)SARS-COV-2 Vaccination 12/12/2019, 12/31/2019, 08/03/2020   Pneumococcal Conjugate-13 09/20/2015   Pneumococcal Polysaccharide-23 11/01/2009   Td 08/10/2009, 04/11/2020   Zoster, Live 08/02/2011    Preventative care: Last colonoscopy: 2019 will not need another Last mammogram: 03/2021 Last pap smear/pelvic exam: remotely DEXA: 05/2019 - 1.6 osteopenia, plan to get in 2023 with mammogram   Prior vaccinations: TD or Tdap: 03/2020 Influenza: 08/25/2021  Pneumococcal: 2010 Prevnar13: 2016  Shingles/Zostavax: 2012 discussed will get eventually, had first shingrix was advised not to get the second by oncologist COVID complete 2021  Names of Other Physician/Practitioners you currently use: 1. [facility]- here for primary care 2. Dr. Nicki Reaper, eye doctor, last visit 09/18/2021 - bil mild retinopathy last year, report requested 3. Dr. [place] Sink, dentist, last visit 2022  Patient Care Team: [name], MD as PCP - Kinnie Scales, [place] as Referring Physician (Optometry) [name], MD as Consulting Physician (Dermatology) Latanya Maudlin, MD as Consulting Physician (Orthopedic Surgery) Inda Castle, MD (Inactive) as Consulting  Physician (Gastroenterology) Awanda Mink Craige Cotta, RN as Oncology Nurse Navigator (Oncology) Heath Lark, MD as Consulting Physician (Hematology and Oncology) Madelon Lips, MD as Consulting Physician (Nephrology) Rush Landmark, [facility] as Pharmacist (Pharmacist)  Allergies Allergies  Allergen Reactions   Keflex [Cephalexin] Swelling    Tongue and throat swells   Meloxicam Swelling   Prednisone Swelling and Other (See Comments)    Can Not take High doses Cannot tolerate high doses Can Not take High doses   Premarin [Estrogens Conjugated] Hives   Trovan [Alatrofloxacin] Other (See Comments)    Unknown reaction    SURGICAL HISTORY She  has a past surgical history that includes Cataract extraction, bilateral (2014); Abdominal hysterectomy (1973); Incontinence surgery (2006); Knee arthroscopy (Right); Hand surgery (Left, 2012); Colonoscopy; Radioactive seed guided mastectomy with axillary sentinel lymph node biopsy (Left, 03/18/2016); Cardiac catheterization; Portacath placement (N/A, 04/14/2016); Port-a-cath removal (N/A, 11/27/2016); Mastopexy (Right, 06/16/2017); Breast biopsy (Left, 02/26/2016); Breast lumpectomy (Left, 03/18/2016); Reduction mammaplasty (Right, 2018); IR IMAGING GUIDED PORT INSERTION (04/24/2020); IR Paracentesis (05/08/2020); IR Paracentesis (05/16/2020); and IR  Paracentesis (05/25/2020).   FAMILY HISTORY Her family history includes Alzheimer's disease in her father; Breast cancer (age of onset: 59) in her sister; Cirrhosis in her sister; Diabetes in her father; Epilepsy in some other family members; Esophageal cancer (age of onset: 84) in her maternal aunt; Heart Problems in her maternal grandmother; Heart attack (age of onset: 38) in her maternal uncle; Hypertension in her sister; Lung cancer in an other family member; Lung cancer (age of onset: 25) in her sister; Other in her mother and sister; Other (age of onset: 57) in an other family member; Pancreatic cancer (age of onset: 14) in  her cousin; Parkinson's disease in her maternal aunt; Stroke in her maternal grandmother; Stroke (age of onset: 11) in her mother.   SOCIAL HISTORY She  reports that she has never smoked. She has never used smokeless tobacco. She reports that she does not drink alcohol and does not use drugs.  MEDICARE WELLNESS OBJECTIVES: Physical activity: Current Exercise Habits: Home exercise routine, Type of exercise: walking, Time (Minutes): 10, Frequency (Times/Week): 3, Weekly Exercise (Minutes/Week): 30, Intensity: Mild, Exercise limited by: None identified Cardiac risk factors: Cardiac Risk Factors include: advanced age (>36mn, >>66women);dyslipidemia;hypertension;diabetes mellitus;sedentary lifestyle Depression/mood screen:   [facility]  Decreased Interest 0  Down, Depressed, Hopeless 1  PHQ - 2 Score 1  Some recent data might be hidden    ADLs:  In your present state of health, do you have any difficulty performing the following activities: 09/19/2021  Hearing? N  Vision? N  Difficulty concentrating or making decisions? N  Walking or climbing stairs? N  Dressing or bathing? N  Doing errands, shopping? N  Some recent data might be hidden     Cognitive Testing  Alert? Yes  Normal Appearance?Yes  Oriented to person? Yes  Place? Yes   Time? Yes  Recall of three objects?  Yes  Can perform simple calculations? Yes  Displays appropriate judgment?Yes  Can read the correct time from a watch face?Yes  EOL planning: Does Patient Have a Medical Advance Directive?: No Would patient like information on creating a medical advance directive?: No - Patient declined     Review of Systems  Constitutional:  Negative for malaise/fatigue and weight loss.  HENT:  Negative for hearing loss and tinnitus.   Eyes:  Negative for blurred vision and double vision.  Respiratory:  Negative for cough, sputum production, shortness of breath and wheezing.   Cardiovascular:  Positive  for leg swelling (RLE much improved). Negative for chest pain, palpitations, orthopnea, claudication and PND.  Gastrointestinal:  Positive for abdominal pain (intermittent brief RLQ with movement and deep palpation). Negative for blood in stool, constipation, diarrhea, heartburn, melena, nausea and vomiting.  Genitourinary: Negative.   Musculoskeletal:  Negative for falls, joint pain and myalgias.  Skin:  Negative for rash.  Neurological:  Positive for sensory change (bil feet/ankles, chronic following chemo). Negative for dizziness, tingling, weakness and headaches.  Endo/Heme/Allergies:  Negative for polydipsia.  Psychiatric/Behavioral: Negative.  Negative for depression, memory loss, substance abuse and suicidal ideas. The patient is not nervous/anxious and does not have insomnia.   All other systems reviewed and are negative.   Objective:   Today's Vitals   09/19/21 1021  BP: 102/60  Pulse: 78  Temp: (!) 97.2 F (36.2 C)  SpO2: 99%  Weight: 141 lb (64 kg)   Body mass index is 24.2 kg/m.  General appearance: alert, no distress, WD/WN, elder female HEENT: normocephalic, sclerae anicteric,  TMs pearly, nares patent, no discharge or erythema, pharynx normal Oral cavity: MMM, no lesions Neck: supple, no lymphadenopathy, no thyromegaly, no masses Heart: RRR, normal S1, S2, no murmurs Lungs: CTA bilaterally, no wheezes, rhonchi, or rales Abdomen: +bs, soft, non tender, non distended, no masses, no hepatomegaly, no splenomegaly Musculoskeletal: nontender, no swelling, no obvious deformity Extremities: LLE without edema, RLE with scant swelling around ankle, no cyanosis, no clubbing Pulses: 2+ symmetric, upper and lower extremities, normal cap refill Neurological: alert, oriented x 3, CN2-12 intact, strength normal upper extremities and lower extremities, sensation normal UE, bil LE diminished through feet and ankles to monofilament and light touch, DTRs 2+ throughout, no cerebellar  signs, gait slow steady Psychiatric: normal affect, behavior normal, pleasant  Gyn: defer to GYN/onc   Medicare Attestation I have personally reviewed: The patient's medical and social history Their use of alcohol, tobacco or illicit drugs Their current medications and supplements The patient's functional ability including ADLs,fall risks, home safety risks, cognitive, and hearing and visual impairment Diet and physical activities Evidence for depression or mood disorders  The patient's weight, height, BMI, and visual acuity have been recorded in the chart.  I have made referrals, counseling, and provided education to the patient based on review of the above and I have provided the patient with a written personalized care plan for preventive services.     Izora Ribas, NP   09/19/2021

## 2021-09-19 ENCOUNTER — Ambulatory Visit (INDEPENDENT_AMBULATORY_CARE_PROVIDER_SITE_OTHER): Payer: PPO | Admitting: Adult Health

## 2021-09-19 ENCOUNTER — Other Ambulatory Visit: Payer: Self-pay

## 2021-09-19 ENCOUNTER — Encounter: Payer: Self-pay | Admitting: Adult Health

## 2021-09-19 VITALS — BP 102/60 | HR 78 | Temp 97.2°F | Wt 141.0 lb

## 2021-09-19 DIAGNOSIS — N183 Chronic kidney disease, stage 3 unspecified: Secondary | ICD-10-CM | POA: Diagnosis not present

## 2021-09-19 DIAGNOSIS — E1169 Type 2 diabetes mellitus with other specified complication: Secondary | ICD-10-CM | POA: Diagnosis not present

## 2021-09-19 DIAGNOSIS — R911 Solitary pulmonary nodule: Secondary | ICD-10-CM

## 2021-09-19 DIAGNOSIS — Z170 Estrogen receptor positive status [ER+]: Secondary | ICD-10-CM

## 2021-09-19 DIAGNOSIS — I10 Essential (primary) hypertension: Secondary | ICD-10-CM | POA: Diagnosis not present

## 2021-09-19 DIAGNOSIS — E113293 Type 2 diabetes mellitus with mild nonproliferative diabetic retinopathy without macular edema, bilateral: Secondary | ICD-10-CM | POA: Diagnosis not present

## 2021-09-19 DIAGNOSIS — Z0001 Encounter for general adult medical examination with abnormal findings: Secondary | ICD-10-CM | POA: Diagnosis not present

## 2021-09-19 DIAGNOSIS — Z79899 Other long term (current) drug therapy: Secondary | ICD-10-CM

## 2021-09-19 DIAGNOSIS — Z0000 Encounter for general adult medical examination without abnormal findings: Secondary | ICD-10-CM

## 2021-09-19 DIAGNOSIS — E1122 Type 2 diabetes mellitus with diabetic chronic kidney disease: Secondary | ICD-10-CM

## 2021-09-19 DIAGNOSIS — E785 Hyperlipidemia, unspecified: Secondary | ICD-10-CM

## 2021-09-19 DIAGNOSIS — C569 Malignant neoplasm of unspecified ovary: Secondary | ICD-10-CM

## 2021-09-19 DIAGNOSIS — T451X5A Adverse effect of antineoplastic and immunosuppressive drugs, initial encounter: Secondary | ICD-10-CM

## 2021-09-19 DIAGNOSIS — R6889 Other general symptoms and signs: Secondary | ICD-10-CM

## 2021-09-19 DIAGNOSIS — D61818 Other pancytopenia: Secondary | ICD-10-CM | POA: Diagnosis not present

## 2021-09-19 DIAGNOSIS — E1140 Type 2 diabetes mellitus with diabetic neuropathy, unspecified: Secondary | ICD-10-CM | POA: Diagnosis not present

## 2021-09-19 DIAGNOSIS — C50212 Malignant neoplasm of upper-inner quadrant of left female breast: Secondary | ICD-10-CM

## 2021-09-19 DIAGNOSIS — I700 Atherosclerosis of aorta: Secondary | ICD-10-CM

## 2021-09-19 DIAGNOSIS — M1000 Idiopathic gout, unspecified site: Secondary | ICD-10-CM

## 2021-09-19 DIAGNOSIS — G620 Drug-induced polyneuropathy: Secondary | ICD-10-CM

## 2021-09-19 DIAGNOSIS — C786 Secondary malignant neoplasm of retroperitoneum and peritoneum: Secondary | ICD-10-CM | POA: Diagnosis not present

## 2021-09-19 DIAGNOSIS — E559 Vitamin D deficiency, unspecified: Secondary | ICD-10-CM

## 2021-09-19 DIAGNOSIS — M8580 Other specified disorders of bone density and structure, unspecified site: Secondary | ICD-10-CM

## 2021-09-19 NOTE — Patient Instructions (Addendum)
Goals      DIET - EAT MORE FRUITS AND VEGETABLES     Exercise 150 min/wk Moderate Activity     HEMOGLOBIN A1C < 7     LDL CALC < 70        A great goal to work towards is aiming to get in a serving daily of some of the most nutritionally dense foods - G- BOMBS daily

## 2021-09-20 LAB — LIPID PANEL
Cholesterol: 133 mg/dL (ref <200)
HDL: 49 mg/dL — ABNORMAL LOW (ref >=50)
LDL Cholesterol (Calc): 66 mg/dL (calc)
Non-HDL Cholesterol (Calc): 84 mg/dL (calc) (ref <130)
Total CHOL/HDL Ratio: 2.7 (calc) (ref <5.0)
Triglycerides: 99 mg/dL (ref <150)

## 2021-09-20 LAB — HEMOGLOBIN A1C
Hgb A1c MFr Bld: 6.6 % of total Hgb — ABNORMAL HIGH (ref <5.7)
Mean Plasma Glucose: 143 mg/dL
eAG (mmol/L): 7.9 mmol/L

## 2021-09-20 LAB — TSH: TSH: 2.7 mIU/L (ref 0.40–4.50)

## 2021-09-20 LAB — MAGNESIUM: Magnesium: 1.4 mg/dL — ABNORMAL LOW (ref 1.5–2.5)

## 2021-09-23 ENCOUNTER — Encounter: Payer: Self-pay | Admitting: Hematology and Oncology

## 2021-09-23 ENCOUNTER — Inpatient Hospital Stay: Payer: PPO

## 2021-09-23 ENCOUNTER — Inpatient Hospital Stay: Payer: PPO | Admitting: Hematology and Oncology

## 2021-09-23 ENCOUNTER — Other Ambulatory Visit: Payer: Self-pay

## 2021-09-23 ENCOUNTER — Other Ambulatory Visit: Payer: Self-pay | Admitting: Hematology and Oncology

## 2021-09-23 DIAGNOSIS — C50212 Malignant neoplasm of upper-inner quadrant of left female breast: Secondary | ICD-10-CM

## 2021-09-23 DIAGNOSIS — C569 Malignant neoplasm of unspecified ovary: Secondary | ICD-10-CM

## 2021-09-23 DIAGNOSIS — Z95828 Presence of other vascular implants and grafts: Secondary | ICD-10-CM

## 2021-09-23 DIAGNOSIS — Z170 Estrogen receptor positive status [ER+]: Secondary | ICD-10-CM

## 2021-09-23 DIAGNOSIS — D61818 Other pancytopenia: Secondary | ICD-10-CM | POA: Diagnosis not present

## 2021-09-23 DIAGNOSIS — Z5111 Encounter for antineoplastic chemotherapy: Secondary | ICD-10-CM | POA: Diagnosis not present

## 2021-09-23 LAB — CBC WITH DIFFERENTIAL/PLATELET
Abs Immature Granulocytes: 0.01 10*3/uL (ref 0.00–0.07)
Basophils Absolute: 0 10*3/uL (ref 0.0–0.1)
Basophils Relative: 0 %
Eosinophils Absolute: 0 10*3/uL (ref 0.0–0.5)
Eosinophils Relative: 1 %
HCT: 29.2 % — ABNORMAL LOW (ref 36.0–46.0)
Hemoglobin: 9.9 g/dL — ABNORMAL LOW (ref 12.0–15.0)
Immature Granulocytes: 0 %
Lymphocytes Relative: 34 %
Lymphs Abs: 0.9 10*3/uL (ref 0.7–4.0)
MCH: 31.2 pg (ref 26.0–34.0)
MCHC: 33.9 g/dL (ref 30.0–36.0)
MCV: 92.1 fL (ref 80.0–100.0)
Monocytes Absolute: 0.3 10*3/uL (ref 0.1–1.0)
Monocytes Relative: 13 %
Neutro Abs: 1.3 10*3/uL — ABNORMAL LOW (ref 1.7–7.7)
Neutrophils Relative %: 52 %
Platelets: 92 10*3/uL — ABNORMAL LOW (ref 150–400)
RBC: 3.17 MIL/uL — ABNORMAL LOW (ref 3.87–5.11)
RDW: 15.3 % (ref 11.5–15.5)
WBC: 2.5 10*3/uL — ABNORMAL LOW (ref 4.0–10.5)
nRBC: 0 % (ref 0.0–0.2)

## 2021-09-23 LAB — CMP (CANCER CENTER ONLY)
ALT: 20 U/L (ref 0–44)
AST: 33 U/L (ref 15–41)
Albumin: 3.7 g/dL (ref 3.5–5.0)
Alkaline Phosphatase: 90 U/L (ref 38–126)
Anion gap: 7 (ref 5–15)
BUN: 21 mg/dL (ref 8–23)
CO2: 28 mmol/L (ref 22–32)
Calcium: 9.5 mg/dL (ref 8.9–10.3)
Chloride: 104 mmol/L (ref 98–111)
Creatinine: 1.25 mg/dL — ABNORMAL HIGH (ref 0.44–1.00)
GFR, Estimated: 43 mL/min — ABNORMAL LOW (ref >60)
Glucose, Bld: 140 mg/dL — ABNORMAL HIGH (ref 70–99)
Potassium: 3.5 mmol/L (ref 3.5–5.1)
Sodium: 139 mmol/L (ref 135–145)
Total Bilirubin: 0.5 mg/dL (ref 0.3–1.2)
Total Protein: 7 g/dL (ref 6.5–8.1)

## 2021-09-23 MED ORDER — SODIUM CHLORIDE 0.9% FLUSH
10.0000 mL | INTRAVENOUS | Status: DC | PRN
  Administered 2021-09-23: 10 mL

## 2021-09-23 MED ORDER — PROCHLORPERAZINE MALEATE 10 MG PO TABS
10.0000 mg | ORAL_TABLET | Freq: Once | ORAL | Status: AC
  Administered 2021-09-23: 10 mg via ORAL
  Filled 2021-09-23: qty 1

## 2021-09-23 MED ORDER — SODIUM CHLORIDE 0.9 % IV SOLN: Freq: Once | INTRAVENOUS | Status: AC

## 2021-09-23 MED ORDER — HEPARIN SOD (PORK) LOCK FLUSH 100 UNIT/ML IV SOLN
500.0000 [IU] | Freq: Once | INTRAVENOUS | Status: AC | PRN
  Administered 2021-09-23: 500 [IU]

## 2021-09-23 MED ORDER — SODIUM CHLORIDE 0.9% FLUSH
10.0000 mL | Freq: Once | INTRAVENOUS | Status: AC
  Administered 2021-09-23: 10 mL

## 2021-09-23 MED ORDER — SODIUM CHLORIDE 0.9 % IV SOLN
500.0000 mg/m2 | Freq: Once | INTRAVENOUS | Status: AC
  Administered 2021-09-23: 836 mg via INTRAVENOUS
  Filled 2021-09-23: qty 21.99

## 2021-09-23 NOTE — Assessment & Plan Note (Signed)
This is due to side effects of chemotherapy She does not need transfusion support We will space out her appointment as above

## 2021-09-23 NOTE — Progress Notes (Signed)
[place] OFFICE PROGRESS NOTE  Patient Care Team: [name], MD as PCP - Kinnie Scales, OD as Referring Physician (Optometry) [name], MD as Consulting Physician (Dermatology) Latanya Maudlin, MD as Consulting Physician (Orthopedic Surgery) Inda Castle, MD (Inactive) as Consulting Physician (Gastroenterology) Jacqulyn Liner, RN as Oncology Nurse Navigator (Oncology) Heath Lark, MD as Consulting Physician (Hematology and Oncology) Madelon Lips, MD as Consulting Physician (Nephrology) Rush Landmark, [facility] as Pharmacist (Pharmacist)  ASSESSMENT & PLAN:  Ovarian cancer [facility]) Her pancytopenia has improved since she had extra week of break We will proceed with treatment today with minor dose reduction for the I recommend minimum 3 cycles of treatment before repeating imaging study In the future, her treatment cycle will be every other week  Pancytopenia, acquired [facility]) This is due to side effects of chemotherapy She does not need transfusion support We will space out her appointment as above  No orders of the defined types were placed in this encounter.   All questions were answered. The patient knows to call the clinic with any problems, questions or concerns. The total time spent in the appointment was 20 minutes encounter with patients including review of chart and various tests results, discussions about plan of care and coordination of care plan   Heath Lark, MD 09/23/2021 9:31 AM  INTERVAL HISTORY: Please see below for problem oriented charting. she returns for treatment follow-up with her husband, for cycle 1 day 8 of treatment She have no recent bleeding complications from treatment Denies abdominal pain or changes in bowel habits  REVIEW OF SYSTEMS:   Constitutional: Denies fevers, chills or abnormal weight loss Eyes: Denies blurriness of vision Ears, nose, mouth, throat, and face: Denies mucositis or sore throat Respiratory:  Denies cough, dyspnea or wheezes Cardiovascular: Denies palpitation, chest discomfort or lower extremity swelling Gastrointestinal:  Denies nausea, heartburn or change in bowel habits Skin: Denies abnormal skin rashes Lymphatics: Denies new lymphadenopathy or easy bruising Neurological:Denies numbness, tingling or new weaknesses Behavioral/Psych: Mood is stable, no new changes  All other systems were reviewed with the patient and are negative.  I have reviewed the past medical history, past surgical history, social history and family history with the patient and they are unchanged from previous note.  ALLERGIES:  is allergic to keflex [cephalexin], meloxicam, prednisone, premarin [estrogens conjugated], and trovan [alatrofloxacin].  MEDICATIONS:  Current Outpatient Medications  Medication Sig Dispense Refill   acetaminophen (TYLENOL) 325 MG tablet Take 325 mg by mouth every 6 (six) hours as needed for headache.     allopurinol (ZYLOPRIM) 300 MG tablet TAKE 1 TABLET BY MOUTH DAILY FOR GOUT PREVENTION 90 tablet 1   blood glucose meter kit and supplies KIT Dispense based on patient and insurance preference. Use to check blood sugar once daily. Dx: E11.22, N18.30 1 each 12   Cholecalciferol (VITAMIN D3) 5000 units CAPS Take 5,000 Units by mouth daily.     cyanocobalamin 1000 MCG tablet Take 1,000 mcg by mouth daily.     ezetimibe (ZETIA) 10 MG tablet Patient knows to take by mouth  !   - Thanks 90 tablet 3   gabapentin (NEURONTIN) 300 MG capsule Take 1 capsule in the morning and afternoon and 2 capsules at night 360 capsule 3   glimepiride (AMARYL) 1 MG tablet Take 1 tab by mouth in the morning if fasting is running 150+. Please do not take this medication if you are ill or not eating as this can cause a low  blood sugar. 90 tablet 1   glucose blood (FREESTYLE TEST STRIPS) test strip USE TO CHECK BLOOD GLUCOSE ONCE DAILY 100 strip 3   hydrochlorothiazide (HYDRODIURIL) 25 MG tablet TAKE ONE TABLET  BY MOUTH DAILY FOR FOR BLOOD PRESSURE AND FLUID RETENTION/ ANKLE SWELLING 90 tablet 1   Lancets (FREESTYLE) lancets CHECK BLOOD SUGAR DAILY AS DIRECTED 100 each 3   lidocaine-prilocaine (EMLA) cream Apply to affected area once 30 g 3   loratadine (CLARITIN) 10 MG tablet Take 10 mg by mouth daily as needed for allergies.     Magnesium 250 MG TABS Take 250 mg by mouth every other day.     metoprolol succinate (TOPROL-XL) 25 MG 24 hr tablet Take  1 tablet  Daily  for BP       /TAKE 1 TABLET BY MOUTH DAILY FOR BLOOD PRESSURE 90 tablet 3   omeprazole (PRILOSEC) 20 MG capsule Take 1 capsule (20 mg total) by mouth daily. 30 capsule 11   ondansetron (ZOFRAN) 8 MG tablet Take 1 tablet (8 mg total) by mouth 2 (two) times daily as needed for refractory nausea / vomiting. Start on day 3 after carboplatin chemo. 30 tablet 1   PFIZER-BIONT COVID-19 VAC-TRIS SUSP injection      prochlorperazine (COMPAZINE) 10 MG tablet Take 1 tablet (10 mg total) by mouth every 6 (six) hours as needed for nausea or vomiting. 60 tablet 9   rosuvastatin (CRESTOR) 5 MG tablet Take 1 tablet (5 mg total) by mouth 3 (three) times a week. In the evening for cholesterol. 36 tablet 3   vitamin C (ASCORBIC ACID) 500 MG tablet Take 500 mg by mouth every evening.      No current facility-administered medications for this visit.   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[place])  02/26/2016  Initial Diagnosis   Left breast biopsy 11:00 position: invasive ductal carcinoma with DCIS, ER 90%, PR 10%, HER-2 negative, Ki-67 30%, grade 2, 2.2 cm palpable lesion T2 N0 stage II a clinical stage   03/18/2016 Surgery   Left lumpectomy: Invasive ductal carcinoma, grade 2, 6.3 cm, with high-grade DCIS, margins negative, 0/4 lymph nodes negative, ER 90%, via 10%, HER-2 negative ratio 0.97, Ki-67 30%, T3 N0 stage IIB   03/25/2016 Procedure   Genetic testing is negative for pathogenic mutations within any of the 20 Genes on the breast/ovarian cancer panel   04/04/2016 Oncotype testing   Oncotype DX recurrence score 37, 25% 10 year distant risk of recurrence   04/17/2016 - 07/31/2016 Chemotherapy   Adjuvant chemotherapy with dose dense Adriamycin and Cytoxan followed by Abraxane weekly 8 ( discontinued due to neuropathy)   09/01/2016 - 09/26/2016 Radiation Therapy   Adj XRT 1) Left breast: 42.5 Gy in 17 fractions. 2) Left breast boost: 7.5 Gy in 3 fractions.   12/02/2016 -  Anti-estrogen oral therapy   Anastrozole 1  mg daily   09/05/2021 -  Chemotherapy   Patient is on Treatment Plan : OVARIAN RECURRENT 3RD LINE Carboplatin D1 / Gemcitabine D1,8 (4/800) q21d     Ovarian cancer ([place])  04/03/2020 Imaging   US pelvis Complex cystic and solid mass in LEFT adnexa 12.5 cm diameter question cystic ovarian neoplasm; recommend correlation with serum tumor markers and further evaluation by MR imaging with and without contrast.     04/04/2020 Imaging   US venous Doppler No evidence of deep venous thrombosis in the right lower extremity. Left common femoral vein also patent.   04/15/2020 Imaging   MRI pelvis 1. Large complex solid and cystic mass arising from the right adnexa measuring 10.8 by 11.5 by 11.1 cm. This has an aggressive appearance with extensive enhancing mural soft tissue components. Findings are highly suspicious for malignant ovarian neoplasm. 2. Extensive bilateral retroperitoneal and  bilateral iliac adenopathy compatible with metastatic disease. 3. Signs of extensive peritoneal carcinomatosis including ascites, enhancement, thickening and nodularity of the peritoneal reflections, omental caking and bulky peritoneal nodularity. 4. Suspected serosal involvement of the dome of bladder with loss of normal fat plane. Cannot rule out mural invasion by tumor.   04/17/2020 Tumor Marker   Patient's tumor was tested for the following markers: CA-125 Results of the tumor marker test revealed 1762.   04/20/2020 Cancer Staging   Staging form: Ovary, Fallopian Tube, and Primary Peritoneal Carcinoma, AJCC 8th Edition - Clinical: FIGO Stage IIIC (cT3, cN1, cM0) - Signed by Heath Lark, MD on 04/20/2020    04/23/2020 Imaging   1. Redemonstrated dominant mixed solid and cystic mass arising from the vicinity of the right ovary measuring at least 11.5 x 10.7 cm, not significantly changed compared to prior MR, and consistent with primary ovarian malignancy.  2. Numerous bulky retroperitoneal, bilateral iliac, and pelvic sidewall lymph nodes. 3. Moderate volume ascites throughout the abdomen and pelvis with subtle thickening and nodularity throughout the peritoneum, and extensive bulky nodular metastatic disease of the omentum. 4. Constellation of findings is consistent with advanced nodal and peritoneal metastatic disease. 5. There are prominent subcentimeter epicardial lymph nodes, nonspecific although suspicious for nodal metastatic disease. No definite nodal metastatic disease in the chest. Attention on follow-up. 6. There are multiple small subpleural nodules at the right lung base overlying the diaphragm, measuring up to 7 mm. These are generally nonspecific and less favored to represent pulmonary metastatic disease given distribution. Attention on follow-up. 7. Somewhat coarse contour of the liver, suggestive of cirrhosis, although out without overt morphologic stigmata. 8. Aortic  Atherosclerosis (ICD10-I70.0).     04/24/2020 Procedure   Successful placement of a right internal jugular approach power injectable Port-A-Cath. The catheter is ready for immediate use.     05/02/2020 Tumor Marker   Patient's tumor was tested for the following markers: CA-125 Results of the tumor marker test revealed 1921   05/03/2020 - 12/12/2020 Chemotherapy   The patient had carboplatin and taxol for chemotherapy treatment.     05/08/2020 Procedure   Successful ultrasound-guided paracentesis yielding 2.6 liters of peritoneal fluid.   05/16/2020 Procedure   Successful ultrasound-guided paracentesis yielding 3.2 liters of peritoneal fluid   05/25/2020 Procedure   Successful ultrasound-guided paracentesis yielding 3 liters of peritoneal fluid.     06/01/2020 Procedure   Successful ultrasound-guided therapeutic paracentesis yielding 1.7 liters of peritoneal fluid.   06/14/2020 Tumor Marker   Patient's tumor was tested for the following markers: CA-125 Results of the tumor marker test revealed 1309  06/20/2020 Imaging   1. Dominant mixed cystic and solid mass in the central pelvis is stable in the interval. 2. Clear interval decrease in retroperitoneal and pelvic sidewall lymphadenopathy. The bulky omental disease has also clearly decreased in the interval. 3. Interval decrease in ascites. 4. Stable 8 mm subpleural nodule along the diaphragm. 5. Aortic Atherosclerosis (ICD10-I70.0).   07/10/2020 Tumor Marker   Patient's tumor was tested for the following markers: CA-125 Results of the tumor marker test revealed 220   08/03/2020 Tumor Marker   Patient's tumor was tested for the following markers: CA-125 Results of the tumor marker test revealed 53.3.   09/03/2020 Tumor Marker   Patient's tumor was tested for the following markers: CA-125 Results of the tumor marker test revealed 29.7   09/20/2020 Imaging   1. Substantial improvement. The right ovarian cystic and solid mass is half  the volume that it measured on 06/20/2020. Similar reduction in the bulk of the omental caking of tumor. Prior ascites is resolved and the retroperitoneal adenopathy is markedly improved. 2. Other imaging findings of potential clinical significance: Stable pleural-based nodularity along the right hemidiaphragm measuring about 1.1 by 0.7 by 0.3 cm. Stable hypodense lesion of the right kidney upper pole, probably a cyst although the configuration of this lesion makes it difficult to obtain accurate density measurements. Left ovarian cyst, stable. Chondrocalcinosis involving the acetabular labra. Mild lumbar spondylosis and degenerative disc disease. 3. Aortic atherosclerosis.     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 (stage ypT3c), predominantly viable with focal fibrosis and hemosiderin laden macrophages (possible mild treatment effect)  B: Ovary and fallopian tube, right, salpingo-oophorectomy - High grade serous carcinoma involving right ovary and fallopian tube with surface involvement, size up to 8 cm  - Areas of necrosis and hemosiderin laden macrophages suggestive of treatment effect - 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 - See synoptic report and comment  C: Ovary and fallopian tube, left, salpingo-oophorectomy - High grade serous carcinoma involving the left fallopian tube, size up to 0.5 cm - Ovary with no definite involvement by carcinoma identified - Nodular area of endometriosis and peritoneal inclusion cyst also present - See synoptic report and comment   Procedure:    Bilateral salpingo-oophorectomy    Procedure:    Omentectomy    Specimen Integrity of Right Ovary:    Intact with surface involvement by  tumor    Specimen Integrity of Left Ovary:    Capsule intact   TUMOR Tumor Site:    Right fallopian tube: favor as primary site; also involves right ovary and left fallopian tube  Histologic Type:    Serous carcinoma  Histologic Grade:    High grade  Tumor Size:    Greatest Dimension (Centimeters): in fallopian tube: 1.0 cm; in ovary: 8.0 cm Ovarian Surface Involvement:    Present    Laterality:    Right  Fallopian Tube Surface Involvement:    Present    Laterality:    Bilateral  Other Tissue / Organ Involvement:    Right ovary  Other Tissue / Organ Involvement:    Right fallopian tube  Other Tissue / Organ Involvement:    Left fallopian tube  Other Tissue / Organ Involvement:    Omentum  Largest Extrapelvic Peritoneal Focus:    Macroscopic (  greater than 2 cm)  Peritoneal / Ascitic Fluid:    Not submitted / unknown  Pleural Fluid:    Not submitted / unknown  Treatment Effect:    No definite or minimal response identified (chemotherapy response score [CRS] 1)   LYMPH NODES Regional Lymph Nodes:    No lymph nodes submitted or found   PATHOLOGIC STAGE CLASSIFICATION (pTNM, AJCC 8th Edition) TNM Descriptors:    y (post-treatment)  Primary Tumor (pT):    pT3c  Regional Lymph Nodes (pN):    pNX   FIGO STAGE FIGO Stage:    IIIC   ADDITIONAL FINDINGS Additional Findings:    Serous tubal intraepithelial carcinoma (STIC)  Additional Findings:    Left ovary with no definite carcinoma identified, left-sided nodule of endometriosis and peritoneal inclusion cyst    10/18/2020 - 10/20/2020 Hospital Admission   She was admitted to [place] for interval debulking surgery   11/19/2020 Tumor Marker   Patient's tumor was tested for the following markers: CA-125. Results of the tumor marker test revealed 38.3   12/12/2020 Tumor Marker   Patient's tumor was tested for the following markers CA-125. Results of the tumor marker test revealed 42.3.   01/02/2021 Tumor Marker   Patient's tumor  was tested for the following markers: CA-125. Results of the tumor marker test revealed 37.3   01/29/2021 Tumor Marker   Patient's tumor was tested for the following markers: CA-125 Results of the tumor marker test revealed 44.3   01/29/2021 Imaging   1. Interval increase in loculated appearing ascites in the low central abdomen and pelvis. 2. There is extensive peritoneal nodularity surrounding this fluid, the nodularity itself not appreciably changed in appearance compared to prior examination. 3. Additional peritoneal nodularity and/or mesenteric lymph nodes are unchanged. 4. Interval decrease in size of a low-attenuation nodule overlying the right hemidiaphragm, now measuring 1.2 cm, previously 1.8 cm when measured similarly. Findings are consistent with treatment response of a metastatic nodule. No other evidence of intrathoracic metastatic disease. 5. Status post hysterectomy, oophorectomy, and omentectomy.   01/31/2021 - 08/26/2021 Chemotherapy   She is started on Olaparib       03/04/2021 Tumor Marker   Patient's tumor was tested for the following markers: CA-125 Results of the tumor marker test revealed 39.7   04/01/2021 Tumor Marker   Patient's tumor was tested for the following markers: CA-125 Results of the tumor marker test revealed 32.3   05/10/2021 Imaging   1. No significant change in peritoneal carcinomatosis since previous study of 3 months ago, primarily in the pelvis where there is complex fluid and peritoneal nodularity. 2. No evidence of solid visceral organ metastasis. No evidence of bowel or ureteral obstruction. 3.  Aortic Atherosclerosis (ICD10-I70.0).   06/13/2021 Tumor Marker   Patient's tumor was tested for the following markers: CA-125. Results of the tumor marker test revealed 48.3.   07/29/2021 Tumor Marker   Patient's tumor was tested for the following markers: CA-125. Results of the tumor marker test revealed 54.   08/25/2021 Imaging   1. There is  extensive peritoneal soft tissue thickening and nodularity, particularly about the low right hemipelvis, which is similar to prior examination. Some small peritoneal nodules throughout the abdomen and pelvis are slightly enlarged, other nodules are unchanged. 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 2. No significant change in a subpleural nodule overlying the right hemidiaphragm. 3. Unchanged, loculated small volume pelvic ascites. 4. Status post hysterectomy, oophorectomy, and omentectomy. 5.  Coronary artery disease.   Aortic Atherosclerosis (ICD10-I70.0).     08/26/2021 Tumor Marker   Patient's tumor was tested for the following markers: CA-125. Results of the tumor marker test revealed 56.   09/04/2021 Tumor Marker   Patient's tumor was tested for the following markers: CA-125. Results of the tumor marker test revealed 55.4.   09/05/2021 -  Chemotherapy   Patient is on Treatment Plan : OVARIAN RECURRENT 3RD LINE Carboplatin D1 / Gemcitabine D1,8 (4/800) q21d       PHYSICAL EXAMINATION: ECOG PERFORMANCE STATUS: 0 - Asymptomatic  Vitals:   09/23/21 0845  BP: 133/65  Pulse: 74  Resp: 18  Temp: 98.5 F (36.9 C)  SpO2: 100%   Filed Weights   09/23/21 0845  Weight: 142 lb 3.2 oz (64.5 kg)    GENERAL:alert, no distress and comfortable NEURO: alert & oriented x 3 with fluent speech, no focal motor/sensory deficits  LABORATORY DATA:  I have reviewed the data as listed    Component Value Date/Time   NA 139 09/23/2021 0828   NA 141 11/06/2016 1102   K 3.5 09/23/2021 0828   K 3.9 11/06/2016 1102   CL 104 09/23/2021 0828   CO2 28 09/23/2021 0828   CO2 27 11/06/2016 1102   GLUCOSE 140 (H) 09/23/2021 0828   GLUCOSE 130 11/06/2016 1102   BUN 21 09/23/2021 0828   BUN 18.0 11/06/2016 1102   CREATININE 1.25 (H) 09/23/2021 0828   CREATININE 1.42 (H) 02/21/2021 1414   CREATININE 1.3 (H) 11/06/2016 1102   CALCIUM 9.5 09/23/2021  0828   CALCIUM 10.2 11/06/2016 1102   PROT 7.0 09/23/2021 0828   PROT 7.1 11/06/2016 1102   ALBUMIN 3.7 09/23/2021 0828   ALBUMIN 3.7 11/06/2016 1102   AST 33 09/23/2021 0828   AST 52 (H) 11/06/2016 1102   ALT 20 09/23/2021 0828   ALT 58 (H) 11/06/2016 1102   ALKPHOS 90 09/23/2021 0828   ALKPHOS 159 (H) 11/06/2016 1102   BILITOT 0.5 09/23/2021 0828   BILITOT 0.53 11/06/2016 1102   GFRNONAA 43 (L) 09/23/2021 0828   GFRNONAA 34 (L) 02/21/2021 1414   GFRAA 40 (L) 02/21/2021 1414    No results found for: SPEP, UPEP  Lab Results  Component Value Date   WBC 2.5 (L) 09/23/2021   NEUTROABS 1.3 (L) 09/23/2021   HGB 9.9 (L) 09/23/2021   HCT 29.2 (L) 09/23/2021   MCV 92.1 09/23/2021   PLT 92 (L) 09/23/2021      Chemistry      Component Value Date/Time   NA 139 09/23/2021 0828   NA 141 11/06/2016 1102   K 3.5 09/23/2021 0828   K 3.9 11/06/2016 1102   CL 104 09/23/2021 0828   CO2 28 09/23/2021 0828   CO2 27 11/06/2016 1102   BUN 21 09/23/2021 0828   BUN 18.0 11/06/2016 1102   CREATININE 1.25 (H) 09/23/2021 0828   CREATININE 1.42 (H) 02/21/2021 1414   CREATININE 1.3 (H) 11/06/2016 1102      Component Value Date/Time   CALCIUM 9.5 09/23/2021 0828   CALCIUM 10.2 11/06/2016 1102   ALKPHOS 90 09/23/2021 0828   ALKPHOS 159 (H) 11/06/2016 1102   AST 33 09/23/2021 0828   AST 52 (H) 11/06/2016 1102   ALT 20 09/23/2021 0828   ALT 58 (H) 11/06/2016 1102   BILITOT 0.5 09/23/2021 0828   BILITOT 0.53 11/06/2016 1102

## 2021-09-23 NOTE — Assessment & Plan Note (Signed)
Her pancytopenia has improved since she had extra week of break We will proceed with treatment today with minor dose reduction for the I recommend minimum 3 cycles of treatment before repeating imaging study In the future, her treatment cycle will be every other week

## 2021-09-23 NOTE — Progress Notes (Signed)
Chronic Care Management Pharmacy Note  09/26/2021 Name:  Kayla Baker MRN:  [id number] DOB:  12-30-1935  Summary: Pt is doing very well and in great spirits. Just completed chemo infusion yesterday and is feeling strong. She presents to office by herself. She is married to her husband who is a great support system to her. She also has 2 sisters who live in [place], one of them has lung cancer and is also going through chemotherapy. One of her daughters is a Marine scientist, niece is a PA. She speaks to them all  frequently and has made great comments about the book that she received from the office: "Anti-Cancer: A New Way of Life." BP in office today is 114/67, pulse 86. Patient weight today is 142.4 lbs.  Recommendations/Changes made from today's visit: Discussed Prilosec taper and completed a calender guide for discontinuation timeline. Pt is currently taking Prilosec every other day for 2 weeks, then every third day for 2 weeks then stop. Supplement with H2RAs if needed. Informed pt that she may need to reinitiate heartburn therapy during Thanksgiving time depending on what she eats.  Plan: -Will follow up in November for an osteoporosis assessment with emphasis on fall risk -Will follow-up with pt in November, GERD assessment to see if Prilosec taper was successful. -Pt declined BP and BG RPM.   Subjective: Kayla Baker is an 85 y.o. year old pleasant female who is a primary patient of [name], MD.  The CCM team was consulted for assistance with disease management and care coordination needs.    Engaged with patient face to face for initial visit in response to provider referral for pharmacy case management and/or care coordination services.   Consent to Services:  The patient was given the following information about Chronic Care Management services today, agreed to services, and gave verbal consent: 1. CCM service includes personalized support from designated clinical staff supervised by the  primary care provider, including individualized plan of care and coordination with other care providers 2. 24/7 contact phone numbers for assistance for urgent and routine care needs. 3. Service will only be billed when office clinical staff spend 20 minutes or more in a month to coordinate care. 4. Only one practitioner may furnish and bill the service in a calendar month. 5.The patient may stop CCM services at any time (effective at the end of the month) by phone call to the office staff. 6. The patient will be responsible for cost sharing (co-pay) of up to 20% of the service fee (after annual deductible is met). Patient agreed to services and consent obtained.  Patient Care Team: [name], MD as PCP - Kinnie Scales, [place] as Referring Physician (Optometry) [name], MD as Consulting Physician (Dermatology) Latanya Maudlin, MD as Consulting Physician (Orthopedic Surgery) Inda Castle, MD (Inactive) as Consulting Physician (Gastroenterology) Awanda Mink Craige Cotta, RN as Oncology Nurse Navigator (Oncology) Heath Lark, MD as Consulting Physician (Hematology and Oncology) Madelon Lips, MD as Consulting Physician (Nephrology) Rush Landmark, [facility] as Pharmacist (Pharmacist)  Recent office visits: 06/11/2021- Dr. Melford Aase- Patient was seen for a routine follow up. No medication changes.   Recent consult visits: 08/27/2021- Dr. Berline Lopes (Gynecologic Oncology)- Patient was seen for malignant neoplasm of ovary. No medication changes.    08/26/2021- Dr. [name] (hematology and oncology)- Patient was seen for oncology treatment. Stopped Ketoconazole 2% cream and Olaparib 179m.    06/20/2021- Dr. WJacqualyn Posey(podiatry)- Patient was seen for pain in toe of right foot. No medication changes.  Hospital visits: None in previous 6 months   Objective:  Lab Results  Component Value Date   CREATININE 1.25 (H) 09/23/2021   BUN 21 09/23/2021   GFRNONAA 43 (L) 09/23/2021   GFRAA 40 (L) 02/21/2021   NA  139 09/23/2021   K 3.5 09/23/2021   CALCIUM 9.5 09/23/2021   CO2 28 09/23/2021   GLUCOSE 140 (H) 09/23/2021    Lab Results  Component Value Date/Time   HGBA1C 6.6 (H) 09/19/2021 11:21 AM   HGBA1C 6.4 (H) 06/11/2021 10:07 AM   MICROALBUR 0.8 02/21/2021 02:14 PM   MICROALBUR <0.2 10/03/2020 11:53 AM    Last diabetic Eye exam:  Lab Results  Component Value Date/Time   HMDIABEYEEXA Retinopathy (A) 08/30/2020 12:00 AM    Last diabetic Foot exam: No results found for: HMDIABFOOTEX   Lab Results  Component Value Date   CHOL 133 09/19/2021   HDL 49 (L) 09/19/2021   LDLCALC 66 09/19/2021   TRIG 99 09/19/2021   CHOLHDL 2.7 09/19/2021    Hepatic Function Latest Ref Rng & Units 09/23/2021 09/12/2021 09/04/2021  Total Protein 6.5 - 8.1 g/dL 7.0 6.9 7.1  Albumin 3.5 - 5.0 g/dL 3.7 3.7 3.7  AST 15 - 41 U/L 33 52(H) 37  ALT 0 - 44 U/L 20 63(H) 18  Alk Phosphatase 38 - 126 U/L 90 105 109  Total Bilirubin 0.3 - 1.2 mg/dL 0.5 0.7 0.5  Bilirubin, Direct 0.0 - 0.2 mg/dL - - -    Lab Results  Component Value Date/Time   TSH 2.70 09/19/2021 11:21 AM   TSH 3.80 06/11/2021 10:07 AM    CBC Latest Ref Rng & Units 09/23/2021 09/12/2021 09/04/2021  WBC 4.0 - 10.5 K/uL 2.5(L) 2.7(L) 5.1  Hemoglobin 12.0 - 15.0 g/dL 9.9(L) 10.4(L) 10.9(L)  Hematocrit 36.0 - 46.0 % 29.2(L) 30.7(L) 32.5(L)  Platelets 150 - 400 K/uL 92(L) 51(L) 105(L)    Lab Results  Component Value Date/Time   VD25OH 66 06/11/2021 10:07 AM   VD25OH 50 02/21/2021 02:14 PM    Clinical ASCVD: No  The ASCVD Risk score (Arnett DK, et al., 2019) failed to calculate for the following reasons:   The 2019 ASCVD risk score is only valid for ages 69 to 46    Depression screen PHQ 2/9 09/19/2021 02/21/2021 02/02/2020  Decreased Interest 0 0 0  Down, Depressed, Hopeless 1 0 0  PHQ - 2 Score 1 0 0  Some recent data might be hidden     See below as applicable: Other: (CHADS2VASc if Afib, MMRC or CAT for COPD, ACT, DEXA)  Social  History   Tobacco Use  Smoking Status Never  Smokeless Tobacco Never   BP Readings from Last 3 Encounters:  09/23/21 133/65  09/19/21 102/60  09/12/21 (!) 123/59   Pulse Readings from Last 3 Encounters:  09/23/21 74  09/19/21 78  09/12/21 86   Wt Readings from Last 3 Encounters:  09/23/21 142 lb 3.2 oz (64.5 kg)  09/19/21 141 lb (64 kg)  09/12/21 140 lb 12.8 oz (63.9 kg)   BMI Readings from Last 3 Encounters:  09/23/21 24.41 kg/m  09/19/21 24.20 kg/m  09/12/21 24.17 kg/m    Assessment/Interventions: Review of patient past medical history, allergies, medications, health status, including review of consultants reports, laboratory and other test data, was performed as part of comprehensive evaluation and provision of chronic care management services.   SDOH:  (Social Determinants of Health) assessments and interventions performed: Yes SDOH Interventions  Flowsheet Row Most Recent Value  SDOH Interventions   Housing Interventions Intervention Not Indicated  Transportation Interventions Intervention Not Indicated      SDOH Screenings   Alcohol Screen: Not on file  Depression (PHQ2-9): Low Risk    PHQ-2 Score: 1  Financial Resource Strain: Not on file  Food Insecurity: Not on file  Housing: [place] Risk Score: 0  Physical Activity: Not on file  Social Connections: Not on file  Stress: Not on file  Tobacco Use: Low Risk    Smoking Tobacco Use: Never   Smokeless Tobacco Use: Never   Passive Exposure: Not on file  Transportation Needs: No Transportation Needs   Lack of Transportation (Medical): No   Lack of Transportation (Non-Medical): No    CCM Care Plan  Allergies  Allergen Reactions   Keflex [Cephalexin] Swelling    Tongue and throat swells   Meloxicam Swelling   Prednisone Swelling and Other (See Comments)    Can Not take High doses Cannot tolerate high doses Can Not take High doses   Premarin [Estrogens Conjugated] Hives    Trovan [Alatrofloxacin] Other (See Comments)    Unknown reaction    Medications Reviewed Today     Reviewed by Newton Pigg, [facility] (Pharmacist) on 09/26/21 at [place] List Status: <None>   Medication Order Taking? Sig Documenting Provider Last Dose Status Informant  acetaminophen (TYLENOL) 325 MG tablet [id number] Yes Take 325 mg by mouth every 6 (six) hours as needed for headache. [provider] Taking Active Self  allopurinol (ZYLOPRIM) 300 MG tablet [id number] Yes TAKE 1 TABLET BY MOUTH DAILY FOR GOUT PREVENTION  Patient taking differently: TAKE one-half TABLET BY MOUTH DAILY FOR GOUT PREVENTION,   Liane Comber, NP Taking Active   blood glucose meter kit and supplies KIT [id number] Yes Dispense based on patient and insurance preference. Use to check blood sugar once daily. Dx: E11.22, N18.30 Liane Comber, NP Taking Active Self  Cholecalciferol (VITAMIN D3) 5000 units CAPS [id number] Yes Take 5,000 Units by mouth daily. [provider] Taking Active Self  cyanocobalamin 1000 MCG tablet [id number] Yes Take 1,000 mcg by mouth daily. [provider] Taking Active Self  ezetimibe (ZETIA) 10 MG tablet [id number] Yes Patient knows to take by mouth  !   - Thanks [name], MD Taking Active   gabapentin (NEURONTIN) 300 MG capsule [id number] Yes Take 1 capsule in the morning and afternoon and 2 capsules at night Heath Lark, MD Taking Active   glimepiride (AMARYL) 1 MG tablet [id number] Yes Take 1 tab by mouth in the morning if fasting is running 150+. Please do not take this medication if you are ill or not eating as this can cause a low blood sugar. Liane Comber, NP Taking Active Self  glucose blood (FREESTYLE TEST STRIPS) test strip [id number] Yes USE TO CHECK BLOOD GLUCOSE ONCE DAILY Liane Comber, NP Taking Active   hydrochlorothiazide (HYDRODIURIL) 25 MG tablet [id number] Yes TAKE ONE TABLET BY MOUTH DAILY FOR FOR BLOOD PRESSURE AND FLUID RETENTION/ ANKLE  SWELLING Liane Comber, NP Taking Active   Lancets (FREESTYLE) lancets [id number] Yes CHECK BLOOD SUGAR DAILY AS DIRECTED Magda Bernheim, NP Taking Active   lidocaine-prilocaine (EMLA) cream [id number] Yes Apply to affected area once Heath Lark, MD Taking Active            Med Note Berline Lopes, MELISSA   Thu Feb 21, 2021  9:04 AM)    loratadine (CLARITIN) 10 MG tablet  [id number] Yes Take 10 mg by mouth daily as needed for allergies. [provider] Taking Active Self  Magnesium 250 MG TABS [id number] Yes Take 250 mg by mouth every other day. [provider] Taking Active Self  metoprolol succinate (TOPROL-XL) 25 MG 24 hr tablet [id number] Yes Take  1 tablet  Daily  for BP       /TAKE 1 TABLET BY MOUTH DAILY FOR BLOOD PRESSURE [name], MD Taking Active   omeprazole (PRILOSEC) 20 MG capsule [id number] Yes Take 1 capsule (20 mg total) by mouth daily. Heath Lark, MD Taking Active   ondansetron [facility]) 8 MG tablet [id number] Yes Take 1 tablet (8 mg total) by mouth 2 (two) times daily as needed for refractory nausea / vomiting. Start on day 3 after carboplatin chemo. Heath Lark, MD Taking Active Self           Med Note Berline Lopes, MELISSA   Thu Feb 21, 2021  9:05 AM)    PFIZER-BIONT COVID-19 VAC-TRIS SUSP injection [id number] Yes  [provider] Taking Active   prochlorperazine (COMPAZINE) 10 MG tablet [id number] Yes Take 1 tablet (10 mg total) by mouth every 6 (six) hours as needed for nausea or vomiting. Heath Lark, MD Taking Active   rosuvastatin (CRESTOR) 5 MG tablet [id number] Yes Take 1 tablet (5 mg total) by mouth 3 (three) times a week. In the evening for cholesterol. Liane Comber, NP Taking Active   trolamine salicylate (ASPERCREME) 10 % cream [id number] Yes Apply 1 application topically as needed for muscle pain (Use if you still feel tingles in feet in the morning after taking Gabapentin). [provider] Taking Active   vitamin C (ASCORBIC ACID) 500 MG tablet  [id number] Yes Take 500 mg by mouth every evening.  [provider] Taking Active Self  Med List Note Britt Boozer, [facility] 01/15/21 1008): Lynparza filled through [facility]            Patient Active Problem List   Diagnosis Date Noted   Peritoneal metastases ([place]) 09/18/2021   Poor dentition 07/26/2021   Nodule of lower lobe of right lung 06/27/2021   Aortic atherosclerosis ([place]) by Abd CT scan on 05/10/2021 06/10/2021   Elevated liver enzymes 03/12/2021   Mild nonproliferative diabetic retinopathy of both eyes without macular edema associated with type 2 diabetes mellitus ([place]) 02/20/2021   Osteopenia 02/20/2021   Deficiency anemia 11/02/2020   Genetic testing 09/14/2020   Pancytopenia, acquired ([place]) 05/15/2020   Cancer associated pain 05/15/2020   Anemia in neoplastic disease 05/03/2020   Goals of care, counseling/discussion 04/20/2020   Ovarian cancer ([place]) 04/19/2020   Edema of right lower leg 04/05/2020   CKD stage 3 due to type 2 diabetes mellitus ([place]) 07/05/2018   Port catheter in place 09/18/2016   Chemotherapy-induced peripheral neuropathy ([place]) 08/07/2016   Breast cancer of upper-inner quadrant of left female breast ([place]) 02/29/2016   Gout 12/08/2014   Diabetic neuropathy, painful ([place]) 08/28/2014   Medication management 08/27/2014   Essential hypertension 11/10/2013   Hyperlipidemia associated with type 2 diabetes mellitus ([place]) 11/10/2013   T2_NIDDM w/CKD3 ([place]) 11/10/2013   Vitamin D deficiency 11/10/2013    Immunization History  Administered Date(s) Administered   DTaP 07/01/2004   Influenza Whole 08/15/2013   Influenza, High Dose Seasonal PF 08/28/2014, 09/20/2015, 08/19/2017, 09/01/2018, 07/28/2019   Influenza-Unspecified 08/04/2020, 08/15/2021   PFIZER(Purple Top)SARS-COV-2 Vaccination 12/12/2019, 12/31/2019, 08/03/2020, 06/11/2021   Pneumococcal Conjugate-13 09/20/2015   Pneumococcal Polysaccharide-23 11/01/2009   Td 08/10/2009, 04/11/2020  Zoster, Live 08/02/2011    Conditions to be addressed/monitored:  Hypertension, Hyperlipidemia, Diabetes, GERD, Chronic Kidney Disease, Osteopenia, Gout, and Vitamin D deficiency , Ovarian Cancer  Care Plan : General Pharmacy (Adult)  Updates made by Newton Pigg, [facility] since 09/26/2021 12:00 AM     Problem: Hypertension, Hyperlipidemia, Diabetes, GERD, Chronic Kidney Disease, Osteopenia, Gout, and Vitamin D deficiency   Priority: High     Long-Range Goal: Patient-Specific Goal   Start Date: 09/24/2021  Expected End Date: 09/24/2022  Priority: High  Note:   Current Barriers:  Unable to independently monitor therapeutic efficacy of BP due to forgetting to check BP Desires to cut back on medication due to pill burden if possible  Pharmacist Clinical Goal(s):  Patient will achieve adherence to monitoring guidelines and medication adherence to achieve therapeutic efficacy by ensuring that patient checks BP at least 1-2 times per week Pt will adhere to prescribed medication regimen as evidenced by Continuing allopurinol therapy at 150 mg QD for duration of chemo treatment or until next UA lab value. Pt will continue PPI taper with goal of discontinuing by the end of November  Interventions: 1:1 collaboration with [name], MD regarding development and update of comprehensive plan of care as evidenced by provider attestation and co-signature Inter-disciplinary care team collaboration (see longitudinal plan of care) Comprehensive medication review performed; medication list updated in electronic medical record  Hypertension (BP goal <130/80) -Controlled -BP in office today is 114/67, pulse 86 -Current treatment: HCTZ 25 mgd QD (appropriate, effective, safe, accessible) Metoprolol succinate 446m QD (appropriate, effective, safe, accessible) -Medications previously tried: N/A  -Current home readings: Pt does not check often due to having a lot on her mind -Current dietary  habits: Patient cooks for herself. She limits salt intake She has been reading the book, "Anti-Cancer:A new way of life" and has been trying to incorporate healing spices such as ginger, curry, turmeric, and garlic. Denies alcohol consumption, smoking, and drinks a cup of coffee daily. -Current exercise habits: Patient tries to go for walks, patient does housework around the house as well -Denies hypotensive/hypertensive symptoms -Educated on BP goals and benefits of medications for prevention of heart attack, stroke and kidney damage; Daily salt intake goal < 2300 mg; Exercise goal of 150 minutes per week; Importance of home blood pressure monitoring; Symptoms of hypotension and importance of maintaining adequate hydration; -Counseled to monitor BP at home 1-2 times per week, especially if she feel symptoms of dizziness, document, and provide log at future appointments -Counseled on diet and exercise extensively Recommended to continue current medication  Hyperlipidemia: (LDL goal < 70) -Controlled -Lipid panel (09/19/2021): TC 133, HDL 49, TG 99, LDL 66 -Current treatment: Rosuvastatin 574m3 times per week (appropriate, effective, safe, accessible) -Medications previously tried: N/A  -Current dietary habits: Patient cooks for herself. She limits salt intake She has been reading the book, "Anti-Cancer:A new way of life" and has been trying to incorporate healing spices such as ginger, curry, turmeric, and garlic. Denies alcohol consumption, smoking, and drinks a cup of coffee daily. -Current exercise habits: Patient tries to go for walks, patient does housework around the house as well -Educated on Cholesterol goals;  Benefits of statin for ASCVD risk reduction; Exercise goal of 150 minutes per week; -Counseled on diet and exercise extensively Recommended to continue current medication  Diabetes (A1c goal <7%) -A1c (09/19/21) 6.6% -Controlled -Current medications: Glimepiride 46m46mAM  w/ food if BS > 150 -Medications previously tried: N/A  -Current home glucose  readings fasting glucose: low 100s, this morning was 104 -Denies hypoglycemic/hyperglycemic symptoms -Current dietary habits: Patient cooks for herself. She limits salt intake She has been reading the book, "Anti-Cancer:A new way of life" and has been trying to incorporate healing spices such as ginger, curry, turmeric, and garlic. Denies alcohol consumption, smoking, and drinks a cup of coffee daily. -Current exercise habits: Patient tries to go for walks, patient does housework around the house as well -Educated on A1c and blood sugar goals; Exercise goal of 150 minutes per week; Prevention and management of hypoglycemic episodes; Benefits of routine self-monitoring of blood sugar; Counseled to check feet daily and get yearly eye exams -Counseled to check feet daily and get yearly eye exams -Counseled on diet and exercise extensively Recommended to continue current medication  Ovarian cancer Stage IIIb/Cancer Pain and Chemo-induced neuropathy/Edema/ Anemia with neoplastic disease -Uncontrolled -s/p ovarian resection 11/27 -Anemia, pancytopenia, neuropathy followed by oncologist -Current treatment  Gabapentin 311m QD (Current Treatment for cancer pain induced neuropathy) (appropriate, effective, safe, accessible) Medications previously tried: oral chemo -Recommended to continue current medication and follow-up with oncologist for management of chemo related side effects  Mental Health -Controlled -Assessed how patient is holding up with cancer diagnosis and chemo. Patient is holding up strong and sometimes is down when she thinks about the diagnosis. She endorses a great support system and she declines counseling referral at this time. -Current treatment: None at this time -PHQ2: 1 (09/19/21) -Educated on Benefits of cognitive-behavioral therapy with or without medication -Recommended reaching out if she  desires to begin counseling  Osteopenia (Goal prevent falls and restore bone strength) -Uncontrolled  DualFemur Neck Right 05/12/2019   -1.6  DualFemur Neck Right 01/26/2017   -1.0 AP Spine  L1-L4         05/12/2019    -0.6   AP Spine  L1-L4           05/22/2014    -0.2     DualFemur Total Mean 05/12/2019 -0.5  DualFemur Total Mean 01/26/2017    0.2    -Current treatment  Vitamin D3 5000 units QD (appropriate, effective, safe, accessible) High Calcium diet -Recommend [phone] units of vitamin D daily. Recommend 1200 mg of calcium daily from dietary and supplemental sources. Recommend weight-bearing and muscle strengthening exercises for building and maintaining bone density. -Counseled on diet and exercise extensively Recommended to continue current medication Provided patient with informational sheet on high calcium foods  Chronic Kidney Disease Stage 3b  -All medications assessed for renal dosing and appropriateness in chronic kidney disease. -Counseled on BP and BG control, and avoiding NSAIDs  Gout (Goal: Uric Acid <6) -Controlled -Current treatment  Allopurinol 3067mtablet (pt taking 1/2 tablet 15072maily) (appropriate, effective, safe, accessible, query need) -Last goat attack: Pt does not remember but it has been a few years -Uric Acid level: (02/21/21) 5.0 mg/dL -Pt interested in tapering off of this medication completely -Counseled on diet and exercise extensively Recommended to continue current medication Per provider consult: Dr. McKMelford Aasephasized that chemo may increase uric acid levels, AshCaryl Pinauld like to cut back on allopurinol if uric acid levels are consistently with chemo. -Counseled patient on avoidance of food that can increase uric acid levels such as alcohol, shellfish, and organ meats   GERD -Controlled -Current treatment  Prilosec 75m9mD (appropriate, effective, safe, accessible, query need -Medications previously tried: N/A -Pt denies having  issues with heart burn for a while. Pt has already began a slow taper. We  discussed game plan to continue taking Prilosec QOD for 2 weeks then Prilosec every third day for 2 weeks then stop. Pt may supplement with an H2RA if needed and also informed pt she may also need to reinitiate during holidays where she will eat more  -Counseled patient on non-pharmacological interventions such as wearing loose fitting clothes, eating smaller, more frequent meals, trying not to eat during bedtime.  Health Maintenance -Vaccine gaps: Covid vaccine, patient received -Current therapy:  Vit B12 1014mg QD (appropriate, effective, safe, accessible) Magnesium 2535mQOD (appropriate, effective, safe, accessible) Vitamin C 50039mPM (appropriate, effective, safe, accessible) Vitamin D3 5000 IU QD (appropriate, effective, safe, accessible) -Educated on Cost vs benefit of each product must be carefully weighed by individual consumer -Patient is satisfied with current therapy and denies issues -Recommended to continue current medication  Patient Goals/Self-Care Activities Patient will:  - take medications as prescribed check glucose daily, document, and provide at future appointments check blood pressure 1-2 times per week, document, and provide at future appointments target a minimum of 150 minutes of moderate intensity exercise weekly engage in dietary modifications by increasing Calcium intake, with low-fat dairy broccoli, kale  Follow Up Plan: Face to Face appointment with care management team member scheduled for: 03/25/2022 The patient has been provided with contact information for the care management team and has been advised to call with any health related questions or concerns.  Next PCP appointment scheduled for: 02/21/2022 Next AWV (Annual Wellness Visit) scheduled for: 09/12/2022       Medication Assistance: None required.  Patient affirms current coverage meets  needs.  Compliance/Adherence/Medication fill history: Care Gaps: Covid -19 vaccine  [facility]: Rosuvastatin 5mg73matient's preferred pharmacy is:  [facility] [address]: [phone]: [phone] [address] [facility] [place]ne: [phone]: 336-[place] [place] -[place] [place]: [phone]: [phone]: [phone]: 336-[place] [address]ne: [phone]: [phone] pill box? Yes Pt endorses 100% compliance  We discussed: Benefits of medication synchronization, packaging and delivery as well as enhanced pharmacist oversight with [employer]. Patient decided to: Continue current medication management strategy  Care Plan and Follow Up Patient Decision:  Patient agrees to Care Plan and Follow-up.  Plan: Face to Face appointment with care management team member scheduled for: 03/25/2022 The patient has been provided with contact information for the care management team and has been advised to call with any health related questions or concerns.  Next PCP appointment scheduled for: 02/21/2022 Next AWV (Annual Wellness Visit) scheduled for: 09/12/2022

## 2021-09-23 NOTE — Patient Instructions (Signed)
[facility]  Discharge Instructions: Thank you for choosing [facility] to provide your oncology and hematology care.   If you have a lab appointment with the [facility], please go directly to the [facility] and check in at the registration area.   Wear comfortable clothing and clothing appropriate for easy access to any Portacath or PICC line.   We strive to give you quality time with your provider. You may need to reschedule your appointment if you arrive late (15 or more minutes).  Arriving late affects you and other patients whose appointments are after yours.  Also, if you miss three or more appointments without notifying the office, you may be dismissed from the clinic at the provider's discretion.      For prescription refill requests, have your pharmacy contact our office and allow 72 hours for refills to be completed.    Today you received the following chemotherapy and/or immunotherapy agents Gemzar      To help prevent nausea and vomiting after your treatment, we encourage you to take your nausea medication as directed.  BELOW ARE SYMPTOMS THAT SHOULD BE REPORTED IMMEDIATELY: *FEVER GREATER THAN 100.4 F (38 C) OR HIGHER *CHILLS OR SWEATING *NAUSEA AND VOMITING THAT IS NOT CONTROLLED WITH YOUR NAUSEA MEDICATION *UNUSUAL SHORTNESS OF BREATH *UNUSUAL BRUISING OR BLEEDING *URINARY PROBLEMS (pain or burning when urinating, or frequent urination) *BOWEL PROBLEMS (unusual diarrhea, constipation, pain near the anus) TENDERNESS IN MOUTH AND THROAT WITH OR WITHOUT PRESENCE OF ULCERS (sore throat, sores in mouth, or a toothache) UNUSUAL RASH, SWELLING OR PAIN  UNUSUAL VAGINAL DISCHARGE OR ITCHING   Items with * indicate a potential emergency and should be followed up as soon as possible or go to the Emergency Department if any problems should occur.  Please show the CHEMOTHERAPY ALERT CARD or IMMUNOTHERAPY ALERT CARD at check-in to the  Emergency Department and triage nurse.  Should you have questions after your visit or need to cancel or reschedule your appointment, please contact [facility]  Dept: [phone]  and follow the prompts.  Office hours are 8:00 a.m. to 4:30 p.m. Monday - Friday. Please note that voicemails left after 4:00 p.m. may not be returned until the following business day.  We are closed weekends and major holidays. You have access to a nurse at all times for urgent questions. Please call the main number to the clinic Dept: [phone] and follow the prompts.   For any non-urgent questions, you may also contact your provider using MyChart. We now offer e-Visits for anyone 61 and older to request care online for non-urgent symptoms. For details visit [email or web address].   Also download the MyChart app! Go to the app store, search "MyChart", open the app, select [facility], and log in with your MyChart username and password.  Due to Covid, a mask is required upon entering the hospital/clinic. If you do not have a mask, one will be given to you upon arrival. For doctor visits, patients may have 1 support person aged 9 or older with them. For treatment visits, patients cannot have anyone with them due to current Covid guidelines and our immunocompromised population.

## 2021-09-24 ENCOUNTER — Ambulatory Visit (INDEPENDENT_AMBULATORY_CARE_PROVIDER_SITE_OTHER): Payer: PPO | Admitting: Pharmacist

## 2021-09-24 DIAGNOSIS — E1140 Type 2 diabetes mellitus with diabetic neuropathy, unspecified: Secondary | ICD-10-CM

## 2021-09-24 DIAGNOSIS — I10 Essential (primary) hypertension: Secondary | ICD-10-CM | POA: Diagnosis not present

## 2021-09-24 DIAGNOSIS — D630 Anemia in neoplastic disease: Secondary | ICD-10-CM | POA: Diagnosis not present

## 2021-09-24 DIAGNOSIS — M8580 Other specified disorders of bone density and structure, unspecified site: Secondary | ICD-10-CM

## 2021-09-24 DIAGNOSIS — G893 Neoplasm related pain (acute) (chronic): Secondary | ICD-10-CM

## 2021-09-24 DIAGNOSIS — E1122 Type 2 diabetes mellitus with diabetic chronic kidney disease: Secondary | ICD-10-CM

## 2021-09-24 DIAGNOSIS — E559 Vitamin D deficiency, unspecified: Secondary | ICD-10-CM

## 2021-09-24 DIAGNOSIS — D539 Nutritional anemia, unspecified: Secondary | ICD-10-CM

## 2021-09-24 DIAGNOSIS — I700 Atherosclerosis of aorta: Secondary | ICD-10-CM

## 2021-09-24 DIAGNOSIS — G620 Drug-induced polyneuropathy: Secondary | ICD-10-CM

## 2021-09-24 DIAGNOSIS — E785 Hyperlipidemia, unspecified: Secondary | ICD-10-CM | POA: Diagnosis not present

## 2021-09-24 DIAGNOSIS — D61818 Other pancytopenia: Secondary | ICD-10-CM | POA: Diagnosis not present

## 2021-09-24 DIAGNOSIS — N183 Chronic kidney disease, stage 3 unspecified: Secondary | ICD-10-CM | POA: Diagnosis not present

## 2021-09-24 DIAGNOSIS — E1169 Type 2 diabetes mellitus with other specified complication: Secondary | ICD-10-CM

## 2021-09-24 DIAGNOSIS — C569 Malignant neoplasm of unspecified ovary: Secondary | ICD-10-CM

## 2021-09-24 DIAGNOSIS — Z79899 Other long term (current) drug therapy: Secondary | ICD-10-CM

## 2021-09-24 DIAGNOSIS — T451X5A Adverse effect of antineoplastic and immunosuppressive drugs, initial encounter: Secondary | ICD-10-CM

## 2021-09-24 DIAGNOSIS — R600 Localized edema: Secondary | ICD-10-CM

## 2021-09-26 NOTE — Patient Instructions (Signed)
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   A rash or skin blisters are common when you have GVHD.   Taking really good care of your skin will help to keep your skin unbroken.    Notes:      Perform Foot Care-Diabetes Type 2       Timeframe:  Long-Range Goal Priority:  High Start Date:                             Expected End Date:                       Follow Up Date 03/25/2022    - check feet daily for cuts, sores or redness - keep feet up while sitting - trim toenails straight across - wash and dry feet carefully every day - wear comfortable, cotton socks - wear comfortable, well-fitting shoes    Why is this important?   Good foot care is very important when you have diabetes.  There are many things you can do to keep your feet healthy and catch a problem early.    Notes:      Track and Manage My Blood Pressure-Hypertension       Timeframe:  Long-Range Goal Priority:  High Start Date:                             Expected End Date:                       Follow Up Date 03/25/2022    - check blood pressure weekly - write blood pressure results in a log or diary    Why is this important?   You won't feel high blood pressure, but it can still hurt your blood vessels.  High blood pressure can cause heart or kidney problems. It can also cause a stroke.  Making lifestyle changes like losing a little weight or eating less salt will help.  Checking your  blood pressure at home and at different times of the day can help to control blood pressure.  If the doctor prescribes medicine remember to take it the way the doctor ordered.  Call the office if you cannot afford the medicine or if there are questions about it.     Notes:         Consent to CCM Services: Kayla Baker was given information about Chronic Care Management services including:  CCM service includes  personalized support from designated clinical staff supervised by her physician, including individualized plan of care and coordination with other care providers 24/7 contact phone numbers for assistance for urgent and routine care needs. Service will only be billed when office clinical staff spend 20 minutes or more in a month to coordinate care. Only one practitioner may furnish and bill the service in a calendar month. The patient may stop CCM services at any time (effective at the end of the month) by phone call to the office staff. The patient will be responsible for cost sharing (co-pay) of up to 20% of the service fee (after annual deductible is met).  Patient agreed to services and verbal consent obtained.   The patient verbalized understanding of instructions, educational materials, and care plan provided today and agreed to receive a mailed copy of patient instructions, educational materials, and care plan.   Face to Face appointment with care management team member scheduled for: 03/25/2022 The patient has been provided with contact information for the care management team and has been advised to call with any health related questions or concerns.  Next PCP appointment scheduled for: 02/21/2022 Next AWV (Annual Wellness Visit) scheduled for: 09/12/2022  Signature: Rachelle Hora Jeannett Senior, PharmD   Clinical Pharmacist   [email or web address]   [phone]    CLINICAL CARE PLAN: Patient Care Plan: General Pharmacy (Adult)     Problem Identified: Hypertension,  Hyperlipidemia, Diabetes, GERD, Chronic Kidney Disease, Osteopenia, Gout, and Vitamin D deficiency   Priority: High     Long-Range Goal: Patient-Specific Goal   Start Date: 09/24/2021  Expected End Date: 09/24/2022  Priority: High  Note:   Current Barriers:  Unable to independently monitor therapeutic efficacy of BP due to forgetting to check BP Desires to cut back on medication due to pill burden if possible  Pharmacist Clinical Goal(s):  Patient will achieve adherence to monitoring guidelines and medication adherence to achieve therapeutic efficacy by ensuring that patient checks BP at least 1-2 times per week Pt will adhere to prescribed medication regimen as evidenced by Continuing allopurinol therapy at 150 mg QD for duration of chemo treatment or until next UA lab value. Pt will continue PPI taper with goal of discontinuing by the end of November  Interventions: 1:1 collaboration with [name], MD regarding development and update of comprehensive plan of care as evidenced by provider attestation and co-signature Inter-disciplinary care team collaboration (see longitudinal plan of care) Comprehensive medication review performed; medication list updated in electronic medical record  Hypertension (BP goal <130/80) -Controlled -BP in office today is 114/67, pulse 86 -Current treatment: HCTZ 25 mgd QD (appropriate, effective, safe, accessible) Metoprolol succinate 82m QD (appropriate, effective, safe, accessible) -Medications previously tried: N/A  -Current home readings: Pt does not check often due to having a lot on her mind -Current dietary habits: Patient cooks for herself. She limits salt intake She has been reading the book, "Anti-Cancer:A new way of life" and has been trying to incorporate healing spices such as ginger, curry, turmeric, and garlic. Denies alcohol consumption, smoking, and drinks a cup of coffee daily. -Current exercise habits: Patient tries to go for  walks, patient does housework around the house as well -Denies hypotensive/hypertensive symptoms -Educated on BP goals and benefits of medications for prevention of heart attack, stroke and kidney damage; Daily salt intake goal < 2300 mg; Exercise goal of 150 minutes per week; Importance of home blood pressure monitoring; Symptoms of hypotension and importance of maintaining adequate hydration; -Counseled to monitor  BP at home 1-2 times per week, especially if she feel symptoms of dizziness, document, and provide log at future appointments -Counseled on diet and exercise extensively Recommended to continue current medication  Hyperlipidemia: (LDL goal < 70) -Controlled -Lipid panel (09/19/2021): TC 133, HDL 49, TG 99, LDL 66 -Current treatment: Rosuvastatin 12m 3 times per week (appropriate, effective, safe, accessible) -Medications previously tried: N/A  -Current dietary habits: Patient cooks for herself. She limits salt intake She has been reading the book, "Anti-Cancer:A new way of life" and has been trying to incorporate healing spices such as ginger, curry, turmeric, and garlic. Denies alcohol consumption, smoking, and drinks a cup of coffee daily. -Current exercise habits: Patient tries to go for walks, patient does housework around the house as well -Educated on Cholesterol goals;  Benefits of statin for ASCVD risk reduction; Exercise goal of 150 minutes per week; -Counseled on diet and exercise extensively Recommended to continue current medication  Diabetes (A1c goal <7%) -A1c (09/19/21) 6.6% -Controlled -Current medications: Glimepiride 142mQAM w/ food if BS > 150 -Medications previously tried: N/A  -Current home glucose readings fasting glucose: low 100s, this morning was 104 -Denies hypoglycemic/hyperglycemic symptoms -Current dietary habits: Patient cooks for herself. She limits salt intake She has been reading the book, "Anti-Cancer:A new way of life" and has been  trying to incorporate healing spices such as ginger, curry, turmeric, and garlic. Denies alcohol consumption, smoking, and drinks a cup of coffee daily. -Current exercise habits: Patient tries to go for walks, patient does housework around the house as well -Educated on A1c and blood sugar goals; Exercise goal of 150 minutes per week; Prevention and management of hypoglycemic episodes; Benefits of routine self-monitoring of blood sugar; Counseled to check feet daily and get yearly eye exams -Counseled to check feet daily and get yearly eye exams -Counseled on diet and exercise extensively Recommended to continue current medication  Ovarian cancer Stage IIIb/Cancer Pain and Chemo-induced neuropathy/Edema/ Anemia with neoplastic disease -Uncontrolled -s/p ovarian resection 11/27 -Anemia, pancytopenia, neuropathy followed by oncologist -Current treatment  Gabapentin 30076mD (Current Treatment for cancer pain induced neuropathy) (appropriate, effective, safe, accessible) Medications previously tried: oral chemo -Recommended to continue current medication and follow-up with oncologist for management of chemo related side effects  Mental Health -Controlled -Assessed how patient is holding up with cancer diagnosis and chemo. Patient is holding up strong and sometimes is down when she thinks about the diagnosis. She endorses a great support system and she declines counseling referral at this time. -Current treatment: None at this time -PHQ2: 1 (09/19/21) -Educated on Benefits of cognitive-behavioral therapy with or without medication -Recommended reaching out if she desires to begin counseling  Osteopenia (Goal prevent falls and restore bone strength) -Uncontrolled  DualFemur Neck Right 05/12/2019   -1.6  DualFemur Neck Right 01/26/2017   -1.0 AP Spine  L1-L4         05/12/2019    -0.6   AP Spine  L1-L4           05/22/2014    -0.2     DualFemur Total Mean 05/12/2019 -0.5  DualFemur  Total Mean 01/26/2017    0.2    -Current treatment  Vitamin D3 5000 units QD (appropriate, effective, safe, accessible) High Calcium diet -Recommend [phone] units of vitamin D daily. Recommend 1200 mg of calcium daily from dietary and supplemental sources. Recommend weight-bearing and muscle strengthening exercises for building and maintaining bone density. -Counseled on diet and exercise extensively Recommended to continue  current medication Provided patient with informational sheet on high calcium foods  Chronic Kidney Disease Stage 3b  -All medications assessed for renal dosing and appropriateness in chronic kidney disease. -Counseled on BP and BG control, and avoiding NSAIDs  Gout (Goal: Uric Acid <6) -Controlled -Current treatment  Allopurinol 345m tablet (pt taking 1/2 tablet 1567mdaily) (appropriate, effective, safe, accessible, query need) -Last goat attack: Pt does not remember but it has been a few years -Uric Acid level: (02/21/21) 5.0 mg/dL -Pt interested in tapering off of this medication completely -Counseled on diet and exercise extensively Recommended to continue current medication Per provider consult: Dr. McMelford Aasemphasized that chemo may increase uric acid levels, AsCaryl Pinaould like to cut back on allopurinol if uric acid levels are consistently with chemo. -Counseled patient on avoidance of food that can increase uric acid levels such as alcohol, shellfish, and organ meats   GERD -Controlled -Current treatment  Prilosec 2031mOD (appropriate, effective, safe, accessible, query need -Medications previously tried: N/A -Pt denies having issues with heart burn for a while. Pt has already began a slow taper. We discussed game plan to continue taking Prilosec QOD for 2 weeks then Prilosec every third day for 2 weeks then stop. Pt may supplement with an H2RA if needed and also informed pt she may also need to reinitiate during holidays where she will eat more  -Counseled  patient on non-pharmacological interventions such as wearing loose fitting clothes, eating smaller, more frequent meals, trying not to eat during bedtime.  Health Maintenance -Vaccine gaps: Covid vaccine, patient received -Current therapy:  Vit B12 1000m69mD (appropriate, effective, safe, accessible) Magnesium 250mg40m (appropriate, effective, safe, accessible) Vitamin C 500mg 53m(appropriate, effective, safe, accessible) Vitamin D3 5000 IU QD (appropriate, effective, safe, accessible) -Educated on Cost vs benefit of each product must be carefully weighed by individual consumer -Patient is satisfied with current therapy and denies issues -Recommended to continue current medication  Patient Goals/Self-Care Activities Patient will:  - take medications as prescribed check glucose daily, document, and provide at future appointments check blood pressure 1-2 times per week, document, and provide at future appointments target a minimum of 150 minutes of moderate intensity exercise weekly engage in dietary modifications by increasing Calcium intake, with low-fat dairy broccoli, kale  Follow Up Plan: Face to Face appointment with care management team member scheduled for: 03/25/2022 The patient has been provided with contact information for the care management team and has been advised to call with any health related questions or concerns.  Next PCP appointment scheduled for: 02/21/2022 Next AWV (Annual Wellness Visit) scheduled for: 09/12/2022

## 2021-09-30 DIAGNOSIS — E1169 Type 2 diabetes mellitus with other specified complication: Secondary | ICD-10-CM | POA: Diagnosis not present

## 2021-09-30 DIAGNOSIS — K219 Gastro-esophageal reflux disease without esophagitis: Secondary | ICD-10-CM | POA: Diagnosis not present

## 2021-09-30 DIAGNOSIS — I10 Essential (primary) hypertension: Secondary | ICD-10-CM | POA: Diagnosis not present

## 2021-10-04 MED FILL — Dexamethasone Sodium Phosphate Inj 100 MG/10ML: INTRAMUSCULAR | Qty: 1 | Status: AC

## 2021-10-04 MED FILL — Fosaprepitant Dimeglumine For IV Infusion 150 MG (Base Eq): INTRAVENOUS | Qty: 5 | Status: AC

## 2021-10-07 ENCOUNTER — Inpatient Hospital Stay: Payer: PPO | Attending: Gynecologic Oncology

## 2021-10-07 ENCOUNTER — Encounter: Payer: Self-pay | Admitting: Hematology and Oncology

## 2021-10-07 ENCOUNTER — Inpatient Hospital Stay: Payer: PPO | Admitting: Hematology and Oncology

## 2021-10-07 ENCOUNTER — Other Ambulatory Visit: Payer: Self-pay

## 2021-10-07 ENCOUNTER — Inpatient Hospital Stay: Payer: PPO

## 2021-10-07 DIAGNOSIS — C569 Malignant neoplasm of unspecified ovary: Secondary | ICD-10-CM

## 2021-10-07 DIAGNOSIS — Z95828 Presence of other vascular implants and grafts: Secondary | ICD-10-CM

## 2021-10-07 DIAGNOSIS — N183 Chronic kidney disease, stage 3 unspecified: Secondary | ICD-10-CM | POA: Diagnosis not present

## 2021-10-07 DIAGNOSIS — C50212 Malignant neoplasm of upper-inner quadrant of left female breast: Secondary | ICD-10-CM

## 2021-10-07 DIAGNOSIS — C561 Malignant neoplasm of right ovary: Secondary | ICD-10-CM | POA: Insufficient documentation

## 2021-10-07 DIAGNOSIS — E1122 Type 2 diabetes mellitus with diabetic chronic kidney disease: Secondary | ICD-10-CM | POA: Insufficient documentation

## 2021-10-07 DIAGNOSIS — Z79899 Other long term (current) drug therapy: Secondary | ICD-10-CM | POA: Insufficient documentation

## 2021-10-07 DIAGNOSIS — Z170 Estrogen receptor positive status [ER+]: Secondary | ICD-10-CM

## 2021-10-07 DIAGNOSIS — Z5111 Encounter for antineoplastic chemotherapy: Secondary | ICD-10-CM | POA: Diagnosis not present

## 2021-10-07 DIAGNOSIS — N1831 Chronic kidney disease, stage 3a: Secondary | ICD-10-CM | POA: Diagnosis not present

## 2021-10-07 DIAGNOSIS — D61818 Other pancytopenia: Secondary | ICD-10-CM | POA: Diagnosis not present

## 2021-10-07 LAB — CBC WITH DIFFERENTIAL/PLATELET
Abs Immature Granulocytes: 0.03 10*3/uL (ref 0.00–0.07)
Basophils Absolute: 0 10*3/uL (ref 0.0–0.1)
Basophils Relative: 1 %
Eosinophils Absolute: 0 10*3/uL (ref 0.0–0.5)
Eosinophils Relative: 1 %
HCT: 28.2 % — ABNORMAL LOW (ref 36.0–46.0)
Hemoglobin: 9.5 g/dL — ABNORMAL LOW (ref 12.0–15.0)
Immature Granulocytes: 1 %
Lymphocytes Relative: 25 %
Lymphs Abs: 1.1 10*3/uL (ref 0.7–4.0)
MCH: 32 pg (ref 26.0–34.0)
MCHC: 33.7 g/dL (ref 30.0–36.0)
MCV: 94.9 fL (ref 80.0–100.0)
Monocytes Absolute: 0.6 10*3/uL (ref 0.1–1.0)
Monocytes Relative: 14 %
Neutro Abs: 2.6 10*3/uL (ref 1.7–7.7)
Neutrophils Relative %: 58 %
Platelets: 107 10*3/uL — ABNORMAL LOW (ref 150–400)
RBC: 2.97 MIL/uL — ABNORMAL LOW (ref 3.87–5.11)
RDW: 17.3 % — ABNORMAL HIGH (ref 11.5–15.5)
WBC: 4.3 10*3/uL (ref 4.0–10.5)
nRBC: 0 % (ref 0.0–0.2)

## 2021-10-07 LAB — COMPREHENSIVE METABOLIC PANEL
ALT: 22 U/L (ref 0–44)
AST: 37 U/L (ref 15–41)
Albumin: 3.4 g/dL — ABNORMAL LOW (ref 3.5–5.0)
Alkaline Phosphatase: 111 U/L (ref 38–126)
Anion gap: 7 (ref 5–15)
BUN: 16 mg/dL (ref 8–23)
CO2: 28 mmol/L (ref 22–32)
Calcium: 9.4 mg/dL (ref 8.9–10.3)
Chloride: 107 mmol/L (ref 98–111)
Creatinine, Ser: 1.16 mg/dL — ABNORMAL HIGH (ref 0.44–1.00)
GFR, Estimated: 47 mL/min — ABNORMAL LOW (ref >60)
Glucose, Bld: 116 mg/dL — ABNORMAL HIGH (ref 70–99)
Potassium: 3.8 mmol/L (ref 3.5–5.1)
Sodium: 142 mmol/L (ref 135–145)
Total Bilirubin: 0.5 mg/dL (ref 0.3–1.2)
Total Protein: 6.7 g/dL (ref 6.5–8.1)

## 2021-10-07 MED ORDER — SODIUM CHLORIDE 0.9 % IV SOLN: Freq: Once | INTRAVENOUS | Status: AC

## 2021-10-07 MED ORDER — SODIUM CHLORIDE 0.9 % IV SOLN
500.0000 mg/m2 | Freq: Once | INTRAVENOUS | Status: AC
  Administered 2021-10-07: 836 mg via INTRAVENOUS
  Filled 2021-10-07: qty 21.99

## 2021-10-07 MED ORDER — DIPHENHYDRAMINE HCL 50 MG/ML IJ SOLN
25.0000 mg | Freq: Once | INTRAMUSCULAR | Status: AC
  Administered 2021-10-07: 25 mg via INTRAVENOUS
  Filled 2021-10-07: qty 1

## 2021-10-07 MED ORDER — SODIUM CHLORIDE 0.9% FLUSH
10.0000 mL | INTRAVENOUS | Status: DC | PRN
  Administered 2021-10-07: 10 mL

## 2021-10-07 MED ORDER — SODIUM CHLORIDE 0.9 % IV SOLN
10.0000 mg | Freq: Once | INTRAVENOUS | Status: AC
  Administered 2021-10-07: 10 mg via INTRAVENOUS
  Filled 2021-10-07: qty 10

## 2021-10-07 MED ORDER — HEPARIN SOD (PORK) LOCK FLUSH 100 UNIT/ML IV SOLN
500.0000 [IU] | Freq: Once | INTRAVENOUS | Status: AC | PRN
  Administered 2021-10-07: 500 [IU]

## 2021-10-07 MED ORDER — SODIUM CHLORIDE 0.9% FLUSH
10.0000 mL | Freq: Once | INTRAVENOUS | Status: AC
  Administered 2021-10-07: 10 mL

## 2021-10-07 MED ORDER — SODIUM CHLORIDE 0.9 % IV SOLN
150.0000 mg | Freq: Once | INTRAVENOUS | Status: AC
  Administered 2021-10-07: 150 mg via INTRAVENOUS
  Filled 2021-10-07: qty 150

## 2021-10-07 MED ORDER — FAMOTIDINE 20 MG IN NS 100 ML IVPB
20.0000 mg | Freq: Once | INTRAVENOUS | Status: AC
  Administered 2021-10-07: 20 mg via INTRAVENOUS
  Filled 2021-10-07: qty 100

## 2021-10-07 MED ORDER — PALONOSETRON HCL INJECTION 0.25 MG/5ML
0.2500 mg | Freq: Once | INTRAVENOUS | Status: AC
  Administered 2021-10-07: 0.25 mg via INTRAVENOUS
  Filled 2021-10-07: qty 5

## 2021-10-07 MED ORDER — SODIUM CHLORIDE 0.9 % IV SOLN
248.4000 mg | Freq: Once | INTRAVENOUS | Status: AC
  Administered 2021-10-07: 250 mg via INTRAVENOUS
  Filled 2021-10-07: qty 25

## 2021-10-07 NOTE — Progress Notes (Signed)
[place] OFFICE PROGRESS NOTE  Patient Care Team: [name], MD as PCP - Kinnie Scales, OD as Referring Physician (Optometry) [name], MD as Consulting Physician (Dermatology) Latanya Maudlin, MD as Consulting Physician (Orthopedic Surgery) Inda Castle, MD (Inactive) as Consulting Physician (Gastroenterology) Awanda Mink Craige Cotta, RN as Oncology Nurse Navigator (Oncology) Heath Lark, MD as Consulting Physician (Hematology and Oncology) Madelon Lips, MD as Consulting Physician (Nephrology) Rush Landmark, [facility] as Pharmacist (Pharmacist)  ASSESSMENT & PLAN:  Ovarian cancer [facility]) Her pancytopenia has improved since she had extra week of break We will proceed with treatment today  I recommend minimum 3 cycles of treatment before repeating imaging study In the future, her treatment cycle will be every other week  Pancytopenia, acquired [facility]) This is due to side effects of chemotherapy She does not need transfusion support We will space out her appointment as above  CKD stage 3 due to type 2 diabetes mellitus ([place]) She has stable chronic kidney disease stage III Observe closely  No orders of the defined types were placed in this encounter.   All questions were answered. The patient knows to call the clinic with any problems, questions or concerns. The total time spent in the appointment was 20 minutes encounter with patients including review of chart and various tests results, discussions about plan of care and coordination of care plan   Heath Lark, MD 10/07/2021 2:23 PM  INTERVAL HISTORY: Please see below for problem oriented charting. she returns for treatment follow-up with her husband She is receiving chemotherapy with combination chemotherapy of carboplatin with gemcitabine Since last time I saw her, she feels well No perceived side effects from treatment  REVIEW OF SYSTEMS:   Constitutional: Denies fevers, chills or abnormal weight  loss Eyes: Denies blurriness of vision Ears, nose, mouth, throat, and face: Denies mucositis or sore throat Respiratory: Denies cough, dyspnea or wheezes Cardiovascular: Denies palpitation, chest discomfort or lower extremity swelling Gastrointestinal:  Denies nausea, heartburn or change in bowel habits Skin: Denies abnormal skin rashes Lymphatics: Denies new lymphadenopathy or easy bruising Neurological:Denies numbness, tingling or new weaknesses Behavioral/Psych: Mood is stable, no new changes  All other systems were reviewed with the patient and are negative.  I have reviewed the past medical history, past surgical history, social history and family history with the patient and they are unchanged from previous note.  ALLERGIES:  is allergic to keflex [cephalexin], meloxicam, prednisone, premarin [estrogens conjugated], and trovan [alatrofloxacin].  MEDICATIONS:  Current Outpatient Medications  Medication Sig Dispense Refill   acetaminophen (TYLENOL) 325 MG tablet Take 325 mg by mouth every 6 (six) hours as needed for headache.     allopurinol (ZYLOPRIM) 300 MG tablet TAKE 1 TABLET BY MOUTH DAILY FOR GOUT PREVENTION (Patient taking differently: TAKE one-half TABLET BY MOUTH DAILY FOR GOUT PREVENTION,) 90 tablet 1   blood glucose meter kit and supplies KIT Dispense based on patient and insurance preference. Use to check blood sugar once daily. Dx: E11.22, N18.30 1 each 12   Cholecalciferol (VITAMIN D3) 5000 units CAPS Take 5,000 Units by mouth daily.     cyanocobalamin 1000 MCG tablet Take 1,000 mcg by mouth daily.     ezetimibe (ZETIA) 10 MG tablet Patient knows to take by mouth  !   - Thanks 90 tablet 3   gabapentin (NEURONTIN) 300 MG capsule Take 1 capsule in the morning and afternoon and 2 capsules at night 360 capsule 3   glimepiride (AMARYL) 1 MG tablet Take  1 tab by mouth in the morning if fasting is running 150+. Please do not take this medication if you are ill or not eating as  this can cause a low blood sugar. 90 tablet 1   glucose blood (FREESTYLE TEST STRIPS) test strip USE TO CHECK BLOOD GLUCOSE ONCE DAILY 100 strip 3   hydrochlorothiazide (HYDRODIURIL) 25 MG tablet TAKE ONE TABLET BY MOUTH DAILY FOR FOR BLOOD PRESSURE AND FLUID RETENTION/ ANKLE SWELLING 90 tablet 1   Lancets (FREESTYLE) lancets CHECK BLOOD SUGAR DAILY AS DIRECTED 100 each 3   lidocaine-prilocaine (EMLA) cream Apply to affected area once 30 g 3   loratadine (CLARITIN) 10 MG tablet Take 10 mg by mouth daily as needed for allergies.     Magnesium 250 MG TABS Take 250 mg by mouth every other day.     metoprolol succinate (TOPROL-XL) 25 MG 24 hr tablet Take  1 tablet  Daily  for BP       /TAKE 1 TABLET BY MOUTH DAILY FOR BLOOD PRESSURE 90 tablet 3   omeprazole (PRILOSEC) 20 MG capsule Take 1 capsule (20 mg total) by mouth daily. 30 capsule 11   ondansetron (ZOFRAN) 8 MG tablet Take 1 tablet (8 mg total) by mouth 2 (two) times daily as needed for refractory nausea / vomiting. Start on day 3 after carboplatin chemo. 30 tablet 1   PFIZER-BIONT COVID-19 VAC-TRIS SUSP injection      prochlorperazine (COMPAZINE) 10 MG tablet Take 1 tablet (10 mg total) by mouth every 6 (six) hours as needed for nausea or vomiting. 60 tablet 9   rosuvastatin (CRESTOR) 5 MG tablet Take 1 tablet (5 mg total) by mouth 3 (three) times a week. In the evening for cholesterol. 36 tablet 3   trolamine salicylate (ASPERCREME) 10 % cream Apply 1 application topically as needed for muscle pain (Use if you still feel tingles in feet in the morning after taking Gabapentin).     vitamin C (ASCORBIC ACID) 500 MG tablet Take 500 mg by mouth every evening.      No current facility-administered medications for this visit.   Facility-Administered Medications Ordered in Other Visits  Medication Dose Route Frequency Provider Last Rate Last Admin   sodium chloride flush (NS) 0.9 % injection 10 mL  10 mL Intracatheter PRN [name], Jamaurie Bernier, MD   10 mL at  10/07/21 1319    SUMMARY OF ONCOLOGIC HISTORY: Oncology History Overview Note  HRD positive   Breast cancer of upper-inner quadrant of left female breast ([place])  02/26/2016 Initial Diagnosis   Left breast biopsy 11:00 position: invasive ductal carcinoma with DCIS, ER 90%, PR 10%, HER-2 negative, Ki-67 30%, grade 2, 2.2 cm palpable lesion T2 N0 stage II a clinical stage   03/18/2016 Surgery   Left lumpectomy: Invasive ductal carcinoma, grade 2, 6.3 cm, with high-grade DCIS, margins negative, 0/4 lymph nodes negative, ER 90%, via 10%, HER-2 negative ratio 0.97, Ki-67 30%, T3 N0 stage IIB   03/25/2016 Procedure   Genetic testing is negative for pathogenic mutations within any of the 20 Genes on the breast/ovarian cancer panel   04/04/2016 Oncotype testing   Oncotype DX recurrence score 37, 25% 10 year distant risk of recurrence   04/17/2016 - 07/31/2016 Chemotherapy   Adjuvant chemotherapy with dose dense Adriamycin and Cytoxan followed by Abraxane weekly 8 ( discontinued due to neuropathy)   09/01/2016 - 09/26/2016 Radiation Therapy   Adj XRT 1) Left breast: 42.5 Gy in 17 fractions. 2) Left breast boost:  7.5 Gy in 3 fractions.   12/02/2016 -  Anti-estrogen oral therapy   Anastrozole 1 mg daily   09/05/2021 -  Chemotherapy   Patient is on Treatment Plan : OVARIAN RECURRENT 3RD LINE Carboplatin D1 / Gemcitabine D1,8 (4/800) q21d     Ovarian cancer ([place])  04/03/2020 Imaging   US pelvis Complex cystic and solid mass in LEFT adnexa 12.5 cm diameter question cystic ovarian neoplasm; recommend correlation with serum tumor markers and further evaluation by MR imaging with and without contrast.     04/04/2020 Imaging   US venous Doppler No evidence of deep venous thrombosis in the right lower extremity. Left common femoral vein also patent.   04/15/2020 Imaging   MRI pelvis 1. Large complex solid and cystic mass arising from the right adnexa measuring 10.8 by 11.5 by 11.1 cm. This has an aggressive  appearance with extensive enhancing mural soft tissue components. Findings are highly suspicious for malignant ovarian neoplasm. 2. Extensive bilateral retroperitoneal and bilateral iliac adenopathy compatible with metastatic disease. 3. Signs of extensive peritoneal carcinomatosis including ascites, enhancement, thickening and nodularity of the peritoneal reflections, omental caking and bulky peritoneal nodularity. 4. Suspected serosal involvement of the dome of bladder with loss of normal fat plane. Cannot rule out mural invasion by tumor.   04/17/2020 Tumor Marker   Patient's tumor was tested for the following markers: CA-125 Results of the tumor marker test revealed 1762.   04/20/2020 Cancer Staging   Staging form: Ovary, Fallopian Tube, and Primary Peritoneal Carcinoma, AJCC 8th Edition - Clinical: FIGO Stage IIIC (cT3, cN1, cM0) - Signed by Heath Lark, MD on 04/20/2020    04/23/2020 Imaging   1. Redemonstrated dominant mixed solid and cystic mass arising from the vicinity of the right ovary measuring at least 11.5 x 10.7 cm, not significantly changed compared to prior MR, and consistent with primary ovarian malignancy.  2. Numerous bulky retroperitoneal, bilateral iliac, and pelvic sidewall lymph nodes. 3. Moderate volume ascites throughout the abdomen and pelvis with subtle thickening and nodularity throughout the peritoneum, and extensive bulky nodular metastatic disease of the omentum. 4. Constellation of findings is consistent with advanced nodal and peritoneal metastatic disease. 5. There are prominent subcentimeter epicardial lymph nodes, nonspecific although suspicious for nodal metastatic disease. No definite nodal metastatic disease in the chest. Attention on follow-up. 6. There are multiple small subpleural nodules at the right lung base overlying the diaphragm, measuring up to 7 mm. These are generally nonspecific and less favored to represent pulmonary metastatic disease given  distribution. Attention on follow-up. 7. Somewhat coarse contour of the liver, suggestive of cirrhosis, although out without overt morphologic stigmata. 8. Aortic Atherosclerosis (ICD10-I70.0).     04/24/2020 Procedure   Successful placement of a right internal jugular approach power injectable Port-A-Cath. The catheter is ready for immediate use.     05/02/2020 Tumor Marker   Patient's tumor was tested for the following markers: CA-125 Results of the tumor marker test revealed 1921   05/03/2020 - 12/12/2020 Chemotherapy   The patient had carboplatin and taxol for chemotherapy treatment.     05/08/2020 Procedure   Successful ultrasound-guided paracentesis yielding 2.6 liters of peritoneal fluid.   05/16/2020 Procedure   Successful ultrasound-guided paracentesis yielding 3.2 liters of peritoneal fluid   05/25/2020 Procedure   Successful ultrasound-guided paracentesis yielding 3 liters of peritoneal fluid.     06/01/2020 Procedure   Successful ultrasound-guided therapeutic paracentesis yielding 1.7 liters of peritoneal fluid.   06/14/2020 Tumor Marker   Patient's  tumor was tested for the following markers: CA-125 Results of the tumor marker test revealed 1309   06/20/2020 Imaging   1. Dominant mixed cystic and solid mass in the central pelvis is stable in the interval. 2. Clear interval decrease in retroperitoneal and pelvic sidewall lymphadenopathy. The bulky omental disease has also clearly decreased in the interval. 3. Interval decrease in ascites. 4. Stable 8 mm subpleural nodule along the diaphragm. 5. Aortic Atherosclerosis (ICD10-I70.0).   07/10/2020 Tumor Marker   Patient's tumor was tested for the following markers: CA-125 Results of the tumor marker test revealed 220   08/03/2020 Tumor Marker   Patient's tumor was tested for the following markers: CA-125 Results of the tumor marker test revealed 53.3.   09/03/2020 Tumor Marker   Patient's tumor was tested for the following  markers: CA-125 Results of the tumor marker test revealed 29.7   09/20/2020 Imaging   1. Substantial improvement. The right ovarian cystic and solid mass is half the volume that it measured on 06/20/2020. Similar reduction in the bulk of the omental caking of tumor. Prior ascites is resolved and the retroperitoneal adenopathy is markedly improved. 2. Other imaging findings of potential clinical significance: Stable pleural-based nodularity along the right hemidiaphragm measuring about 1.1 by 0.7 by 0.3 cm. Stable hypodense lesion of the right kidney upper pole, probably a cyst although the configuration of this lesion makes it difficult to obtain accurate density measurements. Left ovarian cyst, stable. Chondrocalcinosis involving the acetabular labra. Mild lumbar spondylosis and degenerative disc disease. 3. Aortic atherosclerosis.     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 (stage ypT3c), predominantly viable with focal fibrosis and hemosiderin laden macrophages (possible mild treatment effect)  B: Ovary and fallopian tube, right, salpingo-oophorectomy - High grade serous carcinoma involving right ovary and fallopian tube with surface involvement, size up to 8 cm  - Areas of necrosis and hemosiderin laden macrophages suggestive of treatment effect - 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 - See synoptic report and comment  C: Ovary and fallopian tube, left, salpingo-oophorectomy - High grade serous carcinoma involving the left fallopian tube, size up to 0.5 cm - Ovary with no definite involvement by carcinoma identified - Nodular area of endometriosis and peritoneal inclusion cyst also present - See synoptic report  and comment   Procedure:    Bilateral salpingo-oophorectomy    Procedure:    Omentectomy    Specimen Integrity of Right Ovary:    Intact with surface involvement by tumor    Specimen Integrity of Left Ovary:    Capsule intact   TUMOR Tumor Site:    Right fallopian tube: favor as primary site; also involves right ovary and left fallopian tube  Histologic Type:    Serous carcinoma  Histologic Grade:    High grade  Tumor Size:    Greatest Dimension (Centimeters): in fallopian tube: 1.0 cm; in ovary: 8.0 cm Ovarian Surface Involvement:    Present    Laterality:    Right  Fallopian Tube Surface Involvement:    Present    Laterality:    Bilateral  Other Tissue / Organ Involvement:    Right ovary  Other Tissue / Organ Involvement:    Right fallopian tube  Other Tissue / Organ Involvement:    Left fallopian tube  Other Tissue / Organ Involvement:    Omentum  Largest Extrapelvic Peritoneal Focus:    Macroscopic (greater than 2 cm)  Peritoneal / Ascitic Fluid:    Not submitted / unknown  Pleural Fluid:    Not submitted / unknown  Treatment Effect:    No definite or minimal response identified (chemotherapy response score [CRS] 1)   LYMPH NODES Regional Lymph Nodes:    No lymph nodes submitted or found   PATHOLOGIC STAGE CLASSIFICATION (pTNM, AJCC 8th Edition) TNM Descriptors:    y (post-treatment)  Primary Tumor (pT):    pT3c  Regional Lymph Nodes (pN):    pNX   FIGO STAGE FIGO Stage:    IIIC   ADDITIONAL FINDINGS Additional Findings:    Serous tubal intraepithelial carcinoma (STIC)  Additional Findings:    Left ovary with no definite carcinoma identified, left-sided nodule of endometriosis and peritoneal inclusion cyst    10/18/2020 - 10/20/2020 Hospital Admission   She was admitted to [place] for interval debulking surgery   11/19/2020 Tumor Marker   Patient's tumor was tested for the following markers: CA-125. Results of the tumor marker test revealed 38.3   12/12/2020  Tumor Marker   Patient's tumor was tested for the following markers CA-125. Results of the tumor marker test revealed 42.3.   01/02/2021 Tumor Marker   Patient's tumor was tested for the following markers: CA-125. Results of the tumor marker test revealed 37.3   01/29/2021 Tumor Marker   Patient's tumor was tested for the following markers: CA-125 Results of the tumor marker test revealed 44.3   01/29/2021 Imaging   1. Interval increase in loculated appearing ascites in the low central abdomen and pelvis. 2. There is extensive peritoneal nodularity surrounding this fluid, the nodularity itself not appreciably changed in appearance compared to prior examination. 3. Additional peritoneal nodularity and/or mesenteric lymph nodes are unchanged. 4. Interval decrease in size of a low-attenuation nodule overlying the right hemidiaphragm, now measuring 1.2 cm, previously 1.8 cm when measured similarly. Findings are consistent with treatment response of a metastatic nodule. No other evidence of intrathoracic metastatic disease. 5. Status post hysterectomy, oophorectomy, and omentectomy.   01/31/2021 - 08/26/2021 Chemotherapy   She is started on Olaparib       03/04/2021 Tumor Marker   Patient's tumor was tested for the following markers: CA-125 Results of the tumor marker test revealed 39.7   04/01/2021 Tumor Marker   Patient's tumor was tested for the following markers: CA-125 Results of the tumor marker test revealed 32.3   05/10/2021 Imaging   1. No significant change in peritoneal carcinomatosis since previous study of 3 months ago, primarily in the pelvis where there is complex fluid and peritoneal nodularity. 2. No evidence of solid visceral organ metastasis. No evidence of bowel or ureteral obstruction. 3.  Aortic Atherosclerosis (ICD10-I70.0).   06/13/2021 Tumor Marker   Patient's tumor was tested for the following markers: CA-125. Results of the tumor marker test revealed 48.3.   07/29/2021  Tumor Marker   Patient's tumor was tested for the following markers: CA-125. Results of the tumor marker test revealed 54.   08/25/2021 Imaging   1. There is extensive peritoneal soft tissue thickening and nodularity, particularly about the low right hemipelvis, which is similar to prior examination. Some small peritoneal nodules throughout the abdomen and pelvis are slightly enlarged, other nodules are unchanged. 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 2. No significant change in a subpleural nodule overlying  the right hemidiaphragm. 3. Unchanged, loculated small volume pelvic ascites. 4. Status post hysterectomy, oophorectomy, and omentectomy. 5. Coronary artery disease.   Aortic Atherosclerosis (ICD10-I70.0).     08/26/2021 Tumor Marker   Patient's tumor was tested for the following markers: CA-125. Results of the tumor marker test revealed 56.   09/04/2021 Tumor Marker   Patient's tumor was tested for the following markers: CA-125. Results of the tumor marker test revealed 55.4.   09/05/2021 -  Chemotherapy   Patient is on Treatment Plan : OVARIAN RECURRENT 3RD LINE Carboplatin D1 / Gemcitabine D1,8 (4/800) q21d       PHYSICAL EXAMINATION: ECOG PERFORMANCE STATUS: 1 - Symptomatic but completely ambulatory  Vitals:   10/07/21 0951  BP: (!) 141/66  Pulse: 85  Resp: 18  Temp: 97.6 F (36.4 C)  SpO2: 99%   Filed Weights   10/07/21 0951  Weight: 143 lb 3.2 oz (65 kg)    GENERAL:alert, no distress and comfortable NEURO: alert & oriented x 3 with fluent speech, no focal motor/sensory deficits  LABORATORY DATA:  I have reviewed the data as listed    Component Value Date/Time   NA 142 10/07/2021 0935   NA 141 11/06/2016 1102   K 3.8 10/07/2021 0935   K 3.9 11/06/2016 1102   CL 107 10/07/2021 0935   CO2 28 10/07/2021 0935   CO2 27 11/06/2016 1102   GLUCOSE 116 (H) 10/07/2021 0935   GLUCOSE 130 11/06/2016 1102   BUN 16  10/07/2021 0935   BUN 18.0 11/06/2016 1102   CREATININE 1.16 (H) 10/07/2021 0935   CREATININE 1.25 (H) 09/23/2021 0828   CREATININE 1.42 (H) 02/21/2021 1414   CREATININE 1.3 (H) 11/06/2016 1102   CALCIUM 9.4 10/07/2021 0935   CALCIUM 10.2 11/06/2016 1102   PROT 6.7 10/07/2021 0935   PROT 7.1 11/06/2016 1102   ALBUMIN 3.4 (L) 10/07/2021 0935   ALBUMIN 3.7 11/06/2016 1102   AST 37 10/07/2021 0935   AST 33 09/23/2021 0828   AST 52 (H) 11/06/2016 1102   ALT 22 10/07/2021 0935   ALT 20 09/23/2021 0828   ALT 58 (H) 11/06/2016 1102   ALKPHOS 111 10/07/2021 0935   ALKPHOS 159 (H) 11/06/2016 1102   BILITOT 0.5 10/07/2021 0935   BILITOT 0.5 09/23/2021 0828   BILITOT 0.53 11/06/2016 1102   GFRNONAA 47 (L) 10/07/2021 0935   GFRNONAA 43 (L) 09/23/2021 0828   GFRNONAA 34 (L) 02/21/2021 1414   GFRAA 40 (L) 02/21/2021 1414    No results found for: SPEP, UPEP  Lab Results  Component Value Date   WBC 4.3 10/07/2021   NEUTROABS 2.6 10/07/2021   HGB 9.5 (L) 10/07/2021   HCT 28.2 (L) 10/07/2021   MCV 94.9 10/07/2021   PLT 107 (L) 10/07/2021      Chemistry      Component Value Date/Time   NA 142 10/07/2021 0935   NA 141 11/06/2016 1102   K 3.8 10/07/2021 0935   K 3.9 11/06/2016 1102   CL 107 10/07/2021 0935   CO2 28 10/07/2021 0935   CO2 27 11/06/2016 1102   BUN 16 10/07/2021 0935   BUN 18.0 11/06/2016 1102   CREATININE 1.16 (H) 10/07/2021 0935   CREATININE 1.25 (H) 09/23/2021 0828   CREATININE 1.42 (H) 02/21/2021 1414   CREATININE 1.3 (H) 11/06/2016 1102      Component Value Date/Time   CALCIUM 9.4 10/07/2021 0935   CALCIUM 10.2 11/06/2016 1102   ALKPHOS 111 10/07/2021 0935  ALKPHOS 159 (H) 11/06/2016 1102   AST 37 10/07/2021 0935   AST 33 09/23/2021 0828   AST 52 (H) 11/06/2016 1102   ALT 22 10/07/2021 0935   ALT 20 09/23/2021 0828   ALT 58 (H) 11/06/2016 1102   BILITOT 0.5 10/07/2021 0935   BILITOT 0.5 09/23/2021 0828   BILITOT 0.53 11/06/2016 1102

## 2021-10-07 NOTE — Assessment & Plan Note (Signed)
Her pancytopenia has improved since she had extra week of break We will proceed with treatment today  I recommend minimum 3 cycles of treatment before repeating imaging study In the future, her treatment cycle will be every other week

## 2021-10-07 NOTE — Patient Instructions (Signed)
[facility]  Discharge Instructions: Thank you for choosing [facility] to provide your oncology and hematology care.   If you have a lab appointment with the [facility], please go directly to the [facility] and check in at the registration area.   Wear comfortable clothing and clothing appropriate for easy access to any Portacath or PICC line.   We strive to give you quality time with your provider. You may need to reschedule your appointment if you arrive late (15 or more minutes).  Arriving late affects you and other patients whose appointments are after yours.  Also, if you miss three or more appointments without notifying the office, you may be dismissed from the clinic at the provider's discretion.      For prescription refill requests, have your pharmacy contact our office and allow 72 hours for refills to be completed.    Today you received the following chemotherapy and/or immunotherapy agents: Gemzar and Carboplatin      To help prevent nausea and vomiting after your treatment, we encourage you to take your nausea medication as directed.  BELOW ARE SYMPTOMS THAT SHOULD BE REPORTED IMMEDIATELY: *FEVER GREATER THAN 100.4 F (38 C) OR HIGHER *CHILLS OR SWEATING *NAUSEA AND VOMITING THAT IS NOT CONTROLLED WITH YOUR NAUSEA MEDICATION *UNUSUAL SHORTNESS OF BREATH *UNUSUAL BRUISING OR BLEEDING *URINARY PROBLEMS (pain or burning when urinating, or frequent urination) *BOWEL PROBLEMS (unusual diarrhea, constipation, pain near the anus) TENDERNESS IN MOUTH AND THROAT WITH OR WITHOUT PRESENCE OF ULCERS (sore throat, sores in mouth, or a toothache) UNUSUAL RASH, SWELLING OR PAIN  UNUSUAL VAGINAL DISCHARGE OR ITCHING   Items with * indicate a potential emergency and should be followed up as soon as possible or go to the Emergency Department if any problems should occur.  Please show the CHEMOTHERAPY ALERT CARD or IMMUNOTHERAPY ALERT CARD at  check-in to the Emergency Department and triage nurse.  Should you have questions after your visit or need to cancel or reschedule your appointment, please contact [facility]  Dept: [phone]  and follow the prompts.  Office hours are 8:00 a.m. to 4:30 p.m. Monday - Friday. Please note that voicemails left after 4:00 p.m. may not be returned until the following business day.  We are closed weekends and major holidays. You have access to a nurse at all times for urgent questions. Please call the main number to the clinic Dept: [phone] and follow the prompts.   For any non-urgent questions, you may also contact your provider using MyChart. We now offer e-Visits for anyone 83 and older to request care online for non-urgent symptoms. For details visit [email or web address].   Also download the MyChart app! Go to the app store, search "MyChart", open the app, select [facility], and log in with your MyChart username and password.  Due to Covid, a mask is required upon entering the hospital/clinic. If you do not have a mask, one will be given to you upon arrival. For doctor visits, patients may have 1 support person aged 83 or older with them. For treatment visits, patients cannot have anyone with them due to current Covid guidelines and our immunocompromised population.

## 2021-10-07 NOTE — Assessment & Plan Note (Signed)
This is due to side effects of chemotherapy She does not need transfusion support We will space out her appointment as above

## 2021-10-07 NOTE — Assessment & Plan Note (Signed)
She has stable chronic kidney disease stage III Observe closely

## 2021-10-08 LAB — CA 125: Cancer Antigen (CA) 125: 51.9 U/mL — ABNORMAL HIGH (ref 0.0–38.1)

## 2021-10-16 ENCOUNTER — Ambulatory Visit
Admission: RE | Admit: 2021-10-16 | Discharge: 2021-10-16 | Disposition: A | Discharge status: Home / Self Care | Payer: PPO | Source: Ambulatory Visit | Attending: Adult Health | Admitting: Adult Health

## 2021-10-16 ENCOUNTER — Other Ambulatory Visit: Payer: Self-pay

## 2021-10-16 DIAGNOSIS — Z780 Asymptomatic menopausal state: Secondary | ICD-10-CM | POA: Diagnosis not present

## 2021-10-16 DIAGNOSIS — M8580 Other specified disorders of bone density and structure, unspecified site: Secondary | ICD-10-CM

## 2021-10-16 DIAGNOSIS — M8589 Other specified disorders of bone density and structure, multiple sites: Secondary | ICD-10-CM | POA: Diagnosis not present

## 2021-10-17 ENCOUNTER — Telehealth: Payer: Self-pay

## 2021-10-17 NOTE — Telephone Encounter (Signed)
Pt called to ask if she should be taking Dexamethason PO tablets, as she called to refill but they had been discontinued. Advised pt that she received IV Dex now and she can f/u with Dr [name] if she has concerns. Pt verbalized thanks and understanding.

## 2021-10-21 ENCOUNTER — Inpatient Hospital Stay: Payer: PPO

## 2021-10-21 ENCOUNTER — Other Ambulatory Visit: Payer: Self-pay

## 2021-10-21 ENCOUNTER — Inpatient Hospital Stay (HOSPITAL_BASED_OUTPATIENT_CLINIC_OR_DEPARTMENT_OTHER): Payer: PPO

## 2021-10-21 VITALS — BP 134/64 | HR 71 | Temp 98.0°F | Resp 18

## 2021-10-21 DIAGNOSIS — C569 Malignant neoplasm of unspecified ovary: Secondary | ICD-10-CM

## 2021-10-21 DIAGNOSIS — Z95828 Presence of other vascular implants and grafts: Secondary | ICD-10-CM

## 2021-10-21 DIAGNOSIS — C50212 Malignant neoplasm of upper-inner quadrant of left female breast: Secondary | ICD-10-CM

## 2021-10-21 DIAGNOSIS — Z5111 Encounter for antineoplastic chemotherapy: Secondary | ICD-10-CM | POA: Diagnosis not present

## 2021-10-21 DIAGNOSIS — Z170 Estrogen receptor positive status [ER+]: Secondary | ICD-10-CM

## 2021-10-21 LAB — CBC WITH DIFFERENTIAL (CANCER CENTER ONLY)
Abs Immature Granulocytes: 0.02 10*3/uL (ref 0.00–0.07)
Basophils Absolute: 0 10*3/uL (ref 0.0–0.1)
Basophils Relative: 0 %
Eosinophils Absolute: 0 10*3/uL (ref 0.0–0.5)
Eosinophils Relative: 1 %
HCT: 26.1 % — ABNORMAL LOW (ref 36.0–46.0)
Hemoglobin: 8.8 g/dL — ABNORMAL LOW (ref 12.0–15.0)
Immature Granulocytes: 1 %
Lymphocytes Relative: 32 %
Lymphs Abs: 0.9 10*3/uL (ref 0.7–4.0)
MCH: 32.1 pg (ref 26.0–34.0)
MCHC: 33.7 g/dL (ref 30.0–36.0)
MCV: 95.3 fL (ref 80.0–100.0)
Monocytes Absolute: 0.4 10*3/uL (ref 0.1–1.0)
Monocytes Relative: 15 %
Neutro Abs: 1.5 10*3/uL — ABNORMAL LOW (ref 1.7–7.7)
Neutrophils Relative %: 51 %
Platelet Count: 58 10*3/uL — ABNORMAL LOW (ref 150–400)
RBC: 2.74 MIL/uL — ABNORMAL LOW (ref 3.87–5.11)
RDW: 17.9 % — ABNORMAL HIGH (ref 11.5–15.5)
WBC Count: 2.9 10*3/uL — ABNORMAL LOW (ref 4.0–10.5)
nRBC: 0 % (ref 0.0–0.2)

## 2021-10-21 LAB — CMP (CANCER CENTER ONLY)
ALT: 35 U/L (ref 0–44)
AST: 43 U/L — ABNORMAL HIGH (ref 15–41)
Albumin: 3.5 g/dL (ref 3.5–5.0)
Alkaline Phosphatase: 104 U/L (ref 38–126)
Anion gap: 5 (ref 5–15)
BUN: 28 mg/dL — ABNORMAL HIGH (ref 8–23)
CO2: 29 mmol/L (ref 22–32)
Calcium: 9.6 mg/dL (ref 8.9–10.3)
Chloride: 108 mmol/L (ref 98–111)
Creatinine: 1.09 mg/dL — ABNORMAL HIGH (ref 0.44–1.00)
GFR, Estimated: 50 mL/min — ABNORMAL LOW (ref >60)
Glucose, Bld: 94 mg/dL (ref 70–99)
Potassium: 3.8 mmol/L (ref 3.5–5.1)
Sodium: 142 mmol/L (ref 135–145)
Total Bilirubin: 0.7 mg/dL (ref 0.3–1.2)
Total Protein: 6.8 g/dL (ref 6.5–8.1)

## 2021-10-21 MED ORDER — SODIUM CHLORIDE 0.9% FLUSH
10.0000 mL | Freq: Once | INTRAVENOUS | Status: AC
  Administered 2021-10-21: 10 mL

## 2021-10-21 MED ORDER — HEPARIN SOD (PORK) LOCK FLUSH 100 UNIT/ML IV SOLN
500.0000 [IU] | Freq: Once | INTRAVENOUS | Status: AC
  Administered 2021-10-21: 500 [IU]

## 2021-10-21 NOTE — Progress Notes (Signed)
Per Dr. [name] - treatment will be held today d/t low platelet count. This treatment will be omitted and will not need to be rescheduled. Infusion nurse made aware.

## 2021-10-21 NOTE — Patient Instructions (Signed)
Implanted [facility] Guide An implanted port is a device that is placed under the skin. It is usually placed in the chest. The device may vary based on the need. Implanted ports can be used to give IV medicine, to take blood, or to give fluids. You may have an implanted port if: You need IV medicine that would be irritating to the small veins in your hands or arms. You need IV medicines, such as chemotherapy, for a long period of time. You need IV nutrition for a long period of time. You may have fewer limitations when using a port than you would if you used other types of long-term IVs. You will also likely be able to return to normal activities after your incision heals. An implanted port has two main parts: Reservoir. The reservoir is the part where a needle is inserted to give medicines or draw blood. The reservoir is round. After the port is placed, it appears as a small, raised area under your skin. Catheter. The catheter is a small, thin tube that connects the reservoir to a vein. Medicine that is inserted into the reservoir goes into the catheter and then into the vein. How is my port accessed? To access your port: A numbing cream may be placed on the skin over the port site. Your health care provider will put on a mask and sterile gloves. The skin over your port will be cleaned carefully with a germ-killing soap and allowed to dry. Your health care provider will gently pinch the port and insert a needle into it. Your health care provider will check for a blood return to make sure the port is in the vein and is still working (patent). If your port needs to remain accessed to get medicine continuously (constant infusion), your health care provider will place a clear bandage (dressing) over the needle site. The dressing and needle will need to be changed every week, or as told by your health care provider. What is flushing? Flushing helps keep the port working. Follow instructions from your  health care provider about how and when to flush the port. Ports are usually flushed with saline solution or a medicine called heparin. The need for flushing will depend on how the port is used: If the port is only used from time to time to give medicines or draw blood, the port may need to be flushed: Before and after medicines have been given. Before and after blood has been drawn. As part of routine maintenance. Flushing may be recommended every 4-6 weeks. If a constant infusion is running, the port may not need to be flushed. Throw away any syringes in a disposal container that is meant for sharp items (sharps container). You can buy a sharps container from a pharmacy, or you can make one by using an empty hard plastic bottle with a cover. How long will my port stay implanted? The port can stay in for as long as your health care provider thinks it is needed. When it is time for the port to come out, a surgery will be done to remove it. The surgery will be similar to the procedure that was done to put the port in. Follow these instructions at home: Caring for your port and port site Flush your port as told by your health care provider. If you need an infusion over several days, follow instructions from your health care provider about how to take care of your port site. Make sure you: Change your  dressing as told by your health care provider. Wash your hands with soap and water for at least 20 seconds before and after you change your dressing. If soap and water are not available, use alcohol-based hand sanitizer. Place any used dressings or infusion bags into a plastic bag. Throw that bag in the trash. Keep the dressing that covers the needle clean and dry. Do not get it wet. Do not use scissors or sharp objects near the infusion tubing. Keep any external tubes clamped, unless they are being used. Check your port site every day for signs of infection. Check for: Redness, swelling, or  pain. Fluid or blood. Warmth. Pus or a bad smell. Protect the skin around the port site. Avoid wearing bra straps that rub or irritate the site. Protect the skin around your port from seat belts. Place a soft pad over your chest if needed. Bathe or shower as told by your health care provider. The site may get wet as long as you are not actively receiving an infusion. General instructions  Return to your normal activities as told by your health care provider. Ask your health care provider what activities are safe for you. Carry a medical alert card or wear a medical alert bracelet at all times. This will let health care providers know that you have an implanted port in case of an emergency. Where to find more information American [employer] Society: www.cancer.[place] of Clinical Oncology: www.cancer.net Contact a health care provider if: You have a fever or chills. You have redness, swelling, or pain at the port site. You have fluid or blood coming from your port site. Your incision feels warm to the touch. You have pus or a bad smell coming from the port site. Summary Implanted ports are usually placed in the chest for long-term IV access. Follow instructions from your health care provider about flushing the port and changing bandages (dressings). Take care of the area around your port by avoiding clothing that puts pressure on the area, and by watching for signs of infection. Protect the skin around your port from seat belts. Place a soft pad over your chest if needed. Contact a health care provider if you have a fever or you have redness, swelling, pain, fluid, or a bad smell at the port site. This information is not intended to replace advice given to you by your health care provider. Make sure you discuss any questions you have with your health care provider. Document Revised: 05/21/2021 Document Reviewed: 05/21/2021 Elsevier Patient Education  [place].

## 2021-10-29 DIAGNOSIS — L821 Other seborrheic keratosis: Secondary | ICD-10-CM | POA: Diagnosis not present

## 2021-10-29 DIAGNOSIS — L820 Inflamed seborrheic keratosis: Secondary | ICD-10-CM | POA: Diagnosis not present

## 2021-10-29 DIAGNOSIS — D485 Neoplasm of uncertain behavior of skin: Secondary | ICD-10-CM | POA: Diagnosis not present

## 2021-11-05 ENCOUNTER — Encounter: Payer: Self-pay | Admitting: Hematology and Oncology

## 2021-11-05 ENCOUNTER — Other Ambulatory Visit: Payer: Self-pay | Admitting: Hematology and Oncology

## 2021-11-05 ENCOUNTER — Inpatient Hospital Stay: Payer: PPO | Attending: Gynecologic Oncology

## 2021-11-05 ENCOUNTER — Inpatient Hospital Stay: Payer: PPO

## 2021-11-05 ENCOUNTER — Inpatient Hospital Stay: Payer: PPO | Admitting: Hematology and Oncology

## 2021-11-05 ENCOUNTER — Other Ambulatory Visit: Payer: Self-pay

## 2021-11-05 VITALS — BP 126/64 | HR 76 | Temp 97.7°F | Resp 18 | Ht 64.0 in | Wt 142.0 lb

## 2021-11-05 DIAGNOSIS — Z95828 Presence of other vascular implants and grafts: Secondary | ICD-10-CM

## 2021-11-05 DIAGNOSIS — C569 Malignant neoplasm of unspecified ovary: Secondary | ICD-10-CM

## 2021-11-05 DIAGNOSIS — S72001A Fracture of unspecified part of neck of right femur, initial encounter for closed fracture: Secondary | ICD-10-CM | POA: Diagnosis not present

## 2021-11-05 DIAGNOSIS — E1122 Type 2 diabetes mellitus with diabetic chronic kidney disease: Secondary | ICD-10-CM | POA: Diagnosis not present

## 2021-11-05 DIAGNOSIS — S72011A Unspecified intracapsular fracture of right femur, initial encounter for closed fracture: Secondary | ICD-10-CM | POA: Diagnosis not present

## 2021-11-05 DIAGNOSIS — C50212 Malignant neoplasm of upper-inner quadrant of left female breast: Secondary | ICD-10-CM

## 2021-11-05 DIAGNOSIS — N183 Chronic kidney disease, stage 3 unspecified: Secondary | ICD-10-CM | POA: Diagnosis not present

## 2021-11-05 DIAGNOSIS — Z170 Estrogen receptor positive status [ER+]: Secondary | ICD-10-CM

## 2021-11-05 DIAGNOSIS — D61818 Other pancytopenia: Secondary | ICD-10-CM

## 2021-11-05 DIAGNOSIS — C561 Malignant neoplasm of right ovary: Secondary | ICD-10-CM | POA: Insufficient documentation

## 2021-11-05 DIAGNOSIS — N1831 Chronic kidney disease, stage 3a: Secondary | ICD-10-CM | POA: Insufficient documentation

## 2021-11-05 DIAGNOSIS — Z79899 Other long term (current) drug therapy: Secondary | ICD-10-CM | POA: Insufficient documentation

## 2021-11-05 DIAGNOSIS — E118 Type 2 diabetes mellitus with unspecified complications: Secondary | ICD-10-CM | POA: Insufficient documentation

## 2021-11-05 LAB — CMP (CANCER CENTER ONLY)
ALT: 20 U/L (ref 0–44)
AST: 39 U/L (ref 15–41)
Albumin: 3.6 g/dL (ref 3.5–5.0)
Alkaline Phosphatase: 115 U/L (ref 38–126)
Anion gap: 10 (ref 5–15)
BUN: 21 mg/dL (ref 8–23)
CO2: 25 mmol/L (ref 22–32)
Calcium: 9.5 mg/dL (ref 8.9–10.3)
Chloride: 107 mmol/L (ref 98–111)
Creatinine: 1.25 mg/dL — ABNORMAL HIGH (ref 0.44–1.00)
GFR, Estimated: 43 mL/min — ABNORMAL LOW (ref >60)
Glucose, Bld: 124 mg/dL — ABNORMAL HIGH (ref 70–99)
Potassium: 3.8 mmol/L (ref 3.5–5.1)
Sodium: 142 mmol/L (ref 135–145)
Total Bilirubin: 0.6 mg/dL (ref 0.3–1.2)
Total Protein: 7 g/dL (ref 6.5–8.1)

## 2021-11-05 LAB — CBC WITH DIFFERENTIAL (CANCER CENTER ONLY)
Abs Immature Granulocytes: 0.03 10*3/uL (ref 0.00–0.07)
Basophils Absolute: 0 10*3/uL (ref 0.0–0.1)
Basophils Relative: 1 %
Eosinophils Absolute: 0.1 10*3/uL (ref 0.0–0.5)
Eosinophils Relative: 1 %
HCT: 30.4 % — ABNORMAL LOW (ref 36.0–46.0)
Hemoglobin: 9.9 g/dL — ABNORMAL LOW (ref 12.0–15.0)
Immature Granulocytes: 1 %
Lymphocytes Relative: 36 %
Lymphs Abs: 1.5 10*3/uL (ref 0.7–4.0)
MCH: 32 pg (ref 26.0–34.0)
MCHC: 32.6 g/dL (ref 30.0–36.0)
MCV: 98.4 fL (ref 80.0–100.0)
Monocytes Absolute: 0.7 10*3/uL (ref 0.1–1.0)
Monocytes Relative: 17 %
Neutro Abs: 1.8 10*3/uL (ref 1.7–7.7)
Neutrophils Relative %: 44 %
Platelet Count: 112 10*3/uL — ABNORMAL LOW (ref 150–400)
RBC: 3.09 MIL/uL — ABNORMAL LOW (ref 3.87–5.11)
RDW: 18.6 % — ABNORMAL HIGH (ref 11.5–15.5)
WBC Count: 4.1 10*3/uL (ref 4.0–10.5)
nRBC: 0 % (ref 0.0–0.2)

## 2021-11-05 MED ORDER — SODIUM CHLORIDE 0.9 % IV SOLN: Freq: Once | INTRAVENOUS | Status: AC

## 2021-11-05 MED ORDER — SODIUM CHLORIDE 0.9 % IV SOLN
237.6000 mg | Freq: Once | INTRAVENOUS | Status: AC
  Administered 2021-11-05: 240 mg via INTRAVENOUS
  Filled 2021-11-05: qty 24

## 2021-11-05 MED ORDER — PALONOSETRON HCL INJECTION 0.25 MG/5ML
0.2500 mg | Freq: Once | INTRAVENOUS | Status: AC
  Administered 2021-11-05: 0.25 mg via INTRAVENOUS
  Filled 2021-11-05: qty 5

## 2021-11-05 MED ORDER — FAMOTIDINE 20 MG IN NS 100 ML IVPB
20.0000 mg | Freq: Once | INTRAVENOUS | Status: AC
  Administered 2021-11-05: 20 mg via INTRAVENOUS
  Filled 2021-11-05: qty 100

## 2021-11-05 MED ORDER — DIPHENHYDRAMINE HCL 50 MG/ML IJ SOLN
25.0000 mg | Freq: Once | INTRAMUSCULAR | Status: AC
  Administered 2021-11-05: 25 mg via INTRAVENOUS
  Filled 2021-11-05: qty 1

## 2021-11-05 MED ORDER — HEPARIN SOD (PORK) LOCK FLUSH 100 UNIT/ML IV SOLN
500.0000 [IU] | Freq: Once | INTRAVENOUS | Status: AC | PRN
  Administered 2021-11-05: 500 [IU]

## 2021-11-05 MED ORDER — SODIUM CHLORIDE 0.9 % IV SOLN
500.0000 mg/m2 | Freq: Once | INTRAVENOUS | Status: AC
  Administered 2021-11-05: 836 mg via INTRAVENOUS
  Filled 2021-11-05: qty 21.99

## 2021-11-05 MED ORDER — SODIUM CHLORIDE 0.9 % IV SOLN
10.0000 mg | Freq: Once | INTRAVENOUS | Status: AC
  Administered 2021-11-05: 10 mg via INTRAVENOUS
  Filled 2021-11-05: qty 10

## 2021-11-05 MED ORDER — SODIUM CHLORIDE 0.9 % IV SOLN
150.0000 mg | Freq: Once | INTRAVENOUS | Status: AC
  Administered 2021-11-05: 150 mg via INTRAVENOUS
  Filled 2021-11-05: qty 150

## 2021-11-05 MED ORDER — SODIUM CHLORIDE 0.9% FLUSH
10.0000 mL | INTRAVENOUS | Status: DC | PRN
  Administered 2021-11-05: 10 mL

## 2021-11-05 MED ORDER — SODIUM CHLORIDE 0.9% FLUSH
10.0000 mL | Freq: Once | INTRAVENOUS | Status: AC
  Administered 2021-11-05: 10 mL

## 2021-11-05 NOTE — Assessment & Plan Note (Signed)
Overall, her significant pancytopenia caused recent omission of day 8 gemcitabine I recommend we modify her treatment further We will simplify her chemotherapy regimen to carboplatin and gemcitabine on day 1 every 3 weeks She will start new cycle today I plan to repeat imaging study at the end of the month for objective assessment of response to therapy She is in agreement

## 2021-11-05 NOTE — Assessment & Plan Note (Signed)
She has prolonged pancytopenia after each cycle of treatment She does not need transfusion support We will modify her treatment schedule further as above

## 2021-11-05 NOTE — Progress Notes (Signed)
[facility] OFFICE PROGRESS NOTE  Patient Care Team: [name], MD as PCP - Kinnie Scales, OD as Referring Physician (Optometry) [name], MD as Consulting Physician (Dermatology) Latanya Maudlin, MD as Consulting Physician (Orthopedic Surgery) Inda Castle, MD (Inactive) as Consulting Physician (Gastroenterology) Jacqulyn Liner, RN as Oncology Nurse Navigator (Oncology) Heath Lark, MD as Consulting Physician (Hematology and Oncology) Madelon Lips, MD as Consulting Physician (Nephrology) Rush Landmark, [facility] as Pharmacist (Pharmacist)  ASSESSMENT & PLAN:  Ovarian cancer [facility]) Overall, her significant pancytopenia caused recent omission of day 8 gemcitabine I recommend we modify her treatment further We will simplify her chemotherapy regimen to carboplatin and gemcitabine on day 1 every 3 weeks She will start new cycle today I plan to repeat imaging study at the end of the month for objective assessment of response to therapy She is in agreement  Pancytopenia, acquired [facility]) She has prolonged pancytopenia after each cycle of treatment She does not need transfusion support We will modify her treatment schedule further as above  CKD stage 3 due to type 2 diabetes mellitus ([place]) She has stable chronic kidney disease stage III Observe closely Will adjust the dose of carboplatin accordingly  Orders Placed This Encounter  Procedures   CT ABDOMEN PELVIS W CONTRAST    Standing Status:   Future    Standing Expiration Date:   11/05/2022    Order Specific Question:   If indicated for the ordered procedure, I authorize the administration of contrast media per Radiology protocol    Answer:   Yes    Order Specific Question:   Preferred imaging location?    Answer:   [facility]    Order Specific Question:   Radiology Contrast Protocol - do NOT remove file path    Answer:   [email or web address]    All  questions were answered. The patient knows to call the clinic with any problems, questions or concerns. The total time spent in the appointment was 30 minutes encounter with patients including review of chart and various tests results, discussions about plan of care and coordination of care plan   Heath Lark, MD 11/05/2021 2:36 PM  INTERVAL HISTORY: Please see below for problem oriented charting. she returns for treatment follow-up with her husband, on carboplatin and gemcitabine for recurrent ovarian cancer She missed her day 8 treatment 2 weeks ago due to severe pancytopenia She is completely asymptomatic The patient denies any recent signs or symptoms of bleeding such as spontaneous epistaxis, hematuria or hematochezia. She denies abdominal pain, bloating or changes in bowel habits  REVIEW OF SYSTEMS:   Constitutional: Denies fevers, chills or abnormal weight loss Eyes: Denies blurriness of vision Ears, nose, mouth, throat, and face: Denies mucositis or sore throat Respiratory: Denies cough, dyspnea or wheezes Cardiovascular: Denies palpitation, chest discomfort or lower extremity swelling Gastrointestinal:  Denies nausea, heartburn or change in bowel habits Skin: Denies abnormal skin rashes Lymphatics: Denies new lymphadenopathy or easy bruising Neurological:Denies numbness, tingling or new weaknesses Behavioral/Psych: Mood is stable, no new changes  All other systems were reviewed with the patient and are negative.  I have reviewed the past medical history, past surgical history, social history and family history with the patient and they are unchanged from previous note.  ALLERGIES:  is allergic to keflex [cephalexin], meloxicam, prednisone, premarin [estrogens conjugated], and trovan [alatrofloxacin].  MEDICATIONS:  Current Outpatient Medications  Medication Sig Dispense Refill   acetaminophen (TYLENOL) 325 MG tablet Take 325 mg by mouth  every 6 (six) hours as needed for  headache.     allopurinol (ZYLOPRIM) 300 MG tablet TAKE 1 TABLET BY MOUTH DAILY FOR GOUT PREVENTION (Patient taking differently: TAKE one-half TABLET BY MOUTH DAILY FOR GOUT PREVENTION,) 90 tablet 1   blood glucose meter kit and supplies KIT Dispense based on patient and insurance preference. Use to check blood sugar once daily. Dx: E11.22, N18.30 1 each 12   Cholecalciferol (VITAMIN D3) 5000 units CAPS Take 5,000 Units by mouth daily.     cyanocobalamin 1000 MCG tablet Take 1,000 mcg by mouth daily.     ezetimibe (ZETIA) 10 MG tablet Patient knows to take by mouth  !   - Thanks 90 tablet 3   gabapentin (NEURONTIN) 300 MG capsule Take 1 capsule in the morning and afternoon and 2 capsules at night 360 capsule 3   glimepiride (AMARYL) 1 MG tablet Take 1 tab by mouth in the morning if fasting is running 150+. Please do not take this medication if you are ill or not eating as this can cause a low blood sugar. 90 tablet 1   glucose blood (FREESTYLE TEST STRIPS) test strip USE TO CHECK BLOOD GLUCOSE ONCE DAILY 100 strip 3   hydrochlorothiazide (HYDRODIURIL) 25 MG tablet TAKE ONE TABLET BY MOUTH DAILY FOR FOR BLOOD PRESSURE AND FLUID RETENTION/ ANKLE SWELLING 90 tablet 1   Lancets (FREESTYLE) lancets CHECK BLOOD SUGAR DAILY AS DIRECTED 100 each 3   lidocaine-prilocaine (EMLA) cream Apply to affected area once 30 g 3   loratadine (CLARITIN) 10 MG tablet Take 10 mg by mouth daily as needed for allergies.     Magnesium 250 MG TABS Take 250 mg by mouth every other day.     metoprolol succinate (TOPROL-XL) 25 MG 24 hr tablet Take  1 tablet  Daily  for BP       /TAKE 1 TABLET BY MOUTH DAILY FOR BLOOD PRESSURE 90 tablet 3   omeprazole (PRILOSEC) 20 MG capsule Take 1 capsule (20 mg total) by mouth daily. 30 capsule 11   ondansetron (ZOFRAN) 8 MG tablet Take 1 tablet (8 mg total) by mouth 2 (two) times daily as needed for refractory nausea / vomiting. Start on day 3 after carboplatin chemo. 30 tablet 1    PFIZER-BIONT COVID-19 VAC-TRIS SUSP injection      prochlorperazine (COMPAZINE) 10 MG tablet Take 1 tablet (10 mg total) by mouth every 6 (six) hours as needed for nausea or vomiting. 60 tablet 9   rosuvastatin (CRESTOR) 5 MG tablet Take 1 tablet (5 mg total) by mouth 3 (three) times a week. In the evening for cholesterol. 36 tablet 3   trolamine salicylate (ASPERCREME) 10 % cream Apply 1 application topically as needed for muscle pain (Use if you still feel tingles in feet in the morning after taking Gabapentin).     vitamin C (ASCORBIC ACID) 500 MG tablet Take 500 mg by mouth every evening.      No current facility-administered medications for this visit.   Facility-Administered Medications Ordered in Other Visits  Medication Dose Route Frequency Provider Last Rate Last Admin   CARBOplatin (PARAPLATIN) 240 mg in sodium chloride 0.9 % 250 mL chemo infusion  240 mg Intravenous Once [name], Karim Aiello, MD       gemcitabine (GEMZAR) 836 mg in sodium chloride 0.9 % 250 mL chemo infusion  500 mg/m2 (Treatment Plan Recorded) Intravenous Once [name], Daron Stutz, MD       heparin lock flush 100 unit/mL  500  Units Intracatheter Once PRN [name], Brantley Wiley, MD       sodium chloride flush (NS) 0.9 % injection 10 mL  10 mL Intracatheter PRN Heath Lark, MD        SUMMARY OF ONCOLOGIC HISTORY: Oncology History Overview Note  HRD positive   Breast cancer of upper-inner quadrant of left female breast ([place])  02/26/2016 Initial Diagnosis   Left breast biopsy 11:00 position: invasive ductal carcinoma with DCIS, ER 90%, PR 10%, HER-2 negative, Ki-67 30%, grade 2, 2.2 cm palpable lesion T2 N0 stage II a clinical stage   03/18/2016 Surgery   Left lumpectomy: Invasive ductal carcinoma, grade 2, 6.3 cm, with high-grade DCIS, margins negative, 0/4 lymph nodes negative, ER 90%, via 10%, HER-2 negative ratio 0.97, Ki-67 30%, T3 N0 stage IIB   03/25/2016 Procedure   Genetic testing is negative for pathogenic mutations within any of the  20 Genes on the breast/ovarian cancer panel   04/04/2016 Oncotype testing   Oncotype DX recurrence score 37, 25% 10 year distant risk of recurrence   04/17/2016 - 07/31/2016 Chemotherapy   Adjuvant chemotherapy with dose dense Adriamycin and Cytoxan followed by Abraxane weekly 8 ( discontinued due to neuropathy)   09/01/2016 - 09/26/2016 Radiation Therapy   Adj XRT 1) Left breast: 42.5 Gy in 17 fractions. 2) Left breast boost: 7.5 Gy in 3 fractions.   12/02/2016 -  Anti-estrogen oral therapy   Anastrozole 1 mg daily   09/05/2021 -  Chemotherapy   Patient is on Treatment Plan : OVARIAN RECURRENT 3RD LINE Carboplatin D1 / Gemcitabine D1,8 (4/800) q21d     Ovarian cancer ([place])  04/03/2020 Imaging   US pelvis Complex cystic and solid mass in LEFT adnexa 12.5 cm diameter question cystic ovarian neoplasm; recommend correlation with serum tumor markers and further evaluation by MR imaging with and without contrast.     04/04/2020 Imaging   US venous Doppler No evidence of deep venous thrombosis in the right lower extremity. Left common femoral vein also patent.   04/15/2020 Imaging   MRI pelvis 1. Large complex solid and cystic mass arising from the right adnexa measuring 10.8 by 11.5 by 11.1 cm. This has an aggressive appearance with extensive enhancing mural soft tissue components. Findings are highly suspicious for malignant ovarian neoplasm. 2. Extensive bilateral retroperitoneal and bilateral iliac adenopathy compatible with metastatic disease. 3. Signs of extensive peritoneal carcinomatosis including ascites, enhancement, thickening and nodularity of the peritoneal reflections, omental caking and bulky peritoneal nodularity. 4. Suspected serosal involvement of the dome of bladder with loss of normal fat plane. Cannot rule out mural invasion by tumor.   04/17/2020 Tumor Marker   Patient's tumor was tested for the following markers: CA-125 Results of the tumor marker test revealed 1762.    04/20/2020 Cancer Staging   Staging form: Ovary, Fallopian Tube, and Primary Peritoneal Carcinoma, AJCC 8th Edition - Clinical: FIGO Stage IIIC (cT3, cN1, cM0) - Signed by Heath Lark, MD on 04/20/2020    04/23/2020 Imaging   1. Redemonstrated dominant mixed solid and cystic mass arising from the vicinity of the right ovary measuring at least 11.5 x 10.7 cm, not significantly changed compared to prior MR, and consistent with primary ovarian malignancy.  2. Numerous bulky retroperitoneal, bilateral iliac, and pelvic sidewall lymph nodes. 3. Moderate volume ascites throughout the abdomen and pelvis with subtle thickening and nodularity throughout the peritoneum, and extensive bulky nodular metastatic disease of the omentum. 4. Constellation of findings is consistent with advanced nodal and peritoneal  metastatic disease. 5. There are prominent subcentimeter epicardial lymph nodes, nonspecific although suspicious for nodal metastatic disease. No definite nodal metastatic disease in the chest. Attention on follow-up. 6. There are multiple small subpleural nodules at the right lung base overlying the diaphragm, measuring up to 7 mm. These are generally nonspecific and less favored to represent pulmonary metastatic disease given distribution. Attention on follow-up. 7. Somewhat coarse contour of the liver, suggestive of cirrhosis, although out without overt morphologic stigmata. 8. Aortic Atherosclerosis (ICD10-I70.0).     04/24/2020 Procedure   Successful placement of a right internal jugular approach power injectable Port-A-Cath. The catheter is ready for immediate use.     05/02/2020 Tumor Marker   Patient's tumor was tested for the following markers: CA-125 Results of the tumor marker test revealed 1921   05/03/2020 - 12/12/2020 Chemotherapy   The patient had carboplatin and taxol for chemotherapy treatment.     05/08/2020 Procedure   Successful ultrasound-guided paracentesis yielding 2.6 liters of  peritoneal fluid.   05/16/2020 Procedure   Successful ultrasound-guided paracentesis yielding 3.2 liters of peritoneal fluid   05/25/2020 Procedure   Successful ultrasound-guided paracentesis yielding 3 liters of peritoneal fluid.     06/01/2020 Procedure   Successful ultrasound-guided therapeutic paracentesis yielding 1.7 liters of peritoneal fluid.   06/14/2020 Tumor Marker   Patient's tumor was tested for the following markers: CA-125 Results of the tumor marker test revealed 1309   06/20/2020 Imaging   1. Dominant mixed cystic and solid mass in the central pelvis is stable in the interval. 2. Clear interval decrease in retroperitoneal and pelvic sidewall lymphadenopathy. The bulky omental disease has also clearly decreased in the interval. 3. Interval decrease in ascites. 4. Stable 8 mm subpleural nodule along the diaphragm. 5. Aortic Atherosclerosis (ICD10-I70.0).   07/10/2020 Tumor Marker   Patient's tumor was tested for the following markers: CA-125 Results of the tumor marker test revealed 220   08/03/2020 Tumor Marker   Patient's tumor was tested for the following markers: CA-125 Results of the tumor marker test revealed 53.3.   09/03/2020 Tumor Marker   Patient's tumor was tested for the following markers: CA-125 Results of the tumor marker test revealed 29.7   09/20/2020 Imaging   1. Substantial improvement. The right ovarian cystic and solid mass is half the volume that it measured on 06/20/2020. Similar reduction in the bulk of the omental caking of tumor. Prior ascites is resolved and the retroperitoneal adenopathy is markedly improved. 2. Other imaging findings of potential clinical significance: Stable pleural-based nodularity along the right hemidiaphragm measuring about 1.1 by 0.7 by 0.3 cm. Stable hypodense lesion of the right kidney upper pole, probably a cyst although the configuration of this lesion makes it difficult to obtain accurate density measurements. Left  ovarian cyst, stable. Chondrocalcinosis involving the acetabular labra. Mild lumbar spondylosis and degenerative disc disease. 3. Aortic atherosclerosis.     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 (stage ypT3c), predominantly viable with focal fibrosis and hemosiderin laden macrophages (possible mild treatment effect)  B: Ovary and fallopian tube, right, salpingo-oophorectomy - High grade serous carcinoma involving right ovary and fallopian tube with surface involvement, size up to 8 cm  - Areas of necrosis and hemosiderin laden macrophages suggestive of treatment effect - 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 - See synoptic report and comment  C: Ovary and fallopian tube, left, salpingo-oophorectomy - High grade serous carcinoma involving the left fallopian tube, size up to 0.5 cm - Ovary with no definite involvement by carcinoma identified - Nodular area of endometriosis and peritoneal inclusion cyst also present - See synoptic report and comment   Procedure:    Bilateral salpingo-oophorectomy    Procedure:    Omentectomy    Specimen Integrity of Right Ovary:    Intact with surface involvement by tumor    Specimen Integrity of Left Ovary:    Capsule intact   TUMOR Tumor Site:    Right fallopian tube: favor as primary site; also involves right ovary and left fallopian tube  Histologic Type:    Serous carcinoma  Histologic Grade:    High grade  Tumor Size:    Greatest Dimension (Centimeters): in fallopian tube: 1.0 cm; in ovary: 8.0 cm Ovarian Surface Involvement:    Present    Laterality:    Right  Fallopian Tube Surface Involvement:    Present    Laterality:    Bilateral  Other Tissue / Organ  Involvement:    Right ovary  Other Tissue / Organ Involvement:    Right fallopian tube  Other Tissue / Organ Involvement:    Left fallopian tube  Other Tissue / Organ Involvement:    Omentum  Largest Extrapelvic Peritoneal Focus:    Macroscopic (greater than 2 cm)  Peritoneal / Ascitic Fluid:    Not submitted / unknown  Pleural Fluid:    Not submitted / unknown  Treatment Effect:    No definite or minimal response identified (chemotherapy response score [CRS] 1)   LYMPH NODES Regional Lymph Nodes:    No lymph nodes submitted or found   PATHOLOGIC STAGE CLASSIFICATION (pTNM, AJCC 8th Edition) TNM Descriptors:    y (post-treatment)  Primary Tumor (pT):    pT3c  Regional Lymph Nodes (pN):    pNX   FIGO STAGE FIGO Stage:    IIIC   ADDITIONAL FINDINGS Additional Findings:    Serous tubal intraepithelial carcinoma (STIC)  Additional Findings:    Left ovary with no definite carcinoma identified, left-sided nodule of endometriosis and peritoneal inclusion cyst    10/18/2020 - 10/20/2020 Hospital Admission   She was admitted to [facility] for interval debulking surgery   11/19/2020 Tumor Marker   Patient's tumor was tested for the following markers: CA-125. Results of the tumor marker test revealed 38.3   12/12/2020 Tumor Marker   Patient's tumor was tested for the following markers CA-125. Results of the tumor marker test revealed 42.3.   01/02/2021 Tumor Marker   Patient's tumor was tested for the following markers: CA-125. Results of the tumor marker test revealed 37.3   01/29/2021 Tumor Marker   Patient's tumor was tested for the following markers: CA-125 Results of the tumor marker test revealed 44.3   01/29/2021 Imaging   1. Interval increase in loculated appearing ascites in the low central abdomen and pelvis. 2. There is extensive peritoneal nodularity surrounding this fluid, the nodularity itself not appreciably changed in appearance compared to prior examination. 3.  Additional peritoneal nodularity and/or mesenteric lymph nodes are unchanged. 4. Interval decrease in size of a low-attenuation nodule overlying the right hemidiaphragm, now measuring 1.2 cm, previously 1.8 cm when measured similarly. Findings are consistent with treatment response of a metastatic nodule. No other evidence of intrathoracic metastatic disease. 5. Status post hysterectomy, oophorectomy, and omentectomy.  01/31/2021 - 08/26/2021 Chemotherapy   She is started on Olaparib       03/04/2021 Tumor Marker   Patient's tumor was tested for the following markers: CA-125 Results of the tumor marker test revealed 39.7   04/01/2021 Tumor Marker   Patient's tumor was tested for the following markers: CA-125 Results of the tumor marker test revealed 32.3   05/10/2021 Imaging   1. No significant change in peritoneal carcinomatosis since previous study of 3 months ago, primarily in the pelvis where there is complex fluid and peritoneal nodularity. 2. No evidence of solid visceral organ metastasis. No evidence of bowel or ureteral obstruction. 3.  Aortic Atherosclerosis (ICD10-I70.0).   06/13/2021 Tumor Marker   Patient's tumor was tested for the following markers: CA-125. Results of the tumor marker test revealed 48.3.   07/29/2021 Tumor Marker   Patient's tumor was tested for the following markers: CA-125. Results of the tumor marker test revealed 54.   08/25/2021 Imaging   1. There is extensive peritoneal soft tissue thickening and nodularity, particularly about the low right hemipelvis, which is similar to prior examination. Some small peritoneal nodules throughout the abdomen and pelvis are slightly enlarged, other nodules are unchanged. 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 2. No significant change in a subpleural nodule overlying the right hemidiaphragm. 3. Unchanged, loculated small volume pelvic ascites. 4. Status post hysterectomy,  oophorectomy, and omentectomy. 5. Coronary artery disease.   Aortic Atherosclerosis (ICD10-I70.0).     08/26/2021 Tumor Marker   Patient's tumor was tested for the following markers: CA-125. Results of the tumor marker test revealed 56.   09/04/2021 Tumor Marker   Patient's tumor was tested for the following markers: CA-125. Results of the tumor marker test revealed 55.4.   09/05/2021 -  Chemotherapy   Patient is on Treatment Plan : OVARIAN RECURRENT 3RD LINE Carboplatin D1 / Gemcitabine D1,8 (4/800) q21d     10/07/2021 Tumor Marker   Patient's tumor was tested for the following markers: CA-125. Results of the tumor marker test revealed 51.9.     PHYSICAL EXAMINATION: ECOG PERFORMANCE STATUS: 1 - Symptomatic but completely ambulatory  Vitals:   11/05/21 1153  BP: 126/64  Pulse: 76  Resp: 18  Temp: 97.7 F (36.5 C)  SpO2: 100%   Filed Weights   11/05/21 1153  Weight: 142 lb (64.4 kg)    GENERAL:alert, no distress and comfortable SKIN: skin color, texture, turgor are normal, no rashes or significant lesions EYES: normal, Conjunctiva are pink and non-injected, sclera clear OROPHARYNX:no exudate, no erythema and lips, buccal mucosa, and tongue normal  NECK: supple, thyroid normal size, non-tender, without nodularity LYMPH:  no palpable lymphadenopathy in the cervical, axillary or inguinal LUNGS: clear to auscultation and percussion with normal breathing effort HEART: regular rate & rhythm and no murmurs and no lower extremity edema ABDOMEN:abdomen soft, non-tender and normal bowel sounds Musculoskeletal:no cyanosis of digits and no clubbing  NEURO: alert & oriented x 3 with fluent speech, no focal motor/sensory deficits  LABORATORY DATA:  I have reviewed the data as listed    Component Value Date/Time   NA 142 11/05/2021 1137   NA 141 11/06/2016 1102   K 3.8 11/05/2021 1137   K 3.9 11/06/2016 1102   CL 107 11/05/2021 1137   CO2 25 11/05/2021 1137   CO2 27  11/06/2016 1102   GLUCOSE 124 (H) 11/05/2021 1137   GLUCOSE 130 11/06/2016 1102   BUN 21 11/05/2021 1137  BUN 18.0 11/06/2016 1102   CREATININE 1.25 (H) 11/05/2021 1137   CREATININE 1.42 (H) 02/21/2021 1414   CREATININE 1.3 (H) 11/06/2016 1102   CALCIUM 9.5 11/05/2021 1137   CALCIUM 10.2 11/06/2016 1102   PROT 7.0 11/05/2021 1137   PROT 7.1 11/06/2016 1102   ALBUMIN 3.6 11/05/2021 1137   ALBUMIN 3.7 11/06/2016 1102   AST 39 11/05/2021 1137   AST 52 (H) 11/06/2016 1102   ALT 20 11/05/2021 1137   ALT 58 (H) 11/06/2016 1102   ALKPHOS 115 11/05/2021 1137   ALKPHOS 159 (H) 11/06/2016 1102   BILITOT 0.6 11/05/2021 1137   BILITOT 0.53 11/06/2016 1102   GFRNONAA 43 (L) 11/05/2021 1137   GFRNONAA 34 (L) 02/21/2021 1414   GFRAA 40 (L) 02/21/2021 1414    No results found for: SPEP, UPEP  Lab Results  Component Value Date   WBC 4.1 11/05/2021   NEUTROABS 1.8 11/05/2021   HGB 9.9 (L) 11/05/2021   HCT 30.4 (L) 11/05/2021   MCV 98.4 11/05/2021   PLT 112 (L) 11/05/2021      Chemistry      Component Value Date/Time   NA 142 11/05/2021 1137   NA 141 11/06/2016 1102   K 3.8 11/05/2021 1137   K 3.9 11/06/2016 1102   CL 107 11/05/2021 1137   CO2 25 11/05/2021 1137   CO2 27 11/06/2016 1102   BUN 21 11/05/2021 1137   BUN 18.0 11/06/2016 1102   CREATININE 1.25 (H) 11/05/2021 1137   CREATININE 1.42 (H) 02/21/2021 1414   CREATININE 1.3 (H) 11/06/2016 1102      Component Value Date/Time   CALCIUM 9.5 11/05/2021 1137   CALCIUM 10.2 11/06/2016 1102   ALKPHOS 115 11/05/2021 1137   ALKPHOS 159 (H) 11/06/2016 1102   AST 39 11/05/2021 1137   AST 52 (H) 11/06/2016 1102   ALT 20 11/05/2021 1137   ALT 58 (H) 11/06/2016 1102   BILITOT 0.6 11/05/2021 1137   BILITOT 0.53 11/06/2016 1102

## 2021-11-05 NOTE — Patient Instructions (Addendum)
[place] [facility]  Discharge Instructions: Thank you for choosing [facility] to provide your oncology and hematology care.   If you have a lab appointment with the [facility], please go directly to the [facility] and check in at the registration area.   Wear comfortable clothing and clothing appropriate for easy access to any Portacath or PICC line.   We strive to give you quality time with your provider. You may need to reschedule your appointment if you arrive late (15 or more minutes).  Arriving late affects you and other patients whose appointments are after yours.  Also, if you miss three or more appointments without notifying the office, you may be dismissed from the clinic at the provider's discretion.      For prescription refill requests, have your pharmacy contact our office and allow 72 hours for refills to be completed.    Today you received the following chemotherapy and/or immunotherapy agents: Gemcitabine (Gemzar) and Carboplatin.   To help prevent nausea and vomiting after your treatment, we encourage you to take your nausea medication as directed.  BELOW ARE SYMPTOMS THAT SHOULD BE REPORTED IMMEDIATELY: *FEVER GREATER THAN 100.4 F (38 C) OR HIGHER *CHILLS OR SWEATING *NAUSEA AND VOMITING THAT IS NOT CONTROLLED WITH YOUR NAUSEA MEDICATION *UNUSUAL SHORTNESS OF BREATH *UNUSUAL BRUISING OR BLEEDING *URINARY PROBLEMS (pain or burning when urinating, or frequent urination) *BOWEL PROBLEMS (unusual diarrhea, constipation, pain near the anus) TENDERNESS IN MOUTH AND THROAT WITH OR WITHOUT PRESENCE OF ULCERS (sore throat, sores in mouth, or a toothache) UNUSUAL RASH, SWELLING OR PAIN  UNUSUAL VAGINAL DISCHARGE OR ITCHING   Items with * indicate a potential emergency and should be followed up as soon as possible or go to the Emergency Department if any problems should occur.  Please show the CHEMOTHERAPY ALERT CARD or IMMUNOTHERAPY  ALERT CARD at check-in to the Emergency Department and triage nurse.  Should you have questions after your visit or need to cancel or reschedule your appointment, please contact [facility]  Dept: [phone]  and follow the prompts.  Office hours are 8:00 a.m. to 4:30 p.m. Monday - Friday. Please note that voicemails left after 4:00 p.m. may not be returned until the following business day.  We are closed weekends and major holidays. You have access to a nurse at all times for urgent questions. Please call the main number to the clinic Dept: [phone] and follow the prompts.   For any non-urgent questions, you may also contact your provider using MyChart. We now offer e-Visits for anyone 23 and older to request care online for non-urgent symptoms. For details visit [email or web address].   Also download the MyChart app! Go to the app store, search "MyChart", open the app, select [facility], and log in with your MyChart username and password.  Due to Covid, a mask is required upon entering the hospital/clinic. If you do not have a mask, one will be given to you upon arrival. For doctor visits, patients may have 1 support person aged 32 or older with them. For treatment visits, patients cannot have anyone with them due to current Covid guidelines and our immunocompromised population.   Rehydration, Adult Rehydration is the replacement of body fluids, salts, and minerals (electrolytes) that are lost during dehydration. Dehydration is when there is not enough water or other fluids in the body. This happens when you lose more fluids than you take in. Common causes of dehydration include: Not  drinking enough fluids. This can occur when you are ill or doing activities that require a lot of energy, especially in hot weather. Conditions that cause loss of water or other fluids, such as diarrhea, vomiting, sweating, or urinating a lot. Other illnesses, such as fever or  infection. Certain medicines, such as those that remove excess fluid from the body (diuretics). Symptoms of mild or moderate dehydration may include thirst, dry lips and mouth, and dizziness. Symptoms of severe dehydration may include increased heart rate, confusion, fainting, and not urinating. For severe dehydration, you may need to get fluids through an IV at the hospital. For mild or moderate dehydration, you can usually rehydrate at home by drinking certain fluids as told by your health care provider. What are the risks? Generally, rehydration is safe. However, taking in too much fluid (overhydration) can be a problem. This is rare. Overhydration can cause an electrolyte imbalance, kidney failure, or a decrease in salt (sodium) levels in the body. Supplies needed You will need an oral rehydration solution (ORS) if your health care provider tells you to use one. This is a drink to treat dehydration. It can be found in pharmacies and retail stores. How to rehydrate Fluids Follow instructions from your health care provider for rehydration. The kind of fluid and the amount you should drink depend on your condition. In general, you should choose drinks that you prefer. If told by your health care provider, drink an ORS. Make an ORS by following instructions on the package. Start by drinking small amounts, about  cup (120 mL) every 5-10 minutes. Slowly increase how much you drink until you have taken the amount recommended by your health care provider. Drink enough clear fluids to keep your urine pale yellow. If you were told to drink an ORS, finish it first, then start slowly drinking other clear fluids. Drink fluids such as: Water. This includes sparkling water and flavored water. Drinking only water can lead to having too little sodium in your body (hyponatremia). Follow the advice of your health care provider. Water from ice chips you suck on. Fruit juice with water you add to it  (diluted). Sports drinks. Hot or cold herbal teas. Broth-based soups. Milk or milk products. Food Follow instructions from your health care provider about what to eat while you rehydrate. Your health care provider may recommend that you slowly begin eating regular foods in small amounts. Eat foods that contain a healthy balance of electrolytes, such as bananas, oranges, potatoes, tomatoes, and spinach. Avoid foods that are greasy or contain a lot of sugar. In some cases, you may get nutrition through a feeding tube that is passed through your nose and into your stomach (nasogastric tube, or NG tube). This may be done if you have uncontrolled vomiting or diarrhea. Beverages to avoid Certain beverages may make dehydration worse. While you rehydrate, avoid drinking alcohol. How to tell if you are recovering from dehydration You may be recovering from dehydration if: You are urinating more often than before you started rehydrating. Your urine is pale yellow. Your energy level improves. You vomit less frequently. You have diarrhea less frequently. Your appetite improves or returns to normal. You feel less dizzy or less light-headed. Your skin tone and color start to look more normal. Follow these instructions at home: Take over-the-counter and prescription medicines only as told by your health care provider. Do not take sodium tablets. Doing this can lead to having too much sodium in your body (hypernatremia). Contact a health  care provider if: You continue to have symptoms of mild or moderate dehydration, such as: Thirst. Dry lips. Slightly dry mouth. Dizziness. Dark urine or less urine than normal. Muscle cramps. You continue to vomit or have diarrhea. Get help right away if you: Have symptoms of dehydration that get worse. Have a fever. Have a severe headache. Have been vomiting and the following happens: Your vomiting gets worse or does not go away. Your vomit includes blood or  green matter (bile). You cannot eat or drink without vomiting. Have problems with urination or bowel movements, such as: Diarrhea that gets worse or does not go away. Blood in your stool (feces). This may cause stool to look black and tarry. Not urinating, or urinating only a small amount of very dark urine, within 6-8 hours. Have trouble breathing. Have symptoms that get worse with treatment. These symptoms may represent a serious problem that is an emergency. Do not wait to see if the symptoms will go away. Get medical help right away. Call your local emergency services (911 in the [place] Do not drive yourself to the hospital. Summary Rehydration is the replacement of body fluids and minerals (electrolytes) that are lost during dehydration. Follow instructions from your health care provider for rehydration. The kind of fluid and amount you should drink depend on your condition. Slowly increase how much you drink until you have taken the amount recommended by your health care provider. Contact your health care provider if you continue to show signs of mild or moderate dehydration. This information is not intended to replace advice given to you by your health care provider. Make sure you discuss any questions you have with your health care provider. Document Revised: 01/18/2020 Document Reviewed: 11/28/2019 Elsevier Patient Education  2022 [employer].

## 2021-11-05 NOTE — Assessment & Plan Note (Signed)
She has stable chronic kidney disease stage III Observe closely Will adjust the dose of carboplatin accordingly

## 2021-11-06 ENCOUNTER — Telehealth: Payer: Self-pay

## 2021-11-06 ENCOUNTER — Other Ambulatory Visit: Payer: Self-pay | Admitting: Hematology and Oncology

## 2021-11-06 LAB — CA 125: Cancer Antigen (CA) 125: 45.6 U/mL — ABNORMAL HIGH (ref 0.0–38.1)

## 2021-11-06 NOTE — Telephone Encounter (Signed)
Called and given below message. Her husband verbalized understanding.

## 2021-11-06 NOTE — Telephone Encounter (Signed)
-----   Message from Heath Lark, MD sent at 11/06/2021  9:33 AM EST ----- Regarding: CA-125 Pls let her know results are better

## 2021-11-07 ENCOUNTER — Inpatient Hospital Stay (HOSPITAL_COMMUNITY)
Admission: EM | Admit: 2021-11-07 | Discharge: 2021-11-10 | DRG: 522 | Disposition: A | Discharge status: Home Health | Payer: PPO | Attending: Family Medicine | Admitting: Family Medicine

## 2021-11-07 ENCOUNTER — Encounter (HOSPITAL_COMMUNITY): Payer: Self-pay

## 2021-11-07 ENCOUNTER — Emergency Department (HOSPITAL_COMMUNITY): Payer: PPO

## 2021-11-07 ENCOUNTER — Other Ambulatory Visit: Payer: Self-pay

## 2021-11-07 DIAGNOSIS — Z801 Family history of malignant neoplasm of trachea, bronchus and lung: Secondary | ICD-10-CM | POA: Diagnosis not present

## 2021-11-07 DIAGNOSIS — M25551 Pain in right hip: Secondary | ICD-10-CM | POA: Diagnosis not present

## 2021-11-07 DIAGNOSIS — E1129 Type 2 diabetes mellitus with other diabetic kidney complication: Secondary | ICD-10-CM | POA: Diagnosis present

## 2021-11-07 DIAGNOSIS — W010XXA Fall on same level from slipping, tripping and stumbling without subsequent striking against object, initial encounter: Secondary | ICD-10-CM | POA: Diagnosis present

## 2021-11-07 DIAGNOSIS — Z853 Personal history of malignant neoplasm of breast: Secondary | ICD-10-CM

## 2021-11-07 DIAGNOSIS — Z79899 Other long term (current) drug therapy: Secondary | ICD-10-CM | POA: Diagnosis not present

## 2021-11-07 DIAGNOSIS — T451X5A Adverse effect of antineoplastic and immunosuppressive drugs, initial encounter: Secondary | ICD-10-CM | POA: Diagnosis present

## 2021-11-07 DIAGNOSIS — M109 Gout, unspecified: Secondary | ICD-10-CM | POA: Diagnosis present

## 2021-11-07 DIAGNOSIS — S72001A Fracture of unspecified part of neck of right femur, initial encounter for closed fracture: Secondary | ICD-10-CM | POA: Diagnosis present

## 2021-11-07 DIAGNOSIS — N183 Chronic kidney disease, stage 3 unspecified: Secondary | ICD-10-CM | POA: Diagnosis present

## 2021-11-07 DIAGNOSIS — C569 Malignant neoplasm of unspecified ovary: Secondary | ICD-10-CM | POA: Diagnosis not present

## 2021-11-07 DIAGNOSIS — Z96641 Presence of right artificial hip joint: Secondary | ICD-10-CM | POA: Diagnosis not present

## 2021-11-07 DIAGNOSIS — Z923 Personal history of irradiation: Secondary | ICD-10-CM

## 2021-11-07 DIAGNOSIS — Z471 Aftercare following joint replacement surgery: Secondary | ICD-10-CM | POA: Diagnosis not present

## 2021-11-07 DIAGNOSIS — R531 Weakness: Secondary | ICD-10-CM | POA: Diagnosis not present

## 2021-11-07 DIAGNOSIS — I129 Hypertensive chronic kidney disease with stage 1 through stage 4 chronic kidney disease, or unspecified chronic kidney disease: Secondary | ICD-10-CM | POA: Diagnosis not present

## 2021-11-07 DIAGNOSIS — E119 Type 2 diabetes mellitus without complications: Secondary | ICD-10-CM | POA: Diagnosis present

## 2021-11-07 DIAGNOSIS — M25572 Pain in left ankle and joints of left foot: Secondary | ICD-10-CM | POA: Diagnosis not present

## 2021-11-07 DIAGNOSIS — E559 Vitamin D deficiency, unspecified: Secondary | ICD-10-CM | POA: Diagnosis present

## 2021-11-07 DIAGNOSIS — D61818 Other pancytopenia: Secondary | ICD-10-CM | POA: Diagnosis not present

## 2021-11-07 DIAGNOSIS — Z803 Family history of malignant neoplasm of breast: Secondary | ICD-10-CM | POA: Diagnosis not present

## 2021-11-07 DIAGNOSIS — Z20822 Contact with and (suspected) exposure to covid-19: Secondary | ICD-10-CM | POA: Diagnosis not present

## 2021-11-07 DIAGNOSIS — Z881 Allergy status to other antibiotic agents status: Secondary | ICD-10-CM

## 2021-11-07 DIAGNOSIS — G620 Drug-induced polyneuropathy: Secondary | ICD-10-CM | POA: Diagnosis not present

## 2021-11-07 DIAGNOSIS — Z8249 Family history of ischemic heart disease and other diseases of the circulatory system: Secondary | ICD-10-CM | POA: Diagnosis not present

## 2021-11-07 DIAGNOSIS — E785 Hyperlipidemia, unspecified: Secondary | ICD-10-CM | POA: Diagnosis present

## 2021-11-07 DIAGNOSIS — I10 Essential (primary) hypertension: Secondary | ICD-10-CM | POA: Diagnosis not present

## 2021-11-07 DIAGNOSIS — Y9301 Activity, walking, marching and hiking: Secondary | ICD-10-CM | POA: Diagnosis present

## 2021-11-07 DIAGNOSIS — Z886 Allergy status to analgesic agent status: Secondary | ICD-10-CM

## 2021-11-07 DIAGNOSIS — N1832 Chronic kidney disease, stage 3b: Secondary | ICD-10-CM | POA: Diagnosis present

## 2021-11-07 DIAGNOSIS — E1122 Type 2 diabetes mellitus with diabetic chronic kidney disease: Secondary | ICD-10-CM | POA: Diagnosis not present

## 2021-11-07 DIAGNOSIS — Z833 Family history of diabetes mellitus: Secondary | ICD-10-CM

## 2021-11-07 DIAGNOSIS — Z800 Family history of malignant neoplasm of digestive organs: Secondary | ICD-10-CM | POA: Diagnosis not present

## 2021-11-07 DIAGNOSIS — K219 Gastro-esophageal reflux disease without esophagitis: Secondary | ICD-10-CM | POA: Diagnosis present

## 2021-11-07 DIAGNOSIS — S72011A Unspecified intracapsular fracture of right femur, initial encounter for closed fracture: Principal | ICD-10-CM | POA: Diagnosis present

## 2021-11-07 DIAGNOSIS — Z7984 Long term (current) use of oral hypoglycemic drugs: Secondary | ICD-10-CM

## 2021-11-07 DIAGNOSIS — D630 Anemia in neoplastic disease: Secondary | ICD-10-CM | POA: Diagnosis not present

## 2021-11-07 DIAGNOSIS — R0902 Hypoxemia: Secondary | ICD-10-CM | POA: Diagnosis not present

## 2021-11-07 DIAGNOSIS — N179 Acute kidney failure, unspecified: Secondary | ICD-10-CM | POA: Diagnosis not present

## 2021-11-07 DIAGNOSIS — Z820 Family history of epilepsy and other diseases of the nervous system: Secondary | ICD-10-CM

## 2021-11-07 DIAGNOSIS — Z9889 Other specified postprocedural states: Secondary | ICD-10-CM

## 2021-11-07 DIAGNOSIS — J811 Chronic pulmonary edema: Secondary | ICD-10-CM | POA: Diagnosis not present

## 2021-11-07 DIAGNOSIS — Z823 Family history of stroke: Secondary | ICD-10-CM | POA: Diagnosis not present

## 2021-11-07 DIAGNOSIS — Z888 Allergy status to other drugs, medicaments and biological substances status: Secondary | ICD-10-CM

## 2021-11-07 DIAGNOSIS — S79911A Unspecified injury of right hip, initial encounter: Secondary | ICD-10-CM | POA: Diagnosis not present

## 2021-11-07 DIAGNOSIS — S7291XA Unspecified fracture of right femur, initial encounter for closed fracture: Secondary | ICD-10-CM | POA: Diagnosis not present

## 2021-11-07 HISTORY — DX: Chronic kidney disease, unspecified: N18.9

## 2021-11-07 HISTORY — DX: Fracture of unspecified part of neck of right femur, initial encounter for closed fracture: S72.001A

## 2021-11-07 HISTORY — DX: Type 2 diabetes mellitus without complications: E11.9

## 2021-11-07 LAB — CBC WITH DIFFERENTIAL/PLATELET
Abs Immature Granulocytes: 0.02 10*3/uL (ref 0.00–0.07)
Basophils Absolute: 0 10*3/uL (ref 0.0–0.1)
Basophils Relative: 0 %
Eosinophils Absolute: 0 10*3/uL (ref 0.0–0.5)
Eosinophils Relative: 0 %
HCT: 29.4 % — ABNORMAL LOW (ref 36.0–46.0)
Hemoglobin: 9.7 g/dL — ABNORMAL LOW (ref 12.0–15.0)
Immature Granulocytes: 0 %
Lymphocytes Relative: 17 %
Lymphs Abs: 0.9 10*3/uL (ref 0.7–4.0)
MCH: 32.9 pg (ref 26.0–34.0)
MCHC: 33 g/dL (ref 30.0–36.0)
MCV: 99.7 fL (ref 80.0–100.0)
Monocytes Absolute: 0.3 10*3/uL (ref 0.1–1.0)
Monocytes Relative: 5 %
Neutro Abs: 4.4 10*3/uL (ref 1.7–7.7)
Neutrophils Relative %: 78 %
Platelets: 90 10*3/uL — ABNORMAL LOW (ref 150–400)
RBC: 2.95 MIL/uL — ABNORMAL LOW (ref 3.87–5.11)
RDW: 18.6 % — ABNORMAL HIGH (ref 11.5–15.5)
WBC: 5.6 10*3/uL (ref 4.0–10.5)
nRBC: 0 % (ref 0.0–0.2)

## 2021-11-07 LAB — TYPE AND SCREEN
ABO/RH(D): A POS
Antibody Screen: NEGATIVE

## 2021-11-07 LAB — BASIC METABOLIC PANEL
Anion gap: 6 (ref 5–15)
BUN: 44 mg/dL — ABNORMAL HIGH (ref 8–23)
CO2: 27 mmol/L (ref 22–32)
Calcium: 8.8 mg/dL — ABNORMAL LOW (ref 8.9–10.3)
Chloride: 106 mmol/L (ref 98–111)
Creatinine, Ser: 1.48 mg/dL — ABNORMAL HIGH (ref 0.44–1.00)
GFR, Estimated: 35 mL/min — ABNORMAL LOW (ref >60)
Glucose, Bld: 103 mg/dL — ABNORMAL HIGH (ref 70–99)
Potassium: 3.8 mmol/L (ref 3.5–5.1)
Sodium: 139 mmol/L (ref 135–145)

## 2021-11-07 LAB — RESP PANEL BY RT-PCR (FLU A&B, COVID) ARPGX2
Influenza A by PCR: NEGATIVE
Influenza B by PCR: NEGATIVE
SARS Coronavirus 2 by RT PCR: NEGATIVE

## 2021-11-07 LAB — PROTIME-INR
INR: 1.1 (ref 0.8–1.2)
Prothrombin Time: 14 seconds (ref 11.4–15.2)

## 2021-11-07 MED ORDER — SODIUM CHLORIDE 0.9 % IV SOLN: INTRAVENOUS | Status: DC

## 2021-11-07 MED ORDER — MORPHINE SULFATE (PF) 2 MG/ML IV SOLN
0.5000 mg | INTRAVENOUS | Status: DC | PRN
  Administered 2021-11-08 (×5): 0.5 mg via INTRAVENOUS
  Filled 2021-11-07 (×5): qty 1

## 2021-11-07 MED ORDER — HYDROCODONE-ACETAMINOPHEN 5-325 MG PO TABS
1.0000 | ORAL_TABLET | Freq: Four times a day (QID) | ORAL | Status: DC | PRN
  Administered 2021-11-07 – 2021-11-09 (×2): 2 via ORAL
  Administered 2021-11-09 (×2): 1 via ORAL
  Filled 2021-11-07: qty 1
  Filled 2021-11-07 (×3): qty 2
  Filled 2021-11-07: qty 1

## 2021-11-07 NOTE — ED Triage Notes (Signed)
Patient BIB [facility] from home. Had a fall from trying to get up on the sofa. Injured her right hip. No LOC. Patient undergoing ovarian cancer for a year now and on chemo.    EMS 22G right hand, restricted left hand 187mg fentanyl

## 2021-11-07 NOTE — Progress Notes (Signed)
Patient with right femoral neck fracture.  Will need hemiarthroplasty.  Actively on chemotherapy.  Will need medical optimization and then surgery.  Likely over the weekend unless more OR time becomes available tomorrow.    Full consult to follow.   Johnny Bridge, MD

## 2021-11-07 NOTE — ED Provider Notes (Signed)
[facility] DEPT Provider Note   CSN: [id number] Arrival date & time: 11/07/21  2112     History Chief Complaint  Patient presents with   Blase Mess Convey is a 85 y.o. female.   Fall Associated symptoms include abdominal pain. Pertinent negatives include no shortness of breath. Patient presents after fall.  She is being treated for ovarian cancer.  Twisted wrong while she was walking lost her balance and fell.  Complaining of right hip pain.  States she had difficulty moving the leg but now it is straight again.  Did not hit her head.  Not on blood thinners.  Feeling somewhat better after pain medicine she is got but still hurting.     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[facility]) 02/29/2016   skin- 2016- squamous- on right upper arm   GERD (gastroesophageal reflux disease)    Gout    takes Allopurinol daily   History of chemotherapy    History of colon polyps    benign   History of shingles    Hx of radiation therapy    Hyperlipidemia    Hypertension    Ovarian ca ([facility]) dx'd 03/2020   Personal history of chemotherapy    2017   Personal history of radiation therapy    2017   Vitamin D deficiency     Patient Active Problem List   Diagnosis Date Noted   Poor dentition 07/26/2021   Nodule of lower lobe of right lung 06/27/2021   Aortic atherosclerosis ([facility]) by Abd CT scan on 05/10/2021 06/10/2021   Elevated liver enzymes 03/12/2021   Mild nonproliferative diabetic retinopathy of both eyes without macular edema associated with type 2 diabetes mellitus ([facility]) 02/20/2021   Osteopenia 02/20/2021   Deficiency anemia 11/02/2020   Genetic testing 09/14/2020   Pancytopenia, acquired ([facility]) 05/15/2020   Cancer associated pain 05/15/2020   Anemia in neoplastic disease 05/03/2020   Goals of care,  counseling/discussion 04/20/2020   Ovarian cancer ([facility]) 04/19/2020   Edema of right lower leg 04/05/2020   CKD stage 3 due to type 2 diabetes mellitus ([facility]) 07/05/2018   Port catheter in place 09/18/2016   Chemotherapy-induced peripheral neuropathy ([facility]) 08/07/2016   Breast cancer of upper-inner quadrant of left female breast ([facility]) 02/29/2016   Gout 12/08/2014   Diabetic neuropathy, painful ([facility]) 08/28/2014   Medication management 08/27/2014   Essential hypertension 11/10/2013   Hyperlipidemia associated with type 2 diabetes mellitus ([facility]) 11/10/2013   T2_NIDDM w/CKD3 ([facility]) 11/10/2013   Vitamin D deficiency 11/10/2013    Past Surgical History:  Procedure Laterality Date   ABDOMINAL HYSTERECTOMY  1973   BREAST BIOPSY Left 02/26/2016   BREAST LUMPECTOMY Left 03/18/2016   CARDIAC CATHETERIZATION     patient denies this procedure   CATARACT EXTRACTION, BILATERAL  2014   right eye 2/14; left eye 3/3   COLONOSCOPY     HAND SURGERY Left 2012   INCONTINENCE SURGERY  2006   IR IMAGING GUIDED PORT INSERTION  04/24/2020   IR PARACENTESIS  05/08/2020   IR PARACENTESIS  05/16/2020   IR PARACENTESIS  05/25/2020   KNEE ARTHROSCOPY Right    MASTOPEXY Right 06/16/2017   Procedure: RIGHT BREAST MASTOPEXY;  Surgeon: Irene Limbo, MD;  Location: [facility];  Service: Plastics;  Laterality: Right;   PORT-A-CATH REMOVAL N/A 11/27/2016   Procedure: REMOVAL PORT-A-CATH;  Surgeon: Fanny Skates, MD;  Location: [facility];  Service: General;  Laterality: N/A;   PORTACATH PLACEMENT N/A 04/14/2016   Procedure: INSERTION PORT-A-CATH ;  Surgeon: Fanny Skates, MD;  Location: [facility];  Service: General;  Laterality: N/A;   RADIOACTIVE SEED GUIDED PARTIAL MASTECTOMY WITH AXILLARY SENTINEL LYMPH NODE BIOPSY Left 03/18/2016   Procedure: RADIOACTIVE SEED GUIDED LEFT PARTIAL MASTECTOMY WITH AXILLARY SENTINEL LYMPH NODE BIOPSY AND BLUE DYE INJECTION;  Surgeon: Fanny Skates, MD;  Location: [facility];   Service: General;  Laterality: Left;   REDUCTION MAMMAPLASTY Right 2018     OB History     Gravida  0   Para  0   Term  0   Preterm  0   AB  0   Living  0      SAB  0   IAB  0   Ectopic  0   Multiple  0   Live Births  0           Family History  Problem Relation Age of Onset   Stroke Mother 31   Other Mother        history of hysterectomy after last childbirth   Diabetes Father    Alzheimer's disease Father    Hypertension Sister    Lung cancer Sister 32       smoker   Other Sister        history of hysterectomy for fibroids   Breast cancer Sister 48   Esophageal cancer Maternal Aunt 74       not a smoker   Heart attack Maternal Uncle 52   Cirrhosis Sister    Lung cancer Other        nephew dx. 54s; +smoker   Epilepsy Other    Other Other 12       great niece dx. benign ganglioglioma brain tumor; treated at [facility]   Epilepsy Other        no seizures in 2 years   Parkinson's disease Maternal Aunt    Stroke Maternal Grandmother    Heart Problems Maternal Grandmother    Pancreatic cancer Cousin 28       paternal 1st cousin   Colon cancer Neg Hx    Stomach cancer Neg Hx    Colon polyps Neg Hx    Ulcerative colitis Neg Hx     Social History   Tobacco Use   Smoking status: Never   Smokeless tobacco: Never  Vaping Use   Vaping Use: Never used  Substance Use Topics   Alcohol use: No   Drug use: No    Home Medications Prior to Admission medications   Medication Sig Start Date End Date Taking? Authorizing Provider  acetaminophen (TYLENOL) 325 MG tablet Take 325 mg by mouth every 6 (six) hours as needed for headache.    [provider]  allopurinol (ZYLOPRIM) 300 MG tablet TAKE 1 TABLET BY MOUTH DAILY FOR GOUT PREVENTION Patient taking differently: TAKE one-half TABLET BY MOUTH DAILY FOR GOUT PREVENTION, 01/19/21   Liane Comber, NP  blood glucose meter kit and supplies KIT Dispense based on patient and insurance preference. Use to  check blood sugar once daily. Dx: E11.22, N18.30 05/09/20   Liane Comber, NP  Cholecalciferol (VITAMIN D3) 5000 units CAPS Take 5,000 Units by mouth daily.    [provider]  cyanocobalamin 1000 MCG tablet Take 1,000 mcg by mouth daily.  [provider]  ezetimibe (ZETIA) 10 MG tablet Patient knows to take by mouth  !   - Thanks 04/28/21   [name], MD  gabapentin (NEURONTIN) 300 MG capsule Take 1 capsule in the morning and afternoon and 2 capsules at night 12/12/20   [name], Ni, MD  glimepiride (AMARYL) 1 MG tablet Take 1 tab by mouth in the morning if fasting is running 150+. Please do not take this medication if you are ill or not eating as this can cause a low blood sugar. 05/04/20   Liane Comber, NP  glucose blood (FREESTYLE TEST STRIPS) test strip USE TO CHECK BLOOD GLUCOSE ONCE DAILY 07/19/21   Liane Comber, NP  hydrochlorothiazide (HYDRODIURIL) 25 MG tablet TAKE ONE TABLET BY MOUTH DAILY FOR FOR BLOOD PRESSURE AND FLUID RETENTION/ ANKLE SWELLING 05/24/21   Liane Comber, NP  Lancets (FREESTYLE) lancets CHECK BLOOD SUGAR DAILY AS DIRECTED 09/04/21   Magda Bernheim, NP  lidocaine-prilocaine (EMLA) cream Apply to affected area once 04/19/20   Heath Lark, MD  loratadine (CLARITIN) 10 MG tablet Take 10 mg by mouth daily as needed for allergies.    [provider]  Magnesium 250 MG TABS Take 250 mg by mouth every other day.    [provider]  metoprolol succinate (TOPROL-XL) 25 MG 24 hr tablet Take  1 tablet  Daily  for BP       /TAKE 1 TABLET BY MOUTH DAILY FOR BLOOD PRESSURE 08/25/21   [name], MD  omeprazole (PRILOSEC) 20 MG capsule Take 1 capsule (20 mg total) by mouth daily. 12/12/20   Heath Lark, MD  ondansetron (ZOFRAN) 8 MG tablet Take 1 tablet (8 mg total) by mouth 2 (two) times daily as needed for refractory nausea / vomiting. Start on day 3 after carboplatin chemo. 04/19/20   Heath Lark, MD  PFIZER-BIONT COVID-19 VAC-TRIS SUSP  injection  06/11/21   [provider]  prochlorperazine (COMPAZINE) 10 MG tablet Take 1 tablet (10 mg total) by mouth every 6 (six) hours as needed for nausea or vomiting. 05/07/20   Heath Lark, MD  rosuvastatin (CRESTOR) 5 MG tablet Take 1 tablet (5 mg total) by mouth 3 (three) times a week. In the evening for cholesterol. 02/22/21   Liane Comber, NP  trolamine salicylate (ASPERCREME) 10 % cream Apply 1 application topically as needed for muscle pain (Use if you still feel tingles in feet in the morning after taking Gabapentin).    [provider]  vitamin C (ASCORBIC ACID) 500 MG tablet Take 500 mg by mouth every evening.     [provider]    Allergies    Keflex [cephalexin], Meloxicam, Prednisone, Premarin [estrogens conjugated], and Trovan [alatrofloxacin]  Review of Systems   Review of Systems  Constitutional:  Negative for appetite change.  Respiratory:  Negative for shortness of breath.   Gastrointestinal:  Positive for abdominal pain.  Musculoskeletal:        Right hip pain  Skin:  Negative for rash and wound.  Neurological:  Negative for weakness.  Psychiatric/Behavioral:  Negative for confusion.    Physical Exam Updated Vital Signs BP (!) 147/68   Pulse 62   Resp 12   SpO2 92%   Physical Exam Vitals and nursing note reviewed.  Constitutional:      Appearance: Normal appearance.  HENT:     Head: Atraumatic.     Mouth/Throat:     Mouth: Mucous membranes are moist.  Cardiovascular:  Rate and Rhythm: Normal rate.  Pulmonary:     Breath sounds: No wheezing or rhonchi.  Abdominal:     Comments: Some fullness in her mid abdomen.  Nontender.  Musculoskeletal:        General: Tenderness present.     Cervical back: Neck supple.     Comments: Tenderness to right hip.  Decreased range of motion at the hip.  Neurovascular intact over bilateral feet.  No tenderness over the knees.  Pelvis appears stable.  Neurological:     Mental Status: She  is alert.    ED Results / Procedures / Treatments   Labs (all labs ordered are listed, but only abnormal results are displayed) Labs Reviewed  BASIC METABOLIC PANEL - Abnormal; Notable for the following components:      Result Value   Glucose, Bld 103 (*)    BUN 44 (*)    Creatinine, Ser 1.48 (*)    Calcium 8.8 (*)    GFR, Estimated 35 (*)    All other components within normal limits  RESP PANEL BY RT-PCR (FLU A&B, COVID) ARPGX2  PROTIME-INR  CBC WITH DIFFERENTIAL/PLATELET  TYPE AND SCREEN    EKG None  Radiology DG Chest 1 View  Result Date: 11/07/2021 CLINICAL DATA:  Right hip fracture, medical clearance EXAM: CHEST  1 VIEW COMPARISON:  None. FINDINGS: The lungs are symmetrically well expanded. There has developed mild diffuse interstitial infiltrate most in keeping with mild interstitial pulmonary edema. No pneumothorax or pleural effusion. Previously noted right subclavian chest port has been removed and new right internal jugular chest port tip is seen within the right atrium. Cardiac size is within normal limits. Surgical clips are seen within the left breast. No acute bone abnormality. IMPRESSION: Mild interstitial pulmonary edema. Electronically Signed   By: Fidela Salisbury M.D.   On: 11/07/2021 22:03   DG Hip Unilat With Pelvis 2-3 Views Right  Result Date: 11/07/2021 CLINICAL DATA:  Fall, right hip pain EXAM: DG HIP (WITH OR WITHOUT PELVIS) 2-3V RIGHT COMPARISON:  None. FINDINGS: There is an acute, impacted right subcapital femoral neck fracture with external rotation of the femoral shaft. The femoral head is still seated within the right acetabulum. There is right hip joint space narrowing in keeping with at least mild right hip degenerative arthritis. The visualized right hemipelvis is intact. Soft tissues are unremarkable. IMPRESSION: Acute, impacted right subcapital femoral neck fracture. Electronically Signed   By: Fidela Salisbury M.D.   On: 11/07/2021 22:04     Procedures Procedures   Medications Ordered in ED Medications  0.9 %  sodium chloride infusion ( Intravenous New Bag/Given 11/07/21 2208)    ED Course  I have reviewed the triage vital signs and the nursing notes.  Pertinent labs & imaging results that were available during my care of the patient were reviewed by me and considered in my medical decision making (see chart for details).    MDM Rules/Calculators/A&P                           Patient with fall.  Lost balance and fell.  Right hip pain.  Had subcapital right hip fracture.  Has seen Dr. Gladstone Lighter in the past but states she would be happy to see what ever surgeon.  Ended up discussing with Dr. Mardelle Matte who will see patient in consult.  Added to his treatment team.  However patient has had chemotherapy recently.  Has been pancytopenic after  this.  Will need medical admission and medical optimization.  Will end up admitting to hospitalist.  No other apparent injury. Final Clinical Impression(s) / ED Diagnoses Final diagnoses:  Closed fracture of right hip, initial encounter [facility])  Malignant neoplasm of ovary, unspecified laterality [facility])    Rx / DC Orders ED Discharge Orders     None        Davonna Belling, MD 11/07/21 2243

## 2021-11-08 ENCOUNTER — Encounter (HOSPITAL_COMMUNITY): Payer: Self-pay | Admitting: Internal Medicine

## 2021-11-08 ENCOUNTER — Inpatient Hospital Stay (HOSPITAL_COMMUNITY): Payer: PPO | Admitting: Anesthesiology

## 2021-11-08 ENCOUNTER — Encounter (HOSPITAL_COMMUNITY)
Admission: EM | Disposition: A | Discharge status: Home Health | Payer: Self-pay | Source: Home / Self Care | Attending: Family Medicine

## 2021-11-08 ENCOUNTER — Inpatient Hospital Stay (HOSPITAL_COMMUNITY): Payer: PPO

## 2021-11-08 DIAGNOSIS — D61818 Other pancytopenia: Secondary | ICD-10-CM

## 2021-11-08 DIAGNOSIS — S72001A Fracture of unspecified part of neck of right femur, initial encounter for closed fracture: Secondary | ICD-10-CM

## 2021-11-08 DIAGNOSIS — I10 Essential (primary) hypertension: Secondary | ICD-10-CM

## 2021-11-08 DIAGNOSIS — N183 Chronic kidney disease, stage 3 unspecified: Secondary | ICD-10-CM

## 2021-11-08 DIAGNOSIS — E1122 Type 2 diabetes mellitus with diabetic chronic kidney disease: Secondary | ICD-10-CM

## 2021-11-08 DIAGNOSIS — C569 Malignant neoplasm of unspecified ovary: Secondary | ICD-10-CM

## 2021-11-08 HISTORY — PX: HIP ARTHROPLASTY: SHX981

## 2021-11-08 LAB — SURGICAL PCR SCREEN
MRSA, PCR: NEGATIVE
Staphylococcus aureus: POSITIVE — AB

## 2021-11-08 LAB — CBC
HCT: 30.5 % — ABNORMAL LOW (ref 36.0–46.0)
Hemoglobin: 9.8 g/dL — ABNORMAL LOW (ref 12.0–15.0)
MCH: 32.7 pg (ref 26.0–34.0)
MCHC: 32.1 g/dL (ref 30.0–36.0)
MCV: 101.7 fL — ABNORMAL HIGH (ref 80.0–100.0)
Platelets: 68 10*3/uL — ABNORMAL LOW (ref 150–400)
RBC: 3 MIL/uL — ABNORMAL LOW (ref 3.87–5.11)
RDW: 18.6 % — ABNORMAL HIGH (ref 11.5–15.5)
WBC: 4.7 10*3/uL (ref 4.0–10.5)
nRBC: 0 % (ref 0.0–0.2)

## 2021-11-08 LAB — GLUCOSE, CAPILLARY
Glucose-Capillary: 110 mg/dL — ABNORMAL HIGH (ref 70–99)
Glucose-Capillary: 154 mg/dL — ABNORMAL HIGH (ref 70–99)
Glucose-Capillary: 201 mg/dL — ABNORMAL HIGH (ref 70–99)
Glucose-Capillary: 78 mg/dL (ref 70–99)
Glucose-Capillary: 78 mg/dL (ref 70–99)

## 2021-11-08 LAB — BASIC METABOLIC PANEL
Anion gap: 8 (ref 5–15)
BUN: 44 mg/dL — ABNORMAL HIGH (ref 8–23)
CO2: 26 mmol/L (ref 22–32)
Calcium: 9.1 mg/dL (ref 8.9–10.3)
Chloride: 106 mmol/L (ref 98–111)
Creatinine, Ser: 1.4 mg/dL — ABNORMAL HIGH (ref 0.44–1.00)
GFR, Estimated: 37 mL/min — ABNORMAL LOW (ref >60)
Glucose, Bld: 106 mg/dL — ABNORMAL HIGH (ref 70–99)
Potassium: 4 mmol/L (ref 3.5–5.1)
Sodium: 140 mmol/L (ref 135–145)

## 2021-11-08 LAB — PREPARE RBC (CROSSMATCH)

## 2021-11-08 SURGERY — HEMIARTHROPLASTY, HIP, DIRECT ANTERIOR APPROACH, FOR FRACTURE
Anesthesia: General | Site: Hip | Laterality: Right

## 2021-11-08 MED ORDER — CHLORHEXIDINE GLUCONATE CLOTH 2 % EX PADS
6.0000 | MEDICATED_PAD | Freq: Every day | CUTANEOUS | Status: DC
  Administered 2021-11-09 – 2021-11-10 (×2): 6 via TOPICAL

## 2021-11-08 MED ORDER — ENSURE ENLIVE PO LIQD
237.0000 mL | Freq: Two times a day (BID) | ORAL | Status: DC
Start: 2021-11-08 — End: 2021-11-09
  Filled 2021-11-08: qty 237

## 2021-11-08 MED ORDER — ROSUVASTATIN CALCIUM 5 MG PO TABS
5.0000 mg | ORAL_TABLET | ORAL | Status: DC
  Filled 2021-11-08: qty 1

## 2021-11-08 MED ORDER — LIDOCAINE 2% (20 MG/ML) 5 ML SYRINGE
INTRAMUSCULAR | Status: DC | PRN
  Administered 2021-11-08: 100 mg via INTRAVENOUS

## 2021-11-08 MED ORDER — BUPIVACAINE HCL (PF) 0.25 % IJ SOLN
INTRAMUSCULAR | Status: AC
  Filled 2021-11-08: qty 30

## 2021-11-08 MED ORDER — TRANEXAMIC ACID-NACL 1000-0.7 MG/100ML-% IV SOLN
1000.0000 mg | INTRAVENOUS | Status: AC
  Administered 2021-11-08: 1000 mg via INTRAVENOUS

## 2021-11-08 MED ORDER — MUPIROCIN 2 % EX OINT
1.0000 "application " | TOPICAL_OINTMENT | Freq: Two times a day (BID) | CUTANEOUS | Status: DC
  Administered 2021-11-08 – 2021-11-10 (×5): 1 via NASAL
  Filled 2021-11-08 (×2): qty 22

## 2021-11-08 MED ORDER — EPHEDRINE SULFATE-NACL 50-0.9 MG/10ML-% IV SOSY
PREFILLED_SYRINGE | INTRAVENOUS | Status: DC | PRN
  Administered 2021-11-08: 5 mg via INTRAVENOUS
  Administered 2021-11-08: 20 mg via INTRAVENOUS

## 2021-11-08 MED ORDER — ALLOPURINOL 300 MG PO TABS
150.0000 mg | ORAL_TABLET | Freq: Every day | ORAL | Status: DC
  Administered 2021-11-09 – 2021-11-10 (×2): 150 mg via ORAL
  Filled 2021-11-08 (×2): qty 1

## 2021-11-08 MED ORDER — ACETAMINOPHEN 500 MG PO TABS
1000.0000 mg | ORAL_TABLET | Freq: Once | ORAL | Status: AC
  Administered 2021-11-08: 1000 mg via ORAL
  Filled 2021-11-08: qty 2

## 2021-11-08 MED ORDER — LACTATED RINGERS IV SOLN: INTRAVENOUS | Status: DC

## 2021-11-08 MED ORDER — ROCURONIUM BROMIDE 10 MG/ML (PF) SYRINGE
PREFILLED_SYRINGE | INTRAVENOUS | Status: AC
  Filled 2021-11-08: qty 10

## 2021-11-08 MED ORDER — DEXAMETHASONE SODIUM PHOSPHATE 10 MG/ML IJ SOLN
INTRAMUSCULAR | Status: AC
  Filled 2021-11-08: qty 1

## 2021-11-08 MED ORDER — VANCOMYCIN HCL IN DEXTROSE 1-5 GM/200ML-% IV SOLN: 1000.0000 mg | INTRAVENOUS | Status: AC

## 2021-11-08 MED ORDER — FENTANYL CITRATE (PF) 100 MCG/2ML IJ SOLN
25.0000 ug | INTRAMUSCULAR | Status: DC | PRN
  Administered 2021-11-08: 25 ug via INTRAVENOUS

## 2021-11-08 MED ORDER — 0.9 % SODIUM CHLORIDE (POUR BTL) OPTIME
TOPICAL | Status: DC | PRN
  Administered 2021-11-08: 1000 mL

## 2021-11-08 MED ORDER — INSULIN ASPART 100 UNIT/ML IJ SOLN
0.0000 [IU] | INTRAMUSCULAR | Status: DC
  Administered 2021-11-08 – 2021-11-09 (×2): 2 [IU] via SUBCUTANEOUS
  Administered 2021-11-09 (×2): 3 [IU] via SUBCUTANEOUS
  Administered 2021-11-09: 1 [IU] via SUBCUTANEOUS
  Administered 2021-11-09: 2 [IU] via SUBCUTANEOUS

## 2021-11-08 MED ORDER — CHLORHEXIDINE GLUCONATE 4 % EX LIQD
60.0000 mL | Freq: Once | CUTANEOUS | Status: AC
  Administered 2021-11-08: 4 via TOPICAL

## 2021-11-08 MED ORDER — VITAMIN B-12 1000 MCG PO TABS
1000.0000 ug | ORAL_TABLET | Freq: Every day | ORAL | Status: DC
  Administered 2021-11-09 – 2021-11-10 (×2): 1000 ug via ORAL
  Filled 2021-11-08 (×2): qty 1

## 2021-11-08 MED ORDER — PROPOFOL 10 MG/ML IV BOLUS
INTRAVENOUS | Status: DC | PRN
  Administered 2021-11-08: 50 mg via INTRAVENOUS
  Administered 2021-11-08: 80 mg via INTRAVENOUS

## 2021-11-08 MED ORDER — MAGNESIUM OXIDE -MG SUPPLEMENT 400 (240 MG) MG PO TABS
200.0000 mg | ORAL_TABLET | ORAL | Status: DC
  Administered 2021-11-10: 200 mg via ORAL
  Filled 2021-11-08 (×2): qty 1

## 2021-11-08 MED ORDER — SODIUM CHLORIDE 0.9 % IR SOLN
Status: DC | PRN
  Administered 2021-11-08: 1000 mL

## 2021-11-08 MED ORDER — FENTANYL CITRATE (PF) 100 MCG/2ML IJ SOLN
INTRAMUSCULAR | Status: AC
  Filled 2021-11-08: qty 2

## 2021-11-08 MED ORDER — SUGAMMADEX SODIUM 200 MG/2ML IV SOLN
INTRAVENOUS | Status: DC | PRN
  Administered 2021-11-08: 130.6 mg via INTRAVENOUS

## 2021-11-08 MED ORDER — EZETIMIBE 10 MG PO TABS
10.0000 mg | ORAL_TABLET | Freq: Every day | ORAL | Status: DC
  Administered 2021-11-09 – 2021-11-10 (×2): 10 mg via ORAL
  Filled 2021-11-08 (×2): qty 1

## 2021-11-08 MED ORDER — POVIDONE-IODINE 10 % EX SWAB
2.0000 "application " | Freq: Once | CUTANEOUS | Status: AC
  Administered 2021-11-08: 2 via TOPICAL

## 2021-11-08 MED ORDER — DEXAMETHASONE SODIUM PHOSPHATE 10 MG/ML IJ SOLN
INTRAMUSCULAR | Status: DC | PRN
  Administered 2021-11-08: 10 mg via INTRAVENOUS

## 2021-11-08 MED ORDER — CLINDAMYCIN PHOSPHATE 900 MG/50ML IV SOLN
900.0000 mg | Freq: Three times a day (TID) | INTRAVENOUS | Status: AC
  Administered 2021-11-09 (×2): 900 mg via INTRAVENOUS
  Filled 2021-11-08 (×2): qty 50

## 2021-11-08 MED ORDER — VANCOMYCIN HCL IN DEXTROSE 1-5 GM/200ML-% IV SOLN: 1000.0000 mg | Freq: Once | INTRAVENOUS | Status: DC

## 2021-11-08 MED ORDER — FENTANYL CITRATE (PF) 250 MCG/5ML IJ SOLN
INTRAMUSCULAR | Status: AC
  Filled 2021-11-08: qty 5

## 2021-11-08 MED ORDER — ENOXAPARIN SODIUM 40 MG/0.4ML IJ SOSY
40.0000 mg | PREFILLED_SYRINGE | INTRAMUSCULAR | Status: DC
  Administered 2021-11-09 – 2021-11-10 (×2): 40 mg via SUBCUTANEOUS
  Filled 2021-11-08 (×2): qty 0.4

## 2021-11-08 MED ORDER — ADULT MULTIVITAMIN W/MINERALS CH
1.0000 | ORAL_TABLET | Freq: Every day | ORAL | Status: DC
  Administered 2021-11-08 – 2021-11-10 (×3): 1 via ORAL
  Filled 2021-11-08 (×3): qty 1

## 2021-11-08 MED ORDER — METOPROLOL SUCCINATE ER 25 MG PO TB24
25.0000 mg | ORAL_TABLET | Freq: Every day | ORAL | Status: DC
  Administered 2021-11-08 – 2021-11-10 (×3): 25 mg via ORAL
  Filled 2021-11-08 (×3): qty 1

## 2021-11-08 MED ORDER — FENTANYL CITRATE (PF) 250 MCG/5ML IJ SOLN
INTRAMUSCULAR | Status: DC | PRN
  Administered 2021-11-08: 50 ug via INTRAVENOUS
  Administered 2021-11-08 (×2): 100 ug via INTRAVENOUS

## 2021-11-08 MED ORDER — CHLORHEXIDINE GLUCONATE 0.12 % MT SOLN
OROMUCOSAL | Status: AC
  Administered 2021-11-08: 15 mL via OROMUCOSAL
  Filled 2021-11-08: qty 15

## 2021-11-08 MED ORDER — VANCOMYCIN HCL IN DEXTROSE 1-5 GM/200ML-% IV SOLN
INTRAVENOUS | Status: AC
  Administered 2021-11-08: 1000 mg via INTRAVENOUS
  Filled 2021-11-08: qty 200

## 2021-11-08 MED ORDER — GABAPENTIN 300 MG PO CAPS
300.0000 mg | ORAL_CAPSULE | Freq: Every day | ORAL | Status: DC
  Administered 2021-11-09 – 2021-11-10 (×2): 300 mg via ORAL
  Filled 2021-11-08 (×2): qty 1

## 2021-11-08 MED ORDER — PHENYLEPHRINE HCL-NACL 20-0.9 MG/250ML-% IV SOLN
INTRAVENOUS | Status: DC | PRN
  Administered 2021-11-08: 50 ug/min via INTRAVENOUS

## 2021-11-08 MED ORDER — ONDANSETRON HCL 4 MG/2ML IJ SOLN
INTRAMUSCULAR | Status: AC
  Filled 2021-11-08: qty 2

## 2021-11-08 MED ORDER — GABAPENTIN 300 MG PO CAPS
600.0000 mg | ORAL_CAPSULE | Freq: Every day | ORAL | Status: DC
  Administered 2021-11-08 – 2021-11-09 (×2): 600 mg via ORAL
  Filled 2021-11-08 (×2): qty 2

## 2021-11-08 MED ORDER — BUPIVACAINE HCL 0.25 % IJ SOLN
INTRAMUSCULAR | Status: DC | PRN
  Administered 2021-11-08: 30 mL

## 2021-11-08 MED ORDER — ORAL CARE MOUTH RINSE: 15.0000 mL | Freq: Once | OROMUCOSAL | Status: AC

## 2021-11-08 MED ORDER — PROPOFOL 10 MG/ML IV BOLUS
INTRAVENOUS | Status: AC
  Filled 2021-11-08: qty 20

## 2021-11-08 MED ORDER — GABAPENTIN 300 MG PO CAPS: 300.0000 mg | ORAL_CAPSULE | Freq: Three times a day (TID) | ORAL | Status: DC

## 2021-11-08 MED ORDER — SODIUM CHLORIDE 0.9 % IV SOLN: INTRAVENOUS | Status: DC | PRN

## 2021-11-08 MED ORDER — ONDANSETRON HCL 4 MG/2ML IJ SOLN
INTRAMUSCULAR | Status: DC | PRN
  Administered 2021-11-08 (×2): 4 mg via INTRAVENOUS

## 2021-11-08 MED ORDER — LORATADINE 10 MG PO TABS: 10.0000 mg | ORAL_TABLET | Freq: Every day | ORAL | Status: DC | PRN

## 2021-11-08 MED ORDER — CHLORHEXIDINE GLUCONATE 0.12 % MT SOLN: 15.0000 mL | Freq: Once | OROMUCOSAL | Status: AC

## 2021-11-08 MED ORDER — VITAMIN D 25 MCG (1000 UNIT) PO TABS
5000.0000 [IU] | ORAL_TABLET | Freq: Every day | ORAL | Status: DC
  Administered 2021-11-09 – 2021-11-10 (×2): 5000 [IU] via ORAL
  Filled 2021-11-08 (×2): qty 5

## 2021-11-08 MED ORDER — TRANEXAMIC ACID-NACL 1000-0.7 MG/100ML-% IV SOLN
INTRAVENOUS | Status: AC
  Filled 2021-11-08: qty 100

## 2021-11-08 MED ORDER — VITAMIN D 25 MCG (1000 UNIT) PO TABS: 1000.0000 [IU] | ORAL_TABLET | Freq: Every day | ORAL | Status: DC

## 2021-11-08 MED ORDER — LIDOCAINE 2% (20 MG/ML) 5 ML SYRINGE
INTRAMUSCULAR | Status: AC
  Filled 2021-11-08: qty 5

## 2021-11-08 MED ORDER — CLINDAMYCIN PHOSPHATE 900 MG/50ML IV SOLN
900.0000 mg | Freq: Once | INTRAVENOUS | Status: AC
  Administered 2021-11-08: 900 mg via INTRAVENOUS
  Filled 2021-11-08: qty 50

## 2021-11-08 MED ORDER — ROCURONIUM BROMIDE 10 MG/ML (PF) SYRINGE
PREFILLED_SYRINGE | INTRAVENOUS | Status: DC | PRN
  Administered 2021-11-08: 50 mg via INTRAVENOUS

## 2021-11-08 SURGICAL SUPPLY — 68 items
BLADE SAW SAG HD 89.5X25X.94 (BLADE) ×1 IMPLANT
BRUSH FEMORAL CANAL (MISCELLANEOUS) IMPLANT
CEMENT BONE SIMPLEX SPEEDSET (Cement) ×2 IMPLANT
CEMENT RESTRICTOR BONE PREP ST (Miscellaneous) ×1 IMPLANT
CHLORAPREP W/TINT 26 (MISCELLANEOUS) ×4 IMPLANT
COVER SURGICAL LIGHT HANDLE (MISCELLANEOUS) ×2 IMPLANT
DERMABOND ADVANCED (GAUZE/BANDAGES/DRESSINGS) ×1
DERMABOND ADVANCED .7 DNX12 (GAUZE/BANDAGES/DRESSINGS) ×2 IMPLANT
DRAPE HALF SHEET 40X57 (DRAPES) ×4 IMPLANT
DRAPE HIP W/POCKET STRL (MISCELLANEOUS) ×2 IMPLANT
DRAPE IMP U-DRAPE 54X76 (DRAPES) ×2 IMPLANT
DRAPE INCISE IOBAN 66X45 STRL (DRAPES) ×2 IMPLANT
DRAPE INCISE IOBAN 85X60 (DRAPES) ×2 IMPLANT
DRAPE POUCH INSTRU U-SHP 10X18 (DRAPES) ×2 IMPLANT
DRAPE SURG 17X11 SM STRL (DRAPES) ×2 IMPLANT
DRAPE U-SHAPE 47X51 STRL (DRAPES) ×2 IMPLANT
DRESSING AQUACEL AG SP 3.5X10 (GAUZE/BANDAGES/DRESSINGS) IMPLANT
DRSG AQUACEL AG ADV 3.5X 6 (GAUZE/BANDAGES/DRESSINGS) IMPLANT
DRSG AQUACEL AG ADV 3.5X10 (GAUZE/BANDAGES/DRESSINGS) ×1 IMPLANT
DRSG AQUACEL AG SP 3.5X10 (GAUZE/BANDAGES/DRESSINGS)
ELECT BLADE 4.0 EZ CLEAN MEGAD (MISCELLANEOUS) ×2
ELECT REM PT RETURN 15FT ADLT (MISCELLANEOUS) ×2 IMPLANT
ELECTRODE BLDE 4.0 EZ CLN MEGD (MISCELLANEOUS) ×1 IMPLANT
FEMORAL HEAD LFIT V40 28MM PL0 (Orthopedic Implant) ×1 IMPLANT
GLOVE SRG 8 PF TXTR STRL LF DI (GLOVE) ×1 IMPLANT
GLOVE SURG ORTHO LTX SZ8 (GLOVE) ×4 IMPLANT
GLOVE SURG UNDER POLY LF SZ8 (GLOVE) ×2
GOWN STRL REUS W/ TWL XL LVL3 (GOWN DISPOSABLE) ×1 IMPLANT
GOWN STRL REUS W/TWL XL LVL3 (GOWN DISPOSABLE) ×2
HANDPIECE INTERPULSE COAX TIP (DISPOSABLE) ×2
HEAD FEM UNIV BIPOLAR 28X46 (Head) ×1 IMPLANT
HOOD PEEL AWAY FLYTE STAYCOOL (MISCELLANEOUS) ×6 IMPLANT
IRRIGATION SURGIPHOR STRL (IV SOLUTION) IMPLANT
KIT BASIN OR (CUSTOM PROCEDURE TRAY) ×2 IMPLANT
KIT TURNOVER KIT A (KITS) ×2 IMPLANT
MANIFOLD NEPTUNE II (INSTRUMENTS) ×2 IMPLANT
MARKER SKIN DUAL TIP RULER LAB (MISCELLANEOUS) ×2 IMPLANT
NDL 18GX1X1/2 (RX/OR ONLY) (NEEDLE) ×1 IMPLANT
NEEDLE 18GX1X1/2 (RX/OR ONLY) (NEEDLE) ×2 IMPLANT
NS IRRIG 1000ML POUR BTL (IV SOLUTION) ×2 IMPLANT
PACK TOTAL JOINT (CUSTOM PROCEDURE TRAY) ×2 IMPLANT
PRESSURIZER FEMORAL UNIV (MISCELLANEOUS) ×1 IMPLANT
SEALER BIPOLAR AQUA 6.0 (INSTRUMENTS) ×2 IMPLANT
SET HNDPC FAN SPRY TIP SCT (DISPOSABLE) IMPLANT
SET INTERPULSE LAVAGE W/TIP (ORTHOPEDIC DISPOSABLE SUPPLIES) ×2 IMPLANT
SPACER CENTRAL ACCOLADE 2/3 13 (Spacer) ×1 IMPLANT
SPONGE T-LAP 18X18 ~~LOC~~+RFID (SPONGE) ×4 IMPLANT
STAPLER VISISTAT 35W (STAPLE) ×2 IMPLANT
STEM FEM CMT 43X131X35 SZ3 (Stem) ×1 IMPLANT
STOCKINETTE IMPERVIOUS LG (DRAPES) IMPLANT
SUCTION FRAZIER HANDLE 10FR (MISCELLANEOUS) ×2
SUCTION TUBE FRAZIER 10FR DISP (MISCELLANEOUS) ×1 IMPLANT
SUT ETHIBOND 2 V 37 (SUTURE) ×2 IMPLANT
SUT MNCRL AB 3-0 PS2 18 (SUTURE) ×2 IMPLANT
SUT VIC AB 0 CT1 27 (SUTURE) ×4
SUT VIC AB 0 CT1 27XBRD ANBCTR (SUTURE) ×2 IMPLANT
SUT VIC AB 1 CT1 27 (SUTURE) ×6
SUT VIC AB 1 CT1 27XBRD ANBCTR (SUTURE) ×2 IMPLANT
SUT VIC AB 2-0 CT1 27 (SUTURE) ×2
SUT VIC AB 2-0 CT1 TAPERPNT 27 (SUTURE) IMPLANT
SUT VIC AB 2-0 CT2 27 (SUTURE) ×2 IMPLANT
SUT VIC AB CT1 27XBRD ANBCTRL (SUTURE) ×2
SYR 20ML LL LF (SYRINGE) ×1 IMPLANT
TOWEL GREEN STERILE (TOWEL DISPOSABLE) ×2 IMPLANT
TOWER CARTRIDGE SMART MIX (DISPOSABLE) ×1 IMPLANT
TRAY FOLEY MTR SLVR 16FR STAT (SET/KITS/TRAYS/PACK) ×2 IMPLANT
TUBE SUCT ARGYLE STRL (TUBING) ×2 IMPLANT
WATER STERILE IRR 1000ML POUR (IV SOLUTION) ×2 IMPLANT

## 2021-11-08 NOTE — Progress Notes (Signed)
Initial Nutrition Assessment  DOCUMENTATION CODES:   Not applicable  INTERVENTION:  - diet advancement as medically feasible post-op. - will order Ensure Enlive BID, each supplement provides 350 kcal and 20 grams of protein. - will order 1 tablet multivitamin with minerals/day.  - complete NFPE when feasible.    NUTRITION DIAGNOSIS:   Increased nutrient needs related to cancer and cancer related treatments, hip fracture, post-op healing as evidenced by estimated needs.  GOAL:   Patient will meet greater than or equal to 90% of their needs  MONITOR:   Diet advancement, PO intake, Supplement acceptance, Labs, Weight trends, Skin  REASON FOR ASSESSMENT:   Consult Hip fracture protocol  ASSESSMENT:   85 y.o. female with medical history of ovarian cancer on chemo, type 2 DM, HTN, gout, vitamin D deficiency, GERD, arthritis, HLD, CKD, and hx of breast cancer. She presented to the ED from home after a fall with associated R hip pain.  Patient was admitted at [facility]. She was transferred to [facility] for surgery. Able to communicate with RN who had patient at [facility] and she reports that she was informed that patient will stay at [facility] after surgery.   Patient is currently in the OR for R hip hemiarthroplasty. RD was unable to see patient prior to her leaving [place]. She has not been seen by a [place] RD at any time in the past.   Weight today is 144 lb and weight has been stable since 06/11/21. Non-pitting edema to BLE documented in the edema section of flow sheet.     Labs reviewed; CBGs: 78 x2 mg/dl, BUN: 44 mg/dl, creatinine: 1.4 mg/dl, GFR: 37 ml/min.   Medications reviewed; sliding scale novolog, 200 mg mag-ox/day, 1000 mcg oral cyanocobalamin/day.   IVF; D5 @ 75 ml/hr (306 kcal/24 hrs).    NUTRITION - FOCUSED PHYSICAL EXAM:  Patient in OR.   Diet Order:   Diet Order             Diet NPO time specified  Diet effective ____           Diet NPO time specified  Except for: Sips with Meds  Diet effective now                   EDUCATION NEEDS:   No education needs have been identified at this time  Skin:  Skin Assessment: Reviewed RN Assessment  Last BM:  PTA/unknown  Height:   Ht Readings from Last 1 Encounters:  11/08/21 5' 4"  (1.626 m)    Weight:   Wt Readings from Last 1 Encounters:  11/08/21 65.3 kg     Estimated Nutritional Needs:  Kcal:  1900-2100 kcal Protein:  100-115 grams Fluid:  >/= 2 L/day      Jarome Matin, MS, RD, LDN, CNSC Inpatient Clinical Dietitian RD pager # available in [place]  After hours/weekend pager # available in [facility]

## 2021-11-08 NOTE — H&P (View-Only) (Signed)
ORTHOPAEDIC CONSULTATION  REQUESTING PHYSICIAN: Alma Friendly, MD  Chief Complaint: right hip pain  HPI: Kayla Baker is a 85 y.o. female with history of GERD, gout, breast cancer, HTN, ovarian cancer currently on chemotherapy who complains of  right hip pain after a fall. Yesterday she was walking to fix something on her tv and fell directly onto her right side. She noticed severe pain when trying to get up. She was taken to the [facility] ED where she was found to have a right hip fracture. She does not walk with an assistive device at baseline. This morning pain is a 4/10 worse with movement and better with IV pain medication. She has some abdominal pain yesterday but this has resolved overnight. She denies chest pain, shortness of breath, nausea, or vomiting. Denies pain elsewhere.   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[place]) 02/29/2016   skin- 2016- squamous- on right upper arm   GERD (gastroesophageal reflux disease)    Gout    takes Allopurinol daily   History of chemotherapy    History of colon polyps    benign   History of shingles    Hx of radiation therapy    Hyperlipidemia    Hypertension    Ovarian ca ([place]) dx'd 03/2020   Personal history of chemotherapy    2017   Personal history of radiation therapy    2017   Vitamin D deficiency    Past Surgical History:  Procedure Laterality Date   ABDOMINAL HYSTERECTOMY  1973   BREAST BIOPSY Left 02/26/2016   BREAST LUMPECTOMY Left 03/18/2016   CARDIAC CATHETERIZATION     patient denies this procedure   CATARACT EXTRACTION, BILATERAL  2014   right eye 2/14; left eye 3/3   COLONOSCOPY     HAND SURGERY Left 2012   INCONTINENCE SURGERY  2006   IR IMAGING GUIDED PORT INSERTION  04/24/2020   IR PARACENTESIS  05/08/2020   IR PARACENTESIS  05/16/2020   IR PARACENTESIS  05/25/2020   KNEE  ARTHROSCOPY Right    MASTOPEXY Right 06/16/2017   Procedure: RIGHT BREAST MASTOPEXY;  Surgeon: Irene Limbo, MD;  Location: [place];  Service: Plastics;  Laterality: Right;   PORT-A-CATH REMOVAL N/A 11/27/2016   Procedure: REMOVAL PORT-A-CATH;  Surgeon: Fanny Skates, MD;  Location: [facility];  Service: General;  Laterality: N/A;   PORTACATH PLACEMENT N/A 04/14/2016   Procedure: INSERTION PORT-A-CATH ;  Surgeon: Fanny Skates, MD;  Location: [place];  Service: General;  Laterality: N/A;   RADIOACTIVE SEED GUIDED PARTIAL MASTECTOMY WITH AXILLARY SENTINEL LYMPH NODE BIOPSY Left 03/18/2016   Procedure: RADIOACTIVE SEED GUIDED LEFT PARTIAL MASTECTOMY WITH AXILLARY SENTINEL LYMPH NODE BIOPSY AND BLUE DYE INJECTION;  Surgeon: Fanny Skates, MD;  Location: [place];  Service: General;  Laterality: Left;   REDUCTION MAMMAPLASTY Right 2018   Social History   Socioeconomic History   Marital status: Married    Spouse name: [name]   Number of children: 0   Years of education: Not on file   Highest education level: Not on file  Occupational History   Not on file  Tobacco Use   Smoking status: Never   Smokeless tobacco: Never  Vaping Use   Vaping Use: Never used  Substance and Sexual Activity   Alcohol use: No  Drug use: No   Sexual activity: Not on file  Other Topics Concern   Not on file  Social History Narrative   Not on file   Social Determinants of Health   Financial Resource Strain: Not on file  Food Insecurity: Not on file  Transportation Needs: No Transportation Needs   Lack of Transportation (Medical): No   Lack of Transportation (Non-Medical): No  Physical Activity: Not on file  Stress: Not on file  Social Connections: Not on file   Family History  Problem Relation Age of Onset   Stroke Mother 13   Other Mother        history of hysterectomy after last childbirth   Diabetes Father    Alzheimer's disease Father    Hypertension Sister    Lung cancer Sister  35       smoker   Other Sister        history of hysterectomy for fibroids   Breast cancer Sister 36   Esophageal cancer Maternal Aunt 74       not a smoker   Heart attack Maternal Uncle 78   Cirrhosis Sister    Lung cancer Other        nephew dx. 53s; +smoker   Epilepsy Other    Other Other 12       great niece dx. benign ganglioglioma brain tumor; treated at [facility]   Epilepsy Other        no seizures in 2 years   Parkinson's disease Maternal Aunt    Stroke Maternal Grandmother    Heart Problems Maternal Grandmother    Pancreatic cancer Cousin 74       paternal 1st cousin   Colon cancer Neg Hx    Stomach cancer Neg Hx    Colon polyps Neg Hx    Ulcerative colitis Neg Hx    Allergies  Allergen Reactions   Keflex [Cephalexin] Swelling    Tongue and throat swells   Meloxicam Swelling   Prednisone Swelling and Other (See Comments)    Can Not take High doses Cannot tolerate high doses Can Not take High doses   Premarin [Estrogens Conjugated] Hives   Trovan [Alatrofloxacin] Other (See Comments)    Unknown reaction     Positive ROS: All other systems have been reviewed and were otherwise negative with the exception of those mentioned in the HPI and as above.  Physical Exam: General: Alert, no acute distress, laying comfortably in bed.  Cardiovascular: No pedal edema Respiratory: No cyanosis, no use of accessory musculature GI: No organomegaly, abdomen is soft and non-tender Skin: No lesions in the area of chief complaint Neurologic: Sensation intact distally Psychiatric: Patient is competent for consent with normal mood and affect.  Lymphatic: No axillary or cervical lymphadenopathy  MUSCULOSKELETAL: Right leg shortened and externally rotated. Patient endorses sensation to all aspects of foot bilaterally. Dorsiflexion and plantarflexion intact bilaterally. DP pulse found with Doppler. No Ecchymosis to right lateral hip. Mild TTP to lateral right hip, no TTP to right knee.    Imaging: 3 views of right hip show an acute, impacted right femoral neck fracture  Assessment/Plan: Closed right displaced femoral neck fracture - NWB RLE - risks, benefits, and alternatives of surgical fixation were discussed with the patient and she would like to move forward with surgery. We will plan for a right hip hemiarthroplasty with Dr. Mardelle Matte tomorrow morning. Please keep NPO after midnight tonight.    Addendum: We have found open OR time at [facility] this afternoon. Have spoken to patient who agrees to be transferred to [facility] for right hip hemiarthroplasty with Dr. Zachery Dakins this afternoon. Please keep NPO today in anticipation of surgery.    Ventura Bruns, PA-C    11/08/2021 8:30 AM

## 2021-11-08 NOTE — Interval H&P Note (Signed)
The patient has been re-examined, and the chart reviewed, and there have been no interval changes to the documented history and physical.    The risks, benefits, and alternatives have been discussed at length with patient, and the patient is willing to proceed.  Right leg marked. Consent has been signed.  

## 2021-11-08 NOTE — Plan of Care (Signed)
Plan of care discussed. Husband present.

## 2021-11-08 NOTE — Op Note (Signed)
11/08/2021  5:45 PM  PATIENT:  Kayla Baker   MRN: [id number]  PRE-OPERATIVE DIAGNOSIS: Right displaced femoral neck fracture  POST-OPERATIVE DIAGNOSIS:  same  PROCEDURE:  Procedure(s): ARTHROPLASTY BIPOLAR HIP (HEMIARTHROPLASTY) POSTERIOR  PREOPERATIVE INDICATIONS:  Kayla Baker is an 85 y.o. female who was admitted 11/07/2021 with a diagnosis of Closed displaced fracture of right femoral neck ([place]) and elected for surgical management.  The risks benefits and alternatives were discussed with the patient including but not limited to the risks of nonoperative treatment, versus surgical intervention including infection, bleeding, nerve injury, periprosthetic fracture, the need for revision surgery, dislocation, leg length discrepancy, blood clots, cardiopulmonary complications, morbidity, mortality, among others, and they were willing to proceed.  Predicted outcome is good, although there will be at least a six to nine month expected recovery.   OPERATIVE REPORT     SURGEON:  Charlies Constable, MD    ASSISTANT: Luna Glasgow, PA-C (Present throughout the entire procedure,  necessary for completion of procedure in a timely manner, assisting with retraction, instrumentation, and closure)     ANESTHESIA:  general  ESTIMATED BLOOD LOSS: 833XO    COMPLICATIONS:  None.     COMPONENTS:  Implant Name Type Inv. Item Serial No. Manufacturer Lot No. LRB No. Used Action  CEMENT BONE SIMPLEX SPEEDSET - [id number] Cement CEMENT BONE SIMPLEX SPEEDSET  [employer] [id number] Right 2 Implanted  FEMORAL HEAD LFIT V40 28MM PL0 - [id number] Orthopedic Implant FEMORAL HEAD LFIT V40 28MM PL0  [employer] [id number] Right 1 Implanted  STEM FEM CMT [id number] SZ3 - [id number] Stem STEM FEM CMT [id number] SZ3  [employer] [id number] Right 1 Implanted      PROCEDURE IN DETAIL: The patient was met in the holding area and identified.  The appropriate hip  was marked at the operative site. The patient  was then transported to the OR and  placed under anesthesia.  At that point, the patient was  placed in the lateral decubitus position with the operative side up and  secured to the operating room table and all bony prominences padded. A subaxillary role was placed.    The operative lower extremity was prepped from the iliac crest to the ankle.  Sterile draping was performed.  Time out was performed prior to incision.      A routine posterolateral approach was utilized via sharp dissection  carried down to the subcutaneous tissue.  Gross bleeders were Bovie  coagulated.  The iliotibial band was identified and incised  along the length of the skin incision.  A Charnley retractor was inserted with care to protect the sciatic nerve.  With the hip internally rotated, the short external rotators  were identified. The piriformis was tagged with #2 Ethibond, and the hip capsule released in a T-type fashion, and posterior sleeve of the capsule was also tagged.  The femoral neck was exposed, and I resected the femoral neck using the appropriate jig. This was performed at approximately a thumb's breadth above the lesser trochanter.    I then exposed the deep acetabulum, cleared out any tissue including the ligamentum teres.    I then prepared the proximal femur using the box cutter, Charnley awl, and then sequentially broached.  A trial utilized, and I reduced the hip, leg lengths were assessed clinically and felt to be equal. The hip was then taken through a full range of motion, the hip was stable at full extension and 90 degrees external rotation without anterior subluxation. The hip was  also stable in the position of sleep, and in neutral abduction up to 90 degrees flexion, and 80 degrees IR. The trial components were then removed.   We then prepared canal for cementation.  The cement restrictor was measured and inserted distally.  The canal was then irrigated with the pulse lavage and 3 L of normal saline.   2 bags of Simplex cement were prepared.  Using the cement gun the cement was inserted distally and the canal was filled.  We then pressurized the canal. The real implant was then inserted matching the patient's native anteversion of approximately 25 degrees.  We then waited for 15 minutes for the cement to be fully set.  Excess cement was removed.  A lap was placed in the acetabulum prior to cementing was also removed and the acetabulum was assessed to make sure there was no cement or bone fragments.  The hip was then reduced and taken through functional range of motion and found to have excellent stability. Leg lengths were restored.  The capsule was then repaired with #5 Ethibond., and the piriformis was repaired to the abductor tendon.  His. Excellent posterior capsular repair was achieved.   I then irrigated the hip copiously again with pulse lavage. The wounds were injected with 30 cc quarter percent Marcaine. The fascia and IT band was repaired with #1 vicryl, followed by 0 vicryl for the fat layer followed by 2-0 Vicryl and running 3-0 Monocryl for the skin, Dermabond was applied and an Aquacel dressing was placed.  The patient was then awakened and returned to PACU in stable and satisfactory condition. There were no complications.  Post op recs: WB: WBAT with posterior hip precautions x6 weeks Abx: Durene Fruits and clinda x23 hours post op (cephalosporin allergy) Imaging: PACU xrays Dressing: Aquacel dressing to be kept intact until follow-up DVT prophylaxis: lovenox starting POD1 x4 weeks Follow up: 2 weeks after surgery for a wound check with Dr. Zachery Dakins at [facility].  Address: [address] [place], [place],  [address]  Office Phone: [phone]   Charlies Constable, MD Orthopedic Surgeon  11/08/2021 5:45 PM

## 2021-11-08 NOTE — Progress Notes (Signed)
   Follow Up Note  Pt admitted earlier this morning.  Please see H&P for full assessment and plan. Briefly 85 year old female with history of ovarian CA on chemotherapy, diabetes, hypertension, lost her balance at home on a level ground and fell on her right hip.  Denies any prior chest pain, dizziness, shortness of breath before fall.  Denies any loss of consciousness or head injury.  Patient immediately reported right hip pain post fall, unable to bear weight.  In the ED, imaging done showed right hip fracture.  Orthopedics consulted, and requested transfer to [facility] for surgery.   Today, patient denies any new complaints.  Just reports right hip pain from recent fall.  Denies any chest pain, abdominal pain, nausea/vomiting, fever/chills, shortness of breath.  General: NAD  Cardiovascular: S1, S2 present Respiratory: CTAB Abdomen: Soft, nontender, nondistended, bowel sounds present Musculoskeletal: No bilateral pedal edema noted, right hip pain Skin: Normal Psychiatry: Normal mood   Present on Admission:  Closed displaced fracture of right femoral neck (HCC)  CKD stage 3 due to type 2 diabetes mellitus (HCC)  Ovarian cancer (HCC)  Essential hypertension  Pancytopenia, acquired ([place])  T2_NIDDM w/CKD3 ([place])   Right femoral neck fracture Management per Orthopedics, plan for surgery on 11/08/2021  AKI on CKD stage 3b Continue gentle IV fluids Daily BMP  Hypertension BP stable Hold HCTZ, continue metoprolol  Normocytic anemia/thrombocytopenia Likely associated with chemotherapy No evidence of bleeding Monitor closely, daily CBC  Ovarian CA Currently on chemo, last received on 12/6 Follows with Dr. [name]

## 2021-11-08 NOTE — Anesthesia Procedure Notes (Signed)
Procedure Name: Intubation Date/Time: 11/08/2021 3:58 PM Performed by: Erick Colace, CRNA Pre-anesthesia Checklist: Patient identified, Emergency Drugs available, Suction available and Patient being monitored Patient Re-evaluated:Patient Re-evaluated prior to induction Oxygen Delivery Method: Circle system utilized Preoxygenation: Pre-oxygenation with 100% oxygen Induction Type: IV induction Ventilation: Mask ventilation without difficulty Laryngoscope Size: Mac and 3 Grade View: Grade I Tube type: Oral Tube size: 7.0 mm Number of attempts: 1 Airway Equipment and Method: Stylet and Oral airway Placement Confirmation: ETT inserted through vocal cords under direct vision, positive ETCO2 and breath sounds checked- equal and bilateral Secured at: 22 cm Tube secured with: Tape Dental Injury: Teeth and Oropharynx as per pre-operative assessment

## 2021-11-08 NOTE — Anesthesia Preprocedure Evaluation (Addendum)
Anesthesia Evaluation  Patient identified by MRN, date of birth, ID band Patient awake    Reviewed: Allergy & Precautions, H&P , NPO status , Patient's Chart, lab work & pertinent test results  Airway Mallampati: II  TM Distance: >3 FB     Dental   Pulmonary neg pulmonary ROS,    breath sounds clear to auscultation       Cardiovascular hypertension, negative cardio ROS   Rhythm:Regular Rate:Normal     Neuro/Psych Seizures -,   Neuromuscular disease negative psych ROS   GI/Hepatic negative GI ROS, Neg liver ROS,   Endo/Other  negative endocrine ROSdiabetes  Renal/GU negative Renal ROS  negative genitourinary   Musculoskeletal negative musculoskeletal ROS (+)   Abdominal   Peds negative pediatric ROS (+)  Hematology negative hematology ROS (+)   Anesthesia Other Findings   Reproductive/Obstetrics negative OB ROS                            Anesthesia Physical Anesthesia Plan  ASA: 3  Anesthesia Plan: General   Post-op Pain Management:    Induction: Intravenous  PONV Risk Score and Plan: 3 and Treatment may vary due to age or medical condition and Ondansetron  Airway Management Planned: Oral ETT  Additional Equipment:   Intra-op Plan:   Post-operative Plan: Extubation in OR  Informed Consent: I have reviewed the patients History and Physical, chart, labs and discussed the procedure including the risks, benefits and alternatives for the proposed anesthesia with the patient or authorized representative who has indicated his/her understanding and acceptance.     Dental advisory given  Plan Discussed with: Anesthesiologist and CRNA  Anesthesia Plan Comments:        Anesthesia Quick Evaluation

## 2021-11-08 NOTE — Progress Notes (Signed)
Pt transferred to [facility] for surgery.  Report given to [facility] and shortstay.  PA Owens Shark called and updated the husband and daughter.

## 2021-11-08 NOTE — Anesthesia Postprocedure Evaluation (Signed)
Anesthesia Post Note  Patient: Kayla Baker  Procedure(s) Performed: ARTHROPLASTY BIPOLAR HIP (HEMIARTHROPLASTY) POSTERIOR (Right: Hip)     Patient location during evaluation: PACU Anesthesia Type: General Level of consciousness: awake and alert Pain management: pain level controlled Vital Signs Assessment: post-procedure vital signs reviewed and stable Respiratory status: spontaneous breathing, nonlabored ventilation, respiratory function stable and patient connected to nasal cannula oxygen Cardiovascular status: blood pressure returned to baseline and stable Postop Assessment: no apparent nausea or vomiting Anesthetic complications: no   No notable events documented.  Last Vitals:  Vitals:   11/08/21 1945 11/08/21 2009  BP: 139/64 124/62  Pulse: 80 80  Resp: 14 17  Temp:  (!) 36.3 C  SpO2: 94%     Last Pain:  Vitals:   11/08/21 2008  TempSrc:   PainSc: 4                  Kham Zuckerman,W. EDMOND

## 2021-11-08 NOTE — H&P (Signed)
History and Physical    Kayla Baker ZOX:[id number] DOB: January 15, 1936 DOA: 11/07/2021  PCP: [name], MD  Patient coming from: Home  I have personally briefly reviewed patient's old medical records in [place]  Chief Complaint: Fall  HPI: Kayla Baker is a 85 y.o. female with medical history significant of ovarian CA on chemotherapy, DM2, HTN.  Pt lost balance at home, fell, R hip pain post fall.  Pain with movement.  Unable to bear wt.  In to ED for symptoms.  No LOC, no head injury.   ED Course: Has R hip fx.   Review of Systems: As per HPI, otherwise all review of systems negative.  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[place]) 02/29/2016   skin- 2016- squamous- on right upper arm   GERD (gastroesophageal reflux disease)    Gout    takes Allopurinol daily   History of chemotherapy    History of colon polyps    benign   History of shingles    Hx of radiation therapy    Hyperlipidemia    Hypertension    Ovarian ca ([place]) dx'd 03/2020   Personal history of chemotherapy    2017   Personal history of radiation therapy    2017   Vitamin D deficiency     Past Surgical History:  Procedure Laterality Date   ABDOMINAL HYSTERECTOMY  1973   BREAST BIOPSY Left 02/26/2016   BREAST LUMPECTOMY Left 03/18/2016   CARDIAC CATHETERIZATION     patient denies this procedure   CATARACT EXTRACTION, BILATERAL  2014   right eye 2/14; left eye 3/3   COLONOSCOPY     HAND SURGERY Left 2012   INCONTINENCE SURGERY  2006   IR IMAGING GUIDED PORT INSERTION  04/24/2020   IR PARACENTESIS  05/08/2020   IR PARACENTESIS  05/16/2020   IR PARACENTESIS  05/25/2020   KNEE ARTHROSCOPY Right    MASTOPEXY Right 06/16/2017   Procedure: RIGHT BREAST MASTOPEXY;  Surgeon: Irene Limbo, MD;  Location: [place];  Service: Plastics;   Laterality: Right;   PORT-A-CATH REMOVAL N/A 11/27/2016   Procedure: REMOVAL PORT-A-CATH;  Surgeon: Fanny Skates, MD;  Location[facility];  Service: General;  Laterality: N/A;   PORTACATH PLACEMENT N/A 04/14/2016   Procedure: INSERTION PORT-A-CATH ;  Surgeon: Fanny Skates, MD;  Location: [place];  Service: General;  Laterality: N/A;   RADIOACTIVE SEED GUIDED PARTIAL MASTECTOMY WITH AXILLARY SENTINEL LYMPH NODE BIOPSY Left 03/18/2016   Procedure: RADIOACTIVE SEED GUIDED LEFT PARTIAL MASTECTOMY WITH AXILLARY SENTINEL LYMPH NODE BIOPSY AND BLUE DYE INJECTION;  Surgeon: Fanny Skates, MD;  Location: [place];  Service: General;  Laterality: Left;   REDUCTION MAMMAPLASTY Right 2018     reports that she has never smoked. She has never used smokeless tobacco. She reports that she does not drink alcohol and does not use drugs.  Allergies  Allergen Reactions   Keflex [Cephalexin] Swelling    Tongue and throat swells   Meloxicam Swelling   Prednisone Swelling and Other (See Comments)    Can Not take High doses Cannot tolerate high doses Can Not take High doses   Premarin [Estrogens Conjugated] Hives   Trovan [Alatrofloxacin] Other (See Comments)    Unknown reaction    Family History  Problem Relation Age of Onset  Stroke Mother 63   Other Mother        history of hysterectomy after last childbirth   Diabetes Father    Alzheimer's disease Father    Hypertension Sister    Lung cancer Sister 49       smoker   Other Sister        history of hysterectomy for fibroids   Breast cancer Sister 9   Esophageal cancer Maternal Aunt 74       not a smoker   Heart attack Maternal Uncle 76   Cirrhosis Sister    Lung cancer Other        nephew dx. 54s; +smoker   Epilepsy Other    Other Other 12       great niece dx. benign ganglioglioma brain tumor; treated at [facility]   Epilepsy Other        no seizures in 2 years   Parkinson's disease Maternal Aunt    Stroke Maternal Grandmother    Heart  Problems Maternal Grandmother    Pancreatic cancer Cousin 9       paternal 1st cousin   Colon cancer Neg Hx    Stomach cancer Neg Hx    Colon polyps Neg Hx    Ulcerative colitis Neg Hx      Prior to Admission medications   Medication Sig Start Date End Date Taking? Authorizing Provider  acetaminophen (TYLENOL) 325 MG tablet Take 325 mg by mouth every 6 (six) hours as needed for headache.   Yes [provider]  allopurinol (ZYLOPRIM) 300 MG tablet TAKE 1 TABLET BY MOUTH DAILY FOR GOUT PREVENTION Patient taking differently: Take 150 mg by mouth daily. FOR GOUT PREVENTION, 01/19/21  Yes Liane Comber, NP  Cholecalciferol (VITAMIN D3) 5000 units CAPS Take 5,000 Units by mouth daily.   Yes [provider]  cyanocobalamin 1000 MCG tablet Take 1,000 mcg by mouth daily.   Yes [provider]  ezetimibe (ZETIA) 10 MG tablet Patient knows to take by mouth  !   - Thanks Patient taking differently: Take 10 mg by mouth daily. 04/28/21  Yes [name], MD  gabapentin (NEURONTIN) 300 MG capsule Take 1 capsule in the morning and afternoon and 2 capsules at night 12/12/20  Yes Gorsuch, Ni, MD  glimepiride (AMARYL) 1 MG tablet Take 1 tab by mouth in the morning if fasting is running 150+. Please do not take this medication if you are ill or not eating as this can cause a low blood sugar. Patient taking differently: Take 1 mg by mouth See admin instructions. Take 1 tab by mouth daily as needed if fasting is running 150+. Please do not take this medication if you are ill or not eating as this can cause a low blood sugar. 05/04/20  Yes Liane Comber, NP  hydrochlorothiazide (HYDRODIURIL) 25 MG tablet TAKE ONE TABLET BY MOUTH DAILY FOR FOR BLOOD PRESSURE AND FLUID RETENTION/ ANKLE SWELLING Patient taking differently: Take 25 mg by mouth daily. TAKE ONE TABLET BY MOUTH DAILY FOR FOR BLOOD PRESSURE AND FLUID RETENTION/ ANKLE SWELLING 05/24/21  Yes Liane Comber, NP   lidocaine-prilocaine (EMLA) cream Apply to affected area once Patient taking differently: 1 application once as needed (for port access). 04/19/20  Yes Gorsuch, Ni, MD  loratadine (CLARITIN) 10 MG tablet Take 10 mg by mouth daily as needed for allergies.   Yes [provider]  Magnesium 250 MG TABS Take 250 mg by mouth every other day.   Yes [provider]  metoprolol succinate (TOPROL-XL) 25 MG 24 hr tablet Take  1 tablet  Daily  for BP       /TAKE 1 TABLET BY MOUTH DAILY FOR BLOOD PRESSURE Patient taking differently: Take 25 mg by mouth daily. 08/25/21  Yes [name], MD  rosuvastatin (CRESTOR) 5 MG tablet Take 1 tablet (5 mg total) by mouth 3 (three) times a week. In the evening for cholesterol. Patient taking differently: Take 5 mg by mouth every Monday, Wednesday, and Friday. In the evening for cholesterol. 02/22/21  Yes Liane Comber, NP  trolamine salicylate (ASPERCREME) 10 % cream Apply 1 application topically as needed for muscle pain (Use if you still feel tingles in feet in the morning after taking Gabapentin).   Yes [provider]  vitamin C (ASCORBIC ACID) 500 MG tablet Take 500 mg by mouth every evening.    Yes [provider]  blood glucose meter kit and supplies KIT Dispense based on patient and insurance preference. Use to check blood sugar once daily. Dx: E11.22, N18.30 05/09/20   Liane Comber, NP  glucose blood (FREESTYLE TEST STRIPS) test strip USE TO CHECK BLOOD GLUCOSE ONCE DAILY 07/19/21   Liane Comber, NP  Lancets (FREESTYLE) lancets CHECK BLOOD SUGAR DAILY AS DIRECTED 09/04/21   Magda Bernheim, NP  ondansetron (ZOFRAN) 8 MG tablet Take 1 tablet (8 mg total) by mouth 2 (two) times daily as needed for refractory nausea / vomiting. Start on day 3 after carboplatin chemo. Patient not taking: Reported on 11/08/2021 04/19/20   Heath Lark, MD  PFIZER-BIONT COVID-19 VAC-TRIS SUSP injection  06/11/21   [provider]   prochlorperazine (COMPAZINE) 10 MG tablet Take 1 tablet (10 mg total) by mouth every 6 (six) hours as needed for nausea or vomiting. Patient not taking: Reported on 11/08/2021 05/07/20   Heath Lark, MD    Physical Exam: Vitals:   11/07/21 2130 11/07/21 2230 11/07/21 2300 11/08/21 0152  BP: (!) 148/73 (!) 147/68 140/65 (!) 149/73  Pulse: 72 62 65 70  Resp:  12 12 16   Temp:    97.6 F (36.4 C)  TempSrc:    Oral  SpO2: 95% 92% 94% 99%    Constitutional: NAD, calm, comfortable Eyes: PERRL, lids and conjunctivae normal ENMT: Mucous membranes are moist. Posterior pharynx clear of any exudate or lesions.Normal dentition.  Neck: normal, supple, no masses, no thyromegaly Respiratory: clear to auscultation bilaterally, no wheezing, no crackles. Normal respiratory effort. No accessory muscle use.  Cardiovascular: Regular rate and rhythm, no murmurs / rubs / gallops. No extremity edema. 2+ pedal pulses. No carotid bruits.  Abdomen: no tenderness, no masses palpated. No hepatosplenomegaly. Bowel sounds positive.  Musculoskeletal: R hip TTP Skin: no rashes, lesions, ulcers. No induration Neurologic: CN 2-12 grossly intact. Sensation intact, DTR normal. Strength 5/5 in all 4.  Psychiatric: Normal judgment and insight. Alert and oriented x 3. Normal mood.    Labs on Admission: I have personally reviewed following labs and imaging studies  CBC: Recent Labs  Lab 11/05/21 1137 11/07/21 2203  WBC 4.1 5.6  NEUTROABS 1.8 4.4  HGB 9.9* 9.7*  HCT 30.4* 29.4*  MCV 98.4 99.7  PLT 112* 90*   Basic Metabolic Panel: Recent Labs  Lab 11/05/21 1137 11/07/21 2203  NA 142 139  K 3.8 3.8  CL 107 106  CO2 25 27  GLUCOSE 124* 103*  BUN 21 44*  CREATININE 1.25* 1.48*  CALCIUM 9.5 8.8*   GFR: Estimated Creatinine Clearance:  24.9 mL/min (A) (by C-G formula based on SCr of 1.48 mg/dL (H)). Liver Function Tests: Recent Labs  Lab 11/05/21 1137  AST 39  ALT 20  ALKPHOS 115  BILITOT 0.6  PROT  7.0  ALBUMIN 3.6   No results for input(s): LIPASE, AMYLASE in the last 168 hours. No results for input(s): AMMONIA in the last 168 hours. Coagulation Profile: Recent Labs  Lab 11/07/21 2203  INR 1.1   Cardiac Enzymes: No results for input(s): CKTOTAL, CKMB, CKMBINDEX, TROPONINI in the last 168 hours. BNP (last 3 results) No results for input(s): PROBNP in the last 8760 hours. HbA1C: No results for input(s): HGBA1C in the last 72 hours. CBG: No results for input(s): GLUCAP in the last 168 hours. Lipid Profile: No results for input(s): CHOL, HDL, LDLCALC, TRIG, CHOLHDL, LDLDIRECT in the last 72 hours. Thyroid Function Tests: No results for input(s): TSH, T4TOTAL, FREET4, T3FREE, THYROIDAB in the last 72 hours. Anemia Panel: No results for input(s): VITAMINB12, FOLATE, FERRITIN, TIBC, IRON, RETICCTPCT in the last 72 hours. Urine analysis:    Component Value Date/Time   COLORURINE YELLOW 02/21/2021 1414   APPEARANCEUR CLEAR 02/21/2021 1414   LABSPEC 1.009 02/21/2021 1414   PHURINE < OR = 5.0 02/21/2021 1414   GLUCOSEU NEGATIVE 02/21/2021 1414   HGBUR NEGATIVE 02/21/2021 1414   BILIRUBINUR NEGATIVE 01/09/2017 1117   KETONESUR NEGATIVE 02/21/2021 1414   PROTEINUR NEGATIVE 02/21/2021 1414   NITRITE POSITIVE (A) 02/21/2021 1414   LEUKOCYTESUR 2+ (A) 02/21/2021 1414    Radiological Exams on Admission: DG Chest 1 View  Result Date: 11/07/2021 CLINICAL DATA:  Right hip fracture, medical clearance EXAM: CHEST  1 VIEW COMPARISON:  None. FINDINGS: The lungs are symmetrically well expanded. There has developed mild diffuse interstitial infiltrate most in keeping with mild interstitial pulmonary edema. No pneumothorax or pleural effusion. Previously noted right subclavian chest port has been removed and new right internal jugular chest port tip is seen within the right atrium. Cardiac size is within normal limits. Surgical clips are seen within the left breast. No acute bone abnormality.  IMPRESSION: Mild interstitial pulmonary edema. Electronically Signed   By: Fidela Salisbury M.D.   On: 11/07/2021 22:03   DG Hip Unilat With Pelvis 2-3 Views Right  Result Date: 11/07/2021 CLINICAL DATA:  Fall, right hip pain EXAM: DG HIP (WITH OR WITHOUT PELVIS) 2-3V RIGHT COMPARISON:  None. FINDINGS: There is an acute, impacted right subcapital femoral neck fracture with external rotation of the femoral shaft. The femoral head is still seated within the right acetabulum. There is right hip joint space narrowing in keeping with at least mild right hip degenerative arthritis. The visualized right hemipelvis is intact. Soft tissues are unremarkable. IMPRESSION: Acute, impacted right subcapital femoral neck fracture. Electronically Signed   By: Fidela Salisbury M.D.   On: 11/07/2021 22:04    EKG: Independently reviewed.  Assessment/Plan Principal Problem:   Closed displaced fracture of right femoral neck (HCC) Active Problems:   Essential hypertension   T2_NIDDM w/CKD3 ([place])   CKD stage 3 due to type 2 diabetes mellitus (HCC)   Ovarian cancer (HCC)   Pancytopenia, acquired ([place])    L femoral neck fx - Hip fx pathway NPO Pain control per pathway Currently plan is for OR either later today or this weekend depending on availability. GUPTA score = 0.9% Anemia, thrombocytopenia - Due to chemotherapy for ovarian CA, seems to be chronic, values near baseline today Daily CBC Type and screen HTN - Hold HCTZ  Cont metoprolol CKD 3 - Creat and BUN slightly elevated from baseline today Hold HCTZ IVF: LR at 75 Repeat BMP in AM Ovarian cancer - Most recent chemo on 12/6 Follows with Dr. [name] Will put in consult into Epic so she shows up on their list DM2 - Sensitive SSI Q4H while NPO  DVT prophylaxis: SCDs Code Status: Full Family Communication: Husband at bedside Disposition Plan: SNF after hip fx repair Consults called: Ortho Dr. Mardelle Matte Admission status: Admit to  inpatient  Severity of Illness: The appropriate patient status for this patient is INPATIENT. Inpatient status is judged to be reasonable and necessary in order to provide the required intensity of service to ensure the patient's safety. The patient's presenting symptoms, physical exam findings, and initial radiographic and laboratory data in the context of their chronic comorbidities is felt to place them at high risk for further clinical deterioration. Furthermore, it is not anticipated that the patient will be medically stable for discharge from the hospital within 2 midnights of admission.   IP status for surgical repair of hip fx  * I certify that at the point of admission it is my clinical judgment that the patient will require inpatient hospital care spanning beyond 2 midnights from the point of admission due to high intensity of service, high risk for further deterioration and high frequency of surveillance required.*   Artyom Stencel M. DO [facility]  How to contact the [facility] Attending or Consulting provider [place] or covering provider during after hours [place], for this patient?  Check the care team in [facility] and look for a) attending/consulting [facility] provider listed and b) the [facility] team listed Log into [email or web address]  Amion Physician Scheduling and messaging for groups and whole hospitals  On call and physician scheduling software for group practices, residents, hospitalists and other medical providers for call, clinic, rotation and shift schedules. OnCall Enterprise is a hospital-wide system for scheduling doctors and paging doctors on call. EasyPlot is for scientific plotting and data analysis.  [email or web address]  and use [place]'s universal password to access. If you do not have the password, please contact the hospital operator.  Locate the [facility] provider you are looking for under [facility] and page to a number that you can be directly reached. If you still have difficulty  reaching the provider, please page the [facility] (Director on Call) for the Hospitalists listed on amion for assistance.  11/08/2021, 2:10 AM

## 2021-11-08 NOTE — Consult Note (Addendum)
ORTHOPAEDIC CONSULTATION  REQUESTING PHYSICIAN: Alma Friendly, MD  Chief Complaint: right hip pain  HPI: Kayla Baker is a 85 y.o. female with history of GERD, gout, breast cancer, HTN, ovarian cancer currently on chemotherapy who complains of  right hip pain after a fall. Yesterday she was walking to fix something on her tv and fell directly onto her right side. She noticed severe pain when trying to get up. She was taken to the [facility] ED where she was found to have a right hip fracture. She does not walk with an assistive device at baseline. This morning pain is a 4/10 worse with movement and better with IV pain medication. She has some abdominal pain yesterday but this has resolved overnight. She denies chest pain, shortness of breath, nausea, or vomiting. Denies pain elsewhere.   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[place]) 02/29/2016   skin- 2016- squamous- on right upper arm   GERD (gastroesophageal reflux disease)    Gout    takes Allopurinol daily   History of chemotherapy    History of colon polyps    benign   History of shingles    Hx of radiation therapy    Hyperlipidemia    Hypertension    Ovarian ca ([place]) dx'd 03/2020   Personal history of chemotherapy    2017   Personal history of radiation therapy    2017   Vitamin D deficiency    Past Surgical History:  Procedure Laterality Date   ABDOMINAL HYSTERECTOMY  1973   BREAST BIOPSY Left 02/26/2016   BREAST LUMPECTOMY Left 03/18/2016   CARDIAC CATHETERIZATION     patient denies this procedure   CATARACT EXTRACTION, BILATERAL  2014   right eye 2/14; left eye 3/3   COLONOSCOPY     HAND SURGERY Left 2012   INCONTINENCE SURGERY  2006   IR IMAGING GUIDED PORT INSERTION  04/24/2020   IR PARACENTESIS  05/08/2020   IR PARACENTESIS  05/16/2020   IR PARACENTESIS  05/25/2020   KNEE  ARTHROSCOPY Right    MASTOPEXY Right 06/16/2017   Procedure: RIGHT BREAST MASTOPEXY;  Surgeon: Irene Limbo, MD;  Location: [place];  Service: Plastics;  Laterality: Right;   PORT-A-CATH REMOVAL N/A 11/27/2016   Procedure: REMOVAL PORT-A-CATH;  Surgeon: Fanny Skates, MD;  [facility];  Service: General;  Laterality: N/A;   PORTACATH PLACEMENT N/A 04/14/2016   Procedure: INSERTION PORT-A-CATH ;  Surgeon: Fanny Skates, MD;  Location: [place];  Service: General;  Laterality: N/A;   RADIOACTIVE SEED GUIDED PARTIAL MASTECTOMY WITH AXILLARY SENTINEL LYMPH NODE BIOPSY Left 03/18/2016   Procedure: RADIOACTIVE SEED GUIDED LEFT PARTIAL MASTECTOMY WITH AXILLARY SENTINEL LYMPH NODE BIOPSY AND BLUE DYE INJECTION;  Surgeon: Fanny Skates, MD;  Location: [place];  Service: General;  Laterality: Left;   REDUCTION MAMMAPLASTY Right 2018   Social History   Socioeconomic History   Marital status: Married    Spouse name: [name]   Number of children: 0   Years of education: Not on file   Highest education level: Not on file  Occupational History   Not on file  Tobacco Use   Smoking status: Never   Smokeless tobacco: Never  Vaping Use   Vaping Use: Never used  Substance and Sexual Activity   Alcohol use: No  Drug use: No   Sexual activity: Not on file  Other Topics Concern   Not on file  Social History Narrative   Not on file   Social Determinants of Health   Financial Resource Strain: Not on file  Food Insecurity: Not on file  Transportation Needs: No Transportation Needs   Lack of Transportation (Medical): No   Lack of Transportation (Non-Medical): No  Physical Activity: Not on file  Stress: Not on file  Social Connections: Not on file   Family History  Problem Relation Age of Onset   Stroke Mother 31   Other Mother        history of hysterectomy after last childbirth   Diabetes Father    Alzheimer's disease Father    Hypertension Sister    Lung cancer Sister  67       smoker   Other Sister        history of hysterectomy for fibroids   Breast cancer Sister 59   Esophageal cancer Maternal Aunt 74       not a smoker   Heart attack Maternal Uncle 62   Cirrhosis Sister    Lung cancer Other        nephew dx. 59s; +smoker   Epilepsy Other    Other Other 12       great niece dx. benign ganglioglioma brain tumor; treated at [facility]   Epilepsy Other        no seizures in 2 years   Parkinson's disease Maternal Aunt    Stroke Maternal Grandmother    Heart Problems Maternal Grandmother    Pancreatic cancer Cousin 36       paternal 1st cousin   Colon cancer Neg Hx    Stomach cancer Neg Hx    Colon polyps Neg Hx    Ulcerative colitis Neg Hx    Allergies  Allergen Reactions   Keflex [Cephalexin] Swelling    Tongue and throat swells   Meloxicam Swelling   Prednisone Swelling and Other (See Comments)    Can Not take High doses Cannot tolerate high doses Can Not take High doses   Premarin [Estrogens Conjugated] Hives   Trovan [Alatrofloxacin] Other (See Comments)    Unknown reaction     Positive ROS: All other systems have been reviewed and were otherwise negative with the exception of those mentioned in the HPI and as above.  Physical Exam: General: Alert, no acute distress, laying comfortably in bed.  Cardiovascular: No pedal edema Respiratory: No cyanosis, no use of accessory musculature GI: No organomegaly, abdomen is soft and non-tender Skin: No lesions in the area of chief complaint Neurologic: Sensation intact distally Psychiatric: Patient is competent for consent with normal mood and affect.  Lymphatic: No axillary or cervical lymphadenopathy  MUSCULOSKELETAL: Right leg shortened and externally rotated. Patient endorses sensation to all aspects of foot bilaterally. Dorsiflexion and plantarflexion intact bilaterally. DP pulse found with Doppler. No Ecchymosis to right lateral hip. Mild TTP to lateral right hip, no TTP to right knee.    Imaging: 3 views of right hip show an acute, impacted right femoral neck fracture  Assessment/Plan: Closed right displaced femoral neck fracture - NWB RLE - risks, benefits, and alternatives of surgical fixation were discussed with the patient and she would like to move forward with surgery. We will plan for a right hip hemiarthroplasty with Dr. Mardelle Matte tomorrow morning. Please keep NPO after midnight tonight.    Addendum: We have found open OR time at [facility] this afternoon. Have spoken to patient who agrees to be transferred to [facility] for right hip hemiarthroplasty with Dr. Zachery Dakins this afternoon. Please keep NPO today in anticipation of surgery.    [place]ent[place] Bruns, PA-C    11/08/2021 8:30 AM

## 2021-11-08 NOTE — Progress Notes (Signed)
Due to her CKD, she will have plenty of pre-op vanc in her body so she will not need post-op vancomycin.  Onnie Boer, PharmD, BCIDP, AAHIVP, CPP Infectious Disease Pharmacist 11/08/2021 8:13 PM

## 2021-11-09 ENCOUNTER — Encounter (HOSPITAL_COMMUNITY)
Admission: EM | Disposition: A | Discharge status: Home Health | Payer: Self-pay | Source: Home / Self Care | Attending: Family Medicine

## 2021-11-09 LAB — CBC WITH DIFFERENTIAL/PLATELET
Abs Immature Granulocytes: 0.03 10*3/uL (ref 0.00–0.07)
Basophils Absolute: 0 10*3/uL (ref 0.0–0.1)
Basophils Relative: 0 %
Eosinophils Absolute: 0 10*3/uL (ref 0.0–0.5)
Eosinophils Relative: 0 %
HCT: 28.1 % — ABNORMAL LOW (ref 36.0–46.0)
Hemoglobin: 9.4 g/dL — ABNORMAL LOW (ref 12.0–15.0)
Immature Granulocytes: 1 %
Lymphocytes Relative: 4 %
Lymphs Abs: 0.2 10*3/uL — ABNORMAL LOW (ref 0.7–4.0)
MCH: 31.2 pg (ref 26.0–34.0)
MCHC: 33.5 g/dL (ref 30.0–36.0)
MCV: 93.4 fL (ref 80.0–100.0)
Monocytes Absolute: 0 10*3/uL — ABNORMAL LOW (ref 0.1–1.0)
Monocytes Relative: 1 %
Neutro Abs: 5.8 10*3/uL (ref 1.7–7.7)
Neutrophils Relative %: 94 %
Platelets: 68 10*3/uL — ABNORMAL LOW (ref 150–400)
RBC: 3.01 MIL/uL — ABNORMAL LOW (ref 3.87–5.11)
RDW: 21.2 % — ABNORMAL HIGH (ref 11.5–15.5)
Smear Review: DECREASED
WBC: 6.1 10*3/uL (ref 4.0–10.5)
nRBC: 0 % (ref 0.0–0.2)

## 2021-11-09 LAB — BPAM PLATELET PHERESIS
Blood Product Expiration Date: 202212112359
ISSUE DATE / TIME: 202212091545
Unit Type and Rh: 6200

## 2021-11-09 LAB — BASIC METABOLIC PANEL
Anion gap: 11 (ref 5–15)
BUN: 35 mg/dL — ABNORMAL HIGH (ref 8–23)
CO2: 23 mmol/L (ref 22–32)
Calcium: 8.2 mg/dL — ABNORMAL LOW (ref 8.9–10.3)
Chloride: 101 mmol/L (ref 98–111)
Creatinine, Ser: 1.43 mg/dL — ABNORMAL HIGH (ref 0.44–1.00)
GFR, Estimated: 36 mL/min — ABNORMAL LOW (ref >60)
Glucose, Bld: 216 mg/dL — ABNORMAL HIGH (ref 70–99)
Potassium: 3.9 mmol/L (ref 3.5–5.1)
Sodium: 135 mmol/L (ref 135–145)

## 2021-11-09 LAB — GLUCOSE, CAPILLARY
Glucose-Capillary: 121 mg/dL — ABNORMAL HIGH (ref 70–99)
Glucose-Capillary: 129 mg/dL — ABNORMAL HIGH (ref 70–99)
Glucose-Capillary: 129 mg/dL — ABNORMAL HIGH (ref 70–99)
Glucose-Capillary: 158 mg/dL — ABNORMAL HIGH (ref 70–99)
Glucose-Capillary: 167 mg/dL — ABNORMAL HIGH (ref 70–99)
Glucose-Capillary: 222 mg/dL — ABNORMAL HIGH (ref 70–99)

## 2021-11-09 LAB — PREPARE PLATELET PHERESIS: Unit division: 0

## 2021-11-09 SURGERY — HEMIARTHROPLASTY, HIP, DIRECT ANTERIOR APPROACH, FOR FRACTURE
Anesthesia: Choice | Site: Hip | Laterality: Right

## 2021-11-09 MED ORDER — INSULIN ASPART 100 UNIT/ML IJ SOLN: 0.0000 [IU] | Freq: Three times a day (TID) | INTRAMUSCULAR | Status: DC

## 2021-11-09 MED ORDER — GLUCERNA SHAKE PO LIQD
237.0000 mL | Freq: Two times a day (BID) | ORAL | Status: DC
  Administered 2021-11-09 (×2): 237 mL via ORAL

## 2021-11-09 NOTE — Evaluation (Signed)
Physical Therapy Evaluation Patient Details Name: Kayla Baker MRN: [id number] DOB: 02-Jul-1936 Today's Date: 11/09/2021  History of Present Illness  Pt is 85 y.o. female admitted on 11/07/21 with Rt hip pain after sustaining a fall.  She was found to have Rt hip fx.  She underwent Rt posterior hemiarthroplasty.  PMH: ovarian CA > currently on chemo, DM, HTN, Gout, HTN, breast CA  Clinical Impression  Pt was previously independent with all ADLs and functional mobility.  Currently she is requiring min A with use of a RW for bed mobility and transfers to chair.  Pt demos dec R LE strength, dec activity tolerance, transfer difficulty, dec balance and would benefit from skilled PT in acute care to address deficits indicated.  Discussed rehab placement, however, pt is concerned about risk of infection due to chemo/CA tx and would prefer to return home.  In order to safely return home, pt will require min A for functional mobility, RW, BSC and will need to practice stair negotiation and inc gait distance.  Demos understanding.       Recommendations for follow up therapy are one component of a multi-disciplinary discharge planning process, led by the attending physician.  Recommendations may be updated based on patient status, additional functional criteria and insurance authorization.  Follow Up Recommendations Home health PT (Pt and spouse preference is home with [facility].  Understands that ideally she will need to demo safety with amb household distances and negotiation of 2 steps to get inside.)    Assistance Recommended at Discharge Intermittent Supervision/Assistance  Functional Status Assessment Patient has had a recent decline in their functional status and demonstrates the ability to make significant improvements in function in a reasonable and predictable amount of time.  Equipment Recommendations  Rolling walker (2 wheels);BSC/3in1 (Offered w/c to assist with transport into home, but spouse states  he would have no one to assist him to get her inside.)    Recommendations for Other Services       Precautions / Restrictions Precautions Precautions: Posterior Hip;Fall Precaution Booklet Issued: No Precaution Comments: hgb, platletts Restrictions Weight Bearing Restrictions: Yes RLE Weight Bearing: Weight bearing as tolerated      Mobility  Bed Mobility Overal bed mobility: Needs Assistance Bed Mobility: Supine to Sit Rolling: Min assist   Supine to sit: Min assist Sit to supine: Max assist   General bed mobility comments: Pt. transfers from sup > sit with min A for R LE negotiation and HH support to sit up.  Educated to maintain UEs behind pelvis to push pelvis to EOB without breaking hip precautions.  Demos good sitting balance.    Transfers Overall transfer level: Needs assistance Equipment used: Rolling walker (2 wheels) Transfers: Sit to/from Stand Sit to Stand: Min assist           General transfer comment: Pt. performs sti > stand with min A, VCs for appropriate hand placement and to stand straight up with trunk upright and not to lean forward.  Takes inc time to complete.  Pt. also educated to reach back for handrails on chair and lean back into sitting, avoiding forward flexion of trunk.  Demos good carryover of education.  Able to take a few steps over to chair with min A, VCs for appropriate sequencing, but declines further gait at this time.    Ambulation/Gait                  Stairs  Wheelchair Mobility    Modified Rankin (Stroke Patients Only)       Balance Overall balance assessment: Needs assistance Sitting-balance support: Bilateral upper extremity supported;Feet supported Sitting balance-Leahy Scale: Fair     Standing balance support: Bilateral upper extremity supported;During functional activity Standing balance-Leahy Scale: Poor                               Pertinent Vitals/Pain Pain Assessment:  0-10 Pain Score: 2  Pain Location: R hip Pain Descriptors / Indicators: Aching;Discomfort Pain Intervention(s): Limited activity within patient's tolerance;Monitored during session;Repositioned    Home Living Family/patient expects to be discharged to:: Private residence Living Arrangements: Spouse/significant other Available Help at Discharge: Family Type of Home: House Home Access: Stairs to enter Entrance Stairs-Rails: Right Entrance Stairs-Number of Steps: 2   Home Layout: One level Home Equipment: None      Prior Function Prior Level of Function : Independent/Modified Independent             Mobility Comments: indep with mobility ADLs Comments: indep with all ADLs/IADL tasks     Hand Dominance   Dominant Hand: Right    Extremity/Trunk Assessment   Upper Extremity Assessment Upper Extremity Assessment: Defer to OT evaluation    Lower Extremity Assessment Lower Extremity Assessment: RLE deficits/detail RLE Deficits / Details: Grossly 2+/5    Cervical / Trunk Assessment Cervical / Trunk Assessment: Normal  Communication   Communication: No difficulties  Cognition Arousal/Alertness: Awake/alert Behavior During Therapy: WFL for tasks assessed/performed Overall Cognitive Status: Within Functional Limits for tasks assessed                                 General Comments: Pt. is supine in bed when PT arrives.  Spouse present in room.  Alert and oriented x 3, agreeable to work with PT.  Wants to get up to chair.        General Comments General comments (skin integrity, edema, etc.): Pt had orthostatic hypotension earlier in day with OT.  Educated to complete 20 ankle pumps in between positional changes to assist.  Demos good tolerance to activity without any reported dizziness during mobility.    Exercises     Assessment/Plan    PT Assessment Patient needs continued PT services  PT Problem List Decreased strength;Decreased  mobility;Decreased range of motion;Decreased knowledge of precautions;Decreased activity tolerance;Decreased balance;Decreased knowledge of use of DME;Pain       PT Treatment Interventions DME instruction;Therapeutic exercise;Gait training;Balance training;Stair training;Functional mobility training;Therapeutic activities;Patient/family education    PT Goals (Current goals can be found in the Care Plan section)  Acute Rehab PT Goals Patient Stated Goal: Pt's goal is to return hom PT Goal Formulation: With patient Time For Goal Achievement: 11/23/21 Potential to Achieve Goals: Good    Frequency Min 5X/week   Barriers to discharge        Co-evaluation               AM-PAC PT "6 Clicks" Mobility  Outcome Measure Help needed turning from your back to your side while in a flat bed without using bedrails?: A Little Help needed moving from lying on your back to sitting on the side of a flat bed without using bedrails?: A Little Help needed moving to and from a bed to a chair (including a wheelchair)?: A Little Help needed standing up from a  chair using your arms (e.g., wheelchair or bedside chair)?: A Little Help needed to walk in hospital room?: A Little Help needed climbing 3-5 steps with a railing? : A Lot 6 Click Score: 17    End of Session Equipment Utilized During Treatment: Gait belt Activity Tolerance: Patient tolerated treatment well Patient left: in chair;with call bell/phone within reach;with chair alarm set;with nursing/sitter in room   PT Visit Diagnosis: Other abnormalities of gait and mobility (R26.89);History of falling (Z91.81)    Time: 0100-0125 PT Time Calculation (min) (ACUTE ONLY): 25 min   Charges:   PT Evaluation $PT Eval Low Complexity: 1 Low PT Treatments $Therapeutic Activity: 8-22 mins        Mackayla Mullins A. Romel Dumond, PT, DPT Acute Rehabilitation Services Office: [place] 11/09/2021, 1:36 PM

## 2021-11-09 NOTE — Progress Notes (Signed)
[facility]  PROGRESS NOTE  Kayla Baker GGY:[id number] DOB: 02-23-1936 DOA: 11/07/2021 PCP: [name], MD   Brief HPI:   85 year old female with a history of ovarian carcinoma on chemotherapy, diabetes mellitus type 2, hypertension lost her balance at home on level ground when she fell on her right hip.  Patient immediately reported right hip pain post fall and was unable to bear weight.  In the ED imaging showed right hip fracture.  Orthopedics was consulted.  Patient was transferred to [facility] for surgery. She underwent right hemiarthroplasty   Subjective   Patient seen and examined, denies any complaints.  Pain well controlled   Assessment/Plan:     Right femoral neck fracture -S/p right hemiarthroplasty -Orthopedics following -Lovenox for DVT prophylaxis for 30 days as per orthopedics   CKD stage IIIb -Creatinine at baseline  Hypertension -BP soft -Continue to hold HCTZ -Patient is on metoprolol  Normocytic anemia -Hemoglobin at baseline  Ovarian carcinoma -Currently on chemotherapy, last chemo on 12/6 -Followed by oncology as outpatient  Diabetes mellitus type 2 -Continue sliding scale insulin with NovoLog -CBG well controlled  Medications     allopurinol  150 mg Oral Daily   Chlorhexidine Gluconate Cloth  6 each Topical Daily   cholecalciferol  5,000 Units Oral Daily   enoxaparin  40 mg Subcutaneous Q24H   ezetimibe  10 mg Oral Daily   feeding supplement (GLUCERNA SHAKE)  237 mL Oral BID BM   gabapentin  300 mg Oral Daily   And   gabapentin  600 mg Oral QHS   insulin aspart  0-9 Units Subcutaneous Q4H   magnesium oxide  200 mg Oral QODAY   metoprolol succinate  25 mg Oral Daily   multivitamin with minerals  1 tablet Oral Daily   mupirocin ointment  1 application Nasal BID   rosuvastatin  5 mg Oral Q M,W,F   cyanocobalamin  1,000 mcg Oral Daily     Data Reviewed:   CBG:  Recent Labs  Lab 11/08/21 2021 11/08/21 2340  11/09/21 0419 11/09/21 0819 11/09/21 1147  GLUCAP 154* 201* 222* 158* 167*    SpO2: 94 % O2 Flow Rate (L/min): 2 L/min    Vitals:   11/08/21 2339 11/09/21 0300 11/09/21 0624 11/09/21 1432  BP: (!) 114/57 135/65 (!) 107/55 (!) 100/46  Pulse: 96 90 92 94  Resp: 18 18 16 16   Temp: 98.5 F (36.9 C) 98.6 F (37 C) 98.5 F (36.9 C) 97.8 F (36.6 C)  TempSrc:   Oral Oral  SpO2: 100%  94% 94%  Weight:      Height:         Intake/Output Summary (Last 24 hours) at 11/09/2021 1604 Last data filed at 11/09/2021 0700 Gross per 24 hour  Intake 1688 ml  Output 1500 ml  Net 188 ml    12/08 1901 - 12/10 0700 In: 2517.1 [I.V.:1929.1] Out: 2750 [Urine:2450]  Filed Weights   11/08/21 0152 11/08/21 1238  Weight: 65.4 kg 65.3 kg    Data Reviewed: Basic Metabolic Panel: Recent Labs  Lab 11/05/21 1137 11/07/21 2203 11/08/21 0351 11/09/21 0158  NA 142 139 140 135  K 3.8 3.8 4.0 3.9  CL 107 106 106 101  CO2 25 27 26 23   GLUCOSE 124* 103* 106* 216*  BUN 21 44* 44* 35*  CREATININE 1.25* 1.48* 1.40* 1.43*  CALCIUM 9.5 8.8* 9.1 8.2*   Liver Function Tests: Recent Labs  Lab 11/05/21 1137  AST 39  ALT 20  ALKPHOS 115  BILITOT 0.6  PROT 7.0  ALBUMIN 3.6   No results for input(s): LIPASE, AMYLASE in the last 168 hours. No results for input(s): AMMONIA in the last 168 hours. CBC: Recent Labs  Lab 11/05/21 1137 11/07/21 2203 11/08/21 0351 11/09/21 0158  WBC 4.1 5.6 4.7 6.1  NEUTROABS 1.8 4.4  --  5.8  HGB 9.9* 9.7* 9.8* 9.4*  HCT 30.4* 29.4* 30.5* 28.1*  MCV 98.4 99.7 101.7* 93.4  PLT 112* 90* 68* 68*     CBG: Recent Labs  Lab 11/08/21 2021 11/08/21 2340 11/09/21 0419 11/09/21 0819 11/09/21 1147  GLUCAP 154* 201* 222* 158* 167*       Radiology Reports  DG Chest 1 View  Result Date: 11/07/2021 CLINICAL DATA:  Right hip fracture, medical clearance EXAM: CHEST  1 VIEW COMPARISON:  None. FINDINGS: The lungs are symmetrically well expanded. There has  developed mild diffuse interstitial infiltrate most in keeping with mild interstitial pulmonary edema. No pneumothorax or pleural effusion. Previously noted right subclavian chest port has been removed and new right internal jugular chest port tip is seen within the right atrium. Cardiac size is within normal limits. Surgical clips are seen within the left breast. No acute bone abnormality. IMPRESSION: Mild interstitial pulmonary edema. Electronically Signed   By: Fidela Salisbury M.D.   On: 11/07/2021 22:03   DG HIP PORT UNILAT WITH PELVIS 1V RIGHT  Result Date: 11/08/2021 CLINICAL DATA:  Right hip arthroplasty postoperative film. EXAM: DG HIP (WITH OR WITHOUT PELVIS) 1V PORT RIGHT COMPARISON:  Right hip x-ray 11/07/2021. FINDINGS: There is a new right hip arthroplasty in anatomic alignment. There is no fracture. There is right hip soft tissue air compatible with recent surgery. There are surgical clips in the pelvis. IMPRESSION: 1. New right hip arthroplasty in anatomic alignment. Electronically Signed   By: Ronney Asters M.D.   On: 11/08/2021 18:35   DG Hip Unilat With Pelvis 2-3 Views Right  Result Date: 11/07/2021 CLINICAL DATA:  Fall, right hip pain EXAM: DG HIP (WITH OR WITHOUT PELVIS) 2-3V RIGHT COMPARISON:  None. FINDINGS: There is an acute, impacted right subcapital femoral neck fracture with external rotation of the femoral shaft. The femoral head is still seated within the right acetabulum. There is right hip joint space narrowing in keeping with at least mild right hip degenerative arthritis. The visualized right hemipelvis is intact. Soft tissues are unremarkable. IMPRESSION: Acute, impacted right subcapital femoral neck fracture. Electronically Signed   By: Fidela Salisbury M.D.   On: 11/07/2021 22:04       Antibiotics: Anti-infectives (From admission, onward)    Start     Dose/Rate Route Frequency Ordered Stop   11/09/21 0200  vancomycin (VANCOCIN) IVPB 1000 mg/200 mL premix  Status:   Discontinued        1,000 mg 200 mL/hr over 60 Minutes Intravenous  Once 11/08/21 2007 11/08/21 2011   11/08/21 2300  clindamycin (CLEOCIN) IVPB 900 mg        900 mg 100 mL/hr over 30 Minutes Intravenous Every 8 hours 11/08/21 2007 11/09/21 0644   11/08/21 1615  clindamycin (CLEOCIN) IVPB 900 mg        900 mg 100 mL/hr over 30 Minutes Intravenous  Once 11/08/21 1607 11/08/21 1620   11/08/21 1300  vancomycin (VANCOCIN) IVPB 1000 mg/200 mL premix        1,000 mg 200 mL/hr over 60 Minutes Intravenous On call to O.R. 11/08/21 1236 11/08/21 1403  DVT prophylaxis: Lovenox  Code Status: Full code  Family Communication: Husband at bedside   Consultants: Orthopedic surgery  Procedures:     Objective    Physical Examination:  General-appears in no acute distress Heart-S1-S2, regular, no murmur auscultated Lungs-clear to auscultation bilaterally, no wheezing or crackles auscultated Abdomen-soft, nontender, no organomegaly Extremities-no edema in the lower extremities Neuro-alert, oriented x3, no focal deficit noted  Status is: Inpatient  Dispo: The patient is from: Home              Anticipated d/c is to: Home              Anticipated d/c date is: 11/10/2021              Patient currently not stable for discharge  Barrier to discharge-none  COVID-19 Labs  No results for input(s): DDIMER, FERRITIN, LDH, CRP in the last 72 hours.  Lab Results  Component Value Date   Harmony NEGATIVE 11/07/2021            Recent Results (from the past 240 hour(s))  Resp Panel by RT-PCR (Flu A&B, Covid) Nasopharyngeal Swab     Status: None   Collection Time: 11/07/21 10:54 PM   Specimen: Nasopharyngeal Swab; Nasopharyngeal(NP) swabs in vial transport medium  Result Value Ref Range Status   SARS Coronavirus 2 by RT PCR NEGATIVE NEGATIVE Final    Comment: (NOTE) SARS-CoV-2 target nucleic acids are NOT DETECTED.  The SARS-CoV-2 RNA is generally detectable in  upper respiratory specimens during the acute phase of infection. The lowest concentration of SARS-CoV-2 viral copies this assay can detect is 138 copies/mL. A negative result does not preclude SARS-Cov-2 infection and should not be used as the sole basis for treatment or other patient management decisions. A negative result may occur with  improper specimen collection/handling, submission of specimen other than nasopharyngeal swab, presence of viral mutation(s) within the areas targeted by this assay, and inadequate number of viral copies(<138 copies/mL). A negative result must be combined with clinical observations, patient history, and epidemiological information. The expected result is Negative.  Fact Sheet for Patients:  EntrepreneurPulse.com.au  Fact Sheet for Healthcare Providers:  IncredibleEmployment.be  This test is no t yet approved or cleared by the [place] FDA and  has been authorized for detection and/or diagnosis of SARS-CoV-2 by FDA under an Emergency Use Authorization (EUA). This EUA will remain  in effect (meaning this test can be used) for the duration of the COVID-19 declaration under Section 564(b)(1) of the Act, 21 U.S.C.section 360bbb-3(b)(1), unless the authorization is terminated  or revoked sooner.       Influenza A by PCR NEGATIVE NEGATIVE Final   Influenza B by PCR NEGATIVE NEGATIVE Final    Comment: (NOTE) The Xpert Xpress SARS-CoV-2/FLU/RSV plus assay is intended as an aid in the diagnosis of influenza from Nasopharyngeal swab specimens and should not be used as a sole basis for treatment. Nasal washings and aspirates are unacceptable for Xpert Xpress SARS-CoV-2/FLU/RSV testing.  Fact Sheet for Patients: EntrepreneurPulse.com.au  Fact Sheet for Healthcare Providers: IncredibleEmployment.be  This test is not yet approved or cleared by the [place] FDA and has been  authorized for detection and/or diagnosis of SARS-CoV-2 by FDA under an Emergency Use Authorization (EUA). This EUA will remain in effect (meaning this test can be used) for the duration of the COVID-19 declaration under Section 564(b)(1) of the Act, 21 U.S.C. section 360bbb-3(b)(1), unless the authorization is terminated or revoked.  Performed at [facility]  [place] [address] [place], [place] [address]   Surgical PCR screen     Status: Abnormal   Collection Time: 11/08/21  2:52 AM   Specimen: Nasal Mucosa; Nasal Swab  Result Value Ref Range Status   MRSA, PCR NEGATIVE NEGATIVE Final   Staphylococcus aureus POSITIVE (A) NEGATIVE Final    Comment: CRITICAL RESULT CALLED TO, READ BACK BY AND VERIFIED WITH:  DAVID MAPPIS RN 11/08/21 @ 3220 VS (NOTE) The Xpert SA Assay (FDA approved for NASAL specimens in patients 53 years of age and older), is one component of a comprehensive surveillance program. It is not intended to diagnose infection nor to guide or monitor treatment. Performed at [facility], [place] [address] [place], [place] [address]     Oswald Hillock   [facility] If 7PM-7AM, please contact night-coverage at [email or web address], Office  [phone]   11/09/2021, 4:04 PM  LOS: 2 days

## 2021-11-09 NOTE — Evaluation (Addendum)
Occupational Therapy Evaluation Patient Details Name: Kayla Baker MRN: [id number] DOB: March 27, 1936 Today's Date: 11/09/2021   History of Present Illness THis 85 y.o. female admitted with Rt hip pain after sustaining a fall.  She was found to have Rt hip fx.  She underwent Rt posterior hemiarthroplasty.  PMH: ovarian CA > currently on chemo, DM, HTN, Gout, HTN, breast CA   Clinical Impression   Pt seen with husband in room, both confirming pt is highly indep with all ADLs/IADLs and mobility at baseline, currently pt endorsing pain is under control and remains at or below 3/10 throughout. Ot reviewed hip precautions with pt who was by end of session able to indep Identify 2/3. Plans to provide with handout at next session. Pt at this time limited by activity tolerance, BP/hypotension, precautions, mild discomfort and balance leading to need max A for bed mobility, mod A for transfers and ADLs. Pt and husband state their preference is for d/c to home with equip and [facility], of which pt is motivated to achieve. Session only limited this date d/t hypotension with standing. RN made aware, please see levels below. OT will continue to follow acutely.    Bed: 97/49 HR 93 RA 95 EOB:101/55 HR 93 O2 96 RA Standing: 84/50 HR 93 O2 64 RA Return to supine: 103/54 Hr 94 HR 96 RA   Recommendations for follow up therapy are one component of a multi-disciplinary discharge planning process, led by the attending physician.  Recommendations may be updated based on patient status, additional functional criteria and insurance authorization.   Follow Up Recommendations  Home health OT    Assistance Recommended at Discharge Frequent or constant Supervision/Assistance  Functional Status Assessment  Patient has had a recent decline in their functional status and demonstrates the ability to make significant improvements in function in a reasonable and predictable amount of time.  Equipment Recommendations  BSC/3in1;Other  (comment) (Hip kit, grab bars)    Recommendations for Other Services       Precautions / Restrictions Precautions Precautions: Posterior Hip Precaution Booklet Issued: No Precaution Comments: hgb, platletts Restrictions Weight Bearing Restrictions: Yes RLE Weight Bearing: Weight bearing as tolerated      Mobility Bed Mobility Overal bed mobility: Needs Assistance Bed Mobility: Supine to Sit;Sit to Supine;Rolling Rolling: Min assist   Supine to sit: Max assist Sit to supine: Max assist   General bed mobility comments: difficulty with control of LE's out of or back to supine, heavy reliance on bed rail    Transfers Overall transfer level: Needs assistance Equipment used: Rolling walker (2 wheels) Transfers: Sit to/from Stand Sit to Stand: Mod assist           General transfer comment: pt tolerated lateral scooting to [facility] with mod-max A with heavy cues for weight shiting      Balance Overall balance assessment: Mild deficits observed, not formally tested                                         ADL either performed or assessed with clinical judgement   ADL Overall ADL's : Needs assistance/impaired Eating/Feeding: Set up;Bed level   Grooming: Sitting;Wash/dry face;Wash/dry hands;Set up Grooming Details (indicate cue type and reason): simualted         Upper Body Dressing : Set up;Sitting   Lower Body Dressing: Maximal assistance;Sit to/from stand;Sitting/lateral leans Lower Body Dressing Details (indicate cue  type and reason): limited by precuations     Toileting- Clothing Manipulation and Hygiene: Maximal assistance;Sit to/from stand Toileting - Clothing Manipulation Details (indicate cue type and reason): heavy reliance on B UE support on RW throughout sesion this date       General ADL Comments: pt currently limited during session from soft BP with instance of hypotension during session; returning back to supine.     Vision          Perception     Praxis      Pertinent Vitals/Pain Pain Assessment: 0-10 Pain Score: 3  Pain Location: R hip/surgical site Pain Descriptors / Indicators: Aching;Sore;Discomfort Pain Intervention(s): Limited activity within patient's tolerance;Premedicated before session     Hand Dominance Right   Extremity/Trunk Assessment Upper Extremity Assessment Upper Extremity Assessment: Overall WFL for tasks assessed   Lower Extremity Assessment Lower Extremity Assessment: Defer to PT evaluation   Cervical / Trunk Assessment Cervical / Trunk Assessment: Normal   Communication Communication Communication: No difficulties   Cognition Arousal/Alertness: Awake/alert Behavior During Therapy: WFL for tasks assessed/performed Overall Cognitive Status: Within Functional Limits for tasks assessed                                       General Comments  bandage in place throughout session. no other concerns to note    Exercises     Shoulder Instructions      Home Living Family/patient expects to be discharged to:: Private residence Living Arrangements: Spouse/significant other Available Help at Discharge: Family;Available 24 hours/day Type of Home: House Home Access: Stairs to enter [employer] of Steps: 2 Entrance Stairs-Rails: Right Home Layout: One level     Bathroom Shower/Tub: Tub/shower unit;Walk-in Psychologist, prison and probation services: Standard     Home Equipment: Shower seat          Prior Functioning/Environment Prior Level of Function : Independent/Modified Independent             Mobility Comments: indep with mobility ADLs Comments: indep with all ADLs/IADL tasks        OT Problem List: Decreased strength;Decreased activity tolerance;Impaired balance (sitting and/or standing);Decreased safety awareness;Decreased knowledge of use of DME or AE;Decreased knowledge of precautions      OT Treatment/Interventions: Self-care/ADL  training;Therapeutic exercise;Neuromuscular education;DME and/or AE instruction;Therapeutic activities;Balance training;Patient/family education    OT Goals(Current goals can be found in the care plan section) Acute Rehab OT Goals Patient Stated Goal: to be able to go home OT Goal Formulation: With patient/family Time For Goal Achievement: 11/23/21 Potential to Achieve Goals: Good ADL Goals Pt Will Perform Upper Body Dressing: with set-up;sitting Pt Will Perform Lower Body Dressing: with supervision;sitting/lateral leans;sit to/from stand;with adaptive equipment Pt Will Transfer to Toilet: with supervision;bedside commode Pt Will Perform Toileting - Clothing Manipulation and hygiene: with supervision;sit to/from stand Pt Will Perform Tub/Shower Transfer: with supervision;with caregiver independent in assisting  OT Frequency: Min 3X/week   Barriers to D/C:            Co-evaluation              AM-PAC OT "6 Clicks" Daily Activity     Outcome Measure Help from another person eating meals?: None Help from another person taking care of personal grooming?: A Little Help from another person toileting, which includes using toliet, bedpan, or urinal?: A Lot Help from another person bathing (including washing, rinsing, drying)?: A  Lot Help from another person to put on and taking off regular upper body clothing?: A Little Help from another person to put on and taking off regular lower body clothing?: A Lot 6 Click Score: 16   End of Session Equipment Utilized During Treatment: Gait belt;Rolling walker (2 wheels) Nurse Communication: Mobility status  Activity Tolerance: Patient tolerated treatment well;Other (comment) Patient left: in bed;with call bell/phone within reach;with bed alarm set;with family/visitor present  OT Visit Diagnosis: Unsteadiness on feet (R26.81)                Time: 4492-0100 OT Time Calculation (min): 54 min Charges:  OT General Charges $OT Visit: 1  Visit OT Evaluation $OT Eval Low Complexity: 1 Low OT Treatments $Self Care/Home Management : 23-37 mins Jhalil Silvera OTR/L acute rehab services Office: [phone]

## 2021-11-09 NOTE — Progress Notes (Signed)
Patient ID: Kayla Baker, female   DOB: 07/05/1936, 85 y.o.   MRN: [id number]   ORTHOPAEDIC PROGRESS NOTE  s/p Procedure(s): ARTHROPLASTY BIPOLAR HIP (HEMIARTHROPLASTY) POSTERIOR  SUBJECTIVE: Reports mild pain about operative site. No chest pain. No SOB. No nausea/vomiting. No other complaints.  OBJECTIVE: PE:  Vitals:   11/09/21 0300 11/09/21 0624  BP: 135/65 (!) 107/55  Pulse: 90 92  Resp: 18 16  Temp: 98.6 F (37 C) 98.5 F (36.9 C)  SpO2:  94%   Dressing intact   No drainage  ASSESSMENT: Kayla Baker is a 85 y.o. female doing well postoperatively.  PLAN: Weightbearing: WBAT RLE Insicional and dressing care: Dressings left intact until follow-up Orthopedic device(s): None Showering: ok to shower VTE prophylaxis: Lovenox 21m qd  30 days Pain control: well controlled Follow - up plan:  If she progresses well with PT will plan on DC to home with home health physical therapy.   Contact information:

## 2021-11-10 LAB — GLUCOSE, CAPILLARY
Glucose-Capillary: 123 mg/dL — ABNORMAL HIGH (ref 70–99)
Glucose-Capillary: 75 mg/dL (ref 70–99)
Glucose-Capillary: 93 mg/dL (ref 70–99)

## 2021-11-10 MED ORDER — HYDROCODONE-ACETAMINOPHEN 5-325 MG PO TABS: 1.0000 | ORAL_TABLET | Freq: Four times a day (QID) | ORAL | 0 refills | Status: DC | PRN

## 2021-11-10 MED ORDER — ENOXAPARIN SODIUM 40 MG/0.4ML IJ SOSY: 40.0000 mg | PREFILLED_SYRINGE | INTRAMUSCULAR | 0 refills | Status: DC

## 2021-11-10 MED ORDER — MUPIROCIN 2 % EX OINT: 1.0000 "application " | TOPICAL_OINTMENT | Freq: Two times a day (BID) | CUTANEOUS | 0 refills | Status: DC

## 2021-11-10 MED ORDER — POLYETHYLENE GLYCOL 3350 17 G PO PACK: 17.0000 g | PACK | Freq: Two times a day (BID) | ORAL | 0 refills | Status: DC

## 2021-11-10 MED ORDER — ACETAMINOPHEN 325 MG PO TABS
650.0000 mg | ORAL_TABLET | Freq: Four times a day (QID) | ORAL | Status: DC | PRN
  Administered 2021-11-10: 650 mg via ORAL
  Filled 2021-11-10: qty 2

## 2021-11-10 NOTE — Progress Notes (Signed)
Physical Therapy Treatment Patient Details Name: Kayla Baker MRN: [id number] DOB: 1936/02/07 Today's Date: 11/10/2021   History of Present Illness Pt is 85 y.o. female admitted on 11/07/21 with Rt hip pain after sustaining a fall.  She was found to have Rt hip fx.  She underwent Rt posterior hemiarthroplasty.  PMH: ovarian CA > currently on chemo, DM, HTN, Gout, HTN, breast CA    PT Comments    Continuing work on functional mobility and activity tolerance;  session focused on progressive amb and stair training in prep for dc home;  Pt and family performed well; Questions solicited and answered; Discussed car transfers as well; OK for dc home from PT standpoint   Recommendations for follow up therapy are one component of a multi-disciplinary discharge planning process, led by the attending physician.  Recommendations may be updated based on patient status, additional functional criteria and insurance authorization.  Follow Up Recommendations  Outpatient PT     Assistance Recommended at Discharge Intermittent Supervision/Assistance  Equipment Recommendations  Rolling walker (2 wheels)    Recommendations for Other Services       Precautions / Restrictions Precautions Precautions: Posterior Hip;Fall Precaution Booklet Issued: Yes (comment) Precaution Comments: hgb, platletts Restrictions RLE Weight Bearing: Weight bearing as tolerated     Mobility  Bed Mobility Overal bed mobility: Needs Assistance Bed Mobility: Supine to Sit     Supine to sit: Min assist     General bed mobility comments: Pt. transfers from sup > sit with light min A for R LE negotiation and HH support to sit up.  Educated to maintain UEs behind pelvis to push pelvis to EOB without breaking hip precautions.  Demos good sitting balance.    Transfers Overall transfer level: Needs assistance Equipment used: Rolling walker (2 wheels) Transfers: Sit to/from Stand Sit to Stand: Min guard (eithout physical  contact)           General transfer comment: Cues for pre-positioning for post hip prec; noted pt's husband giving correct cues as well    Ambulation/Gait Ambulation/Gait assistance: Min guard (eithout physical contact) Gait Distance (Feet): 100 Feet Assistive device: Rolling walker (2 wheels) Gait Pattern/deviations: Step-through pattern (emerging)       General Gait Details: Discussed using RW to unweigh R hip in stance when needed; Overall taking good steps and showing good self-monitor for activity tolerance   Stairs Stairs: Yes Stairs assistance: Min guard Stair Management: One rail Right;Step to pattern;Sideways;No rails;With walker;Forwards;Backwards Number of Stairs: 3 (and then practiced with the single step) General stair comments: verbal and demo cues for sequence and technqiue; good return demo; first practiced 3 steps with one rail, then practiced single step with RW both forward a backward   Wheelchair Mobility    Modified Rankin (Stroke Patients Only)       Balance     Sitting balance-Leahy Scale: Fair       Standing balance-Leahy Scale: Poor                              Cognition Arousal/Alertness: Awake/alert Behavior During Therapy: WFL for tasks assessed/performed Overall Cognitive Status: Within Functional Limits for tasks assessed                                          Exercises      General  Comments General comments (skin integrity, edema, etc.): Checked in with pt re any pre-syncopal symptoms, and she did not report any; family present during session and very helpful, asking excellent questions      Pertinent Vitals/Pain Pain Assessment: 0-10 Pain Score: 2  Pain Location: R hip Pain Descriptors / Indicators: Aching;Discomfort Pain Intervention(s): Monitored during session    Home Living                          Prior Function            PT Goals (current goals can now be found in  the care plan section) Acute Rehab PT Goals Patient Stated Goal: home today PT Goal Formulation: With patient Time For Goal Achievement: 11/23/21 Potential to Achieve Goals: Good Progress towards PT goals: Progressing toward goals    Frequency    Min 5X/week      PT Plan Current plan remains appropriate    Co-evaluation              AM-PAC PT "6 Clicks" Mobility   Outcome Measure  Help needed turning from your back to your side while in a flat bed without using bedrails?: A Little Help needed moving from lying on your back to sitting on the side of a flat bed without using bedrails?: A Little Help needed moving to and from a bed to a chair (including a wheelchair)?: A Little Help needed standing up from a chair using your arms (e.g., wheelchair or bedside chair)?: None Help needed to walk in hospital room?: A Little Help needed climbing 3-5 steps with a railing? : A Little 6 Click Score: 19    End of Session Equipment Utilized During Treatment: Gait belt Activity Tolerance: Patient tolerated treatment well Patient left: in chair;with call bell/phone within reach;with family/visitor present Nurse Communication: Mobility status PT Visit Diagnosis: Other abnormalities of gait and mobility (R26.89);History of falling (Z91.81)     Time: 0737-1062 PT Time Calculation (min) (ACUTE ONLY): 29 min  Charges:  $Gait Training: 8-22 mins $Therapeutic Activity: 8-22 mins                     Roney Marion, PT  Acute Rehabilitation Services Pager [phone] Office [phone]    Kayla Baker 11/10/2021, 12:35 PM

## 2021-11-10 NOTE — TOC Initial Note (Signed)
Transition of Care [facility]) - Initial/Assessment Note    Patient Details  Name: Kayla Baker MRN: [id number] Date of Birth: [birth year]  Transition of [facility]) CM/SW Contact:    Bartholomew Crews, RN Phone Number: [phone] 11/10/2021, 11:21 AM  Clinical Narrative:                  Spoke with patient, spouse, and daughter at the bedside to discuss post acute transition planning. Agreeable to home health. Discussed agency choice per medicare.gov quality standards. Referral accepted by [facility] for PT, OT. Discussed needed DME - has "potty chair" at home, but needs walker. Referral to [facility] for delivey to the room. Demographics, PCP, and preferred pharmacy verified. Family to provide transportation home. No further TOC needs identified.   Expected Discharge Plan: [place] Barriers to Discharge: No Barriers Identified   Patient Goals and CMS Choice Patient states their goals for this hospitalization and ongoing recovery are:: return home with husband CMS Medicare.gov Compare Post Acute Care list provided to:: Patient Choice offered to / list presented to : Patient  Expected Discharge Plan and Services Expected Discharge Plan: [place] In-house Referral: NA Discharge Planning Services: CM Consult Post Acute Care Choice: Durable Medical Equipment, Home Health Living arrangements for the past 2 months: Single Family Home Expected Discharge Date: 11/10/21               DME Arranged: [place] Rile rolling DME Agency: [facility] Date DME Agency Contacted: 11/10/21 Time DME Agency Contacted: [phone] Representative spoke with at DME Agency: [place]: PT, OT [place] Agency: [place] Date [place]: 11/10/21 Time [place]: 1119 Representative spoke with at [place]: Amy  Prior Living Arrangements/Services Living arrangements for the past 2 months: [place] with:: Self, Spouse Patient language and need  for interpreter reviewed:: Yes Do you feel safe going back to the place where you live?: Yes      Need for Family Participation in Patient Care: Yes (Comment) Care giver support system in place?: Yes (comment) Current home services: DME ("potty chair") Criminal Activity/Legal Involvement Pertinent to Current Situation/Hospitalization: No - Comment as needed  Activities of Daily Living Home Assistive Devices/Equipment: CBG Meter, Grab bars in shower, Scales, Shower chair with back ADL Screening (condition at time of admission) Patient's cognitive ability adequate to safely complete daily activities?: Yes Is the patient deaf or have difficulty hearing?: No Does the patient have difficulty seeing, even when wearing glasses/contacts?: No Does the patient have difficulty concentrating, remembering, or making decisions?: No Patient able to express need for assistance with ADLs?: Yes Does the patient have difficulty dressing or bathing?: Yes Independently performs ADLs?: No Communication: Independent Dressing (OT): Needs assistance Is this a change from baseline?: Change from baseline, expected to last <3days Grooming: Needs assistance Is this a change from baseline?: Change from baseline, expected to last <3 days Feeding: Independent Bathing: Needs assistance Is this a change from baseline?: Change from baseline, expected to last <3 days Toileting: Needs assistance Is this a change from baseline?: Change from baseline, expected to last <3 days In/Out Bed: Dependent Is this a change from baseline?: Change from baseline, expected to last <3 days Walks in Home: Needs assistance Is this a change from baseline?: Change from baseline, expected to last <3 days Does the patient have difficulty walking or climbing stairs?: Yes Weakness of Legs: Right Weakness of Arms/Hands: None  Permission Sought/Granted Permission sought to  share information with : Family Supports Permission granted to share  information with : Yes, Verbal Permission Granted  Share Information with NAME: Berenice Oehlert     Permission granted to share info w Relationship: spouse  Permission granted to share info w Contact Information: [phone]  Emotional Assessment Appearance:: Appears stated age Attitude/Demeanor/Rapport: Engaged Affect (typically observed): Accepting Orientation: : Oriented to Self, Oriented to Place, Oriented to  Time, Oriented to Situation Alcohol / Substance Use: Not Applicable Psych Involvement: No (comment)  Admission diagnosis:  Closed fracture of right hip, initial encounter ([place]) [S72.001A] Malignant neoplasm of ovary, unspecified laterality ([place]) [C56.9] Closed displaced fracture of right femoral neck ([place]) [S72.001A] Patient Active Problem List   Diagnosis Date Noted   Closed displaced fracture of right femoral neck ([place]) 11/07/2021   Poor dentition 07/26/2021   Nodule of lower lobe of right lung 06/27/2021   Aortic atherosclerosis ([place]) by Abd CT scan on 05/10/2021 06/10/2021   Elevated liver enzymes 03/12/2021   Mild nonproliferative diabetic retinopathy of both eyes without macular edema associated with type 2 diabetes mellitus ([place]) 02/20/2021   Osteopenia 02/20/2021   Deficiency anemia 11/02/2020   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   Genetic testing 09/14/2020   Pancytopenia, acquired ([place]) 05/15/2020   Cancer associated pain 05/15/2020   Anemia in neoplastic disease 05/03/2020   Goals of care, counseling/discussion 04/20/2020   Ovarian cancer ([place]) 04/19/2020   Edema of right lower leg 04/05/2020   CKD stage 3 due to type 2 diabetes mellitus ([place]) 07/05/2018   Adhesive capsulitis of right shoulder 04/27/2018   History of breast cancer 04/01/2017   Postoperative breast asymmetry 04/01/2017   Port catheter in place 09/18/2016   Chemotherapy-induced peripheral neuropathy ([place]) 08/07/2016   Breast cancer of upper-inner quadrant of left female  breast ([place]) 02/29/2016   Gout 12/08/2014   Diabetic neuropathy, painful ([place]) 08/28/2014   Medication management 08/27/2014   Essential hypertension 11/10/2013   Hyperlipidemia associated with type 2 diabetes mellitus ([place]) 11/10/2013   T2_NIDDM w/CKD3 ([place]) 11/10/2013   Vitamin D deficiency 11/10/2013   PCP:  [name], MD Pharmacy:   [facility] [place], [place] - [address] AT [place] [address] [place] [place] [address] Phone: [phone] Fax: [phone]  [id number] [place], [address] AT [address] [place] [address] Phone: [phone] Fax: [place] [phone] - [place], [place] - [address] [place] [address] Phone: [phone] Fax: [place] [address] Phone: [phone] Fax: [place], [place] [address] Phone: [phone] Fax: [phone]     Social Determinants of Health (SDOH) Interventions    Readmission Risk Interventions No flowsheet data found.

## 2021-11-10 NOTE — Progress Notes (Signed)
[facility]  PROGRESS NOTE  Kayla Baker DPO:[id number] DOB: [birth year] DOA: 11/07/2021 PCP: [name], MD   Brief HPI:   85 year old female with a history of ovarian carcinoma on chemotherapy, diabetes mellitus type 2, hypertension lost her balance at home on level ground when she fell on her right hip.  Patient immediately reported right hip pain post fall and was unable to bear weight.  In the ED imaging showed right hip fracture.  Orthopedics was consulted.  Patient was transferred to [facility] for surgery. She underwent right hemiarthroplasty   Subjective   Wants to go home with home health PT.   Assessment/Plan:     Right femoral neck fracture -S/p right hemiarthroplasty -Orthopedics following -Lovenox for DVT prophylaxis for 30 days as per orthopedics   CKD stage IIIb -Creatinine at baseline  Hypertension -BP has improved -Patient is on metoprolol -She takes HCTZ 25 mg daily at home  Normocytic anemia -Hemoglobin at baseline  Ovarian carcinoma -Currently on chemotherapy, last chemo on 12/6 -Followed by oncology as outpatient  Diabetes mellitus type 2 -Continue home medications  Medications     allopurinol  150 mg Oral Daily   Chlorhexidine Gluconate Cloth  6 each Topical Daily   cholecalciferol  5,000 Units Oral Daily   enoxaparin  40 mg Subcutaneous Q24H   ezetimibe  10 mg Oral Daily   feeding supplement (GLUCERNA SHAKE)  237 mL Oral BID BM   gabapentin  300 mg Oral Daily   And   gabapentin  600 mg Oral QHS   insulin aspart  0-9 Units Subcutaneous TID WC   magnesium oxide  200 mg Oral QODAY   metoprolol succinate  25 mg Oral Daily   multivitamin with minerals  1 tablet Oral Daily   mupirocin ointment  1 application Nasal BID   rosuvastatin  5 mg Oral Q M,W,F   cyanocobalamin  1,000 mcg Oral Daily     Data Reviewed:   CBG:  Recent Labs  Lab 11/09/21 2027 11/09/21 2330 11/10/21 0349 11/10/21 0754 11/10/21 1209   GLUCAP 129* 121* 123* 75 93    SpO2: 95 % O2 Flow Rate (L/min): 2 L/min    Vitals:   11/09/21 0624 11/09/21 1432 11/09/21 1946 11/10/21 0755  BP: (!) 107/55 (!) 100/46 (!) 102/45 138/67  Pulse: 92 94 85 92  Resp: 16 16 18 18   Temp: 98.5 F (36.9 C) 97.8 F (36.6 C) 98.2 F (36.8 C) 98.2 F (36.8 C)  TempSrc: Oral Oral Oral Oral  SpO2: 94% 94% 95% 95%  Weight:      Height:         Intake/Output Summary (Last 24 hours) at 11/10/2021 1452 Last data filed at 11/10/2021 0718 Gross per 24 hour  Intake --  Output 800 ml  Net -800 ml    12/09 1901 - 12/11 0700 In: -  Out: 1700 [Urine:1700]  Filed Weights   11/08/21 0152 11/08/21 1238  Weight: 65.4 kg 65.3 kg    Data Reviewed: Basic Metabolic Panel: Recent Labs  Lab 11/05/21 1137 11/07/21 2203 11/08/21 0351 11/09/21 0158  NA 142 139 140 135  K 3.8 3.8 4.0 3.9  CL 107 106 106 101  CO2 25 27 26 23   GLUCOSE 124* 103* 106* 216*  BUN 21 44* 44* 35*  CREATININE 1.25* 1.48* 1.40* 1.43*  CALCIUM 9.5 8.8* 9.1 8.2*   Liver Function Tests: Recent Labs  Lab 11/05/21 1137  AST 39  ALT 20  ALKPHOS  115  BILITOT 0.6  PROT 7.0  ALBUMIN 3.6   No results for input(s): LIPASE, AMYLASE in the last 168 hours. No results for input(s): AMMONIA in the last 168 hours. CBC: Recent Labs  Lab 11/05/21 1137 11/07/21 2203 11/08/21 0351 11/09/21 0158  WBC 4.1 5.6 4.7 6.1  NEUTROABS 1.8 4.4  --  5.8  HGB 9.9* 9.7* 9.8* 9.4*  HCT 30.4* 29.4* 30.5* 28.1*  MCV 98.4 99.7 101.7* 93.4  PLT 112* 90* 68* 68*     CBG: Recent Labs  Lab 11/09/21 2027 11/09/21 2330 11/10/21 0349 11/10/21 0754 11/10/21 1209  GLUCAP 129* 121* 123* 75 93       Radiology Reports  DG HIP PORT UNILAT WITH PELVIS 1V RIGHT  Result Date: 11/08/2021 CLINICAL DATA:  Right hip arthroplasty postoperative film. EXAM: DG HIP (WITH OR WITHOUT PELVIS) 1V PORT RIGHT COMPARISON:  Right hip x-ray 11/07/2021. FINDINGS: There is a new right hip  arthroplasty in anatomic alignment. There is no fracture. There is right hip soft tissue air compatible with recent surgery. There are surgical clips in the pelvis. IMPRESSION: 1. New right hip arthroplasty in anatomic alignment. Electronically Signed   By: Ronney Asters M.D.   On: 11/08/2021 18:35       Antibiotics: Anti-infectives (From admission, onward)    Start     Dose/Rate Route Frequency Ordered Stop   11/09/21 0200  vancomycin (VANCOCIN) IVPB 1000 mg/200 mL premix  Status:  Discontinued        1,000 mg 200 mL/hr over 60 Minutes Intravenous  Once 11/08/21 2007 11/08/21 2011   11/08/21 2300  clindamycin (CLEOCIN) IVPB 900 mg        900 mg 100 mL/hr over 30 Minutes Intravenous Every 8 hours 11/08/21 2007 11/09/21 0644   11/08/21 1615  clindamycin (CLEOCIN) IVPB 900 mg        900 mg 100 mL/hr over 30 Minutes Intravenous  Once 11/08/21 1607 11/08/21 1620   11/08/21 1300  vancomycin (VANCOCIN) IVPB 1000 mg/200 mL premix        1,000 mg 200 mL/hr over 60 Minutes Intravenous On call to O.R. 11/08/21 1236 11/08/21 1403         DVT prophylaxis: Lovenox  Code Status: Full code  Family Communication: Husband at bedside   Consultants: Orthopedic surgery  Procedures:     Objective    Physical Examination:  General-appears in no acute distress Heart-S1-S2, regular, no murmur auscultated Lungs-clear to auscultation bilaterally, no wheezing or crackles auscultated Abdomen-soft, nontender, no organomegaly Extremities-no edema in the lower extremities Neuro-alert, oriented x3, no focal deficit noted  Status is: Inpatient  Dispo: The patient is from: Home              Anticipated d/c is to: Home              Anticipated d/c date is: 11/10/2021              Patient currently not stable for discharge  Barrier to discharge-none  COVID-19 Labs  No results for input(s): DDIMER, FERRITIN, LDH, CRP in the last 72 hours.  Lab Results  Component Value Date    [place] NEGATIVE 11/07/2021            Recent Results (from the past 240 hour(s))  Resp Panel by RT-PCR (Flu A&B, Covid) Nasopharyngeal Swab     Status: None   Collection Time: 11/07/21 10:54 PM   Specimen: Nasopharyngeal Swab; Nasopharyngeal(NP) swabs in vial transport medium  Result Value Ref Range Status   SARS Coronavirus 2 by RT PCR NEGATIVE NEGATIVE Final    Comment: (NOTE) SARS-CoV-2 target nucleic acids are NOT DETECTED.  The SARS-CoV-2 RNA is generally detectable in upper respiratory specimens during the acute phase of infection. The lowest concentration of SARS-CoV-2 viral copies this assay can detect is 138 copies/mL. A negative result does not preclude SARS-Cov-2 infection and should not be used as the sole basis for treatment or other patient management decisions. A negative result may occur with  improper specimen collection/handling, submission of specimen other than nasopharyngeal swab, presence of viral mutation(s) within the areas targeted by this assay, and inadequate number of viral copies(<138 copies/mL). A negative result must be combined with clinical observations, patient history, and epidemiological information. The expected result is Negative.  Fact Sheet for Patients:  EntrepreneurPulse.com.au  Fact Sheet for Healthcare Providers:  IncredibleEmployment.be  This test is no t yet approved or cleared by the [place] FDA and  has been authorized for detection and/or diagnosis of SARS-CoV-2 by FDA under an Emergency Use Authorization (EUA). This EUA will remain  in effect (meaning this test can be used) for the duration of the COVID-19 declaration under Section 564(b)(1) of the Act, 21 U.S.C.section 360bbb-3(b)(1), unless the authorization is terminated  or revoked sooner.       Influenza A by PCR NEGATIVE NEGATIVE Final   Influenza B by PCR NEGATIVE NEGATIVE Final    Comment: (NOTE) The Xpert Xpress  SARS-CoV-2/FLU/RSV plus assay is intended as an aid in the diagnosis of influenza from Nasopharyngeal swab specimens and should not be used as a sole basis for treatment. Nasal washings and aspirates are unacceptable for Xpert Xpress SARS-CoV-2/FLU/RSV testing.  Fact Sheet for Patients: EntrepreneurPulse.com.au  Fact Sheet for Healthcare Providers: IncredibleEmployment.be  This test is not yet approved or cleared by the [place] FDA and has been authorized for detection and/or diagnosis of SARS-CoV-2 by FDA under an Emergency Use Authorization (EUA). This EUA will remain in effect (meaning this test can be used) for the duration of the COVID-19 declaration under Section 564(b)(1) of the Act, 21 U.S.C. section 360bbb-3(b)(1), unless the authorization is terminated or revoked.  Performed at [facility], [place] [address] [place], [place] [address]   Surgical PCR screen     Status: Abnormal   Collection Time: 11/08/21  2:52 AM   Specimen: Nasal Mucosa; Nasal Swab  Result Value Ref Range Status   MRSA, PCR NEGATIVE NEGATIVE Final   Staphylococcus aureus POSITIVE (A) NEGATIVE Final    Comment: CRITICAL RESULT CALLED TO, READ BACK BY AND VERIFIED WITH:  DAVID MAPPIS RN 11/08/21 @ 9892 VS (NOTE) The Xpert SA Assay (FDA approved for NASAL specimens in patients 38 years of age and older), is one component of a comprehensive surveillance program. It is not intended to diagnose infection nor to guide or monitor treatment. Performed at [facility], [place] [address], [place], [place] [address]     Oswald Hillock   [facility] If 7PM-7AM, please contact night-coverage at [email or web address], Office  [phone]   11/10/2021, 2:52 PM  LOS: 3 days

## 2021-11-10 NOTE — Discharge Summary (Signed)
Patient ID: YUDIT MODESITT MRN: [id number] DOB/AGE: 1936-01-30 85 y.o.  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 ([place])   Chemotherapy-induced peripheral neuropathy ([place])   CKD stage 3 due to type 2 diabetes mellitus ([place])   Ovarian cancer ([place])   Anemia in neoplastic disease   Pancytopenia, acquired ([place])   Discharge Diagnoses:  Same  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[place]) 02/29/2016   skin- 2016- squamous- on right upper arm   Chronic kidney disease    Diabetes mellitus without complication (HCC)    GERD (gastroesophageal reflux disease)    Gout    takes Allopurinol daily   History of chemotherapy    History of colon polyps    benign   History of shingles    Hx of radiation therapy    Hyperlipidemia    Hypertension    Ovarian ca ([place]) dx'd 03/2020   Personal history of chemotherapy    2017   Personal history of radiation therapy    2017   Vitamin D deficiency     Surgeries: Procedure(s): ARTHROPLASTY BIPOLAR HIP (HEMIARTHROPLASTY) POSTERIOR on 11/08/2021   Consultants: Treatment Team:  Marchia Bond, MD  Discharged Condition: Improved  Hospital Course: AUDREANNA TORRISI is an 85 y.o. female who was admitted 11/07/2021 for operative treatment ofClosed displaced fracture of right femoral neck ([place]).   Dariona RAPHAEL ESPE is a 85 y.o. female with history of GERD, gout, breast cancer, HTN, ovarian cancer currently on chemotherapy who complains of  right hip pain after a fall. Yesterday she was walking to fix something on her tv and fell directly onto her right side. She noticed severe pain when trying to get up. She was taken to the [facility] ED where she was found to have a  right hip fracture. She does not walk with an assistive device at baseline. This morning pain is a 4/10 worse with movement and better with IV pain medication. She has some abdominal pain yesterday but this has resolved overnight. She denies chest pain, shortness of breath, nausea, or vomiting. Denies pain elsewhere. Patient has severe unremitting pain that affects sleep, daily activities, and work/hobbies.   After pre-op clearance the patient was taken to the operating room on 11/08/2021 and underwent  Procedure(s): ARTHROPLASTY BIPOLAR HIP (HEMIARTHROPLASTY) POSTERIOR.    Patient was given perioperative antibiotics:  Anti-infectives (From admission, onward)    Start     Dose/Rate Route Frequency Ordered Stop   11/09/21 0200  vancomycin (VANCOCIN) IVPB 1000 mg/200 mL premix  Status:  Discontinued        1,000 mg 200 mL/hr over 60 Minutes Intravenous  Once 11/08/21 2007 11/08/21 2011   11/08/21 2300  clindamycin (CLEOCIN) IVPB 900 mg        900 mg 100 mL/hr over 30 Minutes Intravenous Every 8 hours 11/08/21 2007 11/09/21 0644   11/08/21 1615  clindamycin (CLEOCIN) IVPB 900 mg        900 mg 100 mL/hr over 30 Minutes Intravenous  Once 11/08/21 1607 11/08/21 1620   11/08/21 1300  vancomycin (VANCOCIN) IVPB 1000 mg/200 mL premix        1,000 mg 200 mL/hr over 60 Minutes  Intravenous On call to O.R. 11/08/21 1236 11/08/21 1403        Patient was given sequential compression devices, early ambulation, and chemoprophylaxis to prevent DVT.  Patient benefited maximally from hospital stay and there were no complications.    Recent vital signs: Patient Vitals for the past 24 hrs:  BP Temp Temp src Pulse Resp SpO2  11/10/21 0755 138/67 98.2 F (36.8 C) Oral 92 18 95 %  11/09/21 1946 (!) 102/45 98.2 F (36.8 C) Oral 85 18 95 %  11/09/21 1432 (!) 100/46 97.8 F (36.6 C) Oral 94 16 94 %     Recent laboratory studies:  Recent Labs    11/07/21 2203 11/08/21 0351 11/09/21 0158  WBC 5.6 4.7  6.1  HGB 9.7* 9.8* 9.4*  HCT 29.4* 30.5* 28.1*  PLT 90* 68* 68*  NA 139 140 135  K 3.8 4.0 3.9  CL 106 106 101  CO2 27 26 23   BUN 44* 44* 35*  CREATININE 1.48* 1.40* 1.43*  GLUCOSE 103* 106* 216*  INR 1.1  --   --   CALCIUM 8.8* 9.1 8.2*     Discharge Medications:   Allergies as of 11/10/2021       Reactions   Keflex [cephalexin] Swelling   Tongue and throat swells   Meloxicam Swelling   Prednisone Swelling, Other (See Comments)   Can Not take High doses Cannot tolerate high doses Can Not take High doses   Premarin [estrogens Conjugated] Hives   Trovan [alatrofloxacin] Other (See Comments)   Unknown reaction        Medication List     TAKE these medications    acetaminophen 325 MG tablet Commonly known as: TYLENOL Take 325 mg by mouth every 6 (six) hours as needed for headache.   allopurinol 300 MG tablet Commonly known as: ZYLOPRIM TAKE 1 TABLET BY MOUTH DAILY FOR GOUT PREVENTION What changed:  how much to take how to take this when to take this additional instructions   blood glucose meter kit and supplies Kit Dispense based on patient and insurance preference. Use to check blood sugar once daily. Dx: E11.22, N18.30   cyanocobalamin 1000 MCG tablet Take 1,000 mcg by mouth daily.   enoxaparin 40 MG/0.4ML injection Commonly known as: LOVENOX Inject 0.4 mLs (40 mg total) into the skin daily. Start taking on: November 11, 2021   ezetimibe 10 MG tablet Commonly known as: ZETIA Patient knows to take by mouth  !   - Thanks What changed:  how much to take how to take this when to take this additional instructions   freestyle lancets CHECK BLOOD SUGAR DAILY AS DIRECTED   FREESTYLE TEST STRIPS test strip Generic drug: glucose blood USE TO CHECK BLOOD GLUCOSE ONCE DAILY   gabapentin 300 MG capsule Commonly known as: NEURONTIN Take 1 capsule in the morning and afternoon and 2 capsules at night   glimepiride 1 MG tablet Commonly known as:  Amaryl Take 1 tab by mouth in the morning if fasting is running 150+. Please do not take this medication if you are ill or not eating as this can cause a low blood sugar. What changed:  how much to take how to take this when to take this additional instructions   hydrochlorothiazide 25 MG tablet Commonly known as: HYDRODIURIL TAKE ONE TABLET BY MOUTH DAILY FOR FOR BLOOD PRESSURE AND FLUID RETENTION/ ANKLE SWELLING What changed:  how much to take how to take this when to take this  HYDROcodone-acetaminophen 5-325 MG tablet Commonly known as: NORCO/VICODIN Take 1-2 tablets by mouth every 6 (six) hours as needed for moderate pain.   lidocaine-prilocaine cream Commonly known as: EMLA Apply to affected area once What changed:  how much to take when to take this reasons to take this additional instructions   loratadine 10 MG tablet Commonly known as: CLARITIN Take 10 mg by mouth daily as needed for allergies.   Magnesium 250 MG Tabs Take 250 mg by mouth every other day.   metoprolol succinate 25 MG 24 hr tablet Commonly known as: TOPROL-XL Take  1 tablet  Daily  for BP       /TAKE 1 TABLET BY MOUTH DAILY FOR BLOOD PRESSURE What changed:  how much to take how to take this when to take this additional instructions   mupirocin ointment 2 % Commonly known as: BACTROBAN Place 1 application into the nose 2 (two) times daily.   ondansetron 8 MG tablet Commonly known as: Zofran Take 1 tablet (8 mg total) by mouth 2 (two) times daily as needed for refractory nausea / vomiting. Start on day 3 after carboplatin chemo.   Pfizer-BioNT COVID-19 Vac-TriS Susp injection Generic drug: COVID-19 mRNA Vac-TriS (Pfizer)   polyethylene glycol 17 g packet Commonly known as: MiraLax Take 17 g by mouth 2 (two) times daily. 17 grams in 6 oz of favorite drink twice a day until bowel movement.  LAXITIVE.  Restart if two days since last bowel movement   prochlorperazine 10 MG  tablet Commonly known as: COMPAZINE Take 1 tablet (10 mg total) by mouth every 6 (six) hours as needed for nausea or vomiting.   rosuvastatin 5 MG tablet Commonly known as: CRESTOR Take 1 tablet (5 mg total) by mouth 3 (three) times a week. In the evening for cholesterol. What changed: when to take this   trolamine salicylate 10 % cream Commonly known as: ASPERCREME Apply 1 application topically as needed for muscle pain (Use if you still feel tingles in feet in the morning after taking Gabapentin).   vitamin C 500 MG tablet Commonly known as: ASCORBIC ACID Take 500 mg by mouth every evening.   Vitamin D3 125 MCG (5000 UT) Caps Take 5,000 Units by mouth daily.               Durable Medical Equipment  (From admission, onward)           Start     Ordered   11/10/21 1114  For home use only DME Walker rolling  Once       Question Answer Comment  Walker: With 5 Inch Wheels   Patient needs a walker to treat with the following condition Decreased functional mobility and endurance      11/10/21 1114            Diagnostic Studies: DG Chest 1 View  Result Date: 11/07/2021 CLINICAL DATA:  Right hip fracture, medical clearance EXAM: CHEST  1 VIEW COMPARISON:  None. FINDINGS: The lungs are symmetrically well expanded. There has developed mild diffuse interstitial infiltrate most in keeping with mild interstitial pulmonary edema. No pneumothorax or pleural effusion. Previously noted right subclavian chest port has been removed and new right internal jugular chest port tip is seen within the right atrium. Cardiac size is within normal limits. Surgical clips are seen within the left breast. No acute bone abnormality. IMPRESSION: Mild interstitial pulmonary edema. Electronically Signed   By: Fidela Salisbury M.D.   On: 11/07/2021 22:03  DG Bone Density  Result Date: 10/16/2021 EXAM: DUAL X-RAY ABSORPTIOMETRY (DXA) FOR BONE MINERAL DENSITY IMPRESSION: Referring Physician:  Liane Comber Your patient completed a bone mineral density test using GE Lunar iDXA system (analysis version: 16). Technologist: [place] PATIENT: Name: Javonna, Balli Patient ID: [id number] Birth Date: Sep 01, 1936 Height: 63.0 in. Sex: Female Measured: 10/16/2021 Weight: 137.0 lbs. Indications: Advanced Age, Bilateral Ovariectomy (65.51), Breast Cancer History, Caucasian, Diabetic non insulin, Estrogen Deficient, Gabapentin, Hysterectomy, Ovarian Cancer, Postmenopausal, Secondary Osteoporosis Fractures: None Treatments: Hormone Therapy For Cancer, Vitamin D (E933.5) ASSESSMENT: The BMD measured at Femur Neck Right is 0.752 g/cm2 with a T-score of -2.1. This patient is considered osteopenic/low bone mass according to [facility]) criteria. The quality of the exam is good. Site Region Measured Date Measured Age YA BMD Significant CHANGE T-score DualFemur Neck Right 10/16/2021 83.5 -2.1 0.752 g/cm2 * DualFemur Neck Right 05/12/2019 81.0 -1.6 0.815 g/cm2 * AP Spine L1-L4 10/16/2021 83.5 -1.2 1.051 g/cm2 * AP Spine  L1-L4      05/12/2019    83.0         -0.6    1.124 g/cm2 DualFemur Total Mean 10/16/2021 83.5 -1.3 0.849 g/cm2 * DualFemur Total Mean 05/12/2019 81.0 -0.5 0.940 g/cm2 * World Health Organization [facility]) criteria for post-menopausal, Caucasian Women: Normal       T-score at or above -1 SD Osteopenia   T-score between -1 and -2.5 SD Osteoporosis T-score at or below -2.5 SD RECOMMENDATION: 1. All patients should optimize calcium and vitamin D intake. 2. Consider FDA-approved medical therapies in postmenopausal women and men aged 10 years and older, based on the following: a. A hip or vertebral (clinical or morphometric) fracture. b. T-score = -2.5 at the femoral neck or spine after appropriate evaluation to exclude secondary causes. c. Low bone mass (T-score between -1.0 and -2.5 at the femoral neck or spine) and a 10-year probability of a hip fracture = 3% or a 10-year probability of a major  osteoporosis-related fracture = 20% based on the [place]-adapted WHO algorithm. d. Clinician judgment and/or patient preferences may indicate treatment for people with 10-year fracture probabilities above or below these levels. FOLLOW-UP: Patients with diagnosis of osteoporosis or at high risk for fracture should have regular bone mineral density tests.? Patients eligible for Medicare are allowed routine testing every 2 years.? The testing frequency can be increased to one year for patients who have rapidly progressing disease, are receiving or discontinuing medical therapy to restore bone mass, or have additional risk factors. I have reviewed this study and agree with the findings. [facility] FRAX* 10-year Probability of Fracture Based on femoral neck BMD: DualFemur (Right) Major Osteoporotic Fracture: 16.2% Hip Fracture:                5.2% Population:                  [place] (Caucasian) Risk Factors:                Secondary Osteoporosis *FRAX is a [place] [employer] for Metabolic Bone Disease, a [place] (WHO) [facility]. ASSESSMENT: The probability of a major osteoporotic fracture is 16.2% within the next ten years. The probability of a hip fracture is 5.2% within the next ten years. Electronically Signed   By: Elmer Picker M.D.   On: 10/16/2021 10:56   DG HIP PORT UNILAT WITH PELVIS 1V RIGHT  Result Date: 11/08/2021 CLINICAL DATA:  Right hip arthroplasty postoperative film. EXAM: DG HIP (WITH OR WITHOUT PELVIS) 1V PORT RIGHT COMPARISON:  Right hip x-ray 11/07/2021. FINDINGS: There is a new right hip arthroplasty in anatomic alignment. There is no fracture. There is right hip soft tissue air compatible with recent surgery. There are surgical clips in the pelvis. IMPRESSION: 1. New right hip arthroplasty in anatomic alignment. Electronically Signed   By: Ronney Asters M.D.   On: 11/08/2021 18:35   DG Hip Unilat With  Pelvis 2-3 Views Right  Result Date: 11/07/2021 CLINICAL DATA:  Fall, right hip pain EXAM: DG HIP (WITH OR WITHOUT PELVIS) 2-3V RIGHT COMPARISON:  None. FINDINGS: There is an acute, impacted right subcapital femoral neck fracture with external rotation of the femoral shaft. The femoral head is still seated within the right acetabulum. There is right hip joint space narrowing in keeping with at least mild right hip degenerative arthritis. The visualized right hemipelvis is intact. Soft tissues are unremarkable. IMPRESSION: Acute, impacted right subcapital femoral neck fracture. Electronically Signed   By: Fidela Salisbury M.D.   On: 11/07/2021 22:04    Disposition: Discharge disposition: 01-Home or Self Care       Discharge Instructions     Call MD / Call 911   Complete by: As directed    If you experience chest pain or shortness of breath, CALL 911 and be transported to the hospital emergency room.  If you develope a fever above 101 F, pus (white drainage) or increased drainage or redness at the wound, or calf pain, call your surgeon's office.   Constipation Prevention   Complete by: As directed    Drink plenty of fluids.  Prune juice may be helpful.  You may use a stool softener, such as Colace (over the counter) 100 mg twice a day.  Use MiraLax (over the counter) for constipation as needed.   Diet - low sodium heart healthy   Complete by: As directed    Discharge instructions   Complete by: As directed    INSTRUCTIONS AFTER JOINT REPLACEMENT   Remove items at home which could result in a fall. This includes throw rugs or furniture in walking pathways You may notice swelling that will progress down to the foot and ankle.  This is normal after surgery.  Elevate your leg when you are not up walking on it.   Continue to use the breathing machine you got in the hospital (incentive spirometer) which will help keep your temperature down.  It is common for your temperature to cycle up and down  following surgery, especially at night when you are not up moving around and exerting yourself.  The breathing machine keeps your lungs expanded and your temperature down.   DIET:  As you were doing prior to hospitalization, we recommend a well-balanced diet.  DRESSING / WOUND CARE / SHOWERING  Keep the surgical dressing until follow up.  The dressing is water proof, so you can shower without any extra covering.  IF THE DRESSING FALLS OFF or the wound gets wet inside, change the dressing with sterile gauze.  Please use good hand washing techniques before changing the dressing.  Do not use any lotions or creams on the incision until instructed by your surgeon.    ACTIVITY  Increase activity slowly as tolerated, but follow the weight bearing instructions below.   No driving for 6 weeks or until further direction given by your physician.  You cannot drive while taking narcotics.  No lifting or  carrying greater than 10 lbs. until further directed by your surgeon. Avoid periods of inactivity such as sitting longer than an hour when not asleep. This helps prevent blood clots.  You may return to work once you are authorized by your doctor.     WEIGHT BEARING   Weight bearing as tolerated with assist device (walker, cane, etc) as directed, use it as long as suggested by your surgeon or therapist, typically at least 2-4 weeks.   EXERCISES  Results after joint replacement surgery are often greatly improved when you follow the exercise, range of motion and muscle strengthening exercises prescribed by your doctor. Safety measures are also important to protect the joint from further injury. Any time any of these exercises cause you to have increased pain or swelling, decrease what you are doing until you are comfortable again and then slowly increase them. If you have problems or questions, call your caregiver or physical therapist for advice.   Rehabilitation is important following a joint  replacement. After just a few days of immobilization, the muscles of the leg can become weakened and shrink (atrophy).  These exercises are designed to build up the tone and strength of the thigh and leg muscles and to improve motion. Often times heat used for twenty to thirty minutes before working out will loosen up your tissues and help with improving the range of motion but do not use heat for the first two weeks following surgery (sometimes heat can increase post-operative swelling).   These exercises can be done on a training (exercise) mat, on the floor, on a table or on a bed. Use whatever works the best and is most comfortable for you.    Use music or television while you are exercising so that the exercises are a pleasant break in your day. This will make your life better with the exercises acting as a break in your routine that you can look forward to.   Perform all exercises about fifteen times, three times per day or as directed.  You should exercise both the operative leg and the other leg as well.  Exercises include:   Quad Sets - Tighten up the muscle on the front of the thigh (Quad) and hold for 5-10 seconds.   Straight Leg Raises - With your knee straight (if you were given a brace, keep it on), lift the leg to 60 degrees, hold for 3 seconds, and slowly lower the leg.  Perform this exercise against resistance later as your leg gets stronger.  Leg Slides: Lying on your back, slowly slide your foot toward your buttocks, bending your knee up off the floor (only go as far as is comfortable). Then slowly slide your foot back down until your leg is flat on the floor again.  Angel Wings: Lying on your back spread your legs to the side as far apart as you can without causing discomfort.  Hamstring Strength:  Lying on your back, push your heel against the floor with your leg straight by tightening up the muscles of your buttocks.  Repeat, but this time bend your knee to a comfortable angle, and  push your heel against the floor.  You may put a pillow under the heel to make it more comfortable if necessary.   A rehabilitation program following joint replacement surgery can speed recovery and prevent re-injury in the future due to weakened muscles. Contact your doctor or a physical therapist for more information on knee rehabilitation.    CONSTIPATION  Constipation is defined medically as fewer than three stools per week and severe constipation as less than one stool per week.  Even if you have a regular bowel pattern at home, your normal regimen is likely to be disrupted due to multiple reasons following surgery.  Combination of anesthesia, postoperative narcotics, change in appetite and fluid intake all can affect your bowels.   YOU MUST use at least one of the following options; they are listed in order of increasing strength to get the job done.  They are all available over the counter, and you may need to use some, POSSIBLY even all of these options:    Drink plenty of fluids (prune juice may be helpful) and high fiber foods Colace 100 mg by mouth twice a day  Senokot for constipation as directed and as needed Dulcolax (bisacodyl), take with full glass of water  Miralax (polyethylene glycol) once or twice a day as needed.  If you have tried all these things and are unable to have a bowel movement in the first 3-4 days after surgery call either your surgeon or your primary doctor.    If you experience loose stools or diarrhea, hold the medications until you stool forms back up.  If your symptoms do not get better within 1 week or if they get worse, check with your doctor.  If you experience "the worst abdominal pain ever" or develop nausea or vomiting, please contact the office immediately for further recommendations for treatment.   ITCHING:  If you experience itching with your medications, try taking only a single pain pill, or even half a pain pill at a time.  You can also use  Benadryl over the counter for itching or also to help with sleep.   TED HOSE STOCKINGS:  Use stockings on both legs until for at least 2 weeks or as directed by physician office. They may be removed at night for sleeping.  MEDICATIONS:  See your medication summary on the "After Visit Summary" that nursing will review with you.  You may have some home medications which will be placed on hold until you complete the course of blood thinner medication.  It is important for you to complete the blood thinner medication as prescribed.  PRECAUTIONS:  If you experience chest pain or shortness of breath - call 911 immediately for transfer to the hospital emergency department.   If you develop a fever greater that 101 F, purulent drainage from wound, increased redness or drainage from wound, foul odor from the wound/dressing, or calf pain - CONTACT YOUR SURGEON.                                                   FOLLOW-UP APPOINTMENTS:  If you do not already have a post-op appointment, please call the office for an appointment to be seen by your surgeon.  Guidelines for how soon to be seen are listed in your "After Visit Summary", but are typically between 1-4 weeks after surgery.  OTHER INSTRUCTIONS:  POST-OPERATIVE OPIOID TAPER INSTRUCTIONS: It is important to wean off of your opioid medication as soon as possible. If you do not need pain medication after your surgery it is ok to stop day one. Opioids include: Codeine, Hydrocodone(Norco, Vicodin), Oxycodone(Percocet, oxycontin) and hydromorphone amongst others.  Long term and even short term use of opiods can  cause: Increased pain response Dependence Constipation Depression Respiratory depression And more.  Withdrawal symptoms can include Flu like symptoms Nausea, vomiting And more Techniques to manage these symptoms Hydrate well Eat regular healthy meals Stay active Use relaxation techniques(deep breathing, meditating, yoga) Do Not  substitute Alcohol to help with tapering If you have been on opioids for less than two weeks and do not have pain than it is ok to stop all together.  Plan to wean off of opioids This plan should start within one week post op of your joint replacement. Maintain the same interval or time between taking each dose and first decrease the dose.  Cut the total daily intake of opioids by one tablet each day Next start to increase the time between doses. The last dose that should be eliminated is the evening dose.     MAKE SURE YOU:  Understand these instructions.  Get help right away if you are not doing well or get worse.    Thank you for letting us be a part of your medical care team.  It is a privilege we respect greatly.  We hope these instructions will help you stay on track for a fast and full recovery!   Follow the hip precautions as taught in Physical Therapy   Complete by: As directed    Increase activity slowly as tolerated   Complete by: As directed    Patient may shower   Complete by: As directed    Aquacel dressing is water proof    Wash over it and the whole leg with soap and water at the end of your shower   Post-operative opioid taper instructions:   Complete by: As directed    POST-OPERATIVE OPIOID TAPER INSTRUCTIONS: It is important to wean off of your opioid medication as soon as possible. If you do not need pain medication after your surgery it is ok to stop day one. Opioids include: Codeine, Hydrocodone(Norco, Vicodin), Oxycodone(Percocet, oxycontin) and hydromorphone amongst others.  Long term and even short term use of opiods can cause: Increased pain response Dependence Constipation Depression Respiratory depression And more.  Withdrawal symptoms can include Flu like symptoms Nausea, vomiting And more Techniques to manage these symptoms Hydrate well Eat regular healthy meals Stay active Use relaxation techniques(deep breathing, meditating, yoga) Do Not  substitute Alcohol to help with tapering If you have been on opioids for less than two weeks and do not have pain than it is ok to stop all together.  Plan to wean off of opioids This plan should start within one week post op of your joint replacement. Maintain the same interval or time between taking each dose and first decrease the dose.  Cut the total daily intake of opioids by one tablet each day Next start to increase the time between doses. The last dose that should be eliminated is the evening dose.      TED hose   Complete by: As directed    Use stockings (TED hose) for 5 weeks on B leg(s).  You may remove them at night for sleeping.        Follow-up Information     [facility] Follow up.   Specialty: Home Health Services Why: Agency now goes by the name [facility] - the office will call you to schedule home health visits Contact information: [address] [place] [address] [phone]                  Signed: Dellis Anes  J Madhuri Vacca 11/10/2021, 2:18 PM

## 2021-11-10 NOTE — Progress Notes (Signed)
Discharge summary provided to pt with instructions. Pt verbalized understanding of instructions. Wound care management discussed with no complaints.Pt d/c to home with [facility] services as ordered. Pt remains alert/oriented in no apparent distress. No complaints. Pt's spouse is responsible for her ride.

## 2021-11-11 NOTE — Progress Notes (Signed)
Patient was discharged yesterday by orthopedics.  She has hypertension and takes metoprolol and HCTZ at home.  HCT was held in the hospital due to soft blood pressure.  I called and discussed with patient to hold HCTZ for next 2 to 3 days and start taking it if the systolic blood pressure goes more than 150 mmHg.  I also told her to call to discuss the BP meds.

## 2021-11-11 NOTE — Transfer of Care (Signed)
Immediate Anesthesia Transfer of Care Note  Patient: Kayla Baker  Procedure(s) Performed: ARTHROPLASTY BIPOLAR HIP (HEMIARTHROPLASTY) POSTERIOR (Right: Hip)  Patient Location: PACU  Anesthesia Type:General  Level of Consciousness: awake, alert  and oriented  Airway & Oxygen Therapy: Patient Spontanous Breathing  Post-op Assessment: Report given to RN and Post -op Vital signs reviewed and stable  Post vital signs: Reviewed and stable  Last Vitals:  Vitals Value Taken Time  BP 138/67 11/10/21 0755  Temp 36.8 C 11/10/21 0755  Pulse 92 11/10/21 0755  Resp 18 11/10/21 0755  SpO2 95 % 11/10/21 0755    Last Pain:  Vitals:   11/10/21 1103  TempSrc:   PainSc: 0-No pain      Patients Stated Pain Goal: 3 (60/47/99 8721)  Complications: No notable events documented.

## 2021-11-12 ENCOUNTER — Encounter (HOSPITAL_COMMUNITY): Payer: Self-pay | Admitting: Orthopedic Surgery

## 2021-11-12 DIAGNOSIS — Z853 Personal history of malignant neoplasm of breast: Secondary | ICD-10-CM | POA: Diagnosis not present

## 2021-11-12 DIAGNOSIS — D630 Anemia in neoplastic disease: Secondary | ICD-10-CM | POA: Diagnosis not present

## 2021-11-12 DIAGNOSIS — Z923 Personal history of irradiation: Secondary | ICD-10-CM | POA: Diagnosis not present

## 2021-11-12 DIAGNOSIS — T451X5D Adverse effect of antineoplastic and immunosuppressive drugs, subsequent encounter: Secondary | ICD-10-CM | POA: Diagnosis not present

## 2021-11-12 DIAGNOSIS — C569 Malignant neoplasm of unspecified ovary: Secondary | ICD-10-CM | POA: Diagnosis not present

## 2021-11-12 DIAGNOSIS — E1122 Type 2 diabetes mellitus with diabetic chronic kidney disease: Secondary | ICD-10-CM | POA: Diagnosis not present

## 2021-11-12 DIAGNOSIS — S72041D Displaced fracture of base of neck of right femur, subsequent encounter for closed fracture with routine healing: Secondary | ICD-10-CM | POA: Diagnosis not present

## 2021-11-12 DIAGNOSIS — I10 Essential (primary) hypertension: Secondary | ICD-10-CM | POA: Diagnosis not present

## 2021-11-12 DIAGNOSIS — Z9221 Personal history of antineoplastic chemotherapy: Secondary | ICD-10-CM | POA: Diagnosis not present

## 2021-11-12 DIAGNOSIS — N183 Chronic kidney disease, stage 3 unspecified: Secondary | ICD-10-CM | POA: Diagnosis not present

## 2021-11-12 LAB — TYPE AND SCREEN
ABO/RH(D): A POS
Antibody Screen: NEGATIVE
Unit division: 0
Unit division: 0

## 2021-11-12 LAB — BPAM RBC
Blood Product Expiration Date: 202212312359
Blood Product Expiration Date: 202212312359
ISSUE DATE / TIME: 202212091537
ISSUE DATE / TIME: 202212091537
Unit Type and Rh: 6200
Unit Type and Rh: 6200

## 2021-11-12 LAB — SURGICAL PATHOLOGY

## 2021-11-18 ENCOUNTER — Inpatient Hospital Stay: Payer: PPO

## 2021-11-21 DIAGNOSIS — S72001D Fracture of unspecified part of neck of right femur, subsequent encounter for closed fracture with routine healing: Secondary | ICD-10-CM | POA: Diagnosis not present

## 2021-11-27 ENCOUNTER — Other Ambulatory Visit: Payer: Self-pay

## 2021-11-27 ENCOUNTER — Encounter (HOSPITAL_COMMUNITY): Payer: Self-pay

## 2021-11-27 ENCOUNTER — Inpatient Hospital Stay: Payer: PPO

## 2021-11-27 ENCOUNTER — Ambulatory Visit (HOSPITAL_COMMUNITY)
Admission: RE | Admit: 2021-11-27 | Discharge: 2021-11-27 | Disposition: A | Discharge status: Home / Self Care | Payer: PPO | Source: Ambulatory Visit | Attending: Hematology and Oncology | Admitting: Hematology and Oncology

## 2021-11-27 DIAGNOSIS — K668 Other specified disorders of peritoneum: Secondary | ICD-10-CM | POA: Diagnosis not present

## 2021-11-27 DIAGNOSIS — I700 Atherosclerosis of aorta: Secondary | ICD-10-CM | POA: Diagnosis not present

## 2021-11-27 DIAGNOSIS — J984 Other disorders of lung: Secondary | ICD-10-CM | POA: Diagnosis not present

## 2021-11-27 DIAGNOSIS — C569 Malignant neoplasm of unspecified ovary: Secondary | ICD-10-CM | POA: Insufficient documentation

## 2021-11-27 DIAGNOSIS — R161 Splenomegaly, not elsewhere classified: Secondary | ICD-10-CM | POA: Diagnosis not present

## 2021-11-27 DIAGNOSIS — Z95828 Presence of other vascular implants and grafts: Secondary | ICD-10-CM

## 2021-11-27 DIAGNOSIS — Z96641 Presence of right artificial hip joint: Secondary | ICD-10-CM | POA: Insufficient documentation

## 2021-11-27 DIAGNOSIS — R590 Localized enlarged lymph nodes: Secondary | ICD-10-CM | POA: Diagnosis not present

## 2021-11-27 DIAGNOSIS — C50212 Malignant neoplasm of upper-inner quadrant of left female breast: Secondary | ICD-10-CM

## 2021-11-27 LAB — CBC WITH DIFFERENTIAL (CANCER CENTER ONLY)
Abs Immature Granulocytes: 0.02 10*3/uL (ref 0.00–0.07)
Basophils Absolute: 0 10*3/uL (ref 0.0–0.1)
Basophils Relative: 1 %
Eosinophils Absolute: 0.1 10*3/uL (ref 0.0–0.5)
Eosinophils Relative: 2 %
HCT: 27.1 % — ABNORMAL LOW (ref 36.0–46.0)
Hemoglobin: 8.8 g/dL — ABNORMAL LOW (ref 12.0–15.0)
Immature Granulocytes: 1 %
Lymphocytes Relative: 33 %
Lymphs Abs: 1.1 10*3/uL (ref 0.7–4.0)
MCH: 31.1 pg (ref 26.0–34.0)
MCHC: 32.5 g/dL (ref 30.0–36.0)
MCV: 95.8 fL (ref 80.0–100.0)
Monocytes Absolute: 0.5 10*3/uL (ref 0.1–1.0)
Monocytes Relative: 16 %
Neutro Abs: 1.6 10*3/uL — ABNORMAL LOW (ref 1.7–7.7)
Neutrophils Relative %: 47 %
Platelet Count: 147 10*3/uL — ABNORMAL LOW (ref 150–400)
RBC: 2.83 MIL/uL — ABNORMAL LOW (ref 3.87–5.11)
RDW: 19.8 % — ABNORMAL HIGH (ref 11.5–15.5)
WBC Count: 3.3 10*3/uL — ABNORMAL LOW (ref 4.0–10.5)
nRBC: 0 % (ref 0.0–0.2)

## 2021-11-27 LAB — CMP (CANCER CENTER ONLY)
ALT: 24 U/L (ref 0–44)
AST: 35 U/L (ref 15–41)
Albumin: 3.6 g/dL (ref 3.5–5.0)
Alkaline Phosphatase: 173 U/L — ABNORMAL HIGH (ref 38–126)
Anion gap: 8 (ref 5–15)
BUN: 24 mg/dL — ABNORMAL HIGH (ref 8–23)
CO2: 29 mmol/L (ref 22–32)
Calcium: 10 mg/dL (ref 8.9–10.3)
Chloride: 106 mmol/L (ref 98–111)
Creatinine: 1.24 mg/dL — ABNORMAL HIGH (ref 0.44–1.00)
GFR, Estimated: 43 mL/min — ABNORMAL LOW (ref >60)
Glucose, Bld: 115 mg/dL — ABNORMAL HIGH (ref 70–99)
Potassium: 3.8 mmol/L (ref 3.5–5.1)
Sodium: 143 mmol/L (ref 135–145)
Total Bilirubin: 0.6 mg/dL (ref 0.3–1.2)
Total Protein: 7.1 g/dL (ref 6.5–8.1)

## 2021-11-27 MED ORDER — HEPARIN SOD (PORK) LOCK FLUSH 100 UNIT/ML IV SOLN
500.0000 [IU] | Freq: Once | INTRAVENOUS | Status: AC
  Administered 2021-11-27: 10:00:00 500 [IU] via INTRAVENOUS

## 2021-11-27 MED ORDER — IOHEXOL 350 MG/ML SOLN
60.0000 mL | Freq: Once | INTRAVENOUS | Status: AC | PRN
  Administered 2021-11-27: 10:00:00 60 mL via INTRAVENOUS

## 2021-11-27 MED ORDER — SODIUM CHLORIDE 0.9% FLUSH
10.0000 mL | Freq: Once | INTRAVENOUS | Status: AC
  Administered 2021-11-27: 09:00:00 10 mL

## 2021-11-27 MED ORDER — HEPARIN SOD (PORK) LOCK FLUSH 100 UNIT/ML IV SOLN
INTRAVENOUS | Status: AC
  Filled 2021-11-27: qty 5

## 2021-11-27 MED ORDER — SODIUM CHLORIDE (PF) 0.9 % IJ SOLN
INTRAMUSCULAR | Status: AC
  Filled 2021-11-27: qty 50

## 2021-11-29 ENCOUNTER — Encounter: Payer: Self-pay | Admitting: Hematology and Oncology

## 2021-11-29 ENCOUNTER — Other Ambulatory Visit: Payer: Self-pay

## 2021-11-29 ENCOUNTER — Other Ambulatory Visit: Payer: Self-pay | Admitting: Hematology and Oncology

## 2021-11-29 ENCOUNTER — Inpatient Hospital Stay: Payer: PPO | Admitting: Hematology and Oncology

## 2021-11-29 ENCOUNTER — Inpatient Hospital Stay: Payer: PPO

## 2021-11-29 DIAGNOSIS — Z5111 Encounter for antineoplastic chemotherapy: Secondary | ICD-10-CM | POA: Diagnosis not present

## 2021-11-29 DIAGNOSIS — D61818 Other pancytopenia: Secondary | ICD-10-CM | POA: Diagnosis not present

## 2021-11-29 DIAGNOSIS — C561 Malignant neoplasm of right ovary: Secondary | ICD-10-CM | POA: Insufficient documentation

## 2021-11-29 DIAGNOSIS — C50212 Malignant neoplasm of upper-inner quadrant of left female breast: Secondary | ICD-10-CM

## 2021-11-29 DIAGNOSIS — E118 Type 2 diabetes mellitus with unspecified complications: Secondary | ICD-10-CM | POA: Diagnosis not present

## 2021-11-29 DIAGNOSIS — C569 Malignant neoplasm of unspecified ovary: Secondary | ICD-10-CM

## 2021-11-29 DIAGNOSIS — S72001D Fracture of unspecified part of neck of right femur, subsequent encounter for closed fracture with routine healing: Secondary | ICD-10-CM | POA: Diagnosis not present

## 2021-11-29 DIAGNOSIS — N183 Chronic kidney disease, stage 3 unspecified: Secondary | ICD-10-CM | POA: Diagnosis not present

## 2021-11-29 DIAGNOSIS — Z79899 Other long term (current) drug therapy: Secondary | ICD-10-CM | POA: Insufficient documentation

## 2021-11-29 DIAGNOSIS — N1831 Chronic kidney disease, stage 3a: Secondary | ICD-10-CM | POA: Diagnosis not present

## 2021-11-29 DIAGNOSIS — E1122 Type 2 diabetes mellitus with diabetic chronic kidney disease: Secondary | ICD-10-CM | POA: Diagnosis not present

## 2021-11-29 DIAGNOSIS — Z170 Estrogen receptor positive status [ER+]: Secondary | ICD-10-CM

## 2021-11-29 MED ORDER — SODIUM CHLORIDE 0.9% FLUSH
10.0000 mL | INTRAVENOUS | Status: DC | PRN
  Administered 2021-11-29: 17:00:00 10 mL

## 2021-11-29 MED ORDER — FAMOTIDINE 20 MG IN NS 100 ML IVPB
20.0000 mg | Freq: Once | INTRAVENOUS | Status: AC
  Administered 2021-11-29: 14:00:00 20 mg via INTRAVENOUS
  Filled 2021-11-29: qty 100

## 2021-11-29 MED ORDER — SODIUM CHLORIDE 0.9 % IV SOLN
500.0000 mg/m2 | Freq: Once | INTRAVENOUS | Status: AC
  Administered 2021-11-29: 15:00:00 836 mg via INTRAVENOUS
  Filled 2021-11-29: qty 21.99

## 2021-11-29 MED ORDER — SODIUM CHLORIDE 0.9 % IV SOLN
10.0000 mg | Freq: Once | INTRAVENOUS | Status: AC
  Administered 2021-11-29: 14:00:00 10 mg via INTRAVENOUS
  Filled 2021-11-29: qty 10

## 2021-11-29 MED ORDER — SODIUM CHLORIDE 0.9 % IV SOLN
150.0000 mg | Freq: Once | INTRAVENOUS | Status: AC
  Administered 2021-11-29: 14:00:00 150 mg via INTRAVENOUS
  Filled 2021-11-29: qty 150

## 2021-11-29 MED ORDER — DIPHENHYDRAMINE HCL 50 MG/ML IJ SOLN
25.0000 mg | Freq: Once | INTRAMUSCULAR | Status: AC
  Administered 2021-11-29: 14:00:00 25 mg via INTRAVENOUS
  Filled 2021-11-29: qty 1

## 2021-11-29 MED ORDER — SODIUM CHLORIDE 0.9 % IV SOLN
238.8000 mg | Freq: Once | INTRAVENOUS | Status: AC
  Administered 2021-11-29: 16:00:00 240 mg via INTRAVENOUS
  Filled 2021-11-29: qty 24

## 2021-11-29 MED ORDER — PALONOSETRON HCL INJECTION 0.25 MG/5ML
0.2500 mg | Freq: Once | INTRAVENOUS | Status: AC
  Administered 2021-11-29: 14:00:00 0.25 mg via INTRAVENOUS
  Filled 2021-11-29: qty 5

## 2021-11-29 MED ORDER — SODIUM CHLORIDE 0.9 % IV SOLN: Freq: Once | INTRAVENOUS | Status: AC

## 2021-11-29 MED ORDER — HEPARIN SOD (PORK) LOCK FLUSH 100 UNIT/ML IV SOLN
500.0000 [IU] | Freq: Once | INTRAVENOUS | Status: AC | PRN
  Administered 2021-11-29: 17:00:00 500 [IU]

## 2021-11-29 NOTE — Progress Notes (Signed)
[facility] OFFICE PROGRESS NOTE  Patient Care Team: [name], MD as PCP - Kinnie Scales, OD as Referring Physician (Optometry) [name], MD as Consulting Physician (Dermatology) Latanya Maudlin, MD as Consulting Physician (Orthopedic Surgery) Inda Castle, MD (Inactive) as Consulting Physician (Gastroenterology) Jacqulyn Liner, RN as Oncology Nurse Navigator (Oncology) Heath Lark, MD as Consulting Physician (Hematology and Oncology) Madelon Lips, MD as Consulting Physician (Nephrology) Rush Landmark, [facility] as Pharmacist (Pharmacist)  ASSESSMENT & PLAN:  Ovarian cancer [facility]) I have reviewed multiple imaging studies with the patient and her husband She has positive response to treatment Due to history of severe pancytopenia, we have made informed decision to space out her treatment to every 3 to 4 weeks and for her to receive carboplatin and Gemzar together and omit day 8 Gemzar I recommend we continue this treatment for another 3 months with plan to repeat imaging study in March  Pancytopenia, acquired [facility]) She has prolonged pancytopenia after each cycle of treatment She does not need transfusion support  CKD stage 3 due to type 2 diabetes mellitus ([place]) She has stable chronic kidney disease stage III Observe closely Will adjust the dose of carboplatin accordingly  No orders of the defined types were placed in this encounter.   All questions were answered. The patient knows to call the clinic with any problems, questions or concerns. The total time spent in the appointment was 30 minutes encounter with patients including review of chart and various tests results, discussions about plan of care and coordination of care plan   Heath Lark, MD 11/29/2021 5:40 PM  INTERVAL HISTORY: Please see below for problem oriented charting. she returns for treatment follow-up with her husband She is on carboplatin and gemcitabine for recurrent ovarian  cancer Since last time I saw her, she fell at home and had to have hip surgery I have reviewed her chart extensively Her sister passed away from lung cancer She had a tough time around the holidays Overall, she tolerated recent treatment well  REVIEW OF SYSTEMS:   Constitutional: Denies fevers, chills or abnormal weight loss Eyes: Denies blurriness of vision Ears, nose, mouth, throat, and face: Denies mucositis or sore throat Respiratory: Denies cough, dyspnea or wheezes Cardiovascular: Denies palpitation, chest discomfort or lower extremity swelling Gastrointestinal:  Denies nausea, heartburn or change in bowel habits Skin: Denies abnormal skin rashes Lymphatics: Denies new lymphadenopathy or easy bruising Neurological:Denies numbness, tingling or new weaknesses Behavioral/Psych: Mood is stable, no new changes  All other systems were reviewed with the patient and are negative.  I have reviewed the past medical history, past surgical history, social history and family history with the patient and they are unchanged from previous note.  ALLERGIES:  is allergic to keflex [cephalexin], meloxicam, prednisone, premarin [estrogens conjugated], and trovan [alatrofloxacin].  MEDICATIONS:  Current Outpatient Medications  Medication Sig Dispense Refill   acetaminophen (TYLENOL) 325 MG tablet Take 325 mg by mouth every 6 (six) hours as needed for headache.     allopurinol (ZYLOPRIM) 300 MG tablet TAKE 1 TABLET BY MOUTH DAILY FOR GOUT PREVENTION (Patient taking differently: Take 150 mg by mouth daily. FOR GOUT PREVENTION,) 90 tablet 1   blood glucose meter kit and supplies KIT Dispense based on patient and insurance preference. Use to check blood sugar once daily. Dx: E11.22, N18.30 1 each 12   Cholecalciferol (VITAMIN D3) 5000 units CAPS Take 5,000 Units by mouth daily.     cyanocobalamin 1000 MCG tablet Take 1,000  mcg by mouth daily.     enoxaparin (LOVENOX) 40 MG/0.4ML injection Inject 0.4 mLs  (40 mg total) into the skin daily. 30 mL 0   ezetimibe (ZETIA) 10 MG tablet Patient knows to take by mouth  !   - Thanks (Patient taking differently: Take 10 mg by mouth daily.) 90 tablet 3   gabapentin (NEURONTIN) 300 MG capsule Take 1 capsule in the morning and afternoon and 2 capsules at night 360 capsule 3   glimepiride (AMARYL) 1 MG tablet Take 1 tab by mouth in the morning if fasting is running 150+. Please do not take this medication if you are ill or not eating as this can cause a low blood sugar. (Patient taking differently: Take 1 mg by mouth See admin instructions. Take 1 tab by mouth daily as needed if fasting is running 150+. Please do not take this medication if you are ill or not eating as this can cause a low blood sugar.) 90 tablet 1   glucose blood (FREESTYLE TEST STRIPS) test strip USE TO CHECK BLOOD GLUCOSE ONCE DAILY 100 strip 3   hydrochlorothiazide (HYDRODIURIL) 25 MG tablet TAKE ONE TABLET BY MOUTH DAILY FOR FOR BLOOD PRESSURE AND FLUID RETENTION/ ANKLE SWELLING (Patient taking differently: Take 25 mg by mouth daily. TAKE ONE TABLET BY MOUTH DAILY FOR FOR BLOOD PRESSURE AND FLUID RETENTION/ ANKLE SWELLING) 90 tablet 1   HYDROcodone-acetaminophen (NORCO/VICODIN) 5-325 MG tablet Take 1-2 tablets by mouth every 6 (six) hours as needed for moderate pain. 30 tablet 0   Lancets (FREESTYLE) lancets CHECK BLOOD SUGAR DAILY AS DIRECTED 100 each 3   lidocaine-prilocaine (EMLA) cream Apply to affected area once (Patient taking differently: 1 application once as needed (for port access).) 30 g 3   loratadine (CLARITIN) 10 MG tablet Take 10 mg by mouth daily as needed for allergies.     Magnesium 250 MG TABS Take 250 mg by mouth every other day.     metoprolol succinate (TOPROL-XL) 25 MG 24 hr tablet Take  1 tablet  Daily  for BP       /TAKE 1 TABLET BY MOUTH DAILY FOR BLOOD PRESSURE (Patient taking differently: Take 25 mg by mouth daily.) 90 tablet 3   mupirocin ointment (BACTROBAN) 2 % Place  1 application into the nose 2 (two) times daily. 22 g 0   ondansetron (ZOFRAN) 8 MG tablet Take 1 tablet (8 mg total) by mouth 2 (two) times daily as needed for refractory nausea / vomiting. Start on day 3 after carboplatin chemo. (Patient not taking: Reported on 11/08/2021) 30 tablet 1   PFIZER-BIONT COVID-19 VAC-TRIS SUSP injection      polyethylene glycol (MIRALAX) 17 g packet Take 17 g by mouth 2 (two) times daily. 17 grams in 6 oz of favorite drink twice a day until bowel movement.  LAXITIVE.  Restart if two days since last bowel movement 14 packet 0   prochlorperazine (COMPAZINE) 10 MG tablet Take 1 tablet (10 mg total) by mouth every 6 (six) hours as needed for nausea or vomiting. (Patient not taking: Reported on 11/08/2021) 60 tablet 9   rosuvastatin (CRESTOR) 5 MG tablet Take 1 tablet (5 mg total) by mouth 3 (three) times a week. In the evening for cholesterol. (Patient taking differently: Take 5 mg by mouth every Monday, Wednesday, and Friday. In the evening for cholesterol.) 36 tablet 3   trolamine salicylate (ASPERCREME) 10 % cream Apply 1 application topically as needed for muscle pain (Use if you still  feel tingles in feet in the morning after taking Gabapentin).     vitamin C (ASCORBIC ACID) 500 MG tablet Take 500 mg by mouth every evening.      No current facility-administered medications for this visit.   Facility-Administered Medications Ordered in Other Visits  Medication Dose Route Frequency Provider Last Rate Last Admin   sodium chloride flush (NS) 0.9 % injection 10 mL  10 mL Intracatheter PRN Heath Lark, MD   10 mL at 11/29/21 1632    SUMMARY OF ONCOLOGIC HISTORY: Oncology History Overview Note  HRD positive   Breast cancer of upper-inner quadrant of left female breast ([place])  02/26/2016 Initial Diagnosis   Left breast biopsy 11:00 position: invasive ductal carcinoma with DCIS, ER 90%, PR 10%, HER-2 negative, Ki-67 30%, grade 2, 2.2 cm palpable lesion T2 N0 stage II a  clinical stage   03/18/2016 Surgery   Left lumpectomy: Invasive ductal carcinoma, grade 2, 6.3 cm, with high-grade DCIS, margins negative, 0/4 lymph nodes negative, ER 90%, via 10%, HER-2 negative ratio 0.97, Ki-67 30%, T3 N0 stage IIB   03/25/2016 Procedure   Genetic testing is negative for pathogenic mutations within any of the 20 Genes on the breast/ovarian cancer panel   04/04/2016 Oncotype testing   Oncotype DX recurrence score 37, 25% 10 year distant risk of recurrence   04/17/2016 - 07/31/2016 Chemotherapy   Adjuvant chemotherapy with dose dense Adriamycin and Cytoxan followed by Abraxane weekly 8 ( discontinued due to neuropathy)   09/01/2016 - 09/26/2016 Radiation Therapy   Adj XRT 1) Left breast: 42.5 Gy in 17 fractions. 2) Left breast boost: 7.5 Gy in 3 fractions.   12/02/2016 -  Anti-estrogen oral therapy   Anastrozole 1 mg daily   09/05/2021 -  Chemotherapy   Patient is on Treatment Plan : OVARIAN RECURRENT 3RD LINE Carboplatin D1 / Gemcitabine D1,8 (4/800) q21d     Ovarian cancer ([place])  04/03/2020 Imaging   US pelvis Complex cystic and solid mass in LEFT adnexa 12.5 cm diameter question cystic ovarian neoplasm; recommend correlation with serum tumor markers and further evaluation by MR imaging with and without contrast.     04/04/2020 Imaging   US venous Doppler No evidence of deep venous thrombosis in the right lower extremity. Left common femoral vein also patent.   04/15/2020 Imaging   MRI pelvis 1. Large complex solid and cystic mass arising from the right adnexa measuring 10.8 by 11.5 by 11.1 cm. This has an aggressive appearance with extensive enhancing mural soft tissue components. Findings are highly suspicious for malignant ovarian neoplasm. 2. Extensive bilateral retroperitoneal and bilateral iliac adenopathy compatible with metastatic disease. 3. Signs of extensive peritoneal carcinomatosis including ascites, enhancement, thickening and nodularity of the peritoneal  reflections, omental caking and bulky peritoneal nodularity. 4. Suspected serosal involvement of the dome of bladder with loss of normal fat plane. Cannot rule out mural invasion by tumor.   04/17/2020 Tumor Marker   Patient's tumor was tested for the following markers: CA-125 Results of the tumor marker test revealed 1762.   04/20/2020 Cancer Staging   Staging form: Ovary, Fallopian Tube, and Primary Peritoneal Carcinoma, AJCC 8th Edition - Clinical: FIGO Stage IIIC (cT3, cN1, cM0) - Signed by Heath Lark, MD on 04/20/2020    04/23/2020 Imaging   1. Redemonstrated dominant mixed solid and cystic mass arising from the vicinity of the right ovary measuring at least 11.5 x 10.7 cm, not significantly changed compared to prior MR, and consistent with primary ovarian  malignancy.  2. Numerous bulky retroperitoneal, bilateral iliac, and pelvic sidewall lymph nodes. 3. Moderate volume ascites throughout the abdomen and pelvis with subtle thickening and nodularity throughout the peritoneum, and extensive bulky nodular metastatic disease of the omentum. 4. Constellation of findings is consistent with advanced nodal and peritoneal metastatic disease. 5. There are prominent subcentimeter epicardial lymph nodes, nonspecific although suspicious for nodal metastatic disease. No definite nodal metastatic disease in the chest. Attention on follow-up. 6. There are multiple small subpleural nodules at the right lung base overlying the diaphragm, measuring up to 7 mm. These are generally nonspecific and less favored to represent pulmonary metastatic disease given distribution. Attention on follow-up. 7. Somewhat coarse contour of the liver, suggestive of cirrhosis, although out without overt morphologic stigmata. 8. Aortic Atherosclerosis (ICD10-I70.0).     04/24/2020 Procedure   Successful placement of a right internal jugular approach power injectable Port-A-Cath. The catheter is ready for immediate use.      05/02/2020 Tumor Marker   Patient's tumor was tested for the following markers: CA-125 Results of the tumor marker test revealed 1921   05/03/2020 - 12/12/2020 Chemotherapy   The patient had carboplatin and taxol for chemotherapy treatment.     05/08/2020 Procedure   Successful ultrasound-guided paracentesis yielding 2.6 liters of peritoneal fluid.   05/16/2020 Procedure   Successful ultrasound-guided paracentesis yielding 3.2 liters of peritoneal fluid   05/25/2020 Procedure   Successful ultrasound-guided paracentesis yielding 3 liters of peritoneal fluid.     06/01/2020 Procedure   Successful ultrasound-guided therapeutic paracentesis yielding 1.7 liters of peritoneal fluid.   06/14/2020 Tumor Marker   Patient's tumor was tested for the following markers: CA-125 Results of the tumor marker test revealed 1309   06/20/2020 Imaging   1. Dominant mixed cystic and solid mass in the central pelvis is stable in the interval. 2. Clear interval decrease in retroperitoneal and pelvic sidewall lymphadenopathy. The bulky omental disease has also clearly decreased in the interval. 3. Interval decrease in ascites. 4. Stable 8 mm subpleural nodule along the diaphragm. 5. Aortic Atherosclerosis (ICD10-I70.0).   07/10/2020 Tumor Marker   Patient's tumor was tested for the following markers: CA-125 Results of the tumor marker test revealed 220   08/03/2020 Tumor Marker   Patient's tumor was tested for the following markers: CA-125 Results of the tumor marker test revealed 53.3.   09/03/2020 Tumor Marker   Patient's tumor was tested for the following markers: CA-125 Results of the tumor marker test revealed 29.7   09/20/2020 Imaging   1. Substantial improvement. The right ovarian cystic and solid mass is half the volume that it measured on 06/20/2020. Similar reduction in the bulk of the omental caking of tumor. Prior ascites is resolved and the retroperitoneal adenopathy is markedly improved. 2. Other  imaging findings of potential clinical significance: Stable pleural-based nodularity along the right hemidiaphragm measuring about 1.1 by 0.7 by 0.3 cm. Stable hypodense lesion of the right kidney upper pole, probably a cyst although the configuration of this lesion makes it difficult to obtain accurate density measurements. Left ovarian cyst, stable. Chondrocalcinosis involving the acetabular labra. Mild lumbar spondylosis and degenerative disc disease. 3. Aortic atherosclerosis.     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 (stage ypT3c), predominantly viable with focal fibrosis and hemosiderin laden macrophages (possible mild treatment effect)  B: Ovary and fallopian tube, right, salpingo-oophorectomy - High grade serous carcinoma involving right ovary and fallopian tube with surface involvement, size up to 8 cm  - Areas of necrosis and hemosiderin laden macrophages suggestive of treatment effect - 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 - See synoptic report and comment  C: Ovary and fallopian tube, left, salpingo-oophorectomy - High grade serous carcinoma involving the left fallopian tube, size up to 0.5 cm - Ovary with no definite involvement by carcinoma identified - Nodular area of endometriosis and peritoneal inclusion cyst also present - See synoptic report and comment   Procedure:    Bilateral salpingo-oophorectomy    Procedure:    Omentectomy    Specimen Integrity of Right Ovary:    Intact with surface involvement by tumor    Specimen Integrity of Left Ovary:    Capsule intact   TUMOR Tumor Site:    Right fallopian tube: favor as primary site; also involves right ovary and left fallopian tube  Histologic  Type:    Serous carcinoma  Histologic Grade:    High grade  Tumor Size:    Greatest Dimension (Centimeters): in fallopian tube: 1.0 cm; in ovary: 8.0 cm Ovarian Surface Involvement:    Present    Laterality:    Right  Fallopian Tube Surface Involvement:    Present    Laterality:    Bilateral  Other Tissue / Organ Involvement:    Right ovary  Other Tissue / Organ Involvement:    Right fallopian tube  Other Tissue / Organ Involvement:    Left fallopian tube  Other Tissue / Organ Involvement:    Omentum  Largest Extrapelvic Peritoneal Focus:    Macroscopic (greater than 2 cm)  Peritoneal / Ascitic Fluid:    Not submitted / unknown  Pleural Fluid:    Not submitted / unknown  Treatment Effect:    No definite or minimal response identified (chemotherapy response score [CRS] 1)   LYMPH NODES Regional Lymph Nodes:    No lymph nodes submitted or found   PATHOLOGIC STAGE CLASSIFICATION (pTNM, AJCC 8th Edition) TNM Descriptors:    y (post-treatment)  Primary Tumor (pT):    pT3c  Regional Lymph Nodes (pN):    pNX   FIGO STAGE FIGO Stage:    IIIC   ADDITIONAL FINDINGS Additional Findings:    Serous tubal intraepithelial carcinoma (STIC)  Additional Findings:    Left ovary with no definite carcinoma identified, left-sided nodule of endometriosis and peritoneal inclusion cyst    10/18/2020 - 10/20/2020 Hospital Admission   She was admitted to [facility] for interval debulking surgery   11/19/2020 Tumor Marker   Patient's tumor was tested for the following markers: CA-125. Results of the tumor marker test revealed 38.3   12/12/2020 Tumor Marker   Patient's tumor was tested for the following markers CA-125. Results of the tumor marker test revealed 42.3.   01/02/2021 Tumor Marker   Patient's tumor was tested for the following markers: CA-125. Results of the tumor marker test revealed 37.3   01/29/2021 Tumor Marker   Patient's tumor was tested for the following markers: CA-125 Results of  the tumor marker test revealed 44.3   01/29/2021 Imaging   1. Interval increase in loculated appearing ascites in the low central abdomen and pelvis. 2. There is extensive peritoneal nodularity surrounding this fluid, the nodularity itself not appreciably changed in appearance compared to prior examination. 3. Additional peritoneal nodularity and/or mesenteric lymph nodes are  unchanged. 4. Interval decrease in size of a low-attenuation nodule overlying the right hemidiaphragm, now measuring 1.2 cm, previously 1.8 cm when measured similarly. Findings are consistent with treatment response of a metastatic nodule. No other evidence of intrathoracic metastatic disease. 5. Status post hysterectomy, oophorectomy, and omentectomy.   01/31/2021 - 08/26/2021 Chemotherapy   She is started on Olaparib       03/04/2021 Tumor Marker   Patient's tumor was tested for the following markers: CA-125 Results of the tumor marker test revealed 39.7   04/01/2021 Tumor Marker   Patient's tumor was tested for the following markers: CA-125 Results of the tumor marker test revealed 32.3   05/10/2021 Imaging   1. No significant change in peritoneal carcinomatosis since previous study of 3 months ago, primarily in the pelvis where there is complex fluid and peritoneal nodularity. 2. No evidence of solid visceral organ metastasis. No evidence of bowel or ureteral obstruction. 3.  Aortic Atherosclerosis (ICD10-I70.0).   06/13/2021 Tumor Marker   Patient's tumor was tested for the following markers: CA-125. Results of the tumor marker test revealed 48.3.   07/29/2021 Tumor Marker   Patient's tumor was tested for the following markers: CA-125. Results of the tumor marker test revealed 54.   08/25/2021 Imaging   1. There is extensive peritoneal soft tissue thickening and nodularity, particularly about the low right hemipelvis, which is similar to prior examination. Some small peritoneal nodules throughout the abdomen and  pelvis are slightly enlarged, other nodules are unchanged. 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 2. No significant change in a subpleural nodule overlying the right hemidiaphragm. 3. Unchanged, loculated small volume pelvic ascites. 4. Status post hysterectomy, oophorectomy, and omentectomy. 5. Coronary artery disease.   Aortic Atherosclerosis (ICD10-I70.0).     08/26/2021 Tumor Marker   Patient's tumor was tested for the following markers: CA-125. Results of the tumor marker test revealed 56.   09/04/2021 Tumor Marker   Patient's tumor was tested for the following markers: CA-125. Results of the tumor marker test revealed 55.4.   09/05/2021 -  Chemotherapy   Patient is on Treatment Plan : OVARIAN RECURRENT 3RD LINE Carboplatin D1 / Gemcitabine D1,8 (4/800) q21d     10/07/2021 Tumor Marker   Patient's tumor was tested for the following markers: CA-125. Results of the tumor marker test revealed 51.9.   11/05/2021 Tumor Marker   Patient's tumor was tested for the following markers: CA-125. Results of the tumor marker test revealed 45.6.   11/27/2021 Imaging   1. Overall stable to slightly improved ill-defined soft tissue density throughout the pelvis. 2. Slight interval decrease in size of the omental lesions. 3. Stable free pelvic fluid. 4. New surgical changes from a right hip replacement. 5. New nodular and somewhat triangular density at the left lung base is likely an area of atelectasis. 6. Aortic atherosclerosis.     PHYSICAL EXAMINATION: ECOG PERFORMANCE STATUS: 2 - Symptomatic, <50% confined to bed  Vitals:   11/29/21 1227  BP: 129/64  Pulse: 89  Resp: 18  Temp: (!) 97.4 F (36.3 C)  SpO2: 99%   Filed Weights   11/29/21 1227  Weight: 142 lb 3.2 oz (64.5 kg)    GENERAL:alert, no distress and comfortable   NEURO: alert & oriented x 3 with fluent speech, no focal motor/sensory deficits  LABORATORY DATA:  I  have reviewed the data as listed    Component Value Date/Time   NA 143 11/27/2021 0845   NA  141 11/06/2016 1102   K 3.8 11/27/2021 0845   K 3.9 11/06/2016 1102   CL 106 11/27/2021 0845   CO2 29 11/27/2021 0845   CO2 27 11/06/2016 1102   GLUCOSE 115 (H) 11/27/2021 0845   GLUCOSE 130 11/06/2016 1102   BUN 24 (H) 11/27/2021 0845   BUN 18.0 11/06/2016 1102   CREATININE 1.24 (H) 11/27/2021 0845   CREATININE 1.42 (H) 02/21/2021 1414   CREATININE 1.3 (H) 11/06/2016 1102   CALCIUM 10.0 11/27/2021 0845   CALCIUM 10.2 11/06/2016 1102   PROT 7.1 11/27/2021 0845   PROT 7.1 11/06/2016 1102   ALBUMIN 3.6 11/27/2021 0845   ALBUMIN 3.7 11/06/2016 1102   AST 35 11/27/2021 0845   AST 52 (H) 11/06/2016 1102   ALT 24 11/27/2021 0845   ALT 58 (H) 11/06/2016 1102   ALKPHOS 173 (H) 11/27/2021 0845   ALKPHOS 159 (H) 11/06/2016 1102   BILITOT 0.6 11/27/2021 0845   BILITOT 0.53 11/06/2016 1102   GFRNONAA 43 (L) 11/27/2021 0845   GFRNONAA 34 (L) 02/21/2021 1414   GFRAA 40 (L) 02/21/2021 1414    No results found for: SPEP, UPEP  Lab Results  Component Value Date   WBC 3.3 (L) 11/27/2021   NEUTROABS 1.6 (L) 11/27/2021   HGB 8.8 (L) 11/27/2021   HCT 27.1 (L) 11/27/2021   MCV 95.8 11/27/2021   PLT 147 (L) 11/27/2021      Chemistry      Component Value Date/Time   NA 143 11/27/2021 0845   NA 141 11/06/2016 1102   K 3.8 11/27/2021 0845   K 3.9 11/06/2016 1102   CL 106 11/27/2021 0845   CO2 29 11/27/2021 0845   CO2 27 11/06/2016 1102   BUN 24 (H) 11/27/2021 0845   BUN 18.0 11/06/2016 1102   CREATININE 1.24 (H) 11/27/2021 0845   CREATININE 1.42 (H) 02/21/2021 1414   CREATININE 1.3 (H) 11/06/2016 1102      Component Value Date/Time   CALCIUM 10.0 11/27/2021 0845   CALCIUM 10.2 11/06/2016 1102   ALKPHOS 173 (H) 11/27/2021 0845   ALKPHOS 159 (H) 11/06/2016 1102   AST 35 11/27/2021 0845   AST 52 (H) 11/06/2016 1102   ALT 24 11/27/2021 0845   ALT 58 (H) 11/06/2016 1102   BILITOT 0.6  11/27/2021 0845   BILITOT 0.53 11/06/2016 1102       RADIOGRAPHIC STUDIES: I have reviewed multiple imaging studies with the patient and her husband I have personally reviewed the radiological images as listed and agreed with the findings in the report. DG Chest 1 View  Result Date: 11/07/2021 CLINICAL DATA:  Right hip fracture, medical clearance EXAM: CHEST  1 VIEW COMPARISON:  None. FINDINGS: The lungs are symmetrically well expanded. There has developed mild diffuse interstitial infiltrate most in keeping with mild interstitial pulmonary edema. No pneumothorax or pleural effusion. Previously noted right subclavian chest port has been removed and new right internal jugular chest port tip is seen within the right atrium. Cardiac size is within normal limits. Surgical clips are seen within the left breast. No acute bone abnormality. IMPRESSION: Mild interstitial pulmonary edema. Electronically Signed   By: Fidela Salisbury M.D.   On: 11/07/2021 22:03   CT ABDOMEN PELVIS W CONTRAST  Result Date: 11/27/2021 CLINICAL DATA:  Follow-up ovarian cancer. EXAM: CT ABDOMEN AND PELVIS WITH CONTRAST TECHNIQUE: Multidetector CT imaging of the abdomen and pelvis was performed using the standard protocol following bolus administration of intravenous contrast. CONTRAST:  33m OMNIPAQUE  IOHEXOL 350 MG/ML SOLN COMPARISON:  Multiple prior CT scans.  The most recent is 08/23/2021 FINDINGS: Lower chest: The heart is normal in size. No pericardial effusion. Stable aortic calcifications. New nodular and somewhat triangular density at the left lung base is likely an area of atelectasis. No pulmonary nodules or pleural effusions. Hepatobiliary: No hepatic lesions or intrahepatic biliary dilatation. The gallbladder is unremarkable. No common bile duct dilatation. Pancreas: No mass, inflammation or ductal dilatation. Spleen: Borderline splenic enlargement is stable. No splenic lesions. Adrenals/Urinary Tract: The adrenal glands  and kidneys are unremarkable and stable. Bladder is grossly normal. Stomach/Bowel: Stomach, duodenum, small bowel and colon are grossly normal. Vascular/Lymphatic: The aorta and branch vessels are patent. The major venous structures are patent. Small upper abdominal lymph nodes are unchanged. Few small scattered retroperitoneal lymph nodes are also stable. Reproductive: Surgically absent. Other: Persistent ill-defined soft tissue density throughout the pelvis surrounding the dome region of the bladder and along the right-side of the sigmoid colon. Overall this appears stable but is difficult to measure exactly. The AP length of a soft tissue density on image 61/2 measures 4 cm and previously measured 3.9 cm. Slightly more superiorly soft tissue density on image 59/2 measures 3.2 cm and previously measured 3.4 cm. The more masslike area soft tissue density just above the bladder and anterior to the rectosigmoid area on image number 70/2 measures 4 x 3.2 cm. This previously measured the 3.7 x 3.2 cm. Small nodule in the right pericolic gutter on image 75/1 measures 8 mm and previously measured 9.5 mm. Small nodule anterior to ascending colon on image 41/2 measures 8 mm and previously measured 10.5 mm. Small nodule in the left pelvis medial to the upper sigmoid colon on image 57/2 measures 7 mm and previously measured 11 mm. Small left-sided omental lesions have decreased in size slightly also. Free pelvic fluid appears overall stable. Musculoskeletal: No significant bony findings. New surgical changes from a right hip replacement. IMPRESSION: 1. Overall stable to slightly improved ill-defined soft tissue density throughout the pelvis. 2. Slight interval decrease in size of the omental lesions. 3. Stable free pelvic fluid. 4. New surgical changes from a right hip replacement. 5. New nodular and somewhat triangular density at the left lung base is likely an area of atelectasis. 6. Aortic atherosclerosis. Aortic  Atherosclerosis (ICD10-I70.0). Electronically Signed   By: Marijo Sanes M.D.   On: 11/27/2021 21:30   DG HIP PORT UNILAT WITH PELVIS 1V RIGHT  Result Date: 11/08/2021 CLINICAL DATA:  Right hip arthroplasty postoperative film. EXAM: DG HIP (WITH OR WITHOUT PELVIS) 1V PORT RIGHT COMPARISON:  Right hip x-ray 11/07/2021. FINDINGS: There is a new right hip arthroplasty in anatomic alignment. There is no fracture. There is right hip soft tissue air compatible with recent surgery. There are surgical clips in the pelvis. IMPRESSION: 1. New right hip arthroplasty in anatomic alignment. Electronically Signed   By: Ronney Asters M.D.   On: 11/08/2021 18:35   DG Hip Unilat With Pelvis 2-3 Views Right  Result Date: 11/07/2021 CLINICAL DATA:  Fall, right hip pain EXAM: DG HIP (WITH OR WITHOUT PELVIS) 2-3V RIGHT COMPARISON:  None. FINDINGS: There is an acute, impacted right subcapital femoral neck fracture with external rotation of the femoral shaft. The femoral head is still seated within the right acetabulum. There is right hip joint space narrowing in keeping with at least mild right hip degenerative arthritis. The visualized right hemipelvis is intact. Soft tissues are unremarkable. IMPRESSION: Acute, impacted right  subcapital femoral neck fracture. Electronically Signed   By: Fidela Salisbury M.D.   On: 11/07/2021 22:04

## 2021-11-29 NOTE — Assessment & Plan Note (Signed)
She has stable chronic kidney disease stage III Observe closely Will adjust the dose of carboplatin accordingly

## 2021-11-29 NOTE — Patient Instructions (Signed)
[facility]   Discharge Instructions: Thank you for choosing [facility] to provide your oncology and hematology care.   If you have a lab appointment with the [facility], please go directly to the [facility] and check in at the registration area.   Wear comfortable clothing and clothing appropriate for easy access to any Portacath or PICC line.   We strive to give you quality time with your provider. You may need to reschedule your appointment if you arrive late (15 or more minutes).  Arriving late affects you and other patients whose appointments are after yours.  Also, if you miss three or more appointments without notifying the office, you may be dismissed from the clinic at the providers discretion.      For prescription refill requests, have your pharmacy contact our office and allow 72 hours for refills to be completed.    Today you received the following chemotherapy and/or immunotherapy agents: gemcitabine and carboplatin.      To help prevent nausea and vomiting after your treatment, we encourage you to take your nausea medication as directed.  BELOW ARE SYMPTOMS THAT SHOULD BE REPORTED IMMEDIATELY: *FEVER GREATER THAN 100.4 F (38 C) OR HIGHER *CHILLS OR SWEATING *NAUSEA AND VOMITING THAT IS NOT CONTROLLED WITH YOUR NAUSEA MEDICATION *UNUSUAL SHORTNESS OF BREATH *UNUSUAL BRUISING OR BLEEDING *URINARY PROBLEMS (pain or burning when urinating, or frequent urination) *BOWEL PROBLEMS (unusual diarrhea, constipation, pain near the anus) TENDERNESS IN MOUTH AND THROAT WITH OR WITHOUT PRESENCE OF ULCERS (sore throat, sores in mouth, or a toothache) UNUSUAL RASH, SWELLING OR PAIN  UNUSUAL VAGINAL DISCHARGE OR ITCHING   Items with * indicate a potential emergency and should be followed up as soon as possible or go to the Emergency Department if any problems should occur.  Please show the CHEMOTHERAPY ALERT CARD or IMMUNOTHERAPY ALERT  CARD at check-in to the Emergency Department and triage nurse.  Should you have questions after your visit or need to cancel or reschedule your appointment, please contact [facility]  Dept: [phone]  and follow the prompts.  Office hours are 8:00 a.m. to 4:30 p.m. Monday - Friday. Please note that voicemails left after 4:00 p.m. may not be returned until the following business day.  We are closed weekends and major holidays. You have access to a nurse at all times for urgent questions. Please call the main number to the clinic Dept: [phone] and follow the prompts.   For any non-urgent questions, you may also contact your provider using MyChart. We now offer e-Visits for anyone 61 and older to request care online for non-urgent symptoms. For details visit [email or web address].   Also download the MyChart app! Go to the app store, search "MyChart", open the app, select [facility], and log in with your MyChart username and password.  Due to Covid, a mask is required upon entering the hospital/clinic. If you do not have a mask, one will be given to you upon arrival. For doctor visits, patients may have 1 support person aged 18 or older with them. For treatment visits, patients cannot have anyone with them due to current Covid guidelines and our immunocompromised population.

## 2021-11-29 NOTE — Assessment & Plan Note (Signed)
I have reviewed multiple imaging studies with the patient and her husband She has positive response to treatment Due to history of severe pancytopenia, we have made informed decision to space out her treatment to every 3 to 4 weeks and for her to receive carboplatin and Gemzar together and omit day 8 Gemzar I recommend we continue this treatment for another 3 months with plan to repeat imaging study in March

## 2021-11-29 NOTE — Assessment & Plan Note (Signed)
She has prolonged pancytopenia after each cycle of treatment She does not need transfusion support

## 2021-12-01 ENCOUNTER — Other Ambulatory Visit: Payer: Self-pay | Admitting: Internal Medicine

## 2021-12-01 DIAGNOSIS — E1140 Type 2 diabetes mellitus with diabetic neuropathy, unspecified: Secondary | ICD-10-CM

## 2021-12-01 MED ORDER — GABAPENTIN 300 MG PO CAPS: ORAL_CAPSULE | ORAL | 3 refills | Status: DC

## 2021-12-03 DIAGNOSIS — D630 Anemia in neoplastic disease: Secondary | ICD-10-CM | POA: Diagnosis not present

## 2021-12-03 DIAGNOSIS — Z853 Personal history of malignant neoplasm of breast: Secondary | ICD-10-CM | POA: Diagnosis not present

## 2021-12-03 DIAGNOSIS — Z923 Personal history of irradiation: Secondary | ICD-10-CM | POA: Diagnosis not present

## 2021-12-03 DIAGNOSIS — N183 Chronic kidney disease, stage 3 unspecified: Secondary | ICD-10-CM | POA: Diagnosis not present

## 2021-12-03 DIAGNOSIS — Z9221 Personal history of antineoplastic chemotherapy: Secondary | ICD-10-CM | POA: Diagnosis not present

## 2021-12-03 DIAGNOSIS — S72041D Displaced fracture of base of neck of right femur, subsequent encounter for closed fracture with routine healing: Secondary | ICD-10-CM | POA: Diagnosis not present

## 2021-12-03 DIAGNOSIS — T451X5D Adverse effect of antineoplastic and immunosuppressive drugs, subsequent encounter: Secondary | ICD-10-CM | POA: Diagnosis not present

## 2021-12-03 DIAGNOSIS — C569 Malignant neoplasm of unspecified ovary: Secondary | ICD-10-CM | POA: Diagnosis not present

## 2021-12-03 DIAGNOSIS — I10 Essential (primary) hypertension: Secondary | ICD-10-CM | POA: Diagnosis not present

## 2021-12-03 DIAGNOSIS — E1122 Type 2 diabetes mellitus with diabetic chronic kidney disease: Secondary | ICD-10-CM | POA: Diagnosis not present

## 2021-12-04 ENCOUNTER — Telehealth: Payer: Self-pay | Admitting: Hematology and Oncology

## 2021-12-04 NOTE — Telephone Encounter (Signed)
Patient will be mailed updated calendar.

## 2021-12-06 ENCOUNTER — Other Ambulatory Visit: Payer: Self-pay

## 2021-12-06 MED ORDER — HYDROCHLOROTHIAZIDE 25 MG PO TABS: ORAL_TABLET | ORAL | 1 refills | Status: DC

## 2021-12-07 ENCOUNTER — Other Ambulatory Visit: Payer: Self-pay | Admitting: Internal Medicine

## 2021-12-07 DIAGNOSIS — E1140 Type 2 diabetes mellitus with diabetic neuropathy, unspecified: Secondary | ICD-10-CM

## 2021-12-07 MED ORDER — GABAPENTIN 300 MG PO CAPS: ORAL_CAPSULE | ORAL | 3 refills | Status: DC

## 2021-12-19 ENCOUNTER — Encounter: Payer: Self-pay | Admitting: Adult Health

## 2021-12-19 DIAGNOSIS — S72001D Fracture of unspecified part of neck of right femur, subsequent encounter for closed fracture with routine healing: Secondary | ICD-10-CM | POA: Diagnosis not present

## 2021-12-21 ENCOUNTER — Other Ambulatory Visit: Payer: PPO

## 2021-12-23 MED FILL — Dexamethasone Sodium Phosphate Inj 100 MG/10ML: INTRAMUSCULAR | Qty: 1 | Status: AC

## 2021-12-23 MED FILL — Fosaprepitant Dimeglumine For IV Infusion 150 MG (Base Eq): INTRAVENOUS | Qty: 5 | Status: AC

## 2021-12-24 ENCOUNTER — Inpatient Hospital Stay: Payer: PPO

## 2021-12-24 ENCOUNTER — Inpatient Hospital Stay: Payer: PPO | Attending: Gynecologic Oncology | Admitting: Hematology and Oncology

## 2021-12-24 ENCOUNTER — Other Ambulatory Visit: Payer: Self-pay

## 2021-12-24 ENCOUNTER — Encounter: Payer: Self-pay | Admitting: Hematology and Oncology

## 2021-12-24 DIAGNOSIS — N183 Chronic kidney disease, stage 3 unspecified: Secondary | ICD-10-CM | POA: Diagnosis not present

## 2021-12-24 DIAGNOSIS — E1140 Type 2 diabetes mellitus with diabetic neuropathy, unspecified: Secondary | ICD-10-CM | POA: Diagnosis not present

## 2021-12-24 DIAGNOSIS — E1122 Type 2 diabetes mellitus with diabetic chronic kidney disease: Secondary | ICD-10-CM | POA: Diagnosis not present

## 2021-12-24 DIAGNOSIS — Z5111 Encounter for antineoplastic chemotherapy: Secondary | ICD-10-CM | POA: Insufficient documentation

## 2021-12-24 DIAGNOSIS — Z90721 Acquired absence of ovaries, unilateral: Secondary | ICD-10-CM | POA: Insufficient documentation

## 2021-12-24 DIAGNOSIS — Z79899 Other long term (current) drug therapy: Secondary | ICD-10-CM | POA: Diagnosis not present

## 2021-12-24 DIAGNOSIS — C786 Secondary malignant neoplasm of retroperitoneum and peritoneum: Secondary | ICD-10-CM | POA: Diagnosis not present

## 2021-12-24 DIAGNOSIS — C569 Malignant neoplasm of unspecified ovary: Secondary | ICD-10-CM

## 2021-12-24 DIAGNOSIS — C561 Malignant neoplasm of right ovary: Secondary | ICD-10-CM | POA: Diagnosis not present

## 2021-12-24 DIAGNOSIS — Z170 Estrogen receptor positive status [ER+]: Secondary | ICD-10-CM

## 2021-12-24 DIAGNOSIS — C50212 Malignant neoplasm of upper-inner quadrant of left female breast: Secondary | ICD-10-CM

## 2021-12-24 DIAGNOSIS — D61818 Other pancytopenia: Secondary | ICD-10-CM | POA: Diagnosis not present

## 2021-12-24 DIAGNOSIS — Z95828 Presence of other vascular implants and grafts: Secondary | ICD-10-CM

## 2021-12-24 LAB — CMP (CANCER CENTER ONLY)
ALT: 17 U/L (ref 0–44)
AST: 33 U/L (ref 15–41)
Albumin: 3.7 g/dL (ref 3.5–5.0)
Alkaline Phosphatase: 140 U/L — ABNORMAL HIGH (ref 38–126)
Anion gap: 8 (ref 5–15)
BUN: 19 mg/dL (ref 8–23)
CO2: 27 mmol/L (ref 22–32)
Calcium: 9.9 mg/dL (ref 8.9–10.3)
Chloride: 108 mmol/L (ref 98–111)
Creatinine: 1.19 mg/dL — ABNORMAL HIGH (ref 0.44–1.00)
GFR, Estimated: 45 mL/min — ABNORMAL LOW (ref >60)
Glucose, Bld: 97 mg/dL (ref 70–99)
Potassium: 3.6 mmol/L (ref 3.5–5.1)
Sodium: 143 mmol/L (ref 135–145)
Total Bilirubin: 0.6 mg/dL (ref 0.3–1.2)
Total Protein: 7 g/dL (ref 6.5–8.1)

## 2021-12-24 LAB — CBC WITH DIFFERENTIAL (CANCER CENTER ONLY)
Abs Immature Granulocytes: 0.01 10*3/uL (ref 0.00–0.07)
Basophils Absolute: 0 10*3/uL (ref 0.0–0.1)
Basophils Relative: 0 %
Eosinophils Absolute: 0 10*3/uL (ref 0.0–0.5)
Eosinophils Relative: 1 %
HCT: 27.2 % — ABNORMAL LOW (ref 36.0–46.0)
Hemoglobin: 8.7 g/dL — ABNORMAL LOW (ref 12.0–15.0)
Immature Granulocytes: 0 %
Lymphocytes Relative: 36 %
Lymphs Abs: 1 10*3/uL (ref 0.7–4.0)
MCH: 31.1 pg (ref 26.0–34.0)
MCHC: 32 g/dL (ref 30.0–36.0)
MCV: 97.1 fL (ref 80.0–100.0)
Monocytes Absolute: 0.6 10*3/uL (ref 0.1–1.0)
Monocytes Relative: 20 %
Neutro Abs: 1.2 10*3/uL — ABNORMAL LOW (ref 1.7–7.7)
Neutrophils Relative %: 43 %
Platelet Count: 146 10*3/uL — ABNORMAL LOW (ref 150–400)
RBC: 2.8 MIL/uL — ABNORMAL LOW (ref 3.87–5.11)
RDW: 19.9 % — ABNORMAL HIGH (ref 11.5–15.5)
WBC Count: 2.8 10*3/uL — ABNORMAL LOW (ref 4.0–10.5)
nRBC: 0 % (ref 0.0–0.2)

## 2021-12-24 MED ORDER — SODIUM CHLORIDE 0.9 % IV SOLN: Freq: Once | INTRAVENOUS | Status: AC

## 2021-12-24 MED ORDER — PALONOSETRON HCL INJECTION 0.25 MG/5ML
0.2500 mg | Freq: Once | INTRAVENOUS | Status: AC
  Administered 2021-12-24: 12:00:00 0.25 mg via INTRAVENOUS
  Filled 2021-12-24: qty 5

## 2021-12-24 MED ORDER — DIPHENHYDRAMINE HCL 50 MG/ML IJ SOLN
25.0000 mg | Freq: Once | INTRAMUSCULAR | Status: AC
  Administered 2021-12-24: 12:00:00 25 mg via INTRAVENOUS
  Filled 2021-12-24: qty 1

## 2021-12-24 MED ORDER — SODIUM CHLORIDE 0.9% FLUSH
10.0000 mL | Freq: Once | INTRAVENOUS | Status: AC
  Administered 2021-12-24: 11:00:00 10 mL

## 2021-12-24 MED ORDER — SODIUM CHLORIDE 0.9 % IV SOLN
500.0000 mg/m2 | Freq: Once | INTRAVENOUS | Status: AC
  Administered 2021-12-24: 13:00:00 836 mg via INTRAVENOUS
  Filled 2021-12-24: qty 21.99

## 2021-12-24 MED ORDER — HEPARIN SOD (PORK) LOCK FLUSH 100 UNIT/ML IV SOLN
500.0000 [IU] | Freq: Once | INTRAVENOUS | Status: AC | PRN
  Administered 2021-12-24: 15:00:00 500 [IU]

## 2021-12-24 MED ORDER — SODIUM CHLORIDE 0.9% FLUSH
10.0000 mL | INTRAVENOUS | Status: DC | PRN
  Administered 2021-12-24: 15:00:00 10 mL

## 2021-12-24 MED ORDER — SODIUM CHLORIDE 0.9 % IV SOLN
244.4000 mg | Freq: Once | INTRAVENOUS | Status: AC
  Administered 2021-12-24: 14:00:00 240 mg via INTRAVENOUS
  Filled 2021-12-24: qty 24

## 2021-12-24 MED ORDER — SODIUM CHLORIDE 0.9 % IV SOLN
10.0000 mg | Freq: Once | INTRAVENOUS | Status: AC
  Administered 2021-12-24: 12:00:00 10 mg via INTRAVENOUS
  Filled 2021-12-24: qty 10

## 2021-12-24 MED ORDER — SODIUM CHLORIDE 0.9 % IV SOLN
150.0000 mg | Freq: Once | INTRAVENOUS | Status: AC
  Administered 2021-12-24: 12:00:00 150 mg via INTRAVENOUS
  Filled 2021-12-24: qty 150

## 2021-12-24 MED ORDER — FAMOTIDINE 20 MG IN NS 100 ML IVPB
20.0000 mg | Freq: Once | INTRAVENOUS | Status: AC
  Administered 2021-12-24: 12:00:00 20 mg via INTRAVENOUS
  Filled 2021-12-24: qty 100

## 2021-12-24 NOTE — Progress Notes (Signed)
[place] OFFICE PROGRESS NOTE  Patient Care Team: [name], MD as PCP - Onalee Hua, Wille Glaser, OD as Referring Physician (Optometry) [name], MD as Consulting Physician (Dermatology) Latanya Maudlin, MD as Consulting Physician (Orthopedic Surgery) Inda Castle, MD (Inactive) as Consulting Physician (Gastroenterology) Jacqulyn Liner, RN as Oncology Nurse Navigator (Oncology) Heath Lark, MD as Consulting Physician (Hematology and Oncology) Madelon Lips, MD as Consulting Physician (Nephrology) Rush Landmark, [facility] as Pharmacist (Pharmacist)  ASSESSMENT & PLAN:  Ovarian cancer [facility]) Her last imaging studies showed positive response to treatment Due to history of severe pancytopenia, we have made informed decision to space out her treatment to every 4 weeks and for her to receive carboplatin and Gemzar together and omit day 8 Gemzar I recommend we continue this treatment for another few more months with plan to repeat imaging study in March  Pancytopenia, acquired [facility]) She has prolonged pancytopenia after each cycle of treatment She is mildly neutropenic today but does not need to delay treatment We discussed risk of infection and risk of bleeding with her low platelet count She does not need transfusion support Based on the trend of her CBC, I recommend spacing out her treatment to every 4 weeks and she is in agreement We will proceed with treatment without delay despite pancytopenia today  CKD stage 3 due to type 2 diabetes mellitus ([place]) She has stable chronic kidney disease stage III Observe closely Will adjust the dose of carboplatin accordingly  No orders of the defined types were placed in this encounter.   All questions were answered. The patient knows to call the clinic with any problems, questions or concerns. The total time spent in the appointment was 30 minutes encounter with patients including review of chart and various tests results,  discussions about plan of care and coordination of care plan   Heath Lark, MD 12/24/2021 1:15 PM  INTERVAL HISTORY: Please see below for problem oriented charting. she returns for treatment follow-up on carboplatin and gemcitabine for recurrent ovarian cancer Since last time I saw her, she is doing well No recent infection Denies recent bleeding Appetite is fair No recent abdominal pain, changes in bowel habits or bloating  REVIEW OF SYSTEMS:   Constitutional: Denies fevers, chills or abnormal weight loss Eyes: Denies blurriness of vision Ears, nose, mouth, throat, and face: Denies mucositis or sore throat Respiratory: Denies cough, dyspnea or wheezes Cardiovascular: Denies palpitation, chest discomfort or lower extremity swelling Gastrointestinal:  Denies nausea, heartburn or change in bowel habits Skin: Denies abnormal skin rashes Lymphatics: Denies new lymphadenopathy or easy bruising Neurological:Denies numbness, tingling or new weaknesses Behavioral/Psych: Mood is stable, no new changes  All other systems were reviewed with the patient and are negative.  I have reviewed the past medical history, past surgical history, social history and family history with the patient and they are unchanged from previous note.  ALLERGIES:  is allergic to keflex [cephalexin], meloxicam, prednisone, premarin [estrogens conjugated], and trovan [alatrofloxacin].  MEDICATIONS:  Current Outpatient Medications  Medication Sig Dispense Refill   acetaminophen (TYLENOL) 325 MG tablet Take 325 mg by mouth every 6 (six) hours as needed for headache.     allopurinol (ZYLOPRIM) 300 MG tablet TAKE 1 TABLET BY MOUTH DAILY FOR GOUT PREVENTION (Patient taking differently: Take 150 mg by mouth daily. FOR GOUT PREVENTION,) 90 tablet 1   blood glucose meter kit and supplies KIT Dispense based on patient and insurance preference. Use to check blood sugar once daily. Dx:  E11.22, N18.30 1 each 12   Cholecalciferol  (VITAMIN D3) 5000 units CAPS Take 5,000 Units by mouth daily.     cyanocobalamin 1000 MCG tablet Take 1,000 mcg by mouth daily.     ezetimibe (ZETIA) 10 MG tablet Patient knows to take by mouth  !   - Thanks (Patient taking differently: Take 10 mg by mouth daily.) 90 tablet 3   gabapentin (NEURONTIN) 300 MG capsule Take 1 capsule 4 x  /day for Pain 360 capsule 3   glimepiride (AMARYL) 1 MG tablet Take 1 tab by mouth in the morning if fasting is running 150+. Please do not take this medication if you are ill or not eating as this can cause a low blood sugar. (Patient taking differently: Take 1 mg by mouth See admin instructions. Take 1 tab by mouth daily as needed if fasting is running 150+. Please do not take this medication if you are ill or not eating as this can cause a low blood sugar.) 90 tablet 1   glucose blood (FREESTYLE TEST STRIPS) test strip USE TO CHECK BLOOD GLUCOSE ONCE DAILY 100 strip 3   hydrochlorothiazide (HYDRODIURIL) 25 MG tablet TAKE ONE TABLET BY MOUTH DAILY FOR FOR BLOOD PRESSURE AND FLUID RETENTION/ ANKLE SWELLING 90 tablet 1   HYDROcodone-acetaminophen (NORCO/VICODIN) 5-325 MG tablet Take 1-2 tablets by mouth every 6 (six) hours as needed for moderate pain. 30 tablet 0   Lancets (FREESTYLE) lancets CHECK BLOOD SUGAR DAILY AS DIRECTED 100 each 3   lidocaine-prilocaine (EMLA) cream Apply to affected area once (Patient taking differently: 1 application once as needed (for port access).) 30 g 3   loratadine (CLARITIN) 10 MG tablet Take 10 mg by mouth daily as needed for allergies.     Magnesium 250 MG TABS Take 250 mg by mouth every other day.     metoprolol succinate (TOPROL-XL) 25 MG 24 hr tablet Take  1 tablet  Daily  for BP       /TAKE 1 TABLET BY MOUTH DAILY FOR BLOOD PRESSURE (Patient taking differently: Take 25 mg by mouth daily.) 90 tablet 3   ondansetron (ZOFRAN) 8 MG tablet Take 1 tablet (8 mg total) by mouth 2 (two) times daily as needed for refractory nausea / vomiting.  Start on day 3 after carboplatin chemo. (Patient not taking: Reported on 11/08/2021) 30 tablet 1   PFIZER-BIONT COVID-19 VAC-TRIS SUSP injection      polyethylene glycol (MIRALAX) 17 g packet Take 17 g by mouth 2 (two) times daily. 17 grams in 6 oz of favorite drink twice a day until bowel movement.  LAXITIVE.  Restart if two days since last bowel movement 14 packet 0   prochlorperazine (COMPAZINE) 10 MG tablet Take 1 tablet (10 mg total) by mouth every 6 (six) hours as needed for nausea or vomiting. (Patient not taking: Reported on 11/08/2021) 60 tablet 9   rosuvastatin (CRESTOR) 5 MG tablet Take 1 tablet (5 mg total) by mouth 3 (three) times a week. In the evening for cholesterol. (Patient taking differently: Take 5 mg by mouth every Monday, Wednesday, and Friday. In the evening for cholesterol.) 36 tablet 3   trolamine salicylate (ASPERCREME) 10 % cream Apply 1 application topically as needed for muscle pain (Use if you still feel tingles in feet in the morning after taking Gabapentin).     vitamin C (ASCORBIC ACID) 500 MG tablet Take 500 mg by mouth every evening.      No current facility-administered medications for this  visit.   Facility-Administered Medications Ordered in Other Visits  Medication Dose Route Frequency Provider Last Rate Last Admin   CARBOplatin (PARAPLATIN) 240 mg in sodium chloride 0.9 % 100 mL chemo infusion  240 mg Intravenous Once [name], Caleyah Jr, MD       gemcitabine (GEMZAR) 836 mg in sodium chloride 0.9 % 250 mL chemo infusion  500 mg/m2 (Treatment Plan Recorded) Intravenous Once [name], Yoltzin Barg, MD       heparin lock flush 100 unit/mL  500 Units Intracatheter Once PRN [name], Alina Gilkey, MD       sodium chloride flush (NS) 0.9 % injection 10 mL  10 mL Intracatheter PRN Heath Lark, MD        SUMMARY OF ONCOLOGIC HISTORY: Oncology History Overview Note  HRD positive   History of left breast cancer  02/26/2016 Initial Diagnosis   Left breast biopsy 11:00 position: invasive ductal  carcinoma with DCIS, ER 90%, PR 10%, HER-2 negative, Ki-67 30%, grade 2, 2.2 cm palpable lesion T2 N0 stage II a clinical stage   03/18/2016 Surgery   Left lumpectomy: Invasive ductal carcinoma, grade 2, 6.3 cm, with high-grade DCIS, margins negative, 0/4 lymph nodes negative, ER 90%, via 10%, HER-2 negative ratio 0.97, Ki-67 30%, T3 N0 stage IIB   03/25/2016 Procedure   Genetic testing is negative for pathogenic mutations within any of the 20 Genes on the breast/ovarian cancer panel   04/04/2016 Oncotype testing   Oncotype DX recurrence score 37, 25% 10 year distant risk of recurrence   04/17/2016 - 07/31/2016 Chemotherapy   Adjuvant chemotherapy with dose dense Adriamycin and Cytoxan followed by Abraxane weekly 8 ( discontinued due to neuropathy)   09/01/2016 - 09/26/2016 Radiation Therapy   Adj XRT 1) Left breast: 42.5 Gy in 17 fractions. 2) Left breast boost: 7.5 Gy in 3 fractions.   12/02/2016 -  Anti-estrogen oral therapy   Anastrozole 1 mg daily   09/05/2021 -  Chemotherapy   Patient is on Treatment Plan : OVARIAN RECURRENT 3RD LINE Carboplatin D1 / Gemcitabine D1,8 (4/800) q21d     Ovarian cancer ([place])  04/03/2020 Imaging   US pelvis Complex cystic and solid mass in LEFT adnexa 12.5 cm diameter question cystic ovarian neoplasm; recommend correlation with serum tumor markers and further evaluation by MR imaging with and without contrast.     04/04/2020 Imaging   US venous Doppler No evidence of deep venous thrombosis in the right lower extremity. Left common femoral vein also patent.   04/15/2020 Imaging   MRI pelvis 1. Large complex solid and cystic mass arising from the right adnexa measuring 10.8 by 11.5 by 11.1 cm. This has an aggressive appearance with extensive enhancing mural soft tissue components. Findings are highly suspicious for malignant ovarian neoplasm. 2. Extensive bilateral retroperitoneal and bilateral iliac adenopathy compatible with metastatic disease. 3. Signs of  extensive peritoneal carcinomatosis including ascites, enhancement, thickening and nodularity of the peritoneal reflections, omental caking and bulky peritoneal nodularity. 4. Suspected serosal involvement of the dome of bladder with loss of normal fat plane. Cannot rule out mural invasion by tumor.   04/17/2020 Tumor Marker   Patient's tumor was tested for the following markers: CA-125 Results of the tumor marker test revealed 1762.   04/20/2020 Cancer Staging   Staging form: Ovary, Fallopian Tube, and Primary Peritoneal Carcinoma, AJCC 8th Edition - Clinical: FIGO Stage IIIC (cT3, cN1, cM0) - Signed by Heath Lark, MD on 04/20/2020    04/23/2020 Imaging   1. Redemonstrated dominant mixed solid  and cystic mass arising from the vicinity of the right ovary measuring at least 11.5 x 10.7 cm, not significantly changed compared to prior MR, and consistent with primary ovarian malignancy.  2. Numerous bulky retroperitoneal, bilateral iliac, and pelvic sidewall lymph nodes. 3. Moderate volume ascites throughout the abdomen and pelvis with subtle thickening and nodularity throughout the peritoneum, and extensive bulky nodular metastatic disease of the omentum. 4. Constellation of findings is consistent with advanced nodal and peritoneal metastatic disease. 5. There are prominent subcentimeter epicardial lymph nodes, nonspecific although suspicious for nodal metastatic disease. No definite nodal metastatic disease in the chest. Attention on follow-up. 6. There are multiple small subpleural nodules at the right lung base overlying the diaphragm, measuring up to 7 mm. These are generally nonspecific and less favored to represent pulmonary metastatic disease given distribution. Attention on follow-up. 7. Somewhat coarse contour of the liver, suggestive of cirrhosis, although out without overt morphologic stigmata. 8. Aortic Atherosclerosis (ICD10-I70.0).     04/24/2020 Procedure   Successful placement of a  right internal jugular approach power injectable Port-A-Cath. The catheter is ready for immediate use.     05/02/2020 Tumor Marker   Patient's tumor was tested for the following markers: CA-125 Results of the tumor marker test revealed 1921   05/03/2020 - 12/12/2020 Chemotherapy   The patient had carboplatin and taxol for chemotherapy treatment.     05/08/2020 Procedure   Successful ultrasound-guided paracentesis yielding 2.6 liters of peritoneal fluid.   05/16/2020 Procedure   Successful ultrasound-guided paracentesis yielding 3.2 liters of peritoneal fluid   05/25/2020 Procedure   Successful ultrasound-guided paracentesis yielding 3 liters of peritoneal fluid.     06/01/2020 Procedure   Successful ultrasound-guided therapeutic paracentesis yielding 1.7 liters of peritoneal fluid.   06/14/2020 Tumor Marker   Patient's tumor was tested for the following markers: CA-125 Results of the tumor marker test revealed 1309   06/20/2020 Imaging   1. Dominant mixed cystic and solid mass in the central pelvis is stable in the interval. 2. Clear interval decrease in retroperitoneal and pelvic sidewall lymphadenopathy. The bulky omental disease has also clearly decreased in the interval. 3. Interval decrease in ascites. 4. Stable 8 mm subpleural nodule along the diaphragm. 5. Aortic Atherosclerosis (ICD10-I70.0).   07/10/2020 Tumor Marker   Patient's tumor was tested for the following markers: CA-125 Results of the tumor marker test revealed 220   08/03/2020 Tumor Marker   Patient's tumor was tested for the following markers: CA-125 Results of the tumor marker test revealed 53.3.   09/03/2020 Tumor Marker   Patient's tumor was tested for the following markers: CA-125 Results of the tumor marker test revealed 29.7   09/20/2020 Imaging   1. Substantial improvement. The right ovarian cystic and solid mass is half the volume that it measured on 06/20/2020. Similar reduction in the bulk of the omental  caking of tumor. Prior ascites is resolved and the retroperitoneal adenopathy is markedly improved. 2. Other imaging findings of potential clinical significance: Stable pleural-based nodularity along the right hemidiaphragm measuring about 1.1 by 0.7 by 0.3 cm. Stable hypodense lesion of the right kidney upper pole, probably a cyst although the configuration of this lesion makes it difficult to obtain accurate density measurements. Left ovarian cyst, stable. Chondrocalcinosis involving the acetabular labra. Mild lumbar spondylosis and degenerative disc disease. 3. Aortic atherosclerosis.     10/18/2020 Surgery   Exploratory laparotomy, bilateral salpingo-oophorectomy, omentectomy, radical retroperitoneal dissection for tumor debulking   10/18/2020 Pathology Results   A:  Omentum, omentectomy - Metastatic high grade serous carcinoma, nodules measuring up to 2.7 cm (stage ypT3c), predominantly viable with focal fibrosis and hemosiderin laden macrophages (possible mild treatment effect)  B: Ovary and fallopian tube, right, salpingo-oophorectomy - High grade serous carcinoma involving right ovary and fallopian tube with surface involvement, size up to 8 cm  - Areas of necrosis and hemosiderin laden macrophages suggestive of treatment effect - 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 - See synoptic report and comment  C: Ovary and fallopian tube, left, salpingo-oophorectomy - High grade serous carcinoma involving the left fallopian tube, size up to 0.5 cm - Ovary with no definite involvement by carcinoma identified - Nodular area of endometriosis and peritoneal inclusion cyst also present - See synoptic report and comment   Procedure:    Bilateral salpingo-oophorectomy    Procedure:    Omentectomy    Specimen Integrity of Right Ovary:    Intact with surface involvement by tumor    Specimen Integrity of Left Ovary:    Capsule intact   TUMOR Tumor Site:     Right fallopian tube: favor as primary site; also involves right ovary and left fallopian tube  Histologic Type:    Serous carcinoma  Histologic Grade:    High grade  Tumor Size:    Greatest Dimension (Centimeters): in fallopian tube: 1.0 cm; in ovary: 8.0 cm Ovarian Surface Involvement:    Present    Laterality:    Right  Fallopian Tube Surface Involvement:    Present    Laterality:    Bilateral  Other Tissue / Organ Involvement:    Right ovary  Other Tissue / Organ Involvement:    Right fallopian tube  Other Tissue / Organ Involvement:    Left fallopian tube  Other Tissue / Organ Involvement:    Omentum  Largest Extrapelvic Peritoneal Focus:    Macroscopic (greater than 2 cm)  Peritoneal / Ascitic Fluid:    Not submitted / unknown  Pleural Fluid:    Not submitted / unknown  Treatment Effect:    No definite or minimal response identified (chemotherapy response score [CRS] 1)   LYMPH NODES Regional Lymph Nodes:    No lymph nodes submitted or found   PATHOLOGIC STAGE CLASSIFICATION (pTNM, AJCC 8th Edition) TNM Descriptors:    y (post-treatment)  Primary Tumor (pT):    pT3c  Regional Lymph Nodes (pN):    pNX   FIGO STAGE FIGO Stage:    IIIC   ADDITIONAL FINDINGS Additional Findings:    Serous tubal intraepithelial carcinoma (STIC)  Additional Findings:    Left ovary with no definite carcinoma identified, left-sided nodule of endometriosis and peritoneal inclusion cyst    10/18/2020 - 10/20/2020 Hospital Admission   She was admitted to [place] for interval debulking surgery   11/19/2020 Tumor Marker   Patient's tumor was tested for the following markers: CA-125. Results of the tumor marker test revealed 38.3   12/12/2020 Tumor Marker   Patient's tumor was tested for the following markers CA-125. Results of the tumor marker test revealed 42.3.   01/02/2021 Tumor Marker   Patient's tumor was tested for the following markers: CA-125. Results of the tumor marker test revealed  37.3   01/29/2021 Tumor Marker   Patient's tumor was tested for the following markers: CA-125 Results of the tumor marker test revealed 44.3   01/29/2021 Imaging   1. Interval increase in loculated appearing ascites in the low central abdomen and pelvis.  2. There is extensive peritoneal nodularity surrounding this fluid, the nodularity itself not appreciably changed in appearance compared to prior examination. 3. Additional peritoneal nodularity and/or mesenteric lymph nodes are unchanged. 4. Interval decrease in size of a low-attenuation nodule overlying the right hemidiaphragm, now measuring 1.2 cm, previously 1.8 cm when measured similarly. Findings are consistent with treatment response of a metastatic nodule. No other evidence of intrathoracic metastatic disease. 5. Status post hysterectomy, oophorectomy, and omentectomy.   01/31/2021 - 08/26/2021 Chemotherapy   She is started on Olaparib       03/04/2021 Tumor Marker   Patient's tumor was tested for the following markers: CA-125 Results of the tumor marker test revealed 39.7   04/01/2021 Tumor Marker   Patient's tumor was tested for the following markers: CA-125 Results of the tumor marker test revealed 32.3   05/10/2021 Imaging   1. No significant change in peritoneal carcinomatosis since previous study of 3 months ago, primarily in the pelvis where there is complex fluid and peritoneal nodularity. 2. No evidence of solid visceral organ metastasis. No evidence of bowel or ureteral obstruction. 3.  Aortic Atherosclerosis (ICD10-I70.0).   06/13/2021 Tumor Marker   Patient's tumor was tested for the following markers: CA-125. Results of the tumor marker test revealed 48.3.   07/29/2021 Tumor Marker   Patient's tumor was tested for the following markers: CA-125. Results of the tumor marker test revealed 54.   08/25/2021 Imaging   1. There is extensive peritoneal soft tissue thickening and nodularity, particularly about the low right  hemipelvis, which is similar to prior examination. Some small peritoneal nodules throughout the abdomen and pelvis are slightly enlarged, other nodules are unchanged. 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 2. No significant change in a subpleural nodule overlying the right hemidiaphragm. 3. Unchanged, loculated small volume pelvic ascites. 4. Status post hysterectomy, oophorectomy, and omentectomy. 5. Coronary artery disease.   Aortic Atherosclerosis (ICD10-I70.0).     08/26/2021 Tumor Marker   Patient's tumor was tested for the following markers: CA-125. Results of the tumor marker test revealed 56.   09/04/2021 Tumor Marker   Patient's tumor was tested for the following markers: CA-125. Results of the tumor marker test revealed 55.4.   09/05/2021 -  Chemotherapy   Patient is on Treatment Plan : OVARIAN RECURRENT 3RD LINE Carboplatin D1 / Gemcitabine D1,8 (4/800) q21d     10/07/2021 Tumor Marker   Patient's tumor was tested for the following markers: CA-125. Results of the tumor marker test revealed 51.9.   11/05/2021 Tumor Marker   Patient's tumor was tested for the following markers: CA-125. Results of the tumor marker test revealed 45.6.   11/27/2021 Imaging   1. Overall stable to slightly improved ill-defined soft tissue density throughout the pelvis. 2. Slight interval decrease in size of the omental lesions. 3. Stable free pelvic fluid. 4. New surgical changes from a right hip replacement. 5. New nodular and somewhat triangular density at the left lung base is likely an area of atelectasis. 6. Aortic atherosclerosis.     PHYSICAL EXAMINATION: ECOG PERFORMANCE STATUS: 1 - Symptomatic but completely ambulatory  Vitals:   12/24/21 1055  BP: (!) 144/75  Pulse: 83  Resp: 18  Temp: 97.8 F (36.6 C)  SpO2: 100%   Filed Weights   12/24/21 1055  Weight: 142 lb 3.2 oz (64.5 kg)    GENERAL:alert, no distress and  comfortable NEURO: alert & oriented x 3 with fluent speech, no focal motor/sensory  deficits  LABORATORY DATA:  I have reviewed the data as listed    Component Value Date/Time   NA 143 12/24/2021 1047   NA 141 11/06/2016 1102   K 3.6 12/24/2021 1047   K 3.9 11/06/2016 1102   CL 108 12/24/2021 1047   CO2 27 12/24/2021 1047   CO2 27 11/06/2016 1102   GLUCOSE 97 12/24/2021 1047   GLUCOSE 130 11/06/2016 1102   BUN 19 12/24/2021 1047   BUN 18.0 11/06/2016 1102   CREATININE 1.19 (H) 12/24/2021 1047   CREATININE 1.42 (H) 02/21/2021 1414   CREATININE 1.3 (H) 11/06/2016 1102   CALCIUM 9.9 12/24/2021 1047   CALCIUM 10.2 11/06/2016 1102   PROT 7.0 12/24/2021 1047   PROT 7.1 11/06/2016 1102   ALBUMIN 3.7 12/24/2021 1047   ALBUMIN 3.7 11/06/2016 1102   AST 33 12/24/2021 1047   AST 52 (H) 11/06/2016 1102   ALT 17 12/24/2021 1047   ALT 58 (H) 11/06/2016 1102   ALKPHOS 140 (H) 12/24/2021 1047   ALKPHOS 159 (H) 11/06/2016 1102   BILITOT 0.6 12/24/2021 1047   BILITOT 0.53 11/06/2016 1102   GFRNONAA 45 (L) 12/24/2021 1047   GFRNONAA 34 (L) 02/21/2021 1414   GFRAA 40 (L) 02/21/2021 1414    No results found for: SPEP, UPEP  Lab Results  Component Value Date   WBC 2.8 (L) 12/24/2021   NEUTROABS 1.2 (L) 12/24/2021   HGB 8.7 (L) 12/24/2021   HCT 27.2 (L) 12/24/2021   MCV 97.1 12/24/2021   PLT 146 (L) 12/24/2021      Chemistry      Component Value Date/Time   NA 143 12/24/2021 1047   NA 141 11/06/2016 1102   K 3.6 12/24/2021 1047   K 3.9 11/06/2016 1102   CL 108 12/24/2021 1047   CO2 27 12/24/2021 1047   CO2 27 11/06/2016 1102   BUN 19 12/24/2021 1047   BUN 18.0 11/06/2016 1102   CREATININE 1.19 (H) 12/24/2021 1047   CREATININE 1.42 (H) 02/21/2021 1414   CREATININE 1.3 (H) 11/06/2016 1102      Component Value Date/Time   CALCIUM 9.9 12/24/2021 1047   CALCIUM 10.2 11/06/2016 1102   ALKPHOS 140 (H) 12/24/2021 1047   ALKPHOS 159 (H) 11/06/2016 1102   AST 33 12/24/2021 1047    AST 52 (H) 11/06/2016 1102   ALT 17 12/24/2021 1047   ALT 58 (H) 11/06/2016 1102   BILITOT 0.6 12/24/2021 1047   BILITOT 0.53 11/06/2016 1102

## 2021-12-24 NOTE — Assessment & Plan Note (Signed)
Her last imaging studies showed positive response to treatment Due to history of severe pancytopenia, we have made informed decision to space out her treatment to every 4 weeks and for her to receive carboplatin and Gemzar together and omit day 8 Gemzar I recommend we continue this treatment for another few more months with plan to repeat imaging study in March

## 2021-12-24 NOTE — Assessment & Plan Note (Signed)
She has prolonged pancytopenia after each cycle of treatment She is mildly neutropenic today but does not need to delay treatment We discussed risk of infection and risk of bleeding with her low platelet count She does not need transfusion support Based on the trend of her CBC, I recommend spacing out her treatment to every 4 weeks and she is in agreement We will proceed with treatment without delay despite pancytopenia today

## 2021-12-24 NOTE — Assessment & Plan Note (Signed)
She has stable chronic kidney disease stage III Observe closely Will adjust the dose of carboplatin accordingly

## 2021-12-25 ENCOUNTER — Telehealth: Payer: Self-pay

## 2021-12-25 LAB — CA 125: Cancer Antigen (CA) 125: 39.6 U/mL — ABNORMAL HIGH (ref 0.0–38.1)

## 2021-12-25 NOTE — Telephone Encounter (Signed)
-----   Message from Heath Lark, MD sent at 12/25/2021  9:03 AM EST ----- Let her know CA-125 is better

## 2021-12-25 NOTE — Telephone Encounter (Signed)
Called pt to give below information. Pt verbalized thanks and understanding.

## 2021-12-26 ENCOUNTER — Telehealth: Payer: Self-pay

## 2021-12-26 NOTE — Telephone Encounter (Signed)
Returned her call. She is having facial redness  that feels like a sun burn. It started yesterday. Told her to take a picture to show Dr. [name] at the next visit. The redness is caused by the steroids with treatment and will fade in a few days. Ask her to call the office back if needed. She verbalized understanding.  Just FYI

## 2021-12-30 ENCOUNTER — Telehealth: Payer: Self-pay

## 2021-12-30 NOTE — Telephone Encounter (Signed)
Pt. Returned call for f/u. Pt. States she is feeling better w/ minimal right hip pain. Pt. Stated she has not been checking her BP in several weeks. Encouraged patient to start checking BP and record numbers. Pt. Verbalized understanding and agreed. I will reassess patient in a week to f/u.   Total time spent: 7 minutes Vanetta Shawl, [facility]

## 2021-12-30 NOTE — Telephone Encounter (Signed)
Called pt. To complete fall assesment and f/u. [place].  Total time spent: 2 minutes Vanetta Shawl, [facility]

## 2022-01-08 ENCOUNTER — Encounter: Payer: Self-pay | Admitting: Internal Medicine

## 2022-01-08 ENCOUNTER — Encounter: Payer: Self-pay | Admitting: Adult Health

## 2022-01-10 ENCOUNTER — Telehealth: Payer: Self-pay

## 2022-01-10 NOTE — Telephone Encounter (Signed)
Called pt. To obtain BP readings. 12/30/21- 122/75 01/01/22- 117/69 01/10/22- 111/69  Also went over fall precautions w/ patient. Educated patient on fall safety precautions to include: use assistive device for mobility at all times, make sure walkways are free of clutter, & well lit, do not walk around barefoot or in socks, and avoid using rugs. Pt. Verbalized understanding and agreed.  Total time spent: [place], [facility]

## 2022-01-14 ENCOUNTER — Other Ambulatory Visit: Payer: Self-pay

## 2022-01-14 ENCOUNTER — Ambulatory Visit: Payer: PPO | Admitting: Nurse Practitioner

## 2022-01-14 ENCOUNTER — Encounter: Payer: Self-pay | Admitting: Nurse Practitioner

## 2022-01-14 VITALS — HR 100 | Temp 97.2°F

## 2022-01-14 DIAGNOSIS — U071 COVID-19: Secondary | ICD-10-CM

## 2022-01-14 DIAGNOSIS — Z1152 Encounter for screening for COVID-19: Secondary | ICD-10-CM | POA: Diagnosis not present

## 2022-01-14 LAB — POC COVID19 BINAXNOW: SARS Coronavirus 2 Ag: POSITIVE — AB

## 2022-01-14 MED ORDER — MOLNUPIRAVIR EUA 200MG CAPSULE: 4.0000 | ORAL_CAPSULE | Freq: Two times a day (BID) | ORAL | 0 refills | Status: AC

## 2022-01-14 NOTE — Progress Notes (Signed)
THIS ENCOUNTER IS A VIRTUAL VISIT DUE TO COVID-19 - PATIENT WAS NOT SEEN IN THE OFFICE.  PATIENT HAS CONSENTED TO VIRTUAL VISIT / TELEMEDICINE VISIT   Virtual Visit via telephone Note  I connected with  Kayla Baker on 01/14/2022 by telephone.  I verified that I am speaking with the correct person using two identifiers.    I discussed the limitations of evaluation and management by telemedicine and the availability of in person appointments. The patient expressed understanding and agreed to proceed.  History of Present Illness:  Pulse 100    Temp (!) 97.2 F (36.2 C)    SpO2 99%  86 y.o. patient contacted office reporting URI sx . she tested positive by test in parking lot today. OV was conducted by telephone to minimize exposure. This patient was vaccinated /for covid 19, last 06/11/21 Immunization History  Administered Date(s) Administered   DTaP 07/01/2004   Influenza Whole 08/15/2013   Influenza, High Dose Seasonal PF 08/28/2014, 09/20/2015, 08/19/2017, 09/01/2018, 07/28/2019   Influenza-Unspecified 08/04/2020, 08/15/2021   PFIZER(Purple Top)SARS-COV-2 Vaccination 12/12/2019, 12/31/2019, 08/03/2020, 06/11/2021   Pneumococcal Conjugate-13 09/20/2015   Pneumococcal Polysaccharide-23 11/01/2009   Td 08/10/2009, 04/11/2020   Zoster, Live 08/02/2011    Sx began 3 days ago with sore throat, congestion, fatigue, nonproductive cough and has started to produce clear mucus.  Treatments tried so far: tylenol  Exposures: husband   Medications  Current Outpatient Medications (Endocrine & Metabolic):    glimepiride (AMARYL) 1 MG tablet, Take 1 tab by mouth in the morning if fasting is running 150+. Please do not take this medication if you are ill or not eating as this can cause a low blood sugar. (Patient taking differently: Take 1 mg by mouth See admin instructions. Take 1 tab by mouth daily as needed if fasting is running 150+. Please do not take this medication if you are ill or not  eating as this can cause a low blood sugar.)  Current Outpatient Medications (Cardiovascular):    ezetimibe (ZETIA) 10 MG tablet, Patient knows to take by mouth  !   - Thanks (Patient taking differently: Take 10 mg by mouth daily.)   hydrochlorothiazide (HYDRODIURIL) 25 MG tablet, TAKE ONE TABLET BY MOUTH DAILY FOR FOR BLOOD PRESSURE AND FLUID RETENTION/ ANKLE SWELLING   metoprolol succinate (TOPROL-XL) 25 MG 24 hr tablet, Take  1 tablet  Daily  for BP       /TAKE 1 TABLET BY MOUTH DAILY FOR BLOOD PRESSURE (Patient taking differently: Take 25 mg by mouth daily.)   rosuvastatin (CRESTOR) 5 MG tablet, Take 1 tablet (5 mg total) by mouth 3 (three) times a week. In the evening for cholesterol. (Patient taking differently: Take 5 mg by mouth every Monday, Wednesday, and Friday. In the evening for cholesterol.)  Current Outpatient Medications (Respiratory):    loratadine (CLARITIN) 10 MG tablet, Take 10 mg by mouth daily as needed for allergies.  Current Outpatient Medications (Analgesics):    acetaminophen (TYLENOL) 325 MG tablet, Take 325 mg by mouth every 6 (six) hours as needed for headache.   allopurinol (ZYLOPRIM) 300 MG tablet, TAKE 1 TABLET BY MOUTH DAILY FOR GOUT PREVENTION (Patient taking differently: Take 150 mg by mouth daily. FOR GOUT PREVENTION,)   HYDROcodone-acetaminophen (NORCO/VICODIN) 5-325 MG tablet, Take 1-2 tablets by mouth every 6 (six) hours as needed for moderate pain. (Patient not taking: Reported on 01/14/2022)  Current Outpatient Medications (Hematological):    cyanocobalamin 1000 MCG tablet, Take 1,000 mcg by mouth daily.  Current Outpatient Medications (Other):    Cholecalciferol (VITAMIN D3) 5000 units CAPS, Take 5,000 Units by mouth daily.   gabapentin (NEURONTIN) 300 MG capsule, Take 1 capsule 4 x  /day for Pain   lidocaine-prilocaine (EMLA) cream, Apply to affected area once (Patient taking differently: 1 application once as needed (for port access).)   Magnesium 250  MG TABS, Take 250 mg by mouth every other day.   ondansetron (ZOFRAN) 8 MG tablet, Take 1 tablet (8 mg total) by mouth 2 (two) times daily as needed for refractory nausea / vomiting. Start on day 3 after carboplatin chemo.   polyethylene glycol (MIRALAX) 17 g packet, Take 17 g by mouth 2 (two) times daily. 17 grams in 6 oz of favorite drink twice a day until bowel movement.  LAXITIVE.  Restart if two days since last bowel movement   trolamine salicylate (ASPERCREME) 10 % cream, Apply 1 application topically as needed for muscle pain (Use if you still feel tingles in feet in the morning after taking Gabapentin).   vitamin C (ASCORBIC ACID) 500 MG tablet, Take 500 mg by mouth every evening.    blood glucose meter kit and supplies KIT, Dispense based on patient and insurance preference. Use to check blood sugar once daily. Dx: E11.22, N18.30   glucose blood (FREESTYLE TEST STRIPS) test strip, USE TO CHECK BLOOD GLUCOSE ONCE DAILY   Lancets (FREESTYLE) lancets, CHECK BLOOD SUGAR DAILY AS DIRECTED   PFIZER-BIONT COVID-19 VAC-TRIS SUSP injection,    prochlorperazine (COMPAZINE) 10 MG tablet, Take 1 tablet (10 mg total) by mouth every 6 (six) hours as needed for nausea or vomiting. (Patient not taking: Reported on 11/08/2021)  Allergies:  Allergies  Allergen Reactions   Keflex [Cephalexin] Swelling    Tongue and throat swells   Meloxicam Swelling   Prednisone Swelling and Other (See Comments)    Can Not take High doses Cannot tolerate high doses Can Not take High doses   Premarin [Estrogens Conjugated] Hives   Trovan [Alatrofloxacin] Other (See Comments)    Unknown reaction    Problem list She has Essential hypertension; Hyperlipidemia associated with type 2 diabetes mellitus ([place]); T2_NIDDM w/CKD3 ([place]); Vitamin D deficiency; Medication management; Diabetic neuropathy, painful ([place]); Gout; History of left breast cancer; Chemotherapy-induced peripheral neuropathy ([place]); Port catheter in place; CKD  stage 3 due to type 2 diabetes mellitus ([place]); Edema of right lower leg; Ovarian cancer ([place]); Goals of care, counseling/discussion; Anemia in neoplastic disease; Pancytopenia, acquired ([place]); Cancer associated pain; Genetic testing; Deficiency anemia; Mild nonproliferative diabetic retinopathy of both eyes without macular edema associated with type 2 diabetes mellitus ([place]); Osteopenia; Elevated liver enzymes; Aortic atherosclerosis ([place]) by Abd CT scan on 05/10/2021; Nodule of lower lobe of right lung; Poor dentition; Closed displaced fracture of right femoral neck ([place]); Adhesive capsulitis of right shoulder; History of breast cancer; and Postoperative breast asymmetry on their problem list.   Social History:   reports that she has never smoked. She has never used smokeless tobacco. She reports that she does not drink alcohol and does not use drugs.  Observations/Objective:  General : Well sounding patient in no apparent distress HEENT: no hoarseness, no cough for duration of visit Lungs: speaks in complete sentences, no audible wheezing, no apparent distress Neurological: alert, oriented x 3 Psychiatric: pleasant, judgement appropriate   Assessment and Plan:    Mayana was seen today for acute visit.  Diagnoses and all orders for this visit:  Encounter for screening for COVID-19 -  POC COVID-19  COVID-19 -     molnupiravir EUA (LAGEVRIO) 200 mg CAPS capsule; Take 4 capsules (800 mg total) by mouth 2 (two) times daily for 5 days. If develops fever on this or symptoms worsen call the oncologist Covid 19 positive per rapid screening test today at office in parking lot Risk factors include: ovarian cancer currently receiving chemotherapy Symptoms are: mild Due to co morbid conditions and risk factors, discussed antivirals  Immue support reviewed  Take tylenol PRN temp 101+ Push hydration Regular ambulation or calf exercises exercises for clot prevention, unable to take low dose  aspirin Sx supportive therapy suggested Follow up via mychart or telephone if needed Advised patient obtain O2 monitor; present to ED if persistently <90% or with severe dyspnea, CP, fever uncontrolled by tylenol, confusion, sudden decline Should remain in isolation until at least 5 days from onset of sx, 24-48 hours fever free without tylenol, sx such as cough are improved.      Follow Up Instructions:  I discussed the assessment and treatment plan with the patient. The patient was provided an opportunity to ask questions and all were answered. The patient agreed with the plan and demonstrated an understanding of the instructions.   The patient was advised to call back or seek an in-person evaluation if the symptoms worsen or if the condition fails to improve as anticipated.  I provided 20 minutes of non-face-to-face time during this encounter.   Magda Bernheim, NP

## 2022-01-15 ENCOUNTER — Encounter: Payer: Self-pay | Admitting: Internal Medicine

## 2022-01-17 ENCOUNTER — Telehealth: Payer: Self-pay

## 2022-01-17 MED FILL — Fosaprepitant Dimeglumine For IV Infusion 150 MG (Base Eq): INTRAVENOUS | Qty: 5 | Status: AC

## 2022-01-17 MED FILL — Dexamethasone Sodium Phosphate Inj 100 MG/10ML: INTRAMUSCULAR | Qty: 1 | Status: AC

## 2022-01-17 NOTE — Telephone Encounter (Signed)
Returned her call. She saw PCP 2/14 and tested positive for covid on 2/14. Taking antiviral and feeling better. Appts canceled for 2/20 and sent scheduling message to reschedule in 21 days from 2/14.  She will call the office back for questions or concerns.

## 2022-01-20 ENCOUNTER — Inpatient Hospital Stay: Payer: PPO

## 2022-01-20 ENCOUNTER — Inpatient Hospital Stay: Payer: PPO | Admitting: Hematology and Oncology

## 2022-01-25 ENCOUNTER — Other Ambulatory Visit: Payer: Self-pay | Admitting: Adult Health

## 2022-01-25 DIAGNOSIS — M1000 Idiopathic gout, unspecified site: Secondary | ICD-10-CM

## 2022-01-29 ENCOUNTER — Telehealth: Payer: Self-pay | Admitting: Hematology and Oncology

## 2022-01-29 NOTE — Telephone Encounter (Signed)
.  Called patient to schedule appointment per 2/17 inbasket, patient is aware of date and time.  ? ?

## 2022-02-04 ENCOUNTER — Other Ambulatory Visit: Payer: Self-pay | Admitting: *Deleted

## 2022-02-04 ENCOUNTER — Other Ambulatory Visit: Payer: Self-pay

## 2022-02-04 ENCOUNTER — Inpatient Hospital Stay: Payer: PPO

## 2022-02-04 ENCOUNTER — Inpatient Hospital Stay: Payer: PPO | Attending: Gynecologic Oncology

## 2022-02-04 ENCOUNTER — Inpatient Hospital Stay: Payer: PPO | Admitting: Hematology and Oncology

## 2022-02-04 VITALS — BP 130/67 | HR 85 | Temp 97.7°F | Resp 16 | Ht 64.0 in | Wt 141.4 lb

## 2022-02-04 DIAGNOSIS — D61818 Other pancytopenia: Secondary | ICD-10-CM | POA: Diagnosis not present

## 2022-02-04 DIAGNOSIS — C561 Malignant neoplasm of right ovary: Secondary | ICD-10-CM | POA: Diagnosis not present

## 2022-02-04 DIAGNOSIS — C569 Malignant neoplasm of unspecified ovary: Secondary | ICD-10-CM

## 2022-02-04 DIAGNOSIS — Z853 Personal history of malignant neoplasm of breast: Secondary | ICD-10-CM

## 2022-02-04 DIAGNOSIS — N183 Chronic kidney disease, stage 3 unspecified: Secondary | ICD-10-CM | POA: Diagnosis not present

## 2022-02-04 DIAGNOSIS — C50212 Malignant neoplasm of upper-inner quadrant of left female breast: Secondary | ICD-10-CM

## 2022-02-04 DIAGNOSIS — E1122 Type 2 diabetes mellitus with diabetic chronic kidney disease: Secondary | ICD-10-CM | POA: Diagnosis not present

## 2022-02-04 DIAGNOSIS — Z5111 Encounter for antineoplastic chemotherapy: Secondary | ICD-10-CM | POA: Insufficient documentation

## 2022-02-04 DIAGNOSIS — Z95828 Presence of other vascular implants and grafts: Secondary | ICD-10-CM

## 2022-02-04 DIAGNOSIS — Z170 Estrogen receptor positive status [ER+]: Secondary | ICD-10-CM

## 2022-02-04 LAB — CBC WITH DIFFERENTIAL (CANCER CENTER ONLY)
Abs Immature Granulocytes: 0.01 10*3/uL (ref 0.00–0.07)
Basophils Absolute: 0 10*3/uL (ref 0.0–0.1)
Basophils Relative: 0 %
Eosinophils Absolute: 0.1 10*3/uL (ref 0.0–0.5)
Eosinophils Relative: 2 %
HCT: 28.7 % — ABNORMAL LOW (ref 36.0–46.0)
Hemoglobin: 9.2 g/dL — ABNORMAL LOW (ref 12.0–15.0)
Immature Granulocytes: 0 %
Lymphocytes Relative: 27 %
Lymphs Abs: 1.2 10*3/uL (ref 0.7–4.0)
MCH: 31.2 pg (ref 26.0–34.0)
MCHC: 32.1 g/dL (ref 30.0–36.0)
MCV: 97.3 fL (ref 80.0–100.0)
Monocytes Absolute: 0.6 10*3/uL (ref 0.1–1.0)
Monocytes Relative: 13 %
Neutro Abs: 2.6 10*3/uL (ref 1.7–7.7)
Neutrophils Relative %: 58 %
Platelet Count: 98 10*3/uL — ABNORMAL LOW (ref 150–400)
RBC: 2.95 MIL/uL — ABNORMAL LOW (ref 3.87–5.11)
RDW: 18.7 % — ABNORMAL HIGH (ref 11.5–15.5)
WBC Count: 4.5 10*3/uL (ref 4.0–10.5)
nRBC: 0 % (ref 0.0–0.2)

## 2022-02-04 LAB — CMP (CANCER CENTER ONLY)
ALT: 21 U/L (ref 0–44)
AST: 39 U/L (ref 15–41)
Albumin: 3.6 g/dL (ref 3.5–5.0)
Alkaline Phosphatase: 123 U/L (ref 38–126)
Anion gap: 6 (ref 5–15)
BUN: 25 mg/dL — ABNORMAL HIGH (ref 8–23)
CO2: 29 mmol/L (ref 22–32)
Calcium: 10 mg/dL (ref 8.9–10.3)
Chloride: 107 mmol/L (ref 98–111)
Creatinine: 1.17 mg/dL — ABNORMAL HIGH (ref 0.44–1.00)
GFR, Estimated: 46 mL/min — ABNORMAL LOW (ref >60)
Glucose, Bld: 99 mg/dL (ref 70–99)
Potassium: 3.7 mmol/L (ref 3.5–5.1)
Sodium: 142 mmol/L (ref 135–145)
Total Bilirubin: 0.6 mg/dL (ref 0.3–1.2)
Total Protein: 7.1 g/dL (ref 6.5–8.1)

## 2022-02-04 MED ORDER — SODIUM CHLORIDE 0.9 % IV SOLN
247.2000 mg | Freq: Once | INTRAVENOUS | Status: AC
  Administered 2022-02-04: 250 mg via INTRAVENOUS
  Filled 2022-02-04: qty 25

## 2022-02-04 MED ORDER — HEPARIN SOD (PORK) LOCK FLUSH 100 UNIT/ML IV SOLN
500.0000 [IU] | Freq: Once | INTRAVENOUS | Status: AC | PRN
  Administered 2022-02-04: 500 [IU]

## 2022-02-04 MED ORDER — SODIUM CHLORIDE 0.9% FLUSH
10.0000 mL | INTRAVENOUS | Status: DC | PRN
  Administered 2022-02-04: 10 mL

## 2022-02-04 MED ORDER — DIPHENHYDRAMINE HCL 50 MG/ML IJ SOLN
25.0000 mg | Freq: Once | INTRAMUSCULAR | Status: AC
  Administered 2022-02-04: 25 mg via INTRAVENOUS
  Filled 2022-02-04: qty 1

## 2022-02-04 MED ORDER — PALONOSETRON HCL INJECTION 0.25 MG/5ML
0.2500 mg | Freq: Once | INTRAVENOUS | Status: AC
  Administered 2022-02-04: 0.25 mg via INTRAVENOUS
  Filled 2022-02-04: qty 5

## 2022-02-04 MED ORDER — SODIUM CHLORIDE 0.9 % IV SOLN
500.0000 mg/m2 | Freq: Once | INTRAVENOUS | Status: AC
  Administered 2022-02-04: 836 mg via INTRAVENOUS
  Filled 2022-02-04: qty 21.99

## 2022-02-04 MED ORDER — SODIUM CHLORIDE 0.9 % IV SOLN: Freq: Once | INTRAVENOUS | Status: DC

## 2022-02-04 MED ORDER — SODIUM CHLORIDE 0.9 % IV SOLN
150.0000 mg | Freq: Once | INTRAVENOUS | Status: AC
  Administered 2022-02-04: 150 mg via INTRAVENOUS
  Filled 2022-02-04: qty 150

## 2022-02-04 MED ORDER — SODIUM CHLORIDE 0.9 % IV SOLN: Freq: Once | INTRAVENOUS | Status: AC

## 2022-02-04 MED ORDER — SODIUM CHLORIDE 0.9% FLUSH
10.0000 mL | Freq: Once | INTRAVENOUS | Status: AC
  Administered 2022-02-04: 10 mL

## 2022-02-04 MED ORDER — SODIUM CHLORIDE 0.9 % IV SOLN
10.0000 mg | Freq: Once | INTRAVENOUS | Status: AC
  Administered 2022-02-04: 10 mg via INTRAVENOUS
  Filled 2022-02-04: qty 10

## 2022-02-04 MED ORDER — FAMOTIDINE IN NACL 20-0.9 MG/50ML-% IV SOLN
20.0000 mg | Freq: Once | INTRAVENOUS | Status: AC
  Administered 2022-02-04: 20 mg via INTRAVENOUS
  Filled 2022-02-04: qty 50

## 2022-02-04 NOTE — Patient Instructions (Signed)
[facility]  Discharge Instructions: ?Thank you for choosing [facility] to provide your oncology and hematology care.  ? ?If you have a lab appointment with the [facility], please go directly to the [facility] and check in at the registration area. ?  ?Wear comfortable clothing and clothing appropriate for easy access to any Portacath or PICC line.  ? ?We strive to give you quality time with your provider. You may need to reschedule your appointment if you arrive late (15 or more minutes).  Arriving late affects you and other patients whose appointments are after yours.  Also, if you miss three or more appointments without notifying the office, you may be dismissed from the clinic at the provider?s discretion.    ?  ?For prescription refill requests, have your pharmacy contact our office and allow 72 hours for refills to be completed.   ? ?Today you received the following chemotherapy and/or immunotherapy agents: Carboplatin, Gemcitabine.    ?  ?To help prevent nausea and vomiting after your treatment, we encourage you to take your nausea medication as directed. ? ?BELOW ARE SYMPTOMS THAT SHOULD BE REPORTED IMMEDIATELY: ?*FEVER GREATER THAN 100.4 F (38 ?C) OR HIGHER ?*CHILLS OR SWEATING ?*NAUSEA AND VOMITING THAT IS NOT CONTROLLED WITH YOUR NAUSEA MEDICATION ?*UNUSUAL SHORTNESS OF BREATH ?*UNUSUAL BRUISING OR BLEEDING ?*URINARY PROBLEMS (pain or burning when urinating, or frequent urination) ?*BOWEL PROBLEMS (unusual diarrhea, constipation, pain near the anus) ?TENDERNESS IN MOUTH AND THROAT WITH OR WITHOUT PRESENCE OF ULCERS (sore throat, sores in mouth, or a toothache) ?UNUSUAL RASH, SWELLING OR PAIN  ?UNUSUAL VAGINAL DISCHARGE OR ITCHING  ? ?Items with * indicate a potential emergency and should be followed up as soon as possible or go to the Emergency Department if any problems should occur. ? ?Please show the CHEMOTHERAPY ALERT CARD or IMMUNOTHERAPY ALERT CARD  at check-in to the Emergency Department and triage nurse. ? ?Should you have questions after your visit or need to cancel or reschedule your appointment, please contact [facility]  Dept: [phone]  and follow the prompts.  Office hours are 8:00 a.m. to 4:30 p.m. Monday - Friday. Please note that voicemails left after 4:00 p.m. may not be returned until the following business day.  We are closed weekends and major holidays. You have access to a nurse at all times for urgent questions. Please call the main number to the clinic Dept: [phone] and follow the prompts. ? ? ?For any non-urgent questions, you may also contact your provider using MyChart. We now offer e-Visits for anyone 70 and older to request care online for non-urgent symptoms. For details visit [email or web address]. ?  ?Also download the MyChart app! Go to the app store, search "MyChart", open the app, select [facility], and log in with your MyChart username and password. ? ?Due to Covid, a mask is required upon entering the hospital/clinic. If you do not have a mask, one will be given to you upon arrival. For doctor visits, patients may have 1 support person aged 3 or older with them. For treatment visits, patients cannot have anyone with them due to current Covid guidelines and our immunocompromised population.  ? ?

## 2022-02-05 ENCOUNTER — Encounter: Payer: Self-pay | Admitting: Hematology and Oncology

## 2022-02-05 ENCOUNTER — Other Ambulatory Visit: Payer: Self-pay | Admitting: Internal Medicine

## 2022-02-05 DIAGNOSIS — Z853 Personal history of malignant neoplasm of breast: Secondary | ICD-10-CM

## 2022-02-05 LAB — CA 125: Cancer Antigen (CA) 125: 36.3 U/mL (ref 0.0–38.1)

## 2022-02-05 NOTE — Progress Notes (Signed)
[facility] OFFICE PROGRESS NOTE  Patient Care Team: [name], MD as PCP - Kinnie Scales, OD as Referring Physician (Optometry) [name], MD as Consulting Physician (Dermatology) Latanya Maudlin, MD as Consulting Physician (Orthopedic Surgery) Inda Castle, MD (Inactive) as Consulting Physician (Gastroenterology) Jacqulyn Liner, RN as Oncology Nurse Navigator (Oncology) Heath Lark, MD as Consulting Physician (Hematology and Oncology) Madelon Lips, MD as Consulting Physician (Nephrology) Rush Landmark, [facility] as Pharmacist (Pharmacist)  ASSESSMENT & PLAN:  Ovarian cancer [facility]) Her last imaging studies showed positive response to treatment Due to history of severe pancytopenia, we have made informed decision to space out her treatment to every 4-5 weeks and for her to receive carboplatin and Gemzar together and omit day 8 Gemzar She remained pancytopenic today We discussed the risk and benefits of continuing treatment and she is in agreement to proceed I plan to repeat imaging study next month Clinically, she continues to respond to treatment with improvement and normalization of tumor marker  Pancytopenia, acquired [facility]) She has prolonged pancytopenia after each cycle of treatment We discussed risk bleeding with her low platelet count She does not need transfusion support Based on the trend of her CBC, I recommend spacing out her treatment to every 4-5 weeks and she is in agreement We will proceed with treatment without delay despite pancytopenia today  CKD stage 3 due to type 2 diabetes mellitus ([place]) She has stable chronic kidney disease stage III Observe closely Will adjust the dose of carboplatin accordingly  Orders Placed This Encounter  Procedures   CT ABDOMEN PELVIS W CONTRAST    Standing Status:   Future    Standing Expiration Date:   02/05/2023    Order Specific Question:   If indicated for the ordered procedure, I authorize the  administration of contrast media per Radiology protocol    Answer:   Yes    Order Specific Question:   Preferred imaging location?    Answer:   [facility]    Order Specific Question:   Radiology Contrast Protocol - do NOT remove file path    Answer:   [email or web address]    All questions were answered. The patient knows to call the clinic with any problems, questions or concerns. The total time spent in the appointment was 30 minutes encounter with patients including review of chart and various tests results, discussions about plan of care and coordination of care plan   Heath Lark, MD 02/05/2022 12:20 PM  INTERVAL HISTORY: Please see below for problem oriented charting. she returns for treatment follow-up with her husband She tolerated last cycle of treatment well Denies spontaneous bleeding No abdominal pain, nausea or changes in bowel habits  REVIEW OF SYSTEMS:   Constitutional: Denies fevers, chills or abnormal weight loss Eyes: Denies blurriness of vision Ears, nose, mouth, throat, and face: Denies mucositis or sore throat Respiratory: Denies cough, dyspnea or wheezes Cardiovascular: Denies palpitation, chest discomfort or lower extremity swelling Gastrointestinal:  Denies nausea, heartburn or change in bowel habits Skin: Denies abnormal skin rashes Lymphatics: Denies new lymphadenopathy or easy bruising Neurological:Denies numbness, tingling or new weaknesses Behavioral/Psych: Mood is stable, no new changes  All other systems were reviewed with the patient and are negative.  I have reviewed the past medical history, past surgical history, social history and family history with the patient and they are unchanged from previous note.  ALLERGIES:  is allergic to keflex [cephalexin], meloxicam, prednisone, premarin [estrogens conjugated], and trovan [alatrofloxacin].  MEDICATIONS:  Current Outpatient Medications  Medication Sig  Dispense Refill   acetaminophen (TYLENOL) 325 MG tablet Take 325 mg by mouth every 6 (six) hours as needed for headache.     allopurinol (ZYLOPRIM) 300 MG tablet Take 1 tablet (300 mg total) by mouth daily. FOR GOUT PREVENTION, 90 tablet 1   blood glucose meter kit and supplies KIT Dispense based on patient and insurance preference. Use to check blood sugar once daily. Dx: E11.22, N18.30 1 each 12   Cholecalciferol (VITAMIN D3) 5000 units CAPS Take 5,000 Units by mouth daily.     cyanocobalamin 1000 MCG tablet Take 1,000 mcg by mouth daily.     ezetimibe (ZETIA) 10 MG tablet Patient knows to take by mouth  !   - Thanks (Patient taking differently: Take 10 mg by mouth daily.) 90 tablet 3   gabapentin (NEURONTIN) 300 MG capsule Take 1 capsule 4 x  /day for Pain 360 capsule 3   glimepiride (AMARYL) 1 MG tablet Take 1 tab by mouth in the morning if fasting is running 150+. Please do not take this medication if you are ill or not eating as this can cause a low blood sugar. (Patient taking differently: Take 1 mg by mouth See admin instructions. Take 1 tab by mouth daily as needed if fasting is running 150+. Please do not take this medication if you are ill or not eating as this can cause a low blood sugar.) 90 tablet 1   glucose blood (FREESTYLE TEST STRIPS) test strip USE TO CHECK BLOOD GLUCOSE ONCE DAILY 100 strip 3   hydrochlorothiazide (HYDRODIURIL) 25 MG tablet TAKE ONE TABLET BY MOUTH DAILY FOR FOR BLOOD PRESSURE AND FLUID RETENTION/ ANKLE SWELLING 90 tablet 1   Lancets (FREESTYLE) lancets CHECK BLOOD SUGAR DAILY AS DIRECTED 100 each 3   lidocaine-prilocaine (EMLA) cream Apply to affected area once (Patient taking differently: 1 application once as needed (for port access).) 30 g 3   loratadine (CLARITIN) 10 MG tablet Take 10 mg by mouth daily as needed for allergies.     Magnesium 250 MG TABS Take 250 mg by mouth every other day.     metoprolol succinate (TOPROL-XL) 25 MG 24 hr tablet Take  1 tablet   Daily  for BP       /TAKE 1 TABLET BY MOUTH DAILY FOR BLOOD PRESSURE (Patient taking differently: Take 25 mg by mouth daily.) 90 tablet 3   ondansetron (ZOFRAN) 8 MG tablet Take 1 tablet (8 mg total) by mouth 2 (two) times daily as needed for refractory nausea / vomiting. Start on day 3 after carboplatin chemo. 30 tablet 1   PFIZER-BIONT COVID-19 VAC-TRIS SUSP injection      polyethylene glycol (MIRALAX) 17 g packet Take 17 g by mouth 2 (two) times daily. 17 grams in 6 oz of favorite drink twice a day until bowel movement.  LAXITIVE.  Restart if two days since last bowel movement 14 packet 0   rosuvastatin (CRESTOR) 5 MG tablet Take 1 tablet (5 mg total) by mouth 3 (three) times a week. In the evening for cholesterol. (Patient taking differently: Take 5 mg by mouth every Monday, Wednesday, and Friday. In the evening for cholesterol.) 36 tablet 3   trolamine salicylate (ASPERCREME) 10 % cream Apply 1 application topically as needed for muscle pain (Use if you still feel tingles in feet in the morning after taking Gabapentin).     vitamin C (ASCORBIC ACID) 500 MG tablet Take 500 mg by mouth every  evening.      No current facility-administered medications for this visit.    SUMMARY OF ONCOLOGIC HISTORY: Oncology History Overview Note  HRD positive   History of left breast cancer  02/26/2016 Initial Diagnosis   Left breast biopsy 11:00 position: invasive ductal carcinoma with DCIS, ER 90%, PR 10%, HER-2 negative, Ki-67 30%, grade 2, 2.2 cm palpable lesion T2 N0 stage II a clinical stage   03/18/2016 Surgery   Left lumpectomy: Invasive ductal carcinoma, grade 2, 6.3 cm, with high-grade DCIS, margins negative, 0/4 lymph nodes negative, ER 90%, via 10%, HER-2 negative ratio 0.97, Ki-67 30%, T3 N0 stage IIB   03/25/2016 Procedure   Genetic testing is negative for pathogenic mutations within any of the 20 Genes on the breast/ovarian cancer panel   04/04/2016 Oncotype testing   Oncotype DX recurrence  score 37, 25% 10 year distant risk of recurrence   04/17/2016 - 07/31/2016 Chemotherapy   Adjuvant chemotherapy with dose dense Adriamycin and Cytoxan followed by Abraxane weekly 8 ( discontinued due to neuropathy)   09/01/2016 - 09/26/2016 Radiation Therapy   Adj XRT 1) Left breast: 42.5 Gy in 17 fractions. 2) Left breast boost: 7.5 Gy in 3 fractions.   12/02/2016 -  Anti-estrogen oral therapy   Anastrozole 1 mg daily   09/05/2021 -  Chemotherapy   Patient is on Treatment Plan : OVARIAN RECURRENT 3RD LINE Carboplatin D1 / Gemcitabine D1,8 (4/800) q21d     Ovarian cancer ([place])  04/03/2020 Imaging   US pelvis Complex cystic and solid mass in LEFT adnexa 12.5 cm diameter question cystic ovarian neoplasm; recommend correlation with serum tumor markers and further evaluation by MR imaging with and without contrast.     04/04/2020 Imaging   US venous Doppler No evidence of deep venous thrombosis in the right lower extremity. Left common femoral vein also patent.   04/15/2020 Imaging   MRI pelvis 1. Large complex solid and cystic mass arising from the right adnexa measuring 10.8 by 11.5 by 11.1 cm. This has an aggressive appearance with extensive enhancing mural soft tissue components. Findings are highly suspicious for malignant ovarian neoplasm. 2. Extensive bilateral retroperitoneal and bilateral iliac adenopathy compatible with metastatic disease. 3. Signs of extensive peritoneal carcinomatosis including ascites, enhancement, thickening and nodularity of the peritoneal reflections, omental caking and bulky peritoneal nodularity. 4. Suspected serosal involvement of the dome of bladder with loss of normal fat plane. Cannot rule out mural invasion by tumor.   04/17/2020 Tumor Marker   Patient's tumor was tested for the following markers: CA-125 Results of the tumor marker test revealed 1762.   04/20/2020 Cancer Staging   Staging form: Ovary, Fallopian Tube, and Primary Peritoneal Carcinoma, AJCC  8th Edition - Clinical: FIGO Stage IIIC (cT3, cN1, cM0) - Signed by Heath Lark, MD on 04/20/2020    04/23/2020 Imaging   1. Redemonstrated dominant mixed solid and cystic mass arising from the vicinity of the right ovary measuring at least 11.5 x 10.7 cm, not significantly changed compared to prior MR, and consistent with primary ovarian malignancy.  2. Numerous bulky retroperitoneal, bilateral iliac, and pelvic sidewall lymph nodes. 3. Moderate volume ascites throughout the abdomen and pelvis with subtle thickening and nodularity throughout the peritoneum, and extensive bulky nodular metastatic disease of the omentum. 4. Constellation of findings is consistent with advanced nodal and peritoneal metastatic disease. 5. There are prominent subcentimeter epicardial lymph nodes, nonspecific although suspicious for nodal metastatic disease. No definite nodal metastatic disease in the chest. Attention  on follow-up. 6. There are multiple small subpleural nodules at the right lung base overlying the diaphragm, measuring up to 7 mm. These are generally nonspecific and less favored to represent pulmonary metastatic disease given distribution. Attention on follow-up. 7. Somewhat coarse contour of the liver, suggestive of cirrhosis, although out without overt morphologic stigmata. 8. Aortic Atherosclerosis (ICD10-I70.0).     04/24/2020 Procedure   Successful placement of a right internal jugular approach power injectable Port-A-Cath. The catheter is ready for immediate use.     05/02/2020 Tumor Marker   Patient's tumor was tested for the following markers: CA-125 Results of the tumor marker test revealed 1921   05/03/2020 - 12/12/2020 Chemotherapy   The patient had carboplatin and taxol for chemotherapy treatment.     05/08/2020 Procedure   Successful ultrasound-guided paracentesis yielding 2.6 liters of peritoneal fluid.   05/16/2020 Procedure   Successful ultrasound-guided paracentesis yielding 3.2  liters of peritoneal fluid   05/25/2020 Procedure   Successful ultrasound-guided paracentesis yielding 3 liters of peritoneal fluid.     06/01/2020 Procedure   Successful ultrasound-guided therapeutic paracentesis yielding 1.7 liters of peritoneal fluid.   06/14/2020 Tumor Marker   Patient's tumor was tested for the following markers: CA-125 Results of the tumor marker test revealed 1309   06/20/2020 Imaging   1. Dominant mixed cystic and solid mass in the central pelvis is stable in the interval. 2. Clear interval decrease in retroperitoneal and pelvic sidewall lymphadenopathy. The bulky omental disease has also clearly decreased in the interval. 3. Interval decrease in ascites. 4. Stable 8 mm subpleural nodule along the diaphragm. 5. Aortic Atherosclerosis (ICD10-I70.0).   07/10/2020 Tumor Marker   Patient's tumor was tested for the following markers: CA-125 Results of the tumor marker test revealed 220   08/03/2020 Tumor Marker   Patient's tumor was tested for the following markers: CA-125 Results of the tumor marker test revealed 53.3.   09/03/2020 Tumor Marker   Patient's tumor was tested for the following markers: CA-125 Results of the tumor marker test revealed 29.7   09/20/2020 Imaging   1. Substantial improvement. The right ovarian cystic and solid mass is half the volume that it measured on 06/20/2020. Similar reduction in the bulk of the omental caking of tumor. Prior ascites is resolved and the retroperitoneal adenopathy is markedly improved. 2. Other imaging findings of potential clinical significance: Stable pleural-based nodularity along the right hemidiaphragm measuring about 1.1 by 0.7 by 0.3 cm. Stable hypodense lesion of the right kidney upper pole, probably a cyst although the configuration of this lesion makes it difficult to obtain accurate density measurements. Left ovarian cyst, stable. Chondrocalcinosis involving the acetabular labra. Mild lumbar spondylosis and  degenerative disc disease. 3. Aortic atherosclerosis.     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 (stage ypT3c), predominantly viable with focal fibrosis and hemosiderin laden macrophages (possible mild treatment effect)  B: Ovary and fallopian tube, right, salpingo-oophorectomy - High grade serous carcinoma involving right ovary and fallopian tube with surface involvement, size up to 8 cm  - Areas of necrosis and hemosiderin laden macrophages suggestive of treatment effect - 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 - See synoptic report and comment  C: Ovary and fallopian tube, left, salpingo-oophorectomy - High grade serous carcinoma involving the left fallopian tube, size  up to 0.5 cm - Ovary with no definite involvement by carcinoma identified - Nodular area of endometriosis and peritoneal inclusion cyst also present - See synoptic report and comment   Procedure:    Bilateral salpingo-oophorectomy    Procedure:    Omentectomy    Specimen Integrity of Right Ovary:    Intact with surface involvement by tumor    Specimen Integrity of Left Ovary:    Capsule intact   TUMOR Tumor Site:    Right fallopian tube: favor as primary site; also involves right ovary and left fallopian tube  Histologic Type:    Serous carcinoma  Histologic Grade:    High grade  Tumor Size:    Greatest Dimension (Centimeters): in fallopian tube: 1.0 cm; in ovary: 8.0 cm Ovarian Surface Involvement:    Present    Laterality:    Right  Fallopian Tube Surface Involvement:    Present    Laterality:    Bilateral  Other Tissue / Organ Involvement:    Right ovary  Other Tissue / Organ Involvement:    Right fallopian tube  Other Tissue / Organ  Involvement:    Left fallopian tube  Other Tissue / Organ Involvement:    Omentum  Largest Extrapelvic Peritoneal Focus:    Macroscopic (greater than 2 cm)  Peritoneal / Ascitic Fluid:    Not submitted / unknown  Pleural Fluid:    Not submitted / unknown  Treatment Effect:    No definite or minimal response identified (chemotherapy response score [CRS] 1)   LYMPH NODES Regional Lymph Nodes:    No lymph nodes submitted or found   PATHOLOGIC STAGE CLASSIFICATION (pTNM, AJCC 8th Edition) TNM Descriptors:    y (post-treatment)  Primary Tumor (pT):    pT3c  Regional Lymph Nodes (pN):    pNX   FIGO STAGE FIGO Stage:    IIIC   ADDITIONAL FINDINGS Additional Findings:    Serous tubal intraepithelial carcinoma (STIC)  Additional Findings:    Left ovary with no definite carcinoma identified, left-sided nodule of endometriosis and peritoneal inclusion cyst    10/18/2020 - 10/20/2020 Hospital Admission   She was admitted to [facility] for interval debulking surgery   11/19/2020 Tumor Marker   Patient's tumor was tested for the following markers: CA-125. Results of the tumor marker test revealed 38.3   12/12/2020 Tumor Marker   Patient's tumor was tested for the following markers CA-125. Results of the tumor marker test revealed 42.3.   01/02/2021 Tumor Marker   Patient's tumor was tested for the following markers: CA-125. Results of the tumor marker test revealed 37.3   01/29/2021 Tumor Marker   Patient's tumor was tested for the following markers: CA-125 Results of the tumor marker test revealed 44.3   01/29/2021 Imaging   1. Interval increase in loculated appearing ascites in the low central abdomen and pelvis. 2. There is extensive peritoneal nodularity surrounding this fluid, the nodularity itself not appreciably changed in appearance compared to prior examination. 3. Additional peritoneal nodularity and/or mesenteric lymph nodes are unchanged. 4. Interval decrease in size of a  low-attenuation nodule overlying the right hemidiaphragm, now measuring 1.2 cm, previously 1.8 cm when measured similarly. Findings are consistent with treatment response of a metastatic nodule. No other evidence of intrathoracic metastatic disease. 5. Status post hysterectomy, oophorectomy, and omentectomy.   01/31/2021 - 08/26/2021 Chemotherapy   She is started on Olaparib       03/04/2021 Tumor Marker   Patient's tumor was tested  for the following markers: CA-125 Results of the tumor marker test revealed 39.7   04/01/2021 Tumor Marker   Patient's tumor was tested for the following markers: CA-125 Results of the tumor marker test revealed 32.3   05/10/2021 Imaging   1. No significant change in peritoneal carcinomatosis since previous study of 3 months ago, primarily in the pelvis where there is complex fluid and peritoneal nodularity. 2. No evidence of solid visceral organ metastasis. No evidence of bowel or ureteral obstruction. 3.  Aortic Atherosclerosis (ICD10-I70.0).   06/13/2021 Tumor Marker   Patient's tumor was tested for the following markers: CA-125. Results of the tumor marker test revealed 48.3.   07/29/2021 Tumor Marker   Patient's tumor was tested for the following markers: CA-125. Results of the tumor marker test revealed 54.   08/25/2021 Imaging   1. There is extensive peritoneal soft tissue thickening and nodularity, particularly about the low right hemipelvis, which is similar to prior examination. Some small peritoneal nodules throughout the abdomen and pelvis are slightly enlarged, other nodules are unchanged. 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 2. No significant change in a subpleural nodule overlying the right hemidiaphragm. 3. Unchanged, loculated small volume pelvic ascites. 4. Status post hysterectomy, oophorectomy, and omentectomy. 5. Coronary artery disease.   Aortic Atherosclerosis (ICD10-I70.0).      08/26/2021 Tumor Marker   Patient's tumor was tested for the following markers: CA-125. Results of the tumor marker test revealed 56.   09/04/2021 Tumor Marker   Patient's tumor was tested for the following markers: CA-125. Results of the tumor marker test revealed 55.4.   09/05/2021 -  Chemotherapy   Patient is on Treatment Plan : OVARIAN RECURRENT 3RD LINE Carboplatin D1 / Gemcitabine D1,8 (4/800) q21d     10/07/2021 Tumor Marker   Patient's tumor was tested for the following markers: CA-125. Results of the tumor marker test revealed 51.9.   11/05/2021 Tumor Marker   Patient's tumor was tested for the following markers: CA-125. Results of the tumor marker test revealed 45.6.   11/27/2021 Imaging   1. Overall stable to slightly improved ill-defined soft tissue density throughout the pelvis. 2. Slight interval decrease in size of the omental lesions. 3. Stable free pelvic fluid. 4. New surgical changes from a right hip replacement. 5. New nodular and somewhat triangular density at the left lung base is likely an area of atelectasis. 6. Aortic atherosclerosis.   12/24/2021 Tumor Marker   Patient's tumor was tested for the following markers: CA-125. Results of the tumor marker test revealed 39.6.   02/04/2022 Tumor Marker   Patient's tumor was tested for the following markers: CA-125. Results of the tumor marker test revealed 36.3.     PHYSICAL EXAMINATION: ECOG PERFORMANCE STATUS: 2 - Symptomatic, <50% confined to bed  Vitals:   02/04/22 1104  BP: 130/67  Pulse: 85  Resp: 16  Temp: 97.7 F (36.5 C)  SpO2: 100%   Filed Weights   02/04/22 1104  Weight: 141 lb 6.4 oz (64.1 kg)    GENERAL:alert, no distress and comfortable NEURO: alert & oriented x 3 with fluent speech, no focal motor/sensory deficits  LABORATORY DATA:  I have reviewed the data as listed    Component Value Date/Time   NA 142 02/04/2022 1034   NA 141 11/06/2016 1102   K 3.7 02/04/2022 1034   K 3.9  11/06/2016 1102   CL 107 02/04/2022 1034   CO2 29 02/04/2022 1034   CO2 27  11/06/2016 1102   GLUCOSE 99 02/04/2022 1034   GLUCOSE 130 11/06/2016 1102   BUN 25 (H) 02/04/2022 1034   BUN 18.0 11/06/2016 1102   CREATININE 1.17 (H) 02/04/2022 1034   CREATININE 1.42 (H) 02/21/2021 1414   CREATININE 1.3 (H) 11/06/2016 1102   CALCIUM 10.0 02/04/2022 1034   CALCIUM 10.2 11/06/2016 1102   PROT 7.1 02/04/2022 1034   PROT 7.1 11/06/2016 1102   ALBUMIN 3.6 02/04/2022 1034   ALBUMIN 3.7 11/06/2016 1102   AST 39 02/04/2022 1034   AST 52 (H) 11/06/2016 1102   ALT 21 02/04/2022 1034   ALT 58 (H) 11/06/2016 1102   ALKPHOS 123 02/04/2022 1034   ALKPHOS 159 (H) 11/06/2016 1102   BILITOT 0.6 02/04/2022 1034   BILITOT 0.53 11/06/2016 1102   GFRNONAA 46 (L) 02/04/2022 1034   GFRNONAA 34 (L) 02/21/2021 1414   GFRAA 40 (L) 02/21/2021 1414    No results found for: SPEP, UPEP  Lab Results  Component Value Date   WBC 4.5 02/04/2022   NEUTROABS 2.6 02/04/2022   HGB 9.2 (L) 02/04/2022   HCT 28.7 (L) 02/04/2022   MCV 97.3 02/04/2022   PLT 98 (L) 02/04/2022      Chemistry      Component Value Date/Time   NA 142 02/04/2022 1034   NA 141 11/06/2016 1102   K 3.7 02/04/2022 1034   K 3.9 11/06/2016 1102   CL 107 02/04/2022 1034   CO2 29 02/04/2022 1034   CO2 27 11/06/2016 1102   BUN 25 (H) 02/04/2022 1034   BUN 18.0 11/06/2016 1102   CREATININE 1.17 (H) 02/04/2022 1034   CREATININE 1.42 (H) 02/21/2021 1414   CREATININE 1.3 (H) 11/06/2016 1102      Component Value Date/Time   CALCIUM 10.0 02/04/2022 1034   CALCIUM 10.2 11/06/2016 1102   ALKPHOS 123 02/04/2022 1034   ALKPHOS 159 (H) 11/06/2016 1102   AST 39 02/04/2022 1034   AST 52 (H) 11/06/2016 1102   ALT 21 02/04/2022 1034   ALT 58 (H) 11/06/2016 1102   BILITOT 0.6 02/04/2022 1034   BILITOT 0.53 11/06/2016 1102

## 2022-02-05 NOTE — Assessment & Plan Note (Signed)
She has prolonged pancytopenia after each cycle of treatment ?We discussed risk bleeding with her low platelet count ?She does not need transfusion support ?Based on the trend of her CBC, I recommend spacing out her treatment to every 4-5 weeks and she is in agreement ?We will proceed with treatment without delay despite pancytopenia today ?

## 2022-02-05 NOTE — Assessment & Plan Note (Signed)
Her last imaging studies showed positive response to treatment ?Due to history of severe pancytopenia, we have made informed decision to space out her treatment to every 4-5 weeks and for her to receive carboplatin and Gemzar together and omit day 8 Gemzar ?She remained pancytopenic today ?We discussed the risk and benefits of continuing treatment and she is in agreement to proceed ?I plan to repeat imaging study next month ?Clinically, she continues to respond to treatment with improvement and normalization of tumor marker ?

## 2022-02-05 NOTE — Assessment & Plan Note (Signed)
She has stable chronic kidney disease stage III ?Observe closely ?Will adjust the dose of carboplatin accordingly ?

## 2022-02-10 ENCOUNTER — Encounter: Payer: Self-pay | Admitting: Adult Health

## 2022-02-11 ENCOUNTER — Telehealth: Payer: Self-pay

## 2022-02-11 MED ORDER — LANCETS MISC: 11 refills | Status: DC

## 2022-02-11 MED ORDER — GLUCOSE BLOOD VI STRP: ORAL_STRIP | 12 refills | Status: DC

## 2022-02-11 MED ORDER — BLOOD GLUCOSE MONITOR KIT: PACK | 12 refills | Status: DC

## 2022-02-11 NOTE — Telephone Encounter (Signed)
Refill request for glucometer and supplies. ?

## 2022-02-20 ENCOUNTER — Encounter: Payer: Self-pay | Admitting: Adult Health

## 2022-02-20 NOTE — Progress Notes (Signed)
Encounter for Annual Physical Exam  ? ? ?Assessment:  ? ?Encounter for Annual Physical Exam with abnormal findings ?Due annually  ?Health Maintenance reviewed ?Healthy lifestyle reviewed and goals set ? ?Atherosclerosis of aorta ([place]) - per CT 05/10/2021 ?Control blood pressure, cholesterol, glucose, increase exercise.  ? ?Hyperlipidemia associated with T2DM ([place]) ?Continue rosuvastatin; monitor closely on chemo; some LFT elevations ?LDL goal is <70 ?Continue low cholesterol diet and exercise.  ?Check lipid panel.  ? ?Diabetes with diabetic chronic kidney disease ([place]) ?Currently with slow weight loss and A1Cs in prediabetic range; off metformin, on PRN low dose glimepiride -take only if fasting 150+, needs rarely  ?Continue diet and exercise.  ?Perform daily foot/skin check, notify office of any concerning changes.  ?Check A1C ? 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 Hollie Salk, improved ?Off of metformin ?Increase fluids, avoid NSAIDS, monitor sugars, will monitor ? ?Diabetic neuropathy, painful (HCC)/ chemotherapy induced neuropathy ([place]) ?Check feet daily, gabapentin 300 mg PRN at night.  ? ?Diabetic retinopathy, bil ([place]) ?Control sugars, ophth following ? ?Malignant neoplasm of upper-inner quadrant of left breast in female, estrogen receptor positive ([place]) ?Being monitored by annual mammogram and oncology ? ?Ovarian cancer (HCC)/ Peritoneal metastasis ([place]) ?Chemotherapy followed by Dr. [name], has port, S/p resection 10/2020 ?Has routine CBC/CMP by oncology  ?She would like to live past the holidays ?Reviewed lifestyle for immune support; increase fresh fruits and veggies, beans, whole grain; minimal processed/red meat/animal products. Increase brisk walking.  ? ?Pancytopenia (HCC)/anemia in CKD/anemia in neoplastic disease ?Hem/onc following closely; ? ?Gout ?Continue allopurinol ?Diet discussed ?Check uric acid  ? ?GERD  ?Hx of; recently well controlled; continue meds ?Discussed diet, avoiding  triggers and other lifestyle changes ? ?Osteopenia ?- Repeat DEXA in 2 years ?- increase exercise, high calcium diet, continue vitamin D suppement ? ?Vit D def ?Continue to recommend supplementation for goal of 60-100 ?Check vitamin D level  ? ?Orders Placed This Encounter  ?Procedures  ? Magnesium  ? Lipid panel  ? TSH  ? Hemoglobin A1c  ? VITAMIN D 25 Hydroxy (Vit-D Deficiency, Fractures)  ? Uric acid  ? Microalbumin / creatinine urine ratio  ? Urinalysis, Routine w reflex microscopic  ? EKG 12-Lead  ? HM DIABETES FOOT EXAM  ? ? ?Over 30 minutes of exam, counseling, chart review, and critical decision making was performed ?Future Appointments  ?Date Time Provider [place]  ?03/05/2022  8:30 AM [facility] [place] FLUSH [facility] None  ?03/05/2022  9:30 AM [facility]-CT 2 [facility]CT [place]  ?03/07/2022 10:20 AM Heath Lark, MD [facility] None  ?03/07/2022 11:00 AM [facility] INFUSION [facility] None  ?03/21/2022 11:40 AM [facility] DIAG TOMO 1 [facility] GI-BREAST CE  ?03/25/2022  9:45 AM Adegoke, Seth Bake, RPH [facility] None  ?06/10/2022  9:30 AM Liane Comber, NP [facility] None  ?09/15/2022  9:30 AM [name], MD [facility] None  ?02/25/2023 10:00 AM Liane Comber, NP [facility] None  ? ? ?Plan:  ? ?During the course of the visit the patient was educated and counseled about appropriate screening and preventive services including:  ? ?Pneumococcal vaccine  ?Influenza vaccine ?Td vaccine ?Prevnar 13 ?Screening electrocardiogram ?Screening mammography ?Bone densitometry screening ?Colorectal cancer screening ?Diabetes screening ?Glaucoma screening ?Nutrition counseling  ?Advanced directives: given info/requested copies ? ? ? ?Subjective:  ? ?Kayla Baker is a 86 y.o. female who presents for CPE and follow up. She has Essential hypertension; Hyperlipidemia associated with type 2 diabetes mellitus ([place]); T2_NIDDM w/CKD3 ([place]); Vitamin D deficiency; Medication management; Diabetic neuropathy,  painful ([place]); Gout; History of left  breast cancer; Chemotherapy-induced peripheral neuropathy ([place]); Port catheter in place; CKD stage 3 due to type 2 diabetes mellitus ([place]); Edema of right lower leg; Ovarian cancer ([place]); Goals of care, counseling/discussion; Anemia in neoplastic disease; Pancytopenia, acquired ([place]); Cancer associated pain; Genetic testing; Deficiency anemia; Mild nonproliferative diabetic retinopathy of both eyes without macular edema associated with type 2 diabetes mellitus ([place]); Osteopenia; Aortic atherosclerosis ([place]) by Abd CT scan on 05/10/2021; Nodule of lower lobe of right lung; Poor dentition; Adhesive capsulitis of right shoulder; History of breast cancer; and Postoperative breast asymmetry on their problem list. ? ?She is married, no children, niece has new baby and enjoying seeing her. 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 She enjoys spending time with family.  ? ?She had fall in 10/2021, R femoral neck fracture and underwent hemiarthroplasty by Dr. Candee Furbish on 11/08/2021, had follow up this morning. She relates some tenderness lying on her side.  ? ?In 2021 she was unfortunately diagnosed with large R sided ovarian tumor with consequent RLE edema, urinary incontinence (wearing depends, improved), had port cath placed and started chemotherapy managed by Dr. Heath Lark, s/p resction by Dr. Everitt Amber at [facility] on 10/18/2020. Chemotherapy with some intolerance, pancytopenia/anemia that have required 2 transfusions and delayed between some treatments, also some elevated LFTs, however is responding with improved cancer markers and stable/mild improvement per CT 11/27/2021, has another upcoming 03/05/2022. Overall she reports she feels well and in good spirits.  ? ?Other significant current problems include hx/o Triple (+) Lt Breast Ca w/ Partial Lt Mastectomy  in April 2017 followed by Chemo tx 5-07/2016 then Radiation in Oct; she completed 4 years of anastrozole, was d/c'd  due to ovarian cancer. Last mammogram 03/2021, she has upcoming scheduled 03/21/2022.  ? ?BMI is Body mass index is 24.31 kg/m?., she has been working on diet, pushing fruits/vegetables, watching white carbs; she does have [employer] membership but hasn't been going since the pandemic. She is very active with housework and yardwork.  ?Wt Readings from Last 3 Encounters:  ?02/21/22 141 lb 9.6 oz (64.2 kg)  ?02/04/22 141 lb 6.4 oz (64.1 kg)  ?12/24/21 142 lb 3.2 oz (64.5 kg)  ? ?Her blood pressure has been controlled at home, today their BP is BP: 118/62 ?She does not workout. She denies chest pain, shortness of breath, dizziness.  ? ?She has aortic atherosclerosis per CT 05/2021.  ? ?She is on cholesterol medication (on zetia, hx of LFT elevation with statin, on rosuvastatin 5 mg three days a week) and denies myalgias. Her cholesterol is at goal. The cholesterol last visit was:   ?Lab Results  ?Component Value Date  ? CHOL 133 09/19/2021  ? HDL 49 (L) 09/19/2021  ? [place] 66 09/19/2021  ? TRIG 99 09/19/2021  ? CHOLHDL 2.7 09/19/2021  ? ?She has been working on diet and exercise for T2 diabetes (off of metformin due to CKD, glimepiride 1 mg PRN AM if fasting 150+), and denies foot ulcerations, hyperglycemia, hypoglycemia , increased appetite, nausea, polydipsia, polyuria, visual disturbances, vomiting and weight loss. She reports that her blood sugars have been running 91-121. She does have some mild neuropathy in her feet (also r/t chemotherapy), on gabapentin.    ?Last A1C in the office was:  ?Lab Results  ?Component Value Date  ? HGBA1C 6.6 (H) 09/19/2021  ? ?CKD III associated with T2DM; followed by Dr. Hollie Salk annually; last GFR:  ?Lab  Results  ?Component Value Date  ? EGFR 38 (L) 11/06/2016  ? EGFR 30 (L) 08/07/2016  ? EGFR 32 (L) 07/31/2016  ? ?Lab Results  ?Component Value Date  ? MICRALBCREAT 13 02/21/2021  ? ?Patient is on Vitamin D supplement, taking 5000 IU daily. ?Lab Results  ?Component Value Date  ? VD25OH 66  06/11/2021  ?   ?Patient is on allopurinol for gout and does not report a recent flare.  ?Lab Results  ?Component Value Date  ? LABURIC 5.3 06/11/2021  ? ?Hem/onc and nephrology also following chronic anemia

## 2022-02-21 ENCOUNTER — Encounter: Payer: Self-pay | Admitting: Adult Health

## 2022-02-21 ENCOUNTER — Other Ambulatory Visit: Payer: Self-pay

## 2022-02-21 ENCOUNTER — Ambulatory Visit (INDEPENDENT_AMBULATORY_CARE_PROVIDER_SITE_OTHER): Payer: PPO | Admitting: Adult Health

## 2022-02-21 VITALS — BP 118/62 | HR 88 | Temp 97.7°F | Ht 64.0 in | Wt 141.6 lb

## 2022-02-21 DIAGNOSIS — I10 Essential (primary) hypertension: Secondary | ICD-10-CM

## 2022-02-21 DIAGNOSIS — Z136 Encounter for screening for cardiovascular disorders: Secondary | ICD-10-CM

## 2022-02-21 DIAGNOSIS — I700 Atherosclerosis of aorta: Secondary | ICD-10-CM | POA: Diagnosis not present

## 2022-02-21 DIAGNOSIS — Z853 Personal history of malignant neoplasm of breast: Secondary | ICD-10-CM

## 2022-02-21 DIAGNOSIS — Z1329 Encounter for screening for other suspected endocrine disorder: Secondary | ICD-10-CM | POA: Diagnosis not present

## 2022-02-21 DIAGNOSIS — Z0000 Encounter for general adult medical examination without abnormal findings: Secondary | ICD-10-CM | POA: Diagnosis not present

## 2022-02-21 DIAGNOSIS — M1000 Idiopathic gout, unspecified site: Secondary | ICD-10-CM

## 2022-02-21 DIAGNOSIS — E1169 Type 2 diabetes mellitus with other specified complication: Secondary | ICD-10-CM | POA: Diagnosis not present

## 2022-02-21 DIAGNOSIS — G620 Drug-induced polyneuropathy: Secondary | ICD-10-CM

## 2022-02-21 DIAGNOSIS — D539 Nutritional anemia, unspecified: Secondary | ICD-10-CM

## 2022-02-21 DIAGNOSIS — S72001D Fracture of unspecified part of neck of right femur, subsequent encounter for closed fracture with routine healing: Secondary | ICD-10-CM | POA: Diagnosis not present

## 2022-02-21 DIAGNOSIS — Z79899 Other long term (current) drug therapy: Secondary | ICD-10-CM

## 2022-02-21 DIAGNOSIS — N183 Chronic kidney disease, stage 3 unspecified: Secondary | ICD-10-CM

## 2022-02-21 DIAGNOSIS — M8580 Other specified disorders of bone density and structure, unspecified site: Secondary | ICD-10-CM

## 2022-02-21 DIAGNOSIS — C569 Malignant neoplasm of unspecified ovary: Secondary | ICD-10-CM

## 2022-02-21 DIAGNOSIS — E559 Vitamin D deficiency, unspecified: Secondary | ICD-10-CM | POA: Diagnosis not present

## 2022-02-21 DIAGNOSIS — E113293 Type 2 diabetes mellitus with mild nonproliferative diabetic retinopathy without macular edema, bilateral: Secondary | ICD-10-CM

## 2022-02-21 DIAGNOSIS — E1140 Type 2 diabetes mellitus with diabetic neuropathy, unspecified: Secondary | ICD-10-CM

## 2022-02-21 DIAGNOSIS — Z0001 Encounter for general adult medical examination with abnormal findings: Secondary | ICD-10-CM

## 2022-02-21 DIAGNOSIS — D61818 Other pancytopenia: Secondary | ICD-10-CM

## 2022-02-21 DIAGNOSIS — E785 Hyperlipidemia, unspecified: Secondary | ICD-10-CM | POA: Diagnosis not present

## 2022-02-21 DIAGNOSIS — E1122 Type 2 diabetes mellitus with diabetic chronic kidney disease: Secondary | ICD-10-CM

## 2022-02-21 NOTE — Patient Instructions (Signed)
?  Kayla Baker , ?Thank you for taking time to come for your Annual Wellness Visit. I appreciate your ongoing commitment to your health goals. Please review the following plan we discussed and let me know if I can assist you in the future.  ? ?  ?This is a list of the screening recommended for you and due dates:  ?Health Maintenance  ?Topic Date Due  ? COVID-19 Vaccine (5 - Booster for Pfizer series) 08/06/2021  ? Urine Protein Check  02/21/2022  ? Zoster (Shingles) Vaccine (1 of 2) 09/20/2023*  ? Mammogram  03/20/2022  ? Hemoglobin A1C  03/20/2022  ? Eye exam for diabetics  09/18/2022  ? Complete foot exam   02/22/2023  ? Tetanus Vaccine  04/11/2030  ? Pneumonia Vaccine  Completed  ? Flu Shot  Completed  ? DEXA scan (bone density measurement)  Completed  ? HPV Vaccine  Aged Out  ?*Topic was postponed. The date shown is not the original due date.  ? ? ? ? ?Know what a healthy weight is for you (roughly BMI <25) and aim to maintain this ? ?Aim for 7+ servings of fruits and vegetables daily ? ?65-80+ fluid ounces of water or unsweet tea for healthy kidneys ? ?Limit to max 1 drink of alcohol per day; avoid smoking/tobacco ? ?Limit animal fats in diet for cholesterol and heart health - choose grass fed whenever available ? ?Avoid highly processed foods, and foods high in saturated/trans fats ? ?Aim for low stress - take time to unwind and care for your mental health ? ?Aim for 150 min of moderate intensity exercise weekly for heart health, and weights twice weekly for bone health ? ?Aim for 7-9 hours of sleep daily ? ? ? ?A great goal to work towards is aiming to get in a serving daily of some of the most nutritionally dense foods - G- BOMBS daily  ? ? ? ? ? ?

## 2022-02-22 ENCOUNTER — Encounter: Payer: Self-pay | Admitting: Adult Health

## 2022-02-22 LAB — URINALYSIS, ROUTINE W REFLEX MICROSCOPIC
Bilirubin Urine: NEGATIVE
Glucose, UA: NEGATIVE
Hgb urine dipstick: NEGATIVE
Ketones, ur: NEGATIVE
Leukocytes,Ua: NEGATIVE
Nitrite: NEGATIVE
Protein, ur: NEGATIVE
Specific Gravity, Urine: 1.015 (ref 1.001–1.035)
pH: 5.5 (ref 5.0–8.0)

## 2022-02-22 LAB — LIPID PANEL
Cholesterol: 117 mg/dL (ref <200)
HDL: 45 mg/dL — ABNORMAL LOW (ref >=50)
LDL Cholesterol (Calc): 53 mg/dL (calc)
Non-HDL Cholesterol (Calc): 72 mg/dL (calc) (ref <130)
Total CHOL/HDL Ratio: 2.6 (calc) (ref <5.0)
Triglycerides: 102 mg/dL (ref <150)

## 2022-02-22 LAB — URIC ACID: Uric Acid, Serum: 4.4 mg/dL (ref 2.5–7.0)

## 2022-02-22 LAB — VITAMIN D 25 HYDROXY (VIT D DEFICIENCY, FRACTURES): Vit D, 25-Hydroxy: 65 ng/mL (ref 30–100)

## 2022-02-22 LAB — HEMOGLOBIN A1C
Hgb A1c MFr Bld: 6.5 % of total Hgb — ABNORMAL HIGH (ref <5.7)
Mean Plasma Glucose: 140 mg/dL
eAG (mmol/L): 7.7 mmol/L

## 2022-02-22 LAB — MICROALBUMIN / CREATININE URINE RATIO
Creatinine, Urine: 86 mg/dL (ref 20–275)
Microalb, Ur: <0.2 mg/dL

## 2022-02-22 LAB — TSH: TSH: 2.78 mIU/L (ref 0.40–4.50)

## 2022-02-22 LAB — MAGNESIUM: Magnesium: 1.2 mg/dL — ABNORMAL LOW (ref 1.5–2.5)

## 2022-03-05 ENCOUNTER — Other Ambulatory Visit: Payer: Self-pay

## 2022-03-05 ENCOUNTER — Encounter (HOSPITAL_COMMUNITY): Payer: Self-pay

## 2022-03-05 ENCOUNTER — Inpatient Hospital Stay: Payer: PPO | Attending: Gynecologic Oncology

## 2022-03-05 ENCOUNTER — Telehealth: Payer: Self-pay

## 2022-03-05 ENCOUNTER — Ambulatory Visit (HOSPITAL_COMMUNITY)
Admission: RE | Admit: 2022-03-05 | Discharge: 2022-03-05 | Disposition: A | Discharge status: Home / Self Care | Payer: PPO | Source: Ambulatory Visit | Attending: Hematology and Oncology | Admitting: Hematology and Oncology

## 2022-03-05 DIAGNOSIS — C561 Malignant neoplasm of right ovary: Secondary | ICD-10-CM | POA: Insufficient documentation

## 2022-03-05 DIAGNOSIS — C50212 Malignant neoplasm of upper-inner quadrant of left female breast: Secondary | ICD-10-CM

## 2022-03-05 DIAGNOSIS — C569 Malignant neoplasm of unspecified ovary: Secondary | ICD-10-CM | POA: Diagnosis not present

## 2022-03-05 DIAGNOSIS — K668 Other specified disorders of peritoneum: Secondary | ICD-10-CM | POA: Insufficient documentation

## 2022-03-05 DIAGNOSIS — Z5111 Encounter for antineoplastic chemotherapy: Secondary | ICD-10-CM | POA: Insufficient documentation

## 2022-03-05 DIAGNOSIS — Z95828 Presence of other vascular implants and grafts: Secondary | ICD-10-CM

## 2022-03-05 DIAGNOSIS — R161 Splenomegaly, not elsewhere classified: Secondary | ICD-10-CM | POA: Diagnosis not present

## 2022-03-05 DIAGNOSIS — I739 Peripheral vascular disease, unspecified: Secondary | ICD-10-CM | POA: Diagnosis not present

## 2022-03-05 DIAGNOSIS — Z853 Personal history of malignant neoplasm of breast: Secondary | ICD-10-CM

## 2022-03-05 LAB — CMP (CANCER CENTER ONLY)
ALT: 15 U/L (ref 0–44)
AST: 31 U/L (ref 15–41)
Albumin: 3.5 g/dL (ref 3.5–5.0)
Alkaline Phosphatase: 125 U/L (ref 38–126)
Anion gap: 7 (ref 5–15)
BUN: 21 mg/dL (ref 8–23)
CO2: 29 mmol/L (ref 22–32)
Calcium: 9.4 mg/dL (ref 8.9–10.3)
Chloride: 108 mmol/L (ref 98–111)
Creatinine: 1.25 mg/dL — ABNORMAL HIGH (ref 0.44–1.00)
GFR, Estimated: 43 mL/min — ABNORMAL LOW (ref >60)
Glucose, Bld: 136 mg/dL — ABNORMAL HIGH (ref 70–99)
Potassium: 3.6 mmol/L (ref 3.5–5.1)
Sodium: 144 mmol/L (ref 135–145)
Total Bilirubin: 0.6 mg/dL (ref 0.3–1.2)
Total Protein: 6.8 g/dL (ref 6.5–8.1)

## 2022-03-05 LAB — CBC WITH DIFFERENTIAL (CANCER CENTER ONLY)
Abs Immature Granulocytes: 0.01 10*3/uL (ref 0.00–0.07)
Basophils Absolute: 0 10*3/uL (ref 0.0–0.1)
Basophils Relative: 0 %
Eosinophils Absolute: 0 10*3/uL (ref 0.0–0.5)
Eosinophils Relative: 1 %
HCT: 27.8 % — ABNORMAL LOW (ref 36.0–46.0)
Hemoglobin: 9 g/dL — ABNORMAL LOW (ref 12.0–15.0)
Immature Granulocytes: 0 %
Lymphocytes Relative: 29 %
Lymphs Abs: 0.8 10*3/uL (ref 0.7–4.0)
MCH: 31.8 pg (ref 26.0–34.0)
MCHC: 32.4 g/dL (ref 30.0–36.0)
MCV: 98.2 fL (ref 80.0–100.0)
Monocytes Absolute: 0.5 10*3/uL (ref 0.1–1.0)
Monocytes Relative: 18 %
Neutro Abs: 1.5 10*3/uL — ABNORMAL LOW (ref 1.7–7.7)
Neutrophils Relative %: 52 %
Platelet Count: 102 10*3/uL — ABNORMAL LOW (ref 150–400)
RBC: 2.83 MIL/uL — ABNORMAL LOW (ref 3.87–5.11)
RDW: 18.8 % — ABNORMAL HIGH (ref 11.5–15.5)
WBC Count: 2.9 10*3/uL — ABNORMAL LOW (ref 4.0–10.5)
nRBC: 0 % (ref 0.0–0.2)

## 2022-03-05 MED ORDER — IOHEXOL 300 MG/ML  SOLN
100.0000 mL | Freq: Once | INTRAMUSCULAR | Status: AC | PRN
Start: 2022-03-05 — End: 2022-03-05
  Administered 2022-03-05: 80 mL via INTRAVENOUS

## 2022-03-05 MED ORDER — SODIUM CHLORIDE 0.9% FLUSH
10.0000 mL | Freq: Once | INTRAVENOUS | Status: AC
  Administered 2022-03-05: 10 mL

## 2022-03-05 MED ORDER — HEPARIN SOD (PORK) LOCK FLUSH 100 UNIT/ML IV SOLN: 500.0000 [IU] | Freq: Once | INTRAVENOUS | Status: DC

## 2022-03-05 MED ORDER — SODIUM CHLORIDE (PF) 0.9 % IJ SOLN
INTRAMUSCULAR | Status: AC
  Filled 2022-03-05: qty 50

## 2022-03-05 MED ORDER — HEPARIN SOD (PORK) LOCK FLUSH 100 UNIT/ML IV SOLN
INTRAVENOUS | Status: AC
  Administered 2022-03-05: 500 [IU]
  Filled 2022-03-05: qty 5

## 2022-03-05 NOTE — Telephone Encounter (Signed)
Pt called and states she went to radiology today for CT, when the rad tech deaccessed her port he placed a regular bandaid. When pt got home she noticed the bandaid was saturated in blood as well as her bra and shirt. Pt asks if this is OK. She cleaned the sight with alcohol and states it is no longer bleeding. I described the port needle shape to pt and advised that the needle was likely pulled straight out instead of at a curve but if it continues to bleed, develops bruising, swelling, swollen neck or arm to call us back. Pt verbalized thanks and understanding.  ?

## 2022-03-05 NOTE — Telephone Encounter (Signed)
Pls call back again if bleeding ?We will monitor for now ?

## 2022-03-06 LAB — CA 125: Cancer Antigen (CA) 125: 56.2 U/mL — ABNORMAL HIGH (ref 0.0–38.1)

## 2022-03-07 ENCOUNTER — Inpatient Hospital Stay: Payer: PPO | Admitting: Hematology and Oncology

## 2022-03-07 ENCOUNTER — Other Ambulatory Visit: Payer: Self-pay

## 2022-03-07 ENCOUNTER — Telehealth: Payer: Self-pay

## 2022-03-07 ENCOUNTER — Other Ambulatory Visit: Payer: Self-pay | Admitting: Adult Health

## 2022-03-07 ENCOUNTER — Inpatient Hospital Stay: Payer: PPO

## 2022-03-07 ENCOUNTER — Encounter: Payer: Self-pay | Admitting: Hematology and Oncology

## 2022-03-07 DIAGNOSIS — Z5111 Encounter for antineoplastic chemotherapy: Secondary | ICD-10-CM | POA: Diagnosis not present

## 2022-03-07 DIAGNOSIS — C569 Malignant neoplasm of unspecified ovary: Secondary | ICD-10-CM

## 2022-03-07 DIAGNOSIS — E1122 Type 2 diabetes mellitus with diabetic chronic kidney disease: Secondary | ICD-10-CM

## 2022-03-07 DIAGNOSIS — N183 Chronic kidney disease, stage 3 unspecified: Secondary | ICD-10-CM

## 2022-03-07 DIAGNOSIS — C561 Malignant neoplasm of right ovary: Secondary | ICD-10-CM | POA: Diagnosis not present

## 2022-03-07 DIAGNOSIS — Z170 Estrogen receptor positive status [ER+]: Secondary | ICD-10-CM

## 2022-03-07 DIAGNOSIS — D61818 Other pancytopenia: Secondary | ICD-10-CM | POA: Diagnosis not present

## 2022-03-07 DIAGNOSIS — C50212 Malignant neoplasm of upper-inner quadrant of left female breast: Secondary | ICD-10-CM | POA: Diagnosis not present

## 2022-03-07 MED ORDER — DIPHENHYDRAMINE HCL 50 MG/ML IJ SOLN
25.0000 mg | Freq: Once | INTRAMUSCULAR | Status: AC
  Administered 2022-03-07: 25 mg via INTRAVENOUS
  Filled 2022-03-07: qty 1

## 2022-03-07 MED ORDER — SODIUM CHLORIDE 0.9 % IV SOLN
10.0000 mg | Freq: Once | INTRAVENOUS | Status: AC
  Administered 2022-03-07: 10 mg via INTRAVENOUS
  Filled 2022-03-07: qty 10

## 2022-03-07 MED ORDER — HEPARIN SOD (PORK) LOCK FLUSH 100 UNIT/ML IV SOLN
500.0000 [IU] | Freq: Once | INTRAVENOUS | Status: AC | PRN
  Administered 2022-03-07: 500 [IU]

## 2022-03-07 MED ORDER — PALONOSETRON HCL INJECTION 0.25 MG/5ML
0.2500 mg | Freq: Once | INTRAVENOUS | Status: AC
  Administered 2022-03-07: 0.25 mg via INTRAVENOUS
  Filled 2022-03-07: qty 5

## 2022-03-07 MED ORDER — SODIUM CHLORIDE 0.9% FLUSH
10.0000 mL | INTRAVENOUS | Status: DC | PRN
  Administered 2022-03-07: 10 mL

## 2022-03-07 MED ORDER — SODIUM CHLORIDE 0.9 % IV SOLN: Freq: Once | INTRAVENOUS | Status: AC

## 2022-03-07 MED ORDER — SODIUM CHLORIDE 0.9 % IV SOLN
500.0000 mg/m2 | Freq: Once | INTRAVENOUS | Status: AC
  Administered 2022-03-07: 836 mg via INTRAVENOUS
  Filled 2022-03-07: qty 21.99

## 2022-03-07 MED ORDER — SODIUM CHLORIDE 0.9 % IV SOLN
237.6000 mg | Freq: Once | INTRAVENOUS | Status: AC
  Administered 2022-03-07: 240 mg via INTRAVENOUS
  Filled 2022-03-07: qty 24

## 2022-03-07 MED ORDER — FAMOTIDINE IN NACL 20-0.9 MG/50ML-% IV SOLN
20.0000 mg | Freq: Once | INTRAVENOUS | Status: AC
  Administered 2022-03-07: 20 mg via INTRAVENOUS
  Filled 2022-03-07: qty 50

## 2022-03-07 MED ORDER — SODIUM CHLORIDE 0.9 % IV SOLN
150.0000 mg | Freq: Once | INTRAVENOUS | Status: AC
  Administered 2022-03-07: 150 mg via INTRAVENOUS
  Filled 2022-03-07: qty 150

## 2022-03-07 MED ORDER — LIDOCAINE-PRILOCAINE 2.5-2.5 % EX CREA: 1.0000 "application " | TOPICAL_CREAM | Freq: Every day | CUTANEOUS | 3 refills | Status: DC | PRN

## 2022-03-07 NOTE — Telephone Encounter (Signed)
Notified Patient of prior authorization approval for EMLA (Lidocaine-Prilocaine 2.5%) Cream. Medication is approved from 03/07/2022 through 06/05/2022. No other needs or concerns voiced at this time. ?

## 2022-03-07 NOTE — Progress Notes (Signed)
[facility] ?OFFICE PROGRESS NOTE ? ?Patient Care Team: ?[name], MD as PCP - General ?Macarthur Critchley, [place] as Referring Physician (Optometry) ?[name], MD as Consulting Physician (Dermatology) ?Latanya Maudlin, MD as Consulting Physician (Orthopedic Surgery) ?Inda Castle, MD (Inactive) as Consulting Physician (Gastroenterology) ?Awanda Mink Craige Cotta, RN as Oncology Nurse Navigator (Oncology) ?Heath Lark, MD as Consulting Physician (Hematology and Oncology) ?Madelon Lips, MD as Consulting Physician (Nephrology) ?Rush Landmark, [facility] as Pharmacist (Pharmacist) ? ?ASSESSMENT & PLAN:  ?Ovarian cancer ([place]) ?I have reviewed multiple imaging studies with the patient and her husband ?Overall, she had stable disease control with current prescribed chemotherapy once a month ?I recommend we continue treatment for few more months with plan to repeat imaging study in August ?She is in agreement ? ?Pancytopenia, acquired ([place]) ?She has prolonged pancytopenia after each cycle of treatment ?We discussed risk bleeding with her low platelet count ?She does not need transfusion support ?Based on the trend of her CBC, I recommend spacing out her treatment to every 4-5 weeks and she is in agreement ?We will proceed with treatment without delay despite pancytopenia recently ? ?CKD stage 3 due to type 2 diabetes mellitus ([place]) ?She has stable chronic kidney disease stage III ?Observe closely ?Will adjust the dose of carboplatin accordingly ? ?Hypomagnesemia ?She was found to have hypomagnesemia recently by her primary care doctor ?She is currently taking magnesium replacement ?Per patient request, I will start checking magnesium level in her future visits ? ?Orders Placed This Encounter  ?Procedures  ? Magnesium  ?  Standing Status:   Standing  ?  Number of Occurrences:   2  ?  Standing Expiration Date:   03/08/2023  ? ? ?All questions were answered. The patient knows to call the clinic with any problems, questions or  concerns. ?The total time spent in the appointment was 40 minutes encounter with patients including review of chart and various tests results, discussions about plan of care and coordination of care plan ?  ?Heath Lark, MD ?03/07/2022 11:02 AM ? ?INTERVAL HISTORY: ?Please see below for problem oriented charting. ?she returns for treatment follow-up with her husband ?She is on chemotherapy for recurrent ovarian cancer ?Since last time I saw her, she was started on magnesium supplement due to low magnesium ?She has mild fluid retention and is on diuretic ?Denies abdominal bloating or changes in bowel habits ?No recent infection, fever or chills ?Denies recent bleeding ? ?REVIEW OF SYSTEMS:   ?Constitutional: Denies fevers, chills or abnormal weight loss ?Eyes: Denies blurriness of vision ?Ears, nose, mouth, throat, and face: Denies mucositis or sore throat ?Respiratory: Denies cough, dyspnea or wheezes ?Cardiovascular: Denies palpitation, chest discomfort  ?Gastrointestinal:  Denies nausea, heartburn or change in bowel habits ?Skin: Denies abnormal skin rashes ?Lymphatics: Denies new lymphadenopathy or easy bruising ?Neurological:Denies numbness, tingling or new weaknesses ?Behavioral/Psych: Mood is stable, no new changes  ?All other systems were reviewed with the patient and are negative. ? ?I have reviewed the past medical history, past surgical history, social history and family history with the patient and they are unchanged from previous note. ? ?ALLERGIES:  is allergic to keflex [cephalexin], meloxicam, prednisone, premarin [estrogens conjugated], and trovan [alatrofloxacin]. ? ?MEDICATIONS:  ?Current Outpatient Medications  ?Medication Sig Dispense Refill  ? acetaminophen (TYLENOL) 325 MG tablet Take 325 mg by mouth every 6 (six) hours as needed for headache.    ? allopurinol (ZYLOPRIM) 300 MG tablet Take 1 tablet (300 mg total) by mouth daily. FOR  GOUT PREVENTION, 90 tablet 1  ? blood glucose meter kit and  supplies KIT Dispense based on patient and insurance preference. Use to check blood sugar once daily. Dx: E11.22, N18.30 1 each 12  ? Cholecalciferol (VITAMIN D3) 5000 units CAPS Take 5,000 Units by mouth daily.    ? cyanocobalamin 1000 MCG tablet Take 1,000 mcg by mouth daily.    ? ezetimibe (ZETIA) 10 MG tablet Patient knows to take by mouth  !   - Thanks (Patient taking differently: Take 10 mg by mouth daily.) 90 tablet 3  ? gabapentin (NEURONTIN) 300 MG capsule Take 1 capsule 4 x  /day for Pain 360 capsule 3  ? glimepiride (AMARYL) 1 MG tablet Take 1 tab by mouth in the morning if fasting is running 150+. Please do not take this medication if you are ill or not eating as this can cause a low blood sugar. (Patient taking differently: Take 1 mg by mouth See admin instructions. Take 1 tab by mouth daily as needed if fasting is running 150+. Please do not take this medication if you are ill or not eating as this can cause a low blood sugar.) 90 tablet 1  ? glucose blood test strip Use to test blood sugar once daily. Dx: E11.22, N18.30 100 each 12  ? hydrochlorothiazide (HYDRODIURIL) 25 MG tablet TAKE ONE TABLET BY MOUTH DAILY FOR FOR BLOOD PRESSURE AND FLUID RETENTION/ ANKLE SWELLING 90 tablet 1  ? Lancets MISC Use to test blood sugar once daily. Dx: E11.22, N18.30 200 each 11  ? lidocaine-prilocaine (EMLA) cream Apply 1 application. topically daily as needed (for port access). 30 g 3  ? loratadine (CLARITIN) 10 MG tablet Take 10 mg by mouth daily as needed for allergies.    ? Magnesium 250 MG TABS Take 250 mg by mouth every other day.    ? metoprolol succinate (TOPROL-XL) 25 MG 24 hr tablet Take  1 tablet  Daily  for BP       /TAKE 1 TABLET BY MOUTH DAILY FOR BLOOD PRESSURE (Patient taking differently: Take 25 mg by mouth daily.) 90 tablet 3  ? ondansetron (ZOFRAN) 8 MG tablet Take 1 tablet (8 mg total) by mouth 2 (two) times daily as needed for refractory nausea / vomiting. Start on day 3 after carboplatin chemo.  30 tablet 1  ? PFIZER-BIONT COVID-19 VAC-TRIS SUSP injection     ? polyethylene glycol (MIRALAX) 17 g packet Take 17 g by mouth 2 (two) times daily. 17 grams in 6 oz of favorite drink twice a day until bowel movement.  LAXITIVE.  Restart if two days since last bowel movement 14 packet 0  ? rosuvastatin (CRESTOR) 5 MG tablet TAKE 1 TABLET BY MOUTH IN THE EVENING THREE TIMES WEEKLY FOR CHOLESTEROL 36 tablet 3  ? trolamine salicylate (ASPERCREME) 10 % cream Apply 1 application topically as needed for muscle pain (Use if you still feel tingles in feet in the morning after taking Gabapentin).    ? vitamin C (ASCORBIC ACID) 500 MG tablet Take 500 mg by mouth every evening.     ? ?No current facility-administered medications for this visit.  ? ?Facility-Administered Medications Ordered in Other Visits  ?Medication Dose Route Frequency Provider Last Rate Last Admin  ? CARBOplatin (PARAPLATIN) 240 mg in sodium chloride 0.9 % 100 mL chemo infusion  240 mg Intravenous Once [name], Kaleesi Guyton, MD      ? dexamethasone (DECADRON) 10 mg in sodium chloride 0.9 % 50 mL IVPB  10 mg Intravenous Once Heath Lark, MD      ? diphenhydrAMINE (BENADRYL) injection 25 mg  25 mg Intravenous Once [name], Fatina Sprankle, MD      ? famotidine (PEPCID) IVPB 20 mg premix  20 mg Intravenous Once [name], Bryannah Boston, MD      ? fosaprepitant (EMEND) 150 mg in sodium chloride 0.9 % 145 mL IVPB  150 mg Intravenous Once [name], Alpheus Stiff, MD      ? gemcitabine (GEMZAR) 836 mg in sodium chloride 0.9 % 250 mL chemo infusion  500 mg/m2 (Treatment Plan Recorded) Intravenous Once [name], Tzivia Oneil, MD      ? heparin lock flush 100 unit/mL  500 Units Intracatheter Once PRN Heath Lark, MD      ? palonosetron (ALOXI) injection 0.25 mg  0.25 mg Intravenous Once [name], Johnthan Axtman, MD      ? sodium chloride flush (NS) 0.9 % injection 10 mL  10 mL Intracatheter PRN Heath Lark, MD      ? ? ?SUMMARY OF ONCOLOGIC HISTORY: ?Oncology History Overview Note  ?HRD positive ?  ?History of left breast cancer   ?02/26/2016 Initial Diagnosis  ? Left breast biopsy 11:00 position: invasive ductal carcinoma with DCIS, ER 90%, PR 10%, HER-2 negative, Ki-67 30%, grade 2, 2.2 cm palpable lesion T2 N0 stage II a clinical stage

## 2022-03-07 NOTE — Assessment & Plan Note (Signed)
She was found to have hypomagnesemia recently by her primary care doctor ?She is currently taking magnesium replacement ?Per patient request, I will start checking magnesium level in her future visits ?

## 2022-03-07 NOTE — Assessment & Plan Note (Signed)
I have reviewed multiple imaging studies with the patient and her husband ?Overall, she had stable disease control with current prescribed chemotherapy once a month ?I recommend we continue treatment for few more months with plan to repeat imaging study in August ?She is in agreement ?

## 2022-03-07 NOTE — Assessment & Plan Note (Signed)
She has prolonged pancytopenia after each cycle of treatment ?We discussed risk bleeding with her low platelet count ?She does not need transfusion support ?Based on the trend of her CBC, I recommend spacing out her treatment to every 4-5 weeks and she is in agreement ?We will proceed with treatment without delay despite pancytopenia recently ?

## 2022-03-07 NOTE — Patient Instructions (Signed)
[facility]  Discharge Instructions: ?Thank you for choosing [facility] to provide your oncology and hematology care.  ? ?If you have a lab appointment with the [facility], please go directly to the [facility] and check in at the registration area. ?  ?Wear comfortable clothing and clothing appropriate for easy access to any Portacath or PICC line.  ? ?We strive to give you quality time with your provider. You may need to reschedule your appointment if you arrive late (15 or more minutes).  Arriving late affects you and other patients whose appointments are after yours.  Also, if you miss three or more appointments without notifying the office, you may be dismissed from the clinic at the provider?s discretion.    ?  ?For prescription refill requests, have your pharmacy contact our office and allow 72 hours for refills to be completed.   ? ?Today you received the following chemotherapy and/or immunotherapy agents: Carboplatin, Gemcitabine.    ?  ?To help prevent nausea and vomiting after your treatment, we encourage you to take your nausea medication as directed. ? ?BELOW ARE SYMPTOMS THAT SHOULD BE REPORTED IMMEDIATELY: ?*FEVER GREATER THAN 100.4 F (38 ?C) OR HIGHER ?*CHILLS OR SWEATING ?*NAUSEA AND VOMITING THAT IS NOT CONTROLLED WITH YOUR NAUSEA MEDICATION ?*UNUSUAL SHORTNESS OF BREATH ?*UNUSUAL BRUISING OR BLEEDING ?*URINARY PROBLEMS (pain or burning when urinating, or frequent urination) ?*BOWEL PROBLEMS (unusual diarrhea, constipation, pain near the anus) ?TENDERNESS IN MOUTH AND THROAT WITH OR WITHOUT PRESENCE OF ULCERS (sore throat, sores in mouth, or a toothache) ?UNUSUAL RASH, SWELLING OR PAIN  ?UNUSUAL VAGINAL DISCHARGE OR ITCHING  ? ?Items with * indicate a potential emergency and should be followed up as soon as possible or go to the Emergency Department if any problems should occur. ? ?Please show the CHEMOTHERAPY ALERT CARD or IMMUNOTHERAPY ALERT CARD  at check-in to the Emergency Department and triage nurse. ? ?Should you have questions after your visit or need to cancel or reschedule your appointment, please contact [facility]  Dept: [phone]  and follow the prompts.  Office hours are 8:00 a.m. to 4:30 p.m. Monday - Friday. Please note that voicemails left after 4:00 p.m. may not be returned until the following business day.  We are closed weekends and major holidays. You have access to a nurse at all times for urgent questions. Please call the main number to the clinic Dept: [phone] and follow the prompts. ? ? ?For any non-urgent questions, you may also contact your provider using MyChart. We now offer e-Visits for anyone 40 and older to request care online for non-urgent symptoms. For details visit [email or web address]. ?  ?Also download the MyChart app! Go to the app store, search "MyChart", open the app, select [facility], and log in with your MyChart username and password. ? ?Due to Covid, a mask is required upon entering the hospital/clinic. If you do not have a mask, one will be given to you upon arrival. For doctor visits, patients may have 1 support Kayla Baker aged 84 or older with them. For treatment visits, patients cannot have anyone with them due to current Covid guidelines and our immunocompromised population.  ? ?

## 2022-03-07 NOTE — Assessment & Plan Note (Signed)
She has stable chronic kidney disease stage III ?Observe closely ?Will adjust the dose of carboplatin accordingly ?

## 2022-03-08 ENCOUNTER — Encounter: Payer: Self-pay | Admitting: Adult Health

## 2022-03-14 ENCOUNTER — Telehealth: Payer: Self-pay | Admitting: *Deleted

## 2022-03-14 NOTE — Telephone Encounter (Signed)
FMLA paperwork for Kayla Baker daughter, [name] successfully returned to [employer] of [facility] fax Number [phone].  Original mailed to patient address on file. ?[name] ?[place] [address] ?No further activity needed or performed by this nurse. ?

## 2022-03-21 ENCOUNTER — Other Ambulatory Visit: Payer: Self-pay | Admitting: Internal Medicine

## 2022-03-21 ENCOUNTER — Ambulatory Visit
Admission: RE | Admit: 2022-03-21 | Discharge: 2022-03-21 | Disposition: A | Discharge status: Home / Self Care | Payer: PPO | Source: Ambulatory Visit | Attending: Internal Medicine | Admitting: Internal Medicine

## 2022-03-21 ENCOUNTER — Telehealth: Payer: Self-pay

## 2022-03-21 DIAGNOSIS — Z853 Personal history of malignant neoplasm of breast: Secondary | ICD-10-CM

## 2022-03-21 DIAGNOSIS — Z1231 Encounter for screening mammogram for malignant neoplasm of breast: Secondary | ICD-10-CM | POA: Diagnosis not present

## 2022-03-21 NOTE — Telephone Encounter (Signed)
LM-03/21/22-Calling pt.to confirm CP visit scheduled for 03/25/22 at 9:45AM and to complete precall questions. Unable to reach patient. [place] X1. ? ? ?Total time spent:2 min. ?

## 2022-03-24 NOTE — Telephone Encounter (Signed)
Pt. Returned call. Confirmed CP visit for 4/25 at 9:45am and went over precall questions w/ pt. ? ?Total time spent: 8 min. ? ?Pre-Call Questions ?Are you able to connect with Patient: ?Yes ?Confirmed appointment date/time with patient/caregiver?: ?Yes ?Date/time of the appointment: ?4/25 at 9:45am ?Visit type: ?Clinic ?Patient/Caregiver instructed to bring medications to appointment: ?Yes ?What, if any, problems do you have getting your medications from the pharmacy?: ?None ?What is your top health concern to discuss at your upcoming visit?: ?No health concerns at this time. ?Have you seen any other providers since your last visit?: ?Yes ?Details: ?Visit #1: ?09/19/21-Ashley Corbett, NP- Pt. presented for AWV. No medication changes noted. ? ?Visit #2: ?09/30/21-Dr. Melford Aase- No info available. ? ?Visit #3: ?01/14/22-Dana Demetra Shiner, NP- Pt. presented via virtual visit due to Covid-19. ?STARTED ? Molnupiravir 800 mg Oral 2 times daily ?STOPPED ? HYDROcodone-Acetaminophen 5-325 MG 1-2 tablets Oral Every 6 hours PRN ? Prochlorperazine Maleate 10 mg Oral Every 6 hours PRN ? ?Visit #4: ?02/21/22-Ashley Corbett, NP- Pt. presented for Annual Physical Exam and f/u. No medication changes noted. ? ?Were there Specialist Visits since last visit with the Pharmacist?: ?YesVisit #1: ?10/07/21-Gorsuch, Ni, MD (Hematology and Oncology)- Pt. presented for f/u. No medication changes noted. ? ?Visit #2: ?10/29/22-Haverstock, Jennefer Bravo (Dermatology)- No information available. ?Visit #3: ?11/05/21-Gorsuch, Ni, MD (Hematology and Oncology)- Pt. presented for f/u. Provider recommended modifying her tx further by simplifying her chemotherapy regimen to carboplatin and gemcitabine on day 1 every 3 weeks. No medication changes noted. ? ?Visit #4: ?11/12/21-Marchwiany, Virgina Norfolk and [facility]- No information available. ? ?Visit #5: ?11/21/21-Varkey, Dax T (Orthopedic Surgery)- No information available. ? ?Visit #6: ?11/29/21-Gorsuch, Ni,  MD (Hematology and Oncology)- Pt. presented for f/u. No medication changes noted. ?Additional Visits: ?12/19/21-Marchwiany, Virgina Norfolk (Orthopedic Surgery)-No information available. ?12/24/21-Gorsuch, Ni, MD (Hematology and Oncology)-Pt. presented for f/u. ?STOPPED ? Enoxaparin Sodium 40 mg Subcutaneous Every 24 hours ? Mupirocin 2 % 1 application. Nasal 2 times daily ? ?02/04/22-Gorsuch, Ni, MD (Hematology and Oncology)- Pt. presented for f/u. No medication changes noted. ? ?03/07/22-Gorsuch, Ni, MD (Hematology and Oncology)- Pt. presented for f/u. ?MODIFIED ? Lidocaine-Prilocaine 7.0-4.8 % 1 application. Topical Daily PRN Apply to affected area once ? ?11/07/21-11/10/21-ED to admission- Pt. presented for fall w/ c/o of right hip pain post fall. Pt. underwent hip arthroplasty on 12/9. ?STARTED ? enoxaparin 40 MG/0.4ML injection (40 mg total) into the skin daily ? ? ?

## 2022-03-25 ENCOUNTER — Other Ambulatory Visit: Payer: Self-pay | Admitting: Internal Medicine

## 2022-03-25 ENCOUNTER — Ambulatory Visit: Payer: PPO | Admitting: Pharmacy Technician

## 2022-03-25 ENCOUNTER — Other Ambulatory Visit: Payer: Self-pay | Admitting: Adult Health

## 2022-03-25 DIAGNOSIS — E1169 Type 2 diabetes mellitus with other specified complication: Secondary | ICD-10-CM

## 2022-03-25 DIAGNOSIS — M1000 Idiopathic gout, unspecified site: Secondary | ICD-10-CM

## 2022-03-25 DIAGNOSIS — Z79899 Other long term (current) drug therapy: Secondary | ICD-10-CM

## 2022-03-25 DIAGNOSIS — N183 Chronic kidney disease, stage 3 unspecified: Secondary | ICD-10-CM

## 2022-03-25 DIAGNOSIS — I10 Essential (primary) hypertension: Secondary | ICD-10-CM

## 2022-03-25 DIAGNOSIS — N1832 Chronic kidney disease, stage 3b: Secondary | ICD-10-CM

## 2022-03-25 MED ORDER — ALLOPURINOL 100 MG PO TABS: 100.0000 mg | ORAL_TABLET | Freq: Every day | ORAL | 3 refills | Status: DC

## 2022-03-25 NOTE — Progress Notes (Signed)
Follow Up Pharmacist Visit  ? Kayla Baker, Kayla Baker [id number] ?78 years, Female  DOB: [birth year]  M: [phone] ?Care Team:  Bethena Roys  Strategy, Team ?__________________________________________________ ?Patient's Chronic Conditions: Hypertension (HTN), Chronic Kidney Disease (CKD), Hyperlipidemia/Dyslipidemia (HLD), Osteopenia or Osteoporosis, Gout, Edema, Anemia, Other, Diabetes (DM) ?List Other Conditions (separated by comma): Vitamin D deficiency, Diabetic neuropathy, Diabetic retinopathy, Adhesive capsulitis of right shoulder, Pancytopenia,Ovarian cancer, Nodule of lower lobe of right lung, Postoperative breast asymmetry, hypomagnesemia   ? ?Summary for PCP:  ?1. A1c may be increased by monthly Dexamethasone added to chemo regimen. ?2. Decrease Allopurinol to 164m daily. ?. ?Doctor and Hospital Visits ?Were there PCP Visits since last visit with the Pharmacist?: Yes ?Visit #1: 09/19/21-Ashley Corbett, NP- Pt. presented for AWV. No medication changes noted. ?Visit #2: 09/30/21-Dr. MMelford Aase No info available. ?Visit #3: 01/14/22-Dana MDemetra Shiner NP- Pt. presented via virtual visit due to Covid-19. ?STARTED ? ?Molnupiravir 800 mg Oral 2 times daily ? ?STOPPED ? ?HYDROcodone-Acetaminophen 5-325 MG 1-2 tablets Oral Every 6 hours PRN ?Prochlorperazine Maleate 10 mg Oral Every 6 hours PRN ? ?Visit #4: 02/21/22-Ashley CWilma Flavin NP- Pt. presented for Annual Physical Exam and f/u. No medication changes noted. ?Were there Specialist Visits since last visit with the Pharmacist?: Yes ?Visit #1: 10/07/21-Gorsuch, Ni, MD (Hematology and Oncology)- Pt. presented for f/u. No medication changes noted. ?Visit #2: 10/29/22-Haverstock, CJennefer Bravo(Dermatology)- No information available. ?Visit #3: 11/05/21-Gorsuch, NErnst Spell MD (Hematology and Oncology)- Pt. presented for f/u. Provider recommended modifying her tx further by simplifying her chemotherapy regimen to carboplatin and gemcitabine on day 1 every 3 weeks. No  medication changes noted. ?Visit #4: 11/12/21-Marchwiany, DVirgina [facility]and [facility]- No information available. ?Visit #5: 11/21/21-Varkey, Dax T (Orthopedic Surgery)- No information available. ?Visit #6: 11/29/21-Gorsuch, Ni, MD (Hematology and Oncology)- Pt. presented for f/u. No medication changes noted. ?Additional Visits: 12/19/21-Marchwiany, DVirgina Norfolk(Orthopedic Surgery)-No information available. ? ?12/24/21-Gorsuch, Ni, MD (Hematology and Oncology)-Pt. presented for f/u. ?STOPPED ? ?Enoxaparin Sodium 40 mg Subcutaneous Every 24 hours ?Mupirocin 2 % 1 application. Nasal 2 times daily ? ? ?02/04/22-Gorsuch, Ni, MD (Hematology and Oncology)- Pt. presented for f/u. No medication changes noted. ? ?03/07/22-Gorsuch, Ni, MD (Hematology and Oncology)- Pt. presented for f/u. ?MODIFIED ? ?Lidocaine-Prilocaine 23.3-2.9% 1 application. Topical Daily PRN Apply to affected area once ? ?Was there a Hospital Visit in last 30 days?: No ?Were there other Hospital Visits since last visit with the Pharmacist?: Yes ?Visit #1: 11/07/21-11/10/21-ED to admission- Pt. presented for fall w/ c/o of right hip pain post fall. Pt. underwent hip arthroplasty on 12/9. ?STARTED ? ?enoxaparin 40 MG/0.4ML injection (40 mg total) into the skin daily ?.Marland Kitchen?Medication Information ?Have there been any medication changes from PCP or Specialist since last visit with the Pharmacist?: Yes ?Details: STARTED ? ?Molnupiravir 800 mg Oral 2 times daily ?enoxaparin 40 MG/0.4ML injection (40 mg total) into the skin daily ? ?MODIFIED ? ?Lidocaine-Prilocaine 25.1-8.8% 1 application. Topical Daily PRN Apply to affected area once ? ?STOPPED ? ?HYDROcodone-Acetaminophen 5-325 MG 1-2 tablets Oral Every 6 hours PRN ?Prochlorperazine Maleate 10 mg Oral Every 6 hours PRN ?Enoxaparin Sodium 40 mg Subcutaneous Every 24 hours ?Mupirocin 2 % 1 application. Nasal 2 times daily ? ?Are there any Medication adherence gaps (beyond 5 days past due)?: Yes ?Details:  ?Glimepiride  117m 07/29/20 (>5 days gap) 90DS-take only if fasting 150+, needs rarely ? ?Medication adherence rates for the STAR rating drugs:  ?Hydrochlorothiazide 2517m4/5/23 90DS ?Ezetimibe 48m86m/26/23 90DS ?Glimepiride  78m- 07/29/20 90DS -take only if fasting 150+, needs rarely ? ?List Patient's current Care Gaps: Need Breast Cancer Screening ?.Marland Kitchen?Engagement Notes ?MVanetta Shawl ?HC Chart/ CP prep: ?[facility] ?. ? ?Pre-Call Questions ?Are you able to connect with Patient: Yes ?Confirmed appointment date/time with patient/caregiver?: Yes ?Date/time of the appointment: 4/25 at 9:45am ?Visit type: Clinic ?Patient/Caregiver instructed to bring medications to appointment: Yes ?What, if any, problems do you have getting your medications from the pharmacy?: None ?What is your top health concern to discuss at your upcoming visit?: No health concerns at this time. ? ?Subjective Information ?Current BP: 133/62 ?Current HR: 86 ?taken on: 03/07/2022 ?Weight: 146 ?BMI: 25.13 ?Last GFR: 43 ?taken on: 03/05/2022 ?Visit Completed on: 03/25/2022 ?Why did the patient present?: CCM Follow up ?Lifestyle habits such as diet and exercise?: Likes to walk ?Alcohol, tobacco, and illicit drug usage?: None ?What is the patient?s sleep pattern?: No sleep issues ?How many hours per night does patient typically sleep?: 8-9 ?Patient pleased with health care they are receiving?: Yes ?Any additional demeanor/mood notes?: Very sweet lady presenting for CCM follow up in person. She brings all medications in today. No financial barriers and uses a pill box to keep up with medications. ?SDOH: Accountable Health Communities Health-Related Social Needs Screening Tool ?([email or web address] ?SDOH questions were documented and reviewed (EMR or [employer]) within the past 3 months?: No ?What is your living situation today? (ref #1): I have a steady place to live ?Think about the place you live. Do  you have problems with any of the following? (ref #2): None of the above ?Within the past 12 months, you worried that your food would run out before you got money to buy more (ref #3): Never true ?Within the past 12 months, the food you bought just didn't last and you didn't have money to get more (ref #4): Never true ?In the past 12 months, has lack of reliable transportation kept you from medical appointments, meetings, work or from getting things needed for daily living? (ref #5): No ?In the past 12 months, has the electric, gas, oil, or water company threatened to shut off services in your home? (ref #6): No ?How often does anyone, including family and friends, physically hurt you? (ref #7): Never (1) ?How often does anyone, including family and friends, insult or talk down to you? (ref #8): Never (1) ?How often does anyone, including friends and family, threaten you with harm? (ref #9): Never (1) ?How often does anyone, including family and friends, scream or curse at you? (ref #10): Never (1) ?. ?Hypertension (HTN) ?Discussed with patient today?: Yes ?Is patient able to obtain BP reading today?: No ?Goal: <130/80 mmHG ?Is Patient checking BP at home?: Yes ?Patient home BP readings are ranging: Did not bring log ?How often does patient miss taking their blood pressure medications?: None ?Has patient experienced hypotension, dizziness, falls or bradycardia?: No ?Check present secondary causes (below) for HTN: CKD ?Does Patient use RPM device?: No ?BP RPM device: Does patient qualify?: No ?We discussed: DASH diet:  following a diet emphasizing fruits and vegetables and low-fat dairy products along with whole grains, fish, poultry, and nuts. Reducing red meats and sugars., Proper Home BP Measurement, Hypertension pathophysiology, complications, treatment goals, and management with patient for 10-15 minutes, Increasing movement, Increasing exercise (walking, biking, swimming) to a goal of 30 minutes per day, as  able based on current activity level and health or as directed by your healthcare provider., Contacting PCP office  for signs and symptoms of high or low blood pressure (hypotension, dizziness, falls, headaches, edem

## 2022-03-30 DIAGNOSIS — E1169 Type 2 diabetes mellitus with other specified complication: Secondary | ICD-10-CM | POA: Diagnosis not present

## 2022-03-30 DIAGNOSIS — I10 Essential (primary) hypertension: Secondary | ICD-10-CM | POA: Diagnosis not present

## 2022-03-30 DIAGNOSIS — K219 Gastro-esophageal reflux disease without esophagitis: Secondary | ICD-10-CM | POA: Diagnosis not present

## 2022-04-07 ENCOUNTER — Inpatient Hospital Stay (HOSPITAL_BASED_OUTPATIENT_CLINIC_OR_DEPARTMENT_OTHER): Payer: PPO | Admitting: Hematology and Oncology

## 2022-04-07 ENCOUNTER — Other Ambulatory Visit: Payer: Self-pay

## 2022-04-07 ENCOUNTER — Encounter: Payer: Self-pay | Admitting: Hematology and Oncology

## 2022-04-07 ENCOUNTER — Inpatient Hospital Stay: Payer: PPO | Attending: Gynecologic Oncology

## 2022-04-07 ENCOUNTER — Inpatient Hospital Stay: Payer: PPO

## 2022-04-07 VITALS — BP 137/61 | HR 84 | Temp 97.6°F | Resp 16 | Ht 64.0 in | Wt 147.8 lb

## 2022-04-07 DIAGNOSIS — Z881 Allergy status to other antibiotic agents status: Secondary | ICD-10-CM | POA: Insufficient documentation

## 2022-04-07 DIAGNOSIS — Z888 Allergy status to other drugs, medicaments and biological substances status: Secondary | ICD-10-CM | POA: Diagnosis not present

## 2022-04-07 DIAGNOSIS — D61818 Other pancytopenia: Secondary | ICD-10-CM | POA: Insufficient documentation

## 2022-04-07 DIAGNOSIS — Z79811 Long term (current) use of aromatase inhibitors: Secondary | ICD-10-CM | POA: Insufficient documentation

## 2022-04-07 DIAGNOSIS — C786 Secondary malignant neoplasm of retroperitoneum and peritoneum: Secondary | ICD-10-CM | POA: Diagnosis not present

## 2022-04-07 DIAGNOSIS — Z853 Personal history of malignant neoplasm of breast: Secondary | ICD-10-CM

## 2022-04-07 DIAGNOSIS — C50212 Malignant neoplasm of upper-inner quadrant of left female breast: Secondary | ICD-10-CM

## 2022-04-07 DIAGNOSIS — E1122 Type 2 diabetes mellitus with diabetic chronic kidney disease: Secondary | ICD-10-CM | POA: Insufficient documentation

## 2022-04-07 DIAGNOSIS — N183 Chronic kidney disease, stage 3 unspecified: Secondary | ICD-10-CM | POA: Insufficient documentation

## 2022-04-07 DIAGNOSIS — C569 Malignant neoplasm of unspecified ovary: Secondary | ICD-10-CM

## 2022-04-07 DIAGNOSIS — Z5111 Encounter for antineoplastic chemotherapy: Secondary | ICD-10-CM | POA: Insufficient documentation

## 2022-04-07 DIAGNOSIS — Z79899 Other long term (current) drug therapy: Secondary | ICD-10-CM | POA: Diagnosis not present

## 2022-04-07 DIAGNOSIS — Z95828 Presence of other vascular implants and grafts: Secondary | ICD-10-CM

## 2022-04-07 DIAGNOSIS — I700 Atherosclerosis of aorta: Secondary | ICD-10-CM | POA: Diagnosis not present

## 2022-04-07 DIAGNOSIS — C561 Malignant neoplasm of right ovary: Secondary | ICD-10-CM | POA: Diagnosis not present

## 2022-04-07 DIAGNOSIS — Z90721 Acquired absence of ovaries, unilateral: Secondary | ICD-10-CM | POA: Diagnosis not present

## 2022-04-07 LAB — CBC WITH DIFFERENTIAL (CANCER CENTER ONLY)
Abs Immature Granulocytes: 0.01 10*3/uL (ref 0.00–0.07)
Basophils Absolute: 0 10*3/uL (ref 0.0–0.1)
Basophils Relative: 0 %
Eosinophils Absolute: 0 10*3/uL (ref 0.0–0.5)
Eosinophils Relative: 1 %
HCT: 28.3 % — ABNORMAL LOW (ref 36.0–46.0)
Hemoglobin: 9.1 g/dL — ABNORMAL LOW (ref 12.0–15.0)
Immature Granulocytes: 0 %
Lymphocytes Relative: 29 %
Lymphs Abs: 0.8 10*3/uL (ref 0.7–4.0)
MCH: 32.4 pg (ref 26.0–34.0)
MCHC: 32.2 g/dL (ref 30.0–36.0)
MCV: 100.7 fL — ABNORMAL HIGH (ref 80.0–100.0)
Monocytes Absolute: 0.4 10*3/uL (ref 0.1–1.0)
Monocytes Relative: 14 %
Neutro Abs: 1.5 10*3/uL — ABNORMAL LOW (ref 1.7–7.7)
Neutrophils Relative %: 56 %
Platelet Count: 85 10*3/uL — ABNORMAL LOW (ref 150–400)
RBC: 2.81 MIL/uL — ABNORMAL LOW (ref 3.87–5.11)
RDW: 18.4 % — ABNORMAL HIGH (ref 11.5–15.5)
WBC Count: 2.8 10*3/uL — ABNORMAL LOW (ref 4.0–10.5)
nRBC: 0 % (ref 0.0–0.2)

## 2022-04-07 LAB — CMP (CANCER CENTER ONLY)
ALT: 14 U/L (ref 0–44)
AST: 36 U/L (ref 15–41)
Albumin: 3.4 g/dL — ABNORMAL LOW (ref 3.5–5.0)
Alkaline Phosphatase: 112 U/L (ref 38–126)
Anion gap: 6 (ref 5–15)
BUN: 24 mg/dL — ABNORMAL HIGH (ref 8–23)
CO2: 28 mmol/L (ref 22–32)
Calcium: 9.6 mg/dL (ref 8.9–10.3)
Chloride: 108 mmol/L (ref 98–111)
Creatinine: 1.38 mg/dL — ABNORMAL HIGH (ref 0.44–1.00)
GFR, Estimated: 38 mL/min — ABNORMAL LOW (ref >60)
Glucose, Bld: 230 mg/dL — ABNORMAL HIGH (ref 70–99)
Potassium: 3.5 mmol/L (ref 3.5–5.1)
Sodium: 142 mmol/L (ref 135–145)
Total Bilirubin: 0.5 mg/dL (ref 0.3–1.2)
Total Protein: 6.8 g/dL (ref 6.5–8.1)

## 2022-04-07 LAB — MAGNESIUM: Magnesium: 1.3 mg/dL — ABNORMAL LOW (ref 1.7–2.4)

## 2022-04-07 MED ORDER — MAGNESIUM OXIDE -MG SUPPLEMENT 400 (240 MG) MG PO TABS: 400.0000 mg | ORAL_TABLET | Freq: Every day | ORAL | 3 refills | Status: DC

## 2022-04-07 MED ORDER — SODIUM CHLORIDE 0.9 % IV SOLN
10.0000 mg | Freq: Once | INTRAVENOUS | Status: AC
  Administered 2022-04-07: 10 mg via INTRAVENOUS
  Filled 2022-04-07: qty 10

## 2022-04-07 MED ORDER — SODIUM CHLORIDE 0.9 % IV SOLN: Freq: Once | INTRAVENOUS | Status: AC

## 2022-04-07 MED ORDER — SODIUM CHLORIDE 0.9 % IV SOLN
150.0000 mg | Freq: Once | INTRAVENOUS | Status: AC
  Administered 2022-04-07: 150 mg via INTRAVENOUS
  Filled 2022-04-07: qty 150

## 2022-04-07 MED ORDER — FAMOTIDINE IN NACL 20-0.9 MG/50ML-% IV SOLN
20.0000 mg | Freq: Once | INTRAVENOUS | Status: AC
  Administered 2022-04-07: 20 mg via INTRAVENOUS
  Filled 2022-04-07: qty 50

## 2022-04-07 MED ORDER — SODIUM CHLORIDE 0.9% FLUSH
10.0000 mL | INTRAVENOUS | Status: DC | PRN
  Administered 2022-04-07: 10 mL

## 2022-04-07 MED ORDER — PALONOSETRON HCL INJECTION 0.25 MG/5ML
0.2500 mg | Freq: Once | INTRAVENOUS | Status: AC
  Administered 2022-04-07: 0.25 mg via INTRAVENOUS
  Filled 2022-04-07: qty 5

## 2022-04-07 MED ORDER — DIPHENHYDRAMINE HCL 50 MG/ML IJ SOLN
25.0000 mg | Freq: Once | INTRAMUSCULAR | Status: AC
  Administered 2022-04-07: 25 mg via INTRAVENOUS
  Filled 2022-04-07: qty 1

## 2022-04-07 MED ORDER — SODIUM CHLORIDE 0.9% FLUSH
10.0000 mL | Freq: Once | INTRAVENOUS | Status: AC
  Administered 2022-04-07: 10 mL

## 2022-04-07 MED ORDER — SODIUM CHLORIDE 0.9 % IV SOLN
500.0000 mg/m2 | Freq: Once | INTRAVENOUS | Status: AC
  Administered 2022-04-07: 836 mg via INTRAVENOUS
  Filled 2022-04-07: qty 21.99

## 2022-04-07 MED ORDER — HEPARIN SOD (PORK) LOCK FLUSH 100 UNIT/ML IV SOLN
500.0000 [IU] | Freq: Once | INTRAVENOUS | Status: AC | PRN
  Administered 2022-04-07: 500 [IU]

## 2022-04-07 MED ORDER — SODIUM CHLORIDE 0.9 % IV SOLN
224.8000 mg | Freq: Once | INTRAVENOUS | Status: AC
  Administered 2022-04-07: 220 mg via INTRAVENOUS
  Filled 2022-04-07: qty 22

## 2022-04-07 NOTE — Assessment & Plan Note (Signed)
She was found to have hypomagnesemia recently by her primary care doctor ?She is on very low-dose magnesium replacement therapy ?I recommend magnesium oxide 400 mg daily and she is in agreement ?

## 2022-04-07 NOTE — Progress Notes (Signed)
[place] ?OFFICE PROGRESS NOTE ? ?Patient Care Team: ?[name], MD as PCP - General ?Macarthur Critchley, [place] as Referring Physician (Optometry) ?[name], MD as Consulting Physician (Dermatology) ?Latanya Maudlin, MD as Consulting Physician (Orthopedic Surgery) ?Inda Castle, MD (Inactive) as Consulting Physician (Gastroenterology) ?Awanda Mink Craige Cotta, RN as Oncology Nurse Navigator (Oncology) ?Heath Lark, MD as Consulting Physician (Hematology and Oncology) ?Madelon Lips, MD as Consulting Physician (Nephrology) ?Carleene Mains, [facility] (Pharmacist) ? ?ASSESSMENT & PLAN:  ?Ovarian cancer ([place]) ?Her last imaging study showed stable disease control with current prescribed chemotherapy once a month ?I recommend we continue treatment for few more months with plan to repeat imaging study in August ?She is in agreement ? ?Pancytopenia, acquired ([place]) ?She has prolonged pancytopenia after each cycle of treatment ?We discussed risk bleeding with her low platelet count ?She does not need transfusion support ?Based on the trend of her CBC, I recommend spacing out her treatment to every 5 weeks and she is in agreement ?We will proceed with treatment without delay despite pancytopenia recently ? ?Hypomagnesemia ?She was found to have hypomagnesemia recently by her primary care doctor ?She is on very low-dose magnesium replacement therapy ?I recommend magnesium oxide 400 mg daily and she is in agreement ? ?CKD stage 3 due to type 2 diabetes mellitus ([place]) ?She has stable chronic kidney disease stage III ?Observe closely ?Will adjust the dose of carboplatin accordingly ? ?Orders Placed This Encounter  ?Procedures  ? CA 125  ?  Standing Status:   Standing  ?  Number of Occurrences:   11  ?  Standing Expiration Date:   04/08/2023  ? ? ?All questions were answered. The patient knows to call the clinic with any problems, questions or concerns. ?The total time spent in the appointment was 30 minutes encounter with  patients including review of chart and various tests results, discussions about plan of care and coordination of care plan ?  ?Heath Lark, MD ?04/07/2022 10:38 AM ? ?INTERVAL HISTORY: ?Please see below for problem oriented charting. ?she returns for treatment follow-up ?She denies recent increased bruising or bleeding ?Denies abdominal pain or changes in bowel habits ?No recent infection, fever or chills ? ?REVIEW OF SYSTEMS:   ?Constitutional: Denies fevers, chills or abnormal weight loss ?Eyes: Denies blurriness of vision ?Ears, nose, mouth, throat, and face: Denies mucositis or sore throat ?Respiratory: Denies cough, dyspnea or wheezes ?Cardiovascular: Denies palpitation, chest discomfort or lower extremity swelling ?Gastrointestinal:  Denies nausea, heartburn or change in bowel habits ?Skin: Denies abnormal skin rashes ?Lymphatics: Denies new lymphadenopathy or easy bruising ?Neurological:Denies numbness, tingling or new weaknesses ?Behavioral/Psych: Mood is stable, no new changes  ?All other systems were reviewed with the patient and are negative. ? ?I have reviewed the past medical history, past surgical history, social history and family history with the patient and they are unchanged from previous note. ? ?ALLERGIES:  is allergic to keflex [cephalexin], meloxicam, prednisone, premarin [estrogens conjugated], and trovan [alatrofloxacin]. ? ?MEDICATIONS:  ?Current Outpatient Medications  ?Medication Sig Dispense Refill  ? magnesium oxide (MAG-OX) 400 (240 Mg) MG tablet Take 1 tablet (400 mg total) by mouth daily. 30 tablet 3  ? acetaminophen (TYLENOL) 325 MG tablet Take 325 mg by mouth every 6 (six) hours as needed for headache.    ? allopurinol (ZYLOPRIM) 100 MG tablet Take 1 tablet (100 mg total) by mouth daily. FOR GOUT PREVENTION, 90 tablet 3  ? blood glucose meter kit and supplies KIT Dispense based  on patient and insurance preference. Use to check blood sugar once daily. Dx: E11.22, N18.30 1 each 12  ?  Cholecalciferol (VITAMIN D3) 5000 units CAPS Take 5,000 Units by mouth daily.    ? cyanocobalamin 1000 MCG tablet Take 1,000 mcg by mouth daily.    ? ezetimibe (ZETIA) 10 MG tablet Patient knows to take by mouth  !   - Thanks (Patient taking differently: Take 10 mg by mouth daily.) 90 tablet 3  ? gabapentin (NEURONTIN) 300 MG capsule Take 1 capsule 4 x  /day for Pain 360 capsule 3  ? glimepiride (AMARYL) 1 MG tablet Take 1 tab by mouth in the morning if fasting is running 150+. Please do not take this medication if you are ill or not eating as this can cause a low blood sugar. (Patient taking differently: Take 1 mg by mouth See admin instructions. Take 1 tab by mouth daily as needed if fasting is running 150+. Please do not take this medication if you are ill or not eating as this can cause a low blood sugar.) 90 tablet 1  ? glucose blood test strip Use to test blood sugar once daily. Dx: E11.22, N18.30 100 each 12  ? hydrochlorothiazide (HYDRODIURIL) 25 MG tablet TAKE ONE TABLET BY MOUTH DAILY FOR FOR BLOOD PRESSURE AND FLUID RETENTION/ ANKLE SWELLING 90 tablet 1  ? Lancets MISC Use to test blood sugar once daily. Dx: E11.22, N18.30 200 each 11  ? lidocaine-prilocaine (EMLA) cream Apply 1 application. topically daily as needed (for port access). 30 g 3  ? loratadine (CLARITIN) 10 MG tablet Take 10 mg by mouth daily as needed for allergies.    ? metoprolol succinate (TOPROL-XL) 25 MG 24 hr tablet Take  1 tablet  Daily  for BP       /TAKE 1 TABLET BY MOUTH DAILY FOR BLOOD PRESSURE (Patient taking differently: Take 25 mg by mouth daily.) 90 tablet 3  ? ondansetron (ZOFRAN) 8 MG tablet Take 1 tablet (8 mg total) by mouth 2 (two) times daily as needed for refractory nausea / vomiting. Start on day 3 after carboplatin chemo. 30 tablet 1  ? PFIZER-BIONT COVID-19 VAC-TRIS SUSP injection     ? polyethylene glycol (MIRALAX) 17 g packet Take 17 g by mouth 2 (two) times daily. 17 grams in 6 oz of favorite drink twice a day  until bowel movement.  LAXITIVE.  Restart if two days since last bowel movement 14 packet 0  ? rosuvastatin (CRESTOR) 5 MG tablet TAKE 1 TABLET BY MOUTH IN THE EVENING THREE TIMES WEEKLY FOR CHOLESTEROL 36 tablet 3  ? trolamine salicylate (ASPERCREME) 10 % cream Apply 1 application topically as needed for muscle pain (Use if you still feel tingles in feet in the morning after taking Gabapentin).    ? vitamin C (ASCORBIC ACID) 500 MG tablet Take 500 mg by mouth every evening.     ? ?No current facility-administered medications for this visit.  ? ?Facility-Administered Medications Ordered in Other Visits  ?Medication Dose Route Frequency Provider Last Rate Last Admin  ? CARBOplatin (PARAPLATIN) 220 mg in sodium chloride 0.9 % 250 mL chemo infusion  220 mg Intravenous Once [name], Darus Hershman, MD      ? dexamethasone (DECADRON) 10 mg in sodium chloride 0.9 % 50 mL IVPB  10 mg Intravenous Once [name], Kharee Lesesne, MD      ? famotidine (PEPCID) IVPB 20 mg premix  20 mg Intravenous Once Heath Lark, MD      ?  fosaprepitant (EMEND) 150 mg in sodium chloride 0.9 % 145 mL IVPB  150 mg Intravenous Once [name], Elmyra Banwart, MD      ? gemcitabine (GEMZAR) 836 mg in sodium chloride 0.9 % 250 mL chemo infusion  500 mg/m2 (Treatment Plan Recorded) Intravenous Once [name], Karolyna Bianchini, MD      ? heparin lock flush 100 unit/mL  500 Units Intracatheter Once PRN [name], Luisfernando Brightwell, MD      ? sodium chloride flush (NS) 0.9 % injection 10 mL  10 mL Intracatheter PRN Heath Lark, MD      ? ? ?SUMMARY OF ONCOLOGIC HISTORY: ?Oncology History Overview Note  ?HRD positive ?  ?History of left breast cancer  ?02/26/2016 Initial Diagnosis  ? Left breast biopsy 11:00 position: invasive ductal carcinoma with DCIS, ER 90%, PR 10%, HER-2 negative, Ki-67 30%, grade 2, 2.2 cm palpable lesion T2 N0 stage II a clinical stage ? ?  ?03/18/2016 Surgery  ? Left lumpectomy: Invasive ductal carcinoma, grade 2, 6.3 cm, with high-grade DCIS, margins negative, 0/4 lymph nodes negative, ER 90%, via  10%, HER-2 negative ratio 0.97, Ki-67 30%, T3 N0 stage IIB ? ?  ?03/25/2016 Procedure  ? Genetic testing is negative for pathogenic mutations within any of the 20 Genes on the breast/ovarian cancer panel ? ?  ?5

## 2022-04-07 NOTE — Assessment & Plan Note (Signed)
She has stable chronic kidney disease stage III ?Observe closely ?Will adjust the dose of carboplatin accordingly ?

## 2022-04-07 NOTE — Patient Instructions (Signed)
[facility]  Discharge Instructions: ?Thank you for choosing [facility] to provide your oncology and hematology care.  ? ?If you have a lab appointment with the [facility], please go directly to the [facility] and check in at the registration area. ?  ?Wear comfortable clothing and clothing appropriate for easy access to any Portacath or PICC line.  ? ?We strive to give you quality time with your provider. You may need to reschedule your appointment if you arrive late (15 or more minutes).  Arriving late affects you and other patients whose appointments are after yours.  Also, if you miss three or more appointments without notifying the office, you may be dismissed from the clinic at the provider?s discretion.    ?  ?For prescription refill requests, have your pharmacy contact our office and allow 72 hours for refills to be completed.   ? ?Today you received the following chemotherapy and/or immunotherapy agents: Carboplatin, Gemcitabine.    ?  ?To help prevent nausea and vomiting after your treatment, we encourage you to take your nausea medication as directed. ? ?BELOW ARE SYMPTOMS THAT SHOULD BE REPORTED IMMEDIATELY: ?*FEVER GREATER THAN 100.4 F (38 ?C) OR HIGHER ?*CHILLS OR SWEATING ?*NAUSEA AND VOMITING THAT IS NOT CONTROLLED WITH YOUR NAUSEA MEDICATION ?*UNUSUAL SHORTNESS OF BREATH ?*UNUSUAL BRUISING OR BLEEDING ?*URINARY PROBLEMS (pain or burning when urinating, or frequent urination) ?*BOWEL PROBLEMS (unusual diarrhea, constipation, pain near the anus) ?TENDERNESS IN MOUTH AND THROAT WITH OR WITHOUT PRESENCE OF ULCERS (sore throat, sores in mouth, or a toothache) ?UNUSUAL RASH, SWELLING OR PAIN  ?UNUSUAL VAGINAL DISCHARGE OR ITCHING  ? ?Items with * indicate a potential emergency and should be followed up as soon as possible or go to the Emergency Department if any problems should occur. ? ?Please show the CHEMOTHERAPY ALERT CARD or IMMUNOTHERAPY ALERT CARD  at check-in to the Emergency Department and triage nurse. ? ?Should you have questions after your visit or need to cancel or reschedule your appointment, please contact [facility]  Dept: [phone]  and follow the prompts.  Office hours are 8:00 a.m. to 4:30 p.m. Monday - Friday. Please note that voicemails left after 4:00 p.m. may not be returned until the following business day.  We are closed weekends and major holidays. You have access to a nurse at all times for urgent questions. Please call the main number to the clinic Dept: [phone] and follow the prompts. ? ? ?For any non-urgent questions, you may also contact your provider using MyChart. We now offer e-Visits for anyone 63 and older to request care online for non-urgent symptoms. For details visit [email or web address]. ?  ?Also download the MyChart app! Go to the app store, search "MyChart", open the app, select [facility], and log in with your MyChart username and password. ? ?Due to Covid, a mask is required upon entering the hospital/clinic. If you do not have a mask, one will be given to you upon arrival. For doctor visits, patients may have 1 support Shruti Arrey aged 8 or older with them. For treatment visits, patients cannot have anyone with them due to current Covid guidelines and our immunocompromised population.  ? ?

## 2022-04-07 NOTE — Assessment & Plan Note (Signed)
Her last imaging study showed stable disease control with current prescribed chemotherapy once a month ?I recommend we continue treatment for few more months with plan to repeat imaging study in August ?She is in agreement ?

## 2022-04-07 NOTE — Assessment & Plan Note (Signed)
She has prolonged pancytopenia after each cycle of treatment ?We discussed risk bleeding with her low platelet count ?She does not need transfusion support ?Based on the trend of her CBC, I recommend spacing out her treatment to every 5 weeks and she is in agreement ?We will proceed with treatment without delay despite pancytopenia recently ?

## 2022-04-08 LAB — CA 125: Cancer Antigen (CA) 125: 65.5 U/mL — ABNORMAL HIGH (ref 0.0–38.1)

## 2022-04-15 ENCOUNTER — Telehealth: Payer: Self-pay

## 2022-04-15 NOTE — Telephone Encounter (Signed)
LM-04/15/22-Calling pt. To assess med adherence to cholesterol medication. Spoke to pt. And asked pt. Is she still taking her Ezetimibe and rosuvastatin. Pt. Stated she takes it daily, refills her meds on time, and has no gaps/time when she stopped taking the medications. Pt. Stated she does not have any issues getting her medications from her pharmacy. ? ?Total time spent:10 min. ?

## 2022-04-24 ENCOUNTER — Other Ambulatory Visit: Payer: Self-pay | Admitting: Internal Medicine

## 2022-04-24 DIAGNOSIS — E1169 Type 2 diabetes mellitus with other specified complication: Secondary | ICD-10-CM

## 2022-04-29 ENCOUNTER — Other Ambulatory Visit: Payer: Self-pay | Admitting: Pharmacist

## 2022-04-29 DIAGNOSIS — I129 Hypertensive chronic kidney disease with stage 1 through stage 4 chronic kidney disease, or unspecified chronic kidney disease: Secondary | ICD-10-CM | POA: Diagnosis not present

## 2022-04-29 DIAGNOSIS — D61818 Other pancytopenia: Secondary | ICD-10-CM | POA: Diagnosis not present

## 2022-04-29 DIAGNOSIS — C569 Malignant neoplasm of unspecified ovary: Secondary | ICD-10-CM | POA: Diagnosis not present

## 2022-04-29 DIAGNOSIS — N1832 Chronic kidney disease, stage 3b: Secondary | ICD-10-CM | POA: Diagnosis not present

## 2022-04-29 DIAGNOSIS — E1122 Type 2 diabetes mellitus with diabetic chronic kidney disease: Secondary | ICD-10-CM | POA: Diagnosis not present

## 2022-05-12 ENCOUNTER — Encounter: Payer: Self-pay | Admitting: Hematology and Oncology

## 2022-05-12 ENCOUNTER — Inpatient Hospital Stay: Payer: PPO

## 2022-05-12 ENCOUNTER — Inpatient Hospital Stay: Payer: PPO | Attending: Gynecologic Oncology

## 2022-05-12 ENCOUNTER — Inpatient Hospital Stay (HOSPITAL_BASED_OUTPATIENT_CLINIC_OR_DEPARTMENT_OTHER): Payer: PPO | Admitting: Hematology and Oncology

## 2022-05-12 ENCOUNTER — Other Ambulatory Visit: Payer: Self-pay

## 2022-05-12 VITALS — BP 128/66 | HR 84 | Temp 97.9°F | Resp 18 | Ht 64.0 in | Wt 143.4 lb

## 2022-05-12 DIAGNOSIS — C561 Malignant neoplasm of right ovary: Secondary | ICD-10-CM | POA: Diagnosis not present

## 2022-05-12 DIAGNOSIS — N183 Chronic kidney disease, stage 3 unspecified: Secondary | ICD-10-CM | POA: Diagnosis not present

## 2022-05-12 DIAGNOSIS — Z5111 Encounter for antineoplastic chemotherapy: Secondary | ICD-10-CM | POA: Diagnosis not present

## 2022-05-12 DIAGNOSIS — R197 Diarrhea, unspecified: Secondary | ICD-10-CM | POA: Insufficient documentation

## 2022-05-12 DIAGNOSIS — C50212 Malignant neoplasm of upper-inner quadrant of left female breast: Secondary | ICD-10-CM

## 2022-05-12 DIAGNOSIS — D61818 Other pancytopenia: Secondary | ICD-10-CM

## 2022-05-12 DIAGNOSIS — Z853 Personal history of malignant neoplasm of breast: Secondary | ICD-10-CM

## 2022-05-12 DIAGNOSIS — C569 Malignant neoplasm of unspecified ovary: Secondary | ICD-10-CM

## 2022-05-12 DIAGNOSIS — Z79899 Other long term (current) drug therapy: Secondary | ICD-10-CM | POA: Insufficient documentation

## 2022-05-12 DIAGNOSIS — Z95828 Presence of other vascular implants and grafts: Secondary | ICD-10-CM

## 2022-05-12 DIAGNOSIS — E1122 Type 2 diabetes mellitus with diabetic chronic kidney disease: Secondary | ICD-10-CM | POA: Diagnosis not present

## 2022-05-12 LAB — CMP (CANCER CENTER ONLY)
ALT: 15 U/L (ref 0–44)
AST: 35 U/L (ref 15–41)
Albumin: 3.5 g/dL (ref 3.5–5.0)
Alkaline Phosphatase: 103 U/L (ref 38–126)
Anion gap: 5 (ref 5–15)
BUN: 24 mg/dL — ABNORMAL HIGH (ref 8–23)
CO2: 30 mmol/L (ref 22–32)
Calcium: 9.9 mg/dL (ref 8.9–10.3)
Chloride: 107 mmol/L (ref 98–111)
Creatinine: 1.19 mg/dL — ABNORMAL HIGH (ref 0.44–1.00)
GFR, Estimated: 45 mL/min — ABNORMAL LOW (ref >60)
Glucose, Bld: 192 mg/dL — ABNORMAL HIGH (ref 70–99)
Potassium: 3.2 mmol/L — ABNORMAL LOW (ref 3.5–5.1)
Sodium: 142 mmol/L (ref 135–145)
Total Bilirubin: 0.6 mg/dL (ref 0.3–1.2)
Total Protein: 6.5 g/dL (ref 6.5–8.1)

## 2022-05-12 LAB — CBC WITH DIFFERENTIAL (CANCER CENTER ONLY)
Abs Immature Granulocytes: 0.06 10*3/uL (ref 0.00–0.07)
Basophils Absolute: 0 10*3/uL (ref 0.0–0.1)
Basophils Relative: 0 %
Eosinophils Absolute: 0 10*3/uL (ref 0.0–0.5)
Eosinophils Relative: 1 %
HCT: 28.8 % — ABNORMAL LOW (ref 36.0–46.0)
Hemoglobin: 9.6 g/dL — ABNORMAL LOW (ref 12.0–15.0)
Immature Granulocytes: 2 %
Lymphocytes Relative: 20 %
Lymphs Abs: 0.8 10*3/uL (ref 0.7–4.0)
MCH: 32.9 pg (ref 26.0–34.0)
MCHC: 33.3 g/dL (ref 30.0–36.0)
MCV: 98.6 fL (ref 80.0–100.0)
Monocytes Absolute: 0.4 10*3/uL (ref 0.1–1.0)
Monocytes Relative: 10 %
Neutro Abs: 2.8 10*3/uL (ref 1.7–7.7)
Neutrophils Relative %: 67 %
Platelet Count: 84 10*3/uL — ABNORMAL LOW (ref 150–400)
RBC: 2.92 MIL/uL — ABNORMAL LOW (ref 3.87–5.11)
RDW: 17.1 % — ABNORMAL HIGH (ref 11.5–15.5)
WBC Count: 4.1 10*3/uL (ref 4.0–10.5)
nRBC: 0 % (ref 0.0–0.2)

## 2022-05-12 LAB — MAGNESIUM: Magnesium: 1.1 mg/dL — ABNORMAL LOW (ref 1.7–2.4)

## 2022-05-12 MED ORDER — SODIUM CHLORIDE 0.9% FLUSH
10.0000 mL | Freq: Once | INTRAVENOUS | Status: AC
  Administered 2022-05-12: 10 mL

## 2022-05-12 MED ORDER — SODIUM CHLORIDE 0.9 % IV SOLN: 6.0000 g | Freq: Once | INTRAVENOUS | Status: DC

## 2022-05-12 MED ORDER — MAGNESIUM SULFATE 2 GM/50ML IV SOLN
2.0000 g | INTRAVENOUS | Status: AC
  Administered 2022-05-12 (×3): 2 g via INTRAVENOUS
  Filled 2022-05-12 (×3): qty 50

## 2022-05-12 MED ORDER — SODIUM CHLORIDE 0.9 % IV SOLN: Freq: Once | INTRAVENOUS | Status: AC

## 2022-05-12 MED ORDER — HEPARIN SOD (PORK) LOCK FLUSH 100 UNIT/ML IV SOLN
500.0000 [IU] | Freq: Once | INTRAVENOUS | Status: AC
  Administered 2022-05-12: 500 [IU]

## 2022-05-12 NOTE — Assessment & Plan Note (Signed)
She has stable chronic kidney disease stage III Observe closely Will adjust the dose of carboplatin accordingly

## 2022-05-12 NOTE — Assessment & Plan Note (Signed)
Her last imaging study showed stable disease control with current prescribed chemotherapy once a month However, in time, she is developing more pancytopenia She can only now tolerate chemotherapy roughly once every 6 weeks Due to persistent pancytopenia and feeling unwell, I recommend we defer her chemotherapy until next week Today, we will focus on IV magnesium support We will schedule CT imaging next month for further follow-up and objective assessment of response to treatment

## 2022-05-12 NOTE — Assessment & Plan Note (Signed)
She has severe hypomagnesemia, multifactorial She tolerated oral magnesium supplement poorly I recommend discontinuation of oral magnesium I recommend IV magnesium infusion today

## 2022-05-12 NOTE — Progress Notes (Signed)
Per Dr. [name], no chemotherapy treatment today pt. to receive Magnesium 6 mg, magnesium level 1.1 mg/dL.

## 2022-05-12 NOTE — Progress Notes (Signed)
[place] OFFICE PROGRESS NOTE  Patient Care Team: [name], MD as PCP - Kinnie Scales, OD as Referring Physician (Optometry) [name], MD as Consulting Physician (Dermatology) Latanya Maudlin, MD as Consulting Physician (Orthopedic Surgery) Inda Castle, MD (Inactive) as Consulting Physician (Gastroenterology) Heath Lark, MD as Consulting Physician (Hematology and Oncology) Madelon Lips, MD as Consulting Physician (Nephrology) Carleene Mains, [facility] (Pharmacist)  ASSESSMENT & PLAN:  Ovarian cancer [facility]) Her last imaging study showed stable disease control with current prescribed chemotherapy once a month However, in time, she is developing more pancytopenia She can only now tolerate chemotherapy roughly once every 6 weeks Due to persistent pancytopenia and feeling unwell, I recommend we defer her chemotherapy until next week Today, we will focus on IV magnesium support We will schedule CT imaging next month for further follow-up and objective assessment of response to treatment  Hypomagnesemia She has severe hypomagnesemia, multifactorial She tolerated oral magnesium supplement poorly I recommend discontinuation of oral magnesium I recommend IV magnesium infusion today   Pancytopenia, acquired ([place]) She has prolonged pancytopenia after each cycle of treatment We discussed risk bleeding with her low platelet count She does not need transfusion support Based on the trend of her CBC, I recommend spacing out her treatment to every 6 weeks and she is in agreement We will defer treatment today and reschedule to next week  CKD stage 3 due to type 2 diabetes mellitus ([place]) She has stable chronic kidney disease stage III Observe closely Will adjust the dose of carboplatin accordingly  Diarrhea The cause of this is multifactorial, likely exacerbated by recent oral magnesium supplement I recommend discontinuation of oral magnesium supplement  and to take Imodium as needed  Orders Placed This Encounter  Procedures   CT ABDOMEN PELVIS W CONTRAST    Standing Status:   Future    Standing Expiration Date:   05/13/2023    Order Specific Question:   If indicated for the ordered procedure, I authorize the administration of contrast media per Radiology protocol    Answer:   Yes    Order Specific Question:   Preferred imaging location?    Answer:   [facility]    Order Specific Question:   Radiology Contrast Protocol - do NOT remove file path    Answer:   [email or web address]    All questions were answered. The patient knows to call the clinic with any problems, questions or concerns. The total time spent in the appointment was 40 minutes encounter with patients including review of chart and various tests results, discussions about plan of care and coordination of care plan   Heath Lark, MD 05/12/2022 10:34 AM  INTERVAL HISTORY: Please see below for problem oriented charting. she returns for treatment follow-up with her husband She felt weak and have lost weight due to frequent diarrhea She started taking magnesium supplement due to low magnesium level She denies recent bleeding No recent infection  REVIEW OF SYSTEMS:   Constitutional: Denies fevers, chills  Eyes: Denies blurriness of vision Ears, nose, mouth, throat, and face: Denies mucositis or sore throat Respiratory: Denies cough, dyspnea or wheezes Cardiovascular: Denies palpitation, chest discomfort or lower extremity swelling Skin: Denies abnormal skin rashes Lymphatics: Denies new lymphadenopathy or easy bruising Neurological:Denies numbness, tingling or new weaknesses Behavioral/Psych: Mood is stable, no new changes  All other systems were reviewed with the patient and are negative.  I have reviewed the past medical history, past surgical history, social history and  family history with the patient and they are unchanged  from previous note.  ALLERGIES:  is allergic to keflex [cephalexin], meloxicam, prednisone, premarin [estrogens conjugated], and trovan [alatrofloxacin].  MEDICATIONS:  Current Outpatient Medications  Medication Sig Dispense Refill   acetaminophen (TYLENOL) 325 MG tablet Take 325 mg by mouth every 6 (six) hours as needed for headache.     allopurinol (ZYLOPRIM) 100 MG tablet Take 1 tablet (100 mg total) by mouth daily. FOR GOUT PREVENTION, 90 tablet 3   blood glucose meter kit and supplies KIT Dispense based on patient and insurance preference. Use to check blood sugar once daily. Dx: E11.22, N18.30 1 each 12   Cholecalciferol (VITAMIN D3) 5000 units CAPS Take 5,000 Units by mouth daily.     cyanocobalamin 1000 MCG tablet Take 1,000 mcg by mouth daily.     ezetimibe (ZETIA) 10 MG tablet TAKE 1 TABLET BY MOUTH DAILY FOR CHOLESTEROL 90 tablet 3   gabapentin (NEURONTIN) 300 MG capsule Take 1 capsule 4 x  /day for Pain 360 capsule 3   glimepiride (AMARYL) 1 MG tablet Take 1 tab by mouth in the morning if fasting is running 150+. Please do not take this medication if you are ill or not eating as this can cause a low blood sugar. (Patient taking differently: Take 1 mg by mouth See admin instructions. Take 1 tab by mouth daily as needed if fasting is running 150+. Please do not take this medication if you are ill or not eating as this can cause a low blood sugar.) 90 tablet 1   glucose blood test strip Use to test blood sugar once daily. Dx: E11.22, N18.30 100 each 12   hydrochlorothiazide (HYDRODIURIL) 25 MG tablet TAKE ONE TABLET BY MOUTH DAILY FOR FOR BLOOD PRESSURE AND FLUID RETENTION/ ANKLE SWELLING 90 tablet 1   Lancets MISC Use to test blood sugar once daily. Dx: E11.22, N18.30 200 each 11   lidocaine-prilocaine (EMLA) cream Apply 1 application. topically daily as needed (for port access). 30 g 3   loratadine (CLARITIN) 10 MG tablet Take 10 mg by mouth daily as needed for allergies.      metoprolol succinate (TOPROL-XL) 25 MG 24 hr tablet Take  1 tablet  Daily  for BP       /TAKE 1 TABLET BY MOUTH DAILY FOR BLOOD PRESSURE (Patient taking differently: Take 25 mg by mouth daily.) 90 tablet 3   ondansetron (ZOFRAN) 8 MG tablet Take 1 tablet (8 mg total) by mouth 2 (two) times daily as needed for refractory nausea / vomiting. Start on day 3 after carboplatin chemo. 30 tablet 1   PFIZER-BIONT COVID-19 VAC-TRIS SUSP injection      polyethylene glycol (MIRALAX) 17 g packet Take 17 g by mouth 2 (two) times daily. 17 grams in 6 oz of favorite drink twice a day until bowel movement.  LAXITIVE.  Restart if two days since last bowel movement 14 packet 0   rosuvastatin (CRESTOR) 5 MG tablet TAKE 1 TABLET BY MOUTH IN THE EVENING THREE TIMES WEEKLY FOR CHOLESTEROL 36 tablet 3   trolamine salicylate (ASPERCREME) 10 % cream Apply 1 application topically as needed for muscle pain (Use if you still feel tingles in feet in the morning after taking Gabapentin).     vitamin C (ASCORBIC ACID) 500 MG tablet Take 500 mg by mouth every evening.      No current facility-administered medications for this visit.    SUMMARY OF ONCOLOGIC HISTORY: Oncology History Overview Note  HRD positive   History of left breast cancer  02/26/2016 Initial Diagnosis   Left breast biopsy 11:00 position: invasive ductal carcinoma with DCIS, ER 90%, PR 10%, HER-2 negative, Ki-67 30%, grade 2, 2.2 cm palpable lesion T2 N0 stage II a clinical stage   03/18/2016 Surgery   Left lumpectomy: Invasive ductal carcinoma, grade 2, 6.3 cm, with high-grade DCIS, margins negative, 0/4 lymph nodes negative, ER 90%, via 10%, HER-2 negative ratio 0.97, Ki-67 30%, T3 N0 stage IIB   03/25/2016 Procedure   Genetic testing is negative for pathogenic mutations within any of the 20 Genes on the breast/ovarian cancer panel   04/04/2016 Oncotype testing   Oncotype DX recurrence score 37, 25% 10 year distant risk of recurrence   04/17/2016 -  07/31/2016 Chemotherapy   Adjuvant chemotherapy with dose dense Adriamycin and Cytoxan followed by Abraxane weekly 8 ( discontinued due to neuropathy)   09/01/2016 - 09/26/2016 Radiation Therapy   Adj XRT 1) Left breast: 42.5 Gy in 17 fractions. 2) Left breast boost: 7.5 Gy in 3 fractions.   12/02/2016 -  Anti-estrogen oral therapy   Anastrozole 1 mg daily   09/05/2021 -  Chemotherapy   Patient is on Treatment Plan : OVARIAN RECURRENT 3RD LINE Carboplatin D1 / Gemcitabine D1,8 (4/800) q21d     Ovarian cancer ([place])  04/03/2020 Imaging   US pelvis Complex cystic and solid mass in LEFT adnexa 12.5 cm diameter question cystic ovarian neoplasm; recommend correlation with serum tumor markers and further evaluation by MR imaging with and without contrast.     04/04/2020 Imaging   US venous Doppler No evidence of deep venous thrombosis in the right lower extremity. Left common femoral vein also patent.   04/15/2020 Imaging   MRI pelvis 1. Large complex solid and cystic mass arising from the right adnexa measuring 10.8 by 11.5 by 11.1 cm. This has an aggressive appearance with extensive enhancing mural soft tissue components. Findings are highly suspicious for malignant ovarian neoplasm. 2. Extensive bilateral retroperitoneal and bilateral iliac adenopathy compatible with metastatic disease. 3. Signs of extensive peritoneal carcinomatosis including ascites, enhancement, thickening and nodularity of the peritoneal reflections, omental caking and bulky peritoneal nodularity. 4. Suspected serosal involvement of the dome of bladder with loss of normal fat plane. Cannot rule out mural invasion by tumor.   04/17/2020 Tumor Marker   Patient's tumor was tested for the following markers: CA-125 Results of the tumor marker test revealed 1762.   04/20/2020 Cancer Staging   Staging form: Ovary, Fallopian Tube, and Primary Peritoneal Carcinoma, AJCC 8th Edition - Clinical: FIGO Stage IIIC (cT3, cN1, cM0) - Signed  by Heath Lark, MD on 04/20/2020   04/23/2020 Imaging   1. Redemonstrated dominant mixed solid and cystic mass arising from the vicinity of the right ovary measuring at least 11.5 x 10.7 cm, not significantly changed compared to prior MR, and consistent with primary ovarian malignancy.  2. Numerous bulky retroperitoneal, bilateral iliac, and pelvic sidewall lymph nodes. 3. Moderate volume ascites throughout the abdomen and pelvis with subtle thickening and nodularity throughout the peritoneum, and extensive bulky nodular metastatic disease of the omentum. 4. Constellation of findings is consistent with advanced nodal and peritoneal metastatic disease. 5. There are prominent subcentimeter epicardial lymph nodes, nonspecific although suspicious for nodal metastatic disease. No definite nodal metastatic disease in the chest. Attention on follow-up. 6. There are multiple small subpleural nodules at the right lung base overlying the diaphragm, measuring up to 7 mm. These are generally nonspecific  and less favored to represent pulmonary metastatic disease given distribution. Attention on follow-up. 7. Somewhat coarse contour of the liver, suggestive of cirrhosis, although out without overt morphologic stigmata. 8. Aortic Atherosclerosis (ICD10-I70.0).     04/24/2020 Procedure   Successful placement of a right internal jugular approach power injectable Port-A-Cath. The catheter is ready for immediate use.     05/02/2020 Tumor Marker   Patient's tumor was tested for the following markers: CA-125 Results of the tumor marker test revealed 1921   05/03/2020 - 12/12/2020 Chemotherapy   The patient had carboplatin and taxol for chemotherapy treatment.     05/08/2020 Procedure   Successful ultrasound-guided paracentesis yielding 2.6 liters of peritoneal fluid.   05/16/2020 Procedure   Successful ultrasound-guided paracentesis yielding 3.2 liters of peritoneal fluid   05/25/2020 Procedure   Successful  ultrasound-guided paracentesis yielding 3 liters of peritoneal fluid.     06/01/2020 Procedure   Successful ultrasound-guided therapeutic paracentesis yielding 1.7 liters of peritoneal fluid.   06/14/2020 Tumor Marker   Patient's tumor was tested for the following markers: CA-125 Results of the tumor marker test revealed 1309   06/20/2020 Imaging   1. Dominant mixed cystic and solid mass in the central pelvis is stable in the interval. 2. Clear interval decrease in retroperitoneal and pelvic sidewall lymphadenopathy. The bulky omental disease has also clearly decreased in the interval. 3. Interval decrease in ascites. 4. Stable 8 mm subpleural nodule along the diaphragm. 5. Aortic Atherosclerosis (ICD10-I70.0).   07/10/2020 Tumor Marker   Patient's tumor was tested for the following markers: CA-125 Results of the tumor marker test revealed 220   08/03/2020 Tumor Marker   Patient's tumor was tested for the following markers: CA-125 Results of the tumor marker test revealed 53.3.   09/03/2020 Tumor Marker   Patient's tumor was tested for the following markers: CA-125 Results of the tumor marker test revealed 29.7   09/20/2020 Imaging   1. Substantial improvement. The right ovarian cystic and solid mass is half the volume that it measured on 06/20/2020. Similar reduction in the bulk of the omental caking of tumor. Prior ascites is resolved and the retroperitoneal adenopathy is markedly improved. 2. Other imaging findings of potential clinical significance: Stable pleural-based nodularity along the right hemidiaphragm measuring about 1.1 by 0.7 by 0.3 cm. Stable hypodense lesion of the right kidney upper pole, probably a cyst although the configuration of this lesion makes it difficult to obtain accurate density measurements. Left ovarian cyst, stable. Chondrocalcinosis involving the acetabular labra. Mild lumbar spondylosis and degenerative disc disease. 3. Aortic atherosclerosis.      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 (stage ypT3c), predominantly viable with focal fibrosis and hemosiderin laden macrophages (possible mild treatment effect)  B: Ovary and fallopian tube, right, salpingo-oophorectomy - High grade serous carcinoma involving right ovary and fallopian tube with surface involvement, size up to 8 cm  - Areas of necrosis and hemosiderin laden macrophages suggestive of treatment effect - 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 - See synoptic report and comment  C: Ovary and fallopian tube, left, salpingo-oophorectomy - High grade serous carcinoma involving the left fallopian tube, size up to 0.5 cm - Ovary with no definite involvement by carcinoma identified - Nodular area of endometriosis and peritoneal inclusion cyst also present - See  synoptic report and comment   Procedure:    Bilateral salpingo-oophorectomy    Procedure:    Omentectomy    Specimen Integrity of Right Ovary:    Intact with surface involvement by tumor    Specimen Integrity of Left Ovary:    Capsule intact   TUMOR Tumor Site:    Right fallopian tube: favor as primary site; also involves right ovary and left fallopian tube  Histologic Type:    Serous carcinoma  Histologic Grade:    High grade  Tumor Size:    Greatest Dimension (Centimeters): in fallopian tube: 1.0 cm; in ovary: 8.0 cm Ovarian Surface Involvement:    Present    Laterality:    Right  Fallopian Tube Surface Involvement:    Present    Laterality:    Bilateral  Other Tissue / Organ Involvement:    Right ovary  Other Tissue / Organ Involvement:    Right fallopian tube  Other Tissue / Organ Involvement:    Left fallopian tube  Other Tissue / Organ  Involvement:    Omentum  Largest Extrapelvic Peritoneal Focus:    Macroscopic (greater than 2 cm)  Peritoneal / Ascitic Fluid:    Not submitted / unknown  Pleural Fluid:    Not submitted / unknown  Treatment Effect:    No definite or minimal response identified (chemotherapy response score [CRS] 1)   LYMPH NODES Regional Lymph Nodes:    No lymph nodes submitted or found   PATHOLOGIC STAGE CLASSIFICATION (pTNM, AJCC 8th Edition) TNM Descriptors:    y (post-treatment)  Primary Tumor (pT):    pT3c  Regional Lymph Nodes (pN):    pNX   FIGO STAGE FIGO Stage:    IIIC   ADDITIONAL FINDINGS Additional Findings:    Serous tubal intraepithelial carcinoma (STIC)  Additional Findings:    Left ovary with no definite carcinoma identified, left-sided nodule of endometriosis and peritoneal inclusion cyst    10/18/2020 - 10/20/2020 Hospital Admission   She was admitted to [facility] for interval debulking surgery   11/19/2020 Tumor Marker   Patient's tumor was tested for the following markers: CA-125. Results of the tumor marker test revealed 38.3   12/12/2020 Tumor Marker   Patient's tumor was tested for the following markers CA-125. Results of the tumor marker test revealed 42.3.   01/02/2021 Tumor Marker   Patient's tumor was tested for the following markers: CA-125. Results of the tumor marker test revealed 37.3   01/29/2021 Tumor Marker   Patient's tumor was tested for the following markers: CA-125 Results of the tumor marker test revealed 44.3   01/29/2021 Imaging   1. Interval increase in loculated appearing ascites in the low central abdomen and pelvis. 2. There is extensive peritoneal nodularity surrounding this fluid, the nodularity itself not appreciably changed in appearance compared to prior examination. 3. Additional peritoneal nodularity and/or mesenteric lymph nodes are unchanged. 4. Interval decrease in size of a low-attenuation nodule overlying the right hemidiaphragm, now  measuring 1.2 cm, previously 1.8 cm when measured similarly. Findings are consistent with treatment response of a metastatic nodule. No other evidence of intrathoracic metastatic disease. 5. Status post hysterectomy, oophorectomy, and omentectomy.   01/31/2021 - 08/26/2021 Chemotherapy   She is started on Olaparib       03/04/2021 Tumor Marker   Patient's tumor was tested for the following markers: CA-125 Results of the tumor marker test revealed 39.7   04/01/2021 Tumor Marker   Patient's tumor was tested for the  following markers: CA-125 Results of the tumor marker test revealed 32.3   05/10/2021 Imaging   1. No significant change in peritoneal carcinomatosis since previous study of 3 months ago, primarily in the pelvis where there is complex fluid and peritoneal nodularity. 2. No evidence of solid visceral organ metastasis. No evidence of bowel or ureteral obstruction. 3.  Aortic Atherosclerosis (ICD10-I70.0).   06/13/2021 Tumor Marker   Patient's tumor was tested for the following markers: CA-125. Results of the tumor marker test revealed 48.3.   07/29/2021 Tumor Marker   Patient's tumor was tested for the following markers: CA-125. Results of the tumor marker test revealed 54.   08/25/2021 Imaging   1. There is extensive peritoneal soft tissue thickening and nodularity, particularly about the low right hemipelvis, which is similar to prior examination. Some small peritoneal nodules throughout the abdomen and pelvis are slightly enlarged, other nodules are unchanged. 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 2. No significant change in a subpleural nodule overlying the right hemidiaphragm. 3. Unchanged, loculated small volume pelvic ascites. 4. Status post hysterectomy, oophorectomy, and omentectomy. 5. Coronary artery disease.   Aortic Atherosclerosis (ICD10-I70.0).     08/26/2021 Tumor Marker   Patient's tumor was tested for the  following markers: CA-125. Results of the tumor marker test revealed 56.   09/04/2021 Tumor Marker   Patient's tumor was tested for the following markers: CA-125. Results of the tumor marker test revealed 55.4.   09/05/2021 -  Chemotherapy   Patient is on Treatment Plan : OVARIAN RECURRENT 3RD LINE Carboplatin D1 / Gemcitabine D1,8 (4/800) q21d     10/07/2021 Tumor Marker   Patient's tumor was tested for the following markers: CA-125. Results of the tumor marker test revealed 51.9.   11/05/2021 Tumor Marker   Patient's tumor was tested for the following markers: CA-125. Results of the tumor marker test revealed 45.6.   11/27/2021 Imaging   1. Overall stable to slightly improved ill-defined soft tissue density throughout the pelvis. 2. Slight interval decrease in size of the omental lesions. 3. Stable free pelvic fluid. 4. New surgical changes from a right hip replacement. 5. New nodular and somewhat triangular density at the left lung base is likely an area of atelectasis. 6. Aortic atherosclerosis.   12/24/2021 Tumor Marker   Patient's tumor was tested for the following markers: CA-125. Results of the tumor marker test revealed 39.6.   02/04/2022 Tumor Marker   Patient's tumor was tested for the following markers: CA-125. Results of the tumor marker test revealed 36.3.   03/05/2022 Imaging   1. Similar to mild interval increase in size of right pericolic goiter nodules and omental nodules. 2. Similar appearance of the amorphous soft tissue within the pelvis. There is decreased fluid component   03/06/2022 Tumor Marker   Patient's tumor was tested for the following markers: CA-125. Results of the tumor marker test revealed 56.2.   04/07/2022 Tumor Marker   Patient's tumor was tested for the following markers: CA-125. Results of the tumor marker test revealed 65.5.     PHYSICAL EXAMINATION: ECOG PERFORMANCE STATUS: 2 - Symptomatic, <50% confined to bed  Vitals:   05/12/22 0933   BP: 128/66  Pulse: 84  Resp: 18  Temp: 97.9 F (36.6 C)  SpO2: 98%   Filed Weights   05/12/22 0933  Weight: 143 lb 6.4 oz (65 kg)    GENERAL:alert, no distress and comfortable NEURO: alert & oriented x 3 with fluent speech, no  focal motor/sensory deficits  LABORATORY DATA:  I have reviewed the data as listed    Component Value Date/Time   NA 142 05/12/2022 0908   NA 141 11/06/2016 1102   K 3.2 (L) 05/12/2022 0908   K 3.9 11/06/2016 1102   CL 107 05/12/2022 0908   CO2 30 05/12/2022 0908   CO2 27 11/06/2016 1102   GLUCOSE 192 (H) 05/12/2022 0908   GLUCOSE 130 11/06/2016 1102   BUN 24 (H) 05/12/2022 0908   BUN 18.0 11/06/2016 1102   CREATININE 1.19 (H) 05/12/2022 0908   CREATININE 1.42 (H) 02/21/2021 1414   CREATININE 1.3 (H) 11/06/2016 1102   CALCIUM 9.9 05/12/2022 0908   CALCIUM 10.2 11/06/2016 1102   PROT 6.5 05/12/2022 0908   PROT 7.1 11/06/2016 1102   ALBUMIN 3.5 05/12/2022 0908   ALBUMIN 3.7 11/06/2016 1102   AST 35 05/12/2022 0908   AST 52 (H) 11/06/2016 1102   ALT 15 05/12/2022 0908   ALT 58 (H) 11/06/2016 1102   ALKPHOS 103 05/12/2022 0908   ALKPHOS 159 (H) 11/06/2016 1102   BILITOT 0.6 05/12/2022 0908   BILITOT 0.53 11/06/2016 1102   GFRNONAA 45 (L) 05/12/2022 0908   GFRNONAA 34 (L) 02/21/2021 1414   GFRAA 40 (L) 02/21/2021 1414    No results found for: "SPEP", "UPEP"  Lab Results  Component Value Date   WBC 4.1 05/12/2022   NEUTROABS 2.8 05/12/2022   HGB 9.6 (L) 05/12/2022   HCT 28.8 (L) 05/12/2022   MCV 98.6 05/12/2022   PLT 84 (L) 05/12/2022      Chemistry      Component Value Date/Time   NA 142 05/12/2022 0908   NA 141 11/06/2016 1102   K 3.2 (L) 05/12/2022 0908   K 3.9 11/06/2016 1102   CL 107 05/12/2022 0908   CO2 30 05/12/2022 0908   CO2 27 11/06/2016 1102   BUN 24 (H) 05/12/2022 0908   BUN 18.0 11/06/2016 1102   CREATININE 1.19 (H) 05/12/2022 0908   CREATININE 1.42 (H) 02/21/2021 1414   CREATININE 1.3 (H) 11/06/2016 1102       Component Value Date/Time   CALCIUM 9.9 05/12/2022 0908   CALCIUM 10.2 11/06/2016 1102   ALKPHOS 103 05/12/2022 0908   ALKPHOS 159 (H) 11/06/2016 1102   AST 35 05/12/2022 0908   AST 52 (H) 11/06/2016 1102   ALT 15 05/12/2022 0908   ALT 58 (H) 11/06/2016 1102   BILITOT 0.6 05/12/2022 0908   BILITOT 0.53 11/06/2016 1102

## 2022-05-12 NOTE — Patient Instructions (Addendum)
Hypomagnesemia Hypomagnesemia is a condition in which the level of magnesium in the blood is too low. Magnesium is a mineral that is found in many foods. It is used in many different processes in the body. Hypomagnesemia can affect every organ in the body. In severe cases, it can cause life-threatening problems. What are the causes? This condition may be caused by: Not getting enough magnesium in your diet or not having enough healthy foods to eat (malnutrition). Problems with magnesium absorption in the intestines. Dehydration. Excessive use of alcohol. Vomiting. Severe or long-term (chronic) diarrhea. Some medicines, including medicines that make you urinate more often (diuretics). Certain diseases, such as kidney disease, diabetes, celiac disease, and overactive thyroid. What are the signs or symptoms? Symptoms of this condition include: Loss of appetite, nausea, and vomiting. Involuntary shaking or trembling of a body part (tremor). Muscle weakness or tingling in the arms and legs. Sudden tightening of muscles (muscle spasms). Confusion. Psychiatric issues, such as: Depression and irritability. Psychosis. A feeling of fluttering of the heart (palpitations). Seizures. These symptoms are more severe if magnesium levels drop suddenly. How is this diagnosed? This condition may be diagnosed based on: Your symptoms and medical history. A physical exam. Blood and urine tests. How is this treated? Treatment depends on the cause and the severity of the condition. It may be treated by: Taking a magnesium supplement. This can be taken in pill form. If the condition is severe, magnesium is usually given through an IV. Making changes to your diet. You may be directed to eat foods that have a lot of magnesium, such as green leafy vegetables, peas, beans, and nuts. Not drinking alcohol. If you are struggling not to drink, ask your health care provider for help. Follow these instructions at  home: Eating and drinking     Make sure that your diet includes foods with magnesium. Foods that have a lot of magnesium in them include: Green leafy vegetables, such as spinach and broccoli. Beans and peas. Nuts and seeds, such as almonds and sunflower seeds. Whole grains, such as whole grain bread and fortified cereals. Drink fluids that contain salts and minerals (electrolytes), such as sports drinks, when you are active. Do not drink alcohol. General instructions Take over-the-counter and prescription medicines only as told by your health care provider. Take magnesium supplements as directed if your health care provider tells you to take them. Have your magnesium levels monitored as told by your health care provider. Keep all follow-up visits. This is important. Contact a health care provider if: You get worse instead of better. Your symptoms return. Get help right away if: You develop severe muscle weakness. You have trouble breathing. You feel that your heart is racing. These symptoms may represent a serious problem that is an emergency. Do not wait to see if the symptoms will go away. Get medical help right away. Call your local emergency services (911 in the [place] Do not drive yourself to the hospital. Summary Hypomagnesemia is a condition in which the level of magnesium in the blood is too low. Hypomagnesemia can affect every organ in the body. Treatment may include eating more foods that contain magnesium, taking magnesium supplements, and not drinking alcohol. Have your magnesium levels monitored as told by your health care provider. This information is not intended to replace advice given to you by your health care provider. Make sure you discuss any questions you have with your health care provider. Document Revised: 04/16/2021 Document Reviewed: 04/16/2021 Elsevier Patient Education  [place].  Magnesium Sulfate Injection What is this medication? MAGNESIUM  SULFATE (mag NEE zee um SUL fate) prevents and treats low levels of magnesium in your body. It may also be used to prevent and treat seizures during pregnancy in people with high blood pressure disorders, such as preeclampsia or eclampsia. Magnesium plays an important role in maintaining the health of your muscles and nervous system. This medicine may be used for other purposes; ask your health care provider or pharmacist if you have questions. What should I tell my care team before I take this medication? They need to know if you have any of these conditions: Heart disease History of irregular heart beat Kidney disease An unusual or allergic reaction to magnesium sulfate, medications, foods, dyes, or preservatives Pregnant or trying to get pregnant Breast-feeding How should I use this medication? This medication is for infusion into a vein. It is given in a hospital or clinic setting. Talk to your care team about the use of this medication in children. While this medication may be prescribed for selected conditions, precautions do apply. Overdosage: If you think you have taken too much of this medicine contact a poison control center or emergency room at once. NOTE: This medicine is only for you. Do not share this medicine with others. What if I miss a dose? This does not apply. What may interact with this medication? Certain medications for anxiety or sleep Certain medications for seizures like phenobarbital Digoxin Medications that relax muscles for surgery Narcotic medications for pain This list may not describe all possible interactions. Give your health care provider a list of all the medicines, herbs, non-prescription drugs, or dietary supplements you use. Also tell them if you smoke, drink alcohol, or use illegal drugs. Some items may interact with your medicine. What should I watch for while using this medication? Your condition will be monitored carefully while you are receiving  this medication. You may need blood work done while you are receiving this medication. What side effects may I notice from receiving this medication? Side effects that you should report to your care team as soon as possible: Allergic reactions--skin rash, itching, hives, swelling of the face, lips, tongue, or throat High magnesium level--confusion, drowsiness, facial flushing, redness, sweating, muscle weakness, fast or irregular heartbeat, trouble breathing Low blood pressure--dizziness, feeling faint or lightheaded, blurry vision Side effects that usually do not require medical attention (report to your care team if they continue or are bothersome): Headache Nausea This list may not describe all possible side effects. Call your doctor for medical advice about side effects. You may report side effects to FDA at 1-800-FDA-1088. Where should I keep my medication? This medication is given in a hospital or clinic and will not be stored at home. NOTE: This sheet is a summary. It may not cover all possible information. If you have questions about this medicine, talk to your doctor, pharmacist, or health care provider.  2023 Elsevier/Gold Standard (2021-01-31 00:00:00)

## 2022-05-12 NOTE — Assessment & Plan Note (Signed)
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

## 2022-05-12 NOTE — Assessment & Plan Note (Signed)
The cause of this is multifactorial, likely exacerbated by recent oral magnesium supplement I recommend discontinuation of oral magnesium supplement and to take Imodium as needed

## 2022-05-13 LAB — CA 125: Cancer Antigen (CA) 125: 66.6 U/mL — ABNORMAL HIGH (ref 0.0–38.1)

## 2022-05-20 ENCOUNTER — Other Ambulatory Visit: Payer: Self-pay | Admitting: Adult Health

## 2022-05-20 MED FILL — Dexamethasone Sodium Phosphate Inj 100 MG/10ML: INTRAMUSCULAR | Qty: 1 | Status: AC

## 2022-05-20 MED FILL — Fosaprepitant Dimeglumine For IV Infusion 150 MG (Base Eq): INTRAVENOUS | Qty: 5 | Status: AC

## 2022-05-21 ENCOUNTER — Inpatient Hospital Stay: Payer: PPO

## 2022-05-21 ENCOUNTER — Other Ambulatory Visit: Payer: Self-pay

## 2022-05-21 VITALS — BP 116/62 | HR 73 | Temp 98.1°F | Resp 18

## 2022-05-21 DIAGNOSIS — Z5111 Encounter for antineoplastic chemotherapy: Secondary | ICD-10-CM | POA: Diagnosis not present

## 2022-05-21 DIAGNOSIS — C569 Malignant neoplasm of unspecified ovary: Secondary | ICD-10-CM

## 2022-05-21 DIAGNOSIS — C50212 Malignant neoplasm of upper-inner quadrant of left female breast: Secondary | ICD-10-CM

## 2022-05-21 DIAGNOSIS — Z95828 Presence of other vascular implants and grafts: Secondary | ICD-10-CM

## 2022-05-21 DIAGNOSIS — Z853 Personal history of malignant neoplasm of breast: Secondary | ICD-10-CM

## 2022-05-21 LAB — CMP (CANCER CENTER ONLY)
ALT: 15 U/L (ref 0–44)
AST: 36 U/L (ref 15–41)
Albumin: 3.5 g/dL (ref 3.5–5.0)
Alkaline Phosphatase: 108 U/L (ref 38–126)
Anion gap: 5 (ref 5–15)
BUN: 24 mg/dL — ABNORMAL HIGH (ref 8–23)
CO2: 30 mmol/L (ref 22–32)
Calcium: 9.8 mg/dL (ref 8.9–10.3)
Chloride: 106 mmol/L (ref 98–111)
Creatinine: 1.25 mg/dL — ABNORMAL HIGH (ref 0.44–1.00)
GFR, Estimated: 43 mL/min — ABNORMAL LOW (ref >60)
Glucose, Bld: 165 mg/dL — ABNORMAL HIGH (ref 70–99)
Potassium: 3.4 mmol/L — ABNORMAL LOW (ref 3.5–5.1)
Sodium: 141 mmol/L (ref 135–145)
Total Bilirubin: 0.6 mg/dL (ref 0.3–1.2)
Total Protein: 6.9 g/dL (ref 6.5–8.1)

## 2022-05-21 LAB — CBC WITH DIFFERENTIAL (CANCER CENTER ONLY)
Abs Immature Granulocytes: 0.01 10*3/uL (ref 0.00–0.07)
Basophils Absolute: 0 10*3/uL (ref 0.0–0.1)
Basophils Relative: 0 %
Eosinophils Absolute: 0.1 10*3/uL (ref 0.0–0.5)
Eosinophils Relative: 2 %
HCT: 29.6 % — ABNORMAL LOW (ref 36.0–46.0)
Hemoglobin: 9.9 g/dL — ABNORMAL LOW (ref 12.0–15.0)
Immature Granulocytes: 0 %
Lymphocytes Relative: 23 %
Lymphs Abs: 1.1 10*3/uL (ref 0.7–4.0)
MCH: 33.1 pg (ref 26.0–34.0)
MCHC: 33.4 g/dL (ref 30.0–36.0)
MCV: 99 fL (ref 80.0–100.0)
Monocytes Absolute: 0.5 10*3/uL (ref 0.1–1.0)
Monocytes Relative: 11 %
Neutro Abs: 2.9 10*3/uL (ref 1.7–7.7)
Neutrophils Relative %: 64 %
Platelet Count: 90 10*3/uL — ABNORMAL LOW (ref 150–400)
RBC: 2.99 MIL/uL — ABNORMAL LOW (ref 3.87–5.11)
RDW: 17.1 % — ABNORMAL HIGH (ref 11.5–15.5)
WBC Count: 4.5 10*3/uL (ref 4.0–10.5)
nRBC: 0 % (ref 0.0–0.2)

## 2022-05-21 MED ORDER — SODIUM CHLORIDE 0.9 % IV SOLN: Freq: Once | INTRAVENOUS | Status: AC

## 2022-05-21 MED ORDER — FAMOTIDINE IN NACL 20-0.9 MG/50ML-% IV SOLN
20.0000 mg | Freq: Once | INTRAVENOUS | Status: AC
  Administered 2022-05-21: 20 mg via INTRAVENOUS
  Filled 2022-05-21: qty 50

## 2022-05-21 MED ORDER — SODIUM CHLORIDE 0.9 % IV SOLN
150.0000 mg | Freq: Once | INTRAVENOUS | Status: AC
  Administered 2022-05-21: 150 mg via INTRAVENOUS
  Filled 2022-05-21: qty 150

## 2022-05-21 MED ORDER — HEPARIN SOD (PORK) LOCK FLUSH 100 UNIT/ML IV SOLN
500.0000 [IU] | Freq: Once | INTRAVENOUS | Status: AC | PRN
  Administered 2022-05-21: 500 [IU]

## 2022-05-21 MED ORDER — SODIUM CHLORIDE 0.9% FLUSH
10.0000 mL | INTRAVENOUS | Status: DC | PRN
  Administered 2022-05-21: 10 mL

## 2022-05-21 MED ORDER — PALONOSETRON HCL INJECTION 0.25 MG/5ML
0.2500 mg | Freq: Once | INTRAVENOUS | Status: AC
  Administered 2022-05-21: 0.25 mg via INTRAVENOUS
  Filled 2022-05-21: qty 5

## 2022-05-21 MED ORDER — DIPHENHYDRAMINE HCL 50 MG/ML IJ SOLN
25.0000 mg | Freq: Once | INTRAMUSCULAR | Status: AC
  Administered 2022-05-21: 25 mg via INTRAVENOUS
  Filled 2022-05-21: qty 1

## 2022-05-21 MED ORDER — SODIUM CHLORIDE 0.9 % IV SOLN
10.0000 mg | Freq: Once | INTRAVENOUS | Status: AC
  Administered 2022-05-21: 10 mg via INTRAVENOUS
  Filled 2022-05-21: qty 10

## 2022-05-21 MED ORDER — SODIUM CHLORIDE 0.9 % IV SOLN
500.0000 mg/m2 | Freq: Once | INTRAVENOUS | Status: AC
  Administered 2022-05-21: 874 mg via INTRAVENOUS
  Filled 2022-05-21: qty 22.99

## 2022-05-21 MED ORDER — SODIUM CHLORIDE 0.9% FLUSH
10.0000 mL | Freq: Once | INTRAVENOUS | Status: AC
  Administered 2022-05-21: 10 mL

## 2022-05-21 MED ORDER — SODIUM CHLORIDE 0.9 % IV SOLN
237.6000 mg | Freq: Once | INTRAVENOUS | Status: AC
  Administered 2022-05-21: 240 mg via INTRAVENOUS
  Filled 2022-05-21: qty 24

## 2022-05-21 NOTE — Patient Instructions (Signed)
[facility]  Discharge Instructions: Thank you for choosing [facility] to provide your oncology and hematology care.   If you have a lab appointment with the [facility], please go directly to the [facility] and check in at the registration area.   Wear comfortable clothing and clothing appropriate for easy access to any Portacath or PICC line.   We strive to give you quality time with your provider. You may need to reschedule your appointment if you arrive late (15 or more minutes).  Arriving late affects you and other patients whose appointments are after yours.  Also, if you miss three or more appointments without notifying the office, you may be dismissed from the clinic at the provider's discretion.      For prescription refill requests, have your pharmacy contact our office and allow 72 hours for refills to be completed.    Today you received the following chemotherapy and/or immunotherapy agents: Gemzar, Carboplatin      To help prevent nausea and vomiting after your treatment, we encourage you to take your nausea medication as directed.  BELOW ARE SYMPTOMS THAT SHOULD BE REPORTED IMMEDIATELY: *FEVER GREATER THAN 100.4 F (38 C) OR HIGHER *CHILLS OR SWEATING *NAUSEA AND VOMITING THAT IS NOT CONTROLLED WITH YOUR NAUSEA MEDICATION *UNUSUAL SHORTNESS OF BREATH *UNUSUAL BRUISING OR BLEEDING *URINARY PROBLEMS (pain or burning when urinating, or frequent urination) *BOWEL PROBLEMS (unusual diarrhea, constipation, pain near the anus) TENDERNESS IN MOUTH AND THROAT WITH OR WITHOUT PRESENCE OF ULCERS (sore throat, sores in mouth, or a toothache) UNUSUAL RASH, SWELLING OR PAIN  UNUSUAL VAGINAL DISCHARGE OR ITCHING   Items with * indicate a potential emergency and should be followed up as soon as possible or go to the Emergency Department if any problems should occur.  Please show the CHEMOTHERAPY ALERT CARD or IMMUNOTHERAPY ALERT CARD at  check-in to the Emergency Department and triage nurse.  Should you have questions after your visit or need to cancel or reschedule your appointment, please contact [facility]  Dept: [phone]  and follow the prompts.  Office hours are 8:00 a.m. to 4:30 p.m. Monday - Friday. Please note that voicemails left after 4:00 p.m. may not be returned until the following business day.  We are closed weekends and major holidays. You have access to a nurse at all times for urgent questions. Please call the main number to the clinic Dept: [phone] and follow the prompts.   For any non-urgent questions, you may also contact your provider using MyChart. We now offer e-Visits for anyone 84 and older to request care online for non-urgent symptoms. For details visit [email or web address].   Also download the MyChart app! Go to the app store, search "MyChart", open the app, select [facility], and log in with your MyChart username and password.  Masks are optional in the cancer centers. If you would like for your care team to wear a mask while they are taking care of you, please let them know. For doctor visits, patients may have with them one support person who is at least 84 years old. At this time, visitors are not allowed in the infusion area.

## 2022-05-25 ENCOUNTER — Encounter: Payer: Self-pay | Admitting: Internal Medicine

## 2022-05-25 ENCOUNTER — Encounter: Payer: Self-pay | Admitting: Adult Health

## 2022-05-26 ENCOUNTER — Inpatient Hospital Stay: Payer: PPO | Attending: Gynecologic Oncology

## 2022-05-26 ENCOUNTER — Other Ambulatory Visit: Payer: Self-pay

## 2022-05-26 ENCOUNTER — Telehealth: Payer: Self-pay

## 2022-05-26 DIAGNOSIS — R3915 Urgency of urination: Secondary | ICD-10-CM | POA: Diagnosis not present

## 2022-05-26 LAB — URINALYSIS, COMPLETE (UACMP) WITH MICROSCOPIC
Bacteria, UA: NONE SEEN
Bilirubin Urine: NEGATIVE
Glucose, UA: NEGATIVE mg/dL
Hgb urine dipstick: NEGATIVE
Ketones, ur: NEGATIVE mg/dL
Leukocytes,Ua: NEGATIVE
Nitrite: NEGATIVE
Protein, ur: NEGATIVE mg/dL
Specific Gravity, Urine: 1.014 (ref 1.005–1.030)
pH: 6 (ref 5.0–8.0)

## 2022-05-27 ENCOUNTER — Telehealth: Payer: Self-pay

## 2022-05-27 LAB — URINE CULTURE: Culture: <10000 — AB

## 2022-05-27 NOTE — Telephone Encounter (Signed)
She called and left a message. She saw the UA results on mychart and she is feeling better after taking the laxatives. She is no longer constipated and feeling better.  FYI

## 2022-05-27 NOTE — Telephone Encounter (Signed)
Called and given below message. She verbalized understanding. 

## 2022-06-05 ENCOUNTER — Telehealth: Payer: Self-pay

## 2022-06-05 NOTE — Telephone Encounter (Signed)
Returned her call. She went to beach and just came home yesterday. A family member has tested positive for covid that she was around socially. She will test herself today and call the office back if she is covid positive. She is concerned about the upcoming appts.

## 2022-06-06 ENCOUNTER — Other Ambulatory Visit: Payer: Self-pay | Admitting: Hematology and Oncology

## 2022-06-06 ENCOUNTER — Encounter: Payer: Self-pay | Admitting: Adult Health

## 2022-06-06 NOTE — Progress Notes (Deleted)
ANNUAL WELLNESS VISIT AND FOLLOW UP  Assessment:   Annual Medicare Wellness Visit Due annually  Health maintenance reviewed  Atherosclerosis of aorta ([place]) - per CT 05/10/2021 Control blood pressure, cholesterol, glucose, increase exercise.   Hyperlipidemia associated with T2DM ([place]) Continue rosuvastatin; monitor closely on chemo; some LFT elevations LDL goal is <70 Continue low cholesterol diet and exercise.  Check lipid panel.   Diabetes with diabetic chronic kidney disease ([place]) Currently with slow weight loss and A1Cs in prediabetic range; off metformin, on PRN low dose glimepiride -take only if fasting 150+, needs rarely  Continue diet and exercise.  Perform daily foot/skin check, notify office of any concerning changes.  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 Hollie Salk, improved Off of metformin Increase fluids, avoid NSAIDS, monitor sugars, will monitor  Diabetic neuropathy, painful (HCC)/ chemotherapy induced neuropathy (HCC) Check feet daily, gabapentin 300 mg PRN at night.   Diabetic retinopathy, bil (HCC) Control sugars, ophth following  Malignant neoplasm of upper-inner quadrant of left breast in female, estrogen receptor positive ([place]) Being monitored by annual mammogram and oncology  Ovarian cancer (HCC)/ Peritoneal metastasis ([place]) Chemotherapy followed by Dr. [name], has port, S/p resection 10/2020 Has routine CBC/CMP by oncology  *** Reviewed lifestyle for immune support; increase fresh fruits and veggies, beans, whole grain; minimal processed/red meat/animal products. Increase brisk walking.   Pancytopenia (HCC)/anemia in CKD/anemia in neoplastic disease Hem/onc following closely;  Gout Continue allopurinol *** Diet discussed Check uric acid   GERD  Hx of; recently well controlled; continue meds Discussed diet, avoiding triggers and other lifestyle changes  Osteopenia - Repeat DEXA in 2 years - increase exercise, high  calcium diet, continue vitamin D suppement  Vit D def Continue to recommend supplementation for goal of 60-100 Check vitamin D level   No orders of the defined types were placed in this encounter.   Over 30 minutes of exam, counseling, chart review, and critical decision making was performed Future Appointments  Date Time Provider [place]  06/10/2022  9:30 AM Liane Comber, NP [facility] None  06/11/2022 10:30 AM [facility]-CT 2 [facility]  06/13/2022  9:40 AM Heath Lark, MD [facility] None  09/15/2022  9:30 AM [name], MD [facility] None  02/25/2023 10:00 AM Darrol Jump, NP [facility] None    Plan:   During the course of the visit the patient was educated and counseled about appropriate screening and preventive services including:   Pneumococcal vaccine  Influenza vaccine Td vaccine Prevnar 13 Screening electrocardiogram Screening mammography Bone densitometry screening Colorectal cancer screening Diabetes screening Glaucoma screening Nutrition counseling  Advanced directives: given info/requested copies    Subjective:   Kayla Baker is a 86 y.o. female who presents for AWV and follow up. She has Essential hypertension; Hyperlipidemia associated with type 2 diabetes mellitus ([place]); T2_NIDDM w/CKD3 ([place]); Vitamin D deficiency; Medication management; Diabetic neuropathy, painful ([place]); Gout; History of left breast cancer; Chemotherapy-induced peripheral neuropathy ([place]); Port catheter in place; CKD stage 3 due to type 2 diabetes mellitus ([place]); Edema of right lower leg; Ovarian cancer ([place]); Goals of care, counseling/discussion; Anemia in neoplastic disease; Pancytopenia, acquired ([place]); Cancer associated pain; Genetic testing; Deficiency anemia; Mild nonproliferative diabetic retinopathy of both eyes without macular edema associated with type 2 diabetes mellitus ([place]); Osteopenia; Aortic atherosclerosis ([place]) by Abd CT scan on 05/10/2021; Nodule of lower  lobe of right lung; Poor dentition; Adhesive capsulitis of right shoulder; Postoperative breast asymmetry; Hypomagnesemia; and Diarrhea on their problem  list.  She is married, no children, niece has new baby and enjoying seeing her. 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 She enjoys spending time with family.   She had fall in 10/2021, R femoral neck fracture and underwent hemiarthroplasty by Dr. Candee Furbish on 11/08/2021 ***  In 2021 she was unfortunately diagnosed with large R sided ovarian tumor with consequent RLE edema, urinary incontinence (wearing depends, improved), had port cath placed and started chemotherapy managed by Dr. Heath Lark, s/p resction by Dr. Everitt Amber at [facility] on 10/18/2020. Chemotherapy with some intolerance, pancytopenia/anemia that have required 2 transfusions and delayed between some treatments, also some elevated LFTs, however is responding with improved cancer markers and stable per CT 03/05/2022. Overall she reports she feels well and in good spirits.   Other significant current problems include hx/o Triple (+) Lt Breast Ca w/ Partial Lt Mastectomy  in April 2017 followed by Chemo tx 5-07/2016 then Radiation in Oct; she completed 4 years of anastrozole, was d/c'd due to ovarian cancer. Last mammogram 03/21/2022.   BMI is There is no height or weight on file to calculate BMI., she has been working on diet, pushing fruits/vegetables, watching white carbs; she does have [employer] membership but hasn't been going since the pandemic. She is very active with housework and yardwork.  Wt Readings from Last 3 Encounters:  05/12/22 143 lb 6.4 oz (65 kg)  04/07/22 147 lb 12.8 oz (67 kg)  03/07/22 146 lb 6.4 oz (66.4 kg)   Her blood pressure has been controlled at home, today their BP is   She does not workout. She denies chest pain, shortness of breath, dizziness.   She has aortic atherosclerosis per CT 05/2021.   She is on cholesterol  medication (on zetia, hx of LFT elevation with statin, on rosuvastatin 5 mg three days a week) and denies myalgias. Her cholesterol is at goal. The cholesterol last visit was:   Lab Results  Component Value Date   CHOL 117 02/21/2022   HDL 45 (L) 02/21/2022   LDLCALC 53 02/21/2022   TRIG 102 02/21/2022   CHOLHDL 2.6 02/21/2022   She has been working on diet and exercise for T2 diabetes (off of metformin due to CKD, glimepiride 1 mg PRN AM if fasting 150+), and denies foot ulcerations, hyperglycemia, hypoglycemia , increased appetite, nausea, polydipsia, polyuria, visual disturbances, vomiting and weight loss. She reports that her blood sugars have been running 91-121. She does have some mild neuropathy in her feet (also r/t chemotherapy), on gabapentin.    Last A1C in the office was:  Lab Results  Component Value Date   HGBA1C 6.5 (H) 02/21/2022   CKD III associated with T2DM; followed by Dr. Hollie Salk annually; last GFR:  Lab Results  Component Value Date   EGFR 38 (L) 11/06/2016   EGFR 30 (L) 08/07/2016   EGFR 32 (L) 07/31/2016   Lab Results  Component Value Date   MICRALBCREAT NOTE 02/21/2022   Patient is on Vitamin D supplement, taking 5000 IU daily. Lab Results  Component Value Date   VD25OH 72 02/21/2022     Patient is on allopurinol for gout and does not report a recent flare.  Lab Results  Component Value Date   LABURIC 4.4 02/21/2022   Hem/onc and nephrology also following chronic anemia closely; had normal iron/b12, anemia of neoplastic     Latest Ref Rng & Units 05/21/2022    9:06 AM 05/12/2022  9:08 AM 04/07/2022    9:06 AM  CBC  WBC 4.0 - 10.5 K/uL 4.5  4.1  2.8   Hemoglobin 12.0 - 15.0 g/dL 9.9  9.6  9.1   Hematocrit 36.0 - 46.0 % 29.6  28.8  28.3   Platelets 150 - 400 K/uL 90  84  85      Medication Review  Current Outpatient Medications (Endocrine & Metabolic):    glimepiride (AMARYL) 1 MG tablet, Take 1 tab by mouth in the morning if fasting is running  150+. Please do not take this medication if you are ill or not eating as this can cause a low blood sugar. (Patient taking differently: Take 1 mg by mouth See admin instructions. Take 1 tab by mouth daily as needed if fasting is running 150+. Please do not take this medication if you are ill or not eating as this can cause a low blood sugar.)  Current Outpatient Medications (Cardiovascular):    ezetimibe (ZETIA) 10 MG tablet, TAKE 1 TABLET BY MOUTH DAILY FOR CHOLESTEROL   hydrochlorothiazide (HYDRODIURIL) 25 MG tablet, Take 1 tablet Daily for BP & Fluid Retention                                        /                          TAKE                           BY                   MOUTH   metoprolol succinate (TOPROL-XL) 25 MG 24 hr tablet, Take  1 tablet  Daily  for BP       /TAKE 1 TABLET BY MOUTH DAILY FOR BLOOD PRESSURE (Patient taking differently: Take 25 mg by mouth daily.)   rosuvastatin (CRESTOR) 5 MG tablet, TAKE 1 TABLET BY MOUTH IN THE EVENING THREE TIMES WEEKLY FOR CHOLESTEROL  Current Outpatient Medications (Respiratory):    loratadine (CLARITIN) 10 MG tablet, Take 10 mg by mouth daily as needed for allergies.  Current Outpatient Medications (Analgesics):    acetaminophen (TYLENOL) 325 MG tablet, Take 325 mg by mouth every 6 (six) hours as needed for headache.   allopurinol (ZYLOPRIM) 100 MG tablet, Take 1 tablet (100 mg total) by mouth daily. FOR GOUT PREVENTION,  Current Outpatient Medications (Hematological):    cyanocobalamin 1000 MCG tablet, Take 1,000 mcg by mouth daily.  Current Outpatient Medications (Other):    blood glucose meter kit and supplies KIT, Dispense based on patient and insurance preference. Use to check blood sugar once daily. Dx: E11.22, N18.30   Cholecalciferol (VITAMIN D3) 5000 units CAPS, Take 5,000 Units by mouth daily.   gabapentin (NEURONTIN) 300 MG capsule, Take 1 capsule 4 x  /day for Pain   glucose blood test strip, Use to test blood sugar once daily.  Dx: E11.22, N18.30   Lancets MISC, Use to test blood sugar once daily. Dx: E11.22, N18.30   lidocaine-prilocaine (EMLA) cream, Apply 1 application. topically daily as needed (for port access).   ondansetron (ZOFRAN) 8 MG tablet, Take 1 tablet (8 mg total) by mouth 2 (two) times daily as needed for refractory nausea / vomiting. Start on day 3 after carboplatin chemo.  PFIZER-BIONT COVID-19 VAC-TRIS SUSP injection,    polyethylene glycol (MIRALAX) 17 g packet, Take 17 g by mouth 2 (two) times daily. 17 grams in 6 oz of favorite drink twice a day until bowel movement.  LAXITIVE.  Restart if two days since last bowel movement   trolamine salicylate (ASPERCREME) 10 % cream, Apply 1 application topically as needed for muscle pain (Use if you still feel tingles in feet in the morning after taking Gabapentin).   vitamin C (ASCORBIC ACID) 500 MG tablet, Take 500 mg by mouth every evening.   Current Problems (verified) Patient Active Problem List   Diagnosis Date Noted   Diarrhea 05/12/2022   Hypomagnesemia 02/22/2022   Poor dentition 07/26/2021   Nodule of lower lobe of right lung 06/27/2021   Aortic atherosclerosis ([place]) by Abd CT scan on 05/10/2021 06/10/2021   Mild nonproliferative diabetic retinopathy of both eyes without macular edema associated with type 2 diabetes mellitus ([place]) 02/20/2021   Osteopenia 02/20/2021   Deficiency anemia 11/02/2020   Genetic testing 09/14/2020   Pancytopenia, acquired ([place]) 05/15/2020   Cancer associated pain 05/15/2020   Anemia in neoplastic disease 05/03/2020   Goals of care, counseling/discussion 04/20/2020   Ovarian cancer ([place]) 04/19/2020   Edema of right lower leg 04/05/2020   CKD stage 3 due to type 2 diabetes mellitus ([place]) 07/05/2018   Adhesive capsulitis of right shoulder 04/27/2018   Postoperative breast asymmetry 04/01/2017   Port catheter in place 09/18/2016   Chemotherapy-induced peripheral neuropathy ([place]) 08/07/2016   History of left breast  cancer 02/29/2016   Gout 12/08/2014   Diabetic neuropathy, painful ([place]) 08/28/2014   Medication management 08/27/2014   Essential hypertension 11/10/2013   Hyperlipidemia associated with type 2 diabetes mellitus ([place]) 11/10/2013   T2_NIDDM w/CKD3 ([place]) 11/10/2013   Vitamin D deficiency 11/10/2013    Screening Tests Immunization History  Administered Date(s) Administered   DTaP 07/01/2004   Influenza Whole 08/15/2013   Influenza, High Dose Seasonal PF 08/28/2014, 09/20/2015, 08/19/2017, 09/01/2018, 07/28/2019   Influenza-Unspecified 08/04/2020, 08/15/2021   PFIZER(Purple Top)SARS-COV-2 Vaccination 12/12/2019, 12/31/2019, 08/03/2020, 06/11/2021   Pneumococcal Conjugate-13 09/20/2015   Pneumococcal Polysaccharide-23 11/01/2009   Td 08/10/2009, 04/11/2020   Zoster, Live 08/02/2011   Health Maintenance  Topic Date Due   COVID-19 Vaccine (5 - Booster for Pfizer series) 08/06/2021   Zoster Vaccines- Shingrix (1 of 2) 09/20/2023 (Originally 04/12/1955)   INFLUENZA VACCINE  07/01/2022   HEMOGLOBIN A1C  08/24/2022   OPHTHALMOLOGY EXAM  09/18/2022   FOOT EXAM  02/22/2023   URINE MICROALBUMIN  02/22/2023   MAMMOGRAM  03/22/2023   TETANUS/TDAP  04/11/2030   Pneumonia Vaccine 32+ Years old  Completed   DEXA SCAN  Completed   HPV VACCINES  Aged Out   Last colonoscopy: 2019 DONE Last mammogram: breast center, 03/2021, has scheduled 03/21/2022 DEXA: 10/2021, R fem T -1.6 < -2.1  Shingles/Zostavax: 2012 discussed will get eventually, had first shingrix was advised not to get the second by oncologist  Names of Other Physician/Practitioners you currently use: 1. [facility]- here for primary care 2. Dr. Nicki Reaper, eye doctor, last visit 09/18/2021 - bil mild retinopathy  3. Dr. Dellia Cloud, dentist, last visit 2023, goes q66m Patient Care Team: MUnk Pinto MD as PCP - GKinnie Scales OD as Referring Physician (Optometry) GCrista Luria MD as  Consulting Physician (Dermatology) GLatanya Maudlin MD as Consulting Physician (Orthopedic Surgery) KInda Castle MD (Inactive) as Consulting Physician (Gastroenterology) GHeath Lark MD as  Consulting Physician (Hematology and Oncology) Madelon Lips, MD as Consulting Physician (Nephrology) Carleene Mains, [facility] (Pharmacist)  Allergies Allergies  Allergen Reactions   Keflex [Cephalexin] Swelling    Tongue and throat swells   Meloxicam Swelling   Prednisone Swelling and Other (See Comments)    Can Not take High doses Cannot tolerate high doses Can Not take High doses   Premarin [Estrogens Conjugated] Hives   Trovan [Alatrofloxacin] Other (See Comments)    Unknown reaction    SURGICAL HISTORY She  has a past surgical history that includes Cataract extraction, bilateral (2014); Abdominal hysterectomy (1973); Incontinence surgery (2006); Knee arthroscopy (Right); Hand surgery (Left, 2012); Colonoscopy; Radioactive seed guided mastectomy with axillary sentinel lymph node biopsy (Left, 03/18/2016); Cardiac catheterization; Portacath placement (N/A, 04/14/2016); Port-a-cath removal (N/A, 11/27/2016); Mastopexy (Right, 06/16/2017); Breast biopsy (Left, 02/26/2016); Breast lumpectomy (Left, 03/18/2016); Reduction mammaplasty (Right, 2018); IR IMAGING GUIDED PORT INSERTION (04/24/2020); IR Paracentesis (05/08/2020); IR Paracentesis (05/16/2020); IR Paracentesis (05/25/2020); and Hip Arthroplasty (Right, 11/08/2021).   FAMILY HISTORY Her family history includes Alzheimer's disease in her father; Breast cancer (age of onset: 71) in her sister; Cirrhosis in her sister; Diabetes in her father; Epilepsy in some other family members; Esophageal cancer (age of onset: 60) in her maternal aunt; Heart Problems in her maternal grandmother; Heart attack (age of onset: 25) in her maternal uncle; Hypertension in her sister; Lung cancer in an other family member; Lung cancer (age of onset: 76) in her sister; Other in  her mother and sister; Other (age of onset: 49) in an other family member; Pancreatic cancer (age of onset: 70) in her cousin; Parkinson's disease in her maternal aunt; Stroke in her maternal grandmother; Stroke (age of onset: 43) in her mother.   SOCIAL HISTORY She  reports that she has never smoked. She has never used smokeless tobacco. She reports that she does not drink alcohol and does not use drugs.  MEDICARE WELLNESS OBJECTIVES: Physical activity:   Cardiac risk factors:   Depression/mood screen:      02/21/2022   10:12 AM  Depression screen PHQ 2/9  Decreased Interest 0  Down, Depressed, Hopeless 0  PHQ - 2 Score 0    ADLs:     11/08/2021    2:00 AM 09/19/2021   10:58 AM  In your present state of health, do you have any difficulty performing the following activities:  Hearing? 0 0  Vision? 0 0  Difficulty concentrating or making decisions? 0 0  Walking or climbing stairs? 1 0  Dressing or bathing? 1 0  Doing errands, shopping? 1 0     Cognitive Testing  Alert? Yes  Normal Appearance?Yes  Oriented to person? Yes  Place? Yes   Time? Yes  Recall of three objects?  Yes  Can perform simple calculations? Yes  Displays appropriate judgment?Yes  Can read the correct time from a watch face?Yes  EOL planning:       Review of Systems  Constitutional:  Negative for malaise/fatigue and weight loss.  HENT:  Negative for hearing loss and tinnitus.   Eyes:  Negative for blurred vision and double vision.  Respiratory:  Negative for cough, sputum production, shortness of breath and wheezing.   Cardiovascular:  Positive for leg swelling (RLE much improved). Negative for chest pain, palpitations, orthopnea, claudication and PND.  Gastrointestinal:  Positive for abdominal pain (intermittent brief RLQ with movement and deep palpation). Negative for blood in stool, constipation, diarrhea, heartburn, melena, nausea and vomiting.  Genitourinary: Negative.  Musculoskeletal:   Negative for falls, joint pain and myalgias.  Skin:  Negative for rash.  Neurological:  Positive for sensory change (bil feet/ankles, chronic following chemo). Negative for dizziness, tingling, weakness and headaches.  Endo/Heme/Allergies:  Negative for polydipsia.  Psychiatric/Behavioral: Negative.  Negative for depression, memory loss, substance abuse and suicidal ideas. The patient is not nervous/anxious and does not have insomnia.   All other systems reviewed and are negative.    Objective:   There were no vitals filed for this visit.  There is no height or weight on file to calculate BMI.  General appearance: alert, no distress, WD/WN, elder female HEENT: normocephalic, sclerae anicteric, TMs pearly, nares patent, no discharge or erythema, pharynx normal Oral cavity: MMM, no lesions Neck: supple, no lymphadenopathy, no thyromegaly, no masses Heart: RRR, normal S1, S2, no murmurs Lungs: CTA bilaterally, no wheezes, rhonchi, or rales Abdomen: +bs, soft, non tender, non distended, no masses, no hepatomegaly, no splenomegaly Musculoskeletal: nontender, no swelling, no obvious deformity Extremities: LLE without edema, RLE with scant swelling around ankle, no cyanosis, no clubbing Pulses: 2+ symmetric, upper and lower extremities, normal cap refill Neurological: alert, oriented x 3, CN2-12 intact, strength normal upper extremities and lower extremities, sensation normal UE, bil LE diminished through feet and ankles to monofilament and light touch, DTRs 2+ throughout, no cerebellar signs, gait slow steady Psychiatric: normal affect, behavior normal, pleasant  Gyn/ breasts: defer to GYN/onc, getting annual mammogram    Medicare Attestation I have personally reviewed: The patient's medical and social history Their use of alcohol, tobacco or illicit drugs Their current medications and supplements The patient's functional ability including ADLs,fall risks, home safety risks, cognitive,  and hearing and visual impairment Diet and physical activities Evidence for depression or mood disorders  The patient's weight, height, BMI, and visual acuity have been recorded in the chart.  I have made referrals, counseling, and provided education to the patient based on review of the above and I have provided the patient with a written personalized care plan for preventive services.      Izora Ribas, NP   06/06/2022

## 2022-06-09 ENCOUNTER — Telehealth: Payer: Self-pay | Admitting: *Deleted

## 2022-06-09 NOTE — Telephone Encounter (Signed)
Patient called - exposed to covid last week.  So far, home test NEG and no symptoms.  Husband now Positive and symptomatic - he contacted his PCP.  Appt Wednesday for CT and Friday with MD.  She wants to know if she should keep these appts or reschedule. Dr. [name] informed of patient questions. Contacted patient with Dr. [name] response - If she continues to test negative, no need to reschedule. Patient verbalized understanding of information.

## 2022-06-10 ENCOUNTER — Ambulatory Visit: Payer: PPO | Admitting: Adult Health

## 2022-06-10 DIAGNOSIS — Z79899 Other long term (current) drug therapy: Secondary | ICD-10-CM

## 2022-06-10 DIAGNOSIS — G620 Drug-induced polyneuropathy: Secondary | ICD-10-CM

## 2022-06-10 DIAGNOSIS — M8580 Other specified disorders of bone density and structure, unspecified site: Secondary | ICD-10-CM

## 2022-06-10 DIAGNOSIS — E1122 Type 2 diabetes mellitus with diabetic chronic kidney disease: Secondary | ICD-10-CM

## 2022-06-10 DIAGNOSIS — Z853 Personal history of malignant neoplasm of breast: Secondary | ICD-10-CM

## 2022-06-10 DIAGNOSIS — I700 Atherosclerosis of aorta: Secondary | ICD-10-CM

## 2022-06-10 DIAGNOSIS — Z0000 Encounter for general adult medical examination without abnormal findings: Secondary | ICD-10-CM

## 2022-06-10 DIAGNOSIS — M1000 Idiopathic gout, unspecified site: Secondary | ICD-10-CM

## 2022-06-10 DIAGNOSIS — I10 Essential (primary) hypertension: Secondary | ICD-10-CM

## 2022-06-10 DIAGNOSIS — E113293 Type 2 diabetes mellitus with mild nonproliferative diabetic retinopathy without macular edema, bilateral: Secondary | ICD-10-CM

## 2022-06-10 DIAGNOSIS — N1832 Chronic kidney disease, stage 3b: Secondary | ICD-10-CM

## 2022-06-10 DIAGNOSIS — D61818 Other pancytopenia: Secondary | ICD-10-CM

## 2022-06-10 DIAGNOSIS — C569 Malignant neoplasm of unspecified ovary: Secondary | ICD-10-CM

## 2022-06-10 DIAGNOSIS — E1169 Type 2 diabetes mellitus with other specified complication: Secondary | ICD-10-CM

## 2022-06-10 DIAGNOSIS — E559 Vitamin D deficiency, unspecified: Secondary | ICD-10-CM

## 2022-06-10 DIAGNOSIS — E1140 Type 2 diabetes mellitus with diabetic neuropathy, unspecified: Secondary | ICD-10-CM

## 2022-06-10 DIAGNOSIS — D630 Anemia in neoplastic disease: Secondary | ICD-10-CM

## 2022-06-11 ENCOUNTER — Ambulatory Visit (HOSPITAL_COMMUNITY)
Admission: RE | Admit: 2022-06-11 | Discharge: 2022-06-11 | Disposition: A | Discharge status: Home / Self Care | Payer: PPO | Source: Ambulatory Visit | Attending: Hematology and Oncology | Admitting: Hematology and Oncology

## 2022-06-11 DIAGNOSIS — C569 Malignant neoplasm of unspecified ovary: Secondary | ICD-10-CM | POA: Insufficient documentation

## 2022-06-11 DIAGNOSIS — I700 Atherosclerosis of aorta: Secondary | ICD-10-CM | POA: Insufficient documentation

## 2022-06-11 DIAGNOSIS — J984 Other disorders of lung: Secondary | ICD-10-CM | POA: Diagnosis not present

## 2022-06-11 DIAGNOSIS — N281 Cyst of kidney, acquired: Secondary | ICD-10-CM | POA: Diagnosis not present

## 2022-06-11 DIAGNOSIS — Z853 Personal history of malignant neoplasm of breast: Secondary | ICD-10-CM | POA: Diagnosis not present

## 2022-06-11 DIAGNOSIS — Z8543 Personal history of malignant neoplasm of ovary: Secondary | ICD-10-CM | POA: Diagnosis not present

## 2022-06-11 DIAGNOSIS — K8020 Calculus of gallbladder without cholecystitis without obstruction: Secondary | ICD-10-CM | POA: Insufficient documentation

## 2022-06-11 DIAGNOSIS — Z79899 Other long term (current) drug therapy: Secondary | ICD-10-CM | POA: Diagnosis not present

## 2022-06-11 DIAGNOSIS — Z90710 Acquired absence of both cervix and uterus: Secondary | ICD-10-CM | POA: Diagnosis not present

## 2022-06-11 DIAGNOSIS — D494 Neoplasm of unspecified behavior of bladder: Secondary | ICD-10-CM | POA: Diagnosis not present

## 2022-06-11 MED ORDER — SODIUM CHLORIDE (PF) 0.9 % IJ SOLN
INTRAMUSCULAR | Status: AC
  Filled 2022-06-11: qty 50

## 2022-06-11 MED ORDER — IOHEXOL 300 MG/ML  SOLN
75.0000 mL | Freq: Once | INTRAMUSCULAR | Status: AC | PRN
  Administered 2022-06-11: 75 mL via INTRAVENOUS

## 2022-06-13 ENCOUNTER — Other Ambulatory Visit: Payer: Self-pay

## 2022-06-13 ENCOUNTER — Encounter: Payer: Self-pay | Admitting: Hematology and Oncology

## 2022-06-13 ENCOUNTER — Inpatient Hospital Stay: Payer: PPO | Attending: Gynecologic Oncology | Admitting: Hematology and Oncology

## 2022-06-13 DIAGNOSIS — C50212 Malignant neoplasm of upper-inner quadrant of left female breast: Secondary | ICD-10-CM

## 2022-06-13 DIAGNOSIS — C561 Malignant neoplasm of right ovary: Secondary | ICD-10-CM | POA: Insufficient documentation

## 2022-06-13 DIAGNOSIS — Z853 Personal history of malignant neoplasm of breast: Secondary | ICD-10-CM | POA: Diagnosis not present

## 2022-06-13 DIAGNOSIS — Z923 Personal history of irradiation: Secondary | ICD-10-CM | POA: Insufficient documentation

## 2022-06-13 DIAGNOSIS — Z9221 Personal history of antineoplastic chemotherapy: Secondary | ICD-10-CM | POA: Insufficient documentation

## 2022-06-13 DIAGNOSIS — Z79899 Other long term (current) drug therapy: Secondary | ICD-10-CM | POA: Insufficient documentation

## 2022-06-13 DIAGNOSIS — Z170 Estrogen receptor positive status [ER+]: Secondary | ICD-10-CM | POA: Diagnosis not present

## 2022-06-13 DIAGNOSIS — C786 Secondary malignant neoplasm of retroperitoneum and peritoneum: Secondary | ICD-10-CM | POA: Diagnosis not present

## 2022-06-13 DIAGNOSIS — C569 Malignant neoplasm of unspecified ovary: Secondary | ICD-10-CM

## 2022-06-13 MED ORDER — ONDANSETRON HCL 8 MG PO TABS: 8.0000 mg | ORAL_TABLET | Freq: Three times a day (TID) | ORAL | 1 refills | Status: DC | PRN

## 2022-06-13 NOTE — Assessment & Plan Note (Addendum)
I have reviewed multiple imaging studies with the patient and her husband, going back to end of last year She had very subtle but clear signs of disease progression So far, she is not too symptomatic She has been exposed to a lot of cycles of carboplatin Moving forward, with changes supportive of disease progression, I am reluctant to recommend her to continue on current regimen We reviewed the guidelines We discussed the risk, benefits, side effects of single agent docetaxel, Doxil as well as topotecan She has pre-existing severe peripheral neuropathy and docetaxel might exacerbate that She has been exposed to Adriamycin in the past for her breast cancer regimen and given her treatment with Doxil could increase risk of cardiomyopathy With topotecan, I expect risk of severe pancytopenia that could certainly increase her risk of infection and reduced quality of life Alternatively, I also recommend consideration for second opinion at [facility] or [facility], it appears that the patient is interested to continue on treatment other than supportive care only She will discuss this with her family and we will touch base again on Monday for final decision Regardless of her choice of treatment, the goal of treatment is strictly palliative in nature, and an estimated the chance of response in less than 20%

## 2022-06-13 NOTE — Progress Notes (Signed)
[place] OFFICE PROGRESS NOTE  Patient Care Team: [name], MD as PCP - Kinnie Scales, OD as Referring Physician (Optometry) [name], MD as Consulting Physician (Dermatology) Latanya Maudlin, MD as Consulting Physician (Orthopedic Surgery) Inda Castle, MD (Inactive) as Consulting Physician (Gastroenterology) Heath Lark, MD as Consulting Physician (Hematology and Oncology) Madelon Lips, MD as Consulting Physician (Nephrology) Carleene Mains, [facility] (Pharmacist)  ASSESSMENT & PLAN:  Ovarian cancer [facility]) I have reviewed multiple imaging studies with the patient and her husband, going back to end of last year She had very subtle but clear signs of disease progression So far, she is not too symptomatic She has been exposed to a lot of cycles of carboplatin Moving forward, with changes supportive of disease progression, I am reluctant to recommend her to continue on current regimen We reviewed the guidelines We discussed the risk, benefits, side effects of single agent docetaxel, Doxil as well as topotecan She has pre-existing severe peripheral neuropathy and docetaxel might exacerbate that She has been exposed to Adriamycin in the past for her breast cancer regimen and given her treatment with Doxil could increase risk of cardiomyopathy With topotecan, I expect risk of severe pancytopenia that could certainly increase her risk of infection and reduced quality of life Alternatively, I also recommend consideration for second opinion at [facility] or [facility], it appears that the patient is interested to continue on treatment other than supportive care only She will discuss this with her family and we will touch base again on Monday for final decision Regardless of her choice of treatment, the goal of treatment is strictly palliative in nature, and an estimated the chance of response in less than 20%  No orders of the defined types were placed in this  encounter.   All questions were answered. The patient knows to call the clinic with any problems, questions or concerns. The total time spent in the appointment was 40 minutes encounter with patients including review of chart and various tests results, discussions about plan of care and coordination of care plan   Heath Lark, MD 06/13/2022 12:10 PM  INTERVAL HISTORY: Please see below for problem oriented charting. she returns for treatment follow-up and review of CT imaging results Her husband is present She denies side effects from recent treatment No recent nausea, bloating or changes in bowel habits We spent majority of the time reviewing imaging studies and discuss options  REVIEW OF SYSTEMS:   Constitutional: Denies fevers, chills or abnormal weight loss Eyes: Denies blurriness of vision Ears, nose, mouth, throat, and face: Denies mucositis or sore throat Respiratory: Denies cough, dyspnea or wheezes Cardiovascular: Denies palpitation, chest discomfort or lower extremity swelling Gastrointestinal:  Denies nausea, heartburn or change in bowel habits Skin: Denies abnormal skin rashes Lymphatics: Denies new lymphadenopathy or easy bruising Neurological:Denies numbness, tingling or new weaknesses Behavioral/Psych: Mood is stable, no new changes  All other systems were reviewed with the patient and are negative.  I have reviewed the past medical history, past surgical history, social history and family history with the patient and they are unchanged from previous note.  ALLERGIES:  is allergic to keflex [cephalexin], meloxicam, prednisone, premarin [estrogens conjugated], and trovan [alatrofloxacin].  MEDICATIONS:  Current Outpatient Medications  Medication Sig Dispense Refill   acetaminophen (TYLENOL) 325 MG tablet Take 325 mg by mouth every 6 (six) hours as needed for headache.     allopurinol (ZYLOPRIM) 100 MG tablet Take 1 tablet (100 mg total) by mouth  daily. FOR GOUT  PREVENTION, 90 tablet 3   blood glucose meter kit and supplies KIT Dispense based on patient and insurance preference. Use to check blood sugar once daily. Dx: E11.22, N18.30 1 each 12   Cholecalciferol (VITAMIN D3) 5000 units CAPS Take 5,000 Units by mouth daily.     cyanocobalamin 1000 MCG tablet Take 1,000 mcg by mouth daily.     ezetimibe (ZETIA) 10 MG tablet TAKE 1 TABLET BY MOUTH DAILY FOR CHOLESTEROL 90 tablet 3   gabapentin (NEURONTIN) 300 MG capsule Take 1 capsule 4 x  /day for Pain 360 capsule 3   glimepiride (AMARYL) 1 MG tablet Take 1 tab by mouth in the morning if fasting is running 150+. Please do not take this medication if you are ill or not eating as this can cause a low blood sugar. (Patient taking differently: Take 1 mg by mouth See admin instructions. Take 1 tab by mouth daily as needed if fasting is running 150+. Please do not take this medication if you are ill or not eating as this can cause a low blood sugar.) 90 tablet 1   glucose blood test strip Use to test blood sugar once daily. Dx: E11.22, N18.30 100 each 12   hydrochlorothiazide (HYDRODIURIL) 25 MG tablet Take 1 tablet Daily for BP & Fluid Retention                                        /                          TAKE                           BY                   MOUTH 90 tablet 3   Lancets MISC Use to test blood sugar once daily. Dx: E11.22, N18.30 200 each 11   lidocaine-prilocaine (EMLA) cream Apply 1 application. topically daily as needed (for port access). 30 g 3   loratadine (CLARITIN) 10 MG tablet Take 10 mg by mouth daily as needed for allergies.     metoprolol succinate (TOPROL-XL) 25 MG 24 hr tablet Take  1 tablet  Daily  for BP       /TAKE 1 TABLET BY MOUTH DAILY FOR BLOOD PRESSURE (Patient taking differently: Take 25 mg by mouth daily.) 90 tablet 3   ondansetron (ZOFRAN) 8 MG tablet Take 1 tablet (8 mg total) by mouth every 8 (eight) hours as needed for refractory nausea / vomiting. 30 tablet 1    PFIZER-BIONT COVID-19 VAC-TRIS SUSP injection      polyethylene glycol (MIRALAX) 17 g packet Take 17 g by mouth 2 (two) times daily. 17 grams in 6 oz of favorite drink twice a day until bowel movement.  LAXITIVE.  Restart if two days since last bowel movement 14 packet 0   rosuvastatin (CRESTOR) 5 MG tablet TAKE 1 TABLET BY MOUTH IN THE EVENING THREE TIMES WEEKLY FOR CHOLESTEROL 36 tablet 3   trolamine salicylate (ASPERCREME) 10 % cream Apply 1 application topically as needed for muscle pain (Use if you still feel tingles in feet in the morning after taking Gabapentin).     vitamin C (ASCORBIC ACID) 500 MG tablet Take 500 mg by mouth every evening.  No current facility-administered medications for this visit.    SUMMARY OF ONCOLOGIC HISTORY: Oncology History Overview Note  HRD positive Progressed and intolerant to olaparib Progressed on carboplatin, taxol and gemzar   History of left breast cancer  02/26/2016 Initial Diagnosis   Left breast biopsy 11:00 position: invasive ductal carcinoma with DCIS, ER 90%, PR 10%, HER-2 negative, Ki-67 30%, grade 2, 2.2 cm palpable lesion T2 N0 stage II a clinical stage   03/18/2016 Surgery   Left lumpectomy: Invasive ductal carcinoma, grade 2, 6.3 cm, with high-grade DCIS, margins negative, 0/4 lymph nodes negative, ER 90%, via 10%, HER-2 negative ratio 0.97, Ki-67 30%, T3 N0 stage IIB   03/25/2016 Procedure   Genetic testing is negative for pathogenic mutations within any of the 20 Genes on the breast/ovarian cancer panel   04/04/2016 Oncotype testing   Oncotype DX recurrence score 37, 25% 10 year distant risk of recurrence   04/17/2016 - 07/31/2016 Chemotherapy   Adjuvant chemotherapy with dose dense Adriamycin and Cytoxan followed by Abraxane weekly 8 ( discontinued due to neuropathy)   09/01/2016 - 09/26/2016 Radiation Therapy   Adj XRT 1) Left breast: 42.5 Gy in 17 fractions. 2) Left breast boost: 7.5 Gy in 3 fractions.   12/02/2016 -   Anti-estrogen oral therapy   Anastrozole 1 mg daily   09/05/2021 -  Chemotherapy   Patient is on Treatment Plan : OVARIAN RECURRENT 3RD LINE Carboplatin D1 / Gemcitabine D1,8 (4/800) q21d     Ovarian cancer ([place])  04/03/2020 Imaging   US pelvis Complex cystic and solid mass in LEFT adnexa 12.5 cm diameter question cystic ovarian neoplasm; recommend correlation with serum tumor markers and further evaluation by MR imaging with and without contrast.     04/04/2020 Imaging   US venous Doppler No evidence of deep venous thrombosis in the right lower extremity. Left common femoral vein also patent.   04/15/2020 Imaging   MRI pelvis 1. Large complex solid and cystic mass arising from the right adnexa measuring 10.8 by 11.5 by 11.1 cm. This has an aggressive appearance with extensive enhancing mural soft tissue components. Findings are highly suspicious for malignant ovarian neoplasm. 2. Extensive bilateral retroperitoneal and bilateral iliac adenopathy compatible with metastatic disease. 3. Signs of extensive peritoneal carcinomatosis including ascites, enhancement, thickening and nodularity of the peritoneal reflections, omental caking and bulky peritoneal nodularity. 4. Suspected serosal involvement of the dome of bladder with loss of normal fat plane. Cannot rule out mural invasion by tumor.   04/17/2020 Tumor Marker   Patient's tumor was tested for the following markers: CA-125 Results of the tumor marker test revealed 1762.   04/20/2020 Cancer Staging   Staging form: Ovary, Fallopian Tube, and Primary Peritoneal Carcinoma, AJCC 8th Edition - Clinical: FIGO Stage IIIC (cT3, cN1, cM0) - Signed by Heath Lark, MD on 04/20/2020   04/23/2020 Imaging   1. Redemonstrated dominant mixed solid and cystic mass arising from the vicinity of the right ovary measuring at least 11.5 x 10.7 cm, not significantly changed compared to prior MR, and consistent with primary ovarian malignancy.  2. Numerous bulky  retroperitoneal, bilateral iliac, and pelvic sidewall lymph nodes. 3. Moderate volume ascites throughout the abdomen and pelvis with subtle thickening and nodularity throughout the peritoneum, and extensive bulky nodular metastatic disease of the omentum. 4. Constellation of findings is consistent with advanced nodal and peritoneal metastatic disease. 5. There are prominent subcentimeter epicardial lymph nodes, nonspecific although suspicious for nodal metastatic disease. No definite nodal metastatic disease  in the chest. Attention on follow-up. 6. There are multiple small subpleural nodules at the right lung base overlying the diaphragm, measuring up to 7 mm. These are generally nonspecific and less favored to represent pulmonary metastatic disease given distribution. Attention on follow-up. 7. Somewhat coarse contour of the liver, suggestive of cirrhosis, although out without overt morphologic stigmata. 8. Aortic Atherosclerosis (ICD10-I70.0).     04/24/2020 Procedure   Successful placement of a right internal jugular approach power injectable Port-A-Cath. The catheter is ready for immediate use.     05/02/2020 Tumor Marker   Patient's tumor was tested for the following markers: CA-125 Results of the tumor marker test revealed 1921   05/03/2020 - 12/12/2020 Chemotherapy   The patient had carboplatin and taxol for chemotherapy treatment.     05/08/2020 Procedure   Successful ultrasound-guided paracentesis yielding 2.6 liters of peritoneal fluid.   05/16/2020 Procedure   Successful ultrasound-guided paracentesis yielding 3.2 liters of peritoneal fluid   05/25/2020 Procedure   Successful ultrasound-guided paracentesis yielding 3 liters of peritoneal fluid.     06/01/2020 Procedure   Successful ultrasound-guided therapeutic paracentesis yielding 1.7 liters of peritoneal fluid.   06/14/2020 Tumor Marker   Patient's tumor was tested for the following markers: CA-125 Results of the tumor marker  test revealed 1309   06/20/2020 Imaging   1. Dominant mixed cystic and solid mass in the central pelvis is stable in the interval. 2. Clear interval decrease in retroperitoneal and pelvic sidewall lymphadenopathy. The bulky omental disease has also clearly decreased in the interval. 3. Interval decrease in ascites. 4. Stable 8 mm subpleural nodule along the diaphragm. 5. Aortic Atherosclerosis (ICD10-I70.0).   07/10/2020 Tumor Marker   Patient's tumor was tested for the following markers: CA-125 Results of the tumor marker test revealed 220   08/03/2020 Tumor Marker   Patient's tumor was tested for the following markers: CA-125 Results of the tumor marker test revealed 53.3.   09/03/2020 Tumor Marker   Patient's tumor was tested for the following markers: CA-125 Results of the tumor marker test revealed 29.7   09/20/2020 Imaging   1. Substantial improvement. The right ovarian cystic and solid mass is half the volume that it measured on 06/20/2020. Similar reduction in the bulk of the omental caking of tumor. Prior ascites is resolved and the retroperitoneal adenopathy is markedly improved. 2. Other imaging findings of potential clinical significance: Stable pleural-based nodularity along the right hemidiaphragm measuring about 1.1 by 0.7 by 0.3 cm. Stable hypodense lesion of the right kidney upper pole, probably a cyst although the configuration of this lesion makes it difficult to obtain accurate density measurements. Left ovarian cyst, stable. Chondrocalcinosis involving the acetabular labra. Mild lumbar spondylosis and degenerative disc disease. 3. Aortic atherosclerosis.     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 (stage ypT3c), predominantly viable with  focal fibrosis and hemosiderin laden macrophages (possible mild treatment effect)  B: Ovary and fallopian tube, right, salpingo-oophorectomy - High grade serous carcinoma involving right ovary and fallopian tube with surface involvement, size up to 8 cm  - Areas of necrosis and hemosiderin laden macrophages suggestive of treatment effect - 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 - See synoptic report and comment  C: Ovary and fallopian tube, left, salpingo-oophorectomy - High grade serous carcinoma involving the  left fallopian tube, size up to 0.5 cm - Ovary with no definite involvement by carcinoma identified - Nodular area of endometriosis and peritoneal inclusion cyst also present - See synoptic report and comment   Procedure:    Bilateral salpingo-oophorectomy    Procedure:    Omentectomy    Specimen Integrity of Right Ovary:    Intact with surface involvement by tumor    Specimen Integrity of Left Ovary:    Capsule intact   TUMOR Tumor Site:    Right fallopian tube: favor as primary site; also involves right ovary and left fallopian tube  Histologic Type:    Serous carcinoma  Histologic Grade:    High grade  Tumor Size:    Greatest Dimension (Centimeters): in fallopian tube: 1.0 cm; in ovary: 8.0 cm Ovarian Surface Involvement:    Present    Laterality:    Right  Fallopian Tube Surface Involvement:    Present    Laterality:    Bilateral  Other Tissue / Organ Involvement:    Right ovary  Other Tissue / Organ Involvement:    Right fallopian tube  Other Tissue / Organ Involvement:    Left fallopian tube  Other Tissue / Organ Involvement:    Omentum  Largest Extrapelvic Peritoneal Focus:    Macroscopic (greater than 2 cm)  Peritoneal / Ascitic Fluid:    Not submitted / unknown  Pleural Fluid:    Not submitted / unknown  Treatment Effect:    No definite or minimal response identified (chemotherapy response score [CRS] 1)   LYMPH  NODES Regional Lymph Nodes:    No lymph nodes submitted or found   PATHOLOGIC STAGE CLASSIFICATION (pTNM, AJCC 8th Edition) TNM Descriptors:    y (post-treatment)  Primary Tumor (pT):    pT3c  Regional Lymph Nodes (pN):    pNX   FIGO STAGE FIGO Stage:    IIIC   ADDITIONAL FINDINGS Additional Findings:    Serous tubal intraepithelial carcinoma (STIC)  Additional Findings:    Left ovary with no definite carcinoma identified, left-sided nodule of endometriosis and peritoneal inclusion cyst    10/18/2020 - 10/20/2020 Hospital Admission   She was admitted to [facility] for interval debulking surgery   11/19/2020 Tumor Marker   Patient's tumor was tested for the following markers: CA-125. Results of the tumor marker test revealed 38.3   12/12/2020 Tumor Marker   Patient's tumor was tested for the following markers CA-125. Results of the tumor marker test revealed 42.3.   01/02/2021 Tumor Marker   Patient's tumor was tested for the following markers: CA-125. Results of the tumor marker test revealed 37.3   01/29/2021 Tumor Marker   Patient's tumor was tested for the following markers: CA-125 Results of the tumor marker test revealed 44.3   01/29/2021 Imaging   1. Interval increase in loculated appearing ascites in the low central abdomen and pelvis. 2. There is extensive peritoneal nodularity surrounding this fluid, the nodularity itself not appreciably changed in appearance compared to prior examination. 3. Additional peritoneal nodularity and/or mesenteric lymph nodes are unchanged. 4. Interval decrease in size of a low-attenuation nodule overlying the right hemidiaphragm, now measuring 1.2 cm, previously 1.8 cm when measured similarly. Findings are consistent with treatment response of a metastatic nodule. No other evidence of intrathoracic metastatic disease. 5. Status post hysterectomy, oophorectomy, and omentectomy.   01/31/2021 - 08/26/2021 Chemotherapy   She is started on  Olaparib       03/04/2021 Tumor Marker  Patient's tumor was tested for the following markers: CA-125 Results of the tumor marker test revealed 39.7   04/01/2021 Tumor Marker   Patient's tumor was tested for the following markers: CA-125 Results of the tumor marker test revealed 32.3   05/10/2021 Imaging   1. No significant change in peritoneal carcinomatosis since previous study of 3 months ago, primarily in the pelvis where there is complex fluid and peritoneal nodularity. 2. No evidence of solid visceral organ metastasis. No evidence of bowel or ureteral obstruction. 3.  Aortic Atherosclerosis (ICD10-I70.0).   06/13/2021 Tumor Marker   Patient's tumor was tested for the following markers: CA-125. Results of the tumor marker test revealed 48.3.   07/29/2021 Tumor Marker   Patient's tumor was tested for the following markers: CA-125. Results of the tumor marker test revealed 54.   08/25/2021 Imaging   1. There is extensive peritoneal soft tissue thickening and nodularity, particularly about the low right hemipelvis, which is similar to prior examination. Some small peritoneal nodules throughout the abdomen and pelvis are slightly enlarged, other nodules are unchanged. 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 2. No significant change in a subpleural nodule overlying the right hemidiaphragm. 3. Unchanged, loculated small volume pelvic ascites. 4. Status post hysterectomy, oophorectomy, and omentectomy. 5. Coronary artery disease.   Aortic Atherosclerosis (ICD10-I70.0).     08/26/2021 Tumor Marker   Patient's tumor was tested for the following markers: CA-125. Results of the tumor marker test revealed 56.   09/04/2021 Tumor Marker   Patient's tumor was tested for the following markers: CA-125. Results of the tumor marker test revealed 55.4.   09/05/2021 -  Chemotherapy   Patient is on Treatment Plan : OVARIAN RECURRENT 3RD LINE  Carboplatin D1 / Gemcitabine D1,8 (4/800) q21d     10/07/2021 Tumor Marker   Patient's tumor was tested for the following markers: CA-125. Results of the tumor marker test revealed 51.9.   11/05/2021 Tumor Marker   Patient's tumor was tested for the following markers: CA-125. Results of the tumor marker test revealed 45.6.   11/27/2021 Imaging   1. Overall stable to slightly improved ill-defined soft tissue density throughout the pelvis. 2. Slight interval decrease in size of the omental lesions. 3. Stable free pelvic fluid. 4. New surgical changes from a right hip replacement. 5. New nodular and somewhat triangular density at the left lung base is likely an area of atelectasis. 6. Aortic atherosclerosis.   12/24/2021 Tumor Marker   Patient's tumor was tested for the following markers: CA-125. Results of the tumor marker test revealed 39.6.   02/04/2022 Tumor Marker   Patient's tumor was tested for the following markers: CA-125. Results of the tumor marker test revealed 36.3.   03/05/2022 Imaging   1. Similar to mild interval increase in size of right pericolic goiter nodules and omental nodules. 2. Similar appearance of the amorphous soft tissue within the pelvis. There is decreased fluid component   03/06/2022 Tumor Marker   Patient's tumor was tested for the following markers: CA-125. Results of the tumor marker test revealed 56.2.   04/07/2022 Tumor Marker   Patient's tumor was tested for the following markers: CA-125. Results of the tumor marker test revealed 65.5.   05/13/2022 Tumor Marker   Patient's tumor was tested for the following markers: CA-125. Results of the tumor marker test revealed 66.5.   06/12/2022 Imaging   1. Slight increase in prominence of the peritoneal and omental tumor deposits, which are most  thickly banded in the lower pelvis. 2. Questionable wall thickening in the descending duodenum, cannot exclude duodenitis. 3. Other imaging findings of potential  clinical significance: Bibasilar scarring. Aortic Atherosclerosis (ICD10-I70.0). Cholelithiasis.     PHYSICAL EXAMINATION: ECOG PERFORMANCE STATUS: 1 - Symptomatic but completely ambulatory  Vitals:   06/13/22 0934  BP: 134/64  Pulse: 81  Resp: 18  Temp: 98.2 F (36.8 C)  SpO2: 98%   Filed Weights   06/13/22 0934  Weight: 142 lb 12.8 oz (64.8 kg)    GENERAL:alert, no distress and comfortable NEURO: alert & oriented x 3 with fluent speech, no focal motor/sensory deficits  LABORATORY DATA:  I have reviewed the data as listed    Component Value Date/Time   NA 141 05/21/2022 0906   NA 141 11/06/2016 1102   K 3.4 (L) 05/21/2022 0906   K 3.9 11/06/2016 1102   CL 106 05/21/2022 0906   CO2 30 05/21/2022 0906   CO2 27 11/06/2016 1102   GLUCOSE 165 (H) 05/21/2022 0906   GLUCOSE 130 11/06/2016 1102   BUN 24 (H) 05/21/2022 0906   BUN 18.0 11/06/2016 1102   CREATININE 1.25 (H) 05/21/2022 0906   CREATININE 1.42 (H) 02/21/2021 1414   CREATININE 1.3 (H) 11/06/2016 1102   CALCIUM 9.8 05/21/2022 0906   CALCIUM 10.2 11/06/2016 1102   PROT 6.9 05/21/2022 0906   PROT 7.1 11/06/2016 1102   ALBUMIN 3.5 05/21/2022 0906   ALBUMIN 3.7 11/06/2016 1102   AST 36 05/21/2022 0906   AST 52 (H) 11/06/2016 1102   ALT 15 05/21/2022 0906   ALT 58 (H) 11/06/2016 1102   ALKPHOS 108 05/21/2022 0906   ALKPHOS 159 (H) 11/06/2016 1102   BILITOT 0.6 05/21/2022 0906   BILITOT 0.53 11/06/2016 1102   GFRNONAA 43 (L) 05/21/2022 0906   GFRNONAA 34 (L) 02/21/2021 1414   GFRAA 40 (L) 02/21/2021 1414    No results found for: "SPEP", "UPEP"  Lab Results  Component Value Date   WBC 4.5 05/21/2022   NEUTROABS 2.9 05/21/2022   HGB 9.9 (L) 05/21/2022   HCT 29.6 (L) 05/21/2022   MCV 99.0 05/21/2022   PLT 90 (L) 05/21/2022      Chemistry      Component Value Date/Time   NA 141 05/21/2022 0906   NA 141 11/06/2016 1102   K 3.4 (L) 05/21/2022 0906   K 3.9 11/06/2016 1102   CL 106 05/21/2022 0906    CO2 30 05/21/2022 0906   CO2 27 11/06/2016 1102   BUN 24 (H) 05/21/2022 0906   BUN 18.0 11/06/2016 1102   CREATININE 1.25 (H) 05/21/2022 0906   CREATININE 1.42 (H) 02/21/2021 1414   CREATININE 1.3 (H) 11/06/2016 1102      Component Value Date/Time   CALCIUM 9.8 05/21/2022 0906   CALCIUM 10.2 11/06/2016 1102   ALKPHOS 108 05/21/2022 0906   ALKPHOS 159 (H) 11/06/2016 1102   AST 36 05/21/2022 0906   AST 52 (H) 11/06/2016 1102   ALT 15 05/21/2022 0906   ALT 58 (H) 11/06/2016 1102   BILITOT 0.6 05/21/2022 0906   BILITOT 0.53 11/06/2016 1102       RADIOGRAPHIC STUDIES: I have reviewed multiple imaging studies with the patient and her husband I have personally reviewed the radiological images as listed and agreed with the findings in the report. CT ABDOMEN PELVIS W CONTRAST  Result Date: 06/12/2022 CLINICAL DATA:  Ovarian cancer restaging. History of left breast cancer. Ongoing chemotherapy. * Tracking Code: BO * EXAM:  CT ABDOMEN AND PELVIS WITH CONTRAST TECHNIQUE: Multidetector CT imaging of the abdomen and pelvis was performed using the standard protocol following bolus administration of intravenous contrast. RADIATION DOSE REDUCTION: This exam was performed according to the departmental dose-optimization program which includes automated exposure control, adjustment of the mA and/or kV according to patient size and/or use of iterative reconstruction technique. CONTRAST:  14m OMNIPAQUE IOHEXOL 300 MG/ML  SOLN COMPARISON:  Multiple exams, including 03/05/2022 FINDINGS: Lower chest: Descending thoracic aortic atherosclerotic calcification. Mild scarring in the right middle lobe and left lower lobe as well as the lingula, unchanged. Hepatobiliary: Mildly contracted gallbladder. Dependent gallstones in the gallbladder. Cannot exclude gallbladder wall thickening given the degree of contraction. No focal hepatic lesion is identified. No biliary dilatation. Pancreas: Unremarkable Spleen: The spleen  measures 11.8 by 5.9 by 11.5 cm (volume = 420 cm^3), upper normal size. No focal splenic lesion. Adrenals/Urinary Tract: Bladder and distal ureters partially obscured by streak artifact from the patient's left hip implant. No hydronephrosis or hydroureter. Complex but benign appearing right kidney upper pole cyst. 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 Stomach/Bowel: Questionable wall thickening of the descending duodenum, cannot exclude duodenitis. Lower pelvic bowel partially obscured by streak artifact. No dilated bowel observed. Vascular/Lymphatic: Atherosclerosis is present, including aortoiliac atherosclerotic disease. Left periaortic lymph node 0.8 cm in short axis on image 26 series 2, formerly the same. Mild periaortic retroperitoneal stranding. Right external iliac node 0.9 cm in short axis on image 64 series 2, formerly 0.7 cm. Reproductive: Uterus absent. Some of the pelvic soft tissue density discussed in the section below abuts the right side of the vaginal cuff. Other: There is a band of tumor rind in the pelvis eccentric to the right measuring about 1.5 cm and thickness on image 60 of series 2, formerly 1.2 cm in this vicinity. This band of tumor extends down towards the right vaginal cuff in the roof of the urinary bladder and subjectively appears mildly increased from prior. This also abuts the right side of the rectum Scattered tumor nodularity along the omentum and pelvic services. Index left omental nodule 0.8 cm transversely on image 37 series 2, formerly 0.7 cm. A tumor deposit along the right lower paracolic gutter measures 0.7 cm in short axis on image 54 series 2, formerly the same. There is some mild stranding in the mesentery. Tumor nodularity just below the inferior right hepatic lobe is noted with the dominant nodule in this vicinity measuring 1.2 by 0.9 cm on image 38 series 2, formerly 1.0 by 0.8 cm. Musculoskeletal: Right total hip prosthesis. 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 IMPRESSION: 1. Slight increase in prominence of the peritoneal and omental tumor deposits, which are most thickly banded in the lower pelvis. 2. Questionable wall thickening in the descending duodenum, cannot exclude duodenitis. 3. Other imaging findings of potential clinical significance: Bibasilar scarring. Aortic Atherosclerosis (ICD10-I70.0). Cholelithiasis. Electronically Signed   By: WVan ClinesM.D.   On: 06/12/2022 15:20

## 2022-06-16 ENCOUNTER — Telehealth: Payer: Self-pay | Admitting: *Deleted

## 2022-06-16 ENCOUNTER — Ambulatory Visit (INDEPENDENT_AMBULATORY_CARE_PROVIDER_SITE_OTHER): Payer: PPO | Admitting: Nurse Practitioner

## 2022-06-16 VITALS — BP 130/70 | HR 82 | Temp 96.8°F | Ht 64.0 in | Wt 144.5 lb

## 2022-06-16 DIAGNOSIS — E1122 Type 2 diabetes mellitus with diabetic chronic kidney disease: Secondary | ICD-10-CM | POA: Diagnosis not present

## 2022-06-16 DIAGNOSIS — Z853 Personal history of malignant neoplasm of breast: Secondary | ICD-10-CM

## 2022-06-16 DIAGNOSIS — M8580 Other specified disorders of bone density and structure, unspecified site: Secondary | ICD-10-CM

## 2022-06-16 DIAGNOSIS — R911 Solitary pulmonary nodule: Secondary | ICD-10-CM

## 2022-06-16 DIAGNOSIS — D61818 Other pancytopenia: Secondary | ICD-10-CM

## 2022-06-16 DIAGNOSIS — N183 Chronic kidney disease, stage 3 unspecified: Secondary | ICD-10-CM | POA: Diagnosis not present

## 2022-06-16 DIAGNOSIS — Z79899 Other long term (current) drug therapy: Secondary | ICD-10-CM | POA: Diagnosis not present

## 2022-06-16 DIAGNOSIS — C569 Malignant neoplasm of unspecified ovary: Secondary | ICD-10-CM | POA: Diagnosis not present

## 2022-06-16 DIAGNOSIS — E113293 Type 2 diabetes mellitus with mild nonproliferative diabetic retinopathy without macular edema, bilateral: Secondary | ICD-10-CM

## 2022-06-16 DIAGNOSIS — E559 Vitamin D deficiency, unspecified: Secondary | ICD-10-CM

## 2022-06-16 DIAGNOSIS — E1169 Type 2 diabetes mellitus with other specified complication: Secondary | ICD-10-CM

## 2022-06-16 DIAGNOSIS — K089 Disorder of teeth and supporting structures, unspecified: Secondary | ICD-10-CM

## 2022-06-16 DIAGNOSIS — I700 Atherosclerosis of aorta: Secondary | ICD-10-CM

## 2022-06-16 DIAGNOSIS — M1000 Idiopathic gout, unspecified site: Secondary | ICD-10-CM

## 2022-06-16 DIAGNOSIS — M7501 Adhesive capsulitis of right shoulder: Secondary | ICD-10-CM

## 2022-06-16 DIAGNOSIS — Z0000 Encounter for general adult medical examination without abnormal findings: Secondary | ICD-10-CM | POA: Diagnosis not present

## 2022-06-16 DIAGNOSIS — T451X5A Adverse effect of antineoplastic and immunosuppressive drugs, initial encounter: Secondary | ICD-10-CM

## 2022-06-16 DIAGNOSIS — G620 Drug-induced polyneuropathy: Secondary | ICD-10-CM

## 2022-06-16 DIAGNOSIS — I10 Essential (primary) hypertension: Secondary | ICD-10-CM | POA: Diagnosis not present

## 2022-06-16 NOTE — Patient Instructions (Signed)
Bevacizumab injection What is this medication? BEVACIZUMAB (be va SIZ yoo mab) is a monoclonal antibody. It is used to treat many types of cancer. This medicine may be used for other purposes; ask your health care provider or pharmacist if you have questions. COMMON BRAND NAME(S): Alymsys, Avastin, MVASI, Noah Charon What should I tell my care team before I take this medication? They need to know if you have any of these conditions: diabetes heart disease high blood pressure history of coughing up blood prior anthracycline chemotherapy (e.g., doxorubicin, daunorubicin, epirubicin) recent or ongoing radiation therapy recent or planning to have surgery stroke an unusual or allergic reaction to bevacizumab, hamster proteins, mouse proteins, other medicines, foods, dyes, or preservatives pregnant or trying to get pregnant breast-feeding How should I use this medication? This medicine is for infusion into a vein. It is given by a health care professional in a hospital or clinic setting. Talk to your pediatrician regarding the use of this medicine in children. Special care may be needed. Overdosage: If you think you have taken too much of this medicine contact a poison control center or emergency room at once. NOTE: This medicine is only for you. Do not share this medicine with others. What if I miss a dose? It is important not to miss your dose. Call your doctor or health care professional if you are unable to keep an appointment. What may interact with this medication? Interactions are not expected. This list may not describe all possible interactions. Give your health care provider a list of all the medicines, herbs, non-prescription drugs, or dietary supplements you use. Also tell them if you smoke, drink alcohol, or use illegal drugs. Some items may interact with your medicine. What should I watch for while using this medication? Your condition will be monitored carefully while you are  receiving this medicine. You will need important blood work and urine testing done while you are taking this medicine. This medicine may increase your risk to bruise or bleed. Call your doctor or health care professional if you notice any unusual bleeding. Before having surgery, talk to your health care provider to make sure it is ok. This drug can increase the risk of poor healing of your surgical site or wound. You will need to stop this drug for 28 days before surgery. After surgery, wait at least 28 days before restarting this drug. Make sure the surgical site or wound is healed enough before restarting this drug. Talk to your health care provider if questions. Do not become pregnant while taking this medicine or for 6 months after stopping it. Women should inform their doctor if they wish to become pregnant or think they might be pregnant. There is a potential for serious side effects to an unborn child. Talk to your health care professional or pharmacist for more information. Do not breast-feed an infant while taking this medicine and for 6 months after the last dose. This medicine has caused ovarian failure in some women. This medicine may interfere with the ability to have a child. You should talk to your doctor or health care professional if you are concerned about your fertility. What side effects may I notice from receiving this medication? Side effects that you should report to your doctor or health care professional as soon as possible: allergic reactions like skin rash, itching or hives, swelling of the face, lips, or tongue chest pain or chest tightness chills coughing up blood high fever seizures severe constipation signs and symptoms of bleeding  such as bloody or black, tarry stools; red or dark-brown urine; spitting up blood or brown material that looks like coffee grounds; red spots on the skin; unusual bruising or bleeding from the eye, gums, or nose signs and symptoms of a blood  clot such as breathing problems; chest pain; severe, sudden headache; pain, swelling, warmth in the leg signs and symptoms of a stroke like changes in vision; confusion; trouble speaking or understanding; severe headaches; sudden numbness or weakness of the face, arm or leg; trouble walking; dizziness; loss of balance or coordination stomach pain sweating swelling of legs or ankles vomiting weight gain Side effects that usually do not require medical attention (report to your doctor or health care professional if they continue or are bothersome): back pain changes in taste decreased appetite dry skin nausea tiredness This list may not describe all possible side effects. Call your doctor for medical advice about side effects. You may report side effects to FDA at 1-800-FDA-1088. Where should I keep my medication? This drug is given in a hospital or clinic and will not be stored at home. NOTE: This sheet is a summary. It may not cover all possible information. If you have questions about this medicine, talk to your doctor, pharmacist, or health care provider.  2023 Else[employer] Standard (2021-10-18 00:00:00)   Topotecan capsules What is this medication? TOPOTECAN (TOE poe TEE kan) is a chemotherapy drug. It is used to treat small cell lung cancer. This medicine may be used for other purposes; ask your health care provider or pharmacist if you have questions. COMMON BRAND NAME(S): Hycamtin What should I tell my care team before I take this medication? They need to know if you have any of these conditions: immune system problems infection (especially a virus infection such as chickenpox, cold sores, or herpes) kidney disease low blood counts, like low white cell, platelet, or red cell counts lung or breathing disease, like asthma scarring or thickening of the lungs an unusual or allergic reaction to topotecan, other medicines, foods, dyes, or preservatives pregnant or trying to get  pregnant breast-feeding How should I use this medication? Take this medicine by mouth with a glass of water. You can take it with or without food. Do not cut, crush, or chew this medicine. Follow the directions on the prescription label. Take your medicine at regular intervals. Do not take it more often than directed. Do not stop taking except on your doctor's advice. Talk to your pediatrician regarding the use of this medicine in children. Special care may be needed. Overdosage: If you think you have taken too much of this medicine contact a poison control center or emergency room at once. NOTE: This medicine is only for you. Do not share this medicine with others. What if I miss a dose? If you miss a dose, take it as soon as you can. If it is almost time for your next dose, take only that dose. Do not take double or extra doses. What may interact with this medication? amiodarone azithromycin captopril carvedilol certain medications for fungal infections like ketoconazole and itraconazole clarithromycin conivaptan cyclosporine dronedarone eltrombopag erythromycin felodipine grapefruit juice lopinavir quercetin quinidine ranolazine ritonavir ticagrelor verapamil This list may not describe all possible interactions. Give your health care provider a list of all the medicines, herbs, non-prescription drugs, or dietary supplements you use. Also tell them if you smoke, drink alcohol, or use illegal drugs. Some items may interact with your medicine. What should I watch for while using this  medication? This drug may make you feel generally unwell. This is not uncommon, as chemotherapy can affect healthy cells as well as cancer cells. Report any side effects. Continue your course of treatment even though you feel ill unless your doctor tells you to stop. Call your doctor or health care professional for advice if you get a fever, chills or sore throat, or other symptoms of a cold or flu. Do  not treat yourself. This drug decreases your body's ability to fight infections. Try to avoid being around people who are sick. This medicine may increase your risk to bruise or bleed. Call your doctor or health care professional if you notice any unusual bleeding. Be careful brushing and flossing your teeth or using a toothpick because you may get an infection or bleed more easily. If you have any dental work done, tell your dentist you are receiving this medicine. Avoid taking products that contain aspirin, acetaminophen, ibuprofen, naproxen, or ketoprofen unless instructed by your doctor. These medicines may hide a fever. Do not become pregnant while taking this medicine or for 6 months after stopping it. Women should inform their doctor if they wish to become pregnant or think they might be pregnant. Men should not father a child while taking this medicine and for 3 months after stopping it. There is a potential for serious side effects to an unborn child. Talk to your health care professional or pharmacist for more information. Do not breast-feed an infant while taking this medicine. In females, this medicine may make it more difficult to get pregnant. This medicine has also caused reduced sperm counts in some men. This may interfere with the ability to father a child. You should talk to your doctor or health care professional if you are concerned about your fertility. What side effects may I notice from receiving this medication? Side effects that you should report to your doctor or health care professional as soon as possible: allergic reactions like skin rash, itching or hives, swelling of the face, lips, or tongue breathing difficulties diarrhea signs of decreased platelets or bleeding - bruising, pinpoint red spots on the skin, black, tarry stools, blood in the urine signs of decreased red blood cells - unusually weak or tired, feeling faint or lightheaded, falls signs and symptoms of  infection like fever or chills; cough; sore throat; pain or trouble passing urine Side effects that usually do not require medical attention (report to your doctor or health care professional if they continue or are bothersome): hair loss headache loss of appetite nausea, vomiting stomach pain This list may not describe all possible side effects. Call your doctor for medical advice about side effects. You may report side effects to FDA at 1-800-FDA-1088. Where should I keep my medication? Keep out of the reach of children. Store in the refrigerator between 2 and 8 degrees C (36 and 46 degrees F). Keep this medicine in the original container. Protect from light. Throw away any unused medicine after the expiration date. NOTE: This sheet is a summary. It may not cover all possible information. If you have questions about this medicine, talk to your doctor, pharmacist, or health care provider.  2023 [employer]/Gold Standard (2017-09-07 00:00:00)   Doxorubicin Liposomal injection What is this medication? LIPOSOMAL DOXORUBICIN (LIP oh som al  dox oh ROO bi sin) is a chemotherapy drug. This medicine is used to treat many kinds of cancer like Kaposi's sarcoma, multiple myeloma, and ovarian cancer. This medicine may be used for other purposes; ask  your health care provider or pharmacist if you have questions. COMMON BRAND NAME(S): Doxil, Lipodox What should I tell my care team before I take this medication? They need to know if you have any of these conditions: blood disorders heart disease infection (especially a virus infection such as chickenpox, cold sores, or herpes) liver disease recent or ongoing radiation therapy an unusual or allergic reaction to doxorubicin, other chemotherapy agents, soybeans, other medicines, foods, dyes, or preservatives pregnant or trying to get pregnant breast-feeding How should I use this medication? This drug is given as an infusion into a vein. It is  administered in a hospital or clinic by a specially trained health care professional. If you have pain, swelling, burning or any unusual feeling around the site of your injection, tell your health care professional right away. Talk to your pediatrician regarding the use of this medicine in children. Special care may be needed. Overdosage: If you think you have taken too much of this medicine contact a poison control center or emergency room at once. NOTE: This medicine is only for you. Do not share this medicine with others. What if I miss a dose? It is important not to miss your dose. Call your doctor or health care professional if you are unable to keep an appointment. What may interact with this medication? Do not take this medicine with any of the following medications: zidovudine This medicine may also interact with the following medications: medicines to increase blood counts like filgrastim, pegfilgrastim, sargramostim vaccines Talk to your doctor or health care professional before taking any of these medicines: acetaminophen aspirin ibuprofen ketoprofen naproxen This list may not describe all possible interactions. Give your health care provider a list of all the medicines, herbs, non-prescription drugs, or dietary supplements you use. Also tell them if you smoke, drink alcohol, or use illegal drugs. Some items may interact with your medicine. What should I watch for while using this medication? Your condition will be monitored carefully while you are receiving this medicine. You may need blood work done while you are taking this medicine. This drug may make you feel generally unwell. This is not uncommon, as chemotherapy can affect healthy cells as well as cancer cells. Report any side effects. Continue your course of treatment even though you feel ill unless your doctor tells you to stop. Your urine may turn orange-red for a few days after your dose. This is not blood. If your urine  is dark or brown, call your doctor. In some cases, you may be given additional medicines to help with side effects. Follow all directions for their use. Talk to your doctor about your risk of cancer. You may be more at risk for certain types of cancers if you take this medicine. Do not become pregnant while taking this medicine or for 6 months after stopping it. Women should inform their healthcare professional if they wish to become pregnant or think they may be pregnant. Men should not father a child while taking this medicine and for 6 months after stopping it. There is a potential for serious side effects to an unborn child. Talk to your health care professional or pharmacist for more information. Do not breast-feed an infant while taking this medicine. This medicine has caused ovarian failure in some women. This medicine may make it more difficult to get pregnant. Talk to your healthcare professional if you are concerned about your fertility. This medicine has caused decreased sperm counts in some men. This may make it  more difficult to father a child. Talk to your healthcare professional if you are concerned about your fertility. This medicine may cause a decrease in Co-Enzyme Q-10. You should make sure that you get enough Co-Enzyme Q-10 while you are taking this medicine. Discuss the foods you eat and the vitamins you take with your health care professional. What side effects may I notice from receiving this medication? Side effects that you should report to your doctor or health care professional as soon as possible: allergic reactions like skin rash, itching or hives, swelling of the face, lips, or tongue low blood counts - this medicine may decrease the number of white blood cells, red blood cells and platelets. You may be at increased risk for infections and bleeding. signs of hand-foot syndrome - tingling or burning, redness, flaking, swelling, small blisters, or small sores on the palms of  your hands or the soles of your feet signs of infection - fever or chills, cough, sore throat, pain or difficulty passing urine signs of decreased platelets or bleeding - bruising, pinpoint red spots on the skin, black, tarry stools, blood in the urine signs of decreased red blood cells - unusually weak or tired, fainting spells, lightheadedness back pain, chills, facial flushing, fever, headache, tightness in the chest or throat during the infusion breathing problems chest pain fast, irregular heartbeat mouth pain, redness, sores pain, swelling, redness at site where injected pain, tingling, numbness in the hands or feet swelling of ankles, feet, or hands vomiting Side effects that usually do not require medical attention (report to your doctor or health care professional if they continue or are bothersome): diarrhea hair loss loss of appetite nail discoloration or damage nausea red or watery eyes red colored urine stomach upset This list may not describe all possible side effects. Call your doctor for medical advice about side effects. You may report side effects to FDA at 1-800-FDA-1088. Where should I keep my medication? This drug is given in a hospital or clinic and will not be stored at home. NOTE: This sheet is a summary. It may not cover all possible information. If you have questions about this medicine, talk to your doctor, pharmacist, or health care provider.  2023 [employer]/Gold Standard (2018-07-28 00:00:00)   Docetaxel injection What is this medication? DOCETAXEL (doe se TAX el) is a chemotherapy drug. It targets fast dividing cells, like cancer cells, and causes these cells to die. This medicine is used to treat many types of cancers like breast cancer, certain stomach cancers, head and neck cancer, lung cancer, and prostate cancer. This medicine may be used for other purposes; ask your health care provider or pharmacist if you have questions. COMMON BRAND NAME(S):  Docefrez, Taxotere What should I tell my care team before I take this medication? They need to know if you have any of these conditions: infection (especially a virus infection such as chickenpox, cold sores, or herpes) liver disease low blood counts, like low white cell, platelet, or red cell counts an unusual or allergic reaction to docetaxel, polysorbate 80, other chemotherapy agents, other medicines, foods, dyes, or preservatives pregnant or trying to get pregnant breast-feeding How should I use this medication? This drug is given as an infusion into a vein. It is administered in a hospital or clinic by a specially trained health care professional. Talk to your pediatrician regarding the use of this medicine in children. Special care may be needed. Overdosage: If you think you have taken too much of this medicine contact  a poison control center or emergency room at once. NOTE: This medicine is only for you. Do not share this medicine with others. What if I miss a dose? It is important not to miss your dose. Call your doctor or health care professional if you are unable to keep an appointment. What may interact with this medication? Do not take this medicine with any of the following medications: live virus vaccines This medicine may also interact with the following medications: aprepitant certain antibiotics like erythromycin or clarithromycin certain antivirals for HIV or hepatitis certain medicines for fungal infections like fluconazole, itraconazole, ketoconazole, posaconazole, or voriconazole cimetidine ciprofloxacin conivaptan cyclosporine dronedarone fluvoxamine grapefruit juice imatinib verapamil This list may not describe all possible interactions. Give your health care provider a list of all the medicines, herbs, non-prescription drugs, or dietary supplements you use. Also tell them if you smoke, drink alcohol, or use illegal drugs. Some items may interact with your  medicine. What should I watch for while using this medication? Your condition will be monitored carefully while you are receiving this medicine. You will need important blood work done while you are taking this medicine. Call your doctor or health care professional for advice if you get a fever, chills or sore throat, or other symptoms of a cold or flu. Do not treat yourself. This drug decreases your body's ability to fight infections. Try to avoid being around people who are sick. Some products may contain alcohol. Ask your health care professional if this medicine contains alcohol. Be sure to tell all health care professionals you are taking this medicine. Certain medicines, like metronidazole and disulfiram, can cause an unpleasant reaction when taken with alcohol. The reaction includes flushing, headache, nausea, vomiting, sweating, and increased thirst. The reaction can last from 30 minutes to several hours. You may get drowsy or dizzy. Do not drive, use machinery, or do anything that needs mental alertness until you know how this medicine affects you. Do not stand or sit up quickly, especially if you are an older patient. This reduces the risk of dizzy or fainting spells. Alcohol may interfere with the effect of this medicine. Talk to your health care professional about your risk of cancer. You may be more at risk for certain types of cancer if you take this medicine. Do not become pregnant while taking this medicine or for 6 months after stopping it. Women should inform their doctor if they wish to become pregnant or think they might be pregnant. There is a potential for serious side effects to an unborn child. Talk to your health care professional or pharmacist for more information. Do not breast-feed an infant while taking this medicine or for 1 week after stopping it. Males who get this medicine must use a condom during sex with females who can get pregnant. If you get a woman pregnant, the baby  could have birth defects. The baby could die before they are born. You will need to continue wearing a condom for 3 months after stopping the medicine. Tell your health care provider right away if your partner becomes pregnant while you are taking this medicine. This may interfere with the ability to father a child. You should talk to your doctor or health care professional if you are concerned about your fertility. What side effects may I notice from receiving this medication? Side effects that you should report to your doctor or health care professional as soon as possible: allergic reactions like skin rash, itching or hives, swelling of  the face, lips, or tongue blurred vision breathing problems changes in vision low blood counts - This drug may decrease the number of white blood cells, red blood cells and platelets. You may be at increased risk for infections and bleeding. nausea and vomiting pain, redness or irritation at site where injected pain, tingling, numbness in the hands or feet redness, blistering, peeling, or loosening of the skin, including inside the mouth signs of decreased platelets or bleeding - bruising, pinpoint red spots on the skin, black, tarry stools, nosebleeds signs of decreased red blood cells - unusually weak or tired, fainting spells, lightheadedness signs of infection - fever or chills, cough, sore throat, pain or difficulty passing urine swelling of the ankle, feet, hands Side effects that usually do not require medical attention (report to your doctor or health care professional if they continue or are bothersome): constipation diarrhea fingernail or toenail changes hair loss loss of appetite mouth sores muscle pain This list may not describe all possible side effects. Call your doctor for medical advice about side effects. You may report side effects to FDA at 1-800-FDA-1088. Where should I keep my medication? This drug is given in a hospital or clinic  and will not be stored at home. NOTE: This sheet is a summary. It may not cover all possible information. If you have questions about this medicine, talk to your doctor, pharmacist, or health care provider.  2023 [employer] Standard (2021-10-18 00:00:00)

## 2022-06-16 NOTE — Progress Notes (Signed)
AWV and FOLLOW UP  Assessment:    Orders Placed This Encounter  Procedures   CBC with Differential/Platelet   COMPLETE METABOLIC PANEL WITH GFR   Hemoglobin A1c   Magnesium   1. Encounter for Medicare annual wellness exam Due annually Health Maintenance reviewed  2. Essential hypertension Discussed DASH (Dietary Approaches to Stop Hypertension) DASH diet is lower in sodium than a typical American diet. Cut back on foods that are high in saturated fat, cholesterol, and trans fats. Eat more whole-grain foods, fish, poultry, and nuts Remain active and exercise as tolerated daily.  Monitor BP at home-Call if greater than 130/80.    3. Aortic atherosclerosis ([place]) by Abd CT scan on 05/10/2021 Discussed lifestyle modifications. Recommended diet heavy in fruits and veggies, omega 3's. Decrease consumption of animal meats, cheeses, and dairy products. Remain active and exercise as tolerated. Continue to monitor.   4. Nodule of lower lobe of right lung Last f/u with Hematology Dr. [name] 06/13/22 with progressive ovarian cancer. Last CT completed 04/23/2020 Impression:  There are multiple small subpleural nodules at the right lung base overlying the diaphragm, measuring up to 7 mm. These are generally nonspecific and less favored to represent pulmonary metastatic disease given distribution. Attention on follow-up.  5. Poor dentition Continue to follow with Dentist.  6. CKD stage 3 due to type 2 diabetes mellitus ([place]) Discussed how what you eat and drink can aide in kidney protection. Stay well hydrated. Avoid high salt foods. Avoid NSAIDS. Keep BP and BG well controlled.   Take medications as prescribed. Remain active and exercise as tolerated daily. Maintain weight.  Continue to monitor. Education: Reviewed 'ABCs' of diabetes management  A1C (<7):  Goal Met. Blood pressure (<130/80):  Goal Met Cholesterol (LDL <70):  Goal Met Continue Eye Exam yearly  Continue  Dental Exam Q6 mo Discussed dietary recommendations Discussed Physical Activity recommendations Foot exam UTD   7. Malignant neoplasm of ovary, unspecified laterality [facility]) Last f/u with Hematology Dr. [name] 06/13/22 Disease progression was reviewed in length She is not too symptomatic. Moving forward, supportive of disease progression Dr. [name] is reluctant to recommend her to continue on current regimen. She has pr-existing severe peripheral neuropathy and risks of moving forward could exacerbate this as well as cardiomyopathy for any exposure to Adriamycin.  With topotecan, there is a risk for severe pancytopenia which increase r/f infection and reduced QOL. Recommendation to consider second opinion at [facility]. She plans to reach out to Dr. Berline Lopes or Dr. Denman George   8. Hyperlipidemia associated with type 2 diabetes mellitus ([place]) Discussed lifestyle modifications. Recommended diet heavy in fruits and veggies, omega 3's. Decrease consumption of animal meats, cheeses, and dairy products. Remain active and exercise as tolerated. Continue to monitor.   9. Mild nonproliferative diabetic retinopathy of both eyes without macular edema associated with type 2 diabetes mellitus ([place]) Continue to follow with recommended eye exams Continue to monitor   10. Chemotherapy-induced peripheral neuropathy (H[facility]) Continue current medications as directed. Continue to monitor   11. Osteopenia, unspecified location Last DEXA 10/2021. Recommend f/u 2024. Increase exercise, high calcium diet, continue vitamin D suppement   12. Adhesive capsulitis of right shoulder RICE method when flared. Pain  management PRN as directed. Continue to monitor   13. Pancytopenia, acquired ([place]) Hem/onc following closely; Check CBC   14. Vitamin D deficiency Continue supplement. Continue to monitor   15. Acute idiopathic gout, unspecified site No recent flare. Discussed avoiding high purine diet. Continue  to monitor  16. History of left breast cancer Continue to follow with Hem/Onc.  17. Medication management All medications discussed and reviewed in full. All questions and concerns regarding medications addressed.      Over 30 minutes of exam, counseling, chart review, and critical decision making was performed Future Appointments  Date Time Provider [place]  09/15/2022  9:30 AM [name], MD [facility] None  02/25/2023 10:00 AM Darrol Jump, NP [facility] None  05/29/2023 11:00 AM Darrol Jump, NP [facility] None    Plan:   During the course of the visit the patient was educated and counseled about appropriate screening and preventive services including:   Pneumococcal vaccine  Influenza vaccine Td vaccine Prevnar 13 Screening electrocardiogram Screening mammography Bone densitometry screening Colorectal cancer screening Diabetes screening Glaucoma screening Nutrition counseling  Advanced directives: given info/requested copies    Subjective:   Kayla Baker is a 86 y.o. female who presents for AWV and follow up. She has Essential hypertension; Hyperlipidemia associated with type 2 diabetes mellitus ([place]); T2_NIDDM w/CKD3 ([place]); Vitamin D deficiency; Medication management; Gout; History of left breast cancer; Chemotherapy-induced peripheral neuropathy ([place]); Port catheter in place; CKD stage 3 due to type 2 diabetes mellitus ([place]); Edema of right lower leg; Ovarian cancer ([place]); Goals of care, counseling/discussion; Anemia in neoplastic disease; Pancytopenia, acquired ([place]); Cancer associated pain; Genetic testing; Mild nonproliferative diabetic retinopathy of both eyes without macular edema associated with type 2 diabetes mellitus ([place]); Osteopenia; Aortic atherosclerosis ([place]) by Abd CT scan on 05/10/2021; Nodule of lower lobe of right lung; Poor dentition; Adhesive capsulitis of right shoulder; Postoperative breast asymmetry; Hypomagnesemia; and  Diarrhea on their problem list.  She is married, no children. 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 She enjoys spending time with family.   In 2021 she was unfortunately diagnosed with large R sided ovarian tumor with consequent RLE edema, urinary incontinence (wearing depends, improved), had port cath placed and started chemotherapy managed by Dr. Heath Lark, s/p resction by Dr[facility] Everitt Amber [facility] on 10/18/2020.  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  She spoke with Dr. [name] today and plans to reach out to Dr. Berline Lopes or Dr. Denman George to move forward with other treatment options.  She does currently have options provided to her by Dr. [name] including Bevacizumab injection, Topotecan capsules, Doxorubicin Liposomal injection, or Docetaxel injection. She is currently trying to understand these medications along with the SE. Patient states she is not ready to give up. Overall she reports she feels well and in good spirits.   Other significant current problems include hx/o Triple (+) Lt Breast Ca w/ Partial Lt Mastectomy  in April 2017 followed by Chemo tx 5-07/2016 then Radiation in Oct; she completed 4 years of anastrozole, was d/c'd due to ovarian cancer.   [name]   BMI is Body mass index is 24.8 kg/m., she has been working on diet, pushing fruits/vegetables, watching white carbs; she does have [employer] membership but hasn't been going since the pandemic. She is very active with housework and yardwork.  Wt Readings from Last 3 Encounters:  06/16/22 144 lb 8 oz (65.5 kg)  06/13/22 142 lb 12.8 oz (64.8 kg)  05/12/22 143 lb 6.4 oz (65 kg)   Her blood pressure has been controlled at home, today their BP is BP: 130/70 She does not workout. She denies chest pain, shortness of breath, dizziness.   She has aortic atherosclerosis per  CT 05/2021.   She is on cholesterol medication (on  zetia, hx of LFT elevation with statin, was on rosuvastatin 5 mg three days a week but holding while on chemotherapy per oncology) and denies myalgias. Her cholesterol at goal. The cholesterol last visit was:   Lab Results  Component Value Date   CHOL 117 02/21/2022   HDL 45 (L) 02/21/2022   LDLCALC 53 02/21/2022   TRIG 102 02/21/2022   CHOLHDL 2.6 02/21/2022   She has been working on diet and exercise for T2 diabetes (off of metformin due to CKD, glimepiride 1 mg PRN AM if fasting 150+), and denies foot ulcerations, hyperglycemia, hypoglycemia , increased appetite, nausea, polydipsia, polyuria, visual disturbances, vomiting and weight loss.She reports that her blood sugars have been running 91-121. She does have some mild neuropathy in her feet, on gabapentin.    Last A1C in the office was:  Lab Results  Component Value Date   HGBA1C 6.5 (H) 02/21/2022   CKD III associated with T2DM; followed by Dr. Hollie Salk; last GFR:  Lab Results  Component Value Date   GFRNONAA 43 (L) 05/21/2022   GFRNONAA 45 (L) 05/12/2022   GFRNONAA 38 (L) 04/07/2022   Patient is on Vitamin D supplement, taking 5000 IU daily. Lab Results  Component Value Date   VD25OH 22 02/21/2022     Patient is on allopurinol for gout and does not report a recent flare.  Lab Results  Component Value Date   LABURIC 4.4 02/21/2022   Hem/onc and nephrology also following chronic anemia closely; had normal iron 10/2020, B12 05/2020.     Latest Ref Rng & Units 05/21/2022    9:06 AM 05/12/2022    9:08 AM 04/07/2022    9:06 AM  CBC  WBC 4.0 - 10.5 K/uL 4.5  4.1  2.8   Hemoglobin 12.0 - 15.0 g/dL 9.9  9.6  9.1   Hematocrit 36.0 - 46.0 % 29.6  28.8  28.3   Platelets 150 - 400 K/uL 90  84  85      Medication Review  Current Outpatient Medications (Endocrine & Metabolic):    glimepiride (AMARYL) 1 MG tablet, Take 1 tab by mouth in the morning if fasting is running 150+. Please do not take this medication if you are ill or not  eating as this can cause a low blood sugar. (Patient taking differently: Take 1 mg by mouth See admin instructions. Take 1 tab by mouth daily as needed if fasting is running 150+. Please do not take this medication if you are ill or not eating as this can cause a low blood sugar.)  Current Outpatient Medications (Cardiovascular):    ezetimibe (ZETIA) 10 MG tablet, TAKE 1 TABLET BY MOUTH DAILY FOR CHOLESTEROL   hydrochlorothiazide (HYDRODIURIL) 25 MG tablet, Take 1 tablet Daily for BP & Fluid Retention                                        /                          TAKE                           BY                   MOUTH  metoprolol succinate (TOPROL-XL) 25 MG 24 hr tablet, Take  1 tablet  Daily  for BP       /TAKE 1 TABLET BY MOUTH DAILY FOR BLOOD PRESSURE (Patient taking differently: Take 25 mg by mouth daily.)   rosuvastatin (CRESTOR) 5 MG tablet, TAKE 1 TABLET BY MOUTH IN THE EVENING THREE TIMES WEEKLY FOR CHOLESTEROL  Current Outpatient Medications (Respiratory):    loratadine (CLARITIN) 10 MG tablet, Take 10 mg by mouth daily as needed for allergies.  Current Outpatient Medications (Analgesics):    acetaminophen (TYLENOL) 325 MG tablet, Take 325 mg by mouth every 6 (six) hours as needed for headache.   allopurinol (ZYLOPRIM) 100 MG tablet, Take 1 tablet (100 mg total) by mouth daily. FOR GOUT PREVENTION,  Current Outpatient Medications (Hematological):    cyanocobalamin 1000 MCG tablet, Take 1,000 mcg by mouth daily.  Current Outpatient Medications (Other):    blood glucose meter kit and supplies KIT, Dispense based on patient and insurance preference. Use to check blood sugar once daily. Dx: E11.22, N18.30   Cholecalciferol (VITAMIN D3) 5000 units CAPS, Take 5,000 Units by mouth daily.   gabapentin (NEURONTIN) 300 MG capsule, Take 1 capsule 4 x  /day for Pain   glucose blood test strip, Use to test blood sugar once daily. Dx: E11.22, N18.30   Lancets MISC, Use to test blood sugar  once daily. Dx: E11.22, N18.30   lidocaine-prilocaine (EMLA) cream, Apply 1 application. topically daily as needed (for port access).   Magnesium 250 MG TABS, Take by mouth daily.   ondansetron (ZOFRAN) 8 MG tablet, Take 1 tablet (8 mg total) by mouth every 8 (eight) hours as needed for refractory nausea / vomiting.   PFIZER-BIONT COVID-19 VAC-TRIS SUSP injection,    polyethylene glycol (MIRALAX) 17 g packet, Take 17 g by mouth 2 (two) times daily. 17 grams in 6 oz of favorite drink twice a day until bowel movement.  LAXITIVE.  Restart if two days since last bowel movement   trolamine salicylate (ASPERCREME) 10 % cream, Apply 1 application topically as needed for muscle pain (Use if you still feel tingles in feet in the morning after taking Gabapentin).   vitamin C (ASCORBIC ACID) 500 MG tablet, Take 500 mg by mouth every evening.   Current Problems (verified) Patient Active Problem List   Diagnosis Date Noted   Diarrhea 05/12/2022   Hypomagnesemia 02/22/2022   Poor dentition 07/26/2021   Nodule of lower lobe of right lung 06/27/2021   Aortic atherosclerosis ([place]) by Abd CT scan on 05/10/2021 06/10/2021   Mild nonproliferative diabetic retinopathy of both eyes without macular edema associated with type 2 diabetes mellitus ([place]) 02/20/2021   Osteopenia 02/20/2021   Genetic testing 09/14/2020   Pancytopenia, acquired ([place]) 05/15/2020   Cancer associated pain 05/15/2020   Anemia in neoplastic disease 05/03/2020   Goals of care, counseling/discussion 04/20/2020   Ovarian cancer ([place]) 04/19/2020   Edema of right lower leg 04/05/2020   CKD stage 3 due to type 2 diabetes mellitus ([place]) 07/05/2018   Adhesive capsulitis of right shoulder 04/27/2018   Postoperative breast asymmetry 04/01/2017   Port catheter in place 09/18/2016   Chemotherapy-induced peripheral neuropathy ([place]) 08/07/2016   History of left breast cancer 02/29/2016   Gout 12/08/2014   Medication management 08/27/2014    Essential hypertension 11/10/2013   Hyperlipidemia associated with type 2 diabetes mellitus ([place]) 11/10/2013   T2_NIDDM w/CKD3 ([place]) 11/10/2013   Vitamin D deficiency 11/10/2013    Screening Tests Immunization History  Administered Date(s) Administered   DTaP 07/01/2004   Influenza Whole 08/15/2013   Influenza, High Dose Seasonal PF 08/28/2014, 09/20/2015, 08/19/2017, 09/01/2018, 07/28/2019   Influenza-Unspecified 08/04/2020, 08/15/2021   PFIZER(Purple Top)SARS-COV-2 Vaccination 12/12/2019, 12/31/2019, 08/03/2020, 06/11/2021   Pneumococcal Conjugate-13 09/20/2015   Pneumococcal Polysaccharide-23 11/01/2009   Td 08/10/2009, 04/11/2020   Zoster, Live 08/02/2011    Preventative care: Last colonoscopy: 2019 will not need another Last mammogram: Completed 03/2022 Last pap smear/pelvic exam: remotely DEXA: 10/16/2021 - Due 2024  Prior vaccinations: TD or Tdap: 03/2020 Influenza: 08/25/2021  Pneumococcal: 2010 Prevnar13: 2016  Shingles/Zostavax: 2012 discussed will get eventually, had first shingrix was advised not to get the second by oncologist  COVID complete 2021  Names of Other Physician/Practitioners you currently use: 1. [facility]- here for primary care 2. Dr. Nicki Reaper, eye doctor, last visit 09/18/2021 - bil mild retinopathy last year, report requested 3. Dr. Covel Sink, dentist, last visit 2022 -  plans to visit soon.  Patient Care Team: [name], MD as PCP - Kinnie Scales, [place] as Referring Physician (Optometry) [name], MD as Consulting Physician (Dermatology) Latanya Maudlin, MD as Consulting Physician (Orthopedic Surgery) Inda Castle, MD (Inactive) as Consulting Physician (Gastroenterology) Heath Lark, MD as Consulting Physician (Hematology and Oncology) Madelon Lips, MD as Consulting Physician (Nephrology) Carleene Mains, [facility] (Pharmacist)  Allergies Allergies  Allergen Reactions   Keflex [Cephalexin]  Swelling    Tongue and throat swells   Meloxicam Swelling   Prednisone Swelling and Other (See Comments)    Can Not take High doses Cannot tolerate high doses Can Not take High doses   Premarin [Estrogens Conjugated] Hives   Trovan [Alatrofloxacin] Other (See Comments)    Unknown reaction    SURGICAL HISTORY She  has a past surgical history that includes Cataract extraction, bilateral (2014); Abdominal hysterectomy (1973); Incontinence surgery (2006); Knee arthroscopy (Right); Hand surgery (Left, 2012); Colonoscopy; Radioactive seed guided mastectomy with axillary sentinel lymph node biopsy (Left, 03/18/2016); Cardiac catheterization; Portacath placement (N/A, 04/14/2016); Port-a-cath removal (N/A, 11/27/2016); Mastopexy (Right, 06/16/2017); Breast biopsy (Left, 02/26/2016); Breast lumpectomy (Left, 03/18/2016); Reduction mammaplasty (Right, 2018); IR IMAGING GUIDED PORT INSERTION (04/24/2020); IR Paracentesis (05/08/2020); IR Paracentesis (05/16/2020); IR Paracentesis (05/25/2020); and Hip Arthroplasty (Right, 11/08/2021).   FAMILY HISTORY Her family history includes Alzheimer's disease in her father; Breast cancer (age of onset: 2) in her sister; Cirrhosis in her sister; Diabetes in her father; Epilepsy in some other family members; Esophageal cancer (age of onset: 85) in her maternal aunt; Heart Problems in her maternal grandmother; Heart attack (age of onset: 53) in her maternal uncle; Hypertension in her sister; Lung cancer in an other family member; Lung cancer (age of onset: 52) in her sister; Other in her mother and sister; Other (age of onset: 64) in an other family member; Pancreatic cancer (age of onset: 44) in her cousin; Parkinson's disease in her maternal aunt; Stroke in her maternal grandmother; Stroke (age of onset: 37) in her mother.   SOCIAL HISTORY She  reports that she has never smoked. She has never used smokeless tobacco. She reports that she does not drink alcohol and does not use  drugs.  MEDICARE WELLNESS OBJECTIVES: Physical activity: Exercise limited by: neurologic condition(s);orthopedic condition(s) Cardiac risk factors: Cardiac Risk Factors include: advanced age (>79mn, >>67women);diabetes mellitus;hypertension Depression/mood screen:      02/21/2022   10:12 AM  Depression screen PHQ 2/9  Decreased Interest 0  Down, Depressed, Hopeless 0  PHQ - 2  Score 0    ADLs:     06/16/2022   10:42 AM 11/08/2021    2:00 AM  In your present state of health, do you have any difficulty performing the following activities:  Hearing? 0 0  Vision? 0 0  Difficulty concentrating or making decisions? 0 0  Walking or climbing stairs? 0 1  Dressing or bathing? 0 1  Doing errands, shopping? 0 1  Preparing Food and eating ? N   Using the Toilet? N   In the past six months, have you accidently leaked urine? N   Do you have problems with loss of bowel control? N   Managing your Medications? N   Managing your Finances? N   Housekeeping or managing your Housekeeping? N      Cognitive Testing  Alert? Yes  Normal Appearance?Yes  Oriented to person? Yes  Place? Yes   Time? Yes  Recall of three objects?  Yes  Can perform simple calculations? Yes  Displays appropriate judgment?Yes  Can read the correct time from a watch face?Yes  EOL planning: Does Patient Have a Medical Advance Directive?: No Does patient want to make changes to medical advance directive?: No - Patient declined Would patient like information on creating a medical advance directive?: No - Patient declined     Review of Systems  Constitutional:  Negative for malaise/fatigue and weight loss.  HENT:  Negative for hearing loss and tinnitus.   Eyes:  Negative for blurred vision and double vision.  Respiratory:  Negative for cough, sputum production, shortness of breath and wheezing.   Cardiovascular:  Negative for chest pain, palpitations, orthopnea, claudication, leg swelling and PND.  Gastrointestinal:   Negative for abdominal pain, blood in stool, constipation, diarrhea, heartburn, melena, nausea and vomiting.  Genitourinary: Negative.   Musculoskeletal:  Negative for falls, joint pain and myalgias.  Skin:  Negative for rash.  Neurological:  Positive for sensory change (bil feet/ankles, chronic following chemo). Negative for dizziness, tingling, weakness and headaches.  Endo/Heme/Allergies:  Negative for polydipsia.  Psychiatric/Behavioral: Negative.  Negative for depression, memory loss, substance abuse and suicidal ideas. The patient is not nervous/anxious and does not have insomnia.   All other systems reviewed and are negative.    Objective:   Today's Vitals   06/16/22 0953  BP: 130/70  Pulse: 82  Temp: (!) 96.8 F (36 C)  SpO2: 99%  Weight: 144 lb 8 oz (65.5 kg)  Height: 5' 4"  (1.626 m)   Body mass index is 24.8 kg/m.  General appearance: alert, no distress, WD/WN, elder female HEENT: normocephalic, sclerae anicteric, TMs pearly, nares patent, no discharge or erythema, pharynx normal Oral cavity: MMM, no lesions Neck: supple, no lymphadenopathy, no thyromegaly, no masses Heart: RRR, normal S1, S2, no murmurs Lungs: CTA bilaterally, no wheezes, rhonchi, or rales Abdomen: +bs, soft, non tender, non distended, no masses, no hepatomegaly, no splenomegaly Musculoskeletal: nontender, no swelling, no obvious deformity Extremities: LLE without edema, RLE with scant swelling around ankle, no cyanosis, no clubbing Pulses: 2+ symmetric, upper and lower extremities, normal cap refill Neurological: alert, oriented x 3, CN2-12 intact, strength normal upper extremities and lower extremities, sensation normal UE, bil LE diminished through feet and ankles to monofilament and light touch, DTRs 2+ throughout, no cerebellar signs, gait slow steady Psychiatric: normal affect, behavior normal, pleasant  Gyn: defer to GYN/onc   Medicare Attestation I have personally reviewed: The patient's  medical and social history Their use of alcohol, tobacco or illicit drugs Their current  medications and supplements The patient's functional ability including ADLs,fall risks, home safety risks, cognitive, and hearing and visual impairment Diet and physical activities Evidence for depression or mood disorders  The patient's weight, height, BMI, and visual acuity have been recorded in the chart.  I have made referrals, counseling, and provided education to the patient based on review of the above and I have provided the patient with a written personalized care plan for preventive services.     Darrol Jump, NP   06/16/2022

## 2022-06-16 NOTE — Telephone Encounter (Signed)
Patient called -LVM: At appt last week with Dr. [name], she was given information on having another round of treatment. Before beginning that, she would like to talk with another MD and get a second opinion. Patient's MD preference for second opinion is Dr. Berline Lopes, here at [facility], if that is ok. Patient requests to be notified of appt time.  Dr. [name] informed of patient request. She stated she will send request to Dr. Berline Lopes and asked that patient be notified.  Contacted patient - [name] answered and stated spouse away from home. Asked him to inform his wife that scheduling will contact her to schedule appt with Dr. Berline Lopes as she requested. Advised him to give wife message to call this office for any questions. He verbalized understanding.

## 2022-06-17 ENCOUNTER — Telehealth: Payer: Self-pay | Admitting: *Deleted

## 2022-06-17 ENCOUNTER — Encounter: Payer: Self-pay | Admitting: Hematology and Oncology

## 2022-06-17 LAB — CBC WITH DIFFERENTIAL/PLATELET
Absolute Monocytes: 650 cells/uL (ref 200–950)
Basophils Absolute: 30 cells/uL (ref 0–200)
Basophils Relative: 0.9 %
Eosinophils Absolute: 30 cells/uL (ref 15–500)
Eosinophils Relative: 0.9 %
HCT: 31.6 % — ABNORMAL LOW (ref 35.0–45.0)
Hemoglobin: 10 g/dL — ABNORMAL LOW (ref 11.7–15.5)
Lymphs Abs: 1129 cells/uL (ref 850–3900)
MCH: 32.8 pg (ref 27.0–33.0)
MCHC: 31.6 g/dL — ABNORMAL LOW (ref 32.0–36.0)
MCV: 103.6 fL — ABNORMAL HIGH (ref 80.0–100.0)
MPV: 11.7 fL (ref 7.5–12.5)
Monocytes Relative: 19.7 %
Neutro Abs: 1462 cells/uL — ABNORMAL LOW (ref 1500–7800)
Neutrophils Relative %: 44.3 %
Platelets: 111 10*3/uL — ABNORMAL LOW (ref 140–400)
RBC: 3.05 10*6/uL — ABNORMAL LOW (ref 3.80–5.10)
RDW: 16.8 % — ABNORMAL HIGH (ref 11.0–15.0)
Total Lymphocyte: 34.2 %
WBC: 3.3 10*3/uL — ABNORMAL LOW (ref 3.8–10.8)

## 2022-06-17 LAB — COMPLETE METABOLIC PANEL WITH GFR
AG Ratio: 1.1 (calc) (ref 1.0–2.5)
ALT: 19 U/L (ref 6–29)
AST: 41 U/L — ABNORMAL HIGH (ref 10–35)
Albumin: 3.5 g/dL — ABNORMAL LOW (ref 3.6–5.1)
Alkaline phosphatase (APISO): 117 U/L (ref 37–153)
BUN/Creatinine Ratio: 18 (calc) (ref 6–22)
BUN: 21 mg/dL (ref 7–25)
CO2: 31 mmol/L (ref 20–32)
Calcium: 9.5 mg/dL (ref 8.6–10.4)
Chloride: 106 mmol/L (ref 98–110)
Creat: 1.16 mg/dL — ABNORMAL HIGH (ref 0.60–0.95)
Globulin: 3.1 g/dL (calc) (ref 1.9–3.7)
Glucose, Bld: 100 mg/dL — ABNORMAL HIGH (ref 65–99)
Potassium: 4.1 mmol/L (ref 3.5–5.3)
Sodium: 144 mmol/L (ref 135–146)
Total Bilirubin: 0.6 mg/dL (ref 0.2–1.2)
Total Protein: 6.6 g/dL (ref 6.1–8.1)
eGFR: 46 mL/min/{1.73_m2} — ABNORMAL LOW (ref >=60)

## 2022-06-17 LAB — HEMOGLOBIN A1C
Hgb A1c MFr Bld: 6.2 % of total Hgb — ABNORMAL HIGH (ref <5.7)
Mean Plasma Glucose: 131 mg/dL
eAG (mmol/L): 7.3 mmol/L

## 2022-06-17 LAB — MAGNESIUM: Magnesium: 1.4 mg/dL — ABNORMAL LOW (ref 1.5–2.5)

## 2022-06-17 NOTE — Telephone Encounter (Signed)
Per Lenna [facility] APP scheduled the patient to see Dr Berline Lopes on 7/31

## 2022-06-23 ENCOUNTER — Other Ambulatory Visit: Payer: Self-pay

## 2022-06-27 ENCOUNTER — Encounter: Payer: Self-pay | Admitting: Gynecologic Oncology

## 2022-06-30 ENCOUNTER — Inpatient Hospital Stay (HOSPITAL_BASED_OUTPATIENT_CLINIC_OR_DEPARTMENT_OTHER): Payer: PPO | Admitting: Gynecologic Oncology

## 2022-06-30 ENCOUNTER — Encounter: Payer: Self-pay | Admitting: Gynecologic Oncology

## 2022-06-30 ENCOUNTER — Other Ambulatory Visit: Payer: Self-pay

## 2022-06-30 VITALS — BP 121/74 | HR 88 | Temp 98.3°F | Resp 16 | Wt 142.2 lb

## 2022-06-30 DIAGNOSIS — Z923 Personal history of irradiation: Secondary | ICD-10-CM | POA: Diagnosis not present

## 2022-06-30 DIAGNOSIS — C786 Secondary malignant neoplasm of retroperitoneum and peritoneum: Secondary | ICD-10-CM

## 2022-06-30 DIAGNOSIS — Z9221 Personal history of antineoplastic chemotherapy: Secondary | ICD-10-CM | POA: Diagnosis not present

## 2022-06-30 DIAGNOSIS — Z853 Personal history of malignant neoplasm of breast: Secondary | ICD-10-CM

## 2022-06-30 DIAGNOSIS — C561 Malignant neoplasm of right ovary: Secondary | ICD-10-CM

## 2022-06-30 DIAGNOSIS — Z79899 Other long term (current) drug therapy: Secondary | ICD-10-CM

## 2022-06-30 DIAGNOSIS — C569 Malignant neoplasm of unspecified ovary: Secondary | ICD-10-CM

## 2022-06-30 NOTE — Progress Notes (Unsigned)
Gynecologic Oncology Return Clinic Visit  06/30/2022  Reason for Visit: Follow-up in the setting of slowly progressive ovarian cancer  Treatment History: Oncology History Overview Note  HRD positive Progressed and intolerant to olaparib Progressed on carboplatin, taxol and gemzar   History of left breast cancer  02/26/2016 Initial Diagnosis   Left breast biopsy 11:00 position: invasive ductal carcinoma with DCIS, ER 90%, PR 10%, HER-2 negative, Ki-67 30%, grade 2, 2.2 cm palpable lesion T2 N0 stage II a clinical stage   03/18/2016 Surgery   Left lumpectomy: Invasive ductal carcinoma, grade 2, 6.3 cm, with high-grade DCIS, margins negative, 0/4 lymph nodes negative, ER 90%, via 10%, HER-2 negative ratio 0.97, Ki-67 30%, T3 N0 stage IIB   03/25/2016 Procedure   Genetic testing is negative for pathogenic mutations within any of the 20 Genes on the breast/ovarian cancer panel   04/04/2016 Oncotype testing   Oncotype DX recurrence score 37, 25% 10 year distant risk of recurrence   04/17/2016 - 07/31/2016 Chemotherapy   Adjuvant chemotherapy with dose dense Adriamycin and Cytoxan followed by Abraxane weekly 8 ( discontinued due to neuropathy)   09/01/2016 - 09/26/2016 Radiation Therapy   Adj XRT 1) Left breast: 42.5 Gy in 17 fractions. 2) Left breast boost: 7.5 Gy in 3 fractions.   12/02/2016 -  Anti-estrogen oral therapy   Anastrozole 1 mg daily   09/05/2021 - 05/21/2022 Chemotherapy   Patient is on Treatment Plan : OVARIAN RECURRENT 3RD LINE Carboplatin D1 / Gemcitabine D1,8 (4/800) q21d     07/08/2022 -  Chemotherapy   Patient is on Treatment Plan : ovarian Docetaxel q21d     Ovarian cancer ([place])  04/03/2020 Imaging   US pelvis Complex cystic and solid mass in LEFT adnexa 12.5 cm diameter question cystic ovarian neoplasm; recommend correlation with serum tumor markers and further evaluation by MR imaging with and without contrast.     04/04/2020 Imaging   US venous Doppler No evidence of  deep venous thrombosis in the right lower extremity. Left common femoral vein also patent.   04/15/2020 Imaging   MRI pelvis 1. Large complex solid and cystic mass arising from the right adnexa measuring 10.8 by 11.5 by 11.1 cm. This has an aggressive appearance with extensive enhancing mural soft tissue components. Findings are highly suspicious for malignant ovarian neoplasm. 2. Extensive bilateral retroperitoneal and bilateral iliac adenopathy compatible with metastatic disease. 3. Signs of extensive peritoneal carcinomatosis including ascites, enhancement, thickening and nodularity of the peritoneal reflections, omental caking and bulky peritoneal nodularity. 4. Suspected serosal involvement of the dome of bladder with loss of normal fat plane. Cannot rule out mural invasion by tumor.   04/17/2020 Tumor Marker   Patient's tumor was tested for the following markers: CA-125 Results of the tumor marker test revealed 1762.   04/20/2020 Cancer Staging   Staging form: Ovary, Fallopian Tube, and Primary Peritoneal Carcinoma, AJCC 8th Edition - Clinical: FIGO Stage IIIC (cT3, cN1, cM0) - Signed by Heath Lark, MD on 04/20/2020   04/23/2020 Imaging   1. Redemonstrated dominant mixed solid and cystic mass arising from the vicinity of the right ovary measuring at least 11.5 x 10.7 cm, not significantly changed compared to prior MR, and consistent with primary ovarian malignancy.  2. Numerous bulky retroperitoneal, bilateral iliac, and pelvic sidewall lymph nodes. 3. Moderate volume ascites throughout the abdomen and pelvis with subtle thickening and nodularity throughout the peritoneum, and extensive bulky nodular metastatic disease of the omentum. 4. Constellation of findings is consistent  with advanced nodal and peritoneal metastatic disease. 5. There are prominent subcentimeter epicardial lymph nodes, nonspecific although suspicious for nodal metastatic disease. No definite nodal metastatic disease in  the chest. Attention on follow-up. 6. There are multiple small subpleural nodules at the right lung base overlying the diaphragm, measuring up to 7 mm. These are generally nonspecific and less favored to represent pulmonary metastatic disease given distribution. Attention on follow-up. 7. Somewhat coarse contour of the liver, suggestive of cirrhosis, although out without overt morphologic stigmata. 8. Aortic Atherosclerosis (ICD10-I70.0).     04/24/2020 Procedure   Successful placement of a right internal jugular approach power injectable Port-A-Cath. The catheter is ready for immediate use.     05/02/2020 Tumor Marker   Patient's tumor was tested for the following markers: CA-125 Results of the tumor marker test revealed 1921   05/03/2020 - 12/12/2020 Chemotherapy   The patient had carboplatin and taxol for chemotherapy treatment.     05/08/2020 Procedure   Successful ultrasound-guided paracentesis yielding 2.6 liters of peritoneal fluid.   05/16/2020 Procedure   Successful ultrasound-guided paracentesis yielding 3.2 liters of peritoneal fluid   05/25/2020 Procedure   Successful ultrasound-guided paracentesis yielding 3 liters of peritoneal fluid.     06/01/2020 Procedure   Successful ultrasound-guided therapeutic paracentesis yielding 1.7 liters of peritoneal fluid.   06/14/2020 Tumor Marker   Patient's tumor was tested for the following markers: CA-125 Results of the tumor marker test revealed 1309   06/20/2020 Imaging   1. Dominant mixed cystic and solid mass in the central pelvis is stable in the interval. 2. Clear interval decrease in retroperitoneal and pelvic sidewall lymphadenopathy. The bulky omental disease has also clearly decreased in the interval. 3. Interval decrease in ascites. 4. Stable 8 mm subpleural nodule along the diaphragm. 5. Aortic Atherosclerosis (ICD10-I70.0).   07/10/2020 Tumor Marker   Patient's tumor was tested for the following markers: CA-125 Results of  the tumor marker test revealed 220   08/03/2020 Tumor Marker   Patient's tumor was tested for the following markers: CA-125 Results of the tumor marker test revealed 53.3.   09/03/2020 Tumor Marker   Patient's tumor was tested for the following markers: CA-125 Results of the tumor marker test revealed 29.7   09/20/2020 Imaging   1. Substantial improvement. The right ovarian cystic and solid mass is half the volume that it measured on 06/20/2020. Similar reduction in the bulk of the omental caking of tumor. Prior ascites is resolved and the retroperitoneal adenopathy is markedly improved. 2. Other imaging findings of potential clinical significance: Stable pleural-based nodularity along the right hemidiaphragm measuring about 1.1 by 0.7 by 0.3 cm. Stable hypodense lesion of the right kidney upper pole, probably a cyst although the configuration of this lesion makes it difficult to obtain accurate density measurements. Left ovarian cyst, stable. Chondrocalcinosis involving the acetabular labra. Mild lumbar spondylosis and degenerative disc disease. 3. Aortic atherosclerosis.     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 (stage ypT3c), predominantly viable with focal fibrosis and hemosiderin laden macrophages (possible mild treatment effect)  B: Ovary and fallopian tube, right, salpingo-oophorectomy - High grade serous carcinoma involving right ovary and fallopian tube with surface involvement, size up to 8 cm  - Areas of necrosis and hemosiderin laden macrophages suggestive of treatment effect - 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 - See synoptic report and comment  C: Ovary and fallopian tube, left,  salpingo-oophorectomy - High grade serous carcinoma involving the left fallopian tube, size up to 0.5 cm - Ovary with no definite involvement by carcinoma identified - Nodular area of endometriosis and peritoneal inclusion cyst also present - See synoptic report and comment   Procedure:    Bilateral salpingo-oophorectomy    Procedure:    Omentectomy    Specimen Integrity of Right Ovary:    Intact with surface involvement by tumor    Specimen Integrity of Left Ovary:    Capsule intact   TUMOR Tumor Site:    Right fallopian tube: favor as primary site; also involves right ovary and left fallopian tube  Histologic Type:    Serous carcinoma  Histologic Grade:    High grade  Tumor Size:    Greatest Dimension (Centimeters): in fallopian tube: 1.0 cm; in ovary: 8.0 cm Ovarian Surface Involvement:    Present    Laterality:    Right  Fallopian Tube Surface Involvement:    Present    Laterality:    Bilateral  Other Tissue / Organ Involvement:    Right ovary  Other Tissue / Organ Involvement:    Right fallopian tube  Other Tissue / Organ Involvement:    Left fallopian tube  Other Tissue / Organ Involvement:    Omentum  Largest Extrapelvic Peritoneal Focus:    Macroscopic (greater than 2 cm)  Peritoneal / Ascitic Fluid:    Not submitted / unknown  Pleural Fluid:    Not submitted / unknown  Treatment Effect:    No definite or minimal response identified (chemotherapy response score [CRS] 1)   LYMPH NODES Regional Lymph Nodes:    No lymph nodes submitted or found   PATHOLOGIC STAGE CLASSIFICATION (pTNM, AJCC 8th Edition) TNM Descriptors:    y (post-treatment)  Primary Tumor (pT):    pT3c  Regional Lymph Nodes (pN):    pNX   FIGO STAGE FIGO Stage:    IIIC   ADDITIONAL FINDINGS Additional Findings:    Serous tubal intraepithelial carcinoma (STIC)  Additional Findings:    Left ovary with no definite carcinoma identified, left-sided nodule of endometriosis and peritoneal inclusion cyst     10/18/2020 - 10/20/2020 Hospital Admission   She was admitted to [facility] for interval debulking surgery   11/19/2020 Tumor Marker   Patient's tumor was tested for the following markers: CA-125. Results of the tumor marker test revealed 38.3   12/12/2020 Tumor Marker   Patient's tumor was tested for the following markers CA-125. Results of the tumor marker test revealed 42.3.   01/02/2021 Tumor Marker   Patient's tumor was tested for the following markers: CA-125. Results of the tumor marker test revealed 37.3   01/29/2021 Tumor Marker   Patient's tumor was tested for the following markers: CA-125 Results of the tumor marker test revealed 44.3   01/29/2021 Imaging   1. Interval increase in loculated appearing ascites in the low central abdomen and pelvis. 2. There is extensive peritoneal nodularity surrounding this fluid, the nodularity itself not appreciably changed in appearance compared to prior examination. 3. Additional peritoneal nodularity and/or mesenteric lymph nodes are unchanged. 4. Interval decrease in size of a low-attenuation nodule overlying the right hemidiaphragm, now measuring 1.2 cm, previously 1.8 cm when measured similarly. Findings are consistent with treatment response of a metastatic nodule. No other evidence of intrathoracic metastatic disease. 5. Status post hysterectomy,  oophorectomy, and omentectomy.   01/31/2021 - 08/26/2021 Chemotherapy   She is started on Olaparib       03/04/2021 Tumor Marker   Patient's tumor was tested for the following markers: CA-125 Results of the tumor marker test revealed 39.7   04/01/2021 Tumor Marker   Patient's tumor was tested for the following markers: CA-125 Results of the tumor marker test revealed 32.3   05/10/2021 Imaging   1. No significant change in peritoneal carcinomatosis since previous study of 3 months ago, primarily in the pelvis where there is complex fluid and peritoneal nodularity. 2. No evidence of solid  visceral organ metastasis. No evidence of bowel or ureteral obstruction. 3.  Aortic Atherosclerosis (ICD10-I70.0).   06/13/2021 Tumor Marker   Patient's tumor was tested for the following markers: CA-125. Results of the tumor marker test revealed 48.3.   07/29/2021 Tumor Marker   Patient's tumor was tested for the following markers: CA-125. Results of the tumor marker test revealed 54.   08/25/2021 Imaging   1. There is extensive peritoneal soft tissue thickening and nodularity, particularly about the low right hemipelvis, which is similar to prior examination. Some small peritoneal nodules throughout the abdomen and pelvis are slightly enlarged, other nodules are unchanged. 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 2. No significant change in a subpleural nodule overlying the right hemidiaphragm. 3. Unchanged, loculated small volume pelvic ascites. 4. Status post hysterectomy, oophorectomy, and omentectomy. 5. Coronary artery disease.   Aortic Atherosclerosis (ICD10-I70.0).     08/26/2021 Tumor Marker   Patient's tumor was tested for the following markers: CA-125. Results of the tumor marker test revealed 56.   09/04/2021 Tumor Marker   Patient's tumor was tested for the following markers: CA-125. Results of the tumor marker test revealed 55.4.   09/05/2021 - 05/21/2022 Chemotherapy   Patient is on Treatment Plan : OVARIAN RECURRENT 3RD LINE Carboplatin D1 / Gemcitabine D1,8 (4/800) q21d     10/07/2021 Tumor Marker   Patient's tumor was tested for the following markers: CA-125. Results of the tumor marker test revealed 51.9.   11/05/2021 Tumor Marker   Patient's tumor was tested for the following markers: CA-125. Results of the tumor marker test revealed 45.6.   11/27/2021 Imaging   1. Overall stable to slightly improved ill-defined soft tissue density throughout the pelvis. 2. Slight interval decrease in size of the omental lesions. 3.  Stable free pelvic fluid. 4. New surgical changes from a right hip replacement. 5. New nodular and somewhat triangular density at the left lung base is likely an area of atelectasis. 6. Aortic atherosclerosis.   12/24/2021 Tumor Marker   Patient's tumor was tested for the following markers: CA-125. Results of the tumor marker test revealed 39.6.   02/04/2022 Tumor Marker   Patient's tumor was tested for the following markers: CA-125. Results of the tumor marker test revealed 36.3.   03/05/2022 Imaging   1. Similar to mild interval increase in size of right pericolic goiter nodules and omental nodules. 2. Similar appearance of the amorphous soft tissue within the pelvis. There is decreased fluid component   03/06/2022 Tumor Marker   Patient's tumor was tested for the following markers: CA-125. Results of the tumor marker test revealed 56.2.   04/07/2022 Tumor Marker   Patient's tumor was tested for the following markers: CA-125. Results of the tumor marker test revealed 65.5.   05/13/2022 Tumor Marker   Patient's tumor was tested for the following markers: CA-125. Results  of the tumor marker test revealed 66.5.   06/12/2022 Imaging   1. Slight increase in prominence of the peritoneal and omental tumor deposits, which are most thickly banded in the lower pelvis. 2. Questionable wall thickening in the descending duodenum, cannot exclude duodenitis. 3. Other imaging findings of potential clinical significance: Bibasilar scarring. Aortic Atherosclerosis (ICD10-I70.0). Cholelithiasis.   07/08/2022 -  Chemotherapy   Patient is on Treatment Plan : ovarian Docetaxel q21d      NACT x6 cycles with carboplatin/taxol. IDS on 10/18/20.  Received 3 cycles of adjuvant carbo/taxol. Patient was then started on maintenance Olaparib in March in the seeing of HRD tumor testing. Started carbo/gemzar for recurrence on 09/05/2021, received cycle #9 on 6/21.   Interval History: I saw the patient last in  September in the setting of a slowly increasing CA-125 stable disease to slight progression on most recent CT scan.  Was asymptomatic at that time.  We discussed treatment options and she was interested in pursuing systemic treatment in the setting of platinum sensitive disease.  Today, the patient presents with her husband.  She notes overall feeling good.  She endorses a good appetite without nausea or emesis.  Denies any recent weight changes.  Denies any abdominal or pelvic pain.  Endorses normal bowel bladder function.  She denies any vaginal bleeding or discharge.  Fatigue continues to be at 70-80% of her normal, unchanged.  Past Medical/Surgical History: 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[place]) 02/29/2016   skin- 2016- squamous- on right upper arm   Chronic kidney disease    Closed displaced fracture of right femoral neck ([facility]) 11/07/2021   Deficiency anemia 11/02/2020   Diabetes mellitus without complication ([facility])    GERD (gastroesophageal reflux disease)    Gout    takes Allopurinol daily   History of chemotherapy    History of colon polyps    benign   History of shingles    Hx of radiation therapy    Hyperlipidemia    Hypertension    Ovarian ca ([place]) dx'd 03/2020   Personal history of chemotherapy    2017   Personal history of radiation therapy    2017   Vitamin D deficiency     Past Surgical History:  Procedure Laterality Date   ABDOMINAL HYSTERECTOMY  1973   BREAST BIOPSY Left 02/26/2016   BREAST LUMPECTOMY Left 03/18/2016   CARDIAC CATHETERIZATION     patient denies this procedure   CATARACT EXTRACTION, BILATERAL  2014   right eye 2/14; left eye 3/3   COLONOSCOPY     HAND SURGERY Left 2012   HIP ARTHROPLASTY Right 11/08/2021   Procedure: ARTHROPLASTY BIPOLAR HIP (HEMIARTHROPLASTY) POSTERIOR;  Surgeon: Willaim Sheng, MD;  Location: [facility];  Service: Orthopedics;  Laterality: Right;   INCONTINENCE SURGERY  2006   IR IMAGING GUIDED PORT INSERTION  04/24/2020   IR PARACENTESIS  05/08/2020   IR PARACENTESIS  05/16/2020   IR PARACENTESIS  05/25/2020   KNEE ARTHROSCOPY Right    MASTOPEXY Right 06/16/2017   Procedure: RIGHT BREAST MASTOPEXY;  Surgeon: Irene Limbo, MD;  Location: [facility];  Service: Plastics;  Laterality: Right;   PORT-A-CATH REMOVAL N/A 11/27/2016   Procedure: REMOVAL PORT-A-CATH;  Surgeon: Fanny Skates, MD;  Location: [facility];  Service: General;  Laterality: N/A;  PORTACATH PLACEMENT N/A 04/14/2016   Procedure: INSERTION PORT-A-CATH ;  Surgeon: Fanny Skates, MD;  Location: [facility];  Service: General;  Laterality: N/A;   RADIOACTIVE SEED GUIDED PARTIAL MASTECTOMY WITH AXILLARY SENTINEL LYMPH NODE BIOPSY Left 03/18/2016   Procedure: RADIOACTIVE SEED GUIDED LEFT PARTIAL MASTECTOMY WITH AXILLARY SENTINEL LYMPH NODE BIOPSY AND BLUE DYE INJECTION;  Surgeon: Fanny Skates, MD;  Location: [facility];  Service: General;  Laterality: Left;   REDUCTION MAMMAPLASTY Right 2018    Family History  Problem Relation Age of Onset   Stroke Mother 4   Other Mother        history of hysterectomy after last childbirth   Diabetes Father    Alzheimer's disease Father    Hypertension Sister    Lung cancer Sister 13       smoker   Other Sister        history of hysterectomy for fibroids   Breast cancer Sister 93   Cirrhosis Sister    Stroke Maternal Grandmother    Heart Problems Maternal Grandmother    Esophageal cancer Maternal Aunt 74       not a smoker   Parkinson's disease Maternal Aunt    Heart attack Maternal Uncle 65   Pancreatic cancer Cousin 29       paternal 1st cousin   Lung cancer Other        nephew dx. 34s; +smoker   Epilepsy Other    Other Other 12       great niece dx. benign ganglioglioma brain tumor; treated at [facility]   Epilepsy Other        no seizures in 2  years   Colon cancer Neg Hx    Stomach cancer Neg Hx    Colon polyps Neg Hx    Ulcerative colitis Neg Hx     Social History   Socioeconomic History   Marital status: Married    Spouse name: [name]   Number of children: 0   Years of education: Not on file   Highest education level: Not on file  Occupational History   Not on file  Tobacco Use   Smoking status: Never   Smokeless tobacco: Never  Vaping Use   Vaping Use: Never used  Substance and Sexual Activity   Alcohol use: No   Drug use: No   Sexual activity: Not Currently    Birth control/protection: Surgical, Post-menopausal  Other Topics Concern   Not on file  Social History Narrative   Not on file   Social Determinants of Health   Financial Resource Strain: Not on file  Food Insecurity: Not on file  Transportation Needs: No Transportation Needs (09/26/2021)   PRAPARE - Hydrologist (Medical): No    Lack of Transportation (Non-Medical): No  Physical Activity: Not on file  Stress: Not on file  Social Connections: Not on file    Current Medications:  Current Outpatient Medications:    acetaminophen (TYLENOL) 325 MG tablet, Take 325 mg by mouth every 6 (six) hours as needed for headache., Disp: , Rfl:    allopurinol (ZYLOPRIM) 100 MG tablet, Take 1 tablet (100 mg total) by mouth daily. FOR GOUT PREVENTION,, Disp: 90 tablet, Rfl: 3   amoxicillin (AMOXIL) 500 MG tablet, SMARTSIG:4 Tablet(s) By Mouth, Disp: , Rfl:    blood glucose meter kit and supplies KIT, Dispense based on patient and insurance preference. Use to check blood sugar once daily. Dx: E11.22, N18.30, Disp: 1 each, Rfl:  12   Cholecalciferol (VITAMIN D3) 5000 units CAPS, Take 5,000 Units by mouth daily., Disp: , Rfl:    cyanocobalamin 1000 MCG tablet, Take 1,000 mcg by mouth daily., Disp: , Rfl:    ezetimibe (ZETIA) 10 MG tablet, TAKE 1 TABLET BY MOUTH DAILY FOR CHOLESTEROL, Disp: 90 tablet, Rfl: 3   gabapentin (NEURONTIN) 300  MG capsule, Take 1 capsule 4 x  /day for Pain, Disp: 360 capsule, Rfl: 3   glimepiride (AMARYL) 1 MG tablet, Take 1 tab by mouth in the morning if fasting is running 150+. Please do not take this medication if you are ill or not eating as this can cause a low blood sugar. (Patient taking differently: Take 1 mg by mouth See admin instructions. Take 1 tab by mouth daily as needed if fasting is running 150+. Please do not take this medication if you are ill or not eating as this can cause a low blood sugar.), Disp: 90 tablet, Rfl: 1   glucose blood test strip, Use to test blood sugar once daily. Dx: E11.22, N18.30, Disp: 100 each, Rfl: 12   hydrochlorothiazide (HYDRODIURIL) 25 MG tablet, Take 1 tablet Daily for BP & Fluid Retention                                        /                          TAKE                           BY                   MOUTH, Disp: 90 tablet, Rfl: 3   Lancets MISC, Use to test blood sugar once daily. Dx: E11.22, N18.30, Disp: 200 each, Rfl: 11   lidocaine-prilocaine (EMLA) cream, Apply 1 application. topically daily as needed (for port access)., Disp: 30 g, Rfl: 3   loratadine (CLARITIN) 10 MG tablet, Take 10 mg by mouth daily as needed for allergies., Disp: , Rfl:    Magnesium 250 MG TABS, Take by mouth daily., Disp: , Rfl:    metoprolol succinate (TOPROL-XL) 25 MG 24 hr tablet, Take  1 tablet  Daily  for BP       /TAKE 1 TABLET BY MOUTH DAILY FOR BLOOD PRESSURE (Patient taking differently: Take 25 mg by mouth daily.), Disp: 90 tablet, Rfl: 3   ondansetron (ZOFRAN) 8 MG tablet, Take 1 tablet (8 mg total) by mouth every 8 (eight) hours as needed for refractory nausea / vomiting., Disp: 30 tablet, Rfl: 1   PFIZER-BIONT COVID-19 VAC-TRIS SUSP injection, , Disp: , Rfl:    polyethylene glycol (MIRALAX) 17 g packet, Take 17 g by mouth 2 (two) times daily. 17 grams in 6 oz of favorite drink twice a day until bowel movement.  LAXITIVE.  Restart if two days since last bowel movement,  Disp: 14 packet, Rfl: 0   rosuvastatin (CRESTOR) 5 MG tablet, TAKE 1 TABLET BY MOUTH IN THE EVENING THREE TIMES WEEKLY FOR CHOLESTEROL, Disp: 36 tablet, Rfl: 3   trolamine salicylate (ASPERCREME) 10 % cream, Apply 1 application topically as needed for muscle pain (Use if you still feel tingles in feet in the morning after taking Gabapentin)., Disp: , Rfl:    vitamin C (ASCORBIC  ACID) 500 MG tablet, Take 500 mg by mouth every evening. , Disp: , Rfl:    dexamethasone (DECADRON) 4 MG tablet, Take 2 tablets the morning before chemotherapy and then 2 tablets daily for 2 days after chemo, Disp: 30 tablet, Rfl: 1  Review of Systems: Denies appetite changes, fevers, chills, fatigue, unexplained weight changes. Denies hearing loss, neck lumps or masses, mouth sores, ringing in ears or voice changes. Denies cough or wheezing.  Denies shortness of breath. Denies chest pain or palpitations. Denies leg swelling. Denies abdominal distention, pain, blood in stools, constipation, diarrhea, nausea, vomiting, or early satiety. Denies pain with intercourse, dysuria, frequency, hematuria or incontinence. Denies hot flashes, pelvic pain, vaginal bleeding or vaginal discharge.   Denies joint pain, back pain or muscle pain/cramps. Denies itching, rash, or wounds. Denies dizziness, headaches, numbness or seizures. Denies swollen lymph nodes or glands, denies easy bruising or bleeding. Denies anxiety, depression, confusion, or decreased concentration.  Physical Exam: BP 121/74 (BP Location: Right Arm, Patient Position: Sitting)   Pulse 88   Temp 98.3 F (36.8 C) (Oral)   Resp 16   Wt 142 lb 4 oz (64.5 kg)   SpO2 98%   BMI 24.42 kg/m  General: Alert, oriented, no acute distress. HEENT: Normocephalic, atraumatic, sclera anicteric. Chest: Unlabored breathing on room air.  Laboratory & Radiologic Studies: CBC    Component Value Date/Time   WBC 3.3 (L) 06/16/2022 1038   RBC 3.05 (L) 06/16/2022 1038   HGB  10.0 (L) 06/16/2022 1038   HGB 9.9 (L) 05/21/2022 0906   HGB 12.9 11/06/2016 1102   HCT 31.6 (L) 06/16/2022 1038   HCT 31.7 (L) 11/19/2020 0752   HCT 38.0 11/06/2016 1102   PLT 111 (L) 06/16/2022 1038   PLT 90 (L) 05/21/2022 0906   PLT 89 (L) 11/06/2016 1102   MCV 103.6 (H) 06/16/2022 1038   MCV 90.0 11/06/2016 1102   MCH 32.8 06/16/2022 1038   MCHC 31.6 (L) 06/16/2022 1038   RDW 16.8 (H) 06/16/2022 1038   RDW 14.1 11/06/2016 1102   LYMPHSABS 1,129 06/16/2022 1038   LYMPHSABS 1.6 11/06/2016 1102   MONOABS 0.5 05/21/2022 0906   MONOABS 0.5 11/06/2016 1102   EOSABS 30 06/16/2022 1038   EOSABS 0.2 11/06/2016 1102   BASOSABS 30 06/16/2022 1038   BASOSABS 0.0 11/06/2016 1102      Latest Ref Rng & Units 06/16/2022   10:38 AM 05/21/2022    9:06 AM 05/12/2022    9:08 AM  BMP  Glucose 65 - 99 mg/dL 100  165  192   BUN 7 - 25 mg/dL 21  24  24    Creatinine 0.60 - 0.95 mg/dL 1.16  1.25  1.19   BUN/Creat Ratio 6 - 22 (calc) 18     Sodium 135 - 146 mmol/L 144  141  142   Potassium 3.5 - 5.3 mmol/L 4.1  3.4  3.2   Chloride 98 - 110 mmol/L 106  106  107   CO2 20 - 32 mmol/L 31  30  30    Calcium 8.6 - 10.4 mg/dL 9.5  9.8  9.9    Assessment & Plan: BAILLEY GUILFORD is a 86 y.o. woman with slowly progressive platinum resistant ovarian cancer. TMB low, MSS  I spent some time talking with the patient and her husband.  Luckily, she overall has a very low symptom burden and good quality of life at this time.  She is aware that her cancer is not curable.  The goal of any additional cancer directed treatment at this time would be for disease stabilization or slowing disease progression to maintain quality of life.  Patient has had multiple conversations with Dr. [name] about options for platinum resistant ovarian cancer treatment.  She is wanting to continue with cancer directed therapy.  While her disease has shown some progression on imaging, this has been very slow.  She has done well on carboplatin  and Gemzar.  I share the concern regarding the quantity of carboplatin that she has received (18 cycles) would recommend change in therapy.  We discussed the option of considering clinical trial.  We have 2 clinical trials open in the setting of ovarian cancer recurrence, specifically platinum resistant disease.  I do not know that she would be a candidate for either, but offered that I could send her information to the study team's for review.  Her preference at this time is not to enroll in a clinical trial.  In terms of cancer directed therapy, we discussed option of single agent bevacizumab, Taxotere, or Doxil.  I think the patient is a good candidate for bevacizumab and this would likely be able to preserve her overall quality of life.  We discussed some of the more common as well as serious but less frequent side effects.  With regard to Taxotere, I agree that her peripheral neuropathy would need to be followed very closely and that there can be worsening of neuropathy with Taxotere.  While Doxil has an excellent side effect profile, the patient has had Adriamycin previously with her breast cancer history.  I recommend that we request her tumor at [facility] to be tested for folate receptor alpha.  This would give [place] the information of whether she would be a candidate for Mirvetuximab in the future.  We also discussed the option of hormonal therapy, which would avoid need for IV infusion.  The patient and her husband would like some time to think about their options.  I have discussed our conversation with Dr. [name] who will speak with the patient in the near future about treatment decisions.  38 minutes of total time was spent for this patient encounter, including preparation, face-to-face counseling with the patient and coordination of care, and documentation of the encounter.  Jeral Pinch, MD  Division of Gynecologic Oncology  Department of Obstetrics and Gynecology  [facility] of [facility]

## 2022-07-01 ENCOUNTER — Telehealth: Payer: Self-pay

## 2022-07-01 ENCOUNTER — Other Ambulatory Visit: Payer: Self-pay

## 2022-07-01 ENCOUNTER — Inpatient Hospital Stay: Payer: PPO | Attending: Gynecologic Oncology | Admitting: Hematology and Oncology

## 2022-07-01 VITALS — BP 120/62 | HR 76 | Temp 97.6°F | Resp 18 | Ht 64.0 in | Wt 144.6 lb

## 2022-07-01 DIAGNOSIS — Z5111 Encounter for antineoplastic chemotherapy: Secondary | ICD-10-CM | POA: Diagnosis not present

## 2022-07-01 DIAGNOSIS — R21 Rash and other nonspecific skin eruption: Secondary | ICD-10-CM | POA: Diagnosis not present

## 2022-07-01 DIAGNOSIS — Z170 Estrogen receptor positive status [ER+]: Secondary | ICD-10-CM | POA: Diagnosis not present

## 2022-07-01 DIAGNOSIS — C561 Malignant neoplasm of right ovary: Secondary | ICD-10-CM | POA: Insufficient documentation

## 2022-07-01 DIAGNOSIS — Z853 Personal history of malignant neoplasm of breast: Secondary | ICD-10-CM

## 2022-07-01 DIAGNOSIS — C786 Secondary malignant neoplasm of retroperitoneum and peritoneum: Secondary | ICD-10-CM | POA: Insufficient documentation

## 2022-07-01 DIAGNOSIS — D61818 Other pancytopenia: Secondary | ICD-10-CM | POA: Diagnosis not present

## 2022-07-01 DIAGNOSIS — C50919 Malignant neoplasm of unspecified site of unspecified female breast: Secondary | ICD-10-CM | POA: Diagnosis not present

## 2022-07-01 DIAGNOSIS — T451X5A Adverse effect of antineoplastic and immunosuppressive drugs, initial encounter: Secondary | ICD-10-CM | POA: Diagnosis not present

## 2022-07-01 DIAGNOSIS — Z79811 Long term (current) use of aromatase inhibitors: Secondary | ICD-10-CM | POA: Insufficient documentation

## 2022-07-01 DIAGNOSIS — Z7189 Other specified counseling: Secondary | ICD-10-CM

## 2022-07-01 DIAGNOSIS — Z96642 Presence of left artificial hip joint: Secondary | ICD-10-CM | POA: Diagnosis not present

## 2022-07-01 DIAGNOSIS — C569 Malignant neoplasm of unspecified ovary: Secondary | ICD-10-CM

## 2022-07-01 DIAGNOSIS — G620 Drug-induced polyneuropathy: Secondary | ICD-10-CM | POA: Diagnosis not present

## 2022-07-01 DIAGNOSIS — Z79899 Other long term (current) drug therapy: Secondary | ICD-10-CM | POA: Diagnosis not present

## 2022-07-01 DIAGNOSIS — Z923 Personal history of irradiation: Secondary | ICD-10-CM | POA: Insufficient documentation

## 2022-07-01 DIAGNOSIS — Z9221 Personal history of antineoplastic chemotherapy: Secondary | ICD-10-CM | POA: Insufficient documentation

## 2022-07-01 MED ORDER — DEXAMETHASONE 4 MG PO TABS: ORAL_TABLET | ORAL | 1 refills | Status: DC

## 2022-07-01 NOTE — Progress Notes (Unsigned)
[facility] OFFICE PROGRESS NOTE  Patient Care Team: [name], MD as PCP - Kinnie Scales, OD as Referring Physician (Optometry) [name], MD as Consulting Physician (Dermatology) Latanya Maudlin, MD as Consulting Physician (Orthopedic Surgery) Inda Castle, MD (Inactive) as Consulting Physician (Gastroenterology) Heath Lark, MD as Consulting Physician (Hematology and Oncology) Madelon Lips, MD as Consulting Physician (Nephrology) Carleene Mains, [facility] (Pharmacist)  ASSESSMENT & PLAN:  Ovarian cancer [facility]) We discussed the current guidelines and review options I have also reviewed the case with the GYN surgeon I explained to her and her husband that other treatment options are available but I anticipate high risk of side effects The more effective treatment would involve Taxotere but at the expense of risk of neuropathy and hair loss Single agent bevacizumab or Doxil are also reasonable options  I reviewed the current guidelines with her and ultimately she elected to try taxotere This is an FDA approved drug Goals of treatment is palliative  Gynecol Oncol. 2003 Feb;88(2):130-5. 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 Rose PG1, Blessing JA, Agnew HG, [place], Golinda D, [place] [facility]. Abstract OBJECTIVES:  Docetaxel is an inhibitor of microtubule depolymerization and has demonstrated activity in paclitaxel-resistant breast cancer and gynecologic cancer. The Gynecologic Oncology Group (GOG) conducted a study of docetaxel in paclitaxel-resistant ovarian and peritoneal carcinoma to determine its activity, and nature and degree of toxicity, in this cohort of patients. METHODS:  Patients with platinum- and paclitaxel-resistant ovarian or peritoneal carcinoma, defined as progression while on or within 6 months of therapy, were eligible if they had measurable  disease and had not received more than one chemotherapy regimen. Docetaxel at a dose of 100 mg/m(2) was administered iv over 1 h every 21 days. A prophylactic regimen of oral dexamethasone 8 mg bid was begun 24 h before docetaxel administration and continued for 48 h thereafter. Hepatic function was strictly monitored. RESULTS:  Sixty patients were entered and treated with a total of 256 courses, with all 60 evaluable for toxicity and 58 evaluable for response. Responses were observed in 22.4% of patients, with 5.2% achieving complete response and 17.2% achieving partial response (95% CI, 12.5-35.3%). The median duration of response was 2.5 months. The likelihood of observing a response did not appear to be related to the length of the prior paclitaxel-free interval or duration of prior paclitaxel infusions. The principal adverse effect of grade 4 neutropenia occurred in 75% of patients. There was one treatment-related death. Dose reductions were required in 36% of patients. 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  We discussed the role of chemotherapy. The intent is for palliative.  We discussed some of the risks, benefits, side-effects of Docetaxel.   Some of the short term side-effects included, though not limited to, risk of severe allergic reaction, fatigue, weight loss, pancytopenia, life-threatening infections, need for transfusions of blood products, nausea, vomiting, change in bowel habits, loss of hair, admission to hospital for various reasons, and risks of death.   Long term side-effects are also discussed including risks of infertility, permanent damage to nerve function, chronic fatigue, and rare secondary malignancy including bone marrow disorders.   The patient is aware that the response rates discussed earlier is not  guaranteed.   After a long discussion, patient made an informed decision  to proceed with the prescribed plan of care.   Patient education material was dispensed Due to anticipated high risks of neurotoxicity and neutropenia, I plan to prescribe upfront dose reduction instead of 100 mg/m2 as described above I do not plan prophylactic G-CSF support    Chemotherapy-induced peripheral neuropathy (HCC) We discussed strategies including cryotherapy  Pancytopenia, acquired ([place]) She has prolonged pancytopenia after each cycle of treatment We discussed risk bleeding with her low platelet count, infection with low WBC and fatigue with anemia She wants to proceed with treatment  Goals of care, counseling/discussion We had numerous GOC discussions in the past She understood treatment goal is strictly palliative   Orders Placed This Encounter  Procedures   CBC with Differential ([place] Only)    Standing Status:   Standing    Number of Occurrences:   20    Standing Expiration Date:   07/02/2023   CMP ([place] only)    Standing Status:   Standing    Number of Occurrences:   20    Standing Expiration Date:   07/02/2023    All questions were answered. The patient knows to call the clinic with any problems, questions or concerns. The total time spent in the appointment was 40 minutes encounter with patients including review of chart and various tests results, discussions about plan of care and coordination of care plan   Heath Lark, MD 07/02/2022 7:44 AM  INTERVAL HISTORY: Please see below for problem oriented charting. she returns for treatment follow-up and discussion related to next line of treatment She has obtained second opinion with Dr. Berline Lopes yesterday She is interested to continue treatment and is aware that treatment goal is strictly palliative in nature We spent more than 40 mins on treatment option discussion  I have reviewed the past medical history, past surgical  history, social history and family history with the patient and they are unchanged from previous note.  ALLERGIES:  is allergic to keflex [cephalexin], meloxicam, prednisone, premarin [estrogens conjugated], and trovan [alatrofloxacin].  MEDICATIONS:  Current Outpatient Medications  Medication Sig Dispense Refill   acetaminophen (TYLENOL) 325 MG tablet Take 325 mg by mouth every 6 (six) hours as needed for headache.     allopurinol (ZYLOPRIM) 100 MG tablet Take 1 tablet (100 mg total) by mouth daily. FOR GOUT PREVENTION, 90 tablet 3   amoxicillin (AMOXIL) 500 MG tablet SMARTSIG:4 Tablet(s) By Mouth     blood glucose meter kit and supplies KIT Dispense based on patient and insurance preference. Use to check blood sugar once daily. Dx: E11.22, N18.30 1 each 12   Cholecalciferol (VITAMIN D3) 5000 units CAPS Take 5,000 Units by mouth daily.     cyanocobalamin 1000 MCG tablet Take 1,000 mcg by mouth daily.     dexamethasone (DECADRON) 4 MG tablet Take 2 tablets the morning before chemotherapy and then 2 tablets daily for 2 days after chemo 30 tablet 1   ezetimibe (ZETIA) 10 MG tablet TAKE 1 TABLET BY MOUTH DAILY FOR CHOLESTEROL 90 tablet 3   gabapentin (NEURONTIN) 300 MG capsule Take 1 capsule 4 x  /day for Pain 360 capsule 3   glimepiride (AMARYL) 1 MG tablet Take 1 tab by mouth in the morning if fasting is running 150+. Please do not take this medication if you are ill or not eating as this can cause a low blood sugar. (Patient taking differently: Take 1 mg by mouth See admin instructions. Take 1 tab by mouth daily as needed if  fasting is running 150+. Please do not take this medication if you are ill or not eating as this can cause a low blood sugar.) 90 tablet 1   glucose blood test strip Use to test blood sugar once daily. Dx: E11.22, N18.30 100 each 12   hydrochlorothiazide (HYDRODIURIL) 25 MG tablet Take 1 tablet Daily for BP & Fluid Retention                                        /                           TAKE                           BY                   MOUTH 90 tablet 3   Lancets MISC Use to test blood sugar once daily. Dx: E11.22, N18.30 200 each 11   lidocaine-prilocaine (EMLA) cream Apply 1 application. topically daily as needed (for port access). 30 g 3   loratadine (CLARITIN) 10 MG tablet Take 10 mg by mouth daily as needed for allergies.     Magnesium 250 MG TABS Take by mouth daily.     metoprolol succinate (TOPROL-XL) 25 MG 24 hr tablet Take  1 tablet  Daily  for BP       /TAKE 1 TABLET BY MOUTH DAILY FOR BLOOD PRESSURE (Patient taking differently: Take 25 mg by mouth daily.) 90 tablet 3   ondansetron (ZOFRAN) 8 MG tablet Take 1 tablet (8 mg total) by mouth every 8 (eight) hours as needed for refractory nausea / vomiting. 30 tablet 1   PFIZER-BIONT COVID-19 VAC-TRIS SUSP injection      polyethylene glycol (MIRALAX) 17 g packet Take 17 g by mouth 2 (two) times daily. 17 grams in 6 oz of favorite drink twice a day until bowel movement.  LAXITIVE.  Restart if two days since last bowel movement 14 packet 0   rosuvastatin (CRESTOR) 5 MG tablet TAKE 1 TABLET BY MOUTH IN THE EVENING THREE TIMES WEEKLY FOR CHOLESTEROL 36 tablet 3   trolamine salicylate (ASPERCREME) 10 % cream Apply 1 application topically as needed for muscle pain (Use if you still feel tingles in feet in the morning after taking Gabapentin).     vitamin C (ASCORBIC ACID) 500 MG tablet Take 500 mg by mouth every evening.      No current facility-administered medications for this visit.    SUMMARY OF ONCOLOGIC HISTORY: Oncology History Overview Note  HRD positive Progressed and intolerant to olaparib Progressed on carboplatin, taxol and gemzar   History of left breast cancer  02/26/2016 Initial Diagnosis   Left breast biopsy 11:00 position: invasive ductal carcinoma with DCIS, ER 90%, PR 10%, HER-2 negative, Ki-67 30%, grade 2, 2.2 cm palpable lesion T2 N0 stage II a clinical stage   03/18/2016 Surgery   Left  lumpectomy: Invasive ductal carcinoma, grade 2, 6.3 cm, with high-grade DCIS, margins negative, 0/4 lymph nodes negative, ER 90%, via 10%, HER-2 negative ratio 0.97, Ki-67 30%, T3 N0 stage IIB   03/25/2016 Procedure   Genetic testing is negative for pathogenic mutations within any of the 20 Genes on the breast/ovarian cancer panel   04/04/2016 Oncotype testing   Oncotype DX  recurrence score 37, 25% 10 year distant risk of recurrence   04/17/2016 - 07/31/2016 Chemotherapy   Adjuvant chemotherapy with dose dense Adriamycin and Cytoxan followed by Abraxane weekly 8 ( discontinued due to neuropathy)   09/01/2016 - 09/26/2016 Radiation Therapy   Adj XRT 1) Left breast: 42.5 Gy in 17 fractions. 2) Left breast boost: 7.5 Gy in 3 fractions.   12/02/2016 -  Anti-estrogen oral therapy   Anastrozole 1 mg daily   09/05/2021 - 05/21/2022 Chemotherapy   Patient is on Treatment Plan : OVARIAN RECURRENT 3RD LINE Carboplatin D1 / Gemcitabine D1,8 (4/800) q21d     07/08/2022 -  Chemotherapy   Patient is on Treatment Plan : ovarian Docetaxel q21d     Ovarian cancer ([place])  04/03/2020 Imaging   US pelvis Complex cystic and solid mass in LEFT adnexa 12.5 cm diameter question cystic ovarian neoplasm; recommend correlation with serum tumor markers and further evaluation by MR imaging with and without contrast.     04/04/2020 Imaging   US venous Doppler No evidence of deep venous thrombosis in the right lower extremity. Left common femoral vein also patent.   04/15/2020 Imaging   MRI pelvis 1. Large complex solid and cystic mass arising from the right adnexa measuring 10.8 by 11.5 by 11.1 cm. This has an aggressive appearance with extensive enhancing mural soft tissue components. Findings are highly suspicious for malignant ovarian neoplasm. 2. Extensive bilateral retroperitoneal and bilateral iliac adenopathy compatible with metastatic disease. 3. Signs of extensive peritoneal carcinomatosis including ascites,  enhancement, thickening and nodularity of the peritoneal reflections, omental caking and bulky peritoneal nodularity. 4. Suspected serosal involvement of the dome of bladder with loss of normal fat plane. Cannot rule out mural invasion by tumor.   04/17/2020 Tumor Marker   Patient's tumor was tested for the following markers: CA-125 Results of the tumor marker test revealed 1762.   04/20/2020 Cancer Staging   Staging form: Ovary, Fallopian Tube, and Primary Peritoneal Carcinoma, AJCC 8th Edition - Clinical: FIGO Stage IIIC (cT3, cN1, cM0) - Signed by Heath Lark, MD on 04/20/2020   04/23/2020 Imaging   1. Redemonstrated dominant mixed solid and cystic mass arising from the vicinity of the right ovary measuring at least 11.5 x 10.7 cm, not significantly changed compared to prior MR, and consistent with primary ovarian malignancy.  2. Numerous bulky retroperitoneal, bilateral iliac, and pelvic sidewall lymph nodes. 3. Moderate volume ascites throughout the abdomen and pelvis with subtle thickening and nodularity throughout the peritoneum, and extensive bulky nodular metastatic disease of the omentum. 4. Constellation of findings is consistent with advanced nodal and peritoneal metastatic disease. 5. There are prominent subcentimeter epicardial lymph nodes, nonspecific although suspicious for nodal metastatic disease. No definite nodal metastatic disease in the chest. Attention on follow-up. 6. There are multiple small subpleural nodules at the right lung base overlying the diaphragm, measuring up to 7 mm. These are generally nonspecific and less favored to represent pulmonary metastatic disease given distribution. Attention on follow-up. 7. Somewhat coarse contour of the liver, suggestive of cirrhosis, although out without overt morphologic stigmata. 8. Aortic Atherosclerosis (ICD10-I70.0).     04/24/2020 Procedure   Successful placement of a right internal jugular approach power injectable  Port-A-Cath. The catheter is ready for immediate use.     05/02/2020 Tumor Marker   Patient's tumor was tested for the following markers: CA-125 Results of the tumor marker test revealed 1921   05/03/2020 - 12/12/2020 Chemotherapy   The patient had carboplatin and  taxol for chemotherapy treatment.     05/08/2020 Procedure   Successful ultrasound-guided paracentesis yielding 2.6 liters of peritoneal fluid.   05/16/2020 Procedure   Successful ultrasound-guided paracentesis yielding 3.2 liters of peritoneal fluid   05/25/2020 Procedure   Successful ultrasound-guided paracentesis yielding 3 liters of peritoneal fluid.     06/01/2020 Procedure   Successful ultrasound-guided therapeutic paracentesis yielding 1.7 liters of peritoneal fluid.   06/14/2020 Tumor Marker   Patient's tumor was tested for the following markers: CA-125 Results of the tumor marker test revealed 1309   06/20/2020 Imaging   1. Dominant mixed cystic and solid mass in the central pelvis is stable in the interval. 2. Clear interval decrease in retroperitoneal and pelvic sidewall lymphadenopathy. The bulky omental disease has also clearly decreased in the interval. 3. Interval decrease in ascites. 4. Stable 8 mm subpleural nodule along the diaphragm. 5. Aortic Atherosclerosis (ICD10-I70.0).   07/10/2020 Tumor Marker   Patient's tumor was tested for the following markers: CA-125 Results of the tumor marker test revealed 220   08/03/2020 Tumor Marker   Patient's tumor was tested for the following markers: CA-125 Results of the tumor marker test revealed 53.3.   09/03/2020 Tumor Marker   Patient's tumor was tested for the following markers: CA-125 Results of the tumor marker test revealed 29.7   09/20/2020 Imaging   1. Substantial improvement. The right ovarian cystic and solid mass is half the volume that it measured on 06/20/2020. Similar reduction in the bulk of the omental caking of tumor. Prior ascites is resolved and the  retroperitoneal adenopathy is markedly improved. 2. Other imaging findings of potential clinical significance: Stable pleural-based nodularity along the right hemidiaphragm measuring about 1.1 by 0.7 by 0.3 cm. Stable hypodense lesion of the right kidney upper pole, probably a cyst although the configuration of this lesion makes it difficult to obtain accurate density measurements. Left ovarian cyst, stable. Chondrocalcinosis involving the acetabular labra. Mild lumbar spondylosis and degenerative disc disease. 3. Aortic atherosclerosis.     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 (stage ypT3c), predominantly viable with focal fibrosis and hemosiderin laden macrophages (possible mild treatment effect)  B: Ovary and fallopian tube, right, salpingo-oophorectomy - High grade serous carcinoma involving right ovary and fallopian tube with surface involvement, size up to 8 cm  - Areas of necrosis and hemosiderin laden macrophages suggestive of treatment effect - 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 - See synoptic report and comment  C: Ovary and fallopian tube, left, salpingo-oophorectomy - High grade serous carcinoma involving the left fallopian tube, size up to 0.5 cm - Ovary with no definite involvement by carcinoma identified - Nodular area of endometriosis and peritoneal inclusion cyst also present - See synoptic report and comment   Procedure:    Bilateral salpingo-oophorectomy    Procedure:    Omentectomy    Specimen Integrity of Right Ovary:    Intact with surface involvement by tumor    Specimen Integrity of Left Ovary:    Capsule intact   TUMOR Tumor Site:    Right fallopian tube: favor as primary site; also  involves right ovary and left fallopian tube  Histologic Type:    Serous carcinoma  Histologic Grade:    High grade  Tumor Size:    Greatest Dimension (Centimeters):  in fallopian tube: 1.0 cm; in ovary: 8.0 cm Ovarian Surface Involvement:    Present    Laterality:    Right  Fallopian Tube Surface Involvement:    Present    Laterality:    Bilateral  Other Tissue / Organ Involvement:    Right ovary  Other Tissue / Organ Involvement:    Right fallopian tube  Other Tissue / Organ Involvement:    Left fallopian tube  Other Tissue / Organ Involvement:    Omentum  Largest Extrapelvic Peritoneal Focus:    Macroscopic (greater than 2 cm)  Peritoneal / Ascitic Fluid:    Not submitted / unknown  Pleural Fluid:    Not submitted / unknown  Treatment Effect:    No definite or minimal response identified (chemotherapy response score [CRS] 1)   LYMPH NODES Regional Lymph Nodes:    No lymph nodes submitted or found   PATHOLOGIC STAGE CLASSIFICATION (pTNM, AJCC 8th Edition) TNM Descriptors:    y (post-treatment)  Primary Tumor (pT):    pT3c  Regional Lymph Nodes (pN):    pNX   FIGO STAGE FIGO Stage:    IIIC   ADDITIONAL FINDINGS Additional Findings:    Serous tubal intraepithelial carcinoma (STIC)  Additional Findings:    Left ovary with no definite carcinoma identified, left-sided nodule of endometriosis and peritoneal inclusion cyst    10/18/2020 - 10/20/2020 Hospital Admission   She was admitted to [facility] for interval debulking surgery   11/19/2020 Tumor Marker   Patient's tumor was tested for the following markers: CA-125. Results of the tumor marker test revealed 38.3   12/12/2020 Tumor Marker   Patient's tumor was tested for the following markers CA-125. Results of the tumor marker test revealed 42.3.   01/02/2021 Tumor Marker   Patient's tumor was tested for the following markers: CA-125. Results of the tumor marker test revealed 37.3   01/29/2021 Tumor Marker   Patient's tumor  was tested for the following markers: CA-125 Results of the tumor marker test revealed 44.3   01/29/2021 Imaging   1. Interval increase in loculated appearing ascites in the low central abdomen and pelvis. 2. There is extensive peritoneal nodularity surrounding this fluid, the nodularity itself not appreciably changed in appearance compared to prior examination. 3. Additional peritoneal nodularity and/or mesenteric lymph nodes are unchanged. 4. Interval decrease in size of a low-attenuation nodule overlying the right hemidiaphragm, now measuring 1.2 cm, previously 1.8 cm when measured similarly. Findings are consistent with treatment response of a metastatic nodule. No other evidence of intrathoracic metastatic disease. 5. Status post hysterectomy, oophorectomy, and omentectomy.   01/31/2021 - 08/26/2021 Chemotherapy   She is started on Olaparib       03/04/2021 Tumor Marker   Patient's tumor was tested for the following markers: CA-125 Results of the tumor marker test revealed 39.7   04/01/2021 Tumor Marker   Patient's tumor was tested for the following markers: CA-125 Results of the tumor marker test revealed 32.3   05/10/2021 Imaging   1. No significant change in peritoneal carcinomatosis since previous study of 3 months ago, primarily in the pelvis where there is complex fluid and peritoneal nodularity. 2. No evidence of solid visceral organ metastasis. No evidence of bowel or ureteral obstruction. 3.  Aortic Atherosclerosis (ICD10-I70.0).   06/13/2021 Tumor Marker   Patient's tumor was tested for the following markers: CA-125. Results of the tumor marker test revealed 48.3.   07/29/2021 Tumor Marker   Patient's tumor was tested for  the following markers: CA-125. Results of the tumor marker test revealed 54.   08/25/2021 Imaging   1. There is extensive peritoneal soft tissue thickening and nodularity, particularly about the low right hemipelvis, which is similar to prior examination.  Some small peritoneal nodules throughout the abdomen and pelvis are slightly enlarged, other nodules are unchanged. 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 2. No significant change in a subpleural nodule overlying the right hemidiaphragm. 3. Unchanged, loculated small volume pelvic ascites. 4. Status post hysterectomy, oophorectomy, and omentectomy. 5. Coronary artery disease.   Aortic Atherosclerosis (ICD10-I70.0).     08/26/2021 Tumor Marker   Patient's tumor was tested for the following markers: CA-125. Results of the tumor marker test revealed 56.   09/04/2021 Tumor Marker   Patient's tumor was tested for the following markers: CA-125. Results of the tumor marker test revealed 55.4.   09/05/2021 - 05/21/2022 Chemotherapy   Patient is on Treatment Plan : OVARIAN RECURRENT 3RD LINE Carboplatin D1 / Gemcitabine D1,8 (4/800) q21d     10/07/2021 Tumor Marker   Patient's tumor was tested for the following markers: CA-125. Results of the tumor marker test revealed 51.9.   11/05/2021 Tumor Marker   Patient's tumor was tested for the following markers: CA-125. Results of the tumor marker test revealed 45.6.   11/27/2021 Imaging   1. Overall stable to slightly improved ill-defined soft tissue density throughout the pelvis. 2. Slight interval decrease in size of the omental lesions. 3. Stable free pelvic fluid. 4. New surgical changes from a right hip replacement. 5. New nodular and somewhat triangular density at the left lung base is likely an area of atelectasis. 6. Aortic atherosclerosis.   12/24/2021 Tumor Marker   Patient's tumor was tested for the following markers: CA-125. Results of the tumor marker test revealed 39.6.   02/04/2022 Tumor Marker   Patient's tumor was tested for the following markers: CA-125. Results of the tumor marker test revealed 36.3.   03/05/2022 Imaging   1. Similar to mild interval increase in size of right  pericolic goiter nodules and omental nodules. 2. Similar appearance of the amorphous soft tissue within the pelvis. There is decreased fluid component   03/06/2022 Tumor Marker   Patient's tumor was tested for the following markers: CA-125. Results of the tumor marker test revealed 56.2.   04/07/2022 Tumor Marker   Patient's tumor was tested for the following markers: CA-125. Results of the tumor marker test revealed 65.5.   05/13/2022 Tumor Marker   Patient's tumor was tested for the following markers: CA-125. Results of the tumor marker test revealed 66.5.   06/12/2022 Imaging   1. Slight increase in prominence of the peritoneal and omental tumor deposits, which are most thickly banded in the lower pelvis. 2. Questionable wall thickening in the descending duodenum, cannot exclude duodenitis. 3. Other imaging findings of potential clinical significance: Bibasilar scarring. Aortic Atherosclerosis (ICD10-I70.0). Cholelithiasis.   07/08/2022 -  Chemotherapy   Patient is on Treatment Plan : ovarian Docetaxel q21d       PHYSICAL EXAMINATION: ECOG PERFORMANCE STATUS: 1 - Symptomatic but completely ambulatory  Vitals:   07/01/22 1238  BP: 120/62  Pulse: 76  Resp: 18  Temp: 97.6 F (36.4 C)  SpO2: 99%   Filed Weights   07/01/22 1238  Weight: 144 lb 9.6 oz (65.6 kg)    GENERAL:alert, no distress and comfortable NEURO: alert & oriented x 3 with fluent speech, no focal motor/sensory deficits  LABORATORY  DATA:  I have reviewed the data as listed    Component Value Date/Time   NA 144 06/16/2022 1038   NA 141 11/06/2016 1102   K 4.1 06/16/2022 1038   K 3.9 11/06/2016 1102   CL 106 06/16/2022 1038   CO2 31 06/16/2022 1038   CO2 27 11/06/2016 1102   GLUCOSE 100 (H) 06/16/2022 1038   GLUCOSE 130 11/06/2016 1102   BUN 21 06/16/2022 1038   BUN 18.0 11/06/2016 1102   CREATININE 1.16 (H) 06/16/2022 1038   CREATININE 1.3 (H) 11/06/2016 1102   CALCIUM 9.5 06/16/2022 1038   CALCIUM  10.2 11/06/2016 1102   PROT 6.6 06/16/2022 1038   PROT 7.1 11/06/2016 1102   ALBUMIN 3.5 05/21/2022 0906   ALBUMIN 3.7 11/06/2016 1102   AST 41 (H) 06/16/2022 1038   AST 36 05/21/2022 0906   AST 52 (H) 11/06/2016 1102   ALT 19 06/16/2022 1038   ALT 15 05/21/2022 0906   ALT 58 (H) 11/06/2016 1102   ALKPHOS 108 05/21/2022 0906   ALKPHOS 159 (H) 11/06/2016 1102   BILITOT 0.6 06/16/2022 1038   BILITOT 0.6 05/21/2022 0906   BILITOT 0.53 11/06/2016 1102   GFRNONAA 43 (L) 05/21/2022 0906   GFRNONAA 34 (L) 02/21/2021 1414   GFRAA 40 (L) 02/21/2021 1414    No results found for: "SPEP", "UPEP"  Lab Results  Component Value Date   WBC 3.3 (L) 06/16/2022   NEUTROABS 1,462 (L) 06/16/2022   HGB 10.0 (L) 06/16/2022   HCT 31.6 (L) 06/16/2022   MCV 103.6 (H) 06/16/2022   PLT 111 (L) 06/16/2022      Chemistry      Component Value Date/Time   NA 144 06/16/2022 1038   NA 141 11/06/2016 1102   K 4.1 06/16/2022 1038   K 3.9 11/06/2016 1102   CL 106 06/16/2022 1038   CO2 31 06/16/2022 1038   CO2 27 11/06/2016 1102   BUN 21 06/16/2022 1038   BUN 18.0 11/06/2016 1102   CREATININE 1.16 (H) 06/16/2022 1038   CREATININE 1.3 (H) 11/06/2016 1102      Component Value Date/Time   CALCIUM 9.5 06/16/2022 1038   CALCIUM 10.2 11/06/2016 1102   ALKPHOS 108 05/21/2022 0906   ALKPHOS 159 (H) 11/06/2016 1102   AST 41 (H) 06/16/2022 1038   AST 36 05/21/2022 0906   AST 52 (H) 11/06/2016 1102   ALT 19 06/16/2022 1038   ALT 15 05/21/2022 0906   ALT 58 (H) 11/06/2016 1102   BILITOT 0.6 06/16/2022 1038   BILITOT 0.6 05/21/2022 0906   BILITOT 0.53 11/06/2016 1102       RADIOGRAPHIC STUDIES: I have personally reviewed the radiological images as listed and agreed with the findings in the report. CT ABDOMEN PELVIS W CONTRAST  Result Date: 06/12/2022 CLINICAL DATA:  Ovarian cancer restaging. History of left breast cancer. Ongoing chemotherapy. * Tracking Code: BO * EXAM: CT ABDOMEN AND PELVIS WITH  CONTRAST TECHNIQUE: Multidetector CT imaging of the abdomen and pelvis was performed using the standard protocol following bolus administration of intravenous contrast. RADIATION DOSE REDUCTION: This exam was performed according to the departmental dose-optimization program which includes automated exposure control, adjustment of the mA and/or kV according to patient size and/or use of iterative reconstruction technique. CONTRAST:  39m OMNIPAQUE IOHEXOL 300 MG/ML  SOLN COMPARISON:  Multiple exams, including 03/05/2022 FINDINGS: Lower chest: Descending thoracic aortic atherosclerotic calcification. Mild scarring in the right middle lobe and left lower lobe as well as  the lingula, unchanged. Hepatobiliary: Mildly contracted gallbladder. Dependent gallstones in the gallbladder. Cannot exclude gallbladder wall thickening given the degree of contraction. No focal hepatic lesion is identified. No biliary dilatation. Pancreas: Unremarkable Spleen: The spleen measures 11.8 by 5.9 by 11.5 cm (volume = 420 cm^3), upper normal size. No focal splenic lesion. Adrenals/Urinary Tract: Bladder and distal ureters partially obscured by streak artifact from the patient's left hip implant. No hydronephrosis or hydroureter. Complex but benign appearing right kidney upper pole cyst. 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 Stomach/Bowel: Questionable wall thickening of the descending duodenum, cannot exclude duodenitis. Lower pelvic bowel partially obscured by streak artifact. No dilated bowel observed. Vascular/Lymphatic: Atherosclerosis is present, including aortoiliac atherosclerotic disease. Left periaortic lymph node 0.8 cm in short axis on image 26 series 2, formerly the same. Mild periaortic retroperitoneal stranding. Right external iliac node 0.9 cm in short axis on image 64 series 2, formerly 0.7 cm. Reproductive: Uterus absent. Some of the pelvic soft tissue density discussed in the section below abuts  the right side of the vaginal cuff. Other: There is a band of tumor rind in the pelvis eccentric to the right measuring about 1.5 cm and thickness on image 60 of series 2, formerly 1.2 cm in this vicinity. This band of tumor extends down towards the right vaginal cuff in the roof of the urinary bladder and subjectively appears mildly increased from prior. This also abuts the right side of the rectum Scattered tumor nodularity along the omentum and pelvic services. Index left omental nodule 0.8 cm transversely on image 37 series 2, formerly 0.7 cm. A tumor deposit along the right lower paracolic gutter measures 0.7 cm in short axis on image 54 series 2, formerly the same. There is some mild stranding in the mesentery. Tumor nodularity just below the inferior right hepatic lobe is noted with the dominant nodule in this vicinity measuring 1.2 by 0.9 cm on image 38 series 2, formerly 1.0 by 0.8 cm. Musculoskeletal: Right total hip prosthesis. 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 IMPRESSION: 1. Slight increase in prominence of the peritoneal and omental tumor deposits, which are most thickly banded in the lower pelvis. 2. Questionable wall thickening in the descending duodenum, cannot exclude duodenitis. 3. Other imaging findings of potential clinical significance: Bibasilar scarring. Aortic Atherosclerosis (ICD10-I70.0). Cholelithiasis. Electronically Signed   By: Van Clines M.D.   On: 06/12/2022 15:20

## 2022-07-01 NOTE — Progress Notes (Signed)
Pharmacist Chemotherapy Monitoring - Initial Assessment    Anticipated start date: 07/08/22   The following has been reviewed per standard work regarding the patient's treatment regimen: The patient's diagnosis, treatment plan and drug doses, and organ/hematologic function Lab orders and baseline tests specific to treatment regimen  The treatment plan start date, drug sequencing, and pre-medications Prior authorization status  Patient's documented medication list, including drug-drug interaction screen and prescriptions for anti-emetics and supportive care specific to the treatment regimen The drug concentrations, fluid compatibility, administration routes, and timing of the medications to be used The patient's access for treatment and lifetime cumulative dose history, if applicable  The patient's medication allergies and previous infusion related reactions, if applicable   Changes made to treatment plan:  N/A  Follow up needed:  Pending authorization for treatment    Judge Stall, [facility], 07/01/2022  1:56 PM

## 2022-07-01 NOTE — Assessment & Plan Note (Addendum)
We discussed the current guidelines and review options I have also reviewed the case with the GYN surgeon I explained to her and her husband that other treatment options are available but I anticipate high risk of side effects The more effective treatment would involve Taxotere but at the expense of risk of neuropathy and hair loss Single agent bevacizumab or Doxil are also reasonable options  I reviewed the current guidelines with her and ultimately she elected to try taxotere This is an FDA approved drug Goals of treatment is palliative  Gynecol Oncol. 2003 Feb;88(2):130-5. 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 Rose PG1, Blessing JA, Colby HG, [place], Hosmer D, [place] [facility]. Abstract OBJECTIVES:  Docetaxel is an inhibitor of microtubule depolymerization and has demonstrated activity in paclitaxel-resistant breast cancer and gynecologic cancer. The Gynecologic Oncology Group (GOG) conducted a study of docetaxel in paclitaxel-resistant ovarian and peritoneal carcinoma to determine its activity, and nature and degree of toxicity, in this cohort of patients. METHODS:  Patients with platinum- and paclitaxel-resistant ovarian or peritoneal carcinoma, defined as progression while on or within 6 months of therapy, were eligible if they had measurable disease and had not received more than one chemotherapy regimen. Docetaxel at a dose of 100 mg/m(2) was administered iv over 1 h every 21 days. A prophylactic regimen of oral dexamethasone 8 mg bid was begun 24 h before docetaxel administration and continued for 48 h thereafter. Hepatic function was strictly monitored. RESULTS:  Sixty patients were entered and treated with a total of 256 courses, with all 60 evaluable for toxicity and 58 evaluable for response. Responses were observed in 22.4% of patients, with 5.2% achieving complete response and 17.2% achieving  partial response (95% CI, 12.5-35.3%). The median duration of response was 2.5 months. The likelihood of observing a response did not appear to be related to the length of the prior paclitaxel-free interval or duration of prior paclitaxel infusions. The principal adverse effect of grade 4 neutropenia occurred in 75% of patients. There was one treatment-related death. Dose reductions were required in 36% of patients. 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  We discussed the role of chemotherapy. The intent is for palliative.  We discussed some of the risks, benefits, side-effects of Docetaxel.   Some of the short term side-effects included, though not limited to, risk of severe allergic reaction, fatigue, weight loss, pancytopenia, life-threatening infections, need for transfusions of blood products, nausea, vomiting, change in bowel habits, loss of hair, admission to hospital for various reasons, and risks of death.   Long term side-effects are also discussed including risks of infertility, permanent damage to nerve function, chronic fatigue, and rare secondary malignancy including bone marrow disorders.   The patient is aware that the response rates discussed earlier is not guaranteed.   After a long discussion, patient made an informed decision to proceed with the prescribed plan of care.   Patient education material was dispensed Due to anticipated high risks of neurotoxicity and neutropenia, I plan to prescribe upfront dose reduction instead of 100 mg/m2 as described above I do not plan prophylactic G-CSF support

## 2022-07-01 NOTE — Telephone Encounter (Signed)
Called and scheduled appt at 1 pm today with Dr. [name], arrive 30 mins early for appt. Kayla Baker verbalized understanding.

## 2022-07-01 NOTE — Telephone Encounter (Signed)
Spoke with Holley Raring in pathology at [facility]. Per Dr. Berline Lopes please perform folate receptor alpha testing on patients tumor. Specimen ID [id number]. Glenda requests this be sent via fax to [phone]. Fax successfully sent.

## 2022-07-01 NOTE — Progress Notes (Signed)
DISCONTINUE ON PATHWAY REGIMEN - Ovarian     A cycle is every 21 days:     Gemcitabine      Carboplatin   **Always confirm dose/schedule in your pharmacy ordering system**  REASON: Disease Progression PRIOR TREATMENT: [id number]: Carboplatin AUC=4 D1 + Gemcitabine 800 mg/m2 D1, 8 q21 Days; Re-evaluate Every 3 Cycles, Treat until Complete Response, Unacceptable Toxicity, or Disease Progression TREATMENT RESPONSE: Progressive Disease (PD)  START OFF PATHWAY REGIMEN - Ovarian   [id number]:Docetaxel 100 mg/m2 IV D1 q21 Days:   A cycle is every 21 days:     Docetaxel   **Always confirm dose/schedule in your pharmacy ordering system**  Patient Characteristics: Recurrent or Progressive Disease, Fourth Line and Beyond, Low, Medium, or Unknown Folate Receptor Alpha Expression OR  Prior Mirvetuximab OR Not a Candidate for Mirvetuximab BRCA Mutation Status: Present (Germline) Therapeutic Status: Recurrent or Progressive Disease Line of Therapy: Fourth Line and Beyond Folate Receptor Alpha (FR?) Expression: Unknown Mirvetuximab Candidacy: Not a Candidate for Mirvetuximab OR Prior Mirvetuximab Intent of Therapy: Non-Curative / Palliative Intent, Discussed with Patient

## 2022-07-02 ENCOUNTER — Encounter: Payer: Self-pay | Admitting: Hematology and Oncology

## 2022-07-02 NOTE — Assessment & Plan Note (Signed)
We discussed strategies including cryotherapy

## 2022-07-02 NOTE — Assessment & Plan Note (Signed)
We had numerous GOC discussions in the past She understood treatment goal is strictly palliative

## 2022-07-02 NOTE — Assessment & Plan Note (Signed)
She has prolonged pancytopenia after each cycle of treatment We discussed risk bleeding with her low platelet count, infection with low WBC and fatigue with anemia She wants to proceed with treatment

## 2022-07-03 ENCOUNTER — Telehealth: Payer: Self-pay | Admitting: *Deleted

## 2022-07-03 NOTE — Patient Outreach (Signed)
  Care Coordination   Initial Visit Note   07/03/2022 Name: SHERAL PFAHLER MRN: [id number] DOB: 21-Feb-1936  Lorenda Ishihara Chizmar is a 86 y.o. year old female who sees [name], MD for primary care. I spoke with  Lorenda Ishihara Rabon by phone today  What matters to the patients health and wellness today?  Declines at this time.    Goals Addressed   None     SDOH assessments and interventions completed:  No     Care Coordination Interventions Activated:  No  Care Coordination Interventions:  No, not indicated   Follow up plan: No further intervention required.   Encounter Outcome:  Pt. Refused  Eduard Clos MSW, LCSW Licensed Clinical Social Worker      [phone]

## 2022-07-07 MED FILL — Dexamethasone Sodium Phosphate Inj 100 MG/10ML: INTRAMUSCULAR | Qty: 1 | Status: AC

## 2022-07-08 ENCOUNTER — Inpatient Hospital Stay: Payer: PPO

## 2022-07-08 ENCOUNTER — Other Ambulatory Visit: Payer: Self-pay | Admitting: Hematology and Oncology

## 2022-07-08 VITALS — BP 142/72 | HR 78 | Temp 97.9°F | Resp 18 | Wt 144.2 lb

## 2022-07-08 DIAGNOSIS — Z95828 Presence of other vascular implants and grafts: Secondary | ICD-10-CM

## 2022-07-08 DIAGNOSIS — Z853 Personal history of malignant neoplasm of breast: Secondary | ICD-10-CM

## 2022-07-08 DIAGNOSIS — C561 Malignant neoplasm of right ovary: Secondary | ICD-10-CM | POA: Diagnosis not present

## 2022-07-08 DIAGNOSIS — Z5111 Encounter for antineoplastic chemotherapy: Secondary | ICD-10-CM | POA: Diagnosis not present

## 2022-07-08 DIAGNOSIS — C569 Malignant neoplasm of unspecified ovary: Secondary | ICD-10-CM

## 2022-07-08 LAB — CMP (CANCER CENTER ONLY)
ALT: 20 U/L (ref 0–44)
AST: 54 U/L — ABNORMAL HIGH (ref 15–41)
Albumin: 3.7 g/dL (ref 3.5–5.0)
Alkaline Phosphatase: 121 U/L (ref 38–126)
Anion gap: 6 (ref 5–15)
BUN: 26 mg/dL — ABNORMAL HIGH (ref 8–23)
CO2: 29 mmol/L (ref 22–32)
Calcium: 9.8 mg/dL (ref 8.9–10.3)
Chloride: 105 mmol/L (ref 98–111)
Creatinine: 1.24 mg/dL — ABNORMAL HIGH (ref 0.44–1.00)
GFR, Estimated: 43 mL/min — ABNORMAL LOW (ref >60)
Glucose, Bld: 188 mg/dL — ABNORMAL HIGH (ref 70–99)
Potassium: 3.9 mmol/L (ref 3.5–5.1)
Sodium: 140 mmol/L (ref 135–145)
Total Bilirubin: 0.6 mg/dL (ref 0.3–1.2)
Total Protein: 7.6 g/dL (ref 6.5–8.1)

## 2022-07-08 LAB — CBC WITH DIFFERENTIAL (CANCER CENTER ONLY)
Abs Immature Granulocytes: 0.03 10*3/uL (ref 0.00–0.07)
Basophils Absolute: 0 10*3/uL (ref 0.0–0.1)
Basophils Relative: 1 %
Eosinophils Absolute: 0 10*3/uL (ref 0.0–0.5)
Eosinophils Relative: 1 %
HCT: 31.5 % — ABNORMAL LOW (ref 36.0–46.0)
Hemoglobin: 10.4 g/dL — ABNORMAL LOW (ref 12.0–15.0)
Immature Granulocytes: 1 %
Lymphocytes Relative: 18 %
Lymphs Abs: 0.6 10*3/uL — ABNORMAL LOW (ref 0.7–4.0)
MCH: 32.7 pg (ref 26.0–34.0)
MCHC: 33 g/dL (ref 30.0–36.0)
MCV: 99.1 fL (ref 80.0–100.0)
Monocytes Absolute: 0.1 10*3/uL (ref 0.1–1.0)
Monocytes Relative: 4 %
Neutro Abs: 2.6 10*3/uL (ref 1.7–7.7)
Neutrophils Relative %: 75 %
Platelet Count: 84 10*3/uL — ABNORMAL LOW (ref 150–400)
RBC: 3.18 MIL/uL — ABNORMAL LOW (ref 3.87–5.11)
RDW: 16.6 % — ABNORMAL HIGH (ref 11.5–15.5)
WBC Count: 3.4 10*3/uL — ABNORMAL LOW (ref 4.0–10.5)
nRBC: 0 % (ref 0.0–0.2)

## 2022-07-08 MED ORDER — SODIUM CHLORIDE 0.9 % IV SOLN
10.0000 mg | Freq: Once | INTRAVENOUS | Status: AC
  Administered 2022-07-08: 10 mg via INTRAVENOUS
  Filled 2022-07-08: qty 10

## 2022-07-08 MED ORDER — SODIUM CHLORIDE 0.9% FLUSH
10.0000 mL | Freq: Once | INTRAVENOUS | Status: AC
  Administered 2022-07-08: 10 mL

## 2022-07-08 MED ORDER — SODIUM CHLORIDE 0.9% FLUSH: 10.0000 mL | INTRAVENOUS | Status: DC | PRN

## 2022-07-08 MED ORDER — HEPARIN SOD (PORK) LOCK FLUSH 100 UNIT/ML IV SOLN: 500.0000 [IU] | Freq: Once | INTRAVENOUS | Status: DC | PRN

## 2022-07-08 MED ORDER — SODIUM CHLORIDE 0.9 % IV SOLN: Freq: Once | INTRAVENOUS | Status: AC

## 2022-07-08 MED ORDER — SODIUM CHLORIDE 0.9 % IV SOLN
50.0000 mg/m2 | Freq: Once | INTRAVENOUS | Status: AC
  Administered 2022-07-08: 90 mg via INTRAVENOUS
  Filled 2022-07-08: qty 9

## 2022-07-08 NOTE — Patient Instructions (Signed)
[facility]  Discharge Instructions: Thank you for choosing [facility] to provide your oncology and hematology care.   If you have a lab appointment with the [facility], please go directly to the [facility] and check in at the registration area.   Wear comfortable clothing and clothing appropriate for easy access to any Portacath or PICC line.   We strive to give you quality time with your provider. You may need to reschedule your appointment if you arrive late (15 or more minutes).  Arriving late affects you and other patients whose appointments are after yours.  Also, if you miss three or more appointments without notifying the office, you may be dismissed from the clinic at the provider's discretion.      For prescription refill requests, have your pharmacy contact our office and allow 72 hours for refills to be completed.    Today you received the following chemotherapy and/or immunotherapy agents: Docetaxel      To help prevent nausea and vomiting after your treatment, we encourage you to take your nausea medication as directed.  BELOW ARE SYMPTOMS THAT SHOULD BE REPORTED IMMEDIATELY: *FEVER GREATER THAN 100.4 F (38 C) OR HIGHER *CHILLS OR SWEATING *NAUSEA AND VOMITING THAT IS NOT CONTROLLED WITH YOUR NAUSEA MEDICATION *UNUSUAL SHORTNESS OF BREATH *UNUSUAL BRUISING OR BLEEDING *URINARY PROBLEMS (pain or burning when urinating, or frequent urination) *BOWEL PROBLEMS (unusual diarrhea, constipation, pain near the anus) TENDERNESS IN MOUTH AND THROAT WITH OR WITHOUT PRESENCE OF ULCERS (sore throat, sores in mouth, or a toothache) UNUSUAL RASH, SWELLING OR PAIN  UNUSUAL VAGINAL DISCHARGE OR ITCHING   Items with * indicate a potential emergency and should be followed up as soon as possible or go to the Emergency Department if any problems should occur.  Please show the CHEMOTHERAPY ALERT CARD or IMMUNOTHERAPY ALERT CARD at check-in to  the Emergency Department and triage nurse.  Should you have questions after your visit or need to cancel or reschedule your appointment, please contact [facility]  Dept: [phone]  and follow the prompts.  Office hours are 8:00 a.m. to 4:30 p.m. Monday - Friday. Please note that voicemails left after 4:00 p.m. may not be returned until the following business day.  We are closed weekends and major holidays. You have access to a nurse at all times for urgent questions. Please call the main number to the clinic Dept: [phone] and follow the prompts.   For any non-urgent questions, you may also contact your provider using MyChart. We now offer e-Visits for anyone 36 and older to request care online for non-urgent symptoms. For details visit [email or web address].   Also download the MyChart app! Go to the app store, search "MyChart", open the app, select , and log in with your MyChart username and password.  Masks are optional in the cancer centers. If you would like for your care team to wear a mask while they are taking care of you, please let them know. You may have one support person who is at least 84 years old accompany you for your appointments.  Docetaxel Injection What is this medication? DOCETAXEL (doe se TAX el) treats some types of cancer. It works by slowing down the growth of cancer cells. This medicine may be used for other purposes; ask your health care provider or pharmacist if you have questions. COMMON BRAND NAME(S): Docefrez, Taxotere What should I tell my care team before I take this  medication? They need to know if you have any of these conditions: Kidney disease Liver disease Low white blood cell levels Tingling of the fingers or toes or other nerve disorder An unusual or allergic reaction to docetaxel, polysorbate 80, other medications, foods, dyes, or preservatives Pregnant or trying to get pregnant Breast-feeding How  should I use this medication? This medication is injected into a vein. It is given by your care team in a hospital or clinic setting. Talk to your care team about the use of this medication in children. Special care may be needed. Overdosage: If you think you have taken too much of this medicine contact a poison control center or emergency room at once. NOTE: This medicine is only for you. Do not share this medicine with others. What if I miss a dose? Keep appointments for follow-up doses. It is important not to miss your dose. Call your care team if you are unable to keep an appointment. What may interact with this medication? Do not take this medication with any of the following: Live virus vaccines This medication may also interact with the following: Certain antibiotics, such as clarithromycin, telithromycin Certain antivirals for HIV or hepatitis Certain medications for fungal infections, such as itraconazole, ketoconazole, voriconazole Grapefruit juice Nefazodone Supplements, such as St. John's wort This list may not describe all possible interactions. Give your health care provider a list of all the medicines, herbs, non-prescription drugs, or dietary supplements you use. Also tell them if you smoke, drink alcohol, or use illegal drugs. Some items may interact with your medicine. What should I watch for while using this medication? This medication may make you feel generally unwell. This is not uncommon as chemotherapy can affect healthy cells as well as cancer cells. Report any side effects. Continue your course of treatment even though you feel ill unless your care team tells you to stop. You may need blood work done while you are taking this medication. This medication can cause serious side effects and infusion reactions. To reduce the risk, your care team may give you other medications to take before receiving this one. Be sure to follow the directions from your care team. This  medication may increase your risk of getting an infection. Call your care team for advice if you get a fever, chills, sore throat, or other symptoms of a cold or flu. Do not treat yourself. Try to avoid being around people who are sick. Avoid taking medications that contain aspirin, acetaminophen, ibuprofen, naproxen, or ketoprofen unless instructed by your care team. These medications may hide a fever. Be careful brushing or flossing your teeth or using a toothpick because you may get an infection or bleed more easily. If you have any dental work done, tell your dentist you are receiving this medication. Some products may contain alcohol. Ask your care team if this medication contains alcohol. Be sure to tell all care teams you are taking this medicine. Certain medications, like metronidazole and disulfiram, can cause an unpleasant reaction when taken with alcohol. The reaction includes flushing, headache, nausea, vomiting, sweating, and increased thirst. The reaction can last from 30 minutes to several hours. This medication may affect your coordination, reaction time, or judgement. Do not drive or operate machinery until you know how this medication affects you. Sit up or stand slowly to reduce the risk of dizzy or fainting spells. Drinking alcohol with this medication can increase the risk of these side effects. Talk to your care team about your risk of  cancer. You may be more at risk for certain types of cancer if you take this medication. Talk to your care team if you wish to become pregnant or think you might be pregnant. This medication can cause serious birth defects if taken during pregnancy or if you get pregnant within 2 months after stopping therapy. A negative pregnancy test is required before starting this medication. A reliable form of contraception is recommended while taking this medication and for 2 months after stopping it. Talk to your care team about reliable forms of contraception. Do  not breast-feed while taking this medication and for 1 week after stopping therapy. Use a condom during sex and for 4 months after stopping therapy. Tell your care team right away if you think your partner might be pregnant. This medication can cause serious birth defects. This medication may cause infertility. Talk to your care team if you are concerned about your fertility. What side effects may I notice from receiving this medication? Side effects that you should report to your care team as soon as possible: Allergic reactions--skin rash, itching, hives, swelling of the face, lips, tongue, or throat Change in vision such as blurry vision, seeing halos around lights, vision loss Infection--fever, chills, cough, or sore throat Infusion reactions--chest pain, shortness of breath or trouble breathing, feeling faint or lightheaded Low red blood cell level--unusual weakness or fatigue, dizziness, headache, trouble breathing Pain, tingling, or numbness in the hands or feet Painful swelling, warmth, or redness of the skin, blisters or sores at the infusion site Redness, blistering, peeling, or loosening of the skin, including inside the mouth Sudden or severe stomach pain, bloody diarrhea, fever, nausea, vomiting Swelling of the ankles, hands, or feet Tumor lysis syndrome (TLS)--nausea, vomiting, diarrhea, decrease in the amount of urine, dark urine, unusual weakness or fatigue, confusion, muscle pain or cramps, fast or irregular heartbeat, joint pain Unusual bruising or bleeding Side effects that usually do not require medical attention (report to your care team if they continue or are bothersome): Change in nail shape, thickness, or color Change in taste Hair loss Increased tears This list may not describe all possible side effects. Call your doctor for medical advice about side effects. You may report side effects to FDA at 1-800-FDA-1088. Where should I keep my medication? This medication is  given in a hospital or clinic. It will not be stored at home. NOTE: This sheet is a summary. It may not cover all possible information. If you have questions about this medicine, talk to your doctor, pharmacist, or health care provider.  2023 [employer]/Gold Standard (2022-01-17 00:00:00)

## 2022-07-09 LAB — CA 125: Cancer Antigen (CA) 125: 103 U/mL — ABNORMAL HIGH (ref 0.0–38.1)

## 2022-07-21 ENCOUNTER — Encounter: Payer: Self-pay | Admitting: Gynecologic Oncology

## 2022-07-25 ENCOUNTER — Encounter: Payer: Self-pay | Admitting: Hematology and Oncology

## 2022-07-25 ENCOUNTER — Other Ambulatory Visit: Payer: Self-pay

## 2022-07-25 ENCOUNTER — Inpatient Hospital Stay (HOSPITAL_BASED_OUTPATIENT_CLINIC_OR_DEPARTMENT_OTHER): Payer: PPO | Admitting: Hematology and Oncology

## 2022-07-25 ENCOUNTER — Telehealth: Payer: Self-pay | Admitting: *Deleted

## 2022-07-25 DIAGNOSIS — R21 Rash and other nonspecific skin eruption: Secondary | ICD-10-CM | POA: Diagnosis not present

## 2022-07-25 DIAGNOSIS — D61818 Other pancytopenia: Secondary | ICD-10-CM

## 2022-07-25 DIAGNOSIS — C561 Malignant neoplasm of right ovary: Secondary | ICD-10-CM | POA: Diagnosis not present

## 2022-07-25 DIAGNOSIS — C569 Malignant neoplasm of unspecified ovary: Secondary | ICD-10-CM | POA: Diagnosis not present

## 2022-07-25 DIAGNOSIS — Z5111 Encounter for antineoplastic chemotherapy: Secondary | ICD-10-CM | POA: Diagnosis not present

## 2022-07-25 NOTE — Assessment & Plan Note (Signed)
She tolerated treatment very poorly with severe fatigue recently Her faint skin rash on her face is not compatible with allergic reaction and the patient is reassured She does not need to see me next week; I plan to see her next month for further follow-up prior to cycle 3 of therapy

## 2022-07-25 NOTE — Assessment & Plan Note (Signed)
She had history of pancytopenia and had received dose reduction with her last treatment She will return next week to get her blood count checked

## 2022-07-25 NOTE — Progress Notes (Signed)
[place] OFFICE PROGRESS NOTE  Patient Care Team: [name], MD as PCP - Kinnie Scales, OD as Referring Physician (Optometry) [name], MD as Consulting Physician (Dermatology) Latanya Maudlin, MD as Consulting Physician (Orthopedic Surgery) Inda Castle, MD (Inactive) as Consulting Physician (Gastroenterology) Heath Lark, MD as Consulting Physician (Hematology and Oncology) Madelon Lips, MD as Consulting Physician (Nephrology) Carleene Mains, [facility] (Pharmacist)  ASSESSMENT & PLAN:  Ovarian cancer [facility]) She tolerated treatment very poorly with severe fatigue recently Her faint skin rash on her face is not compatible with allergic reaction and the patient is reassured She does not need to see me next week; I plan to see her next month for further follow-up prior to cycle 3 of therapy  Facial rash This is likely due to skin sensitivity This is not an allergic reaction and she is reassured  Pancytopenia, acquired [facility]) She had history of pancytopenia and had received dose reduction with her last treatment She will return next week to get her blood count checked  No orders of the defined types were placed in this encounter.   All questions were answered. The patient knows to call the clinic with any problems, questions or concerns. The total time spent in the appointment was 20 minutes encounter with patients including review of chart and various tests results, discussions about plan of care and coordination of care plan   Heath Lark, MD 07/25/2022 3:43 PM  INTERVAL HISTORY: Please see below for problem oriented charting. she returns for urgent evaluation due to concern for skin rash She noted the skin rash on her face this last 24 hours Denies rash elsewhere She denies worsening peripheral neuropathy with treatment She complained of excessive fatigue  REVIEW OF SYSTEMS:   Constitutional: Denies fevers, chills or abnormal weight  loss Eyes: Denies blurriness of vision Ears, nose, mouth, throat, and face: Denies mucositis or sore throat Respiratory: Denies cough, dyspnea or wheezes Cardiovascular: Denies palpitation, chest discomfort or lower extremity swelling Gastrointestinal:  Denies nausea, heartburn or change in bowel habits Lymphatics: Denies new lymphadenopathy or easy bruising Neurological:Denies numbness, tingling or new weaknesses Behavioral/Psych: Mood is stable, no new changes  All other systems were reviewed with the patient and are negative.  I have reviewed the past medical history, past surgical history, social history and family history with the patient and they are unchanged from previous note.  ALLERGIES:  is allergic to keflex [cephalexin], meloxicam, prednisone, premarin [estrogens conjugated], and trovan [alatrofloxacin].  MEDICATIONS:  Current Outpatient Medications  Medication Sig Dispense Refill   acetaminophen (TYLENOL) 325 MG tablet Take 325 mg by mouth every 6 (six) hours as needed for headache.     allopurinol (ZYLOPRIM) 100 MG tablet Take 1 tablet (100 mg total) by mouth daily. FOR GOUT PREVENTION, 90 tablet 3   amoxicillin (AMOXIL) 500 MG tablet SMARTSIG:4 Tablet(s) By Mouth     blood glucose meter kit and supplies KIT Dispense based on patient and insurance preference. Use to check blood sugar once daily. Dx: E11.22, N18.30 1 each 12   Cholecalciferol (VITAMIN D3) 5000 units CAPS Take 5,000 Units by mouth daily.     cyanocobalamin 1000 MCG tablet Take 1,000 mcg by mouth daily.     dexamethasone (DECADRON) 4 MG tablet Take 2 tablets the morning before chemotherapy and then 2 tablets daily for 2 days after chemo 30 tablet 1   ezetimibe (ZETIA) 10 MG tablet TAKE 1 TABLET BY MOUTH DAILY FOR CHOLESTEROL 90 tablet 3  gabapentin (NEURONTIN) 300 MG capsule Take 1 capsule 4 x  /day for Pain 360 capsule 3   glimepiride (AMARYL) 1 MG tablet Take 1 tab by mouth in the morning if fasting is  running 150+. Please do not take this medication if you are ill or not eating as this can cause a low blood sugar. (Patient taking differently: Take 1 mg by mouth See admin instructions. Take 1 tab by mouth daily as needed if fasting is running 150+. Please do not take this medication if you are ill or not eating as this can cause a low blood sugar.) 90 tablet 1   glucose blood test strip Use to test blood sugar once daily. Dx: E11.22, N18.30 100 each 12   hydrochlorothiazide (HYDRODIURIL) 25 MG tablet Take 1 tablet Daily for BP & Fluid Retention                                        /                          TAKE                           BY                   MOUTH 90 tablet 3   Lancets MISC Use to test blood sugar once daily. Dx: E11.22, N18.30 200 each 11   lidocaine-prilocaine (EMLA) cream Apply 1 application. topically daily as needed (for port access). 30 g 3   loratadine (CLARITIN) 10 MG tablet Take 10 mg by mouth daily as needed for allergies.     Magnesium 250 MG TABS Take by mouth daily.     metoprolol succinate (TOPROL-XL) 25 MG 24 hr tablet Take  1 tablet  Daily  for BP       /TAKE 1 TABLET BY MOUTH DAILY FOR BLOOD PRESSURE (Patient taking differently: Take 25 mg by mouth daily.) 90 tablet 3   ondansetron (ZOFRAN) 8 MG tablet Take 1 tablet (8 mg total) by mouth every 8 (eight) hours as needed for refractory nausea / vomiting. 30 tablet 1   PFIZER-BIONT COVID-19 VAC-TRIS SUSP injection      polyethylene glycol (MIRALAX) 17 g packet Take 17 g by mouth 2 (two) times daily. 17 grams in 6 oz of favorite drink twice a day until bowel movement.  LAXITIVE.  Restart if two days since last bowel movement 14 packet 0   rosuvastatin (CRESTOR) 5 MG tablet TAKE 1 TABLET BY MOUTH IN THE EVENING THREE TIMES WEEKLY FOR CHOLESTEROL 36 tablet 3   trolamine salicylate (ASPERCREME) 10 % cream Apply 1 application topically as needed for muscle pain (Use if you still feel tingles in feet in the morning after  taking Gabapentin).     vitamin C (ASCORBIC ACID) 500 MG tablet Take 500 mg by mouth every evening.      No current facility-administered medications for this visit.    SUMMARY OF ONCOLOGIC HISTORY: Oncology History Overview Note  HRD positive Progressed and intolerant to olaparib Progressed on carboplatin, taxol and gemzar   History of left breast cancer  02/26/2016 Initial Diagnosis   Left breast biopsy 11:00 position: invasive ductal carcinoma with DCIS, ER 90%, PR 10%, HER-2 negative, Ki-67 30%, grade 2, 2.2 cm palpable lesion  T2 N0 stage II a clinical stage   03/18/2016 Surgery   Left lumpectomy: Invasive ductal carcinoma, grade 2, 6.3 cm, with high-grade DCIS, margins negative, 0/4 lymph nodes negative, ER 90%, via 10%, HER-2 negative ratio 0.97, Ki-67 30%, T3 N0 stage IIB   03/25/2016 Procedure   Genetic testing is negative for pathogenic mutations within any of the 20 Genes on the breast/ovarian cancer panel   04/04/2016 Oncotype testing   Oncotype DX recurrence score 37, 25% 10 year distant risk of recurrence   04/17/2016 - 07/31/2016 Chemotherapy   Adjuvant chemotherapy with dose dense Adriamycin and Cytoxan followed by Abraxane weekly 8 ( discontinued due to neuropathy)   09/01/2016 - 09/26/2016 Radiation Therapy   Adj XRT 1) Left breast: 42.5 Gy in 17 fractions. 2) Left breast boost: 7.5 Gy in 3 fractions.   12/02/2016 -  Anti-estrogen oral therapy   Anastrozole 1 mg daily   09/05/2021 - 05/21/2022 Chemotherapy   Patient is on Treatment Plan : OVARIAN RECURRENT 3RD LINE Carboplatin D1 / Gemcitabine D1,8 (4/800) q21d     07/08/2022 -  Chemotherapy   Patient is on Treatment Plan : ovarian Docetaxel q21d     Ovarian cancer ([place])  04/03/2020 Imaging   US pelvis Complex cystic and solid mass in LEFT adnexa 12.5 cm diameter question cystic ovarian neoplasm; recommend correlation with serum tumor markers and further evaluation by MR imaging with and without contrast.     04/04/2020  Imaging   US venous Doppler No evidence of deep venous thrombosis in the right lower extremity. Left common femoral vein also patent.   04/15/2020 Imaging   MRI pelvis 1. Large complex solid and cystic mass arising from the right adnexa measuring 10.8 by 11.5 by 11.1 cm. This has an aggressive appearance with extensive enhancing mural soft tissue components. Findings are highly suspicious for malignant ovarian neoplasm. 2. Extensive bilateral retroperitoneal and bilateral iliac adenopathy compatible with metastatic disease. 3. Signs of extensive peritoneal carcinomatosis including ascites, enhancement, thickening and nodularity of the peritoneal reflections, omental caking and bulky peritoneal nodularity. 4. Suspected serosal involvement of the dome of bladder with loss of normal fat plane. Cannot rule out mural invasion by tumor.   04/17/2020 Tumor Marker   Patient's tumor was tested for the following markers: CA-125 Results of the tumor marker test revealed 1762.   04/20/2020 Cancer Staging   Staging form: Ovary, Fallopian Tube, and Primary Peritoneal Carcinoma, AJCC 8th Edition - Clinical: FIGO Stage IIIC (cT3, cN1, cM0) - Signed by Heath Lark, MD on 04/20/2020   04/23/2020 Imaging   1. Redemonstrated dominant mixed solid and cystic mass arising from the vicinity of the right ovary measuring at least 11.5 x 10.7 cm, not significantly changed compared to prior MR, and consistent with primary ovarian malignancy.  2. Numerous bulky retroperitoneal, bilateral iliac, and pelvic sidewall lymph nodes. 3. Moderate volume ascites throughout the abdomen and pelvis with subtle thickening and nodularity throughout the peritoneum, and extensive bulky nodular metastatic disease of the omentum. 4. Constellation of findings is consistent with advanced nodal and peritoneal metastatic disease. 5. There are prominent subcentimeter epicardial lymph nodes, nonspecific although suspicious for nodal metastatic  disease. No definite nodal metastatic disease in the chest. Attention on follow-up. 6. There are multiple small subpleural nodules at the right lung base overlying the diaphragm, measuring up to 7 mm. These are generally nonspecific and less favored to represent pulmonary metastatic disease given distribution. Attention on follow-up. 7. Somewhat coarse contour of the liver,  suggestive of cirrhosis, although out without overt morphologic stigmata. 8. Aortic Atherosclerosis (ICD10-I70.0).     04/24/2020 Procedure   Successful placement of a right internal jugular approach power injectable Port-A-Cath. The catheter is ready for immediate use.     05/02/2020 Tumor Marker   Patient's tumor was tested for the following markers: CA-125 Results of the tumor marker test revealed 1921   05/03/2020 - 12/12/2020 Chemotherapy   The patient had carboplatin and taxol for chemotherapy treatment.     05/08/2020 Procedure   Successful ultrasound-guided paracentesis yielding 2.6 liters of peritoneal fluid.   05/16/2020 Procedure   Successful ultrasound-guided paracentesis yielding 3.2 liters of peritoneal fluid   05/25/2020 Procedure   Successful ultrasound-guided paracentesis yielding 3 liters of peritoneal fluid.     06/01/2020 Procedure   Successful ultrasound-guided therapeutic paracentesis yielding 1.7 liters of peritoneal fluid.   06/14/2020 Tumor Marker   Patient's tumor was tested for the following markers: CA-125 Results of the tumor marker test revealed 1309   06/20/2020 Imaging   1. Dominant mixed cystic and solid mass in the central pelvis is stable in the interval. 2. Clear interval decrease in retroperitoneal and pelvic sidewall lymphadenopathy. The bulky omental disease has also clearly decreased in the interval. 3. Interval decrease in ascites. 4. Stable 8 mm subpleural nodule along the diaphragm. 5. Aortic Atherosclerosis (ICD10-I70.0).   07/10/2020 Tumor Marker   Patient's tumor was tested  for the following markers: CA-125 Results of the tumor marker test revealed 220   08/03/2020 Tumor Marker   Patient's tumor was tested for the following markers: CA-125 Results of the tumor marker test revealed 53.3.   09/03/2020 Tumor Marker   Patient's tumor was tested for the following markers: CA-125 Results of the tumor marker test revealed 29.7   09/20/2020 Imaging   1. Substantial improvement. The right ovarian cystic and solid mass is half the volume that it measured on 06/20/2020. Similar reduction in the bulk of the omental caking of tumor. Prior ascites is resolved and the retroperitoneal adenopathy is markedly improved. 2. Other imaging findings of potential clinical significance: Stable pleural-based nodularity along the right hemidiaphragm measuring about 1.1 by 0.7 by 0.3 cm. Stable hypodense lesion of the right kidney upper pole, probably a cyst although the configuration of this lesion makes it difficult to obtain accurate density measurements. Left ovarian cyst, stable. Chondrocalcinosis involving the acetabular labra. Mild lumbar spondylosis and degenerative disc disease. 3. Aortic atherosclerosis.     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 (stage ypT3c), predominantly viable with focal fibrosis and hemosiderin laden macrophages (possible mild treatment effect)  B: Ovary and fallopian tube, right, salpingo-oophorectomy - High grade serous carcinoma involving right ovary and fallopian tube with surface involvement, size up to 8 cm  - Areas of necrosis and hemosiderin laden macrophages suggestive of treatment effect - 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 - See synoptic report and comment  C: Ovary  and fallopian tube, left, salpingo-oophorectomy - High grade serous carcinoma involving the left fallopian tube, size up to 0.5 cm - Ovary with no definite involvement by carcinoma identified - Nodular area of endometriosis and peritoneal inclusion cyst also present - See synoptic report and comment   Procedure:    Bilateral salpingo-oophorectomy    Procedure:    Omentectomy  Specimen Integrity of Right Ovary:    Intact with surface involvement by tumor    Specimen Integrity of Left Ovary:    Capsule intact   TUMOR Tumor Site:    Right fallopian tube: favor as primary site; also involves right ovary and left fallopian tube  Histologic Type:    Serous carcinoma  Histologic Grade:    High grade  Tumor Size:    Greatest Dimension (Centimeters): in fallopian tube: 1.0 cm; in ovary: 8.0 cm Ovarian Surface Involvement:    Present    Laterality:    Right  Fallopian Tube Surface Involvement:    Present    Laterality:    Bilateral  Other Tissue / Organ Involvement:    Right ovary  Other Tissue / Organ Involvement:    Right fallopian tube  Other Tissue / Organ Involvement:    Left fallopian tube  Other Tissue / Organ Involvement:    Omentum  Largest Extrapelvic Peritoneal Focus:    Macroscopic (greater than 2 cm)  Peritoneal / Ascitic Fluid:    Not submitted / unknown  Pleural Fluid:    Not submitted / unknown  Treatment Effect:    No definite or minimal response identified (chemotherapy response score [CRS] 1)   LYMPH NODES Regional Lymph Nodes:    No lymph nodes submitted or found   PATHOLOGIC STAGE CLASSIFICATION (pTNM, AJCC 8th Edition) TNM Descriptors:    y (post-treatment)  Primary Tumor (pT):    pT3c  Regional Lymph Nodes (pN):    pNX   FIGO STAGE FIGO Stage:    IIIC   ADDITIONAL FINDINGS Additional Findings:    Serous tubal intraepithelial carcinoma (STIC)  Additional Findings:    Left ovary with no definite carcinoma identified, left-sided nodule of endometriosis and  peritoneal inclusion cyst    10/18/2020 - 10/20/2020 Hospital Admission   She was admitted to [facility] for interval debulking surgery   11/19/2020 Tumor Marker   Patient's tumor was tested for the following markers: CA-125. Results of the tumor marker test revealed 38.3   12/12/2020 Tumor Marker   Patient's tumor was tested for the following markers CA-125. Results of the tumor marker test revealed 42.3.   01/02/2021 Tumor Marker   Patient's tumor was tested for the following markers: CA-125. Results of the tumor marker test revealed 37.3   01/29/2021 Tumor Marker   Patient's tumor was tested for the following markers: CA-125 Results of the tumor marker test revealed 44.3   01/29/2021 Imaging   1. Interval increase in loculated appearing ascites in the low central abdomen and pelvis. 2. There is extensive peritoneal nodularity surrounding this fluid, the nodularity itself not appreciably changed in appearance compared to prior examination. 3. Additional peritoneal nodularity and/or mesenteric lymph nodes are unchanged. 4. Interval decrease in size of a low-attenuation nodule overlying the right hemidiaphragm, now measuring 1.2 cm, previously 1.8 cm when measured similarly. Findings are consistent with treatment response of a metastatic nodule. No other evidence of intrathoracic metastatic disease. 5. Status post hysterectomy, oophorectomy, and omentectomy.   01/31/2021 - 08/26/2021 Chemotherapy   She is started on Olaparib       03/04/2021 Tumor Marker   Patient's tumor was tested for the following markers: CA-125 Results of the tumor marker test revealed 39.7   04/01/2021 Tumor Marker   Patient's tumor was tested for the following markers: CA-125 Results of the tumor marker test revealed 32.3   05/10/2021 Imaging   1. No significant change in peritoneal carcinomatosis  since previous study of 3 months ago, primarily in the pelvis where there is complex fluid and peritoneal  nodularity. 2. No evidence of solid visceral organ metastasis. No evidence of bowel or ureteral obstruction. 3.  Aortic Atherosclerosis (ICD10-I70.0).   06/13/2021 Tumor Marker   Patient's tumor was tested for the following markers: CA-125. Results of the tumor marker test revealed 48.3.   07/29/2021 Tumor Marker   Patient's tumor was tested for the following markers: CA-125. Results of the tumor marker test revealed 54.   08/25/2021 Imaging   1. There is extensive peritoneal soft tissue thickening and nodularity, particularly about the low right hemipelvis, which is similar to prior examination. Some small peritoneal nodules throughout the abdomen and pelvis are slightly enlarged, other nodules are unchanged. 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 2. No significant change in a subpleural nodule overlying the right hemidiaphragm. 3. Unchanged, loculated small volume pelvic ascites. 4. Status post hysterectomy, oophorectomy, and omentectomy. 5. Coronary artery disease.   Aortic Atherosclerosis (ICD10-I70.0).     08/26/2021 Tumor Marker   Patient's tumor was tested for the following markers: CA-125. Results of the tumor marker test revealed 56.   09/04/2021 Tumor Marker   Patient's tumor was tested for the following markers: CA-125. Results of the tumor marker test revealed 55.4.   09/05/2021 - 05/21/2022 Chemotherapy   Patient is on Treatment Plan : OVARIAN RECURRENT 3RD LINE Carboplatin D1 / Gemcitabine D1,8 (4/800) q21d     10/07/2021 Tumor Marker   Patient's tumor was tested for the following markers: CA-125. Results of the tumor marker test revealed 51.9.   11/05/2021 Tumor Marker   Patient's tumor was tested for the following markers: CA-125. Results of the tumor marker test revealed 45.6.   11/27/2021 Imaging   1. Overall stable to slightly improved ill-defined soft tissue density throughout the pelvis. 2. Slight interval  decrease in size of the omental lesions. 3. Stable free pelvic fluid. 4. New surgical changes from a right hip replacement. 5. New nodular and somewhat triangular density at the left lung base is likely an area of atelectasis. 6. Aortic atherosclerosis.   12/24/2021 Tumor Marker   Patient's tumor was tested for the following markers: CA-125. Results of the tumor marker test revealed 39.6.   02/04/2022 Tumor Marker   Patient's tumor was tested for the following markers: CA-125. Results of the tumor marker test revealed 36.3.   03/05/2022 Imaging   1. Similar to mild interval increase in size of right pericolic goiter nodules and omental nodules. 2. Similar appearance of the amorphous soft tissue within the pelvis. There is decreased fluid component   03/06/2022 Tumor Marker   Patient's tumor was tested for the following markers: CA-125. Results of the tumor marker test revealed 56.2.   04/07/2022 Tumor Marker   Patient's tumor was tested for the following markers: CA-125. Results of the tumor marker test revealed 65.5.   05/13/2022 Tumor Marker   Patient's tumor was tested for the following markers: CA-125. Results of the tumor marker test revealed 66.5.   06/12/2022 Imaging   1. Slight increase in prominence of the peritoneal and omental tumor deposits, which are most thickly banded in the lower pelvis. 2. Questionable wall thickening in the descending duodenum, cannot exclude duodenitis. 3. Other imaging findings of potential clinical significance: Bibasilar scarring. Aortic Atherosclerosis (ICD10-I70.0). Cholelithiasis.   07/08/2022 -  Chemotherapy   Patient is on Treatment Plan : ovarian Docetaxel q21d     07/09/2022  Tumor Marker   Patient's tumor was tested for the following markers: CA-125. Results of the tumor marker test revealed 103.Marland Kitchen     PHYSICAL EXAMINATION: ECOG PERFORMANCE STATUS: 1 - Symptomatic but completely ambulatory  Vitals:   07/25/22 1348  BP: (!) 125/56  Pulse:  86  Resp: 18  Temp: 97.8 F (36.6 C)  SpO2: 99%   Filed Weights   07/25/22 1348  Weight: 140 lb 3.2 oz (63.6 kg)    GENERAL:alert, no distress and comfortable SKIN: She has macular rash on both cheeks but no rash elsewhere NEURO: alert & oriented x 3 with fluent speech, no focal motor/sensory deficits  LABORATORY DATA:  I have reviewed the data as listed    Component Value Date/Time   NA 140 07/08/2022 1339   NA 141 11/06/2016 1102   K 3.9 07/08/2022 1339   K 3.9 11/06/2016 1102   CL 105 07/08/2022 1339   CO2 29 07/08/2022 1339   CO2 27 11/06/2016 1102   GLUCOSE 188 (H) 07/08/2022 1339   GLUCOSE 130 11/06/2016 1102   BUN 26 (H) 07/08/2022 1339   BUN 18.0 11/06/2016 1102   CREATININE 1.24 (H) 07/08/2022 1339   CREATININE 1.16 (H) 06/16/2022 1038   CREATININE 1.3 (H) 11/06/2016 1102   CALCIUM 9.8 07/08/2022 1339   CALCIUM 10.2 11/06/2016 1102   PROT 7.6 07/08/2022 1339   PROT 7.1 11/06/2016 1102   ALBUMIN 3.7 07/08/2022 1339   ALBUMIN 3.7 11/06/2016 1102   AST 54 (H) 07/08/2022 1339   AST 52 (H) 11/06/2016 1102   ALT 20 07/08/2022 1339   ALT 58 (H) 11/06/2016 1102   ALKPHOS 121 07/08/2022 1339   ALKPHOS 159 (H) 11/06/2016 1102   BILITOT 0.6 07/08/2022 1339   BILITOT 0.53 11/06/2016 1102   GFRNONAA 43 (L) 07/08/2022 1339   GFRNONAA 34 (L) 02/21/2021 1414   GFRAA 40 (L) 02/21/2021 1414    No results found for: "SPEP", "UPEP"  Lab Results  Component Value Date   WBC 3.4 (L) 07/08/2022   NEUTROABS 2.6 07/08/2022   HGB 10.4 (L) 07/08/2022   HCT 31.5 (L) 07/08/2022   MCV 99.1 07/08/2022   PLT 84 (L) 07/08/2022      Chemistry      Component Value Date/Time   NA 140 07/08/2022 1339   NA 141 11/06/2016 1102   K 3.9 07/08/2022 1339   K 3.9 11/06/2016 1102   CL 105 07/08/2022 1339   CO2 29 07/08/2022 1339   CO2 27 11/06/2016 1102   BUN 26 (H) 07/08/2022 1339   BUN 18.0 11/06/2016 1102   CREATININE 1.24 (H) 07/08/2022 1339   CREATININE 1.16 (H)  06/16/2022 1038   CREATININE 1.3 (H) 11/06/2016 1102      Component Value Date/Time   CALCIUM 9.8 07/08/2022 1339   CALCIUM 10.2 11/06/2016 1102   ALKPHOS 121 07/08/2022 1339   ALKPHOS 159 (H) 11/06/2016 1102   AST 54 (H) 07/08/2022 1339   AST 52 (H) 11/06/2016 1102   ALT 20 07/08/2022 1339   ALT 58 (H) 11/06/2016 1102   BILITOT 0.6 07/08/2022 1339   BILITOT 0.53 11/06/2016 1102

## 2022-07-25 NOTE — Assessment & Plan Note (Signed)
This is likely due to skin sensitivity This is not an allergic reaction and she is reassured

## 2022-07-25 NOTE — Telephone Encounter (Signed)
I can see her at 145 today

## 2022-07-25 NOTE — Telephone Encounter (Signed)
Pt called to this RN and reported she noted upon " going to bed last night at around 11 pm my face across my checks are all red "  " Like I got into my mother rouge and put too much on "  She states upon waking this am- redness is still present.  She has no discomfort on cheeks - no increased warmth nor no other areas on body reddened. She has no breathing changes- and no itching.  Her last steroid dose was 07/10/2022 post her 1st dose of doxetaxol.  She is due for her next cycle 8/29 with expected premed of dexamethasone to start on 8/28.  She denies any new medications or other lifestyle changes that would relate to above.  This note will be forwarded to MD for review for further recommendations.

## 2022-07-28 MED FILL — Dexamethasone Sodium Phosphate Inj 100 MG/10ML: INTRAMUSCULAR | Qty: 1 | Status: AC

## 2022-07-29 ENCOUNTER — Other Ambulatory Visit: Payer: Self-pay

## 2022-07-29 ENCOUNTER — Inpatient Hospital Stay: Payer: PPO

## 2022-07-29 ENCOUNTER — Other Ambulatory Visit: Payer: Self-pay | Admitting: Hematology and Oncology

## 2022-07-29 ENCOUNTER — Inpatient Hospital Stay: Payer: PPO | Admitting: Hematology and Oncology

## 2022-07-29 VITALS — BP 124/66 | HR 74 | Temp 97.8°F | Resp 18 | Ht 64.0 in | Wt 143.0 lb

## 2022-07-29 DIAGNOSIS — Z853 Personal history of malignant neoplasm of breast: Secondary | ICD-10-CM

## 2022-07-29 DIAGNOSIS — D630 Anemia in neoplastic disease: Secondary | ICD-10-CM

## 2022-07-29 DIAGNOSIS — C561 Malignant neoplasm of right ovary: Secondary | ICD-10-CM | POA: Diagnosis not present

## 2022-07-29 DIAGNOSIS — Z95828 Presence of other vascular implants and grafts: Secondary | ICD-10-CM

## 2022-07-29 DIAGNOSIS — C569 Malignant neoplasm of unspecified ovary: Secondary | ICD-10-CM

## 2022-07-29 DIAGNOSIS — Z5111 Encounter for antineoplastic chemotherapy: Secondary | ICD-10-CM | POA: Diagnosis not present

## 2022-07-29 LAB — CBC WITH DIFFERENTIAL (CANCER CENTER ONLY)
Abs Immature Granulocytes: 0.12 10*3/uL — ABNORMAL HIGH (ref 0.00–0.07)
Basophils Absolute: 0 10*3/uL (ref 0.0–0.1)
Basophils Relative: 0 %
Eosinophils Absolute: 0 10*3/uL (ref 0.0–0.5)
Eosinophils Relative: 0 %
HCT: 27.7 % — ABNORMAL LOW (ref 36.0–46.0)
Hemoglobin: 9.4 g/dL — ABNORMAL LOW (ref 12.0–15.0)
Immature Granulocytes: 1 %
Lymphocytes Relative: 7 %
Lymphs Abs: 1.1 10*3/uL (ref 0.7–4.0)
MCH: 32.1 pg (ref 26.0–34.0)
MCHC: 33.9 g/dL (ref 30.0–36.0)
MCV: 94.5 fL (ref 80.0–100.0)
Monocytes Absolute: 1 10*3/uL (ref 0.1–1.0)
Monocytes Relative: 6 %
Neutro Abs: 14.8 10*3/uL — ABNORMAL HIGH (ref 1.7–7.7)
Neutrophils Relative %: 86 %
Platelet Count: 112 10*3/uL — ABNORMAL LOW (ref 150–400)
RBC: 2.93 MIL/uL — ABNORMAL LOW (ref 3.87–5.11)
RDW: 16.2 % — ABNORMAL HIGH (ref 11.5–15.5)
WBC Count: 17.1 10*3/uL — ABNORMAL HIGH (ref 4.0–10.5)
nRBC: 0 % (ref 0.0–0.2)

## 2022-07-29 LAB — CMP (CANCER CENTER ONLY)
ALT: 19 U/L (ref 0–44)
AST: 35 U/L (ref 15–41)
Albumin: 3.3 g/dL — ABNORMAL LOW (ref 3.5–5.0)
Alkaline Phosphatase: 117 U/L (ref 38–126)
Anion gap: 7 (ref 5–15)
BUN: 42 mg/dL — ABNORMAL HIGH (ref 8–23)
CO2: 27 mmol/L (ref 22–32)
Calcium: 9.7 mg/dL (ref 8.9–10.3)
Chloride: 101 mmol/L (ref 98–111)
Creatinine: 1.42 mg/dL — ABNORMAL HIGH (ref 0.44–1.00)
GFR, Estimated: 36 mL/min — ABNORMAL LOW (ref >60)
Glucose, Bld: 208 mg/dL — ABNORMAL HIGH (ref 70–99)
Potassium: 3.5 mmol/L (ref 3.5–5.1)
Sodium: 135 mmol/L (ref 135–145)
Total Bilirubin: 0.6 mg/dL (ref 0.3–1.2)
Total Protein: 6.7 g/dL (ref 6.5–8.1)

## 2022-07-29 MED ORDER — HEPARIN SOD (PORK) LOCK FLUSH 100 UNIT/ML IV SOLN
500.0000 [IU] | Freq: Once | INTRAVENOUS | Status: AC | PRN
  Administered 2022-07-29: 500 [IU]

## 2022-07-29 MED ORDER — SODIUM CHLORIDE 0.9% FLUSH
10.0000 mL | Freq: Once | INTRAVENOUS | Status: AC
  Administered 2022-07-29: 10 mL

## 2022-07-29 MED ORDER — SODIUM CHLORIDE 0.9 % IV SOLN
50.0000 mg/m2 | Freq: Once | INTRAVENOUS | Status: AC
  Administered 2022-07-29: 90 mg via INTRAVENOUS
  Filled 2022-07-29: qty 9

## 2022-07-29 MED ORDER — SODIUM CHLORIDE 0.9% FLUSH
10.0000 mL | INTRAVENOUS | Status: DC | PRN
  Administered 2022-07-29: 10 mL

## 2022-07-29 MED ORDER — SODIUM CHLORIDE 0.9 % IV SOLN: Freq: Once | INTRAVENOUS | Status: AC

## 2022-07-29 MED ORDER — SODIUM CHLORIDE 0.9 % IV SOLN
10.0000 mg | Freq: Once | INTRAVENOUS | Status: AC
  Administered 2022-07-29: 10 mg via INTRAVENOUS
  Filled 2022-07-29: qty 10

## 2022-07-29 NOTE — Patient Instructions (Signed)
[facility]   Discharge Instructions: Thank you for choosing [facility] to provide your oncology and hematology care.   If you have a lab appointment with the [facility], please go directly to the [facility] and check in at the registration area.   Wear comfortable clothing and clothing appropriate for easy access to any Portacath or PICC line.   We strive to give you quality time with your provider. You may need to reschedule your appointment if you arrive late (15 or more minutes).  Arriving late affects you and other patients whose appointments are after yours.  Also, if you miss three or more appointments without notifying the office, you may be dismissed from the clinic at the provider's discretion.      For prescription refill requests, have your pharmacy contact our office and allow 72 hours for refills to be completed.    Today you received the following chemotherapy and/or immunotherapy agents :Docetaxel (Taxotere)       To help prevent nausea and vomiting after your treatment, we encourage you to take your nausea medication as directed.  BELOW ARE SYMPTOMS THAT SHOULD BE REPORTED IMMEDIATELY: *FEVER GREATER THAN 100.4 F (38 C) OR HIGHER *CHILLS OR SWEATING *NAUSEA AND VOMITING THAT IS NOT CONTROLLED WITH YOUR NAUSEA MEDICATION *UNUSUAL SHORTNESS OF BREATH *UNUSUAL BRUISING OR BLEEDING *URINARY PROBLEMS (pain or burning when urinating, or frequent urination) *BOWEL PROBLEMS (unusual diarrhea, constipation, pain near the anus) TENDERNESS IN MOUTH AND THROAT WITH OR WITHOUT PRESENCE OF ULCERS (sore throat, sores in mouth, or a toothache) UNUSUAL RASH, SWELLING OR PAIN  UNUSUAL VAGINAL DISCHARGE OR ITCHING   Items with * indicate a potential emergency and should be followed up as soon as possible or go to the Emergency Department if any problems should occur.  Please show the CHEMOTHERAPY ALERT CARD or IMMUNOTHERAPY ALERT CARD  at check-in to the Emergency Department and triage nurse.  Should you have questions after your visit or need to cancel or reschedule your appointment, please contact [facility]  Dept: [phone]  and follow the prompts.  Office hours are 8:00 a.m. to 4:30 p.m. Monday - Friday. Please note that voicemails left after 4:00 p.m. may not be returned until the following business day.  We are closed weekends and major holidays. You have access to a nurse at all times for urgent questions. Please call the main number to the clinic Dept: [phone] and follow the prompts.   For any non-urgent questions, you may also contact your provider using MyChart. We now offer e-Visits for anyone 68 and older to request care online for non-urgent symptoms. For details visit [email or web address].   Also download the MyChart app! Go to the app store, search "MyChart", open the app, select [facility], and log in with your MyChart username and password.  Masks are optional in the cancer centers. If you would like for your care team to wear a mask while they are taking care of you, please let them know. You may have one support person who is at least 84 years old accompany you for your appointments.

## 2022-07-29 NOTE — Patient Instructions (Signed)
Implanted [facility] Guide An implanted port is a device that is placed under the skin. It is usually placed in the chest. The device may vary based on the need. Implanted ports can be used to give IV medicine, to take blood, or to give fluids. You may have an implanted port if: You need IV medicine that would be irritating to the small veins in your hands or arms. You need IV medicines, such as chemotherapy, for a long period of time. You need IV nutrition for a long period of time. You may have fewer limitations when using a port than you would if you used other types of long-term IVs. You will also likely be able to return to normal activities after your incision heals. An implanted port has two main parts: Reservoir. The reservoir is the part where a needle is inserted to give medicines or draw blood. The reservoir is round. After the port is placed, it appears as a small, raised area under your skin. Catheter. The catheter is a small, thin tube that connects the reservoir to a vein. Medicine that is inserted into the reservoir goes into the catheter and then into the vein. How is my port accessed? To access your port: A numbing cream may be placed on the skin over the port site. Your health care provider will put on a mask and sterile gloves. The skin over your port will be cleaned carefully with a germ-killing soap and allowed to dry. Your health care provider will gently pinch the port and insert a needle into it. Your health care provider will check for a blood return to make sure the port is in the vein and is still working (patent). If your port needs to remain accessed to get medicine continuously (constant infusion), your health care provider will place a clear bandage (dressing) over the needle site. The dressing and needle will need to be changed every week, or as told by your health care provider. What is flushing? Flushing helps keep the port working. Follow instructions from your  health care provider about how and when to flush the port. Ports are usually flushed with saline solution or a medicine called heparin. The need for flushing will depend on how the port is used: If the port is only used from time to time to give medicines or draw blood, the port may need to be flushed: Before and after medicines have been given. Before and after blood has been drawn. As part of routine maintenance. Flushing may be recommended every 4-6 weeks. If a constant infusion is running, the port may not need to be flushed. Throw away any syringes in a disposal container that is meant for sharp items (sharps container). You can buy a sharps container from a pharmacy, or you can make one by using an empty hard plastic bottle with a cover. How long will my port stay implanted? The port can stay in for as long as your health care provider thinks it is needed. When it is time for the port to come out, a surgery will be done to remove it. The surgery will be similar to the procedure that was done to put the port in. Follow these instructions at home: Caring for your port and port site Flush your port as told by your health care provider. If you need an infusion over several days, follow instructions from your health care provider about how to take care of your port site. Make sure you: Change your  dressing as told by your health care provider. Wash your hands with soap and water for at least 20 seconds before and after you change your dressing. If soap and water are not available, use alcohol-based hand sanitizer. Place any used dressings or infusion bags into a plastic bag. Throw that bag in the trash. Keep the dressing that covers the needle clean and dry. Do not get it wet. Do not use scissors or sharp objects near the infusion tubing. Keep any external tubes clamped, unless they are being used. Check your port site every day for signs of infection. Check for: Redness, swelling, or  pain. Fluid or blood. Warmth. Pus or a bad smell. Protect the skin around the port site. Avoid wearing bra straps that rub or irritate the site. Protect the skin around your port from seat belts. Place a soft pad over your chest if needed. Bathe or shower as told by your health care provider. The site may get wet as long as you are not actively receiving an infusion. General instructions  Return to your normal activities as told by your health care provider. Ask your health care provider what activities are safe for you. Carry a medical alert card or wear a medical alert bracelet at all times. This will let health care providers know that you have an implanted port in case of an emergency. Where to find more information American [employer] Society: www.cancer.[place] of Clinical Oncology: www.cancer.net Contact a health care provider if: You have a fever or chills. You have redness, swelling, or pain at the port site. You have fluid or blood coming from your port site. Your incision feels warm to the touch. You have pus or a bad smell coming from the port site. Summary Implanted ports are usually placed in the chest for long-term IV access. Follow instructions from your health care provider about flushing the port and changing bandages (dressings). Take care of the area around your port by avoiding clothing that puts pressure on the area, and by watching for signs of infection. Protect the skin around your port from seat belts. Place a soft pad over your chest if needed. Contact a health care provider if you have a fever or you have redness, swelling, pain, fluid, or a bad smell at the port site. This information is not intended to replace advice given to you by your health care provider. Make sure you discuss any questions you have with your health care provider. Document Revised: 05/21/2021 Document Reviewed: 05/21/2021 Elsevier Patient Education  [place].

## 2022-08-03 ENCOUNTER — Telehealth: Payer: PPO | Admitting: Nurse Practitioner

## 2022-08-03 DIAGNOSIS — R5383 Other fatigue: Secondary | ICD-10-CM

## 2022-08-03 NOTE — Progress Notes (Signed)
Virtual Visit Consent   Kayla Baker, you are scheduled for a virtual visit with a [facility] provider today. Just as with appointments in the office, your consent must be obtained to participate. Your consent will be active for this visit and any virtual visit you may have with one of our providers in the next 365 days. If you have a MyChart account, a copy of this consent can be sent to you electronically.  As this is a virtual visit, video technology does not allow for your provider to perform a traditional examination. This may limit your provider's ability to fully assess your condition. If your provider identifies any concerns that need to be evaluated in person or the need to arrange testing (such as labs, EKG, etc.), we will make arrangements to do so. Although advances in technology are sophisticated, we cannot ensure that it will always work on either your end or our end. If the connection with a video visit is poor, the visit may have to be switched to a telephone visit. With either a video or telephone visit, we are not always able to ensure that we have a secure connection.  By engaging in this virtual visit, you consent to the provision of healthcare and authorize for your insurance to be billed (if applicable) for the services provided during this visit. Depending on your insurance coverage, you may receive a charge related to this service.  I need to obtain your verbal consent now. Are you willing to proceed with your visit today? Kayla Baker has provided verbal consent on 08/03/2022 for a virtual visit (video or telephone). Apolonio Schneiders, FNP  Date: 08/03/2022 5:04 PM  Virtual Visit via Video Note   I, Apolonio Schneiders, connected with  Kayla Baker  ([id number], 26-Oct-1936) on 08/03/22 at  5:00 PM EDT by a video-enabled telemedicine application and verified that I am speaking with the correct person using two identifiers.  Location: Patient: Virtual Visit Location Patient: Home Provider:  Virtual Visit Location Provider: Home Office   I discussed the limitations of evaluation and management by telemedicine and the availability of in person appointments. The patient expressed understanding and agreed to proceed.    History of Present Illness: Kayla Baker is a 86 y.o. who identifies as a female who was assigned female at birth, and is being seen today for follow up after chemo transfusion on 07/29/22.   She had bloodwork performed prior to her transfusion 07/29/22   She has been increasingly fatigued, loss of appetite. She is drinking a good amount, she has also attempted protein shakes but not daily at this point.   She was experiencing constipation last week, used miralax and senna for relief. Last BM was 24-48 hour ago. Still hard and difficult to pass.   She does feel bloating in her stomach today   Next scheduled chemo is 08/19/22  Daughter is with her during visit and helps provide history. She was called over this morning because her Mother was feeling very fatigued and thought she was to the point she needed to go to the hospital, but has perked up throughout the day.   Denies body aches or fever  Most concern today is overall fatigue   Recent Results (from the past 2160 hour(s))  CA 125     Status: Abnormal   Collection Time: 05/12/22  9:08 AM  Result Value Ref Range   Cancer Antigen (CA) 125 66.6 (H) 0.0 - 38.1 U/mL  Comment: (NOTE) Roche Diagnostics Electrochemiluminescence Immunoassay (ECLIA) Values obtained with different assay methods or kits cannot be used interchangeably.  Results cannot be interpreted as absolute evidence of the presence or absence of malignant disease. Performed At: [facility] [place], [place] [id number] Rush Farmer MD [id number]   Magnesium     Status: Abnormal   Collection Time: 05/12/22  9:08 AM  Result Value Ref Range   Magnesium 1.1 (L) 1.7 - 2.4 mg/dL    Comment: Performed at [facility], [place] [address] [place], [place] [address]  CMP ([place] only)     Status: Abnormal   Collection Time: 05/12/22  9:08 AM  Result Value Ref Range   Sodium 142 135 - 145 mmol/L   Potassium 3.2 (L) 3.5 - 5.1 mmol/L   Chloride 107 98 - 111 mmol/L   CO2 30 22 - 32 mmol/L   Glucose, Bld 192 (H) 70 - 99 mg/dL    Comment: Glucose reference range applies only to samples taken after fasting for at least 8 hours.   BUN 24 (H) 8 - 23 mg/dL   Creatinine 1.19 (H) 0.44 - 1.00 mg/dL   Calcium 9.9 8.9 - 10.3 mg/dL   Total Protein 6.5 6.5 - 8.1 g/dL   Albumin 3.5 3.5 - 5.0 g/dL   AST 35 15 - 41 U/L   ALT 15 0 - 44 U/L   Alkaline Phosphatase 103 38 - 126 U/L   Total Bilirubin 0.6 0.3 - 1.2 mg/dL   GFR, Estimated 45 (L) >60 mL/min    Comment: (NOTE) Calculated using the CKD-EPI Creatinine Equation (2021)    Anion gap 5 5 - 15    Comment: Performed at [facility], [place] [address], [place], [place] [address]  CBC with Differential ([place] Only)     Status: Abnormal   Collection Time: 05/12/22  9:08 AM  Result Value Ref Range   WBC Count 4.1 4.0 - 10.5 K/uL   RBC 2.92 (L) 3.87 - 5.11 MIL/uL   Hemoglobin 9.6 (L) 12.0 - 15.0 g/dL   HCT 28.8 (L) 36.0 - 46.0 %   MCV 98.6 80.0 - 100.0 fL   MCH 32.9 26.0 - 34.0 pg   MCHC 33.3 30.0 - 36.0 g/dL   RDW 17.1 (H) 11.5 - 15.5 %   Platelet Count 84 (L) 150 - 400 K/uL   nRBC 0.0 0.0 - 0.2 %   Neutrophils Relative % 67 %   Neutro Abs 2.8 1.7 - 7.7 K/uL   Lymphocytes Relative 20 %   Lymphs Abs 0.8 0.7 - 4.0 K/uL   Monocytes Relative 10 %   Monocytes Absolute 0.4 0.1 - 1.0 K/uL   Eosinophils Relative 1 %   Eosinophils Absolute 0.0 0.0 - 0.5 K/uL   Basophils Relative 0 %   Basophils Absolute 0.0 0.0 - 0.1 K/uL   Immature Granulocytes 2 %   Abs Immature Granulocytes 0.06 0.00 - 0.07 K/uL    Comment: Performed at [facility], [place] [address] [place], [place] [address]   CMP ([place] only)     Status: Abnormal   Collection Time: 05/21/22  9:06 AM  Result Value Ref Range   Sodium 141 135 - 145 mmol/L   Potassium 3.4 (L) 3.5 - 5.1 mmol/L   Chloride 106 98 - 111 mmol/L   CO2 30 22 - 32 mmol/L   Glucose, Bld 165 (H) 70 - 99  mg/dL    Comment: Glucose reference range applies only to samples taken after fasting for at least 8 hours.   BUN 24 (H) 8 - 23 mg/dL   Creatinine 1.25 (H) 0.44 - 1.00 mg/dL   Calcium 9.8 8.9 - 10.3 mg/dL   Total Protein 6.9 6.5 - 8.1 g/dL   Albumin 3.5 3.5 - 5.0 g/dL   AST 36 15 - 41 U/L   ALT 15 0 - 44 U/L   Alkaline Phosphatase 108 38 - 126 U/L   Total Bilirubin 0.6 0.3 - 1.2 mg/dL   GFR, Estimated 43 (L) >60 mL/min    Comment: (NOTE) Calculated using the CKD-EPI Creatinine Equation (2021)    Anion gap 5 5 - 15    Comment: Performed at [facility], [place] [address], [place], [place] [address]  CBC with Differential ([place] Only)     Status: Abnormal   Collection Time: 05/21/22  9:06 AM  Result Value Ref Range   WBC Count 4.5 4.0 - 10.5 K/uL   RBC 2.99 (L) 3.87 - 5.11 MIL/uL   Hemoglobin 9.9 (L) 12.0 - 15.0 g/dL   HCT 29.6 (L) 36.0 - 46.0 %   MCV 99.0 80.0 - 100.0 fL   MCH 33.1 26.0 - 34.0 pg   MCHC 33.4 30.0 - 36.0 g/dL   RDW 17.1 (H) 11.5 - 15.5 %   Platelet Count 90 (L) 150 - 400 K/uL   nRBC 0.0 0.0 - 0.2 %   Neutrophils Relative % 64 %   Neutro Abs 2.9 1.7 - 7.7 K/uL   Lymphocytes Relative 23 %   Lymphs Abs 1.1 0.7 - 4.0 K/uL   Monocytes Relative 11 %   Monocytes Absolute 0.5 0.1 - 1.0 K/uL   Eosinophils Relative 2 %   Eosinophils Absolute 0.1 0.0 - 0.5 K/uL   Basophils Relative 0 %   Basophils Absolute 0.0 0.0 - 0.1 K/uL   Immature Granulocytes 0 %   Abs Immature Granulocytes 0.01 0.00 - 0.07 K/uL    Comment: Performed at [facility], [place] [address], [place], [place] [address]  Culture, Urine     Status: Abnormal   Collection Time: 05/26/22  1:51  PM   Specimen: Urine, Clean Catch  Result Value Ref Range   Specimen Description      URINE, CLEAN CATCH Performed at [facility], [place] [address], [place], [place] [address]    Special Requests      NONE Performed at [facility], [place] [address], [place], [place] [address]    Culture (A)     <10,000 COLONIES/mL INSIGNIFICANT GROWTH Performed at [place] [address], [place], [place] [address]    Report Status 05/27/2022 FINAL   Urinalysis, Complete w Microscopic     Status: None   Collection Time: 05/26/22  1:51 PM  Result Value Ref Range   Color, Urine YELLOW YELLOW   APPearance CLEAR CLEAR   Specific Gravity, Urine 1.014 1.005 - 1.030   pH 6.0 5.0 - 8.0   Glucose, UA NEGATIVE NEGATIVE mg/dL   Hgb urine dipstick NEGATIVE NEGATIVE   Bilirubin Urine NEGATIVE NEGATIVE   Ketones, ur NEGATIVE NEGATIVE mg/dL   Protein, ur NEGATIVE NEGATIVE mg/dL   Nitrite NEGATIVE NEGATIVE   Leukocytes,Ua NEGATIVE NEGATIVE   RBC / HPF 0-5 0 - 5 RBC/hpf   WBC, UA 0-5 0 - 5 WBC/hpf   Bacteria, UA NONE  SEEN NONE SEEN    Comment: Performed at [place] [address] [place], [place] [address]  CBC with Differential/Platelet     Status: Abnormal   Collection Time: 06/16/22 10:38 AM  Result Value Ref Range   WBC 3.3 (L) 3.8 - 10.8 Thousand/uL   RBC 3.05 (L) 3.80 - 5.10 Million/uL   Hemoglobin 10.0 (L) 11.7 - 15.5 g/dL   HCT 31.6 (L) 35.0 - 45.0 %   MCV 103.6 (H) 80.0 - 100.0 fL   MCH 32.8 27.0 - 33.0 pg   MCHC 31.6 (L) 32.0 - 36.0 g/dL   RDW 16.8 (H) 11.0 - 15.0 %   Platelets 111 (L) 140 - 400 Thousand/uL   MPV 11.7 7.5 - 12.5 fL   Neutro Abs 1,462 (L) 1,500 - 7,800 cells/uL   Lymphs Abs 1,129 850 - 3,900 cells/uL   Absolute Monocytes 650 200 - 950 cells/uL   Eosinophils Absolute 30 15 - 500 cells/uL   Basophils Absolute 30 0 - 200 cells/uL   Neutrophils Relative % 44.3 %   Total Lymphocyte 34.2 %    Monocytes Relative 19.7 %   Eosinophils Relative 0.9 %   Basophils Relative 0.9 %  COMPLETE METABOLIC PANEL WITH GFR     Status: Abnormal   Collection Time: 06/16/22 10:38 AM  Result Value Ref Range   Glucose, Bld 100 (H) 65 - 99 mg/dL    Comment: .            Fasting reference interval . For someone without known diabetes, a glucose value between 100 and 125 mg/dL is consistent with prediabetes and should be confirmed with a follow-up test. .    BUN 21 7 - 25 mg/dL   Creat 1.16 (H) 0.60 - 0.95 mg/dL   eGFR 46 (L) > OR = 60 mL/min/1.53m    Comment: The eGFR is based on the CKD-EPI 2021 equation. To calculate  the new eGFR from a previous Creatinine or Cystatin C result, go to https://www.kidney.org/professionals/ kdoqi/gfr%5Fcalculator    BUN/Creatinine Ratio 18 6 - 22 (calc)   Sodium 144 135 - 146 mmol/L   Potassium 4.1 3.5 - 5.3 mmol/L   Chloride 106 98 - 110 mmol/L   CO2 31 20 - 32 mmol/L   Calcium 9.5 8.6 - 10.4 mg/dL   Total Protein 6.6 6.1 - 8.1 g/dL   Albumin 3.5 (L) 3.6 - 5.1 g/dL   Globulin 3.1 1.9 - 3.7 g/dL (calc)   AG Ratio 1.1 1.0 - 2.5 (calc)   Total Bilirubin 0.6 0.2 - 1.2 mg/dL   Alkaline phosphatase (APISO) 117 37 - 153 U/L   AST 41 (H) 10 - 35 U/L   ALT 19 6 - 29 U/L  Hemoglobin A1c     Status: Abnormal   Collection Time: 06/16/22 10:38 AM  Result Value Ref Range   Hgb A1c MFr Bld 6.2 (H) <5.7 % of total Hgb    Comment: For someone without known diabetes, a hemoglobin  A1c value between 5.7% and 6.4% is consistent with prediabetes and should be confirmed with a  follow-up test. . For someone with known diabetes, a value <7% indicates that their diabetes is well controlled. A1c targets should be individualized based on duration of diabetes, age, comorbid conditions, and other considerations. . This assay result is consistent with an increased risk of diabetes. . Currently, no consensus exists regarding use of hemoglobin A1c for diagnosis of  diabetes for children. .    Mean  Plasma Glucose 131 mg/dL   eAG (mmol/L) 7.3 mmol/L  Magnesium     Status: Abnormal   Collection Time: 06/16/22 10:38 AM  Result Value Ref Range   Magnesium 1.4 (L) 1.5 - 2.5 mg/dL  CMP ([facility] only)     Status: Abnormal   Collection Time: 07/08/22  1:39 PM  Result Value Ref Range   Sodium 140 135 - 145 mmol/L   Potassium 3.9 3.5 - 5.1 mmol/L   Chloride 105 98 - 111 mmol/L   CO2 29 22 - 32 mmol/L   Glucose, Bld 188 (H) 70 - 99 mg/dL    Comment: Glucose reference range applies only to samples taken after fasting for at least 8 hours.   BUN 26 (H) 8 - 23 mg/dL   Creatinine 1.24 (H) 0.44 - 1.00 mg/dL   Calcium 9.8 8.9 - 10.3 mg/dL   Total Protein 7.6 6.5 - 8.1 g/dL   Albumin 3.7 3.5 - 5.0 g/dL   AST 54 (H) 15 - 41 U/L   ALT 20 0 - 44 U/L   Alkaline Phosphatase 121 38 - 126 U/L   Total Bilirubin 0.6 0.3 - 1.2 mg/dL   GFR, Estimated 43 (L) >60 mL/min    Comment: (NOTE) Calculated using the CKD-EPI Creatinine Equation (2021)    Anion gap 6 5 - 15    Comment: Performed at [facility], [place] [address] [place], [place] [address]  CBC with Differential ([place] Only)     Status: Abnormal   Collection Time: 07/08/22  1:39 PM  Result Value Ref Range   WBC Count 3.4 (L) 4.0 - 10.5 K/uL   RBC 3.18 (L) 3.87 - 5.11 MIL/uL   Hemoglobin 10.4 (L) 12.0 - 15.0 g/dL   HCT 31.5 (L) 36.0 - 46.0 %   MCV 99.1 80.0 - 100.0 fL   MCH 32.7 26.0 - 34.0 pg   MCHC 33.0 30.0 - 36.0 g/dL   RDW 16.6 (H) 11.5 - 15.5 %   Platelet Count 84 (L) 150 - 400 K/uL   nRBC 0.0 0.0 - 0.2 %   Neutrophils Relative % 75 %   Neutro Abs 2.6 1.7 - 7.7 K/uL   Lymphocytes Relative 18 %   Lymphs Abs 0.6 (L) 0.7 - 4.0 K/uL   Monocytes Relative 4 %   Monocytes Absolute 0.1 0.1 - 1.0 K/uL   Eosinophils Relative 1 %   Eosinophils Absolute 0.0 0.0 - 0.5 K/uL   Basophils Relative 1 %   Basophils Absolute 0.0 0.0 - 0.1 K/uL   Immature Granulocytes 1 %    Abs Immature Granulocytes 0.03 0.00 - 0.07 K/uL    Comment: Performed at [facility], [address], [place], [place] [address]  CA 125     Status: Abnormal   Collection Time: 07/08/22  1:39 PM  Result Value Ref Range   Cancer Antigen (CA) 125 103.0 (H) 0.0 - 38.1 U/mL    Comment: (NOTE) Roche Diagnostics Electrochemiluminescence Immunoassay (ECLIA) Values obtained with different assay methods or kits cannot be used interchangeably.  Results cannot be interpreted as absolute evidence of the presence or absence of malignant disease. Performed At: [facility] [place], [place] [id number] Rush Farmer MD ZH:[id number]   [place] ([place] only)     Status: Abnormal   Collection Time: 07/29/22  9:53 AM  Result Value Ref Range   Sodium 135 135 - 145 mmol/L   Potassium 3.5 3.5 -  5.1 mmol/L   Chloride 101 98 - 111 mmol/L   CO2 27 22 - 32 mmol/L   Glucose, Bld 208 (H) 70 - 99 mg/dL    Comment: Glucose reference range applies only to samples taken after fasting for at least 8 hours.   BUN 42 (H) 8 - 23 mg/dL   Creatinine 1.42 (H) 0.44 - 1.00 mg/dL   Calcium 9.7 8.9 - 10.3 mg/dL   Total Protein 6.7 6.5 - 8.1 g/dL   Albumin 3.3 (L) 3.5 - 5.0 g/dL   AST 35 15 - 41 U/L   ALT 19 0 - 44 U/L   Alkaline Phosphatase 117 38 - 126 U/L   Total Bilirubin 0.6 0.3 - 1.2 mg/dL   GFR, Estimated 36 (L) >60 mL/min    Comment: (NOTE) Calculated using the CKD-EPI Creatinine Equation (2021)    Anion gap 7 5 - 15    Comment: Performed at [facility], [place] [address] [place], [place] [address]  CBC with Differential ([place] Only)     Status: Abnormal   Collection Time: 07/29/22  9:53 AM  Result Value Ref Range   WBC Count 17.1 (H) 4.0 - 10.5 K/uL   RBC 2.93 (L) 3.87 - 5.11 MIL/uL   Hemoglobin 9.4 (L) 12.0 - 15.0 g/dL   HCT 27.7 (L) 36.0 - 46.0 %   MCV 94.5 80.0 - 100.0 fL   MCH 32.1 26.0 - 34.0 pg   MCHC 33.9 30.0 -  36.0 g/dL   RDW 16.2 (H) 11.5 - 15.5 %   Platelet Count 112 (L) 150 - 400 K/uL   nRBC 0.0 0.0 - 0.2 %   Neutrophils Relative % 86 %   Neutro Abs 14.8 (H) 1.7 - 7.7 K/uL   Lymphocytes Relative 7 %   Lymphs Abs 1.1 0.7 - 4.0 K/uL   Monocytes Relative 6 %   Monocytes Absolute 1.0 0.1 - 1.0 K/uL   Eosinophils Relative 0 %   Eosinophils Absolute 0.0 0.0 - 0.5 K/uL   Basophils Relative 0 %   Basophils Absolute 0.0 0.0 - 0.1 K/uL   Immature Granulocytes 1 %   Abs Immature Granulocytes 0.12 (H) 0.00 - 0.07 K/uL    Comment: Performed at [facility], [place] [address] [place], [place] [address]     Problems:  Patient Active Problem List   Diagnosis Date Noted   Facial rash 07/25/2022   Diarrhea 05/12/2022   Hypomagnesemia 02/22/2022   Poor dentition 07/26/2021   Nodule of lower lobe of right lung 06/27/2021   Aortic atherosclerosis ([place]) by Abd CT scan on 05/10/2021 06/10/2021   Mild nonproliferative diabetic retinopathy of both eyes without macular edema associated with type 2 diabetes mellitus ([place]) 02/20/2021   Osteopenia 02/20/2021   Genetic testing 09/14/2020   Pancytopenia, acquired ([place]) 05/15/2020   Cancer associated pain 05/15/2020   Anemia in neoplastic disease 05/03/2020   Goals of care, counseling/discussion 04/20/2020   Ovarian cancer ([place]) 04/19/2020   Edema of right lower leg 04/05/2020   CKD stage 3 due to type 2 diabetes mellitus ([place]) 07/05/2018   Adhesive capsulitis of right shoulder 04/27/2018   Postoperative breast asymmetry 04/01/2017   Port catheter in place 09/18/2016   Chemotherapy-induced peripheral neuropathy ([place]) 08/07/2016   History of left breast cancer 02/29/2016   Gout 12/08/2014   Medication management 08/27/2014   Essential hypertension 11/10/2013   Hyperlipidemia associated with type 2 diabetes mellitus ([place]) 11/10/2013   T2_NIDDM  w/CKD3 ([place]) 11/10/2013   Vitamin D deficiency 11/10/2013    Allergies:  Allergies   Allergen Reactions   Keflex [Cephalexin] Swelling    Tongue and throat swells   Meloxicam Swelling   Prednisone Swelling and Other (See Comments)    Can Not take High doses Cannot tolerate high doses Can Not take High doses   Premarin [Estrogens Conjugated] Hives   Trovan [Alatrofloxacin] Other (See Comments)    Unknown reaction   Medications:  Current Outpatient Medications:    acetaminophen (TYLENOL) 325 MG tablet, Take 325 mg by mouth every 6 (six) hours as needed for headache., Disp: , Rfl:    allopurinol (ZYLOPRIM) 100 MG tablet, Take 1 tablet (100 mg total) by mouth daily. FOR GOUT PREVENTION,, Disp: 90 tablet, Rfl: 3   amoxicillin (AMOXIL) 500 MG tablet, SMARTSIG:4 Tablet(s) By Mouth, Disp: , Rfl:    blood glucose meter kit and supplies KIT, Dispense based on patient and insurance preference. Use to check blood sugar once daily. Dx: E11.22, N18.30, Disp: 1 each, Rfl: 12   Cholecalciferol (VITAMIN D3) 5000 units CAPS, Take 5,000 Units by mouth daily., Disp: , Rfl:    cyanocobalamin 1000 MCG tablet, Take 1,000 mcg by mouth daily., Disp: , Rfl:    dexamethasone (DECADRON) 4 MG tablet, Take 2 tablets the morning before chemotherapy and then 2 tablets daily for 2 days after chemo, Disp: 30 tablet, Rfl: 1   ezetimibe (ZETIA) 10 MG tablet, TAKE 1 TABLET BY MOUTH DAILY FOR CHOLESTEROL, Disp: 90 tablet, Rfl: 3   gabapentin (NEURONTIN) 300 MG capsule, Take 1 capsule 4 x  /day for Pain, Disp: 360 capsule, Rfl: 3   glimepiride (AMARYL) 1 MG tablet, Take 1 tab by mouth in the morning if fasting is running 150+. Please do not take this medication if you are ill or not eating as this can cause a low blood sugar. (Patient taking differently: Take 1 mg by mouth See admin instructions. Take 1 tab by mouth daily as needed if fasting is running 150+. Please do not take this medication if you are ill or not eating as this can cause a low blood sugar.), Disp: 90 tablet, Rfl: 1   glucose blood test strip,  Use to test blood sugar once daily. Dx: E11.22, N18.30, Disp: 100 each, Rfl: 12   hydrochlorothiazide (HYDRODIURIL) 25 MG tablet, Take 1 tablet Daily for BP & Fluid Retention                                        /                          TAKE                           BY                   MOUTH, Disp: 90 tablet, Rfl: 3   Lancets MISC, Use to test blood sugar once daily. Dx: E11.22, N18.30, Disp: 200 each, Rfl: 11   lidocaine-prilocaine (EMLA) cream, Apply 1 application. topically daily as needed (for port access)., Disp: 30 g, Rfl: 3   loratadine (CLARITIN) 10 MG tablet, Take 10 mg by mouth daily as needed for allergies., Disp: , Rfl:    Magnesium 250 MG TABS, Take by mouth daily.,  Disp: , Rfl:    metoprolol succinate (TOPROL-XL) 25 MG 24 hr tablet, Take  1 tablet  Daily  for BP       /TAKE 1 TABLET BY MOUTH DAILY FOR BLOOD PRESSURE (Patient taking differently: Take 25 mg by mouth daily.), Disp: 90 tablet, Rfl: 3   ondansetron (ZOFRAN) 8 MG tablet, Take 1 tablet (8 mg total) by mouth every 8 (eight) hours as needed for refractory nausea / vomiting., Disp: 30 tablet, Rfl: 1   PFIZER-BIONT COVID-19 VAC-TRIS SUSP injection, , Disp: , Rfl:    polyethylene glycol (MIRALAX) 17 g packet, Take 17 g by mouth 2 (two) times daily. 17 grams in 6 oz of favorite drink twice a day until bowel movement.  LAXITIVE.  Restart if two days since last bowel movement, Disp: 14 packet, Rfl: 0   rosuvastatin (CRESTOR) 5 MG tablet, TAKE 1 TABLET BY MOUTH IN THE EVENING THREE TIMES WEEKLY FOR CHOLESTEROL, Disp: 36 tablet, Rfl: 3   trolamine salicylate (ASPERCREME) 10 % cream, Apply 1 application topically as needed for muscle pain (Use if you still feel tingles in feet in the morning after taking Gabapentin)., Disp: , Rfl:    vitamin C (ASCORBIC ACID) 500 MG tablet, Take 500 mg by mouth every evening. , Disp: , Rfl:   Observations/Objective: Patient is well-developed, in no acute distress.  Resting comfortably  at home.   Head is normocephalic, atraumatic.  No labored breathing.  Speech is clear and coherent with logical content.  Patient is alert and oriented at baseline.    Assessment and Plan: 1. Other fatigue Discussed with patient daughter and husband need for increased intake, goal to have at least one protein shake daily. Increase fluids to assist with BM and fatigue   F/U with PCP and Oncology for in person evaluation or TH follow up to evaluate for need of bloodwork prior to next infusion       Follow Up Instructions: I discussed the assessment and treatment plan with the patient. The patient was provided an opportunity to ask questions and all were answered. The patient agreed with the plan and demonstrated an understanding of the instructions.  A copy of instructions were sent to the patient via MyChart unless otherwise noted below.    The patient was advised to call back or seek an in-person evaluation if the symptoms worsen or if the condition fails to improve as anticipated.  Time:  I spent 20 minutes with the patient via telehealth technology discussing the above problems/concerns.    Apolonio Schneiders, FNP

## 2022-08-04 ENCOUNTER — Encounter: Payer: Self-pay | Admitting: Hematology and Oncology

## 2022-08-05 ENCOUNTER — Emergency Department (HOSPITAL_COMMUNITY): Payer: PPO

## 2022-08-05 ENCOUNTER — Telehealth: Payer: Self-pay

## 2022-08-05 ENCOUNTER — Inpatient Hospital Stay (HOSPITAL_COMMUNITY)
Admission: EM | Admit: 2022-08-05 | Discharge: 2022-08-09 | DRG: 438 | Disposition: A | Discharge status: Home / Self Care | Payer: PPO | Attending: Internal Medicine | Admitting: Internal Medicine

## 2022-08-05 ENCOUNTER — Other Ambulatory Visit: Payer: Self-pay

## 2022-08-05 ENCOUNTER — Encounter (HOSPITAL_COMMUNITY): Payer: Self-pay

## 2022-08-05 DIAGNOSIS — R531 Weakness: Principal | ICD-10-CM

## 2022-08-05 DIAGNOSIS — R1032 Left lower quadrant pain: Secondary | ICD-10-CM

## 2022-08-05 DIAGNOSIS — M109 Gout, unspecified: Secondary | ICD-10-CM | POA: Diagnosis present

## 2022-08-05 DIAGNOSIS — N1832 Chronic kidney disease, stage 3b: Secondary | ICD-10-CM

## 2022-08-05 DIAGNOSIS — D61818 Other pancytopenia: Secondary | ICD-10-CM

## 2022-08-05 DIAGNOSIS — C786 Secondary malignant neoplasm of retroperitoneum and peritoneum: Secondary | ICD-10-CM | POA: Diagnosis not present

## 2022-08-05 DIAGNOSIS — Z801 Family history of malignant neoplasm of trachea, bronchus and lung: Secondary | ICD-10-CM

## 2022-08-05 DIAGNOSIS — E1122 Type 2 diabetes mellitus with diabetic chronic kidney disease: Secondary | ICD-10-CM | POA: Diagnosis not present

## 2022-08-05 DIAGNOSIS — D701 Agranulocytosis secondary to cancer chemotherapy: Secondary | ICD-10-CM

## 2022-08-05 DIAGNOSIS — I10 Essential (primary) hypertension: Secondary | ICD-10-CM | POA: Diagnosis present

## 2022-08-05 DIAGNOSIS — R109 Unspecified abdominal pain: Secondary | ICD-10-CM

## 2022-08-05 DIAGNOSIS — Z820 Family history of epilepsy and other diseases of the nervous system: Secondary | ICD-10-CM

## 2022-08-05 DIAGNOSIS — I700 Atherosclerosis of aorta: Secondary | ICD-10-CM | POA: Diagnosis not present

## 2022-08-05 DIAGNOSIS — Z90710 Acquired absence of both cervix and uterus: Secondary | ICD-10-CM

## 2022-08-05 DIAGNOSIS — Z923 Personal history of irradiation: Secondary | ICD-10-CM

## 2022-08-05 DIAGNOSIS — E1169 Type 2 diabetes mellitus with other specified complication: Secondary | ICD-10-CM | POA: Diagnosis present

## 2022-08-05 DIAGNOSIS — Z823 Family history of stroke: Secondary | ICD-10-CM

## 2022-08-05 DIAGNOSIS — Z803 Family history of malignant neoplasm of breast: Secondary | ICD-10-CM | POA: Diagnosis not present

## 2022-08-05 DIAGNOSIS — R197 Diarrhea, unspecified: Secondary | ICD-10-CM | POA: Diagnosis not present

## 2022-08-05 DIAGNOSIS — Z881 Allergy status to other antibiotic agents status: Secondary | ICD-10-CM

## 2022-08-05 DIAGNOSIS — K8500 Idiopathic acute pancreatitis without necrosis or infection: Secondary | ICD-10-CM | POA: Diagnosis not present

## 2022-08-05 DIAGNOSIS — Z85828 Personal history of other malignant neoplasm of skin: Secondary | ICD-10-CM

## 2022-08-05 DIAGNOSIS — K859 Acute pancreatitis without necrosis or infection, unspecified: Secondary | ICD-10-CM | POA: Diagnosis not present

## 2022-08-05 DIAGNOSIS — E785 Hyperlipidemia, unspecified: Secondary | ICD-10-CM | POA: Diagnosis not present

## 2022-08-05 DIAGNOSIS — N179 Acute kidney failure, unspecified: Secondary | ICD-10-CM | POA: Diagnosis not present

## 2022-08-05 DIAGNOSIS — I129 Hypertensive chronic kidney disease with stage 1 through stage 4 chronic kidney disease, or unspecified chronic kidney disease: Secondary | ICD-10-CM | POA: Diagnosis not present

## 2022-08-05 DIAGNOSIS — D61810 Antineoplastic chemotherapy induced pancytopenia: Secondary | ICD-10-CM | POA: Diagnosis present

## 2022-08-05 DIAGNOSIS — Z79899 Other long term (current) drug therapy: Secondary | ICD-10-CM | POA: Diagnosis not present

## 2022-08-05 DIAGNOSIS — R5081 Fever presenting with conditions classified elsewhere: Secondary | ICD-10-CM | POA: Diagnosis not present

## 2022-08-05 DIAGNOSIS — K219 Gastro-esophageal reflux disease without esophagitis: Secondary | ICD-10-CM | POA: Diagnosis present

## 2022-08-05 DIAGNOSIS — C569 Malignant neoplasm of unspecified ovary: Secondary | ICD-10-CM | POA: Diagnosis not present

## 2022-08-05 DIAGNOSIS — Z8249 Family history of ischemic heart disease and other diseases of the circulatory system: Secondary | ICD-10-CM

## 2022-08-05 DIAGNOSIS — Z800 Family history of malignant neoplasm of digestive organs: Secondary | ICD-10-CM

## 2022-08-05 DIAGNOSIS — Z7984 Long term (current) use of oral hypoglycemic drugs: Secondary | ICD-10-CM

## 2022-08-05 DIAGNOSIS — N390 Urinary tract infection, site not specified: Secondary | ICD-10-CM | POA: Diagnosis present

## 2022-08-05 DIAGNOSIS — E876 Hypokalemia: Secondary | ICD-10-CM | POA: Diagnosis not present

## 2022-08-05 DIAGNOSIS — Z886 Allergy status to analgesic agent status: Secondary | ICD-10-CM

## 2022-08-05 DIAGNOSIS — Z888 Allergy status to other drugs, medicaments and biological substances status: Secondary | ICD-10-CM | POA: Diagnosis not present

## 2022-08-05 DIAGNOSIS — B9620 Unspecified Escherichia coli [E. coli] as the cause of diseases classified elsewhere: Secondary | ICD-10-CM | POA: Diagnosis present

## 2022-08-05 DIAGNOSIS — E860 Dehydration: Secondary | ICD-10-CM | POA: Diagnosis not present

## 2022-08-05 DIAGNOSIS — T451X5A Adverse effect of antineoplastic and immunosuppressive drugs, initial encounter: Secondary | ICD-10-CM | POA: Diagnosis not present

## 2022-08-05 DIAGNOSIS — D709 Neutropenia, unspecified: Secondary | ICD-10-CM

## 2022-08-05 DIAGNOSIS — Z96641 Presence of right artificial hip joint: Secondary | ICD-10-CM | POA: Diagnosis present

## 2022-08-05 DIAGNOSIS — E119 Type 2 diabetes mellitus without complications: Secondary | ICD-10-CM

## 2022-08-05 DIAGNOSIS — Z833 Family history of diabetes mellitus: Secondary | ICD-10-CM

## 2022-08-05 DIAGNOSIS — N6320 Unspecified lump in the left breast, unspecified quadrant: Secondary | ICD-10-CM | POA: Diagnosis not present

## 2022-08-05 LAB — COMPREHENSIVE METABOLIC PANEL
ALT: 30 U/L (ref 0–44)
AST: 38 U/L (ref 15–41)
Albumin: 2.7 g/dL — ABNORMAL LOW (ref 3.5–5.0)
Alkaline Phosphatase: 75 U/L (ref 38–126)
Anion gap: 11 (ref 5–15)
BUN: 38 mg/dL — ABNORMAL HIGH (ref 8–23)
CO2: 25 mmol/L (ref 22–32)
Calcium: 8.6 mg/dL — ABNORMAL LOW (ref 8.9–10.3)
Chloride: 100 mmol/L (ref 98–111)
Creatinine, Ser: 1.74 mg/dL — ABNORMAL HIGH (ref 0.44–1.00)
GFR, Estimated: 29 mL/min — ABNORMAL LOW (ref >60)
Glucose, Bld: 131 mg/dL — ABNORMAL HIGH (ref 70–99)
Potassium: 3.9 mmol/L (ref 3.5–5.1)
Sodium: 136 mmol/L (ref 135–145)
Total Bilirubin: 1.9 mg/dL — ABNORMAL HIGH (ref 0.3–1.2)
Total Protein: 6.3 g/dL — ABNORMAL LOW (ref 6.5–8.1)

## 2022-08-05 LAB — CBC
HCT: 28.1 % — ABNORMAL LOW (ref 36.0–46.0)
Hemoglobin: 9.3 g/dL — ABNORMAL LOW (ref 12.0–15.0)
MCH: 31.6 pg (ref 26.0–34.0)
MCHC: 33.1 g/dL (ref 30.0–36.0)
MCV: 95.6 fL (ref 80.0–100.0)
Platelets: 54 10*3/uL — ABNORMAL LOW (ref 150–400)
RBC: 2.94 MIL/uL — ABNORMAL LOW (ref 3.87–5.11)
RDW: 16.4 % — ABNORMAL HIGH (ref 11.5–15.5)
WBC: 0.7 10*3/uL — CL (ref 4.0–10.5)
nRBC: 0 % (ref 0.0–0.2)

## 2022-08-05 LAB — URINALYSIS, ROUTINE W REFLEX MICROSCOPIC
Bilirubin Urine: NEGATIVE
Glucose, UA: NEGATIVE mg/dL
Hgb urine dipstick: NEGATIVE
Ketones, ur: NEGATIVE mg/dL
Leukocytes,Ua: NEGATIVE
Nitrite: NEGATIVE
Protein, ur: 100 mg/dL — AB
Specific Gravity, Urine: 1.013 (ref 1.005–1.030)
pH: 6 (ref 5.0–8.0)

## 2022-08-05 LAB — TROPONIN I (HIGH SENSITIVITY)
Troponin I (High Sensitivity): 16 ng/L (ref <18)
Troponin I (High Sensitivity): 16 ng/L (ref <18)

## 2022-08-05 LAB — MAGNESIUM: Magnesium: 1.4 mg/dL — ABNORMAL LOW (ref 1.7–2.4)

## 2022-08-05 LAB — DIFFERENTIAL

## 2022-08-05 LAB — CBG MONITORING, ED: Glucose-Capillary: 114 mg/dL — ABNORMAL HIGH (ref 70–99)

## 2022-08-05 LAB — GLUCOSE, CAPILLARY
Glucose-Capillary: 108 mg/dL — ABNORMAL HIGH (ref 70–99)
Glucose-Capillary: 94 mg/dL (ref 70–99)

## 2022-08-05 LAB — RETICULOCYTES
Immature Retic Fract: 9.3 % (ref 2.3–15.9)
RBC.: 2.9 MIL/uL — ABNORMAL LOW (ref 3.87–5.11)
Retic Ct Pct: <0.4 % — ABNORMAL LOW (ref 0.4–3.1)

## 2022-08-05 LAB — PHOSPHORUS: Phosphorus: 3.2 mg/dL (ref 2.5–4.6)

## 2022-08-05 LAB — LACTATE DEHYDROGENASE: LDH: 174 U/L (ref 98–192)

## 2022-08-05 LAB — URIC ACID: Uric Acid, Serum: 6.4 mg/dL (ref 2.5–7.1)

## 2022-08-05 MED ORDER — OXYCODONE HCL 5 MG PO TABS
5.0000 mg | ORAL_TABLET | ORAL | Status: DC | PRN
  Administered 2022-08-06 – 2022-08-07 (×3): 5 mg via ORAL
  Filled 2022-08-05 (×3): qty 1

## 2022-08-05 MED ORDER — SODIUM CHLORIDE 0.9 % IV SOLN: INTRAVENOUS | Status: DC

## 2022-08-05 MED ORDER — ROSUVASTATIN CALCIUM 5 MG PO TABS
5.0000 mg | ORAL_TABLET | ORAL | Status: DC
  Administered 2022-08-06 – 2022-08-08 (×2): 5 mg via ORAL
  Filled 2022-08-05 (×2): qty 1

## 2022-08-05 MED ORDER — LACTATED RINGERS IV BOLUS
1000.0000 mL | Freq: Once | INTRAVENOUS | Status: AC
  Administered 2022-08-05: 1000 mL via INTRAVENOUS

## 2022-08-05 MED ORDER — ACETAMINOPHEN 325 MG PO TABS
650.0000 mg | ORAL_TABLET | Freq: Four times a day (QID) | ORAL | Status: DC | PRN
  Administered 2022-08-05 – 2022-08-06 (×2): 650 mg via ORAL
  Filled 2022-08-05 (×2): qty 2

## 2022-08-05 MED ORDER — MAGNESIUM SULFATE 2 GM/50ML IV SOLN
2.0000 g | Freq: Once | INTRAVENOUS | Status: AC
  Administered 2022-08-05: 2 g via INTRAVENOUS
  Filled 2022-08-05: qty 50

## 2022-08-05 MED ORDER — INSULIN ASPART 100 UNIT/ML IJ SOLN
0.0000 [IU] | Freq: Three times a day (TID) | INTRAMUSCULAR | Status: DC
  Administered 2022-08-07: 2 [IU] via SUBCUTANEOUS
  Administered 2022-08-08: 1 [IU] via SUBCUTANEOUS

## 2022-08-05 MED ORDER — ACETAMINOPHEN 650 MG RE SUPP: 650.0000 mg | Freq: Four times a day (QID) | RECTAL | Status: DC | PRN

## 2022-08-05 MED ORDER — EZETIMIBE 10 MG PO TABS
10.0000 mg | ORAL_TABLET | Freq: Every day | ORAL | Status: DC
  Administered 2022-08-06 – 2022-08-09 (×4): 10 mg via ORAL
  Filled 2022-08-05 (×4): qty 1

## 2022-08-05 MED ORDER — LIDOCAINE-PRILOCAINE 2.5-2.5 % EX CREA
TOPICAL_CREAM | Freq: Every day | CUTANEOUS | Status: DC | PRN
  Administered 2022-08-05: 1 via TOPICAL
  Filled 2022-08-05: qty 5

## 2022-08-05 MED ORDER — HYDROMORPHONE HCL 1 MG/ML IJ SOLN: 0.5000 mg | INTRAMUSCULAR | Status: DC | PRN

## 2022-08-05 MED ORDER — HYDRALAZINE HCL 20 MG/ML IJ SOLN
10.0000 mg | Freq: Three times a day (TID) | INTRAMUSCULAR | Status: DC | PRN
  Administered 2022-08-05: 10 mg via INTRAVENOUS
  Filled 2022-08-05: qty 1

## 2022-08-05 MED ORDER — PROCHLORPERAZINE EDISYLATE 10 MG/2ML IJ SOLN: 10.0000 mg | Freq: Four times a day (QID) | INTRAMUSCULAR | Status: DC | PRN

## 2022-08-05 MED ORDER — LIDOCAINE-PRILOCAINE 2.5-2.5 % EX CREA: 1.0000 | TOPICAL_CREAM | Freq: Every day | CUTANEOUS | Status: DC | PRN

## 2022-08-05 MED ORDER — ALLOPURINOL 100 MG PO TABS
100.0000 mg | ORAL_TABLET | Freq: Every day | ORAL | Status: DC
  Administered 2022-08-06 – 2022-08-09 (×4): 100 mg via ORAL
  Filled 2022-08-05 (×4): qty 1

## 2022-08-05 MED ORDER — METOPROLOL SUCCINATE ER 25 MG PO TB24
25.0000 mg | ORAL_TABLET | Freq: Every day | ORAL | Status: DC
  Administered 2022-08-06 – 2022-08-09 (×4): 25 mg via ORAL
  Filled 2022-08-05 (×4): qty 1

## 2022-08-05 NOTE — Telephone Encounter (Signed)
-----   Message from Heath Lark, MD sent at 08/05/2022  8:19 AM EDT ----- Pls remind her never send a secure chat for symptoms I can see her at 245 pm, tell her labs are not helpful

## 2022-08-05 NOTE — ED Provider Triage Note (Signed)
Emergency Medicine Provider Triage Evaluation Note  Kayla Baker , a 86 y.o. female  was evaluated in triage.  Pt complains of weakness, decreased PO intake. Last chemo 07/29/22. Upper abdominal and CP over the weekend. No fever. No vomiting. Has been constipated, took meds, now with diarrhea.   Review of Systems  Positive: weakness Negative: fever  Physical Exam  BP 113/62   Pulse (!) 102   Temp 98.9 F (37.2 C) (Oral)   Resp 12   Ht 5' 4"  (1.626 m)   Wt 63.5 kg   SpO2 100%   BMI 24.03 kg/m  Gen:   Awake, no distress   Resp:  Normal effort  MSK:   Moves extremities without difficulty  Other:    Medical Decision Making  Medically screening exam initiated at 9:31 AM.  Appropriate orders placed.  Kayla Baker was informed that the remainder of the evaluation will be completed by another provider, this initial triage assessment does not replace that evaluation, and the importance of remaining in the ED until their evaluation is complete.     Tacy Learn, PA-C 08/05/22 [phone]

## 2022-08-05 NOTE — Progress Notes (Signed)
Angelica Frandsen Vila   DOB:Oct 13, 1936   IP#:[id number]    ASSESSMENT & PLAN:  Acute pancreatitis This is the most likely source of her abdominal pain, loss of appetite and loose stool Recommend conservative approach with IV fluids, bowel rest and pain management I anticipate it would take 2 to 3 days to resolve  Recurrent ovarian cancer She received first dose of Taxotere recently, complicated by facial rash and loss of appetite The chemotherapy itself is not the direct cause of pancreatitis but overall, she is quite weak due to pancreatitis Continue supportive care for now  Acquired pancytopenia Due to recent treatment She has chronic pancytopenia at baseline due to chronic bone marrow suppression from treatment She does not need blood transfusion today but I anticipate it will get worse over the next few days Monitor closely No need G-CSF unless she has fever  Acute on chronic renal failure Likely due to poor oral intake and dehydration as well as recent diarrhea Agree with IV fluid resuscitation  Hypomagnesemia Replace accordingly per primary service  Code Status Full code  Goals of care Resolution of pancreatitis and pancytopenia  Discharge planning I anticipate she will be here 3 to 5 days minimum I will return to check on her on Thursday  All questions were answered. The patient knows to call the clinic with any problems, questions or concerns.   The total time spent in the appointment was 60 minutes encounter with patients including review of chart and various tests results, discussions about plan of care and coordination of care plan  Heath Lark, MD 08/05/2022 3:06 PM  Subjective:  I was notified of her profound weakness that developed over the weekend She has poor appetite and loose stool over the past few days Her oral intake is very poor She has vague abdominal pain in the epigastrium region that comes and goes No recent fever or chills  Objective:  Vitals:    08/05/22 1330 08/05/22 1415  BP: (!) 147/66   Pulse: 92   Resp: 18   Temp:  99.4 F (37.4 C)  SpO2: 95%     No intake or output data in the 24 hours ending 08/05/22 1506  GENERAL:alert, no distress and comfortable.  She is ill-appearing SKIN: skin color, texture, turgor are normal, no rashes or significant lesions EYES: normal, Conjunctiva are pink and non-injected, sclera clear OROPHARYNX:no exudate, no erythema and lips, buccal mucosa, and tongue normal  NECK: supple, thyroid normal size, non-tender, without nodularity LYMPH:  no palpable lymphadenopathy in the cervical, axillary or inguinal LUNGS: clear to auscultation and percussion with normal breathing effort HEART: regular rate & rhythm and no murmurs and no lower extremity edema ABDOMEN:abdomen is distended, mild tenderness on palpation without rebound or guarding Musculoskeletal:no cyanosis of digits and no clubbing  NEURO: alert & oriented x 3 with fluent speech, no focal motor/sensory deficits   Labs:  Recent Labs    07/08/22 1339 07/29/22 0953 08/05/22 1019  NA 140 135 136  K 3.9 3.5 3.9  CL 105 101 100  CO2 29 27 25   GLUCOSE 188* 208* 131*  BUN 26* 42* 38*  CREATININE 1.24* 1.42* 1.74*  CALCIUM 9.8 9.7 8.6*  GFRNONAA 43* 36* 29*  PROT 7.6 6.7 6.3*  ALBUMIN 3.7 3.3* 2.7*  AST 54* 35 38  ALT 20 19 30   ALKPHOS 121 117 75  BILITOT 0.6 0.6 1.9*    Studies: I have personally reviewed her CT imaging CT ABDOMEN PELVIS WO CONTRAST  Result  Date: 08/05/2022 CLINICAL DATA:  Left-sided abdominal pain. History of ovarian cancer. * Tracking Code: BO * EXAM: CT ABDOMEN AND PELVIS WITHOUT CONTRAST TECHNIQUE: Multidetector CT imaging of the abdomen and pelvis was performed following the standard protocol without IV contrast. RADIATION DOSE REDUCTION: This exam was performed according to the departmental dose-optimization program which includes automated exposure control, adjustment of the mA and/or kV according to patient  size and/or use of iterative reconstruction technique. COMPARISON:  06/11/2022 FINDINGS: Lower chest: The lung bases are clear of an acute process. Minimal streaky bibasilar atelectasis. The heart is normal in size. No pericardial effusion. Hepatobiliary: No hepatic lesions are identified without contrast. No intrahepatic biliary dilatation. Small layering gallstones suspected in the gallbladder. No findings for acute cholecystitis. No common bile duct dilatation. Pancreas: Diffuse inflammation/edema in and around the pancreas consistent with diffuse interstitial pancreatitis. Spleen: Stable mild splenomegaly. Adrenals/Urinary Tract: Adrenal glands and kidneys are. Bladder is grossly normal. Stomach/Bowel: The stomach, duodenum, small bowel and colon are grossly no. Vascular/Lymphatic: Moderate atherosclerotic calcification involving the aorta but no aneurysm. Stable scattered mesenteric and retroperitoneal lymph nodes and progressive peritoneal carcinomatosis. Numerous omental and peritoneal implants slightly progressive when compared to the prior study. The soft tissue lesion in the right lower quadrant adjacent to the ascending colon measures 2.6 x 2.1 cm and previously measured 2.5 x 2.0 cm. Multiple pelvic implants are grossly stable to slightly enlarged. Reproductive: Surgically absent. Other: Small amount of free pelvic fluid noted. Musculoskeletal: No significant bony findings. IMPRESSION: 1. 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 2. Slight interval progression of peritoneal carcinomatosis. Aortic Atherosclerosis (ICD10-I70.0). Electronically Signed   By: Marijo Sanes M.D.   On: 08/05/2022 14:20   DG Chest Port 1 View  Result Date: 08/05/2022 CLINICAL DATA:  Weakness with decreased p.o. intake. EXAM: PORTABLE CHEST 1 VIEW COMPARISON:  November 07, 2021 FINDINGS: EKG leads project over the chest. RIGHT-sided Port-A-Cath terminating at the caval to atrial junction. Mild rotation to  the RIGHT. Accounting for this cardiomediastinal contours and hilar structures are normal. Lungs are clear.  No sign of effusion.  No pneumothorax. Post LEFT breast lumpectomy. On limited assessment no acute skeletal findings. IMPRESSION: 1. No acute cardiopulmonary disease. Electronically Signed   By: Zetta Bills M.D.   On: 08/05/2022 09:59

## 2022-08-05 NOTE — Progress Notes (Signed)
One attempt to do nursing admission history. MD at bedside. Unable to complete nursing admission history at this time. Doroteo Bradford BSN, RN-BC Throughput Nurse 08/05/2022 2:57 PM

## 2022-08-05 NOTE — ED Triage Notes (Signed)
Pt reports receiving chemo on 8/29 and then over the weekend developed epigastric pain and weakness. Pt reports pain and weakness after become progressively worse since.

## 2022-08-05 NOTE — Progress Notes (Signed)
Paged Dr. Marylyn Ishihara for temp 102.6. Awaiting response.

## 2022-08-05 NOTE — ED Provider Notes (Signed)
[facility] DEPT Provider Note   CSN: [id number] Arrival date & time: 08/05/22  0910     History  Chief Complaint  Patient presents with   Abdominal Pain   Weakness    Kayla Baker is a 86 y.o. female.  HPI     86 year old female comes in a chief complaint of weakness, reduced oral intake and some abdominal discomfort.  Patient has history of ovarian cancer.  She had her last chemotherapy on 29 August (docetaxel).  Patient alleges that this was only the second time she received that chemotherapy, and that the oncologist is closely monitoring her side effects.  Patient felt fine the day after chemotherapy, but soon after she started having generalized weakness.  She is requiring help/assistance even to get out of the bed now.  She is also having bloating, nausea, diarrhea and loss of appetite.  She had about 4-6 loose bowel movements yesterday, and more on Sunday.  Review of system is negative for any fevers, chills, burning with urination, blood in the urine, bloody stools.  Home Medications Prior to Admission medications   Medication Sig Start Date End Date Taking? Authorizing Provider  acetaminophen (TYLENOL) 325 MG tablet Take 325 mg by mouth every 6 (six) hours as needed for headache.    [provider]  allopurinol (ZYLOPRIM) 100 MG tablet Take 1 tablet (100 mg total) by mouth daily. FOR GOUT PREVENTION, 03/25/22   Liane Comber, NP  amoxicillin (AMOXIL) 500 MG tablet SMARTSIG:4 Tablet(s) By Mouth 03/03/22   [provider]  blood glucose meter kit and supplies KIT Dispense based on patient and insurance preference. Use to check blood sugar once daily. Dx: E11.22, N18.30 02/11/22   Liane Comber, NP  Cholecalciferol (VITAMIN D3) 5000 units CAPS Take 5,000 Units by mouth daily.    [provider]  cyanocobalamin 1000 MCG tablet Take 1,000 mcg by mouth daily.    [provider]  dexamethasone (DECADRON) 4 MG  tablet Take 2 tablets the morning before chemotherapy and then 2 tablets daily for 2 days after chemo 07/01/22   Heath Lark, MD  ezetimibe (ZETIA) 10 MG tablet TAKE 1 TABLET BY MOUTH DAILY FOR CHOLESTEROL 04/24/22   Liane Comber, NP  gabapentin (NEURONTIN) 300 MG capsule Take 1 capsule 4 x  /day for Pain 12/07/21   [name], MD  glimepiride (AMARYL) 1 MG tablet Take 1 tab by mouth in the morning if fasting is running 150+. Please do not take this medication if you are ill or not eating as this can cause a low blood sugar. Patient taking differently: Take 1 mg by mouth See admin instructions. Take 1 tab by mouth daily as needed if fasting is running 150+. Please do not take this medication if you are ill or not eating as this can cause a low blood sugar. 05/04/20   Liane Comber, NP  glucose blood test strip Use to test blood sugar once daily. Dx: E11.22, N18.30 02/11/22   Liane Comber, NP  hydrochlorothiazide (HYDRODIURIL) 25 MG tablet Take 1 tablet Daily for BP & Fluid Retention                                        /                          TAKE  BY                   MOUTH 05/20/22   [name], MD  Lancets MISC Use to test blood sugar once daily. Dx: E11.22, N18.30 02/11/22   Liane Comber, NP  lidocaine-prilocaine (EMLA) cream Apply 1 application. topically daily as needed (for port access). 03/07/22   Heath Lark, MD  loratadine (CLARITIN) 10 MG tablet Take 10 mg by mouth daily as needed for allergies.    [provider]  Magnesium 250 MG TABS Take by mouth daily.    [provider]  metoprolol succinate (TOPROL-XL) 25 MG 24 hr tablet Take  1 tablet  Daily  for BP       /TAKE 1 TABLET BY MOUTH DAILY FOR BLOOD PRESSURE Patient taking differently: Take 25 mg by mouth daily. 08/25/21   [name], MD  ondansetron (ZOFRAN) 8 MG tablet Take 1 tablet (8 mg total) by mouth every 8 (eight) hours as needed for refractory nausea / vomiting.  06/13/22   Heath Lark, MD  PFIZER-BIONT COVID-19 VAC-TRIS SUSP injection  06/11/21   [provider]  polyethylene glycol (MIRALAX) 17 g packet Take 17 g by mouth 2 (two) times daily. 17 grams in 6 oz of favorite drink twice a day until bowel movement.  LAXITIVE.  Restart if two days since last bowel movement 11/10/21   Shepperson, Kirstin, PA-C  rosuvastatin (CRESTOR) 5 MG tablet TAKE 1 TABLET BY MOUTH IN THE EVENING THREE TIMES WEEKLY FOR CHOLESTEROL 03/07/22   Liane Comber, NP  trolamine salicylate (ASPERCREME) 10 % cream Apply 1 application topically as needed for muscle pain (Use if you still feel tingles in feet in the morning after taking Gabapentin).    [provider]  vitamin C (ASCORBIC ACID) 500 MG tablet Take 500 mg by mouth every evening.     [provider]      Allergies    Keflex [cephalexin], Meloxicam, Prednisone, Premarin [estrogens conjugated], and Trovan [alatrofloxacin]    Review of Systems   Review of Systems  All other systems reviewed and are negative.   Physical Exam Updated Vital Signs BP 139/61   Pulse 89   Temp 98.4 F (36.9 C) (Oral)   Resp 15   Ht 5' 4"  (1.626 m)   Wt 63.5 kg   SpO2 97%   BMI 24.03 kg/m  Physical Exam Vitals and nursing note reviewed.  Constitutional:      Appearance: She is ill-appearing. She is not toxic-appearing.  HENT:     Head: Atraumatic.  Cardiovascular:     Rate and Rhythm: Normal rate.  Pulmonary:     Effort: Pulmonary effort is normal.  Abdominal:     General: Abdomen is flat.     Palpations: Abdomen is soft.     Tenderness: There is generalized abdominal tenderness. There is no guarding or rebound.  Musculoskeletal:     Cervical back: Normal range of motion and neck supple.  Skin:    General: Skin is warm and dry.  Neurological:     Mental Status: She is alert and oriented to person, place, and time.     ED Results / Procedures / Treatments   Labs (all labs ordered are listed,  but only abnormal results are displayed) Labs Reviewed  CBC - Abnormal; Notable for the following components:      Result Value   WBC 0.7 (*)    RBC 2.94 (*)    Hemoglobin 9.3 (*)  HCT 28.1 (*)    RDW 16.4 (*)    Platelets 54 (*)    All other components within normal limits  COMPREHENSIVE METABOLIC PANEL - Abnormal; Notable for the following components:   Glucose, Bld 131 (*)    BUN 38 (*)    Creatinine, Ser 1.74 (*)    Calcium 8.6 (*)    Total Protein 6.3 (*)    Albumin 2.7 (*)    Total Bilirubin 1.9 (*)    GFR, Estimated 29 (*)    All other components within normal limits  MAGNESIUM - Abnormal; Notable for the following components:   Magnesium 1.4 (*)    All other components within normal limits  RETICULOCYTES - Abnormal; Notable for the following components:   Retic Ct Pct <0.4 (*)    RBC. 2.90 (*)    All other components within normal limits  CBG MONITORING, ED - Abnormal; Notable for the following components:   Glucose-Capillary 114 (*)    All other components within normal limits  LACTATE DEHYDROGENASE  PHOSPHORUS  URIC ACID  DIFFERENTIAL  URINALYSIS, ROUTINE W REFLEX MICROSCOPIC  TROPONIN I (HIGH SENSITIVITY)  TROPONIN I (HIGH SENSITIVITY)    EKG EKG Interpretation  Date/Time:  Tuesday August 05 2022 09:21:08 EDT Ventricular Rate:  102 PR Interval:  154 QRS Duration: 88 QT Interval:  325 QTC Calculation: 424 R Axis:   40 Text Interpretation: Sinus tachycardia No acute changes normal intervals Nonspecific ST and T wave abnormality No significant change since last tracing Confirmed by Varney Biles ([id number]) on 08/05/2022 11:35:13 AM  Radiology DG Chest Port 1 View  Result Date: 08/05/2022 CLINICAL DATA:  Weakness with decreased p.o. intake. EXAM: PORTABLE CHEST 1 VIEW COMPARISON:  November 07, 2021 FINDINGS: EKG leads project over the chest. RIGHT-sided Port-A-Cath terminating at the caval to atrial junction. Mild rotation to the RIGHT. Accounting for  this cardiomediastinal contours and hilar structures are normal. Lungs are clear.  No sign of effusion.  No pneumothorax. Post LEFT breast lumpectomy. On limited assessment no acute skeletal findings. IMPRESSION: 1. No acute cardiopulmonary disease. Electronically Signed   By: Zetta Bills M.D.   On: 08/05/2022 09:59    Procedures .Critical Care  Performed by: Varney Biles, MD Authorized by: Varney Biles, MD   Critical care provider statement:    Critical care time (minutes):  41   Critical care was necessary to treat or prevent imminent or life-threatening deterioration of the following conditions:  Dehydration (Profound neutropenia)   Critical care was time spent personally by me on the following activities:  Development of treatment plan with patient or surrogate, discussions with consultants, evaluation of patient's response to treatment, examination of patient, ordering and review of laboratory studies, ordering and review of radiographic studies, ordering and performing treatments and interventions, pulse oximetry, re-evaluation of patient's condition and review of old charts     Medications Ordered in ED Medications  magnesium sulfate IVPB 2 g 50 mL (has no administration in time range)  lactated ringers bolus 1,000 mL (1,000 mLs Intravenous New Bag/Given 08/05/22 1147)    ED Course/ Medical Decision Making/ A&P                           Medical Decision Making Amount and/or Complexity of Data Reviewed Labs: ordered.  Risk Prescription drug management. Decision regarding hospitalization.   This patient presents to the ED with chief complaint(s) of generalized weakness, abdominal and chest discomfort, diarrhea and  reduced oral intake with pertinent past medical history of ovarian cancer with last chemotherapy on 8-29.The complaint involves an extensive differential diagnosis and also carries with it a high risk of complications and morbidity.    The differential  diagnosis includes : Chemotherapy side effects, severe electrolyte abnormality, AKI, severe dehydration.  Other possibilities include tumor lysis syndrome. Infection also in the differential diagnosis, but lower based on history and the timing of the chemotherapy.  The initial plan is to get basic labs and hydrate the patient. Anticipate that she will need admission to the hospital given profound weakness. Patient's abdominal exam is overall reassuring.  There is no focal tenderness that is profound and we do not suspect surgical emergency.  Additional history obtained: Additional history obtained from spouse Records reviewed  oncology visits including labs from 8-29 and infusion on 8-29  Independent labs interpretation:  The following labs were independently interpreted: Patient has profound leukopenia and is neutropenic.  She also has low platelet count.  LDH, uric acid are normal.  Patient has hypomagnesemia and elevated creatinine compared to baseline.   Treatment and Reassessment: We will initiate IV fluids.  Patient will also get IV magnesium. Neutropenic precautions initiated.  Patient has no fevers, neutropenia is an incidental finding.  We do not suspect infection based on history alone.  Blood cultures, urine culture have not been sent from the ED.  Consultation: - Consulted or discussed management/test interpretation with external professional: Dr. Simeon Craft surgical oncology.  They will see the patient.  They agree with the admission plan.   Final Clinical Impression(s) / ED Diagnoses Final diagnoses:  Generalized weakness  Chemotherapy adverse reaction, initial encounter  AKI (acute kidney injury) ([place])  Chemotherapy-induced neutropenia ([place])    Rx / DC Orders ED Discharge Orders     None         Varney Biles, MD 08/05/22 1235

## 2022-08-05 NOTE — Telephone Encounter (Signed)
Called and spoke with husband. He said that Kayla Baker is too weak to talk on the phone. She is not doing well and is exhausted. Instructed husband to take her now to [facility] ER to be evaluated. He verbalized understanding.

## 2022-08-05 NOTE — H&P (Signed)
History and Physical    Patient: Kayla Baker RDE:[id number] DOB: December 10, 1935 DOA: 08/05/2022 DOS: the patient was seen and examined on 08/05/2022 PCP: [name], MD  Patient coming from: Home  Chief Complaint:  Chief Complaint  Patient presents with   Abdominal Pain   Weakness   HPI: Kayla Baker is a 86 y.o. female with medical history significant of ovarian cancer, HTN, CKD3b, DM2. Presenting with abdominal pain and weakness. She reports that she had a chemo session on 8/29. 3 days later she was having poor PO intake and feeling constipated. She took some miralax and senna. Over the next 24 hours, she had diarrhea and abdominal pain. The abdominal pain was all across her lower abdomen. It was intermittent and crampy. Her daughter insisted that she speak with the cancer center over the weekend. They recommended that she try to take some ensure. Her symptoms didn't improve. This morning, the on-call nurse recommended that she go to the ED for evaluation. She denies any other aggravating or alleviating factors.    Review of Systems: As mentioned in the history of present illness. All other systems reviewed and are negative. 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[place]) 02/29/2016   skin- 2016- squamous- on right upper arm   Chronic kidney disease    Closed displaced fracture of right femoral neck (HCC) 11/07/2021   Deficiency anemia 11/02/2020   Diabetes mellitus without complication (HCC)    GERD (gastroesophageal reflux disease)    Gout    takes Allopurinol daily   History of chemotherapy    History of colon polyps    benign   History of shingles    Hx of radiation therapy    Hyperlipidemia    Hypertension    Ovarian ca ([place]) dx'd 03/2020   Personal history of chemotherapy    2017   Personal history of radiation therapy    2017    Vitamin D deficiency    Past Surgical History:  Procedure Laterality Date   ABDOMINAL HYSTERECTOMY  1973   BREAST BIOPSY Left 02/26/2016   BREAST LUMPECTOMY Left 03/18/2016   CARDIAC CATHETERIZATION     patient denies this procedure   CATARACT EXTRACTION, BILATERAL  2014   right eye 2/14; left eye 3/3   COLONOSCOPY     HAND SURGERY Left 2012   HIP ARTHROPLASTY Right 11/08/2021   Procedure: ARTHROPLASTY BIPOLAR HIP (HEMIARTHROPLASTY) POSTERIOR;  Surgeon: Willaim Sheng, MD;  Location: [facility];  Service: Orthopedics;  Laterality: Right;   INCONTINENCE SURGERY  2006   IR IMAGING GUIDED PORT INSERTION  04/24/2020   IR PARACENTESIS  05/08/2020   IR PARACENTESIS  05/16/2020   IR PARACENTESIS  05/25/2020   KNEE ARTHROSCOPY Right    MASTOPEXY Right 06/16/2017   Procedure: RIGHT BREAST MASTOPEXY;  Surgeon: Irene Limbo, MD;  Location: [facility];  Service: Plastics;  Laterality: Right;   PORT-A-CATH REMOVAL N/A 11/27/2016   Procedure: REMOVAL PORT-A-CATH;  Surgeon: Fanny Skates, MD;  Location: [facility];  Service: General;  Laterality: N/A;   PORTACATH PLACEMENT N/A 04/14/2016   Procedure: INSERTION PORT-A-CATH ;  Surgeon: Fanny Skates, MD;  Location: [place];  Service: General;  Laterality: N/A;   RADIOACTIVE SEED GUIDED PARTIAL MASTECTOMY WITH AXILLARY SENTINEL LYMPH NODE BIOPSY Left 03/18/2016   Procedure: RADIOACTIVE SEED  GUIDED LEFT PARTIAL MASTECTOMY WITH AXILLARY SENTINEL LYMPH NODE BIOPSY AND BLUE DYE INJECTION;  Surgeon: Fanny Skates, MD;  Location: [facility];  Service: General;  Laterality: Left;   REDUCTION MAMMAPLASTY Right 2018   Social History:  reports that she has never smoked. She has never used smokeless tobacco. She reports that she does not drink alcohol and does not use drugs.  Allergies  Allergen Reactions   Keflex [Cephalexin] Swelling    Tongue and throat swells   Mobic [Meloxicam] Swelling   Prednisone Swelling and Other (See Comments)    Can Not  take High doses   Premarin [Estrogens Conjugated] Hives   Trovan [Alatrofloxacin] Other (See Comments)    Unknown reaction    Family History  Problem Relation Age of Onset   Stroke Mother 57   Other Mother        history of hysterectomy after last childbirth   Diabetes Father    Alzheimer's disease Father    Hypertension Sister    Lung cancer Sister 76       smoker   Other Sister        history of hysterectomy for fibroids   Breast cancer Sister 80   Cirrhosis Sister    Stroke Maternal Grandmother    Heart Problems Maternal Grandmother    Esophageal cancer Maternal Aunt 74       not a smoker   Parkinson's disease Maternal Aunt    Heart attack Maternal Uncle 65   Pancreatic cancer Cousin 5       paternal 1st cousin   Lung cancer Other        nephew dx. 28s; +smoker   Epilepsy Other    Other Other 12       great niece dx. benign ganglioglioma brain tumor; treated at [facility]   Epilepsy Other        no seizures in 2 years   Colon cancer Neg Hx    Stomach cancer Neg Hx    Colon polyps Neg Hx    Ulcerative colitis Neg Hx     Prior to Admission medications   Medication Sig Start Date End Date Taking? Authorizing Provider  acetaminophen (TYLENOL) 325 MG tablet Take 325 mg by mouth every 6 (six) hours as needed for headache.   Yes [provider]  allopurinol (ZYLOPRIM) 100 MG tablet Take 1 tablet (100 mg total) by mouth daily. FOR GOUT PREVENTION, Patient taking differently: Take 100 mg by mouth daily. 03/25/22  Yes Liane Comber, NP  Cholecalciferol (VITAMIN D3) 5000 units CAPS Take 5,000 Units by mouth daily.   Yes [provider]  cyanocobalamin 1000 MCG tablet Take 1,000 mcg by mouth daily.   Yes [provider]  dexamethasone (DECADRON) 4 MG tablet Take 2 tablets the morning before chemotherapy and then 2 tablets daily for 2 days after chemo 07/01/22  Yes Gorsuch, Ni, MD  ezetimibe (ZETIA) 10 MG tablet TAKE 1 TABLET BY MOUTH DAILY FOR  CHOLESTEROL Patient taking differently: Take 10 mg by mouth daily. 04/24/22  Yes Liane Comber, NP  gabapentin (NEURONTIN) 300 MG capsule Take 1 capsule 4 x  /day for Pain Patient taking differently: Take 300 mg by mouth 4 (four) times daily. 12/07/21  Yes [name], MD  glimepiride (AMARYL) 1 MG tablet Take 1 tab by mouth in the morning if fasting is running 150+. Please do not take this medication if you are ill or not eating as this can cause a low blood sugar. Patient  taking differently: Take 1 mg by mouth See admin instructions. Take 1 tab by mouth daily as needed if fasting is running 150+. Please do not take this medication if you are ill or not eating as this can cause a low blood sugar. 05/04/20  Yes Liane Comber, NP  hydrochlorothiazide (HYDRODIURIL) 25 MG tablet Take 1 tablet Daily for BP & Fluid Retention                                        /                          TAKE                           BY                   MOUTH Patient taking differently: Take 25 mg by mouth daily. 05/20/22  Yes [name], MD  lidocaine-prilocaine (EMLA) cream Apply 1 application. topically daily as needed (for port access). 03/07/22  Yes Gorsuch, Ni, MD  magnesium oxide (MAG-OX) 400 (240 Mg) MG tablet Take 1 tablet by mouth daily. 07/19/22  Yes [provider]  metoprolol succinate (TOPROL-XL) 25 MG 24 hr tablet Take  1 tablet  Daily  for BP       /TAKE 1 TABLET BY MOUTH DAILY FOR BLOOD PRESSURE Patient taking differently: Take 25 mg by mouth daily. 08/25/21  Yes [name], MD  ondansetron (ZOFRAN) 8 MG tablet Take 1 tablet (8 mg total) by mouth every 8 (eight) hours as needed for refractory nausea / vomiting. 06/13/22  Yes [name], Ni, MD  polyethylene glycol (MIRALAX) 17 g packet Take 17 g by mouth 2 (two) times daily. 17 grams in 6 oz of favorite drink twice a day until bowel movement.  LAXITIVE.  Restart if two days since last bowel movement Patient taking differently: Take 17 g  by mouth 2 (two) times daily. 11/10/21  Yes Shepperson, Kirstin, PA-C  rosuvastatin (CRESTOR) 5 MG tablet TAKE 1 TABLET BY MOUTH IN THE EVENING THREE TIMES WEEKLY FOR CHOLESTEROL Patient taking differently: Take 5 mg by mouth See admin instructions. Monday, Wednesday and Friday 03/07/22  Yes Liane Comber, NP  trolamine salicylate (ASPERCREME) 10 % cream Apply 1 application topically as needed for muscle pain (Use if you still feel tingles in feet in the morning after taking Gabapentin).   Yes [provider]  vitamin C (ASCORBIC ACID) 500 MG tablet Take 500 mg by mouth every evening.    Yes [provider]  amoxicillin (AMOXIL) 500 MG tablet Take 2,000 mg by mouth See admin instructions. Take prior to dental cleanings 03/03/22   [provider]    Physical Exam: Vitals:   08/05/22 1130 08/05/22 1200 08/05/22 1230 08/05/22 1300  BP: 139/73 139/61 (!) 146/66 (!) 151/63  Pulse: 95 89 91 88  Resp: 13 15 (!) 21 16  Temp:      TempSrc:      SpO2: 100% 97% 96% 99%  Weight:      Height:       General: 86 y.o. female resting in bed in NAD Eyes: PERRL, normal sclera ENMT: Nares patent w/o discharge, orophaynx clear, dentition normal, ears w/o discharge/lesions/ulcers Neck: Supple, trachea midline Cardiovascular: RRR, +S1, S2, no m/g/r, equal  pulses throughout Respiratory: CTABL, no w/r/r, normal WOB GI: BS+, ND, LLQ TTP, no masses noted, no organomegaly noted MSK: No e/c/c Neuro: A&O x 3, no focal deficits Psyc: Appropriate interaction and affect, calm/cooperative  Data Reviewed:  Lab Results  Component Value Date   NA 136 08/05/2022   K 3.9 08/05/2022   CO2 25 08/05/2022   GLUCOSE 131 (H) 08/05/2022   BUN 38 (H) 08/05/2022   CREATININE 1.74 (H) 08/05/2022   CALCIUM 8.6 (L) 08/05/2022   EGFR 46 (L) 06/16/2022   GFRNONAA 29 (L) 08/05/2022   Lab Results  Component Value Date   WBC 0.7 (LL) 08/05/2022   HGB 9.3 (L) 08/05/2022   HCT 28.1 (L) 08/05/2022    MCV 95.6 08/05/2022   PLT 54 (L) 08/05/2022   CXR: 1. No acute cardiopulmonary disease.  Assessment and Plan: Abdominal pain     - admit to inpt, tele     - neutropenic precautions     - check CT ab/pelvis     - fluids, anti-emetics, pain control  Neutropenia Pancytopenia     - onco onboard, appreciate assistance     - checking bld Cx, UCx  AKI on CKD3b     - fluids     - CT ordered for ab pain; will assess for renal abnormalities  Hypomagnesemia     - replace Mg2+, trend  DM2     - A1c, glucose checks, SSI     - NPO until CT ab completed  Ovarian cancer     - Dr. [name] aware that the patient is being admitted, she will follow  HLD     - continue home regimen when off NPO status  HTN     - continue home regimen when off NPO status     - PRN available  Advance Care Planning:   Code Status: FULL  Consults: Onco  Family Communication: w/ husband at bedside  Severity of Illness: The appropriate patient status for this patient is INPATIENT. Inpatient status is judged to be reasonable and necessary in order to provide the required intensity of service to ensure the patient's safety. The patient's presenting symptoms, physical exam findings, and initial radiographic and laboratory data in the context of their chronic comorbidities is felt to place them at high risk for further clinical deterioration. Furthermore, it is not anticipated that the patient will be medically stable for discharge from the hospital within 2 midnights of admission.   * I certify that at the point of admission it is my clinical judgment that the patient will require inpatient hospital care spanning beyond 2 midnights from the point of admission due to high intensity of service, high risk for further deterioration and high frequency of surveillance required.*  Time spent in coordination of this H&P: 66 minutes  Author: Jonnie Finner, DO 08/05/2022 1:05 PM  For on call review [email or web address].

## 2022-08-06 DIAGNOSIS — R1032 Left lower quadrant pain: Secondary | ICD-10-CM | POA: Diagnosis not present

## 2022-08-06 LAB — CBC
HCT: 26.1 % — ABNORMAL LOW (ref 36.0–46.0)
Hemoglobin: 8.6 g/dL — ABNORMAL LOW (ref 12.0–15.0)
MCH: 31.5 pg (ref 26.0–34.0)
MCHC: 33 g/dL (ref 30.0–36.0)
MCV: 95.6 fL (ref 80.0–100.0)
Platelets: 53 10*3/uL — ABNORMAL LOW (ref 150–400)
RBC: 2.73 MIL/uL — ABNORMAL LOW (ref 3.87–5.11)
RDW: 16.2 % — ABNORMAL HIGH (ref 11.5–15.5)
WBC: 0.5 10*3/uL — CL (ref 4.0–10.5)
nRBC: 0 % (ref 0.0–0.2)

## 2022-08-06 LAB — COMPREHENSIVE METABOLIC PANEL
ALT: 26 U/L (ref 0–44)
AST: 30 U/L (ref 15–41)
Albumin: 2.4 g/dL — ABNORMAL LOW (ref 3.5–5.0)
Alkaline Phosphatase: 65 U/L (ref 38–126)
Anion gap: 8 (ref 5–15)
BUN: 33 mg/dL — ABNORMAL HIGH (ref 8–23)
CO2: 24 mmol/L (ref 22–32)
Calcium: 8.3 mg/dL — ABNORMAL LOW (ref 8.9–10.3)
Chloride: 107 mmol/L (ref 98–111)
Creatinine, Ser: 1.45 mg/dL — ABNORMAL HIGH (ref 0.44–1.00)
GFR, Estimated: 36 mL/min — ABNORMAL LOW (ref >60)
Glucose, Bld: 124 mg/dL — ABNORMAL HIGH (ref 70–99)
Potassium: 3.8 mmol/L (ref 3.5–5.1)
Sodium: 139 mmol/L (ref 135–145)
Total Bilirubin: 1.9 mg/dL — ABNORMAL HIGH (ref 0.3–1.2)
Total Protein: 5.6 g/dL — ABNORMAL LOW (ref 6.5–8.1)

## 2022-08-06 LAB — GLUCOSE, CAPILLARY
Glucose-Capillary: 106 mg/dL — ABNORMAL HIGH (ref 70–99)
Glucose-Capillary: 105 mg/dL — ABNORMAL HIGH (ref 70–99)
Glucose-Capillary: 120 mg/dL — ABNORMAL HIGH (ref 70–99)
Glucose-Capillary: 106 mg/dL — ABNORMAL HIGH (ref 70–99)

## 2022-08-06 MED ORDER — PIPERACILLIN-TAZOBACTAM 3.375 G IVPB
3.3750 g | Freq: Three times a day (TID) | INTRAVENOUS | Status: DC
  Administered 2022-08-06 – 2022-08-08 (×6): 3.375 g via INTRAVENOUS
  Filled 2022-08-06 (×6): qty 50

## 2022-08-06 MED ORDER — CHLORHEXIDINE GLUCONATE CLOTH 2 % EX PADS
6.0000 | MEDICATED_PAD | Freq: Every day | CUTANEOUS | Status: DC
  Administered 2022-08-09: 6 via TOPICAL

## 2022-08-06 MED ORDER — PIPERACILLIN-TAZOBACTAM 3.375 G IVPB 30 MIN
3.3750 g | Freq: Once | INTRAVENOUS | Status: AC
  Administered 2022-08-06: 3.375 g via INTRAVENOUS
  Filled 2022-08-06 (×2): qty 50

## 2022-08-06 MED ORDER — PIPERACILLIN-TAZOBACTAM 3.375 G IVPB: 3.3750 g | Freq: Three times a day (TID) | INTRAVENOUS | Status: DC

## 2022-08-06 NOTE — Hospital Course (Addendum)
86 y.o. female with medical history significant of ovarian cancer, HTN, CKD3b, DM2. Presenting with abdominal pain and weakness.had chemo session on 07/29/22. In ED she's neutropenic, Kayla Baker was consulted and admitted.  Onco advised no prophylatic abx until she spikes a fever, per history she had diarrhea and abdominal pain. In ED- CXR neg, further got ct abd pelvis w/o- "consistent with diffuse interstitial pancreatitis,slight interval progression of peritoneal carcinomatosis". Patient spiked temp upto 102.6, wbc at 0.5. UA w/ cb 11-20, urine cx and blood cx ordered.  Patient was conservatively treated with IV fluid hydration, IV antibiotics-suspected to have UTI had febrile episode with leukopenia.  At this time WBC count has normalized platelet count improving, creatinine holding at 1.4-1.6 range also had hypokalemia replaced 08/08/22.  At this time tolerating regular diet  Urine culture with E. coli which is pansensitive.

## 2022-08-06 NOTE — Progress Notes (Signed)
Pharmacy Antibiotic Note  Kayla Baker is a 86 y.o. female admitted on 08/05/2022 with  febrile neutropenia .  Pharmacy has been consulted for antibiotic dosing.  Patient has an allergy to cephalexin (tongue and throat swelling) on profile. I discussed patients allergies and previous antibiotic use with the patient - she reports that she has taken amoxicillin previously and tolerated it. She confirms she took amoxicillin more recently than the cephalexin allergic reaction took place. Discussed with MD - will start piperacillin/tazobactam.  Today, 08/06/22 Neutropenic - WBC 0.5 Tmax 102.6 F SCr elevated, CrCl ~ 25 mL/min  Plan: Piperacillin/tazobactam 3.375 g IV once over 30 minutes followed by 3.375 g IV q8h EI Monitor renal function and culture data  Height: 5' 4"  (162.6 cm) Weight: 63.5 kg (139 lb 15.9 oz) IBW/kg (Calculated) : 54.7  Temp (24hrs), Avg:99.6 F (37.6 C), Min:98.4 F (36.9 C), Max:102.6 F (39.2 C)  Recent Labs  Lab 08/05/22 1019 08/06/22 0521  WBC 0.7* 0.5*  CREATININE 1.74* 1.45*    Estimated Creatinine Clearance: 24.9 mL/min (A) (by C-G formula based on SCr of 1.45 mg/dL (H)).    Allergies  Allergen Reactions   Keflex [Cephalexin] Swelling    Tongue and throat swells   Mobic [Meloxicam] Swelling   Prednisone Swelling and Other (See Comments)    Can Not take High doses   Premarin [Estrogens Conjugated] Hives   Trovan [Alatrofloxacin] Other (See Comments)    Unknown reaction    Antimicrobials this admission: Piperacillin/tazobactam 9/6 >>   Dose adjustments this admission:  Microbiology results: 9/5 BCx: ngtd 9/5 UCx:    Lenis Noon, PharmD 08/06/2022 8:28 AM

## 2022-08-06 NOTE — Progress Notes (Signed)
PROGRESS NOTE Kayla Baker  IHW:[id number] DOB: 20-Nov-1936 DOA: 08/05/2022 PCP: [name], MD   Brief Narrative/Hospital Course: 86 y.o. female with medical history significant of ovarian cancer, HTN, CKD3b, DM2. Presenting with abdominal pain and weakness.had chemo session on 07/29/22. In ED she's neutropenic, [name] was consulted and admitted.  Onco advised no prophylatic abx until she spikes a fever, per history she had diarrhea and abdominal pain. In ED- CXR neg, further got ct abd pelvis w/o- "consistent with diffuse interstitial pancreatitis,slight interval progression of peritoneal carcinomatosis". Patient spiked temp upto 102.6, wbc at 0.5. UA w/ cb 11-20, urine cx and blood cx ordered.    Subjective: Seen and examined this morning. Patient reports he feels much improved no more abdominal pain Overnight febrile Tmax 102.6 yesterday evening Creatinine slightly better at 1.4, WBC 0.5  Assessment and Plan:  Neutropenic fever: With fever overnight started on Zosyn, allergy to cephalosporin but has tolerated amoxicillin.  Pharmacy consulted.  Follow-up for urine and blood culture, temperature curve  Acute pancreatitis: CT w/ dissuse interstitial pancreatitis, continue current conservative management IV fluids antiemetics pain medication diet as tolerated  AKI on CKD stage IIIb: Baseline creatinine around 1.1 in July.  Continue gentle normal saline monitor electrolytes Recent Labs  Lab 08/05/22 1019 08/06/22 0521  BUN 38* 33*  CREATININE 1.74* 1.45*    Acquired pancytopenia: Due to recent treatment, has chronic pancytopenia at baseline due to chronic bone marrow suppression from treatment.  Monitor CBC, continue neutropenic precaution. Recent Labs  Lab 08/05/22 1019 08/06/22 0521  HGB 9.3* 8.6*  HCT 28.1* 26.1*  WBC 0.7* 0.5*  PLT 54* 53*    Type 2 diabetes mellitus blood sugar well controlled continue sliding scale insulin.  Holding p.o. meds Recent Labs  Lab  08/05/22 0951 08/05/22 1640 08/05/22 2152 08/06/22 0745 08/06/22 1129  GLUCAP 114* 108* 94 106* 105*    Hypomagnesemia replaced Recurrent ovarian cancer followed by Dr. Hilbert Odor Hypertension: BP stable Hyperlipidemia holding meds for now  DVT prophylaxis: SCDs Start: 08/05/22 1523 Code Status:   Code Status: Full Code Family Communication: plan of care discussed with patient at bedside. Patient status is: Inpatient because of acute pancreatitis/neutropenia Level of care: Telemetry   Dispo: The patient is from: Home            Anticipated disposition: tbd  Mobility Assessment (last 72 hours)     Mobility Assessment     Row Name 08/05/22 1601           Does patient have an order for bedrest or is patient medically unstable No - Continue assessment       What is the highest level of mobility based on the progressive mobility assessment? Level 3 (Stands with assist) - Balance while standing  and cannot march in place       Is the above level different from baseline mobility prior to current illness? Yes - Recommend PT order                 Objective: Vitals last 24 hrs: Vitals:   08/05/22 1903 08/05/22 2115 08/06/22 0356 08/06/22 1330  BP: (!) 113/47 (!) 114/44 (!) 128/56 (!) 96/52  Pulse: (!) 106 98 98 88  Resp: 18  18 19   Temp: (!) 100.9 F (38.3 C) 99.1 F (37.3 C) 98.8 F (37.1 C) 98.1 F (36.7 C)  TempSrc: Oral Oral Oral Oral  SpO2: 97% 98% 92% 94%  Weight:      Height:  Weight change:   Physical Examination: General exam: alert awake,older than stated age, weak appearing. HEENT:Oral mucosa moist, Ear/Nose WNL grossly, dentition normal. Respiratory system: bilaterally diminished BS, no use of accessory muscle Cardiovascular system: S1 & S2 +, No JVD. Gastrointestinal system: Abdomen soft, minimally tender central abdomen,ND, BS+ Nervous System:Alert, awake, moving extremities and grossly nonfocal Extremities: LE edema neg,distal peripheral pulses  palpable.  Skin: No rashes,no icterus. MSK: Normal muscle bulk,tone, power  Medications reviewed:  Scheduled Meds:  allopurinol  100 mg Oral Daily   Chlorhexidine Gluconate Cloth  6 each Topical Daily   ezetimibe  10 mg Oral Daily   insulin aspart  0-9 Units Subcutaneous TID WC   metoprolol succinate  25 mg Oral Daily   rosuvastatin  5 mg Oral Q M,W,F   Continuous Infusions:  sodium chloride Stopped (08/05/22 1607)   piperacillin-tazobactam (ZOSYN)  IV        Diet Order             Diet NPO time specified Except for: Sips with Meds, Ice Chips  Diet effective now                            Intake/Output Summary (Last 24 hours) at 08/06/2022 1334 Last data filed at 08/06/2022 0500 Gross per 24 hour  Intake 98.53 ml  Output 600 ml  Net -501.47 ml   Net IO Since Admission: -501.47 mL [08/06/22 1334]  Wt Readings from Last 3 Encounters:  08/05/22 63.5 kg  07/29/22 64.9 kg  07/25/22 63.6 kg     Unresulted Labs (From admission, onward)     Start     Ordered   08/07/22 0500  Creatinine, serum  Tomorrow morning,   R        08/06/22 0848   08/05/22 1523  Urine Culture  (Urine Culture)  Once,   R       Question:  Indication  Answer:  Dysuria   08/05/22 1522          Data Reviewed: I have personally reviewed following labs and imaging studies CBC: Recent Labs  Lab 08/05/22 1019 08/06/22 0521  WBC 0.7* 0.5*  HGB 9.3* 8.6*  HCT 28.1* 26.1*  MCV 95.6 95.6  PLT 54* 53*   Basic Metabolic Panel: Recent Labs  Lab 08/05/22 1019 08/06/22 0521  NA 136 139  K 3.9 3.8  CL 100 107  CO2 25 24  GLUCOSE 131* 124*  BUN 38* 33*  CREATININE 1.74* 1.45*  CALCIUM 8.6* 8.3*  MG 1.4*  --   PHOS 3.2  --    GFR: Estimated Creatinine Clearance: 24.9 mL/min (A) (by C-G formula based on SCr of 1.45 mg/dL (H)). Liver Function Tests: Recent Labs  Lab 08/05/22 1019 08/06/22 0521  AST 38 30  ALT 30 26  ALKPHOS 75 65  BILITOT 1.9* 1.9*  PROT 6.3* 5.6*  ALBUMIN  2.7* 2.4*   No results for input(s): "LIPASE", "AMYLASE" in the last 168 hours. No results for input(s): "AMMONIA" in the last 168 hours. Coagulation Profile: No results for input(s): "INR", "PROTIME" in the last 168 hours. BNP (last 3 results) No results for input(s): "PROBNP" in the last 8760 hours. HbA1C: No results for input(s): "HGBA1C" in the last 72 hours. CBG: Recent Labs  Lab 08/05/22 0951 08/05/22 1640 08/05/22 2152 08/06/22 0745 08/06/22 1129  GLUCAP 114* 108* 94 106* 105*   Lipid Profile: No results for input(s): "CHOL", "HDL", "  [place]", "TRIG", "CHOLHDL", "LDLDIRECT" in the last 72 hours. Thyroid Function Tests: No results for input(s): "TSH", "T4TOTAL", "FREET4", "T3FREE", "THYROIDAB" in the last 72 hours. Sepsis Labs: No results for input(s): "PROCALCITON", "LATICACIDVEN" in the last 168 hours.  Recent Results (from the past 240 hour(s))  Culture, blood (Routine X 2) w Reflex to ID Panel     Status: None (Preliminary result)   Collection Time: 08/05/22 12:49 PM   Specimen: BLOOD  Result Value Ref Range Status   Specimen Description   Final    BLOOD RIGHT ANTECUBITAL Performed at [facility] [address] [place], [place] [address]    Special Requests   Final    BOTTLES DRAWN AEROBIC AND ANAEROBIC Blood Culture adequate volume Performed at [facility] [address] [place], [place] [address]    Culture   Final    NO GROWTH < 24 HOURS Performed at [facility] [address] [place], [place] [address]    Report Status PENDING  Incomplete  Culture, blood (Routine X 2) w Reflex to ID Panel     Status: None (Preliminary result)   Collection Time: 08/05/22  1:15 PM   Specimen: BLOOD  Result Value Ref Range Status   Specimen Description   Final    BLOOD LEFT ANTECUBITAL Performed at [facility] [address] [place], [place] [address]    Special Requests   Final    BOTTLES DRAWN  AEROBIC AND ANAEROBIC Blood Culture adequate volume Performed at [facility] [address] [place], [place] [address]    Culture   Final    NO GROWTH < 24 HOURS Performed at [facility] [address] [place], [place] [address]    Report Status PENDING  Incomplete    Antimicrobials: Anti-infectives (From admission, onward)    Start     Dose/Rate Route Frequency Ordered Stop   08/06/22 1800  piperacillin-tazobactam (ZOSYN) IVPB 3.375 g        3.375 g 12.5 mL/hr over 240 Minutes Intravenous Every 8 hours 08/06/22 0948     08/06/22 1500  piperacillin-tazobactam (ZOSYN) IVPB 3.375 g  Status:  Discontinued        3.375 g 12.5 mL/hr over 240 Minutes Intravenous Every 8 hours 08/06/22 0846 08/06/22 0948   08/06/22 0930  piperacillin-tazobactam (ZOSYN) IVPB 3.375 g        3.375 g 100 mL/hr over 30 Minutes Intravenous  Once 08/06/22 0844 08/06/22 1008      Culture/Microbiology    Component Value Date/Time   SDES  08/05/2022 1315    BLOOD LEFT ANTECUBITAL Performed at [facility], [place] [address] [place], [place] [address]    SPECREQUEST  08/05/2022 1315    BOTTLES DRAWN AEROBIC AND ANAEROBIC Blood Culture adequate volume Performed at [facility], [place] [address] [place], [place] [address]    CULT  08/05/2022 1315    NO GROWTH < 24 HOURS Performed at [facility] [address] [place], [place] [address]    REPTSTATUS PENDING 08/05/2022 1315    Other culture-see note  Radiology Studies: CT ABDOMEN PELVIS WO CONTRAST  Result Date: 08/05/2022 CLINICAL DATA:  Left-sided abdominal pain. History of ovarian cancer. * Tracking Code: BO * EXAM: CT ABDOMEN AND PELVIS WITHOUT CONTRAST TECHNIQUE: Multidetector CT imaging of the abdomen and pelvis was performed following the standard protocol without IV contrast. RADIATION DOSE REDUCTION: This exam was performed  according to the departmental dose-optimization  program which includes automated exposure control, adjustment of the mA and/or kV according to patient size and/or use of iterative reconstruction technique. COMPARISON:  06/11/2022 FINDINGS: Lower chest: The lung bases are clear of an acute process. Minimal streaky bibasilar atelectasis. The heart is normal in size. No pericardial effusion. Hepatobiliary: No hepatic lesions are identified without contrast. No intrahepatic biliary dilatation. Small layering gallstones suspected in the gallbladder. No findings for acute cholecystitis. No common bile duct dilatation. Pancreas: Diffuse inflammation/edema in and around the pancreas consistent with diffuse interstitial pancreatitis. Spleen: Stable mild splenomegaly. Adrenals/Urinary Tract: Adrenal glands and kidneys are. Bladder is grossly normal. Stomach/Bowel: The stomach, duodenum, small bowel and colon are grossly no. Vascular/Lymphatic: Moderate atherosclerotic calcification involving the aorta but no aneurysm. Stable scattered mesenteric and retroperitoneal lymph nodes and progressive peritoneal carcinomatosis. Numerous omental and peritoneal implants slightly progressive when compared to the prior study. The soft tissue lesion in the right lower quadrant adjacent to the ascending colon measures 2.6 x 2.1 cm and previously measured 2.5 x 2.0 cm. Multiple pelvic implants are grossly stable to slightly enlarged. Reproductive: Surgically absent. Other: Small amount of free pelvic fluid noted. Musculoskeletal: No significant bony findings. IMPRESSION: 1. 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 2. Slight interval progression of peritoneal carcinomatosis. Aortic Atherosclerosis (ICD10-I70.0). Electronically Signed   By: Marijo Sanes M.D.   On: 08/05/2022 14:20   DG Chest Port 1 View  Result Date: 08/05/2022 CLINICAL DATA:  Weakness with decreased p.o. intake. EXAM: PORTABLE CHEST 1 VIEW COMPARISON:  November 07, 2021 FINDINGS: EKG leads project  over the chest. RIGHT-sided Port-A-Cath terminating at the caval to atrial junction. Mild rotation to the RIGHT. Accounting for this cardiomediastinal contours and hilar structures are normal. Lungs are clear.  No sign of effusion.  No pneumothorax. Post LEFT breast lumpectomy. On limited assessment no acute skeletal findings. IMPRESSION: 1. No acute cardiopulmonary disease. Electronically Signed   By: Zetta Bills M.D.   On: 08/05/2022 09:59     LOS: 1 day   Antonieta Pert, MD [facility]  08/06/2022, 1:34 PM

## 2022-08-06 NOTE — TOC Initial Note (Signed)
Transition of Care [facility]) - Initial/Assessment Note    Patient Details  Name: Kayla Baker MRN: [id number] Date of Birth: [birth year]  Transition of Care [facility]) CM/SW Contact:    Leeroy Cha, RN Phone Number: 08/06/2022, 8:19 AM  Clinical Narrative:                  Transition of Care [facility]) Screening Note   Patient Details  Name: Kayla Baker Date of Birth: 11-13-1936   Transition of Care [facility]) CM/SW Contact:    Leeroy Cha, RN Phone Number: 08/06/2022, 8:19 AM    Transition of Care Department [facility]) has reviewed patient and no TOC needs have been identified at this time. We will continue to monitor patient advancement through interdisciplinary progression rounds. If new patient transition needs arise, please place a TOC consult.    Expected Discharge Plan: Home/Self Care Barriers to Discharge: Continued Medical Work up   Patient Goals and CMS Choice     Choice offered to / list presented to : Patient  Expected Discharge Plan and Services Expected Discharge Plan: Home/Self Care   Discharge Planning Services: CM Consult   Living arrangements for the past 2 months: Single Family Home                                      Prior Living Arrangements/Services Living arrangements for the past 2 months: Single Family Home Lives with:: Spouse Patient language and need for interpreter reviewed:: Yes Do you feel safe going back to the place where you live?: Yes            Criminal Activity/Legal Involvement Pertinent to Current Situation/Hospitalization: No - Comment as needed  Activities of Daily Living Home Assistive Devices/Equipment: Environmental consultant (specify type), Cane (specify quad or straight), Raised toilet seat with rails, Eyeglasses ADL Screening (condition at time of admission) Patient's cognitive ability adequate to safely complete daily activities?: Yes Is the patient deaf or have difficulty hearing?: No Does the patient have difficulty seeing,  even when wearing glasses/contacts?: No Does the patient have difficulty concentrating, remembering, or making decisions?: Yes (some trouble concentrating) Patient able to express need for assistance with ADLs?: Yes Does the patient have difficulty dressing or bathing?: No Independently performs ADLs?: No Communication: Independent Dressing (OT): Independent Grooming: Independent Feeding: Independent Bathing: Independent Toileting: Needs assistance Is this a change from baseline?: Change from baseline, expected to last >3days In/Out Bed: Needs assistance Is this a change from baseline?: Change from baseline, expected to last >3 days Walks in Home: Needs assistance Is this a change from baseline?: Change from baseline, expected to last >3 days Does the patient have difficulty walking or climbing stairs?: Yes Weakness of Legs: Both Weakness of Arms/Hands: Both  Permission Sought/Granted                  Emotional Assessment Appearance:: Appears stated age Attitude/Demeanor/Rapport: Engaged Affect (typically observed): Calm Orientation: : Oriented to Self, Oriented to Place, Oriented to  Time, Oriented to Situation Alcohol / Substance Use: Never Used Psych Involvement: No (comment)  Admission diagnosis:  Chemotherapy-induced neutropenia ([place]) [D70.1, T45.1X5A] Generalized weakness [R53.1] AKI (acute kidney injury) ([place]) [N17.9] Abdominal pain [R10.9] Chemotherapy adverse reaction, initial encounter [T45.1X5A] Malignant neoplasm of ovary, unspecified laterality ([place]) [C56.9] Patient Active Problem List   Diagnosis Date Noted   Abdominal pain 08/05/2022   Stage 3b chronic kidney disease (CKD) ([place])  08/05/2022   Neutropenia ([place]) 08/05/2022   AKI (acute kidney injury) ([place]) 08/05/2022   Generalized weakness    Facial rash 07/25/2022   Diarrhea 05/12/2022   Hypomagnesemia 02/22/2022   Poor dentition 07/26/2021   Nodule of lower lobe of right lung 06/27/2021   Aortic  atherosclerosis ([place]) by Abd CT scan on 05/10/2021 06/10/2021   Mild nonproliferative diabetic retinopathy of both eyes without macular edema associated with type 2 diabetes mellitus ([place]) 02/20/2021   Osteopenia 02/20/2021   Genetic testing 09/14/2020   Pancytopenia, acquired ([place]) 05/15/2020   Cancer associated pain 05/15/2020   Anemia in neoplastic disease 05/03/2020   Goals of care, counseling/discussion 04/20/2020   Ovarian cancer ([place]) 04/19/2020   Edema of right lower leg 04/05/2020   CKD stage 3 due to type 2 diabetes mellitus ([place]) 07/05/2018   Adhesive capsulitis of right shoulder 04/27/2018   Postoperative breast asymmetry 04/01/2017   Port catheter in place 09/18/2016   Chemotherapy-induced peripheral neuropathy ([place]) 08/07/2016   History of left breast cancer 02/29/2016   Gout 12/08/2014   Medication management 08/27/2014   Essential hypertension 11/10/2013   Hyperlipidemia associated with type 2 diabetes mellitus ([place]) 11/10/2013   DM2 (diabetes mellitus, type 2) ([place]) 11/10/2013   Vitamin D deficiency 11/10/2013   PCP:  [name], MD Pharmacy:   [facility] [place], [place] - [address] [place] [address] [place] [address] Phone: [phone] Fax: [phone]  [facility] [place], [place] [address] AT [facility] [address] [place] [address] [place] [address] Phone: [phone] Fax: [place] [phone] - [place], [place] - [address] [place] [address] Phone: [phone] Fax: [phone]  [place] [address] Phone: [phone] Fax: [phone]     Social Determinants of Health (SDOH) Interventions    Readmission Risk Interventions   No data to display

## 2022-08-07 LAB — CBC WITH DIFFERENTIAL/PLATELET
Abs Immature Granulocytes: 0.05 10*3/uL (ref 0.00–0.07)
Basophils Absolute: 0.1 10*3/uL (ref 0.0–0.1)
Basophils Relative: 2 %
Eosinophils Absolute: 0 10*3/uL (ref 0.0–0.5)
Eosinophils Relative: 0 %
HCT: 28.4 % — ABNORMAL LOW (ref 36.0–46.0)
Hemoglobin: 9.3 g/dL — ABNORMAL LOW (ref 12.0–15.0)
Immature Granulocytes: 2 %
Lymphocytes Relative: 18 %
Lymphs Abs: 0.5 10*3/uL — ABNORMAL LOW (ref 0.7–4.0)
MCH: 31.4 pg (ref 26.0–34.0)
MCHC: 32.7 g/dL (ref 30.0–36.0)
MCV: 95.9 fL (ref 80.0–100.0)
Monocytes Absolute: 0.3 10*3/uL (ref 0.1–1.0)
Monocytes Relative: 12 %
Neutro Abs: 1.8 10*3/uL (ref 1.7–7.7)
Neutrophils Relative %: 66 %
Platelets: 78 10*3/uL — ABNORMAL LOW (ref 150–400)
RBC: 2.96 MIL/uL — ABNORMAL LOW (ref 3.87–5.11)
RDW: 16.1 % — ABNORMAL HIGH (ref 11.5–15.5)
WBC: 2.8 10*3/uL — ABNORMAL LOW (ref 4.0–10.5)
nRBC: 0 % (ref 0.0–0.2)

## 2022-08-07 LAB — URINE CULTURE: Culture: >=100000 — AB

## 2022-08-07 LAB — GLUCOSE, CAPILLARY
Glucose-Capillary: 68 mg/dL — ABNORMAL LOW (ref 70–99)
Glucose-Capillary: 75 mg/dL (ref 70–99)
Glucose-Capillary: 174 mg/dL — ABNORMAL HIGH (ref 70–99)
Glucose-Capillary: 111 mg/dL — ABNORMAL HIGH (ref 70–99)
Glucose-Capillary: 97 mg/dL (ref 70–99)

## 2022-08-07 LAB — CREATININE, SERUM
Creatinine, Ser: 1.44 mg/dL — ABNORMAL HIGH (ref 0.44–1.00)
GFR, Estimated: 36 mL/min — ABNORMAL LOW (ref >60)

## 2022-08-07 LAB — LIPASE, BLOOD: Lipase: 37 U/L (ref 11–51)

## 2022-08-07 MED ORDER — GABAPENTIN 300 MG PO CAPS
300.0000 mg | ORAL_CAPSULE | Freq: Four times a day (QID) | ORAL | Status: DC
Start: 2022-08-07 — End: 2022-08-07

## 2022-08-07 MED ORDER — DEXTROSE-NACL 5-0.9 % IV SOLN: INTRAVENOUS | Status: DC

## 2022-08-07 MED ORDER — GABAPENTIN 100 MG PO CAPS
200.0000 mg | ORAL_CAPSULE | Freq: Four times a day (QID) | ORAL | Status: DC
  Administered 2022-08-07 – 2022-08-09 (×7): 200 mg via ORAL
  Filled 2022-08-07 (×7): qty 2

## 2022-08-07 NOTE — Progress Notes (Signed)
PROGRESS NOTE Kayla Baker  HMC:[id number] DOB: [birth year] DOA: 08/05/2022 PCP: [name], MD   Brief Narrative/Hospital Course: 86 y.o. female with medical history significant of ovarian cancer, HTN, CKD3b, DM2. Presenting with abdominal pain and weakness.had chemo session on 07/29/22. In ED she's neutropenic, [name] was consulted and admitted.  Onco advised no prophylatic abx until she spikes a fever, per history she had diarrhea and abdominal pain. In ED- CXR neg, further got ct abd pelvis w/o- "consistent with diffuse interstitial pancreatitis,slight interval progression of peritoneal carcinomatosis". Patient spiked temp upto 102.6, wbc at 0.5. UA w/ cb 11-20, urine cx and blood cx ordered.    Subjective:  Had bm runny, on clears, no belly pain or nausea or vomiting  Assessment and Plan:  Neutropenic fever: With fever overnight started on Zosyn, allergy to cephalosporin but has tolerated amoxicillin.  Pharmacy consulted.  Follow-up for urine and blood culture, temperature curve  Acute pancreatitis: CT w/ dissuse interstitial pancreatitis, continue current conservative management IV fluids antiemetics pain medication diet as tolerated  AKI on CKD stage IIIb: Baseline creatinine around 1.1 in July.  Continue gentle normal saline monitor electrolytes Recent Labs  Lab 08/05/22 1019 08/06/22 0521 08/07/22 0534  BUN 38* 33*  --   CREATININE 1.74* 1.45* 1.44*     Acquired pancytopenia: Due to recent treatment, has chronic pancytopenia at baseline due to chronic bone marrow suppression from treatment.  Monitor CBC, continue neutropenic precaution. Recent Labs  Lab 08/05/22 1019 08/06/22 0521 08/07/22 0832  HGB 9.3* 8.6* 9.3*  HCT 28.1* 26.1* 28.4*  WBC 0.7* 0.5* 2.8*  PLT 54* 53* 78*     Type 2 diabetes mellitus blood sugar well controlled continue sliding scale insulin.  Holding p.o. meds Recent Labs  Lab 08/06/22 1129 08/06/22 1619 08/06/22 2137 08/07/22 0756  08/07/22 0839  GLUCAP 105* 120* 106* 68* 75     Hypomagnesemia replaced Recurrent ovarian cancer followed by Dr. Hilbert Odor Hypertension: BP stable Hyperlipidemia holding meds for now  DVT prophylaxis: SCDs Start: 08/05/22 1523 Code Status:   Code Status: Full Code Family Communication: plan of care discussed with patient at bedside. Patient status is: Inpatient because of acute pancreatitis/neutropenia Level of care: Telemetry   Dispo: The patient is from: Home            Anticipated disposition: tbd  Mobility Assessment (last 72 hours)     Mobility Assessment     Row Name 08/05/22 1601           Does patient have an order for bedrest or is patient medically unstable No - Continue assessment       What is the highest level of mobility based on the progressive mobility assessment? Level 3 (Stands with assist) - Balance while standing  and cannot march in place       Is the above level different from baseline mobility prior to current illness? Yes - Recommend PT order                 Objective: Vitals last 24 hrs: Vitals:   08/06/22 0356 08/06/22 1330 08/06/22 2135 08/07/22 0614  BP: (!) 128/56 (!) 96/52 (!) 102/52 (!) 115/46  Pulse: 98 88 76 70  Resp: 18 19 18    Temp: 98.8 F (37.1 C) 98.1 F (36.7 C) (!) 97.5 F (36.4 C) 98 F (36.7 C)  TempSrc: Oral Oral Oral Oral  SpO2: 92% 94% 96% 90%  Weight:      Height:  Weight change:   Physical Examination: General exam: alert awake,older than stated age, weak appearing. HEENT:Oral mucosa moist, Ear/Nose WNL grossly, dentition normal. Respiratory system: bilaterally diminished BS, no use of accessory muscle Cardiovascular system: S1 & S2 +, No JVD. Gastrointestinal system: Abdomen soft, minimally tender central abdomen,ND, BS+ Nervous System:Alert, awake, moving extremities and grossly nonfocal Extremities: LE edema neg,distal peripheral pulses palpable.  Skin: No rashes,no icterus. MSK: Normal muscle  bulk,tone, power  Medications reviewed:  Scheduled Meds:  allopurinol  100 mg Oral Daily   Chlorhexidine Gluconate Cloth  6 each Topical Daily   ezetimibe  10 mg Oral Daily   insulin aspart  0-9 Units Subcutaneous TID WC   metoprolol succinate  25 mg Oral Daily   rosuvastatin  5 mg Oral Q M,W,F   Continuous Infusions:  sodium chloride 75 mL/hr at 08/07/22 0742   piperacillin-tazobactam (ZOSYN)  IV 3.375 g (08/07/22 0152)      Diet Order             Diet clear liquid Room service appropriate? Yes; Fluid consistency: Thin  Diet effective now                            Intake/Output Summary (Last 24 hours) at 08/07/2022 0951 Last data filed at 08/06/2022 1600 Gross per 24 hour  Intake 2053.59 ml  Output --  Net 2053.59 ml    Net IO Since Admission: 1,552.12 mL [08/07/22 0951]  Wt Readings from Last 3 Encounters:  08/05/22 63.5 kg  07/29/22 64.9 kg  07/25/22 63.6 kg     Unresulted Labs (From admission, onward)     Start     Ordered   08/07/22 0736  CBC with Differential/Platelet  Daily at 5am,   R      08/07/22 0735          Data Reviewed: I have personally reviewed following labs and imaging studies CBC: Recent Labs  Lab 08/05/22 1019 08/06/22 0521 08/07/22 0832  WBC 0.7* 0.5* 2.8*  NEUTROABS  --   --  1.8  HGB 9.3* 8.6* 9.3*  HCT 28.1* 26.1* 28.4*  MCV 95.6 95.6 95.9  PLT 54* 53* 78*    Basic Metabolic Panel: Recent Labs  Lab 08/05/22 1019 08/06/22 0521 08/07/22 0534  NA 136 139  --   K 3.9 3.8  --   CL 100 107  --   CO2 25 24  --   GLUCOSE 131* 124*  --   BUN 38* 33*  --   CREATININE 1.74* 1.45* 1.44*  CALCIUM 8.6* 8.3*  --   MG 1.4*  --   --   PHOS 3.2  --   --     GFR: Estimated Creatinine Clearance: 25.1 mL/min (A) (by C-G formula based on SCr of 1.44 mg/dL (H)). Liver Function Tests: Recent Labs  Lab 08/05/22 1019 08/06/22 0521  AST 38 30  ALT 30 26  ALKPHOS 75 65  BILITOT 1.9* 1.9*  PROT 6.3* 5.6*  ALBUMIN 2.7*  2.4*    No results for input(s): "LIPASE", "AMYLASE" in the last 168 hours. No results for input(s): "AMMONIA" in the last 168 hours. Coagulation Profile: No results for input(s): "INR", "PROTIME" in the last 168 hours. BNP (last 3 results) No results for input(s): "PROBNP" in the last 8760 hours. HbA1C: No results for input(s): "HGBA1C" in the last 72 hours. CBG: Recent Labs  Lab 08/06/22 1129 08/06/22 1619 08/06/22 2137 08/07/22  0756 08/07/22 0839  GLUCAP 105* 120* 106* 68* 75    Lipid Profile: No results for input(s): "CHOL", "HDL", "LDLCALC", "TRIG", "CHOLHDL", "LDLDIRECT" in the last 72 hours. Thyroid Function Tests: No results for input(s): "TSH", "T4TOTAL", "FREET4", "T3FREE", "THYROIDAB" in the last 72 hours. Sepsis Labs: No results for input(s): "PROCALCITON", "LATICACIDVEN" in the last 168 hours.  Recent Results (from the past 240 hour(s))  Culture, blood (Routine X 2) w Reflex to ID Panel     Status: None (Preliminary result)   Collection Time: 08/05/22 12:49 PM   Specimen: BLOOD  Result Value Ref Range Status   Specimen Description   Final    BLOOD RIGHT ANTECUBITAL Performed at [facility] [address], [place], [place] [address]    Special Requests   Final    BOTTLES DRAWN AEROBIC AND ANAEROBIC Blood Culture adequate volume Performed at [facility] [address] [place], [place] [address]    Culture   Final    NO GROWTH 2 DAYS Performed at [facility] [address], [place], [place] [address]    Report Status PENDING  Incomplete  Culture, blood (Routine X 2) w Reflex to ID Panel     Status: None (Preliminary result)   Collection Time: 08/05/22  1:15 PM   Specimen: BLOOD  Result Value Ref Range Status   Specimen Description   Final    BLOOD LEFT ANTECUBITAL Performed at [facility] [address] [place], [place] [address]    Special Requests   Final    BOTTLES DRAWN  AEROBIC AND ANAEROBIC Blood Culture adequate volume Performed at [facility] [address] [place], [place] [address]    Culture   Final    NO GROWTH 2 DAYS Performed at [facility] [address], [place], [place] [address]    Report Status PENDING  Incomplete  Urine Culture     Status: Abnormal   Collection Time: 08/05/22  5:18 PM   Specimen: In/Out Cath Urine  Result Value Ref Range Status   Specimen Description   Final    IN/OUT CATH URINE Performed at [facility] [address], [place], [place] [address]    Special Requests   Final    NONE Performed at [facility], [place] [address] [place],  [address]    Culture >=100,000 COLONIES/mL ESCHERICHIA COLI (A)  Final   Report Status 08/07/2022 FINAL  Final   Organism ID, Bacteria ESCHERICHIA COLI (A)  Final      Susceptibility   Escherichia coli - MIC*    AMPICILLIN <=2 SENSITIVE Sensitive     CEFAZOLIN <=4 SENSITIVE Sensitive     CEFEPIME <=0.12 SENSITIVE Sensitive     CEFTRIAXONE <=0.25 SENSITIVE Sensitive     CIPROFLOXACIN <=0.25 SENSITIVE Sensitive     GENTAMICIN <=1 SENSITIVE Sensitive     IMIPENEM <=0.25 SENSITIVE Sensitive     NITROFURANTOIN <=16 SENSITIVE Sensitive     TRIMETH/SULFA <=20 SENSITIVE Sensitive     AMPICILLIN/SULBACTAM <=2 SENSITIVE Sensitive     PIP/TAZO <=4 SENSITIVE Sensitive     * >=100,000 COLONIES/mL ESCHERICHIA COLI    Antimicrobials: Anti-infectives (From admission, onward)    Start     Dose/Rate Route Frequency Ordered Stop   08/06/22 1800  piperacillin-tazobactam (ZOSYN) IVPB 3.375 g        3.375 g 12.5 mL/hr over 240 Minutes Intravenous Every 8 hours 08/06/22 0948  08/06/22 1500  piperacillin-tazobactam (ZOSYN) IVPB 3.375 g  Status:  Discontinued        3.375 g 12.5 mL/hr over 240 Minutes Intravenous Every 8 hours 08/06/22 0846 08/06/22 0948   08/06/22 0930  piperacillin-tazobactam (ZOSYN) IVPB 3.375 g         3.375 g 100 mL/hr over 30 Minutes Intravenous  Once 08/06/22 0844 08/06/22 1008      Culture/Microbiology    Component Value Date/Time   SDES  08/05/2022 1718    IN/OUT CATH URINE Performed at [facility], [place] [address] [place], [place] [address]    SPECREQUEST  08/05/2022 1718    NONE Performed at [facility], [place] [address] [place], [place] [address]    CULT >=100,000 COLONIES/mL ESCHERICHIA COLI (A) 08/05/2022 1718   REPTSTATUS 08/07/2022 FINAL 08/05/2022 1718    Other culture-see note  Radiology Studies: CT ABDOMEN PELVIS WO CONTRAST  Result Date: 08/05/2022 CLINICAL DATA:  Left-sided abdominal pain. History of ovarian cancer. * Tracking Code: BO * EXAM: CT ABDOMEN AND PELVIS WITHOUT CONTRAST TECHNIQUE: Multidetector CT imaging of the abdomen and pelvis was performed following the standard protocol without IV contrast. RADIATION DOSE REDUCTION: This exam was performed according to the departmental dose-optimization program which includes automated exposure control, adjustment of the mA and/or kV according to patient size and/or use of iterative reconstruction technique. COMPARISON:  06/11/2022 FINDINGS: Lower chest: The lung bases are clear of an acute process. Minimal streaky bibasilar atelectasis. The heart is normal in size. No pericardial effusion. Hepatobiliary: No hepatic lesions are identified without contrast. No intrahepatic biliary dilatation. Small layering gallstones suspected in the gallbladder. No findings for acute cholecystitis. No common bile duct dilatation. Pancreas: Diffuse inflammation/edema in and around the pancreas consistent with diffuse interstitial pancreatitis. Spleen: Stable mild splenomegaly. Adrenals/Urinary Tract: Adrenal glands and kidneys are. Bladder is grossly normal. Stomach/Bowel: The stomach, duodenum, small bowel and colon are grossly no. Vascular/Lymphatic: Moderate atherosclerotic calcification involving  the aorta but no aneurysm. Stable scattered mesenteric and retroperitoneal lymph nodes and progressive peritoneal carcinomatosis. Numerous omental and peritoneal implants slightly progressive when compared to the prior study. The soft tissue lesion in the right lower quadrant adjacent to the ascending colon measures 2.6 x 2.1 cm and previously measured 2.5 x 2.0 cm. Multiple pelvic implants are grossly stable to slightly enlarged. Reproductive: Surgically absent. Other: Small amount of free pelvic fluid noted. Musculoskeletal: No significant bony findings. IMPRESSION: 1. 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 2. Slight interval progression of peritoneal carcinomatosis. Aortic Atherosclerosis (ICD10-I70.0). Electronically Signed   By: Marijo Sanes M.D.   On: 08/05/2022 14:20     LOS: 2 days   Antonieta Pert, MD [facility]  08/07/2022, 9:51 AM

## 2022-08-07 NOTE — Progress Notes (Signed)
Kayla Baker   DOB:[birth year]   JS#:[id number]    ASSESSMENT & PLAN:   Acute pancreatitis This is the most likely source of her abdominal pain, loss of appetite and loose stool Overall, she is better I recommend trial of clear liquid diet today and reduce IV fluid support   Recurrent ovarian cancer She received first dose of Taxotere recently, complicated by facial rash and loss of appetite The chemotherapy itself is not the direct cause of pancreatitis but overall, she is quite weak due to pancreatitis Continue supportive care for now   Acquired pancytopenia Due to recent treatment She has chronic pancytopenia at baseline due to chronic bone marrow suppression from treatment She does not need blood transfusion today but I anticipate it might get worse over the next few days Monitor closely No need G-CSF, her ANC has improved back to normal range   Acute on chronic renal failure, resolving Likely due to poor oral intake and dehydration as well as recent diarrhea Agree with IV fluid resuscitation Trial of liquid diet and reduce IV fluids today  Urinary tract infection Like the the source of her recent fever I recommend tailoring her antibiotics accordingly   Hypomagnesemia Replace accordingly per primary service   Code Status Full code   Goals of care Resolution of pancreatitis and pancytopenia   Discharge planning I anticipate she will be here for a few more days, hopefully discharge this weekend  All questions were answered. The patient knows to call the clinic with any problems, questions or concerns.   The total time spent in the appointment was 40 minutes encounter with patients including review of chart and various tests results, discussions about plan of care and coordination of care plan  Heath Lark, MD 08/07/2022 9:36 AM  Subjective:  She is feeling better.  Her husband is by the bedside.  She denies nausea, pain or further loose stool She was noted to have  intermittent borderline fever this past few days  Objective:  Vitals:   08/06/22 2135 08/07/22 0614  BP: (!) 102/52 (!) 115/46  Pulse: 76 70  Resp: 18   Temp: (!) 97.5 F (36.4 C) 98 F (36.7 C)  SpO2: 96% 90%     Intake/Output Summary (Last 24 hours) at 08/07/2022 0936 Last data filed at 08/06/2022 1600 Gross per 24 hour  Intake 2053.59 ml  Output --  Net 2053.59 ml    GENERAL:alert, no distress and comfortable SKIN: skin color, texture, turgor are normal, no rashes or significant lesions EYES: normal, Conjunctiva are pink and non-injected, sclera clear OROPHARYNX:no exudate, no erythema and lips, buccal mucosa, and tongue normal  NECK: supple, thyroid normal size, non-tender, without nodularity LYMPH:  no palpable lymphadenopathy in the cervical, axillary or inguinal LUNGS: clear to auscultation and percussion with normal breathing effort HEART: regular rate & rhythm and no murmurs and no lower extremity edema ABDOMEN:abdomen soft, non-tender and normal bowel sounds Musculoskeletal:no cyanosis of digits and no clubbing  NEURO: alert & oriented x 3 with fluent speech, no focal motor/sensory deficits   Labs:  Recent Labs    07/29/22 0953 08/05/22 1019 08/06/22 0521 08/07/22 0534  NA 135 136 139  --   K 3.5 3.9 3.8  --   CL 101 100 107  --   CO2 27 25 24   --   GLUCOSE 208* 131* 124*  --   BUN 42* 38* 33*  --   CREATININE 1.42* 1.74* 1.45* 1.44*  CALCIUM 9.7 8.6* 8.3*  --  GFRNONAA 36* 29* 36* 36*  PROT 6.7 6.3* 5.6*  --   ALBUMIN 3.3* 2.7* 2.4*  --   AST 35 38 30  --   ALT 19 30 26   --   ALKPHOS 117 75 65  --   BILITOT 0.6 1.9* 1.9*  --     Studies:  CT ABDOMEN PELVIS WO CONTRAST  Result Date: 08/05/2022 CLINICAL DATA:  Left-sided abdominal pain. History of ovarian cancer. * Tracking Code: BO * EXAM: CT ABDOMEN AND PELVIS WITHOUT CONTRAST TECHNIQUE: Multidetector CT imaging of the abdomen and pelvis was performed following the standard protocol without IV  contrast. RADIATION DOSE REDUCTION: This exam was performed according to the departmental dose-optimization program which includes automated exposure control, adjustment of the mA and/or kV according to patient size and/or use of iterative reconstruction technique. COMPARISON:  06/11/2022 FINDINGS: Lower chest: The lung bases are clear of an acute process. Minimal streaky bibasilar atelectasis. The heart is normal in size. No pericardial effusion. Hepatobiliary: No hepatic lesions are identified without contrast. No intrahepatic biliary dilatation. Small layering gallstones suspected in the gallbladder. No findings for acute cholecystitis. No common bile duct dilatation. Pancreas: Diffuse inflammation/edema in and around the pancreas consistent with diffuse interstitial pancreatitis. Spleen: Stable mild splenomegaly. Adrenals/Urinary Tract: Adrenal glands and kidneys are. Bladder is grossly normal. Stomach/Bowel: The stomach, duodenum, small bowel and colon are grossly no. Vascular/Lymphatic: Moderate atherosclerotic calcification involving the aorta but no aneurysm. Stable scattered mesenteric and retroperitoneal lymph nodes and progressive peritoneal carcinomatosis. Numerous omental and peritoneal implants slightly progressive when compared to the prior study. The soft tissue lesion in the right lower quadrant adjacent to the ascending colon measures 2.6 x 2.1 cm and previously measured 2.5 x 2.0 cm. Multiple pelvic implants are grossly stable to slightly enlarged. Reproductive: Surgically absent. Other: Small amount of free pelvic fluid noted. Musculoskeletal: No significant bony findings. IMPRESSION: 1. 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 2. Slight interval progression of peritoneal carcinomatosis. Aortic Atherosclerosis (ICD10-I70.0). Electronically Signed   By: Marijo Sanes M.D.   On: 08/05/2022 14:20   DG Chest Port 1 View  Result Date: 08/05/2022 CLINICAL DATA:  Weakness with  decreased p.o. intake. EXAM: PORTABLE CHEST 1 VIEW COMPARISON:  November 07, 2021 FINDINGS: EKG leads project over the chest. RIGHT-sided Port-A-Cath terminating at the caval to atrial junction. Mild rotation to the RIGHT. Accounting for this cardiomediastinal contours and hilar structures are normal. Lungs are clear.  No sign of effusion.  No pneumothorax. Post LEFT breast lumpectomy. On limited assessment no acute skeletal findings. IMPRESSION: 1. No acute cardiopulmonary disease. Electronically Signed   By: Zetta Bills M.D.   On: 08/05/2022 09:59

## 2022-08-08 DIAGNOSIS — R531 Weakness: Secondary | ICD-10-CM

## 2022-08-08 DIAGNOSIS — R1032 Left lower quadrant pain: Secondary | ICD-10-CM | POA: Diagnosis not present

## 2022-08-08 DIAGNOSIS — C569 Malignant neoplasm of unspecified ovary: Secondary | ICD-10-CM

## 2022-08-08 LAB — BASIC METABOLIC PANEL
Anion gap: 8 (ref 5–15)
BUN: 35 mg/dL — ABNORMAL HIGH (ref 8–23)
CO2: 22 mmol/L (ref 22–32)
Calcium: 8.2 mg/dL — ABNORMAL LOW (ref 8.9–10.3)
Chloride: 105 mmol/L (ref 98–111)
Creatinine, Ser: 1.75 mg/dL — ABNORMAL HIGH (ref 0.44–1.00)
GFR, Estimated: 28 mL/min — ABNORMAL LOW (ref >60)
Glucose, Bld: 173 mg/dL — ABNORMAL HIGH (ref 70–99)
Potassium: 3.1 mmol/L — ABNORMAL LOW (ref 3.5–5.1)
Sodium: 135 mmol/L (ref 135–145)

## 2022-08-08 LAB — CBC WITH DIFFERENTIAL/PLATELET
Abs Immature Granulocytes: 0.24 10*3/uL — ABNORMAL HIGH (ref 0.00–0.07)
Basophils Absolute: 0.1 10*3/uL (ref 0.0–0.1)
Basophils Relative: 1 %
Eosinophils Absolute: 0 10*3/uL (ref 0.0–0.5)
Eosinophils Relative: 0 %
HCT: 26 % — ABNORMAL LOW (ref 36.0–46.0)
Hemoglobin: 8.7 g/dL — ABNORMAL LOW (ref 12.0–15.0)
Immature Granulocytes: 4 %
Lymphocytes Relative: 16 %
Lymphs Abs: 0.9 10*3/uL (ref 0.7–4.0)
MCH: 32 pg (ref 26.0–34.0)
MCHC: 33.5 g/dL (ref 30.0–36.0)
MCV: 95.6 fL (ref 80.0–100.0)
Monocytes Absolute: 0.6 10*3/uL (ref 0.1–1.0)
Monocytes Relative: 10 %
Neutro Abs: 3.8 10*3/uL (ref 1.7–7.7)
Neutrophils Relative %: 69 %
Platelets: 88 10*3/uL — ABNORMAL LOW (ref 150–400)
RBC: 2.72 MIL/uL — ABNORMAL LOW (ref 3.87–5.11)
RDW: 16.1 % — ABNORMAL HIGH (ref 11.5–15.5)
WBC: 5.6 10*3/uL (ref 4.0–10.5)
nRBC: 0.5 % — ABNORMAL HIGH (ref 0.0–0.2)

## 2022-08-08 LAB — GLUCOSE, CAPILLARY
Glucose-Capillary: 83 mg/dL (ref 70–99)
Glucose-Capillary: 117 mg/dL — ABNORMAL HIGH (ref 70–99)
Glucose-Capillary: 129 mg/dL — ABNORMAL HIGH (ref 70–99)
Glucose-Capillary: 148 mg/dL — ABNORMAL HIGH (ref 70–99)

## 2022-08-08 LAB — COMPREHENSIVE METABOLIC PANEL
ALT: 23 U/L (ref 0–44)
AST: 36 U/L (ref 15–41)
Albumin: 2 g/dL — ABNORMAL LOW (ref 3.5–5.0)
Alkaline Phosphatase: 75 U/L (ref 38–126)
Anion gap: 6 (ref 5–15)
BUN: 31 mg/dL — ABNORMAL HIGH (ref 8–23)
CO2: 25 mmol/L (ref 22–32)
Calcium: 8.4 mg/dL — ABNORMAL LOW (ref 8.9–10.3)
Chloride: 108 mmol/L (ref 98–111)
Creatinine, Ser: 1.62 mg/dL — ABNORMAL HIGH (ref 0.44–1.00)
GFR, Estimated: 31 mL/min — ABNORMAL LOW (ref >60)
Glucose, Bld: 87 mg/dL (ref 70–99)
Potassium: 3.1 mmol/L — ABNORMAL LOW (ref 3.5–5.1)
Sodium: 139 mmol/L (ref 135–145)
Total Bilirubin: 1 mg/dL (ref 0.3–1.2)
Total Protein: 4.8 g/dL — ABNORMAL LOW (ref 6.5–8.1)

## 2022-08-08 LAB — LIPASE, BLOOD: Lipase: 27 U/L (ref 11–51)

## 2022-08-08 MED ORDER — POTASSIUM CHLORIDE CRYS ER 20 MEQ PO TBCR
40.0000 meq | EXTENDED_RELEASE_TABLET | Freq: Once | ORAL | Status: AC
  Administered 2022-08-08: 40 meq via ORAL
  Filled 2022-08-08: qty 2

## 2022-08-08 MED ORDER — SODIUM CHLORIDE 0.9 % IV SOLN: INTRAVENOUS | Status: DC

## 2022-08-08 MED ORDER — POTASSIUM CHLORIDE CRYS ER 20 MEQ PO TBCR
40.0000 meq | EXTENDED_RELEASE_TABLET | ORAL | Status: AC
Start: 2022-08-08 — End: 2022-08-08
  Administered 2022-08-08 (×2): 40 meq via ORAL
  Filled 2022-08-08 (×2): qty 2

## 2022-08-08 MED ORDER — POTASSIUM CHLORIDE IN NACL 20-0.9 MEQ/L-% IV SOLN
INTRAVENOUS | Status: DC
  Filled 2022-08-08 (×2): qty 1000

## 2022-08-08 MED ORDER — AMOXICILLIN 250 MG PO CAPS
500.0000 mg | ORAL_CAPSULE | Freq: Two times a day (BID) | ORAL | Status: DC
  Administered 2022-08-08 – 2022-08-09 (×2): 500 mg via ORAL
  Filled 2022-08-08 (×2): qty 2

## 2022-08-08 NOTE — Progress Notes (Signed)
PROGRESS NOTE Kayla Baker  SNK:[id number] DOB: 31-Jul-1936 DOA: 08/05/2022 PCP: Kayla Pinto, MD   Brief Narrative/Hospital Course: 86 y.o. female with medical history significant of ovarian cancer, HTN, CKD3b, DM2. Presenting with abdominal pain and weakness.had chemo session on 07/29/22. In ED she's neutropenic, Kayla Baker was consulted and admitted.  Onco advised no prophylatic abx until she spikes a fever, per history she had diarrhea and abdominal pain. In ED- CXR neg, further got ct abd pelvis w/o- "consistent with diffuse interstitial pancreatitis,slight interval progression of peritoneal carcinomatosis". Patient spiked temp upto 102.6, wbc at 0.5. UA w/ cb 11-20, urine cx and blood cx ordered.  Patient was conservatively treated with IV fluid hydration, IV antibiotics-suspected to have UTI had febrile episode with leukopenia.  At this time WBC count has normalized platelet count improving, creatinine holding at 1.4-1.6 range also had hypokalemia replaced 08/08/22.  At this time tolerating regular diet  Urine culture with E. coli which is pansensitive.    Subjective: Seen and examined feeling much better Tolerating regular diet, creatinine uptrending potassium low despite replacement  Assessment and Plan:  Neutropenic fever E. coli UTI: Continue current antibiotics , we will switch to PO. Has been afebrile now.  blood culture NGTD  Acute pancreatitis: Unclear etiology she is on HCTZ will be discontinued as potential etiology.  CT w/ dissuse interstitial pancreatitis, continue current conservative management IV fluids antiemetics pain medication diet as tolerated-tolerating regular diet  AKI on CKD stage IIIb: Baseline creatinine around 1.1 in July.  Creatinine uptrending, will continue labs with hydration.  We will discontinue Zosyn switched to p.o. Recent Labs  Lab 08/05/22 1019 08/06/22 0521 08/07/22 0534 08/08/22 0509 08/08/22 1419  BUN 38* 33*  --  31* 35*  CREATININE 1.74* 1.45*  1.44* 1.62* 1.75*    Acquired pancytopenia: Due to recent treatment, has chronic pancytopenia at baseline due to chronic bone marrow suppression from treatment.  WBC count and platelet counts are improving, hemoglobin 8 to 9 g.  Monitor  Recent Labs  Lab 08/06/22 0521 08/07/22 0832 08/08/22 0509  HGB 8.6* 9.3* 8.7*  HCT 26.1* 28.4* 26.0*  WBC 0.5* 2.8* 5.6  PLT 53* 78* 88*    Type 2 diabetes mellitus blood sugar well controlled continue sliding scale insulin.  Holding p.o. meds Recent Labs  Lab 08/07/22 1209 08/07/22 1741 08/07/22 2109 08/08/22 0736 08/08/22 1128  GLUCAP 174* 111* 97 83 117*    Hypokalemia: Potassium is still low despite replacement add potassium chloride on IV fluids repeat 40 mEq KCl orally.  Repeat BMP in AM. Hypomagnesemia replaced Recurrent ovarian cancer followed by Kayla Baker Hypertension: BP stable.  Holding antihypertensives Hyperlipidemia holding meds for now  DVT prophylaxis: SCDs Start: 08/05/22 1523 Code Status:   Code Status: Full Code Family Communication: plan of care discussed with patient and her daughter at bedside. Patient status is: Inpatient because of acute pancreatitis/neutropenia Level of care: Telemetry   Dispo: The patient is from: Home            Anticipated disposition: Home over the weekend  Mobility Assessment (last 72 hours)     Mobility Assessment     Row Name 08/05/22 1601           Does patient have an order for bedrest or is patient medically unstable No - Continue assessment       What is the highest level of mobility based on the progressive mobility assessment? Level 3 (Stands with assist) - Balance while standing  and  cannot march in place       Is the above level different from baseline mobility prior to current illness? Yes - Recommend PT order                 Objective: Vitals last 24 hrs: Vitals:   08/08/22 0502 08/08/22 0959 08/08/22 1022 08/08/22 1418  BP: (!) 133/52 (!) 156/85 (!) 144/84 (!)  117/52  Pulse: 81 (!) 101 88 87  Resp: 14  18 18   Temp: 98 F (36.7 C)  (!) 97.5 F (36.4 C) 98 F (36.7 C)  TempSrc: Oral  Oral Oral  SpO2: 95%  100% 97%  Weight:      Height:       Weight change:   Physical Examination: General exam: AAOX3, older than stated age, weak appearing. HEENT:Oral mucosa moist, Ear/Nose WNL grossly, dentition normal. Respiratory system: bilaterally clear,no use of accessory muscle Cardiovascular system: S1 & S2 +, No JVD,. Gastrointestinal system: Abdomen soft,NT,ND,BS+ Nervous System:Alert, awake, moving extremities and grossly nonfocal Extremities: LE ankle edema NEG, distal peripheral pulses palpable.  Skin: No rashes,no icterus. MSK: Normal muscle bulk,tone, power   Medications reviewed:  Scheduled Meds:  allopurinol  100 mg Oral Daily   Chlorhexidine Gluconate Cloth  6 each Topical Daily   ezetimibe  10 mg Oral Daily   gabapentin  200 mg Oral QID   insulin aspart  0-9 Units Subcutaneous TID WC   metoprolol succinate  25 mg Oral Daily   potassium chloride  40 mEq Oral Once   rosuvastatin  5 mg Oral Q M,W,F   Continuous Infusions:  0.9 % NaCl with KCl 20 mEq / L     piperacillin-tazobactam (ZOSYN)  IV 3.375 g (08/08/22 1002)      Diet Order             Diet regular Room service appropriate? Yes; Fluid consistency: Thin  Diet effective now                   Intake/Output Summary (Last 24 hours) at 08/08/2022 1544 Last data filed at 08/08/2022 1300 Gross per 24 hour  Intake 990.01 ml  Output --  Net 990.01 ml   Net IO Since Admission: 3,799.13 mL [08/08/22 1544]  Wt Readings from Last 3 Encounters:  08/05/22 63.5 kg  07/29/22 64.9 kg  07/25/22 63.6 kg     Unresulted Labs (From admission, onward)     Start     Ordered   08/08/22 0500  Comprehensive metabolic panel  Daily at 5am,   R      08/07/22 0952   08/07/22 0736  CBC with Differential/Platelet  Daily at 5am,   R      08/07/22 0735          Data Reviewed: I  have personally reviewed following labs and imaging studies CBC: Recent Labs  Lab 08/05/22 1019 08/06/22 0521 08/07/22 0832 08/08/22 0509  WBC 0.7* 0.5* 2.8* 5.6  NEUTROABS  --   --  1.8 3.8  HGB 9.3* 8.6* 9.3* 8.7*  HCT 28.1* 26.1* 28.4* 26.0*  MCV 95.6 95.6 95.9 95.6  PLT 54* 53* 78* 88*   Basic Metabolic Panel: Recent Labs  Lab 08/05/22 1019 08/06/22 0521 08/07/22 0534 08/08/22 0509 08/08/22 1419  NA 136 139  --  139 135  K 3.9 3.8  --  3.1* 3.1*  CL 100 107  --  108 105  CO2 25 24  --  25 22  GLUCOSE  131* 124*  --  87 173*  BUN 38* 33*  --  31* 35*  CREATININE 1.74* 1.45* 1.44* 1.62* 1.75*  CALCIUM 8.6* 8.3*  --  8.4* 8.2*  MG 1.4*  --   --   --   --   PHOS 3.2  --   --   --   --    GFR: Estimated Creatinine Clearance: 20.7 mL/min (A) (by C-G formula based on SCr of 1.75 mg/dL (H)). Liver Function Tests: Recent Labs  Lab 08/05/22 1019 08/06/22 0521 08/08/22 0509  AST 38 30 36  ALT 30 26 23   ALKPHOS 75 65 75  BILITOT 1.9* 1.9* 1.0  PROT 6.3* 5.6* 4.8*  ALBUMIN 2.7* 2.4* 2.0*   Recent Labs  Lab 08/07/22 0534 08/08/22 0509  LIPASE 37 27   No results for input(s): "AMMONIA" in the last 168 hours. Coagulation Profile: No results for input(s): "INR", "PROTIME" in the last 168 hours. BNP (last 3 results) No results for input(s): "PROBNP" in the last 8760 hours. HbA1C: No results for input(s): "HGBA1C" in the last 72 hours. CBG: Recent Labs  Lab 08/07/22 1209 08/07/22 1741 08/07/22 2109 08/08/22 0736 08/08/22 1128  GLUCAP 174* 111* 97 83 117*   Lipid Profile: No results for input(s): "CHOL", "HDL", "LDLCALC", "TRIG", "CHOLHDL", "LDLDIRECT" in the last 72 hours. Thyroid Function Tests: No results for input(s): "TSH", "T4TOTAL", "FREET4", "T3FREE", "THYROIDAB" in the last 72 hours. Sepsis Labs: No results for input(s): "PROCALCITON", "LATICACIDVEN" in the last 168 hours.  Recent Results (from the past 240 hour(s))  Culture, blood (Routine X 2)  w Reflex to ID Panel     Status: None (Preliminary result)   Collection Time: 08/05/22 12:49 PM   Specimen: BLOOD  Result Value Ref Range Status   Specimen Description   Final    BLOOD RIGHT ANTECUBITAL Performed at [facility] [address] [place], [place] [address]    Special Requests   Final    BOTTLES DRAWN AEROBIC AND ANAEROBIC Blood Culture adequate volume Performed at [facility] [address] [place], [place] [address]    Culture   Final    NO GROWTH 3 DAYS Performed at [facility], [place] [address] [place], [place] [address]    Report Status PENDING  Incomplete  Culture, blood (Routine X 2) w Reflex to ID Panel     Status: None (Preliminary result)   Collection Time: 08/05/22  1:15 PM   Specimen: BLOOD  Result Value Ref Range Status   Specimen Description   Final    BLOOD LEFT ANTECUBITAL Performed at [facility] [address], [place], [place] [address]    Special Requests   Final    BOTTLES DRAWN AEROBIC AND ANAEROBIC Blood Culture adequate volume Performed at [facility] [address] [place], [place] [address]    Culture   Final    NO GROWTH 3 DAYS Performed at [facility], [place] [address] [place], [place] [address]    Report Status PENDING  Incomplete  Urine Culture     Status: Abnormal   Collection Time: 08/05/22  5:18 PM   Specimen: In/Out Cath Urine  Result Value Ref Range Status   Specimen Description   Final    IN/OUT CATH URINE Performed at [facility] [address], [place],  [address]    Special Requests   Final    NONE Performed at [facility], [place] [address], [place], [place] [address]    Culture >=100,000 COLONIES/mL ESCHERICHIA COLI (A)  Final   Report Status 08/07/2022 FINAL  Final   Organism ID, Bacteria ESCHERICHIA COLI (A)  Final      Susceptibility   Escherichia coli - MIC*     AMPICILLIN <=2 SENSITIVE Sensitive     CEFAZOLIN <=4 SENSITIVE Sensitive     CEFEPIME <=0.12 SENSITIVE Sensitive     CEFTRIAXONE <=0.25 SENSITIVE Sensitive     CIPROFLOXACIN <=0.25 SENSITIVE Sensitive     GENTAMICIN <=1 SENSITIVE Sensitive     IMIPENEM <=0.25 SENSITIVE Sensitive     NITROFURANTOIN <=16 SENSITIVE Sensitive     TRIMETH/SULFA <=20 SENSITIVE Sensitive     AMPICILLIN/SULBACTAM <=2 SENSITIVE Sensitive     PIP/TAZO <=4 SENSITIVE Sensitive     * >=100,000 COLONIES/mL ESCHERICHIA COLI    Antimicrobials: Anti-infectives (From admission, onward)    Start     Dose/Rate Route Frequency Ordered Stop   08/06/22 1800  piperacillin-tazobactam (ZOSYN) IVPB 3.375 g        3.375 g 12.5 mL/hr over 240 Minutes Intravenous Every 8 hours 08/06/22 0948     08/06/22 1500  piperacillin-tazobactam (ZOSYN) IVPB 3.375 g  Status:  Discontinued        3.375 g 12.5 mL/hr over 240 Minutes Intravenous Every 8 hours 08/06/22 0846 08/06/22 0948   08/06/22 0930  piperacillin-tazobactam (ZOSYN) IVPB 3.375 g        3.375 g 100 mL/hr over 30 Minutes Intravenous  Once 08/06/22 0844 08/07/22 1825      Culture/Microbiology    Component Value Date/Time   SDES  08/05/2022 1718    IN/OUT CATH URINE Performed at [facility], [place] [address], [place], [place] [address]    SPECREQUEST  08/05/2022 1718    NONE Performed at [facility], [place] [address], [place],  [address]    CULT >=100,000 COLONIES/mL ESCHERICHIA COLI (A) 08/05/2022 1718   REPTSTATUS 08/07/2022 FINAL 08/05/2022 1718    Other culture-see note  Radiology Studies: No results found.   LOS: 3 days   Antonieta Pert, MD [facility]  08/08/2022, 3:44 PM

## 2022-08-08 NOTE — Progress Notes (Signed)
Nurse and daughter asked patient if she wanted to shower. Patient stated she would shower when she goes home. Will continue to monitor.

## 2022-08-08 NOTE — Progress Notes (Signed)
Due to electrolytes and creatine, patient will not be d/c home today. Primary nurse notified daughter and significant other at bedside. Will continue to monitor.

## 2022-08-08 NOTE — Progress Notes (Signed)
Kayla Baker   DOB:[birth year]   GY#:[id number]    ASSESSMENT & PLAN:  Acute pancreatitis This is the most likely source of her abdominal pain, loss of appetite and loose stool Overall, she is better Advancing diet to regular and discontinue IV fluids   Recurrent ovarian cancer She received first dose of Taxotere recently, complicated by facial rash and loss of appetite The chemotherapy itself is not the direct cause of pancreatitis but overall, she is quite weak due to pancreatitis Continue supportive care for now I will address the plan for treatment in the future   Acquired pancytopenia Due to recent treatment No need G-CSF, her ANC has improved back to normal range   Acute on chronic renal failure, resolving Likely due to poor oral intake and dehydration as well as recent diarrhea Advancing to regular diet  Urinary tract infection Like the the source of her recent fever I recommend tailoring a few more days of antibiotics upon discharge    Code Status Full code   Goals of care Goals of care is met   Discharge planning I recommend discharge today if she tolerates lunch without difficulties She has appointment to see me in the outpatient clinic I addressed all her questions and concerns  All questions were answered. The patient knows to call the clinic with any problems, questions or concerns.   The total time spent in the appointment was 35 minutes encounter with patients including review of chart and various tests results, discussions about plan of care and coordination of care plan  Heath Lark, MD 08/08/2022 7:56 AM  Subjective:  She is feeling much better.  She was able to tolerate clear liquid diet.  She has been afebrile.  Her daughter is by the bedside I have addressed all the questions  Objective:  Vitals:   08/07/22 2107 08/08/22 0502  BP: (!) 126/48 (!) 133/52  Pulse: 91 81  Resp: 16 14  Temp: 98.3 F (36.8 C) 98 F (36.7 C)  SpO2: 94% 95%      Intake/Output Summary (Last 24 hours) at 08/08/2022 0756 Last data filed at 08/07/2022 1600 Gross per 24 hour  Intake 1847.01 ml  Output --  Net 1847.01 ml    GENERAL:alert, no distress and comfortable NEURO: alert & oriented x 3 with fluent speech, no focal motor/sensory deficits   Labs:  Recent Labs    08/05/22 1019 08/06/22 0521 08/07/22 0534 08/08/22 0509  NA 136 139  --  139  K 3.9 3.8  --  3.1*  CL 100 107  --  108  CO2 25 24  --  25  GLUCOSE 131* 124*  --  87  BUN 38* 33*  --  31*  CREATININE 1.74* 1.45* 1.44* 1.62*  CALCIUM 8.6* 8.3*  --  8.4*  GFRNONAA 29* 36* 36* 31*  PROT 6.3* 5.6*  --  4.8*  ALBUMIN 2.7* 2.4*  --  2.0*  AST 38 30  --  36  ALT 30 26  --  23  ALKPHOS 75 65  --  75  BILITOT 1.9* 1.9*  --  1.0    Studies:  CT ABDOMEN PELVIS WO CONTRAST  Result Date: 08/05/2022 CLINICAL DATA:  Left-sided abdominal pain. History of ovarian cancer. * Tracking Code: BO * EXAM: CT ABDOMEN AND PELVIS WITHOUT CONTRAST TECHNIQUE: Multidetector CT imaging of the abdomen and pelvis was performed following the standard protocol without IV contrast. RADIATION DOSE REDUCTION: This exam was performed according to the departmental dose-optimization  program which includes automated exposure control, adjustment of the mA and/or kV according to patient size and/or use of iterative reconstruction technique. COMPARISON:  06/11/2022 FINDINGS: Lower chest: The lung bases are clear of an acute process. Minimal streaky bibasilar atelectasis. The heart is normal in size. No pericardial effusion. Hepatobiliary: No hepatic lesions are identified without contrast. No intrahepatic biliary dilatation. Small layering gallstones suspected in the gallbladder. No findings for acute cholecystitis. No common bile duct dilatation. Pancreas: Diffuse inflammation/edema in and around the pancreas consistent with diffuse interstitial pancreatitis. Spleen: Stable mild splenomegaly. Adrenals/Urinary Tract: Adrenal  glands and kidneys are. Bladder is grossly normal. Stomach/Bowel: The stomach, duodenum, small bowel and colon are grossly no. Vascular/Lymphatic: Moderate atherosclerotic calcification involving the aorta but no aneurysm. Stable scattered mesenteric and retroperitoneal lymph nodes and progressive peritoneal carcinomatosis. Numerous omental and peritoneal implants slightly progressive when compared to the prior study. The soft tissue lesion in the right lower quadrant adjacent to the ascending colon measures 2.6 x 2.1 cm and previously measured 2.5 x 2.0 cm. Multiple pelvic implants are grossly stable to slightly enlarged. Reproductive: Surgically absent. Other: Small amount of free pelvic fluid noted. Musculoskeletal: No significant bony findings. IMPRESSION: 1. 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 2. Slight interval progression of peritoneal carcinomatosis. Aortic Atherosclerosis (ICD10-I70.0). Electronically Signed   By: Marijo Sanes M.D.   On: 08/05/2022 14:20   DG Chest Port 1 View  Result Date: 08/05/2022 CLINICAL DATA:  Weakness with decreased p.o. intake. EXAM: PORTABLE CHEST 1 VIEW COMPARISON:  November 07, 2021 FINDINGS: EKG leads project over the chest. RIGHT-sided Port-A-Cath terminating at the caval to atrial junction. Mild rotation to the RIGHT. Accounting for this cardiomediastinal contours and hilar structures are normal. Lungs are clear.  No sign of effusion.  No pneumothorax. Post LEFT breast lumpectomy. On limited assessment no acute skeletal findings. IMPRESSION: 1. No acute cardiopulmonary disease. Electronically Signed   By: Zetta Bills M.D.   On: 08/05/2022 09:59

## 2022-08-09 DIAGNOSIS — K8500 Idiopathic acute pancreatitis without necrosis or infection: Secondary | ICD-10-CM

## 2022-08-09 DIAGNOSIS — K859 Acute pancreatitis without necrosis or infection, unspecified: Secondary | ICD-10-CM

## 2022-08-09 LAB — BASIC METABOLIC PANEL
Anion gap: 4 — ABNORMAL LOW (ref 5–15)
BUN: 28 mg/dL — ABNORMAL HIGH (ref 8–23)
CO2: 21 mmol/L — ABNORMAL LOW (ref 22–32)
Calcium: 8 mg/dL — ABNORMAL LOW (ref 8.9–10.3)
Chloride: 113 mmol/L — ABNORMAL HIGH (ref 98–111)
Creatinine, Ser: 1.45 mg/dL — ABNORMAL HIGH (ref 0.44–1.00)
GFR, Estimated: 36 mL/min — ABNORMAL LOW (ref >60)
Glucose, Bld: 101 mg/dL — ABNORMAL HIGH (ref 70–99)
Potassium: 4 mmol/L (ref 3.5–5.1)
Sodium: 138 mmol/L (ref 135–145)

## 2022-08-09 LAB — CBC WITH DIFFERENTIAL/PLATELET
Abs Immature Granulocytes: 0.31 10*3/uL — ABNORMAL HIGH (ref 0.00–0.07)
Basophils Absolute: 0 10*3/uL (ref 0.0–0.1)
Basophils Relative: 0 %
Eosinophils Absolute: 0 10*3/uL (ref 0.0–0.5)
Eosinophils Relative: 0 %
HCT: 22.4 % — ABNORMAL LOW (ref 36.0–46.0)
Hemoglobin: 7.3 g/dL — ABNORMAL LOW (ref 12.0–15.0)
Immature Granulocytes: 7 %
Lymphocytes Relative: 14 %
Lymphs Abs: 0.6 10*3/uL — ABNORMAL LOW (ref 0.7–4.0)
MCH: 31.9 pg (ref 26.0–34.0)
MCHC: 32.6 g/dL (ref 30.0–36.0)
MCV: 97.8 fL (ref 80.0–100.0)
Monocytes Absolute: 0.5 10*3/uL (ref 0.1–1.0)
Monocytes Relative: 10 %
Neutro Abs: 3.3 10*3/uL (ref 1.7–7.7)
Neutrophils Relative %: 69 %
Platelets: 64 10*3/uL — ABNORMAL LOW (ref 150–400)
RBC: 2.29 MIL/uL — ABNORMAL LOW (ref 3.87–5.11)
RDW: 16.7 % — ABNORMAL HIGH (ref 11.5–15.5)
WBC: 4.7 10*3/uL (ref 4.0–10.5)
nRBC: 0.4 % — ABNORMAL HIGH (ref 0.0–0.2)

## 2022-08-09 LAB — HEMOGLOBIN AND HEMATOCRIT, BLOOD
HCT: 25.4 % — ABNORMAL LOW (ref 36.0–46.0)
Hemoglobin: 8.1 g/dL — ABNORMAL LOW (ref 12.0–15.0)

## 2022-08-09 LAB — GLUCOSE, CAPILLARY
Glucose-Capillary: 92 mg/dL (ref 70–99)
Glucose-Capillary: 141 mg/dL — ABNORMAL HIGH (ref 70–99)

## 2022-08-09 MED ORDER — HEPARIN SOD (PORK) LOCK FLUSH 100 UNIT/ML IV SOLN
500.0000 [IU] | Freq: Once | INTRAVENOUS | Status: AC
  Administered 2022-08-09: 500 [IU] via INTRAVENOUS
  Filled 2022-08-09: qty 5

## 2022-08-09 MED ORDER — AMOXICILLIN 500 MG PO CAPS: 500.0000 mg | ORAL_CAPSULE | Freq: Two times a day (BID) | ORAL | 0 refills | Status: AC

## 2022-08-09 NOTE — Progress Notes (Signed)
Patient discharged to home with family, discharge instructions reviewed with patient who verbalized understanding.

## 2022-08-09 NOTE — Discharge Summary (Addendum)
Physician Discharge Summary  Kayla Baker ZOX:[id number] DOB: 1936-05-20 DOA: 08/05/2022  PCP: [name], MD  Admit date: 08/05/2022 Discharge date: 08/09/2022 Recommendations for Outpatient Follow-up:  Follow up with PCP and oncolgy in 1 weeks-call for appointment  Discharge Dispo: Home Discharge Condition: Stable Code Status:   Code Status: Full Code Diet recommendation:  Diet Order             Diet regular Room service appropriate? Yes; Fluid consistency: Thin  Diet effective now                   Brief/Interim Summary: 86 y.o. female with medical history significant of ovarian cancer, HTN, CKD3b, DM2. Presenting with abdominal pain and weakness.had chemo session on 07/29/22. In ED she's neutropenic, [name] was consulted and admitted.  Onco advised no prophylatic abx until she spikes a fever, per history she had diarrhea and abdominal pain. In ED- CXR neg, further got ct abd pelvis w/o- "consistent with diffuse interstitial pancreatitis,slight interval progression of peritoneal carcinomatosis". Patient spiked temp upto 102.6, wbc at 0.5. UA w/ cb 11-20, urine cx and blood cx ordered.  Patient was conservatively treated with IV fluid hydration, IV antibiotics-suspected to have UTI had febrile episode with leukopenia.  At this time WBC count has normalized platelet count improving, creatinine holding at 1.4-1.6 range also had hypokalemia replaced 08/08/22.  At this time tolerating regular diet  Urine culture with E. coli which is pansensitive.  Potassium has normalized creatinine improved with hydration overnight.  At this time she is tolerating diet, feels ready for home today.  She will follow-up with her oncology as scheduled next week.   Discharge Diagnoses:  Principal Problem:   Acute pancreatitis Active Problems:   Essential hypertension   Hyperlipidemia associated with type 2 diabetes mellitus (HCC)   DM2 (diabetes mellitus, type 2) (HCC)   Ovarian cancer (HCC)   Pancytopenia,  acquired ([place])   Hypomagnesemia   Abdominal pain   Stage 3b chronic kidney disease (CKD) (HCC)   Neutropenia (HCC)   AKI (acute kidney injury) ([place])   Generalized weakness  Neutropenic fever E. coli UTI: Clinically improved continue amoxicillin to complete the course.blood culture NGTD   Acute pancreatitis: Unclear etiology she is on HCTZ will be discontinued as potential etiology.  CT w/ dissuse interstitial pancreatitis, conservatively managed with IV fluids antiemetics pain medication diet as tolerated-tolerating regular diet-advised outpatient follow-up   AKI on CKD stage IIIb: Baseline creatinine around 1.1 in July.  Creatinine was uptrending, peaked 1.7 now downtrending 1.4 and likely her new baseline.  Encourage oral hydration. Recent Labs  Lab 08/05/22 1019 08/06/22 0521 08/07/22 0534 08/08/22 0509 08/08/22 1419 08/09/22 0625  BUN 38* 33*  --  31* 35* 28*  CREATININE 1.74* 1.45* 1.44* 1.62* 1.75* 1.45*      Acquired pancytopenia: Due to recent treatment, has chronic pancytopenia at baseline due to chronic bone marrow suppression from treatment.  WBC count and platelet counts are improving-stable, hemoglobin 8 to 9 g.  Monitor  Recent Labs  Lab 08/06/22 0521 08/07/22 0832 08/08/22 0509 08/09/22 0625 08/09/22 1002  HGB 8.6* 9.3* 8.7* 7.3* 8.1*  HCT 26.1* 28.4* 26.0* 22.4* 25.4*      Type 2 diabetes mellitus blood sugar well controlled continue sliding scale insulin.  Holding p.o. meds   Hypokalemia: Repleted and is stable  Hypomagnesemia replaced Recurrent ovarian cancer followed by Dr. [name] Hypertension: BP stable.  Resume home meds on discharge but discontinue HCTZ Hyperlipidemia holding meds for  now  Consults: Oncology Subjective: Alert awake oriented resting comfortably no more abdominal pain.  Tolerating diet.  She feels ready for discharge home today.  Discharge Exam: Vitals:   08/08/22 2057 08/09/22 0656  BP: 120/60 (!) 109/56  Pulse: 91 84   Resp: 16 14  Temp: 99.2 F (37.3 C) 98.5 F (36.9 C)  SpO2: 97% 97%   General: Pt is alert, awake, not in acute distress Cardiovascular: RRR, S1/S2 +, no rubs, no gallops Respiratory: CTA bilaterally, no wheezing, no rhonchi Abdominal: Soft, NT, ND, bowel sounds + Extremities: no edema, no cyanosis  Discharge Instructions  Discharge Instructions     Discharge instructions   Complete by: As directed    Follow-up with your oncology doctor in a week or so, check CBC BMP in a week. Please avoid hydrochlorothiazide as it may cause pancreatitis.  Please call call MD or return to ER for similar or worsening recurring problem that brought you to hospital or if any fever,nausea/vomiting,abdominal pain, uncontrolled pain, chest pain,  shortness of breath or any other alarming symptoms.  Please follow-up your doctor as instructed in a week time and call the office for appointment.  Please avoid alcohol, smoking, or any other illicit substance and maintain healthy habits including taking your regular medications as prescribed.  You were cared for by a hospitalist during your hospital stay. If you have any questions about your discharge medications or the care you received while you were in the hospital after you are discharged, you can call the unit and ask to speak with the hospitalist on call if the hospitalist that took care of you is not available.  Once you are discharged, your primary care physician will handle any further medical issues. Please note that NO REFILLS for any discharge medications will be authorized once you are discharged, as it is imperative that you return to your primary care physician (or establish a relationship with a primary care physician if you do not have one) for your aftercare needs so that they can reassess your need for medications and monitor your lab values   Increase activity slowly   Complete by: As directed       Allergies as of 08/09/2022        Reactions   Keflex [cephalexin] Swelling   Tongue and throat swells   Mobic [meloxicam] Swelling   Prednisone Swelling, Other (See Comments)   Can Not take High doses   Premarin [estrogens Conjugated] Hives   Trovan [alatrofloxacin] Other (See Comments)   Unknown reaction        Medication List     STOP taking these medications    hydrochlorothiazide 25 MG tablet Commonly known as: HYDRODIURIL       TAKE these medications    acetaminophen 325 MG tablet Commonly known as: TYLENOL Take 325 mg by mouth every 6 (six) hours as needed for headache.   allopurinol 100 MG tablet Commonly known as: ZYLOPRIM Take 1 tablet (100 mg total) by mouth daily. FOR GOUT PREVENTION, What changed: additional instructions   amoxicillin 500 MG tablet Commonly known as: AMOXIL Take 2,000 mg by mouth See admin instructions. Take prior to dental cleanings What changed: Another medication with the same name was added. Make sure you understand how and when to take each.   amoxicillin 500 MG capsule Commonly known as: AMOXIL Take 1 capsule (500 mg total) by mouth 2 (two) times daily for 7 days. What changed: You were already taking a medication with the  same name, and this prescription was added. Make sure you understand how and when to take each.   ascorbic acid 500 MG tablet Commonly known as: VITAMIN C Take 500 mg by mouth every evening.   cyanocobalamin 1000 MCG tablet Take 1,000 mcg by mouth daily.   dexamethasone 4 MG tablet Commonly known as: DECADRON Take 2 tablets the morning before chemotherapy and then 2 tablets daily for 2 days after chemo   ezetimibe 10 MG tablet Commonly known as: ZETIA TAKE 1 TABLET BY MOUTH DAILY FOR CHOLESTEROL What changed:  how much to take how to take this when to take this additional instructions   gabapentin 300 MG capsule Commonly known as: NEURONTIN Take 1 capsule 4 x  /day for Pain What changed:  how much to take how to take this when  to take this additional instructions   glimepiride 1 MG tablet Commonly known as: Amaryl Take 1 tab by mouth in the morning if fasting is running 150+. Please do not take this medication if you are ill or not eating as this can cause a low blood sugar. What changed:  how much to take how to take this when to take this additional instructions   lidocaine-prilocaine cream Commonly known as: EMLA Apply 1 application. topically daily as needed (for port access).   magnesium oxide 400 (240 Mg) MG tablet Commonly known as: MAG-OX Take 1 tablet by mouth daily.   metoprolol succinate 25 MG 24 hr tablet Commonly known as: TOPROL-XL Take  1 tablet  Daily  for BP       /TAKE 1 TABLET BY MOUTH DAILY FOR BLOOD PRESSURE What changed:  how much to take how to take this when to take this additional instructions   ondansetron 8 MG tablet Commonly known as: Zofran Take 1 tablet (8 mg total) by mouth every 8 (eight) hours as needed for refractory nausea / vomiting.   polyethylene glycol 17 g packet Commonly known as: MiraLax Take 17 g by mouth 2 (two) times daily. 17 grams in 6 oz of favorite drink twice a day until bowel movement.  LAXITIVE.  Restart if two days since last bowel movement What changed: additional instructions   rosuvastatin 5 MG tablet Commonly known as: CRESTOR TAKE 1 TABLET BY MOUTH IN THE EVENING THREE TIMES WEEKLY FOR CHOLESTEROL What changed: See the new instructions.   trolamine salicylate 10 % cream Commonly known as: ASPERCREME Apply 1 application topically as needed for muscle pain (Use if you still feel tingles in feet in the morning after taking Gabapentin).   Vitamin D3 125 MCG (5000 UT) Caps Take 5,000 Units by mouth daily.        Follow-up Information     [name], MD Follow up in 1 week(s).   Specialty: Internal Medicine Contact information: [address] [place] [address] [phone]         Heath Lark, MD  Follow up.   Specialty: Hematology and Oncology Why: As scheduled Contact information: [place] [address] [phone]                Allergies  Allergen Reactions   Keflex [Cephalexin] Swelling    Tongue and throat swells   Mobic [Meloxicam] Swelling   Prednisone Swelling and Other (See Comments)    Can Not take High doses   Premarin [Estrogens Conjugated] Hives   Trovan [Alatrofloxacin] Other (See Comments)    Unknown reaction    The results  of significant diagnostics from this hospitalization (including imaging, microbiology, ancillary and laboratory) are listed below for reference.    Microbiology: Recent Results (from the past 240 hour(s))  Culture, blood (Routine X 2) w Reflex to ID Panel     Status: None (Preliminary result)   Collection Time: 08/05/22 12:49 PM   Specimen: BLOOD  Result Value Ref Range Status   Specimen Description   Final    BLOOD RIGHT ANTECUBITAL Performed at [facility] [address] [place], [place] [address]    Special Requests   Final    BOTTLES DRAWN AEROBIC AND ANAEROBIC Blood Culture adequate volume Performed at [facility] [address], [place], [place] [address]    Culture   Final    NO GROWTH 4 DAYS Performed at [facility], [place] [address] [place], [place] [address]    Report Status PENDING  Incomplete  Culture, blood (Routine X 2) w Reflex to ID Panel     Status: None (Preliminary result)   Collection Time: 08/05/22  1:15 PM   Specimen: BLOOD  Result Value Ref Range Status   Specimen Description   Final    BLOOD LEFT ANTECUBITAL Performed at [facility] [address], [place], [place] [address]    Special Requests   Final    BOTTLES DRAWN AEROBIC AND ANAEROBIC Blood Culture adequate volume Performed at [facility] [address], [place], [place] [address]    Culture   Final    NO GROWTH 4  DAYS Performed at [facility], [place] [address], [place], [place] [address]    Report Status PENDING  Incomplete  Urine Culture     Status: Abnormal   Collection Time: 08/05/22  5:18 PM   Specimen: In/Out Cath Urine  Result Value Ref Range Status   Specimen Description   Final    IN/OUT CATH URINE Performed at [facility] [address] [place], [place] [address]    Special Requests   Final    NONE Performed at [facility], [place] [address] [place],  [address]    Culture >=100,000 COLONIES/mL ESCHERICHIA COLI (A)  Final   Report Status 08/07/2022 FINAL  Final   Organism ID, Bacteria ESCHERICHIA COLI (A)  Final      Susceptibility   Escherichia coli - MIC*    AMPICILLIN <=2 SENSITIVE Sensitive     CEFAZOLIN <=4 SENSITIVE Sensitive     CEFEPIME <=0.12 SENSITIVE Sensitive     CEFTRIAXONE <=0.25 SENSITIVE Sensitive     CIPROFLOXACIN <=0.25 SENSITIVE Sensitive     GENTAMICIN <=1 SENSITIVE Sensitive     IMIPENEM <=0.25 SENSITIVE Sensitive     NITROFURANTOIN <=16 SENSITIVE Sensitive     TRIMETH/SULFA <=20 SENSITIVE Sensitive     AMPICILLIN/SULBACTAM <=2 SENSITIVE Sensitive     PIP/TAZO <=4 SENSITIVE Sensitive     * >=100,000 COLONIES/mL ESCHERICHIA COLI    Procedures/Studies: CT ABDOMEN PELVIS WO CONTRAST  Result Date: 08/05/2022 CLINICAL DATA:  Left-sided abdominal pain. History of ovarian cancer. * Tracking Code: BO * EXAM: CT ABDOMEN AND PELVIS WITHOUT CONTRAST TECHNIQUE: Multidetector CT imaging of the abdomen and pelvis was performed following the standard protocol without IV contrast. RADIATION DOSE REDUCTION: This exam was performed according to the departmental dose-optimization program which includes automated exposure control, adjustment of the mA and/or kV according to patient size and/or use of iterative reconstruction technique. COMPARISON:  06/11/2022 FINDINGS: Lower chest:  The lung bases are clear of an acute process.  Minimal streaky bibasilar atelectasis. The heart is normal in size. No pericardial effusion. Hepatobiliary: No hepatic lesions are identified without contrast. No intrahepatic biliary dilatation. Small layering gallstones suspected in the gallbladder. No findings for acute cholecystitis. No common bile duct dilatation. Pancreas: Diffuse inflammation/edema in and around the pancreas consistent with diffuse interstitial pancreatitis. Spleen: Stable mild splenomegaly. Adrenals/Urinary Tract: Adrenal glands and kidneys are. Bladder is grossly normal. Stomach/Bowel: The stomach, duodenum, small bowel and colon are grossly no. Vascular/Lymphatic: Moderate atherosclerotic calcification involving the aorta but no aneurysm. Stable scattered mesenteric and retroperitoneal lymph nodes and progressive peritoneal carcinomatosis. Numerous omental and peritoneal implants slightly progressive when compared to the prior study. The soft tissue lesion in the right lower quadrant adjacent to the ascending colon measures 2.6 x 2.1 cm and previously measured 2.5 x 2.0 cm. Multiple pelvic implants are grossly stable to slightly enlarged. Reproductive: Surgically absent. Other: Small amount of free pelvic fluid noted. Musculoskeletal: No significant bony findings. IMPRESSION: 1. 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 2. Slight interval progression of peritoneal carcinomatosis. Aortic Atherosclerosis (ICD10-I70.0). Electronically Signed   By: Marijo Sanes M.D.   On: 08/05/2022 14:20   DG Chest Port 1 View  Result Date: 08/05/2022 CLINICAL DATA:  Weakness with decreased p.o. intake. EXAM: PORTABLE CHEST 1 VIEW COMPARISON:  November 07, 2021 FINDINGS: EKG leads project over the chest. RIGHT-sided Port-A-Cath terminating at the caval to atrial junction. Mild rotation to the RIGHT. Accounting for this cardiomediastinal contours and hilar structures are normal. Lungs are clear.  No sign of effusion.  No pneumothorax.  Post LEFT breast lumpectomy. On limited assessment no acute skeletal findings. IMPRESSION: 1. No acute cardiopulmonary disease. Electronically Signed   By: Zetta Bills M.D.   On: 08/05/2022 09:59    Labs: BNP (last 3 results) No results for input(s): "BNP" in the last 8760 hours. Basic Metabolic Panel: Recent Labs  Lab 08/05/22 1019 08/06/22 0521 08/07/22 0534 08/08/22 0509 08/08/22 1419 08/09/22 0625  NA 136 139  --  139 135 138  K 3.9 3.8  --  3.1* 3.1* 4.0  CL 100 107  --  108 105 113*  CO2 25 24  --  25 22 21*  GLUCOSE 131* 124*  --  87 173* 101*  BUN 38* 33*  --  31* 35* 28*  CREATININE 1.74* 1.45* 1.44* 1.62* 1.75* 1.45*  CALCIUM 8.6* 8.3*  --  8.4* 8.2* 8.0*  MG 1.4*  --   --   --   --   --   PHOS 3.2  --   --   --   --   --    Liver Function Tests: Recent Labs  Lab 08/05/22 1019 08/06/22 0521 08/08/22 0509  AST 38 30 36  ALT 30 26 23   ALKPHOS 75 65 75  BILITOT 1.9* 1.9* 1.0  PROT 6.3* 5.6* 4.8*  ALBUMIN 2.7* 2.4* 2.0*   Recent Labs  Lab 08/07/22 0534 08/08/22 0509  LIPASE 37 27   No results for input(s): "AMMONIA" in the last 168 hours. CBC: Recent Labs  Lab 08/05/22 1019 08/06/22 0521 08/07/22 0832 08/08/22 0509 08/09/22 0625 08/09/22 1002  WBC 0.7* 0.5* 2.8* 5.6 4.7  --   NEUTROABS  --   --  1.8 3.8 3.3  --   HGB 9.3* 8.6* 9.3* 8.7* 7.3* 8.1*  HCT 28.1* 26.1* 28.4* 26.0* 22.4* 25.4*  MCV 95.6 95.6 95.9 95.6 97.8  --  PLT 54* 53* 78* 88* 64*  --    Cardiac Enzymes: No results for input(s): "CKTOTAL", "CKMB", "CKMBINDEX", "TROPONINI" in the last 168 hours. BNP: Invalid input(s): "POCBNP" CBG: Recent Labs  Lab 08/08/22 0736 08/08/22 1128 08/08/22 1604 08/08/22 2058 08/09/22 0739  GLUCAP 83 117* 129* 148* 92   D-Dimer No results for input(s): "DDIMER" in the last 72 hours. Hgb A1c No results for input(s): "HGBA1C" in the last 72 hours. Lipid Profile No results for input(s): "CHOL", "HDL", "LDLCALC", "TRIG", "CHOLHDL",  "LDLDIRECT" in the last 72 hours. Thyroid function studies No results for input(s): "TSH", "T4TOTAL", "T3FREE", "THYROIDAB" in the last 72 hours.  Invalid input(s): "FREET3" Anemia work up No results for input(s): "VITAMINB12", "FOLATE", "FERRITIN", "TIBC", "IRON", "RETICCTPCT" in the last 72 hours. Urinalysis    Component Value Date/Time   COLORURINE YELLOW 08/05/2022 1717   APPEARANCEUR HAZY (A) 08/05/2022 1717   LABSPEC 1.013 08/05/2022 1717   PHURINE 6.0 08/05/2022 1717   GLUCOSEU NEGATIVE 08/05/2022 1717   HGBUR NEGATIVE 08/05/2022 1717   BILIRUBINUR NEGATIVE 08/05/2022 1717   KETONESUR NEGATIVE 08/05/2022 1717   PROTEINUR 100 (A) 08/05/2022 1717   NITRITE NEGATIVE 08/05/2022 1717   LEUKOCYTESUR NEGATIVE 08/05/2022 1717   Sepsis Labs Recent Labs  Lab 08/06/22 0521 08/07/22 0832 08/08/22 0509 08/09/22 0625  WBC 0.5* 2.8* 5.6 4.7   Microbiology Recent Results (from the past 240 hour(s))  Culture, blood (Routine X 2) w Reflex to ID Panel     Status: None (Preliminary result)   Collection Time: 08/05/22 12:49 PM   Specimen: BLOOD  Result Value Ref Range Status   Specimen Description   Final    BLOOD RIGHT ANTECUBITAL Performed at [facility], [place] [address], [place], [place] [address]    Special Requests   Final    BOTTLES DRAWN AEROBIC AND ANAEROBIC Blood Culture adequate volume Performed at [facility] [address] [place], [place] [address]    Culture   Final    NO GROWTH 4 DAYS Performed at [facility], [place] [address] [place], [place] [address]    Report Status PENDING  Incomplete  Culture, blood (Routine X 2) w Reflex to ID Panel     Status: None (Preliminary result)   Collection Time: 08/05/22  1:15 PM   Specimen: BLOOD  Result Value Ref Range Status   Specimen Description   Final    BLOOD LEFT ANTECUBITAL Performed at [facility] [address] [place], [place] [address]     Special Requests   Final    BOTTLES DRAWN AEROBIC AND ANAEROBIC Blood Culture adequate volume Performed at [facility] [address] [place], [place] [address]    Culture   Final    NO GROWTH 4 DAYS Performed at [facility], [place] [address], [place], [place] [address]    Report Status PENDING  Incomplete  Urine Culture     Status: Abnormal   Collection Time: 08/05/22  5:18 PM   Specimen: In/Out Cath Urine  Result Value Ref Range Status   Specimen Description   Final    IN/OUT CATH URINE Performed at [facility] [address], [place], [place] [address]    Special Requests   Final    NONE Performed at [facility], [place] [address], [place], [place] [address]    Culture >=100,000 COLONIES/mL ESCHERICHIA COLI (A)  Final   Report Status 08/07/2022 FINAL  Final   Organism ID, Bacteria ESCHERICHIA COLI (A)  Final      Susceptibility   Escherichia coli - MIC*    AMPICILLIN <=2 SENSITIVE Sensitive     CEFAZOLIN <=4 SENSITIVE Sensitive     CEFEPIME <=0.12 SENSITIVE Sensitive     CEFTRIAXONE <=0.25 SENSITIVE Sensitive     CIPROFLOXACIN <=0.25 SENSITIVE Sensitive     GENTAMICIN <=1 SENSITIVE Sensitive     IMIPENEM <=0.25 SENSITIVE Sensitive     NITROFURANTOIN <=16 SENSITIVE Sensitive     TRIMETH/SULFA <=20 SENSITIVE Sensitive     AMPICILLIN/SULBACTAM <=2 SENSITIVE Sensitive     PIP/TAZO <=4 SENSITIVE Sensitive     * >=100,000 COLONIES/mL ESCHERICHIA COLI     Time coordinating discharge: 25 minutes  SIGNED: Antonieta Pert, MD  [facility] 08/09/2022, 10:55 AM  If 7PM-7AM, please contact night-coverage [email or web address]

## 2022-08-10 LAB — CULTURE, BLOOD (ROUTINE X 2)
Culture: NO GROWTH
Culture: NO GROWTH
Special Requests: ADEQUATE
Special Requests: ADEQUATE

## 2022-08-11 ENCOUNTER — Encounter: Payer: Self-pay | Admitting: Hematology and Oncology

## 2022-08-11 ENCOUNTER — Telehealth: Payer: Self-pay

## 2022-08-11 NOTE — Telephone Encounter (Signed)
Returned her call. She was told to call the office when she was d/ced Saturday for lab appt. Scheduled lab appt at 1015 tomorrow, she is aware of the appt. She will sit outside the office after lab tomorrow to get results.  FYI

## 2022-08-12 ENCOUNTER — Inpatient Hospital Stay: Payer: PPO | Attending: Gynecologic Oncology

## 2022-08-12 ENCOUNTER — Other Ambulatory Visit: Payer: Self-pay

## 2022-08-12 DIAGNOSIS — E1122 Type 2 diabetes mellitus with diabetic chronic kidney disease: Secondary | ICD-10-CM | POA: Diagnosis not present

## 2022-08-12 DIAGNOSIS — C569 Malignant neoplasm of unspecified ovary: Secondary | ICD-10-CM

## 2022-08-12 DIAGNOSIS — D630 Anemia in neoplastic disease: Secondary | ICD-10-CM

## 2022-08-12 DIAGNOSIS — C786 Secondary malignant neoplasm of retroperitoneum and peritoneum: Secondary | ICD-10-CM | POA: Diagnosis not present

## 2022-08-12 DIAGNOSIS — Z79811 Long term (current) use of aromatase inhibitors: Secondary | ICD-10-CM | POA: Diagnosis not present

## 2022-08-12 DIAGNOSIS — N183 Chronic kidney disease, stage 3 unspecified: Secondary | ICD-10-CM | POA: Insufficient documentation

## 2022-08-12 DIAGNOSIS — E43 Unspecified severe protein-calorie malnutrition: Secondary | ICD-10-CM | POA: Diagnosis not present

## 2022-08-12 DIAGNOSIS — C561 Malignant neoplasm of right ovary: Secondary | ICD-10-CM | POA: Insufficient documentation

## 2022-08-12 DIAGNOSIS — Z853 Personal history of malignant neoplasm of breast: Secondary | ICD-10-CM

## 2022-08-12 LAB — CBC WITH DIFFERENTIAL (CANCER CENTER ONLY)
Abs Immature Granulocytes: 0.31 10*3/uL — ABNORMAL HIGH (ref 0.00–0.07)
Basophils Absolute: 0 10*3/uL (ref 0.0–0.1)
Basophils Relative: 0 %
Eosinophils Absolute: 0 10*3/uL (ref 0.0–0.5)
Eosinophils Relative: 0 %
HCT: 25.5 % — ABNORMAL LOW (ref 36.0–46.0)
Hemoglobin: 8.5 g/dL — ABNORMAL LOW (ref 12.0–15.0)
Immature Granulocytes: 4 %
Lymphocytes Relative: 12 %
Lymphs Abs: 0.8 10*3/uL (ref 0.7–4.0)
MCH: 32 pg (ref 26.0–34.0)
MCHC: 33.3 g/dL (ref 30.0–36.0)
MCV: 95.9 fL (ref 80.0–100.0)
Monocytes Absolute: 0.5 10*3/uL (ref 0.1–1.0)
Monocytes Relative: 7 %
Neutro Abs: 5.3 10*3/uL (ref 1.7–7.7)
Neutrophils Relative %: 77 %
Platelet Count: 81 10*3/uL — ABNORMAL LOW (ref 150–400)
RBC: 2.66 MIL/uL — ABNORMAL LOW (ref 3.87–5.11)
RDW: 17.5 % — ABNORMAL HIGH (ref 11.5–15.5)
WBC Count: 7 10*3/uL (ref 4.0–10.5)
nRBC: 0 % (ref 0.0–0.2)

## 2022-08-12 LAB — SAMPLE TO BLOOD BANK

## 2022-08-12 NOTE — Progress Notes (Signed)
Given a copy of CBC results in the lobby to her and her husband. She is feeling good and going on a trip to the beach. She will call the office back for questions/concerns.

## 2022-08-14 ENCOUNTER — Telehealth: Payer: Self-pay

## 2022-08-14 NOTE — Telephone Encounter (Signed)
Called patient on 08/14/2022 , 2:22 PM in an attempt to reach the patient for a hospital follow up.   Admit date: 08/05/22 Discharge: 08/09/22   She does not have any questions or concerns about medications from the hospital admission. The patient's medications were reviewed over the phone, they were counseled to bring in all current medications to the hospital follow up visit.   I advised the patient to call if any questions or concerns arise about the hospital admission or medications    Home health was not started in the hospital.  All questions were answered and a follow up appointment was made.   Prior to Admission medications   Medication Sig Start Date End Date Taking? Authorizing Provider  acetaminophen (TYLENOL) 325 MG tablet Take 325 mg by mouth every 6 (six) hours as needed for headache.    [provider]  allopurinol (ZYLOPRIM) 100 MG tablet Take 1 tablet (100 mg total) by mouth daily. FOR GOUT PREVENTION, Patient taking differently: Take 100 mg by mouth daily. 03/25/22   Liane Comber, NP  amoxicillin (AMOXIL) 500 MG capsule Take 1 capsule (500 mg total) by mouth 2 (two) times daily for 7 days. 08/09/22 08/16/22  Antonieta Pert, MD  amoxicillin (AMOXIL) 500 MG tablet Take 2,000 mg by mouth See admin instructions. Take prior to dental cleanings 03/03/22   [provider]  Cholecalciferol (VITAMIN D3) 5000 units CAPS Take 5,000 Units by mouth daily.    [provider]  cyanocobalamin 1000 MCG tablet Take 1,000 mcg by mouth daily.    [provider]  dexamethasone (DECADRON) 4 MG tablet Take 2 tablets the morning before chemotherapy and then 2 tablets daily for 2 days after chemo 07/01/22   Heath Lark, MD  ezetimibe (ZETIA) 10 MG tablet TAKE 1 TABLET BY MOUTH DAILY FOR CHOLESTEROL Patient taking differently: Take 10 mg by mouth daily. 04/24/22   Liane Comber, NP  gabapentin (NEURONTIN) 300 MG capsule Take 1 capsule 4 x  /day for Pain Patient taking  differently: Take 300 mg by mouth 4 (four) times daily. 12/07/21   [name], MD  glimepiride (AMARYL) 1 MG tablet Take 1 tab by mouth in the morning if fasting is running 150+. Please do not take this medication if you are ill or not eating as this can cause a low blood sugar. Patient taking differently: Take 1 mg by mouth See admin instructions. Take 1 tab by mouth daily as needed if fasting is running 150+. Please do not take this medication if you are ill or not eating as this can cause a low blood sugar. 05/04/20   Liane Comber, NP  lidocaine-prilocaine (EMLA) cream Apply 1 application. topically daily as needed (for port access). 03/07/22   Heath Lark, MD  magnesium oxide (MAG-OX) 400 (240 Mg) MG tablet Take 1 tablet by mouth daily. 07/19/22   [provider]  metoprolol succinate (TOPROL-XL) 25 MG 24 hr tablet Take  1 tablet  Daily  for BP       /TAKE 1 TABLET BY MOUTH DAILY FOR BLOOD PRESSURE Patient taking differently: Take 25 mg by mouth daily. 08/25/21   [name], MD  ondansetron (ZOFRAN) 8 MG tablet Take 1 tablet (8 mg total) by mouth every 8 (eight) hours as needed for refractory nausea / vomiting. 06/13/22   Heath Lark, MD  polyethylene glycol (MIRALAX) 17 g packet Take 17 g by mouth 2 (two) times daily. 17 grams in 6 oz of favorite drink twice a  day until bowel movement.  LAXITIVE.  Restart if two days since last bowel movement Patient taking differently: Take 17 g by mouth 2 (two) times daily. 11/10/21   Shepperson, Kirstin, PA-C  rosuvastatin (CRESTOR) 5 MG tablet TAKE 1 TABLET BY MOUTH IN THE EVENING THREE TIMES WEEKLY FOR CHOLESTEROL Patient taking differently: Take 5 mg by mouth See admin instructions. Monday, Wednesday and Friday 03/07/22   Liane Comber, NP  trolamine salicylate (ASPERCREME) 10 % cream Apply 1 application topically as needed for muscle pain (Use if you still feel tingles in feet in the morning after taking Gabapentin).    [provider]   vitamin C (ASCORBIC ACID) 500 MG tablet Take 500 mg by mouth every evening.     [provider]

## 2022-08-18 MED FILL — Dexamethasone Sodium Phosphate Inj 100 MG/10ML: INTRAMUSCULAR | Qty: 1 | Status: AC

## 2022-08-19 ENCOUNTER — Inpatient Hospital Stay: Payer: PPO

## 2022-08-19 ENCOUNTER — Encounter: Payer: Self-pay | Admitting: Hematology and Oncology

## 2022-08-19 ENCOUNTER — Other Ambulatory Visit: Payer: Self-pay

## 2022-08-19 ENCOUNTER — Inpatient Hospital Stay (HOSPITAL_BASED_OUTPATIENT_CLINIC_OR_DEPARTMENT_OTHER): Payer: PPO | Admitting: Hematology and Oncology

## 2022-08-19 VITALS — BP 134/61 | HR 94 | Temp 97.4°F | Resp 18 | Ht 64.0 in | Wt 151.0 lb

## 2022-08-19 DIAGNOSIS — Z95828 Presence of other vascular implants and grafts: Secondary | ICD-10-CM

## 2022-08-19 DIAGNOSIS — E1122 Type 2 diabetes mellitus with diabetic chronic kidney disease: Secondary | ICD-10-CM

## 2022-08-19 DIAGNOSIS — Z853 Personal history of malignant neoplasm of breast: Secondary | ICD-10-CM

## 2022-08-19 DIAGNOSIS — C569 Malignant neoplasm of unspecified ovary: Secondary | ICD-10-CM | POA: Diagnosis not present

## 2022-08-19 DIAGNOSIS — D630 Anemia in neoplastic disease: Secondary | ICD-10-CM

## 2022-08-19 DIAGNOSIS — E440 Moderate protein-calorie malnutrition: Secondary | ICD-10-CM | POA: Diagnosis not present

## 2022-08-19 DIAGNOSIS — N183 Chronic kidney disease, stage 3 unspecified: Secondary | ICD-10-CM

## 2022-08-19 DIAGNOSIS — C561 Malignant neoplasm of right ovary: Secondary | ICD-10-CM | POA: Diagnosis not present

## 2022-08-19 LAB — CBC WITH DIFFERENTIAL (CANCER CENTER ONLY)
Abs Immature Granulocytes: 0.04 10*3/uL (ref 0.00–0.07)
Basophils Absolute: 0 10*3/uL (ref 0.0–0.1)
Basophils Relative: 0 %
Eosinophils Absolute: 0 10*3/uL (ref 0.0–0.5)
Eosinophils Relative: 0 %
HCT: 25 % — ABNORMAL LOW (ref 36.0–46.0)
Hemoglobin: 8.3 g/dL — ABNORMAL LOW (ref 12.0–15.0)
Immature Granulocytes: 1 %
Lymphocytes Relative: 13 %
Lymphs Abs: 1 10*3/uL (ref 0.7–4.0)
MCH: 31.3 pg (ref 26.0–34.0)
MCHC: 33.2 g/dL (ref 30.0–36.0)
MCV: 94.3 fL (ref 80.0–100.0)
Monocytes Absolute: 0.4 10*3/uL (ref 0.1–1.0)
Monocytes Relative: 5 %
Neutro Abs: 6.3 10*3/uL (ref 1.7–7.7)
Neutrophils Relative %: 81 %
Platelet Count: 152 10*3/uL (ref 150–400)
RBC: 2.65 MIL/uL — ABNORMAL LOW (ref 3.87–5.11)
RDW: 17.5 % — ABNORMAL HIGH (ref 11.5–15.5)
WBC Count: 7.7 10*3/uL (ref 4.0–10.5)
nRBC: 0 % (ref 0.0–0.2)

## 2022-08-19 LAB — CMP (CANCER CENTER ONLY)
ALT: 23 U/L (ref 0–44)
AST: 42 U/L — ABNORMAL HIGH (ref 15–41)
Albumin: 2.6 g/dL — ABNORMAL LOW (ref 3.5–5.0)
Alkaline Phosphatase: 132 U/L — ABNORMAL HIGH (ref 38–126)
Anion gap: 7 (ref 5–15)
BUN: 25 mg/dL — ABNORMAL HIGH (ref 8–23)
CO2: 22 mmol/L (ref 22–32)
Calcium: 8.9 mg/dL (ref 8.9–10.3)
Chloride: 109 mmol/L (ref 98–111)
Creatinine: 1.23 mg/dL — ABNORMAL HIGH (ref 0.44–1.00)
GFR, Estimated: 43 mL/min — ABNORMAL LOW (ref >60)
Glucose, Bld: 188 mg/dL — ABNORMAL HIGH (ref 70–99)
Potassium: 3.9 mmol/L (ref 3.5–5.1)
Sodium: 138 mmol/L (ref 135–145)
Total Bilirubin: 0.7 mg/dL (ref 0.3–1.2)
Total Protein: 6.3 g/dL — ABNORMAL LOW (ref 6.5–8.1)

## 2022-08-19 MED ORDER — MAGNESIUM OXIDE -MG SUPPLEMENT 400 (240 MG) MG PO TABS: 1.0000 | ORAL_TABLET | Freq: Every day | ORAL | 1 refills | Status: DC

## 2022-08-19 MED ORDER — SODIUM CHLORIDE 0.9% FLUSH
10.0000 mL | Freq: Once | INTRAVENOUS | Status: AC
  Administered 2022-08-19: 10 mL

## 2022-08-19 NOTE — Progress Notes (Signed)
OFF PATHWAY REGIMEN - Ovarian  No Change  Continue With Treatment as Ordered.  Original Decision Date/Time: 07/01/2022 13:10   [id number]:Docetaxel 100 mg/m2 IV D1 q21 Days:   A cycle is every 21 days:     Docetaxel   **Always confirm dose/schedule in your pharmacy ordering system**  Patient Characteristics: Recurrent or Progressive Disease, Fourth Line and Beyond, Low, Medium, or Unknown Folate Receptor Alpha Expression OR  Prior Mirvetuximab OR Not a Candidate for Mirvetuximab BRCA Mutation Status: Present ([place]) Therapeutic Status: Recurrent or Progressive Disease Line of Therapy: Fourth Line and Beyond Folate Receptor Alpha (FR?) Expression: Unknown Mirvetuximab Candidacy: Not a Candidate for Mirvetuximab OR Prior Mirvetuximab Intent of Therapy: Non-Curative / Palliative Intent, Discussed with Patient

## 2022-08-19 NOTE — Progress Notes (Unsigned)
Hospital follow up  Assessment and Plan: Hospital visit follow up for:      All medications were reviewed with patient and family and fully reconciled. All questions answered fully, and patient and family members were encouraged to call the office with any further questions or concerns. Discussed goal to avoid readmission related to this diagnosis.  There are no discontinued medications.  CAN NOT DO FOR BCBS REGULAR OR MEDICARE***  Over 40 minutes of exam, counseling, chart review, and complex, high/moderate level critical decision making was performed this visit.   Future Appointments  Date Time Provider [place]  08/19/2022 10:30 AM [facility] None  08/20/2022 11:30 AM Alycia Rossetti, NP [facility] None  09/15/2022  9:30 AM [name], MD [facility] None  02/25/2023 10:00 AM Darrol Jump, NP [facility] None  05/29/2023 11:00 AM Darrol Jump, NP [facility] None     HPI 86 y.o.female presents for follow up for transition from recent hospitalization or SNIF stay. Admit date to the hospital was 08/05/22, patient was discharged from the hospital on 08/09/22 and our clinical staff contacted the office the day after discharge to set up a follow up appointment. The discharge summary, medications, and diagnostic test results were reviewed before meeting with the patient. The patient was admitted for:     Brief/Interim Summary: 86 y.o. female with medical history significant of ovarian cancer, HTN, CKD3b, DM2. Presenting with abdominal pain and weakness.had chemo session on 07/29/22. In ED she's neutropenic, [name] was consulted and admitted.  Onco advised no prophylatic abx until she spikes a fever, per history she had diarrhea and abdominal pain. In ED- CXR neg, further got ct abd pelvis w/o- "consistent with diffuse interstitial pancreatitis,slight interval progression of peritoneal carcinomatosis". Patient spiked temp upto 102.6, wbc at 0.5. UA w/ cb  11-20, urine cx and blood cx ordered.  Patient was conservatively treated with IV fluid hydration, IV antibiotics-suspected to have UTI had febrile episode with leukopenia.  At this time WBC count has normalized platelet count improving, creatinine holding at 1.4-1.6 range also had hypokalemia replaced 08/08/22.  At this time tolerating regular diet  Urine culture with E. coli which is pansensitive.  Potassium has normalized creatinine improved with hydration overnight.  At this time she is tolerating diet, feels ready for home today.  She will follow-up with her oncology as scheduled next week.    Discharge Diagnoses:  Principal Problem:   Acute pancreatitis Active Problems:   Essential hypertension   Hyperlipidemia associated with type 2 diabetes mellitus (HCC)   DM2 (diabetes mellitus, type 2) (HCC)   Ovarian cancer (HCC)   Pancytopenia, acquired ([place])   Hypomagnesemia   Abdominal pain   Stage 3b chronic kidney disease (CKD) (HCC)   Neutropenia (HCC)   AKI (acute kidney injury) ([place])   Generalized weakness   Neutropenic fever E. coli UTI: Clinically improved continue amoxicillin to complete the course.blood culture NGTD   Acute pancreatitis: Unclear etiology she is on HCTZ will be discontinued as potential etiology.  CT w/ dissuse interstitial pancreatitis, conservatively managed with IV fluids antiemetics pain medication diet as tolerated-tolerating regular diet-advised outpatient follow-up   AKI on CKD stage IIIb: Baseline creatinine around 1.1 in July.  Creatinine was uptrending, peaked 1.7 now downtrending 1.4 and likely her new baseline.  Encourage oral hydration. Last Labs           Recent Labs  Lab 08/05/22 1019 08/06/22 0521 08/07/22 0534 08/08/22 0509 08/08/22 1419 08/09/22 0625  BUN 38* 33*  --  31* 35* 28*  CREATININE 1.74* 1.45* 1.44* 1.62* 1.75* 1.45*        Acquired pancytopenia: Due to recent treatment, has chronic pancytopenia at baseline due to chronic bone  marrow suppression from treatment.  WBC count and platelet counts are improving-stable, hemoglobin 8 to 9 g.  Monitor  Last Labs          Recent Labs  Lab 08/06/22 0521 08/07/22 0832 08/08/22 0509 08/09/22 0625 08/09/22 1002  HGB 8.6* 9.3* 8.7* 7.3* 8.1*  HCT 26.1* 28.4* 26.0* 22.4* 25.4*        Type 2 diabetes mellitus blood sugar well controlled continue sliding scale insulin.  Holding p.o. meds   Hypokalemia: Repleted and is stable  Hypomagnesemia replaced Recurrent ovarian cancer followed by Dr. [name] Hypertension: BP stable.  Resume home meds on discharge but discontinue HCTZ Hyperlipidemia holding meds for now      Home health {ACTION; IS/IS LKG:[id number]} involved.   Images while in the hospital: CT ABDOMEN PELVIS WO CONTRAST  Result Date: 08/05/2022 CLINICAL DATA:  Left-sided abdominal pain. History of ovarian cancer. * Tracking Code: BO * EXAM: CT ABDOMEN AND PELVIS WITHOUT CONTRAST TECHNIQUE: Multidetector CT imaging of the abdomen and pelvis was performed following the standard protocol without IV contrast. RADIATION DOSE REDUCTION: This exam was performed according to the departmental dose-optimization program which includes automated exposure control, adjustment of the mA and/or kV according to patient size and/or use of iterative reconstruction technique. COMPARISON:  06/11/2022 FINDINGS: Lower chest: The lung bases are clear of an acute process. Minimal streaky bibasilar atelectasis. The heart is normal in size. No pericardial effusion. Hepatobiliary: No hepatic lesions are identified without contrast. No intrahepatic biliary dilatation. Small layering gallstones suspected in the gallbladder. No findings for acute cholecystitis. No common bile duct dilatation. Pancreas: Diffuse inflammation/edema in and around the pancreas consistent with diffuse interstitial pancreatitis. Spleen: Stable mild splenomegaly. Adrenals/Urinary Tract: Adrenal glands and kidneys are. Bladder  is grossly normal. Stomach/Bowel: The stomach, duodenum, small bowel and colon are grossly no. Vascular/Lymphatic: Moderate atherosclerotic calcification involving the aorta but no aneurysm. Stable scattered mesenteric and retroperitoneal lymph nodes and progressive peritoneal carcinomatosis. Numerous omental and peritoneal implants slightly progressive when compared to the prior study. The soft tissue lesion in the right lower quadrant adjacent to the ascending colon measures 2.6 x 2.1 cm and previously measured 2.5 x 2.0 cm. Multiple pelvic implants are grossly stable to slightly enlarged. Reproductive: Surgically absent. Other: Small amount of free pelvic fluid noted. Musculoskeletal: No significant bony findings. IMPRESSION: 1. 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 2. Slight interval progression of peritoneal carcinomatosis. Aortic Atherosclerosis (ICD10-I70.0). Electronically Signed   By: Marijo Sanes M.D.   On: 08/05/2022 14:20   DG Chest Port 1 View  Result Date: 08/05/2022 CLINICAL DATA:  Weakness with decreased p.o. intake. EXAM: PORTABLE CHEST 1 VIEW COMPARISON:  November 07, 2021 FINDINGS: EKG leads project over the chest. RIGHT-sided Port-A-Cath terminating at the caval to atrial junction. Mild rotation to the RIGHT. Accounting for this cardiomediastinal contours and hilar structures are normal. Lungs are clear.  No sign of effusion.  No pneumothorax. Post LEFT breast lumpectomy. On limited assessment no acute skeletal findings. IMPRESSION: 1. No acute cardiopulmonary disease. Electronically Signed   By: Zetta Bills M.D.   On: 08/05/2022 09:59     Current Outpatient Medications (Endocrine & Metabolic):    dexamethasone (DECADRON) 4 MG tablet, Take 2 tablets the morning before chemotherapy and then 2 tablets daily for 2  days after chemo   glimepiride (AMARYL) 1 MG tablet, Take 1 tab by mouth in the morning if fasting is running 150+. Please do not take this medication  if you are ill or not eating as this can cause a low blood sugar. (Patient taking differently: Take 1 mg by mouth See admin instructions. Take 1 tab by mouth daily as needed if fasting is running 150+. Please do not take this medication if you are ill or not eating as this can cause a low blood sugar.)  Current Outpatient Medications (Cardiovascular):    ezetimibe (ZETIA) 10 MG tablet, TAKE 1 TABLET BY MOUTH DAILY FOR CHOLESTEROL (Patient taking differently: Take 10 mg by mouth daily.)   metoprolol succinate (TOPROL-XL) 25 MG 24 hr tablet, Take  1 tablet  Daily  for BP       /TAKE 1 TABLET BY MOUTH DAILY FOR BLOOD PRESSURE (Patient taking differently: Take 25 mg by mouth daily.)   rosuvastatin (CRESTOR) 5 MG tablet, TAKE 1 TABLET BY MOUTH IN THE EVENING THREE TIMES WEEKLY FOR CHOLESTEROL (Patient taking differently: Take 5 mg by mouth See admin instructions. Monday, Wednesday and Friday)   Current Outpatient Medications (Analgesics):    acetaminophen (TYLENOL) 325 MG tablet, Take 325 mg by mouth every 6 (six) hours as needed for headache.   allopurinol (ZYLOPRIM) 100 MG tablet, Take 1 tablet (100 mg total) by mouth daily. FOR GOUT PREVENTION, (Patient taking differently: Take 100 mg by mouth daily.)  Current Outpatient Medications (Hematological):    cyanocobalamin 1000 MCG tablet, Take 1,000 mcg by mouth daily.  Current Outpatient Medications (Other):    amoxicillin (AMOXIL) 500 MG tablet, Take 2,000 mg by mouth See admin instructions. Take prior to dental cleanings   Cholecalciferol (VITAMIN D3) 5000 units CAPS, Take 5,000 Units by mouth daily.   gabapentin (NEURONTIN) 300 MG capsule, Take 1 capsule 4 x  /day for Pain (Patient taking differently: Take 300 mg by mouth 4 (four) times daily.)   lidocaine-prilocaine (EMLA) cream, Apply 1 application. topically daily as needed (for port access).   magnesium oxide (MAG-OX) 400 (240 Mg) MG tablet, Take 1 tablet by mouth daily.   ondansetron (ZOFRAN)  8 MG tablet, Take 1 tablet (8 mg total) by mouth every 8 (eight) hours as needed for refractory nausea / vomiting.   polyethylene glycol (MIRALAX) 17 g packet, Take 17 g by mouth 2 (two) times daily. 17 grams in 6 oz of favorite drink twice a day until bowel movement.  LAXITIVE.  Restart if two days since last bowel movement (Patient taking differently: Take 17 g by mouth 2 (two) times daily.)   trolamine salicylate (ASPERCREME) 10 % cream, Apply 1 application topically as needed for muscle pain (Use if you still feel tingles in feet in the morning after taking Gabapentin).   vitamin C (ASCORBIC ACID) 500 MG tablet, Take 500 mg by mouth every evening.   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[place]) 02/29/2016   skin- 2016- squamous- on right upper arm   Chronic kidney disease    Closed displaced fracture of right femoral neck (HCC) 11/07/2021   Deficiency anemia 11/02/2020   Diabetes mellitus without complication (HCC)    GERD (gastroesophageal reflux disease)    Gout    takes Allopurinol daily   History of chemotherapy  History of colon polyps    benign   History of shingles    Hx of radiation therapy    Hyperlipidemia    Hypertension    Ovarian ca ([place]) dx'd 03/2020   Personal history of chemotherapy    2017   Personal history of radiation therapy    2017   Vitamin D deficiency      Allergies  Allergen Reactions   Keflex [Cephalexin] Swelling    Tongue and throat swells   Mobic [Meloxicam] Swelling   Prednisone Swelling and Other (See Comments)    Can Not take High doses   Premarin [Estrogens Conjugated] Hives   Trovan [Alatrofloxacin] Other (See Comments)    Unknown reaction    ROS: all negative except above.   Physical Exam: There were no vitals filed for this visit. There were no vitals taken for this visit. General  Appearance: Well nourished, in no apparent distress. Eyes: PERRLA, EOMs, conjunctiva no swelling or erythema Sinuses: No Frontal/maxillary tenderness ENT/Mouth: Ext aud canals clear, TMs without erythema, bulging. No erythema, swelling, or exudate on post pharynx.  Tonsils not swollen or erythematous. Hearing normal.  Neck: Supple, thyroid normal.  Respiratory: Respiratory effort normal, BS equal bilaterally without rales, rhonchi, wheezing or stridor.  Cardio: RRR with no MRGs. Brisk peripheral pulses without edema.  Abdomen: Soft, + BS.  Non tender, no guarding, rebound, hernias, masses. Lymphatics: Non tender without lymphadenopathy.  Musculoskeletal: Full ROM, 5/5 strength, normal gait.  Skin: Warm, dry without rashes, lesions, ecchymosis.  Neuro: Cranial nerves intact. Normal muscle tone, no cerebellar symptoms. Sensation intact.  Psych: Awake and oriented X 3, normal affect, Insight and Judgment appropriate.     Alycia Rossetti, NP 9:54 AM [facility]

## 2022-08-19 NOTE — Progress Notes (Signed)
[place] OFFICE PROGRESS NOTE  Patient Care Team: [name], MD as PCP - Kinnie Scales, OD as Referring Physician (Optometry) [name], MD as Consulting Physician (Dermatology) Latanya Maudlin, MD as Consulting Physician (Orthopedic Surgery) Inda Castle, MD (Inactive) as Consulting Physician (Gastroenterology) Heath Lark, MD as Consulting Physician (Hematology and Oncology) Madelon Lips, MD as Consulting Physician (Nephrology) Carleene Mains, [facility] (Pharmacist)  ASSESSMENT & PLAN:  Ovarian cancer [facility]) She is weak and debilitated She has gained a lot of fluids due to fluid retention secondary to third spacing Her blood counts are barely recovered I do not recommend we proceed with treatment today I plan to delay her treatment to next week and to consider adding G-CSF support due to recent infection and hospitalization She might need further dose reduction in the future depending on her recovery  Anemia in neoplastic disease Her blood count improves but she was very anemic and required blood transfusion while hospitalized Observe closely  CKD stage 3 due to type 2 diabetes mellitus ([place]) She had acute renal failure that improved with discontinuation of hydrochlorothiazide Due to significant fluid retention, recommend resumption of diuretic  Protein-calorie malnutrition, moderate ([place]) She has severe protein calorie malnutrition She has significant third spacing I recommend resumption of diuretic and high-protein diet  Orders Placed This Encounter  Procedures   CBC with Differential ([place] Only)    Standing Status:   Future    Standing Expiration Date:   08/30/2023   CMP ([place] only)    Standing Status:   Future    Standing Expiration Date:   08/30/2023   CBC with Differential ([place] Only)    Standing Status:   Future    Standing Expiration Date:   09/20/2023   CMP ([place] only)    Standing Status:    Future    Standing Expiration Date:   09/20/2023    All questions were answered. The patient knows to call the clinic with any problems, questions or concerns. The total time spent in the appointment was 40 minutes encounter with patients including review of chart and various tests results, discussions about plan of care and coordination of care plan   Heath Lark, MD 08/19/2022 12:26 PM  INTERVAL HISTORY: Please see below for problem oriented charting. she returns for treatment follow-up to resume chemotherapy after recent hospitalization She has gained a lot of fluid in her abdomen and lower extremity Her appetite is fair She has altered taste sensation She denies recent bleeding She is overall weak requiring using a cane for assistance when she walks from 1 room to another  REVIEW OF SYSTEMS:   Constitutional: Denies fevers, chills or abnormal weight loss Eyes: Denies blurriness of vision Ears, nose, mouth, throat, and face: Denies mucositis or sore throat Respiratory: Denies cough, dyspnea or wheezes Cardiovascular: Denies palpitation, chest discomfort  Gastrointestinal:  Denies nausea, heartburn or change in bowel habits Skin: Denies abnormal skin rashes Lymphatics: Denies new lymphadenopathy or easy bruising Behavioral/Psych: Mood is stable, no new changes  All other systems were reviewed with the patient and are negative.  I have reviewed the past medical history, past surgical history, social history and family history with the patient and they are unchanged from previous note.  ALLERGIES:  is allergic to keflex [cephalexin], mobic [meloxicam], prednisone, premarin [estrogens conjugated], and trovan [alatrofloxacin].  MEDICATIONS:  Current Outpatient Medications  Medication Sig Dispense Refill   acetaminophen (TYLENOL) 325 MG tablet Take 325 mg by  mouth every 6 (six) hours as needed for headache.     allopurinol (ZYLOPRIM) 100 MG tablet Take 1 tablet (100 mg total) by  mouth daily. FOR GOUT PREVENTION, (Patient taking differently: Take 100 mg by mouth daily.) 90 tablet 3   amoxicillin (AMOXIL) 500 MG tablet Take 2,000 mg by mouth See admin instructions. Take prior to dental cleanings     Cholecalciferol (VITAMIN D3) 5000 units CAPS Take 5,000 Units by mouth daily.     cyanocobalamin 1000 MCG tablet Take 1,000 mcg by mouth daily.     dexamethasone (DECADRON) 4 MG tablet Take 2 tablets the morning before chemotherapy and then 2 tablets daily for 2 days after chemo 30 tablet 1   ezetimibe (ZETIA) 10 MG tablet TAKE 1 TABLET BY MOUTH DAILY FOR CHOLESTEROL (Patient taking differently: Take 10 mg by mouth daily.) 90 tablet 3   gabapentin (NEURONTIN) 300 MG capsule Take 1 capsule 4 x  /day for Pain (Patient taking differently: Take 300 mg by mouth 4 (four) times daily.) 360 capsule 3   glimepiride (AMARYL) 1 MG tablet Take 1 tab by mouth in the morning if fasting is running 150+. Please do not take this medication if you are ill or not eating as this can cause a low blood sugar. (Patient taking differently: Take 1 mg by mouth See admin instructions. Take 1 tab by mouth daily as needed if fasting is running 150+. Please do not take this medication if you are ill or not eating as this can cause a low blood sugar.) 90 tablet 1   lidocaine-prilocaine (EMLA) cream Apply 1 application. topically daily as needed (for port access). 30 g 3   magnesium oxide (MAG-OX) 400 (240 Mg) MG tablet Take 1 tablet (400 mg total) by mouth daily. 30 tablet 1   metoprolol succinate (TOPROL-XL) 25 MG 24 hr tablet Take  1 tablet  Daily  for BP       /TAKE 1 TABLET BY MOUTH DAILY FOR BLOOD PRESSURE (Patient taking differently: Take 25 mg by mouth daily.) 90 tablet 3   ondansetron (ZOFRAN) 8 MG tablet Take 1 tablet (8 mg total) by mouth every 8 (eight) hours as needed for refractory nausea / vomiting. 30 tablet 1   polyethylene glycol (MIRALAX) 17 g packet Take 17 g by mouth 2 (two) times daily. 17 grams  in 6 oz of favorite drink twice a day until bowel movement.  LAXITIVE.  Restart if two days since last bowel movement (Patient taking differently: Take 17 g by mouth 2 (two) times daily.) 14 packet 0   rosuvastatin (CRESTOR) 5 MG tablet TAKE 1 TABLET BY MOUTH IN THE EVENING THREE TIMES WEEKLY FOR CHOLESTEROL (Patient taking differently: Take 5 mg by mouth See admin instructions. Monday, Wednesday and Friday) 36 tablet 3   trolamine salicylate (ASPERCREME) 10 % cream Apply 1 application topically as needed for muscle pain (Use if you still feel tingles in feet in the morning after taking Gabapentin).     vitamin C (ASCORBIC ACID) 500 MG tablet Take 500 mg by mouth every evening.      No current facility-administered medications for this visit.    SUMMARY OF ONCOLOGIC HISTORY: Oncology History Overview Note  HRD positive Progressed and intolerant to olaparib Progressed on carboplatin, taxol and gemzar   History of left breast cancer  02/26/2016 Initial Diagnosis   Left breast biopsy 11:00 position: invasive ductal carcinoma with DCIS, ER 90%, PR 10%, HER-2 negative, Ki-67 30%, grade 2, 2.2  cm palpable lesion T2 N0 stage II a clinical stage   03/18/2016 Surgery   Left lumpectomy: Invasive ductal carcinoma, grade 2, 6.3 cm, with high-grade DCIS, margins negative, 0/4 lymph nodes negative, ER 90%, via 10%, HER-2 negative ratio 0.97, Ki-67 30%, T3 N0 stage IIB   03/25/2016 Procedure   Genetic testing is negative for pathogenic mutations within any of the 20 Genes on the breast/ovarian cancer panel   04/04/2016 Oncotype testing   Oncotype DX recurrence score 37, 25% 10 year distant risk of recurrence   04/17/2016 - 07/31/2016 Chemotherapy   Adjuvant chemotherapy with dose dense Adriamycin and Cytoxan followed by Abraxane weekly 8 ( discontinued due to neuropathy)   09/01/2016 - 09/26/2016 Radiation Therapy   Adj XRT 1) Left breast: 42.5 Gy in 17 fractions. 2) Left breast boost: 7.5 Gy in 3  fractions.   12/02/2016 -  Anti-estrogen oral therapy   Anastrozole 1 mg daily   09/05/2021 - 05/21/2022 Chemotherapy   Patient is on Treatment Plan : OVARIAN RECURRENT 3RD LINE Carboplatin D1 / Gemcitabine D1,8 (4/800) q21d     07/08/2022 - 07/29/2022 Chemotherapy   Patient is on Treatment Plan : ovarian Docetaxel q21d     07/08/2022 -  Chemotherapy   Patient is on Treatment Plan : BREAST Docetaxel (100) q21d     Ovarian cancer ([place])  04/03/2020 Imaging   US pelvis Complex cystic and solid mass in LEFT adnexa 12.5 cm diameter question cystic ovarian neoplasm; recommend correlation with serum tumor markers and further evaluation by MR imaging with and without contrast.     04/04/2020 Imaging   US venous Doppler No evidence of deep venous thrombosis in the right lower extremity. Left common femoral vein also patent.   04/15/2020 Imaging   MRI pelvis 1. Large complex solid and cystic mass arising from the right adnexa measuring 10.8 by 11.5 by 11.1 cm. This has an aggressive appearance with extensive enhancing mural soft tissue components. Findings are highly suspicious for malignant ovarian neoplasm. 2. Extensive bilateral retroperitoneal and bilateral iliac adenopathy compatible with metastatic disease. 3. Signs of extensive peritoneal carcinomatosis including ascites, enhancement, thickening and nodularity of the peritoneal reflections, omental caking and bulky peritoneal nodularity. 4. Suspected serosal involvement of the dome of bladder with loss of normal fat plane. Cannot rule out mural invasion by tumor.   04/17/2020 Tumor Marker   Patient's tumor was tested for the following markers: CA-125 Results of the tumor marker test revealed 1762.   04/20/2020 Cancer Staging   Staging form: Ovary, Fallopian Tube, and Primary Peritoneal Carcinoma, AJCC 8th Edition - Clinical: FIGO Stage IIIC (cT3, cN1, cM0) - Signed by Heath Lark, MD on 04/20/2020   04/23/2020 Imaging   1. Redemonstrated dominant  mixed solid and cystic mass arising from the vicinity of the right ovary measuring at least 11.5 x 10.7 cm, not significantly changed compared to prior MR, and consistent with primary ovarian malignancy.  2. Numerous bulky retroperitoneal, bilateral iliac, and pelvic sidewall lymph nodes. 3. Moderate volume ascites throughout the abdomen and pelvis with subtle thickening and nodularity throughout the peritoneum, and extensive bulky nodular metastatic disease of the omentum. 4. Constellation of findings is consistent with advanced nodal and peritoneal metastatic disease. 5. There are prominent subcentimeter epicardial lymph nodes, nonspecific although suspicious for nodal metastatic disease. No definite nodal metastatic disease in the chest. Attention on follow-up. 6. There are multiple small subpleural nodules at the right lung base overlying the diaphragm, measuring up to 7 mm. These  are generally nonspecific and less favored to represent pulmonary metastatic disease given distribution. Attention on follow-up. 7. Somewhat coarse contour of the liver, suggestive of cirrhosis, although out without overt morphologic stigmata. 8. Aortic Atherosclerosis (ICD10-I70.0).     04/24/2020 Procedure   Successful placement of a right internal jugular approach power injectable Port-A-Cath. The catheter is ready for immediate use.     05/02/2020 Tumor Marker   Patient's tumor was tested for the following markers: CA-125 Results of the tumor marker test revealed 1921   05/03/2020 - 12/12/2020 Chemotherapy   The patient had carboplatin and taxol for chemotherapy treatment.     05/08/2020 Procedure   Successful ultrasound-guided paracentesis yielding 2.6 liters of peritoneal fluid.   05/16/2020 Procedure   Successful ultrasound-guided paracentesis yielding 3.2 liters of peritoneal fluid   05/25/2020 Procedure   Successful ultrasound-guided paracentesis yielding 3 liters of peritoneal fluid.     06/01/2020  Procedure   Successful ultrasound-guided therapeutic paracentesis yielding 1.7 liters of peritoneal fluid.   06/14/2020 Tumor Marker   Patient's tumor was tested for the following markers: CA-125 Results of the tumor marker test revealed 1309   06/20/2020 Imaging   1. Dominant mixed cystic and solid mass in the central pelvis is stable in the interval. 2. Clear interval decrease in retroperitoneal and pelvic sidewall lymphadenopathy. The bulky omental disease has also clearly decreased in the interval. 3. Interval decrease in ascites. 4. Stable 8 mm subpleural nodule along the diaphragm. 5. Aortic Atherosclerosis (ICD10-I70.0).   07/10/2020 Tumor Marker   Patient's tumor was tested for the following markers: CA-125 Results of the tumor marker test revealed 220   08/03/2020 Tumor Marker   Patient's tumor was tested for the following markers: CA-125 Results of the tumor marker test revealed 53.3.   09/03/2020 Tumor Marker   Patient's tumor was tested for the following markers: CA-125 Results of the tumor marker test revealed 29.7   09/20/2020 Imaging   1. Substantial improvement. The right ovarian cystic and solid mass is half the volume that it measured on 06/20/2020. Similar reduction in the bulk of the omental caking of tumor. Prior ascites is resolved and the retroperitoneal adenopathy is markedly improved. 2. Other imaging findings of potential clinical significance: Stable pleural-based nodularity along the right hemidiaphragm measuring about 1.1 by 0.7 by 0.3 cm. Stable hypodense lesion of the right kidney upper pole, probably a cyst although the configuration of this lesion makes it difficult to obtain accurate density measurements. Left ovarian cyst, stable. Chondrocalcinosis involving the acetabular labra. Mild lumbar spondylosis and degenerative disc disease. 3. Aortic atherosclerosis.     10/18/2020 Surgery   Exploratory laparotomy, bilateral salpingo-oophorectomy, omentectomy,  radical retroperitoneal dissection for tumor debulking   10/18/2020 Pathology Results   A: Omentum, omentectomy - Metastatic high grade serous carcinoma, nodules measuring up to 2.7 cm (stage ypT3c), predominantly viable with focal fibrosis and hemosiderin laden macrophages (possible mild treatment effect)  B: Ovary and fallopian tube, right, salpingo-oophorectomy - High grade serous carcinoma involving right ovary and fallopian tube with surface involvement, size up to 8 cm  - Areas of necrosis and hemosiderin laden macrophages suggestive of treatment effect - 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 - See synoptic report and comment  C: Ovary and fallopian tube, left, salpingo-oophorectomy - High grade serous carcinoma involving the left fallopian tube, size up to 0.5 cm - Ovary with no definite involvement by carcinoma identified - Nodular area of endometriosis and peritoneal inclusion cyst also  present - See synoptic report and comment   Procedure:    Bilateral salpingo-oophorectomy    Procedure:    Omentectomy    Specimen Integrity of Right Ovary:    Intact with surface involvement by tumor    Specimen Integrity of Left Ovary:    Capsule intact   TUMOR Tumor Site:    Right fallopian tube: favor as primary site; also involves right ovary and left fallopian tube  Histologic Type:    Serous carcinoma  Histologic Grade:    High grade  Tumor Size:    Greatest Dimension (Centimeters): in fallopian tube: 1.0 cm; in ovary: 8.0 cm Ovarian Surface Involvement:    Present    Laterality:    Right  Fallopian Tube Surface Involvement:    Present    Laterality:    Bilateral  Other Tissue / Organ Involvement:    Right ovary  Other Tissue / Organ Involvement:    Right fallopian tube  Other Tissue / Organ Involvement:    Left fallopian tube  Other Tissue / Organ Involvement:    Omentum  Largest Extrapelvic Peritoneal Focus:    Macroscopic (greater than 2  cm)  Peritoneal / Ascitic Fluid:    Not submitted / unknown  Pleural Fluid:    Not submitted / unknown  Treatment Effect:    No definite or minimal response identified (chemotherapy response score [CRS] 1)   LYMPH NODES Regional Lymph Nodes:    No lymph nodes submitted or found   PATHOLOGIC STAGE CLASSIFICATION (pTNM, AJCC 8th Edition) TNM Descriptors:    y (post-treatment)  Primary Tumor (pT):    pT3c  Regional Lymph Nodes (pN):    pNX   FIGO STAGE FIGO Stage:    IIIC   ADDITIONAL FINDINGS Additional Findings:    Serous tubal intraepithelial carcinoma (STIC)  Additional Findings:    Left ovary with no definite carcinoma identified, left-sided nodule of endometriosis and peritoneal inclusion cyst    10/18/2020 - 10/20/2020 Hospital Admission   She was admitted to [facility] for interval debulking surgery   11/19/2020 Tumor Marker   Patient's tumor was tested for the following markers: CA-125. Results of the tumor marker test revealed 38.3   12/12/2020 Tumor Marker   Patient's tumor was tested for the following markers CA-125. Results of the tumor marker test revealed 42.3.   01/02/2021 Tumor Marker   Patient's tumor was tested for the following markers: CA-125. Results of the tumor marker test revealed 37.3   01/29/2021 Tumor Marker   Patient's tumor was tested for the following markers: CA-125 Results of the tumor marker test revealed 44.3   01/29/2021 Imaging   1. Interval increase in loculated appearing ascites in the low central abdomen and pelvis. 2. There is extensive peritoneal nodularity surrounding this fluid, the nodularity itself not appreciably changed in appearance compared to prior examination. 3. Additional peritoneal nodularity and/or mesenteric lymph nodes are unchanged. 4. Interval decrease in size of a low-attenuation nodule overlying the right hemidiaphragm, now measuring 1.2 cm, previously 1.8 cm when measured similarly. Findings are consistent with  treatment response of a metastatic nodule. No other evidence of intrathoracic metastatic disease. 5. Status post hysterectomy, oophorectomy, and omentectomy.   01/31/2021 - 08/26/2021 Chemotherapy   She is started on Olaparib       03/04/2021 Tumor Marker   Patient's tumor was tested for the following markers: CA-125 Results of the tumor marker test revealed 39.7   04/01/2021 Tumor Marker   Patient's tumor was  tested for the following markers: CA-125 Results of the tumor marker test revealed 32.3   05/10/2021 Imaging   1. No significant change in peritoneal carcinomatosis since previous study of 3 months ago, primarily in the pelvis where there is complex fluid and peritoneal nodularity. 2. No evidence of solid visceral organ metastasis. No evidence of bowel or ureteral obstruction. 3.  Aortic Atherosclerosis (ICD10-I70.0).   06/13/2021 Tumor Marker   Patient's tumor was tested for the following markers: CA-125. Results of the tumor marker test revealed 48.3.   07/29/2021 Tumor Marker   Patient's tumor was tested for the following markers: CA-125. Results of the tumor marker test revealed 54.   08/25/2021 Imaging   1. There is extensive peritoneal soft tissue thickening and nodularity, particularly about the low right hemipelvis, which is similar to prior examination. Some small peritoneal nodules throughout the abdomen and pelvis are slightly enlarged, other nodules are unchanged. 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 2. No significant change in a subpleural nodule overlying the right hemidiaphragm. 3. Unchanged, loculated small volume pelvic ascites. 4. Status post hysterectomy, oophorectomy, and omentectomy. 5. Coronary artery disease.   Aortic Atherosclerosis (ICD10-I70.0).     08/26/2021 Tumor Marker   Patient's tumor was tested for the following markers: CA-125. Results of the tumor marker test revealed 56.   09/04/2021 Tumor  Marker   Patient's tumor was tested for the following markers: CA-125. Results of the tumor marker test revealed 55.4.   09/05/2021 - 05/21/2022 Chemotherapy   Patient is on Treatment Plan : OVARIAN RECURRENT 3RD LINE Carboplatin D1 / Gemcitabine D1,8 (4/800) q21d     10/07/2021 Tumor Marker   Patient's tumor was tested for the following markers: CA-125. Results of the tumor marker test revealed 51.9.   11/05/2021 Tumor Marker   Patient's tumor was tested for the following markers: CA-125. Results of the tumor marker test revealed 45.6.   11/27/2021 Imaging   1. Overall stable to slightly improved ill-defined soft tissue density throughout the pelvis. 2. Slight interval decrease in size of the omental lesions. 3. Stable free pelvic fluid. 4. New surgical changes from a right hip replacement. 5. New nodular and somewhat triangular density at the left lung base is likely an area of atelectasis. 6. Aortic atherosclerosis.   12/24/2021 Tumor Marker   Patient's tumor was tested for the following markers: CA-125. Results of the tumor marker test revealed 39.6.   02/04/2022 Tumor Marker   Patient's tumor was tested for the following markers: CA-125. Results of the tumor marker test revealed 36.3.   03/05/2022 Imaging   1. Similar to mild interval increase in size of right pericolic goiter nodules and omental nodules. 2. Similar appearance of the amorphous soft tissue within the pelvis. There is decreased fluid component   03/06/2022 Tumor Marker   Patient's tumor was tested for the following markers: CA-125. Results of the tumor marker test revealed 56.2.   04/07/2022 Tumor Marker   Patient's tumor was tested for the following markers: CA-125. Results of the tumor marker test revealed 65.5.   05/13/2022 Tumor Marker   Patient's tumor was tested for the following markers: CA-125. Results of the tumor marker test revealed 66.5.   06/12/2022 Imaging   1. Slight increase in prominence of the  peritoneal and omental tumor deposits, which are most thickly banded in the lower pelvis. 2. Questionable wall thickening in the descending duodenum, cannot exclude duodenitis. 3. Other imaging findings of potential clinical significance: Bibasilar  scarring. Aortic Atherosclerosis (ICD10-I70.0). Cholelithiasis.   07/08/2022 - 07/29/2022 Chemotherapy   Patient is on Treatment Plan : ovarian Docetaxel q21d     07/08/2022 -  Chemotherapy   Patient is on Treatment Plan : BREAST Docetaxel (100) q21d     07/09/2022 Tumor Marker   Patient's tumor was tested for the following markers: CA-125. Results of the tumor marker test revealed 103.Marland Kitchen     PHYSICAL EXAMINATION: ECOG PERFORMANCE STATUS: 2 - Symptomatic, <50% confined to bed  Vitals:   08/19/22 0958  BP: 134/61  Pulse: 94  Resp: 18  Temp: (!) 97.4 F (36.3 C)  SpO2: 100%   Filed Weights   08/19/22 0958  Weight: 151 lb (68.5 kg)    GENERAL:alert, no distress and comfortable HEART: regular rate & rhythm and no murmurs with profound bilateral lower extremity edema ABDOMEN:abdomen appears distended with ascites Musculoskeletal:no cyanosis of digits and no clubbing  NEURO: alert & oriented x 3 with fluent speech, no focal motor/sensory deficits  LABORATORY DATA:  I have reviewed the data as listed    Component Value Date/Time   NA 138 08/19/2022 0926   NA 141 11/06/2016 1102   K 3.9 08/19/2022 0926   K 3.9 11/06/2016 1102   CL 109 08/19/2022 0926   CO2 22 08/19/2022 0926   CO2 27 11/06/2016 1102   GLUCOSE 188 (H) 08/19/2022 0926   GLUCOSE 130 11/06/2016 1102   BUN 25 (H) 08/19/2022 0926   BUN 18.0 11/06/2016 1102   CREATININE 1.23 (H) 08/19/2022 0926   CREATININE 1.16 (H) 06/16/2022 1038   CREATININE 1.3 (H) 11/06/2016 1102   CALCIUM 8.9 08/19/2022 0926   CALCIUM 10.2 11/06/2016 1102   PROT 6.3 (L) 08/19/2022 0926   PROT 7.1 11/06/2016 1102   ALBUMIN 2.6 (L) 08/19/2022 0926   ALBUMIN 3.7 11/06/2016 1102   AST 42 (H)  08/19/2022 0926   AST 52 (H) 11/06/2016 1102   ALT 23 08/19/2022 0926   ALT 58 (H) 11/06/2016 1102   ALKPHOS 132 (H) 08/19/2022 0926   ALKPHOS 159 (H) 11/06/2016 1102   BILITOT 0.7 08/19/2022 0926   BILITOT 0.53 11/06/2016 1102   GFRNONAA 43 (L) 08/19/2022 0926   GFRNONAA 34 (L) 02/21/2021 1414   GFRAA 40 (L) 02/21/2021 1414    No results found for: "SPEP", "UPEP"  Lab Results  Component Value Date   WBC 7.7 08/19/2022   NEUTROABS 6.3 08/19/2022   HGB 8.3 (L) 08/19/2022   HCT 25.0 (L) 08/19/2022   MCV 94.3 08/19/2022   PLT 152 08/19/2022      Chemistry      Component Value Date/Time   NA 138 08/19/2022 0926   NA 141 11/06/2016 1102   K 3.9 08/19/2022 0926   K 3.9 11/06/2016 1102   CL 109 08/19/2022 0926   CO2 22 08/19/2022 0926   CO2 27 11/06/2016 1102   BUN 25 (H) 08/19/2022 0926   BUN 18.0 11/06/2016 1102   CREATININE 1.23 (H) 08/19/2022 0926   CREATININE 1.16 (H) 06/16/2022 1038   CREATININE 1.3 (H) 11/06/2016 1102      Component Value Date/Time   CALCIUM 8.9 08/19/2022 0926   CALCIUM 10.2 11/06/2016 1102   ALKPHOS 132 (H) 08/19/2022 0926   ALKPHOS 159 (H) 11/06/2016 1102   AST 42 (H) 08/19/2022 0926   AST 52 (H) 11/06/2016 1102   ALT 23 08/19/2022 0926   ALT 58 (H) 11/06/2016 1102   BILITOT 0.7 08/19/2022 0926   BILITOT 0.53 11/06/2016  1102       RADIOGRAPHIC STUDIES: I have personally reviewed the radiological images as listed and agreed with the findings in the report. CT ABDOMEN PELVIS WO CONTRAST  Result Date: 08/05/2022 CLINICAL DATA:  Left-sided abdominal pain. History of ovarian cancer. * Tracking Code: BO * EXAM: CT ABDOMEN AND PELVIS WITHOUT CONTRAST TECHNIQUE: Multidetector CT imaging of the abdomen and pelvis was performed following the standard protocol without IV contrast. RADIATION DOSE REDUCTION: This exam was performed according to the departmental dose-optimization program which includes automated exposure control, adjustment of the mA  and/or kV according to patient size and/or use of iterative reconstruction technique. COMPARISON:  06/11/2022 FINDINGS: Lower chest: The lung bases are clear of an acute process. Minimal streaky bibasilar atelectasis. The heart is normal in size. No pericardial effusion. Hepatobiliary: No hepatic lesions are identified without contrast. No intrahepatic biliary dilatation. Small layering gallstones suspected in the gallbladder. No findings for acute cholecystitis. No common bile duct dilatation. Pancreas: Diffuse inflammation/edema in and around the pancreas consistent with diffuse interstitial pancreatitis. Spleen: Stable mild splenomegaly. Adrenals/Urinary Tract: Adrenal glands and kidneys are. Bladder is grossly normal. Stomach/Bowel: The stomach, duodenum, small bowel and colon are grossly no. Vascular/Lymphatic: Moderate atherosclerotic calcification involving the aorta but no aneurysm. Stable scattered mesenteric and retroperitoneal lymph nodes and progressive peritoneal carcinomatosis. Numerous omental and peritoneal implants slightly progressive when compared to the prior study. The soft tissue lesion in the right lower quadrant adjacent to the ascending colon measures 2.6 x 2.1 cm and previously measured 2.5 x 2.0 cm. Multiple pelvic implants are grossly stable to slightly enlarged. Reproductive: Surgically absent. Other: Small amount of free pelvic fluid noted. Musculoskeletal: No significant bony findings. IMPRESSION: 1. 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 2. Slight interval progression of peritoneal carcinomatosis. Aortic Atherosclerosis (ICD10-I70.0). Electronically Signed   By: Marijo Sanes M.D.   On: 08/05/2022 14:20   DG Chest Port 1 View  Result Date: 08/05/2022 CLINICAL DATA:  Weakness with decreased p.o. intake. EXAM: PORTABLE CHEST 1 VIEW COMPARISON:  November 07, 2021 FINDINGS: EKG leads project over the chest. RIGHT-sided Port-A-Cath terminating at the caval to  atrial junction. Mild rotation to the RIGHT. Accounting for this cardiomediastinal contours and hilar structures are normal. Lungs are clear.  No sign of effusion.  No pneumothorax. Post LEFT breast lumpectomy. On limited assessment no acute skeletal findings. IMPRESSION: 1. No acute cardiopulmonary disease. Electronically Signed   By: Zetta Bills M.D.   On: 08/05/2022 09:59

## 2022-08-19 NOTE — Assessment & Plan Note (Signed)
She has severe protein calorie malnutrition She has significant third spacing I recommend resumption of diuretic and high-protein diet

## 2022-08-19 NOTE — Assessment & Plan Note (Signed)
She had acute renal failure that improved with discontinuation of hydrochlorothiazide Due to significant fluid retention, recommend resumption of diuretic

## 2022-08-19 NOTE — Assessment & Plan Note (Signed)
She is weak and debilitated She has gained a lot of fluids due to fluid retention secondary to third spacing Her blood counts are barely recovered I do not recommend we proceed with treatment today I plan to delay her treatment to next week and to consider adding G-CSF support due to recent infection and hospitalization She might need further dose reduction in the future depending on her recovery

## 2022-08-19 NOTE — Assessment & Plan Note (Signed)
Her blood count improves but she was very anemic and required blood transfusion while hospitalized Observe closely

## 2022-08-20 ENCOUNTER — Encounter: Payer: Self-pay | Admitting: Nurse Practitioner

## 2022-08-20 ENCOUNTER — Ambulatory Visit (INDEPENDENT_AMBULATORY_CARE_PROVIDER_SITE_OTHER): Payer: PPO | Admitting: Nurse Practitioner

## 2022-08-20 VITALS — BP 120/68 | HR 86 | Temp 97.3°F | Ht 64.0 in | Wt 151.2 lb

## 2022-08-20 DIAGNOSIS — E46 Unspecified protein-calorie malnutrition: Secondary | ICD-10-CM | POA: Diagnosis not present

## 2022-08-20 DIAGNOSIS — K851 Biliary acute pancreatitis without necrosis or infection: Secondary | ICD-10-CM | POA: Diagnosis not present

## 2022-08-20 DIAGNOSIS — D708 Other neutropenia: Secondary | ICD-10-CM | POA: Diagnosis not present

## 2022-08-20 DIAGNOSIS — N179 Acute kidney failure, unspecified: Secondary | ICD-10-CM

## 2022-08-20 DIAGNOSIS — C569 Malignant neoplasm of unspecified ovary: Secondary | ICD-10-CM | POA: Diagnosis not present

## 2022-08-20 DIAGNOSIS — I10 Essential (primary) hypertension: Secondary | ICD-10-CM

## 2022-08-20 DIAGNOSIS — R600 Localized edema: Secondary | ICD-10-CM | POA: Diagnosis not present

## 2022-08-20 DIAGNOSIS — N1832 Chronic kidney disease, stage 3b: Secondary | ICD-10-CM

## 2022-08-20 DIAGNOSIS — N183 Chronic kidney disease, stage 3 unspecified: Secondary | ICD-10-CM | POA: Diagnosis not present

## 2022-08-20 DIAGNOSIS — L600 Ingrowing nail: Secondary | ICD-10-CM

## 2022-08-20 DIAGNOSIS — E1122 Type 2 diabetes mellitus with diabetic chronic kidney disease: Secondary | ICD-10-CM

## 2022-08-20 DIAGNOSIS — D61818 Other pancytopenia: Secondary | ICD-10-CM

## 2022-08-20 MED ORDER — HYDROCHLOROTHIAZIDE 25 MG PO TABS: 25.0000 mg | ORAL_TABLET | Freq: Every day | ORAL | 2 refills | Status: DC

## 2022-08-20 NOTE — Patient Instructions (Signed)
Edema  Edema is when you have too much fluid in your body or under your skin. Edema may make your legs, feet, and ankles swell. Swelling often happens in looser tissues, such as around your eyes. This is a common condition. It gets more common as you get older. There are many possible causes of edema. These include: Eating too much salt (sodium). Being on your feet or sitting for a long time. Certain medical conditions, such as: Pregnancy. Heart failure. Liver disease. Kidney disease. Cancer. Hot weather may make edema worse. Edema is usually painless. Your skin may look swollen or shiny. Follow these instructions at home: Medicines Take over-the-counter and prescription medicines only as told by your doctor. Your doctor may prescribe a medicine to help your body get rid of extra water (diuretic). Take this medicine if you are told to take it. Eating and drinking Eat a low-salt (low-sodium) diet as told by your doctor. Sometimes, eating less salt may reduce swelling. Depending on the cause of your swelling, you may need to limit how much fluid you drink (fluid restriction). General instructions Raise the injured area above the level of your heart while you are sitting or lying down. Do not sit still or stand for a long time. Do not wear tight clothes. Do not wear garters on your upper legs. Exercise your legs. This can help the swelling go down. Wear compression stockings as told by your doctor. It is important that these are the right size. These should be prescribed by your doctor to prevent possible injuries. If elastic bandages or wraps are recommended, use them as told by your doctor. Contact a doctor if: Treatment is not working. You have heart, liver, or kidney disease and have symptoms of edema. You have sudden and unexplained weight gain. Get help right away if: You have shortness of breath or chest pain. You cannot breathe when you lie down. You have pain, redness, or  warmth in the swollen areas. You have heart, liver, or kidney disease and get edema all of a sudden. You have a fever and your symptoms get worse all of a sudden. These symptoms may be an emergency. Get help right away. Call 911. Do not wait to see if the symptoms will go away. Do not drive yourself to the hospital. Summary Edema is when you have too much fluid in your body or under your skin. Edema may make your legs, feet, and ankles swell. Swelling often happens in looser tissues, such as around your eyes. Raise the injured area above the level of your heart while you are sitting or lying down. Follow your doctor's instructions about diet and how much fluid you can drink. This information is not intended to replace advice given to you by your health care provider. Make sure you discuss any questions you have with your health care provider. Document Revised: 07/22/2021 Document Reviewed: 07/22/2021 Elsevier Patient Education  [employer].

## 2022-08-21 LAB — CA 125: Cancer Antigen (CA) 125: 109 U/mL — ABNORMAL HIGH (ref 0.0–38.1)

## 2022-08-22 ENCOUNTER — Emergency Department (HOSPITAL_BASED_OUTPATIENT_CLINIC_OR_DEPARTMENT_OTHER)
Admit: 2022-08-22 | Discharge: 2022-08-22 | Disposition: A | Discharge status: Home / Self Care | Payer: PPO | Attending: Emergency Medicine | Admitting: Emergency Medicine

## 2022-08-22 ENCOUNTER — Emergency Department (HOSPITAL_COMMUNITY): Payer: PPO

## 2022-08-22 ENCOUNTER — Encounter (HOSPITAL_COMMUNITY): Payer: Self-pay

## 2022-08-22 ENCOUNTER — Emergency Department (HOSPITAL_COMMUNITY)
Admission: EM | Admit: 2022-08-22 | Discharge: 2022-08-22 | Disposition: A | Discharge status: Home / Self Care | Payer: PPO | Source: Home / Self Care | Attending: Emergency Medicine | Admitting: Emergency Medicine

## 2022-08-22 ENCOUNTER — Other Ambulatory Visit: Payer: Self-pay

## 2022-08-22 DIAGNOSIS — Z96641 Presence of right artificial hip joint: Secondary | ICD-10-CM | POA: Diagnosis not present

## 2022-08-22 DIAGNOSIS — T8459XA Infection and inflammatory reaction due to other internal joint prosthesis, initial encounter: Secondary | ICD-10-CM | POA: Diagnosis not present

## 2022-08-22 DIAGNOSIS — R531 Weakness: Secondary | ICD-10-CM | POA: Insufficient documentation

## 2022-08-22 DIAGNOSIS — E1122 Type 2 diabetes mellitus with diabetic chronic kidney disease: Secondary | ICD-10-CM | POA: Diagnosis present

## 2022-08-22 DIAGNOSIS — K859 Acute pancreatitis without necrosis or infection, unspecified: Secondary | ICD-10-CM | POA: Diagnosis present

## 2022-08-22 DIAGNOSIS — E1169 Type 2 diabetes mellitus with other specified complication: Secondary | ICD-10-CM | POA: Diagnosis present

## 2022-08-22 DIAGNOSIS — Z471 Aftercare following joint replacement surgery: Secondary | ICD-10-CM | POA: Diagnosis not present

## 2022-08-22 DIAGNOSIS — R609 Edema, unspecified: Secondary | ICD-10-CM

## 2022-08-22 DIAGNOSIS — I10 Essential (primary) hypertension: Secondary | ICD-10-CM | POA: Insufficient documentation

## 2022-08-22 DIAGNOSIS — C569 Malignant neoplasm of unspecified ovary: Secondary | ICD-10-CM | POA: Diagnosis not present

## 2022-08-22 DIAGNOSIS — E440 Moderate protein-calorie malnutrition: Secondary | ICD-10-CM | POA: Diagnosis present

## 2022-08-22 DIAGNOSIS — D630 Anemia in neoplastic disease: Secondary | ICD-10-CM | POA: Diagnosis present

## 2022-08-22 DIAGNOSIS — L02419 Cutaneous abscess of limb, unspecified: Secondary | ICD-10-CM | POA: Diagnosis not present

## 2022-08-22 DIAGNOSIS — E119 Type 2 diabetes mellitus without complications: Secondary | ICD-10-CM | POA: Diagnosis not present

## 2022-08-22 DIAGNOSIS — C786 Secondary malignant neoplasm of retroperitoneum and peritoneum: Secondary | ICD-10-CM | POA: Diagnosis present

## 2022-08-22 DIAGNOSIS — Z7984 Long term (current) use of oral hypoglycemic drugs: Secondary | ICD-10-CM | POA: Diagnosis not present

## 2022-08-22 DIAGNOSIS — I129 Hypertensive chronic kidney disease with stage 1 through stage 4 chronic kidney disease, or unspecified chronic kidney disease: Secondary | ICD-10-CM | POA: Diagnosis present

## 2022-08-22 DIAGNOSIS — Z881 Allergy status to other antibiotic agents status: Secondary | ICD-10-CM | POA: Diagnosis not present

## 2022-08-22 DIAGNOSIS — R109 Unspecified abdominal pain: Secondary | ICD-10-CM | POA: Insufficient documentation

## 2022-08-22 DIAGNOSIS — M25451 Effusion, right hip: Secondary | ICD-10-CM | POA: Insufficient documentation

## 2022-08-22 DIAGNOSIS — K7460 Unspecified cirrhosis of liver: Secondary | ICD-10-CM | POA: Diagnosis present

## 2022-08-22 DIAGNOSIS — L02415 Cutaneous abscess of right lower limb: Secondary | ICD-10-CM | POA: Diagnosis present

## 2022-08-22 DIAGNOSIS — E43 Unspecified severe protein-calorie malnutrition: Secondary | ICD-10-CM | POA: Diagnosis not present

## 2022-08-22 DIAGNOSIS — K5730 Diverticulosis of large intestine without perforation or abscess without bleeding: Secondary | ICD-10-CM | POA: Diagnosis not present

## 2022-08-22 DIAGNOSIS — T8459XD Infection and inflammatory reaction due to other internal joint prosthesis, subsequent encounter: Secondary | ICD-10-CM | POA: Diagnosis not present

## 2022-08-22 DIAGNOSIS — M1000 Idiopathic gout, unspecified site: Secondary | ICD-10-CM | POA: Diagnosis not present

## 2022-08-22 DIAGNOSIS — T8453XA Infection and inflammatory reaction due to internal right knee prosthesis, initial encounter: Secondary | ICD-10-CM | POA: Diagnosis not present

## 2022-08-22 DIAGNOSIS — R1084 Generalized abdominal pain: Secondary | ICD-10-CM | POA: Diagnosis not present

## 2022-08-22 DIAGNOSIS — R180 Malignant ascites: Secondary | ICD-10-CM | POA: Diagnosis present

## 2022-08-22 DIAGNOSIS — D649 Anemia, unspecified: Secondary | ICD-10-CM | POA: Diagnosis not present

## 2022-08-22 DIAGNOSIS — E785 Hyperlipidemia, unspecified: Secondary | ICD-10-CM | POA: Diagnosis present

## 2022-08-22 DIAGNOSIS — R188 Other ascites: Secondary | ICD-10-CM | POA: Diagnosis not present

## 2022-08-22 DIAGNOSIS — Y792 Prosthetic and other implants, materials and accessory orthopedic devices associated with adverse incidents: Secondary | ICD-10-CM | POA: Diagnosis present

## 2022-08-22 DIAGNOSIS — N390 Urinary tract infection, site not specified: Secondary | ICD-10-CM | POA: Diagnosis not present

## 2022-08-22 DIAGNOSIS — Z8543 Personal history of malignant neoplasm of ovary: Secondary | ICD-10-CM | POA: Insufficient documentation

## 2022-08-22 DIAGNOSIS — T8451XA Infection and inflammatory reaction due to internal right hip prosthesis, initial encounter: Secondary | ICD-10-CM | POA: Diagnosis present

## 2022-08-22 DIAGNOSIS — E876 Hypokalemia: Secondary | ICD-10-CM | POA: Diagnosis present

## 2022-08-22 DIAGNOSIS — R4182 Altered mental status, unspecified: Secondary | ICD-10-CM | POA: Diagnosis not present

## 2022-08-22 DIAGNOSIS — Z79899 Other long term (current) drug therapy: Secondary | ICD-10-CM | POA: Diagnosis not present

## 2022-08-22 DIAGNOSIS — I739 Peripheral vascular disease, unspecified: Secondary | ICD-10-CM | POA: Diagnosis not present

## 2022-08-22 DIAGNOSIS — K766 Portal hypertension: Secondary | ICD-10-CM | POA: Diagnosis present

## 2022-08-22 DIAGNOSIS — C482 Malignant neoplasm of peritoneum, unspecified: Secondary | ICD-10-CM | POA: Diagnosis not present

## 2022-08-22 DIAGNOSIS — R161 Splenomegaly, not elsewhere classified: Secondary | ICD-10-CM | POA: Diagnosis not present

## 2022-08-22 DIAGNOSIS — M79604 Pain in right leg: Secondary | ICD-10-CM | POA: Diagnosis not present

## 2022-08-22 DIAGNOSIS — N3000 Acute cystitis without hematuria: Secondary | ICD-10-CM | POA: Diagnosis present

## 2022-08-22 DIAGNOSIS — Z853 Personal history of malignant neoplasm of breast: Secondary | ICD-10-CM | POA: Diagnosis not present

## 2022-08-22 DIAGNOSIS — Y831 Surgical operation with implant of artificial internal device as the cause of abnormal reaction of the patient, or of later complication, without mention of misadventure at the time of the procedure: Secondary | ICD-10-CM | POA: Diagnosis present

## 2022-08-22 DIAGNOSIS — M1990 Unspecified osteoarthritis, unspecified site: Secondary | ICD-10-CM | POA: Diagnosis present

## 2022-08-22 DIAGNOSIS — T8451XD Infection and inflammatory reaction due to internal right hip prosthesis, subsequent encounter: Secondary | ICD-10-CM | POA: Diagnosis not present

## 2022-08-22 DIAGNOSIS — D696 Thrombocytopenia, unspecified: Secondary | ICD-10-CM | POA: Diagnosis not present

## 2022-08-22 DIAGNOSIS — N1832 Chronic kidney disease, stage 3b: Secondary | ICD-10-CM | POA: Diagnosis present

## 2022-08-22 DIAGNOSIS — D61818 Other pancytopenia: Secondary | ICD-10-CM | POA: Diagnosis not present

## 2022-08-22 DIAGNOSIS — E8809 Other disorders of plasma-protein metabolism, not elsewhere classified: Secondary | ICD-10-CM | POA: Diagnosis present

## 2022-08-22 DIAGNOSIS — M25551 Pain in right hip: Secondary | ICD-10-CM | POA: Diagnosis present

## 2022-08-22 DIAGNOSIS — M109 Gout, unspecified: Secondary | ICD-10-CM | POA: Diagnosis present

## 2022-08-22 LAB — URINALYSIS, ROUTINE W REFLEX MICROSCOPIC
Bilirubin Urine: NEGATIVE
Glucose, UA: NEGATIVE mg/dL
Ketones, ur: NEGATIVE mg/dL
Nitrite: NEGATIVE
Protein, ur: NEGATIVE mg/dL
Specific Gravity, Urine: 1.011 (ref 1.005–1.030)
pH: 5 (ref 5.0–8.0)

## 2022-08-22 LAB — BASIC METABOLIC PANEL
Anion gap: 6 (ref 5–15)
BUN: 39 mg/dL — ABNORMAL HIGH (ref 8–23)
CO2: 24 mmol/L (ref 22–32)
Calcium: 9 mg/dL (ref 8.9–10.3)
Chloride: 108 mmol/L (ref 98–111)
Creatinine, Ser: 1.11 mg/dL — ABNORMAL HIGH (ref 0.44–1.00)
GFR, Estimated: 49 mL/min — ABNORMAL LOW (ref >60)
Glucose, Bld: 124 mg/dL — ABNORMAL HIGH (ref 70–99)
Potassium: 3.5 mmol/L (ref 3.5–5.1)
Sodium: 138 mmol/L (ref 135–145)

## 2022-08-22 LAB — HEPATIC FUNCTION PANEL
ALT: 34 U/L (ref 0–44)
AST: 57 U/L — ABNORMAL HIGH (ref 15–41)
Albumin: 2.1 g/dL — ABNORMAL LOW (ref 3.5–5.0)
Alkaline Phosphatase: 107 U/L (ref 38–126)
Bilirubin, Direct: 0.2 mg/dL (ref 0.0–0.2)
Indirect Bilirubin: 0.6 mg/dL (ref 0.3–0.9)
Total Bilirubin: 0.8 mg/dL (ref 0.3–1.2)
Total Protein: 5.9 g/dL — ABNORMAL LOW (ref 6.5–8.1)

## 2022-08-22 LAB — CBG MONITORING, ED: Glucose-Capillary: 108 mg/dL — ABNORMAL HIGH (ref 70–99)

## 2022-08-22 LAB — CBC
HCT: 25.5 % — ABNORMAL LOW (ref 36.0–46.0)
Hemoglobin: 8.1 g/dL — ABNORMAL LOW (ref 12.0–15.0)
MCH: 31.4 pg (ref 26.0–34.0)
MCHC: 31.8 g/dL (ref 30.0–36.0)
MCV: 98.8 fL (ref 80.0–100.0)
Platelets: 126 10*3/uL — ABNORMAL LOW (ref 150–400)
RBC: 2.58 MIL/uL — ABNORMAL LOW (ref 3.87–5.11)
RDW: 18.8 % — ABNORMAL HIGH (ref 11.5–15.5)
WBC: 4.6 10*3/uL (ref 4.0–10.5)
nRBC: 0.7 % — ABNORMAL HIGH (ref 0.0–0.2)

## 2022-08-22 LAB — LIPASE, BLOOD: Lipase: 46 U/L (ref 11–51)

## 2022-08-22 MED ORDER — IOHEXOL 300 MG/ML  SOLN
100.0000 mL | Freq: Once | INTRAMUSCULAR | Status: AC | PRN
  Administered 2022-08-22: 100 mL via INTRAVENOUS

## 2022-08-22 MED ORDER — SODIUM CHLORIDE 0.9 % IV BOLUS
1000.0000 mL | Freq: Once | INTRAVENOUS | Status: AC
  Administered 2022-08-22: 1000 mL via INTRAVENOUS

## 2022-08-22 MED ORDER — FENTANYL CITRATE PF 50 MCG/ML IJ SOSY
50.0000 ug | PREFILLED_SYRINGE | Freq: Once | INTRAMUSCULAR | Status: AC
  Administered 2022-08-22: 50 ug via INTRAVENOUS
  Filled 2022-08-22: qty 1

## 2022-08-22 MED ORDER — HEPARIN SOD (PORK) LOCK FLUSH 100 UNIT/ML IV SOLN
500.0000 [IU] | Freq: Once | INTRAVENOUS | Status: AC
  Administered 2022-08-22: 500 [IU]
  Filled 2022-08-22: qty 5

## 2022-08-22 NOTE — Progress Notes (Signed)
Right lower extremity venous duplex has been completed. Preliminary results can be found in CV Proc through chart review.  Results were given to Dr. Regenia Skeeter.  08/22/22 3:04 PM Kayla Baker RVT

## 2022-08-22 NOTE — Discharge Instructions (Signed)
Please follow-up with your oncologist and orthopedist in the office.  Please call them first thing on Monday and discuss your visit here today.  Please return to the Emergency Department for worsening pain redness or if you develop a fever.

## 2022-08-22 NOTE — ED Provider Notes (Signed)
[place] DEPT Provider Note   CSN: [id number] Arrival date & time: 08/22/22  1041     History  Chief Complaint  Patient presents with   Weakness    Kayla Baker is a 86 y.o. female.  HPI 86 year old female with a history of ovarian cancer currently being treated presents with abdominal pain.  Feels like pancreatitis she had earlier in the month for when she was admitted and discharged.  No fevers, chest pain, shortness of breath, dysuria, or diarrhea.  No vomiting.  Pain is about a 7 out of 10.  She is also developed right hip pain.  Had a right hip surgically repaired about a year ago and it started hurting all of a sudden today.  She states that she has been having some leg swelling in her right leg since she was in the hospital but it is a little bit improved.  She has to have her husband lift her right leg due to the pain for the past month or so.  However once it is up but does not feel like it is weak.  She is feeling weaker today.  Home Medications Prior to Admission medications   Medication Sig Start Date End Date Taking? Authorizing Provider  acetaminophen (TYLENOL) 325 MG tablet Take 325 mg by mouth every 6 (six) hours as needed for headache.    [provider]  allopurinol (ZYLOPRIM) 100 MG tablet Take 1 tablet (100 mg total) by mouth daily. FOR GOUT PREVENTION, Patient taking differently: Take 100 mg by mouth daily. 03/25/22   Liane Comber, NP  amoxicillin (AMOXIL) 500 MG tablet Take 2,000 mg by mouth See admin instructions. Take prior to dental cleanings 03/03/22   [provider]  Cholecalciferol (VITAMIN D3) 5000 units CAPS Take 5,000 Units by mouth daily.    [provider]  cyanocobalamin 1000 MCG tablet Take 1,000 mcg by mouth daily.    [provider]  dexamethasone (DECADRON) 4 MG tablet Take 2 tablets the morning before chemotherapy and then 2 tablets daily for 2 days after chemo Patient not taking:  Reported on 08/20/2022 07/01/22   Heath Lark, MD  ezetimibe (ZETIA) 10 MG tablet TAKE 1 TABLET BY MOUTH DAILY FOR CHOLESTEROL Patient taking differently: Take 10 mg by mouth daily. 04/24/22   Liane Comber, NP  gabapentin (NEURONTIN) 300 MG capsule Take 1 capsule 4 x  /day for Pain Patient taking differently: Take 300 mg by mouth 4 (four) times daily. 12/07/21   [name], MD  glimepiride (AMARYL) 1 MG tablet Take 1 tab by mouth in the morning if fasting is running 150+. Please do not take this medication if you are ill or not eating as this can cause a low blood sugar. Patient taking differently: Take 1 mg by mouth See admin instructions. Take 1 tab by mouth daily as needed if fasting is running 150+. Please do not take this medication if you are ill or not eating as this can cause a low blood sugar. 05/04/20   Liane Comber, NP  hydrochlorothiazide (HYDRODIURIL) 25 MG tablet Take 1 tablet (25 mg total) by mouth daily. 08/20/22   Alycia Rossetti, NP  lidocaine-prilocaine (EMLA) cream Apply 1 application. topically daily as needed (for port access). 03/07/22   Heath Lark, MD  magnesium oxide (MAG-OX) 400 (240 Mg) MG tablet Take 1 tablet (400 mg total) by mouth daily. 08/19/22   Heath Lark, MD  metoprolol succinate (TOPROL-XL) 25 MG 24 hr tablet Take  1 tablet  Daily  for BP       /TAKE 1 TABLET BY MOUTH DAILY FOR BLOOD PRESSURE Patient taking differently: Take 25 mg by mouth daily. 08/25/21   [name], MD  ondansetron (ZOFRAN) 8 MG tablet Take 1 tablet (8 mg total) by mouth every 8 (eight) hours as needed for refractory nausea / vomiting. 06/13/22   Heath Lark, MD  polyethylene glycol (MIRALAX) 17 g packet Take 17 g by mouth 2 (two) times daily. 17 grams in 6 oz of favorite drink twice a day until bowel movement.  LAXITIVE.  Restart if two days since last bowel movement Patient taking differently: Take 17 g by mouth 2 (two) times daily. 11/10/21   Shepperson, Kirstin, PA-C  rosuvastatin  (CRESTOR) 5 MG tablet TAKE 1 TABLET BY MOUTH IN THE EVENING THREE TIMES WEEKLY FOR CHOLESTEROL Patient taking differently: Take 5 mg by mouth See admin instructions. Monday, Wednesday and Friday 03/07/22   Liane Comber, NP  trolamine salicylate (ASPERCREME) 10 % cream Apply 1 application topically as needed for muscle pain (Use if you still feel tingles in feet in the morning after taking Gabapentin).    [provider]  vitamin C (ASCORBIC ACID) 500 MG tablet Take 500 mg by mouth every evening.     [provider]      Allergies    Keflex [cephalexin], Mobic [meloxicam], Prednisone, Premarin [estrogens conjugated], and Trovan [alatrofloxacin]    Review of Systems   Review of Systems  Constitutional:  Negative for fever.  Respiratory:  Negative for shortness of breath.   Cardiovascular:  Negative for chest pain.  Gastrointestinal:  Positive for abdominal pain. Negative for diarrhea and vomiting.  Genitourinary:  Negative for dysuria.  Musculoskeletal:  Positive for arthralgias.  Neurological:  Positive for weakness.    Physical Exam Updated Vital Signs BP 136/77   Pulse 100   Temp 98.8 F (37.1 C) (Oral)   Resp (!) 24   SpO2 97%  Physical Exam Vitals and nursing note reviewed.  Constitutional:      Appearance: She is well-developed.  HENT:     Head: Normocephalic and atraumatic.  Cardiovascular:     Rate and Rhythm: Normal rate and regular rhythm.     Heart sounds: Normal heart sounds.  Pulmonary:     Effort: Pulmonary effort is normal.     Breath sounds: Normal breath sounds.  Abdominal:     General: There is distension (lower abdominal).     Palpations: Abdomen is soft.     Tenderness: There is abdominal tenderness.  Musculoskeletal:     Comments: No significant tenderness in the right hip.  She has a hard time lifting her right leg off the stretcher in a straight leg raise.  However when I lifted and leg go she is able to hold it up with no  significant difficulty.  Right lower extremity is diffusely and mildly edematous.  No pain or range of motion abnormalities with her right hip.  Skin:    General: Skin is warm and dry.  Neurological:     Mental Status: She is alert.     ED Results / Procedures / Treatments   Labs (all labs ordered are listed, but only abnormal results are displayed) Labs Reviewed  BASIC METABOLIC PANEL - Abnormal; Notable for the following components:      Result Value   Glucose, Bld 124 (*)    BUN 39 (*)    Creatinine, Ser 1.11 (*)  GFR, Estimated 49 (*)    All other components within normal limits  CBC - Abnormal; Notable for the following components:   RBC 2.58 (*)    Hemoglobin 8.1 (*)    HCT 25.5 (*)    RDW 18.8 (*)    Platelets 126 (*)    nRBC 0.7 (*)    All other components within normal limits  CBG MONITORING, ED - Abnormal; Notable for the following components:   Glucose-Capillary 108 (*)    All other components within normal limits  URINALYSIS, ROUTINE W REFLEX MICROSCOPIC  HEPATIC FUNCTION PANEL  LIPASE, BLOOD  CBG MONITORING, ED    EKG EKG Interpretation  Date/Time:  Friday August 22 2022 11:43:51 EDT Ventricular Rate:  97 PR Interval:  152 QRS Duration: 80 QT Interval:  311 QTC Calculation: 395 R Axis:   26 Text Interpretation: Sinus rhythm nonspecific ST/T changes similar to Sept 5 2023 Confirmed by Sherwood Gambler [phone]) on 08/22/2022 2:20:37 PM  Radiology VAS [place] LOWER EXTREMITY VENOUS (DVT) (7a-7p)  Result Date: 08/22/2022  Lower Venous DVT Study Patient Name:  DORELLA LASTER Knabe  Date of Exam:   08/22/2022 Medical Rec #: [id number]    Accession #:    [id number] Date of Birth: February 13, 1936    Patient Gender: F Patient Age:   71 years Exam Location:  [facility] Procedure:      VAS [place] LOWER EXTREMITY VENOUS (DVT) Referring Phys: Nicki Reaper Darline Faith --------------------------------------------------------------------------------  Indications: Edema.  Limitations: Poor  ultrasound/tissue interface and patient positioning. Comparison Study: No prior studies. Performing Technologist: Oliver Hum RVT  Examination Guidelines: A complete evaluation includes B-mode imaging, spectral Doppler, color Doppler, and power Doppler as needed of all accessible portions of each vessel. Bilateral testing is considered an integral part of a complete examination. Limited examinations for reoccurring indications may be performed as noted. The reflux portion of the exam is performed with the patient in reverse Trendelenburg.  +---------+---------------+---------+-----------+----------+--------------+ RIGHT    CompressibilityPhasicitySpontaneityPropertiesThrombus Aging +---------+---------------+---------+-----------+----------+--------------+ CFV      Full           Yes      Yes                                 +---------+---------------+---------+-----------+----------+--------------+ SFJ      Full                                                        +---------+---------------+---------+-----------+----------+--------------+ FV Prox  Full                                                        +---------+---------------+---------+-----------+----------+--------------+ FV Mid   Full                                                        +---------+---------------+---------+-----------+----------+--------------+ FV Distal               Yes  Yes                                 +---------+---------------+---------+-----------+----------+--------------+ PFV      Full                                                        +---------+---------------+---------+-----------+----------+--------------+ POP      Full           Yes      Yes                                 +---------+---------------+---------+-----------+----------+--------------+ PTV      Full                                                         +---------+---------------+---------+-----------+----------+--------------+ PERO     Full                                                        +---------+---------------+---------+-----------+----------+--------------+   +----+---------------+---------+-----------+----------+--------------+ LEFTCompressibilityPhasicitySpontaneityPropertiesThrombus Aging +----+---------------+---------+-----------+----------+--------------+ CFV Full           Yes      Yes                                 +----+---------------+---------+-----------+----------+--------------+     Summary: RIGHT: - There is no evidence of deep vein thrombosis in the lower extremity. However, portions of this examination were limited- see technologist comments above.  - No cystic structure found in the popliteal fossa.  LEFT: - No evidence of common femoral vein obstruction.  *See table(s) above for measurements and observations.    Preliminary    CT Head Wo Contrast  Result Date: 08/22/2022 CLINICAL DATA:  Altered mental status, history of ovarian cancer EXAM: CT HEAD WITHOUT CONTRAST TECHNIQUE: Contiguous axial images were obtained from the base of the skull through the vertex without intravenous contrast. RADIATION DOSE REDUCTION: This exam was performed according to the departmental dose-optimization program which includes automated exposure control, adjustment of the mA and/or kV according to patient size and/or use of iterative reconstruction technique. COMPARISON:  None Available. FINDINGS: Brain: No evidence of acute infarction, hemorrhage, mass, mass effect, or midline shift. No hydrocephalus or extra-axial fluid collection. Periventricular white matter changes, likely the sequela of chronic small vessel ischemic disease. Vascular: No hyperdense vessel. Atherosclerotic calcifications in the intracranial carotid and vertebral arteries. Skull: Normal. Negative for fracture or focal lesion. Sinuses/Orbits: No acute  finding. Status post bilateral lens replacements. Other: The mastoid air cells are well aerated. IMPRESSION: No acute intracranial process. Electronically Signed   By: Merilyn Baba M.D.   On: 08/22/2022 13:07   DG Hip Unilat With Pelvis 2-3 Views Right  Result Date: 08/22/2022 CLINICAL DATA:  Right hip pain.  Previous hip replacement. EXAM: DG HIP (  WITH OR WITHOUT PELVIS) 2-3V RIGHT COMPARISON:  Right hip radiographs 11/08/2021 FINDINGS: Redemonstration of right hip bipolar hemiarthroplasty. No perihardware lucency is seen to indicate hardware failure or loosening. Mild superior left femoroacetabular joint space narrowing. Surgical clips overlie the left hemipelvis. IMPRESSION: Status post right hip arthroplasty without evidence of hardware failure. Electronically Signed   By: Yvonne Kendall M.D.   On: 08/22/2022 12:41    Procedures Procedures    Medications Ordered in ED Medications  sodium chloride 0.9 % bolus 1,000 mL (1,000 mLs Intravenous New Bag/Given 08/22/22 1548)  fentaNYL (SUBLIMAZE) injection 50 mcg (50 mcg Intravenous Given 08/22/22 1548)  iohexol (OMNIPAQUE) 300 MG/ML solution 100 mL (100 mLs Intravenous Contrast Given 08/22/22 1518)    ED Course/ Medical Decision Making/ A&P                           Medical Decision Making Amount and/or Complexity of Data Reviewed Labs: ordered. Radiology: ordered.  Risk Prescription drug management.   Anemia is stable compared to baseline.  No significant electrolyte disturbance.  Will be given IV fluids and IV pain medicine.  Lipase and LFTs added on from triage lab work.  Hip x-ray viewed/interpreted by myself and no fracture/dislocation.  DVT ultrasound is unremarkable.  CT will be obtained given the abdominal complaints.  Care transferred to Dr. Tyrone Nine.        Final Clinical Impression(s) / ED Diagnoses Final diagnoses:  None    Rx / DC Orders ED Discharge Orders     None         Sherwood Gambler, MD 08/22/22  1550

## 2022-08-22 NOTE — ED Provider Triage Note (Signed)
Emergency Medicine Provider Triage Evaluation Note  Kayla Baker , a 86 y.o. female  was evaluated in triage.  Pt complains of generalized weakness.  The patient states she was discharged in the hospital on September 9 after being admitted for pancreatitis.  She states she has felt weak since returning home.  The weakness has been consistent since that time.  She states that this morning she felt slightly more weak.  She also complains of right-sided hip pain and endorses having a partial hip replacement in December of this past year.  Patient also states that her head feels "strange".  Denies shortness of breath, chest pain, abdominal pain, nausea, vomiting, urinary symptoms  Review of Systems  Positive: Weakness, dizziness, hip pain Negative: As above  Physical Exam  BP (!) 147/67 (BP Location: Right Arm)   Pulse (!) 103   Temp 98.8 F (37.1 C) (Oral)   Resp (!) 24   SpO2 99%  Gen:   Awake, no distress   Resp:  Normal effort  MSK:   Moves extremities without difficulty  Other:    Medical Decision Making  Medically screening exam initiated at 11:20 AM.  Appropriate orders placed.  Kayla Baker was informed that the remainder of the evaluation will be completed by another provider, this initial triage assessment does not replace that evaluation, and the importance of remaining in the ED until their evaluation is complete.     Dorothyann Peng, PA-C 08/22/22 1144

## 2022-08-22 NOTE — ED Notes (Signed)
PT OFF FLOOR, WILL GET VITALS ON RETURN

## 2022-08-22 NOTE — ED Triage Notes (Addendum)
Pt c/o weakness x1 month. Pt states she has never recovered since being discharged 9/9. Pt states she missed her chemo treatment 9/19 due to weakness. Pt states last chemo treatment 8/29. Pt denies N/V/D. Pt denies SOB, denies chest pain. Pt c/o right hip pain, hx of right hip fx 10/2021. Pt denies abdominal pain, pt denies pain- just feeling weak.

## 2022-08-24 ENCOUNTER — Encounter (HOSPITAL_COMMUNITY): Payer: Self-pay

## 2022-08-24 ENCOUNTER — Inpatient Hospital Stay (HOSPITAL_COMMUNITY)
Admission: EM | Admit: 2022-08-24 | Discharge: 2022-09-02 | DRG: 981 | Disposition: A | Discharge status: Home Health | Payer: PPO | Attending: Internal Medicine | Admitting: Internal Medicine

## 2022-08-24 ENCOUNTER — Emergency Department (HOSPITAL_COMMUNITY): Payer: PPO

## 2022-08-24 ENCOUNTER — Other Ambulatory Visit: Payer: Self-pay

## 2022-08-24 DIAGNOSIS — I739 Peripheral vascular disease, unspecified: Secondary | ICD-10-CM | POA: Diagnosis not present

## 2022-08-24 DIAGNOSIS — E43 Unspecified severe protein-calorie malnutrition: Secondary | ICD-10-CM | POA: Diagnosis not present

## 2022-08-24 DIAGNOSIS — Z8379 Family history of other diseases of the digestive system: Secondary | ICD-10-CM

## 2022-08-24 DIAGNOSIS — Z6824 Body mass index (BMI) 24.0-24.9, adult: Secondary | ICD-10-CM

## 2022-08-24 DIAGNOSIS — I10 Essential (primary) hypertension: Secondary | ICD-10-CM | POA: Diagnosis not present

## 2022-08-24 DIAGNOSIS — E785 Hyperlipidemia, unspecified: Secondary | ICD-10-CM | POA: Diagnosis present

## 2022-08-24 DIAGNOSIS — C786 Secondary malignant neoplasm of retroperitoneum and peritoneum: Secondary | ICD-10-CM | POA: Diagnosis present

## 2022-08-24 DIAGNOSIS — R109 Unspecified abdominal pain: Secondary | ICD-10-CM | POA: Diagnosis not present

## 2022-08-24 DIAGNOSIS — L02415 Cutaneous abscess of right lower limb: Secondary | ICD-10-CM | POA: Diagnosis present

## 2022-08-24 DIAGNOSIS — E1169 Type 2 diabetes mellitus with other specified complication: Secondary | ICD-10-CM | POA: Diagnosis present

## 2022-08-24 DIAGNOSIS — Y831 Surgical operation with implant of artificial internal device as the cause of abnormal reaction of the patient, or of later complication, without mention of misadventure at the time of the procedure: Secondary | ICD-10-CM | POA: Diagnosis present

## 2022-08-24 DIAGNOSIS — D649 Anemia, unspecified: Secondary | ICD-10-CM | POA: Diagnosis not present

## 2022-08-24 DIAGNOSIS — T8451XD Infection and inflammatory reaction due to internal right hip prosthesis, subsequent encounter: Secondary | ICD-10-CM | POA: Diagnosis not present

## 2022-08-24 DIAGNOSIS — E440 Moderate protein-calorie malnutrition: Secondary | ICD-10-CM | POA: Diagnosis present

## 2022-08-24 DIAGNOSIS — N1832 Chronic kidney disease, stage 3b: Secondary | ICD-10-CM | POA: Diagnosis present

## 2022-08-24 DIAGNOSIS — D696 Thrombocytopenia, unspecified: Secondary | ICD-10-CM | POA: Diagnosis not present

## 2022-08-24 DIAGNOSIS — M25551 Pain in right hip: Secondary | ICD-10-CM

## 2022-08-24 DIAGNOSIS — Z7984 Long term (current) use of oral hypoglycemic drugs: Secondary | ICD-10-CM

## 2022-08-24 DIAGNOSIS — Z79899 Other long term (current) drug therapy: Secondary | ICD-10-CM

## 2022-08-24 DIAGNOSIS — C569 Malignant neoplasm of unspecified ovary: Secondary | ICD-10-CM

## 2022-08-24 DIAGNOSIS — K7460 Unspecified cirrhosis of liver: Secondary | ICD-10-CM | POA: Diagnosis present

## 2022-08-24 DIAGNOSIS — M1990 Unspecified osteoarthritis, unspecified site: Secondary | ICD-10-CM | POA: Diagnosis present

## 2022-08-24 DIAGNOSIS — M79604 Pain in right leg: Secondary | ICD-10-CM | POA: Diagnosis not present

## 2022-08-24 DIAGNOSIS — Z803 Family history of malignant neoplasm of breast: Secondary | ICD-10-CM

## 2022-08-24 DIAGNOSIS — K766 Portal hypertension: Secondary | ICD-10-CM | POA: Diagnosis present

## 2022-08-24 DIAGNOSIS — Z8249 Family history of ischemic heart disease and other diseases of the circulatory system: Secondary | ICD-10-CM

## 2022-08-24 DIAGNOSIS — K859 Acute pancreatitis without necrosis or infection, unspecified: Secondary | ICD-10-CM | POA: Diagnosis present

## 2022-08-24 DIAGNOSIS — Z9841 Cataract extraction status, right eye: Secondary | ICD-10-CM

## 2022-08-24 DIAGNOSIS — N390 Urinary tract infection, site not specified: Secondary | ICD-10-CM | POA: Diagnosis present

## 2022-08-24 DIAGNOSIS — Z7982 Long term (current) use of aspirin: Secondary | ICD-10-CM

## 2022-08-24 DIAGNOSIS — M109 Gout, unspecified: Secondary | ICD-10-CM | POA: Diagnosis present

## 2022-08-24 DIAGNOSIS — E119 Type 2 diabetes mellitus without complications: Secondary | ICD-10-CM

## 2022-08-24 DIAGNOSIS — R809 Proteinuria, unspecified: Secondary | ICD-10-CM | POA: Diagnosis present

## 2022-08-24 DIAGNOSIS — Z833 Family history of diabetes mellitus: Secondary | ICD-10-CM

## 2022-08-24 DIAGNOSIS — E8809 Other disorders of plasma-protein metabolism, not elsewhere classified: Secondary | ICD-10-CM | POA: Diagnosis present

## 2022-08-24 DIAGNOSIS — E1122 Type 2 diabetes mellitus with diabetic chronic kidney disease: Secondary | ICD-10-CM | POA: Diagnosis present

## 2022-08-24 DIAGNOSIS — Z96649 Presence of unspecified artificial hip joint: Principal | ICD-10-CM

## 2022-08-24 DIAGNOSIS — N3000 Acute cystitis without hematuria: Secondary | ICD-10-CM | POA: Diagnosis present

## 2022-08-24 DIAGNOSIS — G8929 Other chronic pain: Secondary | ICD-10-CM | POA: Diagnosis present

## 2022-08-24 DIAGNOSIS — T8459XA Infection and inflammatory reaction due to other internal joint prosthesis, initial encounter: Secondary | ICD-10-CM

## 2022-08-24 DIAGNOSIS — M7989 Other specified soft tissue disorders: Secondary | ICD-10-CM | POA: Diagnosis present

## 2022-08-24 DIAGNOSIS — Z886 Allergy status to analgesic agent status: Secondary | ICD-10-CM

## 2022-08-24 DIAGNOSIS — B9620 Unspecified Escherichia coli [E. coli] as the cause of diseases classified elsewhere: Secondary | ICD-10-CM | POA: Diagnosis present

## 2022-08-24 DIAGNOSIS — R140 Abdominal distension (gaseous): Secondary | ICD-10-CM | POA: Diagnosis present

## 2022-08-24 DIAGNOSIS — T8459XD Infection and inflammatory reaction due to other internal joint prosthesis, subsequent encounter: Secondary | ICD-10-CM | POA: Diagnosis not present

## 2022-08-24 DIAGNOSIS — Z86010 Personal history of colonic polyps: Secondary | ICD-10-CM

## 2022-08-24 DIAGNOSIS — T8451XA Infection and inflammatory reaction due to internal right hip prosthesis, initial encounter: Principal | ICD-10-CM | POA: Diagnosis present

## 2022-08-24 DIAGNOSIS — Z801 Family history of malignant neoplasm of trachea, bronchus and lung: Secondary | ICD-10-CM

## 2022-08-24 DIAGNOSIS — D61818 Other pancytopenia: Secondary | ICD-10-CM | POA: Diagnosis not present

## 2022-08-24 DIAGNOSIS — Z820 Family history of epilepsy and other diseases of the nervous system: Secondary | ICD-10-CM

## 2022-08-24 DIAGNOSIS — Z881 Allergy status to other antibiotic agents status: Secondary | ICD-10-CM

## 2022-08-24 DIAGNOSIS — Z853 Personal history of malignant neoplasm of breast: Secondary | ICD-10-CM

## 2022-08-24 DIAGNOSIS — Z9012 Acquired absence of left breast and nipple: Secondary | ICD-10-CM

## 2022-08-24 DIAGNOSIS — L02419 Cutaneous abscess of limb, unspecified: Secondary | ICD-10-CM

## 2022-08-24 DIAGNOSIS — D630 Anemia in neoplastic disease: Secondary | ICD-10-CM | POA: Diagnosis present

## 2022-08-24 DIAGNOSIS — R824 Acetonuria: Secondary | ICD-10-CM | POA: Diagnosis present

## 2022-08-24 DIAGNOSIS — B957 Other staphylococcus as the cause of diseases classified elsewhere: Secondary | ICD-10-CM | POA: Diagnosis present

## 2022-08-24 DIAGNOSIS — Z8719 Personal history of other diseases of the digestive system: Secondary | ICD-10-CM

## 2022-08-24 DIAGNOSIS — Z923 Personal history of irradiation: Secondary | ICD-10-CM

## 2022-08-24 DIAGNOSIS — K5790 Diverticulosis of intestine, part unspecified, without perforation or abscess without bleeding: Secondary | ICD-10-CM | POA: Diagnosis present

## 2022-08-24 DIAGNOSIS — R823 Hemoglobinuria: Secondary | ICD-10-CM | POA: Diagnosis present

## 2022-08-24 DIAGNOSIS — Z8543 Personal history of malignant neoplasm of ovary: Secondary | ICD-10-CM

## 2022-08-24 DIAGNOSIS — R180 Malignant ascites: Secondary | ICD-10-CM | POA: Diagnosis present

## 2022-08-24 DIAGNOSIS — Z9221 Personal history of antineoplastic chemotherapy: Secondary | ICD-10-CM

## 2022-08-24 DIAGNOSIS — Z800 Family history of malignant neoplasm of digestive organs: Secondary | ICD-10-CM

## 2022-08-24 DIAGNOSIS — E876 Hypokalemia: Secondary | ICD-10-CM | POA: Diagnosis present

## 2022-08-24 DIAGNOSIS — Y792 Prosthetic and other implants, materials and accessory orthopedic devices associated with adverse incidents: Secondary | ICD-10-CM | POA: Diagnosis present

## 2022-08-24 DIAGNOSIS — Z96641 Presence of right artificial hip joint: Secondary | ICD-10-CM | POA: Diagnosis present

## 2022-08-24 DIAGNOSIS — I129 Hypertensive chronic kidney disease with stage 1 through stage 4 chronic kidney disease, or unspecified chronic kidney disease: Secondary | ICD-10-CM | POA: Diagnosis present

## 2022-08-24 DIAGNOSIS — Z90710 Acquired absence of both cervix and uterus: Secondary | ICD-10-CM

## 2022-08-24 DIAGNOSIS — D539 Nutritional anemia, unspecified: Secondary | ICD-10-CM | POA: Diagnosis present

## 2022-08-24 DIAGNOSIS — Z9181 History of falling: Secondary | ICD-10-CM

## 2022-08-24 DIAGNOSIS — M1000 Idiopathic gout, unspecified site: Secondary | ICD-10-CM | POA: Diagnosis not present

## 2022-08-24 DIAGNOSIS — Z823 Family history of stroke: Secondary | ICD-10-CM

## 2022-08-24 DIAGNOSIS — R161 Splenomegaly, not elsewhere classified: Secondary | ICD-10-CM | POA: Diagnosis present

## 2022-08-24 DIAGNOSIS — Z8781 Personal history of (healed) traumatic fracture: Secondary | ICD-10-CM

## 2022-08-24 DIAGNOSIS — K219 Gastro-esophageal reflux disease without esophagitis: Secondary | ICD-10-CM | POA: Diagnosis present

## 2022-08-24 DIAGNOSIS — T8453XA Infection and inflammatory reaction due to internal right knee prosthesis, initial encounter: Secondary | ICD-10-CM | POA: Diagnosis not present

## 2022-08-24 DIAGNOSIS — Z888 Allergy status to other drugs, medicaments and biological substances status: Secondary | ICD-10-CM

## 2022-08-24 DIAGNOSIS — J302 Other seasonal allergic rhinitis: Secondary | ICD-10-CM | POA: Diagnosis present

## 2022-08-24 DIAGNOSIS — Z9842 Cataract extraction status, left eye: Secondary | ICD-10-CM

## 2022-08-24 LAB — COMPREHENSIVE METABOLIC PANEL
ALT: 26 U/L (ref 0–44)
AST: 42 U/L — ABNORMAL HIGH (ref 15–41)
Albumin: 2.3 g/dL — ABNORMAL LOW (ref 3.5–5.0)
Alkaline Phosphatase: 97 U/L (ref 38–126)
Anion gap: 9 (ref 5–15)
BUN: 30 mg/dL — ABNORMAL HIGH (ref 8–23)
CO2: 23 mmol/L (ref 22–32)
Calcium: 8.9 mg/dL (ref 8.9–10.3)
Chloride: 105 mmol/L (ref 98–111)
Creatinine, Ser: 1.14 mg/dL — ABNORMAL HIGH (ref 0.44–1.00)
GFR, Estimated: 47 mL/min — ABNORMAL LOW (ref >60)
Glucose, Bld: 95 mg/dL (ref 70–99)
Potassium: 3.4 mmol/L — ABNORMAL LOW (ref 3.5–5.1)
Sodium: 137 mmol/L (ref 135–145)
Total Bilirubin: 1.3 mg/dL — ABNORMAL HIGH (ref 0.3–1.2)
Total Protein: 6.4 g/dL — ABNORMAL LOW (ref 6.5–8.1)

## 2022-08-24 LAB — URINALYSIS, ROUTINE W REFLEX MICROSCOPIC
Bilirubin Urine: NEGATIVE
Glucose, UA: NEGATIVE mg/dL
Ketones, ur: 5 mg/dL — AB
Nitrite: NEGATIVE
Protein, ur: 30 mg/dL — AB
Specific Gravity, Urine: 1.019 (ref 1.005–1.030)
WBC, UA: >50 WBC/hpf — ABNORMAL HIGH (ref 0–5)
pH: 5 (ref 5.0–8.0)

## 2022-08-24 LAB — CBC WITH DIFFERENTIAL/PLATELET
Abs Immature Granulocytes: 0.13 10*3/uL — ABNORMAL HIGH (ref 0.00–0.07)
Basophils Absolute: 0 10*3/uL (ref 0.0–0.1)
Basophils Relative: 0 %
Eosinophils Absolute: 0.2 10*3/uL (ref 0.0–0.5)
Eosinophils Relative: 2 %
HCT: 27.9 % — ABNORMAL LOW (ref 36.0–46.0)
Hemoglobin: 9.1 g/dL — ABNORMAL LOW (ref 12.0–15.0)
Immature Granulocytes: 2 %
Lymphocytes Relative: 21 %
Lymphs Abs: 1.3 10*3/uL (ref 0.7–4.0)
MCH: 32 pg (ref 26.0–34.0)
MCHC: 32.6 g/dL (ref 30.0–36.0)
MCV: 98.2 fL (ref 80.0–100.0)
Monocytes Absolute: 0.8 10*3/uL (ref 0.1–1.0)
Monocytes Relative: 12 %
Neutro Abs: 3.8 10*3/uL (ref 1.7–7.7)
Neutrophils Relative %: 63 %
Platelets: 145 10*3/uL — ABNORMAL LOW (ref 150–400)
RBC: 2.84 MIL/uL — ABNORMAL LOW (ref 3.87–5.11)
RDW: 18.9 % — ABNORMAL HIGH (ref 11.5–15.5)
WBC: 6.2 10*3/uL (ref 4.0–10.5)
nRBC: 0.3 % — ABNORMAL HIGH (ref 0.0–0.2)

## 2022-08-24 LAB — LIPASE, BLOOD: Lipase: 36 U/L (ref 11–51)

## 2022-08-24 LAB — C-REACTIVE PROTEIN: CRP: 9.5 mg/dL — ABNORMAL HIGH (ref <1.0)

## 2022-08-24 LAB — LACTIC ACID, PLASMA: Lactic Acid, Venous: 1.4 mmol/L (ref 0.5–1.9)

## 2022-08-24 LAB — SEDIMENTATION RATE: Sed Rate: 62 mm/hr — ABNORMAL HIGH (ref 0–22)

## 2022-08-24 LAB — GLUCOSE, CAPILLARY
Glucose-Capillary: 91 mg/dL (ref 70–99)
Glucose-Capillary: 121 mg/dL — ABNORMAL HIGH (ref 70–99)

## 2022-08-24 LAB — MAGNESIUM: Magnesium: 1.2 mg/dL — ABNORMAL LOW (ref 1.7–2.4)

## 2022-08-24 MED ORDER — HYDROMORPHONE HCL 1 MG/ML IJ SOLN
0.5000 mg | Freq: Once | INTRAMUSCULAR | Status: AC
  Administered 2022-08-24: 0.5 mg via INTRAVENOUS
  Filled 2022-08-24: qty 1

## 2022-08-24 MED ORDER — SODIUM CHLORIDE 0.9 % IV SOLN
3.0000 g | Freq: Four times a day (QID) | INTRAVENOUS | Status: DC
  Administered 2022-08-24 – 2022-08-27 (×11): 3 g via INTRAVENOUS
  Filled 2022-08-24 (×13): qty 8

## 2022-08-24 MED ORDER — MAGNESIUM SULFATE 2 GM/50ML IV SOLN
2.0000 g | Freq: Once | INTRAVENOUS | Status: AC
  Administered 2022-08-24: 2 g via INTRAVENOUS
  Filled 2022-08-24: qty 50

## 2022-08-24 MED ORDER — POTASSIUM CHLORIDE CRYS ER 20 MEQ PO TBCR
20.0000 meq | EXTENDED_RELEASE_TABLET | Freq: Once | ORAL | Status: AC
  Administered 2022-08-24: 20 meq via ORAL
  Filled 2022-08-24: qty 1

## 2022-08-24 MED ORDER — HYDROCHLOROTHIAZIDE 12.5 MG PO TABS: 12.5000 mg | ORAL_TABLET | Freq: Every day | ORAL | Status: DC

## 2022-08-24 MED ORDER — ACETAMINOPHEN 650 MG RE SUPP: 650.0000 mg | Freq: Four times a day (QID) | RECTAL | Status: DC | PRN

## 2022-08-24 MED ORDER — POTASSIUM CHLORIDE CRYS ER 10 MEQ PO TBCR: 10.0000 meq | EXTENDED_RELEASE_TABLET | Freq: Every day | ORAL | Status: DC

## 2022-08-24 MED ORDER — ACETAMINOPHEN 325 MG PO TABS
650.0000 mg | ORAL_TABLET | Freq: Four times a day (QID) | ORAL | Status: DC | PRN
  Administered 2022-08-24 – 2022-08-25 (×2): 650 mg via ORAL
  Filled 2022-08-24 (×2): qty 2

## 2022-08-24 MED ORDER — ROSUVASTATIN CALCIUM 5 MG PO TABS
5.0000 mg | ORAL_TABLET | ORAL | Status: DC
  Administered 2022-08-25 – 2022-09-01 (×4): 5 mg via ORAL
  Filled 2022-08-24 (×5): qty 1

## 2022-08-24 MED ORDER — ONDANSETRON HCL 4 MG/2ML IJ SOLN
4.0000 mg | Freq: Once | INTRAMUSCULAR | Status: AC
  Administered 2022-08-24: 4 mg via INTRAVENOUS
  Filled 2022-08-24: qty 2

## 2022-08-24 MED ORDER — EZETIMIBE 10 MG PO TABS
10.0000 mg | ORAL_TABLET | Freq: Every day | ORAL | Status: DC
  Administered 2022-08-25 – 2022-09-02 (×9): 10 mg via ORAL
  Filled 2022-08-24 (×9): qty 1

## 2022-08-24 MED ORDER — ONDANSETRON HCL 4 MG PO TABS: 4.0000 mg | ORAL_TABLET | Freq: Four times a day (QID) | ORAL | Status: DC | PRN

## 2022-08-24 MED ORDER — MAGNESIUM OXIDE -MG SUPPLEMENT 400 (240 MG) MG PO TABS
400.0000 mg | ORAL_TABLET | Freq: Every day | ORAL | Status: DC
  Administered 2022-08-25 – 2022-08-31 (×7): 400 mg via ORAL
  Filled 2022-08-24 (×7): qty 1

## 2022-08-24 MED ORDER — OXYCODONE HCL 5 MG PO TABS
5.0000 mg | ORAL_TABLET | Freq: Four times a day (QID) | ORAL | Status: DC | PRN
  Administered 2022-08-24 – 2022-08-25 (×2): 5 mg via ORAL
  Filled 2022-08-24 (×2): qty 1

## 2022-08-24 MED ORDER — HYDROCHLOROTHIAZIDE 12.5 MG PO TABS: 25.0000 mg | ORAL_TABLET | Freq: Every day | ORAL | Status: DC

## 2022-08-24 MED ORDER — SODIUM CHLORIDE 0.9 % IV SOLN
1.5000 g | Freq: Once | INTRAVENOUS | Status: AC
  Administered 2022-08-24: 1.5 g via INTRAVENOUS
  Filled 2022-08-24: qty 4

## 2022-08-24 MED ORDER — ALLOPURINOL 100 MG PO TABS
100.0000 mg | ORAL_TABLET | Freq: Every day | ORAL | Status: DC
  Administered 2022-08-25 – 2022-09-02 (×9): 100 mg via ORAL
  Filled 2022-08-24 (×9): qty 1

## 2022-08-24 MED ORDER — LACTATED RINGERS IV BOLUS
1000.0000 mL | Freq: Once | INTRAVENOUS | Status: AC
  Administered 2022-08-24: 1000 mL via INTRAVENOUS

## 2022-08-24 MED ORDER — POLYETHYLENE GLYCOL 3350 17 G PO PACK
17.0000 g | PACK | Freq: Two times a day (BID) | ORAL | Status: DC
  Administered 2022-08-25 – 2022-09-02 (×5): 17 g via ORAL
  Filled 2022-08-24 (×12): qty 1

## 2022-08-24 MED ORDER — GABAPENTIN 300 MG PO CAPS
300.0000 mg | ORAL_CAPSULE | Freq: Four times a day (QID) | ORAL | Status: DC
  Administered 2022-08-24 – 2022-08-27 (×10): 300 mg via ORAL
  Filled 2022-08-24 (×10): qty 1

## 2022-08-24 MED ORDER — IOHEXOL 300 MG/ML  SOLN
100.0000 mL | Freq: Once | INTRAMUSCULAR | Status: AC | PRN
  Administered 2022-08-24: 100 mL via INTRAVENOUS

## 2022-08-24 MED ORDER — METOPROLOL SUCCINATE ER 25 MG PO TB24
25.0000 mg | ORAL_TABLET | Freq: Every day | ORAL | Status: DC
  Administered 2022-08-25 – 2022-09-02 (×9): 25 mg via ORAL
  Filled 2022-08-24 (×9): qty 1

## 2022-08-24 MED ORDER — ONDANSETRON HCL 4 MG/2ML IJ SOLN: 4.0000 mg | Freq: Four times a day (QID) | INTRAMUSCULAR | Status: DC | PRN

## 2022-08-24 NOTE — ED Provider Triage Note (Signed)
Emergency Medicine Provider Triage Evaluation Note  Kayla Baker , a 86 y.o. female  was evaluated in triage.  Pt complains of generalized weakness, "feelings of being worn out", chronic right hip pain, diffuse abdominal pain.  Patient states that symptoms have been worsening since her prior emergency department visit 2 days ago.  She has a history of ovarian cancer which is being treated with chemotherapy.  Right hip pain is described as unchanged in nature.  No recent falls.  She states this pain has been present since her prior surgery of December of last year.  She states that abdominal pain has worsened slightly.  She notes associated distention.  Denies nausea, vomiting, urinary/vaginal symptoms, change in bowel habits.  Last bowel movement was this morning.  She also reports right lower extremity swelling of which was present last ED visit with negative DVT ultrasound.  New symptoms on right lower leg includes redness of the anterior part of which is new as of this morning.  Denies fever, chills, night sweats, chest pain, shortness of breath.  Review of Systems  Positive: See above Negative:   Physical Exam  BP 135/72 (BP Location: Right Arm)   Pulse (!) 116   Temp 98.5 F (36.9 C) (Oral)   Resp 16   SpO2 94%  Gen:   Awake, no distress   Resp:  Normal effort  MSK:   Moves extremities without difficulty  Other:  Patient tachycardic.  Lungs clear to auscultation.  Diffusely distended abdomen with diffuse abdominal tenderness.  Right lower extremity 2+ pitting edema with erythema noted on the anterior portion of tenderness to palpation of right hip  Medical Decision Making  Medically screening exam initiated at 10:22 AM.  Appropriate orders placed.  Kayla Baker was informed that the remainder of the evaluation will be completed by another provider, this initial triage assessment does not replace that evaluation, and the importance of remaining in the ED until their evaluation is  complete.     Kayla Baker, [place] 08/24/22 1026

## 2022-08-24 NOTE — Consult Note (Addendum)
ORTHOPAEDIC CONSULTATION  REQUESTING PHYSICIAN: Reubin Milan, MD  Chief Complaint: right hip pain  HPI: Kayla Baker is a 86 y.o. female with history of osteoarthritis, breast cancer in the left breast, ovarian cancer with history of chemotherapy and radiation therapy, CKD, anemia, type 2 diabetes, GERD, gout on allopurinol, hyperlipidemia, hypertension, vitamin D treatment deficiency who presented to the emergency department today due to abdominal pain.  Upon questioning patient also states she has about one week of new onset right hip pain. She has a history of a fall in December 2022 which resulted in a right hip fracture. Dr. Zachery Dakins performed a right hip hemiarthroplasty on 11/08/2021.  She has done very well postoperatively.  She walks with a walker now as she is afraid she will fall if she does not.  She has not had any recent falls or injury to her right hip.  She feels like the pain came out of nowhere.  It is mainly located at her right buttocks, at the proximal aspect of her incision, but she does have groin pain from time to time as well.  Denies numbness or tingling.  No drainage noted from her wounds.  She denies fevers or chills, denies numbness or tingling.   CT abdomen pelvis was performed on 08/22/2022 when she was last brought to the emergency department.  That CT showed a small amount of fluid adjacent to the right hip in the gluteal musculature with some peripheral enhancement measuring 6.1 x 1.8 cm.  This fluid collection was not present on CT scan performed in July.   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[facility]) 02/29/2016   skin- 2016- squamous- on right upper arm   Chronic kidney disease    Closed displaced fracture of right femoral neck ([facility]) 11/07/2021   Deficiency anemia 11/02/2020   Diabetes mellitus without  complication ([facility])    GERD (gastroesophageal reflux disease)    Gout    takes Allopurinol daily   History of chemotherapy    History of colon polyps    benign   History of shingles    Hx of radiation therapy    Hyperlipidemia    Hypertension    Ovarian ca ([facility]) dx'd 03/2020   Personal history of chemotherapy    2017   Personal history of radiation therapy    2017   Vitamin D deficiency    Past Surgical History:  Procedure Laterality Date   ABDOMINAL HYSTERECTOMY  1973   BREAST BIOPSY Left 02/26/2016   BREAST LUMPECTOMY Left 03/18/2016   CARDIAC CATHETERIZATION     patient denies this procedure   CATARACT EXTRACTION, BILATERAL  2014   right eye 2/14; left eye 3/3   COLONOSCOPY     HAND SURGERY Left 2012   HIP ARTHROPLASTY Right 11/08/2021   Procedure: ARTHROPLASTY BIPOLAR HIP (HEMIARTHROPLASTY) POSTERIOR;  Surgeon: Willaim Sheng, MD;  Location: [place];  Service: Orthopedics;  Laterality: Right;   INCONTINENCE SURGERY  2006   IR IMAGING GUIDED PORT INSERTION  04/24/2020   IR PARACENTESIS  05/08/2020   IR PARACENTESIS  05/16/2020   IR PARACENTESIS  05/25/2020   KNEE ARTHROSCOPY Right    MASTOPEXY Right 06/16/2017   Procedure: RIGHT BREAST MASTOPEXY;  Surgeon: Irene Limbo, MD;  Location: [place];  Service: Plastics;  Laterality:  Right;   PORT-A-CATH REMOVAL N/A 11/27/2016   Procedure: REMOVAL PORT-A-CATH;  Surgeon: Fanny Skates, MD;  Location: [facility];  Service: General;  Laterality: N/A;   PORTACATH PLACEMENT N/A 04/14/2016   Procedure: INSERTION PORT-A-CATH ;  Surgeon: Fanny Skates, MD;  Location: [place];  Service: General;  Laterality: N/A;   RADIOACTIVE SEED GUIDED PARTIAL MASTECTOMY WITH AXILLARY SENTINEL LYMPH NODE BIOPSY Left 03/18/2016   Procedure: RADIOACTIVE SEED GUIDED LEFT PARTIAL MASTECTOMY WITH AXILLARY SENTINEL LYMPH NODE BIOPSY AND BLUE DYE INJECTION;  Surgeon: Fanny Skates, MD;  Location: [place];  Service: General;  Laterality: Left;    REDUCTION MAMMAPLASTY Right 2018   Social History   Socioeconomic History   Marital status: Married    Spouse name: [name]   Number of children: 0   Years of education: Not on file   Highest education level: Not on file  Occupational History   Not on file  Tobacco Use   Smoking status: Never   Smokeless tobacco: Never  Vaping Use   Vaping Use: Never used  Substance and Sexual Activity   Alcohol use: No   Drug use: No   Sexual activity: Not Currently    Birth control/protection: Surgical, Post-menopausal  Other Topics Concern   Not on file  Social History Narrative   Not on file   Social Determinants of Health   Financial Resource Strain: Not on file  Food Insecurity: Not on file  Transportation Needs: No Transportation Needs (09/26/2021)   PRAPARE - Transportation    Lack of Transportation (Medical): No    Lack of Transportation (Non-Medical): No  Physical Activity: Not on file  Stress: Not on file  Social Connections: Not on file   Family History  Problem Relation Age of Onset   Stroke Mother 83   Other Mother        history of hysterectomy after last childbirth   Diabetes Father    Alzheimer's disease Father    Hypertension Sister    Lung cancer Sister 82       smoker   Other Sister        history of hysterectomy for fibroids   Breast cancer Sister 2   Cirrhosis Sister    Stroke Maternal Grandmother    Heart Problems Maternal Grandmother    Esophageal cancer Maternal Aunt 74       not a smoker   Parkinson's disease Maternal Aunt    Heart attack Maternal Uncle 65   Pancreatic cancer Cousin 98       paternal 1st cousin   Lung cancer Other        nephew dx. 79s; +smoker   Epilepsy Other    Other Other 12       great niece dx. benign ganglioglioma brain tumor; treated at [facility]   Epilepsy Other        no seizures in 2 years   Colon cancer Neg Hx    Stomach cancer Neg Hx    Colon polyps Neg Hx    Ulcerative colitis Neg Hx    Allergies  Allergen  Reactions   Keflex [Cephalexin] Swelling    Tongue and throat swells Tolerates Amp/Amox, Piperacillin   Mobic [Meloxicam] Swelling   Prednisone Swelling and Other (See Comments)    Can Not take High doses   Premarin [Estrogens Conjugated] Hives   Trovan [Alatrofloxacin] Other (See Comments)    Unknown reaction     Positive ROS: All other systems have been reviewed and were otherwise  negative with the exception of those mentioned in the HPI and as above.  Physical Exam: General: Alert, no acute distress, laying in bed. Cardiovascular: No pre-tibial edema.  Respiratory: No cyanosis, no use of accessory musculature GI: Distended abdomen, tender to touch. Skin: No lesions in the area of chief complaint. Surgical incision without drainage, erythema, or ecchymosis.  Neurologic: Sensation intact distally Psychiatric: Patient is competent for consent with normal mood and affect Lymphatic: No axillary or cervical lymphadenopathy  MUSCULOSKELETAL: Surgical incision has healed well.  No signs of drainage or opening.  No edema erythema or ecchymosis.  She points to the proximal aspect of her incision as to where the majority of her pain is.  Also her groin.  No fluctuance palpated. Unable to perform a straight straight leg raise.  Does tolerate passive internal and external rotation.  Passive internal rotation causes mild to moderate groin pain.  Passive external rotation does not cause pain.  Dorsiflexion plantarflexion intact at the ankle.  Mild edema at the right foot.  Foot warm and well-perfused.  Sensation intact to all aspects of the foot.  Imaging:  CT abdomen pelvis was performed on 08/22/2022 when she was last brought to the emergency department.  That CT showed a small amount of fluid adjacent to the right hip in the gluteal musculature with some peripheral enhancement measuring 6.1 x 1.8 cm.  This fluid collection was not present on CT scan performed in July. CT abdomen pelvis was  performed today in the setting of new abdominal pain. I discussed these results with Dr. Kris Hartmann, please see addendum noting that there is a persistent fluid collection with thin enhancing rim is seen in the right gluteal muscles, superficial to the greater trochanter of the right hip. This measures 6.2 x 2.2 cm and shows no significant change compared to recent study 2 days ago.  WBC 6.2, Sed Rate 62, CRP in process  Assessment/Plan: Right hip pain in patient with previous right hip hemiarthroplasty for fracture with ovarian cancer currently under chemotherapy and right gluteal fluid collection seen on CT  - No outward signs of infection of examination of surgical incision, but she does have new pain with movement and weight bearing that just started in the last week - WBC 6.2, Sed Rate 62, CRP in process. Currently afebrile. - Discussed with Dr. Geroge Baseman, on call for IR, who did feel like this is a drainable fluid collection under ultrasound. Tentative plan for IR guided aspiration of the fluid collection tomorrow. Discussed with patient and family in room who agree with the plan.  - ok for unrestricted motion for now, [place], Hershal Coria    08/24/2022 2:49 PM

## 2022-08-24 NOTE — ED Provider Notes (Signed)
[name] DEPT Provider Note   CSN: [id number] Arrival date & time: 08/24/22  1005     History  Chief Complaint  Patient presents with   Abdominal Pain    Kayla Baker is a 86 y.o. female.  With PMH of ovarian cancer currently on chemotherapy who presents with acute on chronic worsening abdominal pain.  She was last seen here on 08/22/2022.  At the time she had a CT scan which showed evidence of worsening malignant ascites, peritoneal carcinomatosis right gluteal hip fluid collection, splenomegaly as well as evidence of cystitis.  She was last seen here in this Friday for worsening diffuse abdominal pain and distention over the past week.  She says this is similar to previous times when she used to have fluid taken off of her abdomen related to her cancer.  She does not think the fluid in her abdomen was ever infected.  She has diffuse tenderness and no 1 area localizing to it.  She has had decreased p.o. intake due to the pain and increasing fatigue and weakness and inability to get out of bed due to decreased energy.  She has no focal weakness numbness or tingling.  She is also complaining of pain over for the past week in her right hip and gluteus region.  She did not have any fall or recent injuries.  She was concerned because they told her there was a fluid collection there.  She denies any urinary symptoms.  She does endorse chronic swelling of her right leg but she said she had a recent ultrasound which showed no clot.  She is currently taking amoxicillin for infection but she is not sure for which one.   Abdominal Pain      Home Medications Prior to Admission medications   Medication Sig Start Date End Date Taking? Authorizing Provider  acetaminophen (TYLENOL) 325 MG tablet Take 325 mg by mouth every 6 (six) hours as needed for headache.    [provider]  allopurinol (ZYLOPRIM) 100 MG tablet Take 1 tablet (100 mg total) by mouth daily. FOR  GOUT PREVENTION, Patient taking differently: Take 100 mg by mouth daily. 03/25/22   Liane Comber, NP  amoxicillin (AMOXIL) 500 MG tablet Take 2,000 mg by mouth See admin instructions. Take prior to dental cleanings 03/03/22   [provider]  Cholecalciferol (VITAMIN D3) 5000 units CAPS Take 5,000 Units by mouth daily.    [provider]  cyanocobalamin 1000 MCG tablet Take 1,000 mcg by mouth daily.    [provider]  dexamethasone (DECADRON) 4 MG tablet Take 2 tablets the morning before chemotherapy and then 2 tablets daily for 2 days after chemo Patient not taking: Reported on 08/20/2022 07/01/22   Heath Lark, MD  ezetimibe (ZETIA) 10 MG tablet TAKE 1 TABLET BY MOUTH DAILY FOR CHOLESTEROL Patient taking differently: Take 10 mg by mouth daily. 04/24/22   Liane Comber, NP  gabapentin (NEURONTIN) 300 MG capsule Take 1 capsule 4 x  /day for Pain Patient taking differently: Take 300 mg by mouth 4 (four) times daily. 12/07/21   [name], MD  glimepiride (AMARYL) 1 MG tablet Take 1 tab by mouth in the morning if fasting is running 150+. Please do not take this medication if you are ill or not eating as this can cause a low blood sugar. Patient taking differently: Take 1 mg by mouth See admin instructions. Take 1 tab by mouth daily as needed if fasting is running 150+.  Please do not take this medication if you are ill or not eating as this can cause a low blood sugar. 05/04/20   Liane Comber, NP  hydrochlorothiazide (HYDRODIURIL) 25 MG tablet Take 1 tablet (25 mg total) by mouth daily. 08/20/22   Alycia Rossetti, NP  lidocaine-prilocaine (EMLA) cream Apply 1 application. topically daily as needed (for port access). 03/07/22   Heath Lark, MD  magnesium oxide (MAG-OX) 400 (240 Mg) MG tablet Take 1 tablet (400 mg total) by mouth daily. 08/19/22   Heath Lark, MD  metoprolol succinate (TOPROL-XL) 25 MG 24 hr tablet Take  1 tablet  Daily  for BP       /TAKE 1 TABLET BY MOUTH  DAILY FOR BLOOD PRESSURE Patient taking differently: Take 25 mg by mouth daily. 08/25/21   [name], MD  ondansetron (ZOFRAN) 8 MG tablet Take 1 tablet (8 mg total) by mouth every 8 (eight) hours as needed for refractory nausea / vomiting. 06/13/22   Heath Lark, MD  polyethylene glycol (MIRALAX) 17 g packet Take 17 g by mouth 2 (two) times daily. 17 grams in 6 oz of favorite drink twice a day until bowel movement.  LAXITIVE.  Restart if two days since last bowel movement Patient taking differently: Take 17 g by mouth 2 (two) times daily. 11/10/21   Shepperson, Kirstin, PA-C  rosuvastatin (CRESTOR) 5 MG tablet TAKE 1 TABLET BY MOUTH IN THE EVENING THREE TIMES WEEKLY FOR CHOLESTEROL Patient taking differently: Take 5 mg by mouth See admin instructions. Monday, Wednesday and Friday 03/07/22   Liane Comber, NP  trolamine salicylate (ASPERCREME) 10 % cream Apply 1 application topically as needed for muscle pain (Use if you still feel tingles in feet in the morning after taking Gabapentin).    [provider]  vitamin C (ASCORBIC ACID) 500 MG tablet Take 500 mg by mouth every evening.     [provider]      Allergies    Keflex [cephalexin], Mobic [meloxicam], Prednisone, Premarin [estrogens conjugated], and Trovan [alatrofloxacin]    Review of Systems   Review of Systems  Gastrointestinal:  Positive for abdominal pain.    Physical Exam Updated Vital Signs BP 139/71   Pulse 98   Temp 98.2 F (36.8 C) (Oral)   Resp 18 Comment: Simultaneous filing. User may not have seen previous data.  SpO2 93%  Physical Exam Constitutional: Alert and oriented.  Fatigued and chronically ill in appearance but no acute distress Eyes: Conjunctivae are normal. ENT      Head: Normocephalic and atraumatic.      Nose: No congestion.      Mouth/Throat: Mucous membranes are moist.      Neck: No stridor. Cardiovascular: S1, S2, tachycardic, regular rhythm Respiratory: Normal respiratory  effort. Breath sounds are normal.  O2 sat 93-95 on RA Gastrointestinal: Soft and distended with diffuse nonlocalizing tenderness, no rebound or guarding Musculoskeletal: Normal range of motion in Baker extremities.      Right lower leg: Tenderness over the right gluteus and hip and pain with passive range of motion, no external erythema, warmth or skin changes.  No deformity.  Sensation intact distally with palpable DP pulse.  Pitting edema extending from the foot to the knee 2+      Left lower leg: No tenderness or edema. Neurologic: Normal speech and language. No gross focal neurologic deficits are appreciated. Skin: Skin is warm, dry and intact. No rash noted. Psychiatric: Mood and affect are normal. Speech and behavior  are normal.  ED Results / Procedures / Treatments   Labs (Baker labs ordered are listed, but only abnormal results are displayed) Labs Reviewed  COMPREHENSIVE METABOLIC PANEL - Abnormal; Notable for the following components:      Result Value   Potassium 3.4 (*)    BUN 30 (*)    Creatinine, Ser 1.14 (*)    Total Protein 6.4 (*)    Albumin 2.3 (*)    AST 42 (*)    Total Bilirubin 1.3 (*)    GFR, Estimated 47 (*)    Baker other components within normal limits  CBC WITH DIFFERENTIAL/PLATELET - Abnormal; Notable for the following components:   RBC 2.84 (*)    Hemoglobin 9.1 (*)    HCT 27.9 (*)    RDW 18.9 (*)    Platelets 145 (*)    nRBC 0.3 (*)    Abs Immature Granulocytes 0.13 (*)    Baker other components within normal limits  URINALYSIS, ROUTINE W REFLEX MICROSCOPIC - Abnormal; Notable for the following components:   APPearance CLOUDY (*)    Hgb urine dipstick MODERATE (*)    Ketones, ur 5 (*)    Protein, ur 30 (*)    Leukocytes,Ua MODERATE (*)    WBC, UA >50 (*)    Bacteria, UA RARE (*)    Non Squamous Epithelial 0-5 (*)    Baker other components within normal limits  SEDIMENTATION RATE - Abnormal; Notable for the following components:   Sed Rate 62 (*)    Baker  other components within normal limits  LIPASE, BLOOD  LACTIC ACID, PLASMA  MAGNESIUM  C-REACTIVE PROTEIN    EKG None  Radiology CT ABDOMEN PELVIS W CONTRAST  Addendum Date: 08/24/2022   ADDENDUM REPORT: 08/24/2022 15:12 ADDENDUM: In addition, a persistent fluid collection with thin enhancing rim is seen in the right gluteal muscles, superficial to the greater trochanter of the right hip. This measures 6.2 x 2.2 cm and shows no significant change compared to recent study 2 days ago. Electronically Signed   By: Marlaine Hind M.D.   On: 08/24/2022 15:12   Result Date: 08/24/2022 CLINICAL DATA:  Worsening diffuse abdominal pain. Ovarian carcinoma. * Tracking Code: BO * EXAM: CT ABDOMEN AND PELVIS WITH CONTRAST TECHNIQUE: Multidetector CT imaging of the abdomen and pelvis was performed using the standard protocol following bolus administration of intravenous contrast. RADIATION DOSE REDUCTION: This exam was performed according to the departmental dose-optimization program which includes automated exposure control, adjustment of the mA and/or kV according to patient size and/or use of iterative reconstruction technique. CONTRAST:  182m OMNIPAQUE IOHEXOL 300 MG/ML  SOLN COMPARISON:  08/22/2022 FINDINGS: Lower Chest: No acute findings. Stable tiny bilateral pleural effusions. Hepatobiliary: Hepatic cirrhosis again demonstrated. No hepatic masses identified. Recanalization of paraumbilical veins again seen, consistent with portal venous hypertension. Gallbladder is unremarkable. No evidence of biliary ductal dilatation. Pancreas:  No mass or inflammatory changes. Spleen: Stable splenomegaly, consistent with portal venous hypertension. Adrenals/Urinary Tract: No suspicious masses identified. No evidence of ureteral calculi or hydronephrosis. Stomach/Bowel: Stable mild diffuse gastric wall thickening, most likely due to portal gastropathy in setting of cirrhosis. No evidence of obstruction, acute inflammatory  process, or abscess. Diverticulosis is seen mainly involving the sigmoid colon, however there is no evidence of diverticulitis. Vascular/Lymphatic: Stable sub-cm retroperitoneal lymph nodes in left para-aortic region. No pathologically enlarged lymph nodes. No acute vascular findings. Aortic atherosclerotic calcification incidentally noted. Reproductive: Prior hysterectomy. Peritoneal thickening and nodularity is again seen in  the pelvis. Focal site in right adnexal region measures 4.2 x 2.5 cm, without significant change since prior study. Soft tissue stranding throughout the omental fat remains stable, with a few sub-centimeter peritoneal soft tissue densities in the left anterior abdomen. Moderate ascites, without significant change. Other:  None. Musculoskeletal:  No suspicious bone lesions identified. 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 Hepatic cirrhosis and findings of portal venous hypertension. No evidence of hepatic neoplasm. Colonic diverticulosis, without radiographic evidence of diverticulitis. Aortic Atherosclerosis (ICD10-I70.0). Electronically Signed: By: Marlaine Hind M.D. On: 08/24/2022 13:07   CT ABDOMEN PELVIS W CONTRAST  Result Date: 08/22/2022 CLINICAL DATA:  A female at age 82 with history of ovarian cancer presents for evaluation of abdominal pain. * Tracking Code: BO * EXAM: CT ABDOMEN AND PELVIS WITH CONTRAST TECHNIQUE: Multidetector CT imaging of the abdomen and pelvis was performed using the standard protocol following bolus administration of intravenous contrast. RADIATION DOSE REDUCTION: This exam was performed according to the departmental dose-optimization program which includes automated exposure control, adjustment of the mA and/or kV according to patient size and/or use of iterative reconstruction technique. CONTRAST:  164m OMNIPAQUE IOHEXOL 300 MG/ML  SOLN COMPARISON:  August 05, 2022. FINDINGS: Lower chest:  Basilar atelectasis. No effusion. No consolidative changes. Central venous access device terminates in the upper RIGHT atrium. No substantial pericardial effusion or thickening. Hepatobiliary: Perihepatic ascites increased since June 11, 2022. Low-density area adjacent to the supra hepatic inferior vena cava which shows a "narrowed" appearance but appears patent, similar appearance to pre treatment images from May of 2021 where there was a similar volume of ascites and narrowed appearance. Perhaps this is related to mild mass effect or physiologic changes, this is not clear. As compared to noncontrast imaging from August 05, 2022 this may be similar but it represents a change compared to June 11, 2022 and the inferior vena cava appears more [facility] level. No pericholecystic stranding. No gallbladder distension or biliary duct dilation. Fissural widening of hepatic fissures. Pancreas: Generalized edema throughout the abdomen without substantial asymmetric edema about the pancreas or peripancreatic fluid. No ductal dilation. Preservation of normal pancreatic parenchymal architecture. Spleen: Perisplenic ascites. No parenchymal lesion. Mild splenomegaly. Adrenals/Urinary Tract: Adrenal glands are normal. Symmetric renal enhancement without hydronephrosis. Marked irregularity of the low volume urinary bladder that displays extensive mucosal enhancement. Limited assessment due to streak artifact from RIGHT hip arthroplasty. No suspicious renal lesion. No ureteral distension. Stomach/Bowel: Enhancement in the pelvis of fascial planes and along the serosa of the sigmoid colon is similar in terms of distribution now associated with more ascites than on the study of June 11 2022. When compared to the more recent study of August 05, 2022 and implant in the RIGHT pelvis measuring 4.8 x 3.2 cm is unchanged though the volume of ascites again is moderate and new in the very short interval since the prior study.  Bowel floats in ascites. The appendix is not visible though there are no secondary signs to suggest acute appendiceal process. Stool fills much of the colon without signs of colonic obstruction and with serosal and pelvic disease as outlined above extending across the top of the adjacent urinary bladder in the area of the sigmoid. Vascular/Lymphatic: Small retroperitoneal lymph nodes are unchanged compared to recent imaging largest along the LEFT para-aortic chain. Patent abdominal vessels on venous phase. Calcified and noncalcified aortic atherosclerotic plaque. Reproductive: Post hysterectomy with soft tissue in the pelvis as discussed. Other: Increase  in ascites as discussed. No pneumoperitoneum. Stable appearance of peritoneal nodularity over the short interval, for example in addition to the pelvic soft tissue there are diffuse areas of omental and peritoneal nodularity also an implant along the RIGHT hemiabdomen measuring 2.5 x 1.3 cm. Musculoskeletal: Post RIGHT hip arthroplasty. Small amount of fluid adjacent to the RIGHT hip in the gluteal musculature with some peripheral enhancement (image 76/2) perhaps mild stranding in this area on the previous study with tract from prior postoperative change in the subcutaneous fat. This intramuscular collection was however not present in July. It currently measures 6.1 x 1.8 cm No destructive bone process or acute bone finding. IMPRESSION: 1. Signs of marked cystitis, not well evaluated. 2. Interval development of moderate volume ascites in the short interval, more similar to more remote images. Significance is uncertain but could be related to decompensation of underlying liver disease superimposed on signs of peritoneal carcinomatosis. Sampling of this fluid may be helpful. Would also correlate with hepatic function. 3. Narrowed appearance of the supra hepatic inferior vena cava just below the heart is of uncertain significance. Small volume ascites in this area  without soft tissue. The inferior vena cava appears more engorged below this level than on previous imaging. Given that this is just fluid density in the area mass effect is felt unlikely in the appearance is not substantially different from 2021. Close attention on follow-up is suggested. If this is a persistent abnormality or there is worsening without clear cause could consider pressure transduction above and below this area it to determine whether this is of physiologic significance. 4. Small amount of fluid adjacent to the RIGHT hip in the gluteal musculature with some peripheral enhancement. This was however not present in July. It currently measures 6.1 x 1.8 cm. This was not present in July and may represent hematoma from fall or infection/abscess. Correlate with any interval surgical revision of this hip since July. 5. Stable appearance of underlying oncologic findings of peritoneal carcinomatosis and retroperitoneal adenopathy. 6. Mild splenomegaly may be due to underlying liver disease and portal hypertension. Aortic Atherosclerosis (ICD10-I70.0). Electronically Signed   By: Zetta Bills M.D.   On: 08/22/2022 16:22    Procedures Procedures  Remain on constant cardiac monitoring, initial sinus tachycardia, improved to normal sinus rhythm.  Medications Ordered in ED Medications  acetaminophen (TYLENOL) tablet 650 mg (has no administration in time range)    Or  acetaminophen (TYLENOL) suppository 650 mg (has no administration in time range)  ondansetron (ZOFRAN) tablet 4 mg (has no administration in time range)    Or  ondansetron (ZOFRAN) injection 4 mg (has no administration in time range)  lactated ringers bolus 1,000 mL (0 mLs Intravenous Stopped 08/24/22 1350)  HYDROmorphone (DILAUDID) injection 0.5 mg (0.5 mg Intravenous Given 08/24/22 1143)  ondansetron (ZOFRAN) injection 4 mg (4 mg Intravenous Given 08/24/22 1143)  potassium chloride SA (KLOR-CON M) CR tablet 20 mEq (20 mEq Oral Given  08/24/22 1226)  iohexol (OMNIPAQUE) 300 MG/ML solution 100 mL (100 mLs Intravenous Contrast Given 08/24/22 1235)  ampicillin-sulbactam (UNASYN) 1.5 g in sodium chloride 0.9 % 100 mL IVPB (0 g Intravenous Stopped 08/24/22 1427)    ED Course/ Medical Decision Making/ A&P Clinical Course as of 08/24/22 1523  Sun Aug 24, 2022  1406 Spoke with Dr. Olevia Bowens admitting hospitalist who requests orthopedic consult regarding right gluteal hip fluid collection for recommendations.  Consulted orthopedics. [VB]  1522 Patient's case also discussed with orthopedic team who will be down to  evaluate the patient while she is inpatient and provide further recommendations regarding gluteus fluid collection. [VB]    Clinical Course User Index [VB] Elgie Congo, MD                           Medical Decision Making  Kayla Baker is a 86 y.o. female.  With PMH of ovarian cancer currently on chemotherapy who presents with acute on chronic worsening abdominal pain.  She was last seen here on 08/22/2022.  At the time she had a CT scan which showed evidence of worsening malignant ascites, peritoneal carcinomatosis right gluteal hip fluid collection, splenomegaly as well as evidence of cystitis.  Patient presents with worsening fatigue and generalized weakness.  No localizing weakness and no focal deficits, no concern for CVA based off chronicity of issues and no focal deficits.  Is likely related to underlying infection and worsening cancer based off previous scan.  Repeat CT was obtained today due to worsening pain and distention which showed no significant change in peritoneal carcinomatosis with moderate ascites.  UA shows persistent UTI with cloudy appearance moderate hemoglobin greater than 50 WBCs and moderate leukocyte esterase.  She has been taking amoxicillin at home without improvement.  I have ordered for IV Unasyn due to Keflex allergy.  She also has an indeterminate fluid collection of her right gluteus region  which could be infectious in nature.  Her right lower extremity is neurovascular intact with some pitting edema and recent ultrasound that showed no DVT.  Consider possible septic arthritis.  May require a VIR evaluation of both peritoneal fluid and right hip fluid.  Her labs show stable creatinine 1.14.  Elevated BUN likely due to decreased p.o. intake.  Mild hypokalemia 3.4.  Ordered for oral repletion.  Will page for admission for continued IV fluids, IV antibiotics for known UTI, further work-up of indeterminate right gluteal fluid collection and possible further evaluation of peritoneal fluid and continued pain control.  We will reach out to hospitalist for admission.    Amount and/or Complexity of Data Reviewed Labs: ordered. Radiology: ordered.  Risk Prescription drug management. Decision regarding hospitalization.    Final Clinical Impression(s) / ED Diagnoses Final diagnoses:  Abdominal pain, unspecified abdominal location  Peritoneal carcinomatosis [facility])  Urinary tract infection without hematuria, site unspecified  Hypokalemia    Rx / DC Orders ED Discharge Orders     None         Elgie Congo, MD 08/24/22 1523

## 2022-08-24 NOTE — H&P (Signed)
History and Physical    Patient: Kayla Baker OMB:[id number] DOB: [birth year] DOA: 08/24/2022 DOS: the patient was seen and examined on 08/24/2022 PCP: [name], MD  Patient coming from: Home  Chief Complaint:  Chief Complaint  Patient presents with   Abdominal Pain   HPI: Kayla Baker is a 86 y.o. female with medical history significant of seasonal allergies, unspecified anemia, osteoarthritis, breast cancer of the left breast, ovarian cancer, history of chemotherapy and radiation therapy, CKD, closed displaced fracture of the right femoral neck, deficiency anemia, type 2 diabetes, GERD, gout on allopurinol, history of benign colon polyps, history of herpes zoster, hyperlipidemia, hypertension, vitamin D deficiency, ovarian cancer related peritoneal carcinomatosis who is returning to the emergency department due to abdominal pain that feels like a pressure across her abdomen and increased ascites.  Positive occasional nausea and overall decreased appetite, but no diarrhea, constipation, melena or hematocheziaShe has also been having right hip pain for the past week with significant decrease in mobility.  She has had some dysuria that has been getting better and was being treated with amoxicillin for UTI.  No fever, chills or night sweats. No sore throat, rhinorrhea, dyspnea, wheezing or hemoptysis.  No chest pain, palpitations, diaphoresis, PND, orthopnea, but occasionally gets lower extremity edema, particularly on the right side.  .  No polyuria, polydipsia, polyphagia or blurred vision.  ED course: Initial vital signs were temperature 98.5 F, pulse 116, respiration 16, BP 135/72 mmHg O2 sat 94% on room air.  She received hydromorphone 0.5 mg IVP x1, K-Dur 20 mEq p.o. x1, 1000 mL normal saline bolus and 4 mg of ondansetron p.o.  Lab work: Her urinalysis showed moderate hemoglobinuria, ketonuria 5 and proteinuria 30 mg/dL.  There was moderate leukocyte esterase.  21-50 RBC, more than 50 WBC  with rare bacteria.  CBC showed a white count of 6.2 with 63% neutrophils, hemoglobin 9.1 g/dL and platelets 145.  Her set rate was 62 mm/h.  Lipase was normal.  CMP showed a potassium of 3.4 mmol/L, the rest of the electrolytes and glucose were normal.  BUN 30 and creatinine 1.14 mg/dL.  Total protein 6.4 and albumin 2.2 g/dL.  AST was 42 units/L.  Normal ALT and alkaline phosphatase.  Total bilirubin slightly elevated at 1.3 mg/dL.  Lactic acid is normal.  Imaging: CT abdomen/pelvis done 2 days ago showed signs of mild cystitis.  There was interval development of a moderate body myositis which might be related to the compensated liver disease superimposed on peritoneal carcinomatosis.  There was narrowed appearance of the suprahepatic inferior vena cava just below the heart that it is of uncertain significance.  If persistent or there is worsening without clear cause consider pressure transduction to determine physiologic significance.  There was small amount of fluid adjacent to the right hip in the gluteal musculature measuring 6.1 x 1.8 cm that could be a hematoma from fall or infection/abscess.  There was stable appearance of underlying oncologic findings of peritoneal carcinomatosis.  CT abdomen done today without any significant changes from previous 1, but no mention of gluteal muscle fluid collection.  CT head also done on 9/22 with no acute intracranial normalities.  Right hip x-ray from 2 days ago showing status post right hip arthroplasty but no evidence of hardware failure.   Review of Systems: As mentioned in the history of present illness. All other systems reviewed and are negative. Past Medical History:  Diagnosis Date   Allergy    Anemia  Arthritis    Blood transfusion without reported diagnosis    Breast cancer of upper-inner quadrant of left female breast ([place]) 02/29/2016   skin- 2016- squamous- on right upper arm   Chronic kidney disease    Closed displaced fracture of right  femoral neck (HCC) 11/07/2021   Deficiency anemia 11/02/2020   Diabetes mellitus without complication (HCC)    GERD (gastroesophageal reflux disease)    Gout    takes Allopurinol daily   History of chemotherapy    History of colon polyps    benign   History of shingles    Hx of radiation therapy    Hyperlipidemia    Hypertension    Ovarian ca ([place]) dx'd 03/2020   Personal history of chemotherapy    2017   Personal history of radiation therapy    2017   Vitamin D deficiency    Past Surgical History:  Procedure Laterality Date   ABDOMINAL HYSTERECTOMY  1973   BREAST BIOPSY Left 02/26/2016   BREAST LUMPECTOMY Left 03/18/2016   CARDIAC CATHETERIZATION     patient denies this procedure   CATARACT EXTRACTION, BILATERAL  2014   right eye 2/14; left eye 3/3   COLONOSCOPY     HAND SURGERY Left 2012   HIP ARTHROPLASTY Right 11/08/2021   Procedure: ARTHROPLASTY BIPOLAR HIP (HEMIARTHROPLASTY) POSTERIOR;  Surgeon: Willaim Sheng, MD;  Location: [facility];  Service: Orthopedics;  Laterality: Right;   INCONTINENCE SURGERY  2006   IR IMAGING GUIDED PORT INSERTION  04/24/2020   IR PARACENTESIS  05/08/2020   IR PARACENTESIS  05/16/2020   IR PARACENTESIS  05/25/2020   KNEE ARTHROSCOPY Right    MASTOPEXY Right 06/16/2017   Procedure: RIGHT BREAST MASTOPEXY;  Surgeon: Irene Limbo, MD;  Location: [facility];  Service: Plastics;  Laterality: Right;   PORT-A-CATH REMOVAL N/A 11/27/2016   Procedure: REMOVAL PORT-A-CATH;  Surgeon: Fanny Skates, MD;  Location: [facility];  Service: General;  Laterality: N/A;   PORTACATH PLACEMENT N/A 04/14/2016   Procedure: INSERTION PORT-A-CATH ;  Surgeon: Fanny Skates, MD;  Location: [facility];  Service: General;  Laterality: N/A;   RADIOACTIVE SEED GUIDED PARTIAL MASTECTOMY WITH AXILLARY SENTINEL LYMPH NODE BIOPSY Left 03/18/2016   Procedure: RADIOACTIVE SEED GUIDED LEFT PARTIAL MASTECTOMY WITH AXILLARY SENTINEL LYMPH NODE BIOPSY AND BLUE DYE INJECTION;   Surgeon: Fanny Skates, MD;  Location: [facility];  Service: General;  Laterality: Left;   REDUCTION MAMMAPLASTY Right 2018   Social History:  reports that she has never smoked. She has never used smokeless tobacco. She reports that she does not drink alcohol and does not use drugs.  Allergies  Allergen Reactions   Keflex [Cephalexin] Swelling    Tongue and throat swells Tolerates Amp/Amox, Piperacillin   Mobic [Meloxicam] Swelling   Prednisone Swelling and Other (See Comments)    Can Not take High doses   Premarin [Estrogens Conjugated] Hives   Trovan [Alatrofloxacin] Other (See Comments)    Unknown reaction    Family History  Problem Relation Age of Onset   Stroke Mother 75   Other Mother        history of hysterectomy after last childbirth   Diabetes Father    Alzheimer's disease Father    Hypertension Sister    Lung cancer Sister 74       smoker   Other Sister        history of hysterectomy for fibroids   Breast cancer Sister 67   Cirrhosis Sister  Stroke Maternal Grandmother    Heart Problems Maternal Grandmother    Esophageal cancer Maternal Aunt 74       not a smoker   Parkinson's disease Maternal Aunt    Heart attack Maternal Uncle 65   Pancreatic cancer Cousin 13       paternal 1st cousin   Lung cancer Other        nephew dx. 46s; +smoker   Epilepsy Other    Other Other 12       great niece dx. benign ganglioglioma brain tumor; treated at [facility]   Epilepsy Other        no seizures in 2 years   Colon cancer Neg Hx    Stomach cancer Neg Hx    Colon polyps Neg Hx    Ulcerative colitis Neg Hx     Prior to Admission medications   Medication Sig Start Date End Date Taking? Authorizing Provider  acetaminophen (TYLENOL) 325 MG tablet Take 325 mg by mouth every 6 (six) hours as needed for headache.    [provider]  allopurinol (ZYLOPRIM) 100 MG tablet Take 1 tablet (100 mg total) by mouth daily. FOR GOUT PREVENTION, Patient taking differently: Take 100  mg by mouth daily. 03/25/22   Liane Comber, NP  amoxicillin (AMOXIL) 500 MG tablet Take 2,000 mg by mouth See admin instructions. Take prior to dental cleanings 03/03/22   [provider]  Cholecalciferol (VITAMIN D3) 5000 units CAPS Take 5,000 Units by mouth daily.    [provider]  cyanocobalamin 1000 MCG tablet Take 1,000 mcg by mouth daily.    [provider]  dexamethasone (DECADRON) 4 MG tablet Take 2 tablets the morning before chemotherapy and then 2 tablets daily for 2 days after chemo Patient not taking: Reported on 08/20/2022 07/01/22   Heath Lark, MD  ezetimibe (ZETIA) 10 MG tablet TAKE 1 TABLET BY MOUTH DAILY FOR CHOLESTEROL Patient taking differently: Take 10 mg by mouth daily. 04/24/22   Liane Comber, NP  gabapentin (NEURONTIN) 300 MG capsule Take 1 capsule 4 x  /day for Pain Patient taking differently: Take 300 mg by mouth 4 (four) times daily. 12/07/21   [name], MD  glimepiride (AMARYL) 1 MG tablet Take 1 tab by mouth in the morning if fasting is running 150+. Please do not take this medication if you are ill or not eating as this can cause a low blood sugar. Patient taking differently: Take 1 mg by mouth See admin instructions. Take 1 tab by mouth daily as needed if fasting is running 150+. Please do not take this medication if you are ill or not eating as this can cause a low blood sugar. 05/04/20   Liane Comber, NP  hydrochlorothiazide (HYDRODIURIL) 25 MG tablet Take 1 tablet (25 mg total) by mouth daily. 08/20/22   Alycia Rossetti, NP  lidocaine-prilocaine (EMLA) cream Apply 1 application. topically daily as needed (for port access). 03/07/22   Heath Lark, MD  magnesium oxide (MAG-OX) 400 (240 Mg) MG tablet Take 1 tablet (400 mg total) by mouth daily. 08/19/22   Heath Lark, MD  metoprolol succinate (TOPROL-XL) 25 MG 24 hr tablet Take  1 tablet  Daily  for BP       /TAKE 1 TABLET BY MOUTH DAILY FOR BLOOD PRESSURE Patient taking differently:  Take 25 mg by mouth daily. 08/25/21   [name], MD  ondansetron (ZOFRAN) 8 MG tablet Take 1 tablet (8 mg total) by mouth every 8 (eight)  hours as needed for refractory nausea / vomiting. 06/13/22   Heath Lark, MD  polyethylene glycol (MIRALAX) 17 g packet Take 17 g by mouth 2 (two) times daily. 17 grams in 6 oz of favorite drink twice a day until bowel movement.  LAXITIVE.  Restart if two days since last bowel movement Patient taking differently: Take 17 g by mouth 2 (two) times daily. 11/10/21   Shepperson, Kirstin, PA-C  rosuvastatin (CRESTOR) 5 MG tablet TAKE 1 TABLET BY MOUTH IN THE EVENING THREE TIMES WEEKLY FOR CHOLESTEROL Patient taking differently: Take 5 mg by mouth See admin instructions. Monday, Wednesday and Friday 03/07/22   Liane Comber, NP  trolamine salicylate (ASPERCREME) 10 % cream Apply 1 application topically as needed for muscle pain (Use if you still feel tingles in feet in the morning after taking Gabapentin).    [provider]  vitamin C (ASCORBIC ACID) 500 MG tablet Take 500 mg by mouth every evening.     [provider]    Physical Exam: Vitals:   08/24/22 1310 08/24/22 1311 08/24/22 1351 08/24/22 1359  BP:   134/71   Pulse: 97 99 97   Resp:   18   Temp:    98.2 F (36.8 C)  TempSrc:    Oral  SpO2: 94% 93% 99%    Physical Exam Vitals and nursing note reviewed.  Constitutional:      Comments: Chronically ill-appearing.  HENT:     Head: Normocephalic.     Nose: No rhinorrhea.     Mouth/Throat:     Mouth: Mucous membranes are moist.  Eyes:     General: No scleral icterus.    Pupils: Pupils are equal, round, and reactive to light.  Neck:     Vascular: No JVD.  Cardiovascular:     Rate and Rhythm: Normal rate and regular rhythm.     Heart sounds: S1 normal and S2 normal.  Pulmonary:     Effort: Pulmonary effort is normal.     Breath sounds: Normal breath sounds.  Abdominal:     General: Bowel sounds are normal.     Palpations:  Abdomen is soft.     Tenderness: There is abdominal tenderness in the right upper quadrant, epigastric area and left upper quadrant. There is no right CVA tenderness, left CVA tenderness, guarding or rebound.  Musculoskeletal:     Cervical back: Neck supple.     Right lower leg: No edema.     Left lower leg: No edema.  Skin:    General: Skin is warm and dry.  Neurological:     General: No focal deficit present.     Mental Status: She is alert and oriented to person, place, and time.  Psychiatric:        Mood and Affect: Mood normal.    Data Reviewed:  There are no new results to review at this time. Assessment and Plan: Principal Problem:   Acute UTI (urinary tract infection) Due to pansensitive E. coli. Admit to telemetry/inpatient. We will continue Unasyn every 6 hours. Analgesics as needed.  Active Problems:   Right hip pain With imaging showing right gluteal fluid collection. No fever, chills, night sweats.   Likely hematoma, less likely abscess. Will need drainage for definite diagnosis. Orthopedic surgery and IR input appreciated. Analgesics as needed.    Hypomagnesemia Replacement ordered.    Stage 3b chronic kidney disease (CKD) (HCC) Monitor renal function electrolytes.    Hypokalemia Replacement ordered.    Essential  hypertension Continue metoprolol 25 mg p.o. daily. Decrease HCTZ to 12.5 mg daily given age and electrolyte abnormalities. Monitor BP, heart rate, renal function and electrolytes.    Hyperlipidemia associated with type 2 diabetes mellitus (HCC) Continue rosuvastatin 5 mg 3 times a week.    DM2 (diabetes mellitus, type 2) (HCC) Carbohydrate modified diet. Hold Amaryl. CBG monitoring before meals and bedtime.    Gout Continue allopurinol.    Ovarian cancer (HCC) Analgesics as needed. Follow-up with Dr. [name] as scheduled.      Hyperbilirubinemia Minimally elevated Follow-up bili Ruben level in the morning.    Normocytic  anemia Monitor hematocrit and hemoglobin.    Thrombocytopenia (HCC) Monitor platelet count.    Protein-calorie malnutrition, severe (HCC) Protein supplementation. Consider nutritional services evaluation.     Advance Care Planning:   Code Status: Prior   Consults: Orthopedic surgery.  Family Communication: Her spouse was at bedside.  Severity of Illness: The appropriate patient status for this patient is INPATIENT. Inpatient status is judged to be reasonable and necessary in order to provide the required intensity of service to ensure the patient's safety. The patient's presenting symptoms, physical exam findings, and initial radiographic and laboratory data in the context of their chronic comorbidities is felt to place them at high risk for further clinical deterioration. Furthermore, it is not anticipated that the patient will be medically stable for discharge from the hospital within 2 midnights of admission.   * I certify that at the point of admission it is my clinical judgment that the patient will require inpatient hospital care spanning beyond 2 midnights from the point of admission due to high intensity of service, high risk for further deterioration and high frequency of surveillance required.*  Author: Reubin Milan, MD 08/24/2022 2:09 PM  For on call review [email or web address].   This document was prepared using Dragon voice recognition software and may contain some unintended transcription errors.

## 2022-08-24 NOTE — ED Notes (Signed)
Patient placed on bedpan to provide urine sample.

## 2022-08-24 NOTE — ED Triage Notes (Signed)
Pt arrived via POV, c/o abd pain and bloating, wkn, no appetite. Hx of ovarian CA, currently being treated with chemo. Was seen recently for same, states is not feeling any better.

## 2022-08-25 ENCOUNTER — Inpatient Hospital Stay (HOSPITAL_COMMUNITY): Payer: PPO

## 2022-08-25 DIAGNOSIS — E876 Hypokalemia: Secondary | ICD-10-CM

## 2022-08-25 DIAGNOSIS — M79604 Pain in right leg: Secondary | ICD-10-CM

## 2022-08-25 DIAGNOSIS — M25551 Pain in right hip: Secondary | ICD-10-CM

## 2022-08-25 DIAGNOSIS — I739 Peripheral vascular disease, unspecified: Secondary | ICD-10-CM

## 2022-08-25 DIAGNOSIS — N390 Urinary tract infection, site not specified: Secondary | ICD-10-CM | POA: Diagnosis not present

## 2022-08-25 LAB — COMPREHENSIVE METABOLIC PANEL
ALT: 23 U/L (ref 0–44)
AST: 38 U/L (ref 15–41)
Albumin: 1.9 g/dL — ABNORMAL LOW (ref 3.5–5.0)
Alkaline Phosphatase: 85 U/L (ref 38–126)
Anion gap: 6 (ref 5–15)
BUN: 25 mg/dL — ABNORMAL HIGH (ref 8–23)
CO2: 24 mmol/L (ref 22–32)
Calcium: 8.4 mg/dL — ABNORMAL LOW (ref 8.9–10.3)
Chloride: 108 mmol/L (ref 98–111)
Creatinine, Ser: 1.2 mg/dL — ABNORMAL HIGH (ref 0.44–1.00)
GFR, Estimated: 45 mL/min — ABNORMAL LOW (ref >60)
Glucose, Bld: 136 mg/dL — ABNORMAL HIGH (ref 70–99)
Potassium: 3.5 mmol/L (ref 3.5–5.1)
Sodium: 138 mmol/L (ref 135–145)
Total Bilirubin: 0.8 mg/dL (ref 0.3–1.2)
Total Protein: 5.2 g/dL — ABNORMAL LOW (ref 6.5–8.1)

## 2022-08-25 LAB — CBC
HCT: 24.1 % — ABNORMAL LOW (ref 36.0–46.0)
Hemoglobin: 7.5 g/dL — ABNORMAL LOW (ref 12.0–15.0)
MCH: 31.5 pg (ref 26.0–34.0)
MCHC: 31.1 g/dL (ref 30.0–36.0)
MCV: 101.3 fL — ABNORMAL HIGH (ref 80.0–100.0)
Platelets: 104 10*3/uL — ABNORMAL LOW (ref 150–400)
RBC: 2.38 MIL/uL — ABNORMAL LOW (ref 3.87–5.11)
RDW: 19.3 % — ABNORMAL HIGH (ref 11.5–15.5)
WBC: 4.3 10*3/uL (ref 4.0–10.5)
nRBC: 0 % (ref 0.0–0.2)

## 2022-08-25 LAB — GLUCOSE, CAPILLARY
Glucose-Capillary: 105 mg/dL — ABNORMAL HIGH (ref 70–99)
Glucose-Capillary: 139 mg/dL — ABNORMAL HIGH (ref 70–99)
Glucose-Capillary: 130 mg/dL — ABNORMAL HIGH (ref 70–99)
Glucose-Capillary: 121 mg/dL — ABNORMAL HIGH (ref 70–99)

## 2022-08-25 LAB — MAGNESIUM: Magnesium: 1.8 mg/dL (ref 1.7–2.4)

## 2022-08-25 MED ORDER — SPIRONOLACTONE 25 MG PO TABS
50.0000 mg | ORAL_TABLET | Freq: Every day | ORAL | Status: DC
  Administered 2022-08-25 – 2022-08-27 (×3): 50 mg via ORAL
  Filled 2022-08-25 (×3): qty 2

## 2022-08-25 MED ORDER — LIDOCAINE HCL 1 % IJ SOLN
INTRAMUSCULAR | Status: AC
  Administered 2022-08-25: 10 mL
  Filled 2022-08-25: qty 20

## 2022-08-25 MED ORDER — POTASSIUM CHLORIDE CRYS ER 20 MEQ PO TBCR
40.0000 meq | EXTENDED_RELEASE_TABLET | Freq: Once | ORAL | Status: AC
  Administered 2022-08-25: 40 meq via ORAL
  Filled 2022-08-25: qty 2

## 2022-08-25 MED ORDER — FUROSEMIDE 20 MG PO TABS
20.0000 mg | ORAL_TABLET | Freq: Every day | ORAL | Status: DC
  Administered 2022-08-25 – 2022-08-27 (×3): 20 mg via ORAL
  Filled 2022-08-25 (×3): qty 1

## 2022-08-25 NOTE — Procedures (Signed)
Interventional Radiology Procedure Note  Procedure: 10 fr Drain placed in right hip fluid collection  Indication: Right hip fluid collection  Findings: Please refer to procedural dictation for full description.  Complications: None  EBL: < 10 mL  Miachel Roux, MD [phone]

## 2022-08-25 NOTE — Evaluation (Signed)
Physical Therapy One Time Evaluation Patient Details Name: ZAKEYA JUNKER MRN: [id number] DOB: June 19, 1936 Today's Date: 08/25/2022  History of Present Illness  86 year old married female with PMH of ovarian cancer with peritoneal carcinomatosis and fluid retention secondary to third spacing, anemia in neoplastic disease, CKD stage III, type II DM, GERD, gout, HLD, HTN, s/p right hip hemiarthroplasty 11/08/2021, presented to the ED on 08/22/2022 with complaints of abdominal distention, pain and pressure across her abdomen, nausea, decreased appetite and also with right hip pain with decreased mobility.  9/25: IR placed Drain in right hip fluid collection  Clinical Impression  Patient evaluated by Physical Therapy with no further acute PT needs identified. All education has been completed and the patient has no further questions. Pt assisted to bathroom and then ambulated good distance in hallway. Pt reports she has a RW at home.  Spouse present and assisting pt around the room.  Pt agreeable to continue mobilizing with mobility specialists. See below for any follow-up Physical Therapy or equipment needs. PT is signing off. Thank you for this referral.        Recommendations for follow up therapy are one component of a multi-disciplinary discharge planning process, led by the attending physician.  Recommendations may be updated based on patient status, additional functional criteria and insurance authorization.  Follow Up Recommendations No PT follow up      Assistance Recommended at Discharge PRN  Patient can return home with the following       Equipment Recommendations None recommended by PT  Recommendations for Other Services       Functional Status Assessment Patient has had a recent decline in their functional status and demonstrates the ability to make significant improvements in function in a reasonable and predictable amount of time.     Precautions / Restrictions  Precautions Precautions: Fall Restrictions Other Position/Activity Restrictions: WBAT and unrestricted motion per ortho note      Mobility  Bed Mobility Overal bed mobility: Needs Assistance Bed Mobility: Supine to Sit, Sit to Supine     Supine to sit: Min guard Sit to supine: Mod assist   General bed mobility comments: spouse assisted pt with LEs onto bed    Transfers Overall transfer level: Needs assistance Equipment used: Rolling walker (2 wheels) Transfers: Sit to/from Stand Sit to Stand: Supervision, Min guard           General transfer comment: pt performs safely    Ambulation/Gait Ambulation/Gait assistance: Min guard, Supervision Gait Distance (Feet): 300 Feet Assistive device: Rolling walker (2 wheels) Gait Pattern/deviations: Decreased stride length, Step-through pattern       General Gait Details: pt denies any pain, distance to tolerance, no unsteadiness or LOB observed  Stairs            Wheelchair Mobility    Modified Rankin (Stroke Patients Only)       Balance Overall balance assessment: No apparent balance deficits (not formally assessed) (denies falls)                                           Pertinent Vitals/Pain Pain Assessment Pain Assessment: No/denies pain    Home Living Family/patient expects to be discharged to:: Private residence Living Arrangements: Spouse/significant other Available Help at Discharge: Family Type of Home: House Home Access: Stairs to enter Entrance Stairs-Rails: Right Entrance Stairs-Number of Steps: 2   Home Layout:  One level Home Equipment: Conservation officer, nature (2 wheels)      Prior Function Prior Level of Function : Independent/Modified Independent                     Hand Dominance        Extremity/Trunk Assessment        Lower Extremity Assessment Lower Extremity Assessment: Generalized weakness       Communication   Communication: No difficulties   Cognition Arousal/Alertness: Awake/alert Behavior During Therapy: WFL for tasks assessed/performed Overall Cognitive Status: Within Functional Limits for tasks assessed                                          General Comments      Exercises     Assessment/Plan    PT Assessment Patient does not need any further PT services  PT Problem List         PT Treatment Interventions      PT Goals (Current goals can be found in the Care Plan section)  Acute Rehab PT Goals PT Goal Formulation: All assessment and education complete, DC therapy    Frequency       Co-evaluation               AM-PAC PT "6 Clicks" Mobility  Outcome Measure Help needed turning from your back to your side while in a flat bed without using bedrails?: None Help needed moving from lying on your back to sitting on the side of a flat bed without using bedrails?: A Little Help needed moving to and from a bed to a chair (including a wheelchair)?: A Little Help needed standing up from a chair using your arms (e.g., wheelchair or bedside chair)?: A Little Help needed to walk in hospital room?: A Little Help needed climbing 3-5 steps with a railing? : A Little 6 Click Score: 19    End of Session   Activity Tolerance: Patient tolerated treatment well Patient left: in bed;with call bell/phone within reach;with family/visitor present   PT Visit Diagnosis: Difficulty in walking, not elsewhere classified (R26.2)    Time: 1430-1450 PT Time Calculation (min) (ACUTE ONLY): 20 min   Charges:   PT Evaluation $PT Eval Low Complexity: 1 Low         Kati PT, DPT Physical Therapist Acute Rehabilitation Services Preferred contact method: Secure Chat Weekend Pager Only: [phone] Office: [phone]   Myrtis Hopping [place] 08/25/2022, 3:40 PM

## 2022-08-25 NOTE — Progress Notes (Signed)
ABI has been completed

## 2022-08-25 NOTE — Progress Notes (Signed)
PROGRESS NOTE   Kayla Baker  GEX:[id number]    DOB: 27-Nov-1936    DOA: 08/24/2022  PCP: [name], MD   I have briefly reviewed patients previous medical records in [facility].  Chief Complaint  Patient presents with   Abdominal Pain    Brief Narrative:  86 year old married female with PMH of ovarian cancer with peritoneal carcinomatosis and fluid retention secondary to third spacing, anemia in neoplastic disease, CKD stage III, type II DM, GERD, gout, HLD, HTN, s/p right hip hemiarthroplasty 11/08/2021, presented to the ED on 08/22/2022 with complaints of abdominal distention, pain and pressure across her abdomen, nausea, decreased appetite and also with right hip pain with decreased mobility.  Dysuria, being treated for UTI with amoxicillin.  Admitted for acute cystitis, right hip pain with imaging showing right gluteal fluid collection.  Started on IV antibiotics.  Orthopedics consulted and have requested IR for draining of the gluteal fluid.   Assessment & Plan:  Principal Problem:   Acute UTI (urinary tract infection) Active Problems:   Essential hypertension   Hyperlipidemia associated with type 2 diabetes mellitus (HCC)   DM2 (diabetes mellitus, type 2) (HCC)   Gout   Ovarian cancer ([place])   Hypomagnesemia   Stage 3b chronic kidney disease (CKD) (HCC)   Hypokalemia   Hyperbilirubinemia   Normocytic anemia   Thrombocytopenia (HCC)   Protein-calorie malnutrition, severe (HCC)   Right hip pain   Acute cystitis without hematuria: Urine culture 08/05/2022: Pansensitive E. coli.  Appropriately treated with amoxicillin as outpatient.  Now on Unasyn likely to cover for the gluteal fluid collection.  Right hip pain, s/p previous right hip hemiarthroplasty for fracture, right gluteal fluid collection seen on CT: Orthopedics consultation appreciated.  Orthopedics have consulted IR who plan imaging guided aspiration of the fluid 9/25.  WBAT.  Ovarian cancer, peritoneal  carcinomatosis with ascites: DC HCTZ.  Started low-dose Lasix and Aldactone.  Low-salt diet.  Fluid restriction.  Has had remote paracentesis but do not see need to have it done right away.  Follows with Dr. [name]  Hepatic cirrhosis with portal hypertension and ascites: Noted on CT abdomen.  Diuretic management as above.   Hypomagnesemia Replaced on admission.  We will add and check on this morning's labs.    Stage 3b chronic kidney disease (CKD) (HCC) Creatinine stable.     Hypokalemia Better but continue to replace and follow.     Essential hypertension Continue metoprolol 25 mg p.o. daily. Discontinue HCTZ.  Low-dose Lasix and Aldactone started      Hyperlipidemia associated with type 2 diabetes mellitus (HCC) Continue rosuvastatin 5 mg 3 times a week.     DM2 (diabetes mellitus, type 2) (HCC) Carbohydrate modified diet. Hold Amaryl. CBG monitoring before meals and bedtime. Reasonable inpatient control.     Gout Continue allopurinol.     Hyperbilirubinemia Resolved     Normocytic anemia/anemia in neoplastic disease Hemoglobin has dropped from 9.1-7.5 in the absence of overt bleeding.  Follow CBC daily and transfuse if hemoglobin 7 g or less.     Thrombocytopenia (HCC) Monitor platelet count.     Protein-calorie malnutrition, severe (HCC) Protein supplementation. Consulted RD.    There is no height or weight on file to calculate BMI.  Nutritional Status        Pressure Ulcer:     DVT prophylaxis: SCDs Start: 08/24/22 1440     Code Status: Full Code:  Family Communication: Discussed in detail with patient's spouse at bedside  Disposition:  Status is: Inpatient Remains inpatient appropriate because: IV antibiotics, need for aspiration of gluteal collection     Consultants:   Orthopedics Interventional radiology  Procedures:     Antimicrobials:   Unasyn   Subjective:  Patient reports that her abdominal distention and discomfort feel better.   Last BM 2 days prior.  No nausea or vomiting.  Admits to paracentesis sometime ago but not recently.  Also with right hip pain.  Objective:   Vitals:   08/24/22 1600 08/24/22 2055 08/25/22 0033 08/25/22 0358  BP: (!) 155/71 139/65 121/60 105/63  Pulse: 95 99 100 91  Resp:  18 17   Temp: 97.9 F (36.6 C) 98.3 F (36.8 C) 98.4 F (36.9 C) 98.2 F (36.8 C)  TempSrc: Oral Oral Oral Oral  SpO2: 98% 93% 91% 92%    General exam: Elderly female, moderately built, frail and chronically ill looking lying comfortably propped up in bed without distress. Respiratory system: Clear to auscultation. Respiratory effort normal. Cardiovascular system: S1 & S2 heard, RRR. No JVD, murmurs, rubs, gallops or clicks.  Trace right leg edema, chronic per patient report. Gastrointestinal system: Abdomen is mildly distended, soft and nontender.  Ascites++.  No organomegaly or masses appreciated.  Normal bowel sounds heard. Central nervous system: Alert and oriented. No focal neurological deficits. Extremities: Symmetric 5 x 5 power.  Right hip/greater trochanteric area tenderness but no external swelling appreciated. Skin: No rashes, lesions or ulcers Psychiatry: Judgement and insight appear normal. Mood & affect appropriate.     Data Reviewed:   I have personally reviewed following labs and imaging studies   CBC: Recent Labs  Lab 08/19/22 0926 08/22/22 1247 08/24/22 1029 08/25/22 0529  WBC 7.7 4.6 6.2 4.3  NEUTROABS 6.3  --  3.8  --   HGB 8.3* 8.1* 9.1* 7.5*  HCT 25.0* 25.5* 27.9* 24.1*  MCV 94.3 98.8 98.2 101.3*  PLT 152 126* 145* 104*    Basic Metabolic Panel: Recent Labs  Lab 08/19/22 0926 08/22/22 1247 08/24/22 1029 08/25/22 0529  NA 138 138 137 138  K 3.9 3.5 3.4* 3.5  CL 109 108 105 108  CO2 22 24 23 24   GLUCOSE 188* 124* 95 136*  BUN 25* 39* 30* 25*  CREATININE 1.23* 1.11* 1.14* 1.20*  CALCIUM 8.9 9.0 8.9 8.4*  MG  --   --  1.2*  --     Liver Function Tests: Recent Labs   Lab 08/19/22 0926 08/22/22 1539 08/24/22 1029 08/25/22 0529  AST 42* 57* 42* 38  ALT 23 34 26 23  ALKPHOS 132* 107 97 85  BILITOT 0.7 0.8 1.3* 0.8  PROT 6.3* 5.9* 6.4* 5.2*  ALBUMIN 2.6* 2.1* 2.3* 1.9*    CBG: Recent Labs  Lab 08/24/22 1648 08/24/22 2056 08/25/22 0751  GLUCAP 91 121* 105*    Microbiology Studies:  No results found for this or any previous visit (from the past 240 hour(s)).  Radiology Studies:  CT ABDOMEN PELVIS W CONTRAST  Addendum Date: 08/24/2022   ADDENDUM REPORT: 08/24/2022 15:12 ADDENDUM: In addition, a persistent fluid collection with thin enhancing rim is seen in the right gluteal muscles, superficial to the greater trochanter of the right hip. This measures 6.2 x 2.2 cm and shows no significant change compared to recent study 2 days ago. Electronically Signed   By: Marlaine Hind M.D.   On: 08/24/2022 15:12   Result Date: 08/24/2022 CLINICAL DATA:  Worsening diffuse abdominal pain. Ovarian carcinoma. * Tracking Code: BO *  EXAM: CT ABDOMEN AND PELVIS WITH CONTRAST TECHNIQUE: Multidetector CT imaging of the abdomen and pelvis was performed using the standard protocol following bolus administration of intravenous contrast. RADIATION DOSE REDUCTION: This exam was performed according to the departmental dose-optimization program which includes automated exposure control, adjustment of the mA and/or kV according to patient size and/or use of iterative reconstruction technique. CONTRAST:  149m OMNIPAQUE IOHEXOL 300 MG/ML  SOLN COMPARISON:  08/22/2022 FINDINGS: Lower Chest: No acute findings. Stable tiny bilateral pleural effusions. Hepatobiliary: Hepatic cirrhosis again demonstrated. No hepatic masses identified. Recanalization of paraumbilical veins again seen, consistent with portal venous hypertension. Gallbladder is unremarkable. No evidence of biliary ductal dilatation. Pancreas:  No mass or inflammatory changes. Spleen: Stable splenomegaly, consistent with  portal venous hypertension. Adrenals/Urinary Tract: No suspicious masses identified. No evidence of ureteral calculi or hydronephrosis. Stomach/Bowel: Stable mild diffuse gastric wall thickening, most likely due to portal gastropathy in setting of cirrhosis. No evidence of obstruction, acute inflammatory process, or abscess. Diverticulosis is seen mainly involving the sigmoid colon, however there is no evidence of diverticulitis. Vascular/Lymphatic: Stable sub-cm retroperitoneal lymph nodes in left para-aortic region. No pathologically enlarged lymph nodes. No acute vascular findings. Aortic atherosclerotic calcification incidentally noted. Reproductive: Prior hysterectomy. Peritoneal thickening and nodularity is again seen in the pelvis. Focal site in right adnexal region measures 4.2 x 2.5 cm, without significant change since prior study. Soft tissue stranding throughout the omental fat remains stable, with a few sub-centimeter peritoneal soft tissue densities in the left anterior abdomen. Moderate ascites, without significant change. Other:  None. Musculoskeletal:  No suspicious bone lesions identified. 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 Hepatic cirrhosis and findings of portal venous hypertension. No evidence of hepatic neoplasm. Colonic diverticulosis, without radiographic evidence of diverticulitis. Aortic Atherosclerosis (ICD10-I70.0). Electronically Signed: By: JMarlaine HindM.D. On: 08/24/2022 13:07    Scheduled Meds:    allopurinol  100 mg Oral Daily   ezetimibe  10 mg Oral Daily   gabapentin  300 mg Oral QID   hydrochlorothiazide  12.5 mg Oral Daily   magnesium oxide  400 mg Oral Daily   metoprolol succinate  25 mg Oral Daily   polyethylene glycol  17 g Oral BID   potassium chloride  10 mEq Oral Daily   rosuvastatin  5 mg Oral Q M,W,F    Continuous Infusions:    ampicillin-sulbactam (UNASYN) IV 3 g (08/25/22 0755)     LOS: 1  day     AVernell Leep MD,  FACP, FHM, SFHM, CAd[facility] [place]  To contact the attending provider between 7A-7P or the covering provider during after hours 7P-7A, please log into the web site [email or web address] and access using universal [place] password for that web site. If you do not have the password, please call the hospital operator.  08/25/2022, 9:11 AM

## 2022-08-25 NOTE — Progress Notes (Signed)
Mobility Specialist - Progress Note   08/25/22 1050  Mobility  Activity Ambulated with assistance in hallway  Level of Assistance Standby assist, set-up cues, supervision of patient - no hands on  Assistive Device Front wheel walker  Distance Ambulated (ft) 450 ft  Activity Response Tolerated well  $Mobility charge 1 Mobility   Pt received in chair and agreed for mobility, pain in R hip 5/10. Pt returned to chair with all needs met and family in room.   Roderick Pee Mobility Specialist

## 2022-08-25 NOTE — Progress Notes (Signed)
Subjective:  Hip pain slightly improved overnight, she was able to get up with mobility specialists. Continues to have abdominal pain and distention. Complains of continued pain over incision site and right groin. Patient has just had drain placed.     Objective:  PE: VITALS:   Vitals:   08/25/22 0033 08/25/22 0358 08/25/22 1136 08/25/22 1322  BP: 121/60 105/63 133/70 (!) 130/57  Pulse: 100 91 (!) 112 97  Resp: 17   18  Temp: 98.4 F (36.9 C) 98.2 F (36.8 C)  (!) 97.3 F (36.3 C)  TempSrc: Oral Oral  Oral  SpO2: 91% 92%  (!) 88%   MUSCULOSKELETAL: Surgical incision has healed well.  No signs of drainage or opening.  No edema erythema or ecchymosis.    No fluctuance palpated. Unable to perform a straight straight leg raise.  Does tolerate passive internal and external rotation.  Passive internal rotation causes no pain. Moderate pain with passive external rotation. Dorsiflexion plantarflexion intact at the ankle.  Mild edema at the right foot.  Foot warm and well-perfused.  Sensation intact to all aspects of the foot. Drain in placed with clean dressing. Approx 10 cc's of serous fluid in drain bulb  LABS  Results for orders placed or performed during the hospital encounter of 08/24/22 (from the past 24 hour(s))  Glucose, capillary     Status: None   Collection Time: 08/24/22  4:48 PM  Result Value Ref Range   Glucose-Capillary 91 70 - 99 mg/dL  Glucose, capillary     Status: Abnormal   Collection Time: 08/24/22  8:56 PM  Result Value Ref Range   Glucose-Capillary 121 (H) 70 - 99 mg/dL  Comprehensive metabolic panel     Status: Abnormal   Collection Time: 08/25/22  5:29 AM  Result Value Ref Range   Sodium 138 135 - 145 mmol/L   Potassium 3.5 3.5 - 5.1 mmol/L   Chloride 108 98 - 111 mmol/L   CO2 24 22 - 32 mmol/L   Glucose, Bld 136 (H) 70 - 99 mg/dL   BUN 25 (H) 8 - 23 mg/dL   Creatinine, Ser 1.20 (H) 0.44 - 1.00 mg/dL   Calcium 8.4 (L) 8.9 - 10.3 mg/dL   Total  Protein 5.2 (L) 6.5 - 8.1 g/dL   Albumin 1.9 (L) 3.5 - 5.0 g/dL   AST 38 15 - 41 U/L   ALT 23 0 - 44 U/L   Alkaline Phosphatase 85 38 - 126 U/L   Total Bilirubin 0.8 0.3 - 1.2 mg/dL   GFR, Estimated 45 (L) >60 mL/min   Anion gap 6 5 - 15  CBC     Status: Abnormal   Collection Time: 08/25/22  5:29 AM  Result Value Ref Range   WBC 4.3 4.0 - 10.5 K/uL   RBC 2.38 (L) 3.87 - 5.11 MIL/uL   Hemoglobin 7.5 (L) 12.0 - 15.0 g/dL   HCT 24.1 (L) 36.0 - 46.0 %   MCV 101.3 (H) 80.0 - 100.0 fL   MCH 31.5 26.0 - 34.0 pg   MCHC 31.1 30.0 - 36.0 g/dL   RDW 19.3 (H) 11.5 - 15.5 %   Platelets 104 (L) 150 - 400 K/uL   nRBC 0.0 0.0 - 0.2 %  Magnesium     Status: None   Collection Time: 08/25/22  6:44 AM  Result Value Ref Range   Magnesium 1.8 1.7 - 2.4 mg/dL  Glucose, capillary     Status: Abnormal  Collection Time: 08/25/22  7:51 AM  Result Value Ref Range   Glucose-Capillary 105 (H) 70 - 99 mg/dL  Glucose, capillary     Status: Abnormal   Collection Time: 08/25/22 11:23 AM  Result Value Ref Range   Glucose-Capillary 139 (H) 70 - 99 mg/dL    [place] DRAIN/INJ MAJOR JOINT/BURSA  Result Date: 08/25/2022 INDICATION: 86 year old woman with history of ovarian malignancy and prior right hip replacement presents to IR for drainage of fluid collection overlying the right hip. EXAM: Ultrasound-guided drain placement in fluid collection overlying right hip. MEDICATIONS: The patient is currently admitted to the hospital and receiving intravenous antibiotics. The antibiotics were administered within an appropriate time frame prior to the initiation of the procedure. ANESTHESIA/SEDATION: None COMPLICATIONS: None immediate. PROCEDURE: Informed written consent was obtained from the patient after a thorough discussion of the procedural risks, benefits and alternatives. All questions were addressed. Maximal Sterile Barrier Technique was utilized including caps, mask, sterile gowns, sterile gloves, sterile drape, hand  hygiene and skin antiseptic. A timeout was performed prior to the initiation of the procedure. Patient positioned left lateral decubitus on the procedure table. The skin overlying the right hip was prepped and draped in usual fashion. Ultrasound evaluation of the right hip again showed the fluid collection between the gluteal musculature. Following local lidocaine administration, the right hip fluid collection was accessed with 19 gauge Yueh needle utilizing continuous ultrasound guidance. Yellow cloudy fluid was aspirated and sent for Gram stain and culture, as well as, cytology. The Yueh catheter was exchanged for 10.2 [place] multipurpose pigtail catheter over 0.035 inch guidewire. Drain was secured to skin with suture and connected to bulb suction. IMPRESSION: Ultrasound-guided drain placement in fluid collection overlying the right hip. 20 mL of cloudy yellow fluid was aspirated and sent for Gram stain, culture, and cytology. Electronically Signed   By: Miachel Roux M.D.   On: 08/25/2022 13:34   Inpatient: VAS [place] PAD ABI  Result Date: 08/25/2022  LOWER EXTREMITY DOPPLER STUDY Patient Name:  Kayla Baker  Date of Exam:   08/25/2022 Medical Rec #: [id number]    Accession #:    [id number] Date of Birth: [birth year]    Patient Gender: F Patient Age:   28 years Exam Location:  [facility] Procedure:      VAS [place] ABI WITH/WO TBI Referring Phys: Merlene Pulling --------------------------------------------------------------------------------  Indications: RLE pain and redness (PAD ABI) High Risk Factors: Hypertension, hyperlipidemia, Diabetes, no history of                    smoking. Other Factors: CKD.  Comparison Study: No previous exams Performing Technologist: Hill, Jody RVT, RDMS  Examination Guidelines: A complete evaluation includes at minimum, Doppler waveform signals and systolic blood pressure reading at the level of bilateral brachial, anterior tibial, and posterior tibial arteries, when vessel segments  are accessible. Bilateral testing is considered an integral part of a complete examination. Photoelectric Plethysmograph (PPG) waveforms and toe systolic pressure readings are included as required and additional duplex testing as needed. Limited examinations for reoccurring indications may be performed as noted.  ABI Findings: +---------+------------------+-----+---------+--------+ Right    Rt Pressure (mmHg)IndexWaveform Comment  +---------+------------------+-----+---------+--------+ Brachial 145                    triphasic         +---------+------------------+-----+---------+--------+ PTA      174  1.20 biphasic          +---------+------------------+-----+---------+--------+ DP       151               1.04 biphasic          +---------+------------------+-----+---------+--------+ Great Toe144               0.99 Normal            +---------+------------------+-----+---------+--------+ +---------+------------------+-----+---------+---------------------------+ Left     Lt Pressure (mmHg)IndexWaveform Comment                     +---------+------------------+-----+---------+---------------------------+ Brachial                        triphasicHX of breast cancer - no BP +---------+------------------+-----+---------+---------------------------+ PTA      184               1.27 biphasic                             +---------+------------------+-----+---------+---------------------------+ DP       162               1.12 biphasic                             +---------+------------------+-----+---------+---------------------------+ Great Toe138               0.95 Normal                               +---------+------------------+-----+---------+---------------------------+ +-------+-----------+-----------+------------+------------+ ABI/TBIToday's ABIToday's TBIPrevious ABIPrevious TBI +-------+-----------+-----------+------------+------------+  Right  1.20       0.99                                +-------+-----------+-----------+------------+------------+ Left   1.27       0.95                                +-------+-----------+-----------+------------+------------+   Summary: Right: Resting right ankle-brachial index is within normal range. Left: Resting left ankle-brachial index is within normal range. *See table(s) above for measurements and observations.  Electronically signed by Harold Barban MD on 08/25/2022 at 11:32:59 AM.    Final    CT ABDOMEN PELVIS W CONTRAST  Addendum Date: 08/24/2022   ADDENDUM REPORT: 08/24/2022 15:12 ADDENDUM: In addition, a persistent fluid collection with thin enhancing rim is seen in the right gluteal muscles, superficial to the greater trochanter of the right hip. This measures 6.2 x 2.2 cm and shows no significant change compared to recent study 2 days ago. Electronically Signed   By: Marlaine Hind M.D.   On: 08/24/2022 15:12   Result Date: 08/24/2022 CLINICAL DATA:  Worsening diffuse abdominal pain. Ovarian carcinoma. * Tracking Code: BO * EXAM: CT ABDOMEN AND PELVIS WITH CONTRAST TECHNIQUE: Multidetector CT imaging of the abdomen and pelvis was performed using the standard protocol following bolus administration of intravenous contrast. RADIATION DOSE REDUCTION: This exam was performed according to the departmental dose-optimization program which includes automated exposure control, adjustment of the mA and/or kV according to patient size and/or use of iterative reconstruction technique. CONTRAST:  162m OMNIPAQUE IOHEXOL 300 MG/ML  SOLN COMPARISON:  08/22/2022  FINDINGS: Lower Chest: No acute findings. Stable tiny bilateral pleural effusions. Hepatobiliary: Hepatic cirrhosis again demonstrated. No hepatic masses identified. Recanalization of paraumbilical veins again seen, consistent with portal venous hypertension. Gallbladder is unremarkable. No evidence of biliary ductal dilatation. Pancreas:  No  mass or inflammatory changes. Spleen: Stable splenomegaly, consistent with portal venous hypertension. Adrenals/Urinary Tract: No suspicious masses identified. No evidence of ureteral calculi or hydronephrosis. Stomach/Bowel: Stable mild diffuse gastric wall thickening, most likely due to portal gastropathy in setting of cirrhosis. No evidence of obstruction, acute inflammatory process, or abscess. Diverticulosis is seen mainly involving the sigmoid colon, however there is no evidence of diverticulitis. Vascular/Lymphatic: Stable sub-cm retroperitoneal lymph nodes in left para-aortic region. No pathologically enlarged lymph nodes. No acute vascular findings. Aortic atherosclerotic calcification incidentally noted. Reproductive: Prior hysterectomy. Peritoneal thickening and nodularity is again seen in the pelvis. Focal site in right adnexal region measures 4.2 x 2.5 cm, without significant change since prior study. Soft tissue stranding throughout the omental fat remains stable, with a few sub-centimeter peritoneal soft tissue densities in the left anterior abdomen. Moderate ascites, without significant change. Other:  None. Musculoskeletal:  No suspicious bone lesions identified. 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 Hepatic cirrhosis and findings of portal venous hypertension. No evidence of hepatic neoplasm. Colonic diverticulosis, without radiographic evidence of diverticulitis. Aortic Atherosclerosis (ICD10-I70.0). Electronically Signed: By: Marlaine Hind M.D. On: 08/24/2022 13:07    Assessment/Plan:  Right hip pain in patient with previous right hip hemiarthroplasty for fracture with ovarian cancer currently under chemotherapy and right gluteal fluid collection seen on CT - patient met criteria for ABI's per new limb ischemia protocol, normal ankle brachial index bilaterally  - No outward signs of infection of examination of surgical incision, but  she does have new pain with movement and weight bearing that just started in the last week - WBC 4.3, Sed Rate 62, CRP 9.5. Currently afebrile. - Patient underwent ultrasound guided aspiration of fluid collection with drain placement. Fluid has been sent for culture, gram stain, and sensitivity.  - ok for unrestricted motion for now, WBAT - will watch cultures at this time, currently on Unasyn for UTI, will plan to order repeat CT tomorrow vs. Wednesday to check on status of fluid collection   Contact information:   Merlene Pulling, PA-C Weekdays 8-5  After hours and holidays please check [email or web address] for group call information for [facility]  Ventura Bruns 08/25/2022, 1:44 PM

## 2022-08-26 DIAGNOSIS — N390 Urinary tract infection, site not specified: Secondary | ICD-10-CM | POA: Diagnosis not present

## 2022-08-26 DIAGNOSIS — E876 Hypokalemia: Secondary | ICD-10-CM | POA: Diagnosis not present

## 2022-08-26 DIAGNOSIS — M25551 Pain in right hip: Secondary | ICD-10-CM | POA: Diagnosis not present

## 2022-08-26 LAB — CBC
HCT: 29.7 % — ABNORMAL LOW (ref 36.0–46.0)
Hemoglobin: 9.3 g/dL — ABNORMAL LOW (ref 12.0–15.0)
MCH: 31.7 pg (ref 26.0–34.0)
MCHC: 31.3 g/dL (ref 30.0–36.0)
MCV: 101.4 fL — ABNORMAL HIGH (ref 80.0–100.0)
Platelets: 142 10*3/uL — ABNORMAL LOW (ref 150–400)
RBC: 2.93 MIL/uL — ABNORMAL LOW (ref 3.87–5.11)
RDW: 19.3 % — ABNORMAL HIGH (ref 11.5–15.5)
WBC: 6.6 10*3/uL (ref 4.0–10.5)
nRBC: 0 % (ref 0.0–0.2)

## 2022-08-26 LAB — COMPREHENSIVE METABOLIC PANEL
ALT: 33 U/L (ref 0–44)
AST: 67 U/L — ABNORMAL HIGH (ref 15–41)
Albumin: 2.4 g/dL — ABNORMAL LOW (ref 3.5–5.0)
Alkaline Phosphatase: 137 U/L — ABNORMAL HIGH (ref 38–126)
Anion gap: 8 (ref 5–15)
BUN: 18 mg/dL (ref 8–23)
CO2: 23 mmol/L (ref 22–32)
Calcium: 8.9 mg/dL (ref 8.9–10.3)
Chloride: 106 mmol/L (ref 98–111)
Creatinine, Ser: 1.39 mg/dL — ABNORMAL HIGH (ref 0.44–1.00)
GFR, Estimated: 37 mL/min — ABNORMAL LOW (ref >60)
Glucose, Bld: 108 mg/dL — ABNORMAL HIGH (ref 70–99)
Potassium: 4.3 mmol/L (ref 3.5–5.1)
Sodium: 137 mmol/L (ref 135–145)
Total Bilirubin: 0.9 mg/dL (ref 0.3–1.2)
Total Protein: 6.7 g/dL (ref 6.5–8.1)

## 2022-08-26 LAB — GLUCOSE, CAPILLARY
Glucose-Capillary: 96 mg/dL (ref 70–99)
Glucose-Capillary: 137 mg/dL — ABNORMAL HIGH (ref 70–99)
Glucose-Capillary: 84 mg/dL (ref 70–99)
Glucose-Capillary: 118 mg/dL — ABNORMAL HIGH (ref 70–99)

## 2022-08-26 NOTE — Progress Notes (Signed)
JP drain 10 ml serosanguinous out put.

## 2022-08-26 NOTE — Progress Notes (Addendum)
PROGRESS NOTE   Kayla Baker  RAQ:[id number]    DOB: Apr 13, 1936    DOA: 08/24/2022  PCP: [name], MD   I have briefly reviewed patients previous medical records in [place].  Chief Complaint  Patient presents with   Abdominal Pain    Brief Narrative:  86 year old married female with PMH of ovarian cancer with peritoneal carcinomatosis and fluid retention secondary to third spacing, anemia in neoplastic disease, CKD stage III, type II DM, GERD, gout, HLD, HTN, s/p right hip hemiarthroplasty 11/08/2021, presented to the ED on 08/22/2022 with complaints of abdominal distention, pain and pressure across her abdomen, nausea, decreased appetite and also with right hip pain with decreased mobility.  Dysuria, being treated for UTI with amoxicillin.  Admitted for acute cystitis, right hip pain with imaging showing right gluteal fluid collection.  Started on IV antibiotics.  Orthopedics consulted, who had IR aspirate and placed drain in the gluteal fluid.   Assessment & Plan:  Principal Problem:   Acute UTI (urinary tract infection) Active Problems:   Essential hypertension   Hyperlipidemia associated with type 2 diabetes mellitus (HCC)   DM2 (diabetes mellitus, type 2) (HCC)   Gout   Ovarian cancer ([place])   Hypomagnesemia   Stage 3b chronic kidney disease (CKD) (HCC)   Hypokalemia   Hyperbilirubinemia   Normocytic anemia   Thrombocytopenia (HCC)   Protein-calorie malnutrition, severe (HCC)   Right hip pain   Acute cystitis without hematuria: Urine culture 08/05/2022: Pansensitive E. coli.  Appropriately treated with amoxicillin as outpatient.  Now on Unasyn likely to cover for the gluteal fluid collection.  Completed 3 days so far, completed antibiotics for this indication.  Right hip pain, s/p previous right hip hemiarthroplasty for fracture, right gluteal fluid collection seen on CT: Orthopedics consulted and following.  IR consulted 9/25 and aspirated 20 mL of fluid, Gram  stain shows moderate WBC/predominantly PMNs, no organisms, follow final culture & cytology results.  Has drain in place, serosanguineous fluid.  Pain better.  Ambulated 500 feet with PT today.  As per orthopedics, may require I&D if concern for infection although currently appears asymptomatic.  Remains on IV Unasyn.  Ovarian cancer, peritoneal carcinomatosis with ascites: DC HCTZ.  Started low-dose Lasix and Aldactone.  Low-salt diet.  Fluid restriction.  Has had remote paracentesis but do not see need to have it done right away.  Dr. [name] input appreciated, seen patient along with her today.  Third spacing of fluid due to hypoalbuminemia.  She is postponing chemotherapy to next week.  Also per Dr. [name], she has discussed extensively with patient and family multiple times at the cancer center regarding [place] and they wish to continue aggressive care.  Hepatic cirrhosis with portal hypertension and ascites: Noted on CT abdomen.  Diuretic management as above.   Hypomagnesemia Replaced.    Stage 3b chronic kidney disease (CKD) (HCC) Creatinine has gone up slightly from 1.2-1.39.  She did get CT contrast on 9/24.  Lasix and Aldactone started 9/25.  If creatinine further increases in a.m., will need to hold diuretics.     Hypokalemia Replaced.     Essential hypertension Continue metoprolol 25 mg p.o. daily. Discontinue HCTZ.  Low-dose Lasix and Aldactone started controlled.     Hyperlipidemia associated with type 2 diabetes mellitus (HCC) Continue rosuvastatin 5 mg 3 times a week.     DM2 (diabetes mellitus, type 2) (HCC) Carbohydrate modified diet. Hold Amaryl. CBG monitoring before meals and bedtime. Reasonable inpatient control.  Gout Continue allopurinol.     Hyperbilirubinemia Resolved     Normocytic anemia/anemia in neoplastic disease Hemoglobin has dropped from 9.1-7.5 in the absence of overt bleeding.  The hemoglobin of 7.5 on 9/25 was likely an aberration.  Hemoglobin  up to 9.3 today.  Follow CBC daily and transfuse if hemoglobin 7 g or less.     Thrombocytopenia (HCC) Monitor platelet count.  Stable.     Protein-calorie malnutrition, severe (HCC) Protein supplementation. Consulted RD.    Body mass index is 25.8 kg/m.   DVT prophylaxis: SCDs Start: 08/24/22 1440     Code Status: Full Code:  Family Communication: Discussed in detail with patient's spouse at bedside Disposition:  Status is: Inpatient Remains inpatient appropriate because: IV antibiotics, following up on gluteal fluid aspiration results, may still need I&D.     Consultants:   Orthopedics Interventional radiology  Procedures:   Ultrasound-guided drain placement into right hip area collection by IR on 9/25.  Antimicrobials:   Unasyn   Subjective:  Patient seen this morning along with Dr. [name].  Spouse at bedside.  Right hip pain significantly improved and was able to ambulate in the hall.  Some abdominal distention/discomfort.  Objective:   Vitals:   08/26/22 0613 08/26/22 0700 08/26/22 1028 08/26/22 1412  BP: 114/61   (!) 142/67  Pulse: 74   90  Resp: 16   18  Temp: 97.6 F (36.4 C)   98.9 F (37.2 C)  TempSrc: Oral   Oral  SpO2: 90%   100%  Weight:  68.2 kg    Height:   5' 4"  (1.626 m)     General exam: Elderly female, moderately built, frail and chronically ill looking lying comfortably propped up in bed without distress. Respiratory system: Clear to auscultation.  No increased work of breathing. Cardiovascular system: S1 and S2 heard, RRR.  No JVD, murmurs or pedal edema.  Telemetry personally reviewed, sinus rhythm -discontinued telemetry. Gastrointestinal system: Abdomen is mildly distended, soft and nontender.  Ascites++.  No organomegaly or masses appreciated.  Normal bowel sounds heard.  Stable without change. Central nervous system: Alert and oriented. No focal neurological deficits. Extremities: Symmetric 5 x 5 power.  Right hip/greater  trochanteric area tenderness but no external swelling appreciated.  Right gluteal area drain with small amount of serosanguineous bloodstained fluid in the bulb. Skin: No rashes, lesions or ulcers Psychiatry: Judgement and insight appear normal. Mood & affect appropriate.     Data Reviewed:   I have personally reviewed following labs and imaging studies   CBC: Recent Labs  Lab 08/24/22 1029 08/25/22 0529 08/26/22 1258  WBC 6.2 4.3 6.6  NEUTROABS 3.8  --   --   HGB 9.1* 7.5* 9.3*  HCT 27.9* 24.1* 29.7*  MCV 98.2 101.3* 101.4*  PLT 145* 104* 142*    Basic Metabolic Panel: Recent Labs  Lab 08/22/22 1247 08/24/22 1029 08/25/22 0529 08/25/22 0644 08/26/22 1258  NA 138 137 138  --  137  K 3.5 3.4* 3.5  --  4.3  CL 108 105 108  --  106  CO2 24 23 24   --  23  GLUCOSE 124* 95 136*  --  108*  BUN 39* 30* 25*  --  18  CREATININE 1.11* 1.14* 1.20*  --  1.39*  CALCIUM 9.0 8.9 8.4*  --  8.9  MG  --  1.2*  --  1.8  --     Liver Function Tests: Recent Labs  Lab 08/22/22 1539  08/24/22 1029 08/25/22 0529 08/26/22 1258  AST 57* 42* 38 67*  ALT 34 26 23 33  ALKPHOS 107 97 85 137*  BILITOT 0.8 1.3* 0.8 0.9  PROT 5.9* 6.4* 5.2* 6.7  ALBUMIN 2.1* 2.3* 1.9* 2.4*    CBG: Recent Labs  Lab 08/26/22 0736 08/26/22 1122 08/26/22 1634  GLUCAP 96 137* 84    Microbiology Studies:   Recent Results (from the past 240 hour(s))  Aerobic/Anaerobic Culture w Gram Stain (surgical/deep wound)     Status: None (Preliminary result)   Collection Time: 08/25/22 12:36 PM   Specimen: Abscess  Result Value Ref Range Status   Specimen Description   Final    ABSCESS RIGHT HIP Performed at [facility] [address], [place], [place] [address]    Special Requests   Final    NONE Performed at [facility], [place] [address] [place], [place] [address]    Gram Stain   Final    MODERATE WBC PRESENT, PREDOMINANTLY PMN NO ORGANISMS SEEN    Culture    Final    NO GROWTH < 24 HOURS Performed at [facility] [address] [place], [place] [address]    Report Status PENDING  Incomplete    Radiology Studies:  [place] DRAIN/INJ MAJOR JOINT/BURSA  Result Date: 08/25/2022 INDICATION: 86 year old woman with history of ovarian malignancy and prior right hip replacement presents to IR for drainage of fluid collection overlying the right hip. EXAM: Ultrasound-guided drain placement in fluid collection overlying right hip. MEDICATIONS: The patient is currently admitted to the hospital and receiving intravenous antibiotics. The antibiotics were administered within an appropriate time frame prior to the initiation of the procedure. ANESTHESIA/SEDATION: None COMPLICATIONS: None immediate. PROCEDURE: Informed written consent was obtained from the patient after a thorough discussion of the procedural risks, benefits and alternatives. All questions were addressed. Maximal Sterile Barrier Technique was utilized including caps, mask, sterile gowns, sterile gloves, sterile drape, hand hygiene and skin antiseptic. A timeout was performed prior to the initiation of the procedure. Patient positioned left lateral decubitus on the procedure table. The skin overlying the right hip was prepped and draped in usual fashion. Ultrasound evaluation of the right hip again showed the fluid collection between the gluteal musculature. Following local lidocaine administration, the right hip fluid collection was accessed with 19 gauge Yueh needle utilizing continuous ultrasound guidance. Yellow cloudy fluid was aspirated and sent for Gram stain and culture, as well as, cytology. The Yueh catheter was exchanged for 10.2 [place] multipurpose pigtail catheter over 0.035 inch guidewire. Drain was secured to skin with suture and connected to bulb suction. IMPRESSION: Ultrasound-guided drain placement in fluid collection overlying the right hip. 20 mL of cloudy yellow fluid was aspirated and  sent for Gram stain, culture, and cytology. Electronically Signed   By: Miachel Roux M.D.   On: 08/25/2022 13:34   Inpatient: VAS [place] PAD ABI  Result Date: 08/25/2022  LOWER EXTREMITY DOPPLER STUDY Patient Name:  JOHNATHON MITTAL  Date of Exam:   08/25/2022 Medical Rec #: [id number]    Accession #:    [id number] Date of Birth: 1936/08/10    Patient Gender: F Patient Age:   81 years Exam Location[facility]  [facility] Procedure:      VAS [place] ABI WITH/WO TBI Referring Phys: Merlene Pulling --------------------------------------------------------------------------------  Indications: RLE pain and redness (PAD ABI) High Risk Factors: Hypertension, hyperlipidemia, Diabetes, no history of  smoking. Other Factors: CKD.  Comparison Study: No previous exams Performing Technologist: Hill, Jody RVT, RDMS  Examination Guidelines: A complete evaluation includes at minimum, Doppler waveform signals and systolic blood pressure reading at the level of bilateral brachial, anterior tibial, and posterior tibial arteries, when vessel segments are accessible. Bilateral testing is considered an integral part of a complete examination. Photoelectric Plethysmograph (PPG) waveforms and toe systolic pressure readings are included as required and additional duplex testing as needed. Limited examinations for reoccurring indications may be performed as noted.  ABI Findings: +---------+------------------+-----+---------+--------+ Right    Rt Pressure (mmHg)IndexWaveform Comment  +---------+------------------+-----+---------+--------+ Brachial 145                    triphasic         +---------+------------------+-----+---------+--------+ PTA      174               1.20 biphasic          +---------+------------------+-----+---------+--------+ DP       151               1.04 biphasic          +---------+------------------+-----+---------+--------+ Great Toe144               0.99 Normal             +---------+------------------+-----+---------+--------+ +---------+------------------+-----+---------+---------------------------+ Left     Lt Pressure (mmHg)IndexWaveform Comment                     +---------+------------------+-----+---------+---------------------------+ Brachial                        triphasicHX of breast cancer - no BP +---------+------------------+-----+---------+---------------------------+ PTA      184               1.27 biphasic                             +---------+------------------+-----+---------+---------------------------+ DP       162               1.12 biphasic                             +---------+------------------+-----+---------+---------------------------+ Great Toe138               0.95 Normal                               +---------+------------------+-----+---------+---------------------------+ +-------+-----------+-----------+------------+------------+ ABI/TBIToday's ABIToday's TBIPrevious ABIPrevious TBI +-------+-----------+-----------+------------+------------+ Right  1.20       0.99                                +-------+-----------+-----------+------------+------------+ Left   1.27       0.95                                +-------+-----------+-----------+------------+------------+   Summary: Right: Resting right ankle-brachial index is within normal range. Left: Resting left ankle-brachial index is within normal range. *See table(s) above for measurements and observations.  Electronically signed by Harold Barban MD on 08/25/2022 at 11:32:59 AM.    Final     Scheduled Meds:    allopurinol  100 mg Oral  Daily   ezetimibe  10 mg Oral Daily   furosemide  20 mg Oral Daily   gabapentin  300 mg Oral QID   magnesium oxide  400 mg Oral Daily   metoprolol succinate  25 mg Oral Daily   polyethylene glycol  17 g Oral BID   rosuvastatin  5 mg Oral Q M,W,F   spironolactone  50 mg Oral Daily    Continuous Infusions:     ampicillin-sulbactam (UNASYN) IV 3 g (08/26/22 1421)     LOS: 2 days     Vernell Leep, MD,  FACP, FHM, SFHM, [facility], [place]   To contact the attending provider between 7A-7P or the covering provider during after hours 7P-7A, please log into the web site [email or web address] and access using universal [place] password for that web site. If you do not have the password, please call the hospital operator.  08/26/2022, 5:06 PM

## 2022-08-26 NOTE — TOC Initial Note (Signed)
Transition of Care [facility]) - Initial/Assessment Note    Patient Details  Name: Kayla Baker MRN: [id number] Date of Birth: 01/30/1936  Transition of Care [facility]) CM/SW Contact:    Leeroy Cha, RN Phone Number: 08/26/2022, 10:28 AM  Clinical Narrative:                  Transition of Care [facility]) Screening Note   Patient Details  Name: Kayla Baker Date of Birth: September 30, 1936   Transition of Care [facility]) CM/SW Contact:    Leeroy Cha, RN Phone Number: 08/26/2022, 10:28 AM    Transition of Care Department (TOC) has reviewed patient and no TOC needs have been identified at this time. We will continue to monitor patient advancement through interdisciplinary progression rounds. If new patient transition needs arise, please place a TOC consult.    Expected Discharge Plan: Home/Self Care Barriers to Discharge: Continued Medical Work up   Patient Goals and CMS Choice Patient states their goals for this hospitalization and ongoing recovery are:: to go home asap CMS Medicare.gov Compare Post Acute Care list provided to:: Patient    Expected Discharge Plan and Services Expected Discharge Plan: Home/Self Care       Living arrangements for the past 2 months: Single Family Home                                      Prior Living Arrangements/Services Living arrangements for the past 2 months: Single Family Home Lives with:: Spouse Patient language and need for interpreter reviewed:: Yes Do you feel safe going back to the place where you live?: Yes            Criminal Activity/Legal Involvement Pertinent to Current Situation/Hospitalization: No - Comment as needed  Activities of Daily Living Home Assistive Devices/Equipment: Walker (specify type) ADL Screening (condition at time of admission) Patient's cognitive ability adequate to safely complete daily activities?: Yes Is the patient deaf or have difficulty hearing?: No Does the patient have difficulty seeing,  even when wearing glasses/contacts?: No Does the patient have difficulty concentrating, remembering, or making decisions?: No Patient able to express need for assistance with ADLs?: Yes Does the patient have difficulty dressing or bathing?: No Independently performs ADLs?: No Communication: Independent Dressing (OT): Independent Grooming: Independent Feeding: Independent Bathing: Independent Toileting: Needs assistance Is this a change from baseline?: Pre-admission baseline In/Out Bed: Needs assistance Is this a change from baseline?: Pre-admission baseline Walks in Home: Needs assistance Is this a change from baseline?: Pre-admission baseline Does the patient have difficulty walking or climbing stairs?: Yes Weakness of Legs: Both Weakness of Arms/Hands: Both  Permission Sought/Granted                  Emotional Assessment Appearance:: Appears stated age Attitude/Demeanor/Rapport: Engaged Affect (typically observed): Calm Orientation: : Oriented to Self, Oriented to Place, Oriented to  Time, Oriented to Situation Alcohol / Substance Use: Never Used Psych Involvement: No (comment)  Admission diagnosis:  Hypokalemia [E87.6] Peritoneal carcinomatosis (HCC) [C78.6] Acute UTI (urinary tract infection) [N39.0] Abdominal pain, unspecified abdominal location [R10.9] Urinary tract infection without hematuria, site unspecified [N39.0] Patient Active Problem List   Diagnosis Date Noted   Acute UTI (urinary tract infection) 08/24/2022   Hypokalemia 08/24/2022   Hyperbilirubinemia 08/24/2022   Normocytic anemia 08/24/2022   Thrombocytopenia ([place]) 08/24/2022   Protein-calorie malnutrition, severe ([place]) 08/24/2022   Right hip pain 08/24/2022  Acute pancreatitis 08/09/2022   Abdominal pain 08/05/2022   Stage 3b chronic kidney disease (CKD) ([place]) 08/05/2022   Neutropenia ([place]) 08/05/2022   AKI (acute kidney injury) ([place]) 08/05/2022   Generalized weakness    Facial rash  07/25/2022   Diarrhea 05/12/2022   Hypomagnesemia 02/22/2022   Poor dentition 07/26/2021   Nodule of lower lobe of right lung 06/27/2021   Aortic atherosclerosis ([place]) by Abd CT scan on 05/10/2021 06/10/2021   Mild nonproliferative diabetic retinopathy of both eyes without macular edema associated with type 2 diabetes mellitus ([place]) 02/20/2021   Osteopenia 02/20/2021   Genetic testing 09/14/2020   Pancytopenia, acquired ([place]) 05/15/2020   Cancer associated pain 05/15/2020   Anemia in neoplastic disease 05/03/2020   Goals of care, counseling/discussion 04/20/2020   Ovarian cancer ([place]) 04/19/2020   Edema of right lower leg 04/05/2020   CKD stage 3 due to type 2 diabetes mellitus ([place]) 07/05/2018   Adhesive capsulitis of right shoulder 04/27/2018   Postoperative breast asymmetry 04/01/2017   History of therapeutic radiation 04/01/2017   Port catheter in place 09/18/2016   Chemotherapy-induced peripheral neuropathy ([place]) 08/07/2016   History of left breast cancer 02/29/2016   Gout 12/08/2014   Medication management 08/27/2014   Essential hypertension 11/10/2013   Hyperlipidemia associated with type 2 diabetes mellitus ([place]) 11/10/2013   DM2 (diabetes mellitus, type 2) ([place]) 11/10/2013   Vitamin D deficiency 11/10/2013   PCP:  [name], MD Pharmacy:   [facility] [place], [place] - [address] AT [place] [address] [place] [address] Phone: [phone] Fax: [phone]  [facility] [place], [place] [address] AT [facility] OF [address] [place] [address] Phone: [phone] Fax: [facility] [phone] - [place], [place] - [address] [place] [address] Phone: [phone] Fax: [place] [address] Phone: [phone] Fax:  [phone]     Social Determinants of Health (SDOH) Interventions Food Insecurity Interventions: Intervention Not Indicated Utilities Interventions: Intervention Not Indicated  Readmission Risk Interventions   Row Labels 08/26/2022   10:27 AM  Readmission Risk Prevention Plan   Section Header. No data exists in this row.   Transportation Screening   Complete  PCP or Specialist Appt within 3-5 Days   Complete  HRI or [place]   Complete  Social Work Consult for [place] Planning/Counseling   Complete  Palliative Care Screening   Not Applicable  Medication Review Press photographer)   Complete

## 2022-08-26 NOTE — Progress Notes (Signed)
Kayla Baker   DOB:[birth year]   FA#:[id number]    ASSESSMENT & PLAN:  Recurrent ovarian cancer She received first dose of Taxotere recently, complicated by facial rash and loss of appetite The chemotherapy itself is not the direct cause of pancreatitis but overall, she is quite weak due to pancreatitis When I saw her recently, she has gained a lot of fluid due to third spacing and her treatment was further delayed She is supposed to get chemotherapy end of the week but due to hospitalization and infection, she would need delay of treatment to next week Continue supportive care for now   Acquired pancytopenia Due to recent treatment Transfusion as needed  Fluid collection The fluid collection was aspirated Continue supportive care  Recurrent ascites, fluid retention I agree with gentle diuresis as tolerated      Code Status Full code   Discharge planning We will defer to primary service I will reschedule appointment till next week  All questions were answered. The patient knows to call the clinic with any problems, questions or concerns.   The total time spent in the appointment was 30 minutes encounter with patients including review of chart and various tests results, discussions about plan of care and coordination of care plan  Kayla Lark, MD 08/26/2022 10:53 AM  Subjective:  I was notified of her recurrent admission to the hospital She had severe pain on the right hip as well as progressive abdominal distention She had multiple imaging studies performed as well as procedure performed with aspiration of fluid collection near her right hip Multiple cultures are pending Her pain has slightly improved Her leg swelling is slightly improved  Objective:  Vitals:   08/25/22 2240 08/26/22 0613  BP: (!) 117/55 114/61  Pulse: 91 74  Resp: 18 16  Temp: 98.3 F (36.8 C) 97.6 F (36.4 C)  SpO2: 96% 90%     Intake/Output Summary (Last 24 hours) at 08/26/2022 1053 Last data filed  at 08/26/2022 0858 Gross per 24 hour  Intake 980 ml  Output 3 ml  Net 977 ml    GENERAL:alert, no distress and comfortable HEART:  lower extremity edema bilaterally ABDOMEN:abdomen appears distended with ascites NEURO: alert & oriented x 3 with fluent speech, no focal motor/sensory deficits   Labs:  Recent Labs    08/22/22 1247 08/22/22 1539 08/24/22 1029 08/25/22 0529  NA 138  --  137 138  K 3.5  --  3.4* 3.5  CL 108  --  105 108  CO2 24  --  23 24  GLUCOSE 124*  --  95 136*  BUN 39*  --  30* 25*  CREATININE 1.11*  --  1.14* 1.20*  CALCIUM 9.0  --  8.9 8.4*  GFRNONAA 49*  --  47* 45*  PROT  --  5.9* 6.4* 5.2*  ALBUMIN  --  2.1* 2.3* 1.9*  AST  --  57* 42* 38  ALT  --  34 26 23  ALKPHOS  --  107 97 85  BILITOT  --  0.8 1.3* 0.8  BILIDIR  --  0.2  --   --   IBILI  --  0.6  --   --     Studies: I have reviewed her recent CT imaging [place] DRAIN/INJ MAJOR JOINT/BURSA  Result Date: 08/25/2022 INDICATION: 86 year old woman with history of ovarian malignancy and prior right hip replacement presents to IR for drainage of fluid collection overlying the right hip. EXAM: Ultrasound-guided drain placement in fluid  collection overlying right hip. MEDICATIONS: The patient is currently admitted to the hospital and receiving intravenous antibiotics. The antibiotics were administered within an appropriate time frame prior to the initiation of the procedure. ANESTHESIA/SEDATION: None COMPLICATIONS: None immediate. PROCEDURE: Informed written consent was obtained from the patient after a thorough discussion of the procedural risks, benefits and alternatives. All questions were addressed. Maximal Sterile Barrier Technique was utilized including caps, mask, sterile gowns, sterile gloves, sterile drape, hand hygiene and skin antiseptic. A timeout was performed prior to the initiation of the procedure. Patient positioned left lateral decubitus on the procedure table. The skin overlying the right hip  was prepped and draped in usual fashion. Ultrasound evaluation of the right hip again showed the fluid collection between the gluteal musculature. Following local lidocaine administration, the right hip fluid collection was accessed with 19 gauge Yueh needle utilizing continuous ultrasound guidance. Yellow cloudy fluid was aspirated and sent for Gram stain and culture, as well as, cytology. The Yueh catheter was exchanged for 10.2 [place] multipurpose pigtail catheter over 0.035 inch guidewire. Drain was secured to skin with suture and connected to bulb suction. IMPRESSION: Ultrasound-guided drain placement in fluid collection overlying the right hip. 20 mL of cloudy yellow fluid was aspirated and sent for Gram stain, culture, and cytology. Electronically Signed   By: Miachel Roux M.D.   On: 08/25/2022 13:34   Inpatient: VAS [place] PAD ABI  Result Date: 08/25/2022  LOWER EXTREMITY DOPPLER STUDY Patient Name:  Kayla Baker  Date of Exam:   08/25/2022 Medical Rec #: [id number]    Accession #:    [id number] Date of Birth: December 29, 1935    Patient Gender: F Patient Age:   49 years Exam Location:  [facility] Procedure:      VAS [place] ABI WITH/WO TBI Referring Phys: Merlene Pulling --------------------------------------------------------------------------------  Indications: RLE pain and redness (PAD ABI) High Risk Factors: Hypertension, hyperlipidemia, Diabetes, no history of                    smoking. Other Factors: CKD.  Comparison Study: No previous exams Performing Technologist: Hill, Jody RVT, RDMS  Examination Guidelines: A complete evaluation includes at minimum, Doppler waveform signals and systolic blood pressure reading at the level of bilateral brachial, anterior tibial, and posterior tibial arteries, when vessel segments are accessible. Bilateral testing is considered an integral part of a complete examination. Photoelectric Plethysmograph (PPG) waveforms and toe systolic pressure readings are included as  required and additional duplex testing as needed. Limited examinations for reoccurring indications may be performed as noted.  ABI Findings: +---------+------------------+-----+---------+--------+ Right    Rt Pressure (mmHg)IndexWaveform Comment  +---------+------------------+-----+---------+--------+ Brachial 145                    triphasic         +---------+------------------+-----+---------+--------+ PTA      174               1.20 biphasic          +---------+------------------+-----+---------+--------+ DP       151               1.04 biphasic          +---------+------------------+-----+---------+--------+ Great Toe144               0.99 Normal            +---------+------------------+-----+---------+--------+ +---------+------------------+-----+---------+---------------------------+ Left     Lt Pressure (mmHg)IndexWaveform Comment                     +---------+------------------+-----+---------+---------------------------+  Brachial                        triphasicHX of breast cancer - no BP +---------+------------------+-----+---------+---------------------------+ PTA      184               1.27 biphasic                             +---------+------------------+-----+---------+---------------------------+ DP       162               1.12 biphasic                             +---------+------------------+-----+---------+---------------------------+ Great Toe138               0.95 Normal                               +---------+------------------+-----+---------+---------------------------+ +-------+-----------+-----------+------------+------------+ ABI/TBIToday's ABIToday's TBIPrevious ABIPrevious TBI +-------+-----------+-----------+------------+------------+ Right  1.20       0.99                                +-------+-----------+-----------+------------+------------+ Left   1.27       0.95                                 +-------+-----------+-----------+------------+------------+   Summary: Right: Resting right ankle-brachial index is within normal range. Left: Resting left ankle-brachial index is within normal range. *See table(s) above for measurements and observations.  Electronically signed by Harold Barban MD on 08/25/2022 at 11:32:59 AM.    Final    CT ABDOMEN PELVIS W CONTRAST  Addendum Date: 08/24/2022   ADDENDUM REPORT: 08/24/2022 15:12 ADDENDUM: In addition, a persistent fluid collection with thin enhancing rim is seen in the right gluteal muscles, superficial to the greater trochanter of the right hip. This measures 6.2 x 2.2 cm and shows no significant change compared to recent study 2 days ago. Electronically Signed   By: Marlaine Hind M.D.   On: 08/24/2022 15:12   Result Date: 08/24/2022 CLINICAL DATA:  Worsening diffuse abdominal pain. Ovarian carcinoma. * Tracking Code: BO * EXAM: CT ABDOMEN AND PELVIS WITH CONTRAST TECHNIQUE: Multidetector CT imaging of the abdomen and pelvis was performed using the standard protocol following bolus administration of intravenous contrast. RADIATION DOSE REDUCTION: This exam was performed according to the departmental dose-optimization program which includes automated exposure control, adjustment of the mA and/or kV according to patient size and/or use of iterative reconstruction technique. CONTRAST:  169m OMNIPAQUE IOHEXOL 300 MG/ML  SOLN COMPARISON:  08/22/2022 FINDINGS: Lower Chest: No acute findings. Stable tiny bilateral pleural effusions. Hepatobiliary: Hepatic cirrhosis again demonstrated. No hepatic masses identified. Recanalization of paraumbilical veins again seen, consistent with portal venous hypertension. Gallbladder is unremarkable. No evidence of biliary ductal dilatation. Pancreas:  No mass or inflammatory changes. Spleen: Stable splenomegaly, consistent with portal venous hypertension. Adrenals/Urinary Tract: No suspicious masses identified. No evidence of  ureteral calculi or hydronephrosis. Stomach/Bowel: Stable mild diffuse gastric wall thickening, most likely due to portal gastropathy in setting of cirrhosis. No evidence of obstruction, acute inflammatory process, or abscess. Diverticulosis is seen mainly involving the sigmoid colon, however there is no  evidence of diverticulitis. Vascular/Lymphatic: Stable sub-cm retroperitoneal lymph nodes in left para-aortic region. No pathologically enlarged lymph nodes. No acute vascular findings. Aortic atherosclerotic calcification incidentally noted. Reproductive: Prior hysterectomy. Peritoneal thickening and nodularity is again seen in the pelvis. Focal site in right adnexal region measures 4.2 x 2.5 cm, without significant change since prior study. Soft tissue stranding throughout the omental fat remains stable, with a few sub-centimeter peritoneal soft tissue densities in the left anterior abdomen. Moderate ascites, without significant change. Other:  None. Musculoskeletal:  No suspicious bone lesions identified. 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 Hepatic cirrhosis and findings of portal venous hypertension. No evidence of hepatic neoplasm. Colonic diverticulosis, without radiographic evidence of diverticulitis. Aortic Atherosclerosis (ICD10-I70.0). Electronically Signed: By: Marlaine Hind M.D. On: 08/24/2022 13:07   VAS [place] LOWER EXTREMITY VENOUS (DVT) (7a-7p)  Result Date: 08/22/2022  Lower Venous DVT Study Patient Name:  ARISTA KETTLEWELL Berghuis  Date of Exam:   08/22/2022 Medical Rec #: [id number]    Accession #:    [id number] Date of Birth: 07-01-1936    Patient Gender: F Patient Age:   21 years Exam Location:  [facility] Procedure:      VAS [place] LOWER EXTREMITY VENOUS (DVT) Referring Phys: Nicki Reaper [place] --------------------------------------------------------------------------------  Indications: Edema.  Limitations: Poor ultrasound/tissue interface and  patient positioning. Comparison Study: No prior studies. Performing Technologist: Oliver Hum RVT  Examination Guidelines: A complete evaluation includes B-mode imaging, spectral Doppler, color Doppler, and power Doppler as needed of all accessible portions of each vessel. Bilateral testing is considered an integral part of a complete examination. Limited examinations for reoccurring indications may be performed as noted. The reflux portion of the exam is performed with the patient in reverse Trendelenburg.  +---------+---------------+---------+-----------+----------+--------------+ RIGHT    CompressibilityPhasicitySpontaneityPropertiesThrombus Aging +---------+---------------+---------+-----------+----------+--------------+ CFV      Full           Yes      Yes                                 +---------+---------------+---------+-----------+----------+--------------+ SFJ      Full                                                        +---------+---------------+---------+-----------+----------+--------------+ FV Prox  Full                                                        +---------+---------------+---------+-----------+----------+--------------+ FV Mid   Full                                                        +---------+---------------+---------+-----------+----------+--------------+ FV Distal               Yes      Yes                                 +---------+---------------+---------+-----------+----------+--------------+  PFV      Full                                                        +---------+---------------+---------+-----------+----------+--------------+ POP      Full           Yes      Yes                                 +---------+---------------+---------+-----------+----------+--------------+ PTV      Full                                                         +---------+---------------+---------+-----------+----------+--------------+ PERO     Full                                                        +---------+---------------+---------+-----------+----------+--------------+   +----+---------------+---------+-----------+----------+--------------+ LEFTCompressibilityPhasicitySpontaneityPropertiesThrombus Aging +----+---------------+---------+-----------+----------+--------------+ CFV Full           Yes      Yes                                 +----+---------------+---------+-----------+----------+--------------+     Summary: RIGHT: - There is no evidence of deep vein thrombosis in the lower extremity. However, portions of this examination were limited- see technologist comments above.  - No cystic structure found in the popliteal fossa.  LEFT: - No evidence of common femoral vein obstruction.  *See table(s) above for measurements and observations. Electronically signed by Jamelle Haring on 08/22/2022 at 10:46:19 PM.    Final    CT ABDOMEN PELVIS W CONTRAST  Result Date: 08/22/2022 CLINICAL DATA:  A female at age 67 with history of ovarian cancer presents for evaluation of abdominal pain. * Tracking Code: BO * EXAM: CT ABDOMEN AND PELVIS WITH CONTRAST TECHNIQUE: Multidetector CT imaging of the abdomen and pelvis was performed using the standard protocol following bolus administration of intravenous contrast. RADIATION DOSE REDUCTION: This exam was performed according to the departmental dose-optimization program which includes automated exposure control, adjustment of the mA and/or kV according to patient size and/or use of iterative reconstruction technique. CONTRAST:  191m OMNIPAQUE IOHEXOL 300 MG/ML  SOLN COMPARISON:  August 05, 2022. FINDINGS: Lower chest: Basilar atelectasis. No effusion. No consolidative changes. Central venous access device terminates in the upper RIGHT atrium. No substantial pericardial effusion or thickening.  Hepatobiliary: Perihepatic ascites increased since June 11, 2022. Low-density area adjacent to the supra hepatic inferior vena cava which shows a "narrowed" appearance but appears patent, similar appearance to pre treatment images from May of 2021 where there was a similar volume of ascites and narrowed appearance. Perhaps this is related to mild mass effect or physiologic changes, this is not clear. As compared to noncontrast imaging from August 05, 2022 this may be similar but it represents a change compared to June 11, 2022  and the inferior vena cava appears more [facility] this level. No pericholecystic stranding. No gallbladder distension or biliary duct dilation. Fissural widening of hepatic fissures. Pancreas: Generalized edema throughout the abdomen without substantial asymmetric edema about the pancreas or peripancreatic fluid. No ductal dilation. Preservation of normal pancreatic parenchymal architecture. Spleen: Perisplenic ascites. No parenchymal lesion. Mild splenomegaly. Adrenals/Urinary Tract: Adrenal glands are normal. Symmetric renal enhancement without hydronephrosis. Marked irregularity of the low volume urinary bladder that displays extensive mucosal enhancement. Limited assessment due to streak artifact from RIGHT hip arthroplasty. No suspicious renal lesion. No ureteral distension. Stomach/Bowel: Enhancement in the pelvis of fascial planes and along the serosa of the sigmoid colon is similar in terms of distribution now associated with more ascites than on the study of June 11 2022. When compared to the more recent study of August 05, 2022 and implant in the RIGHT pelvis measuring 4.8 x 3.2 cm is unchanged though the volume of ascites again is moderate and new in the very short interval since the prior study. Bowel floats in ascites. The appendix is not visible though there are no secondary signs to suggest acute appendiceal process. Stool fills much of the colon without signs of  colonic obstruction and with serosal and pelvic disease as outlined above extending across the top of the adjacent urinary bladder in the area of the sigmoid. Vascular/Lymphatic: Small retroperitoneal lymph nodes are unchanged compared to recent imaging largest along the LEFT para-aortic chain. Patent abdominal vessels on venous phase. Calcified and noncalcified aortic atherosclerotic plaque. Reproductive: Post hysterectomy with soft tissue in the pelvis as discussed. Other: Increase in ascites as discussed. No pneumoperitoneum. Stable appearance of peritoneal nodularity over the short interval, for example in addition to the pelvic soft tissue there are diffuse areas of omental and peritoneal nodularity also an implant along the RIGHT hemiabdomen measuring 2.5 x 1.3 cm. Musculoskeletal: Post RIGHT hip arthroplasty. Small amount of fluid adjacent to the RIGHT hip in the gluteal musculature with some peripheral enhancement (image 76/2) perhaps mild stranding in this area on the previous study with tract from prior postoperative change in the subcutaneous fat. This intramuscular collection was however not present in July. It currently measures 6.1 x 1.8 cm No destructive bone process or acute bone finding. IMPRESSION: 1. Signs of marked cystitis, not well evaluated. 2. Interval development of moderate volume ascites in the short interval, more similar to more remote images. Significance is uncertain but could be related to decompensation of underlying liver disease superimposed on signs of peritoneal carcinomatosis. Sampling of this fluid may be helpful. Would also correlate with hepatic function. 3. Narrowed appearance of the supra hepatic inferior vena cava just below the heart is of uncertain significance. Small volume ascites in this area without soft tissue. The inferior vena cava appears more engorged below this level than on previous imaging. Given that this is just fluid density in the area mass effect is felt  unlikely in the appearance is not substantially different from 2021. Close attention on follow-up is suggested. If this is a persistent abnormality or there is worsening without clear cause could consider pressure transduction above and below this area it to determine whether this is of physiologic significance. 4. Small amount of fluid adjacent to the RIGHT hip in the gluteal musculature with some peripheral enhancement. This was however not present in July. It currently measures 6.1 x 1.8 cm. This was not present in July and may represent hematoma from fall or infection/abscess. Correlate with any interval  surgical revision of this hip since July. 5. Stable appearance of underlying oncologic findings of peritoneal carcinomatosis and retroperitoneal adenopathy. 6. Mild splenomegaly may be due to underlying liver disease and portal hypertension. Aortic Atherosclerosis (ICD10-I70.0). Electronically Signed   By: Zetta Bills M.D.   On: 08/22/2022 16:22   CT Head Wo Contrast  Result Date: 08/22/2022 CLINICAL DATA:  Altered mental status, history of ovarian cancer EXAM: CT HEAD WITHOUT CONTRAST TECHNIQUE: Contiguous axial images were obtained from the base of the skull through the vertex without intravenous contrast. RADIATION DOSE REDUCTION: This exam was performed according to the departmental dose-optimization program which includes automated exposure control, adjustment of the mA and/or kV according to patient size and/or use of iterative reconstruction technique. COMPARISON:  None Available. FINDINGS: Brain: No evidence of acute infarction, hemorrhage, mass, mass effect, or midline shift. No hydrocephalus or extra-axial fluid collection. Periventricular white matter changes, likely the sequela of chronic small vessel ischemic disease. Vascular: No hyperdense vessel. Atherosclerotic calcifications in the intracranial carotid and vertebral arteries. Skull: Normal. Negative for fracture or focal lesion.  Sinuses/Orbits: No acute finding. Status post bilateral lens replacements. Other: The mastoid air cells are well aerated. IMPRESSION: No acute intracranial process. Electronically Signed   By: Merilyn Baba M.D.   On: 08/22/2022 13:07   DG Hip Unilat With Pelvis 2-3 Views Right  Result Date: 08/22/2022 CLINICAL DATA:  Right hip pain.  Previous hip replacement. EXAM: DG HIP (WITH OR WITHOUT PELVIS) 2-3V RIGHT COMPARISON:  Right hip radiographs 11/08/2021 FINDINGS: Redemonstration of right hip bipolar hemiarthroplasty. No perihardware lucency is seen to indicate hardware failure or loosening. Mild superior left femoroacetabular joint space narrowing. Surgical clips overlie the left hemipelvis. IMPRESSION: Status post right hip arthroplasty without evidence of hardware failure. Electronically Signed   By: Yvonne Kendall M.D.   On: 08/22/2022 12:41   CT ABDOMEN PELVIS WO CONTRAST  Result Date: 08/05/2022 CLINICAL DATA:  Left-sided abdominal pain. History of ovarian cancer. * Tracking Code: BO * EXAM: CT ABDOMEN AND PELVIS WITHOUT CONTRAST TECHNIQUE: Multidetector CT imaging of the abdomen and pelvis was performed following the standard protocol without IV contrast. RADIATION DOSE REDUCTION: This exam was performed according to the departmental dose-optimization program which includes automated exposure control, adjustment of the mA and/or kV according to patient size and/or use of iterative reconstruction technique. COMPARISON:  06/11/2022 FINDINGS: Lower chest: The lung bases are clear of an acute process. Minimal streaky bibasilar atelectasis. The heart is normal in size. No pericardial effusion. Hepatobiliary: No hepatic lesions are identified without contrast. No intrahepatic biliary dilatation. Small layering gallstones suspected in the gallbladder. No findings for acute cholecystitis. No common bile duct dilatation. Pancreas: Diffuse inflammation/edema in and around the pancreas consistent with diffuse  interstitial pancreatitis. Spleen: Stable mild splenomegaly. Adrenals/Urinary Tract: Adrenal glands and kidneys are. Bladder is grossly normal. Stomach/Bowel: The stomach, duodenum, small bowel and colon are grossly no. Vascular/Lymphatic: Moderate atherosclerotic calcification involving the aorta but no aneurysm. Stable scattered mesenteric and retroperitoneal lymph nodes and progressive peritoneal carcinomatosis. Numerous omental and peritoneal implants slightly progressive when compared to the prior study. The soft tissue lesion in the right lower quadrant adjacent to the ascending colon measures 2.6 x 2.1 cm and previously measured 2.5 x 2.0 cm. Multiple pelvic implants are grossly stable to slightly enlarged. Reproductive: Surgically absent. Other: Small amount of free pelvic fluid noted. Musculoskeletal: No significant bony findings. IMPRESSION: 1. 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 2. Slight interval progression of peritoneal carcinomatosis.  Aortic Atherosclerosis (ICD10-I70.0). Electronically Signed   By: Marijo Sanes M.D.   On: 08/05/2022 14:20   DG Chest Port 1 View  Result Date: 08/05/2022 CLINICAL DATA:  Weakness with decreased p.o. intake. EXAM: PORTABLE CHEST 1 VIEW COMPARISON:  November 07, 2021 FINDINGS: EKG leads project over the chest. RIGHT-sided Port-A-Cath terminating at the caval to atrial junction. Mild rotation to the RIGHT. Accounting for this cardiomediastinal contours and hilar structures are normal. Lungs are clear.  No sign of effusion.  No pneumothorax. Post LEFT breast lumpectomy. On limited assessment no acute skeletal findings. IMPRESSION: 1. No acute cardiopulmonary disease. Electronically Signed   By: Zetta Bills M.D.   On: 08/05/2022 09:59

## 2022-08-26 NOTE — Progress Notes (Signed)
Subjective:  Patient reports pain as minimal today.  Still has some soreness in the buttock region.  She denies any groin pain.  She worked very well with physical therapy ambulating over 400 feet yesterday with a rolling walker.  IR aspiration yesterday obtained 20 cc of fluid sent for culture.  Drain also left in the gluteal fluid collection.  10 cc of serosanguineous fluid emptied by nursing this morning.  Objective:   VITALS:   Vitals:   08/25/22 1322 08/25/22 2240 08/26/22 0613 08/26/22 0700  BP: (!) 130/57 (!) 117/55 114/61   Pulse: 97 91 74   Resp: 18 18 16    Temp: (!) 97.3 F (36.3 C) 98.3 F (36.8 C) 97.6 F (36.4 C)   TempSrc: Oral Oral Oral   SpO2: (!) 88% 96% 90%   Weight:    68.2 kg    Sensation intact distally Intact pulses distally Dorsiflexion/Plantar flexion intact Incision: well healed No cellulitis present Compartment soft Drain with minimal serosang fluid   Lab Results  Component Value Date   WBC 4.3 08/25/2022   HGB 7.5 (L) 08/25/2022   HCT 24.1 (L) 08/25/2022   MCV 101.3 (H) 08/25/2022   PLT 104 (L) 08/25/2022   BMET    Component Value Date/Time   NA 138 08/25/2022 0529   NA 141 11/06/2016 1102   K 3.5 08/25/2022 0529   K 3.9 11/06/2016 1102   CL 108 08/25/2022 0529   CO2 24 08/25/2022 0529   CO2 27 11/06/2016 1102   GLUCOSE 136 (H) 08/25/2022 0529   GLUCOSE 130 11/06/2016 1102   BUN 25 (H) 08/25/2022 0529   BUN 18.0 11/06/2016 1102   CREATININE 1.20 (H) 08/25/2022 0529   CREATININE 1.23 (H) 08/19/2022 0926   CREATININE 1.16 (H) 06/16/2022 1038   CREATININE 1.3 (H) 11/06/2016 1102   CALCIUM 8.4 (L) 08/25/2022 0529   CALCIUM 10.2 11/06/2016 1102   EGFR 46 (L) 06/16/2022 1038   GFRNONAA 45 (L) 08/25/2022 0529   GFRNONAA 43 (L) 08/19/2022 0926   GFRNONAA 34 (L) 02/21/2021 1414      Xray: X-rays and CT scan reviewed demonstrate bipolar hemiarthroplasty without adverse features.  Gluteal fluid collection on the right side  present new compared to CT scan from earlier this month.  Assessment/Plan:     Principal Problem:   Acute UTI (urinary tract infection) Active Problems:   Essential hypertension   Hyperlipidemia associated with type 2 diabetes mellitus (HCC)   DM2 (diabetes mellitus, type 2) (HCC)   Gout   Ovarian cancer ([place])   Hypomagnesemia   Stage 3b chronic kidney disease (CKD) (HCC)   Hypokalemia   Hyperbilirubinemia   Normocytic anemia   Thrombocytopenia (HCC)   Protein-calorie malnutrition, severe (HCC)   Right hip pain   History of right bipolar hemiarthroplasty for femoral neck fracture last December.  Development of new gluteal pain and swelling. Gluteal fluid collection of unknown etiology aspirated on 08/25/2022.  We will follow-up results from the aspiration.  Discussed with patient and family that if concern for infection may require irrigation and debridement.  Possible involvement of the hip joint although clinically appears asymptomatic.  Continue weightbearing as tolerated.  No restrictions.    Daxson Reffett A Darcey Cardy 08/26/2022, 7:22 AM   Charlies Constable, MD  Contact information:   [phone] 7am-5pm epic message Dr. Zachery Dakins, or call office for patient follow up: [phone] After hours and holidays please check [email or web address] for group call information for [facility]

## 2022-08-26 NOTE — Progress Notes (Signed)
Mobility Specialist - Progress Note   08/26/22 0935  Mobility  Activity Ambulated with assistance in hallway  Level of Assistance Standby assist, set-up cues, supervision of patient - no hands on  Assistive Device Front wheel walker  Distance Ambulated (ft) 500 ft  Activity Response Tolerated well  $Mobility charge 1 Mobility   Pt received in chair and agreed for mobility, no c/o pain nor discomfort during ambulation. Pt back to chair with all needs met and family in room.   Roderick Pee Mobility Specialist

## 2022-08-26 NOTE — Progress Notes (Signed)
Mobility Specialist - Progress Note   08/26/22 1233  Mobility  Activity Ambulated with assistance in hallway  Level of Assistance Contact guard assist, steadying assist  Assistive Device None;Front wheel walker  Distance Ambulated (ft) 400 ft  Activity Response Tolerated well  $Mobility charge 1 Mobility   Pt received in chair and agreed to mobilize. Some pain in hip 4/10 with walker. Half way through, tried walking with no AD. L hip pain increased to 6/10. Pt back to chair with all needs met.  Roderick Pee Mobility Specialist

## 2022-08-27 DIAGNOSIS — E785 Hyperlipidemia, unspecified: Secondary | ICD-10-CM

## 2022-08-27 DIAGNOSIS — D649 Anemia, unspecified: Secondary | ICD-10-CM

## 2022-08-27 DIAGNOSIS — E43 Unspecified severe protein-calorie malnutrition: Secondary | ICD-10-CM

## 2022-08-27 DIAGNOSIS — M1000 Idiopathic gout, unspecified site: Secondary | ICD-10-CM | POA: Diagnosis not present

## 2022-08-27 DIAGNOSIS — N1832 Chronic kidney disease, stage 3b: Secondary | ICD-10-CM

## 2022-08-27 DIAGNOSIS — I10 Essential (primary) hypertension: Secondary | ICD-10-CM

## 2022-08-27 DIAGNOSIS — D696 Thrombocytopenia, unspecified: Secondary | ICD-10-CM

## 2022-08-27 DIAGNOSIS — E1169 Type 2 diabetes mellitus with other specified complication: Secondary | ICD-10-CM

## 2022-08-27 DIAGNOSIS — D61818 Other pancytopenia: Secondary | ICD-10-CM

## 2022-08-27 DIAGNOSIS — C786 Secondary malignant neoplasm of retroperitoneum and peritoneum: Secondary | ICD-10-CM

## 2022-08-27 DIAGNOSIS — C569 Malignant neoplasm of unspecified ovary: Secondary | ICD-10-CM

## 2022-08-27 DIAGNOSIS — N390 Urinary tract infection, site not specified: Secondary | ICD-10-CM

## 2022-08-27 LAB — BASIC METABOLIC PANEL
Anion gap: 5 (ref 5–15)
BUN: 17 mg/dL (ref 8–23)
CO2: 26 mmol/L (ref 22–32)
Calcium: 8.3 mg/dL — ABNORMAL LOW (ref 8.9–10.3)
Chloride: 108 mmol/L (ref 98–111)
Creatinine, Ser: 1.58 mg/dL — ABNORMAL HIGH (ref 0.44–1.00)
GFR, Estimated: 32 mL/min — ABNORMAL LOW (ref >60)
Glucose, Bld: 95 mg/dL (ref 70–99)
Potassium: 3.6 mmol/L (ref 3.5–5.1)
Sodium: 139 mmol/L (ref 135–145)

## 2022-08-27 LAB — CBC
HCT: 25.7 % — ABNORMAL LOW (ref 36.0–46.0)
Hemoglobin: 7.8 g/dL — ABNORMAL LOW (ref 12.0–15.0)
MCH: 31.2 pg (ref 26.0–34.0)
MCHC: 30.4 g/dL (ref 30.0–36.0)
MCV: 102.8 fL — ABNORMAL HIGH (ref 80.0–100.0)
Platelets: 96 10*3/uL — ABNORMAL LOW (ref 150–400)
RBC: 2.5 MIL/uL — ABNORMAL LOW (ref 3.87–5.11)
RDW: 18.9 % — ABNORMAL HIGH (ref 11.5–15.5)
WBC: 3.9 10*3/uL — ABNORMAL LOW (ref 4.0–10.5)
nRBC: 0 % (ref 0.0–0.2)

## 2022-08-27 LAB — CYTOLOGY - NON PAP

## 2022-08-27 LAB — GLUCOSE, CAPILLARY
Glucose-Capillary: 89 mg/dL (ref 70–99)
Glucose-Capillary: 97 mg/dL (ref 70–99)
Glucose-Capillary: 106 mg/dL — ABNORMAL HIGH (ref 70–99)
Glucose-Capillary: 131 mg/dL — ABNORMAL HIGH (ref 70–99)

## 2022-08-27 MED ORDER — SODIUM CHLORIDE 0.9 % IV SOLN
3.0000 g | Freq: Two times a day (BID) | INTRAVENOUS | Status: DC
  Administered 2022-08-27 – 2022-08-29 (×3): 3 g via INTRAVENOUS
  Filled 2022-08-27 (×4): qty 8

## 2022-08-27 MED ORDER — ENSURE MAX PROTEIN PO LIQD
11.0000 [oz_av] | Freq: Two times a day (BID) | ORAL | Status: DC
  Administered 2022-08-27 – 2022-09-02 (×8): 11 [oz_av] via ORAL
  Filled 2022-08-27 (×13): qty 330

## 2022-08-27 MED ORDER — ALBUMIN HUMAN 25 % IV SOLN
25.0000 g | Freq: Once | INTRAVENOUS | Status: AC
  Administered 2022-08-27: 25 g via INTRAVENOUS
  Filled 2022-08-27: qty 100

## 2022-08-27 MED ORDER — GABAPENTIN 300 MG PO CAPS
300.0000 mg | ORAL_CAPSULE | Freq: Three times a day (TID) | ORAL | Status: DC
  Administered 2022-08-27 – 2022-09-02 (×17): 300 mg via ORAL
  Filled 2022-08-27 (×17): qty 1

## 2022-08-27 NOTE — Progress Notes (Signed)
Mobility Specialist - Progress Note   08/27/22 0852  Mobility  Activity Ambulated with assistance in hallway  Level of Assistance Modified independent, requires aide device or extra time  Assistive Device Front wheel walker  Distance Ambulated (ft) 400 ft  Activity Response Tolerated well  $Mobility charge 1 Mobility   Pt received in bed and agreed for mobility. Pain in hip, eased during ambulation. Pt to chair with all needs met.   Roderick Pee Mobility Specialist

## 2022-08-27 NOTE — Progress Notes (Signed)
PROGRESS NOTE    Kayla Baker  XBM:[id number] DOB: 08-11-1936 DOA: 08/24/2022 PCP: [name], MD    Chief Complaint  Patient presents with   Abdominal Pain    Brief Narrative:  86 year old married female with PMH of ovarian cancer with peritoneal carcinomatosis and fluid retention secondary to third spacing, anemia in neoplastic disease, CKD stage III, type II DM, GERD, gout, HLD, HTN, s/p right hip hemiarthroplasty 11/08/2021, presented to the ED on 08/22/2022 with complaints of abdominal distention, pain and pressure across her abdomen, nausea, decreased appetite and also with right hip pain with decreased mobility.  Dysuria, being treated for UTI with amoxicillin.  Admitted for acute cystitis, right hip pain with imaging showing right gluteal fluid collection.  Started on IV antibiotics.  Orthopedics consulted, who had IR aspirate and placed drain in the gluteal fluid.   Assessment & Plan:   Principal Problem:   Acute UTI (urinary tract infection) Active Problems:   Essential hypertension   Hyperlipidemia associated with type 2 diabetes mellitus (HCC)   DM2 (diabetes mellitus, type 2) (HCC)   Gout   Ovarian cancer ([place])   Hypomagnesemia   Stage 3b chronic kidney disease (CKD) (HCC)   Hypokalemia   Hyperbilirubinemia   Normocytic anemia   Thrombocytopenia (HCC)   Protein-calorie malnutrition, severe (HCC)   Right hip pain   Peritoneal carcinomatosis (HCC)   Urinary tract infection without hematuria   Pancytopenia ([place])  #1 acute UTI/acute cystitis without hematuria -Patient noted to have a urine culture from 08/05/2022 with a pansensitive E. coli. -Patient noted to have been treated appropriately in the outpatient setting with amoxicillin. -Currently on IV Unasyn, completed 3 days so far which would have covered for UTI. -IV Unasyn continued due to right gluteal fluid collection noted on CT.  2.  Right hip pain, status post previous right hip hemiarthroplasty for  fracture/right gluteal fluid collection seen on CT -Patient seen in consultation by orthopedics, IR consulted and fluid aspirated 08/25/2022 with Gram stain showing moderate WBC predominantly neutrophils, no organisms, cultures and cytology pending. -Drain currently in place with serosanguineous fluid noted. -Patient states still with pain on ambulation. -Orthopedics following and patient may require I and D if concerns for infection. -Continue IV Unasyn pending finalization of culture results.  3.  History of ovarian cancer, peritoneal carcinomatosis with ascites -HCTZ discontinued and patient started on low-dose Lasix and Aldactone. -Currently on a low-salt diet. -Fluid restriction. -Patient noted to have had paracentesis in the past however currently stable and no need for paracentesis at this time. -Patient seen by primary oncologist, Dr. [name], patient noted to be hypoalbuminemic with third spacing and due to current infection chemotherapy being postponed to next week. -It is noted that Dr. [name] has discussed extensively with patient and family multiple times at the cancer center regarding goals of care and wish to continue aggressive therapy at this time. -We will give a dose of IV albumin x1 today.  4.  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 -We will give a dose of IV albumin x1.  5.  Hypomagnesemia -Magnesium at 1.8. -Repeat labs in the AM.  6.  CKD stage IIIb -Noted to be slightly trending up, patient noted to have CT contrast 08/24/2022, Lasix and Aldactone started on 08/25/2022. -Monitor on diuretics for another 24 hours and if further worsening of renal function will hold diuretics.  7.  Hypokalemia -Pleated.  8.  Hypertension -Continue metoprolol 25 mg  daily. -HCTZ discontinued likely will not resume on discharge. -Continue low-dose Lasix and Aldactone.  9.  Hyperlipidemia -Continue  rosuvastatin 5 mg 3 times a week.  10.  Diabetes mellitus type 2 -Hemoglobin A1c 6.2 (06/16/2022). -CBG 89 this morning. -Continue to hold oral hypoglycemic agents. -Follow-up.  11.  Gout -Allopurinol.  12.  Hyperbilirubinemia -Resolved.  13.  Normocytic anemia/anemia in neoplastic disease -Patient with no overt bleeding. -Hemoglobin currently at 7.8. -Follow H&H, transfusion threshold hemoglobin < 7  14.  Pancytopenia -Likely due to recent treatment. -Transfuse for hemoglobin < 7. -Follow.  15.  Severe protein calorie malnutrition -Continue nutritional supplementation.   DVT prophylaxis: SCDs Code Status: Full Family Communication: Updated patient, daughter, sister at bedside. Disposition: TBD  Status is: Inpatient Remains inpatient appropriate because: Severity of illness.     Consultants:  Orthopedics: Dr. Mardelle Matte 08/24/2022 Oncology: Dr. [name] 08/26/2022  Procedures:  10 French drain placed in the right hip fluid collection per IR, Dr.Mir 08/25/2022 CT abdomen and pelvis 08/24/2022 Lower extremity Dopplers 08/22/2022  Antimicrobials:  IV Unasyn 08/24/2022>>>>   Subjective: Patient laying in bed.  Sister and daughter at bedside.  Denies any chest pain.  No shortness of breath.  No significant abdominal pain.  No dysuria.  Complains of right hip pain with ambulation which she states is unchanged since admission.  Objective: Vitals:   08/26/22 1412 08/26/22 2142 08/27/22 0607 08/27/22 1342  BP: (!) 142/67 (!) 121/58 128/68 132/61  Pulse: 90 95 85 85  Resp: 18 17 17 16   Temp: 98.9 F (37.2 C) 99.1 F (37.3 C) 98.4 F (36.9 C) 97.9 F (36.6 C)  TempSrc: Oral Oral Oral Oral  SpO2: 100% (!) 86% 95% 98%  Weight:   64.4 kg   Height:        Intake/Output Summary (Last 24 hours) at 08/27/2022 1733 Last data filed at 08/27/2022 1342 Gross per 24 hour  Intake 480 ml  Output --  Net 480 ml   Filed Weights   08/26/22 0700 08/27/22 0607  Weight: 68.2 kg 64.4  kg    Examination:  General exam: Appears calm and comfortable  Respiratory system: Clear to auscultation. Respiratory effort normal. Cardiovascular system: S1 & S2 heard, RRR. No JVD, murmurs, rubs, gallops or clicks.  2-3+ bilateral lower extremity edema.  Gastrointestinal system: Abdomen is mildly distended, soft, nontender to palpation, positive bowel sounds.  JP drain with serosanguineous drainage noted. Central nervous system: Alert and oriented. No focal neurological deficits. Extremities: Symmetric 5 x 5 power. Skin: No rashes, lesions or ulcers Psychiatry: Judgement and insight appear normal. Mood & affect appropriate.     Data Reviewed: I have personally reviewed following labs and imaging studies  CBC: Recent Labs  Lab 08/22/22 1247 08/24/22 1029 08/25/22 0529 08/26/22 1258 08/27/22 0706  WBC 4.6 6.2 4.3 6.6 3.9*  NEUTROABS  --  3.8  --   --   --   HGB 8.1* 9.1* 7.5* 9.3* 7.8*  HCT 25.5* 27.9* 24.1* 29.7* 25.7*  MCV 98.8 98.2 101.3* 101.4* 102.8*  PLT 126* 145* 104* 142* 96*    Basic Metabolic Panel: Recent Labs  Lab 08/22/22 1247 08/24/22 1029 08/25/22 0529 08/25/22 0644 08/26/22 1258 08/27/22 0706  NA 138 137 138  --  137 139  K 3.5 3.4* 3.5  --  4.3 3.6  CL 108 105 108  --  106 108  CO2 24 23 24   --  23 26  GLUCOSE 124* 95 136*  --  108*  95  BUN 39* 30* 25*  --  18 17  CREATININE 1.11* 1.14* 1.20*  --  1.39* 1.58*  CALCIUM 9.0 8.9 8.4*  --  8.9 8.3*  MG  --  1.2*  --  1.8  --   --     GFR: Estimated Creatinine Clearance: 22.9 mL/min (A) (by C-G formula based on SCr of 1.58 mg/dL (H)).  Liver Function Tests: Recent Labs  Lab 08/22/22 1539 08/24/22 1029 08/25/22 0529 08/26/22 1258  AST 57* 42* 38 67*  ALT 34 26 23 33  ALKPHOS 107 97 85 137*  BILITOT 0.8 1.3* 0.8 0.9  PROT 5.9* 6.4* 5.2* 6.7  ALBUMIN 2.1* 2.3* 1.9* 2.4*    CBG: Recent Labs  Lab 08/26/22 1634 08/26/22 2144 08/27/22 0743 08/27/22 1146 08/27/22 1627  GLUCAP 84  118* 89 97 106*     Recent Results (from the past 240 hour(s))  Aerobic/Anaerobic Culture w Gram Stain (surgical/deep wound)     Status: None (Preliminary result)   Collection Time: 08/25/22 12:36 PM   Specimen: Abscess  Result Value Ref Range Status   Specimen Description   Final    ABSCESS RIGHT HIP Performed at [facility] [address] [place], [place] [address]    Special Requests   Final    NONE Performed at [facility], [place] [address] [place], [place] [address]    Gram Stain   Final    MODERATE WBC PRESENT, PREDOMINANTLY PMN NO ORGANISMS SEEN Performed at [facility], [place] [address] [place], [place] [address]    Culture   Final    RARE NORMAL SKIN FLORA NO ANAEROBES ISOLATED; CULTURE IN PROGRESS FOR 5 DAYS    Report Status PENDING  Incomplete         Radiology Studies: No results found.      Scheduled Meds:  allopurinol  100 mg Oral Daily   ezetimibe  10 mg Oral Daily   furosemide  20 mg Oral Daily   gabapentin  300 mg Oral TID   magnesium oxide  400 mg Oral Daily   metoprolol succinate  25 mg Oral Daily   polyethylene glycol  17 g Oral BID   Ensure Max Protein  11 oz Oral BID   rosuvastatin  5 mg Oral Q M,W,F   spironolactone  50 mg Oral Daily   Continuous Infusions:  ampicillin-sulbactam (UNASYN) IV       LOS: 3 days    Time spent: 40 minutes    Irine Seal, MD [facility]   To contact the attending provider between 7A-7P or the covering provider during after hours 7P-7A, please log into the web site [email or web address] and access using universal [place] password for that web site. If you do not have the password, please call the hospital operator.  08/27/2022, 5:34 PM

## 2022-08-27 NOTE — Progress Notes (Signed)
Subjective: Patient reports that she is doing okay today.  Still having some soreness primarily in the in the gluteal and hip region.  Denies distal numbness tingling.  No new acute issues.    Objective:   VITALS:   Vitals:   08/26/22 1028 08/26/22 1412 08/26/22 2142 08/27/22 0607  BP:  (!) 142/67 (!) 121/58 128/68  Pulse:  90 95 85  Resp:  18 17 17   Temp:  98.9 F (37.2 C) 99.1 F (37.3 C) 98.4 F (36.9 C)  TempSrc:  Oral Oral Oral  SpO2:  100% (!) 86% 95%  Weight:    64.4 kg  Height: 5' 4"  (1.626 m)       Sensation intact distally Intact pulses distally Dorsiflexion/Plantar flexion intact Incision: well healed No cellulitis present Compartment soft Drain with minimal serosang fluid   Lab Results  Component Value Date   WBC 3.9 (L) 08/27/2022   HGB 7.8 (L) 08/27/2022   HCT 25.7 (L) 08/27/2022   MCV 102.8 (H) 08/27/2022   PLT 96 (L) 08/27/2022   BMET    Component Value Date/Time   NA 139 08/27/2022 0706   NA 141 11/06/2016 1102   K 3.6 08/27/2022 0706   K 3.9 11/06/2016 1102   CL 108 08/27/2022 0706   CO2 26 08/27/2022 0706   CO2 27 11/06/2016 1102   GLUCOSE 95 08/27/2022 0706   GLUCOSE 130 11/06/2016 1102   BUN 17 08/27/2022 0706   BUN 18.0 11/06/2016 1102   CREATININE 1.58 (H) 08/27/2022 0706   CREATININE 1.23 (H) 08/19/2022 0926   CREATININE 1.16 (H) 06/16/2022 1038   CREATININE 1.3 (H) 11/06/2016 1102   CALCIUM 8.3 (L) 08/27/2022 0706   CALCIUM 10.2 11/06/2016 1102   EGFR 46 (L) 06/16/2022 1038   GFRNONAA 32 (L) 08/27/2022 0706   GFRNONAA 43 (L) 08/19/2022 0926   GFRNONAA 34 (L) 02/21/2021 1414      Xray: X-rays and CT scan reviewed demonstrate bipolar hemiarthroplasty without adverse features.  Gluteal fluid collection on the right side present new compared to CT scan from earlier this month.  Assessment/Plan:     Principal Problem:   Acute UTI (urinary tract infection) Active Problems:   Essential hypertension   Hyperlipidemia  associated with type 2 diabetes mellitus (HCC)   DM2 (diabetes mellitus, type 2) (HCC)   Gout   Ovarian cancer ([place])   Hypomagnesemia   Stage 3b chronic kidney disease (CKD) (HCC)   Hypokalemia   Hyperbilirubinemia   Normocytic anemia   Thrombocytopenia (HCC)   Protein-calorie malnutrition, severe (HCC)   Right hip pain   History of right bipolar hemiarthroplasty for femoral neck fracture last December.  Development of new gluteal pain and swelling. Gluteal fluid collection of unknown etiology aspirated on 08/25/2022.  Cultures from aspiration continue to show no growth to date.  Fluid in JP drain appears serosanguineous and benign.  Patient still has some soreness but functionally doing very well ambulating 400 to 500 feet.  Would recommend waiting until 48 to 72-hour culture results before discharge.  We will follow-up results from the aspiration.  Discussed with patient and family that if concern for infection may require irrigation and debridement.  Possible involvement of the hip joint although clinically appears asymptomatic.  Continue weightbearing as tolerated.  No restrictions.    Kayla Baker A Minela [place] 08/27/2022, 10:18 AM   Charlies Constable, MD  Contact information:   [id number] 7am-5pm epic message Dr. Zachery Dakins, or call office for patient follow up: [phone] After hours and holidays please check [email or web address] for group call information for [facility]

## 2022-08-27 NOTE — Progress Notes (Signed)
Mobility Specialist - Progress Note   08/27/22 1237  Mobility  Activity Ambulated with assistance in hallway  Level of Assistance Modified independent, requires aide device or extra time  Assistive Device Front wheel walker  Distance Ambulated (ft) 450 ft  Activity Response Tolerated well  $Mobility charge 1 Mobility   Pt received in bed and agreed for mobility, pain in hip still present. Pt back to bed with all needs met and family in room.   Roderick Pee Mobility Specialist

## 2022-08-27 NOTE — Progress Notes (Signed)
Initial Nutrition Assessment  DOCUMENTATION CODES:   Non-severe (moderate) malnutrition in context of chronic illness  INTERVENTION:   -Ensure MAX Protein po BID, each supplement provides 150 kcal and 30 grams of protein   -Changed diet to 2gmNa diet with 1.5L fluid restriction to help expand menu options  NUTRITION DIAGNOSIS:   Moderate Malnutrition related to chronic illness, cancer and cancer related treatments as evidenced by mild fat depletion, mild muscle depletion.  GOAL:   Patient will meet greater than or equal to 90% of their needs  MONITOR:   PO intake, Supplement acceptance, Labs, Weight trends, I & O's  REASON FOR ASSESSMENT:   Consult Assessment of nutrition requirement/status  ASSESSMENT:   86 year old married female with PMH of ovarian cancer with peritoneal carcinomatosis and fluid retention secondary to third spacing, anemia in neoplastic disease, CKD stage III, type II DM, GERD, gout, HLD, HTN, s/p right hip hemiarthroplasty 11/08/2021, presented to the ED on 08/22/2022 with complaints of abdominal distention, pain and pressure across her abdomen, nausea, decreased appetite and also with right hip pain with decreased mobility.  Dysuria, being treated for UTI with amoxicillin.  Admitted for acute cystitis, right hip pain with imaging showing right gluteal fluid collection.  9/25: s/p drain placement in IR  Patient in room with family at bedside. Pt sitting in chair, states she still has pain in her hip but she is still walking around as much as she can. Pt states she is eating her meals, her taste has changes since her chemo treatments. Pt asks why she is on a restrictive diet. Manages her blood sugars at home. Will change from [place] mod to 2gmna diet.  She is currently consuming 100% of her meals. Drinks Community education officer at home, will order [employer].  Pt reports she takes Vitamin C, D and B-12 at home.   Per patient, her UBW is ~140 lbs. Weight has  increased d/t fluid. Current weight: 150 lbs.  Medications: Lasix, MAG-OX, Miralax  Labs reviewed: CBGs: 84-118   NUTRITION - FOCUSED PHYSICAL EXAM:  Flowsheet Row Most Recent Value  Orbital Region Mild depletion  Upper Arm Region Mild depletion  Thoracic and Lumbar Region No depletion  Buccal Region Mild depletion  Temple Region Mild depletion  Clavicle Bone Region Mild depletion  Clavicle and Acromion Bone Region Mild depletion  Scapular Bone Region Mild depletion  Dorsal Hand Moderate depletion  Patellar Region Mild depletion  Anterior Thigh Region Mild depletion  Posterior Calf Region Mild depletion  Edema (RD Assessment) None  Hair Reviewed  [thin]  Eyes Reviewed  Mouth Reviewed  Skin Reviewed  Nails Reviewed       Diet Order:   Diet Order             Diet 2 gram sodium Room service appropriate? Yes; Fluid consistency: Thin; Fluid restriction: 1500 mL Fluid  Diet effective now                   EDUCATION NEEDS:   Education needs have been addressed  Skin:  Skin Assessment: Reviewed RN Assessment  Last BM:  9/26  Height:   Ht Readings from Last 1 Encounters:  08/26/22 5' 4"  (1.626 m)    Weight:   Wt Readings from Last 1 Encounters:  08/27/22 64.4 kg    BMI:  Body mass index is 24.37 kg/m.  Estimated Nutritional Needs:   Kcal:  1900-2100  Protein:  90-100g  Fluid:  2L/day  Clayton Bibles, MS, RD,  LDN Inpatient Clinical Dietitian Contact information available via Amion

## 2022-08-28 DIAGNOSIS — T8453XA Infection and inflammatory reaction due to internal right knee prosthesis, initial encounter: Secondary | ICD-10-CM

## 2022-08-28 DIAGNOSIS — N390 Urinary tract infection, site not specified: Secondary | ICD-10-CM | POA: Diagnosis not present

## 2022-08-28 DIAGNOSIS — E440 Moderate protein-calorie malnutrition: Secondary | ICD-10-CM | POA: Insufficient documentation

## 2022-08-28 DIAGNOSIS — Z96649 Presence of unspecified artificial hip joint: Secondary | ICD-10-CM

## 2022-08-28 DIAGNOSIS — M1000 Idiopathic gout, unspecified site: Secondary | ICD-10-CM | POA: Diagnosis not present

## 2022-08-28 DIAGNOSIS — E1122 Type 2 diabetes mellitus with diabetic chronic kidney disease: Secondary | ICD-10-CM

## 2022-08-28 DIAGNOSIS — I10 Essential (primary) hypertension: Secondary | ICD-10-CM | POA: Diagnosis not present

## 2022-08-28 DIAGNOSIS — Z881 Allergy status to other antibiotic agents status: Secondary | ICD-10-CM

## 2022-08-28 DIAGNOSIS — L02419 Cutaneous abscess of limb, unspecified: Secondary | ICD-10-CM | POA: Diagnosis not present

## 2022-08-28 LAB — CBC WITH DIFFERENTIAL/PLATELET
Abs Immature Granulocytes: 0.06 10*3/uL (ref 0.00–0.07)
Basophils Absolute: 0 10*3/uL (ref 0.0–0.1)
Basophils Relative: 1 %
Eosinophils Absolute: 0.1 10*3/uL (ref 0.0–0.5)
Eosinophils Relative: 4 %
HCT: 22.5 % — ABNORMAL LOW (ref 36.0–46.0)
Hemoglobin: 7 g/dL — ABNORMAL LOW (ref 12.0–15.0)
Immature Granulocytes: 2 %
Lymphocytes Relative: 30 %
Lymphs Abs: 1.1 10*3/uL (ref 0.7–4.0)
MCH: 31.1 pg (ref 26.0–34.0)
MCHC: 31.1 g/dL (ref 30.0–36.0)
MCV: 100 fL (ref 80.0–100.0)
Monocytes Absolute: 0.4 10*3/uL (ref 0.1–1.0)
Monocytes Relative: 11 %
Neutro Abs: 1.9 10*3/uL (ref 1.7–7.7)
Neutrophils Relative %: 52 %
Platelets: 89 10*3/uL — ABNORMAL LOW (ref 150–400)
RBC: 2.25 MIL/uL — ABNORMAL LOW (ref 3.87–5.11)
RDW: 18.5 % — ABNORMAL HIGH (ref 11.5–15.5)
WBC: 3.5 10*3/uL — ABNORMAL LOW (ref 4.0–10.5)
nRBC: 0 % (ref 0.0–0.2)

## 2022-08-28 LAB — BASIC METABOLIC PANEL
Anion gap: 6 (ref 5–15)
BUN: 20 mg/dL (ref 8–23)
CO2: 24 mmol/L (ref 22–32)
Calcium: 8.7 mg/dL — ABNORMAL LOW (ref 8.9–10.3)
Chloride: 108 mmol/L (ref 98–111)
Creatinine, Ser: 1.73 mg/dL — ABNORMAL HIGH (ref 0.44–1.00)
GFR, Estimated: 29 mL/min — ABNORMAL LOW (ref >60)
Glucose, Bld: 90 mg/dL (ref 70–99)
Potassium: 3.5 mmol/L (ref 3.5–5.1)
Sodium: 138 mmol/L (ref 135–145)

## 2022-08-28 LAB — HEMOGLOBIN AND HEMATOCRIT, BLOOD
HCT: 26.8 % — ABNORMAL LOW (ref 36.0–46.0)
Hemoglobin: 8.4 g/dL — ABNORMAL LOW (ref 12.0–15.0)

## 2022-08-28 LAB — GLUCOSE, CAPILLARY
Glucose-Capillary: 91 mg/dL (ref 70–99)
Glucose-Capillary: 106 mg/dL — ABNORMAL HIGH (ref 70–99)
Glucose-Capillary: 112 mg/dL — ABNORMAL HIGH (ref 70–99)
Glucose-Capillary: 165 mg/dL — ABNORMAL HIGH (ref 70–99)

## 2022-08-28 LAB — MAGNESIUM: Magnesium: 1.3 mg/dL — ABNORMAL LOW (ref 1.7–2.4)

## 2022-08-28 MED ORDER — EPINEPHRINE 0.3 MG/0.3ML IJ SOAJ: 0.3000 mg | Freq: Once | INTRAMUSCULAR | Status: DC | PRN

## 2022-08-28 MED ORDER — SODIUM CHLORIDE 0.9% FLUSH
10.0000 mL | Freq: Two times a day (BID) | INTRAVENOUS | Status: DC
  Administered 2022-08-29 – 2022-09-02 (×7): 10 mL

## 2022-08-28 MED ORDER — POTASSIUM CHLORIDE CRYS ER 20 MEQ PO TBCR
20.0000 meq | EXTENDED_RELEASE_TABLET | Freq: Once | ORAL | Status: AC
  Administered 2022-08-28: 20 meq via ORAL
  Filled 2022-08-28: qty 1

## 2022-08-28 MED ORDER — MAGNESIUM SULFATE 4 GM/100ML IV SOLN
4.0000 g | Freq: Once | INTRAVENOUS | Status: AC
  Administered 2022-08-28: 4 g via INTRAVENOUS
  Filled 2022-08-28: qty 100

## 2022-08-28 MED ORDER — DIPHENHYDRAMINE HCL 50 MG/ML IJ SOLN: 25.0000 mg | Freq: Once | INTRAMUSCULAR | Status: DC | PRN

## 2022-08-28 MED ORDER — CEPHALEXIN 500 MG PO CAPS
500.0000 mg | ORAL_CAPSULE | Freq: Once | ORAL | Status: AC
  Administered 2022-08-28: 500 mg via ORAL
  Filled 2022-08-28: qty 1

## 2022-08-28 MED ORDER — SODIUM CHLORIDE 0.9% FLUSH: 10.0000 mL | INTRAVENOUS | Status: DC | PRN

## 2022-08-28 MED ORDER — CHLORHEXIDINE GLUCONATE CLOTH 2 % EX PADS
6.0000 | MEDICATED_PAD | Freq: Every day | CUTANEOUS | Status: DC
  Administered 2022-08-29 – 2022-09-02 (×5): 6 via TOPICAL

## 2022-08-28 NOTE — Care Management Important Message (Signed)
Important Message  Patient Details IM Letter given to the Patient. Name: Kayla Baker MRN: [id number] Date of Birth: 03-05-1936   Medicare Important Message Given:  Yes     Kerin Salen 08/28/2022, 10:18 AM

## 2022-08-28 NOTE — Progress Notes (Signed)
Mobility Specialist - Progress Note   08/28/22 1300  Mobility  Activity Ambulated with assistance in hallway  Level of Assistance Independent after set-up  Assistive Device Front wheel walker  Distance Ambulated (ft) 350 ft  Activity Response Tolerated well  $Mobility charge 1 Mobility   Pt received in chair and agreed to mobilize. Some discomfort in hip, very little pain. Pt back to bed with all needs met.   Roderick Pee Mobility Specialist

## 2022-08-28 NOTE — Progress Notes (Signed)
Cultures from patient's right hip gluteal fluid collection aspiration returned positive for skin flora.  Discussed her case both with Dr. Grandville Silos and Dr. Drucilla Schmidt of infectious disease.  Given now positive culture, proximity of the gluteal abscess to the hip joint recommend surgical irrigation and debridement of both the abscess and the hip joint with exchange of modular components.  We will send tissue cultures intraoperatively.  Plan for suppression postoperatively with antibiotics.  Patient's hemoglobin low in the eights will likely require transfusion perioperatively.  Risk and benefits of surgery were discussed with the patient over the phone.  She expresses understanding and agreement with plan to proceed with surgery.

## 2022-08-28 NOTE — Progress Notes (Signed)
Mobility Specialist - Progress Note   08/28/22 0853  Mobility  Activity Ambulated with assistance in hallway  Level of Assistance Standby assist, set-up cues, supervision of patient - no hands on  Assistive Device Front wheel walker  Distance Ambulated (ft) 1200 ft  Activity Response Tolerated well  $Mobility charge 1 Mobility   Pt received in chair and agreed for mobility, some pain in hip, not as bad as yesterday. Pt returned to chair with all needs met and family in room.    Roderick Pee Mobility Specialist

## 2022-08-28 NOTE — Progress Notes (Signed)
Cephalosporin Allergy Clarification: Oral Cephalosporin Challenge   History of allergy: Patient reports allergy occurred > 30 years ago, she was an adult when this happened, does not remember exactly what the reaction was but she denies the need for emergency services or hospitalization. Since then she has received other penicillin products with no reaction (pipercillin, amoxicillin, ampicillin)    Type of intervention:  Cephalexin Oral Challenge - patient and husband agreeable to proceed. Planning oral cephalexin 500 mg po x 1. Monitoring and plan discussed with the patient's RN   Impact on therapy: Patient tolerated cephalexin with no reaction per nurse report. PRN epinephrine and benadryl not utilized, BP remained stable for 1 hour after administration.    Plan - Administered cephalexin 562m daily - Removed allergy from patient chart. She may receive cephalosporins moving forward with no concerns for allergic reaction.  - Notified PCP and primary pharmacy of allergy testing results   Thank you for allowing pharmacy to be a part of this patient's care.   KTitus Dubin PharmD PGY1 Pharmacy Resident 08/28/2022 2:04 PM

## 2022-08-28 NOTE — Progress Notes (Signed)
PROGRESS NOTE    Kayla Baker  SWF:[id number] DOB: 12/29/1935 DOA: 08/24/2022 PCP: [name], MD    Chief Complaint  Patient presents with   Abdominal Pain    Brief Narrative:  86 year old married female with PMH of ovarian cancer with peritoneal carcinomatosis and fluid retention secondary to third spacing, anemia in neoplastic disease, CKD stage III, type II DM, GERD, gout, HLD, HTN, s/p right hip hemiarthroplasty 11/08/2021, presented to the ED on 08/22/2022 with complaints of abdominal distention, pain and pressure across her abdomen, nausea, decreased appetite and also with right hip pain with decreased mobility.  Dysuria, being treated for UTI with amoxicillin.  Admitted for acute cystitis, right hip pain with imaging showing right gluteal fluid collection.  Started on IV antibiotics.  Orthopedics consulted, who had IR aspirate and placed drain in the gluteal fluid.   Assessment & Plan:   Principal Problem:   Abscess of hip Active Problems:   Essential hypertension   Hyperlipidemia associated with type 2 diabetes mellitus (HCC)   DM2 (diabetes mellitus, type 2) (HCC)   Gout   Ovarian cancer ([place])   Hypomagnesemia   Stage 3b chronic kidney disease (CKD) (HCC)   Acute UTI (urinary tract infection)   Hypokalemia   Hyperbilirubinemia   Normocytic anemia   Thrombocytopenia (HCC)   Protein-calorie malnutrition, severe (HCC)   Right hip pain   Peritoneal carcinomatosis (HCC)   Urinary tract infection without hematuria   Pancytopenia (HCC)   Malnutrition of moderate degree   Prosthetic hip infection ([place])  #1 acute UTI/acute cystitis without hematuria -Patient noted to have a urine culture from 08/05/2022 with a pansensitive E. coli. -Patient noted to have been treated appropriately in the outpatient setting with amoxicillin. -Currently on IV Unasyn, completed 3 days so far which would have covered for UTI. -IV Unasyn continued due to right gluteal fluid collection noted  on CT.  2.  Right hip pain, status post previous right hip hemiarthroplasty for fracture/right gluteal fluid collection seen on CT/?  Prosthetic hip infection -Patient seen in consultation by orthopedics, IR consulted and fluid aspirated 08/25/2022 with Gram stain showing moderate WBC predominantly neutrophils, no organisms, cultures and cytology pending. -Drain currently in place with serosanguineous fluid noted. -Patient states still with pain on ambulation. -Currently on IV Unasyn with preliminary cultures pending with rare Staphylococcus Caprae. -ID consulted, patient seen in consultation by Dr. Tommy Medal this morning 08/28/2022 who reviewed imaging, assessed patient and concern for right-sided prosthetic hip infection. -Dr. Tommy Medal relayed his thoughts to orthopedics, Dr.Marchwiany and patient to be taken to the OR tomorrow for I&D and intraoperative cultures. -ID currently recommending probable 6 weeks of targeted IV antibiotics followed by oral antibiotics for total of 6 months versus a year. -Continue IV Unasyn. -ID, orthopedics following and appreciate input and recommendations.  3.  History of ovarian cancer, peritoneal carcinomatosis with ascites -HCTZ discontinued and patient started on low-dose Lasix and Aldactone. -Currently on a low-salt diet. -Fluid restriction. -Patient noted to have had paracentesis in the past however currently stable and no need for paracentesis at this time. -Patient seen by primary oncologist, Dr. [name], patient noted to be hypoalbuminemic with third spacing and due to current infection chemotherapy being postponed to next week. -It is noted that Dr. [name] has discussed extensively with patient and family multiple times at the cancer center regarding goals of care and wish to continue aggressive therapy at this time. -Status post IV albumin x1 08/27/2022.   -Patient with bump  in creatinine and as such we will hold Lasix and Aldactone.    4.  Hepatic  cirrhosis with portal hypertension and ascites -Currently on Lasix and Aldactone, monitor renal function. -Status post IV albumin x1. -Patient with bump in creatinine and as such we will hold Aldactone and Lasix.  5.  Hypomagnesemia -Magnesium at 1.3. -Magnesium sulfate 4 g IV x1. -Repeat labs in the AM.  6.  CKD stage IIIb -Noted to be slightly trending up, patient noted to have CT contrast 08/24/2022, Lasix and Aldactone started on 08/25/2022. -We will hold Lasix and Aldactone as further bump in creatinine currently at 1.73.  7.  Hypokalemia -Repleted.  Potassium at 3.5 this morning. -Magnesium at 1.3. -Magnesium sulfate 4 g IV x1. -Repeat labs in the AM.  8.  Hypertension -Continue metoprolol 25 mg daily. -HCTZ discontinued likely will not resume on discharge. -Hold low-dose Aldactone and Lasix due to bump in creatinine.  9.  Hyperlipidemia -Continue rosuvastatin 5 mg 3 times a week.  10.  Diabetes mellitus type 2 -Hemoglobin A1c 6.2 (06/16/2022). -CBG 91 this morning. -Continue to hold oral hypoglycemic agents. -Follow-up.  11.  Gout -Continue allopurinol.    12.  Hyperbilirubinemia -Resolved.  13.  Normocytic anemia/anemia in neoplastic disease -Patient with no overt bleeding. -Hemoglobin currently at 7. -Repeat H&H this afternoon. -Follow H&H, transfusion threshold hemoglobin < 7  14.  Pancytopenia -Likely due to recent treatment. -Hemoglobin at 7 this morning, repeat H&H this afternoon. -Transfuse for hemoglobin < 7. -Follow.  15.  Severe protein calorie malnutrition -Continue nutritional supplementation.   DVT prophylaxis: SCDs Code Status: Full Family Communication: Updated patient, husband at bedside.  Disposition: TBD  Status is: Inpatient Remains inpatient appropriate because: Severity of illness.     Consultants:  Orthopedics: Dr. Mardelle Matte 08/24/2022 Oncology: Dr. [name] 08/26/2022 ID: Dr. Wendie Agreste 08/28/2022  Procedures:  10 French drain  placed in the right hip fluid collection per IR, Dr.Mir 08/25/2022 CT abdomen and pelvis 08/24/2022 Lower extremity Dopplers 08/22/2022  Antimicrobials:  IV Unasyn 08/24/2022>>>>   Subjective: Sitting up in chair.  No chest pain.  No shortness of breath.  No abdominal pain.  Still with complaints of right hip pain on ambulation which she states has not improved since admission.  Husband at bedside.    Objective: Vitals:   08/27/22 1342 08/27/22 2056 08/28/22 0540 08/28/22 0610  BP: 132/61 119/61 130/64   Pulse: 85 90 83 85  Resp: 16 20 17    Temp: 97.9 F (36.6 C) 99 F (37.2 C) 98.2 F (36.8 C)   TempSrc: Oral Oral Oral   SpO2: 98% 93% (!) 85% 97%  Weight:      Height:        Intake/Output Summary (Last 24 hours) at 08/28/2022 1548 Last data filed at 08/28/2022 1020 Gross per 24 hour  Intake 360 ml  Output --  Net 360 ml   Filed Weights   08/26/22 0700 08/27/22 0607  Weight: 68.2 kg 64.4 kg    Examination:  General exam: NAD. Respiratory system: CTA B.  No wheezes, no crackles, no rhonchi.  Fair air movement.  Speaking in full sentences.  Cardiovascular system: RRR no murmurs rubs or gallops.  No JVD.  2+ bilateral lower extremity edema.  Gastrointestinal system: Abdomen is mildly distended, soft, nontender to palpation, positive bowel sounds.  JP drain with serosanguineous drainage noted. Central nervous system: Alert and oriented. No focal neurological deficits. Extremities: Symmetric 5 x 5 power. Skin: No rashes, lesions  or ulcers Psychiatry: Judgement and insight appear normal. Mood & affect appropriate.     Data Reviewed: I have personally reviewed following labs and imaging studies  CBC: Recent Labs  Lab 08/24/22 1029 08/25/22 0529 08/26/22 1258 08/27/22 0706 08/28/22 0602 08/28/22 1339  WBC 6.2 4.3 6.6 3.9* 3.5*  --   NEUTROABS 3.8  --   --   --  1.9  --   HGB 9.1* 7.5* 9.3* 7.8* 7.0* 8.4*  HCT 27.9* 24.1* 29.7* 25.7* 22.5* 26.8*  MCV 98.2 101.3*  101.4* 102.8* 100.0  --   PLT 145* 104* 142* 96* 89*  --     Basic Metabolic Panel: Recent Labs  Lab 08/24/22 1029 08/25/22 0529 08/25/22 0644 08/26/22 1258 08/27/22 0706 08/28/22 0602  NA 137 138  --  137 139 138  K 3.4* 3.5  --  4.3 3.6 3.5  CL 105 108  --  106 108 108  CO2 23 24  --  23 26 24   GLUCOSE 95 136*  --  108* 95 90  BUN 30* 25*  --  18 17 20   CREATININE 1.14* 1.20*  --  1.39* 1.58* 1.73*  CALCIUM 8.9 8.4*  --  8.9 8.3* 8.7*  MG 1.2*  --  1.8  --   --  1.3*    GFR: Estimated Creatinine Clearance: 20.9 mL/min (A) (by C-G formula based on SCr of 1.73 mg/dL (H)).  Liver Function Tests: Recent Labs  Lab 08/22/22 1539 08/24/22 1029 08/25/22 0529 08/26/22 1258  AST 57* 42* 38 67*  ALT 34 26 23 33  ALKPHOS 107 97 85 137*  BILITOT 0.8 1.3* 0.8 0.9  PROT 5.9* 6.4* 5.2* 6.7  ALBUMIN 2.1* 2.3* 1.9* 2.4*    CBG: Recent Labs  Lab 08/27/22 1146 08/27/22 1627 08/27/22 2120 08/28/22 0745 08/28/22 1149  GLUCAP 97 106* 131* 91 106*     Recent Results (from the past 240 hour(s))  Aerobic/Anaerobic Culture w Gram Stain (surgical/deep wound)     Status: None (Preliminary result)   Collection Time: 08/25/22 12:36 PM   Specimen: Abscess  Result Value Ref Range Status   Specimen Description   Final    ABSCESS RIGHT HIP Performed at [facility] [address] [place], [place] [address]    Special Requests   Final    NONE Performed at [facility], [place] [address], [place], [place] [address]    Gram Stain   Final    MODERATE WBC PRESENT, PREDOMINANTLY PMN NO ORGANISMS SEEN Performed at [facility], [place] [address], [place], [place] [address]    Culture   Final    RARE STAPHYLOCOCCUS CAPRAE NO ANAEROBES ISOLATED; CULTURE IN PROGRESS FOR 5 DAYS    Report Status PENDING  Incomplete         Radiology Studies: No results found.      Scheduled Meds:  allopurinol  100 mg Oral Daily   ezetimibe  10 mg  Oral Daily   gabapentin  300 mg Oral TID   magnesium oxide  400 mg Oral Daily   metoprolol succinate  25 mg Oral Daily   polyethylene glycol  17 g Oral BID   Ensure Max Protein  11 oz Oral BID   rosuvastatin  5 mg Oral Q M,W,F   Continuous Infusions:  ampicillin-sulbactam (UNASYN) IV 3 g (08/27/22 2010)   magnesium sulfate bolus IVPB       LOS: 4 days    Time spent:  40 minutes    Irine Seal, MD [facility]   To contact the attending provider between 7A-7P or the covering provider during after hours 7P-7A, please log into the web site [email or web address] and access using universal  password for that web site. If you do not have the password, please call the hospital operator.  08/28/2022, 3:48 PM

## 2022-08-28 NOTE — Consult Note (Signed)
Date of Admission:  08/24/2022          Reason for Consult: Right Prosthetic hip infection   Referring Provider: Irine Seal, MD and Charlies Constable, MD   Assessment:  Right sided prosthetic hip infection Ovarian cancer with peritoneal carcinomatosis receiving chemotherapy and radiation therapy She had breast cancer Diabetes mellitus type 2 Gout Gastroesophageal reflux disease Stage III chronic kidney disease Allergy to Keflex  Plan:  I have discussed the case with Drs. Grandville Silos and Gideon, and Dr. Zachery Dakins, is planning on taking the into the operating room tomorrow for I&D and poly liner exchange with operative cultures to be taken. We will continue Unasyn for now We will follow up on intraoperative cultures as well as the cultures that were taken from the aspirate of the fluid collection We have given Keflex orally to see if she still has allergic reaction to cephalosporins and if not we will be able to use cephalosporins in the future with her. Plan on giving her 6 weeks of what we hope to be targeted IV antibiotics followed by oral antibiotics for total of 6 months vs a year   Ira M Dupin has an appointment on 09/23/2022 at 245 PM with Dr. Tommy Medal  at:  [facility], which  is located in the [facility] at  [address] in [place].  [address], which is located to the left of the elevators.  Phone: [phone]  Fax: [phone]  [email or web address]  The patient should arrive 30 minutes prior to their appoitment.    Principal Problem:   Abscess of hip Active Problems:   Essential hypertension   Hyperlipidemia associated with type 2 diabetes mellitus (HCC)   DM2 (diabetes mellitus, type 2) (HCC)   Gout   Ovarian cancer (HCC)   Hypomagnesemia   Stage 3b chronic kidney disease (CKD) (HCC)   Acute UTI (urinary tract infection)   Hypokalemia   Hyperbilirubinemia    Normocytic anemia   Thrombocytopenia (HCC)   Protein-calorie malnutrition, severe (HCC)   Right hip pain   Peritoneal carcinomatosis (HCC)   Urinary tract infection without hematuria   Pancytopenia (HCC)   Malnutrition of moderate degree   Scheduled Meds:  allopurinol  100 mg Oral Daily   ezetimibe  10 mg Oral Daily   gabapentin  300 mg Oral TID   magnesium oxide  400 mg Oral Daily   metoprolol succinate  25 mg Oral Daily   polyethylene glycol  17 g Oral BID   Ensure Max Protein  11 oz Oral BID   rosuvastatin  5 mg Oral Q M,W,F   Continuous Infusions:  ampicillin-sulbactam (UNASYN) IV 3 g (08/27/22 2010)   magnesium sulfate bolus IVPB     PRN Meds:.acetaminophen **OR** acetaminophen, diphenhydrAMINE, EPINEPHrine, ondansetron **OR** ondansetron (ZOFRAN) IV, oxyCODONE  HPI: ANIKAH HOGGE is a 86 y.o. female with hx of HTN, hyperlipidemia, breast cancer, cephalosporin allergy and recent ovarian cancer with carcinomatosis who presented to the emergency department with abdominal pain due to her worsening ascites as well as nausea and reduced appetite and right hip pain with reduced mobility.  In the emerged department on 22 September and discharged.  A CT of the abdomen pelvis that was performed during that admission showed that it is of inflammation in the bladder as well as moderate volume ascites well is a small amount of fluid adjacent the right hip and gluteal muscles which are with some peripheral  enhancement that was 6.1 x 1.8 cm  Turn to the hospital 2 days later having felt no better.   Again showed peritoneal pneumatosis with moderate ascites that had not changed diverticulosis and hepatic cirrhosis and fluid collection with thin rim enhancement that was 6.2 x 2.2 cm is not changed compared to study in September 22.  He was started on IV Unasyn.  An aspirate from the fluid collection was obtained on 25 September but was initially reported by the microbiology lab is showing  "normal skin flora".  I do not think the microbiology lab initially understood that this was a deep specimen that we really need worked up for culture.  The fluid collection does communicate with her prosthetic hip and I am concerned that the hip itself is infected.  I have communicated this with Dr. Grandville Silos and Dr. Zachery Dakins, and patient is going for I and D's change tomorrow.  Hopefully can get some deep cultures in the operating room we have also asked the microbiology lab to work-up the cultures that were taken via interventional radiology.  I will plan on giving her 6 weeks of parenteral antibiotics followed by protracted oral antibiotics at minimum for 6 months but likely longer.  I spent 84 minutes with the patient including than 50% of the time in face to face counseling of the patient regarding the nature of prosthetic hip infections, personally reviewing the abdomen pelvis performed September 22 24th 2023 along with review of medical records in preparation for the visit and during the visit and in coordination of her care with  Drs. Grandville Silos and [employer],   Review of Systems: Review of Systems  Constitutional:  Negative for chills, diaphoresis, fever, malaise/fatigue and weight loss.  HENT:  Negative for congestion, hearing loss, sore throat and tinnitus.   Eyes:  Negative for blurred vision and double vision.  Respiratory:  Negative for cough, sputum production, shortness of breath and wheezing.   Cardiovascular:  Negative for chest pain, palpitations and leg swelling.  Gastrointestinal:  Positive for abdominal pain. Negative for blood in stool, constipation, diarrhea, heartburn, melena, nausea and vomiting.  Genitourinary:  Negative for dysuria, flank pain and hematuria.  Musculoskeletal:  Positive for joint pain and myalgias. Negative for back pain and falls.  Skin:  Negative for itching and rash.  Neurological:  Negative for dizziness, sensory change, focal weakness, loss of  consciousness, weakness and headaches.  Endo/Heme/Allergies:  Does not bruise/bleed easily.  Psychiatric/Behavioral:  Negative for depression, memory loss and suicidal ideas. The patient is not nervous/anxious.     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[place]) 02/29/2016   skin- 2016- squamous- on right upper arm   Chronic kidney disease    Closed displaced fracture of right femoral neck (HCC) 11/07/2021   Deficiency anemia 11/02/2020   Diabetes mellitus without complication (HCC)    GERD (gastroesophageal reflux disease)    Gout    takes Allopurinol daily   History of chemotherapy    History of colon polyps    benign   History of shingles    Hx of radiation therapy    Hyperlipidemia    Hypertension    Ovarian ca ([place]) dx'd 03/2020   Personal history of chemotherapy    2017   Personal history of radiation therapy    2017  Vitamin D deficiency     Social History   Tobacco Use   Smoking status: Never   Smokeless tobacco: Never  Vaping Use   Vaping Use: Never used  Substance Use Topics   Alcohol use: No   Drug use: No    Family History  Problem Relation Age of Onset   Stroke Mother 36   Other Mother        history of hysterectomy after last childbirth   Diabetes Father    Alzheimer's disease Father    Hypertension Sister    Lung cancer Sister 50       smoker   Other Sister        history of hysterectomy for fibroids   Breast cancer Sister 62   Cirrhosis Sister    Stroke Maternal Grandmother    Heart Problems Maternal Grandmother    Esophageal cancer Maternal Aunt 74       not a smoker   Parkinson's disease Maternal Aunt    Heart attack Maternal Uncle 65   Pancreatic cancer Cousin 79       paternal 1st cousin   Lung cancer Other        nephew dx. 12s; +smoker   Epilepsy Other    Other Other 12       great niece  dx. benign ganglioglioma brain tumor; treated at [facility]   Epilepsy Other        no seizures in 2 years   Colon cancer Neg Hx    Stomach cancer Neg Hx    Colon polyps Neg Hx    Ulcerative colitis Neg Hx    Allergies  Allergen Reactions   Keflex [Cephalexin] Swelling    Tongue and throat swells Tolerates Amp/Amox, Piperacillin   Mobic [Meloxicam] Swelling   Prednisone Swelling and Other (See Comments)    Can Not take High doses   Premarin [Estrogens Conjugated] Hives   Trovan [Alatrofloxacin] Other (See Comments)    Unknown reaction    OBJECTIVE: Blood pressure 130/64, pulse 85, temperature 98.2 F (36.8 C), temperature source Oral, resp. rate 17, height 5' 4"  (1.626 m), weight 64.4 kg, SpO2 97 %.  Physical Exam Constitutional:      General: She is not in acute distress.    Appearance: Normal appearance. She is not ill-appearing or diaphoretic.  HENT:     Head: Normocephalic and atraumatic.     Right Ear: Hearing and external ear normal.     Left Ear: Hearing and external ear normal.     Nose: No nasal deformity or rhinorrhea.  Eyes:     General: No scleral icterus.    Conjunctiva/sclera: Conjunctivae normal.     Right eye: Right conjunctiva is not injected.     Left eye: Left conjunctiva is not injected.     Pupils: Pupils are equal, round, and reactive to light.  Neck:     Vascular: No JVD.  Cardiovascular:     Rate and Rhythm: Normal rate and regular rhythm.     Heart sounds: S1 normal and S2 normal.  Pulmonary:     Effort: No respiratory distress.     Breath sounds: No wheezing.  Abdominal:     General: There is distension.     Palpations: Abdomen is soft.  Musculoskeletal:     Right shoulder: Normal.     Left shoulder: Normal.     Cervical back: Normal range of motion and neck supple.     Right hip: Normal.  Left hip: Normal.     Right knee: Normal.     Left knee: Normal.  Lymphadenopathy:     Head:     Right side of head: No submandibular,  preauricular or posterior auricular adenopathy.     Left side of head: No submandibular, preauricular or posterior auricular adenopathy.     Cervical: No cervical adenopathy.     Right cervical: No superficial or deep cervical adenopathy.    Left cervical: No superficial or deep cervical adenopathy.  Skin:    General: Skin is warm and dry.     Coloration: Skin is not pale.     Findings: No abrasion, bruising, ecchymosis, erythema, lesion or rash.     Nails: There is no clubbing.  Neurological:     General: No focal deficit present.     Mental Status: She is alert and oriented to person, place, and time.     Sensory: No sensory deficit.     Coordination: Coordination normal.     Gait: Gait normal.  Psychiatric:        Attention and Perception: She is attentive.        Mood and Affect: Mood normal.        Speech: Speech normal.        Behavior: Behavior normal. Behavior is cooperative.        Thought Content: Thought content normal.        Judgment: Judgment normal.    JP drain with serosanguineous material Lab Results Lab Results  Component Value Date   WBC 3.5 (L) 08/28/2022   HGB 7.0 (L) 08/28/2022   HCT 22.5 (L) 08/28/2022   MCV 100.0 08/28/2022   PLT 89 (L) 08/28/2022    Lab Results  Component Value Date   CREATININE 1.73 (H) 08/28/2022   BUN 20 08/28/2022   NA 138 08/28/2022   K 3.5 08/28/2022   CL 108 08/28/2022   CO2 24 08/28/2022    Lab Results  Component Value Date   ALT 33 08/26/2022   AST 67 (H) 08/26/2022   ALKPHOS 137 (H) 08/26/2022   BILITOT 0.9 08/26/2022     Microbiology: Recent Results (from the past 240 hour(s))  Aerobic/Anaerobic Culture w Gram Stain (surgical/deep wound)     Status: None (Preliminary result)   Collection Time: 08/25/22 12:36 PM   Specimen: Abscess  Result Value Ref Range Status   Specimen Description   Final    ABSCESS RIGHT HIP Performed at [facility] [address], [place], [place] [address]     Special Requests   Final    NONE Performed at [facility], [place] [address], [place], [place] [address]    Gram Stain   Final    MODERATE WBC PRESENT, PREDOMINANTLY PMN NO ORGANISMS SEEN Performed at [facility], [place] [address], [place], [place] [address]    Culture   Final    CULTURE REINCUBATED FOR BETTER GROWTH NO ANAEROBES ISOLATED; CULTURE IN PROGRESS FOR 5 DAYS    Report Status PENDING  Incomplete    Alcide Evener, [facility] [phone] pager  08/28/2022, 1:24 PM

## 2022-08-29 ENCOUNTER — Encounter (HOSPITAL_COMMUNITY)
Admission: EM | Disposition: A | Discharge status: Home / Self Care | Payer: Self-pay | Source: Home / Self Care | Attending: Internal Medicine

## 2022-08-29 ENCOUNTER — Inpatient Hospital Stay (HOSPITAL_COMMUNITY): Payer: PPO

## 2022-08-29 ENCOUNTER — Inpatient Hospital Stay: Payer: PPO

## 2022-08-29 ENCOUNTER — Inpatient Hospital Stay (HOSPITAL_COMMUNITY): Payer: PPO | Admitting: Anesthesiology

## 2022-08-29 ENCOUNTER — Inpatient Hospital Stay: Payer: PPO | Admitting: Hematology and Oncology

## 2022-08-29 ENCOUNTER — Other Ambulatory Visit: Payer: Self-pay | Admitting: Internal Medicine

## 2022-08-29 ENCOUNTER — Other Ambulatory Visit: Payer: Self-pay

## 2022-08-29 DIAGNOSIS — N390 Urinary tract infection, site not specified: Secondary | ICD-10-CM | POA: Diagnosis not present

## 2022-08-29 DIAGNOSIS — R109 Unspecified abdominal pain: Secondary | ICD-10-CM | POA: Diagnosis not present

## 2022-08-29 DIAGNOSIS — L02415 Cutaneous abscess of right lower limb: Secondary | ICD-10-CM | POA: Diagnosis not present

## 2022-08-29 DIAGNOSIS — T8451XD Infection and inflammatory reaction due to internal right hip prosthesis, subsequent encounter: Secondary | ICD-10-CM

## 2022-08-29 DIAGNOSIS — E119 Type 2 diabetes mellitus without complications: Secondary | ICD-10-CM | POA: Diagnosis not present

## 2022-08-29 DIAGNOSIS — I10 Essential (primary) hypertension: Secondary | ICD-10-CM | POA: Diagnosis not present

## 2022-08-29 DIAGNOSIS — C569 Malignant neoplasm of unspecified ovary: Secondary | ICD-10-CM | POA: Diagnosis not present

## 2022-08-29 DIAGNOSIS — L02419 Cutaneous abscess of limb, unspecified: Secondary | ICD-10-CM | POA: Diagnosis not present

## 2022-08-29 DIAGNOSIS — M1000 Idiopathic gout, unspecified site: Secondary | ICD-10-CM | POA: Diagnosis not present

## 2022-08-29 DIAGNOSIS — Z7984 Long term (current) use of oral hypoglycemic drugs: Secondary | ICD-10-CM | POA: Diagnosis not present

## 2022-08-29 DIAGNOSIS — E1122 Type 2 diabetes mellitus with diabetic chronic kidney disease: Secondary | ICD-10-CM | POA: Diagnosis not present

## 2022-08-29 HISTORY — PX: TOTAL HIP ARTHROPLASTY: SHX124

## 2022-08-29 LAB — PREPARE RBC (CROSSMATCH)

## 2022-08-29 LAB — CBC WITH DIFFERENTIAL/PLATELET
Abs Immature Granulocytes: 0.04 10*3/uL (ref 0.00–0.07)
Basophils Absolute: 0 10*3/uL (ref 0.0–0.1)
Basophils Relative: 0 %
Eosinophils Absolute: 0.2 10*3/uL (ref 0.0–0.5)
Eosinophils Relative: 4 %
HCT: 23.7 % — ABNORMAL LOW (ref 36.0–46.0)
Hemoglobin: 7.3 g/dL — ABNORMAL LOW (ref 12.0–15.0)
Immature Granulocytes: 1 %
Lymphocytes Relative: 24 %
Lymphs Abs: 1.1 10*3/uL (ref 0.7–4.0)
MCH: 30.5 pg (ref 26.0–34.0)
MCHC: 30.8 g/dL (ref 30.0–36.0)
MCV: 99.2 fL (ref 80.0–100.0)
Monocytes Absolute: 0.5 10*3/uL (ref 0.1–1.0)
Monocytes Relative: 10 %
Neutro Abs: 2.8 10*3/uL (ref 1.7–7.7)
Neutrophils Relative %: 61 %
Platelets: 98 10*3/uL — ABNORMAL LOW (ref 150–400)
RBC: 2.39 MIL/uL — ABNORMAL LOW (ref 3.87–5.11)
RDW: 18.3 % — ABNORMAL HIGH (ref 11.5–15.5)
WBC: 4.7 10*3/uL (ref 4.0–10.5)
nRBC: 0 % (ref 0.0–0.2)

## 2022-08-29 LAB — BASIC METABOLIC PANEL
Anion gap: 5 (ref 5–15)
BUN: 25 mg/dL — ABNORMAL HIGH (ref 8–23)
CO2: 23 mmol/L (ref 22–32)
Calcium: 8.7 mg/dL — ABNORMAL LOW (ref 8.9–10.3)
Chloride: 109 mmol/L (ref 98–111)
Creatinine, Ser: 1.65 mg/dL — ABNORMAL HIGH (ref 0.44–1.00)
GFR, Estimated: 30 mL/min — ABNORMAL LOW (ref >60)
Glucose, Bld: 104 mg/dL — ABNORMAL HIGH (ref 70–99)
Potassium: 3.7 mmol/L (ref 3.5–5.1)
Sodium: 137 mmol/L (ref 135–145)

## 2022-08-29 LAB — MAGNESIUM: Magnesium: 2.4 mg/dL (ref 1.7–2.4)

## 2022-08-29 LAB — CK: Total CK: 23 U/L — ABNORMAL LOW (ref 38–234)

## 2022-08-29 LAB — GLUCOSE, CAPILLARY
Glucose-Capillary: 100 mg/dL — ABNORMAL HIGH (ref 70–99)
Glucose-Capillary: 100 mg/dL — ABNORMAL HIGH (ref 70–99)
Glucose-Capillary: 101 mg/dL — ABNORMAL HIGH (ref 70–99)
Glucose-Capillary: 130 mg/dL — ABNORMAL HIGH (ref 70–99)
Glucose-Capillary: 84 mg/dL (ref 70–99)
Glucose-Capillary: 98 mg/dL (ref 70–99)

## 2022-08-29 SURGERY — ARTHROPLASTY, HIP, TOTAL,POSTERIOR APPROACH
Anesthesia: General | Site: Hip | Laterality: Right

## 2022-08-29 MED ORDER — SODIUM CHLORIDE 0.9 % IV SOLN
8.0000 mg/kg | Freq: Every day | INTRAVENOUS | Status: DC
  Filled 2022-08-29: qty 10

## 2022-08-29 MED ORDER — LIDOCAINE HCL (PF) 2 % IJ SOLN
INTRAMUSCULAR | Status: AC
  Filled 2022-08-29: qty 5

## 2022-08-29 MED ORDER — ONDANSETRON HCL 4 MG/2ML IJ SOLN
INTRAMUSCULAR | Status: AC
  Filled 2022-08-29: qty 2

## 2022-08-29 MED ORDER — SODIUM CHLORIDE 0.9 % IV SOLN: INTRAVENOUS | Status: DC | PRN

## 2022-08-29 MED ORDER — SUGAMMADEX SODIUM 200 MG/2ML IV SOLN
INTRAVENOUS | Status: DC | PRN
  Administered 2022-08-29: 200 mg via INTRAVENOUS

## 2022-08-29 MED ORDER — FENTANYL CITRATE PF 50 MCG/ML IJ SOSY: 25.0000 ug | PREFILLED_SYRINGE | INTRAMUSCULAR | Status: DC | PRN

## 2022-08-29 MED ORDER — PROPOFOL 10 MG/ML IV BOLUS
INTRAVENOUS | Status: AC
  Filled 2022-08-29: qty 20

## 2022-08-29 MED ORDER — CEFAZOLIN SODIUM-DEXTROSE 2-4 GM/100ML-% IV SOLN
2.0000 g | INTRAVENOUS | Status: AC
  Administered 2022-08-29: 2 g via INTRAVENOUS
  Filled 2022-08-29: qty 100

## 2022-08-29 MED ORDER — ROCURONIUM BROMIDE 10 MG/ML (PF) SYRINGE
PREFILLED_SYRINGE | INTRAVENOUS | Status: AC
  Filled 2022-08-29: qty 10

## 2022-08-29 MED ORDER — ROCURONIUM BROMIDE 10 MG/ML (PF) SYRINGE
PREFILLED_SYRINGE | INTRAVENOUS | Status: DC | PRN
  Administered 2022-08-29: 50 mg via INTRAVENOUS

## 2022-08-29 MED ORDER — CHLORHEXIDINE GLUCONATE 4 % EX LIQD: 60.0000 mL | Freq: Once | CUTANEOUS | Status: DC

## 2022-08-29 MED ORDER — WATER FOR IRRIGATION, STERILE IR SOLN
Status: DC | PRN
  Administered 2022-08-29: 2000 mL

## 2022-08-29 MED ORDER — ONDANSETRON HCL 4 MG/2ML IJ SOLN
INTRAMUSCULAR | Status: DC | PRN
  Administered 2022-08-29: 4 mg via INTRAVENOUS

## 2022-08-29 MED ORDER — DEXAMETHASONE SODIUM PHOSPHATE 10 MG/ML IJ SOLN
INTRAMUSCULAR | Status: AC
  Filled 2022-08-29: qty 1

## 2022-08-29 MED ORDER — VANCOMYCIN HCL 1000 MG IV SOLR
INTRAVENOUS | Status: DC | PRN
  Administered 2022-08-29: 1000 mg via TOPICAL

## 2022-08-29 MED ORDER — BUPIVACAINE LIPOSOME 1.3 % IJ SUSP
INTRAMUSCULAR | Status: AC
  Filled 2022-08-29: qty 20

## 2022-08-29 MED ORDER — FENTANYL CITRATE (PF) 250 MCG/5ML IJ SOLN
INTRAMUSCULAR | Status: AC
  Filled 2022-08-29: qty 5

## 2022-08-29 MED ORDER — ONDANSETRON HCL 4 MG/2ML IJ SOLN: 4.0000 mg | Freq: Once | INTRAMUSCULAR | Status: DC | PRN

## 2022-08-29 MED ORDER — LIDOCAINE 2% (20 MG/ML) 5 ML SYRINGE
INTRAMUSCULAR | Status: DC | PRN
  Administered 2022-08-29: 80 mg via INTRAVENOUS

## 2022-08-29 MED ORDER — ACETAMINOPHEN 10 MG/ML IV SOLN
INTRAVENOUS | Status: AC
  Filled 2022-08-29: qty 100

## 2022-08-29 MED ORDER — PROPOFOL 10 MG/ML IV BOLUS
INTRAVENOUS | Status: DC | PRN
  Administered 2022-08-29: 130 mg via INTRAVENOUS

## 2022-08-29 MED ORDER — PHENYLEPHRINE HCL-NACL 20-0.9 MG/250ML-% IV SOLN
INTRAVENOUS | Status: DC | PRN
  Administered 2022-08-29: 50 ug/min via INTRAVENOUS

## 2022-08-29 MED ORDER — SODIUM CHLORIDE (PF) 0.9 % IJ SOLN
INTRAMUSCULAR | Status: AC
  Filled 2022-08-29: qty 50

## 2022-08-29 MED ORDER — SODIUM CHLORIDE 0.9 % IR SOLN
Status: DC | PRN
  Administered 2022-08-29: 3000 mL
  Administered 2022-08-29: 1000 mL

## 2022-08-29 MED ORDER — 0.9 % SODIUM CHLORIDE (POUR BTL) OPTIME
TOPICAL | Status: DC | PRN
  Administered 2022-08-29: 1000 mL

## 2022-08-29 MED ORDER — ASPIRIN 81 MG PO TBEC
81.0000 mg | DELAYED_RELEASE_TABLET | Freq: Two times a day (BID) | ORAL | Status: DC
  Administered 2022-08-30 – 2022-09-02 (×6): 81 mg via ORAL
  Filled 2022-08-29 (×7): qty 1

## 2022-08-29 MED ORDER — DEXAMETHASONE SODIUM PHOSPHATE 10 MG/ML IJ SOLN
INTRAMUSCULAR | Status: DC | PRN
  Administered 2022-08-29: 5 mg via INTRAVENOUS

## 2022-08-29 MED ORDER — SODIUM CHLORIDE 0.9% IV SOLUTION: Freq: Once | INTRAVENOUS | Status: DC

## 2022-08-29 MED ORDER — FUROSEMIDE 10 MG/ML IJ SOLN: 20.0000 mg | Freq: Once | INTRAMUSCULAR | Status: DC

## 2022-08-29 MED ORDER — PHENYLEPHRINE HCL (PRESSORS) 10 MG/ML IV SOLN
INTRAVENOUS | Status: AC
  Filled 2022-08-29: qty 1

## 2022-08-29 MED ORDER — ACETAMINOPHEN 10 MG/ML IV SOLN
INTRAVENOUS | Status: DC | PRN
  Administered 2022-08-29: 1000 mg via INTRAVENOUS

## 2022-08-29 MED ORDER — PHENYLEPHRINE 80 MCG/ML (10ML) SYRINGE FOR IV PUSH (FOR BLOOD PRESSURE SUPPORT)
PREFILLED_SYRINGE | INTRAVENOUS | Status: DC | PRN
  Administered 2022-08-29 (×3): 160 ug via INTRAVENOUS

## 2022-08-29 MED ORDER — VANCOMYCIN HCL 1000 MG IV SOLR
INTRAVENOUS | Status: AC
  Filled 2022-08-29: qty 20

## 2022-08-29 MED ORDER — SODIUM CHLORIDE 0.9 % IV SOLN
8.0000 mg/kg | INTRAVENOUS | Status: DC
  Administered 2022-08-29 – 2022-08-31 (×2): 500 mg via INTRAVENOUS
  Filled 2022-08-29 (×2): qty 10

## 2022-08-29 MED ORDER — LACTATED RINGERS IV SOLN: INTRAVENOUS | Status: DC | PRN

## 2022-08-29 MED ORDER — ACETAMINOPHEN 325 MG PO TABS: 650.0000 mg | ORAL_TABLET | Freq: Once | ORAL | Status: DC

## 2022-08-29 MED ORDER — SODIUM CHLORIDE (PF) 0.9 % IJ SOLN
INTRAMUSCULAR | Status: AC
  Filled 2022-08-29: qty 10

## 2022-08-29 MED ORDER — TRANEXAMIC ACID-NACL 1000-0.7 MG/100ML-% IV SOLN
1000.0000 mg | INTRAVENOUS | Status: AC
  Administered 2022-08-29: 1000 mg via INTRAVENOUS
  Filled 2022-08-29: qty 100

## 2022-08-29 MED ORDER — DIPHENHYDRAMINE HCL 25 MG PO CAPS: 25.0000 mg | ORAL_CAPSULE | Freq: Once | ORAL | Status: DC

## 2022-08-29 MED ORDER — POVIDONE-IODINE 10 % EX SWAB
2.0000 | Freq: Once | CUTANEOUS | Status: AC
  Administered 2022-08-29: 2 via TOPICAL

## 2022-08-29 MED ORDER — LACTATED RINGERS IV SOLN: INTRAVENOUS | Status: DC

## 2022-08-29 MED ORDER — OXYCODONE HCL 5 MG PO TABS
2.5000 mg | ORAL_TABLET | ORAL | Status: DC | PRN
  Administered 2022-08-29 – 2022-08-30 (×2): 5 mg via ORAL
  Filled 2022-08-29 (×2): qty 1

## 2022-08-29 SURGICAL SUPPLY — 58 items
BAG COUNTER SPONGE SURGICOUNT (BAG) IMPLANT
BAG DECANTER FOR FLEXI CONT (MISCELLANEOUS) ×1 IMPLANT
BAG ZIPLOCK 12X15 (MISCELLANEOUS) ×1 IMPLANT
BLADE SAW SAG 25X90X1.19 (BLADE) ×1 IMPLANT
CHLORAPREP W/TINT 26 (MISCELLANEOUS) ×2 IMPLANT
COVER SURGICAL LIGHT HANDLE (MISCELLANEOUS) ×1 IMPLANT
DERMABOND ADVANCED .7 DNX12 (GAUZE/BANDAGES/DRESSINGS) ×1 IMPLANT
DRAPE HIP W/POCKET STRL (MISCELLANEOUS) ×1 IMPLANT
DRAPE INCISE IOBAN 66X45 STRL (DRAPES) ×1 IMPLANT
DRAPE INCISE IOBAN 85X60 (DRAPES) ×1 IMPLANT
DRAPE POUCH INSTRU U-SHP 10X18 (DRAPES) ×1 IMPLANT
DRAPE SHEET LG 3/4 BI-LAMINATE (DRAPES) ×3 IMPLANT
DRAPE SURG 17X11 SM STRL (DRAPES) ×1 IMPLANT
DRAPE U-SHAPE 47X51 STRL (DRAPES) ×2 IMPLANT
DRESSING AQUACEL AG SP 3.5X10 (GAUZE/BANDAGES/DRESSINGS) ×1 IMPLANT
DRESSING PEEL AND PLAC PRVNA20 (GAUZE/BANDAGES/DRESSINGS) IMPLANT
DRSG AQUACEL AG SP 3.5X10 (GAUZE/BANDAGES/DRESSINGS) ×1
DRSG PEEL AND PLACE PREVENA 20 (GAUZE/BANDAGES/DRESSINGS) ×1
ELECT BLADE TIP CTD 4 INCH (ELECTRODE) ×1 IMPLANT
ELECT REM PT RETURN 15FT ADLT (MISCELLANEOUS) ×1 IMPLANT
FEMORAL HEAD LFIT V40 28MM PL0 (Orthopedic Implant) IMPLANT
GLOVE BIOGEL PI IND STRL 8 (GLOVE) ×1 IMPLANT
GLOVE SURG ORTHO 8.0 STRL STRW (GLOVE) ×2 IMPLANT
GOWN STRL REUS W/ TWL XL LVL3 (GOWN DISPOSABLE) ×1 IMPLANT
GOWN STRL REUS W/TWL XL LVL3 (GOWN DISPOSABLE) ×1
HANDPIECE INTERPULSE COAX TIP (DISPOSABLE)
HEAD FEM UNIV BIPOLAR 28X46 (Head) IMPLANT
HOLDER FOLEY CATH W/STRAP (MISCELLANEOUS) ×1 IMPLANT
HOOD PEEL AWAY FLYTE STAYCOOL (MISCELLANEOUS) ×3 IMPLANT
KIT BASIN OR (CUSTOM PROCEDURE TRAY) ×1 IMPLANT
KIT TURNOVER KIT A (KITS) IMPLANT
MANIFOLD NEPTUNE II (INSTRUMENTS) ×1 IMPLANT
MARKER SKIN DUAL TIP RULER LAB (MISCELLANEOUS) ×1 IMPLANT
NEEDLE HYPO 22GX1.5 SAFETY (NEEDLE) IMPLANT
NS IRRIG 1000ML POUR BTL (IV SOLUTION) ×1 IMPLANT
PACK TOTAL JOINT (CUSTOM PROCEDURE TRAY) ×1 IMPLANT
PRESSURIZER FEMORAL UNIV (MISCELLANEOUS) IMPLANT
PROTECTOR NERVE ULNAR (MISCELLANEOUS) ×1 IMPLANT
RETRIEVER SUT HEWSON (MISCELLANEOUS) ×1 IMPLANT
SEALER BIPOLAR AQUA 6.0 (INSTRUMENTS) ×1 IMPLANT
SET HNDPC FAN SPRY TIP SCT (DISPOSABLE) IMPLANT
SPIKE FLUID TRANSFER (MISCELLANEOUS) ×3 IMPLANT
SUCTION FRAZIER HANDLE 12FR (TUBING) ×1
SUCTION TUBE FRAZIER 12FR DISP (TUBING) ×1 IMPLANT
SUT BONE WAX W31G (SUTURE) ×1 IMPLANT
SUT ETHIBOND #5 BRAIDED 30INL (SUTURE) ×1 IMPLANT
SUT MNCRL AB 3-0 PS2 18 (SUTURE) ×1 IMPLANT
SUT STRATAFIX 0 PDS 27 VIOLET (SUTURE) ×1
SUT STRATAFIX PDO 1 14 VIOLET (SUTURE) ×1
SUT STRATFX PDO 1 14 VIOLET (SUTURE) ×1
SUT VIC AB 2-0 CT2 27 (SUTURE) ×2 IMPLANT
SUTURE STRATFX 0 PDS 27 VIOLET (SUTURE) ×1 IMPLANT
SUTURE STRATFX PDO 1 14 VIOLET (SUTURE) ×1 IMPLANT
SYR 20ML LL LF (SYRINGE) ×2 IMPLANT
TOWEL OR 17X26 10 PK STRL BLUE (TOWEL DISPOSABLE) ×1 IMPLANT
TRAY FOLEY MTR SLVR 16FR STAT (SET/KITS/TRAYS/PACK) ×1 IMPLANT
UNDERPAD 30X36 HEAVY ABSORB (UNDERPADS AND DIAPERS) ×1 IMPLANT
WATER STERILE IRR 1000ML POUR (IV SOLUTION) ×2 IMPLANT

## 2022-08-29 NOTE — Progress Notes (Signed)
PROGRESS NOTE    Kayla Baker  YOM:[id number] DOB: 24-Nov-1936 DOA: 08/24/2022 PCP: [name], MD    Chief Complaint  Patient presents with   Abdominal Pain    Brief Narrative:  86 year old married female with PMH of ovarian cancer with peritoneal carcinomatosis and fluid retention secondary to third spacing, anemia in neoplastic disease, CKD stage III, type II DM, GERD, gout, HLD, HTN, s/p right hip hemiarthroplasty 11/08/2021, presented to the ED on 08/22/2022 with complaints of abdominal distention, pain and pressure across her abdomen, nausea, decreased appetite and also with right hip pain with decreased mobility.  Dysuria, being treated for UTI with amoxicillin.  Admitted for acute cystitis, right hip pain with imaging showing right gluteal fluid collection.  Started on IV antibiotics.  Orthopedics consulted, who had IR aspirate and placed drain in the gluteal fluid.   Assessment & Plan:   Principal Problem:   Abscess of hip Active Problems:   Essential hypertension   Hyperlipidemia associated with type 2 diabetes mellitus (HCC)   DM2 (diabetes mellitus, type 2) (HCC)   Gout   Ovarian cancer ([place])   Hypomagnesemia   Stage 3b chronic kidney disease (CKD) (HCC)   Acute UTI (urinary tract infection)   Hypokalemia   Hyperbilirubinemia   Normocytic anemia   Thrombocytopenia (HCC)   Protein-calorie malnutrition, severe (HCC)   Right hip pain   Peritoneal carcinomatosis (HCC)   Urinary tract infection without hematuria   Pancytopenia (HCC)   Malnutrition of moderate degree   Prosthetic hip infection ([place])  #1 acute UTI/acute cystitis without hematuria -Patient noted to have a urine culture from 08/05/2022 with a pansensitive E. coli. -Patient noted to have been treated appropriately in the outpatient setting with amoxicillin. -Currently on IV Unasyn, completed 3 days so far which would have covered for UTI. -IV Unasyn continued due to right gluteal fluid collection noted  on CT.  2.  Right hip pain, status post previous right hip hemiarthroplasty for fracture/right gluteal fluid collection seen on CT/?  Prosthetic hip infection -Patient seen in consultation by orthopedics, IR consulted and fluid aspirated 08/25/2022 with Gram stain showing moderate WBC predominantly neutrophils, no organisms, cultures and cytology pending. -Drain currently in place with serosanguineous fluid noted. -Patient states still with pain on ambulation. -Currently on IV Unasyn with preliminary cultures pending with rare Staphylococcus Caprae. -ID consulted, patient seen in consultation by Dr. Tommy Medal this morning 08/28/2022 who reviewed imaging, assessed patient and concern for right-sided prosthetic hip infection. -Dr. Tommy Medal, ID relayed his thoughts to orthopedics, Dr.Marchwiany and patient to be taken to the [facility] today 08/29/2022, for I&D and intraoperative cultures. -ID currently recommending probable 6 weeks of targeted IV antibiotics followed by oral antibiotics for total of 6 months versus a year. -Continue IV Unasyn. -ID, orthopedics following and appreciate input and recommendations.  3.  History of ovarian cancer, peritoneal carcinomatosis with ascites -HCTZ discontinued and patient started on low-dose Lasix and Aldactone. -Currently on a low-salt diet. -Fluid restriction. -Patient noted to have had paracentesis in the past however currently stable and no need for paracentesis at this time. -Patient seen by primary oncologist, Dr. [name], patient noted to be hypoalbuminemic with third spacing and due to current infection chemotherapy being postponed to next week. -It is noted that Dr. [name] has discussed extensively with patient and family multiple times at the cancer center regarding goals of care and wish to continue aggressive therapy at this time. -Status post IV albumin x1 08/27/2022.   -Patient  with bump in creatinine and as such Lasix and Aldactone held which we will  continue to do.   4.  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 -Status post IV albumin x1. -Patient with bump in creatinine and as such diuretics of Aldactone and Lasix held which we will continue to hold for now.  5.  Hypomagnesemia -Magnesium at 2.4 from 1.3. -Repeat labs in the AM.  6.  CKD stage IIIb -Noted to be slightly trending up, patient noted to have CT contrast 08/24/2022, Lasix and Aldactone started on 08/25/2022. -Lasix and Aldactone held with creatinine trending back down currently at 1.65 today.   7.  Hypokalemia -Repleted.  Potassium at 3.7 this morning. -Magnesium repleted, currently at 2.4 from 1.3. -Repeat labs in the AM.  8.  Hypertension -Continue metoprolol 25 mg daily. -HCTZ discontinued likely will not resume on discharge. -Continue to hold low-dose Aldactone and Lasix due to bump in creatinine.  9.  Hyperlipidemia -Continue rosuvastatin 5 mg 3 times a week.  10.  Diabetes mellitus type 2 -Hemoglobin A1c 6.2 (06/16/2022). -CBG 100 this morning. -Continue to hold oral hypoglycemic agents. -Follow-up.  11.  Gout -Allopurinol.  12.  Hyperbilirubinemia -Resolved.  13.  Normocytic anemia/anemia in neoplastic disease -Patient with no overt bleeding. -Hemoglobin currently at 7.3 -Transfused 2 units packed red blood cells preoperatively. -Follow H&H, transfusion threshold hemoglobin < 7  14.  Pancytopenia -Likely due to recent treatment. -Hemoglobin at 7.3 this morning.   -We will transfuse 2 units packed red blood cells preop/perioperatively.   -Follow.  15.  Severe protein calorie malnutrition -Nutritional supplementation.    DVT prophylaxis: SCDs Code Status: Full Family Communication: Updated patient, husband at bedside.  Disposition: TBD  Status is: Inpatient Remains inpatient appropriate because: Severity of illness.     Consultants:  Orthopedics: Dr. Mardelle Matte  08/24/2022 Oncology: Dr. [name] 08/26/2022 ID: Dr. Tommy Medal 08/28/2022  Procedures:  10 French drain placed in the right hip fluid collection per IR, Dr.Mir 08/25/2022 CT abdomen and pelvis 08/24/2022 Lower extremity Dopplers 08/22/2022  Antimicrobials:  IV Unasyn 08/24/2022>>>>   Subjective: Laying in bed.  Husband at bedside.  Patient denies any chest pain, no shortness of breath, no abdominal pain.  Still with pain in the right hip with ambulation.    Objective: Vitals:   08/28/22 1101 08/28/22 2058 08/28/22 2058 08/29/22 0510  BP: 134/74 129/73 129/73 (!) 131/58  Pulse: 88 88 87 86  Resp: 16 16 16 15   Temp: 98.3 F (36.8 C) 98.7 F (37.1 C) 98.7 F (37.1 C) 98.3 F (36.8 C)  TempSrc: Oral Oral Oral Oral  SpO2:  (!) 89% (!) 89% (!) 87%  Weight:      Height:        Intake/Output Summary (Last 24 hours) at 08/29/2022 0800 Last data filed at 08/29/2022 0654 Gross per 24 hour  Intake 340.83 ml  Output --  Net 340.83 ml    Filed Weights   08/26/22 0700 08/27/22 0607  Weight: 68.2 kg 64.4 kg    Examination:  General exam: NAD. Respiratory system: Lungs clear to auscultation bilaterally.  No wheezes, no crackles, no rhonchi.  Fair air movement.  Speaking in full sentences.   Cardiovascular system: Regular rate rhythm no murmurs rubs or gallops.  No JVD.  2+ bilateral lower extremity edema.  Gastrointestinal system: Abdomen is soft, less distended this morning, nontender to palpation, positive bowel sounds. JP drain with serosanguineous drainage noted.  Central nervous system: Alert and oriented. No focal neurological deficits. Extremities: Symmetric 5 x 5 power. Skin: No rashes, lesions or ulcers Psychiatry: Judgement and insight appear normal. Mood & affect appropriate.     Data Reviewed: I have personally reviewed following labs and imaging studies  CBC: Recent Labs  Lab 08/24/22 1029 08/25/22 0529 08/26/22 1258 08/27/22 0706 08/28/22 0602 08/28/22 1339  08/29/22 0623  WBC 6.2 4.3 6.6 3.9* 3.5*  --  4.7  NEUTROABS 3.8  --   --   --  1.9  --  2.8  HGB 9.1* 7.5* 9.3* 7.8* 7.0* 8.4* 7.3*  HCT 27.9* 24.1* 29.7* 25.7* 22.5* 26.8* 23.7*  MCV 98.2 101.3* 101.4* 102.8* 100.0  --  99.2  PLT 145* 104* 142* 96* 89*  --  98*     Basic Metabolic Panel: Recent Labs  Lab 08/24/22 1029 08/25/22 0529 08/25/22 0644 08/26/22 1258 08/27/22 0706 08/28/22 0602  NA 137 138  --  137 139 138  K 3.4* 3.5  --  4.3 3.6 3.5  CL 105 108  --  106 108 108  CO2 23 24  --  23 26 24   GLUCOSE 95 136*  --  108* 95 90  BUN 30* 25*  --  18 17 20   CREATININE 1.14* 1.20*  --  1.39* 1.58* 1.73*  CALCIUM 8.9 8.4*  --  8.9 8.3* 8.7*  MG 1.2*  --  1.8  --   --  1.3*     GFR: Estimated Creatinine Clearance: 20.9 mL/min (A) (by C-G formula based on SCr of 1.73 mg/dL (H)).  Liver Function Tests: Recent Labs  Lab 08/22/22 1539 08/24/22 1029 08/25/22 0529 08/26/22 1258  AST 57* 42* 38 67*  ALT 34 26 23 33  ALKPHOS 107 97 85 137*  BILITOT 0.8 1.3* 0.8 0.9  PROT 5.9* 6.4* 5.2* 6.7  ALBUMIN 2.1* 2.3* 1.9* 2.4*     CBG: Recent Labs  Lab 08/28/22 0745 08/28/22 1149 08/28/22 1636 08/28/22 2059 08/29/22 0738  GLUCAP 91 106* 112* 165* 100*      Recent Results (from the past 240 hour(s))  Aerobic/Anaerobic Culture w Gram Stain (surgical/deep wound)     Status: None (Preliminary result)   Collection Time: 08/25/22 12:36 PM   Specimen: Abscess  Result Value Ref Range Status   Specimen Description   Final    ABSCESS RIGHT HIP Performed at [facility] [address], [place], [place] [address]    Special Requests   Final    NONE Performed at [facility], [place] [address] [place], [place] [address]    Gram Stain   Final    MODERATE WBC PRESENT, PREDOMINANTLY PMN NO ORGANISMS SEEN Performed at [facility], [place] [address] [place], [place] [address]    Culture   Final    RARE STAPHYLOCOCCUS CAPRAE NO  ANAEROBES ISOLATED; CULTURE IN PROGRESS FOR 5 DAYS    Report Status PENDING  Incomplete         Radiology Studies: No results found.      Scheduled Meds:  allopurinol  100 mg Oral Daily   Chlorhexidine Gluconate Cloth  6 each Topical Daily   ezetimibe  10 mg Oral Daily   gabapentin  300 mg Oral TID   magnesium oxide  400 mg Oral Daily   metoprolol succinate  25 mg Oral Daily   polyethylene glycol  17 g Oral BID   Ensure Max Protein  11 oz Oral  BID   rosuvastatin  5 mg Oral Q M,W,F   sodium chloride flush  10-40 mL Intracatheter Q12H   Continuous Infusions:  ampicillin-sulbactam (UNASYN) IV 3 g (08/29/22 0547)     LOS: 5 days    Time spent: 35 minutes    Irine Seal, MD [facility]   To contact the attending provider between 7A-7P or the covering provider during after hours 7P-7A, please log into the web site [email or web address] and access using universal [place] password for that web site. If you do not have the password, please call the hospital operator.  08/29/2022, 8:00 AM

## 2022-08-29 NOTE — Progress Notes (Signed)
Subjective: Plan to proceed with irrigation and debridement right hip with modular component exchange today.  Patient's pain well controlled.  Minimal serosanguineous drainage in the canister.  Denies any new pain.  Denies fevers or chills.  Objective:   VITALS:   Vitals:   08/29/22 0918 08/29/22 1215 08/29/22 1228 08/29/22 1627  BP:  132/64  125/69  Pulse:  79  78  Resp:  16    Temp:  98.3 F (36.8 C)  98.2 F (36.8 C)  TempSrc:  Oral  Oral  SpO2: 94% 91%  93%  Weight:   64.4 kg   Height:        Sensation intact distally Intact pulses distally Dorsiflexion/Plantar flexion intact Incision: well healed No cellulitis present Compartment soft Drain with minimal serosang fluid   Lab Results  Component Value Date   WBC 4.7 08/29/2022   HGB 7.3 (L) 08/29/2022   HCT 23.7 (L) 08/29/2022   MCV 99.2 08/29/2022   PLT 98 (L) 08/29/2022   BMET    Component Value Date/Time   NA 137 08/29/2022 0623   NA 141 11/06/2016 1102   K 3.7 08/29/2022 0623   K 3.9 11/06/2016 1102   CL 109 08/29/2022 0623   CO2 23 08/29/2022 0623   CO2 27 11/06/2016 1102   GLUCOSE 104 (H) 08/29/2022 0623   GLUCOSE 130 11/06/2016 1102   BUN 25 (H) 08/29/2022 0623   BUN 18.0 11/06/2016 1102   CREATININE 1.65 (H) 08/29/2022 0623   CREATININE 1.23 (H) 08/19/2022 0926   CREATININE 1.16 (H) 06/16/2022 1038   CREATININE 1.3 (H) 11/06/2016 1102   CALCIUM 8.7 (L) 08/29/2022 0623   CALCIUM 10.2 11/06/2016 1102   EGFR 46 (L) 06/16/2022 1038   GFRNONAA 30 (L) 08/29/2022 0623   GFRNONAA 43 (L) 08/19/2022 0926   GFRNONAA 34 (L) 02/21/2021 1414      Xray: X-rays and CT scan reviewed demonstrate bipolar hemiarthroplasty without adverse features.  Gluteal fluid collection on the right side present new compared to CT scan from earlier this month.  Assessment/Plan: Day of Surgery   Principal Problem:   Abscess of hip Active Problems:   Essential hypertension   Hyperlipidemia associated with type  2 diabetes mellitus (HCC)   DM2 (diabetes mellitus, type 2) (HCC)   Gout   Ovarian cancer (HCC)   Hypomagnesemia   Stage 3b chronic kidney disease (CKD) (HCC)   Acute UTI (urinary tract infection)   Hypokalemia   Hyperbilirubinemia   Normocytic anemia   Thrombocytopenia (HCC)   Protein-calorie malnutrition, severe (HCC)   Right hip pain   Peritoneal carcinomatosis (HCC)   Urinary tract infection without hematuria   Pancytopenia (HCC)   Malnutrition of moderate degree   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  Given concern for abscess with close proximity to her hip and knee arthroplasty recommend irrigation debridement of the abscess as well as the hip joint itself. Risks and benefits of surgery were discussed with the patient.  Discussed possibility of infection not being cleared and potentially another surgery to explant the hip replacement however given patient's health and low variance organism hopeful for clearance of the infection with this surgery.  Patient also starting with low hemoglobin in the 7s.  Anticipate patient will need a blood transfusion perioperatively.  She is crossmatched.  The risk of surgery including bleeding, nerve injury, fracture, pain were discussed.  Patient and husband are agreeable to proceed with surgery.  Consent signed.  Right hip marked.    Sava Proby A Laurajean Hosek 08/29/2022, 5:11 PM   Charlies Constable, MD  Contact information:   [phone] 7am-5pm epic message Dr. Zachery Dakins, or call office for patient follow up: [phone] After hours and holidays please check [email or web address] for group call information for [facility]

## 2022-08-29 NOTE — Anesthesia Preprocedure Evaluation (Addendum)
Anesthesia Evaluation  Patient identified by MRN, date of birth, ID band Patient awake    Reviewed: Allergy & Precautions, NPO status , Patient's Chart, lab work & pertinent test results, reviewed documented beta blocker date and time   Airway Mallampati: II  TM Distance: >3 FB Neck ROM: Full    Dental  (+) Teeth Intact, Dental Advisory Given   Pulmonary neg pulmonary ROS,    Pulmonary exam normal breath sounds clear to auscultation       Cardiovascular hypertension, Pt. on home beta blockers Normal cardiovascular exam Rhythm:Regular Rate:Normal     Neuro/Psych  Neuromuscular disease negative psych ROS   GI/Hepatic GERD  ,(+) Cirrhosis       ,   Endo/Other  diabetes, Type 2, Oral Hypoglycemic Agents  Renal/GU Renal disease     Musculoskeletal  (+) Arthritis , Gout   Abdominal   Peds  Hematology  (+) Blood dyscrasia (Thrombocytopenia), anemia , Thrombocytopenia    Anesthesia Other Findings right hip infection  H/o breast cancer, ovarian cancer    Reproductive/Obstetrics                           Anesthesia Physical Anesthesia Plan  ASA: 3  Anesthesia Plan: General   Post-op Pain Management: Gabapentin PO (pre-op)*   Induction: Intravenous  PONV Risk Score and Plan: 3 and Dexamethasone, Ondansetron and Treatment may vary due to age or medical condition  Airway Management Planned: Oral ETT  Additional Equipment:   Intra-op Plan:   Post-operative Plan: Extubation in OR  Informed Consent: I have reviewed the patients History and Physical, chart, labs and discussed the procedure including the risks, benefits and alternatives for the proposed anesthesia with the patient or authorized representative who has indicated his/her understanding and acceptance.     Dental advisory given  Plan Discussed with: CRNA  Anesthesia Plan Comments:        Anesthesia Quick  Evaluation

## 2022-08-29 NOTE — Plan of Care (Signed)

## 2022-08-29 NOTE — Progress Notes (Addendum)
Pharmacy Antibiotic Note  Kayla Baker is a 86 y.o. female admitted on 08/24/2022 with  abdominal distension and R hip pain .  PMH significant for R hip hemiarthroplasty on 11/08/2021. IR aspirate of R hip joint collected and is growing staph caprae. Pharmacy has been consulted for daptomycin dosing in the setting of suspected prosthetic hip joint infection.   Plan: Daptomycin 84m/kg TBW (5041m Q48h Monitor CPK weekly, renal function and cultures for sensitivities   Height: 5' 4"  (162.6 cm) Weight: 64.4 kg (142 lb) IBW/kg (Calculated) : 54.7  Temp (24hrs), Avg:98.4 F (36.9 C), Min:98 F (36.7 C), Max:98.7 F (37.1 C)  Recent Labs  Lab 08/24/22 1310 08/25/22 0529 08/26/22 1258 08/27/22 0706 08/28/22 0602 08/29/22 0623  WBC  --  4.3 6.6 3.9* 3.5* 4.7  CREATININE  --  1.20* 1.39* 1.58* 1.73* 1.65*  LATICACIDVEN 1.4  --   --   --   --   --     Estimated Creatinine Clearance: 21.9 mL/min (A) (by C-G formula based on SCr of 1.65 mg/dL (H)).    Allergies  Allergen Reactions   Mobic [Meloxicam] Swelling   Prednisone Swelling and Other (See Comments)    Can Not take High doses   Premarin [Estrogens Conjugated] Hives   Trovan [Alatrofloxacin] Other (See Comments)    Unknown reaction    Antimicrobials this admission: Unasyn 9/24 >> 9/29 Daptomycin 9/29 >>   Dose adjustments this admission: N/a  Microbiology results: 9/25 Wound Cx: Rare staphylococcus caprae   Thank you for allowing pharmacy to be a part of this patient's care.  KeTitus DubinPharmD PGY1 Pharmacy Resident 08/29/2022 10:41 AM

## 2022-08-29 NOTE — Transfer of Care (Addendum)
Immediate Anesthesia Transfer of Care Note  Patient: Kayla Baker  Procedure(s) Performed: incision and drainage hip head exchange (Right: Hip)  Patient Location: PACU  Anesthesia Type:General  Level of Consciousness: awake, alert  and oriented  Airway & Oxygen Therapy: Patient Spontanous Breathing and Patient connected to face mask oxygen  Post-op Assessment: Report given to RN and Post -op Vital signs reviewed and stable  Post vital signs: Reviewed and stable  Last Vitals:  Vitals Value Taken Time  BP    Temp    Pulse 65 08/29/22 1913  Resp 16 08/29/22 1913  SpO2 100 % 08/29/22 1913  Vitals shown include unvalidated device data.  Last Pain:  Vitals:   08/29/22 1627  TempSrc: Oral  PainSc: 0-No pain      Patients Stated Pain Goal: 0 (31/51/76 1607)  Complications: No notable events documented.

## 2022-08-29 NOTE — Op Note (Signed)
08/29/2022  7:15 PM  PATIENT:  Kayla Baker   MRN: [id number]  PRE-OPERATIVE DIAGNOSIS: Right hip abscess concerning for acute prosthetic joint infection of hip hemiarthroplasty  POST-OPERATIVE DIAGNOSIS:  same  PROCEDURE:  Procedure(s): Incision and drainage of right hip with exchange of modular components: bipolar head ball  PREOPERATIVE INDICATIONS:    Kayla Baker is an 86 y.o. female who had undergone right hip hemiarthroplasty for femoral neck fracture nearly 10 months ago.  She had done very well after surgery, she had been ambulatory with minimal assistance.  In the last couple weeks she started noticing more difficulty with ambulating.  Last week developed pain in the right gluteal area.  Was still able to ambulate with a walker but having discomfort.  Work-up demonstrated a gluteal fluid collection.  This was aspirated by interventional radiology and a drain was placed.  Cultures returned positive for staph.  Infectious disease will consult and we agreed best course of action would be open surgical irrigation and debridement with exchange of modular components.  The risks benefits and alternatives were discussed with the patient including but not limited to the risks of nonoperative treatment, versus surgical intervention including infection, bleeding, nerve injury, periprosthetic fracture, the need for revision surgery, dislocation, leg length discrepancy, blood clots, cardiopulmonary complications, morbidity, mortality, among others, and they were willing to proceed.     OPERATIVE REPORT     SURGEON:  Charlies Constable, MD    ASSISTANT: Izola Price, RNFA, (Present throughout the entire procedure,  necessary for completion of procedure in a timely manner, assisting with retraction, instrumentation, and closure)     ANESTHESIA: General  ESTIMATED BLOOD LOSS: 366 cc    COMPLICATIONS:  None.     UNIQUE ASPECTS OF THE CASE: Yellow purulent fluid deep to the IT band and gluteal  fascia, thorough synovectomy and debridement of the right hip joint and copious irrigation.  Exchange of the bipolar head.  Cemented stem remained well fixed.  Specimens: 3 hip joint synovial tissue specimens sent for aerobic and anaerobic culture.  COMPONENTS:   Stryker 28 mm +0 V40 femoral head 28 x 46 bipolar Hemi head Implant Name Type Inv. Item Serial No. Manufacturer Lot No. LRB No. Used Action  HEAD FEM UNIV BIPOLAR [id number] - [id number] Head HEAD FEM UNIV BIPOLAR 28X46  [employer] [id number] Right 1 Implanted  FEMORAL HEAD LFIT V40 28MM PL0 - [id number] Orthopedic Implant FEMORAL HEAD LFIT V40 28MM PL0  [employer] [id number] Right 1 Implanted      PROCEDURE IN DETAIL:   The patient was met in the holding area and  identified.  The appropriate hip was identified and marked at the operative site.  The patient was then transported to the OR  and  placed under anesthesia.  At that point, the patient was  placed in the lateral decubitus position with the operative side up and  secured to the operating room table  and all bony prominences padded. A subaxillary role was also placed.    The operative lower extremity was prepped from the iliac crest to the distal leg.  Sterile draping was performed.  Preoperative antibiotics, 2 gm of Ancef,1 gm of Tranexamic Acid was administered. Time out was performed prior to incision.      A routine posterolateral approach was utilized the patient's prior well-healed surgical incision.  This was  carried down to the subcutaneous tissue.  Gross bleeders were Bovie coagulated.  The iliotibial band and gluteal fascia was identified  found to be intact and well-healed from prior surgery.  Upon incision of the fascia and IT band abundant yellow fluid was encountered concerning for infection.  This fluid collection was completely evacuated.  Charnley retractor was placed with care to protect the sciatic nerve posteriorly.   A capsulotomy was then performed off  the femoral insertion and also tagged with a #5 Ethibond.    Tissue involving the abductors, glutes and hip joint appeared to be erythematous and inflamed but without damage or necrosis. Abundant synovial tissue was resected circumferentially.  The hip was then taken through range of motion found to be very stable at 90 degrees of flexion and 80 degrees of internal rotation.  The hip was then carefully dislocated.  The bipolar head was removed with a tamp.  Trunnion was found to be in good condition without damage.  The cemented stem was well fixed.  With the bipolar head removed a thorough synovectomy was performed circumferentially around the acetabulum and proximal femur.  3 synovial tissue specimens were obtained and sent for aerobic and anaerobic culture.  We then copiously irrigated the hip joint.  First we used surgery for Betadine irrigation.  This was irrigated out with 3 L of normal saline using pulse lavage.  We then irrigated the joint further with Irrisept solution.  This was also placed around the field including on the [place].  This was irrigated as well with normal saline.  We then changed gloves and suction tips and a new clean bipolar head was impacted onto the clean, dry trunnion.  The hip was then carefully reduced and again irrigated with an additional 3 L of normal saline with pulse lavage.  1 g of vancomycin powder was placed into the hip and surrounding soft tissues. The posterior capsule was then reapproximated with 0 PDS.   We repaired the fascia #1 barbed suture, followed by 0 barbed suture for the subcutaneous fat.  Skin was closed with 2-0 PDS and 3-0 nylon for the skin.  Prevena wound VAC dressing was placed.  The patient was then awakened and returned to PACU in stable and satisfactory condition.  Leg lengths in the supine position were assessed and felt to be clinically equal. There were no complications.  Post op recs: WB: WBAT RLE, posterior hip precautions x6  weeks Abx: ancef Imaging: PACU pelvis Xray Dressing: Prevena wound VAC to remain intact until follow-up. DVT prophylaxis: Aspirin 81BID starting POD1 Follow up: 1 week after surgery for a wound check with Dr. Zachery Dakins at [facility].  Address: [address], [place], [place] [address]  Office Phone: [phone]   Charlies Constable, MD Orthopedic Surgeon

## 2022-08-29 NOTE — Progress Notes (Addendum)
PHARMACY CONSULT NOTE FOR:  OUTPATIENT  PARENTERAL ANTIBIOTIC THERAPY (OPAT)  Indication: Prosthetic R hip joint infection Regimen: Daptomycin 80m/kg (5033m Q48h End date: 10/10/22  IV antibiotic discharge orders are pended. To discharging provider:  please sign these orders via discharge navigator,  Select New Orders & click on the button choice - Manage This Unsigned Work.     Thank you for allowing pharmacy to be a part of this patient's care.  KeTitus DubinPharmD PGY1 Pharmacy Resident 08/29/2022 10:43 AM

## 2022-08-29 NOTE — Progress Notes (Signed)
Subjective: No new complaints   Antibiotics:  Anti-infectives (From admission, onward)    Start     Dose/Rate Route Frequency Ordered Stop   08/29/22 1215  [MAR Hold]  DAPTOmycin (CUBICIN) 500 mg in sodium chloride 0.9 % IVPB        (MAR Hold since Fri 08/29/2022 at 1214.Hold Reason: Transfer to a Procedural area)   8 mg/kg  64.4 kg 120 mL/hr over 30 Minutes Intravenous Every 48 hours 08/29/22 1126     08/29/22 1130  DAPTOmycin (CUBICIN) 500 mg in sodium chloride 0.9 % IVPB  Status:  Discontinued        8 mg/kg  64.4 kg 120 mL/hr over 30 Minutes Intravenous Daily 08/29/22 1034 08/29/22 1126   08/29/22 1100  ceFAZolin (ANCEF) IVPB 2g/100 mL premix        2 g 200 mL/hr over 30 Minutes Intravenous On call to O.R. 08/29/22 0825 08/30/22 0559   08/28/22 1100  cephALEXin (KEFLEX) capsule 500 mg        500 mg Oral  Once 08/28/22 0945 08/28/22 1008   08/27/22 1956  Ampicillin-Sulbactam (UNASYN) 3 g in sodium chloride 0.9 % 100 mL IVPB  Status:  Discontinued       Note to Pharmacy: Please adjust as needed.  Thank you.   3 g 200 mL/hr over 30 Minutes Intravenous Every 12 hours 08/27/22 1236 08/29/22 1021   08/24/22 2000  Ampicillin-Sulbactam (UNASYN) 3 g in sodium chloride 0.9 % 100 mL IVPB  Status:  Discontinued       Note to Pharmacy: Please adjust as needed.  Thank you.   3 g 200 mL/hr over 30 Minutes Intravenous Every 6 hours 08/24/22 1739 08/27/22 1236   08/24/22 1330  ampicillin-sulbactam (UNASYN) 1.5 g in sodium chloride 0.9 % 100 mL IVPB        1.5 g 200 mL/hr over 30 Minutes Intravenous  Once 08/24/22 1317 08/24/22 1427       Medications: Scheduled Meds:  [MAR Hold] sodium chloride   Intravenous Once   [MAR Hold] acetaminophen  650 mg Oral Once   [MAR Hold] allopurinol  100 mg Oral Daily   chlorhexidine  60 mL Topical Once   [MAR Hold] Chlorhexidine Gluconate Cloth  6 each Topical Daily   [MAR Hold] diphenhydrAMINE  25 mg Oral Once   [MAR Hold] ezetimibe  10 mg  Oral Daily   [MAR Hold] furosemide  20 mg Intravenous Once   [MAR Hold] gabapentin  300 mg Oral TID   [MAR Hold] magnesium oxide  400 mg Oral Daily   [MAR Hold] metoprolol succinate  25 mg Oral Daily   [MAR Hold] polyethylene glycol  17 g Oral BID   [MAR Hold] Ensure Max Protein  11 oz Oral BID   [MAR Hold] rosuvastatin  5 mg Oral Q M,W,F   [MAR Hold] sodium chloride flush  10-40 mL Intracatheter Q12H   Continuous Infusions:   ceFAZolin (ANCEF) IV     [MAR Hold] DAPTOmycin (CUBICIN) 500 mg in sodium chloride 0.9 % IVPB 500 mg (08/29/22 1159)   lactated ringers 75 mL/hr at 08/29/22 1253   tranexamic acid     PRN Meds:.[MAR Hold] acetaminophen **OR** [MAR Hold] acetaminophen, [MAR Hold] diphenhydrAMINE, [MAR Hold] EPINEPHrine, [MAR Hold] ondansetron **OR** [MAR Hold] ondansetron (ZOFRAN) IV, [MAR Hold] oxyCODONE, [MAR Hold] sodium chloride flush    Objective: Weight change:   Intake/Output Summary (Last 24 hours) at 08/29/2022 1455 Last data filed  at 08/29/2022 0654 Gross per 24 hour  Intake 100.83 ml  Output --  Net 100.83 ml   Blood pressure 132/64, pulse 79, temperature 98.3 F (36.8 C), temperature source Oral, resp. rate 16, height 5' 4"  (1.626 m), weight 64.4 kg, SpO2 91 %. Temp:  [98.3 F (36.8 C)-98.7 F (37.1 C)] 98.3 F (36.8 C) (09/29 1215) Pulse Rate:  [79-88] 79 (09/29 1215) Resp:  [15-16] 16 (09/29 1215) BP: (129-132)/(58-73) 132/64 (09/29 1215) SpO2:  [87 %-94 %] 91 % (09/29 1215) Weight:  [64.4 kg] 64.4 kg (09/29 1228)  Physical Exam: Physical Exam Constitutional:      General: She is not in acute distress.    Appearance: She is well-developed. She is not diaphoretic.  HENT:     Head: Normocephalic and atraumatic.     Right Ear: External ear normal.     Left Ear: External ear normal.     Mouth/Throat:     Pharynx: No oropharyngeal exudate.  Eyes:     General: No scleral icterus.    Conjunctiva/sclera: Conjunctivae normal.     Pupils: Pupils are  equal, round, and reactive to light.  Cardiovascular:     Rate and Rhythm: Normal rate and regular rhythm.  Pulmonary:     Effort: Pulmonary effort is normal. No respiratory distress.     Breath sounds: No wheezing.  Abdominal:     General: There is distension.     Palpations: Abdomen is soft.  Musculoskeletal:        General: No tenderness. Normal range of motion.  Lymphadenopathy:     Cervical: No cervical adenopathy.  Skin:    General: Skin is warm and dry.     Coloration: Skin is pale.     Findings: No erythema or rash.  Neurological:     General: No focal deficit present.     Mental Status: She is alert and oriented to person, place, and time.     Motor: No abnormal muscle tone.     Coordination: Coordination normal.  Psychiatric:        Mood and Affect: Mood normal.        Behavior: Behavior normal.        Thought Content: Thought content normal.        Judgment: Judgment normal.     Drain in place  CBC:    BMET Recent Labs    08/28/22 0602 08/29/22 0623  NA 138 137  K 3.5 3.7  CL 108 109  CO2 24 23  GLUCOSE 90 104*  BUN 20 25*  CREATININE 1.73* 1.65*  CALCIUM 8.7* 8.7*     Liver Panel  No results for input(s): "PROT", "ALBUMIN", "AST", "ALT", "ALKPHOS", "BILITOT", "BILIDIR", "IBILI" in the last 72 hours.     Sedimentation Rate No results for input(s): "ESRSEDRATE" in the last 72 hours. C-Reactive Protein No results for input(s): "CRP" in the last 72 hours.  Micro Results: Recent Results (from the past 720 hour(s))  Culture, blood (Routine X 2) w Reflex to ID Panel     Status: None   Collection Time: 08/05/22 12:49 PM   Specimen: BLOOD  Result Value Ref Range Status   Specimen Description   Final    BLOOD RIGHT ANTECUBITAL Performed at [facility] [address] [place], [place] [address]    Special Requests   Final    BOTTLES DRAWN AEROBIC AND ANAEROBIC Blood Culture adequate volume Performed at [facility]  [address] [place], [place] [address]    Culture   Final    NO GROWTH 5 DAYS Performed at [facility], [place] [address], [place], [place] [address]    Report Status 08/10/2022 FINAL  Final  Culture, blood (Routine X 2) w Reflex to ID Panel     Status: None   Collection Time: 08/05/22  1:15 PM   Specimen: BLOOD  Result Value Ref Range Status   Specimen Description   Final    BLOOD LEFT ANTECUBITAL Performed at [facility] [address] [place], [place] [address]    Special Requests   Final    BOTTLES DRAWN AEROBIC AND ANAEROBIC Blood Culture adequate volume Performed at [facility] [address] [place], [place] [address]    Culture   Final    NO GROWTH 5 DAYS Performed at [facility], [place] [address] [place], [place] [address]    Report Status 08/10/2022 FINAL  Final  Urine Culture     Status: Abnormal   Collection Time: 08/05/22  5:18 PM   Specimen: In/Out Cath Urine  Result Value Ref Range Status   Specimen Description   Final    IN/OUT CATH URINE Performed at [facility] [address] [place], [place] [address]    Special Requests   Final    NONE Performed at [facility], [place] [address] [place], [place] [address]    Culture >=100,000 COLONIES/mL ESCHERICHIA COLI (A)  Final   Report Status 08/07/2022 FINAL  Final   Organism ID, Bacteria ESCHERICHIA COLI (A)  Final      Susceptibility   Escherichia coli - MIC*    AMPICILLIN <=2 SENSITIVE Sensitive     CEFAZOLIN <=4 SENSITIVE Sensitive     CEFEPIME <=0.12 SENSITIVE Sensitive     CEFTRIAXONE <=0.25 SENSITIVE Sensitive     CIPROFLOXACIN <=0.25 SENSITIVE Sensitive     GENTAMICIN <=1 SENSITIVE Sensitive     IMIPENEM <=0.25 SENSITIVE Sensitive     NITROFURANTOIN <=16 SENSITIVE Sensitive     TRIMETH/SULFA <=20 SENSITIVE Sensitive     AMPICILLIN/SULBACTAM <=2 SENSITIVE Sensitive     PIP/TAZO <=4  SENSITIVE Sensitive     * >=100,000 COLONIES/mL ESCHERICHIA COLI  Aerobic/Anaerobic Culture w Gram Stain (surgical/deep wound)     Status: None (Preliminary result)   Collection Time: 08/25/22 12:36 PM   Specimen: Abscess  Result Value Ref Range Status   Specimen Description   Final    ABSCESS RIGHT HIP Performed at [facility] [address], [place], [place] [address]    Special Requests   Final    NONE Performed at [facility], [place] [address], [place], [place] [address]    Gram Stain   Final    MODERATE WBC PRESENT, PREDOMINANTLY PMN NO ORGANISMS SEEN Performed at [facility], [place] [address], [place], [place] [address]    Culture   Final    RARE STAPHYLOCOCCUS CAPRAE SUSCEPTIBILITIES TO FOLLOW NO ANAEROBES ISOLATED; CULTURE IN PROGRESS FOR 5 DAYS    Report Status PENDING  Incomplete    Studies/Results: No results found.    Assessment/Plan:  INTERVAL HISTORY:  cultures are yielding a staphylococcus caprae S pending     Principal Problem:   Abscess of hip Active Problems:   Essential hypertension   Hyperlipidemia associated with type 2 diabetes mellitus (HCC)   DM2 (diabetes mellitus, type 2) (HCC)   Gout  Ovarian cancer (HCC)   Hypomagnesemia   Stage 3b chronic kidney disease (CKD) (HCC)   Acute UTI (urinary tract infection)   Hypokalemia   Hyperbilirubinemia   Normocytic anemia   Thrombocytopenia (HCC)   Protein-calorie malnutrition, severe (HCC)   Right hip pain   Peritoneal carcinomatosis (HCC)   Urinary tract infection without hematuria   Pancytopenia (HCC)   Malnutrition of moderate degree   Prosthetic hip infection (HCC)    Kayla Baker is a 86 y.o. female with hx of HTN, hyperlipidemia, breast cancer, cephalosporin allergy and recent ovarian cancer with carcinomatosis who presented to ER w abdominal pain (her ascites) and right  hip pain with abscess found in gluteal muscles that appears to  communicate with hip. She is going to OR for I and D and single staged poly-exchange, along with operative cultures  #1 PJI  Presuming that the staph caprae is the only relevant organism and that it is MR we will plan on 6 weeks of IV daptomycin--though if the organisms is S to cephalosporins we can now use ancef  Since she does not need a PICC line IV abx can be given via port  We will put in OPAT order once we know the sensis on the staph species   She will NOT need a PICC as she has a port and will be able to have that accessed to give IV abx  We will follow 6 weeks of IV abx with orals through a year postoperatively  #2 Cephalosporin allergy: she toleraterd keflex without problems and this allergy has been removed from list, PCP and pharmacies should be notified  I spent 50mnutes with the patient including than 50% of the time in face to face counseling of the patient re PJI personally reviewing along with review of medical records in preparation for the visit and during the visit and in coordination of her care.  My partner Dr. WJuleen Chinawill followup on the culture data and adjust antibiotics. He is available for questions. I have scheduled the patient for followup with me. I will see her again on Monday--IF she is still in the hospital and not DC yet.        LOS: 5 days   CAlcide Evener9/29/2023, 2:55 PM

## 2022-08-29 NOTE — Progress Notes (Signed)
2020 pt returned to unit.

## 2022-08-29 NOTE — Anesthesia Procedure Notes (Signed)
Procedure Name: Intubation Date/Time: 08/29/2022 5:19 PM  Performed by: Lollie Sails, CRNAPre-anesthesia Checklist: Patient identified, Emergency Drugs available, Suction available, Patient being monitored and Timeout performed Patient Re-evaluated:Patient Re-evaluated prior to induction Oxygen Delivery Method: Circle system utilized Preoxygenation: Pre-oxygenation with 100% oxygen Induction Type: IV induction Ventilation: Mask ventilation without difficulty Laryngoscope Size: Miller and 3 Grade View: Grade II Tube type: Oral Tube size: 7.0 mm Number of attempts: 1 Airway Equipment and Method: Stylet Placement Confirmation: ETT inserted through vocal cords under direct vision, positive ETCO2 and breath sounds checked- equal and bilateral Secured at: 22 cm Tube secured with: Tape Dental Injury: Teeth and Oropharynx as per pre-operative assessment

## 2022-08-29 NOTE — Discharge Instructions (Signed)
INSTRUCTIONS AFTER JOINT REPLACEMENT   Remove items at home which could result in a fall. This includes throw rugs or furniture in walking pathways ICE to the affected joint every three hours while awake for 30 minutes at a time, for at least the first 3-5 days, and then as needed for pain and swelling.  Continue to use ice for pain and swelling. You may notice swelling that will progress down to the foot and ankle.  This is normal after surgery.  Elevate your leg when you are not up walking on it.   Continue to use the breathing machine you got in the hospital (incentive spirometer) which will help keep your temperature down.  It is common for your temperature to cycle up and down following surgery, especially at night when you are not up moving around and exerting yourself.  The breathing machine keeps your lungs expanded and your temperature down.   DIET:  As you were doing prior to hospitalization, we recommend a well-balanced diet.  DRESSING / WOUND CARE / SHOWERING  Keep the surgical dressing until follow up.  The dressing is water proof, so you can shower without any extra covering.  IF THE DRESSING FALLS OFF or the wound gets wet inside, change the dressing with sterile gauze.  Please use good hand washing techniques before changing the dressing.  Do not use any lotions or creams on the incision until instructed by your surgeon.    ACTIVITY  Increase activity slowly as tolerated, but follow the weight bearing instructions below.   No driving for 6 weeks or until further direction given by your physician.  You cannot drive while taking narcotics.  No lifting or carrying greater than 10 lbs. until further directed by your surgeon. Avoid periods of inactivity such as sitting longer than an hour when not asleep. This helps prevent blood clots.  You may return to work once you are authorized by your doctor.     WEIGHT BEARING   Weight bearing as tolerated with assist device (walker, cane,  etc) as directed, use it as long as suggested by your surgeon or therapist, typically at least 4-6 weeks.   EXERCISES  Results after joint replacement surgery are often greatly improved when you follow the exercise, range of motion and muscle strengthening exercises prescribed by your doctor. Safety measures are also important to protect the joint from further injury. Any time any of these exercises cause you to have increased pain or swelling, decrease what you are doing until you are comfortable again and then slowly increase them. If you have problems or questions, call your caregiver or physical therapist for advice.   Rehabilitation is important following a joint replacement. After just a few days of immobilization, the muscles of the leg can become weakened and shrink (atrophy).  These exercises are designed to build up the tone and strength of the thigh and leg muscles and to improve motion. Often times heat used for twenty to thirty minutes before working out will loosen up your tissues and help with improving the range of motion but do not use heat for the first two weeks following surgery (sometimes heat can increase post-operative swelling).   These exercises can be done on a training (exercise) mat, on the floor, on a table or on a bed. Use whatever works the best and is most comfortable for you.    Use music or television while you are exercising so that the exercises are a pleasant break in your  day. This will make your life better with the exercises acting as a break in your routine that you can look forward to.   Perform all exercises about fifteen times, three times per day or as directed.  You should exercise both the operative leg and the other leg as well.  Exercises include:   Quad Sets - Tighten up the muscle on the front of the thigh (Quad) and hold for 5-10 seconds.   Straight Leg Raises - With your knee straight (if you were given a brace, keep it on), lift the leg to 60  degrees, hold for 3 seconds, and slowly lower the leg.  Perform this exercise against resistance later as your leg gets stronger.  Leg Slides: Lying on your back, slowly slide your foot toward your buttocks, bending your knee up off the floor (only go as far as is comfortable). Then slowly slide your foot back down until your leg is flat on the floor again.  Angel Wings: Lying on your back spread your legs to the side as far apart as you can without causing discomfort.  Hamstring Strength:  Lying on your back, push your heel against the floor with your leg straight by tightening up the muscles of your buttocks.  Repeat, but this time bend your knee to a comfortable angle, and push your heel against the floor.  You may put a pillow under the heel to make it more comfortable if necessary.   A rehabilitation program following joint replacement surgery can speed recovery and prevent re-injury in the future due to weakened muscles. Contact your doctor or a physical therapist for more information on knee rehabilitation.    CONSTIPATION  Constipation is defined medically as fewer than three stools per week and severe constipation as less than one stool per week.  Even if you have a regular bowel pattern at home, your normal regimen is likely to be disrupted due to multiple reasons following surgery.  Combination of anesthesia, postoperative narcotics, change in appetite and fluid intake all can affect your bowels.   YOU MUST use at least one of the following options; they are listed in order of increasing strength to get the job done.  They are all available over the counter, and you may need to use some, POSSIBLY even all of these options:    Drink plenty of fluids (prune juice may be helpful) and high fiber foods Colace 100 mg by mouth twice a day  Senokot for constipation as directed and as needed Dulcolax (bisacodyl), take with full glass of water  Miralax (polyethylene glycol) once or twice a day as  needed.  If you have tried all these things and are unable to have a bowel movement in the first 3-4 days after surgery call either your surgeon or your primary doctor.    If you experience loose stools or diarrhea, hold the medications until you stool forms back up.  If your symptoms do not get better within 1 week or if they get worse, check with your doctor.  If you experience "the worst abdominal pain ever" or develop nausea or vomiting, please contact the office immediately for further recommendations for treatment.   ITCHING:  If you experience itching with your medications, try taking only a single pain pill, or even half a pain pill at a time.  You can also use Benadryl over the counter for itching or also to help with sleep.   TED HOSE STOCKINGS:  Use stockings on both  legs until for at least 2 weeks or as directed by physician office. They may be removed at night for sleeping.  MEDICATIONS:  See your medication summary on the "After Visit Summary" that nursing will review with you.  You may have some home medications which will be placed on hold until you complete the course of blood thinner medication.  It is important for you to complete the blood thinner medication as prescribed.   Blood clot prevention (DVT Prophylaxis): After surgery you are at an increased risk for a blood clot. you were prescribed a blood thinner, Aspirin 46m, to be taken twice daily for a total of 4 weeks from surgery to help reduce your risk of getting a blood clot. This will help prevent a blood clot. Signs of a pulmonary embolus (blood clot in the lungs) include sudden short of breath, feeling lightheaded or dizzy, chest pain with a deep breath, rapid pulse rapid breathing. Signs of a blood clot in your arms or legs include new unexplained swelling and cramping, warm, red or darkened skin around the painful area. Please call the office or 911 right away if these signs or symptoms develop.  PRECAUTIONS:  If you  experience chest pain or shortness of breath - call 911 immediately for transfer to the hospital emergency department.   If you develop a fever greater that 101 F, purulent drainage from wound, increased redness or drainage from wound, foul odor from the wound/dressing, or calf pain - CONTACT YOUR SURGEON.                                                   FOLLOW-UP APPOINTMENTS:  If you do not already have a post-op appointment, please call the office for an appointment to be seen by your surgeon.  Guidelines for how soon to be seen are listed in your "After Visit Summary", but are typically between 2-3 weeks after surgery.     POST-OPERATIVE OPIOID TAPER INSTRUCTIONS: It is important to wean off of your opioid medication as soon as possible. If you do not need pain medication after your surgery it is ok to stop day one. Opioids include: Codeine, Hydrocodone(Norco, Vicodin), Oxycodone(Percocet, oxycontin) and hydromorphone amongst others.  Long term and even short term use of opiods can cause: Increased pain response Dependence Constipation Depression Respiratory depression And more.  Withdrawal symptoms can include Flu like symptoms Nausea, vomiting And more Techniques to manage these symptoms Hydrate well Eat regular healthy meals Stay active Use relaxation techniques(deep breathing, meditating, yoga) Do Not substitute Alcohol to help with tapering If you have been on opioids for less than two weeks and do not have pain than it is ok to stop all together.  Plan to wean off of opioids This plan should start within one week post op of your joint replacement. Maintain the same interval or time between taking each dose and first decrease the dose.  Cut the total daily intake of opioids by one tablet each day Next start to increase the time between doses. The last dose that should be eliminated is the evening dose.   MAKE SURE YOU:  Understand these instructions.  Get help right  away if you are not doing well or get worse.    Thank you for letting [facility] a part of your medical care team.  It is  a privilege we respect greatly.  We hope these instructions will help you stay on track for a fast and full recovery!

## 2022-08-30 DIAGNOSIS — Z96649 Presence of unspecified artificial hip joint: Secondary | ICD-10-CM

## 2022-08-30 DIAGNOSIS — N390 Urinary tract infection, site not specified: Secondary | ICD-10-CM | POA: Diagnosis not present

## 2022-08-30 DIAGNOSIS — E1122 Type 2 diabetes mellitus with diabetic chronic kidney disease: Secondary | ICD-10-CM | POA: Diagnosis not present

## 2022-08-30 DIAGNOSIS — T8459XD Infection and inflammatory reaction due to other internal joint prosthesis, subsequent encounter: Secondary | ICD-10-CM

## 2022-08-30 DIAGNOSIS — I10 Essential (primary) hypertension: Secondary | ICD-10-CM | POA: Diagnosis not present

## 2022-08-30 DIAGNOSIS — Z853 Personal history of malignant neoplasm of breast: Secondary | ICD-10-CM

## 2022-08-30 DIAGNOSIS — L02419 Cutaneous abscess of limb, unspecified: Secondary | ICD-10-CM | POA: Diagnosis not present

## 2022-08-30 DIAGNOSIS — E440 Moderate protein-calorie malnutrition: Secondary | ICD-10-CM

## 2022-08-30 LAB — CBC WITH DIFFERENTIAL/PLATELET
Abs Immature Granulocytes: 0.07 10*3/uL (ref 0.00–0.07)
Basophils Absolute: 0 10*3/uL (ref 0.0–0.1)
Basophils Relative: 0 %
Eosinophils Absolute: 0 10*3/uL (ref 0.0–0.5)
Eosinophils Relative: 0 %
HCT: 27.2 % — ABNORMAL LOW (ref 36.0–46.0)
Hemoglobin: 8.7 g/dL — ABNORMAL LOW (ref 12.0–15.0)
Immature Granulocytes: 1 %
Lymphocytes Relative: 16 %
Lymphs Abs: 0.9 10*3/uL (ref 0.7–4.0)
MCH: 31 pg (ref 26.0–34.0)
MCHC: 32 g/dL (ref 30.0–36.0)
MCV: 96.8 fL (ref 80.0–100.0)
Monocytes Absolute: 0.1 10*3/uL (ref 0.1–1.0)
Monocytes Relative: 2 %
Neutro Abs: 4.2 10*3/uL (ref 1.7–7.7)
Neutrophils Relative %: 81 %
Platelets: 101 10*3/uL — ABNORMAL LOW (ref 150–400)
RBC: 2.81 MIL/uL — ABNORMAL LOW (ref 3.87–5.11)
RDW: 17.9 % — ABNORMAL HIGH (ref 11.5–15.5)
WBC: 5.3 10*3/uL (ref 4.0–10.5)
nRBC: 0 % (ref 0.0–0.2)

## 2022-08-30 LAB — GLUCOSE, CAPILLARY
Glucose-Capillary: 135 mg/dL — ABNORMAL HIGH (ref 70–99)
Glucose-Capillary: 149 mg/dL — ABNORMAL HIGH (ref 70–99)
Glucose-Capillary: 183 mg/dL — ABNORMAL HIGH (ref 70–99)
Glucose-Capillary: 255 mg/dL — ABNORMAL HIGH (ref 70–99)

## 2022-08-30 LAB — BASIC METABOLIC PANEL
Anion gap: 7 (ref 5–15)
BUN: 28 mg/dL — ABNORMAL HIGH (ref 8–23)
CO2: 21 mmol/L — ABNORMAL LOW (ref 22–32)
Calcium: 8.7 mg/dL — ABNORMAL LOW (ref 8.9–10.3)
Chloride: 109 mmol/L (ref 98–111)
Creatinine, Ser: 1.55 mg/dL — ABNORMAL HIGH (ref 0.44–1.00)
GFR, Estimated: 33 mL/min — ABNORMAL LOW (ref >60)
Glucose, Bld: 147 mg/dL — ABNORMAL HIGH (ref 70–99)
Potassium: 4.4 mmol/L (ref 3.5–5.1)
Sodium: 137 mmol/L (ref 135–145)

## 2022-08-30 LAB — MAGNESIUM: Magnesium: 2 mg/dL (ref 1.7–2.4)

## 2022-08-30 LAB — SURGICAL PCR SCREEN
MRSA, PCR: NEGATIVE
Staphylococcus aureus: NEGATIVE

## 2022-08-30 NOTE — Progress Notes (Signed)
PROGRESS NOTE    Kayla Baker  RKY:[id number] DOB: May 03, 1936 DOA: 08/24/2022 PCP: [name], MD    Chief Complaint  Patient presents with   Abdominal Pain    Brief Narrative:  86 year old married female with PMH of ovarian cancer with peritoneal carcinomatosis and fluid retention secondary to third spacing, anemia in neoplastic disease, CKD stage III, type II DM, GERD, gout, HLD, HTN, s/p right hip hemiarthroplasty 11/08/2021, presented to the ED on 08/22/2022 with complaints of abdominal distention, pain and pressure across her abdomen, nausea, decreased appetite and also with right hip pain with decreased mobility.  Dysuria, being treated for UTI with amoxicillin.  Admitted for acute cystitis, right hip pain with imaging showing right gluteal fluid collection.  Started on IV antibiotics.  Orthopedics consulted, who had IR aspirate and placed drain in the gluteal fluid.   Assessment & Plan:   Principal Problem:   Abscess of hip Active Problems:   Prosthetic hip infection ([place])   Essential hypertension   Hyperlipidemia associated with type 2 diabetes mellitus (HCC)   DM2 (diabetes mellitus, type 2) (HCC)   Gout   Ovarian cancer ([place])   Hypomagnesemia   Stage 3b chronic kidney disease (CKD) (HCC)   Acute UTI (urinary tract infection)   Hypokalemia   Hyperbilirubinemia   Normocytic anemia   Thrombocytopenia (HCC)   Protein-calorie malnutrition, severe (HCC)   Right hip pain   Peritoneal carcinomatosis (HCC)   Urinary tract infection without hematuria   Pancytopenia (HCC)   Malnutrition of moderate degree  #1 acute UTI/acute cystitis without hematuria -Patient noted to have a urine culture from 08/05/2022 with a pansensitive E. coli. -Patient noted to have been treated appropriately in the outpatient setting with amoxicillin. -Status post 3 days IV Unasyn.    2.  Right hip pain, status post previous right hip hemiarthroplasty for fracture/right gluteal fluid collection  seen on CT/probable prosthetic hip infection -Patient seen in consultation by orthopedics, IR consulted and fluid aspirated 08/25/2022 with Gram stain showing moderate WBC predominantly neutrophils, no organisms, cultures and cytology pending. -Drain currently in place with serosanguineous fluid noted. -Patient states still with pain on ambulation. -Currently on IV Unasyn with preliminary cultures pending with rare Staphylococcus Caprae. -ID consulted, patient seen in consultation by Dr. Tommy Medal this morning 08/28/2022 who reviewed imaging, assessed patient and concern for right-sided prosthetic hip infection. -Dr. Tommy Medal, ID relayed his thoughts to orthopedics, Dr.Marchwiany and patient taken to the OR, 08/29/2022, for I&D and intraoperative cultures. -Patient with some clinical improvement. -PT/OT. -ID currently recommending probable 6 weeks of targeted IV antibiotics followed by oral antibiotics for total of 6 months versus a year. -IV Unasyn changed to IV daptomycin per ID. -ID, orthopedics following and appreciate input and recommendations.  3.  History of ovarian cancer, peritoneal carcinomatosis with ascites -HCTZ discontinued and patient started on low-dose Lasix and Aldactone. -Currently on a low-salt diet. -Fluid restriction. -Patient noted to have had paracentesis in the past however currently stable and no need for paracentesis at this time. -Patient seen by primary oncologist, Dr. [name], patient noted to be hypoalbuminemic with third spacing and due to current infection chemotherapy being postponed to next week. -It is noted that Dr. [name] has discussed extensively with patient and family multiple times at the cancer center regarding goals of care and wish to continue aggressive therapy at this time. -Status post IV albumin x1 08/27/2022.   -Patient with bump in creatinine and as such Lasix and Aldactone held which  we will continue to do.  -Outpatient follow-up with  oncology.  4.  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 -Status post IV albumin x1. -Patient with bump in creatinine and as such diuretics of Aldactone and Lasix held which we will continue to hold for now.  5.  Hypomagnesemia -Magnesium at 2.0 from 2.4 from 1.3. -Repeat labs in the AM.  6.  CKD stage IIIb -Noted to be slightly trending up, patient noted to have CT contrast 08/24/2022, Lasix and Aldactone started on 08/25/2022. -Lasix and Aldactone held with creatinine trending back down currently at 1.55 today.   7.  Hypokalemia -Repleted.   -Potassium at 4.4 this morning.  -Magnesium repleted, currently at 2.0 from 2.4 from 1.3. -Repeat labs in the AM.  8.  Hypertension -Continue metoprolol 25 mg daily. -HCTZ discontinued likely will not resume on discharge. -Continue to hold low-dose Aldactone and Lasix due to bump in creatinine.  9.  Hyperlipidemia -Continue rosuvastatin 5 mg 3 times a week.  10.  Diabetes mellitus type 2 -Hemoglobin A1c 6.2 (06/16/2022). -CBG 135 this morning. -Continue to hold oral hypoglycemic agents. -Follow-up.  11.  Gout -Allopurinol.  12.  Hyperbilirubinemia -Resolved.  13.  Normocytic anemia/anemia in neoplastic disease -Patient with no overt bleeding. -Hemoglobin currently at 8.7. -Status posttransfusion 1 unit packed red blood cells preoperatively.  14.  Pancytopenia -Likely due to recent treatment. -Hemoglobin at 8.7 status posttransfusion 1 units packed red blood cells 08/29/2022.   -Follow.  15.  Severe protein calorie malnutrition -Continue nutritional supplementation.    DVT prophylaxis: SCDs Code Status: Full Family Communication: Updated patient, sister at bedside.  Disposition: TBD  Status is: Inpatient Remains inpatient appropriate because: Severity of illness.     Consultants:  Orthopedics: Dr. Mardelle Matte 08/24/2022 Oncology: Dr. [name]  08/26/2022 ID: Dr. Tommy Medal 08/28/2022  Procedures:  10 French drain placed in the right hip fluid collection per IR, Dr.Mir 08/25/2022 CT abdomen and pelvis 08/24/2022 Lower extremity Dopplers 08/22/2022 Incision and drainage of right hip with exchange of modular components: Bipolar head ball per Dr. Zachery Dakins 08/29/2022 Transfusion 1 unit packed red blood cells 08/29/2022.  Antimicrobials:  IV Unasyn 08/24/2022>>>> 08/29/2022 IV daptomycin 08/29/2022>>>>   Subjective: Patient sitting up in chair.  States that at bedside.  No chest pain, no shortness of breath, no abdominal pain.  Stated he ate some peanut butter and crackers and blood sugars in the 250s and apologetic.  States some improvement with right hip pain postoperatively.   Objective: Vitals:   08/29/22 1945 08/29/22 2000 08/29/22 2028 08/30/22 0452  BP: 125/66 130/67 133/73 112/69  Pulse: (!) 58 60 62 72  Resp: 13 15 16 16   Temp:   (!) 97.5 F (36.4 C) 98 F (36.7 C)  TempSrc:   Oral Oral  SpO2: 94% 94% 95% 94%  Weight:      Height:        Intake/Output Summary (Last 24 hours) at 08/30/2022 1226 Last data filed at 08/30/2022 0500 Gross per 24 hour  Intake 2265 ml  Output 700 ml  Net 1565 ml    Filed Weights   08/26/22 0700 08/27/22 0607 08/29/22 1228  Weight: 68.2 kg 64.4 kg 64.4 kg    Examination:  General exam: NAD. Respiratory system: CTA B.  No wheezes, no crackles, no rhonchi.  Fair air movement.  Speaking in full sentences.  Cardiovascular system: RRR no murmurs rubs or gallops.  No JVD.  1-2+ bilateral  lower extremity edema.  Gastrointestinal system: Abdomen is soft, mildly distended, nontender to palpation, positive bowel sounds.  No rebound.  No guarding.   Central nervous system: Alert and oriented. No focal neurological deficits. Extremities: Symmetric 5 x 5 power. Skin: No rashes, lesions or ulcers Psychiatry: Judgement and insight appear normal. Mood & affect appropriate.     Data Reviewed: I  have personally reviewed following labs and imaging studies  CBC: Recent Labs  Lab 08/24/22 1029 08/25/22 0529 08/26/22 1258 08/27/22 0706 08/28/22 0602 08/28/22 1339 08/29/22 0623 08/30/22 0600  WBC 6.2   < > 6.6 3.9* 3.5*  --  4.7 5.3  NEUTROABS 3.8  --   --   --  1.9  --  2.8 4.2  HGB 9.1*   < > 9.3* 7.8* 7.0* 8.4* 7.3* 8.7*  HCT 27.9*   < > 29.7* 25.7* 22.5* 26.8* 23.7* 27.2*  MCV 98.2   < > 101.4* 102.8* 100.0  --  99.2 96.8  PLT 145*   < > 142* 96* 89*  --  98* 101*   < > = values in this interval not displayed.     Basic Metabolic Panel: Recent Labs  Lab 08/24/22 1029 08/25/22 0529 08/25/22 0644 08/26/22 1258 08/27/22 0706 08/28/22 0602 08/29/22 0623 08/30/22 0600  NA 137   < >  --  137 139 138 137 137  K 3.4*   < >  --  4.3 3.6 3.5 3.7 4.4  CL 105   < >  --  106 108 108 109 109  CO2 23   < >  --  23 26 24 23  21*  GLUCOSE 95   < >  --  108* 95 90 104* 147*  BUN 30*   < >  --  18 17 20  25* 28*  CREATININE 1.14*   < >  --  1.39* 1.58* 1.73* 1.65* 1.55*  CALCIUM 8.9   < >  --  8.9 8.3* 8.7* 8.7* 8.7*  MG 1.2*  --  1.8  --   --  1.3* 2.4 2.0   < > = values in this interval not displayed.     GFR: Estimated Creatinine Clearance: 23.3 mL/min (A) (by C-G formula based on SCr of 1.55 mg/dL (H)).  Liver Function Tests: Recent Labs  Lab 08/24/22 1029 08/25/22 0529 08/26/22 1258  AST 42* 38 67*  ALT 26 23 33  ALKPHOS 97 85 137*  BILITOT 1.3* 0.8 0.9  PROT 6.4* 5.2* 6.7  ALBUMIN 2.3* 1.9* 2.4*     CBG: Recent Labs  Lab 08/29/22 1925 08/29/22 2342 08/30/22 0455 08/30/22 0743 08/30/22 1207  GLUCAP 101* 130* 149* 135* 255*      Recent Results (from the past 240 hour(s))  Aerobic/Anaerobic Culture w Gram Stain (surgical/deep wound)     Status: None (Preliminary result)   Collection Time: 08/25/22 12:36 PM   Specimen: Abscess  Result Value Ref Range Status   Specimen Description   Final    ABSCESS RIGHT HIP Performed at [place] [address] [place], [place] [address]    Special Requests   Final    NONE Performed at [facility], [place] [address] [place], [place] [address]    Gram Stain   Final    MODERATE WBC PRESENT, PREDOMINANTLY PMN NO ORGANISMS SEEN Performed at [facility], [place] [address] [place], [place] [address]    Culture   Final    RARE  STAPHYLOCOCCUS CAPRAE SUSCEPTIBILITIES TO FOLLOW NO ANAEROBES ISOLATED; CULTURE IN PROGRESS FOR 5 DAYS    Report Status PENDING  Incomplete  Surgical pcr screen     Status: None   Collection Time: 08/29/22 12:54 AM   Specimen: Nasal Mucosa; Nasal Swab  Result Value Ref Range Status   MRSA, PCR NEGATIVE NEGATIVE Final   Staphylococcus aureus NEGATIVE NEGATIVE Final    Comment: (NOTE) The Xpert SA Assay (FDA approved for NASAL specimens in patients 83 years of age and older), is one component of a comprehensive surveillance program. It is not intended to diagnose infection nor to guide or monitor treatment. Performed at [facility], [place] [address] [place], [place] [address]   Aerobic/Anaerobic Culture w Gram Stain (surgical/deep wound)     Status: None (Preliminary result)   Collection Time: 08/29/22  6:08 PM   Specimen: PATH Other; Tissue  Result Value Ref Range Status   Specimen Description   Final    TISSUE RIGHT HIP 1 Performed at [place] [address] [place], [place] [address]    Special Requests   Final    NONE Performed at [facility], [place] [address], [place], [place] [address]    Gram Stain NO WBC SEEN NO ORGANISMS SEEN   Final   Culture   Final    NO GROWTH < 12 HOURS Performed at [facility], [place] [address], [place], [place] [address]    Report Status PENDING  Incomplete  Aerobic/Anaerobic Culture w Gram Stain (surgical/deep wound)     Status: None (Preliminary result)   Collection Time: 08/29/22  6:17 PM   Specimen: PATH  Other; Tissue  Result Value Ref Range Status   Specimen Description   Final    TISSUE RIGHT HIP 2 Performed at [facility], [place] [address], [place], [place] [address]    Special Requests   Final    NONE Performed at [facility], [place] [address] [place], [place] [address]    Gram Stain NO WBC SEEN NO ORGANISMS SEEN   Final   Culture   Final    NO GROWTH < 12 HOURS Performed at [facility], [place] [address], [place], [place] [address]    Report Status PENDING  Incomplete  Aerobic/Anaerobic Culture w Gram Stain (surgical/deep wound)     Status: None (Preliminary result)   Collection Time: 08/29/22  6:18 PM   Specimen: PATH Other; Tissue  Result Value Ref Range Status   Specimen Description   Final    TISSUE RIGHT HIP 3 Performed at [facility], [place] [address], [place], [place] [address]    Special Requests   Final    NONE Performed at [facility], [place] [address], [place], [place] [address]    Gram Stain NO WBC SEEN NO ORGANISMS SEEN   Final   Culture   Final    NO GROWTH < 12 HOURS Performed at [facility], [place] [address], [place], [place] [address]    Report Status PENDING  Incomplete         Radiology Studies: DG HIP UNILAT W OR W/O PELVIS 2-3 VIEWS RIGHT  Result Date: 08/29/2022 CLINICAL DATA:  Status post right hip replacement EXAM: DG HIP (WITH OR WITHOUT PELVIS) 3V RIGHT COMPARISON:  08/22/2022 FINDINGS: Right hip replacement is noted. Pelvic ring is intact. Surgical wound VAC is noted on the right. Pelvic ring is  intact. IMPRESSION: Changes consistent with the known right hip replacement. No acute abnormality noted. Electronically Signed   By: Inez Catalina M.D.   On: 08/29/2022 20:15        Scheduled Meds:  sodium chloride   Intravenous Once   acetaminophen  650 mg Oral Once   allopurinol  100 mg Oral Daily   aspirin EC  81 mg Oral BID   Chlorhexidine Gluconate  Cloth  6 each Topical Daily   diphenhydrAMINE  25 mg Oral Once   ezetimibe  10 mg Oral Daily   furosemide  20 mg Intravenous Once   gabapentin  300 mg Oral TID   magnesium oxide  400 mg Oral Daily   metoprolol succinate  25 mg Oral Daily   polyethylene glycol  17 g Oral BID   Ensure Max Protein  11 oz Oral BID   rosuvastatin  5 mg Oral Q M,W,F   sodium chloride flush  10-40 mL Intracatheter Q12H   Continuous Infusions:  DAPTOmycin (CUBICIN) 500 mg in sodium chloride 0.9 % IVPB 500 mg (08/29/22 1159)     LOS: 6 days    Time spent: 35 minutes    Irine Seal, MD [facility]   To contact the attending provider between 7A-7P or the covering provider during after hours 7P-7A, please log into the web site [email or web address] and access using universal  password for that web site. If you do not have the password, please call the hospital operator.  08/30/2022, 12:26 PM

## 2022-08-30 NOTE — Progress Notes (Signed)
Physical Therapy Treatment Patient Details Name: Kayla Baker MRN: [id number] DOB: 1936/10/28 Today's Date: 08/30/2022   History of Present Illness 86 year old married female with PMH of ovarian cancer with peritoneal carcinomatosis and fluid retention secondary to third spacing, anemia in neoplastic disease, CKD stage III, type II DM, GERD, gout, HLD, HTN, s/p right hip hemiarthroplasty 11/08/2021, presented to the ED on 08/22/2022 with complaints of abdominal distention, pain and pressure across her abdomen, nausea, decreased appetite and also with right hip pain with decreased mobility.  9/25: IR placed Drain in right hip fluid collection. S/p I and D and revision femoral head component and VAC placed.    PT Comments    Patiwnt progressing with ambulation. Provided written posterior precaution list and reviewed with patient and family at bedside. Continue PT.   Recommendations for follow up therapy are one component of a multi-disciplinary discharge planning process, led by the attending physician.  Recommendations may be updated based on patient status, additional functional criteria and insurance authorization.  Follow Up Recommendations  Follow physician's recommendations for discharge plan and follow up therapies     Assistance Recommended at Discharge Intermittent Supervision/Assistance  Patient can return home with the following A little help with walking and/or transfers;A little help with bathing/dressing/bathroom;Assistance with cooking/housework;Assist for transportation;Help with stairs or ramp for entrance   Equipment Recommendations  None recommended by PT    Recommendations for Other Services       Precautions / Restrictions Precautions Precautions: Fall;Posterior Hip Precaution Comments: right hip VAC Restrictions Other Position/Activity Restrictions: WBAT     Mobility  Bed Mobility   Bed Mobility: Sit to Supine     Supine to sit: Min assist Sit to supine: Min  assist   General bed mobility comments: patient asking spouse to lift legs onto bed,    Transfers Overall transfer level: Needs assistance Equipment used: Rolling walker (2 wheels) Transfers: Sit to/from Stand Sit to Stand: Min guard           General transfer comment: cues for  hip precaution    Ambulation/Gait Ambulation/Gait assistance: Min guard Gait Distance (Feet): 110 Feet Assistive device: Rolling walker (2 wheels) Gait Pattern/deviations: Decreased stride length, Step-through pattern, Antalgic       General Gait Details: patient tolerated well,   Stairs             Wheelchair Mobility    Modified Rankin (Stroke Patients Only)       Balance Overall balance assessment: Mild deficits observed, not formally tested                                          Cognition Arousal/Alertness: Awake/alert Behavior During Therapy: WFL for tasks assessed/performed Overall Cognitive Status: Within Functional Limits for tasks assessed                                          Exercises General Exercises - Lower Extremity Ankle Circles/Pumps: AROM, Both, 10 reps Quad Sets: AROM, Both, 10 reps    General Comments        Pertinent Vitals/Pain Pain Assessment Pain Assessment: 0-10 Pain Score: 6  Pain Location: right hip Pain Descriptors / Indicators: Discomfort Pain Intervention(s): Ice applied, Monitored during session    Home Living Family/patient expects to be discharged  to:: Private residence Living Arrangements: Spouse/significant other Available Help at Discharge: Family Type of Home: House Home Access: Stairs to enter Entrance Stairs-Rails: Right Entrance Stairs-Number of Steps: 2   Home Layout: One level Home Equipment: Conservation officer, nature (2 wheels)      Prior Function            PT Goals (current goals can now be found in the care plan section) Acute Rehab PT Goals Patient Stated Goal: go home PT Goal  Formulation: With patient/family Time For Goal Achievement: 09/06/22 Potential to Achieve Goals: Good Progress towards PT goals: Progressing toward goals    Frequency    Min 6X/week      PT Plan Current plan remains appropriate    Co-evaluation              AM-PAC PT "6 Clicks" Mobility   Outcome Measure  Help needed turning from your back to your side while in a flat bed without using bedrails?: A Little Help needed moving from lying on your back to sitting on the side of a flat bed without using bedrails?: A Little Help needed moving to and from a bed to a chair (including a wheelchair)?: A Little Help needed standing up from a chair using your arms (e.g., wheelchair or bedside chair)?: A Little Help needed to walk in hospital room?: A Little Help needed climbing 3-5 steps with a railing? : A Little 6 Click Score: 18    End of Session Equipment Utilized During Treatment: Gait belt Activity Tolerance: Patient tolerated treatment well Patient left: in bed;with call bell/phone within reach;with bed alarm set;with family/visitor present Nurse Communication: Mobility status PT Visit Diagnosis: Difficulty in walking, not elsewhere classified (R26.2);Pain Pain - Right/Left: Right Pain - part of body: Hip     Time: 8453-6468 PT Time Calculation (min) (ACUTE ONLY): 26 min  Charges:  $Gait Training: 23-37 mins                     [place] Office [phone] Weekend [phone]    Claretha Cooper 08/30/2022, 3:15 PM

## 2022-08-30 NOTE — Evaluation (Addendum)
Physical Therapy Evaluation Patient Details Name: Kayla Baker MRN: [id number] DOB: 1936-08-23 Today's Date: 08/30/2022  History of Present Illness  86 year old married female with PMH of ovarian cancer with peritoneal carcinomatosis and fluid retention secondary to third spacing, anemia in neoplastic disease, CKD stage III, type II DM, GERD, gout, HLD, HTN, s/p right hip hemiarthroplasty 11/08/2021, presented to the ED on 08/22/2022 with complaints of abdominal distention, pain and pressure across her abdomen, nausea, decreased appetite and also with right hip pain with decreased mobility.  9/25: IR placed Drain in right hip fluid collection. S/p I and D and revision femoral hear component and VAC placed.  Clinical Impression  Patient S/P I and D and femoral head revision on 08/29/22. The patient tolerated very well. Patient ambulated x 60', limited by [facility] not being battery backup charged.  Patient spouse present. Reviewed posterior hip precautions and VAC  use , defer to MD for further questions re: VAC.  Patient should progress to Dc home.       Recommendations for follow up therapy are one component of a multi-disciplinary discharge planning process, led by the attending physician.  Recommendations may be updated based on patient status, additional functional criteria and insurance authorization.  Follow Up Recommendations Home health PT      Assistance Recommended at Discharge Intermittent Supervision/Assistance  Patient can return home with the following  A little help with walking and/or transfers;A little help with bathing/dressing/bathroom;Assistance with cooking/housework;Assist for transportation;Help with stairs or ramp for entrance    Equipment Recommendations None recommended by PT  Recommendations for Other Services       Functional Status Assessment Patient has had a recent decline in their functional status and demonstrates the ability to make significant improvements in  function in a reasonable and predictable amount of time.     Precautions / Restrictions Precautions Precautions: Fall;Posterior Hip Precaution Comments: right hip VAC Restrictions Other Position/Activity Restrictions: WBAT      Mobility  Bed Mobility   Bed Mobility: Supine to Sit     Supine to sit: Min assist     General bed mobility comments: spouse assisted pt with LEs onto bed, reviewed posterior  hip precautions with pt and spouse.    Transfers Overall transfer level: Needs assistance Equipment used: Rolling walker (2 wheels) Transfers: Sit to/from Stand Sit to Stand: Min guard           General transfer comment: cues for  hip precaution    Ambulation/Gait Ambulation/Gait assistance: Min guard Gait Distance (Feet): 60 Feet Assistive device: Rolling walker (2 wheels) Gait Pattern/deviations: Decreased stride length, Step-through pattern, Antalgic       General Gait Details: patient tolerated well, VAC battery low so remained plugged up and limited distance.  Stairs            Wheelchair Mobility    Modified Rankin (Stroke Patients Only)       Balance Overall balance assessment: Mild deficits observed, not formally tested                                           Pertinent Vitals/Pain Pain Assessment Pain Assessment: 0-10 Pain Score: 6  Pain Location: right hip Pain Descriptors / Indicators: Discomfort Pain Intervention(s): Monitored during session, Premedicated before session    Home Living Family/patient expects to be discharged to:: Private residence Living Arrangements: Spouse/significant other Available Help at  Discharge: Family Type of Home: House Home Access: Stairs to enter Entrance Stairs-Rails: Right Entrance Stairs-Number of Steps: 2   Home Layout: One level Home Equipment: Conservation officer, nature (2 wheels)      Prior Function Prior Level of Function : Independent/Modified Independent              Mobility Comments: indep with mobility, with Rw, spouse assisted  legs in and out of bed ADLs Comments: spouse assisted with ADL's since hip pain     Hand Dominance        Extremity/Trunk Assessment    Able to move right leg in bed, and bear weight            Communication   Communication: No difficulties  Cognition Arousal/Alertness: Awake/alert Behavior During Therapy: WFL for tasks assessed/performed Overall Cognitive Status: Within Functional Limits for tasks assessed                                          General Comments      Exercises  LAQ seated x 10 both legs   Assessment/Plan Continue PT   PT Assessment  Patient requires further PT  PT Problem List  Difficulty walking, pain       PT Treatment Interventions   Gait training, precautions, family education   PT Goals (Current goals can be found in the Care Plan section)  Acute Rehab PT Goals Patient Stated Goal: go home PT Goal Formulation: With patient/family Time For Goal Achievement: 09/06/22 Potential to Achieve Goals: Good    Frequency Min 6X/week     Co-evaluation               AM-PAC PT "6 Clicks" Mobility  Outcome Measure Help needed turning from your back to your side while in a flat bed without using bedrails?: A Little Help needed moving from lying on your back to sitting on the side of a flat bed without using bedrails?: A Little Help needed moving to and from a bed to a chair (including a wheelchair)?: A Little Help needed standing up from a chair using your arms (e.g., wheelchair or bedside chair)?: A Little Help needed to walk in hospital room?: A Little Help needed climbing 3-5 steps with a railing? : A Lot 6 Click Score: 17    End of Session Equipment Utilized During Treatment: Gait belt Activity Tolerance: Patient tolerated treatment well Patient left: in chair;with call bell/phone within reach;with family/visitor present Nurse Communication:  Mobility status PT Visit Diagnosis: Difficulty in walking, not elsewhere classified (R26.2);Pain Pain - Right/Left: Right Pain - part of body: Hip    Time: 6195-0932 PT Time Calculation (min) (ACUTE ONLY): 37 min   Charges:   PT Evaluation $PT Re-evaluation: 1 Re-eval PT Treatments $Gait Training: 8-22 mins        [place] Office [phone] Weekend [phone]   Claretha Cooper 08/30/2022, 1:39 PM

## 2022-08-30 NOTE — Progress Notes (Signed)
Subjective: 1 Day Post-Op Procedure(s) (LRB): incision and drainage hip head exchange (Right) Patient reports pain as mild.    Objective: Vital signs in last 24 hours: Temp:  [97.1 F (36.2 C)-98.3 F (36.8 C)] 98 F (36.7 C) (09/30 0452) Pulse Rate:  [58-79] 72 (09/30 0452) Resp:  [13-20] 16 (09/30 0452) BP: (112-147)/(64-74) 112/69 (09/30 0452) SpO2:  [91 %-100 %] 94 % (09/30 0452) Weight:  [64.4 kg] 64.4 kg (09/29 1228)  Intake/Output from previous day: 09/29 0701 - 09/30 0700 In: 2265 [I.V.:1650; Blood:315; IV Piggyback:300] Out: 700 [Urine:400; Blood:300] Intake/Output this shift: No intake/output data recorded.  Recent Labs    08/28/22 0602 08/28/22 1339 08/29/22 0623 08/30/22 0600  HGB 7.0* 8.4* 7.3* 8.7*   Recent Labs    08/29/22 0623 08/30/22 0600  WBC 4.7 5.3  RBC 2.39* 2.81*  HCT 23.7* 27.2*  PLT 98* 101*   Recent Labs    08/29/22 0623 08/30/22 0600  NA 137 137  K 3.7 4.4  CL 109 109  CO2 23 21*  BUN 25* 28*  CREATININE 1.65* 1.55*  GLUCOSE 104* 147*  CALCIUM 8.7* 8.7*   No results for input(s): "LABPT", "INR" in the last 72 hours.  Neurovascular intact Sensation intact distally Intact pulses distally Dorsiflexion/Plantar flexion intact Incision: wound vac in place and functioning properly No cellulitis present   Assessment/Plan: 1 Day Post-Op Procedure(s) (LRB): incision and drainage hip head exchange (Right) Up with therapy Continue ABX therapy due to Culture taken of surgical site during joint revision WBAT RLE Intraop cx still pending, no growth or organisims Pain control as ordered ASA dvt proph Will cont to follow     Chriss Czar 08/30/2022, 8:14 AM

## 2022-08-31 DIAGNOSIS — L02419 Cutaneous abscess of limb, unspecified: Secondary | ICD-10-CM | POA: Diagnosis not present

## 2022-08-31 DIAGNOSIS — N390 Urinary tract infection, site not specified: Secondary | ICD-10-CM | POA: Diagnosis not present

## 2022-08-31 DIAGNOSIS — E1122 Type 2 diabetes mellitus with diabetic chronic kidney disease: Secondary | ICD-10-CM | POA: Diagnosis not present

## 2022-08-31 DIAGNOSIS — I10 Essential (primary) hypertension: Secondary | ICD-10-CM | POA: Diagnosis not present

## 2022-08-31 LAB — BASIC METABOLIC PANEL
Anion gap: 4 — ABNORMAL LOW (ref 5–15)
BUN: 39 mg/dL — ABNORMAL HIGH (ref 8–23)
CO2: 23 mmol/L (ref 22–32)
Calcium: 8.6 mg/dL — ABNORMAL LOW (ref 8.9–10.3)
Chloride: 111 mmol/L (ref 98–111)
Creatinine, Ser: 1.65 mg/dL — ABNORMAL HIGH (ref 0.44–1.00)
GFR, Estimated: 30 mL/min — ABNORMAL LOW (ref >60)
Glucose, Bld: 153 mg/dL — ABNORMAL HIGH (ref 70–99)
Potassium: 4 mmol/L (ref 3.5–5.1)
Sodium: 138 mmol/L (ref 135–145)

## 2022-08-31 LAB — AEROBIC/ANAEROBIC CULTURE W GRAM STAIN (SURGICAL/DEEP WOUND)

## 2022-08-31 LAB — CBC WITH DIFFERENTIAL/PLATELET
Abs Immature Granulocytes: 0.05 10*3/uL (ref 0.00–0.07)
Basophils Absolute: 0 10*3/uL (ref 0.0–0.1)
Basophils Relative: 0 %
Eosinophils Absolute: 0 10*3/uL (ref 0.0–0.5)
Eosinophils Relative: 0 %
HCT: 22.9 % — ABNORMAL LOW (ref 36.0–46.0)
Hemoglobin: 7.2 g/dL — ABNORMAL LOW (ref 12.0–15.0)
Immature Granulocytes: 0 %
Lymphocytes Relative: 13 %
Lymphs Abs: 1.4 10*3/uL (ref 0.7–4.0)
MCH: 31.4 pg (ref 26.0–34.0)
MCHC: 31.4 g/dL (ref 30.0–36.0)
MCV: 100 fL (ref 80.0–100.0)
Monocytes Absolute: 0.7 10*3/uL (ref 0.1–1.0)
Monocytes Relative: 6 %
Neutro Abs: 9.1 10*3/uL — ABNORMAL HIGH (ref 1.7–7.7)
Neutrophils Relative %: 81 %
Platelets: 99 10*3/uL — ABNORMAL LOW (ref 150–400)
RBC: 2.29 MIL/uL — ABNORMAL LOW (ref 3.87–5.11)
RDW: 18 % — ABNORMAL HIGH (ref 11.5–15.5)
WBC: 11.3 10*3/uL — ABNORMAL HIGH (ref 4.0–10.5)
nRBC: 0 % (ref 0.0–0.2)

## 2022-08-31 LAB — CBC
HCT: 24 % — ABNORMAL LOW (ref 36.0–46.0)
Hemoglobin: 7.6 g/dL — ABNORMAL LOW (ref 12.0–15.0)
MCH: 31.5 pg (ref 26.0–34.0)
MCHC: 31.7 g/dL (ref 30.0–36.0)
MCV: 99.6 fL (ref 80.0–100.0)
Platelets: 101 10*3/uL — ABNORMAL LOW (ref 150–400)
RBC: 2.41 MIL/uL — ABNORMAL LOW (ref 3.87–5.11)
RDW: 17.9 % — ABNORMAL HIGH (ref 11.5–15.5)
WBC: 12 10*3/uL — ABNORMAL HIGH (ref 4.0–10.5)
nRBC: 0 % (ref 0.0–0.2)

## 2022-08-31 LAB — GLUCOSE, CAPILLARY
Glucose-Capillary: 115 mg/dL — ABNORMAL HIGH (ref 70–99)
Glucose-Capillary: 137 mg/dL — ABNORMAL HIGH (ref 70–99)
Glucose-Capillary: 164 mg/dL — ABNORMAL HIGH (ref 70–99)
Glucose-Capillary: 188 mg/dL — ABNORMAL HIGH (ref 70–99)

## 2022-08-31 MED ORDER — PANTOPRAZOLE SODIUM 40 MG IV SOLR
40.0000 mg | INTRAVENOUS | Status: DC
  Administered 2022-08-31: 40 mg via INTRAVENOUS
  Filled 2022-08-31: qty 10

## 2022-08-31 NOTE — Progress Notes (Signed)
Subjective: Patient reports pain as mild to moderate.  Tolerating diet.  Urinating.   No CP, SOB.  Has mobilized some OOB with PT/OT.  Objective:   VITALS:   Vitals:   08/30/22 1338 08/30/22 2128 08/31/22 0500 08/31/22 0543  BP: (!) 98/49 (!) 108/49  (!) 122/59  Pulse: 90 89  86  Resp: 20 17  18   Temp: 97.7 F (36.5 C) 97.8 F (36.6 C)  97.9 F (36.6 C)  TempSrc: Oral Oral  Oral  SpO2: 98% 97%  93%  Weight:   69.1 kg   Height:          Latest Ref Rng & Units 08/31/2022    5:31 AM 08/30/2022    6:00 AM 08/29/2022    6:23 AM  CBC  WBC 4.0 - 10.5 K/uL 11.3  5.3  4.7   Hemoglobin 12.0 - 15.0 g/dL 7.2  8.7  7.3   Hematocrit 36.0 - 46.0 % 22.9  27.2  23.7   Platelets 150 - 400 K/uL 99  101  98       Latest Ref Rng & Units 08/31/2022    5:31 AM 08/30/2022    6:00 AM 08/29/2022    6:23 AM  BMP  Glucose 70 - 99 mg/dL 153  147  104   BUN 8 - 23 mg/dL 39  28  25   Creatinine 0.44 - 1.00 mg/dL 1.65  1.55  1.65   Sodium 135 - 145 mmol/L 138  137  137   Potassium 3.5 - 5.1 mmol/L 4.0  4.4  3.7   Chloride 98 - 111 mmol/L 111  109  109   CO2 22 - 32 mmol/L 23  21  23    Calcium 8.9 - 10.3 mg/dL 8.6  8.7  8.7    Intake/Output      09/30 0701 10/01 0700 10/01 0701 10/02 0700   P.O. 120 240   I.V. (mL/kg)     Blood     IV Piggyback     Total Intake(mL/kg) 120 (1.7) 240 (3.5)   Urine (mL/kg/hr)     Blood     Total Output     Net +120 +240        Urine Occurrence 2 x    Stool Occurrence 1 x       Physical Exam: General: NAD.  Sitting up in bed eating lunch Resp: No increased wob Cardio: regular rate and rhythm ABD soft Neurologically intact MSK Neurovascularly intact Sensation intact distally Intact pulses distally Dorsiflexion/Plantar flexion intact Incision: dressing C/D/I Wound vac on and functioning with nothing in the canister or line   Assessment: 2 Days Post-Op  S/P Procedure(s) (LRB): incision and drainage hip head exchange (Right) by Dr.  Zachery Dakins on 08/29/22  Principal Problem:   Abscess of hip Active Problems:   Essential hypertension   Hyperlipidemia associated with type 2 diabetes mellitus (HCC)   DM2 (diabetes mellitus, type 2) (HCC)   Gout   Ovarian cancer ([place])   Hypomagnesemia   Stage 3b chronic kidney disease (CKD) (HCC)   Acute UTI (urinary tract infection)   Hypokalemia   Hyperbilirubinemia   Normocytic anemia   Thrombocytopenia (HCC)   Protein-calorie malnutrition, severe (HCC)   Right hip pain   Peritoneal carcinomatosis (HCC)   Urinary tract infection without hematuria   Pancytopenia (HCC)   Malnutrition of moderate degree   Prosthetic hip infection ([place])   Plan: Cultures continue to show NGTD  H/H dropped today  7.2/22.9 Advance diet Up with therapy Incentive Spirometry Elevate and Apply ice  Weightbearing: WBAT RLE, post hip precautions x 6 weeks Insicional and dressing care: Dressings left intact until follow-up and Reinforce dressings as needed Orthopedic device(s): Wound IPJ:ASNK in place until office f/u Showering: Keep dressing dry VTE prophylaxis: Aspirin 53m BID    , SCDs, ambulation Pain control: continue current regimen Follow - up plan: 1 week Contact information:  TEdmonia LynchMD, MAggie MoatsPA-C  Dispo:  TBD  based on PT/OT evals.      MBritt Bottom PA-C Office [phone] 905210/12/2021, 11:57 AM

## 2022-08-31 NOTE — Anesthesia Postprocedure Evaluation (Signed)
Anesthesia Post Note  Patient: Kayla Baker  Procedure(s) Performed: incision and drainage hip head exchange (Right: Hip)     Patient location during evaluation: PACU Anesthesia Type: General Level of consciousness: awake and alert Pain management: pain level controlled Vital Signs Assessment: post-procedure vital signs reviewed and stable Respiratory status: spontaneous breathing, nonlabored ventilation, respiratory function stable and patient connected to nasal cannula oxygen Cardiovascular status: blood pressure returned to baseline and stable Postop Assessment: no apparent nausea or vomiting Anesthetic complications: no   No notable events documented.  Last Vitals:  Vitals:   08/30/22 2128 08/31/22 0543  BP: (!) 108/49 (!) 122/59  Pulse: 89 86  Resp: 17 18  Temp: 36.6 C 36.6 C  SpO2: 97% 93%    Last Pain:  Vitals:   08/31/22 0543  TempSrc: Oral  PainSc:                  [name]

## 2022-08-31 NOTE — Progress Notes (Signed)
Physical Therapy Treatment Patient Details Name: Kayla Baker MRN: [id number] DOB: 04/04/1936 Today's Date: 08/31/2022   History of Present Illness 86 year old married female with PMH of ovarian cancer with peritoneal carcinomatosis and fluid retention secondary to third spacing, anemia in neoplastic disease, CKD stage III, type II DM, GERD, gout, HLD, HTN, s/p right hip hemiarthroplasty 11/08/2021, presented to the ED on 08/22/2022 with complaints of abdominal distention, pain and pressure across her abdomen, nausea, decreased appetite and also with right hip pain with decreased mobility.  9/25: IR placed Drain in right hip fluid collection. S/p I and D and revision femoral head component and VAC placed.    PT Comments    Pt very motivated and progressing steadily with mobility.  Pt up to ambulate increased distance in hall.  Therex deferred to later at pt request 2* arrival of bfast.  Will follow.   Recommendations for follow up therapy are one component of a multi-disciplinary discharge planning process, led by the attending physician.  Recommendations may be updated based on patient status, additional functional criteria and insurance authorization.  Follow Up Recommendations  Follow physician's recommendations for discharge plan and follow up therapies     Assistance Recommended at Discharge Intermittent Supervision/Assistance  Patient can return home with the following A little help with walking and/or transfers;A little help with bathing/dressing/bathroom;Assistance with cooking/housework;Assist for transportation;Help with stairs or ramp for entrance   Equipment Recommendations  None recommended by PT    Recommendations for Other Services       Precautions / Restrictions Precautions Precautions: Fall;Posterior Hip Precaution Booklet Issued: Yes (comment) Precaution Comments: right hip VAC; Pt recalls no THP - all precautions reviewed Restrictions Weight Bearing Restrictions:  No Other Position/Activity Restrictions: WBAT     Mobility  Bed Mobility               General bed mobility comments: Pt up in chair and requests back to same    Transfers Overall transfer level: Needs assistance Equipment used: Rolling walker (2 wheels) Transfers: Sit to/from Stand Sit to Stand: Min guard           General transfer comment: cues for  hip precaution    Ambulation/Gait Ambulation/Gait assistance: Min guard Gait Distance (Feet): 200 Feet Assistive device: Rolling walker (2 wheels) Gait Pattern/deviations: Decreased stride length, Step-through pattern, Antalgic, Trunk flexed       General Gait Details: cues for posture, position from RW and initial sequence   Stairs             Wheelchair Mobility    Modified Rankin (Stroke Patients Only)       Balance Overall balance assessment: Mild deficits observed, not formally tested                                          Cognition Arousal/Alertness: Awake/alert Behavior During Therapy: WFL for tasks assessed/performed Overall Cognitive Status: Within Functional Limits for tasks assessed                                          Exercises      General Comments        Pertinent Vitals/Pain Pain Assessment Pain Assessment: 0-10 Pain Score: 5  Pain Location: right hip Pain Descriptors / Indicators: Aching, Sore Pain  Intervention(s): Limited activity within patient's tolerance, Monitored during session    Home Living                          Prior Function            PT Goals (current goals can now be found in the care plan section) Acute Rehab PT Goals Patient Stated Goal: go home PT Goal Formulation: With patient/family Time For Goal Achievement: 09/06/22 Potential to Achieve Goals: Good Progress towards PT goals: Progressing toward goals    Frequency    Min 6X/week      PT Plan Current plan remains appropriate     Co-evaluation              AM-PAC PT "6 Clicks" Mobility   Outcome Measure  Help needed turning from your back to your side while in a flat bed without using bedrails?: A Little Help needed moving from lying on your back to sitting on the side of a flat bed without using bedrails?: A Little Help needed moving to and from a bed to a chair (including a wheelchair)?: A Little Help needed standing up from a chair using your arms (e.g., wheelchair or bedside chair)?: A Little Help needed to walk in hospital room?: A Little Help needed climbing 3-5 steps with a railing? : A Little 6 Click Score: 18    End of Session Equipment Utilized During Treatment: Gait belt Activity Tolerance: Patient tolerated treatment well Patient left: in chair;with call bell/phone within reach;with family/visitor present Nurse Communication: Mobility status PT Visit Diagnosis: Difficulty in walking, not elsewhere classified (R26.2);Pain Pain - Right/Left: Right Pain - part of body: Hip     Time: 9444-6190 PT Time Calculation (min) (ACUTE ONLY): 21 min  Charges:  $Gait Training: 8-22 mins                     [place] Pager [phone] Office [phone]    Kayla Baker 08/31/2022, 9:09 AM

## 2022-08-31 NOTE — Progress Notes (Signed)
PROGRESS NOTE    Kayla Baker  WJX:[id number] DOB: Aug 21, 1936 DOA: 08/24/2022 PCP: [name], MD    Chief Complaint  Patient presents with   Abdominal Pain    Brief Narrative:  86 year old married female with PMH of ovarian cancer with peritoneal carcinomatosis and fluid retention secondary to third spacing, anemia in neoplastic disease, CKD stage III, type II DM, GERD, gout, HLD, HTN, s/p right hip hemiarthroplasty 11/08/2021, presented to the ED on 08/22/2022 with complaints of abdominal distention, pain and pressure across her abdomen, nausea, decreased appetite and also with right hip pain with decreased mobility.  Dysuria, being treated for UTI with amoxicillin.  Admitted for acute cystitis, right hip pain with imaging showing right gluteal fluid collection.  Started on IV antibiotics.  Orthopedics consulted, who had IR aspirate and placed drain in the gluteal fluid.   Assessment & Plan:   Principal Problem:   Abscess of hip Active Problems:   Prosthetic hip infection ([place])   Essential hypertension   Hyperlipidemia associated with type 2 diabetes mellitus (HCC)   DM2 (diabetes mellitus, type 2) (HCC)   Gout   Ovarian cancer ([place])   Hypomagnesemia   Stage 3b chronic kidney disease (CKD) (HCC)   Acute UTI (urinary tract infection)   Hypokalemia   Hyperbilirubinemia   Normocytic anemia   Thrombocytopenia (HCC)   Protein-calorie malnutrition, severe (HCC)   Right hip pain   Peritoneal carcinomatosis (HCC)   Urinary tract infection without hematuria   Pancytopenia (HCC)   Malnutrition of moderate degree  #1 acute UTI/acute cystitis without hematuria -Patient noted to have a urine culture from 08/05/2022 with a pansensitive E. coli. -Patient noted to have been treated appropriately in the outpatient setting with amoxicillin. -Status post 3 days IV Unasyn.    2.  Right hip pain, status post previous right hip hemiarthroplasty for fracture/right gluteal fluid collection  seen on CT/probable prosthetic hip infection -Patient seen in consultation by orthopedics, IR consulted and fluid aspirated 08/25/2022 with Gram stain showing moderate WBC predominantly neutrophils, no organisms, cultures and cytology pending. -Drain currently in place with serosanguineous fluid noted. -Patient states still with pain on ambulation. -Currently on IV Unasyn with preliminary cultures pending with rare Staphylococcus Caprae. -ID consulted, patient seen in consultation by Dr. Tommy Medal this morning 08/28/2022 who reviewed imaging, assessed patient and concern for right-sided prosthetic hip infection. -Dr. Tommy Medal, ID relayed his thoughts to orthopedics, Dr.Marchwiany and patient taken to the OR, 08/29/2022, s/p I&D and intraoperative cultures. -Patient with clinical improvement. -PT/OT. -ID currently recommending probable 6 weeks of targeted IV antibiotics followed by oral antibiotics for total of 6 months versus a year. -IV Unasyn changed to IV daptomycin per ID. -ID, orthopedics following and appreciate input and recommendations.  3.  History of ovarian cancer, peritoneal carcinomatosis with ascites -HCTZ discontinued and patient started on low-dose Lasix and Aldactone. -Currently on a low-salt diet. -Fluid restriction. -Patient noted to have had paracentesis in the past however currently stable and no need for paracentesis at this time. -Patient seen by primary oncologist, Dr. [name], patient noted to be hypoalbuminemic with third spacing and due to current infection chemotherapy being postponed to next week. -It is noted that Dr. [name] has discussed extensively with patient and family multiple times at the cancer center regarding goals of care and wish to continue aggressive therapy at this time. -Status post IV albumin x1 08/27/2022.   -Patient with bump in creatinine and as such Lasix and Aldactone held which we  will continue to do.  -Outpatient follow-up with oncology.  4.   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 -Status post IV albumin x1. -Patient with bump in creatinine and as such diuretics of Aldactone and Lasix held which we will continue to hold for now.  5.  Hypomagnesemia -Magnesium at 2.0 from 2.4 from 1.3. -Repeat labs in the AM.  6.  CKD stage IIIb -Noted to be slightly trending up, patient noted to have CT contrast 08/24/2022, Lasix and Aldactone started on 08/25/2022. -Lasix and Aldactone held with creatinine trending back down and fluctuating.   Currently at 1.65 today.   7.  Hypokalemia -Repleted.   -Potassium at 4.0 this morning.  -Magnesium repleted, currently at 2.0 from 2.4 from 1.3. -Repeat labs in the AM.  8.  Hypertension -Controlled on current regimen of metoprolol 25 mg daily.  -HCTZ discontinued likely will not resume on discharge. -Continue to hold low-dose Aldactone and Lasix due to bump in creatinine.  9.  Hyperlipidemia -Continue rosuvastatin 5 mg 3 times weekly.   10.  Diabetes mellitus type 2 -Hemoglobin A1c 6.2 (06/16/2022). -CBG 137 this morning. -Continue to hold oral hypoglycemic agents. -Follow-up.  11.  Gout -Allopurinol.  12.  Hyperbilirubinemia -Resolved.  13.  Normocytic anemia/anemia in neoplastic disease -Patient with no overt bleeding. -Hemoglobin currently at 7.2 this morning. -Status posttransfusion 1 unit packed red blood cells preoperatively. -Repeat hemoglobin this afternoon and transfuse for hemoglobin < 7.   14.  Pancytopenia -Likely due to recent treatment. -Hemoglobin at 7.2 this morning, status posttransfusion 1 units packed red blood cells 08/29/2022.   -Repeat H&H this afternoon. -Platelet count stable. -Follow.  15.  Severe protein calorie malnutrition -Nutritional supplementation.    DVT prophylaxis: SCDs Code Status: Full Family Communication: Updated patient, husband, niece at bedside.    Disposition: TBD  Status is: Inpatient Remains inpatient appropriate because: Severity of illness.     Consultants:  Orthopedics: Dr. Mardelle Matte 08/24/2022 Oncology: Dr. [name] 08/26/2022 ID: Dr. Tommy Medal 08/28/2022  Procedures:  10 French drain placed in the right hip fluid collection per IR, Dr.Mir 08/25/2022 CT abdomen and pelvis 08/24/2022 Lower extremity Dopplers 08/22/2022 Incision and drainage of right hip with exchange of modular components: Bipolar head ball per Dr. Zachery Dakins 08/29/2022 Transfusion 1 unit packed red blood cells 08/29/2022.  Antimicrobials:  IV Unasyn 08/24/2022>>>> 08/29/2022 IV daptomycin 08/29/2022>>>>   Subjective: Patient sitting up in bed about to eat her lunch.  Denies any chest pain.  No shortness of breath.  No abdominal pain.  Husband and niece at bedside.  Patient does state improvement with right hip pain postoperatively however still with some soreness postop.  Denies any overt bleeding.    Objective: Vitals:   08/30/22 1338 08/30/22 2128 08/31/22 0500 08/31/22 0543  BP: (!) 98/49 (!) 108/49  (!) 122/59  Pulse: 90 89  86  Resp: 20 17  18   Temp: 97.7 F (36.5 C) 97.8 F (36.6 C)  97.9 F (36.6 C)  TempSrc: Oral Oral  Oral  SpO2: 98% 97%  93%  Weight:   69.1 kg   Height:        Intake/Output Summary (Last 24 hours) at 08/31/2022 1234 Last data filed at 08/31/2022 0934 Gross per 24 hour  Intake 360 ml  Output --  Net 360 ml    Filed Weights   08/27/22 0607 08/29/22 1228 08/31/22 0500  Weight: 64.4 kg 64.4 kg 69.1 kg  Examination:  General exam: NAD. Respiratory system: Lungs clear to auscultation bilaterally.  No wheezes, no crackles, no rhonchi.  Fair air movement.  Speaking in full sentences.  Cardiovascular system: Regular rate rhythm no murmurs rubs or gallops.  No JVD.  1-2+ bilateral lower extremity edema. Gastrointestinal system: Abdomen is soft, mildly distended, nontender to palpation, positive bowel sounds.  No rebound.  No  guarding. Right hip/buttocks with wound VAC noted. Central nervous system: Alert and oriented. No focal neurological deficits. Extremities: Symmetric 5 x 5 power. Skin: No rashes, lesions or ulcers Psychiatry: Judgement and insight appear normal. Mood & affect appropriate.     Data Reviewed: I have personally reviewed following labs and imaging studies  CBC: Recent Labs  Lab 08/27/22 0706 08/28/22 0602 08/28/22 1339 08/29/22 0623 08/30/22 0600 08/31/22 0531  WBC 3.9* 3.5*  --  4.7 5.3 11.3*  NEUTROABS  --  1.9  --  2.8 4.2 9.1*  HGB 7.8* 7.0* 8.4* 7.3* 8.7* 7.2*  HCT 25.7* 22.5* 26.8* 23.7* 27.2* 22.9*  MCV 102.8* 100.0  --  99.2 96.8 100.0  PLT 96* 89*  --  98* 101* 99*     Basic Metabolic Panel: Recent Labs  Lab 08/25/22 0644 08/26/22 1258 08/27/22 0706 08/28/22 0602 08/29/22 0623 08/30/22 0600 08/31/22 0531  NA  --    < > 139 138 137 137 138  K  --    < > 3.6 3.5 3.7 4.4 4.0  CL  --    < > 108 108 109 109 111  CO2  --    < > 26 24 23  21* 23  GLUCOSE  --    < > 95 90 104* 147* 153*  BUN  --    < > 17 20 25* 28* 39*  CREATININE  --    < > 1.58* 1.73* 1.65* 1.55* 1.65*  CALCIUM  --    < > 8.3* 8.7* 8.7* 8.7* 8.6*  MG 1.8  --   --  1.3* 2.4 2.0  --    < > = values in this interval not displayed.     GFR: Estimated Creatinine Clearance: 24.2 mL/min (A) (by C-G formula based on SCr of 1.65 mg/dL (H)).  Liver Function Tests: Recent Labs  Lab 08/25/22 0529 08/26/22 1258  AST 38 67*  ALT 23 33  ALKPHOS 85 137*  BILITOT 0.8 0.9  PROT 5.2* 6.7  ALBUMIN 1.9* 2.4*     CBG: Recent Labs  Lab 08/30/22 0743 08/30/22 1207 08/30/22 2338 08/31/22 0546 08/31/22 1152  GLUCAP 135* 255* 183* 137* 164*      Recent Results (from the past 240 hour(s))  Aerobic/Anaerobic Culture w Gram Stain (surgical/deep wound)     Status: None (Preliminary result)   Collection Time: 08/25/22 12:36 PM   Specimen: Abscess  Result Value Ref Range Status   Specimen  Description   Final    ABSCESS RIGHT HIP Performed at [facility] [address] [place], [place] [address]    Special Requests   Final    NONE Performed at [facility], [place] [address] [place], [place] [address]    Gram Stain   Final    MODERATE WBC PRESENT, PREDOMINANTLY PMN NO ORGANISMS SEEN Performed at [facility], [place] [address] [place], [place] [address]    Culture   Final    RARE STAPHYLOCOCCUS CAPRAE SUSCEPTIBILITIES TO FOLLOW NO ANAEROBES ISOLATED; CULTURE IN PROGRESS FOR 5 DAYS  Report Status PENDING  Incomplete  Surgical pcr screen     Status: None   Collection Time: 08/29/22 12:54 AM   Specimen: Nasal Mucosa; Nasal Swab  Result Value Ref Range Status   MRSA, PCR NEGATIVE NEGATIVE Final   Staphylococcus aureus NEGATIVE NEGATIVE Final    Comment: (NOTE) The Xpert SA Assay (FDA approved for NASAL specimens in patients 69 years of age and older), is one component of a comprehensive surveillance program. It is not intended to diagnose infection nor to guide or monitor treatment. Performed at [facility], [place] [address] [place], [place] [address]   Aerobic/Anaerobic Culture w Gram Stain (surgical/deep wound)     Status: None (Preliminary result)   Collection Time: 08/29/22  6:08 PM   Specimen: PATH Other; Tissue  Result Value Ref Range Status   Specimen Description   Final    TISSUE RIGHT HIP 1 Performed at [facility] [address], [place], [place] [address]    Special Requests   Final    NONE Performed at [facility], [place] [address], [place], [place] [address]    Gram Stain NO WBC SEEN NO ORGANISMS SEEN   Final   Culture   Final    NO GROWTH < 12 HOURS Performed at [facility], [place] [address] [place], [place] [address]    Report Status PENDING  Incomplete  Aerobic/Anaerobic Culture w Gram Stain (surgical/deep wound)     Status: None  (Preliminary result)   Collection Time: 08/29/22  6:17 PM   Specimen: PATH Other; Tissue  Result Value Ref Range Status   Specimen Description   Final    TISSUE RIGHT HIP 2 Performed at [facility], [place] [address] [place], [place] [address]    Special Requests   Final    NONE Performed at [facility], [place] [address] [place], [place] [address]    Gram Stain NO WBC SEEN NO ORGANISMS SEEN   Final   Culture   Final    NO GROWTH < 12 HOURS Performed at [facility], [place] [address], [place], [place] [address]    Report Status PENDING  Incomplete  Aerobic/Anaerobic Culture w Gram Stain (surgical/deep wound)     Status: None (Preliminary result)   Collection Time: 08/29/22  6:18 PM   Specimen: PATH Other; Tissue  Result Value Ref Range Status   Specimen Description   Final    TISSUE RIGHT HIP 3 Performed at [facility], [place] [address] [place], [place] [address]    Special Requests   Final    NONE Performed at [facility], [place] [address], [place], [place] [address]    Gram Stain NO WBC SEEN NO ORGANISMS SEEN   Final   Culture   Final    NO GROWTH < 12 HOURS Performed at [facility], [place] [address] [place],  [address]    Report Status PENDING  Incomplete         Radiology Studies: DG HIP UNILAT W OR W/O PELVIS 2-3 VIEWS RIGHT  Result Date: 08/29/2022 CLINICAL DATA:  Status post right hip replacement EXAM: DG HIP (WITH OR WITHOUT PELVIS) 3V RIGHT COMPARISON:  08/22/2022 FINDINGS: Right hip replacement is noted. Pelvic ring is intact. Surgical wound VAC is noted on the right. Pelvic ring is intact. IMPRESSION: Changes consistent with the known right hip replacement. No acute abnormality noted. Electronically Signed  By: Inez Catalina M.D.   On: 08/29/2022 20:15        Scheduled Meds:  sodium chloride   Intravenous Once   acetaminophen  650 mg Oral Once   allopurinol   100 mg Oral Daily   aspirin EC  81 mg Oral BID   Chlorhexidine Gluconate Cloth  6 each Topical Daily   diphenhydrAMINE  25 mg Oral Once   ezetimibe  10 mg Oral Daily   furosemide  20 mg Intravenous Once   gabapentin  300 mg Oral TID   magnesium oxide  400 mg Oral Daily   metoprolol succinate  25 mg Oral Daily   pantoprazole (PROTONIX) IV  40 mg Intravenous Q24H   polyethylene glycol  17 g Oral BID   Ensure Max Protein  11 oz Oral BID   rosuvastatin  5 mg Oral Q M,W,F   sodium chloride flush  10-40 mL Intracatheter Q12H   Continuous Infusions:  DAPTOmycin (CUBICIN) 500 mg in sodium chloride 0.9 % IVPB 500 mg (08/29/22 1159)     LOS: 7 days    Time spent: 35 minutes    Irine Seal, MD [facility]   To contact the attending provider between 7A-7P or the covering provider during after hours 7P-7A, please log into the web site [email or web address] and access using universal [place] password for that web site. If you do not have the password, please call the hospital operator.  08/31/2022, 12:34 PM

## 2022-09-01 ENCOUNTER — Encounter (HOSPITAL_COMMUNITY): Payer: Self-pay | Admitting: Orthopedic Surgery

## 2022-09-01 ENCOUNTER — Inpatient Hospital Stay: Payer: PPO

## 2022-09-01 DIAGNOSIS — C569 Malignant neoplasm of unspecified ovary: Secondary | ICD-10-CM | POA: Diagnosis not present

## 2022-09-01 DIAGNOSIS — L02419 Cutaneous abscess of limb, unspecified: Secondary | ICD-10-CM | POA: Diagnosis not present

## 2022-09-01 DIAGNOSIS — Z881 Allergy status to other antibiotic agents status: Secondary | ICD-10-CM

## 2022-09-01 DIAGNOSIS — N390 Urinary tract infection, site not specified: Secondary | ICD-10-CM | POA: Diagnosis not present

## 2022-09-01 DIAGNOSIS — D61818 Other pancytopenia: Secondary | ICD-10-CM | POA: Diagnosis not present

## 2022-09-01 DIAGNOSIS — R109 Unspecified abdominal pain: Secondary | ICD-10-CM

## 2022-09-01 DIAGNOSIS — E1122 Type 2 diabetes mellitus with diabetic chronic kidney disease: Secondary | ICD-10-CM | POA: Diagnosis not present

## 2022-09-01 LAB — CBC WITH DIFFERENTIAL/PLATELET
Abs Immature Granulocytes: 0.08 10*3/uL — ABNORMAL HIGH (ref 0.00–0.07)
Basophils Absolute: 0 10*3/uL (ref 0.0–0.1)
Basophils Relative: 0 %
Eosinophils Absolute: 0.1 10*3/uL (ref 0.0–0.5)
Eosinophils Relative: 1 %
HCT: 23 % — ABNORMAL LOW (ref 36.0–46.0)
Hemoglobin: 7.1 g/dL — ABNORMAL LOW (ref 12.0–15.0)
Immature Granulocytes: 1 %
Lymphocytes Relative: 21 %
Lymphs Abs: 1.3 10*3/uL (ref 0.7–4.0)
MCH: 30.9 pg (ref 26.0–34.0)
MCHC: 30.9 g/dL (ref 30.0–36.0)
MCV: 100 fL (ref 80.0–100.0)
Monocytes Absolute: 0.4 10*3/uL (ref 0.1–1.0)
Monocytes Relative: 7 %
Neutro Abs: 4.2 10*3/uL (ref 1.7–7.7)
Neutrophils Relative %: 70 %
Platelets: 84 10*3/uL — ABNORMAL LOW (ref 150–400)
RBC: 2.3 MIL/uL — ABNORMAL LOW (ref 3.87–5.11)
RDW: 17.9 % — ABNORMAL HIGH (ref 11.5–15.5)
WBC: 6.1 10*3/uL (ref 4.0–10.5)
nRBC: 0 % (ref 0.0–0.2)

## 2022-09-01 LAB — GLUCOSE, CAPILLARY
Glucose-Capillary: 102 mg/dL — ABNORMAL HIGH (ref 70–99)
Glucose-Capillary: 110 mg/dL — ABNORMAL HIGH (ref 70–99)
Glucose-Capillary: 141 mg/dL — ABNORMAL HIGH (ref 70–99)

## 2022-09-01 LAB — BASIC METABOLIC PANEL
Anion gap: 4 — ABNORMAL LOW (ref 5–15)
BUN: 37 mg/dL — ABNORMAL HIGH (ref 8–23)
CO2: 24 mmol/L (ref 22–32)
Calcium: 8.6 mg/dL — ABNORMAL LOW (ref 8.9–10.3)
Chloride: 111 mmol/L (ref 98–111)
Creatinine, Ser: 1.53 mg/dL — ABNORMAL HIGH (ref 0.44–1.00)
GFR, Estimated: 33 mL/min — ABNORMAL LOW (ref >60)
Glucose, Bld: 101 mg/dL — ABNORMAL HIGH (ref 70–99)
Potassium: 3.8 mmol/L (ref 3.5–5.1)
Sodium: 139 mmol/L (ref 135–145)

## 2022-09-01 LAB — PREPARE RBC (CROSSMATCH)

## 2022-09-01 LAB — MAGNESIUM: Magnesium: 1.6 mg/dL — ABNORMAL LOW (ref 1.7–2.4)

## 2022-09-01 LAB — OCCULT BLOOD X 1 CARD TO LAB, STOOL: Fecal Occult Bld: NEGATIVE

## 2022-09-01 MED ORDER — FUROSEMIDE 10 MG/ML IJ SOLN
20.0000 mg | Freq: Once | INTRAMUSCULAR | Status: AC
  Administered 2022-09-01: 20 mg via INTRAVENOUS
  Filled 2022-09-01: qty 2

## 2022-09-01 MED ORDER — SODIUM CHLORIDE 0.9% IV SOLUTION: Freq: Once | INTRAVENOUS | Status: AC

## 2022-09-01 MED ORDER — ACETAMINOPHEN 325 MG PO TABS
650.0000 mg | ORAL_TABLET | Freq: Once | ORAL | Status: AC
  Administered 2022-09-01: 650 mg via ORAL
  Filled 2022-09-01: qty 2

## 2022-09-01 MED ORDER — MAGNESIUM OXIDE -MG SUPPLEMENT 400 (240 MG) MG PO TABS
400.0000 mg | ORAL_TABLET | Freq: Two times a day (BID) | ORAL | Status: DC
  Administered 2022-09-01 – 2022-09-02 (×2): 400 mg via ORAL
  Filled 2022-09-01 (×2): qty 1

## 2022-09-01 MED ORDER — CEFAZOLIN SODIUM-DEXTROSE 2-4 GM/100ML-% IV SOLN
2.0000 g | Freq: Two times a day (BID) | INTRAVENOUS | Status: DC
  Administered 2022-09-01 – 2022-09-02 (×3): 2 g via INTRAVENOUS
  Filled 2022-09-01 (×3): qty 100

## 2022-09-01 MED ORDER — DIPHENHYDRAMINE HCL 25 MG PO CAPS
25.0000 mg | ORAL_CAPSULE | Freq: Once | ORAL | Status: AC
  Administered 2022-09-01: 25 mg via ORAL
  Filled 2022-09-01: qty 1

## 2022-09-01 MED ORDER — MAGNESIUM SULFATE 4 GM/100ML IV SOLN
4.0000 g | Freq: Once | INTRAVENOUS | Status: AC
  Administered 2022-09-01: 4 g via INTRAVENOUS
  Filled 2022-09-01: qty 100

## 2022-09-01 MED ORDER — PANTOPRAZOLE SODIUM 40 MG PO TBEC
40.0000 mg | DELAYED_RELEASE_TABLET | Freq: Every day | ORAL | Status: DC
  Administered 2022-09-01 – 2022-09-02 (×2): 40 mg via ORAL
  Filled 2022-09-01 (×2): qty 1

## 2022-09-01 NOTE — Care Management Important Message (Signed)
Important Message  Patient Details IM Letter given to the Patient. Name: KORALYNN GREENSPAN MRN: [id number] Date of Birth: 11-25-1936   Medicare Important Message Given:  Yes     Kerin Salen 09/01/2022, 11:25 AM

## 2022-09-01 NOTE — Progress Notes (Signed)
Subjective:  Patient reports pain as well controlled.  Denies distal numbness and tingling.  No fevers or chills. No issues over the weekend.  Denies chest pain or shortness of breath.  Objective:   VITALS:   Vitals:   08/31/22 0543 08/31/22 1346 08/31/22 2034 09/01/22 0543  BP: (!) 122/59 (!) 112/47 (!) 115/52 (!) 114/54  Pulse: 86 82 84 83  Resp: _0 Temp: 97.9 F (36.6 C) 98 F (36.7 C) 98.1 F (36.7 C) 98.1 F (36.7 C)  TempSrc: Oral Oral Oral Oral  SpO2: 93% 96% 95% (!) 89%  Weight:      Height:        Sensation intact distally Intact pulses distally Dorsiflexion/Plantar flexion intact Compartment soft Prevena wound VAC holding suction no output in the canister  Lab Results  Component Value Date   WBC 6.1 09/01/2022   HGB 7.1 (L) 09/01/2022   HCT 23.0 (L) 09/01/2022   MCV 100.0 09/01/2022   PLT 84 (L) 09/01/2022   BMET    Component Value Date/Time   NA 139 09/01/2022 0612   NA 141 11/06/2016 1102   K 3.8 09/01/2022 0612   K 3.9 11/06/2016 1102   CL 111 09/01/2022 0612   CO2 24 09/01/2022 0612   CO2 27 11/06/2016 1102   GLUCOSE 101 (H) 09/01/2022 0612   GLUCOSE 130 11/06/2016 1102   BUN 37 (H) 09/01/2022 0612   BUN 18.0 11/06/2016 1102   CREATININE 1.53 (H) 09/01/2022 0612   CREATININE 1.23 (H) 08/19/2022 0926   CREATININE 1.16 (H) 06/16/2022 1038   CREATININE 1.3 (H) 11/06/2016 1102   CALCIUM 8.6 (L) 09/01/2022 0612   CALCIUM 10.2 11/06/2016 1102   EGFR 46 (L) 06/16/2022 1038   GFRNONAA 33 (L) 09/01/2022 0612   GFRNONAA 43 (L) 08/19/2022 0926   GFRNONAA 34 (L) 02/21/2021 1414    Xray: X-rays demonstrate reduced right hip hemiarthroplasty without adverse features  Assessment/Plan: 3 Days Post-Op   Principal Problem:   Abscess of hip Active Problems:   Essential hypertension   Hyperlipidemia associated with type 2 diabetes mellitus (HCC)   DM2 (diabetes mellitus, type 2) (HCC)   Gout   Ovarian cancer (HCC)    Hypomagnesemia   Stage 3b chronic kidney disease (CKD) (HCC)   Acute UTI (urinary tract infection)   Hypokalemia   Hyperbilirubinemia   Normocytic anemia   Thrombocytopenia (HCC)   Protein-calorie malnutrition, severe (HCC)   Right hip pain   Peritoneal carcinomatosis (HCC)   Urinary tract infection without hematuria   Pancytopenia (HCC)   Malnutrition of moderate degree   Prosthetic hip infection (HCC)   S/p R hip hemi I&D and head exchange 08/29/22    Post op recs: WB: WBAT RLE, posterior hip precautions x6 weeks Abx: abx per ID, currently on daptomycin. Aspiration with pan-sensitive STAPHYLOCOCCUS CAPRAE. Intraop cxs NGTD x3 Imaging: PACU pelvis Xray Dressing: Prevena wound VAC to remain intact until follow-up. DVT prophylaxis: Aspirin 81BID starting POD1 Follow up: 1 week after surgery for a wound check with Dr. Zachery Dakins at [facility].  Address: [address] [place], [place], [place] [address]  Office Phone: [phone]        Willaim Sheng 09/01/2022, 10:08 AM   Charlies Constable, MD  Contact information:   [phone] 7am-5pm epic message Dr. Zachery Dakins, or call office for patient follow up: [phone] After hours and holidays please check [email or web address] for group call information for [facility]

## 2022-09-01 NOTE — Progress Notes (Signed)
Subjective: No new complaints when she dislikes the IV in her neck   Antibiotics:  Anti-infectives (From admission, onward)    Start     Dose/Rate Route Frequency Ordered Stop   09/01/22 1030  ceFAZolin (ANCEF) IVPB 2g/100 mL premix        2 g 200 mL/hr over 30 Minutes Intravenous Every 12 hours 09/01/22 0944     08/29/22 1825  vancomycin (VANCOCIN) powder  Status:  Discontinued          As needed 08/29/22 1825 08/29/22 2019   08/29/22 1215  DAPTOmycin (CUBICIN) 500 mg in sodium chloride 0.9 % IVPB  Status:  Discontinued        8 mg/kg  64.4 kg 120 mL/hr over 30 Minutes Intravenous Every 48 hours 08/29/22 1126 09/01/22 0944   08/29/22 1130  DAPTOmycin (CUBICIN) 500 mg in sodium chloride 0.9 % IVPB  Status:  Discontinued        8 mg/kg  64.4 kg 120 mL/hr over 30 Minutes Intravenous Daily 08/29/22 1034 08/29/22 1126   08/29/22 1100  ceFAZolin (ANCEF) IVPB 2g/100 mL premix        2 g 200 mL/hr over 30 Minutes Intravenous On call to O.R. 08/29/22 0825 08/29/22 1735   08/28/22 1100  cephALEXin (KEFLEX) capsule 500 mg        500 mg Oral  Once 08/28/22 0945 08/28/22 1008   08/27/22 1956  Ampicillin-Sulbactam (UNASYN) 3 g in sodium chloride 0.9 % 100 mL IVPB  Status:  Discontinued       Note to Pharmacy: Please adjust as needed.  Thank you.   3 g 200 mL/hr over 30 Minutes Intravenous Every 12 hours 08/27/22 1236 08/29/22 1021   08/24/22 2000  Ampicillin-Sulbactam (UNASYN) 3 g in sodium chloride 0.9 % 100 mL IVPB  Status:  Discontinued       Note to Pharmacy: Please adjust as needed.  Thank you.   3 g 200 mL/hr over 30 Minutes Intravenous Every 6 hours 08/24/22 1739 08/27/22 1236   08/24/22 1330  ampicillin-sulbactam (UNASYN) 1.5 g in sodium chloride 0.9 % 100 mL IVPB        1.5 g 200 mL/hr over 30 Minutes Intravenous  Once 08/24/22 1317 08/24/22 1427       Medications: Scheduled Meds:  sodium chloride   Intravenous Once   allopurinol  100 mg Oral Daily   aspirin EC   81 mg Oral BID   Chlorhexidine Gluconate Cloth  6 each Topical Daily   ezetimibe  10 mg Oral Daily   furosemide  20 mg Intravenous Once   gabapentin  300 mg Oral TID   magnesium oxide  400 mg Oral BID   metoprolol succinate  25 mg Oral Daily   pantoprazole  40 mg Oral Daily   polyethylene glycol  17 g Oral BID   Ensure Max Protein  11 oz Oral BID   rosuvastatin  5 mg Oral Q M,W,F   sodium chloride flush  10-40 mL Intracatheter Q12H   Continuous Infusions:   ceFAZolin (ANCEF) IV     magnesium sulfate bolus IVPB     PRN Meds:.acetaminophen **OR** acetaminophen, diphenhydrAMINE, EPINEPHrine, ondansetron **OR** ondansetron (ZOFRAN) IV, oxyCODONE, sodium chloride flush    Objective: Weight change:   Intake/Output Summary (Last 24 hours) at 09/01/2022 1023 Last data filed at 08/31/2022 1810 Gross per 24 hour  Intake 240 ml  Output --  Net 240 ml    Blood  pressure (!) 116/53, pulse 94, temperature 98.2 F (36.8 C), temperature source Oral, resp. rate 18, height 5' 4"  (1.626 m), weight 69.1 kg, SpO2 100 %. Temp:  [98 F (36.7 C)-98.2 F (36.8 C)] 98.2 F (36.8 C) (10/02 1011) Pulse Rate:  [82-94] 94 (10/02 1011) Resp:  [14-18] 18 (10/02 1011) BP: (112-116)/(47-54) 116/53 (10/02 1011) SpO2:  [89 %-100 %] 100 % (10/02 1011)  Physical Exam: Physical Exam Constitutional:      General: She is not in acute distress.    Appearance: She is not diaphoretic.  HENT:     Head: Normocephalic and atraumatic.     Right Ear: External ear normal.     Left Ear: External ear normal.     Mouth/Throat:     Pharynx: No oropharyngeal exudate.  Eyes:     General: No scleral icterus.    Conjunctiva/sclera: Conjunctivae normal.     Pupils: Pupils are equal, round, and reactive to light.  Cardiovascular:     Rate and Rhythm: Normal rate and regular rhythm.     Heart sounds: Normal heart sounds.  Pulmonary:     Effort: Pulmonary effort is normal. No respiratory distress.     Breath sounds:  No wheezing.  Abdominal:     General: There is distension.  Musculoskeletal:        General: No tenderness. Normal range of motion.  Lymphadenopathy:     Cervical: No cervical adenopathy.  Skin:    General: Skin is warm and dry.     Coloration: Skin is not pale.     Findings: No erythema or rash.  Neurological:     General: No focal deficit present.     Mental Status: She is alert and oriented to person, place, and time.     Motor: No abnormal muscle tone.     Coordination: Coordination normal.  Psychiatric:        Mood and Affect: Mood normal.        Behavior: Behavior normal.        Thought Content: Thought content normal.        Judgment: Judgment normal.    Hip bandaged  CBC:    BMET Recent Labs    08/31/22 0531 09/01/22 0612  NA 138 139  K 4.0 3.8  CL 111 111  CO2 23 24  GLUCOSE 153* 101*  BUN 39* 37*  CREATININE 1.65* 1.53*  CALCIUM 8.6* 8.6*      Liver Panel  No results for input(s): "PROT", "ALBUMIN", "AST", "ALT", "ALKPHOS", "BILITOT", "BILIDIR", "IBILI" in the last 72 hours.     Sedimentation Rate No results for input(s): "ESRSEDRATE" in the last 72 hours. C-Reactive Protein No results for input(s): "CRP" in the last 72 hours.  Micro Results: Recent Results (from the past 720 hour(s))  Culture, blood (Routine X 2) w Reflex to ID Panel     Status: None   Collection Time: 08/05/22 12:49 PM   Specimen: BLOOD  Result Value Ref Range Status   Specimen Description   Final    BLOOD RIGHT ANTECUBITAL Performed at [facility] [address], [place], [place] [address]    Special Requests   Final    BOTTLES DRAWN AEROBIC AND ANAEROBIC Blood Culture adequate volume Performed at [facility] [address], [place], [place] [address]    Culture   Final    NO GROWTH 5 DAYS Performed at [facility], [place] [address] [place], [place] [address]  Report Status 08/10/2022 FINAL  Final  Culture, blood  (Routine X 2) w Reflex to ID Panel     Status: None   Collection Time: 08/05/22  1:15 PM   Specimen: BLOOD  Result Value Ref Range Status   Specimen Description   Final    BLOOD LEFT ANTECUBITAL Performed at [facility] [address] [place], [place] [address]    Special Requests   Final    BOTTLES DRAWN AEROBIC AND ANAEROBIC Blood Culture adequate volume Performed at [facility] [address], [place], [place] [address]    Culture   Final    NO GROWTH 5 DAYS Performed at [facility], [place] [address] [place], [place] [address]    Report Status 08/10/2022 FINAL  Final  Urine Culture     Status: Abnormal   Collection Time: 08/05/22  5:18 PM   Specimen: In/Out Cath Urine  Result Value Ref Range Status   Specimen Description   Final    IN/OUT CATH URINE Performed at [facility] [place] [facility] [place] [address] [place], [place] [address]    Special Requests   Final    NONE Performed at [facility], [place] [address], [place], [place] [address]    Culture >=100,000 COLONIES/mL ESCHERICHIA COLI (A)  Final   Report Status 08/07/2022 FINAL  Final   Organism ID, Bacteria ESCHERICHIA COLI (A)  Final      Susceptibility   Escherichia coli - MIC*    AMPICILLIN <=2 SENSITIVE Sensitive     CEFAZOLIN <=4 SENSITIVE Sensitive     CEFEPIME <=0.12 SENSITIVE Sensitive     CEFTRIAXONE <=0.25 SENSITIVE Sensitive     CIPROFLOXACIN <=0.25 SENSITIVE Sensitive     GENTAMICIN <=1 SENSITIVE Sensitive     IMIPENEM <=0.25 SENSITIVE Sensitive     NITROFURANTOIN <=16 SENSITIVE Sensitive     TRIMETH/SULFA <=20 SENSITIVE Sensitive     AMPICILLIN/SULBACTAM <=2 SENSITIVE Sensitive     PIP/TAZO <=4 SENSITIVE Sensitive     * >=100,000 COLONIES/mL ESCHERICHIA COLI  Aerobic/Anaerobic Culture w Gram Stain (surgical/deep wound)     Status: None   Collection Time: 08/25/22 12:36 PM   Specimen: Abscess  Result Value Ref Range Status    Specimen Description   Final    ABSCESS RIGHT HIP Performed at [facility] [address], [place], [place] [address]    Special Requests   Final    NONE Performed at [facility], [place] [address], [place], [place] [address]    Gram Stain   Final    MODERATE WBC PRESENT, PREDOMINANTLY PMN NO ORGANISMS SEEN    Culture   Final    RARE STAPHYLOCOCCUS CAPRAE NO ANAEROBES ISOLATED Performed at [facility], [place] [address], [place], [place] [address]    Report Status 08/31/2022 FINAL  Final   Organism ID, Bacteria STAPHYLOCOCCUS CAPRAE  Final      Susceptibility   Staphylococcus caprae - MIC*    CIPROFLOXACIN <=0.5 SENSITIVE Sensitive     ERYTHROMYCIN 0.5 SENSITIVE Sensitive     GENTAMICIN <=0.5 SENSITIVE Sensitive     OXACILLIN <=0.25 SENSITIVE Sensitive     TETRACYCLINE <=1 SENSITIVE Sensitive     VANCOMYCIN <=0.5 SENSITIVE Sensitive     TRIMETH/SULFA <=10 SENSITIVE Sensitive     CLINDAMYCIN <=0.25 SENSITIVE Sensitive     RIFAMPIN <=0.5 SENSITIVE Sensitive     Inducible Clindamycin NEGATIVE Sensitive     * RARE  STAPHYLOCOCCUS CAPRAE  Surgical pcr screen     Status: None   Collection Time: 08/29/22 12:54 AM   Specimen: Nasal Mucosa; Nasal Swab  Result Value Ref Range Status   MRSA, PCR NEGATIVE NEGATIVE Final   Staphylococcus aureus NEGATIVE NEGATIVE Final    Comment: (NOTE) The Xpert SA Assay (FDA approved for NASAL specimens in patients 77 years of age and older), is one component of a comprehensive surveillance program. It is not intended to diagnose infection nor to guide or monitor treatment. Performed at [facility], [place] [address] [place], [place] [address]   Aerobic/Anaerobic Culture w Gram Stain (surgical/deep wound)     Status: None (Preliminary result)   Collection Time: 08/29/22  6:08 PM   Specimen: PATH Other; Tissue  Result Value Ref Range Status   Specimen Description   Final    TISSUE RIGHT  HIP 1 Performed at [facility] [address] [place], [place] [address]    Special Requests   Final    NONE Performed at [facility], [place] [address], [place], [place] [address]    Gram Stain NO WBC SEEN NO ORGANISMS SEEN   Final   Culture   Final    NO GROWTH 3 DAYS NO ANAEROBES ISOLATED; CULTURE IN PROGRESS FOR 5 DAYS Performed at [facility] [address] [place], [place] [address]    Report Status PENDING  Incomplete  Aerobic/Anaerobic Culture w Gram Stain (surgical/deep wound)     Status: None (Preliminary result)   Collection Time: 08/29/22  6:17 PM   Specimen: PATH Other; Tissue  Result Value Ref Range Status   Specimen Description   Final    TISSUE RIGHT HIP 2 Performed at [facility], [place] [address] [place], [place] [address]    Special Requests   Final    NONE Performed at [facility], [place] [address] [place], [place] [address]    Gram Stain NO WBC SEEN NO ORGANISMS SEEN   Final   Culture   Final    NO GROWTH 3 DAYS NO ANAEROBES ISOLATED; CULTURE IN PROGRESS FOR 5 DAYS Performed at [facility] [address], [place], [place] [address]    Report Status PENDING  Incomplete  Aerobic/Anaerobic Culture w Gram Stain (surgical/deep wound)     Status: None (Preliminary result)   Collection Time: 08/29/22  6:18 PM   Specimen: PATH Other; Tissue  Result Value Ref Range Status   Specimen Description   Final    TISSUE RIGHT HIP 3 Performed at [facility], [place] [address], [place], [place] [address]    Special Requests   Final    NONE Performed at [facility], [place] [address] [place], [place] [address]    Gram Stain NO WBC SEEN NO ORGANISMS SEEN   Final   Culture   Final    NO GROWTH 3 DAYS NO ANAEROBES ISOLATED; CULTURE IN PROGRESS FOR 5 DAYS Performed at [facility] [address], [place], [place] [address]    Report  Status PENDING  Incomplete    Studies/Results: No results found.    Assessment/Plan:  INTERVAL HISTORY:  cultures are yielding a staphylococcus caprae S to methicillin     Principal Problem:   Abscess of hip Active Problems:   Essential hypertension   Hyperlipidemia associated with type 2 diabetes mellitus ([place])   DM2 (diabetes mellitus, type 2) ([place])  Gout   Ovarian cancer (HCC)   Hypomagnesemia   Stage 3b chronic kidney disease (CKD) (HCC)   Acute UTI (urinary tract infection)   Hypokalemia   Hyperbilirubinemia   Normocytic anemia   Thrombocytopenia (HCC)   Protein-calorie malnutrition, severe (HCC)   Right hip pain   Peritoneal carcinomatosis (HCC)   Urinary tract infection without hematuria   Pancytopenia (HCC)   Malnutrition of moderate degree   Prosthetic hip infection (HCC)    Kayla Baker is a 86 y.o. female with hx of HTN, hyperlipidemia, breast cancer, cephalosporin allergy and recent ovarian cancer with carcinomatosis who presented to ER w abdominal pain (her ascites) and right  hip pain with abscess found in gluteal muscles that appears to communicate with hip. She is now sp I and D and polyexchange in OR  #1 [facility]  Due to Staph caprae S to methicillin  Switch to cefazolin   Diagnosis: Prosthetic R hip joint  Culture Result: Staph caprae  Allergies  Allergen Reactions   Mobic [Meloxicam] Swelling   Prednisone Swelling and Other (See Comments)    Can Not take High doses   Premarin [Estrogens Conjugated] Hives   Trovan [Alatrofloxacin] Other (See Comments)    Unknown reaction    OPAT Orders Discharge antibiotics to be given via PICC line Discharge antibiotics: Cefazolin 2 gm IV Q 12 hrs Duration: 6 weeks  End Date: 10/10/2022  [facility] Care Per Protocol:  Home health RN for IV administration and teaching; PICC line care and labs.    Labs weekly while on IV antibiotics: _x_ CBC with differential _x_ BMP  _x_ CRP _x_ ESR    Fax  weekly labs to [phone]  Clinic Follow Up Appt:   Jenet Durio Curb has an appointment on  at 09/23/2022 at 245PM with Dr. Tommy Medal at:  The [facility], which  is located in the [facility] at  [address] in [place].  [address], which is located to the left of the elevators.  Phone: [phone]  Fax: [phone]  [email or web address]  The patient should arrive 30 minutes prior to their appoitment.  #2 Ovarian cancer: chemo put on hold while she receives IV antibiotics  #3 1 allergy she tolerated cephalosporin challenge last week and we will put her on cefazolin now.   I spent 50mnutes with the patient including than 50% of the time in face to face counseling of the patient prosthetic joint infectionalong with review of medical records in preparation for the visit and during the visit and in coordination of her care.  To follow-up her intraoperative cultures though I doubt we will find a different organism.  Otherwise I will sign off for now please call with further questions.         LOS: 8 days   CAlcide Evener10/01/2022, 10:23 AM

## 2022-09-01 NOTE — Progress Notes (Signed)
I have reviewed her chart It appears that the patient will be receiving IV antibiotics for several weeks I explained to the patient and her husband that we cannot proceed with chemotherapy while she is receiving IV antibiotics She expressed verbal understanding I will put her treatment plan on hold and cancel her appointment this week The patient is educated to call me once she is aware that her antibiotics will be completed  I will sign off

## 2022-09-01 NOTE — Progress Notes (Signed)

## 2022-09-01 NOTE — Progress Notes (Signed)
PROGRESS NOTE    Kayla Baker  DXI:[id number] DOB: 30-Jan-1936 DOA: 08/24/2022 PCP: [name], MD    Chief Complaint  Patient presents with   Abdominal Pain    Brief Narrative:  86 year old married female with PMH of ovarian cancer with peritoneal carcinomatosis and fluid retention secondary to third spacing, anemia in neoplastic disease, CKD stage III, type II DM, GERD, gout, HLD, HTN, s/p right hip hemiarthroplasty 11/08/2021, presented to the ED on 08/22/2022 with complaints of abdominal distention, pain and pressure across her abdomen, nausea, decreased appetite and also with right hip pain with decreased mobility.  Dysuria, being treated for UTI with amoxicillin.  Admitted for acute cystitis, right hip pain with imaging showing right gluteal fluid collection.  Started on IV antibiotics.  Orthopedics consulted, who had IR aspirate and placed drain in the gluteal fluid.   Assessment & Plan:   Principal Problem:   Abscess of hip Active Problems:   Prosthetic hip infection ([place])   Essential hypertension   Hyperlipidemia associated with type 2 diabetes mellitus (HCC)   DM2 (diabetes mellitus, type 2) (HCC)   Gout   Ovarian cancer ([place])   Hypomagnesemia   Stage 3b chronic kidney disease (CKD) (HCC)   Acute UTI (urinary tract infection)   Hypokalemia   Hyperbilirubinemia   Normocytic anemia   Thrombocytopenia (HCC)   Protein-calorie malnutrition, severe (HCC)   Right hip pain   Peritoneal carcinomatosis (HCC)   Urinary tract infection without hematuria   Pancytopenia (HCC)   Malnutrition of moderate degree   Allergy to cephalosporin  #1 acute UTI/acute cystitis without hematuria -Patient noted to have a urine culture from 08/05/2022 with a pansensitive E. coli. -Patient noted to have been treated appropriately in the outpatient setting with amoxicillin. -Status post 3 days IV Unasyn.    2.  Right hip pain, status post previous right hip hemiarthroplasty for  fracture/right gluteal fluid collection seen on CT/probable prosthetic hip infection -Patient seen in consultation by orthopedics, IR consulted and fluid aspirated 08/25/2022 with Gram stain showing moderate WBC predominantly neutrophils, no organisms, cultures and cytology pending. -Drain currently in place with serosanguineous fluid noted. -Patient states still with pain on ambulation. -Currently on IV Unasyn with preliminary cultures pending with rare Staphylococcus Caprae. -ID consulted, patient seen in consultation by Dr. Tommy Medal this morning 08/28/2022 who reviewed imaging, assessed patient and concern for right-sided prosthetic hip infection. -Dr. Tommy Medal, ID relayed his thoughts to orthopedics, Dr.Marchwiany and patient taken to the OR, 08/29/2022, s/p I&D and intraoperative cultures. -Patient with clinical improvement. -PT/OT. -ID currently recommending probable 6 weeks of targeted IV antibiotics followed by oral antibiotics for total of 6 months versus a year. -IV Unasyn changed to IV daptomycin per ID. -Cultures of staph. caprae Sensitive to methicillin and as such IV antibiotics changed to IV daptomycin to IV cefazolin. -We will need IV cefazolin with EOT of 10/10/2022 with outpatient follow-up with ID 09/23/2022 at 2:45 PM with Dr. Tommy Medal. -ID, orthopedics following and appreciate input and recommendations.  3.  History of ovarian cancer, peritoneal carcinomatosis with ascites -HCTZ discontinued and patient started on low-dose Lasix and Aldactone. -Currently on a low-salt diet. -Fluid restriction. -Patient noted to have had paracentesis in the past however currently stable and no need for paracentesis at this time. -Patient seen by primary oncologist, Dr. [name], patient noted to be hypoalbuminemic with third spacing and due to current infection chemotherapy being postponed to next week. -It is noted that Dr. [name] has discussed  extensively with patient and family multiple times at  the cancer center regarding goals of care and wish to continue aggressive therapy at this time. -Status post IV albumin x1 08/27/2022.   -Patient with bump in creatinine and as such Lasix and Aldactone held which we will continue to do.  -Outpatient treatment with oncology to be postponed until completion of IV antibiotics. -Outpatient follow-up with oncology.  4.  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 -Status post IV albumin x1. -Patient with bump in creatinine and as such diuretics of Aldactone and Lasix held which we will continue to hold for now.  5.  Hypomagnesemia -Magnesium at 1.6 from 2.0 from 2.4 from 1.3. -Magnesium sulfate 4 g IV x1. -Increase oral magnesium supplementation to 400 mg twice daily. -Repeat labs in the AM.  6.  CKD stage IIIb -Noted to be slightly trending up, patient noted to have CT contrast 08/24/2022, Lasix and Aldactone started on 08/25/2022. -Lasix and Aldactone held with creatinine trending back down and fluctuating.   Currently at 1.53 today.   7.  Hypokalemia -Repleted.   -Potassium at 3.8 this morning.  -Magnesium currently at 1.6.   -Magnesium sulfate 4 g IV x1.   -Increase oral magnesium supplementation to 400 mg twice daily.  -Repeat labs in the AM.  8.  Hypertension -Controlled on current regimen of metoprolol 25 mg daily.  -HCTZ discontinued likely will not resume on discharge. -Continue to hold low-dose Aldactone and Lasix due to bump in creatinine.  9.  Hyperlipidemia -Continue rosuvastatin 5 mg 3 times weekly.   10.  Diabetes mellitus type 2 -Hemoglobin A1c 6.2 (06/16/2022). -CBG 102 this morning. -Continue to hold oral hypoglycemic agents. -Follow-up.  11.  Gout -Allopurinol.  12.  Hyperbilirubinemia -Resolved.  13.  Normocytic anemia/anemia in neoplastic disease -Patient with no overt bleeding. -Hemoglobin currently at 7.1 this morning. -Status  posttransfusion 1 unit packed red blood cells preoperatively. -FOBT negative.   -Transfused 2 units packed red blood cells.   -Repeat labs in the AM.   14.  Pancytopenia -Likely due to recent treatment. -Hemoglobin at 7.1 this morning, status posttransfusion 1 units packed red blood cells 08/29/2022.   -FOBT negative. -Platelet count stable. -Transfuse 2 units packed red blood cells. -Follow.  15.  Severe protein calorie malnutrition -Continue nutritional supplementation.    DVT prophylaxis: SCDs Code Status: Full Family Communication: Updated patient, husband at bedside. Disposition: TBD  Status is: Inpatient Remains inpatient appropriate because: Severity of illness.     Consultants:  Orthopedics: Dr. Mardelle Matte 08/24/2022 Oncology: Dr. [name] 08/26/2022 ID: Dr. Tommy Medal 08/28/2022  Procedures:  10 French drain placed in the right hip fluid collection per IR, Dr.Mir 08/25/2022 CT abdomen and pelvis 08/24/2022 Lower extremity Dopplers 08/22/2022 Incision and drainage of right hip with exchange of modular components: Bipolar head ball per Dr. Zachery Dakins 08/29/2022 Transfusion 1 unit packed red blood cells 08/29/2022. Transfusion 2 units packed red blood cells pending 09/01/2022  Antimicrobials:  IV Unasyn 08/24/2022>>>> 08/29/2022 IV daptomycin 08/29/2022>>>> 09/01/2022 IV Ancef 09/01/2022>>>>>   Subjective: Just sat in the recliner.  Working with therapy.  Denies any chest pain.  No shortness of breath.  No abdominal pain.  Right hip pain on ambulation has improved significantly postoperatively per patient.  Patient denies any overt bleeding.  Husband at bedside.    Objective: Vitals:   09/01/22 0543 09/01/22 1011 09/01/22 1143 09/01/22 1210  BP: (!) 114/54 (!) 116/53 128/64 (!) 109/57  Pulse: 83 94 88 86  Resp: 16 18  18   Temp: 98.1 F (36.7 C) 98.2 F (36.8 C) 97.9 F (36.6 C) 98 F (36.7 C)  TempSrc: Oral Oral Oral   SpO2: (!) 89% 100% 100%   Weight:      Height:         Intake/Output Summary (Last 24 hours) at 09/01/2022 1231 Last data filed at 08/31/2022 1810 Gross per 24 hour  Intake 240 ml  Output --  Net 240 ml    Filed Weights   08/27/22 0607 08/29/22 1228 08/31/22 0500  Weight: 64.4 kg 64.4 kg 69.1 kg    Examination:  General exam: No acute distress.  Comfortable. Respiratory system: CTA B.  No wheezes, no crackles, no rhonchi.  Fair air movement.  Speaking in full sentences.  Cardiovascular system: RRR no murmurs rubs or gallops.  No JVD.  2+ bilateral lower extremity edema.  Gastrointestinal system: Abdomen is soft, mildly distended, nontender to palpation, positive bowel sounds.  No rebound.  No guarding. Right hip/buttocks with wound VAC noted. Central nervous system: Alert and oriented. No focal neurological deficits. Extremities: Symmetric 5 x 5 power. Skin: No rashes, lesions or ulcers Psychiatry: Judgement and insight appear normal. Mood & affect appropriate.     Data Reviewed: I have personally reviewed following labs and imaging studies  CBC: Recent Labs  Lab 08/28/22 0602 08/28/22 1339 08/29/22 0623 08/30/22 0600 08/31/22 0531 08/31/22 1601 09/01/22 0612  WBC 3.5*  --  4.7 5.3 11.3* 12.0* 6.1  NEUTROABS 1.9  --  2.8 4.2 9.1*  --  4.2  HGB 7.0*   < > 7.3* 8.7* 7.2* 7.6* 7.1*  HCT 22.5*   < > 23.7* 27.2* 22.9* 24.0* 23.0*  MCV 100.0  --  99.2 96.8 100.0 99.6 100.0  PLT 89*  --  98* 101* 99* 101* 84*   < > = values in this interval not displayed.     Basic Metabolic Panel: Recent Labs  Lab 08/28/22 0602 08/29/22 0623 08/30/22 0600 08/31/22 0531 09/01/22 0612  NA 138 137 137 138 139  K 3.5 3.7 4.4 4.0 3.8  CL 108 109 109 111 111  CO2 24 23 21* 23 24  GLUCOSE 90 104* 147* 153* 101*  BUN 20 25* 28* 39* 37*  CREATININE 1.73* 1.65* 1.55* 1.65* 1.53*  CALCIUM 8.7* 8.7* 8.7* 8.6* 8.6*  MG 1.3* 2.4 2.0  --  1.6*     GFR: Estimated Creatinine Clearance: 26.1 mL/min (A) (by C-G formula based on SCr of 1.53  mg/dL (H)).  Liver Function Tests: Recent Labs  Lab 08/26/22 1258  AST 67*  ALT 33  ALKPHOS 137*  BILITOT 0.9  PROT 6.7  ALBUMIN 2.4*     CBG: Recent Labs  Lab 08/31/22 1152 08/31/22 1746 08/31/22 2352 09/01/22 0539 09/01/22 1151  GLUCAP 164* 188* 115* 102* 110*      Recent Results (from the past 240 hour(s))  Aerobic/Anaerobic Culture w Gram Stain (surgical/deep wound)     Status: None   Collection Time: 08/25/22 12:36 PM   Specimen: Abscess  Result Value Ref Range Status   Specimen Description   Final    ABSCESS RIGHT HIP Performed at [facility] [address], [place], [place] [address]    Special Requests   Final    NONE Performed at [facility], [place] [address] [place],  [address]    Gram Stain   Final    MODERATE  WBC PRESENT, PREDOMINANTLY PMN NO ORGANISMS SEEN    Culture   Final    RARE STAPHYLOCOCCUS CAPRAE NO ANAEROBES ISOLATED Performed at [facility], [place] [address] [place], [place] [address]    Report Status 08/31/2022 FINAL  Final   Organism ID, Bacteria STAPHYLOCOCCUS CAPRAE  Final      Susceptibility   Staphylococcus caprae - MIC*    CIPROFLOXACIN <=0.5 SENSITIVE Sensitive     ERYTHROMYCIN 0.5 SENSITIVE Sensitive     GENTAMICIN <=0.5 SENSITIVE Sensitive     OXACILLIN <=0.25 SENSITIVE Sensitive     TETRACYCLINE <=1 SENSITIVE Sensitive     VANCOMYCIN <=0.5 SENSITIVE Sensitive     TRIMETH/SULFA <=10 SENSITIVE Sensitive     CLINDAMYCIN <=0.25 SENSITIVE Sensitive     RIFAMPIN <=0.5 SENSITIVE Sensitive     Inducible Clindamycin NEGATIVE Sensitive     * RARE STAPHYLOCOCCUS CAPRAE  Surgical pcr screen     Status: None   Collection Time: 08/29/22 12:54 AM   Specimen: Nasal Mucosa; Nasal Swab  Result Value Ref Range Status   MRSA, PCR NEGATIVE NEGATIVE Final   Staphylococcus aureus NEGATIVE NEGATIVE Final    Comment: (NOTE) The Xpert SA Assay (FDA approved for NASAL specimens in  patients 96 years of age and older), is one component of a comprehensive surveillance program. It is not intended to diagnose infection nor to guide or monitor treatment. Performed at [facility], [place] [address] [place], [place] [address]   Aerobic/Anaerobic Culture w Gram Stain (surgical/deep wound)     Status: None (Preliminary result)   Collection Time: 08/29/22  6:08 PM   Specimen: PATH Other; Tissue  Result Value Ref Range Status   Specimen Description   Final    TISSUE RIGHT HIP 1 Performed at [facility] [address], [place], [place] [address]    Special Requests   Final    NONE Performed at [facility], [place] [address], [place], [place] [address]    Gram Stain NO WBC SEEN NO ORGANISMS SEEN   Final   Culture   Final    NO GROWTH 3 DAYS NO ANAEROBES ISOLATED; CULTURE IN PROGRESS FOR 5 DAYS Performed at [facility] [address], [place], [place] [address]    Report Status PENDING  Incomplete  Aerobic/Anaerobic Culture w Gram Stain (surgical/deep wound)     Status: None (Preliminary result)   Collection Time: 08/29/22  6:17 PM   Specimen: PATH Other; Tissue  Result Value Ref Range Status   Specimen Description   Final    TISSUE RIGHT HIP 2 Performed at [facility], [place] [address] [place], [place] [address]    Special Requests   Final    NONE Performed at [facility], [place] [address] [place], [place] [address]    Gram Stain NO WBC SEEN NO ORGANISMS SEEN   Final   Culture   Final    NO GROWTH 3 DAYS NO ANAEROBES ISOLATED; CULTURE IN PROGRESS FOR 5 DAYS Performed at [facility] [address] [place], [place] [address]    Report Status PENDING  Incomplete  Aerobic/Anaerobic Culture w Gram Stain (surgical/deep wound)     Status: None (Preliminary result)   Collection Time: 08/29/22  6:18 PM   Specimen: PATH Other; Tissue  Result Value Ref Range  Status   Specimen Description   Final    TISSUE RIGHT HIP 3 Performed at [facility], [place] [address], [place], [place] [address]    Special Requests   Final    NONE Performed at [facility], [place] [address], [place], [place] [address]    Gram Stain NO WBC SEEN NO ORGANISMS SEEN   Final   Culture   Final    NO GROWTH 3 DAYS NO ANAEROBES ISOLATED; CULTURE IN PROGRESS FOR 5 DAYS Performed at [facility] [address], [place], [place] [address]    Report Status PENDING  Incomplete         Radiology Studies: No results found.      Scheduled Meds:  allopurinol  100 mg Oral Daily   aspirin EC  81 mg Oral BID   Chlorhexidine Gluconate Cloth  6 each Topical Daily   ezetimibe  10 mg Oral Daily   furosemide  20 mg Intravenous Once   gabapentin  300 mg Oral TID   magnesium oxide  400 mg Oral BID   metoprolol succinate  25 mg Oral Daily   pantoprazole  40 mg Oral Daily   polyethylene glycol  17 g Oral BID   Ensure Max Protein  11 oz Oral BID   rosuvastatin  5 mg Oral Q M,W,F   sodium chloride flush  10-40 mL Intracatheter Q12H   Continuous Infusions:   ceFAZolin (ANCEF) IV 2 g (09/01/22 1026)   magnesium sulfate bolus IVPB 4 g (09/01/22 1201)     LOS: 8 days    Time spent: 35 minutes    Irine Seal, MD [facility]   To contact the attending provider between 7A-7P or the covering provider during after hours 7P-7A, please log into the web site [email or web address] and access using universal [place] password for that web site. If you do not have the password, please call the hospital operator.  09/01/2022, 12:31 PM

## 2022-09-01 NOTE — Progress Notes (Signed)
Physical Therapy Treatment Patient Details Name: Kayla Baker MRN: [id number] DOB: 10/11/1936 Today's Date: 09/01/2022   History of Present Illness 86 year old married female with PMH of ovarian cancer with peritoneal carcinomatosis and fluid retention secondary to third spacing, anemia in neoplastic disease, CKD stage III, type II DM, GERD, gout, HLD, HTN, s/p right hip hemiarthroplasty 11/08/2021, presented to the ED on 08/22/2022 with complaints of abdominal distention, pain and pressure across her abdomen, nausea, decreased appetite and also with right hip pain with decreased mobility.  9/25: IR placed Drain in right hip fluid collection. S/p I and D and revision femoral head component and VAC placed.    PT Comments    Pt continues to progress toward acute PT goals this session with progression of ambulation distance. Pt ambulated ~256f with MIN guard-supervision, no LOB observed. Pt able to recall posterior hip precautions, utilizing handout for 1 reminder. PT reviewed LE HEP, pt demonstrated understanding with no reports of increased pain.  Pt will benefit from continued skilled PT to increase their independence and maximize safety with mobility.     Recommendations for follow up therapy are one component of a multi-disciplinary discharge planning process, led by the attending physician.  Recommendations may be updated based on patient status, additional functional criteria and insurance authorization.  Follow Up Recommendations  Follow physician's recommendations for discharge plan and follow up therapies     Assistance Recommended at Discharge Intermittent Supervision/Assistance  Patient can return home with the following A little help with walking and/or transfers;A little help with bathing/dressing/bathroom;Assistance with cooking/housework;Assist for transportation;Help with stairs or ramp for entrance   Equipment Recommendations  None recommended by PT    Recommendations for Other  Services       Precautions / Restrictions Precautions Precautions: Fall;Posterior Hip Precaution Booklet Issued: Yes (comment) Precaution Comments: right hip VAC; Pt recalls no THP - all precautions reviewed Restrictions Weight Bearing Restrictions: No Other Position/Activity Restrictions: WBAT     Mobility  Bed Mobility               General bed mobility comments: Pt up in chair and requests back to same    Transfers Overall transfer level: Needs assistance Equipment used: Rolling walker (2 wheels) Transfers: Sit to/from Stand Sit to Stand: Min guard           General transfer comment: cues for  maintenance of hip precautions with sit to stand transfers    Ambulation/Gait Ambulation/Gait assistance: Min guard Gait Distance (Feet): 280 Feet Assistive device: Rolling walker (2 wheels) Gait Pattern/deviations: Decreased stride length, Step-through pattern, Antalgic, Decreased weight shift to right Gait velocity: decreased     General Gait Details: no overt LOB observed. mild discomfort in R hip. X1 standin rest break. ambulated ~145fwith use of IV pole. Focus on picking up feet and making appropriate turns to adhere to precautions and avoid any pivoting.   Stairs             Wheelchair Mobility    Modified Rankin (Stroke Patients Only)       Balance Overall balance assessment: Mild deficits observed, not formally tested                                          Cognition Arousal/Alertness: Awake/alert Behavior During Therapy: WFL for tasks assessed/performed Overall Cognitive Status: Within Functional Limits for tasks assessed  Exercises Total Joint Exercises Ankle Circles/Pumps: AROM, Both, 20 reps, Seated Quad Sets: AROM, Right, 10 reps, Seated Gluteal Sets: AROM, Right, 10 reps, Seated Short Arc Quad: AROM, Right, 10 reps, Seated    General Comments General  comments (skin integrity, edema, etc.): review of stair negotiation "up with the good, down with the bad" which pt is familiar with due to previous procedures.      Pertinent Vitals/Pain Pain Assessment Pain Assessment: 0-10 Pain Score: 3  Pain Location: right hip Pain Descriptors / Indicators: Aching, Sore Pain Intervention(s): Limited activity within patient's tolerance, Monitored during session    Home Living                          Prior Function            PT Goals (current goals can now be found in the care plan section) Acute Rehab PT Goals Patient Stated Goal: go home PT Goal Formulation: With patient/family Time For Goal Achievement: 09/06/22 Potential to Achieve Goals: Good Progress towards PT goals: Progressing toward goals    Frequency    Min 6X/week      PT Plan Current plan remains appropriate    Co-evaluation              AM-PAC PT "6 Clicks" Mobility   Outcome Measure  Help needed turning from your back to your side while in a flat bed without using bedrails?: A Little Help needed moving from lying on your back to sitting on the side of a flat bed without using bedrails?: A Little Help needed moving to and from a bed to a chair (including a wheelchair)?: A Little Help needed standing up from a chair using your arms (e.g., wheelchair or bedside chair)?: A Little Help needed to walk in hospital room?: A Little Help needed climbing 3-5 steps with a railing? : A Little 6 Click Score: 18    End of Session Equipment Utilized During Treatment: Gait belt Activity Tolerance: Patient tolerated treatment well Patient left: in chair;with call bell/phone within reach;with family/visitor present;with nursing/sitter in room Nurse Communication: Mobility status PT Visit Diagnosis: Difficulty in walking, not elsewhere classified (R26.2);Pain Pain - Right/Left: Right Pain - part of body: Hip     Time: 0940-7680 PT Time Calculation (min)  (ACUTE ONLY): 33 min  Charges:  $Therapeutic Exercise: 8-22 mins $Therapeutic Activity: 8-22 mins                     Festus Barren PT, DPT  Acute Rehabilitation Services  Office [phone]  09/01/2022, 1:03 PM

## 2022-09-01 NOTE — Progress Notes (Signed)
PHARMACY CONSULT NOTE FOR:  OUTPATIENT  PARENTERAL ANTIBIOTIC THERAPY (OPAT)  Indication: Prosthetic R hip joint infection Regimen: Cefazolin 2 gm IV Q 12 hours  End date: 10/10/22  IV antibiotic discharge orders are pended. To discharging provider:  please sign these orders via discharge navigator,  Select New Orders & click on the button choice - Manage This Unsigned Work.     Thank you for allowing pharmacy to be a part of this patient's care.  Jimmy Footman, PharmD, BCPS, BCIDP Infectious Diseases Clinical Pharmacist Phone: [phone] 09/01/2022 10:17 AM

## 2022-09-02 DIAGNOSIS — E1122 Type 2 diabetes mellitus with diabetic chronic kidney disease: Secondary | ICD-10-CM | POA: Diagnosis not present

## 2022-09-02 DIAGNOSIS — T8459XS Infection and inflammatory reaction due to other internal joint prosthesis, sequela: Secondary | ICD-10-CM

## 2022-09-02 DIAGNOSIS — L02419 Cutaneous abscess of limb, unspecified: Secondary | ICD-10-CM | POA: Diagnosis not present

## 2022-09-02 DIAGNOSIS — N390 Urinary tract infection, site not specified: Secondary | ICD-10-CM | POA: Diagnosis not present

## 2022-09-02 DIAGNOSIS — T8459XD Infection and inflammatory reaction due to other internal joint prosthesis, subsequent encounter: Secondary | ICD-10-CM | POA: Diagnosis not present

## 2022-09-02 LAB — TYPE AND SCREEN
ABO/RH(D): A POS
Antibody Screen: NEGATIVE
Unit division: 0
Unit division: 0
Unit division: 0

## 2022-09-02 LAB — BPAM RBC
Blood Product Expiration Date: 202310232359
Blood Product Expiration Date: 202310232359
Blood Product Expiration Date: 202310262359
ISSUE DATE / TIME: 202309291700
ISSUE DATE / TIME: 202310021150
ISSUE DATE / TIME: 202310021516
Unit Type and Rh: 6200
Unit Type and Rh: 6200
Unit Type and Rh: 6200

## 2022-09-02 LAB — CBC WITH DIFFERENTIAL/PLATELET
Abs Immature Granulocytes: 0.04 10*3/uL (ref 0.00–0.07)
Basophils Absolute: 0 10*3/uL (ref 0.0–0.1)
Basophils Relative: 1 %
Eosinophils Absolute: 0.2 10*3/uL (ref 0.0–0.5)
Eosinophils Relative: 3 %
HCT: 31.8 % — ABNORMAL LOW (ref 36.0–46.0)
Hemoglobin: 10.6 g/dL — ABNORMAL LOW (ref 12.0–15.0)
Immature Granulocytes: 1 %
Lymphocytes Relative: 21 %
Lymphs Abs: 1.2 10*3/uL (ref 0.7–4.0)
MCH: 31.1 pg (ref 26.0–34.0)
MCHC: 33.3 g/dL (ref 30.0–36.0)
MCV: 93.3 fL (ref 80.0–100.0)
Monocytes Absolute: 0.5 10*3/uL (ref 0.1–1.0)
Monocytes Relative: 9 %
Neutro Abs: 3.5 10*3/uL (ref 1.7–7.7)
Neutrophils Relative %: 65 %
Platelets: 82 10*3/uL — ABNORMAL LOW (ref 150–400)
RBC: 3.41 MIL/uL — ABNORMAL LOW (ref 3.87–5.11)
RDW: 18.5 % — ABNORMAL HIGH (ref 11.5–15.5)
WBC: 5.4 10*3/uL (ref 4.0–10.5)
nRBC: 0 % (ref 0.0–0.2)

## 2022-09-02 LAB — BASIC METABOLIC PANEL
Anion gap: 7 (ref 5–15)
BUN: 35 mg/dL — ABNORMAL HIGH (ref 8–23)
CO2: 25 mmol/L (ref 22–32)
Calcium: 8.5 mg/dL — ABNORMAL LOW (ref 8.9–10.3)
Chloride: 105 mmol/L (ref 98–111)
Creatinine, Ser: 1.52 mg/dL — ABNORMAL HIGH (ref 0.44–1.00)
GFR, Estimated: 34 mL/min — ABNORMAL LOW (ref >60)
Glucose, Bld: 106 mg/dL — ABNORMAL HIGH (ref 70–99)
Potassium: 3.6 mmol/L (ref 3.5–5.1)
Sodium: 137 mmol/L (ref 135–145)

## 2022-09-02 LAB — GLUCOSE, CAPILLARY
Glucose-Capillary: 103 mg/dL — ABNORMAL HIGH (ref 70–99)
Glucose-Capillary: 132 mg/dL — ABNORMAL HIGH (ref 70–99)
Glucose-Capillary: 150 mg/dL — ABNORMAL HIGH (ref 70–99)

## 2022-09-02 LAB — MAGNESIUM: Magnesium: 2.1 mg/dL (ref 1.7–2.4)

## 2022-09-02 MED ORDER — ENSURE MAX PROTEIN PO LIQD: 11.0000 [oz_av] | Freq: Two times a day (BID) | ORAL | Status: DC

## 2022-09-02 MED ORDER — ORAL CARE MOUTH RINSE: 15.0000 mL | OROMUCOSAL | Status: DC | PRN

## 2022-09-02 MED ORDER — ASPIRIN 81 MG PO TBEC: 81.0000 mg | DELAYED_RELEASE_TABLET | Freq: Two times a day (BID) | ORAL | 12 refills | Status: DC

## 2022-09-02 MED ORDER — OXYCODONE HCL 5 MG PO TABS: 2.5000 mg | ORAL_TABLET | ORAL | 0 refills | Status: DC | PRN

## 2022-09-02 MED ORDER — CEFAZOLIN IV (FOR PTA / DISCHARGE USE ONLY): 2.0000 g | Freq: Two times a day (BID) | INTRAVENOUS | 0 refills | Status: AC

## 2022-09-02 MED ORDER — PANTOPRAZOLE SODIUM 40 MG PO TBEC: 40.0000 mg | DELAYED_RELEASE_TABLET | Freq: Every day | ORAL | 1 refills | Status: DC

## 2022-09-02 NOTE — Discharge Summary (Signed)
Physician Discharge Summary  Kayla Baker GMW:[id number] DOB: [birth year] DOA: 08/24/2022  PCP: Kayla Pinto, MD  Admit date: 08/24/2022 Discharge date: 09/02/2022  Time spent: 60 minutes  Recommendations for Outpatient Follow-up:  Follow-up with Kayla Baker as scheduled. Follow-up with Kayla. Zachery Baker in 1 week. Follow-up with Kayla. Tommy Baker, ID on 09/23/2022 at 2:45 PM. Follow-up with Kayla Pinto, MD in 2 to 3 weeks.  On follow-up patient will need a basic metabolic profile, magnesium level checked to follow-up on electrolytes and renal function.  Patient's chronic medical issues will need to be followed up upon.   Discharge Diagnoses:  Principal Problem:   Abscess of hip Active Problems:   Prosthetic hip infection ([place])   Essential hypertension   Hyperlipidemia associated with type 2 diabetes mellitus (HCC)   DM2 (diabetes mellitus, type 2) (HCC)   Gout   Ovarian cancer ([place])   Hypomagnesemia   Stage 3b chronic kidney disease (CKD) (HCC)   Acute UTI (urinary tract infection)   Hypokalemia   Hyperbilirubinemia   Normocytic anemia   Thrombocytopenia (HCC)   Protein-calorie malnutrition, severe (HCC)   Right hip pain   Peritoneal carcinomatosis (HCC)   Urinary tract infection without hematuria   Pancytopenia (HCC)   Malnutrition of moderate degree   Allergy to cephalosporin   Discharge Condition: Stable and improved  Diet recommendation: Regular diet  Filed Weights   08/27/22 0607 08/29/22 1228 08/31/22 0500  Weight: 64.4 kg 64.4 kg 69.1 kg    History of present illness:  HPI per Kayla Baker is a 86 y.o. female with medical history significant of seasonal allergies, unspecified anemia, osteoarthritis, breast cancer of the left breast, ovarian cancer, history of chemotherapy and radiation therapy, CKD, closed displaced fracture of the right femoral neck, deficiency anemia, type 2 diabetes, GERD, gout on allopurinol, history of benign colon polyps, history  of herpes zoster, hyperlipidemia, hypertension, vitamin D deficiency, ovarian cancer related peritoneal carcinomatosis who is returning to the emergency department due to abdominal pain that feels like a pressure across her abdomen and increased ascites.  Positive occasional nausea and overall decreased appetite, but no diarrhea, constipation, melena or hematocheziaShe has also been having right hip pain for the past week with significant decrease in mobility.  She has had some dysuria that has been getting better and was being treated with amoxicillin for UTI.  No fever, chills or night sweats. No sore throat, rhinorrhea, dyspnea, wheezing or hemoptysis.  No chest pain, palpitations, diaphoresis, PND, orthopnea, but occasionally gets lower extremity edema, particularly on the right side.  .  No polyuria, polydipsia, polyphagia or blurred vision.   ED course: Initial vital signs were temperature 98.5 F, pulse 116, respiration 16, BP 135/72 mmHg O2 sat 94% on room air.  She received hydromorphone 0.5 mg IVP x1, K-Dur 20 mEq p.o. x1, 1000 mL normal saline bolus and 4 mg of ondansetron p.o.   Lab work: Her urinalysis showed moderate hemoglobinuria, ketonuria 5 and proteinuria 30 mg/dL.  There was moderate leukocyte esterase.  21-50 RBC, more than 50 WBC with rare bacteria.  CBC showed a white count of 6.2 with 63% neutrophils, hemoglobin 9.1 g/dL and platelets 145.  Her set rate was 62 mm/h.  Lipase was normal.  CMP showed a potassium of 3.4 mmol/L, the rest of the electrolytes and glucose were normal.  BUN 30 and creatinine 1.14 mg/dL.  Total protein 6.4 and albumin 2.2 g/dL.  AST was 42 units/L.  Normal ALT and  alkaline phosphatase.  Total bilirubin slightly elevated at 1.3 mg/dL.  Lactic acid is normal.   Imaging: CT abdomen/pelvis done 2 days ago showed signs of mild cystitis.  There was interval development of a moderate body myositis which might be related to the compensated liver disease superimposed on  peritoneal carcinomatosis.  There was narrowed appearance of the suprahepatic inferior vena cava just below the heart that it is of uncertain significance.  If persistent or there is worsening without clear cause consider pressure transduction to determine physiologic significance.  There was small amount of fluid adjacent to the right hip in the gluteal musculature measuring 6.1 x 1.8 cm that could be a hematoma from fall or infection/abscess.  There was stable appearance of underlying oncologic findings of peritoneal carcinomatosis.  CT abdomen done today without any significant changes from previous 1, but no mention of gluteal muscle fluid collection.  CT head also done on 9/22 with no acute intracranial normalities.  Right hip x-ray from 2 days ago showing status post right hip arthroplasty but no evidence of hardware failure.  Hospital Course:  #1 acute UTI/acute cystitis without hematuria -Patient noted to have a urine culture from 08/05/2022 with a pansensitive E. coli. -Patient noted to have been treated appropriately in the outpatient setting with amoxicillin. -Status post 3 days IV Unasyn.     2.  Right hip pain, status post previous right hip hemiarthroplasty for fracture/right gluteal fluid collection seen on CT/probable prosthetic hip infection -Patient seen in consultation by orthopedics, IR consulted and fluid aspirated 08/25/2022 with Gram stain showing moderate WBC predominantly neutrophils, no organisms, cultures and cytology pending. -Drain initially place per IR, with serosanguineous fluid noted. -Patient continues to have pain in the right hip on ambulation early on during the hospitalization.  -Patient initially started on IV Unasyn with with cultures with rare Staphylococcus Caprae. -ID consulted, patient seen in consultation by Kayla. Tommy Baker, 08/28/2022 who reviewed imaging, assessed patient and concern for right-sided prosthetic hip infection. -Kayla. Tommy Baker, ID relayed his thoughts  to orthopedics, Kayla Baker and patient taken to the OR, 08/29/2022, s/p I&D and intraoperative cultures. -Patient improved clinically postoperatively. -PT/OT assessed patient.. -ID recommended 6 weeks of targeted IV antibiotics followed by oral antibiotics for total of 6 months versus a year. -IV Unasyn changed to IV daptomycin per ID. -Cultures of staph. caprae Sensitive to methicillin and as such IV antibiotics changed from IV daptomycin to IV cefazolin. -We will need IV cefazolin with EOT of 10/10/2022 with outpatient follow-up with ID 09/23/2022 at 2:45 PM with Kayla. Tommy Baker. -Patient was set up for home health services. -Outpatient follow-up with orthopedics and ID.   3.  History of ovarian cancer, peritoneal carcinomatosis with ascites -HCTZ discontinued and patient started on low-dose Lasix and Aldactone early on during the hospitalization. -Patient initially placed on low-salt diet and fluid restriction, due to protein calorie malnutrition diet was liberalized to regular diet.  -Patient noted to have had paracentesis in the past however remained stable and no need for paracentesis at this time. -Patient seen by primary oncologist, Kayla. Alvy Baker, patient noted to be hypoalbuminemic with third spacing and due to current infection chemotherapy being postponed to next week. -However due to right prosthetic hip infection chemotherapy will be delayed further per oncology. -It is noted that Kayla. Alvy Baker has discussed extensively with patient and family multiple times at the cancer center regarding goals of care and wish to continue aggressive therapy at this time. -Status post IV albumin x1  08/27/2022.   -Outpatient treatment with oncology to be postponed until completion of IV antibiotics. -Outpatient follow-up with oncology.   4.  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 -Status post IV albumin x1. -Outpatient follow-up with PCP.   5.  Hypomagnesemia -Repleted during the hospitalization.   -Patient maintained on oral magnesium supplementation during the hospitalization.    6.  CKD stage IIIb -Noted to be slightly trending up initially when patient started on Lasix and Aldactone, patient noted to have CT contrast 08/24/2022, Lasix and Aldactone started on 08/25/2022. -Lasix and Aldactone subsequently discontinued, his creatinine was trending up and have started to trend back down.  Creatinine on day of discharge was 1.52.   -Outpatient follow-up with PCP.     7.  Hypokalemia/hypomagnesemia -Repleted during the hospitalization. -Patient also maintained on home regimen of oral magnesium supplementation.   8.  Hypertension -Controlled on current regimen of metoprolol 25 mg daily.  -HCTZ discontinued and will not be resumed during the hospitalization.   -Patient was tried on low-dose Aldactone and Lasix however this was discontinued due to rising creatinine.   -Outpatient follow-up with PCP.     9.  Hyperlipidemia -Patient maintained on home regimen rosuvastatin 5 mg 3 times weekly.    10.  Diabetes mellitus type 2 -Hemoglobin A1c 6.2 (06/16/2022). -Patient's oral hypoglycemic agents were held during the hospitalization will be resumed on discharge.   -Outpatient follow-up.     11.  Gout -Patient maintained on home regimen allopurinol.   12.  Hyperbilirubinemia -Resolved.   13.  Normocytic anemia/anemia in neoplastic disease -Patient with no overt bleeding. -Hemoglobin went as low as 7.1 during the hospitalization, patient received a total of 3 units packed red blood cells during this hospitalization hemoglobin was up to 10.6 by day of discharge.  -FOBT done was negative.   -Outpatient follow-up with hematology/oncology.     14.  Pancytopenia -Likely due to recent  treatment. -Hemoglobin at went as low as 7.1, patient received a total of 3 units packed red blood cells during this hospitalization with hemoglobin at 10.6 by day of discharge.   -FOBT negative. -Platelet count stable. -Outpatient follow-up with hematology/oncology.   15.  Severe protein calorie malnutrition -Patient was maintained on nutritional supplementation and diet liberalized to a regular diet. -Outpatient follow-up with PCP and hematology/oncology.    Procedures: 10 French drain placed in the right hip fluid collection per IR, KaylaMir 08/25/2022 CT abdomen and pelvis 08/24/2022 Lower extremity Dopplers 08/22/2022 Incision and drainage of right hip with exchange of modular components: Bipolar head ball per Kayla. Zachery Baker 08/29/2022 Transfusion 1 unit packed red blood cells 08/29/2022. Transfusion 2 units packed red blood cells pending 09/01/2022    Consultations: Orthopedics: Kayla. Mardelle Matte 08/24/2022 Oncology: Kayla. Alvy Baker 08/26/2022 ID: Kayla. Tommy Baker 08/28/2022  Discharge Exam: Vitals:   09/02/22 1339 09/02/22 1339  BP:  (!) 96/56  Pulse: 91   Resp: 18   Temp: 98.3 F (36.8 C)   SpO2: 94%     General: NAD Cardiovascular: RRR no murmurs rubs or gallops.  No JVD.  2+ bilateral lower extremity edema.   Respiratory: Clear to auscultation bilaterally.  No wheezes, no crackles, no rhonchi.  Fair air movement.  Discharge Instructions   Discharge Instructions     [facility] pharmacist to adjust dose for Vancomycin, Aminoglycosides and  other anti-infective therapies as requested by physician.   Complete by: As directed    [facility] to provide Cath Flo 60m   Complete by: As directed    Administer for PICC line occlusion and as ordered by physician for other access device issues.   Anaphylaxis Kit: Provided to treat any anaphylactic reaction to the medication being provided to the patient if First Dose or when requested by physician   Complete by: As  directed    Epinephrine 155mml vial / amp: Administer 0.37m76m0.37ml62mubcutaneously once for moderate to severe anaphylaxis, nurse to call physician and pharmacy when reaction occurs and call 911 if needed for immediate care   Diphenhydramine 50mg84mIV vial: Administer 25-50mg 67mM PRN for first dose reaction, rash, itching, mild reaction, nurse to call physician and pharmacy when reaction occurs   Sodium Chloride 0.9% NS 500ml I637mdminister if needed for hypovolemic blood pressure drop or as ordered by physician after call to physician with anaphylactic reaction   Change dressing on IV access line weekly and PRN   Complete by: As directed    Diet general   Complete by: As directed    Flush IV access with Sodium Chloride 0.9% and Heparin 10 units/ml or 100 units/ml   Complete by: As directed    Home infusion instructions - [facility]   Complete by: As directed    Instructions: Flush IV access with Sodium Chloride 0.9% and Heparin 10units/ml or 100units/ml   Change dressing on IV access line: Weekly and PRN   Instructions Cath Flo 2mg: Ad51mister for PICC Line occlusion and as ordered by physician for other access device   [facility] pharmacist to adjust dose for: Vancomycin, Aminoglycosides and other anti-infective therapies as requested by physician   Increase activity slowly   Complete by: As directed    Method of administration may be changed at the discretion of home infusion pharmacist based upon assessment of the patient and/or caregiver's ability to self-administer the medication ordered   Complete by: As directed    No wound care   Complete by: As directed       Allergies as of 09/02/2022       Reactions   Mobic [meloxicam] Swelling   Prednisone Swelling, Other (See Comments)   Can Not take High doses   Premarin [estrogens Conjugated] Hives   Trovan [alatrofloxacin] Other (See Comments)   Unknown reaction        Medication List     STOP  taking these medications    amoxicillin 500 MG tablet Commonly known as: AMOXIL   hydrochlorothiazide 25 MG tablet Commonly known as: HYDRODIURIL       TAKE these medications    acetaminophen 500 MG tablet Commonly known as: TYLENOL Take 500 mg by mouth every 6 (six) hours as needed for mild pain, headache or fever.   allopurinol 100 MG tablet Commonly known as: ZYLOPRIM Take 1 tablet (100 mg total) by mouth daily. FOR GOUT PREVENTION, What changed: additional instructions   ascorbic acid 500 MG tablet Commonly known as: VITAMIN C Take 500 mg by mouth every evening.   aspirin EC 81 MG tablet Take 1 tablet (81 mg total) by mouth 2 (two) times daily. Swallow whole.   ceFAZolin  IVPB Commonly known as: ANCEF Inject 2 g into the vein every 12 (twelve) hours. Indication:  Prosthetic right hip joint infection  First Dose: Yes Last Day of Therapy:  10/10/22 Labs - Once weekly:  CBC/D and BMP, Labs - Every other week:  ESR and CRP Method of administration: IV Push Method of administration may be changed at the discretion of home infusion pharmacist based upon assessment of the patient and/or caregiver's ability to self-administer the medication ordered.   cyanocobalamin 1000 MCG tablet Take 1,000 mcg by mouth daily.   dexamethasone 4 MG tablet Commonly known as: DECADRON Take 2 tablets the morning before chemotherapy and then 2 tablets daily for 2 days after chemo   Ensure Max Protein Liqd Take 330 mLs (11 oz total) by mouth 2 (two) times daily.   ezetimibe 10 MG tablet Commonly known as: ZETIA TAKE 1 TABLET BY MOUTH DAILY FOR CHOLESTEROL What changed:  how much to take how to take this when to take this additional instructions   gabapentin 300 MG capsule Commonly known as: NEURONTIN Take 1 capsule 4 x  /day for Pain What changed:  how much to take how to take this when to take this additional instructions   glimepiride 1 MG tablet Commonly known as:  Amaryl Take 1 tab by mouth in the morning if fasting is running 150+. Please do not take this medication if you are ill or not eating as this can cause a low blood sugar. What changed:  how much to take how to take this when to take this additional instructions   lidocaine-prilocaine cream Commonly known as: EMLA Apply 1 application. topically daily as needed (for port access).   magnesium oxide 400 (240 Mg) MG tablet Commonly known as: MAG-OX Take 1 tablet (400 mg total) by mouth daily.   metoprolol succinate 25 MG 24 hr tablet Commonly known as: TOPROL-XL Take  1 tablet  Daily  for BP       /TAKE 1 TABLET BY MOUTH DAILY FOR BLOOD PRESSURE What changed:  how much to take how to take this when to take this additional instructions   ondansetron 8 MG tablet Commonly known as: Zofran Take 1 tablet (8 mg total) by mouth every 8 (eight) hours as needed for refractory nausea / vomiting.   oxyCODONE 5 MG immediate release tablet Commonly known as: Oxy IR/ROXICODONE Take 0.5-1 tablets (2.5-5 mg total) by mouth every 4 (four) hours as needed for severe pain or moderate pain.   pantoprazole 40 MG tablet Commonly known as: PROTONIX Take 1 tablet (40 mg total) by mouth daily. Start taking on: September 03, 2022   polyethylene glycol 17 g packet Commonly known as: MiraLax Take 17 g by mouth 2 (two) times daily. 17 grams in 6 oz of favorite drink twice a day until bowel movement.  LAXITIVE.  Restart if two days since last bowel movement What changed: additional instructions   rosuvastatin 5 MG tablet Commonly known as: CRESTOR TAKE 1 TABLET BY MOUTH IN THE EVENING THREE TIMES WEEKLY FOR CHOLESTEROL What changed: See the new instructions. Notes to patient: Monday, Wednesday, Friday   trolamine salicylate 10 % cream Commonly known as: ASPERCREME Apply 1 application topically as needed for muscle pain (Use if you still feel tingles in feet in the morning after taking Gabapentin).    Vitamin D3 125 MCG (5000 UT) Caps Take 5,000 Units by mouth daily.               Discharge Care Instructions  (From admission, onward)           Start     Ordered   09/02/22 0000  Change dressing on IV access line weekly and PRN  (  Home infusion instructions - [facility] )        09/02/22 1135           Allergies  Allergen Reactions   Mobic [Meloxicam] Swelling   Prednisone Swelling and Other (See Comments)    Can Not take High doses   Premarin [Estrogens Conjugated] Hives   Trovan [Alatrofloxacin] Other (See Comments)    Unknown reaction    Follow-up Information     Willaim Sheng, MD Follow up in 1 week(s).   Specialty: Orthopedic Surgery Contact information: [place] [address] [phone]         Tommy Baker, Lavell Islam, MD Follow up on 09/23/2022.   Specialty: Infectious Diseases Why: Follow-up at 2:45 PM Contact information: [address] [address] [phone]         Heath Lark, MD Follow up.   Specialty: Hematology and Oncology Why: Follow-up as scheduled. Contact information: [place] [address] [phone]         Kayla Pinto, MD. Schedule an appointment as soon as possible for a visit in 2 week(s).   Specialty: Internal Medicine Why: Follow-up in 2 to 3 weeks. Contact information: [address] [place] [address] [phone]                  The results of significant diagnostics from this hospitalization (including imaging, microbiology, ancillary and laboratory) are listed below for reference.    Significant Diagnostic Studies: DG HIP UNILAT W OR W/O PELVIS 2-3 VIEWS RIGHT  Result Date: 08/29/2022 CLINICAL DATA:  Status post right hip replacement EXAM: DG HIP (WITH OR WITHOUT PELVIS) 3V RIGHT COMPARISON:  08/22/2022 FINDINGS: Right hip replacement is noted. Pelvic ring is intact. Surgical  wound VAC is noted on the right. Pelvic ring is intact. IMPRESSION: Changes consistent with the known right hip replacement. No acute abnormality noted. Electronically Signed   By: Inez Catalina M.D.   On: 08/29/2022 20:15   [place] DRAIN/INJ MAJOR JOINT/BURSA  Result Date: 08/25/2022 INDICATION: 86 year old woman with history of ovarian malignancy and prior right hip replacement presents to IR for drainage of fluid collection overlying the right hip. EXAM: Ultrasound-guided drain placement in fluid collection overlying right hip. MEDICATIONS: The patient is currently admitted to the hospital and receiving intravenous antibiotics. The antibiotics were administered within an appropriate time frame prior to the initiation of the procedure. ANESTHESIA/SEDATION: None COMPLICATIONS: None immediate. PROCEDURE: Informed written consent was obtained from the patient after a thorough discussion of the procedural risks, benefits and alternatives. All questions were addressed. Maximal Sterile Barrier Technique was utilized including caps, mask, sterile gowns, sterile gloves, sterile drape, hand hygiene and skin antiseptic. A timeout was performed prior to the initiation of the procedure. Patient positioned left lateral decubitus on the procedure table. The skin overlying the right hip was prepped and draped in usual fashion. Ultrasound evaluation of the right hip again showed the fluid collection between the gluteal musculature. Following local lidocaine administration, the right hip fluid collection was accessed with 19 gauge Yueh needle utilizing continuous ultrasound guidance. Yellow cloudy fluid was aspirated and sent for Gram stain and culture, as well as, cytology. The Yueh catheter was exchanged for 10.2 [place] multipurpose pigtail catheter over 0.035 inch guidewire. Drain was secured to skin with suture and connected to bulb suction. IMPRESSION: Ultrasound-guided drain placement in fluid collection overlying the right  hip. 20 mL of cloudy yellow fluid  was aspirated and sent for Gram stain, culture, and cytology. Electronically Signed   By: Miachel Roux M.D.   On: 08/25/2022 13:34   Inpatient: VAS [place] PAD ABI  Result Date: 08/25/2022  LOWER EXTREMITY DOPPLER STUDY Patient Name:  CHARLEI RAMSARAN  Date of Exam:   08/25/2022 Medical Rec #: [id number]    Accession #:    [id number] Date of Birth: [birth year]    Patient Gender: F Patient Age:   77 years Exam Location:  [facility] Procedure:      VAS [place] ABI WITH/WO TBI Referring Phys: Merlene Pulling --------------------------------------------------------------------------------  Indications: RLE pain and redness (PAD ABI) High Risk Factors: Hypertension, hyperlipidemia, Diabetes, no history of                    smoking. Other Factors: CKD.  Comparison Study: No previous exams Performing Technologist: Hill, Jody RVT, RDMS  Examination Guidelines: A complete evaluation includes at minimum, Doppler waveform signals and systolic blood pressure reading at the level of bilateral brachial, anterior tibial, and posterior tibial arteries, when vessel segments are accessible. Bilateral testing is considered an integral part of a complete examination. Photoelectric Plethysmograph (PPG) waveforms and toe systolic pressure readings are included as required and additional duplex testing as needed. Limited examinations for reoccurring indications may be performed as noted.  ABI Findings: +---------+------------------+-----+---------+--------+ Right    Rt Pressure (mmHg)IndexWaveform Comment  +---------+------------------+-----+---------+--------+ Brachial 145                    triphasic         +---------+------------------+-----+---------+--------+ PTA      174               1.20 biphasic          +---------+------------------+-----+---------+--------+ DP       151               1.04 biphasic          +---------+------------------+-----+---------+--------+ Great Toe144                0.99 Normal            +---------+------------------+-----+---------+--------+ +---------+------------------+-----+---------+---------------------------+ Left     Lt Pressure (mmHg)IndexWaveform Comment                     +---------+------------------+-----+---------+---------------------------+ Brachial                        triphasicHX of breast cancer - no BP +---------+------------------+-----+---------+---------------------------+ PTA      184               1.27 biphasic                             +---------+------------------+-----+---------+---------------------------+ DP       162               1.12 biphasic                             +---------+------------------+-----+---------+---------------------------+ Great Toe138               0.95 Normal                               +---------+------------------+-----+---------+---------------------------+ +-------+-----------+-----------+------------+------------+ ABI/TBIToday's ABIToday's TBIPrevious ABIPrevious TBI +-------+-----------+-----------+------------+------------+ Right  1.20  0.99                                +-------+-----------+-----------+------------+------------+ Left   1.27       0.95                                +-------+-----------+-----------+------------+------------+   Summary: Right: Resting right ankle-brachial index is within normal range. Left: Resting left ankle-brachial index is within normal range. *See table(s) above for measurements and observations.  Electronically signed by Harold Barban MD on 08/25/2022 at 11:32:59 AM.    Final    CT ABDOMEN PELVIS W CONTRAST  Addendum Date: 08/24/2022   ADDENDUM REPORT: 08/24/2022 15:12 ADDENDUM: In addition, a persistent fluid collection with thin enhancing rim is seen in the right gluteal muscles, superficial to the greater trochanter of the right hip. This measures 6.2 x 2.2 cm and shows no significant change  compared to recent study 2 days ago. Electronically Signed   By: Marlaine Hind M.D.   On: 08/24/2022 15:12   Result Date: 08/24/2022 CLINICAL DATA:  Worsening diffuse abdominal pain. Ovarian carcinoma. * Tracking Code: BO * EXAM: CT ABDOMEN AND PELVIS WITH CONTRAST TECHNIQUE: Multidetector CT imaging of the abdomen and pelvis was performed using the standard protocol following bolus administration of intravenous contrast. RADIATION DOSE REDUCTION: This exam was performed according to the departmental dose-optimization program which includes automated exposure control, adjustment of the mA and/or kV according to patient size and/or use of iterative reconstruction technique. CONTRAST:  171m OMNIPAQUE IOHEXOL 300 MG/ML  SOLN COMPARISON:  08/22/2022 FINDINGS: Lower Chest: No acute findings. Stable tiny bilateral pleural effusions. Hepatobiliary: Hepatic cirrhosis again demonstrated. No hepatic masses identified. Recanalization of paraumbilical veins again seen, consistent with portal venous hypertension. Gallbladder is unremarkable. No evidence of biliary ductal dilatation. Pancreas:  No mass or inflammatory changes. Spleen: Stable splenomegaly, consistent with portal venous hypertension. Adrenals/Urinary Tract: No suspicious masses identified. No evidence of ureteral calculi or hydronephrosis. Stomach/Bowel: Stable mild diffuse gastric wall thickening, most likely due to portal gastropathy in setting of cirrhosis. No evidence of obstruction, acute inflammatory process, or abscess. Diverticulosis is seen mainly involving the sigmoid colon, however there is no evidence of diverticulitis. Vascular/Lymphatic: Stable sub-cm retroperitoneal lymph nodes in left para-aortic region. No pathologically enlarged lymph nodes. No acute vascular findings. Aortic atherosclerotic calcification incidentally noted. Reproductive: Prior hysterectomy. Peritoneal thickening and nodularity is again seen in the pelvis. Focal site in right  adnexal region measures 4.2 x 2.5 cm, without significant change since prior study. Soft tissue stranding throughout the omental fat remains stable, with a few sub-centimeter peritoneal soft tissue densities in the left anterior abdomen. Moderate ascites, without significant change. Other:  None. Musculoskeletal:  No suspicious bone lesions identified. 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 Hepatic cirrhosis and findings of portal venous hypertension. No evidence of hepatic neoplasm. Colonic diverticulosis, without radiographic evidence of diverticulitis. Aortic Atherosclerosis (ICD10-I70.0). Electronically Signed: By: JMarlaine HindM.D. On: 08/24/2022 13:07   VAS [place]OWER EXTREMITY VENOUS (DVT) (7a-7p)  Result Date: 08/22/2022  Lower Venous DVT Study Patient Name:  CJEM CASTROCOX  Date of Exam:   08/22/2022 Medical Rec #: [id number]   Accession #:    [id number] of Birth: 501-13-1937   Patient Gender: F Patient Age:   89years Exam Location:  [facility] Procedure:      VAS [place] LOWER EXTREMITY VENOUS (DVT) Referring Phys: Nicki Reaper [place] --------------------------------------------------------------------------------  Indications: Edema.  Limitations: Poor ultrasound/tissue interface and patient positioning. Comparison Study: No prior studies. Performing Technologist: Oliver Hum RVT  Examination Guidelines: A complete evaluation includes B-mode imaging, spectral Doppler, color Doppler, and power Doppler as needed of all accessible portions of each vessel. Bilateral testing is considered an integral part of a complete examination. Limited examinations for reoccurring indications may be performed as noted. The reflux portion of the exam is performed with the patient in reverse Trendelenburg.  +---------+---------------+---------+-----------+----------+--------------+ RIGHT    CompressibilityPhasicitySpontaneityPropertiesThrombus Aging  +---------+---------------+---------+-----------+----------+--------------+ CFV      Full           Yes      Yes                                 +---------+---------------+---------+-----------+----------+--------------+ SFJ      Full                                                        +---------+---------------+---------+-----------+----------+--------------+ FV Prox  Full                                                        +---------+---------------+---------+-----------+----------+--------------+ FV Mid   Full                                                        +---------+---------------+---------+-----------+----------+--------------+ FV Distal               Yes      Yes                                 +---------+---------------+---------+-----------+----------+--------------+ PFV      Full                                                        +---------+---------------+---------+-----------+----------+--------------+ POP      Full           Yes      Yes                                 +---------+---------------+---------+-----------+----------+--------------+ PTV      Full                                                        +---------+---------------+---------+-----------+----------+--------------+ PERO     Full                                                        +---------+---------------+---------+-----------+----------+--------------+   +----+---------------+---------+-----------+----------+--------------+  LEFTCompressibilityPhasicitySpontaneityPropertiesThrombus Aging +----+---------------+---------+-----------+----------+--------------+ CFV Full           Yes      Yes                                 +----+---------------+---------+-----------+----------+--------------+     Summary: RIGHT: - There is no evidence of deep vein thrombosis in the lower extremity. However, portions of this examination were limited- see  technologist comments above.  - No cystic structure found in the popliteal fossa.  LEFT: - No evidence of common femoral vein obstruction.  *See table(s) above for measurements and observations. Electronically signed by Jamelle Haring on 08/22/2022 at 10:46:19 PM.    Final    CT ABDOMEN PELVIS W CONTRAST  Result Date: 08/22/2022 CLINICAL DATA:  A female at age 3 with history of ovarian cancer presents for evaluation of abdominal pain. * Tracking Code: BO * EXAM: CT ABDOMEN AND PELVIS WITH CONTRAST TECHNIQUE: Multidetector CT imaging of the abdomen and pelvis was performed using the standard protocol following bolus administration of intravenous contrast. RADIATION DOSE REDUCTION: This exam was performed according to the departmental dose-optimization program which includes automated exposure control, adjustment of the mA and/or kV according to patient size and/or use of iterative reconstruction technique. CONTRAST:  140m OMNIPAQUE IOHEXOL 300 MG/ML  SOLN COMPARISON:  August 05, 2022. FINDINGS: Lower chest: Basilar atelectasis. No effusion. No consolidative changes. Central venous access device terminates in the upper RIGHT atrium. No substantial pericardial effusion or thickening. Hepatobiliary: Perihepatic ascites increased since June 11, 2022. Low-density area adjacent to the supra hepatic inferior vena cava which shows a "narrowed" appearance but appears patent, similar appearance to pre treatment images from May of 2021 where there was a similar volume of ascites and narrowed appearance. Perhaps this is related to mild mass effect or physiologic changes, this is not clear. As compared to noncontrast imaging from August 05, 2022 this may be similar but it represents a change compared to June 11, 2022 and the inferior vena cava appears [facility]. No pericholecystic stranding. No gallbladder distension or biliary duct dilation. Fissural widening of hepatic fissures. Pancreas:  Generalized edema throughout the abdomen without substantial asymmetric edema about the pancreas or peripancreatic fluid. No ductal dilation. Preservation of normal pancreatic parenchymal architecture. Spleen: Perisplenic ascites. No parenchymal lesion. Mild splenomegaly. Adrenals/Urinary Tract: Adrenal glands are normal. Symmetric renal enhancement without hydronephrosis. Marked irregularity of the low volume urinary bladder that displays extensive mucosal enhancement. Limited assessment due to streak artifact from RIGHT hip arthroplasty. No suspicious renal lesion. No ureteral distension. Stomach/Bowel: Enhancement in the pelvis of fascial planes and along the serosa of the sigmoid colon is similar in terms of distribution now associated with more ascites than on the study of June 11 2022. When compared to the more recent study of August 05, 2022 and implant in the RIGHT pelvis measuring 4.8 x 3.2 cm is unchanged though the volume of ascites again is moderate and new in the very short interval since the prior study. Bowel floats in ascites. The appendix is not visible though there are no secondary signs to suggest acute appendiceal process. Stool fills much of the colon without signs of colonic obstruction and with serosal and pelvic disease as outlined above extending across the top of the adjacent urinary bladder in the area of the sigmoid. Vascular/Lymphatic: Small retroperitoneal lymph nodes are unchanged compared to recent imaging largest along the LEFT  para-aortic chain. Patent abdominal vessels on venous phase. Calcified and noncalcified aortic atherosclerotic plaque. Reproductive: Post hysterectomy with soft tissue in the pelvis as discussed. Other: Increase in ascites as discussed. No pneumoperitoneum. Stable appearance of peritoneal nodularity over the short interval, for example in addition to the pelvic soft tissue there are diffuse areas of omental and peritoneal nodularity also an implant along the  RIGHT hemiabdomen measuring 2.5 x 1.3 cm. Musculoskeletal: Post RIGHT hip arthroplasty. Small amount of fluid adjacent to the RIGHT hip in the gluteal musculature with some peripheral enhancement (image 76/2) perhaps mild stranding in this area on the previous study with tract from prior postoperative change in the subcutaneous fat. This intramuscular collection was however not present in July. It currently measures 6.1 x 1.8 cm No destructive bone process or acute bone finding. IMPRESSION: 1. Signs of marked cystitis, not well evaluated. 2. Interval development of moderate volume ascites in the short interval, more similar to more remote images. Significance is uncertain but could be related to decompensation of underlying liver disease superimposed on signs of peritoneal carcinomatosis. Sampling of this fluid may be helpful. Would also correlate with hepatic function. 3. Narrowed appearance of the supra hepatic inferior vena cava just below the heart is of uncertain significance. Small volume ascites in this area without soft tissue. The inferior vena cava appears more engorged below this level than on previous imaging. Given that this is just fluid density in the area mass effect is felt unlikely in the appearance is not substantially different from 2021. Close attention on follow-up is suggested. If this is a persistent abnormality or there is worsening without clear cause could consider pressure transduction above and below this area it to determine whether this is of physiologic significance. 4. Small amount of fluid adjacent to the RIGHT hip in the gluteal musculature with some peripheral enhancement. This was however not present in July. It currently measures 6.1 x 1.8 cm. This was not present in July and may represent hematoma from fall or infection/abscess. Correlate with any interval surgical revision of this hip since July. 5. Stable appearance of underlying oncologic findings of peritoneal  carcinomatosis and retroperitoneal adenopathy. 6. Mild splenomegaly may be due to underlying liver disease and portal hypertension. Aortic Atherosclerosis (ICD10-I70.0). Electronically Signed   By: Zetta Bills M.D.   On: 08/22/2022 16:22   CT Head Wo Contrast  Result Date: 08/22/2022 CLINICAL DATA:  Altered mental status, history of ovarian cancer EXAM: CT HEAD WITHOUT CONTRAST TECHNIQUE: Contiguous axial images were obtained from the base of the skull through the vertex without intravenous contrast. RADIATION DOSE REDUCTION: This exam was performed according to the departmental dose-optimization program which includes automated exposure control, adjustment of the mA and/or kV according to patient size and/or use of iterative reconstruction technique. COMPARISON:  None Available. FINDINGS: Brain: No evidence of acute infarction, hemorrhage, mass, mass effect, or midline shift. No hydrocephalus or extra-axial fluid collection. Periventricular white matter changes, likely the sequela of chronic small vessel ischemic disease. Vascular: No hyperdense vessel. Atherosclerotic calcifications in the intracranial carotid and vertebral arteries. Skull: Normal. Negative for fracture or focal lesion. Sinuses/Orbits: No acute finding. Status post bilateral lens replacements. Other: The mastoid air cells are well aerated. IMPRESSION: No acute intracranial process. Electronically Signed   By: Merilyn Baba M.D.   On: 08/22/2022 13:07   DG Hip Unilat With Pelvis 2-3 Views Right  Result Date: 08/22/2022 CLINICAL DATA:  Right hip pain.  Previous hip replacement. EXAM: DG HIP (  WITH OR WITHOUT PELVIS) 2-3V RIGHT COMPARISON:  Right hip radiographs 11/08/2021 FINDINGS: Redemonstration of right hip bipolar hemiarthroplasty. No perihardware lucency is seen to indicate hardware failure or loosening. Mild superior left femoroacetabular joint space narrowing. Surgical clips overlie the left hemipelvis. IMPRESSION: Status post right  hip arthroplasty without evidence of hardware failure. Electronically Signed   By: Yvonne Kendall M.D.   On: 08/22/2022 12:41   CT ABDOMEN PELVIS WO CONTRAST  Result Date: 08/05/2022 CLINICAL DATA:  Left-sided abdominal pain. History of ovarian cancer. * Tracking Code: BO * EXAM: CT ABDOMEN AND PELVIS WITHOUT CONTRAST TECHNIQUE: Multidetector CT imaging of the abdomen and pelvis was performed following the standard protocol without IV contrast. RADIATION DOSE REDUCTION: This exam was performed according to the departmental dose-optimization program which includes automated exposure control, adjustment of the mA and/or kV according to patient size and/or use of iterative reconstruction technique. COMPARISON:  06/11/2022 FINDINGS: Lower chest: The lung bases are clear of an acute process. Minimal streaky bibasilar atelectasis. The heart is normal in size. No pericardial effusion. Hepatobiliary: No hepatic lesions are identified without contrast. No intrahepatic biliary dilatation. Small layering gallstones suspected in the gallbladder. No findings for acute cholecystitis. No common bile duct dilatation. Pancreas: Diffuse inflammation/edema in and around the pancreas consistent with diffuse interstitial pancreatitis. Spleen: Stable mild splenomegaly. Adrenals/Urinary Tract: Adrenal glands and kidneys are. Bladder is grossly normal. Stomach/Bowel: The stomach, duodenum, small bowel and colon are grossly no. Vascular/Lymphatic: Moderate atherosclerotic calcification involving the aorta but no aneurysm. Stable scattered mesenteric and retroperitoneal lymph nodes and progressive peritoneal carcinomatosis. Numerous omental and peritoneal implants slightly progressive when compared to the prior study. The soft tissue lesion in the right lower quadrant adjacent to the ascending colon measures 2.6 x 2.1 cm and previously measured 2.5 x 2.0 cm. Multiple pelvic implants are grossly stable to slightly enlarged. Reproductive:  Surgically absent. Other: Small amount of free pelvic fluid noted. Musculoskeletal: No significant bony findings. IMPRESSION: 1. 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 2. Slight interval progression of peritoneal carcinomatosis. Aortic Atherosclerosis (ICD10-I70.0). Electronically Signed   By: Marijo Sanes M.D.   On: 08/05/2022 14:20   DG Chest Port 1 View  Result Date: 08/05/2022 CLINICAL DATA:  Weakness with decreased p.o. intake. EXAM: PORTABLE CHEST 1 VIEW COMPARISON:  November 07, 2021 FINDINGS: EKG leads project over the chest. RIGHT-sided Port-A-Cath terminating at the caval to atrial junction. Mild rotation to the RIGHT. Accounting for this cardiomediastinal contours and hilar structures are normal. Lungs are clear.  No sign of effusion.  No pneumothorax. Post LEFT breast lumpectomy. On limited assessment no acute skeletal findings. IMPRESSION: 1. No acute cardiopulmonary disease. Electronically Signed   By: Zetta Bills M.D.   On: 08/05/2022 09:59    Microbiology: Recent Results (from the past 240 hour(s))  Aerobic/Anaerobic Culture w Gram Stain (surgical/deep wound)     Status: None   Collection Time: 08/25/22 12:36 PM   Specimen: Abscess  Result Value Ref Range Status   Specimen Description   Final    ABSCESS RIGHT HIP Performed at [facility] [address] [place], [place] [address]    Special Requests   Final    NONE Performed at [facility], [place] [address] [place], [place] [address]    Gram Stain   Final    MODERATE WBC PRESENT, PREDOMINANTLY PMN NO ORGANISMS SEEN    Culture   Final    RARE STAPHYLOCOCCUS CAPRAE NO ANAEROBES ISOLATED Performed  at [facility], [place] [address], [place], [place] [address]    Report Status 08/31/2022 FINAL  Final   Organism ID, Bacteria STAPHYLOCOCCUS CAPRAE  Final      Susceptibility   Staphylococcus caprae - MIC*    CIPROFLOXACIN <=0.5 SENSITIVE Sensitive      ERYTHROMYCIN 0.5 SENSITIVE Sensitive     GENTAMICIN <=0.5 SENSITIVE Sensitive     OXACILLIN <=0.25 SENSITIVE Sensitive     TETRACYCLINE <=1 SENSITIVE Sensitive     VANCOMYCIN <=0.5 SENSITIVE Sensitive     TRIMETH/SULFA <=10 SENSITIVE Sensitive     CLINDAMYCIN <=0.25 SENSITIVE Sensitive     RIFAMPIN <=0.5 SENSITIVE Sensitive     Inducible Clindamycin NEGATIVE Sensitive     * RARE STAPHYLOCOCCUS CAPRAE  Surgical pcr screen     Status: None   Collection Time: 08/29/22 12:54 AM   Specimen: Nasal Mucosa; Nasal Swab  Result Value Ref Range Status   MRSA, PCR NEGATIVE NEGATIVE Final   Staphylococcus aureus NEGATIVE NEGATIVE Final    Comment: (NOTE) The Xpert SA Assay (FDA approved for NASAL specimens in patients 22 years of age and older), is one component of a comprehensive surveillance program. It is not intended to diagnose infection nor to guide or monitor treatment. Performed at [facility], [place] [address] [place], [place] [address]   Aerobic/Anaerobic Culture w Gram Stain (surgical/deep wound)     Status: None (Preliminary result)   Collection Time: 08/29/22  6:08 PM   Specimen: PATH Other; Tissue  Result Value Ref Range Status   Specimen Description   Final    TISSUE RIGHT HIP 1 Performed at [facility] [address], [place], [place] [address]    Special Requests   Final    NONE Performed at [facility], [place] [address] [place], [place] [address]    Gram Stain NO WBC SEEN NO ORGANISMS SEEN   Final   Culture   Final    NO GROWTH 4 DAYS NO ANAEROBES ISOLATED; CULTURE IN PROGRESS FOR 5 DAYS Performed at [facility] [address], [place], [place] [address]    Report Status PENDING  Incomplete  Aerobic/Anaerobic Culture w Gram Stain (surgical/deep wound)     Status: None (Preliminary result)   Collection Time: 08/29/22  6:17 PM   Specimen: PATH Other; Tissue  Result Value Ref Range Status   Specimen  Description   Final    TISSUE RIGHT HIP 2 Performed at [facility], [place] [address] [place], [place] [address]    Special Requests   Final    NONE Performed at [facility], [place] [address] [place], [place] [address]    Gram Stain NO WBC SEEN NO ORGANISMS SEEN   Final   Culture   Final    NO GROWTH 4 DAYS NO ANAEROBES ISOLATED; CULTURE IN PROGRESS FOR 5 DAYS Performed at [facility] [address] [place],  [address]    Report Status PENDING  Incomplete  Aerobic/Anaerobic Culture w Gram Stain (surgical/deep wound)     Status: None (Preliminary result)   Collection Time: 08/29/22  6:18 PM   Specimen: PATH Other; Tissue  Result Value Ref Range Status   Specimen Description   Final    TISSUE RIGHT HIP 3 Performed at [facility], [place] [address], [place],  [address]    Special Requests   Final    NONE Performed at [facility]  [place] [address], [place], [place] [address]    Gram Stain NO WBC SEEN NO ORGANISMS SEEN   Final   Culture   Final    NO GROWTH 4 DAYS NO ANAEROBES ISOLATED; CULTURE IN PROGRESS FOR 5 DAYS Performed at [facility] [address] [place], [place] [address]    Report Status PENDING  Incomplete     Labs: Basic Metabolic Panel: Recent Labs  Lab 08/28/22 0602 08/29/22 0623 08/30/22 0600 08/31/22 0531 09/01/22 0612 09/02/22 0500  NA 138 137 137 138 139 137  K 3.5 3.7 4.4 4.0 3.8 3.6  CL 108 109 109 111 111 105  CO2 24 23 21* _0 GLUCOSE 90 104* 147* 153* 101* 106*  BUN 20 25* 28* 39* 37* 35*  CREATININE 1.73* 1.65* 1.55* 1.65* 1.53* 1.52*  CALCIUM 8.7* 8.7* 8.7* 8.6* 8.6* 8.5*  MG 1.3* 2.4 2.0  --  1.6* 2.1   Liver Function Tests: No results for input(s): "AST", "ALT", "ALKPHOS", "BILITOT", "PROT", "ALBUMIN" in the last 168 hours. No results for input(s): "LIPASE", "AMYLASE" in the last 168 hours. No results for input(s): "AMMONIA"  in the last 168 hours. CBC: Recent Labs  Lab 08/29/22 0623 08/30/22 0600 08/31/22 0531 08/31/22 1601 09/01/22 0612 09/02/22 0500  WBC 4.7 5.3 11.3* 12.0* 6.1 5.4  NEUTROABS 2.8 4.2 9.1*  --  4.2 3.5  HGB 7.3* 8.7* 7.2* 7.6* 7.1* 10.6*  HCT 23.7* 27.2* 22.9* 24.0* 23.0* 31.8*  MCV 99.2 96.8 100.0 99.6 100.0 93.3  PLT 98* 101* 99* 101* 84* 82*   Cardiac Enzymes: Recent Labs  Lab 08/29/22 1139  CKTOTAL 23*   BNP: BNP (last 3 results) No results for input(s): "BNP" in the last 8760 hours.  ProBNP (last 3 results) No results for input(s): "PROBNP" in the last 8760 hours.  CBG: Recent Labs  Lab 09/01/22 1151 09/01/22 1718 09/02/22 0003 09/02/22 0551 09/02/22 1231  GLUCAP 110* 141* 132* 103* 150*       Signed:  Irine Seal MD.  [facility] 09/02/2022, 2:39 PM

## 2022-09-02 NOTE — TOC Transition Note (Addendum)
Transition of Care [facility]) - CM/SW Discharge Note   Patient Details  Name: Kayla Baker MRN: [id number] Date of Birth: [birth year]  Transition of Care [facility]) CM/SW Contact:  Ross Ludwig, LCSW Phone Number: 09/02/2022, 4:55 PM   Clinical Narrative:     Patient had [facility] in the past, and was agreeable to having them follow patient again upon discharge.  Patient has been set up with [facility] RN through [place], and home IV antibiotics through [facility].  Pam from [facility] is aware that patient is discharging today and has the IV medications.  Final next level of care: [place] Barriers to Discharge: Barriers Resolved   Patient Goals and CMS Choice Patient states their goals for this hospitalization and ongoing recovery are:: To return back home with home health. CMS Medicare.gov Compare Post Acute Care list provided to:: Patient Choice offered to / list presented to : Patient  Discharge Placement                       Discharge Plan and Services                DME Arranged: IV pump/equipment   Date DME Agency Contacted: 09/02/22 Time DME Agency Contacted: 1100 Representative spoke with at DME Agency: [place]: RN [place] Agency: [place] Date [place]: 09/02/22 Time [place]: [id number] Representative spoke with at [place]: Amy  Social Determinants of Health (SDOH) Interventions Food Insecurity Interventions: Intervention Not Indicated Utilities Interventions: Intervention Not Indicated   Readmission Risk Interventions    08/26/2022   10:27 AM  Readmission Risk Prevention Plan  Transportation Screening Complete  PCP or Specialist Appt within 3-5 Days Complete  HRI or [place] Complete  Social Work Consult for [place] Planning/Counseling Complete  Palliative Care Screening Not Applicable  Medication Review Press photographer) Complete

## 2022-09-03 DIAGNOSIS — E1122 Type 2 diabetes mellitus with diabetic chronic kidney disease: Secondary | ICD-10-CM | POA: Diagnosis not present

## 2022-09-03 DIAGNOSIS — C786 Secondary malignant neoplasm of retroperitoneum and peritoneum: Secondary | ICD-10-CM | POA: Diagnosis not present

## 2022-09-03 DIAGNOSIS — N390 Urinary tract infection, site not specified: Secondary | ICD-10-CM | POA: Diagnosis not present

## 2022-09-03 DIAGNOSIS — T8451XD Infection and inflammatory reaction due to internal right hip prosthesis, subsequent encounter: Secondary | ICD-10-CM | POA: Diagnosis not present

## 2022-09-03 DIAGNOSIS — Z452 Encounter for adjustment and management of vascular access device: Secondary | ICD-10-CM | POA: Diagnosis not present

## 2022-09-03 DIAGNOSIS — C50912 Malignant neoplasm of unspecified site of left female breast: Secondary | ICD-10-CM | POA: Diagnosis not present

## 2022-09-03 DIAGNOSIS — L02415 Cutaneous abscess of right lower limb: Secondary | ICD-10-CM | POA: Diagnosis not present

## 2022-09-03 DIAGNOSIS — D61818 Other pancytopenia: Secondary | ICD-10-CM | POA: Diagnosis not present

## 2022-09-03 DIAGNOSIS — C569 Malignant neoplasm of unspecified ovary: Secondary | ICD-10-CM | POA: Diagnosis not present

## 2022-09-03 DIAGNOSIS — I129 Hypertensive chronic kidney disease with stage 1 through stage 4 chronic kidney disease, or unspecified chronic kidney disease: Secondary | ICD-10-CM | POA: Diagnosis not present

## 2022-09-03 LAB — AEROBIC/ANAEROBIC CULTURE W GRAM STAIN (SURGICAL/DEEP WOUND)
Culture: NO GROWTH
Culture: NO GROWTH
Gram Stain: NONE SEEN
Gram Stain: NONE SEEN

## 2022-09-05 ENCOUNTER — Inpatient Hospital Stay: Payer: PPO

## 2022-09-05 ENCOUNTER — Inpatient Hospital Stay: Payer: PPO | Admitting: Hematology and Oncology

## 2022-09-05 LAB — AEROBIC/ANAEROBIC CULTURE W GRAM STAIN (SURGICAL/DEEP WOUND)
Culture: NO GROWTH
Gram Stain: NONE SEEN

## 2022-09-08 ENCOUNTER — Ambulatory Visit: Payer: PPO

## 2022-09-08 DIAGNOSIS — C786 Secondary malignant neoplasm of retroperitoneum and peritoneum: Secondary | ICD-10-CM | POA: Diagnosis not present

## 2022-09-09 DIAGNOSIS — T8459XA Infection and inflammatory reaction due to other internal joint prosthesis, initial encounter: Secondary | ICD-10-CM | POA: Diagnosis not present

## 2022-09-11 DIAGNOSIS — L02419 Cutaneous abscess of limb, unspecified: Secondary | ICD-10-CM | POA: Diagnosis not present

## 2022-09-12 ENCOUNTER — Ambulatory Visit: Payer: PPO | Admitting: Adult Health

## 2022-09-15 ENCOUNTER — Encounter: Payer: Self-pay | Admitting: Nurse Practitioner

## 2022-09-15 ENCOUNTER — Ambulatory Visit (INDEPENDENT_AMBULATORY_CARE_PROVIDER_SITE_OTHER): Payer: PPO | Admitting: Nurse Practitioner

## 2022-09-15 ENCOUNTER — Other Ambulatory Visit: Payer: Self-pay | Admitting: Nurse Practitioner

## 2022-09-15 VITALS — BP 122/68 | HR 78 | Temp 97.2°F | Ht 64.0 in | Wt 138.0 lb

## 2022-09-15 DIAGNOSIS — D61818 Other pancytopenia: Secondary | ICD-10-CM

## 2022-09-15 DIAGNOSIS — N1832 Chronic kidney disease, stage 3b: Secondary | ICD-10-CM

## 2022-09-15 DIAGNOSIS — I10 Essential (primary) hypertension: Secondary | ICD-10-CM

## 2022-09-15 DIAGNOSIS — E1122 Type 2 diabetes mellitus with diabetic chronic kidney disease: Secondary | ICD-10-CM

## 2022-09-15 DIAGNOSIS — Z96649 Presence of unspecified artificial hip joint: Secondary | ICD-10-CM

## 2022-09-15 DIAGNOSIS — Z8543 Personal history of malignant neoplasm of ovary: Secondary | ICD-10-CM

## 2022-09-15 DIAGNOSIS — Z09 Encounter for follow-up examination after completed treatment for conditions other than malignant neoplasm: Secondary | ICD-10-CM

## 2022-09-15 DIAGNOSIS — D649 Anemia, unspecified: Secondary | ICD-10-CM

## 2022-09-15 DIAGNOSIS — T8459XS Infection and inflammatory reaction due to other internal joint prosthesis, sequela: Secondary | ICD-10-CM | POA: Diagnosis not present

## 2022-09-15 DIAGNOSIS — E43 Unspecified severe protein-calorie malnutrition: Secondary | ICD-10-CM

## 2022-09-15 DIAGNOSIS — K744 Secondary biliary cirrhosis: Secondary | ICD-10-CM

## 2022-09-15 DIAGNOSIS — N3000 Acute cystitis without hematuria: Secondary | ICD-10-CM | POA: Diagnosis not present

## 2022-09-15 DIAGNOSIS — C786 Secondary malignant neoplasm of retroperitoneum and peritoneum: Secondary | ICD-10-CM

## 2022-09-15 NOTE — Progress Notes (Signed)
Hospital follow up  Assessment and Plan: Hospital visit follow up for:   1. Hospital discharge follow-up Reviewed all hospital discharge instructions including medications, home health. All questions and concerns reviewed and addressed. Patient is agreeable to palliative referral.   2. Acute cystitis without hematuria Push fluids. Monitor UA  3. Infection of prosthetic hip joint, sequela Continue to follow with ID Continue to follow with Orthopedics  4. History of ovarian cancer Continue to follow with Oncology  5. Peritoneal carcinomatosis ([place]) Continue to follow with Oncology  6. Secondary biliary cirrhosis ([place]) Continue to monitor Check CMP/Bilirubin   7. Hypomagnesemia Monitor levels  8. Stage 3b chronic kidney disease (CKD) ([place]) Discussed how what you eat and drink can aide in kidney protection. Stay well hydrated. Avoid high salt foods. Avoid NSAIDS. Keep BP and BG well controlled.   Take medications as prescribed. Remain active and exercise as tolerated daily. Maintain weight.  Continue to monitor. Check CMP/GFR/Microablumin   9. Essential hypertension Controlled on current regimen of metoprolol 25 mg daily.  Discussed DASH (Dietary Approaches to Stop Hypertension) DASH diet is lower in sodium than a typical American diet. Cut back on foods that are high in saturated fat, cholesterol, and trans fats. Eat more whole-grain foods, fish, poultry, and nuts Remain active and exercise as tolerated daily.  Monitor BP at home-Call if greater than 130/80.  Check CMP/CBC   10. Type 2 diabetes mellitus with stage 3b chronic kidney disease, without long-term current use of insulin [facility]) Education: Reviewed 'ABCs' of diabetes management  Discussed goals to be met and/or maintained include A1C (<7) Blood pressure (<130/80) Cholesterol (LDL <70) Continue Eye Exam yearly  Continue Dental Exam Q6 mo Discussed dietary recommendations Discussed Physical Activity  recommendations Foot exam UTD Check A1C   11. Hyperbilirubinemia Monitor levels  12. Normocytic anemia Monitor CBC/anemia panel if indicated  13. Pancytopenia, acquired ([place]) Monitor CBC   14. Protein-calorie malnutrition, severe ([place]) Continue protein supplement  Referral to Palliative care for further review and evaluation.  Continue to follow up with interdisciplinary team members as scheduled.    She will f/u with Dr. [name] after completion of abx therapy.   Follow-up with Dr. Tommy Medal, ID on 09/23/2022 at 2:45 PM.  Follow-up with Dr. Ephraim Hamburger as scheduled.  All medications were reviewed with patient and family and fully reconciled. All questions answered fully, and patient and family members were encouraged to call the office with any further questions or concerns. Discussed goal to avoid readmission related to this diagnosis.   Over 40 minutes of exam, counseling, chart review, and complex, high/moderate level critical decision making was performed this visit.   Future Appointments  Date Time Provider [place]  09/15/2022  9:00 AM Darrol Jump, NP [facility] None  09/23/2022  2:45 PM Tommy Medal, Lavell Islam, MD [facility]  02/25/2023 10:00 AM Darrol Jump, NP [facility] None  05/29/2023 11:00 AM Darrol Jump, NP [facility] None     HPI 86 y.o.female presents for follow up for transition from recent hospitalization or SNIF stay. Admit date to the hospital was 08/24/22, patient was discharged from the hospital on 09/02/22 and our clinical staff contacted the office the day after discharge to set up a follow up appointment. The discharge summary, medications, and diagnostic test results were reviewed before meeting with the patient. The patient was admitted for:   Terril Amaro Gouger returned to the emergency department due to abdominal pain that felt like a pressure across her abdomen and increased  ascites.  Positive occasional nausea and overall decreased  appetite, but no diarrhea, constipation, melena or hematochezia.  She has also been having right hip pain for the past week with significant decrease in mobility.  She has had some dysuria that has been getting better and was being treated with amoxicillin for UTI.  No fever, chills or night sweats. No sore throat, rhinorrhea, dyspnea, wheezing or hemoptysis.  No chest pain, palpitations, diaphoresis, PND, orthopnea, but occasionally gets lower extremity edema, particularly on the right side.  No polyuria, polydipsia, polyphagia or blurred vision.  Her urinalysis showed moderate hemoglobinuria, ketonuria 5 and proteinuria 30 mg/dL.  There was moderate leukocyte esterase.  21-50 RBC, more than 50 WBC with rare bacteria.  CBC showed a white count of 6.2 with 63% neutrophils, hemoglobin 9.1 g/dL and platelets 145.   CT abdomen/pelvis done 2 days ago showed signs of mild cystitis.  There was interval development of a moderate body myositis which might be related to the compensated liver disease superimposed on peritoneal carcinomatosis.  There was narrowed appearance of the suprahepatic inferior vena cava just below the heart that it is of uncertain significance.  If persistent or there is worsening without clear cause consider pressure transduction to determine physiologic significance.  There was small amount of fluid adjacent to the right hip in the gluteal musculature measuring 6.1 x 1.8 cm that could be a hematoma from fall or infection/abscess.  There was stable appearance of underlying oncologic findings of peritoneal carcinomatosis.  CT abdomen done today without any significant changes from previous 1, but no mention of gluteal muscle fluid collection.  CT head also done on 9/22 with no acute intracranial normalities.  Right hip x-ray from 2 days ago showing status post right hip arthroplasty but no evidence of hardware failure.  She was treated for acute UTI/acute cystitis without hematuria, right hip  infection, fluid aspiratied via IR with gram stain showing oderate WBC, drain placed per IR, started on IV Unasyn, cultures with rare staphylococcus caprae, she was taken to OR with s/p I&D and intraoperative cultures, IR recommended 6 weeks of targed IV abx followed by oral abx for a total of 6 months versus a year.  IV unasyn changed to IV daptomycin, then IV cefazolin.  She was set up for [facility] services and to f/u with orthopedics and ID.    She was seen by primary oncologist, Dr. [name], she was noted to be hypoalubminemic with thrid spacing d/t current infection,, chemotherapy being postponed to next week, and may be delayed further d/t right prostehetm hip infection.  Family andpatient which to contineu aggressive therpay.  She received IV albumin.  Outpatient tmt with oncology to be postponed until comletin of IV abx.    Her Hemogloboin went as low as 7.1 during hospiutaliziont, she reeived 3 units PRBV and Hgb increased to 10.6 day of d/c.  FOBT done was negative.  She is to continue to f/u with oncology/hematology.  She is trying to continue protein supplement.  She is to f/u with Dr. [name] but unable to do anything further at this time treatment wise until abx therapy complete for right hip infection.  Dr. [name] has offered palliative services in the past but patient has declined.  She is set to follow-up with Dr. Zachery Dakins in 1 week.  She is set to follow-up with Dr. Tommy Medal, ID on 09/23/2022 at 2:45 PM.  Home health is involved. They are coming out on  Mondays to change her port.  Images while in the hospital: [place] DRAIN/INJ MAJOR JOINT/BURSA  Result Date: 08/25/2022 INDICATION: 86 year old woman with history of ovarian malignancy and prior right hip replacement presents to IR for drainage of fluid collection overlying the right hip. EXAM: Ultrasound-guided drain placement in fluid collection overlying right hip. MEDICATIONS: The patient is currently admitted to the hospital and  receiving intravenous antibiotics. The antibiotics were administered within an appropriate time frame prior to the initiation of the procedure. ANESTHESIA/SEDATION: None COMPLICATIONS: None immediate. PROCEDURE: Informed written consent was obtained from the patient after a thorough discussion of the procedural risks, benefits and alternatives. All questions were addressed. Maximal Sterile Barrier Technique was utilized including caps, mask, sterile gowns, sterile gloves, sterile drape, hand hygiene and skin antiseptic. A timeout was performed prior to the initiation of the procedure. Patient positioned left lateral decubitus on the procedure table. The skin overlying the right hip was prepped and draped in usual fashion. Ultrasound evaluation of the right hip again showed the fluid collection between the gluteal musculature. Following local lidocaine administration, the right hip fluid collection was accessed with 19 gauge Yueh needle utilizing continuous ultrasound guidance. Yellow cloudy fluid was aspirated and sent for Gram stain and culture, as well as, cytology. The Yueh catheter was exchanged for 10.2 [place] multipurpose pigtail catheter over 0.035 inch guidewire. Drain was secured to skin with suture and connected to bulb suction. IMPRESSION: Ultrasound-guided drain placement in fluid collection overlying the right hip. 20 mL of cloudy yellow fluid was aspirated and sent for Gram stain, culture, and cytology. Electronically Signed   By: Miachel Roux M.D.   On: 08/25/2022 13:34   Inpatient: VAS [place] PAD ABI  Result Date: 08/25/2022  LOWER EXTREMITY DOPPLER STUDY Patient Name:  Kayla Baker  Date of Exam:   08/25/2022 Medical Rec #: [id number]    Accession #:    [id number] Date of Birth: [birth year]    Patient Gender: F Patient Age:   62 years Exam Location[facility]  [facility] Procedure:      VAS [place] ABI WITH/WO TBI Referring Phys: Merlene Pulling  --------------------------------------------------------------------------------  Indications: RLE pain and redness (PAD ABI) High Risk Factors: Hypertension, hyperlipidemia, Diabetes, no history of                    smoking. Other Factors: CKD.  Comparison Study: No previous exams Performing Technologist: Hill, Jody RVT, RDMS  Examination Guidelines: A complete evaluation includes at minimum, Doppler waveform signals and systolic blood pressure reading at the level of bilateral brachial, anterior tibial, and posterior tibial arteries, when vessel segments are accessible. Bilateral testing is considered an integral part of a complete examination. Photoelectric Plethysmograph (PPG) waveforms and toe systolic pressure readings are included as required and additional duplex testing as needed. Limited examinations for reoccurring indications may be performed as noted.  ABI Findings: +---------+------------------+-----+---------+--------+ Right    Rt Pressure (mmHg)IndexWaveform Comment  +---------+------------------+-----+---------+--------+ Brachial 145                    triphasic         +---------+------------------+-----+---------+--------+ PTA      174               1.20 biphasic          +---------+------------------+-----+---------+--------+ DP       151               1.04 biphasic          +---------+------------------+-----+---------+--------+ Lucia Gaskins  0.99 Normal            +---------+------------------+-----+---------+--------+ +---------+------------------+-----+---------+---------------------------+ Left     Lt Pressure (mmHg)IndexWaveform Comment                     +---------+------------------+-----+---------+---------------------------+ Brachial                        triphasicHX of breast cancer - no BP +---------+------------------+-----+---------+---------------------------+ PTA      184               1.27 biphasic                              +---------+------------------+-----+---------+---------------------------+ DP       162               1.12 biphasic                             +---------+------------------+-----+---------+---------------------------+ Great Toe138               0.95 Normal                               +---------+------------------+-----+---------+---------------------------+ +-------+-----------+-----------+------------+------------+ ABI/TBIToday's ABIToday's TBIPrevious ABIPrevious TBI +-------+-----------+-----------+------------+------------+ Right  1.20       0.99                                +-------+-----------+-----------+------------+------------+ Left   1.27       0.95                                +-------+-----------+-----------+------------+------------+   Summary: Right: Resting right ankle-brachial index is within normal range. Left: Resting left ankle-brachial index is within normal range. *See table(s) above for measurements and observations.  Electronically signed by Harold Barban MD on 08/25/2022 at 11:32:59 AM.    Final    CT ABDOMEN PELVIS W CONTRAST  Addendum Date: 08/24/2022   ADDENDUM REPORT: 08/24/2022 15:12 ADDENDUM: In addition, a persistent fluid collection with thin enhancing rim is seen in the right gluteal muscles, superficial to the greater trochanter of the right hip. This measures 6.2 x 2.2 cm and shows no significant change compared to recent study 2 days ago. Electronically Signed   By: Marlaine Hind M.D.   On: 08/24/2022 15:12   Result Date: 08/24/2022 CLINICAL DATA:  Worsening diffuse abdominal pain. Ovarian carcinoma. * Tracking Code: BO * EXAM: CT ABDOMEN AND PELVIS WITH CONTRAST TECHNIQUE: Multidetector CT imaging of the abdomen and pelvis was performed using the standard protocol following bolus administration of intravenous contrast. RADIATION DOSE REDUCTION: This exam was performed according to the departmental dose-optimization program which  includes automated exposure control, adjustment of the mA and/or kV according to patient size and/or use of iterative reconstruction technique. CONTRAST:  148m OMNIPAQUE IOHEXOL 300 MG/ML  SOLN COMPARISON:  08/22/2022 FINDINGS: Lower Chest: No acute findings. Stable tiny bilateral pleural effusions. Hepatobiliary: Hepatic cirrhosis again demonstrated. No hepatic masses identified. Recanalization of paraumbilical veins again seen, consistent with portal venous hypertension. Gallbladder is unremarkable. No evidence of biliary ductal dilatation. Pancreas:  No mass or inflammatory changes. Spleen: Stable splenomegaly, consistent with portal venous hypertension. Adrenals/Urinary Tract: No suspicious masses identified.  No evidence of ureteral calculi or hydronephrosis. Stomach/Bowel: Stable mild diffuse gastric wall thickening, most likely due to portal gastropathy in setting of cirrhosis. No evidence of obstruction, acute inflammatory process, or abscess. Diverticulosis is seen mainly involving the sigmoid colon, however there is no evidence of diverticulitis. Vascular/Lymphatic: Stable sub-cm retroperitoneal lymph nodes in left para-aortic region. No pathologically enlarged lymph nodes. No acute vascular findings. Aortic atherosclerotic calcification incidentally noted. Reproductive: Prior hysterectomy. Peritoneal thickening and nodularity is again seen in the pelvis. Focal site in right adnexal region measures 4.2 x 2.5 cm, without significant change since prior study. Soft tissue stranding throughout the omental fat remains stable, with a few sub-centimeter peritoneal soft tissue densities in the left anterior abdomen. Moderate ascites, without significant change. Other:  None. Musculoskeletal:  No suspicious bone lesions identified. 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 Hepatic cirrhosis and findings of portal venous hypertension. No evidence of hepatic  neoplasm. Colonic diverticulosis, without radiographic evidence of diverticulitis. Aortic Atherosclerosis (ICD10-I70.0). Electronically Signed: By: Marlaine Hind M.D. On: 08/24/2022 13:07     Current Outpatient Medications (Endocrine & Metabolic):    dexamethasone (DECADRON) 4 MG tablet, Take 2 tablets the morning before chemotherapy and then 2 tablets daily for 2 days after chemo   glimepiride (AMARYL) 1 MG tablet, Take 1 tab by mouth in the morning if fasting is running 150+. Please do not take this medication if you are ill or not eating as this can cause a low blood sugar. (Patient taking differently: Take 1 mg by mouth See admin instructions. Take 1 tab by mouth daily as needed if fasting is running 150+. Please do not take this medication if you are ill or not eating as this can cause a low blood sugar.)  Current Outpatient Medications (Cardiovascular):    ezetimibe (ZETIA) 10 MG tablet, TAKE 1 TABLET BY MOUTH DAILY FOR CHOLESTEROL (Patient taking differently: Take 10 mg by mouth daily.)   metoprolol succinate (TOPROL-XL) 25 MG 24 hr tablet, Take  1 tablet  Daily  for BP       /TAKE 1 TABLET BY MOUTH DAILY FOR BLOOD PRESSURE (Patient taking differently: Take 25 mg by mouth daily.)   rosuvastatin (CRESTOR) 5 MG tablet, TAKE 1 TABLET BY MOUTH IN THE EVENING THREE TIMES WEEKLY FOR CHOLESTEROL (Patient taking differently: Take 5 mg by mouth See admin instructions. Monday, Wednesday and Friday)   Current Outpatient Medications (Analgesics):    acetaminophen (TYLENOL) 500 MG tablet, Take 500 mg by mouth every 6 (six) hours as needed for mild pain, headache or fever.   allopurinol (ZYLOPRIM) 100 MG tablet, Take 1 tablet (100 mg total) by mouth daily. FOR GOUT PREVENTION, (Patient taking differently: Take 100 mg by mouth daily.)   aspirin EC 81 MG tablet, Take 1 tablet (81 mg total) by mouth 2 (two) times daily. Swallow whole.   oxyCODONE (OXY IR/ROXICODONE) 5 MG immediate release tablet, Take 0.5-1  tablets (2.5-5 mg total) by mouth every 4 (four) hours as needed for severe pain or moderate pain.  Current Outpatient Medications (Hematological):    cyanocobalamin 1000 MCG tablet, Take 1,000 mcg by mouth daily.  Current Outpatient Medications (Other):    ceFAZolin (ANCEF) IVPB, Inject 2 g into the vein every 12 (twelve) hours. Indication:  Prosthetic right hip joint infection  First Dose: Yes Last Day of Therapy:  10/10/22 Labs - Once weekly:  CBC/D and BMP, Labs - Every other week:  ESR and CRP Method of  administration: IV Push Method of administration may be changed at the discretion of home infusion pharmacist based upon assessment of the patient and/or caregiver's ability to self-administer the medication ordered.   Cholecalciferol (VITAMIN D3) 5000 units CAPS, Take 5,000 Units by mouth daily.   Ensure Max Protein (ENSURE MAX PROTEIN) LIQD, Take 330 mLs (11 oz total) by mouth 2 (two) times daily.   gabapentin (NEURONTIN) 300 MG capsule, Take 1 capsule 4 x  /day for Pain (Patient taking differently: Take 300 mg by mouth 4 (four) times daily.)   lidocaine-prilocaine (EMLA) cream, Apply 1 application. topically daily as needed (for port access).   magnesium oxide (MAG-OX) 400 (240 Mg) MG tablet, Take 1 tablet (400 mg total) by mouth daily.   ondansetron (ZOFRAN) 8 MG tablet, Take 1 tablet (8 mg total) by mouth every 8 (eight) hours as needed for refractory nausea / vomiting.   pantoprazole (PROTONIX) 40 MG tablet, Take 1 tablet (40 mg total) by mouth daily.   polyethylene glycol (MIRALAX) 17 g packet, Take 17 g by mouth 2 (two) times daily. 17 grams in 6 oz of favorite drink twice a day until bowel movement.  LAXITIVE.  Restart if two days since last bowel movement (Patient taking differently: Take 17 g by mouth 2 (two) times daily.)   trolamine salicylate (ASPERCREME) 10 % cream, Apply 1 application topically as needed for muscle pain (Use if you still feel tingles in feet in the morning after  taking Gabapentin).   vitamin C (ASCORBIC ACID) 500 MG tablet, Take 500 mg by mouth every evening.   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[place]) 02/29/2016   skin- 2016- squamous- on right upper arm   Chronic kidney disease    Closed displaced fracture of right femoral neck (HCC) 11/07/2021   Deficiency anemia 11/02/2020   Diabetes mellitus without complication (HCC)    GERD (gastroesophageal reflux disease)    Gout    takes Allopurinol daily   History of chemotherapy    History of colon polyps    benign   History of shingles    Hx of radiation therapy    Hyperlipidemia    Hypertension    Ovarian ca ([place]) dx'd 03/2020   Personal history of chemotherapy    2017   Personal history of radiation therapy    2017   Vitamin D deficiency      Allergies  Allergen Reactions   Mobic [Meloxicam] Swelling   Prednisone Swelling and Other (See Comments)    Can Not take High doses   Premarin [Estrogens Conjugated] Hives   Trovan [Alatrofloxacin] Other (See Comments)    Unknown reaction    ROS: all negative except above.   Physical Exam: There were no vitals filed for this visit. There were no vitals taken for this visit. General Appearance: Well nourished, in no apparent distress. Eyes: PERRLA, EOMs, conjunctiva no swelling or erythema Sinuses: No Frontal/maxillary tenderness ENT/Mouth: Ext aud canals clear, TMs without erythema, bulging. No erythema, swelling, or exudate on post pharynx.  Tonsils not swollen or erythematous. Hearing normal.  Neck: Supple, thyroid normal.  Respiratory: Respiratory effort normal, BS equal bilaterally without rales, rhonchi, wheezing or stridor.  Cardio: RRR with no MRGs. Brisk peripheral pulses without edema.  Abdomen: Soft, + BS.  Non tender, no guarding, rebound, hernias,  masses. Lymphatics: Non tender  without lymphadenopathy.  Musculoskeletal: Full ROM, 5/5 strength, normal gait.  Skin: Warm, dry without rashes, lesions, ecchymosis.  Neuro: Cranial nerves intact. Normal muscle tone, no cerebellar symptoms. Sensation intact.  Psych: Awake and oriented X 3, normal affect, Insight and Judgment appropriate.     Darrol Jump, NP 8:27 AM [facility]

## 2022-09-16 ENCOUNTER — Telehealth: Payer: Self-pay

## 2022-09-16 ENCOUNTER — Encounter: Payer: Self-pay | Admitting: Hematology and Oncology

## 2022-09-16 DIAGNOSIS — T8459XD Infection and inflammatory reaction due to other internal joint prosthesis, subsequent encounter: Secondary | ICD-10-CM | POA: Diagnosis not present

## 2022-09-16 NOTE — Telephone Encounter (Signed)
Spoke with patient and scheduled a telephonic Palliative Consult for 09/22/22 @ 2:30 PM.  Consent obtained; updated Netsmart, Team List and Epic.

## 2022-09-17 ENCOUNTER — Encounter: Payer: Self-pay | Admitting: Hematology and Oncology

## 2022-09-18 ENCOUNTER — Ambulatory Visit: Payer: PPO

## 2022-09-18 ENCOUNTER — Encounter: Payer: Self-pay | Admitting: Hematology and Oncology

## 2022-09-18 ENCOUNTER — Other Ambulatory Visit: Payer: PPO

## 2022-09-18 ENCOUNTER — Ambulatory Visit: Payer: PPO | Admitting: Hematology and Oncology

## 2022-09-18 DIAGNOSIS — L02419 Cutaneous abscess of limb, unspecified: Secondary | ICD-10-CM | POA: Diagnosis not present

## 2022-09-18 LAB — COMPLETE METABOLIC PANEL WITH GFR
AG Ratio: 0.7 (calc) — ABNORMAL LOW (ref 1.0–2.5)
ALT: 3 U/L — ABNORMAL LOW (ref 6–29)
AST: 48 U/L — ABNORMAL HIGH (ref 10–35)
Albumin: 2.9 g/dL — ABNORMAL LOW (ref 3.6–5.1)
Alkaline phosphatase (APISO): 136 U/L (ref 37–153)
BUN/Creatinine Ratio: 18 (calc) (ref 6–22)
BUN: 20 mg/dL (ref 7–25)
CO2: 27 mmol/L (ref 20–32)
Calcium: 9.6 mg/dL (ref 8.6–10.4)
Chloride: 109 mmol/L (ref 98–110)
Creat: 1.09 mg/dL — ABNORMAL HIGH (ref 0.60–0.95)
Globulin: 4.2 g/dL (calc) — ABNORMAL HIGH (ref 1.9–3.7)
Glucose, Bld: 100 mg/dL — ABNORMAL HIGH (ref 65–99)
Potassium: 4.4 mmol/L (ref 3.5–5.3)
Sodium: 145 mmol/L (ref 135–146)
Total Bilirubin: 0.7 mg/dL (ref 0.2–1.2)
Total Protein: 7.1 g/dL (ref 6.1–8.1)
eGFR: 50 mL/min/{1.73_m2} — ABNORMAL LOW (ref >=60)

## 2022-09-18 LAB — CBC WITH DIFFERENTIAL/PLATELET
Absolute Monocytes: 423 cells/uL (ref 200–950)
Basophils Absolute: 28 cells/uL (ref 0–200)
Basophils Relative: 0.6 %
Eosinophils Absolute: 193 cells/uL (ref 15–500)
Eosinophils Relative: 4.1 %
HCT: 35.8 % (ref 35.0–45.0)
Hemoglobin: 11.4 g/dL — ABNORMAL LOW (ref 11.7–15.5)
Lymphs Abs: 1810 cells/uL (ref 850–3900)
MCH: 30.8 pg (ref 27.0–33.0)
MCHC: 31.8 g/dL — ABNORMAL LOW (ref 32.0–36.0)
MCV: 96.8 fL (ref 80.0–100.0)
MPV: 12 fL (ref 7.5–12.5)
Monocytes Relative: 9 %
Neutro Abs: 2247 cells/uL (ref 1500–7800)
Neutrophils Relative %: 47.8 %
Platelets: 113 10*3/uL — ABNORMAL LOW (ref 140–400)
RBC: 3.7 10*6/uL — ABNORMAL LOW (ref 3.80–5.10)
RDW: 15.8 % — ABNORMAL HIGH (ref 11.0–15.0)
Total Lymphocyte: 38.5 %
WBC: 4.7 10*3/uL (ref 3.8–10.8)

## 2022-09-18 LAB — URINALYSIS, ROUTINE W REFLEX MICROSCOPIC
Bacteria, UA: NONE SEEN /HPF
Bilirubin Urine: NEGATIVE
Glucose, UA: NEGATIVE
Hgb urine dipstick: NEGATIVE
Hyaline Cast: NONE SEEN /LPF
Nitrite: NEGATIVE
Specific Gravity, Urine: 1.028 (ref 1.001–1.035)
pH: 5.5 (ref 5.0–8.0)

## 2022-09-18 LAB — C-REACTIVE PROTEIN: CRP: 59.2 mg/L — ABNORMAL HIGH (ref <8.0)

## 2022-09-18 LAB — SEDIMENTATION RATE: Sed Rate: 43 mm/h — ABNORMAL HIGH (ref 0–30)

## 2022-09-18 LAB — URINE CULTURE
MICRO NUMBER:: 14056255
SPECIMEN QUALITY:: ADEQUATE

## 2022-09-18 LAB — TEST AUTHORIZATION

## 2022-09-18 LAB — MAGNESIUM: Magnesium: 1.3 mg/dL — ABNORMAL LOW (ref 1.5–2.5)

## 2022-09-19 LAB — HM DIABETES EYE EXAM

## 2022-09-20 ENCOUNTER — Ambulatory Visit: Payer: PPO

## 2022-09-21 ENCOUNTER — Other Ambulatory Visit: Payer: Self-pay | Admitting: Internal Medicine

## 2022-09-21 DIAGNOSIS — I10 Essential (primary) hypertension: Secondary | ICD-10-CM

## 2022-09-21 MED ORDER — METOPROLOL SUCCINATE ER 25 MG PO TB24: ORAL_TABLET | ORAL | 3 refills | Status: DC

## 2022-09-22 ENCOUNTER — Encounter: Payer: Self-pay | Admitting: Hematology and Oncology

## 2022-09-22 ENCOUNTER — Ambulatory Visit: Payer: PPO | Admitting: Family Medicine

## 2022-09-22 DIAGNOSIS — C786 Secondary malignant neoplasm of retroperitoneum and peritoneum: Secondary | ICD-10-CM | POA: Diagnosis not present

## 2022-09-22 DIAGNOSIS — Z515 Encounter for palliative care: Secondary | ICD-10-CM

## 2022-09-22 DIAGNOSIS — E876 Hypokalemia: Secondary | ICD-10-CM | POA: Diagnosis not present

## 2022-09-22 NOTE — Progress Notes (Signed)
[facility] Consult Note Telephone: [phone]  Fax: [phone]   Date of encounter: 09/22/22 2:39 PM PATIENT NAME: Kayla Baker [address] [place] [address]   [phone] (home)  DOB: 15-Mar-1936 MRN: [id number] PRIMARY CARE PROVIDER:    [name], MD,  [address] [place] [address] [phone]  REFERRING PROVIDER:   [name], [address] [place],  [place] [address] [phone]  RESPONSIBLE PARTY:    Contact Information     Name Relation Home Work Mobile   Mabie,[name] [phone]  [phone]     I connected with  Kayla Baker and her spouse [name] on 09/22/2022 by a  telephonic application due to pt being unable to do video or my chart and verified that I am speaking with the correct person using two identifiers.   I discussed the limitations of evaluation and management by telemedicine. The patient expressed understanding and agreed to proceed.   Palliative Care was asked to follow this patient by consultation request of  [name], MD to address advance care planning and complex medical decision making. This is the initial visit.          ASSESSMENT, SYMPTOM MANAGEMENT AND PLAN / RECOMMENDATIONS:   Hypomagnesemia/Hypokalemia Encouraged pt to continue Mag Ox 400 mg BID to help improve Magnesium to 2.0 to help improve Hypokalemia  2.    Palliative Care Encounter Explained Palliative Care Services Chemotherapy currently on hold while treating hip abscess. Follow up in 2 weeks to re-assess status and options for treatment/potential for eligibility for Hospice.  Follow up Palliative Care Visit: Palliative care will continue to follow for complex medical decision making, advance care planning, and clarification of goals. Return 2-3 weeks or prn.    This visit was coded based on time. Total phone time 33 min 24 sec  PPS:  50%  HOSPICE ELIGIBILITY/DIAGNOSIS: TBD  Chief Complaint:  [facility] received a referral to follow up with patient for chronic disease management in setting of ovarian cancer with recent removal of hip hardware and antibiotics to treat infected prosthesis.  Palliative Care is also following to assist with advance directive planning and defining/refining goals of care.  HISTORY OF PRESENT ILLNESS:  Kayla Baker is a 86 y.o. year old female with ovarian cancer, peritoneal carcinomatosis and hx of left breast cancer.  She also has HTN, aortic atherosclerosis, RLL nodule, recent acute pancreatitis, CKD stage 3 due to type 2 DM, HLD, mild non-proliferative retinopathy of both eyes, chemotherapy induced peripheral neuropathy, osteopenia, adhesive capsulitis right shoulder, recent prosthetic hip infection currently being treated, AKI , acute UTI, pancytopenia, Vitamin D deficiency, gout, hypomagnesemia, generalized weakness, hx of therapeutic radiation, hyperbilirubinemia, hypokalemia, and severe protein calorie malnutrition. When asked her understanding of why she was referred to Palliative Care she replied, the PA and Oncologist had recommended Palliative Care to "help improve my life from now to the end". She has had recurrence of ovarian cancer to omentum, carcinomatosis with ascites.  Left partial hip replacement in 10/2021.  Bacterial infection in the hip and replaced the ball/rinsed out with antibiotics.  She is seeing the ID doctor tomorrow.  [facility] Nurse coming out weekly to change the access for her port, draw labs prn and flush it. Using walker. She denies any dental work prior to health x 6 weeks.  Chemo is on hold until hip is healed.  Denies nausea/vomiting, recent falls, CP, SOB,  dysuria, intermittent issues with constipation and she treats with Miralax and senna. Only has pain when she turns the wrong way, 4/10 scale. Not taking pain medicine.  Dr [name] is Oncologist.   Appetite is improving.  Mood comes and goes.  History obtained from review of EMR, discussion with spouse and/or Kayla Baker.     Latest Ref Rng & Units 09/15/2022   12:00 AM 09/02/2022    5:00 AM 09/01/2022    6:12 AM  CBC  WBC 3.8 - 10.8 Thousand/uL 4.7  5.4  6.1   Hemoglobin 11.7 - 15.5 g/dL 11.4  10.6  7.1   Hematocrit 35.0 - 45.0 % 35.8  31.8  23.0   Platelets 140 - 400 Thousand/uL 113  82  84        Latest Ref Rng & Units 09/15/2022   12:00 AM 09/02/2022    5:00 AM 09/01/2022    6:12 AM  CMP  Glucose 65 - 99 mg/dL 100  106  101   BUN 7 - 25 mg/dL 20  35  37   Creatinine 0.60 - 0.95 mg/dL 1.09  1.52  1.53   Sodium 135 - 146 mmol/L 145  137  139   Potassium 3.5 - 5.3 mmol/L 4.4  3.6  3.8   Chloride 98 - 110 mmol/L 109  105  111   CO2 20 - 32 mmol/L 27  25  24    Calcium 8.6 - 10.4 mg/dL 9.6  8.5  8.6   Total Protein 6.1 - 8.1 g/dL 7.1     Total Bilirubin 0.2 - 1.2 mg/dL 0.7     AST 10 - 35 U/L 48     ALT 6 - 29 U/L 3                                             Collected: 09/15/22 0000  Result status: Final  Resulting lab: [facility] [place]  Reference range: 1.5 - 2.5 mg/dL  Value: 1.3 Low   09/15/22  Urine dipstick shows positive for WBC's, positive for protein, positive for leukocytes, and positive for ketones.  Micro exam:   1+ protein, trace ketones, no bacteria, 6-10 WBCs per hpf 09/15/22 Urine cx positive for 10-49,000 CFU/ml Pseudomonas aeruginosa pan-sensitive to Cephalopsorins, quinolones, Zosyn, Gentamicin and Imipenem.  I reviewed EMR for available labs, medications, imaging, studies and related documents. Records reviewed and summarized above.   ROS General: NAD ENMT: denies dysphagia Cardiovascular: denies chest pain, denies DOE Pulmonary: denies cough, denies increased SOB Abdomen: endorses poor appetite that is improving, endorses intermittent constipation, endorses continence of bowel GU: denies dysuria, endorses continence of urine MSK:   endorses some weakness/pain in her hip, no falls reported Skin: denies rashes, abscess of right hip drained and receiving wound care Neurological: denies insomnia Psych: Endorses some mood lability but overall coping well Heme/lymph/immuno: denies bruises, abnormal bleeding  Physical Exam: limited to audible only Current and past weights: 138 lbs earlier today, 142 lbs 4 oz on 06/30/22 CV: able to speak in complete sentences without having to stop to breathe Pulmonary: No audible cough, congestion or wheezing Neuro:  no cognitive impairment Psych: A and O x 3   CURRENT PROBLEM LIST:  Patient Active Problem List   Diagnosis Date Noted   Allergy to cephalosporin    Malnutrition of moderate degree 08/28/2022   Abscess of  hip 08/28/2022   Prosthetic hip infection ([place]) 08/28/2022   Peritoneal carcinomatosis ([place])    Urinary tract infection without hematuria    Pancytopenia (HCC)    Acute UTI (urinary tract infection) 08/24/2022   Hypokalemia 08/24/2022   Hyperbilirubinemia 08/24/2022   Normocytic anemia 08/24/2022   Thrombocytopenia ([place]) 08/24/2022   Protein-calorie malnutrition, severe ([place]) 08/24/2022   Right hip pain 08/24/2022   Acute pancreatitis 08/09/2022   Abdominal pain 08/05/2022   Stage 3b chronic kidney disease (CKD) ([place]) 08/05/2022   Neutropenia ([place]) 08/05/2022   AKI (acute kidney injury) ([place]) 08/05/2022   Generalized weakness    Facial rash 07/25/2022   Diarrhea 05/12/2022   Hypomagnesemia 02/22/2022   Poor dentition 07/26/2021   Nodule of lower lobe of right lung 06/27/2021   Aortic atherosclerosis ([place]) by Abd CT scan on 05/10/2021 06/10/2021   Mild nonproliferative diabetic retinopathy of both eyes without macular edema associated with type 2 diabetes mellitus ([place]) 02/20/2021   Osteopenia 02/20/2021   Genetic testing 09/14/2020   Pancytopenia, acquired ([place]) 05/15/2020   Cancer associated pain 05/15/2020   Anemia in neoplastic disease 05/03/2020    Goals of care, counseling/discussion 04/20/2020   Ovarian cancer ([place]) 04/19/2020   Edema of right lower leg 04/05/2020   CKD stage 3 due to type 2 diabetes mellitus ([place]) 07/05/2018   Adhesive capsulitis of right shoulder 04/27/2018   Postoperative breast asymmetry 04/01/2017   History of therapeutic radiation 04/01/2017   Port catheter in place 09/18/2016   Chemotherapy-induced peripheral neuropathy ([place]) 08/07/2016   History of left breast cancer 02/29/2016   Gout 12/08/2014   Medication management 08/27/2014   Essential hypertension 11/10/2013   Hyperlipidemia associated with type 2 diabetes mellitus ([place]) 11/10/2013   DM2 (diabetes mellitus, type 2) ([place]) 11/10/2013   Vitamin D deficiency 11/10/2013   PAST MEDICAL HISTORY:  Active Ambulatory Problems    Diagnosis Date Noted   Essential hypertension 11/10/2013   Hyperlipidemia associated with type 2 diabetes mellitus ([place]) 11/10/2013   DM2 (diabetes mellitus, type 2) ([place]) 11/10/2013   Vitamin D deficiency 11/10/2013   Medication management 08/27/2014   Gout 12/08/2014   History of left breast cancer 02/29/2016   Chemotherapy-induced peripheral neuropathy ([place]) 08/07/2016   Port catheter in place 09/18/2016   CKD stage 3 due to type 2 diabetes mellitus ([place]) 07/05/2018   Edema of right lower leg 04/05/2020   Ovarian cancer ([place]) 04/19/2020   Goals of care, counseling/discussion 04/20/2020   Anemia in neoplastic disease 05/03/2020   Pancytopenia, acquired ([place]) 05/15/2020   Cancer associated pain 05/15/2020   Genetic testing 09/14/2020   Mild nonproliferative diabetic retinopathy of both eyes without macular edema associated with type 2 diabetes mellitus ([place]) 02/20/2021   Osteopenia 02/20/2021   Aortic atherosclerosis ([place]) by Abd CT scan on 05/10/2021 06/10/2021   Nodule of lower lobe of right lung 06/27/2021   Poor dentition 07/26/2021   Adhesive capsulitis of right shoulder 04/27/2018   Postoperative breast asymmetry  04/01/2017   Hypomagnesemia 02/22/2022   Diarrhea 05/12/2022   Facial rash 07/25/2022   Abdominal pain 08/05/2022   Stage 3b chronic kidney disease (CKD) ([place]) 08/05/2022   Neutropenia ([place]) 08/05/2022   AKI (acute kidney injury) ([place]) 08/05/2022   Generalized weakness    Acute pancreatitis 08/09/2022   Acute UTI (urinary tract infection) 08/24/2022   History of therapeutic radiation 04/01/2017   Hypokalemia 08/24/2022   Hyperbilirubinemia 08/24/2022   Normocytic anemia 08/24/2022   Thrombocytopenia ([place]) 08/24/2022   Protein-calorie malnutrition,  severe ([place]) 08/24/2022   Right hip pain 08/24/2022   Peritoneal carcinomatosis ([place])    Urinary tract infection without hematuria    Pancytopenia (HCC)    Malnutrition of moderate degree 08/28/2022   Abscess of hip 08/28/2022   Prosthetic hip infection ([place]) 08/28/2022   Allergy to cephalosporin    Resolved Ambulatory Problems    Diagnosis Date Noted   Diabetic neuropathy, painful ([place]) 08/28/2014   Incontinence of urine in female 04/04/2020   Malignant ascites 05/07/2020   Cellulitis of foot, left 05/31/2020   Protein-calorie malnutrition, moderate ([place]) 06/05/2020   Deficiency anemia 11/02/2020   Atypical chest pain 12/12/2020   Elevated liver enzymes 03/12/2021   Closed displaced fracture of right femoral neck ([place]) 11/07/2021   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[place]) 02/29/2016   Chronic kidney disease    Diabetes mellitus without complication (HCC)    GERD (gastroesophageal reflux disease)    History of chemotherapy    History of colon polyps    History of shingles    Hx of radiation therapy    Hyperlipidemia    Hypertension    Ovarian ca ([place]) dx'd 03/2020   Personal history of chemotherapy    Personal history of radiation therapy    SOCIAL HX:  Social History    Tobacco Use   Smoking status: Never   Smokeless tobacco: Never  Substance Use Topics   Alcohol use: No   FAMILY HX:  Family History  Problem Relation Age of Onset   Stroke Mother 3   Other Mother        history of hysterectomy after last childbirth   Diabetes Father    Alzheimer's disease Father    Hypertension Sister    Lung cancer Sister 29       smoker   Other Sister        history of hysterectomy for fibroids   Breast cancer Sister 28   Cirrhosis Sister    Stroke Maternal Grandmother    Heart Problems Maternal Grandmother    Esophageal cancer Maternal Aunt 74       not a smoker   Parkinson's disease Maternal Aunt    Heart attack Maternal Uncle 65   Pancreatic cancer Cousin 94       paternal 1st cousin   Lung cancer Other        nephew dx. 53s; +smoker   Epilepsy Other    Other Other 12       great niece dx. benign ganglioglioma brain tumor; treated at [facility]   Epilepsy Other        no seizures in 2 years   Colon cancer Neg Hx    Stomach cancer Neg Hx    Colon polyps Neg Hx    Ulcerative colitis Neg Hx        Preferred Pharmacy: ALLERGIES:  Allergies  Allergen Reactions   Mobic [Meloxicam] Swelling   Prednisone Swelling and Other (See Comments)    Can Not take High doses   Premarin [Estrogens Conjugated] Hives   Trovan [Alatrofloxacin] Other (See Comments)    Unknown reaction     PERTINENT MEDICATIONS:  Outpatient Encounter Medications as of 09/22/2022  Medication Sig   acetaminophen (TYLENOL) 500 MG tablet Take 500 mg by mouth every 6 (six) hours as needed for mild  pain, headache or fever.   allopurinol (ZYLOPRIM) 100 MG tablet Take 1 tablet (100 mg total) by mouth daily. FOR GOUT PREVENTION, (Patient taking differently: Take 100 mg by mouth daily.)   aspirin EC 81 MG tablet Take 1 tablet (81 mg total) by mouth 2 (two) times daily. Swallow whole.   ceFAZolin (ANCEF) IVPB Inject 2 g into the vein every 12 (twelve) hours. Indication:  Prosthetic right  hip joint infection  First Dose: Yes Last Day of Therapy:  10/10/22 Labs - Once weekly:  CBC/D and BMP, Labs - Every other week:  ESR and CRP Method of administration: IV Push Method of administration may be changed at the discretion of home infusion pharmacist based upon assessment of the patient and/or caregiver's ability to self-administer the medication ordered.   Cholecalciferol (VITAMIN D3) 5000 units CAPS Take 5,000 Units by mouth daily.   cyanocobalamin 1000 MCG tablet Take 1,000 mcg by mouth daily.   dexamethasone (DECADRON) 4 MG tablet Take 2 tablets the morning before chemotherapy and then 2 tablets daily for 2 days after chemo   Ensure Max Protein (ENSURE MAX PROTEIN) LIQD Take 330 mLs (11 oz total) by mouth 2 (two) times daily.   ezetimibe (ZETIA) 10 MG tablet TAKE 1 TABLET BY MOUTH DAILY FOR CHOLESTEROL (Patient taking differently: Take 10 mg by mouth daily.)   gabapentin (NEURONTIN) 300 MG capsule Take 1 capsule 4 x  /day for Pain (Patient taking differently: Take 300 mg by mouth 4 (four) times daily.)   glimepiride (AMARYL) 1 MG tablet Take 1 tab by mouth in the morning if fasting is running 150+. Please do not take this medication if you are ill or not eating as this can cause a low blood sugar. (Patient taking differently: Take 1 mg by mouth See admin instructions. Take 1 tab by mouth daily as needed if fasting is running 150+. Please do not take this medication if you are ill or not eating as this can cause a low blood sugar.)   lidocaine-prilocaine (EMLA) cream Apply 1 application. topically daily as needed (for port access).   magnesium oxide (MAG-OX) 400 (240 Mg) MG tablet Take 1 tablet (400 mg total) by mouth daily.   metoprolol succinate (TOPROL-XL) 25 MG 24 hr tablet Take  1 tablet  Daily  for BP                                                          /                                       TAKE                            BY                                 MOUTH    ondansetron (ZOFRAN) 8 MG tablet Take 1 tablet (8 mg total) by mouth every 8 (eight) hours as needed for refractory nausea / vomiting.   oxyCODONE (OXY IR/ROXICODONE) 5 MG immediate release tablet Take 0.5-1 tablets (2.5-5 mg total) by mouth  every 4 (four) hours as needed for severe pain or moderate pain.   pantoprazole (PROTONIX) 40 MG tablet Take 1 tablet (40 mg total) by mouth daily.   polyethylene glycol (MIRALAX) 17 g packet Take 17 g by mouth 2 (two) times daily. 17 grams in 6 oz of favorite drink twice a day until bowel movement.  LAXITIVE.  Restart if two days since last bowel movement (Patient taking differently: Take 17 g by mouth 2 (two) times daily.)   rosuvastatin (CRESTOR) 5 MG tablet TAKE 1 TABLET BY MOUTH IN THE EVENING THREE TIMES WEEKLY FOR CHOLESTEROL (Patient taking differently: Take 5 mg by mouth See admin instructions. Monday, Wednesday and Friday)   trolamine salicylate (ASPERCREME) 10 % cream Apply 1 application topically as needed for muscle pain (Use if you still feel tingles in feet in the morning after taking Gabapentin).   vitamin C (ASCORBIC ACID) 500 MG tablet Take 500 mg by mouth every evening.    No facility-administered encounter medications on file as of 09/22/2022.     ----------------------------------------------------------------------- Advance Care Planning/Goals of Care: Goals include to maximize quality of life and symptom management.  CODE STATUS: Currently remains full code    Thank you for the opportunity to participate in the care of Ms. Finks.  The palliative care team will continue to follow. Please call our office at [phone] if we can be of additional assistance.   Marijo Conception, FNP-C  COVID-19 PATIENT SCREENING TOOL Asked and negative response unless otherwise noted:  Have you had symptoms of covid, tested positive or been in contact with someone with symptoms/positive test in the past 5-10 days? No

## 2022-09-23 ENCOUNTER — Ambulatory Visit: Payer: PPO | Admitting: Infectious Disease

## 2022-09-25 DIAGNOSIS — L02419 Cutaneous abscess of limb, unspecified: Secondary | ICD-10-CM | POA: Diagnosis not present

## 2022-09-26 ENCOUNTER — Ambulatory Visit: Payer: PPO

## 2022-09-26 ENCOUNTER — Other Ambulatory Visit: Payer: PPO

## 2022-09-26 ENCOUNTER — Ambulatory Visit: Payer: PPO | Admitting: Hematology and Oncology

## 2022-09-29 ENCOUNTER — Encounter: Payer: Self-pay | Admitting: Infectious Disease

## 2022-09-29 ENCOUNTER — Other Ambulatory Visit: Payer: Self-pay

## 2022-09-29 ENCOUNTER — Encounter: Payer: Self-pay | Admitting: Nurse Practitioner

## 2022-09-29 ENCOUNTER — Ambulatory Visit (INDEPENDENT_AMBULATORY_CARE_PROVIDER_SITE_OTHER): Payer: PPO | Admitting: Infectious Disease

## 2022-09-29 ENCOUNTER — Ambulatory Visit: Payer: PPO

## 2022-09-29 VITALS — BP 140/79 | HR 109 | Resp 16 | Ht 64.0 in | Wt 140.0 lb

## 2022-09-29 DIAGNOSIS — E1122 Type 2 diabetes mellitus with diabetic chronic kidney disease: Secondary | ICD-10-CM

## 2022-09-29 DIAGNOSIS — C569 Malignant neoplasm of unspecified ovary: Secondary | ICD-10-CM | POA: Diagnosis not present

## 2022-09-29 DIAGNOSIS — N183 Chronic kidney disease, stage 3 unspecified: Secondary | ICD-10-CM

## 2022-09-29 DIAGNOSIS — Z7689 Persons encountering health services in other specified circumstances: Secondary | ICD-10-CM | POA: Diagnosis not present

## 2022-09-29 DIAGNOSIS — I129 Hypertensive chronic kidney disease with stage 1 through stage 4 chronic kidney disease, or unspecified chronic kidney disease: Secondary | ICD-10-CM | POA: Diagnosis not present

## 2022-09-29 DIAGNOSIS — T8459XD Infection and inflammatory reaction due to other internal joint prosthesis, subsequent encounter: Secondary | ICD-10-CM | POA: Diagnosis not present

## 2022-09-29 DIAGNOSIS — I10 Essential (primary) hypertension: Secondary | ICD-10-CM

## 2022-09-29 DIAGNOSIS — Z7984 Long term (current) use of oral hypoglycemic drugs: Secondary | ICD-10-CM | POA: Diagnosis not present

## 2022-09-29 MED ORDER — CEFADROXIL 500 MG PO CAPS: 1000.0000 mg | ORAL_CAPSULE | Freq: Two times a day (BID) | ORAL | 11 refills | Status: DC

## 2022-09-29 NOTE — Progress Notes (Signed)
Subjective:  Chief complaint: follow-up for PJI with hip pain   Patient ID: Kayla Baker, female    DOB: Apr 08, 1936, 86 y.o.   MRN: [id number]  HPI  86 y.o. female with hx of HTN, hyperlipidemia, breast cancer, cephalosporin allergy and recent ovarian cancer with carcinomatosis who presented to ER w abdominal pain (her ascites) and right  hip pain with abscess found in gluteal muscles that appears to communicate with hip. She is now sp I and D and polyexchange in OR We challenged her with oral cephalexin which she tolerated and we switched her over to cefazolin to treat her prosthetic joint infection.  Kayla Baker's pain has improved dramatically since she was in the hospital she is frustrated though that she still experiences hip pain when she sits down and also when she gets up to pivot and that she feels she is too dependent on her walker compared to when she fractured a bone more than a year ago.  She is tolerating her cefazolin without difficulty which is being given via port.  Chemotherapy is currently on hold  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[facility]) 02/29/2016   skin- 2016- squamous- on right upper arm   Chronic kidney disease    Closed displaced fracture of right femoral neck (HCC) 11/07/2021   Deficiency anemia 11/02/2020   Diabetes mellitus without complication (HCC)    GERD (gastroesophageal reflux disease)    Gout    takes Allopurinol daily   History of chemotherapy    History of colon polyps    benign   History of shingles    Hx of radiation therapy    Hyperlipidemia    Hypertension    Ovarian ca ([facility]) dx'd 03/2020   Personal history of chemotherapy    2017   Personal history of radiation therapy    2017   Vitamin D deficiency     Past Surgical History:  Procedure Laterality Date   ABDOMINAL HYSTERECTOMY  1973   BREAST  BIOPSY Left 02/26/2016   BREAST LUMPECTOMY Left 03/18/2016   CARDIAC CATHETERIZATION     patient denies this procedure   CATARACT EXTRACTION, BILATERAL  2014   right eye 2/14; left eye 3/3   COLONOSCOPY     HAND SURGERY Left 2012   HIP ARTHROPLASTY Right 11/08/2021   Procedure: ARTHROPLASTY BIPOLAR HIP (HEMIARTHROPLASTY) POSTERIOR;  Surgeon: Willaim Sheng, MD;  Location: [facility];  Service: Orthopedics;  Laterality: Right;   INCONTINENCE SURGERY  2006   IR IMAGING GUIDED PORT INSERTION  04/24/2020   IR PARACENTESIS  05/08/2020   IR PARACENTESIS  05/16/2020   IR PARACENTESIS  05/25/2020   KNEE ARTHROSCOPY Right    MASTOPEXY Right 06/16/2017   Procedure: RIGHT BREAST MASTOPEXY;  Surgeon: Irene Limbo, MD;  Location: [facility];  Service: Plastics;  Laterality: Right;   PORT-A-CATH REMOVAL N/A 11/27/2016   Procedure: REMOVAL PORT-A-CATH;  Surgeon: Fanny Skates, MD;  Location: [facility];  Service: General;  Laterality: N/A;   PORTACATH PLACEMENT N/A 04/14/2016   Procedure: INSERTION PORT-A-CATH ;  Surgeon: Fanny Skates, MD;  Location: [facility];  Service: General;  Laterality: N/A;   RADIOACTIVE SEED GUIDED PARTIAL MASTECTOMY WITH AXILLARY SENTINEL LYMPH NODE BIOPSY Left 03/18/2016   Procedure: RADIOACTIVE SEED GUIDED LEFT PARTIAL MASTECTOMY WITH AXILLARY SENTINEL LYMPH NODE BIOPSY  AND BLUE DYE INJECTION;  Surgeon: Fanny Skates, MD;  Location: [facility];  Service: General;  Laterality: Left;   REDUCTION MAMMAPLASTY Right 2018   TOTAL HIP ARTHROPLASTY Right 08/29/2022   Procedure: incision and drainage hip head exchange;  Surgeon: Willaim Sheng, MD;  Location: [facility];  Service: Orthopedics;  Laterality: Right;    Family History  Problem Relation Age of Onset   Stroke Mother 40   Other Mother        history of hysterectomy after last childbirth   Diabetes Father    Alzheimer's disease Father    Hypertension Sister    Lung cancer Sister 28       smoker   Other Sister         history of hysterectomy for fibroids   Breast cancer Sister 62   Cirrhosis Sister    Stroke Maternal Grandmother    Heart Problems Maternal Grandmother    Esophageal cancer Maternal Aunt 74       not a smoker   Parkinson's disease Maternal Aunt    Heart attack Maternal Uncle 65   Pancreatic cancer Cousin 23       paternal 1st cousin   Lung cancer Other        nephew dx. 55s; +smoker   Epilepsy Other    Other Other 12       great niece dx. benign ganglioglioma brain tumor; treated at [facility]   Epilepsy Other        no seizures in 2 years   Colon cancer Neg Hx    Stomach cancer Neg Hx    Colon polyps Neg Hx    Ulcerative colitis Neg Hx       Social History   Socioeconomic History   Marital status: Married    Spouse name: [name]   Number of children: 0   Years of education: Not on file   Highest education level: Not on file  Occupational History   Not on file  Tobacco Use   Smoking status: Never   Smokeless tobacco: Never  Vaping Use   Vaping Use: Never used  Substance and Sexual Activity   Alcohol use: No   Drug use: No   Sexual activity: Not Currently    Birth control/protection: Surgical, Post-menopausal  Other Topics Concern   Not on file  Social History Narrative   Not on file   Social Determinants of Health   Financial Resource Strain: Not on file  Food Insecurity: No Food Insecurity (08/24/2022)   Hunger Vital Sign    Worried About Running Out of Food in the Last Year: Never true    Ran Out of Food in the Last Year: Never true  Transportation Needs: No Transportation Needs (08/24/2022)   PRAPARE - Hydrologist (Medical): No    Lack of Transportation (Non-Medical): No  Physical Activity: Not on file  Stress: Not on file  Social Connections: Not on file    Allergies  Allergen Reactions   Mobic [Meloxicam] Swelling   Prednisone Swelling and Other (See Comments)    Can Not take High doses   Premarin [Estrogens  Conjugated] Hives   Trovan [Alatrofloxacin] Other (See Comments)    Unknown reaction     Current Outpatient Medications:    acetaminophen (TYLENOL) 500 MG tablet, Take 500 mg by mouth every 6 (six) hours as needed for mild pain, headache or fever., Disp: , Rfl:    allopurinol (ZYLOPRIM) 100 MG tablet,  Take 1 tablet (100 mg total) by mouth daily. FOR GOUT PREVENTION, (Patient taking differently: Take 100 mg by mouth daily.), Disp: 90 tablet, Rfl: 3   aspirin EC 81 MG tablet, Take 1 tablet (81 mg total) by mouth 2 (two) times daily. Swallow whole., Disp: 30 tablet, Rfl: 12   ceFAZolin (ANCEF) IVPB, Inject 2 g into the vein every 12 (twelve) hours. Indication:  Prosthetic right hip joint infection  First Dose: Yes Last Day of Therapy:  10/10/22 Labs - Once weekly:  CBC/D and BMP, Labs - Every other week:  ESR and CRP Method of administration: IV Push Method of administration may be changed at the discretion of home infusion pharmacist based upon assessment of the patient and/or caregiver's ability to self-administer the medication ordered., Disp: 78 Units, Rfl: 0   Cholecalciferol (VITAMIN D3) 5000 units CAPS, Take 5,000 Units by mouth daily., Disp: , Rfl:    cyanocobalamin 1000 MCG tablet, Take 1,000 mcg by mouth daily., Disp: , Rfl:    dexamethasone (DECADRON) 4 MG tablet, Take 2 tablets the morning before chemotherapy and then 2 tablets daily for 2 days after chemo, Disp: 30 tablet, Rfl: 1   Ensure Max Protein (ENSURE MAX PROTEIN) LIQD, Take 330 mLs (11 oz total) by mouth 2 (two) times daily., Disp: , Rfl:    ezetimibe (ZETIA) 10 MG tablet, TAKE 1 TABLET BY MOUTH DAILY FOR CHOLESTEROL (Patient taking differently: Take 10 mg by mouth daily.), Disp: 90 tablet, Rfl: 3   gabapentin (NEURONTIN) 300 MG capsule, Take 1 capsule 4 x  /day for Pain (Patient taking differently: Take 300 mg by mouth 4 (four) times daily.), Disp: 360 capsule, Rfl: 3   glimepiride (AMARYL) 1 MG tablet, Take 1 tab by mouth in the  morning if fasting is running 150+. Please do not take this medication if you are ill or not eating as this can cause a low blood sugar. (Patient taking differently: Take 1 mg by mouth See admin instructions. Take 1 tab by mouth daily as needed if fasting is running 150+. Please do not take this medication if you are ill or not eating as this can cause a low blood sugar.), Disp: 90 tablet, Rfl: 1   lidocaine-prilocaine (EMLA) cream, Apply 1 application. topically daily as needed (for port access)., Disp: 30 g, Rfl: 3   magnesium oxide (MAG-OX) 400 (240 Mg) MG tablet, Take 1 tablet (400 mg total) by mouth daily., Disp: 30 tablet, Rfl: 1   metoprolol succinate (TOPROL-XL) 25 MG 24 hr tablet, Take  1 tablet  Daily  for BP                                                          /                                       TAKE                            BY  MOUTH, Disp: 90 tablet, Rfl: 3   ondansetron (ZOFRAN) 8 MG tablet, Take 1 tablet (8 mg total) by mouth every 8 (eight) hours as needed for refractory nausea / vomiting., Disp: 30 tablet, Rfl: 1   oxyCODONE (OXY IR/ROXICODONE) 5 MG immediate release tablet, Take 0.5-1 tablets (2.5-5 mg total) by mouth every 4 (four) hours as needed for severe pain or moderate pain., Disp: 15 tablet, Rfl: 0   pantoprazole (PROTONIX) 40 MG tablet, Take 1 tablet (40 mg total) by mouth daily., Disp: 30 tablet, Rfl: 1   polyethylene glycol (MIRALAX) 17 g packet, Take 17 g by mouth 2 (two) times daily. 17 grams in 6 oz of favorite drink twice a day until bowel movement.  LAXITIVE.  Restart if two days since last bowel movement (Patient taking differently: Take 17 g by mouth 2 (two) times daily.), Disp: 14 packet, Rfl: 0   rosuvastatin (CRESTOR) 5 MG tablet, TAKE 1 TABLET BY MOUTH IN THE EVENING THREE TIMES WEEKLY FOR CHOLESTEROL (Patient taking differently: Take 5 mg by mouth See admin instructions. Monday, Wednesday and Friday), Disp: 36 tablet, Rfl: 3    trolamine salicylate (ASPERCREME) 10 % cream, Apply 1 application topically as needed for muscle pain (Use if you still feel tingles in feet in the morning after taking Gabapentin)., Disp: , Rfl:    vitamin C (ASCORBIC ACID) 500 MG tablet, Take 500 mg by mouth every evening. , Disp: , Rfl:    Review of Systems  Constitutional:  Negative for activity change, appetite change, chills, diaphoresis, fatigue, fever and unexpected weight change.  HENT:  Negative for congestion, rhinorrhea, sinus pressure, sneezing, sore throat and trouble swallowing.   Eyes:  Negative for photophobia and visual disturbance.  Respiratory:  Negative for cough, chest tightness, shortness of breath, wheezing and stridor.   Cardiovascular:  Negative for chest pain, palpitations and leg swelling.  Gastrointestinal:  Negative for abdominal distention, abdominal pain, anal bleeding, blood in stool, constipation, diarrhea, nausea and vomiting.  Genitourinary:  Negative for difficulty urinating, dysuria, flank pain and hematuria.  Musculoskeletal:  Negative for arthralgias, back pain, gait problem, joint swelling and myalgias.  Skin:  Negative for color change, pallor, rash and wound.  Neurological:  Negative for dizziness, tremors, weakness and light-headedness.  Hematological:  Negative for adenopathy. Does not bruise/bleed easily.  Psychiatric/Behavioral:  Negative for agitation, behavioral problems, confusion, decreased concentration, dysphoric mood and sleep disturbance.        Objective:   Physical Exam Constitutional:      General: She is not in acute distress.    Appearance: Normal appearance. She is well-developed. She is not ill-appearing or diaphoretic.  HENT:     Head: Normocephalic and atraumatic.     Right Ear: Hearing and external ear normal.     Left Ear: Hearing and external ear normal.     Nose: No nasal deformity or rhinorrhea.  Eyes:     General: No scleral icterus.    Conjunctiva/sclera:  Conjunctivae normal.     Right eye: Right conjunctiva is not injected.     Left eye: Left conjunctiva is not injected.     Pupils: Pupils are equal, round, and reactive to light.  Neck:     Vascular: No JVD.  Cardiovascular:     Rate and Rhythm: Normal rate and regular rhythm.     Heart sounds: Normal heart sounds, S1 normal and S2 normal. No murmur heard.    No friction rub.  Abdominal:  General: Bowel sounds are normal. There is no distension.     Palpations: Abdomen is soft.     Tenderness: There is no abdominal tenderness.  Musculoskeletal:        General: Normal range of motion.     Right shoulder: Normal.     Left shoulder: Normal.     Cervical back: Normal range of motion and neck supple.     Right hip: Normal.     Left hip: Normal.     Right knee: Normal.     Left knee: Normal.  Lymphadenopathy:     Head:     Right side of head: No submandibular, preauricular or posterior auricular adenopathy.     Left side of head: No submandibular, preauricular or posterior auricular adenopathy.     Cervical: No cervical adenopathy.     Right cervical: No superficial or deep cervical adenopathy.    Left cervical: No superficial or deep cervical adenopathy.  Skin:    General: Skin is warm and dry.     Coloration: Skin is not pale.     Findings: No abrasion, bruising, ecchymosis, erythema, lesion or rash.     Nails: There is no clubbing.  Neurological:     Mental Status: She is alert and oriented to person, place, and time.     Sensory: No sensory deficit.     Coordination: Coordination normal.     Gait: Gait normal.  Psychiatric:        Attention and Perception: She is attentive.        Speech: Speech normal.        Behavior: Behavior normal. Behavior is cooperative.        Thought Content: Thought content normal.        Judgment: Judgment normal.     Port 09/29/2022:          Assessment & Plan:   Prosthetic hip infection status post I&D and poly exchange with  methicillin sensitive Staphylococcus epidermidis isolated  She will finish her cefazolin on November 10 and then we will switch her over to cefadroxil 500 mg 2 tablets twice daily.  I will see her at the end of November or recheck her inflammatory markers and labs at that point in time  Ovarian cancer: Chemotherapy is currently on hold happy to coordinate with her oncologist if chemo is going to be restarted I will note that she is going to be on protracted oral antibiotics at least a years worth potentially longer if she goes on immunosuppressive therapy.  I spent 32 minutes with the patient including than 50% of the time in face to face counseling of the patient husband during her prosthetic joint infection,  along with review of medical records in preparation for the visit and during the visit and in coordination of her care.

## 2022-10-02 DIAGNOSIS — L02419 Cutaneous abscess of limb, unspecified: Secondary | ICD-10-CM | POA: Diagnosis not present

## 2022-10-06 DIAGNOSIS — T8451XD Infection and inflammatory reaction due to internal right hip prosthesis, subsequent encounter: Secondary | ICD-10-CM | POA: Diagnosis not present

## 2022-10-06 DIAGNOSIS — N390 Urinary tract infection, site not specified: Secondary | ICD-10-CM | POA: Diagnosis not present

## 2022-10-06 DIAGNOSIS — C50912 Malignant neoplasm of unspecified site of left female breast: Secondary | ICD-10-CM | POA: Diagnosis not present

## 2022-10-06 DIAGNOSIS — C786 Secondary malignant neoplasm of retroperitoneum and peritoneum: Secondary | ICD-10-CM | POA: Diagnosis not present

## 2022-10-06 DIAGNOSIS — L02415 Cutaneous abscess of right lower limb: Secondary | ICD-10-CM | POA: Diagnosis not present

## 2022-10-06 DIAGNOSIS — Z452 Encounter for adjustment and management of vascular access device: Secondary | ICD-10-CM | POA: Diagnosis not present

## 2022-10-06 DIAGNOSIS — C569 Malignant neoplasm of unspecified ovary: Secondary | ICD-10-CM | POA: Diagnosis not present

## 2022-10-06 DIAGNOSIS — D61818 Other pancytopenia: Secondary | ICD-10-CM | POA: Diagnosis not present

## 2022-10-06 DIAGNOSIS — I129 Hypertensive chronic kidney disease with stage 1 through stage 4 chronic kidney disease, or unspecified chronic kidney disease: Secondary | ICD-10-CM | POA: Diagnosis not present

## 2022-10-06 DIAGNOSIS — E1122 Type 2 diabetes mellitus with diabetic chronic kidney disease: Secondary | ICD-10-CM | POA: Diagnosis not present

## 2022-10-07 DIAGNOSIS — M25551 Pain in right hip: Secondary | ICD-10-CM | POA: Diagnosis not present

## 2022-10-07 DIAGNOSIS — T8459XD Infection and inflammatory reaction due to other internal joint prosthesis, subsequent encounter: Secondary | ICD-10-CM | POA: Diagnosis not present

## 2022-10-08 ENCOUNTER — Encounter: Payer: Self-pay | Admitting: Family Medicine

## 2022-10-08 ENCOUNTER — Other Ambulatory Visit: Payer: PPO | Admitting: Family Medicine

## 2022-10-08 DIAGNOSIS — R531 Weakness: Secondary | ICD-10-CM | POA: Diagnosis not present

## 2022-10-08 DIAGNOSIS — Z515 Encounter for palliative care: Secondary | ICD-10-CM | POA: Insufficient documentation

## 2022-10-08 NOTE — Progress Notes (Signed)
[facility] Consult Note Telephone: [phone]  Fax: [phone]    Date of encounter: 10/08/22 2:10 PM PATIENT NAME: Kayla Baker [address] [place] [address]   [id number] (home)  DOB: 10-Jul-1936 MRN: [id number] PRIMARY CARE PROVIDER:    [name], MD,  [address] [place] [address] [phone]  REFERRING PROVIDER:   Unk Baker, [place],  [place] [address] [phone]  RESPONSIBLE PARTY:    Contact Information     Name Relation Home Work Mobile   Kayla Baker,Kayla Baker [phone]  [phone]        I met face to face with patient and spouse Kayla Baker in their home. Palliative Care was asked to follow this patient by consultation request of  Kayla [facility], MD to address advance care planning and complex medical decision making. This is a follow up visit   ASSESSMENT , SYMPTOM MANAGEMENT AND PLAN / RECOMMENDATIONS:   Palliative Care Encounter Reaffirmed that until hip prosthesis infection is controlled that any infection predisposes pt to increased risk of morbidity and mortality.     Educated on markers of Sed rate and CRP are used to indicate how infection and inflammation are doing.  Pt and spouse to contact Kayla Baker if and when they want to go on Palliative or Hospice.  They do not wish to open to service today.  2.  Generalized weakness/prosthetic hip infection Encouraged pt to check with ID and ortho regarding when or if she will be able to have PT or get in water for swimming.      Advance Care Planning/Goals of Care: Goals include to maximize quality of life and symptom management. Patient gave her permission to discuss MOST form options. Our advance care planning conversation included a discussion about:     Experiences with loved ones who have been seriously ill or have died-sister died with  [place] in 10/2021 from breast cancer Exploration of personal, cultural or spiritual beliefs that might influence medical decisions-pt is hoping to return to some sort of maintenance chemotherapy to prevent further spread of her ovarian cancer. She believes in God and misses her sister. Spouse was visibly uncomfortable with discussion of any possible failure of the hip prosthesis.  He is currently administering her IV antibiotics BID and on 10/10/22 she will transition to oral antibiotics for a period of 1 year. Pt does not believe that she is at a point where she is ready to have Palliative Care come in.  She has a Higher education careers adviser from [place] who previously worked for a different hospice that is accessing her port, changing her dressings and flushing her port weekly that takes her VS and addresses her symptoms. Exploration of goals of care in the event of a sudden injury or illness-currently full code Identification of a healthcare agent-spouse Kayla Baker Brief review of an advance directive document -MOST   CODE STATUS: Full code at present    Follow up Palliative Care Visit: Left phone number at patient's/spouses request that they can follow up with [facility] to start Palliative Service or look at eligibility for Hospice if desired as both indicate that currently they do not feel as if they need the additional support or check up.  Explained differences in services and eligibility for Hospice based on terminal illness with decline in function and/or weight loss.  Advised that with Palliative there is no associated limit in time and  that when people go on Hospice studies have shown an increase in lifespan likely attributable to better symptom control with higher satisfaction and lower symptom burden.   This visit was coded based on medical decision making (MDM).  PPS: 60%  HOSPICE ELIGIBILITY/DIAGNOSIS: TBD  Chief Complaint:   HISTORY OF PRESENT ILLNESS:  Kayla Baker is a 86 y.o.  year old female with ovarian cancer, peritoneal carcinomatosis and hx of left breast cancer.  She also has HTN, aortic atherosclerosis, RLL nodule, recent acute pancreatitis, CKD stage 3 due to type 2 DM, HLD, mild non-proliferative retinopathy of both eyes, chemotherapy induced peripheral neuropathy, osteopenia, adhesive capsulitis right shoulder, recent prosthetic hip infection currently being treated, AKI , acute UTI, pancytopenia, Vitamin D deficiency, gout, hypomagnesemia, generalized weakness, hx of therapeutic radiation, hyperbilirubinemia, hypokalemia, and severe protein calorie malnutrition.  Pseudomonas aeruginosa 10-49,000 cfu/ml but pt has been on Cefazolin BID IV since 08/29/22 and runs through 10/10/22 when she will change to Cefadroxil.  Sees Kayla Baker with Urology.  No falls since December. She says that prior to when she began having pain after it healed, was ambulating independently and was driving.  Her appetite is improving since she is starting to feel better.  History obtained from review of EMR, discussion with spouse Kayla Baker and/or Kayla Baker.   I reviewed EMR for available labs, medications, imaging, studies and related documents.  There are no new records available.   ROS General: "not having a great day" EYES: denies vision changes ENMT: denies dysphagia Cardiovascular: denies chest pain, denies DOE Pulmonary: denies cough, denies increased SOB, has 3 pillow orthopnea Abdomen: intermittent constipation, endorses continence of bowel GU: denies dysuria, endorses continence of urine MSK:  denies increased weakness,  no falls reported Skin: denies rashes or wounds Neurological: denies pain, denies insomnia Psych: Endorses positive mood Heme/lymph/immuno: denies bruises, abnormal bleeding  Physical Exam: Current and past weights: Constitutional: NAD General: frail appearing, thin EYES: anicteric sclera, lids intact, no discharge  ENMT: intact hearing, oral mucous  membranes moist, dentition intact CV:  no visible cyanosis, BLE edema Pulmonary: no visible retractions   Thank you for the opportunity to participate in the care of Kayla Baker.  The palliative care team will continue to follow. Please call our office at [phone] if we can be of additional assistance.   Marijo Conception, FNP -C  COVID-19 PATIENT SCREENING TOOL Asked and negative response unless otherwise noted:   Have you had symptoms of covid, tested positive or been in contact with someone with symptoms/positive test in the past 5-10 days?  NO

## 2022-10-13 ENCOUNTER — Telehealth: Payer: Self-pay

## 2022-10-13 NOTE — Telephone Encounter (Signed)
She called and left a message. She just finished IV antibiotics and was told to call the office. She is wanting to resume care if possible. Dr. Tommy Medal will will coordinate if needed.

## 2022-10-13 NOTE — Telephone Encounter (Signed)
I sent LOS for tomorrow

## 2022-10-14 ENCOUNTER — Inpatient Hospital Stay: Payer: PPO | Attending: Gynecologic Oncology | Admitting: Hematology and Oncology

## 2022-10-14 ENCOUNTER — Encounter: Payer: Self-pay | Admitting: Hematology and Oncology

## 2022-10-14 VITALS — BP 137/65 | HR 88 | Temp 98.7°F | Resp 18 | Ht 64.0 in | Wt 138.4 lb

## 2022-10-14 DIAGNOSIS — C786 Secondary malignant neoplasm of retroperitoneum and peritoneum: Secondary | ICD-10-CM | POA: Diagnosis not present

## 2022-10-14 DIAGNOSIS — Z90721 Acquired absence of ovaries, unilateral: Secondary | ICD-10-CM | POA: Diagnosis not present

## 2022-10-14 DIAGNOSIS — C561 Malignant neoplasm of right ovary: Secondary | ICD-10-CM | POA: Diagnosis not present

## 2022-10-14 DIAGNOSIS — D61818 Other pancytopenia: Secondary | ICD-10-CM | POA: Diagnosis not present

## 2022-10-14 DIAGNOSIS — Z79899 Other long term (current) drug therapy: Secondary | ICD-10-CM | POA: Diagnosis not present

## 2022-10-14 DIAGNOSIS — R600 Localized edema: Secondary | ICD-10-CM | POA: Insufficient documentation

## 2022-10-14 DIAGNOSIS — L02419 Cutaneous abscess of limb, unspecified: Secondary | ICD-10-CM | POA: Diagnosis not present

## 2022-10-14 DIAGNOSIS — C569 Malignant neoplasm of unspecified ovary: Secondary | ICD-10-CM | POA: Diagnosis not present

## 2022-10-14 DIAGNOSIS — Z853 Personal history of malignant neoplasm of breast: Secondary | ICD-10-CM | POA: Diagnosis not present

## 2022-10-14 NOTE — Assessment & Plan Note (Signed)
I have reviewed recommendation from infectious disease As above, she will continue chronic suppressive antibiotics along with G-CSF support during chemo

## 2022-10-14 NOTE — Progress Notes (Signed)
[place] OFFICE PROGRESS NOTE  Patient Care Team: [name], MD as PCP - Kinnie Scales, OD as Referring Physician (Optometry) [name], MD as Consulting Physician (Dermatology) Latanya Maudlin, MD as Consulting Physician (Orthopedic Surgery) Inda Castle, MD (Inactive) as Consulting Physician (Gastroenterology) Heath Lark, MD as Consulting Physician (Hematology and Oncology) Madelon Lips, MD as Consulting Physician (Nephrology) Carleene Mains, [facility] (Pharmacist)  ASSESSMENT & PLAN:  Ovarian cancer [facility]) She has only received 2 doses of Taxotere, her treatment course was complicated by severe hip infection She has completed intravenous antibiotics and is currently on chronic suppressive oral antibiotics as directed by infectious disease team I recommend repeat CT imaging for staging prior to resumption of chemotherapy Due to the upcoming holidays, I recommend repeat CT imaging in 2 weeks and I will see her back afterwards and resume treatment Due to's recent severe infection, I recommend addition of G-CSF support for future treatment  Pancytopenia, acquired ([place]) Her recent blood work suggested stable pancytopenia Observe closely  Abscess of hip I have reviewed recommendation from infectious disease As above, she will continue chronic suppressive antibiotics along with G-CSF support during chemo  Bilateral lower extremity edema She has bilateral lower extremity edema, right greater than the left This could be related to her recent infection; carcinomatosis causing venous obstruction cannot be excluded At this point, I do not recommend diuretic therapy as it could worsen her renal function I recommend elastic compression hose as tolerated and repeat imaging study as above  Orders Placed This Encounter  Procedures   CT CHEST ABDOMEN PELVIS W CONTRAST    Standing Status:   Future    Standing Expiration Date:   10/15/2023    Order Specific  Question:   Preferred imaging location?    Answer:   [facility]    Order Specific Question:   Radiology Contrast Protocol - do NOT remove file path    Answer:   [email or web address]   CMP ([place] only)    Standing Status:   Future    Standing Expiration Date:   10/15/2023   CBC with Differential ([place] Only)    Standing Status:   Future    Standing Expiration Date:   10/15/2023    All questions were answered. The patient knows to call the clinic with any problems, questions or concerns. The total time spent in the appointment was 40 minutes encounter with patients including review of chart and various tests results, discussions about plan of care and coordination of care plan   Heath Lark, MD 10/14/2022 1:12 PM  INTERVAL HISTORY: Please see below for problem oriented charting. she returns for treatment follow-up accompanied by her husband The patient completed last dose of intravenous antibiotics on November 10 She denies recent fever or chills She has intermittent abdominal bloating but has regular bowel movement and denies recent nausea or vomiting She has ongoing hip pain She is disturbed by bilateral lower extremity edema, right greater than left We spent a lot of time reviewing plan of care I discussed the role of G-CSF support with future treatment  REVIEW OF SYSTEMS:   Constitutional: Denies fevers, chills or abnormal weight loss Eyes: Denies blurriness of vision Ears, nose, mouth, throat, and face: Denies mucositis or sore throat Respiratory: Denies cough, dyspnea or wheezes Cardiovascular: Denies palpitation, chest discomfort  Gastrointestinal:  Denies nausea, heartburn or change in bowel habits Skin: Denies abnormal skin rashes Lymphatics: Denies new lymphadenopathy or easy bruising Neurological:Denies numbness,  tingling or new weaknesses Behavioral/Psych: Mood is stable, no new changes  All other systems  were reviewed with the patient and are negative.  I have reviewed the past medical history, past surgical history, social history and family history with the patient and they are unchanged from previous note.  ALLERGIES:  is allergic to mobic [meloxicam], prednisone, premarin [estrogens conjugated], and trovan [alatrofloxacin].  MEDICATIONS:  Current Outpatient Medications  Medication Sig Dispense Refill   acetaminophen (TYLENOL) 500 MG tablet Take 500 mg by mouth every 6 (six) hours as needed for mild pain, headache or fever.     allopurinol (ZYLOPRIM) 100 MG tablet Take 1 tablet (100 mg total) by mouth daily. FOR GOUT PREVENTION, (Patient taking differently: Take 100 mg by mouth daily.) 90 tablet 3   aspirin EC 81 MG tablet Take 1 tablet (81 mg total) by mouth 2 (two) times daily. Swallow whole. 30 tablet 12   cefadroxil (DURICEF) 500 MG capsule Take 2 capsules (1,000 mg total) by mouth 2 (two) times daily. 120 capsule 11   Cholecalciferol (VITAMIN D3) 5000 units CAPS Take 5,000 Units by mouth daily.     cyanocobalamin 1000 MCG tablet Take 1,000 mcg by mouth daily.     dexamethasone (DECADRON) 4 MG tablet Take 2 tablets the morning before chemotherapy and then 2 tablets daily for 2 days after chemo 30 tablet 1   Ensure Max Protein (ENSURE MAX PROTEIN) LIQD Take 330 mLs (11 oz total) by mouth 2 (two) times daily.     ezetimibe (ZETIA) 10 MG tablet TAKE 1 TABLET BY MOUTH DAILY FOR CHOLESTEROL (Patient taking differently: Take 10 mg by mouth daily.) 90 tablet 3   gabapentin (NEURONTIN) 300 MG capsule Take 1 capsule 4 x  /day for Pain (Patient taking differently: Take 300 mg by mouth 4 (four) times daily.) 360 capsule 3   glimepiride (AMARYL) 1 MG tablet Take 1 tab by mouth in the morning if fasting is running 150+. Please do not take this medication if you are ill or not eating as this can cause a low blood sugar. (Patient taking differently: Take 1 mg by mouth See admin instructions. Take 1 tab by  mouth daily as needed if fasting is running 150+. Please do not take this medication if you are ill or not eating as this can cause a low blood sugar.) 90 tablet 1   lidocaine-prilocaine (EMLA) cream Apply 1 application. topically daily as needed (for port access). 30 g 3   magnesium oxide (MAG-OX) 400 (240 Mg) MG tablet Take 1 tablet (400 mg total) by mouth daily. 30 tablet 1   metoprolol succinate (TOPROL-XL) 25 MG 24 hr tablet Take  1 tablet  Daily  for BP                                                          /                                       TAKE                            BY                                   MOUTH 90 tablet 3   ondansetron (ZOFRAN) 8 MG tablet Take 1 tablet (8 mg total) by mouth every 8 (eight) hours as needed for refractory nausea / vomiting. 30 tablet 1   oxyCODONE (OXY IR/ROXICODONE) 5 MG immediate release tablet Take 0.5-1 tablets (2.5-5 mg total) by mouth every 4 (four) hours as needed for severe pain or moderate pain. 15 tablet 0   pantoprazole (PROTONIX) 40 MG tablet Take 1 tablet (40 mg total) by mouth daily. 30 tablet 1   polyethylene glycol (MIRALAX) 17 g packet Take 17 g by mouth 2 (two) times daily. 17 grams in 6 oz of favorite drink twice a day until bowel movement.  LAXITIVE.  Restart if two days since last bowel movement (Patient taking differently: Take 17 g by mouth 2 (two) times daily.) 14 packet 0   rosuvastatin (CRESTOR) 5 MG tablet TAKE 1 TABLET BY MOUTH IN THE EVENING THREE TIMES WEEKLY FOR CHOLESTEROL (Patient taking differently: Take 5 mg by mouth See admin instructions. Monday, Wednesday and Friday) 36 tablet 3   trolamine salicylate (ASPERCREME) 10 % cream Apply 1 application topically as needed for muscle pain (Use if you still feel tingles in feet in the morning after taking Gabapentin).     vitamin C (ASCORBIC ACID) 500 MG tablet Take 500 mg by mouth every evening.      No current facility-administered medications for this visit.    SUMMARY OF  ONCOLOGIC HISTORY: Oncology History Overview Note  HRD positive Progressed and intolerant to olaparib Progressed on carboplatin, taxol and gemzar   History of left breast cancer  02/26/2016 Initial Diagnosis   Left breast biopsy 11:00 position: invasive ductal carcinoma with DCIS, ER 90%, PR 10%, HER-2 negative, Ki-67 30%, grade 2, 2.2 cm palpable lesion T2 N0 stage II a clinical stage   03/18/2016 Surgery   Left lumpectomy: Invasive ductal carcinoma, grade 2, 6.3 cm, with high-grade DCIS, margins negative, 0/4 lymph nodes negative, ER 90%, via 10%, HER-2 negative ratio 0.97, Ki-67 30%, T3 N0 stage IIB   03/25/2016 Procedure   Genetic testing is negative for pathogenic mutations within any of the 20 Genes on the breast/ovarian cancer panel   04/04/2016 Oncotype testing   Oncotype DX recurrence score 37, 25% 10 year distant risk of recurrence   04/17/2016 - 07/31/2016 Chemotherapy   Adjuvant chemotherapy with dose dense Adriamycin and Cytoxan followed by Abraxane weekly 8 ( discontinued due to neuropathy)   09/01/2016 - 09/26/2016 Radiation Therapy   Adj XRT 1) Left breast: 42.5 Gy in 17 fractions. 2) Left breast boost: 7.5 Gy in 3 fractions.   12/02/2016 -  Anti-estrogen oral therapy   Anastrozole 1 mg daily   09/05/2021 - 05/21/2022 Chemotherapy   Patient is on Treatment Plan : OVARIAN RECURRENT 3RD LINE Carboplatin D1 / Gemcitabine D1,8 (4/800) q21d     07/08/2022 - 07/29/2022 Chemotherapy   Patient is on Treatment Plan : ovarian Docetaxel q21d     07/08/2022 -  Chemotherapy   Patient is on Treatment Plan : BREAST Docetaxel (100) q21d     Ovarian cancer (HCC)  04/03/2020 Imaging   US pelvis Complex cystic and solid mass in LEFT adnexa 12.5 cm diameter question cystic ovarian neoplasm; recommend correlation with serum tumor markers and further evaluation by MR imaging with and without contrast.     04/04/2020 Imaging   US venous Doppler No evidence of deep venous thrombosis in the right  lower extremity. Left common femoral vein also patent.     04/15/2020 Imaging   MRI pelvis 1. Large complex solid and cystic mass arising from the right adnexa measuring 10.8 by 11.5 by 11.1 cm. This has an aggressive appearance with extensive enhancing mural soft tissue components. Findings are highly suspicious for malignant ovarian neoplasm. 2. Extensive bilateral retroperitoneal and bilateral iliac adenopathy compatible with metastatic disease. 3. Signs of extensive peritoneal carcinomatosis including ascites, enhancement, thickening and nodularity of the peritoneal reflections, omental caking and bulky peritoneal nodularity. 4. Suspected serosal involvement of the dome of bladder with loss of normal fat plane. Cannot rule out mural invasion by tumor.   04/17/2020 Tumor Marker   Patient's tumor was tested for the following markers: CA-125 Results of the tumor marker test revealed 1762.   04/20/2020 Cancer Staging   Staging form: Ovary, Fallopian Tube, and Primary Peritoneal Carcinoma, AJCC 8th Edition - Clinical: FIGO Stage IIIC (cT3, cN1, cM0) - Signed by , , MD on 04/20/2020   04/23/2020 Imaging   1. Redemonstrated dominant mixed solid and cystic mass arising from the vicinity of the right ovary measuring at least 11.5 x 10.7 cm, not significantly changed compared to prior MR, and consistent with primary ovarian malignancy.  2. Numerous bulky retroperitoneal, bilateral iliac, and pelvic sidewall lymph nodes. 3. Moderate volume ascites throughout the abdomen and pelvis with subtle thickening and nodularity throughout the peritoneum, and extensive bulky nodular metastatic disease of the omentum. 4. Constellation of findings is consistent with advanced nodal and peritoneal metastatic disease. 5. There are prominent subcentimeter epicardial lymph nodes, nonspecific although suspicious for nodal metastatic disease. No definite nodal metastatic disease in the chest. Attention on  follow-up. 6. There are multiple small subpleural nodules at the right lung base overlying the diaphragm, measuring up to 7 mm. These are generally nonspecific and less favored to represent pulmonary metastatic disease given distribution. Attention on follow-up. 7. Somewhat coarse contour of the liver, suggestive of cirrhosis, although out without overt morphologic stigmata. 8. Aortic Atherosclerosis (ICD10-I70.0).     04/24/2020 Procedure   Successful placement of a right internal jugular approach power injectable Port-A-Cath. The catheter is ready for immediate use.     05/02/2020 Tumor Marker   Patient's tumor was tested for the following markers: CA-125 Results of the tumor marker test revealed 1921   05/03/2020 - 12/12/2020 Chemotherapy   The patient had carboplatin and taxol for chemotherapy treatment.     05/08/2020 Procedure   Successful ultrasound-guided paracentesis yielding 2.6 liters of peritoneal fluid.   05/16/2020 Procedure   Successful ultrasound-guided paracentesis yielding 3.2 liters of peritoneal fluid   05/25/2020 Procedure   Successful ultrasound-guided paracentesis yielding 3 liters of peritoneal fluid.     06/01/2020 Procedure   Successful ultrasound-guided therapeutic paracentesis yielding 1.7 liters of peritoneal fluid.   06/14/2020 Tumor Marker   Patient's tumor was tested for the following markers: CA-125 Results of the tumor marker test revealed 1309   06/20/2020 Imaging   1. Dominant mixed cystic and solid mass in the central pelvis is stable in the interval. 2. Clear interval decrease in retroperitoneal and pelvic sidewall lymphadenopathy. The bulky omental disease has also clearly decreased in the interval. 3. Interval decrease in ascites. 4. Stable 8 mm subpleural nodule along the diaphragm. 5. Aortic Atherosclerosis (ICD10-I70.0).   07/10/2020 Tumor Marker   Patient's tumor was tested for the following markers: CA-125 Results of the tumor marker test  revealed 220   08/03/2020 Tumor Marker   Patient's tumor was tested for the following markers: CA-125 Results of the   tumor marker test revealed 53.3.   09/03/2020 Tumor Marker   Patient's tumor was tested for the following markers: CA-125 Results of the tumor marker test revealed 29.7   09/20/2020 Imaging   1. Substantial improvement. The right ovarian cystic and solid mass is half the volume that it measured on 06/20/2020. Similar reduction in the bulk of the omental caking of tumor. Prior ascites is resolved and the retroperitoneal adenopathy is markedly improved. 2. Other imaging findings of potential clinical significance: Stable pleural-based nodularity along the right hemidiaphragm measuring about 1.1 by 0.7 by 0.3 cm. Stable hypodense lesion of the right kidney upper pole, probably a cyst although the configuration of this lesion makes it difficult to obtain accurate density measurements. Left ovarian cyst, stable. Chondrocalcinosis involving the acetabular labra. Mild lumbar spondylosis and degenerative disc disease. 3. Aortic atherosclerosis.     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 (stage ypT3c), predominantly viable with focal fibrosis and hemosiderin laden macrophages (possible mild treatment effect)  B: Ovary and fallopian tube, right, salpingo-oophorectomy - High grade serous carcinoma involving right ovary and fallopian tube with surface involvement, size up to 8 cm  - Areas of necrosis and hemosiderin laden macrophages suggestive of treatment effect - 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 - See synoptic report and comment  C: Ovary and fallopian tube, left, salpingo-oophorectomy - High grade serous  carcinoma involving the left fallopian tube, size up to 0.5 cm - Ovary with no definite involvement by carcinoma identified - Nodular area of endometriosis and peritoneal inclusion cyst also present - See synoptic report and comment   Procedure:    Bilateral salpingo-oophorectomy    Procedure:    Omentectomy    Specimen Integrity of Right Ovary:    Intact with surface involvement by tumor    Specimen Integrity of Left Ovary:    Capsule intact   TUMOR Tumor Site:    Right fallopian tube: favor as primary site; also involves right ovary and left fallopian tube  Histologic Type:    Serous carcinoma  Histologic Grade:    High grade  Tumor Size:    Greatest Dimension (Centimeters): in fallopian tube: 1.0 cm; in ovary: 8.0 cm Ovarian Surface Involvement:    Present    Laterality:    Right  Fallopian Tube Surface Involvement:    Present    Laterality:    Bilateral  Other Tissue / Organ Involvement:    Right ovary  Other Tissue / Organ Involvement:    Right fallopian tube  Other Tissue / Organ Involvement:    Left fallopian tube  Other Tissue / Organ Involvement:    Omentum  Largest Extrapelvic Peritoneal Focus:    Macroscopic (greater than 2 cm)  Peritoneal / Ascitic Fluid:    Not submitted / unknown  Pleural Fluid:    Not submitted / unknown  Treatment Effect:    No definite or minimal response identified (chemotherapy response score [CRS] 1)   LYMPH NODES Regional Lymph Nodes:    No lymph nodes submitted or found   PATHOLOGIC STAGE CLASSIFICATION (pTNM, AJCC 8th Edition) TNM Descriptors:    y (post-treatment)  Primary Tumor (pT):    pT3c  Regional Lymph Nodes (pN):    pNX   FIGO STAGE FIGO Stage:    IIIC     ADDITIONAL FINDINGS Additional Findings:    Serous tubal intraepithelial carcinoma (STIC)  Additional Findings:    Left ovary with no definite carcinoma identified, left-sided nodule of endometriosis and peritoneal inclusion cyst    10/18/2020 - 10/20/2020 Hospital Admission    She was admitted to [facility] hospital for interval debulking surgery   11/19/2020 Tumor Marker   Patient's tumor was tested for the following markers: CA-125. Results of the tumor marker test revealed 38.3   12/12/2020 Tumor Marker   Patient's tumor was tested for the following markers CA-125. Results of the tumor marker test revealed 42.3.   01/02/2021 Tumor Marker   Patient's tumor was tested for the following markers: CA-125. Results of the tumor marker test revealed 37.3   01/29/2021 Tumor Marker   Patient's tumor was tested for the following markers: CA-125 Results of the tumor marker test revealed 44.3   01/29/2021 Imaging   1. Interval increase in loculated appearing ascites in the low central abdomen and pelvis. 2. There is extensive peritoneal nodularity surrounding this fluid, the nodularity itself not appreciably changed in appearance compared to prior examination. 3. Additional peritoneal nodularity and/or mesenteric lymph nodes are unchanged. 4. Interval decrease in size of a low-attenuation nodule overlying the right hemidiaphragm, now measuring 1.2 cm, previously 1.8 cm when measured similarly. Findings are consistent with treatment response of a metastatic nodule. No other evidence of intrathoracic metastatic disease. 5. Status post hysterectomy, oophorectomy, and omentectomy.   01/31/2021 - 08/26/2021 Chemotherapy   She is started on Olaparib       03/04/2021 Tumor Marker   Patient's tumor was tested for the following markers: CA-125 Results of the tumor marker test revealed 39.7   04/01/2021 Tumor Marker   Patient's tumor was tested for the following markers: CA-125 Results of the tumor marker test revealed 32.3   05/10/2021 Imaging   1. No significant change in peritoneal carcinomatosis since previous study of 3 months ago, primarily in the pelvis where there is complex fluid and peritoneal nodularity. 2. No evidence of solid visceral organ metastasis. No evidence of bowel  or ureteral obstruction. 3.  Aortic Atherosclerosis (ICD10-I70.0).   06/13/2021 Tumor Marker   Patient's tumor was tested for the following markers: CA-125. Results of the tumor marker test revealed 48.3.   07/29/2021 Tumor Marker   Patient's tumor was tested for the following markers: CA-125. Results of the tumor marker test revealed 54.   08/25/2021 Imaging   1. There is extensive peritoneal soft tissue thickening and nodularity, particularly about the low right hemipelvis, which is similar to prior examination. Some small peritoneal nodules throughout the abdomen and pelvis are slightly enlarged, other nodules are unchanged. 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 2. No significant change in a subpleural nodule overlying the right hemidiaphragm. 3. Unchanged, loculated small volume pelvic ascites. 4. Status post hysterectomy, oophorectomy, and omentectomy. 5. Coronary artery disease.   Aortic Atherosclerosis (ICD10-I70.0).     08/26/2021 Tumor Marker   Patient's tumor was tested for the following markers: CA-125. Results of the tumor marker test revealed 56.   09/04/2021 Tumor Marker   Patient's tumor was tested for the following markers: CA-125. Results of the tumor marker test revealed 55.4.   09/05/2021 - 05/21/2022 Chemotherapy   Patient is on Treatment Plan : OVARIAN RECURRENT 3RD LINE Carboplatin D1 / Gemcitabine D1,8 (4/800) q21d     10/07/2021 Tumor Marker   Patient's tumor was tested for the following markers: CA-125. Results of the   tumor marker test revealed 51.9.   11/05/2021 Tumor Marker   Patient's tumor was tested for the following markers: CA-125. Results of the tumor marker test revealed 45.6.   11/27/2021 Imaging   1. Overall stable to slightly improved ill-defined soft tissue density throughout the pelvis. 2. Slight interval decrease in size of the omental lesions. 3. Stable free pelvic fluid. 4. New surgical  changes from a right hip replacement. 5. New nodular and somewhat triangular density at the left lung base is likely an area of atelectasis. 6. Aortic atherosclerosis.   12/24/2021 Tumor Marker   Patient's tumor was tested for the following markers: CA-125. Results of the tumor marker test revealed 39.6.   02/04/2022 Tumor Marker   Patient's tumor was tested for the following markers: CA-125. Results of the tumor marker test revealed 36.3.   03/05/2022 Imaging   1. Similar to mild interval increase in size of right pericolic goiter nodules and omental nodules. 2. Similar appearance of the amorphous soft tissue within the pelvis. There is decreased fluid component   03/06/2022 Tumor Marker   Patient's tumor was tested for the following markers: CA-125. Results of the tumor marker test revealed 56.2.   04/07/2022 Tumor Marker   Patient's tumor was tested for the following markers: CA-125. Results of the tumor marker test revealed 65.5.   05/13/2022 Tumor Marker   Patient's tumor was tested for the following markers: CA-125. Results of the tumor marker test revealed 66.5.   06/12/2022 Imaging   1. Slight increase in prominence of the peritoneal and omental tumor deposits, which are most thickly banded in the lower pelvis. 2. Questionable wall thickening in the descending duodenum, cannot exclude duodenitis. 3. Other imaging findings of potential clinical significance: Bibasilar scarring. Aortic Atherosclerosis (ICD10-I70.0). Cholelithiasis.   07/08/2022 - 07/29/2022 Chemotherapy   Patient is on Treatment Plan : ovarian Docetaxel q21d     07/08/2022 -  Chemotherapy   Patient is on Treatment Plan : BREAST Docetaxel (100) q21d     07/09/2022 Tumor Marker   Patient's tumor was tested for the following markers: CA-125. Results of the tumor marker test revealed 103..   08/20/2022 Tumor Marker   Patient's tumor was tested for the following markers: CA-125. Results of the tumor marker test revealed  109.     PHYSICAL EXAMINATION: ECOG PERFORMANCE STATUS: 2 - Symptomatic, <50% confined to bed  Vitals:   10/14/22 1203  BP: 137/65  Pulse: 88  Resp: 18  Temp: 98.7 F (37.1 C)  SpO2: 100%   Filed Weights   10/14/22 1203  Weight: 138 lb 6.4 oz (62.8 kg)    GENERAL:alert, no distress and comfortable HEART: regular rate & rhythm and no murmurs with lower extremity edema, right greater than left ABDOMEN:abdomen appears distended NEURO: alert & oriented x 3 with fluent speech, no focal motor/sensory deficits  LABORATORY DATA:  I have reviewed the data as listed    Component Value Date/Time   NA 145 09/15/2022 0000   NA 141 11/06/2016 1102   K 4.4 09/15/2022 0000   K 3.9 11/06/2016 1102   CL 109 09/15/2022 0000   CO2 27 09/15/2022 0000   CO2 27 11/06/2016 1102   GLUCOSE 100 (H) 09/15/2022 0000   GLUCOSE 130 11/06/2016 1102   BUN 20 09/15/2022 0000   BUN 18.0 11/06/2016 1102   CREATININE 1.09 (H) 09/15/2022 0000   CREATININE 1.3 (H) 11/06/2016 1102   CALCIUM 9.6 09/15/2022 0000   CALCIUM 10.2 11/06/2016 1102  PROT 7.1 09/15/2022 0000   PROT 7.1 11/06/2016 1102   ALBUMIN 2.4 (L) 08/26/2022 1258   ALBUMIN 3.7 11/06/2016 1102   AST 48 (H) 09/15/2022 0000   AST 42 (H) 08/19/2022 0926   AST 52 (H) 11/06/2016 1102   ALT 3 (L) 09/15/2022 0000   ALT 23 08/19/2022 0926   ALT 58 (H) 11/06/2016 1102   ALKPHOS 137 (H) 08/26/2022 1258   ALKPHOS 159 (H) 11/06/2016 1102   BILITOT 0.7 09/15/2022 0000   BILITOT 0.7 08/19/2022 0926   BILITOT 0.53 11/06/2016 1102   GFRNONAA 34 (L) 09/02/2022 0500   GFRNONAA 43 (L) 08/19/2022 0926   GFRNONAA 34 (L) 02/21/2021 1414   GFRAA 40 (L) 02/21/2021 1414    No results found for: "SPEP", "UPEP"  Lab Results  Component Value Date   WBC 4.7 09/15/2022   NEUTROABS 2,247 09/15/2022   HGB 11.4 (L) 09/15/2022   HCT 35.8 09/15/2022   MCV 96.8 09/15/2022   PLT 113 (L) 09/15/2022      Chemistry      Component Value Date/Time   NA  145 09/15/2022 0000   NA 141 11/06/2016 1102   K 4.4 09/15/2022 0000   K 3.9 11/06/2016 1102   CL 109 09/15/2022 0000   CO2 27 09/15/2022 0000   CO2 27 11/06/2016 1102   BUN 20 09/15/2022 0000   BUN 18.0 11/06/2016 1102   CREATININE 1.09 (H) 09/15/2022 0000   CREATININE 1.3 (H) 11/06/2016 1102      Component Value Date/Time   CALCIUM 9.6 09/15/2022 0000   CALCIUM 10.2 11/06/2016 1102   ALKPHOS 137 (H) 08/26/2022 1258   ALKPHOS 159 (H) 11/06/2016 1102   AST 48 (H) 09/15/2022 0000   AST 42 (H) 08/19/2022 0926   AST 52 (H) 11/06/2016 1102   ALT 3 (L) 09/15/2022 0000   ALT 23 08/19/2022 0926   ALT 58 (H) 11/06/2016 1102   BILITOT 0.7 09/15/2022 0000   BILITOT 0.7 08/19/2022 0926   BILITOT 0.53 11/06/2016 1102

## 2022-10-14 NOTE — Assessment & Plan Note (Signed)
She has only received 2 doses of Taxotere, her treatment course was complicated by severe hip infection She has completed intravenous antibiotics and is currently on chronic suppressive oral antibiotics as directed by infectious disease team I recommend repeat CT imaging for staging prior to resumption of chemotherapy Due to the upcoming holidays, I recommend repeat CT imaging in 2 weeks and I will see her back afterwards and resume treatment Due to's recent severe infection, I recommend addition of G-CSF support for future treatment

## 2022-10-14 NOTE — Assessment & Plan Note (Signed)
She has bilateral lower extremity edema, right greater than the left This could be related to her recent infection; carcinomatosis causing venous obstruction cannot be excluded At this point, I do not recommend diuretic therapy as it could worsen her renal function I recommend elastic compression hose as tolerated and repeat imaging study as above

## 2022-10-14 NOTE — Assessment & Plan Note (Signed)
Her recent blood work suggested stable pancytopenia Observe closely

## 2022-10-28 ENCOUNTER — Other Ambulatory Visit: Payer: PPO

## 2022-10-28 ENCOUNTER — Inpatient Hospital Stay: Payer: PPO

## 2022-10-28 ENCOUNTER — Ambulatory Visit (HOSPITAL_COMMUNITY)
Admission: RE | Admit: 2022-10-28 | Discharge: 2022-10-28 | Disposition: A | Discharge status: Home / Self Care | Payer: PPO | Source: Ambulatory Visit | Attending: Hematology and Oncology | Admitting: Hematology and Oncology

## 2022-10-28 ENCOUNTER — Encounter (HOSPITAL_COMMUNITY): Payer: Self-pay

## 2022-10-28 DIAGNOSIS — R911 Solitary pulmonary nodule: Secondary | ICD-10-CM | POA: Insufficient documentation

## 2022-10-28 DIAGNOSIS — K5790 Diverticulosis of intestine, part unspecified, without perforation or abscess without bleeding: Secondary | ICD-10-CM | POA: Diagnosis not present

## 2022-10-28 DIAGNOSIS — J9811 Atelectasis: Secondary | ICD-10-CM | POA: Diagnosis not present

## 2022-10-28 DIAGNOSIS — R188 Other ascites: Secondary | ICD-10-CM | POA: Insufficient documentation

## 2022-10-28 DIAGNOSIS — C569 Malignant neoplasm of unspecified ovary: Secondary | ICD-10-CM

## 2022-10-28 DIAGNOSIS — Z8543 Personal history of malignant neoplasm of ovary: Secondary | ICD-10-CM | POA: Diagnosis not present

## 2022-10-28 DIAGNOSIS — Z853 Personal history of malignant neoplasm of breast: Secondary | ICD-10-CM

## 2022-10-28 DIAGNOSIS — K449 Diaphragmatic hernia without obstruction or gangrene: Secondary | ICD-10-CM | POA: Diagnosis not present

## 2022-10-28 LAB — CBC WITH DIFFERENTIAL (CANCER CENTER ONLY)
Abs Immature Granulocytes: 0.01 10*3/uL (ref 0.00–0.07)
Basophils Absolute: 0 10*3/uL (ref 0.0–0.1)
Basophils Relative: 1 %
Eosinophils Absolute: 0.1 10*3/uL (ref 0.0–0.5)
Eosinophils Relative: 2 %
HCT: 30.7 % — ABNORMAL LOW (ref 36.0–46.0)
Hemoglobin: 10.1 g/dL — ABNORMAL LOW (ref 12.0–15.0)
Immature Granulocytes: 0 %
Lymphocytes Relative: 42 %
Lymphs Abs: 1.6 10*3/uL (ref 0.7–4.0)
MCH: 32 pg (ref 26.0–34.0)
MCHC: 32.9 g/dL (ref 30.0–36.0)
MCV: 97.2 fL (ref 80.0–100.0)
Monocytes Absolute: 0.6 10*3/uL (ref 0.1–1.0)
Monocytes Relative: 15 %
Neutro Abs: 1.5 10*3/uL — ABNORMAL LOW (ref 1.7–7.7)
Neutrophils Relative %: 40 %
Platelet Count: 92 10*3/uL — ABNORMAL LOW (ref 150–400)
RBC: 3.16 MIL/uL — ABNORMAL LOW (ref 3.87–5.11)
RDW: 17 % — ABNORMAL HIGH (ref 11.5–15.5)
WBC Count: 3.8 10*3/uL — ABNORMAL LOW (ref 4.0–10.5)
nRBC: 0 % (ref 0.0–0.2)

## 2022-10-28 LAB — CMP (CANCER CENTER ONLY)
ALT: 14 U/L (ref 0–44)
AST: 42 U/L — ABNORMAL HIGH (ref 15–41)
Albumin: 3.1 g/dL — ABNORMAL LOW (ref 3.5–5.0)
Alkaline Phosphatase: 135 U/L — ABNORMAL HIGH (ref 38–126)
Anion gap: 5 (ref 5–15)
BUN: 20 mg/dL (ref 8–23)
CO2: 28 mmol/L (ref 22–32)
Calcium: 9.8 mg/dL (ref 8.9–10.3)
Chloride: 109 mmol/L (ref 98–111)
Creatinine: 1.02 mg/dL — ABNORMAL HIGH (ref 0.44–1.00)
GFR, Estimated: 54 mL/min — ABNORMAL LOW (ref >60)
Glucose, Bld: 89 mg/dL (ref 70–99)
Potassium: 3.6 mmol/L (ref 3.5–5.1)
Sodium: 142 mmol/L (ref 135–145)
Total Bilirubin: 0.8 mg/dL (ref 0.3–1.2)
Total Protein: 7.1 g/dL (ref 6.5–8.1)

## 2022-10-28 MED ORDER — HEPARIN SOD (PORK) LOCK FLUSH 100 UNIT/ML IV SOLN
500.0000 [IU] | Freq: Once | INTRAVENOUS | Status: AC
  Administered 2022-10-28: 500 [IU] via INTRAVENOUS

## 2022-10-28 MED ORDER — HEPARIN SOD (PORK) LOCK FLUSH 100 UNIT/ML IV SOLN
INTRAVENOUS | Status: AC
  Filled 2022-10-28: qty 5

## 2022-10-28 MED ORDER — SODIUM CHLORIDE (PF) 0.9 % IJ SOLN
INTRAMUSCULAR | Status: AC
  Filled 2022-10-28: qty 50

## 2022-10-28 MED ORDER — IOHEXOL 300 MG/ML  SOLN
100.0000 mL | Freq: Once | INTRAMUSCULAR | Status: AC | PRN
  Administered 2022-10-28: 100 mL via INTRAVENOUS

## 2022-10-28 MED ORDER — IOHEXOL 9 MG/ML PO SOLN
500.0000 mL | ORAL | Status: AC
  Administered 2022-10-28 (×2): 500 mL via ORAL

## 2022-10-28 MED ORDER — IOHEXOL 9 MG/ML PO SOLN
ORAL | Status: AC
  Filled 2022-10-28: qty 1000

## 2022-10-30 ENCOUNTER — Encounter: Payer: Self-pay | Admitting: Infectious Disease

## 2022-10-30 ENCOUNTER — Ambulatory Visit (INDEPENDENT_AMBULATORY_CARE_PROVIDER_SITE_OTHER): Payer: PPO | Admitting: Infectious Disease

## 2022-10-30 ENCOUNTER — Other Ambulatory Visit: Payer: Self-pay

## 2022-10-30 VITALS — BP 149/77 | HR 76 | Temp 97.7°F | Ht 62.0 in

## 2022-10-30 DIAGNOSIS — C569 Malignant neoplasm of unspecified ovary: Secondary | ICD-10-CM | POA: Diagnosis not present

## 2022-10-30 DIAGNOSIS — Z23 Encounter for immunization: Secondary | ICD-10-CM

## 2022-10-30 DIAGNOSIS — Z7185 Encounter for immunization safety counseling: Secondary | ICD-10-CM | POA: Diagnosis not present

## 2022-10-30 DIAGNOSIS — T8451XD Infection and inflammatory reaction due to internal right hip prosthesis, subsequent encounter: Secondary | ICD-10-CM

## 2022-10-30 DIAGNOSIS — D708 Other neutropenia: Secondary | ICD-10-CM | POA: Diagnosis not present

## 2022-10-30 DIAGNOSIS — Z853 Personal history of malignant neoplasm of breast: Secondary | ICD-10-CM | POA: Diagnosis not present

## 2022-10-30 DIAGNOSIS — Z96649 Presence of unspecified artificial hip joint: Secondary | ICD-10-CM

## 2022-10-30 LAB — CA 125: Cancer Antigen (CA) 125: 45.4 U/mL — ABNORMAL HIGH (ref 0.0–38.1)

## 2022-10-30 MED FILL — Dexamethasone Sodium Phosphate Inj 100 MG/10ML: INTRAMUSCULAR | Qty: 1 | Status: AC

## 2022-10-30 NOTE — Progress Notes (Signed)
Subjective:  Chief complaint: Still with left-sided hip pain when standing  Patient ID: Kayla Baker, female    DOB: February 20, 1936, 86 y.o.   MRN: [id number]  HPI  86 y.o. female with hx of HTN, hyperlipidemia, breast cancer, cephalosporin allergy and recent ovarian cancer with carcinomatosis who presented to ER w abdominal pain (her ascites) and right  hip pain with abscess found in gluteal muscles that appears to communicate with hip. She is now sp I and D and polyexchange in OR We challenged her with oral cephalexin which she tolerated and we switched her over to cefazolin to treat her prosthetic joint infection.  Ajani's pain has improved dramatically since she was in the hospital she is frustrated though that she still experiences hip pain when she sits down and also when she gets up to pivot and that she feels she is too dependent on her walker compared to when she fractured a bone more than a year ago.  She has completed her cefazolin and is now on cefadroxil.  She is seen Dr. For said she was planning G-CSF stimulating factors to elevate her neutrophil count in the context of her infection.  The patient still has left-sided hip pain which has not changed much since I last saw her but is dramatically better than when the infection.  Namely at that time she had constant pain where is now she has pain when she is changing positions specifically getting up to stand up or sitting down.  She had CT scan done for staging of her metastatic ovarian cancer and is going to review findings with Dr. Simeon Craft such tomorrow.    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[facility]) 02/29/2016   skin- 2016- squamous- on right upper arm   Chronic kidney disease    Closed displaced fracture of right femoral neck ([facility]) 11/07/2021   Deficiency anemia 11/02/2020   Diabetes  mellitus without complication ([facility])    GERD (gastroesophageal reflux disease)    Gout    takes Allopurinol daily   History of chemotherapy    History of colon polyps    benign   History of shingles    Hx of radiation therapy    Hyperlipidemia    Hypertension    Ovarian ca ([place]) dx'd 03/2020   Personal history of chemotherapy    2017   Personal history of radiation therapy    2017   Vitamin D deficiency     Past Surgical History:  Procedure Laterality Date   ABDOMINAL HYSTERECTOMY  1973   BREAST BIOPSY Left 02/26/2016   BREAST LUMPECTOMY Left 03/18/2016   CARDIAC CATHETERIZATION     patient denies this procedure   CATARACT EXTRACTION, BILATERAL  2014   right eye 2/14; left eye 3/3   COLONOSCOPY     HAND SURGERY Left 2012   HIP ARTHROPLASTY Right 11/08/2021   Procedure: ARTHROPLASTY BIPOLAR HIP (HEMIARTHROPLASTY) POSTERIOR;  Surgeon: Willaim Sheng, MD;  Location: [facility];  Service: Orthopedics;  Laterality: Right;   INCONTINENCE SURGERY  2006   IR IMAGING GUIDED PORT INSERTION  04/24/2020   IR PARACENTESIS  05/08/2020   IR PARACENTESIS  05/16/2020   IR PARACENTESIS  05/25/2020   KNEE ARTHROSCOPY Right    MASTOPEXY Right 06/16/2017   Procedure: RIGHT BREAST MASTOPEXY;  Surgeon: Irene Limbo,  MD;  Location: [facility];  Service: Plastics;  Laterality: Right;   PORT-A-CATH REMOVAL N/A 11/27/2016   Procedure: REMOVAL PORT-A-CATH;  Surgeon: Fanny Skates, MD;  Location: [facility];  Service: General;  Laterality: N/A;   PORTACATH PLACEMENT N/A 04/14/2016   Procedure: INSERTION PORT-A-CATH ;  Surgeon: Fanny Skates, MD;  Location: [facility];  Service: General;  Laterality: N/A;   RADIOACTIVE SEED GUIDED PARTIAL MASTECTOMY WITH AXILLARY SENTINEL LYMPH NODE BIOPSY Left 03/18/2016   Procedure: RADIOACTIVE SEED GUIDED LEFT PARTIAL MASTECTOMY WITH AXILLARY SENTINEL LYMPH NODE BIOPSY AND BLUE DYE INJECTION;  Surgeon: Fanny Skates, MD;  Location: [facility];  Service: General;   Laterality: Left;   REDUCTION MAMMAPLASTY Right 2018   TOTAL HIP ARTHROPLASTY Right 08/29/2022   Procedure: incision and drainage hip head exchange;  Surgeon: Willaim Sheng, MD;  Location: [facility];  Service: Orthopedics;  Laterality: Right;    Family History  Problem Relation Age of Onset   Stroke Mother 82   Other Mother        history of hysterectomy after last childbirth   Diabetes Father    Alzheimer's disease Father    Hypertension Sister    Lung cancer Sister 81       smoker   Other Sister        history of hysterectomy for fibroids   Breast cancer Sister 42   Cirrhosis Sister    Stroke Maternal Grandmother    Heart Problems Maternal Grandmother    Esophageal cancer Maternal Aunt 74       not a smoker   Parkinson's disease Maternal Aunt    Heart attack Maternal Uncle 65   Pancreatic cancer Cousin 84       paternal 1st cousin   Lung cancer Other        nephew dx. 32s; +smoker   Epilepsy Other    Other Other 12       great niece dx. benign ganglioglioma brain tumor; treated at [facility]   Epilepsy Other        no seizures in 2 years   Colon cancer Neg Hx    Stomach cancer Neg Hx    Colon polyps Neg Hx    Ulcerative colitis Neg Hx       Social History   Socioeconomic History   Marital status: Married    Spouse name: [name]   Number of children: 0   Years of education: Not on file   Highest education level: Not on file  Occupational History   Not on file  Tobacco Use   Smoking status: Never   Smokeless tobacco: Never  Vaping Use   Vaping Use: Never used  Substance and Sexual Activity   Alcohol use: No   Drug use: No   Sexual activity: Not Currently    Birth control/protection: Surgical, Post-menopausal  Other Topics Concern   Not on file  Social History Narrative   Not on file   Social Determinants of Health   Financial Resource Strain: Not on file  Food Insecurity: No Food Insecurity (08/24/2022)   Hunger Vital Sign    Worried About Running Out  of Food in the Last Year: Never true    Ran Out of Food in the Last Year: Never true  Transportation Needs: No Transportation Needs (08/24/2022)   PRAPARE - Hydrologist (Medical): No    Lack of Transportation (Non-Medical): No  Physical Activity: Not on file  Stress: Not on  file  Social Connections: Not on file    Allergies  Allergen Reactions   Mobic [Meloxicam] Swelling   Prednisone Swelling and Other (See Comments)    Can Not take High doses   Premarin [Estrogens Conjugated] Hives   Trovan [Alatrofloxacin] Other (See Comments)    Unknown reaction     Current Outpatient Medications:    acetaminophen (TYLENOL) 500 MG tablet, Take 500 mg by mouth every 6 (six) hours as needed for mild pain, headache or fever., Disp: , Rfl:    allopurinol (ZYLOPRIM) 100 MG tablet, Take 1 tablet (100 mg total) by mouth daily. FOR GOUT PREVENTION, (Patient taking differently: Take 100 mg by mouth daily.), Disp: 90 tablet, Rfl: 3   aspirin EC 81 MG tablet, Take 1 tablet (81 mg total) by mouth 2 (two) times daily. Swallow whole., Disp: 30 tablet, Rfl: 12   cefadroxil (DURICEF) 500 MG capsule, Take 2 capsules (1,000 mg total) by mouth 2 (two) times daily., Disp: 120 capsule, Rfl: 11   Cholecalciferol (VITAMIN D3) 5000 units CAPS, Take 5,000 Units by mouth daily., Disp: , Rfl:    cyanocobalamin 1000 MCG tablet, Take 1,000 mcg by mouth daily., Disp: , Rfl:    dexamethasone (DECADRON) 4 MG tablet, Take 2 tablets the morning before chemotherapy and then 2 tablets daily for 2 days after chemo, Disp: 30 tablet, Rfl: 1   Ensure Max Protein (ENSURE MAX PROTEIN) LIQD, Take 330 mLs (11 oz total) by mouth 2 (two) times daily., Disp: , Rfl:    ezetimibe (ZETIA) 10 MG tablet, TAKE 1 TABLET BY MOUTH DAILY FOR CHOLESTEROL (Patient taking differently: Take 10 mg by mouth daily.), Disp: 90 tablet, Rfl: 3   gabapentin (NEURONTIN) 300 MG capsule, Take 1 capsule 4 x  /day for Pain (Patient taking  differently: Take 300 mg by mouth 4 (four) times daily.), Disp: 360 capsule, Rfl: 3   glimepiride (AMARYL) 1 MG tablet, Take 1 tab by mouth in the morning if fasting is running 150+. Please do not take this medication if you are ill or not eating as this can cause a low blood sugar. (Patient taking differently: Take 1 mg by mouth See admin instructions. Take 1 tab by mouth daily as needed if fasting is running 150+. Please do not take this medication if you are ill or not eating as this can cause a low blood sugar.), Disp: 90 tablet, Rfl: 1   lidocaine-prilocaine (EMLA) cream, Apply 1 application. topically daily as needed (for port access)., Disp: 30 g, Rfl: 3   magnesium oxide (MAG-OX) 400 (240 Mg) MG tablet, Take 1 tablet (400 mg total) by mouth daily., Disp: 30 tablet, Rfl: 1   metoprolol succinate (TOPROL-XL) 25 MG 24 hr tablet, Take  1 tablet  Daily  for BP                                                          /                                       TAKE  BY                                 MOUTH, Disp: 90 tablet, Rfl: 3   ondansetron (ZOFRAN) 8 MG tablet, Take 1 tablet (8 mg total) by mouth every 8 (eight) hours as needed for refractory nausea / vomiting., Disp: 30 tablet, Rfl: 1   oxyCODONE (OXY IR/ROXICODONE) 5 MG immediate release tablet, Take 0.5-1 tablets (2.5-5 mg total) by mouth every 4 (four) hours as needed for severe pain or moderate pain., Disp: 15 tablet, Rfl: 0   pantoprazole (PROTONIX) 40 MG tablet, Take 1 tablet (40 mg total) by mouth daily., Disp: 30 tablet, Rfl: 1   polyethylene glycol (MIRALAX) 17 g packet, Take 17 g by mouth 2 (two) times daily. 17 grams in 6 oz of favorite drink twice a day until bowel movement.  LAXITIVE.  Restart if two days since last bowel movement (Patient taking differently: Take 17 g by mouth 2 (two) times daily.), Disp: 14 packet, Rfl: 0   rosuvastatin (CRESTOR) 5 MG tablet, TAKE 1 TABLET BY MOUTH IN THE EVENING THREE TIMES  WEEKLY FOR CHOLESTEROL (Patient taking differently: Take 5 mg by mouth See admin instructions. Monday, Wednesday and Friday), Disp: 36 tablet, Rfl: 3   trolamine salicylate (ASPERCREME) 10 % cream, Apply 1 application topically as needed for muscle pain (Use if you still feel tingles in feet in the morning after taking Gabapentin)., Disp: , Rfl:    vitamin C (ASCORBIC ACID) 500 MG tablet, Take 500 mg by mouth every evening. , Disp: , Rfl:    Review of Systems  Constitutional:  Negative for activity change, appetite change, chills, diaphoresis, fatigue, fever and unexpected weight change.  HENT:  Negative for congestion, rhinorrhea, sinus pressure, sneezing, sore throat and trouble swallowing.   Eyes:  Negative for photophobia and visual disturbance.  Respiratory:  Negative for cough, chest tightness, shortness of breath, wheezing and stridor.   Cardiovascular:  Negative for chest pain, palpitations and leg swelling.  Gastrointestinal:  Negative for abdominal distention, abdominal pain, anal bleeding, blood in stool, constipation, diarrhea, nausea and vomiting.  Genitourinary:  Negative for difficulty urinating, dysuria, flank pain and hematuria.  Musculoskeletal:  Positive for arthralgias. Negative for back pain, gait problem, joint swelling and myalgias.  Skin:  Negative for color change, pallor, rash and wound.  Neurological:  Negative for dizziness, tremors, weakness and light-headedness.  Hematological:  Negative for adenopathy. Does not bruise/bleed easily.  Psychiatric/Behavioral:  Negative for agitation, behavioral problems, confusion, decreased concentration, dysphoric mood and sleep disturbance.        Objective:   Physical Exam Constitutional:      General: She is not in acute distress.    Appearance: Normal appearance. She is well-developed. She is not ill-appearing or diaphoretic.  HENT:     Head: Normocephalic and atraumatic.     Right Ear: Hearing and external ear normal.      Left Ear: Hearing and external ear normal.     Nose: No nasal deformity or rhinorrhea.  Eyes:     General: No scleral icterus.    Conjunctiva/sclera: Conjunctivae normal.     Right eye: Right conjunctiva is not injected.     Left eye: Left conjunctiva is not injected.     Pupils: Pupils are equal, round, and reactive to light.  Neck:     Vascular: No JVD.  Cardiovascular:     Rate and  Rhythm: Normal rate and regular rhythm.     Heart sounds: S1 normal and S2 normal.  Pulmonary:     Effort: Pulmonary effort is normal. No respiratory distress.     Breath sounds: No wheezing.  Abdominal:     General: Bowel sounds are normal. There is no distension.     Palpations: Abdomen is soft.     Tenderness: There is no abdominal tenderness.  Musculoskeletal:        General: Normal range of motion.     Right shoulder: Normal.     Left shoulder: Normal.     Cervical back: Normal range of motion and neck supple.     Right hip: Normal.     Left hip: Normal.     Right knee: Normal.     Left knee: Normal.  Lymphadenopathy:     Head:     Right side of head: No submandibular, preauricular or posterior auricular adenopathy.     Left side of head: No submandibular, preauricular or posterior auricular adenopathy.     Cervical: No cervical adenopathy.     Right cervical: No superficial or deep cervical adenopathy.    Left cervical: No superficial or deep cervical adenopathy.  Skin:    General: Skin is warm and dry.     Coloration: Skin is not pale.     Findings: No abrasion, bruising, ecchymosis, erythema, lesion or rash.     Nails: There is no clubbing.  Neurological:     Mental Status: She is alert and oriented to person, place, and time.     Sensory: No sensory deficit.     Coordination: Coordination normal.     Gait: Gait normal.  Psychiatric:        Attention and Perception: She is attentive.        Mood and Affect: Mood normal.        Speech: Speech normal.        Behavior: Behavior  normal. Behavior is cooperative.        Thought Content: Thought content normal.        Judgment: Judgment normal.          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  We will continue this for a minimum of year but [place] longer if she goes on to chemotherapy.  Neutropenia she is going to be supported with G-CSF.  Ovarian cancer: Status post recent CT scan for staging which will be reviewed with Dr. [name]  Vaccine counseling recommended updated flu vaccine and COVID-19 vaccine which she was agreeable to.

## 2022-10-31 ENCOUNTER — Inpatient Hospital Stay: Payer: PPO | Attending: Gynecologic Oncology | Admitting: Hematology and Oncology

## 2022-10-31 ENCOUNTER — Encounter: Payer: Self-pay | Admitting: Hematology and Oncology

## 2022-10-31 ENCOUNTER — Inpatient Hospital Stay: Payer: PPO

## 2022-10-31 ENCOUNTER — Other Ambulatory Visit: Payer: Self-pay | Admitting: Hematology and Oncology

## 2022-10-31 VITALS — BP 143/67 | HR 90 | Temp 98.4°F | Resp 18 | Ht 62.0 in | Wt 147.8 lb

## 2022-10-31 DIAGNOSIS — Z79899 Other long term (current) drug therapy: Secondary | ICD-10-CM | POA: Insufficient documentation

## 2022-10-31 DIAGNOSIS — Z9079 Acquired absence of other genital organ(s): Secondary | ICD-10-CM | POA: Diagnosis not present

## 2022-10-31 DIAGNOSIS — D61818 Other pancytopenia: Secondary | ICD-10-CM | POA: Diagnosis not present

## 2022-10-31 DIAGNOSIS — N183 Chronic kidney disease, stage 3 unspecified: Secondary | ICD-10-CM | POA: Diagnosis not present

## 2022-10-31 DIAGNOSIS — Z5189 Encounter for other specified aftercare: Secondary | ICD-10-CM | POA: Diagnosis not present

## 2022-10-31 DIAGNOSIS — Z7984 Long term (current) use of oral hypoglycemic drugs: Secondary | ICD-10-CM | POA: Insufficient documentation

## 2022-10-31 DIAGNOSIS — Z5111 Encounter for antineoplastic chemotherapy: Secondary | ICD-10-CM | POA: Diagnosis not present

## 2022-10-31 DIAGNOSIS — Z79811 Long term (current) use of aromatase inhibitors: Secondary | ICD-10-CM | POA: Diagnosis not present

## 2022-10-31 DIAGNOSIS — C50912 Malignant neoplasm of unspecified site of left female breast: Secondary | ICD-10-CM | POA: Diagnosis not present

## 2022-10-31 DIAGNOSIS — E1122 Type 2 diabetes mellitus with diabetic chronic kidney disease: Secondary | ICD-10-CM | POA: Insufficient documentation

## 2022-10-31 DIAGNOSIS — C786 Secondary malignant neoplasm of retroperitoneum and peritoneum: Secondary | ICD-10-CM | POA: Diagnosis not present

## 2022-10-31 DIAGNOSIS — Z170 Estrogen receptor positive status [ER+]: Secondary | ICD-10-CM | POA: Insufficient documentation

## 2022-10-31 DIAGNOSIS — Z90722 Acquired absence of ovaries, bilateral: Secondary | ICD-10-CM | POA: Diagnosis not present

## 2022-10-31 DIAGNOSIS — C561 Malignant neoplasm of right ovary: Secondary | ICD-10-CM | POA: Diagnosis not present

## 2022-10-31 DIAGNOSIS — C569 Malignant neoplasm of unspecified ovary: Secondary | ICD-10-CM

## 2022-10-31 DIAGNOSIS — R600 Localized edema: Secondary | ICD-10-CM | POA: Insufficient documentation

## 2022-10-31 DIAGNOSIS — Z7982 Long term (current) use of aspirin: Secondary | ICD-10-CM | POA: Diagnosis not present

## 2022-10-31 DIAGNOSIS — Z853 Personal history of malignant neoplasm of breast: Secondary | ICD-10-CM

## 2022-10-31 MED ORDER — HEPARIN SOD (PORK) LOCK FLUSH 100 UNIT/ML IV SOLN
500.0000 [IU] | Freq: Once | INTRAVENOUS | Status: AC | PRN
  Administered 2022-10-31: 500 [IU]

## 2022-10-31 MED ORDER — SODIUM CHLORIDE 0.9% FLUSH
10.0000 mL | INTRAVENOUS | Status: DC | PRN
  Administered 2022-10-31: 10 mL

## 2022-10-31 MED ORDER — SODIUM CHLORIDE 0.9 % IV SOLN
50.0000 mg/m2 | Freq: Once | INTRAVENOUS | Status: AC
  Administered 2022-10-31: 90 mg via INTRAVENOUS
  Filled 2022-10-31: qty 9

## 2022-10-31 MED ORDER — SODIUM CHLORIDE 0.9 % IV SOLN: Freq: Once | INTRAVENOUS | Status: AC

## 2022-10-31 MED ORDER — PANTOPRAZOLE SODIUM 40 MG PO TBEC: 40.0000 mg | DELAYED_RELEASE_TABLET | Freq: Every day | ORAL | 1 refills | Status: DC

## 2022-10-31 MED ORDER — SODIUM CHLORIDE 0.9 % IV SOLN
10.0000 mg | Freq: Once | INTRAVENOUS | Status: AC
  Administered 2022-10-31: 10 mg via INTRAVENOUS
  Filled 2022-10-31: qty 10

## 2022-10-31 NOTE — Assessment & Plan Note (Signed)
She has bilateral lower extremity edema, Her blood work recently showed low albumin status and third spacing is also possibility I do not recommend diuretic therapy Observe closely

## 2022-10-31 NOTE — Assessment & Plan Note (Signed)
Renal function has improved Observe closely for now

## 2022-10-31 NOTE — Assessment & Plan Note (Signed)
Her recent blood work suggested stable pancytopenia Observe closely We will proceed with treatment without delay As long as her platelet count is above 50,000 and ANC is greater than 1, we will continue on treatment

## 2022-10-31 NOTE — Progress Notes (Signed)
[facility] OFFICE PROGRESS NOTE  Patient Care Team: [name], MD as PCP - Kinnie Scales, OD as Referring Physician (Optometry) [name], MD as Consulting Physician (Dermatology) Latanya Maudlin, MD as Consulting Physician (Orthopedic Surgery) Inda Castle, MD (Inactive) as Consulting Physician (Gastroenterology) Heath Lark, MD as Consulting Physician (Hematology and Oncology) Madelon Lips, MD as Consulting Physician (Nephrology) Carleene Mains, [facility] (Pharmacist)  ASSESSMENT & PLAN:  Ovarian cancer [facility]) She has completed IV antibiotics She will continue chronic suppressive therapy with oral antibiotics I have reviewed CT imaging with the patient and her husband which show slight disease progression but not dramatic We will resume chemotherapy with Taxotere Due to recent severe infection, I recommend addition of G-CSF support She has chronic pancytopenia, stable and we will proceed without delay I recommend repeat imaging study again in February  Bilateral lower extremity edema She has bilateral lower extremity edema, Her blood work recently showed low albumin status and third spacing is also possibility I do not recommend diuretic therapy Observe closely  Pancytopenia, acquired ([place]) Her recent blood work suggested stable pancytopenia Observe closely We will proceed with treatment without delay As long as her platelet count is above 50,000 and ANC is greater than 1, we will continue on treatment  CKD stage 3 due to type 2 diabetes mellitus ([place]) Renal function has improved Observe closely for now   Orders Placed This Encounter  Procedures   Sample to Blood Bank    Standing Status:   Standing    Number of Occurrences:   33    Standing Expiration Date:   11/01/2023    All questions were answered. The patient knows to call the clinic with any problems, questions or concerns. The total time spent in the appointment was 40 minutes  encounter with patients including review of chart and various tests results, discussions about plan of care and coordination of care plan   Heath Lark, MD 10/31/2022 10:52 AM  INTERVAL HISTORY: Please see below for problem oriented charting. she returns for treatment follow-up with her husband She noted bilateral lower extremity edema We spent majority of time reviewing test results and discussed the addition of growth factor to her future treatment  REVIEW OF SYSTEMS:   Constitutional: Denies fevers, chills or abnormal weight loss Eyes: Denies blurriness of vision Ears, nose, mouth, throat, and face: Denies mucositis or sore throat Respiratory: Denies cough, dyspnea or wheezes Cardiovascular: Denies palpitation, chest discomfort  Gastrointestinal:  Denies nausea, heartburn or change in bowel habits Skin: Denies abnormal skin rashes Lymphatics: Denies new lymphadenopathy or easy bruising Neurological:Denies numbness, tingling or new weaknesses Behavioral/Psych: Mood is stable, no new changes  All other systems were reviewed with the patient and are negative.  I have reviewed the past medical history, past surgical history, social history and family history with the patient and they are unchanged from previous note.  ALLERGIES:  is allergic to mobic [meloxicam], prednisone, premarin [estrogens conjugated], and trovan [alatrofloxacin].  MEDICATIONS:  Current Outpatient Medications  Medication Sig Dispense Refill   acetaminophen (TYLENOL) 500 MG tablet Take 500 mg by mouth every 6 (six) hours as needed for mild pain, headache or fever.     allopurinol (ZYLOPRIM) 100 MG tablet Take 1 tablet (100 mg total) by mouth daily. FOR GOUT PREVENTION, (Patient taking differently: Take 100 mg by mouth daily.) 90 tablet 3   aspirin EC 81 MG tablet Take 1 tablet (81 mg total) by mouth 2 (two) times daily. Swallow  whole. 30 tablet 12   cefadroxil (DURICEF) 500 MG capsule Take 2 capsules (1,000 mg  total) by mouth 2 (two) times daily. 120 capsule 11   Cholecalciferol (VITAMIN D3) 5000 units CAPS Take 5,000 Units by mouth daily.     cyanocobalamin 1000 MCG tablet Take 1,000 mcg by mouth daily.     dexamethasone (DECADRON) 4 MG tablet Take 2 tablets the morning before chemotherapy and then 2 tablets daily for 2 days after chemo 30 tablet 1   Ensure Max Protein (ENSURE MAX PROTEIN) LIQD Take 330 mLs (11 oz total) by mouth 2 (two) times daily.     ezetimibe (ZETIA) 10 MG tablet TAKE 1 TABLET BY MOUTH DAILY FOR CHOLESTEROL (Patient taking differently: Take 10 mg by mouth daily.) 90 tablet 3   gabapentin (NEURONTIN) 300 MG capsule Take 1 capsule 4 x  /day for Pain (Patient taking differently: Take 300 mg by mouth 4 (four) times daily.) 360 capsule 3   glimepiride (AMARYL) 1 MG tablet Take 1 tab by mouth in the morning if fasting is running 150+. Please do not take this medication if you are ill or not eating as this can cause a low blood sugar. (Patient taking differently: Take 1 mg by mouth See admin instructions. Take 1 tab by mouth daily as needed if fasting is running 150+. Please do not take this medication if you are ill or not eating as this can cause a low blood sugar.) 90 tablet 1   lidocaine-prilocaine (EMLA) cream Apply 1 application. topically daily as needed (for port access). 30 g 3   magnesium oxide (MAG-OX) 400 (240 Mg) MG tablet Take 1 tablet (400 mg total) by mouth daily. 30 tablet 1   metoprolol succinate (TOPROL-XL) 25 MG 24 hr tablet Take  1 tablet  Daily  for BP                                                          /                                       TAKE                            BY                                 MOUTH 90 tablet 3   ondansetron (ZOFRAN) 8 MG tablet Take 1 tablet (8 mg total) by mouth every 8 (eight) hours as needed for refractory nausea / vomiting. 30 tablet 1   oxyCODONE (OXY IR/ROXICODONE) 5 MG immediate release tablet Take 0.5-1 tablets (2.5-5 mg total)  by mouth every 4 (four) hours as needed for severe pain or moderate pain. 15 tablet 0   pantoprazole (PROTONIX) 40 MG tablet TAKE 1 TABLET(40 MG) BY MOUTH DAILY 90 tablet 0   polyethylene glycol (MIRALAX) 17 g packet Take 17 g by mouth 2 (two) times daily. 17 grams in 6 oz of favorite drink twice a day until bowel movement.  LAXITIVE.  Restart if two days since last bowel movement (Patient taking differently: Take  17 g by mouth 2 (two) times daily.) 14 packet 0   rosuvastatin (CRESTOR) 5 MG tablet TAKE 1 TABLET BY MOUTH IN THE EVENING THREE TIMES WEEKLY FOR CHOLESTEROL (Patient taking differently: Take 5 mg by mouth See admin instructions. Monday, Wednesday and Friday) 36 tablet 3   trolamine salicylate (ASPERCREME) 10 % cream Apply 1 application topically as needed for muscle pain (Use if you still feel tingles in feet in the morning after taking Gabapentin).     vitamin C (ASCORBIC ACID) 500 MG tablet Take 500 mg by mouth every evening.      No current facility-administered medications for this visit.    SUMMARY OF ONCOLOGIC HISTORY: Oncology History Overview Note  HRD positive Progressed and intolerant to olaparib Progressed on carboplatin, taxol and gemzar   History of left breast cancer  02/26/2016 Initial Diagnosis   Left breast biopsy 11:00 position: invasive ductal carcinoma with DCIS, ER 90%, PR 10%, HER-2 negative, Ki-67 30%, grade 2, 2.2 cm palpable lesion T2 N0 stage II a clinical stage   03/18/2016 Surgery   Left lumpectomy: Invasive ductal carcinoma, grade 2, 6.3 cm, with high-grade DCIS, margins negative, 0/4 lymph nodes negative, ER 90%, via 10%, HER-2 negative ratio 0.97, Ki-67 30%, T3 N0 stage IIB   03/25/2016 Procedure   Genetic testing is negative for pathogenic mutations within any of the 20 Genes on the breast/ovarian cancer panel   04/04/2016 Oncotype testing   Oncotype DX recurrence score 37, 25% 10 year distant risk of recurrence   04/17/2016 - 07/31/2016 Chemotherapy    Adjuvant chemotherapy with dose dense Adriamycin and Cytoxan followed by Abraxane weekly 8 ( discontinued due to neuropathy)   09/01/2016 - 09/26/2016 Radiation Therapy   Adj XRT 1) Left breast: 42.5 Gy in 17 fractions. 2) Left breast boost: 7.5 Gy in 3 fractions.   12/02/2016 -  Anti-estrogen oral therapy   Anastrozole 1 mg daily   09/05/2021 - 05/21/2022 Chemotherapy   Patient is on Treatment Plan : OVARIAN RECURRENT 3RD LINE Carboplatin D1 / Gemcitabine D1,8 (4/800) q21d     07/08/2022 - 07/29/2022 Chemotherapy   Patient is on Treatment Plan : ovarian Docetaxel q21d     07/08/2022 -  Chemotherapy   Patient is on Treatment Plan : BREAST Docetaxel (100) q21d     Ovarian cancer ([place])  04/03/2020 Imaging   US pelvis Complex cystic and solid mass in LEFT adnexa 12.5 cm diameter question cystic ovarian neoplasm; recommend correlation with serum tumor markers and further evaluation by MR imaging with and without contrast.     04/04/2020 Imaging   US venous Doppler No evidence of deep venous thrombosis in the right lower extremity. Left common femoral vein also patent.   04/15/2020 Imaging   MRI pelvis 1. Large complex solid and cystic mass arising from the right adnexa measuring 10.8 by 11.5 by 11.1 cm. This has an aggressive appearance with extensive enhancing mural soft tissue components. Findings are highly suspicious for malignant ovarian neoplasm. 2. Extensive bilateral retroperitoneal and bilateral iliac adenopathy compatible with metastatic disease. 3. Signs of extensive peritoneal carcinomatosis including ascites, enhancement, thickening and nodularity of the peritoneal reflections, omental caking and bulky peritoneal nodularity. 4. Suspected serosal involvement of the dome of bladder with loss of normal fat plane. Cannot rule out mural invasion by tumor.   04/17/2020 Tumor Marker   Patient's tumor was tested for the following markers: CA-125 Results of the tumor marker test revealed  1762.   04/20/2020 Cancer Staging  Staging form: Ovary, Fallopian Tube, and Primary Peritoneal Carcinoma, AJCC 8th Edition - Clinical: FIGO Stage IIIC (cT3, cN1, cM0) - Signed by Heath Lark, MD on 04/20/2020   04/23/2020 Imaging   1. Redemonstrated dominant mixed solid and cystic mass arising from the vicinity of the right ovary measuring at least 11.5 x 10.7 cm, not significantly changed compared to prior MR, and consistent with primary ovarian malignancy.  2. Numerous bulky retroperitoneal, bilateral iliac, and pelvic sidewall lymph nodes. 3. Moderate volume ascites throughout the abdomen and pelvis with subtle thickening and nodularity throughout the peritoneum, and extensive bulky nodular metastatic disease of the omentum. 4. Constellation of findings is consistent with advanced nodal and peritoneal metastatic disease. 5. There are prominent subcentimeter epicardial lymph nodes, nonspecific although suspicious for nodal metastatic disease. No definite nodal metastatic disease in the chest. Attention on follow-up. 6. There are multiple small subpleural nodules at the right lung base overlying the diaphragm, measuring up to 7 mm. These are generally nonspecific and less favored to represent pulmonary metastatic disease given distribution. Attention on follow-up. 7. Somewhat coarse contour of the liver, suggestive of cirrhosis, although out without overt morphologic stigmata. 8. Aortic Atherosclerosis (ICD10-I70.0).     04/24/2020 Procedure   Successful placement of a right internal jugular approach power injectable Port-A-Cath. The catheter is ready for immediate use.     05/02/2020 Tumor Marker   Patient's tumor was tested for the following markers: CA-125 Results of the tumor marker test revealed 1921   05/03/2020 - 12/12/2020 Chemotherapy   The patient had carboplatin and taxol for chemotherapy treatment.     05/08/2020 Procedure   Successful ultrasound-guided paracentesis yielding 2.6  liters of peritoneal fluid.   05/16/2020 Procedure   Successful ultrasound-guided paracentesis yielding 3.2 liters of peritoneal fluid   05/25/2020 Procedure   Successful ultrasound-guided paracentesis yielding 3 liters of peritoneal fluid.     06/01/2020 Procedure   Successful ultrasound-guided therapeutic paracentesis yielding 1.7 liters of peritoneal fluid.   06/14/2020 Tumor Marker   Patient's tumor was tested for the following markers: CA-125 Results of the tumor marker test revealed 1309   06/20/2020 Imaging   1. Dominant mixed cystic and solid mass in the central pelvis is stable in the interval. 2. Clear interval decrease in retroperitoneal and pelvic sidewall lymphadenopathy. The bulky omental disease has also clearly decreased in the interval. 3. Interval decrease in ascites. 4. Stable 8 mm subpleural nodule along the diaphragm. 5. Aortic Atherosclerosis (ICD10-I70.0).   07/10/2020 Tumor Marker   Patient's tumor was tested for the following markers: CA-125 Results of the tumor marker test revealed 220   08/03/2020 Tumor Marker   Patient's tumor was tested for the following markers: CA-125 Results of the tumor marker test revealed 53.3.   09/03/2020 Tumor Marker   Patient's tumor was tested for the following markers: CA-125 Results of the tumor marker test revealed 29.7   09/20/2020 Imaging   1. Substantial improvement. The right ovarian cystic and solid mass is half the volume that it measured on 06/20/2020. Similar reduction in the bulk of the omental caking of tumor. Prior ascites is resolved and the retroperitoneal adenopathy is markedly improved. 2. Other imaging findings of potential clinical significance: Stable pleural-based nodularity along the right hemidiaphragm measuring about 1.1 by 0.7 by 0.3 cm. Stable hypodense lesion of the right kidney upper pole, probably a cyst although the configuration of this lesion makes it difficult to obtain accurate density measurements.  Left ovarian cyst, stable. Chondrocalcinosis involving the  acetabular labra. Mild lumbar spondylosis and degenerative disc disease. 3. Aortic atherosclerosis.     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 (stage ypT3c), predominantly viable with focal fibrosis and hemosiderin laden macrophages (possible mild treatment effect)  B: Ovary and fallopian tube, right, salpingo-oophorectomy - High grade serous carcinoma involving right ovary and fallopian tube with surface involvement, size up to 8 cm  - Areas of necrosis and hemosiderin laden macrophages suggestive of treatment effect - 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 - See synoptic report and comment  C: Ovary and fallopian tube, left, salpingo-oophorectomy - High grade serous carcinoma involving the left fallopian tube, size up to 0.5 cm - Ovary with no definite involvement by carcinoma identified - Nodular area of endometriosis and peritoneal inclusion cyst also present - See synoptic report and comment   Procedure:    Bilateral salpingo-oophorectomy    Procedure:    Omentectomy    Specimen Integrity of Right Ovary:    Intact with surface involvement by tumor    Specimen Integrity of Left Ovary:    Capsule intact   TUMOR Tumor Site:    Right fallopian tube: favor as primary site; also involves right ovary and left fallopian tube  Histologic Type:    Serous carcinoma  Histologic Grade:    High grade  Tumor Size:    Greatest Dimension (Centimeters): in fallopian tube: 1.0 cm; in ovary: 8.0 cm Ovarian Surface Involvement:    Present    Laterality:    Right  Fallopian Tube Surface Involvement:    Present    Laterality:    Bilateral  Other Tissue / Organ  Involvement:    Right ovary  Other Tissue / Organ Involvement:    Right fallopian tube  Other Tissue / Organ Involvement:    Left fallopian tube  Other Tissue / Organ Involvement:    Omentum  Largest Extrapelvic Peritoneal Focus:    Macroscopic (greater than 2 cm)  Peritoneal / Ascitic Fluid:    Not submitted / unknown  Pleural Fluid:    Not submitted / unknown  Treatment Effect:    No definite or minimal response identified (chemotherapy response score [CRS] 1)   LYMPH NODES Regional Lymph Nodes:    No lymph nodes submitted or found   PATHOLOGIC STAGE CLASSIFICATION (pTNM, AJCC 8th Edition) TNM Descriptors:    y (post-treatment)  Primary Tumor (pT):    pT3c  Regional Lymph Nodes (pN):    pNX   FIGO STAGE FIGO Stage:    IIIC   ADDITIONAL FINDINGS Additional Findings:    Serous tubal intraepithelial carcinoma (STIC)  Additional Findings:    Left ovary with no definite carcinoma identified, left-sided nodule of endometriosis and peritoneal inclusion cyst    10/18/2020 - 10/20/2020 Hospital Admission   She was admitted to [facility] for interval debulking surgery   11/19/2020 Tumor Marker   Patient's tumor was tested for the following markers: CA-125. Results of the tumor marker test revealed 38.3   12/12/2020 Tumor Marker   Patient's tumor was tested for the following markers CA-125. Results of the tumor marker test revealed 42.3.   01/02/2021 Tumor Marker   Patient's tumor was tested for the following markers: CA-125. Results of the tumor marker test revealed 37.3   01/29/2021 Tumor Marker  Patient's tumor was tested for the following markers: CA-125 Results of the tumor marker test revealed 44.3   01/29/2021 Imaging   1. Interval increase in loculated appearing ascites in the low central abdomen and pelvis. 2. There is extensive peritoneal nodularity surrounding this fluid, the nodularity itself not appreciably changed in appearance compared to prior examination. 3.  Additional peritoneal nodularity and/or mesenteric lymph nodes are unchanged. 4. Interval decrease in size of a low-attenuation nodule overlying the right hemidiaphragm, now measuring 1.2 cm, previously 1.8 cm when measured similarly. Findings are consistent with treatment response of a metastatic nodule. No other evidence of intrathoracic metastatic disease. 5. Status post hysterectomy, oophorectomy, and omentectomy.   01/31/2021 - 08/26/2021 Chemotherapy   She is started on Olaparib       03/04/2021 Tumor Marker   Patient's tumor was tested for the following markers: CA-125 Results of the tumor marker test revealed 39.7   04/01/2021 Tumor Marker   Patient's tumor was tested for the following markers: CA-125 Results of the tumor marker test revealed 32.3   05/10/2021 Imaging   1. No significant change in peritoneal carcinomatosis since previous study of 3 months ago, primarily in the pelvis where there is complex fluid and peritoneal nodularity. 2. No evidence of solid visceral organ metastasis. No evidence of bowel or ureteral obstruction. 3.  Aortic Atherosclerosis (ICD10-I70.0).   06/13/2021 Tumor Marker   Patient's tumor was tested for the following markers: CA-125. Results of the tumor marker test revealed 48.3.   07/29/2021 Tumor Marker   Patient's tumor was tested for the following markers: CA-125. Results of the tumor marker test revealed 54.   08/25/2021 Imaging   1. There is extensive peritoneal soft tissue thickening and nodularity, particularly about the low right hemipelvis, which is similar to prior examination. Some small peritoneal nodules throughout the abdomen and pelvis are slightly enlarged, other nodules are unchanged. 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 2. No significant change in a subpleural nodule overlying the right hemidiaphragm. 3. Unchanged, loculated small volume pelvic ascites. 4. Status post hysterectomy,  oophorectomy, and omentectomy. 5. Coronary artery disease.   Aortic Atherosclerosis (ICD10-I70.0).     08/26/2021 Tumor Marker   Patient's tumor was tested for the following markers: CA-125. Results of the tumor marker test revealed 56.   09/04/2021 Tumor Marker   Patient's tumor was tested for the following markers: CA-125. Results of the tumor marker test revealed 55.4.   09/05/2021 - 05/21/2022 Chemotherapy   Patient is on Treatment Plan : OVARIAN RECURRENT 3RD LINE Carboplatin D1 / Gemcitabine D1,8 (4/800) q21d     10/07/2021 Tumor Marker   Patient's tumor was tested for the following markers: CA-125. Results of the tumor marker test revealed 51.9.   11/05/2021 Tumor Marker   Patient's tumor was tested for the following markers: CA-125. Results of the tumor marker test revealed 45.6.   11/27/2021 Imaging   1. Overall stable to slightly improved ill-defined soft tissue density throughout the pelvis. 2. Slight interval decrease in size of the omental lesions. 3. Stable free pelvic fluid. 4. New surgical changes from a right hip replacement. 5. New nodular and somewhat triangular density at the left lung base is likely an area of atelectasis. 6. Aortic atherosclerosis.   12/24/2021 Tumor Marker   Patient's tumor was tested for the following markers: CA-125. Results of the tumor marker test revealed 39.6.   02/04/2022 Tumor Marker   Patient's tumor was tested for the following markers:  CA-125. Results of the tumor marker test revealed 36.3.   03/05/2022 Imaging   1. Similar to mild interval increase in size of right pericolic goiter nodules and omental nodules. 2. Similar appearance of the amorphous soft tissue within the pelvis. There is decreased fluid component   03/06/2022 Tumor Marker   Patient's tumor was tested for the following markers: CA-125. Results of the tumor marker test revealed 56.2.   04/07/2022 Tumor Marker   Patient's tumor was tested for the following markers:  CA-125. Results of the tumor marker test revealed 65.5.   05/13/2022 Tumor Marker   Patient's tumor was tested for the following markers: CA-125. Results of the tumor marker test revealed 66.5.   06/12/2022 Imaging   1. Slight increase in prominence of the peritoneal and omental tumor deposits, which are most thickly banded in the lower pelvis. 2. Questionable wall thickening in the descending duodenum, cannot exclude duodenitis. 3. Other imaging findings of potential clinical significance: Bibasilar scarring. Aortic Atherosclerosis (ICD10-I70.0). Cholelithiasis.   07/08/2022 - 07/29/2022 Chemotherapy   Patient is on Treatment Plan : ovarian Docetaxel q21d     07/08/2022 -  Chemotherapy   Patient is on Treatment Plan : BREAST Docetaxel (100) q21d     07/09/2022 Tumor Marker   Patient's tumor was tested for the following markers: CA-125. Results of the tumor marker test revealed 103..   08/20/2022 Tumor Marker   Patient's tumor was tested for the following markers: CA-125. Results of the tumor marker test revealed 109.   10/30/2022 Tumor Marker   Patient's tumor was tested for the following markers: CA-125. Results of the tumor marker test revealed 45.   10/30/2022 Imaging   1. Increased size of subpleural nodule overlying the right hemidiaphragm, concerning for progressive metastatic disease. 2. Trace abdominal ascites with stable peritoneal nodularity.     PHYSICAL EXAMINATION: ECOG PERFORMANCE STATUS: 2 - Symptomatic, <50% confined to bed  Vitals:   10/31/22 1024  BP: (!) 143/67  Pulse: 90  Resp: 18  Temp: 98.4 F (36.9 C)  SpO2: 98%   Filed Weights   10/31/22 1024  Weight: 147 lb 12.8 oz (67 kg)    GENERAL:alert, no distress and comfortable HEART: She has mild bilateral lower extremity edema NEURO: alert & oriented x 3 with fluent speech, no focal motor/sensory deficits  LABORATORY DATA:  I have reviewed the data as listed    Component Value Date/Time   NA 142  10/28/2022 1100   NA 141 11/06/2016 1102   K 3.6 10/28/2022 1100   K 3.9 11/06/2016 1102   CL 109 10/28/2022 1100   CO2 28 10/28/2022 1100   CO2 27 11/06/2016 1102   GLUCOSE 89 10/28/2022 1100   GLUCOSE 130 11/06/2016 1102   BUN 20 10/28/2022 1100   BUN 18.0 11/06/2016 1102   CREATININE 1.02 (H) 10/28/2022 1100   CREATININE 1.09 (H) 09/15/2022 0000   CREATININE 1.3 (H) 11/06/2016 1102   CALCIUM 9.8 10/28/2022 1100   CALCIUM 10.2 11/06/2016 1102   PROT 7.1 10/28/2022 1100   PROT 7.1 11/06/2016 1102   ALBUMIN 3.1 (L) 10/28/2022 1100   ALBUMIN 3.7 11/06/2016 1102   AST 42 (H) 10/28/2022 1100   AST 52 (H) 11/06/2016 1102   ALT 14 10/28/2022 1100   ALT 58 (H) 11/06/2016 1102   ALKPHOS 135 (H) 10/28/2022 1100   ALKPHOS 159 (H) 11/06/2016 1102   BILITOT 0.8 10/28/2022 1100   BILITOT 0.53 11/06/2016 1102   GFRNONAA 54 (L) 10/28/2022 1100  GFRNONAA 34 (L) 02/21/2021 1414   GFRAA 40 (L) 02/21/2021 1414    No results found for: "SPEP", "UPEP"  Lab Results  Component Value Date   WBC 3.8 (L) 10/28/2022   NEUTROABS 1.5 (L) 10/28/2022   HGB 10.1 (L) 10/28/2022   HCT 30.7 (L) 10/28/2022   MCV 97.2 10/28/2022   PLT 92 (L) 10/28/2022      Chemistry      Component Value Date/Time   NA 142 10/28/2022 1100   NA 141 11/06/2016 1102   K 3.6 10/28/2022 1100   K 3.9 11/06/2016 1102   CL 109 10/28/2022 1100   CO2 28 10/28/2022 1100   CO2 27 11/06/2016 1102   BUN 20 10/28/2022 1100   BUN 18.0 11/06/2016 1102   CREATININE 1.02 (H) 10/28/2022 1100   CREATININE 1.09 (H) 09/15/2022 0000   CREATININE 1.3 (H) 11/06/2016 1102      Component Value Date/Time   CALCIUM 9.8 10/28/2022 1100   CALCIUM 10.2 11/06/2016 1102   ALKPHOS 135 (H) 10/28/2022 1100   ALKPHOS 159 (H) 11/06/2016 1102   AST 42 (H) 10/28/2022 1100   AST 52 (H) 11/06/2016 1102   ALT 14 10/28/2022 1100   ALT 58 (H) 11/06/2016 1102   BILITOT 0.8 10/28/2022 1100   BILITOT 0.53 11/06/2016 1102       RADIOGRAPHIC  STUDIES: I have reviewed multiple imaging studies with the patient I have personally reviewed the radiological images as listed and agreed with the findings in the report. CT CHEST ABDOMEN PELVIS W CONTRAST  Result Date: 10/29/2022 CLINICAL DATA:  Ovarian cancer; * Tracking Code: BO * EXAM: CT CHEST, ABDOMEN, AND PELVIS WITH CONTRAST TECHNIQUE: Multidetector CT imaging of the chest, abdomen and pelvis was performed following the standard protocol during bolus administration of intravenous contrast. RADIATION DOSE REDUCTION: This exam was performed according to the departmental dose-optimization program which includes automated exposure control, adjustment of the mA and/or kV according to patient size and/or use of iterative reconstruction technique. CONTRAST:  123m OMNIPAQUE IOHEXOL 300 MG/ML  SOLN COMPARISON:  CT abdomen and pelvis dated August 24, 2022; CT chest, abdomen and pelvis dated August 23, 2021 FINDINGS: CT CHEST FINDINGS Cardiovascular: Normal heart size. No pericardial effusion. Normal caliber thoracic aorta with moderate atherosclerotic disease. No coronary artery calcifications. Mediastinum/Nodes: Small hiatal hernia. Thyroid is unremarkable. No pathologically enlarged lymph nodes seen in the chest. Lungs/Pleura: Central airways are patent. Mild bibasilar atelectasis. No consolidation, pleural effusion or pneumothorax. Subpleural nodule overlying the right hemidiaphragm is increased in size measuring 2.0 x 1.3 cm on series 6, image 100, previously measured up to 3 mm. Musculoskeletal: Stable sclerotic lesion of the left scapula, likely a bone island. No aggressive appearing osseous lesions seen in the region of the chest. CT ABDOMEN PELVIS FINDINGS Hepatobiliary: No focal liver abnormality is seen. No gallstones, gallbladder wall thickening, or biliary dilatation. Pancreas: Unremarkable. No pancreatic ductal dilatation or surrounding inflammatory changes. Spleen: Stable splenomegaly  Adrenals/Urinary Tract: Bilateral adrenal glands are unremarkable. No hydronephrosis or nephrolithiasis. No suspicious renal lesions. Bladder is unremarkable. Stomach/Bowel: Stomach is within normal limits. Diverticulosis. No evidence of bowel wall thickening, distention, or inflammatory changes. Vascular/Lymphatic: No significant vascular findings are present. No enlarged abdominal or pelvic lymph nodes. Reproductive: No adnexal masses. Other: Trace abdominal ascites and peritoneal nodularity reference peritoneal nodule of the right hemiabdomen measuring 9 mm on series 2, image 77, unchanged. Stable pelvic soft tissue nodule measuring 2.0 x 1.8 cm on series 4, image 65, unchanged  when compared with prior and remeasured in similar plane. Musculoskeletal: Prior total right hip replacement. Previously described fluid collection of the right gluteal muscles is longer visualized. No aggressive appearing osseous lesions. IMPRESSION: 1. Increased size of subpleural nodule overlying the right hemidiaphragm, concerning for progressive metastatic disease. 2. Trace abdominal ascites with stable peritoneal nodularity. Electronically Signed   By: Yetta Glassman M.D.   On: 10/29/2022 13:32

## 2022-10-31 NOTE — Patient Instructions (Addendum)
[facility]  Discharge Instructions: Thank you for choosing [facility] to provide your oncology and hematology care.   If you have a lab appointment with the [facility], please go directly to the [facility] and check in at the registration area.   Wear comfortable clothing and clothing appropriate for easy access to any Portacath or PICC line.   We strive to give you quality time with your provider. You may need to reschedule your appointment if you arrive late (15 or more minutes).  Arriving late affects you and other patients whose appointments are after yours.  Also, if you miss three or more appointments without notifying the office, you may be dismissed from the clinic at the provider's discretion.      For prescription refill requests, have your pharmacy contact our office and allow 72 hours for refills to be completed.    Today you received the following chemotherapy and/or immunotherapy agents Docetaxel (Taxotere )     To help prevent nausea and vomiting after your treatment, we encourage you to take your nausea medication as directed.  BELOW ARE SYMPTOMS THAT SHOULD BE REPORTED IMMEDIATELY: *FEVER GREATER THAN 100.4 F (38 C) OR HIGHER *CHILLS OR SWEATING *NAUSEA AND VOMITING THAT IS NOT CONTROLLED WITH YOUR NAUSEA MEDICATION *UNUSUAL SHORTNESS OF BREATH *UNUSUAL BRUISING OR BLEEDING *URINARY PROBLEMS (pain or burning when urinating, or frequent urination) *BOWEL PROBLEMS (unusual diarrhea, constipation, pain near the anus) TENDERNESS IN MOUTH AND THROAT WITH OR WITHOUT PRESENCE OF ULCERS (sore throat, sores in mouth, or a toothache) UNUSUAL RASH, SWELLING OR PAIN  UNUSUAL VAGINAL DISCHARGE OR ITCHING   Items with * indicate a potential emergency and should be followed up as soon as possible or go to the Emergency Department if any problems should occur.  Please show the CHEMOTHERAPY ALERT CARD or IMMUNOTHERAPY ALERT CARD at  check-in to the Emergency Department and triage nurse.  Should you have questions after your visit or need to cancel or reschedule your appointment, please contact [facility]  Dept: [phone]  and follow the prompts.  Office hours are 8:00 a.m. to 4:30 p.m. Monday - Friday. Please note that voicemails left after 4:00 p.m. may not be returned until the following business day.  We are closed weekends and major holidays. You have access to a nurse at all times for urgent questions. Please call the main number to the clinic Dept: [phone] and follow the prompts.   For any non-urgent questions, you may also contact your provider using MyChart. We now offer e-Visits for anyone 14 and older to request care online for non-urgent symptoms. For details visit [email or web address].   Also download the MyChart app! Go to the app store, search "MyChart", open the app, select [facility], and log in with your MyChart username and password.  Masks are optional in the cancer centers. If you would like for your care team to wear a mask while they are taking care of you, please let them know. You may have one support person who is at least 84 years old accompany you for your appointments.

## 2022-10-31 NOTE — Assessment & Plan Note (Signed)
She has completed IV antibiotics She will continue chronic suppressive therapy with oral antibiotics I have reviewed CT imaging with the patient and her husband which show slight disease progression but not dramatic We will resume chemotherapy with Taxotere Due to recent severe infection, I recommend addition of G-CSF support She has chronic pancytopenia, stable and we will proceed without delay I recommend repeat imaging study again in February

## 2022-11-01 ENCOUNTER — Other Ambulatory Visit: Payer: Self-pay | Admitting: Nurse Practitioner

## 2022-11-03 ENCOUNTER — Other Ambulatory Visit: Payer: Self-pay

## 2022-11-03 ENCOUNTER — Inpatient Hospital Stay: Payer: PPO

## 2022-11-03 VITALS — BP 156/77 | HR 68 | Temp 98.0°F | Resp 16

## 2022-11-03 DIAGNOSIS — D709 Neutropenia, unspecified: Secondary | ICD-10-CM | POA: Diagnosis not present

## 2022-11-03 DIAGNOSIS — G9341 Metabolic encephalopathy: Secondary | ICD-10-CM | POA: Diagnosis not present

## 2022-11-03 DIAGNOSIS — K6389 Other specified diseases of intestine: Secondary | ICD-10-CM | POA: Diagnosis not present

## 2022-11-03 DIAGNOSIS — C561 Malignant neoplasm of right ovary: Secondary | ICD-10-CM | POA: Diagnosis not present

## 2022-11-03 DIAGNOSIS — Z7982 Long term (current) use of aspirin: Secondary | ICD-10-CM | POA: Diagnosis not present

## 2022-11-03 DIAGNOSIS — Z96649 Presence of unspecified artificial hip joint: Secondary | ICD-10-CM | POA: Diagnosis not present

## 2022-11-03 DIAGNOSIS — G620 Drug-induced polyneuropathy: Secondary | ICD-10-CM | POA: Diagnosis not present

## 2022-11-03 DIAGNOSIS — E785 Hyperlipidemia, unspecified: Secondary | ICD-10-CM | POA: Diagnosis not present

## 2022-11-03 DIAGNOSIS — R6520 Severe sepsis without septic shock: Secondary | ICD-10-CM | POA: Diagnosis not present

## 2022-11-03 DIAGNOSIS — Z853 Personal history of malignant neoplasm of breast: Secondary | ICD-10-CM

## 2022-11-03 DIAGNOSIS — Z9079 Acquired absence of other genital organ(s): Secondary | ICD-10-CM | POA: Diagnosis not present

## 2022-11-03 DIAGNOSIS — K5150 Left sided colitis without complications: Secondary | ICD-10-CM | POA: Diagnosis not present

## 2022-11-03 DIAGNOSIS — Z79811 Long term (current) use of aromatase inhibitors: Secondary | ICD-10-CM | POA: Diagnosis not present

## 2022-11-03 DIAGNOSIS — C569 Malignant neoplasm of unspecified ovary: Secondary | ICD-10-CM

## 2022-11-03 DIAGNOSIS — Y831 Surgical operation with implant of artificial internal device as the cause of abnormal reaction of the patient, or of later complication, without mention of misadventure at the time of the procedure: Secondary | ICD-10-CM | POA: Diagnosis present

## 2022-11-03 DIAGNOSIS — R180 Malignant ascites: Secondary | ICD-10-CM | POA: Diagnosis not present

## 2022-11-03 DIAGNOSIS — R519 Headache, unspecified: Secondary | ICD-10-CM | POA: Diagnosis not present

## 2022-11-03 DIAGNOSIS — Z8249 Family history of ischemic heart disease and other diseases of the circulatory system: Secondary | ICD-10-CM | POA: Diagnosis not present

## 2022-11-03 DIAGNOSIS — Z515 Encounter for palliative care: Secondary | ICD-10-CM | POA: Diagnosis not present

## 2022-11-03 DIAGNOSIS — N179 Acute kidney failure, unspecified: Secondary | ICD-10-CM | POA: Diagnosis not present

## 2022-11-03 DIAGNOSIS — N281 Cyst of kidney, acquired: Secondary | ICD-10-CM | POA: Diagnosis not present

## 2022-11-03 DIAGNOSIS — C786 Secondary malignant neoplasm of retroperitoneum and peritoneum: Secondary | ICD-10-CM | POA: Diagnosis not present

## 2022-11-03 DIAGNOSIS — N183 Chronic kidney disease, stage 3 unspecified: Secondary | ICD-10-CM | POA: Diagnosis not present

## 2022-11-03 DIAGNOSIS — R140 Abdominal distension (gaseous): Secondary | ICD-10-CM | POA: Diagnosis present

## 2022-11-03 DIAGNOSIS — Z7984 Long term (current) use of oral hypoglycemic drugs: Secondary | ICD-10-CM | POA: Diagnosis not present

## 2022-11-03 DIAGNOSIS — R5081 Fever presenting with conditions classified elsewhere: Secondary | ICD-10-CM | POA: Diagnosis not present

## 2022-11-03 DIAGNOSIS — T8459XD Infection and inflammatory reaction due to other internal joint prosthesis, subsequent encounter: Secondary | ICD-10-CM | POA: Diagnosis not present

## 2022-11-03 DIAGNOSIS — Z170 Estrogen receptor positive status [ER+]: Secondary | ICD-10-CM | POA: Diagnosis not present

## 2022-11-03 DIAGNOSIS — D630 Anemia in neoplastic disease: Secondary | ICD-10-CM | POA: Diagnosis not present

## 2022-11-03 DIAGNOSIS — K529 Noninfective gastroenteritis and colitis, unspecified: Secondary | ICD-10-CM | POA: Diagnosis not present

## 2022-11-03 DIAGNOSIS — D61818 Other pancytopenia: Secondary | ICD-10-CM | POA: Diagnosis not present

## 2022-11-03 DIAGNOSIS — E1122 Type 2 diabetes mellitus with diabetic chronic kidney disease: Secondary | ICD-10-CM | POA: Diagnosis not present

## 2022-11-03 DIAGNOSIS — I129 Hypertensive chronic kidney disease with stage 1 through stage 4 chronic kidney disease, or unspecified chronic kidney disease: Secondary | ICD-10-CM | POA: Diagnosis not present

## 2022-11-03 DIAGNOSIS — Z7189 Other specified counseling: Secondary | ICD-10-CM | POA: Diagnosis not present

## 2022-11-03 DIAGNOSIS — A419 Sepsis, unspecified organism: Secondary | ICD-10-CM | POA: Diagnosis not present

## 2022-11-03 DIAGNOSIS — Z8619 Personal history of other infectious and parasitic diseases: Secondary | ICD-10-CM | POA: Diagnosis not present

## 2022-11-03 DIAGNOSIS — R188 Other ascites: Secondary | ICD-10-CM | POA: Diagnosis not present

## 2022-11-03 DIAGNOSIS — I10 Essential (primary) hypertension: Secondary | ICD-10-CM | POA: Diagnosis not present

## 2022-11-03 DIAGNOSIS — R600 Localized edema: Secondary | ICD-10-CM | POA: Diagnosis not present

## 2022-11-03 DIAGNOSIS — C50912 Malignant neoplasm of unspecified site of left female breast: Secondary | ICD-10-CM | POA: Diagnosis not present

## 2022-11-03 DIAGNOSIS — Z79899 Other long term (current) drug therapy: Secondary | ICD-10-CM | POA: Diagnosis not present

## 2022-11-03 DIAGNOSIS — R4589 Other symptoms and signs involving emotional state: Secondary | ICD-10-CM | POA: Diagnosis not present

## 2022-11-03 DIAGNOSIS — Z5189 Encounter for other specified aftercare: Secondary | ICD-10-CM | POA: Diagnosis not present

## 2022-11-03 DIAGNOSIS — T8451XA Infection and inflammatory reaction due to internal right hip prosthesis, initial encounter: Secondary | ICD-10-CM | POA: Diagnosis not present

## 2022-11-03 DIAGNOSIS — Z5111 Encounter for antineoplastic chemotherapy: Secondary | ICD-10-CM | POA: Diagnosis not present

## 2022-11-03 DIAGNOSIS — T451X5A Adverse effect of antineoplastic and immunosuppressive drugs, initial encounter: Secondary | ICD-10-CM | POA: Diagnosis not present

## 2022-11-03 DIAGNOSIS — K219 Gastro-esophageal reflux disease without esophagitis: Secondary | ICD-10-CM | POA: Diagnosis not present

## 2022-11-03 DIAGNOSIS — Z90722 Acquired absence of ovaries, bilateral: Secondary | ICD-10-CM | POA: Diagnosis not present

## 2022-11-03 MED ORDER — PEGFILGRASTIM-CBQV 6 MG/0.6ML ~~LOC~~ SOSY
6.0000 mg | PREFILLED_SYRINGE | Freq: Once | SUBCUTANEOUS | Status: AC
  Administered 2022-11-03: 6 mg via SUBCUTANEOUS
  Filled 2022-11-03: qty 0.6

## 2022-11-03 NOTE — Patient Instructions (Signed)

## 2022-11-04 DIAGNOSIS — T8459XD Infection and inflammatory reaction due to other internal joint prosthesis, subsequent encounter: Secondary | ICD-10-CM | POA: Diagnosis not present

## 2022-11-06 ENCOUNTER — Emergency Department (HOSPITAL_COMMUNITY): Payer: PPO

## 2022-11-06 ENCOUNTER — Other Ambulatory Visit: Payer: Self-pay | Admitting: Nurse Practitioner

## 2022-11-06 ENCOUNTER — Other Ambulatory Visit: Payer: Self-pay

## 2022-11-06 ENCOUNTER — Telehealth: Payer: Self-pay

## 2022-11-06 ENCOUNTER — Inpatient Hospital Stay (HOSPITAL_COMMUNITY)
Admission: EM | Admit: 2022-11-06 | Discharge: 2022-11-10 | DRG: 871 | Disposition: A | Discharge status: Home / Self Care | Payer: PPO | Attending: Internal Medicine | Admitting: Internal Medicine

## 2022-11-06 ENCOUNTER — Telehealth: Payer: Self-pay | Admitting: Nurse Practitioner

## 2022-11-06 ENCOUNTER — Encounter (HOSPITAL_COMMUNITY): Payer: Self-pay | Admitting: Internal Medicine

## 2022-11-06 DIAGNOSIS — T8451XA Infection and inflammatory reaction due to internal right hip prosthesis, initial encounter: Secondary | ICD-10-CM | POA: Diagnosis present

## 2022-11-06 DIAGNOSIS — E785 Hyperlipidemia, unspecified: Secondary | ICD-10-CM | POA: Diagnosis present

## 2022-11-06 DIAGNOSIS — D630 Anemia in neoplastic disease: Secondary | ICD-10-CM | POA: Diagnosis present

## 2022-11-06 DIAGNOSIS — R600 Localized edema: Secondary | ICD-10-CM | POA: Diagnosis present

## 2022-11-06 DIAGNOSIS — R188 Other ascites: Secondary | ICD-10-CM | POA: Diagnosis not present

## 2022-11-06 DIAGNOSIS — Z923 Personal history of irradiation: Secondary | ICD-10-CM

## 2022-11-06 DIAGNOSIS — G9341 Metabolic encephalopathy: Secondary | ICD-10-CM | POA: Diagnosis present

## 2022-11-06 DIAGNOSIS — E1122 Type 2 diabetes mellitus with diabetic chronic kidney disease: Secondary | ICD-10-CM | POA: Diagnosis present

## 2022-11-06 DIAGNOSIS — Z96649 Presence of unspecified artificial hip joint: Secondary | ICD-10-CM | POA: Diagnosis not present

## 2022-11-06 DIAGNOSIS — Z515 Encounter for palliative care: Secondary | ICD-10-CM | POA: Diagnosis not present

## 2022-11-06 DIAGNOSIS — T451X5A Adverse effect of antineoplastic and immunosuppressive drugs, initial encounter: Secondary | ICD-10-CM | POA: Diagnosis present

## 2022-11-06 DIAGNOSIS — Z8249 Family history of ischemic heart disease and other diseases of the circulatory system: Secondary | ICD-10-CM

## 2022-11-06 DIAGNOSIS — R180 Malignant ascites: Secondary | ICD-10-CM | POA: Diagnosis present

## 2022-11-06 DIAGNOSIS — A419 Sepsis, unspecified organism: Principal | ICD-10-CM | POA: Diagnosis present

## 2022-11-06 DIAGNOSIS — C569 Malignant neoplasm of unspecified ovary: Secondary | ICD-10-CM | POA: Diagnosis present

## 2022-11-06 DIAGNOSIS — I129 Hypertensive chronic kidney disease with stage 1 through stage 4 chronic kidney disease, or unspecified chronic kidney disease: Secondary | ICD-10-CM | POA: Diagnosis present

## 2022-11-06 DIAGNOSIS — Z90710 Acquired absence of both cervix and uterus: Secondary | ICD-10-CM

## 2022-11-06 DIAGNOSIS — M1990 Unspecified osteoarthritis, unspecified site: Secondary | ICD-10-CM | POA: Diagnosis present

## 2022-11-06 DIAGNOSIS — Z9221 Personal history of antineoplastic chemotherapy: Secondary | ICD-10-CM

## 2022-11-06 DIAGNOSIS — Z7952 Long term (current) use of systemic steroids: Secondary | ICD-10-CM

## 2022-11-06 DIAGNOSIS — I10 Essential (primary) hypertension: Secondary | ICD-10-CM | POA: Diagnosis not present

## 2022-11-06 DIAGNOSIS — Z886 Allergy status to analgesic agent status: Secondary | ICD-10-CM

## 2022-11-06 DIAGNOSIS — R140 Abdominal distension (gaseous): Secondary | ICD-10-CM | POA: Diagnosis present

## 2022-11-06 DIAGNOSIS — Y831 Surgical operation with implant of artificial internal device as the cause of abnormal reaction of the patient, or of later complication, without mention of misadventure at the time of the procedure: Secondary | ICD-10-CM | POA: Diagnosis present

## 2022-11-06 DIAGNOSIS — Z792 Long term (current) use of antibiotics: Secondary | ICD-10-CM

## 2022-11-06 DIAGNOSIS — K529 Noninfective gastroenteritis and colitis, unspecified: Principal | ICD-10-CM

## 2022-11-06 DIAGNOSIS — K219 Gastro-esophageal reflux disease without esophagitis: Secondary | ICD-10-CM | POA: Diagnosis present

## 2022-11-06 DIAGNOSIS — Z7189 Other specified counseling: Secondary | ICD-10-CM | POA: Diagnosis not present

## 2022-11-06 DIAGNOSIS — G620 Drug-induced polyneuropathy: Secondary | ICD-10-CM | POA: Diagnosis present

## 2022-11-06 DIAGNOSIS — Z8619 Personal history of other infectious and parasitic diseases: Secondary | ICD-10-CM | POA: Diagnosis not present

## 2022-11-06 DIAGNOSIS — T8459XA Infection and inflammatory reaction due to other internal joint prosthesis, initial encounter: Secondary | ICD-10-CM

## 2022-11-06 DIAGNOSIS — R5381 Other malaise: Secondary | ICD-10-CM | POA: Diagnosis present

## 2022-11-06 DIAGNOSIS — Z86010 Personal history of colonic polyps: Secondary | ICD-10-CM

## 2022-11-06 DIAGNOSIS — K5150 Left sided colitis without complications: Secondary | ICD-10-CM | POA: Diagnosis present

## 2022-11-06 DIAGNOSIS — R6520 Severe sepsis without septic shock: Secondary | ICD-10-CM | POA: Diagnosis not present

## 2022-11-06 DIAGNOSIS — D709 Neutropenia, unspecified: Secondary | ICD-10-CM | POA: Diagnosis present

## 2022-11-06 DIAGNOSIS — Z9842 Cataract extraction status, left eye: Secondary | ICD-10-CM

## 2022-11-06 DIAGNOSIS — R4589 Other symptoms and signs involving emotional state: Secondary | ICD-10-CM

## 2022-11-06 DIAGNOSIS — Z79899 Other long term (current) drug therapy: Secondary | ICD-10-CM | POA: Diagnosis not present

## 2022-11-06 DIAGNOSIS — Z803 Family history of malignant neoplasm of breast: Secondary | ICD-10-CM

## 2022-11-06 DIAGNOSIS — Z7984 Long term (current) use of oral hypoglycemic drugs: Secondary | ICD-10-CM

## 2022-11-06 DIAGNOSIS — Z9841 Cataract extraction status, right eye: Secondary | ICD-10-CM

## 2022-11-06 DIAGNOSIS — Z888 Allergy status to other drugs, medicaments and biological substances status: Secondary | ICD-10-CM

## 2022-11-06 DIAGNOSIS — Z9012 Acquired absence of left breast and nipple: Secondary | ICD-10-CM

## 2022-11-06 DIAGNOSIS — Z833 Family history of diabetes mellitus: Secondary | ICD-10-CM

## 2022-11-06 DIAGNOSIS — N183 Chronic kidney disease, stage 3 unspecified: Secondary | ICD-10-CM | POA: Diagnosis present

## 2022-11-06 DIAGNOSIS — N179 Acute kidney failure, unspecified: Secondary | ICD-10-CM | POA: Diagnosis present

## 2022-11-06 DIAGNOSIS — R5081 Fever presenting with conditions classified elsewhere: Secondary | ICD-10-CM | POA: Diagnosis present

## 2022-11-06 DIAGNOSIS — Z881 Allergy status to other antibiotic agents status: Secondary | ICD-10-CM

## 2022-11-06 DIAGNOSIS — D61818 Other pancytopenia: Secondary | ICD-10-CM | POA: Diagnosis present

## 2022-11-06 DIAGNOSIS — Z7982 Long term (current) use of aspirin: Secondary | ICD-10-CM

## 2022-11-06 DIAGNOSIS — T8459XD Infection and inflammatory reaction due to other internal joint prosthesis, subsequent encounter: Secondary | ICD-10-CM | POA: Diagnosis not present

## 2022-11-06 DIAGNOSIS — Z853 Personal history of malignant neoplasm of breast: Secondary | ICD-10-CM

## 2022-11-06 DIAGNOSIS — M109 Gout, unspecified: Secondary | ICD-10-CM | POA: Diagnosis present

## 2022-11-06 LAB — PROTIME-INR
INR: 1.3 — ABNORMAL HIGH (ref 0.8–1.2)
Prothrombin Time: 16.2 seconds — ABNORMAL HIGH (ref 11.4–15.2)

## 2022-11-06 LAB — DIFFERENTIAL
Abs Immature Granulocytes: 0 10*3/uL (ref 0.00–0.07)
Band Neutrophils: 0 %
Basophils Absolute: 0 10*3/uL (ref 0.0–0.1)
Basophils Relative: 0 %
Blasts: 0 %
Eosinophils Absolute: 0 10*3/uL (ref 0.0–0.5)
Eosinophils Relative: 3 %
Lymphocytes Relative: 66 %
Lymphs Abs: 0.5 10*3/uL — ABNORMAL LOW (ref 0.7–4.0)
Metamyelocytes Relative: 0 %
Monocytes Absolute: 0.1 10*3/uL (ref 0.1–1.0)
Monocytes Relative: 15 %
Myelocytes: 0 %
Neutro Abs: 0.1 10*3/uL — CL (ref 1.7–7.7)
Neutrophils Relative %: 16 %
Other: 0 %
Promyelocytes Relative: 0 %
nRBC: 0 /100 WBC

## 2022-11-06 LAB — LACTIC ACID, PLASMA
Lactic Acid, Venous: 2.3 mmol/L (ref 0.5–1.9)
Lactic Acid, Venous: 2.3 mmol/L (ref 0.5–1.9)

## 2022-11-06 LAB — COMPREHENSIVE METABOLIC PANEL
ALT: 24 U/L (ref 0–44)
AST: 34 U/L (ref 15–41)
Albumin: 2.6 g/dL — ABNORMAL LOW (ref 3.5–5.0)
Alkaline Phosphatase: 96 U/L (ref 38–126)
Anion gap: 6 (ref 5–15)
BUN: 32 mg/dL — ABNORMAL HIGH (ref 8–23)
CO2: 25 mmol/L (ref 22–32)
Calcium: 8.7 mg/dL — ABNORMAL LOW (ref 8.9–10.3)
Chloride: 108 mmol/L (ref 98–111)
Creatinine, Ser: 1.01 mg/dL — ABNORMAL HIGH (ref 0.44–1.00)
GFR, Estimated: 55 mL/min — ABNORMAL LOW (ref >60)
Glucose, Bld: 136 mg/dL — ABNORMAL HIGH (ref 70–99)
Potassium: 3.2 mmol/L — ABNORMAL LOW (ref 3.5–5.1)
Sodium: 139 mmol/L (ref 135–145)
Total Bilirubin: 1.2 mg/dL (ref 0.3–1.2)
Total Protein: 5.9 g/dL — ABNORMAL LOW (ref 6.5–8.1)

## 2022-11-06 LAB — URINALYSIS, ROUTINE W REFLEX MICROSCOPIC
Bilirubin Urine: NEGATIVE
Glucose, UA: NEGATIVE mg/dL
Hgb urine dipstick: NEGATIVE
Ketones, ur: NEGATIVE mg/dL
Leukocytes,Ua: NEGATIVE
Nitrite: NEGATIVE
Protein, ur: NEGATIVE mg/dL
Specific Gravity, Urine: >1.046 — ABNORMAL HIGH (ref 1.005–1.030)
pH: 5 (ref 5.0–8.0)

## 2022-11-06 LAB — CBC
HCT: 30.1 % — ABNORMAL LOW (ref 36.0–46.0)
Hemoglobin: 9.6 g/dL — ABNORMAL LOW (ref 12.0–15.0)
MCH: 31.4 pg (ref 26.0–34.0)
MCHC: 31.9 g/dL (ref 30.0–36.0)
MCV: 98.4 fL (ref 80.0–100.0)
Platelets: 54 10*3/uL — ABNORMAL LOW (ref 150–400)
RBC: 3.06 MIL/uL — ABNORMAL LOW (ref 3.87–5.11)
RDW: 16.8 % — ABNORMAL HIGH (ref 11.5–15.5)
WBC: 0.7 10*3/uL — CL (ref 4.0–10.5)
nRBC: 0 % (ref 0.0–0.2)

## 2022-11-06 LAB — MAGNESIUM: Magnesium: 1.3 mg/dL — ABNORMAL LOW (ref 1.7–2.4)

## 2022-11-06 LAB — VITAMIN B12: Vitamin B-12: 3138 pg/mL — ABNORMAL HIGH (ref 180–914)

## 2022-11-06 LAB — AMMONIA: Ammonia: 29 umol/L (ref 9–35)

## 2022-11-06 LAB — TSH: TSH: 1.423 u[IU]/mL (ref 0.350–4.500)

## 2022-11-06 LAB — FOLATE: Folate: 5.3 ng/mL — ABNORMAL LOW (ref >5.9)

## 2022-11-06 LAB — PHOSPHORUS: Phosphorus: 1.7 mg/dL — ABNORMAL LOW (ref 2.5–4.6)

## 2022-11-06 LAB — LIPASE, BLOOD: Lipase: 29 U/L (ref 11–51)

## 2022-11-06 MED ORDER — VANCOMYCIN HCL 750 MG/150ML IV SOLN
750.0000 mg | INTRAVENOUS | Status: DC
  Administered 2022-11-07: 750 mg via INTRAVENOUS
  Filled 2022-11-06: qty 150

## 2022-11-06 MED ORDER — ONDANSETRON HCL 4 MG/2ML IJ SOLN: 4.0000 mg | Freq: Four times a day (QID) | INTRAMUSCULAR | Status: DC | PRN

## 2022-11-06 MED ORDER — ENSURE MAX PROTEIN PO LIQD
11.0000 [oz_av] | Freq: Two times a day (BID) | ORAL | Status: DC
  Administered 2022-11-07 – 2022-11-10 (×3): 11 [oz_av] via ORAL
  Filled 2022-11-06 (×8): qty 330

## 2022-11-06 MED ORDER — SODIUM CHLORIDE 0.9 % IV SOLN: 2.0000 g | Freq: Once | INTRAVENOUS | Status: DC

## 2022-11-06 MED ORDER — METOPROLOL SUCCINATE ER 25 MG PO TB24
25.0000 mg | ORAL_TABLET | Freq: Every day | ORAL | Status: DC
  Administered 2022-11-06 – 2022-11-10 (×5): 25 mg via ORAL
  Filled 2022-11-06 (×5): qty 1

## 2022-11-06 MED ORDER — FREESTYLE LANCETS MISC: 3 refills | Status: DC

## 2022-11-06 MED ORDER — GABAPENTIN 300 MG PO CAPS
300.0000 mg | ORAL_CAPSULE | Freq: Four times a day (QID) | ORAL | Status: DC
  Administered 2022-11-06 – 2022-11-10 (×14): 300 mg via ORAL
  Filled 2022-11-06 (×14): qty 1

## 2022-11-06 MED ORDER — INSULIN ASPART 100 UNIT/ML IJ SOLN
0.0000 [IU] | Freq: Three times a day (TID) | INTRAMUSCULAR | Status: DC
  Administered 2022-11-09: 1 [IU] via SUBCUTANEOUS
  Filled 2022-11-06: qty 0.06

## 2022-11-06 MED ORDER — SODIUM CHLORIDE 0.9 % IV SOLN: INTRAVENOUS | Status: DC

## 2022-11-06 MED ORDER — VITAMIN B-12 1000 MCG PO TABS
1000.0000 ug | ORAL_TABLET | Freq: Every day | ORAL | Status: DC
  Administered 2022-11-06 – 2022-11-10 (×5): 1000 ug via ORAL
  Filled 2022-11-06 (×5): qty 1

## 2022-11-06 MED ORDER — ONDANSETRON HCL 4 MG PO TABS: 4.0000 mg | ORAL_TABLET | Freq: Four times a day (QID) | ORAL | Status: DC | PRN

## 2022-11-06 MED ORDER — PANTOPRAZOLE SODIUM 40 MG PO TBEC
40.0000 mg | DELAYED_RELEASE_TABLET | Freq: Every day | ORAL | Status: DC
  Administered 2022-11-06 – 2022-11-10 (×5): 40 mg via ORAL
  Filled 2022-11-06 (×5): qty 1

## 2022-11-06 MED ORDER — PIPERACILLIN-TAZOBACTAM 3.375 G IVPB 30 MIN
3.3750 g | Freq: Once | INTRAVENOUS | Status: AC
  Administered 2022-11-06: 3.375 g via INTRAVENOUS
  Filled 2022-11-06: qty 50

## 2022-11-06 MED ORDER — ALLOPURINOL 100 MG PO TABS
100.0000 mg | ORAL_TABLET | Freq: Every day | ORAL | Status: DC
  Administered 2022-11-07 – 2022-11-10 (×4): 100 mg via ORAL
  Filled 2022-11-06 (×5): qty 1

## 2022-11-06 MED ORDER — ACETAMINOPHEN 650 MG RE SUPP: 650.0000 mg | Freq: Four times a day (QID) | RECTAL | Status: DC | PRN

## 2022-11-06 MED ORDER — OXYCODONE HCL 5 MG PO TABS
5.0000 mg | ORAL_TABLET | ORAL | Status: DC | PRN
  Administered 2022-11-06 – 2022-11-09 (×5): 5 mg via ORAL
  Filled 2022-11-06 (×5): qty 1

## 2022-11-06 MED ORDER — ACETAMINOPHEN 325 MG PO TABS
650.0000 mg | ORAL_TABLET | Freq: Four times a day (QID) | ORAL | Status: DC | PRN
  Administered 2022-11-07: 650 mg via ORAL
  Filled 2022-11-06: qty 2

## 2022-11-06 MED ORDER — INSULIN ASPART 100 UNIT/ML IJ SOLN
0.0000 [IU] | Freq: Every day | INTRAMUSCULAR | Status: DC
  Filled 2022-11-06: qty 0.05

## 2022-11-06 MED ORDER — VANCOMYCIN HCL IN DEXTROSE 1-5 GM/200ML-% IV SOLN
1000.0000 mg | Freq: Once | INTRAVENOUS | Status: AC
  Administered 2022-11-06: 1000 mg via INTRAVENOUS
  Filled 2022-11-06: qty 200

## 2022-11-06 MED ORDER — SODIUM CHLORIDE 0.9 % IV SOLN
2.0000 g | Freq: Two times a day (BID) | INTRAVENOUS | Status: DC
  Administered 2022-11-07 – 2022-11-08 (×3): 2 g via INTRAVENOUS
  Filled 2022-11-06 (×3): qty 12.5

## 2022-11-06 MED ORDER — VITAMIN D 25 MCG (1000 UNIT) PO TABS
5000.0000 [IU] | ORAL_TABLET | Freq: Every day | ORAL | Status: DC
  Administered 2022-11-06 – 2022-11-10 (×5): 5000 [IU] via ORAL
  Filled 2022-11-06 (×5): qty 5

## 2022-11-06 MED ORDER — GLUCOSE BLOOD VI STRP: ORAL_STRIP | 12 refills | Status: DC

## 2022-11-06 MED ORDER — VITAMIN C 500 MG PO TABS
500.0000 mg | ORAL_TABLET | Freq: Every evening | ORAL | Status: DC
  Administered 2022-11-06 – 2022-11-09 (×4): 500 mg via ORAL
  Filled 2022-11-06 (×4): qty 1

## 2022-11-06 MED ORDER — VANCOMYCIN HCL IN DEXTROSE 1-5 GM/200ML-% IV SOLN: 1000.0000 mg | Freq: Once | INTRAVENOUS | Status: DC

## 2022-11-06 MED ORDER — IOHEXOL 300 MG/ML  SOLN
100.0000 mL | Freq: Once | INTRAMUSCULAR | Status: AC | PRN
  Administered 2022-11-06: 100 mL via INTRAVENOUS

## 2022-11-06 MED ORDER — POTASSIUM CHLORIDE CRYS ER 20 MEQ PO TBCR
40.0000 meq | EXTENDED_RELEASE_TABLET | Freq: Two times a day (BID) | ORAL | Status: DC
  Administered 2022-11-06 – 2022-11-07 (×3): 40 meq via ORAL
  Filled 2022-11-06 (×3): qty 2

## 2022-11-06 MED ORDER — ASPIRIN 81 MG PO TBEC
81.0000 mg | DELAYED_RELEASE_TABLET | Freq: Two times a day (BID) | ORAL | Status: DC
  Administered 2022-11-06 – 2022-11-10 (×8): 81 mg via ORAL
  Filled 2022-11-06 (×8): qty 1

## 2022-11-06 NOTE — Telephone Encounter (Signed)
Per patient, she is out of her lancets and test strips. I told her that an rx was sent in for the lancets on 11/02/2022 to her pharmacy but she states that she went to the pharmacy this morning and they are telling her that we never sent anything. She is completely out. Will you re-send rx for lancets and also submit a re-fill for test strips to [facility] on [address]?

## 2022-11-06 NOTE — ED Provider Triage Note (Signed)
Emergency Medicine Provider Triage Evaluation Note  Kayla Baker , a 86 y.o. female  was evaluated in triage.  Patient presenting at the request of her oncologist to rule out sepsis.  Patient reports that for the past 2 days she has had swelling to her abdomen.  Today her husband noted that she seemed somewhat confused.  Currently under radiation treatment for ovarian cancer.  Has had 1 paracentesis but this was 2 years ago.  Afebrile.    Review of Systems  Positive:  Negative:   Physical Exam  BP (!) 165/71 (BP Location: Left Arm)   Pulse 100   Temp 98.4 F (36.9 C) (Oral)   Resp 16   SpO2 100%  Gen:   Awake, no distress   Resp:  Normal effort  MSK:   Moves extremities without difficulty  Other:    Medical Decision Making  Medically screening exam initiated at 1:44 PM.  Appropriate orders placed.  Kayla Baker was informed that the remainder of the evaluation will be completed by another provider, this initial triage assessment does not replace that evaluation, and the importance of remaining in the ED until their evaluation is complete.     Rhae Hammock, PA-C 11/07/22 1603

## 2022-11-06 NOTE — Progress Notes (Signed)
Pharmacy Antibiotic Note  Kayla Baker is a 86 y.o. female admitted on 11/06/2022 with sepsis.  Pharmacy has been consulted for vanc/cefepime dosing.  Plan: Vanc 1g x 1 then 743m IV q24 - goal AUC 400-550 Cefepime 2g IV q12 per current renal function     Temp (24hrs), Avg:98.4 F (36.9 C), Min:98.4 F (36.9 C), Max:98.4 F (36.9 C)  Recent Labs  Lab 11/06/22 1344 11/06/22 1345  WBC 0.7*  --   CREATININE 1.01*  --   LATICACIDVEN  --  2.3*    Estimated Creatinine Clearance: 37.2 mL/min (A) (by C-G formula based on SCr of 1.01 mg/dL (H)).    Allergies  Allergen Reactions   Mobic [Meloxicam] Swelling   Prednisone Swelling and Other (See Comments)    Can Not take High doses   Premarin [Estrogens Conjugated] Hives   Trovan [Alatrofloxacin] Other (See Comments)    Unknown reaction      Thank you for allowing pharmacy to be a part of this patient's care.  LKara Mead12/05/2022 6:02 PM

## 2022-11-06 NOTE — Telephone Encounter (Signed)
Pt confused x72 hours, per Dr. Lottie Rater we advised Pts husband to take her to [facility] ER now. Husband and Pt report black stool incontinence x48 hours. Dr. [name] given above information.

## 2022-11-06 NOTE — H&P (Signed)
History and Physical    Kayla Baker FKC:[id number] DOB: 1936-01-11 DOA: 11/06/2022  PCP: [name], MD  Patient coming from: Home  I have personally briefly reviewed patient's old medical records available.   Chief Complaint: Confusion and weakness for 2 days  HPI: Kayla Baker is a 86 y.o. 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  Husband at the bedside.  Patient is also a good historian.  Patient tells me that for the last 2 days she feels like she is repeating tasks, her husband noticed that she has been confused.  Husband noticed that she has been confused between different spots in her house.  She was not able to recognize whether she is going to [place] or going to bedroom.  This has been fairly stable for the last 2 days.  Patient denied any hallucinations, delusions. Patient herself stated feeling confused but re orientable.  Only complaint is feeling weak.  She does have mild distention feeling her abdomen. No fever or chills at home.  Denies any cough or cold symptoms.  No wheezing or sputum production. Denies any nausea or vomiting.  Appetite is poor. Denies any dizziness or lightheadedness. Abdomen is mildly distended all over with very mild pain 2/10. Urine is darker than usual but denies any dysuria or frequency. Stool is normal, last bowel movement yesterday.  Husband noticed that she was putting on her clothes but it was a formed stool. ED Course: Afebrile.  Blood pressure stable.  Severely neutropenic with WBC count of 700 with ANC of 0.1.  Lactic acid mildly elevated.   Patient otherwise hemodynamically stable. CT head was without acute findings. CT abdomen pelvis with moderate ascites, inflammatory stranding on the left side of the colon likely reactive due to ascites.  Patient without diarrhea. Blood cultures were drawn and patient started on broad-spectrum antibiotics given severe neutropenia and suspected sepsis of unknown source.  Patient does have a port on her right chest.  Review of Systems: all systems are reviewed and pertinent positive as per HPI otherwise rest are negative.    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[place]) 02/29/2016   skin- 2016- squamous- on right upper arm   Chronic kidney disease    Closed displaced fracture of right femoral neck (HCC) 11/07/2021   Deficiency anemia 11/02/2020   Diabetes mellitus without complication (HCC)    GERD (gastroesophageal reflux disease)    Gout    takes Allopurinol daily   History of chemotherapy    History of colon polyps    benign   History of shingles    Hx of radiation therapy    Hyperlipidemia    Hypertension    Ovarian ca ([place]) dx'd 03/2020   Personal history of chemotherapy    2017   Personal history of radiation therapy    2017   Vitamin D deficiency     Past Surgical History:  Procedure Laterality Date   ABDOMINAL HYSTERECTOMY  1973   BREAST BIOPSY Left 02/26/2016   BREAST LUMPECTOMY  Left 03/18/2016   CARDIAC CATHETERIZATION     patient denies this procedure   CATARACT EXTRACTION, BILATERAL  2014   right eye 2/14; left eye 3/3   COLONOSCOPY     HAND SURGERY Left 2012   HIP ARTHROPLASTY Right 11/08/2021   Procedure: ARTHROPLASTY BIPOLAR HIP (HEMIARTHROPLASTY) POSTERIOR;  Surgeon: Willaim Sheng, MD;  Location: [facility];  Service: Orthopedics;  Laterality: Right;   INCONTINENCE SURGERY  2006   IR IMAGING GUIDED PORT INSERTION   04/24/2020   IR PARACENTESIS  05/08/2020   IR PARACENTESIS  05/16/2020   IR PARACENTESIS  05/25/2020   KNEE ARTHROSCOPY Right    MASTOPEXY Right 06/16/2017   Procedure: RIGHT BREAST MASTOPEXY;  Surgeon: Irene Limbo, MD;  Location: [facility];  Service: Plastics;  Laterality: Right;   PORT-A-CATH REMOVAL N/A 11/27/2016   Procedure: REMOVAL PORT-A-CATH;  Surgeon: Fanny Skates, MD;  Location: [facility];  Service: General;  Laterality: N/A;   PORTACATH PLACEMENT N/A 04/14/2016   Procedure: INSERTION PORT-A-CATH ;  Surgeon: Fanny Skates, MD;  Location: [facility];  Service: General;  Laterality: N/A;   RADIOACTIVE SEED GUIDED PARTIAL MASTECTOMY WITH AXILLARY SENTINEL LYMPH NODE BIOPSY Left 03/18/2016   Procedure: RADIOACTIVE SEED GUIDED LEFT PARTIAL MASTECTOMY WITH AXILLARY SENTINEL LYMPH NODE BIOPSY AND BLUE DYE INJECTION;  Surgeon: Fanny Skates, MD;  Location: [facility];  Service: General;  Laterality: Left;   REDUCTION MAMMAPLASTY Right 2018   TOTAL HIP ARTHROPLASTY Right 08/29/2022   Procedure: incision and drainage hip head exchange;  Surgeon: Willaim Sheng, MD;  Location: [facility];  Service: Orthopedics;  Laterality: Right;    Social history   reports that she has never smoked. She has never used smokeless tobacco. She reports that she does not drink alcohol and does not use drugs.  Allergies  Allergen Reactions   Mobic [Meloxicam] Swelling   Prednisone Swelling and Other (See Comments)    Can Not take High doses   Premarin [Estrogens Conjugated] Hives   Trovan [Alatrofloxacin] Other (See Comments)    Unknown reaction    Family History  Problem Relation Age of Onset   Stroke Mother 57   Other Mother        history of hysterectomy after last childbirth   Diabetes Father    Alzheimer's disease Father    Hypertension Sister    Lung cancer Sister 60       smoker   Other Sister        history of hysterectomy for fibroids   Breast cancer Sister 25   Cirrhosis Sister     Stroke Maternal Grandmother    Heart Problems Maternal Grandmother    Esophageal cancer Maternal Aunt 74       not a smoker   Parkinson's disease Maternal Aunt    Heart attack Maternal Uncle 65   Pancreatic cancer Cousin 55       paternal 1st cousin   Lung cancer Other        nephew dx. 12s; +smoker   Epilepsy Other    Other Other 12       great niece dx. benign ganglioglioma brain tumor; treated at [facility]   Epilepsy Other        no seizures in 2 years   Colon cancer Neg Hx    Stomach cancer Neg Hx    Colon polyps Neg Hx    Ulcerative colitis Neg Hx      Prior to Admission medications   Medication Sig  Start Date End Date Taking? Authorizing Provider  acetaminophen (TYLENOL) 500 MG tablet Take 500 mg by mouth every 6 (six) hours as needed for mild pain, headache or fever.    [provider]  allopurinol (ZYLOPRIM) 100 MG tablet Take 1 tablet (100 mg total) by mouth daily. FOR GOUT PREVENTION, Patient taking differently: Take 100 mg by mouth daily. 03/25/22   Liane Comber, NP  aspirin EC 81 MG tablet Take 1 tablet (81 mg total) by mouth 2 (two) times daily. Swallow whole. 09/02/22   Eugenie Filler, MD  cefadroxil (DURICEF) 500 MG capsule Take 2 capsules (1,000 mg total) by mouth 2 (two) times daily. 09/29/22   Truman Hayward, MD  Cholecalciferol (VITAMIN D3) 5000 units CAPS Take 5,000 Units by mouth daily.    [provider]  cyanocobalamin 1000 MCG tablet Take 1,000 mcg by mouth daily.    [provider]  dexamethasone (DECADRON) 4 MG tablet Take 2 tablets the morning before chemotherapy and then 2 tablets daily for 2 days after chemo 07/01/22   Heath Lark, MD  Ensure Max Protein (ENSURE MAX PROTEIN) LIQD Take 330 mLs (11 oz total) by mouth 2 (two) times daily. 09/02/22   Eugenie Filler, MD  ezetimibe (ZETIA) 10 MG tablet TAKE 1 TABLET BY MOUTH DAILY FOR CHOLESTEROL Patient taking differently: Take 10 mg by mouth daily. 04/24/22   Liane Comber, NP  gabapentin (NEURONTIN) 300 MG capsule Take 1 capsule 4 x  /day for Pain Patient taking differently: Take 300 mg by mouth 4 (four) times daily. 12/07/21   [name], MD  glimepiride (AMARYL) 1 MG tablet Take 1 tab by mouth in the morning if fasting is running 150+. Please do not take this medication if you are ill or not eating as this can cause a low blood sugar. Patient taking differently: Take 1 mg by mouth See admin instructions. Take 1 tab by mouth daily as needed if fasting is running 150+. Please do not take this medication if you are ill or not eating as this can cause a low blood sugar. 05/04/20   Liane Comber, NP  glucose blood test strip Use as instructed 11/06/22   Darrol Jump, NP  Lancets (FREESTYLE) lancets USE TO CHECK BLOOD GLUCOSE ONCE DAILY AS DIRECTED 11/06/22   Darrol Jump, NP  lidocaine-prilocaine (EMLA) cream Apply 1 application. topically daily as needed (for port access). 03/07/22   Heath Lark, MD  magnesium oxide (MAG-OX) 400 (240 Mg) MG tablet Take 1 tablet (400 mg total) by mouth daily. 08/19/22   Heath Lark, MD  metoprolol succinate (TOPROL-XL) 25 MG 24 hr tablet Take  1 tablet  Daily  for BP                                                          /                                       TAKE                            BY  MOUTH 09/21/22   [name], MD  ondansetron (ZOFRAN) 8 MG tablet Take 1 tablet (8 mg total) by mouth every 8 (eight) hours as needed for refractory nausea / vomiting. 06/13/22   Heath Lark, MD  oxyCODONE (OXY IR/ROXICODONE) 5 MG immediate release tablet Take 0.5-1 tablets (2.5-5 mg total) by mouth every 4 (four) hours as needed for severe pain or moderate pain. 09/02/22   Eugenie Filler, MD  pantoprazole (PROTONIX) 40 MG tablet TAKE 1 TABLET(40 MG) BY MOUTH DAILY 10/31/22   Heath Lark, MD  polyethylene glycol (MIRALAX) 17 g packet Take 17 g by mouth 2 (two) times daily. 17 grams in 6 oz of  favorite drink twice a day until bowel movement.  LAXITIVE.  Restart if two days since last bowel movement Patient taking differently: Take 17 g by mouth 2 (two) times daily. 11/10/21   Shepperson, Kirstin, PA-C  rosuvastatin (CRESTOR) 5 MG tablet TAKE 1 TABLET BY MOUTH IN THE EVENING THREE TIMES WEEKLY FOR CHOLESTEROL Patient taking differently: Take 5 mg by mouth See admin instructions. Monday, Wednesday and Friday 03/07/22   Liane Comber, NP  trolamine salicylate (ASPERCREME) 10 % cream Apply 1 application topically as needed for muscle pain (Use if you still feel tingles in feet in the morning after taking Gabapentin).    [provider]  vitamin C (ASCORBIC ACID) 500 MG tablet Take 500 mg by mouth every evening.     [provider]    Physical Exam: Vitals:   11/06/22 1343 11/06/22 1627  BP: (!) 165/71 (!) 158/81  Pulse: 100 91  Resp: 16 16  Temp: 98.4 F (36.9 C)   TempSrc: Oral   SpO2: 100% 98%    Constitutional: NAD, calm, comfortable, pale and debilitated.  Fairly comfortable at rest. Vitals:   11/06/22 1343 11/06/22 1627  BP: (!) 165/71 (!) 158/81  Pulse: 100 91  Resp: 16 16  Temp: 98.4 F (36.9 C)   TempSrc: Oral   SpO2: 100% 98%   Eyes: PERRL, lids and conjunctivae normal ENMT: Mucous membranes are moist. Posterior pharynx clear of any exudate or lesions.Normal dentition.  Neck: normal, supple, no masses, no thyromegaly Respiratory: clear to auscultation bilaterally, no wheezing, no crackles. Normal respiratory effort. No accessory muscle use. Implanted port on the right chest wall. Cardiovascular: Regular rate and rhythm, no murmurs / rubs / gallops.  Asymmetrical edema lower extremity.  Right more than left.  Chronic.  2+ pedal pulses. No carotid bruits.  Abdomen: no tenderness, no masses palpated. No hepatosplenomegaly. Bowel sounds positive.  Mildly distended but nontender. Musculoskeletal: no clubbing / cyanosis. No joint deformity upper and  lower extremities. Good ROM, no contractures. Normal muscle tone.  Skin: no rashes, lesions, ulcers. No induration Neurologic: CN 2-12 grossly intact. Sensation intact, DTR normal. Strength 5/5 in all 4.  Psychiatric: Normal judgment and insight. Alert and oriented x 3. Normal mood.     Labs on Admission: I have personally reviewed following labs and imaging studies  CBC: Recent Labs  Lab 11/06/22 1344  WBC 0.7*  NEUTROABS 0.1*  HGB 9.6*  HCT 30.1*  MCV 98.4  PLT 54*   Basic Metabolic Panel: Recent Labs  Lab 11/06/22 1344  NA 139  K 3.2*  CL 108  CO2 25  GLUCOSE 136*  BUN 32*  CREATININE 1.01*  CALCIUM 8.7*   GFR: Estimated Creatinine Clearance: 37.2 mL/min (A) (by C-G formula based on SCr of 1.01 mg/dL (H)). Liver Function Tests: Recent Labs  Lab 11/06/22 1344  AST 34  ALT 24  ALKPHOS 96  BILITOT 1.2  PROT 5.9*  ALBUMIN 2.6*   Recent Labs  Lab 11/06/22 1344  LIPASE 29   Recent Labs  Lab 11/06/22 1344  AMMONIA 29   Coagulation Profile: Recent Labs  Lab 11/06/22 1344  INR 1.3*   Cardiac Enzymes: No results for input(s): "CKTOTAL", "CKMB", "CKMBINDEX", "TROPONINI" in the last 168 hours. BNP (last 3 results) No results for input(s): "PROBNP" in the last 8760 hours. HbA1C: No results for input(s): "HGBA1C" in the last 72 hours. CBG: No results for input(s): "GLUCAP" in the last 168 hours. Lipid Profile: No results for input(s): "CHOL", "HDL", "LDLCALC", "TRIG", "CHOLHDL", "LDLDIRECT" in the last 72 hours. Thyroid Function Tests: No results for input(s): "TSH", "T4TOTAL", "FREET4", "T3FREE", "THYROIDAB" in the last 72 hours. Anemia Panel: No results for input(s): "VITAMINB12", "FOLATE", "FERRITIN", "TIBC", "IRON", "RETICCTPCT" in the last 72 hours. Urine analysis:    Component Value Date/Time   COLORURINE YELLOW 09/15/2022 0000   APPEARANCEUR CLEAR 09/15/2022 0000   LABSPEC 1.028 09/15/2022 0000   PHURINE 5.5 09/15/2022 0000   GLUCOSEU  NEGATIVE 09/15/2022 0000   HGBUR NEGATIVE 09/15/2022 0000   BILIRUBINUR NEGATIVE 08/24/2022 1151   KETONESUR TRACE (A) 09/15/2022 0000   PROTEINUR 1+ (A) 09/15/2022 0000   NITRITE NEGATIVE 09/15/2022 0000   LEUKOCYTESUR TRACE (A) 09/15/2022 0000    Radiological Exams on Admission: CT Head Wo Contrast  Result Date: 11/06/2022 CLINICAL DATA:  Provided history: Headache, sudden, severe. EXAM: CT HEAD WITHOUT CONTRAST TECHNIQUE: Contiguous axial images were obtained from the base of the skull through the vertex without intravenous contrast. RADIATION DOSE REDUCTION: This exam was performed according to the departmental dose-optimization program which includes automated exposure control, adjustment of the mA and/or kV according to patient size and/or use of iterative reconstruction technique. COMPARISON:  Head CT 08/22/2022. Brain MRI 09/24/2006. FINDINGS: Brain: Mild-to-moderate generalized cerebral atrophy. Mild patchy and ill-defined hypoattenuation within the cerebral white matter, nonspecific but compatible with chronic small vessel ischemic disease. There is no acute intracranial hemorrhage. No demarcated cortical infarct. No extra-axial fluid collection. No evidence of an intracranial mass. No midline shift. Vascular: No hyperdense vessel.  Atherosclerotic calcifications. Skull: No fracture or aggressive osseous lesion. Sinuses/Orbits: No mass or acute finding within the imaged orbits. No significant paranasal sinus disease at the imaged levels Other: Partially imaged chronic bilateral frontal process of maxilla fractures. IMPRESSION: No evidence of acute intracranial abnormality. Mild chronic small vessel ischemic changes within the cerebral white matter. Mild-to-moderate generalized cerebral atrophy. Electronically Signed   By: Kellie Simmering D.O.   On: 11/06/2022 16:21   CT ABDOMEN PELVIS W CONTRAST  Result Date: 11/06/2022 CLINICAL DATA:  Acute abdominal pain and distension. Black stool. History  of ovarian cancer. EXAM: CT ABDOMEN AND PELVIS WITH CONTRAST TECHNIQUE: Multidetector CT imaging of the abdomen and pelvis was performed using the standard protocol following bolus administration of intravenous contrast. RADIATION DOSE REDUCTION: This exam was performed according to the departmental dose-optimization program which includes automated exposure control, adjustment of the mA and/or kV according to patient size and/or use of iterative reconstruction technique. CONTRAST:  165m OMNIPAQUE IOHEXOL 300 MG/ML  SOLN COMPARISON:  CT abdomen and pelvis 10/28/2022 FINDINGS: Lower chest: No acute abnormality. Rounded low-attenuation area in the right lung base along the hemidiaphragm has slightly increased in size measuring up to 2 cm (previously 1.4 cm. This may represent small diaphragmatic hernia containing ascites or low-density nodule. Hepatobiliary: No  focal liver abnormality is seen. No gallstones, gallbladder wall thickening, or biliary dilatation. Pancreas: Unremarkable. No pancreatic ductal dilatation or surrounding inflammatory changes. Spleen: Normal in size without focal abnormality. Adrenals/Urinary Tract: Limited evaluation of the bladder secondary to streak artifact in the pelvis. The kidneys appears stable. There is a cyst in the left kidney measuring 13 mm. No hydronephrosis or perinephric fat stranding. The adrenal glands are within normal limits. Stomach/Bowel: No evidence for bowel obstruction, pneumatosis or free air. There is wall thickening and mild inflammatory stranding of the sigmoid colon to the level the rectum. Stomach is decompressed and not well evaluated. There is also some mild wall thickening of jejunal loops in the left abdomen. The appendix is not visualized. Vascular/Lymphatic: Aortic atherosclerosis. No enlarged abdominal or pelvic lymph nodes. Reproductive: Status post hysterectomy. No adnexal masses. Other: There is a moderate-to-large amount of ascites throughout the  abdomen and pelvis which has increased from prior. Peritoneal nodularity in the right abdomen measuring up to 11 mm and in the anterior left abdomen measuring up to 5 mm appears unchanged. Small scattered nodules or lymph nodes in the pelvis adjacent to the sigmoid colon have not significantly changed. Musculoskeletal: Right hip arthroplasty is present. No acute fracture or focal osseous lesion. IMPRESSION: 1. Wall thickening and inflammatory stranding of the sigmoid colon and rectum compatible with colitis/proctitis. 2. Mild wall thickening of jejunal loops in the left abdomen compatible with enteritis. 3. Moderate-to-large amount of ascites throughout the abdomen and pelvis has increased from prior. 4. Stable peritoneal nodularity compatible with metastatic disease. 5. Rounded low-attenuation area in the right lung base along the hemidiaphragm has slightly increased in size. This may represent small diaphragmatic hernia containing ascites or low-density nodule. Aortic Atherosclerosis (ICD10-I70.0). Electronically Signed   By: Ronney Asters M.D.   On: 11/06/2022 16:18     Assessment/Plan Principal Problem:   Sepsis ([place]) Active Problems:   Prosthetic hip infection ([place])   Essential hypertension   Chemotherapy-induced peripheral neuropathy ([place])   CKD stage 3 due to type 2 diabetes mellitus ([place])   Ovarian cancer ([facility])   Pancytopenia, acquired ([place])     1.  Sepsis, presumed due to severe neutropenia, altered mental status.  Multiple possible source of infection. 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 Blood cultures, urine cultures, Will start patient on broad-spectrum antibiotics with vancomycin and cefepime due to severe neutropenia. Paracentesis for diagnostic tap would be useful but holding off due to risk of introducing infection with severe neutropenia.  Once her  neutrophil count is improved, may attempt paracentesis. No respiratory symptoms to check for COVID or flu.  2.  Metabolic encephalopathy, infective metabolic encephalopathy: Nonfocal exam.  Mental status stable. V76, folic acid, magnesium, phosphorus to be monitored. Ammonia was normal. CT head was normal. Monitor.  3.  Ovarian cancer on Taxol, severe pancytopenia: Oncology aware.  Patient had received Neulasta after her chemotherapy last week.  Will defer to oncology for additional G-CSF.  Neutropenic precautions all time.  4.  Essential hypertension: Blood pressure stable.  She is at risk of hypotension.  Continue beta-blockers but hold other antihypertensives.  5.  Type 2 diabetes, well-controlled: She is on glipizide at home.  Keep on very low-dose sliding scale insulin.   DVT prophylaxis: Aspirin, SCDs Code Status: Full code.  Patient desires all resuscitation and therapies. Family Communication: Husband at the bedside Disposition Plan: Possible home once stable Consults  called: Oncology, called by ER Admission status: Inpatient.  Telemetry bed.   Barb Merino MD [facility] Pager [phone]

## 2022-11-06 NOTE — ED Notes (Signed)
Date and time results received: 11/06/22 1540  Test: WBC Critical Value: 0.7  Name of Provider Notified: Alyse Low, PA-C  No verbal orders received

## 2022-11-06 NOTE — ED Provider Notes (Signed)
[facility] DEPT Provider Note   CSN: [id number] Arrival date & time: 11/06/22  1333     History  Chief Complaint  Patient presents with   Bloated    Kayla Baker is a 86 y.o. female history of metastatic ovarian cancer on chemotherapy, recent prosthetic hip infection chronic Duricef, here presenting with abdominal distention and confusion.  Patient received a dose of chemotherapy about a week ago and the Neulasta.  Since yesterday she has been having worsening abdominal distention.  She also has been very confused.  Patient has no fevers at home.  Oncology call the patient and recommend that she comes in for sepsis workup.   The history is provided by the patient.       Home Medications Prior to Admission medications   Medication Sig Start Date End Date Taking? Authorizing Provider  acetaminophen (TYLENOL) 500 MG tablet Take 500 mg by mouth every 6 (six) hours as needed for mild pain, headache or fever.    [provider]  allopurinol (ZYLOPRIM) 100 MG tablet Take 1 tablet (100 mg total) by mouth daily. FOR GOUT PREVENTION, Patient taking differently: Take 100 mg by mouth daily. 03/25/22   Liane Comber, NP  aspirin EC 81 MG tablet Take 1 tablet (81 mg total) by mouth 2 (two) times daily. Swallow whole. 09/02/22   Eugenie Filler, MD  cefadroxil (DURICEF) 500 MG capsule Take 2 capsules (1,000 mg total) by mouth 2 (two) times daily. 09/29/22   Truman Hayward, MD  Cholecalciferol (VITAMIN D3) 5000 units CAPS Take 5,000 Units by mouth daily.    [provider]  cyanocobalamin 1000 MCG tablet Take 1,000 mcg by mouth daily.    [provider]  dexamethasone (DECADRON) 4 MG tablet Take 2 tablets the morning before chemotherapy and then 2 tablets daily for 2 days after chemo 07/01/22   Heath Lark, MD  Ensure Max Protein (ENSURE MAX PROTEIN) LIQD Take 330 mLs (11 oz total) by mouth 2 (two) times daily. 09/02/22   Eugenie Filler, MD  ezetimibe (ZETIA) 10 MG tablet TAKE 1 TABLET BY MOUTH DAILY FOR CHOLESTEROL Patient taking differently: Take 10 mg by mouth daily. 04/24/22   Liane Comber, NP  gabapentin (NEURONTIN) 300 MG capsule Take 1 capsule 4 x  /day for Pain Patient taking differently: Take 300 mg by mouth 4 (four) times daily. 12/07/21   [name], MD  glimepiride (AMARYL) 1 MG tablet Take 1 tab by mouth in the morning if fasting is running 150+. Please do not take this medication if you are ill or not eating as this can cause a low blood sugar. Patient taking differently: Take 1 mg by mouth See admin instructions. Take 1 tab by mouth daily as needed if fasting is running 150+. Please do not take this medication if you are ill or not eating as this can cause a low blood sugar. 05/04/20   Liane Comber, NP  glucose blood test strip Use as instructed 11/06/22   Darrol Jump, NP  Lancets (FREESTYLE) lancets USE TO CHECK BLOOD GLUCOSE ONCE DAILY AS DIRECTED 11/06/22   Darrol Jump, NP  lidocaine-prilocaine (EMLA) cream Apply 1 application. topically daily as needed (for port access). 03/07/22   Heath Lark, MD  magnesium oxide (MAG-OX) 400 (240 Mg) MG tablet Take 1 tablet (400 mg total) by mouth daily. 08/19/22   Heath Lark, MD  metoprolol succinate (TOPROL-XL) 25 MG 24 hr tablet Take  1 tablet  Daily  for BP                                                          /                                       TAKE                            BY                                 MOUTH 09/21/22   [name], MD  ondansetron (ZOFRAN) 8 MG tablet Take 1 tablet (8 mg total) by mouth every 8 (eight) hours as needed for refractory nausea / vomiting. 06/13/22   Heath Lark, MD  oxyCODONE (OXY IR/ROXICODONE) 5 MG immediate release tablet Take 0.5-1 tablets (2.5-5 mg total) by mouth every 4 (four) hours as needed for severe pain or moderate pain. 09/02/22   Eugenie Filler, MD  pantoprazole (PROTONIX) 40 MG tablet TAKE  1 TABLET(40 MG) BY MOUTH DAILY 10/31/22   Heath Lark, MD  polyethylene glycol (MIRALAX) 17 g packet Take 17 g by mouth 2 (two) times daily. 17 grams in 6 oz of favorite drink twice a day until bowel movement.  LAXITIVE.  Restart if two days since last bowel movement Patient taking differently: Take 17 g by mouth 2 (two) times daily. 11/10/21   Shepperson, Kirstin, PA-C  rosuvastatin (CRESTOR) 5 MG tablet TAKE 1 TABLET BY MOUTH IN THE EVENING THREE TIMES WEEKLY FOR CHOLESTEROL Patient taking differently: Take 5 mg by mouth See admin instructions. Monday, Wednesday and Friday 03/07/22   Liane Comber, NP  trolamine salicylate (ASPERCREME) 10 % cream Apply 1 application topically as needed for muscle pain (Use if you still feel tingles in feet in the morning after taking Gabapentin).    [provider]  vitamin C (ASCORBIC ACID) 500 MG tablet Take 500 mg by mouth every evening.     [provider]      Allergies    Mobic [meloxicam], Prednisone, Premarin [estrogens conjugated], and Trovan [alatrofloxacin]    Review of Systems   Review of Systems  Gastrointestinal:  Positive for abdominal distention.  All other systems reviewed and are negative.   Physical Exam Updated Vital Signs BP (!) 158/81   Pulse 91   Temp 98.4 F (36.9 C) (Oral)   Resp 16   SpO2 98%  Physical Exam Vitals and nursing note reviewed.  Constitutional:      Comments: Chronically ill   HENT:     Head: Normocephalic.     Nose: Nose normal.     Mouth/Throat:     Mouth: Mucous membranes are moist.  Eyes:     Extraocular Movements: Extraocular movements intact.     Pupils: Pupils are equal, round, and reactive to light.  Cardiovascular:     Rate and Rhythm: Normal rate and regular rhythm.     Pulses: Normal pulses.     Heart sounds: Normal heart sounds.  Pulmonary:     Effort: Pulmonary effort is normal.  Breath sounds: Normal breath sounds.  Abdominal:     Comments: Distended and positive  fluid wave and mild diffuse tenderness  Musculoskeletal:        General: Normal range of motion.     Cervical back: Normal range of motion and neck supple.  Skin:    General: Skin is warm.     Capillary Refill: Capillary refill takes less than 2 seconds.  Neurological:     General: No focal deficit present.     Mental Status: She is alert and oriented to person, place, and time.     Comments: ANO x 3, no obvious asterixis and patient has normal strength and sensation bilateral arms and legs  Psychiatric:        Mood and Affect: Mood normal.        Behavior: Behavior normal.     ED Results / Procedures / Treatments   Labs (all labs ordered are listed, but only abnormal results are displayed) Labs Reviewed  COMPREHENSIVE METABOLIC PANEL - Abnormal; Notable for the following components:      Result Value   Potassium 3.2 (*)    Glucose, Bld 136 (*)    BUN 32 (*)    Creatinine, Ser 1.01 (*)    Calcium 8.7 (*)    Total Protein 5.9 (*)    Albumin 2.6 (*)    GFR, Estimated 55 (*)    All other components within normal limits  CBC - Abnormal; Notable for the following components:   WBC 0.7 (*)    RBC 3.06 (*)    Hemoglobin 9.6 (*)    HCT 30.1 (*)    RDW 16.8 (*)    Platelets 54 (*)    All other components within normal limits  PROTIME-INR - Abnormal; Notable for the following components:   Prothrombin Time 16.2 (*)    INR 1.3 (*)    All other components within normal limits  LACTIC ACID, PLASMA - Abnormal; Notable for the following components:   Lactic Acid, Venous 2.3 (*)    All other components within normal limits  CULTURE, BLOOD (ROUTINE X 2)  CULTURE, BLOOD (ROUTINE X 2)  LIPASE, BLOOD  AMMONIA  URINALYSIS, ROUTINE W REFLEX MICROSCOPIC  LACTIC ACID, PLASMA  DIFFERENTIAL    EKG None  Radiology CT Head Wo Contrast  Result Date: 11/06/2022 CLINICAL DATA:  Provided history: Headache, sudden, severe. EXAM: CT HEAD WITHOUT CONTRAST TECHNIQUE: Contiguous axial images  were obtained from the base of the skull through the vertex without intravenous contrast. RADIATION DOSE REDUCTION: This exam was performed according to the departmental dose-optimization program which includes automated exposure control, adjustment of the mA and/or kV according to patient size and/or use of iterative reconstruction technique. COMPARISON:  Head CT 08/22/2022. Brain MRI 09/24/2006. FINDINGS: Brain: Mild-to-moderate generalized cerebral atrophy. Mild patchy and ill-defined hypoattenuation within the cerebral white matter, nonspecific but compatible with chronic small vessel ischemic disease. There is no acute intracranial hemorrhage. No demarcated cortical infarct. No extra-axial fluid collection. No evidence of an intracranial mass. No midline shift. Vascular: No hyperdense vessel.  Atherosclerotic calcifications. Skull: No fracture or aggressive osseous lesion. Sinuses/Orbits: No mass or acute finding within the imaged orbits. No significant paranasal sinus disease at the imaged levels Other: Partially imaged chronic bilateral frontal process of maxilla fractures. IMPRESSION: No evidence of acute intracranial abnormality. Mild chronic small vessel ischemic changes within the cerebral white matter. Mild-to-moderate generalized cerebral atrophy. Electronically Signed   By: Kellie Simmering D.O.  On: 11/06/2022 16:21   CT ABDOMEN PELVIS W CONTRAST  Result Date: 11/06/2022 CLINICAL DATA:  Acute abdominal pain and distension. Black stool. History of ovarian cancer. EXAM: CT ABDOMEN AND PELVIS WITH CONTRAST TECHNIQUE: Multidetector CT imaging of the abdomen and pelvis was performed using the standard protocol following bolus administration of intravenous contrast. RADIATION DOSE REDUCTION: This exam was performed according to the departmental dose-optimization program which includes automated exposure control, adjustment of the mA and/or kV according to patient size and/or use of iterative reconstruction  technique. CONTRAST:  134m OMNIPAQUE IOHEXOL 300 MG/ML  SOLN COMPARISON:  CT abdomen and pelvis 10/28/2022 FINDINGS: Lower chest: No acute abnormality. Rounded low-attenuation area in the right lung base along the hemidiaphragm has slightly increased in size measuring up to 2 cm (previously 1.4 cm. This may represent small diaphragmatic hernia containing ascites or low-density nodule. Hepatobiliary: No focal liver abnormality is seen. No gallstones, gallbladder wall thickening, or biliary dilatation. Pancreas: Unremarkable. No pancreatic ductal dilatation or surrounding inflammatory changes. Spleen: Normal in size without focal abnormality. Adrenals/Urinary Tract: Limited evaluation of the bladder secondary to streak artifact in the pelvis. The kidneys appears stable. There is a cyst in the left kidney measuring 13 mm. No hydronephrosis or perinephric fat stranding. The adrenal glands are within normal limits. Stomach/Bowel: No evidence for bowel obstruction, pneumatosis or free air. There is wall thickening and mild inflammatory stranding of the sigmoid colon to the level the rectum. Stomach is decompressed and not well evaluated. There is also some mild wall thickening of jejunal loops in the left abdomen. The appendix is not visualized. Vascular/Lymphatic: Aortic atherosclerosis. No enlarged abdominal or pelvic lymph nodes. Reproductive: Status post hysterectomy. No adnexal masses. Other: There is a moderate-to-large amount of ascites throughout the abdomen and pelvis which has increased from prior. Peritoneal nodularity in the right abdomen measuring up to 11 mm and in the anterior left abdomen measuring up to 5 mm appears unchanged. Small scattered nodules or lymph nodes in the pelvis adjacent to the sigmoid colon have not significantly changed. Musculoskeletal: Right hip arthroplasty is present. No acute fracture or focal osseous lesion. IMPRESSION: 1. Wall thickening and inflammatory stranding of the sigmoid  colon and rectum compatible with colitis/proctitis. 2. Mild wall thickening of jejunal loops in the left abdomen compatible with enteritis. 3. Moderate-to-large amount of ascites throughout the abdomen and pelvis has increased from prior. 4. Stable peritoneal nodularity compatible with metastatic disease. 5. Rounded low-attenuation area in the right lung base along the hemidiaphragm has slightly increased in size. This may represent small diaphragmatic hernia containing ascites or low-density nodule. Aortic Atherosclerosis (ICD10-I70.0). Electronically Signed   By: ARonney AstersM.D.   On: 11/06/2022 16:18    Procedures Procedures    CRITICAL CARE Performed by: DWandra Arthurs  Total critical care time: 30 minutes  Critical care time was exclusive of separately billable procedures and treating other patients.  Critical care was necessary to treat or prevent imminent or life-threatening deterioration.  Critical care was time spent personally by me on the following activities: development of treatment plan with patient and/or surrogate as well as nursing, discussions with consultants, evaluation of patient's response to treatment, examination of patient, obtaining history from patient or surrogate, ordering and performing treatments and interventions, ordering and review of laboratory studies, ordering and review of radiographic studies, pulse oximetry and re-evaluation of patient's condition.    Medications Ordered in ED Medications  vancomycin (VANCOCIN) IVPB 1000 mg/200 mL premix (has no administration  in time range)  piperacillin-tazobactam (ZOSYN) IVPB 3.375 g (has no administration in time range)  iohexol (OMNIPAQUE) 300 MG/ML solution 100 mL (100 mLs Intravenous Contrast Given 11/06/22 1545)    ED Course/ Medical Decision Making/ A&P                           Medical Decision Making Charnay LEZLIE RITCHEY is a 86 y.o. female here presenting with confusion and abdominal distention.  Patient is  afebrile and vitals are stable.  Patient is on chronic Duricef for recent prosthetic hip infection.  Patient also has chemotherapy.  Concern for possible sepsis and possible SBP versus pneumonia versus UTI.  Plan to get CBC and CMP and lactate and cultures and CT head and CT abdomen pelvis.  5:26 PM Patient is neutropenic with white blood cell count of 0.7.  Differential is pending.  CT abdomen pelvis showed enteritis and colitis and worsening ascites.  I discussed case with Dr. [name] and Dr. Irene Limbo from oncology.  They want me to hold off on paracentesis for now and just cover with broad-spectrum antibiotics.  Dr. [name] will see patient tomorrow.  Hospitalist to admit.    Problems Addressed: Enteritis: acute illness or injury Neutropenia, unspecified type ([place]): acute illness or injury Other ascites: acute illness or injury  Amount and/or Complexity of Data Reviewed Labs: ordered. Decision-making details documented in ED Course. Radiology: ordered and independent interpretation performed. Decision-making details documented in ED Course.  Risk Prescription drug management. Decision regarding hospitalization.    Final Clinical Impression(s) / ED Diagnoses Final diagnoses:  None    Rx / DC Orders ED Discharge Orders     None         Drenda Freeze, MD 11/06/22 1730

## 2022-11-06 NOTE — ED Triage Notes (Signed)
Pt reports abd distention and AMS x72hrs Black stool incontinence x48hrs Hx of ovarian cancer.  Denies fever, bodyaches, chills

## 2022-11-07 DIAGNOSIS — R188 Other ascites: Secondary | ICD-10-CM

## 2022-11-07 DIAGNOSIS — C569 Malignant neoplasm of unspecified ovary: Secondary | ICD-10-CM

## 2022-11-07 DIAGNOSIS — D709 Neutropenia, unspecified: Secondary | ICD-10-CM | POA: Diagnosis not present

## 2022-11-07 DIAGNOSIS — G620 Drug-induced polyneuropathy: Secondary | ICD-10-CM | POA: Diagnosis not present

## 2022-11-07 DIAGNOSIS — D61818 Other pancytopenia: Secondary | ICD-10-CM | POA: Diagnosis not present

## 2022-11-07 DIAGNOSIS — A419 Sepsis, unspecified organism: Secondary | ICD-10-CM | POA: Diagnosis not present

## 2022-11-07 DIAGNOSIS — K529 Noninfective gastroenteritis and colitis, unspecified: Secondary | ICD-10-CM | POA: Diagnosis not present

## 2022-11-07 DIAGNOSIS — R6520 Severe sepsis without septic shock: Secondary | ICD-10-CM

## 2022-11-07 LAB — CBC WITH DIFFERENTIAL/PLATELET
Abs Immature Granulocytes: 0.18 10*3/uL — ABNORMAL HIGH (ref 0.00–0.07)
Basophils Absolute: 0 10*3/uL (ref 0.0–0.1)
Basophils Relative: 2 %
Eosinophils Absolute: 0 10*3/uL (ref 0.0–0.5)
Eosinophils Relative: 2 %
HCT: 27.5 % — ABNORMAL LOW (ref 36.0–46.0)
Hemoglobin: 8.9 g/dL — ABNORMAL LOW (ref 12.0–15.0)
Immature Granulocytes: 10 %
Lymphocytes Relative: 38 %
Lymphs Abs: 0.7 10*3/uL (ref 0.7–4.0)
MCH: 31.8 pg (ref 26.0–34.0)
MCHC: 32.4 g/dL (ref 30.0–36.0)
MCV: 98.2 fL (ref 80.0–100.0)
Monocytes Absolute: 0.4 10*3/uL (ref 0.1–1.0)
Monocytes Relative: 19 %
Neutro Abs: 0.5 10*3/uL — ABNORMAL LOW (ref 1.7–7.7)
Neutrophils Relative %: 29 %
Platelets: 49 10*3/uL — ABNORMAL LOW (ref 150–400)
RBC: 2.8 MIL/uL — ABNORMAL LOW (ref 3.87–5.11)
RDW: 16.6 % — ABNORMAL HIGH (ref 11.5–15.5)
WBC: 1.8 10*3/uL — ABNORMAL LOW (ref 4.0–10.5)
nRBC: 0 % (ref 0.0–0.2)

## 2022-11-07 LAB — GLUCOSE, CAPILLARY
Glucose-Capillary: 148 mg/dL — ABNORMAL HIGH (ref 70–99)
Glucose-Capillary: 135 mg/dL — ABNORMAL HIGH (ref 70–99)
Glucose-Capillary: 115 mg/dL — ABNORMAL HIGH (ref 70–99)

## 2022-11-07 LAB — CBG MONITORING, ED
Glucose-Capillary: 119 mg/dL — ABNORMAL HIGH (ref 70–99)
Glucose-Capillary: 129 mg/dL — ABNORMAL HIGH (ref 70–99)

## 2022-11-07 MED ORDER — MAGNESIUM SULFATE 4 GM/100ML IV SOLN
4.0000 g | Freq: Once | INTRAVENOUS | Status: AC
  Administered 2022-11-07: 4 g via INTRAVENOUS
  Filled 2022-11-07: qty 100

## 2022-11-07 MED ORDER — TBO-FILGRASTIM 300 MCG/0.5ML ~~LOC~~ SOSY
300.0000 ug | PREFILLED_SYRINGE | Freq: Every day | SUBCUTANEOUS | Status: DC
  Administered 2022-11-07: 300 ug via SUBCUTANEOUS
  Filled 2022-11-07 (×2): qty 0.5

## 2022-11-07 MED ORDER — POTASSIUM PHOSPHATES 15 MMOLE/5ML IV SOLN
30.0000 mmol | Freq: Once | INTRAVENOUS | Status: AC
  Administered 2022-11-07: 30 mmol via INTRAVENOUS
  Filled 2022-11-07: qty 10

## 2022-11-07 NOTE — Progress Notes (Signed)
Modelle Vollmer [facility]   DOB:[birth year]   ZR#:[id number]    ASSESSMENT & PLAN:   Ovarian cancer Kayla [facility]) Prior history of breast cancer She is weak and debilitated CT imaging study showed minimal disease progression She has multiple, recurrent hospitalization related to infection This is despite significant dose adjustment to her treatment and the addition of G-CSF support recently I have a frank discussion with the patient and family today She is not likely going to gain any benefit from further palliative chemo; in fact, the complication from palliative chemotherapy might end her life sooner than complications from cancer progression The patient understood the rationale of stopping treatment I recommend transitioning her care next week when she is ready to be discharged to home-based palliative care with hospice services  Neutropenic fever Based on CT imaging, the source of infection is likely in her peritoneum/intestinal tract Recurrent infection of her hip cannot be excluded I will prescribe low-dose G-CSF support She will continue IV antibiotics  Anemia in neoplastic disease Observe closely No transfusion is needed today  Acute on chronic thrombocytopenia This is likely related to infection, recent chemotherapy and side effects of antibiotics Monitor closely I do not recommend paracentesis due to high risk of bleeding  Malignant ascites While she felt bloated, at this point in time, I recommend we defer paracentesis until her platelet count improved to over 50,000 or sometime next week  Bilateral lower extremity edema This is due to third spacing I do not recommend aggressive diuretic therapy   CKD stage 3 due to type 2 diabetes mellitus (Kayla Baker) She has intermittent acute on chronic renal failure Observe closely   Goals of care discussion I have numerous goals of care discussion with the patient and family in the past Today, I recommend discontinuation of palliative chemotherapy  permanently The patient and family members agree I recommend palliative care consult to continue to follow the patient and review of services that can be provided after discharge  Discharge planning I am hopeful she can be discharged early next week I will return early Monday to check on her and hopefully we can get her home if everything improves  All questions were answered. The patient knows to call the clinic with any problems, questions or concerns.   The total time spent in the appointment was 55 minutes encounter with patients including review of chart and various tests results, discussions about plan of care and coordination of care plan  Heath Lark, MD 11/07/2022 7:58 AM  Subjective:  The patient was admitted to the [facility] yesterday She did not remember much but family members describe confusion episode for the last 48 hours CT imaging of the brain was negative for stroke CT imaging of the abdomen showed enteritis and worsening ascites This morning, she is surrounded by her husband and stepdaughter She appears alert and oriented She felt better this morning   Objective:  Vitals:   11/07/22 0618 11/07/22 0619  BP: 136/64   Pulse: 80   Resp: 16   Temp:  98.4 F (36.9 C)  SpO2: 97%      Intake/Output Summary (Last 24 hours) at 11/07/2022 0758 Last data filed at 11/06/2022 1942 Gross per 24 hour  Intake 250 ml  Output --  Net 250 ml    GENERAL:alert, no distress and comfortable NEURO: alert & oriented x 3 with fluent speech, no focal motor/sensory deficits   Labs:  Recent Labs    08/22/22 1539 08/24/22 1029 08/26/22 1258 08/27/22 0706 09/02/22 0500 09/15/22  0000 10/28/22 1100 11/06/22 1344  NA  --    < > 137   < > 137 145 142 139  K  --    < > 4.3   < > 3.6 4.4 3.6 3.2*  CL  --    < > 106   < > 105 109 109 108  CO2  --    < > 23   < > 25 27 28 25   GLUCOSE  --    < > 108*   < > 106* 100* 89 136*  BUN  --    < > 18   < > 35* 20 20 32*  CREATININE  --     < > 1.39*   < > 1.52* 1.09* 1.02* 1.01*  CALCIUM  --    < > 8.9   < > 8.5* 9.6 9.8 8.7*  GFRNONAA  --    < > 37*   < > 34*  --  54* 55*  PROT 5.9*   < > 6.7  --   --  7.1 7.1 5.9*  ALBUMIN 2.1*   < > 2.4*  --   --   --  3.1* 2.6*  AST 57*   < > 67*  --   --  48* 42* 34  ALT 34   < > 33  --   --  3* 14 24  ALKPHOS 107   < > 137*  --   --   --  135* 96  BILITOT 0.8   < > 0.9  --   --  0.7 0.8 1.2  BILIDIR 0.2  --   --   --   --   --   --   --   IBILI 0.6  --   --   --   --   --   --   --    < > = values in this interval not displayed.    Studies: I have personally reviewed her CT imaging CT Head Wo Contrast  Result Date: 11/06/2022 CLINICAL DATA:  Provided history: Headache, sudden, severe. EXAM: CT HEAD WITHOUT CONTRAST TECHNIQUE: Contiguous axial images were obtained from the base of the skull through the vertex without intravenous contrast. RADIATION DOSE REDUCTION: This exam was performed according to the departmental dose-optimization program which includes automated exposure control, adjustment of the mA and/or kV according to patient size and/or use of iterative reconstruction technique. COMPARISON:  Head CT 08/22/2022. Brain MRI 09/24/2006. FINDINGS: Brain: Mild-to-moderate generalized cerebral atrophy. Mild patchy and ill-defined hypoattenuation within the cerebral white matter, nonspecific but compatible with chronic small vessel ischemic disease. There is no acute intracranial hemorrhage. No demarcated cortical infarct. No extra-axial fluid collection. No evidence of an intracranial mass. No midline shift. Vascular: No hyperdense vessel.  Atherosclerotic calcifications. Skull: No fracture or aggressive osseous lesion. Sinuses/Orbits: No mass or acute finding within the imaged orbits. No significant paranasal sinus disease at the imaged levels Other: Partially imaged chronic bilateral frontal process of maxilla fractures. IMPRESSION: No evidence of acute intracranial abnormality. Mild  chronic small vessel ischemic changes within the cerebral white matter. Mild-to-moderate generalized cerebral atrophy. Electronically Signed   By: Kellie Simmering D.O.   On: 11/06/2022 16:21   CT ABDOMEN PELVIS W CONTRAST  Result Date: 11/06/2022 CLINICAL DATA:  Acute abdominal pain and distension. Black stool. History of ovarian cancer. EXAM: CT ABDOMEN AND PELVIS WITH CONTRAST TECHNIQUE: Multidetector CT imaging of the abdomen and pelvis was performed using the  standard protocol following bolus administration of intravenous contrast. RADIATION DOSE REDUCTION: This exam was performed according to the departmental dose-optimization program which includes automated exposure control, adjustment of the mA and/or kV according to patient size and/or use of iterative reconstruction technique. CONTRAST:  132m OMNIPAQUE IOHEXOL 300 MG/ML  SOLN COMPARISON:  CT abdomen and pelvis 10/28/2022 FINDINGS: Lower chest: No acute abnormality. Rounded low-attenuation area in the right lung base along the hemidiaphragm has slightly increased in size measuring up to 2 cm (previously 1.4 cm. This may represent small diaphragmatic hernia containing ascites or low-density nodule. Hepatobiliary: No focal liver abnormality is seen. No gallstones, gallbladder wall thickening, or biliary dilatation. Pancreas: Unremarkable. No pancreatic ductal dilatation or surrounding inflammatory changes. Spleen: Normal in size without focal abnormality. Adrenals/Urinary Tract: Limited evaluation of the bladder secondary to streak artifact in the pelvis. The kidneys appears stable. There is a cyst in the left kidney measuring 13 mm. No hydronephrosis or perinephric fat stranding. The adrenal glands are within normal limits. Stomach/Bowel: No evidence for bowel obstruction, pneumatosis or free air. There is wall thickening and mild inflammatory stranding of the sigmoid colon to the level the rectum. Stomach is decompressed and not well evaluated. There is  also some mild wall thickening of jejunal loops in the left abdomen. The appendix is not visualized. Vascular/Lymphatic: Aortic atherosclerosis. No enlarged abdominal or pelvic lymph nodes. Reproductive: Status post hysterectomy. No adnexal masses. Other: There is a moderate-to-large amount of ascites throughout the abdomen and pelvis which has increased from prior. Peritoneal nodularity in the right abdomen measuring up to 11 mm and in the anterior left abdomen measuring up to 5 mm appears unchanged. Small scattered nodules or lymph nodes in the pelvis adjacent to the sigmoid colon have not significantly changed. Musculoskeletal: Right hip arthroplasty is present. No acute fracture or focal osseous lesion. IMPRESSION: 1. Wall thickening and inflammatory stranding of the sigmoid colon and rectum compatible with colitis/proctitis. 2. Mild wall thickening of jejunal loops in the left abdomen compatible with enteritis. 3. Moderate-to-large amount of ascites throughout the abdomen and pelvis has increased from prior. 4. Stable peritoneal nodularity compatible with metastatic disease. 5. Rounded low-attenuation area in the right lung base along the hemidiaphragm has slightly increased in size. This may represent small diaphragmatic hernia containing ascites or low-density nodule. Aortic Atherosclerosis (ICD10-I70.0). Electronically Signed   By: ARonney AstersM.D.   On: 11/06/2022 16:18   CT CHEST ABDOMEN PELVIS W CONTRAST  Result Date: 10/29/2022 CLINICAL DATA:  Ovarian cancer; * Tracking Code: BO * EXAM: CT CHEST, ABDOMEN, AND PELVIS WITH CONTRAST TECHNIQUE: Multidetector CT imaging of the chest, abdomen and pelvis was performed following the standard protocol during bolus administration of intravenous contrast. RADIATION DOSE REDUCTION: This exam was performed according to the departmental dose-optimization program which includes automated exposure control, adjustment of the mA and/or kV according to patient size  and/or use of iterative reconstruction technique. CONTRAST:  1063mOMNIPAQUE IOHEXOL 300 MG/ML  SOLN COMPARISON:  CT abdomen and pelvis dated August 24, 2022; CT chest, abdomen and pelvis dated August 23, 2021 FINDINGS: CT CHEST FINDINGS Cardiovascular: Normal heart size. No pericardial effusion. Normal caliber thoracic aorta with moderate atherosclerotic disease. No coronary artery calcifications. Mediastinum/Nodes: Small hiatal hernia. Thyroid is unremarkable. No pathologically enlarged lymph nodes seen in the chest. Lungs/Pleura: Central airways are patent. Mild bibasilar atelectasis. No consolidation, pleural effusion or pneumothorax. Subpleural nodule overlying the right hemidiaphragm is increased in size measuring 2.0 x 1.3 cm on series 6,  image 100, previously measured up to 3 mm. Musculoskeletal: Stable sclerotic lesion of the left scapula, likely a bone island. No aggressive appearing osseous lesions seen in the region of the chest. CT ABDOMEN PELVIS FINDINGS Hepatobiliary: No focal liver abnormality is seen. No gallstones, gallbladder wall thickening, or biliary dilatation. Pancreas: Unremarkable. No pancreatic ductal dilatation or surrounding inflammatory changes. Spleen: Stable splenomegaly Adrenals/Urinary Tract: Bilateral adrenal glands are unremarkable. No hydronephrosis or nephrolithiasis. No suspicious renal lesions. Bladder is unremarkable. Stomach/Bowel: Stomach is within normal limits. Diverticulosis. No evidence of bowel wall thickening, distention, or inflammatory changes. Vascular/Lymphatic: No significant vascular findings are present. No enlarged abdominal or pelvic lymph nodes. Reproductive: No adnexal masses. Other: Trace abdominal ascites and peritoneal nodularity reference peritoneal nodule of the right hemiabdomen measuring 9 mm on series 2, image 77, unchanged. Stable pelvic soft tissue nodule measuring 2.0 x 1.8 cm on series 4, image 65, unchanged when compared with prior and  remeasured in similar plane. Musculoskeletal: Prior total right hip replacement. Previously described fluid collection of the right gluteal muscles is longer visualized. No aggressive appearing osseous lesions. IMPRESSION: 1. Increased size of subpleural nodule overlying the right hemidiaphragm, concerning for progressive metastatic disease. 2. Trace abdominal ascites with stable peritoneal nodularity. Electronically Signed   By: Yetta Glassman M.D.   On: 10/29/2022 13:32

## 2022-11-07 NOTE — Progress Notes (Signed)
Pharmacy Antibiotic Note  Kayla Baker is a 86 y.o. female admitted on 11/06/2022 with sepsis. Patient with significant neutropenia. 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 Pharmacy has been consulted for vanc/cefepime dosing.  Plan: -Continue vancomycin 750 mg IV q24h -Continue cefepime 2 g IV q12h -Monitor renal function, cultures and clinical progress for dose adjustments and de-escalation as indicated   Height: 5' 4"  (162.6 cm) Weight: 66.7 kg (147 lb) IBW/kg (Calculated) : 54.7  Temp (24hrs), Avg:98.3 F (36.8 C), Min:97.8 F (36.6 C), Max:98.4 F (36.9 C)  Recent Labs  Lab 11/06/22 1344 11/06/22 1345 11/06/22 1737 11/07/22 0409  WBC 0.7*  --   --  1.8*  CREATININE 1.01*  --   --   --   LATICACIDVEN  --  2.3* 2.3*  --      Estimated Creatinine Clearance: 38.9 mL/min (A) (by C-G formula based on SCr of 1.01 mg/dL (H)).    Allergies  Allergen Reactions   Mobic [Meloxicam] Swelling   Prednisone Swelling and Other (See Comments)    Can Not take High doses   Premarin [Estrogens Conjugated] Hives   Trovan [Alatrofloxacin] Other (See Comments)    "I don't tolerate this well"   Antimicrobials: Cefepime 12/7 >> Vancomycin 12/7 >>  Culture Data: 12/7 Bcx: ngtd 12/7 Ucx: pending  Thank you for allowing pharmacy to be a part of this patient's care.  Tawnya Crook, PharmD, BCPS Clinical Pharmacist 11/07/2022 10:00 AM

## 2022-11-07 NOTE — Evaluation (Signed)
Physical Therapy Evaluation Patient Details Name: Kayla [facility] MRN: [id number] DOB: 05-12-1936 Today's Date: 11/07/2022  History of Present Illness  86 y.o. 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 Dx of sepsis presumed 2* neutropenia.  Clinical Impression  Pt ambulated 150' with RW without loss of balance, distance limited by fatigue. She did not require physical assistance for bed mobility nor for transfers. She is mobilizing well enough to DC home. No further acute PT indicated. PT signing off. Will plan to have mobility specialist see pt for ambulation to minimize deconditioning during hospitalization.        Recommendations for follow up therapy are one component of a multi-disciplinary discharge planning process, led by the attending physician.  Recommendations may be updated based on patient status, additional functional criteria and insurance authorization.  Follow Up Recommendations No PT follow up      Assistance Recommended at Discharge Set up Supervision/Assistance  Patient can return home with the following  A little help with bathing/dressing/bathroom;Assistance with cooking/housework;Assist for transportation;Help with stairs or ramp for entrance    Equipment Recommendations None recommended by PT  Recommendations for Other Services       Functional Status Assessment Patient has not had a recent decline in their functional status     Precautions / Restrictions Precautions Precautions: Fall Precaution Comments: pt denies h/o falls  in past 6 months Restrictions Weight Bearing Restrictions: No      Mobility  Bed Mobility Overal bed mobility: Modified Independent                  Transfers Overall transfer level: Needs assistance Equipment used: Rolling walker (2 wheels) Transfers: Sit to/from Stand Sit to Stand: Supervision                Ambulation/Gait Ambulation/Gait assistance: Supervision Gait Distance (Feet): 150 Feet Assistive device: Rolling walker (2 wheels) Gait Pattern/deviations: Step-through pattern, Decreased stride length Gait velocity: decr     General Gait Details: steady, no loss of balance, distance limited by fatigue  Stairs            Wheelchair Mobility    Modified Rankin (Stroke Patients Only)       Balance Overall balance assessment: Needs assistance   Sitting balance-Leahy Scale: Good     Standing balance support: During functional activity, Bilateral upper extremity supported, Reliant on assistive device for balance Standing balance-Leahy Scale: Fair                               Pertinent Vitals/Pain Pain Assessment Pain Assessment: 0-10 Pain Score: 4  Pain Location: abdomen Pain Descriptors / Indicators: Tightness Pain Intervention(s): Limited activity within patient's tolerance, Monitored during session    Home Living Family/patient expects to be discharged to:: Private residence Living Arrangements: Spouse/significant other Available Help at Discharge: Family;Available 24 hours/day Type of Home: House Home Access: Stairs to enter Entrance Stairs-Rails: Right Entrance Stairs-Number of Steps: 2   Home Layout: One level Home Equipment: Conservation officer, nature (2 wheels)      Prior Function Prior Level of Function : Independent/Modified Independent  Mobility Comments: indep with mobility, with Rw, spouse assisted  legs in and out of bed ADLs Comments: spouse assisted with ADL's since hip pain     Hand  Dominance        Extremity/Trunk Assessment   Upper Extremity Assessment Upper Extremity Assessment: Overall WFL for tasks assessed    Lower Extremity Assessment Lower Extremity Assessment: RLE deficits/detail;LLE deficits/detail RLE Deficits / Details: edema and erythema noted R lower leg, knee ext -4/5 RLE Sensation: history of peripheral neuropathy LLE Deficits / Details: knee ext +4/5 LLE Sensation: history of peripheral neuropathy    Cervical / Trunk Assessment Cervical / Trunk Assessment: Normal  Communication   Communication: No difficulties  Cognition Arousal/Alertness: Awake/alert Behavior During Therapy: WFL for tasks assessed/performed Overall Cognitive Status: Within Functional Limits for tasks assessed                                          General Comments      Exercises     Assessment/Plan    PT Assessment Patient does not need any further PT services  PT Problem List         PT Treatment Interventions      PT Goals (Current goals can be found in the Care Plan section)  Acute Rehab PT Goals PT Goal Formulation: All assessment and education complete, DC therapy    Frequency       Co-evaluation               AM-PAC PT "6 Clicks" Mobility  Outcome Measure Help needed turning from your back to your side while in a flat bed without using bedrails?: None Help needed moving from lying on your back to sitting on the side of a flat bed without using bedrails?: None Help needed moving to and from a bed to a chair (including a wheelchair)?: None Help needed standing up from a chair using your arms (e.g., wheelchair or bedside chair)?: None Help needed to walk in hospital room?: None Help needed climbing 3-5 steps with a railing? : A Little 6 Click Score: 23    End of Session   Activity Tolerance: Patient tolerated treatment well Patient left: in bed;with call bell/phone within reach;with family/visitor present Nurse  Communication: Mobility status      Time: 7253-6644 PT Time Calculation (min) (ACUTE ONLY): 16 min   Charges:   PT Evaluation $PT Eval Moderate Complexity: 1 Mod          Philomena Doheny PT 11/07/2022  Acute Rehabilitation Services  Office [phone]

## 2022-11-07 NOTE — Progress Notes (Signed)
PROGRESS NOTE    Kayla Baker  WUJ:[id number] DOB: May 11, 1936 DOA: 11/06/2022 PCP: [name], MD    Brief Narrative:  86 y.o. 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  Husband at the bedside.  Patient is also a good historian.  Patient tells me that for the last 2 days she feels like she is repeating tasks, her husband noticed that she has been confused  In the Emergency room,  Afebrile.  Blood pressure stable.  Severely neutropenic with WBC count of 700 with ANC of 0.1.  Lactic acid mildly elevated.  Patient otherwise hemodynamically stable. CT head was without acute findings. CT abdomen pelvis with moderate ascites, inflammatory stranding on the left side of the colon likely reactive due to ascites.  Patient without diarrhea. Blood cultures were drawn and patient started on broad-spectrum antibiotics given severe neutropenia and suspected sepsis of unknown source.  Patient does have a port on her right chest.  Assessment & Plan:   Sepsis, presumed due to severe neutropenia, altered mental status.  Multiple possible source of infection. 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  Blood cultures, urine cultures,negative so far.  Continue broad-spectrum antibiotics with vancomycin and cefepime due to severe neutropenia. Paracentesis for diagnostic tap would be useful but holding off due to risk of introducing infection with severe neutropenia and bleeding complication  from severe thrombocytopenia. Once her counts improve, may attempt paracentesis.  Metabolic encephalopathy, infective metabolic encephalopathy: Nonfocal exam.  Mental status stable. B12 normal, on replacement. Folic acid, low.  Will replace. Hypomagnesemia, replace aggressively Hypophosphatemia, replace aggressively Ammonia was normal. CT head was normal. Monitor.  Mental status improving.   Ovarian cancer on Taxol, severe pancytopenia: Oncology following.  Patient had received Neulasta after her chemotherapy last week.  Severe chemotherapy induced complications, not candidate for further chemo as per oncology.  Started on Granix today.  Essential hypertension: Blood pressure stable.  She is at risk of hypotension.  Continue beta-blockers but hold other antihypertensives.   Type 2 diabetes, well-controlled: She is on glipizide at home.  Keep on very low-dose sliding scale insulin.  Case discussed with Oncology . Decided to continue supportive measures, stabilization and recommending home with palliative or hospice. Palliative care team involved.    DVT prophylaxis: SCDs Start: 11/06/22 2108   Code Status: full code  Family Communication: husband at the bed side. Daughter at the bed side.  Disposition Plan: Status is: Inpatient Remains inpatient appropriate because: Low blood counts, broad-spectrum antibiotics,     Consultants:  Oncology Palliative care  Procedures:  None  Antimicrobials:  Vancomycin and cefepime 12/7---   Subjective: Patient seen and examined.  Husband and daughter at the bedside.  Still in the emergency room.  Denies any complaints.  Denies any nausea or vomiting.  She had just talked to her oncologist and was processing information.  No other overnight events.  Afebrile.  Objective: Vitals:   11/07/22 0619 11/07/22 0900 11/07/22 0928 11/07/22 1021  BP:  (!) 142/100  (!) 135/56  Pulse:  86  83  Resp:  20  16  Temp: 98.4 F (36.9 C)  97.8 F  (36.6 C) 98.4 F (36.9 C)  TempSrc: Oral  Oral Oral  SpO2:  99%  99%  Weight:      Height:        Intake/Output Summary (Last 24 hours) at 11/07/2022 1256 Last data filed at 11/06/2022 1942 Gross per 24 hour  Intake 250 ml  Output --  Net 250 ml   Filed Weights   11/06/22 1821  Weight: 66.7 kg    Examination:  General exam: Appears calm and comfortable  Frail and debilitated.  Pale looking.  Not in distress. Respiratory system: No added sounds. Cardiovascular system: S1 & S2 heard, RRR.  Port-A-Cath on the right chest wall. Gastrointestinal system: Abdomen is mildly distended but soft and nontender.   Central nervous system: Alert and oriented. No focal neurological deficits. Extremities: Symmetric 5 x 5 power.  Bilateral pedal edema.  Right more than left.    Data Reviewed: I have personally reviewed following labs and imaging studies  CBC: Recent Labs  Lab 11/06/22 1344 11/07/22 0409  WBC 0.7* 1.8*  NEUTROABS 0.1* 0.5*  HGB 9.6* 8.9*  HCT 30.1* 27.5*  MCV 98.4 98.2  PLT 54* 49*   Basic Metabolic Panel: Recent Labs  Lab 11/06/22 1344 11/06/22 1737  NA 139  --   K 3.2*  --   CL 108  --   CO2 25  --   GLUCOSE 136*  --   BUN 32*  --   CREATININE 1.01*  --   CALCIUM 8.7*  --   MG  --  1.3*  PHOS  --  1.7*   GFR: Estimated Creatinine Clearance: 38.9 mL/min (A) (by C-G formula based on SCr of 1.01 mg/dL (H)). Liver Function Tests: Recent Labs  Lab 11/06/22 1344  AST 34  ALT 24  ALKPHOS 96  BILITOT 1.2  PROT 5.9*  ALBUMIN 2.6*   Recent Labs  Lab 11/06/22 1344  LIPASE 29   Recent Labs  Lab 11/06/22 1344  AMMONIA 29   Coagulation Profile: Recent Labs  Lab 11/06/22 1344  INR 1.3*   Cardiac Enzymes: No results for input(s): "CKTOTAL", "CKMB", "CKMBINDEX", "TROPONINI" in the last 168 hours. BNP (last 3 results) No results for input(s): "PROBNP" in the last 8760 hours. HbA1C: No results for input(s): "HGBA1C" in the last 72  hours. CBG: Recent Labs  Lab 11/06/22 2201 11/07/22 0831 11/07/22 1141  GLUCAP 119* 129* 148*   Lipid Profile: No results for input(s): "CHOL", "HDL", "LDLCALC", "TRIG", "CHOLHDL", "LDLDIRECT" in the last 72 hours. Thyroid Function Tests: Recent Labs    11/06/22 1737  TSH 1.423   Anemia Panel: Recent Labs    11/06/22 1737  VITAMINB12 3,138*  FOLATE 5.3*   Sepsis Labs: Recent Labs  Lab 11/06/22 1345 11/06/22 1737  LATICACIDVEN 2.3* 2.3*    Recent Results (from the past 240 hour(s))  Blood culture (routine x 2)     Status: None (Preliminary result)   Collection Time: 11/06/22  5:30 PM   Specimen: BLOOD  Result Value Ref Range Status   Specimen Description   Final    BLOOD BLOOD RIGHT HAND Performed at [facility] [address], [place], [place] [address]    Special Requests   Final    BOTTLES DRAWN AEROBIC AND ANAEROBIC Blood Culture adequate volume Performed at [facility] [address], [place], [place] [address]    Culture   Final    NO GROWTH < 12 HOURS Performed at  [facility], [place] [address] [place], [place] [address]    Report Status PENDING  Incomplete  Blood culture (routine x 2)     Status: None (Preliminary result)   Collection Time: 11/06/22  5:30 PM   Specimen: BLOOD  Result Value Ref Range Status   Specimen Description   Final    BLOOD RIGHT ANTECUBITAL Performed at [facility] [address], [place], [place] [address]    Special Requests   Final    BOTTLES DRAWN AEROBIC AND ANAEROBIC Blood Culture results may not be optimal due to an excessive volume of blood received in culture bottles Performed at [facility] [address] [place], [place] [address]    Culture   Final    NO GROWTH < 12 HOURS Performed at [facility] [address] [place], [place] [address]    Report Status PENDING  Incomplete         Radiology Studies: CT  Head Wo Contrast  Result Date: 11/06/2022 CLINICAL DATA:  Provided history: Headache, sudden, severe. EXAM: CT HEAD WITHOUT CONTRAST TECHNIQUE: Contiguous axial images were obtained from the base of the skull through the vertex without intravenous contrast. RADIATION DOSE REDUCTION: This exam was performed according to the departmental dose-optimization program which includes automated exposure control, adjustment of the mA and/or kV according to patient size and/or use of iterative reconstruction technique. COMPARISON:  Head CT 08/22/2022. Brain MRI 09/24/2006. FINDINGS: Brain: Mild-to-moderate generalized cerebral atrophy. Mild patchy and ill-defined hypoattenuation within the cerebral white matter, nonspecific but compatible with chronic small vessel ischemic disease. There is no acute intracranial hemorrhage. No demarcated cortical infarct. No extra-axial fluid collection. No evidence of an intracranial mass. No midline shift. Vascular: No hyperdense vessel.  Atherosclerotic calcifications. Skull: No fracture or aggressive osseous lesion. Sinuses/Orbits: No mass or acute finding within the imaged orbits. No significant paranasal sinus disease at the imaged levels Other: Partially imaged chronic bilateral frontal process of maxilla fractures. IMPRESSION: No evidence of acute intracranial abnormality. Mild chronic small vessel ischemic changes within the cerebral white matter. Mild-to-moderate generalized cerebral atrophy. Electronically Signed   By: Kellie Simmering D.O.   On: 11/06/2022 16:21   CT ABDOMEN PELVIS W CONTRAST  Result Date: 11/06/2022 CLINICAL DATA:  Acute abdominal pain and distension. Black stool. History of ovarian cancer. EXAM: CT ABDOMEN AND PELVIS WITH CONTRAST TECHNIQUE: Multidetector CT imaging of the abdomen and pelvis was performed using the standard protocol following bolus administration of intravenous contrast. RADIATION DOSE REDUCTION: This exam was performed according to the  departmental dose-optimization program which includes automated exposure control, adjustment of the mA and/or kV according to patient size and/or use of iterative reconstruction technique. CONTRAST:  150m OMNIPAQUE IOHEXOL 300 MG/ML  SOLN COMPARISON:  CT abdomen and pelvis 10/28/2022 FINDINGS: Lower chest: No acute abnormality. Rounded low-attenuation area in the right lung base along the hemidiaphragm has slightly increased in size measuring up to 2 cm (previously 1.4 cm. This may represent small diaphragmatic hernia containing ascites or low-density nodule. Hepatobiliary: No focal liver abnormality is seen. No gallstones, gallbladder wall thickening, or biliary dilatation. Pancreas: Unremarkable. No pancreatic ductal dilatation or surrounding inflammatory changes. Spleen: Normal in size without focal abnormality. Adrenals/Urinary Tract: Limited evaluation of the bladder secondary to streak artifact in the pelvis. The kidneys appears stable. There is a cyst in the left kidney measuring 13 mm. No hydronephrosis or perinephric fat stranding. The adrenal glands are within normal limits. Stomach/Bowel: No  evidence for bowel obstruction, pneumatosis or free air. There is wall thickening and mild inflammatory stranding of the sigmoid colon to the level the rectum. Stomach is decompressed and not well evaluated. There is also some mild wall thickening of jejunal loops in the left abdomen. The appendix is not visualized. Vascular/Lymphatic: Aortic atherosclerosis. No enlarged abdominal or pelvic lymph nodes. Reproductive: Status post hysterectomy. No adnexal masses. Other: There is a moderate-to-large amount of ascites throughout the abdomen and pelvis which has increased from prior. Peritoneal nodularity in the right abdomen measuring up to 11 mm and in the anterior left abdomen measuring up to 5 mm appears unchanged. Small scattered nodules or lymph nodes in the pelvis adjacent to the sigmoid colon have not  significantly changed. Musculoskeletal: Right hip arthroplasty is present. No acute fracture or focal osseous lesion. IMPRESSION: 1. Wall thickening and inflammatory stranding of the sigmoid colon and rectum compatible with colitis/proctitis. 2. Mild wall thickening of jejunal loops in the left abdomen compatible with enteritis. 3. Moderate-to-large amount of ascites throughout the abdomen and pelvis has increased from prior. 4. Stable peritoneal nodularity compatible with metastatic disease. 5. Rounded low-attenuation area in the right lung base along the hemidiaphragm has slightly increased in size. This may represent small diaphragmatic hernia containing ascites or low-density nodule. Aortic Atherosclerosis (ICD10-I70.0). Electronically Signed   By: Ronney Asters M.D.   On: 11/06/2022 16:18        Scheduled Meds:  allopurinol  100 mg Oral Daily   ascorbic acid  500 mg Oral QPM   aspirin EC  81 mg Oral BID   cholecalciferol  5,000 Units Oral Daily   cyanocobalamin  1,000 mcg Oral Daily   gabapentin  300 mg Oral QID   insulin aspart  0-5 Units Subcutaneous QHS   insulin aspart  0-6 Units Subcutaneous TID WC   metoprolol succinate  25 mg Oral Daily   pantoprazole  40 mg Oral Daily   potassium chloride  40 mEq Oral BID   Ensure Max Protein  11 oz Oral BID   Tbo-filgastrim (GRANIX) SQ  300 mcg Subcutaneous Daily   Continuous Infusions:  sodium chloride 75 mL/hr at 11/07/22 0547   ceFEPime (MAXIPIME) IV Stopped (11/07/22 0219)   potassium PHOSPHATE IVPB (in mmol) 30 mmol (11/07/22 1050)   vancomycin       LOS: 1 day    Time spent: 35 minutes    Barb Merino, MD [facility] Pager [phone]

## 2022-11-08 DIAGNOSIS — D61818 Other pancytopenia: Secondary | ICD-10-CM | POA: Diagnosis not present

## 2022-11-08 DIAGNOSIS — A419 Sepsis, unspecified organism: Secondary | ICD-10-CM | POA: Diagnosis not present

## 2022-11-08 DIAGNOSIS — Z515 Encounter for palliative care: Secondary | ICD-10-CM | POA: Diagnosis not present

## 2022-11-08 DIAGNOSIS — C569 Malignant neoplasm of unspecified ovary: Secondary | ICD-10-CM | POA: Diagnosis not present

## 2022-11-08 DIAGNOSIS — T8459XD Infection and inflammatory reaction due to other internal joint prosthesis, subsequent encounter: Secondary | ICD-10-CM

## 2022-11-08 DIAGNOSIS — G620 Drug-induced polyneuropathy: Secondary | ICD-10-CM | POA: Diagnosis not present

## 2022-11-08 DIAGNOSIS — Z96649 Presence of unspecified artificial hip joint: Secondary | ICD-10-CM

## 2022-11-08 DIAGNOSIS — Z7189 Other specified counseling: Secondary | ICD-10-CM

## 2022-11-08 DIAGNOSIS — K529 Noninfective gastroenteritis and colitis, unspecified: Secondary | ICD-10-CM | POA: Diagnosis not present

## 2022-11-08 DIAGNOSIS — R4589 Other symptoms and signs involving emotional state: Secondary | ICD-10-CM

## 2022-11-08 LAB — CBC WITH DIFFERENTIAL/PLATELET
Abs Immature Granulocytes: 2.43 10*3/uL — ABNORMAL HIGH (ref 0.00–0.07)
Basophils Absolute: 0 10*3/uL (ref 0.0–0.1)
Basophils Relative: 0 %
Eosinophils Absolute: 0.1 10*3/uL (ref 0.0–0.5)
Eosinophils Relative: 0 %
HCT: 27.9 % — ABNORMAL LOW (ref 36.0–46.0)
Hemoglobin: 8.7 g/dL — ABNORMAL LOW (ref 12.0–15.0)
Immature Granulocytes: 12 %
Lymphocytes Relative: 10 %
Lymphs Abs: 2 10*3/uL (ref 0.7–4.0)
MCH: 31.1 pg (ref 26.0–34.0)
MCHC: 31.2 g/dL (ref 30.0–36.0)
MCV: 99.6 fL (ref 80.0–100.0)
Monocytes Absolute: 1.7 10*3/uL — ABNORMAL HIGH (ref 0.1–1.0)
Monocytes Relative: 8 %
Neutro Abs: 14.8 10*3/uL — ABNORMAL HIGH (ref 1.7–7.7)
Neutrophils Relative %: 70 %
Platelets: 72 10*3/uL — ABNORMAL LOW (ref 150–400)
RBC: 2.8 MIL/uL — ABNORMAL LOW (ref 3.87–5.11)
RDW: 17.2 % — ABNORMAL HIGH (ref 11.5–15.5)
WBC: 21 10*3/uL — ABNORMAL HIGH (ref 4.0–10.5)
nRBC: 0.6 % — ABNORMAL HIGH (ref 0.0–0.2)

## 2022-11-08 LAB — GLUCOSE, CAPILLARY
Glucose-Capillary: 118 mg/dL — ABNORMAL HIGH (ref 70–99)
Glucose-Capillary: 147 mg/dL — ABNORMAL HIGH (ref 70–99)
Glucose-Capillary: 148 mg/dL — ABNORMAL HIGH (ref 70–99)
Glucose-Capillary: 165 mg/dL — ABNORMAL HIGH (ref 70–99)

## 2022-11-08 LAB — CREATININE, SERUM
Creatinine, Ser: 1.13 mg/dL — ABNORMAL HIGH (ref 0.44–1.00)
GFR, Estimated: 48 mL/min — ABNORMAL LOW (ref >60)

## 2022-11-08 LAB — URINE CULTURE: Culture: 20000 — AB

## 2022-11-08 LAB — PHOSPHORUS: Phosphorus: 2 mg/dL — ABNORMAL LOW (ref 2.5–4.6)

## 2022-11-08 MED ORDER — FOLIC ACID 1 MG PO TABS
1.0000 mg | ORAL_TABLET | Freq: Every day | ORAL | Status: DC
  Administered 2022-11-08 – 2022-11-10 (×3): 1 mg via ORAL
  Filled 2022-11-08 (×3): qty 1

## 2022-11-08 MED ORDER — CHLORHEXIDINE GLUCONATE CLOTH 2 % EX PADS
6.0000 | MEDICATED_PAD | Freq: Every day | CUTANEOUS | Status: DC
  Administered 2022-11-08 – 2022-11-10 (×3): 6 via TOPICAL

## 2022-11-08 MED ORDER — CEFADROXIL 500 MG PO CAPS
1000.0000 mg | ORAL_CAPSULE | Freq: Two times a day (BID) | ORAL | Status: DC
  Administered 2022-11-08 – 2022-11-10 (×5): 1000 mg via ORAL
  Filled 2022-11-08 (×5): qty 2

## 2022-11-08 MED ORDER — METRONIDAZOLE 500 MG PO TABS
500.0000 mg | ORAL_TABLET | Freq: Two times a day (BID) | ORAL | Status: DC
  Administered 2022-11-08 – 2022-11-10 (×5): 500 mg via ORAL
  Filled 2022-11-08 (×5): qty 1

## 2022-11-08 MED ORDER — LOPERAMIDE HCL 2 MG PO CAPS
2.0000 mg | ORAL_CAPSULE | ORAL | Status: DC | PRN
  Administered 2022-11-08: 2 mg via ORAL
  Filled 2022-11-08: qty 1

## 2022-11-08 MED ORDER — POTASSIUM PHOSPHATES 15 MMOLE/5ML IV SOLN
30.0000 mmol | Freq: Once | INTRAVENOUS | Status: AC
  Administered 2022-11-08: 30 mmol via INTRAVENOUS
  Filled 2022-11-08: qty 10

## 2022-11-08 MED ORDER — HYDROCORTISONE 1 % EX CREA
TOPICAL_CREAM | Freq: Three times a day (TID) | CUTANEOUS | Status: DC
  Filled 2022-11-08: qty 28

## 2022-11-08 NOTE — Progress Notes (Signed)
PROGRESS NOTE    KESTREL MIS  YIF:[id number] DOB: 06-25-1936 DOA: 11/06/2022 PCP: [name], MD    Brief Narrative:  86 y.o. 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  Husband at the bedside.  Patient is also a good historian.  Patient tells me that for the last 2 days she feels like she is repeating tasks, her husband noticed that she has been confused  In the Emergency room,  Afebrile.  Blood pressure stable.  Severely neutropenic with WBC count of 700 with ANC of 0.1.  Lactic acid mildly elevated.  Patient otherwise hemodynamically stable. CT head was without acute findings. CT abdomen pelvis with moderate ascites, inflammatory stranding on the left side of the colon likely reactive due to ascites.  Patient without diarrhea. Blood cultures were drawn and patient started on broad-spectrum antibiotics given severe neutropenia and suspected sepsis of unknown source.  Patient does have a port on her right chest.  Assessment & Plan:   Sepsis, presumed due to presence of severe neutropenia, altered mental status.  Multiple possible source of infection. 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 Left-sided colitis seen on CT scan.  No change in bowel habits.  Blood cultures, negative so far. Urine culture, 20,000 colonies of Klebsiella. Currently on broad-spectrum antibiotics with vancomycin and cefepime, neutrophil counts are adequate.  With no identifiable source of  infection, discontinue all IV antibiotics. Patient will go back on cefadroxil 1 g twice daily that she takes as chronic suppressive therapy.  Will treat with 5 additional days of Flagyl for suspected colitis, however this may be secondary to edema of the walls from ascites. Diagnostic paracentesis was discussed and delayed due to risk of introducing infection and bleeding from low blood counts.  Platelet counts are appropriately improving.  Metabolic encephalopathy, infective metabolic encephalopathy: Nonfocal exam.  Mental status stable and normalized. B12 normal, on replacement. Folic acid, low.  Started on oral replacement. Hypomagnesemia, replace aggressively.  Recheck tomorrow. Hypophosphatemia, additional phosphate today.  Recheck tomorrow. Ammonia was normal. CT head was normal.   Ovarian cancer on Taxol, severe pancytopenia: Oncology following.  Patient had received Neulasta after her chemotherapy last week.  Severe chemotherapy induced complications, not candidate for further chemo as per oncology.  Started on Granix.  WBC count overcorrected.  Discontinue Granix.  Recheck tomorrow morning.  Essential hypertension: Blood pressure stable.  She is at risk of hypotension.  Continue beta-blockers but hold other antihypertensives.   Type 2 diabetes, well-controlled: She is on glipizide at home.  Keep on very low-dose sliding scale insulin.  Case discussed with Oncology . Decided to continue supportive measures, stabilization and recommending home with palliative or hospice.   Goal of care: Discussed with patient and husband at the bedside.  Patient had good understanding of oncologist recommendation about hospice level of care at home.  Involved palliative care team to further educate and coordinate. Patient and her husband are in agreement with stopping chemotherapy but they were not quite ready to change CODE STATUS to no CPR and start hospice services at home.  Will continue to  discuss. Patient would like  to touch base with her stepdaughter and discuss.   DVT prophylaxis: SCDs Start: 11/06/22 2108   Code Status: full code  Family Communication: husband at the bed side.  Disposition Plan: Status is: Inpatient Remains inpatient appropriate because: Antibiotics.  Monitoring.     Consultants:  Oncology Palliative care  Procedures:  None  Antimicrobials:  Vancomycin and cefepime 12/7--- 12/9 Cefadroxil, long-term treatment Flagyl, 12/9---   Subjective:  Patient seen and examined.  No overnight events.  She tells me that her belly is better and not as bloated as before.  Tolerating oral intake.  Afebrile.  She is aware about oncologist recommendation and wants to discuss with palliative care team.  Walking in the hallway with mobility technician.  Objective: Vitals:   11/07/22 1021 11/07/22 1434 11/07/22 2100 11/08/22 0525  BP: (!) 135/56 (!) 113/56 (!) 110/55 (!) 114/55  Pulse: 83 83 89 95  Resp: 16 17 16 16   Temp: 98.4 F (36.9 C) 98 F (36.7 C) 98 F (36.7 C) 98.1 F (36.7 C)  TempSrc: Oral Oral Oral Oral  SpO2: 99% 98% 94% 91%  Weight:      Height:        Intake/Output Summary (Last 24 hours) at 11/08/2022 1337 Last data filed at 11/08/2022 1303 Gross per 24 hour  Intake 3179.6 ml  Output --  Net 3179.6 ml   Filed Weights   11/06/22 1821  Weight: 66.7 kg    Examination:  General exam: Appears calm and comfortable  Frail and debilitated.  Pale looking.  Not in distress.  Able to talk in full sentences. Respiratory system: No added sounds. Cardiovascular system: S1 & S2 heard, RRR.  Port-A-Cath on the right chest wall. Gastrointestinal system: Abdomen is mildly distended but soft and nontender.   Central nervous system: Alert and oriented. No focal neurological deficits. Extremities: Symmetric 5 x 5 power.  Bilateral pedal edema.  Right more than left.    Data Reviewed: I have personally reviewed following labs and imaging  studies  CBC: Recent Labs  Lab 11/06/22 1344 11/07/22 0409 11/08/22 0910  WBC 0.7* 1.8* 21.0*  NEUTROABS 0.1* 0.5* 14.8*  HGB 9.6* 8.9* 8.7*  HCT 30.1* 27.5* 27.9*  MCV 98.4 98.2 99.6  PLT 54* 49* 72*   Basic Metabolic Panel: Recent Labs  Lab 11/06/22 1344 11/06/22 1737 11/08/22 0910  NA 139  --   --   K 3.2*  --   --   CL 108  --   --   CO2 25  --   --   GLUCOSE 136*  --   --   BUN 32*  --   --   CREATININE 1.01*  --  1.13*  CALCIUM 8.7*  --   --   MG  --  1.3*  --   PHOS  --  1.7* 2.0*   GFR: Estimated Creatinine Clearance: 34.8 mL/min (A) (by C-G formula based on SCr of 1.13 mg/dL (H)). Liver Function Tests: Recent Labs  Lab 11/06/22 1344  AST 34  ALT 24  ALKPHOS 96  BILITOT 1.2  PROT 5.9*  ALBUMIN 2.6*   Recent Labs  Lab 11/06/22 1344  LIPASE 29   Recent Labs  Lab 11/06/22 1344  AMMONIA 29   Coagulation Profile: Recent Labs  Lab 11/06/22 1344  INR 1.3*   Cardiac Enzymes: No results for input(s): "CKTOTAL", "CKMB", "CKMBINDEX", "TROPONINI" in the last 168 hours. BNP (last 3 results) No results for input(s): "PROBNP" in the last 8760  hours. HbA1C: No results for input(s): "HGBA1C" in the last 72 hours. CBG: Recent Labs  Lab 11/07/22 1141 11/07/22 1645 11/07/22 2116 11/08/22 0742 11/08/22 1206  GLUCAP 148* 135* 115* 118* 147*   Lipid Profile: No results for input(s): "CHOL", "HDL", "LDLCALC", "TRIG", "CHOLHDL", "LDLDIRECT" in the last 72 hours. Thyroid Function Tests: Recent Labs    11/06/22 1737  TSH 1.423   Anemia Panel: Recent Labs    11/06/22 1737  VITAMINB12 3,138*  FOLATE 5.3*   Sepsis Labs: Recent Labs  Lab 11/06/22 1345 11/06/22 1737  LATICACIDVEN 2.3* 2.3*    Recent Results (from the past 240 hour(s))  Blood culture (routine x 2)     Status: None (Preliminary result)   Collection Time: 11/06/22  5:30 PM   Specimen: BLOOD  Result Value Ref Range Status   Specimen Description   Final    BLOOD BLOOD RIGHT  HAND Performed at [facility] [address], [place], [place] [address]    Special Requests   Final    BOTTLES DRAWN AEROBIC AND ANAEROBIC Blood Culture adequate volume Performed at [facility] [address] [place], [place] [address]    Culture   Final    NO GROWTH 2 DAYS Performed at [facility] [address], [place], [place] [address]    Report Status PENDING  Incomplete  Blood culture (routine x 2)     Status: None (Preliminary result)   Collection Time: 11/06/22  5:30 PM   Specimen: BLOOD  Result Value Ref Range Status   Specimen Description   Final    BLOOD RIGHT ANTECUBITAL Performed at [facility] [address], [place], [place] [address]    Special Requests   Final    BOTTLES DRAWN AEROBIC AND ANAEROBIC Blood Culture results may not be optimal due to an excessive volume of blood received in culture bottles Performed at [facility] [address], [place], [place] [address]    Culture   Final    NO GROWTH 2 DAYS Performed at [facility] [address], [place], [place] [address]    Report Status PENDING  Incomplete  Urine Culture     Status: Abnormal   Collection Time: 11/06/22  5:33 PM   Specimen: Urine, Clean Catch  Result Value Ref Range Status   Specimen Description   Final    URINE, CLEAN CATCH Performed at [facility], [place] [address] [place], [place] [address]    Special Requests   Final    NONE Performed at [facility], [place] [address] [place],  [address]    Culture (A)  Final    20,000 COLONIES/mL KLEBSIELLA OXYTOCA Confirmed Extended Spectrum Beta-Lactamase Producer (ESBL).  In bloodstream infections from ESBL organisms, carbapenems are preferred over piperacillin/tazobactam. They are shown to have a lower risk of mortality.    Report Status 11/08/2022 FINAL  Final   Organism ID, Bacteria KLEBSIELLA  OXYTOCA (A)  Final      Susceptibility   Klebsiella oxytoca - MIC*    AMPICILLIN >=32 RESISTANT Resistant     CEFAZOLIN >=64 RESISTANT Resistant     CEFEPIME 1 SENSITIVE Sensitive     CEFTRIAXONE 8 RESISTANT Resistant     CIPROFLOXACIN <=0.25 SENSITIVE Sensitive     GENTAMICIN <=1 SENSITIVE Sensitive     IMIPENEM 0.5 SENSITIVE Sensitive     NITROFURANTOIN 32 SENSITIVE Sensitive     TRIMETH/SULFA <=20  SENSITIVE Sensitive     AMPICILLIN/SULBACTAM >=32 RESISTANT Resistant     PIP/TAZO >=128 RESISTANT Resistant     * 20,000 COLONIES/mL KLEBSIELLA OXYTOCA         Radiology Studies: CT Head Wo Contrast  Result Date: 11/06/2022 CLINICAL DATA:  Provided history: Headache, sudden, severe. EXAM: CT HEAD WITHOUT CONTRAST TECHNIQUE: Contiguous axial images were obtained from the base of the skull through the vertex without intravenous contrast. RADIATION DOSE REDUCTION: This exam was performed according to the departmental dose-optimization program which includes automated exposure control, adjustment of the mA and/or kV according to patient size and/or use of iterative reconstruction technique. COMPARISON:  Head CT 08/22/2022. Brain MRI 09/24/2006. FINDINGS: Brain: Mild-to-moderate generalized cerebral atrophy. Mild patchy and ill-defined hypoattenuation within the cerebral white matter, nonspecific but compatible with chronic small vessel ischemic disease. There is no acute intracranial hemorrhage. No demarcated cortical infarct. No extra-axial fluid collection. No evidence of an intracranial mass. No midline shift. Vascular: No hyperdense vessel.  Atherosclerotic calcifications. Skull: No fracture or aggressive osseous lesion. Sinuses/Orbits: No mass or acute finding within the imaged orbits. No significant paranasal sinus disease at the imaged levels Other: Partially imaged chronic bilateral frontal process of maxilla fractures. IMPRESSION: No evidence of acute intracranial abnormality. Mild chronic  small vessel ischemic changes within the cerebral white matter. Mild-to-moderate generalized cerebral atrophy. Electronically Signed   By: Kellie Simmering D.O.   On: 11/06/2022 16:21   CT ABDOMEN PELVIS W CONTRAST  Result Date: 11/06/2022 CLINICAL DATA:  Acute abdominal pain and distension. Black stool. History of ovarian cancer. EXAM: CT ABDOMEN AND PELVIS WITH CONTRAST TECHNIQUE: Multidetector CT imaging of the abdomen and pelvis was performed using the standard protocol following bolus administration of intravenous contrast. RADIATION DOSE REDUCTION: This exam was performed according to the departmental dose-optimization program which includes automated exposure control, adjustment of the mA and/or kV according to patient size and/or use of iterative reconstruction technique. CONTRAST:  167m OMNIPAQUE IOHEXOL 300 MG/ML  SOLN COMPARISON:  CT abdomen and pelvis 10/28/2022 FINDINGS: Lower chest: No acute abnormality. Rounded low-attenuation area in the right lung base along the hemidiaphragm has slightly increased in size measuring up to 2 cm (previously 1.4 cm. This may represent small diaphragmatic hernia containing ascites or low-density nodule. Hepatobiliary: No focal liver abnormality is seen. No gallstones, gallbladder wall thickening, or biliary dilatation. Pancreas: Unremarkable. No pancreatic ductal dilatation or surrounding inflammatory changes. Spleen: Normal in size without focal abnormality. Adrenals/Urinary Tract: Limited evaluation of the bladder secondary to streak artifact in the pelvis. The kidneys appears stable. There is a cyst in the left kidney measuring 13 mm. No hydronephrosis or perinephric fat stranding. The adrenal glands are within normal limits. Stomach/Bowel: No evidence for bowel obstruction, pneumatosis or free air. There is wall thickening and mild inflammatory stranding of the sigmoid colon to the level the rectum. Stomach is decompressed and not well evaluated. There is also some  mild wall thickening of jejunal loops in the left abdomen. The appendix is not visualized. Vascular/Lymphatic: Aortic atherosclerosis. No enlarged abdominal or pelvic lymph nodes. Reproductive: Status post hysterectomy. No adnexal masses. Other: There is a moderate-to-large amount of ascites throughout the abdomen and pelvis which has increased from prior. Peritoneal nodularity in the right abdomen measuring up to 11 mm and in the anterior left abdomen measuring up to 5 mm appears unchanged. Small scattered nodules or lymph nodes in the pelvis adjacent to the sigmoid colon have not significantly changed. Musculoskeletal: Right hip arthroplasty is  present. No acute fracture or focal osseous lesion. IMPRESSION: 1. Wall thickening and inflammatory stranding of the sigmoid colon and rectum compatible with colitis/proctitis. 2. Mild wall thickening of jejunal loops in the left abdomen compatible with enteritis. 3. Moderate-to-large amount of ascites throughout the abdomen and pelvis has increased from prior. 4. Stable peritoneal nodularity compatible with metastatic disease. 5. Rounded low-attenuation area in the right lung base along the hemidiaphragm has slightly increased in size. This may represent small diaphragmatic hernia containing ascites or low-density nodule. Aortic Atherosclerosis (ICD10-I70.0). Electronically Signed   By: Ronney Asters M.D.   On: 11/06/2022 16:18        Scheduled Meds:  allopurinol  100 mg Oral Daily   ascorbic acid  500 mg Oral QPM   aspirin EC  81 mg Oral BID   cefadroxil  1,000 mg Oral BID   Chlorhexidine Gluconate Cloth  6 each Topical Daily   cholecalciferol  5,000 Units Oral Daily   cyanocobalamin  1,000 mcg Oral Daily   folic acid  1 mg Oral Daily   gabapentin  300 mg Oral QID   insulin aspart  0-5 Units Subcutaneous QHS   insulin aspart  0-6 Units Subcutaneous TID WC   metoprolol succinate  25 mg Oral Daily   metroNIDAZOLE  500 mg Oral Q12H   pantoprazole  40 mg  Oral Daily   Ensure Max Protein  11 oz Oral BID   Continuous Infusions:  potassium PHOSPHATE IVPB (in mmol)        LOS: 2 days    Time spent: 35 minutes    Barb Merino, MD [facility] Pager [phone]

## 2022-11-08 NOTE — Progress Notes (Signed)
Mobility Specialist - Progress Note   11/08/22 0933  Mobility  Activity Ambulated with assistance in hallway  Level of Assistance Standby assist, set-up cues, supervision of patient - no hands on  Assistive Device Front wheel walker  Distance Ambulated (ft) 250 ft  Activity Response Tolerated well  Mobility Referral Yes  $Mobility charge 1 Mobility   Pt received in bed and agreeable to mobility. No complaints during mobility. Pt to bed after session with all needs met.    [facility]

## 2022-11-08 NOTE — Consult Note (Signed)
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  Since being hospitalized patient has been manage for sepsis of unknown origin though could possibly be from Port-A-Cath versus chronically infected right hip prostheses, etc.  Patient seen by oncologist, Dr. Simeon Craft such, during hospitalization and was informed that palliative chemotherapy would hurt patient more than help her at this time due to complication she has had with it and so chemotherapy will no longer be offered.  Palliative medicine team consulted to assist with complex medical decision making.  Extensive review of EMR prior to seeing patient.  Patient already receiving gabapentin 300 4 times daily as well as oxycodone 5 mg every 4 as needed for pain management.  Saw patient had previously been seen by [facility] her care palliative home provider on 10/08/2022.  Discussed care with bedside RN and hospitalist prior to seeing patient today.  Presented to bedside and introduced myself as a member of the palliative medicine team and my role in patient's care.  Patient's husband, Jori Moll, was at bedside during entirety of conversation.  After introductions, patient able to update me regarding her medical care and discussions with oncologist.  She noted  that oncologist had mentioned going home with "palliative care support".  Spent time getting to know what brought patient joy at home and she was as active as she can be though she easily becomes fatigued.  Patient enjoys things like gardening and taking care of her home.  Discussed patient's hopes for time moving forward.  Patient is hoping for quality time where she can do as much as she can for as long as she can for as long as she has knowing that she will no longer receive cancer directed therapies.  Patient would want to focus on time at home and not do the back and forth to the hospital.  Knowing this about the patient, we discussed the similarities and differences between palliative medicine and hospice care.  Based on patient's current goals for medical care such as focusing on quality time and symptom management moving forward and staying at home without going back and forth to the hospital or further doctors appointments, would recommend hospice involvement at this time, not just palliative medicine involvement.  Extensive time discussing the differences of what each service would and would not provide.  Discussed that hospice would be a care team that would come to take care of patient at her home while providing DME and medications to focus on quality time for however long that is.  Did discuss the care guidelines dictate that hospice prognosis would likely be 6 months or less though patient can graduate from hospice or be on hospice for longer time as long as they are still meeting hospice eligibility requirements and focusing on symptom management at home at the end of their life.  Discussed myths associated with hospice and that patient  would not have to take any medication she does not feel she does not need.  Also discussed that hospice can be provided longer than just at directly the end of life.  Noted usually hospice is involved too late with a patient's care because they are in the hospital  and so sick though it has been studied that patients have lived longer and had better quality time with hospice and focusing on care at home's and spending time with loved ones.  Patient and husband appreciated thorough explanation on similarities and differences.  This patient had been seen by home [facility] care.  [facility] her care also has a hospice section.  If patient wants to continue with home palliative care they could always transition through [facility] her care to get hospice at home.  Noted there are lots of options and lots of hospice groups depending on what would fit her quality of life best.  They are going to consider their options moving forward to further determine how they want to proceed.  With permission, then able to discuss patient's CODE STATUS.  Patient stated that no one had addressed what "CODE STATUS" meant to her.  Discussed that patient is currently full code and what that would entail including CPR and intubation.  With permission, expressed concern that with patient's cancer which will Gress, if patient is sick enough that her heart stops or she stops breathing, interventions such as chest compressions and placing her on a ventilator are not going to reverse the underlying cancer process, would likely cause pain, and would not help support her getting back to the quality of life she is so values.  In this circumstance, discussed recommendation for DNR/DNI instead of full code.  Patient has been wish to consider this information and plan to discuss with husband's daughter who is an Therapist, sports.  Encouraged these conversations.  Spent time providing emotional support.  Patient and husband appropriately tearful during conversation.  Answered all questions as able at that time.  Noted should they want further information about hospice, they can always speak to hospice liaison to further clarify what support could be provided for the patient at home.  Thank them for allowing me  to visit today and noted palliative medicine will continue to follow along.  Primary Diagnoses  Present on Admission:  Sepsis ([place])  Essential hypertension  CKD stage 3 due to type 2 diabetes mellitus (HCC)  Ovarian cancer (HCC)  Chemotherapy-induced peripheral neuropathy (HCC)  Pancytopenia, acquired ([place])  Other ascites   Palliative Review of Systems: Denies any symptoms of concern at this time.  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[place]) 02/29/2016   skin- 2016- squamous- on right upper arm   Chronic kidney disease    Closed displaced fracture of right femoral neck (HCC) 11/07/2021   Deficiency anemia 11/02/2020   Diabetes mellitus without complication (HCC)    GERD (gastroesophageal reflux disease)    Gout    takes Allopurinol daily   History of chemotherapy    History of colon polyps    benign   History of shingles    Hx of radiation therapy    Hyperlipidemia    Hypertension    Ovarian ca ([place]) dx'd 03/2020   Personal history of chemotherapy    2017   Personal history of radiation  therapy    2017   Vitamin D deficiency    Social History   Socioeconomic History   Marital status: Married    Spouse name: [name]   Number of children: 0   Years of education: Not on file   Highest education level: Not on file  Occupational History   Not on file  Tobacco Use   Smoking status: Never   Smokeless tobacco: Never  Vaping Use   Vaping Use: Never used  Substance and Sexual Activity   Alcohol use: No   Drug use: No   Sexual activity: Not Currently    Birth control/protection: Surgical, Post-menopausal  Other Topics Concern   Not on file  Social History Narrative   Not on file   Social Determinants of Health   Financial Resource Strain: Not on file  Food Insecurity: No Food Insecurity (08/24/2022)   Hunger Vital Sign     Worried About Running Out of Food in the Last Year: Never true    Ran Out of Food in the Last Year: Never true  Transportation Needs: No Transportation Needs (08/24/2022)   PRAPARE - Hydrologist (Medical): No    Lack of Transportation (Non-Medical): No  Physical Activity: Not on file  Stress: Not on file  Social Connections: Not on file   Family History  Problem Relation Age of Onset   Stroke Mother 91   Other Mother        history of hysterectomy after last childbirth   Diabetes Father    Alzheimer's disease Father    Hypertension Sister    Lung cancer Sister 72       smoker   Other Sister        history of hysterectomy for fibroids   Breast cancer Sister 92   Cirrhosis Sister    Stroke Maternal Grandmother    Heart Problems Maternal Grandmother    Esophageal cancer Maternal Aunt 74       not a smoker   Parkinson's disease Maternal Aunt    Heart attack Maternal Uncle 65   Pancreatic cancer Cousin 101       paternal 1st cousin   Lung cancer Other        nephew dx. 73s; +smoker   Epilepsy Other    Other Other 5       great niece dx. benign ganglioglioma brain tumor; treated at [facility]   Epilepsy Other        no seizures in 2 years   Colon cancer Neg Hx    Stomach cancer Neg Hx    Colon polyps Neg Hx    Ulcerative colitis Neg Hx    Scheduled Meds:  allopurinol  100 mg Oral Daily   ascorbic acid  500 mg Oral QPM   aspirin EC  81 mg Oral BID   cholecalciferol  5,000 Units Oral Daily   cyanocobalamin  1,000 mcg Oral Daily   gabapentin  300 mg Oral QID   insulin aspart  0-5 Units Subcutaneous QHS   insulin aspart  0-6 Units Subcutaneous TID WC   metoprolol succinate  25 mg Oral Daily   pantoprazole  40 mg Oral Daily   potassium chloride  40 mEq Oral BID   Ensure Max Protein  11 oz Oral BID   Tbo-filgastrim (GRANIX) SQ  300 mcg Subcutaneous Daily   Continuous Infusions:  sodium chloride Stopped (11/07/22 1713)   PRN  Meds:.acetaminophen **OR** acetaminophen, ondansetron **OR** ondansetron (ZOFRAN) IV,  oxyCODONE Allergies  Allergen Reactions   Mobic [Meloxicam] Swelling   Prednisone Swelling and Other (See Comments)    Can Not take High doses   Premarin [Estrogens Conjugated] Hives   Trovan [Alatrofloxacin] Other (See Comments)    "I don't tolerate this well"   CBC:    Component Value Date/Time   WBC 21.0 (H) 11/08/2022 0910   HGB 8.7 (L) 11/08/2022 0910   HGB 10.1 (L) 10/28/2022 1100   HGB 12.9 11/06/2016 1102   HCT 27.9 (L) 11/08/2022 0910   HCT 31.7 (L) 11/19/2020 0752   HCT 38.0 11/06/2016 1102   PLT 72 (L) 11/08/2022 0910   PLT 92 (L) 10/28/2022 1100   PLT 89 (L) 11/06/2016 1102   MCV 99.6 11/08/2022 0910   MCV 90.0 11/06/2016 1102   NEUTROABS 14.8 (H) 11/08/2022 0910   NEUTROABS 2.0 11/06/2016 1102   LYMPHSABS 2.0 11/08/2022 0910   LYMPHSABS 1.6 11/06/2016 1102   MONOABS 1.7 (H) 11/08/2022 0910   MONOABS 0.5 11/06/2016 1102   EOSABS 0.1 11/08/2022 0910   EOSABS 0.2 11/06/2016 1102   BASOSABS 0.0 11/08/2022 0910   BASOSABS 0.0 11/06/2016 1102   Comprehensive Metabolic Panel:    Component Value Date/Time   NA 139 11/06/2022 1344   NA 141 11/06/2016 1102   K 3.2 (L) 11/06/2022 1344   K 3.9 11/06/2016 1102   CL 108 11/06/2022 1344   CO2 25 11/06/2022 1344   CO2 27 11/06/2016 1102   BUN 32 (H) 11/06/2022 1344   BUN 18.0 11/06/2016 1102   CREATININE 1.13 (H) 11/08/2022 0910   CREATININE 1.02 (H) 10/28/2022 1100   CREATININE 1.09 (H) 09/15/2022 0000   CREATININE 1.3 (H) 11/06/2016 1102   GLUCOSE 136 (H) 11/06/2022 1344   GLUCOSE 130 11/06/2016 1102   CALCIUM 8.7 (L) 11/06/2022 1344   CALCIUM 10.2 11/06/2016 1102   AST 34 11/06/2022 1344   AST 42 (H) 10/28/2022 1100   AST 52 (H) 11/06/2016 1102   ALT 24 11/06/2022 1344   ALT 14 10/28/2022 1100   ALT 58 (H) 11/06/2016 1102   ALKPHOS 96 11/06/2022 1344   ALKPHOS 159 (H) 11/06/2016 1102   BILITOT 1.2 11/06/2022 1344    BILITOT 0.8 10/28/2022 1100   BILITOT 0.53 11/06/2016 1102   PROT 5.9 (L) 11/06/2022 1344   PROT 7.1 11/06/2016 1102   ALBUMIN 2.6 (L) 11/06/2022 1344   ALBUMIN 3.7 11/06/2016 1102    Physical Exam: Vital Signs: BP (!) 114/55 (BP Location: Right Arm)   Pulse 95   Temp 98.1 F (36.7 C) (Oral)   Resp 16   Ht 5' 4"  (1.626 m)   Wt 66.7 kg   SpO2 91%   BMI 25.23 kg/m  SpO2: SpO2: 91 % O2 Device: O2 Device: Room Air O2 Flow Rate:   Intake/output summary:  Intake/Output Summary (Last 24 hours) at 11/08/2022 1022 Last data filed at 11/08/2022 0300 Gross per 24 hour  Intake 2296.2 ml  Output --  Net 2296.2 ml   LBM: Last BM Date : 11/06/22 Baseline Weight: Weight: 66.7 kg Most recent weight: Weight: 66.7 kg  General: NAD, alert, interactive, frail, chronically ill-appearing Eyes: No discharge noted HENT: moist mucous membranes Cardiovascular: RRR, bilateral pedal edema Respiratory: no increased work of breathing noted, not in respiratory distress Abdomen: Mildly distended Extremities: Able to move all extremities with purpose Skin: no rashes or lesions on visible skin Neuro: A&Ox4, following commands easily Psych: appropriately answers all questions  Palliative Performance Scale: 50%              Additional Data Reviewed: Recent Labs    11/06/22 1344 11/07/22 0409 11/08/22 0910  WBC 0.7* 1.8* 21.0*  HGB 9.6* 8.9* 8.7*  PLT 54* 49* 72*  NA 139  --   --   BUN 32*  --   --   CREATININE 1.01*  --  1.13*    Imaging: CT Head Wo Contrast CLINICAL DATA:  Provided history: Headache, sudden, severe.  EXAM: CT HEAD WITHOUT CONTRAST  TECHNIQUE: Contiguous axial images were obtained from the base of the skull through the vertex without intravenous contrast.  RADIATION DOSE REDUCTION: This exam was performed according to the departmental dose-optimization program which includes automated exposure control, adjustment of the mA and/or kV according to patient  size and/or use of iterative reconstruction technique.  COMPARISON:  Head CT 08/22/2022. Brain MRI 09/24/2006.  FINDINGS: Brain:  Mild-to-moderate generalized cerebral atrophy.  Mild patchy and ill-defined hypoattenuation within the cerebral white matter, nonspecific but compatible with chronic small vessel ischemic disease.  There is no acute intracranial hemorrhage.  No demarcated cortical infarct.  No extra-axial fluid collection.  No evidence of an intracranial mass.  No midline shift.  Vascular: No hyperdense vessel.  Atherosclerotic calcifications.  Skull: No fracture or aggressive osseous lesion.  Sinuses/Orbits: No mass or acute finding within the imaged orbits. No significant paranasal sinus disease at the imaged levels  Other: Partially imaged chronic bilateral frontal process of maxilla fractures.  IMPRESSION: No evidence of acute intracranial abnormality.  Mild chronic small vessel ischemic changes within the cerebral white matter.  Mild-to-moderate generalized cerebral atrophy.  Electronically Signed   By: Kellie Simmering D.O.   On: 11/06/2022 16:21 CT ABDOMEN PELVIS W CONTRAST CLINICAL DATA:  Acute abdominal pain and distension. Black stool. History of ovarian cancer.  EXAM: CT ABDOMEN AND PELVIS WITH CONTRAST  TECHNIQUE: Multidetector CT imaging of the abdomen and pelvis was performed using the standard protocol following bolus administration of intravenous contrast.  RADIATION DOSE REDUCTION: This exam was performed according to the departmental dose-optimization program which includes automated exposure control, adjustment of the mA and/or kV according to patient size and/or use of iterative reconstruction technique.  CONTRAST:  153m OMNIPAQUE IOHEXOL 300 MG/ML  SOLN  COMPARISON:  CT abdomen and pelvis 10/28/2022  FINDINGS: Lower chest: No acute abnormality. Rounded low-attenuation area in the right lung base along the hemidiaphragm  has slightly increased in size measuring up to 2 cm (previously 1.4 cm. This may represent small diaphragmatic hernia containing ascites or low-density nodule.  Hepatobiliary: No focal liver abnormality is seen. No gallstones, gallbladder wall thickening, or biliary dilatation.  Pancreas: Unremarkable. No pancreatic ductal dilatation or surrounding inflammatory changes.  Spleen: Normal in size without focal abnormality.  Adrenals/Urinary Tract: Limited evaluation of the bladder secondary to streak artifact in the pelvis. The kidneys appears stable. There is a cyst in the left kidney measuring 13 mm. No hydronephrosis or perinephric fat stranding. The adrenal glands are within normal limits.  Stomach/Bowel: No evidence for bowel obstruction, pneumatosis or free air. There is wall thickening and mild inflammatory stranding of the sigmoid colon to the level the rectum. Stomach is decompressed and not well evaluated. There is also some mild wall thickening of jejunal loops in the left abdomen. The appendix is not visualized.  Vascular/Lymphatic: Aortic atherosclerosis. No enlarged abdominal or pelvic lymph nodes.  Reproductive: Status post hysterectomy. No adnexal masses.  Other: There is  a moderate-to-large amount of ascites throughout the abdomen and pelvis which has increased from prior. Peritoneal nodularity in the right abdomen measuring up to 11 mm and in the anterior left abdomen measuring up to 5 mm appears unchanged. Small scattered nodules or lymph nodes in the pelvis adjacent to the sigmoid colon have not significantly changed.  Musculoskeletal: Right hip arthroplasty is present. No acute fracture or focal osseous lesion.  IMPRESSION: 1. Wall thickening and inflammatory stranding of the sigmoid colon and rectum compatible with colitis/proctitis. 2. Mild wall thickening of jejunal loops in the left abdomen compatible with enteritis. 3. Moderate-to-large amount of  ascites throughout the abdomen and pelvis has increased from prior. 4. Stable peritoneal nodularity compatible with metastatic disease. 5. Rounded low-attenuation area in the right lung base along the hemidiaphragm has slightly increased in size. This may represent small diaphragmatic hernia containing ascites or low-density nodule.  Aortic Atherosclerosis (ICD10-I70.0).  Electronically Signed   By: Ronney Asters M.D.   On: 11/06/2022 16:18    I personally reviewed recent imaging.   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  Since being hospitalized patient has been manage for sepsis of unknown origin though could possibly be from Port-A-Cath versus chronically infected right hip prostheses, etc.  Patient seen by oncologist, Dr. Simeon Craft such, during hospitalization and was informed that palliative chemotherapy would hurt patient more than help her at this time due to complication she has had with it and so chemotherapy will no longer be offered.  Palliative medicine team consulted to assist with complex medical decision making.  # Complex medical decision making/goals of care  -Extensive conversation with patient while husband at bedside as described above in HPI.  Discussed similarities and differences between palliative care and hospice and what each would provide upon discharge.  Patient notes that her goal is to focus on feeling as good as she can for as long as she has to enjoy  time at home and with though she loves.  Based on patient's wishes for medical care moving forward, recommended that hospice would be the best way to support time at home, focusing on symptom management, not coming back and forth to the hospital, to enjoy time at home with loved ones.  Patient and husband to consider options of following up with palliative versus home hospice.   -Patient previously seen by [facility] her care palliative care.  Explained to them that this could continue upon discharge or if patient feels she is having higher level of needs, could discuss with [facility] or care evaluation for hospice.  -Patient has not completed ACP documentation.  Patient states that if she was unable to make medical decisions for herself she would want her husband, Eydie Wormley, to make medical decisions for her.  They do not have any children together though Jori Moll has 2 daughters from a prior relationship.  One of those daughters is a Marine scientist who works in [place] time discussing patient's CODE STATUS with her and the differences between full code and DNR/DNI.  In the setting of patient having  cancer which will progress, concerned that if she is sick enough her heart should stop or she should stop breathing, interventions such as chest compressions and intubation would not reverse her underlying medical process and would likely cause harm and not p provide her with quality time in her life moving forward.  Patient and family to consider discussion about CODE STATUS.  Patient to remain full code at this time.  -  Code Status: Full Code   # Symptom management  Denies any symptoms of concern at this time.  Already on gabapentin 300 mg 4 times daily and oxycodone 5 mg every 4 hours as needed.  # Psycho-social/Spiritual Support:  - Support System:   # Discharge Planning:  Home, support TBD  Thank you for allowing the palliative care team to participate in the care [place].  Chelsea Aus,  DO Palliative Care Provider PMT # [phone]  If patient remains symptomatic despite maximum doses, please call PMT at [phone] between 0700 and 1900. Outside of these hours, please call attending, as PMT does not have night coverage.  This provider spent a total of 87 minutes providing patient's care.  Includes review of EMR, discussing care with other staff members involved in patient's medical care, obtaining relevant history and information from patient and/or patient's family, and personal review of imaging and lab work. Greater than 50% of the time was spent counseling and coordinating care related to the above assessment and plan.

## 2022-11-09 DIAGNOSIS — Z515 Encounter for palliative care: Secondary | ICD-10-CM | POA: Diagnosis not present

## 2022-11-09 DIAGNOSIS — K529 Noninfective gastroenteritis and colitis, unspecified: Secondary | ICD-10-CM | POA: Diagnosis not present

## 2022-11-09 DIAGNOSIS — Z7189 Other specified counseling: Secondary | ICD-10-CM

## 2022-11-09 DIAGNOSIS — G620 Drug-induced polyneuropathy: Secondary | ICD-10-CM | POA: Diagnosis not present

## 2022-11-09 DIAGNOSIS — C569 Malignant neoplasm of unspecified ovary: Secondary | ICD-10-CM | POA: Diagnosis not present

## 2022-11-09 DIAGNOSIS — D61818 Other pancytopenia: Secondary | ICD-10-CM | POA: Diagnosis not present

## 2022-11-09 DIAGNOSIS — A419 Sepsis, unspecified organism: Secondary | ICD-10-CM | POA: Diagnosis not present

## 2022-11-09 LAB — CBC WITH DIFFERENTIAL/PLATELET
Abs Immature Granulocytes: 1.88 10*3/uL — ABNORMAL HIGH (ref 0.00–0.07)
Basophils Absolute: 0 10*3/uL (ref 0.0–0.1)
Basophils Relative: 0 %
Eosinophils Absolute: 0 10*3/uL (ref 0.0–0.5)
Eosinophils Relative: 0 %
HCT: 24.9 % — ABNORMAL LOW (ref 36.0–46.0)
Hemoglobin: 8.1 g/dL — ABNORMAL LOW (ref 12.0–15.0)
Immature Granulocytes: 8 %
Lymphocytes Relative: 9 %
Lymphs Abs: 1.9 10*3/uL (ref 0.7–4.0)
MCH: 31.8 pg (ref 26.0–34.0)
MCHC: 32.5 g/dL (ref 30.0–36.0)
MCV: 97.6 fL (ref 80.0–100.0)
Monocytes Absolute: 2.2 10*3/uL — ABNORMAL HIGH (ref 0.1–1.0)
Monocytes Relative: 10 %
Neutro Abs: 16.2 10*3/uL — ABNORMAL HIGH (ref 1.7–7.7)
Neutrophils Relative %: 73 %
Platelets: 66 10*3/uL — ABNORMAL LOW (ref 150–400)
RBC: 2.55 MIL/uL — ABNORMAL LOW (ref 3.87–5.11)
RDW: 17.1 % — ABNORMAL HIGH (ref 11.5–15.5)
WBC: 22.3 10*3/uL — ABNORMAL HIGH (ref 4.0–10.5)
nRBC: 0.6 % — ABNORMAL HIGH (ref 0.0–0.2)

## 2022-11-09 LAB — GLUCOSE, CAPILLARY
Glucose-Capillary: 114 mg/dL — ABNORMAL HIGH (ref 70–99)
Glucose-Capillary: 139 mg/dL — ABNORMAL HIGH (ref 70–99)
Glucose-Capillary: 179 mg/dL — ABNORMAL HIGH (ref 70–99)
Glucose-Capillary: 128 mg/dL — ABNORMAL HIGH (ref 70–99)

## 2022-11-09 LAB — COMPREHENSIVE METABOLIC PANEL
ALT: 19 U/L (ref 0–44)
AST: 33 U/L (ref 15–41)
Albumin: 1.9 g/dL — ABNORMAL LOW (ref 3.5–5.0)
Alkaline Phosphatase: 127 U/L — ABNORMAL HIGH (ref 38–126)
Anion gap: 5 (ref 5–15)
BUN: 23 mg/dL (ref 8–23)
CO2: 21 mmol/L — ABNORMAL LOW (ref 22–32)
Calcium: 8.6 mg/dL — ABNORMAL LOW (ref 8.9–10.3)
Chloride: 111 mmol/L (ref 98–111)
Creatinine, Ser: 1.24 mg/dL — ABNORMAL HIGH (ref 0.44–1.00)
GFR, Estimated: 43 mL/min — ABNORMAL LOW (ref >60)
Glucose, Bld: 108 mg/dL — ABNORMAL HIGH (ref 70–99)
Potassium: 5 mmol/L (ref 3.5–5.1)
Sodium: 137 mmol/L (ref 135–145)
Total Bilirubin: 0.7 mg/dL (ref 0.3–1.2)
Total Protein: 4.5 g/dL — ABNORMAL LOW (ref 6.5–8.1)

## 2022-11-09 LAB — MAGNESIUM: Magnesium: 1.4 mg/dL — ABNORMAL LOW (ref 1.7–2.4)

## 2022-11-09 LAB — PHOSPHORUS: Phosphorus: 3.5 mg/dL (ref 2.5–4.6)

## 2022-11-09 MED ORDER — MAGNESIUM SULFATE 2 GM/50ML IV SOLN
2.0000 g | Freq: Once | INTRAVENOUS | Status: AC
  Administered 2022-11-09: 2 g via INTRAVENOUS
  Filled 2022-11-09: qty 50

## 2022-11-09 NOTE — Progress Notes (Signed)
PROGRESS NOTE    Kayla Baker  RAQ:[id number] DOB: 18-May-1936 DOA: 11/06/2022 PCP: [name], MD    Brief Narrative:  86 y.o. 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  Husband at the bedside.  Patient is also a good historian.  Patient tells me that for the last 2 days she feels like she is repeating tasks, her husband noticed that she has been confused  In the Emergency room,  Afebrile.  Blood pressure stable.  Severely neutropenic with WBC count of 700 with ANC of 0.1.  Lactic acid mildly elevated.  Patient otherwise hemodynamically stable. CT head was without acute findings. CT abdomen pelvis with moderate ascites, inflammatory stranding on the left side of the colon likely reactive due to ascites.  Patient without diarrhea. Blood cultures were drawn and patient started on broad-spectrum antibiotics given severe neutropenia and suspected sepsis of unknown source.  Patient does have a port on her right chest.  Assessment & Plan:   Sepsis, presumed due to presence of severe neutropenia, altered mental status.  Multiple possible source of infection. 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 Left-sided colitis seen on CT scan. Now developing diarrhea after hospitalization.  Blood cultures, negative so far. Urine culture, 20,000 colonies of Klebsiella. Treated with broad-spectrum IV antibiotics with vancomycin and cefepime, neutrophils adequately stabilized.  No  identifiable source of infection.  Discontinue all IV antibiotics. Patient will go back on cefadroxil 1 g twice daily that she takes as chronic suppressive therapy.  Will treat with 5 additional days of Flagyl for suspected colitis, however this may be secondary to edema of the walls from ascites. Diagnostic paracentesis was discussed and delayed due to risk of introducing infection and bleeding from low blood counts.  Platelet counts are appropriately improving.  Metabolic encephalopathy, infective metabolic encephalopathy: Nonfocal exam.  Mental status stable and normalized. B12 normal, on replacement. Folic acid, low.  Started on oral replacement. Hypomagnesemia, replace aggressively.  Recheck tomorrow. Hypophosphatemia, additional phosphate given.  Adequate.   Ammonia was normal. CT head was normal.   Ovarian cancer on Taxol, severe pancytopenia: Oncology following.  Patient had received Neulasta after her chemotherapy last week.  Severe chemotherapy induced complications, not candidate for further chemo as per oncology.  Started on Granix.  WBC count overcorrected.  Discontinue Granix.  Recheck tomorrow morning.  Essential hypertension: Blood pressure stable.  She is at risk of hypotension.  Continue beta-blockers but hold other antihypertensives.   Type 2 diabetes, well-controlled: She is on glipizide at home.  Keep on very low-dose sliding scale insulin.    Interdisciplinary Goals of Care Family Meeting   Date carried out: 11/09/2022  Location of the meeting: Bedside  Member's involved: Physician, Family Member or next of kin, and Other: Husband, stepdaughter who is also an Environmental health practitioner: Husband  Discussion: We discussed goals of care for [employer] .  Reinforced oncology recommendation of not continuing further chemotherapy and family agrees.  Also discussed about home-based palliative or hospice care.  Also discussed CODE  STATUS. -Patient and family do not  believe that she is at end-of-life so they do not agree with hospice for end-of-life care. -They do continue to visit hospitals if needed for noncancer related issues including infections or other care. -Patient believes that if she does not do chemotherapy, she still has long life..  I expressed my concern that only time can tell us about this. -They do not agree to go home with home hospice as previously discussed, they are agreeable to have home palliative care therapy. -Discussed DNR/DNI, introduced MOST form and encouraged to look at it.  Also explained to them that being DNR does not prevent her from getting any treatment or surgery.  Code status: Full Code  Disposition: Continue current acute care  Time spent for the meeting: 45 minutes   As previously discussed with oncology, patient will be monitored in the hospital today.  Oncology to reconvene with family tomorrow.  Possibly she can go home tomorrow with oral antibiotics and palliative care at home.   DVT prophylaxis: SCDs Start: 11/06/22 2108   Code Status: full code  Family Communication: husband and daughter at the bedside. Disposition Plan: Status is: Inpatient Remains inpatient appropriate because: Antibiotics.  Monitoring.     Consultants:  Oncology Palliative care  Procedures:  None  Antimicrobials:  Vancomycin and cefepime 12/7--- 12/9 Cefadroxil, long-term treatment Flagyl, 12/9---   Subjective:  Patient seen and examined.  Denies any overnight events.  Denies any significant nausea vomiting or abdominal pain.  No more confusion episodes after coming to the hospital.  Remains afebrile. She had multiple small volume loose stool all day yesterday but none since last night.  Used 1 dose of Imodium last night.  Objective: Vitals:   11/08/22 0525 11/08/22 1417 11/08/22 2203 11/09/22 0450  BP: (!) 114/55 (!) 110/52 (!) 120/53 (!) 114/58  Pulse: 95 88 93 96  Resp: 16 16  16 18   Temp: 98.1 F (36.7 C) 98.5 F (36.9 C) 98.5 F (36.9 C) 98 F (36.7 C)  TempSrc: Oral Oral Oral Oral  SpO2: 91% 90% 96% 97%  Weight:      Height:        Intake/Output Summary (Last 24 hours) at 11/09/2022 1225 Last data filed at 11/08/2022 1759 Gross per 24 hour  Intake 1301.4 ml  Output --  Net 1301.4 ml   Filed Weights   11/06/22 1821  Weight: 66.7 kg    Examination:  General exam: Appears calm and comfortable.  Frail and debilitated.  Pale.  Chronically sick looking but not in any distress.  Alert oriented x 4.  Pleasant to conversation. Respiratory system: No added sounds. Cardiovascular system: S1 & S2 heard, RRR.  Port-A-Cath on the right chest wall. Gastrointestinal system: Abdomen is mildly distended but soft and nontender.   Central nervous system: Alert and oriented. No focal neurological deficits. Extremities: Symmetric 5 x 5 power.  Bilateral pedal edema.  Right more than left.    Data Reviewed: I have personally reviewed following labs and imaging studies  CBC: Recent Labs  Lab 11/06/22 1344 11/07/22 0409 11/08/22 0910 11/09/22 0517  WBC 0.7* 1.8* 21.0* 22.3*  NEUTROABS 0.1* 0.5* 14.8* 16.2*  HGB 9.6* 8.9* 8.7* 8.1*  HCT 30.1* 27.5* 27.9* 24.9*  MCV 98.4 98.2 99.6 97.6  PLT 54* 49* 72* 66*   Basic Metabolic Panel: Recent Labs  Lab 11/06/22 1344 11/06/22 1737 11/08/22 0910 11/09/22 0517  NA 139  --   --  137  K 3.2*  --   --  5.0  CL 108  --   --  111  CO2 25  --   --  21*  GLUCOSE 136*  --   --  108*  BUN 32*  --   --  23  CREATININE 1.01*  --  1.13* 1.24*  CALCIUM 8.7*  --   --  8.6*  MG  --  1.3*  --  1.4*  PHOS  --  1.7* 2.0* 3.5   GFR: Estimated Creatinine Clearance: 31.7 mL/min (A) (by C-G formula based on SCr of 1.24 mg/dL (H)). Liver Function Tests: Recent Labs  Lab 11/06/22 1344 11/09/22 0517  AST 34 33  ALT 24 19  ALKPHOS 96 127*  BILITOT 1.2 0.7  PROT 5.9* 4.5*  ALBUMIN 2.6* 1.9*   Recent Labs  Lab  11/06/22 1344  LIPASE 29   Recent Labs  Lab 11/06/22 1344  AMMONIA 29   Coagulation Profile: Recent Labs  Lab 11/06/22 1344  INR 1.3*   Cardiac Enzymes: No results for input(s): "CKTOTAL", "CKMB", "CKMBINDEX", "TROPONINI" in the last 168 hours. BNP (last 3 results) No results for input(s): "PROBNP" in the last 8760 hours. HbA1C: No results for input(s): "HGBA1C" in the last 72 hours. CBG: Recent Labs  Lab 11/08/22 1206 11/08/22 1626 11/08/22 2201 11/09/22 0736 11/09/22 1203  GLUCAP 147* 148* 165* 114* 139*   Lipid Profile: No results for input(s): "CHOL", "HDL", "LDLCALC", "TRIG", "CHOLHDL", "LDLDIRECT" in the last 72 hours. Thyroid Function Tests: Recent Labs    11/06/22 1737  TSH 1.423   Anemia Panel: Recent Labs    11/06/22 1737  VITAMINB12 3,138*  FOLATE 5.3*   Sepsis Labs: Recent Labs  Lab 11/06/22 1345 11/06/22 1737  LATICACIDVEN 2.3* 2.3*    Recent Results (from the past 240 hour(s))  Blood culture (routine x 2)     Status: None (Preliminary result)   Collection Time: 11/06/22  5:30 PM   Specimen: BLOOD  Result Value Ref Range Status   Specimen Description   Final    BLOOD BLOOD RIGHT HAND Performed at [facility] [address] [place], [place] [address]    Special Requests   Final    BOTTLES DRAWN AEROBIC AND ANAEROBIC Blood Culture adequate volume Performed at [facility] [address], [place], [place] [address]    Culture   Final    NO GROWTH 3 DAYS Performed at [facility], [place] [address] [place], [place] [address]    Report Status PENDING  Incomplete  Blood culture (routine x 2)     Status: None (Preliminary result)   Collection Time: 11/06/22  5:30 PM   Specimen: BLOOD  Result Value Ref Range Status   Specimen Description   Final    BLOOD RIGHT ANTECUBITAL Performed at [facility] [address] [place], [place] [address]    Special Requests   Final     BOTTLES DRAWN AEROBIC AND ANAEROBIC Blood Culture results may not be optimal due to an excessive volume of blood received in culture bottles Performed at [facility] [address] [place], [place] [address]    Culture   Final    NO GROWTH 3 DAYS Performed at [facility], [place] [address] [place], [place] [address]    Report Status PENDING  Incomplete  Urine Culture     Status: Abnormal   Collection Time: 11/06/22  5:33 PM   Specimen: Urine, Clean Catch  Result Value Ref Range  Status   Specimen Description   Final    URINE, CLEAN CATCH Performed at [facility], [place] [address], [place], [place] [address]    Special Requests   Final    NONE Performed at [facility], [place] [address] [place], [place] [address]    Culture (A)  Final    20,000 COLONIES/mL KLEBSIELLA OXYTOCA Confirmed Extended Spectrum Beta-Lactamase Producer (ESBL).  In bloodstream infections from ESBL organisms, carbapenems are preferred over piperacillin/tazobactam. They are shown to have a lower risk of mortality.    Report Status 11/08/2022 FINAL  Final   Organism ID, Bacteria KLEBSIELLA OXYTOCA (A)  Final      Susceptibility   Klebsiella oxytoca - MIC*    AMPICILLIN >=32 RESISTANT Resistant     CEFAZOLIN >=64 RESISTANT Resistant     CEFEPIME 1 SENSITIVE Sensitive     CEFTRIAXONE 8 RESISTANT Resistant     CIPROFLOXACIN <=0.25 SENSITIVE Sensitive     GENTAMICIN <=1 SENSITIVE Sensitive     IMIPENEM 0.5 SENSITIVE Sensitive     NITROFURANTOIN 32 SENSITIVE Sensitive     TRIMETH/SULFA <=20 SENSITIVE Sensitive     AMPICILLIN/SULBACTAM >=32 RESISTANT Resistant     PIP/TAZO >=128 RESISTANT Resistant     * 20,000 COLONIES/mL KLEBSIELLA OXYTOCA         Radiology Studies: No results found.      Scheduled Meds:  allopurinol  100 mg Oral Daily   ascorbic acid  500 mg Oral QPM   aspirin EC  81 mg Oral BID   cefadroxil  1,000 mg Oral BID    Chlorhexidine Gluconate Cloth  6 each Topical Daily   cholecalciferol  5,000 Units Oral Daily   cyanocobalamin  1,000 mcg Oral Daily   folic acid  1 mg Oral Daily   gabapentin  300 mg Oral QID   hydrocortisone cream   Topical TID   insulin aspart  0-5 Units Subcutaneous QHS   insulin aspart  0-6 Units Subcutaneous TID WC   metoprolol succinate  25 mg Oral Daily   metroNIDAZOLE  500 mg Oral Q12H   pantoprazole  40 mg Oral Daily   Ensure Max Protein  11 oz Oral BID   Continuous Infusions:      LOS: 3 days    Time spent: 35 minutes    Barb Merino, MD [facility] Pager [phone]

## 2022-11-09 NOTE — Progress Notes (Signed)
Mobility Specialist - Progress Note   11/09/22 1356  Mobility  Activity Ambulated with assistance in hallway  Level of Assistance Standby assist, set-up cues, supervision of patient - no hands on  Assistive Device Front wheel walker  Distance Ambulated (ft) 460 ft  Activity Response Tolerated well  Mobility Referral Yes  $Mobility charge 1 Mobility   Pt received in bed and agreeable to mobility. No complaints during session. Pt stated she actually feels better today.  Pt to bed after session with all needs met & family in room.   [facility]

## 2022-11-09 NOTE — Progress Notes (Signed)
Daily Progress Note   Patient Name: Kayla Baker       Date: 11/09/2022 DOB: 1936/01/12  Age: 86 y.o. MRN#: [id number] Attending Physician: Barb Merino, MD Primary Care Physician: [name], MD Admit Date: 11/06/2022 Length of Stay: 3 days  Reason for Consultation/Follow-up: Establishing goals of care  Subjective:   CC: Patient notes her symptoms have improved today.  Follow-up regarding complex medical decision making.  Subjective:  Had spent extensive time with patient and husband yesterday explaining the similarities and differences between palliative care and hospice care because they support different things based on once goals for medical care.  Extensive review of EMR prior to seeing patient.  Patient has been already discussed with hospitalist this morning that they do not believe she is at the end of life and so they do not want to elect hospice care at this time.  They have now elected to continue to visit hospitals if needed for further noncancer related issues including infections or other care.  Patient wanting to remain full code at this time.  Discussed care with bedside RN for updates.  Presented to bedside in afternoon to check on patient.  Patient laying comfortably in bed with her husband and another family at bedside.  Reintroduced myself as a member of the palliative medicine team and patient and has been remembered speaking with me yesterday at length.  Patient confirms she has already discussed her decisions regarding medical decision making moving forward with the hospitalist today.  Will continue with hospital visits for noncancer related therapies.  Does not believe she is at the end of life yet and so does not want hospice support.  Will continue with home palliative support through [facility] as needed.  Patient does not want to change her CODE STATUS at this time.  Inquired if any questions further for the palliative medicine provider and then died any concerns at  this time.  Noted palliative medicine team would be available if needed in the future.  Review of Systems Notes symptoms improving at this time. Objective:   Vital Signs:  BP (!) 114/58 (BP Location: Right Arm)   Pulse 96   Temp 98 F (36.7 C) (Oral)   Resp 18   Ht 5' 4"  (1.626 m)   Wt 66.7 kg   SpO2 97%   BMI 25.23 kg/m   Physical Exam: General: NAD, alert, interactive, frail, chronically ill-appearing Eyes: No discharge noted HENT: moist mucous membranes Cardiovascular: RRR, bilateral pedal edema Respiratory: no increased work of breathing noted, not in respiratory distress Abdomen: Mildly distended Extremities: Able to move all extremities with purpose Skin: no rashes or lesions on visible skin Neuro: A&Ox4, following commands easily Psych: appropriately answers all questions  Imaging:  I personally reviewed recent imaging.   Assessment & Plan:   Assessment: Patient is an 86 year old female with a past medical history of breast cancer status postmastectomy and chemotherapy, malignant ovarian cancer status post radical hysterectomy and recently receiving chemotherapy, right hip prosthetic infection with MSSA currently on chronic suppressive therapy, hypertension, hyperlipidemia, GERD, and type 2 diabetes who was admitted on 11/06/2022 for management of weakness with confusion and abdominal distention at home. Since being hospitalized patient has been manage for sepsis of unknown origin though could possibly be from Port-A-Cath versus chronically infected right hip prostheses, etc. Patient seen by oncologist, Dr. Simeon Craft such, during hospitalization and was informed that palliative chemotherapy would hurt patient more than help her at this time due to complication she has  had with it and so chemotherapy will no longer be offered. Palliative medicine team consulted to assist with complex medical decision making.   Recommendations/Plan: # Complex medical decision making/goals of  care                -Had extensive conversation with patient and husband on 11/08/2022 discussing the similarities and differences between palliative care and hospice as the support they can provide is different based on ones goals for medical care moving forward.  Patient has elected to return to the hospital for noncancer related interventions.  Patient would like to continue with full scope of therapies and to remain full code at this time.  Patient will have involvement with home [facility] palliative care group if needed.    -Patient had stated that should she be unable to make medical decisions for herself, she would want her husband Kayla Baker, to make medical decisions for her.                -  Code Status: Full Code    # Symptom management                Denies any symptoms of concern at this time.  Already on gabapentin 300 mg 4 times daily and oxycodone 5 mg every 4 hours as needed.  # Psychosocial Support:  -Husband, stepdaughter is an Therapist, sports in [employer]  # Discharge Planning: Home as per hospitalist, can have [facility] palliative medicine home visits  Discussed with: patient, family, bedside RN, hospitalist  Thank you for allowing the palliative care team to participate in the care [facility].  Chelsea Aus, DO Palliative Care Provider PMT # [phone]  As goals for medical care are currently determined and patient wants to continue with full scope of therapies and to remain full code, palliative medicine team will sign off at this time.  Please reach out if our team can be of assistance in the future.  This provider spent a total of 36 minutes providing patient's care.  Includes review of EMR, discussing care with other staff members involved in patient's medical care, obtaining relevant history and information from patient and/or patient's family, and personal review of imaging and lab work. Greater than 50% of the time was spent counseling and coordinating care related to the above  assessment and plan.

## 2022-11-10 ENCOUNTER — Telehealth: Payer: Self-pay

## 2022-11-10 ENCOUNTER — Other Ambulatory Visit: Payer: Self-pay

## 2022-11-10 DIAGNOSIS — C569 Malignant neoplasm of unspecified ovary: Secondary | ICD-10-CM

## 2022-11-10 DIAGNOSIS — D709 Neutropenia, unspecified: Secondary | ICD-10-CM | POA: Diagnosis not present

## 2022-11-10 LAB — COMPREHENSIVE METABOLIC PANEL
ALT: 16 U/L (ref 0–44)
AST: 28 U/L (ref 15–41)
Albumin: 1.8 g/dL — ABNORMAL LOW (ref 3.5–5.0)
Alkaline Phosphatase: 131 U/L — ABNORMAL HIGH (ref 38–126)
Anion gap: 4 — ABNORMAL LOW (ref 5–15)
BUN: 22 mg/dL (ref 8–23)
CO2: 22 mmol/L (ref 22–32)
Calcium: 8.4 mg/dL — ABNORMAL LOW (ref 8.9–10.3)
Chloride: 109 mmol/L (ref 98–111)
Creatinine, Ser: 1.22 mg/dL — ABNORMAL HIGH (ref 0.44–1.00)
GFR, Estimated: 44 mL/min — ABNORMAL LOW (ref >60)
Glucose, Bld: 124 mg/dL — ABNORMAL HIGH (ref 70–99)
Potassium: 4.6 mmol/L (ref 3.5–5.1)
Sodium: 135 mmol/L (ref 135–145)
Total Bilirubin: 0.6 mg/dL (ref 0.3–1.2)
Total Protein: 4.4 g/dL — ABNORMAL LOW (ref 6.5–8.1)

## 2022-11-10 LAB — CBC WITH DIFFERENTIAL/PLATELET
Abs Immature Granulocytes: 1.37 10*3/uL — ABNORMAL HIGH (ref 0.00–0.07)
Basophils Absolute: 0.1 10*3/uL (ref 0.0–0.1)
Basophils Relative: 1 %
Eosinophils Absolute: 0 10*3/uL (ref 0.0–0.5)
Eosinophils Relative: 0 %
HCT: 23.4 % — ABNORMAL LOW (ref 36.0–46.0)
Hemoglobin: 7.7 g/dL — ABNORMAL LOW (ref 12.0–15.0)
Immature Granulocytes: 7 %
Lymphocytes Relative: 9 %
Lymphs Abs: 1.8 10*3/uL (ref 0.7–4.0)
MCH: 32.2 pg (ref 26.0–34.0)
MCHC: 32.9 g/dL (ref 30.0–36.0)
MCV: 97.9 fL (ref 80.0–100.0)
Monocytes Absolute: 1.5 10*3/uL — ABNORMAL HIGH (ref 0.1–1.0)
Monocytes Relative: 8 %
Neutro Abs: 15.1 10*3/uL — ABNORMAL HIGH (ref 1.7–7.7)
Neutrophils Relative %: 75 %
Platelets: 65 10*3/uL — ABNORMAL LOW (ref 150–400)
RBC: 2.39 MIL/uL — ABNORMAL LOW (ref 3.87–5.11)
RDW: 17.5 % — ABNORMAL HIGH (ref 11.5–15.5)
WBC: 19.9 10*3/uL — ABNORMAL HIGH (ref 4.0–10.5)
nRBC: 0.5 % — ABNORMAL HIGH (ref 0.0–0.2)

## 2022-11-10 LAB — MAGNESIUM: Magnesium: 1.6 mg/dL — ABNORMAL LOW (ref 1.7–2.4)

## 2022-11-10 LAB — GLUCOSE, CAPILLARY: Glucose-Capillary: 100 mg/dL — ABNORMAL HIGH (ref 70–99)

## 2022-11-10 MED ORDER — METRONIDAZOLE 500 MG PO TABS: 500.0000 mg | ORAL_TABLET | Freq: Two times a day (BID) | ORAL | 0 refills | Status: AC

## 2022-11-10 MED ORDER — HEPARIN SOD (PORK) LOCK FLUSH 100 UNIT/ML IV SOLN
500.0000 [IU] | INTRAVENOUS | Status: AC | PRN
  Administered 2022-11-10: 500 [IU]
  Filled 2022-11-10: qty 5

## 2022-11-10 MED ORDER — METOPROLOL TARTRATE 5 MG/5ML IV SOLN: 5.0000 mg | INTRAVENOUS | Status: DC | PRN

## 2022-11-10 MED ORDER — TRAZODONE HCL 50 MG PO TABS: 50.0000 mg | ORAL_TABLET | Freq: Every evening | ORAL | Status: DC | PRN

## 2022-11-10 MED ORDER — IPRATROPIUM-ALBUTEROL 0.5-2.5 (3) MG/3ML IN SOLN: 3.0000 mL | RESPIRATORY_TRACT | Status: DC | PRN

## 2022-11-10 MED ORDER — SENNOSIDES-DOCUSATE SODIUM 8.6-50 MG PO TABS: 1.0000 | ORAL_TABLET | Freq: Every evening | ORAL | Status: DC | PRN

## 2022-11-10 MED ORDER — HYDRALAZINE HCL 20 MG/ML IJ SOLN: 10.0000 mg | INTRAMUSCULAR | Status: DC | PRN

## 2022-11-10 NOTE — Progress Notes (Signed)
I have reviewed her chart and documentation over the weekend When I walked in, the patient said that she is not ready for palliative care/hospice When I inquired who she will call if she gets into trouble again after discharge, she stated she will call me for any questions or concerns  I told the patient, as we transition her care without palliative chemotherapy, it is prudent that she get palliative care services.  I understand that she is not ready to transition to hospice care but we discussed further services that could be provided by palliative care  Ultimately, the patient expressed understanding.  From medical perspective, she can be discharged.  I will send referral for home-based palliative care from my office  I will sign off.  I have addressed all her questions

## 2022-11-10 NOTE — Telephone Encounter (Signed)
(  4:15 pm) PC SW scheduled an initial palliative care visit with patient. RN/SW team to visit patient on 11/12/22 @ 11am.

## 2022-11-10 NOTE — Care Management Important Message (Signed)
Important Message  Patient Details IM Letter placed in Patient's room. Name: Kayla Baker MRN: [id number] Date of Birth: [birth year]   Medicare Important Message Given:  Yes     Kerin Salen 11/10/2022, 9:40 AM

## 2022-11-10 NOTE — Discharge Summary (Signed)
Physician Discharge Summary  Kayla Baker HCW:[id number] DOB: 1936-01-04 DOA: 11/06/2022  PCP: [name], MD  Admit date: 11/06/2022 Discharge date: 11/10/2022  Admitted From: Home Disposition:  Home  Recommendations for Outpatient Follow-up:  Follow up with PCP in 1-2 weeks Please obtain BMP/CBC in one week your next doctors visit.  Oncology to arrange for outpatient palliative Continue outpatient Duricef to be taken twice daily.  For next 5 days have also added Flagyl twice daily.   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 CT head was negative, CT abdomen pelvis showed moderate ascites with inflammatory stranding around left colon. Patient was found to be neutropenic started on broad-spectrum antibiotics. Case was discussed by previous provider with oncology.  After patient had discussion with oncology was determined to follow-up outpatient likely with palliative care services.  Today patient is medically stable for discharge Recommendations as mentioned above.  Sepsis, presumed due to presence of severe neutropenia, altered mental status.  Multiple possible source of infection. 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 Left-sided colitis seen on CT scan. Now developing diarrhea after hospitalization. -All cultures remain negative at this time, no obvious source of infection, possible colitis.  Patient is tolerating oral.  Will continue home Duricef that she takes twice  daily, additionally added Flagyl for 5 days twice daily.    Metabolic encephalopathy, infective metabolic encephalopathy: Resolved Mentation has resolved, nonfocal   Ovarian cancer on Taxol, severe pancytopenia: Oncology following.  There is recommended that she would be a good candidate for hospice but patient is not ready for this and would like to follow-up outpatient.     Essential hypertension: Blood pressure stable.   Type 2 diabetes, well-controlled: resume home meds    Discharge Diagnoses:  Principal Problem:   Sepsis ([place]) Active Problems:   Prosthetic hip infection ([place])   Essential hypertension   Chemotherapy-induced peripheral neuropathy ([facility])   CKD stage 3 due to type 2 diabetes mellitus ([place])   Ovarian cancer ([place])   Other ascites   Pancytopenia, acquired ([place])   Enteritis   Need for emotional support   Counseling and coordination of care    Consultations: Oncology  Subjective: Feels ok no new complaints.   Discharge Exam: Vitals:   11/09/22 2122 11/10/22 0519  BP: 118/61 (!) 116/58  Pulse: 92 89  Resp: 17 16  Temp: 98.2 F (36.8 C) 98.4 F (36.9 C)  SpO2: 94% 93%   Vitals:   11/09/22 0450 11/09/22 1508 11/09/22 2122 11/10/22 0519  BP: (!) 114/58 (!) 114/57 118/61 (!) 116/58  Pulse: 96 98 92 89  Resp: 18 16 17 16   Temp: 98 F (36.7 C) 98.1 F (36.7 C) 98.2 F (36.8 C) 98.4 F (36.9 C)  TempSrc: Oral Oral Oral Oral  SpO2: 97% 100% 94% 93%  Weight:      Height:        General: Pt is alert, awake, not in acute distress Cardiovascular: RRR, S1/S2 +, no rubs, no gallops Respiratory: CTA bilaterally, no wheezing, no rhonchi Abdominal: Soft, NT, ND, bowel sounds + Extremities: no edema, no cyanosis  Discharge Instructions   Allergies as  of 11/10/2022       Reactions   Mobic [meloxicam] Swelling   Prednisone Swelling, Other (See Comments)   Can Not take High doses   Premarin [estrogens Conjugated] Hives   Trovan [alatrofloxacin]  Other (See Comments)   "I don't tolerate this well"        Medication List     TAKE these medications    acetaminophen 500 MG tablet Commonly known as: TYLENOL Take 1,000 mg by mouth every 6 (six) hours as needed for mild pain, headache or fever.   allopurinol 100 MG tablet Commonly known as: ZYLOPRIM Take 1 tablet (100 mg total) by mouth daily. FOR GOUT PREVENTION, What changed:  when to take this additional instructions   ascorbic acid 500 MG tablet Commonly known as: VITAMIN C Take 500 mg by mouth every evening.   aspirin EC 81 MG tablet Take 1 tablet (81 mg total) by mouth 2 (two) times daily. Swallow whole.   cefadroxil 500 MG capsule Commonly known as: DURICEF Take 2 capsules (1,000 mg total) by mouth 2 (two) times daily.   cyanocobalamin 1000 MCG tablet Take 1,000 mcg by mouth daily.   dexamethasone 4 MG tablet Commonly known as: DECADRON Take 2 tablets the morning before chemotherapy and then 2 tablets daily for 2 days after chemo What changed:  how much to take how to take this when to take this additional instructions   Ensure Max Protein Liqd Take 330 mLs (11 oz total) by mouth 2 (two) times daily.   ezetimibe 10 MG tablet Commonly known as: ZETIA TAKE 1 TABLET BY MOUTH DAILY FOR CHOLESTEROL What changed:  how much to take how to take this when to take this additional instructions   freestyle lancets USE TO CHECK BLOOD GLUCOSE ONCE DAILY AS DIRECTED   gabapentin 300 MG capsule Commonly known as: NEURONTIN Take 1 capsule 4 x  /day for Pain What changed:  how much to take how to take this when to take this additional instructions   glimepiride 1 MG tablet Commonly known as: Amaryl Take 1 tab by mouth in the morning if fasting is running 150+. Please do not take this medication if you are ill or not eating as this can cause a low blood sugar.   glucose blood test strip Use as instructed   lidocaine-prilocaine cream Commonly known  as: EMLA Apply 1 application. topically daily as needed (for port access).   magnesium oxide 400 (240 Mg) MG tablet Commonly known as: MAG-OX Take 1 tablet (400 mg total) by mouth daily.   metoprolol succinate 25 MG 24 hr tablet Commonly known as: TOPROL-XL Take  1 tablet  Daily  for BP                                                          /                                       TAKE                            BY  MOUTH What changed:  how much to take how to take this when to take this additional instructions   metroNIDAZOLE 500 MG tablet Commonly known as: FLAGYL Take 1 tablet (500 mg total) by mouth every 12 (twelve) hours for 5 days.   ondansetron 8 MG tablet Commonly known as: Zofran Take 1 tablet (8 mg total) by mouth every 8 (eight) hours as needed for refractory nausea / vomiting.   oxyCODONE 5 MG immediate release tablet Commonly known as: Oxy IR/ROXICODONE Take 0.5-1 tablets (2.5-5 mg total) by mouth every 4 (four) hours as needed for severe pain or moderate pain.   pantoprazole 40 MG tablet Commonly known as: PROTONIX TAKE 1 TABLET(40 MG) BY MOUTH DAILY What changed: See the new instructions.   polyethylene glycol 17 g packet Commonly known as: MiraLax Take 17 g by mouth 2 (two) times daily. 17 grams in 6 oz of favorite drink twice a day until bowel movement.  LAXITIVE.  Restart if two days since last bowel movement What changed:  when to take this reasons to take this additional instructions   rosuvastatin 5 MG tablet Commonly known as: CRESTOR TAKE 1 TABLET BY MOUTH IN THE EVENING THREE TIMES WEEKLY FOR CHOLESTEROL What changed: See the new instructions.   trolamine salicylate 10 % cream Commonly known as: ASPERCREME Apply 1 application  topically as needed (to both feet for neuropathic pain).   Vitamin D3 125 MCG (5000 UT) Caps Take 5,000 Units by mouth daily.        Follow-up Information     [name],  MD. Schedule an appointment as soon as possible for a visit in 1 week(s).   Specialty: Internal Medicine Contact information: [address] [place] [address] [phone]                Allergies  Allergen Reactions   Mobic [Meloxicam] Swelling   Prednisone Swelling and Other (See Comments)    Can Not take High doses   Premarin [Estrogens Conjugated] Hives   Trovan [Alatrofloxacin] Other (See Comments)    "I don't tolerate this well"    You were cared for by a hospitalist during your hospital stay. If you have any questions about your discharge medications or the care you received while you were in the hospital after you are discharged, you can call the unit and asked to speak with the hospitalist on call if the hospitalist that took care of you is not available. Once you are discharged, your primary care physician will handle any further medical issues. Please note that no refills for any discharge medications will be authorized once you are discharged, as it is imperative that you return to your primary care physician (or establish a relationship with a primary care physician if you do not have one) for your aftercare needs so that they can reassess your need for medications and monitor your lab values.   Procedures/Studies: CT Head Wo Contrast  Result Date: 11/06/2022 CLINICAL DATA:  Provided history: Headache, sudden, severe. EXAM: CT HEAD WITHOUT CONTRAST TECHNIQUE: Contiguous axial images were obtained from the base of the skull through the vertex without intravenous contrast. RADIATION DOSE REDUCTION: This exam was performed according to the departmental dose-optimization program which includes automated exposure control, adjustment of the mA and/or kV according to patient size and/or use of iterative reconstruction technique. COMPARISON:  Head CT 08/22/2022. Brain MRI 09/24/2006. FINDINGS: Brain: Mild-to-moderate generalized cerebral atrophy. Mild patchy  and ill-defined hypoattenuation within the cerebral white matter,  nonspecific but compatible with chronic small vessel ischemic disease. There is no acute intracranial hemorrhage. No demarcated cortical infarct. No extra-axial fluid collection. No evidence of an intracranial mass. No midline shift. Vascular: No hyperdense vessel.  Atherosclerotic calcifications. Skull: No fracture or aggressive osseous lesion. Sinuses/Orbits: No mass or acute finding within the imaged orbits. No significant paranasal sinus disease at the imaged levels Other: Partially imaged chronic bilateral frontal process of maxilla fractures. IMPRESSION: No evidence of acute intracranial abnormality. Mild chronic small vessel ischemic changes within the cerebral white matter. Mild-to-moderate generalized cerebral atrophy. Electronically Signed   By: Kellie Simmering D.O.   On: 11/06/2022 16:21   CT ABDOMEN PELVIS W CONTRAST  Result Date: 11/06/2022 CLINICAL DATA:  Acute abdominal pain and distension. Black stool. History of ovarian cancer. EXAM: CT ABDOMEN AND PELVIS WITH CONTRAST TECHNIQUE: Multidetector CT imaging of the abdomen and pelvis was performed using the standard protocol following bolus administration of intravenous contrast. RADIATION DOSE REDUCTION: This exam was performed according to the departmental dose-optimization program which includes automated exposure control, adjustment of the mA and/or kV according to patient size and/or use of iterative reconstruction technique. CONTRAST:  179m OMNIPAQUE IOHEXOL 300 MG/ML  SOLN COMPARISON:  CT abdomen and pelvis 10/28/2022 FINDINGS: Lower chest: No acute abnormality. Rounded low-attenuation area in the right lung base along the hemidiaphragm has slightly increased in size measuring up to 2 cm (previously 1.4 cm. This may represent small diaphragmatic hernia containing ascites or low-density nodule. Hepatobiliary: No focal liver abnormality is seen. No gallstones, gallbladder wall  thickening, or biliary dilatation. Pancreas: Unremarkable. No pancreatic ductal dilatation or surrounding inflammatory changes. Spleen: Normal in size without focal abnormality. Adrenals/Urinary Tract: Limited evaluation of the bladder secondary to streak artifact in the pelvis. The kidneys appears stable. There is a cyst in the left kidney measuring 13 mm. No hydronephrosis or perinephric fat stranding. The adrenal glands are within normal limits. Stomach/Bowel: No evidence for bowel obstruction, pneumatosis or free air. There is wall thickening and mild inflammatory stranding of the sigmoid colon to the level the rectum. Stomach is decompressed and not well evaluated. There is also some mild wall thickening of jejunal loops in the left abdomen. The appendix is not visualized. Vascular/Lymphatic: Aortic atherosclerosis. No enlarged abdominal or pelvic lymph nodes. Reproductive: Status post hysterectomy. No adnexal masses. Other: There is a moderate-to-large amount of ascites throughout the abdomen and pelvis which has increased from prior. Peritoneal nodularity in the right abdomen measuring up to 11 mm and in the anterior left abdomen measuring up to 5 mm appears unchanged. Small scattered nodules or lymph nodes in the pelvis adjacent to the sigmoid colon have not significantly changed. Musculoskeletal: Right hip arthroplasty is present. No acute fracture or focal osseous lesion. IMPRESSION: 1. Wall thickening and inflammatory stranding of the sigmoid colon and rectum compatible with colitis/proctitis. 2. Mild wall thickening of jejunal loops in the left abdomen compatible with enteritis. 3. Moderate-to-large amount of ascites throughout the abdomen and pelvis has increased from prior. 4. Stable peritoneal nodularity compatible with metastatic disease. 5. Rounded low-attenuation area in the right lung base along the hemidiaphragm has slightly increased in size. This may represent small diaphragmatic hernia  containing ascites or low-density nodule. Aortic Atherosclerosis (ICD10-I70.0). Electronically Signed   By: ARonney AstersM.D.   On: 11/06/2022 16:18   CT CHEST ABDOMEN PELVIS W CONTRAST  Result Date: 10/29/2022 CLINICAL DATA:  Ovarian cancer; * Tracking Code: BO * EXAM: CT CHEST, ABDOMEN, AND PELVIS WITH  CONTRAST TECHNIQUE: Multidetector CT imaging of the chest, abdomen and pelvis was performed following the standard protocol during bolus administration of intravenous contrast. RADIATION DOSE REDUCTION: This exam was performed according to the departmental dose-optimization program which includes automated exposure control, adjustment of the mA and/or kV according to patient size and/or use of iterative reconstruction technique. CONTRAST:  16m OMNIPAQUE IOHEXOL 300 MG/ML  SOLN COMPARISON:  CT abdomen and pelvis dated August 24, 2022; CT chest, abdomen and pelvis dated August 23, 2021 FINDINGS: CT CHEST FINDINGS Cardiovascular: Normal heart size. No pericardial effusion. Normal caliber thoracic aorta with moderate atherosclerotic disease. No coronary artery calcifications. Mediastinum/Nodes: Small hiatal hernia. Thyroid is unremarkable. No pathologically enlarged lymph nodes seen in the chest. Lungs/Pleura: Central airways are patent. Mild bibasilar atelectasis. No consolidation, pleural effusion or pneumothorax. Subpleural nodule overlying the right hemidiaphragm is increased in size measuring 2.0 x 1.3 cm on series 6, image 100, previously measured up to 3 mm. Musculoskeletal: Stable sclerotic lesion of the left scapula, likely a bone island. No aggressive appearing osseous lesions seen in the region of the chest. CT ABDOMEN PELVIS FINDINGS Hepatobiliary: No focal liver abnormality is seen. No gallstones, gallbladder wall thickening, or biliary dilatation. Pancreas: Unremarkable. No pancreatic ductal dilatation or surrounding inflammatory changes. Spleen: Stable splenomegaly Adrenals/Urinary Tract:  Bilateral adrenal glands are unremarkable. No hydronephrosis or nephrolithiasis. No suspicious renal lesions. Bladder is unremarkable. Stomach/Bowel: Stomach is within normal limits. Diverticulosis. No evidence of bowel wall thickening, distention, or inflammatory changes. Vascular/Lymphatic: No significant vascular findings are present. No enlarged abdominal or pelvic lymph nodes. Reproductive: No adnexal masses. Other: Trace abdominal ascites and peritoneal nodularity reference peritoneal nodule of the right hemiabdomen measuring 9 mm on series 2, image 77, unchanged. Stable pelvic soft tissue nodule measuring 2.0 x 1.8 cm on series 4, image 65, unchanged when compared with prior and remeasured in similar plane. Musculoskeletal: Prior total right hip replacement. Previously described fluid collection of the right gluteal muscles is longer visualized. No aggressive appearing osseous lesions. IMPRESSION: 1. Increased size of subpleural nodule overlying the right hemidiaphragm, concerning for progressive metastatic disease. 2. Trace abdominal ascites with stable peritoneal nodularity. Electronically Signed   By: LYetta GlassmanM.D.   On: 10/29/2022 13:32     The results of significant diagnostics from this hospitalization (including imaging, microbiology, ancillary and laboratory) are listed below for reference.     Microbiology: Recent Results (from the past 240 hour(s))  Blood culture (routine x 2)     Status: None (Preliminary result)   Collection Time: 11/06/22  5:30 PM   Specimen: BLOOD  Result Value Ref Range Status   Specimen Description   Final    BLOOD BLOOD RIGHT HAND Performed at [facility] [address], [place] [address]   Special Requests   Final    BOTTLES DRAWN AEROBIC AND ANAEROBIC Blood Culture adequate volume Performed at [address], [place] [id number]   Culture   Final    NO GROWTH 4  DAYS Performed at [facility] [address] [place] [address]   Report Status PENDING  Incomplete  Blood culture (routine x 2)     Status: None (Preliminary result)   Collection Time: 11/06/22  5:30 PM   Specimen: BLOOD  Result Value Ref Range Status   Specimen Description   Final    BLOOD RIGHT ANTECUBITAL Performed at [facility]  [address] [place], [place] [address]    Special Requests   Final    BOTTLES DRAWN AEROBIC AND ANAEROBIC Blood Culture results may not be optimal due to an excessive volume of blood received in culture bottles Performed at [facility] [address], [place], [place] [address]    Culture   Final    NO GROWTH 4 DAYS Performed at [facility], [place] [address], [place], [place] [address]    Report Status PENDING  Incomplete  Urine Culture     Status: Abnormal   Collection Time: 11/06/22  5:33 PM   Specimen: Urine, Clean Catch  Result Value Ref Range Status   Specimen Description   Final    URINE, CLEAN CATCH Performed at [facility], [place] [address] [place], [place] [address]    Special Requests   Final    NONE Performed at [facility], [place] [address] [place],  [address]    Culture (A)  Final    20,000 COLONIES/mL KLEBSIELLA OXYTOCA Confirmed Extended Spectrum Beta-Lactamase Producer (ESBL).  In bloodstream infections from ESBL organisms, carbapenems are preferred over piperacillin/tazobactam. They are shown to have a lower risk of mortality.    Report Status 11/08/2022 FINAL  Final   Organism ID, Bacteria KLEBSIELLA OXYTOCA (A)  Final      Susceptibility   Klebsiella oxytoca - MIC*    AMPICILLIN >=32 RESISTANT Resistant     CEFAZOLIN >=64 RESISTANT Resistant     CEFEPIME 1 SENSITIVE Sensitive     CEFTRIAXONE 8 RESISTANT Resistant     CIPROFLOXACIN <=0.25 SENSITIVE Sensitive     GENTAMICIN <=1 SENSITIVE Sensitive     IMIPENEM  0.5 SENSITIVE Sensitive     NITROFURANTOIN 32 SENSITIVE Sensitive     TRIMETH/SULFA <=20 SENSITIVE Sensitive     AMPICILLIN/SULBACTAM >=32 RESISTANT Resistant     PIP/TAZO >=128 RESISTANT Resistant     * 20,000 COLONIES/mL KLEBSIELLA OXYTOCA     Labs: BNP (last 3 results) No results for input(s): "BNP" in the last 8760 hours. Basic Metabolic Panel: Recent Labs  Lab 11/06/22 1344 11/06/22 1737 11/08/22 0910 11/09/22 0517 11/10/22 0500  NA 139  --   --  137 135  K 3.2*  --   --  5.0 4.6  CL 108  --   --  111 109  CO2 25  --   --  21* 22  GLUCOSE 136*  --   --  108* 124*  BUN 32*  --   --  23 22  CREATININE 1.01*  --  1.13* 1.24* 1.22*  CALCIUM 8.7*  --   --  8.6* 8.4*  MG  --  1.3*  --  1.4* 1.6*  PHOS  --  1.7* 2.0* 3.5  --    Liver Function Tests: Recent Labs  Lab 11/06/22 1344 11/09/22 0517 11/10/22 0500  AST 34 33 28  ALT 24 19 16   ALKPHOS 96 127* 131*  BILITOT 1.2 0.7 0.6  PROT 5.9* 4.5* 4.4*  ALBUMIN 2.6* 1.9* 1.8*   Recent Labs  Lab 11/06/22 1344  LIPASE 29   Recent Labs  Lab 11/06/22 1344  AMMONIA 29   CBC: Recent Labs  Lab 11/06/22 1344 11/07/22 0409 11/08/22 0910 11/09/22 0517 11/10/22 0500  WBC 0.7* 1.8* 21.0* 22.3* 19.9*  NEUTROABS 0.1* 0.5* 14.8* 16.2* 15.1*  HGB 9.6* 8.9* 8.7* 8.1* 7.7*  HCT 30.1* 27.5* 27.9* 24.9* 23.4*  MCV 98.4 98.2 99.6 97.6 97.9  PLT 54* 49* 72* 66* 65*   Cardiac Enzymes: No results for input(s): "CKTOTAL", "CKMB", "CKMBINDEX", "TROPONINI" in the last 168 hours. BNP: Invalid input(s): "POCBNP" CBG: Recent Labs  Lab 11/09/22 0736 11/09/22 1203 11/09/22 1721 11/09/22 2124 11/10/22 0749  GLUCAP 114* 139* 179* 128* 100*   D-Dimer No results for input(s): "DDIMER" in the last 72 hours. Hgb A1c No results for input(s): "HGBA1C" in the last 72 hours. Lipid Profile No results for input(s): "CHOL", "HDL", "LDLCALC", "TRIG", "CHOLHDL", "LDLDIRECT" in the last 72 hours. Thyroid function studies No results  for input(s): "TSH", "T4TOTAL", "T3FREE", "THYROIDAB" in the last 72 hours.  Invalid input(s): "FREET3" Anemia work up No results for input(s): "VITAMINB12", "FOLATE", "FERRITIN", "TIBC", "IRON", "RETICCTPCT" in the last 72 hours. Urinalysis    Component Value Date/Time   COLORURINE YELLOW 11/06/2022 1849   APPEARANCEUR CLEAR 11/06/2022 1849   LABSPEC >1.046 (H) 11/06/2022 1849   PHURINE 5.0 11/06/2022 1849   GLUCOSEU NEGATIVE 11/06/2022 1849   HGBUR NEGATIVE 11/06/2022 1849   BILIRUBINUR NEGATIVE 11/06/2022 1849   KETONESUR NEGATIVE 11/06/2022 1849   PROTEINUR NEGATIVE 11/06/2022 1849   NITRITE NEGATIVE 11/06/2022 1849   LEUKOCYTESUR NEGATIVE 11/06/2022 1849   Sepsis Labs Recent Labs  Lab 11/07/22 0409 11/08/22 0910 11/09/22 0517 11/10/22 0500  WBC 1.8* 21.0* 22.3* 19.9*   Microbiology Recent Results (from the past 240 hour(s))  Blood culture (routine x 2)     Status: None (Preliminary result)   Collection Time: 11/06/22  5:30 PM   Specimen: BLOOD  Result Value Ref Range Status   Specimen Description   Final    BLOOD BLOOD RIGHT HAND Performed at [facility], [place] [address] [place], [place] [address]    Special Requests   Final    BOTTLES DRAWN AEROBIC AND ANAEROBIC Blood Culture adequate volume Performed at [place] [address] [place], [place] [address]    Culture   Final    NO GROWTH 4 DAYS Performed at [facility], [place] [address] [place], [place] [address]    Report Status PENDING  Incomplete  Blood culture (routine x 2)     Status: None (Preliminary result)   Collection Time: 11/06/22  5:30 PM   Specimen: BLOOD  Result Value Ref Range Status   Specimen Description   Final    BLOOD RIGHT ANTECUBITAL Performed at [facility] [address], [place], [place] [address]    Special Requests   Final    BOTTLES DRAWN AEROBIC AND ANAEROBIC Blood Culture results may not be optimal due to  an excessive volume of blood received in culture bottles Performed at [place] [address], [place], [place] [address]    Culture   Final    NO GROWTH 4 DAYS Performed at [facility], [place] [address], [place], [place] [address]    Report Status PENDING  Incomplete  Urine Culture     Status: Abnormal   Collection Time: 11/06/22  5:33 PM   Specimen: Urine, Clean Catch  Result Value Ref Range Status   Specimen Description   Final    URINE, CLEAN CATCH Performed at [facility], [place] [address] [place], [place] [address]    Special Requests   Final    NONE Performed at [facility], [place] [address], [place], [place] [address]    Culture (A)  Final    20,000 COLONIES/mL KLEBSIELLA OXYTOCA Confirmed Extended  Spectrum Beta-Lactamase Producer (ESBL).  In bloodstream infections from ESBL organisms, carbapenems are preferred over piperacillin/tazobactam. They are shown to have a lower risk of mortality.    Report Status 11/08/2022 FINAL  Final   Organism ID, Bacteria KLEBSIELLA OXYTOCA (A)  Final      Susceptibility   Klebsiella oxytoca - MIC*    AMPICILLIN >=32 RESISTANT Resistant     CEFAZOLIN >=64 RESISTANT Resistant     CEFEPIME 1 SENSITIVE Sensitive     CEFTRIAXONE 8 RESISTANT Resistant     CIPROFLOXACIN <=0.25 SENSITIVE Sensitive     GENTAMICIN <=1 SENSITIVE Sensitive     IMIPENEM 0.5 SENSITIVE Sensitive     NITROFURANTOIN 32 SENSITIVE Sensitive     TRIMETH/SULFA <=20 SENSITIVE Sensitive     AMPICILLIN/SULBACTAM >=32 RESISTANT Resistant     PIP/TAZO >=128 RESISTANT Resistant     * 20,000 COLONIES/mL KLEBSIELLA OXYTOCA     Time coordinating discharge:  I have spent 35 minutes face to face with the patient and on the ward discussing the patients care, assessment, plan and disposition with other care givers. >50% of the time was devoted counseling the patient about the risks and benefits of treatment/Discharge  disposition and coordinating care.   SIGNED:   Damita Lack, MD  [facility] 11/10/2022, 12:14 PM   If 7PM-7AM, please contact night-coverage

## 2022-11-10 NOTE — Progress Notes (Signed)
[facility] Hospital Liaison note: ° °This patient is currently enrolled in [facility] outpatient-based Palliative Care. Will continue to follow for disposition. ° °Please call with any outpatient palliative questions or concerns. ° °Thank you, °Dee Curry, LPN °[facility] Hospital Liaison °[phone] °

## 2022-11-10 NOTE — Progress Notes (Signed)
Order placed for Palliative care referral to [facility]. Spoke with Inez Catalina from [facility] who requested secure chat with Pt information and advised that someone would look into referral in the next 48 hours. Will follow up to ensure order is processed.

## 2022-11-11 LAB — CULTURE, BLOOD (ROUTINE X 2)
Culture: NO GROWTH
Culture: NO GROWTH
Special Requests: ADEQUATE

## 2022-11-12 ENCOUNTER — Other Ambulatory Visit: Payer: PPO

## 2022-11-12 ENCOUNTER — Other Ambulatory Visit: Payer: Self-pay

## 2022-11-12 DIAGNOSIS — Z515 Encounter for palliative care: Secondary | ICD-10-CM | POA: Diagnosis not present

## 2022-11-12 NOTE — Progress Notes (Signed)
1100- Palliative Care Note  RN/SW team met with Kayla Baker and husband for post hospitalization visit in home.  Kayla Baker was recently taken to ED with c/o weakness and spouse reporting confusion. admitted with sepsis.  Cognitive: Kayla Baker alert and oriented x 3. No issues holding a conversation with RN/SW. Asking appropriate questions. No hesitation in answering questions either.   Pain: Kayla Baker denies pain at present. States, "when I do have pain it is usually in my right hip. I fractured my hip and had to have a replacement. Then I got an infection in it."   Musculoskeletal: Kayla Baker voices that she is weak. She uses walker for ambulation since surgery. She is also noted to have 2+ edema to both feet and ankles L>R. States that her feet have swelled since her hip replacement. Does report that swelling increases thru out the day. Reports that her doctors are aware. She does have TED stockings. RN instructed on importance of keeping feet elevated when sitting.   GI/GU: Kayla Baker denies difficulty or issues with voiding. Reports that she was having diarrhea prior to hospitalization. Kayla Baker was discharged home on antibiotics and flagyl. RN discussed possible side effects with Kayla Baker. Reports having a poor appetite. Says everything tastes bad. Spouse reports that Kayla Baker still tries to eat some even if it tastes bad. Kayla Baker also reports that husband bought ensure for her to drink since she tolerated it in hospital. RN and SW discussed importance of trying to maintain caloric intake and protein consumption.   Pall Care/ Hospice: Discussed with Kayla Baker and spouse the roles of palliative care and hospice. Answered questions that Kayla Baker and spouse had.   Goals: Kayla Baker voiced that she does want to be at home if at all possible. Kayla Baker is full code and was receptive to information regarding most form. Asked questions regarding "damage caused by CPR" which were answered by RN/SW. Husband is POA. Encouraged Kayla Baker to talk to spouse and family about ACP.  CODE STATUS: Full ADVANCED  DIRECTIVES: No MOST FORM: No Healthcare POA: Spouse  Next in home appt scheduled for 11/26/22 at 11am.     , RN, BSN   

## 2022-11-13 ENCOUNTER — Telehealth: Payer: Self-pay

## 2022-11-13 NOTE — Telephone Encounter (Signed)
Faxed latest office visit, latest MD progress note, demographics, insurance card, and pathology to [facility] at [phone] per Pt request for second opinion. Received confirmation.

## 2022-11-13 NOTE — Progress Notes (Signed)
COMMUNITY PALLIATIVE CARE SW NOTE  PATIENT NAME: Kayla Baker DOB: 30-Jul-1936 MRN: [id number]  PRIMARY CARE PROVIDER: [name], MD  RESPONSIBLE PARTY:  Acct ID - Guarantor Home Phone Work Phone Relationship Acct Type  [id number] RAENETTE, SAKATA [phone]  Self P/F     [address], [place], [place] [address]   SW and RN-J. Livengood completed a follow-up visit with patient as her home post hospitalization. The team reiterated role of palliative care in patient's care and contact information and encouraged her to call with changes or needed support. Patient diagnosis is ovarian cancer. Patient has elected no further treatment for cancer.  PLAN OF CARE:   Patient was hospitalized from 12/7-12/10/2022 due to increased weakness, confusion and sepsis.  Patient is alert and oriented x3 and was able to provide a status update on herself, along with her husband. Her stepdaughter-Nicole arrived at the end of the visit.   Patient denied pain, bur report she has occasional pain to her right hip as a result of a fractured hip that was replaced that later got infected. Patient has swelling to both feet, but the left is swollen more. She report that her feet swells throughout the day despite elevating her feet. Patient report weakness overall. She ambulates with a walker for safety. She has increased anxiety around falling. She also wears non-slip socks.   Patient report difficulty voiding. She is also having diarrhea prior to her hospitalization. She is currently taking an ABT and flagyl. Her appetite is poor, patient stated " I don't like food". Patient report that she tries to eat 3 small meals. Breakfast is her best meal. She also drinks at least 1 ensure. Patient's husband assist her with ADL's and personal care.   Interventions:  RN instructed on importance of keeping feet elevated when sitting and wear TED hose regularly.  RN and SW discussed importance of trying to maintain caloric  intake and protein consumption.  Education regarding hospice care, how it differs from hospice and benefits of the service. Education regarding advance directives: DNR & MOST from  GOALS: Patient desires to be comfortable and to be able to go to the beach. She does not want to be confined to a bed. She also does not want to be away from her husband and wants to stay at home with him for as long as possible. Patient's husband is her POA/HCPOA.  SOCIAL HX: Patient has been married for 35 years. She has two step children. She is also close to her sister, nieces and nephews. Patient is a Engineer, manufacturing.  Social History   Tobacco Use   Smoking status: Never   Smokeless tobacco: Never  Substance Use Topics   Alcohol use: No    CODE STATUS: Full Code ADVANCED DIRECTIVES: No, desires to complete MOST FORM COMPLETE:  No, education provided HOSPICE EDUCATION PROVIDED: Yes, provided  Duration of visit and documentation: 60 minutes   Fleta Borgeson, LCSW

## 2022-11-13 NOTE — Progress Notes (Signed)
1100- Palliative Care Note  RN/SW team met with pt and husband for post hospitalization visit in home.  Pt was recently taken to ED with c/o weakness and spouse reporting confusion. admitted with sepsis.  Cognitive: pt alert and oriented x 3. No issues holding a conversation with RN/SW. Asking appropriate questions. No hesitation in answering questions either.   Pain: Pt denies pain at present. States, "when I do have pain it is usually in my right hip. I fractured my hip and had to have a replacement. Then I got an infection in it."   Musculoskeletal: Pt voices that she is weak. She uses walker for ambulation since surgery. She is also noted to have 2+ edema to both feet and ankles L>R. States that her feet have swelled since her hip replacement. Does report that swelling increases thru out the day. Reports that her doctors are aware. She does have TED stockings. RN instructed on importance of keeping feet elevated when sitting.   GI/GU: Pt denies difficulty or issues with voiding. Reports that she was having diarrhea prior to hospitalization. Pt was discharged home on antibiotics and flagyl. RN discussed possible side effects with pt. Reports having a poor appetite. Says everything tastes bad. Spouse reports that pt still tries to eat some even if it tastes bad. Pt also reports that husband bought ensure for her to drink since she tolerated it in hospital. RN and SW discussed importance of trying to maintain caloric intake and protein consumption.   Pall Care/ Hospice: Discussed with pt and spouse the roles of palliative care and hospice. Answered questions that pt and spouse had.   Goals: Pt voiced that she does want to be at home if at all possible. Pt is full code and was receptive to information regarding most form. Asked questions regarding "damage caused by CPR" which were answered by RN/SW. Husband is POA. Encouraged pt to talk to spouse and family about ACP.  CODE STATUS: Full ADVANCED  DIRECTIVES: No MOST FORM: No Healthcare POA: Spouse  Next in home appt scheduled for 11/26/22 at 11am.    Judea Riches, RN, BSN   

## 2022-11-13 NOTE — Telephone Encounter (Signed)
I am not aware of any agents that can accomplish what she is asking for Stopping treatment will allow her immune system to recover in time

## 2022-11-13 NOTE — Telephone Encounter (Signed)
Pt left message asking if Dr. [name] has any other treatment options to help her boost her immune system and "live a happy and healthy life". Pt stated that she has been seen by palliative care and that she understands chemo has been D/C'd.

## 2022-11-13 NOTE — Telephone Encounter (Signed)
Called Pt to discuss below message. Pt verbalized understanding and asked for [place] to fax over information to [facility] for a 2nd opinion. Will fax referral.

## 2022-11-14 ENCOUNTER — Telehealth: Payer: Self-pay

## 2022-11-14 NOTE — Telephone Encounter (Signed)
845 am.  Incoming call from spouse requesting follow up on a shower chair/bench.  Message sent to team following patient.

## 2022-11-17 ENCOUNTER — Other Ambulatory Visit: Payer: Self-pay | Admitting: Hematology and Oncology

## 2022-11-17 ENCOUNTER — Encounter: Payer: Self-pay | Admitting: Nurse Practitioner

## 2022-11-17 ENCOUNTER — Telehealth: Payer: Self-pay

## 2022-11-17 DIAGNOSIS — R188 Other ascites: Secondary | ICD-10-CM

## 2022-11-17 NOTE — Telephone Encounter (Signed)
She called and left a message asking if Dr. [name] could order a paracentesis. She is bloated and uncomfortable with abdominal pain. Palliative care is supposed to be making a visit today. She is not asking for more chemo, per Cyndy.

## 2022-11-17 NOTE — Telephone Encounter (Signed)
I recommend her to wait for palliative care eval; they can arrange paracentesis if needed

## 2022-11-17 NOTE — Telephone Encounter (Signed)
1443- Palliative Care Note  Returned call to pt and spouse. Noted where pt had called in this weekend with c/o increased swelling to abd and legs. RN scheduled appt to see pt tomorrow am at 0900. Pt asked if an outpatient paracentesis would be an option, requested that RN send a message to MD. Message will be sent thru EPIC.   Jacqulyn Cane, RN

## 2022-11-17 NOTE — Telephone Encounter (Addendum)
Called and given appt for paracentesis on 12/20 at 10 am, arrive at 0945 at [facility], that is the earliest appt that I could schedule.. Told her Dr. [name] would order 1 time and in the future she would need to come into the office for a office visit to be evaluated. Instructed to go to ED to be evaluated for worsening symptoms. She verbalized understanding and appreciated the call.  She is asking for a refill on the oxycodone refill to [facility]. When she left the hospital they only gave her 15 tabs.

## 2022-11-18 ENCOUNTER — Other Ambulatory Visit: Payer: PPO

## 2022-11-18 ENCOUNTER — Other Ambulatory Visit: Payer: Self-pay | Admitting: Hematology and Oncology

## 2022-11-18 ENCOUNTER — Encounter: Payer: Self-pay | Admitting: Hematology and Oncology

## 2022-11-18 VITALS — BP 102/68 | HR 90 | Temp 97.7°F | Resp 18

## 2022-11-18 DIAGNOSIS — Z515 Encounter for palliative care: Secondary | ICD-10-CM | POA: Diagnosis not present

## 2022-11-18 MED ORDER — OXYCODONE HCL 5 MG PO TABS: 5.0000 mg | ORAL_TABLET | Freq: Four times a day (QID) | ORAL | 0 refills | Status: DC | PRN

## 2022-11-18 NOTE — Progress Notes (Signed)
0900 - Palliative Care Note  RN saw pt and spouse for in home visit for follow up.  Diagnosis: Pt has ovarian cancer. Treatment has been discontinued at this time. Pt wonders if there is a pill for chemo that could help her to improve, but states, " my cancer doctor has said that the chemo is just not working anymore."   Current issue is "the swelling in my stomach and legs." Talked with pt regarding interactions with dr office yesterday. Pt reports that she has a paracentesis scheduled for tomorrow, 11/19/22 at 0945 at [facility]. States last paracentesis was 2 months ago. Rates pain in abdomen as a 9 on 1/10 scale. Takes oxycodone as prescribed for pain. Last dose was last evening at 5 pm.   Pt abdomen is very swollen. Was not noticeably swollen on last in home visit last week. Also noted to have 3+ edema to bilateral lower extremities R>L. Pitting edema noted to Rt leg from toes to right hip. Leg is firm and skin is shiny. Pitting edema noted on left leg from toes up to approx 1 inch above knee. Leg is firm and skin is shiny. Encouraged pt to keep legs elevated as much as possible to help with edema.  RN discussed hospice care in length this am with pt and spouse. Pt states, " the doctor said she could get me thru the holidays." When asked about goals, pt states, " What if I dont want to die in my home?" Spouse stated, " I want you here until the last possible second." RN suggested that pt and spouse talk to each other and their family.   VSS.   RN to call pt Friday at 10 am to check in.   Jacqulyn Cane, RN

## 2022-11-18 NOTE — Telephone Encounter (Signed)
I refilled it

## 2022-11-18 NOTE — Telephone Encounter (Signed)
Called and told her prescription sent to preferred pharmacy. She verbalized understanding. She will call the office back for questions/ concerns.

## 2022-11-19 ENCOUNTER — Ambulatory Visit (HOSPITAL_COMMUNITY)
Admission: RE | Admit: 2022-11-19 | Discharge: 2022-11-19 | Disposition: A | Discharge status: Home / Self Care | Payer: PPO | Source: Ambulatory Visit | Attending: Hematology and Oncology | Admitting: Hematology and Oncology

## 2022-11-19 DIAGNOSIS — R188 Other ascites: Secondary | ICD-10-CM | POA: Diagnosis not present

## 2022-11-19 MED ORDER — LIDOCAINE HCL 1 % IJ SOLN
INTRAMUSCULAR | Status: AC
  Filled 2022-11-19: qty 20

## 2022-11-19 NOTE — Procedures (Signed)
PROCEDURE SUMMARY:  Successful image-guided paracentesis from the left lower abdomen.  Yielded 2.2 liters of cream colored fluid.  No immediate complications.  EBL = trace. Patient tolerated well.   Specimen was not sent for labs.  Please see imaging section of Epic for full dictation.   Armando Gang Maryclaire Stoecker PA-C 11/19/2022 10:15 AM

## 2022-11-20 ENCOUNTER — Encounter: Payer: Self-pay | Admitting: Nurse Practitioner

## 2022-11-20 ENCOUNTER — Ambulatory Visit: Payer: PPO

## 2022-11-20 ENCOUNTER — Other Ambulatory Visit: Payer: Self-pay | Admitting: Nurse Practitioner

## 2022-11-20 ENCOUNTER — Other Ambulatory Visit: Payer: PPO

## 2022-11-20 ENCOUNTER — Ambulatory Visit: Payer: PPO | Admitting: Hematology and Oncology

## 2022-11-20 ENCOUNTER — Ambulatory Visit (INDEPENDENT_AMBULATORY_CARE_PROVIDER_SITE_OTHER): Payer: PPO | Admitting: Nurse Practitioner

## 2022-11-20 VITALS — BP 120/62 | HR 83 | Temp 97.0°F | Ht 64.0 in | Wt 158.6 lb

## 2022-11-20 DIAGNOSIS — A419 Sepsis, unspecified organism: Secondary | ICD-10-CM | POA: Diagnosis not present

## 2022-11-20 DIAGNOSIS — Z79899 Other long term (current) drug therapy: Secondary | ICD-10-CM | POA: Diagnosis not present

## 2022-11-20 DIAGNOSIS — Z09 Encounter for follow-up examination after completed treatment for conditions other than malignant neoplasm: Secondary | ICD-10-CM | POA: Diagnosis not present

## 2022-11-20 DIAGNOSIS — E1122 Type 2 diabetes mellitus with diabetic chronic kidney disease: Secondary | ICD-10-CM | POA: Diagnosis not present

## 2022-11-20 DIAGNOSIS — Z96649 Presence of unspecified artificial hip joint: Secondary | ICD-10-CM

## 2022-11-20 DIAGNOSIS — I10 Essential (primary) hypertension: Secondary | ICD-10-CM

## 2022-11-20 DIAGNOSIS — N1832 Chronic kidney disease, stage 3b: Secondary | ICD-10-CM

## 2022-11-20 DIAGNOSIS — R188 Other ascites: Secondary | ICD-10-CM | POA: Diagnosis not present

## 2022-11-20 DIAGNOSIS — T8459XS Infection and inflammatory reaction due to other internal joint prosthesis, sequela: Secondary | ICD-10-CM

## 2022-11-20 DIAGNOSIS — R6520 Severe sepsis without septic shock: Secondary | ICD-10-CM | POA: Diagnosis not present

## 2022-11-20 DIAGNOSIS — R790 Abnormal level of blood mineral: Secondary | ICD-10-CM | POA: Diagnosis not present

## 2022-11-20 DIAGNOSIS — Z515 Encounter for palliative care: Secondary | ICD-10-CM

## 2022-11-20 MED ORDER — HYDROCHLOROTHIAZIDE 12.5 MG PO CAPS: 12.5000 mg | ORAL_CAPSULE | Freq: Every day | ORAL | 0 refills | Status: DC

## 2022-11-20 NOTE — Progress Notes (Signed)
Hospital follow up  Assessment and Plan: Hospital visit follow up for:    Hospital discharge follow-up Reviewed discharge instructions in full including medication changes, diagnostics, labs, and future follow ups appointment. All questions and concerns addressed.   Essential hypertension Discussed DASH (Dietary Approaches to Stop Hypertension) DASH diet is lower in sodium than a typical American diet. Cut back on foods that are high in saturated fat, cholesterol, and trans fats. Eat more whole-grain foods, fish, poultry, and nuts Remain active and exercise as tolerated daily.  Monitor BP at home-Call if greater than 130/80.  Check CMP/CBC  - CBC with Differential/Platelet - COMPLETE METABOLIC PANEL WITH GFR  Stage 3b chronic kidney disease (CKD) ([place]) Discussed how what you eat and drink can aide in kidney protection. Stay well hydrated. Avoid high salt foods. Avoid NSAIDS. Keep BP and BG well controlled.   Take medications as prescribed. Remain active and exercise as tolerated daily. Maintain weight.  Continue to monitor. Check CMP/GFR/Microablumin  Type 2 diabetes mellitus with stage 3b chronic kidney disease, without long-term current use of insulin [facility]) Education: Reviewed 'ABCs' of diabetes management  Discussed goals to be met and/or maintained include A1C (<7) Blood pressure (<130/80) Cholesterol (LDL <70) Continue Eye Exam yearly  Continue Dental Exam Q6 mo Discussed dietary recommendations Discussed Physical Activity recommendations Check A1C  Medication management All medications discussed and reviewed in full. All questions and concerns regarding medications addressed.    - CBC with Differential/Platelet - COMPLETE METABOLIC PANEL WITH GFR - Magnesium  Other ascites Continue to monitor  Low magnesium level  - Magnesium  Infection of prosthetic hip joint, sequela Continue abx tmt Monitor CBC  Sepsis with acute organ dysfunction, due to  unspecified organism, unspecified organ dysfunction type, unspecified whether septic shock present [facility]) Monitor CBC  Palliative care encounter Refused Continue second opinion.    All medications were reviewed with patient and family and fully reconciled. All questions answered fully, and patient and family members were encouraged to call the office with any further questions or concerns. Discussed goal to avoid readmission related to this diagnosis.   There are no discontinued medications.  Over 40 minutes of exam, counseling, chart review, and complex, high/moderate level critical decision making was performed this visit.   Future Appointments  Date Time Provider [place]  11/20/2022  3:00 PM Darrol Jump, NP GAAM-GAAIM None  11/21/2022 10:00 AM Jacqulyn Cane, RN ACP-ACP None  11/26/2022 11:00 AM Jacqulyn Cane, RN ACP-ACP None  01/29/2023 10:30 AM Tommy Medal, Lavell Islam, MD RCID-RCID RCID  02/25/2023 10:00 AM Darrol Jump, NP GAAM-GAAIM None  05/29/2023 11:00 AM Darrol Jump, NP GAAM-GAAIM None     HPI 86 y.o.female presents alongside husband for follow up for transition from recent hospitalization. Admit date to the hospital was 11/06/22, patient was discharged from the hospital on 11/10/22 and our clinical staff contacted the office the day after discharge to set up a follow up appointment. The discharge summary, medications, and diagnostic test results were reviewed before meeting with the patient. The patient was admitted for for confusion and abdominal distention.  CT head was negative, CT abdomen pelvis showed moderate ascites with inflammatory stranding around left colon. Patient was found to be neutropenic started on broad-spectrum antibiotics. Case was discussed by previous provider with oncology.   After patient had discussion with oncology was determined to follow-up outpatient likely with palliative care services.  However, she does not feel as though  she needs palliative care and has confirmed second  opinion follow up with a cancer center in [place] for next week.    Sepsis, presumed due to presence of severe neutropenia, altered mental status.  Multiple possible source of infection.   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 All cultures remained negative and thought to be possible colitis.  She was to continue home Duricef that she takes twice daily, additionally added Flagyl for 5 days twice daily which she has completed.  Metabolic encephalopathy, infective metabolic encephalopathy had resolved.  Ovarian cancer on Taxol, severe pancytopenia, Oncology continues following.  There is recommended that she would be a good candidate for hospice but patient continues not to be ready for this.   Home health is not involved. She does not feel as though she needs these services.   She is continuing to have RLE weeping.  She had a paracentesis on 11/19/22.  Images while in the hospital: US Paracentesis  Result Date: 11/19/2022 INDICATION: Patient with history of ovarian cancer, previous CT showed ascites. Request for therapeutic paracentesis. EXAM: ULTRASOUND GUIDED PARACENTESIS MEDICATIONS: 5 mL 1% lidocaine COMPLICATIONS: None immediate. PROCEDURE: Informed written consent was obtained from the patient after a discussion of the risks, benefits and alternatives to treatment. A timeout was performed prior to the initiation of the procedure. Initial ultrasound scanning demonstrates a moderate amount of ascites within the left lower abdominal quadrant. The left lower abdomen was prepped and draped in the usual sterile fashion. 1% lidocaine was used for local anesthesia. Following this, a 19 gauge, 7-cm, Yueh catheter was introduced. An ultrasound image was saved for documentation purposes. The paracentesis  was performed. The catheter was removed and a dressing was applied. The patient tolerated the procedure well without immediate post procedural complication. FINDINGS: A total of approximately 2.2 L of cream colored fluid was removed. IMPRESSION: Successful ultrasound-guided paracentesis yielding 2.2 liters of peritoneal fluid. Read by: Durenda Guthrie, PA-C Electronically Signed   By: Suzy Bouchard M.D.   On: 11/19/2022 15:03     Current Outpatient Medications (Endocrine & Metabolic):    glimepiride (AMARYL) 1 MG tablet, Take 1 tab by mouth in the morning if fasting is running 150+. Please do not take this medication if you are ill or not eating as this can cause a low blood sugar. (Patient not taking: Reported on 11/06/2022)  Current Outpatient Medications (Cardiovascular):    ezetimibe (ZETIA) 10 MG tablet, TAKE 1 TABLET BY MOUTH DAILY FOR CHOLESTEROL (Patient taking differently: Take 10 mg by mouth daily.)   metoprolol succinate (TOPROL-XL) 25 MG 24 hr tablet, Take  1 tablet  Daily  for BP                                                          /                                       TAKE                            BY  MOUTH (Patient taking differently: Take 25 mg by mouth in the morning.)   rosuvastatin (CRESTOR) 5 MG tablet, TAKE 1 TABLET BY MOUTH IN THE EVENING THREE TIMES WEEKLY FOR CHOLESTEROL (Patient taking differently: Take 5 mg by mouth every Monday, Wednesday, and Friday.)   Current Outpatient Medications (Analgesics):    acetaminophen (TYLENOL) 500 MG tablet, Take 1,000 mg by mouth every 6 (six) hours as needed for mild pain, headache or fever.   allopurinol (ZYLOPRIM) 100 MG tablet, Take 1 tablet (100 mg total) by mouth daily. FOR GOUT PREVENTION, (Patient taking differently: Take 100 mg by mouth in the morning.)   aspirin EC 81 MG tablet, Take 1 tablet (81 mg total) by mouth 2 (two) times daily. Swallow whole.   oxyCODONE (OXY IR/ROXICODONE) 5 MG immediate  release tablet, Take 1 tablet (5 mg total) by mouth every 6 (six) hours as needed for severe pain or moderate pain.  Current Outpatient Medications (Hematological):    cyanocobalamin 1000 MCG tablet, Take 1,000 mcg by mouth daily.  Current Outpatient Medications (Other):    cefadroxil (DURICEF) 500 MG capsule, Take 2 capsules (1,000 mg total) by mouth 2 (two) times daily.   Cholecalciferol (VITAMIN D3) 5000 units CAPS, Take 5,000 Units by mouth daily.   Ensure Max Protein (ENSURE MAX PROTEIN) LIQD, Take 330 mLs (11 oz total) by mouth 2 (two) times daily. (Patient not taking: Reported on 11/06/2022)   gabapentin (NEURONTIN) 300 MG capsule, Take 1 capsule 4 x  /day for Pain (Patient taking differently: Take 300 mg by mouth 4 (four) times daily.)   glucose blood test strip, Use as instructed   Lancets (FREESTYLE) lancets, USE TO CHECK BLOOD GLUCOSE ONCE DAILY AS DIRECTED   lidocaine-prilocaine (EMLA) cream, Apply 1 application. topically daily as needed (for port access).   magnesium oxide (MAG-OX) 400 (240 Mg) MG tablet, Take 1 tablet (400 mg total) by mouth daily.   ondansetron (ZOFRAN) 8 MG tablet, Take 1 tablet (8 mg total) by mouth every 8 (eight) hours as needed for refractory nausea / vomiting.   pantoprazole (PROTONIX) 40 MG tablet, TAKE 1 TABLET(40 MG) BY MOUTH DAILY (Patient taking differently: Take 40 mg by mouth daily before breakfast.)   polyethylene glycol (MIRALAX) 17 g packet, Take 17 g by mouth 2 (two) times daily. 17 grams in 6 oz of favorite drink twice a day until bowel movement.  LAXITIVE.  Restart if two days since last bowel movement (Patient taking differently: Take 17 g by mouth daily as needed for mild constipation.)   trolamine salicylate (ASPERCREME) 10 % cream, Apply 1 application  topically as needed (to both feet for neuropathic pain).   vitamin C (ASCORBIC ACID) 500 MG tablet, Take 500 mg by mouth every evening.   Past Medical History:  Diagnosis Date   Allergy     Anemia    Arthritis    Blood transfusion without reported diagnosis    Breast cancer of upper-inner quadrant of left female breast ([place]) 02/29/2016   skin- 2016- squamous- on right upper arm   Chronic kidney disease    Closed displaced fracture of right femoral neck (HCC) 11/07/2021   Deficiency anemia 11/02/2020   Diabetes mellitus without complication (HCC)    GERD (gastroesophageal reflux disease)    Gout    takes Allopurinol daily   History of chemotherapy    History of colon polyps    benign   History of shingles    Hx of radiation therapy    Hyperlipidemia  Hypertension    Ovarian ca ([place]) dx'd 03/2020   Personal history of chemotherapy    2017   Personal history of radiation therapy    2017   Vitamin D deficiency      Allergies  Allergen Reactions   Mobic [Meloxicam] Swelling   Prednisone Swelling and Other (See Comments)    Can Not take High doses   Premarin [Estrogens Conjugated] Hives   Trovan [Alatrofloxacin] Other (See Comments)    "I don't tolerate this well"    ROS: all negative except above.   Physical Exam: There were no vitals filed for this visit. There were no vitals taken for this visit. General Appearance: Well nourished, in no apparent distress. Eyes: PERRLA, EOMs, conjunctiva no swelling or erythema Sinuses: No Frontal/maxillary tenderness ENT/Mouth: Ext aud canals clear, TMs without erythema, bulging. No erythema, swelling, or exudate on post pharynx.  Tonsils not swollen or erythematous. Hearing normal.  Neck: Supple, thyroid normal.  Respiratory: Respiratory effort normal, BS equal bilaterally without rales, rhonchi, wheezing or stridor.  Cardio: RRR with no MRGs. Brisk peripheral pulses with +2 edema, very  mild weeping.  Abdomen: Soft, + BS.  Non tender, no guarding, rebound, hernias, masses. Lymphatics: Non tender without lymphadenopathy.  Musculoskeletal: Full ROM, 5/5 strength, normal gait.  Skin: Warm, dry without rashes, lesions,  ecchymosis.  Neuro: Cranial nerves intact. Normal muscle tone, no cerebellar symptoms. Sensation intact.  Psych: Awake and oriented X 3, normal affect, Insight and Judgment appropriate.     Darrol Jump, NP 2:16 PM [facility]

## 2022-11-21 ENCOUNTER — Telehealth: Payer: Self-pay

## 2022-11-21 ENCOUNTER — Other Ambulatory Visit: Payer: PPO

## 2022-11-21 LAB — MAGNESIUM: Magnesium: 1.3 mg/dL — ABNORMAL LOW (ref 1.5–2.5)

## 2022-11-21 LAB — CBC WITH DIFFERENTIAL/PLATELET
Absolute Monocytes: 580 cells/uL (ref 200–950)
Basophils Absolute: 43 cells/uL (ref 0–200)
Basophils Relative: 0.7 %
Eosinophils Absolute: 0 cells/uL — ABNORMAL LOW (ref 15–500)
Eosinophils Relative: 0 %
HCT: 29.7 % — ABNORMAL LOW (ref 35.0–45.0)
Hemoglobin: 9.7 g/dL — ABNORMAL LOW (ref 11.7–15.5)
Lymphs Abs: 1202 cells/uL (ref 850–3900)
MCH: 32.2 pg (ref 27.0–33.0)
MCHC: 32.7 g/dL (ref 32.0–36.0)
MCV: 98.7 fL (ref 80.0–100.0)
MPV: 10.7 fL (ref 7.5–12.5)
Monocytes Relative: 9.5 %
Neutro Abs: 4276 cells/uL (ref 1500–7800)
Neutrophils Relative %: 70.1 %
Platelets: 132 10*3/uL — ABNORMAL LOW (ref 140–400)
RBC: 3.01 10*6/uL — ABNORMAL LOW (ref 3.80–5.10)
RDW: 17.7 % — ABNORMAL HIGH (ref 11.0–15.0)
Total Lymphocyte: 19.7 %
WBC: 6.1 10*3/uL (ref 3.8–10.8)

## 2022-11-21 LAB — COMPLETE METABOLIC PANEL WITH GFR
AG Ratio: 1 (calc) (ref 1.0–2.5)
ALT: 18 U/L (ref 6–29)
AST: 49 U/L — ABNORMAL HIGH (ref 10–35)
Albumin: 2.6 g/dL — ABNORMAL LOW (ref 3.6–5.1)
Alkaline phosphatase (APISO): 112 U/L (ref 37–153)
BUN: 16 mg/dL (ref 7–25)
CO2: 26 mmol/L (ref 20–32)
Calcium: 8.8 mg/dL (ref 8.6–10.4)
Chloride: 113 mmol/L — ABNORMAL HIGH (ref 98–110)
Creat: 0.94 mg/dL (ref 0.60–0.95)
Globulin: 2.7 g/dL (calc) (ref 1.9–3.7)
Glucose, Bld: 137 mg/dL — ABNORMAL HIGH (ref 65–99)
Potassium: 4.6 mmol/L (ref 3.5–5.3)
Sodium: 145 mmol/L (ref 135–146)
Total Bilirubin: 0.6 mg/dL (ref 0.2–1.2)
Total Protein: 5.3 g/dL — ABNORMAL LOW (ref 6.1–8.1)
eGFR: 60 mL/min/{1.73_m2} (ref >=60)

## 2022-11-21 NOTE — Telephone Encounter (Signed)
2.2L Diuretics low dose hctz

## 2022-11-22 ENCOUNTER — Ambulatory Visit: Payer: PPO

## 2022-11-26 ENCOUNTER — Other Ambulatory Visit: Payer: PPO

## 2022-11-26 NOTE — Patient Instructions (Signed)

## 2022-11-27 DIAGNOSIS — C563 Malignant neoplasm of bilateral ovaries: Secondary | ICD-10-CM | POA: Diagnosis not present

## 2022-11-29 ENCOUNTER — Emergency Department (HOSPITAL_BASED_OUTPATIENT_CLINIC_OR_DEPARTMENT_OTHER)
Admission: EM | Admit: 2022-11-29 | Discharge: 2022-11-29 | Disposition: A | Discharge status: Home / Self Care | Payer: PPO | Attending: Emergency Medicine | Admitting: Emergency Medicine

## 2022-11-29 ENCOUNTER — Other Ambulatory Visit: Payer: Self-pay

## 2022-11-29 ENCOUNTER — Emergency Department (HOSPITAL_BASED_OUTPATIENT_CLINIC_OR_DEPARTMENT_OTHER): Payer: PPO

## 2022-11-29 DIAGNOSIS — Z8543 Personal history of malignant neoplasm of ovary: Secondary | ICD-10-CM | POA: Insufficient documentation

## 2022-11-29 DIAGNOSIS — W208XXA Other cause of strike by thrown, projected or falling object, initial encounter: Secondary | ICD-10-CM | POA: Diagnosis not present

## 2022-11-29 DIAGNOSIS — S91312A Laceration without foreign body, left foot, initial encounter: Secondary | ICD-10-CM | POA: Diagnosis not present

## 2022-11-29 DIAGNOSIS — S91312S Laceration without foreign body, left foot, sequela: Secondary | ICD-10-CM

## 2022-11-29 DIAGNOSIS — N189 Chronic kidney disease, unspecified: Secondary | ICD-10-CM | POA: Diagnosis not present

## 2022-11-29 DIAGNOSIS — I129 Hypertensive chronic kidney disease with stage 1 through stage 4 chronic kidney disease, or unspecified chronic kidney disease: Secondary | ICD-10-CM | POA: Diagnosis not present

## 2022-11-29 DIAGNOSIS — Z23 Encounter for immunization: Secondary | ICD-10-CM | POA: Insufficient documentation

## 2022-11-29 DIAGNOSIS — Z7982 Long term (current) use of aspirin: Secondary | ICD-10-CM | POA: Insufficient documentation

## 2022-11-29 DIAGNOSIS — M7989 Other specified soft tissue disorders: Secondary | ICD-10-CM | POA: Diagnosis not present

## 2022-11-29 MED ORDER — TETANUS-DIPHTH-ACELL PERTUSSIS 5-2.5-18.5 LF-MCG/0.5 IM SUSY
0.5000 mL | PREFILLED_SYRINGE | Freq: Once | INTRAMUSCULAR | Status: AC
  Administered 2022-11-29: 0.5 mL via INTRAMUSCULAR
  Filled 2022-11-29: qty 0.5

## 2022-11-29 MED ORDER — LIDOCAINE-EPINEPHRINE (PF) 2 %-1:200000 IJ SOLN
10.0000 mL | Freq: Once | INTRAMUSCULAR | Status: AC
  Administered 2022-11-29: 10 mL
  Filled 2022-11-29: qty 20

## 2022-11-29 NOTE — Discharge Instructions (Signed)
You were seen in the emergency department for your left foot laceration.   We have closed your laceration(s) with sutures. These need to be removed in 10 days. This can be done at any doctor's office, urgent care, or emergency department.   If any of the sutures come out before it is time for removal, that is okay. Make sure to keep the area as clean and dry as possible. You can let warm soapy warm run over the area, but do NOT scrub it.   Watch out for signs of infection, like we discussed, including: increased redness, tenderness, or drainage of pus from the area. If this happens and you have not been prescribed an antibiotic, please seek medical attention for possible infection.   You can take over the counter pain medicine like ibuprofen or tylenol as needed.  Please continue to take your previously prescribed antibiotic.

## 2022-11-29 NOTE — ED Triage Notes (Signed)
Patient arrives with complaints of sustaining a left foot injury due to dropping a wooden board on her foot. Patient noted that she sustained a laceration on the top of her foot as well. Unsure of last tetanus shot.  Rates pain a 5/10.

## 2022-11-29 NOTE — ED Provider Notes (Signed)
[facility] EMERGENCY DEPT Provider Note   CSN: [id number] Arrival date & time: 11/29/22  1702     History  Chief Complaint  Patient presents with   Foot Injury    Left    Laceration    Kayla Baker is a 86 y.o. female.  Patient with history of hypertension, CKD, ovarian cancer presents today with complaints of left foot laceration.  She states that immediately prior to arrival today she was moving things around to clean in the living room and accidentally dropped a plaque on her left foot.  Endorses bleeding to same as well as pain.  She has been ambulatory since without difficulty.  She is not on anticoagulation.  She is unsure of last tetanus.  No other injuries or complaints.  Of note, patient did have a hip replacement in December 2022 that was found to be infected in September 2023.  At that time she had a PICC line placed and had 6 weeks of antibiotics and is on 1 year of daily Duricef. She has also been on chemotherapy for ovarian cancer, her last infusion of this was at the beginning of December. She denies fevers, chills, nausea, vomiting.  The history is provided by the patient. No language interpreter was used.  Foot Injury Laceration      Home Medications Prior to Admission medications   Medication Sig Start Date End Date Taking? Authorizing Provider  acetaminophen (TYLENOL) 500 MG tablet Take 1,000 mg by mouth every 6 (six) hours as needed for mild pain, headache or fever.    [provider]  allopurinol (ZYLOPRIM) 100 MG tablet Take 1 tablet (100 mg total) by mouth daily. FOR GOUT PREVENTION, Patient taking differently: Take 100 mg by mouth in the morning. 03/25/22   Liane Comber, NP  aspirin EC 81 MG tablet Take 1 tablet (81 mg total) by mouth 2 (two) times daily. Swallow whole. 09/02/22   Eugenie Filler, MD  cefadroxil (DURICEF) 500 MG capsule Take 2 capsules (1,000 mg total) by mouth 2 (two) times daily. 09/29/22   Truman Hayward, MD  Cholecalciferol (VITAMIN D3) 5000 units CAPS Take 5,000 Units by mouth daily.    [provider]  cyanocobalamin 1000 MCG tablet Take 1,000 mcg by mouth daily.    [provider]  Ensure Max Protein (ENSURE MAX PROTEIN) LIQD Take 330 mLs (11 oz total) by mouth 2 (two) times daily. 09/02/22   Eugenie Filler, MD  ezetimibe (ZETIA) 10 MG tablet TAKE 1 TABLET BY MOUTH DAILY FOR CHOLESTEROL Patient taking differently: Take 10 mg by mouth daily. 04/24/22   Liane Comber, NP  gabapentin (NEURONTIN) 300 MG capsule Take 1 capsule 4 x  /day for Pain Patient taking differently: Take 300 mg by mouth 4 (four) times daily. 12/07/21   [name], MD  glimepiride (AMARYL) 1 MG tablet Take 1 tab by mouth in the morning if fasting is running 150+. Please do not take this medication if you are ill or not eating as this can cause a low blood sugar. 05/04/20   Liane Comber, NP  glucose blood test strip Use as instructed 11/06/22   Darrol Jump, NP  hydrochlorothiazide (MICROZIDE) 12.5 MG capsule Take 1 capsule (12.5 mg total) by mouth daily. 11/20/22 12/20/22  Darrol Jump, NP  Lancets (FREESTYLE) lancets USE TO CHECK BLOOD GLUCOSE ONCE DAILY AS DIRECTED 11/06/22   Darrol Jump, NP  lidocaine-prilocaine (EMLA) cream Apply 1 application. topically daily as needed (for port  access). 03/07/22   Heath Lark, MD  magnesium oxide (MAG-OX) 400 (240 Mg) MG tablet Take 1 tablet (400 mg total) by mouth daily. 08/19/22   Heath Lark, MD  metoprolol succinate (TOPROL-XL) 25 MG 24 hr tablet Take  1 tablet  Daily  for BP                                                          /                                       TAKE                            BY                                 MOUTH Patient taking differently: Take 25 mg by mouth in the morning. 09/21/22   [name], MD  ondansetron (ZOFRAN) 8 MG tablet Take 1 tablet (8 mg total) by mouth every 8 (eight) hours as needed for refractory  nausea / vomiting. 06/13/22   Heath Lark, MD  oxyCODONE (OXY IR/ROXICODONE) 5 MG immediate release tablet Take 1 tablet (5 mg total) by mouth every 6 (six) hours as needed for severe pain or moderate pain. 11/18/22   Heath Lark, MD  pantoprazole (PROTONIX) 40 MG tablet TAKE 1 TABLET(40 MG) BY MOUTH DAILY Patient taking differently: Take 40 mg by mouth daily before breakfast. 10/31/22   Heath Lark, MD  polyethylene glycol (MIRALAX) 17 g packet Take 17 g by mouth 2 (two) times daily. 17 grams in 6 oz of favorite drink twice a day until bowel movement.  LAXITIVE.  Restart if two days since last bowel movement Patient taking differently: Take 17 g by mouth daily as needed for mild constipation. 11/10/21   Shepperson, Kirstin, PA-C  rosuvastatin (CRESTOR) 5 MG tablet TAKE 1 TABLET BY MOUTH IN THE EVENING THREE TIMES WEEKLY FOR CHOLESTEROL Patient taking differently: Take 5 mg by mouth every Monday, Wednesday, and Friday. 03/07/22   Liane Comber, NP  trolamine salicylate (ASPERCREME) 10 % cream Apply 1 application  topically as needed (to both feet for neuropathic pain).    [provider]  vitamin C (ASCORBIC ACID) 500 MG tablet Take 500 mg by mouth every evening.     [provider]      Allergies    Mobic [meloxicam], Prednisone, Premarin [estrogens conjugated], and Trovan [alatrofloxacin]    Review of Systems   Review of Systems  Skin:  Positive for wound.  All other systems reviewed and are negative.   Physical Exam Updated Vital Signs BP (!) 144/75   Pulse 75   Temp 97.7 F (36.5 C) (Oral)   Resp 16   Ht 5' 4"  (1.626 m)   Wt 71.9 kg   SpO2 94%   BMI 27.21 kg/m  Physical Exam Vitals and nursing note reviewed.  Constitutional:      General: She is not in acute distress.    Appearance: Normal appearance. She is normal weight. She is not ill-appearing, toxic-appearing or diaphoretic.  HENT:  Head: Normocephalic and atraumatic.  Cardiovascular:     Rate and  Rhythm: Normal rate.  Pulmonary:     Effort: Pulmonary effort is normal. No respiratory distress.  Musculoskeletal:        General: Normal range of motion.     Cervical back: Normal range of motion.  Skin:    General: Skin is warm and dry.     Comments: 5 cm "L" shaped laceration noted to the dorsal aspect of the left foot.  No active bleeding or signs of foreign body.  Compartments soft, distal sensation intact.  Capillary refill less than 2 seconds.  Neurological:     General: No focal deficit present.     Mental Status: She is alert.  Psychiatric:        Mood and Affect: Mood normal.        Behavior: Behavior normal.     ED Results / Procedures / Treatments   Labs (all labs ordered are listed, but only abnormal results are displayed) Labs Reviewed - No data to display  EKG None  Radiology DG Foot Complete Left  Result Date: 11/29/2022 CLINICAL DATA:  Trauma EXAM: LEFT FOOT - COMPLETE 3+ VIEW COMPARISON:  02/10/2011 FINDINGS: No displaced fracture or dislocation is seen. There is soft tissue swelling around the ankle and dorsal aspect of foot. No radiopaque foreign bodies are seen. IMPRESSION: No displaced fracture or dislocation is seen in left foot. Electronically Signed   By: Elmer Picker M.D.   On: 11/29/2022 21:38    Procedures .Marland KitchenLaceration Repair  Date/Time: 11/29/2022 10:25 PM  Performed by: Bud Face, PA-C Authorized by: Bud Face, PA-C   Consent:    Consent obtained:  Verbal   Consent given by:  Patient   Risks, benefits, and alternatives were discussed: yes     Risks discussed:  Infection, pain, retained foreign body, tendon damage, vascular damage, poor wound healing, poor cosmetic result, need for additional repair and nerve damage   Alternatives discussed:  No treatment, delayed treatment, observation and referral Universal protocol:    Procedure explained and questions answered to patient or proxy's satisfaction: yes     Imaging studies  available: yes     Patient identity confirmed:  Verbally with patient Anesthesia:    Anesthesia method:  Local infiltration   Local anesthetic:  Lidocaine 2% WITH epi Laceration details:    Location:  Foot   Foot location:  Top of L foot   Length (cm):  5   Depth (mm):  3 Exploration:    Imaging obtained: x-ray     Imaging outcome: foreign body not noted     Wound exploration: wound explored through full range of motion and entire depth of wound visualized   Treatment:    Area cleansed with:  Saline   Amount of cleaning:  Standard   Irrigation solution:  Sterile saline   Irrigation volume:  1 L   Irrigation method:  Pressure wash Skin repair:    Repair method:  Sutures   Suture size:  4-0   Suture material:  Prolene   Suture technique:  Simple interrupted   Number of sutures:  8 Approximation:    Approximation:  Close Repair type:    Repair type:  Simple Post-procedure details:    Dressing:  Antibiotic ointment and non-adherent dressing   Procedure completion:  Tolerated well, no immediate complications     Medications Ordered in ED Medications  lidocaine-EPINEPHrine (XYLOCAINE W/EPI) 2 %-1:200000 (PF) injection 10  mL (10 mLs Infiltration Given 11/29/22 2202)  Tdap (BOOSTRIX) injection 0.5 mL (0.5 mLs Intramuscular Given 11/29/22 2126)    ED Course/ Medical Decision Making/ A&P                           Medical Decision Making Amount and/or Complexity of Data Reviewed Radiology: ordered.  Risk Prescription drug management.   Patient presents today with complaints of left foot laceration that occurred immediately prior to arrival today. She is afebrile, nontoxic appearing, and in no acute distress with reassuring vital signs. Pressure irrigation performed of wound. Wound explored and base of wound visualized in a bloodless field without evidence of foreign body.  Laceration occurred < 8 hours prior to repair which was well tolerated.  Tdap updated. Did consider  giving patient antibiotics given her hip prosthesis infection that she had recently and her status as an active cancer patient with last chemo at the beginning of December. However, patient is on daily Duricef for 1 year which should cover her for prophylaxis of this wound as well. Chart reviewed, patients last CBC on 12/21 did not show neutropenia. Patient to be discharged without additional antibiotics. Discussed suture home care with patient and answered questions. Pt to follow-up for wound check and suture removal in 10 days; they are to return to the ED sooner for signs of infection. Pt is hemodynamically stable with no complaints prior to dc. Patient understanding and amenable with plan, discharged in stable condition.   Final Clinical Impression(s) / ED Diagnoses Final diagnoses:  Foot laceration, left, sequela    Rx / DC Orders ED Discharge Orders     None     An After Visit Summary was printed and given to the patient.     Nestor Lewandowsky 11/30/22 1543    Charlesetta Shanks, MD 12/10/22 [phone]

## 2022-12-02 ENCOUNTER — Telehealth: Payer: Self-pay

## 2022-12-02 ENCOUNTER — Other Ambulatory Visit: Payer: Self-pay | Admitting: Hematology and Oncology

## 2022-12-02 NOTE — Telephone Encounter (Signed)
LM-12/02/22-Spoke to pt. And completed ED discharge protocol. Pt. Was seen on 12/30 in the ED for a left foot laceration and had to get stitches. Pt. States her wound has no s/s of infection and is healing fine.Pt. has no questions/concerns. Pt. was advised to f/u w/ PCP for suture removal, but pt. does not want to schedule a f/u visit at this time. Pt. states her niece is a PA and agreed to take the sutures out for pt. Advised pt. to call if she needs any further assistance or would like to schedule PCP f/u. (16 min.)

## 2022-12-04 ENCOUNTER — Telehealth: Payer: Self-pay

## 2022-12-04 NOTE — Telephone Encounter (Signed)
Called to follow up to see if palliative care is still seeing her. They are scheduled to see palliative care on 1/9. She went to [facility] for a second opinion and is asking if Dr. [name] would be willing to give treatment. Told her per Dr. [name], that she is not willing to prescribing anymore chemo. If she wants more chemo, then she should get treatment at [facility] or at another facility. She verbalized understanding and will discuss with her family and then call the office back.

## 2022-12-05 ENCOUNTER — Telehealth: Payer: Self-pay

## 2022-12-05 NOTE — Telephone Encounter (Signed)
Returned her call. She and her family has decided to not get treatment at [facility]. Would like to stay in the [place] area and just not understanding what will happen next. Offered appt and scheduled appt 1/8 at 11 am, instructed to arrive early and bring husband/ daughter.

## 2022-12-08 ENCOUNTER — Encounter: Payer: Self-pay | Admitting: Hematology and Oncology

## 2022-12-08 ENCOUNTER — Inpatient Hospital Stay: Payer: PPO | Attending: Gynecologic Oncology | Admitting: Hematology and Oncology

## 2022-12-08 VITALS — BP 142/63 | HR 88 | Temp 97.7°F | Resp 18 | Ht 64.0 in | Wt 154.0 lb

## 2022-12-08 DIAGNOSIS — R188 Other ascites: Secondary | ICD-10-CM | POA: Diagnosis not present

## 2022-12-08 DIAGNOSIS — C569 Malignant neoplasm of unspecified ovary: Secondary | ICD-10-CM | POA: Diagnosis not present

## 2022-12-08 DIAGNOSIS — C786 Secondary malignant neoplasm of retroperitoneum and peritoneum: Secondary | ICD-10-CM | POA: Insufficient documentation

## 2022-12-08 DIAGNOSIS — C561 Malignant neoplasm of right ovary: Secondary | ICD-10-CM | POA: Diagnosis not present

## 2022-12-08 DIAGNOSIS — Z7189 Other specified counseling: Secondary | ICD-10-CM

## 2022-12-08 DIAGNOSIS — R600 Localized edema: Secondary | ICD-10-CM | POA: Diagnosis not present

## 2022-12-08 DIAGNOSIS — Z79899 Other long term (current) drug therapy: Secondary | ICD-10-CM | POA: Insufficient documentation

## 2022-12-08 NOTE — Assessment & Plan Note (Signed)
Her leg swelling is related to her abdominal disease process and third spacing due to her poor nutritional status I told the patient I would not recommend diuretic therapy due to risk of kidney injury

## 2022-12-08 NOTE — Progress Notes (Signed)
[place] OFFICE PROGRESS NOTE  Patient Care Team: [name], MD as PCP - Kinnie Scales, OD as Referring Physician (Optometry) [name], MD as Consulting Physician (Dermatology) Latanya Maudlin, MD as Consulting Physician (Orthopedic Surgery) Inda Castle, MD (Inactive) as Consulting Physician (Gastroenterology) Heath Lark, MD as Consulting Physician (Hematology and Oncology) Madelon Lips, MD as Consulting Physician (Nephrology) Carleene Mains, [facility] (Pharmacist)  ASSESSMENT & PLAN:  Ovarian cancer [facility]) She is intolerant to olaparib and Taxotere despite significant dose adjustment She has progressed on carboplatin, paclitaxel and gemcitabine  I have reviewed recommendation from [facility] cancer center We discussed again the rationale behind palliative chemotherapy I have discussed this with the family before although the patient did not remember much of our conversation while she was seen in the hospital I told the patient I am not willing to prescribe any more chemotherapy due to significant complications with recurrent hospitalization secondary to immunosuppression from chemotherapy and severe pancytopenia, this is despite significant dose reduction and G-CSF support At this point in time, due to her comorbidities, the risk of chemotherapy outweighs the benefit If the patient is not willing to discontinue chemotherapy and desired further palliative chemotherapy, I recommend the patient to pursue systemic treatment in another institution  Bilateral lower extremity edema Her leg swelling is related to her abdominal disease process and third spacing due to her poor nutritional status I told the patient I would not recommend diuretic therapy due to risk of kidney injury  Other ascites She had paracentesis performed last year We discussed risk and benefits of paracentesis She does not need repeat paracentesis today  Goals of care,  counseling/discussion We have numerous goals of care discussions in the past We discussed the role of palliative care with hospice services The patient is not ready to accept hospice services at this point but if she decide she is ready, we will contact palliative care service In the meantime, she will continue home-based palliative care only  No orders of the defined types were placed in this encounter.   All questions were answered. The patient knows to call the clinic with any problems, questions or concerns. The total time spent in the appointment was 40 minutes encounter with patients including review of chart and various tests results, discussions about plan of care and coordination of care plan   Heath Lark, MD 12/08/2022 12:09 PM  INTERVAL HISTORY: Please see below for problem oriented charting. she returns for further discussion about goals of care Since the patient left the hospital, she had recent emergency room visit due to injury to her foot She went to [facility] cancer center for second opinion and did not understand why she is not receiving treatment here She did not recall our conversation regarding palliative care and hospice and stopping chemotherapy in the hospital Her husband is present throughout the visit I spent majority of our time reviewing plan of care and discussing the rationale of not pursuing further systemic treatment  REVIEW OF SYSTEMS:   Constitutional: Denies fevers, chills or abnormal weight loss Eyes: Denies blurriness of vision Ears, nose, mouth, throat, and face: Denies mucositis or sore throat Respiratory: Denies cough, dyspnea or wheezes Cardiovascular: Denies palpitation, chest discomfort Gastrointestinal:  Denies nausea, heartburn or change in bowel habits Skin: Denies abnormal skin rashes Lymphatics: Denies new lymphadenopathy or easy bruising Neurological:Denies numbness, tingling or new weaknesses Behavioral/Psych: Mood is stable, no new  changes  All other systems were reviewed with the patient and are  negative.  I have reviewed the past medical history, past surgical history, social history and family history with the patient and they are unchanged from previous note.  ALLERGIES:  is allergic to mobic [meloxicam], prednisone, premarin [estrogens conjugated], and trovan [alatrofloxacin].  MEDICATIONS:  Current Outpatient Medications  Medication Sig Dispense Refill   acetaminophen (TYLENOL) 500 MG tablet Take 1,000 mg by mouth every 6 (six) hours as needed for mild pain, headache or fever.     allopurinol (ZYLOPRIM) 100 MG tablet Take 1 tablet (100 mg total) by mouth daily. FOR GOUT PREVENTION, (Patient taking differently: Take 100 mg by mouth in the morning.) 90 tablet 3   aspirin EC 81 MG tablet Take 1 tablet (81 mg total) by mouth 2 (two) times daily. Swallow whole. 30 tablet 12   cefadroxil (DURICEF) 500 MG capsule Take 2 capsules (1,000 mg total) by mouth 2 (two) times daily. 120 capsule 11   Cholecalciferol (VITAMIN D3) 5000 units CAPS Take 5,000 Units by mouth daily.     cyanocobalamin 1000 MCG tablet Take 1,000 mcg by mouth daily.     Ensure Max Protein (ENSURE MAX PROTEIN) LIQD Take 330 mLs (11 oz total) by mouth 2 (two) times daily.     ezetimibe (ZETIA) 10 MG tablet TAKE 1 TABLET BY MOUTH DAILY FOR CHOLESTEROL (Patient taking differently: Take 10 mg by mouth daily.) 90 tablet 3   gabapentin (NEURONTIN) 300 MG capsule Take 1 capsule 4 x  /day for Pain (Patient taking differently: Take 300 mg by mouth 4 (four) times daily.) 360 capsule 3   glimepiride (AMARYL) 1 MG tablet Take 1 tab by mouth in the morning if fasting is running 150+. Please do not take this medication if you are ill or not eating as this can cause a low blood sugar. 90 tablet 1   glucose blood test strip Use as instructed 100 each 12   hydrochlorothiazide (MICROZIDE) 12.5 MG capsule Take 1 capsule (12.5 mg total) by mouth daily. 30 capsule 0   Lancets  (FREESTYLE) lancets USE TO CHECK BLOOD GLUCOSE ONCE DAILY AS DIRECTED 100 each 3   lidocaine-prilocaine (EMLA) cream Apply 1 application. topically daily as needed (for port access). 30 g 3   magnesium oxide (MAG-OX) 400 (240 Mg) MG tablet TAKE 1 TABLET(400 MG) BY MOUTH DAILY 30 tablet 1   metoprolol succinate (TOPROL-XL) 25 MG 24 hr tablet Take  1 tablet  Daily  for BP                                                          /                                       TAKE                            BY                                 MOUTH (Patient taking differently: Take 25 mg by mouth in the morning.) 90 tablet 3   ondansetron (ZOFRAN) 8 MG tablet Take 1  tablet (8 mg total) by mouth every 8 (eight) hours as needed for refractory nausea / vomiting. 30 tablet 1   oxyCODONE (OXY IR/ROXICODONE) 5 MG immediate release tablet Take 1 tablet (5 mg total) by mouth every 6 (six) hours as needed for severe pain or moderate pain. 30 tablet 0   pantoprazole (PROTONIX) 40 MG tablet TAKE 1 TABLET(40 MG) BY MOUTH DAILY (Patient taking differently: Take 40 mg by mouth daily before breakfast.) 90 tablet 0   polyethylene glycol (MIRALAX) 17 g packet Take 17 g by mouth 2 (two) times daily. 17 grams in 6 oz of favorite drink twice a day until bowel movement.  LAXITIVE.  Restart if two days since last bowel movement (Patient taking differently: Take 17 g by mouth daily as needed for mild constipation.) 14 packet 0   rosuvastatin (CRESTOR) 5 MG tablet TAKE 1 TABLET BY MOUTH IN THE EVENING THREE TIMES WEEKLY FOR CHOLESTEROL (Patient taking differently: Take 5 mg by mouth every Monday, Wednesday, and Friday.) 36 tablet 3   trolamine salicylate (ASPERCREME) 10 % cream Apply 1 application  topically as needed (to both feet for neuropathic pain).     vitamin C (ASCORBIC ACID) 500 MG tablet Take 500 mg by mouth every evening.      No current facility-administered medications for this visit.    SUMMARY OF ONCOLOGIC  HISTORY: Oncology History Overview Note  HRD positive Progressed and intolerant to olaparib Progressed on carboplatin, taxol and gemzar   History of left breast cancer  02/26/2016 Initial Diagnosis   Left breast biopsy 11:00 position: invasive ductal carcinoma with DCIS, ER 90%, PR 10%, HER-2 negative, Ki-67 30%, grade 2, 2.2 cm palpable lesion T2 N0 stage II a clinical stage   03/18/2016 Surgery   Left lumpectomy: Invasive ductal carcinoma, grade 2, 6.3 cm, with high-grade DCIS, margins negative, 0/4 lymph nodes negative, ER 90%, via 10%, HER-2 negative ratio 0.97, Ki-67 30%, T3 N0 stage IIB   03/25/2016 Procedure   Genetic testing is negative for pathogenic mutations within any of the 20 Genes on the breast/ovarian cancer panel   04/04/2016 Oncotype testing   Oncotype DX recurrence score 37, 25% 10 year distant risk of recurrence   04/17/2016 - 07/31/2016 Chemotherapy   Adjuvant chemotherapy with dose dense Adriamycin and Cytoxan followed by Abraxane weekly 8 ( discontinued due to neuropathy)   09/01/2016 - 09/26/2016 Radiation Therapy   Adj XRT 1) Left breast: 42.5 Gy in 17 fractions. 2) Left breast boost: 7.5 Gy in 3 fractions.   12/02/2016 -  Anti-estrogen oral therapy   Anastrozole 1 mg daily   09/05/2021 - 05/21/2022 Chemotherapy   Patient is on Treatment Plan : OVARIAN RECURRENT 3RD LINE Carboplatin D1 / Gemcitabine D1,8 (4/800) q21d     07/08/2022 - 07/29/2022 Chemotherapy   Patient is on Treatment Plan : ovarian Docetaxel q21d     07/08/2022 - 11/03/2022 Chemotherapy   Patient is on Treatment Plan : BREAST Docetaxel (100) q21d     Ovarian cancer ([place])  04/03/2020 Imaging   US pelvis Complex cystic and solid mass in LEFT adnexa 12.5 cm diameter question cystic ovarian neoplasm; recommend correlation with serum tumor markers and further evaluation by MR imaging with and without contrast.     04/04/2020 Imaging   US venous Doppler No evidence of deep venous thrombosis in the right  lower extremity. Left common femoral vein also patent.   04/15/2020 Imaging   MRI pelvis 1. Large complex solid and cystic mass arising  from the right adnexa measuring 10.8 by 11.5 by 11.1 cm. This has an aggressive appearance with extensive enhancing mural soft tissue components. Findings are highly suspicious for malignant ovarian neoplasm. 2. Extensive bilateral retroperitoneal and bilateral iliac adenopathy compatible with metastatic disease. 3. Signs of extensive peritoneal carcinomatosis including ascites, enhancement, thickening and nodularity of the peritoneal reflections, omental caking and bulky peritoneal nodularity. 4. Suspected serosal involvement of the dome of bladder with loss of normal fat plane. Cannot rule out mural invasion by tumor.   04/17/2020 Tumor Marker   Patient's tumor was tested for the following markers: CA-125 Results of the tumor marker test revealed 1762.   04/20/2020 Cancer Staging   Staging form: Ovary, Fallopian Tube, and Primary Peritoneal Carcinoma, AJCC 8th Edition - Clinical: FIGO Stage IIIC (cT3, cN1, cM0) - Signed by Heath Lark, MD on 04/20/2020   04/23/2020 Imaging   1. Redemonstrated dominant mixed solid and cystic mass arising from the vicinity of the right ovary measuring at least 11.5 x 10.7 cm, not significantly changed compared to prior MR, and consistent with primary ovarian malignancy.  2. Numerous bulky retroperitoneal, bilateral iliac, and pelvic sidewall lymph nodes. 3. Moderate volume ascites throughout the abdomen and pelvis with subtle thickening and nodularity throughout the peritoneum, and extensive bulky nodular metastatic disease of the omentum. 4. Constellation of findings is consistent with advanced nodal and peritoneal metastatic disease. 5. There are prominent subcentimeter epicardial lymph nodes, nonspecific although suspicious for nodal metastatic disease. No definite nodal metastatic disease in the chest. Attention on  follow-up. 6. There are multiple small subpleural nodules at the right lung base overlying the diaphragm, measuring up to 7 mm. These are generally nonspecific and less favored to represent pulmonary metastatic disease given distribution. Attention on follow-up. 7. Somewhat coarse contour of the liver, suggestive of cirrhosis, although out without overt morphologic stigmata. 8. Aortic Atherosclerosis (ICD10-I70.0).     04/24/2020 Procedure   Successful placement of a right internal jugular approach power injectable Port-A-Cath. The catheter is ready for immediate use.     05/02/2020 Tumor Marker   Patient's tumor was tested for the following markers: CA-125 Results of the tumor marker test revealed 1921   05/03/2020 - 12/12/2020 Chemotherapy   The patient had carboplatin and taxol for chemotherapy treatment.     05/08/2020 Procedure   Successful ultrasound-guided paracentesis yielding 2.6 liters of peritoneal fluid.   05/16/2020 Procedure   Successful ultrasound-guided paracentesis yielding 3.2 liters of peritoneal fluid   05/25/2020 Procedure   Successful ultrasound-guided paracentesis yielding 3 liters of peritoneal fluid.     06/01/2020 Procedure   Successful ultrasound-guided therapeutic paracentesis yielding 1.7 liters of peritoneal fluid.   06/14/2020 Tumor Marker   Patient's tumor was tested for the following markers: CA-125 Results of the tumor marker test revealed 1309   06/20/2020 Imaging   1. Dominant mixed cystic and solid mass in the central pelvis is stable in the interval. 2. Clear interval decrease in retroperitoneal and pelvic sidewall lymphadenopathy. The bulky omental disease has also clearly decreased in the interval. 3. Interval decrease in ascites. 4. Stable 8 mm subpleural nodule along the diaphragm. 5. Aortic Atherosclerosis (ICD10-I70.0).   07/10/2020 Tumor Marker   Patient's tumor was tested for the following markers: CA-125 Results of the tumor marker test  revealed 220   08/03/2020 Tumor Marker   Patient's tumor was tested for the following markers: CA-125 Results of the tumor marker test revealed 53.3.   09/03/2020 Tumor Marker   Patient's tumor  was tested for the following markers: CA-125 Results of the tumor marker test revealed 29.7   09/20/2020 Imaging   1. Substantial improvement. The right ovarian cystic and solid mass is half the volume that it measured on 06/20/2020. Similar reduction in the bulk of the omental caking of tumor. Prior ascites is resolved and the retroperitoneal adenopathy is markedly improved. 2. Other imaging findings of potential clinical significance: Stable pleural-based nodularity along the right hemidiaphragm measuring about 1.1 by 0.7 by 0.3 cm. Stable hypodense lesion of the right kidney upper pole, probably a cyst although the configuration of this lesion makes it difficult to obtain accurate density measurements. Left ovarian cyst, stable. Chondrocalcinosis involving the acetabular labra. Mild lumbar spondylosis and degenerative disc disease. 3. Aortic atherosclerosis.     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 (stage ypT3c), predominantly viable with focal fibrosis and hemosiderin laden macrophages (possible mild treatment effect)  B: Ovary and fallopian tube, right, salpingo-oophorectomy - High grade serous carcinoma involving right ovary and fallopian tube with surface involvement, size up to 8 cm  - Areas of necrosis and hemosiderin laden macrophages suggestive of treatment effect - 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 - See synoptic report and comment  C: Ovary and fallopian tube, left, salpingo-oophorectomy - High grade serous  carcinoma involving the left fallopian tube, size up to 0.5 cm - Ovary with no definite involvement by carcinoma identified - Nodular area of endometriosis and peritoneal inclusion cyst also present - See synoptic report and comment   Procedure:    Bilateral salpingo-oophorectomy    Procedure:    Omentectomy    Specimen Integrity of Right Ovary:    Intact with surface involvement by tumor    Specimen Integrity of Left Ovary:    Capsule intact   TUMOR Tumor Site:    Right fallopian tube: favor as primary site; also involves right ovary and left fallopian tube  Histologic Type:    Serous carcinoma  Histologic Grade:    High grade  Tumor Size:    Greatest Dimension (Centimeters): in fallopian tube: 1.0 cm; in ovary: 8.0 cm Ovarian Surface Involvement:    Present    Laterality:    Right  Fallopian Tube Surface Involvement:    Present    Laterality:    Bilateral  Other Tissue / Organ Involvement:    Right ovary  Other Tissue / Organ Involvement:    Right fallopian tube  Other Tissue / Organ Involvement:    Left fallopian tube  Other Tissue / Organ Involvement:    Omentum  Largest Extrapelvic Peritoneal Focus:    Macroscopic (greater than 2 cm)  Peritoneal / Ascitic Fluid:    Not submitted / unknown  Pleural Fluid:    Not submitted / unknown  Treatment Effect:    No definite or minimal response identified (chemotherapy response score [CRS] 1)   LYMPH NODES Regional Lymph Nodes:    No lymph nodes submitted or found   PATHOLOGIC STAGE CLASSIFICATION (pTNM, AJCC 8th Edition) TNM Descriptors:    y (post-treatment)  Primary Tumor (pT):    pT3c  Regional Lymph Nodes (pN):    pNX   FIGO STAGE FIGO Stage:    IIIC   ADDITIONAL FINDINGS Additional Findings:    Serous tubal intraepithelial carcinoma (STIC)  Additional Findings:    Left ovary with no definite carcinoma identified, left-sided nodule of endometriosis and peritoneal inclusion cyst    10/18/2020 - 10/20/2020 Hospital Admission    She was admitted to [facility] for interval debulking surgery   11/19/2020 Tumor Marker   Patient's tumor was tested for the following markers: CA-125. Results of the tumor marker test revealed 38.3   12/12/2020 Tumor Marker   Patient's tumor was tested for the following markers CA-125. Results of the tumor marker test revealed 42.3.   01/02/2021 Tumor Marker   Patient's tumor was tested for the following markers: CA-125. Results of the tumor marker test revealed 37.3   01/29/2021 Tumor Marker   Patient's tumor was tested for the following markers: CA-125 Results of the tumor marker test revealed 44.3   01/29/2021 Imaging   1. Interval increase in loculated appearing ascites in the low central abdomen and pelvis. 2. There is extensive peritoneal nodularity surrounding this fluid, the nodularity itself not appreciably changed in appearance compared to prior examination. 3. Additional peritoneal nodularity and/or mesenteric lymph nodes are unchanged. 4. Interval decrease in size of a low-attenuation nodule overlying the right hemidiaphragm, now measuring 1.2 cm, previously 1.8 cm when measured similarly. Findings are consistent with treatment response of a metastatic nodule. No other evidence of intrathoracic metastatic disease. 5. Status post hysterectomy, oophorectomy, and omentectomy.   01/31/2021 - 08/26/2021 Chemotherapy   She is started on Olaparib       03/04/2021 Tumor Marker   Patient's tumor was tested for the following markers: CA-125 Results of the tumor marker test revealed 39.7   04/01/2021 Tumor Marker   Patient's tumor was tested for the following markers: CA-125 Results of the tumor marker test revealed 32.3   05/10/2021 Imaging   1. No significant change in peritoneal carcinomatosis since previous study of 3 months ago, primarily in the pelvis where there is complex fluid and peritoneal nodularity. 2. No evidence of solid visceral organ metastasis. No evidence of bowel  or ureteral obstruction. 3.  Aortic Atherosclerosis (ICD10-I70.0).   06/13/2021 Tumor Marker   Patient's tumor was tested for the following markers: CA-125. Results of the tumor marker test revealed 48.3.   07/29/2021 Tumor Marker   Patient's tumor was tested for the following markers: CA-125. Results of the tumor marker test revealed 54.   08/25/2021 Imaging   1. There is extensive peritoneal soft tissue thickening and nodularity, particularly about the low right hemipelvis, which is similar to prior examination. Some small peritoneal nodules throughout the abdomen and pelvis are slightly enlarged, other nodules are unchanged. 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 2. No significant change in a subpleural nodule overlying the right hemidiaphragm. 3. Unchanged, loculated small volume pelvic ascites. 4. Status post hysterectomy, oophorectomy, and omentectomy. 5. Coronary artery disease.   Aortic Atherosclerosis (ICD10-I70.0).     08/26/2021 Tumor Marker   Patient's tumor was tested for the following markers: CA-125. Results of the tumor marker test revealed 56.   09/04/2021 Tumor Marker   Patient's tumor was tested for the following markers: CA-125. Results of the tumor marker test revealed 55.4.   09/05/2021 - 05/21/2022 Chemotherapy   Patient is on Treatment Plan : OVARIAN RECURRENT 3RD LINE Carboplatin D1 / Gemcitabine D1,8 (4/800) q21d     10/07/2021 Tumor Marker   Patient's tumor was tested for the following markers: CA-125. Results of the tumor marker test revealed 51.9.   11/05/2021 Tumor Marker   Patient's  tumor was tested for the following markers: CA-125. Results of the tumor marker test revealed 45.6.   11/27/2021 Imaging   1. Overall stable to slightly improved ill-defined soft tissue density throughout the pelvis. 2. Slight interval decrease in size of the omental lesions. 3. Stable free pelvic fluid. 4. New surgical  changes from a right hip replacement. 5. New nodular and somewhat triangular density at the left lung base is likely an area of atelectasis. 6. Aortic atherosclerosis.   12/24/2021 Tumor Marker   Patient's tumor was tested for the following markers: CA-125. Results of the tumor marker test revealed 39.6.   02/04/2022 Tumor Marker   Patient's tumor was tested for the following markers: CA-125. Results of the tumor marker test revealed 36.3.   03/05/2022 Imaging   1. Similar to mild interval increase in size of right pericolic goiter nodules and omental nodules. 2. Similar appearance of the amorphous soft tissue within the pelvis. There is decreased fluid component   03/06/2022 Tumor Marker   Patient's tumor was tested for the following markers: CA-125. Results of the tumor marker test revealed 56.2.   04/07/2022 Tumor Marker   Patient's tumor was tested for the following markers: CA-125. Results of the tumor marker test revealed 65.5.   05/13/2022 Tumor Marker   Patient's tumor was tested for the following markers: CA-125. Results of the tumor marker test revealed 66.5.   06/12/2022 Imaging   1. Slight increase in prominence of the peritoneal and omental tumor deposits, which are most thickly banded in the lower pelvis. 2. Questionable wall thickening in the descending duodenum, cannot exclude duodenitis. 3. Other imaging findings of potential clinical significance: Bibasilar scarring. Aortic Atherosclerosis (ICD10-I70.0). Cholelithiasis.   07/08/2022 - 07/29/2022 Chemotherapy   Patient is on Treatment Plan : ovarian Docetaxel q21d     07/08/2022 - 11/03/2022 Chemotherapy   Patient is on Treatment Plan : BREAST Docetaxel (100) q21d     07/09/2022 Tumor Marker   Patient's tumor was tested for the following markers: CA-125. Results of the tumor marker test revealed 103..   08/20/2022 Tumor Marker   Patient's tumor was tested for the following markers: CA-125. Results of the tumor marker test  revealed 109.   10/30/2022 Tumor Marker   Patient's tumor was tested for the following markers: CA-125. Results of the tumor marker test revealed 45.   10/30/2022 Imaging   1. Increased size of subpleural nodule overlying the right hemidiaphragm, concerning for progressive metastatic disease. 2. Trace abdominal ascites with stable peritoneal nodularity.     PHYSICAL EXAMINATION: ECOG PERFORMANCE STATUS: 2 - Symptomatic, <50% confined to bed  Vitals:   12/08/22 1042  BP: (!) 142/63  Pulse: 88  Resp: 18  Temp: 97.7 F (36.5 C)  SpO2: 100%   Filed Weights   12/08/22 1042  Weight: 154 lb (69.9 kg)    GENERAL:alert, no distress and comfortable ABDOMEN:abdomen appears distended She has bilateral lower extremity edema NEURO: alert & oriented x 3 with fluent speech, no focal motor/sensory deficits  LABORATORY DATA:  I have reviewed the data as listed    Component Value Date/Time   NA 145 11/20/2022 1602   NA 141 11/06/2016 1102   K 4.6 11/20/2022 1602   K 3.9 11/06/2016 1102   CL 113 (H) 11/20/2022 1602   CO2 26 11/20/2022 1602   CO2 27 11/06/2016 1102   GLUCOSE 137 (H) 11/20/2022 1602   GLUCOSE 130 11/06/2016 1102   BUN 16 11/20/2022 1602   BUN 18.0  11/06/2016 1102   CREATININE 0.94 11/20/2022 1602   CREATININE 1.3 (H) 11/06/2016 1102   CALCIUM 8.8 11/20/2022 1602   CALCIUM 10.2 11/06/2016 1102   PROT 5.3 (L) 11/20/2022 1602   PROT 7.1 11/06/2016 1102   ALBUMIN 1.8 (L) 11/10/2022 0500   ALBUMIN 3.7 11/06/2016 1102   AST 49 (H) 11/20/2022 1602   AST 42 (H) 10/28/2022 1100   AST 52 (H) 11/06/2016 1102   ALT 18 11/20/2022 1602   ALT 14 10/28/2022 1100   ALT 58 (H) 11/06/2016 1102   ALKPHOS 131 (H) 11/10/2022 0500   ALKPHOS 159 (H) 11/06/2016 1102   BILITOT 0.6 11/20/2022 1602   BILITOT 0.8 10/28/2022 1100   BILITOT 0.53 11/06/2016 1102   GFRNONAA 44 (L) 11/10/2022 0500   GFRNONAA 54 (L) 10/28/2022 1100   GFRNONAA 34 (L) 02/21/2021 1414   GFRAA 40 (L)  02/21/2021 1414    No results found for: "SPEP", "UPEP"  Lab Results  Component Value Date   WBC 6.1 11/20/2022   NEUTROABS 4,276 11/20/2022   HGB 9.7 (L) 11/20/2022   HCT 29.7 (L) 11/20/2022   MCV 98.7 11/20/2022   PLT 132 (L) 11/20/2022      Chemistry      Component Value Date/Time   NA 145 11/20/2022 1602   NA 141 11/06/2016 1102   K 4.6 11/20/2022 1602   K 3.9 11/06/2016 1102   CL 113 (H) 11/20/2022 1602   CO2 26 11/20/2022 1602   CO2 27 11/06/2016 1102   BUN 16 11/20/2022 1602   BUN 18.0 11/06/2016 1102   CREATININE 0.94 11/20/2022 1602   CREATININE 1.3 (H) 11/06/2016 1102      Component Value Date/Time   CALCIUM 8.8 11/20/2022 1602   CALCIUM 10.2 11/06/2016 1102   ALKPHOS 131 (H) 11/10/2022 0500   ALKPHOS 159 (H) 11/06/2016 1102   AST 49 (H) 11/20/2022 1602   AST 42 (H) 10/28/2022 1100   AST 52 (H) 11/06/2016 1102   ALT 18 11/20/2022 1602   ALT 14 10/28/2022 1100   ALT 58 (H) 11/06/2016 1102   BILITOT 0.6 11/20/2022 1602   BILITOT 0.8 10/28/2022 1100   BILITOT 0.53 11/06/2016 1102

## 2022-12-08 NOTE — Assessment & Plan Note (Signed)
We have numerous goals of care discussions in the past We discussed the role of palliative care with hospice services The patient is not ready to accept hospice services at this point but if she decide she is ready, we will contact palliative care service In the meantime, she will continue home-based palliative care only

## 2022-12-08 NOTE — Assessment & Plan Note (Signed)
She had paracentesis performed last year We discussed risk and benefits of paracentesis She does not need repeat paracentesis today

## 2022-12-08 NOTE — Assessment & Plan Note (Signed)
She is intolerant to olaparib and Taxotere despite significant dose adjustment She has progressed on carboplatin, paclitaxel and gemcitabine  I have reviewed recommendation from [facility] cancer center We discussed again the rationale behind palliative chemotherapy I have discussed this with the family before although the patient did not remember much of our conversation while she was seen in the hospital I told the patient I am not willing to prescribe any more chemotherapy due to significant complications with recurrent hospitalization secondary to immunosuppression from chemotherapy and severe pancytopenia, this is despite significant dose reduction and G-CSF support At this point in time, due to her comorbidities, the risk of chemotherapy outweighs the benefit If the patient is not willing to discontinue chemotherapy and desired further palliative chemotherapy, I recommend the patient to pursue systemic treatment in another institution

## 2022-12-09 ENCOUNTER — Other Ambulatory Visit: Payer: Self-pay | Admitting: Internal Medicine

## 2022-12-09 ENCOUNTER — Other Ambulatory Visit: Payer: PPO

## 2022-12-09 ENCOUNTER — Telehealth: Payer: Self-pay

## 2022-12-09 VITALS — BP 138/64 | HR 86 | Temp 97.6°F | Resp 18

## 2022-12-09 DIAGNOSIS — Z515 Encounter for palliative care: Secondary | ICD-10-CM

## 2022-12-09 DIAGNOSIS — C799 Secondary malignant neoplasm of unspecified site: Secondary | ICD-10-CM

## 2022-12-09 NOTE — Progress Notes (Signed)
[place] Palliative Encounter Note   PATIENT NAME: Kayla Baker DOB: 1936/01/16 MRN: [id number]  PRIMARY CARE PROVIDER: [name], MD  RESPONSIBLE PARTY:  Acct ID - Guarantor Home Phone Work Phone Relationship Acct Type  [id number] Kayla Baker [phone]  Self P/F     [address], [place], [place] [address]   RN completed home visit. Husband  also present .      HISTORY OF PRESENT ILLNESS:  Pt has ovarian cancer. Treatment has been discontinued at this time.  Also has ascites and has had several paracentesis for fluid drainage in the past.  Socially: Pt sitting in living area off of kitchen with husband. Smiling and in good spirits. Reports that they had a good holiday season.  Cognitive:  Alert and oriented x 3.    Appetite: Eating is not really improved. "can't really think of anything I want to eat."   Mobility: pt walking around while RN is visiting. Balance is good. No use of Baker or walker. Denies any falls.   Sleeping Pattern:  No issues.   Pain: Denies any pain. C/O neuropathy tingling in fingers and toes. Reports taking gabapentin for neuropathy.  CODE STATUS: Full code  ADVANCED DIRECTIVES: N MOST FORM: N PPS:    PHYSICAL EXAM:   VITALS: Today's Vitals   12/09/22 1122  BP: 138/64  Pulse: 86  Resp: 18  Temp: 97.6 F (36.4 C)  TempSrc: Tympanic  SpO2: 95%  PainSc: 0-No pain    LUNGS: lung sounds clear bilaterally. No SOB CARDIAC: Regular, Edema 2+ noted in bilateral lower extremities up from toes up to knees. R>L. Pedal pulses present EXTREMITIES: MAE x 4. No weakness notes at present. SKIN:  5 cm laceration noted to left foot. Sutures noted intact with edges approximated, no edema, erythema, or drainage. NEURO: Alert and oriented. Appropriate questions and responses.  Summary of visit: Pt smiling and initiating conversation today. Reports that she dropped a board from mantle on left foot. 5 cm laceration noted. Pt reports that she went to  [facility] ER and had sutures. Sutures noted with edges approximated, no edema, erythema, or drainage. Great niece who is a NP will come to the house to take out sutures today. Hair is growing out. Some edema noted to abdomen. Pt states, "they pulled 2.2 Liters out of my abdomen right after you were here last time." Pt reported that she went to [facility] for a second opinion to continue chemo tx. RN talked to pt and spouse extensively about Hospice. Agreed to referral. RN will contact Dr. Eddie North.      Kayla Cane, RN

## 2022-12-09 NOTE — Telephone Encounter (Addendum)
Returned her call. She and her husband have decided to go with hospice after having along discussion. Palliative care came out earlier and is aware.  She would like thank all the staff and Dr. [name] for everything over the years. She appreciates all the care.

## 2022-12-17 DIAGNOSIS — R911 Solitary pulmonary nodule: Secondary | ICD-10-CM | POA: Diagnosis not present

## 2022-12-17 DIAGNOSIS — C561 Malignant neoplasm of right ovary: Secondary | ICD-10-CM | POA: Diagnosis not present

## 2022-12-17 DIAGNOSIS — Z9221 Personal history of antineoplastic chemotherapy: Secondary | ICD-10-CM | POA: Diagnosis not present

## 2022-12-17 DIAGNOSIS — N183 Chronic kidney disease, stage 3 unspecified: Secondary | ICD-10-CM | POA: Diagnosis not present

## 2022-12-24 ENCOUNTER — Encounter: Payer: Self-pay | Admitting: Internal Medicine

## 2023-01-08 ENCOUNTER — Emergency Department (HOSPITAL_BASED_OUTPATIENT_CLINIC_OR_DEPARTMENT_OTHER)

## 2023-01-08 ENCOUNTER — Other Ambulatory Visit: Payer: Self-pay

## 2023-01-08 ENCOUNTER — Telehealth: Payer: Self-pay

## 2023-01-08 ENCOUNTER — Emergency Department (HOSPITAL_BASED_OUTPATIENT_CLINIC_OR_DEPARTMENT_OTHER)
Admission: EM | Admit: 2023-01-08 | Discharge: 2023-01-08 | Disposition: A | Discharge status: Home / Self Care | Attending: Emergency Medicine | Admitting: Emergency Medicine

## 2023-01-08 ENCOUNTER — Encounter (HOSPITAL_BASED_OUTPATIENT_CLINIC_OR_DEPARTMENT_OTHER): Payer: Self-pay

## 2023-01-08 DIAGNOSIS — I10 Essential (primary) hypertension: Secondary | ICD-10-CM | POA: Diagnosis not present

## 2023-01-08 DIAGNOSIS — M79669 Pain in unspecified lower leg: Secondary | ICD-10-CM | POA: Insufficient documentation

## 2023-01-08 DIAGNOSIS — G319 Degenerative disease of nervous system, unspecified: Secondary | ICD-10-CM | POA: Diagnosis not present

## 2023-01-08 DIAGNOSIS — D61818 Other pancytopenia: Secondary | ICD-10-CM | POA: Insufficient documentation

## 2023-01-08 DIAGNOSIS — Z7984 Long term (current) use of oral hypoglycemic drugs: Secondary | ICD-10-CM | POA: Insufficient documentation

## 2023-01-08 DIAGNOSIS — Z79899 Other long term (current) drug therapy: Secondary | ICD-10-CM | POA: Diagnosis not present

## 2023-01-08 DIAGNOSIS — H53143 Visual discomfort, bilateral: Secondary | ICD-10-CM | POA: Insufficient documentation

## 2023-01-08 DIAGNOSIS — Z7982 Long term (current) use of aspirin: Secondary | ICD-10-CM | POA: Diagnosis not present

## 2023-01-08 DIAGNOSIS — Z9010 Acquired absence of unspecified breast and nipple: Secondary | ICD-10-CM | POA: Insufficient documentation

## 2023-01-08 DIAGNOSIS — R791 Abnormal coagulation profile: Secondary | ICD-10-CM | POA: Diagnosis not present

## 2023-01-08 DIAGNOSIS — H538 Other visual disturbances: Secondary | ICD-10-CM | POA: Insufficient documentation

## 2023-01-08 DIAGNOSIS — Z8543 Personal history of malignant neoplasm of ovary: Secondary | ICD-10-CM | POA: Diagnosis not present

## 2023-01-08 LAB — CBC WITH DIFFERENTIAL/PLATELET
Abs Immature Granulocytes: 0.01 10*3/uL (ref 0.00–0.07)
Basophils Absolute: 0 10*3/uL (ref 0.0–0.1)
Basophils Relative: 1 %
Eosinophils Absolute: 0.1 10*3/uL (ref 0.0–0.5)
Eosinophils Relative: 4 %
HCT: 33.2 % — ABNORMAL LOW (ref 36.0–46.0)
Hemoglobin: 11.1 g/dL — ABNORMAL LOW (ref 12.0–15.0)
Immature Granulocytes: 0 %
Lymphocytes Relative: 39 %
Lymphs Abs: 1.2 10*3/uL (ref 0.7–4.0)
MCH: 32 pg (ref 26.0–34.0)
MCHC: 33.4 g/dL (ref 30.0–36.0)
MCV: 95.7 fL (ref 80.0–100.0)
Monocytes Absolute: 0.5 10*3/uL (ref 0.1–1.0)
Monocytes Relative: 16 %
Neutro Abs: 1.2 10*3/uL — ABNORMAL LOW (ref 1.7–7.7)
Neutrophils Relative %: 40 %
Platelets: 89 10*3/uL — ABNORMAL LOW (ref 150–400)
RBC: 3.47 MIL/uL — ABNORMAL LOW (ref 3.87–5.11)
RDW: 15.2 % (ref 11.5–15.5)
WBC: 3 10*3/uL — ABNORMAL LOW (ref 4.0–10.5)
nRBC: 0 % (ref 0.0–0.2)

## 2023-01-08 LAB — COMPREHENSIVE METABOLIC PANEL
ALT: 25 U/L (ref 0–44)
AST: 62 U/L — ABNORMAL HIGH (ref 15–41)
Albumin: 3 g/dL — ABNORMAL LOW (ref 3.5–5.0)
Alkaline Phosphatase: 140 U/L — ABNORMAL HIGH (ref 38–126)
Anion gap: 6 (ref 5–15)
BUN: 18 mg/dL (ref 8–23)
CO2: 26 mmol/L (ref 22–32)
Calcium: 9.3 mg/dL (ref 8.9–10.3)
Chloride: 107 mmol/L (ref 98–111)
Creatinine, Ser: 1.3 mg/dL — ABNORMAL HIGH (ref 0.44–1.00)
GFR, Estimated: 41 mL/min — ABNORMAL LOW (ref >60)
Glucose, Bld: 90 mg/dL (ref 70–99)
Potassium: 3.5 mmol/L (ref 3.5–5.1)
Sodium: 139 mmol/L (ref 135–145)
Total Bilirubin: 0.9 mg/dL (ref 0.3–1.2)
Total Protein: 7 g/dL (ref 6.5–8.1)

## 2023-01-08 LAB — URINALYSIS, ROUTINE W REFLEX MICROSCOPIC
Bilirubin Urine: NEGATIVE
Glucose, UA: NEGATIVE mg/dL
Hgb urine dipstick: NEGATIVE
Ketones, ur: NEGATIVE mg/dL
Leukocytes,Ua: NEGATIVE
Nitrite: NEGATIVE
Protein, ur: NEGATIVE mg/dL
Specific Gravity, Urine: 1.01 (ref 1.005–1.030)
pH: 7 (ref 5.0–8.0)

## 2023-01-08 LAB — PROTIME-INR
INR: 1.2 (ref 0.8–1.2)
Prothrombin Time: 15.4 seconds — ABNORMAL HIGH (ref 11.4–15.2)

## 2023-01-08 NOTE — ED Provider Notes (Signed)
[facility] Provider Note   CSN: [id number] Arrival date & time: 01/08/23  1527     History  Chief Complaint  Patient presents with   Blurred Vision   Leg Pain    Kayla Baker is a 87 y.o. female. 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  Patient says vision just does not seem clear and more blurred in both eyes.  It does not improve with closing 1 eye or the other.  It is not diplopia and there is no vision deficits or losses or field deficit.  She denies any associated symptoms no headache, no eye pain, no slurred speech, no focal weakness numbness or tingling.  She called her ID doctor thinking it was related to her daily oral antibiotic which she is taking for a year but he thought it may be related to her chemotherapy.  However she does not feel like it was related to her chemotherapy because she did last had chemotherapy in December and has since discontinued.  She denies any metastases to the brain that that she is aware of.  She was taken off chemotherapy for palliative purposes.  She has had no fevers, no nausea, no vomiting.  She is on Lasix for the past 2 weeks for swelling in her legs.  That is the only other new medicine that she has started.  She also has diabetes and is supposed be wearing glasses but has not been wearing the glasses.  She feels like her vision has been worsening with time and also needs probably new glasses.   Leg Pain      Home Medications Prior to Admission medications   Medication Sig Start Date End Date Taking? Authorizing Provider  acetaminophen (TYLENOL) 500 MG tablet Take 1,000 mg by mouth every 6 (six) hours as needed for mild pain, headache or  fever.    [provider]  allopurinol (ZYLOPRIM) 100 MG tablet Take 1 tablet (100 mg total) by mouth daily. FOR GOUT PREVENTION, Patient taking differently: Take 100 mg by mouth in the morning. 03/25/22   Liane Comber, NP  aspirin EC 81 MG tablet Take 1 tablet (81 mg total) by mouth 2 (two) times daily. Swallow whole. 09/02/22   Eugenie Filler, MD  cefadroxil (DURICEF) 500 MG capsule Take 2 capsules (1,000 mg total) by mouth 2 (two) times daily. 09/29/22   Truman Hayward, MD  Cholecalciferol (VITAMIN D3) 5000 units CAPS Take 5,000 Units by mouth daily.    [provider]  cyanocobalamin 1000 MCG tablet Take 1,000 mcg by mouth daily.    [provider]  Ensure Max Protein (ENSURE MAX PROTEIN) LIQD Take 330 mLs (11 oz total) by mouth 2 (two) times daily. 09/02/22   Eugenie Filler, MD  ezetimibe (ZETIA) 10 MG tablet TAKE 1 TABLET BY MOUTH DAILY FOR CHOLESTEROL Patient taking differently: Take 10 mg by mouth daily. 04/24/22   Liane Comber, NP  gabapentin (NEURONTIN) 300 MG capsule Take 1 capsule 4 x  /day for Pain Patient taking differently: Take 300 mg by mouth 4 (four) times daily. 12/07/21   [name], MD  glimepiride (AMARYL) 1 MG tablet Take 1 tab by mouth in the morning if fasting is running 150+. Please do not take  this medication if you are ill or not eating as this can cause a low blood sugar. 05/04/20   Liane Comber, NP  glucose blood test strip Use as instructed 11/06/22   Darrol Jump, NP  hydrochlorothiazide (MICROZIDE) 12.5 MG capsule Take 1 capsule (12.5 mg total) by mouth daily. 11/20/22 12/20/22  Darrol Jump, NP  Lancets (FREESTYLE) lancets USE TO CHECK BLOOD GLUCOSE ONCE DAILY AS DIRECTED 11/06/22   Darrol Jump, NP  lidocaine-prilocaine (EMLA) cream Apply 1 application. topically daily as needed (for port access). 03/07/22   Heath Lark, MD  magnesium oxide (MAG-OX) 400 (240 Mg) MG tablet TAKE 1 TABLET(400 MG) BY MOUTH DAILY  12/02/22   Heath Lark, MD  metoprolol succinate (TOPROL-XL) 25 MG 24 hr tablet Take  1 tablet  Daily  for BP                                                          /                                       TAKE                            BY                                 MOUTH Patient taking differently: Take 25 mg by mouth in the morning. 09/21/22   [name], MD  ondansetron (ZOFRAN) 8 MG tablet Take 1 tablet (8 mg total) by mouth every 8 (eight) hours as needed for refractory nausea / vomiting. 06/13/22   Heath Lark, MD  oxyCODONE (OXY IR/ROXICODONE) 5 MG immediate release tablet Take 1 tablet (5 mg total) by mouth every 6 (six) hours as needed for severe pain or moderate pain. 11/18/22   Heath Lark, MD  pantoprazole (PROTONIX) 40 MG tablet TAKE 1 TABLET(40 MG) BY MOUTH DAILY Patient taking differently: Take 40 mg by mouth daily before breakfast. 10/31/22   Heath Lark, MD  polyethylene glycol (MIRALAX) 17 g packet Take 17 g by mouth 2 (two) times daily. 17 grams in 6 oz of favorite drink twice a day until bowel movement.  LAXITIVE.  Restart if two days since last bowel movement Patient taking differently: Take 17 g by mouth daily as needed for mild constipation. 11/10/21   Shepperson, Kirstin, PA-C  rosuvastatin (CRESTOR) 5 MG tablet TAKE 1 TABLET BY MOUTH IN THE EVENING THREE TIMES WEEKLY FOR CHOLESTEROL Patient taking differently: Take 5 mg by mouth every Monday, Wednesday, and Friday. 03/07/22   Liane Comber, NP  trolamine salicylate (ASPERCREME) 10 % cream Apply 1 application  topically as needed (to both feet for neuropathic pain).    [provider]  vitamin C (ASCORBIC ACID) 500 MG tablet Take 500 mg by mouth every evening.     [provider]      Allergies    Mobic [meloxicam], Prednisone, Premarin [estrogens conjugated], and Trovan [alatrofloxacin]    Review of Systems   Review of Systems  Physical Exam Updated Vital Signs BP (!) 158/69   Pulse 69  Temp 97.7 F (36.5 C) (Oral)   Resp 14   Ht '5\' 4"'$  (1.626 m)   Wt 61.2 kg   SpO2 100%   BMI 23.17 kg/m  Physical Exam Eye exam summary:  Right Left   LLL(lids, lahses,lacrimals) No lesions No lesions  CS(conjunctiva, sclera) White and quiet White and quiet  Pupils/Iris Round and reactive Round and reactive  Acuity 20/50 uncorrected 20/50 uncorrected  Vision fields Intact in all 4 quadrants of both eyes  EOM Intact without complaints  Retina NA  Constitutional: Alert and oriented.  GCS 15 chronically ill-appearing Eyes: Conjunctivae are normal. ENT      Head: Normocephalic and atraumatic. Cardiovascular: S1, S2, regular rate, warm and dry Respiratory: Normal respiratory effort.  O2 sat 97 on RA Gastrointestinal: Soft and nontender.  Musculoskeletal: Normal range of motion in all extremities. 3+ equal bilateral nontender pitting edema of the lower extremities to the knee Neurologic: Normal speech and language.  CN II through XII grossly intact.  No visual field deficits.  No drift of bilateral upper and lower extremities, 5 out of 5 strength bilateral upper and lower extremities.  Sensation grossly intact.  No gross focal neurologic deficits are appreciated. Skin: Skin is warm, dry  Psychiatric: Mood and affect are normal. Speech and behavior are normal.   ED Results / Procedures / Treatments   Labs (all labs ordered are listed, but only abnormal results are displayed) Labs Reviewed  COMPREHENSIVE METABOLIC PANEL - Abnormal; Notable for the following components:      Result Value   Creatinine, Ser 1.30 (*)    Albumin 3.0 (*)    AST 62 (*)    Alkaline Phosphatase 140 (*)    GFR, Estimated 41 (*)    All other components within normal limits  CBC WITH DIFFERENTIAL/PLATELET - Abnormal; Notable for the following components:   WBC 3.0 (*)    RBC 3.47 (*)    Hemoglobin 11.1 (*)    HCT 33.2 (*)    Platelets 89 (*)    Neutro Abs 1.2 (*)    All other components within normal  limits  PROTIME-INR - Abnormal; Notable for the following components:   Prothrombin Time 15.4 (*)    All other components within normal limits  URINALYSIS, ROUTINE W REFLEX MICROSCOPIC    EKG EKG Interpretation  Date/Time:  Thursday January 08 2023 15:38:43 EST Ventricular Rate:  76 PR Interval:  155 QRS Duration: 97 QT Interval:  396 QTC Calculation: 446 R Axis:   27 Text Interpretation: Sinus rhythm Confirmed by Georgina Snell [id number]) on 01/08/2023 4:39:14 PM  Radiology CT Head Wo Contrast  Result Date: 01/08/2023 CLINICAL DATA:  Blurred vision EXAM: CT HEAD WITHOUT CONTRAST TECHNIQUE: Contiguous axial images were obtained from the base of the skull through the vertex without intravenous contrast. RADIATION DOSE REDUCTION: This exam was performed according to the departmental dose-optimization program which includes automated exposure control, adjustment of the mA and/or kV according to patient size and/or use of iterative reconstruction technique. COMPARISON:  11/06/2022 FINDINGS: Brain: No evidence of acute infarction, hemorrhage, hydrocephalus, extra-axial collection or mass lesion/mass effect. Scattered low-density changes within the periventricular and subcortical white matter compatible with chronic microvascular ischemic change. Mild diffuse cerebral volume loss. Vascular: Atherosclerotic calcifications involving the large vessels of the skull base. No unexpected hyperdense vessel. Skull: Normal. Negative for fracture or focal lesion. Sinuses/Orbits: No acute finding. Other: None. IMPRESSION: No acute intracranial findings. Electronically Signed   By: Davina Poke D.O.  On: 01/08/2023 16:30    Procedures Procedures  Remain on constant cardiac monitoring sinus rhythm with normal rates.  Medications Ordered in ED Medications - No data to display  ED Course/ Medical Decision Making/ A&P   {                            Medical Decision Making GUYNELL KLEIBER is a 87 y.o.  female. 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   Patient's complaints could have multiple underlying etiologies including but not limited to ophthalmologic disease (such as cataracts, diabetic retinopathy,aging), medication side effects, among multiple etiologies. Neurologically, she has no visual field loss, no diplopia, no other complaints and normal neurologic exam which makes me have low suspicion of stroke/CVA. I offered transfer to [facility] for MRI brain w/wo contrast to rule out neurologic cause such as metastatic disease although I have low suspicion but patient politely declined as she does not want to do MRI. She has no pain or erythema or external changes concerning for iritis, conjunctivitis, acute angle closure glaucoma.   CT head obtained which I personally reviewed no evidence of ICH.  Per radiology no acute intracranial findings.  Labs obtained and reviewed by me.  Mild pancytopenia appears to be at baseline without fevers or infectious symptoms.  UA negative for UTI.  CMP with creatinine 1.3 at baseline, normal sodium 139 potassium 0.5, glucose 90.  Spoke with RPH Eliseo Gum reviewed patient's med list, only possible underlying medication that could contribute to blurred vision would be gabapentin.  As I discussed, I offered patient MRI brain with and without contrast however she declines at this time and does not want to do that.  She would rather follow-up with PCP outpatient.  I will also provide ophthalmology as I do suspect this is more likely consistent with ophthalmologic disease.  Discussed discontinuing gabapentin or tapering off as well.  Discussed any stroke symptoms or further progression of disease  and reasons to return to ED.  She is in agreement with plan and safe for discharge at this time.  Amount and/or Complexity of Data Reviewed Labs: ordered. Radiology: ordered.   Final Clinical Impression(s) / ED Diagnoses Final diagnoses:  Blurry vision, bilateral    Rx / DC Orders ED Discharge Orders     None         Elgie Congo, MD 01/08/23 2025

## 2023-01-08 NOTE — ED Triage Notes (Signed)
C/o blurred vision x 1 week. Also states on her great toe on bilateral feet are coming up. Pain to bilateral lower extremities.  Currently on abx for bacterial infection in hip.

## 2023-01-08 NOTE — Telephone Encounter (Signed)
Patient left voicemail on triage nurse line requesting sooner appointment to see Dr.Van Dam due to currently having blurred vision, blisters around toes and toe nail cracked. Per Dr.Van Dam patient needs to go to the ED. He is concerned one of her chemo drugs is causing an adverse reaction.    I contacted patient on home and cell number, left voicemail asking patient to return my call.    [place], CMA

## 2023-01-08 NOTE — ED Notes (Signed)
Pt. Has multiple complaints.  Pt. Reports she has pus drainage from under her great toe nails.

## 2023-01-08 NOTE — Telephone Encounter (Signed)
Patient aware of Dr.Van Dam recommendation to go to the ED. Patient voiced her understanding.    [place], CMA

## 2023-01-08 NOTE — Discharge Instructions (Addendum)
You were seen for blurry vision today.  Overall your physical exam, imaging and labs are reassuring.  As we discussed, you likely require more follow-up.  Please make an appoint with Dr. Talbert Forest, the ophthalmologist (eye doctor), for further workup.  The only medications that the pharmacist things could contributed to your symptoms would be gabapentin.  Please make an appointment with your doctor regarding your visit to the ER today.  As we discussed, you may require MRI in the outpatient setting of the brain.  Come back if any severe eye pain, severe headaches, loss of vision, weakness in the arms and legs, loss of sensation, slurred speech, or any other symptoms concerning to you.

## 2023-01-08 NOTE — ED Notes (Signed)
Pt. Has noted yellow toenail beds with reports of a discharge coming from underneath her toenail beds.  Pt. States this has been happening for a while.

## 2023-01-29 ENCOUNTER — Ambulatory Visit (INDEPENDENT_AMBULATORY_CARE_PROVIDER_SITE_OTHER): Admitting: Infectious Disease

## 2023-01-29 ENCOUNTER — Encounter: Payer: Self-pay | Admitting: Infectious Disease

## 2023-01-29 ENCOUNTER — Other Ambulatory Visit: Payer: Self-pay

## 2023-01-29 VITALS — BP 109/71 | HR 78 | Temp 97.7°F | Resp 16 | Wt 136.9 lb

## 2023-01-29 DIAGNOSIS — C569 Malignant neoplasm of unspecified ovary: Secondary | ICD-10-CM | POA: Diagnosis not present

## 2023-01-29 DIAGNOSIS — T8459XD Infection and inflammatory reaction due to other internal joint prosthesis, subsequent encounter: Secondary | ICD-10-CM

## 2023-01-29 DIAGNOSIS — Z96649 Presence of unspecified artificial hip joint: Secondary | ICD-10-CM

## 2023-01-29 DIAGNOSIS — A4901 Methicillin susceptible Staphylococcus aureus infection, unspecified site: Secondary | ICD-10-CM | POA: Diagnosis not present

## 2023-01-29 DIAGNOSIS — D61818 Other pancytopenia: Secondary | ICD-10-CM | POA: Diagnosis not present

## 2023-01-29 DIAGNOSIS — Z7189 Other specified counseling: Secondary | ICD-10-CM

## 2023-01-29 HISTORY — DX: Methicillin susceptible Staphylococcus aureus infection, unspecified site: A49.01

## 2023-01-29 MED ORDER — CEFADROXIL 500 MG PO CAPS: 1000.0000 mg | ORAL_CAPSULE | Freq: Two times a day (BID) | ORAL | 11 refills | Status: DC

## 2023-01-29 NOTE — Progress Notes (Signed)
Subjective:  Chief complaint: follow-up for prosthetic joint infection    Patient ID: Kayla Baker, female    DOB: 10-02-1936, 87 y.o.   MRN: [id number]  HPI  87 y.o. female with hx of HTN, hyperlipidemia, breast cancer, cephalosporin allergy and recent ovarian cancer with carcinomatosis who presented to ER w abdominal pain (her ascites) and right  hip pain with abscess found in gluteal muscles that appears to communicate with hip. She is now sp I and D and polyexchange in OR We challenged her with oral cephalexin which she tolerated and we switched her over to cefazolin to treat her prosthetic joint infection.  Dina's pain has improved dramatically since she was in the hospital she is frustrated though that she still experiences hip pain when she sits down and also when she gets up to pivot and that she feels she is too dependent on her walker compared to when she fractured a bone more than a year ago.  She has completed her cefazolin and is now on cefadroxil.  Dr. [name] has met with the patient in January and informed her that she did not find indication for further chemotherapy for the patient provided here locally in terms of palliative chemotherapy.  She did recommend if the patient wanted further treatment that she seek out treatment at a institution.    Cussing this with her today she walked into have to travel a great distance for example to go to [facility] or [facility] particular with issues with parking and driving.  I do not know if a telehealth visit could be 1 component of her care from an academic institution and a second opinion    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[place]) 02/29/2016   skin- 2016- squamous- on right upper arm   Chronic kidney disease    Closed displaced fracture of right femoral neck ([place]) 11/07/2021   Deficiency  anemia 11/02/2020   Diabetes mellitus without complication (HCC)    GERD (gastroesophageal reflux disease)    Gout    takes Allopurinol daily   History of chemotherapy    History of colon polyps    benign   History of shingles    Hx of radiation therapy    Hyperlipidemia    Hypertension    Ovarian ca ([place]) dx'd 03/2020   Personal history of chemotherapy    2017   Personal history of radiation therapy    2017   Vitamin D deficiency     Past Surgical History:  Procedure Laterality Date   ABDOMINAL HYSTERECTOMY  1973   BREAST BIOPSY Left 02/26/2016   BREAST LUMPECTOMY Left 03/18/2016   CARDIAC CATHETERIZATION     patient denies this procedure   CATARACT EXTRACTION, BILATERAL  2014   right eye 2/14; left eye 3/3   COLONOSCOPY     HAND SURGERY Left 2012   HIP ARTHROPLASTY Right 11/08/2021   Procedure: ARTHROPLASTY BIPOLAR HIP (HEMIARTHROPLASTY) POSTERIOR;  Surgeon: Willaim Sheng, MD;  Location: [facility];  Service: Orthopedics;  Laterality: Right;   INCONTINENCE SURGERY  2006   IR IMAGING GUIDED PORT INSERTION  04/24/2020   IR PARACENTESIS  05/08/2020   IR PARACENTESIS  05/16/2020   IR PARACENTESIS  05/25/2020   KNEE ARTHROSCOPY Right    MASTOPEXY Right 06/16/2017   Procedure: RIGHT BREAST MASTOPEXY;  Surgeon: Irene Limbo, MD;  Location: [facility];  Service: Plastics;  Laterality: Right;   PORT-A-CATH REMOVAL N/A 11/27/2016   Procedure: REMOVAL PORT-A-CATH;  Surgeon: Fanny Skates, MD;  Location: [facility];  Service: General;  Laterality: N/A;   PORTACATH PLACEMENT N/A 04/14/2016   Procedure: INSERTION PORT-A-CATH ;  Surgeon: Fanny Skates, MD;  Location: [facility];  Service: General;  Laterality: N/A;   RADIOACTIVE SEED GUIDED PARTIAL MASTECTOMY WITH AXILLARY SENTINEL LYMPH NODE BIOPSY Left 03/18/2016   Procedure: RADIOACTIVE SEED GUIDED LEFT PARTIAL MASTECTOMY WITH AXILLARY SENTINEL LYMPH NODE BIOPSY AND BLUE DYE INJECTION;  Surgeon: Fanny Skates, MD;  Location:  [facility];  Service: General;  Laterality: Left;   REDUCTION MAMMAPLASTY Right 2018   TOTAL HIP ARTHROPLASTY Right 08/29/2022   Procedure: incision and drainage hip head exchange;  Surgeon: Willaim Sheng, MD;  Location: [facility];  Service: Orthopedics;  Laterality: Right;    Family History  Problem Relation Age of Onset   Stroke Mother 38   Other Mother        history of hysterectomy after last childbirth   Diabetes Father    Alzheimer's disease Father    Hypertension Sister    Lung cancer Sister 25       smoker   Other Sister        history of hysterectomy for fibroids   Breast cancer Sister 53   Cirrhosis Sister    Stroke Maternal Grandmother    Heart Problems Maternal Grandmother    Esophageal cancer Maternal Aunt 74       not a smoker   Parkinson's disease Maternal Aunt    Heart attack Maternal Uncle 65   Pancreatic cancer Cousin 80       paternal 1st cousin   Lung cancer Other        nephew dx. 83s; +smoker   Epilepsy Other    Other Other 12       great niece dx. benign ganglioglioma brain tumor; treated at [facility]   Epilepsy Other        no seizures in 2 years   Colon cancer Neg Hx    Stomach cancer Neg Hx    Colon polyps Neg Hx    Ulcerative colitis Neg Hx       Social History   Socioeconomic History   Marital status: Married    Spouse name: [name]   Number of children: 0   Years of education: Not on file   Highest education level: Not on file  Occupational History   Not on file  Tobacco Use   Smoking status: Never   Smokeless tobacco: Never  Vaping Use   Vaping Use: Never used  Substance and Sexual Activity   Alcohol use: No   Drug use: No   Sexual activity: Not Currently    Birth control/protection: Surgical, Post-menopausal  Other Topics Concern   Not on file  Social History Narrative   Not on file   Social Determinants of Health   Financial Resource Strain: Not on file  Food Insecurity: No Food Insecurity (08/24/2022)   Hunger Vital Sign     Worried About Running Out of Food in the Last Year: Never true    Ran Out of Food in the Last Year: Never true  Transportation Needs: No Transportation Needs (08/24/2022)   PRAPARE - Hydrologist (Medical): No    Lack of Transportation (Non-Medical): No  Physical Activity: Not on file  Stress: Not on file  Social Connections: Not on file    Allergies  Allergen Reactions   Mobic [Meloxicam] Swelling   Prednisone Swelling and Other (See Comments)    Can Not take High doses   Premarin [Estrogens Conjugated] Hives   Trovan [Alatrofloxacin] Other (See Comments)    "I don't tolerate this well"     Current Outpatient Medications:    acetaminophen (TYLENOL) 500 MG tablet, Take 1,000 mg by mouth every 6 (six) hours as needed for mild pain, headache or fever., Disp: , Rfl:    allopurinol (ZYLOPRIM) 100 MG tablet, Take 1 tablet (100 mg total) by mouth daily. FOR GOUT PREVENTION, (Patient taking differently: Take 100 mg by mouth in the morning.), Disp: 90 tablet, Rfl: 3   aspirin EC 81 MG tablet, Take 1 tablet (81 mg total) by mouth 2 (two) times daily. Swallow whole., Disp: 30 tablet, Rfl: 12   cefadroxil (DURICEF) 500 MG capsule, Take 2 capsules (1,000 mg total) by mouth 2 (two) times daily., Disp: 120 capsule, Rfl: 11   Cholecalciferol (VITAMIN D3) 5000 units CAPS, Take 5,000 Units by mouth daily., Disp: , Rfl:    cyanocobalamin 1000 MCG tablet, Take 1,000 mcg by mouth daily., Disp: , Rfl:    Ensure Max Protein (ENSURE MAX PROTEIN) LIQD, Take 330 mLs (11 oz total) by mouth 2 (two) times daily., Disp: , Rfl:    ezetimibe (ZETIA) 10 MG tablet, TAKE 1 TABLET BY MOUTH DAILY FOR CHOLESTEROL (Patient taking differently: Take 10 mg by mouth daily.), Disp: 90 tablet, Rfl: 3   gabapentin (NEURONTIN) 300 MG capsule, Take 1 capsule 4 x  /day for Pain (Patient taking differently: Take 300 mg by mouth 4 (four) times daily.), Disp: 360 capsule, Rfl: 3   glimepiride (AMARYL) 1 MG  tablet, Take 1 tab by mouth in the morning if fasting is running 150+. Please do not take this medication if you are ill or not eating as this can cause a low blood sugar., Disp: 90 tablet, Rfl: 1   glucose blood test strip, Use as instructed, Disp: 100 each, Rfl: 12   hydrochlorothiazide (MICROZIDE) 12.5 MG capsule, Take 1 capsule (12.5 mg total) by mouth daily., Disp: 30 capsule, Rfl: 0   Lancets (FREESTYLE) lancets, USE TO CHECK BLOOD GLUCOSE ONCE DAILY AS DIRECTED, Disp: 100 each, Rfl: 3   lidocaine-prilocaine (EMLA) cream, Apply 1 application. topically daily as needed (for port access)., Disp: 30 g, Rfl: 3   magnesium oxide (MAG-OX) 400 (240 Mg) MG tablet, TAKE 1 TABLET(400 MG) BY MOUTH DAILY, Disp: 30 tablet, Rfl: 1   metoprolol succinate (TOPROL-XL) 25 MG 24 hr tablet, Take  1 tablet  Daily  for BP                                                          /                                       TAKE                            BY  MOUTH (Patient taking differently: Take 25 mg by mouth in the morning.), Disp: 90 tablet, Rfl: 3   ondansetron (ZOFRAN) 8 MG tablet, Take 1 tablet (8 mg total) by mouth every 8 (eight) hours as needed for refractory nausea / vomiting., Disp: 30 tablet, Rfl: 1   oxyCODONE (OXY IR/ROXICODONE) 5 MG immediate release tablet, Take 1 tablet (5 mg total) by mouth every 6 (six) hours as needed for severe pain or moderate pain., Disp: 30 tablet, Rfl: 0   pantoprazole (PROTONIX) 40 MG tablet, TAKE 1 TABLET(40 MG) BY MOUTH DAILY (Patient taking differently: Take 40 mg by mouth daily before breakfast.), Disp: 90 tablet, Rfl: 0   polyethylene glycol (MIRALAX) 17 g packet, Take 17 g by mouth 2 (two) times daily. 17 grams in 6 oz of favorite drink twice a day until bowel movement.  LAXITIVE.  Restart if two days since last bowel movement (Patient taking differently: Take 17 g by mouth daily as needed for mild constipation.), Disp: 14 packet, Rfl: 0    rosuvastatin (CRESTOR) 5 MG tablet, TAKE 1 TABLET BY MOUTH IN THE EVENING THREE TIMES WEEKLY FOR CHOLESTEROL (Patient taking differently: Take 5 mg by mouth every Monday, Wednesday, and Friday.), Disp: 36 tablet, Rfl: 3   trolamine salicylate (ASPERCREME) 10 % cream, Apply 1 application  topically as needed (to both feet for neuropathic pain)., Disp: , Rfl:    vitamin C (ASCORBIC ACID) 500 MG tablet, Take 500 mg by mouth every evening. , Disp: , Rfl:    Review of Systems     Objective:   Physical Exam Constitutional:      General: She is not in acute distress.    Appearance: Normal appearance. She is well-developed. She is not ill-appearing or diaphoretic.  HENT:     Head: Normocephalic and atraumatic.     Right Ear: Hearing and external ear normal.     Left Ear: Hearing and external ear normal.     Nose: No nasal deformity or rhinorrhea.  Eyes:     General: No scleral icterus.    Conjunctiva/sclera: Conjunctivae normal.     Right eye: Right conjunctiva is not injected.     Left eye: Left conjunctiva is not injected.     Pupils: Pupils are equal, round, and reactive to light.  Neck:     Vascular: No JVD.  Cardiovascular:     Rate and Rhythm: Normal rate and regular rhythm.     Heart sounds: S1 normal and S2 normal.  Pulmonary:     Effort: Pulmonary effort is normal. No respiratory distress.  Abdominal:     General: There is distension.     Palpations: Abdomen is soft.  Musculoskeletal:     Right shoulder: Normal.     Left shoulder: Normal.     Cervical back: Normal range of motion and neck supple.     Right hip: Normal.     Left hip: Normal.     Right knee: Normal.     Left knee: Normal.  Lymphadenopathy:     Head:     Right side of head: No submandibular, preauricular or posterior auricular adenopathy.     Left side of head: No submandibular, preauricular or posterior auricular adenopathy.     Cervical: No cervical adenopathy.     Right cervical: No superficial or deep  cervical adenopathy.    Left cervical: No superficial or deep cervical adenopathy.  Skin:    General: Skin is warm and dry.  Coloration: Skin is not pale.     Findings: No abrasion, bruising, ecchymosis, erythema, lesion or rash.     Nails: There is no clubbing.  Neurological:     General: No focal deficit present.     Mental Status: She is alert and oriented to person, place, and time.     Sensory: No sensory deficit.     Coordination: Coordination normal.     Gait: Gait normal.  Psychiatric:        Attention and Perception: She is attentive.        Mood and Affect: Mood normal.        Speech: Speech normal.        Behavior: Behavior normal. Behavior is cooperative.        Thought Content: Thought content normal.        Judgment: Judgment normal.          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   Metastatic ovarian cancer: Encouraged her to seek a second opinion and see if potentially local oncologist can recommend someone and whether or not a second opinion at least in terms of the discourse could not be accomplished through a telehealth visit.  Palliative care: Encouraged the patient to continue to work with hospice.  She was saying that may be others need them more than she does.  I spent 42 minutes with the patient including than 50% of the time in face to face counseling of the patient prosthetic joint infection or metastatic ovarian cancer prior pancytopenia,along with review of medical records in preparation for the visit and during the visit and in coordination of her care.

## 2023-02-04 ENCOUNTER — Other Ambulatory Visit: Payer: Self-pay | Admitting: Hematology and Oncology

## 2023-02-25 ENCOUNTER — Encounter: Payer: PPO | Admitting: Nurse Practitioner

## 2023-02-25 NOTE — Progress Notes (Deleted)
CPE  Assessment:   Annual CPE Due annually Health Maintenance reviewed  Essential hypertension Discussed DASH (Dietary Approaches to Stop Hypertension) DASH diet is lower in sodium than a typical American diet. Cut back on foods that are high in saturated fat, cholesterol, and trans fats. Eat more whole-grain foods, fish, poultry, and nuts Remain active and exercise as tolerated daily.  Monitor BP at home-Call if greater than 130/80.    Aortic atherosclerosis ([place]) by Abd CT scan on 05/10/2021 Discussed lifestyle modifications. Recommended diet heavy in fruits and veggies, omega 3's. Decrease consumption of animal meats, cheeses, and dairy products. Remain active and exercise as tolerated. Continue to monitor.  Nodule of lower lobe of right lung Last f/u with Hematology Dr. [name] 06/13/22 with progressive ovarian cancer. Last CT completed 04/23/2020 Impression:  There are multiple small subpleural nodules at the right lung base overlying the diaphragm, measuring up to 7 mm. These are generally nonspecific and less favored to represent pulmonary metastatic disease given distribution. Attention on follow-up.  Poor dentition Continue to follow with Dentist.  CKD stage 3 due to type 2 diabetes mellitus ([place]) Discussed how what you eat and drink can aide in kidney protection. Stay well hydrated. Avoid high salt foods. Avoid NSAIDS. Keep BP and BG well controlled.   Take medications as prescribed. Remain active and exercise as tolerated daily. Maintain weight.  Continue to monitor. Education: Reviewed 'ABCs' of diabetes management  A1C (<7):  Goal Met. Blood pressure (<130/80):  Goal Met Cholesterol (LDL <70):  Goal Met Continue Eye Exam yearly  Continue Dental Exam Q6 mo Discussed dietary recommendations Discussed Physical Activity recommendations Foot exam UTD  Malignant neoplasm of ovary, unspecified laterality [facility]) Last f/u with Hematology Dr. [name]  06/13/22 Disease progression was reviewed in length She is not too symptomatic. Moving forward, supportive of disease progression Dr. [name] is reluctant to recommend her to continue on current regimen. She has pr-existing severe peripheral neuropathy and risks of moving forward could exacerbate this as well as cardiomyopathy for any exposure to Adriamycin.  With topotecan, there is a risk for severe pancytopenia which increase r/f infection and reduced QOL. Recommendation to consider second opinion at [facility]. She plans to reach out to Dr. Berline Lopes or Dr. Denman George   Hyperlipidemia associated with type 2 diabetes mellitus ([place]) Discussed lifestyle modifications. Recommended diet heavy in fruits and veggies, omega 3's. Decrease consumption of animal meats, cheeses, and dairy products. Remain active and exercise as tolerated. Continue to monitor.  Mild nonproliferative diabetic retinopathy of both eyes without macular edema associated with type 2 diabetes mellitus ([place]) Continue to follow with recommended eye exams Continue to monitor  Chemotherapy-induced peripheral neuropathy ([place]) Continue current medications as directed. Continue to monitor  Osteopenia, unspecified location Last DEXA 10/2021. Recommend f/u 2024. Increase exercise, high calcium diet, continue vitamin D suppement  Adhesive capsulitis of right shoulder RICE method when flared. Pain  management PRN as directed. Continue to monitor  Pancytopenia, acquired ([place]) Hem/onc following closely; Check CBC  Vitamin D deficiency Continue supplement. Continue to monitor  Acute idiopathic gout, unspecified site No recent flare. Discussed avoiding high purine diet. Continue to monitor  History of left breast cancer Continue to follow with Hem/Onc.  Medication management All medications discussed and reviewed in full. All questions and concerns regarding medications addressed.      Over 30 minutes of exam, counseling,  chart review, and critical decision making was performed Future Appointments  Date Time Provider [place]  02/25/2023 10:00 AM  Darrol Jump, NP [facility] None  03/31/2023 11:00 AM Tommy Medal, Lavell Islam, MD [facility]  05/29/2023 11:00 AM Darrol Jump, NP [facility] None  02/25/2024 10:00 AM Darrol Jump, NP [facility] None    Plan:   During the course of the visit the patient was educated and counseled about appropriate screening and preventive services including:   Pneumococcal vaccine  Influenza vaccine Td vaccine Prevnar 13 Screening electrocardiogram Screening mammography Bone densitometry screening Colorectal cancer screening Diabetes screening Glaucoma screening Nutrition counseling  Advanced directives: given info/requested copies    Subjective:   Kayla Baker is a 87 y.o. female who presents for AWV and follow up. She has Essential hypertension; Hyperlipidemia associated with type 2 diabetes mellitus ([place]); DM2 (diabetes mellitus, type 2) ([place]); Vitamin D deficiency; Medication management; History of left breast cancer; Chemotherapy-induced peripheral neuropathy ([place]); Port catheter in place; CKD stage 3 due to type 2 diabetes mellitus ([place]); Bilateral lower extremity edema; Ovarian cancer ([place]); Anemia in neoplastic disease; Other ascites; Pancytopenia, acquired ([place]); Cancer associated pain; Mild nonproliferative diabetic retinopathy of both eyes without macular edema associated with type 2 diabetes mellitus ([place]); Osteopenia; Aortic atherosclerosis ([place]) by Abd CT scan on 05/10/2021; Nodule of lower lobe of right lung; Poor dentition; Adhesive capsulitis of right shoulder; Postoperative breast asymmetry; Hypomagnesemia; Stage 3b chronic kidney disease (CKD) ([place]); Neutropenia ([place]); Generalized weakness; Acute pancreatitis; History of therapeutic radiation; Hypokalemia; Hyperbilirubinemia; Normocytic anemia; Thrombocytopenia ([place]); Protein-calorie  malnutrition, severe ([place]); Right hip pain; Urinary tract infection without hematuria; Pancytopenia ([place]); Malnutrition of moderate degree; Prosthetic hip infection ([place]); Allergy to cephalosporin; Palliative care encounter; Sepsis ([place]); Enteritis; Need for emotional support; and MSSA (methicillin susceptible Staphylococcus aureus) infection on their problem list.  She is married, no children. 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 She enjoys spending time with family.   She was seen in ER on 01/08/23 for painless bilateral blurry vision x 1 week.  CT Head wo Contrast showed no acute intracranial fidnings.  She remained on constatnt cardiac monitoring NSR with normal HR.  Thought to have multiple underlying etiologies including ophthalmologic disease, med SE.  She was offered a transfer to [facility] for MRI brain w/contrast to r/o neurologic cause but refused.  Labs showed mild pancytopenia.  UA was negative for UTI.    In 2021 she was unfortunately diagnosed with large R sided ovarian tumor with consequent RLE edema, urinary incontinence (wearing depends, improved), had port cath placed and started chemotherapy managed by Dr. Heath Lark, s/p resction by Dr. Everitt Amber at [facility] on 10/18/2020.  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  She spoke with Dr. [name] today and plans to reach out to Dr. Berline Lopes or Dr. Denman George to move forward with other treatment options.  She does currently have options provided to her by Dr. [name] including Bevacizumab injection, Topotecan capsules, Doxorubicin Liposomal injection, or Docetaxel injection. She is currently trying to understand these medications along with the SE. Patient states she is not ready to give up. Overall she reports she feels well and in good spirits.   Other significant current problems include hx/o Triple (+) Lt Breast Ca w/ Partial Lt Mastectomy  in April 2017 followed by Chemo tx 5-07/2016 then  Radiation in Oct; she completed 4 years of anastrozole, was d/c'd due to ovarian cancer.   [name]   BMI is There is no height or weight on file to calculate BMI., she  has been working on diet, pushing fruits/vegetables, watching white carbs; she does have [employer] membership but hasn't been going since the pandemic. She is very active with housework and yardwork.  Wt Readings from Last 3 Encounters:  01/29/23 136 lb 14.4 oz (62.1 kg)  01/08/23 135 lb (61.2 kg)  12/08/22 154 lb (69.9 kg)   Her blood pressure has been controlled at home, today their BP is   She does not workout. She denies chest pain, shortness of breath, dizziness.   She has aortic atherosclerosis per CT 05/2021.   She is on cholesterol medication (on zetia, hx of LFT elevation with statin, was on rosuvastatin 5 mg three days a week but holding while on chemotherapy per oncology) and denies myalgias. Her cholesterol at goal. The cholesterol last visit was:   Lab Results  Component Value Date   CHOL 117 02/21/2022   HDL 45 (L) 02/21/2022   LDLCALC 53 02/21/2022   TRIG 102 02/21/2022   CHOLHDL 2.6 02/21/2022   She has been working on diet and exercise for T2 diabetes (off of metformin due to CKD, glimepiride 1 mg PRN AM if fasting 150+), and denies foot ulcerations, hyperglycemia, hypoglycemia , increased appetite, nausea, polydipsia, polyuria, visual disturbances, vomiting and weight loss.She reports that her blood sugars have been running 91-121. She does have some mild neuropathy in her feet, on gabapentin.    Last A1C in the office was:  Lab Results  Component Value Date   HGBA1C 6.2 (H) 06/16/2022   CKD III associated with T2DM; followed by Dr. Hollie Salk; last GFR:  Lab Results  Component Value Date   GFRNONAA 41 (L) 01/08/2023   GFRNONAA 44 (L) 11/10/2022   GFRNONAA 43 (L) 11/09/2022   Patient is on Vitamin D supplement, taking 5000 IU daily. Lab Results  Component  Value Date   VD25OH 52 02/21/2022     Patient is on allopurinol for gout and does not report a recent flare.  Lab Results  Component Value Date   LABURIC 6.4 08/05/2022   Hem/onc and nephrology also following chronic anemia closely; had normal iron 10/2020, B12 05/2020.     Latest Ref Rng & Units 01/08/2023    5:50 PM 11/20/2022    4:02 PM 11/10/2022    5:00 AM  CBC  WBC 4.0 - 10.5 K/uL 3.0  6.1  19.9   Hemoglobin 12.0 - 15.0 g/dL 11.1  9.7  7.7   Hematocrit 36.0 - 46.0 % 33.2  29.7  23.4   Platelets 150 - 400 K/uL 89  132  65      Medication Review  Current Outpatient Medications (Endocrine & Metabolic):    glimepiride (AMARYL) 1 MG tablet, Take 1 tab by mouth in the morning if fasting is running 150+. Please do not take this medication if you are ill or not eating as this can cause a low blood sugar.  Current Outpatient Medications (Cardiovascular):    ezetimibe (ZETIA) 10 MG tablet, TAKE 1 TABLET BY MOUTH DAILY FOR CHOLESTEROL (Patient taking differently: Take 10 mg by mouth daily.)   furosemide (LASIX) 20 MG tablet, Take by mouth.   hydrochlorothiazide (MICROZIDE) 12.5 MG capsule, Take 1 capsule (12.5 mg total) by mouth daily.   metoprolol succinate (TOPROL-XL) 25 MG 24 hr tablet, Take  1 tablet  Daily  for BP                                                          /  TAKE                            BY                                 MOUTH (Patient taking differently: Take 25 mg by mouth in the morning.)   rosuvastatin (CRESTOR) 5 MG tablet, TAKE 1 TABLET BY MOUTH IN THE EVENING THREE TIMES WEEKLY FOR CHOLESTEROL (Patient taking differently: Take 5 mg by mouth every Monday, Wednesday, and Friday.)   Current Outpatient Medications (Analgesics):    acetaminophen (TYLENOL) 500 MG tablet, Take 1,000 mg by mouth every 6 (six) hours as needed for mild pain, headache or fever.   allopurinol (ZYLOPRIM) 100 MG tablet, Take 1 tablet (100 mg total) by  mouth daily. FOR GOUT PREVENTION, (Patient taking differently: Take 100 mg by mouth in the morning.)   aspirin EC 81 MG tablet, Take 1 tablet (81 mg total) by mouth 2 (two) times daily. Swallow whole.   oxyCODONE (OXY IR/ROXICODONE) 5 MG immediate release tablet, Take 1 tablet (5 mg total) by mouth every 6 (six) hours as needed for severe pain or moderate pain.  Current Outpatient Medications (Hematological):    cyanocobalamin 1000 MCG tablet, Take 1,000 mcg by mouth daily.  Current Outpatient Medications (Other):    cefadroxil (DURICEF) 500 MG capsule, Take 2 capsules (1,000 mg total) by mouth 2 (two) times daily.   Cholecalciferol (VITAMIN D3) 5000 units CAPS, Take 5,000 Units by mouth daily.   Ensure Max Protein (ENSURE MAX PROTEIN) LIQD, Take 330 mLs (11 oz total) by mouth 2 (two) times daily.   gabapentin (NEURONTIN) 300 MG capsule, Take 1 capsule 4 x  /day for Pain (Patient taking differently: Take 300 mg by mouth 4 (four) times daily.)   glucose blood test strip, Use as instructed   Lancets (FREESTYLE) lancets, USE TO CHECK BLOOD GLUCOSE ONCE DAILY AS DIRECTED   lidocaine-prilocaine (EMLA) cream, Apply 1 application. topically daily as needed (for port access).   magnesium oxide (MAG-OX) 400 (240 Mg) MG tablet, TAKE 1 TABLET(400 MG) BY MOUTH DAILY   ondansetron (ZOFRAN) 8 MG tablet, Take 1 tablet (8 mg total) by mouth every 8 (eight) hours as needed for refractory nausea / vomiting.   pantoprazole (PROTONIX) 40 MG tablet, TAKE 1 TABLET(40 MG) BY MOUTH DAILY   polyethylene glycol (MIRALAX) 17 g packet, Take 17 g by mouth 2 (two) times daily. 17 grams in 6 oz of favorite drink twice a day until bowel movement.  LAXITIVE.  Restart if two days since last bowel movement (Patient taking differently: Take 17 g by mouth daily as needed for mild constipation.)   trolamine salicylate (ASPERCREME) 10 % cream, Apply 1 application  topically as needed (to both feet for neuropathic pain).   vitamin C  (ASCORBIC ACID) 500 MG tablet, Take 500 mg by mouth every evening.   Current Problems (verified) Patient Active Problem List   Diagnosis Date Noted   MSSA (methicillin susceptible Staphylococcus aureus) infection 01/29/2023   Need for emotional support 11/08/2022   Enteritis 11/07/2022   Sepsis ([place]) 11/06/2022   Palliative care encounter 10/08/2022   Allergy to cephalosporin    Malnutrition of moderate degree 08/28/2022   Prosthetic hip infection ([place]) 08/28/2022   Urinary tract infection without hematuria    Pancytopenia ([place])    Hypokalemia 08/24/2022   Hyperbilirubinemia 08/24/2022  Normocytic anemia 08/24/2022   Thrombocytopenia ([place]) 08/24/2022   Protein-calorie malnutrition, severe ([place]) 08/24/2022   Right hip pain 08/24/2022   Acute pancreatitis 08/09/2022   Stage 3b chronic kidney disease (CKD) ([place]) 08/05/2022   Neutropenia ([place]) 08/05/2022   Generalized weakness    Hypomagnesemia 02/22/2022   Poor dentition 07/26/2021   Nodule of lower lobe of right lung 06/27/2021   Aortic atherosclerosis ([place]) by Abd CT scan on 05/10/2021 06/10/2021   Mild nonproliferative diabetic retinopathy of both eyes without macular edema associated with type 2 diabetes mellitus ([place]) 02/20/2021   Osteopenia 02/20/2021   Pancytopenia, acquired ([place]) 05/15/2020   Cancer associated pain 05/15/2020   Other ascites 05/07/2020   Anemia in neoplastic disease 05/03/2020   Ovarian cancer ([place]) 04/19/2020   Bilateral lower extremity edema 04/05/2020   CKD stage 3 due to type 2 diabetes mellitus ([place]) 07/05/2018   Adhesive capsulitis of right shoulder 04/27/2018   Postoperative breast asymmetry 04/01/2017   History of therapeutic radiation 04/01/2017   Port catheter in place 09/18/2016   Chemotherapy-induced peripheral neuropathy ([place]) 08/07/2016   History of left breast cancer 02/29/2016   Medication management 08/27/2014   Essential hypertension 11/10/2013   Hyperlipidemia associated with  type 2 diabetes mellitus ([place]) 11/10/2013   DM2 (diabetes mellitus, type 2) ([place]) 11/10/2013   Vitamin D deficiency 11/10/2013    Screening Tests Immunization History  Administered Date(s) Administered   COVID-19, mRNA, vaccine([place])12 years and older 10/30/2022   DTaP 07/01/2004   Fluad Quad(high Dose 65+) 10/30/2022   Influenza Whole 08/15/2013   Influenza, High Dose Seasonal PF 08/28/2014, 09/20/2015, 08/19/2017, 09/01/2018, 07/28/2019   Influenza-Unspecified 08/04/2020, 08/15/2021   PFIZER(Purple Top)SARS-COV-2 Vaccination 12/12/2019, 12/31/2019, 08/03/2020, 06/11/2021   Pneumococcal Conjugate-13 09/20/2015   Pneumococcal Polysaccharide-23 11/01/2009   Td 08/10/2009, 04/11/2020   Tdap 11/29/2022   Zoster, Live 08/02/2011    Preventative care: Last colonoscopy: 2019 will not need another Last mammogram: Completed 03/2022 Last pap smear/pelvic exam: remotely DEXA: 10/16/2021 - Due 2024  Prior vaccinations: TD or Tdap: 03/2020 Influenza: 08/25/2021  Pneumococcal: 2010 Prevnar13: 2016  Shingles/Zostavax: 2012 discussed will get eventually, had first shingrix was advised not to get the second by oncologist  COVID complete 2021  Names of Other Physician/Practitioners you currently use: 1. [facility]- here for primary care 2. Dr. Nicki Reaper, eye doctor, last visit 09/18/2021 - bil mild retinopathy last year, report requested 3. Dr. [place] Sink, dentist, last visit 2022 -  plans to visit soon.  Patient Care Team: [name], MD as PCP - Kinnie Scales, [place] as Referring Physician (Optometry) [name], MD as Consulting Physician (Dermatology) Latanya Maudlin, MD as Consulting Physician (Orthopedic Surgery) Inda Castle, MD (Inactive) as Consulting Physician (Gastroenterology) Heath Lark, MD as Consulting Physician (Hematology and Oncology) Madelon Lips, MD as Consulting Physician (Nephrology) Carleene Mains, [facility]  (Pharmacist)  Allergies Allergies  Allergen Reactions   Mobic [Meloxicam] Swelling   Prednisone Swelling and Other (See Comments)    Can Not take High doses   Premarin [Estrogens Conjugated] Hives   Trovan [Alatrofloxacin] Other (See Comments)    "I don't tolerate this well"    SURGICAL HISTORY She  has a past surgical history that includes Cataract extraction, bilateral (2014); Abdominal hysterectomy (1973); Incontinence surgery (2006); Knee arthroscopy (Right); Hand surgery (Left, 2012); Colonoscopy; Radioactive seed guided mastectomy with axillary sentinel lymph node biopsy (Left, 03/18/2016); Cardiac catheterization; Portacath placement (N/A, 04/14/2016); Port-a-cath removal (N/A, 11/27/2016); Mastopexy (Right, 06/16/2017); Breast biopsy (Left,  02/26/2016); Breast lumpectomy (Left, 03/18/2016); Reduction mammaplasty (Right, 2018); IR IMAGING GUIDED PORT INSERTION (04/24/2020); IR Paracentesis (05/08/2020); IR Paracentesis (05/16/2020); IR Paracentesis (05/25/2020); Hip Arthroplasty (Right, 11/08/2021); and Total hip arthroplasty (Right, 08/29/2022).   FAMILY HISTORY Her family history includes Alzheimer's disease in her father; Breast cancer (age of onset: 58) in her sister; Cirrhosis in her sister; Diabetes in her father; Epilepsy in some other family members; Esophageal cancer (age of onset: 61) in her maternal aunt; Heart Problems in her maternal grandmother; Heart attack (age of onset: 32) in her maternal uncle; Hypertension in her sister; Lung cancer in an other family member; Lung cancer (age of onset: 103) in her sister; Other in her mother and sister; Other (age of onset: 15) in an other family member; Pancreatic cancer (age of onset: 35) in her cousin; Parkinson's disease in her maternal aunt; Stroke in her maternal grandmother; Stroke (age of onset: 32) in her mother.   SOCIAL HISTORY She  reports that she has never smoked. She has never used smokeless tobacco. She reports that she does not  drink alcohol and does not use drugs.  MEDICARE WELLNESS OBJECTIVES: Physical activity:   Cardiac risk factors:   Depression/mood screen:      01/29/2023   10:20 AM  Depression screen PHQ 2/9  Decreased Interest 0  Down, Depressed, Hopeless 0  PHQ - 2 Score 0    ADLs:     11/08/2022    2:00 PM 11/07/2022    7:00 PM  In your present state of health, do you have any difficulty performing the following activities:  Hearing?  0  Vision?  0  Difficulty concentrating or making decisions?  0  Walking or climbing stairs?  0  Dressing or bathing?  0  Doing errands, shopping? 0      Cognitive Testing  Alert? Yes  Normal Appearance?Yes  Oriented to person? Yes  Place? Yes   Time? Yes  Recall of three objects?  Yes  Can perform simple calculations? Yes  Displays appropriate judgment?Yes  Can read the correct time from a watch face?Yes  EOL planning:       Review of Systems  Constitutional:  Negative for malaise/fatigue and weight loss.  HENT:  Negative for hearing loss and tinnitus.   Eyes:  Negative for blurred vision and double vision.  Respiratory:  Negative for cough, sputum production, shortness of breath and wheezing.   Cardiovascular:  Negative for chest pain, palpitations, orthopnea, claudication, leg swelling and PND.  Gastrointestinal:  Negative for abdominal pain, blood in stool, constipation, diarrhea, heartburn, melena, nausea and vomiting.  Genitourinary: Negative.   Musculoskeletal:  Negative for falls, joint pain and myalgias.  Skin:  Negative for rash.  Neurological:  Positive for sensory change (bil feet/ankles, chronic following chemo). Negative for dizziness, tingling, weakness and headaches.  Endo/Heme/Allergies:  Negative for polydipsia.  Psychiatric/Behavioral: Negative.  Negative for depression, memory loss, substance abuse and suicidal ideas. The patient is not nervous/anxious and does not have insomnia.   All other systems reviewed and are  negative.    Objective:   There were no vitals filed for this visit.  There is no height or weight on file to calculate BMI.  General appearance: alert, no distress, WD/WN, elder female HEENT: normocephalic, sclerae anicteric, TMs pearly, nares patent, no discharge or erythema, pharynx normal Oral cavity: MMM, no lesions Neck: supple, no lymphadenopathy, no thyromegaly, no masses Heart: RRR, normal S1, S2, no murmurs Lungs: CTA bilaterally, no wheezes, rhonchi, or  rales Abdomen: +bs, soft, non tender, non distended, no masses, no hepatomegaly, no splenomegaly Musculoskeletal: nontender, no swelling, no obvious deformity Extremities: LLE without edema, RLE with scant swelling around ankle, no cyanosis, no clubbing Pulses: 2+ symmetric, upper and lower extremities, normal cap refill Neurological: alert, oriented x 3, CN2-12 intact, strength normal upper extremities and lower extremities, sensation normal UE, bil LE diminished through feet and ankles to monofilament and light touch, DTRs 2+ throughout, no cerebellar signs, gait slow steady Psychiatric: normal affect, behavior normal, pleasant  Gyn: defer to GYN/onc   Medicare Attestation I have personally reviewed: The patient's medical and social history Their use of alcohol, tobacco or illicit drugs Their current medications and supplements The patient's functional ability including ADLs,fall risks, home safety risks, cognitive, and hearing and visual impairment Diet and physical activities Evidence for depression or mood disorders  The patient's weight, height, BMI, and visual acuity have been recorded in the chart.  I have made referrals, counseling, and provided education to the patient based on review of the above and I have provided the patient with a written personalized care plan for preventive services.     Darrol Jump, NP   02/25/2023

## 2023-02-27 ENCOUNTER — Encounter: Payer: PPO | Admitting: Internal Medicine

## 2023-03-10 NOTE — Progress Notes (Signed)
FMLA Forms completed for Daughter as requested by Patient. Fax transmission confirmation received. Copy placed for pick-up as requested.

## 2023-03-16 ENCOUNTER — Encounter: Payer: Self-pay | Admitting: Hematology and Oncology

## 2023-03-16 ENCOUNTER — Ambulatory Visit (INDEPENDENT_AMBULATORY_CARE_PROVIDER_SITE_OTHER): Payer: PPO | Admitting: Nurse Practitioner

## 2023-03-16 ENCOUNTER — Encounter: Payer: Self-pay | Admitting: Nurse Practitioner

## 2023-03-16 VITALS — BP 112/60 | HR 82 | Temp 97.5°F | Ht 63.0 in | Wt 133.4 lb

## 2023-03-16 DIAGNOSIS — N1832 Chronic kidney disease, stage 3b: Secondary | ICD-10-CM | POA: Diagnosis not present

## 2023-03-16 DIAGNOSIS — I700 Atherosclerosis of aorta: Secondary | ICD-10-CM

## 2023-03-16 DIAGNOSIS — R790 Abnormal level of blood mineral: Secondary | ICD-10-CM | POA: Diagnosis not present

## 2023-03-16 DIAGNOSIS — G620 Drug-induced polyneuropathy: Secondary | ICD-10-CM

## 2023-03-16 DIAGNOSIS — K089 Disorder of teeth and supporting structures, unspecified: Secondary | ICD-10-CM

## 2023-03-16 DIAGNOSIS — M8580 Other specified disorders of bone density and structure, unspecified site: Secondary | ICD-10-CM

## 2023-03-16 DIAGNOSIS — Z0000 Encounter for general adult medical examination without abnormal findings: Secondary | ICD-10-CM | POA: Diagnosis not present

## 2023-03-16 DIAGNOSIS — E1122 Type 2 diabetes mellitus with diabetic chronic kidney disease: Secondary | ICD-10-CM

## 2023-03-16 DIAGNOSIS — M7501 Adhesive capsulitis of right shoulder: Secondary | ICD-10-CM

## 2023-03-16 DIAGNOSIS — I10 Essential (primary) hypertension: Secondary | ICD-10-CM

## 2023-03-16 DIAGNOSIS — R911 Solitary pulmonary nodule: Secondary | ICD-10-CM

## 2023-03-16 DIAGNOSIS — Z0001 Encounter for general adult medical examination with abnormal findings: Secondary | ICD-10-CM

## 2023-03-16 DIAGNOSIS — E785 Hyperlipidemia, unspecified: Secondary | ICD-10-CM | POA: Diagnosis not present

## 2023-03-16 DIAGNOSIS — E559 Vitamin D deficiency, unspecified: Secondary | ICD-10-CM

## 2023-03-16 DIAGNOSIS — E1169 Type 2 diabetes mellitus with other specified complication: Secondary | ICD-10-CM

## 2023-03-16 DIAGNOSIS — E113293 Type 2 diabetes mellitus with mild nonproliferative diabetic retinopathy without macular edema, bilateral: Secondary | ICD-10-CM

## 2023-03-16 DIAGNOSIS — Z79899 Other long term (current) drug therapy: Secondary | ICD-10-CM

## 2023-03-16 DIAGNOSIS — D61818 Other pancytopenia: Secondary | ICD-10-CM

## 2023-03-16 DIAGNOSIS — Z853 Personal history of malignant neoplasm of breast: Secondary | ICD-10-CM

## 2023-03-16 DIAGNOSIS — N183 Chronic kidney disease, stage 3 unspecified: Secondary | ICD-10-CM | POA: Diagnosis not present

## 2023-03-16 NOTE — Progress Notes (Signed)
CPE  Assessment:   Kayla Baker was see in office today for annual CPE.  Encounter for annual exam Due annually Health Maintenance reviewed  Essential hypertension Discussed DASH (Dietary Approaches to Stop Hypertension) DASH diet is lower in sodium than a typical American diet. Cut back on foods that are high in saturated fat, cholesterol, and trans fats. Eat more whole-grain foods, fish, poultry, and nuts Remain active and exercise as tolerated daily.  Monitor BP at home-Call if greater than 130/80.    Aortic atherosclerosis (HCC) by Abd CT scan on 05/10/2021 Discussed lifestyle modifications. Recommended diet heavy in fruits and veggies, omega 3's. Decrease consumption of animal meats, cheeses, and dairy products. Remain active and exercise as tolerated. Continue to monitor.   Nodule of lower lobe of right lung Last f/u with Hematology Dr. Bertis Ruddy 06/13/22 with progressive ovarian cancer. Last CT completed 04/23/2020 Impression:  There are multiple small subpleural nodules at the right lung base overlying the diaphragm, measuring up to 7 mm. These are generally nonspecific and less favored to represent pulmonary metastatic disease given distribution. Attention on follow-up.  Poor dentition Continue to follow with Dentist.  CKD stage 3 due to type 2 diabetes mellitus (HCC) Discussed how what you eat and drink can aide in kidney protection. Stay well hydrated. Avoid high salt foods. Avoid NSAIDS. Keep BP and BG well controlled.   Take medications as prescribed. Remain active and exercise as tolerated daily. Maintain weight.  Continue to monitor. Education: Reviewed 'ABCs' of diabetes management  A1C (<7):  Goal Met. Blood pressure (<130/80):  Goal Met Cholesterol (LDL <70):  Goal Met Continue Eye Exam yearly  Continue Dental Exam Q6 mo Discussed dietary recommendations Discussed Physical Activity recommendations Foot exam UTD  Malignant neoplasm of ovary,  unspecified laterality [facility]) Last f/u with Hematology Dr. Bertis Ruddy 06/13/22 Disease progression was reviewed in length She is not too symptomatic. Moving forward, supportive of disease progression Dr. Bertis Ruddy is reluctant to recommend her to continue on current regimen. She has pr-existing severe peripheral neuropathy and risks of moving forward could exacerbate this as well as cardiomyopathy for any exposure to Adriamycin.  With topotecan, there is a risk for severe pancytopenia which increase r/f infection and reduced QOL.  Hyperlipidemia associated with type 2 diabetes mellitus (HCC) Discussed lifestyle modifications. Recommended diet heavy in fruits and veggies, omega 3's. Decrease consumption of animal meats, cheeses, and dairy products. Remain active and exercise as tolerated. Continue to monitor.  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 Continue to monitor  Osteopenia, unspecified location Last DEXA 10/2021. Recommend f/u 2024. Increase exercise, high calcium diet, continue vitamin D suppement  Pancytopenia, acquired (HCC) Hem/onc following closely; Check CBC  Vitamin D deficiency Continue supplement. Continue to monitor  History of left breast cancer Continue to follow with Hem/Onc.  Medication management All medications discussed and reviewed in full. All questions and concerns regarding medications addressed.    Orders Placed This Encounter  Procedures   MICROSCOPIC MESSAGE   CBC with Differential/Platelet   COMPLETE METABOLIC PANEL WITH GFR   Lipid panel   Hemoglobin A1c   VITAMIN D 25 Hydroxy (Vit-D Deficiency, Fractures)   Urinalysis, Routine w reflex microscopic   Microalbumin / creatinine urine ratio   Magnesium     Notify office for further evaluation and treatment, questions or concerns  if any reported s/s fail to improve.   The patient was advised to call back or seek an in-person evaluation if any symptoms worsen or if the condition fails to improve as anticipated.   Further disposition pending results of labs. Discussed med's effects and SE's.    I discussed the assessment and treatment plan with the patient. The patient was provided an opportunity to ask questions and all were answered. The patient agreed with the plan and demonstrated an understanding of the instructions.  Discussed med's effects and SE's. Screening labs and tests as requested with regular follow-up as recommended.  I provided 35 minutes of face-to-face time during this encounter including counseling, chart review, and critical decision making was preformed.  Today's Plan of Care is based on a patient-centered health care approach known as shared decision making - the decisions, tests and treatments allow for patient preferences and values to be balanced with clinical evidence.    Future Appointments  Date Time Provider Department Center  03/31/2023 11:00 AM Daiva Eves, Lisette Grinder, MD [facility]  05/29/2023 11:00 AM Adela Glimpse, NP [facility] None  03/15/2024  2:00 PM Adela Glimpse, NP [facility] None   Subjective:   Kayla Baker is a 87 y.o. female who presents for annual CPE. She has Essential hypertension; Hyperlipidemia associated with type 2 diabetes mellitus; DM2 (diabetes mellitus, type 2); Vitamin D deficiency; Medication management; History of left breast cancer; Chemotherapy-induced peripheral neuropathy; Port catheter in place; CKD stage 3 due to type 2 diabetes mellitus; Bilateral lower extremity edema; Ovarian cancer; Anemia in neoplastic disease; Other ascites; Pancytopenia, acquired; Cancer associated pain; Mild nonproliferative diabetic retinopathy of both eyes without macular edema associated with type 2  diabetes mellitus; Osteopenia; Aortic atherosclerosis (HCC) by Abd CT scan on 05/10/2021; Nodule of lower lobe of right lung; Poor dentition; Adhesive capsulitis of right shoulder; Postoperative breast asymmetry; Hypomagnesemia; Stage 3b chronic kidney disease (CKD); Neutropenia; Generalized weakness; Acute pancreatitis; History of therapeutic radiation; Hypokalemia; Hyperbilirubinemia; Normocytic anemia; Thrombocytopenia; Protein-calorie malnutrition, severe; Right hip pain; Urinary tract infection without hematuria; Pancytopenia; Malnutrition of moderate degree; Prosthetic hip infection; Allergy to cephalosporin; Palliative care encounter; Sepsis; Enteritis; Need for emotional support; and MSSA (methicillin susceptible Staphylococcus aureus) infection on their problem list.  She is married, presents with husband.  In 2021 she was unfortunately diagnosed with large R sided ovarian tumor with consequent RLE edema, urinary incontinence (wearing depends, improved), had port cath placed and started chemotherapy managed by Dr. Artis Delay, s/p resction by Dr. Adolphus Birchwood at [facility] on 10/18/2020.  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  She spoke with Dr. Bertis Ruddy today and plans to reach out to Dr. Pricilla Holm or Dr. Andrey Farmer to move forward with other treatment options.  She does currently have options provided to her by Dr. Bertis Ruddy including Bevacizumab injection, Topotecan capsules, Doxorubicin Liposomal injection, or Docetaxel injection. She is currently trying to understand these medications along with the SE. Patient states she is not ready to give up. Overall she reports she feels well and in good spirits.   Other significant current problems include hx/o Triple (+) Lt Breast Ca w/ Partial Lt Mastectomy  in April 2017 followed by Chemo tx 5-07/2016 then Radiation in Oct; she completed 4 years of anastrozole, was d/c'd due to ovarian cancer.   Patient also has gout and GERD well controlled by  medications.   BMI is Body mass index is 23.63 kg/m., she has been working on diet. Wt Readings from Last 3 Encounters:  03/16/23 133 lb 6.4 oz (60.5 kg)  01/29/23 136 lb 14.4 oz (62.1 kg)  01/08/23 135 lb (61.2 kg)   Her blood pressure has been controlled at home, today their BP is BP: 112/60 She does not workout. She denies chest pain, shortness of breath, dizziness.   She has aortic atherosclerosis per CT 05/2021.   She is on cholesterol medication (on zetia, hx of LFT elevation with statin, was on rosuvastatin 5 mg three days a week but holding while on chemotherapy per oncology) and denies myalgias. Her cholesterol at goal. The cholesterol last visit was:   Lab Results  Component Value Date   CHOL 117 02/21/2022   HDL 45 (L) 02/21/2022   LDLCALC 53 02/21/2022   TRIG 102 02/21/2022   CHOLHDL 2.6 02/21/2022   She has been working on diet and exercise for T2 diabetes. She does have some mild neuropathy in her feet, on gabapentin.    Last A1C in the office was:  Lab Results  Component Value Date   HGBA1C 6.2 (H) 06/16/2022   CKD III associated with T2DM; followed by Dr. Signe Colt; last GFR:  Lab Results  Component Value Date   GFRNONAA 41 (L) 01/08/2023   GFRNONAA 44 (L) 11/10/2022   GFRNONAA 43 (L) 11/09/2022   Patient is on Vitamin D supplement, taking 5000 IU daily. Lab Results  Component Value Date   VD25OH 43 02/21/2022     Patient is on allopurinol for gout and does not report a recent flare.  Lab Results  Component Value Date   LABURIC 6.4 08/05/2022   Hem/onc and nephrology also following chronic anemia closely; had normal iron 10/2020, B12 05/2020.     Latest Ref Rng & Units 01/08/2023    5:50 PM 11/20/2022    4:02 PM 11/10/2022    5:00 AM  CBC  WBC 4.0 - 10.5 K/uL 3.0  6.1  19.9   Hemoglobin 12.0 - 15.0 g/dL 34.7  9.7  7.7   Hematocrit 36.0 - 46.0 % 33.2  29.7  23.4   Platelets 150 - 400 K/uL 89  132  65      Medication Review  Current Outpatient  Medications (Endocrine & Metabolic):    glimepiride (AMARYL) 1 MG tablet, Take 1 tab by mouth in the morning if fasting is running 150+. Please do not take this medication if you are ill or not eating as this can cause a low blood sugar.  Current Outpatient Medications (Cardiovascular):    ezetimibe (ZETIA) 10 MG tablet, TAKE 1 TABLET BY MOUTH DAILY FOR CHOLESTEROL (Patient taking differently: Take 10 mg by mouth daily.)   furosemide (LASIX) 20 MG tablet, Take by mouth.   metoprolol succinate (TOPROL-XL) 25 MG 24 hr tablet, Take  1 tablet  Daily  for BP                                                          /                                       TAKE  BY                                 MOUTH (Patient taking differently: Take 25 mg by mouth in the morning.)   rosuvastatin (CRESTOR) 5 MG tablet, TAKE 1 TABLET BY MOUTH IN THE EVENING THREE TIMES WEEKLY FOR CHOLESTEROL (Patient taking differently: Take 5 mg by mouth every Monday, Wednesday, and Friday.)   hydrochlorothiazide (MICROZIDE) 12.5 MG capsule, Take 1 capsule (12.5 mg total) by mouth daily.  Current Outpatient Medications (Respiratory):    loratadine (CLARITIN) 10 MG tablet, Take by mouth.  Current Outpatient Medications (Analgesics):    acetaminophen (TYLENOL) 500 MG tablet, Take 1,000 mg by mouth every 6 (six) hours as needed for mild pain, headache or fever.   oxyCODONE (OXY IR/ROXICODONE) 5 MG immediate release tablet, Take 1 tablet (5 mg total) by mouth every 6 (six) hours as needed for severe pain or moderate pain.   allopurinol (ZYLOPRIM) 100 MG tablet, Take 1 tablet (100 mg total) by mouth daily. FOR GOUT PREVENTION, (Patient not taking: Reported on 03/16/2023)   aspirin EC 81 MG tablet, Take 1 tablet (81 mg total) by mouth 2 (two) times daily. Swallow whole. (Patient not taking: Reported on 03/16/2023)  Current Outpatient Medications (Hematological):    cyanocobalamin 1000 MCG tablet, Take 1,000 mcg by  mouth daily. (Patient not taking: Reported on 03/16/2023)  Current Outpatient Medications (Other):    cefadroxil (DURICEF) 500 MG capsule, Take 2 capsules (1,000 mg total) by mouth 2 (two) times daily.   Ensure Max Protein (ENSURE MAX PROTEIN) LIQD, Take 330 mLs (11 oz total) by mouth 2 (two) times daily.   gabapentin (NEURONTIN) 300 MG capsule, Take 1 capsule 4 x  /day for Pain (Patient taking differently: Take 300 mg by mouth 4 (four) times daily.)   glucose blood test strip, Use as instructed   Lancets (FREESTYLE) lancets, USE TO CHECK BLOOD GLUCOSE ONCE DAILY AS DIRECTED   lidocaine-prilocaine (EMLA) cream, Apply 1 application. topically daily as needed (for port access).   magnesium oxide (MAG-OX) 400 (240 Mg) MG tablet, TAKE 1 TABLET(400 MG) BY MOUTH DAILY   ondansetron (ZOFRAN) 8 MG tablet, Take 1 tablet (8 mg total) by mouth every 8 (eight) hours as needed for refractory nausea / vomiting.   pantoprazole (PROTONIX) 40 MG tablet, TAKE 1 TABLET(40 MG) BY MOUTH DAILY   polyethylene glycol (MIRALAX) 17 g packet, Take 17 g by mouth 2 (two) times daily. 17 grams in 6 oz of favorite drink twice a day until bowel movement.  LAXITIVE.  Restart if two days since last bowel movement (Patient taking differently: Take 17 g by mouth daily as needed for mild constipation.)   trolamine salicylate (ASPERCREME) 10 % cream, Apply 1 application  topically as needed (to both feet for neuropathic pain).   Cholecalciferol (VITAMIN D3) 5000 units CAPS, Take 5,000 Units by mouth daily. (Patient not taking: Reported on 03/16/2023)   vitamin C (ASCORBIC ACID) 500 MG tablet, Take 500 mg by mouth every evening.  (Patient not taking: Reported on 03/16/2023)  Current Problems (verified) Patient Active Problem List   Diagnosis Date Noted   MSSA (methicillin susceptible Staphylococcus aureus) infection 01/29/2023   Need for emotional support 11/08/2022   Enteritis 11/07/2022   Sepsis 11/06/2022   Palliative care  encounter 10/08/2022   Allergy to cephalosporin    Malnutrition of moderate degree 08/28/2022   Prosthetic hip infection 08/28/2022  Urinary tract infection without hematuria    Pancytopenia    Hypokalemia 08/24/2022   Hyperbilirubinemia 08/24/2022   Normocytic anemia 08/24/2022   Thrombocytopenia 08/24/2022   Protein-calorie malnutrition, severe 08/24/2022   Right hip pain 08/24/2022   Acute pancreatitis 08/09/2022   Stage 3b chronic kidney disease (CKD) 08/05/2022   Neutropenia 08/05/2022   Generalized weakness    Hypomagnesemia 02/22/2022   Poor dentition 07/26/2021   Nodule of lower lobe of right lung 06/27/2021   Aortic atherosclerosis (HCC) by Abd CT scan on 05/10/2021 06/10/2021   Mild nonproliferative diabetic retinopathy of both eyes without macular edema associated with type 2 diabetes mellitus 02/20/2021   Osteopenia 02/20/2021   Pancytopenia, acquired 05/15/2020   Cancer associated pain 05/15/2020   Other ascites 05/07/2020   Anemia in neoplastic disease 05/03/2020   Ovarian cancer 04/19/2020   Bilateral lower extremity edema 04/05/2020   CKD stage 3 due to type 2 diabetes mellitus 07/05/2018   Adhesive capsulitis of right shoulder 04/27/2018   Postoperative breast asymmetry 04/01/2017   History of therapeutic radiation 04/01/2017   Port catheter in place 09/18/2016   Chemotherapy-induced peripheral neuropathy 08/07/2016   History of left breast cancer 02/29/2016   Medication management 08/27/2014   Essential hypertension 11/10/2013   Hyperlipidemia associated with type 2 diabetes mellitus 11/10/2013   DM2 (diabetes mellitus, type 2) 11/10/2013   Vitamin D deficiency 11/10/2013    Screening Tests Immunization History  Administered Date(s) Administered   COVID-19, mRNA, vaccine(Comirnaty)12 years and older 10/30/2022   DTaP 07/01/2004   Fluad Quad(high Dose 65+) 10/30/2022   Influenza Whole 08/15/2013   Influenza, High Dose Seasonal PF 08/28/2014,  09/20/2015, 08/19/2017, 09/01/2018, 07/28/2019   Influenza-Unspecified 08/04/2020, 08/15/2021   PFIZER(Purple Top)SARS-COV-2 Vaccination 12/12/2019, 12/31/2019, 08/03/2020, 06/11/2021   Pneumococcal Conjugate-13 09/20/2015   Pneumococcal Polysaccharide-23 11/01/2009   Td 08/10/2009, 04/11/2020   Tdap 11/29/2022   Zoster, Live 08/02/2011    Preventative care: Last colonoscopy: 2019 will not need another Last mammogram: Completed 03/2022 Last pap smear/pelvic exam: remotely DEXA: 10/16/2021 - Due 2024  Prior vaccinations: TD or Tdap: 03/2020 Influenza:08/2022 Pneumococcal: 2010 Prevnar13: 2016  Shingles/Zostavax: 2012 discussed will get eventually, had first shingrix was advised not to get the second by oncologist  COVID complete 2021  Names of Other Physician/Practitioners you currently use: 1. [facility]- here for primary care 2. Dr. Lorin Picket, eye doctor, last visit 09/18/2022 - bil mild retinopathy last year, report requested 3. Dr. Ethelle Lyon, dentist, last visit 2023 -  plans to visit soon.  Patient Care Team: Lucky Cowboy, MD as PCP - Baird Lyons, OD as Referring Physician (Optometry) Campbell Stall, MD as Consulting Physician (Dermatology) Ranee Gosselin, MD as Consulting Physician (Orthopedic Surgery) Louis Meckel, MD (Inactive) as Consulting Physician (Gastroenterology) Artis Delay, MD as Consulting Physician (Hematology and Oncology) Bufford Buttner, MD as Consulting Physician (Nephrology) Sundra Aland, [facility] (Pharmacist)  Allergies Allergies  Allergen Reactions   Mobic [Meloxicam] Swelling   Prednisone Swelling and Other (See Comments)    Can Not take High doses   Premarin [Estrogens Conjugated] Hives   Trovan [Alatrofloxacin] Other (See Comments)    "I don't tolerate this well"    SURGICAL HISTORY She  has a past surgical history that includes Cataract extraction, bilateral (2014); Abdominal hysterectomy (1973);  Incontinence surgery (2006); Knee arthroscopy (Right); Hand surgery (Left, 2012); Colonoscopy; Radioactive seed guided mastectomy with axillary sentinel lymph node biopsy (Left, 03/18/2016); Cardiac catheterization; Portacath placement (N/A, 04/14/2016); Port-a-cath removal (N/A,  11/27/2016); Mastopexy (Right, 06/16/2017); Breast biopsy (Left, 02/26/2016); Breast lumpectomy (Left, 03/18/2016); Reduction mammaplasty (Right, 2018); IR IMAGING GUIDED PORT INSERTION (04/24/2020); IR Paracentesis (05/08/2020); IR Paracentesis (05/16/2020); IR Paracentesis (05/25/2020); Hip Arthroplasty (Right, 11/08/2021); and Total hip arthroplasty (Right, 08/29/2022).   FAMILY HISTORY Her family history includes Alzheimer's disease in her father; Breast cancer (age of onset: 7) in her sister; Cirrhosis in her sister; Diabetes in her father; Epilepsy in some other family members; Esophageal cancer (age of onset: 32) in her maternal aunt; Heart Problems in her maternal grandmother; Heart attack (age of onset: 15) in her maternal uncle; Hypertension in her sister; Lung cancer in an other family member; Lung cancer (age of onset: 65) in her sister; Other in her mother and sister; Other (age of onset: 56) in an other family member; Pancreatic cancer (age of onset: 49) in her cousin; Parkinson's disease in her maternal aunt; Stroke in her maternal grandmother; Stroke (age of onset: 42) in her mother.   SOCIAL HISTORY She  reports that she has never smoked. She has never used smokeless tobacco. She reports that she does not drink alcohol and does not use drugs.   Review of Systems  Constitutional:  Negative for malaise/fatigue and weight loss.  HENT:  Negative for hearing loss and tinnitus.   Eyes:  Negative for blurred vision and double vision.  Respiratory:  Negative for cough, sputum production, shortness of breath and wheezing.   Cardiovascular:  Negative for chest pain, palpitations, orthopnea, claudication, leg swelling and PND.   Gastrointestinal:  Negative for abdominal pain, blood in stool, constipation, diarrhea, heartburn, melena, nausea and vomiting.  Genitourinary: Negative.   Musculoskeletal:  Negative for falls, joint pain and myalgias.  Skin:  Negative for rash.  Neurological:  Positive for sensory change (bil feet/ankles, chronic following chemo). Negative for dizziness, tingling, weakness and headaches.  Endo/Heme/Allergies:  Negative for polydipsia.  Psychiatric/Behavioral: Negative.  Negative for depression, memory loss, substance abuse and suicidal ideas. The patient is not nervous/anxious and does not have insomnia.   All other systems reviewed and are negative.    Objective:   Today's Vitals   03/16/23 1346  BP: 112/60  Pulse: 82  Temp: (!) 97.5 F (36.4 C)  SpO2: 97%  Weight: 133 lb 6.4 oz (60.5 kg)  Height:  (1.6 m)   Body mass index is 23.63 kg/m.  General appearance: alert, no distress, WD/WN, elder female HEENT: normocephalic, sclerae anicteric, TMs pearly, nares patent, no discharge or erythema, pharynx normal Oral cavity: MMM, no lesions Neck: supple, no lymphadenopathy, no thyromegaly, no masses Heart: RRR, normal S1, S2, no murmurs Lungs: CTA bilaterally, no wheezes, rhonchi, or rales Abdomen: +bs, soft, non tender, non distended, no masses, no hepatomegaly, no splenomegaly Musculoskeletal: nontender, no swelling, no obvious deformity Extremities: LLE without edema, RLE with scant swelling around ankle, no cyanosis, no clubbing Pulses: 2+ symmetric, upper and lower extremities, normal cap refill Neurological: alert, oriented x 3, CN2-12 intact, strength normal upper extremities and lower extremities, sensation normal UE, bil LE diminished through feet and ankles to monofilament and light touch, DTRs 2+ throughout, no cerebellar signs, gait slow steady Psychiatric: normal affect, behavior normal, pleasant  Gyn: defer to GYN/onc   Adela Glimpse, NP   03/16/2023

## 2023-03-17 LAB — COMPLETE METABOLIC PANEL WITH GFR
AG Ratio: 1.2 (calc) (ref 1.0–2.5)
ALT: 16 U/L (ref 6–29)
AST: 36 U/L — ABNORMAL HIGH (ref 10–35)
Albumin: 3.6 g/dL (ref 3.6–5.1)
Alkaline phosphatase (APISO): 119 U/L (ref 37–153)
BUN/Creatinine Ratio: 23 (calc) — ABNORMAL HIGH (ref 6–22)
BUN: 32 mg/dL — ABNORMAL HIGH (ref 7–25)
CO2: 28 mmol/L (ref 20–32)
Calcium: 9.9 mg/dL (ref 8.6–10.4)
Chloride: 107 mmol/L (ref 98–110)
Creat: 1.37 mg/dL — ABNORMAL HIGH (ref 0.60–0.95)
Globulin: 3.1 g/dL (calc) (ref 1.9–3.7)
Glucose, Bld: 155 mg/dL — ABNORMAL HIGH (ref 65–99)
Potassium: 3.8 mmol/L (ref 3.5–5.3)
Sodium: 144 mmol/L (ref 135–146)
Total Bilirubin: 0.8 mg/dL (ref 0.2–1.2)
Total Protein: 6.7 g/dL (ref 6.1–8.1)
eGFR: 38 mL/min/{1.73_m2} — ABNORMAL LOW (ref >=60)

## 2023-03-17 LAB — MICROALBUMIN / CREATININE URINE RATIO
Creatinine, Urine: 71 mg/dL (ref 20–275)
Microalb Creat Ratio: 3 mg/g creat (ref <30)
Microalb, Ur: 0.2 mg/dL

## 2023-03-17 LAB — LIPID PANEL
Cholesterol: 123 mg/dL (ref <200)
HDL: 40 mg/dL — ABNORMAL LOW (ref >=50)
LDL Cholesterol (Calc): 62 mg/dL (calc)
Non-HDL Cholesterol (Calc): 83 mg/dL (calc) (ref <130)
Total CHOL/HDL Ratio: 3.1 (calc) (ref <5.0)
Triglycerides: 127 mg/dL (ref <150)

## 2023-03-17 LAB — CBC WITH DIFFERENTIAL/PLATELET
Absolute Monocytes: 388 cells/uL (ref 200–950)
Basophils Absolute: 30 cells/uL (ref 0–200)
Basophils Relative: 0.8 %
Eosinophils Absolute: 160 cells/uL (ref 15–500)
Eosinophils Relative: 4.2 %
HCT: 35.3 % (ref 35.0–45.0)
Hemoglobin: 11.5 g/dL — ABNORMAL LOW (ref 11.7–15.5)
Lymphs Abs: 1300 cells/uL (ref 850–3900)
MCH: 31 pg (ref 27.0–33.0)
MCHC: 32.6 g/dL (ref 32.0–36.0)
MCV: 95.1 fL (ref 80.0–100.0)
MPV: 10.2 fL (ref 7.5–12.5)
Monocytes Relative: 10.2 %
Neutro Abs: 1923 cells/uL (ref 1500–7800)
Neutrophils Relative %: 50.6 %
Platelets: 92 10*3/uL — ABNORMAL LOW (ref 140–400)
RBC: 3.71 10*6/uL — ABNORMAL LOW (ref 3.80–5.10)
RDW: 15 % (ref 11.0–15.0)
Total Lymphocyte: 34.2 %
WBC: 3.8 10*3/uL (ref 3.8–10.8)

## 2023-03-17 LAB — URINALYSIS, ROUTINE W REFLEX MICROSCOPIC
Bacteria, UA: NONE SEEN /HPF
Bilirubin Urine: NEGATIVE
Glucose, UA: NEGATIVE
Hgb urine dipstick: NEGATIVE
Hyaline Cast: NONE SEEN /LPF
Ketones, ur: NEGATIVE
Nitrite: NEGATIVE
Protein, ur: NEGATIVE
RBC / HPF: NONE SEEN /HPF (ref 0–2)
Specific Gravity, Urine: 1.014 (ref 1.001–1.035)
pH: <=5 (ref 5.0–8.0)

## 2023-03-17 LAB — HEMOGLOBIN A1C
Hgb A1c MFr Bld: 5.8 % of total Hgb — ABNORMAL HIGH (ref <5.7)
Mean Plasma Glucose: 120 mg/dL
eAG (mmol/L): 6.6 mmol/L

## 2023-03-17 LAB — MICROSCOPIC MESSAGE

## 2023-03-17 LAB — VITAMIN D 25 HYDROXY (VIT D DEFICIENCY, FRACTURES): Vit D, 25-Hydroxy: 98 ng/mL (ref 30–100)

## 2023-03-17 LAB — MAGNESIUM: Magnesium: 1.4 mg/dL — ABNORMAL LOW (ref 1.5–2.5)

## 2023-03-22 NOTE — Patient Instructions (Signed)

## 2023-03-23 ENCOUNTER — Other Ambulatory Visit: Payer: Self-pay

## 2023-03-23 MED ORDER — ROSUVASTATIN CALCIUM 5 MG PO TABS: 5.0000 mg | ORAL_TABLET | ORAL | 3 refills | Status: DC

## 2023-03-31 ENCOUNTER — Encounter: Payer: Self-pay | Admitting: Infectious Disease

## 2023-03-31 ENCOUNTER — Other Ambulatory Visit: Payer: Self-pay

## 2023-03-31 ENCOUNTER — Ambulatory Visit (INDEPENDENT_AMBULATORY_CARE_PROVIDER_SITE_OTHER): Admitting: Infectious Disease

## 2023-03-31 VITALS — BP 103/67 | HR 78 | Temp 97.4°F | Wt 133.0 lb

## 2023-03-31 DIAGNOSIS — A4901 Methicillin susceptible Staphylococcus aureus infection, unspecified site: Secondary | ICD-10-CM | POA: Diagnosis not present

## 2023-03-31 DIAGNOSIS — Z853 Personal history of malignant neoplasm of breast: Secondary | ICD-10-CM

## 2023-03-31 DIAGNOSIS — Z23 Encounter for immunization: Secondary | ICD-10-CM

## 2023-03-31 DIAGNOSIS — C569 Malignant neoplasm of unspecified ovary: Secondary | ICD-10-CM

## 2023-03-31 DIAGNOSIS — T8451XD Infection and inflammatory reaction due to internal right hip prosthesis, subsequent encounter: Secondary | ICD-10-CM | POA: Diagnosis not present

## 2023-03-31 DIAGNOSIS — Z7185 Encounter for immunization safety counseling: Secondary | ICD-10-CM

## 2023-03-31 DIAGNOSIS — Z96649 Presence of unspecified artificial hip joint: Secondary | ICD-10-CM | POA: Diagnosis not present

## 2023-03-31 DIAGNOSIS — T8459XD Infection and inflammatory reaction due to other internal joint prosthesis, subsequent encounter: Secondary | ICD-10-CM | POA: Diagnosis not present

## 2023-03-31 LAB — CBC WITH DIFFERENTIAL/PLATELET
Basophils Relative: 0.7 %
Eosinophils Absolute: 112 cells/uL (ref 15–500)
Eosinophils Relative: 4 %
Hemoglobin: 11.4 g/dL — ABNORMAL LOW (ref 11.7–15.5)
MCHC: 33.1 g/dL (ref 32.0–36.0)
Monocytes Relative: 17 %
Neutrophils Relative %: 47.1 %
Platelets: 96 10*3/uL — ABNORMAL LOW (ref 140–400)
RBC: 3.66 10*6/uL — ABNORMAL LOW (ref 3.80–5.10)
WBC: 2.8 10*3/uL — ABNORMAL LOW (ref 3.8–10.8)

## 2023-03-31 MED ORDER — CEFADROXIL 500 MG PO CAPS: 1000.0000 mg | ORAL_CAPSULE | Freq: Two times a day (BID) | ORAL | 11 refills | Status: DC

## 2023-03-31 NOTE — Progress Notes (Signed)
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   MRN: [id number]  HPI  87 y.o. female with hx of HTN, hyperlipidemia, breast cancer, cephalosporin allergy and recent ovarian cancer with carcinomatosis who presented to ER w abdominal pain (her ascites) and right  hip pain with abscess found in gluteal muscles that appears to communicate with hip. She is now sp I and D and polyexchange in OR We challenged her with oral cephalexin which she tolerated and we switched her over to cefazolin to treat her prosthetic joint infection.  Kayla Baker's pain has improved dramatically since she was in the hospital she is frustrated though that she still experiences hip pain when she sits down and also when she gets up to pivot and that she feels she is too dependent on her walker compared to when she fractured a bone more than a year ago.  She has completed her cefazolin and is now on cefadroxil.  Dr. Bertis Ruddy has met with the patient in January and informed her that she did not find indication for further chemotherapy for the patient provided here locally in terms of palliative chemotherapy.  She did recommend if the patient wanted further treatment that she seek out treatment at another institution.    She has since seen Dr. Dellis Filbert at [facility] who gave recommendations to pass on to Dr. Bertis Ruddy vs option of the patient being seen at [facility].   She relays that Dr. Bertis Ruddy did not want to treat her--though I do not see further notes from Dr. Bertis Ruddy. She also states that going to [facility] would be too much trouble. She stated that [facility] was out of "network."   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HCC) 02/29/2016   skin- 2016- squamous- on right upper arm   Chronic kidney  disease    Closed displaced fracture of right femoral neck (HCC) 11/07/2021   Deficiency anemia 11/02/2020   Diabetes mellitus without complication (HCC)    GERD (gastroesophageal reflux disease)    Gout    takes Allopurinol daily   History of chemotherapy    History of colon polyps    benign   History of shingles    Hx of radiation therapy    Hyperlipidemia    Hypertension    MSSA (methicillin susceptible Staphylococcus aureus) infection 01/29/2023   Ovarian ca (HCC) dx'd 03/2020   Personal history of chemotherapy    2017   Personal history of radiation therapy    2017   Vitamin D deficiency     Past Surgical History:  Procedure Laterality Date   ABDOMINAL HYSTERECTOMY  1973   BREAST BIOPSY Left 02/26/2016   BREAST LUMPECTOMY Left 03/18/2016   CARDIAC CATHETERIZATION     patient denies this procedure   CATARACT EXTRACTION, BILATERAL  2014   right eye 2/14; left eye 3/3   COLONOSCOPY     HAND SURGERY Left 2012   HIP ARTHROPLASTY Right 11/08/2021   Procedure: ARTHROPLASTY BIPOLAR HIP (HEMIARTHROPLASTY) POSTERIOR;  Surgeon: Joen [name], MD;  Location: [facility];  Service: Orthopedics;  Laterality: Right;   INCONTINENCE SURGERY  2006   IR IMAGING GUIDED PORT INSERTION  04/24/2020   IR PARACENTESIS  05/08/2020   IR PARACENTESIS  05/16/2020   IR PARACENTESIS  05/25/2020   KNEE ARTHROSCOPY Right    MASTOPEXY Right 06/16/2017   Procedure: RIGHT BREAST MASTOPEXY;  Surgeon: Glenna Fellows, MD;  Location: [facility];  Service: Plastics;  Laterality: Right;   PORT-A-CATH REMOVAL N/A 11/27/2016   Procedure: REMOVAL PORT-A-CATH;  Surgeon: Claud Kelp, MD;  Location: [facility];  Service: General;  Laterality: N/A;   PORTACATH PLACEMENT N/A 04/14/2016   Procedure: INSERTION PORT-A-CATH ;  Surgeon: Claud Kelp, MD;  Location: [facility];  Service: General;  Laterality: N/A;   RADIOACTIVE SEED GUIDED PARTIAL MASTECTOMY WITH AXILLARY SENTINEL LYMPH NODE BIOPSY Left 03/18/2016    Procedure: RADIOACTIVE SEED GUIDED LEFT PARTIAL MASTECTOMY WITH AXILLARY SENTINEL LYMPH NODE BIOPSY AND BLUE DYE INJECTION;  Surgeon: Claud Kelp, MD;  Location: [facility];  Service: General;  Laterality: Left;   REDUCTION MAMMAPLASTY Right 2018   TOTAL HIP ARTHROPLASTY Right 08/29/2022   Procedure: incision and drainage hip head exchange;  Surgeon: Joen [name], MD;  Location: [facility];  Service: Orthopedics;  Laterality: Right;    Family History  Problem Relation Age of Onset   Stroke Mother 6   Other Mother        history of hysterectomy after last childbirth   Diabetes Father    Alzheimer's disease Father    Hypertension Sister    Lung cancer Sister 76       smoker   Other Sister        history of hysterectomy for fibroids   Breast cancer Sister 10   Cirrhosis Sister    Stroke Maternal Grandmother    Heart Problems Maternal Grandmother    Esophageal cancer Maternal Aunt 77       not a smoker   Parkinson's disease Maternal Aunt    Heart attack Maternal Uncle 64   Pancreatic cancer Cousin 55       paternal 1st cousin   Lung cancer Other        nephew dx. 54s; +smoker   Epilepsy Other    Other Other 12       great niece dx. benign ganglioglioma brain tumor; treated at [facility]   Epilepsy Other        no seizures in 2 years   Colon cancer Neg Hx    Stomach cancer Neg Hx    Colon polyps Neg Hx    Ulcerative colitis Neg Hx       Social History   Socioeconomic History   Marital status: Married    Spouse name: [name]   Number of children: 0   Years of education: Not on file   Highest education level: Not on file  Occupational History   Not on file  Tobacco Use   Smoking status: Never   Smokeless tobacco: Never  Vaping Use   Vaping Use: Never used  Substance and Sexual Activity   Alcohol use: No   Drug use: No   Sexual activity: Not Currently    Birth control/protection: Surgical, Post-menopausal  Other Topics Concern   Not on file  Social History Narrative    Not on file   Social Determinants of Health   Financial Resource Strain: Not on file  Food Insecurity: No Food Insecurity (08/24/2022)   Hunger Vital Sign    Worried About Running Out of Food in the Last Year: Never true    Ran Out of Food in the Last Year: Never true  Transportation Needs: No Transportation Needs (08/24/2022)   PRAPARE -  Administrator, Civil Service (Medical): No    Lack of Transportation (Non-Medical): No  Physical Activity: Not on file  Stress: Not on file  Social Connections: Not on file    Allergies  Allergen Reactions   Mobic [Meloxicam] Swelling   Prednisone Swelling and Other (See Comments)    Can Not take High doses   Premarin [Estrogens Conjugated] Hives   Trovan [Alatrofloxacin] Other (See Comments)    "I don't tolerate this well"     Current Outpatient Medications:    acetaminophen (TYLENOL) 500 MG tablet, Take 1,000 mg by mouth every 6 (six) hours as needed for mild pain, headache or fever., Disp: , Rfl:    allopurinol (ZYLOPRIM) 100 MG tablet, Take 1 tablet (100 mg total) by mouth daily. FOR GOUT PREVENTION, (Patient not taking: Reported on 03/16/2023), Disp: 90 tablet, Rfl: 3   aspirin EC 81 MG tablet, Take 1 tablet (81 mg total) by mouth 2 (two) times daily. Swallow whole. (Patient not taking: Reported on 03/16/2023), Disp: 30 tablet, Rfl: 12   cefadroxil (DURICEF) 500 MG capsule, Take 2 capsules (1,000 mg total) by mouth 2 (two) times daily., Disp: 120 capsule, Rfl: 11   Cholecalciferol (VITAMIN D3) 5000 units CAPS, Take 5,000 Units by mouth daily. (Patient not taking: Reported on 03/16/2023), Disp: , Rfl:    cyanocobalamin 1000 MCG tablet, Take 1,000 mcg by mouth daily. (Patient not taking: Reported on 03/16/2023), Disp: , Rfl:    Ensure Max Protein (ENSURE MAX PROTEIN) LIQD, Take 330 mLs (11 oz total) by mouth 2 (two) times daily., Disp: , Rfl:    ezetimibe (ZETIA) 10 MG tablet, TAKE 1 TABLET BY MOUTH DAILY FOR CHOLESTEROL (Patient  taking differently: Take 10 mg by mouth daily.), Disp: 90 tablet, Rfl: 3   furosemide (LASIX) 20 MG tablet, Take by mouth., Disp: , Rfl:    gabapentin (NEURONTIN) 300 MG capsule, Take 1 capsule 4 x  /day for Pain (Patient taking differently: Take 300 mg by mouth 4 (four) times daily.), Disp: 360 capsule, Rfl: 3   glimepiride (AMARYL) 1 MG tablet, Take 1 tab by mouth in the morning if fasting is running 150+. Please do not take this medication if you are ill or not eating as this can cause a low blood sugar., Disp: 90 tablet, Rfl: 1   glucose blood test strip, Use as instructed, Disp: 100 each, Rfl: 12   hydrochlorothiazide (MICROZIDE) 12.5 MG capsule, Take 1 capsule (12.5 mg total) by mouth daily., Disp: 30 capsule, Rfl: 0   Lancets (FREESTYLE) lancets, USE TO CHECK BLOOD GLUCOSE ONCE DAILY AS DIRECTED, Disp: 100 each, Rfl: 3   lidocaine-prilocaine (EMLA) cream, Apply 1 application. topically daily as needed (for port access)., Disp: 30 g, Rfl: 3   loratadine (CLARITIN) 10 MG tablet, Take by mouth., Disp: , Rfl:    magnesium oxide (MAG-OX) 400 (240 Mg) MG tablet, TAKE 1 TABLET(400 MG) BY MOUTH DAILY, Disp: 30 tablet, Rfl: 1   metoprolol succinate (TOPROL-XL) 25 MG 24 hr tablet, Take  1 tablet  Daily  for BP                                                          /  TAKE                            BY                                 MOUTH (Patient taking differently: Take 25 mg by mouth in the morning.), Disp: 90 tablet, Rfl: 3   ondansetron (ZOFRAN) 8 MG tablet, Take 1 tablet (8 mg total) by mouth every 8 (eight) hours as needed for refractory nausea / vomiting., Disp: 30 tablet, Rfl: 1   oxyCODONE (OXY IR/ROXICODONE) 5 MG immediate release tablet, Take 1 tablet (5 mg total) by mouth every 6 (six) hours as needed for severe pain or moderate pain., Disp: 30 tablet, Rfl: 0   pantoprazole (PROTONIX) 40 MG tablet, TAKE 1 TABLET(40 MG) BY MOUTH DAILY, Disp: 90 tablet,  Rfl: 0   polyethylene glycol (MIRALAX) 17 g packet, Take 17 g by mouth 2 (two) times daily. 17 grams in 6 oz of favorite drink twice a day until bowel movement.  LAXITIVE.  Restart if two days since last bowel movement (Patient taking differently: Take 17 g by mouth daily as needed for mild constipation.), Disp: 14 packet, Rfl: 0   rosuvastatin (CRESTOR) 5 MG tablet, Take 1 tablet (5 mg total) by mouth 3 (three) times a week., Disp: 36 tablet, Rfl: 3   trolamine salicylate (ASPERCREME) 10 % cream, Apply 1 application  topically as needed (to both feet for neuropathic pain)., Disp: , Rfl:    vitamin C (ASCORBIC ACID) 500 MG tablet, Take 500 mg by mouth every evening.  (Patient not taking: Reported on 03/16/2023), Disp: , Rfl:    Review of Systems  Constitutional:  Negative for activity change, appetite change, chills, diaphoresis, fatigue, fever and unexpected weight change.  HENT:  Negative for congestion, rhinorrhea, sinus pressure, sneezing, sore throat and trouble swallowing.   Eyes:  Negative for photophobia and visual disturbance.  Respiratory:  Negative for cough, chest tightness, shortness of breath, wheezing and stridor.   Cardiovascular:  Negative for chest pain, palpitations and leg swelling.  Gastrointestinal:  Negative for abdominal distention, abdominal pain, anal bleeding, blood in stool, constipation, diarrhea, nausea and vomiting.  Genitourinary:  Negative for difficulty urinating, dysuria, flank pain and hematuria.  Musculoskeletal:  Negative for arthralgias, back pain, gait problem, joint swelling and myalgias.  Skin:  Negative for color change, pallor, rash and wound.  Neurological:  Negative for dizziness, tremors, weakness, light-headedness and headaches.  Hematological:  Negative for adenopathy. Does not bruise/bleed easily.  Psychiatric/Behavioral:  Negative for agitation, behavioral problems, confusion, decreased concentration, dysphoric mood, sleep disturbance and suicidal  ideas.        Objective:   Physical Exam Constitutional:      General: She is not in acute distress.    Appearance: She is not diaphoretic.  HENT:     Head: Normocephalic and atraumatic.     Right Ear: External ear normal.     Left Ear: External ear normal.     Nose: Nose normal.     Mouth/Throat:     Pharynx: No oropharyngeal exudate.  Eyes:     General: No scleral icterus.       Right eye: No discharge.        Left eye: No discharge.     Extraocular Movements: Extraocular movements intact.     Conjunctiva/sclera: Conjunctivae normal.  Cardiovascular:     Rate and Rhythm: Normal rate and regular rhythm.     Heart sounds:     No friction rub.  Pulmonary:     Effort: Pulmonary effort is normal. No respiratory distress.     Breath sounds: No wheezing.  Abdominal:     General: There is no distension.  Musculoskeletal:        General: No tenderness. Normal range of motion.     Cervical back: Normal range of motion and neck supple.  Lymphadenopathy:     Cervical: No cervical adenopathy.  Skin:    General: Skin is warm and dry.     Coloration: Skin is not jaundiced or pale.     Findings: No erythema, lesion or rash.  Neurological:     General: No focal deficit present.     Mental Status: She is alert and oriented to person, place, and time.     Coordination: Coordination normal.  Psychiatric:        Mood and Affect: Mood normal.        Behavior: Behavior normal.        Thought Content: Thought content normal.        Judgment: Judgment normal.          Assessment & Plan:  Static hip infection status post I&D and exchange with culprit organism being MSSA:  Recheck sed rate CRP BMP with GFR CBC with differential will continue cefadroxil.  If she never goes on chemotherapy could consider trialing her off cefadroxil after she gets to September but if she does go on chemo would prefer her on antibiotics indefinitely  Her pain in the hip is minimal at present which is  reassuring.  Ovarian cancer: She is still appears to wish for some type of treatment I will CC Dr. Bertis Ruddy on this note since what the patient is telling me does not entirely line up with what I can see in the chart. ? She need to go to [place] for care. Could she not go to [facility] in [place] if no treatment in [place] would seem more practical  Vaccine recommended COVID-19 vaccine be repeated today which she received  PJI sp I and D and exchange with culprit organism being MSSA: Continue indefinite cefadroxil 2 tablets twice daily.

## 2023-04-01 ENCOUNTER — Telehealth: Payer: Self-pay

## 2023-04-01 LAB — COMPLETE METABOLIC PANEL WITH GFR
AG Ratio: 1.2 (calc) (ref 1.0–2.5)
ALT: 22 U/L (ref 6–29)
AST: 42 U/L — ABNORMAL HIGH (ref 10–35)
Albumin: 3.4 g/dL — ABNORMAL LOW (ref 3.6–5.1)
Alkaline phosphatase (APISO): 133 U/L (ref 37–153)
BUN/Creatinine Ratio: 21 (calc) (ref 6–22)
BUN: 34 mg/dL — ABNORMAL HIGH (ref 7–25)
CO2: 27 mmol/L (ref 20–32)
Calcium: 10 mg/dL (ref 8.6–10.4)
Chloride: 106 mmol/L (ref 98–110)
Creat: 1.63 mg/dL — ABNORMAL HIGH (ref 0.60–0.95)
Globulin: 2.9 g/dL (calc) (ref 1.9–3.7)
Glucose, Bld: 152 mg/dL — ABNORMAL HIGH (ref 65–99)
Potassium: 3.9 mmol/L (ref 3.5–5.3)
Sodium: 142 mmol/L (ref 135–146)
Total Bilirubin: 0.8 mg/dL (ref 0.2–1.2)
Total Protein: 6.3 g/dL (ref 6.1–8.1)
eGFR: 31 mL/min/{1.73_m2} — ABNORMAL LOW (ref >=60)

## 2023-04-01 LAB — C-REACTIVE PROTEIN: CRP: 11.1 mg/L — ABNORMAL HIGH (ref <8.0)

## 2023-04-01 LAB — CBC WITH DIFFERENTIAL/PLATELET
Absolute Monocytes: 476 cells/uL (ref 200–950)
Basophils Absolute: 20 cells/uL (ref 0–200)
HCT: 34.4 % — ABNORMAL LOW (ref 35.0–45.0)
Lymphs Abs: 874 cells/uL (ref 850–3900)
MCH: 31.1 pg (ref 27.0–33.0)
MCV: 94 fL (ref 80.0–100.0)
MPV: 12.5 fL (ref 7.5–12.5)
Neutro Abs: 1319 cells/uL — ABNORMAL LOW (ref 1500–7800)
RDW: 14.5 % (ref 11.0–15.0)
Total Lymphocyte: 31.2 %

## 2023-04-01 LAB — SEDIMENTATION RATE: Sed Rate: 25 mm/h (ref 0–30)

## 2023-04-01 NOTE — Telephone Encounter (Signed)
Received call from Darl Pikes [facility] RN with [facility]. She was notified of worsening kidney function by patient's husband and called for update. Relayed recent creatinine and BUN. She will update their hospice physician, states patient probably will not be following up with PCP regularly due to hospice status.   Labs routed to patient's PCP.   Sandie Ano, RN

## 2023-04-01 NOTE — Telephone Encounter (Signed)
Informed patient spouse - Hasel Janish, per Dr.Van Dam - patient renal function is a bit worse and to follow up with PCP. Ronnie voiced his understanding.    Breauna Mazzeo Lesli Albee, CMA

## 2023-04-01 NOTE — Telephone Encounter (Signed)
-----   Message from Randall Hiss, MD sent at 04/01/2023  9:02 AM EDT ----- Regarding: renal fxn a bit worse, would have her check in w pcp  ----- Message ----- From: Janace Hoard Lab Results In Sent: 03/31/2023   6:12 PM EDT To: Randall Hiss, MD

## 2023-04-16 ENCOUNTER — Encounter: Payer: Self-pay | Admitting: Hematology and Oncology

## 2023-05-29 ENCOUNTER — Ambulatory Visit: Payer: PPO | Admitting: Nurse Practitioner

## 2023-06-01 ENCOUNTER — Other Ambulatory Visit: Payer: Self-pay

## 2023-06-01 DIAGNOSIS — E1169 Type 2 diabetes mellitus with other specified complication: Secondary | ICD-10-CM

## 2023-06-01 MED ORDER — EZETIMIBE 10 MG PO TABS
ORAL_TABLET | ORAL | 3 refills | Status: DC
Start: 2023-06-01 — End: 2023-10-27

## 2023-06-15 NOTE — Progress Notes (Unsigned)
HOSPICE PATIENT REQUIRES GV modifier  Assessment and Plan:  There are no diagnoses linked to this encounter.    Further disposition pending results of labs. Discussed med's effects and SE's.   Over 30 minutes of exam, counseling, chart review, and critical decision making was performed.   Future Appointments  Date Time Provider Department Center  06/16/2023  3:45 PM Raynelle Dick, NP [facility] None  06/25/2023 11:30 AM Adela Glimpse, NP [facility] None  08/18/2023  9:45 AM Daiva Eves, Lisette Grinder, MD [facility]  03/15/2024  2:00 PM Adela Glimpse, NP [facility] None    ------------------------------------------------------------------------------------------------------------------   HPI There were no vitals taken for this visit. 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HCC) 02/29/2016   skin- 2016- squamous- on right upper arm   Chronic kidney disease    Closed displaced fracture of right femoral neck (HCC) 11/07/2021   Deficiency anemia 11/02/2020   Diabetes mellitus without complication (HCC)    GERD (gastroesophageal reflux disease)    Gout    takes Allopurinol daily   History of chemotherapy    History of colon polyps    benign   History of shingles    Hx of radiation therapy    Hyperlipidemia    Hypertension    MSSA (methicillin susceptible Staphylococcus aureus) infection 01/29/2023   Ovarian ca (HCC) dx'd 03/2020   Personal history of chemotherapy    2017   Personal history of radiation therapy    2017   Vitamin D deficiency      Allergies  Allergen Reactions   Mobic [Meloxicam] Swelling   Prednisone Swelling and Other (See Comments)    Can Not take High doses   Premarin [Estrogens Conjugated] Hives   Trovan [Alatrofloxacin] Other (See Comments)    "I don't tolerate this  well"    Current Outpatient Medications on File Prior to Visit  Medication Sig   acetaminophen (TYLENOL) 500 MG tablet Take 1,000 mg by mouth every 6 (six) hours as needed for mild pain, headache or fever.   cefadroxil (DURICEF) 500 MG capsule Take 2 capsules (1,000 mg total) by mouth 2 (two) times daily.   Ensure Max Protein (ENSURE MAX PROTEIN) LIQD Take 330 mLs (11 oz total) by mouth 2 (two) times daily.   ezetimibe (ZETIA) 10 MG tablet TAKE 1 TABLET BY MOUTH DAILY FOR CHOLESTEROL   glimepiride (AMARYL) 1 MG tablet Take 1 tab by mouth in the morning if fasting is running 150+. Please do not take this medication if you are ill or not eating as this can cause a low blood sugar.   glucose blood test strip Use as instructed   hydrochlorothiazide (MICROZIDE) 12.5 MG capsule Take 1 capsule (12.5 mg total) by mouth daily.   Lancets (FREESTYLE) lancets USE TO CHECK BLOOD GLUCOSE ONCE DAILY AS DIRECTED   lidocaine-prilocaine (EMLA) cream Apply 1 application. topically daily as needed (for port access).   loratadine (CLARITIN) 10 MG tablet Take by mouth.   magnesium oxide (MAG-OX) 400 (240 Mg) MG tablet TAKE 1 TABLET(400 MG) BY MOUTH DAILY   ondansetron (ZOFRAN) 8 MG tablet Take 1 tablet (8 mg total) by mouth every 8 (eight) hours as needed for refractory nausea / vomiting.   oxyCODONE (OXY IR/ROXICODONE) 5 MG immediate release tablet Take 1 tablet (5  mg total) by mouth every 6 (six) hours as needed for severe pain or moderate pain.   pantoprazole (PROTONIX) 40 MG tablet TAKE 1 TABLET(40 MG) BY MOUTH DAILY   polyethylene glycol (MIRALAX) 17 g packet Take 17 g by mouth 2 (two) times daily. 17 grams in 6 oz of favorite drink twice a day until bowel movement.  LAXITIVE.  Restart if two days since last bowel movement (Patient taking differently: Take 17 g by mouth daily as needed for mild constipation.)   rosuvastatin (CRESTOR) 5 MG tablet Take 1 tablet (5 mg total) by mouth 3 (three) times a week.    trolamine salicylate (ASPERCREME) 10 % cream Apply 1 application  topically as needed (to both feet for neuropathic pain).   vitamin C (ASCORBIC ACID) 500 MG tablet Take 500 mg by mouth every evening.  (Patient not taking: Reported on 03/16/2023)   No current facility-administered medications on file prior to visit.    ROS: all negative except above.   Physical Exam:  There were no vitals taken for this visit.  General Appearance: Well nourished, in no apparent distress. Eyes: PERRLA, EOMs, conjunctiva no swelling or erythema Sinuses: No Frontal/maxillary tenderness ENT/Mouth: Ext aud canals clear, TMs without erythema, bulging. No erythema, swelling, or exudate on post pharynx.  Tonsils not swollen or erythematous. Hearing normal.  Neck: Supple, thyroid normal.  Respiratory: Respiratory effort normal, BS equal bilaterally without rales, rhonchi, wheezing or stridor.  Cardio: RRR with no MRGs. Brisk peripheral pulses without edema.  Abdomen: Soft, + BS.  Non tender, no guarding, rebound, hernias, masses. Lymphatics: Non tender without lymphadenopathy.  Musculoskeletal: Full ROM, 5/5 strength, normal gait.  Skin: Warm, dry without rashes, lesions, ecchymosis.  Neuro: Cranial nerves intact. Normal muscle tone, no cerebellar symptoms. Sensation intact.  Psych: Awake and oriented X 3, normal affect, Insight and Judgment appropriate.     Raynelle Dick, NP 12:48 PM [facility]

## 2023-06-16 ENCOUNTER — Encounter: Payer: Self-pay | Admitting: Nurse Practitioner

## 2023-06-16 ENCOUNTER — Encounter: Payer: Self-pay | Admitting: Hematology and Oncology

## 2023-06-16 ENCOUNTER — Ambulatory Visit (INDEPENDENT_AMBULATORY_CARE_PROVIDER_SITE_OTHER): Payer: PPO | Admitting: Nurse Practitioner

## 2023-06-16 VITALS — BP 98/60 | HR 74 | Temp 97.3°F | Ht 63.0 in | Wt 128.4 lb

## 2023-06-16 DIAGNOSIS — N939 Abnormal uterine and vaginal bleeding, unspecified: Secondary | ICD-10-CM

## 2023-06-16 DIAGNOSIS — C569 Malignant neoplasm of unspecified ovary: Secondary | ICD-10-CM

## 2023-06-17 ENCOUNTER — Ambulatory Visit: Payer: PPO | Admitting: Nurse Practitioner

## 2023-06-25 ENCOUNTER — Ambulatory Visit (INDEPENDENT_AMBULATORY_CARE_PROVIDER_SITE_OTHER): Payer: PPO | Admitting: Nurse Practitioner

## 2023-06-25 ENCOUNTER — Encounter: Payer: Self-pay | Admitting: Nurse Practitioner

## 2023-06-25 VITALS — BP 100/50 | HR 73 | Temp 97.4°F | Ht 63.0 in | Wt 128.8 lb

## 2023-06-25 DIAGNOSIS — D61818 Other pancytopenia: Secondary | ICD-10-CM | POA: Diagnosis not present

## 2023-06-25 DIAGNOSIS — R911 Solitary pulmonary nodule: Secondary | ICD-10-CM | POA: Diagnosis not present

## 2023-06-25 DIAGNOSIS — I10 Essential (primary) hypertension: Secondary | ICD-10-CM

## 2023-06-25 DIAGNOSIS — Z0000 Encounter for general adult medical examination without abnormal findings: Secondary | ICD-10-CM

## 2023-06-25 DIAGNOSIS — I700 Atherosclerosis of aorta: Secondary | ICD-10-CM | POA: Diagnosis not present

## 2023-06-25 DIAGNOSIS — E785 Hyperlipidemia, unspecified: Secondary | ICD-10-CM

## 2023-06-25 DIAGNOSIS — K089 Disorder of teeth and supporting structures, unspecified: Secondary | ICD-10-CM | POA: Diagnosis not present

## 2023-06-25 DIAGNOSIS — Z79899 Other long term (current) drug therapy: Secondary | ICD-10-CM | POA: Diagnosis not present

## 2023-06-25 DIAGNOSIS — R6889 Other general symptoms and signs: Secondary | ICD-10-CM | POA: Diagnosis not present

## 2023-06-25 DIAGNOSIS — M8588 Other specified disorders of bone density and structure, other site: Secondary | ICD-10-CM | POA: Diagnosis not present

## 2023-06-25 DIAGNOSIS — C569 Malignant neoplasm of unspecified ovary: Secondary | ICD-10-CM | POA: Diagnosis not present

## 2023-06-25 DIAGNOSIS — Z853 Personal history of malignant neoplasm of breast: Secondary | ICD-10-CM | POA: Diagnosis not present

## 2023-06-25 DIAGNOSIS — N939 Abnormal uterine and vaginal bleeding, unspecified: Secondary | ICD-10-CM

## 2023-06-25 DIAGNOSIS — E1122 Type 2 diabetes mellitus with diabetic chronic kidney disease: Secondary | ICD-10-CM | POA: Diagnosis not present

## 2023-06-25 DIAGNOSIS — E1169 Type 2 diabetes mellitus with other specified complication: Secondary | ICD-10-CM | POA: Diagnosis not present

## 2023-06-25 DIAGNOSIS — G620 Drug-induced polyneuropathy: Secondary | ICD-10-CM | POA: Diagnosis not present

## 2023-06-25 DIAGNOSIS — E113293 Type 2 diabetes mellitus with mild nonproliferative diabetic retinopathy without macular edema, bilateral: Secondary | ICD-10-CM

## 2023-06-25 DIAGNOSIS — E559 Vitamin D deficiency, unspecified: Secondary | ICD-10-CM

## 2023-06-25 DIAGNOSIS — Z0001 Encounter for general adult medical examination with abnormal findings: Secondary | ICD-10-CM

## 2023-06-25 DIAGNOSIS — N183 Chronic kidney disease, stage 3 unspecified: Secondary | ICD-10-CM | POA: Diagnosis not present

## 2023-06-25 LAB — CBC WITH DIFFERENTIAL/PLATELET
Absolute Monocytes: 397 cells/uL (ref 200–950)
Basophils Absolute: 38 cells/uL (ref 0–200)
Basophils Relative: 1.2 %
Eosinophils Absolute: 138 cells/uL (ref 15–500)
Eosinophils Relative: 4.3 %
HCT: 32.7 % — ABNORMAL LOW (ref 35.0–45.0)
Hemoglobin: 11.1 g/dL — ABNORMAL LOW (ref 11.7–15.5)
Lymphs Abs: 835 cells/uL — ABNORMAL LOW (ref 850–3900)
MCH: 31.4 pg (ref 27.0–33.0)
MCHC: 33.9 g/dL (ref 32.0–36.0)
MCV: 92.6 fL (ref 80.0–100.0)
MPV: 10.7 fL (ref 7.5–12.5)
Monocytes Relative: 12.4 %
Neutro Abs: 1792 cells/uL (ref 1500–7800)
Neutrophils Relative %: 56 %
Platelets: 78 10*3/uL — ABNORMAL LOW (ref 140–400)
RBC: 3.53 10*6/uL — ABNORMAL LOW (ref 3.80–5.10)
RDW: 14.3 % (ref 11.0–15.0)
Total Lymphocyte: 26.1 %
WBC: 3.2 10*3/uL — ABNORMAL LOW (ref 3.8–10.8)

## 2023-06-25 NOTE — Patient Instructions (Signed)

## 2023-06-25 NOTE — Progress Notes (Signed)
AWV and FOLLOW UP  Assessment:   Encounter for Medicare annual wellness exam Due annually Health Maintenance reviewed  Essential hypertension Discussed DASH (Dietary Approaches to Stop Hypertension) DASH diet is lower in sodium than a typical American diet. Cut back on foods that are high in saturated fat, cholesterol, and trans fats. Eat more whole-grain foods, fish, poultry, and nuts Remain active and exercise as tolerated daily.  Monitor BP at home-Call if greater than 130/80.   Aortic atherosclerosis (HCC) by Abd CT scan on 05/10/2021 Discussed lifestyle modifications. Recommended diet heavy in fruits and veggies, omega 3's. Decrease consumption of animal meats, cheeses, and dairy products. Remain active and exercise as tolerated. Continue to monitor.  Nodule of lower lobe of right lung Hematology Dr. Bertis Ruddy has followed - with progressive ovarian cancer. Last CT completed 04/23/2020 Impression:  There are multiple small subpleural nodules at the right lung base overlying the diaphragm, measuring up to 7 mm. These are generally nonspecific and less favored to represent pulmonary metastatic disease given distribution. Attention on follow-up.  Poor dentition Continue to follow with Dentist.  CKD stage 3 due to type 2 diabetes mellitus (HCC) Discussed how what you eat and drink can aide in kidney protection. Stay well hydrated. Avoid high salt foods. Avoid NSAIDS. Keep BP and BG well controlled.   Take medications as prescribed. Remain active and exercise as tolerated daily. Maintain weight.  Continue to monitor. Education: Reviewed 'ABCs' of diabetes management  A1C (<7):  Goal Met. Blood pressure (<130/80):  Goal Met Cholesterol (LDL <70):  Goal Met Continue Eye Exam yearly  Continue Dental Exam Q6 mo Discussed dietary recommendations Discussed Physical Activity recommendations Foot exam UTD  Malignant neoplasm of ovary, unspecified laterality (HCC) Followed  with Hematology Dr. Bertis Ruddy  Disease progression was reviewed in length She is not too symptomatic. Moving forward, supportive of disease progression Dr. Bertis Ruddy is reluctant to recommend her to continue on current regimen. She has pr-existing severe peripheral neuropathy and risks of moving forward could exacerbate this as well as cardiomyopathy for any exposure to Adriamycin.  With topotecan, there is a risk for severe pancytopenia which increase r/f infection and reduced QOL. Recommendation to consider second opinion at [facility]. She plans to reach out to Dr. Pricilla Holm or Dr. Andrey Farmer and is continuing care through them   Hyperlipidemia associated with type 2 diabetes mellitus (HCC) Discussed lifestyle modifications. Recommended diet heavy in fruits and veggies, omega 3's. Decrease consumption of animal meats, cheeses, and dairy products. Remain active and exercise as tolerated. Continue to monitor.  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 Continue to monitor  Osteopenia Last DEXA 10/2021. Increase exercise, high calcium diet, continue vitamin D suppement  Pancytopenia, acquired (HCC) Hem/onc following closely; Check CBC  Vitamin D deficiency Continue supplement. Continue to monitor  History of left breast cancer Continue to follow with Hem/Onc.  Medication management All medications discussed and reviewed in full. All questions and concerns regarding medications addressed.    Vaginal bleeding Monitor CBC and iron panel  Orders Placed This Encounter  Procedures   CBC with Differential/Platelet   COMPLETE METABOLIC PANEL WITH GFR   Iron, TIBC and Ferritin Panel    Notify office for further evaluation and treatment, questions or  concerns if any reported s/s fail to improve.   The patient was advised  to call back or seek an in-person evaluation if any symptoms worsen or if the condition fails to improve as anticipated.   Further disposition pending results of labs. Discussed med's effects and SE's.    I discussed the assessment and treatment plan with the patient. The patient was provided an opportunity to ask questions and all were answered. The patient agreed with the plan and demonstrated an understanding of the instructions.  Discussed med's effects and SE's. Screening labs and tests as requested with regular follow-up as recommended.  I provided 35 minutes of face-to-face time during this encounter including counseling, chart review, and critical decision making was preformed.  Today's Plan of Care is based on a patient-centered health care approach known as shared decision making - the decisions, tests and treatments allow for patient preferences and values to be balanced with clinical evidence.     Future Appointments  Date Time Provider Department Center  08/18/2023  9:45 AM Daiva Eves, Lisette Grinder, MD [facility]  03/15/2024  2:00 PM Adela Glimpse, NP [facility] None  06/24/2024 11:30 AM Adela Glimpse, NP [facility] None    Plan:   During the course of the visit the patient was educated and counseled about appropriate screening and preventive services including:   Pneumococcal vaccine  Influenza vaccine Td vaccine Prevnar 13 Screening electrocardiogram Screening mammography Bone densitometry screening Colorectal cancer screening Diabetes screening Glaucoma screening Nutrition counseling  Advanced directives: given info/requested copies    Subjective:   Kayla Baker is a 87 y.o. female who presents for AWV and follow up. 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); Osteopenia; Aortic atherosclerosis (HCC) by Abd CT scan on 05/10/2021; Nodule of lower lobe of right lung; Poor dentition; Adhesive capsulitis of right shoulder; Postoperative breast asymmetry; Hypomagnesemia; Stage 3b chronic kidney disease (CKD) (HCC); Neutropenia (HCC); Generalized weakness; Acute pancreatitis; History of therapeutic radiation; Hypokalemia; Hyperbilirubinemia; Normocytic anemia; Thrombocytopenia (HCC); Protein-calorie malnutrition, severe (HCC); Right hip pain; Urinary tract infection without hematuria; Pancytopenia (HCC); Malnutrition of moderate degree; Prosthetic hip infection (HCC); Allergy to cephalosporin; Palliative care encounter; Sepsis (HCC); Enteritis; Need for emotional support; and MSSA (methicillin susceptible Staphylococcus aureus) infection on their problem list.  She is married, no children. 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 She enjoys spending time with family.   In 2021 she was unfortunately diagnosed with large R sided ovarian tumor with consequent RLE edema, urinary incontinence (wearing depends, improved), had port cath placed and started chemotherapy managed by Dr. Artis Delay, s/p resction by Dr. Adolphus Birchwood at [facility] on 10/18/2020.  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  She has been seen by Dr. Andrey Farmer to move forward with other treatment options and accepts this is now terminal.  She does currently have options provided to her by Dr. Bertis Ruddy including  Bevacizumab injection, Topotecan capsules, Doxorubicin Liposomal injection, or Docetaxel injection. Overall she reports she feels well and in good spirits.   Other significant current problems include hx/o Triple (+) Lt Breast Ca w/ Partial Lt Mastectomy  in  April 2017 followed by Chemo tx 5-07/2016 then Radiation in Oct; she completed 4 years of anastrozole, was d/c'd due to ovarian cancer.   She has home health hospice once a week and care management every two weeks.  [name]   BMI is Body mass index is 22.82 kg/m., she has been working on diet but does not take in much.  Eats peanut butter crackers, watermelon, ensure. Wt Readings from Last 3 Encounters:  06/25/23 128 lb 12.8 oz (58.4 kg)  06/16/23 128 lb 6.4 oz (58.2 kg)  03/31/23 133 lb (60.3 kg)   Her blood pressure has been controlled at home, today their BP is BP: (!) 100/50 She does not workout. She denies chest pain, shortness of breath, dizziness.   She has aortic atherosclerosis per CT 05/2021.   She is on cholesterol medication (on zetia, hx of LFT elevation with statin, was on rosuvastatin 5 mg three days a week but holding while on chemotherapy per oncology) and denies myalgias. Her cholesterol at goal. The cholesterol last visit was:   Lab Results  Component Value Date   CHOL 123 03/16/2023   HDL 40 (L) 03/16/2023   LDLCALC 62 03/16/2023   TRIG 127 03/16/2023   CHOLHDL 3.1 03/16/2023   She has been working on diet and exercise for T2 diabetes (off of metformin due to CKD, glimepiride 1 mg PRN AM if fasting 150+), and denies foot ulcerations, hyperglycemia, hypoglycemia , increased appetite, nausea, polydipsia, polyuria, visual disturbances, vomiting and weight loss.She reports that her blood sugars have been running 91-121. She does have some mild neuropathy in her feet, on gabapentin.    Last A1C in the office was:  Lab Results  Component Value Date   HGBA1C 5.8  (H) 03/16/2023   CKD III associated with T2DM; followed by Dr. Signe Colt; last GFR:  Lab Results  Component Value Date   GFRNONAA 41 (L) 01/08/2023   GFRNONAA 44 (L) 11/10/2022   GFRNONAA 43 (L) 11/09/2022   Patient is on Vitamin D supplement, taking 5000 IU daily. Lab Results  Component Value Date   VD25OH 98 03/16/2023     Patient is on allopurinol for gout and does not report a recent flare.  Lab Results  Component Value Date   LABURIC 6.4 08/05/2022   Hem/onc and nephrology also following chronic anemia closely; had normal iron 10/2020, B12 05/2020.     Latest Ref Rng & Units 03/31/2023   11:17 AM 03/16/2023    2:47 PM 01/08/2023    5:50 PM  CBC  WBC 3.8 - 10.8 Thousand/uL 2.8  3.8  3.0   Hemoglobin 11.7 - 15.5 g/dL 16.1  09.6  04.5   Hematocrit 35.0 - 45.0 % 34.4  35.3  33.2   Platelets 140 - 400 Thousand/uL 96  92  89      Medication Review  Current Outpatient Medications (Endocrine & Metabolic):    glimepiride (AMARYL) 1 MG tablet, Take 1 tab by mouth in the morning if fasting is running 150+. Please do not take this medication if you are ill or not eating as this can cause a low blood sugar.  Current Outpatient Medications (Cardiovascular):    ezetimibe (ZETIA) 10 MG tablet, TAKE 1 TABLET BY MOUTH DAILY FOR CHOLESTEROL   furosemide (LASIX) 20 MG tablet, Take 20 mg by mouth daily.   rosuvastatin (CRESTOR) 5 MG tablet, Take 1 tablet (5 mg total) by mouth 3 (three) times a  week.   hydrochlorothiazide (MICROZIDE) 12.5 MG capsule, Take 1 capsule (12.5 mg total) by mouth daily.  Current Outpatient Medications (Respiratory):    loratadine (CLARITIN) 10 MG tablet, Take by mouth.  Current Outpatient Medications (Analgesics):    acetaminophen (TYLENOL) 500 MG tablet, Take 1,000 mg by mouth every 6 (six) hours as needed for mild pain, headache or fever.   oxyCODONE (OXY IR/ROXICODONE) 5 MG immediate release tablet, Take 1 tablet (5 mg total) by mouth every 6 (six) hours as needed  for severe pain or moderate pain.   Current Outpatient Medications (Other):    cefadroxil (DURICEF) 500 MG capsule, Take 2 capsules (1,000 mg total) by mouth 2 (two) times daily.   Ensure Max Protein (ENSURE MAX PROTEIN) LIQD, Take 330 mLs (11 oz total) by mouth 2 (two) times daily.   gabapentin (NEURONTIN) 300 MG capsule, Take 300 mg by mouth 4 (four) times daily.   glucose blood test strip, Use as instructed   Lancets (FREESTYLE) lancets, USE TO CHECK BLOOD GLUCOSE ONCE DAILY AS DIRECTED   lidocaine-prilocaine (EMLA) cream, Apply 1 application. topically daily as needed (for port access).   magnesium oxide (MAG-OX) 400 (240 Mg) MG tablet, TAKE 1 TABLET(400 MG) BY MOUTH DAILY   ondansetron (ZOFRAN) 8 MG tablet, Take 1 tablet (8 mg total) by mouth every 8 (eight) hours as needed for refractory nausea / vomiting.   pantoprazole (PROTONIX) 40 MG tablet, TAKE 1 TABLET(40 MG) BY MOUTH DAILY   polyethylene glycol (MIRALAX) 17 g packet, Take 17 g by mouth 2 (two) times daily. 17 grams in 6 oz of favorite drink twice a day until bowel movement.  LAXITIVE.  Restart if two days since last bowel movement (Patient taking differently: Take 17 g by mouth daily as needed for mild constipation.)   trolamine salicylate (ASPERCREME) 10 % cream, Apply 1 application  topically as needed (to both feet for neuropathic pain).   vitamin C (ASCORBIC ACID) 500 MG tablet, Take 500 mg by mouth every evening.  (Patient not taking: Reported on 06/25/2023)  Current Problems (verified) Patient Active Problem List   Diagnosis Date Noted   MSSA (methicillin susceptible Staphylococcus aureus) infection 01/29/2023   Need for emotional support 11/08/2022   Enteritis 11/07/2022   Sepsis (HCC) 11/06/2022   Palliative care encounter 10/08/2022   Allergy to cephalosporin    Malnutrition of moderate degree 08/28/2022   Prosthetic hip infection (HCC) 08/28/2022   Urinary tract infection without hematuria    Pancytopenia (HCC)     Hypokalemia 08/24/2022   Hyperbilirubinemia 08/24/2022   Normocytic anemia 08/24/2022   Thrombocytopenia (HCC) 08/24/2022   Protein-calorie malnutrition, severe (HCC) 08/24/2022   Right hip pain 08/24/2022   Acute pancreatitis 08/09/2022   Stage 3b chronic kidney disease (CKD) (HCC) 08/05/2022   Neutropenia (HCC) 08/05/2022   Generalized weakness    Hypomagnesemia 02/22/2022   Poor dentition 07/26/2021   Nodule of lower lobe of right lung 06/27/2021   Aortic atherosclerosis (HCC) by Abd CT scan on 05/10/2021 06/10/2021   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) 02/20/2021   Osteopenia 02/20/2021   Pancytopenia, acquired (HCC) 05/15/2020   Cancer associated pain 05/15/2020   Other ascites 05/07/2020   Anemia in neoplastic disease 05/03/2020   Ovarian cancer (HCC) 04/19/2020   Bilateral lower extremity edema 04/05/2020   CKD stage 3 due to type 2 diabetes mellitus (HCC) 07/05/2018   Adhesive capsulitis of right shoulder 04/27/2018  Postoperative breast asymmetry 04/01/2017   History of therapeutic radiation 04/01/2017   Port catheter in place 09/18/2016   Chemotherapy-induced peripheral neuropathy (HCC) 08/07/2016   History of left breast cancer 02/29/2016   Medication management 08/27/2014   Essential hypertension 11/10/2013   Hyperlipidemia associated with type 2 diabetes mellitus (HCC) 11/10/2013   DM2 (diabetes mellitus, type 2) (HCC) 11/10/2013   Vitamin D deficiency 11/10/2013    Screening Tests Immunization History  Administered Date(s) Administered   COVID-19, mRNA, vaccine(Comirnaty)12 years and older 10/30/2022, 03/31/2023   DTaP 07/01/2004   Fluad Quad(high Dose 65+) 10/30/2022   Influenza Whole 08/15/2013   Influenza, High Dose Seasonal PF 08/28/2014, 09/20/2015, 08/19/2017, 09/01/2018, 07/28/2019   Influenza-Unspecified 08/04/2020, 08/15/2021   PFIZER(Purple Top)SARS-COV-2  Vaccination 12/12/2019, 12/31/2019, 08/03/2020, 06/11/2021   Pneumococcal Conjugate-13 09/20/2015   Pneumococcal Polysaccharide-23 11/01/2009   Td 08/10/2009, 04/11/2020   Tdap 11/29/2022   Zoster, Live 08/02/2011    Preventative care: Last colonoscopy: 2019 will not need another Last mammogram: Completed 03/2022 Last pap smear/pelvic exam: remotely DEXA: 10/16/2021 - Due 2024  Prior vaccinations: TD or Tdap: 03/2020 Influenza: 08/25/2021  Pneumococcal: 2010 Prevnar13: 2016  Shingles/Zostavax: 2012 discussed will get eventually, had first shingrix was advised not to get the second by oncologist  COVID complete 2021  Names of Other Physician/Practitioners you currently use: 1. [facility]- here for primary care 2. Dr. Lorin Picket, eye doctor, last visit 09/18/2022 - bil mild retinopathy last year, report requested 3. Dr. Ethelle Lyon, dentist, last visit 2023 -  plans to visit soon.  Patient Care Team: Lucky Cowboy, MD as PCP - Baird Lyons, OD as Referring Physician (Optometry) Campbell Stall, MD as Consulting Physician (Dermatology) Ranee Gosselin, MD as Consulting Physician (Orthopedic Surgery) Louis Meckel, MD (Inactive) as Consulting Physician (Gastroenterology) Artis Delay, MD as Consulting Physician (Hematology and Oncology) Bufford Buttner, MD as Consulting Physician (Nephrology) Sundra Aland, [facility] (Inactive) (Pharmacist)  Allergies Allergies  Allergen Reactions   Mobic [Meloxicam] Swelling   Prednisone Swelling and Other (See Comments)    Can Not take High doses   Premarin [Estrogens Conjugated] Hives   Trovan [Alatrofloxacin] Other (See Comments)    "I don't tolerate this well"    SURGICAL HISTORY She  has a past surgical history that includes Cataract extraction, bilateral (2014); Abdominal hysterectomy (1973); Incontinence surgery (2006); Knee arthroscopy (Right); Hand surgery (Left, 2012); Colonoscopy; Radioactive seed  guided mastectomy with axillary sentinel lymph node biopsy (Left, 03/18/2016); Cardiac catheterization; Portacath placement (N/A, 04/14/2016); Port-a-cath removal (N/A, 11/27/2016); Mastopexy (Right, 06/16/2017); Breast biopsy (Left, 02/26/2016); Breast lumpectomy (Left, 03/18/2016); Reduction mammaplasty (Right, 2018); IR IMAGING GUIDED PORT INSERTION (04/24/2020); IR Paracentesis (05/08/2020); IR Paracentesis (05/16/2020); IR Paracentesis (05/25/2020); Hip Arthroplasty (Right, 11/08/2021); and Total hip arthroplasty (Right, 08/29/2022).   FAMILY HISTORY Her family history includes Alzheimer's disease in her father; Breast cancer (age of onset: 54) in her sister; Cirrhosis in her sister; Diabetes in her father; Epilepsy in some other family members; Esophageal cancer (age of onset: 48) in her maternal aunt; Heart Problems in her maternal grandmother; Heart attack (age of onset: 38) in her maternal uncle; Hypertension in her sister; Lung cancer in an other family member; Lung cancer (age of onset: 4) in her sister; Other in her mother and sister; Other (age of onset: 19) in an other family member; Pancreatic cancer (age of onset: 39) in her cousin; Parkinson's disease in her maternal aunt; Stroke in her maternal grandmother; Stroke (age of onset: 44) in her  mother.   SOCIAL HISTORY She  reports that she has never smoked. She has never used smokeless tobacco. She reports that she does not drink alcohol and does not use drugs.  MEDICARE WELLNESS OBJECTIVES: Physical activity: Current Exercise Habits: The patient does not participate in regular exercise at present Cardiac risk factors:   Depression/mood screen:      06/25/2023   12:56 PM  Depression screen PHQ 2/9  Decreased Interest 0  Down, Depressed, Hopeless 0  PHQ - 2 Score 0    ADLs:     06/25/2023   12:56 PM 11/08/2022    2:00 PM  In your present state of health, do you have any difficulty performing the following activities:  Hearing? 0   Vision?  0   Difficulty concentrating or making decisions? 0   Walking or climbing stairs? 1   Comment Uses 4 prong walker   Dressing or bathing? 0   Doing errands, shopping? 0 0     Cognitive Testing  Alert? Yes  Normal Appearance?Yes  Oriented to person? Yes  Place? Yes   Time? Yes  Recall of three objects?  Yes  Can perform simple calculations? Yes  Displays appropriate judgment?Yes  Can read the correct time from a watch face?Yes  EOL planning: Does Patient Have a Medical Advance Directive?: No     Review of Systems  Constitutional:  Negative for malaise/fatigue and weight loss.  HENT:  Negative for hearing loss and tinnitus.   Eyes:  Negative for blurred vision and double vision.  Respiratory:  Negative for cough, sputum production, shortness of breath and wheezing.   Cardiovascular:  Negative for chest pain, palpitations, orthopnea, claudication, leg swelling and PND.  Gastrointestinal:  Negative for abdominal pain, blood in stool, constipation, diarrhea, heartburn, melena, nausea and vomiting.  Genitourinary: Negative.   Musculoskeletal:  Negative for falls, joint pain and myalgias.  Skin:  Negative for rash.  Neurological:  Positive for sensory change (bil feet/ankles, chronic following chemo). Negative for dizziness, tingling, weakness and headaches.  Endo/Heme/Allergies:  Negative for polydipsia.  Psychiatric/Behavioral: Negative.  Negative for depression, memory loss, substance abuse and suicidal ideas. The patient is not nervous/anxious and does not have insomnia.   All other systems reviewed and are negative.    Objective:   Today's Vitals   06/25/23 1128  BP: (!) 100/50  Pulse: 73  Temp: (!) 97.4 F (36.3 C)  SpO2: 98%  Weight: 128 lb 12.8 oz (58.4 kg)  Height: 5\' 3"  (1.6 m)   Body mass index is 22.82 kg/m.  General appearance: alert, no distress, WD/WN, elder female HEENT: normocephalic, sclerae anicteric, TMs pearly, nares patent, no discharge or  erythema, pharynx normal Oral cavity: MMM, no lesions Neck: supple, no lymphadenopathy, no thyromegaly, no masses Heart: RRR, normal S1, S2, no murmurs Lungs: CTA bilaterally, no wheezes, rhonchi, or rales Abdomen: +bs, soft, non tender, non distended, no masses, no hepatomegaly, no splenomegaly Musculoskeletal: nontender, no swelling, no obvious deformity Extremities: LLE without edema, RLE with scant swelling around ankle, no cyanosis, no clubbing Pulses: 2+ symmetric, upper and lower extremities, normal cap refill Neurological: alert, oriented x 3, CN2-12 intact, strength normal upper extremities and lower extremities, sensation normal UE, bil LE diminished through feet and ankles to monofilament and light touch, DTRs 2+ throughout, no cerebellar signs, gait slow steady Psychiatric: normal affect, behavior normal, pleasant  Gyn: defer to GYN/onc   Medicare Attestation I have personally reviewed: The patient's medical and social history Their use of  alcohol, tobacco or illicit drugs Their current medications and supplements The patient's functional ability including ADLs,fall risks, home safety risks, cognitive, and hearing and visual impairment Diet and physical activities Evidence for depression or mood disorders  The patient's weight, height, BMI, and visual acuity have been recorded in the chart.  I have made referrals, counseling, and provided education to the patient based on review of the above and I have provided the patient with a written personalized care plan for preventive services.     Adela Glimpse, NP   06/25/2023

## 2023-06-26 LAB — COMPLETE METABOLIC PANEL WITH GFR: Glucose, Bld: 114 mg/dL — ABNORMAL HIGH (ref 65–99)

## 2023-08-17 ENCOUNTER — Telehealth: Payer: Self-pay | Admitting: Infectious Disease

## 2023-08-17 NOTE — Telephone Encounter (Signed)
Lorma's husband, [name], requested to cancel her upcoming appointment due to her health. He stated he is considering Hospice but wanted Dr. Daiva Eves to know she will most likely not be returning to his care.

## 2023-08-18 ENCOUNTER — Ambulatory Visit: Payer: PPO | Admitting: Infectious Disease

## 2023-09-01 DEATH — deceased

## 2023-09-18 ENCOUNTER — Encounter: Payer: Self-pay | Admitting: Hematology and Oncology

## 2023-09-18 NOTE — Telephone Encounter (Signed)
TC

## 2024-02-25 ENCOUNTER — Encounter: Payer: PPO | Admitting: Nurse Practitioner

## 2024-03-15 ENCOUNTER — Encounter: Payer: PPO | Admitting: Nurse Practitioner

## 2024-06-24 ENCOUNTER — Ambulatory Visit: Payer: PPO | Admitting: Nurse Practitioner
